# Patient Record
Sex: Female | Born: 1950 | Race: Black or African American | Hispanic: No | Smoking: Former smoker
Health system: Southern US, Community
[De-identification: ages and names within clinical notes are randomized; demographics above are authoritative.]

## PROBLEM LIST (undated history)

## (undated) ENCOUNTER — Emergency Department (HOSPITAL_COMMUNITY): Discharge status: Absent without leave | Payer: Medicare Other

## (undated) DIAGNOSIS — M542 Cervicalgia: Secondary | ICD-10-CM

## (undated) DIAGNOSIS — J449 Chronic obstructive pulmonary disease, unspecified: Secondary | ICD-10-CM

## (undated) DIAGNOSIS — I5033 Acute on chronic diastolic (congestive) heart failure: Secondary | ICD-10-CM

## (undated) DIAGNOSIS — E119 Type 2 diabetes mellitus without complications: Secondary | ICD-10-CM

## (undated) DIAGNOSIS — D649 Anemia, unspecified: Secondary | ICD-10-CM

## (undated) DIAGNOSIS — I313 Pericardial effusion (noninflammatory): Secondary | ICD-10-CM

## (undated) DIAGNOSIS — IMO0001 Reserved for inherently not codable concepts without codable children: Secondary | ICD-10-CM

## (undated) DIAGNOSIS — N189 Chronic kidney disease, unspecified: Secondary | ICD-10-CM

## (undated) DIAGNOSIS — I2510 Atherosclerotic heart disease of native coronary artery without angina pectoris: Secondary | ICD-10-CM

## (undated) DIAGNOSIS — E785 Hyperlipidemia, unspecified: Secondary | ICD-10-CM

## (undated) DIAGNOSIS — G8929 Other chronic pain: Secondary | ICD-10-CM

## (undated) DIAGNOSIS — I739 Peripheral vascular disease, unspecified: Secondary | ICD-10-CM

## (undated) DIAGNOSIS — E559 Vitamin D deficiency, unspecified: Secondary | ICD-10-CM

## (undated) DIAGNOSIS — K5790 Diverticulosis of intestine, part unspecified, without perforation or abscess without bleeding: Secondary | ICD-10-CM

## (undated) DIAGNOSIS — I639 Cerebral infarction, unspecified: Secondary | ICD-10-CM

## (undated) DIAGNOSIS — M1990 Unspecified osteoarthritis, unspecified site: Secondary | ICD-10-CM

## (undated) DIAGNOSIS — E1140 Type 2 diabetes mellitus with diabetic neuropathy, unspecified: Secondary | ICD-10-CM

## (undated) DIAGNOSIS — K222 Esophageal obstruction: Secondary | ICD-10-CM

## (undated) DIAGNOSIS — F419 Anxiety disorder, unspecified: Secondary | ICD-10-CM

## (undated) DIAGNOSIS — I10 Essential (primary) hypertension: Secondary | ICD-10-CM

## (undated) DIAGNOSIS — K219 Gastro-esophageal reflux disease without esophagitis: Secondary | ICD-10-CM

## (undated) DIAGNOSIS — Z794 Long term (current) use of insulin: Secondary | ICD-10-CM

## (undated) HISTORY — DX: Hyperlipidemia, unspecified: E78.5

## (undated) HISTORY — PX: OTHER SURGICAL HISTORY: SHX169

## (undated) HISTORY — PX: BACK SURGERY: SHX140

## (undated) HISTORY — PX: CARDIAC CATHETERIZATION: SHX172

## (undated) HISTORY — DX: Atherosclerotic heart disease of native coronary artery without angina pectoris: I25.10

## (undated) HISTORY — PX: PARTIAL HYSTERECTOMY: SHX80

## (undated) HISTORY — DX: Esophageal obstruction: K22.2

## (undated) HISTORY — PX: SHOULDER SURGERY: SHX246

## (undated) HISTORY — DX: Chronic obstructive pulmonary disease, unspecified: J44.9

## (undated) HISTORY — PX: ABDOMINAL HYSTERECTOMY: SHX81

## (undated) HISTORY — DX: Peripheral vascular disease, unspecified: I73.9

## (undated) HISTORY — PX: BLADDER SURGERY: SHX569

## (undated) HISTORY — DX: Essential (primary) hypertension: I10

---

## 1998-07-15 ENCOUNTER — Inpatient Hospital Stay (HOSPITAL_COMMUNITY): Admission: AD | Admit: 1998-07-15 | Discharge: 1998-07-17 | Payer: Self-pay | Admitting: Gastroenterology

## 1998-09-03 ENCOUNTER — Ambulatory Visit (HOSPITAL_COMMUNITY): Admission: RE | Admit: 1998-09-03 | Discharge: 1998-09-03 | Payer: Self-pay | Admitting: *Deleted

## 2000-09-11 ENCOUNTER — Encounter: Admission: RE | Admit: 2000-09-11 | Discharge: 2000-09-11 | Payer: Self-pay | Admitting: *Deleted

## 2000-09-11 ENCOUNTER — Encounter: Payer: Self-pay | Admitting: *Deleted

## 2000-09-27 ENCOUNTER — Encounter: Payer: Self-pay | Admitting: *Deleted

## 2000-09-27 ENCOUNTER — Encounter: Admission: RE | Admit: 2000-09-27 | Discharge: 2000-09-27 | Payer: Self-pay | Admitting: *Deleted

## 2001-12-12 ENCOUNTER — Other Ambulatory Visit: Admission: RE | Admit: 2001-12-12 | Discharge: 2001-12-12 | Payer: Self-pay | Admitting: *Deleted

## 2002-03-06 ENCOUNTER — Inpatient Hospital Stay (HOSPITAL_COMMUNITY): Admission: EM | Admit: 2002-03-06 | Discharge: 2002-03-11 | Payer: Self-pay | Admitting: Emergency Medicine

## 2002-03-06 ENCOUNTER — Encounter: Payer: Self-pay | Admitting: Cardiology

## 2002-03-08 ENCOUNTER — Encounter: Payer: Self-pay | Admitting: Cardiology

## 2002-03-10 ENCOUNTER — Encounter: Payer: Self-pay | Admitting: Cardiology

## 2002-04-22 ENCOUNTER — Ambulatory Visit (HOSPITAL_COMMUNITY): Admission: RE | Admit: 2002-04-22 | Discharge: 2002-04-23 | Payer: Self-pay | Admitting: *Deleted

## 2003-04-07 ENCOUNTER — Other Ambulatory Visit: Admission: RE | Admit: 2003-04-07 | Discharge: 2003-04-07 | Payer: Self-pay | Admitting: Family Medicine

## 2003-04-20 ENCOUNTER — Encounter: Payer: Self-pay | Admitting: Family Medicine

## 2003-04-20 ENCOUNTER — Encounter: Admission: RE | Admit: 2003-04-20 | Discharge: 2003-04-20 | Payer: Self-pay | Admitting: Family Medicine

## 2003-06-02 ENCOUNTER — Ambulatory Visit (HOSPITAL_COMMUNITY): Admission: RE | Admit: 2003-06-02 | Discharge: 2003-06-02 | Payer: Self-pay | Admitting: Gastroenterology

## 2004-10-19 ENCOUNTER — Ambulatory Visit: Payer: Self-pay | Admitting: Cardiology

## 2004-11-07 ENCOUNTER — Ambulatory Visit: Payer: Self-pay

## 2004-11-16 ENCOUNTER — Ambulatory Visit: Payer: Self-pay | Admitting: Cardiology

## 2004-11-29 ENCOUNTER — Ambulatory Visit: Payer: Self-pay | Admitting: Internal Medicine

## 2004-11-29 ENCOUNTER — Inpatient Hospital Stay (HOSPITAL_BASED_OUTPATIENT_CLINIC_OR_DEPARTMENT_OTHER): Admission: RE | Admit: 2004-11-29 | Discharge: 2004-11-29 | Payer: Self-pay | Admitting: Cardiology

## 2004-12-12 ENCOUNTER — Ambulatory Visit: Payer: Self-pay | Admitting: Cardiology

## 2005-02-10 ENCOUNTER — Ambulatory Visit: Payer: Self-pay | Admitting: Cardiology

## 2005-09-21 ENCOUNTER — Ambulatory Visit: Payer: Self-pay | Admitting: Cardiology

## 2006-07-04 ENCOUNTER — Ambulatory Visit: Payer: Self-pay | Admitting: Cardiology

## 2006-07-11 ENCOUNTER — Ambulatory Visit: Payer: Self-pay

## 2006-08-03 ENCOUNTER — Inpatient Hospital Stay (HOSPITAL_COMMUNITY): Admission: RE | Admit: 2006-08-03 | Discharge: 2006-08-04 | Payer: Self-pay | Admitting: Specialist

## 2006-09-10 ENCOUNTER — Encounter: Admission: RE | Admit: 2006-09-10 | Discharge: 2006-12-09 | Payer: Self-pay | Admitting: Specialist

## 2008-06-16 ENCOUNTER — Ambulatory Visit: Payer: Self-pay | Admitting: Cardiology

## 2008-06-16 ENCOUNTER — Inpatient Hospital Stay (HOSPITAL_COMMUNITY): Admission: EM | Admit: 2008-06-16 | Discharge: 2008-06-19 | Payer: Self-pay | Admitting: Emergency Medicine

## 2008-06-18 ENCOUNTER — Encounter: Payer: Self-pay | Admitting: Cardiology

## 2008-07-01 ENCOUNTER — Ambulatory Visit: Payer: Self-pay | Admitting: Cardiology

## 2009-02-04 ENCOUNTER — Encounter: Admission: RE | Admit: 2009-02-04 | Discharge: 2009-02-04 | Payer: Self-pay | Admitting: Family Medicine

## 2009-04-19 DIAGNOSIS — I25118 Atherosclerotic heart disease of native coronary artery with other forms of angina pectoris: Secondary | ICD-10-CM | POA: Insufficient documentation

## 2009-04-19 DIAGNOSIS — E1169 Type 2 diabetes mellitus with other specified complication: Secondary | ICD-10-CM | POA: Insufficient documentation

## 2009-04-19 DIAGNOSIS — E1121 Type 2 diabetes mellitus with diabetic nephropathy: Secondary | ICD-10-CM | POA: Insufficient documentation

## 2009-04-19 DIAGNOSIS — E785 Hyperlipidemia, unspecified: Secondary | ICD-10-CM

## 2009-04-19 DIAGNOSIS — E1159 Type 2 diabetes mellitus with other circulatory complications: Secondary | ICD-10-CM | POA: Insufficient documentation

## 2009-04-19 DIAGNOSIS — I10 Essential (primary) hypertension: Secondary | ICD-10-CM

## 2009-04-19 DIAGNOSIS — I2510 Atherosclerotic heart disease of native coronary artery without angina pectoris: Secondary | ICD-10-CM | POA: Insufficient documentation

## 2009-04-21 ENCOUNTER — Ambulatory Visit: Payer: Self-pay | Admitting: Cardiology

## 2009-08-11 ENCOUNTER — Encounter (INDEPENDENT_AMBULATORY_CARE_PROVIDER_SITE_OTHER): Payer: Self-pay | Admitting: *Deleted

## 2010-06-18 ENCOUNTER — Encounter
Admission: RE | Admit: 2010-06-18 | Discharge: 2010-06-18 | Payer: Self-pay | Admitting: Physical Medicine and Rehabilitation

## 2010-12-04 HISTORY — PX: CERVICAL SPINE SURGERY: SHX589

## 2010-12-21 ENCOUNTER — Encounter: Payer: Self-pay | Admitting: Cardiology

## 2010-12-21 ENCOUNTER — Ambulatory Visit
Admission: RE | Admit: 2010-12-21 | Discharge: 2010-12-21 | Payer: Self-pay | Source: Home / Self Care | Attending: Cardiology | Admitting: Cardiology

## 2010-12-26 ENCOUNTER — Encounter: Payer: Self-pay | Admitting: Cardiology

## 2010-12-26 ENCOUNTER — Ambulatory Visit: Admit: 2010-12-26 | Payer: Self-pay | Admitting: Vascular Surgery

## 2010-12-29 ENCOUNTER — Telehealth (INDEPENDENT_AMBULATORY_CARE_PROVIDER_SITE_OTHER): Payer: Self-pay | Admitting: *Deleted

## 2011-01-02 ENCOUNTER — Encounter: Payer: Self-pay | Admitting: Cardiology

## 2011-01-02 ENCOUNTER — Encounter: Payer: Self-pay | Admitting: *Deleted

## 2011-01-02 ENCOUNTER — Ambulatory Visit: Admission: RE | Admit: 2011-01-02 | Discharge: 2011-01-02 | Payer: Self-pay | Source: Home / Self Care

## 2011-01-02 ENCOUNTER — Encounter (HOSPITAL_COMMUNITY)
Admission: RE | Admit: 2011-01-02 | Discharge: 2011-01-03 | Payer: Self-pay | Source: Home / Self Care | Attending: Cardiology | Admitting: Cardiology

## 2011-01-04 ENCOUNTER — Encounter (INDEPENDENT_AMBULATORY_CARE_PROVIDER_SITE_OTHER): Payer: BC Managed Care – PPO

## 2011-01-04 DIAGNOSIS — M79609 Pain in unspecified limb: Secondary | ICD-10-CM

## 2011-01-05 NOTE — Assessment & Plan Note (Signed)
Summary: [facility]   Visit Type:  Follow-up Primary Provider:  Haynes Hoehn, NP  CC:  CAD.  History of Present Illness: The patient presents for follow up after having been gone for a couple of years.  In that time she has had no new cardiovascular findings though she has had difficult to control risk factors such as diabetes and dyslipidemia. I reviewed her most recent labs and her primary physicians are aggressively trying to treat these issues. I also note that her last creatinine was up to 1.65. She is now considering back surgery due to repetitive injuries. She still works. With repetitive motions she does notice some chest discomfort. However, she's not having this at rest. She's not having any PND or orthopnea though she does have increasing dyspnea with exertion. She denied having palpitations, presyncope or syncope.  Current Medications (verified): 1)  Nitroglycerin 0.4 Mg Subl (Nitroglycerin) .... As Directed For Chest Pain 2)  Aspirin 81 Mg  Tabs (Aspirin) .[name].. 1 By Mouth Daily 3)  Neurontin 300 Mg Caps (Gabapentin) .[name].. 1 By Mouth Three Times A Day 4)  Vitamin D3 50000 Unit Caps (Cholecalciferol) .[name].. 1 By Mouth Weekly 5)  Diovan Hct 320-25 Mg Tabs (Valsartan-Hydrochlorothiazide) .[name].. 1 By Mouth Daily 6)  Crestor 20 Mg Tabs (Rosuvastatin Calcium) .[name].. 1 By Mouth Daily 7)  Nifedipine 60 Mg Xr24h-Tab (Nifedipine) .[name].. 1 By Mouth Daily 8)  Onglyza 5 Mg Tabs (Saxagliptin Hcl) .[name].. 1 By Mouth Daily 9)  Actos 30 Mg Tabs (Pioglitazone Hcl) .[name].. 1 By Mouth Daily 10)  Atenolol 50 Mg Tabs (Atenolol) .[name].. 1 By Mouth Daily 11)  Glimepiride 4 Mg Tabs (Glimepiride) .[name].. 1 By Mouth Daily  Allergies (verified): 1)  ! Penicillin  Past History:  Past Medical History:  1. Coronary artery disease  (Stenting in 2005.  2009 cardiac catheterization.  This demonstrated questionable mitral regurgitation. Left main was free of critical disease, the LAD had mild plaquing, circumflex had  some mild luminal irregularities, there is ramus intermediate with 40% stenosis prior to the stent.  The stent itself was widely patent, the right coronary artery had 40% proximal stenosis. )  2. Esophageal stricture.   3. Non-insulin-dependent diabetes mellitus.   4. Hyperlipidemia.   5. Hypertension.   6. Bronchitis.   Past Surgical History: 1. Cardiac catheterization. 2. Bladder tack. 3. Partial hysterectomy. 4. Right shoulder surgery  Review of Systems       As stated in the HPI and negative for all other systems.   Vital Signs:  Patient profile:   60 year old female Height:      64 inches Weight:      200 pounds BMI:     34.45 Pulse rate:   59 / minute Resp:     16 per minute BP sitting:   126 / 66  (right arm)  Vitals Entered By: Levora Angel, CNA (December 21, 2010 3:57 PM)  Physical Exam  General:  Well developed, well nourished, in no acute distress. Head:  normocephalic and atraumatic Eyes:  PERRLA/EOM intact; conjunctiva and lids normal. Mouth:  Teeth, gums and palate normal. Oral mucosa normal. Neck:  Neck supple, no JVD. No masses, thyromegaly or abnormal cervical nodes. Chest Wall:  no deformities or breast masses noted Lungs:  Clear bilaterally to auscultation and percussion. Abdomen:  Bowel sounds positive; abdomen soft and non-tender without masses, organomegaly, or hernias noted. No hepatosplenomegaly. Msk:  Back normal, normal gait. Muscle strength and tone normal. Extremities:  No clubbing  or cyanosis. Neurologic:  Alert and oriented x 3. Skin:  Intact without lesions or rashes. Cervical Nodes:  no significant adenopathy Axillary Nodes:  no significant adenopathy Inguinal Nodes:  no significant adenopathy Psych:  Normal affect.   Detailed Cardiovascular Exam  Neck    Carotids: Carotids full and equal bilaterally without bruits.      Neck Veins: Normal, no JVD.    Heart    Inspection: no deformities or lifts noted.      Palpation: normal  PMI with no thrills palpable.      Auscultation: regular rate and rhythm, S1, S2 without murmurs, rubs, gallops, or clicks.    Vascular    Abdominal Aorta: no palpable masses, pulsations, or audible bruits.      Femoral Pulses: normal femoral pulses bilaterally.      Pedal Pulses: normal pedal pulses bilaterally.      Radial Pulses: normal radial pulses bilaterally.      Peripheral Circulation: no clubbing, cyanosis, or edema noted with normal capillary refill.     EKG  Procedure date:  12/21/2010  Findings:      NSR, rate 59. axis WNL, no acute ST T wave changes.  Impression & Recommendations:  Problem # 1:  CAD (ICD-414.00) The patient has a low functional level and uncontrolled risk factors.  She will be having back surgery.  Given this she needs stress testing.  She would not be able to walk on a treadmill and so she will have pharmacologic testing. Orders: EKG w/ Interpretation (93000) Nuclear Stress Test (Nuc Stress   Problem # 2:  HYPERTENSION, BENIGN (ICD-401.1) Her blood pressure is controlled and she will continue the meds as listed.  Problem # 3:  HYPERLIPIDEMIA (ICD-272.4) I reviewed her lipid profile.  This is not yet at target but it is being treated aggressively by her primary MD.  Other Orders: Nuclear Stress Test (Nuc Stress Test)  Patient Instructions: 1)  Your physician recommends that you schedule a follow-up appointment in: 1 yr with Dr Percival Spanish 2)  Your physician recommends that you continue on your current medications as directed. Please refer to the Current Medication list given to you today. 3)  Your physician has requested that you have an adenosine myoview.  For further information please visit [email or web address].  Please follow instruction sheet, as given.

## 2011-01-05 NOTE — Progress Notes (Signed)
Summary: Nuclear PRE PROCEDURE  Phone Note Outgoing Call   Call placed by: Loma Newton Summary of Call: Reviewed information on Myoview Information Sheet (see scanned document for further details).  Spoke with patient.     Nuclear Med Background Indications for Stress Test: Evaluation for Ischemia, Surgical Clearance, Stent Patency  Indications Comments: Pending back surgery  History: COPD, Echo, Heart Catheterization, Myocardial Perfusion Study, Stents  History Comments: '03 Stents 08/07 MPS NL EF 62% '09 Heart Cath Patent stent, mod MR '09 ECHO EF 55-65%  Symptoms: Chest Pain with Exertion, DOE    Nuclear Pre-Procedure Cardiac Risk Factors: Family History - CAD, History of Smoking, Hypertension, Lipids, NIDDM, Obesity, TIA Height (in): 49  Nuclear Med Study Referring MD:  J.Hochrein

## 2011-01-11 NOTE — Assessment & Plan Note (Signed)
Summary: Cardiology Nuclear Testing  Nuclear Med Background Indications for Stress Test: Evaluation for Ischemia, Surgical Clearance, Stent Patency  Indications Comments: Pending back or neck surgery by Dr. Leeroy Cha (Fax# [phone])  History: COPD, Echo, Heart Catheterization, Myocardial Perfusion Study, Stents  History Comments: '03 Stent-RI, OM-3; '07 [id number], EF=62%; '09 Cath:Patent stent, moderate MR; '09 Echo:EF=55-65%  Symptoms: Chest Tightness, Diaphoresis, DOE, Fatigue, Palpitations  Symptoms Comments: Last episode of [id number] week.   Nuclear Pre-Procedure Cardiac Risk Factors: Family History - CAD, History of Smoking, Hypertension, IDDM Type 2, Lipids, Obesity, TIA Caffeine/Decaff Intake: None NPO After: 8:00 PM Lungs: Clear.  O2 Sat 98% on RA. IV 0.9% NS with Angio Cath: 20g     IV Site: R Antecubital IV Started by: Irven Baltimore, RN Chest Size (in) 42     Cup Size DD     Height (in): 64 Weight (lb): 194 BMI: 33.42 Tech Comments: Held atenolol this am.  Nuclear Med Study 1 or 2 day study:  1 day     Stress Test Type:  Carlton Adam Reading MD:  Darlin Coco, MD     Referring MD:  Minus Breeding, MD Resting Radionuclide:  Technetium 23m Tetrofosmin     Resting Radionuclide Dose:  11 mCi  Stress Radionuclide:  Technetium 31m Tetrofosmin     Stress Radionuclide Dose:  33 mCi   Stress Protocol   Lexiscan: 0.4 mg   Stress Test Technologist:  Valetta Fuller, CMA-N     Nuclear Technologist:  Charlton Amor, CNMT  Rest Procedure  Myocardial perfusion imaging was performed at rest 45 minutes following the intravenous administration of Technetium 69m Tetrofosmin.  Stress Procedure  The patient received IV Lexiscan 0.4 mg over 15-seconds.  Technetium 32m Tetrofosmin injected at 30-seconds.  There were no significant changes with infusion.  She did c/o chest tightness.  Quantitative spect images were obtained after a 45 minute delay.  QPS Raw Data Images:  Normal;  no motion artifact; normal heart/lung ratio. Stress Images:  Normal homogeneous uptake in all areas of the myocardium. Rest Images:  Normal homogeneous uptake in all areas of the myocardium. Subtraction (SDS):  No evidence of ischemia. Transient Ischemic Dilatation:  1.06  (Normal <1.22)  Lung/Heart Ratio:  .42  (Normal <0.45)  Quantitative Gated Spect Images QGS EDV:  96 ml QGS ESV:  32 ml QGS EF:  66 %  Findings Normal nuclear study Clinically Abnormal (chest pain, ST abnormality, hypotension)      Overall Impression  Exercise Capacity: Lexiscan with no exercise. BP Response: Normal blood pressure response. Clinical Symptoms: Mild chest pain/dyspnea. ECG Impression: No significant ST segment change suggestive of ischemia. Overall Impression: Normal stress nuclear study. Overall Impression Comments: No wall motion abnormalities.  Appended Document: Cardiology Nuclear   No evidence of ischemia or infarct.  The patient would be at acceptable risk for the planned surgery.  Appended Document: Cardiology Nuclear Testing pt aware of results.

## 2011-01-13 NOTE — Procedures (Unsigned)
DUPLEX DEEP VENOUS EXAM - LOWER EXTREMITY  INDICATION:  Leg pain.  HISTORY:  Edema:  No. Trauma/Surgery:  No. Pain:  Yes. PE:  No. Previous DVT:  No. Anticoagulants:  Aspirin 81 mg. Other:  DUPLEX EXAM:               CFV   SFV   PopV  PTV    GSV               R  L  R  L  R  L  R   L  R  L Thrombosis    o  o  o  o  o  o  o   o  o  o Spontaneous   +  +  +  +  +  +  +   +  +  + Phasic        +  +  +  +  +  +  +   +  +  + Augmentation  +  +  +  +  +  +  +   +  +  + Compressible  +  +  +  +  +  +  +   +  +  + Competent     +  +  +  +  +  +  +   +  +  +  Legend:  + - yes  o - no  p - partial  D - decreased  IMPRESSION:  No evidence of acute deep venous thrombosis or superficial thrombophlebitis within bilateral lower extremities.   _____________________________ Nelda Severe Kellie Simmering, M.D.  OD/MEDQ  D:  01/04/2011  T:  01/04/2011  Job:  [id number]

## 2011-01-18 ENCOUNTER — Encounter: Payer: Self-pay | Admitting: Cardiology

## 2011-01-23 ENCOUNTER — Other Ambulatory Visit (HOSPITAL_COMMUNITY): Payer: BC Managed Care – PPO

## 2011-01-23 ENCOUNTER — Encounter: Payer: Self-pay | Admitting: Cardiology

## 2011-01-24 ENCOUNTER — Inpatient Hospital Stay (HOSPITAL_COMMUNITY): Payer: BC Managed Care – PPO

## 2011-01-24 ENCOUNTER — Inpatient Hospital Stay (HOSPITAL_COMMUNITY)
Admission: RE | Admit: 2011-01-24 | Discharge: 2011-02-01 | DRG: 865 | Disposition: A | Discharge status: Home Health | Payer: BC Managed Care – PPO | Source: Ambulatory Visit | Attending: Neurosurgery | Admitting: Neurosurgery

## 2011-01-24 ENCOUNTER — Encounter (HOSPITAL_COMMUNITY): Payer: BC Managed Care – PPO

## 2011-01-24 DIAGNOSIS — K219 Gastro-esophageal reflux disease without esophagitis: Secondary | ICD-10-CM | POA: Diagnosis present

## 2011-01-24 DIAGNOSIS — M5030 Other cervical disc degeneration, unspecified cervical region: Principal | ICD-10-CM | POA: Diagnosis present

## 2011-01-24 DIAGNOSIS — Z8673 Personal history of transient ischemic attack (TIA), and cerebral infarction without residual deficits: Secondary | ICD-10-CM

## 2011-01-24 DIAGNOSIS — IMO0001 Reserved for inherently not codable concepts without codable children: Secondary | ICD-10-CM | POA: Diagnosis present

## 2011-01-24 DIAGNOSIS — R5381 Other malaise: Secondary | ICD-10-CM | POA: Diagnosis present

## 2011-01-24 DIAGNOSIS — E119 Type 2 diabetes mellitus without complications: Secondary | ICD-10-CM | POA: Diagnosis present

## 2011-01-24 DIAGNOSIS — M129 Arthropathy, unspecified: Secondary | ICD-10-CM | POA: Diagnosis present

## 2011-01-24 LAB — CBC
HCT: 35.8 % — ABNORMAL LOW (ref 36.0–46.0)
Hemoglobin: 11.9 g/dL — ABNORMAL LOW (ref 12.0–15.0)
MCH: 27.5 pg (ref 26.0–34.0)
MCHC: 33.2 g/dL (ref 30.0–36.0)
MCV: 82.7 fL (ref 78.0–100.0)
Platelets: 258 10*3/uL (ref 150–400)
RBC: 4.33 MIL/uL (ref 3.87–5.11)
RDW: 15.4 % (ref 11.5–15.5)
WBC: 8.8 10*3/uL (ref 4.0–10.5)

## 2011-01-24 LAB — BASIC METABOLIC PANEL
BUN: 18 mg/dL (ref 6–23)
CO2: 27 mEq/L (ref 19–32)
Calcium: 9.3 mg/dL (ref 8.4–10.5)
Chloride: 109 mEq/L (ref 96–112)
Creatinine, Ser: 1.17 mg/dL (ref 0.4–1.2)
GFR calc Af Amer: 57 mL/min — ABNORMAL LOW (ref >60)
GFR calc non Af Amer: 48 mL/min — ABNORMAL LOW (ref >60)
Glucose, Bld: 194 mg/dL — ABNORMAL HIGH (ref 70–99)
Potassium: 4.1 mEq/L (ref 3.5–5.1)
Sodium: 142 mEq/L (ref 135–145)

## 2011-01-24 LAB — GLUCOSE, CAPILLARY
Glucose-Capillary: 176 mg/dL — ABNORMAL HIGH (ref 70–99)
Glucose-Capillary: 180 mg/dL — ABNORMAL HIGH (ref 70–99)
Glucose-Capillary: 257 mg/dL — ABNORMAL HIGH (ref 70–99)

## 2011-01-24 LAB — SURGICAL PCR SCREEN
MRSA, PCR: NEGATIVE
Staphylococcus aureus: NEGATIVE

## 2011-01-25 LAB — GLUCOSE, CAPILLARY
Glucose-Capillary: 242 mg/dL — ABNORMAL HIGH (ref 70–99)
Glucose-Capillary: 247 mg/dL — ABNORMAL HIGH (ref 70–99)
Glucose-Capillary: 253 mg/dL — ABNORMAL HIGH (ref 70–99)
Glucose-Capillary: 265 mg/dL — ABNORMAL HIGH (ref 70–99)
Glucose-Capillary: 195 mg/dL — ABNORMAL HIGH (ref 70–99)

## 2011-01-25 NOTE — Letter (Signed)
Summary: [facility]  [facility]   Imported By: Marilynne Drivers 01/16/2011 16:29:13  _____________________________________________________________________  External Attachment:    Type:   Image     Comment:   External Document

## 2011-01-26 LAB — GLUCOSE, CAPILLARY
Glucose-Capillary: 160 mg/dL — ABNORMAL HIGH (ref 70–99)
Glucose-Capillary: 196 mg/dL — ABNORMAL HIGH (ref 70–99)
Glucose-Capillary: 324 mg/dL — ABNORMAL HIGH (ref 70–99)
Glucose-Capillary: 178 mg/dL — ABNORMAL HIGH (ref 70–99)

## 2011-01-27 DIAGNOSIS — M47812 Spondylosis without myelopathy or radiculopathy, cervical region: Secondary | ICD-10-CM

## 2011-01-27 DIAGNOSIS — M5412 Radiculopathy, cervical region: Secondary | ICD-10-CM

## 2011-01-27 DIAGNOSIS — M47817 Spondylosis without myelopathy or radiculopathy, lumbosacral region: Secondary | ICD-10-CM

## 2011-01-27 LAB — GLUCOSE, CAPILLARY
Glucose-Capillary: 189 mg/dL — ABNORMAL HIGH (ref 70–99)
Glucose-Capillary: 226 mg/dL — ABNORMAL HIGH (ref 70–99)
Glucose-Capillary: 201 mg/dL — ABNORMAL HIGH (ref 70–99)
Glucose-Capillary: 136 mg/dL — ABNORMAL HIGH (ref 70–99)

## 2011-01-28 LAB — GLUCOSE, CAPILLARY
Glucose-Capillary: 200 mg/dL — ABNORMAL HIGH (ref 70–99)
Glucose-Capillary: 223 mg/dL — ABNORMAL HIGH (ref 70–99)
Glucose-Capillary: 167 mg/dL — ABNORMAL HIGH (ref 70–99)
Glucose-Capillary: 167 mg/dL — ABNORMAL HIGH (ref 70–99)

## 2011-01-29 LAB — GLUCOSE, CAPILLARY
Glucose-Capillary: 184 mg/dL — ABNORMAL HIGH (ref 70–99)
Glucose-Capillary: 208 mg/dL — ABNORMAL HIGH (ref 70–99)
Glucose-Capillary: 245 mg/dL — ABNORMAL HIGH (ref 70–99)
Glucose-Capillary: 169 mg/dL — ABNORMAL HIGH (ref 70–99)

## 2011-01-30 LAB — GLUCOSE, CAPILLARY
Glucose-Capillary: 211 mg/dL — ABNORMAL HIGH (ref 70–99)
Glucose-Capillary: 244 mg/dL — ABNORMAL HIGH (ref 70–99)
Glucose-Capillary: 186 mg/dL — ABNORMAL HIGH (ref 70–99)
Glucose-Capillary: 170 mg/dL — ABNORMAL HIGH (ref 70–99)

## 2011-01-31 LAB — GLUCOSE, CAPILLARY
Glucose-Capillary: 193 mg/dL — ABNORMAL HIGH (ref 70–99)
Glucose-Capillary: 100 mg/dL — ABNORMAL HIGH (ref 70–99)
Glucose-Capillary: 190 mg/dL — ABNORMAL HIGH (ref 70–99)
Glucose-Capillary: 85 mg/dL (ref 70–99)

## 2011-02-01 LAB — GLUCOSE, CAPILLARY
Glucose-Capillary: 151 mg/dL — ABNORMAL HIGH (ref 70–99)
Glucose-Capillary: 227 mg/dL — ABNORMAL HIGH (ref 70–99)
Glucose-Capillary: 165 mg/dL — ABNORMAL HIGH (ref 70–99)

## 2011-02-09 NOTE — Letter (Signed)
Summary: [facility]  [facility]   Imported By: Marilynne Drivers 02/02/2011 16:19:04  _____________________________________________________________________  External Attachment:    Type:   Image     Comment:   External Document

## 2011-02-14 NOTE — Letter (Signed)
Summary: [facility]  [facility]   Imported By: Marilynne Drivers 02/09/2011 17:07:13  _____________________________________________________________________  External Attachment:    Type:   Image     Comment:   External Document

## 2011-02-14 NOTE — H&P (Signed)
  [name], STRODTMAN               ACCOUNT NO.:  [id number]  MEDICAL RECORD NO.:  [id number]           PATIENT TYPE:  I  LOCATION:  3022                         FACILITY:  [facility]  PHYSICIAN:  Leeroy Cha, M.D.   DATE OF BIRTH:  [birth year]  DATE OF ADMISSION:  01/24/2011 DATE OF DISCHARGE:                             HISTORY & PHYSICAL   Ms. [name] is a lady who had been complaining of back pain and neck pain.  This lady many years ago underwent L4-5 laminotomy by an orthopedic surgeon.  Since then, she had been complaining of pain going to both legs.  Lately, although the back pain has improved somewhat, nevertheless, she is complaining of pain in her neck with radiation to both upper extremities.  She has had conservative treatment.  This problem with the cervical spine had been going on for almost 3 years and she is not any better.  She has had 2 MRIs and because of the findings, she is being admitted for surgery.  PAST MEDICAL HISTORY:  Surgery of the left shoulder, stent in the heart, and L4-5 laminectomy lumbar.  ALLERGIES:  The patient is allergic to PENICILLIN.  MEDICATIONS:  She is taking Actos, atenolol, Lovaza, Crestor, and vitamins.  FAMILY HISTORY:  Unremarkable.  SOCIAL HISTORY:  The patient drinks socially.  Does not smoke.  She is 5 feet and 4 inches and 185 pounds.  REVIEW OF SYSTEMS:  Positive for neck pain, pain in the arm, back pain worsening to the legs, high blood pressure, high cholesterol.  PHYSICAL EXAMINATION:  HEAD, EYES, EARS, NOSE, AND THROAT:  Normal. NECK:  She has had multiple scars from previous infection.  She has a decreased flexibility secondary to pain. CARDIOVASCULAR:  Unremarkable. LUNGS:  There is mild rhonchi bilaterally. ABDOMEN:  Unremarkable. EXTREMITIES:  Normal pulse. NEURO:  Mental status unremarkable.  Cranial nerves unremarkable. Strength, she has weakness of both legs.  Positive dorsiflexion.  She has weakness of the  deltoid and biceps.  Reflexes 1+.  Sensation normal.  Cervical spine MRI shows spondylosis with bilateral foraminal narrow at the level of cervical 3-4, 4-5, and 5-6.  Lumbar spine, she has a history of degenerative disk disease with stenosis.  CLINICAL IMPRESSION:  Cervical spondylosis with chronic radiculopathy, chronic back pain.  RECOMMENDATIONS:  The patient is being admitted for surgery.  Procedure will be decompression of the level of C3-4, 4-5, 5-6 with fusion using a plate and graft.  The patient knows about the risks of infection, CSF leak, worsening pain, paralysis, need for further surgery.  She is also aware that the surgery does not guarantee the chronic lumbar condition.          ______________________________ Leeroy Cha, M.D.     EB/MEDQ  D:  01/24/2011  T:  01/24/2011  Job:  [id number]  Electronically Signed by Leeroy Cha M.D. on 02/14/2011 05:47:26 PM

## 2011-02-14 NOTE — Op Note (Signed)
[name], [name]               ACCOUNT NO.:  [id number]  MEDICAL RECORD NO.:  [id number]           PATIENT TYPE:  I  LOCATION:  3022                         FACILITY:  [facility]  PHYSICIAN:  Leeroy Cha, M.D.   DATE OF BIRTH:  21-Aug-1951  DATE OF PROCEDURE:  01/24/2011 DATE OF DISCHARGE:                              OPERATIVE REPORT   ADMISSION DIAGNOSES:  Cervical spondylosis C3-4, C4-5, and C5-6 with chronic radiculopathy, degenerative disk disease of lumbar spine.  POSTOPERATIVE DIAGNOSIS:  Cervical spondylosis C3-4, C4-5, and C5-6 with chronic radiculopathy, degenerative disk disease of lumbar spine.  PROCEDURE:  Anterior C3-4, C4-5, and C5-6 diskectomy, decompression of the spinal cord, bilateral foraminotomy, interbody fusion with allograft and autograft, plate, microscope.  SURGEON:  Leeroy Cha, MD  ASSISTANT:  Ashok Pall, MD  CLINICAL HISTORY:  [name] is a 60 year old female complaining of neck pain with radiation to both upper extremities for several years. Also, she had been complaining of back pain.  In the past, she had surgery by one of the orthopedic surgeons.  Lately, she came to my office telling me that the neck pain is killing her.  Clinically, she had weakness of the deltoid and biceps.  X-rays show severe foraminal stenosis at the level of C3-4, C4-5, and C5-6.  She also has degenerative disk disease with stenosis at the level of lumbar spine L4- 5.  Surgery was advised.  The patient knew all the risk including the fact that the neck surgery will not improve her lumbar spine.  PROCEDURE:  The patient was taken to the OR and after intubation, the left side of the neck was cleaned with DuraPrep.  A transverse incision was made through the skin, subcutaneous tissue, and platysma down to cervical spine.  X-rays showed that we were at the level C3-C4.  Then, we identified the C3-4, C4-5, and C5-6.  The patient had large osteophyte anteriorly  which were removed.  We started at the level of C3- 4 where total diskectomy was done.  The posterior ligament was opened. Decompression of the spinal cord as well foraminotomy was achieved bilaterally.  The left side was worse than right side.  At the level of C4-5 and C5-6, we found severe degenerative disk disease to the point there was almost no space.  We had to drill our way to the posterior ligament.  This was calcified, was opened in the midline, and decompression of the spinal cord as well as foraminotomy of C5 and C6 was done bilaterally.  At the end, we had plenty of space not only for the spinal cord but also for the C4, C5, and C6 nerve roots.  Then, an allograft of 10-mm lordotic at the level C3-4 and 7 mm at the level of C4-5 and C5-6 were introduced with autograft inside.  This was followed with a plate using 8 screws.  Lateral cervical spine showed that the upper part of the plate and rod were in good position.  Hemostasis was achieved but nevertheless, we left drain in the precervical area.  The wound was closed with Vicryl and Steri-Strips.  ______________________________ Leeroy Cha, M.D.     EB/MEDQ  D:  01/24/2011  T:  01/25/2011  Job:  [id number]  Electronically Signed by Leeroy Cha M.D. on 02/14/2011 05:47:28 PM

## 2011-02-23 NOTE — Discharge Summary (Signed)
  [name], [name]               ACCOUNT NO.:  [id number]  MEDICAL RECORD NO.:  [id number]           PATIENT TYPE:  I  LOCATION:  3022                         FACILITY:  [facility]  PHYSICIAN:  Ashok Pall, M.D.     DATE OF BIRTH:  August 28, 1951  DATE OF ADMISSION:  01/24/2011 DATE OF DISCHARGE:  02/01/2011                              DISCHARGE SUMMARY   ADMITTING DIAGNOSIS:  Cervical spondylosis with myelopathy.  PROCEDURES:  Anterior cervical decompression and arthrodesis at C3-4, C4- 5, and C5-6 with Globus instrumentation.  COMPLICATIONS:  None.  DISCHARGE STATUS:  Alive and well, weakness in upper extremities, which was preoperative finding.  Wound is clean, dry, and no signs of infection.  She is swallowing well at discharge, had had some problems during her inpatient stay.  Decadron was able to clear that up.  She is doing well, will have home health PT, OT.  She did receive both therapies while in the hospital.  No discharge medications.  She already has pain medication for home.  She will see Dr. Joya Salm in 3-4 weeks. She will be discharged to home.  There were no complications.          ______________________________ Ashok Pall, M.D.     KC/MEDQ  D:  02/01/2011  T:  02/02/2011  Job:  [id number]  Electronically Signed by Ashok Pall M.D. on 02/23/2011 02:32:36 PM

## 2011-03-29 ENCOUNTER — Encounter: Payer: Self-pay | Admitting: Nurse Practitioner

## 2011-03-29 DIAGNOSIS — K222 Esophageal obstruction: Secondary | ICD-10-CM | POA: Insufficient documentation

## 2011-03-29 DIAGNOSIS — E1140 Type 2 diabetes mellitus with diabetic neuropathy, unspecified: Secondary | ICD-10-CM

## 2011-03-29 DIAGNOSIS — M19012 Primary osteoarthritis, left shoulder: Secondary | ICD-10-CM | POA: Insufficient documentation

## 2011-03-29 DIAGNOSIS — K5790 Diverticulosis of intestine, part unspecified, without perforation or abscess without bleeding: Secondary | ICD-10-CM

## 2011-03-29 DIAGNOSIS — M542 Cervicalgia: Secondary | ICD-10-CM | POA: Insufficient documentation

## 2011-03-29 DIAGNOSIS — E559 Vitamin D deficiency, unspecified: Secondary | ICD-10-CM | POA: Insufficient documentation

## 2011-04-05 ENCOUNTER — Ambulatory Visit: Payer: BC Managed Care – PPO | Admitting: Physical Therapy

## 2011-04-18 NOTE — Assessment & Plan Note (Signed)
[place]                            CARDIOLOGY OFFICE NOTE   MAXIMA, SCHMELTZ                      MRN:          [id number]  DATE:04/21/2009                            DOB:          [birth year]    PRIMARY CARE Zachory Mangual:  Haynes Hoehn, NP   REASON FOR PRESENTATION:  Evaluate the patient with coronary artery  disease.   HISTORY OF PRESENT ILLNESS:  The patient is 60 years old.  She presents  for evaluation of her known coronary disease.  She is to undergo  injections for low back pain.  Dr. Joya Salm wanted to make sure that she  is to come off aspirin for 3 days before this.  She may need to have  back surgery but she is trying to put this off.   Since I last saw her, she continues to get episodes of shortness of  breath.  They seem to happen sporadically.  She is not particularly  active because of her back pain.  She does try to do her housework.  She  has to stop sometimes because she will get short of breath.  This passes  quickly when she stops what she is doing.  She will occasionally get  short of breath without activity, but she is not describing classic PND  or orthopnea.  She actually sleeps pretty well through the night.  The  episodes of shortness of breath that happened at rest seem to be short  lived, lasting for a minute or so.  She does not describe chest pressure  with these, or neck or arm discomfort.  She does not really notice any  palpitation, presyncope, or syncope.   PAST MEDICAL HISTORY:  Coronary artery disease (status post stenting in  2005.  The last catheterization in 2009 demonstrated LAD, mild plaque,  circumflex, mild luminal irregularities, ramus intermediate 40% stenosis  prior to the stent with a widely patent stent; the right coronary artery  had 40% stenosis.  Right heart catheterization demonstrated a wedge of  22 with a mean pulmonary artery pressure of 35), esophageal stricture,  non-insulin dependent  diabetes mellitus, hyperlipidemia, hypertension,  bronchitis, bladder tack, hysterectomy, right shoulder surgery.   ALLERGIES:  PENICILLIN, CODEINE, and PLAVIX.   MEDICATIONS:  1. Atenolol 50 mg b.i.d.  2. Aspirin 81 mg daily.  3. Glyburide/metformin 2.5/500 two pills b.i.d.  4. Vitamin D.  5. Actos 45 mg daily.  6. Crestor 20 mg daily.  7. Furosemide 20 mg daily.  8. Diovan HCT 320/25 daily.  9. Procardia 60 mg daily.  10.Amrix 15 mg at bedtime p.r.n.  11.Sareva 100 mg b.i.d.   REVIEW OF SYSTEMS:  As stated in the HPI, and otherwise negative for  other systems.   PHYSICAL EXAMINATION:  GENERAL:  The patient is in no distress.  VITAL SIGNS:  Blood pressure 132/72, heart rate 71 and regular, weight  186 pounds, body mass index 30.  HEENT:  Eyes unremarkable; pupils equal, round, and reactive to light;  fundi not visualized, oral mucosa unremarkable.  NECK:  No jugular venous distension at 45 degrees, carotid  upstroke  brisk and symmetrical.  No bruits.  No thyromegaly.  LYMPHATICS:  No cervical, axillary, inguinal adenopathy.  LUNGS:  Clear to auscultation bilaterally.  BACK:  No costovertebral angle tenderness.  CHEST:  Unremarkable.  HEART:  PMI not displaced or sustained.  S1 and S2 are within normal  limits.  No S3, no S4.  No clicks, no rubs, no murmurs.  ABDOMEN:  Obese, positive bowel sounds, normal in frequency and pitch.  No bruits, no rebound, no guarding.  No midline pulsatile mass.  No  hepatomegaly.  No splenomegaly.  SKIN:  No rashes, no nodules.  EXTREMITIES:  Pulses 2+ throughout, no cyanosis, no clubbing, no edema.  NEURO:  Oriented to person, place, and time.  Cranial nerves II through  XII grossly intact.  Motor grossly intact.   EKG, sinus rhythm, rate 71, right axis, QT prolonged, old anteroseptal  infarct, axis deviation and high lateral Q waves probably represent limb  lead reversal.   ASSESSMENT AND PLAN:  1. The patient is to have spinal  injections.  It is reasonable for her      to come off the aspirin, pending this per Dr. Joya Salm.  She should      restart it when he feels it is safe from a neurosurgical      standpoint.  If she has any open back procedures, I would request      consultation and most likely order stress perfusion imaging before      I would clear her from a cardiovascular standpoint.  She does have      some symptoms and multiple ongoing risk factors.  2. Dyspnea.  This seems to be atypical.  It happens sporadically and      goes away very quickly.  She does have elevated right-sided heart      pressures with an elevated wedge, seems to be euvolemic, and I      think the current dose of diuretic is the appropriate dose.      However, we might need the dose suggest this if she has symptoms      increasing in the future.  3. Hypertension.  Blood pressure is well controlled on a complicated      regimen, but she seems to be compliant with this.  4. Coronary artery disease as described above.  She needs continued      risk reduction.  5. Dyslipidemia.  Her last LDL was 196! with an HDL of 47.  She      understands that this is not at all well controlled.  She was      seeing the pharmacist at [facility], and they      are following this closely.  6. Followup.  I would like to see her back in about 6 months or sooner      if needed.     Minus Breeding, MD, [facility]  Electronically Signed    JH/MedQ  DD: 04/21/2009  DT: 04/22/2009  Job #: [id number]   cc:   Leeroy Cha, M.D.  Haynes Hoehn, NP

## 2011-04-18 NOTE — Discharge Summary (Signed)
[name]               ACCOUNT NO.:  [id number]   MEDICAL RECORD NO.:  [id number]          PATIENT TYPE:  INP   LOCATION:  [id number]                         FACILITY:  [facility]   PHYSICIAN:  Bruce R. Olevia Perches, MD, FACCDATE OF BIRTH:  [birth year]   DATE OF ADMISSION:  06/16/2008  DATE OF DISCHARGE:  06/19/2008                               DISCHARGE SUMMARY   PROCEDURES:  1. Right heart catheterization.  2. Left heart catheterization.  3. Coronary arteriogram.  4. Left ventriculogram.  5. Barium swallow.  6. Two-view chest x-ray.  7. A 2-D echocardiogram.   PRIMARY/FINAL DISCHARGE DIAGNOSIS:  Chest pain.   SECONDARY DIAGNOSES:  1. Status post percutaneous intervention in 2003 with percutaneous      transluminal coronary angioplasty to the ramus intermedius branch      and Cypher drug-eluting stent.  2. Nonobstructive coronary artery disease by cath at this admission      with 40% ramus intermedius prior to the previously placed stent,      which is patent and 40%-50% in the right coronary artery.  3. Preserved left ventricular ejection fraction with an ejection      fraction of 55%-65% by echocardiogram with high left ventricular      filling pressures and mild thickening of the mitral valve with      trivial mitral regurgitation as well as minimal pericardial      effusion.  PAS 18 at echo.  4. Diabetes.  5. Hypertension.  6. Hyperlipidemia.  7. Obesity.   TIME OF DISCHARGE:  42 minutes.   HOSPITAL COURSE:  [name] is a 60 year old female with known coronary  artery disease.  She had chest pain, which started the night before  admission.  It was prolonged and did not resolve.  She came to the  emergency room where she was admitted for further evaluation.   Her cardiac enzymes were negative for MI.  Because of the persistence of  her chest pain as well as shortness of breath, she had a right and left  heart cath.  This showed nonobstructive coronary artery disease,  but her  wedge was elevated at 22.  Pulmonary artery pressures were 49/22 with a  mean of 35 __________.  An echocardiogram was ordered because she  appeared to have a great deal of MR; however, on echocardiogram, her MR  was not clinically significant and her EF was normal.  She had elevated  left ventricular end-diastolic pressures, but pulmonary artery pressures  at echo were within normal limits.   There was a concern because of a history of esophageal tear that her  symptoms were GI in origin.  A barium swallow was within normal limits  and her symptoms resolved, so no further inpatient workup is indicated.  Dr. Olevia Perches reviewed the data and felt that she needed additional  diuretic therapy, so a low dose of Lasix was added to the Diovan HCTZ,  she had been taking prior to admission.  She is to get labs checked next  week with Dr. Elyse Hsu because of concern that she might need potassium  supplementation.  By June 19, 2008, [name]'s chest pain had resolved and her shortness  of breath was greatly improved.  Dr. Olevia Perches evaluated her and felt that  she was stable for discharge with close outpatient followup.   LABORATORY VALUES:  Hemoglobin A1c is 7.0, D-dimer less than 0.22, total  cholesterol 173, triglycerides 95, HDL 57, LDL 97, BUN 19, creatinine  1.12, glucose fasting 130, and potassium 3.3 (supplemented).   DISCHARGE INSTRUCTIONS:  Her activity level is to be increased  gradually.  She is to call our office for any problems with the cath  site.  She is to stick to a low-fat diabetic diet.  She is to keep her  appointment with Dr. Elyse Hsu next Thursday and followup with Dr.  Percival Spanish the week after that.  She is to follow up with [place] as needed.   DISCHARGE MEDICATIONS:  1. Atenolol 50 mg a day.  2. Aspirin 81 mg daily.  3. Glucovance 2.5/500 mg, 2 tablets b.i.d.  4. Crestor 20 mg daily.  5. Actos 45 mg a day.  6. Diovan HCTZ 320/25  mg daily.       Tamara Ferries, PA-C      Bruce R. Olevia Perches, MD, [facility]  Electronically Signed    RB/MEDQ  D:  06/19/2008  T:  06/20/2008  Job:  [id number]   cc:   Juanda Bond. [place], M.D.  Dr. Shanon Rosser

## 2011-04-18 NOTE — Cardiovascular Report (Signed)
[name], [name]               ACCOUNT NO.:  [id number]   MEDICAL RECORD NO.:  [id number]          PATIENT TYPE:  INP   LOCATION:  [id number]                         FACILITY:  [facility]   PHYSICIAN:  Minus Breeding, MD, FACCDATE OF BIRTH:  13-Sep-1951   DATE OF PROCEDURE:  DATE OF DISCHARGE:                            CARDIAC CATHETERIZATION   INDICATIONS:  [name] is a 60 year old who presents with recurrent  chest pain and shortness of breath.  She was seen by Dr. Olevia Perches and  referred to the cardiac catheterization for further evaluation.   PROCEDURE:  1. Right and left heart catheterization.  2. Selective coronary arteriography.  3. Selective left ventriculography.   DESCRIPTION OF PROCEDURE:  The patient was brought to the cath lab and  prepped and draped in the usual fashion.  Our plan was initially to do a  left heart catheterization.  After an anterior puncture, the femoral  artery on the left was entered.  We had difficulty gaining access on the  right.  We used a Smart needle.  Access was obtained and a 5-French  femoral sheath placed.  We then performed views of the left and right  coronary arteries.  Central aortic and left ventricular pressures were  measured.  Pigtail ventriculography was then performed in the RAO  projection.  Angiographically, there appeared to be more than  insignificant mitral regurgitation as a result, we elected at this point  to perform a right heart catheterization.  I discussed this with the  patient.  She was agreeable.  We then used a puncture needle to enter  the femoral vein after a localized Xylocaine.  Following this, superior  vena cava saturations were obtained and then serial pressures obtained  through the right heart.  PA saturation was obtained and aortic  saturation was obtained.  Thermodilution cardiac outputs were then  performed.  The right heart catheterization was subsequently completed.  All catheters were subsequently  removed.  The femoral sheaths were  secured with a bandage, and she was taken to the holding area for sheath  removal.  There were no major complications.   HEMODYNAMIC DATA:  1. Central aortic pressure 171/78, mean 113.  2. Left ventricular pressure 167/22.  3. No gradient pullback across aortic valve.  4. Pulmonary capillary wedge mean 22 V equals 30-35.  5. Pulmonary artery 49/22, mean 35.  6. RV 50/9.  7. RA 9.  8. Pulmonary artery saturation 65%.  9. Superior vena cava saturation 65%.  10.Aortic saturation 90%.  11.Thermodilution cardiac output 6.2 L per minute.  12.Thermodilution cardiac index 3.3 L per minute per meter squared.  13.Fick cardiac output 6.68 L per minute.  14.Fick cardiac index 3.46 L per minute per meter squared.   ANGIOGRAPHIC DATA:  1. Ventriculography was done in the RAO projection.  Overall, the      ventricular function appears to be vigorous, but does, however,      appear to be at least moderate mitral regurgitation and it could be      more severe.  It is difficult to quantitate, rough estimation would  be 2 to 3+.  2. The left main is free of critical disease.  3. The left anterior descending artery courses to the apex.  There is      a fair amount of mild luminal irregularity throughout the mid and      distal vessel.  It is calcified as well.  There is perhaps mild      plaquing also at the bifurcation, but no evidence of high-grade      stenosis.  4. The ramus intermedius vessel demonstrates patency with about 40%      narrowing prior to the stent.  The stent itself appears to be      patent.  5. The circumflex demonstrates a fair amount of luminal      irregularities, but no evidence of high-grade focal obstruction.  6. The right coronary artery demonstrates some hypodensity throughout      the proximal and mid vessel with an estimate of narrowing of about      40% to perhaps up to 50%.  Critical stenosis, however, does not      appear  to be present.   CONCLUSIONS:  1. Angiographic mitral regurgitation.  2. Hypertension.  3. Continued patency of the ramus intermedius stent.  4. Moderate disease of the mid proximal and mid right coronary.   DISPOSITION:  The patient may will need diuresis and/or transesophageal  echocardiogram.  To better assess, we will get a standard 2-D echo in  the morning and set her up for a TEE.      [name]. Tamara Foyer, MD, [name]   Electronically Signed     ______________________________  Minus Breeding, MD, [facility]    TDS/MEDQ  D:  06/17/2008  T:  06/18/2008  Job:  [id number]   cc:   [name]. Olevia Perches, MD, [facility]

## 2011-04-18 NOTE — Assessment & Plan Note (Signed)
[place] OFFICE NOTE   [name], [name]                      MRN:          [id number]  DATE:07/01/2008                            DOB:          05-21-1951    PRIMARY:  Haynes Hoehn, NP, [facility]   REASON FOR PRESENTATION:  Evaluate the patient with chest pain and  recent hospitalization.   HISTORY OF PRESENT ILLNESS:  The patient is 60 years old.  She was  hospitalized on June 16, 2008 with chest discomfort and shortness of  breath.  She did rule out for myocardial infarction.  She had a cardiac  catheterization.  This demonstrated questionable mitral regurgitation.  Left main was free of critical disease, the LAD had mild plaquing,  circumflex had some mild luminal irregularities, there is ramus  intermediate with 40% stenosis prior to the stent.  The stent itself was  widely patent, the right coronary artery had 40% proximal stenosis.  A  right heart catheterization demonstrated well-preserved ejection  fraction.  She did have a slightly elevated wedge pressure of 22.  Her  mean pulmonary artery pressure was actually 35.  The left ventricular  end-diastolic pressure was slightly elevated at 22.  The patient did  have Lasix added to her regimen and was discharged.  Since then, she has  had none of these acute episodes.  She has had no PND or orthopnea.  She  has had no chest discomfort.  She has had no palpitation, presyncope, or  syncope.   PAST MEDICAL HISTORY:  1. Coronary artery disease, status post stenting in 2005.  2. Esophageal stricture.  3. Non-insulin-dependent diabetes mellitus.  4. Hyperlipidemia.  5. Hypertension.  6. Bronchitis.  7. Bladder tack.  8. Hysterectomy.  9. Right shoulder surgery.   ALLERGIES AND INTOLERANCES:  PENICILLIN and CODEINE.  The patient also  had a rash and itching on PLAVIX.   MEDICATIONS:  1. Atenolol 50 mg b.i.d.  2. Aspirin.  3. Lovaza 2 b.i.d.  4. Byetta 10 mg daily.  5. Vitamin D.  6. Actos 45 mg daily.  7. Amlodipine 10 mg daily.  8. Crestor 20 mg daily.  9. Furosemide 20 mg daily.  10.Diovan HCT 320/25 daily.   REVIEW OF SYSTEMS:  As stated in the HPI and otherwise negative for  other systems.   PHYSICAL EXAMINATION:  GENERAL:  The patient is in no distress.  VITAL SIGNS:  Blood pressure 128/70, heart rate 62 and regular, and  weight 196 pounds.  NECK:  No jugular venous distention at 45 degrees.  Carotid upstroke  brisk and symmetrical.  No bruits, thyromegaly.  LYMPHATICS:  No adenopathy.  LUNGS:  Clear to auscultation bilaterally.  HEART:  PMI not displaced or sustained, S1 and S2 within normal limits.  No S3, no S4, no clicks, no rubs, no murmurs. ABDOMEN:  Obese, positive  bowel sounds, normal in frequency and pitch.  No bruits, no rebound, no  guarding, no midline pulsatile mass.  No hepatomegaly, no splenomegaly.  SKIN:  No rashes, no nodules.  EXTREMITIES:  Pulses  are 2+ throughout.  No edema, no cyanosis, no  clubbing.  NEURO:  Oriented to person, place, and time.  Cranial nerves II-XII  grossly intact.  Motor grossly intact.   EKG, sinus rhythm, rate 62, axis within normal limits, intervals within  normals, poor anterior R-wave progression, no acute ST-T wave changes.   ASSESSMENT AND PLAN:  1. Coronary artery disease.  The patient has nonobstructive residual      coronary artery disease as described.  No further cardiovascular      testing is suggested.  She will continue with the risk reduction.  2. Dyspnea.  She did have some mildly elevated pulmonary pressures      related predominately to her elevated end-diastolic pressure. An      echocardiogram did not suggest mitral regurgitation, which was      suggested by the cath.  However, she has responded to diuresis and      she should remain on the Lasix.  She needs to avoid salt and have      good blood pressure control.  3.  Hypertension.  Blood pressure is well controlled and she will      continue medications as listed.  4. Dyslipidemia.  The patient had reasonable lipid profile with an HDL      of 57, though her LDL was slightly elevated at 97.  She was      switched to Crestor 20 mg.  I think the goal should be an LDL less      than 70.  This can be checked again per Haynes Hoehn in about 4      weeks or so or at her next visit.  5. Muscle pains.  The patient has diffuse muscle pains and wonders      whether she might have      fibromyalgia.  I have asked her to discuss this with Haynes Hoehn.  6. Followup.  I will see the patient again in 6 months or sooner if      needed.     Minus Breeding, MD, [facility]  Electronically Signed    JH/MedQ  DD: 07/01/2008  DT: 07/02/2008  Job #: [id number]   cc:   Haynes Hoehn, NP

## 2011-04-18 NOTE — H&P (Signed)
[name], [name]               ACCOUNT NO.:  [id number]   MEDICAL RECORD NO.:  [id number]          PATIENT TYPE:  INP   LOCATION:  [id number]                         FACILITY:  [facility]   PHYSICIAN:  Karma Lew, MD          DATE OF BIRTH:  26-Nov-1951   DATE OF ADMISSION:  06/16/2008  DATE OF DISCHARGE:                              HISTORY & PHYSICAL   CHIEF COMPLAINT:  Chest pain x 18 hours.   HISTORY OF PRESENTING ILLNESS:  The patient is a 60 year old African  American female with a history of coronary artery disease status post  PTCA to a ramus intermedius branch, and a PCI with Cypher drug-eluting  stent in 2003 with complaints of chest discomfort this started last  night around 0300.  The patient reports that her chest discomfort  persisted until approximately 5 o'clock today.  She initially presented  to her primary care's office who then referred her to [facility]  Emergency Department.  Reports that the chest discomfort went away with  1 nitroglycerin here in the ED.  She reports that she has had a very  little chest discomfort since her last left heart catheterization in  2005 which revealed nonobstructive coronary artery disease.  She states  today that the pain was increased in intensity and the length of  symptoms.  She also additionally had some shortness of breath with the  episode.  No neck radiation and currently the patient is chest pain  free.   PAST MEDICAL HISTORY:  1. Coronary artery disease with most recently left heart cath in 2005      with nonobstructive coronary disease.  2. Diabetes.  3. Hypertension.  4. Hyperlipidemia.   SOCIAL HISTORY:  She lives in [place], [place] history of  smoking but quit in 2003.  No alcohol.  No drug use.   FAMILY HISTORY:  Reviewed and noncontributory to the patient's current  medical condition.   ALLERGIES:  No known drug allergies.   MEDICATIONS:  1. Atenolol 50 mg a day.  2. Aspirin 81 mg a day.  3. Glyburide  2.5 mg a day.  4. Crestor 20 mg nightly.  5. Actos 45 mg once a day.  6. Diovan/hydrochlorothiazide 320/25 mg p.o. daily.   REVIEW OF SYSTEMS:  Negative for review systems except for those  dictated in the above HPI.   PHYSICAL EXAMINATION:  VITAL SIGNS:  Blood pressure is 120/66, heart  rate is 67-70.  She is afebrile.  GENERAL:  Well-developed, well-nourished African American female in no  acute distress.  HEENT:  Moist mucous membranes.  No scleral icterus.  No conjunctival  pallor.  NECK:  Full range of motion.  No jugular venous distention.  CARDIOVASCULAR:  Regular rate and rhythm.  No rubs, murmurs, or gallops.  CHEST:  Clear to auscultation bilaterally.  No wheeze, rales, or  rhonchi.  ABDOMEN:  Soft, nontender, and nondistended.  Normoactive bowel sounds.  EXTREMITIES:  No peripheral edema.  Pulses are 2+ bilaterally.  NEURO:  Alert and oriented x 3.  Cranial nerves II through XII are  grossly intact.  No muscle strength deficits.   Chest x-ray no acute infiltrates.  EKG normal sinus rhythm with septal  Q's and no acute ST-T wave abnormalities and uncharged from prior ECG.  Laboratory data is significant for hemoglobin 11.7, BUN and creatinine  of 18 and 1.2, glucose of 174, first set of cardiac biomarkers within  normal limits.   IMPRESSION:  1. Probable acute coronary syndrome, unstable angina.  2. Diabetes.  3. Hypertension.  4. Hyperlipidemia.   PLAN:  Admit the patient to the telemetry monitoring with Dr. Percival Spanish.  Heparin drip, aspirin, beta-blocker, continue her statin.  At this time,  we will keep her n.p.o. for a likely noninvasive risk assessment in the  a.m. given her story with both typical and atypical features and  intermediate risk factor profile.      Karma Lew, MD  Electronically Signed     JT/MEDQ  D:  06/16/2008  T:  06/17/2008  Job:  [id number]

## 2011-04-21 NOTE — Op Note (Signed)
NAMEQUANNA, [name]               ACCOUNT NO.:  [id number]   MEDICAL RECORD NO.:  [id number]          PATIENT TYPE:  AMB   LOCATION:  DAY                          FACILITY:  [facility]   PHYSICIAN:  Susa Day, M.D.    DATE OF BIRTH:  September 23, 1951   DATE OF PROCEDURE:  08/02/2006  DATE OF DISCHARGE:                                 OPERATIVE REPORT   PREOPERATIVE DIAGNOSIS:  Spinal stenosis, lumbar 4, 5, bilaterally.   POSTOPERATIVE DIAGNOSIS:  Spinal stenosis, lumbar 4, 5, bilaterally.   OPERATION PERFORMED:  1. Bilateral hemilaminotomy; and,  2. Recess decompression foraminotomy of lumbar 5.   SURGEON:  Susa Day, M.D.   ASSISTANT:  Rometta Emery, P. A.   ANESTHESIA:  General.   BRIEF HISTORY AND INDICATIONS:  This is a 60 year old with bilateral lower  extremities L5 radiculopathy secondary to lateral recessed stenosis,  multifactorial at L4, 5.  The patient has positive __________  signs and EHL  weakness refractory to conservative treatment including rest, activity  modification and epidural steroid injections, etc.  Surgical intervention  was indicated for decompression of the 5 root bilaterally.   The risks and benefits were discussed  with the patient including bleeding,  infection, failure of surgery to alleviate the pain, CSF leak, fibrosis, and  need for further procedure in the future; and, complications, etc.   TECHNIQUE:  With the patient in the supine position and after the induction  of adequate general anesthesia and 1 gram of Ancef was given she was placed  prone on the [place] frame.  All bony prominences were well padded.  The  lumbar area was prepped and draped in the usual sterile fashion.  Two 18-  gauge spinal needles were utilized to localize the L4, 5 interspace, which  was confirmed on x-ray.   An incision was made from the spinous process of L4 to 5.  The subcutaneous  tissue was dissected, and electrocautery was utilized to achieve  hemostasis.  The dorsolumbar fascia was identified and divided in line with the skin  incision on either side of the interspinous ligament.  The paraspinous  muscle was elevated from of L4 and 5.  McCullough retractors were placed.   The operative microscope was draped and brought into the surgical field.  Attention was turned towards the right side, which was the most symptomatic  side.   A hemilaminotomy at the caudad edge of L4 was performed with the 2 and 3 mm  Kerrisons to detach the ligamentum flavum.  In a similar fashion it was  detached from the cephalad edge of L5.  Utilizing 2 and 3 mm Kerrisons we  performed an L5 foraminotomy.  We identified the 5 root and gently mobilized  it medially protecting with the D'Errico.  We complete a lateral recessed  decompression with a 2 mm Kerrison to the medial border of the pedicle.   Focal HNP was noted.  An annulotomy was therefore performed.  Copious  portion of disk material was  removed from the disk space.  All herniated  material was removed utilizing multiple passes with the  straight and  upbiting pituitary. No further disk herniation were noted.   __________  placed freely out of the foramen of 4 and 5.  We copiously  irrigated the disk space and hemilaminotomy with antibiotic irrigation.  Thrombin soaked Gelfoam was placed in the laminotomy defect.  Attention was  then turned towards the left.   In a similar fashion we decompressed the interspace at 4, 5.  On the left  there was no disk herniation here, however, so we did not perform a  diskectomy.  The 5 root was decompressed laterally as it on the right.  After foraminotomy was performed and the decompression the 5 root mobilized  easily with a centimeter medial to the pedicle as on the right.  __________  probe was placed in the foramen of 4 and 5, and found to be widely patent.  The wound was copiously irrigated with antibiotic irrigation.  Inspection  revealed no CSF  leak or active bleeding, or disk herniation on the left.  Thrombin soaked Gelfoam was placed the laminotomy defect.   The McCullough retractor was removed.  The paraspinous muscle was inspected  at completion.  The dorsolumbar fascia was reapproximated with #1 Vicryl  interrupted figure-of-eight sutures.  The subcutaneous tissues were  reapproximated with 2-0 Vicryl simple sutures.  The skin was reapproximated  with staples.  The wound  was dressed sterilely.   The patient was placed supine on her hospital bed, extubated without  difficulty and transported to the recovery room in satisfactory condition.   The patient will proceed with home medication.      Susa Day, M.D.  Electronically Signed     JB/MEDQ  D:  08/02/2006  T:  08/03/2006  Job:  [id number]

## 2011-04-21 NOTE — H&P (Signed)
[place]  Patient:    [name], [name] Visit Number: [id number] MRN: [id number]          Service Type: MED Location: [phone] Attending Physician:  Minus Breeding Dictated by:   Minus Breeding, M.D. [facility] Proc. Date: 03/06/02 Admit Date:  03/06/2002   CC:         Thomasene Mohair, P.A.-C.   History and Physical  REASON FOR PRESENTATION:  Chest pain.  HISTORY OF PRESENT ILLNESS:  The patient is a pleasant 60 year old African-American female, whose past cardiac history includes apparently a cardiac catheterization some years ago.  She reports it was done here, but we do not have records.  She reports that this was normal, although she was told she had "enlarged heart".  She had chest pain apparently last year, and was referred by Dr. Laurance Flatten to our office for stress perfusion study; which demonstrated an EF of 67% and no evidence of ischemia or infarction.  Over the past week she has had chest discomfort that has been different from previous pain.  She describes a substernal chest pressure that has been nonradiating.  She has had no neck or arm discomfort.  It waxes and wanes in intensity.  It occurs at rest and is not related to exertion, although she is very sedentary.  She has had some shortness of breath with this.  Last night she had discomfort, "which was the worst."  She was apparently by somebody at work, but did not want to go to the emergency room.  She presented to  Mr. Webster today and was referred here after nitroglycerin improved her symptoms.  She is currently having 4 out of 10 chest discomfort. She has had no associated nausea, vomiting or diaphoresis.  She has had no palpitations, presyncope or syncope.  She denies any shortness of breath, PND or orthopnea.  PAST MEDICAL HISTORY: 1. Diabetes mellitus. 2. Hypertension. 3. Hyperlipidemia. 4. Esophageal stricture. 5. Recent bronchitis. 6. Prolapsed bladder.  PAST  SURGICAL HISTORY:  Hysterectomy.  ALLERGIES:  PENICILLIN, CODEINE, GLUCOPHAGE.  MEDICATIONS: 1. Zestril 10 mg q.d. 2. Procardia 30 mg q.d. 3. Actos 30 mg q.d. 4. K-Dur 20 mEq b.i.d. 5. Glyburide 10 mg b.i.d. 6. Atenolol 50 mg q.d.  SOCIAL HISTORY:  The patient is single and widowed, lives in [place].  She works in [employer] in [employer].  She has two daughters.  She smoked one pack per day or less than this for about 30 years.  She drinks alcohol daily, or there about.  She will go some days without it.  It is always beer.  She may drink up to a 12-pack when she is off work or on the weekends.  FAMILY HISTORY:  Noncontributory for early coronary artery disease.  REVIEW OF SYSTEMS:  As stated in the HPI.  Positive for bilateral hand numbness, occasional blurred vision.  The patient is complaining of sore throat.  Otherwise negative for all other systems.  PHYSICAL EXAMINATION:  GENERAL:  The patient is in no distress.  VITAL SIGNS:  Blood pressure 144/88, heart rate 81 and regular, respirations 18, afebrile.  HEENT:  Eyes unremarkable.  Pupils equal, round and reactive to light.  Poor dentition.  Upper dentures.  Patchy white exudate on both tonsils.  NECK:  No jugular venous distention; wave form within normal limits.  Carotid upstroke brisk and symmetric, no bruits, thyromegaly.  LYMPHATICS:  No cervical, axillary or inguinal adenopathy.  LUNGS:  Clear to auscultation bilaterally.  BACK:  Left costovertebral angle tenderness.  CHEST:  Unremarkable.  HEART:  PMI not displaced; was sustained.  S1 and S2 within normal limits.  No S3.  No S4.  No murmurs.  ABDOMEN:  Flat, positive bowel sounds.  Normal frequency and pitch.  Mild tenderness to deep palpation in the mid epigastric region, no rebound.  SKIN:  No rash or nodules.  EXTREMITIES:  Two-plus pulses throughout.  No clubbing, cyanosis or edema.  NEUROLOGIC:  Oriented to person, place and time.   Cranial nerves II-XII grossly intact.  Motor grossly intact.  LABS:  Sodium 139, potassium 3.2, BUN 10, creatinine 0.8, glucose 135.  White blood cells 18.7, hemoglobin 14.6, platelets 259. CK 96, MB 1.8, Troponin 0.02.  CHEST X-RAY:  No acute disease.  EKG:  Sinus rhythm.  Axis within normal limits.  Short P-R interval.  QTC prolongation.  Poor anterior R-wave progression, suggestive of an old anteroseptal myocardial infarction.  No acute ST-T wave changes.  No change from previous EKGs.  ASSESSMENT AND PLAN: 1. CHEST DISCOMFORT.  The patients chest discomfort has some typical features for possible angina.  There were also some atypical features in that it has been constant and without any rise in her CK or Troponins.  It is different than previous pain, which has been evaluated with catheterizations and effusion studies.  She has significant cardiovascular risk factors.  Given this, I would proceed with cardiac catheterization to rule out unstable angina.  She will be managed with heparin, beta blockers and nitrates.  I will also check a lipase and amylase (although I doubt that this is the etiology of her discomfort).  Further evaluation was based on her laboratories and catheterization results.  2. PHARYNGITIS.  The patient did have patches of white exudate on her tonsils, and has been complaining of some sore throat.  She will be treated with erythromycin.  3. TOBACCO ABUSE.  The patient will be counseled to stop smoking, and will have a consult for this.  4. HYPERTENSION.  She will continue her outpatient medications, although I might try to pare this down to a couple of medications.  Probably would discontinue Procardia.  5. OBESITY.  We can discuss this in the long term.  6. RISK REDUCTION:  Will check a lipid profile and manage this as needed.  7. DIABETES MELLITUS.  The patient will continue with her oral medications and  will have sliding-scale insulin as needed  here. Dictated by:   Minus Breeding, M.D. [place] Attending Physician:  Minus Breeding DD:  03/06/02 TD:  03/08/02 Job: [id number]

## 2011-04-21 NOTE — Cardiovascular Report (Signed)
[name], [name]               ACCOUNT NO.:  [id number]   MEDICAL RECORD NO.:  [id number]          PATIENT TYPE:  OIB   LOCATION:  6501                         FACILITY:  [facility]   PHYSICIAN:  Glori Bickers, M.D. LHCDATE OF BIRTH:  1951/06/09   DATE OF PROCEDURE:  11/29/2004  DATE OF DISCHARGE:                              CARDIAC CATHETERIZATION   PRIMARY CARE PHYSICIAN:  Addison Naegeli, M.D.   CARDIOLOGIST:  Minus Breeding, M.D.   PATIENT IDENTIFICATION/INDICATION:  Ms. Feeley is a very pleasant 60-year-  old woman, with a history of CAD status post PTA and stenting of her  proximal ramus and a balloon angioplasty of an OM3 in 2003 by Dr. Christy Sartorius, who is referred for outpatient catheterization due to recurrent  stable angina.   PROCEDURES PERFORMED:  1.  Left heart catheterization.  2.  Left ventriculogram.  3.  Selective coronary angiography.   DESCRIPTION OF THE PROCEDURE:  Risks and benefits of the procedure were  explained to Ms. Cisse.  Consent was signed and placed on the chart.  The  right groin was prepped and draped in routine sterile fashion.  Subsequently, the area was then anesthetized with 1% local lidocaine.  A 4  French arterial sheath was placed in the right femoral artery using a  modified Seldinger technique.  Standard catheters including JR4, JL4 and a  bend pigtail were used for the catheterization.  All catheter exchanges were  made over a wire.  There are no apparent complications.   FINDINGS:  Aortic pressure is 130/69 with a mean of 95, LV was 137/17.  There was apparently a 10 mmHg gradient across the aortic valve on pullback;  however, there was no difficulty crossing the aortic valve.   CORONARY ANATOMY:  1.  Left main was normal.  2.  LAD:  Was a large vessel that wraps the apex, gives off a small D-1 and      a large D-2.  There is a 25% tubular lesion in the midsection.   Left circumflex is a large vessel that gives off a small  OM-1 and an OM-2  and a large branching OM-3, as well as a large ramus intermediate.  In the  circ itself there is a 30-40% tubular lesion at the prior PTCA site in the  OM-3.  Distal to that there is a total occlusion of a very tiny branch which  fills late from the septum.  This is less than a 1 mm branch.  In the ramus  intermedius the proximal stent has a 25% lesion, otherwise is widely patent.   The RCA is a dominant with a small PDA and a medium PL branch.  There is a  60% tubular lesion in the proximal section which is worse when compared to  her previous catheterization in 2003, however, does not appear to be flow  limiting.  There also is a very large acute marginal branch, with some minor  irregs, which also seems to give off a small PDA branch.   Left ventriculogram done in the RAO approach shows a visual estimate EF  of  greater than 60%.  There is no significant mitral regurgitation.   ASSESSMENT AND PLAN:  Both her ramus stent and her previous angioplasty site  and her OM-3 are patent with only some minor nonobstructive disease.  She  does, however, have total occlusion of a very small distal branch of her OM-  3 which will need to be treated medically.  Catheterization also shows  progression of her proximal RCA disease but is not flow limiting.  I have  reviewed the films with her cardiologist, Dr. Percival Spanish.  We will continue  with aggressive medical management including increasing her Imdur to 60 mg  daily.  He will see her back in followup.      Darden Dates   DB/MEDQ  D:  11/29/2004  T:  11/29/2004  Job:  [id number]   cc:   Wannetta Sender. Benjamine Mola, M.D.  [place]  [place] [id number]  Fax: [phone]   Minus Breeding, M.D.

## 2011-04-21 NOTE — Discharge Summary (Signed)
. [facility]  Patient:    [name], [name] Visit Number: [id number] MRN: [id number]          Service Type: CAT Location: [id number] Attending Physician:  Christy Sartorius Dictated by:   Mannie Stabile, P.A. Admit Date:  04/22/2002 Discharge Date: 04/23/2002   CC:         Thomasene Mohair, M.D., [place], [place]   Referring Physician Discharge [place]  PROCEDURES:  Coronary angiography/stent of ramus intermedius and percutaneous transluminal coronary angioplasty of third obtuse marginal on Apr 22, 2002.  HISTORY OF PRESENT ILLNESS:  [name] is a 60 year old female with recent coronary angiography revealing moderate coronary artery disease and preserved LV function who had subsequent outpatient Cardiolite demonstrating anterolateral ischemia.  She was thus referred for elective percutaneous intervention.  Please refer to the dictated admission note for full details.  LABORATORY DATA:  WBC 9.3, HGB 12.1, HCT 35.4, and platelets 243 at discharge. Sodium 138, potassium 3.3, glucose 213, BUN 9, and creatinine 0.9 at discharge (potassium 3.6 on admission).  HOSPITAL COURSE:  The patient was admitted for elective coronary angiography performed by Christy Sartorius, M.D. (see catheterization report for full details), revealing 70% ramus intermedius and 90% OM3.  She underwent subsequent successful stenting of a 70% RI lesion to 0% residual stenosis and reduction of the 90% OM3 lesion to 30% residual stenosis.  There were no noted complications.  Christy Sartorius, M.D., recommended continuation of Plavix x 6 months.  Postoperative course notable for absence of chest pain and stable right groin. However, the patient complained of some generalized malaise and dysuria.  She was afebrile and a urine specimen was referred for analysis.  The recommendation was to discharge home on prophylactic oral antibiotics for a possible urinary tract infection.  The patient also had mild  hypokalemia (3.3) and was repleted prior to discharge.  Of note, she is on maintenance supplemental potassium in the absence of any diuretic.  The recommendation is to have a follow-up metabolic profile in one week.  DISCHARGE DIAGNOSES: 1. Coronary artery disease.    a. Status post elective stent of 70% ramus intermedius/percutaneous       transluminal coronary angioplasty of 90% third obtuse marginal on       Apr 23, 2002.    b. Status post cardiac catheterization on March 10, 2002.  Medical therapy       recommended.  Abnormal adenosine Cardiolite on March 31, 2002, with       anterolateral ischemia.    c. Preserved left ventricular function. 2. Hypokalemia. 3. Dysuria. 4. Hypertension. 5. Dyslipidemia. 6. Type 2 diabetes mellitus. 7. Obesity. 8. Esophageal stricture.    a. Status post dilatation. Dictated by:   Mannie Stabile, P.A. Attending Physician:  Christy Sartorius DD:  04/23/02 TD:  04/23/02 Job: [id number]

## 2011-04-21 NOTE — Assessment & Plan Note (Signed)
[place] OFFICE NOTE   [name], [name]                        MRN:          [id number]  DATE:07/04/2006                            DOB:          1951-07-16   REFERRING PHYSICIAN:  Susa Day, MD   REASON FOR REFERRAL:  Preoperative evaluation in a patient with known  coronary disease.   HISTORY OF PRESENT ILLNESS:  The patient is a pleasant 60 year old African-  American female with coronary disease, as described below.  She is being  considered for back surgery.  She has been somewhat limited by left leg  pain, but she still does her activities of daily living and works.  With  this, she denies any chest discomfort, neck discomfort arm discomfort,  activity-induced nausea, vomiting, or excessive diaphoresis.  She has no  palpitations, presyncope, or syncope.  She said on the 19th of this month,  she awoke with acute shortness of breath and swelling.  Apparently, these  symptoms passed spontaneously after several minutes to hours.  She did not  seek medical attention at that time and has had no recurrence of this.  She  has otherwise not been describing PND or orthopnea.   PAST MEDICAL HISTORY:  Coronary artery disease (catheterization November 29, 2004, LAD large vessel wrapping the apex, small diagonal, first and second,  25% tubular mid stenosis.  Left circumflex artery gave off a small first and  second obtuse marginal and a large branching third obtuse marginal.  The  circumflex at 30-40% stenosis.  There was a previous angioplasty.  Third  obtuse marginal, distal vessel had total occlusion.  The ramus intermedius  had 25% stenosis.  The right coronary artery is dominant with 60% stenosis.  The EF was 60%).  Esophageal stricture, status post dilatation.  Non-insulin-  dependent diabetes mellitus.  Hyperlipidemia.  Hypertension.  Bronchitis,  bladder tack, hysterectomy, right shoulder  surgery.   ALLERGIES:  PENICILLIN, CODEINE.  Patient also has rash and itching on  PLAVIX.   MEDICATIONS:  1.  Atenolol 50 mg b.i.d.  2.  Hydrochlorothiazide 25 mg daily.  3.  Aspirin 81 mg daily.  4.  Glyburide/Metformin 2.5/500 2 b.i.d.  5.  Benicar 40 mg a day.  6.  Lipitor 40 mg daily.   SOCIAL HISTORY:  Patient lives in [place].  She works at [employer].  She is a Engineer, manufacturing systems.  She has two daughters.  She smoked for 30 years  but quit in 2003.  She drinks beer occasionally.   FAMILY HISTORY:  Noncontributory for early coronary artery disease in first  degree relatives.   REVIEW OF SYSTEMS:  As stated in the HPI and otherwise negative for other  systems.   PHYSICAL EXAMINATION:  VITAL SIGNS:  Blood pressure 168/92, heart rate 63  and regular.  Weight 187 pounds.  Body Mass Index 30.  GENERAL:  Patient is in no distress.  HEENT:  Eyes unremarkable.  Pupils are equal, round and reactive to light  and accommodation.  Fundi not visualized.  Oral mucosa unremarkable.  NECK:  No jugular venous distention.  Waveform within normal limits.  Carotid upstrokes brisk and symmetric.  No bruits.  No thyromegaly.  LYMPHATICS:  No cervical, axillary, inguinal adenopathy.  LUNGS:  Clear to auscultation bilaterally.  BACK:  No costovertebral angle tenderness.  CHEST:  Unremarkable.  HEART:  PMI not displaced or sustained.  S1 and S2 within normal limits.  No  S3, S4, or murmurs.  ABDOMEN:  Obese.  Positive bowel sounds.  Normal in frequency, pitch,  bruits.  No guarding, rebound, or midline pulsatile masses.  No  organomegaly.  SKIN:  No rashes.  Positive keloids.  Black, misshaped mole on the medial  aspect of the left foot.  EXTREMITIES:  Pulses 2+ throughout.  No clubbing, cyanosis or edema.  NEUROLOGIC:  Oriented to person, place, and time.  Cranial nerves II-XII are  grossly intact.  Motor grossly intact.   EKG:  Sinus rhythm.  Rate 64.  Axis within normal limits.  Short  P-R  interval.  Poor anterior R wave progression, suggestive of an old  anteroseptal myocardial infarction.  No acute ST-T wave changes.  No change  from previous EKGs.   ASSESSMENT/PLAN:  1.  Preoperative evaluation:  The patient has coronary disease, as      described.  She had a slightly abnormal Cardiolite in 2005.  She has had      a low to moderate functional level for her age.  She is going for a      higher risk surgery from a cardiovascular standpoint.  She did have some      symptoms of acute dyspnea several days ago.  Given all of this and      according to ACC/HA guidelines, the patient needs screening with a      stress perfusion study.  High risk studies would necessitate further      evaluation.  Otherwise, she will continue medical management, to include      her beta blocker, keeping her heart rate in the 60s.  2.  Hypertension:  Blood pressure is not well controlled.  She will start      back on Procardia XL 60 mg daily in addition to her medicines, as above.      She will have this checked when she is in the hospital and also followed      by Dr. Benjamine Mola and myself.  3.  Obesity:  She understands the need to lose weight with diet and exercise      when she recovers from this surgery.  4.  Dyslipidemia:  Per Dr. Benjamine Mola.  5.  Followup:  I will see her back based on the results of the above study.      She is scheduled      for followup in October.  I would be happy to follow her in the      hospital.  I would suggest that in the hospital she will be monitored on      telemetry postoperatively.                               Minus Breeding, MD, [facility]  JH/MedQ  DD:  07/04/2006  DT:  07/04/2006  Job #:  [id number]   cc:   Susa Day, MD  Wannetta Sender. [place], MD

## 2011-04-21 NOTE — Consult Note (Signed)
[place]. [facility]  Patient:    [name], [name] Visit Number: [id number] MRN: [id number]          Service Type: MED Location: [phone] Attending Physician:  Minus Breeding Dictated by:   Knox Saliva, M.D. Proc. Date: 03/10/02 Admit Date:  03/06/2002   CC:         Jeneen Rinks L. Oletta Lamas, M.D.  Minus Breeding, M.D. [facility]  Dr. Lyndel Safe   Consultation Report  HISTORY OF PRESENT ILLNESS:  Ms. Delaroca is a 60 year old female whom I am asked to see by Dr. Lyndel Safe for symptoms of dysphagia and chest pain.  She was admitted to the hospital on April 3 with substernal chest pressure and underwent cardiac evaluation.  On catheterization she had medically manageable coronary disease with a 30% mid LAD lesion, an 80% distal circumflex lesion, and a 50% right coronary lesion.  Her ejection fraction was over 55%.  It is apparently uncertain whether her chest discomfort was indeed due to angina. She also was complaining of a sore throat, difficulty swallowing, hoarseness, and occasional choking episodes.  She has a history of a mid esophageal stricture seen on barium swallow in 1999.  Upper endoscopy was attempted. There was a suggestion of candidiasis.  The patient apparently had severe wretching at the time of upper endoscopy and spontaneously suffered an upper esophageal mucosal tear.  This was superficial and Gastrografin swallow did not demonstrate any extravasation.  Nevertheless, she was admitted to the hospital for observation and discharged without any dilation being done.  She tells me that her reflux symptoms seem less than they were in 1999 but her difficulty swallowing has gotten worse.  However, this is somewhat inconstant. Solid foods and pills sometimes stick but she also has difficulty swallowing water at times.  She feels like the problem is high in the esophagus.  In hospital she has been fecal occult blood negative despite being on heparin. She  denies weight loss, hematochezia, or melena.  She does not have abdominal pain.  It is noted that her laboratory tests demonstrated borderline elevated transaminases on one occasion with a borderline amylase as well.  Lipase was normal.  PAST MEDICAL HISTORY:  Pertinent for diabetes mellitus type 2, obesity, hypertension, and hyperlipidemia.  PAST SURGICAL HISTORY:  Hysterectomy and bladder resuspension.  CURRENT MEDICATIONS:  1. Ecotrin.  2. Lisinopril 5 mg q.d.  3. Procardia XL 30 mg q.d.  4. Actos 30 mg q.d.  5. KCL 40 mEq q.d.  6. Atenolol 50 mg b.i.d.  7. Glyburide 10 mg b.i.d.  8. Zocor 20 mg q.d.  9. Zithromax 250 mg q.d. for pharyngitis. 10. Protonix 40 mg q.d. 11. Heparin has been discontinued.  ALLERGIES:  PENICILLIN, CODEINE, GLUCOPHAGE.  FAMILY HISTORY:  Pertinent for colonic polyps in her mother.  SOCIAL HISTORY:  She is widowed.  Works at [employer].  Has two daughters. Smokes up to a pack of cigarettes per day and daily drinks beer up to a six-pack a day when she is not working.  REVIEW OF SYSTEMS:  GENERAL:  No weight loss or night sweats.  ENDOCRINE: Positive for diabetes type 2.  SKIN:  No rash or pruritus.  She does form keloids.  HEENT:  Eyes:  No icterus or change in vision.  ENT:  No aphthous ulcers.  She does have a chronic sore throat and hoarseness.  RESPIRATORY:  No shortness of breath, cough, or wheezing.  CARDIAC:  As above. GASTROINTESTINAL:  As above.  GENITOURINARY:  No dysuria or hematuria. Remainder of review of systems is negative.  PHYSICAL EXAMINATION  GENERAL:  She is a moderately overweight adult female in no acute distress.  VITAL SIGNS:  Afebrile.  Blood pressure 160/90, pulse 76 and regular.  SKIN:  Keloids at an umbilical laparoscopy site for tubal ligation.  HEENT:  Eyes are anicteric.  Conjunctivae are pink.  Oropharynx reveals a few white spots on her tonsils.  CHEST:  Clear.  HEART:  Regular rate and  rhythm.  ABDOMEN:  Soft and mildly obese without mass, tenderness, or organomegaly. There is no rebound or hernia.  Bowel sounds are normal.  RECTAL:  Not performed.  EXTREMITIES:  Without clubbing, cyanosis, edema, or rash.  LABORATORIES:  Hemoglobin A1C 9.1, hemoglobin 13.3, white blood count 13.4, platelet count normal.  Blood sugar 165, creatinine 0.8.  A CT scan done for evaluation of pulmonary embolism was negative.  IMPRESSION:  A 60 year old diabetic, hypertensive female who smokes and drinks alcohol to excess who has symptoms suggestive of ongoing reflux with possible stricture formation and reflux laryngitis.  She never had completion of endoscopy and potential dilation in 1999 and her throat definitely needs to be reevaluated for this reason.  In addition, with the white spots on her tonsils and her history of diabetes, we need to rule out candidiasis.  Her chest discomfort with borderline liver and pancreatic enzymes could indicate passage of a gallstone.  Her family history of polyps means that she is a candidate as an outpatient for colonoscopy.  PLAN:  Upper endoscopy is suggested to the patient and reviewed in terms of technique, preparation, and risks of complications including bleeding and perforation.  Despite her prior experience she is willing to proceed.  It will be scheduled in the morning.  Abdominal ultrasound is ordered to rule out gallstones. Dictated by:   Knox Saliva, M.D. Attending Physician:  Minus Breeding DD:  03/10/02 TD:  03/10/02 Job: [id number]

## 2011-04-21 NOTE — Cardiovascular Report (Signed)
[place]. [facility]  Patient:    ZYMIA, YACKO Visit Number: [id number] MRN: [id number]          Service Type: CAT Location: [place] 01 Attending Physician:  Christy Sartorius Dictated by:   Christy Sartorius, M.D. Proc. Date: 04/22/02 Admit Date:  04/22/2002 Discharge Date: 04/23/2002   CC:         Clois Dupes, M.D.  Minus Breeding, M.D. [facility]   Cardiac Catheterization  PROCEDURE: 1. Selective angiography. 2. Percutaneous transluminal coronary angioplasty and stent placement    to ramus intermedius branch. 3. Percutaneous transluminal coronary angioplasty of the third marginal    branch left circumflex artery.  DIAGNOSES: 1. Coronary artery disease. 2. Unstable angina.  CARDIOLOGIST:  Christy Sartorius, M.D.  HISTORY:  Ms. Calliham is a 60 year old black female with a known history of coronary artery disease who had previously undergone coronary angiogram on March 10, 2002.  She had substernal chest discomfort and at that time was found to have severe narrowing of 70% in a small ramus intermedius branch as well as 90% in the distal segment of a small third marginal branch.  Medical therapy was recommended; however, the patient has continued to have discomfort. Stress imaging studies showed ischemia in the anterolateral wall.  She presents now for percutaneous intervention to the intermediate branch and possibly the marginal of circumflex.  TECHNIQUE:  After informed consent was obtained, the patient was brought to the catheterization lab.  A 6-French sheath was placed in the left femoral artery.  A 6-French 3.5 guide catheter was used to engage the left coronary artery and selective guide shots obtained which confirmed the presence of a high-grade narrowing of 70% in the proximal segment of the ramus intermedius branch extending up to the ostium.  The small third marginal branch also had a 95% narrowing in its mid section.  The patient was given  heparin and Integrilin on a weight-adjusted basis and 300 mg Plavix orally.  A 0.014 inch Forte wire was advanced into the distal segment of the intermediate branch, and a 2.5 x 10 mm cutting balloon was introduced.  A total of four inflations were performed at up to 8 atmospheres for 40 seconds in the proximal segment.  Repeat angiography showed significant improvement in vessel lumen.  There was mild haziness in the proximal segment of the vessel.  It was unclear whether this was streaming around the wire or vessel disruption.  A 2.5 x 18 mm Cypher stent was then introduced and carefully position in the proximal segment of the branch extending up to the ostium and deployed at 11 atmospheres for 60 seconds.  A 2.5 x 15 mm Quantum Maverick balloon was introduced and two inflations performed within the distal and proximal segments of the stent at 14 atmospheres for 30 seconds.  Repeat angiography after administration of intracoronary nitroglycerin showed an excellent result with no residual stenosis, full coverage of the lesion, and TIMI-3 flow through the intermediate branch.  The Forte wire was then advanced into the distal section of the third marginal branch and the 2.5 x 15 mm Quantum Maverick balloon introduced.  Due to excessive tortuosity of the vessel, it was not felt that the cutting balloon could be negotiated into this segment of the vessel.  The Quantum Maverick balloon was then inflated twice at 12 atmospheres for 40 seconds and at 18 atmospheres for 50 seconds when the first inflation showed residual waste in the mid section of the balloon.  Repeat angiography then showed significant improvement in vessel lumen.  There was mild haziness in the mid section of the stenosis; however, there was TIMI-3 flow distally, and it did not appear to be a dissected vessel.  The lesion was extremely tight, fibrotic, and difficult to open.  As we achieved a nice result, we felt best to  leave this alone.  Final angiography was performed in various projections showing no distal vessel damage and less than 30% residual narrowing.  The guide catheter was then removed and the sheath secured in position.  The patient tolerated the procedure well and was transferred to the floor in stable condition.  FINAL RESULT: 1. Successful PTCA and stent placement in the proximal segment of the    ramus intermedius branch with reduction in 70% narrowing to 0% with    placement of 2.5 x 18 mm Cypher stent. 2. Successful PTCA of the mid section of the third marginal branch with    reduction of 90% narrowing to less than 30% using a 2.5 mm Quantum    Maverick balloon. Dictated by:   Christy Sartorius, M.D. Attending Physician:  Christy Sartorius DD:  04/22/02 TD:  04/23/02 Job: [id number]

## 2011-04-21 NOTE — H&P (Signed)
. [facility]  Patient:    [name], [name] Visit Number: [id number] MRN: [id number]          Service Type: CAT Location: [place] 01 Attending Physician:  Christy Sartorius Dictated by:   Davis Gourd, P.A. Admit Date:  04/22/2002                           History and Physical  DATE OF BIRTH:  21-Feb-1951  CHIEF COMPLAINT:  Chest pain with an abnormal Cardiolite.  HISTORY OF PRESENT ILLNESS:  [name] is a 60 year old female with a history of coronary artery disease who had a cardiac catheterization on March 10, 2002 and it showed a 30% LAD, 80% lesion in the obtuse marginal with a 60% ramus; she also had a 50% mid-RCA stenosis and her EF was greater than 55%.  Dr. Christy Sartorius catheterized the patient at that time and recommended medical management.  She also had some dysphagia and throat pain, so a GI consult was recommended as well.  Since that time, she has had episodes of chest pressure that are atypical but when she was seen in the office for this, a Cardiolite was ordered.  She had an adenosine stress test on March 31, 2002 that showed ischemia in the anterolateral walls.  There was possible coexistent soft tissue attenuation or shifting.  Her EF at that time was 71% with normal wall motion; because of this, the patient returns today for catheterization.  PAST MEDICAL HISTORY: 1. Cardiac catheterization, March 10, 2002, showing normal left main, LAD 30%,    ramus intermediate 60%, OM-2 80% and RCA 50%. 2. Preserved left ventricular function with an EF of greater than 55% by    cath and 71% by Cardiolite in 2003. 3. Status post chest CT to rule out PE, which was negative. 4. Esophageal strictures, status post dilatation. 5. Non-insulin-dependent diabetes mellitus. 6. Hypertension. 7. Hyperlipidemia. 8. Recent history of bronchitis. 9. History of prolapsed bladder.  SURGICAL HISTORY: 1. Cardiac catheterization. 2. Bladder  tack. 3. Partial hysterectomy. 4. Right shoulder surgery.  FAMILY HISTORY:  She has one uncle that died of heart disease before the age of 43 but both parents are still living and there is no heart disease in any of her siblings.  SOCIAL HISTORY:  She is widowed and lives in [place].  She works at [employer] as a Engineer, manufacturing systems.  She has two daughters.  She has not smoked since April of 2003 after her last cath; she probably has about a 25-pack-year history.  She is averaging six beers a week right now.  ALLERGIES:  PENICILLIN and CODEINE.  MEDICATIONS:  1. Zestril 5 mg p.o. q.d.  2. K-Dur 20 mg p.o. b.i.d.  3. Atenolol 50 mg b.i.d.  4. Procardia 30 mg q.d.  5. Zocor 20 mg p.o. q.d.  6. Protonix 40 mg p.o. q.d.  7. Imdur 30 mg p.o. q.d.  8. Glyburide 20 mg b.i.d.  9. Actos 30 mg p.o. q.d. 10. Nitroglycerin sublingual p.r.n.  REVIEW OF SYSTEMS:  Positive for pain in the left rear part of her neck that is possibly musculoskeletal for which she has not been evaluated.  She has chronic constipation.  She has problems with heartburn or indigestion frequently after meals, even on Protonix.  She has chronic dyspnea on exertion.  Review of systems is otherwise negative.  PHYSICAL EXAMINATION:  VITAL SIGNS:  Her temperature is 97  with a blood pressure of 130/75, pulse 68 and respirations 16.  GENERAL:  She is a well-developed, slightly obese African American female in no acute distress.  HEENT:  Head is normocephalic and atraumatic.  Pupils are equal, round and reactive to light and accommodation.  Extraocular movements are intact.  NECK:  There is no JVD.  There are no carotid bruits appreciated.  She has no palpable masses or deformities on the back of her neck.  CHEST:  Slightly decreased breath sounds in the bases with no crackles.  CV:  Heart is regular in rate and rhythm with no significant murmur, rub or gallop appreciated.  ABDOMEN:  Soft and nontender  without bruit noted and with no hepatosplenomegaly noted.  EXTREMITIES:  Previous cath site is well-healed.  There is no lower extremity edema appreciated.  Distal pulses are intact in all four extremities.  NEUROLOGIC:  Alert and oriented without focal deficits noted and cranial nerves II-XII are grossly intact.  SKIN:  No significant lesions noted.  LABORATORY AND ACCESSORY DATA:  ECG:  Sinus rhythm with an old septal infarct and no ischemic changes.  Laboratory values are pending at the time of dictation.  ASSESSMENT AND PLAN:  1. Chest pain, with an abnormal Cardiolite:  The patient is to be     catheterized today and consideration given to percutaneous intervention to     the second obtuse marginal.  2. Diabetes mellitus:  The patients Glyburide will be held today, although     it is acceptable for her to take her Actos, and we will check capillary     blood glucoses before every meal and at bedtime.  3. Gastroesophageal reflux disease symptoms:  The patient is on Protonix     every day but her symptoms are poorly controlled.  Medical doctor is to     advise about considering Reglan before every meal and at bedtime and     Protonix will be increased to twice a day.  4. The patient is otherwise stable and will be continued on her other home     medications. Dictated by:   Davis Gourd, P.A. Attending Physician:  Christy Sartorius DD:  04/22/02 TD:  04/22/02 Job: [id number]

## 2011-04-21 NOTE — Letter (Signed)
July 12, 2006     Susa Day, MD  [place], [place] [address]   RE:  JERMECIA, DONATHAN  MRN:  [id number]  /  DOB:  1951-01-13   Dear Dr. Tonita Cong:   This letter is in regards to [facility] whom I have followed with her  history of coronary artery disease.  She is to undergo back surgery.  She  had some recent acute shortness of breath, and she has a relatively limited  functional level because of her orthopedic problems.  Because of this I sent  her for a stress perfusion study.  This demonstrated no evidence of ischemia  or infarct, and she had a normal ejection fraction.  With this result, she  is now at an acceptable risk for her planned procedure.  Of note, she was  somewhat hypertensive during the last visit, and I restarted Procardia which  she had been on in the past.  She should remain on her beta blocker as well  for the cardiovascular benefits with reduced incidents of myocardial  ischemia or infarct during a noncardiac surgery.  No further cardiovascular  testing is suggested.  If you would like me to see her at any point during  her hospitalization, please do not hesitate to give me a call at my beeper  [phone] or my cell phone, [phone].  Please note our referring physician  line that should be entered promptly if ever you need it is [phone].    Sincerely,    Minus Breeding, MD, [facility]   JH/MedQ  DD:  07/12/2006  DT:  07/12/2006  Job #:  [id number]

## 2011-04-21 NOTE — Cardiovascular Report (Signed)
[place]. [facility]  Patient:    [name], [name] Visit Number: [id number] MRN: [id number]          Service Type: MED Location: [phone] Attending Physician:  Minus Breeding Dictated by:   Christy Sartorius, M.D. Proc. Date: 03/10/02 Admit Date:  03/06/2002   CC:         Minus Breeding, M.D. [facility]  Clois Dupes, M.D.   Cardiac Catheterization  PROCEDURES PERFORMED: 1. Left heart catheterization. 2. Left ventriculogram. 3. Selective coronary angiography.  DIAGNOSES: 1. Moderate coronary artery disease. 2. Normal left ventricular systolic function.  HISTORY: The patient is a 60 year old black female, who presents with substernal chest pressure. The patient was admitted to the hospital and ruled out for acute myocardial infarction, and she presents for further assessment.  TECHNIQUE: After informed consent was obtained, the patient was brought to the cardiac catheterization lab where 6 French sheath was placed in the right femoral artery. Left heart catheterization, left ventriculogram and selective coronary angiography were then performed using preformed 6 French Judkins catheters. At the termination of the case, the catheters and sheath were removed and manual pressure applied until adequate hemostasis was achieved. The patient tolerated the procedure well and was transferred to the floor in stable condition.  FINDINGS: Findings are as follows: 1. Left main trunk: Mild irregularities. 2. LAD: This is a large caliber vessel that provides a diagonal branch in    the mid section. The LAD has mild diffuse disease in the mid section. 3. Left circumflex artery: This is a large caliber vessel that provides    several small marginal branches and a large marginal branch in the distal    segment. The AV circumflex has mild irregularities of 20%. The large    distal marginal branch has a distal narrowing of 80% in an area of    moderate tortuosity.  The vessel appears to be less than 2.5 mm at this    segment. 4. Ramus intermedius: This is a small caliber vessel with a tubular narrowing    of 60% in the proximal segment. 5. Right coronary artery: The right coronary artery is dominant. This is a    large caliber vessel that provides the posterior descending artery and    posterior ventricular branch in the terminal segment. There is diffuse    disease in the mid section with focal narrowing of 40-50% near the takeoff    of the RV marginal branch.  LEFT VENTRICULOGRAM: Normal end-systolic and end-diastolic dimensions. Overall left ventricular function is well preserved, ejection fraction of greater than 55%, no mitral regurgitation. LV pressure is 150/5, aortic is 150/80. LVEDP equals 15.  ASSESSMENT AND PLAN: The patient is a 60 year old female with moderate coronary artery disease. She has significant narrowing in the distal segment of a marginal branch. However, the vessel is small at this location and has moderate tortuosity. Continued medical therapy will be recommended at this point. Dictated by:   Christy Sartorius, M.D. Attending Physician:  Minus Breeding DD:  03/10/02 TD:  03/10/02 Job: [id number]

## 2011-04-21 NOTE — Discharge Summary (Signed)
[place]. [facility]  Patient:    [name], [name] Visit Number: [id number] MRN: [id number]          Service Type: MED Location: [phone] Attending Physician:  Minus Breeding Dictated by:   Arn Medal, P.A. Admit Date:  03/06/2002 Disc. Date: 03/11/02   CC:         Clois Dupes, M.D.  Juanda Bond. Harle Battiest, M.D.   Discharge Summary  DISCHARGE DIAGNOSES: 1. Chest pain, status post cardiac catheterization. 2. Pharyngitis. 3. Hypertension. 4. Non-insulin-dependent diabetes mellitus. 5. Hyperlipidemia. 6. Tobacco use.  HOSPITAL COURSE:  [name] is a 60 year old African American female who presented to the emergency room with a one-week history of central substernal chest pain and pressure.  She was seen and admitted by Dr. Minus Breeding. Dr. Percival Spanish felt the patients chest pain had some typical features; however, in light of her significant risk factors, he planned to admit her to the hospital to rule out myocardial infarction and for cardiac catheterization. He felt she also had pharyngitis and was started on a run of Zithromax.  Over the next couple of days, the patient did well and had some intermittent chest soreness; however, overall this lessened.  On April 5, she underwent a spiral CT of the chest which showed no signs of pulmonary embolus.  On Monday, April 7, the patient was taken to the catheterization lab by Dr. Christy Sartorius.  Catheterization results:  1. Left main coronary artery:  Mild disease. 2. Left anterior descending:  A 30% lesion in the mid vessel. 3. Left circumflex:  A large obtuse marginal with distal 80% lesion. 4. Ramus intermedius:  Proximal 60% lesion. 5. Right coronary artery:  ______ mid vessel lesion. 6. Left ventricle:  Ejection fraction greater than 55% with no mitral    regurgitation.  Dr. Lyndel Safe felt the patient had moderate coronary artery disease and recommended medical management.  He also  planned to request a GI consult to evaluate her dysphagia, as well as throat pain.  Later that day, the patient was seen by Dr. Knox Saliva.  He planned to have the patient undergo endoscopy with possible dilatation the following day.  The next day, the patient was taken to endoscopy with Dr. Vladimir Faster.  EGD revealed no significant esophagitis or stricture.  There was also a questionable nodule below the right vocal cord.  He recommended an ENT consult and continue Protonix treatment.  The patient was then seen by Dr. Percival Spanish.  He felt she was stable for discharge.  DISCHARGE MEDICATIONS: 1. Zestril 5 mg (one-half 10-mg tablet) q.d. 2. Procardia XL 30 mg q.d. 3. Actos 30 mg q.d. 4. K-Dur 20 mEq as previously taken. 5. Glyburide 10 mg (two 5-mg tablets) b.i.d. 6. Atenolol 50 mg b.i.d. 7. Protonix 40 mg q.d. 8. Zocor 20 mg q.h.s.  LABORATORY VALUES:  White count 13.4, hemoglobin 13.3, hematocrit 39.3, platelets 236.  Sodium 140, potassium 4.0, chloride 101, CO2 31, BUN 10, creatinine 0.8, glucose 165, alkaline phosphatase 85, SGOT 40, SGPT 42, total bilirubin 0.7, direct bilirubin 0.3, indirect bilirubin 0.4.  Hemoglobin A1c of 9.1.  Cholesterol 168, triglycerides 247, HDL 48, LDL 71, total cholesterol to HDL ratio 3.5.  Electrocardiogram shows sinus rhythm at 78.  PR interval 136, QRS 84, QTC 47, ______ -20.  DISCHARGE INSTRUCTIONS: 1. The patient is to avoid driving, heavy lifting, or tub baths for two days. 2. She is to follow a low-fat and low-salt diet. 3. She is to  watch the catheterization site for any pain, bleeding, or    swelling and to call the [facility] with any of these problems. 4. She is to continue to efforts to stop smoking. 5. She is to follow up with a P.A. visit and a groin check on April 21 at    noon. 6. She is to follow up with Dr. Harle Battiest April 21 at 4 p.m. 7. She is to follow up with Dr. Laurance Flatten as needed or scheduled. Dictated by:   Arn Medal, P.A. Attending Physician:  Minus Breeding DD:  03/11/02 TD:  03/11/02 Job: [id number]

## 2011-04-21 NOTE — Op Note (Signed)
   [name], [name]                         ACCOUNT NO.:  [id number]   MEDICAL RECORD NO.:  [id number]                   PATIENT TYPE:  AMB   LOCATION:  ENDO                                 FACILITY:  [facility]   PHYSICIAN:  John C. Amedeo Plenty, M.D.                 DATE OF BIRTH:  02/03/1951   DATE OF PROCEDURE:  06/02/2003  DATE OF DISCHARGE:                                 OPERATIVE REPORT   PROCEDURE PERFORMED:  Colonoscopy with biopsy.   ENDOSCOPIST:  Missy Sabins, M.D.   INDICATIONS FOR PROCEDURE:  Family history of colon polyps in a first degree  relative.   DESCRIPTION OF PROCEDURE:  The patient was placed in the left lateral  decubitus position and placed on the pulse monitor with continuous low-flow  oxygen delivered by nasal cannula.  The patient was sedated with 100 mcg of  IV fentanyl and 8 mg of IV Versed.  The Olympus video colonoscope was  inserted into the rectum and advanced to the vicinity of the cecum,  confirmed by transillumination of McBurney's point and visualization of the  ileocecal valve.  Despite multiple torquing maneuvers, abdominal pressure  and position changes, however, I could not advance the scope beyond the  ileocecal valve.  I was able to get a clear view of about two thirds of the  cecum but could not see directly behind the ileocecal valve and could not  rule out lesions in this area.  Otherwise, the cecum, ascending and  transverse colon all appeared normal with no masses, polyps, diverticula or  other mucosal abnormalities.  Within the descending and sigmoid colon there  were seen a few scattered diverticula and no other abnormalities.  The  rectum appeared normal on the retroflex view.  The anus revealed no obvious  internal hemorrhoids.  The scope was then withdrawn and the patient returned  to the recovery room in stable condition.  She tolerated the procedure well.  There were no immediate complications.   IMPRESSION:  Diverticulosis.   PLAN:  Repeat study in five years based on her family history.                                               John C. Amedeo Plenty, M.D.    [facility]/MEDQ  D:  06/02/2003  T:  06/02/2003  Job:  [id number]   cc:   Chipper Herb, M.D.  [address] [place]  [place] [address]  Fax: [phone]

## 2011-04-25 ENCOUNTER — Ambulatory Visit: Payer: BC Managed Care – PPO | Attending: Neurosurgery | Admitting: Physical Therapy

## 2011-04-25 DIAGNOSIS — M542 Cervicalgia: Secondary | ICD-10-CM | POA: Insufficient documentation

## 2011-04-25 DIAGNOSIS — R293 Abnormal posture: Secondary | ICD-10-CM | POA: Insufficient documentation

## 2011-04-25 DIAGNOSIS — IMO0001 Reserved for inherently not codable concepts without codable children: Secondary | ICD-10-CM | POA: Insufficient documentation

## 2011-04-28 ENCOUNTER — Ambulatory Visit: Payer: BC Managed Care – PPO | Admitting: Physical Therapy

## 2011-05-02 ENCOUNTER — Ambulatory Visit: Payer: BC Managed Care – PPO | Admitting: Physical Therapy

## 2011-05-04 ENCOUNTER — Ambulatory Visit: Payer: BC Managed Care – PPO | Admitting: Physical Therapy

## 2011-05-09 ENCOUNTER — Ambulatory Visit: Payer: BC Managed Care – PPO | Attending: Neurosurgery | Admitting: Physical Therapy

## 2011-05-09 DIAGNOSIS — M542 Cervicalgia: Secondary | ICD-10-CM | POA: Insufficient documentation

## 2011-05-09 DIAGNOSIS — IMO0001 Reserved for inherently not codable concepts without codable children: Secondary | ICD-10-CM | POA: Insufficient documentation

## 2011-05-09 DIAGNOSIS — R293 Abnormal posture: Secondary | ICD-10-CM | POA: Insufficient documentation

## 2011-05-11 ENCOUNTER — Ambulatory Visit: Payer: BC Managed Care – PPO | Admitting: Physical Therapy

## 2011-05-15 ENCOUNTER — Ambulatory Visit: Payer: BC Managed Care – PPO

## 2011-05-17 ENCOUNTER — Ambulatory Visit: Payer: BC Managed Care – PPO | Admitting: Physical Therapy

## 2011-05-23 ENCOUNTER — Ambulatory Visit: Payer: BC Managed Care – PPO | Admitting: *Deleted

## 2011-05-25 ENCOUNTER — Ambulatory Visit: Payer: BC Managed Care – PPO | Admitting: *Deleted

## 2011-05-25 ENCOUNTER — Other Ambulatory Visit (HOSPITAL_COMMUNITY): Payer: Self-pay | Admitting: Neurosurgery

## 2011-05-25 DIAGNOSIS — M5410 Radiculopathy, site unspecified: Secondary | ICD-10-CM

## 2011-05-25 DIAGNOSIS — M549 Dorsalgia, unspecified: Secondary | ICD-10-CM

## 2011-05-29 ENCOUNTER — Ambulatory Visit (HOSPITAL_COMMUNITY)
Admission: RE | Admit: 2011-05-29 | Discharge: 2011-05-29 | Disposition: A | Discharge status: Home / Self Care | Payer: BC Managed Care – PPO | Source: Ambulatory Visit | Attending: Neurosurgery | Admitting: Neurosurgery

## 2011-05-29 DIAGNOSIS — M545 Low back pain, unspecified: Secondary | ICD-10-CM | POA: Insufficient documentation

## 2011-05-29 DIAGNOSIS — M5410 Radiculopathy, site unspecified: Secondary | ICD-10-CM

## 2011-05-29 DIAGNOSIS — M51379 Other intervertebral disc degeneration, lumbosacral region without mention of lumbar back pain or lower extremity pain: Secondary | ICD-10-CM | POA: Insufficient documentation

## 2011-05-29 DIAGNOSIS — M5126 Other intervertebral disc displacement, lumbar region: Secondary | ICD-10-CM | POA: Insufficient documentation

## 2011-05-29 DIAGNOSIS — M5137 Other intervertebral disc degeneration, lumbosacral region: Secondary | ICD-10-CM | POA: Insufficient documentation

## 2011-05-29 DIAGNOSIS — M549 Dorsalgia, unspecified: Secondary | ICD-10-CM

## 2011-05-30 ENCOUNTER — Ambulatory Visit: Payer: BC Managed Care – PPO | Admitting: *Deleted

## 2011-06-01 ENCOUNTER — Ambulatory Visit: Payer: BC Managed Care – PPO | Admitting: *Deleted

## 2011-06-06 ENCOUNTER — Ambulatory Visit: Payer: BC Managed Care – PPO | Attending: Neurosurgery | Admitting: Physical Therapy

## 2011-06-06 DIAGNOSIS — R293 Abnormal posture: Secondary | ICD-10-CM | POA: Insufficient documentation

## 2011-06-06 DIAGNOSIS — M542 Cervicalgia: Secondary | ICD-10-CM | POA: Insufficient documentation

## 2011-06-06 DIAGNOSIS — IMO0001 Reserved for inherently not codable concepts without codable children: Secondary | ICD-10-CM | POA: Insufficient documentation

## 2011-06-08 ENCOUNTER — Ambulatory Visit: Payer: BC Managed Care – PPO | Admitting: Physical Therapy

## 2011-06-13 ENCOUNTER — Ambulatory Visit: Payer: BC Managed Care – PPO | Admitting: *Deleted

## 2011-06-15 ENCOUNTER — Ambulatory Visit: Payer: BC Managed Care – PPO | Admitting: Physical Therapy

## 2011-07-11 ENCOUNTER — Encounter: Payer: Self-pay | Admitting: Cardiology

## 2011-08-31 LAB — BASIC METABOLIC PANEL
BUN: 18
CO2: 28
Calcium: 8.9
Chloride: 108
Creatinine, Ser: 1.19
GFR calc Af Amer: 57 — ABNORMAL LOW
GFR calc non Af Amer: 47 — ABNORMAL LOW
Glucose, Bld: 174 — ABNORMAL HIGH
Potassium: 3.4 — ABNORMAL LOW
Sodium: 140

## 2011-08-31 LAB — CBC
HCT: 34.1 — ABNORMAL LOW
Hemoglobin: 11.7 — ABNORMAL LOW
MCHC: 34.2
MCV: 85
Platelets: 229
RBC: 4.02
RDW: 15.5
WBC: 9.6

## 2011-08-31 LAB — DIFFERENTIAL
Basophils Absolute: 0.1
Basophils Relative: 1
Eosinophils Absolute: 0.3
Eosinophils Relative: 3
Lymphocytes Relative: 27
Lymphs Abs: 2.6
Monocytes Absolute: 0.8
Monocytes Relative: 8
Neutro Abs: 5.9
Neutrophils Relative %: 61

## 2011-08-31 LAB — CARDIAC PANEL(CRET KIN+CKTOT+MB+TROPI)
CK, MB: 1.9
Relative Index: 1.7
Total CK: 114
Troponin I: <0.01

## 2011-08-31 LAB — POCT CARDIAC MARKERS
CKMB, poc: 1.1
Myoglobin, poc: 99.1
Operator id: 146091
Troponin i, poc: <0.05

## 2011-09-01 LAB — BASIC METABOLIC PANEL
BUN: 10
BUN: 11
BUN: 13
BUN: 17
BUN: 17
BUN: 19
BUN: 19
BUN: 22
CO2: 27
CO2: 27
CO2: 28
CO2: 28
CO2: 28
CO2: 28
CO2: 29
CO2: 29
Calcium: 8.9
Calcium: 9
Calcium: 9
Calcium: 9
Calcium: 9.1
Calcium: 9.2
Calcium: 9.3
Calcium: 9.4
Chloride: 103
Chloride: 103
Chloride: 103
Chloride: 103
Chloride: 104
Chloride: 105
Chloride: 106
Chloride: 106
Creatinine, Ser: 0.97
Creatinine, Ser: 1.02
Creatinine, Ser: 1.04
Creatinine, Ser: 1.12
Creatinine, Ser: 1.17
Creatinine, Ser: 1.19
Creatinine, Ser: 1.35 — ABNORMAL HIGH
Creatinine, Ser: 1.37 — ABNORMAL HIGH
GFR calc Af Amer: 48 — ABNORMAL LOW
GFR calc Af Amer: 49 — ABNORMAL LOW
GFR calc Af Amer: 57 — ABNORMAL LOW
GFR calc Af Amer: 58 — ABNORMAL LOW
GFR calc Af Amer: >60
GFR calc Af Amer: >60
GFR calc Af Amer: >60
GFR calc Af Amer: >60
GFR calc non Af Amer: 40 — ABNORMAL LOW
GFR calc non Af Amer: 41 — ABNORMAL LOW
GFR calc non Af Amer: 47 — ABNORMAL LOW
GFR calc non Af Amer: 48 — ABNORMAL LOW
GFR calc non Af Amer: 51 — ABNORMAL LOW
GFR calc non Af Amer: 55 — ABNORMAL LOW
GFR calc non Af Amer: 56 — ABNORMAL LOW
GFR calc non Af Amer: 60 — ABNORMAL LOW
Glucose, Bld: 110 — ABNORMAL HIGH
Glucose, Bld: 130 — ABNORMAL HIGH
Glucose, Bld: 137 — ABNORMAL HIGH
Glucose, Bld: 147 — ABNORMAL HIGH
Glucose, Bld: 168 — ABNORMAL HIGH
Glucose, Bld: 172 — ABNORMAL HIGH
Glucose, Bld: 188 — ABNORMAL HIGH
Glucose, Bld: 198 — ABNORMAL HIGH
Potassium: 3.1 — ABNORMAL LOW
Potassium: 3.3 — ABNORMAL LOW
Potassium: 3.3 — ABNORMAL LOW
Potassium: 3.4 — ABNORMAL LOW
Potassium: 3.4 — ABNORMAL LOW
Potassium: 3.6
Potassium: 3.6
Potassium: 3.7
Sodium: 138
Sodium: 138
Sodium: 138
Sodium: 140
Sodium: 140
Sodium: 140
Sodium: 141
Sodium: 142

## 2011-09-01 LAB — POCT I-STAT 3, ART BLOOD GAS (G3+)
Acid-Base Excess: 4 — ABNORMAL HIGH
Acid-Base Excess: 4 — ABNORMAL HIGH
Bicarbonate: 27.8 — ABNORMAL HIGH
Bicarbonate: 28.5 — ABNORMAL HIGH
O2 Saturation: 57
O2 Saturation: 90
Operator id: 176311
Operator id: 176311
TCO2: 29
TCO2: 30
pCO2 arterial: 39.8
pCO2 arterial: 43.3
pH, Arterial: 7.426 — ABNORMAL HIGH
pH, Arterial: 7.452 — ABNORMAL HIGH
pO2, Arterial: 29 — CL
pO2, Arterial: 57 — ABNORMAL LOW

## 2011-09-01 LAB — DIFFERENTIAL
Basophils Absolute: 0
Basophils Relative: 0
Eosinophils Absolute: 0.3
Eosinophils Relative: 4
Lymphocytes Relative: 36
Lymphs Abs: 3.6
Monocytes Absolute: 0.9
Monocytes Relative: 9
Neutro Abs: 5
Neutrophils Relative %: 51

## 2011-09-01 LAB — HEPATIC FUNCTION PANEL
ALT: 19
AST: 17
Albumin: 3.2 — ABNORMAL LOW
Alkaline Phosphatase: 60
Bilirubin, Direct: <0.1
Total Bilirubin: 0.8
Total Protein: 5.8 — ABNORMAL LOW

## 2011-09-01 LAB — CBC
HCT: 33.1 — ABNORMAL LOW
HCT: 34.4 — ABNORMAL LOW
Hemoglobin: 11.3 — ABNORMAL LOW
Hemoglobin: 12
MCHC: 34.3
MCHC: 34.8
MCV: 85.1
MCV: 85.7
Platelets: 225
Platelets: 229
RBC: 3.89
RBC: 4.02
RDW: 15.4
RDW: 15.5
WBC: 8.7
WBC: 9.8

## 2011-09-01 LAB — LIPID PANEL
Cholesterol: 173
HDL: 57
LDL Cholesterol: 97
Total CHOL/HDL Ratio: 3
Triglycerides: 95
VLDL: 19

## 2011-09-01 LAB — POCT I-STAT 3, VENOUS BLOOD GAS (G3P V)
Acid-Base Excess: 4 — ABNORMAL HIGH
Acid-Base Excess: 4 — ABNORMAL HIGH
Acid-base deficit: 1
Bicarbonate: 24.1 — ABNORMAL HIGH
Bicarbonate: 28.8 — ABNORMAL HIGH
Bicarbonate: 29.2 — ABNORMAL HIGH
O2 Saturation: 65
O2 Saturation: 65
O2 Saturation: 70
Operator id: 176311
Operator id: 176311
Operator id: 176311
TCO2: 25
TCO2: 30
TCO2: 30
pCO2, Ven: 39.9 — ABNORMAL LOW
pCO2, Ven: 42.2 — ABNORMAL LOW
pCO2, Ven: 43.4 — ABNORMAL LOW
pH, Ven: 7.388 — ABNORMAL HIGH
pH, Ven: 7.436 — ABNORMAL HIGH
pH, Ven: 7.443 — ABNORMAL HIGH
pO2, Ven: 33
pO2, Ven: 33
pO2, Ven: 37

## 2011-09-01 LAB — PROTIME-INR
INR: 1
Prothrombin Time: 13

## 2011-09-01 LAB — HEMOGLOBIN A1C
Hgb A1c MFr Bld: 7 — ABNORMAL HIGH
Mean Plasma Glucose: 172

## 2011-09-01 LAB — HEPARIN LEVEL (UNFRACTIONATED)
Heparin Unfractionated: 0.55
Heparin Unfractionated: <0.1 — ABNORMAL LOW
Heparin Unfractionated: <0.1 — ABNORMAL LOW

## 2011-09-01 LAB — APTT: aPTT: 89 — ABNORMAL HIGH

## 2011-09-01 LAB — D-DIMER, QUANTITATIVE: D-Dimer, Quant: <0.22

## 2011-09-01 LAB — MAGNESIUM: Magnesium: 2.1

## 2011-09-01 LAB — CARDIAC PANEL(CRET KIN+CKTOT+MB+TROPI)
CK, MB: 1.6
Relative Index: INVALID
Total CK: 93
Troponin I: <0.01

## 2011-10-17 ENCOUNTER — Telehealth: Payer: Self-pay | Admitting: Cardiology

## 2011-10-17 NOTE — Telephone Encounter (Signed)
Left message for pt to call back.  Dr Percival Spanish not scheduled to be in [place] again until 11/28.  pts symptoms need to be addressed prior to then.

## 2011-10-17 NOTE — Telephone Encounter (Signed)
Pt called and said she has been having gas pains under her breast bone on the left side. She said it has been going on for a couple of days please call She wants to see Dr Percival Spanish in [place]

## 2011-10-19 NOTE — Telephone Encounter (Signed)
Pt called back and scheduled an appointment for 12/18 in the [place] office

## 2011-11-21 ENCOUNTER — Ambulatory Visit (INDEPENDENT_AMBULATORY_CARE_PROVIDER_SITE_OTHER): Payer: BC Managed Care – PPO | Admitting: Cardiology

## 2011-11-21 ENCOUNTER — Encounter: Payer: Self-pay | Admitting: Cardiology

## 2011-11-21 VITALS — BP 150/70 | HR 61 | Ht 64.0 in | Wt 214.0 lb

## 2011-11-21 DIAGNOSIS — I10 Essential (primary) hypertension: Secondary | ICD-10-CM

## 2011-11-21 DIAGNOSIS — I2510 Atherosclerotic heart disease of native coronary artery without angina pectoris: Secondary | ICD-10-CM

## 2011-11-21 NOTE — Progress Notes (Signed)
HPI The patient presents for follow up of CAD.  She is going to have surgery on her back.  Last saw her early this year prior to cervical neck surgery. She had a stress perfusion study which demonstrated no evidence of ischemia or infarct. Since that time she has had no change in her cardiovascular symptoms. She is limited by back pain but she can still walk at x25 yards without pain. With this level of activity she denies any chest pressure, neck or arm discomfort. She has no palpitations, presyncope or syncope. She will have occasional shortness of breath and trouble swallowing which she relates to her neck surgery.  Allergies  Allergen Reactions  . Ace Inhibitors   . Clopidogrel Bisulfate     REACTION: Sick, Headache, "Felt terrible"  . Codeine     REACTION: hallucinations  . Penicillins     Current Outpatient Prescriptions  Medication Sig Dispense Refill  . aspirin 81 MG EC tablet Take 81 mg by mouth daily.        [name] atenolol (TENORMIN) 50 MG tablet Take 50 mg by mouth 2 (two) times daily.        . Cholecalciferol (VITAMIN D) 2000 UNITS CAPS Take by mouth.        . gabapentin (NEURONTIN) 300 MG capsule as needed.      [name] glimepiride (AMARYL) 4 MG tablet Take 4 mg by mouth daily before breakfast.        . HYDROcodone-acetaminophen (VICODIN) 5-500 MG per tablet as needed.      . insulin detemir (LEVEMIR) 100 UNIT/ML injection Inject 16 Units into the skin at bedtime.        . insulin lispro (HUMALOG) 100 UNIT/ML injection Inject 6 Units into the skin. 6 units for blood sugar readings greater than 300.       [name] NIFEdipine (PROCARDIA XL/ADALAT-CC) 30 MG 24 hr tablet Take 30 mg by mouth daily.        [name] NIFEdipine (PROCARDIA XL/ADALAT-CC) 60 MG 24 hr tablet Take 1 tablet by mouth Daily.      . pioglitazone (ACTOS) 30 MG tablet Take 30 mg by mouth daily.        . rosuvastatin (CRESTOR) 20 MG tablet Take 20 mg by mouth daily.        . valsartan-hydrochlorothiazide (DIOVAN-HCT) 320-25 MG per  tablet Take 1 tablet by mouth daily.          Past Medical History  Diagnosis Date  . CAD (coronary artery disease)     Stenting in 2005. 2009 cardiac cath. Demonstrated questionable mitral regurgitation. Left main was free of critical disease, the LAD had mild plaquing, circumflex had some mild luminal irregularities, there is ramus intermediate with 40% stenosis prior to stent. The stent itself was widely patent, the RCA had 40% proximal stenosis.  . Esophageal stricture   . Non-insulin dependent diabetes mellitus   . Hyperlipidemia   . Hypertension   . Bronchitis     Past Surgical History  Procedure Date  . Cardiac catheterization 2009  . Bladder surgery     Tack  . Partial hysterectomy   . Shoulder surgery     Right  . Cervical spine surgery     ROS:  As stated in the HPI and negative for all other systems.  PHYSICAL EXAM BP 150/70  Pulse 61  Ht 5\' 4"  (1.626 m)  Wt 214 lb (97.07 kg)  BMI 36.73 kg/m2 GENERAL:  Well appearing HEENT:  Pupils equal  round and reactive, fundi not visualized, oral mucosa unremarkable NECK:  No jugular venous distention, waveform within normal limits, carotid upstroke brisk and symmetric, no bruits, no thyromegaly LYMPHATICS:  No cervical, inguinal adenopathy LUNGS:  Clear to auscultation bilaterally BACK:  No CVA tenderness CHEST:  Unremarkable HEART:  PMI not displaced or sustained,S1 and S2 within normal limits, no S3, no S4, no clicks, no rubs, no murmurs ABD:  Flat, positive bowel sounds normal in frequency in pitch, no bruits, no rebound, no guarding, no midline pulsatile mass, no hepatomegaly, no splenomegaly EXT:  2 plus pulses throughout, no edema, no cyanosis no clubbing SKIN:  No rashes no nodules NEURO:  Cranial nerves II through XII grossly intact, motor grossly intact throughout PSYCH:  Cognitively intact, oriented to person place and time  EKG:  Sinus rhythm, rate 61, leftward axis, poor anterior R wave progression, no acute  ST-T wave changes.  ASSESSMENT AND PLAN

## 2011-11-21 NOTE — Patient Instructions (Signed)
The current medical regimen is effective;  continue present plan and medications.  Follow up in 1 year with Dr. Hochrein in [place].  You will receive a letter in the mail 2 months before you are due.  Please call us when you receive this letter to schedule your follow up appointment.  

## 2011-11-21 NOTE — Assessment & Plan Note (Signed)
Her blood pressure is elevated. I would like to see her blood pressure diary. She needs weight loss and other therapeutic lifestyle changes. If her blood pressure diary reflects continued blood pressure elevation she'll need to have mid titration.

## 2011-11-21 NOTE — Assessment & Plan Note (Signed)
She's having no new symptoms since her stress perfusion study. No further cardiovascular testing would be suggested. She should continue with risk reduction. Her new primary provider is apparently getting very on top of the diabetes and dyslipidemia. I think this is excellent. According to ACC/AHA guidelines the patient would be at acceptable risk for back surgery without any cardiovascular testing further.

## 2011-12-05 DIAGNOSIS — E669 Obesity, unspecified: Secondary | ICD-10-CM | POA: Insufficient documentation

## 2011-12-15 ENCOUNTER — Other Ambulatory Visit: Payer: Self-pay | Admitting: Neurosurgery

## 2011-12-15 ENCOUNTER — Encounter (HOSPITAL_COMMUNITY): Payer: Self-pay | Admitting: Pharmacy Technician

## 2011-12-25 ENCOUNTER — Encounter (HOSPITAL_COMMUNITY): Payer: Self-pay

## 2011-12-25 ENCOUNTER — Encounter (HOSPITAL_COMMUNITY)
Admission: RE | Admit: 2011-12-25 | Discharge: 2011-12-25 | Disposition: A | Discharge status: Home / Self Care | Payer: BC Managed Care – PPO | Source: Ambulatory Visit | Attending: Neurosurgery | Admitting: Neurosurgery

## 2011-12-25 HISTORY — DX: Chronic kidney disease, unspecified: N18.9

## 2011-12-25 HISTORY — DX: Unspecified osteoarthritis, unspecified site: M19.90

## 2011-12-25 LAB — CBC
HCT: 35.5 % — ABNORMAL LOW (ref 36.0–46.0)
Hemoglobin: 11.6 g/dL — ABNORMAL LOW (ref 12.0–15.0)
MCH: 26.9 pg (ref 26.0–34.0)
MCHC: 32.7 g/dL (ref 30.0–36.0)
MCV: 82.4 fL (ref 78.0–100.0)
Platelets: 293 10*3/uL (ref 150–400)
RBC: 4.31 MIL/uL (ref 3.87–5.11)
RDW: 16.5 % — ABNORMAL HIGH (ref 11.5–15.5)
WBC: 8.9 10*3/uL (ref 4.0–10.5)

## 2011-12-25 LAB — COMPREHENSIVE METABOLIC PANEL
ALT: 17 U/L (ref 0–35)
AST: 13 U/L (ref 0–37)
Albumin: 3.2 g/dL — ABNORMAL LOW (ref 3.5–5.2)
Alkaline Phosphatase: 78 U/L (ref 39–117)
BUN: 43 mg/dL — ABNORMAL HIGH (ref 6–23)
CO2: 27 mEq/L (ref 19–32)
Calcium: 8.8 mg/dL (ref 8.4–10.5)
Chloride: 104 mEq/L (ref 96–112)
Creatinine, Ser: 1.75 mg/dL — ABNORMAL HIGH (ref 0.50–1.10)
GFR calc Af Amer: 36 mL/min — ABNORMAL LOW (ref >90)
GFR calc non Af Amer: 31 mL/min — ABNORMAL LOW (ref >90)
Glucose, Bld: 307 mg/dL — ABNORMAL HIGH (ref 70–99)
Potassium: 3.6 mEq/L (ref 3.5–5.1)
Sodium: 140 mEq/L (ref 135–145)
Total Bilirubin: 0.2 mg/dL — ABNORMAL LOW (ref 0.3–1.2)
Total Protein: 6.7 g/dL (ref 6.0–8.3)

## 2011-12-25 LAB — SURGICAL PCR SCREEN
MRSA, PCR: NEGATIVE
Staphylococcus aureus: NEGATIVE

## 2011-12-25 NOTE — Pre-Procedure Instructions (Signed)
[place]  12/25/2011   Your procedure is scheduled on: Friday, January 25TH**  Report to [facility] [place] at *5:30 AM.  Call this number if you have problems the morning of surgery: [phone]   Remember:   Do not eat food:After Midnight.THURSDAY  May have clear liquids: up to 4 Hours before arrival UP UNTIL 1:30 AM.  Clear liquids include soda, tea, black coffee, apple or grape juice, broth.   Take these medicines the morning of surgery with A SIP OF WATER: NEURONTIN,              ATENOLOL.   Do not wear jewelry, make-up or nail polish.  Do not wear lotions, powders, or perfumes. You may wear deodorant.   Do not shave 48 hours prior to surgery.   Do not bring valuables to the hospital.  Contacts, dentures or bridgework may not be worn into surgery.  Leave suitcase in the car. After surgery it may be brought to your room.  For patients admitted to the hospital, checkout time is 11:00 AM the day of discharge.   Patients discharged the day of surgery will not be allowed to drive home.  Name and phone number of your driver: [name] -- DAUGHTER**  Special Instructions: CHG Shower Use Special Wash: 1/2 bottle night before surgery and 1/2 bottle morning of surgery.   Please read over the following fact sheets that you were given: Pain Booklet, MRSA Information and Surgical Site Infection Prevention

## 2011-12-28 MED ORDER — VANCOMYCIN HCL IN DEXTROSE 1-5 GM/200ML-% IV SOLN
1000.0000 mg | INTRAVENOUS | Status: AC
  Administered 2011-12-29: 1000 mg via INTRAVENOUS
  Filled 2011-12-28: qty 200

## 2011-12-29 ENCOUNTER — Ambulatory Visit (HOSPITAL_COMMUNITY): Payer: BC Managed Care – PPO | Admitting: Certified Registered Nurse Anesthetist

## 2011-12-29 ENCOUNTER — Inpatient Hospital Stay (HOSPITAL_COMMUNITY)
Admission: RE | Admit: 2011-12-29 | Discharge: 2012-01-05 | DRG: 757 | Disposition: A | Discharge status: Home Health | Payer: BC Managed Care – PPO | Source: Ambulatory Visit | Attending: Neurosurgery | Admitting: Neurosurgery

## 2011-12-29 ENCOUNTER — Encounter (HOSPITAL_COMMUNITY): Payer: Self-pay | Admitting: Certified Registered Nurse Anesthetist

## 2011-12-29 ENCOUNTER — Encounter (HOSPITAL_COMMUNITY): Payer: Self-pay | Admitting: *Deleted

## 2011-12-29 ENCOUNTER — Ambulatory Visit (HOSPITAL_COMMUNITY): Payer: BC Managed Care – PPO

## 2011-12-29 ENCOUNTER — Encounter (HOSPITAL_COMMUNITY)
Admission: RE | Disposition: A | Discharge status: Home Health | Payer: Self-pay | Source: Ambulatory Visit | Attending: Neurosurgery

## 2011-12-29 DIAGNOSIS — Z888 Allergy status to other drugs, medicaments and biological substances status: Secondary | ICD-10-CM

## 2011-12-29 DIAGNOSIS — E11649 Type 2 diabetes mellitus with hypoglycemia without coma: Secondary | ICD-10-CM | POA: Diagnosis not present

## 2011-12-29 DIAGNOSIS — Z794 Long term (current) use of insulin: Secondary | ICD-10-CM

## 2011-12-29 DIAGNOSIS — M25519 Pain in unspecified shoulder: Secondary | ICD-10-CM | POA: Diagnosis present

## 2011-12-29 DIAGNOSIS — E785 Hyperlipidemia, unspecified: Secondary | ICD-10-CM | POA: Diagnosis present

## 2011-12-29 DIAGNOSIS — I129 Hypertensive chronic kidney disease with stage 1 through stage 4 chronic kidney disease, or unspecified chronic kidney disease: Secondary | ICD-10-CM | POA: Diagnosis present

## 2011-12-29 DIAGNOSIS — E1159 Type 2 diabetes mellitus with other circulatory complications: Secondary | ICD-10-CM | POA: Diagnosis present

## 2011-12-29 DIAGNOSIS — E1169 Type 2 diabetes mellitus with other specified complication: Secondary | ICD-10-CM | POA: Diagnosis present

## 2011-12-29 DIAGNOSIS — E1121 Type 2 diabetes mellitus with diabetic nephropathy: Secondary | ICD-10-CM | POA: Diagnosis present

## 2011-12-29 DIAGNOSIS — Z9861 Coronary angioplasty status: Secondary | ICD-10-CM

## 2011-12-29 DIAGNOSIS — I2510 Atherosclerotic heart disease of native coronary artery without angina pectoris: Secondary | ICD-10-CM | POA: Diagnosis present

## 2011-12-29 DIAGNOSIS — K5900 Constipation, unspecified: Secondary | ICD-10-CM | POA: Diagnosis not present

## 2011-12-29 DIAGNOSIS — N189 Chronic kidney disease, unspecified: Secondary | ICD-10-CM | POA: Diagnosis present

## 2011-12-29 DIAGNOSIS — Z7982 Long term (current) use of aspirin: Secondary | ICD-10-CM

## 2011-12-29 DIAGNOSIS — Z90712 Acquired absence of cervix with remaining uterus: Secondary | ICD-10-CM

## 2011-12-29 DIAGNOSIS — M1710 Unilateral primary osteoarthritis, unspecified knee: Secondary | ICD-10-CM | POA: Diagnosis present

## 2011-12-29 DIAGNOSIS — Z87891 Personal history of nicotine dependence: Secondary | ICD-10-CM

## 2011-12-29 DIAGNOSIS — N183 Chronic kidney disease, stage 3 unspecified: Secondary | ICD-10-CM | POA: Diagnosis present

## 2011-12-29 DIAGNOSIS — Z880 Allergy status to penicillin: Secondary | ICD-10-CM

## 2011-12-29 DIAGNOSIS — M48062 Spinal stenosis, lumbar region with neurogenic claudication: Principal | ICD-10-CM | POA: Diagnosis present

## 2011-12-29 DIAGNOSIS — I10 Essential (primary) hypertension: Secondary | ICD-10-CM

## 2011-12-29 DIAGNOSIS — Z01812 Encounter for preprocedural laboratory examination: Secondary | ICD-10-CM

## 2011-12-29 DIAGNOSIS — E1140 Type 2 diabetes mellitus with diabetic neuropathy, unspecified: Secondary | ICD-10-CM

## 2011-12-29 DIAGNOSIS — Z8673 Personal history of transient ischemic attack (TIA), and cerebral infarction without residual deficits: Secondary | ICD-10-CM

## 2011-12-29 DIAGNOSIS — Z886 Allergy status to analgesic agent status: Secondary | ICD-10-CM

## 2011-12-29 HISTORY — PX: LUMBAR LAMINECTOMY/DECOMPRESSION MICRODISCECTOMY: SHX5026

## 2011-12-29 HISTORY — PX: LAMINECTOMY: SHX219

## 2011-12-29 LAB — GLUCOSE, CAPILLARY
Glucose-Capillary: 107 mg/dL — ABNORMAL HIGH (ref 70–99)
Glucose-Capillary: 143 mg/dL — ABNORMAL HIGH (ref 70–99)
Glucose-Capillary: 241 mg/dL — ABNORMAL HIGH (ref 70–99)
Glucose-Capillary: 120 mg/dL — ABNORMAL HIGH (ref 70–99)

## 2011-12-29 SURGERY — LUMBAR LAMINECTOMY/DECOMPRESSION MICRODISCECTOMY
Anesthesia: General | Wound class: Clean

## 2011-12-29 MED ORDER — MIDAZOLAM HCL 5 MG/5ML IJ SOLN
INTRAMUSCULAR | Status: DC | PRN
  Administered 2011-12-29: 2 mg via INTRAVENOUS

## 2011-12-29 MED ORDER — CEFAZOLIN SODIUM 1-5 GM-% IV SOLN: 1.0000 g | Freq: Three times a day (TID) | INTRAVENOUS | Status: DC

## 2011-12-29 MED ORDER — BUPIVACAINE LIPOSOME 1.3 % IJ SUSP
INTRAMUSCULAR | Status: DC | PRN
  Administered 2011-12-29: 20 mL

## 2011-12-29 MED ORDER — THROMBIN 5000 UNITS EX SOLR
CUTANEOUS | Status: DC | PRN
  Administered 2011-12-29: 5000 [IU] via TOPICAL

## 2011-12-29 MED ORDER — GABAPENTIN 300 MG PO CAPS
300.0000 mg | ORAL_CAPSULE | Freq: Three times a day (TID) | ORAL | Status: DC
  Administered 2011-12-29 – 2012-01-05 (×21): 300 mg via ORAL
  Filled 2011-12-29 (×23): qty 1

## 2011-12-29 MED ORDER — VANCOMYCIN HCL IN DEXTROSE 1-5 GM/200ML-% IV SOLN
1000.0000 mg | Freq: Once | INTRAVENOUS | Status: AC
  Administered 2011-12-29: 1000 mg via INTRAVENOUS
  Filled 2011-12-29: qty 200

## 2011-12-29 MED ORDER — CYCLOBENZAPRINE HCL 10 MG PO TABS
10.0000 mg | ORAL_TABLET | Freq: Three times a day (TID) | ORAL | Status: DC | PRN
  Administered 2011-12-29 – 2012-01-05 (×8): 10 mg via ORAL
  Filled 2011-12-29 (×9): qty 1

## 2011-12-29 MED ORDER — NIFEDIPINE ER OSMOTIC RELEASE 90 MG PO TB24
90.0000 mg | ORAL_TABLET | Freq: Every day | ORAL | Status: DC
Start: 2011-12-29 — End: 2012-01-05
  Administered 2011-12-29 – 2012-01-05 (×8): 90 mg via ORAL
  Filled 2011-12-29 (×8): qty 1

## 2011-12-29 MED ORDER — HYDROMORPHONE HCL PF 1 MG/ML IJ SOLN
0.2500 mg | INTRAMUSCULAR | Status: DC | PRN
  Administered 2011-12-29 (×4): 0.5 mg via INTRAVENOUS

## 2011-12-29 MED ORDER — HEMOSTATIC AGENTS (NO CHARGE) OPTIME
TOPICAL | Status: DC | PRN
  Administered 2011-12-29: 1 via TOPICAL

## 2011-12-29 MED ORDER — SODIUM CHLORIDE 0.9 % IV SOLN
INTRAVENOUS | Status: DC
  Administered 2011-12-29: 12:00:00 via INTRAVENOUS

## 2011-12-29 MED ORDER — PROPOFOL 10 MG/ML IV EMUL
INTRAVENOUS | Status: DC | PRN
  Administered 2011-12-29: 150 mg via INTRAVENOUS

## 2011-12-29 MED ORDER — BUPIVACAINE LIPOSOME 1.3 % IJ SUSP
20.0000 mL | INTRAMUSCULAR | Status: AC
  Filled 2011-12-29: qty 20

## 2011-12-29 MED ORDER — ONDANSETRON HCL 4 MG/2ML IJ SOLN
INTRAMUSCULAR | Status: DC | PRN
  Administered 2011-12-29: 4 mg via INTRAVENOUS

## 2011-12-29 MED ORDER — MENTHOL 3 MG MT LOZG: 1.0000 | LOZENGE | OROMUCOSAL | Status: DC | PRN

## 2011-12-29 MED ORDER — GLIMEPIRIDE 4 MG PO TABS
4.0000 mg | ORAL_TABLET | Freq: Two times a day (BID) | ORAL | Status: DC
  Administered 2011-12-29 – 2012-01-01 (×6): 4 mg via ORAL
  Filled 2011-12-29 (×10): qty 1

## 2011-12-29 MED ORDER — ZOLPIDEM TARTRATE 5 MG PO TABS
5.0000 mg | ORAL_TABLET | Freq: Every evening | ORAL | Status: DC | PRN
  Administered 2011-12-29: 5 mg via ORAL
  Filled 2011-12-29: qty 1

## 2011-12-29 MED ORDER — HYDROMORPHONE HCL PF 1 MG/ML IJ SOLN
INTRAMUSCULAR | Status: AC
  Filled 2011-12-29: qty 1

## 2011-12-29 MED ORDER — MEPERIDINE HCL 25 MG/ML IJ SOLN: 6.2500 mg | INTRAMUSCULAR | Status: DC | PRN

## 2011-12-29 MED ORDER — KETOROLAC TROMETHAMINE 30 MG/ML IJ SOLN: 15.0000 mg | Freq: Once | INTRAMUSCULAR | Status: DC | PRN

## 2011-12-29 MED ORDER — LACTATED RINGERS IV SOLN
INTRAVENOUS | Status: DC | PRN
  Administered 2011-12-29: 08:00:00 via INTRAVENOUS

## 2011-12-29 MED ORDER — PIOGLITAZONE HCL 30 MG PO TABS
30.0000 mg | ORAL_TABLET | Freq: Every day | ORAL | Status: DC
  Administered 2011-12-29 – 2012-01-05 (×8): 30 mg via ORAL
  Filled 2011-12-29 (×8): qty 1

## 2011-12-29 MED ORDER — OXYCODONE-ACETAMINOPHEN 5-325 MG PO TABS
1.0000 | ORAL_TABLET | ORAL | Status: DC | PRN
  Administered 2011-12-29 – 2012-01-04 (×22): 2 via ORAL
  Administered 2012-01-05: 1 via ORAL
  Administered 2012-01-05: 2 via ORAL
  Filled 2011-12-29: qty 1
  Filled 2011-12-29 (×17): qty 2
  Filled 2011-12-29: qty 1
  Filled 2011-12-29 (×6): qty 2

## 2011-12-29 MED ORDER — ONDANSETRON HCL 4 MG/2ML IJ SOLN: 4.0000 mg | INTRAMUSCULAR | Status: DC | PRN

## 2011-12-29 MED ORDER — INSULIN DETEMIR 100 UNIT/ML ~~LOC~~ SOLN
27.0000 [IU] | Freq: Every day | SUBCUTANEOUS | Status: DC
  Administered 2011-12-29 – 2011-12-31 (×3): 27 [IU] via SUBCUTANEOUS
  Filled 2011-12-29: qty 3

## 2011-12-29 MED ORDER — FENTANYL CITRATE 0.05 MG/ML IJ SOLN
INTRAMUSCULAR | Status: DC | PRN
  Administered 2011-12-29: 100 ug via INTRAVENOUS
  Administered 2011-12-29: 50 ug via INTRAVENOUS
  Administered 2011-12-29: 100 ug via INTRAVENOUS

## 2011-12-29 MED ORDER — SODIUM CHLORIDE 0.9 % IV SOLN
INTRAVENOUS | Status: DC | PRN
  Administered 2011-12-29: 10:00:00 via INTRAVENOUS

## 2011-12-29 MED ORDER — MORPHINE SULFATE 4 MG/ML IJ SOLN
4.0000 mg | INTRAMUSCULAR | Status: DC | PRN
  Administered 2011-12-29 – 2012-01-04 (×7): 4 mg via INTRAVENOUS
  Filled 2011-12-29 (×7): qty 1

## 2011-12-29 MED ORDER — ROSUVASTATIN CALCIUM 20 MG PO TABS
20.0000 mg | ORAL_TABLET | Freq: Every day | ORAL | Status: DC
Start: 2011-12-29 — End: 2012-01-05
  Administered 2011-12-29 – 2012-01-04 (×7): 20 mg via ORAL
  Filled 2011-12-29 (×8): qty 1

## 2011-12-29 MED ORDER — GLYCOPYRROLATE 0.2 MG/ML IJ SOLN
INTRAMUSCULAR | Status: DC | PRN
  Administered 2011-12-29: .8 mg via INTRAVENOUS

## 2011-12-29 MED ORDER — PHENOL 1.4 % MT LIQD: 1.0000 | OROMUCOSAL | Status: DC | PRN

## 2011-12-29 MED ORDER — PROMETHAZINE HCL 25 MG/ML IJ SOLN: 6.2500 mg | INTRAMUSCULAR | Status: DC | PRN

## 2011-12-29 MED ORDER — SODIUM CHLORIDE 0.9 % IJ SOLN: 3.0000 mL | INTRAMUSCULAR | Status: DC | PRN

## 2011-12-29 MED ORDER — ATENOLOL 50 MG PO TABS
50.0000 mg | ORAL_TABLET | Freq: Two times a day (BID) | ORAL | Status: DC
  Administered 2011-12-29 – 2012-01-05 (×14): 50 mg via ORAL
  Filled 2011-12-29 (×16): qty 1

## 2011-12-29 MED ORDER — ACETAMINOPHEN 650 MG RE SUPP: 650.0000 mg | RECTAL | Status: DC | PRN

## 2011-12-29 MED ORDER — NEOSTIGMINE METHYLSULFATE 1 MG/ML IJ SOLN
INTRAMUSCULAR | Status: DC | PRN
  Administered 2011-12-29: 5 mg via INTRAVENOUS

## 2011-12-29 MED ORDER — 0.9 % SODIUM CHLORIDE (POUR BTL) OPTIME
TOPICAL | Status: DC | PRN
  Administered 2011-12-29: 1000 mL

## 2011-12-29 MED ORDER — ROCURONIUM BROMIDE 100 MG/10ML IV SOLN
INTRAVENOUS | Status: DC | PRN
  Administered 2011-12-29: 50 mg via INTRAVENOUS

## 2011-12-29 MED ORDER — SODIUM CHLORIDE 0.9 % IV SOLN: 250.0000 mL | INTRAVENOUS | Status: DC

## 2011-12-29 MED ORDER — DOCUSATE SODIUM 100 MG PO CAPS
100.0000 mg | ORAL_CAPSULE | Freq: Two times a day (BID) | ORAL | Status: DC
  Administered 2011-12-29 – 2012-01-05 (×15): 100 mg via ORAL
  Filled 2011-12-29 (×10): qty 1

## 2011-12-29 MED ORDER — SODIUM CHLORIDE 0.9 % IJ SOLN
3.0000 mL | Freq: Two times a day (BID) | INTRAMUSCULAR | Status: DC
  Administered 2011-12-29 – 2012-01-05 (×10): 3 mL via INTRAVENOUS

## 2011-12-29 MED ORDER — INSULIN DETEMIR 100 UNIT/ML ~~LOC~~ SOLN
27.0000 [IU] | Freq: Every day | SUBCUTANEOUS | Status: DC
  Filled 2011-12-29: qty 3

## 2011-12-29 MED ORDER — SORBITOL 70 % SOLN
30.0000 mL | Freq: Every day | Status: DC | PRN
  Administered 2012-01-01: 30 mL via ORAL
  Filled 2011-12-29 (×2): qty 30

## 2011-12-29 MED ORDER — ACETAMINOPHEN 325 MG PO TABS
650.0000 mg | ORAL_TABLET | ORAL | Status: DC | PRN
  Administered 2011-12-31: 650 mg via ORAL
  Filled 2011-12-29: qty 2

## 2011-12-29 SURGICAL SUPPLY — 57 items
APL SKNCLS STERI-STRIP NONHPOA (GAUZE/BANDAGES/DRESSINGS) ×1
BENZOIN TINCTURE PRP APPL 2/3 (GAUZE/BANDAGES/DRESSINGS) ×2 IMPLANT
BLADE SURG ROTATE 9660 (MISCELLANEOUS) IMPLANT
BUR ACORN 6.0 (BURR) IMPLANT
BUR MATCHSTICK NEURO 3.0 LAGG (BURR) ×2 IMPLANT
CANISTER SUCTION 2500CC (MISCELLANEOUS) ×2 IMPLANT
CLOTH BEACON ORANGE TIMEOUT ST (SAFETY) ×2 IMPLANT
CONT SPEC 4OZ CLIKSEAL STRL BL (MISCELLANEOUS) ×2 IMPLANT
DRAPE LAPAROTOMY 100X72X124 (DRAPES) ×2 IMPLANT
DRAPE MICROSCOPE LEICA (MISCELLANEOUS) ×2 IMPLANT
DRAPE POUCH INSTRU U-SHP 10X18 (DRAPES) ×2 IMPLANT
DRSG PAD ABDOMINAL 8X10 ST (GAUZE/BANDAGES/DRESSINGS) ×1 IMPLANT
DURAPREP 26ML APPLICATOR (WOUND CARE) ×2 IMPLANT
ELECT BLADE 4.0 EZ CLEAN MEGAD (MISCELLANEOUS) ×2
ELECT REM PT RETURN 9FT ADLT (ELECTROSURGICAL) ×2
ELECTRODE BLDE 4.0 EZ CLN MEGD (MISCELLANEOUS) IMPLANT
ELECTRODE REM PT RTRN 9FT ADLT (ELECTROSURGICAL) ×1 IMPLANT
GAUZE SPONGE 4X4 12PLY STRL LF (GAUZE/BANDAGES/DRESSINGS) ×1 IMPLANT
GAUZE SPONGE 4X4 16PLY XRAY LF (GAUZE/BANDAGES/DRESSINGS) ×1 IMPLANT
GLOVE BIOGEL M 8.0 STRL (GLOVE) ×2 IMPLANT
GLOVE EXAM NITRILE LRG STRL (GLOVE) IMPLANT
GLOVE EXAM NITRILE MD LF STRL (GLOVE) IMPLANT
GLOVE EXAM NITRILE XL STR (GLOVE) IMPLANT
GLOVE EXAM NITRILE XS STR PU (GLOVE) IMPLANT
GLOVE SURG SS PI 6.5 STRL IVOR (GLOVE) ×1 IMPLANT
GOWN BRE IMP SLV AUR LG STRL (GOWN DISPOSABLE) ×2 IMPLANT
GOWN BRE IMP SLV AUR XL STRL (GOWN DISPOSABLE) IMPLANT
GOWN STRL REIN 2XL LVL4 (GOWN DISPOSABLE) IMPLANT
KIT BASIN OR (CUSTOM PROCEDURE TRAY) ×2 IMPLANT
KIT ROOM TURNOVER OR (KITS) ×2 IMPLANT
NDL HYPO 18GX1.5 BLUNT FILL (NEEDLE) IMPLANT
NDL HYPO 21X1.5 SAFETY (NEEDLE) IMPLANT
NDL HYPO 25X1 1.5 SAFETY (NEEDLE) IMPLANT
NDL SPNL 20GX3.5 QUINCKE YW (NEEDLE) IMPLANT
NEEDLE HYPO 18GX1.5 BLUNT FILL (NEEDLE) IMPLANT
NEEDLE HYPO 21X1.5 SAFETY (NEEDLE) IMPLANT
NEEDLE HYPO 25X1 1.5 SAFETY (NEEDLE) ×2 IMPLANT
NEEDLE SPNL 20GX3.5 QUINCKE YW (NEEDLE) ×2 IMPLANT
NS IRRIG 1000ML POUR BTL (IV SOLUTION) ×2 IMPLANT
PACK LAMINECTOMY NEURO (CUSTOM PROCEDURE TRAY) ×2 IMPLANT
PAD ARMBOARD 7.5X6 YLW CONV (MISCELLANEOUS) ×6 IMPLANT
PATTIES SURGICAL .5 X1 (DISPOSABLE) ×2 IMPLANT
RUBBERBAND STERILE (MISCELLANEOUS) ×4 IMPLANT
SPONGE GAUZE 4X4 12PLY (GAUZE/BANDAGES/DRESSINGS) ×2 IMPLANT
SPONGE LAP 4X18 X RAY DECT (DISPOSABLE) IMPLANT
SPONGE SURGIFOAM ABS GEL SZ50 (HEMOSTASIS) ×2 IMPLANT
STRIP CLOSURE SKIN 1/2X4 (GAUZE/BANDAGES/DRESSINGS) ×3 IMPLANT
SUT VIC AB 0 CT1 18XCR BRD8 (SUTURE) ×1 IMPLANT
SUT VIC AB 0 CT1 8-18 (SUTURE) ×4
SUT VIC AB 2-0 CP2 18 (SUTURE) ×3 IMPLANT
SUT VIC AB 3-0 SH 8-18 (SUTURE) ×3 IMPLANT
SYR 20CC LL (SYRINGE) ×1 IMPLANT
SYR 20ML ECCENTRIC (SYRINGE) ×2 IMPLANT
SYR 5ML LL (SYRINGE) IMPLANT
TOWEL OR 17X24 6PK STRL BLUE (TOWEL DISPOSABLE) ×2 IMPLANT
TOWEL OR 17X26 10 PK STRL BLUE (TOWEL DISPOSABLE) ×2 IMPLANT
WATER STERILE IRR 1000ML POUR (IV SOLUTION) ×2 IMPLANT

## 2011-12-29 NOTE — Transfer of Care (Cosign Needed)
Immediate Anesthesia Transfer of Care Note  Patient: [name]  Procedure(s) Performed:  LUMBAR LAMINECTOMY/DECOMPRESSION MICRODISCECTOMY - Lumbar Two through Lumbar Five Laminectomies/Cellsaver  Patient Location: PACU  Anesthesia Type: General  Level of Consciousness: awake, alert  and oriented  Airway & Oxygen Therapy: Patient Spontanous Breathing and Patient connected to nasal cannula oxygen  Post-op Assessment: Report given to PACU RN and Post -op Vital signs reviewed and stable  Post vital signs: Reviewed and stable  Complications: No apparent anesthesia complications

## 2011-12-29 NOTE — Op Note (Signed)
[name], [name] NO.:  [id number]  MEDICAL RECORD NO.:  [id number]  LOCATION:  [id number]                         FACILITY:  [facility]  PHYSICIAN:  Leeroy Cha, M.D.   DATE OF BIRTH:  28-Dec-1950  DATE OF PROCEDURE:  12/29/2011 DATE OF DISCHARGE:                              OPERATIVE REPORT   PREOPERATIVE DIAGNOSES:  Lumbar stenosis with neurogenic claudication. Chronic radiculopathy.  POSTOPERATIVE DIAGNOSES:  Lumbar stenosis with neurogenic claudication. Chronic radiculopathy.  PROCEDURE:  Bilateral L2, 3, 4, 5 laminectomy and foraminotomy, decompression of the thecal sac.  Microscope.  SURGEON:  Leeroy Cha, M.D.  CLINICAL HISTORY:  [name] is a lady who had been followed for many years.  She already have decompression of cervical spine.  Lately, she had gained so much pain in the both legs to the point that around the house she has to sit because of pain in both legs which gets better with inactivity.  X-rays show severe stenosis from L2-L5.  Surgery was advised.  The risks were fully explained to her and her daughter.  PROCEDURE:  The patient was taken to the OR, and after intubation, she was positioned in a prone manner.  The back was cleaned with DuraPrep and drapes were applied.  Midline incision was made through a thick adipose tissue.  We took an x-ray and showed the tip of the clip was at the level L2 and the other was at level L3.  From then on, we carried our incision laterally into the fascia and the muscle and retraction was made bilaterally.  Then with the Leksell, we will remove the spinous process of 2,3, 4, 5.  Then, we brought the drill into the area.  We drilled a thick lamina from L2-L5.  The patient had quite a bit of thickening of the ligament, worse at the level of 3-4 and 4-5.  The patient has a scar in the right side from previous surgery.  Then using the 1, 2, and 3, and 4 mm Kerrison punch, we start to decompress  the thecal sac medially and then laterally.  The decompression was done with plenty of space for the thecal sac and preserving the facet.  Then, using the 2 and 3 mm Kerrison punch, we did foraminotomy to decompress the L2-3, 4, 5, and S1 nerve root bilaterally.  At the end, we had plenty of space.  Valsalva maneuver was negative.  During the surgery, we used the Cell Saver machine.  From then on, the area was irrigated. Hemovac was left in the epidural space, and the wound was closed with a single stitch of 0 Vicryl Steri-Strips.          ______________________________ Leeroy Cha, M.D.     EB/MEDQ  D:  12/29/2011  T:  12/29/2011  Job:  [id number]

## 2011-12-29 NOTE — Progress Notes (Signed)
Patient ID: [name], female   DOB: 01/20/1951, 61 y.o.   MRN: [id number] [name] had a decompresive laminectomies l2 to l3 osteoporosis note is [phone]. i will get the hospitalist to help with her diabetes.

## 2011-12-29 NOTE — Anesthesia Preprocedure Evaluation (Addendum)
Anesthesia Evaluation  Patient identified by MRN, date of birth, ID band Patient awake    Reviewed: Allergy & Precautions, H&P , NPO status , Patient's Chart, lab work & pertinent test results, reviewed documented beta blocker date and time   Airway Mallampati: I  Neck ROM: Full    Dental  (+) Edentulous Upper and Poor Dentition   Pulmonary former smoker clear to auscultation        Cardiovascular hypertension, Pt. on medications and Pt. 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 Pt states that she is supposed to start seeing a kidney doctor.      Musculoskeletal  (+) Arthritis -, Osteoarthritis,    Abdominal (+) obese,   Peds  Hematology negative hematology ROS (+)   Anesthesia Other Findings   Reproductive/Obstetrics negative OB ROS                        Anesthesia Physical Anesthesia Plan  ASA: III  Anesthesia Plan: General   Post-op Pain Management:    Induction: Intravenous  Airway Management Planned: Oral ETT  Additional Equipment:   Intra-op Plan:   Post-operative Plan: Extubation in OR  Informed Consent: I have reviewed the patients History and Physical, chart, labs and discussed the procedure including the risks, benefits and alternatives for the proposed anesthesia with the patient or authorized representative who has indicated his/her understanding and acceptance.     Plan Discussed with: CRNA and Surgeon  Anesthesia Plan Comments:         Anesthesia Quick Evaluation

## 2011-12-29 NOTE — Anesthesia Postprocedure Evaluation (Signed)
  Anesthesia Post-op Note  Patient: [name]  Procedure(s) Performed:  LUMBAR LAMINECTOMY/DECOMPRESSION MICRODISCECTOMY - Lumbar Two through Lumbar Five Laminectomies/Cellsaver  Patient Location: PACU  Anesthesia Type: General  Level of Consciousness: awake  Airway and Oxygen Therapy: Patient Spontanous Breathing  Post-op Pain: mild  Post-op Assessment: Post-op Vital signs reviewed  Post-op Vital Signs: stable  Complications: No apparent anesthesia complications

## 2011-12-29 NOTE — Progress Notes (Signed)
Inpatient Diabetes Program Recommendations  AACE/ADA: New Consensus Statement on Inpatient Glycemic Control (2009)  Target Ranges:  Prepandial:   less than 140 mg/dL      Peak postprandial:   less than 180 mg/dL (1-2 hours)      Critically ill patients:  140 - 180 mg/dL   Reason for Visit: Consult:  Patient will need basal insulin.  Patient's fasting glucose good this am since took Levemir yesterday at home, but not ordered in hospital.  Inpatient Diabetes Program Recommendations Insulin - Basal: Add home Levemir dose:  27 units daily Correction (SSI): Check CBGs TID and HS while on oral anti-diabetic agents.   Insulin - Meal Coverage: Good noon CBG of 143 mg/dL; however if prandial glucose becomes elevated add Novolg 6 units TID with meals per home med regimen HgbA1C: Check HgbA1C to assess glycemic control.  Note:  May consider adding Novolog Moderate Correction as needed.

## 2011-12-29 NOTE — Anesthesia Procedure Notes (Signed)
Procedure Name: Intubation Date/Time: 12/29/2011 8:06 AM Performed by: Angelique Holm Pre-anesthesia Checklist: Patient identified, Timeout performed, Emergency Drugs available, Suction available and Patient being monitored Patient Re-evaluated:Patient Re-evaluated prior to inductionOxygen Delivery Method: Circle System Utilized Preoxygenation: Pre-oxygenation with 100% oxygen Intubation Type: IV induction Ventilation: Mask ventilation without difficulty and Oral airway inserted - appropriate to patient size Laryngoscope Size: Mac and 3 Grade View: Grade I Tube type: Oral Tube size: 7.0 mm Number of attempts: 1 Airway Equipment and Method: stylet and LTA Placement Confirmation: ETT inserted through vocal cords under direct vision,  positive ETCO2 and breath sounds checked- equal and bilateral Secured at: 22 cm Tube secured with: Tape Dental Injury: Teeth and Oropharynx as per pre-operative assessment

## 2011-12-29 NOTE — H&P (Signed)
[name] is an 61 y.o. female.   Chief Complaint: lbp   HPI:lbp with radiation to both lower extremities which is getting worse up to the point than walking one block she has to sit to get rid of the pain. Had anterio cervical surgery and at that time we foun stenosis frm l2 to l5   Past Medical History  Diagnosis Date  . Esophageal stricture   . Non-insulin dependent diabetes mellitus   . Hyperlipidemia   . Hypertension   . Bronchitis   . CAD (coronary artery disease)     Stenting in 2005. 2009 cardiac cath. Demonstrated questionable mitral regurgitation. Left main was free of critical disease, the LAD had mild plaquing, circumflex had some mild luminal irregularities, there is ramus intermediate with 40% stenosis prior to stent. The stent itself was widely patent, the RCA had 40% proximal stenosis.  . Arthritis   . Chronic kidney disease     CREATININE IS UP--GOING TO KIDNEY MD     Past Surgical History  Procedure Date  . Cardiac catheterization 2009  . Bladder surgery     Tack  . Partial hysterectomy   . Shoulder surgery     Right  . Cervical spine surgery   . Back surgery     2007 & 2012  . Abdominal hysterectomy     Family History  Problem Relation Age of Onset  . Heart disease Other    Social History:  reports that she quit smoking about 10 years ago. She does not have any smokeless tobacco history on file. She reports that she does not drink alcohol or use illicit drugs.  Allergies:  Allergies  Allergen Reactions  . Ace Inhibitors   . Clopidogrel Bisulfate     REACTION: Sick, Headache, "Felt terrible"  . Codeine     REACTION: hallucinations  . Penicillins     Medications Prior to Admission  Medication Dose Route Frequency Provider Last Rate Last Dose  . vancomycin (VANCOCIN) IVPB 1000 mg/200 mL premix  1,000 mg Intravenous 120 min pre-op Tamara Stakes, MD       Medications Prior to Admission  Medication Sig Dispense Refill  . aspirin 81 MG EC  tablet Take 81 mg by mouth daily.        [name] atenolol (TENORMIN) 50 MG tablet Take 50 mg by mouth 2 (two) times daily.        [name] gabapentin (NEURONTIN) 300 MG capsule Take 300 mg by mouth 3 (three) times daily.       [name] glimepiride (AMARYL) 4 MG tablet Take 4 mg by mouth 2 (two) times daily.       . insulin detemir (LEVEMIR) 100 UNIT/ML injection Inject 27 Units into the skin daily.       . insulin lispro (HUMALOG) 100 UNIT/ML injection Inject 6 Units into the skin. 6 units for blood sugar readings greater than 300.       . pioglitazone (ACTOS) 30 MG tablet Take 30 mg by mouth daily.        . rosuvastatin (CRESTOR) 20 MG tablet Take 20 mg by mouth daily.        . valsartan-hydrochlorothiazide (DIOVAN-HCT) 320-25 MG per tablet Take 1 tablet by mouth daily.          Results for orders placed during the hospital encounter of 12/29/11 (from the past 48 hour(s))  GLUCOSE, CAPILLARY     Status: Abnormal   Collection Time   12/29/11  7:16 AM  Component Value Range Comment   Glucose-Capillary 107 (*) 70 - 99 (mg/dL)    No results found.  Review of Systems  Constitutional: Negative.   HENT:       Acd surgery  Eyes: Negative.   Respiratory: Negative.   Cardiovascular:       Hbp  Gastrointestinal:       Dm  Genitourinary: Negative.   Musculoskeletal: Positive for back pain.  Skin: Negative.   Neurological: Negative.   Endo/Heme/Allergies: Negative.   Psychiatric/Behavioral: Negative.     There were no vitals taken for this visit. Physical Exam neck,ant scar.lungs mild wet sounds. Cv,nl. Abdomen,nl.extremities edema grade 1.NEURO WEAHNESS OF DF BOTH FEET. SLR POSITIVE AT 30 DEGREES. FEMORAL STRECTH MANUEVER POSITIVE BILATERALLY.[name]  Assessment/Plan DECOMPRESSIVE LAMINECTOMIES LE TO L5  [name] M 12/29/2011, 7:51 AM

## 2011-12-29 NOTE — Preoperative (Signed)
Beta Blockers   Reason not to administer Beta Blockers:Not Applicable, took atenolol this am

## 2011-12-30 LAB — GLUCOSE, CAPILLARY
Glucose-Capillary: 100 mg/dL — ABNORMAL HIGH (ref 70–99)
Glucose-Capillary: 82 mg/dL (ref 70–99)
Glucose-Capillary: 116 mg/dL — ABNORMAL HIGH (ref 70–99)
Glucose-Capillary: 124 mg/dL — ABNORMAL HIGH (ref 70–99)

## 2011-12-30 NOTE — Evaluation (Signed)
Physical Therapy Evaluation Patient Details Name: [name] MRN: [id number] DOB: 10/31/1951 Today's Date: 12/30/2011  Problem List:  Patient Active Problem List  Diagnoses  . DM  . HYPERLIPIDEMIA  . HYPERTENSION, BENIGN  . CAD  . BRONCHITIS  . Diverticulosis  . Diabetic neuropathy  . Esophageal stricture  . Vitamin D deficiency  . Degenerative joint disease of left shoulder  . Neck pain    Past Medical History:  Past Medical History  Diagnosis Date  . Esophageal stricture   . Hyperlipidemia   . Hypertension   . Bronchitis   . CAD (coronary artery disease)     Stenting in 2005. 2009 cardiac cath. Demonstrated questionable mitral regurgitation. Left main was free of critical disease, the LAD had mild plaquing, circumflex had some mild luminal irregularities, there is ramus intermediate with 40% stenosis prior to stent. The stent itself was widely patent, the RCA had 40% proximal stenosis.  . Arthritis   . Chronic kidney disease     CREATININE IS UP--GOING TO KIDNEY MD   . Non-insulin dependent diabetes mellitus    Past Surgical History:  Past Surgical History  Procedure Date  . Cardiac catheterization 2009  . Bladder surgery     Tack  . Partial hysterectomy   . Shoulder surgery     Right  . Cervical spine surgery   . Back surgery     2007 & 2012  . Abdominal hysterectomy   . Laminectomy 12/29/2011  . Cardiac stents     PT Assessment/Plan/Recommendation PT Assessment Clinical Impression Statement: Pt is s/p lumbar laminectomy. Pt tolerated treatment well today. She was able to transfer and participate with bed mobility with min assistance. She did mention that her doctor wanted her to have a back brace but none have been ordered, PT called doctor to follow up and MD ordered Lumbar aspen corset. Spoke with nurse Anderson Malta who will take care of getting brace. Pt would benefit from PT services for strength and endurance training as well as ambulation and transfers.  Pt has mother and two children that can stay home with her all the time so at this time PT is recommending HHPT for follow up therapy services.  PT Recommendation/Assessment: Patient will need skilled PT in the acute care venue PT Problem List: Decreased strength;Decreased range of motion;Decreased activity tolerance;Decreased balance;Decreased mobility;Decreased coordination;Decreased safety awareness;Decreased knowledge of precautions;Decreased knowledge of use of DME;Pain;Obesity PT Therapy Diagnosis : Difficulty walking;Abnormality of gait;Generalized weakness;Acute pain PT Plan PT Frequency: Min 5X/week PT Treatment/Interventions: DME instruction;Gait training;Stair training;Functional mobility training;Therapeutic activities;Therapeutic exercise;Balance training;Patient/family education;Neuromuscular re-education PT Recommendation Follow Up Recommendations: Home health PT Equipment Recommended: None recommended by PT (pt has RW and bedside commode at home) PT Goals  Acute Rehab PT Goals PT Goal Formulation: With patient Time For Goal Achievement: 2 weeks Pt will go Supine/Side to Sit: Independently;with HOB 0 degrees PT Goal: Supine/Side to Sit - Progress: Goal set today Pt will go Sit to Supine/Side: Independently;with HOB 0 degrees PT Goal: Sit to Supine/Side - Progress: Goal set today Pt will go Sit to Stand: with supervision;with upper extremity assist PT Goal: Sit to Stand - Progress: Goal set today Pt will go Stand to Sit: with supervision;with upper extremity assist PT Goal: Stand to Sit - Progress: Goal set today Pt will Transfer Bed to Chair/Chair to Bed: with supervision PT Transfer Goal: Bed to Chair/Chair to Bed - Progress: Goal set today Pt will Ambulate: 16 - 50 feet;with min assist;with least restrictive assistive  device PT Goal: Ambulate - Progress: Goal set today Pt will Perform Home Exercise Program: Independently PT Goal: Perform Home Exercise Program - Progress:  Goal set today  PT Evaluation Precautions/Restrictions  Precautions Precautions: Back;Fall Precaution Booklet Issued: No Precaution Comments: noted no brace ordered but pt stated MD mentioned possible brace, MD ordered lumbar aspen corset and PT will utilize it next visit  Required Braces or Orthoses: No Prior Functioning  Home Living Lives With: Alone Receives Help From:  (mother and 2 children) Type of Home: Mobile home Home Layout: One level Home Access: Stairs to enter Entrance Stairs-Rails: Can reach both Entrance Stairs-Number of Steps: 2 Bathroom Shower/Tub: Product/process development scientist: Standard Bathroom Accessibility: Yes How Accessible: Accessible via walker Home Adaptive Equipment: Walker - rolling;Bedside commode/3-in-1 (has access to a wheelchair) Prior Function Level of Independence: Needs assistance with homemaking;Independent with basic ADLs;Independent with gait;Independent with transfers Meal Prep: Supervision/set-up Light Housekeeping: Total Driving: Yes Vocation: On disability Cognition Cognition Arousal/Alertness: Awake/alert Overall Cognitive Status: Appears within functional limits for tasks assessed Orientation Level: Oriented X4 Sensation/Coordination Sensation Light Touch: Appears Intact Stereognosis: Not tested Hot/Cold: Not tested Proprioception: Not tested Coordination Gross Motor Movements are Fluid and Coordinated: Yes Fine Motor Movements are Fluid and Coordinated: Yes Extremity Assessment RUE Assessment RUE Assessment: Within Functional Limits LUE Assessment LUE Assessment: Within Functional Limits LUE Tone LUE Tone Comments: pt mentioned that has a tremor in L hand worse then R and has had the tremor since last cervical back surgery  RLE Assessment RLE Assessment: Within Functional Limits LLE Assessment LLE Assessment: Within Functional Limits Mobility (including Balance) Bed Mobility Bed Mobility: Yes Rolling Left:  4: Min assist Rolling Left Details (indicate cue type and reason): needing cueing on progression, educated on log rolling to not twist body  Left Sidelying to Sit: 4: Min assist;HOB elevated (comment degrees) Left Sidelying to Sit Details (indicate cue type and reason): needing cueing on progression, educating on sitting up and not bending; HOB = 25 degrees  Sitting - Scoot to Edge of Bed: 5: Supervision Transfers Transfers: Yes Sit to Stand: 4: Min assist;From bed;From elevated surface Sit to Stand Details (indicate cue type and reason): pt needing cueing on hand placement and progression of exercise Stand to Sit: 5: Supervision;To chair/3-in-1 Stand to Sit Details: pt needing cueing on hand placement  Stand Pivot Transfers: 4: Min assist Stand Pivot Transfer Details (indicate cue type and reason): needing cueing on progression of exercise  Ambulation/Gait Ambulation/Gait: No Stairs: No Wheelchair Mobility Wheelchair Mobility: No  Posture/Postural Control Posture/Postural Control: No significant limitations Balance Balance Assessed: Yes Static Sitting Balance Static Sitting - Balance Support: Bilateral upper extremity supported;Feet unsupported Static Sitting - Level of Assistance: 5: Stand by assistance Static Standing Balance Static Standing - Balance Support: Bilateral upper extremity supported Static Standing - Level of Assistance: 5: Stand by assistance End of Session PT - End of Session Equipment Utilized During Treatment: Gait belt Activity Tolerance: Patient tolerated treatment well Patient left: in chair;with call bell in reach Nurse Communication: Mobility status for transfers General Behavior During Session: [facility] for tasks performed Cognition: [facility] for tasks performed  Dulce Sellar 12/30/2011, 2:42 PM

## 2011-12-30 NOTE — Progress Notes (Signed)
Patient w/ c/o tremors in bilateral hands this morning; per off going RN started @ 0630 with tongue numbness. Tongue numbness now resolved. Pt. Generally weak with shaking in hands when moving them around. Dr. Arnoldo Morale on unit and made aware; will see patient. CBG: 100

## 2011-12-30 NOTE — Progress Notes (Signed)
OT Cancellation Note  Treatment cancelled today due to patient's refusal to participate due to just returning to bed. Will re-attempt as time allows.  Quaron Delacruz, OTR/L Pager [phone] 12/30/2011, 4:10 PM

## 2011-12-30 NOTE — Evaluation (Signed)
[facility] [phone] (pager)

## 2011-12-30 NOTE — Progress Notes (Signed)
Patient still has not voided. Very drowsy; with no narcotics since 0630 am. Patient c/o a lot of pain when aroused but falling asleep during conversation w/ this RN. VSS. Foley placed per MD order secondary to urine retention. Pt. fell asleep during catheterization and was re-aroused by this RN, per pt. "I didn't even feel you put it in." + sensation in BLE when pt. is awake. Foley draining clear, yellow urine. Explained to pt. that since she is having so much difficulty staying awake will hold off on any narcotic meds. Will also hold noon dose of Amaryl due to poor po intake secondary to drowsiness. CBG currently 82. Will continue to monitor.

## 2011-12-30 NOTE — Progress Notes (Signed)
Patient ID: [name], female   DOB: 01-17-1951, 61 y.o.   MRN: [id number] Subjective:  The patient is alert and pleasant. She has been having urinary retention. She has prior history of bladder problems. Her back is sore.  Objective: Vital signs in last 24 hours: Temp:  [97.6 F (36.4 C)-98.7 F (37.1 C)] 98.7 F (37.1 C) (01/26 0604) Pulse Rate:  [57-80] 76  (01/26 0604) Resp:  [14-20] 18  (01/26 0604) BP: (109-170)/(67-95) 109/68 mmHg (01/26 0604) SpO2:  [90 %-99 %] 95 % (01/26 0604) Weight:  [98.884 kg (218 lb)] 98.884 kg (218 lb) (01/25 1305)  Intake/Output from previous day: 01/25 0701 - 01/26 0700 In: 1740 [P.O.:240; I.V.:1500] Out: 1530 [Urine:850; Drains:430; Blood:250] Intake/Output this shift:    Physical exam the patient is alert and oriented. She is moving all 4 extremities well. Her dressing is clean and dry.  Lab Results: No results found for this basename: WBC:2,HGB:2,HCT:2,PLT:2 in the last 72 hours BMET No results found for this basename: NA:2,K:2,CL:2,CO2:2,GLUCOSE:2,BUN:2,CREATININE:2,CALCIUM:2 in the last 72 hours  Studies/Results: Dg Lumbar Spine 2-3 Views  12/29/2011  *RADIOLOGY REPORT*  Clinical Data: Laminectomy L2-L5.  LUMBAR SPINE - 2-3 VIEW  Comparison: MRI 05/29/2011  Findings: First lateral intraoperative image demonstrates posterior surgical instruments along the spinous process of L2 and L4.  Second lateral intraoperative image demonstrates posterior needles directed at the L2 vertebral body and L5-S1 disc space.  IMPRESSION: Intraoperative localization as above.  Original Report Authenticated By: Raelyn Number, M.D.    Assessment/Plan: Postop day 1: We will mobilize the patient with PT and OT. We may need to insert a Foley.  LOS: 1 day     [name] D 12/30/2011, 8:45 AM

## 2011-12-31 LAB — GLUCOSE, CAPILLARY
Glucose-Capillary: 114 mg/dL — ABNORMAL HIGH (ref 70–99)
Glucose-Capillary: 144 mg/dL — ABNORMAL HIGH (ref 70–99)
Glucose-Capillary: 151 mg/dL — ABNORMAL HIGH (ref 70–99)
Glucose-Capillary: 158 mg/dL — ABNORMAL HIGH (ref 70–99)

## 2011-12-31 NOTE — Evaluation (Signed)
Occupational Therapy Evaluation Patient Details Name: ROCHELL EWBANK MRN: [id number] DOB: [birth year] Today's Date: 12/31/2011 Time in: 13:47  Time out: 14:13 Eval II  Problem List:  Patient Active Problem List  Diagnoses  . DM  . HYPERLIPIDEMIA  . HYPERTENSION, BENIGN  . CAD  . BRONCHITIS  . Diverticulosis  . Diabetic neuropathy  . Esophageal stricture  . Vitamin D deficiency  . Degenerative joint disease of left shoulder  . Neck pain    Past Medical History:  Past Medical History  Diagnosis Date  . Esophageal stricture   . Hyperlipidemia   . Hypertension   . Bronchitis   . CAD (coronary artery disease)     Stenting in 2005. 2009 cardiac cath. Demonstrated questionable mitral regurgitation. Left main was free of critical disease, the LAD had mild plaquing, circumflex had some mild luminal irregularities, there is ramus intermediate with 40% stenosis prior to stent. The stent itself was widely patent, the RCA had 40% proximal stenosis.  . Arthritis   . Chronic kidney disease     CREATININE IS UP--GOING TO KIDNEY MD   . Non-insulin dependent diabetes mellitus    Past Surgical History:  Past Surgical History  Procedure Date  . Cardiac catheterization 2009  . Bladder surgery     Tack  . Partial hysterectomy   . Shoulder surgery     Right  . Cervical spine surgery   . Back surgery     2007 & 2012  . Abdominal hysterectomy   . Laminectomy 12/29/2011  . Cardiac stents     OT Assessment/Plan/Recommendation OT Assessment Clinical Impression Statement: Pt will benefit from skilled OT services to increase her independence with ADL for discharge home.  OT Recommendation/Assessment: Patient will need skilled OT in the acute care venue OT Problem List: Decreased strength;Decreased knowledge of use of DME or AE;Pain;Decreased knowledge of precautions;Decreased activity tolerance OT Therapy Diagnosis : Generalized weakness;Acute pain OT Plan OT Frequency: Min 2X/week OT  Treatment/Interventions: Self-care/ADL training;Therapeutic activities;DME and/or AE instruction;Patient/family education OT Recommendation Follow Up Recommendations: Home health OT;Other (comment) (pt concerned about Occupational psychologist, please discuss cost of [employer] with patient further. ) Equipment Recommended: Tub/shower bench;Other (comment) (will further assess for tub DME. Pt concerned about cost. Case manager please discuss cost of tub bench if needed for discharge.) Individuals Consulted Consulted and Agree with Results and Recommendations: Patient OT Goals Acute Rehab OT Goals OT Goal Formulation: With patient Time For Goal Achievement: 2 weeks ADL Goals Pt Will Perform Grooming: with supervision;Standing at sink ADL Goal: Grooming - Progress: Goal set today Pt Will Perform Upper Body Bathing: with set-up;Sitting, chair;Sitting, edge of bed ADL Goal: Upper Body Bathing - Progress: Goal set today Pt Will Perform Lower Body Bathing: with supervision;Sit to stand from chair;Sit to stand from bed;with adaptive equipment ADL Goal: Lower Body Bathing - Progress: Goal set today Pt Will Perform Upper Body Dressing: with supervision;Sitting, bed;Sitting, chair;Other (comment) (including donning brace EOB/EOC) ADL Goal: Upper Body Dressing - Progress: Goal set today Pt Will Perform Lower Body Dressing: Sit to stand from bed;Sit to stand from chair;with adaptive equipment;with supervision ADL Goal: Lower Body Dressing - Progress: Goal set today Pt Will Transfer to Toilet: with supervision;Ambulation;with DME;3-in-1 ADL Goal: Toilet Transfer - Progress: Goal set today Pt Will Perform Toileting - Clothing Manipulation: with supervision;Standing ADL Goal: Toileting - Clothing Manipulation - Progress: Goal set today Pt Will Perform Toileting - Hygiene: with supervision;with adaptive equipment;Sit to stand from 3-in-1/toilet ADL Goal: Toileting - Hygiene -  Progress: Goal set today Pt Will Perform  Tub/Shower Transfer: Tub transfer;with min assist;with DME;Transfer tub bench ADL Goal: Tub/Shower Transfer - Progress: Goal set today  OT Evaluation Precautions/Restrictions  Precautions Precautions: Back Precaution Booklet Issued: No Precaution Comments: Pt unable to verbalize any of the precautions.  Re-educated all 3 back precautions before & after session.   Back brace in room.  Donned in sitting.   Required Braces or Orthoses: Yes Spinal Brace: Lumbar corset;Applied in sitting position Prior [place] With: Alone Receives Help From:  (mother and 2 children) Type of Home: Mobile home Home Layout: One level Home Access: Stairs to enter Entrance Stairs-Rails: Can reach both Entrance Stairs-Number of Steps: 2 Bathroom Shower/Tub: Product/process development scientist: Standard Bathroom Accessibility: Yes How Accessible: Accessible via walker [place]: Walker - rolling;Bedside commode/3-in-1 (has access to a wheelchair) Prior Function Level of Independence: Needs assistance with homemaking;Independent with basic ADLs;Independent with gait;Independent with transfers Meal Prep: Supervision/set-up Light Housekeeping: Total Driving: Yes Vocation: On disability ADL ADL Eating/Feeding: Simulated;Independent Where Assessed - Eating/Feeding: Chair Grooming: Simulated;Set up Where Assessed - Grooming: Sitting, chair Upper Body Bathing: Minimal assistance;Simulated;Chest;Right arm;Left arm;Abdomen Upper Body Bathing Details (indicate cue type and reason): pt states left UE is very sore/painful at shoulder and difficult to raise arm. Also note UE is shaky when she tries to lift it. Where Assessed - Upper Body Bathing: Sitting, chair Lower Body Bathing: Simulated;Maximal assistance Lower Body Bathing Details (indicate cue type and reason): without adaptive equipment Where Assessed - Lower Body Bathing: Sit to stand from chair Upper Body Dressing:  Simulated;+1 Total assistance Upper Body Dressing Details (indicate cue type and reason): with brace. pain level 8-10 during eval. Where Assessed - Upper Body Dressing: Sitting, chair Lower Body Dressing: Simulated;+1 Total assistance Lower Body Dressing Details (indicate cue type and reason): note LEs are shaky in standing and with transfers. Pt states legs feel very weak.  Where Assessed - Lower Body Dressing: Sit to stand from chair Toilet Transfer: Performed;Minimal assistance Toilet Transfer Details (indicate cue type and reason): min verbal cues for precautions. Toilet Transfer Method: Counselling psychologist: Raised toilet seat with arms (or 3-in-1 over toilet) Toileting - Clothing Manipulation: Simulated;Moderate assistance Where Assessed - Toileting Clothing Manipulation: Standing Toileting - Hygiene: Performed;+1 Total assistance Toileting - Hygiene Details (indicate cue type and reason): unable to perform front periarea hygeine without bending forward. Where Assessed - Toileting Hygiene: Sit on 3-in-1 or toilet Tub/Shower Transfer: Not assessed Tub/Shower Transfer Method: Not assessed Equipment Used: Rolling walker ADL Comments: Began discussion of AE options and patient states she would like to be as independent as possible. Also demonstarted toilet aid as patient will likely need this as she cant reach periareas without breaking precautions currently. Pt with alot of pain this visit. Pt able to state 2/3 back precautions. Reviewed all with patient Vision/Perception  Vision - History Baseline Vision: No visual deficits Cognition Cognition Arousal/Alertness: Awake/alert Overall Cognitive Status: Appears within functional limits for tasks assessed Orientation Level: Oriented X4 Sensation/Coordination Sensation Light Touch: Appears Intact Extremity Assessment RUE Assessment RUE Assessment: Within Functional Limits LUE Assessment LUE Assessment: Exceptions to  WFL LUE Tone LUE Tone Comments: pt reports a tremor in left hand and has increased pain with shoulder flexion. Note tremor when patient tries to raise arm to chest level. Pt states nsg aware. Mobility  Bed Mobility Rolling Left: With rail;4: Min assist Rolling Left Details (indicate cue type and reason): (A) to initiate rolling.  Cues for sequencing, technique, reinforcement of back precautions.   Left Sidelying to Sit: 4: Min assist;With rails Left Sidelying to Sit Details (indicate cue type and reason): (A) to advance LE's off EOB, to lift shoulders/trunk to sitting position.  Cues for technique, sequencing, use of UE's, reinforcement of back precautions.   Sitting - Scoot to Edge of Bed: 5: Supervision Sitting - Scoot to [employer] of Bed Details (indicate cue type and reason): Cues for no bending when scooting hips closer to EOB>   Transfers Sit to Stand: 4: Min assist;With upper extremity assist;From chair/3-in-1 Sit to Stand Details (indicate cue type and reason): min verbal cues Stand to Sit: 4: Min assist;With upper extremity assist;To chair/3-in-1 Stand to Sit Details: min verbal cues Exercises   End of Session OT - End of Session Equipment Utilized During Treatment: Other (comment) (RW and 3in1) Activity Tolerance: Patient limited by fatigue;Patient limited by pain Patient left: in chair;with call bell in reach General Behavior During Session: [facility] for tasks performed Cognition: [facility] for tasks performed   Jules Schick [id number] 12/31/2011, 3:10 PM

## 2011-12-31 NOTE — Progress Notes (Signed)
Patient ID: [name], female   DOB: [birth year], 61 y.o.   MRN: [id number] Subjective: Patient reports Left arm feels weak, and lots of pain!  Objective: Vital signs in last 24 hours: Temp:  [97.9 F (36.6 C)-101.1 F (38.4 C)] 98.5 F (36.9 C) (01/27 0600) Pulse Rate:  [73-82] 75  (01/27 0600) Resp:  [18] 18  (01/27 0600) BP: (97-115)/(56-71) 97/60 mmHg (01/27 0600) SpO2:  [92 %-99 %] 92 % (01/27 0600)  Intake/Output from previous day: 01/26 0701 - 01/27 0700 In: 683 [P.O.:600; I.V.:3] Out: 1425 [Urine:1350; Drains:75] Intake/Output this shift:    Neurologic: Grossly normal, except has some weakness of intrinsic muscles on Left hand. Otherwise upper extremity strength OK.  Lab Results: No results found for this basename: WBC:2,HGB:2,HCT:2,PLT:2 in the last 72 hours BMET No results found for this basename: NA:2,K:2,CL:2,CO2:2,GLUCOSE:2,BUN:2,CREATININE:2,CALCIUM:2 in the last 72 hours  Studies/Results: No results found.  Assessment/Plan: Stable. Will defer further work up to Dr Joya Salm.  LOS: 2 days  As above   Faythe Ghee, MD 12/31/2011, 10:31 AM

## 2011-12-31 NOTE — Progress Notes (Signed)
Physical Therapy Treatment Patient Details Name: [name] MRN: [id number] DOB: 03/14/1951 Today's Date: 12/31/2011  PT Assessment/Plan  PT - Assessment/Plan Comments on Treatment Session: Pt progressing with PT goals.  Corsett brace in room.  Applied in sitting position.  Pt requires increased time with all mobility due to pain.   PT Frequency: Min 5X/week Follow Up Recommendations: Home health PT Equipment Recommended: None recommended by PT PT Goals  Acute Rehab PT Goals PT Goal: Supine/Side to Sit - Progress: Progressing toward goal PT Goal: Sit to Stand - Progress: Progressing toward goal PT Goal: Stand to Sit - Progress: Progressing toward goal PT Goal: Ambulate - Progress: Progressing toward goal  PT Treatment Precautions/Restrictions  Precautions Precautions: Back Precaution Booklet Issued: No Precaution Comments: Pt unable to verbalize any of the precautions.  Re-educated all 3 back precautions before & after session.   Back brace in room.  Donned in sitting.   Required Braces or Orthoses: Yes Spinal Brace: Lumbar corset;Applied in sitting position Mobility (including Balance) Bed Mobility Rolling Left: With rail;4: Min assist Rolling Left Details (indicate cue type and reason): (A) to initiate rolling.  Cues for sequencing, technique, reinforcement of back precautions.   Left Sidelying to Sit: 4: Min assist;With rails Left Sidelying to Sit Details (indicate cue type and reason): (A) to advance LE's off EOB, to lift shoulders/trunk to sitting position.  Cues for technique, sequencing, use of UE's, reinforcement of back precautions.   Sitting - Scoot to Edge of Bed: 5: Supervision Sitting - Scoot to [employer] of Bed Details (indicate cue type and reason): Cues for no bending when scooting hips closer to EOB>   Transfers Sit to Stand: 4: Min assist;From bed;With upper extremity assist Sit to Stand Details (indicate cue type and reason): Cues for hand placement & no bending.   (A) to achieve standing, balance, & safety.   Stand to Sit: 5: Supervision;To chair/3-in-1;With armrests;With upper extremity assist Stand to Sit Details: Cues for hand placement.   Ambulation/Gait Ambulation/Gait: Yes Ambulation/Gait Assistance: Other (comment) (Min Guard (A)) Ambulation/Gait Assistance Details (indicate cue type and reason): Cues to stay inside RW, upright posture, relax shoulders.   Ambulation Distance (Feet): 40 Feet Assistive device: Rolling walker Gait Pattern: Decreased step length - right;Decreased step length - left;Decreased stride length;Shuffle Stairs: No Wheelchair Mobility Wheelchair Mobility: No    Exercise    End of Session PT - End of Session Equipment Utilized During Treatment: Gait belt;Back brace Activity Tolerance: Patient tolerated treatment well Patient left: in chair;with call bell in reach General Behavior During Session: [facility] for tasks performed Cognition: [facility] for tasks performed  Sena Hitch 12/31/2011, 1:03 PM [phone]

## 2012-01-01 ENCOUNTER — Inpatient Hospital Stay (HOSPITAL_COMMUNITY): Payer: BC Managed Care – PPO

## 2012-01-01 DIAGNOSIS — M47817 Spondylosis without myelopathy or radiculopathy, lumbosacral region: Secondary | ICD-10-CM

## 2012-01-01 DIAGNOSIS — IMO0002 Reserved for concepts with insufficient information to code with codable children: Secondary | ICD-10-CM

## 2012-01-01 LAB — GLUCOSE, CAPILLARY
Glucose-Capillary: 56 mg/dL — ABNORMAL LOW (ref 70–99)
Glucose-Capillary: 59 mg/dL — ABNORMAL LOW (ref 70–99)
Glucose-Capillary: 143 mg/dL — ABNORMAL HIGH (ref 70–99)
Glucose-Capillary: 105 mg/dL — ABNORMAL HIGH (ref 70–99)
Glucose-Capillary: 127 mg/dL — ABNORMAL HIGH (ref 70–99)

## 2012-01-01 NOTE — Progress Notes (Addendum)
Progression of mobility limited by pain. Feel patient can go home with home health once pain decreased. I will follow up tomorrow on her progress. Please call me with questions. Pager [phone] Patient says that insurance did not pay for home health last time. She wants this verified before any ordered. She says her Mom and kids can assist her after d/c

## 2012-01-01 NOTE — Progress Notes (Signed)
Occupational Therapy Treatment Patient Details Name: [name] MRN: [id number] DOB: 02-28-1951 Today's Date: 01/01/2012  OT Assessment/Plan OT Assessment/Plan Comments on Treatment Session: Pt continues to c/o of L shoulder pain and incoordination.  X-ray pending. OT Plan: Discharge plan remains appropriate OT Frequency: Min 2X/week Equipment Recommended: Other (comment) (pt declining tub bench, to purchase on her own) OT Goals Acute Rehab OT Goals OT Goal Formulation: With patient Time For Goal Achievement: 2 weeks ADL Goals Pt Will Perform Upper Body Dressing: with supervision;Sitting, bed;Sitting, chair;Other (comment) ADL Goal: Upper Body Dressing - Progress: Partly met Pt Will Transfer to Toilet: with supervision;Ambulation;with DME;3-in-1 ADL Goal: Toilet Transfer - Progress: Progressing toward goals  OT Treatment Precautions/Restrictions  Precautions Precautions: Back Precaution Comments: Pt verbalized 3/3 back precautions. Required Braces or Orthoses: Yes Spinal Brace: Lumbar corset   ADL ADL Upper Body Dressing: Performed Upper Body Dressing Details (indicate cue type and reason): doffed front opening gown, back brace Where Assessed - Upper Body Dressing: Sitting, bed Toilet Transfer: Simulated;Minimal assistance Toilet Transfer Details (indicate cue type and reason): chair to bed Toilet Transfer Method: Ambulating Equipment Used: Rolling walker;Long-handled shoe horn;Long-handled sponge;Reacher;Sock aid ADL Comments: Pt has reacher, sock aide, long shoe horn and long bath sponge from back sx in 2007. Could benefit from practicing use. Discussed with pt the cost of tub transfer bench as insurance does not cover.  Pt will purchase on her own.  Pt shown toilet tongs, advised pt in alternatives to the expense of tongs specific for toileting. Mobility  Bed Mobility Bed Mobility: Yes (supervision sit to supine) Transfers Transfers: Yes Sit to Stand: 4: Min  assist;From chair/3-in-1;With armrests Sit to Stand Details (indicate cue type and reason): Cues for hand placement & decrease trunk flexion Stand to Sit: 5: Supervision;To bed;With upper extremity assist   End of Session OT - End of Session Equipment Utilized During Treatment: Back brace Activity Tolerance: Patient limited by fatigue;Patient limited by pain Patient left: in bed;with call bell in reach General Behavior During Session: [facility] for tasks performed Cognition: [facility] for tasks performed  Malka So  01/01/2012, 3:02 PM [phone]

## 2012-01-01 NOTE — Progress Notes (Signed)
CARE MANAGEMENT NOTE 01/01/2012  Case Manager spoke with patient. She states she doesnt want Home Health therapy as it isnt completely covered by [employer]. States she had therapy with previous surgery and has an Mudlogger, doesnt want to create another. States her family will help her as needed, she will borrow a shower seat. Informed charge nurse of this conversation.

## 2012-01-01 NOTE — Progress Notes (Signed)
Patient ID: [name], female   DOB: 01-12-1951, 61 y.o.   MRN: [id number] C/o incisional pain and new onset of left shoulder pain. Tenderness at the acromioclavicular area . No weakness.seen by rehabilitation.

## 2012-01-01 NOTE — Progress Notes (Signed)
OT Note:  Pt in 9/10 pain.  RN made aware and meds given.  Pt politely requesting OT defer until pain in managed. 01/01/2012 Nestor Lewandowsky, OTR/L Pager: [phone]

## 2012-01-01 NOTE — Progress Notes (Signed)
Glycemic Control Recommendations     Hypoglycemic event:  Consider discontinuing oral agents while in hospital or reduce dose.

## 2012-01-01 NOTE — Consult Note (Signed)
Physical Medicine and Rehabilitation Consult Reason for Consult: Lumbar radiculopathy Referring Phsyician: Dr. Carolan Shiver is an 61 y.o. female.   HPI: 61 year old right-handed black female with multiple back surgeries in the past. Admitted January 25 with low back pain radiating to bilateral lower extremities. X-rays and imaging showed lumbar stenosis with radiculopathy lumbar 2-3-4-5. Underwent bilateral lumbar 2, 3, 4, 5 laminectomy foraminotomies decompression on January 25 per Dr. Joya Salm. She is fitted with a back corset. Pain management with Percocet as well as Flexeril. Latest physical therapy notes ambulating 40 feet rolling walker and minimal assistance. She required minimal assist for sit to stand. Occupational therapy notes minimal assistance for upper body bathing and max assist for lower body bathing and +1 total assist upper body dressing. Physical medicine and rehabilitation was consulted for consideration of inpatient rehabilitation services versus home health Review of Systems  Constitutional: Negative for chills.  HENT: Negative for hearing loss.   Eyes: Negative for double vision.  Respiratory: Negative for cough and shortness of breath.   Cardiovascular: Negative for chest pain.  Gastrointestinal: Positive for constipation. Negative for nausea.  Genitourinary: Negative for dysuria.  Musculoskeletal: Positive for myalgias and back pain.  Skin: Negative.   Neurological: Negative for dizziness.  Psychiatric/Behavioral: Negative.    Past Medical History  Diagnosis Date  . Esophageal stricture   . Hyperlipidemia   . Hypertension   . Bronchitis   . CAD (coronary artery disease)     Stenting in 2005. 2009 cardiac cath. Demonstrated questionable mitral regurgitation. Left main was free of critical disease, the LAD had mild plaquing, circumflex had some mild luminal irregularities, there is ramus intermediate with 40% stenosis prior to stent. The stent itself was  widely patent, the RCA had 40% proximal stenosis.  . Arthritis   . Chronic kidney disease     CREATININE IS UP--GOING TO KIDNEY MD   . Non-insulin dependent diabetes mellitus    Past Surgical History  Procedure Date  . Cardiac catheterization 2009  . Bladder surgery     Tack  . Partial hysterectomy   . Shoulder surgery     Right  . Cervical spine surgery   . Back surgery     2007 & 2012  . Abdominal hysterectomy   . Laminectomy 12/29/2011  . Cardiac stents    Family History  Problem Relation Age of Onset  . Heart disease Other    Social History:  reports that she quit smoking about 10 years ago. She has never used smokeless tobacco. She reports that she does not drink alcohol or use illicit drugs. Allergies:  Allergies  Allergen Reactions  . Ace Inhibitors   . Clopidogrel Bisulfate     REACTION: Sick, Headache, "Felt terrible"  . Codeine     REACTION: hallucinations  . Penicillins    Medications Prior to Admission  Medication Dose Route Frequency Provider Last Rate Last Dose  . 0.9 %  sodium chloride infusion  250 mL Intravenous Continuous Floyce Stakes, MD      . 0.9 %  sodium chloride infusion   Intravenous Continuous Floyce Stakes, MD 75 mL/hr at 12/29/11 1141    . acetaminophen (TYLENOL) tablet 650 mg  650 mg Oral Q4H PRN Floyce Stakes, MD   650 mg at 12/31/11 [id number]   Or  . acetaminophen (TYLENOL) suppository 650 mg  650 mg Rectal Q4H PRN Floyce Stakes, MD      . atenolol (TENORMIN) tablet 50 mg  50  mg Oral BID Floyce Stakes, MD   50 mg at 12/31/11 2128  . bupivacaine liposome (EXPAREL) 1.3 % injection 266 mg  20 mL Infiltration To OR Floyce Stakes, MD      . cyclobenzaprine (FLEXERIL) tablet 10 mg  10 mg Oral TID PRN Floyce Stakes, MD   10 mg at 12/31/11 0923  . docusate sodium (COLACE) capsule 100 mg  100 mg Oral BID Floyce Stakes, MD   100 mg at 12/31/11 2128  . gabapentin (NEURONTIN) capsule 300 mg  300 mg Oral TID Floyce Stakes, MD    300 mg at 12/31/11 2128  . glimepiride (AMARYL) tablet 4 mg  4 mg Oral BID AC Floyce Stakes, MD   4 mg at 12/31/11 1726  . HYDROmorphone (DILAUDID) 1 MG/ML injection           . HYDROmorphone (DILAUDID) 1 MG/ML injection           . insulin detemir (LEVEMIR) injection 27 Units  27 Units Subcutaneous QHS Floyce Stakes, MD   27 Units at 12/31/11 2129  . menthol-cetylpyridinium (CEPACOL) lozenge 3 mg  1 lozenge Oral PRN Floyce Stakes, MD       Or  . phenol (CHLORASEPTIC) mouth spray 1 spray  1 spray Mouth/Throat PRN Floyce Stakes, MD      . morphine 4 MG/ML injection 4 mg  4 mg Intravenous Q4H PRN Floyce Stakes, MD   4 mg at 12/31/11 [id number]  . NIFEdipine (PROCARDIA XL/ADALAT-CC) 24 hr tablet 90 mg  90 mg Oral Daily Floyce Stakes, MD   90 mg at 12/31/11 1041  . ondansetron (ZOFRAN) injection 4 mg  4 mg Intravenous Q4H PRN Floyce Stakes, MD      . oxyCODONE-acetaminophen [facility]) 5-325 MG per tablet 1-2 tablet  1-2 tablet Oral Q4H PRN Floyce Stakes, MD   2 tablet at 12/31/11 2007  . pioglitazone (ACTOS) tablet 30 mg  30 mg Oral Daily Floyce Stakes, MD   30 mg at 12/31/11 1041  . rosuvastatin (CRESTOR) tablet 20 mg  20 mg Oral q1800 Floyce Stakes, MD   20 mg at 12/31/11 1726  . sodium chloride 0.9 % injection 3 mL  3 mL Intravenous Q12H Floyce Stakes, MD   3 mL at 12/31/11 2129  . sodium chloride 0.9 % injection 3 mL  3 mL Intravenous PRN Floyce Stakes, MD      . sorbitol 70 % solution 30 mL  30 mL Oral Daily PRN Floyce Stakes, MD      . vancomycin (VANCOCIN) IVPB 1000 mg/200 mL premix  1,000 mg Intravenous 120 min pre-op Floyce Stakes, MD   1,000 mg at 12/29/11 0809  . vancomycin (VANCOCIN) IVPB 1000 mg/200 mL premix  1,000 mg Intravenous Once Floyce Stakes, MD   1,000 mg at 12/29/11 1704  . zolpidem (AMBIEN) tablet 5 mg  5 mg Oral QHS PRN Floyce Stakes, MD   5 mg at 12/29/11 2301  . DISCONTD: 0.9 % irrigation (POUR BTL)    PRN Floyce Stakes, MD    1,000 mL at 12/29/11 0852  . DISCONTD: bupivacaine liposome (EXPAREL) 1.3 % injection    PRN Floyce Stakes, MD   20 mL at 12/29/11 1009  . DISCONTD: ceFAZolin (ANCEF) IVPB 1 g/50 mL premix  1 g Intravenous Q8H Floyce Stakes, MD      . DISCONTD: hemostatic agents  PRN Floyce Stakes, MD   1 application at [id number] [phone]  . DISCONTD: HYDROmorphone (DILAUDID) injection 0.25-0.5 mg  0.25-0.5 mg Intravenous Q5 min PRN Pearletha Furl, MD   0.5 mg at 12/29/11 1115  . DISCONTD: insulin detemir (LEVEMIR) injection 27 Units  27 Units Subcutaneous Daily Faythe Ghee, MD      . DISCONTD: ketorolac (TORADOL) 30 MG/ML injection 15-30 mg  15-30 mg Intravenous Once PRN Pearletha Furl, MD      . DISCONTD: meperidine (DEMEROL) injection 6.25-12.5 mg  6.25-12.5 mg Intravenous Q5 min PRN Pearletha Furl, MD      . DISCONTD: promethazine (PHENERGAN) injection 6.25-12.5 mg  6.25-12.5 mg Intravenous Q15 min PRN Pearletha Furl, MD      . DISCONTD: thrombin spray    PRN Floyce Stakes, MD   5,000 Units at 12/29/11 [phone]  . DISCONTD: thrombin spray    PRN Floyce Stakes, MD   5,000 Units at 12/29/11 [id number]   Medications Prior to Admission  Medication Sig Dispense Refill  . aspirin 81 MG EC tablet Take 81 mg by mouth daily.        [name] atenolol (TENORMIN) 50 MG tablet Take 50 mg by mouth 2 (two) times daily.        [name] gabapentin (NEURONTIN) 300 MG capsule Take 300 mg by mouth 3 (three) times daily.       [name] glimepiride (AMARYL) 4 MG tablet Take 4 mg by mouth 2 (two) times daily.       . insulin detemir (LEVEMIR) 100 UNIT/ML injection Inject 27 Units into the skin daily.       . insulin lispro (HUMALOG) 100 UNIT/ML injection Inject 6 Units into the skin. 6 units for blood sugar readings greater than 300.       . pioglitazone (ACTOS) 30 MG tablet Take 30 mg by mouth daily.        . rosuvastatin (CRESTOR) 20 MG tablet Take 20 mg by mouth daily.        . valsartan-hydrochlorothiazide  (DIOVAN-HCT) 320-25 MG per tablet Take 1 tablet by mouth daily.          Home: Home Living Lives With: Alone Receives Help From:  (mother and 2 children) Type of Home: Mobile home Home Layout: One level Home Access: Stairs to enter Entrance Stairs-Rails: Can reach both Entrance Stairs-Number of Steps: 2 Bathroom Shower/Tub: Product/process development scientist: Standard Bathroom Accessibility: Yes How Accessible: Accessible via walker Home Adaptive Equipment: [place];Bedside commode/3-in-1 (has access to a wheelchair)  Functional History: Prior Function Level of Independence: Needs assistance with homemaking;Independent with basic ADLs;Independent with gait;Independent with transfers Meal Prep: Supervision/set-up Light Housekeeping: Total Driving: Yes Vocation: On disability Functional Status:  Mobility: Bed Mobility Bed Mobility: Yes Rolling Left: With rail;4: Min assist Rolling Left Details (indicate cue type and reason): (A) to initiate rolling.  Cues for sequencing, technique, reinforcement of back precautions.   Left Sidelying to Sit: 4: Min assist;With rails Left Sidelying to Sit Details (indicate cue type and reason): (A) to advance LE's off EOB, to lift shoulders/trunk to sitting position.  Cues for technique, sequencing, use of UE's, reinforcement of back precautions.   Sitting - Scoot to Edge of Bed: 5: Supervision Sitting - Scoot to [employer] of Bed Details (indicate cue type and reason): Cues for no bending when scooting hips closer to EOB>   Transfers Transfers: Yes Sit to Stand: 4: Min assist;With upper extremity assist;From chair/3-in-1 Sit to  Stand Details (indicate cue type and reason): min verbal cues Stand to Sit: 4: Min assist;With upper extremity assist;To chair/3-in-1 Stand to Sit Details: min verbal cues Stand Pivot Transfers: 4: Min assist Stand Pivot Transfer Details (indicate cue type and reason): needing cueing on progression of exercise    Ambulation/Gait Ambulation/Gait: Yes Ambulation/Gait Assistance: Other (comment) (Min Guard (A)) Ambulation/Gait Assistance Details (indicate cue type and reason): Cues to stay inside RW, upright posture, relax shoulders.   Ambulation Distance (Feet): 40 Feet Assistive device: Rolling walker Gait Pattern: Decreased step length - right;Decreased step length - left;Decreased stride length;Shuffle Stairs: No Wheelchair Mobility Wheelchair Mobility: No  ADL: ADL Eating/Feeding: Simulated;Independent Where Assessed - Eating/Feeding: Chair Grooming: Simulated;Set up Where Assessed - Grooming: Sitting, chair Upper Body Bathing: Minimal assistance;Simulated;Chest;Right arm;Left arm;Abdomen Upper Body Bathing Details (indicate cue type and reason): pt states left UE is very sore/painful at shoulder and difficult to raise arm. Also note UE is shaky when she tries to lift it. Where Assessed - Upper Body Bathing: Sitting, chair Lower Body Bathing: Simulated;Maximal assistance Lower Body Bathing Details (indicate cue type and reason): without adaptive equipment Where Assessed - Lower Body Bathing: Sit to stand from chair Upper Body Dressing: Simulated;+1 Total assistance Upper Body Dressing Details (indicate cue type and reason): with brace. pain level 8-10 during eval. Where Assessed - Upper Body Dressing: Sitting, chair Lower Body Dressing: Simulated;+1 Total assistance Lower Body Dressing Details (indicate cue type and reason): note LEs are shaky in standing and with transfers. Pt states legs feel very weak.  Where Assessed - Lower Body Dressing: Sit to stand from chair Toilet Transfer: Performed;Minimal assistance Toilet Transfer Details (indicate cue type and reason): min verbal cues for precautions. Toilet Transfer Method: Counselling psychologist: Raised toilet seat with arms (or 3-in-1 over toilet) Toileting - Clothing Manipulation: Simulated;Moderate assistance Where  Assessed - Toileting Clothing Manipulation: Standing Toileting - Hygiene: Performed;+1 Total assistance Toileting - Hygiene Details (indicate cue type and reason): unable to perform front periarea hygeine without bending forward. Where Assessed - Toileting Hygiene: Sit on 3-in-1 or toilet Tub/Shower Transfer: Not assessed Tub/Shower Transfer Method: Not assessed Equipment Used: Rolling walker ADL Comments: Began discussion of AE options and patient states she would like to be as independent as possible. Also demonstarted toilet aid as patient will likely need this as she cant reach periareas without breaking precautions currently. Pt with alot of pain this visit. Pt able to state 2/3 back precautions. Reviewed all with patient  Cognition: Cognition Arousal/Alertness: Awake/alert Orientation Level: Oriented X4 Cognition Arousal/Alertness: Awake/alert Overall Cognitive Status: Appears within functional limits for tasks assessed Orientation Level: Oriented X4  Blood pressure 116/62, pulse 75, temperature 98.8 F (37.1 C), temperature source Oral, resp. rate 20, height 5\' 2"  (1.575 m), weight 98.884 kg (218 lb), SpO2 92.00%. Physical Exam  Constitutional: She is oriented to person, place, and time. She appears well-developed.       obese  HENT:  Head: Normocephalic.  Neck: Neck supple. No thyromegaly present.  Cardiovascular: Normal rate and regular rhythm.   Pulmonary/Chest: Breath sounds normal. She has no wheezes.  Abdominal: She exhibits no distension. There is no tenderness.  Musculoskeletal:       Left RTC signs but with full AROM of shoulder.  Neurological: She is alert and oriented to person, place, and time.       Moves all 4's.  LE grossly 3+ to 4+ with pain inhibition proximally. No sensory findings. DTR's 1+.   Skin:  Back incision is clean and dry  Psychiatric: She has a normal mood and affect.    Results for orders placed during the hospital encounter of 12/29/11  (from the past 24 hour(s))  GLUCOSE, CAPILLARY     Status: Abnormal   Collection Time   12/31/11  6:38 AM      Component Value Range   Glucose-Capillary 114 (*) 70 - 99 (mg/dL)  GLUCOSE, CAPILLARY     Status: Abnormal   Collection Time   12/31/11 11:29 AM      Component Value Range   Glucose-Capillary 144 (*) 70 - 99 (mg/dL)  GLUCOSE, CAPILLARY     Status: Abnormal   Collection Time   12/31/11  4:52 PM      Component Value Range   Glucose-Capillary 151 (*) 70 - 99 (mg/dL)  GLUCOSE, CAPILLARY     Status: Abnormal   Collection Time   12/31/11 10:13 PM      Component Value Range   Glucose-Capillary 158 (*) 70 - 99 (mg/dL)   No results found.  Assessment/Plan: Diagnosis: lumbar spondylosis with radiculopathy 1. Does the need for close, 24 hr/day medical supervision in concert with the patient's rehab needs make it unreasonable for this patient to be served in a less intensive setting? No 2. Co-Morbidities requiring supervision/potential complications: CAD,DM 3. Due to bladder management, bowel management, safety and pain management, does the patient require 24 hr/day rehab nursing? No 4. Does the patient require coordinated care of a physician, rehab nurse, PT, OT to address physical and functional deficits in the context of the above medical diagnosis(es)? No Addressing deficits in the following areas: balance, endurance, locomotion, strength, transferring, bowel/bladder control, bathing, dressing, feeding, grooming and toileting 5. Can the patient actively participate in an intensive therapy program of at least 3 hrs of therapy per day at least 5 days per week? Potentially 6. The potential for patient to make measurable gains while on inpatient rehab is fair 7. Anticipated functional outcomes upon discharge from inpatients are  : not app 8. Estimated rehab length of stay to reach the above functional goals is: not app 9. Does the patient have adequate social supports to accommodate these  discharge functional goals? Yes 10. Anticipated D/C setting: Home 11. Anticipated post D/C treatments: [place] therapy 12. Overall Rehab/Functional Prognosis: excellent  RECOMMENDATIONS: This patient's condition is appropriate for continued rehabilitative care in the following setting: [facility] Patient has agreed to participate in recommended program. Potentially Note that insurance prior authorization may be required for reimbursement for recommended care.  Comment: Pt is already at a minimal assist level. I think with pain mgt and a few more days here on acute, she should be able to discharge home with family.   Oval Linsey, MD 01/01/2012

## 2012-01-01 NOTE — Progress Notes (Signed)
Physical Therapy Treatment Patient Details Name: [name] MRN: [id number] DOB: 1951-03-07 Today's Date: 01/01/2012  PT Assessment/Plan  PT - Assessment/Plan Comments on Treatment Session: Pt continues to have significant back & Lt shoulder pain.  She states pain is worse than before surgery.  Pt going for xray of Lt shoulder at end of session.   PT Plan: Discharge plan remains appropriate PT Frequency: Min 5X/week Follow Up Recommendations: Home health PT Equipment Recommended: None recommended by PT PT Goals  Acute Rehab PT Goals PT Goal: Supine/Side to Sit - Progress: Progressing toward goal PT Goal: Sit to Stand - Progress: Progressing toward goal PT Goal: Stand to Sit - Progress: Progressing toward goal PT Goal: Ambulate - Progress: Met  PT Treatment Precautions/Restrictions  Precautions Precautions: Back Precaution Booklet Issued: No Precaution Comments: Pt verbalized 2/3 back precautions.  No arching & No bending.  Reviewed all 3 precautions.   Required Braces or Orthoses: Yes Spinal Brace: Lumbar corset Mobility (including Balance) Bed Mobility Rolling Left: With rail;5: Supervision Rolling Left Details (indicate cue type and reason): Cues for technique & to reinforce back precautions Left Sidelying to Sit: 4: Min assist;With rails Left Sidelying to Sit Details (indicate cue type and reason): (A) to lift shoulders/trunk to upright. Pt with difficulty pushing through Lt UE due to shoulder pain.   Transfers Sit to Stand: Other (comment);From bed;With upper extremity assist;From chair/3-in-1;With armrests (Min Guard (A)) Sit to Stand Details (indicate cue type and reason): Cues for hand placement & decrease trunk flexion Stand to Sit: Other (comment);To chair/3-in-1;With upper extremity assist;With armrests (Min Guard (A)) Stand to Sit Details: Cues for body positioning before sitting, no twisting when locating armrests of chair, use of UE's to control descent.     Ambulation/Gait Ambulation/Gait Assistance Details (indicate cue type and reason): Guarding for safety due to pain.  Cues for safe technique to side-step between narrow spaces, to stay inside RW, increase/maintain upright posture.  Pt states pain is "excruciating".   Ambulation Distance (Feet): 50 Feet Assistive device: Rolling walker Gait Pattern: Step-through pattern;Decreased step length - right;Decreased step length - left;Decreased stride length;Trunk flexed Stairs: No Wheelchair Mobility Wheelchair Mobility: No    Exercise    End of Session PT - End of Session Equipment Utilized During Treatment: Gait belt;Back brace Activity Tolerance: Patient limited by pain Patient left: Other (comment) (going for x-ray of Lt shoulder. ) General Behavior During Session: [facility] for tasks performed Cognition: [facility] for tasks performed  Sena Hitch 01/01/2012, 11:22 AM [phone]

## 2012-01-02 ENCOUNTER — Encounter (HOSPITAL_COMMUNITY): Payer: Self-pay | Admitting: Neurosurgery

## 2012-01-02 DIAGNOSIS — N189 Chronic kidney disease, unspecified: Secondary | ICD-10-CM | POA: Diagnosis present

## 2012-01-02 DIAGNOSIS — E11649 Type 2 diabetes mellitus with hypoglycemia without coma: Secondary | ICD-10-CM | POA: Diagnosis not present

## 2012-01-02 LAB — BASIC METABOLIC PANEL
BUN: 39 mg/dL — ABNORMAL HIGH (ref 6–23)
CO2: 28 mEq/L (ref 19–32)
Calcium: 9 mg/dL (ref 8.4–10.5)
Chloride: 98 mEq/L (ref 96–112)
Creatinine, Ser: 1.68 mg/dL — ABNORMAL HIGH (ref 0.50–1.10)
GFR calc Af Amer: 37 mL/min — ABNORMAL LOW (ref >90)
GFR calc non Af Amer: 32 mL/min — ABNORMAL LOW (ref >90)
Glucose, Bld: 124 mg/dL — ABNORMAL HIGH (ref 70–99)
Potassium: 4.2 mEq/L (ref 3.5–5.1)
Sodium: 135 mEq/L (ref 135–145)

## 2012-01-02 LAB — GLUCOSE, CAPILLARY
Glucose-Capillary: 168 mg/dL — ABNORMAL HIGH (ref 70–99)
Glucose-Capillary: 172 mg/dL — ABNORMAL HIGH (ref 70–99)
Glucose-Capillary: 133 mg/dL — ABNORMAL HIGH (ref 70–99)

## 2012-01-02 MED ORDER — GLIMEPIRIDE 2 MG PO TABS
2.0000 mg | ORAL_TABLET | Freq: Two times a day (BID) | ORAL | Status: DC
  Administered 2012-01-02 (×2): 2 mg via ORAL
  Filled 2012-01-02 (×2): qty 1

## 2012-01-02 MED ORDER — INSULIN DETEMIR 100 UNIT/ML ~~LOC~~ SOLN
10.0000 [IU] | Freq: Every day | SUBCUTANEOUS | Status: DC
  Administered 2012-01-03 – 2012-01-04 (×2): 10 [IU] via SUBCUTANEOUS

## 2012-01-02 MED ORDER — INSULIN ASPART 100 UNIT/ML ~~LOC~~ SOLN
0.0000 [IU] | Freq: Three times a day (TID) | SUBCUTANEOUS | Status: DC
  Administered 2012-01-02 – 2012-01-03 (×3): 1 [IU] via SUBCUTANEOUS
  Administered 2012-01-03: 2 [IU] via SUBCUTANEOUS
  Administered 2012-01-04: 1 [IU] via SUBCUTANEOUS
  Administered 2012-01-04: 2 [IU] via SUBCUTANEOUS
  Filled 2012-01-02: qty 3

## 2012-01-02 MED ORDER — DOCUSATE SODIUM 283 MG RE ENEM
1.0000 | ENEMA | Freq: Every day | RECTAL | Status: DC
  Administered 2012-01-03 – 2012-01-04 (×2): 283 mg via RECTAL
  Administered 2012-01-05: 56.6 mg via RECTAL
  Filled 2012-01-02 (×4): qty 1

## 2012-01-02 MED ORDER — INSULIN DETEMIR 100 UNIT/ML ~~LOC~~ SOLN: 15.0000 [IU] | Freq: Every day | SUBCUTANEOUS | Status: DC

## 2012-01-02 NOTE — Progress Notes (Signed)
Physical Therapy Treatment Patient Details Name: [name] MRN: [id number] DOB: 1951-03-16 Today's Date: 01/02/2012  PT Assessment/Plan  PT - Assessment/Plan Comments on Treatment Session: Mobility limited by pain.  New c/o pain in Rt. hip today along with back & Lt shoulder.  PT Plan: Discharge plan remains appropriate PT Frequency: Min 5X/week Follow Up Recommendations: Home health PT PT Goals  Acute Rehab PT Goals Pt will go Sit to Stand: with modified independence PT Goal: Sit to Stand - Progress: Updated due to goal met Pt will go Stand to Sit: with modified independence PT Goal: Stand to Sit - Progress: Updated due to goals met Pt will Ambulate: >150 feet;with modified independence;with least restrictive assistive device PT Goal: Ambulate - Progress: Updated due to goal met  PT Treatment Precautions/Restrictions  Precautions Precautions: Back Precaution Booklet Issued: No Precaution Comments: Pt verbalized 3/3 back precautions. Required Braces or Orthoses: Yes Spinal Brace: Lumbar corset Mobility (including Balance) Bed Mobility Bed Mobility: No (Pt deferred bed mobility.  "that's too much today") Transfers Sit to Stand: From chair/3-in-1;With upper extremity assist;5: Supervision;With armrests Sit to Stand Details (indicate cue type and reason): Cues to decrease trunk flexion Stand to Sit: 5: Supervision;To chair/3-in-1;With upper extremity assist;With armrests Stand to Sit Details: Cues for back precautions & use of UE's to control descent.  Ambulation/Gait Ambulation/Gait Assistance: 5: Supervision Ambulation/Gait Assistance Details (indicate cue type and reason): Cues to decrease reliance of UE's on RW & maintain upright posture.   Ambulation Distance (Feet): 120 Feet Assistive device: Rolling walker Gait Pattern: Step-through pattern;Decreased step length - right;Decreased step length - left;Decreased stride length Stairs: No Wheelchair Mobility Wheelchair  Mobility: No    Exercise    End of Session PT - End of Session Equipment Utilized During Treatment: Gait belt;Back brace Activity Tolerance: Patient limited by pain Patient left: in chair;with call bell in reach;Other (comment) (NT present to assist with bath.  ) General Behavior During Session: [facility] for tasks performed Cognition: [facility] for tasks performed  Sena Hitch 01/02/2012, 11:12 AM  [phone]

## 2012-01-02 NOTE — Progress Notes (Signed)
Patient ID: [name], female   DOB: 1951-02-15, 61 y.o.   MRN: [id number] In two ocasions my office called for a hospitalist consult to get help taken care of [name] dm . i will try to call again today. Co of pain in left shoulder. Ortho to see her. Also incisional pain. [employer] approved her surgery under the condition that she will get home hralth care. Patient is afraid of it because after cervical fusion according to her [employer] did not [pay and still she is paying that bill. will get social worker involved to help.

## 2012-01-02 NOTE — Progress Notes (Signed)
[facility] [phone] (pager)

## 2012-01-02 NOTE — Progress Notes (Signed)
Occupational Therapy Treatment Patient Details Name: [name] MRN: [id number] DOB: Aug 04, 1951 Today's Date: 01/02/2012 14:55-15:37  3sc OT Assessment/Plan OT Assessment/Plan Comments on Treatment Session: Pt making steady progress with OT to a current overall supervision level with simulated selfcare tasks.  Still needs min assist for lifting LEs over edge of the tub.  Still with complaints of shoulder pain but not limiting ADL function in this setting. OT Plan: Discharge plan remains appropriate Follow Up Recommendations: Home health OT OT Goals ADL Goals ADL Goal: Grooming - Progress: Met ADL Goal: Upper Body Dressing - Progress: Met ADL Goal: Lower Body Dressing - Progress: Met ADL Goal: Toilet Transfer - Progress: Met ADL Goal: Toileting - Clothing Manipulation - Progress: Met ADL Goal: Toileting - Hygiene - Progress: Progressing toward goals ADL Goal: Tub/Shower Transfer - Progress: Met  OT Treatment Precautions/Restrictions  Precautions Precautions: Back Required Braces or Orthoses: Yes Spinal Brace: Lumbar corset Restrictions Weight Bearing Restrictions: No   ADL ADL Grooming: Performed;Wash/dry hands;Supervision/safety Where Assessed - Grooming: Standing at sink Upper Body Dressing: Supervision/safety Upper Body Dressing Details (indicate cue type and reason): Pt able to donn Lumbar corsett sitting EOB. Where Assessed - Upper Body Dressing: Sitting, bed;Unsupported Lower Body Dressing: Performed;Supervision/safety Lower Body Dressing Details (indicate cue type and reason): Using AE for donning gripper socks with min instructional cues. Where Assessed - Lower Body Dressing: Sit to stand from bed Toilet Transfer: Performed;Supervision/safety Toilet Transfer Details (indicate cue type and reason): Pt utilized RW for transfer to bathroom. Toilet Transfer Method: Counselling psychologist: Raised toilet seat with arms (or 3-in-1 over toilet) Toileting -  Clothing Manipulation: Supervision/safety Where Assessed - Toileting Clothing Manipulation: Sit to stand from 3-in-1 or toilet Toileting - Hygiene: Performed;Set up [place] Details (indicate cue type and reason): Just for front peri care only. Where Assessed - Toileting Hygiene: Sit on 3-in-1 or toilet Tub/Shower Transfer: Minimal assistance Tub/Shower Transfer Details (indicate cue type and reason): Pt requires assist for lifting LEs over edge of the tub. Tub/Shower Transfer Method: Therapist, art: IT consultant Used: Rolling walker Ambulation Related to ADLs: Pt overall supervision for ambulation to and from the tub and to the bathroom using a RW and lumbar corsett. ADL Comments: Pt able to utilize AE for LB selfcare.  Practiced tub transfers using tub bench.  Pt thinks that she already has one she can use from a friend. Mobility  Bed Mobility Bed Mobility: Yes Rolling Left: With rail;5: Supervision Left Sidelying to Sit: 4: Min assist;With rails;HOB flat Sitting - Scoot to Edge of Bed: 5: Supervision Transfers Transfers: Yes Sit to Stand: 5: Supervision;From bed;From chair/3-in-1;With upper extremity assist   End of Session OT - End of Session Equipment Utilized During Treatment: Gait belt;Other (comment) (Rolling walker) Activity Tolerance: Patient tolerated treatment well Patient left: in bed;with call bell in reach General Behavior During Session: [facility] for tasks performed Cognition: [facility] for tasks performed  Azarah Dacy OTR/L 01/02/2012, 4:41 PM Pager number [phone]

## 2012-01-02 NOTE — Progress Notes (Signed)
Patient with small bm; per pt. Passing lots of gas. Offered docusate sodium enema; at this time pt. Refuses but will let this RN know if she wants it.

## 2012-01-02 NOTE — Progress Notes (Signed)
Home Health is recommended. Not CIR at this time. Pager [phone]

## 2012-01-02 NOTE — Consults (Signed)
PCP: At [facility]. Primary cardiologist: Dr. Minus Breeding Primary nephrologist: Dr. Cristela Felt  Referring MD: Dr. Leeroy Cha  Reason for consultation: Evaluation and management of hypoglycemia in diabetic patient.   Chief Complaint:  Left shoulder pain and weakness.  HPI: 61 year old African American female patient with history of long-standing type 2 diabetes mellitus/insulin-dependent, hypertension, hyperlipidemia, coronary artery disease status post PCI, TIA, chronic kidney disease with baseline creatinine possibly of 1.4 who had decompressive laminectomies L2-3. Patient experienced hypoglycemic episodes yesterday and the hospitalist service has been requested to evaluate and assist in management. Patient indicates that she has been started on Levemir earlier this year, by her primary care physician. Initially she had a few hypoglycemic episodes usually during the afternoons but have since resolved. She was taken off metformin secondary to worsening renal functions. Her Actos dose has been adjusted multiple times. She denies any recent episodes of hypoglycemia at home. Last hemoglobin A1c in December 2012 according to her was 7.2. She checks her fasting blood sugar once in the morning which has ranged from 162-113 mg/dL. Apart from left shoulder pain and some left upper extremity weakness patient denies any complaints. She declined last nights does of Levemir because she was worried that her blood sugars would drop. Patient volunteers to decreased by mouth intake in the hospital secondary to not liking the food.  Past Medical History: Past Medical History  Diagnosis Date  . Esophageal stricture   . Hyperlipidemia   . Hypertension   . Bronchitis   . CAD (coronary artery disease)     Stenting in 2005. 2009 cardiac cath. Demonstrated questionable mitral regurgitation. Left main was free of critical disease, the LAD had mild plaquing, circumflex had some mild  luminal irregularities, there is ramus intermediate with 40% stenosis prior to stent. The stent itself was widely patent, the RCA had 40% proximal stenosis.  . Arthritis   . Chronic kidney disease     CREATININE IS UP--GOING TO KIDNEY MD   . Non-insulin dependent diabetes mellitus     Past Surgical History: Past Surgical History  Procedure Date  . Cardiac catheterization 2009  . Bladder surgery     Tack  . Partial hysterectomy   . Shoulder surgery     Right  . Cervical spine surgery   . Back surgery     2007 & 2012  . Abdominal hysterectomy   . Laminectomy 12/29/2011  . Cardiac stents   . Lumbar laminectomy/decompression microdiscectomy 12/29/2011    Procedure: LUMBAR LAMINECTOMY/DECOMPRESSION MICRODISCECTOMY;  Surgeon: Floyce Stakes, MD;  Location: [facility];  Service: Neurosurgery;  Laterality: N/A;  Lumbar Two through Lumbar Five Laminectomies/Cellsaver    Allergies:   Allergies  Allergen Reactions  . Ace Inhibitors   . Clopidogrel Bisulfate     REACTION: Sick, Headache, "Felt terrible"  . Codeine     REACTION: hallucinations  . Penicillins     Medications: Prior to Admission medications   Medication Sig Start Date End Date Taking? Authorizing Provider  aspirin 81 MG EC tablet Take 81 mg by mouth daily.     Yes Historical Provider, MD  atenolol (TENORMIN) 50 MG tablet Take 50 mg by mouth 2 (two) times daily.     Yes Historical Provider, MD  gabapentin (NEURONTIN) 300 MG capsule Take 300 mg by mouth 3 (three) times daily.  09/19/11  Yes Historical Provider, MD  glimepiride (AMARYL) 4 MG tablet Take 4 mg by mouth 2 (two) times daily.    Yes  Historical Provider, MD  insulin detemir (LEVEMIR) 100 UNIT/ML injection Inject 27 Units into the skin daily.    Yes Historical Provider, MD  insulin lispro (HUMALOG) 100 UNIT/ML injection Inject 6 Units into the skin. 6 units for blood sugar readings greater than 300.    Yes Historical Provider, MD  NIFEdipine (PROCARDIA  XL/ADALAT-CC) 90 MG 24 hr tablet Take 90 mg by mouth daily.   Yes Historical Provider, MD  pioglitazone (ACTOS) 30 MG tablet Take 30 mg by mouth daily.     Yes Historical Provider, MD  rosuvastatin (CRESTOR) 20 MG tablet Take 20 mg by mouth daily.     Yes Historical Provider, MD  valsartan-hydrochlorothiazide (DIOVAN-HCT) 320-25 MG per tablet Take 1 tablet by mouth daily.     Yes Historical Provider, MD    Family History: Family History  Problem Relation Age of Onset  . Heart disease Other     Social History:  reports that she quit smoking about 10 years ago. She has never used smokeless tobacco. She reports that she does not drink alcohol or use illicit drugs.  Review of Systems:  All systems reviewed and apart from history of presenting illness are negative. Patient denies urinary frequency or dysuria. She complains of left shoulder pain and some left upper extremity weakness which is being evaluated by the primary service and orthopedic consultation has been requested.  Physical Exam: Filed Vitals:   01/01/12 1833 01/01/12 2200 01/02/12 0200 01/02/12 0600  BP: 131/68 125/74 126/73 97/58  Pulse: 74 79 78 73  Temp: 98.2 F (36.8 C) 99.3 F (37.4 C) 99 F (37.2 C) 98.8 F (37.1 C)  TempSrc: Oral Oral Oral Oral  Resp: 18 18 19 19   Height:      Weight:      SpO2: 96% 95% 96% 94%   General exam: Moderately built and overweight female patient who is lying comfortably supine in bed and is in no obvious distress. Head, eyes and ENT: Nontraumatic and normocephalic. Pupils equally reacting to light and accommodation. Oral mucosa moist and no acute findings. Patient has facial hair/mustache. Lymphatics: No lymphadenopathy. Neck: Supple. No JVD or carotid bruit. Hospital system: Clear. No increased work of breathing. Cardiovascular system: First and second heart sounds heard, regular. No JVD or carotid bruit. No pedal edema. Estrin to sternal system: Abdomen is obese, soft, nontender  and normal bowel sounds heard. No organomegaly or masses appreciated. Central nervous system: Alert and oriented. No cranial nerve deficits. Extremities: Slightly painful range of movement of the left shoulder and question reduced/4+ by 5 grip in the left hand. No other acute findings of the left shoulder. Other limbs are 5 x 5 power. And Musculoskeletal system: Steri-Strips over the surgical scar in the lower mid lumbar spine. The site looks clean dry and intact. Skin: Without any rashes.    Lab data:  1. CBC today has ranged from 105-1 72 mg/dL. 2. BMP on 12/25/2011: Significant for BUN 43 and creatinine 1.75 and albumin of 3.2 3. CBC on 21st of January 2013: Hemoglobin 11.6   Radiological Exams on Admission: Dg Lumbar Spine 2-3 Views  12/29/2011  *RADIOLOGY REPORT*  Clinical Data: Laminectomy L2-L5.  LUMBAR SPINE - 2-3 VIEW  Comparison: MRI 05/29/2011  Findings: First lateral intraoperative image demonstrates posterior surgical instruments along the spinous process of L2 and L4.  Second lateral intraoperative image demonstrates posterior needles directed at the L2 vertebral body and L5-S1 disc space.  IMPRESSION: Intraoperative localization as above.  Original Report  Authenticated By: Raelyn Number, M.D.   Dg Shoulder Left  01/01/2012  *RADIOLOGY REPORT*  Clinical Data: Left shoulder pain.  No known injuries.  Recent lumbar spine surgery.  LEFT SHOULDER - 2+ VIEW 01/01/2012:  Comparison: None.  Findings: No evidence of acute or subacute fracture or dislocation. Narrowed subacromial space.  Hypertrophic spurring involving the humeral head and the glenoid.  Degenerative changes involving the acromioclavicular joint.  IMPRESSION: No acute or subacute osseous abnormality.  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  Degenerative changes involving the [facility] joint.  Original Report Authenticated By: Deniece Portela, M.D.       Assessment/Plan 1. Hypoglycemia in type 2 diabetes mellitus: Possibly secondary to poor oral intake in the context of medications including Levemir, Amaryl and Actos. Rule out worsening renal insufficiency as a cause. Reduced both Levemir and Amaryl to about half the home dose. Monitor closely. If patient has further episodes of hypoglycemia then discontinue Amaryl and Actos completely and reduce or discontinue Levemir. Place on sensitive sliding scale insulin. Check hemoglobin A1c. Encouraged patient to eat adequately. 2. Chronic kidney disease, stage III: According to patient her creatinine prior to hospitalization was 1.4. She has an appointment to see her nephrologist in February. We'll repeat BMP stat. 3. Hypertension: Controlled. Continue atenolol. 4. Anemia: Stable   Will continue to follow patient.  Liat Mayol 01/02/2012, 4:15 PM

## 2012-01-03 LAB — URINALYSIS, ROUTINE W REFLEX MICROSCOPIC
Bilirubin Urine: NEGATIVE
Glucose, UA: NEGATIVE mg/dL
Hgb urine dipstick: NEGATIVE
Ketones, ur: NEGATIVE mg/dL
Leukocytes, UA: NEGATIVE
Nitrite: NEGATIVE
Protein, ur: NEGATIVE mg/dL
Specific Gravity, Urine: 1.007 (ref 1.005–1.030)
Urobilinogen, UA: 0.2 mg/dL (ref 0.0–1.0)
pH: 6 (ref 5.0–8.0)

## 2012-01-03 LAB — GLUCOSE, CAPILLARY
Glucose-Capillary: 185 mg/dL — ABNORMAL HIGH (ref 70–99)
Glucose-Capillary: 133 mg/dL — ABNORMAL HIGH (ref 70–99)
Glucose-Capillary: 164 mg/dL — ABNORMAL HIGH (ref 70–99)
Glucose-Capillary: 130 mg/dL — ABNORMAL HIGH (ref 70–99)

## 2012-01-03 LAB — HEMOGLOBIN A1C
Hgb A1c MFr Bld: 6.8 % — ABNORMAL HIGH (ref <5.7)
Mean Plasma Glucose: 148 mg/dL — ABNORMAL HIGH (ref <117)

## 2012-01-03 NOTE — Progress Notes (Signed)
Subjective: Patient seen and examined this am. No further hypoglycemic episodes. informs her po intake has been poor since admission.  Objective:  Vital signs in last 24 hours:  Filed Vitals:   01/02/12 2159 01/03/12 0157 01/03/12 0650 01/03/12 1030  BP: 138/79 124/71 135/75 127/75  Pulse: 82 74 76 68  Temp: 98.8 F (37.1 C) 98 F (36.7 C) 98.4 F (36.9 C) 97.3 F (36.3 C)  TempSrc: Oral Oral Oral Oral  Resp: 20 20 20 18   Height:      Weight:      SpO2: 94% 98% 99% 96%    Intake/Output from previous day:   Intake/Output Summary (Last 24 hours) at 01/03/12 1215 Last data filed at 01/03/12 0815  Gross per 24 hour  Intake    360 ml  Output      0 ml  Net    360 ml    Physical Exam:  General: elderly female in no acute distress. HEENT: no pallor, no icterus, moist oral mucosa, no JVD, no lymphadenopathy Heart: Normal  s1 &s2  Regular rate and rhythm, without murmurs, rubs, gallops. Lungs: Clear to auscultation bilaterally. Abdomen: Soft, nontender, nondistended, positive bowel sounds. Extremities: No clubbing cyanosis or edema with positive pedal pulses. restricted ROM over left shoulder Neuro: Alert, awake, oriented x3, nonfocal.   Lab Results:  Basic Metabolic Panel:    Component Value Date/Time   NA 135 01/02/2012 1624   K 4.2 01/02/2012 1624   CL 98 01/02/2012 1624   CO2 28 01/02/2012 1624   BUN 39* 01/02/2012 1624   CREATININE 1.68* 01/02/2012 1624   GLUCOSE 124* 01/02/2012 1624   CALCIUM 9.0 01/02/2012 1624   CBC:    Component Value Date/Time   WBC 8.9 12/25/2011 1312   HGB 11.6* 12/25/2011 1312   HCT 35.5* 12/25/2011 1312   PLT 293 12/25/2011 1312   MCV 82.4 12/25/2011 1312   NEUTROABS 5.0 06/17/2008 0625   LYMPHSABS 3.6 06/17/2008 0625   MONOABS 0.9 06/17/2008 0625   EOSABS 0.3 06/17/2008 0625   BASOSABS 0.0 06/17/2008 0625    Recent Results (from the past 240 hour(s))  SURGICAL PCR SCREEN     Status: Normal   Collection Time   12/25/11  1:12 PM   Component Value Range Status Comment   MRSA, PCR NEGATIVE  NEGATIVE  Final    Staphylococcus aureus NEGATIVE  NEGATIVE  Final     Studies/Results: No results found.  Medications: Scheduled Meds:   . atenolol  50 mg Oral BID  . docusate sodium  100 mg Oral BID  . docusate sodium  1 enema Rectal Daily  . gabapentin  300 mg Oral TID  . insulin aspart  0-9 Units Subcutaneous TID WC  . insulin detemir  10 Units Subcutaneous QHS  . NIFEdipine  90 mg Oral Daily  . pioglitazone  30 mg Oral Daily  . rosuvastatin  20 mg Oral q1800  . sodium chloride  3 mL Intravenous Q12H  . DISCONTD: glimepiride  2 mg Oral BID AC  . DISCONTD: insulin detemir  15 Units Subcutaneous QHS  . DISCONTD: insulin detemir  27 Units Subcutaneous QHS   Continuous Infusions:   . sodium chloride    . DISCONTD: sodium chloride 75 mL/hr at 12/29/11 1141   PRN Meds:.acetaminophen, acetaminophen, cyclobenzaprine, menthol-cetylpyridinium, morphine, ondansetron (ZOFRAN) IV, oxyCODONE-acetaminophen, phenol, sodium chloride, sorbitol, zolpidem  Assessment 61 year old [place] American female patient with history of long-standing type 2 diabetes mellitus/insulin-dependent, hypertension, hyperlipidemia, coronary artery disease status  post PCI, TIA, chronic kidney disease with baseline creatinine of around 1.4 who had decompressive laminectomies L2-3 this admission. Patient experienced hypoglycemic episodes on 1/28 and the hospitalist service consulted for assisting on management of her diabetes and hypoglycemia.  Recommendations: Insulin dependent diabetes mellitus with hypoglycemic episodes  patient on levemir 27 units at home, along with po actos and amaryl amaryl and levemir held yesterday. Her fsg has been running in 130s. HbA1C this admission of 6.8. i would recommended discontinuing amaryl. Will continue actos and resume levemir at 10 units at bedtime. Given her renal function, metformin will not be a suitable  alternative. She wants to be off insulin . Given hypoglycemic episodes, other oral hypoglycemics are also nor recommended for now. Her A1C has been fairly stable over past 3 months and should be stable on lower dose of long acting insulin. Cont neurontin Will check UA for proteinuria  Chronic kidney disease, stage III:  As per patient her creatinine prior to hospitalization was 1.4. She informs having appointment to see her nephrologist in February. Creatinine of 1.68 this admission.  Hypertension Stable  cont home meds  Constipation  on bowel regime and prn enemas  Rest of the treatment plan as per primary team   LOS: 5 days   [name] 01/03/2012, 12:15 PM

## 2012-01-03 NOTE — Progress Notes (Signed)
Patient ID: [name], female   DOB: September 01, 1951, 61 y.o.   MRN: [id number] Decrease pain left shoulder but still lumbar  Incisional lhospitalist helping wiwaiting to resolve help at home before dischatge.th diabetes control.  pain with some dysesthesias.

## 2012-01-03 NOTE — Progress Notes (Signed)
Utilization review completed. Rozanna Boer, RN, BSN. 01/03/12

## 2012-01-03 NOTE — Progress Notes (Signed)
Physical Therapy Treatment Patient Details Name: [name] MRN: [id number] DOB: [birth year] Today's Date: 01/03/2012  PT Assessment/Plan  PT - Assessment/Plan Comments on Treatment Session: Pt still with pain in right hip, but tolerated increased mobility todday including stairs.  Continue to rec HHPT, pt agreeable PT Plan: Discharge plan remains appropriate;Frequency remains appropriate PT Frequency: Min 5X/week Follow Up Recommendations: Home health PT Equipment Recommended: None recommended by PT PT Goals  Acute Rehab PT Goals PT Goal Formulation: With patient Time For Goal Achievement: 2 weeks Pt will go Supine/Side to Sit: Independently;with HOB 0 degrees PT Goal: Supine/Side to Sit - Progress: Progressing toward goal Pt will go Sit to Supine/Side: Independently;with HOB 0 degrees PT Goal: Sit to Supine/Side - Progress: Progressing toward goal Pt will go Sit to Stand: with modified independence PT Goal: Sit to Stand - Progress: Progressing toward goal Pt will go Stand to Sit: with modified independence PT Goal: Stand to Sit - Progress: Progressing toward goal Pt will Transfer Bed to Chair/Chair to Bed: with supervision PT Transfer Goal: Bed to Chair/Chair to Bed - Progress: Progressing toward goal Pt will Ambulate: >150 feet;with modified independence;with least restrictive assistive device PT Goal: Ambulate - Progress: Progressing toward goal Pt will Perform Home Exercise Program: Independently PT Goal: Perform Home Exercise Program - Progress: Other (comment) (NT)  PT Treatment Precautions/Restrictions  Precautions Precautions: Back Precaution Booklet Issued: No Precaution Comments: Pt verbalized 3/3 back precautions. Required Braces or Orthoses: Yes Spinal Brace: Lumbar corset Restrictions Weight Bearing Restrictions: No Mobility (including Balance) Bed Mobility Bed Mobility: Yes Rolling Left: 6: Modified independent (Device/Increase time);With rail Left  Sidelying to Sit: 6: Modified independent (Device/Increase time);HOB flat Sitting - Scoot to Edge of Bed: 6: Modified independent (Device/Increase time) Transfers Transfers: Yes Sit to Stand: 5: Supervision;From bed;With upper extremity assist;From chair/3-in-1 Sit to Stand Details (indicate cue type and reason): vc's for precautions with toileting, pt twisted slightly getting up and experienced increased back pain Stand to Sit: 5: Supervision;To chair/3-in-1;With upper extremity assist;With armrests Ambulation/Gait Ambulation/Gait: Yes Ambulation/Gait Assistance: 5: Supervision Ambulation/Gait Assistance Details (indicate cue type and reason): Pt ambulates with significant wt through RW and c/o continued numbess in buttocks and thighs Ambulation Distance (Feet): 200 Feet Assistive device: Rolling walker Gait Pattern: Step-through pattern;Decreased stride length Gait velocity: decreased Stairs: Yes Stairs Assistance: 4: Min assist (min-guard) Stairs Assistance Details (indicate cue type and reason): pt leads with right leg as it is the stronger leg, painful on steps but sufficient strength to get in/out of home Stair Management Technique: Two rails;Step to pattern;Forwards Number of Stairs: 5  Wheelchair Mobility Wheelchair Mobility: No  Posture/Postural Control Posture/Postural Control: Postural limitations Postural Limitations: fwd flexed posture until cued Balance Balance Assessed: No End of Session PT - End of Session Equipment Utilized During Treatment: Gait belt;Back brace Activity Tolerance: Patient limited by pain Patient left: in chair;with call bell in reach Nurse Communication: Mobility status for ambulation General Behavior During Session: [facility] for tasks performed Cognition: [facility] for tasks performed  [place], [place] 01/03/2012, 2:02 PM

## 2012-01-04 ENCOUNTER — Inpatient Hospital Stay (HOSPITAL_COMMUNITY): Payer: BC Managed Care – PPO

## 2012-01-04 LAB — GLUCOSE, CAPILLARY
Glucose-Capillary: 148 mg/dL — ABNORMAL HIGH (ref 70–99)
Glucose-Capillary: 123 mg/dL — ABNORMAL HIGH (ref 70–99)
Glucose-Capillary: 156 mg/dL — ABNORMAL HIGH (ref 70–99)
Glucose-Capillary: 113 mg/dL — ABNORMAL HIGH (ref 70–99)
Glucose-Capillary: 175 mg/dL — ABNORMAL HIGH (ref 70–99)

## 2012-01-04 NOTE — Progress Notes (Signed)
Physical Therapy Treatment Patient Details Name: JANNETH OVERSTREET MRN: [id number] DOB: 1951-07-31 Today's Date: 01/04/2012  PT Assessment/Plan  PT - Assessment/Plan Comments on Treatment Session: Pt progressing well, with little complaints of bain in buttock or shoulder. Pt is at a safe functional level for d/c home PT Plan: Discharge plan remains appropriate;Frequency remains appropriate PT Frequency: Min 5X/week Follow Up Recommendations: Home health PT Equipment Recommended: None recommended by PT PT Goals  Acute Rehab PT Goals PT Goal Formulation: With patient PT Goal: Sit to Stand - Progress: Progressing toward goal PT Goal: Stand to Sit - Progress: Progressing toward goal PT Transfer Goal: Bed to Chair/Chair to Bed - Progress: Progressing toward goal PT Goal: Ambulate - Progress: Progressing toward goal PT Goal: Perform Home Exercise Program - Progress: Progressing toward goal  PT Treatment Precautions/Restrictions  Precautions Precautions: Back Precaution Booklet Issued: No Precaution Comments: Pt verbalized 3/3 back precautions. Required Braces or Orthoses: Yes Spinal Brace: Lumbar corset Restrictions Weight Bearing Restrictions: No Mobility (including Balance) Bed Mobility Bed Mobility: No Transfers Transfers: Yes Sit to Stand: 5: Supervision;From bed;With upper extremity assist;From chair/3-in-1 Sit to Stand Details (indicate cue type and reason): VC for hand placement for safety from RW. Reminders for back precautions Stand to Sit: 5: Supervision;To chair/3-in-1;With upper extremity assist;With armrests Stand to Sit Details: VC for hand placement for safety from RW Ambulation/Gait Ambulation/Gait: Yes Ambulation/Gait Assistance: 5: Supervision Ambulation/Gait Assistance Details (indicate cue type and reason): Pt improved ambulation this session, still required cueing for safe distance to RW. Ambulation Distance (Feet): 200 Feet Assistive device: Rolling  walker Gait Pattern: Step-through pattern;Decreased stride length Gait velocity: decreased Stairs: Yes Stairs Assistance: 5: Supervision Stairs Assistance Details (indicate cue type and reason): VC for proper gait sequencing on steps. Pt encouraged to complete step to gait pattern for ease Stair Management Technique: Two rails;Step to pattern;Forwards (completed 3 times) Number of Stairs: 4     Exercise    End of Session PT - End of Session Equipment Utilized During Treatment: Gait belt;Back brace Activity Tolerance: Patient tolerated treatment well Patient left: in chair;with call bell in reach;with family/visitor present Nurse Communication: Mobility status for ambulation General Behavior During Session: [facility] for tasks performed Cognition: [facility] for tasks performed  Ambrose Finland 01/04/2012, 4:22 PM  01/04/2012 Ambrose Finland DPT PAGER: [phone] OFFICE: [phone]

## 2012-01-04 NOTE — Progress Notes (Signed)
Subjective: Patient seen and examined this am. Still c/o left shoulder pain but better than yesterday. fsg remains stable between 120-160.  Objective:  Vital signs in last 24 hours:  Filed Vitals:   01/03/12 2214 01/04/12 0548 01/04/12 1033 01/04/12 1045  BP: 131/74 116/71 140/61 132/78  Pulse: 79 75 79 79  Temp: 98.4 F (36.9 C) 98.7 F (37.1 C) 98.4 F (36.9 C) 97.9 F (36.6 C)  TempSrc: Oral Oral Oral Oral  Resp: 20 18 18 18   Height:      Weight:      SpO2: 92% 96% 99% 96%    Intake/Output from previous day:  No intake or output data in the 24 hours ending 01/04/12 1352  Physical Exam:  General: elderly female in no acute distress.  HEENT: no pallor, no icterus, moist oral mucosa, no JVD, no lymphadenopathy  Heart: Normal s1 &s2 Regular rate and rhythm, without murmurs, rubs, gallops.  Lungs: Clear to auscultation bilaterally.  Abdomen: Soft, nontender, nondistended, positive bowel sounds.  Extremities: No clubbing cyanosis or edema with positive pedal pulses. restricted ROM over left shoulder  Neuro: Alert, awake, oriented x3, nonfocal.   Lab Results:  Basic Metabolic Panel:    Component Value Date/Time   NA 135 01/02/2012 1624   K 4.2 01/02/2012 1624   CL 98 01/02/2012 1624   CO2 28 01/02/2012 1624   BUN 39* 01/02/2012 1624   CREATININE 1.68* 01/02/2012 1624   GLUCOSE 124* 01/02/2012 1624   CALCIUM 9.0 01/02/2012 1624   CBC:    Component Value Date/Time   WBC 8.9 12/25/2011 1312   HGB 11.6* 12/25/2011 1312   HCT 35.5* 12/25/2011 1312   PLT 293 12/25/2011 1312   MCV 82.4 12/25/2011 1312   NEUTROABS 5.0 06/17/2008 0625   LYMPHSABS 3.6 06/17/2008 0625   MONOABS 0.9 06/17/2008 0625   EOSABS 0.3 06/17/2008 0625   BASOSABS 0.0 06/17/2008 0625    No results found for this or any previous visit (from the past 240 hour(s)).  Studies/Results: No results found.  Medications: Scheduled Meds:   . atenolol  50 mg Oral BID  . docusate sodium  100 mg Oral BID  .  docusate sodium  1 enema Rectal Daily  . gabapentin  300 mg Oral TID  . insulin aspart  0-9 Units Subcutaneous TID WC  . insulin detemir  10 Units Subcutaneous QHS  . NIFEdipine  90 mg Oral Daily  . pioglitazone  30 mg Oral Daily  . rosuvastatin  20 mg Oral q1800  . sodium chloride  3 mL Intravenous Q12H   Continuous Infusions:   . sodium chloride     PRN Meds:.acetaminophen, acetaminophen, cyclobenzaprine, menthol-cetylpyridinium, morphine, ondansetron (ZOFRAN) IV, oxyCODONE-acetaminophen, phenol, sodium chloride, sorbitol, zolpidem  Assessment  61 year old [place] American female patient with history of long-standing type 2 diabetes mellitus/insulin-dependent, hypertension, hyperlipidemia, coronary artery disease status post PCI, TIA, chronic kidney disease with baseline creatinine of around 1.4 who had decompressive laminectomies L2-3 this admission. Patient experienced hypoglycemic episodes on 1/28 and the hospitalist service consulted for assisting on management of her diabetes and hypoglycemia.   Recommendations:  Insulin dependent diabetes mellitus with hypoglycemic episodes  patient on levemir 27 units at home, along with po actos and amaryl  amaryl and levemir held on 1/29. Her fsg Was been running in 130s yesterday. For past 24 hrs it has been 120-160s HbA1C this admission of 6.8.  - recommended discontinuing amaryl. continue actos and resumed levemir at 10 units at bedtime. Given  her renal function, metformin will not be a suitable alternative. She wants to be off insulin . Given hypoglycemic episodes, other oral hypoglycemics are also not recommended for now.  Her A1C has been fairly stable over past 3 months and should be stable on lower dose of long acting insulin.  Cont neurontin  UA negative proteinuria  Given her fsg to be fairly stable on this regimen she can be discharged home on 10 units levemir at bedtime with po actos at current ( home) dose and d/c amaryl She should  follow up with her PCP closely as outpatient  Chronic kidney disease, stage III:  As per patient her creatinine prior to hospitalization was 1.4. She informs having appointment to see her nephrologist in February. Creatinine of 1.68 this admission.  Should follow up as outpatient.  Hypertension  Stable  cont home meds   Constipation  on bowel regime and prn enemas  Rest of the treatment plan as per primary team  Medical team will sign off for now. Please call us for any concerns.   Thank you for allowing [place] to participate in patient's care    LOS: 6 days   Ellyn Rubiano 01/04/2012, 1:52 PM

## 2012-01-04 NOTE — Progress Notes (Signed)
Patient ID: [name], female   DOB: [birth year], 61 y.o.   MRN: [id number] C/o pain left shoulder. To be seen by dr French Ana as outpatient.will get a mri before dc.

## 2012-01-05 LAB — GLUCOSE, CAPILLARY: Glucose-Capillary: 120 mg/dL — ABNORMAL HIGH (ref 70–99)

## 2012-01-05 NOTE — Discharge Summary (Signed)
Pt given d/c instructions along with f/u apt to be made and prescriptions.  Pt d/c'd home via w/c accompanied by medical staff and family donning brace.

## 2012-01-05 NOTE — Discharge Summary (Signed)
Physician Discharge Summary  Patient ID: [name] MRN: [id number] DOB/AGE: [birth year] 61 y.o.  Admit date: 12/29/2011 Discharge date: 01/05/2012  Admission Diagnoses:lbp  Discharge Diagnoses: lbp Active Problems:  DM  HYPERTENSION, BENIGN  Type 2 diabetes mellitus with hypoglycemia  CKD (chronic kidney disease)   Discharged Condition: fair  Hospital Course:lumbar fusion done with hardware  Consults: hospitalist Significant Diagnostic Studies: mri lumbar and shoulder  Treatments: lumbar fudion Discharge Exam: Blood pressure 150/76, pulse 78, temperature 98.7 F (37.1 C), temperature source Oral, resp. rate 20, height 5\' 2"  (1.575 m), weight 98.884 kg (218 lb), SpO2 97.00%. Pain left shoulder,? Tear.  Incisional pain Disposition: Home-Health Care Svc  Discharge Orders    Future Orders Please Complete By Expires   Ambulatory referral to [facility]      Comments:   Please evaluate SHAHD VENZOR for admission to [facility].  Disciplines requested: Physical Therapy and Occupational Therapy  Services to provide:   Physician to follow patient's care (the person listed here will be responsible for signing ongoing orders): Referring Provider Evaluate Requested Start of Care Date: Within 2-3 days  Special Instructions:  Lumbar fusion      Medication List  As of 01/05/2012 10:12 AM   STOP taking these medications         atenolol 50 MG tablet         TAKE these medications         aspirin 81 MG EC tablet   Take 81 mg by mouth daily.      gabapentin 300 MG capsule   Commonly known as: NEURONTIN   Take 300 mg by mouth 3 (three) times daily.      glimepiride 4 MG tablet   Commonly known as: AMARYL   Take 4 mg by mouth 2 (two) times daily.      insulin detemir 100 UNIT/ML injection   Commonly known as: LEVEMIR   Inject 27 Units into the skin daily.      NIFEdipine 90 MG 24 hr tablet   Commonly known as: PROCARDIA XL/ADALAT-CC   Take 90 mg by mouth  daily.      pioglitazone 30 MG tablet   Commonly known as: ACTOS   Take 30 mg by mouth daily.      rosuvastatin 20 MG tablet   Commonly known as: CRESTOR   Take 20 mg by mouth daily.      valsartan-hydrochlorothiazide 320-25 MG per tablet   Commonly known as: DIOVAN-HCT   Take 1 tablet by mouth daily.         ASK your doctor about these medications         insulin lispro 100 UNIT/ML injection   Commonly known as: HUMALOG   Inject 6 Units into the skin. 6 units for blood sugar readings greater than 300.           patient to be seen by ortho next week and me in 4 weeks.  Advised to see md for f/u of diabetes  Signed: Rudine Rieger M 01/05/2012, 10:12 AM

## 2012-01-05 NOTE — Progress Notes (Signed)
   CARE MANAGEMENT NOTE 01/05/2012  Patient:  [name], [name]   Account Number:  [id number]  Date Initiated:  01/01/2012  Documentation initiated by:  Ricki Miller  Subjective/Objective Assessment:   61 yr old female s/p decompression Laminectomy     Action/Plan:   Discharge planning.  Met with pt who has RW and 3:1 pt declined HHPT.   Anticipated DC Date:  01/05/2012   Anticipated DC Plan:  [place]  CM consult      Choice offered to / List presented to:          [facility] arranged  [place] - 11 Patient Refused      Status of service:  Completed, signed off Medicare Important Message given?   (If response is "NO", the following Medicare IM given date fields will be blank) Date Medicare IM given:   Date Additional Medicare IM given:    Discharge Disposition:  HOME/SELF CARE  Per UR Regulation:    Comments:  01/05/2012 Met with pt who continues to decline HHPT and has DME at home, no further needs. CRoyal RN MPH [id number]  01/01/12 [place], RN BSN Case Manager Case Manager spoke with patient. She states she doesnt want Home Health therapy as it isnt completely covered by BC/BS. States she had therapy with previous surgery and has an Mudlogger, doesnt want to create another. States her family will help her as needed, she will borrow a shower seat. Informed charge nurse of this conversation.

## 2012-01-05 NOTE — Progress Notes (Signed)
Met with pt who states that she has rolling walker and 3:1 commode, pt declines HHPT as she is ambulatory and has had HHPT previous and does not feel that she would benefit from this at this time. Additionally her insurance did not cover the HHPT after her last surgery.  Jasmine Pang RN MPH Case Manager [phone]

## 2012-01-09 MED FILL — Heparin Sodium (Porcine) Inj 1000 Unit/ML: INTRAMUSCULAR | Qty: 30 | Status: AC

## 2012-01-09 MED FILL — Sodium Chloride IV Soln 0.9%: INTRAVENOUS | Qty: 1000 | Status: AC

## 2012-01-09 MED FILL — Sodium Chloride Irrigation Soln 0.9%: Qty: 3000 | Status: AC

## 2012-01-22 ENCOUNTER — Other Ambulatory Visit (HOSPITAL_COMMUNITY): Payer: Self-pay | Admitting: Nephrology

## 2012-01-22 DIAGNOSIS — N289 Disorder of kidney and ureter, unspecified: Secondary | ICD-10-CM

## 2012-01-25 ENCOUNTER — Ambulatory Visit (HOSPITAL_COMMUNITY)
Admission: RE | Admit: 2012-01-25 | Discharge: 2012-01-25 | Disposition: A | Discharge status: Home / Self Care | Payer: BC Managed Care – PPO | Source: Ambulatory Visit | Attending: Nephrology | Admitting: Nephrology

## 2012-01-25 DIAGNOSIS — N189 Chronic kidney disease, unspecified: Secondary | ICD-10-CM | POA: Insufficient documentation

## 2012-01-25 DIAGNOSIS — N289 Disorder of kidney and ureter, unspecified: Secondary | ICD-10-CM

## 2012-02-01 ENCOUNTER — Other Ambulatory Visit: Payer: Self-pay | Admitting: Nephrology

## 2012-02-01 DIAGNOSIS — N281 Cyst of kidney, acquired: Secondary | ICD-10-CM

## 2012-02-02 ENCOUNTER — Ambulatory Visit (INDEPENDENT_AMBULATORY_CARE_PROVIDER_SITE_OTHER): Payer: BC Managed Care – PPO | Admitting: Vascular Surgery

## 2012-02-02 DIAGNOSIS — I701 Atherosclerosis of renal artery: Secondary | ICD-10-CM

## 2012-02-06 ENCOUNTER — Ambulatory Visit
Admission: RE | Admit: 2012-02-06 | Discharge: 2012-02-06 | Disposition: A | Discharge status: Home / Self Care | Payer: BC Managed Care – PPO | Source: Ambulatory Visit | Attending: Nephrology | Admitting: Nephrology

## 2012-02-06 DIAGNOSIS — N281 Cyst of kidney, acquired: Secondary | ICD-10-CM

## 2012-02-15 ENCOUNTER — Telehealth (HOSPITAL_COMMUNITY): Payer: Self-pay | Admitting: Dietician

## 2012-02-15 NOTE — Telephone Encounter (Signed)
Appointment scheduled for 02/19/12 at 11:00 AM.

## 2012-02-15 NOTE — Telephone Encounter (Signed)
Received referral from Dr. Jacelyn Grip ([place]) for dx: diabetes on 02/15/2012.

## 2012-02-15 NOTE — Procedures (Unsigned)
RENAL ARTERY DUPLEX EVALUATION  INDICATION:  Renal artery stenosis.  HISTORY: Diabetes:  Yes. Cardiac:  No. Hypertension:  Yes. Smoking:  Previous.  RENAL ARTERY DUPLEX FINDINGS: Aorta-Proximal:  99 cm/s Aorta-Mid:  92 cm/s Aorta-Distal:  N/V cm/s Celiac Artery Origin:  138 cm/s SMA Origin:  181 cm/s                                   RIGHT               LEFT Renal Artery Origin:             99 cm/s             90 cm/s Renal Artery Proximal:           91 cm/s             93 cm/s Renal Artery Mid:                143 cm/s            175cm/s Renal Artery Distal:             85 cm/s             162 cm/s Hilar Acceleration Time (AT): Renal-Aortic Ratio (RAR):        1.44                1.77 Kidney Size:                     10.9 cm             99991111 cm End Diastolic Ratio (EDR): Resistive Index (RI):            0.80 UP/0.81 LP     0.89 UP/0.67 LP  IMPRESSION: 1. Patent bilateral renal arteries with renal aorta ratio of less than     3.5, suggesting stenosis less than 60%. 2. No significant plaque visualized; however, due to size of vessel     this is technically difficult. 3. Resistive indices are abnormally elevated and greater than 0.70.  ___________________________________________ Rosetta Posner, M.D.  SH/MEDQ  D:  02/02/2012  T:  02/02/2012  Job:  [id number]

## 2012-02-19 ENCOUNTER — Encounter (HOSPITAL_COMMUNITY): Payer: Self-pay | Admitting: Dietician

## 2012-02-19 NOTE — Progress Notes (Signed)
Outpatient Initial Nutrition Assessment  Date:02/19/2012   Time: 11:00 AM  Referring Physician: Dr. Jacelyn Grip ([place]) Reason for Visit: diabetes  Nutrition Assessment:  Ht: Height: 5\' 5"  (165.1 cm)   Wt:Weight: 216 lb (97.977 kg)   IBW: 125# %IBW: 173% UBW: 180 %UBW: 120% BMI: Body mass index is 35.94 kg/(m^2).  Goal Weight: 160# (per pt) Weight hx: Pt reports she was able to maintain a weight of 180# for several years, up until February 2011, when she had back surgery and neck surgery. Her lowest weight was 44 years ago, 120-125# at age 33.   Estimated nutritional needs: 1785-1947 kcals daily, 79-98 grams protein daily, 1.8-1.9 L fluid daily  PMH:  Past Medical History  Diagnosis Date  . Esophageal stricture   . Hyperlipidemia   . Hypertension   . Bronchitis   . CAD (coronary artery disease)     Stenting in 2005. 2009 cardiac cath. Demonstrated questionable mitral regurgitation. Left main was free of critical disease, the LAD had mild plaquing, circumflex had some mild luminal irregularities, there is ramus intermediate with 40% stenosis prior to stent. The stent itself was widely patent, the RCA had 40% proximal stenosis.  . Arthritis   . Chronic kidney disease     CREATININE IS UP--GOING TO KIDNEY MD   . Non-insulin dependent diabetes mellitus     Medications:  Current Outpatient Rx  Name Route Sig Dispense Refill  . ASPIRIN 81 MG PO TBEC Oral Take 81 mg by mouth daily.      [name] GABAPENTIN 300 MG PO CAPS Oral Take 300 mg by mouth 3 (three) times daily.     [name] GLIMEPIRIDE 4 MG PO TABS Oral Take 4 mg by mouth 2 (two) times daily.     . INSULIN DETEMIR 100 UNIT/ML [place] SOLN Subcutaneous Inject 27 Units into the skin daily.     [name] NIFEDIPINE ER OSMOTIC 90 MG PO TB24 Oral Take 90 mg by mouth daily.    [name] PIOGLITAZONE HCL 30 MG PO TABS Oral Take 30 mg by mouth daily.      [name] ROSUVASTATIN CALCIUM 20 MG PO TABS Oral Take 20 mg by mouth daily.      [name]  VALSARTAN-HYDROCHLOROTHIAZIDE 320-25 MG PO TABS Oral Take 1 tablet by mouth daily.        Labs: CMP     Component Value Date/Time   NA 135 01/02/2012 1624   K 4.2 01/02/2012 1624   CL 98 01/02/2012 1624   CO2 28 01/02/2012 1624   GLUCOSE 124* 01/02/2012 1624   BUN 39* 01/02/2012 1624   CREATININE 1.68* 01/02/2012 1624   CALCIUM 9.0 01/02/2012 1624   PROT 6.7 12/25/2011 1312   ALBUMIN 3.2* 12/25/2011 1312   AST 13 12/25/2011 1312   ALT 17 12/25/2011 1312   ALKPHOS 78 12/25/2011 1312   BILITOT 0.2* 12/25/2011 1312   GFRNONAA 32* 01/02/2012 1624   GFRAA 37* 01/02/2012 1624    Lipid Panel     Component Value Date/Time   CHOL  Value: 173        ATP III CLASSIFICATION:  <200     mg/dL   Desirable  200-239  mg/dL   Borderline High  >=240    mg/dL   High 06/17/2008 0625   TRIG 95 06/17/2008 0625   HDL 57 06/17/2008 0625   CHOLHDL 3.0 06/17/2008 0625   VLDL 19 06/17/2008 0625   LDLCALC  Value: 97  Total Cholesterol/HDL:CHD Risk Coronary Heart Disease Risk Table                     Men   Women  1/2 Average Risk   3.4   3.3 06/17/2008 [id number]     Lab Results  Component Value Date   HGBA1C 6.8* 01/02/2012   HGBA1C  Value: 7.0 (NOTE)   The ADA recommends the following therapeutic goal for glycemic   control related to Hgb A1C measurement:   Goal of Therapy:   < 7.0% Hgb A1C   Reference: American Diabetes Association: Clinical Practice   Recommendations 2008, Diabetes Care,  2008, 31:(Suppl 1).* 06/17/2008   Lab Results  Component Value Date   LDLCALC  Value: 97        Total Cholesterol/HDL:CHD Risk Coronary Heart Disease Risk Table                     Men   Women  1/2 Average Risk   3.4   3.3 06/17/2008   CREATININE 1.68* 01/02/2012     Lifestyle/ social habits: Ms. Malloch lives alone in [place], [place]. She has two adult daughters and 2 grandsons, who live in [place]. Her parents live next door. She is on disability due to neck and back surgeries; she worked in [employer] for 36 years. She reports  her stress level at 2-3, citing her health issues as her main source of stress.   Nutrition hx/habits: Ms. Audley reports that she has no eating schedule. She eats out 1-2 times daily. She disclosed that she ends out throwing a lot of groceries away because the food passes its expiration date before she uses it. She eats in front of the television and reports snacking because she is "bored". She reports that her CBGs were running in the 190-200's prior to her taking her Insulin at night; now that she takes her Insulin at night she reports her glucose levels are between 97-103 (AM fasting).  She does not participate in any physical activity. She reports that she has cut back on bread and is drinking more water. She has motivation to "eat better". She reports she was able to lower her Hgb A1c to 6.9 from 7.4 in the past 3 months, but desires to reduce her medications.   Diet recall:  Breakfast: 2 eggs, 2 sausages, 1 slice of bread; Lunch: chicken, McDonald[employer] Burger, fries, Dinner: salad and spaghetti. Pt reports snacking throughout the day on apples, oranges, and grapes.   Nutrition Diagnosis:  Inconsistent carbohydrate intake r/t disordered eating patterns AEB excessive snacking, diet high in refined carbohydrates.   Nutrition Intervention: Nutrition rx: 1500 kcal NAS, diabetic diet; 3 meals per day (4-5 hours apart); limit 1 starch per meal; low calorie beverages only; physical activity as tolerated  Education/Counseling Provided: Educated pt on diabetic diet principles. Emphasized plate method, sources of carbohydrate, and portion sizes. Discussed importance of regular physical activity to assist with optimal glycemic control and weight management. Spent most of the visit counseling on healthier food choices and meal planning. Discussed importance of self-monitoring and keeping a food diary. Showed pt functionality of https://www.matthews.info/. Provided plate method, managing your diabetes, carb counting and meal  planning, and diabetes and you handouts. Also provided handouts from the Academy of Nutrition and Dietetics re: diabetes. Also provided blood sugar diary.  Understanding, Motivation, Ability to Follow Recommendations: Expect fair to good compliance.  Monitoring and Evaluation: Goals: 1) 1-2# weight loss per week; 2)  Physical activity as tolerated; 3) 3 meals per day; 4) Hgb A1c < 7.0  Recommendations: 1) For weight loss: 1285-1447 kcals daily; 2) Plan weekly meals and use to prepare grocery list for the week;; 3) Do not eat in front of television or computer; 4) Keep food diary (ex. MyFitnessPal); 5) Break up exercise into smaller, more frequent sessions; 6) Consider gym membership for water exercises  F/U: 6 weeks  Alene Mires, RD  02/19/2012  Time: 11:00 AM

## 2012-02-22 ENCOUNTER — Ambulatory Visit: Payer: BC Managed Care – PPO | Attending: Neurosurgery | Admitting: Physical Therapy

## 2012-02-22 DIAGNOSIS — M545 Low back pain, unspecified: Secondary | ICD-10-CM | POA: Insufficient documentation

## 2012-02-22 DIAGNOSIS — R5381 Other malaise: Secondary | ICD-10-CM | POA: Insufficient documentation

## 2012-02-22 DIAGNOSIS — IMO0001 Reserved for inherently not codable concepts without codable children: Secondary | ICD-10-CM | POA: Insufficient documentation

## 2012-02-23 ENCOUNTER — Ambulatory Visit: Payer: BC Managed Care – PPO | Admitting: *Deleted

## 2012-02-26 ENCOUNTER — Ambulatory Visit: Payer: BC Managed Care – PPO | Admitting: Physical Therapy

## 2012-02-28 ENCOUNTER — Encounter: Payer: BC Managed Care – PPO | Admitting: Physical Therapy

## 2012-02-28 ENCOUNTER — Ambulatory Visit: Payer: BC Managed Care – PPO | Admitting: Physical Therapy

## 2012-02-29 ENCOUNTER — Ambulatory Visit: Payer: BC Managed Care – PPO | Admitting: *Deleted

## 2012-03-04 ENCOUNTER — Ambulatory Visit: Payer: BC Managed Care – PPO | Attending: Neurosurgery | Admitting: Physical Therapy

## 2012-03-04 DIAGNOSIS — IMO0001 Reserved for inherently not codable concepts without codable children: Secondary | ICD-10-CM | POA: Insufficient documentation

## 2012-03-04 DIAGNOSIS — M545 Low back pain, unspecified: Secondary | ICD-10-CM | POA: Insufficient documentation

## 2012-03-04 DIAGNOSIS — R5381 Other malaise: Secondary | ICD-10-CM | POA: Insufficient documentation

## 2012-03-06 ENCOUNTER — Ambulatory Visit: Payer: BC Managed Care – PPO | Admitting: Physical Therapy

## 2012-03-08 ENCOUNTER — Ambulatory Visit: Payer: BC Managed Care – PPO | Admitting: *Deleted

## 2012-03-11 ENCOUNTER — Ambulatory Visit: Payer: BC Managed Care – PPO | Admitting: Physical Therapy

## 2012-03-13 ENCOUNTER — Ambulatory Visit: Payer: BC Managed Care – PPO | Admitting: Physical Therapy

## 2012-03-15 ENCOUNTER — Encounter: Payer: BC Managed Care – PPO | Admitting: Physical Therapy

## 2012-03-19 ENCOUNTER — Ambulatory Visit: Payer: BC Managed Care – PPO | Admitting: *Deleted

## 2012-03-21 ENCOUNTER — Ambulatory Visit: Payer: BC Managed Care – PPO | Admitting: *Deleted

## 2012-03-28 ENCOUNTER — Telehealth (HOSPITAL_COMMUNITY): Payer: Self-pay | Admitting: Dietician

## 2012-03-28 NOTE — Telephone Encounter (Signed)
Mailed appointment letter to pt home via [place] Mail for Monday, April 29th at 11:00 AM.

## 2012-04-01 ENCOUNTER — Encounter (HOSPITAL_COMMUNITY): Payer: Self-pay | Admitting: Dietician

## 2012-04-01 NOTE — Progress Notes (Signed)
Follow-Up Outpatient Nutrition Note Date: 04/01/2012  Time: 11:30 AM  Nutrition Assessment:  Current weight: Weight: 214 lb (97.07 kg)  BMI: Body mass index is 35.61 kg/(m^2).  Weight changes: -2# (1%) x 1 month  Pt arrived 30 minutes late to her appointment. She reports she saw Dr. Jacelyn Grip last week and he was pleased with her progress. She reports Dr. Jacelyn Grip told her that she no longer has to go to the endocrinologist. She is concerned about her weight fluctuations, reporting she was 211# last week. She blames her medications for her weight gain. She denies changes to medication regimen since last visit, but reports noncompliance with medications upon further questioning. She reports blood pressures are "OK", reporting systolic in the [id number] on most days. She reports noncompliance with CBGs. She reports that she always checks fasting (runs around 90-100's per pt report), but runs anywhere from 150-300's postprandial. She reports she does not check postprandial glucose levels often; she reports is it painful for her to check her blood sugars several times a day. She admits to noncompliance with diet, but is "trying to get on a schedule". She admits to sitting and eating because she is bored. She does not like keeping a food diary.  She also reports that she wants to continue to try to improve her lifestyle even though it is difficult for her to implement changes because she doesn't want further assistance with her diabetes and is hopeful that she can reduce her medication dosages with lifestyle changes. She is requesting educational materials on diabetes medicines.   She recently completed physical therapy.   Labs: No new labs. Pt reports Hgb A1c to be checked in June at her next appointment at Dr. Jodi Mourning office.   Diet recall: Breakfast: egg OR cereal OR oatmeal, water or diet soda; Lunch: fast food sandwich or salad; Dinner: spaghetti and salad. She drinks mostly diet tea, diet soda, or water. She admits to  struggling with a consistent meal schedule.   Nutrition Diagnosis: Inconsistent carbohydrate intake continues.   Nutrition Intervention: Education/ counseling provided: Discussed importance of compliance with all treatments for diabetes to optimize glycemic control. Reviewed diabetic diet principles with pt. Discussed nutrition content of commonly eaten foods at fast food restaurants. Encouraged pt to order small or kid sized items from menus. Discussed ordering a salad instead of fries. Praised pt on beverage choices. Provided Nutrition in the [employer]. Also provided diabetes medicines handout, per pt request. Urged pt to discuss concerns with her medications with pharmacist and Dr. Jacelyn Grip.   Understanding/Motivation/ Ability to follow recommendations: Expect fair compliance.   Monitoring and Evaluation: Previous goals: 1) 1-2# weight loss per week- goal not met; 2) Physical activity as tolerated- goal not met; 3) 3 meals per day- goal met; 4) Hgb A1c < 7.0- labs pending  Goals for next visit: 1) 1-2# weight loss per week; 2) Physical activity as tolerated  Recommendations: 1) Plan weekly meals and use to prepare grocery list for the week; 2) Do not eat in front of television or computer; 3) Keep food diary; 4) Break exercise up into smaller, more frequent sessions; 6) Consider gym membership for water exercises; 7) Follow-up with Dr. Jacelyn Grip and pharmacist to discuss concerns with medication regimen  F/U: 1 month  [name], RD, LDN Date:04/01/2012 Time: 11:30 AM

## 2012-04-18 ENCOUNTER — Telehealth (HOSPITAL_COMMUNITY): Payer: Self-pay | Admitting: Dietician

## 2012-04-18 NOTE — Telephone Encounter (Signed)
Mailed appointment confirmation letter for 04/22/12 appointment at 11:00 AM to pt home via [place] Mail.

## 2012-04-22 ENCOUNTER — Encounter (HOSPITAL_COMMUNITY): Payer: Self-pay | Admitting: Dietician

## 2012-04-22 NOTE — Progress Notes (Signed)
Follow-Up Outpatient Nutrition Note Date: 04/22/2012  Time: 11:00 AM  Nutrition Assessment:  Current weight: Weight: 215 lb (97.523 kg)  BMI: Body mass index is 35.78 kg/(m^2).  Weight changes: +1# (0.5%) x 1 month  [name] has multiple complaints today, including fluid retention, swelling, and dizziness. She is "disgusted" with her weight. SHe reports no changes in the way her clothing fits; she would like to be able to wear her "old clothes". She reports that her weight fluctuates, mainly due to fluid retention. She reports that when she does weight twice a day, she can see as large of a difference of 5# from the AM to PM. She also has multiple complaints regarding her diabetes medications; she desires her Insulin to be discontinued, because she believes that most of her medications are the cause of her weight gain. CBGs, per pt report, are between 70-80's fasting and 170-180's postprandial. She reports her fasting glucose was 54 this AM. Pt checked her glucose during visit; reading was 170. Pt reported she ate about 1 hour ago.  She reports she went to her nephrologist this month and "everything is ok". She reports that she has been trying to "cut back on bread, potatoes, and eating out". She expresses frustration with preparing meals for only one person. She admits to eat large portions while eating in front of her TV. She started going to the gym; she went 2 times a week last week. She desires to work up to 3 times a week to "ger my money's worth". She uses the NuStep, treadmill, and eliptical for about 30 minutes.   Labs: No new labs. Pt reports Hgb A1c to be checked in June at her next appointment at Dr. Jodi Mourning office.   Diet recall: Breakfast: piece of bread, sausage, orange juice, egg; Lunch: fast food sandwich and salad; Dinner: prepared salad from [place] with [place], ham, cheese, lettuce, tomato, and ranch dressing. She drinks mostly diet tea, diet soda, or water. She admits to struggling  with a consistent meal schedule.   Nutrition Diagnosis: Inconsistent carbohydrate intake continues.   Nutrition Intervention: Education/ counseling provided: Praised pt for progress made. Discussed importance of regular physical activity with good diet to assist with weight loss. Discussed importance of portion control, especially with fruits. Discussed importance of not eating in front of the television. Discussed breaking exercise up into smaller, more frequent sessions. Urged pt again to discuss concerns with her medications with pharmacist and Dr. Jacelyn Grip at upcoming appointment.   Understanding/Motivation/ Ability to follow recommendations: Expect fair compliance.   Monitoring and Evaluation: Previous goals: 1) 1-2# weight loss per week- goal not met; 2) Physical activity as tolerated- goal met  Goals for next visit: 1) 1-2# weight loss per week; 2) 30 minutes physical activity 3 times per week  Recommendations: 1) Plan weekly meals and use to prepare grocery list for the week; 2) Do not eat in front of television or computer; 3) Keep food diary; 4) Break exercise up into smaller, more frequent sessions; 6) Consider gym membership for water exercises; 7) Follow-up with Dr. Jacelyn Grip and pharmacist to discuss concerns with medication regimen  F/U: 1 month  Shaliyah Taite A. Kayan, RD, LDN Date:04/22/2012 Time: 11:00 AM

## 2012-05-14 ENCOUNTER — Telehealth (HOSPITAL_COMMUNITY): Payer: Self-pay | Admitting: Dietician

## 2012-05-14 NOTE — Telephone Encounter (Signed)
Sent appointment verification letter and instructions via [place] Mail for appointment scheduled for 05/20/12 at 11:00 AM.

## 2012-05-20 ENCOUNTER — Encounter (HOSPITAL_COMMUNITY): Payer: Self-pay | Admitting: Dietician

## 2012-05-20 NOTE — Progress Notes (Signed)
Follow-Up Outpatient Nutrition Note Date: 05/20/2012  Time: 11:00 AM  Nutrition Assessment:  Current weight: Weight: 210 lb (95.255 kg)  BMI: Body mass index is 34.95 kg/(m^2).  Weight changes: -5# (2.3%) x 1 month  Ms. Pedigo is pleased that she lost weight, but expressed frustration that weight is "coming off too slow" and she is not seeing the results she wants. She reports that she still has her membership to use the exercise machines at the fitness center, but does not know if her membership can be extended past this month. She is considering joining the [facility] in [place] if she is unable to continue her membership at the rehab center, but is afraid to "pay money if I'm not sure I'll go". She reports that she has been tapering her exercise off lately, due to leg pain. Prior to that, she was going 3 days a week, using the NuStep for 30 minutes straight and then using the treadmill and elliptical for 5-10 minutes apiece. She reports that long, continuous exercise makes her tired and causes her legs to "give out".  She reports better control with her blood sugars. She reports fasting levels in the 70-100's. She reports postprandial readings in the 160's, although admits to not checking postprandials daily ("I feel like a pin cushion"). She reports Dr. Jacelyn Grip would like to see her A1c at 6.4. She desires to reduce the doses of her medication and discontinue her Insulin. She is concerned about the side effects of Actos. She also complains that her Insulin is causing her to gain weight and she has bruising on her stomach from injections.   Labs: Pt reports Hgb A1c: 6.8 on 05/14/12 (last visit with Dr. Jacelyn Grip, PCP).   Diet recall: Breakfast: piece of bread, sausage, orange juice, egg; Lunch: fast food sandwich and salad; Dinner: prepared salad from [place] with [place], ham, cheese, lettuce, tomato, and ranch dressing. She drinks mostly diet tea, diet soda, or water. She admits to struggling with a consistent meal  schedule.   Nutrition Diagnosis: Inconsistent carbohydrate intake continues.   Nutrition Intervention: Education/ counseling provided: Counseled pt on slow, moderate weight loss of 1-2# per week; discussed that pt is on target with goal weight loss with weight loss this month. Provided pt with encouragement to reach weight loss goals. Also counseled on regular physical activity and more frequent blood glucose monitoring (checking postprandial readings) will assist in optimal glycemic control. Discussed exercise ideas; encouraged pt not to overexert herself and to break up exercise into smaller, more frequent sessions to help with stamina.   Understanding/Motivation/ Ability to follow recommendations: Expect fair compliance.   Monitoring and Evaluation: Previous goals:  1) 1-2# weight loss per week- goal met; 2) 30 minutes physical activity 3 times per week- progressing  Goals for next visit:  1) 1-2# weight loss per week; 2) 30 minutes physical activity 3 times per week  Recommendations: 1) Break exercise up into smaller, more frequent sessions (ex. 15 minutes twice a day)  F/U: 6 weeks. Follow-up appointment scheduled for 07/01/12 at 11:00 AM.  Joaquim Lai, RD, LDN Date:05/20/2012 Time: 11:00 AM

## 2012-06-20 ENCOUNTER — Telehealth (HOSPITAL_COMMUNITY): Payer: Self-pay | Admitting: Dietician

## 2012-06-20 NOTE — Telephone Encounter (Signed)
Mailed appointment confirmation letter and instructions for appointment scheduled 07/01/12 at 11:00 AM via [place] Mail.

## 2012-07-01 ENCOUNTER — Telehealth (HOSPITAL_COMMUNITY): Payer: Self-pay | Admitting: Dietician

## 2012-07-01 NOTE — Telephone Encounter (Signed)
Pt unable to keep appointment due to back trouble. Appointment rescheduled for 07/10/12 at 11:00 AM.

## 2012-07-01 NOTE — Telephone Encounter (Signed)
Mailed appointment confirmation letter and instructions for appointment scheduled 07/10/12 at 11:00 AM via [place] Mail.

## 2012-07-10 ENCOUNTER — Encounter (HOSPITAL_COMMUNITY): Payer: Self-pay | Admitting: Dietician

## 2012-07-10 NOTE — Progress Notes (Signed)
Follow-Up Outpatient Nutrition Note Date: 07/10/2012  Appt EndTime: 11:00 AM  Nutrition Assessment:  Current weight: Weight: 215 lb (97.523 kg)  BMI: Body mass index is 35.78 kg/(m^2).  Weight changes: +5# (2.3%) x 1 month  Ms. Hector reports frustration with her diet and weight gain. She reports that her weight has gone "up and down" this past month, which she attributes to fluid retention. She reports that she has gone to the gym twice a week, down from her usual 3 times per week. She complains of arm and shoulder pain, for which she reports the will be going to PT for evaluation and treatment. She is determined to lose weight to help with control of her chronic conditions and would like to reduce the dosages of her medications. She is very frustrated with her lack of progress. She reports confusion about her diet, stating that monitoring fat, cholesterol, carbohydrate, and sodium content in foods is "too much" for her. She reports that Dr. Jacelyn Grip suggested she read "The Engine 2 Diet" by Christa See. She also read "Cure Diabetes In 30 Days". She reports dissatisfaction on the plans outlined in these books, as she does not have a desire to become vegetarian and due to limited availability of specialty food products that are suggested in these books. She reports that she is determined to eat more vegetables.  She has not used MyFitnessPal yet, but expresses interest in trying it.   Labs: Pt reports Hgb A1c: 6.8 on 06/03/12 (last visit with Dr. Jacelyn Grip, PCP).   Diet recall: Breakfast: scrambled eggs; Lunch: fast food sandwich and salad; Dinner: prepared salad from [employer] with [place], ham, cheese, lettuce, tomato, and ranch dressing. She drinks mostly diet tea, diet soda, or water. She admits to struggling with a consistent meal schedule.   Nutrition Diagnosis: Inconsistent carbohydrate intake continues.   Nutrition Intervention: Education/ counseling provided: The majority of the visit was spent on  counseling and education. Reviewed the plate method with patient. Educated pt on healthy food preparation methods and ways to incorporate fruits, vegetables, whole grains, lean proteins, and low fat dairy into diet. Discussed portion control. Educated pt on carbohydrate counting- recommended 45-60 grams carbohydrate per meal. Encouraged pt to create structured meal schedule and exercise plan. Provided plate method handout and provided 2 diabetic cookbooks (from [employer]).   Understanding/Motivation/ Ability to follow recommendations: Expect fair compliance.   Monitoring and Evaluation: Previous goals:  1) 1-2# weight loss per week- goal not met; 2) 30 minutes physical activity 3 times per week- goal not met  Goals for next visit:  1) 1-2# weight loss per week; 2) 30 minutes physical activity 3 times per week  Recommendations: 1) Limit 1 starch per meal; 2) Cut up and use fresh produce within a few days of purchase; 3) Make large salad to use throughout the week; 4) Remove skin and visible fat from meats and choose lean cuts of meat; 5) Use crockpot to prepare meals  F/U: 6-8 weeks. Follow-up appointment scheduled for 09/01/12 at 11:00 AM.  Joaquim Lai, RD, LDN Date:07/10/2012 Appt End Time: 12:00 PM

## 2012-07-11 ENCOUNTER — Ambulatory Visit: Payer: BC Managed Care – PPO | Attending: Orthopedic Surgery | Admitting: Physical Therapy

## 2012-07-11 DIAGNOSIS — M25519 Pain in unspecified shoulder: Secondary | ICD-10-CM | POA: Insufficient documentation

## 2012-07-11 DIAGNOSIS — R5381 Other malaise: Secondary | ICD-10-CM | POA: Insufficient documentation

## 2012-07-11 DIAGNOSIS — M6281 Muscle weakness (generalized): Secondary | ICD-10-CM | POA: Insufficient documentation

## 2012-07-11 DIAGNOSIS — M25619 Stiffness of unspecified shoulder, not elsewhere classified: Secondary | ICD-10-CM | POA: Insufficient documentation

## 2012-07-11 DIAGNOSIS — IMO0001 Reserved for inherently not codable concepts without codable children: Secondary | ICD-10-CM | POA: Insufficient documentation

## 2012-07-16 ENCOUNTER — Ambulatory Visit: Payer: BC Managed Care – PPO | Admitting: Physical Therapy

## 2012-07-18 ENCOUNTER — Ambulatory Visit: Payer: BC Managed Care – PPO | Admitting: *Deleted

## 2012-07-23 ENCOUNTER — Ambulatory Visit: Payer: BC Managed Care – PPO | Admitting: *Deleted

## 2012-07-25 ENCOUNTER — Ambulatory Visit: Payer: BC Managed Care – PPO | Admitting: *Deleted

## 2012-07-30 ENCOUNTER — Ambulatory Visit: Payer: BC Managed Care – PPO | Admitting: Physical Therapy

## 2012-08-01 ENCOUNTER — Ambulatory Visit: Payer: BC Managed Care – PPO | Admitting: Physical Therapy

## 2012-08-06 ENCOUNTER — Ambulatory Visit: Payer: BC Managed Care – PPO | Attending: Orthopedic Surgery | Admitting: *Deleted

## 2012-08-06 DIAGNOSIS — IMO0001 Reserved for inherently not codable concepts without codable children: Secondary | ICD-10-CM | POA: Insufficient documentation

## 2012-08-06 DIAGNOSIS — R5381 Other malaise: Secondary | ICD-10-CM | POA: Insufficient documentation

## 2012-08-06 DIAGNOSIS — M25619 Stiffness of unspecified shoulder, not elsewhere classified: Secondary | ICD-10-CM | POA: Insufficient documentation

## 2012-08-06 DIAGNOSIS — M25519 Pain in unspecified shoulder: Secondary | ICD-10-CM | POA: Insufficient documentation

## 2012-08-06 DIAGNOSIS — M6281 Muscle weakness (generalized): Secondary | ICD-10-CM | POA: Insufficient documentation

## 2012-08-08 ENCOUNTER — Ambulatory Visit: Payer: BC Managed Care – PPO | Admitting: *Deleted

## 2012-08-13 ENCOUNTER — Ambulatory Visit: Payer: BC Managed Care – PPO | Admitting: Physical Therapy

## 2012-08-15 ENCOUNTER — Ambulatory Visit: Payer: BC Managed Care – PPO | Admitting: Physical Therapy

## 2012-08-22 ENCOUNTER — Ambulatory Visit: Payer: BC Managed Care – PPO | Admitting: Physical Therapy

## 2012-08-27 ENCOUNTER — Ambulatory Visit: Payer: BC Managed Care – PPO | Admitting: Physical Therapy

## 2012-08-29 ENCOUNTER — Ambulatory Visit: Payer: BC Managed Care – PPO | Admitting: Physical Therapy

## 2012-09-02 ENCOUNTER — Telehealth (HOSPITAL_COMMUNITY): Payer: Self-pay | Admitting: Dietician

## 2012-09-02 NOTE — Telephone Encounter (Signed)
Returned pt's call to reschedule. Pt reported that she "didn't feel like going all the way out there today". Offered rescheduling appointment, but declined. She reports she had her appointment with Dr. Jacelyn Grip (PCP) earlier this month and her Hgb A1c has improved from 6.8 to 6.4. She is proud of her improvements and has no further concerns at this time.

## 2012-09-03 ENCOUNTER — Ambulatory Visit: Payer: BC Managed Care – PPO | Attending: Orthopedic Surgery | Admitting: Physical Therapy

## 2012-09-03 DIAGNOSIS — IMO0001 Reserved for inherently not codable concepts without codable children: Secondary | ICD-10-CM | POA: Insufficient documentation

## 2012-09-03 DIAGNOSIS — M25519 Pain in unspecified shoulder: Secondary | ICD-10-CM | POA: Insufficient documentation

## 2012-09-03 DIAGNOSIS — R5381 Other malaise: Secondary | ICD-10-CM | POA: Insufficient documentation

## 2012-09-03 DIAGNOSIS — M6281 Muscle weakness (generalized): Secondary | ICD-10-CM | POA: Insufficient documentation

## 2012-09-03 DIAGNOSIS — M25619 Stiffness of unspecified shoulder, not elsewhere classified: Secondary | ICD-10-CM | POA: Insufficient documentation

## 2012-09-05 ENCOUNTER — Ambulatory Visit: Payer: BC Managed Care – PPO | Admitting: Physical Therapy

## 2012-09-11 ENCOUNTER — Ambulatory Visit: Payer: BC Managed Care – PPO | Admitting: Physical Therapy

## 2012-09-23 ENCOUNTER — Other Ambulatory Visit: Payer: Self-pay | Admitting: Orthopedic Surgery

## 2012-09-23 DIAGNOSIS — M25512 Pain in left shoulder: Secondary | ICD-10-CM

## 2012-09-27 ENCOUNTER — Ambulatory Visit
Admission: RE | Admit: 2012-09-27 | Discharge: 2012-09-27 | Disposition: A | Discharge status: Home / Self Care | Payer: BC Managed Care – PPO | Source: Ambulatory Visit | Attending: Orthopedic Surgery | Admitting: Orthopedic Surgery

## 2012-09-27 DIAGNOSIS — M25512 Pain in left shoulder: Secondary | ICD-10-CM

## 2012-11-06 ENCOUNTER — Encounter: Payer: Self-pay | Admitting: Cardiology

## 2012-11-06 ENCOUNTER — Ambulatory Visit (INDEPENDENT_AMBULATORY_CARE_PROVIDER_SITE_OTHER): Payer: BC Managed Care – PPO | Admitting: Cardiology

## 2012-11-06 VITALS — BP 123/71 | HR 51 | Ht 64.0 in | Wt 226.0 lb

## 2012-11-06 DIAGNOSIS — E785 Hyperlipidemia, unspecified: Secondary | ICD-10-CM

## 2012-11-06 DIAGNOSIS — I2510 Atherosclerotic heart disease of native coronary artery without angina pectoris: Secondary | ICD-10-CM

## 2012-11-06 DIAGNOSIS — I10 Essential (primary) hypertension: Secondary | ICD-10-CM

## 2012-11-06 NOTE — Patient Instructions (Addendum)
The current medical regimen is effective;  continue present plan and medications.  Your physician has requested that you have a lexiscan myoview. For further information please visit [email or web address]. Please follow instruction sheet, as given.  Follow up in 1 year with Dr Percival Spanish.  You will receive a letter in the mail 2 months before you are due.  Please call us when you receive this letter to schedule your follow up appointment.

## 2012-11-06 NOTE — Progress Notes (Signed)
HPI The patient presents for follow up of CAD.  Since I last saw her she had back surgery. She is now being considered for rotator cuff surgery on the left. Unfortunately, she has gained about 10 pounds recently. She thinks this is related to being on insulin. He still somewhat limited by her back and leg pain. However, with her activity she is having more shortness of breath. She gets short of breath climbing up a slight incline to her mother's house. She's not describing PND or orthopnea. She's not been having any chest pressure, neck or arm discomfort. She has no palpitations, presyncope or syncope.  Allergies  Allergen Reactions  . Ace Inhibitors   . Clopidogrel Bisulfate     REACTION: Sick, Headache, "Felt terrible"  . Codeine     REACTION: hallucinations  . Penicillins     Current Outpatient Prescriptions  Medication Sig Dispense Refill  . aspirin 81 MG EC tablet Take 81 mg by mouth daily.        [name] gabapentin (NEURONTIN) 300 MG capsule Take 300 mg by mouth 3 (three) times daily.       [name] glimepiride (AMARYL) 4 MG tablet Take 4 mg by mouth 2 (two) times daily.       . insulin detemir (LEVEMIR) 100 UNIT/ML injection Inject 30 Units into the skin daily.       [name] NIFEdipine (PROCARDIA XL/ADALAT-CC) 90 MG 24 hr tablet Take 90 mg by mouth daily.      . pioglitazone (ACTOS) 30 MG tablet Take 30 mg by mouth daily.        . rosuvastatin (CRESTOR) 20 MG tablet Take 20 mg by mouth daily.        . valsartan-hydrochlorothiazide (DIOVAN-HCT) 320-25 MG per tablet Take 1 tablet by mouth daily.          Past Medical History  Diagnosis Date  . Esophageal stricture   . Hyperlipidemia   . Hypertension   . Bronchitis   . CAD (coronary artery disease)     Stenting in 2005. 2009 cardiac cath. Demonstrated questionable mitral regurgitation. Left main was free of critical disease, the LAD had mild plaquing, circumflex had some mild luminal irregularities, there is ramus intermediate with 40%  stenosis prior to stent. The stent itself was widely patent, the RCA had 40% proximal stenosis.  . Arthritis   . Chronic kidney disease     CREATININE IS UP--GOING TO KIDNEY MD   . Non-insulin dependent diabetes mellitus     Past Surgical History  Procedure Date  . Cardiac catheterization 2009  . Bladder surgery     Tack  . Partial hysterectomy   . Shoulder surgery     Right  . Cervical spine surgery   . Back surgery     2007 & 2012  . Abdominal hysterectomy   . Laminectomy 12/29/2011  . Cardiac stents   . Lumbar laminectomy/decompression microdiscectomy 12/29/2011    Procedure: LUMBAR LAMINECTOMY/DECOMPRESSION MICRODISCECTOMY;  Surgeon: Floyce Stakes, MD;  Location: [facility];  Service: Neurosurgery;  Laterality: N/A;  Lumbar Two through Lumbar Five Laminectomies/Cellsaver    ROS:  As stated in the HPI and negative for all other systems.  PHYSICAL EXAM BP 123/71  Pulse 51  Ht 5\' 4"  (1.626 m)  Wt 236 lb (107.049 kg)  BMI 40.51 kg/m2 GENERAL:  Well appearing HEENT:  Pupils equal round and reactive, fundi not visualized, oral mucosa unremarkable NECK:  No jugular venous distention, waveform within normal  limits, carotid upstroke brisk and symmetric, no bruits, no thyromegaly LYMPHATICS:  No cervical, inguinal adenopathy LUNGS:  Clear to auscultation bilaterally BACK:  No CVA tenderness CHEST:  Unremarkable HEART:  PMI not displaced or sustained,S1 and S2 within normal limits, no S3, no S4, no clicks, no rubs, no murmurs ABD:  Flat, positive bowel sounds normal in frequency in pitch, no bruits, no rebound, no guarding, no midline pulsatile mass, no hepatomegaly, no splenomegaly EXT:  2 plus pulses throughout, mild bilateral lower extremity edema, no cyanosis no clubbing SKIN:  No rashes no nodules, keloids NEURO:  Cranial nerves II through XII grossly intact, motor grossly intact throughout PSYCH:  Cognitively intact, oriented to person place and time  EKG:  Sinus  rhythm, rate 60, leftward axis, poor anterior R wave progression, no acute ST-T wave changes. No change from previous. 11/06/2012  ASSESSMENT AND PLAN  PREOP -  WEIGHT GAIN -  PREOP - Given the patient's increasing dyspnea with her history of coronary disease stress testing would be indicated prior to her upcoming elective surgery. However, she would not be a walk on a treadmill so she will have a [employer].  Further evaluation and recommendations for preop clearance will be based on this result and will be forwarded to Dr. Noemi Chapel.  Of note the request has been to hold her aspirin. This would be reasonable if necessary from a surgical standpoint that I would limit this to as few days as possible deferring to her orthopedist.  CAD -  This will be evaluated as above.   HYPERTENSION, BENIGN -  The blood pressure is at target. No change in medications is indicated. We will continue with therapeutic lifestyle changes (TLC).  DYSLIPIDEMIA - I did review recent labs and she still not at target. However, I will defer management to Dr. Jacelyn Grip who has been actively managing this.

## 2012-11-12 ENCOUNTER — Encounter (HOSPITAL_COMMUNITY): Payer: BC Managed Care – PPO

## 2012-11-14 ENCOUNTER — Encounter: Payer: Self-pay | Admitting: Cardiology

## 2012-11-18 ENCOUNTER — Ambulatory Visit (HOSPITAL_COMMUNITY): Payer: BC Managed Care – PPO | Attending: Cardiology | Admitting: Radiology

## 2012-11-18 VITALS — BP 140/68 | Ht 64.0 in | Wt 223.0 lb

## 2012-11-18 DIAGNOSIS — N189 Chronic kidney disease, unspecified: Secondary | ICD-10-CM | POA: Insufficient documentation

## 2012-11-18 DIAGNOSIS — I129 Hypertensive chronic kidney disease with stage 1 through stage 4 chronic kidney disease, or unspecified chronic kidney disease: Secondary | ICD-10-CM | POA: Insufficient documentation

## 2012-11-18 DIAGNOSIS — I999 Unspecified disorder of circulatory system: Secondary | ICD-10-CM | POA: Insufficient documentation

## 2012-11-18 DIAGNOSIS — E119 Type 2 diabetes mellitus without complications: Secondary | ICD-10-CM | POA: Insufficient documentation

## 2012-11-18 DIAGNOSIS — R0989 Other specified symptoms and signs involving the circulatory and respiratory systems: Secondary | ICD-10-CM | POA: Insufficient documentation

## 2012-11-18 DIAGNOSIS — I2510 Atherosclerotic heart disease of native coronary artery without angina pectoris: Secondary | ICD-10-CM | POA: Insufficient documentation

## 2012-11-18 DIAGNOSIS — R0602 Shortness of breath: Secondary | ICD-10-CM

## 2012-11-18 DIAGNOSIS — R0789 Other chest pain: Secondary | ICD-10-CM | POA: Insufficient documentation

## 2012-11-18 DIAGNOSIS — R0609 Other forms of dyspnea: Secondary | ICD-10-CM | POA: Insufficient documentation

## 2012-11-18 MED ORDER — TECHNETIUM TC 99M SESTAMIBI GENERIC - CARDIOLITE
33.0000 | Freq: Once | INTRAVENOUS | Status: AC | PRN
  Administered 2012-11-18: 33 via INTRAVENOUS

## 2012-11-18 MED ORDER — REGADENOSON 0.4 MG/5ML IV SOLN
0.4000 mg | Freq: Once | INTRAVENOUS | Status: AC
  Administered 2012-11-18: 0.4 mg via INTRAVENOUS

## 2012-11-18 MED ORDER — TECHNETIUM TC 99M SESTAMIBI GENERIC - CARDIOLITE
11.0000 | Freq: Once | INTRAVENOUS | Status: AC | PRN
  Administered 2012-11-18: 11 via INTRAVENOUS

## 2012-11-18 NOTE — Progress Notes (Signed)
[facility] [address] [place],  [address] [phone]    Cardiology Nuclear Med Study  [name] is a 61 y.o. female     MRN : [id number]     DOB: [birth year]  Procedure Date: 11/18/2012  Nuclear Med Background Indication for Stress Test:  Evaluation for Ischemia, Surgical Clearance and Stent Patency, Pre-OP rotator cuff History: '05 Stents: Ramus '09 Heart Cath: N/O CAD, patent stent, '10 ECHO: EF: 55-65%, '12 MPS: EF: 66% (-) ischemia, esophogeal stricture, CKD, Cardiac Risk Factors: History of Smoking, Hypertension, IDDM Type 2 and Lipids  Symptoms:  DOE;Fatigue;Chest tightness (last episode 11/13')   Nuclear Pre-Procedure Caffeine/Decaff Intake:  None NPO After: 10:00pm   Lungs:  clear O2 Sat: 97% on room air. IV 0.9% NS with Angio Cath:  22g  IV Site: L Antecubital  IV Started by:  Perrin Maltese, EMT-P  Chest Size (in):  44 Cup Size: D  Height: 5\' 4"  (1.626 m)  Weight:  223 lb (101.152 kg)  BMI:  Body mass index is 38.28 kg/(m^2). Tech Comments:  CBG 165 mg/dl    Nuclear Med Study 1 or 2 day study: 1 day  Stress Test Type:  Carlton Adam  Reading MD: Kirk Ruths, MD  Order Authorizing Provider:  J.Hochrein MD  Resting Radionuclide: Technetium 28m Sestamibi  Resting Radionuclide Dose: 11.0 mCi   Stress Radionuclide:  Technetium 27m Sestamibi  Stress Radionuclide Dose: 33.0 mCi           Stress Protocol Rest HR: 61 Stress HR: 79  Rest BP: 140/68 Stress BP: 149/53  Exercise Time (min): n/a METS: n/a   Predicted Max HR: 160 bpm % Max HR: 49.38 bpm Rate Pressure Product: 11771    Dose of Adenosine (mg):  n/a Dose of Lexiscan: 0.4 mg  Dose of Atropine (mg): n/a Dose of Dobutamine: n/a mcg/kg/min (at max HR)  Stress Test Technologist: Ileene Hutchinson, EMT-P  Nuclear Technologist:  Charlton Amor, CNMT     Rest Procedure:  Myocardial perfusion imaging was performed at rest 45 minutes following the intravenous  administration of Technetium 36m Sestamibi. Rest ECG: NSR, prior anterior infarct, nonspecific ST changes.  Stress Procedure:  The patient received IV Lexiscan 0.4 mg over 15-seconds.  Technetium 60m Sestamibi injected at 30-seconds.  Quantitative spect images were obtained after a 45 minute delay. Stress ECG: No significant ST segment change suggestive of ischemia.  QPS Raw Data Images:  Acquisition technically good; normal left ventricular size. Stress Images:  There is decreased uptake in the anterior wall. Rest Images:  Normal homogeneous uptake in all areas of the myocardium. Subtraction (SDS):  These findings are consistent with ischemia. Transient Ischemic Dilatation (Normal <1.22):  1.02 Lung/Heart Ratio (Normal <0.45):  0.40  Quantitative Gated Spect Images QGS EDV:  90 ml QGS ESV:  31 ml  Impression Exercise Capacity:  Lexiscan with no exercise. BP Response:  Normal blood pressure response. Clinical Symptoms:  There is dyspnea. ECG Impression:  No significant ST segment change suggestive of ischemia. Comparison with Prior Nuclear Study: No images to compare  Overall Impression:  Intermediate risk stress nuclear study with a medium size, medium intensity, reversible anterior defect consistent with mild to moderate anterior ischemia.  LV Ejection Fraction: 66%.  LV Wall Motion:  NL LV Function; NL Wall Motion  Kirk Ruths

## 2012-12-17 ENCOUNTER — Other Ambulatory Visit: Payer: Self-pay | Admitting: Urology

## 2012-12-17 DIAGNOSIS — N281 Cyst of kidney, acquired: Secondary | ICD-10-CM

## 2012-12-19 ENCOUNTER — Telehealth: Payer: Self-pay | Admitting: Cardiology

## 2012-12-19 NOTE — Telephone Encounter (Signed)
Pt aware this information has been sent to Dr Archie Endo office but will be sent again.

## 2012-12-19 NOTE — Telephone Encounter (Signed)
New Problem:    Patient called in needing her surgical clearance faxed to Dr. Alethia Berthold office.  Fax:[phone] Attn: Claiborne Billings or Sherri.

## 2012-12-25 ENCOUNTER — Telehealth: Payer: Self-pay | Admitting: Cardiology

## 2012-12-25 NOTE — Telephone Encounter (Signed)
This information has been sent at least twice already - will forward to MR to follow up as to why the results are not getting to [facility] at Dr Archie Endo office.

## 2012-12-25 NOTE — Telephone Encounter (Signed)
Sherry with dr Noemi Chapel office calling re status of surgical clearance from 11-06-12 appt, also had stress test 12-16, pls call asap

## 2012-12-26 ENCOUNTER — Telehealth: Payer: Self-pay | Admitting: *Deleted

## 2012-12-26 ENCOUNTER — Telehealth: Payer: Self-pay | Admitting: Cardiology

## 2012-12-26 NOTE — Telephone Encounter (Signed)
LOV, Stress faxed to [facility] @ [phone]

## 2012-12-26 NOTE — Telephone Encounter (Signed)
Called Spoke W/ Elmarie Shiley Let Her Know LOV,Stress faxed to [phone]  She Ok'd, 12/26/12/KM

## 2012-12-26 NOTE — Telephone Encounter (Signed)
Spoke with kristin she is the pa at dr [facility] office, they got the results of the myoview but the study is not normal and they want to make sure with dr hochrein that the pt is clear for surgery. Aware dr Percival Spanish is not here and will be able to call her back tomorrow. She agreed with this plan.

## 2012-12-27 NOTE — Telephone Encounter (Signed)
Per Elnita Maxwell - pt has not been scheduled for surgery as of yet. Awaiting clearance from MD.  States shoulder surgery should take 1 to 1 and 1/2 hours.  They need to know if pt should be 23 hr obs or if she can be an outpt.  [phone]

## 2012-12-27 NOTE — Telephone Encounter (Signed)
Will forward to MD to call Cyril Mourning to discuss

## 2013-01-07 NOTE — Telephone Encounter (Signed)
I have reviewed the patients stress test from Dec.  We compared this with the 2012.  There is hypoperfusion of the anterior wall.  However, this was felt to be breast attenuation in 2012 and it looks unchanged on the current scan.  Therefore, based on ACC/AHA guidelines, the patient would be at acceptable risk for the planned procedure without further cardiovascular testing.  This surgery could be done per any usual protocol.

## 2013-01-07 NOTE — Telephone Encounter (Signed)
Clearance given  Please see next phone note

## 2013-01-07 NOTE — Telephone Encounter (Signed)
Clearance faxed to [facility] at [phone]

## 2013-02-24 ENCOUNTER — Ambulatory Visit (HOSPITAL_COMMUNITY)
Admission: RE | Admit: 2013-02-24 | Discharge: 2013-02-24 | Disposition: A | Discharge status: Home / Self Care | Payer: BC Managed Care – PPO | Source: Ambulatory Visit | Attending: Urology | Admitting: Urology

## 2013-02-24 DIAGNOSIS — Q619 Cystic kidney disease, unspecified: Secondary | ICD-10-CM | POA: Insufficient documentation

## 2013-02-24 DIAGNOSIS — N281 Cyst of kidney, acquired: Secondary | ICD-10-CM

## 2013-03-07 ENCOUNTER — Ambulatory Visit (INDEPENDENT_AMBULATORY_CARE_PROVIDER_SITE_OTHER): Payer: BC Managed Care – PPO | Admitting: Urology

## 2013-03-07 DIAGNOSIS — N281 Cyst of kidney, acquired: Secondary | ICD-10-CM

## 2013-03-07 DIAGNOSIS — N393 Stress incontinence (female) (male): Secondary | ICD-10-CM

## 2013-03-07 DIAGNOSIS — R3911 Hesitancy of micturition: Secondary | ICD-10-CM

## 2013-03-12 ENCOUNTER — Encounter: Payer: Self-pay | Admitting: Family Medicine

## 2013-03-18 ENCOUNTER — Encounter: Payer: Self-pay | Admitting: Family Medicine

## 2013-03-25 ENCOUNTER — Other Ambulatory Visit (INDEPENDENT_AMBULATORY_CARE_PROVIDER_SITE_OTHER): Payer: BC Managed Care – PPO

## 2013-03-25 ENCOUNTER — Other Ambulatory Visit: Payer: Self-pay | Admitting: Family Medicine

## 2013-03-25 DIAGNOSIS — R5383 Other fatigue: Secondary | ICD-10-CM

## 2013-03-25 DIAGNOSIS — E785 Hyperlipidemia, unspecified: Secondary | ICD-10-CM

## 2013-03-25 DIAGNOSIS — R5381 Other malaise: Secondary | ICD-10-CM

## 2013-03-25 DIAGNOSIS — E559 Vitamin D deficiency, unspecified: Secondary | ICD-10-CM

## 2013-03-25 DIAGNOSIS — E1059 Type 1 diabetes mellitus with other circulatory complications: Secondary | ICD-10-CM

## 2013-03-25 DIAGNOSIS — I10 Essential (primary) hypertension: Secondary | ICD-10-CM

## 2013-03-25 LAB — POCT CBC
Granulocyte percent: 60.3 %G (ref 37–80)
HCT, POC: 40.8 % (ref 37.7–47.9)
Hemoglobin: 13.3 g/dL (ref 12.2–16.2)
Lymph, poc: 3 (ref 0.6–3.4)
MCH, POC: 27.4 pg (ref 27–31.2)
MCHC: 32.5 g/dL (ref 31.8–35.4)
MCV: 84.1 fL (ref 80–97)
MPV: 9.4 fL (ref 0–99.8)
POC Granulocyte: 5.1 (ref 2–6.9)
POC LYMPH PERCENT: 35.4 %L (ref 10–50)
Platelet Count, POC: 262 10*3/uL (ref 142–424)
RBC: 4.9 M/uL (ref 4.04–5.48)
RDW, POC: 15.8 %
WBC: 8.5 10*3/uL (ref 4.6–10.2)

## 2013-03-25 LAB — COMPREHENSIVE METABOLIC PANEL
ALT: 26 U/L (ref 0–35)
AST: 20 U/L (ref 0–37)
Albumin: 4 g/dL (ref 3.5–5.2)
Alkaline Phosphatase: 69 U/L (ref 39–117)
BUN: 32 mg/dL — ABNORMAL HIGH (ref 6–23)
CO2: 29 mEq/L (ref 19–32)
Calcium: 9.7 mg/dL (ref 8.4–10.5)
Chloride: 103 mEq/L (ref 96–112)
Creat: 1.66 mg/dL — ABNORMAL HIGH (ref 0.50–1.10)
Glucose, Bld: 159 mg/dL — ABNORMAL HIGH (ref 70–99)
Potassium: 3.7 mEq/L (ref 3.5–5.3)
Sodium: 144 mEq/L (ref 135–145)
Total Bilirubin: 0.3 mg/dL (ref 0.3–1.2)
Total Protein: 6.6 g/dL (ref 6.0–8.3)

## 2013-03-25 LAB — POCT GLYCOSYLATED HEMOGLOBIN (HGB A1C): Hemoglobin A1C: 7.9

## 2013-03-25 NOTE — Telephone Encounter (Signed)
No samples available-pt aware rx called to [facility].

## 2013-03-26 LAB — NMR LIPOPROFILE WITH LIPIDS
Cholesterol, Total: 241 mg/dL — ABNORMAL HIGH (ref <200)
HDL Particle Number: 34.9 umol/L (ref >=30.5)
HDL Size: 8.8 nm — ABNORMAL LOW (ref >=9.2)
HDL-C: 51 mg/dL (ref >=40)
LDL (calc): 148 mg/dL — ABNORMAL HIGH (ref <100)
LDL Particle Number: 1917 nmol/L — ABNORMAL HIGH (ref <1000)
LDL Size: 20 nm — ABNORMAL LOW (ref >20.5)
LP-IR Score: 64 — ABNORMAL HIGH (ref <=45)
Large HDL-P: 2.9 umol/L — ABNORMAL LOW (ref >=4.8)
Large VLDL-P: 4.9 nmol/L — ABNORMAL HIGH (ref <=2.7)
Small LDL Particle Number: 1124 nmol/L — ABNORMAL HIGH (ref <=527)
Triglycerides: 209 mg/dL — ABNORMAL HIGH (ref <150)
VLDL Size: 45.8 nm (ref <=46.6)

## 2013-03-26 LAB — VITAMIN D 25 HYDROXY (VIT D DEFICIENCY, FRACTURES): Vit D, 25-Hydroxy: 36 ng/mL (ref 30–89)

## 2013-03-28 MED ORDER — ROSUVASTATIN CALCIUM 20 MG PO TABS: 20.0000 mg | ORAL_TABLET | Freq: Every day | ORAL | Status: DC

## 2013-03-31 ENCOUNTER — Ambulatory Visit (INDEPENDENT_AMBULATORY_CARE_PROVIDER_SITE_OTHER): Payer: BC Managed Care – PPO | Admitting: Family Medicine

## 2013-03-31 ENCOUNTER — Encounter: Payer: Self-pay | Admitting: Family Medicine

## 2013-03-31 VITALS — BP 132/82 | HR 62 | Temp 97.4°F | Wt 219.4 lb

## 2013-03-31 DIAGNOSIS — I10 Essential (primary) hypertension: Secondary | ICD-10-CM

## 2013-03-31 DIAGNOSIS — M19012 Primary osteoarthritis, left shoulder: Secondary | ICD-10-CM

## 2013-03-31 DIAGNOSIS — E119 Type 2 diabetes mellitus without complications: Secondary | ICD-10-CM

## 2013-03-31 DIAGNOSIS — E1149 Type 2 diabetes mellitus with other diabetic neurological complication: Secondary | ICD-10-CM

## 2013-03-31 DIAGNOSIS — I2510 Atherosclerotic heart disease of native coronary artery without angina pectoris: Secondary | ICD-10-CM

## 2013-03-31 DIAGNOSIS — E1140 Type 2 diabetes mellitus with diabetic neuropathy, unspecified: Secondary | ICD-10-CM

## 2013-03-31 DIAGNOSIS — E785 Hyperlipidemia, unspecified: Secondary | ICD-10-CM

## 2013-03-31 DIAGNOSIS — E1142 Type 2 diabetes mellitus with diabetic polyneuropathy: Secondary | ICD-10-CM

## 2013-03-31 DIAGNOSIS — E559 Vitamin D deficiency, unspecified: Secondary | ICD-10-CM

## 2013-03-31 DIAGNOSIS — N189 Chronic kidney disease, unspecified: Secondary | ICD-10-CM

## 2013-03-31 DIAGNOSIS — M19019 Primary osteoarthritis, unspecified shoulder: Secondary | ICD-10-CM

## 2013-03-31 NOTE — Progress Notes (Signed)
Patient ID: [name], female   DOB: 02-Nov-1951, 62 y.o.   MRN: [id number] SUBJECTIVE: HPI: Patient is here for follow up of hyperlipidemia/ CAD/HTN/DM: : denies Headache;denies Chest Pain;denies weakness;denies Shortness of Breath and orthopnea;denies Visual changes;denies palpitations;denies cough;denies pedal edema;denies symptoms of TIA or stroke;deniesClaudication symptoms. admits to Compliance with medications; denies Problems with medications.  Stable. No new complaints.   PMH/PSH: reviewed/updated in Epic  SH/FH: reviewed/updated in Epic  Allergies: reviewed/updated in Epic  Medications: reviewed/updated in Epic  Immunizations: reviewed/updated in Epic  ROS: As above in the HPI. All other systems are stable or negative.  OBJECTIVE: APPEARANCE:  Patient in no acute distress.The patient appeared well nourished and normally developed. Acyanotic. Waist: VITAL SIGNS:  SKIN: warm and  Dry without overt rashes, tattoos and scars  HEAD and Neck: without JVD, Head and scalp: normal Eyes:No scleral icterus. Fundi normal, eye movements normal. Ears: Auricle normal, canal normal, Tympanic membranes normal, insufflation normal. Nose: normal Throat: normal Neck & thyroid: normal  CHEST & LUNGS: Chest wall: normal Lungs: Clear  CVS: Reveals the PMI to be normally located. Regular rhythm, First and Second Heart sounds are normal,  absence of murmurs, rubs or gallops. Peripheral vasculature: Radial pulses: normal Dorsal pedis pulses: normal Posterior pulses: normal  ABDOMEN:  Appearance: normal Benign,, no organomegaly, no masses, no Abdominal Aortic enlargement. No Guarding , no rebound. No Bruits. Bowel sounds: normal  RECTAL: N/A GU: N/A  EXTREMETIES: nonedematous. Both Femoral and Pedal pulses are normal.  MUSCULOSKELETAL:  Spine: normal Joints: intact  NEUROLOGIC: oriented to time,place and person; nonfocal. Strength is normal Sensory is  normal Reflexes are normal Cranial Nerves are normal.  ASSESSMENT: CKD (chronic kidney disease)  CAD  Diabetic neuropathy  Degenerative joint disease of left shoulder  DM  HYPERLIPIDEMIA  HYPERTENSION, BENIGN  Vitamin D deficiency    PLAN:      Dr Paula Libra Recommendations  Diet and Exercise discussed with patient.  For nutrition information, I recommend books:  1).Eat to Live by Dr Excell Seltzer. 2).Prevent and Reverse Heart Disease by Dr Karl Luke.  Exercise recommendations are:  If unable to walk, then the patient can exercise in a chair 3 times a day. By flapping arms like a bird gently and raising legs outwards to the front.  If ambulatory, the patient can go for walks for 30 minutes 3 times a week. Then increase the intensity and duration as tolerated.  Goal is to try to attain exercise frequency to 5 times a week.  If applicable: Best to perform resistance exercises (machines or weights) 2 days a week and cardio type exercises 3 days per week.  No orders of the defined types were placed in this encounter.   No results found for this or any previous visit (from the past 24 hour(s)). No orders of the defined types were placed in this encounter.    RTc in 3 months.  dietary changes and recommendations, discussed.  Persis Graffius P. Jacelyn Grip, M.D.

## 2013-03-31 NOTE — Patient Instructions (Signed)
      Dr Tarin Navarez's Recommendations  Diet and Exercise discussed with patient.  For nutrition information, I recommend books:  1).Eat to Live by Dr Joel Fuhrman. 2).Prevent and Reverse Heart Disease by Dr Caldwell Esselstyn.  Exercise recommendations are:  If unable to walk, then the patient can exercise in a chair 3 times a day. By flapping arms like a bird gently and raising legs outwards to the front.  If ambulatory, the patient can go for walks for 30 minutes 3 times a week. Then increase the intensity and duration as tolerated.  Goal is to try to attain exercise frequency to 5 times a week.  If applicable: Best to perform resistance exercises (machines or weights) 2 days a week and cardio type exercises 3 days per week.  

## 2013-04-04 ENCOUNTER — Other Ambulatory Visit: Payer: Self-pay | Admitting: *Deleted

## 2013-04-04 MED ORDER — ATORVASTATIN CALCIUM 80 MG PO TABS: 80.0000 mg | ORAL_TABLET | Freq: Every day | ORAL | Status: DC

## 2013-04-04 NOTE — Telephone Encounter (Signed)
Coverage for Crestor 20mg  needs prior authorization.  Pharmacy wants to know if you would consider prescribing Lipitor 80mg  instead?  If not I will proceed with prior authorization.

## 2013-04-04 NOTE — Telephone Encounter (Signed)
Let patient know med was changed from crestor to lipitor

## 2013-04-07 NOTE — Telephone Encounter (Signed)
Patient aware.

## 2013-04-11 ENCOUNTER — Ambulatory Visit (INDEPENDENT_AMBULATORY_CARE_PROVIDER_SITE_OTHER): Payer: BC Managed Care – PPO | Admitting: Urology

## 2013-04-11 DIAGNOSIS — N393 Stress incontinence (female) (male): Secondary | ICD-10-CM

## 2013-04-11 DIAGNOSIS — N3642 Intrinsic sphincter deficiency (ISD): Secondary | ICD-10-CM

## 2013-04-11 DIAGNOSIS — R3915 Urgency of urination: Secondary | ICD-10-CM

## 2013-04-22 ENCOUNTER — Ambulatory Visit: Payer: BC Managed Care – PPO

## 2013-04-29 ENCOUNTER — Ambulatory Visit (INDEPENDENT_AMBULATORY_CARE_PROVIDER_SITE_OTHER): Payer: BC Managed Care – PPO | Admitting: Pharmacist Clinician (PhC)/ Clinical Pharmacy Specialist

## 2013-04-29 DIAGNOSIS — E119 Type 2 diabetes mellitus without complications: Secondary | ICD-10-CM

## 2013-04-29 MED ORDER — INSULIN DETEMIR 100 UNIT/ML ~~LOC~~ SOLN: 35.0000 [IU] | Freq: Every day | SUBCUTANEOUS | Status: DC

## 2013-04-29 NOTE — Progress Notes (Signed)
  Subjective:    Patient ID: [name], female    DOB: 07/24/1951, 62 y.o.   MRN: [id number]  HPI:  Follow up Type 2 diabetes and lipids and questions concerning medication concerns.    Review of Systems  Constitutional: Negative.   Musculoskeletal: Positive for arthralgias.  Psychiatric/Behavioral: Negative.        Objective:   Physical Exam  Constitutional: She is oriented to person, place, and time. She appears well-developed and well-nourished.  Neurological: She is alert and oriented to person, place, and time. She has normal reflexes.  Skin: Skin is warm and dry.  Psychiatric: She has a normal mood and affect. Her behavior is normal. Judgment and thought content normal.          Assessment & Plan:   Diabetes Follow-Up Visit Chief Complaint:  No chief complaint on file.    Exam Regularity:  RRR Edema:  neg  Polyuria:  neg  Polydipsia:  neg Polyphagia:  neg  BMI:  There is no weight on file to calculate BMI.   Weight changes:  Gradual weight gain with insulin General Appearance:  alert, oriented, no acute distress Mood/Affect:  normal  HPI:  Long standing type 2 diabetes and dyslipidemia  Low fat/carbohydrate diet?  No Nicotine Abuse?  No Medication Compliance?  Yes Exercise?  No Alcohol Abuse?  No  Home BG Monitoring:  Checking 2 times a day. Average:  N/A  High: N/A  Low:  N/A  Did not bring in home list of BG   Lab Results  Component Value Date   HGBA1C 7.9 03/25/2013    No results found for this basename: MICROALBUR, [id number]    Lab Results  Component Value Date   CHOL  Value: 173        ATP III CLASSIFICATION:  <200     mg/dL   Desirable  200-239  mg/dL   Borderline High  >=240    mg/dL   High 06/17/2008   HDL 57 06/17/2008   LDLCALC 148* 03/25/2013   TRIG 209* 03/25/2013   CHOLHDL 3.0 06/17/2008      Assessment: 1.  Diabetes.  Above goal of A1C  <,7% 2.  Blood Pressure.  Needs tighter control with CKD 3.  Lipids.  On crestor 20mg  with  pending labs scheduled 4.  Foot Care.  Daily foot care reviewed 5.  Dental Care.  annual 6.  Eye Care/Exam.  annual  Recommendations: 1.  Patient is counseled on appropriate foot care. 2.  BP goal < 130/80. 3.  LDL goal of < 100, HDL > 40 and TG < 150. 4.  Eye Exam yearly and Dental Exam every 6 months. 5.  Dietary recommendations:  1800 calorie diet ADA  6.  Physical Activity recommendations:  30 minutes daily walking 7.  Medication recommendations at this time are as follows:  Stop actos and increase levemir to 35 units a day.  Go back on crestor 20mg  gave samples to last till August when it will be covered under her new insurance plan 8.  Return to clinic in 4-6 wks   Time spent counseling patient:  45 min  Physician time spent with patient:  0 Referring provider:  wong   PharmD:  Memory Argue, [facility]

## 2013-05-05 ENCOUNTER — Telehealth: Payer: Self-pay | Admitting: Family Medicine

## 2013-05-05 DIAGNOSIS — E119 Type 2 diabetes mellitus without complications: Secondary | ICD-10-CM

## 2013-05-05 MED ORDER — INSULIN DETEMIR 100 UNIT/ML ~~LOC~~ SOLN: 35.0000 [IU] | Freq: Every day | SUBCUTANEOUS | Status: DC

## 2013-05-05 NOTE — Telephone Encounter (Signed)
Patient called needs Levemir pens not vials. Called [facility] and medication changed

## 2013-05-09 ENCOUNTER — Ambulatory Visit (INDEPENDENT_AMBULATORY_CARE_PROVIDER_SITE_OTHER): Payer: BC Managed Care – PPO | Admitting: Physician Assistant

## 2013-05-09 ENCOUNTER — Encounter: Payer: Self-pay | Admitting: Physician Assistant

## 2013-05-09 VITALS — BP 124/62 | HR 61 | Ht 64.0 in | Wt 219.8 lb

## 2013-05-09 DIAGNOSIS — N183 Chronic kidney disease, stage 3 unspecified: Secondary | ICD-10-CM

## 2013-05-09 DIAGNOSIS — R0602 Shortness of breath: Secondary | ICD-10-CM

## 2013-05-09 DIAGNOSIS — E785 Hyperlipidemia, unspecified: Secondary | ICD-10-CM

## 2013-05-09 DIAGNOSIS — I2510 Atherosclerotic heart disease of native coronary artery without angina pectoris: Secondary | ICD-10-CM

## 2013-05-09 DIAGNOSIS — I10 Essential (primary) hypertension: Secondary | ICD-10-CM

## 2013-05-09 DIAGNOSIS — Z01810 Encounter for preprocedural cardiovascular examination: Secondary | ICD-10-CM

## 2013-05-09 DIAGNOSIS — R079 Chest pain, unspecified: Secondary | ICD-10-CM

## 2013-05-09 LAB — BASIC METABOLIC PANEL
BUN: 31 mg/dL — ABNORMAL HIGH (ref 6–23)
CO2: 26 mEq/L (ref 19–32)
Calcium: 8.7 mg/dL (ref 8.4–10.5)
Chloride: 107 mEq/L (ref 96–112)
Creatinine, Ser: 2.4 mg/dL — ABNORMAL HIGH (ref 0.4–1.2)
GFR: 26.54 mL/min — ABNORMAL LOW (ref >60.00)
Glucose, Bld: 255 mg/dL — ABNORMAL HIGH (ref 70–99)
Potassium: 3.5 mEq/L (ref 3.5–5.1)
Sodium: 142 mEq/L (ref 135–145)

## 2013-05-09 LAB — BRAIN NATRIURETIC PEPTIDE: Pro B Natriuretic peptide (BNP): 69 pg/mL (ref 0.0–100.0)

## 2013-05-09 NOTE — Patient Instructions (Addendum)
PLEASE SCHEDULE ECHO  LABS TODAY; BMET, BNP  PLEASE FOLLOW UP WITH DR. HOCHREIN IN 6 MONTHS  NO MEDICATION CHANGES WERE MADE TODAY

## 2013-05-09 NOTE — Progress Notes (Addendum)
[place], [address], [place]  [address] Phone: [phone] Fax:  [phone]  Date:  05/09/2013   ID:  [name], DOB Jul 03, 1951, MRN [id number]  PCP:  Tamara Harada, MD  Cardiologist:  Dr. Minus Baker     History of Present Illness: [name] is a 62 y.o. female who returns for surgical clearance.  She needs a urologic procedure with Dr. Jeffie Baker.  She has a hx of CAD, status post PCI in 2005, DM2, HTN, HL, esophageal stricture, CKD.  [facility] 7/09: Vigorous LV function, 2-3+ MR, RI 40%, RI stent patent, proximal-mid RCA 40-50%. Echo 7/09:  EF 55-65%, trivial MR. Renal artery dopplers 3/13: patent bilat, < 60% stenosis.  Last seen by Dr. Percival Baker 11/2012 for preoperative evaluation prior to shoulder surgery. Lexiscan Myoview 11/18/12: EF 66%, normal LV wall motion, mild to moderate anterior ischemia. Study was compared with study done in 2012. Anterior defect was felt to be breast attenuation at that time. Her current study appeared to be unchanged.  Study was felt to be low risk by Dr. Percival Baker she was cleared for surgery.  She never had her shoulder surgery.  She now needs macroplastique injections for bladder incontinence.  She apparently needs gen anesthesia.  She tells me that she has noted increased dyspnea, orthopnea, PND, edema.  She also notes chest heaviness at times that may be worse with exertion.  However, her PCP stopped her neurontin.  Her symptoms have significantly improved since then.  She denies any further CP or significant dyspnea since.  She denies syncope.    Labs (4/14):  K 3.7, Cr 1.66, ALT 26, LDL 148, Hgb 13.3   Wt Readings from Last 3 Encounters:  05/09/13 219 lb 12.8 oz (99.701 kg)  03/31/13 219 lb 6.4 oz (99.519 kg)  11/18/12 223 lb (101.152 kg)     Past Medical History  Diagnosis Date  . Esophageal stricture   . Hyperlipidemia   . Hypertension   . Bronchitis   . CAD (coronary artery disease)     s/p Stenting in 2005;   [facility] 7/09: Vigorous LV function, 2-3+ MR, RI 40%, RI stent patent, proximal-mid RCA 40-50%;  Echo 7/09:  EF 55-65%, trivial MR;  Myoview 11/18/12: EF 66%, normal LV wall motion, mild to moderate anterior ischemia - reviewed by [facility] and felt to rep breast atten (low risk)  . Arthritis   . Chronic kidney disease     CREATININE IS UP--GOING TO KIDNEY MD   . Non-insulin dependent diabetes mellitus     Current Outpatient Prescriptions  Medication Sig Dispense Refill  . aspirin 81 MG EC tablet Take 81 mg by mouth daily.        [name] atenolol (TENORMIN) 50 MG tablet Take 50 mg by mouth 2 (two) times daily.      [name] glimepiride (AMARYL) 4 MG tablet Take 4 mg by mouth 2 (two) times daily.       . insulin detemir (LEVEMIR) 100 UNIT/ML injection Inject 0.35 mLs (35 Units total) into the skin daily.  15 mL  4  . NIFEdipine (PROCARDIA XL/ADALAT-CC) 90 MG 24 hr tablet Take 90 mg by mouth daily.      . rosuvastatin (CRESTOR) 20 MG tablet Take 1 tablet (20 mg total) by mouth daily.  30 tablet  3  . valsartan-hydrochlorothiazide (DIOVAN-HCT) 320-25 MG per tablet Take 1 tablet by mouth daily.         No current facility-administered medications for this  visit.    Allergies:    Allergies  Allergen Reactions  . Ace Inhibitors   . Clopidogrel Bisulfate     REACTION: Sick, Headache, "Felt terrible"  . Codeine     REACTION: hallucinations  . Penicillins     Social History:  The patient  reports that she quit smoking about 11 years ago. She has never used smokeless tobacco. She reports that she does not drink alcohol or use illicit drugs.   ROS:  Please see the history of present illness.      All other systems reviewed and negative.   PHYSICAL EXAM: VS:  BP 124/62  Pulse 61  Ht 5\' 4"  (1.626 m)  Wt 219 lb 12.8 oz (99.701 kg)  BMI 37.71 kg/m2 Well nourished, well developed, in no acute distress HEENT: normal Neck: no JVD Cardiac:  normal S1, S2; RRR; no murmur Lungs:  clear to auscultation bilaterally, no  wheezing, rhonchi or rales Abd: soft, nontender, no hepatomegaly Ext: no edema Skin: warm and dry Neuro:  CNs 2-12 intact, no focal abnormalities noted  EKG:  NSR, HR 61, lat TWI     ASSESSMENT AND PLAN:  1. Dyspnea:  Suspect side effects to neurontin.  It sounds like she had some fluid retention related to this and this may explain her constellation of symptoms.  Will obtain an echocardiogram, bmet, bnp.  If BNP high, change HCTZ to Lasix. 2. Chest Pain:  Atypical.  She notes resolution with d/c of neurontin.  She had a recent low risk myoview.  Check Echo, BMET, BNP as noted.   3. Surgical Clearance:  She does not appear to have any unstable cardiac conditions.  However, will await results of her Echo before making further recommendations regarding her upcoming surgery. 4. CAD:  Continue ASA, statin.  Reviewed with Dr. Minus Baker.  Last myoview was reviewed by him and felt to represent breast attenuation.  No further ischemic evaluation necessary.   5. Hypertension:  Controlled.  Continue current therapy.  6. Hyperlipidemia:  Continue statin. 7. CKD:  Check BMET today. 8. Disposition:  F/u Dr. Minus Baker in 6 mos.  Signed, Tamara Dopp, PA-C  05/09/2013 11:21 AM    Addendum 05/18/2013 4:34 PM: Echo 6/14: mild LVH, EF 55-65%, Gr 2 DD, PASP 44, trivial eff Echocardiogram findings stable.  No further cardiac workup needed. She may proceed with her non-cardiac procedure at acceptable risk. Tamara Dopp, PA-C   05/18/2013 4:35 PM

## 2013-05-12 ENCOUNTER — Telehealth: Payer: Self-pay | Admitting: *Deleted

## 2013-05-12 NOTE — Telephone Encounter (Signed)
Message copied by Michae Kava on Mon May 12, 2013  1:19 PM ------      Message from: [place], [place] T      Created: Sat May 10, 2013  4:07 PM       K+ ok      BNP normal      Creatinine increasing - does she see a nephrologist?        If so, fax to nephrology and f/u with them.        If not, stop HCTZ - continue Valsartan 320.  Repeat BMET in 1 week.      Richardson Dopp, PA-C        05/10/2013 4:07 PM ------

## 2013-05-12 NOTE — Telephone Encounter (Signed)
pt notified about lab results with verbal understanding today. advised to f/u w/Nephrologist Dr. Fran Lowes about creatinine increasing. pt states she has appt on 6/17, advised to keep appt. pt said ok and thank you. I will fax lab report to Nephrologist today

## 2013-05-16 ENCOUNTER — Ambulatory Visit (HOSPITAL_COMMUNITY): Payer: BC Managed Care – PPO | Attending: Family Medicine

## 2013-05-16 DIAGNOSIS — R0609 Other forms of dyspnea: Secondary | ICD-10-CM

## 2013-05-16 DIAGNOSIS — R072 Precordial pain: Secondary | ICD-10-CM

## 2013-05-16 DIAGNOSIS — R0989 Other specified symptoms and signs involving the circulatory and respiratory systems: Secondary | ICD-10-CM

## 2013-05-16 DIAGNOSIS — I2510 Atherosclerotic heart disease of native coronary artery without angina pectoris: Secondary | ICD-10-CM | POA: Insufficient documentation

## 2013-05-16 DIAGNOSIS — Z01810 Encounter for preprocedural cardiovascular examination: Secondary | ICD-10-CM | POA: Insufficient documentation

## 2013-05-16 DIAGNOSIS — R0602 Shortness of breath: Secondary | ICD-10-CM | POA: Insufficient documentation

## 2013-05-16 DIAGNOSIS — R079 Chest pain, unspecified: Secondary | ICD-10-CM | POA: Insufficient documentation

## 2013-05-16 NOTE — Progress Notes (Signed)
Echocardiogram performed.  

## 2013-05-18 ENCOUNTER — Encounter: Payer: Self-pay | Admitting: Physician Assistant

## 2013-05-19 ENCOUNTER — Telehealth: Payer: Self-pay | Admitting: *Deleted

## 2013-05-19 NOTE — Telephone Encounter (Signed)
Message copied by Michae Kava on Mon May 19, 2013 11:32 AM ------      Message from: [place], [place] T      Created: Sun May 18, 2013  4:36 PM       EF normal      Findings on echo stable when compared to last study.      She may proceed with surgery at acceptable risk      No further cardiac workup.      Please notify patient and fax to surgeon.      Please fax my recent OV note to surgeon as well.      Richardson Dopp, PA-C        05/18/2013 4:32 PM ------

## 2013-05-19 NOTE — Telephone Encounter (Signed)
pt notified about echo results and is cleared for her surgery; echo results faxed to Dr. Jeffie Pollock surgeon doing the surgery.

## 2013-05-20 ENCOUNTER — Other Ambulatory Visit: Payer: Self-pay | Admitting: Urology

## 2013-05-23 ENCOUNTER — Encounter (HOSPITAL_COMMUNITY): Payer: Self-pay | Admitting: Pharmacy Technician

## 2013-05-27 ENCOUNTER — Ambulatory Visit (INDEPENDENT_AMBULATORY_CARE_PROVIDER_SITE_OTHER): Payer: BC Managed Care – PPO | Admitting: Pharmacist Clinician (PhC)/ Clinical Pharmacy Specialist

## 2013-05-27 ENCOUNTER — Ambulatory Visit: Payer: Self-pay

## 2013-05-27 DIAGNOSIS — E1110 Type 2 diabetes mellitus with ketoacidosis without coma: Secondary | ICD-10-CM

## 2013-05-27 DIAGNOSIS — E1310 Other specified diabetes mellitus with ketoacidosis without coma: Secondary | ICD-10-CM

## 2013-05-27 MED ORDER — INSULIN LISPRO 100 UNIT/ML ~~LOC~~ SOLN: 5.0000 [IU] | Freq: Two times a day (BID) | SUBCUTANEOUS | Status: DC

## 2013-05-27 NOTE — Patient Instructions (Addendum)
[place]  05/27/2013   Your procedure is scheduled on: 06/03/13  Report to [facility] at 5:15 AM.  Call this number if you have problems the morning of surgery 336-: [phone]   Remember:   Do not eat food or drink liquids After Midnight.     Take these medicines the morning of surgery with A SIP OF WATER: atenolol, gabapentin   Do not wear jewelry, make-up or nail polish.  Do not wear lotions, powders, or perfumes. You may wear deodorant.  Do not shave 48 hours prior to surgery. Men may shave face and neck.  Do not bring valuables to the hospital.  Contacts, dentures or bridgework may not be worn into surgery.   Patients discharged the day of surgery will not be allowed to drive home.  Name and phone number of your driver: [name] (daughter)   Please read over the following fact sheets that you were given: MRSA Information.  Paulette Blanch, RN  pre op nurse call if needed [phone]    FAILURE TO FOLLOW THESE INSTRUCTIONS MAY RESULT IN CANCELLATION OF YOUR SURGERY   Patient Signature: ___________________________________________

## 2013-05-27 NOTE — Progress Notes (Signed)
  Subjective:    Patient ID: [name], female    DOB: Mar 20, 1951, 62 y.o.   MRN: [id number]  HPI:  Patient has longstanding type 2 DM with some renal impairment that prohibits the use of many DM medications such as metformin.  She is taking levemir 35 units a day and amaryl 4mg  bid.  Home blood glucose readings are running in the mid 200's since stopping Actos.  She wants to see Dr. Dorris Fetch and endocrinologist but their office is not open until July 1st.    Review of Systems  Constitutional: Negative.   HENT: Negative.   Eyes: Negative.   Respiratory: Negative.   Cardiovascular: Negative.   Gastrointestinal: Negative.   Endocrine: Negative.   Genitourinary: Negative.   Allergic/Immunologic: Negative.   Neurological: Negative.   Hematological: Negative.   Psychiatric/Behavioral: Negative.        Objective:   Physical Exam  Constitutional: She appears well-developed and well-nourished.  Cardiovascular: Normal rate and regular rhythm.   Psychiatric: She has a normal mood and affect. Her behavior is normal. Judgment and thought content normal.          Assessment & Plan:   Diabetes Follow-Up Visit Chief Complaint:  No chief complaint on file.    Exam Regularity:  RRR Edema:  neg  Polyuria:  neg  Polydipsia:  neg Polyphagia:  neg  BMI:  There is no weight on file to calculate BMI.   Weight changes:  none General Appearance:  alert, oriented, no acute distress Mood/Affect:  normal  HPI:  Long standing type 2 DM  Low fat/carbohydrate diet?  No Nicotine Abuse?  No Medication Compliance?  Yes Exercise?  No Alcohol Abuse?  No  Home BG Monitoring:  Checking 2 times a day. Average:  200  High: 278  Low:  156    Lab Results  Component Value Date   HGBA1C 7.9 03/25/2013    No results found for this basename: MICROALBUR, [id number]    Lab Results  Component Value Date   CHOL  Value: 173        ATP III CLASSIFICATION:  <200     mg/dL   Desirable  200-239  mg/dL    Borderline High  >=240    mg/dL   High 06/17/2008   HDL 57 06/17/2008   LDLCALC 148* 03/25/2013   TRIG 209* 03/25/2013   CHOLHDL 3.0 06/17/2008      Assessment: 1.  Diabetes.  Worsening since stopping actos. 2.  Blood Pressure.  Good control 3.  Lipids.  Need to re-check next visit 4.  Foot Care.  daily 5.  Dental Care.  Twice a year 6.  Eye Care/Exam.  annaul  Recommendations: 1.  Patient is counseled on appropriate foot care. 2.  BP goal < 130/80. 3.  LDL goal of < 100, HDL > 40 and TG < 150. 4.  Eye Exam yearly and Dental Exam every 6 months. 5.  Dietary recommendations:  1800 cal diet 6.  Physical Activity recommendations:  30 minutes a day 7.  Medication recommendations at this time are as follows:  Gave 8 units of humalog in office because BG was 308mg /dL.  Called in humalog pen to take 5 units before supper. 8.  Return to clinic in 4-6 wks   Time spent counseling patient:  45 min  Physician time spent with patient:  0 Referring provider:  moore   PharmD:  [facility] Pharmacist

## 2013-05-27 NOTE — Progress Notes (Addendum)
Note 05/12/13 Jorene Minors on chart, EKG 05/09/13 on EPIC, Renal Panel 05/14/13 on chart, Micronalbumin 05/14/13 on chart, office note 10/08/12 Dr. Lowanda Foster on chart

## 2013-05-28 ENCOUNTER — Telehealth: Payer: Self-pay | Admitting: Pharmacist

## 2013-05-28 ENCOUNTER — Other Ambulatory Visit: Payer: Self-pay | Admitting: Nurse Practitioner

## 2013-05-28 ENCOUNTER — Encounter (HOSPITAL_COMMUNITY)
Admission: RE | Admit: 2013-05-28 | Discharge: 2013-05-28 | Disposition: A | Discharge status: Home / Self Care | Payer: BC Managed Care – PPO | Source: Ambulatory Visit | Attending: Urology | Admitting: Urology

## 2013-05-28 ENCOUNTER — Encounter (HOSPITAL_COMMUNITY): Payer: Self-pay

## 2013-05-28 ENCOUNTER — Ambulatory Visit (HOSPITAL_COMMUNITY)
Admission: RE | Admit: 2013-05-28 | Discharge: 2013-05-28 | Disposition: A | Discharge status: Home / Self Care | Payer: BC Managed Care – PPO | Source: Ambulatory Visit | Attending: Urology | Admitting: Urology

## 2013-05-28 DIAGNOSIS — R0683 Snoring: Secondary | ICD-10-CM

## 2013-05-28 DIAGNOSIS — Z01818 Encounter for other preprocedural examination: Secondary | ICD-10-CM | POA: Insufficient documentation

## 2013-05-28 HISTORY — DX: Diverticulosis of intestine, part unspecified, without perforation or abscess without bleeding: K57.90

## 2013-05-28 HISTORY — DX: Cerebral infarction, unspecified: I63.9

## 2013-05-28 HISTORY — DX: Anemia, unspecified: D64.9

## 2013-05-28 HISTORY — DX: Type 2 diabetes mellitus with diabetic neuropathy, unspecified: E11.40

## 2013-05-28 HISTORY — DX: Cervicalgia: M54.2

## 2013-05-28 HISTORY — DX: Unspecified osteoarthritis, unspecified site: M19.90

## 2013-05-28 HISTORY — DX: Other chronic pain: G89.29

## 2013-05-28 HISTORY — DX: Vitamin D deficiency, unspecified: E55.9

## 2013-05-28 LAB — SURGICAL PCR SCREEN
MRSA, PCR: NEGATIVE
Staphylococcus aureus: NEGATIVE

## 2013-05-28 LAB — BASIC METABOLIC PANEL
BUN: 32 mg/dL — ABNORMAL HIGH (ref 6–23)
CO2: 26 mEq/L (ref 19–32)
Calcium: 9.2 mg/dL (ref 8.4–10.5)
Chloride: 100 mEq/L (ref 96–112)
Creatinine, Ser: 2.07 mg/dL — ABNORMAL HIGH (ref 0.50–1.10)
GFR calc Af Amer: 29 mL/min — ABNORMAL LOW (ref >90)
GFR calc non Af Amer: 25 mL/min — ABNORMAL LOW (ref >90)
Glucose, Bld: 369 mg/dL — ABNORMAL HIGH (ref 70–99)
Potassium: 3.5 mEq/L (ref 3.5–5.1)
Sodium: 138 mEq/L (ref 135–145)

## 2013-05-28 LAB — CBC
HCT: 40.4 % (ref 36.0–46.0)
Hemoglobin: 13.4 g/dL (ref 12.0–15.0)
MCH: 27.2 pg (ref 26.0–34.0)
MCHC: 33.2 g/dL (ref 30.0–36.0)
MCV: 81.9 fL (ref 78.0–100.0)
Platelets: 259 10*3/uL (ref 150–400)
RBC: 4.93 MIL/uL (ref 3.87–5.11)
RDW: 14.5 % (ref 11.5–15.5)
WBC: 10.2 10*3/uL (ref 4.0–10.5)

## 2013-05-28 NOTE — Telephone Encounter (Signed)
Patient was seen yesterday and changes made to insulin regimen.   She states that BG is still in 200's today. Currently insulin regimen is Levemir 40 units daily  And Humalog 5 units prior to supper and in the mornings if BG over 200.  BG was 308 in office yesterday, 282 prior to supper, 219 prior to bedtime, this am 237 gave 5 units of insulin and ate at [place].  BG at 10am was 346 and at noon today was 273.  Recommended increase Levemir to 45 units daily and Humalog to 8 units prior to each meal.

## 2013-05-28 NOTE — Progress Notes (Signed)
Pt aware ref to be set up for a sleep study

## 2013-05-28 NOTE — Progress Notes (Signed)
05/28/13 [id number]  OBSTRUCTIVE SLEEP APNEA  Have you ever been diagnosed with sleep apnea through a sleep study? No  Do you snore loudly (loud enough to be heard through closed doors)?  0  Do you often feel tired, fatigued, or sleepy during the daytime? 1  Has anyone observed you stop breathing during your sleep? 0  Do you have, or are you being treated for high blood pressure? 1  BMI more than 35 kg/m2? 1  Age over 61 years old? 1  Neck circumference greater than 40 cm/18 inches? 0  Gender: 0  Obstructive Sleep Apnea Score 4  Score 4 or greater  Results sent to PCP

## 2013-05-29 ENCOUNTER — Ambulatory Visit: Payer: BC Managed Care – PPO | Attending: Family Medicine | Admitting: Sleep Medicine

## 2013-05-29 ENCOUNTER — Other Ambulatory Visit: Payer: Self-pay

## 2013-05-29 VITALS — Ht 62.0 in | Wt 219.0 lb

## 2013-05-29 DIAGNOSIS — G4733 Obstructive sleep apnea (adult) (pediatric): Secondary | ICD-10-CM | POA: Insufficient documentation

## 2013-05-29 DIAGNOSIS — G4761 Periodic limb movement disorder: Secondary | ICD-10-CM | POA: Insufficient documentation

## 2013-05-29 DIAGNOSIS — G4730 Sleep apnea, unspecified: Secondary | ICD-10-CM

## 2013-05-29 DIAGNOSIS — R0609 Other forms of dyspnea: Secondary | ICD-10-CM

## 2013-05-30 NOTE — Procedures (Signed)
Tamara A. Tamara Laughter, MD     [email or web address]        NAMESKYLIER, [name]               ACCOUNT NO.:  [id number]  MEDICAL RECORD NO.:  [id number]          PATIENT TYPE:  OUT  LOCATION:  SLEEP LAB                     FACILITY:  [facility]  PHYSICIAN:  Zabrina Brotherton A. [name], M.D. DATE OF BIRTH:  1951/05/31  DATE OF STUDY:  05/29/2013                           NOCTURNAL POLYSOMNOGRAM  REFERRING PHYSICIAN:  MARY-MARGARET MARTIN  REFERRING PHYSICIAN:  Francis P. Jacelyn Grip, M.D.  INDICATIONS:  A 62 year old, who presents with frequent arousal fatigue, restless sleep, and snoring.  MEDICATIONS:  Atenolol, Neurontin, nifedipine, Crestor, potassium, hydrochlorothiazide, Diovan, insulin, glimepiride.  EPWORTH SLEEPINESS SCORE:  2.  BMI:  40.  ARCHITECTURAL SUMMARY:  The total recording time is 360 minutes, sleep efficiency is 54%, sleep latency 30 minutes, REM latency 195 minutes, stage N1 14%, N2 68%, N3 3%, and REM sleep 14%.  RESPIRATORY SUMMARY:  Baseline oxygen saturation is 97, lowest saturation 85 during REM sleep.  DIAGNOSTIC:  AHI is 21, with the events occurring almost exclusively during REM sleep.  The REM index/AHI index is 273.  The portion of the frank apneic events were actually central.  There were 10 central events 3 obstructive and 58 hypopneas.  LIMB MOVEMENT SUMMARY:  PLM index is 5.  ELECTROCARDIOGRAM SUMMARY:  Average heart rate is 75 with no significant dysrhythmias observed.  IMPRESSION: 1. 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  The events were worse during     REM sleep. 2. Mild periodic limb movement disorder sleep. 3. Abnormal sleep architecture with reduced sleep efficiency.  RECOMMENDATION:  Formal CPAP titration recording given the significant central component, the patient may need to be titrated on Servo- ventilation.  Thanks for this referral.    [name] A. [name],  M.D.    KAD/MEDQ  D:  05/30/2013 08:44:06  T:  05/30/2013 09:16:39  Job:  [id number]

## 2013-06-02 ENCOUNTER — Telehealth: Payer: Self-pay | Admitting: *Deleted

## 2013-06-02 NOTE — H&P (Signed)
ive Problems 1. Feelings Of Urinary Urgency 788.63 2. 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 Renal Cyst 593.2 4. Stress Incontinence 788.39  History of Present Illness  Mr. [name] returns today in f/u from her UDS study.   The urodynamics demonstrated a stable hypersensitive small capacity bladder with SUI and a VLPP of 54cm which is consistent with ISD.   She has some increased urge and pressure today but her UA is clear.   Past Medical History 1. History of  Diabetes Mellitus 250.00 2. History of  Heart Disease 429.9 3. History of  Hypercholesterolemia 272.0 4. History of  Hypertension 401.9  Surgical History 1. History of  Cath Placement Of Stent 1 2. History of  Cervical Vertebral Fusion 3. History of  Lower Back Surgery 4. History of  Lower Back Surgery 5. History of  Suprapubic Sling Operation  Current Meds 1. Atenolol 50 MG Oral Tablet; Therapy: (Recorded:14Mar2013) to 2. Bayer Aspirin EC Low Dose 81 MG Oral Tablet Delayed Release; Therapy:  (Recorded:14Mar2013) to 3. Diazepam 5 MG Oral Tablet; Therapy: (Recorded:14Mar2013) to 4. Gabapentin 300 MG Oral Capsule; Therapy: (Recorded:14Mar2013) to 5. Glimepiride 4 MG Oral Tablet; Therapy: (Recorded:14Mar2013) to 6. HumaLOG 100 UNIT/ML Subcutaneous Solution; Therapy: (Recorded:14Mar2013) to 7. Lipitor 80 MG Oral Tablet; Therapy: (Recorded:09May2014) to 8. Nifediac CC 90 MG Oral Tablet Extended Release 24 Hour; Therapy: (Recorded:14Mar2013) to 9. Nitroglycerin 0.4 MG SUBL; Therapy: (Recorded:14Mar2013) to 10. Pioglitazone HCl 30 MG Oral Tablet; Therapy: (Recorded:14Mar2013) to 11. Stool Softener CAPS; Therapy: (Recorded:14Mar2013) to 12. Valsartan-Hydrochlorothiazide 320-25 MG Oral Tablet; Therapy: (Recorded:14Mar2013) to  Allergies 1. Codeine Derivatives 2. Lisinopril TABS 3. Plavix TABS 4. Penicillins  Family History 1. Family history of  Family Health Status Number Of Children 2. Paternal history of  Hypertension  V17.49 3. Maternal history of  Nephrolithiasis  Social History 1. Alcohol Use 2 per week sometimes 2. Caffeine Use 3-4 per day 3. Currently On Disability 4. Marital History - Widowed 5. Tobacco Use 305.1 Smoked about 1 ppd for about 20 and quit 2003  Review of Systems  Genitourinary: urinary urgency.  Cardiovascular: no chest pain.  Respiratory: no shortness of breath.  Musculoskeletal: (myalgias after switching to lipitor. ).    Vitals Vital Signs [Data Includes: Last 1 Day]  [id number] 09:01AM  Blood Pressure: 136 / 82 Temperature: 98.2 F Heart Rate: 66  Physical Exam Constitutional: Well nourished and well developed . No acute distress.  Pulmonary: No respiratory distress and normal respiratory rhythm and effort.  Cardiovascular: Heart rate and rhythm are normal . No peripheral edema.  Abdomen: The abdomen is obese. The abdomen is soft and nontender.  Genitourinary:   Examination of the external genitalia shows normal female external genitalia. The urethra is normal in appearance. Urethral hypermobility is not present. Vaginal exam demonstrates atrophy and the vaginal epithelium to be poorly estrogenized. No cystocele is identified. No rectocele is identified. The anus is normal on inspection. The perineum is normal on inspection.    Results/Data Urine [Data Includes: Last 1 Day]   [id number]  COLOR YELLOW   APPEARANCE CLEAR   SPECIFIC GRAVITY 1.015   pH 6.0   GLUCOSE NEG mg/dL  BILIRUBIN NEG   KETONE NEG mg/dL  BLOOD NEG   PROTEIN > 300 mg/dL  UROBILINOGEN 0.2 mg/dL  NITRITE NEG   LEUKOCYTE ESTERASE NEG   SQUAMOUS EPITHELIAL/HPF MANY   WBC 0-2 WBC/hpf  RBC NONE SEEN RBC/hpf  BACTERIA MODERATE   CRYSTALS NONE SEEN   CASTS NONE SEEN    The  following images/tracing/specimen were independently visualized:  I have reviewed her urodynamic tracing, report and flouro imaging with results noted above.    Procedure  Procedure: Cystoscopy   Indication: Lower  Urinary Tract Symptoms.  Informed Consent: Risks, benefits, and potential adverse events were discussed and informed consent was obtained from the patient.  Prep: The patient was prepped with betadine.  Procedure Note:  Urethral meatus:. No abnormalities.  Anterior urethra: No abnormalities . About 2cm.  Bladder: Visulization was clear. The ureteral orifices were in the normal anatomic position bilaterally and had clear efflux of urine. A systematic survey of the bladder demonstrated no bladder tumors or stones. The mucosa was smooth without abnormalities. Examination of the bladder demonstrated mild trabeculation. The patient tolerated the procedure well.  Complications: None.    Assessment 1. Stress Incontinence 788.39 2. 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 Feelings Of Urinary Urgency 788.63   She has SUI with ISD and some sensory urgency.    Plan Stress Incontinence (788.39)  1. UA With REFLEX  Done: [id number] 09:11AM   I discussed the treatment options including a repeat sling or macroplastique. After a review of the risks she is interested in macroplastique.   I discussed the risks of bleeding, infection, erosion, retention, persistant incontinence, need for second injections, thrombotic events and anesthetic complications. We will schedule this at her convenience and get cardiac clearance from Dr. Percival Spanish.   Discussion/Summary   CC: Dr. Lowanda Foster and Dr. Yaakov Guthrie.

## 2013-06-02 NOTE — Telephone Encounter (Signed)
LM, sleep study was normal.

## 2013-06-03 ENCOUNTER — Encounter (HOSPITAL_COMMUNITY): Payer: Self-pay | Admitting: Certified Registered Nurse Anesthetist

## 2013-06-03 ENCOUNTER — Encounter (HOSPITAL_COMMUNITY): Payer: Self-pay | Admitting: *Deleted

## 2013-06-03 ENCOUNTER — Ambulatory Visit (HOSPITAL_COMMUNITY): Payer: BC Managed Care – PPO | Admitting: Certified Registered Nurse Anesthetist

## 2013-06-03 ENCOUNTER — Encounter (HOSPITAL_COMMUNITY)
Admission: RE | Disposition: A | Discharge status: Home / Self Care | Payer: Self-pay | Source: Ambulatory Visit | Attending: Urology

## 2013-06-03 ENCOUNTER — Ambulatory Visit (HOSPITAL_COMMUNITY)
Admission: RE | Admit: 2013-06-03 | Discharge: 2013-06-03 | Disposition: A | Discharge status: Home / Self Care | Payer: BC Managed Care – PPO | Source: Ambulatory Visit | Attending: Urology | Admitting: Urology

## 2013-06-03 ENCOUNTER — Telehealth: Payer: Self-pay | Admitting: Pharmacist Clinician (PhC)/ Clinical Pharmacy Specialist

## 2013-06-03 DIAGNOSIS — N393 Stress incontinence (female) (male): Secondary | ICD-10-CM | POA: Insufficient documentation

## 2013-06-03 DIAGNOSIS — I519 Heart disease, unspecified: Secondary | ICD-10-CM | POA: Insufficient documentation

## 2013-06-03 DIAGNOSIS — Z880 Allergy status to penicillin: Secondary | ICD-10-CM | POA: Insufficient documentation

## 2013-06-03 DIAGNOSIS — Z885 Allergy status to narcotic agent status: Secondary | ICD-10-CM | POA: Insufficient documentation

## 2013-06-03 DIAGNOSIS — Z79899 Other long term (current) drug therapy: Secondary | ICD-10-CM | POA: Insufficient documentation

## 2013-06-03 DIAGNOSIS — E119 Type 2 diabetes mellitus without complications: Secondary | ICD-10-CM | POA: Insufficient documentation

## 2013-06-03 DIAGNOSIS — Z794 Long term (current) use of insulin: Secondary | ICD-10-CM | POA: Insufficient documentation

## 2013-06-03 DIAGNOSIS — N3642 Intrinsic sphincter deficiency (ISD): Secondary | ICD-10-CM | POA: Insufficient documentation

## 2013-06-03 DIAGNOSIS — N281 Cyst of kidney, acquired: Secondary | ICD-10-CM | POA: Insufficient documentation

## 2013-06-03 DIAGNOSIS — R3915 Urgency of urination: Secondary | ICD-10-CM | POA: Insufficient documentation

## 2013-06-03 DIAGNOSIS — Z7982 Long term (current) use of aspirin: Secondary | ICD-10-CM | POA: Insufficient documentation

## 2013-06-03 DIAGNOSIS — E7800 Pure hypercholesterolemia, unspecified: Secondary | ICD-10-CM | POA: Insufficient documentation

## 2013-06-03 DIAGNOSIS — I10 Essential (primary) hypertension: Secondary | ICD-10-CM | POA: Insufficient documentation

## 2013-06-03 DIAGNOSIS — Z888 Allergy status to other drugs, medicaments and biological substances status: Secondary | ICD-10-CM | POA: Insufficient documentation

## 2013-06-03 HISTORY — PX: CYSTOSCOPY WITH INJECTION: SHX1424

## 2013-06-03 LAB — GLUCOSE, CAPILLARY
Glucose-Capillary: 187 mg/dL — ABNORMAL HIGH (ref 70–99)
Glucose-Capillary: 170 mg/dL — ABNORMAL HIGH (ref 70–99)
Glucose-Capillary: 193 mg/dL — ABNORMAL HIGH (ref 70–99)

## 2013-06-03 SURGERY — CYSTOSCOPY, WITH INJECTION OF BLADDER NECK OR BLADDER WALL
Anesthesia: Monitor Anesthesia Care | Wound class: Clean Contaminated

## 2013-06-03 MED ORDER — PROPOFOL 10 MG/ML IV BOLUS
INTRAVENOUS | Status: DC | PRN
  Administered 2013-06-03: 15 mg via INTRAVENOUS
  Administered 2013-06-03 (×2): 30 mg via INTRAVENOUS
  Administered 2013-06-03: 60 mg via INTRAVENOUS
  Administered 2013-06-03 (×3): 15 mg via INTRAVENOUS

## 2013-06-03 MED ORDER — LACTATED RINGERS IV SOLN
INTRAVENOUS | Status: DC | PRN
  Administered 2013-06-03: 07:00:00 via INTRAVENOUS

## 2013-06-03 MED ORDER — SODIUM CHLORIDE 0.9 % IR SOLN
Status: DC | PRN
  Administered 2013-06-03: 3000 mL via INTRAVESICAL

## 2013-06-03 MED ORDER — TRAMADOL HCL 50 MG PO TABS: 50.0000 mg | ORAL_TABLET | Freq: Four times a day (QID) | ORAL | Status: DC | PRN

## 2013-06-03 MED ORDER — FENTANYL CITRATE 0.05 MG/ML IJ SOLN
INTRAMUSCULAR | Status: AC
  Filled 2013-06-03: qty 2

## 2013-06-03 MED ORDER — ACETAMINOPHEN 325 MG PO TABS: 650.0000 mg | ORAL_TABLET | ORAL | Status: DC | PRN

## 2013-06-03 MED ORDER — FENTANYL CITRATE 0.05 MG/ML IJ SOLN
25.0000 ug | INTRAMUSCULAR | Status: DC | PRN
  Administered 2013-06-03: 50 ug via INTRAVENOUS

## 2013-06-03 MED ORDER — SODIUM CHLORIDE 0.9 % IJ SOLN: 3.0000 mL | INTRAMUSCULAR | Status: DC | PRN

## 2013-06-03 MED ORDER — CIPROFLOXACIN IN D5W 400 MG/200ML IV SOLN
400.0000 mg | INTRAVENOUS | Status: AC
  Administered 2013-06-03: 400 mg via INTRAVENOUS

## 2013-06-03 MED ORDER — CIPROFLOXACIN IN D5W 400 MG/200ML IV SOLN
INTRAVENOUS | Status: AC
  Filled 2013-06-03: qty 200

## 2013-06-03 MED ORDER — CIPROFLOXACIN HCL 500 MG PO TABS: 500.0000 mg | ORAL_TABLET | Freq: Two times a day (BID) | ORAL | Status: DC

## 2013-06-03 MED ORDER — LACTATED RINGERS IV SOLN
INTRAVENOUS | Status: DC
  Administered 2013-06-03: 1000 via INTRAVENOUS

## 2013-06-03 MED ORDER — MIDAZOLAM HCL 5 MG/5ML IJ SOLN
INTRAMUSCULAR | Status: DC | PRN
  Administered 2013-06-03: 2 mg via INTRAVENOUS

## 2013-06-03 MED ORDER — FENTANYL CITRATE 0.05 MG/ML IJ SOLN
INTRAMUSCULAR | Status: DC | PRN
  Administered 2013-06-03: 50 ug via INTRAVENOUS

## 2013-06-03 MED ORDER — SODIUM CHLORIDE 0.9 % IV SOLN: 250.0000 mL | INTRAVENOUS | Status: DC | PRN

## 2013-06-03 MED ORDER — OXYCODONE HCL 5 MG PO TABS: 5.0000 mg | ORAL_TABLET | ORAL | Status: DC | PRN

## 2013-06-03 MED ORDER — ONDANSETRON HCL 4 MG/2ML IJ SOLN: 4.0000 mg | Freq: Four times a day (QID) | INTRAMUSCULAR | Status: DC | PRN

## 2013-06-03 MED ORDER — FENTANYL CITRATE 0.05 MG/ML IJ SOLN
25.0000 ug | INTRAMUSCULAR | Status: DC | PRN
Start: 2013-06-03 — End: 2013-06-03

## 2013-06-03 MED ORDER — LIDOCAINE HCL 2 % EX GEL
CUTANEOUS | Status: AC
  Filled 2013-06-03: qty 10

## 2013-06-03 MED ORDER — SODIUM CHLORIDE 0.9 % IJ SOLN: 3.0000 mL | Freq: Two times a day (BID) | INTRAMUSCULAR | Status: DC

## 2013-06-03 MED ORDER — PROMETHAZINE HCL 25 MG/ML IJ SOLN: 6.2500 mg | INTRAMUSCULAR | Status: DC | PRN

## 2013-06-03 MED ORDER — ACETAMINOPHEN 650 MG RE SUPP
650.0000 mg | RECTAL | Status: DC | PRN
  Filled 2013-06-03: qty 1

## 2013-06-03 SURGICAL SUPPLY — 19 items
BAG URO CATCHER STRL LF (DRAPE) ×2 IMPLANT
CLOTH BEACON ORANGE TIMEOUT ST (SAFETY) ×2 IMPLANT
DEFLUX NEEDLE (NEEDLE) IMPLANT
DEFLUX SYRINGE (SYRINGE) IMPLANT
DRAPE CAMERA CLOSED 9X96 (DRAPES) ×2 IMPLANT
GLOVE SURG SS PI 8.0 STRL IVOR (GLOVE) ×2 IMPLANT
GOWN PREVENTION PLUS XLARGE (GOWN DISPOSABLE) ×2 IMPLANT
GOWN STRL REIN XL XLG (GOWN DISPOSABLE) ×2 IMPLANT
INJECTION MACROPLASIQ 2.5ML UN (Female Continence) IMPLANT
MACROPLASTIQUE 2.5ML UNIT (Female Continence) ×4 IMPLANT
MANIFOLD NEPTUNE II (INSTRUMENTS) ×2 IMPLANT
NDL RIGID UROPLASTY (NEEDLE) IMPLANT
NDL SAFETY ECLIPSE 18X1.5 (NEEDLE) IMPLANT
NEEDLE HYPO 18GX1.5 SHARP (NEEDLE)
NEEDLE RIGID UROPLASTY (NEEDLE) ×2 IMPLANT
PACK CYSTO (CUSTOM PROCEDURE TRAY) ×2 IMPLANT
SYR CONTROL 10ML LL (SYRINGE) IMPLANT
TUBING CONNECTING 10 (TUBING) IMPLANT
WATER STERILE IRR 3000ML UROMA (IV SOLUTION) ×2 IMPLANT

## 2013-06-03 NOTE — Anesthesia Postprocedure Evaluation (Signed)
  Anesthesia Post-op Note  Patient: [name]  Procedure(s) Performed: Procedure(s) (LRB): MACROPLASTIQUE  INJECTION  (N/A)  Patient Location: PACU  Anesthesia Type: MAC  Level of Consciousness: awake and alert   Airway and Oxygen Therapy: Patient Spontanous Breathing  Post-op Pain: mild  Post-op Assessment: Post-op Vital signs reviewed, Patient's Cardiovascular Status Stable, Respiratory Function Stable, Patent Airway and No signs of Nausea or vomiting  Last Vitals:  Filed Vitals:   06/03/13 0830  BP:   Pulse:   Temp: 36.5 C  Resp:     Post-op Vital Signs: stable   Complications: No apparent anesthesia complications

## 2013-06-03 NOTE — Brief Op Note (Signed)
06/03/2013  8:06 AM  PATIENT:  [name]  62 y.o. female  PRE-OPERATIVE DIAGNOSIS:  STRESS URINARY INCONTINENCE INTRINSIC SPHINTER DEFIENENCY   POST-OPERATIVE DIAGNOSIS:  STRESS URINARY INCONTINENCE INTRINSIC SPHINTER DEFIENENCY   PROCEDURE:  Procedure(s): MACROPLASTIQUE  INJECTION  (N/A)  SURGEON:  Surgeon(s) and Role:    * Malka So, MD - Primary  PHYSICIAN ASSISTANT:   ASSISTANTS: none   ANESTHESIA:   MAC  EBL:     BLOOD ADMINISTERED:none  DRAINS: none   LOCAL MEDICATIONS USED:  NONE  SPECIMEN:  No Specimen  DISPOSITION OF SPECIMEN:  N/A  COUNTS:  YES  TOURNIQUET:  * No tourniquets in log *  DICTATION: .Other Dictation: Dictation Number [id number]  PLAN OF CARE: Discharge to home after PACU  PATIENT DISPOSITION:  PACU - hemodynamically stable.   Delay start of Pharmacological VTE agent (>24hrs) due to surgical blood loss or risk of bleeding: not applicable

## 2013-06-03 NOTE — Transfer of Care (Signed)
Immediate Anesthesia Transfer of Care Note  Patient: [name]  Procedure(s) Performed: Procedure(s): MACROPLASTIQUE  INJECTION  (N/A)  Patient Location: PACU  Anesthesia Type:MAC  Level of Consciousness: awake, sedated and patient cooperative  Airway & Oxygen Therapy: Patient Spontanous Breathing and Patient connected to face mask oxygen  Post-op Assessment: Report given to PACU RN and Post -op Vital signs reviewed and stable  Post vital signs: Reviewed and stable  Complications: No apparent anesthesia complications

## 2013-06-03 NOTE — Anesthesia Preprocedure Evaluation (Signed)
Anesthesia Evaluation  Patient identified by MRN, date of birth, ID band Patient awake    Reviewed: Allergy & Precautions, H&P , NPO status , Patient's Chart, lab work & pertinent test results  Airway Mallampati: II TM Distance: <3 FB Neck ROM: Full    Dental no notable dental hx.    Pulmonary neg pulmonary ROS,  breath sounds clear to auscultation  Pulmonary exam normal       Cardiovascular hypertension, negative cardio ROS  Rhythm:Regular Rate:Normal     Neuro/Psych negative neurological ROS  negative psych ROS   GI/Hepatic negative GI ROS, Neg liver ROS,   Endo/Other  diabetes, Insulin DependentMorbid obesity  Renal/GU negative Renal ROS  negative genitourinary   Musculoskeletal negative musculoskeletal ROS (+)   Abdominal   Peds negative pediatric ROS (+)  Hematology negative hematology ROS (+)   Anesthesia Other Findings   Reproductive/Obstetrics negative OB ROS                           Anesthesia Physical Anesthesia Plan  ASA: III  Anesthesia Plan: MAC   Post-op Pain Management:    Induction: Intravenous  Airway Management Planned: Nasal Cannula  Additional Equipment:   Intra-op Plan:   Post-operative Plan:   Informed Consent: I have reviewed the patients History and Physical, chart, labs and discussed the procedure including the risks, benefits and alternatives for the proposed anesthesia with the patient or authorized representative who has indicated his/her understanding and acceptance.     Plan Discussed with: CRNA and Surgeon  Anesthesia Plan Comments:         Anesthesia Quick Evaluation

## 2013-06-03 NOTE — Interval H&P Note (Signed)
History and Physical Interval Note:  06/03/2013 7:29 AM  [name]  has presented today for surgery, with the diagnosis of STRESS URINARY INCONTINENCE INTRINSIC SPHINTER DEFIENENCY   The various methods of treatment have been discussed with the patient and family. After consideration of risks, benefits and other options for treatment, the patient has consented to  Procedure(s): MACROPLASTIQUE  INJECTION  (N/A) as a surgical intervention .  [name] history has been reviewed, patient examined, no change in status, stable for surgery.  I have reviewed the patient's chart and labs.  Questions were answered to the patient's satisfaction.     Salayah Meares J

## 2013-06-03 NOTE — Telephone Encounter (Signed)
Called patient about increasing humlog prior to meals depending on BG readings and that I will call on 7/14 to get her in with Dr. Dorris Fetch

## 2013-06-04 NOTE — Op Note (Signed)
NAMERIVERLY, HOGSED NO.:  [id number]  MEDICAL RECORD NO.:  [id number]  LOCATION:  [facility]                         FACILITY:  [facility]  PHYSICIAN:  [place] Cork. Jeffie Pollock, M.D.    DATE OF BIRTH:  04-Mar-1951  DATE OF PROCEDURE: DATE OF DISCHARGE:  06/03/2013                              OPERATIVE REPORT   PROCEDURE:  Macroplastique implantation.  PREOPERATIVE DIAGNOSIS:  Stress urinary incontinence with intrinsic sphincter deficiency.  POSTOPERATIVE DIAGNOSIS:  Stress urinary incontinence with intrinsic sphincter deficiency.  SURGEON:  [place] Cork. Jeffie Pollock, M.D.  ANESTHESIA:  MAC.  DRAINS:  None.  COMPLICATIONS:  None.  INDICATION:  [name] is a 62 year old African American female with urinary incontinence, who was found to have a small capacity, stable hypersensitive bladder with a Valsalva leak point pressure of only 54 cm of water consistent ISD.  After reviewing her options, we have elected to proceed with a Macroplastique injection since she has had a prior sling.  FINDINGS AND PROCEDURE:  Her preoperative urine culture was negative. She was given Cipro.  She was taken to the operating room where she was placed in lithotomy position and sedation was given as needed.  Her perineum and genitalia were prepped with Betadine solution.  She was draped in usual sterile fashion.  The injection scope was then prepared and inserted.  Inspection revealed a short but patulous urethra.  The bladder wall was smooth and pale without tumor, stones, or inflammation. Ureteral orifices were unremarkable.  At this point, the Macroplastique injection system was prepared.  The initial 2.5 mL syringe was placed in the injection handle and secured to the needle, which was then primed.  The needle was passed through the scope and the initial injection was made at approximately 3 o'clock in the midurethral level.  The tip was advanced at 45 degrees and then turned parallel.  Due to short  urethra, I was not able to advance it to the 2nd mark.  During the injection process, she twitched and the needle moved, so I had to replace the needle and reinject.  Approximately half the syringe was injected into the urethral submucosa on the left.  This was followed by a similar injection on the right.  There was some difficulty once again with the shortness of the urethra and some movement during the placement, but eventually was able to get the mucosa to bulge towards the midline.  Once the initial syringe was placed, a second syringe was inserted and additional injections were performed with both 6 and 12 o'clock with approximately 50% of the material in each area.  Once again with bulging of the mucosa to the midline, after each injection the needle was held in place for 30 seconds before removal.  There was some loss of material, but final inspection at the end revealed coapted mucosa at the bladder neck.  At this point, the bladder was drained.  The patient was taken down from lithotomy position and moved to recovery room in stable condition.     [place] Cork. Jeffie Pollock, M.D.     JJW/MEDQ  D:  06/03/2013  T:  06/03/2013  Job:  [id number]

## 2013-06-17 ENCOUNTER — Telehealth: Payer: Self-pay | Admitting: Pharmacist Clinician (PhC)/ Clinical Pharmacy Specialist

## 2013-06-17 NOTE — Telephone Encounter (Signed)
Called Dr. Liliane Channel office at 8:30am and got an automated voicemail.  I left a detailed message with Francis Burright's information (DOB, phone number, etc) and let them know she is on EMR.  I also left my information and phone number.  I requested an appointment be set up with Dr. Dorris Fetch for helping Mrs. Koelzer better control her diabetes.

## 2013-06-19 ENCOUNTER — Telehealth: Payer: Self-pay | Admitting: Pharmacist

## 2013-06-19 NOTE — Telephone Encounter (Signed)
Patient called to follow up appt with Dr nida.  Let her know that Sharyn Lull has left a message with his office 2 days ago.  Patient still c/o BG in the 200's since stopping actos despite increasing insulin doses.  She would like to restart actos at least until she can get into see Dr. Dorris Fetch.  She understands the incidence of bladder cancer with Actos.  Will restart Actos at lowest dose - 30mg  1/2 tablet daily

## 2013-07-09 ENCOUNTER — Other Ambulatory Visit: Payer: BC Managed Care – PPO

## 2013-07-14 ENCOUNTER — Encounter: Payer: Self-pay | Admitting: *Deleted

## 2013-07-15 ENCOUNTER — Ambulatory Visit: Payer: BC Managed Care – PPO | Admitting: Family Medicine

## 2013-07-30 ENCOUNTER — Encounter: Payer: Self-pay | Admitting: Family Medicine

## 2013-07-30 ENCOUNTER — Other Ambulatory Visit: Payer: Self-pay | Admitting: Family Medicine

## 2013-07-30 ENCOUNTER — Ambulatory Visit (INDEPENDENT_AMBULATORY_CARE_PROVIDER_SITE_OTHER): Payer: BC Managed Care – PPO | Admitting: Family Medicine

## 2013-07-30 VITALS — BP 161/80 | HR 79 | Temp 97.2°F | Wt 218.8 lb

## 2013-07-30 DIAGNOSIS — I10 Essential (primary) hypertension: Secondary | ICD-10-CM

## 2013-07-30 DIAGNOSIS — E785 Hyperlipidemia, unspecified: Secondary | ICD-10-CM

## 2013-07-30 DIAGNOSIS — E1149 Type 2 diabetes mellitus with other diabetic neurological complication: Secondary | ICD-10-CM

## 2013-07-30 DIAGNOSIS — E1142 Type 2 diabetes mellitus with diabetic polyneuropathy: Secondary | ICD-10-CM

## 2013-07-30 DIAGNOSIS — E1140 Type 2 diabetes mellitus with diabetic neuropathy, unspecified: Secondary | ICD-10-CM

## 2013-07-30 DIAGNOSIS — I2510 Atherosclerotic heart disease of native coronary artery without angina pectoris: Secondary | ICD-10-CM

## 2013-07-30 DIAGNOSIS — N189 Chronic kidney disease, unspecified: Secondary | ICD-10-CM

## 2013-07-30 DIAGNOSIS — E559 Vitamin D deficiency, unspecified: Secondary | ICD-10-CM

## 2013-07-30 DIAGNOSIS — E119 Type 2 diabetes mellitus without complications: Secondary | ICD-10-CM

## 2013-07-30 MED ORDER — ROSUVASTATIN CALCIUM 20 MG PO TABS: 20.0000 mg | ORAL_TABLET | Freq: Every evening | ORAL | Status: DC

## 2013-07-30 MED ORDER — VALSARTAN-HYDROCHLOROTHIAZIDE 320-25 MG PO TABS: 1.0000 | ORAL_TABLET | Freq: Every morning | ORAL | Status: DC

## 2013-07-30 NOTE — Patient Instructions (Addendum)
Dr Paula Libra Recommendations  For nutrition information, I recommend books:  1).Eat to Live by Dr Excell Seltzer. 2).Prevent and Reverse Heart Disease by Dr Karl Luke. 3) Dr Janene Harvey Book:  Program to Reverse Diabetes  Exercise recommendations are:  If unable to walk, then the patient can exercise in a chair 3 times a day. By flapping arms like a bird gently and raising legs outwards to the front.  If ambulatory, the patient can go for walks for 30 minutes 3 times a week. Then increase the intensity and duration as tolerated.  Goal is to try to attain exercise frequency to 5 times a week.  If applicable: Best to perform resistance exercises (machines or weights) 2 days a week and cardio type exercises 3 days per week.   DASH Diet DASH Diet The DASH diet stands for "Dietary Approaches to Stop Hypertension." It is a healthy eating plan that has been shown to reduce high blood pressure (hypertension) in as little as 14 days, while also possibly providing other significant health benefits. These other health benefits include reducing the risk of breast cancer after menopause and reducing the risk of type 2 diabetes, heart disease, colon cancer, and stroke. Health benefits also include weight loss and slowing kidney failure in patients with chronic kidney disease.  DIET GUIDELINES  Limit salt (sodium). Your diet should contain less than 1500 mg of sodium daily.  Limit refined or processed carbohydrates. Your diet should include mostly whole grains. Desserts and added sugars should be used sparingly.  Include small amounts of heart-healthy fats. These types of fats include nuts, oils, and tub margarine. Limit saturated and trans fats. These fats have been shown to be harmful in the body. CHOOSING FOODS  The following food groups are based on a 2000 calorie diet. See your Registered Dietitian for individual calorie needs. Grains and Grain Products (6 to 8 servings  daily)  Eat More Often: Whole-wheat bread, brown rice, whole-grain or wheat pasta, quinoa, popcorn without added fat or salt (air popped).  Eat Less Often: White bread, white pasta, white rice, cornbread. Vegetables (4 to 5 servings daily)  Eat More Often: Fresh, frozen, and canned vegetables. Vegetables may be raw, steamed, roasted, or grilled with a minimal amount of fat.  Eat Less Often/Avoid: Creamed or fried vegetables. Vegetables in a cheese sauce. Fruit (4 to 5 servings daily)  Eat More Often: All fresh, canned (in natural juice), or frozen fruits. Dried fruits without added sugar. One hundred percent fruit juice ( cup [237 mL] daily).  Eat Less Often: Dried fruits with added sugar. Canned fruit in light or heavy syrup. [employer], Fish, and Poultry (2 servings or less daily. One serving is 3 to 4 oz [85-114 g]).  Eat More Often: Ninety percent or leaner ground beef, tenderloin, sirloin. Round cuts of beef, chicken breast, [place] breast. All fish. Grill, bake, or broil your meat. Nothing should be fried.  Eat Less Often/Avoid: Fatty cuts of meat, [place], or chicken leg, thigh, or wing. Fried cuts of meat or fish. Dairy (2 to 3 servings)  Eat More Often: Low-fat or fat-free milk, low-fat plain or light yogurt, reduced-fat or part-skim cheese.  Eat Less Often/Avoid: Milk (whole, 2%).Whole milk yogurt. Full-fat cheeses. Nuts, Seeds, and Legumes (4 to 5 servings per week)  Eat More Often: All without added salt.  Eat Less Often/Avoid: Salted nuts and seeds, canned beans with added salt. Fats and Sweets (limited)  Eat More Often: Vegetable  oils, tub margarines without trans fats, sugar-free gelatin. Mayonnaise and salad dressings.  Eat Less Often/Avoid: Coconut oils, palm oils, butter, stick margarine, cream, half and half, cookies, candy, pie. FOR MORE INFORMATION The Dash Diet Eating Plan: www.dashdiet.org Document Released: 11/09/2011 Document Revised: 02/12/2012 Document  Reviewed: 11/09/2011 [facility] Patient Information 2014 [place], [place].   Diabetes and Foot Care Diabetes may cause you to have a poor blood supply (circulation) to your legs and feet. Because of this, the skin may be thinner, break easier, and heal more slowly. You also may have nerve damage in your legs and feet causing decreased feeling. You may not notice minor injuries to your feet that could lead to serious problems or infections. Taking care of your feet is one of the most important things you can do for yourself.  HOME CARE INSTRUCTIONS  Do not go barefoot. Bare feet are easily injured.  Check your feet daily for blisters, cuts, and redness.  Wash your feet with warm water (not hot) and mild soap. Pat your feet and between your toes until completely dry.  Apply a moisturizing lotion that does not contain alcohol or petroleum jelly to the dry skin on your feet and to dry brittle toenails. Do not put it between your toes.  Trim your toenails straight across. Do not dig under them or around the cuticle.  Do not cut corns or calluses, or try to remove them with medicine.  Wear clean cotton socks or stockings every day. Make sure they are not too tight. Do not wear knee high stockings since they may decrease blood flow to your legs.  Wear leather shoes that fit properly and have enough cushioning. To break in new shoes, wear them just a few hours a day to avoid injuring your feet.  Wear shoes at all times, even in the house.  Do not cross your legs. This may decrease the blood flow to your feet.  If you find a minor scrape, cut, or break in the skin on your feet, keep it and the skin around it clean and dry. These areas may be cleansed with mild soap and water. Do not use peroxide, alcohol, iodine or Merthiolate.  When you remove an adhesive bandage, be sure not to harm the skin around it.  If you have a wound, look at it several times a day to make sure it is healing.  Do not  use heating pads or hot water bottles. Burns can occur. If you have lost feeling in your feet or legs, you may not know it is happening until it is too late.  Report any cuts, sores or bruises to your caregiver. Do not wait! SEEK MEDICAL CARE IF:   You have an injury that is not healing or you notice redness, numbness, burning, or tingling.  Your feet always feel cold.  You have pain or cramps in your legs and feet. SEEK IMMEDIATE MEDICAL CARE IF:   There is increasing redness, swelling, or increasing pain in the wound.  There is a red line that goes up your leg.  Pus is coming from a wound.  You develop an unexplained oral temperature above 102 F (38.9 C), or as your caregiver suggests.  You notice a bad smell coming from an ulcer or wound. MAKE SURE YOU:   Understand these instructions.  Will watch your condition.  Will get help right away if you are not doing well or get worse. Document Released: 11/17/2000 Document Revised: 02/12/2012 Document Reviewed: 05/26/2009  ExitCare Patient Information 2014 [place].

## 2013-07-30 NOTE — Progress Notes (Signed)
Patient ID: [name], female   DOB: [birth year], 62 y.o.   MRN: [id number] SUBJECTIVE: CC: Chief Complaint  Patient presents with  . Follow-up    diabetes .saw dr nida for the diabetes   . Medication Refill    crestor and diovan    HPI: Patient is here for follow up of Diabetes Mellitus/HTN/HLD/vit D Deficiency Symptoms evaluated: Denies Nocturia ,Denies Urinary Frequency , denies Blurred vision ,deniesDizziness,denies.Dysuria,denies paresthesias, denies extremity pain or ulcers.Marland Kitchendenies chest pain. has had an annual eye exam. do check the feet. Does check CBGs. Average CBG:130s Denies episodes of hypoglycemia. Does have an emergency hypoglycemic plan. admits toCompliance with medications. Denies Problems with medications.  Had surgery with macroplastique for urinary incontinence. Doing good.  Had eye exam Past Medical History  Diagnosis Date  . Esophageal stricture   . Hyperlipidemia   . Hypertension   . Bronchitis   . CAD (coronary artery disease)     s/p Stenting in 2005;  [place] 7/09: Vigorous LV function, 2-3+ MR, RI 40%, RI stent patent, proximal-mid RCA 40-50%;  [place] 7/09:  EF 55-65%, trivial MR;  Myoview 11/18/12: EF 66%, normal LV wall motion, mild to moderate anterior ischemia - reviewed by [facility] and felt to rep breast atten (low risk);  Echo 6/14: mild LVH, EF 55-65%, Gr 2 DD, PASP 44, trivial eff   . Arthritis   . Chronic kidney disease     CREATININE IS UP--GOING TO KIDNEY MD   . Non-insulin dependent diabetes mellitus   . Diverticulosis   . Diabetic neuropathy   . Vitamin D deficiency   . Degenerative joint disease     left shoulder  . Chronic neck pain   . Shortness of breath     at night  . Headache(784.0)   . Anemia     hx of  . Stroke     "mini- years ago"   Past Surgical History  Procedure Laterality Date  . Cardiac catheterization  2003, 2005, 2009  . Bladder surgery      Tack  . Partial hysterectomy    . Shoulder surgery      Right  .  Cervical spine surgery  2012    8 screws  . Abdominal hysterectomy    . Laminectomy  12/29/2011  . Cardiac stents    . Lumbar laminectomy/decompression microdiscectomy  12/29/2011    Procedure: LUMBAR LAMINECTOMY/DECOMPRESSION MICRODISCECTOMY;  Surgeon: Floyce Stakes, MD;  Location: [place] [facility];  Service: Neurosurgery;  Laterality: N/A;  Lumbar Two through Lumbar Five Laminectomies/Cellsaver  . Back surgery      2007 & 2013  . Cystoscopy with injection N/A 06/03/2013    Procedure: MACROPLASTIQUE  INJECTION ;  Surgeon: Malka So, MD;  Location: [facility];  Service: Urology;  Laterality: N/A;   History   Social History  . Marital Status: Widowed    Spouse Name: N/A    Number of Children: N/A  . Years of Education: N/A   Occupational History  . Not on file.   Social History Main Topics  . Smoking status: Former Smoker -- 1.00 packs/day for 30 years    Quit date: 12/04/2001  . Smokeless tobacco: Never Used  . Alcohol Use: Yes     Comment: occasional  . Drug Use: No  . Sexual Activity: No   Other Topics Concern  . Not on file   Social History Narrative   Lives in [place]   Family History  Problem Relation Age of Onset  .  Heart disease Other    Current Outpatient Prescriptions on File Prior to Visit  Medication Sig Dispense Refill  . aspirin 81 MG EC tablet Take 81 mg by mouth daily.        [name] atenolol (TENORMIN) 50 MG tablet Take 50 mg by mouth 2 (two) times daily.      [name] gabapentin (NEURONTIN) 300 MG capsule Take 300 mg by mouth 3 (three) times daily.      . insulin detemir (LEVEMIR) 100 UNIT/ML injection Inject 45 Units into the skin daily.       . insulin lispro (HUMALOG) 100 UNIT/ML injection Inject 5 Units into the skin 2 (two) times daily before a meal.  10 mL  12  . NIFEdipine (PROCARDIA XL/ADALAT-CC) 90 MG 24 hr tablet Take 90 mg by mouth every evening.       . potassium chloride (K-DUR,KLOR-CON) 10 MEQ tablet Take 20 mEq by mouth daily.      . rosuvastatin  (CRESTOR) 20 MG tablet Take 20 mg by mouth every evening.      . valsartan-hydrochlorothiazide (DIOVAN-HCT) 320-25 MG per tablet Take 1 tablet by mouth every morning.        No current facility-administered medications on file prior to visit.   Allergies  Allergen Reactions  . Ace Inhibitors     unknown  . Clopidogrel Bisulfate     REACTION: Sick, Headache, "Felt terrible"  . Codeine     REACTION: hallucinations  . Penicillins     unknown   Immunization History  Administered Date(s) Administered  . DTaP 07/19/2010  . Influenza Whole 09/03/2006   Prior to Admission medications   Medication Sig Start Date End Date Taking? Authorizing Provider  linagliptin (TRADJENTA) 5 MG TABS tablet Take 5 mg by mouth daily.   Yes Historical Provider, MD  aspirin 81 MG EC tablet Take 81 mg by mouth daily.      Historical Provider, MD  atenolol (TENORMIN) 50 MG tablet Take 50 mg by mouth 2 (two) times daily.    Historical Provider, MD  gabapentin (NEURONTIN) 300 MG capsule Take 300 mg by mouth 3 (three) times daily.    Historical Provider, MD  insulin detemir (LEVEMIR) 100 UNIT/ML injection Inject 45 Units into the skin daily.     Historical Provider, MD  insulin lispro (HUMALOG) 100 UNIT/ML injection Inject 5 Units into the skin 2 (two) times daily before a meal. 05/27/13   Memory Argue, RPH  NIFEdipine (PROCARDIA XL/ADALAT-CC) 90 MG 24 hr tablet Take 90 mg by mouth every evening.     Historical Provider, MD  potassium chloride (K-DUR,KLOR-CON) 10 MEQ tablet Take 20 mEq by mouth daily.    Historical Provider, MD  rosuvastatin (CRESTOR) 20 MG tablet Take 20 mg by mouth every evening.    Historical Provider, MD  valsartan-hydrochlorothiazide (DIOVAN-HCT) 320-25 MG per tablet Take 1 tablet by mouth every morning.     Historical Provider, MD     ROS: As above in the HPI. All other systems are stable or negative.  OBJECTIVE: APPEARANCE:  Patient in no acute distress.The patient appeared well  nourished and normally developed. Acyanotic. Waist: VITAL SIGNS:BP 161/80  Pulse 79  Temp(Src) 97.2 F (36.2 C) (Oral)  Wt 218 lb 12.8 oz (99.247 kg)  BMI 40.01 kg/m2 bp 160/80 patient ran out of Diovan. AAF Obese  SKIN: warm and  Dry without overt rashes, tattoos and scars  HEAD and Neck: without JVD, Head and scalp: normal Eyes:No scleral icterus. Fundi normal,  eye movements normal. Ears: Auricle normal, canal normal, Tympanic membranes normal, insufflation normal. Nose: normal Throat: normal Neck & thyroid: normal  CHEST & LUNGS: Chest wall: normal Lungs: Clear  CVS: Reveals the PMI to be normally located. Regular rhythm, First and Second Heart sounds are normal,  absence of murmurs, rubs or gallops. Peripheral vasculature: Radial pulses: normal Dorsal pedis pulses: normal Posterior pulses: normal  ABDOMEN:  Appearance: obese Benign, no organomegaly, no masses, no Abdominal Aortic enlargement. No Guarding , no rebound. No Bruits. Bowel sounds: normal  RECTAL: N/A GU: N/A  EXTREMETIES: nonedematous.  MUSCULOSKELETAL:  Spine: normal Joints: intact  NEUROLOGIC: oriented to time,place and person; nonfocal. Strength is normal Sensory is normal Reflexes are normal Cranial Nerves are normal.  ASSESSMENT: CAD  CKD (chronic kidney disease)  Diabetic neuropathy  DM  HYPERLIPIDEMIA - Plan: rosuvastatin (CRESTOR) 20 MG tablet  HYPERTENSION, BENIGN - Plan: valsartan-hydrochlorothiazide (DIOVAN-HCT) 320-25 MG per tablet  Vitamin D deficiency    PLAN:      Dr Paula Libra Recommendations  For nutrition information, I recommend books:  1).Eat to Live by Dr Excell Seltzer. 2).Prevent and Reverse Heart Disease by Dr Karl Luke. 3) Dr Janene Harvey Book:  Program to Reverse Diabetes  Exercise recommendations are:  If unable to walk, then the patient can exercise in a chair 3 times a day. By flapping arms like a bird gently and raising legs  outwards to the front.  If ambulatory, the patient can go for walks for 30 minutes 3 times a week. Then increase the intensity and duration as tolerated.  Goal is to try to attain exercise frequency to 5 times a week.  If applicable: Best to perform resistance exercises (machines or weights) 2 days a week and cardio type exercises 3 days per week.   RTC in 2 weeks with her BP machine to check. Check BP daily.  Meds ordered this encounter  Medications  . linagliptin (TRADJENTA) 5 MG TABS tablet    Sig: Take 5 mg by mouth daily.  . rosuvastatin (CRESTOR) 20 MG tablet    Sig: Take 1 tablet (20 mg total) by mouth every evening.    Dispense:  30 tablet    Refill:  11  . valsartan-hydrochlorothiazide (DIOVAN-HCT) 320-25 MG per tablet    Sig: Take 1 tablet by mouth every morning.    Dispense:  30 tablet    Refill:  11    DASH DIET. DM foot check.  Reviewed labs from Dr Dorris Fetch.  Return in about 3 months (around 10/30/2013) for Recheck medical problems.  Tyiana Hill P. Jacelyn Grip, M.D.

## 2013-08-01 ENCOUNTER — Ambulatory Visit: Payer: Self-pay | Admitting: Family Medicine

## 2013-08-13 ENCOUNTER — Ambulatory Visit: Payer: BC Managed Care – PPO | Admitting: *Deleted

## 2013-08-13 ENCOUNTER — Telehealth: Payer: Self-pay | Admitting: *Deleted

## 2013-08-13 VITALS — BP 137/76 | HR 61

## 2013-08-13 DIAGNOSIS — Z0000 Encounter for general adult medical examination without abnormal findings: Secondary | ICD-10-CM

## 2013-08-13 NOTE — Progress Notes (Signed)
Patient ID: [name], female   DOB: [birth year], 62 y.o.   MRN: [id number] PT CAME IN FOR BP CHECK PER DR. Jacelyn Grip. BP RESULTS SENT TO DR. Jacelyn Grip

## 2013-08-13 NOTE — Telephone Encounter (Signed)
Message copied by Marin Olp on Wed Aug 13, 2013  2:09 PM ------      Message from: Vernie Shanks      Created: Wed Aug 13, 2013 11:25 AM                   ----- Message -----         From: Priscille Heidelberg, RN         Sent: 08/13/2013  10:41 AM           To: Vernie Shanks, MD             ------

## 2013-08-13 NOTE — Progress Notes (Signed)
BP better. Dash/low salt diet, exercise. Keep follow up with Dr Dorris Fetch the Endocrinologist and [facility] follow up as planned. Rashel Okeefe P. Jacelyn Grip, M.D.

## 2013-09-11 ENCOUNTER — Ambulatory Visit (INDEPENDENT_AMBULATORY_CARE_PROVIDER_SITE_OTHER): Payer: Medicare Other

## 2013-09-11 DIAGNOSIS — Z23 Encounter for immunization: Secondary | ICD-10-CM

## 2013-09-20 ENCOUNTER — Telehealth: Payer: Self-pay | Admitting: Cardiology

## 2013-09-21 NOTE — Telephone Encounter (Signed)
Error

## 2013-09-29 ENCOUNTER — Ambulatory Visit (INDEPENDENT_AMBULATORY_CARE_PROVIDER_SITE_OTHER): Payer: Medicare Other | Admitting: Family Medicine

## 2013-09-29 ENCOUNTER — Ambulatory Visit (INDEPENDENT_AMBULATORY_CARE_PROVIDER_SITE_OTHER): Payer: Medicare Other

## 2013-09-29 ENCOUNTER — Encounter (HOSPITAL_COMMUNITY): Payer: Self-pay | Admitting: Emergency Medicine

## 2013-09-29 ENCOUNTER — Inpatient Hospital Stay (HOSPITAL_COMMUNITY)
Admission: EM | Admit: 2013-09-29 | Discharge: 2013-10-02 | DRG: 291 | Disposition: A | Discharge status: Home / Self Care | Payer: Medicare Other | Attending: Internal Medicine | Admitting: Internal Medicine

## 2013-09-29 VITALS — BP 147/83 | HR 83 | Temp 98.5°F | Ht 62.0 in | Wt 219.0 lb

## 2013-09-29 DIAGNOSIS — N189 Chronic kidney disease, unspecified: Secondary | ICD-10-CM | POA: Diagnosis present

## 2013-09-29 DIAGNOSIS — I5031 Acute diastolic (congestive) heart failure: Secondary | ICD-10-CM | POA: Diagnosis present

## 2013-09-29 DIAGNOSIS — E1121 Type 2 diabetes mellitus with diabetic nephropathy: Secondary | ICD-10-CM | POA: Diagnosis present

## 2013-09-29 DIAGNOSIS — M542 Cervicalgia: Secondary | ICD-10-CM

## 2013-09-29 DIAGNOSIS — E1142 Type 2 diabetes mellitus with diabetic polyneuropathy: Secondary | ICD-10-CM

## 2013-09-29 DIAGNOSIS — R9389 Abnormal findings on diagnostic imaging of other specified body structures: Secondary | ICD-10-CM

## 2013-09-29 DIAGNOSIS — Z8673 Personal history of transient ischemic attack (TIA), and cerebral infarction without residual deficits: Secondary | ICD-10-CM

## 2013-09-29 DIAGNOSIS — Z794 Long term (current) use of insulin: Secondary | ICD-10-CM

## 2013-09-29 DIAGNOSIS — M19019 Primary osteoarthritis, unspecified shoulder: Secondary | ICD-10-CM | POA: Diagnosis present

## 2013-09-29 DIAGNOSIS — I2510 Atherosclerotic heart disease of native coronary artery without angina pectoris: Secondary | ICD-10-CM

## 2013-09-29 DIAGNOSIS — I10 Essential (primary) hypertension: Secondary | ICD-10-CM

## 2013-09-29 DIAGNOSIS — E1159 Type 2 diabetes mellitus with other circulatory complications: Secondary | ICD-10-CM | POA: Diagnosis present

## 2013-09-29 DIAGNOSIS — E119 Type 2 diabetes mellitus without complications: Secondary | ICD-10-CM

## 2013-09-29 DIAGNOSIS — E1149 Type 2 diabetes mellitus with other diabetic neurological complication: Secondary | ICD-10-CM

## 2013-09-29 DIAGNOSIS — R0609 Other forms of dyspnea: Secondary | ICD-10-CM

## 2013-09-29 DIAGNOSIS — Z9861 Coronary angioplasty status: Secondary | ICD-10-CM

## 2013-09-29 DIAGNOSIS — E1140 Type 2 diabetes mellitus with diabetic neuropathy, unspecified: Secondary | ICD-10-CM

## 2013-09-29 DIAGNOSIS — E559 Vitamin D deficiency, unspecified: Secondary | ICD-10-CM

## 2013-09-29 DIAGNOSIS — E785 Hyperlipidemia, unspecified: Secondary | ICD-10-CM

## 2013-09-29 DIAGNOSIS — E1169 Type 2 diabetes mellitus with other specified complication: Secondary | ICD-10-CM | POA: Diagnosis present

## 2013-09-29 DIAGNOSIS — R0600 Dyspnea, unspecified: Secondary | ICD-10-CM | POA: Diagnosis present

## 2013-09-29 DIAGNOSIS — I509 Heart failure, unspecified: Secondary | ICD-10-CM

## 2013-09-29 DIAGNOSIS — Z87891 Personal history of nicotine dependence: Secondary | ICD-10-CM

## 2013-09-29 DIAGNOSIS — T502X5A Adverse effect of carbonic-anhydrase inhibitors, benzothiadiazides and other diuretics, initial encounter: Secondary | ICD-10-CM | POA: Diagnosis present

## 2013-09-29 DIAGNOSIS — Z8249 Family history of ischemic heart disease and other diseases of the circulatory system: Secondary | ICD-10-CM

## 2013-09-29 DIAGNOSIS — I5033 Acute on chronic diastolic (congestive) heart failure: Principal | ICD-10-CM | POA: Diagnosis present

## 2013-09-29 DIAGNOSIS — E876 Hypokalemia: Secondary | ICD-10-CM

## 2013-09-29 DIAGNOSIS — J189 Pneumonia, unspecified organism: Secondary | ICD-10-CM | POA: Diagnosis present

## 2013-09-29 DIAGNOSIS — J40 Bronchitis, not specified as acute or chronic: Secondary | ICD-10-CM

## 2013-09-29 DIAGNOSIS — R918 Other nonspecific abnormal finding of lung field: Secondary | ICD-10-CM

## 2013-09-29 DIAGNOSIS — I25118 Atherosclerotic heart disease of native coronary artery with other forms of angina pectoris: Secondary | ICD-10-CM | POA: Diagnosis present

## 2013-09-29 DIAGNOSIS — K5900 Constipation, unspecified: Secondary | ICD-10-CM

## 2013-09-29 DIAGNOSIS — I129 Hypertensive chronic kidney disease with stage 1 through stage 4 chronic kidney disease, or unspecified chronic kidney disease: Secondary | ICD-10-CM | POA: Diagnosis present

## 2013-09-29 LAB — BASIC METABOLIC PANEL
BUN: 26 mg/dL — ABNORMAL HIGH (ref 6–23)
CO2: 31 mEq/L (ref 19–32)
Calcium: 10 mg/dL (ref 8.4–10.5)
Chloride: 97 mEq/L (ref 96–112)
Creatinine, Ser: 1.97 mg/dL — ABNORMAL HIGH (ref 0.50–1.10)
GFR calc Af Amer: 30 mL/min — ABNORMAL LOW (ref >90)
GFR calc non Af Amer: 26 mL/min — ABNORMAL LOW (ref >90)
Glucose, Bld: 198 mg/dL — ABNORMAL HIGH (ref 70–99)
Potassium: 3.3 mEq/L — ABNORMAL LOW (ref 3.5–5.1)
Sodium: 139 mEq/L (ref 135–145)

## 2013-09-29 LAB — CBC WITH DIFFERENTIAL/PLATELET
Basophils Absolute: 0 10*3/uL (ref 0.0–0.1)
Basophils Relative: 0 % (ref 0–1)
Eosinophils Absolute: 0.5 10*3/uL (ref 0.0–0.7)
Eosinophils Relative: 3 % (ref 0–5)
HCT: 36.7 % (ref 36.0–46.0)
Hemoglobin: 12.2 g/dL (ref 12.0–15.0)
Lymphocytes Relative: 18 % (ref 12–46)
Lymphs Abs: 3.1 10*3/uL (ref 0.7–4.0)
MCH: 27.6 pg (ref 26.0–34.0)
MCHC: 33.2 g/dL (ref 30.0–36.0)
MCV: 83 fL (ref 78.0–100.0)
Monocytes Absolute: 0.9 10*3/uL (ref 0.1–1.0)
Monocytes Relative: 5 % (ref 3–12)
Neutro Abs: 12.7 10*3/uL — ABNORMAL HIGH (ref 1.7–7.7)
Neutrophils Relative %: 74 % (ref 43–77)
Platelets: 320 10*3/uL (ref 150–400)
RBC: 4.42 MIL/uL (ref 3.87–5.11)
RDW: 15.5 % (ref 11.5–15.5)
WBC: 17.3 10*3/uL — ABNORMAL HIGH (ref 4.0–10.5)

## 2013-09-29 LAB — TROPONIN I
Troponin I: <0.3 ng/mL (ref <0.30)
Troponin I: <0.3 ng/mL (ref <0.30)

## 2013-09-29 LAB — D-DIMER, QUANTITATIVE (NOT AT ARMC): D-Dimer, Quant: 1.16 ug/mL-FEU — ABNORMAL HIGH (ref 0.00–0.48)

## 2013-09-29 LAB — GLUCOSE, CAPILLARY: Glucose-Capillary: 174 mg/dL — ABNORMAL HIGH (ref 70–99)

## 2013-09-29 LAB — PRO B NATRIURETIC PEPTIDE: Pro B Natriuretic peptide (BNP): 1856 pg/mL — ABNORMAL HIGH (ref 0–125)

## 2013-09-29 MED ORDER — LEVOFLOXACIN IN D5W 750 MG/150ML IV SOLN: 750.0000 mg | INTRAVENOUS | Status: DC

## 2013-09-29 MED ORDER — ALBUTEROL SULFATE (5 MG/ML) 0.5% IN NEBU
5.0000 mg | INHALATION_SOLUTION | Freq: Once | RESPIRATORY_TRACT | Status: AC
  Administered 2013-09-29: 5 mg via RESPIRATORY_TRACT
  Filled 2013-09-29: qty 1

## 2013-09-29 MED ORDER — LEVOFLOXACIN IN D5W 750 MG/150ML IV SOLN
750.0000 mg | Freq: Once | INTRAVENOUS | Status: AC
  Administered 2013-09-29: 750 mg via INTRAVENOUS
  Filled 2013-09-29: qty 150

## 2013-09-29 MED ORDER — ENOXAPARIN SODIUM 40 MG/0.4ML ~~LOC~~ SOLN
40.0000 mg | SUBCUTANEOUS | Status: DC
  Administered 2013-09-29 – 2013-10-01 (×3): 40 mg via SUBCUTANEOUS
  Filled 2013-09-29 (×3): qty 0.4

## 2013-09-29 MED ORDER — SODIUM CHLORIDE 0.9 % IV SOLN: 250.0000 mL | INTRAVENOUS | Status: DC | PRN

## 2013-09-29 MED ORDER — INSULIN DETEMIR 100 UNIT/ML ~~LOC~~ SOLN
60.0000 [IU] | Freq: Every day | SUBCUTANEOUS | Status: DC
  Administered 2013-09-29 – 2013-09-30 (×2): 60 [IU] via SUBCUTANEOUS
  Filled 2013-09-29 (×3): qty 0.6

## 2013-09-29 MED ORDER — SODIUM CHLORIDE 0.9 % IJ SOLN
3.0000 mL | Freq: Two times a day (BID) | INTRAMUSCULAR | Status: DC
  Administered 2013-09-29 – 2013-10-02 (×6): 3 mL via INTRAVENOUS

## 2013-09-29 MED ORDER — ACETAMINOPHEN 650 MG RE SUPP: 650.0000 mg | Freq: Four times a day (QID) | RECTAL | Status: DC | PRN

## 2013-09-29 MED ORDER — ATORVASTATIN CALCIUM 40 MG PO TABS
40.0000 mg | ORAL_TABLET | Freq: Every day | ORAL | Status: DC
  Administered 2013-09-29 – 2013-10-01 (×3): 40 mg via ORAL
  Filled 2013-09-29 (×3): qty 1

## 2013-09-29 MED ORDER — NIFEDIPINE ER OSMOTIC RELEASE 30 MG PO TB24
90.0000 mg | ORAL_TABLET | Freq: Every evening | ORAL | Status: DC
  Administered 2013-09-29 – 2013-10-01 (×3): 90 mg via ORAL
  Filled 2013-09-29 (×3): qty 3

## 2013-09-29 MED ORDER — GABAPENTIN 300 MG PO CAPS
300.0000 mg | ORAL_CAPSULE | Freq: Three times a day (TID) | ORAL | Status: DC
  Administered 2013-09-29 – 2013-10-02 (×8): 300 mg via ORAL
  Filled 2013-09-29 (×8): qty 1

## 2013-09-29 MED ORDER — INSULIN ASPART 100 UNIT/ML ~~LOC~~ SOLN
0.0000 [IU] | Freq: Three times a day (TID) | SUBCUTANEOUS | Status: DC
  Administered 2013-09-30 (×2): 5 [IU] via SUBCUTANEOUS
  Administered 2013-10-01: 8 [IU] via SUBCUTANEOUS

## 2013-09-29 MED ORDER — ATENOLOL 25 MG PO TABS
50.0000 mg | ORAL_TABLET | Freq: Two times a day (BID) | ORAL | Status: DC
  Administered 2013-09-29 – 2013-10-02 (×6): 50 mg via ORAL
  Filled 2013-09-29 (×6): qty 2

## 2013-09-29 MED ORDER — ACETAMINOPHEN 325 MG PO TABS
650.0000 mg | ORAL_TABLET | Freq: Four times a day (QID) | ORAL | Status: DC | PRN
  Administered 2013-10-01: 650 mg via ORAL
  Filled 2013-09-29: qty 2

## 2013-09-29 MED ORDER — SODIUM CHLORIDE 0.9 % IJ SOLN: 3.0000 mL | INTRAMUSCULAR | Status: DC | PRN

## 2013-09-29 MED ORDER — FUROSEMIDE 10 MG/ML IJ SOLN
20.0000 mg | Freq: Two times a day (BID) | INTRAMUSCULAR | Status: DC
  Filled 2013-09-29: qty 2

## 2013-09-29 MED ORDER — ASPIRIN EC 81 MG PO TBEC
81.0000 mg | DELAYED_RELEASE_TABLET | Freq: Every day | ORAL | Status: DC
  Administered 2013-09-30 – 2013-10-02 (×3): 81 mg via ORAL
  Filled 2013-09-29 (×3): qty 1

## 2013-09-29 MED ORDER — IPRATROPIUM BROMIDE 0.02 % IN SOLN
0.5000 mg | Freq: Once | RESPIRATORY_TRACT | Status: AC
  Administered 2013-09-29: 0.5 mg via RESPIRATORY_TRACT
  Filled 2013-09-29: qty 2.5

## 2013-09-29 MED ORDER — POTASSIUM CHLORIDE CRYS ER 10 MEQ PO TBCR
20.0000 meq | EXTENDED_RELEASE_TABLET | Freq: Every day | ORAL | Status: DC
  Administered 2013-09-30 – 2013-10-02 (×3): 20 meq via ORAL
  Filled 2013-09-29 (×5): qty 2

## 2013-09-29 NOTE — H&P (Signed)
PCP:   Anthoney Harada, MD   Chief Complaint:  Shortness of breath  HPI: 62 year old female with a history of CAD status post ending in 2005, diabetes mellitus, hypertension, hyperlipidemia who came to the ED with worsening shortness of breath which has been ongoing for the past 4 days. Patient took trip to [place], and started experiencing shortness of breath last Thursday. Patient says that the shortness of breath was worse on lying down and on exertion, she denies cough with phlegm, she did have a low-grade temperature last night. Denies any chills, no nausea vomiting no diarrhea. No dysuria urgency or frequency of urination. Patient had a 2-D echocardiogram done in June 2014 which showed grade 2 diastolic dysfunction with EF 55-65%. Today her BNP is elevated to 1856, her last BNP in June 2014 was 69.0. Chest x-ray shows possible new infiltrate and she has orally received one dose of Levaquin for the community acquired pneumonia.  Allergies:   Allergies  Allergen Reactions  . Ace Inhibitors     unknown  . Clopidogrel Bisulfate     REACTION: Sick, Headache, "Felt terrible"  . Codeine Other (See Comments)    REACTION: hallucinations  . Penicillins     unknown      Past Medical History  Diagnosis Date  . Esophageal stricture   . Hyperlipidemia   . Hypertension   . Bronchitis   . CAD (coronary artery disease)     s/p Stenting in 2005;  [place] 7/09: Vigorous LV function, 2-3+ MR, RI 40%, RI stent patent, proximal-mid RCA 40-50%;  E[place] 7/09:  EF 55-65%, trivial MR;  Myoview 11/18/12: EF 66%, normal LV wall motion, mild to moderate anterior ischemia - reviewed by [facility] and felt to rep breast atten (low risk);  Echo 6/14: mild LVH, EF 55-65%, Gr 2 DD, PASP 44, trivial eff   . Arthritis   . Non-insulin dependent diabetes mellitus   . Diverticulosis   . Diabetic neuropathy   . Vitamin D deficiency   . Degenerative joint disease     left shoulder  . Chronic neck pain   . Shortness of  breath     at night  . Headache(784.0)   . Anemia     hx of  . Stroke     "mini- years ago"  . Chronic kidney disease     CREATININE IS UP--GOING TO KIDNEY MD     Past Surgical History  Procedure Laterality Date  . Bladder surgery      Tack  . Partial hysterectomy    . Shoulder surgery      Right  . Cervical spine surgery  2012    8 screws  . Abdominal hysterectomy    . Laminectomy  12/29/2011  . Cardiac stents    . Lumbar laminectomy/decompression microdiscectomy  12/29/2011    Procedure: LUMBAR LAMINECTOMY/DECOMPRESSION MICRODISCECTOMY;  Surgeon: Floyce Stakes, MD;  Location: [place] [facility];  Service: Neurosurgery;  Laterality: N/A;  Lumbar Two through Lumbar Five Laminectomies/Cellsaver  . Back surgery      2007 & 2013  . Cystoscopy with injection N/A 06/03/2013    Procedure: MACROPLASTIQUE  INJECTION ;  Surgeon: Malka So, MD;  Location: [facility];  Service: Urology;  Laterality: N/A;  . Cardiac catheterization  2003, 2005, 2009    Prior to Admission medications   Medication Sig Start Date End Date Taking? Authorizing Provider  albuterol (PROVENTIL HFA;VENTOLIN HFA) 108 (90 BASE) MCG/ACT inhaler Inhale 2 puffs into the lungs every 6 (  six) hours as needed for wheezing.   Yes Historical Provider, MD  aspirin 81 MG EC tablet Take 81 mg by mouth daily.     Yes Historical Provider, MD  atenolol (TENORMIN) 50 MG tablet Take 50 mg by mouth 2 (two) times daily.   Yes Historical Provider, MD  cholecalciferol (VITAMIN D) 1000 UNITS tablet Take 1,000 Units by mouth every morning.   Yes Historical Provider, MD  gabapentin (NEURONTIN) 300 MG capsule Take 300 mg by mouth 3 (three) times daily.   Yes Historical Provider, MD  insulin detemir (LEVEMIR) 100 UNIT/ML injection Inject 60 Units into the skin at bedtime.    Yes Historical Provider, MD  insulin lispro (HUMALOG) 100 UNIT/ML injection Inject into the skin See admin instructions. To be administered as directed per sliding  scale: 90-150= 10  Units 151-200=11 units 201-250=12 units 251-300= 13 units  To call MD if levels are between 251-300 for three consecutive days   Yes Historical Provider, MD  linagliptin (TRADJENTA) 5 MG TABS tablet Take 5 mg by mouth daily.   Yes Historical Provider, MD  NIFEdipine (PROCARDIA XL/ADALAT-CC) 90 MG 24 hr tablet Take 90 mg by mouth every evening.    Yes Historical Provider, MD  potassium chloride (K-DUR) 10 MEQ tablet Take 20 mEq by mouth daily.   Yes Historical Provider, MD  rosuvastatin (CRESTOR) 20 MG tablet Take 1 tablet (20 mg total) by mouth every evening. 07/30/13  Yes Vernie Shanks, MD  valsartan-hydrochlorothiazide (DIOVAN-HCT) 320-25 MG per tablet Take 1 tablet by mouth every morning. 07/30/13  Yes Vernie Shanks, MD    Social History:  reports that she quit smoking about 11 years ago. She has never used smokeless tobacco. She reports that she drinks alcohol. She reports that she does not use illicit drugs.  Family History  Problem Relation Age of Onset  . Heart disease Other      All the positives are listed in BOLD  Review of Systems:  HEENT: Headache, blurred vision, runny nose, sore throat Neck: Hypothyroidism, hyperthyroidism,,lymphadenopathy Chest : Shortness of breath, history of COPD, Asthma Heart : Chest pain, history of coronary arterey disease GI:  Nausea, vomiting, diarrhea, constipation, GERD GU: Dysuria, urgency, frequency of urination, hematuria Neuro: Stroke, seizures, syncope Psych: Depression, anxiety, hallucinations   Physical Exam: Blood pressure 159/65, pulse 93, temperature 98.4 F (36.9 C), temperature source Oral, resp. rate 22, height 5\' 2"  (1.575 m), weight 97.977 kg (216 lb), SpO2 95.00%. Constitutional:   Patient is a well-developed and well-nourished female in no acute distress and cooperative with exam. Head: Normocephalic and atraumatic Mouth: Mucus membranes moist Eyes: PERRL, EOMI, conjunctivae normal Neck: Supple, No  Thyromegaly Cardiovascular: RRR, S1 normal, S2 normal Pulmonary/Chest: Bilateral crackles auscultated Abdominal: Soft. Non-tender, non-distended, bowel sounds are normal, no masses, organomegaly, or guarding present.  Neurological: A&O x3, Strenght is normal and symmetric bilaterally, cranial nerve II-XII are grossly intact, no focal motor deficit, sensory intact to light touch bilaterally.  Extremities : No Cyanosis, Clubbing or Edema   Labs on Admission:  Results for orders placed during the hospital encounter of 09/29/13 (from the past 48 hour(s))  TROPONIN I     Status: None   Collection Time    09/29/13  6:00 PM      Result Value Range   Troponin I <0.30  <0.30 ng/mL   Comment:            Due to the release kinetics of cTnI,  a negative result within the first hours     of the onset of symptoms does not rule out     myocardial infarction with certainty.     If myocardial infarction is still suspected,     repeat the test at appropriate intervals.  CBC WITH DIFFERENTIAL     Status: Abnormal   Collection Time    09/29/13  6:00 PM      Result Value Range   WBC 17.3 (*) 4.0 - 10.5 K/uL   RBC 4.42  3.87 - 5.11 MIL/uL   Hemoglobin 12.2  12.0 - 15.0 g/dL   HCT 36.7  36.0 - 46.0 %   MCV 83.0  78.0 - 100.0 fL   MCH 27.6  26.0 - 34.0 pg   MCHC 33.2  30.0 - 36.0 g/dL   RDW 15.5  11.5 - 15.5 %   Platelets 320  150 - 400 K/uL   Neutrophils Relative % 74  43 - 77 %   Neutro Abs 12.7 (*) 1.7 - 7.7 K/uL   Lymphocytes Relative 18  12 - 46 %   Lymphs Abs 3.1  0.7 - 4.0 K/uL   Monocytes Relative 5  3 - 12 %   Monocytes Absolute 0.9  0.1 - 1.0 K/uL   Eosinophils Relative 3  0 - 5 %   Eosinophils Absolute 0.5  0.0 - 0.7 K/uL   Basophils Relative 0  0 - 1 %   Basophils Absolute 0.0  0.0 - 0.1 K/uL  BASIC METABOLIC PANEL     Status: Abnormal   Collection Time    09/29/13  6:00 PM      Result Value Range   Sodium 139  135 - 145 mEq/L   Potassium 3.3 (*) 3.5 - 5.1 mEq/L   Chloride  97  96 - 112 mEq/L   CO2 31  19 - 32 mEq/L   Glucose, Bld 198 (*) 70 - 99 mg/dL   BUN 26 (*) 6 - 23 mg/dL   Creatinine, Ser 1.97 (*) 0.50 - 1.10 mg/dL   Calcium 10.0  8.4 - 10.5 mg/dL   GFR calc non Af Amer 26 (*) >90 mL/min   GFR calc Af Amer 30 (*) >90 mL/min   Comment: (NOTE)     The eGFR has been calculated using the CKD EPI equation.     This calculation has not been validated in all clinical situations.     eGFR's persistently <90 mL/min signify possible Chronic Kidney     Disease.  PRO B NATRIURETIC PEPTIDE     Status: Abnormal   Collection Time    09/29/13  6:00 PM      Result Value Range   Pro B Natriuretic peptide (BNP) 1856.0 (*) 0 - 125 pg/mL    Radiological Exams on Admission: Dg Chest 2 View  09/29/2013   CLINICAL DATA:  Shortness of breath  EXAM: CHEST  2 VIEW  COMPARISON:  05/28/2013  FINDINGS: Cardiac shadow is stable. Postsurgical changes are again seen. Increasing density is noted in the right lower and middle lobes consistent with acute pneumonic infiltrate. The left lung is clear.  IMPRESSION: New right mid and lower lung infiltrate.   Electronically Signed   By: Inez Catalina M.D.   On: 09/29/2013 15:35    Assessment/Plan Active Problems:   DM   CAD   CKD (chronic kidney disease)   Acute diastolic CHF (congestive heart failure)   Community acquired pneumonia  62 year old female with history  of CAD and a grade 2 diastolic dysfunction, now being admitted with acute diastolic heart failure and pneumonia. We'll start the patient on Lasix 20 mg IV every 12 hours, and also continue on Levaquin for the pneumonia. Will monitor the intake and output and daily weights, will obtain the blood cultures. Patient does have C. KD stage III, and her creatinine is at baseline. We'll continue sliding scale insulin and levemir for diabetes mellitus. Since patient went on long trip, pulmonary embolism is always a differential diagnosis, though patient is coming with classical signs  and symptoms of CHF exacerbation. Will obtain d-dimer, if positive will obtain a ventilation perfusion scan as CTAcannot be done due to C KD.  Code status: Patient is full code  Family discussion: Discussed with daughter at bedside   Time Spent on Admission: 75 min  [facility] Pager: [phone] 09/29/2013, 9:57 PM  If 7PM-7AM, please contact night-coverage  [email or web address]  Password [email or web address]

## 2013-09-29 NOTE — ED Provider Notes (Signed)
CSN: [id number]     Arrival date & time 09/29/13  1654 History   First MD Initiated Contact with Patient 09/29/13 1757    Scribed for Mariea Clonts, MD, the patient was seen in room APA05/APA05. This chart was scribed by Denice Bors, ED scribe. Patient's care was started at 6:25 PM  Chief Complaint  Patient presents with  . Shortness of Breath   (Consider location/radiation/quality/duration/timing/severity/associated sxs/prior Treatment) The history is provided by the patient. No language interpreter was used.   HPI Comments: [name] is a 62 y.o. female who presents to the Emergency Department with PMHx of CAD, HTN, orthopnea, and HLD complaining of constant moderate shortness of breath onset today. Reports associated orthopnea, and edema in lower extremities. Reports weight gain about 20 pounds in the last several months. Denies any aggravating or alleviating factors. Denies associated cough, chest pain, emesis, nausea, weakness, and fever. Denies PMHx of COPD. Reports PMHx of chronic neck pain. Denies recent long travel or extended immobility, prior history of DVT or PE, unilateral leg swelling or pain, smoking cigarettes, hemoptysis, active cancer, or estrogen (birth control) use.   Reports recent negative stress test. Dr. Percival Spanish is cardiologist     Past Medical History  Diagnosis Date  . Esophageal stricture   . Hyperlipidemia   . Hypertension   . Bronchitis   . CAD (coronary artery disease)     s/p Stenting in 2005;  [place] 7/09: Vigorous LV function, 2-3+ MR, RI 40%, RI stent patent, proximal-mid RCA 40-50%;  [place] 7/09:  EF 55-65%, trivial MR;  Myoview 11/18/12: EF 66%, normal LV wall motion, mild to moderate anterior ischemia - reviewed by [facility] and felt to rep breast atten (low risk);  Echo 6/14: mild LVH, EF 55-65%, Gr 2 DD, PASP 44, trivial eff   . Arthritis   . Non-insulin dependent diabetes mellitus   . Diverticulosis   . Diabetic neuropathy   . Vitamin D  deficiency   . Degenerative joint disease     left shoulder  . Chronic neck pain   . Shortness of breath     at night  . Headache(784.0)   . Anemia     hx of  . Stroke     "mini- years ago"  . Chronic kidney disease     CREATININE IS UP--GOING TO KIDNEY MD    Past Surgical History  Procedure Laterality Date  . Bladder surgery      Tack  . Partial hysterectomy    . Shoulder surgery      Right  . Cervical spine surgery  2012    8 screws  . Abdominal hysterectomy    . Laminectomy  12/29/2011  . Cardiac stents    . Lumbar laminectomy/decompression microdiscectomy  12/29/2011    Procedure: LUMBAR LAMINECTOMY/DECOMPRESSION MICRODISCECTOMY;  Surgeon: Floyce Stakes, MD;  Location: [place] [facility];  Service: Neurosurgery;  Laterality: N/A;  Lumbar Two through Lumbar Five Laminectomies/Cellsaver  . Back surgery      2007 & 2013  . Cystoscopy with injection N/A 06/03/2013    Procedure: MACROPLASTIQUE  INJECTION ;  Surgeon: Malka So, MD;  Location: [facility];  Service: Urology;  Laterality: N/A;  . Cardiac catheterization  2003, 2005, 2009   Family History  Problem Relation Age of Onset  . Heart disease Other    History  Substance Use Topics  . Smoking status: Former Smoker -- 1.00 packs/day for 30 years    Quit date: 12/04/2001  .  Smokeless tobacco: Never Used  . Alcohol Use: Yes     Comment: occasional   OB History   Grav Para Term Preterm Abortions TAB SAB Ect Mult Living                 Review of Systems  Constitutional: Negative for fever and chills.  Eyes: Negative for visual disturbance.  Respiratory: Positive for shortness of breath.   Cardiovascular: Negative for chest pain.  Gastrointestinal: Negative for vomiting and abdominal pain.  Genitourinary: Negative for dysuria and flank pain.  Musculoskeletal: Negative for back pain, neck pain and neck stiffness.  Skin: Negative for rash.  Neurological: Negative for light-headedness and headaches.  All other systems  reviewed and are negative.  A complete 10 system review of systems was obtained and all systems are negative except as noted in the HPI and PMHx.     Allergies  Ace inhibitors; Clopidogrel bisulfate; Codeine; and Penicillins  Home Medications   Current Outpatient Rx  Name  Route  Sig  Dispense  Refill  . aspirin 81 MG EC tablet   Oral   Take 81 mg by mouth daily.           [name] atenolol (TENORMIN) 50 MG tablet   Oral   Take 50 mg by mouth 2 (two) times daily.         [name] gabapentin (NEURONTIN) 300 MG capsule   Oral   Take 300 mg by mouth 3 (three) times daily.         . insulin detemir (LEVEMIR) 100 UNIT/ML injection   Subcutaneous   Inject 45 Units into the skin daily.          . insulin lispro (HUMALOG) 100 UNIT/ML injection   Subcutaneous   Inject 5 Units into the skin 2 (two) times daily before a meal.   10 mL   12   . linagliptin (TRADJENTA) 5 MG TABS tablet   Oral   Take 5 mg by mouth daily.         [name] NIFEdipine (PROCARDIA XL/ADALAT-CC) 90 MG 24 hr tablet   Oral   Take 90 mg by mouth every evening.          . potassium chloride (K-DUR,KLOR-CON) 10 MEQ tablet   Oral   Take 20 mEq by mouth daily.         . rosuvastatin (CRESTOR) 20 MG tablet   Oral   Take 1 tablet (20 mg total) by mouth every evening.   30 tablet   11   . valsartan-hydrochlorothiazide (DIOVAN-HCT) 320-25 MG per tablet   Oral   Take 1 tablet by mouth every morning.   30 tablet   11    BP 162/91  Pulse 93  Temp(Src) 98.4 F (36.9 C) (Oral)  Resp 24  Ht 5\' 2"  (1.575 m)  Wt 216 lb (97.977 kg)  BMI 39.5 kg/m2  SpO2 92% Physical Exam  Nursing note and vitals reviewed. Constitutional: She is oriented to person, place, and time. She appears well-developed and well-nourished.  HENT:  Head: Normocephalic and atraumatic.  Eyes: Conjunctivae and EOM are normal. Pupils are equal, round, and reactive to light. Right eye exhibits no discharge. Left eye exhibits no discharge.   Neck: Normal range of motion. Neck supple. No tracheal deviation present.  Cardiovascular: Normal rate and regular rhythm.   No murmur heard. Pulmonary/Chest: Effort normal. She has wheezes. She has rales.  Mild upper expiratory wheezes and mild crackles at the bases  Abdominal: Soft. She exhibits no distension. There is no tenderness. There is no rebound and no guarding.  Musculoskeletal: She exhibits edema.  Mild lower extremities edema  Neurological: She is alert and oriented to person, place, and time.  Skin: Skin is warm. No rash noted.  Psychiatric: She has a normal mood and affect.    ED Course  Procedures (including critical care time) COORDINATION OF CARE:  Nursing notes reviewed. Vital signs reviewed. Initial pt interview and examination performed.   6:31 PM-Discussed work up plan with pt at bedside, which includes EKG, BNP, Troponin, CBC with diff panel, and BMP. Pt agrees with plan.  9:00 PM Consulted with Dr. Darrick Meigs for admission  Treatment plan initiated: Medications  levofloxacin (LEVAQUIN) IVPB 750 mg (750 mg Intravenous New Bag/Given 09/29/13 2058)  albuterol (PROVENTIL) (5 MG/ML) 0.5% nebulizer solution 5 mg (5 mg Nebulization Given 09/29/13 1800)  ipratropium (ATROVENT) nebulizer solution 0.5 mg (0.5 mg Nebulization Given 09/29/13 1800)    9:20 PM Nursing Notes Reviewed/ Care Coordinated Applicable Imaging Reviewed  Interpretation of Laboratory Data incorporated into ED treatment Discussed results and treatment plan with pt. Pt demonstrates understanding and agrees with plan for admission.  Initial diagnostic testing ordered.    Labs Review Labs Reviewed  CBC WITH DIFFERENTIAL - Abnormal; Notable for the following:    WBC 17.3 (*)    Neutro Abs 12.7 (*)    All other components within normal limits  BASIC METABOLIC PANEL - Abnormal; Notable for the following:    Potassium 3.3 (*)    Glucose, Bld 198 (*)    BUN 26 (*)    Creatinine, Ser 1.97 (*)    GFR  calc non Af Amer 26 (*)    GFR calc Af Amer 30 (*)    All other components within normal limits  PRO B NATRIURETIC PEPTIDE - Abnormal; Notable for the following:    Pro B Natriuretic peptide (BNP) 1856.0 (*)    All other components within normal limits  TROPONIN I   Imaging Review Dg Chest 2 View  09/29/2013   CLINICAL DATA:  Shortness of breath  EXAM: CHEST  2 VIEW  COMPARISON:  05/28/2013  FINDINGS: Cardiac shadow is stable. Postsurgical changes are again seen. Increasing density is noted in the right lower and middle lobes consistent with acute pneumonic infiltrate. The left lung is clear.  IMPRESSION: New right mid and lower lung infiltrate.   Electronically Signed   By: Inez Catalina M.D.   On: 09/29/2013 15:35    EKG Interpretation   None       MDM  No diagnosis found. I personally performed the services described in this documentation, which was scribed in my presence. The recorded information has been reviewed and is accurate.  Clinically CHF vs pneumonia. Evidence for both on eval in ED. Pt not requiring O2 however significant Effort to breath when lying flat. Abx in ED.   The patients results and plan were reviewed and discussed.   Any x-rays performed were personally reviewed by myself.   Differential diagnosis were considered with the presenting HPI.  Diagnosis: CAP, CHF new onset  EKG: no acute findings, similar previous  Admission/ observation were discussed with the admitting physician [facility], patient and/or family and they are comfortable with the plan.    Mariea Clonts, MD 09/30/13 [phone]

## 2013-09-29 NOTE — ED Notes (Signed)
Sob, sent from MD office, concerned about PE.

## 2013-09-29 NOTE — Progress Notes (Signed)
ANTIBIOTIC CONSULT NOTE -Initial  Pharmacy Consult for Levaquin Indication: Lung infection  Allergies  Allergen Reactions  . Ace Inhibitors     unknown  . Clopidogrel Bisulfate     REACTION: Sick, Headache, "Felt terrible"  . Codeine Other (See Comments)    REACTION: hallucinations  . Penicillins     unknown    Patient Measurements: Height: 5\' 2"  (157.5 cm) Weight: 216 lb (97.977 kg) IBW/kg (Calculated) : 50.1 Adjusted Body Weight:   Vital Signs: Temp: 98.4 F (36.9 C) (10/27 1730) Temp src: Oral (10/27 1730) BP: 159/65 mmHg (10/27 2058) Pulse Rate: 93 (10/27 2058) Intake/Output from previous day:   Intake/Output from this shift:    Labs:  Recent Labs  09/29/13 1800  WBC 17.3*  HGB 12.2  PLT 320  CREATININE 1.97*   Estimated Creatinine Clearance: 32.8 ml/min (by C-G formula based on Cr of 1.97). No results found for this basename: VANCOTROUGH, VANCOPEAK, VANCORANDOM, GENTTROUGH, GENTPEAK, GENTRANDOM, TOBRATROUGH, TOBRAPEAK, TOBRARND, AMIKACINPEAK, AMIKACINTROU, AMIKACIN,  in the last 72 hours   Microbiology: No results found for this or any previous visit (from the past 720 hour(s)).  Anti-infectives   Start     Dose/Rate Route Frequency Ordered Stop   10/01/13 2100  levofloxacin (LEVAQUIN) IVPB 750 mg     750 mg 100 mL/hr over 90 Minutes Intravenous Every 48 hours 09/29/13 2205     09/29/13 2100  levofloxacin (LEVAQUIN) IVPB 750 mg     750 mg 100 mL/hr over 90 Minutes Intravenous  Once 09/29/13 2048        Assessment: Levaquin 750 mg IV given in ED tonight at 9 PM CrCl 32.8 ml/min  Goal of Therapy:  Eradicate infection  Plan:  Levaquin 750 mg IV every 48 hours, next dose 10/01/13 at 9 PM Monitor renal function Labs per protocol  Abner Greenspan, Hridaan Bouse Bennett 09/29/2013,10:06 PM

## 2013-09-29 NOTE — Progress Notes (Signed)
Patient ID: [name], female   DOB: 1950-12-18, 62 y.o.   MRN: [id number] SUBJECTIVE: CC: Chief Complaint  Patient presents with  . Chest Pain    Symptoms x 4 days. Difficulty breathing when lying flat and ambulating and BLE edema. Denies cough.    HPI: Went to [facility] to [employer] 1 week ago for 1 week. Legs  Swell and then 4 days ago couldn't get air. No coughing. Just short winded. Especially when she lies down. Has not had a feevr. Her girlfriend drove. She was the passenger. No chest pain. Past Medical History  Diagnosis Date  . Esophageal stricture   . Hyperlipidemia   . Hypertension   . Bronchitis   . CAD (coronary artery disease)     s/p Stenting in 2005;  [place] 7/09: Vigorous LV function, 2-3+ MR, RI 40%, RI stent patent, proximal-mid RCA 40-50%;  [place] 7/09:  EF 55-65%, trivial MR;  Myoview 11/18/12: EF 66%, normal LV wall motion, mild to moderate anterior ischemia - reviewed by [facility] and felt to rep breast atten (low risk);  Echo 6/14: mild LVH, EF 55-65%, Gr 2 DD, PASP 44, trivial eff   . Arthritis   . Chronic kidney disease     CREATININE IS UP--GOING TO KIDNEY MD   . Non-insulin dependent diabetes mellitus   . Diverticulosis   . Diabetic neuropathy   . Vitamin D deficiency   . Degenerative joint disease     left shoulder  . Chronic neck pain   . Shortness of breath     at night  . Headache(784.0)   . Anemia     hx of  . Stroke     "mini- years ago"   Past Surgical History  Procedure Laterality Date  . Cardiac catheterization  2003, 2005, 2009  . Bladder surgery      Tack  . Partial hysterectomy    . Shoulder surgery      Right  . Cervical spine surgery  2012    8 screws  . Abdominal hysterectomy    . Laminectomy  12/29/2011  . Cardiac stents    . Lumbar laminectomy/decompression microdiscectomy  12/29/2011    Procedure: LUMBAR LAMINECTOMY/DECOMPRESSION MICRODISCECTOMY;  Surgeon: Floyce Stakes, MD;  Location: [place] [facility];  Service: Neurosurgery;   Laterality: N/A;  Lumbar Two through Lumbar Five Laminectomies/Cellsaver  . Back surgery      2007 & 2013  . Cystoscopy with injection N/A 06/03/2013    Procedure: MACROPLASTIQUE  INJECTION ;  Surgeon: Malka So, MD;  Location: [facility];  Service: Urology;  Laterality: N/A;   History   Social History  . Marital Status: Widowed    Spouse Name: N/A    Number of Children: N/A  . Years of Education: N/A   Occupational History  . Not on file.   Social History Main Topics  . Smoking status: Former Smoker -- 1.00 packs/day for 30 years    Quit date: 12/04/2001  . Smokeless tobacco: Never Used  . Alcohol Use: Yes     Comment: occasional  . Drug Use: No  . Sexual Activity: No   Other Topics Concern  . Not on file   Social History Narrative   Lives in [address]   Family History  Problem Relation Age of Onset  . Heart disease Other    Current Outpatient Prescriptions on File Prior to Visit  Medication Sig Dispense Refill  . aspirin 81 MG EC tablet Take 81 mg  by mouth daily.        [name] atenolol (TENORMIN) 50 MG tablet Take 50 mg by mouth 2 (two) times daily.      [name] gabapentin (NEURONTIN) 300 MG capsule Take 300 mg by mouth 3 (three) times daily.      . insulin detemir (LEVEMIR) 100 UNIT/ML injection Inject 45 Units into the skin daily.       . insulin lispro (HUMALOG) 100 UNIT/ML injection Inject 5 Units into the skin 2 (two) times daily before a meal.  10 mL  12  . linagliptin (TRADJENTA) 5 MG TABS tablet Take 5 mg by mouth daily.      [name] NIFEdipine (PROCARDIA XL/ADALAT-CC) 90 MG 24 hr tablet Take 90 mg by mouth every evening.       . potassium chloride (K-DUR,KLOR-CON) 10 MEQ tablet Take 20 mEq by mouth daily.      . rosuvastatin (CRESTOR) 20 MG tablet Take 1 tablet (20 mg total) by mouth every evening.  30 tablet  11  . valsartan-hydrochlorothiazide (DIOVAN-HCT) 320-25 MG per tablet Take 1 tablet by mouth every morning.  30 tablet  11   No current facility-administered  medications on file prior to visit.   Allergies  Allergen Reactions  . Ace Inhibitors     unknown  . Clopidogrel Bisulfate     REACTION: Sick, Headache, "Felt terrible"  . Codeine     REACTION: hallucinations  . Penicillins     unknown   Immunization History  Administered Date(s) Administered  . DTaP 07/19/2010  . Influenza Whole 09/03/2006  . Influenza,inj,Quad PF,36+ Mos 09/11/2013   Prior to Admission medications   Medication Sig Start Date End Date Taking? Authorizing Provider  aspirin 81 MG EC tablet Take 81 mg by mouth daily.     Yes Historical Provider, MD  atenolol (TENORMIN) 50 MG tablet Take 50 mg by mouth 2 (two) times daily.   Yes Historical Provider, MD  gabapentin (NEURONTIN) 300 MG capsule Take 300 mg by mouth 3 (three) times daily.   Yes Historical Provider, MD  insulin detemir (LEVEMIR) 100 UNIT/ML injection Inject 45 Units into the skin daily.    Yes Historical Provider, MD  insulin lispro (HUMALOG) 100 UNIT/ML injection Inject 5 Units into the skin 2 (two) times daily before a meal. 05/27/13  Yes Memory Argue, RPH  linagliptin (TRADJENTA) 5 MG TABS tablet Take 5 mg by mouth daily.   Yes Historical Provider, MD  NIFEdipine (PROCARDIA XL/ADALAT-CC) 90 MG 24 hr tablet Take 90 mg by mouth every evening.    Yes Historical Provider, MD  potassium chloride (K-DUR,KLOR-CON) 10 MEQ tablet Take 20 mEq by mouth daily.   Yes Historical Provider, MD  rosuvastatin (CRESTOR) 20 MG tablet Take 1 tablet (20 mg total) by mouth every evening. 07/30/13  Yes Vernie Shanks, MD  valsartan-hydrochlorothiazide (DIOVAN-HCT) 320-25 MG per tablet Take 1 tablet by mouth every morning. 07/30/13  Yes Vernie Shanks, MD     ROS: As above in the HPI. All other systems are stable or negative.  OBJECTIVE: APPEARANCE:  Patient in no acute distress.The patient appeared well nourished and normally developed. Acyanotic. Waist: VITAL SIGNS:BP 147/83  Pulse 83  Temp(Src) 98.5 F (36.9 C)  (Oral)  Ht 5\' 2"  (1.575 m)  Wt 219 lb (99.338 kg)  BMI 40.05 kg/m2  SpO2 94%  AAF SKIN: warm and  Dry without overt rashes, tattoos and scars  HEAD and Neck: without JVD, Head and scalp: normal Eyes:No scleral icterus.  Fundi normal, eye movements normal. Ears: Auricle normal, canal normal, Tympanic membranes normal, insufflation normal. Nose: normal Throat: normal Neck & thyroid: normal  CHEST & LUNGS: Chest wall: normal Lungs: Coarse breath sounds. No rales. scatterred bilateral wheezes.  CVS: Reveals the PMI to be normally located. Regular rhythm, First and Second Heart sounds are normal,  absence of murmurs, rubs or gallops. Peripheral vasculature: Radial pulses: normal Dorsal pedis pulses: normal Posterior pulses: normal  ABDOMEN:  Appearance: obese Benign, no organomegaly, no masses, no Abdominal Aortic enlargement. No Guarding , no rebound. No Bruits. Bowel sounds: normal  RECTAL: N/A GU: N/A  EXTREMETIES: trace edematous.homan's negative   NEUROLOGIC: oriented to time,place and person; nonfocal. Strength is normal Sensory is normal Reflexes are normal Cranial Nerves are normal.  ASSESSMENT: Dyspnea - Plan: EKG 12-Lead, DG Chest 2 View  CAD  CKD (chronic kidney disease)  Diabetic neuropathy  DM  HYPERLIPIDEMIA  HYPERTENSION, BENIGN  Vitamin D deficiency  Abnormal chest x-ray  Due to the possibility that this could be a Pulmonary embolism vs. An atypical pneumonia vs heart failure will refer to the ED for further evaluation due to the limited ability to evaluate in the outpatient setting quickly this afternoon.  PLAN:  Orders Placed This Encounter  Procedures  . DG Chest 2 View    Standing Status: Future     Number of Occurrences: 1     Standing Expiration Date: 11/29/2014    Order Specific Question:  Reason for Exam (SYMPTOM  OR DIAGNOSIS REQUIRED)    Answer:  dyspnea    Order Specific Question:  Preferred imaging location?     Answer:  Internal  . EKG 12-Lead   [facility] reading (PRIMARY) by  Dr. Jacelyn Grip: multiple lesions Right lung. Cardiomegaly.                                EKG: no change from previous.   Charge nurse informed at [facility] ED.  No orders of the defined types were placed in this encounter.   There are no discontinued medications. No Follow-up on file.  Shell Blanchette P. Jacelyn Grip, M.D.

## 2013-09-30 ENCOUNTER — Inpatient Hospital Stay (HOSPITAL_COMMUNITY): Payer: Medicare Other

## 2013-09-30 ENCOUNTER — Encounter (HOSPITAL_COMMUNITY): Payer: Self-pay

## 2013-09-30 DIAGNOSIS — E876 Hypokalemia: Secondary | ICD-10-CM

## 2013-09-30 DIAGNOSIS — I10 Essential (primary) hypertension: Secondary | ICD-10-CM

## 2013-09-30 LAB — COMPREHENSIVE METABOLIC PANEL
ALT: 27 U/L (ref 0–35)
AST: 18 U/L (ref 0–37)
Albumin: 2.7 g/dL — ABNORMAL LOW (ref 3.5–5.2)
Alkaline Phosphatase: 85 U/L (ref 39–117)
BUN: 22 mg/dL (ref 6–23)
CO2: 31 mEq/L (ref 19–32)
Calcium: 9.1 mg/dL (ref 8.4–10.5)
Chloride: 100 mEq/L (ref 96–112)
Creatinine, Ser: 1.93 mg/dL — ABNORMAL HIGH (ref 0.50–1.10)
GFR calc Af Amer: 31 mL/min — ABNORMAL LOW (ref >90)
GFR calc non Af Amer: 27 mL/min — ABNORMAL LOW (ref >90)
Glucose, Bld: 153 mg/dL — ABNORMAL HIGH (ref 70–99)
Potassium: 2.8 mEq/L — ABNORMAL LOW (ref 3.5–5.1)
Sodium: 142 mEq/L (ref 135–145)
Total Bilirubin: 0.3 mg/dL (ref 0.3–1.2)
Total Protein: 6.5 g/dL (ref 6.0–8.3)

## 2013-09-30 LAB — TROPONIN I
Troponin I: <0.3 ng/mL (ref <0.30)
Troponin I: <0.3 ng/mL (ref <0.30)

## 2013-09-30 LAB — GLUCOSE, CAPILLARY
Glucose-Capillary: 100 mg/dL — ABNORMAL HIGH (ref 70–99)
Glucose-Capillary: 225 mg/dL — ABNORMAL HIGH (ref 70–99)
Glucose-Capillary: 208 mg/dL — ABNORMAL HIGH (ref 70–99)
Glucose-Capillary: 191 mg/dL — ABNORMAL HIGH (ref 70–99)

## 2013-09-30 LAB — CBC
HCT: 33.2 % — ABNORMAL LOW (ref 36.0–46.0)
Hemoglobin: 10.7 g/dL — ABNORMAL LOW (ref 12.0–15.0)
MCH: 26.8 pg (ref 26.0–34.0)
MCHC: 32.2 g/dL (ref 30.0–36.0)
MCV: 83.2 fL (ref 78.0–100.0)
Platelets: 302 10*3/uL (ref 150–400)
RBC: 3.99 MIL/uL (ref 3.87–5.11)
RDW: 15.4 % (ref 11.5–15.5)
WBC: 13.1 10*3/uL — ABNORMAL HIGH (ref 4.0–10.5)

## 2013-09-30 LAB — MAGNESIUM: Magnesium: 2.2 mg/dL (ref 1.5–2.5)

## 2013-09-30 MED ORDER — POLYETHYLENE GLYCOL 3350 17 G PO PACK
17.0000 g | PACK | Freq: Two times a day (BID) | ORAL | Status: DC
  Administered 2013-09-30 – 2013-10-02 (×4): 17 g via ORAL
  Filled 2013-09-30 (×5): qty 1

## 2013-09-30 MED ORDER — TECHNETIUM TC 99M DIETHYLENETRIAME-PENTAACETIC ACID
40.0000 | Freq: Once | INTRAVENOUS | Status: AC | PRN
  Administered 2013-09-30: 40 via RESPIRATORY_TRACT

## 2013-09-30 MED ORDER — TECHNETIUM TO 99M ALBUMIN AGGREGATED
6.0000 | Freq: Once | INTRAVENOUS | Status: AC | PRN
  Administered 2013-09-30: 6 via INTRAVENOUS

## 2013-09-30 MED ORDER — ZOLPIDEM TARTRATE 5 MG PO TABS
5.0000 mg | ORAL_TABLET | Freq: Every evening | ORAL | Status: DC | PRN
  Administered 2013-09-30 – 2013-10-01 (×3): 5 mg via ORAL
  Filled 2013-09-30 (×3): qty 1

## 2013-09-30 MED ORDER — POTASSIUM CHLORIDE CRYS ER 20 MEQ PO TBCR
40.0000 meq | EXTENDED_RELEASE_TABLET | ORAL | Status: AC
  Administered 2013-09-30 (×2): 40 meq via ORAL
  Filled 2013-09-30 (×2): qty 2

## 2013-09-30 MED ORDER — ALBUTEROL SULFATE (5 MG/ML) 0.5% IN NEBU
2.5000 mg | INHALATION_SOLUTION | RESPIRATORY_TRACT | Status: DC | PRN
  Administered 2013-09-30: 2.5 mg via RESPIRATORY_TRACT
  Filled 2013-09-30: qty 0.5

## 2013-09-30 MED ORDER — IPRATROPIUM BROMIDE 0.02 % IN SOLN
0.5000 mg | RESPIRATORY_TRACT | Status: DC | PRN
  Administered 2013-09-30: 0.5 mg via RESPIRATORY_TRACT
  Filled 2013-09-30: qty 2.5

## 2013-09-30 MED ORDER — FUROSEMIDE 10 MG/ML IJ SOLN
20.0000 mg | Freq: Two times a day (BID) | INTRAMUSCULAR | Status: DC
  Administered 2013-09-30 – 2013-10-02 (×6): 20 mg via INTRAVENOUS
  Filled 2013-09-30 (×6): qty 2

## 2013-09-30 NOTE — Progress Notes (Signed)
[facility] PROGRESS NOTE  [name] [id number] DOB: 1951-04-23 DOA: 09/29/2013 PCP: Anthoney Harada, MD  Assessment/Plan: #1 dyspnea Likely multifactorial secondary to community-acquired pneumonia and probable acute on chronic diastolic CHF exacerbation. Clinical improvement. Cardiac enzymes negative x3. Leukocytosis trending down. Continue IV Lasix, IV Levaquin, oxygen. D-dimer was elevated and a such will check a VQ scan to rule out PE.  #2 community-acquired pneumonia Afebrile. Leukocytosis trending down. Clinical improvement. Continue IV Levaquin.  #3 acute on chronic diastolic CHF exacerbation Questionable etiology. Patient stated on her trip may have missed one or 2 doses of her Lasix at night. Cardiac enzymes negative x3. Clinical improvement. I/O. equal -1.6 L. Continue strict is and os. Daily weights. Continue IV Lasix, aspirin, atenolol, Lipitor.  #4 diabetes mellitus Hemoglobin A1c was 7.9 in April of 2014. Check another hemoglobin A1c. CBGs have ranged from 100-174. Continue Levaquin and sliding scale insulin.  #5 hypokalemia Secondary to diuresis. Check a magnesium level. Replete.  #6 hyperlipidemia Check a fasting lipid panel. Continue Lipitor.  #7 coronary artery disease Stable. Continue aspirin, atenolol, Lipitor, Lasix.  #8 chronic kidney disease Stable.  #9 hypertension Continue atenolol and nifedipine.  #10 prophylaxis Lovenox for DVT prophylaxis.   Code Status: Full Family Communication: Updated patient no family at bedside. Disposition Plan: Home when medically stable.   Consultants:  None  Procedures:  Chest x-ray 09/29/2013  VQ scan pending   Antibiotics:  IV Levaquin 09/29/2013  HPI/Subjective: Patient states shortness of breath is improving since admission. Patient sitting up at bedside eating breakfast.  Objective: Filed Vitals:   09/30/13 0628  BP: 123/76  Pulse: 77  Temp: 98.5 F (36.9 C)  Resp:      Intake/Output Summary (Last 24 hours) at 09/30/13 0851 Last data filed at 09/30/13 [id number]  Gross per 24 hour  Intake      0 ml  Output   1600 ml  Net  -1600 ml   Filed Weights   09/29/13 1730 09/29/13 2229 09/30/13 0628  Weight: 97.977 kg (216 lb) 102.2 kg (225 lb 5 oz) 97.5 kg (214 lb 15.2 oz)    Exam:   General:  NAD  Cardiovascular: RRR  Respiratory: Bibasilar crackles  Abdomen: Soft, nontender, nondistended, positive bowel sounds.  Musculoskeletal: No clubbing or cyanosis. Trace bilateral lower extremity edema.  Data Reviewed: Basic Metabolic Panel:  Recent Labs Lab 09/29/13 1800 09/30/13 0358  NA 139 142  K 3.3* 2.8*  CL 97 100  CO2 31 31  GLUCOSE 198* 153*  BUN 26* 22  CREATININE 1.97* 1.93*  CALCIUM 10.0 9.1  MG  --  2.2   Liver Function Tests:  Recent Labs Lab 09/30/13 0358  AST 18  ALT 27  ALKPHOS 85  BILITOT 0.3  PROT 6.5  ALBUMIN 2.7*   No results found for this basename: LIPASE, AMYLASE,  in the last 168 hours No results found for this basename: AMMONIA,  in the last 168 hours CBC:  Recent Labs Lab 09/29/13 1800 09/30/13 0358  WBC 17.3* 13.1*  NEUTROABS 12.7*  --   HGB 12.2 10.7*  HCT 36.7 33.2*  MCV 83.0 83.2  PLT 320 302   Cardiac Enzymes:  Recent Labs Lab 09/29/13 1800 09/29/13 2249 09/30/13 0358  TROPONINI <0.30 <0.30 <0.30   BNP (last 3 results)  Recent Labs  05/09/13 1148 09/29/13 1800  PROBNP 69.0 1856.0*   CBG:  Recent Labs Lab 09/29/13 2235 09/30/13 0806  GLUCAP 174* 100*    No results found  for this or any previous visit (from the past 240 hour(s)).   Studies: Dg Chest 2 View  09/29/2013   CLINICAL DATA:  Shortness of breath  EXAM: CHEST  2 VIEW  COMPARISON:  05/28/2013  FINDINGS: Cardiac shadow is stable. Postsurgical changes are again seen. Increasing density is noted in the right lower and middle lobes consistent with acute pneumonic infiltrate. The left lung is clear.  IMPRESSION: New  right mid and lower lung infiltrate.   Electronically Signed   By: Inez Catalina M.D.   On: 09/29/2013 15:35    Scheduled Meds: . aspirin EC  81 mg Oral Daily  . atenolol  50 mg Oral BID  . atorvastatin  40 mg Oral q1800  . enoxaparin (LOVENOX) injection  40 mg Subcutaneous Q24H  . furosemide  20 mg Intravenous BID  . gabapentin  300 mg Oral TID  . insulin aspart  0-15 Units Subcutaneous TID WC  . insulin detemir  60 Units Subcutaneous QHS  . [START ON 10/01/2013] levofloxacin (LEVAQUIN) IV  750 mg Intravenous Q48H  . NIFEdipine  90 mg Oral QPM  . potassium chloride  20 mEq Oral Daily  . potassium chloride  40 mEq Oral Q4H  . sodium chloride  3 mL Intravenous Q12H   Continuous Infusions:   Principal Problem:   Dyspnea Active Problems:   Acute diastolic CHF (congestive heart failure)   Community acquired pneumonia   DM   HYPERLIPIDEMIA   HYPERTENSION, BENIGN   CAD   CKD (chronic kidney disease)   Hypokalemia    Time spent: > 30 mins    [facility] Pager [phone]. If 7PM-7AM, please contact night-coverage at [email or web address], password [facility] 09/30/2013, 8:51 AM  LOS: 1 day

## 2013-09-30 NOTE — Progress Notes (Signed)
Utilization Review Complete  

## 2013-10-01 DIAGNOSIS — M542 Cervicalgia: Secondary | ICD-10-CM

## 2013-10-01 DIAGNOSIS — J40 Bronchitis, not specified as acute or chronic: Secondary | ICD-10-CM

## 2013-10-01 DIAGNOSIS — K5900 Constipation, unspecified: Secondary | ICD-10-CM

## 2013-10-01 DIAGNOSIS — I2510 Atherosclerotic heart disease of native coronary artery without angina pectoris: Secondary | ICD-10-CM

## 2013-10-01 LAB — GLUCOSE, CAPILLARY
Glucose-Capillary: 118 mg/dL — ABNORMAL HIGH (ref 70–99)
Glucose-Capillary: 276 mg/dL — ABNORMAL HIGH (ref 70–99)
Glucose-Capillary: 345 mg/dL — ABNORMAL HIGH (ref 70–99)
Glucose-Capillary: 383 mg/dL — ABNORMAL HIGH (ref 70–99)

## 2013-10-01 LAB — CBC WITH DIFFERENTIAL/PLATELET
Basophils Absolute: 0 10*3/uL (ref 0.0–0.1)
Basophils Relative: 0 % (ref 0–1)
Eosinophils Absolute: 0.4 10*3/uL (ref 0.0–0.7)
Eosinophils Relative: 4 % (ref 0–5)
HCT: 34 % — ABNORMAL LOW (ref 36.0–46.0)
Hemoglobin: 11 g/dL — ABNORMAL LOW (ref 12.0–15.0)
Lymphocytes Relative: 20 % (ref 12–46)
Lymphs Abs: 2.1 10*3/uL (ref 0.7–4.0)
MCH: 27 pg (ref 26.0–34.0)
MCHC: 32.4 g/dL (ref 30.0–36.0)
MCV: 83.5 fL (ref 78.0–100.0)
Monocytes Absolute: 1 10*3/uL (ref 0.1–1.0)
Monocytes Relative: 9 % (ref 3–12)
Neutro Abs: 7.2 10*3/uL (ref 1.7–7.7)
Neutrophils Relative %: 67 % (ref 43–77)
Platelets: 287 10*3/uL (ref 150–400)
RBC: 4.07 MIL/uL (ref 3.87–5.11)
RDW: 15.2 % (ref 11.5–15.5)
WBC: 10.6 10*3/uL — ABNORMAL HIGH (ref 4.0–10.5)

## 2013-10-01 MED ORDER — INSULIN DETEMIR 100 UNIT/ML ~~LOC~~ SOLN
65.0000 [IU] | Freq: Every day | SUBCUTANEOUS | Status: DC
  Administered 2013-10-01: 65 [IU] via SUBCUTANEOUS
  Filled 2013-10-01 (×2): qty 0.65

## 2013-10-01 MED ORDER — PREDNISONE 20 MG PO TABS
60.0000 mg | ORAL_TABLET | Freq: Every day | ORAL | Status: DC
  Administered 2013-10-01 – 2013-10-02 (×2): 60 mg via ORAL
  Filled 2013-10-01 (×2): qty 3

## 2013-10-01 MED ORDER — INSULIN ASPART 100 UNIT/ML ~~LOC~~ SOLN
0.0000 [IU] | Freq: Three times a day (TID) | SUBCUTANEOUS | Status: DC
  Administered 2013-10-01: 15 [IU] via SUBCUTANEOUS
  Administered 2013-10-02: 11 [IU] via SUBCUTANEOUS
  Administered 2013-10-02: 4 [IU] via SUBCUTANEOUS

## 2013-10-01 MED ORDER — INSULIN ASPART 100 UNIT/ML ~~LOC~~ SOLN
0.0000 [IU] | Freq: Every day | SUBCUTANEOUS | Status: DC
  Administered 2013-10-01: 5 [IU] via SUBCUTANEOUS

## 2013-10-01 MED ORDER — SORBITOL 70 % SOLN: 30.0000 mL | Freq: Every day | Status: DC | PRN

## 2013-10-01 MED ORDER — LEVOFLOXACIN 750 MG PO TABS: 750.0000 mg | ORAL_TABLET | Freq: Every day | ORAL | Status: DC

## 2013-10-01 MED ORDER — LEVOFLOXACIN 750 MG PO TABS
750.0000 mg | ORAL_TABLET | ORAL | Status: DC
  Administered 2013-10-01: 750 mg via ORAL
  Filled 2013-10-01: qty 1

## 2013-10-01 NOTE — Progress Notes (Signed)
[facility] PROGRESS NOTE  [name] [id number] DOB: 1951-05-11 DOA: 09/29/2013 PCP: Anthoney Harada, MD  Assessment/Plan: #1 dyspnea  - Likely multifactorial secondary to community - acquired pneumonia and probable acute on chronic diastolic CHF exacerbation. - Cardiac enzymes negative x3.  - Leukocytosis trending down.  - Thus far, continued on IV Lasix, IV Levaquin, oxygen - May consider transitioning to PO abx soon - D-dimer was elevated with follow up neg VQ #2 community-acquired pneumonia  - Afebrile.  - Leukocytosis trending down.  - Clinical improvement.  - Continue IV Levaquin.  #3 acute on chronic diastolic CHF exacerbation  - Cardiac enzymes negative x3.  - Clinical improvement.  - Continue IV Lasix, aspirin, atenolol, Lipitor.  #4 diabetes mellitus  - Hemoglobin A1c was 7.9 in April of 2014.  - Check another hemoglobin A1c. CBGs have ranged from 100-174.  - Continue Levaquin and sliding scale insulin.  #5 hypokalemia  - Secondary to diuresis - Replaced - Will follow lytes and replace as needed  #6 hyperlipidemia  - Continue Lipitor.  #7 coronary artery disease  - Stable. Continue aspirin, atenolol, Lipitor, Lasix.  #8 chronic kidney disease  - Stable.  #9 hypertension  - Continue atenolol and nifedipine.  #10 prophylaxis  - Lovenox for DVT prophylaxis.  #11 constipation - No BM in over 4 days - No relief w/ miralax and prune juice - Will give sorbitol PRN   Code Status: Full Family Communication: Pt in room (indicate person spoken with, relationship, and if by phone, the number) Disposition Plan: Pending  Procedures: VQ -09/30/13  Antibiotics: IV Levaquin - 09/29/2013>>>  HPI/Subjective: Reports feeling constipated. Cont w/ mild SOB and complains of wheezing, previously improved w/ breathing tx.  Objective: Filed Vitals:   09/30/13 1902 09/30/13 2155 10/01/13 0500 10/01/13 0549  BP:  152/72  120/59  Pulse:  86  77  Temp:   99 F (37.2 C)  98.6 F (37 C)  TempSrc:      Resp:  20  20  Height:      Weight:   101.8 kg (224 lb 6.9 oz)   SpO2: 97% 97%  100%    Intake/Output Summary (Last 24 hours) at 10/01/13 0842 Last data filed at 09/30/13 2223  Gross per 24 hour  Intake    680 ml  Output    700 ml  Net    -20 ml   Filed Weights   09/29/13 2229 09/30/13 0628 10/01/13 0500  Weight: 102.2 kg (225 lb 5 oz) 97.5 kg (214 lb 15.2 oz) 101.8 kg (224 lb 6.9 oz)    Exam:   General:  Awake, in nad  Cardiovascular: regular, s1, s2  Respiratory: normal resp effort, no wheezing  Abdomen: soft, obese, nontender  Musculoskeletal: perfused, no clubbing or cyanosis   Data Reviewed: Basic Metabolic Panel:  Recent Labs Lab 09/29/13 1800 09/30/13 0358  NA 139 142  K 3.3* 2.8*  CL 97 100  CO2 31 31  GLUCOSE 198* 153*  BUN 26* 22  CREATININE 1.97* 1.93*  CALCIUM 10.0 9.1  MG  --  2.2   Liver Function Tests:  Recent Labs Lab 09/30/13 0358  AST 18  ALT 27  ALKPHOS 85  BILITOT 0.3  PROT 6.5  ALBUMIN 2.7*   No results found for this basename: LIPASE, AMYLASE,  in the last 168 hours No results found for this basename: AMMONIA,  in the last 168 hours CBC:  Recent Labs Lab 09/29/13 1800 09/30/13 0358  WBC 17.3* 13.1*  NEUTROABS 12.7*  --   HGB 12.2 10.7*  HCT 36.7 33.2*  MCV 83.0 83.2  PLT 320 302   Cardiac Enzymes:  Recent Labs Lab 09/29/13 1800 09/29/13 2249 09/30/13 0358 09/30/13 1014  TROPONINI <0.30 <0.30 <0.30 <0.30   BNP (last 3 results)  Recent Labs  05/09/13 1148 09/29/13 1800  PROBNP 69.0 1856.0*   CBG:  Recent Labs Lab 09/30/13 0806 09/30/13 1125 09/30/13 1650 09/30/13 2053 10/01/13 0715  GLUCAP 100* 225* 208* 191* 118*    No results found for this or any previous visit (from the past 240 hour(s)).   Studies: Dg Chest 2 View  09/29/2013   CLINICAL DATA:  Shortness of breath  EXAM: CHEST  2 VIEW  COMPARISON:  05/28/2013  FINDINGS: Cardiac shadow  is stable. Postsurgical changes are again seen. Increasing density is noted in the right lower and middle lobes consistent with acute pneumonic infiltrate. The left lung is clear.  IMPRESSION: New right mid and lower lung infiltrate.   Electronically Signed   By: Inez Catalina M.D.   On: 09/29/2013 15:35   Nm Pulmonary Perf And Vent  09/30/2013   CLINICAL DATA:  Short of breath, concern for pulmonary embolism  EXAM: NUCLEAR MEDICINE VENTILATION - PERFUSION LUNG SCAN  TECHNIQUE: Ventilation images were obtained in multiple projections using inhaled aerosol technetium 99 M DTPA. Perfusion images were obtained in multiple projections after intravenous injection of Tc-22m MAA.  COMPARISON:  Chest radiograph 09/29/2013  RADIOPHARMACEUTICALS:  40.0 mCi Tc-93m DTPA aerosol and 6.0 mCi Tc-41m MAA  FINDINGS: Ventilation: No focal ventilation defect.  Perfusion: No wedge shaped peripheral perfusion defects to suggest acute pulmonary embolism.  IMPRESSION: No evidence of acute pulmonary embolism. Normal perfusion scan.   Electronically Signed   By: Suzy Bouchard M.D.   On: 09/30/2013 11:24    Scheduled Meds: . aspirin EC  81 mg Oral Daily  . atenolol  50 mg Oral BID  . atorvastatin  40 mg Oral q1800  . enoxaparin (LOVENOX) injection  40 mg Subcutaneous Q24H  . furosemide  20 mg Intravenous BID  . gabapentin  300 mg Oral TID  . insulin aspart  0-15 Units Subcutaneous TID WC  . insulin detemir  60 Units Subcutaneous QHS  . levofloxacin (LEVAQUIN) IV  750 mg Intravenous Q48H  . NIFEdipine  90 mg Oral QPM  . polyethylene glycol  17 g Oral BID  . potassium chloride  20 mEq Oral Daily  . sodium chloride  3 mL Intravenous Q12H   Continuous Infusions:   Principal Problem:   Dyspnea Active Problems:   DM   HYPERLIPIDEMIA   HYPERTENSION, BENIGN   CAD   CKD (chronic kidney disease)   Acute diastolic CHF (congestive heart failure)   Community acquired pneumonia   Hypokalemia  Time spent:  77min  Eva Vallee, [facility] Pager [phone]. If 7PM-7AM, please contact night-coverage at [email or web address], password [facility] 10/01/2013, 8:42 AM  LOS: 2 days

## 2013-10-01 NOTE — Progress Notes (Signed)
Inpatient Diabetes Program Recommendations  AACE/ADA: New Consensus Statement on Inpatient Glycemic Control (2013)  Target Ranges:  Prepandial:   less than 140 mg/dL      Peak postprandial:   less than 180 mg/dL (1-2 hours)      Critically ill patients:  140 - 180 mg/dL   Results for [name], [name] (MRN [id number]) as of 10/01/2013 12:26  Ref. Range 09/30/2013 08:06 09/30/2013 11:25 09/30/2013 16:50 09/30/2013 20:53 10/01/2013 07:15 10/01/2013 11:57  Glucose-Capillary Latest Range: 70-99 mg/dL 100 (H) 225 (H) 208 (H) 191 (H) 118 (H) 276 (H)   Inpatient Diabetes Program Recommendations Correction (SSI): Please consider increasing Novolog correction to resistant scale and adding Novolog bedtime correction. Insulin - Meal Coverage: Please consider ordering Novolog 5 units TID with meals for meal coverage.  Note: Patient has a history of diabetes and takes Levemir 60 units QHS, Humalog 10-13 units TID with meals, and Tradjenta 5 mg daily as an outpatient for diabetes management.  Currently, patient is ordered to receive Levemir 60 units QHS, Novolog 0-15 units AC for inpatient glycemic control.  Noted in the chart that Dr. Jacelyn Grip ([place]; patient's PCP) noted on 10/27 that patient was prescribed Levemir 45 units daily, Humalog 5 units BID with meals, and Trandjenta 5 mg daily.  Called patient to discuss discrepancies in home medications.  Patient reports that Dr. Jacelyn Grip referred her to Dr. Dorris Fetch and she started working with Dr. Dorris Fetch in August and he has been adjusting her insulin to improve glycemic control.  Patient verified that she last saw Dr. Dorris Fetch about 2 weeks ago and that she is taking Levemir 60 units QHS, Humalog 10-13 units TID with meals, and Tradjenta 5 mg daily.   Fasting blood glucose over the past 2 mornings has been less than 120 mg/dl.  However, post prandial glucose consistently elevated.  Noted patient was started on steroids today which is likely to further  increase blood glucose.  Please consider increasing Novolog correction to resistant scale (while on steroids), adding Novolog bedtime correction scale, and ordering Novolog 5 units TID with meals for meal coverage.  Will continue to follow.  Thanks, Barnie Alderman, RN, MSN, CCRN Diabetes Coordinator Inpatient Diabetes Program [phone] (Team Pager) [phone] ([place] office) [phone] [facility] office)

## 2013-10-01 NOTE — Care Management Note (Signed)
    Page 1 of 1   10/01/2013     3:35:31 PM   CARE MANAGEMENT NOTE 10/01/2013  Patient:  [name], [name]   Account Number:  [id number]  Date Initiated:  10/01/2013  Documentation initiated by:  Claretha Cooper  Subjective/Objective Assessment:   Pt admitted from home where she lives alone. Her mom lives nearby. No anticipated HH/DME needs     Action/Plan:   Anticipated DC Date:  10/01/2013   Anticipated DC Plan:  [place]  CM consult      Choice offered to / List presented to:             Status of service:  Completed, signed off Medicare Important Message given?   (If response is "NO", the following Medicare IM given date fields will be blank) Date Medicare IM given:   Date Additional Medicare IM given:    Discharge Disposition:    Per UR Regulation:    If discussed at Long Length of Stay Meetings, dates discussed:    Comments:  10/01/13 Claretha Cooper RN BSN CM

## 2013-10-02 ENCOUNTER — Inpatient Hospital Stay (HOSPITAL_COMMUNITY): Payer: Medicare Other

## 2013-10-02 DIAGNOSIS — R918 Other nonspecific abnormal finding of lung field: Secondary | ICD-10-CM

## 2013-10-02 LAB — COMPREHENSIVE METABOLIC PANEL
ALT: 27 U/L (ref 0–35)
AST: 21 U/L (ref 0–37)
Albumin: 2.7 g/dL — ABNORMAL LOW (ref 3.5–5.2)
Alkaline Phosphatase: 85 U/L (ref 39–117)
BUN: 29 mg/dL — ABNORMAL HIGH (ref 6–23)
CO2: 28 mEq/L (ref 19–32)
Calcium: 8.9 mg/dL (ref 8.4–10.5)
Chloride: 102 mEq/L (ref 96–112)
Creatinine, Ser: 1.79 mg/dL — ABNORMAL HIGH (ref 0.50–1.10)
GFR calc Af Amer: 34 mL/min — ABNORMAL LOW (ref >90)
GFR calc non Af Amer: 29 mL/min — ABNORMAL LOW (ref >90)
Glucose, Bld: 238 mg/dL — ABNORMAL HIGH (ref 70–99)
Potassium: 3.9 mEq/L (ref 3.5–5.1)
Sodium: 139 mEq/L (ref 135–145)
Total Bilirubin: 0.2 mg/dL — ABNORMAL LOW (ref 0.3–1.2)
Total Protein: 6.5 g/dL (ref 6.0–8.3)

## 2013-10-02 LAB — GLUCOSE, CAPILLARY
Glucose-Capillary: 197 mg/dL — ABNORMAL HIGH (ref 70–99)
Glucose-Capillary: 264 mg/dL — ABNORMAL HIGH (ref 70–99)

## 2013-10-02 MED ORDER — LEVOFLOXACIN 750 MG PO TABS: 750.0000 mg | ORAL_TABLET | ORAL | Status: DC

## 2013-10-02 MED ORDER — SORBITOL 70 % SOLN: 30.0000 mL | Freq: Every day | Status: DC | PRN

## 2013-10-02 MED ORDER — PREDNISONE 20 MG PO TABS: 60.0000 mg | ORAL_TABLET | Freq: Every day | ORAL | Status: DC

## 2013-10-02 MED ORDER — FUROSEMIDE 40 MG PO TABS: 40.0000 mg | ORAL_TABLET | Freq: Every day | ORAL | Status: DC

## 2013-10-02 NOTE — Progress Notes (Signed)
Inpatient Diabetes Program Recommendations  AACE/ADA: New Consensus Statement on Inpatient Glycemic Control (2013)  Target Ranges:  Prepandial:   less than 140 mg/dL      Peak postprandial:   less than 180 mg/dL (1-2 hours)      Critically ill patients:  140 - 180 mg/dL   Results for BUFFI, WITKIN (MRN [id number]) as of 10/02/2013 09:20  Ref. Range 10/01/2013 07:15 10/01/2013 11:57 10/01/2013 17:08 10/01/2013 21:38 10/02/2013 08:09  Glucose-Capillary Latest Range: 70-99 mg/dL 118 (H) 276 (H) 345 (H) 383 (H) 197 (H)   Inpatient Diabetes Program Recommendations Insulin - Meal Coverage: Please consider ordering Novolog 5 units TID with meals for meal coverage.  Note: Noted Levemir was increased to 65 units QHS and patient received Lantus 65 units last night at bedtime.  Fasting blood glucose this morning was 197 mg/dl.  Noted post-prandial glucose is consistently elevated.  Please consider ordering Novolog 5 units TID with meals for meal coverage.  Will continue to follow.  Thanks, Barnie Alderman, RN, MSN, CCRN Diabetes Coordinator Inpatient Diabetes Program [phone] (Team Pager) [phone] ([place] office) [phone] [facility] office)

## 2013-10-02 NOTE — Progress Notes (Signed)
AVS reviewed with patient.  Verbalized understanding of discharge instructions, medications and physician follow-up.  Patient educated on Heart Failure booklet.  Patient verbalized understanding of booklet education and daily weights and recording the daily weights and contacting physician for weight gain, etc. Per booklet instructions.  Pt's IV removed.  Site WNL.  Pt transported by NT via w/c to main entrance for discharge.  Pt stable at time of discharge.

## 2013-10-02 NOTE — Progress Notes (Signed)
ANTIBIOTIC CONSULT NOTE   Pharmacy Consult for Levaquin Indication: Lung infection  Allergies  Allergen Reactions  . Ace Inhibitors     unknown  . Clopidogrel Bisulfate     REACTION: Sick, Headache, "Felt terrible"  . Codeine Other (See Comments)    REACTION: hallucinations  . Penicillins     unknown   Patient Measurements: Height: 5\' 2"  (157.5 cm) Weight: 213 lb 13.5 oz (97 kg) (No pillows, only bed pad, top sheet and blanket) IBW/kg (Calculated) : 50.1  Vital Signs: Temp: 97.8 F (36.6 C) (10/30 0459) Temp src: Oral (10/30 0459) BP: 141/63 mmHg (10/30 0459) Pulse Rate: 80 (10/30 0459) Intake/Output from previous day: 10/29 0701 - 10/30 0700 In: 243 [P.O.:240; I.V.:3] Out: 1250 [Urine:1250] Intake/Output from this shift:    Labs:  Recent Labs  09/29/13 1800 09/30/13 0358 10/01/13 1022 10/02/13 0537  WBC 17.3* 13.1* 10.6*  --   HGB 12.2 10.7* 11.0*  --   PLT 320 302 287  --   CREATININE 1.97* 1.93*  --  1.79*   Estimated Creatinine Clearance: 35.9 ml/min (by C-G formula based on Cr of 1.79). No results found for this basename: VANCOTROUGH, VANCOPEAK, VANCORANDOM, GENTTROUGH, GENTPEAK, GENTRANDOM, TOBRATROUGH, TOBRAPEAK, TOBRARND, AMIKACINPEAK, AMIKACINTROU, AMIKACIN,  in the last 72 hours   Microbiology: No results found for this or any previous visit (from the past 720 hour(s)).  Anti-infectives   Start     Dose/Rate Route Frequency Ordered Stop   10/01/13 2100  levofloxacin (LEVAQUIN) IVPB 750 mg  Status:  Discontinued     750 mg 100 mL/hr over 90 Minutes Intravenous Every 48 hours 09/29/13 2205 10/01/13 0945   10/01/13 1000  levofloxacin (LEVAQUIN) tablet 750 mg  Status:  Discontinued     750 mg Oral Daily 10/01/13 0945 10/01/13 0950   10/01/13 1000  levofloxacin (LEVAQUIN) tablet 750 mg     750 mg Oral Every 48 hours 10/01/13 0950     09/29/13 2100  levofloxacin (LEVAQUIN) IVPB 750 mg     750 mg 100 mL/hr over 90 Minutes Intravenous  Once 09/29/13  2048 09/29/13 2228     Assessment: 62yo female on Levaquin for suspected pna.  SCr has improved slightly but ClCr < 86ml/min.  Levaquin has been switched to PO.  Estimated Creatinine Clearance: 35.9 ml/min (by C-G formula based on Cr of 1.79).  Goal of Therapy:  Eradicate infection  Plan:  Levaquin 750 mg PO every 48 hours Pharmacy will sign off  [place] Crane, Lakrisha Iseman A 10/02/2013,7:40 AM

## 2013-10-02 NOTE — Progress Notes (Signed)
Follow-up appointment made for Thursday, October 09, 2012 at 2 p.m.

## 2013-10-02 NOTE — Discharge Summary (Addendum)
Physician Discharge Summary  [name] [id number] DOB: 1951-07-20 DOA: 09/29/2013  PCP: Anthoney Harada, MD  Admit date: 09/29/2013 Discharge date: 10/02/2013  Time spent: 35 minutes  Recommendations for Outpatient Follow-up:  1. Follow up with PCP in 1-2 weeks 2. Would follow up renal panel in 1-2 weeks to follow kidney function as patient is continued on diuretics  Discharge Diagnoses:  Principal Problem:   Dyspnea Active Problems:   DM   HYPERLIPIDEMIA   HYPERTENSION, BENIGN   CAD   CKD (chronic kidney disease)   Acute diastolic CHF (congestive heart failure)   Community acquired pneumonia   Hypokalemia   Discharge Condition: Improved  Diet recommendation: Diabetic  Filed Weights   10/01/13 0500 10/02/13 0459 10/02/13 0545  Weight: 101.8 kg (224 lb 6.9 oz) 100.1 kg (220 lb 10.9 oz) 97 kg (213 lb 13.5 oz)    History of present illness:  62 year old female with a history of CAD status post ending in 2005, diabetes mellitus, hypertension, hyperlipidemia who came to the ED with worsening shortness of breath which has been ongoing for the past 4 days. Patient took trip to [place], and started experiencing shortness of breath last Thursday. Patient says that the shortness of breath was worse on lying down and on exertion, she denies cough with phlegm, she did have a low-grade temperature last night. Denies any chills, no nausea vomiting no diarrhea. No dysuria urgency or frequency of urination.  Patient had a 2-D echocardiogram done in June 2014 which showed grade 2 diastolic dysfunction with EF 55-65%. Today her BNP is elevated to 1856, her last BNP in June 2014 was 69.0. Chest x-ray shows possible new infiltrate and she has orally received one dose of Levaquin for the community acquired pneumonia.  Hospital Course:  #1 dyspnea  - Likely multifactorial secondary to community  - acquired pneumonia and probable acute on chronic diastolic CHF exacerbation.  -  Cardiac enzymes negative x3.  - Leukocytosis trending down.  - Thus far, continued on IV Lasix, IV Levaquin, oxygen  - May consider transitioning to PO abx soon  - D-dimer was elevated with follow up neg VQ  #2 community-acquired pneumonia  - Afebrile.  - Leukocytosis trending down.  - Clinical improvement with improvement noted on repeat cxr on 10/30 - Continued on Levaquin.  #3 acute on chronic diastolic CHF exacerbation  - Recent echo documented on 6/14 with EF of 55-65% with grade 2 diastolic dysfunction - Cardiac enzymes negative x3.  - Clinical improvement.  - Continue Lasix, aspirin, atenolol, Lipitor.  #4 diabetes mellitus  - Hemoglobin A1c was 7.9 in April of 2014.  - Check another hemoglobin A1c. CBGs have ranged from 100-174.  - Continued long acting insulin and sliding scale insulin.  #5 hypokalemia  - Secondary to diuresis  - Replaced  #6 hyperlipidemia  - Continue Lipitor.  #7 coronary artery disease  - Stable. Continue aspirin, atenolol, Lipitor, Lasix.  #8 chronic kidney disease  - Stable.  #9 hypertension  - Continue atenolol and nifedipine.  #10 prophylaxis  - Lovenox for DVT prophylaxis.  #11 constipation  - No BM in over 4 days  - No relief w/ miralax and prune juice  - Given sorbitol PRN  Procedures:  VQ 09/30/13 - neg for PE  Discharge Exam: Filed Vitals:   10/01/13 2139 10/02/13 0459 10/02/13 0545 10/02/13 0927  BP: 161/66 141/63  138/70  Pulse: 94 80  82  Temp: 98.6 F (37 C) 97.8 F (36.6 C)  TempSrc: Oral Oral    Resp: 20 20    Height:      Weight:  100.1 kg (220 lb 10.9 oz) 97 kg (213 lb 13.5 oz)   SpO2: 100% 100%  96%    General: Awake, in nad Cardiovascular: regular, s1, s2 Respiratory: normal resp effort, no wheezing  Discharge Instructions       Future Appointments Provider Department Dept Phone   10/31/2013 9:00 AM Vernie Shanks, MD [place] [phone]       Medication List          albuterol 108 (90 BASE) MCG/ACT inhaler  Commonly known as:  PROVENTIL HFA;VENTOLIN HFA  Inhale 2 puffs into the lungs every 6 (six) hours as needed for wheezing.     aspirin 81 MG EC tablet  Take 81 mg by mouth daily.     atenolol 50 MG tablet  Commonly known as:  TENORMIN  Take 50 mg by mouth 2 (two) times daily.     cholecalciferol 1000 UNITS tablet  Commonly known as:  VITAMIN D  Take 1,000 Units by mouth every morning.     gabapentin 300 MG capsule  Commonly known as:  NEURONTIN  Take 300 mg by mouth 3 (three) times daily.     insulin detemir 100 UNIT/ML injection  Commonly known as:  LEVEMIR  Inject 60 Units into the skin at bedtime.     insulin lispro 100 UNIT/ML injection  Commonly known as:  HUMALOG  - Inject into the skin See admin instructions. To be administered as directed per sliding scale:  - 90-150= 10  Units  - 151-200=11 units  - 201-250=12 units  - 251-300= 13 units   - To call MD if levels are between 251-300 for three consecutive days     levofloxacin 750 MG tablet  Commonly known as:  LEVAQUIN  Take 1 tablet (750 mg total) by mouth every other day.     NIFEdipine 90 MG 24 hr tablet  Commonly known as:  PROCARDIA XL/ADALAT-CC  Take 90 mg by mouth every evening.     potassium chloride 10 MEQ tablet  Commonly known as:  K-DUR  Take 20 mEq by mouth daily.     predniSONE 20 MG tablet  Commonly known as:  DELTASONE  Take 3 tablets (60 mg total) by mouth daily with breakfast.     rosuvastatin 20 MG tablet  Commonly known as:  CRESTOR  Take 1 tablet (20 mg total) by mouth every evening.     sorbitol 70 % Soln  Take 30 mLs by mouth daily as needed.     TRADJENTA 5 MG Tabs tablet  Generic drug:  linagliptin  Take 5 mg by mouth daily.     valsartan-hydrochlorothiazide 320-25 MG per tablet  Commonly known as:  DIOVAN-HCT  Take 1 tablet by mouth every morning.       Allergies  Allergen Reactions  . Ace Inhibitors     unknown  .  Clopidogrel Bisulfate     REACTION: Sick, Headache, "Felt terrible"  . Codeine Other (See Comments)    REACTION: hallucinations  . Penicillins     unknown   Follow-up Information   Follow up with Anthoney Harada, MD. Schedule an appointment as soon as possible for a visit in 1 week.   Specialty:  Family Medicine   Contact information:   [place] [address] [phone]        The results of  significant diagnostics from this hospitalization (including imaging, microbiology, ancillary and laboratory) are listed below for reference.    Significant Diagnostic Studies: Dg Chest 2 View  09/29/2013   CLINICAL DATA:  Shortness of breath  EXAM: CHEST  2 VIEW  COMPARISON:  05/28/2013  FINDINGS: Cardiac shadow is stable. Postsurgical changes are again seen. Increasing density is noted in the right lower and middle lobes consistent with acute pneumonic infiltrate. The left lung is clear.  IMPRESSION: New right mid and lower lung infiltrate.   Electronically Signed   By: Inez Catalina M.D.   On: 09/29/2013 15:35   Nm Pulmonary Perf And Vent  09/30/2013   CLINICAL DATA:  Short of breath, concern for pulmonary embolism  EXAM: NUCLEAR MEDICINE VENTILATION - PERFUSION LUNG SCAN  TECHNIQUE: Ventilation images were obtained in multiple projections using inhaled aerosol technetium 99 M DTPA. Perfusion images were obtained in multiple projections after intravenous injection of Tc-15m MAA.  COMPARISON:  Chest radiograph 09/29/2013  RADIOPHARMACEUTICALS:  40.0 mCi Tc-32m DTPA aerosol and 6.0 mCi Tc-51m MAA  FINDINGS: Ventilation: No focal ventilation defect.  Perfusion: No wedge shaped peripheral perfusion defects to suggest acute pulmonary embolism.  IMPRESSION: No evidence of acute pulmonary embolism. Normal perfusion scan.   Electronically Signed   By: Suzy Bouchard M.D.   On: 09/30/2013 11:24   Dg Chest Port 1 View  10/02/2013   CLINICAL DATA:  Followup pneumonia. Shortness  of breath. Congestive heart failure.  EXAM: PORTABLE CHEST - 1 VIEW  COMPARISON:  09/29/2013  FINDINGS: Right lower lung airspace disease shows interval improvement since previous study. Left lung remains clear. No evidence of pleural effusion. Heart size is stable.  IMPRESSION: Improving right lower lobe airspace disease.   Electronically Signed   By: Earle Gell M.D.   On: 10/02/2013 08:05    Microbiology: No results found for this or any previous visit (from the past 240 hour(s)).   Labs: Basic Metabolic Panel:  Recent Labs Lab 09/29/13 1800 09/30/13 0358 10/02/13 0537  NA 139 142 139  K 3.3* 2.8* 3.9  CL 97 100 102  CO2 31 31 28   GLUCOSE 198* 153* 238*  BUN 26* 22 29*  CREATININE 1.97* 1.93* 1.79*  CALCIUM 10.0 9.1 8.9  MG  --  2.2  --    Liver Function Tests:  Recent Labs Lab 09/30/13 0358 10/02/13 0537  AST 18 21  ALT 27 27  ALKPHOS 85 85  BILITOT 0.3 0.2*  PROT 6.5 6.5  ALBUMIN 2.7* 2.7*   No results found for this basename: LIPASE, AMYLASE,  in the last 168 hours No results found for this basename: AMMONIA,  in the last 168 hours CBC:  Recent Labs Lab 09/29/13 1800 09/30/13 0358 10/01/13 1022  WBC 17.3* 13.1* 10.6*  NEUTROABS 12.7*  --  7.2  HGB 12.2 10.7* 11.0*  HCT 36.7 33.2* 34.0*  MCV 83.0 83.2 83.5  PLT 320 302 287   Cardiac Enzymes:  Recent Labs Lab 09/29/13 1800 09/29/13 2249 09/30/13 0358 09/30/13 1014  TROPONINI <0.30 <0.30 <0.30 <0.30   BNP: BNP (last 3 results)  Recent Labs  05/09/13 1148 09/29/13 1800  PROBNP 69.0 1856.0*   CBG:  Recent Labs Lab 10/01/13 0715 10/01/13 1157 10/01/13 1708 10/01/13 2138 10/02/13 0809  GLUCAP 118* 276* 345* 383* 197*       Signed:  CHIU, STEPHEN K  [facility] 10/02/2013, 10:49 AM

## 2013-10-02 NOTE — Progress Notes (Signed)
Patient's AVS listed sliding scale insulin.  RN asked Dr. Wyline Copas about the patient's sliding scale Humalog insulin order.  Dr. Wyline Copas stated that patient should restart her home dose of Humalog sliding scale like she was on at home.

## 2013-10-06 ENCOUNTER — Encounter: Payer: Self-pay | Admitting: *Deleted

## 2013-10-09 ENCOUNTER — Encounter: Payer: Self-pay | Admitting: Family Medicine

## 2013-10-09 ENCOUNTER — Ambulatory Visit (INDEPENDENT_AMBULATORY_CARE_PROVIDER_SITE_OTHER): Payer: Medicare Other | Admitting: Family Medicine

## 2013-10-09 ENCOUNTER — Ambulatory Visit (INDEPENDENT_AMBULATORY_CARE_PROVIDER_SITE_OTHER): Payer: Medicare Other

## 2013-10-09 ENCOUNTER — Other Ambulatory Visit: Payer: Self-pay

## 2013-10-09 VITALS — BP 121/64 | HR 73 | Temp 98.4°F | Ht 64.0 in | Wt 215.4 lb

## 2013-10-09 DIAGNOSIS — E876 Hypokalemia: Secondary | ICD-10-CM

## 2013-10-09 DIAGNOSIS — M19012 Primary osteoarthritis, left shoulder: Secondary | ICD-10-CM

## 2013-10-09 DIAGNOSIS — E1140 Type 2 diabetes mellitus with diabetic neuropathy, unspecified: Secondary | ICD-10-CM

## 2013-10-09 DIAGNOSIS — E1142 Type 2 diabetes mellitus with diabetic polyneuropathy: Secondary | ICD-10-CM

## 2013-10-09 DIAGNOSIS — E1149 Type 2 diabetes mellitus with other diabetic neurological complication: Secondary | ICD-10-CM

## 2013-10-09 DIAGNOSIS — I2510 Atherosclerotic heart disease of native coronary artery without angina pectoris: Secondary | ICD-10-CM

## 2013-10-09 DIAGNOSIS — N189 Chronic kidney disease, unspecified: Secondary | ICD-10-CM

## 2013-10-09 DIAGNOSIS — I5031 Acute diastolic (congestive) heart failure: Secondary | ICD-10-CM

## 2013-10-09 DIAGNOSIS — I509 Heart failure, unspecified: Secondary | ICD-10-CM

## 2013-10-09 DIAGNOSIS — E559 Vitamin D deficiency, unspecified: Secondary | ICD-10-CM

## 2013-10-09 DIAGNOSIS — J189 Pneumonia, unspecified organism: Secondary | ICD-10-CM

## 2013-10-09 DIAGNOSIS — E119 Type 2 diabetes mellitus without complications: Secondary | ICD-10-CM

## 2013-10-09 DIAGNOSIS — M19019 Primary osteoarthritis, unspecified shoulder: Secondary | ICD-10-CM

## 2013-10-09 DIAGNOSIS — I10 Essential (primary) hypertension: Secondary | ICD-10-CM

## 2013-10-09 LAB — POCT GLYCOSYLATED HEMOGLOBIN (HGB A1C): Hemoglobin A1C: 8.2

## 2013-10-09 MED ORDER — FUROSEMIDE 40 MG PO TABS: 40.0000 mg | ORAL_TABLET | Freq: Every day | ORAL | Status: DC

## 2013-10-09 NOTE — Progress Notes (Signed)
Patient ID: [name], female   DOB: September 06, 1951, 62 y.o.   MRN: [id number] SUBJECTIVE: CC: Chief Complaint  Patient presents with  . Hospitalization Follow-up    pneumonia    HPI: Here for follow up of pneumonia , heart failure and hypokalemia. She is feeling better and breathing without difficulty.  Past Medical History  Diagnosis Date  . Esophageal stricture   . Hyperlipidemia   . Hypertension   . Bronchitis   . CAD (coronary artery disease)     s/p Stenting in 2005;  [place] 7/09: Vigorous LV function, 2-3+ MR, RI 40%, RI stent patent, proximal-mid RCA 40-50%;  Echo 7/09:  EF 55-65%, trivial MR;  Myoview 11/18/12: EF 66%, normal LV wall motion, mild to moderate anterior ischemia - reviewed by [facility] and felt to rep breast atten (low risk);  Echo 6/14: mild LVH, EF 55-65%, Gr 2 DD, PASP 44, trivial eff   . Arthritis   . Non-insulin dependent diabetes mellitus   . Diverticulosis   . Diabetic neuropathy   . Vitamin D deficiency   . Degenerative joint disease     left shoulder  . Chronic neck pain   . Shortness of breath     at night  . Headache(784.0)   . Anemia     hx of  . Stroke     "mini- years ago"  . Chronic kidney disease     CREATININE IS UP--GOING TO KIDNEY MD    Past Surgical History  Procedure Laterality Date  . Bladder surgery      Tack  . Partial hysterectomy    . Shoulder surgery      Right  . Cervical spine surgery  2012    8 screws  . Abdominal hysterectomy    . Laminectomy  12/29/2011  . Cardiac stents    . Lumbar laminectomy/decompression microdiscectomy  12/29/2011    Procedure: LUMBAR LAMINECTOMY/DECOMPRESSION MICRODISCECTOMY;  Surgeon: Floyce Stakes, MD;  Location: [facility]URO ORS;  Service: Neurosurgery;  Laterality: N/A;  Lumbar Two through Lumbar Five Laminectomies/Cellsaver  . Back surgery      2007 & 2013  . Cystoscopy with injection N/A 06/03/2013    Procedure: MACROPLASTIQUE  INJECTION ;  Surgeon: Malka So, MD;  Location: [facility] ORS;   Service: Urology;  Laterality: N/A;  . Cardiac catheterization  2003, 2005, 2009   History   Social History  . Marital Status: Widowed    Spouse Name: N/A    Number of Children: N/A  . Years of Education: N/A   Occupational History  . Not on file.   Social History Main Topics  . Smoking status: Former Smoker -- 1.00 packs/day for 30 years    Quit date: 12/04/2001  . Smokeless tobacco: Never Used  . Alcohol Use: Yes     Comment: occasional  . Drug Use: No  . Sexual Activity: No   Other Topics Concern  . Not on file   Social History Narrative   Lives in [place]   Family History  Problem Relation Age of Onset  . Heart disease Other    Current Outpatient Prescriptions on File Prior to Visit  Medication Sig Dispense Refill  . albuterol (PROVENTIL HFA;VENTOLIN HFA) 108 (90 BASE) MCG/ACT inhaler Inhale 2 puffs into the lungs every 6 (six) hours as needed for wheezing.      [name] aspirin 81 MG EC tablet Take 81 mg by mouth daily.        [name] atenolol (TENORMIN) 50  MG tablet Take 50 mg by mouth 2 (two) times daily.      . cholecalciferol (VITAMIN D) 1000 UNITS tablet Take 1,000 Units by mouth every morning.      . gabapentin (NEURONTIN) 300 MG capsule Take 300 mg by mouth 3 (three) times daily.      . insulin detemir (LEVEMIR) 100 UNIT/ML injection Inject 60 Units into the skin at bedtime.       . insulin lispro (HUMALOG) 100 UNIT/ML injection Inject into the skin See admin instructions. To be administered as directed per sliding scale: 90-150= 10  Units 151-200=11 units 201-250=12 units 251-300= 13 units  To call MD if levels are between 251-300 for three consecutive days      . levofloxacin (LEVAQUIN) 750 MG tablet Take 1 tablet (750 mg total) by mouth every other day.  7 tablet  0  . linagliptin (TRADJENTA) 5 MG TABS tablet Take 5 mg by mouth daily.      [name] NIFEdipine (PROCARDIA XL/ADALAT-CC) 90 MG 24 hr tablet Take 90 mg by mouth every evening.       . potassium chloride  (K-DUR) 10 MEQ tablet Take 20 mEq by mouth daily.      . predniSONE (DELTASONE) 20 MG tablet Take 3 tablets (60 mg total) by mouth daily with breakfast.      . rosuvastatin (CRESTOR) 20 MG tablet Take 1 tablet (20 mg total) by mouth every evening.  30 tablet  11  . sorbitol 70 % SOLN Take 30 mLs by mouth daily as needed.  1 Bottle  0  . valsartan-hydrochlorothiazide (DIOVAN-HCT) 320-25 MG per tablet Take 1 tablet by mouth every morning.  30 tablet  11   No current facility-administered medications on file prior to visit.   Allergies  Allergen Reactions  . Ace Inhibitors     unknown  . Clopidogrel Bisulfate     REACTION: Sick, Headache, "Felt terrible"  . Codeine Other (See Comments)    REACTION: hallucinations  . Penicillins     unknown   Immunization History  Administered Date(s) Administered  . DTaP 07/19/2010  . Influenza Whole 09/03/2006  . Influenza,inj,Quad PF,36+ Mos 09/11/2013   Prior to Admission medications   Medication Sig Start Date End Date Taking? Authorizing Provider  albuterol (PROVENTIL HFA;VENTOLIN HFA) 108 (90 BASE) MCG/ACT inhaler Inhale 2 puffs into the lungs every 6 (six) hours as needed for wheezing.    Historical Provider, MD  aspirin 81 MG EC tablet Take 81 mg by mouth daily.      Historical Provider, MD  atenolol (TENORMIN) 50 MG tablet Take 50 mg by mouth 2 (two) times daily.    Historical Provider, MD  cholecalciferol (VITAMIN D) 1000 UNITS tablet Take 1,000 Units by mouth every morning.    Historical Provider, MD  furosemide (LASIX) 40 MG tablet Take 1 tablet (40 mg total) by mouth daily. 10/02/13   Donne Hazel, MD  gabapentin (NEURONTIN) 300 MG capsule Take 300 mg by mouth 3 (three) times daily.    Historical Provider, MD  insulin detemir (LEVEMIR) 100 UNIT/ML injection Inject 60 Units into the skin at bedtime.     Historical Provider, MD  insulin lispro (HUMALOG) 100 UNIT/ML injection Inject into the skin See admin instructions. To be administered  as directed per sliding scale: 90-150= 10  Units 151-200=11 units 201-250=12 units 251-300= 13 units  To call MD if levels are between 251-300 for three consecutive days    Historical Provider, MD  levofloxacin (  LEVAQUIN) 750 MG tablet Take 1 tablet (750 mg total) by mouth every other day. 10/02/13   Donne Hazel, MD  linagliptin (TRADJENTA) 5 MG TABS tablet Take 5 mg by mouth daily.    Historical Provider, MD  NIFEdipine (PROCARDIA XL/ADALAT-CC) 90 MG 24 hr tablet Take 90 mg by mouth every evening.     Historical Provider, MD  potassium chloride (K-DUR) 10 MEQ tablet Take 20 mEq by mouth daily.    Historical Provider, MD  predniSONE (DELTASONE) 20 MG tablet Take 3 tablets (60 mg total) by mouth daily with breakfast. 10/02/13   Donne Hazel, MD  rosuvastatin (CRESTOR) 20 MG tablet Take 1 tablet (20 mg total) by mouth every evening. 07/30/13   Vernie Shanks, MD  sorbitol 70 % SOLN Take 30 mLs by mouth daily as needed. 10/02/13   Donne Hazel, MD  valsartan-hydrochlorothiazide (DIOVAN-HCT) 320-25 MG per tablet Take 1 tablet by mouth every morning. 07/30/13   Vernie Shanks, MD     ROS: As above in the HPI. All other systems are stable or negative.  OBJECTIVE: APPEARANCE:  Patient in no acute distress.The patient appeared well nourished and normally developed. Acyanotic. Waist: VITAL SIGNS:BP 121/64  Pulse 73  Temp(Src) 98.4 F (36.9 C) (Oral)  Ht 5\' 4"  (1.626 m)  Wt 215 lb 6.4 oz (97.705 kg)  BMI 36.96 kg/m2  AAF obese SKIN: warm and  Dry without overt rashes, tattoos and scars  HEAD and Neck: without JVD, Head and scalp: normal Eyes:No scleral icterus. Fundi normal, eye movements normal. Ears: Auricle normal, canal normal, Tympanic membranes normal, insufflation normal. Nose: normal Throat: normal Neck & thyroid: normal  CHEST & LUNGS: Chest wall: normal Lungs: Clear  CVS: Reveals the PMI to be normally located. Regular rhythm, First and Second Heart sounds are  normal,  absence of murmurs, rubs or gallops. Peripheral vasculature: Radial pulses: normal Dorsal pedis pulses: normal Posterior pulses: normal  ABDOMEN:  Appearance: Obese Benign, no organomegaly, no masses, no Abdominal Aortic enlargement. No Guarding , no rebound. No Bruits. Bowel sounds: normal  RECTAL: N/A GU: N/A  EXTREMETIES: nonedematous.   NEUROLOGIC: oriented to time,place and person; nonfocal.   ASSESSMENT:  Acute diastolic CHF (congestive heart failure) - Plan: furosemide (LASIX) 40 MG tablet, TSH  CAD - Plan: furosemide (LASIX) 40 MG tablet, NMR, lipoprofile  Community acquired pneumonia - Plan: DG Chest 2 View  CKD (chronic kidney disease)  Diabetic neuropathy  Degenerative joint disease of left shoulder  DM - Plan: POCT glycosylated hemoglobin (Hb A1C), CMP14+EGFR  HYPERTENSION, BENIGN - Plan: CMP14+EGFR  Hypokalemia  Vitamin D deficiency - Plan: Vit D  25 hydroxy (rtn osteoporosis monitoring)  PLAN:   [facility] reading (PRIMARY) by  Dr. Jacelyn Grip: cardiomegaly, clearing of the infiltrate.                                 Orders Placed This Encounter  Procedures  . DG Chest 2 View    Standing Status: Future     Number of Occurrences: 1     Standing Expiration Date: 12/09/2014    Order Specific Question:  Reason for Exam (SYMPTOM  OR DIAGNOSIS REQUIRED)    Answer:  follow up pneumonia    Order Specific Question:  Preferred imaging location?    Answer:  Internal  . CMP14+EGFR  . NMR, lipoprofile  . TSH  . Vit D  25 hydroxy (rtn  osteoporosis monitoring)  . POCT glycosylated hemoglobin (Hb A1C)   Meds ordered this encounter  Medications  . furosemide (LASIX) 40 MG tablet    Sig: Take 1 tablet (40 mg total) by mouth daily. Take an extra pill if weight is above 3 Lbs.    Dispense:  30 tablet    Refill:  5   Medications Discontinued During This Encounter  Medication Reason  . furosemide (LASIX) 40 MG tablet Reorder  refilled and instructed on  the use of her diuretic especially with weight gain.       Dr Paula Libra Recommendations  For nutrition information, I recommend books:  1).Eat to Live by Dr Excell Seltzer. 2).Prevent and Reverse Heart Disease by Dr Karl Luke. 3) Dr Janene Harvey Book:  Program to Reverse Diabetes  Exercise recommendations are:  If unable to walk, then the patient can exercise in a chair 3 times a day. By flapping arms like a bird gently and raising legs outwards to the front.  If ambulatory, the patient can go for walks for 30 minutes 3 times a week. Then increase the intensity and duration as tolerated.  Goal is to try to attain exercise frequency to 5 times a week.  If applicable: Best to perform resistance exercises (machines or weights) 2 days a week and cardio type exercises 3 days per week.  Handouts on Heart failure and DM footcare Return in about 6 weeks (around 11/20/2013) for Recheck medical problems.  Lavonta Tillis P. Jacelyn Grip, M.D.

## 2013-10-09 NOTE — Patient Instructions (Signed)
Heart Failure Heart failure is a condition in which the heart has trouble pumping blood. This means your heart does not pump blood efficiently for your body to work well. In some cases of heart failure, fluid may back up into your lungs or you may have swelling (edema) in your lower legs. Heart failure is usually a long-term (chronic) condition. It is important for you to take good care of yourself and follow your caregiver's treatment plan. CAUSES  Some health conditions can cause heart failure. Those health conditions include:  High blood pressure (hypertension) causes the heart muscle to work harder than normal. When pressure in the blood vessels is high, the heart needs to pump (contract) with more force in order to circulate blood throughout the body. High blood pressure eventually causes the heart to become stiff and weak.  Coronary artery disease (CAD) is the buildup of cholesterol and fat (plaque) in the arteries of the heart. The blockage in the arteries deprives the heart muscle of oxygen and blood. This can cause chest pain and may lead to a heart attack. High blood pressure can also contribute to CAD.  Heart attack (myocardial infarction) occurs when 1 or more arteries in the heart become blocked. The loss of oxygen damages the muscle tissue of the heart. When this happens, part of the heart muscle dies. The injured tissue does not contract as well and weakens the heart's ability to pump blood.  Abnormal heart valves can cause heart failure when the heart valves do not open and close properly. This makes the heart muscle pump harder to keep the blood flowing.  Heart muscle disease (cardiomyopathy or myocarditis) is damage to the heart muscle from a variety of causes. These can include drug or alcohol abuse, infections, or unknown reasons. These can increase the risk of heart failure.  Lung disease makes the heart work harder because the lungs do not work properly. This can cause a strain  on the heart, leading it to fail.  Diabetes increases the risk of heart failure. High blood sugar contributes to high fat (lipid) levels in the blood. Diabetes can also cause slow damage to tiny blood vessels that carry important nutrients to the heart muscle. When the heart does not get enough oxygen and food, it can cause the heart to become weak and stiff. This leads to a heart that does not contract efficiently.  Other conditions can contribute to heart failure. These include abnormal heart rhythms, thyroid problems, and low blood counts (anemia). Certain unhealthy behaviors can increase the risk of heart failure. Those unhealthy behaviors include:  Being overweight.  Smoking or chewing tobacco.  Eating foods high in fat and cholesterol.  Abusing illicit drugs or alcohol.  Lacking physical activity. SYMPTOMS  Heart failure symptoms may vary and can be hard to detect. Symptoms may include:  Shortness of breath with activity, such as climbing stairs.  Persistent cough.  Swelling of the feet, ankles, legs, or abdomen.  Unexplained weight gain.  Difficulty breathing when lying flat (orthopnea).  Waking from sleep because of the need to sit up and get more air.  Rapid heartbeat.  Fatigue and loss of energy.  Feeling lightheaded, dizzy, or close to fainting.  Loss of appetite.  Nausea.  Increased urination during the night (nocturia). DIAGNOSIS  A diagnosis of heart failure is based on your history, symptoms, physical examination, and diagnostic tests. Diagnostic tests for heart failure may include:  Echocardiography.  Electrocardiography.  Chest X-ray.  Blood tests.  Exercise   stress test.  Cardiac angiography.  Radionuclide scans. TREATMENT  Treatment is aimed at managing the symptoms of heart failure. Medicines, behavioral changes, or surgical intervention may be necessary to treat heart failure.  Medicines to help treat heart failure may  include:  Angiotensin-converting enzyme (ACE) inhibitors. This type of medicine blocks the effects of a blood protein called angiotensin-converting enzyme. ACE inhibitors relax (dilate) the blood vessels and help lower blood pressure.  Angiotensin receptor blockers. This type of medicine blocks the actions of a blood protein called angiotensin. Angiotensin receptor blockers dilate the blood vessels and help lower blood pressure.  Water pills (diuretics). Diuretics cause the kidneys to remove salt and water from the blood. The extra fluid is removed through urination. This loss of extra fluid lowers the volume of blood the heart pumps.  Beta blockers. These prevent the heart from beating too fast and improve heart muscle strength.  Digitalis. This increases the force of the heartbeat.  Healthy behavior changes include:  Obtaining and maintaining a healthy weight.  Stopping smoking or chewing tobacco.  Eating heart healthy foods.  Limiting or avoiding alcohol.  Stopping illicit drug use.  Physical activity as directed by your caregiver.  Surgical treatment for heart failure may include:  A procedure to open blocked arteries, repair damaged heart valves, or remove damaged heart muscle tissue.  A pacemaker to improve heart muscle function and control certain abnormal heart rhythms.  An internal cardioverter defibrillator to treat certain serious abnormal heart rhythms.  A left ventricular assist device to assist the pumping ability of the heart. HOME CARE INSTRUCTIONS   Take your medicine as directed by your caregiver. Medicines are important in reducing the workload of your heart, slowing the progression of heart failure, and improving your symptoms.  Do not stop taking your medicine unless directed by your caregiver.  Do not skip any dose of medicine.  Refill your prescriptions before you run out of medicine. Your medicines are needed every day.  Take over-the-counter  medicine only as directed by your caregiver or pharmacist.  Engage in moderate physical activity if directed by your caregiver. Moderate physical activity can benefit some people. The elderly and people with severe heart failure should consult with a caregiver for physical activity recommendations.  Eat heart healthy foods. Food choices should be free of trans fat and low in saturated fat, cholesterol, and salt (sodium). Healthy choices include fresh or frozen fruits and vegetables, fish, lean meats, legumes, fat-free or low-fat dairy products, and whole grain or high fiber foods. Talk to a dietitian to learn more about heart healthy foods.  Limit sodium if directed by your caregiver. Sodium restriction may reduce symptoms of heart failure in some people. Talk to a dietitian to learn more about heart healthy seasonings.  Use healthy cooking methods. Healthy cooking methods include roasting, grilling, broiling, baking, poaching, steaming, or stir-frying. Talk to a dietitian to learn more about healthy cooking methods.  Limit fluids if directed by your caregiver. Fluid restriction may reduce symptoms of heart failure in some people.  Weigh yourself every day. Daily weights are important in the early recognition of excess fluid. You should weigh yourself every morning after you urinate and before you eat breakfast. Wear the same amount of clothing each time you weigh yourself. Record your daily weight. Provide your caregiver with your weight record.  Monitor and record your blood pressure if directed by your caregiver.  Check your pulse if directed by your caregiver.  Lose weight if directed  by your caregiver. Weight loss may reduce symptoms of heart failure in some people.  Stop smoking or chewing tobacco. Nicotine makes your heart work harder by causing your blood vessels to constrict. Do not use nicotine gum or patches before talking to your caregiver.  Schedule and attend follow-up visits as  directed by your caregiver. It is important to keep all your appointments.  Limit alcohol intake to no more than 1 drink per day for nonpregnant women and 2 drinks per day for men. Drinking more than that is harmful to your heart. Tell your caregiver if you drink alcohol several times a week. Talk with your caregiver about whether alcohol is safe for you. If your heart has already been damaged by alcohol or you have severe heart failure, drinking alcohol should be stopped completely.  Stop illicit drug use.  Stay up-to-date with immunizations. It is especially important to prevent respiratory infections through current pneumococcal and influenza immunizations.  Manage other health conditions such as hypertension, diabetes, thyroid disease, or abnormal heart rhythms as directed by your caregiver.  Learn to manage stress.  Plan rest periods when fatigued.  Learn strategies to manage high temperatures. If the weather is extremely hot:  Avoid vigorous physical activity.  Use air conditioning or fans or seek a cooler location.  Avoid caffeine and alcohol.  Wear loose-fitting, lightweight, and light-colored clothing.  Learn strategies to manage cold temperatures. If the weather is extremely cold:  Avoid vigorous physical activity.  Layer clothes.  Wear mittens or gloves, a hat, and a scarf when going outside.  Avoid alcohol.  Obtain ongoing education and support as needed.  Participate or seek rehabilitation as needed to maintain or improve independence and quality of life. SEEK MEDICAL CARE IF:   Your weight increases by 03 lb/1.4 kg in 1 day or 05 lb/2.3 kg in a week.  You have increasing shortness of breath that is unusual for you.  You are unable to participate in your usual physical activities.  You tire easily.  You cough more than normal, especially with physical activity.  You have any or more swelling in areas such as your hands, feet, ankles, or abdomen.  You  are unable to sleep because it is hard to breathe.  You feel like your heart is beating fast (palpitations).  You become dizzy or lightheaded upon standing up. SEEK IMMEDIATE MEDICAL CARE IF:   You have difficulty breathing.  There is a change in mental status such as decreased alertness or difficulty with concentration.  You have a pain or discomfort in your chest.  You have an episode of fainting (syncope). MAKE SURE YOU:   Understand these instructions.  Will watch your condition.  Will get help right away if you are not doing well or get worse. Document Released: 11/20/2005 Document Revised: 03/17/2013 Document Reviewed: 12/12/2012 [facility] Patient Information 2014 [place], [place].         Dr Paula Libra Recommendations  For nutrition information, I recommend books:  1).Eat to Live by Dr Excell Seltzer. 2).Prevent and Reverse Heart Disease by Dr Karl Luke. 3) Dr Janene Harvey Book:  Program to Reverse Diabetes  Exercise recommendations are:  If unable to walk, then the patient can exercise in a chair 3 times a day. By flapping arms like a bird gently and raising legs outwards to the front.  If ambulatory, the patient can go for walks for 30 minutes 3 times a week. Then increase the intensity and duration as tolerated.  Goal  is to try to attain exercise frequency to 5 times a week.  If applicable: Best to perform resistance exercises (machines or weights) 2 days a week and cardio type exercises 3 days per week.   Diabetes and Foot Care Diabetes may cause you to have problems because of poor blood supply (circulation) to your feet and legs. This may cause the skin on your feet to become thinner, break easier, and heal more slowly. Your skin may become dry, and the skin may peel and crack. You may also have nerve damage in your legs and feet causing decreased feeling in them. You may not notice minor injuries to your feet that could lead to infections or more  serious problems. Taking care of your feet is one of the most important things you can do for yourself.  HOME CARE INSTRUCTIONS  Wear shoes at all times, even in the house. Do not go barefoot. Bare feet are easily injured.  Check your feet daily for blisters, cuts, and redness. If you cannot see the bottom of your feet, use a mirror or ask someone for help.  Wash your feet with warm water (do not use hot water) and mild soap. Then pat your feet and the areas between your toes until they are completely dry. Do not soak your feet as this can dry your skin.  Apply a moisturizing lotion or petroleum jelly (that does not contain alcohol and is unscented) to the skin on your feet and to dry, brittle toenails. Do not apply lotion between your toes.  Trim your toenails straight across. Do not dig under them or around the cuticle. File the edges of your nails with an emery board or nail file.  Do not cut corns or calluses or try to remove them with medicine.  Wear clean socks or stockings every day. Make sure they are not too tight. Do not wear knee-high stockings since they may decrease blood flow to your legs.  Wear shoes that fit properly and have enough cushioning. To break in new shoes, wear them for just a few hours a day. This prevents you from injuring your feet. Always look in your shoes before you put them on to be sure there are no objects inside.  Do not cross your legs. This may decrease the blood flow to your feet.  If you find a minor scrape, cut, or break in the skin on your feet, keep it and the skin around it clean and dry. These areas may be cleansed with mild soap and water. Do not cleanse the area with peroxide, alcohol, or iodine.  When you remove an adhesive bandage, be sure not to damage the skin around it.  If you have a wound, look at it several times a day to make sure it is healing.  Do not use heating pads or hot water bottles. They may burn your skin. If you have lost  feeling in your feet or legs, you may not know it is happening until it is too late.  Make sure your health care provider performs a complete foot exam at least annually or more often if you have foot problems. Report any cuts, sores, or bruises to your health care provider immediately. SEEK MEDICAL CARE IF:   You have an injury that is not healing.  You have cuts or breaks in the skin.  You have an ingrown nail.  You notice redness on your legs or feet.  You feel burning or tingling in your  legs or feet.  You have pain or cramps in your legs and feet.  Your legs or feet are numb.  Your feet always feel cold. SEEK IMMEDIATE MEDICAL CARE IF:   There is increasing redness, swelling, or pain in or around a wound.  There is a red line that goes up your leg.  Pus is coming from a wound.  You develop a fever or as directed by your health care provider.  You notice a bad smell coming from an ulcer or wound. Document Released: 11/17/2000 Document Revised: 07/23/2013 Document Reviewed: 04/29/2013 [facility] Patient Information 2014 [place].

## 2013-10-11 LAB — CMP14+EGFR
ALT: 34 IU/L — ABNORMAL HIGH (ref 0–32)
AST: 18 IU/L (ref 0–40)
Albumin/Globulin Ratio: 1.6 (ref 1.1–2.5)
Albumin: 3.7 g/dL (ref 3.6–4.8)
Alkaline Phosphatase: 88 IU/L (ref 39–117)
BUN/Creatinine Ratio: 20 (ref 11–26)
BUN: 46 mg/dL — ABNORMAL HIGH (ref 8–27)
CO2: 27 mmol/L (ref 18–29)
Calcium: 10.1 mg/dL (ref 8.6–10.2)
Chloride: 99 mmol/L (ref 97–108)
Creatinine, Ser: 2.28 mg/dL — ABNORMAL HIGH (ref 0.57–1.00)
GFR calc Af Amer: 26 mL/min/{1.73_m2} — ABNORMAL LOW (ref >59)
GFR calc non Af Amer: 23 mL/min/{1.73_m2} — ABNORMAL LOW (ref >59)
Globulin, Total: 2.3 g/dL (ref 1.5–4.5)
Glucose: 209 mg/dL — ABNORMAL HIGH (ref 65–99)
Potassium: 4.2 mmol/L (ref 3.5–5.2)
Sodium: 141 mmol/L (ref 134–144)
Total Bilirubin: <0.2 mg/dL (ref 0.0–1.2)
Total Protein: 6 g/dL (ref 6.0–8.5)

## 2013-10-11 LAB — NMR, LIPOPROFILE
Cholesterol: 219 mg/dL — ABNORMAL HIGH (ref <200)
HDL Cholesterol by NMR: 65 mg/dL (ref >=40)
HDL Particle Number: 47.6 umol/L (ref >=30.5)
LDL Particle Number: 1955 nmol/L — ABNORMAL HIGH (ref <1000)
LDL Size: 21.1 nm (ref >20.5)
LDLC SERPL CALC-MCNC: 125 mg/dL — ABNORMAL HIGH (ref <100)
LP-IR Score: 44 (ref <=45)
Small LDL Particle Number: 578 nmol/L — ABNORMAL HIGH (ref <=527)
Triglycerides by NMR: 147 mg/dL (ref <150)

## 2013-10-11 LAB — VITAMIN D 25 HYDROXY (VIT D DEFICIENCY, FRACTURES): Vit D, 25-Hydroxy: 26.1 ng/mL — ABNORMAL LOW (ref 30.0–100.0)

## 2013-10-11 LAB — TSH: TSH: 0.904 u[IU]/mL (ref 0.450–4.500)

## 2013-10-14 ENCOUNTER — Ambulatory Visit (INDEPENDENT_AMBULATORY_CARE_PROVIDER_SITE_OTHER): Payer: Medicare Other | Admitting: Nurse Practitioner

## 2013-10-14 ENCOUNTER — Encounter: Payer: Self-pay | Admitting: Nurse Practitioner

## 2013-10-14 VITALS — BP 146/70 | HR 72 | Ht 62.0 in | Wt 215.8 lb

## 2013-10-14 DIAGNOSIS — I5032 Chronic diastolic (congestive) heart failure: Secondary | ICD-10-CM

## 2013-10-14 NOTE — Patient Instructions (Addendum)
Stay on your current medicines but I want you to get some over the counter Prilosec, Nexium or Prevacid and take for 2 weeks  Try Miralax instead for your constipation  Keep cutting back on the salt and weighing daily  See Dr. Percival Spanish back as planned in December for your regular office visit.   Call the [facility] office at [phone] if you have any questions, problems or concerns.

## 2013-10-14 NOTE — Progress Notes (Signed)
[name] Date of Birth: 04-12-1951 Medical Record [id number]  History of Present Illness: [name] is seen back today for a post hospital visit. Seen for Dr. Percival Spanish. She has a hx of CAD, status post PCI in 2005, DM2, HTN, HL, esophageal stricture, CKD. LHC 7/09: Vigorous LV function, 2-3+ MR, RI 40%, RI stent patent, proximal-mid RCA 40-50%. Echo 05/2013 with EF [id number], grade 2 diastolic dysfunction. Renal artery dopplers 3/13: patent bilat, < 60% stenosis. Last seen by Dr. Percival Spanish 11/2012 for preoperative evaluation prior to shoulder surgery. Lexiscan Myoview 11/18/12: EF 66%, normal LV wall motion, mild to moderate anterior ischemia. Study was compared with study done in 2012. Anterior defect was felt to be breast attenuation at that time. Her current study appeared to be unchanged. Study was felt to be low risk by Dr. Percival Spanish she was cleared for surgery. She never had her shoulder surgery.   Seen back in June 2014 by Richardson Dopp, PA for surgical clearance for macroplastique injections for bladder incontinence with general anesthesia. She was having ncreased dyspnea and chest heaviness at times that may be worse with exertion but her PCP stopped her neurontin and her symptoms significantly improved. Was to see Dr. Percival Spanish back in December.   Was hospitalized back at the end of October with worsening SOB. [place] the beach. BNP was 1856. She was felt to have CAP with acute on chronic diastolic HF. She was treated with antibiotics, Lasix and oxygen. D dimer was elevated and she had a negative VQ scan. Discharged 10/02/13.  Comes back today. Here alone. Doing ok. She says her breathing has gotten better. No cough - but never had. Has a deep discomfort in her chest - constant - not exertional - has been on high dose steroid and Levaquin. Nothing like her prior chest pain syndrome. Finished with antibiotic as of today. Tapering her prednisone. Notes that the steroids has made  all of her joint pain go away. No swelling. Weight fairly stable. Has already had repeat labs per PCP. Left on her Lasix. Sees him back next month. Due for her follow up with Dr. Percival Spanish next month as well. Overall she feels like she is doing well. Still having issues with constipation - sorbitol not working - says Miralax helped her more.   Current Outpatient Prescriptions  Medication Sig Dispense Refill  . albuterol (PROVENTIL HFA;VENTOLIN HFA) 108 (90 BASE) MCG/ACT inhaler Inhale 2 puffs into the lungs every 6 (six) hours as needed for wheezing.      [name] aspirin 81 MG EC tablet Take 81 mg by mouth daily.        [name] atenolol (TENORMIN) 50 MG tablet Take 50 mg by mouth 2 (two) times daily.      . cholecalciferol (VITAMIN D) 1000 UNITS tablet Take 1,000 Units by mouth every morning.      . furosemide (LASIX) 40 MG tablet Take 1 tablet (40 mg total) by mouth daily. Take an extra pill if weight is above 3 Lbs.  30 tablet  5  . gabapentin (NEURONTIN) 300 MG capsule Take 300 mg by mouth 3 (three) times daily.      . insulin detemir (LEVEMIR) 100 UNIT/ML injection Inject 60 Units into the skin at bedtime.       . insulin lispro (HUMALOG) 100 UNIT/ML injection Inject into the skin See admin instructions. To be administered as directed per sliding scale: 90-150= 10  Units 151-200=11 units 201-250=12 units 251-300= 13 units  To call MD if levels are between 251-300 for three consecutive days      . levofloxacin (LEVAQUIN) 750 MG tablet Take 1 tablet (750 mg total) by mouth every other day.  7 tablet  0  . linagliptin (TRADJENTA) 5 MG TABS tablet Take 5 mg by mouth daily.      [name] NIFEdipine (PROCARDIA XL/ADALAT-CC) 90 MG 24 hr tablet Take 90 mg by mouth every evening.       . potassium chloride (K-DUR) 10 MEQ tablet Take 20 mEq by mouth daily.      . predniSONE (DELTASONE) 20 MG tablet Take 3 tablets (60 mg total) by mouth daily with breakfast.      . rosuvastatin (CRESTOR) 20 MG tablet Take 1 tablet (20 mg  total) by mouth every evening.  30 tablet  11  . sorbitol 70 % SOLN Take 30 mLs by mouth daily as needed.  1 Bottle  0  . valsartan-hydrochlorothiazide (DIOVAN-HCT) 320-25 MG per tablet Take 1 tablet by mouth every morning.  30 tablet  11   No current facility-administered medications for this visit.    Allergies  Allergen Reactions  . Ace Inhibitors     unknown  . Clopidogrel Bisulfate     REACTION: Sick, Headache, "Felt terrible"  . Codeine Other (See Comments)    REACTION: hallucinations  . Penicillins     unknown    Past Medical History  Diagnosis Date  . Esophageal stricture   . Hyperlipidemia   . Hypertension   . Bronchitis   . CAD (coronary artery disease)     s/p Stenting in 2005;  [place] 7/09: Vigorous LV function, 2-3+ MR, RI 40%, RI stent patent, proximal-mid RCA 40-50%;  Echo 7/09:  EF 55-65%, trivial MR;  Myoview 11/18/12: EF 66%, normal LV wall motion, mild to moderate anterior ischemia - reviewed by [employer] and felt to rep breast atten (low risk);  Echo 6/14: mild LVH, EF 55-65%, Gr 2 DD, PASP 44, trivial eff   . Arthritis   . Non-insulin dependent diabetes mellitus   . Diverticulosis   . Diabetic neuropathy   . Vitamin D deficiency   . Degenerative joint disease     left shoulder  . Chronic neck pain   . Shortness of breath     at night  . Headache(784.0)   . Anemia     hx of  . Stroke     "mini- years ago"  . Chronic kidney disease     CREATININE IS UP--GOING TO KIDNEY MD     Past Surgical History  Procedure Laterality Date  . Bladder surgery      Tack  . Partial hysterectomy    . Shoulder surgery      Right  . Cervical spine surgery  2012    8 screws  . Abdominal hysterectomy    . Laminectomy  12/29/2011  . Cardiac stents    . Lumbar laminectomy/decompression microdiscectomy  12/29/2011    Procedure: LUMBAR LAMINECTOMY/DECOMPRESSION MICRODISCECTOMY;  Surgeon: Floyce Stakes, MD;  Location: [facility];  Service: Neurosurgery;  Laterality: N/A;   Lumbar Two through Lumbar Five Laminectomies/Cellsaver  . Back surgery      2007 & 2013  . Cystoscopy with injection N/A 06/03/2013    Procedure: MACROPLASTIQUE  INJECTION ;  Surgeon: Malka So, MD;  Location: [facility];  Service: Urology;  Laterality: N/A;  . Cardiac catheterization  2003, 2005, 2009    History  Smoking status  .  Former Smoker -- 1.00 packs/day for 30 years  . Quit date: 12/04/2001  Smokeless tobacco  . Never Used    History  Alcohol Use  . Yes    Comment: occasional    Family History  Problem Relation Age of Onset  . Heart disease Other     Review of Systems: The review of systems is per the HPI.  All other systems were reviewed and are negative.  Physical Exam: BP 146/70  Pulse 72  Ht 5\' 2"  (1.575 m)  Wt 215 lb 12.8 oz (97.886 kg)  BMI 39.46 kg/m2 Patient is very pleasant and in no acute distress. She is obese. Skin is warm and dry. Color is normal.  HEENT is unremarkable. Normocephalic/atraumatic. PERRL. Sclera are nonicteric. Neck is supple. No masses. No JVD. Lungs are clear. Cardiac exam shows a regular rate and rhythm. Abdomen is obese but soft. Extremities are without edema. Gait and ROM are intact. No gross neurologic deficits noted.  Wt Readings from Last 3 Encounters:  10/14/13 215 lb 12.8 oz (97.886 kg)  10/09/13 215 lb 6.4 oz (97.705 kg)  10/02/13 213 lb 13.5 oz (97 kg)     LABORATORY DATA: Lab Results  Component Value Date   WBC 10.6* 10/01/2013   HGB 11.0* 10/01/2013   HCT 34.0* 10/01/2013   PLT 287 10/01/2013   GLUCOSE 209* 10/09/2013   CHOL 219* 10/09/2013   TRIG 209* 03/25/2013   HDL 57 06/17/2008   LDLCALC 148* 03/25/2013   ALT 34* 10/09/2013   AST 18 10/09/2013   NA 141 10/09/2013   K 4.2 10/09/2013   CL 99 10/09/2013   CREATININE 2.28* 10/09/2013   BUN 46* 10/09/2013   CO2 27 10/09/2013   TSH 0.904 10/09/2013   INR 1.0 06/17/2008   HGBA1C 8.2% 10/09/2013   Dg Chest 2 View  10/10/2013   CLINICAL DATA:  Community acquired  pneumonia  EXAM: CHEST  2 VIEW  COMPARISON:  10/02/2013, 09/29/2013  FINDINGS: Enlargement of cardiac silhouette.  Mediastinal contours and pulmonary vascularity normal.  Resolution of previously identified infiltrates.  No acute infiltrate, pleural effusion or pneumothorax.  Prior cervical spine fusion.  Scattered endplate spur formation thoracic spine.  IMPRESSION: Enlargement of cardiac silhouette.  No acute abnormalities.   Electronically Signed   By: Lavonia Dana M.D.   On: 10/10/2013 12:56   Nm Pulmonary Perf And Vent  09/30/2013   CLINICAL DATA:  Short of breath, concern for pulmonary embolism  EXAM: NUCLEAR MEDICINE VENTILATION - PERFUSION LUNG SCAN  TECHNIQUE: Ventilation images were obtained in multiple projections using inhaled aerosol technetium 99 M DTPA. Perfusion images were obtained in multiple projections after intravenous injection of Tc-34m MAA.  COMPARISON:  Chest radiograph 09/29/2013  RADIOPHARMACEUTICALS:  40.0 mCi Tc-23m DTPA aerosol and 6.0 mCi Tc-71m MAA  FINDINGS: Ventilation: No focal ventilation defect.  Perfusion: No wedge shaped peripheral perfusion defects to suggest acute pulmonary embolism.  IMPRESSION: No evidence of acute pulmonary embolism. Normal perfusion scan.   Electronically Signed   By: Suzy Bouchard M.D.   On: 09/30/2013 11:24   Echo Study Conclusions from June 2014  - Left ventricle: The cavity size was normal. Wall thickness was increased in a pattern of mild LVH. Systolic function was normal. The estimated ejection fraction was in the range of 55% to 65%. Wall motion was normal; there were no regional wall motion abnormalities. Features are consistent with a pseudonormal left ventricular filling pattern, with concomitant abnormal relaxation and increased filling pressure (  grade 2 diastolic dysfunction). - Atrial septum: No defect or patent foramen ovale was identified. - Pulmonary arteries: PA peak pressure: 52mm Hg (S). - Pericardium,  extracardiac: A trivial pericardial effusion was identified.   Assessment / Plan: 1. Dyspnea - with recent admission - due to CAP and acute on chronic diastolic HF - improved clinically. To continue with current regimen. Finishing her antibiotics and tapering her steroids. Restrict salt and continue with daily weights.   2. CAD - with atypical chest pain - some pain on palpation of her chest wall - I suspect this is more related to the antibiotics and steroids - she in on no PPI therapy - will try some OTC PPI for 2 weeks.   3. CKD - sees renal.   4. HTN   Get her back to see Dr. Percival Spanish next month.   Patient is agreeable to this plan and will call if any problems develop in the interim.   Burtis Junes, RN, [place] [address] [place], [place]  [address]

## 2013-10-21 ENCOUNTER — Telehealth: Payer: Self-pay | Admitting: Cardiology

## 2013-10-22 NOTE — Telephone Encounter (Signed)
Error

## 2013-10-29 ENCOUNTER — Ambulatory Visit: Payer: Medicare Other | Admitting: Family Medicine

## 2013-10-31 ENCOUNTER — Ambulatory Visit: Payer: BC Managed Care – PPO | Admitting: Family Medicine

## 2013-11-19 ENCOUNTER — Ambulatory Visit (INDEPENDENT_AMBULATORY_CARE_PROVIDER_SITE_OTHER): Payer: Medicare Other | Admitting: Cardiology

## 2013-11-19 ENCOUNTER — Encounter: Payer: Self-pay | Admitting: Cardiology

## 2013-11-19 VITALS — BP 128/71 | HR 71 | Ht 62.0 in | Wt 221.0 lb

## 2013-11-19 DIAGNOSIS — I2510 Atherosclerotic heart disease of native coronary artery without angina pectoris: Secondary | ICD-10-CM

## 2013-11-19 NOTE — Progress Notes (Signed)
HPI The patient presents for follow up of CAD and diastolic heart failure.  I last saw her for preoperative clearance. However, she ultimately did not have shoulder surgery and her shoulder is feeling better.  She is here today to discuss followup after she was hospitalized with dyspnea. I have reviewed all these records. He was hospitalized in late October with shortness of breath and was thought maybe to have a community-acquired pneumonia. There was some suggestion of heart failure was slightly elevated BNP. D-dimer was elevated with a VQ scan was low yield. She ruled out for myocardial infarction. Echo demonstrated preserved ejection fraction.  Since that time the patient has not had acute dyspnea but she does feel that sometimes she still has some fullness or maybe difficulty breathing. She's not doing much walking. Unfortunately her insurance coverage will not include the [employer] going forward. She is watching her salt and weighing herself daily. She's not describing any new chest pressure, neck or arm discomfort. She's not describing any new palpitations, presyncope or syncope. She has no PND or orthopnea. She is not having cough fevers or chills.  Allergies  Allergen Reactions  . Ace Inhibitors     unknown  . Clopidogrel Bisulfate     REACTION: Sick, Headache, "Felt terrible"  . Codeine Other (See Comments)    REACTION: hallucinations  . Penicillins     unknown    Current Outpatient Prescriptions  Medication Sig Dispense Refill  . albuterol (PROVENTIL HFA;VENTOLIN HFA) 108 (90 BASE) MCG/ACT inhaler Inhale 2 puffs into the lungs every 6 (six) hours as needed for wheezing.      [name] aspirin 81 MG EC tablet Take 81 mg by mouth daily.        [name] atenolol (TENORMIN) 50 MG tablet Take 50 mg by mouth 2 (two) times daily.      . cholecalciferol (VITAMIN D) 1000 UNITS tablet Take 1,000 Units by mouth every morning.      . furosemide (LASIX) 40 MG tablet Take 1 tablet (40 mg total) by mouth daily.  Take an extra pill if weight is above 3 Lbs.  30 tablet  5  . gabapentin (NEURONTIN) 300 MG capsule Take 300 mg by mouth 3 (three) times daily.      . insulin detemir (LEVEMIR) 100 UNIT/ML injection Inject 60 Units into the skin at bedtime.       . insulin lispro (HUMALOG) 100 UNIT/ML injection Inject into the skin See admin instructions. To be administered as directed per sliding scale: 90-150= 10  Units 151-200=11 units 201-250=12 units 251-300= 13 units  To call MD if levels are between 251-300 for three consecutive days      . levofloxacin (LEVAQUIN) 750 MG tablet Take 1 tablet (750 mg total) by mouth every other day.  7 tablet  0  . linagliptin (TRADJENTA) 5 MG TABS tablet Take 5 mg by mouth daily.      [name] NIFEdipine (PROCARDIA XL/ADALAT-CC) 90 MG 24 hr tablet Take 90 mg by mouth every evening.       . potassium chloride (K-DUR) 10 MEQ tablet Take 20 mEq by mouth daily.      . predniSONE (DELTASONE) 20 MG tablet Take 3 tablets (60 mg total) by mouth daily with breakfast.      . rosuvastatin (CRESTOR) 20 MG tablet Take 1 tablet (20 mg total) by mouth every evening.  30 tablet  11  . sorbitol 70 % SOLN Take 30 mLs by mouth daily as needed.  1 Bottle  0  . valsartan-hydrochlorothiazide (DIOVAN-HCT) 320-25 MG per tablet Take 1 tablet by mouth every morning.  30 tablet  11   No current facility-administered medications for this visit.    Past Medical History  Diagnosis Date  . Esophageal stricture   . Hyperlipidemia   . Hypertension   . Bronchitis   . CAD (coronary artery disease)     s/p Stenting in 2005;  [place] 7/09: Vigorous LV function, 2-3+ MR, RI 40%, RI stent patent, proximal-mid RCA 40-50%;  Echo 7/09:  EF 55-65%, trivial MR;  Myoview 11/18/12: EF 66%, normal LV wall motion, mild to moderate anterior ischemia - reviewed by [facility] and felt to rep breast atten (low risk);  Echo 6/14: mild LVH, EF 55-65%, Gr 2 DD, PASP 44, trivial eff   . Arthritis   . Non-insulin dependent diabetes  mellitus   . Diverticulosis   . Diabetic neuropathy   . Vitamin D deficiency   . Degenerative joint disease     left shoulder  . Chronic neck pain   . Shortness of breath     at night  . Headache(784.0)   . Anemia     hx of  . Stroke     "mini- years ago"  . Chronic kidney disease     CREATININE IS UP--GOING TO KIDNEY MD     Past Surgical History  Procedure Laterality Date  . Bladder surgery      Tack  . Partial hysterectomy    . Shoulder surgery      Right  . Cervical spine surgery  2012    8 screws  . Abdominal hysterectomy    . Laminectomy  12/29/2011  . Cardiac stents    . Lumbar laminectomy/decompression microdiscectomy  12/29/2011    Procedure: LUMBAR LAMINECTOMY/DECOMPRESSION MICRODISCECTOMY;  Surgeon: Floyce Stakes, MD;  Location: [facility];  Service: Neurosurgery;  Laterality: N/A;  Lumbar Two through Lumbar Five Laminectomies/Cellsaver  . Back surgery      2007 & 2013  . Cystoscopy with injection N/A 06/03/2013    Procedure: MACROPLASTIQUE  INJECTION ;  Surgeon: Malka So, MD;  Location: [facility];  Service: Urology;  Laterality: N/A;  . Cardiac catheterization  2003, 2005, 2009    ROS:  As stated in the HPI and negative for all other systems.  PHYSICAL EXAM BP 128/71  Pulse 71  Ht 5\' 2"  (1.575 m)  Wt 221 lb (100.245 kg)  BMI 40.41 kg/m2 GENERAL:  Well appearing HEENT:  Pupils equal round and reactive, fundi not visualized, oral mucosa unremarkable NECK:  No jugular venous distention, waveform within normal limits, carotid upstroke brisk and symmetric, no bruits, no thyromegaly LYMPHATICS:  No cervical, inguinal adenopathy LUNGS:  Clear to auscultation bilaterally BACK:  No CVA tenderness CHEST:  Unremarkable HEART:  PMI not displaced or sustained,S1 and S2 within normal limits, no S3, no S4, no clicks, no rubs, no murmurs ABD:  Flat, positive bowel sounds normal in frequency in pitch, no bruits, no rebound, no guarding, no midline pulsatile mass, no  hepatomegaly, no splenomegaly EXT:  2 plus pulses throughout, mild bilateral lower extremity edema, no cyanosis no clubbing SKIN:  No rashes no nodules, keloids   ASSESSMENT AND PLAN   CAD -  The patient has no new sypmtoms.  No further cardiovascular testing is indicated.  We will continue with aggressive risk reduction and meds as listed.  HYPERTENSION, BENIGN -  The blood pressure is at target. No change  in medications is indicated. We will continue with therapeutic lifestyle changes (TLC).  DYSLIPIDEMIA - I reviewed her most recent NMR.  Her LDL calculated not at target. I would suggest going up on Crestor. She was referred to the lipid clinic at [facility] but would prefer to have this followed by her endocrinologist.  CHRONIC DIASTOLIC - I reviewed her weight diary. She sometimes fluctuates by 3 or 4 pounds in a day but is down the following day. We discussed the rationale when necessary dosing of her diuretics. For today I think she seems to be euvolemic.

## 2013-11-19 NOTE — Patient Instructions (Signed)
The current medical regimen is effective;  continue present plan and medications.  Follow up in 6 months with Dr Hochrein.  You will receive a letter in the mail 2 months before you are due.  Please call us when you receive this letter to schedule your follow up appointment.  

## 2013-11-24 ENCOUNTER — Ambulatory Visit (INDEPENDENT_AMBULATORY_CARE_PROVIDER_SITE_OTHER): Payer: Medicare Other | Admitting: Family Medicine

## 2013-11-24 ENCOUNTER — Encounter: Payer: Self-pay | Admitting: Family Medicine

## 2013-11-24 VITALS — BP 157/79 | HR 66 | Temp 98.0°F | Ht 62.0 in | Wt 211.0 lb

## 2013-11-24 DIAGNOSIS — M19012 Primary osteoarthritis, left shoulder: Secondary | ICD-10-CM

## 2013-11-24 DIAGNOSIS — E785 Hyperlipidemia, unspecified: Secondary | ICD-10-CM

## 2013-11-24 DIAGNOSIS — R0600 Dyspnea, unspecified: Secondary | ICD-10-CM

## 2013-11-24 DIAGNOSIS — N189 Chronic kidney disease, unspecified: Secondary | ICD-10-CM

## 2013-11-24 DIAGNOSIS — E559 Vitamin D deficiency, unspecified: Secondary | ICD-10-CM

## 2013-11-24 DIAGNOSIS — I10 Essential (primary) hypertension: Secondary | ICD-10-CM

## 2013-11-24 DIAGNOSIS — E119 Type 2 diabetes mellitus without complications: Secondary | ICD-10-CM

## 2013-11-24 DIAGNOSIS — I2510 Atherosclerotic heart disease of native coronary artery without angina pectoris: Secondary | ICD-10-CM

## 2013-11-24 DIAGNOSIS — R0609 Other forms of dyspnea: Secondary | ICD-10-CM

## 2013-11-24 DIAGNOSIS — I509 Heart failure, unspecified: Secondary | ICD-10-CM

## 2013-11-24 DIAGNOSIS — M19019 Primary osteoarthritis, unspecified shoulder: Secondary | ICD-10-CM

## 2013-11-24 DIAGNOSIS — I5031 Acute diastolic (congestive) heart failure: Secondary | ICD-10-CM

## 2013-11-24 MED ORDER — ALBUTEROL SULFATE HFA 108 (90 BASE) MCG/ACT IN AERS: 2.0000 | INHALATION_SPRAY | Freq: Four times a day (QID) | RESPIRATORY_TRACT | Status: DC | PRN

## 2013-11-24 NOTE — Patient Instructions (Signed)
Diabetes and Foot Care Diabetes may cause you to have problems because of poor blood supply (circulation) to your feet and legs. This may cause the skin on your feet to become thinner, break easier, and heal more slowly. Your skin may become dry, and the skin may peel and crack. You may also have nerve damage in your legs and feet causing decreased feeling in them. You may not notice minor injuries to your feet that could lead to infections or more serious problems. Taking care of your feet is one of the most important things you can do for yourself.  HOME CARE INSTRUCTIONS  Wear shoes at all times, even in the house. Do not go barefoot. Bare feet are easily injured.  Check your feet daily for blisters, cuts, and redness. If you cannot see the bottom of your feet, use a mirror or ask someone for help.  Wash your feet with warm water (do not use hot water) and mild soap. Then pat your feet and the areas between your toes until they are completely dry. Do not soak your feet as this can dry your skin.  Apply a moisturizing lotion or petroleum jelly (that does not contain alcohol and is unscented) to the skin on your feet and to dry, brittle toenails. Do not apply lotion between your toes.  Trim your toenails straight across. Do not dig under them or around the cuticle. File the edges of your nails with an emery board or nail file.  Do not cut corns or calluses or try to remove them with medicine.  Wear clean socks or stockings every day. Make sure they are not too tight. Do not wear knee-high stockings since they may decrease blood flow to your legs.  Wear shoes that fit properly and have enough cushioning. To break in new shoes, wear them for just a few hours a day. This prevents you from injuring your feet. Always look in your shoes before you put them on to be sure there are no objects inside.  Do not cross your legs. This may decrease the blood flow to your feet.  If you find a minor scrape,  cut, or break in the skin on your feet, keep it and the skin around it clean and dry. These areas may be cleansed with mild soap and water. Do not cleanse the area with peroxide, alcohol, or iodine.  When you remove an adhesive bandage, be sure not to damage the skin around it.  If you have a wound, look at it several times a day to make sure it is healing.  Do not use heating pads or hot water bottles. They may burn your skin. If you have lost feeling in your feet or legs, you may not know it is happening until it is too late.  Make sure your health care provider performs a complete foot exam at least annually or more often if you have foot problems. Report any cuts, sores, or bruises to your health care provider immediately. SEEK MEDICAL CARE IF:   You have an injury that is not healing.  You have cuts or breaks in the skin.  You have an ingrown nail.  You notice redness on your legs or feet.  You feel burning or tingling in your legs or feet.  You have pain or cramps in your legs and feet.  Your legs or feet are numb.  Your feet always feel cold. SEEK IMMEDIATE MEDICAL CARE IF:   There is increasing redness,   swelling, or pain in or around a wound.  There is a red line that goes up your leg.  Pus is coming from a wound.  You develop a fever or as directed by your health care provider.  You notice a bad smell coming from an ulcer or wound. Document Released: 11/17/2000 Document Revised: 07/23/2013 Document Reviewed: 04/29/2013 [facility] Patient Information 2014 [place].        Dr Paula Libra Recommendations  For nutrition information, I recommend books:  1).Eat to Live by Dr Excell Seltzer. 2).Prevent and Reverse Heart Disease by Dr Karl Luke. 3) Dr Janene Harvey Book:  Program to Reverse Diabetes  Exercise recommendations are:  If unable to walk, then the patient can exercise in a chair 3 times a day. By flapping arms like a bird gently and  raising legs outwards to the front.  If ambulatory, the patient can go for walks for 30 minutes 3 times a week. Then increase the intensity and duration as tolerated.  Goal is to try to attain exercise frequency to 5 times a week.  If applicable: Best to perform resistance exercises (machines or weights) 2 days a week and cardio type exercises 3 days per week.   Heart Failure Heart failure is a condition in which the heart has trouble pumping blood. This means your heart does not pump blood efficiently for your body to work well. In some cases of heart failure, fluid may back up into your lungs or you may have swelling (edema) in your lower legs. Heart failure is usually a long-term (chronic) condition. It is important for you to take good care of yourself and follow your caregiver's treatment plan. CAUSES  Some health conditions can cause heart failure. Those health conditions include:  High blood pressure (hypertension) causes the heart muscle to work harder than normal. When pressure in the blood vessels is high, the heart needs to pump (contract) with more force in order to circulate blood throughout the body. High blood pressure eventually causes the heart to become stiff and weak.  Coronary artery disease (CAD) is the buildup of cholesterol and fat (plaque) in the arteries of the heart. The blockage in the arteries deprives the heart muscle of oxygen and blood. This can cause chest pain and may lead to a heart attack. High blood pressure can also contribute to CAD.  Heart attack (myocardial infarction) occurs when 1 or more arteries in the heart become blocked. The loss of oxygen damages the muscle tissue of the heart. When this happens, part of the heart muscle dies. The injured tissue does not contract as well and weakens the heart's ability to pump blood.  Abnormal heart valves can cause heart failure when the heart valves do not open and close properly. This makes the heart muscle pump  harder to keep the blood flowing.  Heart muscle disease (cardiomyopathy or myocarditis) is damage to the heart muscle from a variety of causes. These can include drug or alcohol abuse, infections, or unknown reasons. These can increase the risk of heart failure.  Lung disease makes the heart work harder because the lungs do not work properly. This can cause a strain on the heart, leading it to fail.  Diabetes increases the risk of heart failure. High blood sugar contributes to high fat (lipid) levels in the blood. Diabetes can also cause slow damage to tiny blood vessels that carry important nutrients to the heart muscle. When the heart does not get enough oxygen and food, it  can cause the heart to become weak and stiff. This leads to a heart that does not contract efficiently.  Other conditions can contribute to heart failure. These include abnormal heart rhythms, thyroid problems, and low blood counts (anemia). Certain unhealthy behaviors can increase the risk of heart failure. Those unhealthy behaviors include:  Being overweight.  Smoking or chewing tobacco.  Eating foods high in fat and cholesterol.  Abusing illicit drugs or alcohol.  Lacking physical activity. SYMPTOMS  Heart failure symptoms may vary and can be hard to detect. Symptoms may include:  Shortness of breath with activity, such as climbing stairs.  Persistent cough.  Swelling of the feet, ankles, legs, or abdomen.  Unexplained weight gain.  Difficulty breathing when lying flat (orthopnea).  Waking from sleep because of the need to sit up and get more air.  Rapid heartbeat.  Fatigue and loss of energy.  Feeling lightheaded, dizzy, or close to fainting.  Loss of appetite.  Nausea.  Increased urination during the night (nocturia). DIAGNOSIS  A diagnosis of heart failure is based on your history, symptoms, physical examination, and diagnostic tests. Diagnostic tests for heart failure may  include:  Echocardiography.  Electrocardiography.  Chest X-ray.  Blood tests.  Exercise stress test.  Cardiac angiography.  Radionuclide scans. TREATMENT  Treatment is aimed at managing the symptoms of heart failure. Medicines, behavioral changes, or surgical intervention may be necessary to treat heart failure.  Medicines to help treat heart failure may include:  Angiotensin-converting enzyme (ACE) inhibitors. This type of medicine blocks the effects of a blood protein called angiotensin-converting enzyme. ACE inhibitors relax (dilate) the blood vessels and help lower blood pressure.  Angiotensin receptor blockers. This type of medicine blocks the actions of a blood protein called angiotensin. Angiotensin receptor blockers dilate the blood vessels and help lower blood pressure.  Water pills (diuretics). Diuretics cause the kidneys to remove salt and water from the blood. The extra fluid is removed through urination. This loss of extra fluid lowers the volume of blood the heart pumps.  Beta blockers. These prevent the heart from beating too fast and improve heart muscle strength.  Digitalis. This increases the force of the heartbeat.  Healthy behavior changes include:  Obtaining and maintaining a healthy weight.  Stopping smoking or chewing tobacco.  Eating heart healthy foods.  Limiting or avoiding alcohol.  Stopping illicit drug use.  Physical activity as directed by your caregiver.  Surgical treatment for heart failure may include:  A procedure to open blocked arteries, repair damaged heart valves, or remove damaged heart muscle tissue.  A pacemaker to improve heart muscle function and control certain abnormal heart rhythms.  An internal cardioverter defibrillator to treat certain serious abnormal heart rhythms.  A left ventricular assist device to assist the pumping ability of the heart. HOME CARE INSTRUCTIONS   Take your medicine as directed by your  caregiver. Medicines are important in reducing the workload of your heart, slowing the progression of heart failure, and improving your symptoms.  Do not stop taking your medicine unless directed by your caregiver.  Do not skip any dose of medicine.  Refill your prescriptions before you run out of medicine. Your medicines are needed every day.  Take over-the-counter medicine only as directed by your caregiver or pharmacist.  Engage in moderate physical activity if directed by your caregiver. Moderate physical activity can benefit some people. The elderly and people with severe heart failure should consult with a caregiver for physical activity recommendations.  Eat heart  healthy foods. Food choices should be free of trans fat and low in saturated fat, cholesterol, and salt (sodium). Healthy choices include fresh or frozen fruits and vegetables, fish, lean meats, legumes, fat-free or low-fat dairy products, and whole grain or high fiber foods. Talk to a dietitian to learn more about heart healthy foods.  Limit sodium if directed by your caregiver. Sodium restriction may reduce symptoms of heart failure in some people. Talk to a dietitian to learn more about heart healthy seasonings.  Use healthy cooking methods. Healthy cooking methods include roasting, grilling, broiling, baking, poaching, steaming, or stir-frying. Talk to a dietitian to learn more about healthy cooking methods.  Limit fluids if directed by your caregiver. Fluid restriction may reduce symptoms of heart failure in some people.  Weigh yourself every day. Daily weights are important in the early recognition of excess fluid. You should weigh yourself every morning after you urinate and before you eat breakfast. Wear the same amount of clothing each time you weigh yourself. Record your daily weight. Provide your caregiver with your weight record.  Monitor and record your blood pressure if directed by your caregiver.  Check your  pulse if directed by your caregiver.  Lose weight if directed by your caregiver. Weight loss may reduce symptoms of heart failure in some people.  Stop smoking or chewing tobacco. Nicotine makes your heart work harder by causing your blood vessels to constrict. Do not use nicotine gum or patches before talking to your caregiver.  Schedule and attend follow-up visits as directed by your caregiver. It is important to keep all your appointments.  Limit alcohol intake to no more than 1 drink per day for nonpregnant women and 2 drinks per day for men. Drinking more than that is harmful to your heart. Tell your caregiver if you drink alcohol several times a week. Talk with your caregiver about whether alcohol is safe for you. If your heart has already been damaged by alcohol or you have severe heart failure, drinking alcohol should be stopped completely.  Stop illicit drug use.  Stay up-to-date with immunizations. It is especially important to prevent respiratory infections through current pneumococcal and influenza immunizations.  Manage other health conditions such as hypertension, diabetes, thyroid disease, or abnormal heart rhythms as directed by your caregiver.  Learn to manage stress.  Plan rest periods when fatigued.  Learn strategies to manage high temperatures. If the weather is extremely hot:  Avoid vigorous physical activity.  Use air conditioning or fans or seek a cooler location.  Avoid caffeine and alcohol.  Wear loose-fitting, lightweight, and light-colored clothing.  Learn strategies to manage cold temperatures. If the weather is extremely cold:  Avoid vigorous physical activity.  Layer clothes.  Wear mittens or gloves, a hat, and a scarf when going outside.  Avoid alcohol.  Obtain ongoing education and support as needed.  Participate or seek rehabilitation as needed to maintain or improve independence and quality of life. SEEK MEDICAL CARE IF:   Your weight  increases by 03 lb/1.4 kg in 1 day or 05 lb/2.3 kg in a week.  You have increasing shortness of breath that is unusual for you.  You are unable to participate in your usual physical activities.  You tire easily.  You cough more than normal, especially with physical activity.  You have any or more swelling in areas such as your hands, feet, ankles, or abdomen.  You are unable to sleep because it is hard to breathe.  You feel like  your heart is beating fast (palpitations).  You become dizzy or lightheaded upon standing up. SEEK IMMEDIATE MEDICAL CARE IF:   You have difficulty breathing.  There is a change in mental status such as decreased alertness or difficulty with concentration.  You have a pain or discomfort in your chest.  You have an episode of fainting (syncope). MAKE SURE YOU:   Understand these instructions.  Will watch your condition.  Will get help right away if you are not doing well or get worse. Document Released: 11/20/2005 Document Revised: 03/17/2013 Document Reviewed: 12/12/2012 [facility] Patient Information 2014 [place], [place].   Diabetic Nephropathy Diabetic nephropathy is a complication of diabetes that leads to damaged kidneys. It develops slowly. The function of healthy kidneys is to filter and clean blood. Kidneys also get rid of body waste products and extra fluid. When the kidney filters are damaged, there is protein loss in the urine, a decline in kidney function, a buildup of kidney waste products and fluid, and high blood pressure. The damage progresses until the kidneys fail.  RISK FACTORS  High blood pressure (hypertension).  High blood sugar (hyperglycemia).  Family history.  Aging.  Obstruction problems affecting the kidneys, the tubes that drain the kidneys (ureters), or the bladder.  Taking certain drugs or medicines. SYMPTOMS  Symptoms may not be seen or felt for many years. You may not notice any signs of kidney failure until  your kidneys have lost much of their ability to function. An early sign of damage is when small amounts of protein (albumin) leak into the urine. However, this can only be found through a urine test. Without physical symptoms, a urine test is often not performed. When the kidneys fail, you may feel one or more of the following:  Swelling of the hands and feet from the extra fluid in your body.  Constant upset stomach.  Constant fatigue. DIAGNOSIS When someone has diabetes, screening tests are done to look for any early signs of problems before symptoms develop and before damage has already been done. These tests may include:   Annual urine tests to screen for trace amounts of protein in the urine (microalbuminuria).  Urine collectionover 24 hours to measure kidney function.  Blood tests that measure kidney function. Your caregiver is aware that problems other than diabetes can damage kidneys. If screening tests show early kidney damage, but it is thought that a different problem is causing the damage, other tests may be performed. Examples of these tests include:  An ultrasound of your kidney.  Taking a tissue sample (biopsy) from the kidney. TREATMENT The goal of treatment is to prevent or slow down damage to your kidneys. Controlling hypertension and hyperglycemia is critical. Your goal is to maintain a blood pressure below 120/80. If you have certain other medical problems, this goal may be different. Talk to your caregiver to make sure that your blood pressure goal is right for your needs. Regular testing of your blood glucose at home is important. Your goal is to have a normal blood glucose (110 or less when fasting) as often as possible.  In addition, maintaining your hemoglobin A1c level at less than 7% reduces your risk for complications, including kidney damage. Common treatments include:  Dieting by controlling what you eat as well as the portion sizes.  Exercising to control  blood pressure and blood glucose.  Taking medicines.  Giving yourself insulin injections if your caregiver feels that it is necessary.  Getting early treatment for urinary tract  infections.  Regularly following up with your caregiver. If your disease progresses to end-stage kidney failure, you will need dialysis or a transplant. Dialysis can be done in 1 of 2 ways:  Hemodialysis. Your blood flows from a tube in your arm through a machine. The machine filters waste and extra fluid. The clean blood flows back into your arm.  Peritoneal dialysis. Your abdomen is filled with a special fluid. The fluid collects waste products and extra fluid from your blood. The fluid is then drained from your abdomen and discarded. SEEK MEDICAL CARE IF:   You are having problems keeping your blood glucose in the goal range.  You have swelling of the hands or feet.  You have weakness.  You have muscles spasms.  You have a constant upset stomach.  You feel tired all the time and this is not normal for you. SEEK IMMEDIATE MEDICAL CARE IF:  You have unusual dizziness or weakness.  You have excessive sleepiness.  You have a seizure or convulsion.  You have severe, painful muscle spasms.  You have shortness of breath or trouble breathing.  You pass out or have a fainting episode.  You have chest pains. MAKE SURE YOU:  Understand these instructions.  Will watch your condition.  Will get help right away if you are not doing well or get worse. Document Released: 12/10/2007 Document Revised: 07/23/2013 Document Reviewed: 07/12/2011 [facility] Patient Information 2014 [place], [place].

## 2013-11-24 NOTE — Progress Notes (Signed)
Patient ID: [name], female   DOB: Sep 07, 1951, 62 y.o.   MRN: [id number] SUBJECTIVE: CC: Chief Complaint  Patient presents with  . Follow-up    6 week f/u on CHF    HPI:  Patient is here for follow up of Diabetes Mellitus/heart failure/Vit D/CKD/HLD/HTN Symptoms evaluated: Denies Nocturia ,Denies Urinary Frequency , denies Blurred vision ,deniesDizziness,denies.Dysuria,denies paresthesias, denies extremity pain or ulcers.Marland Kitchendenies chest pain. has had an annual eye exam. do check the feet. Does check CBGs. Average CBG:200s Denies episodes of hypoglycemia. Does have an emergency hypoglycemic plan. admits toCompliance with medications. Denies Problems with medications.  Has appointment with nephrology, cardiology.  Past Medical History  Diagnosis Date  . Esophageal stricture   . Hyperlipidemia   . Hypertension   . Bronchitis   . CAD (coronary artery disease)     s/p Stenting in 2005;  [place] 7/09: Vigorous LV function, 2-3+ MR, RI 40%, RI stent patent, proximal-mid RCA 40-50%;  [place] 7/09:  EF 55-65%, trivial MR;  Myoview 11/18/12: EF 66%, normal LV wall motion, mild to moderate anterior ischemia - reviewed by [facility] and felt to rep breast atten (low risk);  Echo 6/14: mild LVH, EF 55-65%, Gr 2 DD, PASP 44, trivial eff   . Arthritis   . Non-insulin dependent diabetes mellitus   . Diverticulosis   . Diabetic neuropathy   . Vitamin D deficiency   . Degenerative joint disease     left shoulder  . Chronic neck pain   . Shortness of breath     at night  . Headache(784.0)   . Anemia     hx of  . Stroke     "mini- years ago"  . Chronic kidney disease     CREATININE IS UP--GOING TO KIDNEY MD    Past Surgical History  Procedure Laterality Date  . Bladder surgery      Tack  . Partial hysterectomy    . Shoulder surgery      Right  . Cervical spine surgery  2012    8 screws  . Abdominal hysterectomy    . Laminectomy  12/29/2011  . Cardiac stents    . Lumbar  laminectomy/decompression microdiscectomy  12/29/2011    Procedure: LUMBAR LAMINECTOMY/DECOMPRESSION MICRODISCECTOMY;  Surgeon: Floyce Stakes, MD;  Location: [place] [facility];  Service: Neurosurgery;  Laterality: N/A;  Lumbar Two through Lumbar Five Laminectomies/Cellsaver  . Back surgery      2007 & 2013  . Cystoscopy with injection N/A 06/03/2013    Procedure: MACROPLASTIQUE  INJECTION ;  Surgeon: Malka So, MD;  Location: [facility];  Service: Urology;  Laterality: N/A;  . Cardiac catheterization  2003, 2005, 2009   History   Social History  . Marital Status: Widowed    Spouse Name: N/A    Number of Children: N/A  . Years of Education: N/A   Occupational History  . Not on file.   Social History Main Topics  . Smoking status: Former Smoker -- 1.00 packs/day for 30 years    Quit date: 12/04/2001  . Smokeless tobacco: Never Used  . Alcohol Use: Yes     Comment: occasional  . Drug Use: No  . Sexual Activity: No   Other Topics Concern  . Not on file   Social History Narrative   Lives in [place]   Family History  Problem Relation Age of Onset  . Heart disease Other    Current Outpatient Prescriptions on File Prior to Visit  Medication Sig  Dispense Refill  . aspirin 81 MG EC tablet Take 81 mg by mouth daily.        [name] atenolol (TENORMIN) 50 MG tablet Take 50 mg by mouth 2 (two) times daily.      . cholecalciferol (VITAMIN D) 1000 UNITS tablet Take 1,000 Units by mouth every morning.      . furosemide (LASIX) 40 MG tablet Take 1 tablet (40 mg total) by mouth daily. Take an extra pill if weight is above 3 Lbs.  30 tablet  5  . gabapentin (NEURONTIN) 300 MG capsule Take 300 mg by mouth 3 (three) times daily.      . insulin detemir (LEVEMIR) 100 UNIT/ML injection Inject 60 Units into the skin at bedtime.       . insulin lispro (HUMALOG) 100 UNIT/ML injection Inject into the skin See admin instructions. To be administered as directed per sliding scale: 90-150= 10  Units 151-200=11  units 201-250=12 units 251-300= 13 units  To call MD if levels are between 251-300 for three consecutive days      . linagliptin (TRADJENTA) 5 MG TABS tablet Take 5 mg by mouth daily.      [name] NIFEdipine (PROCARDIA XL/ADALAT-CC) 90 MG 24 hr tablet Take 90 mg by mouth every evening.       . rosuvastatin (CRESTOR) 20 MG tablet Take 1 tablet (20 mg total) by mouth every evening.  30 tablet  11  . valsartan-hydrochlorothiazide (DIOVAN-HCT) 320-25 MG per tablet Take 1 tablet by mouth every morning.  30 tablet  11   No current facility-administered medications on file prior to visit.   Allergies  Allergen Reactions  . Ace Inhibitors     unknown  . Clopidogrel Bisulfate     REACTION: Sick, Headache, "Felt terrible"  . Codeine Other (See Comments)    REACTION: hallucinations  . Penicillins     unknown   Immunization History  Administered Date(s) Administered  . DTaP 07/19/2010  . Influenza Whole 09/03/2006  . Influenza,inj,Quad PF,36+ Mos 09/11/2013   Prior to Admission medications   Medication Sig Start Date End Date Taking? Authorizing Provider  albuterol (PROVENTIL HFA;VENTOLIN HFA) 108 (90 BASE) MCG/ACT inhaler Inhale 2 puffs into the lungs every 6 (six) hours as needed for wheezing.    Historical Provider, MD  aspirin 81 MG EC tablet Take 81 mg by mouth daily.      Historical Provider, MD  atenolol (TENORMIN) 50 MG tablet Take 50 mg by mouth 2 (two) times daily.    Historical Provider, MD  cholecalciferol (VITAMIN D) 1000 UNITS tablet Take 1,000 Units by mouth every morning.    Historical Provider, MD  furosemide (LASIX) 40 MG tablet Take 1 tablet (40 mg total) by mouth daily. Take an extra pill if weight is above 3 Lbs. 10/09/13   Vernie Shanks, MD  gabapentin (NEURONTIN) 300 MG capsule Take 300 mg by mouth 3 (three) times daily.    Historical Provider, MD  insulin detemir (LEVEMIR) 100 UNIT/ML injection Inject 60 Units into the skin at bedtime.     Historical Provider, MD  insulin  lispro (HUMALOG) 100 UNIT/ML injection Inject into the skin See admin instructions. To be administered as directed per sliding scale: 90-150= 10  Units 151-200=11 units 201-250=12 units 251-300= 13 units  To call MD if levels are between 251-300 for three consecutive days    Historical Provider, MD  levofloxacin (LEVAQUIN) 750 MG tablet Take 1 tablet (750 mg total) by mouth every other day.  10/02/13   Donne Hazel, MD  linagliptin (TRADJENTA) 5 MG TABS tablet Take 5 mg by mouth daily.    Historical Provider, MD  NIFEdipine (PROCARDIA XL/ADALAT-CC) 90 MG 24 hr tablet Take 90 mg by mouth every evening.     Historical Provider, MD  potassium chloride (K-DUR) 10 MEQ tablet Take 20 mEq by mouth daily.    Historical Provider, MD  predniSONE (DELTASONE) 20 MG tablet Take 3 tablets (60 mg total) by mouth daily with breakfast. 10/02/13   Donne Hazel, MD  rosuvastatin (CRESTOR) 20 MG tablet Take 1 tablet (20 mg total) by mouth every evening. 07/30/13   Vernie Shanks, MD  sorbitol 70 % SOLN Take 30 mLs by mouth daily as needed. 10/02/13   Donne Hazel, MD  valsartan-hydrochlorothiazide (DIOVAN-HCT) 320-25 MG per tablet Take 1 tablet by mouth every morning. 07/30/13   Vernie Shanks, MD     ROS: As above in the HPI. All other systems are stable or negative.  OBJECTIVE: APPEARANCE:  Patient in no acute distress.The patient appeared well nourished and normally developed. Acyanotic. Waist: VITAL SIGNS:BP 157/79  Pulse 66  Temp(Src) 98 F (36.7 C) (Oral)  Ht 5\' 2"  (1.575 m)  Wt 211 lb (95.709 kg)  BMI 38.58 kg/m2  Obese AAF  SKIN: warm and  Dry without overt rashes, tattoos. Keloid scars unchanged.  HEAD and Neck: without JVD, Head and scalp: normal Eyes:No scleral icterus. Fundi normal, eye movements normal. Ears: Auricle normal, canal normal, Tympanic membranes normal, insufflation normal. Nose: normal Throat: normal Neck & thyroid: normal  CHEST & LUNGS: Chest wall:  normal Lungs: Clear  CVS: Reveals the PMI to be normally located. Regular rhythm, First and Second Heart sounds are normal,  absence of murmurs, rubs or gallops. Peripheral vasculature: Radial pulses: normal Dorsal pedis pulses: normal Posterior pulses: normal  ABDOMEN:  Appearance: normal Benign, no organomegaly, no masses, no Abdominal Aortic enlargement. No Guarding , no rebound. No Bruits. Bowel sounds: normal  RECTAL: N/A GU: N/A  EXTREMETIES: trace edematous. Left shoulder no changes  NEUROLOGIC: oriented to time,place and person; nonfocal.  Results for orders placed in visit on 10/09/13  CMP14+EGFR      Result Value Range   Glucose 209 (*) 65 - 99 mg/dL   BUN 46 (*) 8 - 27 mg/dL   Creatinine, Ser 2.28 (*) 0.57 - 1.00 mg/dL   GFR calc non Af Amer 23 (*) >59 mL/min/1.73   GFR calc Af Amer 26 (*) >59 mL/min/1.73   BUN/Creatinine Ratio 20  11 - 26   Sodium 141  134 - 144 mmol/L   Potassium 4.2  3.5 - 5.2 mmol/L   Chloride 99  97 - 108 mmol/L   CO2 27  18 - 29 mmol/L   Calcium 10.1  8.6 - 10.2 mg/dL   Total Protein 6.0  6.0 - 8.5 g/dL   Albumin 3.7  3.6 - 4.8 g/dL   Globulin, Total 2.3  1.5 - 4.5 g/dL   Albumin/Globulin Ratio 1.6  1.1 - 2.5   Total Bilirubin <0.2  0.0 - 1.2 mg/dL   Alkaline Phosphatase 88  39 - 117 IU/L   AST 18  0 - 40 IU/L   ALT 34 (*) 0 - 32 IU/L  NMR, LIPOPROFILE      Result Value Range   LDL Particle Number 1955 (*) <1000 nmol/L   LDLC SERPL CALC-MCNC 125 (*) <100 mg/dL   HDL Cholesterol by NMR 65  >=40  mg/dL   Triglycerides by NMR 147  <150 mg/dL   Cholesterol 219 (*) <200 mg/dL   HDL Particle Number 47.6  >=30.5 umol/L   Small LDL Particle Number 578 (*) <=527 nmol/L   LDL Size 21.1  >20.5 nm   LP-IR Score 44  <=45  TSH      Result Value Range   TSH 0.904  0.450 - 4.500 uIU/mL  VITAMIN D 25 HYDROXY      Result Value Range   Vit D, 25-Hydroxy 26.1 (*) 30.0 - 100.0 ng/mL  POCT GLYCOSYLATED HEMOGLOBIN (HGB A1C)      Result Value  Range   Hemoglobin A1C 8.2%      ASSESSMENT:  Vitamin D deficiency  Degenerative joint disease of left shoulder  HYPERTENSION, BENIGN  HYPERLIPIDEMIA  DM  CKD (chronic kidney disease) - Plan: BMP8+EGFR  CAD  Acute diastolic CHF (congestive heart failure)  Dyspnea - Plan: albuterol (PROVENTIL HFA;VENTOLIN HFA) 108 (90 BASE) MCG/ACT inhaler  PLAN: Reviewed the records in EPIC. Patient to keep follow up with nephrology and endocrinology- DR Dorris Fetch.   Orders Placed This Encounter  Procedures  . BMP8+EGFR   Meds ordered this encounter  Medications  . albuterol (PROVENTIL HFA;VENTOLIN HFA) 108 (90 BASE) MCG/ACT inhaler    Sig: Inhale 2 puffs into the lungs every 6 (six) hours as needed for wheezing.    Dispense:  18 g    Refill:  2   Medications Discontinued During This Encounter  Medication Reason  . levofloxacin (LEVAQUIN) 750 MG tablet Completed Course  . potassium chloride (K-DUR) 10 MEQ tablet Patient has not taken in last 30 days  . predniSONE (DELTASONE) 20 MG tablet Completed Course  . sorbitol 70 % SOLN Patient Preference  . albuterol (PROVENTIL HFA;VENTOLIN HFA) 108 (90 BASE) MCG/ACT inhaler Reorder   Return in about 2 months (around 01/25/2014) for Recheck medical problems.  Josefine Fuhr P. Jacelyn Grip, M.D.

## 2013-11-25 LAB — BMP8+EGFR
BUN/Creatinine Ratio: 13 (ref 11–26)
BUN: 27 mg/dL (ref 8–27)
CO2: 28 mmol/L (ref 18–29)
Calcium: 9.6 mg/dL (ref 8.6–10.2)
Chloride: 98 mmol/L (ref 97–108)
Creatinine, Ser: 2.06 mg/dL — ABNORMAL HIGH (ref 0.57–1.00)
GFR calc Af Amer: 29 mL/min/{1.73_m2} — ABNORMAL LOW (ref >59)
GFR calc non Af Amer: 25 mL/min/{1.73_m2} — ABNORMAL LOW (ref >59)
Glucose: 122 mg/dL — ABNORMAL HIGH (ref 65–99)
Potassium: 3.1 mmol/L — ABNORMAL LOW (ref 3.5–5.2)
Sodium: 143 mmol/L (ref 134–144)

## 2013-12-01 ENCOUNTER — Other Ambulatory Visit: Payer: Self-pay | Admitting: Family Medicine

## 2013-12-01 DIAGNOSIS — N184 Chronic kidney disease, stage 4 (severe): Secondary | ICD-10-CM

## 2013-12-01 DIAGNOSIS — E876 Hypokalemia: Secondary | ICD-10-CM

## 2013-12-01 MED ORDER — POTASSIUM CHLORIDE CRYS ER 10 MEQ PO TBCR: 10.0000 meq | EXTENDED_RELEASE_TABLET | Freq: Once | ORAL | Status: DC

## 2013-12-01 NOTE — Progress Notes (Signed)
Quick Note:  Call Patient Labs that are abnormal: Potassium low Chronic kidney disease/failure stable.   The rest are at goal Is she seeing a kidney specialist ??not Dr Jeffie Pollock who is urologist.  Recommendations: Will need to see Nephrology for further evaluation. Will need to take Kdur 10 meq daily for 2 days to raise the potassium and recheck the BMP next week. Ordered in EPic.    ______

## 2014-01-16 ENCOUNTER — Encounter: Payer: Self-pay | Admitting: *Deleted

## 2014-01-21 ENCOUNTER — Emergency Department (HOSPITAL_COMMUNITY): Payer: Medicare Other

## 2014-01-21 ENCOUNTER — Emergency Department (HOSPITAL_COMMUNITY)
Admission: EM | Admit: 2014-01-21 | Discharge: 2014-01-21 | Disposition: A | Discharge status: Home / Self Care | Payer: Medicare Other | Attending: Emergency Medicine | Admitting: Emergency Medicine

## 2014-01-21 ENCOUNTER — Ambulatory Visit (INDEPENDENT_AMBULATORY_CARE_PROVIDER_SITE_OTHER): Payer: Medicare Other | Admitting: Family Medicine

## 2014-01-21 ENCOUNTER — Encounter (HOSPITAL_COMMUNITY): Payer: Self-pay | Admitting: Emergency Medicine

## 2014-01-21 VITALS — BP 135/75 | HR 77 | Temp 97.2°F

## 2014-01-21 DIAGNOSIS — I509 Heart failure, unspecified: Secondary | ICD-10-CM | POA: Insufficient documentation

## 2014-01-21 DIAGNOSIS — Z8673 Personal history of transient ischemic attack (TIA), and cerebral infarction without residual deficits: Secondary | ICD-10-CM | POA: Insufficient documentation

## 2014-01-21 DIAGNOSIS — Z794 Long term (current) use of insulin: Secondary | ICD-10-CM | POA: Insufficient documentation

## 2014-01-21 DIAGNOSIS — R0603 Acute respiratory distress: Secondary | ICD-10-CM

## 2014-01-21 DIAGNOSIS — I129 Hypertensive chronic kidney disease with stage 1 through stage 4 chronic kidney disease, or unspecified chronic kidney disease: Secondary | ICD-10-CM | POA: Insufficient documentation

## 2014-01-21 DIAGNOSIS — E785 Hyperlipidemia, unspecified: Secondary | ICD-10-CM | POA: Insufficient documentation

## 2014-01-21 DIAGNOSIS — R0600 Dyspnea, unspecified: Secondary | ICD-10-CM

## 2014-01-21 DIAGNOSIS — M129 Arthropathy, unspecified: Secondary | ICD-10-CM | POA: Insufficient documentation

## 2014-01-21 DIAGNOSIS — J9801 Acute bronchospasm: Secondary | ICD-10-CM

## 2014-01-21 DIAGNOSIS — I2510 Atherosclerotic heart disease of native coronary artery without angina pectoris: Secondary | ICD-10-CM | POA: Insufficient documentation

## 2014-01-21 DIAGNOSIS — E1142 Type 2 diabetes mellitus with diabetic polyneuropathy: Secondary | ICD-10-CM | POA: Insufficient documentation

## 2014-01-21 DIAGNOSIS — Z8719 Personal history of other diseases of the digestive system: Secondary | ICD-10-CM | POA: Insufficient documentation

## 2014-01-21 DIAGNOSIS — Z79899 Other long term (current) drug therapy: Secondary | ICD-10-CM | POA: Insufficient documentation

## 2014-01-21 DIAGNOSIS — R0609 Other forms of dyspnea: Secondary | ICD-10-CM

## 2014-01-21 DIAGNOSIS — E559 Vitamin D deficiency, unspecified: Secondary | ICD-10-CM | POA: Insufficient documentation

## 2014-01-21 DIAGNOSIS — Z8739 Personal history of other diseases of the musculoskeletal system and connective tissue: Secondary | ICD-10-CM | POA: Insufficient documentation

## 2014-01-21 DIAGNOSIS — Z9889 Other specified postprocedural states: Secondary | ICD-10-CM | POA: Insufficient documentation

## 2014-01-21 DIAGNOSIS — N189 Chronic kidney disease, unspecified: Secondary | ICD-10-CM | POA: Insufficient documentation

## 2014-01-21 DIAGNOSIS — R0989 Other specified symptoms and signs involving the circulatory and respiratory systems: Secondary | ICD-10-CM

## 2014-01-21 DIAGNOSIS — J40 Bronchitis, not specified as acute or chronic: Secondary | ICD-10-CM

## 2014-01-21 DIAGNOSIS — Z87891 Personal history of nicotine dependence: Secondary | ICD-10-CM | POA: Insufficient documentation

## 2014-01-21 DIAGNOSIS — Z7982 Long term (current) use of aspirin: Secondary | ICD-10-CM | POA: Insufficient documentation

## 2014-01-21 DIAGNOSIS — J209 Acute bronchitis, unspecified: Secondary | ICD-10-CM | POA: Insufficient documentation

## 2014-01-21 DIAGNOSIS — Z880 Allergy status to penicillin: Secondary | ICD-10-CM | POA: Insufficient documentation

## 2014-01-21 DIAGNOSIS — E1149 Type 2 diabetes mellitus with other diabetic neurological complication: Secondary | ICD-10-CM | POA: Insufficient documentation

## 2014-01-21 DIAGNOSIS — G8929 Other chronic pain: Secondary | ICD-10-CM | POA: Insufficient documentation

## 2014-01-21 LAB — BASIC METABOLIC PANEL
BUN: 28 mg/dL — ABNORMAL HIGH (ref 6–23)
CO2: 30 mEq/L (ref 19–32)
Calcium: 8.8 mg/dL (ref 8.4–10.5)
Chloride: 94 mEq/L — ABNORMAL LOW (ref 96–112)
Creatinine, Ser: 2.44 mg/dL — ABNORMAL HIGH (ref 0.50–1.10)
GFR calc Af Amer: 23 mL/min — ABNORMAL LOW (ref >90)
GFR calc non Af Amer: 20 mL/min — ABNORMAL LOW (ref >90)
Glucose, Bld: 171 mg/dL — ABNORMAL HIGH (ref 70–99)
Potassium: 2.9 mEq/L — CL (ref 3.7–5.3)
Sodium: 139 mEq/L (ref 137–147)

## 2014-01-21 LAB — CBC
HCT: 38.3 % (ref 36.0–46.0)
Hemoglobin: 12.7 g/dL (ref 12.0–15.0)
MCH: 26.6 pg (ref 26.0–34.0)
MCHC: 33.2 g/dL (ref 30.0–36.0)
MCV: 80.1 fL (ref 78.0–100.0)
Platelets: 291 10*3/uL (ref 150–400)
RBC: 4.78 MIL/uL (ref 3.87–5.11)
RDW: 16.4 % — ABNORMAL HIGH (ref 11.5–15.5)
WBC: 14 10*3/uL — ABNORMAL HIGH (ref 4.0–10.5)

## 2014-01-21 LAB — TROPONIN I: Troponin I: <0.3 ng/mL (ref <0.30)

## 2014-01-21 LAB — PRO B NATRIURETIC PEPTIDE: Pro B Natriuretic peptide (BNP): 570.1 pg/mL — ABNORMAL HIGH (ref 0–125)

## 2014-01-21 MED ORDER — IPRATROPIUM-ALBUTEROL 0.5-2.5 (3) MG/3ML IN SOLN
3.0000 mL | RESPIRATORY_TRACT | Status: DC
  Administered 2014-01-21: 3 mL via RESPIRATORY_TRACT
  Filled 2014-01-21: qty 3

## 2014-01-21 MED ORDER — IPRATROPIUM-ALBUTEROL 0.5-2.5 (3) MG/3ML IN SOLN
3.0000 mL | Freq: Once | RESPIRATORY_TRACT | Status: AC
  Administered 2014-01-21: 3 mL via RESPIRATORY_TRACT
  Filled 2014-01-21: qty 3

## 2014-01-21 MED ORDER — ALBUTEROL SULFATE (2.5 MG/3ML) 0.083% IN NEBU
2.5000 mg | INHALATION_SOLUTION | Freq: Once | RESPIRATORY_TRACT | Status: AC
  Administered 2014-01-21: 2.5 mg via RESPIRATORY_TRACT
  Filled 2014-01-21: qty 3

## 2014-01-21 MED ORDER — LEVOFLOXACIN 500 MG PO TABS
500.0000 mg | ORAL_TABLET | Freq: Every day | ORAL | Status: DC
Start: 2014-01-21 — End: 2014-01-28

## 2014-01-21 MED ORDER — ALBUTEROL SULFATE (2.5 MG/3ML) 0.083% IN NEBU: 5.0000 mg | INHALATION_SOLUTION | Freq: Once | RESPIRATORY_TRACT | Status: DC

## 2014-01-21 MED ORDER — IPRATROPIUM-ALBUTEROL 0.5-2.5 (3) MG/3ML IN SOLN: 3.0000 mL | Freq: Once | RESPIRATORY_TRACT | Status: DC

## 2014-01-21 MED ORDER — POTASSIUM CHLORIDE CRYS ER 20 MEQ PO TBCR
40.0000 meq | EXTENDED_RELEASE_TABLET | Freq: Once | ORAL | Status: AC
  Administered 2014-01-21: 40 meq via ORAL
  Filled 2014-01-21: qty 2

## 2014-01-21 MED ORDER — FUROSEMIDE 10 MG/ML IJ SOLN
40.0000 mg | Freq: Once | INTRAMUSCULAR | Status: AC
  Administered 2014-01-21: 40 mg via INTRAVENOUS
  Filled 2014-01-21: qty 4

## 2014-01-21 MED ORDER — LEVOFLOXACIN 500 MG PO TABS
500.0000 mg | ORAL_TABLET | Freq: Once | ORAL | Status: AC
  Administered 2014-01-21: 500 mg via ORAL
  Filled 2014-01-21: qty 1

## 2014-01-21 MED ORDER — PREDNISONE 10 MG PO TABS: 20.0000 mg | ORAL_TABLET | Freq: Every day | ORAL | Status: DC

## 2014-01-21 MED ORDER — METHYLPREDNISOLONE SODIUM SUCC 125 MG IJ SOLR: 125.0000 mg | Freq: Once | INTRAMUSCULAR | Status: DC

## 2014-01-21 MED ORDER — IPRATROPIUM BROMIDE 0.02 % IN SOLN: 0.5000 mg | Freq: Once | RESPIRATORY_TRACT | Status: DC

## 2014-01-21 NOTE — Progress Notes (Signed)
Subjective:    Patient ID: [name], female    DOB: 10-19-1951, 63 y.o.   MRN: [id number]  HPI Patient presents today with chief complaint of respiratory distress x2-3 days. Has had subjective fevers and chills at home. No muscle aches. positive flu shot this year. Does have a baseline history of CHF. Has been eating salty foods and has noticed worsening lower extremity edema. Baseline orthopnea and PND. Has had progressively worsening SOB as well as cough. This has seemed to worsen. Also reports a remote history of pneumonia. Satting < 91% on RA in clinic.   Patient Active Problem List   Diagnosis Date Noted  . Hypokalemia 09/30/2013  . Dyspnea 09/29/2013  . Abnormal chest x-ray 09/29/2013  . Acute diastolic CHF (congestive heart failure) 09/29/2013  . Community acquired pneumonia 09/29/2013  . Type 2 diabetes mellitus with hypoglycemia 01/02/2012  . CKD (chronic kidney disease) 01/02/2012  . Diverticulosis 03/29/2011  . Diabetic neuropathy 03/29/2011  . Esophageal stricture 03/29/2011  . Vitamin D deficiency 03/29/2011  . Degenerative joint disease of left shoulder 03/29/2011  . Neck pain 03/29/2011  . DM 04/19/2009  . HYPERLIPIDEMIA 04/19/2009  . HYPERTENSION, BENIGN 04/19/2009  . CAD 04/19/2009  . BRONCHITIS 04/19/2009   Current Outpatient Prescriptions on File Prior to Visit  Medication Sig Dispense Refill  . albuterol (PROVENTIL HFA;VENTOLIN HFA) 108 (90 BASE) MCG/ACT inhaler Inhale 2 puffs into the lungs every 6 (six) hours as needed for wheezing.  18 g  2  . aspirin 81 MG EC tablet Take 81 mg by mouth daily.        [name] atenolol (TENORMIN) 50 MG tablet Take 50 mg by mouth 2 (two) times daily.      . cholecalciferol (VITAMIN D) 1000 UNITS tablet Take 1,000 Units by mouth every morning.      . furosemide (LASIX) 40 MG tablet Take 1 tablet (40 mg total) by mouth daily. Take an extra pill if weight is above 3 Lbs.  30 tablet  5  . gabapentin (NEURONTIN) 300 MG capsule  Take 300 mg by mouth 3 (three) times daily.      . insulin detemir (LEVEMIR) 100 UNIT/ML injection Inject 60 Units into the skin at bedtime.       . insulin lispro (HUMALOG) 100 UNIT/ML injection Inject into the skin See admin instructions. To be administered as directed per sliding scale: 90-150= 10  Units 151-200=11 units 201-250=12 units 251-300= 13 units  To call MD if levels are between 251-300 for three consecutive days      . linagliptin (TRADJENTA) 5 MG TABS tablet Take 5 mg by mouth daily.      [name] NIFEdipine (PROCARDIA XL/ADALAT-CC) 90 MG 24 hr tablet Take 90 mg by mouth every evening.       . potassium chloride (K-DUR,KLOR-CON) 10 MEQ tablet Take 1 tablet (10 mEq total) by mouth once.  2 tablet  0  . rosuvastatin (CRESTOR) 20 MG tablet Take 1 tablet (20 mg total) by mouth every evening.  30 tablet  11  . valsartan-hydrochlorothiazide (DIOVAN-HCT) 320-25 MG per tablet Take 1 tablet by mouth every morning.  30 tablet  11   No current facility-administered medications on file prior to visit.     Review of Systems  All other systems reviewed and are negative.       Objective:   Physical Exam  Constitutional:  Morbidly obese, increased work of breathing  HENT:  Head: Normocephalic and atraumatic.  Eyes: Conjunctivae  are normal. Pupils are equal, round, and reactive to light.  Neck: Normal range of motion.  Cardiovascular: Normal rate and regular rhythm.   Pulmonary/Chest: She is in respiratory distress. She has wheezes. She has rales.  Abdominal: Soft.  Musculoskeletal: She exhibits edema.  Neurological: She is alert.    Filed Vitals:   01/21/14 1036  BP: 135/75  Pulse: 77  Temp: 97.2 F (36.2 C)         Assessment & Plan:  Dyspnea  Respiratory distress   Supplemental oxygen placed at bedside. With O2 sats maintained between 93-95% with 2 L. Differential diagnosis includes flu versus pneumonia versus CHF exacerbation. Positive wheezing on exam. EMS  emergently called.plan for transfer to the ER for further evaluation.

## 2014-01-21 NOTE — ED Provider Notes (Signed)
CSN: [id number]     Arrival date & time 01/21/14  1140 History  This chart was scribed for Maudry Diego, MD by Ludger Nutting, ED Scribe. This patient was seen in room APA01/APA01 and the patient's care was started 12:06 PM.    Chief Complaint  Patient presents with  . Shortness of Breath      Patient is a 63 y.o. female presenting with shortness of breath. The history is provided by the patient. No language interpreter was used.  Shortness of Breath Severity:  Moderate Onset quality:  Gradual Duration:  3 days Timing:  Constant Relieved by:  Nothing Worsened by:  Deep breathing Associated symptoms: cough and fever     HPI Comments: [name] is a 63 y.o. female with past medical history of CHF, CAD, HTN, HLD who presents to the Emergency Department complaining of 3 days of gradual onset, gradually worsening, constant SOB with associated fever and chills. She reports going to [place] today who called EMS and had her transported here. Pt reports a history of pneumonia.    Past Medical History  Diagnosis Date  . Esophageal stricture   . Hyperlipidemia   . Hypertension   . Bronchitis   . CAD (coronary artery disease)     s/p Stenting in 2005;  [place] 7/09: Vigorous LV function, 2-3+ MR, RI 40%, RI stent patent, proximal-mid RCA 40-50%;  [place] 7/09:  EF 55-65%, trivial MR;  [place] 11/18/12: EF 66%, normal LV wall motion, mild to moderate anterior ischemia - reviewed by [facility] and felt to rep breast atten (low risk);  [place] 6/14: mild LVH, EF 55-65%, Gr 2 DD, PASP 44, trivial eff   . Arthritis   . Non-insulin dependent diabetes mellitus   . Diverticulosis   . Diabetic neuropathy   . Vitamin D deficiency   . Degenerative joint disease     left shoulder  . Chronic neck pain   . Shortness of breath     at night  . Headache(784.0)   . Anemia     hx of  . Stroke     "mini- years ago"  . Chronic kidney disease     CREATININE IS UP--GOING TO KIDNEY MD   . CHF  (congestive heart failure)    Past Surgical History  Procedure Laterality Date  . Bladder surgery      Tack  . Partial hysterectomy    . Shoulder surgery      Right  . Cervical spine surgery  2012    8 screws  . Abdominal hysterectomy    . Laminectomy  12/29/2011  . Cardiac stents    . Lumbar laminectomy/decompression microdiscectomy  12/29/2011    Procedure: LUMBAR LAMINECTOMY/DECOMPRESSION MICRODISCECTOMY;  Surgeon: Floyce Stakes, MD;  Location: [place] [facility];  Service: Neurosurgery;  Laterality: N/A;  Lumbar Two through Lumbar Five Laminectomies/Cellsaver  . Back surgery      2007 & 2013  . Cystoscopy with injection N/A 06/03/2013    Procedure: MACROPLASTIQUE  INJECTION ;  Surgeon: Malka So, MD;  Location: [facility];  Service: Urology;  Laterality: N/A;  . Cardiac catheterization  2003, 2005, 2009   Family History  Problem Relation Age of Onset  . Heart disease Other    History  Substance Use Topics  . Smoking status: Former Smoker -- 1.00 packs/day for 30 years    Quit date: 12/04/2001  . Smokeless tobacco: Never Used  . Alcohol Use: Yes  Comment: occasional   OB History   Grav Para Term Preterm Abortions TAB SAB Ect Mult Living                 Review of Systems  Constitutional: Positive for fever and chills.  Respiratory: Positive for cough and shortness of breath.       Allergies  Ace inhibitors; Clopidogrel bisulfate; Codeine; and Penicillins  Home Medications   Current Outpatient Rx  Name  Route  Sig  Dispense  Refill  . albuterol (PROVENTIL HFA;VENTOLIN HFA) 108 (90 BASE) MCG/ACT inhaler   Inhalation   Inhale 2 puffs into the lungs every 6 (six) hours as needed for wheezing.   18 g   2   . aspirin 81 MG EC tablet   Oral   Take 81 mg by mouth daily.           [name] atenolol (TENORMIN) 50 MG tablet   Oral   Take 50 mg by mouth 2 (two) times daily.         . cholecalciferol (VITAMIN D) 1000 UNITS tablet   Oral   Take 1,000 Units by mouth  every morning.         . furosemide (LASIX) 40 MG tablet   Oral   Take 1 tablet (40 mg total) by mouth daily. Take an extra pill if weight is above 3 Lbs.   30 tablet   5   . gabapentin (NEURONTIN) 300 MG capsule   Oral   Take 300 mg by mouth 3 (three) times daily.         . insulin detemir (LEVEMIR) 100 UNIT/ML injection   Subcutaneous   Inject 60 Units into the skin at bedtime.          . insulin lispro (HUMALOG) 100 UNIT/ML injection   Subcutaneous   Inject into the skin See admin instructions. To be administered as directed per sliding scale: 90-150= 10  Units 151-200=11 units 201-250=12 units 251-300= 13 units  To call MD if levels are between 251-300 for three consecutive days         . linagliptin (TRADJENTA) 5 MG TABS tablet   Oral   Take 5 mg by mouth daily.         [name] NIFEdipine (PROCARDIA XL/ADALAT-CC) 90 MG 24 hr tablet   Oral   Take 90 mg by mouth every evening.          . potassium chloride (K-DUR,KLOR-CON) 10 MEQ tablet   Oral   Take 1 tablet (10 mEq total) by mouth once.   2 tablet   0   . rosuvastatin (CRESTOR) 20 MG tablet   Oral   Take 1 tablet (20 mg total) by mouth every evening.   30 tablet   11   . valsartan-hydrochlorothiazide (DIOVAN-HCT) 320-25 MG per tablet   Oral   Take 1 tablet by mouth every morning.   30 tablet   11    BP 150/74  Pulse 92  Temp(Src) 98 F (36.7 C) (Oral)  Resp 18  Ht 5\' 2"  (1.575 m)  Wt 210 lb (95.255 kg)  BMI 38.40 kg/m2  SpO2 92% Physical Exam  Nursing note and vitals reviewed. Constitutional: She is oriented to person, place, and time. She appears well-developed.  HENT:  Head: Normocephalic.  Eyes: Conjunctivae and EOM are normal. No scleral icterus.  Neck: Neck supple. No thyromegaly present.  Cardiovascular: Normal rate and regular rhythm.  Exam reveals no gallop and no friction rub.  No murmur heard. Pulmonary/Chest: No stridor. She has wheezes (moderate wheezing bilaterally). She  has no rales. She exhibits no tenderness.  Abdominal: She exhibits no distension. There is no tenderness. There is no rebound.  Musculoskeletal: Normal range of motion. She exhibits no edema.  Lymphadenopathy:    She has no cervical adenopathy.  Neurological: She is oriented to person, place, and time. She exhibits normal muscle tone. Coordination normal.  Skin: No rash noted. No erythema.  Psychiatric: She has a normal mood and affect. Her behavior is normal.    ED Course  Procedures (including critical care time)  DIAGNOSTIC STUDIES: Oxygen Saturation is 92% on RA, low by my interpretation.    COORDINATION OF CARE: 12:07 PM Discussed treatment plan with pt at bedside and pt agreed to plan.   Labs Review Labs Reviewed  CBC - Abnormal; Notable for the following:    WBC 14.0 (*)    RDW 16.4 (*)    All other components within normal limits  BASIC METABOLIC PANEL - Abnormal; Notable for the following:    Potassium 2.9 (*)    Chloride 94 (*)    Glucose, Bld 171 (*)    BUN 28 (*)    Creatinine, Ser 2.44 (*)    GFR calc non Af Amer 20 (*)    GFR calc Af Amer 23 (*)    All other components within normal limits  PRO B NATRIURETIC PEPTIDE - Abnormal; Notable for the following:    Pro B Natriuretic peptide (BNP) 570.1 (*)    All other components within normal limits  TROPONIN I   Imaging Review Dg Chest Port 1 View  01/21/2014   CLINICAL DATA:  Shortness of breath.  EXAM: PORTABLE CHEST - 1 VIEW  COMPARISON:  10/09/2013  FINDINGS: The cardiac silhouette remains enlarged. The patient has taken a shallower inspiration than on the prior study. There is mild pulmonary vascular congestion with slightly increased prominence of interstitial markings in the lower lungs. No definite pleural effusion or pneumothorax is identified. No acute osseous abnormality is seen. Prior cervical fusion is noted.  IMPRESSION: Shallow inspiration with mild pulmonary vascular congestion. Early interstitial  edema not excluded.   Electronically Signed   By: Logan Bores   On: 01/21/2014 12:11    EKG Interpretation    Date/Time:  Wednesday January 21 2014 11:49:08 EST Ventricular Rate:  81 PR Interval:  136 QRS Duration: 88 QT Interval:  438 QTC Calculation: 508 R Axis:   -22 Text Interpretation:  Normal sinus rhythm Anteroseptal infarct (cited on or before 06-Mar-2002) Abnormal ECG When compared with ECG of 29-Sep-2013 18:01, Premature supraventricular complexes are no longer Present Confirmed by Senai Kingsley  MD, Jasminne Mealy ([id number]) on 01/21/2014 3:59:34 PM            MDM   Final diagnoses:  None    Bronchitis and bronchospasm. Pt improved with tx will send her home with prednisone,  levaquin and close follow up with pcp  Maudry Diego, MD 01/21/14 1600

## 2014-01-21 NOTE — ED Notes (Signed)
To ED from [place] via [facility] for SOB, sts fever X3d, no V/D, 91% RA pta, wheezing per EMS, 3 nebs, 125 mg solo-medrol given by EMS, VSS, 20g LAC

## 2014-01-21 NOTE — Discharge Instructions (Signed)
Follow up Monday with your md as planned.  Return sooner if problems

## 2014-01-21 NOTE — ED Notes (Signed)
Patient does not need anything at this time. 

## 2014-01-26 ENCOUNTER — Ambulatory Visit (INDEPENDENT_AMBULATORY_CARE_PROVIDER_SITE_OTHER): Payer: Medicare Other | Admitting: Family Medicine

## 2014-01-26 ENCOUNTER — Encounter: Payer: Self-pay | Admitting: Family Medicine

## 2014-01-26 VITALS — BP 143/76 | HR 65 | Temp 97.5°F | Ht 62.0 in | Wt 209.2 lb

## 2014-01-26 DIAGNOSIS — N189 Chronic kidney disease, unspecified: Secondary | ICD-10-CM

## 2014-01-26 DIAGNOSIS — I509 Heart failure, unspecified: Secondary | ICD-10-CM

## 2014-01-26 DIAGNOSIS — R0600 Dyspnea, unspecified: Secondary | ICD-10-CM

## 2014-01-26 DIAGNOSIS — R0609 Other forms of dyspnea: Secondary | ICD-10-CM

## 2014-01-26 DIAGNOSIS — E559 Vitamin D deficiency, unspecified: Secondary | ICD-10-CM

## 2014-01-26 DIAGNOSIS — E875 Hyperkalemia: Secondary | ICD-10-CM

## 2014-01-26 DIAGNOSIS — R0989 Other specified symptoms and signs involving the circulatory and respiratory systems: Secondary | ICD-10-CM

## 2014-01-26 DIAGNOSIS — R9389 Abnormal findings on diagnostic imaging of other specified body structures: Secondary | ICD-10-CM

## 2014-01-26 DIAGNOSIS — R918 Other nonspecific abnormal finding of lung field: Secondary | ICD-10-CM

## 2014-01-26 DIAGNOSIS — D72829 Elevated white blood cell count, unspecified: Secondary | ICD-10-CM

## 2014-01-26 DIAGNOSIS — E785 Hyperlipidemia, unspecified: Secondary | ICD-10-CM

## 2014-01-26 DIAGNOSIS — M19012 Primary osteoarthritis, left shoulder: Secondary | ICD-10-CM

## 2014-01-26 DIAGNOSIS — I2510 Atherosclerotic heart disease of native coronary artery without angina pectoris: Secondary | ICD-10-CM

## 2014-01-26 DIAGNOSIS — M19019 Primary osteoarthritis, unspecified shoulder: Secondary | ICD-10-CM

## 2014-01-26 DIAGNOSIS — E119 Type 2 diabetes mellitus without complications: Secondary | ICD-10-CM

## 2014-01-26 DIAGNOSIS — I10 Essential (primary) hypertension: Secondary | ICD-10-CM

## 2014-01-26 DIAGNOSIS — J40 Bronchitis, not specified as acute or chronic: Secondary | ICD-10-CM

## 2014-01-26 DIAGNOSIS — I5031 Acute diastolic (congestive) heart failure: Secondary | ICD-10-CM

## 2014-01-26 LAB — POCT CBC
Granulocyte percent: 67.9 %G (ref 37–80)
HCT, POC: 43.1 % (ref 37.7–47.9)
Hemoglobin: 13.5 g/dL (ref 12.2–16.2)
Lymph, poc: 4.6 — AB (ref 0.6–3.4)
MCH, POC: 25.1 pg — AB (ref 27–31.2)
MCHC: 31.3 g/dL — AB (ref 31.8–35.4)
MCV: 80.1 fL (ref 80–97)
MPV: 8.5 fL (ref 0–99.8)
POC Granulocyte: 11.3 — AB (ref 2–6.9)
POC LYMPH PERCENT: 27.9 %L (ref 10–50)
Platelet Count, POC: 390 10*3/uL (ref 142–424)
RBC: 5.4 M/uL (ref 4.04–5.48)
RDW, POC: 16.4 %
WBC: 16.6 10*3/uL — AB (ref 4.6–10.2)

## 2014-01-26 MED ORDER — BUDESONIDE-FORMOTEROL FUMARATE 160-4.5 MCG/ACT IN AERO: 2.0000 | INHALATION_SPRAY | Freq: Two times a day (BID) | RESPIRATORY_TRACT | Status: DC

## 2014-01-26 NOTE — Progress Notes (Signed)
Patient ID: [name], female   DOB: [birth year], 63 y.o.   MRN: [id number] SUBJECTIVE: CC: Chief Complaint  Patient presents with  . Hospitalization Follow-up    was started on prednisone and antibiotic  states still dont breathe right and see spots on eyes states saw eye dr in dec.     HPI:   Breakfast: bacon & eggs , sometimes cereal Snack: none usually Lunch: salad sometimes; sometimes a  Sandwich; sometimes a meat and 2 veggies Snack: none Supper:  Sometimes leftovers; sometimes a meat and 2 veggies. At night is the problem snacks on ice cream and popcorn.   Here for follow up on her bronchitis and bronchospasm and also: Patient is here for follow up of Diabetes Mellitus: Symptoms evaluated: Denies Nocturia ,Denies Urinary Frequency , denies Blurred vision ,deniesDizziness,denies.Dysuria,denies paresthesias, denies extremity pain or ulcers.Marland Kitchendenies chest pain. has had an annual eye exam.told she has floaters in her eyes and nothing to worry about. do check the feet. Does check CBGs. Average IHW:[id number] 93 this am, highest 230 in the evening. Denies episodes of hypoglycemia. Does have an emergency hypoglycemic plan. admits toCompliance with medications. Denies Problems with medications.  Past Medical History  Diagnosis Date  . Esophageal stricture   . Hyperlipidemia   . Hypertension   . Bronchitis   . CAD (coronary artery disease)     s/p Stenting in 2005;  [facility] 7/09: Vigorous LV function, 2-3+ MR, RI 40%, RI stent patent, proximal-mid RCA 40-50%;  [facility] 7/09:  EF 55-65%, trivial MR;  Myoview 11/18/12: EF 66%, normal LV wall motion, mild to moderate anterior ischemia - reviewed by [facility] and felt to rep breast atten (low risk);  Echo 6/14: mild LVH, EF 55-65%, Gr 2 DD, PASP 44, trivial eff   . Arthritis   . Non-insulin dependent diabetes mellitus   . Diverticulosis   . Diabetic neuropathy   . Vitamin D deficiency   . Degenerative joint disease     left shoulder  .  Chronic neck pain   . Shortness of breath     at night  . Headache(784.0)   . Anemia     hx of  . Stroke     "mini- years ago"  . Chronic kidney disease     CREATININE IS UP--GOING TO KIDNEY MD   . CHF (congestive heart failure)    Past Surgical History  Procedure Laterality Date  . Bladder surgery      Tack  . Partial hysterectomy    . Shoulder surgery      Right  . Cervical spine surgery  2012    8 screws  . Abdominal hysterectomy    . Laminectomy  12/29/2011  . Cardiac stents    . Lumbar laminectomy/decompression microdiscectomy  12/29/2011    Procedure: LUMBAR LAMINECTOMY/DECOMPRESSION MICRODISCECTOMY;  Surgeon: Floyce Stakes, MD;  Location: [place] [facility];  Service: Neurosurgery;  Laterality: N/A;  Lumbar Two through Lumbar Five Laminectomies/Cellsaver  . Back surgery      2007 & 2013  . Cystoscopy with injection N/A 06/03/2013    Procedure: MACROPLASTIQUE  INJECTION ;  Surgeon: Malka So, MD;  Location: [facility];  Service: Urology;  Laterality: N/A;  . Cardiac catheterization  2003, 2005, 2009   History   Social History  . Marital Status: Widowed    Spouse Name: N/A    Number of Children: N/A  . Years of Education: N/A   Occupational History  . Not on file.  Social History Main Topics  . Smoking status: Former Smoker -- 1.00 packs/day for 30 years    Quit date: 12/04/2001  . Smokeless tobacco: Never Used  . Alcohol Use: Yes     Comment: occasional  . Drug Use: No  . Sexual Activity: No   Other Topics Concern  . Not on file   Social History Narrative   Lives in [place]   Family History  Problem Relation Age of Onset  . Heart disease Other    Current Outpatient Prescriptions on File Prior to Visit  Medication Sig Dispense Refill  . albuterol (PROVENTIL HFA;VENTOLIN HFA) 108 (90 BASE) MCG/ACT inhaler Inhale 2 puffs into the lungs every 6 (six) hours as needed for wheezing.  18 g  2  . atenolol (TENORMIN) 50 MG tablet Take 50 mg by mouth 2 (two)  times daily.      . furosemide (LASIX) 40 MG tablet Take 1 tablet (40 mg total) by mouth daily. Take an extra pill if weight is above 3 Lbs.  30 tablet  5  . gabapentin (NEURONTIN) 300 MG capsule Take 300 mg by mouth 3 (three) times daily.      . insulin detemir (LEVEMIR) 100 UNIT/ML injection Inject 60 Units into the skin at bedtime.       . insulin lispro (HUMALOG) 100 UNIT/ML injection Inject into the skin See admin instructions. To be administered as directed per sliding scale: 90-150= 10  Units 151-200=11 units 201-250=12 units 251-300= 13 units  To call MD if levels are between 251-300 for three consecutive days      . levofloxacin (LEVAQUIN) 500 MG tablet Take 1 tablet (500 mg total) by mouth daily.  7 tablet  0  . linagliptin (TRADJENTA) 5 MG TABS tablet Take 5 mg by mouth daily.      [name] NIFEdipine (PROCARDIA XL/ADALAT-CC) 90 MG 24 hr tablet Take 90 mg by mouth every evening.       . predniSONE (DELTASONE) 10 MG tablet Take 2 tablets (20 mg total) by mouth daily.  14 tablet  0  . rosuvastatin (CRESTOR) 20 MG tablet Take 1 tablet (20 mg total) by mouth every evening.  30 tablet  11  . valsartan-hydrochlorothiazide (DIOVAN-HCT) 320-25 MG per tablet Take 1 tablet by mouth every morning.  30 tablet  11  . aspirin 81 MG EC tablet Take 81 mg by mouth daily.        . cholecalciferol (VITAMIN D) 1000 UNITS tablet Take 1,000 Units by mouth every morning.      . potassium chloride (K-DUR,KLOR-CON) 10 MEQ tablet Take 1 tablet (10 mEq total) by mouth once.  2 tablet  0   No current facility-administered medications on file prior to visit.   Allergies  Allergen Reactions  . Ace Inhibitors     unknown  . Clopidogrel Bisulfate     REACTION: Sick, Headache, "Felt terrible"  . Codeine Other (See Comments)    REACTION: hallucinations  . Penicillins     unknown   Immunization History  Administered Date(s) Administered  . DTaP 07/19/2010  . Influenza Whole 09/03/2006  . Influenza,inj,Quad  PF,36+ Mos 09/11/2013   Prior to Admission medications   Medication Sig Start Date End Date Taking? Authorizing Provider  albuterol (PROVENTIL HFA;VENTOLIN HFA) 108 (90 BASE) MCG/ACT inhaler Inhale 2 puffs into the lungs every 6 (six) hours as needed for wheezing. 11/24/13  Yes Vernie Shanks, MD  atenolol (TENORMIN) 50 MG tablet Take 50 mg by mouth 2 (  two) times daily.   Yes Historical Provider, MD  furosemide (LASIX) 40 MG tablet Take 1 tablet (40 mg total) by mouth daily. Take an extra pill if weight is above 3 Lbs. 10/09/13  Yes Vernie Shanks, MD  gabapentin (NEURONTIN) 300 MG capsule Take 300 mg by mouth 3 (three) times daily.   Yes Historical Provider, MD  insulin detemir (LEVEMIR) 100 UNIT/ML injection Inject 60 Units into the skin at bedtime.    Yes Historical Provider, MD  insulin lispro (HUMALOG) 100 UNIT/ML injection Inject into the skin See admin instructions. To be administered as directed per sliding scale: 90-150= 10  Units 151-200=11 units 201-250=12 units 251-300= 13 units  To call MD if levels are between 251-300 for three consecutive days   Yes Historical Provider, MD  levofloxacin (LEVAQUIN) 500 MG tablet Take 1 tablet (500 mg total) by mouth daily. 01/21/14  Yes Maudry Diego, MD  linagliptin (TRADJENTA) 5 MG TABS tablet Take 5 mg by mouth daily.   Yes Historical Provider, MD  NIFEdipine (PROCARDIA XL/ADALAT-CC) 90 MG 24 hr tablet Take 90 mg by mouth every evening.    Yes Historical Provider, MD  potassium chloride (K-DUR) 10 MEQ tablet  12/10/13  Yes Historical Provider, MD  predniSONE (DELTASONE) 10 MG tablet Take 2 tablets (20 mg total) by mouth daily. 01/21/14  Yes Maudry Diego, MD  rosuvastatin (CRESTOR) 20 MG tablet Take 1 tablet (20 mg total) by mouth every evening. 07/30/13  Yes Vernie Shanks, MD  valsartan-hydrochlorothiazide (DIOVAN-HCT) 320-25 MG per tablet Take 1 tablet by mouth every morning. 07/30/13  Yes Vernie Shanks, MD  aspirin 81 MG EC tablet Take 81 mg  by mouth daily.      Historical Provider, MD  cholecalciferol (VITAMIN D) 1000 UNITS tablet Take 1,000 Units by mouth every morning.    Historical Provider, MD  potassium chloride (K-DUR,KLOR-CON) 10 MEQ tablet Take 1 tablet (10 mEq total) by mouth once. 12/01/13   Vernie Shanks, MD     ROS: As above in the HPI. All other systems are stable or negative.  OBJECTIVE: APPEARANCE:  Patient in no acute distress.The patient appeared well nourished and normally developed. Acyanotic. Waist: VITAL SIGNS:BP 143/76  Pulse 65  Temp(Src) 97.5 F (36.4 C) (Oral)  Ht _0  (1.575 m)  Wt 209 lb 3.2 oz (94.892 kg)  BMI 38.25 kg/m2  SpO2 98%  AAF  SKIN: warm and  Dry without overt rashes, tattoos and scars  HEAD and Neck: without JVD, Head and scalp: normal Eyes:No scleral icterus. Fundi normal, eye movements normal. Ears: Auricle normal, canal normal, Tympanic membranes normal, insufflation normal. Nose: normal Throat: normal Neck & thyroid: normal  CHEST & LUNGS: Chest wall: normal Lungs: Coarse bs. No Rales , scattered  Wheezes, and rhonchi.  CVS: Reveals the PMI to be normally located. Regular rhythm, First and Second Heart sounds are normal,  absence of murmurs, rubs or gallops. Peripheral vasculature: Radial pulses: normal Dorsal pedis pulses: normal Posterior pulses: normal  ABDOMEN:  Appearance: normal Benign, no organomegaly, no masses, no Abdominal Aortic enlargement. No Guarding , no rebound. No Bruits. Bowel sounds: normal  RECTAL: N/A GU: N/A  EXTREMETIES: nonedematous.  NEUROLOGIC: oriented to time,place and person; nonfocal. Strength is normal Sensory is normal Reflexes are normal Cranial Nerves are normal.  ASSESSMENT:  HYPERTENSION, BENIGN  HYPERLIPIDEMIA - Plan: BMP8+EGFR, Lipid panel  Dyspnea  DM  CKD (chronic kidney disease) - Plan: WHQ7+RFFM  Acute diastolic CHF (congestive  heart failure) - Plan: Brain natriuretic peptide  Vitamin D  deficiency  CAD  Degenerative joint disease of left shoulder  Abnormal chest x-ray  BRONCHITIS - Plan: POCT CBC, budesonide-formoterol (SYMBICORT) 160-4.5 MCG/ACT inhaler  Hyperkalemia - Plan: BMP8+EGFR  Leukocytosis, unspecified - Plan: POCT CBC  PLAN:        Dr Paula Libra Recommendations  For nutrition information, I recommend books:  1).Eat to Live by Dr Excell Seltzer. 2).Prevent and Reverse Heart Disease by Dr Karl Luke. 3) Dr Janene Harvey Book:  Program to Reverse Diabetes  Exercise recommendations are:  If unable to walk, then the patient can exercise in a chair 3 times a day. By flapping arms like a bird gently and raising legs outwards to the front.  If ambulatory, the patient can go for walks for 30 minutes 3 times a week. Then increase the intensity and duration as tolerated.  Goal is to try to attain exercise frequency to 5 times a week.  If applicable: Best to perform resistance exercises (machines or weights) 2 days a week and cardio type exercises 3 days per week.   demonstrated use of the symbicort.  Use her proair as a rescue drug.  Discussed with patient her multiple medical problems and the effect on prognosis and the need to change her diet and activities to improve her health.   Orders Placed This Encounter  Procedures  . Brain natriuretic peptide  . BMP8+EGFR  . Lipid panel  . POCT CBC   Meds ordered this encounter  Medications  . potassium chloride (K-DUR) 10 MEQ tablet    Sig:   . budesonide-formoterol (SYMBICORT) 160-4.5 MCG/ACT inhaler    Sig: Inhale 2 puffs into the lungs 2 (two) times daily.    Dispense:  2 Inhaler    Refill:  0   There are no discontinued medications. Return in about 1 week (around 02/02/2014) for Recheck medical problems.  Katrenia Alkins P. Jacelyn Grip, M.D.

## 2014-01-26 NOTE — Patient Instructions (Signed)
      Dr Shiza Thelen's Recommendations  For nutrition information, I recommend books:  1).Eat to Live by Dr Joel Fuhrman. 2).Prevent and Reverse Heart Disease by Dr Caldwell Esselstyn. 3) Dr Neal Barnard's Book:  Program to Reverse Diabetes  Exercise recommendations are:  If unable to walk, then the patient can exercise in a chair 3 times a day. By flapping arms like a bird gently and raising legs outwards to the front.  If ambulatory, the patient can go for walks for 30 minutes 3 times a week. Then increase the intensity and duration as tolerated.  Goal is to try to attain exercise frequency to 5 times a week.  If applicable: Best to perform resistance exercises (machines or weights) 2 days a week and cardio type exercises 3 days per week.  

## 2014-01-27 ENCOUNTER — Ambulatory Visit: Payer: Medicare Other | Admitting: Family Medicine

## 2014-01-27 LAB — BMP8+EGFR
BUN/Creatinine Ratio: 18 (ref 11–26)
BUN: 42 mg/dL — ABNORMAL HIGH (ref 8–27)
CO2: 27 mmol/L (ref 18–29)
Calcium: 10 mg/dL (ref 8.7–10.3)
Chloride: 97 mmol/L (ref 97–108)
Creatinine, Ser: 2.36 mg/dL — ABNORMAL HIGH (ref 0.57–1.00)
GFR calc Af Amer: 25 mL/min/{1.73_m2} — ABNORMAL LOW (ref >59)
GFR calc non Af Amer: 22 mL/min/{1.73_m2} — ABNORMAL LOW (ref >59)
Glucose: 126 mg/dL — ABNORMAL HIGH (ref 65–99)
Potassium: 3.7 mmol/L (ref 3.5–5.2)
Sodium: 143 mmol/L (ref 134–144)

## 2014-01-27 LAB — LIPID PANEL
Chol/HDL Ratio: 4 ratio units (ref 0.0–4.4)
Cholesterol, Total: 202 mg/dL — ABNORMAL HIGH (ref 100–199)
HDL: 51 mg/dL (ref >39)
LDL Calculated: 127 mg/dL — ABNORMAL HIGH (ref 0–99)
Triglycerides: 118 mg/dL (ref 0–149)
VLDL Cholesterol Cal: 24 mg/dL (ref 5–40)

## 2014-01-27 LAB — BRAIN NATRIURETIC PEPTIDE: BNP: 51.9 pg/mL (ref 0.0–100.0)

## 2014-01-28 ENCOUNTER — Other Ambulatory Visit: Payer: Self-pay | Admitting: Family Medicine

## 2014-01-28 MED ORDER — LEVOFLOXACIN 750 MG PO TABS: 375.0000 mg | ORAL_TABLET | Freq: Every day | ORAL | Status: DC

## 2014-01-28 NOTE — Progress Notes (Signed)
Quick Note:  Call Patient Labs that are abnormal: WBC is still high. This may be from the prednisone or from infection or both. She should finish the antibiotic today. Will Rx for 5 days more until she sees me next week. The lipids are not at goal. Will review with her next week  The rest are stable especially the kidney failure.  Recommendations: 6 days more of levaquin. The dose is adjusted for her kidney disease. Ordered in EPIC.1/2 of the 750 mg daily for 6 days.   ______

## 2014-02-02 ENCOUNTER — Ambulatory Visit (INDEPENDENT_AMBULATORY_CARE_PROVIDER_SITE_OTHER): Payer: Medicare Other | Admitting: Family Medicine

## 2014-02-02 ENCOUNTER — Encounter: Payer: Self-pay | Admitting: Family Medicine

## 2014-02-02 VITALS — BP 134/67 | HR 75 | Temp 98.8°F | Ht 62.0 in | Wt 220.2 lb

## 2014-02-02 DIAGNOSIS — I10 Essential (primary) hypertension: Secondary | ICD-10-CM

## 2014-02-02 DIAGNOSIS — E119 Type 2 diabetes mellitus without complications: Secondary | ICD-10-CM

## 2014-02-02 DIAGNOSIS — I2510 Atherosclerotic heart disease of native coronary artery without angina pectoris: Secondary | ICD-10-CM

## 2014-02-02 DIAGNOSIS — D72829 Elevated white blood cell count, unspecified: Secondary | ICD-10-CM

## 2014-02-02 DIAGNOSIS — J40 Bronchitis, not specified as acute or chronic: Secondary | ICD-10-CM

## 2014-02-02 DIAGNOSIS — E785 Hyperlipidemia, unspecified: Secondary | ICD-10-CM

## 2014-02-02 DIAGNOSIS — E559 Vitamin D deficiency, unspecified: Secondary | ICD-10-CM

## 2014-02-02 DIAGNOSIS — I509 Heart failure, unspecified: Secondary | ICD-10-CM

## 2014-02-02 DIAGNOSIS — I5031 Acute diastolic (congestive) heart failure: Secondary | ICD-10-CM

## 2014-02-02 DIAGNOSIS — N189 Chronic kidney disease, unspecified: Secondary | ICD-10-CM

## 2014-02-02 MED ORDER — LEVOFLOXACIN 750 MG PO TABS: 375.0000 mg | ORAL_TABLET | Freq: Every day | ORAL | Status: DC

## 2014-02-02 NOTE — Progress Notes (Signed)
Patient ID: [name], female   DOB: 1951/02/18, 63 y.o.   MRN: [id number] SUBJECTIVE: CC: Chief Complaint  Patient presents with  . Follow-up    breathing problem, discuss lab results    HPI: Here for follow up. Breathing better. Changed her diet to more vegetables and her sugars have been lower. In the mornings, sugars has been in the  70s.some days. Patient was not aware of the Rx for the levaquin. Past Medical History  Diagnosis Date  . Esophageal stricture   . Hyperlipidemia   . Hypertension   . Bronchitis   . CAD (coronary artery disease)     s/p Stenting in 2005;  [facility] 7/09: Vigorous LV function, 2-3+ MR, RI 40%, RI stent patent, proximal-mid RCA 40-50%;  [facility] 7/09:  EF 55-65%, trivial MR;  [facility] 11/18/12: EF 66%, normal LV wall motion, mild to moderate anterior ischemia - reviewed by [facility] and felt to rep breast atten (low risk);  [facility] 6/14: mild LVH, EF 55-65%, Gr 2 DD, PASP 44, trivial eff   . Arthritis   . Non-insulin dependent diabetes mellitus   . Diverticulosis   . Diabetic neuropathy   . Vitamin D deficiency   . Degenerative joint disease     left shoulder  . Chronic neck pain   . Shortness of breath     at night  . Headache(784.0)   . Anemia     hx of  . Stroke     "mini- years ago"  . Chronic kidney disease     CREATININE IS UP--GOING TO KIDNEY MD   . CHF (congestive heart failure)    Past Surgical History  Procedure Laterality Date  . Bladder surgery      Tack  . Partial hysterectomy    . Shoulder surgery      Right  . Cervical spine surgery  2012    8 screws  . Abdominal hysterectomy    . Laminectomy  12/29/2011  . Cardiac stents    . Lumbar laminectomy/decompression microdiscectomy  12/29/2011    Procedure: LUMBAR LAMINECTOMY/DECOMPRESSION MICRODISCECTOMY;  Surgeon: Floyce Stakes, MD;  Location: [place] [facility] OR[facility];  Service: Neurosurgery;  Laterality: N/A;  Lumbar Two through Lumbar Five Laminectomies/Cellsaver  . Back surgery      2007 & 2013   . Cystoscopy with injection N/A 06/03/2013    Procedure: MACROPLASTIQUE  INJECTION ;  Surgeon: Malka So, MD;  Location: [facility];  Service: Urology;  Laterality: N/A;  . Cardiac catheterization  2003, 2005, 2009   History   Social History  . Marital Status: Widowed    Spouse Name: N/A    Number of Children: N/A  . Years of Education: N/A   Occupational History  . Not on file.   Social History Main Topics  . Smoking status: Former Smoker -- 1.00 packs/day for 30 years    Quit date: 12/04/2001  . Smokeless tobacco: Never Used  . Alcohol Use: Yes     Comment: occasional  . Drug Use: No  . Sexual Activity: No   Other Topics Concern  . Not on file   Social History Narrative   Lives in [place]   Family History  Problem Relation Age of Onset  . Heart disease Other    Current Outpatient Prescriptions on File Prior to Visit  Medication Sig Dispense Refill  . albuterol (PROVENTIL HFA;VENTOLIN HFA) 108 (90 BASE) MCG/ACT inhaler Inhale 2 puffs into the lungs every 6 (six) hours as needed for  wheezing.  18 g  2  . aspirin 81 MG EC tablet Take 81 mg by mouth daily.        [name] atenolol (TENORMIN) 50 MG tablet Take 50 mg by mouth 2 (two) times daily.      . budesonide-formoterol (SYMBICORT) 160-4.5 MCG/ACT inhaler Inhale 2 puffs into the lungs 2 (two) times daily.  2 Inhaler  0  . cholecalciferol (VITAMIN D) 1000 UNITS tablet Take 1,000 Units by mouth every morning.      . furosemide (LASIX) 40 MG tablet Take 1 tablet (40 mg total) by mouth daily. Take an extra pill if weight is above 3 Lbs.  30 tablet  5  . gabapentin (NEURONTIN) 300 MG capsule Take 300 mg by mouth 3 (three) times daily.      . insulin detemir (LEVEMIR) 100 UNIT/ML injection Inject 60 Units into the skin at bedtime.       . insulin lispro (HUMALOG) 100 UNIT/ML injection Inject into the skin See admin instructions. To be administered as directed per sliding scale: 90-150= 10  Units 151-200=11 units 201-250=12  units 251-300= 13 units  To call MD if levels are between 251-300 for three consecutive days      . linagliptin (TRADJENTA) 5 MG TABS tablet Take 5 mg by mouth daily.      [name] NIFEdipine (PROCARDIA XL/ADALAT-CC) 90 MG 24 hr tablet Take 90 mg by mouth every evening.       . potassium chloride (K-DUR) 10 MEQ tablet       . potassium chloride (K-DUR,KLOR-CON) 10 MEQ tablet Take 1 tablet (10 mEq total) by mouth once.  2 tablet  0  . rosuvastatin (CRESTOR) 20 MG tablet Take 1 tablet (20 mg total) by mouth every evening.  30 tablet  11  . valsartan-hydrochlorothiazide (DIOVAN-HCT) 320-25 MG per tablet Take 1 tablet by mouth every morning.  30 tablet  11  . predniSONE (DELTASONE) 10 MG tablet Take 2 tablets (20 mg total) by mouth daily.  14 tablet  0   No current facility-administered medications on file prior to visit.   Allergies  Allergen Reactions  . Ace Inhibitors     unknown  . Clopidogrel Bisulfate     REACTION: Sick, Headache, "Felt terrible"  . Codeine Other (See Comments)    REACTION: hallucinations  . Penicillins     unknown   Immunization History  Administered Date(s) Administered  . DTaP 07/19/2010  . Influenza Whole 09/03/2006  . Influenza,inj,Quad PF,36+ Mos 09/11/2013   Prior to Admission medications   Medication Sig Start Date End Date Taking? Authorizing Provider  albuterol (PROVENTIL HFA;VENTOLIN HFA) 108 (90 BASE) MCG/ACT inhaler Inhale 2 puffs into the lungs every 6 (six) hours as needed for wheezing. 11/24/13  Yes Vernie Shanks, MD  aspirin 81 MG EC tablet Take 81 mg by mouth daily.     Yes Historical Provider, MD  atenolol (TENORMIN) 50 MG tablet Take 50 mg by mouth 2 (two) times daily.   Yes Historical Provider, MD  budesonide-formoterol (SYMBICORT) 160-4.5 MCG/ACT inhaler Inhale 2 puffs into the lungs 2 (two) times daily. 01/26/14  Yes Vernie Shanks, MD  cholecalciferol (VITAMIN D) 1000 UNITS tablet Take 1,000 Units by mouth every morning.   Yes Historical  Provider, MD  furosemide (LASIX) 40 MG tablet Take 1 tablet (40 mg total) by mouth daily. Take an extra pill if weight is above 3 Lbs. 10/09/13  Yes Vernie Shanks, MD  gabapentin (NEURONTIN) 300 MG capsule  Take 300 mg by mouth 3 (three) times daily.   Yes Historical Provider, MD  insulin detemir (LEVEMIR) 100 UNIT/ML injection Inject 60 Units into the skin at bedtime.    Yes Historical Provider, MD  insulin lispro (HUMALOG) 100 UNIT/ML injection Inject into the skin See admin instructions. To be administered as directed per sliding scale: 90-150= 10  Units 151-200=11 units 201-250=12 units 251-300= 13 units  To call MD if levels are between 251-300 for three consecutive days   Yes Historical Provider, MD  linagliptin (TRADJENTA) 5 MG TABS tablet Take 5 mg by mouth daily.   Yes Historical Provider, MD  NIFEdipine (PROCARDIA XL/ADALAT-CC) 90 MG 24 hr tablet Take 90 mg by mouth every evening.    Yes Historical Provider, MD  potassium chloride (K-DUR) 10 MEQ tablet  12/10/13  Yes Historical Provider, MD  potassium chloride (K-DUR,KLOR-CON) 10 MEQ tablet Take 1 tablet (10 mEq total) by mouth once. 12/01/13  Yes Vernie Shanks, MD  rosuvastatin (CRESTOR) 20 MG tablet Take 1 tablet (20 mg total) by mouth every evening. 07/30/13  Yes Vernie Shanks, MD  valsartan-hydrochlorothiazide (DIOVAN-HCT) 320-25 MG per tablet Take 1 tablet by mouth every morning. 07/30/13  Yes Vernie Shanks, MD  levofloxacin (LEVAQUIN) 750 MG tablet Take 0.5 tablets (375 mg total) by mouth daily. 01/28/14   Vernie Shanks, MD  predniSONE (DELTASONE) 10 MG tablet Take 2 tablets (20 mg total) by mouth daily. 01/21/14   Maudry Diego, MD     ROS: As above in the HPI. All other systems are stable or negative.  OBJECTIVE: APPEARANCE:  Patient in no acute distress.The patient appeared well nourished and normally developed. Acyanotic. Waist: VITAL SIGNS:BP 134/67  Pulse 75  Temp(Src) 98.8 F (37.1 C) (Oral)  Ht '5\' 2"'  (1.575 m)   Wt 220 lb 3.2 oz (99.882 kg)  BMI 40.26 kg/m2  SpO2 96%  AAF SKIN: warm and  Dry without overt rashes, tattoos and scars  HEAD and Neck: without JVD, Head and scalp: normal Eyes:No scleral icterus. Fundi normal, eye movements normal. Ears: Auricle normal, canal normal, Tympanic membranes normal, insufflation normal. Nose: normal Throat: normal Neck & thyroid: normal  CHEST & LUNGS: Chest wall: normal Lungs: coarse breath sounds.no wheezes, no rales.  CVS: Reveals the PMI to be normally located. Regular rhythm, First and Second Heart sounds are normal,  absence of murmurs, rubs or gallops. Peripheral vasculature: Radial pulses: normal  ABDOMEN:  Appearance:obese Benign, no organomegaly, no masses, no Abdominal Aortic enlargement. No Guarding , no rebound. No Bruits. Bowel sounds: normal  RECTAL: N/A GU: N/A  EXTREMETIES: nonedematous.  MUSCULOSKELETAL:  Spine: normal Joints: intact  NEUROLOGIC: oriented to time,place and person; nonfocal. Results for orders placed in visit on 01/26/14  BRAIN NATRIURETIC PEPTIDE      Result Value Ref Range   BNP 51.9  0.0 - 100.0 pg/mL  BMP8+EGFR      Result Value Ref Range   Glucose 126 (*) 65 - 99 mg/dL   BUN 42 (*) 8 - 27 mg/dL   Creatinine, Ser 2.36 (*) 0.57 - 1.00 mg/dL   GFR calc non Af Amer 22 (*) >59 mL/min/1.73   GFR calc Af Amer 25 (*) >59 mL/min/1.73   BUN/Creatinine Ratio 18  11 - 26   Sodium 143  134 - 144 mmol/L   Potassium 3.7  3.5 - 5.2 mmol/L   Chloride 97  97 - 108 mmol/L   CO2 27  18 - 29 mmol/L  Calcium 10.0  8.7 - 10.3 mg/dL  LIPID PANEL      Result Value Ref Range   Cholesterol, Total 202 (*) 100 - 199 mg/dL   Triglycerides 118  0 - 149 mg/dL   HDL 51  >39 mg/dL   VLDL Cholesterol Cal 24  5 - 40 mg/dL   LDL Calculated 127 (*) 0 - 99 mg/dL   Chol/HDL Ratio 4.0  0.0 - 4.4 ratio units  POCT CBC      Result Value Ref Range   WBC 16.6 (*) 4.6 - 10.2 K/uL   Lymph, poc 4.6 (*) 0.6 - 3.4   POC  LYMPH PERCENT 27.9  10 - 50 %L   POC Granulocyte 11.3 (*) 2 - 6.9   Granulocyte percent 67.9  37 - 80 %G   RBC 5.4  4.04 - 5.48 M/uL   Hemoglobin 13.5  12.2 - 16.2 g/dL   HCT, POC 43.1  37.7 - 47.9 %   MCV 80.1  80 - 97 fL   MCH, POC 25.1 (*) 27 - 31.2 pg   MCHC 31.3 (*) 31.8 - 35.4 g/dL   RDW, POC 16.4     Platelet Count, POC 390.0  142 - 424 K/uL   MPV 8.5  0 - 99.8 fL    ASSESSMENT: BRONCHITIS  CAD  CKD (chronic kidney disease)  DM  HYPERLIPIDEMIA  HYPERTENSION, BENIGN  Acute diastolic CHF (congestive heart failure)  Vitamin D deficiency  Leukocytosis, unspecified  PLAN: Reviewed labs with patient. Will reorder the antibiotics.  No orders of the defined types were placed in this encounter.   Meds ordered this encounter  Medications  . levofloxacin (LEVAQUIN) 750 MG tablet    Sig: Take 0.5 tablets (375 mg total) by mouth daily.    Dispense:  3 tablet    Refill:  0   Medications Discontinued During This Encounter  Medication Reason  . levofloxacin (LEVAQUIN) 750 MG tablet Reorder   Return in about 2 weeks (around 02/16/2014).recheck CBC Also advised that she inform DR Dorris Fetch that she is eating better and her CBGs are lower. In the meantime she needs to reduce her Long acting insulin by 10 Units and reduce the dose of the short acting 2 to 5 units per dose. As she loses weight and eats more veggies her CBGs will be lower. Risks for hypoglycemia discussed.  Quinlynn Cuthbert P. Jacelyn Grip, M.D.

## 2014-02-03 ENCOUNTER — Other Ambulatory Visit: Payer: Self-pay | Admitting: Family Medicine

## 2014-02-19 ENCOUNTER — Ambulatory Visit: Payer: Medicare Other | Admitting: Family Medicine

## 2014-03-04 ENCOUNTER — Other Ambulatory Visit: Payer: Self-pay | Admitting: Family Medicine

## 2014-03-12 ENCOUNTER — Other Ambulatory Visit: Payer: Self-pay

## 2014-03-18 ENCOUNTER — Ambulatory Visit (INDEPENDENT_AMBULATORY_CARE_PROVIDER_SITE_OTHER): Payer: Medicare Other | Admitting: Cardiology

## 2014-03-18 ENCOUNTER — Encounter: Payer: Self-pay | Admitting: Cardiology

## 2014-03-18 VITALS — BP 176/91 | HR 81 | Ht 60.0 in | Wt 222.0 lb

## 2014-03-18 DIAGNOSIS — I509 Heart failure, unspecified: Secondary | ICD-10-CM

## 2014-03-18 DIAGNOSIS — M79606 Pain in leg, unspecified: Secondary | ICD-10-CM

## 2014-03-18 DIAGNOSIS — I5031 Acute diastolic (congestive) heart failure: Secondary | ICD-10-CM

## 2014-03-18 DIAGNOSIS — M79609 Pain in unspecified limb: Secondary | ICD-10-CM

## 2014-03-18 DIAGNOSIS — I2510 Atherosclerotic heart disease of native coronary artery without angina pectoris: Secondary | ICD-10-CM

## 2014-03-18 MED ORDER — VALSARTAN 320 MG PO TABS: 320.0000 mg | ORAL_TABLET | Freq: Every day | ORAL | Status: DC

## 2014-03-18 NOTE — Patient Instructions (Signed)
The current medical regimen is effective;  continue present plan and medications.  Your physician has requested that you have a lower extremity arterial exercise duplex. During this test, exercise and ultrasound are used to evaluate arterial blood flow in the legs. Allow one hour for this exam. There are no restrictions or special instructions.  Follow up in 6 months with Dr Percival Spanish.  You will receive a letter in the mail 2 months before you are due.  Please call us when you receive this letter to schedule your follow up appointment.

## 2014-03-18 NOTE — Progress Notes (Signed)
HPI The patient presents for follow up of CAD and diastolic heart failure.  Today she has a number of complaints. She's had some soreness in her chest but this seems to be related to movements and she has been doing some exercises with her arms.  She complains of bilateral pain in her thighs. She also has pain in her feet which she ascribes to neuropathy. She is having "floaters".  She was recently taken off of HCTZ and she says that she has swelling all over. Her weight is up a couple of pounds. She thinks she has more dyspnea than she had previously. She's not describing new PND or orthopnea. She's not having new palpitations, presyncope or syncope. Of note she did have labs demonstrating a creatinine most recently of 1.93 which is down from previous.this  Allergies  Allergen Reactions  . Ace Inhibitors     unknown  . Clopidogrel Bisulfate     REACTION: Sick, Headache, "Felt terrible"  . Codeine Other (See Comments)    REACTION: hallucinations  . Penicillins     unknown    Current Outpatient Prescriptions  Medication Sig Dispense Refill  . albuterol (PROVENTIL HFA;VENTOLIN HFA) 108 (90 BASE) MCG/ACT inhaler Inhale 2 puffs into the lungs every 6 (six) hours as needed for wheezing.  18 g  2  . aspirin 81 MG EC tablet Take 81 mg by mouth daily.        [name] atenolol (TENORMIN) 50 MG tablet TAKE (1) TABLET TWICE DAILY.  60 tablet  5  . budesonide-formoterol (SYMBICORT) 160-4.5 MCG/ACT inhaler Inhale 2 puffs into the lungs 2 (two) times daily.  2 Inhaler  0  . cholecalciferol (VITAMIN D) 1000 UNITS tablet Take 1,000 Units by mouth every morning.      . furosemide (LASIX) 40 MG tablet Take 1 tablet (40 mg total) by mouth daily. Take an extra pill if weight is above 3 Lbs.  30 tablet  5  . gabapentin (NEURONTIN) 300 MG capsule TAKE 1 CAPSULE 3 TIMES DAILY.  90 capsule  0  . insulin detemir (LEVEMIR) 100 UNIT/ML injection Inject 60 Units into the skin at bedtime.       . insulin lispro  (HUMALOG) 100 UNIT/ML injection Inject into the skin See admin instructions. To be administered as directed per sliding scale: 90-150= 10  Units 151-200=11 units 201-250=12 units 251-300= 13 units  To call MD if levels are between 251-300 for three consecutive days      . linagliptin (TRADJENTA) 5 MG TABS tablet Take 5 mg by mouth daily.      [name] NIFEdipine (PROCARDIA XL/ADALAT-CC) 90 MG 24 hr tablet Take 90 mg by mouth every evening.       . potassium chloride (K-DUR,KLOR-CON) 10 MEQ tablet Take 1 tablet (10 mEq total) by mouth once.  2 tablet  0  . rosuvastatin (CRESTOR) 20 MG tablet Take 1 tablet (20 mg total) by mouth every evening.  30 tablet  11  . valsartan-hydrochlorothiazide (DIOVAN-HCT) 320-25 MG per tablet Take 1 tablet by mouth every morning.  30 tablet  11   No current facility-administered medications for this visit.    Past Medical History  Diagnosis Date  . Esophageal stricture   . Hyperlipidemia   . Hypertension   . Bronchitis   . CAD (coronary artery disease)     s/p Stenting in 2005;  [place] 7/09: Vigorous LV function, 2-3+ MR, RI 40%, RI stent patent, proximal-mid RCA 40-50%;  [place] 7/09:  EF 55-65%, trivial MR;  Myoview 11/18/12: EF 66%, normal LV wall motion, mild to moderate anterior ischemia - reviewed by [facility] and felt to rep breast atten (low risk);  Echo 6/14: mild LVH, EF 55-65%, Gr 2 DD, PASP 44, trivial eff   . Arthritis   . Non-insulin dependent diabetes mellitus   . Diverticulosis   . Diabetic neuropathy   . Vitamin D deficiency   . Degenerative joint disease     left shoulder  . Chronic neck pain   . Shortness of breath     at night  . Headache(784.0)   . Anemia     hx of  . Stroke     "mini- years ago"  . Chronic kidney disease     CREATININE IS UP--GOING TO KIDNEY MD   . CHF (congestive heart failure)     Past Surgical History  Procedure Laterality Date  . Bladder surgery      Tack  . Partial hysterectomy    . Shoulder surgery      Right  .  Cervical spine surgery  2012    8 screws  . Abdominal hysterectomy    . Laminectomy  12/29/2011  . Cardiac stents    . Lumbar laminectomy/decompression microdiscectomy  12/29/2011    Procedure: LUMBAR LAMINECTOMY/DECOMPRESSION MICRODISCECTOMY;  Surgeon: Floyce Stakes, MD;  Location: [facility];  Service: Neurosurgery;  Laterality: N/A;  Lumbar Two through Lumbar Five Laminectomies/Cellsaver  . Back surgery      2007 & 2013  . Cystoscopy with injection N/A 06/03/2013    Procedure: MACROPLASTIQUE  INJECTION ;  Surgeon: Malka So, MD;  Location: [facility];  Service: Urology;  Laterality: N/A;  . Cardiac catheterization  2003, 2005, 2009    ROS:  As stated in the HPI and negative for all other systems.  PHYSICAL EXAM BP 176/91  Pulse 81  Ht 5' (1.524 m)  Wt 222 lb (100.699 kg)  BMI 43.36 kg/m2 GENERAL:  Well appearing HEENT:  Pupils equal round and reactive, fundi not visualized, oral mucosa unremarkable NECK:  No jugular venous distention, waveform within normal limits, carotid upstroke brisk and symmetric, no bruits, no thyromegaly LYMPHATICS:  No cervical, inguinal adenopathy LUNGS:  Clear to auscultation bilaterally BACK:  No CVA tenderness CHEST:  Unremarkable HEART:  PMI not displaced or sustained,S1 and S2 within normal limits, no S3, no S4, no clicks, no rubs, no murmurs ABD:  Flat, positive bowel sounds normal in frequency in pitch, no bruits, no rebound, no guarding, no midline pulsatile mass, no hepatomegaly, no splenomegaly EXT:  2 plus pulses upper, decreased dorsalis pedis and posterior tibialis bilaterally, mild bilateral lower extremity edema, no cyanosis no clubbing SKIN:  No rashes no nodules, keloids   ASSESSMENT AND PLAN   CAD -  The patient has no new sypmtoms.  No further cardiovascular testing is indicated.  We will continue with aggressive risk reduction and meds as listed.  HYPERTENSION, BENIGN -  The blood pressure is elevated. However, this is unusual  and I will not make a change her medicines at this point.  DYSLIPIDEMIA - Per Anthoney Harada, MD  CHRONIC DIASTOLIC - I do think that she has some increased volume and she will take an extra 40 mg of Lasix for 2 days only. If she continues to have a sensation of swelling she will talk to her nephrologist who took her off the HCTZ to see if she can restart this. She says she felt better  on this but she understands there is an issue of renal insufficiency.  LEG PAIN - I suspect she is describing some sciatic pain. However, her pulses are reduced in her feet so I will check ABIs.

## 2014-03-19 ENCOUNTER — Encounter: Payer: Medicare Other | Admitting: Cardiology

## 2014-03-19 ENCOUNTER — Ambulatory Visit (INDEPENDENT_AMBULATORY_CARE_PROVIDER_SITE_OTHER): Payer: Medicare Other | Admitting: Cardiology

## 2014-03-19 DIAGNOSIS — I739 Peripheral vascular disease, unspecified: Secondary | ICD-10-CM

## 2014-03-19 DIAGNOSIS — R0989 Other specified symptoms and signs involving the circulatory and respiratory systems: Secondary | ICD-10-CM

## 2014-03-19 DIAGNOSIS — M79609 Pain in unspecified limb: Secondary | ICD-10-CM

## 2014-03-23 ENCOUNTER — Telehealth: Payer: Self-pay | Admitting: Cardiology

## 2014-03-23 NOTE — Telephone Encounter (Signed)
Pt aware of results of recent LEAs with ABI's.  Aware Dr Percival Spanish has not reviewed but once he does I will call her with orders.

## 2014-03-23 NOTE — Telephone Encounter (Signed)
Following up    Pt called for 4/16 test results. Pt's leg is very num and tingling.  Please give her a call.

## 2014-03-28 ENCOUNTER — Other Ambulatory Visit: Payer: Self-pay | Admitting: Family Medicine

## 2014-03-31 ENCOUNTER — Telehealth: Payer: Self-pay | Admitting: Cardiology

## 2014-03-31 NOTE — Telephone Encounter (Signed)
New message  ° ° °Patient calling for test results.   °

## 2014-04-02 NOTE — Telephone Encounter (Signed)
Pt was called with results.  Dr Hochrein's verbal order she does need a PV consult.  I responded to her MyChart questionaire and notified her that she will be called to schedule this appt. To treat her leg pain.

## 2014-04-10 NOTE — Progress Notes (Signed)
This encounter was created in error - please disregard.

## 2014-04-20 ENCOUNTER — Encounter (HOSPITAL_COMMUNITY): Payer: Self-pay | Admitting: Pharmacy Technician

## 2014-04-21 ENCOUNTER — Other Ambulatory Visit: Payer: Self-pay | Admitting: Urology

## 2014-04-21 ENCOUNTER — Ambulatory Visit (INDEPENDENT_AMBULATORY_CARE_PROVIDER_SITE_OTHER): Payer: Medicare Other | Admitting: Cardiovascular Disease

## 2014-04-21 ENCOUNTER — Encounter: Payer: Self-pay | Admitting: Cardiovascular Disease

## 2014-04-21 VITALS — BP 146/72 | HR 62 | Ht 60.0 in | Wt 214.8 lb

## 2014-04-21 DIAGNOSIS — N39498 Other specified urinary incontinence: Secondary | ICD-10-CM

## 2014-04-21 DIAGNOSIS — I739 Peripheral vascular disease, unspecified: Secondary | ICD-10-CM | POA: Diagnosis not present

## 2014-04-21 NOTE — Progress Notes (Signed)
PCP: Dr. Jacelyn Grip Primary cardiologist: Dr. Percival Spanish  HPI This is a 63 y/o female who was referred by Dr. Percival Spanish for evaluation of PAD.  She has known history of CAD and diastolic heart failure. She complains of bilateral pain in her thighs and calves both with walking and standing. She also has pain in her feet which she ascribes to neuropathy with burning and numbness.  She has prolonged history of diabetes with CKD. Most recent creatinine was 2.35. She is not a smoker. She had multiple back and neck surgeries.  Noninvasive evaluation revealed an ABI on 0.71 on right and 0.67 on left. There was significant proximal R SFA stenosis with short occlusion distally and significant L SFA disease.    Allergies  Allergen Reactions  . Ace Inhibitors     unknown  . Clopidogrel Bisulfate     REACTION: Sick, Headache, "Felt terrible"  . Codeine Other (See Comments)    REACTION: hallucinations  . Lisinopril Other (See Comments)    cough  . Penicillins     unknown    Current Outpatient Prescriptions  Medication Sig Dispense Refill  . albuterol (PROVENTIL HFA;VENTOLIN HFA) 108 (90 BASE) MCG/ACT inhaler Inhale 2 puffs into the lungs every 6 (six) hours as needed for wheezing.  18 g  2  . aspirin 81 MG EC tablet Take 81 mg by mouth daily.        [name] atenolol (TENORMIN) 50 MG tablet TAKE (1) TABLET TWICE DAILY.  60 tablet  5  . budesonide-formoterol (SYMBICORT) 160-4.5 MCG/ACT inhaler Inhale 2 puffs into the lungs 2 (two) times daily.  2 Inhaler  0  . cholecalciferol (VITAMIN D) 1000 UNITS tablet Take 1,000 Units by mouth every morning.      . furosemide (LASIX) 40 MG tablet TAKE 1 TABLET ONCE DAILY. TAKE AN EXTRA PILL IF WEIGHT IS ABOVE 3 LBS.  30 tablet  4  . gabapentin (NEURONTIN) 300 MG capsule TAKE 1 CAPSULE 3 TIMES DAILY.  90 capsule  0  . insulin detemir (LEVEMIR) 100 UNIT/ML injection Inject 60 Units into the skin at bedtime.       . insulin lispro (HUMALOG) 100 UNIT/ML injection Inject into  the skin See admin instructions. To be administered as directed per sliding scale: 90-150= 10  Units 151-200=11 units 201-250=12 units 251-300= 13 units  To call MD if levels are between 251-300 for three consecutive days      . linagliptin (TRADJENTA) 5 MG TABS tablet Take 5 mg by mouth daily.      [name] NIFEdipine (PROCARDIA XL/ADALAT-CC) 90 MG 24 hr tablet Take 90 mg by mouth every evening.       . potassium chloride (K-DUR,KLOR-CON) 10 MEQ tablet Take 1 tablet (10 mEq total) by mouth once.  2 tablet  0  . rosuvastatin (CRESTOR) 20 MG tablet Take 1 tablet (20 mg total) by mouth every evening.  30 tablet  11  . valsartan-hydrochlorothiazide (DIOVAN-HCT) 320-25 MG per tablet Take 1 tablet by mouth daily.       No current facility-administered medications for this visit.    Past Medical History  Diagnosis Date  . Esophageal stricture   . Hyperlipidemia   . Hypertension   . Bronchitis   . CAD (coronary artery disease)     s/p Stenting in 2005;  [place] 7/09: Vigorous LV function, 2-3+ MR, RI 40%, RI stent patent, proximal-mid RCA 40-50%;  [place] 7/09:  EF 55-65%, trivial MR;  Myoview 11/18/12: EF 66%, normal LV  wall motion, mild to moderate anterior ischemia - reviewed by [facility] and felt to rep breast atten (low risk);  Echo 6/14: mild LVH, EF 55-65%, Gr 2 DD, PASP 44, trivial eff   . Arthritis   . Non-insulin dependent diabetes mellitus   . Diverticulosis   . Diabetic neuropathy   . Vitamin D deficiency   . Degenerative joint disease     left shoulder  . Chronic neck pain   . Shortness of breath     at night  . Headache(784.0)   . Anemia     hx of  . Stroke     "mini- years ago"  . Chronic kidney disease     CREATININE IS UP--GOING TO KIDNEY MD   . CHF (congestive heart failure)     Past Surgical History  Procedure Laterality Date  . Bladder surgery      Tack  . Partial hysterectomy    . Shoulder surgery      Right  . Cervical spine surgery  2012    8 screws  . Abdominal  hysterectomy    . Laminectomy  12/29/2011  . Cardiac stents    . Lumbar laminectomy/decompression microdiscectomy  12/29/2011    Procedure: LUMBAR LAMINECTOMY/DECOMPRESSION MICRODISCECTOMY;  Surgeon: Floyce Stakes, MD;  Location: [facility];  Service: Neurosurgery;  Laterality: N/A;  Lumbar Two through Lumbar Five Laminectomies/Cellsaver  . Back surgery      2007 & 2013  . Cystoscopy with injection N/A 06/03/2013    Procedure: MACROPLASTIQUE  INJECTION ;  Surgeon: Malka So, MD;  Location: [facility];  Service: Urology;  Laterality: N/A;  . Cardiac catheterization  2003, 2005, 2009    ROS:  As stated in the HPI and negative for all other systems.  PHYSICAL EXAM There were no vitals taken for this visit. GENERAL:  Well appearing HEENT:  Pupils equal round and reactive, fundi not visualized, oral mucosa unremarkable NECK:  No jugular venous distention, waveform within normal limits, carotid upstroke brisk and symmetric, no bruits, no thyromegaly LYMPHATICS:  No cervical, inguinal adenopathy LUNGS:  Clear to auscultation bilaterally BACK:  No CVA tenderness CHEST:  Unremarkable HEART:  PMI not displaced or sustained,S1 and S2 within normal limits, no S3, no S4, no clicks, no rubs, no murmurs ABD:  Flat, positive bowel sounds normal in frequency in pitch, no bruits, no rebound, no guarding, no midline pulsatile mass, no hepatomegaly, no splenomegaly SKIN:  No rashes no nodules, keloids Vascular: radial pulse: +2, no carotid bruit, Femoral: +1, distal pulses not palpable.    ASSESSMENT AND PLAN

## 2014-04-21 NOTE — Patient Instructions (Addendum)
Your physician wants you to follow-up in: 1 YEAR with Dr Fletcher Anon.  You will receive a reminder letter in the mail two months in advance. If you don't receive a letter, please call our office to schedule the follow-up appointment.  Your physician recommends that you continue on your current medications as directed. Please refer to the Current Medication list given to you today.

## 2014-04-21 NOTE — Assessment & Plan Note (Signed)
The patient has evidence of PAD with moderately reduced ABI bilaterally with evidence of significant SFA disease bilaterally. This is likely responsible for bilateral calf pain with walking. However, this does not explain her thigh pain and symptoms while standing. She also seems to be have neuropathy.  Her symptoms are multifactorial. Due to that and given advanced CKD, I don't recommend invasive evaluation/revascularization.  Pletal is not recommended due to CHF. I adviced her to start an exercise program probably in the form of water aerobics.  Continue treatment of risk factors. Reserve angiography for worsening leg ischemia.

## 2014-04-23 ENCOUNTER — Ambulatory Visit (HOSPITAL_COMMUNITY): Payer: Medicare Other

## 2014-04-24 NOTE — Patient Instructions (Signed)
Your procedure is scheduled on:05/04/2014    Report to [facility] at   6:15   AM.  Call this number if you have problems the morning of surgery: [phone]   Remember:   Do not eat or drink :After Midnight.    Take these medicines the morning of surgery with A SIP OF WATER: Atenolol, Diovan and use Symbicort                  inhaler   Do not wear jewelry, make-up or nail polish.  Do not wear lotions, powders, or perfumes. You may wear deodorant.  Do not shave 48 hours prior to surgery.  Do not bring valuables to the hospital.  Contacts, dentures or bridgework may not be worn into surgery.  Patients discharged the day of surgery will not be allowed to drive home.  Name and phone number of your driver:    Please read over the following fact sheets that you were given: Pain Booklet, Surgical Site Infection Prevention, Anesthesia Post-op Instructions and Care and Recovery After Surgery  Cataract Surgery  A cataract is a clouding of the lens of the eye. When a lens becomes cloudy, vision is reduced based on the degree and nature of the clouding. Surgery may be needed to improve vision. Surgery removes the cloudy lens and usually replaces it with a substitute lens (intraocular lens, IOL). LET YOUR EYE DOCTOR KNOW ABOUT:  Allergies to food or medicine.   Medicines taken including herbs, eyedrops, over-the-counter medicines, and creams.   Use of steroids (by mouth or creams).   Previous problems with anesthetics or numbing medicine.   History of bleeding problems or blood clots.   Previous surgery.   Other health problems, including diabetes and kidney problems.   Possibility of pregnancy, if this applies.  RISKS AND COMPLICATIONS  Infection.   Inflammation of the eyeball (endophthalmitis) that can spread to both eyes (sympathetic ophthalmia).   Poor wound healing.   If an IOL is inserted, it can later fall out of proper position. This is very uncommon.   Clouding of the part  of your eye that holds an IOL in place. This is called an "after-cataract." These are uncommon, but easily treated.  BEFORE THE PROCEDURE  Do not eat or drink anything except small amounts of water for 8 to 12 before your surgery, or as directed by your caregiver.   Unless you are told otherwise, continue any eyedrops you have been prescribed.   Talk to your primary caregiver about all other medicines that you take (both prescription and non-prescription). In some cases, you may need to stop or change medicines near the time of your surgery. This is most important if you are taking blood-thinning medicine.Do not stop medicines unless you are told to do so.   Arrange for someone to drive you to and from the procedure.   Do not put contact lenses in either eye on the day of your surgery.  PROCEDURE There is more than one method for safely removing a cataract. Your doctor can explain the differences and help determine which is best for you. Phacoemulsification surgery is the most common form of cataract surgery.  An injection is given behind the eye or eyedrops are given to make this a painless procedure.   A small cut (incision) is made on the edge of the clear, dome-shaped surface that covers the front of the eye (cornea).   A tiny probe is painlessly inserted into the eye.  This device gives off ultrasound waves that soften and break up the cloudy center of the lens. This makes it easier for the cloudy lens to be removed by suction.   An IOL may be implanted.   The normal lens of the eye is covered by a clear capsule. Part of that capsule is intentionally left in the eye to support the IOL.   Your surgeon may or may not use stitches to close the incision.  There are other forms of cataract surgery that require a larger incision and stiches to close the eye. This approach is taken in cases where the doctor feels that the cataract cannot be easily removed using phacoemulsification. AFTER  THE PROCEDURE  When an IOL is implanted, it does not need care. It becomes a permanent part of your eye and cannot be seen or felt.   Your doctor will schedule follow-up exams to check on your progress.   Review your other medicines with your doctor to see which can be resumed after surgery.   Use eyedrops or take medicine as prescribed by your doctor.  Document Released: 11/09/2011 Document Reviewed: 11/06/2011 [facility] Patient Information 2012 [place].  .Cataract Surgery Care After Refer to this sheet in the next few weeks. These instructions provide you with information on caring for yourself after your procedure. Your caregiver may also give you more specific instructions. Your treatment has been planned according to current medical practices, but problems sometimes occur. Call your caregiver if you have any problems or questions after your procedure.  HOME CARE INSTRUCTIONS   Avoid strenuous activities as directed by your caregiver.   Ask your caregiver when you can resume driving.   Use eyedrops or other medicines to help healing and control pressure inside your eye as directed by your caregiver.   Only take over-the-counter or prescription medicines for pain, discomfort, or fever as directed by your caregiver.   Do not to touch or rub your eyes.   You may be instructed to use a protective shield during the first few days and nights after surgery. If not, wear sunglasses to protect your eyes. This is to protect the eye from pressure or from being accidentally bumped.   Keep the area around your eye clean and dry. Avoid swimming or allowing water to hit you directly in the face while showering. Keep soap and shampoo out of your eyes.   Do not bend or lift heavy objects. Bending increases pressure in the eye. You can walk, climb stairs, and do light household chores.   Do not put a contact lens into the eye that had surgery until your caregiver says it is okay to do so.     Ask your doctor when you can return to work. This will depend on the kind of work that you do. If you work in a dusty environment, you may be advised to wear protective eyewear for a period of time.   Ask your caregiver when it will be safe to engage in sexual activity.   Continue with your regular eye exams as directed by your caregiver.  What to expect:  It is normal to feel itching and mild discomfort for a few days after cataract surgery. Some fluid discharge is also common, and your eye may be sensitive to light and touch.   After 1 to 2 days, even moderate discomfort should disappear. In most cases, healing will take about 6 weeks.   If you received an intraocular lens (IOL), you  may notice that colors are very bright or have a blue tinge. Also, if you have been in bright sunlight, everything may appear reddish for a few hours. If you see these color tinges, it is because your lens is clear and no longer cloudy. Within a few months after receiving an IOL, these extra colors should go away. When you have healed, you will probably need new glasses.  SEEK MEDICAL CARE IF:   You have increased bruising around your eye.   You have discomfort not helped by medicine.  SEEK IMMEDIATE MEDICAL CARE IF:   You have a fever.   You have a worsening or sudden vision loss.   You have redness, swelling, or increasing pain in the eye.   You have a thick discharge from the eye that had surgery.  MAKE SURE YOU:  Understand these instructions.   Will watch your condition.   Will get help right away if you are not doing well or get worse.  Document Released: 06/09/2005 Document Revised: 11/09/2011 Document Reviewed: 07/14/2011 [facility] Patient Information 2012 [place].    Monitored Anesthesia Care  Monitored anesthesia care is an anesthesia service for a medical procedure. Anesthesia is the loss of the ability to feel pain. It is produced by medications called anesthetics. It may  affect a small area of your body (local anesthesia), a large area of your body (regional anesthesia), or your entire body (general anesthesia). The need for monitored anesthesia care depends your procedure, your condition, and the potential need for regional or general anesthesia. It is often provided during procedures where:   General anesthesia may be needed if there are complications. This is because you need special care when you are under general anesthesia.   You will be under local or regional anesthesia. This is so that you are able to have higher levels of anesthesia if needed.   You will receive calming medications (sedatives). This is especially the case if sedatives are given to put you in a semi-conscious state of relaxation (deep sedation). This is because the amount of sedative needed to produce this state can be hard to predict. Too much of a sedative can produce general anesthesia. Monitored anesthesia care is performed by one or more caregivers who have special training in all types of anesthesia. You will need to meet with these caregivers before your procedure. During this meeting, they will ask you about your medical history. They will also give you instructions to follow. (For example, you will need to stop eating and drinking before your procedure. You may also need to stop or change medications you are taking.) During your procedure, your caregivers will stay with you. They will:   Watch your condition. This includes watching you blood pressure, breathing, and level of pain.   Diagnose and treat problems that occur.   Give medications if they are needed. These may include calming medications (sedatives) and anesthetics.   Make sure you are comfortable.  Having monitored anesthesia care does not necessarily mean that you will be under anesthesia. It does mean that your caregivers will be able to manage anesthesia if you need it or if it occurs. It also means that you will  be able to have a different type of anesthesia than you are having if you need it. When your procedure is complete, your caregivers will continue to watch your condition. They will make sure any medications wear off before you are allowed to go home.  Document Released: 08/16/2005 Document  Revised: 03/17/2013 Document Reviewed: 01/01/2013 [facility] Patient Information 2014 [place], [place].

## 2014-04-28 ENCOUNTER — Encounter (HOSPITAL_COMMUNITY): Payer: Self-pay

## 2014-04-28 ENCOUNTER — Encounter (HOSPITAL_COMMUNITY)
Admission: RE | Admit: 2014-04-28 | Discharge: 2014-04-28 | Disposition: A | Discharge status: Home / Self Care | Payer: Medicare Other | Source: Ambulatory Visit | Attending: Ophthalmology | Admitting: Ophthalmology

## 2014-04-28 DIAGNOSIS — Z01812 Encounter for preprocedural laboratory examination: Secondary | ICD-10-CM | POA: Insufficient documentation

## 2014-04-28 LAB — BASIC METABOLIC PANEL
BUN: 36 mg/dL — ABNORMAL HIGH (ref 6–23)
CO2: 30 mEq/L (ref 19–32)
Calcium: 9.2 mg/dL (ref 8.4–10.5)
Chloride: 103 mEq/L (ref 96–112)
Creatinine, Ser: 2.66 mg/dL — ABNORMAL HIGH (ref 0.50–1.10)
GFR calc Af Amer: 21 mL/min — ABNORMAL LOW (ref >90)
GFR calc non Af Amer: 18 mL/min — ABNORMAL LOW (ref >90)
Glucose, Bld: 81 mg/dL (ref 70–99)
Potassium: 3.6 mEq/L — ABNORMAL LOW (ref 3.7–5.3)
Sodium: 146 mEq/L (ref 137–147)

## 2014-04-28 LAB — HEMOGLOBIN AND HEMATOCRIT, BLOOD
HCT: 37.6 % (ref 36.0–46.0)
Hemoglobin: 12.4 g/dL (ref 12.0–15.0)

## 2014-05-01 ENCOUNTER — Encounter: Payer: Self-pay | Admitting: Family

## 2014-05-01 ENCOUNTER — Ambulatory Visit (INDEPENDENT_AMBULATORY_CARE_PROVIDER_SITE_OTHER): Payer: Medicare Other | Admitting: Family

## 2014-05-01 VITALS — BP 150/77 | HR 71 | Temp 98.8°F | Ht 60.0 in | Wt 215.0 lb

## 2014-05-01 DIAGNOSIS — S161XXA Strain of muscle, fascia and tendon at neck level, initial encounter: Secondary | ICD-10-CM

## 2014-05-01 DIAGNOSIS — S139XXA Sprain of joints and ligaments of unspecified parts of neck, initial encounter: Secondary | ICD-10-CM

## 2014-05-01 MED ORDER — NAPROXEN 500 MG PO TABS: 500.0000 mg | ORAL_TABLET | Freq: Two times a day (BID) | ORAL | Status: DC

## 2014-05-01 MED ORDER — CYCLOBENZAPRINE HCL 5 MG PO TABS: 5.0000 mg | ORAL_TABLET | Freq: Three times a day (TID) | ORAL | Status: DC | PRN

## 2014-05-01 NOTE — Progress Notes (Signed)
   Subjective:    Patient ID: [name], female    DOB: Mar 04, 1951, 63 y.o.   MRN: [id number]  Neck Pain  This is a new problem. The current episode started 1 to 4 weeks ago. The problem occurs intermittently. The problem has been gradually worsening. The pain is associated with a sleep position. The pain is present in the left side. The quality of the pain is described as aching. The pain is at a severity of 4/10. The pain is mild. The symptoms are aggravated by position. The pain is worse during the night. Pertinent negatives include no fever, headaches, trouble swallowing or visual change. She has tried acetaminophen for the symptoms. The treatment provided mild relief.      Review of Systems  Constitutional: Negative for fever.  HENT: Negative for trouble swallowing.   Musculoskeletal: Positive for joint swelling and neck pain.  Neurological: Negative for headaches.  All other systems reviewed and are negative.      Objective:   Physical Exam  Vitals reviewed. Constitutional: She is oriented to person, place, and time. She appears well-developed and well-nourished. No distress.  HENT:  Head: Normocephalic and atraumatic.  Right Ear: External ear normal.  Mouth/Throat: Oropharynx is clear and moist.  Eyes: Pupils are equal, round, and reactive to light.  Neck: Normal range of motion. Neck supple. No thyromegaly present.  Slight decrease of ROM with twisting neck due to pain  Cardiovascular: Normal rate, regular rhythm, normal heart sounds and intact distal pulses.   No murmur heard. Pulmonary/Chest: Effort normal and breath sounds normal. No respiratory distress. She has no wheezes.  Abdominal: Soft. Bowel sounds are normal. She exhibits no distension. There is no tenderness.  Musculoskeletal: Normal range of motion. She exhibits tenderness (Mild tenderness on left neck/shoulder with palpation). She exhibits no edema.  Neurological: She is alert and oriented to person,  place, and time.  Skin: Skin is warm and dry.  Psychiatric: She has a normal mood and affect. Her behavior is normal. Judgment and thought content normal.    BP 150/77  Pulse 71  Temp(Src) 98.8 F (37.1 C) (Oral)  Ht 5' (1.524 m)  Wt 215 lb (97.523 kg)  BMI 41.99 kg/m2       Assessment & Plan:  1. Neck strain -Ice -Rest -Told to only take tylenol prn for pain bc of kidney function -Medication may cause drowsiness only taking at night - cyclobenzaprine (FLEXERIL) 5 MG tablet; Take 1 tablet (5 mg total) by mouth 3 (three) times daily as needed for muscle spasms.  Dispense: 30 tablet; Refill: 0  Evelina Dun, FNP

## 2014-05-01 NOTE — Patient Instructions (Signed)
Cervical Sprain A cervical sprain is an injury in the neck in which the strong, fibrous tissues (ligaments) that connect your neck bones stretch or tear. Cervical sprains can range from mild to severe. Severe cervical sprains can cause the neck vertebrae to be unstable. This can lead to damage of the spinal cord and can result in serious nervous system problems. The amount of time it takes for a cervical sprain to get better depends on the cause and extent of the injury. Most cervical sprains heal in 1 to 3 weeks. CAUSES  Severe cervical sprains may be caused by:   Contact sport injuries (such as from football, rugby, wrestling, hockey, auto racing, gymnastics, diving, martial arts, or boxing).   Motor vehicle collisions.   Whiplash injuries. This is an injury from a sudden forward-and backward whipping movement of the head and neck.  Falls.  Mild cervical sprains may be caused by:   Being in an awkward position, such as while cradling a telephone between your ear and shoulder.   Sitting in a chair that does not offer proper support.   Working at a poorly designed computer station.   Looking up or down for long periods of time.  SYMPTOMS   Pain, soreness, stiffness, or a burning sensation in the front, back, or sides of the neck. This discomfort may develop immediately after the injury or slowly, 24 hours or more after the injury.   Pain or tenderness directly in the middle of the back of the neck.   Shoulder or upper back pain.   Limited ability to move the neck.   Headache.   Dizziness.   Weakness, numbness, or tingling in the hands or arms.   Muscle spasms.   Difficulty swallowing or chewing.   Tenderness and swelling of the neck.  DIAGNOSIS  Most of the time your health care provider can diagnose a cervical sprain by taking your history and doing a physical exam. Your health care provider will ask about previous neck injuries and any known neck  problems, such as arthritis in the neck. X-rays may be taken to find out if there are any other problems, such as with the bones of the neck. Other tests, such as a CT scan or MRI, may also be needed.  TREATMENT  Treatment depends on the severity of the cervical sprain. Mild sprains can be treated with rest, keeping the neck in place (immobilization), and pain medicines. Severe cervical sprains are immediately immobilized. Further treatment is done to help with pain, muscle spasms, and other symptoms and may include:  Medicines, such as pain relievers, numbing medicines, or muscle relaxants.   Physical therapy. This may involve stretching exercises, strengthening exercises, and posture training. Exercises and improved posture can help stabilize the neck, strengthen muscles, and help stop symptoms from returning.  HOME CARE INSTRUCTIONS   Put ice on the injured area.   Put ice in a plastic bag.   Place a towel between your skin and the bag.   Leave the ice on for 15 20 minutes, 3 4 times a day.   If your injury was severe, you may have been given a cervical collar to wear. A cervical collar is a two-piece collar designed to keep your neck from moving while it heals.  Do not remove the collar unless instructed by your health care provider.  If you have long hair, keep it outside of the collar.  Ask your health care provider before making any adjustments to your collar.   Minor adjustments may be required over time to improve comfort and reduce pressure on your chin or on the back of your head.  Ifyou are allowed to remove the collar for cleaning or bathing, follow your health care provider's instructions on how to do so safely.  Keep your collar clean by wiping it with mild soap and water and drying it completely. If the collar you have been given includes removable pads, remove them every 1 2 days and hand wash them with soap and water. Allow them to air dry. They should be completely  dry before you wear them in the collar.  If you are allowed to remove the collar for cleaning and bathing, wash and dry the skin of your neck. Check your skin for irritation or sores. If you see any, tell your health care provider.  Do not drive while wearing the collar.   Only take over-the-counter or prescription medicines for pain, discomfort, or fever as directed by your health care provider.   Keep all follow-up appointments as directed by your health care provider.   Keep all physical therapy appointments as directed by your health care provider.   Make any needed adjustments to your workstation to promote good posture.   Avoid positions and activities that make your symptoms worse.   Warm up and stretch before being active to help prevent problems.  SEEK MEDICAL CARE IF:   Your pain is not controlled with medicine.   You are unable to decrease your pain medicine over time as planned.   Your activity level is not improving as expected.  SEEK IMMEDIATE MEDICAL CARE IF:   You develop any bleeding.  You develop stomach upset.  You have signs of an allergic reaction to your medicine.   Your symptoms get worse.   You develop new, unexplained symptoms.   You have numbness, tingling, weakness, or paralysis in any part of your body.  MAKE SURE YOU:   Understand these instructions.  Will watch your condition.  Will get help right away if you are not doing well or get worse. Document Released: 09/17/2007 Document Revised: 09/10/2013 Document Reviewed: 05/28/2013 [employer] Patient Information 2014 [employer].  

## 2014-05-04 ENCOUNTER — Ambulatory Visit (HOSPITAL_COMMUNITY): Payer: Medicare Other | Admitting: Anesthesiology

## 2014-05-04 ENCOUNTER — Encounter (HOSPITAL_COMMUNITY)
Admission: RE | Disposition: A | Discharge status: Home / Self Care | Payer: Self-pay | Source: Ambulatory Visit | Attending: Ophthalmology

## 2014-05-04 ENCOUNTER — Encounter (HOSPITAL_COMMUNITY): Payer: Self-pay | Admitting: Anesthesiology

## 2014-05-04 ENCOUNTER — Ambulatory Visit (HOSPITAL_COMMUNITY)
Admission: RE | Admit: 2014-05-04 | Discharge: 2014-05-04 | Disposition: A | Discharge status: Home / Self Care | Payer: Medicare Other | Source: Ambulatory Visit | Attending: Ophthalmology | Admitting: Ophthalmology

## 2014-05-04 ENCOUNTER — Encounter (HOSPITAL_COMMUNITY): Payer: Medicare Other | Admitting: Anesthesiology

## 2014-05-04 DIAGNOSIS — E1149 Type 2 diabetes mellitus with other diabetic neurological complication: Secondary | ICD-10-CM | POA: Insufficient documentation

## 2014-05-04 DIAGNOSIS — I10 Essential (primary) hypertension: Secondary | ICD-10-CM | POA: Insufficient documentation

## 2014-05-04 DIAGNOSIS — I739 Peripheral vascular disease, unspecified: Secondary | ICD-10-CM | POA: Insufficient documentation

## 2014-05-04 DIAGNOSIS — Z6841 Body Mass Index (BMI) 40.0 and over, adult: Secondary | ICD-10-CM | POA: Insufficient documentation

## 2014-05-04 DIAGNOSIS — I509 Heart failure, unspecified: Secondary | ICD-10-CM | POA: Insufficient documentation

## 2014-05-04 DIAGNOSIS — M1990 Unspecified osteoarthritis, unspecified site: Secondary | ICD-10-CM | POA: Insufficient documentation

## 2014-05-04 DIAGNOSIS — I2510 Atherosclerotic heart disease of native coronary artery without angina pectoris: Secondary | ICD-10-CM | POA: Insufficient documentation

## 2014-05-04 DIAGNOSIS — Z794 Long term (current) use of insulin: Secondary | ICD-10-CM | POA: Insufficient documentation

## 2014-05-04 DIAGNOSIS — Z9861 Coronary angioplasty status: Secondary | ICD-10-CM | POA: Insufficient documentation

## 2014-05-04 DIAGNOSIS — D649 Anemia, unspecified: Secondary | ICD-10-CM | POA: Insufficient documentation

## 2014-05-04 DIAGNOSIS — H2510 Age-related nuclear cataract, unspecified eye: Secondary | ICD-10-CM | POA: Insufficient documentation

## 2014-05-04 DIAGNOSIS — Z87891 Personal history of nicotine dependence: Secondary | ICD-10-CM | POA: Insufficient documentation

## 2014-05-04 DIAGNOSIS — N289 Disorder of kidney and ureter, unspecified: Secondary | ICD-10-CM | POA: Insufficient documentation

## 2014-05-04 DIAGNOSIS — E1142 Type 2 diabetes mellitus with diabetic polyneuropathy: Secondary | ICD-10-CM | POA: Insufficient documentation

## 2014-05-04 DIAGNOSIS — Z7982 Long term (current) use of aspirin: Secondary | ICD-10-CM | POA: Insufficient documentation

## 2014-05-04 DIAGNOSIS — Z79899 Other long term (current) drug therapy: Secondary | ICD-10-CM | POA: Insufficient documentation

## 2014-05-04 HISTORY — PX: CATARACT EXTRACTION W/PHACO: SHX586

## 2014-05-04 LAB — GLUCOSE, CAPILLARY: Glucose-Capillary: 154 mg/dL — ABNORMAL HIGH (ref 70–99)

## 2014-05-04 SURGERY — PHACOEMULSIFICATION, CATARACT, WITH IOL INSERTION
Anesthesia: Monitor Anesthesia Care | Site: Eye | Laterality: Right

## 2014-05-04 MED ORDER — FENTANYL CITRATE 0.05 MG/ML IJ SOLN
25.0000 ug | INTRAMUSCULAR | Status: AC
  Administered 2014-05-04 (×2): 25 ug via INTRAVENOUS

## 2014-05-04 MED ORDER — LIDOCAINE HCL 3.5 % OP GEL
OPHTHALMIC | Status: AC
  Filled 2014-05-04: qty 1

## 2014-05-04 MED ORDER — LIDOCAINE HCL 3.5 % OP GEL
OPHTHALMIC | Status: DC | PRN
  Administered 2014-05-04: 1 via OPHTHALMIC

## 2014-05-04 MED ORDER — POVIDONE-IODINE 5 % OP SOLN
OPHTHALMIC | Status: DC | PRN
Start: 2014-05-04 — End: 2014-05-04
  Administered 2014-05-04: 1 via OPHTHALMIC

## 2014-05-04 MED ORDER — CYCLOPENTOLATE-PHENYLEPHRINE 0.2-1 % OP SOLN
1.0000 [drp] | OPHTHALMIC | Status: AC | PRN
  Administered 2014-05-04 (×3): 1 [drp] via OPHTHALMIC

## 2014-05-04 MED ORDER — MIDAZOLAM HCL 2 MG/2ML IJ SOLN
INTRAMUSCULAR | Status: AC
  Filled 2014-05-04: qty 2

## 2014-05-04 MED ORDER — TETRACAINE HCL 0.5 % OP SOLN
1.0000 [drp] | OPHTHALMIC | Status: AC | PRN
  Administered 2014-05-04 (×3): 1 [drp] via OPHTHALMIC

## 2014-05-04 MED ORDER — FENTANYL CITRATE 0.05 MG/ML IJ SOLN
INTRAMUSCULAR | Status: DC | PRN
  Administered 2014-05-04 (×2): 50 ug via INTRAVENOUS

## 2014-05-04 MED ORDER — LACTATED RINGERS IV SOLN
INTRAVENOUS | Status: DC
  Administered 2014-05-04: 07:00:00 via INTRAVENOUS

## 2014-05-04 MED ORDER — NA HYALUR & NA CHOND-NA HYALUR 0.55-0.5 ML IO KIT
PACK | INTRAOCULAR | Status: DC | PRN
  Administered 2014-05-04: 1 via OPHTHALMIC

## 2014-05-04 MED ORDER — ONDANSETRON HCL 4 MG/2ML IJ SOLN: 4.0000 mg | Freq: Once | INTRAMUSCULAR | Status: DC | PRN

## 2014-05-04 MED ORDER — EPINEPHRINE HCL 1 MG/ML IJ SOLN
INTRAMUSCULAR | Status: AC
  Filled 2014-05-04: qty 1

## 2014-05-04 MED ORDER — FENTANYL CITRATE 0.05 MG/ML IJ SOLN: 25.0000 ug | INTRAMUSCULAR | Status: DC | PRN

## 2014-05-04 MED ORDER — CYCLOPENTOLATE-PHENYLEPHRINE OP SOLN OPTIME - NO CHARGE
OPHTHALMIC | Status: AC
  Filled 2014-05-04: qty 2

## 2014-05-04 MED ORDER — FENTANYL CITRATE 0.05 MG/ML IJ SOLN
INTRAMUSCULAR | Status: AC
  Filled 2014-05-04: qty 2

## 2014-05-04 MED ORDER — BSS IO SOLN
INTRAOCULAR | Status: DC | PRN
  Administered 2014-05-04: 15 mL via INTRAOCULAR

## 2014-05-04 MED ORDER — TETRACAINE 0.5 % OP SOLN OPTIME - NO CHARGE
OPHTHALMIC | Status: DC | PRN
  Administered 2014-05-04: 1 [drp] via OPHTHALMIC

## 2014-05-04 MED ORDER — MIDAZOLAM HCL 2 MG/2ML IJ SOLN
1.0000 mg | INTRAMUSCULAR | Status: DC | PRN
  Administered 2014-05-04 (×2): 1 mg via INTRAVENOUS

## 2014-05-04 MED ORDER — EPINEPHRINE HCL 1 MG/ML IJ SOLN
INTRAOCULAR | Status: DC | PRN
  Administered 2014-05-04: 08:00:00

## 2014-05-04 MED ORDER — TETRACAINE HCL 0.5 % OP SOLN
OPHTHALMIC | Status: AC
  Filled 2014-05-04: qty 2

## 2014-05-04 MED ORDER — LACTATED RINGERS IV SOLN
INTRAVENOUS | Status: DC | PRN
  Administered 2014-05-04: 07:00:00 via INTRAVENOUS

## 2014-05-04 SURGICAL SUPPLY — 27 items
CAPSULAR TENSION RING-AMO (OPHTHALMIC RELATED) IMPLANT
CLOTH BEACON ORANGE TIMEOUT ST (SAFETY) ×1 IMPLANT
GLOVE BIO SURGEON STRL SZ7.5 (GLOVE) IMPLANT
GLOVE BIOGEL M 6.5 STRL (GLOVE) IMPLANT
GLOVE BIOGEL PI IND STRL 6.5 (GLOVE) IMPLANT
GLOVE BIOGEL PI IND STRL 7.0 (GLOVE) IMPLANT
GLOVE BIOGEL PI INDICATOR 6.5 (GLOVE)
GLOVE BIOGEL PI INDICATOR 7.0 (GLOVE) ×2
GLOVE ECLIPSE 6.5 STRL STRAW (GLOVE) IMPLANT
GLOVE ECLIPSE 7.5 STRL STRAW (GLOVE) IMPLANT
GLOVE EXAM NITRILE LRG STRL (GLOVE) IMPLANT
GLOVE EXAM NITRILE MD LF STRL (GLOVE) IMPLANT
GLOVE SKINSENSE NS SZ6.5 (GLOVE)
GLOVE SKINSENSE NS SZ7.0 (GLOVE)
GLOVE SKINSENSE STRL SZ6.5 (GLOVE) IMPLANT
GLOVE SKINSENSE STRL SZ7.0 (GLOVE) IMPLANT
INST SET CATARACT ~~LOC~~ (KITS) ×2 IMPLANT
KIT VITRECTOMY (OPHTHALMIC RELATED) IMPLANT
PAD ARMBOARD 7.5X6 YLW CONV (MISCELLANEOUS) ×1 IMPLANT
PROC W NO LENS (INTRAOCULAR LENS)
PROC W SPEC LENS (INTRAOCULAR LENS)
PROCESS W NO LENS (INTRAOCULAR LENS) IMPLANT
PROCESS W SPEC LENS (INTRAOCULAR LENS) IMPLANT
RING MALYGIN (MISCELLANEOUS) IMPLANT
SIGHTPATH CAT PROC W REG LENS (Ophthalmic Related) ×2 IMPLANT
VISCOELASTIC ADDITIONAL (OPHTHALMIC RELATED) IMPLANT
WATER STERILE IRR 250ML POUR (IV SOLUTION) ×1 IMPLANT

## 2014-05-04 NOTE — Anesthesia Procedure Notes (Signed)
Procedure Name: MAC Date/Time: 05/04/2014 7:29 AM Performed by: Andree Elk, AMY A Pre-anesthesia Checklist: Patient identified, Timeout performed, Emergency Drugs available, Suction available and Patient being monitored Oxygen Delivery Method: Nasal cannula

## 2014-05-04 NOTE — Anesthesia Preprocedure Evaluation (Signed)
Anesthesia Evaluation  Patient identified by MRN, date of birth, ID band Patient awake    Reviewed: Allergy & Precautions, H&P , NPO status , Patient's Chart, lab work & pertinent test results, reviewed documented beta blocker date and time   Airway Mallampati: I TM Distance: >3 FB Neck ROM: Full    Dental  (+) Edentulous Upper, Poor Dentition   Pulmonary shortness of breath, former smoker,  breath sounds clear to auscultation        Cardiovascular hypertension, Pt. on medications and Pt. 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 Pt states that she is supposed to start seeing a kidney doctor.      Musculoskeletal  (+) Arthritis -, Osteoarthritis,    Abdominal (+) + obese,   Peds  Hematology negative hematology ROS (+) anemia ,   Anesthesia Other Findings   Reproductive/Obstetrics negative OB ROS                           Anesthesia Physical Anesthesia Plan  ASA: III  Anesthesia Plan: MAC   Post-op Pain Management:    Induction: Intravenous  Airway Management Planned: Nasal Cannula  Additional Equipment:   Intra-op Plan:   Post-operative Plan:   Informed Consent: I have reviewed the patients History and Physical, chart, labs and discussed the procedure including the risks, benefits and alternatives for the proposed anesthesia with the patient or authorized representative who has indicated his/her understanding and acceptance.     Plan Discussed with:   Anesthesia Plan Comments:          Anesthesia Quick Evaluation

## 2014-05-04 NOTE — Transfer of Care (Signed)
Immediate Anesthesia Transfer of Care Note  Patient: [name]  Procedure(s) Performed: Procedure(s) with comments: CATARACT EXTRACTION PHACO AND INTRAOCULAR LENS PLACEMENT (IOC) (Right) - CDE 1.47  Patient Location: Short Stay  Anesthesia Type:MAC  Level of Consciousness: awake, alert , oriented and patient cooperative  Airway & Oxygen Therapy: Patient Spontanous Breathing  Post-op Assessment: Report given to PACU RN and Post -op Vital signs reviewed and stable  Post vital signs: Reviewed and stable  Complications: No apparent anesthesia complications

## 2014-05-04 NOTE — H&P (Signed)
I have reviewed the pre printed H&P, the patient was re-examined, and I have identified no significant interval changes in the patient's medical condition.  There is no change in the plan of care since the history and physical of record. 

## 2014-05-04 NOTE — Anesthesia Postprocedure Evaluation (Signed)
  Anesthesia Post-op Note  Patient: [name]  Procedure(s) Performed: Procedure(s) with comments: CATARACT EXTRACTION PHACO AND INTRAOCULAR LENS PLACEMENT (IOC) (Right) - CDE 1.47  Patient Location: Short Stay  Anesthesia Type:MAC  Level of Consciousness: awake, alert , oriented and patient cooperative  Airway and Oxygen Therapy: Patient Spontanous Breathing  Post-op Pain: mild  Post-op Assessment: Post-op Vital signs reviewed, Patient's Cardiovascular Status Stable, Respiratory Function Stable, Patent Airway, No signs of Nausea or vomiting and Pain level controlled  Post-op Vital Signs: Reviewed and stable  Last Vitals:  Filed Vitals:   05/04/14 0730  BP: 135/70  Pulse:   Temp:   Resp: 20    Complications: No apparent anesthesia complications

## 2014-05-04 NOTE — Discharge Instructions (Signed)
NOEL BROOKING 05/04/2014 Dr. Iona Hansen Post operative Instructions for Cataract Patients  These instructions are for [name] and pertain to the operative eye.  1.  Resume your normal diet and previous oral medicines.  2. Your Follow-up appointment is at Dr. Iona Hansen' office in [place] on 05/05/2014 at 1:45 pm.  3. You may leave the hospital when your driver is present and your nurse releases you.  4. Begin Pred Forte (prednisolone acetate 1%), Acular LS (ketorolac tromethamine .4%) and Gatifloxacin 0.5% eye drops; 1 drop each 4 times daily to operative eye. Begin 3 hours after discharge from Short Stay Unit.  Moxifloxacin 0.5% may be substituted for Gatifloxacin using the same instructions.  62. Page Dr. Iona Hansen via beeper [phone] for significant pain in or around operative eye that is not relieved by Tylenol.  6. If you took Plavix before surgery, restart it at the usual dose on the evening of surgery.  7. Wear dark glasses as necessary for excessive light sensitivity.  8. Do no forcefully rub you your operative eye.  9. Keep your operative eye dry for 1 week. You may gently clean your eyelids with a damp washcloth.  10. You may resume normal occupational activities in one week and resume driving as tolerated after the first post operative visit.  11. It is normal to have blurred vision and a scratchy sensation following surgery.  Dr. Iona Hansen: [phone]

## 2014-05-04 NOTE — Op Note (Signed)
See scanned op note 

## 2014-05-04 NOTE — Brief Op Note (Signed)
05/04/2014  8:10 AM  PATIENT:  [name]  63 y.o. female  PRE-OPERATIVE DIAGNOSIS:  nuclear cataract right eye  POST-OPERATIVE DIAGNOSIS:  nuclear cataract right eye  PROCEDURE:  Procedure(s): CATARACT EXTRACTION PHACO AND INTRAOCULAR LENS PLACEMENT (IOC)  SURGEON:  Surgeon(s): Williams Che, MD  ASSISTANTS: Cleda Clarks, CST   ANESTHESIA STAFF: Anesthesiologist: Lerry Liner, MD CRNA: Mickel Baas, CRNA  ANESTHESIA:   topical and MAC  REQUESTED LENS POWER: 18.0  LENS IMPLANT INFORMATION: Alcon [id number]  S/n [id number]  Exp 10/2016  CUMULATIVE DISSIPATED ENERGY:1.47  INDICATIONS:  See office H&P  OP FINDINGS:moderately dense NS and cortical cat  COMPLICATIONS:  None  DICTATION #: none  PLAN OF CARE: as above  PATIENT DISPOSITION:  Short Stay

## 2014-05-05 ENCOUNTER — Encounter (HOSPITAL_COMMUNITY): Payer: Self-pay | Admitting: Ophthalmology

## 2014-05-05 ENCOUNTER — Other Ambulatory Visit (HOSPITAL_COMMUNITY): Payer: Medicare Other

## 2014-05-12 ENCOUNTER — Ambulatory Visit (HOSPITAL_COMMUNITY)
Admission: RE | Admit: 2014-05-12 | Discharge: 2014-05-12 | Disposition: A | Discharge status: Home / Self Care | Payer: Medicare Other | Source: Ambulatory Visit | Attending: Urology | Admitting: Urology

## 2014-05-12 DIAGNOSIS — Q619 Cystic kidney disease, unspecified: Secondary | ICD-10-CM | POA: Insufficient documentation

## 2014-05-12 DIAGNOSIS — N39498 Other specified urinary incontinence: Secondary | ICD-10-CM

## 2014-06-04 ENCOUNTER — Telehealth: Payer: Self-pay | Admitting: *Deleted

## 2014-06-04 NOTE — Telephone Encounter (Signed)
Pt sees Dr Dorris Fetch for diabetes

## 2014-06-24 ENCOUNTER — Other Ambulatory Visit: Payer: Self-pay

## 2014-06-24 DIAGNOSIS — E785 Hyperlipidemia, unspecified: Secondary | ICD-10-CM

## 2014-06-24 MED ORDER — GABAPENTIN 300 MG PO CAPS: ORAL_CAPSULE | ORAL | Status: DC

## 2014-06-24 NOTE — Telephone Encounter (Signed)
Last lipid 01/26/14  Christy

## 2014-06-29 MED ORDER — ROSUVASTATIN CALCIUM 20 MG PO TABS: 20.0000 mg | ORAL_TABLET | Freq: Every evening | ORAL | Status: DC

## 2014-07-10 ENCOUNTER — Encounter (HOSPITAL_COMMUNITY): Payer: Self-pay

## 2014-07-14 ENCOUNTER — Encounter (HOSPITAL_COMMUNITY): Payer: Self-pay

## 2014-07-14 ENCOUNTER — Encounter (HOSPITAL_COMMUNITY)
Admission: RE | Admit: 2014-07-14 | Discharge: 2014-07-14 | Disposition: A | Discharge status: Home / Self Care | Payer: Medicare Other | Source: Ambulatory Visit | Attending: Ophthalmology | Admitting: Ophthalmology

## 2014-07-14 DIAGNOSIS — H269 Unspecified cataract: Secondary | ICD-10-CM | POA: Insufficient documentation

## 2014-07-14 DIAGNOSIS — Z01818 Encounter for other preprocedural examination: Secondary | ICD-10-CM | POA: Diagnosis present

## 2014-07-14 NOTE — Patient Instructions (Signed)
Your procedure is scheduled on: 07/20/2014  Report to [facility] at  800 AM.  Call this number if you have problems the morning of surgery: [phone]   Do not eat food or drink liquids :After Midnight.      Take these medicines the morning of surgery with A SIP OF WATER: procardia, atenolol, flexaril, neurontin, diovan   Do not wear jewelry, make-up or nail polish.  Do not wear lotions, powders, or perfumes.   Do not shave 48 hours prior to surgery.  Do not bring valuables to the hospital.  Contacts, dentures or bridgework may not be worn into surgery.  Leave suitcase in the car. After surgery it may be brought to your room.  For patients admitted to the hospital, checkout time is 11:00 AM the day of discharge.   Patients discharged the day of surgery will not be allowed to drive home.  :     Please read over the following fact sheets that you were given: Coughing and Deep Breathing, Surgical Site Infection Prevention, Anesthesia Post-op Instructions and Care and Recovery After Surgery    Cataract A cataract is a clouding of the lens of the eye. When a lens becomes cloudy, vision is reduced based on the degree and nature of the clouding. Many cataracts reduce vision to some degree. Some cataracts make people more near-sighted as they develop. Other cataracts increase glare. Cataracts that are ignored and become worse can sometimes look white. The white color can be seen through the pupil. CAUSES   Aging. However, cataracts may occur at any age, even in newborns.   Certain drugs.   Trauma to the eye.   Certain diseases such as diabetes.   Specific eye diseases such as chronic inflammation inside the eye or a sudden attack of a rare form of glaucoma.   Inherited or acquired medical problems.  SYMPTOMS   Gradual, progressive drop in vision in the affected eye.   Severe, rapid visual loss. This most often happens when trauma is the cause.  DIAGNOSIS  To detect a cataract, an  eye doctor examines the lens. Cataracts are best diagnosed with an exam of the eyes with the pupils enlarged (dilated) by drops.  TREATMENT  For an early cataract, vision may improve by using different eyeglasses or stronger lighting. If that does not help your vision, surgery is the only effective treatment. A cataract needs to be surgically removed when vision loss interferes with your everyday activities, such as driving, reading, or watching TV. A cataract may also have to be removed if it prevents examination or treatment of another eye problem. Surgery removes the cloudy lens and usually replaces it with a substitute lens (intraocular lens, IOL).  At a time when both you and your doctor agree, the cataract will be surgically removed. If you have cataracts in both eyes, only one is usually removed at a time. This allows the operated eye to heal and be out of danger from any possible problems after surgery (such as infection or poor wound healing). In rare cases, a cataract may be doing damage to your eye. In these cases, your caregiver may advise surgical removal right away. The vast majority of people who have cataract surgery have better vision afterward. HOME CARE INSTRUCTIONS  If you are not planning surgery, you may be asked to do the following:  Use different eyeglasses.   Use stronger or brighter lighting.   Ask your eye doctor about reducing your medicine dose or  changing medicines if it is thought that a medicine caused your cataract. Changing medicines does not make the cataract go away on its own.   Become familiar with your surroundings. Poor vision can lead to injury. Avoid bumping into things on the affected side. You are at a higher risk for tripping or falling.   Exercise extreme care when driving or operating machinery.   Wear sunglasses if you are sensitive to bright light or experiencing problems with glare.  SEEK IMMEDIATE MEDICAL CARE IF:   You have a worsening or sudden  vision loss.   You notice redness, swelling, or increasing pain in the eye.   You have a fever.  Document Released: 11/20/2005 Document Revised: 11/09/2011 Document Reviewed: 07/14/2011 [employer] Patient Information 2012 [place].PATIENT INSTRUCTIONS POST-ANESTHESIA  IMMEDIATELY FOLLOWING SURGERY:  Do not drive or operate machinery for the first twenty four hours after surgery.  Do not make any important decisions for twenty four hours after surgery or while taking narcotic pain medications or sedatives.  If you develop intractable nausea and vomiting or a severe headache please notify your doctor immediately.  FOLLOW-UP:  Please make an appointment with your surgeon as instructed. You do not need to follow up with anesthesia unless specifically instructed to do so.  WOUND CARE INSTRUCTIONS (if applicable):  Keep a dry clean dressing on the anesthesia/puncture wound site if there is drainage.  Once the wound has quit draining you may leave it open to air.  Generally you should leave the bandage intact for twenty four hours unless there is drainage.  If the epidural site drains for more than 36-48 hours please call the anesthesia department.  QUESTIONS?:  Please feel free to call your physician or the hospital operator if you have any questions, and they will be happy to assist you.

## 2014-07-20 ENCOUNTER — Encounter (HOSPITAL_COMMUNITY): Payer: Self-pay | Admitting: Anesthesiology

## 2014-07-20 ENCOUNTER — Encounter (HOSPITAL_COMMUNITY): Payer: Medicare Other | Admitting: Anesthesiology

## 2014-07-20 ENCOUNTER — Encounter (HOSPITAL_COMMUNITY)
Admission: RE | Disposition: A | Discharge status: Home / Self Care | Payer: Self-pay | Source: Ambulatory Visit | Attending: Ophthalmology

## 2014-07-20 ENCOUNTER — Ambulatory Visit (HOSPITAL_COMMUNITY): Payer: Medicare Other | Admitting: Anesthesiology

## 2014-07-20 ENCOUNTER — Ambulatory Visit (HOSPITAL_COMMUNITY)
Admission: RE | Admit: 2014-07-20 | Discharge: 2014-07-20 | Disposition: A | Discharge status: Home / Self Care | Payer: Medicare Other | Source: Ambulatory Visit | Attending: Ophthalmology | Admitting: Ophthalmology

## 2014-07-20 DIAGNOSIS — I10 Essential (primary) hypertension: Secondary | ICD-10-CM | POA: Insufficient documentation

## 2014-07-20 DIAGNOSIS — Z7982 Long term (current) use of aspirin: Secondary | ICD-10-CM | POA: Diagnosis not present

## 2014-07-20 DIAGNOSIS — H269 Unspecified cataract: Secondary | ICD-10-CM | POA: Diagnosis present

## 2014-07-20 DIAGNOSIS — E1142 Type 2 diabetes mellitus with diabetic polyneuropathy: Secondary | ICD-10-CM | POA: Insufficient documentation

## 2014-07-20 DIAGNOSIS — E1149 Type 2 diabetes mellitus with other diabetic neurological complication: Secondary | ICD-10-CM | POA: Diagnosis not present

## 2014-07-20 DIAGNOSIS — Z87891 Personal history of nicotine dependence: Secondary | ICD-10-CM | POA: Diagnosis not present

## 2014-07-20 DIAGNOSIS — Z794 Long term (current) use of insulin: Secondary | ICD-10-CM | POA: Diagnosis not present

## 2014-07-20 DIAGNOSIS — N289 Disorder of kidney and ureter, unspecified: Secondary | ICD-10-CM | POA: Diagnosis not present

## 2014-07-20 HISTORY — PX: CATARACT EXTRACTION W/PHACO: SHX586

## 2014-07-20 LAB — GLUCOSE, CAPILLARY: Glucose-Capillary: 171 mg/dL — ABNORMAL HIGH (ref 70–99)

## 2014-07-20 SURGERY — PHACOEMULSIFICATION, CATARACT, WITH IOL INSERTION
Anesthesia: Monitor Anesthesia Care | Laterality: Left

## 2014-07-20 MED ORDER — LIDOCAINE HCL 3.5 % OP GEL
OPHTHALMIC | Status: DC | PRN
  Administered 2014-07-20: 1 via OPHTHALMIC

## 2014-07-20 MED ORDER — MIDAZOLAM HCL 5 MG/5ML IJ SOLN
INTRAMUSCULAR | Status: DC | PRN
  Administered 2014-07-20: 2 mg via INTRAVENOUS

## 2014-07-20 MED ORDER — CYCLOPENTOLATE-PHENYLEPHRINE OP SOLN OPTIME - NO CHARGE
OPHTHALMIC | Status: AC
  Filled 2014-07-20: qty 2

## 2014-07-20 MED ORDER — MIDAZOLAM HCL 2 MG/2ML IJ SOLN
INTRAMUSCULAR | Status: AC
  Filled 2014-07-20: qty 2

## 2014-07-20 MED ORDER — LACTATED RINGERS IV SOLN
INTRAVENOUS | Status: DC
  Administered 2014-07-20: 1000 mL via INTRAVENOUS

## 2014-07-20 MED ORDER — TETRACAINE 0.5 % OP SOLN OPTIME - NO CHARGE
OPHTHALMIC | Status: DC | PRN
  Administered 2014-07-20: 1 [drp] via OPHTHALMIC

## 2014-07-20 MED ORDER — FENTANYL CITRATE 0.05 MG/ML IJ SOLN
INTRAMUSCULAR | Status: AC
  Filled 2014-07-20: qty 2

## 2014-07-20 MED ORDER — FENTANYL CITRATE 0.05 MG/ML IJ SOLN
25.0000 ug | INTRAMUSCULAR | Status: AC
  Administered 2014-07-20 (×2): 25 ug via INTRAVENOUS

## 2014-07-20 MED ORDER — POVIDONE-IODINE 5 % OP SOLN
OPHTHALMIC | Status: DC | PRN
  Administered 2014-07-20: 1 via OPHTHALMIC

## 2014-07-20 MED ORDER — EPINEPHRINE HCL 1 MG/ML IJ SOLN
INTRAOCULAR | Status: DC | PRN
  Administered 2014-07-20: 09:00:00

## 2014-07-20 MED ORDER — MIDAZOLAM HCL 2 MG/2ML IJ SOLN
1.0000 mg | INTRAMUSCULAR | Status: DC | PRN
  Administered 2014-07-20: 2 mg via INTRAVENOUS

## 2014-07-20 MED ORDER — LIDOCAINE HCL 3.5 % OP GEL
1.0000 "application " | Freq: Once | OPHTHALMIC | Status: AC
  Administered 2014-07-20: 1 via OPHTHALMIC

## 2014-07-20 MED ORDER — EPINEPHRINE HCL 1 MG/ML IJ SOLN
INTRAMUSCULAR | Status: AC
  Filled 2014-07-20: qty 1

## 2014-07-20 MED ORDER — CYCLOPENTOLATE-PHENYLEPHRINE 0.2-1 % OP SOLN
1.0000 [drp] | OPHTHALMIC | Status: AC
  Administered 2014-07-20 (×3): 1 [drp] via OPHTHALMIC

## 2014-07-20 MED ORDER — TETRACAINE HCL 0.5 % OP SOLN
1.0000 [drp] | OPHTHALMIC | Status: AC
  Administered 2014-07-20 (×3): 1 [drp] via OPHTHALMIC
  Filled 2014-07-20: qty 2

## 2014-07-20 MED ORDER — TETRACAINE HCL 0.5 % OP SOLN
OPHTHALMIC | Status: AC
  Filled 2014-07-20: qty 2

## 2014-07-20 MED ORDER — BSS IO SOLN
INTRAOCULAR | Status: DC | PRN
  Administered 2014-07-20: 15 mL

## 2014-07-20 MED ORDER — NA HYALUR & NA CHOND-NA HYALUR 0.55-0.5 ML IO KIT
PACK | INTRAOCULAR | Status: DC | PRN
  Administered 2014-07-20: 1 via OPHTHALMIC

## 2014-07-20 MED ORDER — LIDOCAINE HCL 3.5 % OP GEL
OPHTHALMIC | Status: AC
Start: 2014-07-20 — End: 2014-07-20
  Filled 2014-07-20: qty 1

## 2014-07-20 SURGICAL SUPPLY — 10 items
CLOTH BEACON ORANGE TIMEOUT ST (SAFETY) ×1 IMPLANT
GLOVE BIOGEL PI IND STRL 6.5 (GLOVE) IMPLANT
GLOVE BIOGEL PI IND STRL 7.0 (GLOVE) IMPLANT
GLOVE BIOGEL PI INDICATOR 6.5 (GLOVE) ×1
GLOVE BIOGEL PI INDICATOR 7.0 (GLOVE) ×1
INST SET CATARACT ~~LOC~~ (KITS) ×2 IMPLANT
PAD ARMBOARD 7.5X6 YLW CONV (MISCELLANEOUS) ×1 IMPLANT
RETRACTOR IRIS SIGHTPATH (OPHTHALMIC RELATED) ×2 IMPLANT
SIGHTPATH CAT PROC W REG LENS (Ophthalmic Related) ×2 IMPLANT
WATER STERILE IRR 250ML POUR (IV SOLUTION) ×1 IMPLANT

## 2014-07-20 NOTE — Op Note (Signed)
See scanned op note 

## 2014-07-20 NOTE — H&P (Signed)
I have reviewed the pre printed H&P, the patient was re-examined, and I have identified no significant interval changes in the patient's medical condition.  There is no change in the plan of care since the history and physical of record. 

## 2014-07-20 NOTE — Progress Notes (Signed)
Pt states she has no left arm restrictions.

## 2014-07-20 NOTE — Transfer of Care (Signed)
Immediate Anesthesia Transfer of Care Note  Patient: [name]  Procedure(s) Performed: Procedure(s): CATARACT EXTRACTION WITH PHACO AND INTRAOCULAR LENS PLACEMENT LEFT EYE CDE=1.79 (Left)  Patient Location: Short Stay  Anesthesia Type:MAC  Level of Consciousness: awake  Airway & Oxygen Therapy: Patient Spontanous Breathing  Post-op Assessment: Report given to PACU RN  Post vital signs: Reviewed  Complications: No apparent anesthesia complications

## 2014-07-20 NOTE — Anesthesia Preprocedure Evaluation (Signed)
Anesthesia Evaluation  Patient identified by MRN, date of birth, ID band Patient awake    Reviewed: Allergy & Precautions, H&P , NPO status , Patient's Chart, lab work & pertinent test results, reviewed documented beta blocker date and time   Airway Mallampati: I TM Distance: >3 FB Neck ROM: Full    Dental  (+) Edentulous Upper, Poor Dentition   Pulmonary shortness of breath, former smoker,  breath sounds clear to auscultation        Cardiovascular hypertension, Pt. on medications and Pt. 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 Pt states that she is supposed to start seeing a kidney doctor.      Musculoskeletal  (+) Arthritis -, Osteoarthritis,    Abdominal (+) + obese,   Peds  Hematology negative hematology ROS (+) anemia ,   Anesthesia Other Findings   Reproductive/Obstetrics negative OB ROS                           Anesthesia Physical Anesthesia Plan  ASA: III  Anesthesia Plan: MAC   Post-op Pain Management:    Induction: Intravenous  Airway Management Planned: Nasal Cannula  Additional Equipment:   Intra-op Plan:   Post-operative Plan:   Informed Consent: I have reviewed the patients History and Physical, chart, labs and discussed the procedure including the risks, benefits and alternatives for the proposed anesthesia with the patient or authorized representative who has indicated his/her understanding and acceptance.     Plan Discussed with:   Anesthesia Plan Comments:          Anesthesia Quick Evaluation

## 2014-07-20 NOTE — Brief Op Note (Signed)
07/20/2014  11:23 AM  PATIENT:  [name]  63 y.o. female  PRE-OPERATIVE DIAGNOSIS:  Cataract left eye; difficulty performing daily activities  POST-OPERATIVE DIAGNOSIS:  Cataract Left Eye; difficulty performing daily activities  PROCEDURE:  Procedure(s): CATARACT EXTRACTION WITH PHACO AND INTRAOCULAR LENS PLACEMENT LEFT EYE CDE=1.79  SURGEON:  Surgeon(s): Williams Che, MD  ASSISTANTS: Zoila Shutter, CST   ANESTHESIA STAFF: Anesthesiologist: Lerry Liner, MD CRNA: Ollen Bowl, CRNA  ANESTHESIA:   topical and MAC  REQUESTED LENS POWER: 19.5  LENS IMPLANT INFORMATION:   Alcon [id number]  S/n [id number]   Exp 10/2016  CUMULATIVE DISSIPATED ENERGY:1.79  INDICATIONS:see preop office H&P  OP FINDINGS:mod dense NS  COMPLICATIONS:None  DICTATION #: see scanned op note  PLAN OF CARE: as above  PATIENT DISPOSITION:  Short Stay

## 2014-07-20 NOTE — Anesthesia Postprocedure Evaluation (Signed)
  Anesthesia Post-op Note  Patient: [name]  Procedure(s) Performed: Procedure(s): CATARACT EXTRACTION WITH PHACO AND INTRAOCULAR LENS PLACEMENT LEFT EYE CDE=1.79 (Left)  Patient Location: Short Stay  Anesthesia Type:MAC  Level of Consciousness: awake, alert  and oriented  Airway and Oxygen Therapy: Patient Spontanous Breathing  Post-op Pain: mild  Post-op Assessment: Post-op Vital signs reviewed, Patient's Cardiovascular Status Stable, Respiratory Function Stable, Patent Airway and No signs of Nausea or vomiting  Post-op Vital Signs: Reviewed and stable  Last Vitals:  Filed Vitals:   07/20/14 0855  BP: 118/68  Temp:   Resp: 10    Complications: No apparent anesthesia complications

## 2014-07-20 NOTE — Discharge Instructions (Signed)
[name] 07/20/2014 Dr. Iona Hansen Post operative Instructions for Cataract Patients  These instructions are for [name] and pertain to the operative eye.  1.  Resume your normal diet and previous oral medicines.  2. Your Follow-up appointment is at Dr. Iona Hansen' office in [place] on 07/21/14 at 3:10pm  3. You may leave the hospital when your driver is present and your nurse releases you.  4. Begin Pred Forte (prednisolone acetate 1%), Acular LS (ketorolac tromethamine .4%) and Gatifloxacin 0.5% eye drops; 1 drop each 4 times daily to operative eye. Begin 3 hours after discharge from Short Stay Unit.  Moxifloxacin 0.5% may be substituted for Gatifloxacin using the same instructions.  55. Page Dr. Iona Hansen via beeper [phone] for significant pain in or around operative eye that is not relieved by Tylenol.  6. If you took Plavix before surgery, restart it at the usual dose on the evening of surgery.  7. Wear dark glasses as necessary for excessive light sensitivity.  8. Do no forcefully rub you your operative eye.  9. Keep your operative eye dry for 1 week. You may gently clean your eyelids with a damp washcloth.  10. You may resume normal occupational activities in one week and resume driving as tolerated after the first post operative visit.  11. It is normal to have blurred vision and a scratchy sensation following surgery.  Dr. Iona Hansen: [phone]

## 2014-07-22 ENCOUNTER — Encounter (HOSPITAL_COMMUNITY): Payer: Self-pay | Admitting: Ophthalmology

## 2014-07-28 ENCOUNTER — Ambulatory Visit: Payer: Medicare Other | Admitting: Family Medicine

## 2014-07-29 ENCOUNTER — Ambulatory Visit (INDEPENDENT_AMBULATORY_CARE_PROVIDER_SITE_OTHER): Payer: Medicare Other | Admitting: Family Medicine

## 2014-07-29 ENCOUNTER — Encounter: Payer: Self-pay | Admitting: Family Medicine

## 2014-07-29 VITALS — BP 134/76 | HR 70 | Temp 97.6°F | Ht 64.0 in | Wt 220.0 lb

## 2014-07-29 DIAGNOSIS — E119 Type 2 diabetes mellitus without complications: Secondary | ICD-10-CM

## 2014-07-29 DIAGNOSIS — I10 Essential (primary) hypertension: Secondary | ICD-10-CM

## 2014-07-29 DIAGNOSIS — E785 Hyperlipidemia, unspecified: Secondary | ICD-10-CM

## 2014-07-29 LAB — POCT UA - MICROALBUMIN: Microalbumin Ur, POC: 100 mg/L

## 2014-07-29 LAB — POCT GLYCOSYLATED HEMOGLOBIN (HGB A1C): Hemoglobin A1C: 7.3

## 2014-07-29 NOTE — Progress Notes (Signed)
   Subjective:    Patient ID: [name], female    DOB: [birth year], 63 y.o.   MRN: [id number]  HPI  63 year old African American female with multiple problems including a metabolic syndrome of diabetes hypertension and hyperlipidemia. She also is showing signs of complications particularly of diabetes that being heart disease with heart failure and chronic kidney disease.. She is followed by a nephrologist. She complains today about having to see so many doctors and wonders if we could take care of most of her problems. She is on both long-acting and short-acting insulins namely Levemir and lispro. Her hemoglobin A1c today is 7.3 which I think is very good for her. She has been managed by a diabetologist in [place] and would like to come here for her care here. Regarding her heart failure she has no dependent edema or shortness of breath except at night when she sleeps with a pillow. This seems to Hyperflex her neck and affect her breathing and I suggested she may lay the pillow slide and sleep with a towel rolled up under her neck    Review of Systems  Constitutional: Negative.   HENT: Negative.   Eyes: Negative.   Respiratory: Negative.   Cardiovascular: Negative.   Gastrointestinal: Negative.   Endocrine: Negative.   Genitourinary: Negative.   Musculoskeletal: Positive for myalgias.       Bilateral thumb pain  Skin: Rash: not true rash but healing intertrigo.  Hematological: Negative.   Psychiatric/Behavioral: Negative.        Objective:   Physical Exam  Constitutional: She is oriented to person, place, and time. She appears well-developed and well-nourished.  Eyes: Conjunctivae and EOM are normal.  Neck: Normal range of motion. Neck supple.  Cardiovascular: Normal rate, regular rhythm and normal heart sounds.   Pulmonary/Chest: Effort normal and breath sounds normal.  Abdominal: Soft. Bowel sounds are normal.  Musculoskeletal: Normal range of motion.  Neurological: She  is alert and oriented to person, place, and time. She has normal reflexes.  Skin: Skin is warm and dry.  Psychiatric: She has a normal mood and affect. Her behavior is normal. Thought content normal.    BP 134/76  Pulse 70  Temp(Src) 97.6 F (36.4 C) (Oral)  Ht 5\' 4"  (1.626 m)  Wt 220 lb (99.791 kg)  BMI 37.74 kg/m2      Assessment & Plan:  1. DM Continue with same regimen but may try to reduce fingersticks to BID - POCT glycosylated hemoglobin (Hb A1C) - POCT UA - Microalbumin - Microalbumin, urine  2. HYPERLIPIDEMIA Cont Crestor - Microalbumin, urine  3. HYPERTENSION, BENIGN  Microalbumin, urine  Wardell Honour MD

## 2014-07-29 NOTE — Patient Instructions (Signed)

## 2014-07-30 LAB — MICROALBUMIN, URINE: Microalbumin, Urine: 2659.1 ug/mL — ABNORMAL HIGH (ref 0.0–17.0)

## 2014-08-28 ENCOUNTER — Other Ambulatory Visit: Payer: Self-pay | Admitting: Family

## 2014-08-28 ENCOUNTER — Other Ambulatory Visit: Payer: Self-pay | Admitting: Family Medicine

## 2014-09-01 ENCOUNTER — Ambulatory Visit: Payer: Medicare Other | Admitting: Family Medicine

## 2014-09-09 ENCOUNTER — Ambulatory Visit (INDEPENDENT_AMBULATORY_CARE_PROVIDER_SITE_OTHER): Payer: Medicare Other | Admitting: Family Medicine

## 2014-09-09 ENCOUNTER — Encounter: Payer: Self-pay | Admitting: Family Medicine

## 2014-09-09 DIAGNOSIS — E11649 Type 2 diabetes mellitus with hypoglycemia without coma: Secondary | ICD-10-CM

## 2014-09-09 DIAGNOSIS — I10 Essential (primary) hypertension: Secondary | ICD-10-CM

## 2014-09-09 DIAGNOSIS — Z139 Encounter for screening, unspecified: Secondary | ICD-10-CM

## 2014-09-09 DIAGNOSIS — Z23 Encounter for immunization: Secondary | ICD-10-CM

## 2014-09-09 NOTE — Addendum Note (Signed)
Addended by: Marin Olp on: 09/09/2014 02:48 PM   Modules accepted: Orders

## 2014-09-09 NOTE — Progress Notes (Signed)
   Subjective:    Patient ID: [name], female    DOB: 12-02-1951, 63 y.o.   MRN: [id number]  HPI  63 year old female here to followup her diabetes and hypertension. She currently takes Levemir, Humalog, and Trajenta. She is on sliding scale and Humalog varies from 10-15 units. She would like to stop traject Trajenta do to expense. Also reviewed her renal function today creatinine clearance is estimated at 18.9. She has seen a nephrologist before and is supposed to be followed but has not had the money    Review of Systems  Constitutional: Negative.   HENT: Negative.   Eyes: Negative.   Respiratory: Negative.   Cardiovascular: Negative.   Gastrointestinal: Negative.   Endocrine: Negative.   Genitourinary: Negative.   Neurological: Negative.        Objective:   Physical Exam  Constitutional: She appears well-developed.  HENT:  Head: Normocephalic.  Neck: Normal range of motion.  Cardiovascular: Normal rate.   Pulmonary/Chest: She has wheezes.  Abdominal: Soft.  Neurological: She is alert.   There were no vitals taken for this visit.       Assessment & Plan:  1. HYPERTENSION, BENIGN   2. Type 2 diabetes mellitus with hypoglycemia without coma Will D/C trajenta when she runs out and use more insulin, again due to expense  3. Encounter for immunization  Wardell Honour MD

## 2014-09-30 ENCOUNTER — Other Ambulatory Visit: Payer: Self-pay | Admitting: Family Medicine

## 2014-10-14 ENCOUNTER — Ambulatory Visit: Payer: Medicare Other | Admitting: Family Medicine

## 2014-10-20 ENCOUNTER — Other Ambulatory Visit: Payer: Self-pay | Admitting: Family

## 2014-10-20 ENCOUNTER — Other Ambulatory Visit: Payer: Self-pay | Admitting: Family Medicine

## 2014-10-20 NOTE — Telephone Encounter (Signed)
Last seen 09/09/14 Dr Sabra Heck  Last lipids 01/26/14

## 2014-10-21 NOTE — Telephone Encounter (Signed)
Okay to refill prescription for Crestor same dose and direction

## 2014-11-02 ENCOUNTER — Other Ambulatory Visit: Payer: Self-pay | Admitting: Family Medicine

## 2014-12-02 ENCOUNTER — Ambulatory Visit (INDEPENDENT_AMBULATORY_CARE_PROVIDER_SITE_OTHER): Payer: Medicare Other | Admitting: Family Medicine

## 2014-12-02 ENCOUNTER — Encounter: Payer: Self-pay | Admitting: Family Medicine

## 2014-12-02 VITALS — BP 153/78 | HR 76 | Temp 97.2°F | Ht 64.0 in | Wt 220.0 lb

## 2014-12-02 DIAGNOSIS — N184 Chronic kidney disease, stage 4 (severe): Secondary | ICD-10-CM

## 2014-12-02 DIAGNOSIS — E11649 Type 2 diabetes mellitus with hypoglycemia without coma: Secondary | ICD-10-CM

## 2014-12-02 DIAGNOSIS — I10 Essential (primary) hypertension: Secondary | ICD-10-CM

## 2014-12-02 MED ORDER — LINAGLIPTIN 5 MG PO TABS: 5.0000 mg | ORAL_TABLET | Freq: Every day | ORAL | Status: DC

## 2014-12-02 MED ORDER — INSULIN DETEMIR 100 UNIT/ML ~~LOC~~ SOLN: 60.0000 [IU] | Freq: Every day | SUBCUTANEOUS | Status: DC

## 2014-12-02 MED ORDER — INSULIN LISPRO 100 UNIT/ML ~~LOC~~ SOLN: 10.0000 [IU] | Freq: Three times a day (TID) | SUBCUTANEOUS | Status: DC

## 2014-12-02 NOTE — Patient Instructions (Signed)
Check fasting bs every am for 7 days- then average bs and if more than 150 increase levemir by 4units the following week. Repeat this until fasting bs average is less than 150.

## 2014-12-02 NOTE — Progress Notes (Signed)
   Subjective:    Patient ID: [name], female    DOB: [birth year], 63 y.o.   MRN: [id number]  HPI 63 year old African-American female here to follow-up diabetes hyperlipidemia and hypertension. Since her last visit she has not taken Trajenta because of affordability issues. Her sugars at home have been elevated and she asked me not to check A1c today because of that. She has not seen the nephrologist in some time because she says that he only says lose weight and manage her diabetes but I note that her creatinine clearance is an 1819% range so she is probably looking at dialysis. She has no specific complaints today.  We spent some time discussing her diabetes meds. Will try to get her under control using insulin which she seems to be able to afford. But with long and short acting insulin hopefully we can avoid some of the expense of a new her oral medicines.    Review of Systems  HENT: Positive for sinus pressure.   Eyes: Positive for visual disturbance.       Objective:   Physical Exam  Constitutional: She is oriented to person, place, and time. She appears well-developed and well-nourished.  HENT:  Head: Normocephalic and atraumatic.  Mouth/Throat: Oropharynx is clear and moist.  Eyes: Pupils are equal, round, and reactive to light.  Funduscopic exam shows diabetic retinopathy  Neck: Normal range of motion.  Cardiovascular: Normal rate and regular rhythm.   Neurological: She is alert and oriented to person, place, and time. She has normal reflexes.          Assessment & Plan:  1. HYPERTENSION, BENIGN   2. Type 2 diabetes mellitus with hypoglycemia without coma Try to manage with insulins begin by titrating long-acting based on average fasting blood sugar  3. CKD (chronic kidney disease), stage 4 (severe) Encouraged follow-up with nephrologist but patient remains resistant  Wardell Honour MD

## 2014-12-03 ENCOUNTER — Other Ambulatory Visit: Payer: Self-pay | Admitting: *Deleted

## 2014-12-03 DIAGNOSIS — E119 Type 2 diabetes mellitus without complications: Secondary | ICD-10-CM

## 2014-12-03 MED ORDER — INSULIN LISPRO 100 UNIT/ML ~~LOC~~ SOLN: 10.0000 [IU] | Freq: Three times a day (TID) | SUBCUTANEOUS | Status: DC

## 2014-12-17 ENCOUNTER — Encounter: Payer: Self-pay | Admitting: *Deleted

## 2015-01-12 ENCOUNTER — Ambulatory Visit (INDEPENDENT_AMBULATORY_CARE_PROVIDER_SITE_OTHER): Payer: Medicare Other | Admitting: Family Medicine

## 2015-01-12 ENCOUNTER — Encounter: Payer: Self-pay | Admitting: Family Medicine

## 2015-01-12 VITALS — BP 133/75 | HR 59 | Temp 97.2°F | Ht 64.0 in | Wt 213.0 lb

## 2015-01-12 DIAGNOSIS — I10 Essential (primary) hypertension: Secondary | ICD-10-CM

## 2015-01-12 DIAGNOSIS — E785 Hyperlipidemia, unspecified: Secondary | ICD-10-CM

## 2015-01-12 DIAGNOSIS — E11649 Type 2 diabetes mellitus with hypoglycemia without coma: Secondary | ICD-10-CM

## 2015-01-12 LAB — POCT GLYCOSYLATED HEMOGLOBIN (HGB A1C): Hemoglobin A1C: 8.1

## 2015-01-12 MED ORDER — VALSARTAN 320 MG PO TABS: 320.0000 mg | ORAL_TABLET | Freq: Every day | ORAL | Status: DC

## 2015-01-12 MED ORDER — INSULIN GLARGINE 100 UNIT/ML SOLOSTAR PEN: 72.0000 [IU] | PEN_INJECTOR | Freq: Every day | SUBCUTANEOUS | Status: DC

## 2015-01-12 NOTE — Progress Notes (Signed)
Subjective:    Patient ID: [name], female    DOB: Dec 22, 1950, 64 y.o.   MRN: [id number]  HPI  64 year old female with diabetes and chronic kidney disease and hypertension. We've had some issues with medications and affordability and she is artery on short and long-acting insulin and I believe we can manage her diabetes with insulin and not worrisome so much about oral medicines. When the process of first titrating up her long-acting insulin but will have to change it from Levemir to Lantus because of insurance coverage. After that we will start titrating her short acting insulin. She still declines to see the nephrologist although her creatinine clearance is low. She does see cardiologist in vascular surgeon. Apparently she has some arterial blockage in her legs. She has claudication. Since she cannot have died in her legs they have declined to do any bypass surgeries at this point.  Patient Active Problem List   Diagnosis Date Noted  . PAD (peripheral artery disease) 04/21/2014  . Leukocytosis, unspecified 02/02/2014  . Hypokalemia 09/30/2013  . Dyspnea 09/29/2013  . Abnormal chest x-ray 09/29/2013  . Acute diastolic CHF (congestive heart failure) 09/29/2013  . Community acquired pneumonia 09/29/2013  . Type 2 diabetes mellitus with hypoglycemia 01/02/2012  . CKD (chronic kidney disease) 01/02/2012  . Diverticulosis 03/29/2011  . Diabetic neuropathy 03/29/2011  . Esophageal stricture 03/29/2011  . Vitamin D deficiency 03/29/2011  . Degenerative joint disease of left shoulder 03/29/2011  . Neck pain 03/29/2011  . Diabetes mellitus without complication 43/32/9518  . Hyperlipidemia 04/19/2009  . HYPERTENSION, BENIGN 04/19/2009  . CAD 04/19/2009  . BRONCHITIS 04/19/2009   Outpatient Encounter Prescriptions as of 01/12/2015  Medication Sig  . aspirin 81 MG EC tablet Take 81 mg by mouth daily.    [name] atenolol (TENORMIN) 50 MG tablet TAKE (1) TABLET TWICE DAILY.  .  budesonide-formoterol (SYMBICORT) 160-4.5 MCG/ACT inhaler Inhale 2 puffs into the lungs 2 (two) times daily as needed (shortness of breath).  . cholecalciferol (VITAMIN D) 1000 UNITS tablet Take 1,000 Units by mouth every morning.  [name] CRESTOR 20 MG tablet TAKE 1 TABLET IN THE EVENING.  . furosemide (LASIX) 40 MG tablet TAKE 1 TABLET ONCE DAILY. TAKE AN EXTRA TABLET IF WEIGHT IS ABOVE 3LBS.  [name] gabapentin (NEURONTIN) 300 MG capsule TAKE 1 CAPSULE 3 TIMES DAILY.  [name] insulin detemir (LEVEMIR) 100 UNIT/ML injection Inject 0.6 mLs (60 Units total) into the skin at bedtime.  . insulin lispro (HUMALOG) 100 UNIT/ML injection Inject 0.1-0.13 mLs (10-13 Units total) into the skin 3 (three) times daily with meals. To be administered as directed per sliding scale: 90-150= 10  Units 151-200=11 units 201-250=12 units 251-300= 13 units  To call MD if levels are between 251-300 for three consecutive days  . linagliptin (TRADJENTA) 5 MG TABS tablet Take 1 tablet (5 mg total) by mouth daily.  [name] NIFEdipine (PROCARDIA XL/ADALAT-CC) 90 MG 24 hr tablet Take 90 mg by mouth every evening.   . potassium chloride (K-DUR) 10 MEQ tablet Take 10 mEq by mouth daily.   . valsartan-hydrochlorothiazide (DIOVAN-HCT) 320-25 MG per tablet Take 1 tablet by mouth daily.     Review of Systems  Constitutional: Positive for fatigue.  HENT: Negative.   Respiratory: Negative.   Cardiovascular: Negative.   Gastrointestinal: Negative.   Genitourinary: Negative.   Neurological: Negative.   Psychiatric/Behavioral: Negative.        Objective:   Physical Exam  Constitutional: She is oriented to person, place, and  time. She appears well-developed.  HENT:  Head: Normocephalic and atraumatic.  Eyes: Pupils are equal, round, and reactive to light.  Cardiovascular: Normal rate and regular rhythm.   Distal pulses are not palpable bilaterally  Pulmonary/Chest: Effort normal and breath sounds normal.  Musculoskeletal: Normal range of  motion.  Neurological: She is alert and oriented to person, place, and time. She has normal reflexes.  Skin: Skin is warm.  Psychiatric: She has a normal mood and affect.    BP 133/75 mmHg  Pulse 59  Temp(Src) 97.2 F (36.2 C) (Oral)  Ht '5\' 4"'  (1.626 m)  Wt 213 lb (96.616 kg)  BMI 36.54 kg/m2       Assessment & Plan:  1. Type 2 diabetes mellitus with hypoglycemia without coma A1c today is 8.2. We'll continue our titration process with Lantus and continue Humalog. - POCT glycosylated hemoglobin (Hb A1C)  2. HYPERTENSION, BENIGN Due to insurance coverage will continue on valsartan but leave off hydrochlorothiazide. In view of her renal function I doubt that hydrochlorothiazide is making a big difference if any  3. Hyperlipidemia  - Lipid panel - CMP14+EGFR  Wardell Honour MD

## 2015-01-13 LAB — CMP14+EGFR
ALT: 17 IU/L (ref 0–32)
AST: 17 IU/L (ref 0–40)
Albumin/Globulin Ratio: 1.6 (ref 1.1–2.5)
Albumin: 4 g/dL (ref 3.6–4.8)
Alkaline Phosphatase: 60 IU/L (ref 39–117)
BUN/Creatinine Ratio: 12 (ref 11–26)
BUN: 36 mg/dL — ABNORMAL HIGH (ref 8–27)
Bilirubin Total: 0.3 mg/dL (ref 0.0–1.2)
CO2: 28 mmol/L (ref 18–29)
Calcium: 9.4 mg/dL (ref 8.7–10.3)
Chloride: 99 mmol/L (ref 97–108)
Creatinine, Ser: 2.98 mg/dL — ABNORMAL HIGH (ref 0.57–1.00)
GFR calc Af Amer: 19 mL/min/{1.73_m2} — ABNORMAL LOW (ref >59)
GFR calc non Af Amer: 16 mL/min/{1.73_m2} — ABNORMAL LOW (ref >59)
Globulin, Total: 2.5 g/dL (ref 1.5–4.5)
Glucose: 194 mg/dL — ABNORMAL HIGH (ref 65–99)
Potassium: 3.6 mmol/L (ref 3.5–5.2)
Sodium: 144 mmol/L (ref 134–144)
Total Protein: 6.5 g/dL (ref 6.0–8.5)

## 2015-01-13 LAB — LIPID PANEL
Chol/HDL Ratio: 5.2 ratio units — ABNORMAL HIGH (ref 0.0–4.4)
Cholesterol, Total: 235 mg/dL — ABNORMAL HIGH (ref 100–199)
HDL: 45 mg/dL (ref >39)
LDL Calculated: 137 mg/dL — ABNORMAL HIGH (ref 0–99)
Triglycerides: 265 mg/dL — ABNORMAL HIGH (ref 0–149)
VLDL Cholesterol Cal: 53 mg/dL — ABNORMAL HIGH (ref 5–40)

## 2015-01-14 ENCOUNTER — Ambulatory Visit: Payer: Medicare Other | Admitting: Family Medicine

## 2015-02-08 ENCOUNTER — Other Ambulatory Visit: Payer: Self-pay | Admitting: Family Medicine

## 2015-03-05 ENCOUNTER — Emergency Department (HOSPITAL_COMMUNITY): Payer: Medicare Other

## 2015-03-05 ENCOUNTER — Inpatient Hospital Stay (HOSPITAL_COMMUNITY)
Admission: EM | Admit: 2015-03-05 | Discharge: 2015-03-09 | DRG: 291 | Disposition: A | Discharge status: Home / Self Care | Payer: Medicare Other | Attending: Internal Medicine | Admitting: Internal Medicine

## 2015-03-05 ENCOUNTER — Encounter (HOSPITAL_COMMUNITY): Payer: Self-pay

## 2015-03-05 DIAGNOSIS — J440 Chronic obstructive pulmonary disease with acute lower respiratory infection: Secondary | ICD-10-CM | POA: Diagnosis not present

## 2015-03-05 DIAGNOSIS — I319 Disease of pericardium, unspecified: Secondary | ICD-10-CM | POA: Diagnosis not present

## 2015-03-05 DIAGNOSIS — E559 Vitamin D deficiency, unspecified: Secondary | ICD-10-CM

## 2015-03-05 DIAGNOSIS — I509 Heart failure, unspecified: Secondary | ICD-10-CM | POA: Diagnosis not present

## 2015-03-05 DIAGNOSIS — J189 Pneumonia, unspecified organism: Secondary | ICD-10-CM | POA: Diagnosis present

## 2015-03-05 DIAGNOSIS — I313 Pericardial effusion (noninflammatory): Secondary | ICD-10-CM

## 2015-03-05 DIAGNOSIS — Z794 Long term (current) use of insulin: Secondary | ICD-10-CM

## 2015-03-05 DIAGNOSIS — E785 Hyperlipidemia, unspecified: Secondary | ICD-10-CM | POA: Diagnosis present

## 2015-03-05 DIAGNOSIS — I152 Hypertension secondary to endocrine disorders: Secondary | ICD-10-CM | POA: Diagnosis present

## 2015-03-05 DIAGNOSIS — D72829 Elevated white blood cell count, unspecified: Secondary | ICD-10-CM | POA: Diagnosis not present

## 2015-03-05 DIAGNOSIS — Z87891 Personal history of nicotine dependence: Secondary | ICD-10-CM

## 2015-03-05 DIAGNOSIS — R0789 Other chest pain: Secondary | ICD-10-CM

## 2015-03-05 DIAGNOSIS — E1165 Type 2 diabetes mellitus with hyperglycemia: Secondary | ICD-10-CM | POA: Diagnosis present

## 2015-03-05 DIAGNOSIS — E1140 Type 2 diabetes mellitus with diabetic neuropathy, unspecified: Secondary | ICD-10-CM | POA: Diagnosis present

## 2015-03-05 DIAGNOSIS — I3139 Other pericardial effusion (noninflammatory): Secondary | ICD-10-CM

## 2015-03-05 DIAGNOSIS — I10 Essential (primary) hypertension: Secondary | ICD-10-CM | POA: Diagnosis not present

## 2015-03-05 DIAGNOSIS — E1121 Type 2 diabetes mellitus with diabetic nephropathy: Secondary | ICD-10-CM | POA: Diagnosis present

## 2015-03-05 DIAGNOSIS — E1159 Type 2 diabetes mellitus with other circulatory complications: Secondary | ICD-10-CM | POA: Diagnosis present

## 2015-03-05 DIAGNOSIS — Z6839 Body mass index (BMI) 39.0-39.9, adult: Secondary | ICD-10-CM | POA: Diagnosis not present

## 2015-03-05 DIAGNOSIS — D631 Anemia in chronic kidney disease: Secondary | ICD-10-CM | POA: Diagnosis present

## 2015-03-05 DIAGNOSIS — E119 Type 2 diabetes mellitus without complications: Secondary | ICD-10-CM | POA: Diagnosis not present

## 2015-03-05 DIAGNOSIS — I5032 Chronic diastolic (congestive) heart failure: Secondary | ICD-10-CM | POA: Diagnosis present

## 2015-03-05 DIAGNOSIS — E1169 Type 2 diabetes mellitus with other specified complication: Secondary | ICD-10-CM | POA: Diagnosis present

## 2015-03-05 DIAGNOSIS — I129 Hypertensive chronic kidney disease with stage 1 through stage 4 chronic kidney disease, or unspecified chronic kidney disease: Secondary | ICD-10-CM | POA: Diagnosis present

## 2015-03-05 DIAGNOSIS — I25118 Atherosclerotic heart disease of native coronary artery with other forms of angina pectoris: Secondary | ICD-10-CM | POA: Diagnosis present

## 2015-03-05 DIAGNOSIS — N184 Chronic kidney disease, stage 4 (severe): Secondary | ICD-10-CM | POA: Diagnosis present

## 2015-03-05 DIAGNOSIS — I5031 Acute diastolic (congestive) heart failure: Secondary | ICD-10-CM

## 2015-03-05 DIAGNOSIS — R222 Localized swelling, mass and lump, trunk: Secondary | ICD-10-CM

## 2015-03-05 DIAGNOSIS — R0600 Dyspnea, unspecified: Secondary | ICD-10-CM | POA: Diagnosis not present

## 2015-03-05 DIAGNOSIS — I5033 Acute on chronic diastolic (congestive) heart failure: Principal | ICD-10-CM

## 2015-03-05 DIAGNOSIS — I2510 Atherosclerotic heart disease of native coronary artery without angina pectoris: Secondary | ICD-10-CM | POA: Diagnosis present

## 2015-03-05 DIAGNOSIS — Z7982 Long term (current) use of aspirin: Secondary | ICD-10-CM

## 2015-03-05 DIAGNOSIS — Z6830 Body mass index (BMI) 30.0-30.9, adult: Secondary | ICD-10-CM | POA: Diagnosis present

## 2015-03-05 DIAGNOSIS — I739 Peripheral vascular disease, unspecified: Secondary | ICD-10-CM

## 2015-03-05 DIAGNOSIS — Z7951 Long term (current) use of inhaled steroids: Secondary | ICD-10-CM | POA: Diagnosis not present

## 2015-03-05 DIAGNOSIS — K222 Esophageal obstruction: Secondary | ICD-10-CM

## 2015-03-05 DIAGNOSIS — J441 Chronic obstructive pulmonary disease with (acute) exacerbation: Secondary | ICD-10-CM | POA: Diagnosis present

## 2015-03-05 DIAGNOSIS — Z955 Presence of coronary angioplasty implant and graft: Secondary | ICD-10-CM | POA: Diagnosis not present

## 2015-03-05 DIAGNOSIS — T501X5A Adverse effect of loop [high-ceiling] diuretics, initial encounter: Secondary | ICD-10-CM | POA: Diagnosis present

## 2015-03-05 DIAGNOSIS — T380X5A Adverse effect of glucocorticoids and synthetic analogues, initial encounter: Secondary | ICD-10-CM | POA: Diagnosis present

## 2015-03-05 DIAGNOSIS — R0902 Hypoxemia: Secondary | ICD-10-CM | POA: Diagnosis present

## 2015-03-05 DIAGNOSIS — Z951 Presence of aortocoronary bypass graft: Secondary | ICD-10-CM | POA: Diagnosis not present

## 2015-03-05 DIAGNOSIS — E876 Hypokalemia: Secondary | ICD-10-CM | POA: Diagnosis present

## 2015-03-05 DIAGNOSIS — M1990 Unspecified osteoarthritis, unspecified site: Secondary | ICD-10-CM | POA: Diagnosis present

## 2015-03-05 DIAGNOSIS — R0602 Shortness of breath: Secondary | ICD-10-CM | POA: Diagnosis present

## 2015-03-05 DIAGNOSIS — R221 Localized swelling, mass and lump, neck: Secondary | ICD-10-CM

## 2015-03-05 DIAGNOSIS — J209 Acute bronchitis, unspecified: Secondary | ICD-10-CM | POA: Diagnosis present

## 2015-03-05 DIAGNOSIS — N289 Disorder of kidney and ureter, unspecified: Secondary | ICD-10-CM

## 2015-03-05 DIAGNOSIS — J449 Chronic obstructive pulmonary disease, unspecified: Secondary | ICD-10-CM | POA: Diagnosis present

## 2015-03-05 DIAGNOSIS — R9389 Abnormal findings on diagnostic imaging of other specified body structures: Secondary | ICD-10-CM

## 2015-03-05 DIAGNOSIS — M542 Cervicalgia: Secondary | ICD-10-CM

## 2015-03-05 HISTORY — DX: Type 2 diabetes mellitus without complications: E11.9

## 2015-03-05 HISTORY — DX: Long term (current) use of insulin: Z79.4

## 2015-03-05 HISTORY — DX: Acute on chronic diastolic (congestive) heart failure: I50.33

## 2015-03-05 HISTORY — DX: Pericardial effusion (noninflammatory): I31.3

## 2015-03-05 HISTORY — DX: Reserved for inherently not codable concepts without codable children: IMO0001

## 2015-03-05 LAB — CBC WITH DIFFERENTIAL/PLATELET
Basophils Absolute: 0 10*3/uL (ref 0.0–0.1)
Basophils Relative: 0 % (ref 0–1)
Eosinophils Absolute: 0.5 10*3/uL (ref 0.0–0.7)
Eosinophils Relative: 4 % (ref 0–5)
HCT: 36.2 % (ref 36.0–46.0)
Hemoglobin: 12 g/dL (ref 12.0–15.0)
Lymphocytes Relative: 22 % (ref 12–46)
Lymphs Abs: 3 10*3/uL (ref 0.7–4.0)
MCH: 26.9 pg (ref 26.0–34.0)
MCHC: 33.1 g/dL (ref 30.0–36.0)
MCV: 81.2 fL (ref 78.0–100.0)
Monocytes Absolute: 0.7 10*3/uL (ref 0.1–1.0)
Monocytes Relative: 5 % (ref 3–12)
Neutro Abs: 9.5 10*3/uL — ABNORMAL HIGH (ref 1.7–7.7)
Neutrophils Relative %: 69 % (ref 43–77)
Platelets: 295 10*3/uL (ref 150–400)
RBC: 4.46 MIL/uL (ref 3.87–5.11)
RDW: 15.7 % — ABNORMAL HIGH (ref 11.5–15.5)
WBC: 13.7 10*3/uL — ABNORMAL HIGH (ref 4.0–10.5)

## 2015-03-05 LAB — COMPREHENSIVE METABOLIC PANEL
ALT: 34 U/L (ref 0–35)
AST: 38 U/L — ABNORMAL HIGH (ref 0–37)
Albumin: 3.3 g/dL — ABNORMAL LOW (ref 3.5–5.2)
Alkaline Phosphatase: 62 U/L (ref 39–117)
Anion gap: 9 (ref 5–15)
BUN: 41 mg/dL — ABNORMAL HIGH (ref 6–23)
CO2: 27 mmol/L (ref 19–32)
Calcium: 8.9 mg/dL (ref 8.4–10.5)
Chloride: 105 mmol/L (ref 96–112)
Creatinine, Ser: 3.11 mg/dL — ABNORMAL HIGH (ref 0.50–1.10)
GFR calc Af Amer: 17 mL/min — ABNORMAL LOW (ref >90)
GFR calc non Af Amer: 15 mL/min — ABNORMAL LOW (ref >90)
Glucose, Bld: 162 mg/dL — ABNORMAL HIGH (ref 70–99)
Potassium: 3.1 mmol/L — ABNORMAL LOW (ref 3.5–5.1)
Sodium: 141 mmol/L (ref 135–145)
Total Bilirubin: 0.5 mg/dL (ref 0.3–1.2)
Total Protein: 6.6 g/dL (ref 6.0–8.3)

## 2015-03-05 LAB — GLUCOSE, RANDOM: Glucose, Bld: 380 mg/dL — ABNORMAL HIGH (ref 70–99)

## 2015-03-05 LAB — BRAIN NATRIURETIC PEPTIDE: B Natriuretic Peptide: 292 pg/mL — ABNORMAL HIGH (ref 0.0–100.0)

## 2015-03-05 LAB — GLUCOSE, CAPILLARY
Glucose-Capillary: 460 mg/dL — ABNORMAL HIGH (ref 70–99)
Glucose-Capillary: 408 mg/dL — ABNORMAL HIGH (ref 70–99)

## 2015-03-05 LAB — CBC
HCT: 32.2 % — ABNORMAL LOW (ref 36.0–46.0)
Hemoglobin: 10.8 g/dL — ABNORMAL LOW (ref 12.0–15.0)
MCH: 27.2 pg (ref 26.0–34.0)
MCHC: 33.5 g/dL (ref 30.0–36.0)
MCV: 81.1 fL (ref 78.0–100.0)
Platelets: 281 10*3/uL (ref 150–400)
RBC: 3.97 MIL/uL (ref 3.87–5.11)
RDW: 15.7 % — ABNORMAL HIGH (ref 11.5–15.5)
WBC: 12.7 10*3/uL — ABNORMAL HIGH (ref 4.0–10.5)

## 2015-03-05 LAB — CREATININE, SERUM
Creatinine, Ser: 3.26 mg/dL — ABNORMAL HIGH (ref 0.50–1.10)
GFR calc Af Amer: 16 mL/min — ABNORMAL LOW (ref >90)
GFR calc non Af Amer: 14 mL/min — ABNORMAL LOW (ref >90)

## 2015-03-05 LAB — TROPONIN I
Troponin I: <0.03 ng/mL (ref <0.031)
Troponin I: <0.03 ng/mL (ref <0.031)
Troponin I: <0.03 ng/mL (ref <0.031)
Troponin I: <0.03 ng/mL (ref <0.031)

## 2015-03-05 LAB — TSH: TSH: 2.261 u[IU]/mL (ref 0.350–4.500)

## 2015-03-05 MED ORDER — ROSUVASTATIN CALCIUM 20 MG PO TABS
20.0000 mg | ORAL_TABLET | Freq: Every day | ORAL | Status: DC
  Administered 2015-03-05 – 2015-03-09 (×5): 20 mg via ORAL
  Filled 2015-03-05 (×5): qty 1

## 2015-03-05 MED ORDER — ASPIRIN EC 81 MG PO TBEC
81.0000 mg | DELAYED_RELEASE_TABLET | Freq: Every day | ORAL | Status: DC
  Administered 2015-03-05 – 2015-03-09 (×5): 81 mg via ORAL
  Filled 2015-03-05 (×5): qty 1

## 2015-03-05 MED ORDER — FUROSEMIDE 10 MG/ML IJ SOLN
40.0000 mg | Freq: Two times a day (BID) | INTRAMUSCULAR | Status: DC
  Administered 2015-03-05: 40 mg via INTRAVENOUS
  Filled 2015-03-05: qty 4

## 2015-03-05 MED ORDER — NIFEDIPINE ER OSMOTIC RELEASE 30 MG PO TB24
90.0000 mg | ORAL_TABLET | Freq: Every evening | ORAL | Status: DC
  Administered 2015-03-05 – 2015-03-08 (×4): 90 mg via ORAL
  Filled 2015-03-05 (×4): qty 3

## 2015-03-05 MED ORDER — FUROSEMIDE 10 MG/ML IJ SOLN
20.0000 mg | Freq: Two times a day (BID) | INTRAMUSCULAR | Status: DC
  Administered 2015-03-05 – 2015-03-07 (×4): 20 mg via INTRAVENOUS
  Filled 2015-03-05 (×4): qty 2

## 2015-03-05 MED ORDER — HEPARIN SODIUM (PORCINE) 5000 UNIT/ML IJ SOLN
5000.0000 [IU] | Freq: Three times a day (TID) | INTRAMUSCULAR | Status: DC
  Administered 2015-03-05 – 2015-03-09 (×14): 5000 [IU] via SUBCUTANEOUS
  Filled 2015-03-05 (×14): qty 1

## 2015-03-05 MED ORDER — INSULIN GLARGINE 100 UNIT/ML ~~LOC~~ SOLN
35.0000 [IU] | Freq: Every day | SUBCUTANEOUS | Status: DC
  Filled 2015-03-05 (×3): qty 0.35

## 2015-03-05 MED ORDER — ALBUTEROL SULFATE (2.5 MG/3ML) 0.083% IN NEBU
2.5000 mg | INHALATION_SOLUTION | Freq: Four times a day (QID) | RESPIRATORY_TRACT | Status: DC
  Administered 2015-03-05: 2.5 mg via RESPIRATORY_TRACT
  Filled 2015-03-05: qty 3

## 2015-03-05 MED ORDER — GABAPENTIN 300 MG PO CAPS
300.0000 mg | ORAL_CAPSULE | Freq: Three times a day (TID) | ORAL | Status: DC
  Administered 2015-03-05 – 2015-03-06 (×4): 300 mg via ORAL
  Filled 2015-03-05 (×4): qty 1

## 2015-03-05 MED ORDER — VITAMIN D 1000 UNITS PO TABS
1000.0000 [IU] | ORAL_TABLET | Freq: Every morning | ORAL | Status: DC
  Administered 2015-03-05 – 2015-03-09 (×5): 1000 [IU] via ORAL
  Filled 2015-03-05 (×5): qty 1

## 2015-03-05 MED ORDER — ASPIRIN 81 MG PO TBEC: 81.0000 mg | DELAYED_RELEASE_TABLET | Freq: Every day | ORAL | Status: DC

## 2015-03-05 MED ORDER — INSULIN ASPART 100 UNIT/ML ~~LOC~~ SOLN: 0.0000 [IU] | Freq: Three times a day (TID) | SUBCUTANEOUS | Status: DC

## 2015-03-05 MED ORDER — LEVOFLOXACIN IN D5W 500 MG/100ML IV SOLN: 500.0000 mg | INTRAVENOUS | Status: DC

## 2015-03-05 MED ORDER — INSULIN GLARGINE 100 UNIT/ML ~~LOC~~ SOLN
60.0000 [IU] | Freq: Every day | SUBCUTANEOUS | Status: DC
  Administered 2015-03-05 – 2015-03-08 (×4): 60 [IU] via SUBCUTANEOUS
  Filled 2015-03-05 (×5): qty 0.6

## 2015-03-05 MED ORDER — INSULIN ASPART 100 UNIT/ML ~~LOC~~ SOLN: 0.0000 [IU] | Freq: Every day | SUBCUTANEOUS | Status: DC

## 2015-03-05 MED ORDER — ALBUTEROL SULFATE (2.5 MG/3ML) 0.083% IN NEBU
2.5000 mg | INHALATION_SOLUTION | Freq: Once | RESPIRATORY_TRACT | Status: AC
  Administered 2015-03-05: 2.5 mg via RESPIRATORY_TRACT
  Filled 2015-03-05: qty 3

## 2015-03-05 MED ORDER — IPRATROPIUM BROMIDE 0.02 % IN SOLN: 0.5000 mg | Freq: Once | RESPIRATORY_TRACT | Status: DC

## 2015-03-05 MED ORDER — POTASSIUM CHLORIDE IN NACL 20-0.45 MEQ/L-% IV SOLN
INTRAVENOUS | Status: DC
  Administered 2015-03-05 – 2015-03-06 (×2): via INTRAVENOUS
  Filled 2015-03-05 (×5): qty 1000

## 2015-03-05 MED ORDER — INSULIN ASPART 100 UNIT/ML ~~LOC~~ SOLN
0.0000 [IU] | SUBCUTANEOUS | Status: DC
  Administered 2015-03-05: 20 [IU] via SUBCUTANEOUS
  Administered 2015-03-05 – 2015-03-06 (×2): 4 [IU] via SUBCUTANEOUS

## 2015-03-05 MED ORDER — NITROGLYCERIN 2 % TD OINT
1.0000 [in_us] | TOPICAL_OINTMENT | Freq: Once | TRANSDERMAL | Status: AC
  Administered 2015-03-05: 1 [in_us] via TOPICAL
  Filled 2015-03-05: qty 1

## 2015-03-05 MED ORDER — ALBUTEROL (5 MG/ML) CONTINUOUS INHALATION SOLN
10.0000 mg/h | INHALATION_SOLUTION | Freq: Once | RESPIRATORY_TRACT | Status: AC
  Administered 2015-03-05: 10 mg/h via RESPIRATORY_TRACT
  Filled 2015-03-05: qty 20

## 2015-03-05 MED ORDER — IPRATROPIUM BROMIDE 0.02 % IN SOLN
0.5000 mg | Freq: Once | RESPIRATORY_TRACT | Status: AC
  Administered 2015-03-05: 0.5 mg via RESPIRATORY_TRACT
  Filled 2015-03-05: qty 2.5

## 2015-03-05 MED ORDER — LEVOFLOXACIN IN D5W 250 MG/50ML IV SOLN
250.0000 mg | INTRAVENOUS | Status: DC
  Administered 2015-03-06 – 2015-03-07 (×2): 250 mg via INTRAVENOUS
  Filled 2015-03-05 (×3): qty 50

## 2015-03-05 MED ORDER — FUROSEMIDE 10 MG/ML IJ SOLN
80.0000 mg | Freq: Once | INTRAMUSCULAR | Status: AC
  Administered 2015-03-05: 80 mg via INTRAVENOUS
  Filled 2015-03-05: qty 8

## 2015-03-05 MED ORDER — INSULIN GLARGINE 100 UNIT/ML ~~LOC~~ SOLN
72.0000 [IU] | Freq: Every day | SUBCUTANEOUS | Status: DC
  Filled 2015-03-05 (×3): qty 0.72

## 2015-03-05 MED ORDER — ATENOLOL 25 MG PO TABS
50.0000 mg | ORAL_TABLET | Freq: Every day | ORAL | Status: DC
Start: 2015-03-05 — End: 2015-03-09
  Administered 2015-03-05 – 2015-03-09 (×5): 50 mg via ORAL
  Filled 2015-03-05 (×5): qty 2

## 2015-03-05 MED ORDER — INSULIN NPH (HUMAN) (ISOPHANE) 100 UNIT/ML ~~LOC~~ SUSP
5.0000 [IU] | Freq: Once | SUBCUTANEOUS | Status: AC
  Administered 2015-03-05: 5 [IU] via SUBCUTANEOUS
  Filled 2015-03-05: qty 10

## 2015-03-05 MED ORDER — IPRATROPIUM-ALBUTEROL 0.5-2.5 (3) MG/3ML IN SOLN
3.0000 mL | Freq: Once | RESPIRATORY_TRACT | Status: AC
  Administered 2015-03-05: 3 mL via RESPIRATORY_TRACT
  Filled 2015-03-05: qty 3

## 2015-03-05 MED ORDER — LEVOFLOXACIN IN D5W 500 MG/100ML IV SOLN
500.0000 mg | Freq: Once | INTRAVENOUS | Status: AC
  Administered 2015-03-05: 500 mg via INTRAVENOUS
  Filled 2015-03-05: qty 100

## 2015-03-05 MED ORDER — CETYLPYRIDINIUM CHLORIDE 0.05 % MT LIQD
7.0000 mL | Freq: Two times a day (BID) | OROMUCOSAL | Status: DC
  Administered 2015-03-05 – 2015-03-09 (×9): 7 mL via OROMUCOSAL

## 2015-03-05 MED ORDER — ALBUTEROL SULFATE (2.5 MG/3ML) 0.083% IN NEBU: 5.0000 mg | INHALATION_SOLUTION | Freq: Once | RESPIRATORY_TRACT | Status: DC

## 2015-03-05 MED ORDER — ALBUTEROL SULFATE (2.5 MG/3ML) 0.083% IN NEBU
2.5000 mg | INHALATION_SOLUTION | Freq: Three times a day (TID) | RESPIRATORY_TRACT | Status: DC
  Administered 2015-03-05 – 2015-03-06 (×4): 2.5 mg via RESPIRATORY_TRACT
  Filled 2015-03-05 (×4): qty 3

## 2015-03-05 MED ORDER — BUDESONIDE-FORMOTEROL FUMARATE 160-4.5 MCG/ACT IN AERO
2.0000 | INHALATION_SPRAY | Freq: Two times a day (BID) | RESPIRATORY_TRACT | Status: DC | PRN
  Administered 2015-03-06 – 2015-03-07 (×4): 2 via RESPIRATORY_TRACT
  Filled 2015-03-05: qty 6

## 2015-03-05 MED ORDER — IRBESARTAN 150 MG PO TABS
150.0000 mg | ORAL_TABLET | Freq: Every day | ORAL | Status: DC
  Administered 2015-03-05: 150 mg via ORAL
  Filled 2015-03-05: qty 1

## 2015-03-05 NOTE — Progress Notes (Signed)
Notified MD that patients blood sugar was 408. MD ordered for patient to receive 20 units of novolog at this time. Patient had no complaints. STAT glucose was ordered per protocol. Will continue to monitor patient at this time.

## 2015-03-05 NOTE — Progress Notes (Signed)
ANTIBIOTIC CONSULT NOTE  Pharmacy Consult for Levaquin Indication: COPD exacerbation  Allergies  Allergen Reactions  . Ace Inhibitors Other (See Comments)    cough  . Clopidogrel Bisulfate     REACTION: Sick, Headache, "Felt terrible"  . Codeine Other (See Comments)    REACTION: hallucinations  . Lisinopril Other (See Comments)    cough  . Penicillins     unknown    Patient Measurements: Height: 5\' 2"  (157.5 cm) Weight: 220 lb 12.8 oz (100.154 kg) IBW/kg (Calculated) : 50.1  Vital Signs: Temp: 98.3 F (36.8 C) (04/01 0650) Temp Source: Oral (04/01 0650) BP: 141/61 mmHg (04/01 0650) Pulse Rate: 88 (04/01 1155) Intake/Output from previous day:   Intake/Output from this shift: Total I/O In: 120 [P.O.:120] Out: -   Labs:  Recent Labs  03/05/15 0221 03/05/15 0848  WBC 13.7* 12.7*  HGB 12.0 10.8*  PLT 295 281  CREATININE 3.11* 3.26*   Estimated Creatinine Clearance: 19.8 mL/min (by C-G formula based on Cr of 3.26). No results for input(s): VANCOTROUGH, VANCOPEAK, VANCORANDOM, GENTTROUGH, GENTPEAK, GENTRANDOM, TOBRATROUGH, TOBRAPEAK, TOBRARND, AMIKACINPEAK, AMIKACINTROU, AMIKACIN in the last 72 hours.   Microbiology: No results found for this or any previous visit (from the past 720 hour(s)).  Anti-infectives    Start     Dose/Rate Route Frequency Ordered Stop   03/06/15 0600  levofloxacin (LEVAQUIN) IVPB 500 mg     500 mg 100 mL/hr over 60 Minutes Intravenous Every 24 hours 03/05/15 0704 04/05/15 0559   03/05/15 0530  levofloxacin (LEVAQUIN) IVPB 500 mg     500 mg 100 mL/hr over 60 Minutes Intravenous  Once 03/05/15 0519 03/05/15 0631      Assessment: 64 yo F admitted with COPD exacerbation.  CXR + possible PNA.  She is afebrile on admission with elevated WBC.   Acute renal injury noted.  NCrCl ~ 76ml/min (borderline for dosage adjustment cut-off) First dose of Levaquin initiated in ED for possible PNA.   Levaquin 4/1>>  Goal of Therapy:  Eradicate  infection.  Plan:  Levaquin 250mg  IV q24h - next dose due 4/2 @ 0600 Change to PO once clinically appropriate Monitor renal function and cx data   Biagio Borg 03/05/2015,1:58 PM

## 2015-03-05 NOTE — Progress Notes (Signed)
  Echocardiogram 2D Echocardiogram has been performed.  Samuel Germany 03/05/2015, 11:55 AM

## 2015-03-05 NOTE — Progress Notes (Signed)
Notified Dr. Caryn Section of the patients CBG 460.  New orders placed. I will pass on to the oncoming nurse that there is new orders since the NPH has to brought to the unit by the pharmacy.

## 2015-03-05 NOTE — ED Provider Notes (Signed)
CSN: [id number]     Arrival date & time 03/05/15  0149 History   First MD Initiated Contact with Patient 03/05/15 0153     Chief Complaint  Patient presents with  . Shortness of Breath     (Consider location/radiation/quality/duration/timing/severity/associated sxs/prior Treatment) HPI  Patient reports she started feeling bad 2 nights ago. She denies cough or fever but has been having wheezing, shortness of breath and chest tightness. She denies sore throat or rhinorrhea. She states she does have ankle swelling at night. She reports she only has a Symbicort inhaler to use for her breathing. She states she was taken off all of her other inhalers by her primary care doctor. She actually saw her nephrologist yesterday who told her she needed to see her cardiologist. He did change her medicine and did increase her fluid pills by 50%. She has an appointment to see her cardiologist in 3 days. EMS gave the patient a albuterol/Atrovent nebulizer, Solu-Medrol and aspirin. She reports her breathing is much improved.   PCP Josie Saunders FP in [facility] Cardiology Dr Stanford Breed Nephrology Dr Lowanda Foster   Past Medical History  Diagnosis Date  . Esophageal stricture   . Hyperlipidemia   . Hypertension   . Bronchitis   . CAD (coronary artery disease)     s/p Stenting in 2005;  [place] 7/09: Vigorous LV function, 2-3+ MR, RI 40%, RI stent patent, proximal-mid RCA 40-50%;  [place] 7/09:  EF 55-65%, trivial MR;  [place] 11/18/12: EF 66%, normal LV wall motion, mild to moderate anterior ischemia - reviewed by [facility] and felt to rep breast atten (low risk);  [place] 6/14: mild LVH, EF 55-65%, Gr 2 DD, PASP 44, trivial eff   . Arthritis   . Non-insulin dependent diabetes mellitus   . Diverticulosis   . Diabetic neuropathy   . Vitamin D deficiency   . Degenerative joint disease     left shoulder  . Chronic neck pain   . Shortness of breath     at night  . Headache(784.0)   . Anemia     hx of  . Stroke     "mini-  years ago"  . Chronic kidney disease     CREATININE IS UP--GOING TO KIDNEY MD   . CHF (congestive heart failure)    Past Surgical History  Procedure Laterality Date  . Bladder surgery      Tack  . Partial hysterectomy    . Shoulder surgery Right     Rotator cuff  . Cervical spine surgery  2012    8 screws  . Abdominal hysterectomy    . Laminectomy  12/29/2011  . Cardiac stents    . Lumbar laminectomy/decompression microdiscectomy  12/29/2011    Procedure: LUMBAR LAMINECTOMY/DECOMPRESSION MICRODISCECTOMY;  Surgeon: Floyce Stakes, MD;  Location: [place] NEURO ORS;  Service: Neurosurgery;  Laterality: N/A;  Lumbar Two through Lumbar Five Laminectomies/Cellsaver  . Back surgery      2007 & 2013  . Cystoscopy with injection N/A 06/03/2013    Procedure: MACROPLASTIQUE  INJECTION ;  Surgeon: Malka So, MD;  Location: [facility] ORS;  Service: Urology;  Laterality: N/A;  . Cardiac catheterization  2003, 2005, 2009    1 stent  . Cataract extraction w/phaco Right 05/04/2014    Procedure: CATARACT EXTRACTION PHACO AND INTRAOCULAR LENS PLACEMENT (IOC);  Surgeon: Williams Che, MD;  Location: [facility] ORS;  Service: Ophthalmology;  Laterality: Right;  CDE 1.47  . Cataract extraction w/phaco Left 07/20/2014  Procedure: CATARACT EXTRACTION WITH PHACO AND INTRAOCULAR LENS PLACEMENT LEFT EYE CDE=1.79;  Surgeon: Williams Che, MD;  Location: [facility];  Service: Ophthalmology;  Laterality: Left;   Family History  Problem Relation Age of Onset  . Heart disease Other    History  Substance Use Topics  . Smoking status: Former Smoker -- 1.00 packs/day for 30 years    Quit date: 12/04/2001  . Smokeless tobacco: Never Used  . Alcohol Use: Yes     Comment: occasional   Lives alone No oxygen at home  OB History    No data available     Review of Systems  All other systems reviewed and are negative.     Allergies  Ace inhibitors; Clopidogrel bisulfate; Codeine; Lisinopril; and Penicillins  Home  Medications   Prior to Admission medications   Medication Sig Start Date End Date Taking? Authorizing Provider  aspirin 81 MG EC tablet Take 81 mg by mouth daily.      Historical Provider, MD  atenolol (TENORMIN) 50 MG tablet TAKE (1) TABLET TWICE DAILY. 10/01/14   Wardell Honour, MD  budesonide-formoterol [facility]) 160-4.5 MCG/ACT inhaler Inhale 2 puffs into the lungs 2 (two) times daily as needed (shortness of breath).    Historical Provider, MD  cholecalciferol (VITAMIN D) 1000 UNITS tablet Take 1,000 Units by mouth every morning.    Historical Provider, MD  CRESTOR 20 MG tablet TAKE 1 TABLET IN THE EVENING. 02/09/15   Wardell Honour, MD  furosemide (LASIX) 40 MG tablet TAKE 1 TABLET ONCE DAILY. TAKE AN EXTRA TABLET IF WEIGHT IS ABOVE 3LBS. 10/01/14   Wardell Honour, MD  gabapentin (NEURONTIN) 300 MG capsule TAKE 1 CAPSULE 3 TIMES DAILY. 02/09/15   Wardell Honour, MD  insulin detemir (LEVEMIR) 100 UNIT/ML injection Inject 0.6 mLs (60 Units total) into the skin at bedtime. 12/02/14   Wardell Honour, MD  Insulin Glargine (LANTUS) 100 UNIT/ML Solostar Pen Inject 72 Units into the skin daily at 10 pm. 01/12/15   Wardell Honour, MD  insulin lispro (HUMALOG) 100 UNIT/ML injection Inject 0.1-0.13 mLs (10-13 Units total) into the skin 3 (three) times daily with meals. To be administered as directed per sliding scale: 90-150= 10  Units 151-200=11 units 201-250=12 units 251-300= 13 units  To call MD if levels are between 251-300 for three consecutive days 12/03/14   Wardell Honour, MD  NIFEdipine (PROCARDIA XL/ADALAT-CC) 90 MG 24 hr tablet Take 90 mg by mouth every evening.     Historical Provider, MD  potassium chloride (K-DUR) 10 MEQ tablet Take 10 mEq by mouth daily.  04/17/14   Historical Provider, MD  valsartan (DIOVAN) 320 MG tablet Take 1 tablet (320 mg total) by mouth daily. 01/12/15   Wardell Honour, MD   BP 136/65 mmHg  Pulse 84  Temp(Src) 98 F (36.7 C) (Oral)  Resp 22  Ht 5'  2" (1.575 m)  Wt 220 lb (99.791 kg)  BMI 40.23 kg/m2  SpO2 96%  Vital signs normal   Physical Exam  Constitutional: She is oriented to person, place, and time. She appears well-developed and well-nourished.  Non-toxic appearance. She does not appear ill. No distress.  HENT:  Head: Normocephalic and atraumatic.  Right Ear: External ear normal.  Left Ear: External ear normal.  Nose: Nose normal. No mucosal edema or rhinorrhea.  Mouth/Throat: Oropharynx is clear and moist and mucous membranes are normal. No dental abscesses or uvula swelling.  Eyes: Conjunctivae and EOM  are normal. Pupils are equal, round, and reactive to light.  Neck: Normal range of motion and full passive range of motion without pain. Neck supple.  Cardiovascular: Normal rate, regular rhythm and normal heart sounds.  Exam reveals no gallop and no friction rub.   No murmur heard. Pulmonary/Chest: Effort normal. Tachypnea noted. No respiratory distress. She has decreased breath sounds. She has wheezes. She has no rhonchi. She has no rales. She exhibits no tenderness and no crepitus.  Abdominal: Soft. Normal appearance and bowel sounds are normal. She exhibits no distension. There is no tenderness. There is no rebound and no guarding.  Musculoskeletal: Normal range of motion. She exhibits no edema or tenderness.  Moves all extremities well.   Neurological: She is alert and oriented to person, place, and time. She has normal strength. No cranial nerve deficit.  Skin: Skin is warm, dry and intact. No rash noted. No erythema. No pallor.  Psychiatric: She has a normal mood and affect. Her speech is normal and behavior is normal. Her mood appears not anxious.  Nursing note and vitals reviewed.   ED Course  Procedures (including critical care time)  Medications  levofloxacin (LEVAQUIN) IVPB 500 mg (not administered)  albuterol (PROVENTIL,VENTOLIN) solution continuous neb (10 mg/hr Nebulization Given 03/05/15 0215)   ipratropium (ATROVENT) nebulizer solution 0.5 mg (0.5 mg Nebulization Given 03/05/15 0215)  nitroGLYCERIN (NITROGLYN) 2 % ointment 1 inch (1 inch Topical Given 03/05/15 0302)  furosemide (LASIX) injection 80 mg (80 mg Intravenous Given 03/05/15 0302)  ipratropium-albuterol (DUONEB) 0.5-2.5 (3) MG/3ML nebulizer solution 3 mL (3 mLs Nebulization Given 03/05/15 0503)  albuterol (PROVENTIL) (2.5 MG/3ML) 0.083% nebulizer solution 2.5 mg (2.5 mg Nebulization Given 03/05/15 0504)   03:00 patient still getting her continuous nebulizer. She indicates she is not feeling any better.  04:20 patient has finished her continuous nebulizer. She states she feels minimally improved. Her pulse ox is 96-97% on 3 L nasal cannula. We discussed ambulation with a pulse ox monitoring to see if she becomes hypoxic with exertion.  Nursing staff ambulate with patient around the nursing station. They report her O2 sat dropped to 87%.   5 AM patient given a albuterol/Atrovent nebulizer (third treatment in the ED). I'm going to talk to hospice about admission.  05:18 Dr Marin Comment will see patient in the ED.   Labs Review Results for orders placed or performed during the hospital encounter of 03/05/15  Comprehensive metabolic panel  Result Value Ref Range   Sodium 141 135 - 145 mmol/L   Potassium 3.1 (L) 3.5 - 5.1 mmol/L   Chloride 105 96 - 112 mmol/L   CO2 27 19 - 32 mmol/L   Glucose, Bld 162 (H) 70 - 99 mg/dL   BUN 41 (H) 6 - 23 mg/dL   Creatinine, Ser 3.11 (H) 0.50 - 1.10 mg/dL   Calcium 8.9 8.4 - 10.5 mg/dL   Total Protein 6.6 6.0 - 8.3 g/dL   Albumin 3.3 (L) 3.5 - 5.2 g/dL   AST 38 (H) 0 - 37 U/L   ALT 34 0 - 35 U/L   Alkaline Phosphatase 62 39 - 117 U/L   Total Bilirubin 0.5 0.3 - 1.2 mg/dL   GFR calc non Af Amer 15 (L) >90 mL/min   GFR calc Af Amer 17 (L) >90 mL/min   Anion gap 9 5 - 15  Brain natriuretic peptide  Result Value Ref Range   B Natriuretic Peptide 292.0 (H) 0.0 - 100.0 pg/mL  Troponin I  Result  Value  Ref Range   Troponin I <0.03 <0.031 ng/mL  CBC with Differential  Result Value Ref Range   WBC 13.7 (H) 4.0 - 10.5 K/uL   RBC 4.46 3.87 - 5.11 MIL/uL   Hemoglobin 12.0 12.0 - 15.0 g/dL   HCT 36.2 36.0 - 46.0 %   MCV 81.2 78.0 - 100.0 fL   MCH 26.9 26.0 - 34.0 pg   MCHC 33.1 30.0 - 36.0 g/dL   RDW 15.7 (H) 11.5 - 15.5 %   Platelets 295 150 - 400 K/uL   Neutrophils Relative % 69 43 - 77 %   Neutro Abs 9.5 (H) 1.7 - 7.7 K/uL   Lymphocytes Relative 22 12 - 46 %   Lymphs Abs 3.0 0.7 - 4.0 K/uL   Monocytes Relative 5 3 - 12 %   Monocytes Absolute 0.7 0.1 - 1.0 K/uL   Eosinophils Relative 4 0 - 5 %   Eosinophils Absolute 0.5 0.0 - 0.7 K/uL   Basophils Relative 0 0 - 1 %   Basophils Absolute 0.0 0.0 - 0.1 K/uL    Laboratory interpretation all normal except hypokalemia, mild worsening of her chronic renal insufficiency, insignificantly elevated BNP, leukocytosis   Imaging Review Dg Chest Port 1 View  03/05/2015   CLINICAL DATA:  Worsening shortness of breath for 2 days. Severely shortness of breath tonight.  EXAM: PORTABLE CHEST - 1 VIEW  COMPARISON:  01/21/2014  FINDINGS: Lung volumes are low. Bibasilar evaluation is limited by technique and soft tissue attenuation from body habitus. The heart is enlarged. There is vascular congestion and probable pulmonary edema. Question bibasilar opacities. There is no pneumothorax. Limited assessment for pleural effusion.  IMPRESSION: Low lung volumes with vascular congestion and probable pulmonary edema. Bibasilar assessment is limited by technique and soft tissue attenuation, question ill-defined bibasilar opacities. This may be related to atelectasis, pneumonia, versus pleural effusions, however frontal and lateral views are recommended for further evaluation when patient is able.   Electronically Signed   By: Jeb Levering M.D.   On: 03/05/2015 02:32     EKG Interpretation   Date/Time:  Friday March 05 2015 01:59:16 EDT Ventricular Rate:   77 PR Interval:  136 QRS Duration: 92 QT Interval:  431 QTC Calculation: 488 R Axis:   45 Text Interpretation:  Sinus rhythm Anterolateral infarct, age  indeterminate No significant change since last tracing 21 Jan 2014  Confirmed by [facility]  MD-I, Maryann Mccall ([id number]) on 03/05/2015 2:27:24 AM      MDM   Final diagnoses:  COPD exacerbation  Renal insufficiency  Hypokalemia  Hypoxia     Plan admission  Rolland Porter, MD, FACEP    CRITICAL CARE Performed by: Rolland Porter L Total critical care time: 35 min Critical care time was exclusive of separately billable procedures and treating other patients. Critical care was necessary to treat or prevent imminent or life-threatening deterioration. Critical care was time spent personally by me on the following activities: development of treatment plan with patient and/or surrogate as well as nursing, discussions with consultants, evaluation of patient's response to treatment, examination of patient, obtaining history from patient or surrogate, ordering and performing treatments and interventions, ordering and review of laboratory studies, ordering and review of radiographic studies, pulse oximetry and re-evaluation of patient's condition.    Rolland Porter, MD 03/05/15 [phone]

## 2015-03-05 NOTE — ED Notes (Signed)
Sob since Tuesday, ems gave duoneb and 125 mg solumedrol, 324 aspirin en route.  Pt also c/o chest pressure through the week.

## 2015-03-05 NOTE — Care Management Note (Addendum)
    Page 1 of 1   03/09/2015     2:17:12 PM CARE MANAGEMENT NOTE 03/09/2015  Patient:  ALISSAH, DUNCANSON   Account Number:  [id number]  Date Initiated:  03/05/2015  Documentation initiated by:  Theophilus Kinds  Subjective/Objective Assessment:   Pt admitted from home with CHF. Pt lives alone and will return home at discharge. Pt is independent with ADL's.  Pt stated that she has scales and weighs herself daily.     Action/Plan:   Nutritional consult placed for diet education. Will need to assess need for home O2 prior to discharge. If qualifies can arrange with any DME company.   Anticipated DC Date:  03/08/2015   Anticipated DC Plan:  [place]  CM consult      Choice offered to / List presented to:             Status of service:  Completed, signed off Medicare Important Message given?  YES (If response is "NO", the following Medicare IM given date fields will be blank) Date Medicare IM given:  03/09/2015 Medicare IM given by:  Theophilus Kinds Date Additional Medicare IM given:   Additional Medicare IM given by:    Discharge Disposition:  HOME/SELF CARE  Per UR Regulation:    If discussed at Long Length of Stay Meetings, dates discussed:    Comments:  03/09/15 [place], RN BSN CM Pt discharged home today. Pt does not qualify for home O2. No Cm needs noted.  03/05/15 [place], RN BSN CM

## 2015-03-05 NOTE — Progress Notes (Addendum)
Addendum: Patient's CABG just found to be 460. Given this finding, will start gentle IV fluids. We'll increase sliding scale NovoLog to resistance scale-every 4 hours rather than every before meals and daily at bedtime. Will give 5 units of NPH now. Will change Lantus to 60 units daily at bedtime.      The patient is a 64 year old woman with a history of CAD-status post stenting, hypertension, diabetes mellitus, and COPD. She was admitted this morning by Dr. Marin Comment for shortness of breath. The patient was seen and examined. Her chart, vital signs, and laboratory studies were reviewed. Agree with current management with exception/additions below.  -The patient was taken off of Avapro by her nephrologist, Dr. Theador Hawthorne 2 days prior to admission because her creatinine had increased. -She also reported he increased her Lasix from 40 to 60 mg daily because of her worsening renal function. -We'll start sliding scale NovoLog and decrease Lantus to half of her usual dose in the setting of worsening renal function. Of note, she was given Solu-Medrol in the ED which would likely lead to increased CBGs. Will order CBGs every before meals and daily at bedtime. -Would favor restarting antibiotics in light of the chest x-ray findings. We'll order a 2 view chest x-ray tomorrow morning. -2-D echocardiogram pending.

## 2015-03-05 NOTE — ED Notes (Signed)
Patient assisted to bedside commode, tolerated well. SOB getting back upon bed. O2 at 87 88. After resting a few minutes O2 is at 93%.

## 2015-03-05 NOTE — H&P (Signed)
[facility] History and Physical  [name] [id number] DOB: August 16, 1951    PCP:   Wardell Honour, MD   Chief Complaint: SOB for the past 4-5 days.   HPI: [name] is an 64 y.o. female with hx of known CAD, s/p one stent placement many years ago, hx of HTN, DM2, HLD, prior tobacco abuse , COPD, hx of diastolic CHF diagnosied in 2014, CKD, presents to the ER with 4-5 days of SOB.  She had no fever, chills, coughs, or any chest pain.  She denied orthopnea, or PND, but admitted to having increase bilateral pedal edema.  EMS thought that she had COPD exacerbation, and reportedly, she was given IV Solumedrol.  She was given IV Lasix, along with nebs, and she felt better.  Antibioitic was given in the ER as well.  Her evaluation included a CXR which showed CHF, a BNP of only 292, and her Cr was 3.11, with baseline about 2.5, her K was 3.1.  Her EKG showed no ischemic changes.  Hopsitalist was asked to admit her for SOB, possible COPD exacerbation. She recently saw her nephrologist who recommended that she follow up with her cardiologist. Her initial troponin was negative.  She had an ECHO done, but it was 2 years ago, and at that time, her EF was 55-65 percent, with diastolic dysfx.     Rewiew of Systems:  Constitutional: Negative for malaise, fever and chills. No significant weight loss or weight gain Eyes: Negative for eye pain, redness and discharge, diplopia, visual changes, or flashes of light. ENMT: Negative for ear pain, hoarseness, nasal congestion, sinus pressure and sore throat. No headaches; tinnitus, drooling, or problem swallowing. Cardiovascular: Negative for chest pain, palpitations, diaphoresis,  and peripheral edema.  Respiratory: Negative for cough, hemoptysis, wheezing and stridor. No pleuritic chestpain. Gastrointestinal: Negative for nausea, vomiting, diarrhea, constipation, abdominal pain, melena, blood in stool, hematemesis, jaundice and rectal bleeding.     Genitourinary: Negative for frequency, dysuria, incontinence,flank pain and hematuria; Musculoskeletal: Negative for back pain and neck pain. Negative for swelling and trauma.;  Skin: . Negative for pruritus, rash, abrasions, bruising and skin lesion.; ulcerations Neuro: Negative for headache, lightheadedness and neck stiffness. Negative for weakness, altered level of consciousness , altered mental status, extremity weakness, burning feet, involuntary movement, seizure and syncope.  Psych: negative for anxiety, depression, insomnia, tearfulness, panic attacks, hallucinations, paranoia, suicidal or homicidal ideation    Past Medical History  Diagnosis Date  . Esophageal stricture   . Hyperlipidemia   . Hypertension   . Bronchitis   . CAD (coronary artery disease)     s/p Stenting in 2005;  [place] 7/09: Vigorous LV function, 2-3+ MR, RI 40%, RI stent patent, proximal-mid RCA 40-50%;  Echo 7/09:  EF 55-65%, trivial MR;  [facility] 11/18/12: EF 66%, normal LV wall motion, mild to moderate anterior ischemia - reviewed by [facility] and felt to rep breast atten (low risk);  Echo 6/14: mild LVH, EF 55-65%, Gr 2 DD, PASP 44, trivial eff   . Arthritis   . Non-insulin dependent diabetes mellitus   . Diverticulosis   . Diabetic neuropathy   . Vitamin D deficiency   . Degenerative joint disease     left shoulder  . Chronic neck pain   . Shortness of breath     at night  . Headache(784.0)   . Anemia     hx of  . Stroke     "mini- years ago"  . Chronic kidney  disease     CREATININE IS UP--GOING TO KIDNEY MD   . CHF (congestive heart failure)     Past Surgical History  Procedure Laterality Date  . Bladder surgery      Tack  . Partial hysterectomy    . Shoulder surgery Right     Rotator cuff  . Cervical spine surgery  2012    8 screws  . Abdominal hysterectomy    . Laminectomy  12/29/2011  . Cardiac stents    . Lumbar laminectomy/decompression microdiscectomy  12/29/2011    Procedure: LUMBAR  LAMINECTOMY/DECOMPRESSION MICRODISCECTOMY;  Surgeon: Floyce Stakes, MD;  Location: [place] [facility];  Service: Neurosurgery;  Laterality: N/A;  Lumbar Two through Lumbar Five Laminectomies/Cellsaver  . Back surgery      2007 & 2013  . Cystoscopy with injection N/A 06/03/2013    Procedure: MACROPLASTIQUE  INJECTION ;  Surgeon: Malka So, MD;  Location: [facility];  Service: Urology;  Laterality: N/A;  . Cardiac catheterization  2003, 2005, 2009    1 stent  . Cataract extraction w/phaco Right 05/04/2014    Procedure: CATARACT EXTRACTION PHACO AND INTRAOCULAR LENS PLACEMENT (IOC);  Surgeon: Williams Che, MD;  Location: [facility];  Service: Ophthalmology;  Laterality: Right;  CDE 1.47  . Cataract extraction w/phaco Left 07/20/2014    Procedure: CATARACT EXTRACTION WITH PHACO AND INTRAOCULAR LENS PLACEMENT LEFT EYE CDE=1.79;  Surgeon: Williams Che, MD;  Location: [facility];  Service: Ophthalmology;  Laterality: Left;    Medications:  HOME MEDS: Prior to Admission medications   Medication Sig Start Date End Date Taking? Authorizing Provider  aspirin 81 MG EC tablet Take 81 mg by mouth daily.      Historical Provider, MD  atenolol (TENORMIN) 50 MG tablet TAKE (1) TABLET TWICE DAILY. 10/01/14   Wardell Honour, MD  budesonide-formoterol [facility]) 160-4.5 MCG/ACT inhaler Inhale 2 puffs into the lungs 2 (two) times daily as needed (shortness of breath).    Historical Provider, MD  cholecalciferol (VITAMIN D) 1000 UNITS tablet Take 1,000 Units by mouth every morning.    Historical Provider, MD  CRESTOR 20 MG tablet TAKE 1 TABLET IN THE EVENING. 02/09/15   Wardell Honour, MD  furosemide (LASIX) 40 MG tablet TAKE 1 TABLET ONCE DAILY. TAKE AN EXTRA TABLET IF WEIGHT IS ABOVE 3LBS. 10/01/14   Wardell Honour, MD  gabapentin (NEURONTIN) 300 MG capsule TAKE 1 CAPSULE 3 TIMES DAILY. 02/09/15   Wardell Honour, MD  insulin detemir (LEVEMIR) 100 UNIT/ML injection Inject 0.6 mLs (60 Units total) into the skin at  bedtime. 12/02/14   Wardell Honour, MD  Insulin Glargine (LANTUS) 100 UNIT/ML Solostar Pen Inject 72 Units into the skin daily at 10 pm. 01/12/15   Wardell Honour, MD  insulin lispro (HUMALOG) 100 UNIT/ML injection Inject 0.1-0.13 mLs (10-13 Units total) into the skin 3 (three) times daily with meals. To be administered as directed per sliding scale: 90-150= 10  Units 151-200=11 units 201-250=12 units 251-300= 13 units  To call MD if levels are between 251-300 for three consecutive days 12/03/14   Wardell Honour, MD  NIFEdipine (PROCARDIA XL/ADALAT-CC) 90 MG 24 hr tablet Take 90 mg by mouth every evening.     Historical Provider, MD  potassium chloride (K-DUR) 10 MEQ tablet Take 10 mEq by mouth daily.  04/17/14   Historical Provider, MD  valsartan (DIOVAN) 320 MG tablet Take 1 tablet (320 mg total) by mouth daily. 01/12/15  Wardell Honour, MD     Allergies:  Allergies  Allergen Reactions  . Ace Inhibitors Other (See Comments)    cough  . Clopidogrel Bisulfate     REACTION: Sick, Headache, "Felt terrible"  . Codeine Other (See Comments)    REACTION: hallucinations  . Lisinopril Other (See Comments)    cough  . Penicillins     unknown    Social History:   reports that she quit smoking about 13 years ago. She has never used smokeless tobacco. She reports that she drinks alcohol. She reports that she does not use illicit drugs.  Family History: Family History  Problem Relation Age of Onset  . Heart disease Other      Physical Exam: Filed Vitals:   03/05/15 0430 03/05/15 0500 03/05/15 0504 03/05/15 0509  BP: 146/64 136/65  136/65  Pulse: 85 82  84  Temp:      TempSrc:      Resp:    22  Height:      Weight:      SpO2: 92% 96% 97% 96%   Blood pressure 136/65, pulse 84, temperature 98 F (36.7 C), temperature source Oral, resp. rate 22, height 5\' 2"  (1.575 m), weight 99.791 kg (220 lb), SpO2 96 %.  GEN:  Pleasant  patient lying in the stretcher in no acute distress;  cooperative with exam. PSYCH:  alert and oriented x4; does not appear anxious or depressed; affect is appropriate. HEENT: Mucous membranes pink and anicteric; PERRLA; EOM intact; no cervical lymphadenopathy nor thyromegaly or carotid bruit; no JVD; There were no stridor. Neck is very supple. Breasts:: Not examined CHEST WALL: No tenderness CHEST: minimally wheezing, with bilateral rales, right greater than left.  HEART: Regular rate and rhythm.  There are no murmur, rub, or gallops.   BACK: No kyphosis or scoliosis; no CVA tenderness ABDOMEN: soft and non-tender; no masses, no organomegaly, normal abdominal bowel sounds; no pannus; no intertriginous candida. There is no rebound and no distention. Rectal Exam: Not done EXTREMITIES: No bone or joint deformity; age-appropriate arthropathy of the hands and knees; 1-2+ edema, with  no ulcerations.  There is no calf tenderness. Genitalia: not examined PULSES: 2+ and symmetric SKIN: Normal hydration no rash or ulceration CNS: Cranial nerves 2-12 grossly intact no focal lateralizing neurologic deficit.  Speech is fluent; uvula elevated with phonation, facial symmetry and tongue midline. DTR are normal bilaterally, cerebella exam is intact, barbinski is negative and strengths are equaled bilaterally.  No sensory loss.   Labs on Admission:  Basic Metabolic Panel:  Recent Labs Lab 03/05/15 0221  NA 141  K 3.1*  CL 105  CO2 27  GLUCOSE 162*  BUN 41*  CREATININE 3.11*  CALCIUM 8.9   Liver Function Tests:  Recent Labs Lab 03/05/15 0221  AST 38*  ALT 34  ALKPHOS 62  BILITOT 0.5  PROT 6.6  ALBUMIN 3.3*    Recent Labs Lab 03/05/15 0221  WBC 13.7*  NEUTROABS 9.5*  HGB 12.0  HCT 36.2  MCV 81.2  PLT 295   Cardiac Enzymes:  Recent Labs Lab 03/05/15 0221  TROPONINI <0.03    CBG: No results for input(s): GLUCAP in the last 168 hours.   Radiological Exams on Admission: Dg Chest Port 1 View  03/05/2015   CLINICAL DATA:   Worsening shortness of breath for 2 days. Severely shortness of breath tonight.  EXAM: PORTABLE CHEST - 1 VIEW  COMPARISON:  01/21/2014  FINDINGS: Lung volumes are low. Bibasilar  evaluation is limited by technique and soft tissue attenuation from body habitus. The heart is enlarged. There is vascular congestion and probable pulmonary edema. Question bibasilar opacities. There is no pneumothorax. Limited assessment for pleural effusion.  IMPRESSION: Low lung volumes with vascular congestion and probable pulmonary edema. Bibasilar assessment is limited by technique and soft tissue attenuation, question ill-defined bibasilar opacities. This may be related to atelectasis, pneumonia, versus pleural effusions, however frontal and lateral views are recommended for further evaluation when patient is able.   Electronically Signed   By: Jeb Levering M.D.   On: 03/05/2015 02:32    EKG: Independently reviewed. NSR with no acute ST T changes.    Assessment/Plan Present on Admission:  . Acute on chronic diastolic CHF (congestive heart failure) . Coronary atherosclerosis . Dyspnea . HYPERTENSION, BENIGN . Hyperlipidemia . Acute on chronic diastolic congestive heart failure  PLAN:  I think her SOB is mostly acute on chronic diastolic CHF.   I think her COPD exacerbation played a much smaller role.  WIll continue with IV Lasix, and continue with her neb tx and antibiotics, but will d/c her steroids.  She does have significant dyslipidemia, and will continue with her crestor.  She has acute on chronic kidney disease, and therefore, I will decrease her Valsartan dose, avoid nephrotoxic drugs, and follow her Cr carefully.  For her diastolic heart failure, will continue with betablocker and Nifedipines, along with IV Lasix.  Will obtain an ECHO.  She is stable, full code, and will be admitted to [facility] service.  Thank you for allowing me to participate in her care.   Other plans as per orders.  Code Status: FULL  Haskel Khan, MD. [facility] Pager [phone] 7pm to 7am.  03/05/2015, 6:02 AM

## 2015-03-05 NOTE — Plan of Care (Signed)
Problem: Food- and Nutrition-Related Knowledge Deficit (NB-1.1) Goal: Nutrition education Formal process to instruct or train a patient/client in a skill or to impart knowledge to help patients/clients voluntarily manage or modify food choices and eating behavior to maintain or improve health. Outcome: Completed/Met Date Met:  03/05/15 Nutrition Education Note  RD consulted for Renal/CHF/ Diabetic Diet Education. Provided "Plate Method" and "CKD stage 5 nutrition therapy" from the nutrition care manualy. Briefly talked about CHF diet. She currently is not on fluid restriction. Let her knew that if she follows the renal diet she should be following the CHF diet. Reminded her to weigh herself daily and watch for weight changes of 3+/- pounds and adjust fluid intake accordingly.Stick to fresh/frozen and avoid canned (unless washed), lunch meats, soups, certain dressings, fried foods etc  For Renal, We discussed why diet restrictions are needed and provided lists of foods to limit/avoid that are high potassium, and phosphorus. Because patient is also diabetic we talked about following the "plate method", but without the foods that are high in sodium, potassium, and phosphorus. We highlighted specific foods in her diet that would be problematic ie. ice cream, pinto beans, tomatoes, spinach, etc. We talked about how to leach potatoes. Provided specific recommendations on safer alternatives of these foods. Strongly encouraged compliance of this diet. Educated patient that she should never eat carbohydrates by themselves unless hypoglycemic and to maintain a STEADY intake of carbs alongside proteins/fats throughout day. Patient stated that her nephrologist had told her that if she reduces her protein she can prolong the need for dialysis. Told her to follow what her doctor says and if that is the case, substitute some of her protein servings for Renal friendly vegetable servings  Encouraged pt to discuss specific  DM diet questions/concerns with RD at DM outpatient facility. Gave her my number if she had questions regarding the renal diet.  Expect good compliance.-Patient seems motivated and has family support  Body mass index is 40.37 kg/(m^2). Pt meets criteria for morbidly obese based on current BMI.  Current diet order is Carb control/Heart Healthy, patient is consuming approximately 75% of meals at this time. Labs and medications reviewed. No further nutrition interventions warranted at this time. RD contact information provided. If additional nutrition issues arise, please re-consult RD.  Burtis Junes RD, LDN Nutrition Pager: [phone] 03/05/2015 4:35 PM

## 2015-03-05 NOTE — Plan of Care (Signed)
Problem: Phase I Progression Outcomes Goal: EF % per last Echo/documented,Core Reminder form on chart Outcome: Progressing Order has been placed

## 2015-03-05 NOTE — Progress Notes (Signed)
UR chart review completed.  

## 2015-03-05 NOTE — ED Notes (Signed)
Ambulated patient around nursing station with pulse ox. O2 sat ranged from 87 to 91 percent room air. Returned patient to room respirations 26 heart rate of 101, O2 sat was 88 percent.

## 2015-03-06 ENCOUNTER — Inpatient Hospital Stay (HOSPITAL_COMMUNITY): Payer: Medicare Other

## 2015-03-06 ENCOUNTER — Encounter (HOSPITAL_COMMUNITY): Payer: Self-pay | Admitting: Internal Medicine

## 2015-03-06 DIAGNOSIS — E876 Hypokalemia: Secondary | ICD-10-CM

## 2015-03-06 DIAGNOSIS — Z6830 Body mass index (BMI) 30.0-30.9, adult: Secondary | ICD-10-CM | POA: Diagnosis present

## 2015-03-06 DIAGNOSIS — I313 Pericardial effusion (noninflammatory): Secondary | ICD-10-CM

## 2015-03-06 DIAGNOSIS — N184 Chronic kidney disease, stage 4 (severe): Secondary | ICD-10-CM

## 2015-03-06 DIAGNOSIS — I3139 Other pericardial effusion (noninflammatory): Secondary | ICD-10-CM

## 2015-03-06 DIAGNOSIS — J440 Chronic obstructive pulmonary disease with acute lower respiratory infection: Secondary | ICD-10-CM

## 2015-03-06 DIAGNOSIS — J441 Chronic obstructive pulmonary disease with (acute) exacerbation: Secondary | ICD-10-CM | POA: Diagnosis present

## 2015-03-06 DIAGNOSIS — J449 Chronic obstructive pulmonary disease, unspecified: Secondary | ICD-10-CM | POA: Diagnosis present

## 2015-03-06 HISTORY — DX: Other pericardial effusion (noninflammatory): I31.39

## 2015-03-06 HISTORY — DX: Pericardial effusion (noninflammatory): I31.3

## 2015-03-06 LAB — CBC
HCT: 31.3 % — ABNORMAL LOW (ref 36.0–46.0)
Hemoglobin: 10.2 g/dL — ABNORMAL LOW (ref 12.0–15.0)
MCH: 26.7 pg (ref 26.0–34.0)
MCHC: 32.6 g/dL (ref 30.0–36.0)
MCV: 81.9 fL (ref 78.0–100.0)
Platelets: 291 10*3/uL (ref 150–400)
RBC: 3.82 MIL/uL — ABNORMAL LOW (ref 3.87–5.11)
RDW: 16.1 % — ABNORMAL HIGH (ref 11.5–15.5)
WBC: 17.9 10*3/uL — ABNORMAL HIGH (ref 4.0–10.5)

## 2015-03-06 LAB — RENAL FUNCTION PANEL
Albumin: 2.9 g/dL — ABNORMAL LOW (ref 3.5–5.2)
Anion gap: 10 (ref 5–15)
BUN: 45 mg/dL — ABNORMAL HIGH (ref 6–23)
CO2: 26 mmol/L (ref 19–32)
Calcium: 8.5 mg/dL (ref 8.4–10.5)
Chloride: 105 mmol/L (ref 96–112)
Creatinine, Ser: 3.37 mg/dL — ABNORMAL HIGH (ref 0.50–1.10)
GFR calc Af Amer: 16 mL/min — ABNORMAL LOW (ref >90)
GFR calc non Af Amer: 14 mL/min — ABNORMAL LOW (ref >90)
Glucose, Bld: 136 mg/dL — ABNORMAL HIGH (ref 70–99)
Phosphorus: 3.7 mg/dL (ref 2.3–4.6)
Potassium: 2.9 mmol/L — ABNORMAL LOW (ref 3.5–5.1)
Sodium: 141 mmol/L (ref 135–145)

## 2015-03-06 LAB — GLUCOSE, CAPILLARY
Glucose-Capillary: 156 mg/dL — ABNORMAL HIGH (ref 70–99)
Glucose-Capillary: 89 mg/dL (ref 70–99)
Glucose-Capillary: 59 mg/dL — ABNORMAL LOW (ref 70–99)
Glucose-Capillary: 139 mg/dL — ABNORMAL HIGH (ref 70–99)
Glucose-Capillary: 76 mg/dL (ref 70–99)
Glucose-Capillary: 185 mg/dL — ABNORMAL HIGH (ref 70–99)
Glucose-Capillary: 156 mg/dL — ABNORMAL HIGH (ref 70–99)
Glucose-Capillary: 245 mg/dL — ABNORMAL HIGH (ref 70–99)
Glucose-Capillary: 268 mg/dL — ABNORMAL HIGH (ref 70–99)

## 2015-03-06 MED ORDER — SENNA 8.6 MG PO TABS
1.0000 | ORAL_TABLET | Freq: Every day | ORAL | Status: DC
Start: 2015-03-06 — End: 2015-03-09
  Administered 2015-03-06 – 2015-03-08 (×3): 8.6 mg via ORAL
  Filled 2015-03-06 (×3): qty 1

## 2015-03-06 MED ORDER — INSULIN ASPART 100 UNIT/ML ~~LOC~~ SOLN
0.0000 [IU] | SUBCUTANEOUS | Status: DC
  Administered 2015-03-06: 3 [IU] via SUBCUTANEOUS
  Administered 2015-03-06: 5 [IU] via SUBCUTANEOUS
  Administered 2015-03-07: 3 [IU] via SUBCUTANEOUS
  Administered 2015-03-07: 2 [IU] via SUBCUTANEOUS
  Administered 2015-03-07: 11 [IU] via SUBCUTANEOUS
  Administered 2015-03-07: 5 [IU] via SUBCUTANEOUS
  Administered 2015-03-07: 8 [IU] via SUBCUTANEOUS
  Administered 2015-03-07: 5 [IU] via SUBCUTANEOUS
  Administered 2015-03-08: 11 [IU] via SUBCUTANEOUS
  Administered 2015-03-08: 5 [IU] via SUBCUTANEOUS
  Administered 2015-03-08: 3 [IU] via SUBCUTANEOUS
  Administered 2015-03-08 (×2): 5 [IU] via SUBCUTANEOUS
  Administered 2015-03-09: 8 [IU] via SUBCUTANEOUS
  Administered 2015-03-09: 2 [IU] via SUBCUTANEOUS

## 2015-03-06 MED ORDER — POTASSIUM CHLORIDE CRYS ER 20 MEQ PO TBCR
30.0000 meq | EXTENDED_RELEASE_TABLET | Freq: Two times a day (BID) | ORAL | Status: AC
  Administered 2015-03-06 (×2): 30 meq via ORAL
  Filled 2015-03-06 (×4): qty 1

## 2015-03-06 MED ORDER — ALBUTEROL SULFATE (2.5 MG/3ML) 0.083% IN NEBU
2.5000 mg | INHALATION_SOLUTION | Freq: Four times a day (QID) | RESPIRATORY_TRACT | Status: DC
  Administered 2015-03-06 – 2015-03-08 (×7): 2.5 mg via RESPIRATORY_TRACT
  Filled 2015-03-06 (×7): qty 3

## 2015-03-06 MED ORDER — TIOTROPIUM BROMIDE MONOHYDRATE 18 MCG IN CAPS
18.0000 ug | ORAL_CAPSULE | Freq: Every day | RESPIRATORY_TRACT | Status: DC
  Administered 2015-03-07 – 2015-03-09 (×3): 18 ug via RESPIRATORY_TRACT
  Filled 2015-03-06 (×2): qty 5

## 2015-03-06 MED ORDER — GABAPENTIN 100 MG PO CAPS
100.0000 mg | ORAL_CAPSULE | Freq: Three times a day (TID) | ORAL | Status: DC
  Administered 2015-03-06 – 2015-03-09 (×9): 100 mg via ORAL
  Filled 2015-03-06 (×9): qty 1

## 2015-03-06 MED ORDER — BISACODYL 10 MG RE SUPP: 10.0000 mg | Freq: Every day | RECTAL | Status: DC | PRN

## 2015-03-06 MED ORDER — PREDNISONE 20 MG PO TABS
40.0000 mg | ORAL_TABLET | Freq: Two times a day (BID) | ORAL | Status: DC
  Administered 2015-03-06 – 2015-03-07 (×3): 40 mg via ORAL
  Filled 2015-03-06 (×4): qty 2

## 2015-03-06 MED ORDER — POLYETHYLENE GLYCOL 3350 17 G PO PACK
17.0000 g | PACK | Freq: Every day | ORAL | Status: DC
  Administered 2015-03-06 – 2015-03-09 (×4): 17 g via ORAL
  Filled 2015-03-06 (×4): qty 1

## 2015-03-06 NOTE — Progress Notes (Addendum)
[facility] PROGRESS NOTE  [name] [id number] DOB: December 03, 1951 DOA: 03/05/2015 PCP: Wardell Honour, MD Nephrologist: Dr. Theador Hawthorne    Code Status: Full code Family Communication: No family available Disposition Plan: Discharge to home when clinically improved.   Consultants:  Nephrology pending  Procedures:  2-D echocardiogram 03/05/15: Study Conclusions - Left ventricle: The cavity size was normal. There was mild concentric hypertrophy. Systolic function was normal. The estimated ejection fraction was in the range of 60% to 65%. Wall motion was normal; there were no regional wall motion abnormalities. Features are consistent with a pseudonormal left ventricular filling pattern, with concomitant abnormal relaxation and increased filling pressure (grade 2 diastolic dysfunction). Doppler parameters are consistent with high ventricular filling pressure. - Mitral valve: Mildly thickened leaflets . There was mild regurgitation. - Left atrium: The atrium was moderately dilated. Volume/bsa, ES, (1-plane Simpson&'s, A2C): 37.8 ml/m^2. - Right atrium: The atrium was mildly dilated. - Pericardium, extracardiac: A very small pericardial effusion was seen. No evidence of tamponade physiology.  Antibiotics:  Levaquin 4/1>>  HPI/Subjective: The patient says that she is not feeling much better. She has some chest tightness that comes and goes and is and is not associated with a cough. She feels as if she is less swollen.  Objective: Filed Vitals:   03/06/15 0634  BP: 132/68  Pulse: 72  Temp: 97.8 F (36.6 C)  Resp: 20   oxygen saturation 95% on supplemental oxygen.  Intake/Output Summary (Last 24 hours) at 03/06/15 1509 Last data filed at 03/06/15 1301  Gross per 24 hour  Intake 2106.84 ml  Output   3400 ml  Net -1293.16 ml   Filed Weights   03/05/15 0152 03/05/15 0650 03/06/15 0634  Weight: 99.791 kg (220 lb) 100.154 kg (220 lb 12.8  oz) 98.93 kg (218 lb 1.6 oz)    Exam:   General:  Obese 64 year old African-American woman, in no acute distress.  Cardiovascular: S1, S2, with a soft systolic murmur.  Respiratory: Rare fine wheezes in the mid lobes; breathing nonlabored at rest.  Abdomen: Obese, positive bowel sounds, soft, nontender, nondistended.  Musculoskeletal/extremities: Trace of pedal edema bilaterally. No acute hot red joints.   Neurologic: She is alert and oriented 3. Cranial nerves are intact.  Data Reviewed: Basic Metabolic Panel:  Recent Labs Lab 03/05/15 0221 03/05/15 0848 03/05/15 1735 03/06/15 0623  NA 141  --   --  141  K 3.1*  --   --  2.9*  CL 105  --   --  105  CO2 27  --   --  26  GLUCOSE 162*  --  380* 136*  BUN 41*  --   --  45*  CREATININE 3.11* 3.26*  --  3.37*  CALCIUM 8.9  --   --  8.5  PHOS  --   --   --  3.7   Liver Function Tests:  Recent Labs Lab 03/05/15 0221 03/06/15 0623  AST 38*  --   ALT 34  --   ALKPHOS 62  --   BILITOT 0.5  --   PROT 6.6  --   ALBUMIN 3.3* 2.9*   No results for input(s): LIPASE, AMYLASE in the last 168 hours. No results for input(s): AMMONIA in the last 168 hours. CBC:  Recent Labs Lab 03/05/15 0221 03/05/15 0848 03/06/15 0623  WBC 13.7* 12.7* 17.9*  NEUTROABS 9.5*  --   --   HGB 12.0 10.8* 10.2*  HCT 36.2 32.2* 31.3*  MCV 81.2  81.1 81.9  PLT 295 281 291   Cardiac Enzymes:  Recent Labs Lab 03/05/15 0221 03/05/15 0848 03/05/15 1314 03/05/15 1905  TROPONINI <0.03 <0.03 <0.03 <0.03   BNP (last 3 results)  Recent Labs  03/05/15 0214  BNP 292.0*    ProBNP (last 3 results) No results for input(s): PROBNP in the last 8760 hours.  CBG:  Recent Labs Lab 03/06/15 0121 03/06/15 0509 03/06/15 0616 03/06/15 0741 03/06/15 1137  GLUCAP 89 59* 139* 76 185*    No results found for this or any previous visit (from the past 240 hour(s)).   Studies: Dg Chest 2 View  03/06/2015   CLINICAL DATA:  Followup CHF.   EXAM: CHEST  2 VIEW  COMPARISON:  03/05/2015  FINDINGS: Since prior exam, lung aeration has improved. Minimal interstitial thickening persists mostly in the lower lungs. There is no lung consolidation. There are minimal posterior lung base pleural effusions.  Cardiac silhouette is mildly enlarged. No mediastinal or hilar masses or evidence of adenopathy.  IMPRESSION: Significantly improved congestive heart failure. No new abnormalities. No evidence of pneumonia.   Electronically Signed   By: Lajean Manes M.D.   On: 03/06/2015 09:57   Dg Chest Port 1 View  03/05/2015   CLINICAL DATA:  Worsening shortness of breath for 2 days. Severely shortness of breath tonight.  EXAM: PORTABLE CHEST - 1 VIEW  COMPARISON:  01/21/2014  FINDINGS: Lung volumes are low. Bibasilar evaluation is limited by technique and soft tissue attenuation from body habitus. The heart is enlarged. There is vascular congestion and probable pulmonary edema. Question bibasilar opacities. There is no pneumothorax. Limited assessment for pleural effusion.  IMPRESSION: Low lung volumes with vascular congestion and probable pulmonary edema. Bibasilar assessment is limited by technique and soft tissue attenuation, question ill-defined bibasilar opacities. This may be related to atelectasis, pneumonia, versus pleural effusions, however frontal and lateral views are recommended for further evaluation when patient is able.   Electronically Signed   By: Jeb Levering M.D.   On: 03/05/2015 02:32    Scheduled Meds: . albuterol  2.5 mg Nebulization TID  . antiseptic oral rinse  7 mL Mouth Rinse BID  . aspirin EC  81 mg Oral Daily  . atenolol  50 mg Oral Daily  . cholecalciferol  1,000 Units Oral q morning - 10a  . furosemide  20 mg Intravenous Q12H  . gabapentin  300 mg Oral TID  . heparin  5,000 Units Subcutaneous 3 times per day  . insulin aspart  0-20 Units Subcutaneous 6 times per day  . insulin glargine  60 Units Subcutaneous Q2200  .  levofloxacin (LEVAQUIN) IV  250 mg Intravenous Q24H  . NIFEdipine  90 mg Oral QPM  . polyethylene glycol  17 g Oral Daily  . potassium chloride  30 mEq Oral BID  . rosuvastatin  20 mg Oral Daily  . senna  1 tablet Oral QHS   Continuous Infusions:   Assessment and plan:  Principal Problem:   Acute on chronic diastolic CHF (congestive heart failure) Active Problems:   Bronchitis, chronic obstructive w acute bronchitis   Type II diabetes mellitus with nephropathy   Hyperlipidemia   HYPERTENSION, BENIGN   Coronary atherosclerosis   Hypokalemia   Leukocytosis   Chronic kidney disease, stage IV (severe)   Morbid obesity    1. Acute on chronic diastolic congestive heart failure. The patient was started on IV Lasix every 12 hours. The dose was decreased due to the patient's  hyperglycemia and a concern for dehydration. She was maintained on her beta blocker and calcium channel blocker. Her in's/outs are -900 cc. Her weight is down approximately 1 pound. Of note, she was given gentle IV fluids when her CBG increased to greater than 450. Her 2-D echo revealed an EF of 60-65% and grade 2 diastolic dysfunction. Her TSH was within normal limits. The patient appears to be less edematous and her follow-up chest x-ray on 03/06/15 reveal less pulmonary edema. We'll continue Lasix IV at 20 mg every 12 hours with caution given her rising creatinine.  Possible concomitant chronic obstructive bronchitis; acute on chronic. The patient was given IV Solu-Medrol in the ED, but it was discontinued as it was thought that her dyspnea was more from CHF than COPD. Her chest x-ray reveals some peripheral interstitial thickening which may reflect bronchitis. IV Levaquin was started. She was started on albuterol nebulizer 3 times daily and Symbicort. Will increase the frequency of the nebulizer to every 6 hours. We'll Spiriva. Will restart steroid therapy with prednisone and try to taper quickly. We'll continue oxygen  therapy as needed.  Hypertension. She was restarted on Procardia and atenolol. Her blood pressures are reasonable.  Chest tightness. This could be secondary to CHF and acute bronchitis. The echocardiogram did note a very small pericardial effusion which could be possibly playing a role. Left ventricular wall motion was normal. Her troponin I was negative 3. Will treat her pain with analgesics as ordered. NSAID was considered, but is being held in light of her compromised renal function.  Stage III to stage IV chronic kidney disease. The patient is followed by Dr. Theador Hawthorne. Several days ago, he stopped her Avapro and increased her Lasix as her creatinine had apparently increased in the outpatient setting. She was told that she has stage III or stage IV chronic kidney disease. Her creatinine on admission was 3.11. It has increased to 3.37. Her urine output appears to be nonoliguric. We'll continue IV Lasix 20 mg every 12 hours. Will ask nephrology to consult and make recommendations regarding management.  Hypokalemia. This is likely secondary to Lasix. Will supplement orally and monitor. Will check a magnesium level tomorrow morning.  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 The patient is treated with Lantus and Humalog chronically. Her blood glucose reached to greater than 450 following IV site and Medrol given in the ED. She was treated accordingly with titrating the insulin-now given every 4 hours rather than every before meals and daily at bedtime. She was also briefly given gentle IV fluids. Her CBGs are better. Hemoglobin A1c is pending. -We'll decrease gabapentin to 100 mg 3 times a day per recommendation by pharmacy.  Leukocytosis: This is likely from a combination of acute bronchitis and steroid therapy. She is afebrile. We'll continue to monitor.   Time spent: 40 minutes    [facility] Pager [phone]. If 7PM-7AM,  please contact night-coverage at [email or web address], password [facility] 03/06/2015, 3:09 PM  LOS: 1 day

## 2015-03-07 ENCOUNTER — Inpatient Hospital Stay (HOSPITAL_COMMUNITY): Payer: Medicare Other

## 2015-03-07 DIAGNOSIS — E1121 Type 2 diabetes mellitus with diabetic nephropathy: Secondary | ICD-10-CM

## 2015-03-07 DIAGNOSIS — I319 Disease of pericardium, unspecified: Secondary | ICD-10-CM

## 2015-03-07 LAB — GLUCOSE, CAPILLARY
Glucose-Capillary: 130 mg/dL — ABNORMAL HIGH (ref 70–99)
Glucose-Capillary: 188 mg/dL — ABNORMAL HIGH (ref 70–99)
Glucose-Capillary: 220 mg/dL — ABNORMAL HIGH (ref 70–99)
Glucose-Capillary: 226 mg/dL — ABNORMAL HIGH (ref 70–99)
Glucose-Capillary: 256 mg/dL — ABNORMAL HIGH (ref 70–99)
Glucose-Capillary: 307 mg/dL — ABNORMAL HIGH (ref 70–99)
Glucose-Capillary: 342 mg/dL — ABNORMAL HIGH (ref 70–99)

## 2015-03-07 LAB — RENAL FUNCTION PANEL
Albumin: 3 g/dL — ABNORMAL LOW (ref 3.5–5.2)
Anion gap: 8 (ref 5–15)
BUN: 51 mg/dL — ABNORMAL HIGH (ref 6–23)
CO2: 25 mmol/L (ref 19–32)
Calcium: 8.6 mg/dL (ref 8.4–10.5)
Chloride: 107 mmol/L (ref 96–112)
Creatinine, Ser: 3.56 mg/dL — ABNORMAL HIGH (ref 0.50–1.10)
GFR calc Af Amer: 15 mL/min — ABNORMAL LOW (ref >90)
GFR calc non Af Amer: 13 mL/min — ABNORMAL LOW (ref >90)
Glucose, Bld: 184 mg/dL — ABNORMAL HIGH (ref 70–99)
Phosphorus: 3.5 mg/dL (ref 2.3–4.6)
Potassium: 4.1 mmol/L (ref 3.5–5.1)
Sodium: 140 mmol/L (ref 135–145)

## 2015-03-07 LAB — CBC
HCT: 33 % — ABNORMAL LOW (ref 36.0–46.0)
Hemoglobin: 10.8 g/dL — ABNORMAL LOW (ref 12.0–15.0)
MCH: 26.9 pg (ref 26.0–34.0)
MCHC: 32.7 g/dL (ref 30.0–36.0)
MCV: 82.1 fL (ref 78.0–100.0)
Platelets: 311 10*3/uL (ref 150–400)
RBC: 4.02 MIL/uL (ref 3.87–5.11)
RDW: 16 % — ABNORMAL HIGH (ref 11.5–15.5)
WBC: 16.1 10*3/uL — ABNORMAL HIGH (ref 4.0–10.5)

## 2015-03-07 LAB — IRON AND TIBC
Iron: 64 ug/dL (ref 42–145)
Saturation Ratios: 24 % (ref 20–55)
TIBC: 265 ug/dL (ref 250–470)
UIBC: 201 ug/dL (ref 125–400)

## 2015-03-07 LAB — VITAMIN B12: Vitamin B-12: 394 pg/mL (ref 211–911)

## 2015-03-07 LAB — FERRITIN: Ferritin: 155 ng/mL (ref 10–291)

## 2015-03-07 MED ORDER — SODIUM CHLORIDE 0.9 % IJ SOLN
3.0000 mL | Freq: Two times a day (BID) | INTRAMUSCULAR | Status: DC
  Administered 2015-03-07 – 2015-03-09 (×5): 3 mL via INTRAVENOUS

## 2015-03-07 MED ORDER — ALUM & MAG HYDROXIDE-SIMETH 200-200-20 MG/5ML PO SUSP
15.0000 mL | Freq: Once | ORAL | Status: AC
  Administered 2015-03-08: 15 mL via ORAL
  Filled 2015-03-07: qty 30

## 2015-03-07 MED ORDER — LEVOFLOXACIN 500 MG PO TABS
500.0000 mg | ORAL_TABLET | ORAL | Status: DC
  Administered 2015-03-08 – 2015-03-09 (×2): 500 mg via ORAL
  Filled 2015-03-07 (×2): qty 1

## 2015-03-07 MED ORDER — HYDRALAZINE HCL 20 MG/ML IJ SOLN
10.0000 mg | Freq: Once | INTRAMUSCULAR | Status: AC
  Administered 2015-03-07: 10 mg via INTRAVENOUS
  Filled 2015-03-07: qty 1

## 2015-03-07 MED ORDER — SODIUM CHLORIDE 0.9 % IV SOLN: 250.0000 mL | INTRAVENOUS | Status: DC | PRN

## 2015-03-07 MED ORDER — TORSEMIDE 20 MG PO TABS
40.0000 mg | ORAL_TABLET | Freq: Every day | ORAL | Status: DC
  Administered 2015-03-07 – 2015-03-09 (×3): 40 mg via ORAL
  Filled 2015-03-07 (×3): qty 2

## 2015-03-07 MED ORDER — SODIUM CHLORIDE 0.9 % IJ SOLN
3.0000 mL | INTRAMUSCULAR | Status: DC | PRN
  Administered 2015-03-07: 3 mL via INTRAVENOUS
  Filled 2015-03-07: qty 3

## 2015-03-07 NOTE — Consult Note (Signed)
Reason for Consult: Renal failure Referring Physician: Dr. [name] is an 64 y.o. female.  HPI: She is a patient was history of hypertension, coronary artery disease status post stent placement, history of diabetes, chronic renal failure stage IV presently came with complaints of difficulty breathing, some exertional dyspnea, and increasing edema. Presently patient states that she is feeling somewhat better but she has some difficulty breathing, chest tightness and wheezing this morning. She denies any nausea or vomiting. Her edema has improved.  Past Medical History  Diagnosis Date  . Esophageal stricture   . Hyperlipidemia   . Hypertension   . Bronchitis   . CAD (coronary artery disease)     s/p Stenting in 2005;  [place] 7/09: Vigorous LV function, 2-3+ MR, RI 40%, RI stent patent, proximal-mid RCA 40-50%;  Echo 7/09:  EF 55-65%, trivial MR;  [place] 11/18/12: EF 66%, normal LV wall motion, mild to moderate anterior ischemia - reviewed by [facility] and felt to rep breast atten (low risk);  Echo 6/14: mild LVH, EF 55-65%, Gr 2 DD, PASP 44, trivial eff   . Arthritis   . IDDM (insulin dependent diabetes mellitus)   . Diverticulosis   . Diabetic neuropathy   . Vitamin D deficiency   . Degenerative joint disease     left shoulder  . Chronic neck pain   . Shortness of breath     at night  . Headache(784.0)   . Anemia     hx of  . Stroke     "mini- years ago"  . Chronic kidney disease     CREATININE IS UP--GOING TO KIDNEY MD   . CHF (congestive heart failure)   . Pericardial effusion 03/06/2015    Very small per echo.  . Acute on chronic diastolic CHF (congestive heart failure) 03/05/2015    Past Surgical History  Procedure Laterality Date  . Bladder surgery      Tack  . Partial hysterectomy    . Shoulder surgery Right     Rotator cuff  . Cervical spine surgery  2012    8 screws  . Abdominal hysterectomy    . Laminectomy  12/29/2011  . Cardiac stents    . Lumbar  laminectomy/decompression microdiscectomy  12/29/2011    Procedure: LUMBAR LAMINECTOMY/DECOMPRESSION MICRODISCECTOMY;  Surgeon: Floyce Stakes, MD;  Location: [place] [facility];  Service: Neurosurgery;  Laterality: N/A;  Lumbar Two through Lumbar Five Laminectomies/Cellsaver  . Back surgery      2007 & 2013  . Cystoscopy with injection N/A 06/03/2013    Procedure: MACROPLASTIQUE  INJECTION ;  Surgeon: Malka So, MD;  Location: [facility] ORS;  Service: Urology;  Laterality: N/A;  . Cardiac catheterization  2003, 2005, 2009    1 stent  . Cataract extraction w/phaco Right 05/04/2014    Procedure: CATARACT EXTRACTION PHACO AND INTRAOCULAR LENS PLACEMENT (IOC);  Surgeon: Williams Che, MD;  Location: [facility]S;  Service: Ophthalmology;  Laterality: Right;  CDE 1.47  . Cataract extraction w/phaco Left 07/20/2014    Procedure: CATARACT EXTRACTION WITH PHACO AND INTRAOCULAR LENS PLACEMENT LEFT EYE CDE=1.79;  Surgeon: Williams Che, MD;  Location: [facility]S;  Service: Ophthalmology;  Laterality: Left;    Family History  Problem Relation Age of Onset  . Heart disease Other     Social History:  reports that she quit smoking about 13 years ago. She has never used smokeless tobacco. She reports that she drinks alcohol. She reports that she does  not use illicit drugs.  Allergies:  Allergies  Allergen Reactions  . Ace Inhibitors Other (See Comments)    cough  . Clopidogrel Bisulfate     REACTION: Sick, Headache, "Felt terrible"  . Codeine Other (See Comments)    REACTION: hallucinations  . Lisinopril Other (See Comments)    cough  . Penicillins     unknown    Medications: I have reviewed the patient's current medications.  Results for orders placed or performed during the hospital encounter of 03/05/15 (from the past 48 hour(s))  Troponin I     Status: None   Collection Time: 03/05/15  1:14 PM  Result Value Ref Range   Troponin I <0.03 <0.031 ng/mL    Comment:        NO INDICATION OF MYOCARDIAL  INJURY.   Glucose, capillary     Status: Abnormal   Collection Time: 03/05/15  2:19 PM  Result Value Ref Range   Glucose-Capillary 460 (H) 70 - 99 mg/dL  Glucose, capillary     Status: Abnormal   Collection Time: 03/05/15  4:33 PM  Result Value Ref Range   Glucose-Capillary 408 (H) 70 - 99 mg/dL  Glucose, random     Status: Abnormal   Collection Time: 03/05/15  5:35 PM  Result Value Ref Range   Glucose, Bld 380 (H) 70 - 99 mg/dL  Troponin I     Status: None   Collection Time: 03/05/15  7:05 PM  Result Value Ref Range   Troponin I <0.03 <0.031 ng/mL    Comment:        NO INDICATION OF MYOCARDIAL INJURY.   Glucose, capillary     Status: Abnormal   Collection Time: 03/05/15  9:36 PM  Result Value Ref Range   Glucose-Capillary 156 (H) 70 - 99 mg/dL  Glucose, capillary     Status: None   Collection Time: 03/06/15  1:21 AM  Result Value Ref Range   Glucose-Capillary 89 70 - 99 mg/dL   Comment 1 Notify RN    Comment 2 Document in Chart   Glucose, capillary     Status: Abnormal   Collection Time: 03/06/15  5:09 AM  Result Value Ref Range   Glucose-Capillary 59 (L) 70 - 99 mg/dL  Glucose, capillary     Status: Abnormal   Collection Time: 03/06/15  6:16 AM  Result Value Ref Range   Glucose-Capillary 139 (H) 70 - 99 mg/dL   Comment 1 Notify RN    Comment 2 Document in Chart   Renal function panel     Status: Abnormal   Collection Time: 03/06/15  6:23 AM  Result Value Ref Range   Sodium 141 135 - 145 mmol/L   Potassium 2.9 (L) 3.5 - 5.1 mmol/L   Chloride 105 96 - 112 mmol/L   CO2 26 19 - 32 mmol/L   Glucose, Bld 136 (H) 70 - 99 mg/dL   BUN 45 (H) 6 - 23 mg/dL   Creatinine, Ser 3.37 (H) 0.50 - 1.10 mg/dL   Calcium 8.5 8.4 - 10.5 mg/dL   Phosphorus 3.7 2.3 - 4.6 mg/dL   Albumin 2.9 (L) 3.5 - 5.2 g/dL   GFR calc non Af Amer 14 (L) >90 mL/min   GFR calc Af Amer 16 (L) >90 mL/min    Comment: (NOTE) The eGFR has been calculated using the CKD EPI equation. This calculation has  not been validated in all clinical situations. eGFR's persistently <90 mL/min signify possible Chronic Kidney Disease.  Anion gap 10 5 - 15  CBC     Status: Abnormal   Collection Time: 03/06/15  6:23 AM  Result Value Ref Range   WBC 17.9 (H) 4.0 - 10.5 K/uL   RBC 3.82 (L) 3.87 - 5.11 MIL/uL   Hemoglobin 10.2 (L) 12.0 - 15.0 g/dL   HCT 31.3 (L) 36.0 - 46.0 %   MCV 81.9 78.0 - 100.0 fL   MCH 26.7 26.0 - 34.0 pg   MCHC 32.6 30.0 - 36.0 g/dL   RDW 16.1 (H) 11.5 - 15.5 %   Platelets 291 150 - 400 K/uL  Glucose, capillary     Status: None   Collection Time: 03/06/15  7:41 AM  Result Value Ref Range   Glucose-Capillary 76 70 - 99 mg/dL   Comment 1 Notify RN    Comment 2 Document in Chart   Glucose, capillary     Status: Abnormal   Collection Time: 03/06/15 11:37 AM  Result Value Ref Range   Glucose-Capillary 185 (H) 70 - 99 mg/dL   Comment 1 Notify RN    Comment 2 Document in Chart   Glucose, capillary     Status: Abnormal   Collection Time: 03/06/15  4:12 PM  Result Value Ref Range   Glucose-Capillary 156 (H) 70 - 99 mg/dL  Glucose, capillary     Status: Abnormal   Collection Time: 03/06/15  7:59 PM  Result Value Ref Range   Glucose-Capillary 245 (H) 70 - 99 mg/dL  Glucose, capillary     Status: Abnormal   Collection Time: 03/06/15  9:24 PM  Result Value Ref Range   Glucose-Capillary 268 (H) 70 - 99 mg/dL  Glucose, capillary     Status: Abnormal   Collection Time: 03/07/15 12:46 AM  Result Value Ref Range   Glucose-Capillary 220 (H) 70 - 99 mg/dL   Comment 1 Notify RN    Comment 2 Document in Chart   Glucose, capillary     Status: Abnormal   Collection Time: 03/07/15  4:58 AM  Result Value Ref Range   Glucose-Capillary 188 (H) 70 - 99 mg/dL   Comment 1 Notify RN    Comment 2 Document in Chart   Renal function panel     Status: Abnormal   Collection Time: 03/07/15  6:21 AM  Result Value Ref Range   Sodium 140 135 - 145 mmol/L   Potassium 4.1 3.5 - 5.1 mmol/L     Comment: DELTA CHECK NOTED   Chloride 107 96 - 112 mmol/L   CO2 25 19 - 32 mmol/L   Glucose, Bld 184 (H) 70 - 99 mg/dL   BUN 51 (H) 6 - 23 mg/dL   Creatinine, Ser 3.56 (H) 0.50 - 1.10 mg/dL   Calcium 8.6 8.4 - 10.5 mg/dL   Phosphorus 3.5 2.3 - 4.6 mg/dL   Albumin 3.0 (L) 3.5 - 5.2 g/dL   GFR calc non Af Amer 13 (L) >90 mL/min   GFR calc Af Amer 15 (L) >90 mL/min    Comment: (NOTE) The eGFR has been calculated using the CKD EPI equation. This calculation has not been validated in all clinical situations. eGFR's persistently <90 mL/min signify possible Chronic Kidney Disease.    Anion gap 8 5 - 15  CBC     Status: Abnormal   Collection Time: 03/07/15  6:22 AM  Result Value Ref Range   WBC 16.1 (H) 4.0 - 10.5 K/uL   RBC 4.02 3.87 - 5.11 MIL/uL   Hemoglobin  10.8 (L) 12.0 - 15.0 g/dL   HCT 33.0 (L) 36.0 - 46.0 %   MCV 82.1 78.0 - 100.0 fL   MCH 26.9 26.0 - 34.0 pg   MCHC 32.7 30.0 - 36.0 g/dL   RDW 16.0 (H) 11.5 - 15.5 %   Platelets 311 150 - 400 K/uL  Glucose, capillary     Status: Abnormal   Collection Time: 03/07/15  8:09 AM  Result Value Ref Range   Glucose-Capillary 130 (H) 70 - 99 mg/dL    Dg Chest 2 View  03/06/2015   CLINICAL DATA:  Followup CHF.  EXAM: CHEST  2 VIEW  COMPARISON:  03/05/2015  FINDINGS: Since prior exam, lung aeration has improved. Minimal interstitial thickening persists mostly in the lower lungs. There is no lung consolidation. There are minimal posterior lung base pleural effusions.  Cardiac silhouette is mildly enlarged. No mediastinal or hilar masses or evidence of adenopathy.  IMPRESSION: Significantly improved congestive heart failure. No new abnormalities. No evidence of pneumonia.   Electronically Signed   By: Lajean Manes M.D.   On: 03/06/2015 09:57    Review of Systems  Constitutional: Negative for fever and chills.  Respiratory: Positive for shortness of breath and wheezing.   Cardiovascular: Positive for orthopnea.  Gastrointestinal:  Negative for nausea and vomiting.   Blood pressure 148/68, pulse 77, temperature 98.2 F (36.8 C), temperature source Oral, resp. rate 16, height '5\' 2"'  (1.575 m), weight 99.338 kg (219 lb), SpO2 99 %. Physical Exam  Constitutional: She is oriented to person, place, and time. No distress.  HENT:  Mouth/Throat: No oropharyngeal exudate.  Eyes: No scleral icterus.  Neck: No JVD present.  Cardiovascular: Normal rate and regular rhythm.   No murmur heard. Respiratory: No respiratory distress. She has wheezes. She has no rales.  GI: She exhibits no distension. There is no tenderness.  Genitourinary:  Trace edema  Musculoskeletal: She exhibits edema.  Neurological: She is alert and oriented to person, place, and time.    Assessment/Plan: Problem #1 renal failure: Chronic and stage IV. Presently patient doesn't have any uremic signs and symptoms. Her BUN and creatinine seems to be increasing possibly from fluid removal. Problem #2 difficulty breathing: Possibly a combination of fluid overload/exacerbation of COPD. Patient also with history of neck surgery when she was young not sure how much that contributes to her program. Patient had 3400 mL of urine output with some improvement. Problem #3 hypertension: Her blood pressure is reasonably controlled Problem #4 diabetes Problem #5 anemia: Her hemoglobin and hematocrit is within our target goal. Problem #6 hypokalemia: Her potassium is corrected Problem #7 history of coronary artery disease: She is status post stent placement. Plan: We'll DC Lasix We'll start her on Demadex 40 mg by mouth once a day I have discussed with her that we need to put an access for future dialysis. Presently patient doesn't require dialysis. We'll check her basic metabolic panel in the morning.  [name] S 03/07/2015, 9:42 AM

## 2015-03-07 NOTE — Progress Notes (Signed)
Patient was in the middle of her evening treatment when she began to gag and became nauesa. She removed her treatment and was trying to cough up a lump in her her throat. She said her throat was hurting and she had the same problem to occur when she was at home. She also complained of getting hot and short of breath ( same thing happened at home that lead to her visit). She has a history of throat issues that have not been addressed but was mentioned to the physicians about her pain and problems with swallowing. Patient refused her MDI due the pain her throat. Patient was given  a cup of coffee to soothe her throat;saturations remained 100% on room air. I will continue to monitor her status.

## 2015-03-07 NOTE — Progress Notes (Signed)
ANTIBIOTIC CONSULT NOTE  Pharmacy Consult for Levaquin Indication: COPD exacerbation  Allergies  Allergen Reactions  . Ace Inhibitors Other (See Comments)    cough  . Clopidogrel Bisulfate     REACTION: Sick, Headache, "Felt terrible"  . Codeine Other (See Comments)    REACTION: hallucinations  . Lisinopril Other (See Comments)    cough  . Penicillins     unknown    Patient Measurements: Height: 5\' 2"  (157.5 cm) Weight: 219 lb (99.338 kg) IBW/kg (Calculated) : 50.1  Vital Signs: Temp: 98.2 F (36.8 C) (04/03 0503) Temp Source: Oral (04/03 0503) BP: 148/68 mmHg (04/03 0503) Pulse Rate: 77 (04/03 0503) Intake/Output from previous day: 04/02 0701 - 04/03 0700 In: 1246.7 [P.O.:800; I.V.:396.7; IV Piggyback:50] Out: 3400 [Urine:3400] Intake/Output from this shift: Total I/O In: 3 [I.V.:3] Out: -   Labs:  Recent Labs  03/05/15 0848 03/06/15 0623 03/07/15 0621 03/07/15 0622  WBC 12.7* 17.9*  --  16.1*  HGB 10.8* 10.2*  --  10.8*  PLT 281 291  --  311  CREATININE 3.26* 3.37* 3.56*  --    Estimated Creatinine Clearance: 18.1 mL/min (by C-G formula based on Cr of 3.56). No results for input(s): VANCOTROUGH, VANCOPEAK, VANCORANDOM, GENTTROUGH, GENTPEAK, GENTRANDOM, TOBRATROUGH, TOBRAPEAK, TOBRARND, AMIKACINPEAK, AMIKACINTROU, AMIKACIN in the last 72 hours.   Microbiology: No results found for this or any previous visit (from the past 720 hour(s)).  Anti-infectives    Start     Dose/Rate Route Frequency Ordered Stop   03/06/15 0600  levofloxacin (LEVAQUIN) IVPB 500 mg  Status:  Discontinued     500 mg 100 mL/hr over 60 Minutes Intravenous Every 24 hours 03/05/15 0704 03/05/15 1410   03/06/15 0600  Levofloxacin (LEVAQUIN) IVPB 250 mg     250 mg 50 mL/hr over 60 Minutes Intravenous Every 24 hours 03/05/15 1410 04/05/15 0559   03/05/15 0530  levofloxacin (LEVAQUIN) IVPB 500 mg     500 mg 100 mL/hr over 60 Minutes Intravenous  Once 03/05/15 0519 03/05/15 0631       Assessment: 64 yo F admitted with COPD exacerbation.  CXR + possible PNA.  She is afebrile.  WBC trending down (still elevated, but pt also on steroids).   Acute renal injury noted.  Scr increasing.  NCrCl ~ 15-81ml/min.  Levaquin 4/1>>  Goal of Therapy:  Eradicate infection.  Plan:  Change Levaquin 500mg  PO q48h Monitor renal function and patient progress Duration of therapy per MD  Biagio Borg 03/07/2015,9:55 AM

## 2015-03-07 NOTE — Progress Notes (Signed)
[facility] PROGRESS NOTE  [name] [id number] DOB: [birth year] DOA: 03/05/2015 PCP: Wardell Honour, MD Nephrologist: Dr. Theador Hawthorne    Code Status: Full code Family Communication: No family available Disposition Plan: Discharge to home when clinically improved.   Consultants:  Nephrology   Procedures:  2-D echocardiogram 03/05/15: Study Conclusions - Left ventricle: The cavity size was normal. There was mild concentric hypertrophy. Systolic function was normal. The estimated ejection fraction was in the range of 60% to 65%. Wall motion was normal; there were no regional wall motion abnormalities. Features are consistent with a pseudonormal left ventricular filling pattern, with concomitant abnormal relaxation and increased filling pressure (grade 2 diastolic dysfunction). Doppler parameters are consistent with high ventricular filling pressure. - Mitral valve: Mildly thickened leaflets . There was mild regurgitation. - Left atrium: The atrium was moderately dilated. Volume/bsa, ES, (1-plane Simpson&'s, A2C): 37.8 ml/m^2. - Right atrium: The atrium was mildly dilated. - Pericardium, extracardiac: A very small pericardial effusion was seen. No evidence of tamponade physiology.  Antibiotics:  Levaquin 4/1>>  HPI/Subjective: Patient family says that she is breathing better. Her legs are less swollen. She has less chest congestion. She still has this nondescript upper chest tightness which she admits that she has had for several years and may involve a remote issue with her esophagus. She denies pain with swallowing. She denies difficulty swallowing.  Objective: Filed Vitals:   03/07/15 0503  BP: 148/68  Pulse: 77  Temp: 98.2 F (36.8 C)  Resp: 16   oxygen saturation 99% on supplemental oxygen.  Intake/Output Summary (Last 24 hours) at 03/07/15 1359 Last data filed at 03/07/15 0913  Gross per 24 hour  Intake    373 ml  Output    2350 ml  Net  -1977 ml   Filed Weights   03/05/15 0650 03/06/15 0634 03/07/15 0500  Weight: 100.154 kg (220 lb 12.8 oz) 98.93 kg (218 lb 1.6 oz) 99.338 kg (219 lb)    Exam:   General:  Obese 64 year old African-American woman, in no acute distress.  Cardiovascular: S1, S2, with a soft systolic murmur.  Respiratory: Mostly clear with decreased breath sounds in the bases; breathing nonlabored at rest.  Abdomen: Obese, positive bowel sounds, soft, nontender, nondistended.  Musculoskeletal/extremities: Trace of pedal edema bilaterally. No acute hot red joints.   Neurologic: She is alert and oriented 3. Cranial nerves are intact.  Data Reviewed: Basic Metabolic Panel:  Recent Labs Lab 03/05/15 0221 03/05/15 0848 03/05/15 1735 03/06/15 0623 03/07/15 0621  NA 141  --   --  141 140  K 3.1*  --   --  2.9* 4.1  CL 105  --   --  105 107  CO2 27  --   --  26 25  GLUCOSE 162*  --  380* 136* 184*  BUN 41*  --   --  45* 51*  CREATININE 3.11* 3.26*  --  3.37* 3.56*  CALCIUM 8.9  --   --  8.5 8.6  PHOS  --   --   --  3.7 3.5   Liver Function Tests:  Recent Labs Lab 03/05/15 0221 03/06/15 0623 03/07/15 0621  AST 38*  --   --   ALT 34  --   --   ALKPHOS 62  --   --   BILITOT 0.5  --   --   PROT 6.6  --   --   ALBUMIN 3.3* 2.9* 3.0*   No results for input(s): LIPASE, AMYLASE  in the last 168 hours. No results for input(s): AMMONIA in the last 168 hours. CBC:  Recent Labs Lab 03/05/15 0221 03/05/15 0848 03/06/15 0623 03/07/15 0622  WBC 13.7* 12.7* 17.9* 16.1*  NEUTROABS 9.5*  --   --   --   HGB 12.0 10.8* 10.2* 10.8*  HCT 36.2 32.2* 31.3* 33.0*  MCV 81.2 81.1 81.9 82.1  PLT 295 281 291 311   Cardiac Enzymes:  Recent Labs Lab 03/05/15 0221 03/05/15 0848 03/05/15 1314 03/05/15 1905  TROPONINI <0.03 <0.03 <0.03 <0.03   BNP (last 3 results)  Recent Labs  03/05/15 0214  BNP 292.0*    ProBNP (last 3 results) No results for input(s): PROBNP in the last  8760 hours.  CBG:  Recent Labs Lab 03/06/15 2124 03/07/15 0046 03/07/15 0458 03/07/15 0809 03/07/15 1144  GLUCAP 268* 220* 188* 130* 226*    No results found for this or any previous visit (from the past 240 hour(s)).   Studies: Dg Chest 2 View  03/06/2015   CLINICAL DATA:  Followup CHF.  EXAM: CHEST  2 VIEW  COMPARISON:  03/05/2015  FINDINGS: Since prior exam, lung aeration has improved. Minimal interstitial thickening persists mostly in the lower lungs. There is no lung consolidation. There are minimal posterior lung base pleural effusions.  Cardiac silhouette is mildly enlarged. No mediastinal or hilar masses or evidence of adenopathy.  IMPRESSION: Significantly improved congestive heart failure. No new abnormalities. No evidence of pneumonia.   Electronically Signed   By: Lajean Manes M.D.   On: 03/06/2015 09:57    Scheduled Meds: . albuterol  2.5 mg Nebulization Q6H  . antiseptic oral rinse  7 mL Mouth Rinse BID  . aspirin EC  81 mg Oral Daily  . atenolol  50 mg Oral Daily  . cholecalciferol  1,000 Units Oral q morning - 10a  . gabapentin  100 mg Oral TID  . heparin  5,000 Units Subcutaneous 3 times per day  . insulin aspart  0-15 Units Subcutaneous 6 times per day  . insulin glargine  60 Units Subcutaneous Q2200  . [START ON 03/08/2015] levofloxacin  500 mg Oral Q48H  . NIFEdipine  90 mg Oral QPM  . polyethylene glycol  17 g Oral Daily  . predniSONE  40 mg Oral BID WC  . rosuvastatin  20 mg Oral Daily  . senna  1 tablet Oral QHS  . sodium chloride  3 mL Intravenous Q12H  . tiotropium  18 mcg Inhalation Daily  . torsemide  40 mg Oral Daily   Continuous Infusions:   Assessment and plan:  Principal Problem:   Acute on chronic diastolic CHF (congestive heart failure) Active Problems:   Bronchitis, chronic obstructive w acute bronchitis   Pericardial effusion   Type II diabetes mellitus with nephropathy   Hyperlipidemia   HYPERTENSION, BENIGN   Coronary  atherosclerosis   Hypokalemia   Leukocytosis   Chronic kidney disease, stage IV (severe)   Morbid obesity    1. Acute on chronic diastolic congestive heart failure. [name] chest x-ray on admission revealed vascular congestion and probable pulmonary edema. Her proBNP was 292, but this is still elevated in the setting of obesity. She was started on IV Lasix every 12 hours. The dose was decreased due to the patient's hyperglycemia and a concern for dehydration. She was maintained on her beta blocker and calcium channel blocker. Her in's/outs are -900 cc. Her weight is down approximately 1 pound. Of note, she was given gentle  IV fluids when her CBG increased to greater than 450. Her 2-D echo revealed an EF of 60-65% and grade 2 diastolic dysfunction. Her TSH was within normal limits. The patient has less peripheral edema and her follow-up chest x-ray on 03/06/15 reveal less pulmonary edema. Her ins and outs are -2.9 L. The accuracy of her weights is questionable. Lasix was decreased from 40 mg IV every 12 hours to 20 mg IV every 12 hours in light of her worsening renal function. Nephrology consulted and has stopped the IV Lasix and started her on Demadex.  Possible concomitant chronic obstructive bronchitis; acute on chronic. The patient was given IV Solu-Medrol in the ED, but it was discontinued as it was thought that her dyspnea was more from CHF than COPD. Her follow-up chest x-ray revealed some peripheral interstitial thickening which may reflect bronchitis. IV Levaquin was started. She was started on albuterol nebulizer 3 times daily and Symbicort. Will increase the frequency of the nebulizer to every 6 hours. We'll Spiriva. Steroid therapy restarted with prednisone and will try to taper quickly. We'll continue oxygen therapy as needed. We'll try to refer her to Dr. Luan Pulling for outpatient evaluation. She would benefit from PFTs in the nonacute setting.  Hypertension. She was restarted on  Procardia and atenolol. Her blood pressures are reasonable.  Chest tightness. Per my conversation with the patient and nephrology who has known the patient for years, this chest tightness is not new. Apparently, the patient had C-spine surgery and her esophagus had to be manipulated. Also, she may have had some type of surgery on her "neck" as a child secondary to a "abnormal bone". She has of esophageal stricture, but per her account, her gastroenterologist Dr. Amedeo Plenty attempted to dilate it, but was met with resistance so aborted the procedure. The patient denies odontophagia or dysphagia and has no difficulty eating. This could be secondary to CHF and acute bronchitis. The echocardiogram did note a very small pericardial effusion which could be possibly playing a role. Left ventricular wall motion was normal. Her troponin I was negative 3. We'll continue to treat her supportively with analgesics as ordered. NSAID was considered, but is being held in light of her compromised renal function. We'll order a noncontrasted CT for further evaluation.  Stage III to stage IV chronic kidney disease. The patient is followed by Dr. Theador Hawthorne. Several days ago, he stopped her Avapro and increased her Lasix as her creatinine had apparently increased in the outpatient setting. She was told that she has stage III or stage IV chronic kidney disease. Her creatinine on admission was 3.11. It has increased to 3.56.[name] Her urine output appears to be nonoliguric. Nephrology was consulted and reported that hemodialysis will likely be needed in this patient with progressive chronic kidney disease. Apparently this was discussed with the patient in the outpatient setting. Dr. Lowanda Foster has discontinued IV Lasix and started her on Demadex.  Hypokalemia. [name] potassium was 3.1 on admission likely secondary to increased outpatient Lasix dosing. It fell to 2.9. She was started on potassium chloride supplementation. Her serum  potassium has improved.  Will check a magnesium level tomorrow morning.  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 The patient is treated with Lantus and Humalog chronically. Her blood glucose reached to greater than 450 following IV SoluMedrol given in the ED. She was treated accordingly with titrating the insulin-now given every 4 hours rather than every before meals and daily at bedtime. She was also briefly given gentle IV  fluids. Her CBGs are better. Hemoglobin A1c is pending. -We'll decrease gabapentin to 100 mg 3 times a day per recommendation by pharmacy.  Normocytic anemia. Her anemia is likely secondary to chronic disease/CKD. Ferritin, total iron, and vitamin B12 levels were ordered and are pending.  Leukocytosis: This is likely from a combination of acute bronchitis and steroid therapy. She is afebrile. We'll continue to monitor.   Time spent: 35 minutes    [facility] Pager [phone]. If 7PM-7AM, please contact night-coverage at [email or web address], password [facility] 03/07/2015, 1:59 PM  LOS: 2 days

## 2015-03-08 ENCOUNTER — Ambulatory Visit: Payer: Self-pay | Admitting: Cardiology

## 2015-03-08 ENCOUNTER — Encounter (HOSPITAL_COMMUNITY): Payer: Self-pay | Admitting: Internal Medicine

## 2015-03-08 ENCOUNTER — Inpatient Hospital Stay (HOSPITAL_COMMUNITY): Payer: Medicare Other

## 2015-03-08 DIAGNOSIS — J189 Pneumonia, unspecified organism: Secondary | ICD-10-CM | POA: Diagnosis present

## 2015-03-08 DIAGNOSIS — D72829 Elevated white blood cell count, unspecified: Secondary | ICD-10-CM

## 2015-03-08 DIAGNOSIS — R222 Localized swelling, mass and lump, trunk: Secondary | ICD-10-CM | POA: Diagnosis present

## 2015-03-08 LAB — RENAL FUNCTION PANEL
Albumin: 3 g/dL — ABNORMAL LOW (ref 3.5–5.2)
Anion gap: 8 (ref 5–15)
BUN: 55 mg/dL — ABNORMAL HIGH (ref 6–23)
CO2: 26 mmol/L (ref 19–32)
Calcium: 8.7 mg/dL (ref 8.4–10.5)
Chloride: 106 mmol/L (ref 96–112)
Creatinine, Ser: 3.31 mg/dL — ABNORMAL HIGH (ref 0.50–1.10)
GFR calc Af Amer: 16 mL/min — ABNORMAL LOW
GFR calc non Af Amer: 14 mL/min — ABNORMAL LOW
Glucose, Bld: 142 mg/dL — ABNORMAL HIGH (ref 70–99)
Phosphorus: 4.3 mg/dL (ref 2.3–4.6)
Potassium: 3.6 mmol/L (ref 3.5–5.1)
Sodium: 140 mmol/L (ref 135–145)

## 2015-03-08 LAB — HEMOGLOBIN A1C
Hgb A1c MFr Bld: 7.6 % — ABNORMAL HIGH (ref 4.8–5.6)
Mean Plasma Glucose: 171 mg/dL

## 2015-03-08 LAB — CBC
HCT: 33.1 % — ABNORMAL LOW (ref 36.0–46.0)
Hemoglobin: 11 g/dL — ABNORMAL LOW (ref 12.0–15.0)
MCH: 27.1 pg (ref 26.0–34.0)
MCHC: 33.2 g/dL (ref 30.0–36.0)
MCV: 81.5 fL (ref 78.0–100.0)
Platelets: 326 K/uL (ref 150–400)
RBC: 4.06 MIL/uL (ref 3.87–5.11)
RDW: 16 % — ABNORMAL HIGH (ref 11.5–15.5)
WBC: 23.4 K/uL — ABNORMAL HIGH (ref 4.0–10.5)

## 2015-03-08 LAB — GLUCOSE, CAPILLARY
Glucose-Capillary: 119 mg/dL — ABNORMAL HIGH (ref 70–99)
Glucose-Capillary: 169 mg/dL — ABNORMAL HIGH (ref 70–99)
Glucose-Capillary: 219 mg/dL — ABNORMAL HIGH (ref 70–99)
Glucose-Capillary: 234 mg/dL — ABNORMAL HIGH (ref 70–99)
Glucose-Capillary: 241 mg/dL — ABNORMAL HIGH (ref 70–99)
Glucose-Capillary: 329 mg/dL — ABNORMAL HIGH (ref 70–99)

## 2015-03-08 LAB — MAGNESIUM: Magnesium: 2.3 mg/dL (ref 1.5–2.5)

## 2015-03-08 MED ORDER — GI COCKTAIL ~~LOC~~
30.0000 mL | Freq: Once | ORAL | Status: DC
  Filled 2015-03-08: qty 30

## 2015-03-08 MED ORDER — PREDNISONE 20 MG PO TABS
20.0000 mg | ORAL_TABLET | Freq: Two times a day (BID) | ORAL | Status: DC
  Administered 2015-03-08 – 2015-03-09 (×2): 20 mg via ORAL
  Filled 2015-03-08 (×2): qty 1

## 2015-03-08 MED ORDER — HYDRALAZINE HCL 25 MG PO TABS
50.0000 mg | ORAL_TABLET | Freq: Two times a day (BID) | ORAL | Status: DC
  Administered 2015-03-08 – 2015-03-09 (×3): 50 mg via ORAL
  Filled 2015-03-08 (×3): qty 2

## 2015-03-08 MED ORDER — PREDNISONE 10 MG PO TABS
30.0000 mg | ORAL_TABLET | Freq: Two times a day (BID) | ORAL | Status: DC
  Administered 2015-03-08: 30 mg via ORAL

## 2015-03-08 MED ORDER — ALBUTEROL SULFATE (2.5 MG/3ML) 0.083% IN NEBU: 2.5000 mg | INHALATION_SOLUTION | Freq: Four times a day (QID) | RESPIRATORY_TRACT | Status: DC | PRN

## 2015-03-08 MED ORDER — ALUM & MAG HYDROXIDE-SIMETH 200-200-20 MG/5ML PO SUSP
30.0000 mL | Freq: Four times a day (QID) | ORAL | Status: DC | PRN
  Administered 2015-03-08 – 2015-03-09 (×2): 30 mL via ORAL
  Filled 2015-03-08: qty 30

## 2015-03-08 NOTE — Progress Notes (Signed)
Inpatient Diabetes Program Recommendations  AACE/ADA: New Consensus Statement on Inpatient Glycemic Control (2013)  Target Ranges:  Prepandial:   less than 140 mg/dL      Peak postprandial:   less than 180 mg/dL (1-2 hours)      Critically ill patients:  140 - 180 mg/dL   Results for JAIE, ANDA (MRN [id number]) as of 03/08/2015 08:27  Ref. Range 03/07/2015 08:09 03/07/2015 11:44 03/07/2015 17:15 03/07/2015 19:42 03/07/2015 21:10 03/07/2015 23:58 03/08/2015 04:05 03/08/2015 07:53  Glucose-Capillary Latest Range: 70-99 mg/dL 130 (H) 226 (H) 256 (H) 342 (H) 307 (H) 219 (H) 169 (H) 119 (H)   Diabetes history: DM2 Outpatient Diabetes medications: Lantus 68 units QHS, Humalog 10-13 units TID with meals Current orders for Inpatient glycemic control: Lantus 60 units QHS, Novolog 0-15 units Q4H  Inpatient Diabetes Program Recommendations Correction (SSI): According to the chart, patient is eating 75-100% of meals. May want to consider changing frequency of CBGs and Novolog correction to ACHS. Insulin - Meal Coverage: Note steroids are being tapered; currently ordered Prednisone 30 mg BID. While inpatient and ordered steroids, please consider ordering Novolog 5 units TID with meals for meal coverage.  Thanks, Barnie Alderman, RN, MSN, CCRN, CDE Diabetes Coordinator Inpatient Diabetes Program [phone] (Team Pager from [place] to [place]) [phone] ([facility] office) [phone] [facility] office)

## 2015-03-08 NOTE — Progress Notes (Signed)
Subjective: Interval History: has complaints of choking and pain over her throat last night. She still has some pain in her throat area. Her breathing is much better. She doesn't have any nausea or vomiting..  Objective: Vital signs in last 24 hours: Temp:  [98.1 F (36.7 C)-98.9 F (37.2 C)] 98.3 F (36.8 C) (04/04 0600) Pulse Rate:  [76-82] 79 (04/04 0600) Resp:  [18-20] 18 (04/04 0600) BP: (156-186)/(66-87) 170/87 mmHg (04/04 0600) SpO2:  [97 %-99 %] 98 % (04/04 0800) Weight:  [98.8 kg (217 lb 13 oz)] 98.8 kg (217 lb 13 oz) (04/04 0600) Weight change: -0.538 kg (-1 lb 3 oz)  Intake/Output from previous day: 04/03 0701 - 04/04 0700 In: 683 [P.O.:680; I.V.:3] Out: 4200 [Urine:4200] Intake/Output this shift: Total I/O In: 240 [P.O.:240] Out: 950 [Urine:950]  General appearance: alert, cooperative and no distress Resp: diminished breath sounds posterior - bilateral and dullness to percussion posterior - bilateral Cardio: regular rate and rhythm, S1, S2 normal, no murmur, click, rub or gallop GI: soft, non-tender; bowel sounds normal; no masses,  no organomegaly Extremities: edema Trace edema  Lab Results:  Recent Labs  03/07/15 0622 03/08/15 0643  WBC 16.1* 23.4*  HGB 10.8* 11.0*  HCT 33.0* 33.1*  PLT 311 326   BMET:  Recent Labs  03/07/15 0621 03/08/15 0643  NA 140 140  K 4.1 3.6  CL 107 106  CO2 25 26  GLUCOSE 184* 142*  BUN 51* 55*  CREATININE 3.56* 3.31*  CALCIUM 8.6 8.7   No results for input(s): PTH in the last 72 hours. Iron Studies:  Recent Labs  03/07/15 0622  IRON 64  TIBC 265  FERRITIN 155    Studies/Results: Ct Chest Wo Contrast  03/07/2015   CLINICAL DATA:  Worsening of chronic chest tightness. History of CHF, COPD and esophageal stricture. Initial encounter.  EXAM: CT CHEST WITHOUT CONTRAST  TECHNIQUE: Multidetector CT imaging of the chest was performed following the standard protocol without IV contrast.  COMPARISON:  Radiographs  03/05/2015 and 03/06/2015. No prior chest CT available. Abdominal MRI 02/06/2012 and renal ultrasound 02/24/2013 correlated.  FINDINGS: Mediastinum/Nodes: There are no enlarged mediastinal, hilar or axillary lymph nodes. Small mediastinal lymph nodes are noted. Hilar assessment is limited without contrast. The thyroid gland, trachea and esophagus demonstrate no significant findings. The heart size is normal. There is trace pericardial fluid.There is atherosclerosis of the aorta, great vessels and coronary arteries. Left circumflex stent noted.  Lungs/Pleura: There is no significant pleural effusion. Patchy ground-glass opacities are noted within the right lung, most notable within the right upper lobe on image 24 and in the right lower lobe on images 20 for and 31. These are likely inflammatory. There are scattered tiny subpleural nodules bilaterally.  Upper abdomen: There is a tiny nonobstructing calculus in the upper pole of the left kidney. Ill-defined low-density in the upper pole of the left kidney measures near water density and is probably a cyst.  Musculoskeletal/Chest wall: There is no chest wall mass or suspicious osseous finding.  IMPRESSION: 1. Patchy ground-glass opacities in the right lung, likely inflammatory. Atypical neoplasm considered much less likely. Consider follow-up by chest CT without contrast in 3 months to assess for resolution versus persistence. This recommendation follows the consensus statement: Recommendations for the Management of Subsolid Pulmonary Nodules Detected at CT: A Statement from the [place] as published in Radiology 2013; 266:304-317. 2. Scattered tiny subpleural nodules bilaterally, likely benign. These can be addressed on follow-up. 3. Atherosclerosis as described  with coronary artery stent.   Electronically Signed   By: Richardean Sale M.D.   On: 03/07/2015 16:19    I have reviewed the patient's current medications.  Assessment/Plan: Problem #1  difficulty breathing: Seems to be improving. Presently she is nonoliguric. And patient seems to have reasonable urine output. Problem #2 history of choking last night. Patient states that she wake up from her sleep and she is some pain in her throat but not in her chest. She states that she need to sleep in certain way without bending her head. Problem #3 hypertension: Her blood pressure is high. Problem #4 Type 2 diabetes: Her blood sugar is better this morning. Problem #5 anemia: Her hemoglobin is stable and in range. Problem #6 hypokalemia: Patient is on potassium supplement and her potassium is normal. Problem #7 metabolic bone disease: Her calcium is in range. Problem #8 leukocytosis:. Chest x-ray with patchy groundglass opacities within the right lung, no sure whether this is infectious. The worsening of her white blood cell count could be related to her steroid but infection cannot be ruled out. Plan: We'll start patient on hydralazine 50 mg by mouth twice a day We'll continue his other medications as before.     LOS: 3 days   Greenly Rarick S 03/08/2015,10:39 AM

## 2015-03-08 NOTE — Progress Notes (Signed)
[facility] PROGRESS NOTE  [name] ZOX:[id number] DOB: 1951-01-05 DOA: 03/05/2015 PCP: Wardell Honour, MD Nephrologist: Dr. Theador Hawthorne    Code Status: Full code Family Communication: No family available Disposition Plan: Discharge to home when clinically improved.   Consultants:  Nephrology   Procedures:  2-D echocardiogram 03/05/15: Study Conclusions - Left ventricle: The cavity size was normal. There was mild concentric hypertrophy. Systolic function was normal. The estimated ejection fraction was in the range of 60% to 65%. Wall motion was normal; there were no regional wall motion abnormalities. Features are consistent with a pseudonormal left ventricular filling pattern, with concomitant abnormal relaxation and increased filling pressure (grade 2 diastolic dysfunction). Doppler parameters are consistent with high ventricular filling pressure. - Mitral valve: Mildly thickened leaflets . There was mild regurgitation. - Left atrium: The atrium was moderately dilated. Volume/bsa, ES, (1-plane Simpson&'s, A2C): 37.8 ml/m^2. - Right atrium: The atrium was mildly dilated. - Pericardium, extracardiac: A very small pericardial effusion was seen. No evidence of tamponade physiology.  Antibiotics:  Levaquin 4/1>>  HPI/Subjective: Overnight, the patient complained of neck fullness, became nauseated, and gag. This is during her nebulizer treatment. Since that time, she has no complaints of pain with  Swallowing or difficulty swallowing her breakfast or lunch.  Objective: Filed Vitals:   03/08/15 0600  BP: 170/87  Pulse: 79  Temp: 98.3 F (36.8 C)  Resp: 18   oxygen saturation 98% on supplemental oxygen.  Intake/Output Summary (Last 24 hours) at 03/08/15 1243 Last data filed at 03/08/15 1037  Gross per 24 hour  Intake    800 ml  Output   4250 ml  Net  -3450 ml   Filed Weights   03/06/15 0634 03/07/15 0500 03/08/15 0600  Weight:  98.93 kg (218 lb 1.6 oz) 99.338 kg (219 lb) 98.8 kg (217 lb 13 oz)    Exam:   General:  Obese 64 year old African-American woman, in no acute distress.   Oropharynx: no posterior exudates or erythema ; no signs of thrush.  Cardiovascular: S1, S2, with a soft systolic murmur.  Respiratory: Mostly clear with decreased breath sounds in the bases; breathing nonlabored at rest.  Abdomen: Obese, positive bowel sounds, soft, nontender, nondistended.  Musculoskeletal/extremities:  Resolution of edema bilaterally. No acute hot red joints.   Neurologic: She is alert and oriented 3. Cranial nerves are intact.  Data Reviewed: Basic Metabolic Panel:  Recent Labs Lab 03/05/15 0221 03/05/15 0848 03/05/15 1735 03/06/15 8119 03/07/15 0621 03/08/15 0643  NA 141  --   --  141 140 140  K 3.1*  --   --  2.9* 4.1 3.6  CL 105  --   --  105 107 106  CO2 27  --   --  '26 25 26  ' GLUCOSE 162*  --  380* 136* 184* 142*  BUN 41*  --   --  45* 51* 55*  CREATININE 3.11* 3.26*  --  3.37* 3.56* 3.31*  CALCIUM 8.9  --   --  8.5 8.6 8.7  MG  --   --   --   --   --  2.3  PHOS  --   --   --  3.7 3.5 4.3   Liver Function Tests:  Recent Labs Lab 03/05/15 0221 03/06/15 0623 03/07/15 0621 03/08/15 0643  AST 38*  --   --   --   ALT 34  --   --   --   ALKPHOS 62  --   --   --  BILITOT 0.5  --   --   --   PROT 6.6  --   --   --   ALBUMIN 3.3* 2.9* 3.0* 3.0*   No results for input(s): LIPASE, AMYLASE in the last 168 hours. No results for input(s): AMMONIA in the last 168 hours. CBC:  Recent Labs Lab 03/05/15 0221 03/05/15 0848 03/06/15 0623 03/07/15 0622 03/08/15 0643  WBC 13.7* 12.7* 17.9* 16.1* 23.4*  NEUTROABS 9.5*  --   --   --   --   HGB 12.0 10.8* 10.2* 10.8* 11.0*  HCT 36.2 32.2* 31.3* 33.0* 33.1*  MCV 81.2 81.1 81.9 82.1 81.5  PLT 295 281 291 311 326   Cardiac Enzymes:  Recent Labs Lab 03/05/15 0221 03/05/15 0848 03/05/15 1314 03/05/15 1905  TROPONINI <0.03 <0.03 <0.03  <0.03   BNP (last 3 results)  Recent Labs  03/05/15 0214  BNP 292.0*    ProBNP (last 3 results) No results for input(s): PROBNP in the last 8760 hours.  CBG:  Recent Labs Lab 03/07/15 2110 03/07/15 2358 03/08/15 0405 03/08/15 0753 03/08/15 1215  GLUCAP 307* 219* 169* 119* 241*    No results found for this or any previous visit (from the past 240 hour(s)).   Studies: Ct Chest Wo Contrast  03/07/2015   CLINICAL DATA:  Worsening of chronic chest tightness. History of CHF, COPD and esophageal stricture. Initial encounter.  EXAM: CT CHEST WITHOUT CONTRAST  TECHNIQUE: Multidetector CT imaging of the chest was performed following the standard protocol without IV contrast.  COMPARISON:  Radiographs 03/05/2015 and 03/06/2015. No prior chest CT available. Abdominal MRI 02/06/2012 and renal ultrasound 02/24/2013 correlated.  FINDINGS: Mediastinum/Nodes: There are no enlarged mediastinal, hilar or axillary lymph nodes. Small mediastinal lymph nodes are noted. Hilar assessment is limited without contrast. The thyroid gland, trachea and esophagus demonstrate no significant findings. The heart size is normal. There is trace pericardial fluid.There is atherosclerosis of the aorta, great vessels and coronary arteries. Left circumflex stent noted.  Lungs/Pleura: There is no significant pleural effusion. Patchy ground-glass opacities are noted within the right lung, most notable within the right upper lobe on image 24 and in the right lower lobe on images 20 for and 31. These are likely inflammatory. There are scattered tiny subpleural nodules bilaterally.  Upper abdomen: There is a tiny nonobstructing calculus in the upper pole of the left kidney. Ill-defined low-density in the upper pole of the left kidney measures near water density and is probably a cyst.  Musculoskeletal/Chest wall: There is no chest wall mass or suspicious osseous finding.  IMPRESSION: 1. Patchy ground-glass opacities in the right  lung, likely inflammatory. Atypical neoplasm considered much less likely. Consider follow-up by chest CT without contrast in 3 months to assess for resolution versus persistence. This recommendation follows the consensus statement: Recommendations for the Management of Subsolid Pulmonary Nodules Detected at CT: A Statement from the [place] as published in Radiology 2013; 266:304-317. 2. Scattered tiny subpleural nodules bilaterally, likely benign. These can be addressed on follow-up. 3. Atherosclerosis as described with coronary artery stent.   Electronically Signed   By: Richardean Sale M.D.   On: 03/07/2015 16:19    Scheduled Meds: . albuterol  2.5 mg Nebulization Q6H  . antiseptic oral rinse  7 mL Mouth Rinse BID  . aspirin EC  81 mg Oral Daily  . atenolol  50 mg Oral Daily  . cholecalciferol  1,000 Units Oral q morning - 10a  . gabapentin  100 mg  Oral TID  . heparin  5,000 Units Subcutaneous 3 times per day  . hydrALAZINE  50 mg Oral BID  . insulin aspart  0-15 Units Subcutaneous 6 times per day  . insulin glargine  60 Units Subcutaneous Q2200  . levofloxacin  500 mg Oral Q48H  . NIFEdipine  90 mg Oral QPM  . polyethylene glycol  17 g Oral Daily  . predniSONE  20 mg Oral BID WC  . rosuvastatin  20 mg Oral Daily  . senna  1 tablet Oral QHS  . sodium chloride  3 mL Intravenous Q12H  . tiotropium  18 mcg Inhalation Daily  . torsemide  40 mg Oral Daily   Continuous Infusions:   Assessment and plan:  Principal Problem:   Acute on chronic diastolic CHF (congestive heart failure) Active Problems:   Bronchitis, chronic obstructive w acute bronchitis   Pericardial effusion   Type II diabetes mellitus with nephropathy   Hyperlipidemia   HYPERTENSION, BENIGN   Coronary atherosclerosis   Hypokalemia   Leukocytosis   Chronic kidney disease, stage IV (severe)   Morbid obesity    1. Acute on chronic diastolic congestive heart failure. [name] chest x-ray on  admission revealed vascular congestion and probable pulmonary edema. Her proBNP was 292,  elevated in the setting of obesity. She was started on IV Lasix every 12 hours. The dose was decreased due to the patient's hyperglycemia and a concern for dehydration. She was maintained on her beta blocker and calcium channel blocker.  Her 2-D echo revealed an EF of 60-65% and grade 2 diastolic dysfunction. Her TSH was within normal limits. [name] peripheral edema has resolved and her follow-up chest x-ray on 03/06/15 reveal less pulmonary edema. Her ins and outs are -7.1 L. The accuracy of her weights is questionable.  Lasix was decreased from 40 mg IV every 12 hours to 20 mg IV every 12 hours in light of her worsening renal function. Nephrology was consulted and stopped the IV Lasix and started her on Demadex. Her ins and outs are -7.1 L. The accuracy of her weights is questionable.   Possible CAP and chronic obstructive bronchitis with mildacute on chronic exacerbation. The patient was given IV Solu-Medrol in the ED, but it was discontinued as it was thought that her dyspnea was more from CHF than COPD. Her follow-up chest x-ray revealed some peripheral interstitial thickening which may have reflected  bronchitis. IV Levaquin was started. She was started on albuterol nebulizer 3 times daily and Symbicort.  Spiriva was added empirically. Steroid therapy was restarted with prednisone. Noncontrasted chest x-ray was noted for further evaluation. It revealed patchy ground glass opacities in the right lung likely inflammatory and scattered tiny subpleural nodules bilaterally -per radiology likely benign. Would recommend follow-up CT of the chest in 3 months to assess for resolution versus persistence.  Will consider the right lung inflammation as CAP and continue Levaquin.  Her white blood cell count has increased significantly , likely secondary to steroid therapy. She appears to be very sensitive to steroids  and therefore, prednisone will be quickly tapered. We'll try to refer her to Dr. Luan Pulling for outpatient evaluation. She would benefit from PFTs in the nonacute setting.  Chest tightness and subjective gagging /fullness in neck.. Per my conversation with the patient and nephrology who has known the patient for years, this chest tightness and neck/throat complaints not new. Apparently, the patient had remote C-spine surgery and her esophagus had to be manipulated. Also, she  may have had some type of surgery on her "neck" as a child secondary to an "abnormal bone". She has of esophageal stricture, but per her account, her gastroenterologist Dr. Amedeo Plenty attempted to dilate it, but was met with resistance so he aborted the procedure and did not reattempt dilatation. The patient denies odontophagia or dysphagia and has no difficulty eating. The noncontrasted CT of the chest revealed inflammatory changes in the right lung and probable benign subpleural nodules ; not sure if these findings are playing a role. The echocardiogram did note a very small pericardial effusion , but I doubt this finding is playing a part. Left ventricular wall motion was normal. Her troponin I was negative 3. For additional evaluation, will order a noncontrasted CT of her neck. We'll consider obtaining an ENT referral. I recommended that the patient go back to Dr. Amedeo Plenty for further evaluation, but she stated that he told her that he would not attempt an EGD again.  Stage III to stage IV chronic kidney disease. The patient is followed by Dr. Theador Hawthorne. Several days prior to the hospitalization, he stopped her Avapro and increased her Lasix as her creatinine had apparently increased in the outpatient setting. She was told that she has stage III or stage IV chronic kidney disease. Her creatinine on admission was 3.11. It has increased to 3.56.[name] Her urine output appears to be nonoliguric. Nephrology was consulted and reported that  hemodialysis will likely be needed in this patient with progressive chronic kidney disease. Apparently this was discussed with the patient in the outpatient setting. Dr. Lowanda Foster  discontinued IV Lasix and started her on Demadex.  Hypertension. She was restarted on Procardia and atenolol. Avapro was discontinued in the outpatient setting by nephrology. Her blood pressures  Have trended up. Nephrology started twice a day dosing of hydralazine.  Hypokalemia. [name] potassium was 3.1 on admission likely secondary to increased outpatient Lasix dosing. It fell to 2.9. She was started on potassium chloride supplementation. Her serum potassium has improved.  Will check a magnesium level tomorrow morning.  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 The patient is treated with Lantus and Humalog chronically. Her blood glucose reached to greater than 450 following IV SoluMedrol given in the ED. She was treated accordingly with titrating the insulin-now given every 4 hours rather than every before meals and daily at bedtime. She was also briefly given gentle IV fluids. Her CBGs are better , but she appears to be very sensitive to steroids. Hemoglobin A1c is pending. -We'll decrease gabapentin to 100 mg 3 times a day per recommendation by pharmacy.  Normocytic anemia. Her anemia is likely secondary to chronic disease/CKD. Her anemia panel was unremarkable with a ferritin was 155, total iron and TIBC were within normal limits, and vitamin B12 was within normal limits at 394.  Leukocytosis: This is likely from a combination of acute bronchitis and steroid therapy. She is afebrile. We'll continue to monitor.   Time spent: 30 minutes    [facility] Pager [phone]. If 7PM-7AM, please contact night-coverage at [email or web address], password [facility] 03/08/2015, 12:43 PM  LOS: 3 days

## 2015-03-09 ENCOUNTER — Encounter (HOSPITAL_COMMUNITY): Payer: Self-pay | Admitting: Internal Medicine

## 2015-03-09 DIAGNOSIS — J189 Pneumonia, unspecified organism: Secondary | ICD-10-CM

## 2015-03-09 LAB — CBC WITH DIFFERENTIAL/PLATELET
Basophils Absolute: 0 10*3/uL (ref 0.0–0.1)
Basophils Relative: 0 % (ref 0–1)
Eosinophils Absolute: 0 10*3/uL (ref 0.0–0.7)
Eosinophils Relative: 0 % (ref 0–5)
HCT: 36.6 % (ref 36.0–46.0)
Hemoglobin: 11.9 g/dL — ABNORMAL LOW (ref 12.0–15.0)
Lymphocytes Relative: 12 % (ref 12–46)
Lymphs Abs: 2.6 10*3/uL (ref 0.7–4.0)
MCH: 26.6 pg (ref 26.0–34.0)
MCHC: 32.5 g/dL (ref 30.0–36.0)
MCV: 81.9 fL (ref 78.0–100.0)
Monocytes Absolute: 0.9 10*3/uL (ref 0.1–1.0)
Monocytes Relative: 4 % (ref 3–12)
Neutro Abs: 19 10*3/uL — ABNORMAL HIGH (ref 1.7–7.7)
Neutrophils Relative %: 84 % — ABNORMAL HIGH (ref 43–77)
Platelets: 376 10*3/uL (ref 150–400)
RBC: 4.47 MIL/uL (ref 3.87–5.11)
RDW: 15.9 % — ABNORMAL HIGH (ref 11.5–15.5)
WBC: 22.5 10*3/uL — ABNORMAL HIGH (ref 4.0–10.5)

## 2015-03-09 LAB — HEMOGLOBIN A1C
Hgb A1c MFr Bld: 7.8 % — ABNORMAL HIGH (ref 4.8–5.6)
Mean Plasma Glucose: 177 mg/dL

## 2015-03-09 LAB — BASIC METABOLIC PANEL
Anion gap: 9 (ref 5–15)
BUN: 60 mg/dL — ABNORMAL HIGH (ref 6–23)
CO2: 28 mmol/L (ref 19–32)
Calcium: 8.8 mg/dL (ref 8.4–10.5)
Chloride: 104 mmol/L (ref 96–112)
Creatinine, Ser: 3.36 mg/dL — ABNORMAL HIGH (ref 0.50–1.10)
GFR calc Af Amer: 16 mL/min — ABNORMAL LOW (ref >90)
GFR calc non Af Amer: 14 mL/min — ABNORMAL LOW (ref >90)
Glucose, Bld: 162 mg/dL — ABNORMAL HIGH (ref 70–99)
Potassium: 4 mmol/L (ref 3.5–5.1)
Sodium: 141 mmol/L (ref 135–145)

## 2015-03-09 LAB — GLUCOSE, CAPILLARY
Glucose-Capillary: 134 mg/dL — ABNORMAL HIGH (ref 70–99)
Glucose-Capillary: 269 mg/dL — ABNORMAL HIGH (ref 70–99)

## 2015-03-09 MED ORDER — BUDESONIDE-FORMOTEROL FUMARATE 160-4.5 MCG/ACT IN AERO: 2.0000 | INHALATION_SPRAY | Freq: Two times a day (BID) | RESPIRATORY_TRACT | Status: DC | PRN

## 2015-03-09 MED ORDER — PANTOPRAZOLE SODIUM 40 MG PO TBEC: 40.0000 mg | DELAYED_RELEASE_TABLET | Freq: Every day | ORAL | Status: DC

## 2015-03-09 MED ORDER — HYDRALAZINE HCL 50 MG PO TABS: 50.0000 mg | ORAL_TABLET | Freq: Two times a day (BID) | ORAL | Status: DC

## 2015-03-09 MED ORDER — ALBUTEROL SULFATE HFA 108 (90 BASE) MCG/ACT IN AERS: 2.0000 | INHALATION_SPRAY | Freq: Four times a day (QID) | RESPIRATORY_TRACT | Status: DC | PRN

## 2015-03-09 MED ORDER — GABAPENTIN 300 MG PO CAPS: 300.0000 mg | ORAL_CAPSULE | Freq: Every day | ORAL | Status: DC

## 2015-03-09 MED ORDER — PANTOPRAZOLE SODIUM 40 MG PO TBEC
40.0000 mg | DELAYED_RELEASE_TABLET | Freq: Every day | ORAL | Status: DC
  Administered 2015-03-09: 40 mg via ORAL
  Filled 2015-03-09: qty 1

## 2015-03-09 MED ORDER — LEVOFLOXACIN 500 MG PO TABS: 500.0000 mg | ORAL_TABLET | ORAL | Status: DC

## 2015-03-09 MED ORDER — TORSEMIDE 20 MG PO TABS: 40.0000 mg | ORAL_TABLET | Freq: Every day | ORAL | Status: DC

## 2015-03-09 NOTE — Progress Notes (Signed)
Inpatient Diabetes Program Recommendations  AACE/ADA: New Consensus Statement on Inpatient Glycemic Control (2013)  Target Ranges:  Prepandial:   less than 140 mg/dL      Peak postprandial:   less than 180 mg/dL (1-2 hours)      Critically ill patients:  140 - 180 mg/dL  Results for [name], [name] (MRN [id number]) as of 03/09/2015 07:41  Ref. Range 03/08/2015 07:53 03/08/2015 12:15 03/08/2015 16:31 03/08/2015 20:27  Glucose-Capillary Latest Range: 70-99 mg/dL 119 (H) 241 (H) 234 (H) 329 (H)   Results for [name], [name] (MRN [id number]) as of 03/09/2015 07:41  Ref. Range 03/06/2015 06:23 03/09/2015 06:13  Hemoglobin A1C Latest Range: 4.8-5.6 % 7.6 (H)   Glucose Latest Range: 70-99 mg/dL  162 (H)   Diabetes history: DM2 Outpatient Diabetes medications: Lantus 68 units QHS, Humalog 10-13 units TID with meals Current orders for Inpatient glycemic control: Lantus 60 units QHS, Novolog 0-15 units Q4H  Inpatient Diabetes Program Recommendations Insulin - Meal Coverage: Note steroids are being tapered; currently ordered Prednisone 20 mg BID. Patient received a total of Novolog 24 units on 03/08/15 for correction and post prandial glucose consistently elevated. While inpatient and ordered steroids, please consider ordering Novolog 6 units TID with meals for meal coverage.  Thanks, Barnie Alderman, RN, MSN, CCRN, CDE Diabetes Coordinator Inpatient Diabetes Program [phone] (Team Pager from [place] to [place]) [phone] ([place] office) [phone] [facility] office)

## 2015-03-09 NOTE — Progress Notes (Signed)
Subjective: Interval History: Patient feels much better today. She doesn't have any difficulty breathing. Denies any chest pain no nausea or vomiting.  Objective: Vital signs in last 24 hours: Temp:  [97.8 F (36.6 C)-98.5 F (36.9 C)] 98.5 F (36.9 C) (04/05 0607) Pulse Rate:  [65-72] 65 (04/05 0607) Resp:  [16-18] 17 (04/05 0607) BP: (142-158)/(64-69) 153/64 mmHg (04/05 0607) SpO2:  [95 %-100 %] 98 % (04/05 0747) Weight:  [96.979 kg (213 lb 12.8 oz)] 96.979 kg (213 lb 12.8 oz) (04/05 0607) Weight change: -1.821 kg (-4 lb 0.2 oz)  Intake/Output from previous day: 04/04 0701 - 04/05 0700 In: 1200 [P.O.:1200] Out: 3575 [Urine:3575] Intake/Output this shift:    General appearance: alert, cooperative and no distress Resp: diminished breath sounds posterior - bilateral and dullness to percussion posterior - bilateral Cardio: regular rate and rhythm, S1, S2 normal, no murmur, click, rub or gallop GI: soft, non-tender; bowel sounds normal; no masses,  no organomegaly Extremities: edema Trace edema  Lab Results:  Recent Labs  03/08/15 0643 03/09/15 0613  WBC 23.4* 22.5*  HGB 11.0* 11.9*  HCT 33.1* 36.6  PLT 326 376   BMET:   Recent Labs  03/08/15 0643 03/09/15 0613  NA 140 141  K 3.6 4.0  CL 106 104  CO2 26 28  GLUCOSE 142* 162*  BUN 55* 60*  CREATININE 3.31* 3.36*  CALCIUM 8.7 8.8   No results for input(s): PTH in the last 72 hours. Iron Studies:   Recent Labs  03/07/15 0622  IRON 64  TIBC 265  FERRITIN 155    Studies/Results: Ct Soft Tissue Neck Wo Contrast  03/08/2015   CLINICAL DATA:  Sensation of fullness in the neck. Shortness of breath. History of esophageal stricture in chronic neck pain.  EXAM: CT NECK WITHOUT CONTRAST  TECHNIQUE: Multidetector CT imaging of the neck was performed following the standard protocol without intravenous contrast.  COMPARISON:  Multiple exams, including 01/19/2011  FINDINGS: Minimal prominence of the tonsillar pillars.  No glottic or supraglottic mass identified. No pathologic adenopathy in the neck or upper mediastinum. No significant narrowing of the nasopharyngeal airway.  Thyroid gland normal.  Coronary artery atherosclerosis.  Chronic right sphenoid sinusitis.  Cervical fusion with anterior plate and screw fixation at C3-C4-C5-C6. No swelling of the prevertebral/retropharyngeal soft tissues.  IMPRESSION: 1. A cause for the patient's globus sensation is not identified. 2. Mild tonsillar pillar prominence. 3. Coronary artery atherosclerosis. 4. Chronic right sphenoid sinusitis.   Electronically Signed   By: Van Clines M.D.   On: 03/08/2015 14:48   Ct Chest Wo Contrast  03/07/2015   CLINICAL DATA:  Worsening of chronic chest tightness. History of CHF, COPD and esophageal stricture. Initial encounter.  EXAM: CT CHEST WITHOUT CONTRAST  TECHNIQUE: Multidetector CT imaging of the chest was performed following the standard protocol without IV contrast.  COMPARISON:  Radiographs 03/05/2015 and 03/06/2015. No prior chest CT available. Abdominal MRI 02/06/2012 and renal ultrasound 02/24/2013 correlated.  FINDINGS: Mediastinum/Nodes: There are no enlarged mediastinal, hilar or axillary lymph nodes. Small mediastinal lymph nodes are noted. Hilar assessment is limited without contrast. The thyroid gland, trachea and esophagus demonstrate no significant findings. The heart size is normal. There is trace pericardial fluid.There is atherosclerosis of the aorta, great vessels and coronary arteries. Left circumflex stent noted.  Lungs/Pleura: There is no significant pleural effusion. Patchy ground-glass opacities are noted within the right lung, most notable within the right upper lobe on image 24 and in the right lower  lobe on images 20 for and 31. These are likely inflammatory. There are scattered tiny subpleural nodules bilaterally.  Upper abdomen: There is a tiny nonobstructing calculus in the upper pole of the left kidney.  Ill-defined low-density in the upper pole of the left kidney measures near water density and is probably a cyst.  Musculoskeletal/Chest wall: There is no chest wall mass or suspicious osseous finding.  IMPRESSION: 1. Patchy ground-glass opacities in the right lung, likely inflammatory. Atypical neoplasm considered much less likely. Consider follow-up by chest CT without contrast in 3 months to assess for resolution versus persistence. This recommendation follows the consensus statement: Recommendations for the Management of Subsolid Pulmonary Nodules Detected at CT: A Statement from the [place] as published in Radiology 2013; 266:304-317. 2. Scattered tiny subpleural nodules bilaterally, likely benign. These can be addressed on follow-up. 3. Atherosclerosis as described with coronary artery stent.   Electronically Signed   By: Richardean Sale M.D.   On: 03/07/2015 16:19    I have reviewed the patient's current medications.  Assessment/Plan: Problem #1 difficulty breathing: Seems to be improving. Patient presently nonoliguric. And she is feeling much better. Patient is on Demadex. Problem #2 hypertension: Her blood pressure seems to be better controlled. Problem #3 obesity Problem #4 Type 2 diabetes: Her blood sugar is better this morning. Problem #5 anemia: Her hemoglobin is stable and in range. Problem #6 hypokalemia: Patient is on potassium is in normal range. Problem #7 metabolic bone disease: Her calcium and her phosphorus is in range. Problem #8 leukocytosis:. Chest x-ray with patchy groundglass opacities within the right lung, no sure whether this is infectious. Patient remains afebrile and her white blood cell count is still high but shows some sign of improvement. Plan: We'll continue to hold Cozaar. We'll check her basic metabolic panel in the morning.     LOS: 4 days   Decklan Mau S 03/09/2015,8:26 AM

## 2015-03-09 NOTE — Discharge Summary (Signed)
Physician Discharge Summary  [name] KGM:[id number] DOB: 1950-12-25 DOA: 03/05/2015  PCP: Wardell Honour, MD  Admit date: 03/05/2015 Discharge date: 03/09/2015  Time spent: Greater than 30 minutes  Recommendations for Outpatient Follow-up:  1. Recommend outpatient PFTs. Patient was referred to pulmonologist Dr. Luan Pulling for outpatient evaluation. 2. Patient was referred to ENT, Dr. Benjamine Mola for evaluation of throat fullness. 3. Recommend follow-up of the patient's renal function: Deferred to nephrology. 4. Recommend follow-up noncontrasted CT scan of the chest in 3-6 months to assess right lung inflammatory changes.  Discharge Diagnoses:  1. Acute on chronic diastolic congestive heart failure. --- Patient's ejection fraction was 60-65% with grade 2 diastolic dysfunction per echo. --- She diuresed 9.2 L. --- Weight was 220 pounds on admission and 214 pounds at the time of discharge. 2. 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 3. Subjective chest tightness, throat fullness, and gagging. 4. Stage 4 chronic kidney disease. Hemodialysis could be started in the near future per nephrology. 5. Hypertension. 6. 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 7. Coronary artery disease. 8. Pleural nodules and small pericardial effusion seen on CT of the chest. --- Recommend follow-up CT of the chest in 3- 6 months. 9. Hypokalemia. 10. Normocytic anemia. 11. Leukocytosis, thought to be secondary to IV and oral steroid. 12. Morbid obesity.  Discharge Condition: Improved.  Diet recommendation: Heart healthy/carbohydrate modified.  Filed Weights   03/07/15 0500 03/08/15 0600 03/09/15 0607  Weight: 99.338 kg (219 lb) 98.8 kg (217 lb 13 oz) 96.979 kg (213 lb 12.8 oz)    History of present illness:  The patient is a 64 year old woman with a history of CAD, hypertension, diabetes mellitus, chronic bronchitis, and  diastolic heart failure, who presented to the emergency department on 03/05/2015 with a chief complaint of shortness of breath. In the ED, she was afebrile and hemodynamically stable. She was oxygenating 94% on supplemental oxygen. Her lab data were significant for a normal troponin I, BNP of 292, white blood cell count of 13.7, and potassium of 3.1. Her chest x-ray revealed low lung volumes with vascular congestion and probable pulmonary edema. She was admitted for further evaluation and management.     Hospital Course:   1. Acute on chronic diastolic congestive heart failure. [name] chest x-ray on admission revealed vascular congestion and probable pulmonary edema. Her BNP was 292, elevated in the setting of obesity. She was started on IV Lasix every 12 hours. The dose was decreased due to the patient's hyperglycemia and a concern for dehydration. She was maintained on her beta blocker and calcium channel blocker.  Her 2-D echo revealed an EF of 60-65% and grade 2 diastolic dysfunction. Her TSH was within normal limits. [name] peripheral edema resolved and her follow-up chest x-ray on 03/06/15 reveal less pulmonary edema.  Lasix was decreased from 40 mg IV every 12 hours to 20 mg IV every 12 hours in light of her worsening renal function. Nephrology was consulted and stopped the IV Lasix and started her on Demadex. Her ins and outs negative a total of 9.2 L. She was discharged on Demadex daily. She was instructed to follow-up with her primary cardiologist, Dr. Percival Spanish in 3-4 weeks.   Possible CAP and chronic obstructive bronchitis with mildacute on chronic exacerbation. The patient was given IV Solu-Medrol in the ED, but it was discontinued as it was thought that her dyspnea was more from CHF than COPD. Her follow-up chest x-ray revealed some peripheral interstitial  thickening which may have reflected bronchitis. IV Levaquin was started. She was started on albuterol nebulizer 3  times daily and Symbicort. Spiriva was added empirically. Steroid therapy was restarted with prednisone. Noncontrasted chest x-ray was noted for further evaluation. It revealed patchy ground glass opacities in the right lung likely inflammatory and scattered tiny subpleural nodules bilaterally -per radiology likely benign. Radiology recommend follow-up CT of the chest in a few months to assess for resolution versus persistence. Her white blood cell count had increased significantly , likely secondary to steroid therapy. She appears to be very sensitive to steroids and therefore, prednisone was quickly tapered. An appointment was made for her to follow-up with Dr. Luan Pulling as a new patient. The patient would benefit from outpatient PFTs.  Chest tightness and subjective gagging /fullness in throat.. Per my conversation with the patient and nephrology who has known the patient for years, this chest tightness and neck/throat complaints were not new. Apparently, the patient had remote C-spine surgery and her esophagus had to be manipulated. Also, she may have had some type of surgery on her "neck" as a child secondary to an "abnormal bone". She has of esophageal stricture, but per her account, her gastroenterologist Dr. Amedeo Plenty attempted to dilate it, but was met with resistance so he aborted the procedure and did not reattempt dilatation. The patient denied odontophagia or dysphagia and has no difficulty eating. The noncontrasted CT of the chest revealed inflammatory changes in the right lung and probable benign subpleural nodules ; not sure if these findings were playing a role. The echocardiogram did note a very small pericardial effusion , but I doubt this finding is playing a part. Left ventricular wall motion was normal. Her troponin I was negative 3. A noncontrasted CT of her neck was ordered for evaluation and it revealed mild tonsillar prominence, but no significant abnormalities. The patient was  started on Protonix. An appointment was made for her to see ENT, Dr. Benjamine Mola in the outpatient setting. She was started on a PPI empirically.   Stage III to stage IV chronic kidney disease. The patient is followed by Dr. Theador Hawthorne. Several days prior to the hospitalization, he stopped her Avapro and increased her Lasix as her creatinine had apparently increased in the outpatient setting. She was told that she had stage IV chronic kidney disease. Her creatinine on admission was 3.11. It has increased to 3.56.[name] Her urine output was nonoliguric. Nephrology was consulted and reported that hemodialysis will likely be needed in the future in this patient with progressive chronic kidney disease. Apparently this was discussed with the patient in the outpatient setting. Dr. Lowanda Foster discontinued IV Lasix and started her on Demadex.  Hypertension. She was restarted on Procardia and atenolol. Avapro was discontinued in the outpatient setting by nephrology. Her blood pressurestrended up and Nephrology started twice a day dosing of hydralazine.  Hypokalemia. [name] potassium was 3.1 on admission likely secondary to increased outpatient Lasix dosing. It fell to 2.9. She was started on potassium chloride supplementation. Her serum potassium improved.   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 The patient is treated with Lantus and Humalog chronically. Her blood glucose reached to greater than 450 following IV SoluMedrol given in the ED. She was treated accordingly with titrating the insulin-now given every 4 hours rather than every before meals and daily at bedtime. She was also briefly given gentle IV fluids. Her CBGs improved, but she appears to be very sensitive to steroids. Hemoglobin A1c was 7.3. She  was discharged on her usual dose of insulin as prednisone was tapered off at the time of discharge. Per recommendation by pharmacy, gabapentin was decreased to 300 mg a  day because of her compromised renal function.  Normocytic anemia. Her anemia is likely secondary to chronic disease/CKD. Her anemia panel was unremarkable with a ferritin was 155, total iron and TIBC were within normal limits, and vitamin B12 was within normal limits at 394.  Leukocytosis: This is likely from a combination of acute bronchitis and steroid therapy. She remained afebrile.       Procedures:  2-D echocardiogram 03/05/15: Study Conclusions - Left ventricle: The cavity size was normal. There was mild concentric hypertrophy. Systolic function was normal. The estimated ejection fraction was in the range of 60% to 65%. Wall motion was normal; there were no regional wall motion abnormalities. Features are consistent with a pseudonormal left ventricular filling pattern, with concomitant abnormal relaxation and increased filling pressure (grade 2 diastolic dysfunction). Doppler parameters are consistent with high ventricular filling pressure. - Mitral valve: Mildly thickened leaflets . There was mild regurgitation. - Left atrium: The atrium was moderately dilated. Volume/bsa, ES, (1-plane Simpson&'s, A2C): 37.8 ml/m^2. - Right atrium: The atrium was mildly dilated. - Pericardium, extracardiac: A very small pericardial effusion was seen. No evidence of tamponade physiology.  Consultations:  Nephrology  Discharge Exam: Filed Vitals:   03/09/15 0607  BP: 153/64  Pulse: 65  Temp: 98.5 F (36.9 C)  Resp: 17   oxygen saturation 98% on room air.   General: Obese 64 year old African-American woman, in no acute distress.  Oropharynx: no posterior exudates or erythema ; no signs of thrush.  Cardiovascular: S1, S2, with a soft systolic murmur.  Respiratory: Mostly clear with decreased breath sounds in the bases; breathing nonlabored at rest.  Abdomen: Obese, positive bowel sounds, soft, nontender, nondistended.  Musculoskeletal/extremities:  Resolution of lower extremity edema bilaterally. No acute hot red joints.   Neurologic: She is alert and oriented 3. Cranial nerves are intact.  Discharge Instructions   Discharge Instructions    Diet - low sodium heart healthy    Complete by:  As directed      Diet Carb Modified    Complete by:  As directed      Discharge instructions    Complete by:  As directed   Take medications as prescribed. Follow-up with all of your physicians.     Increase activity slowly    Complete by:  As directed           Current Discharge Medication List    START taking these medications   Details  albuterol (PROVENTIL HFA;VENTOLIN HFA) 108 (90 BASE) MCG/ACT inhaler Inhale 2 puffs into the lungs every 6 (six) hours as needed for wheezing or shortness of breath. Qty: 1 Inhaler, Refills: 2    hydrALAZINE (APRESOLINE) 50 MG tablet Take 1 tablet (50 mg total) by mouth 2 (two) times daily. New medicine for high blood pressure Qty: 60 tablet, Refills: 3    levofloxacin (LEVAQUIN) 500 MG tablet Take 1 tablet (500 mg total) by mouth every other day. Antibiotic; take as directed. Qty: 3 tablet, Refills: 0    pantoprazole (PROTONIX) 40 MG tablet Take 1 tablet (40 mg total) by mouth daily. For gastric acid reflux. Qty: 30 tablet, Refills: 3    torsemide (DEMADEX) 20 MG tablet Take 2 tablets (40 mg total) by mouth daily. New medicine to reduce fluid build up. Qty: 60 tablet, Refills: 3  CONTINUE these medications which have CHANGED   Details  budesonide-formoterol (SYMBICORT) 160-4.5 MCG/ACT inhaler Inhale 2 puffs into the lungs 2 (two) times daily as needed (shortness of breath). Qty: 1 Inhaler, Refills: 12    gabapentin (NEURONTIN) 300 MG capsule Take 1 capsule (300 mg total) by mouth at bedtime. DOSE DECREASED TO ONCE DAILY-AT BEDTIME BECAUSE OF YOUR KIDNEY FUNCTION      CONTINUE these medications which have NOT CHANGED   Details  aspirin 81 MG EC tablet Take 81 mg by mouth daily.       atenolol (TENORMIN) 50 MG tablet TAKE (1) TABLET TWICE DAILY. Qty: 60 tablet, Refills: 5    cholecalciferol (VITAMIN D) 1000 UNITS tablet Take 1,000 Units by mouth every morning.    CRESTOR 20 MG tablet TAKE 1 TABLET IN THE EVENING. Qty: 30 tablet, Refills: 2    Insulin Glargine (LANTUS) 100 UNIT/ML Solostar Pen Inject 72 Units into the skin daily at 10 pm. Qty: 30 mL, Refills: 5    insulin lispro (HUMALOG) 100 UNIT/ML injection Inject 0.1-0.13 mLs (10-13 Units total) into the skin 3 (three) times daily with meals. To be administered as directed per sliding scale: 90-150= 10  Units 151-200=11 units 201-250=12 units 251-300= 13 units  To call MD if levels are between 251-300 for three consecutive days Qty: 10 mL, Refills: 1    NIFEdipine (PROCARDIA XL/ADALAT-CC) 90 MG 24 hr tablet Take 90 mg by mouth every evening.     potassium chloride (K-DUR) 10 MEQ tablet Take 10 mEq by mouth daily.       STOP taking these medications     furosemide (LASIX) 40 MG tablet      valsartan (DIOVAN) 320 MG tablet        Allergies  Allergen Reactions  . Ace Inhibitors Other (See Comments)    cough  . Clopidogrel Bisulfate     REACTION: Sick, Headache, "Felt terrible"  . Codeine Other (See Comments)    REACTION: hallucinations  . Lisinopril Other (See Comments)    cough  . Penicillins     unknown   Follow-up Information    Follow up with Alonza Bogus, MD On 03/16/2015.   Specialty:  Pulmonary Disease   Why:  at 11:30 am   Contact information:   [place] [address] [phone]       Follow up with Ascencion Dike, MD On 04/01/2015.   Specialty:  Otolaryngology   Why:  at 2:20 pm   Contact information:   [address] [place] [address] [phone]       Follow up with Wardell Honour, MD.   Specialty:  Family Medicine   Why:  As needed in 1-2 weeks   Contact information:   [place] [address] [phone]        Follow up with Liana Gerold, MD.   Specialty:  Nephrology   Why:  Follow up as scheduled   Contact information:   [address] [address] [phone]       Follow up with Minus Breeding, MD. Schedule an appointment as soon as possible for a visit in 3 weeks.   Specialty:  Cardiology   Contact information:   [address] [place] [address] [phone]        The results of significant diagnostics from this hospitalization (including imaging, microbiology, ancillary and laboratory) are listed below for reference.  Significant Diagnostic Studies: Dg Chest 2 View  03/06/2015   CLINICAL DATA:  Followup CHF.  EXAM: CHEST  2 VIEW  COMPARISON:  03/05/2015  FINDINGS: Since prior exam, lung aeration has improved. Minimal interstitial thickening persists mostly in the lower lungs. There is no lung consolidation. There are minimal posterior lung base pleural effusions.  Cardiac silhouette is mildly enlarged. No mediastinal or hilar masses or evidence of adenopathy.  IMPRESSION: Significantly improved congestive heart failure. No new abnormalities. No evidence of pneumonia.   Electronically Signed   By: Lajean Manes M.D.   On: 03/06/2015 09:57   Ct Soft Tissue Neck Wo Contrast  03/08/2015   CLINICAL DATA:  Sensation of fullness in the neck. Shortness of breath. History of esophageal stricture in chronic neck pain.  EXAM: CT NECK WITHOUT CONTRAST  TECHNIQUE: Multidetector CT imaging of the neck was performed following the standard protocol without intravenous contrast.  COMPARISON:  Multiple exams, including 01/19/2011  FINDINGS: Minimal prominence of the tonsillar pillars. No glottic or supraglottic mass identified. No pathologic adenopathy in the neck or upper mediastinum. No significant narrowing of the nasopharyngeal airway.  Thyroid gland normal.  Coronary artery atherosclerosis.  Chronic right sphenoid sinusitis.  Cervical fusion with anterior  plate and screw fixation at C3-C4-C5-C6. No swelling of the prevertebral/retropharyngeal soft tissues.  IMPRESSION: 1. A cause for the patient's globus sensation is not identified. 2. Mild tonsillar pillar prominence. 3. Coronary artery atherosclerosis. 4. Chronic right sphenoid sinusitis.   Electronically Signed   By: Van Clines M.D.   On: 03/08/2015 14:48   Ct Chest Wo Contrast  03/07/2015   CLINICAL DATA:  Worsening of chronic chest tightness. History of CHF, COPD and esophageal stricture. Initial encounter.  EXAM: CT CHEST WITHOUT CONTRAST  TECHNIQUE: Multidetector CT imaging of the chest was performed following the standard protocol without IV contrast.  COMPARISON:  Radiographs 03/05/2015 and 03/06/2015. No prior chest CT available. Abdominal MRI 02/06/2012 and renal ultrasound 02/24/2013 correlated.  FINDINGS: Mediastinum/Nodes: There are no enlarged mediastinal, hilar or axillary lymph nodes. Small mediastinal lymph nodes are noted. Hilar assessment is limited without contrast. The thyroid gland, trachea and esophagus demonstrate no significant findings. The heart size is normal. There is trace pericardial fluid.There is atherosclerosis of the aorta, great vessels and coronary arteries. Left circumflex stent noted.  Lungs/Pleura: There is no significant pleural effusion. Patchy ground-glass opacities are noted within the right lung, most notable within the right upper lobe on image 24 and in the right lower lobe on images 20 for and 31. These are likely inflammatory. There are scattered tiny subpleural nodules bilaterally.  Upper abdomen: There is a tiny nonobstructing calculus in the upper pole of the left kidney. Ill-defined low-density in the upper pole of the left kidney measures near water density and is probably a cyst.  Musculoskeletal/Chest wall: There is no chest wall mass or suspicious osseous finding.  IMPRESSION: 1. Patchy ground-glass opacities in the right lung, likely inflammatory.  Atypical neoplasm considered much less likely. Consider follow-up by chest CT without contrast in 3 months to assess for resolution versus persistence. This recommendation follows the consensus statement: Recommendations for the Management of Subsolid Pulmonary Nodules Detected at CT: A Statement from the [place] as published in Radiology 2013; 266:304-317. 2. Scattered tiny subpleural nodules bilaterally, likely benign. These can be addressed on follow-up. 3. Atherosclerosis as described with coronary artery stent.   Electronically Signed   By: Richardean Sale M.D.   On: 03/07/2015 16:19  Dg Chest Port 1 View  03/05/2015   CLINICAL DATA:  Worsening shortness of breath for 2 days. Severely shortness of breath tonight.  EXAM: PORTABLE CHEST - 1 VIEW  COMPARISON:  01/21/2014  FINDINGS: Lung volumes are low. Bibasilar evaluation is limited by technique and soft tissue attenuation from body habitus. The heart is enlarged. There is vascular congestion and probable pulmonary edema. Question bibasilar opacities. There is no pneumothorax. Limited assessment for pleural effusion.  IMPRESSION: Low lung volumes with vascular congestion and probable pulmonary edema. Bibasilar assessment is limited by technique and soft tissue attenuation, question ill-defined bibasilar opacities. This may be related to atelectasis, pneumonia, versus pleural effusions, however frontal and lateral views are recommended for further evaluation when patient is able.   Electronically Signed   By: Jeb Levering M.D.   On: 03/05/2015 02:32    Microbiology: No results found for this or any previous visit (from the past 240 hour(s)).   Labs: Basic Metabolic Panel:  Recent Labs Lab 03/05/15 0221 03/05/15 0848 03/05/15 1735 03/06/15 2202 03/07/15 5427 03/08/15 0643 03/09/15 0613  NA 141  --   --  141 140 140 141  K 3.1*  --   --  2.9* 4.1 3.6 4.0  CL 105  --   --  105 107 106 104  CO2 27  --   --  '26 25 26 28  ' GLUCOSE  162*  --  380* 136* 184* 142* 162*  BUN 41*  --   --  45* 51* 55* 60*  CREATININE 3.11* 3.26*  --  3.37* 3.56* 3.31* 3.36*  CALCIUM 8.9  --   --  8.5 8.6 8.7 8.8  MG  --   --   --   --   --  2.3  --   PHOS  --   --   --  3.7 3.5 4.3  --    Liver Function Tests:  Recent Labs Lab 03/05/15 0221 03/06/15 0623 03/07/15 0621 03/08/15 0643  AST 38*  --   --   --   ALT 34  --   --   --   ALKPHOS 62  --   --   --   BILITOT 0.5  --   --   --   PROT 6.6  --   --   --   ALBUMIN 3.3* 2.9* 3.0* 3.0*   No results for input(s): LIPASE, AMYLASE in the last 168 hours. No results for input(s): AMMONIA in the last 168 hours. CBC:  Recent Labs Lab 03/05/15 0221 03/05/15 0848 03/06/15 0623 03/07/15 0622 03/08/15 0643 03/09/15 0613  WBC 13.7* 12.7* 17.9* 16.1* 23.4* 22.5*  NEUTROABS 9.5*  --   --   --   --  19.0*  HGB 12.0 10.8* 10.2* 10.8* 11.0* 11.9*  HCT 36.2 32.2* 31.3* 33.0* 33.1* 36.6  MCV 81.2 81.1 81.9 82.1 81.5 81.9  PLT 295 281 291 311 326 376   Cardiac Enzymes:  Recent Labs Lab 03/05/15 0221 03/05/15 0848 03/05/15 1314 03/05/15 1905  TROPONINI <0.03 <0.03 <0.03 <0.03   BNP: BNP (last 3 results)  Recent Labs  03/05/15 0214  BNP 292.0*    ProBNP (last 3 results) No results for input(s): PROBNP in the last 8760 hours.  CBG:  Recent Labs Lab 03/08/15 1215 03/08/15 1631 03/08/15 2027 03/09/15 0741 03/09/15 1157  GLUCAP 241* 234* 329* 134* 269*       Signed:  Lynell Kussman  [facility] 03/09/2015, 12:49 PM

## 2015-03-09 NOTE — Progress Notes (Signed)
Patient ambulated in the hallway without oxygen and maintained and 02 saturation of 98%. Discharge instructions given with understanding stated and prescriptions given.

## 2015-03-10 NOTE — Care Management Utilization Note (Signed)
UR completed 

## 2015-03-17 ENCOUNTER — Other Ambulatory Visit (HOSPITAL_COMMUNITY): Payer: Self-pay | Admitting: Radiology

## 2015-03-17 DIAGNOSIS — J449 Chronic obstructive pulmonary disease, unspecified: Secondary | ICD-10-CM

## 2015-03-19 ENCOUNTER — Ambulatory Visit (HOSPITAL_COMMUNITY)
Admission: RE | Admit: 2015-03-19 | Discharge: 2015-03-19 | Disposition: A | Discharge status: Home / Self Care | Payer: Medicare Other | Source: Ambulatory Visit | Attending: Pulmonary Disease | Admitting: Pulmonary Disease

## 2015-03-19 DIAGNOSIS — J449 Chronic obstructive pulmonary disease, unspecified: Secondary | ICD-10-CM | POA: Diagnosis not present

## 2015-03-19 MED ORDER — ALBUTEROL SULFATE (2.5 MG/3ML) 0.083% IN NEBU
2.5000 mg | INHALATION_SOLUTION | Freq: Once | RESPIRATORY_TRACT | Status: AC
  Administered 2015-03-19: 2.5 mg via RESPIRATORY_TRACT

## 2015-03-20 ENCOUNTER — Other Ambulatory Visit: Payer: Self-pay | Admitting: Family Medicine

## 2015-03-22 LAB — PULMONARY FUNCTION TEST
DL/VA % pred: 94 %
DL/VA: 4.27 ml/min/mmHg/L
DLCO unc % pred: 63 %
DLCO unc: 13.62 ml/min/mmHg
FEF 25-75 Post: 2.11 L/sec
FEF 25-75 Pre: 1.91 L/sec
FEF2575-%Change-Post: 10 %
FEF2575-%Pred-Post: 115 %
FEF2575-%Pred-Pre: 104 %
FEV1-%Change-Post: 1 %
FEV1-%Pred-Post: 93 %
FEV1-%Pred-Pre: 92 %
FEV1-Post: 1.74 L
FEV1-Pre: 1.7 L
FEV1FVC-%Change-Post: 1 %
FEV1FVC-%Pred-Pre: 106 %
FEV6-%Change-Post: 0 %
FEV6-%Pred-Post: 89 %
FEV6-%Pred-Pre: 88 %
FEV6-Post: 2.03 L
FEV6-Pre: 2.02 L
FEV6FVC-%Pred-Post: 104 %
FEV6FVC-%Pred-Pre: 104 %
FVC-%Change-Post: 0 %
FVC-%Pred-Post: 85 %
FVC-%Pred-Pre: 85 %
FVC-Post: 2.03 L
FVC-Pre: 2.02 L
Post FEV1/FVC ratio: 85 %
Post FEV6/FVC ratio: 100 %
Pre FEV1/FVC ratio: 84 %
Pre FEV6/FVC Ratio: 100 %

## 2015-03-24 ENCOUNTER — Encounter (HOSPITAL_COMMUNITY): Payer: Medicare Other

## 2015-04-01 ENCOUNTER — Ambulatory Visit (INDEPENDENT_AMBULATORY_CARE_PROVIDER_SITE_OTHER): Payer: Medicare Other | Admitting: Otolaryngology

## 2015-04-01 DIAGNOSIS — J343 Hypertrophy of nasal turbinates: Secondary | ICD-10-CM

## 2015-04-01 DIAGNOSIS — R1312 Dysphagia, oropharyngeal phase: Secondary | ICD-10-CM | POA: Diagnosis not present

## 2015-04-01 DIAGNOSIS — K219 Gastro-esophageal reflux disease without esophagitis: Secondary | ICD-10-CM

## 2015-04-22 ENCOUNTER — Ambulatory Visit (INDEPENDENT_AMBULATORY_CARE_PROVIDER_SITE_OTHER): Payer: Medicare Other | Admitting: Family Medicine

## 2015-04-22 ENCOUNTER — Encounter: Payer: Self-pay | Admitting: Family Medicine

## 2015-04-22 VITALS — BP 151/81 | HR 80 | Temp 97.4°F | Ht 62.0 in | Wt 209.0 lb

## 2015-04-22 DIAGNOSIS — E785 Hyperlipidemia, unspecified: Secondary | ICD-10-CM

## 2015-04-22 DIAGNOSIS — E1121 Type 2 diabetes mellitus with diabetic nephropathy: Secondary | ICD-10-CM

## 2015-04-22 DIAGNOSIS — I10 Essential (primary) hypertension: Secondary | ICD-10-CM | POA: Diagnosis not present

## 2015-04-22 NOTE — Progress Notes (Signed)
Subjective:    Patient ID: [name], female    DOB: Mar 27, 1951, 64 y.o.   MRN: [id number]  HPI 64 year old female who is here follow-up diabetes and COPD and congestive heart failure. She was recently hospitalized for 4 days for exacerbation of COPD and heart failure and was apparently vigorously diuresed as she has lost 12 pounds since her last visit 1 month ago.  She is checking her sugars at home. She is currently taking Humalog and Lantus. Her average fasting sugar is between 101 140 mg percent. Her A1c in the hospital last month was 7.8.  Since discharge from the hospital she has seen a nephrologist and they are considering placement of a shunt for dialysis. She has also seen pulmonologist.  Patient Active Problem List   Diagnosis Date Noted  . CAP (community acquired pneumonia) 03/08/2015  . Pleural nodules 03/08/2015  . Bronchitis, chronic obstructive w acute bronchitis 03/06/2015  . Chronic kidney disease, stage IV (severe) 03/06/2015  . Morbid obesity 03/06/2015  . Pericardial effusion 03/06/2015  . Acute on chronic diastolic CHF (congestive heart failure) 03/05/2015  . PAD (peripheral artery disease) 04/21/2014  . Leukocytosis 02/02/2014  . Hypokalemia 09/30/2013  . Dyspnea 09/29/2013  . Abnormal chest x-ray 09/29/2013  . Acute diastolic CHF (congestive heart failure) 09/29/2013  . Community acquired pneumonia 09/29/2013  . Type 2 diabetes mellitus with hypoglycemia 01/02/2012  . CKD (chronic kidney disease) 01/02/2012  . Diverticulosis 03/29/2011  . Diabetic neuropathy 03/29/2011  . Esophageal stricture 03/29/2011  . Vitamin D deficiency 03/29/2011  . Degenerative joint disease of left shoulder 03/29/2011  . Neck pain 03/29/2011  . Type II diabetes mellitus with nephropathy 04/19/2009  . Hyperlipidemia 04/19/2009  . HYPERTENSION, BENIGN 04/19/2009  . Coronary atherosclerosis 04/19/2009  . BRONCHITIS 04/19/2009   Outpatient Encounter Prescriptions as of  04/22/2015  Medication Sig  . albuterol (PROVENTIL HFA;VENTOLIN HFA) 108 (90 BASE) MCG/ACT inhaler Inhale 2 puffs into the lungs every 6 (six) hours as needed for wheezing or shortness of breath.  [name] aspirin 81 MG EC tablet Take 81 mg by mouth daily.    [name] atenolol (TENORMIN) 50 MG tablet TAKE (1) TABLET TWICE DAILY.  . budesonide-formoterol (SYMBICORT) 160-4.5 MCG/ACT inhaler Inhale 2 puffs into the lungs 2 (two) times daily as needed (shortness of breath).  . cholecalciferol (VITAMIN D) 1000 UNITS tablet Take 1,000 Units by mouth every morning.  [name] CRESTOR 20 MG tablet TAKE 1 TABLET IN THE EVENING.  [name] gabapentin (NEURONTIN) 300 MG capsule Take 1 capsule (300 mg total) by mouth at bedtime. DOSE DECREASED TO ONCE DAILY-AT BEDTIME BECAUSE OF YOUR KIDNEY FUNCTION  . hydrALAZINE (APRESOLINE) 50 MG tablet Take 1 tablet (50 mg total) by mouth 2 (two) times daily. New medicine for high blood pressure  . Insulin Glargine (LANTUS) 100 UNIT/ML Solostar Pen Inject 72 Units into the skin daily at 10 pm. (Patient taking differently: Inject 68 Units into the skin daily at 10 pm. )  . insulin lispro (HUMALOG) 100 UNIT/ML injection Inject 0.1-0.13 mLs (10-13 Units total) into the skin 3 (three) times daily with meals. To be administered as directed per sliding scale: 90-150= 10  Units 151-200=11 units 201-250=12 units 251-300= 13 units  To call MD if levels are between 251-300 for three consecutive days  . NIFEdipine (PROCARDIA XL/ADALAT-CC) 90 MG 24 hr tablet Take 90 mg by mouth every evening.   . pantoprazole (PROTONIX) 40 MG tablet Take 1 tablet (40 mg total) by mouth daily. For  gastric acid reflux.  . potassium chloride (K-DUR) 10 MEQ tablet Take 10 mEq by mouth daily.   [name] torsemide (DEMADEX) 20 MG tablet Take 2 tablets (40 mg total) by mouth daily. New medicine to reduce fluid build up.  . [DISCONTINUED] atenolol (TENORMIN) 50 MG tablet TAKE (1) TABLET TWICE DAILY.  . [DISCONTINUED] levofloxacin (LEVAQUIN)  500 MG tablet Take 1 tablet (500 mg total) by mouth every other day. Antibiotic; take as directed.   No facility-administered encounter medications on file as of 04/22/2015.       Review of Systems  Constitutional: Positive for unexpected weight change.  HENT: Negative.   Respiratory: Positive for shortness of breath.   Cardiovascular: Positive for leg swelling.  Gastrointestinal: Negative.   Neurological: Negative.   Psychiatric/Behavioral: Negative.        Objective:   Physical Exam  Constitutional: She is oriented to person, place, and time. She appears well-developed and well-nourished.  Cardiovascular: Normal rate, regular rhythm and intact distal pulses.   Pulmonary/Chest: Effort normal and breath sounds normal.  Musculoskeletal: Normal range of motion.  Neurological: She is alert and oriented to person, place, and time.  Psychiatric: She has a normal mood and affect.     BP 151/81 mmHg  Pulse 80  Temp(Src) 97.4 F (36.3 C) (Oral)  Ht 5\' 2"  (1.575 m)  Wt 209 lb (94.802 kg)  BMI 38.22 kg/m2        Assessment & Plan:  1. Type II diabetes mellitus with nephropathy Diabetes has improved based on A1c but her kidney function has continued to decline and dialysis is now being considered  2. HYPERTENSION, BENIGN Blood pressure is controlled with beta blocker and calcium channel blocker hydralazine and loop diuretic  3. Hyperlipidemia Last LDL was not quite at goal but she is on significant dose of Crestor 20 mg.

## 2015-04-26 ENCOUNTER — Telehealth: Payer: Self-pay | Admitting: Cardiology

## 2015-04-26 ENCOUNTER — Other Ambulatory Visit: Payer: Self-pay

## 2015-04-26 ENCOUNTER — Other Ambulatory Visit: Payer: Self-pay | Admitting: Family Medicine

## 2015-04-26 MED ORDER — GLUCOSE BLOOD VI STRP
ORAL_STRIP | Status: DC
Start: 2015-04-26 — End: 2015-04-28

## 2015-04-26 NOTE — Telephone Encounter (Signed)
New message     What dental office are you calling from? Dr  Wynetta Emery 1. What is your office phone and fax number? Fax [phone]  What type of procedure is the patient having performed? Routine cleaning 2. What date is procedure scheduled? Pt is in chair now 3. What is your question (ex. Antibiotics prior to procedure, holding medication-we need to know how long dentist wants pt to hold med)? Pt had stent in 2003---will she need to be premedicated

## 2015-04-26 NOTE — Telephone Encounter (Signed)
Dental office calling w/ patient in chair. Call resolved. Last stent placed in 2005 - no valve replacement surgery hx - prophylaxis not advised. Caller voiced understanding.

## 2015-04-27 NOTE — Telephone Encounter (Signed)
LMTCB which [employer]?

## 2015-04-28 ENCOUNTER — Other Ambulatory Visit: Payer: Self-pay

## 2015-04-28 MED ORDER — GLUCOSE BLOOD VI STRP: ORAL_STRIP | Status: DC

## 2015-04-29 ENCOUNTER — Ambulatory Visit (INDEPENDENT_AMBULATORY_CARE_PROVIDER_SITE_OTHER): Payer: Medicare Other | Admitting: Otolaryngology

## 2015-05-13 LAB — HM DIABETES EYE EXAM

## 2015-05-14 ENCOUNTER — Ambulatory Visit (INDEPENDENT_AMBULATORY_CARE_PROVIDER_SITE_OTHER): Payer: Medicare Other | Admitting: Cardiology

## 2015-05-14 ENCOUNTER — Encounter: Payer: Self-pay | Admitting: Cardiology

## 2015-05-14 VITALS — BP 148/72 | HR 62 | Ht 62.0 in | Wt 209.0 lb

## 2015-05-14 DIAGNOSIS — R911 Solitary pulmonary nodule: Secondary | ICD-10-CM

## 2015-05-14 NOTE — Patient Instructions (Addendum)
Your physician wants you to follow-up in: 6 Months You will receive a reminder letter in the mail two months in advance. If you don't receive a letter, please call our office to schedule the follow-up appointment.  You have been referred to [facility]  Non-Cardiac CT scanning, (CAT scanning), is a noninvasive, special x-ray that produces cross-sectional images of the body using x-rays and a computer. CT scans help physicians diagnose and treat medical conditions. For some CT exams, a contrast material is used to enhance visibility in the area of the body being studied. CT scans provide greater clarity and reveal more details than regular x-ray exams.

## 2015-05-14 NOTE — Progress Notes (Signed)
HPI The patient presents for follow up of CAD and diastolic heart failure.  Since I last saw her she was hospitalized with diastolic dysfunction. I reviewed these records. She was diuresed about 6 pounds. Unfortunately she has stage IV kidney disease with her creatinine increasing. She is apparently going to have a dialysis fistula placed. Since she went home her weights to been staying down. She does get dyspneic with any activities. She went to her pulmonologist and he thought this was cardiac but did treat her with a Z-Pak and some steroid-induced and she said the throat tightness and discomfort that she had got better. She's not having PND or orthopnea symptoms. She's had no new swelling. She does occasionally get chest discomfort but thinks this is unchanged from previous. She is limited by her breathing. She continues to have leg discomfort but she did not see the vascular surgeons because she could not get any kind of dye studies. She's trying to watch her diet very confused about what she should be eating with her renal insufficiency, diabetes coronary disease as well as diastolic dysfunction.  Of note when I reviewed her recent hospitalization records I saw her EF was 65% with a very small pericardial effusion.    Allergies  Allergen Reactions  . Ace Inhibitors Other (See Comments)    cough  . Clopidogrel Bisulfate     REACTION: Sick, Headache, "Felt terrible"  . Codeine Other (See Comments)    REACTION: hallucinations  . Lisinopril Other (See Comments)    cough  . Penicillins     unknown    Current Outpatient Prescriptions  Medication Sig Dispense Refill  . albuterol (PROVENTIL HFA;VENTOLIN HFA) 108 (90 BASE) MCG/ACT inhaler Inhale 2 puffs into the lungs every 6 (six) hours as needed for wheezing or shortness of breath. 1 Inhaler 2  . aspirin 81 MG EC tablet Take 81 mg by mouth daily.      [name] atenolol (TENORMIN) 50 MG tablet TAKE (1) TABLET TWICE DAILY. 60 tablet 5  .  budesonide-formoterol (SYMBICORT) 160-4.5 MCG/ACT inhaler Inhale 2 puffs into the lungs 2 (two) times daily as needed (shortness of breath). 1 Inhaler 12  . cholecalciferol (VITAMIN D) 1000 UNITS tablet Take 1,000 Units by mouth every morning.    [name] CRESTOR 20 MG tablet TAKE 1 TABLET IN THE EVENING. 30 tablet 2  . gabapentin (NEURONTIN) 300 MG capsule Take 1 capsule (300 mg total) by mouth at bedtime. DOSE DECREASED TO ONCE DAILY-AT BEDTIME BECAUSE OF YOUR KIDNEY FUNCTION    . glucose blood test strip Test 4 times daily 100 each 2  . hydrALAZINE (APRESOLINE) 50 MG tablet Take 1 tablet (50 mg total) by mouth 2 (two) times daily. New medicine for high blood pressure 60 tablet 3  . Insulin Glargine (LANTUS) 100 UNIT/ML Solostar Pen Inject 72 Units into the skin daily at 10 pm. (Patient taking differently: Inject 68 Units into the skin daily at 10 pm. ) 30 mL 5  . insulin lispro (HUMALOG) 100 UNIT/ML injection Inject 0.1-0.13 mLs (10-13 Units total) into the skin 3 (three) times daily with meals. To be administered as directed per sliding scale: 90-150= 10  Units 151-200=11 units 201-250=12 units 251-300= 13 units  To call MD if levels are between 251-300 for three consecutive days 10 mL 1  . NIFEdipine (PROCARDIA XL/ADALAT-CC) 90 MG 24 hr tablet Take 90 mg by mouth every evening.     . pantoprazole (PROTONIX) 40 MG tablet Take 1  tablet (40 mg total) by mouth daily. For gastric acid reflux. 30 tablet 3  . potassium chloride (K-DUR) 10 MEQ tablet Take 10 mEq by mouth daily.     [name] torsemide (DEMADEX) 20 MG tablet Take 2 tablets (40 mg total) by mouth daily. New medicine to reduce fluid build up. 60 tablet 3   No current facility-administered medications for this visit.    Past Medical History  Diagnosis Date  . Esophageal stricture   . Hyperlipidemia   . Hypertension   . Bronchitis   . CAD (coronary artery disease)     s/p Stenting in 2005;  [facility] 7/09: Vigorous LV function, 2-3+ MR, RI 40%, RI  stent patent, proximal-mid RCA 40-50%;  Echo 7/09:  EF 55-65%, trivial MR;  Myoview 11/18/12: EF 66%, normal LV wall motion, mild to moderate anterior ischemia - reviewed by [facility] and felt to rep breast atten (low risk);  Echo 6/14: mild LVH, EF 55-65%, Gr 2 DD, PASP 44, trivial eff   . Arthritis   . IDDM (insulin dependent diabetes mellitus)   . Diverticulosis   . Diabetic neuropathy   . Vitamin D deficiency   . Degenerative joint disease     left shoulder  . Chronic neck pain   . Shortness of breath     at night  . Headache(784.0)   . Anemia     hx of  . Stroke     "mini- years ago"  . Chronic kidney disease     CREATININE IS UP--GOING TO KIDNEY MD   . CHF (congestive heart failure)   . Pericardial effusion 03/06/2015    Very small per echo ; pleural nodules seen on CT chest.  . Acute on chronic diastolic CHF (congestive heart failure) 03/05/2015    Grade 2. EF 60-65%    Past Surgical History  Procedure Laterality Date  . Bladder surgery      Tack  . Partial hysterectomy    . Shoulder surgery Right     Rotator cuff  . Cervical spine surgery  2012    8 screws  . Abdominal hysterectomy    . Laminectomy  12/29/2011  . Cardiac stents    . Lumbar laminectomy/decompression microdiscectomy  12/29/2011    Procedure: LUMBAR LAMINECTOMY/DECOMPRESSION MICRODISCECTOMY;  Surgeon: Floyce Stakes, MD;  Location: [place] [facility];  Service: Neurosurgery;  Laterality: N/A;  Lumbar Two through Lumbar Five Laminectomies/Cellsaver  . Back surgery      2007 & 2013  . Cystoscopy with injection N/A 06/03/2013    Procedure: MACROPLASTIQUE  INJECTION ;  Surgeon: Malka So, MD;  Location: [facility];  Service: Urology;  Laterality: N/A;  . Cardiac catheterization  2003, 2005, 2009    1 stent  . Cataract extraction w/phaco Right 05/04/2014    Procedure: CATARACT EXTRACTION PHACO AND INTRAOCULAR LENS PLACEMENT (IOC);  Surgeon: Williams Che, MD;  Location: [facility];  Service: Ophthalmology;  Laterality: Right;   CDE 1.47  . Cataract extraction w/phaco Left 07/20/2014    Procedure: CATARACT EXTRACTION WITH PHACO AND INTRAOCULAR LENS PLACEMENT LEFT EYE CDE=1.79;  Surgeon: Williams Che, MD;  Location: [facility];  Service: Ophthalmology;  Laterality: Left;    ROS:  Positive for insomnia. Otherwise as stated in the HPI and negative for all other systems.  PHYSICAL EXAM BP 148/72 mmHg  Pulse 62  Ht 5\' 2"  (1.575 m)  Wt 209 lb (94.802 kg)  BMI 38.22 kg/m2 GENERAL:  Well appearing HEENT:  Pupils equal  round and reactive, fundi not visualized, oral mucosa unremarkable NECK:  No jugular venous distention, waveform within normal limits, carotid upstroke brisk and symmetric, no bruits, no thyromegaly LYMPHATICS:  No cervical, inguinal adenopathy LUNGS:  Clear to auscultation bilaterally BACK:  No CVA tenderness CHEST:  Unremarkable HEART:  PMI not displaced or sustained,S1 and S2 within normal limits, no S3, no S4, no clicks, no rubs, no murmurs ABD:  Flat, positive bowel sounds normal in frequency in pitch, no bruits, no rebound, no guarding, no midline pulsatile mass, no hepatomegaly, no splenomegaly EXT:  2 plus pulses upper, decreased dorsalis pedis and posterior tibialis bilaterally, mild bilateral lower extremity edema, no cyanosis no clubbing SKIN:  No rashes no nodules, keloids   ASSESSMENT AND PLAN   CAD -  The patient has no new sypmtoms since stress testing 2013.  No further cardiovascular testing is indicated.  We will continue with aggressive risk reduction and meds as listed.  HYPERTENSION, BENIGN -  The blood pressure is elevated. However, this is unusual and ended it usually well controlled at other medical appointments and at home. I will make no change her medicines..  DYSLIPIDEMIA - Per Wardell Honour, MD.  Her LDL is not at target. She's not taking her Crestor routinely and I told her she needs to do this. She can then have her lipids repeated and she might need 40 mg.  CHRONIC  DIASTOLIC - She is now weighing herself routinely with a home health machine that transmits her weights. She's been told how to take extra diabetic as needed and she is doing this rarely. Work on salt and fluid restriction as well as blood pressure control.  LEG PAIN - She does have some vascular disease but could not get contrast. We will treat this conservatively. She's not having resting pain.  ABNORMAL CT - The patient was found to have some small pleural nodes and was to have 3-6 month follow-up. We will put her in for a 6 month noncontrast CT of her chest.

## 2015-05-24 ENCOUNTER — Telehealth: Payer: Self-pay | Admitting: Cardiology

## 2015-05-24 ENCOUNTER — Other Ambulatory Visit: Payer: Self-pay

## 2015-05-24 NOTE — Telephone Encounter (Signed)
I do not see this on her med list.

## 2015-05-24 NOTE — Telephone Encounter (Signed)
°  1. Which medications need to be refilled? Nitroglycerin-tightness in chest off and on-not at present  2. Which pharmacy is medication to be sent to?[facility]-[phone]  3. Do they need a 30 day or 90 day supply? 4 bottles  4. Would they like a call back once the medication has been sent to the pharmacy? yes

## 2015-05-25 ENCOUNTER — Other Ambulatory Visit: Payer: Self-pay

## 2015-05-25 MED ORDER — NITROGLYCERIN 0.4 MG SL SUBL: 0.4000 mg | SUBLINGUAL_TABLET | SUBLINGUAL | Status: DC | PRN

## 2015-05-25 NOTE — Telephone Encounter (Signed)
Medication ordered. Patient informed.

## 2015-05-25 NOTE — Telephone Encounter (Signed)
OK to fill NTG SL.

## 2015-05-31 ENCOUNTER — Encounter: Payer: Self-pay | Admitting: Family Medicine

## 2015-05-31 ENCOUNTER — Other Ambulatory Visit: Payer: Self-pay

## 2015-05-31 ENCOUNTER — Ambulatory Visit (INDEPENDENT_AMBULATORY_CARE_PROVIDER_SITE_OTHER): Payer: Medicare Other | Admitting: Family Medicine

## 2015-05-31 ENCOUNTER — Ambulatory Visit (INDEPENDENT_AMBULATORY_CARE_PROVIDER_SITE_OTHER): Payer: Medicare Other

## 2015-05-31 VITALS — BP 137/70 | HR 71 | Temp 97.7°F | Ht 62.0 in | Wt 211.0 lb

## 2015-05-31 DIAGNOSIS — R0602 Shortness of breath: Secondary | ICD-10-CM

## 2015-05-31 DIAGNOSIS — R05 Cough: Secondary | ICD-10-CM

## 2015-05-31 DIAGNOSIS — R059 Cough, unspecified: Secondary | ICD-10-CM

## 2015-05-31 DIAGNOSIS — R509 Fever, unspecified: Secondary | ICD-10-CM | POA: Diagnosis not present

## 2015-05-31 DIAGNOSIS — J181 Lobar pneumonia, unspecified organism: Principal | ICD-10-CM

## 2015-05-31 DIAGNOSIS — J189 Pneumonia, unspecified organism: Secondary | ICD-10-CM | POA: Diagnosis not present

## 2015-05-31 LAB — POCT CBC
Granulocyte percent: 71.5 %G (ref 37–80)
HCT, POC: 40.8 % (ref 37.7–47.9)
Hemoglobin: 12.7 g/dL (ref 12.2–16.2)
Lymph, poc: 3.7 — AB (ref 0.6–3.4)
MCH, POC: 25.5 pg — AB (ref 27–31.2)
MCHC: 31.2 g/dL — AB (ref 31.8–35.4)
MCV: 81.7 fL (ref 80–97)
MPV: 8.3 fL (ref 0–99.8)
POC Granulocyte: 10.2 — AB (ref 2–6.9)
POC LYMPH PERCENT: 25.8 %L (ref 10–50)
Platelet Count, POC: 318 10*3/uL (ref 142–424)
RBC: 4.99 M/uL (ref 4.04–5.48)
RDW, POC: 16 %
WBC: 14.3 10*3/uL — AB (ref 4.6–10.2)

## 2015-05-31 MED ORDER — LEVALBUTEROL HCL 1.25 MG/3ML IN NEBU
1.2500 mg | INHALATION_SOLUTION | Freq: Once | RESPIRATORY_TRACT | Status: AC
  Administered 2015-05-31: 1.25 mg via RESPIRATORY_TRACT

## 2015-05-31 MED ORDER — MOXIFLOXACIN HCL 400 MG PO TABS: 400.0000 mg | ORAL_TABLET | Freq: Every day | ORAL | Status: DC

## 2015-05-31 MED ORDER — LEVALBUTEROL HCL 1.25 MG/0.5ML IN NEBU: 1.2500 mg | INHALATION_SOLUTION | Freq: Once | RESPIRATORY_TRACT | Status: DC

## 2015-05-31 MED ORDER — BETAMETHASONE SOD PHOS & ACET 6 (3-3) MG/ML IJ SUSP
6.0000 mg | Freq: Once | INTRAMUSCULAR | Status: AC
  Administered 2015-05-31: 6 mg via INTRAMUSCULAR

## 2015-05-31 NOTE — Patient Instructions (Addendum)
Be sure you use the symbicort twice every day.  Use the albuterol up to two puffs every 4 hours for wheezing.

## 2015-05-31 NOTE — Progress Notes (Signed)
Subjective:  Patient ID: [name], female    DOB: 1951/01/29  Age: 64 y.o. MRN: [id number]  CC: URI   HP [name] presents for fever, cough dyspnea X 3 days. At onset she just had some congestion and sore throat. Cough started a day later. Starting this morning she has been wheezing and short of breath as well. Patient has had frequent pneumonias in the past. She has multiple diagnoses and seems to get everything. Of note is that she also has problems with congestive heart failure and has occasional dyspnea with that too. For today she denies edema of the legs. Anterior chest is tight.  History [name] has a past medical history of Esophageal stricture; Hyperlipidemia; Hypertension; Bronchitis; CAD (coronary artery disease); Arthritis; IDDM (insulin dependent diabetes mellitus); Diverticulosis; Diabetic neuropathy; Vitamin D deficiency; Degenerative joint disease; Chronic neck pain; Headache(784.0); Anemia; Stroke; Chronic kidney disease; CHF (congestive heart failure); Pericardial effusion (03/06/2015); and Acute on chronic diastolic CHF (congestive heart failure) (03/05/2015).   She has past surgical history that includes Bladder surgery; Partial hysterectomy; Shoulder surgery (Right); Cervical spine surgery (2012); Abdominal hysterectomy; Laminectomy (12/29/2011); cardiac stents; Lumbar laminectomy/decompression microdiscectomy (12/29/2011); Back surgery; Cystoscopy with injection (N/A, 06/03/2013); Cardiac catheterization (2003, 2005, 2009); Cataract extraction w/PHACO (Right, 05/04/2014); and Cataract extraction w/PHACO (Left, 07/20/2014).   Her family history includes Heart disease in her other; Hypertension in her father.She reports that she quit smoking about 13 years ago. She has never used smokeless tobacco. She reports that she drinks alcohol. She reports that she does not use illicit drugs.  Outpatient Prescriptions Prior to Visit  Medication Sig Dispense Refill  . albuterol  (PROVENTIL HFA;VENTOLIN HFA) 108 (90 BASE) MCG/ACT inhaler Inhale 2 puffs into the lungs every 6 (six) hours as needed for wheezing or shortness of breath. 1 Inhaler 2  . aspirin 81 MG EC tablet Take 81 mg by mouth daily.      [name] atenolol (TENORMIN) 50 MG tablet TAKE (1) TABLET TWICE DAILY. 60 tablet 5  . budesonide-formoterol (SYMBICORT) 160-4.5 MCG/ACT inhaler Inhale 2 puffs into the lungs 2 (two) times daily as needed (shortness of breath). 1 Inhaler 12  . cholecalciferol (VITAMIN D) 1000 UNITS tablet Take 1,000 Units by mouth every morning.    [name] CRESTOR 20 MG tablet TAKE 1 TABLET IN THE EVENING. 30 tablet 2  . gabapentin (NEURONTIN) 300 MG capsule Take 1 capsule (300 mg total) by mouth at bedtime. DOSE DECREASED TO ONCE DAILY-AT BEDTIME BECAUSE OF YOUR KIDNEY FUNCTION    . glucose blood test strip Test 4 times daily 100 each 2  . hydrALAZINE (APRESOLINE) 50 MG tablet Take 1 tablet (50 mg total) by mouth 2 (two) times daily. New medicine for high blood pressure 60 tablet 3  . Insulin Glargine (LANTUS) 100 UNIT/ML Solostar Pen Inject 72 Units into the skin daily at 10 pm. (Patient taking differently: Inject 68 Units into the skin daily at 10 pm. ) 30 mL 5  . insulin lispro (HUMALOG) 100 UNIT/ML injection Inject 0.1-0.13 mLs (10-13 Units total) into the skin 3 (three) times daily with meals. To be administered as directed per sliding scale: 90-150= 10  Units 151-200=11 units 201-250=12 units 251-300= 13 units  To call MD if levels are between 251-300 for three consecutive days 10 mL 1  . NIFEdipine (PROCARDIA XL/ADALAT-CC) 90 MG 24 hr tablet Take 90 mg by mouth every evening.     . nitroGLYCERIN (NITROSTAT) 0.4 MG SL tablet Place 1 tablet (0.4  mg total) under the tongue every 5 (five) minutes as needed for chest pain. 25 tablet 5  . pantoprazole (PROTONIX) 40 MG tablet Take 1 tablet (40 mg total) by mouth daily. For gastric acid reflux. 30 tablet 3  . potassium chloride (K-DUR) 10 MEQ tablet Take  10 mEq by mouth daily.     [name] torsemide (DEMADEX) 20 MG tablet Take 2 tablets (40 mg total) by mouth daily. New medicine to reduce fluid build up. 60 tablet 3   No facility-administered medications prior to visit.    ROS Review of Systems  Constitutional: Positive for fever, chills, appetite change and fatigue. Negative for diaphoresis and unexpected weight change.  HENT: Positive for sore throat. Negative for congestion, ear pain, hearing loss, postnasal drip, rhinorrhea, sneezing and trouble swallowing.   Eyes: Negative for pain.  Respiratory: Positive for wheezing. Negative for cough, chest tightness and shortness of breath.   Cardiovascular: Negative for chest pain and palpitations.  Gastrointestinal: Negative for nausea, vomiting, abdominal pain, diarrhea and constipation.  Genitourinary: Negative for dysuria, frequency and menstrual problem.  Musculoskeletal: Negative for joint swelling and arthralgias.  Skin: Negative for rash.  Neurological: Negative for dizziness, weakness, numbness and headaches.  Psychiatric/Behavioral: Negative for dysphoric mood and agitation.    Objective:  BP 137/70 mmHg  Pulse 71  Temp(Src) 97.7 F (36.5 C) (Oral)  Ht 5\' 2"  (1.575 m)  Wt 211 lb (95.709 kg)  BMI 38.58 kg/m2  SpO2 93%  BP Readings from Last 3 Encounters:  05/31/15 137/70  05/14/15 148/72  04/22/15 151/81    Wt Readings from Last 3 Encounters:  05/31/15 211 lb (95.709 kg)  05/14/15 209 lb (94.802 kg)  04/22/15 209 lb (94.802 kg)     Physical Exam  Constitutional: She is oriented to person, place, and time. She appears well-developed and well-nourished. She appears distressed (mild audible wheezing from across the exam room noted).  HENT:  Head: Normocephalic and atraumatic.  Right Ear: External ear normal.  Left Ear: External ear normal.  Nose: Nose normal.  Mouth/Throat: Oropharynx is clear and moist.  Eyes: Conjunctivae and EOM are normal. Pupils are equal, round, and  reactive to light.  Neck: Normal range of motion. Neck supple. No thyromegaly present.  Cardiovascular: Normal rate, regular rhythm and normal heart sounds.   No murmur heard. Pulmonary/Chest: She is in respiratory distress. She has wheezes. She has rales. She exhibits no tenderness.  Abdominal: Soft. Bowel sounds are normal. She exhibits no distension. There is no tenderness.  Lymphadenopathy:    She has no cervical adenopathy.  Neurological: She is alert and oriented to person, place, and time. She has normal reflexes.  Skin: Skin is warm and dry.  Psychiatric: She has a normal mood and affect. Her behavior is normal. Judgment and thought content normal.    Lab Results  Component Value Date   HGBA1C 7.8* 03/08/2015   HGBA1C 7.6* 03/06/2015   HGBA1C 8.1 01/12/2015    Lab Results  Component Value Date   WBC 14.3* 05/31/2015   HGB 12.7 05/31/2015   HCT 40.8 05/31/2015   PLT 376 03/09/2015   GLUCOSE 162* 03/09/2015   CHOL 235* 01/12/2015   TRIG 265* 01/12/2015   HDL 45 01/12/2015   LDLCALC 137* 01/12/2015   ALT 34 03/05/2015   AST 38* 03/05/2015   NA 141 03/09/2015   K 4.0 03/09/2015   CL 104 03/09/2015   CREATININE 3.36* 03/09/2015   BUN 60* 03/09/2015   CO2 28 03/09/2015  TSH 2.261 03/05/2015   INR 1.0 06/17/2008   HGBA1C 7.8* 03/08/2015    No results found.  Assessment & Plan:   Kanesha was seen today for uri.  Diagnoses and all orders for this visit:  RLL pneumonia Orders: -     moxifloxacin (AVELOX) 400 MG tablet; Take 1 tablet (400 mg total) by mouth daily.  SOB (shortness of breath) Orders: -     POCT CBC -     DG Chest 2 View -     moxifloxacin (AVELOX) 400 MG tablet; Take 1 tablet (400 mg total) by mouth daily.  Other specified fever Orders: -     POCT CBC -     DG Chest 2 View  Cough Orders: -     POCT CBC -     DG Chest 2 View -     moxifloxacin (AVELOX) 400 MG tablet; Take 1 tablet (400 mg total) by mouth daily.   I am having Ms.  Baker start on moxifloxacin. I am also having her maintain her aspirin, NIFEdipine, cholecalciferol, potassium chloride, atenolol, insulin lispro, Insulin Glargine, CRESTOR, gabapentin, hydrALAZINE, pantoprazole, torsemide, albuterol, budesonide-formoterol, glucose blood, and nitroGLYCERIN.  Meds ordered this encounter  Medications  . moxifloxacin (AVELOX) 400 MG tablet    Sig: Take 1 tablet (400 mg total) by mouth daily.    Dispense:  14 tablet    Refill:  0   CXR - Prelim - RLL infiltrate  Be sure you use the symbicort twice every day.  Use the albuterol up to two puffs every 4 hours for wheezing.  Follow-up: Return in about 3 days (around 06/03/2015).  [name], M.D.

## 2015-05-31 NOTE — Addendum Note (Signed)
Addended by: Marin Olp on: 05/31/2015 12:14 PM   Modules accepted: Orders, SmartSet

## 2015-06-03 ENCOUNTER — Encounter: Payer: Self-pay | Admitting: Family Medicine

## 2015-06-03 ENCOUNTER — Ambulatory Visit (INDEPENDENT_AMBULATORY_CARE_PROVIDER_SITE_OTHER): Payer: Medicare Other | Admitting: Family Medicine

## 2015-06-03 VITALS — BP 141/72 | HR 69 | Temp 97.0°F | Ht 62.0 in | Wt 209.6 lb

## 2015-06-03 DIAGNOSIS — E1121 Type 2 diabetes mellitus with diabetic nephropathy: Secondary | ICD-10-CM

## 2015-06-03 DIAGNOSIS — N184 Chronic kidney disease, stage 4 (severe): Secondary | ICD-10-CM

## 2015-06-03 DIAGNOSIS — J189 Pneumonia, unspecified organism: Secondary | ICD-10-CM | POA: Diagnosis not present

## 2015-06-03 DIAGNOSIS — J181 Lobar pneumonia, unspecified organism: Principal | ICD-10-CM

## 2015-06-03 MED ORDER — HYDROCODONE-HOMATROPINE 5-1.5 MG/5ML PO SYRP: 5.0000 mL | ORAL_SOLUTION | Freq: Three times a day (TID) | ORAL | Status: DC | PRN

## 2015-06-03 NOTE — Progress Notes (Signed)
Subjective:  Patient ID: [name], female    DOB: August 18, 1951  Age: 64 y.o. MRN: [id number]  CC: Pneumonia   HPI [name] presents for left short of breath. Coughing less. More comfortable. She's been taking robotics without side effects. Denies any tendon or joint pain. She has not had fever. The cough has been nonproductive but there feels like there is still something in her chest. Minimal chest discomfort only.  History [name] has a past medical history of Esophageal stricture; Hyperlipidemia; Hypertension; Bronchitis; CAD (coronary artery disease); Arthritis; IDDM (insulin dependent diabetes mellitus); Diverticulosis; Diabetic neuropathy; Vitamin D deficiency; Degenerative joint disease; Chronic neck pain; Headache(784.0); Anemia; Stroke; Chronic kidney disease; CHF (congestive heart failure); Pericardial effusion (03/06/2015); and Acute on chronic diastolic CHF (congestive heart failure) (03/05/2015).   She has past surgical history that includes Bladder surgery; Partial hysterectomy; Shoulder surgery (Right); Cervical spine surgery (2012); Abdominal hysterectomy; Laminectomy (12/29/2011); cardiac stents; Lumbar laminectomy/decompression microdiscectomy (12/29/2011); Back surgery; Cystoscopy with injection (N/A, 06/03/2013); Cardiac catheterization (2003, 2005, 2009); Cataract extraction w/PHACO (Right, 05/04/2014); and Cataract extraction w/PHACO (Left, 07/20/2014).   Her family history includes Heart disease in her other; Hypertension in her father.She reports that she quit smoking about 13 years ago. She has never used smokeless tobacco. She reports that she drinks alcohol. She reports that she does not use illicit drugs.  Outpatient Prescriptions Prior to Visit  Medication Sig Dispense Refill  . albuterol (PROVENTIL HFA;VENTOLIN HFA) 108 (90 BASE) MCG/ACT inhaler Inhale 2 puffs into the lungs every 6 (six) hours as needed for wheezing or shortness of breath. 1 Inhaler 2  . aspirin 81  MG EC tablet Take 81 mg by mouth daily.      [name] atenolol (TENORMIN) 50 MG tablet TAKE (1) TABLET TWICE DAILY. 60 tablet 5  . budesonide-formoterol (SYMBICORT) 160-4.5 MCG/ACT inhaler Inhale 2 puffs into the lungs 2 (two) times daily as needed (shortness of breath). 1 Inhaler 12  . cholecalciferol (VITAMIN D) 1000 UNITS tablet Take 1,000 Units by mouth every morning.    [name] CRESTOR 20 MG tablet TAKE 1 TABLET IN THE EVENING. 30 tablet 2  . gabapentin (NEURONTIN) 300 MG capsule Take 1 capsule (300 mg total) by mouth at bedtime. DOSE DECREASED TO ONCE DAILY-AT BEDTIME BECAUSE OF YOUR KIDNEY FUNCTION    . glucose blood test strip Test 4 times daily 100 each 2  . hydrALAZINE (APRESOLINE) 50 MG tablet Take 1 tablet (50 mg total) by mouth 2 (two) times daily. New medicine for high blood pressure 60 tablet 3  . Insulin Glargine (LANTUS) 100 UNIT/ML Solostar Pen Inject 72 Units into the skin daily at 10 pm. (Patient taking differently: Inject 68 Units into the skin daily at 10 pm. ) 30 mL 5  . insulin lispro (HUMALOG) 100 UNIT/ML injection Inject 0.1-0.13 mLs (10-13 Units total) into the skin 3 (three) times daily with meals. To be administered as directed per sliding scale: 90-150= 10  Units 151-200=11 units 201-250=12 units 251-300= 13 units  To call MD if levels are between 251-300 for three consecutive days 10 mL 1  . NIFEdipine (PROCARDIA XL/ADALAT-CC) 90 MG 24 hr tablet Take 90 mg by mouth every evening.     . nitroGLYCERIN (NITROSTAT) 0.4 MG SL tablet Place 1 tablet (0.4 mg total) under the tongue every 5 (five) minutes as needed for chest pain. 25 tablet 5  . pantoprazole (PROTONIX) 40 MG tablet Take 1 tablet (40 mg total) by mouth daily. For gastric acid  reflux. 30 tablet 3  . potassium chloride (K-DUR) 10 MEQ tablet Take 10 mEq by mouth daily.     [name] torsemide (DEMADEX) 20 MG tablet Take 2 tablets (40 mg total) by mouth daily. New medicine to reduce fluid build up. 60 tablet 3  . moxifloxacin  (AVELOX) 400 MG tablet Take 1 tablet (400 mg total) by mouth daily. (Patient not taking: Reported on 06/03/2015) 14 tablet 0   No facility-administered medications prior to visit.    ROS Review of Systems  Objective:  BP 141/72 mmHg  Pulse 69  Temp(Src) 97 F (36.1 C) (Oral)  Ht '5\' 2"'  (1.575 m)  Wt 209 lb 9.6 oz (95.074 kg)  BMI 38.33 kg/m2  BP Readings from Last 3 Encounters:  06/03/15 141/72  05/31/15 137/70  05/14/15 148/72    Wt Readings from Last 3 Encounters:  06/03/15 209 lb 9.6 oz (95.074 kg)  05/31/15 211 lb (95.709 kg)  05/14/15 209 lb (94.802 kg)     Physical Exam  Lab Results  Component Value Date   HGBA1C 7.8* 03/08/2015   HGBA1C 7.6* 03/06/2015   HGBA1C 8.1 01/12/2015    Lab Results  Component Value Date   WBC 14.3* 05/31/2015   HGB 12.7 05/31/2015   HCT 40.8 05/31/2015   PLT 376 03/09/2015   GLUCOSE 162* 03/09/2015   CHOL 235* 01/12/2015   TRIG 265* 01/12/2015   HDL 45 01/12/2015   LDLCALC 137* 01/12/2015   ALT 34 03/05/2015   AST 38* 03/05/2015   NA 141 03/09/2015   K 4.0 03/09/2015   CL 104 03/09/2015   CREATININE 3.36* 03/09/2015   BUN 60* 03/09/2015   CO2 28 03/09/2015   TSH 2.261 03/05/2015   INR 1.0 06/17/2008   HGBA1C 7.8* 03/08/2015    No results found.  Assessment & Plan:   [name] was seen today for pneumonia.  Diagnoses and all orders for this visit:  RLL pneumonia Orders: -     BMP8+EGFR  CKD (chronic kidney disease), stage 4 (severe) Orders: -     BMP8+EGFR  Type II diabetes mellitus with nephropathy Orders: -     BMP8+EGFR  Other orders -     HYDROcodone-homatropine (HYCODAN) 5-1.5 MG/5ML syrup; Take 5 mLs by mouth every 8 (eight) hours as needed for cough.   I have discontinued Ms. Morreale's moxifloxacin. I am also having her start on HYDROcodone-homatropine. Additionally, I am having her maintain her aspirin, NIFEdipine, cholecalciferol, potassium chloride, atenolol, insulin lispro, Insulin Glargine,  CRESTOR, gabapentin, hydrALAZINE, pantoprazole, torsemide, albuterol, budesonide-formoterol, glucose blood, nitroGLYCERIN, and levofloxacin.  Meds ordered this encounter  Medications  . levofloxacin (LEVAQUIN) 500 MG tablet    Sig: Take 500 mg by mouth daily.  [name] HYDROcodone-homatropine (HYCODAN) 5-1.5 MG/5ML syrup    Sig: Take 5 mLs by mouth every 8 (eight) hours as needed for cough.    Dispense:  120 mL    Refill:  0   Pneumonia With significant improvement. Encouraged patient to continue her antibiotic.  Follow-up: Return in about 2 weeks (around 06/17/2015) for Needs CXR, F/U pneumonia with Dr. Sabra Heck.  [name], M.D.

## 2015-06-04 LAB — BMP8+EGFR
BUN/Creatinine Ratio: 10 — ABNORMAL LOW (ref 11–26)
BUN: 36 mg/dL — ABNORMAL HIGH (ref 8–27)
CO2: 23 mmol/L (ref 18–29)
Calcium: 8.6 mg/dL — ABNORMAL LOW (ref 8.7–10.3)
Chloride: 101 mmol/L (ref 97–108)
Creatinine, Ser: 3.45 mg/dL (ref 0.57–1.00)
GFR calc Af Amer: 16 mL/min/{1.73_m2} — ABNORMAL LOW (ref >59)
GFR calc non Af Amer: 14 mL/min/{1.73_m2} — ABNORMAL LOW (ref >59)
Glucose: 120 mg/dL — ABNORMAL HIGH (ref 65–99)
Potassium: 3.6 mmol/L (ref 3.5–5.2)
Sodium: 144 mmol/L (ref 134–144)

## 2015-06-11 ENCOUNTER — Other Ambulatory Visit: Payer: Self-pay

## 2015-06-11 DIAGNOSIS — Z01810 Encounter for preprocedural cardiovascular examination: Secondary | ICD-10-CM

## 2015-06-11 DIAGNOSIS — N184 Chronic kidney disease, stage 4 (severe): Secondary | ICD-10-CM

## 2015-06-21 ENCOUNTER — Encounter: Payer: Self-pay | Admitting: Family Medicine

## 2015-06-22 ENCOUNTER — Encounter: Payer: Self-pay | Admitting: Family Medicine

## 2015-06-22 ENCOUNTER — Ambulatory Visit (INDEPENDENT_AMBULATORY_CARE_PROVIDER_SITE_OTHER): Payer: Medicare Other | Admitting: Family Medicine

## 2015-06-22 ENCOUNTER — Ambulatory Visit (INDEPENDENT_AMBULATORY_CARE_PROVIDER_SITE_OTHER): Payer: Medicare Other

## 2015-06-22 VITALS — BP 162/73 | HR 63 | Temp 97.1°F | Ht 62.0 in | Wt 205.0 lb

## 2015-06-22 DIAGNOSIS — E1121 Type 2 diabetes mellitus with diabetic nephropathy: Secondary | ICD-10-CM | POA: Diagnosis not present

## 2015-06-22 DIAGNOSIS — R05 Cough: Secondary | ICD-10-CM | POA: Diagnosis not present

## 2015-06-22 DIAGNOSIS — J181 Lobar pneumonia, unspecified organism: Secondary | ICD-10-CM

## 2015-06-22 DIAGNOSIS — J189 Pneumonia, unspecified organism: Secondary | ICD-10-CM | POA: Diagnosis not present

## 2015-06-22 DIAGNOSIS — R059 Cough, unspecified: Secondary | ICD-10-CM

## 2015-06-22 LAB — POCT GLYCOSYLATED HEMOGLOBIN (HGB A1C): Hemoglobin A1C: 6.7

## 2015-06-22 NOTE — Patient Instructions (Signed)
Continue current medications. Continue good therapeutic lifestyle changes which include good diet and exercise. Fall precautions discussed with patient. If an FOBT was given today- please return it to our front desk. If you are over 63 years old - you may need Prevnar 13 or the adult Pneumonia vaccine.  Flu Shots are still available at our office. If you still haven't had one please call to set up a nurse visit to get one.   After your visit with us today you will receive a survey in the mail or online from [employer] regarding your care with us. Please take a moment to fill this out. Your feedback is very important to us as you can help us better understand your patient needs as well as improve your experience and satisfaction. WE CARE ABOUT YOU!!!   

## 2015-06-22 NOTE — Progress Notes (Signed)
Subjective:    Patient ID: [name], female    DOB: 12-Dec-1950, 64 y.o.   MRN: [id number]  HPI Pt here for follow up and management of chronic medical problems which includes diabetes. Today we will also follow up on her recent RLL pneumonia.  She has now finished her medication and feels well. Plan was to repeat chest x-ray for clearing.  We spent some time talking about her blood sugars she was having some low sugars on fasting check and has herself reduced the dose of Lantus sugars for the last week are generally between 101 120 fasting.      Patient Active Problem List   Diagnosis Date Noted  . CAP (community acquired pneumonia) 03/08/2015  . Pleural nodules 03/08/2015  . Bronchitis, chronic obstructive w acute bronchitis 03/06/2015  . Chronic kidney disease, stage IV (severe) 03/06/2015  . Morbid obesity 03/06/2015  . Pericardial effusion 03/06/2015  . Acute on chronic diastolic CHF (congestive heart failure) 03/05/2015  . PAD (peripheral artery disease) 04/21/2014  . Leukocytosis 02/02/2014  . Hypokalemia 09/30/2013  . Dyspnea 09/29/2013  . Abnormal chest x-ray 09/29/2013  . Acute diastolic CHF (congestive heart failure) 09/29/2013  . Community acquired pneumonia 09/29/2013  . Type 2 diabetes mellitus with hypoglycemia 01/02/2012  . CKD (chronic kidney disease) 01/02/2012  . Diverticulosis 03/29/2011  . Diabetic neuropathy 03/29/2011  . Esophageal stricture 03/29/2011  . Vitamin D deficiency 03/29/2011  . Degenerative joint disease of left shoulder 03/29/2011  . Neck pain 03/29/2011  . Type II diabetes mellitus with nephropathy 04/19/2009  . Hyperlipidemia 04/19/2009  . HYPERTENSION, BENIGN 04/19/2009  . Coronary atherosclerosis 04/19/2009  . BRONCHITIS 04/19/2009   Outpatient Encounter Prescriptions as of 06/22/2015  Medication Sig  . albuterol (PROVENTIL HFA;VENTOLIN HFA) 108 (90 BASE) MCG/ACT inhaler Inhale 2 puffs into the lungs every 6 (six) hours as  needed for wheezing or shortness of breath.  [name] aspirin 81 MG EC tablet Take 81 mg by mouth daily.    [name] atenolol (TENORMIN) 50 MG tablet TAKE (1) TABLET TWICE DAILY.  . budesonide-formoterol (SYMBICORT) 160-4.5 MCG/ACT inhaler Inhale 2 puffs into the lungs 2 (two) times daily as needed (shortness of breath).  . cholecalciferol (VITAMIN D) 1000 UNITS tablet Take 1,000 Units by mouth every morning.  [name] CRESTOR 20 MG tablet TAKE 1 TABLET IN THE EVENING.  [name] gabapentin (NEURONTIN) 300 MG capsule Take 1 capsule (300 mg total) by mouth at bedtime. DOSE DECREASED TO ONCE DAILY-AT BEDTIME BECAUSE OF YOUR KIDNEY FUNCTION  . glucose blood test strip Test 4 times daily  . hydrALAZINE (APRESOLINE) 50 MG tablet Take 1 tablet (50 mg total) by mouth 2 (two) times daily. New medicine for high blood pressure  . Insulin Glargine (LANTUS) 100 UNIT/ML Solostar Pen Inject 72 Units into the skin daily at 10 pm. (Patient taking differently: Inject 68 Units into the skin daily at 10 pm. )  . insulin lispro (HUMALOG) 100 UNIT/ML injection Inject 0.1-0.13 mLs (10-13 Units total) into the skin 3 (three) times daily with meals. To be administered as directed per sliding scale: 90-150= 10  Units 151-200=11 units 201-250=12 units 251-300= 13 units  To call MD if levels are between 251-300 for three consecutive days  . NIFEdipine (PROCARDIA XL/ADALAT-CC) 90 MG 24 hr tablet Take 90 mg by mouth every evening.   . nitroGLYCERIN (NITROSTAT) 0.4 MG SL tablet Place 1 tablet (0.4 mg total) under the tongue every 5 (five) minutes as needed for chest pain.  [name]  pantoprazole (PROTONIX) 40 MG tablet Take 1 tablet (40 mg total) by mouth daily. For gastric acid reflux.  . potassium chloride (K-DUR) 10 MEQ tablet Take 10 mEq by mouth daily.   [name] torsemide (DEMADEX) 20 MG tablet Take 2 tablets (40 mg total) by mouth daily. New medicine to reduce fluid build up.  . [DISCONTINUED] HYDROcodone-homatropine (HYCODAN) 5-1.5 MG/5ML syrup Take 5 mLs by  mouth every 8 (eight) hours as needed for cough.  . [DISCONTINUED] levofloxacin (LEVAQUIN) 500 MG tablet Take 500 mg by mouth daily.   No facility-administered encounter medications on file as of 06/22/2015.     Review of Systems  Constitutional: Negative.   HENT: Negative.   Eyes: Negative.   Respiratory: Negative.   Cardiovascular: Negative.   Gastrointestinal: Negative.   Endocrine: Negative.   Genitourinary: Negative.   Musculoskeletal: Negative.   Skin: Negative.   Allergic/Immunologic: Negative.   Neurological: Negative.   Hematological: Negative.   Psychiatric/Behavioral: Negative.        Objective:   Physical Exam  Constitutional: She is oriented to person, place, and time. She appears well-developed.  Cardiovascular: Normal rate and regular rhythm.   Pulmonary/Chest: Effort normal and breath sounds normal.  Neurological: She is alert and oriented to person, place, and time.  Psychiatric: She has a normal mood and affect.    BP 162/73 mmHg  Pulse 63  Temp(Src) 97.1 F (36.2 C) (Oral)  Ht 5\' 2"  (1.575 m)  Wt 205 lb (92.987 kg)  BMI 37.49 kg/m2       Assessment & Plan:  1. Cough As resolved as pneumonia is treated - DG Chest 2 View; Future  2. RLL pneumonia Chest x-ray shows pneumonia has resolved  3. Type II diabetes mellitus with nephropathy A1c is down from 7.8 to 6.7 and she was praised for this and told to keep up the good work - POCT glycosylated hemoglobin (Hb A1C)  Wardell Honour MD

## 2015-07-13 ENCOUNTER — Encounter: Payer: Self-pay | Admitting: Vascular Surgery

## 2015-07-14 ENCOUNTER — Other Ambulatory Visit: Payer: Self-pay

## 2015-07-14 ENCOUNTER — Ambulatory Visit (HOSPITAL_COMMUNITY)
Admission: RE | Admit: 2015-07-14 | Discharge: 2015-07-14 | Disposition: A | Discharge status: Home / Self Care | Payer: Medicare Other | Source: Ambulatory Visit | Attending: Vascular Surgery | Admitting: Vascular Surgery

## 2015-07-14 ENCOUNTER — Ambulatory Visit (INDEPENDENT_AMBULATORY_CARE_PROVIDER_SITE_OTHER): Payer: Medicare Other | Admitting: Vascular Surgery

## 2015-07-14 ENCOUNTER — Ambulatory Visit (INDEPENDENT_AMBULATORY_CARE_PROVIDER_SITE_OTHER)
Admission: RE | Admit: 2015-07-14 | Discharge: 2015-07-14 | Disposition: A | Discharge status: Home / Self Care | Payer: Medicare Other | Source: Ambulatory Visit | Attending: Vascular Surgery | Admitting: Vascular Surgery

## 2015-07-14 ENCOUNTER — Encounter: Payer: Self-pay | Admitting: Vascular Surgery

## 2015-07-14 VITALS — BP 160/62 | HR 66 | Ht 62.0 in | Wt 207.0 lb

## 2015-07-14 DIAGNOSIS — N186 End stage renal disease: Secondary | ICD-10-CM | POA: Diagnosis not present

## 2015-07-14 DIAGNOSIS — N184 Chronic kidney disease, stage 4 (severe): Secondary | ICD-10-CM

## 2015-07-14 DIAGNOSIS — Z01810 Encounter for preprocedural cardiovascular examination: Secondary | ICD-10-CM | POA: Diagnosis not present

## 2015-07-14 NOTE — Progress Notes (Signed)
[facility] [place]  Patient name: [name] MRN: [id number] DOB: Oct 30, 1951 Sex: female  REASON FOR CONSULT: evaluate for new hemodialysis access. Referred by Dr. Lowanda Foster   HPI: [name] is a 64 y.o. female who is not yet on dialysis. She is right-handed. She has end-stage renal disease secondary to diabetes and hypertension. He denies any recent uremic symptoms. Specifically she denies nausea, vomiting, fatigue, anorexia, or palpitations.  Of note, she is right handed, however, she needs to have surgery on her left shoulder which is bothering her significantly.   Past Medical History  Diagnosis Date  . Esophageal stricture   . Hyperlipidemia   . Hypertension   . Bronchitis   . CAD (coronary artery disease)     s/p Stenting in 2005;  [place] 7/09: Vigorous LV function, 2-3+ MR, RI 40%, RI stent patent, proximal-mid RCA 40-50%;  Echo 7/09:  EF 55-65%, trivial MR;  Myoview 11/18/12: EF 66%, normal LV wall motion, mild to moderate anterior ischemia - reviewed by [facility] and felt to rep breast atten (low risk);  Echo 6/14: mild LVH, EF 55-65%, Gr 2 DD, PASP 44, trivial eff   . Arthritis   . IDDM (insulin dependent diabetes mellitus)   . Diverticulosis   . Diabetic neuropathy   . Vitamin D deficiency   . Degenerative joint disease     left shoulder  . Chronic neck pain   . Headache(784.0)   . Anemia     hx of  . Stroke     "mini- years ago"  . Chronic kidney disease     CREATININE IS UP--GOING TO KIDNEY MD   . CHF (congestive heart failure)   . Pericardial effusion 03/06/2015    Very small per echo ; pleural nodules seen on CT chest.  . Acute on chronic diastolic CHF (congestive heart failure) 03/05/2015    Grade 2. EF 60-65%  . COPD (chronic obstructive pulmonary disease)   . Peripheral vascular disease    Family History  Problem Relation Age of Onset  . Heart disease Other   . Hypertension Father   . Hypertension Daughter    SOCIAL  HISTORY: Social History  Substance Use Topics  . Smoking status: Former Smoker -- 1.00 packs/day for 30 years    Quit date: 12/04/2001  . Smokeless tobacco: Never Used  . Alcohol Use: 3.6 oz/week    6 Cans of beer per week     Comment: occasional   Allergies  Allergen Reactions  . Ace Inhibitors Other (See Comments)    cough  . Clopidogrel Bisulfate     REACTION: Sick, Headache, "Felt terrible"  . Codeine Other (See Comments)    REACTION: hallucinations  . Lisinopril Other (See Comments)    cough  . Penicillins     unknown   Current Outpatient Prescriptions  Medication Sig Dispense Refill  . albuterol (PROVENTIL HFA;VENTOLIN HFA) 108 (90 BASE) MCG/ACT inhaler Inhale 2 puffs into the lungs every 6 (six) hours as needed for wheezing or shortness of breath. 1 Inhaler 2  . aspirin 81 MG EC tablet Take 81 mg by mouth daily.      [name] atenolol (TENORMIN) 50 MG tablet TAKE (1) TABLET TWICE DAILY. 60 tablet 5  . budesonide-formoterol (SYMBICORT) 160-4.5 MCG/ACT inhaler Inhale 2 puffs into the lungs 2 (two) times daily as needed (shortness of breath). 1 Inhaler 12  . cholecalciferol (VITAMIN D) 1000 UNITS tablet Take 1,000 Units by mouth every morning.    [name]  CRESTOR 20 MG tablet TAKE 1 TABLET IN THE EVENING. 30 tablet 2  . gabapentin (NEURONTIN) 300 MG capsule Take 1 capsule (300 mg total) by mouth at bedtime. DOSE DECREASED TO ONCE DAILY-AT BEDTIME BECAUSE OF YOUR KIDNEY FUNCTION    . hydrALAZINE (APRESOLINE) 50 MG tablet Take 1 tablet (50 mg total) by mouth 2 (two) times daily. New medicine for high blood pressure 60 tablet 3  . Insulin Glargine (LANTUS) 100 UNIT/ML Solostar Pen Inject 72 Units into the skin daily at 10 pm. (Patient taking differently: Inject 68 Units into the skin daily at 10 pm. ) 30 mL 5  . insulin lispro (HUMALOG) 100 UNIT/ML injection Inject 0.1-0.13 mLs (10-13 Units total) into the skin 3 (three) times daily with meals. To be administered as directed per sliding  scale: 90-150= 10  Units 151-200=11 units 201-250=12 units 251-300= 13 units  To call MD if levels are between 251-300 for three consecutive days 10 mL 1  . NIFEdipine (PROCARDIA XL/ADALAT-CC) 90 MG 24 hr tablet Take 90 mg by mouth every evening.     . nitroGLYCERIN (NITROSTAT) 0.4 MG SL tablet Place 1 tablet (0.4 mg total) under the tongue every 5 (five) minutes as needed for chest pain. 25 tablet 5  . pantoprazole (PROTONIX) 40 MG tablet Take 1 tablet (40 mg total) by mouth daily. For gastric acid reflux. 30 tablet 3  . potassium chloride (K-DUR) 10 MEQ tablet Take 10 mEq by mouth every other day.     . torsemide (DEMADEX) 20 MG tablet Take 2 tablets (40 mg total) by mouth daily. New medicine to reduce fluid build up. 60 tablet 3  . glucose blood test strip Test 4 times daily (Patient not taking: Reported on 07/14/2015) 100 each 2   No current facility-administered medications for this visit.   REVIEW OF SYSTEMS: Valu.Nieves ] denotes positive finding; [  ] denotes negative finding  CARDIOVASCULAR:  [ ]  chest pain   Valu.Nieves ] chest pressure   [ ]  palpitations   Valu.Nieves ] orthopnea   Valu.Nieves ] dyspnea on exertion   [ ]  claudication   [ ]  rest pain   [ ]  DVT   [ ]  phlebitis PULMONARY:   [ ]  productive cough   [ ]  asthma   Valu.Nieves ] wheezing NEUROLOGIC:   Valu.Nieves ] weakness  [ ]  paresthesias  [ ]  aphasia  [ ]  amaurosis  [ ]  dizziness HEMATOLOGIC:   [ ]  bleeding problems   [ ]  clotting disorders MUSCULOSKELETAL:  [ ]  joint pain   [ ]  joint swelling [ ]  leg swelling GASTROINTESTINAL: [ ]   blood in stool  [ ]   hematemesis GENITOURINARY:  [ ]   dysuria  [ ]   hematuria PSYCHIATRIC:  [ ]  history of major depression INTEGUMENTARY:  [ ]  rashes  [ ]  ulcers CONSTITUTIONAL:  [ ]  fever   [ ]  chills  PHYSICAL EXAM: Filed Vitals:   07/14/15 1315 07/14/15 1319  BP: 166/87 160/62  Pulse: 66   Height: 5\' 2"  (1.575 m)   Weight: 207 lb (93.895 kg)   SpO2: 97%    GENERAL: The patient is a well-nourished female, in no acute distress.  The vital signs are documented above. CARDIAC: There is a regular rate and rhythm.  VASCULAR: I do not do Tech carotid bruits. She has palpable brachial pulses bilaterally. PULMONARY: There is good air exchange bilaterally without wheezing or rales. ABDOMEN: Soft and non-tender with normal pitched bowel sounds.  MUSCULOSKELETAL: There are  no major deformities or cyanosis. NEUROLOGIC: No focal weakness or paresthesias are detected. SKIN: There are no ulcers or rashes noted. PSYCHIATRIC: The patient has a normal affect.  DATA:  I have independently interpreted her upper extremity vein mapping. On the right side, the forearm cephalic vein and upper arm cephalic vein look reasonable in size. Likewise the basilic vein looks reasonable in size although it is fairly short. On the left side the forearm and upper arm cephalic vein look reasonable in size. The basilic vein appears reasonable in size although it is fairly short.  I have independently interpreted her upper extremity arterial Doppler study which shows triphasic Doppler signals in the radial and ulnar positions bilaterally.  MEDICAL ISSUES:  STAGE IV CHRONIC KIDNEY DISEASE: Based on her vein map, she appears to be a reasonable candidate for a fistula. Given that she will need surgery on her left shoulder I am hesitant to place access in her left arm as this could result in significant dilation of her upper extremity veins which could potentially complicate to any surgery on her left shoulder. She is agreeable to place access in her right arm. I have explained the indications for placement of an AV fistula or AV graft. I've explained that if at all possible we will place an AV fistula.  I have reviewed the risks of placement of an AV fistula including but not limited to: failure of the fistula to mature, need for subsequent interventions, and thrombosis. In addition I have reviewed the potential complications of placement of an AV graft. These  risks include, but are not limited to, graft thrombosis, graft infection, wound healing problems, bleeding, arm swelling, and steal syndrome. All the patient's questions were answered and they are agreeable to proceed with surgery. Her surgery is scheduled for 07/20/2015.   Deitra Mayo [facility] [place]: [phone]

## 2015-07-19 ENCOUNTER — Encounter (HOSPITAL_COMMUNITY): Payer: Self-pay | Admitting: *Deleted

## 2015-07-19 MED ORDER — VANCOMYCIN HCL IN DEXTROSE 1-5 GM/200ML-% IV SOLN
1000.0000 mg | INTRAVENOUS | Status: AC
  Administered 2015-07-20: 1000 mg via INTRAVENOUS
  Filled 2015-07-19: qty 200

## 2015-07-19 MED ORDER — SODIUM CHLORIDE 0.9 % IV SOLN
INTRAVENOUS | Status: DC
  Administered 2015-07-20: 07:00:00 via INTRAVENOUS

## 2015-07-19 NOTE — Progress Notes (Signed)
Ebony Hail, [place], anesthesia asked to review pt chart due to significant cardiac history.

## 2015-07-19 NOTE — Progress Notes (Signed)
Anesthesia Chart Review: SAME DAY WORK-UP.  Patient is a 64 year old female scheduled for right AVF creation on 07/20/15 by Dr. Scot Dock.  History includes stage IV CKD not yet on hemodialysis, CAD s/p ramus stent and PTCA OM3 '03 stent '05, CHF, chronic diastolic CHF, PVD, COPD, former smoker, HTN, IDDM with neuropathy, obesity, HLD, anemia, esophageal stricture, hysterectomy, c-spine surgery, bilateral cataract extraction '15, lumbar laminectomy '13. She was last hospitalized on 03/09/15 for acute on chronic diastolic CHF in the setting of worsening CKD. She was diuresed 9.2 L. Troponins were negative. She was treated for RLL PNA 05/31/15 by her PCP with [facility]. Nephrologist is Dr. Lowanda Foster. Pulmonologist is Dr. Luan Pulling.   Cardiologist is Dr. Percival Spanish, last visit 05/14/15. He reivewed records from her CHF admission. His note indicates that he was aware that she was going to have a fistula placed for dialysis access. His note also indicates that she occasionally gets chest discomfort, but unchanged from previous. He states, "The patient has no new sypmtoms since stress testing 2013. No further cardiovascular testing is indicated. We will continue with aggressive risk reduction and meds as listed." He is planning for her to have a 6 month follow-up chest CT to evaluate for pleural nodules and right lung opacity seen on 03/07/15. Our PAT RN called and spoke with patient today. Patient denied recent Nitro use. Reportedly she said her last SOB/CP episode was during hospitalization for acute diastolic CHF in [id number].   Meds include albuterol, ASA 81 mg, atenolol, Symbicort, clacitriol, Crestor, Neurontin, hydralazine, Lantus, Humalog, Nitro, nifedipine, Protonix, KCl, torsemide.  03/05/15 EKG: SR, anterolateral infarct (age undetermined), non-specific lateral T wave abnormality.  She has had findings of anterolateral infarct on prior EKGs.   03/05/15 Echo: Study Conclusions - Left ventricle: The  cavity size was normal. There was mild concentric hypertrophy. Systolic function was normal. The estimated ejection fraction was in the range of 60% to 65%. Wall motion was normal; there were no regional wall motion abnormalities. Features are consistent with a pseudonormal left ventricular filling pattern, with concomitant abnormal relaxation and increased filling pressure (grade 2 diastolic dysfunction). Doppler parameters are consistent with high ventricular filling pressure. - Mitral valve: Mildly thickened leaflets . There was mild regurgitation. - Left atrium: The atrium was moderately dilated. Volume/bsa, ES, (1-plane Simpson&'s, A2C): 37.8 ml/m^2. - Right atrium: The atrium was mildly dilated. - Pericardium, extracardiac: A very small pericardial effusion was seen. No evidence of tamponade physiology.  11/18/12 Nuclear stress test: Overall Impression: Intermediate risk stress nuclear study with a medium size, medium intensity, reversible anterior defect consistent with mild to moderate anterior ischemia. LV Ejection Fraction: 66%. LV Wall Motion: NL LV Function; NL Wall Motion. Results were reviewed by Dr. Percival Spanish and according to 01/07/13 notation, "I have reviewed the patients stress test from Dec. We compared this with the 2012. There is hypoperfusion of the anterior wall. However, this was felt to be breast attenuation in 2012 and it looks unchanged on the current scan. Therefore, based on ACC/AHA guidelines, the patient would be at acceptable risk for the planned procedure without further cardiovascular testing.I have reviewed the patients stress test from Dec. We compared this with the 2012. There is hypoperfusion of the anterior wall. However, this was felt to be breast attenuation in 2012 and it looks unchanged on the current scan. Therefore, based on ACC/AHA guidelines, the patient would be at acceptable risk for the planned procedure without further cardiovascular testing." This  stress test was  done as part of a clearance for left rotator cuff surgery.  06/17/08 LHC/RHC (done for recurrent chest pain): Vigorous LV function, 2-3+ MR, RI 40%, RI stent patent, proximal-mid RCA 40-50%, luminal irregularities LAD and CX (Echo 7/09: EF 55-65%, trivial MR).   03/17/15 PFTs: FVC 2.02 (85%), FEV1 1.70 (92%), DLCOunc 13.62 (63%). No significant change with BD. Findings felt consistent with COPD.  06/22/15 CXR: IMPRESSION: Cardiac enlargement. Interval clearing of right lower lobe pneumonia.  She is for labs on arrival.   Patient with known CAD with recent CHF admission 03/2015 in the setting of worsening CKD. She has since seen Dr. Percival Spanish. He felt she had no new symptoms. She notified him of surgery plans. No new cardiac testing ordered at that appointment. Patient denied any new symptoms during her phone RN interview today. Discussed with anesthesiologist Dr. Orene Desanctis. Patient will be further evaluated on the day of surgery. If labs are acceptable and no new CV symptoms then she can likely proceed as planned.  George Hugh [facility] Short Stay Center/Anesthesiology Phone [phone] 07/19/2015 3:48 PM

## 2015-07-19 NOTE — Progress Notes (Signed)
Pt denies SOB and chest pain but is under the care of Dr. Percival Spanish, cardiology. Pt stated that she was instructed by Dr. Nicole Cella office to take half of her night dose of insulin with a high protein snack. Pt made aware to stop taking otc vitamins, herbal medications and NSAIDs. Pt verbalized understanding of all pre-op instructions.

## 2015-07-20 ENCOUNTER — Encounter: Payer: Self-pay | Admitting: Nephrology

## 2015-07-20 ENCOUNTER — Ambulatory Visit (HOSPITAL_COMMUNITY): Payer: Medicare Other | Admitting: Vascular Surgery

## 2015-07-20 ENCOUNTER — Encounter (HOSPITAL_COMMUNITY): Payer: Self-pay

## 2015-07-20 ENCOUNTER — Other Ambulatory Visit: Payer: Self-pay | Admitting: *Deleted

## 2015-07-20 ENCOUNTER — Ambulatory Visit (HOSPITAL_COMMUNITY)
Admission: RE | Admit: 2015-07-20 | Discharge: 2015-07-20 | Disposition: A | Discharge status: Home / Self Care | Payer: Medicare Other | Source: Ambulatory Visit | Attending: Vascular Surgery | Admitting: Vascular Surgery

## 2015-07-20 ENCOUNTER — Encounter (HOSPITAL_COMMUNITY)
Admission: RE | Disposition: A | Discharge status: Home / Self Care | Payer: Self-pay | Source: Ambulatory Visit | Attending: Vascular Surgery

## 2015-07-20 DIAGNOSIS — Z6836 Body mass index (BMI) 36.0-36.9, adult: Secondary | ICD-10-CM | POA: Insufficient documentation

## 2015-07-20 DIAGNOSIS — Z87891 Personal history of nicotine dependence: Secondary | ICD-10-CM | POA: Diagnosis not present

## 2015-07-20 DIAGNOSIS — E1122 Type 2 diabetes mellitus with diabetic chronic kidney disease: Secondary | ICD-10-CM | POA: Diagnosis not present

## 2015-07-20 DIAGNOSIS — Z4931 Encounter for adequacy testing for hemodialysis: Secondary | ICD-10-CM

## 2015-07-20 DIAGNOSIS — J449 Chronic obstructive pulmonary disease, unspecified: Secondary | ICD-10-CM | POA: Insufficient documentation

## 2015-07-20 DIAGNOSIS — Z7982 Long term (current) use of aspirin: Secondary | ICD-10-CM | POA: Diagnosis not present

## 2015-07-20 DIAGNOSIS — N184 Chronic kidney disease, stage 4 (severe): Secondary | ICD-10-CM | POA: Insufficient documentation

## 2015-07-20 DIAGNOSIS — E559 Vitamin D deficiency, unspecified: Secondary | ICD-10-CM | POA: Diagnosis not present

## 2015-07-20 DIAGNOSIS — Z8673 Personal history of transient ischemic attack (TIA), and cerebral infarction without residual deficits: Secondary | ICD-10-CM | POA: Insufficient documentation

## 2015-07-20 DIAGNOSIS — M19012 Primary osteoarthritis, left shoulder: Secondary | ICD-10-CM | POA: Insufficient documentation

## 2015-07-20 DIAGNOSIS — I2510 Atherosclerotic heart disease of native coronary artery without angina pectoris: Secondary | ICD-10-CM | POA: Diagnosis not present

## 2015-07-20 DIAGNOSIS — Z7951 Long term (current) use of inhaled steroids: Secondary | ICD-10-CM | POA: Diagnosis not present

## 2015-07-20 DIAGNOSIS — E785 Hyperlipidemia, unspecified: Secondary | ICD-10-CM | POA: Diagnosis not present

## 2015-07-20 DIAGNOSIS — E1140 Type 2 diabetes mellitus with diabetic neuropathy, unspecified: Secondary | ICD-10-CM | POA: Insufficient documentation

## 2015-07-20 DIAGNOSIS — Z79899 Other long term (current) drug therapy: Secondary | ICD-10-CM | POA: Diagnosis not present

## 2015-07-20 DIAGNOSIS — I5032 Chronic diastolic (congestive) heart failure: Secondary | ICD-10-CM | POA: Insufficient documentation

## 2015-07-20 DIAGNOSIS — E1151 Type 2 diabetes mellitus with diabetic peripheral angiopathy without gangrene: Secondary | ICD-10-CM | POA: Insufficient documentation

## 2015-07-20 DIAGNOSIS — I129 Hypertensive chronic kidney disease with stage 1 through stage 4 chronic kidney disease, or unspecified chronic kidney disease: Secondary | ICD-10-CM | POA: Insufficient documentation

## 2015-07-20 DIAGNOSIS — Z794 Long term (current) use of insulin: Secondary | ICD-10-CM | POA: Diagnosis not present

## 2015-07-20 DIAGNOSIS — N186 End stage renal disease: Secondary | ICD-10-CM

## 2015-07-20 DIAGNOSIS — D649 Anemia, unspecified: Secondary | ICD-10-CM | POA: Insufficient documentation

## 2015-07-20 HISTORY — PX: AV FISTULA PLACEMENT: SHX1204

## 2015-07-20 LAB — POCT I-STAT 4, (NA,K, GLUC, HGB,HCT)
Glucose, Bld: 114 mg/dL — ABNORMAL HIGH (ref 65–99)
HCT: 35 % — ABNORMAL LOW (ref 36.0–46.0)
Hemoglobin: 11.9 g/dL — ABNORMAL LOW (ref 12.0–15.0)
Potassium: 3.3 mmol/L — ABNORMAL LOW (ref 3.5–5.1)
Sodium: 142 mmol/L (ref 135–145)

## 2015-07-20 LAB — GLUCOSE, CAPILLARY: Glucose-Capillary: 129 mg/dL — ABNORMAL HIGH (ref 65–99)

## 2015-07-20 SURGERY — ARTERIOVENOUS (AV) FISTULA CREATION
Anesthesia: Monitor Anesthesia Care | Site: Arm Lower | Laterality: Right

## 2015-07-20 MED ORDER — LIDOCAINE HCL (PF) 1 % IJ SOLN
INTRAMUSCULAR | Status: AC
  Filled 2015-07-20: qty 30

## 2015-07-20 MED ORDER — 0.9 % SODIUM CHLORIDE (POUR BTL) OPTIME
TOPICAL | Status: DC | PRN
  Administered 2015-07-20: 1000 mL

## 2015-07-20 MED ORDER — ONDANSETRON HCL 4 MG/2ML IJ SOLN
INTRAMUSCULAR | Status: AC
  Filled 2015-07-20: qty 2

## 2015-07-20 MED ORDER — PROPOFOL INFUSION 10 MG/ML OPTIME
INTRAVENOUS | Status: DC | PRN
Start: 2015-07-20 — End: 2015-07-20
  Administered 2015-07-20: 25 ug/kg/min via INTRAVENOUS

## 2015-07-20 MED ORDER — PROTAMINE SULFATE 10 MG/ML IV SOLN
INTRAVENOUS | Status: AC
  Filled 2015-07-20: qty 5

## 2015-07-20 MED ORDER — SODIUM CHLORIDE 0.9 % IR SOLN
Status: DC | PRN
  Administered 2015-07-20: 10:00:00

## 2015-07-20 MED ORDER — CHLORHEXIDINE GLUCONATE CLOTH 2 % EX PADS: 6.0000 | MEDICATED_PAD | Freq: Once | CUTANEOUS | Status: DC

## 2015-07-20 MED ORDER — DEXTROSE 5 % IV SOLN
INTRAVENOUS | Status: DC | PRN
  Administered 2015-07-20: 09:00:00 via INTRAVENOUS

## 2015-07-20 MED ORDER — PROTAMINE SULFATE 10 MG/ML IV SOLN
INTRAVENOUS | Status: DC | PRN
  Administered 2015-07-20 (×3): 10 mg via INTRAVENOUS

## 2015-07-20 MED ORDER — FENTANYL CITRATE (PF) 100 MCG/2ML IJ SOLN
25.0000 ug | INTRAMUSCULAR | Status: DC | PRN
  Administered 2015-07-20: 50 ug via INTRAVENOUS

## 2015-07-20 MED ORDER — HEPARIN SODIUM (PORCINE) 1000 UNIT/ML IJ SOLN
INTRAMUSCULAR | Status: AC
  Filled 2015-07-20: qty 1

## 2015-07-20 MED ORDER — THROMBIN 20000 UNITS EX SOLR
CUTANEOUS | Status: AC
  Filled 2015-07-20: qty 20000

## 2015-07-20 MED ORDER — LIDOCAINE-EPINEPHRINE (PF) 1 %-1:200000 IJ SOLN
INTRAMUSCULAR | Status: AC
  Filled 2015-07-20: qty 30

## 2015-07-20 MED ORDER — MIDAZOLAM HCL 5 MG/5ML IJ SOLN
INTRAMUSCULAR | Status: DC | PRN
  Administered 2015-07-20: 1 mg via INTRAVENOUS
  Administered 2015-07-20 (×2): 0.5 mg via INTRAVENOUS

## 2015-07-20 MED ORDER — HEPARIN SODIUM (PORCINE) 1000 UNIT/ML IJ SOLN
INTRAMUSCULAR | Status: DC | PRN
  Administered 2015-07-20: 7000 [IU] via INTRAVENOUS

## 2015-07-20 MED ORDER — OXYCODONE-ACETAMINOPHEN 5-325 MG PO TABS: 1.0000 | ORAL_TABLET | Freq: Four times a day (QID) | ORAL | Status: DC | PRN

## 2015-07-20 MED ORDER — PROPOFOL 10 MG/ML IV BOLUS
INTRAVENOUS | Status: AC
  Filled 2015-07-20: qty 20

## 2015-07-20 MED ORDER — FENTANYL CITRATE (PF) 100 MCG/2ML IJ SOLN
INTRAMUSCULAR | Status: AC
  Filled 2015-07-20: qty 2

## 2015-07-20 MED ORDER — LIDOCAINE HCL (PF) 1 % IJ SOLN
INTRAMUSCULAR | Status: DC | PRN
  Administered 2015-07-20: 12 mL

## 2015-07-20 MED ORDER — MIDAZOLAM HCL 2 MG/2ML IJ SOLN
INTRAMUSCULAR | Status: AC
  Filled 2015-07-20: qty 4

## 2015-07-20 MED ORDER — FENTANYL CITRATE (PF) 250 MCG/5ML IJ SOLN
INTRAMUSCULAR | Status: AC
  Filled 2015-07-20: qty 5

## 2015-07-20 MED ORDER — ONDANSETRON HCL 4 MG/2ML IJ SOLN
INTRAMUSCULAR | Status: DC | PRN
  Administered 2015-07-20: 4 mg via INTRAVENOUS

## 2015-07-20 MED ORDER — FENTANYL CITRATE (PF) 100 MCG/2ML IJ SOLN
INTRAMUSCULAR | Status: DC | PRN
  Administered 2015-07-20 (×4): 25 ug via INTRAVENOUS

## 2015-07-20 SURGICAL SUPPLY — 35 items
ARMBAND PINK RESTRICT EXTREMIT (MISCELLANEOUS) ×2 IMPLANT
CANISTER SUCTION 2500CC (MISCELLANEOUS) ×2 IMPLANT
CANNULA VESSEL 3MM 2 BLNT TIP (CANNULA) ×2 IMPLANT
CLIP TI MEDIUM 6 (CLIP) ×2 IMPLANT
CLIP TI WIDE RED SMALL 6 (CLIP) ×4 IMPLANT
COVER PROBE W GEL 5X96 (DRAPES) ×1 IMPLANT
DECANTER SPIKE VIAL GLASS SM (MISCELLANEOUS) ×1 IMPLANT
ELECT REM PT RETURN 9FT ADLT (ELECTROSURGICAL) ×2
ELECTRODE REM PT RTRN 9FT ADLT (ELECTROSURGICAL) ×1 IMPLANT
GLOVE BIO SURGEON STRL SZ 6.5 (GLOVE) ×1 IMPLANT
GLOVE BIO SURGEON STRL SZ7.5 (GLOVE) ×2 IMPLANT
GLOVE BIOGEL PI IND STRL 6.5 (GLOVE) IMPLANT
GLOVE BIOGEL PI IND STRL 7.5 (GLOVE) IMPLANT
GLOVE BIOGEL PI IND STRL 8 (GLOVE) ×1 IMPLANT
GLOVE BIOGEL PI INDICATOR 6.5 (GLOVE) ×5
GLOVE BIOGEL PI INDICATOR 7.5 (GLOVE) ×1
GLOVE BIOGEL PI INDICATOR 8 (GLOVE) ×1
GLOVE ECLIPSE 6.5 STRL STRAW (GLOVE) ×1 IMPLANT
GLOVE SS BIOGEL STRL SZ 7 (GLOVE) IMPLANT
GLOVE SUPERSENSE BIOGEL SZ 7 (GLOVE) ×1
GOWN STRL REUS W/ TWL LRG LVL3 (GOWN DISPOSABLE) ×3 IMPLANT
GOWN STRL REUS W/TWL LRG LVL3 (GOWN DISPOSABLE) ×10
KIT BASIN OR (CUSTOM PROCEDURE TRAY) ×2 IMPLANT
KIT ROOM TURNOVER OR (KITS) ×2 IMPLANT
LIQUID BAND (GAUZE/BANDAGES/DRESSINGS) ×3 IMPLANT
NS IRRIG 1000ML POUR BTL (IV SOLUTION) ×2 IMPLANT
PACK CV ACCESS (CUSTOM PROCEDURE TRAY) ×2 IMPLANT
PAD ARMBOARD 7.5X6 YLW CONV (MISCELLANEOUS) ×4 IMPLANT
SPONGE SURGIFOAM ABS GEL 100 (HEMOSTASIS) IMPLANT
SUT PROLENE 6 0 BV (SUTURE) ×2 IMPLANT
SUT VIC AB 3-0 SH 27 (SUTURE) ×4
SUT VIC AB 3-0 SH 27X BRD (SUTURE) ×1 IMPLANT
SUT VICRYL 4-0 PS2 18IN ABS (SUTURE) ×3 IMPLANT
UNDERPAD 30X30 INCONTINENT (UNDERPADS AND DIAPERS) ×2 IMPLANT
WATER STERILE IRR 1000ML POUR (IV SOLUTION) ×1 IMPLANT

## 2015-07-20 NOTE — Anesthesia Postprocedure Evaluation (Signed)
  Anesthesia Post-op Note  Patient: [name]  Procedure(s) Performed: Procedure(s): RADIAL CEPHALIC ARTERIOVENOUS FISTULA CREATION RIGHT ARM (Right)  Patient Location: PACU  Anesthesia Type:MAC  Level of Consciousness: awake and alert   Airway and Oxygen Therapy: Patient Spontanous Breathing  Post-op Pain: mild  Post-op Assessment: Post-op Vital signs reviewed              Post-op Vital Signs: stable  Last Vitals:  Filed Vitals:   07/20/15 1115  BP: 157/72  Pulse: 59  Temp:   Resp: 18    Complications: No apparent anesthesia complications

## 2015-07-20 NOTE — H&P (View-Only) (Signed)
[facility]ist of [place]  Patient name: [name] MRN: [id number] DOB: 1951-10-03 Sex: female  REASON FOR CONSULT: evaluate for new hemodialysis access. Referred by Dr. Lowanda Foster   HPI: [name] is a 64 y.o. female who is not yet on dialysis. She is right-handed. She has end-stage renal disease secondary to diabetes and hypertension. He denies any recent uremic symptoms. Specifically she denies nausea, vomiting, fatigue, anorexia, or palpitations.  Of note, she is right handed, however, she needs to have surgery on her left shoulder which is bothering her significantly.   Past Medical History  Diagnosis Date  . Esophageal stricture   . Hyperlipidemia   . Hypertension   . Bronchitis   . CAD (coronary artery disease)     s/p Stenting in 2005;  [place] 7/09: Vigorous LV function, 2-3+ MR, RI 40%, RI stent patent, proximal-mid RCA 40-50%;  Echo 7/09:  EF 55-65%, trivial MR;  Myoview 11/18/12: EF 66%, normal LV wall motion, mild to moderate anterior ischemia - reviewed by [facility] and felt to rep breast atten (low risk);  Echo 6/14: mild LVH, EF 55-65%, Gr 2 DD, PASP 44, trivial eff   . Arthritis   . IDDM (insulin dependent diabetes mellitus)   . Diverticulosis   . Diabetic neuropathy   . Vitamin D deficiency   . Degenerative joint disease     left shoulder  . Chronic neck pain   . Headache(784.0)   . Anemia     hx of  . Stroke     "mini- years ago"  . Chronic kidney disease     CREATININE IS UP--GOING TO KIDNEY MD   . CHF (congestive heart failure)   . Pericardial effusion 03/06/2015    Very small per echo ; pleural nodules seen on CT chest.  . Acute on chronic diastolic CHF (congestive heart failure) 03/05/2015    Grade 2. EF 60-65%  . COPD (chronic obstructive pulmonary disease)   . Peripheral vascular disease    Family History  Problem Relation Age of Onset  . Heart disease Other   . Hypertension Father   . Hypertension Daughter    SOCIAL  HISTORY: Social History  Substance Use Topics  . Smoking status: Former Smoker -- 1.00 packs/day for 30 years    Quit date: 12/04/2001  . Smokeless tobacco: Never Used  . Alcohol Use: 3.6 oz/week    6 Cans of beer per week     Comment: occasional   Allergies  Allergen Reactions  . Ace Inhibitors Other (See Comments)    cough  . Clopidogrel Bisulfate     REACTION: Sick, Headache, "Felt terrible"  . Codeine Other (See Comments)    REACTION: hallucinations  . Lisinopril Other (See Comments)    cough  . Penicillins     unknown   Current Outpatient Prescriptions  Medication Sig Dispense Refill  . albuterol (PROVENTIL HFA;VENTOLIN HFA) 108 (90 BASE) MCG/ACT inhaler Inhale 2 puffs into the lungs every 6 (six) hours as needed for wheezing or shortness of breath. 1 Inhaler 2  . aspirin 81 MG EC tablet Take 81 mg by mouth daily.      [name] atenolol (TENORMIN) 50 MG tablet TAKE (1) TABLET TWICE DAILY. 60 tablet 5  . budesonide-formoterol (SYMBICORT) 160-4.5 MCG/ACT inhaler Inhale 2 puffs into the lungs 2 (two) times daily as needed (shortness of breath). 1 Inhaler 12  . cholecalciferol (VITAMIN D) 1000 UNITS tablet Take 1,000 Units by mouth every morning.    [name]  CRESTOR 20 MG tablet TAKE 1 TABLET IN THE EVENING. 30 tablet 2  . gabapentin (NEURONTIN) 300 MG capsule Take 1 capsule (300 mg total) by mouth at bedtime. DOSE DECREASED TO ONCE DAILY-AT BEDTIME BECAUSE OF YOUR KIDNEY FUNCTION    . hydrALAZINE (APRESOLINE) 50 MG tablet Take 1 tablet (50 mg total) by mouth 2 (two) times daily. New medicine for high blood pressure 60 tablet 3  . Insulin Glargine (LANTUS) 100 UNIT/ML Solostar Pen Inject 72 Units into the skin daily at 10 pm. (Patient taking differently: Inject 68 Units into the skin daily at 10 pm. ) 30 mL 5  . insulin lispro (HUMALOG) 100 UNIT/ML injection Inject 0.1-0.13 mLs (10-13 Units total) into the skin 3 (three) times daily with meals. To be administered as directed per sliding  scale: 90-150= 10  Units 151-200=11 units 201-250=12 units 251-300= 13 units  To call MD if levels are between 251-300 for three consecutive days 10 mL 1  . NIFEdipine (PROCARDIA XL/ADALAT-CC) 90 MG 24 hr tablet Take 90 mg by mouth every evening.     . nitroGLYCERIN (NITROSTAT) 0.4 MG SL tablet Place 1 tablet (0.4 mg total) under the tongue every 5 (five) minutes as needed for chest pain. 25 tablet 5  . pantoprazole (PROTONIX) 40 MG tablet Take 1 tablet (40 mg total) by mouth daily. For gastric acid reflux. 30 tablet 3  . potassium chloride (K-DUR) 10 MEQ tablet Take 10 mEq by mouth every other day.     . torsemide (DEMADEX) 20 MG tablet Take 2 tablets (40 mg total) by mouth daily. New medicine to reduce fluid build up. 60 tablet 3  . glucose blood test strip Test 4 times daily (Patient not taking: Reported on 07/14/2015) 100 each 2   No current facility-administered medications for this visit.   REVIEW OF SYSTEMS: Valu.Nieves ] denotes positive finding; [  ] denotes negative finding  CARDIOVASCULAR:  [ ]  chest pain   Valu.Nieves ] chest pressure   [ ]  palpitations   Valu.Nieves ] orthopnea   Valu.Nieves ] dyspnea on exertion   [ ]  claudication   [ ]  rest pain   [ ]  DVT   [ ]  phlebitis PULMONARY:   [ ]  productive cough   [ ]  asthma   Valu.Nieves ] wheezing NEUROLOGIC:   Valu.Nieves ] weakness  [ ]  paresthesias  [ ]  aphasia  [ ]  amaurosis  [ ]  dizziness HEMATOLOGIC:   [ ]  bleeding problems   [ ]  clotting disorders MUSCULOSKELETAL:  [ ]  joint pain   [ ]  joint swelling [ ]  leg swelling GASTROINTESTINAL: [ ]   blood in stool  [ ]   hematemesis GENITOURINARY:  [ ]   dysuria  [ ]   hematuria PSYCHIATRIC:  [ ]  history of major depression INTEGUMENTARY:  [ ]  rashes  [ ]  ulcers CONSTITUTIONAL:  [ ]  fever   [ ]  chills  PHYSICAL EXAM: Filed Vitals:   07/14/15 1315 07/14/15 1319  BP: 166/87 160/62  Pulse: 66   Height: 5\' 2"  (1.575 m)   Weight: 207 lb (93.895 kg)   SpO2: 97%    GENERAL: The patient is a well-nourished female, in no acute distress.  The vital signs are documented above. CARDIAC: There is a regular rate and rhythm.  VASCULAR: I do not do Tech carotid bruits. She has palpable brachial pulses bilaterally. PULMONARY: There is good air exchange bilaterally without wheezing or rales. ABDOMEN: Soft and non-tender with normal pitched bowel sounds.  MUSCULOSKELETAL: There are  no major deformities or cyanosis. NEUROLOGIC: No focal weakness or paresthesias are detected. SKIN: There are no ulcers or rashes noted. PSYCHIATRIC: The patient has a normal affect.  DATA:  I have independently interpreted her upper extremity vein mapping. On the right side, the forearm cephalic vein and upper arm cephalic vein look reasonable in size. Likewise the basilic vein looks reasonable in size although it is fairly short. On the left side the forearm and upper arm cephalic vein look reasonable in size. The basilic vein appears reasonable in size although it is fairly short.  I have independently interpreted her upper extremity arterial Doppler study which shows triphasic Doppler signals in the radial and ulnar positions bilaterally.  MEDICAL ISSUES:  STAGE IV CHRONIC KIDNEY DISEASE: Based on her vein map, she appears to be a reasonable candidate for a fistula. Given that she will need surgery on her left shoulder I am hesitant to place access in her left arm as this could result in significant dilation of her upper extremity veins which could potentially complicate to any surgery on her left shoulder. She is agreeable to place access in her right arm. I have explained the indications for placement of an AV fistula or AV graft. I've explained that if at all possible we will place an AV fistula.  I have reviewed the risks of placement of an AV fistula including but not limited to: failure of the fistula to mature, need for subsequent interventions, and thrombosis. In addition I have reviewed the potential complications of placement of an AV graft. These  risks include, but are not limited to, graft thrombosis, graft infection, wound healing problems, bleeding, arm swelling, and steal syndrome. All the patient's questions were answered and they are agreeable to proceed with surgery. Her surgery is scheduled for 07/20/2015.   Deitra Mayo [facility] [place]: [phone]

## 2015-07-20 NOTE — Interval H&P Note (Signed)
History and Physical Interval Note:  07/20/2015 8:38 AM  [name]  has presented today for surgery, with the diagnosis of Stage IV Chronic Kidney Disease N18.4  The various methods of treatment have been discussed with the patient and family. After consideration of risks, benefits and other options for treatment, the patient has consented to  Procedure(s): ARTERIOVENOUS (AV) FISTULA CREATION (Right) as a surgical intervention .  [name] history has been reviewed, patient examined, no change in status, stable for surgery.  I have reviewed the patient's chart and labs.  Questions were answered to the patient's satisfaction.     Deitra Mayo

## 2015-07-20 NOTE — Op Note (Signed)
    NAME: [name]   MRN: [id number] DOB: 14-Jun-1951    DATE OF OPERATION: 07/20/2015  PREOP DIAGNOSIS: stage IV chronic kidney disease  POSTOP DIAGNOSIS: same  PROCEDURE: right radial cephalic AV fistula  SURGEON: Judeth Cornfield. Scot Dock, MD, FACS  ASSIST: Gerri Lins PA  ANESTHESIA: local with sedation   EBL: minimal  INDICATIONS: [name] is a 64 y.o. female who is not yet on dialysis. She presents for new access.  FINDINGS: 3 mm cephalic vein  TECHNIQUE: The patient was taken to the operating room and sedated by anesthesia. The right upper extremity was prepped and draped in the usual sterile fashion. By ultrasound, the forearm cephalic vein looked reasonable. However it was fairly far lateral on the forearm. I therefore elected to make 2 separate incisions over the cephalic vein and the radial artery. After the skin was anesthetized with 1% lidocaine, a longitudinal incision was made over the cephalic vein. This was dissected free and branches divided between clips and 3-0 silk ties. The vein was ligated distally and irrigated up nicely with upper nicely healing. It was an approximately 3 mm vein. A separate longitudinal incision was made over the radial artery after the skin was anesthetized. The radial artery was dissected free. A tunnel was created between the 2 incisions and the vein was tunneled between the 2 incisions. The patient was heparinized. The radial artery was clamped proximally and distally and a longitudinal arteriotomy was made. The vein was sewn end to side to the artery using continuous 60 proline suture. At the completion and was a good thrill in the fistula. There was a palpable radial pulse. The heparin was partially reversed with protamine. Each of the wounds was closed with interrupted 3-0 Vicryl. The skin was closed with 4-0 Vicryl. Dermabond was applied. The patient tolerated the procedure well and was transferred to the recovery room in stable  condition. All needle and sponge counts were correct.  Deitra Mayo, MD, FACS [facility]  DATE OF DICTATION:   07/20/2015

## 2015-07-20 NOTE — Transfer of Care (Signed)
Immediate Anesthesia Transfer of Care Note  Patient: [name]  Procedure(s) Performed: Procedure(s): RADIAL CEPHALIC ARTERIOVENOUS FISTULA CREATION RIGHT ARM (Right)  Patient Location: PACU  Anesthesia Type:MAC  Level of Consciousness: awake, alert , oriented and patient cooperative  Airway & Oxygen Therapy: Patient Spontanous Breathing and Patient connected to nasal cannula oxygen  Post-op Assessment: Report given to RN, Post -op Vital signs reviewed and stable and Patient moving all extremities  Post vital signs: Reviewed and stable  Last Vitals:  Filed Vitals:   07/20/15 0656  BP:   Pulse: 65  Temp: 36.6 C  Resp: 18    Complications: No apparent anesthesia complications

## 2015-07-20 NOTE — Anesthesia Preprocedure Evaluation (Addendum)
Anesthesia Evaluation  Patient identified by MRN, date of birth, ID band Patient awake    Reviewed: Allergy & Precautions, NPO status   Airway Mallampati: II  TM Distance: >3 FB     Dental  (+) Edentulous Upper   Pulmonary COPDformer smoker,  breath sounds clear to auscultation        Cardiovascular hypertension, + CAD, + Peripheral Vascular Disease and +CHF + Valvular Problems/Murmurs MR Rhythm:Regular Rate:Normal     Neuro/Psych    GI/Hepatic negative GI ROS,   Endo/Other  diabetesMorbid obesity  Renal/GU Renal InsufficiencyRenal disease     Musculoskeletal   Abdominal (+) + obese,   Peds  Hematology  (+) anemia ,   Anesthesia Other Findings   Reproductive/Obstetrics                            Anesthesia Physical Anesthesia Plan  ASA: III  Anesthesia Plan: MAC   Post-op Pain Management:    Induction: Intravenous  Airway Management Planned: Natural Airway  Additional Equipment:   Intra-op Plan:   Post-operative Plan: Extubation in OR  Informed Consent: I have reviewed the patients History and Physical, chart, labs and discussed the procedure including the risks, benefits and alternatives for the proposed anesthesia with the patient or authorized representative who has indicated his/her understanding and acceptance.   Dental advisory given  Plan Discussed with: CRNA and Surgeon  Anesthesia Plan Comments:         Anesthesia Quick Evaluation

## 2015-07-20 NOTE — Progress Notes (Signed)
Report given to robin roberts rn as caregiver 

## 2015-07-21 ENCOUNTER — Encounter (HOSPITAL_COMMUNITY): Payer: Self-pay | Admitting: Vascular Surgery

## 2015-07-22 ENCOUNTER — Telehealth: Payer: Self-pay | Admitting: Vascular Surgery

## 2015-07-22 NOTE — Telephone Encounter (Signed)
Spoke with pt, dpm °

## 2015-07-22 NOTE — Telephone Encounter (Signed)
-----   Message from Mena Goes, RN sent at 07/20/2015  2:33 PM EDT ----- Regarding: Schedule   ----- Message -----    From: Angelia Mould, MD    Sent: 07/20/2015  10:43 AM      To: [facility] Charge Pool Subject: charge                                         PROCEDURE: right radial cephalic AV fistula  SURGEON: Judeth Cornfield. Scot Dock, MD, FACS  ASSIST: Gerri Lins PA  She will need a follow up visit in 6 weeks to check on the maturation of her fistula. She will need a duplex scan at that time. Thank you. CD

## 2015-07-28 ENCOUNTER — Other Ambulatory Visit: Payer: Self-pay | Admitting: Family Medicine

## 2015-08-11 ENCOUNTER — Telehealth: Payer: Self-pay | Admitting: Family Medicine

## 2015-08-11 ENCOUNTER — Encounter: Payer: Self-pay | Admitting: Family Medicine

## 2015-08-11 ENCOUNTER — Ambulatory Visit (INDEPENDENT_AMBULATORY_CARE_PROVIDER_SITE_OTHER): Payer: Medicare Other | Admitting: Family Medicine

## 2015-08-11 ENCOUNTER — Ambulatory Visit (INDEPENDENT_AMBULATORY_CARE_PROVIDER_SITE_OTHER): Payer: Medicare Other

## 2015-08-11 VITALS — BP 178/74 | HR 67 | Temp 98.4°F | Ht 62.0 in | Wt 208.2 lb

## 2015-08-11 DIAGNOSIS — R0602 Shortness of breath: Secondary | ICD-10-CM

## 2015-08-11 DIAGNOSIS — J441 Chronic obstructive pulmonary disease with (acute) exacerbation: Secondary | ICD-10-CM | POA: Diagnosis not present

## 2015-08-11 MED ORDER — PREDNISONE 20 MG PO TABS: ORAL_TABLET | ORAL | Status: DC

## 2015-08-11 MED ORDER — AZITHROMYCIN 250 MG PO TABS: ORAL_TABLET | ORAL | Status: DC

## 2015-08-11 MED ORDER — PANTOPRAZOLE SODIUM 40 MG PO TBEC: 40.0000 mg | DELAYED_RELEASE_TABLET | Freq: Every day | ORAL | Status: DC

## 2015-08-11 NOTE — Patient Instructions (Signed)

## 2015-08-11 NOTE — Progress Notes (Signed)
BP 178/74 mmHg  Pulse 67  Temp(Src) 98.4 F (36.9 C) (Oral)  Ht 5\' 2"  (1.575 m)  Wt 208 lb 3.2 oz (94.439 kg)  BMI 38.07 kg/m2  SpO2 100%   Subjective:    Patient ID: [name], female    DOB: 02-Mar-1951, 64 y.o.   MRN: [id number]  HPI: [name] is a 64 y.o. female presenting on 08/11/2015 for Cough; Shortness of Breath; and Medication Refill   HPI Shortness of breath Patient has worsening shortness of breath over the past 2-3 days. She describes difficulty laying flat and feeling like she is suffocating when she lays flat. She also has increased swelling in her legs. She denies cough, runny nose, sore throat, fevers or chills. She has both COPD and CHF in her diagnoses and she has been using her albuterol regularly with minimal help. She says she has had similar episodes to this and has been treated with Zithromax and steroids which is greatly improved her breathing. She plans to go see her pulmonologist on Monday.  Relevant past medical, surgical, family and social history reviewed and updated as indicated. Interim medical history since our last visit reviewed. Allergies and medications reviewed and updated.  Review of Systems  Constitutional: Negative for fever and chills.  HENT: Negative for congestion, ear discharge, ear pain, postnasal drip, rhinorrhea, sinus pressure and sore throat.   Eyes: Negative for pain, discharge, redness and visual disturbance.  Respiratory: Positive for chest tightness, shortness of breath and wheezing. Negative for cough.   Cardiovascular: Negative for chest pain, palpitations and leg swelling.  Genitourinary: Negative for dysuria and difficulty urinating.  Musculoskeletal: Negative for back pain and gait problem.  Skin: Negative for rash.  Neurological: Negative for dizziness, light-headedness and headaches.  Psychiatric/Behavioral: Negative for behavioral problems and agitation.  All other systems reviewed and are negative.   Per  HPI unless specifically indicated above     Medication List       This list is accurate as of: 08/11/15  5:40 PM.  Always use your most recent med list.               albuterol 108 (90 BASE) MCG/ACT inhaler  Commonly known as:  PROVENTIL HFA;VENTOLIN HFA  Inhale 2 puffs into the lungs every 6 (six) hours as needed for wheezing or shortness of breath.     aspirin 81 MG EC tablet  Take 81 mg by mouth daily.     atenolol 50 MG tablet  Commonly known as:  TENORMIN  TAKE (1) TABLET TWICE DAILY.     azithromycin 250 MG tablet  Commonly known as:  ZITHROMAX  Take 2 the first day and then one each day after.     budesonide-formoterol 160-4.5 MCG/ACT inhaler  Commonly known as:  SYMBICORT  Inhale 2 puffs into the lungs 2 (two) times daily as needed (shortness of breath).     calcitRIOL 0.25 MCG capsule  Commonly known as:  ROCALTROL  Take 0.25 mcg by mouth daily.     cholecalciferol 1000 UNITS tablet  Commonly known as:  VITAMIN D  Take 1,000 Units by mouth every morning.     CRESTOR 20 MG tablet  Generic drug:  rosuvastatin  TAKE 1 TABLET IN THE EVENING.     gabapentin 300 MG capsule  Commonly known as:  NEURONTIN  Take 1 capsule (300 mg total) by mouth at bedtime. DOSE DECREASED TO ONCE DAILY-AT BEDTIME BECAUSE OF YOUR KIDNEY FUNCTION  glucose blood test strip  Test 4 times daily     hydrALAZINE 50 MG tablet  Commonly known as:  APRESOLINE  Take 1 tablet (50 mg total) by mouth 2 (two) times daily. New medicine for high blood pressure     Insulin Glargine 100 UNIT/ML Solostar Pen  Commonly known as:  LANTUS  Inject 72 Units into the skin daily at 10 pm.     insulin lispro 100 UNIT/ML injection  Commonly known as:  HUMALOG  Inject 0.1-0.13 mLs (10-13 Units total) into the skin 3 (three) times daily with meals. To be administered as directed per sliding scale: 90-150= 10  Units 151-200=11 units 201-250=12 units 251-300= 13 units  To call MD if levels are between  251-300 for three consecutive days     NIFEdipine 90 MG 24 hr tablet  Commonly known as:  PROCARDIA XL/ADALAT-CC  Take 90 mg by mouth every evening.     nitroGLYCERIN 0.4 MG SL tablet  Commonly known as:  NITROSTAT  Place 1 tablet (0.4 mg total) under the tongue every 5 (five) minutes as needed for chest pain.     oxyCODONE-acetaminophen 5-325 MG per tablet  Commonly known as:  PERCOCET/ROXICET  Take 1 tablet by mouth every 6 (six) hours as needed.     pantoprazole 40 MG tablet  Commonly known as:  PROTONIX  Take 1 tablet (40 mg total) by mouth daily. For gastric acid reflux.     potassium chloride 10 MEQ tablet  Commonly known as:  K-DUR  Take 10 mEq by mouth every other day.     predniSONE 20 MG tablet  Commonly known as:  DELTASONE  2 po at sametime daily for 5 days- start tomorrow     THERATEARS OP  Place 2 drops into both eyes as needed (for dry eyes).     torsemide 20 MG tablet  Commonly known as:  DEMADEX  Take 2 tablets (40 mg total) by mouth daily. New medicine to reduce fluid build up.           Objective:    BP 178/74 mmHg  Pulse 67  Temp(Src) 98.4 F (36.9 C) (Oral)  Ht 5\' 2"  (1.575 m)  Wt 208 lb 3.2 oz (94.439 kg)  BMI 38.07 kg/m2  SpO2 100%  Wt Readings from Last 3 Encounters:  08/11/15 208 lb 3.2 oz (94.439 kg)  07/20/15 199 lb (90.266 kg)  07/14/15 207 lb (93.895 kg)    Physical Exam  Constitutional: She is oriented to person, place, and time. She appears well-developed and well-nourished. No distress.  Eyes: Conjunctivae and EOM are normal. Pupils are equal, round, and reactive to light.  Cardiovascular: Normal rate, regular rhythm and normal heart sounds.   No murmur heard. Pulmonary/Chest: Effort normal. No respiratory distress. She has no wheezes. She has rales (bibasilar).  Musculoskeletal: Normal range of motion. She exhibits edema (1+). She exhibits no tenderness.  Neurological: She is alert and oriented to person, place, and time.  Coordination normal.  Skin: Skin is warm and dry. No rash noted. She is not diaphoretic.  Psychiatric: She has a normal mood and affect. Her behavior is normal.  Vitals reviewed.   Results for orders placed or performed during the hospital encounter of 07/20/15  Glucose, capillary  Result Value Ref Range   Glucose-Capillary 129 (H) 65 - 99 mg/dL   Comment 1 Notify RN   I-STAT 4, (NA,K, GLUC, HGB,HCT)  Result Value Ref Range   Sodium 142 135 -  145 mmol/L   Potassium 3.3 (L) 3.5 - 5.1 mmol/L   Glucose, Bld 114 (H) 65 - 99 mg/dL   HCT 35.0 (L) 36.0 - 46.0 %   Hemoglobin 11.9 (L) 12.0 - 15.0 g/dL      Assessment & Plan:   Problem List Items Addressed This Visit    None    Visit Diagnoses    Shortness of breath    -  Primary    Shortness of breath sounds and looks most likely like CHF but patient insists that it's her COPD acting up we will treat for both. take an extra torsemide once     Relevant Medications    azithromycin (ZITHROMAX) 250 MG tablet    predniSONE (DELTASONE) 20 MG tablet    Other Relevant Orders    DG Chest 2 View    COPD exacerbation        Give steroids and Zithromax and follow up with pulmonologist on Monday.    Relevant Medications    azithromycin (ZITHROMAX) 250 MG tablet    predniSONE (DELTASONE) 20 MG tablet        Follow up plan: Return if symptoms worsen or fail to improve.  Caryl Pina, MD [facility] 08/11/2015, 5:40 PM

## 2015-08-16 ENCOUNTER — Ambulatory Visit (INDEPENDENT_AMBULATORY_CARE_PROVIDER_SITE_OTHER): Payer: Medicare Other | Admitting: Family Medicine

## 2015-08-16 VITALS — BP 141/67 | HR 66 | Temp 97.2°F | Ht 62.0 in | Wt 212.8 lb

## 2015-08-16 DIAGNOSIS — E11649 Type 2 diabetes mellitus with hypoglycemia without coma: Secondary | ICD-10-CM | POA: Diagnosis not present

## 2015-08-16 DIAGNOSIS — R0601 Orthopnea: Secondary | ICD-10-CM | POA: Diagnosis not present

## 2015-08-16 DIAGNOSIS — R531 Weakness: Secondary | ICD-10-CM | POA: Diagnosis not present

## 2015-08-16 LAB — POCT HEMOGLOBIN: Hemoglobin: 10.2 g/dL — AB (ref 12.2–16.2)

## 2015-08-16 LAB — GLUCOSE, POCT (MANUAL RESULT ENTRY): POC Glucose: 156 mg/dl — AB (ref 70–99)

## 2015-08-16 NOTE — Progress Notes (Signed)
BP 141/67 mmHg  Pulse 66  Temp(Src) 97.2 F (36.2 C) (Oral)  Ht 5\' 2"  (1.575 m)  Wt 212 lb 12.8 oz (96.525 kg)  BMI 38.91 kg/m2  SpO2 100%   Subjective:    Patient ID: [name], female    DOB: 1951-08-06, 64 y.o.   MRN: [id number]  HPI: [name] is a 64 y.o. female presenting on 08/16/2015 for Hypoglycemia and Fatigue   HPI Weakness Patient has been having recurrent hypoglycemic episodes going down into the 50s over the past week or 2. She was up to 72 units of Lantus at bedtime but is now down to 64 and then lowered slowly because of the hypoglycemic episodes but they are still occurring after she is done 64 about a week ago. She still uses short acting insulin as well to rule out as a sliding scale only though nothing scheduled. A lot of these episodes have been happening at night, or early morning.  Orthopnea Patient has been having more episodes of shortness of breath especially while laying flat. She has been having to sleep more propped up and she had been previously. She denies any cough or shortness of breath while sitting up. She denies any fevers or chills, she denies any chest pain.  Relevant past medical, surgical, family and social history reviewed and updated as indicated. Interim medical history since our last visit reviewed. Allergies and medications reviewed and updated.  Review of Systems  Constitutional: Negative for fever and chills.  HENT: Negative for congestion, ear discharge and ear pain.   Eyes: Negative for redness and visual disturbance.  Respiratory: Positive for shortness of breath (while laying down). Negative for chest tightness.   Cardiovascular: Positive for leg swelling (Slight). Negative for chest pain and palpitations.  Genitourinary: Negative for dysuria and difficulty urinating.  Musculoskeletal: Negative for back pain and gait problem.  Skin: Negative for rash.  Neurological: Positive for dizziness, tremors, weakness and  light-headedness. Negative for syncope, speech difficulty, numbness and headaches.  Psychiatric/Behavioral: Negative for behavioral problems and agitation.  All other systems reviewed and are negative.   Per HPI unless specifically indicated above     Medication List       This list is accurate as of: 08/16/15 12:10 PM.  Always use your most recent med list.               albuterol 108 (90 BASE) MCG/ACT inhaler  Commonly known as:  PROVENTIL HFA;VENTOLIN HFA  Inhale 2 puffs into the lungs every 6 (six) hours as needed for wheezing or shortness of breath.     aspirin 81 MG EC tablet  Take 81 mg by mouth daily.     atenolol 50 MG tablet  Commonly known as:  TENORMIN  TAKE (1) TABLET TWICE DAILY.     azithromycin 250 MG tablet  Commonly known as:  ZITHROMAX  Take 2 the first day and then one each day after.     budesonide-formoterol 160-4.5 MCG/ACT inhaler  Commonly known as:  SYMBICORT  Inhale 2 puffs into the lungs 2 (two) times daily as needed (shortness of breath).     calcitRIOL 0.25 MCG capsule  Commonly known as:  ROCALTROL  Take 0.25 mcg by mouth daily.     cholecalciferol 1000 UNITS tablet  Commonly known as:  VITAMIN D  Take 1,000 Units by mouth every morning.     CRESTOR 20 MG tablet  Generic drug:  rosuvastatin  TAKE 1 TABLET IN  THE EVENING.     gabapentin 300 MG capsule  Commonly known as:  NEURONTIN  Take 1 capsule (300 mg total) by mouth at bedtime. DOSE DECREASED TO ONCE DAILY-AT BEDTIME BECAUSE OF YOUR KIDNEY FUNCTION     glucose blood test strip  Test 4 times daily     hydrALAZINE 50 MG tablet  Commonly known as:  APRESOLINE  Take 1 tablet (50 mg total) by mouth 2 (two) times daily. New medicine for high blood pressure     Insulin Glargine 100 UNIT/ML Solostar Pen  Commonly known as:  LANTUS  Inject 72 Units into the skin daily at 10 pm.     insulin lispro 100 UNIT/ML injection  Commonly known as:  HUMALOG  Inject 0.1-0.13 mLs (10-13  Units total) into the skin 3 (three) times daily with meals. To be administered as directed per sliding scale: 90-150= 10  Units 151-200=11 units 201-250=12 units 251-300= 13 units  To call MD if levels are between 251-300 for three consecutive days     NIFEdipine 90 MG 24 hr tablet  Commonly known as:  PROCARDIA XL/ADALAT-CC  Take 90 mg by mouth every evening.     nitroGLYCERIN 0.4 MG SL tablet  Commonly known as:  NITROSTAT  Place 1 tablet (0.4 mg total) under the tongue every 5 (five) minutes as needed for chest pain.     oxyCODONE-acetaminophen 5-325 MG per tablet  Commonly known as:  PERCOCET/ROXICET  Take 1 tablet by mouth every 6 (six) hours as needed.     pantoprazole 40 MG tablet  Commonly known as:  PROTONIX  Take 1 tablet (40 mg total) by mouth daily. For gastric acid reflux.     potassium chloride 10 MEQ tablet  Commonly known as:  K-DUR  Take 10 mEq by mouth every other day.     predniSONE 20 MG tablet  Commonly known as:  DELTASONE  2 po at sametime daily for 5 days- start tomorrow     THERATEARS OP  Place 2 drops into both eyes as needed (for dry eyes).     torsemide 20 MG tablet  Commonly known as:  DEMADEX  Take 2 tablets (40 mg total) by mouth daily. New medicine to reduce fluid build up.           Objective:    BP 141/67 mmHg  Pulse 66  Temp(Src) 97.2 F (36.2 C) (Oral)  Ht 5\' 2"  (1.575 m)  Wt 212 lb 12.8 oz (96.525 kg)  BMI 38.91 kg/m2  SpO2 100%  Wt Readings from Last 3 Encounters:  08/16/15 212 lb 12.8 oz (96.525 kg)  08/11/15 208 lb 3.2 oz (94.439 kg)  07/20/15 199 lb (90.266 kg)    Physical Exam  Constitutional: She is oriented to person, place, and time. She appears well-developed and well-nourished. No distress.  HENT:  Right Ear: External ear normal.  Left Ear: External ear normal.  Nose: Nose normal.  Mouth/Throat: Oropharynx is clear and moist. No oropharyngeal exudate.  Eyes: Conjunctivae and EOM are normal. Pupils are equal,  round, and reactive to light.  Neck: Neck supple. No thyromegaly present.  Cardiovascular: Normal rate and regular rhythm.   No murmur heard. Pulmonary/Chest: Effort normal and breath sounds normal. No respiratory distress. She has no wheezes. She has no rales.  Musculoskeletal: Normal range of motion. She exhibits edema (1+ bilaterally). She exhibits no tenderness.  Lymphadenopathy:    She has no cervical adenopathy.  Neurological: She is alert and oriented to  person, place, and time. No cranial nerve deficit. Coordination normal.  Skin: Skin is warm and dry. No rash noted. She is not diaphoretic.  Psychiatric: She has a normal mood and affect. Her behavior is normal.  Vitals reviewed.   Results for orders placed or performed in visit on 08/16/15  POCT glucose (manual entry)  Result Value Ref Range   POC Glucose 156 (A) 70 - 99 mg/dl  POCT hemoglobin  Result Value Ref Range   Hemoglobin 10.2 (A) 12.2 - 16.2 g/dL      Assessment & Plan:   Problem List Items Addressed This Visit    None    Visit Diagnoses    Weakness    -  Primary    Patient had weakness likely because of hypoglycemic episodes that are recurrent. We'll lower her Lantus to 55 and then reevaluate.    Relevant Orders    POCT glucose (manual entry) (Completed)    POCT hemoglobin (Completed)    Hypoglycemia associated with diabetes        back off to 55 lantus for now and re-evaluate.    Orthopnea        Patient has increasing shortness of breath while laying flat. Could be because of renal function but we'll check an echocardiogram.    Relevant Orders    Echocardiogram        Follow up plan: Return in about 2 weeks (around 08/30/2015), or if symptoms worsen or fail to improve.  Caryl Pina, MD [facility] 08/16/2015, 12:10 PM

## 2015-08-16 NOTE — Telephone Encounter (Signed)
Okay to call in pantoprazole 40 mg/day for 30 days

## 2015-08-18 ENCOUNTER — Encounter: Payer: Self-pay | Admitting: Family Medicine

## 2015-08-18 MED ORDER — PANTOPRAZOLE SODIUM 40 MG PO TBEC: 40.0000 mg | DELAYED_RELEASE_TABLET | Freq: Every day | ORAL | Status: DC

## 2015-08-19 ENCOUNTER — Ambulatory Visit (HOSPITAL_COMMUNITY)
Admission: RE | Admit: 2015-08-19 | Discharge: 2015-08-19 | Disposition: A | Discharge status: Home / Self Care | Payer: Medicare Other | Source: Ambulatory Visit | Attending: Family Medicine | Admitting: Family Medicine

## 2015-08-19 DIAGNOSIS — Z87891 Personal history of nicotine dependence: Secondary | ICD-10-CM | POA: Diagnosis not present

## 2015-08-19 DIAGNOSIS — R0601 Orthopnea: Secondary | ICD-10-CM | POA: Diagnosis not present

## 2015-08-19 DIAGNOSIS — E119 Type 2 diabetes mellitus without complications: Secondary | ICD-10-CM | POA: Diagnosis not present

## 2015-08-19 DIAGNOSIS — Z794 Long term (current) use of insulin: Secondary | ICD-10-CM | POA: Diagnosis not present

## 2015-08-19 DIAGNOSIS — R0609 Other forms of dyspnea: Secondary | ICD-10-CM | POA: Insufficient documentation

## 2015-08-19 DIAGNOSIS — I10 Essential (primary) hypertension: Secondary | ICD-10-CM | POA: Diagnosis not present

## 2015-08-30 ENCOUNTER — Encounter: Payer: Self-pay | Admitting: Vascular Surgery

## 2015-09-01 ENCOUNTER — Ambulatory Visit (INDEPENDENT_AMBULATORY_CARE_PROVIDER_SITE_OTHER): Payer: Medicare Other | Admitting: Vascular Surgery

## 2015-09-01 ENCOUNTER — Ambulatory Visit (HOSPITAL_COMMUNITY)
Admission: RE | Admit: 2015-09-01 | Discharge: 2015-09-01 | Disposition: A | Discharge status: Home / Self Care | Payer: Medicare Other | Source: Ambulatory Visit | Attending: Vascular Surgery | Admitting: Vascular Surgery

## 2015-09-01 ENCOUNTER — Encounter: Payer: Self-pay | Admitting: Vascular Surgery

## 2015-09-01 VITALS — BP 188/59 | HR 64 | Ht 62.0 in | Wt 204.5 lb

## 2015-09-01 DIAGNOSIS — N186 End stage renal disease: Secondary | ICD-10-CM | POA: Insufficient documentation

## 2015-09-01 DIAGNOSIS — E119 Type 2 diabetes mellitus without complications: Secondary | ICD-10-CM | POA: Insufficient documentation

## 2015-09-01 DIAGNOSIS — I120 Hypertensive chronic kidney disease with stage 5 chronic kidney disease or end stage renal disease: Secondary | ICD-10-CM | POA: Diagnosis not present

## 2015-09-01 DIAGNOSIS — E785 Hyperlipidemia, unspecified: Secondary | ICD-10-CM | POA: Diagnosis not present

## 2015-09-01 DIAGNOSIS — Z4931 Encounter for adequacy testing for hemodialysis: Secondary | ICD-10-CM | POA: Insufficient documentation

## 2015-09-01 DIAGNOSIS — N184 Chronic kidney disease, stage 4 (severe): Secondary | ICD-10-CM

## 2015-09-01 NOTE — Progress Notes (Signed)
Patient name: [name] MRN: [id number] DOB: 02/05/1951 Sex: female  REASON FOR VISIT: follow up after right radiocephalic AV fistula  HPI: [name] is a 64 y.o. female who is not yet on dialysis. She underwent a right radiocephalic AV fistula on [id number]. She comes in for a first outpatient visit. He has no specific complaints. She denies pain or paresthesias in her right upper extremity.  Current Outpatient Prescriptions  Medication Sig Dispense Refill  . albuterol (PROVENTIL HFA;VENTOLIN HFA) 108 (90 BASE) MCG/ACT inhaler Inhale 2 puffs into the lungs every 6 (six) hours as needed for wheezing or shortness of breath. 1 Inhaler 2  . aspirin 81 MG EC tablet Take 81 mg by mouth daily.      [name] atenolol (TENORMIN) 50 MG tablet TAKE (1) TABLET TWICE DAILY. (Patient taking differently: Take 50 mg by mouth twice daily) 60 tablet 5  . azithromycin (ZITHROMAX) 250 MG tablet Take 2 the first day and then one each day after. 6 tablet 0  . budesonide-formoterol (SYMBICORT) 160-4.5 MCG/ACT inhaler Inhale 2 puffs into the lungs 2 (two) times daily as needed (shortness of breath). 1 Inhaler 12  . calcitRIOL (ROCALTROL) 0.25 MCG capsule Take 0.25 mcg by mouth daily.    . Carboxymethylcellulose Sodium (THERATEARS OP) Place 2 drops into both eyes as needed (for dry eyes).    . cholecalciferol (VITAMIN D) 1000 UNITS tablet Take 1,000 Units by mouth every morning.    [name] CRESTOR 20 MG tablet TAKE 1 TABLET IN THE EVENING. 30 tablet 0  . gabapentin (NEURONTIN) 300 MG capsule Take 1 capsule (300 mg total) by mouth at bedtime. DOSE DECREASED TO ONCE DAILY-AT BEDTIME BECAUSE OF YOUR KIDNEY FUNCTION    . glucose blood test strip Test 4 times daily (Patient taking differently: 1 each by Other route See admin instructions. Check blood sugar 3-4 times daily) 100 each 2  . hydrALAZINE (APRESOLINE) 50 MG tablet Take 1 tablet (50 mg total) by mouth 2 (two) times daily. New medicine for high blood pressure 60  tablet 3  . Insulin Glargine (LANTUS) 100 UNIT/ML Solostar Pen Inject 72 Units into the skin daily at 10 pm. (Patient taking differently: Inject 64 Units into the skin daily. ) 30 mL 5  . insulin lispro (HUMALOG) 100 UNIT/ML injection Inject 0.1-0.13 mLs (10-13 Units total) into the skin 3 (three) times daily with meals. To be administered as directed per sliding scale: 90-150= 10  Units 151-200=11 units 201-250=12 units 251-300= 13 units  To call MD if levels are between 251-300 for three consecutive days 10 mL 1  . NIFEdipine (PROCARDIA XL/ADALAT-CC) 90 MG 24 hr tablet Take 90 mg by mouth every evening.     . nitroGLYCERIN (NITROSTAT) 0.4 MG SL tablet Place 1 tablet (0.4 mg total) under the tongue every 5 (five) minutes as needed for chest pain. 25 tablet 5  . oxyCODONE-acetaminophen (PERCOCET/ROXICET) 5-325 MG per tablet Take 1 tablet by mouth every 6 (six) hours as needed. 30 tablet 0  . pantoprazole (PROTONIX) 40 MG tablet Take 1 tablet (40 mg total) by mouth daily. For gastric acid reflux. 30 tablet 3  . potassium chloride (K-DUR) 10 MEQ tablet Take 10 mEq by mouth every other day.     . predniSONE (DELTASONE) 20 MG tablet 2 po at sametime daily for 5 days- start tomorrow 10 tablet 0  . torsemide (DEMADEX) 20 MG tablet Take 2 tablets (40 mg total) by mouth daily. New medicine to reduce fluid  build up. (Patient taking differently: Take 40 mg by mouth daily. Take 20 mg extra tablet if weight is over 3-4 pounds.) 60 tablet 3   No current facility-administered medications for this visit.   REVIEW OF SYSTEMS: Valu.Nieves ] denotes positive finding; [  ] denotes negative finding  CARDIOVASCULAR:  [ ]  chest pain   [ ]  dyspnea on exertion    CONSTITUTIONAL:  [ ]  fever   [ ]  chills  PHYSICAL EXAM: Filed Vitals:   09/01/15 1425 09/01/15 1429  BP: 185/66 188/59  Pulse: 64   Height: 5\' 2"  (1.575 m)   Weight: 204 lb 8 oz (92.761 kg)   SpO2: 100%    GENERAL: The patient is a well-nourished female, in  no acute distress. The vital signs are documented above. CARDIOVASCULAR: There is a regular rate and rhythm. PULMONARY: There is good air exchange bilaterally without wheezing or rales. Her fistula has an excellent bruit and thrill. She has a palpable right radial pulse.  Duplex scan shows that the diameters of her fistula range from 0.54-0.57 cm.  MEDICAL ISSUES:  STAGE IV CHRONIC KIDNEY DISEASE: The fistula does have an excellent thrill although currently the size is not adequate. I have encouraged her to continue to exercise her hand. I have ordered a follow up duplex scan in 2 months and we'll see her back at that time. Hopefully by then the fistula will be adequate size for dialysis if it is needed. If not we could consider a fistulogram.  Deitra Mayo [facility]: [phone]

## 2015-09-02 NOTE — Addendum Note (Signed)
Addended by: Dorthula Rue L on: 09/02/2015 09:31 AM   Modules accepted: Orders

## 2015-09-07 ENCOUNTER — Other Ambulatory Visit: Payer: Self-pay | Admitting: Family Medicine

## 2015-09-23 ENCOUNTER — Encounter: Payer: Self-pay | Admitting: Pediatrics

## 2015-09-23 ENCOUNTER — Ambulatory Visit: Payer: Medicare Other | Admitting: Family Medicine

## 2015-09-23 ENCOUNTER — Ambulatory Visit (INDEPENDENT_AMBULATORY_CARE_PROVIDER_SITE_OTHER): Payer: Medicare Other | Admitting: Pediatrics

## 2015-09-23 VITALS — BP 126/69 | HR 56 | Temp 95.5°F | Ht 62.0 in | Wt 209.6 lb

## 2015-09-23 DIAGNOSIS — N184 Chronic kidney disease, stage 4 (severe): Secondary | ICD-10-CM | POA: Diagnosis not present

## 2015-09-23 DIAGNOSIS — E1121 Type 2 diabetes mellitus with diabetic nephropathy: Secondary | ICD-10-CM

## 2015-09-23 DIAGNOSIS — I5031 Acute diastolic (congestive) heart failure: Secondary | ICD-10-CM

## 2015-09-23 DIAGNOSIS — I10 Essential (primary) hypertension: Secondary | ICD-10-CM | POA: Diagnosis not present

## 2015-09-23 NOTE — Progress Notes (Signed)
Subjective:    Patient ID: [name], female    DOB: 03-22-1951, 64 y.o.   MRN: [id number]  CC: hospital f/u  HPI: [name] is a 64 y.o. female presenting on 09/23/2015 for Med check  Recently discharged from the hospital. Had episode of hypoglycemia this morning, BGL in 40s. Mental status was ok, but pt was shaking. EMS was called, helped get BGL improve. She took 64u of lantus last night Admitted to hospital for CKD, CHF exacerbation and volume overload.  She has a fistula that is maturing in R forearm. Has been otherwise feeling well. No fevers, weakness.  Relevant past medical, surgical, family and social history reviewed and updated as indicated. Interim medical history since our last visit reviewed. Allergies and medications reviewed and updated.   ROS: Per HPI unless specifically indicated above  Past Medical History Patient Active Problem List   Diagnosis Date Noted  . CAP (community acquired pneumonia) 03/08/2015  . Pleural nodules 03/08/2015  . Bronchitis, chronic obstructive w acute bronchitis ([place]) 03/06/2015  . Chronic kidney disease, stage IV (severe) ([place]) 03/06/2015  . Morbid obesity ([place]) 03/06/2015  . Pericardial effusion 03/06/2015  . Acute on chronic diastolic CHF (congestive heart failure) ([place]) 03/05/2015  . PAD (peripheral artery disease) ([place]) 04/21/2014  . Leukocytosis 02/02/2014  . Hypokalemia 09/30/2013  . Dyspnea 09/29/2013  . Abnormal chest x-ray 09/29/2013  . Acute diastolic CHF (congestive heart failure) ([place]) 09/29/2013  . Community acquired pneumonia 09/29/2013  . Type 2 diabetes mellitus with hypoglycemia ([place]) 01/02/2012  . CKD (chronic kidney disease) 01/02/2012  . Diverticulosis 03/29/2011  . Diabetic neuropathy ([place]) 03/29/2011  . Esophageal stricture 03/29/2011  . Vitamin D deficiency 03/29/2011  . Degenerative joint disease of left shoulder 03/29/2011  . Neck pain 03/29/2011  . Type II diabetes mellitus with  nephropathy ([place]) 04/19/2009  . Hyperlipidemia 04/19/2009  . HYPERTENSION, BENIGN 04/19/2009  . Coronary atherosclerosis 04/19/2009  . BRONCHITIS 04/19/2009    Current Outpatient Prescriptions  Medication Sig Dispense Refill  . cloNIDine (CATAPRES) 0.1 MG tablet Take 0.1 mg by mouth 2 (two) times daily.    [name] docusate sodium (COLACE) 100 MG capsule Take 100 mg by mouth 2 (two) times daily.    . polyethylene glycol (MIRALAX / GLYCOLAX) packet Take 17 g by mouth daily.    [name] albuterol (PROVENTIL HFA;VENTOLIN HFA) 108 (90 BASE) MCG/ACT inhaler Inhale 2 puffs into the lungs every 6 (six) hours as needed for wheezing or shortness of breath. 1 Inhaler 2  . aspirin 81 MG EC tablet Take 81 mg by mouth daily.      [name] atenolol (TENORMIN) 50 MG tablet Take 1 tablet (50 mg total) by mouth daily. 60 tablet 5  . budesonide-formoterol (SYMBICORT) 160-4.5 MCG/ACT inhaler Inhale 2 puffs into the lungs 2 (two) times daily. 1 Inhaler 12  . calcitRIOL (ROCALTROL) 0.25 MCG capsule Take 0.25 mcg by mouth daily.    . Carboxymethylcellulose Sodium (THERATEARS OP) Place 2 drops into both eyes as needed (for dry eyes).    . cholecalciferol (VITAMIN D) 1000 UNITS tablet Take 1,000 Units by mouth every morning.    [name] CRESTOR 20 MG tablet TAKE 1 TABLET IN THE EVENING. 30 tablet 0  . gabapentin (NEURONTIN) 300 MG capsule Take 1 capsule (300 mg total) by mouth at bedtime. DOSE DECREASED TO ONCE DAILY-AT BEDTIME BECAUSE OF YOUR KIDNEY FUNCTION    . glucose blood test strip Test 4 times daily (Patient taking differently: 1 each  by Other route See admin instructions. Check blood sugar 3-4 times daily) 100 each 2  . hydrALAZINE (APRESOLINE) 50 MG tablet Take 2 tablets (100 mg total) by mouth 2 (two) times daily. New medicine for high blood pressure 60 tablet 3  . insulin lispro (HUMALOG) 100 UNIT/ML injection Inject 0.1-0.13 mLs (10-13 Units total) into the skin 3 (three) times daily with meals. To be administered as directed per  sliding scale: 90-150= 10  Units 151-200=11 units 201-250=12 units 251-300= 13 units  To call MD if levels are between 251-300 for three consecutive days 10 mL 1  . LANTUS SOLOSTAR 100 UNIT/ML Solostar Pen INJECT 72 UNITS SUBCUTANEOUSLY AT 10PM. 15 mL 3  . NIFEdipine (PROCARDIA XL/ADALAT-CC) 90 MG 24 hr tablet Take 90 mg by mouth every evening.     . nitroGLYCERIN (NITROSTAT) 0.4 MG SL tablet Place 1 tablet (0.4 mg total) under the tongue every 5 (five) minutes as needed for chest pain. 25 tablet 5  . pantoprazole (PROTONIX) 40 MG tablet Take 1 tablet (40 mg total) by mouth daily. For gastric acid reflux. 30 tablet 3  . potassium chloride (K-DUR) 10 MEQ tablet Take 10 mEq by mouth every other day.     . predniSONE (DELTASONE) 20 MG tablet 2 po at sametime daily for 5 days- start tomorrow 10 tablet 0  . torsemide (DEMADEX) 20 MG tablet Take 2 tablets (40 mg total) by mouth daily. New medicine to reduce fluid build up. (Patient taking differently: Take 40 mg by mouth daily. Take 20 mg extra tablet if weight is over 3-4 pounds.) 60 tablet 3   No current facility-administered medications for this visit.       Objective:    BP 126/69 mmHg  Pulse 56  Temp(Src) 95.5 F (35.3 C) (Oral)  Ht _0  (1.575 m)  Wt 209 lb 9.6 oz (95.074 kg)  BMI 38.33 kg/m2  Wt Readings from Last 3 Encounters:  09/23/15 209 lb 9.6 oz (95.074 kg)  09/01/15 204 lb 8 oz (92.761 kg)  08/16/15 212 lb 12.8 oz (96.525 kg)    Gen: NAD, alert, cooperative with exam, NCAT EYES: EOMI, no scleral injection or icterus ENT: MMM CV: NRRR, normal S1/S2, no murmur, distal pulses 2+ b/l Resp: CTABL, no wheezes, normal WOB, no rales Abd: +BS, soft, NTND. no guarding or organomegaly Ext: 1+ pitting edema at ankles b/l, warm Neuro: Alert and oriented, strength equal b/l UE and LE, coordination grossly normal MSK: normal muscle bulk     Assessment & Plan:   Xenia was seen today for med check for hospital f/u.  Diagnoses  and all orders for this visit:  HYPERTENSION, BENIGN Continue medicines as prescribed. Recheck BMP today, last was from a week ago at discharge. -     BMP8+EGFR  Type II diabetes mellitus with nephropathy (HCC) Lantus 50 u at night. Pt to check wakening AM GLs and let me know what they are. WIll likely need to continue to adjust insulin but given renal insufficiency will need to be careful  CKD (chronic kidney disease), stage 4 (severe) (HCC) Euvolemic today. -     TMA2+QJFH  Chronic diastolic CHF s/p recent exacerbation. Euvolemic. No crackles. Continue BP control. Will check weights daily. Let me know what the trend is. Take one extra dose of torsemide today with LE swelling which is new. See pt instructions.   Follow up plan: 2 weeks  [name], MD [facility] 09/23/2015, 1:18 PM

## 2015-09-23 NOTE — Patient Instructions (Addendum)
Low salt diet less than 2000 mg   Take 3 torsemide pills today  Tomorrow go back to taking 2 torsemide pills every morning  Take 50 units of lantus every night  Weigh yourself everyday first thing  Then check your blood sugar level  Send me your weights and sugar levels in 5 days  If your weight goes up more than 3 lbs, call us.

## 2015-09-24 LAB — BMP8+EGFR
BUN/Creatinine Ratio: 17 (ref 11–26)
BUN: 85 mg/dL (ref 8–27)
CO2: 23 mmol/L (ref 18–29)
Calcium: 9 mg/dL (ref 8.7–10.3)
Chloride: 103 mmol/L (ref 97–106)
Creatinine, Ser: 4.92 mg/dL (ref 0.57–1.00)
GFR calc Af Amer: 10 mL/min/{1.73_m2} — ABNORMAL LOW (ref >59)
GFR calc non Af Amer: 9 mL/min/{1.73_m2} — ABNORMAL LOW (ref >59)
Glucose: 102 mg/dL — ABNORMAL HIGH (ref 65–99)
Potassium: 3.7 mmol/L (ref 3.5–5.2)
Sodium: 145 mmol/L — ABNORMAL HIGH (ref 136–144)

## 2015-09-26 MED ORDER — BUDESONIDE-FORMOTEROL FUMARATE 160-4.5 MCG/ACT IN AERO: 2.0000 | INHALATION_SPRAY | Freq: Two times a day (BID) | RESPIRATORY_TRACT | Status: DC

## 2015-09-26 MED ORDER — ATENOLOL 50 MG PO TABS: 50.0000 mg | ORAL_TABLET | Freq: Every day | ORAL | Status: DC

## 2015-09-26 MED ORDER — HYDRALAZINE HCL 50 MG PO TABS: 100.0000 mg | ORAL_TABLET | Freq: Two times a day (BID) | ORAL | Status: DC

## 2015-09-27 NOTE — Telephone Encounter (Signed)
Called patient with Lab results.  Patient stated she has been checking her Blood Sugars and is worried about taking the Medication.  Reading: Friday- Breakfast- 206 took 10 units  Lunch- 64  Dinner-169 took 11 units  Bedtime-79  Saturday-Breakfast- 109      Lunch- 155      Dinner- 126      Bedtime- 292 took 25 units  Sunday- Breakfast- 105     Lunch-  156     Dinner- 152     Bedtime- 235 took 25 units  Monday- Breakfast- 173

## 2015-09-27 NOTE — Telephone Encounter (Signed)
-----   Message from Eustaquio Maize, MD sent at 09/24/2015  2:12 PM EDT ----- Hi [name]-- Your labs from yesterday look similar to what they were when you were in the hospital. Your kidney filtering is still not good. Keep taking the postassium every day. No other changes to your medicines for now. We should recheck your labs in 2 weeks. I will be looking for her weights and sugar levels through MyChart. [name]

## 2015-09-28 ENCOUNTER — Other Ambulatory Visit: Payer: Self-pay | Admitting: Family Medicine

## 2015-09-28 NOTE — Telephone Encounter (Signed)
Last seen 09/23/15  Dr Evette Doffing  Last lipid 01/12/15

## 2015-10-08 ENCOUNTER — Ambulatory Visit (INDEPENDENT_AMBULATORY_CARE_PROVIDER_SITE_OTHER): Payer: Medicare Other | Admitting: Pediatrics

## 2015-10-08 ENCOUNTER — Encounter: Payer: Self-pay | Admitting: Pediatrics

## 2015-10-08 VITALS — BP 137/66 | HR 60 | Temp 96.3°F | Ht 62.0 in | Wt 211.6 lb

## 2015-10-08 DIAGNOSIS — E1121 Type 2 diabetes mellitus with diabetic nephropathy: Secondary | ICD-10-CM

## 2015-10-08 DIAGNOSIS — J440 Chronic obstructive pulmonary disease with acute lower respiratory infection: Secondary | ICD-10-CM | POA: Diagnosis not present

## 2015-10-08 DIAGNOSIS — I5033 Acute on chronic diastolic (congestive) heart failure: Secondary | ICD-10-CM | POA: Diagnosis not present

## 2015-10-08 DIAGNOSIS — I10 Essential (primary) hypertension: Secondary | ICD-10-CM

## 2015-10-08 MED ORDER — GLUCOSE BLOOD VI STRP: ORAL_STRIP | Status: DC

## 2015-10-08 MED ORDER — ALBUTEROL SULFATE (2.5 MG/3ML) 0.083% IN NEBU: 2.5000 mg | INHALATION_SOLUTION | Freq: Four times a day (QID) | RESPIRATORY_TRACT | Status: DC | PRN

## 2015-10-08 NOTE — Patient Instructions (Addendum)
Take 30 units of lantus at night.  If your morning glucose is less than 90 let me know.  Today take 2 torsemide (40mg ) when you get home.  Tomorrow (Saturday) take 2 torsemide in the morning and 2 at 3pm.  Sunday take 2 in the morning.

## 2015-10-08 NOTE — Progress Notes (Signed)
Subjective:    Patient ID: [name], female    DOB: 08/06/1951, 64 y.o.   MRN: [id number]  CC: f/u DM2, CHF  HPI: [name] is a 64 y.o. female presenting on 10/08/2015 for Follow-up  Sleeps on two pillows at night. Noticed more swelling in her LE since splitting her torsemide and taking 20mg  BID instead of 40mg  daily. Brought morning BGLs list in, low of 63, high in mid 100s. Varies the lantus she takes, 0 units to 50 units depending on night time levels which she does nto have written down Takes 0 to 10+ units of humalog TID with meals. Weight up 4 lbs over last week. WAs told by cardiologist goal weight of less than 209 lbs, was 208 at home this morning. Feels her breathing is at baseline No pain with fistula Has follow up with nephrology upcoming.   Relevant past medical, surgical, family and social history reviewed and updated as indicated. Interim medical history since our last visit reviewed. Allergies and medications reviewed and updated.   ROS: Per HPI unless specifically indicated above  Past Medical History Patient Active Problem List   Diagnosis Date Noted  . Pleural nodules 03/08/2015  . Bronchitis, chronic obstructive w acute bronchitis ([place]) 03/06/2015  . Chronic kidney disease, stage IV (severe) ([place]) 03/06/2015  . Morbid obesity ([place]) 03/06/2015  . Pericardial effusion 03/06/2015  . Acute on chronic diastolic CHF (congestive heart failure) ([place]) 03/05/2015  . PAD (peripheral artery disease) ([place]) 04/21/2014  . Leukocytosis 02/02/2014  . Hypokalemia 09/30/2013  . Dyspnea 09/29/2013  . Abnormal chest x-ray 09/29/2013  . Acute diastolic CHF (congestive heart failure) ([place]) 09/29/2013  . Type 2 diabetes mellitus with hypoglycemia ([place]) 01/02/2012  . CKD (chronic kidney disease) 01/02/2012  . Diverticulosis 03/29/2011  . Diabetic neuropathy ([place]) 03/29/2011  . Esophageal stricture 03/29/2011  . Vitamin D deficiency 03/29/2011  . Degenerative  joint disease of left shoulder 03/29/2011  . Neck pain 03/29/2011  . Type II diabetes mellitus with nephropathy ([place]) 04/19/2009  . Hyperlipidemia 04/19/2009  . HYPERTENSION, BENIGN 04/19/2009  . Coronary atherosclerosis 04/19/2009    Current Outpatient Prescriptions  Medication Sig Dispense Refill  . albuterol (PROVENTIL HFA;VENTOLIN HFA) 108 (90 BASE) MCG/ACT inhaler Inhale 2 puffs into the lungs every 6 (six) hours as needed for wheezing or shortness of breath. 1 Inhaler 2  . aspirin 81 MG EC tablet Take 81 mg by mouth daily.      [name] atenolol (TENORMIN) 50 MG tablet Take 1 tablet (50 mg total) by mouth daily. 60 tablet 5  . budesonide-formoterol (SYMBICORT) 160-4.5 MCG/ACT inhaler Inhale 2 puffs into the lungs 2 (two) times daily. 1 Inhaler 12  . calcitRIOL (ROCALTROL) 0.25 MCG capsule Take 0.25 mcg by mouth daily.    . Carboxymethylcellulose Sodium (THERATEARS OP) Place 2 drops into both eyes as needed (for dry eyes).    . cholecalciferol (VITAMIN D) 1000 UNITS tablet Take 1,000 Units by mouth every morning.    . cloNIDine (CATAPRES) 0.1 MG tablet Take 0.1 mg by mouth 2 (two) times daily.    [name] docusate sodium (COLACE) 100 MG capsule Take 100 mg by mouth 2 (two) times daily.    [name] gabapentin (NEURONTIN) 300 MG capsule Take 1 capsule (300 mg total) by mouth at bedtime. DOSE DECREASED TO ONCE DAILY-AT BEDTIME BECAUSE OF YOUR KIDNEY FUNCTION    . glucose blood test strip Test 4 times daily 120 each 5  . hydrALAZINE (APRESOLINE) 50 MG  tablet Take 2 tablets (100 mg total) by mouth 2 (two) times daily. New medicine for high blood pressure 60 tablet 3  . insulin lispro (HUMALOG) 100 UNIT/ML injection Inject 0.1-0.13 mLs (10-13 Units total) into the skin 3 (three) times daily with meals. To be administered as directed per sliding scale: 90-150= 10  Units 151-200=11 units 201-250=12 units 251-300= 13 units  To call MD if levels are between 251-300 for three consecutive days 10 mL 1  . LANTUS  SOLOSTAR 100 UNIT/ML Solostar Pen INJECT 72 UNITS SUBCUTANEOUSLY AT 10PM. 15 mL 3  . NIFEdipine (PROCARDIA XL/ADALAT-CC) 90 MG 24 hr tablet Take 90 mg by mouth every evening.     . nitroGLYCERIN (NITROSTAT) 0.4 MG SL tablet Place 1 tablet (0.4 mg total) under the tongue every 5 (five) minutes as needed for chest pain. 25 tablet 5  . pantoprazole (PROTONIX) 40 MG tablet Take 1 tablet (40 mg total) by mouth daily. For gastric acid reflux. 30 tablet 3  . polyethylene glycol (MIRALAX / GLYCOLAX) packet Take 17 g by mouth daily.    . potassium chloride (K-DUR) 10 MEQ tablet Take 10 mEq by mouth every other day.     . predniSONE (DELTASONE) 20 MG tablet 2 po at sametime daily for 5 days- start tomorrow 10 tablet 0  . rosuvastatin (CRESTOR) 20 MG tablet TAKE 1 TABLET IN THE EVENING. 30 tablet 5  . torsemide (DEMADEX) 20 MG tablet Take 2 tablets (40 mg total) by mouth daily. New medicine to reduce fluid build up. (Patient taking differently: Take 40 mg by mouth daily. Take 20 mg extra tablet if weight is over 3-4 pounds.) 60 tablet 3  . albuterol (PROVENTIL) (2.5 MG/3ML) 0.083% nebulizer solution Take 3 mLs (2.5 mg total) by nebulization every 6 (six) hours as needed for wheezing or shortness of breath. 75 mL 12   No current facility-administered medications for this visit.       Objective:    BP 137/66 mmHg  Pulse 60  Temp(Src) 96.3 F (35.7 C) (Oral)  Ht 5\' 2"  (1.575 m)  Wt 211 lb 9.6 oz (95.981 kg)  BMI 38.69 kg/m2  Wt Readings from Last 3 Encounters:  10/08/15 211 lb 9.6 oz (95.981 kg)  09/23/15 209 lb 9.6 oz (95.074 kg)  09/01/15 204 lb 8 oz (92.761 kg)     Gen: NAD, alert, cooperative with exam, NCAT EYES: EOMI, no scleral injection or icterus ENT:   OP without erythema LYMPH: no cervical LAD CV: NRRR, WWP Resp: CTABL, no wheezes, normal WOB Ext: 1+ pitting edema LE b/l Neuro: Alert and oriented, strength equal b/l UE and LE, coordination grossly normal MSK: normal muscle bulk      Assessment & Plan:   [name] was seen today for follow-up mulitple med problems.  Diagnoses and all orders for this visit:  HYPERTENSION, BENIGN Well controlled today. Continue current meds.  Acute on chronic diastolic CHF (congestive heart failure) (HCC) Sleeps on 2 pillows at baseline. Extra swelling LE. Take torsemide 40mg  daily Today and tomorrow take extra 40mg  in afternoon Weigh daily  Type II diabetes mellitus with nephropathy (HCC) HgA1c 5.8 most recently, has had some low blood sugars. Take 30units of lantus at night. Let me know if any morning lows. Has SSI that she takes with meals. -     glucose blood test strip; Test 4 times daily  Bronchitis, chronic obstructive w acute bronchitis (HCC) Breathing at baseline today. Needs refill.  -  albuterol (PROVENTIL) (2.5 MG/3ML) 0.083% nebulizer solution; Take 3 mLs (2.5 mg total) by nebulization every 6 (six) hours as needed for wheezing or shortness of breath.  Follow up plan: 4 weeks  [name], MD [facility] 10/08/2015, 11:35 AM

## 2015-10-12 ENCOUNTER — Other Ambulatory Visit: Payer: Self-pay | Admitting: Family Medicine

## 2015-10-22 ENCOUNTER — Telehealth: Payer: Self-pay | Admitting: Pediatrics

## 2015-10-22 ENCOUNTER — Other Ambulatory Visit: Payer: Self-pay | Admitting: *Deleted

## 2015-10-22 DIAGNOSIS — J440 Chronic obstructive pulmonary disease with acute lower respiratory infection: Secondary | ICD-10-CM

## 2015-10-22 MED ORDER — ALBUTEROL SULFATE (2.5 MG/3ML) 0.083% IN NEBU: 2.5000 mg | INHALATION_SOLUTION | Freq: Four times a day (QID) | RESPIRATORY_TRACT | Status: DC | PRN

## 2015-10-22 NOTE — Telephone Encounter (Signed)
Question answered. 

## 2015-10-25 ENCOUNTER — Ambulatory Visit (INDEPENDENT_AMBULATORY_CARE_PROVIDER_SITE_OTHER): Payer: Medicare Other | Admitting: Family Medicine

## 2015-10-25 VITALS — BP 134/57 | HR 65 | Temp 97.7°F | Ht 62.0 in | Wt 206.4 lb

## 2015-10-25 DIAGNOSIS — E1121 Type 2 diabetes mellitus with diabetic nephropathy: Secondary | ICD-10-CM | POA: Diagnosis not present

## 2015-10-25 DIAGNOSIS — R0600 Dyspnea, unspecified: Secondary | ICD-10-CM | POA: Diagnosis not present

## 2015-10-25 DIAGNOSIS — K5901 Slow transit constipation: Secondary | ICD-10-CM

## 2015-10-25 DIAGNOSIS — K5900 Constipation, unspecified: Secondary | ICD-10-CM | POA: Insufficient documentation

## 2015-10-25 DIAGNOSIS — K222 Esophageal obstruction: Secondary | ICD-10-CM | POA: Diagnosis not present

## 2015-10-25 MED ORDER — PANTOPRAZOLE SODIUM 40 MG PO TBEC
40.0000 mg | DELAYED_RELEASE_TABLET | Freq: Two times a day (BID) | ORAL | Status: DC
Start: 2015-10-25 — End: 2016-02-12

## 2015-10-25 MED ORDER — LINACLOTIDE 290 MCG PO CAPS: 290.0000 ug | ORAL_CAPSULE | Freq: Every day | ORAL | Status: DC

## 2015-10-25 NOTE — Progress Notes (Signed)
Subjective:  Patient ID: [name], female    DOB: 1951/02/20  Age: 63 y.o. MRN: [id number]  CC: Shortness of Breath; feels like something is stuck in her throat; and Joint Swelling   HPI [name] presents for something in her throat for months. Was dyspneic before hospitalization. Swollen as well. Now denies both. Was at [facility] from 11/13 to 11/17. Sometimes food comes back up. It seems to get stuck and then she'll have to regurgitate to bring it back up. Sometimes it goes down without difficulty. She has had a dilation of esophageal stricture in the past. This is not dissimilar to that.  Also has a recently placed dialysis shunt access  In right forearm. Hemodialysis planned for the near future. This is due to her diabetic nephropathy.  Feels constipated, bloated in the abdomen intermittently for several weeks. No melena or hematochezia noted. She does have bowel movements but they tend to be irregular. She is not able to tell just how often she does have a bowel movement. But they seemed normal when she does have one.  History [name] has a past medical history of Esophageal stricture; Hyperlipidemia; Hypertension; Bronchitis; CAD (coronary artery disease); Arthritis; IDDM (insulin dependent diabetes mellitus) ([place]); Diverticulosis; Diabetic neuropathy ([place]); Vitamin D deficiency; Degenerative joint disease; Chronic neck pain; Headache(784.0); Anemia; Stroke ([place]); Chronic kidney disease; CHF (congestive heart failure) ([place]); Pericardial effusion (03/06/2015); Acute on chronic diastolic CHF (congestive heart failure) ([place]) (03/05/2015); COPD (chronic obstructive pulmonary disease) ([place]); and Peripheral vascular disease ([employer] [facility]).   She has past surgical history that includes Bladder surgery; Partial hysterectomy; Shoulder surgery (Right); Cervical spine surgery (2012); Abdominal hysterectomy; Laminectomy (12/29/2011); cardiac stents; Lumbar laminectomy/decompression microdiscectomy  (12/29/2011); Back surgery; Cystoscopy with injection (N/A, 06/03/2013); Cardiac catheterization (2003, 2005, 2009); Cataract extraction w/PHACO (Right, 05/04/2014); Cataract extraction w/PHACO (Left, 07/20/2014); and AV fistula placement (Right, 07/20/2015).   Her family history includes Cancer in her father; Heart disease in her other; Hypertension in her daughter and father.She reports that she quit smoking about 13 years ago. She has never used smokeless tobacco. She reports that she drinks about 3.6 oz of alcohol per week. She reports that she does not use illicit drugs.  Outpatient Prescriptions Prior to Visit  Medication Sig Dispense Refill  . albuterol (PROVENTIL HFA;VENTOLIN HFA) 108 (90 BASE) MCG/ACT inhaler Inhale 2 puffs into the lungs every 6 (six) hours as needed for wheezing or shortness of breath. 1 Inhaler 2  . albuterol (PROVENTIL) (2.5 MG/3ML) 0.083% nebulizer solution Take 3 mLs (2.5 mg total) by nebulization every 6 (six) hours as needed for wheezing or shortness of breath. 75 mL 12  . aspirin 81 MG EC tablet Take 81 mg by mouth daily.      [name] atenolol (TENORMIN) 50 MG tablet Take 1 tablet (50 mg total) by mouth daily. 60 tablet 5  . budesonide-formoterol (SYMBICORT) 160-4.5 MCG/ACT inhaler Inhale 2 puffs into the lungs 2 (two) times daily. 1 Inhaler 12  . calcitRIOL (ROCALTROL) 0.25 MCG capsule Take 0.25 mcg by mouth daily.    . Carboxymethylcellulose Sodium (THERATEARS OP) Place 2 drops into both eyes as needed (for dry eyes).    . cholecalciferol (VITAMIN D) 1000 UNITS tablet Take 1,000 Units by mouth every morning.    . cloNIDine (CATAPRES) 0.1 MG tablet Take 0.1 mg by mouth 2 (two) times daily.    [name] docusate sodium (COLACE) 100 MG capsule Take 100 mg by mouth 2 (two) times daily.    [name] gabapentin (  NEURONTIN) 300 MG capsule Take 1 capsule (300 mg total) by mouth at bedtime. DOSE DECREASED TO ONCE DAILY-AT BEDTIME BECAUSE OF YOUR KIDNEY FUNCTION    . glucose blood test strip Test 4  times daily 120 each 5  . hydrALAZINE (APRESOLINE) 50 MG tablet Take 2 tablets (100 mg total) by mouth 2 (two) times daily. New medicine for high blood pressure 60 tablet 3  . insulin lispro (HUMALOG) 100 UNIT/ML injection Inject 0.1-0.13 mLs (10-13 Units total) into the skin 3 (three) times daily with meals. To be administered as directed per sliding scale: 90-150= 10  Units 151-200=11 units 201-250=12 units 251-300= 13 units  To call MD if levels are between 251-300 for three consecutive days 10 mL 1  . LANTUS SOLOSTAR 100 UNIT/ML Solostar Pen INJECT 72 UNITS SUBCUTANEOUSLY AT 10PM. (Patient taking differently: 30 units at bedtime) 15 mL 3  . NIFEdipine (PROCARDIA XL/ADALAT-CC) 90 MG 24 hr tablet Take 90 mg by mouth every evening.     . nitroGLYCERIN (NITROSTAT) 0.4 MG SL tablet Place 1 tablet (0.4 mg total) under the tongue every 5 (five) minutes as needed for chest pain. 25 tablet 5  . polyethylene glycol (MIRALAX / GLYCOLAX) packet Take 17 g by mouth daily.    . potassium chloride (K-DUR) 10 MEQ tablet Take 20 mEq by mouth every other day.     . rosuvastatin (CRESTOR) 20 MG tablet TAKE 1 TABLET IN THE EVENING. 30 tablet 5  . torsemide (DEMADEX) 20 MG tablet Take 2 tablets (40 mg total) by mouth daily. New medicine to reduce fluid build up. (Patient taking differently: Take 40 mg by mouth daily. Take 20 mg extra tablet if weight is over 3-4 pounds.) 60 tablet 3  . pantoprazole (PROTONIX) 40 MG tablet Take 1 tablet (40 mg total) by mouth daily. For gastric acid reflux. 30 tablet 3  . atenolol (TENORMIN) 50 MG tablet TAKE (1) TABLET TWICE DAILY. 60 tablet 5  . predniSONE (DELTASONE) 20 MG tablet 2 po at sametime daily for 5 days- start tomorrow (Patient not taking: Reported on 10/25/2015) 10 tablet 0   No facility-administered medications prior to visit.    ROS Review of Systems  Constitutional: Negative for fever, activity change and appetite change.  HENT: Positive for trouble  swallowing. Negative for congestion, rhinorrhea and sore throat.   Eyes: Negative for visual disturbance.  Respiratory: Negative for cough and shortness of breath.   Cardiovascular: Negative for chest pain and palpitations.  Gastrointestinal: Positive for abdominal pain and abdominal distention. Negative for nausea and diarrhea.  Genitourinary: Negative for dysuria.  Musculoskeletal: Negative for myalgias and arthralgias.    Objective:  BP 134/57 mmHg  Pulse 65  Temp(Src) 97.7 F (36.5 C) (Oral)  Ht 5\' 2"  (1.575 m)  Wt 206 lb 6.4 oz (93.622 kg)  BMI 37.74 kg/m2  BP Readings from Last 3 Encounters:  10/25/15 134/57  10/08/15 137/66  09/23/15 126/69    Wt Readings from Last 3 Encounters:  10/25/15 206 lb 6.4 oz (93.622 kg)  10/08/15 211 lb 9.6 oz (95.981 kg)  09/23/15 209 lb 9.6 oz (95.074 kg)     Physical Exam  Constitutional: She is oriented to person, place, and time. She appears well-developed and well-nourished. No distress.  HENT:  Head: Normocephalic and atraumatic.  Right Ear: External ear normal.  Left Ear: External ear normal.  Nose: Nose normal.  Mouth/Throat: Oropharynx is clear and moist.  Eyes: Conjunctivae and EOM are normal. Pupils are equal, round,  and reactive to light.  Neck: Normal range of motion. Neck supple. No thyromegaly present.  Cardiovascular: Normal rate, regular rhythm and normal heart sounds.   No murmur heard. Pulmonary/Chest: Effort normal and breath sounds normal. No respiratory distress. She has no wheezes. She has no rales.  Abdominal: Soft. Bowel sounds are normal. She exhibits no distension. There is no tenderness.  Lymphadenopathy:    She has no cervical adenopathy.  Neurological: She is alert and oriented to person, place, and time. She has normal reflexes.  Skin: Skin is warm and dry.  Psychiatric: She has a normal mood and affect. Her behavior is normal. Judgment and thought content normal.    Lab Results  Component Value  Date   HGBA1C 6.7 06/22/2015   HGBA1C 7.8* 03/08/2015   HGBA1C 7.6* 03/06/2015    Lab Results  Component Value Date   WBC 14.3* 05/31/2015   HGB 10.2* 08/16/2015   HCT 35.0* 07/20/2015   PLT 376 03/09/2015   GLUCOSE 102* 09/23/2015   CHOL 235* 01/12/2015   TRIG 265* 01/12/2015   HDL 45 01/12/2015   LDLCALC 137* 01/12/2015   ALT 34 03/05/2015   AST 38* 03/05/2015   NA 145* 09/23/2015   K 3.7 09/23/2015   CL 103 09/23/2015   CREATININE 4.92* 09/23/2015   BUN 85* 09/23/2015   CO2 23 09/23/2015   TSH 2.261 03/05/2015   INR 1.0 06/17/2008   HGBA1C 6.7 06/22/2015    No results found.  Assessment & Plan:   Ferrel was seen today for shortness of breath, feels like something is stuck in her throat and joint swelling.  Diagnoses and all orders for this visit:  Type II diabetes mellitus with nephropathy ([place])  Esophageal stricture -     Ambulatory referral to Gastroenterology  Slow transit constipation  Dyspnea  Other orders -     Linaclotide (LINZESS) 290 MCG CAPS capsule; Take 1 capsule (290 mcg total) by mouth daily. -     pantoprazole (PROTONIX) 40 MG tablet; Take 1 tablet (40 mg total) by mouth 2 (two) times daily. For gastric acid reflux.   I have discontinued Ms. Storlie's predniSONE. I have also changed her pantoprazole. Additionally, I am having her start on Linaclotide. Lastly, I am having her maintain her aspirin, NIFEdipine, cholecalciferol, potassium chloride, insulin lispro, gabapentin, torsemide, albuterol, nitroGLYCERIN, calcitRIOL, Carboxymethylcellulose Sodium (THERATEARS OP), LANTUS SOLOSTAR, atenolol, budesonide-formoterol, hydrALAZINE, cloNIDine, docusate sodium, polyethylene glycol, rosuvastatin, glucose blood, and albuterol.  Meds ordered this encounter  Medications  . Linaclotide (LINZESS) 290 MCG CAPS capsule    Sig: Take 1 capsule (290 mcg total) by mouth daily.    Dispense:  30 capsule    Refill:  2  . pantoprazole (PROTONIX) 40 MG tablet     Sig: Take 1 tablet (40 mg total) by mouth 2 (two) times daily. For gastric acid reflux.    Dispense:  60 tablet    Refill:  3     Follow-up: No Follow-up on file.  [name], M.D.

## 2015-10-26 ENCOUNTER — Encounter: Payer: Self-pay | Admitting: Gastroenterology

## 2015-10-26 ENCOUNTER — Telehealth: Payer: Self-pay | Admitting: Family Medicine

## 2015-10-27 ENCOUNTER — Ambulatory Visit: Payer: Medicare Other | Admitting: Family Medicine

## 2015-10-27 ENCOUNTER — Telehealth: Payer: Self-pay

## 2015-10-27 NOTE — Telephone Encounter (Signed)
Insurance denied prior authorization due to not meeting FDA criteria  [place]

## 2015-11-01 ENCOUNTER — Encounter: Payer: Self-pay | Admitting: Vascular Surgery

## 2015-11-03 ENCOUNTER — Ambulatory Visit (HOSPITAL_COMMUNITY)
Admission: RE | Admit: 2015-11-03 | Discharge: 2015-11-03 | Disposition: A | Discharge status: Home / Self Care | Payer: Medicare Other | Source: Ambulatory Visit | Attending: Family | Admitting: Family

## 2015-11-03 ENCOUNTER — Encounter: Payer: Self-pay | Admitting: Vascular Surgery

## 2015-11-03 ENCOUNTER — Ambulatory Visit (INDEPENDENT_AMBULATORY_CARE_PROVIDER_SITE_OTHER): Payer: Medicare Other | Admitting: Vascular Surgery

## 2015-11-03 ENCOUNTER — Ambulatory Visit (INDEPENDENT_AMBULATORY_CARE_PROVIDER_SITE_OTHER): Payer: Medicare Other | Admitting: Family Medicine

## 2015-11-03 ENCOUNTER — Other Ambulatory Visit: Payer: Self-pay

## 2015-11-03 VITALS — BP 152/62 | HR 56 | Temp 97.9°F | Resp 18 | Ht 62.0 in | Wt 200.0 lb

## 2015-11-03 DIAGNOSIS — N184 Chronic kidney disease, stage 4 (severe): Secondary | ICD-10-CM

## 2015-11-03 DIAGNOSIS — E1122 Type 2 diabetes mellitus with diabetic chronic kidney disease: Secondary | ICD-10-CM | POA: Diagnosis not present

## 2015-11-03 DIAGNOSIS — I509 Heart failure, unspecified: Secondary | ICD-10-CM | POA: Diagnosis not present

## 2015-11-03 DIAGNOSIS — I1310 Hypertensive heart and chronic kidney disease without heart failure, with stage 1 through stage 4 chronic kidney disease, or unspecified chronic kidney disease: Secondary | ICD-10-CM | POA: Diagnosis not present

## 2015-11-03 NOTE — Progress Notes (Signed)
[facility] [place]  Patient name: [name] MRN: [id number] DOB: May 11, 1951 Sex: female  REASON FOR VISIT: to evaluate poorly maturing right radiocephalic AV fistula. Referred by Dr. Lowanda Foster.  HPI: [name] is a 64 y.o. female who is not yet on dialysis but is nearing the need for dialysis. She had a right radiocephalic AV fistula placed on 07/20/2015. The cephalic vein was 3 mm. I saw the patient in follow up on 09/01/2015. At that time, the diameters of fistula ranged from 0.54-0.57 cm. The fistula had an excellent thrill and I thought it would likely be adequate for access. However, she returns now and the fistula has not continued to mature. She has been admitted with some shortness of breath and may need dialysis sooner than later and we were asked to reevaluate her fistula. She denies any shortness of breath today or nausea or vomiting.  Past Medical History  Diagnosis Date  . Esophageal stricture   . Hyperlipidemia   . Hypertension   . Bronchitis   . CAD (coronary artery disease)     s/p Stenting in 2005;  [place] 7/09: Vigorous LV function, 2-3+ MR, RI 40%, RI stent patent, proximal-mid RCA 40-50%;  [place] 7/09:  EF 55-65%, trivial MR;  [place] 11/18/12: EF 66%, normal LV wall motion, mild to moderate anterior ischemia - reviewed by [facility] and felt to rep breast atten (low risk);  Echo 6/14: mild LVH, EF 55-65%, Gr 2 DD, PASP 44, trivial eff   . Arthritis   . IDDM (insulin dependent diabetes mellitus) ([place])   . Diverticulosis   . Diabetic neuropathy ([place])   . Vitamin D deficiency   . Degenerative joint disease     left shoulder  . Chronic neck pain   . Headache(784.0)   . Anemia     hx of  . Stroke [facility])     "mini- years ago"  . Chronic kidney disease     CREATININE IS UP--GOING TO KIDNEY MD   . CHF (congestive heart failure) ([place])   . Pericardial effusion 03/06/2015    Very small per echo ; pleural nodules seen on CT chest.  . Acute on chronic  diastolic CHF (congestive heart failure) ([place]) 03/05/2015    Grade 2. EF 60-65%  . COPD (chronic obstructive pulmonary disease) ([place])   . Peripheral vascular disease ([place])     Family History  Problem Relation Age of Onset  . Heart disease Other   . Hypertension Father   . Cancer Father   . Hypertension Daughter     SOCIAL HISTORY: Social History  Substance Use Topics  . Smoking status: Former Smoker -- 1.00 packs/day for 30 years    Quit date: 12/04/2001  . Smokeless tobacco: Never Used  . Alcohol Use: 3.6 oz/week    6 Cans of beer per week     Comment: occasional    Allergies  Allergen Reactions  . Clopidogrel Bisulfate Other (See Comments)    REACTION: Sick, Headache, "Felt terrible"  . Codeine Other (See Comments)    REACTION: hallucinations  . Ace Inhibitors Other (See Comments)    cough  . Lisinopril Other (See Comments)    cough  . Penicillins Other (See Comments)    unknown    Current Outpatient Prescriptions  Medication Sig Dispense Refill  . albuterol (PROVENTIL HFA;VENTOLIN HFA) 108 (90 BASE) MCG/ACT inhaler Inhale 2 puffs into the lungs every 6 (six) hours as needed for wheezing or shortness of  breath. 1 Inhaler 2  . albuterol (PROVENTIL) (2.5 MG/3ML) 0.083% nebulizer solution Take 3 mLs (2.5 mg total) by nebulization every 6 (six) hours as needed for wheezing or shortness of breath. 75 mL 12  . aspirin 81 MG EC tablet Take 81 mg by mouth daily.      [name] atenolol (TENORMIN) 50 MG tablet Take 1 tablet (50 mg total) by mouth daily. 60 tablet 5  . budesonide-formoterol (SYMBICORT) 160-4.5 MCG/ACT inhaler Inhale 2 puffs into the lungs 2 (two) times daily. 1 Inhaler 12  . calcitRIOL (ROCALTROL) 0.25 MCG capsule Take 0.25 mcg by mouth daily.    . Carboxymethylcellulose Sodium (THERATEARS OP) Place 2 drops into both eyes as needed (for dry eyes).    . cholecalciferol (VITAMIN D) 1000 UNITS tablet Take 1,000 Units by mouth every morning.    . cloNIDine (CATAPRES) 0.1  MG tablet Take 0.1 mg by mouth 2 (two) times daily.    [name] docusate sodium (COLACE) 100 MG capsule Take 100 mg by mouth 2 (two) times daily.    [name] gabapentin (NEURONTIN) 300 MG capsule Take 1 capsule (300 mg total) by mouth at bedtime. DOSE DECREASED TO ONCE DAILY-AT BEDTIME BECAUSE OF YOUR KIDNEY FUNCTION    . glucose blood test strip Test 4 times daily 120 each 5  . hydrALAZINE (APRESOLINE) 50 MG tablet Take 2 tablets (100 mg total) by mouth 2 (two) times daily. New medicine for high blood pressure 60 tablet 3  . insulin lispro (HUMALOG) 100 UNIT/ML injection Inject 0.1-0.13 mLs (10-13 Units total) into the skin 3 (three) times daily with meals. To be administered as directed per sliding scale: 90-150= 10  Units 151-200=11 units 201-250=12 units 251-300= 13 units  To call MD if levels are between 251-300 for three consecutive days 10 mL 1  . LANTUS SOLOSTAR 100 UNIT/ML Solostar Pen INJECT 72 UNITS SUBCUTANEOUSLY AT 10PM. (Patient taking differently: 30 units at bedtime) 15 mL 3  . NIFEdipine (PROCARDIA XL/ADALAT-CC) 90 MG 24 hr tablet Take 90 mg by mouth every evening.     . nitroGLYCERIN (NITROSTAT) 0.4 MG SL tablet Place 1 tablet (0.4 mg total) under the tongue every 5 (five) minutes as needed for chest pain. 25 tablet 5  . pantoprazole (PROTONIX) 40 MG tablet Take 1 tablet (40 mg total) by mouth 2 (two) times daily. For gastric acid reflux. 60 tablet 3  . polyethylene glycol (MIRALAX / GLYCOLAX) packet Take 17 g by mouth daily.    . potassium chloride (K-DUR) 10 MEQ tablet Take 20 mEq by mouth every other day.     . rosuvastatin (CRESTOR) 20 MG tablet TAKE 1 TABLET IN THE EVENING. 30 tablet 5  . torsemide (DEMADEX) 20 MG tablet Take 2 tablets (40 mg total) by mouth daily. New medicine to reduce fluid build up. (Patient taking differently: Take 40 mg by mouth daily. Take 20 mg extra tablet if weight is over 3-4 pounds.) 60 tablet 3  . Linaclotide (LINZESS) 290 MCG CAPS capsule Take 1 capsule (290  mcg total) by mouth daily. (Patient not taking: Reported on 11/03/2015) 30 capsule 2   No current facility-administered medications for this visit.    REVIEW OF SYSTEMS:  [X]  denotes positive finding, [ ]  denotes negative finding Cardiac  Comments:  Chest pain or chest pressure: X   Shortness of breath upon exertion: X   Short of breath when lying flat: X   Irregular heart rhythm:        Vascular  Pain in calf, thigh, or hip brought on by ambulation:    Pain in feet at night that wakes you up from your sleep:     Blood clot in your veins:    Leg swelling:         Pulmonary    Oxygen at home:    Productive cough:     Wheezing:  X       Neurologic    Sudden weakness in arms or legs:     Sudden numbness in arms or legs:     Sudden onset of difficulty speaking or slurred speech:    Temporary loss of vision in one eye:     Problems with dizziness:         Gastrointestinal    Blood in stool:     Vomited blood:         Genitourinary    Burning when urinating:     Blood in urine:        Psychiatric    Major depression:         Hematologic    Bleeding problems:    Problems with blood clotting too easily:        Skin    Rashes or ulcers:        Constitutional    Fever or chills:      PHYSICAL EXAM: Filed Vitals:   11/03/15 1244 11/03/15 1246  BP: 150/66 152/62  Pulse: 56 56  Temp: 97.9 F (36.6 C)   TempSrc: Oral   Resp: 18   Height: 5\' 2"  (1.575 m)   Weight: 200 lb (90.719 kg)   SpO2: 98%     GENERAL: The patient is a well-nourished female, in no acute distress. The vital signs are documented above. CARDIAC: There is a regular rate and rhythm.  VASCULAR: her right radiocephalic fistula has a good thrill although the vein is somewhat small. She has a palpable right radial pulse. PULMONARY: There is good air exchange bilaterally without wheezing or rales. NEUROLOGIC: No focal weakness or paresthesias are detected. SKIN: There are no ulcers or rashes  noted. PSYCHIATRIC: The patient has a normal affect.  DATA:  I have independently interpreted her right upper extremity dialysis duplex. The radiocephalic fistula has multiple areas of narrowing within the fistula and is not adequate in size in the forearm. The upper arm cephalic vein looks reasonable in size.  MEDICAL ISSUES:  STAGE IV CHRONIC KIDNEY DISEASE: I reviewed her duplex today which shows the diameters of the fistula are not adequate in the forearm. Diameters range from 0.26-0.43 cm. There are also multiple areas of stenosis within the fistula. I have discussed our options with her. I have offered her the option of proceeding with a fistulogram with limited contrast to see if there is anything that can be done to improve the maturation of her right radiocephalic fistula. This would include possible angioplasty of a vein graft stenosis. If this were not possible this would help [place] determine if there were any surgical options for revision. I've explained that I think the chances are unlikely that we will be able to salvage the fistula as there appeared to be multiple areas of narrowing in the vein is simply not maturing adequately. I have also offered her the option of proceeding with placement of a new right brachiocephalic fistula as this vein looks to be larger in size. It too was small in some areas however. If she needs dialysis in the near future we  could certainly place a catheter at the same time. After much discussion she would like to proceed with a fistulogram and this has been scheduled for 11/22/2015.   Deitra Mayo [facility] [place]: [phone]

## 2015-11-05 ENCOUNTER — Encounter: Payer: Self-pay | Admitting: Cardiology

## 2015-11-05 ENCOUNTER — Ambulatory Visit (INDEPENDENT_AMBULATORY_CARE_PROVIDER_SITE_OTHER): Payer: Medicare Other | Admitting: Cardiology

## 2015-11-05 VITALS — BP 152/66 | HR 68 | Ht 63.0 in | Wt 200.4 lb

## 2015-11-05 DIAGNOSIS — I5031 Acute diastolic (congestive) heart failure: Secondary | ICD-10-CM | POA: Diagnosis not present

## 2015-11-05 MED ORDER — LORAZEPAM 0.5 MG PO TABS: 0.5000 mg | ORAL_TABLET | Freq: Three times a day (TID) | ORAL | Status: DC

## 2015-11-05 MED ORDER — ISOSORBIDE MONONITRATE ER 60 MG PO TB24: 60.0000 mg | ORAL_TABLET | Freq: Every day | ORAL | Status: DC

## 2015-11-05 NOTE — Patient Instructions (Signed)
Your physician recommends that you schedule a follow-up appointment in: 1 Month in [place] has recommended you make the following change in your medication: START Isosorbide 60 mg daily and Ativan 0.5 mg, If you gain 2 lbs in a day or increase swelling take an extra dose of furosemide(Lasix)

## 2015-11-05 NOTE — Progress Notes (Addendum)
HPI The patient presents for follow up of CAD and diastolic heart failure.  Since I last saw her she was hospitalized twice ate [place] with diastolic dysfunction. I reviewed some of these records.  She says that in October the symptoms came on somewhat slowly but more rapidly in November. She does have end-stage renal disease and is being considered electively for dialysis. She had a fistula placed in her right arm is not quite working.  It does sound like she is paying much more attention to weights and volume management and salt restriction since her second hospitalization. She's not had any of the acute shortness of breath. She does get anxious because this was such as scary event. She is not having any PND or orthopnea. She's not having any palpitations, presyncope or syncope. She's not having any chest pressure, neck or arm discomfort. She doesn't have any edema.  Allergies  Allergen Reactions  . Clopidogrel Bisulfate Other (See Comments)    REACTION: Sick, Headache, "Felt terrible"  . Codeine Other (See Comments)    REACTION: hallucinations  . Ace Inhibitors Other (See Comments)    cough  . Lisinopril Other (See Comments)    cough  . Penicillins Other (See Comments)    unknown    Current Outpatient Prescriptions  Medication Sig Dispense Refill  . albuterol (PROVENTIL HFA;VENTOLIN HFA) 108 (90 BASE) MCG/ACT inhaler Inhale 2 puffs into the lungs every 6 (six) hours as needed for wheezing or shortness of breath. 1 Inhaler 2  . albuterol (PROVENTIL) (2.5 MG/3ML) 0.083% nebulizer solution Take 3 mLs (2.5 mg total) by nebulization every 6 (six) hours as needed for wheezing or shortness of breath. 75 mL 12  . aspirin 81 MG EC tablet Take 81 mg by mouth daily.      [name] atenolol (TENORMIN) 50 MG tablet Take 1 tablet (50 mg total) by mouth daily. 60 tablet 5  . budesonide-formoterol (SYMBICORT) 160-4.5 MCG/ACT inhaler Inhale 2 puffs into the lungs 2 (two) times daily. 1 Inhaler 12  .  calcitRIOL (ROCALTROL) 0.25 MCG capsule Take 0.25 mcg by mouth daily.    . Carboxymethylcellulose Sodium (THERATEARS OP) Place 2 drops into both eyes as needed (for dry eyes).    . cholecalciferol (VITAMIN D) 1000 UNITS tablet Take 1,000 Units by mouth every morning.    . cloNIDine (CATAPRES) 0.1 MG tablet Take 0.1 mg by mouth 2 (two) times daily.    [name] docusate sodium (COLACE) 100 MG capsule Take 100 mg by mouth 2 (two) times daily.    [name] gabapentin (NEURONTIN) 300 MG capsule Take 1 capsule (300 mg total) by mouth at bedtime. DOSE DECREASED TO ONCE DAILY-AT BEDTIME BECAUSE OF YOUR KIDNEY FUNCTION    . hydrALAZINE (APRESOLINE) 50 MG tablet Take 2 tablets (100 mg total) by mouth 2 (two) times daily. New medicine for high blood pressure 60 tablet 3  . insulin lispro (HUMALOG) 100 UNIT/ML injection Inject 0.1-0.13 mLs (10-13 Units total) into the skin 3 (three) times daily with meals. To be administered as directed per sliding scale: 90-150= 10  Units 151-200=11 units 201-250=12 units 251-300= 13 units  To call MD if levels are between 251-300 for three consecutive days 10 mL 1  . LANTUS SOLOSTAR 100 UNIT/ML Solostar Pen INJECT 72 UNITS SUBCUTANEOUSLY AT 10PM. (Patient taking differently: 30 units at bedtime) 15 mL 3  . Linaclotide (LINZESS) 290 MCG CAPS capsule Take 1 capsule (290 mcg total) by mouth daily. 30 capsule 2  . NIFEdipine (  PROCARDIA XL/ADALAT-CC) 90 MG 24 hr tablet Take 90 mg by mouth every evening.     . nitroGLYCERIN (NITROSTAT) 0.4 MG SL tablet Place 1 tablet (0.4 mg total) under the tongue every 5 (five) minutes as needed for chest pain. 25 tablet 5  . pantoprazole (PROTONIX) 40 MG tablet Take 1 tablet (40 mg total) by mouth 2 (two) times daily. For gastric acid reflux. 60 tablet 3  . polyethylene glycol (MIRALAX / GLYCOLAX) packet Take 17 g by mouth daily.    . potassium chloride (K-DUR) 10 MEQ tablet Take 20 mEq by mouth every other day.     . rosuvastatin (CRESTOR) 20 MG tablet  TAKE 1 TABLET IN THE EVENING. 30 tablet 5  . torsemide (DEMADEX) 20 MG tablet Take 2 tablets (40 mg total) by mouth daily. New medicine to reduce fluid build up. (Patient taking differently: Take 40 mg by mouth daily. Take 20 mg extra tablet if weight is over 3-4 pounds.) 60 tablet 3  . glucose blood test strip Test 4 times daily 120 each 5   No current facility-administered medications for this visit.    Past Medical History  Diagnosis Date  . Esophageal stricture   . Hyperlipidemia   . Hypertension   . Bronchitis   . CAD (coronary artery disease)     s/p Stenting in 2005;  [place] 7/09: Vigorous LV function, 2-3+ MR, RI 40%, RI stent patent, proximal-mid RCA 40-50%;  [place] 7/09:  EF 55-65%, trivial MR;  [place] 11/18/12: EF 66%, normal LV wall motion, mild to moderate anterior ischemia - reviewed by [facility] and felt to rep breast atten (low risk);  E[place] 6/14: mild LVH, EF 55-65%, Gr 2 DD, PASP 44, trivial eff   . Arthritis   . IDDM (insulin dependent diabetes mellitus) ([place])   . Diverticulosis   . Diabetic neuropathy ([place])   . Vitamin D deficiency   . Degenerative joint disease     left shoulder  . Chronic neck pain   . Headache(784.0)   . Anemia     hx of  . Stroke [facility])     "mini- years ago"  . Chronic kidney disease     CREATININE IS UP--GOING TO KIDNEY MD   . CHF (congestive heart failure) ([place])   . Pericardial effusion 03/06/2015    Very small per echo ; pleural nodules seen on CT chest.  . Acute on chronic diastolic CHF (congestive heart failure) ([place]) 03/05/2015    Grade 2. EF 60-65%  . COPD (chronic obstructive pulmonary disease) ([place])   . Peripheral vascular disease [facility])     Past Surgical History  Procedure Laterality Date  . Bladder surgery      Tack  . Partial hysterectomy    . Shoulder surgery Right     Rotator cuff  . Cervical spine surgery  2012    8 screws  . Abdominal hysterectomy    . Laminectomy  12/29/2011  . Cardiac stents    . Lumbar  laminectomy/decompression microdiscectomy  12/29/2011    Procedure: LUMBAR LAMINECTOMY/DECOMPRESSION MICRODISCECTOMY;  Surgeon: Floyce Stakes, MD;  Location: [place]UR[facility] ORS;  Service: Neurosurgery;  Laterality: N/A;  Lumbar Two through Lumbar Five Laminectomies/Cellsaver  . Back surgery      2007 & 2013  . Cystoscopy with injection N/A 06/03/2013    Procedure: MACROPLASTIQUE  INJECTION ;  Surgeon: Malka So, MD;  Location: [facility]S;  Service: Urology;  Laterality: N/A;  . Cardiac catheterization  2003, 2005,  2009    1 stent  . Cataract extraction w/phaco Right 05/04/2014    Procedure: CATARACT EXTRACTION PHACO AND INTRAOCULAR LENS PLACEMENT (IOC);  Surgeon: Williams Che, MD;  Location: [facility];  Service: Ophthalmology;  Laterality: Right;  CDE 1.47  . Cataract extraction w/phaco Left 07/20/2014    Procedure: CATARACT EXTRACTION WITH PHACO AND INTRAOCULAR LENS PLACEMENT LEFT EYE CDE=1.79;  Surgeon: Williams Che, MD;  Location: [facility];  Service: Ophthalmology;  Laterality: Left;  . Av fistula placement Right 07/20/2015    Procedure: RADIAL CEPHALIC ARTERIOVENOUS FISTULA CREATION RIGHT ARM;  Surgeon: Angelia Mould, MD;  Location: [facility];  Service: Vascular;  Laterality: Right;    ROS:  Positive for insomnia. Otherwise as stated in the HPI and negative for all other systems.  PHYSICAL EXAM BP 152/66 mmHg  Pulse 68  Ht 5\' 3"  (1.6 m)  Wt 200 lb 6 oz (90.89 kg)  BMI 35.50 kg/m2 GENERAL:  Well appearing HEENT:  Pupils equal round and reactive, fundi not visualized, oral mucosa unremarkable NECK:  No jugular venous distention, waveform within normal limits, carotid upstroke brisk and symmetric, no bruits, no thyromegaly LYMPHATICS:  No cervical, inguinal adenopathy LUNGS:  Clear to auscultation bilaterally BACK:  No CVA tenderness CHEST:  Unremarkable HEART:  PMI not displaced or sustained,S1 and S2 within normal limits, no S3, no S4, no clicks, no rubs, no murmurs ABD:  Flat,  positive bowel sounds normal in frequency in pitch, no bruits, no rebound, no guarding, no midline pulsatile mass, no hepatomegaly, no splenomegaly EXT:  2 plus pulses upper, decreased dorsalis pedis and posterior tibialis bilaterally, mild bilateral lower extremity edema, no cyanosis no clubbing, right wirst thrill and bruit.  SKIN:  No rashes no nodules, keloids   ASSESSMENT AND PLAN   CAD -  I'm going to review the [facility] records.  If she had any evidence of ischemia she will at least need a [employer]..  However, I don't suspect ischemia as the etiology to her acute problems.  HYPERTENSION, BENIGN -  I will add Imdur as below  DYSLIPIDEMIA -   I will consider repeating a lipid profile at the next visit.  She was not at a target LDL in Feb  CHRONIC DIASTOLIC - We had a long discussion about this. We talked about when necessary dosing of her diuretic. I'm going to add Imdur 60 mg as I think worsening hypertension exacerbates her symptoms. We talked about taking nature glycerin should she start to have some shortness of breath. We again reviewed salt and fluid restriction. I think she may continue to have problems with volume until she is on dialysis.  ABNORMAL CT - The patient was found to have some small pleural nodes and was to have 3-6 month follow-up. I will look at the [facility] records to see if she had a repeat CT.   CKD - As above  [place] records reviewed.   No evidence of ischemia during her recent hospitalization.

## 2015-11-08 ENCOUNTER — Other Ambulatory Visit: Payer: Self-pay | Admitting: Family Medicine

## 2015-11-08 ENCOUNTER — Telehealth: Payer: Self-pay | Admitting: Cardiology

## 2015-11-08 ENCOUNTER — Encounter: Payer: Self-pay | Admitting: Pediatrics

## 2015-11-08 ENCOUNTER — Ambulatory Visit (INDEPENDENT_AMBULATORY_CARE_PROVIDER_SITE_OTHER): Payer: Medicare Other | Admitting: Pediatrics

## 2015-11-08 VITALS — BP 129/65 | HR 54 | Temp 97.0°F | Ht 63.0 in | Wt 201.8 lb

## 2015-11-08 DIAGNOSIS — J440 Chronic obstructive pulmonary disease with acute lower respiratory infection: Secondary | ICD-10-CM | POA: Diagnosis not present

## 2015-11-08 DIAGNOSIS — K5901 Slow transit constipation: Secondary | ICD-10-CM | POA: Diagnosis not present

## 2015-11-08 DIAGNOSIS — N184 Chronic kidney disease, stage 4 (severe): Secondary | ICD-10-CM | POA: Diagnosis not present

## 2015-11-08 DIAGNOSIS — E1121 Type 2 diabetes mellitus with diabetic nephropathy: Secondary | ICD-10-CM | POA: Diagnosis not present

## 2015-11-08 DIAGNOSIS — I5033 Acute on chronic diastolic (congestive) heart failure: Secondary | ICD-10-CM | POA: Diagnosis not present

## 2015-11-08 DIAGNOSIS — I10 Essential (primary) hypertension: Secondary | ICD-10-CM

## 2015-11-08 MED ORDER — LACTULOSE 10 GM/15ML PO SOLN
10.0000 g | Freq: Two times a day (BID) | ORAL | Status: DC | PRN
Start: 2015-11-08 — End: 2015-12-09

## 2015-11-08 MED ORDER — DOCUSATE SODIUM 100 MG PO CAPS: 100.0000 mg | ORAL_CAPSULE | Freq: Two times a day (BID) | ORAL | Status: DC

## 2015-11-08 MED ORDER — ALBUTEROL SULFATE HFA 108 (90 BASE) MCG/ACT IN AERS: 2.0000 | INHALATION_SPRAY | Freq: Four times a day (QID) | RESPIRATORY_TRACT | Status: DC | PRN

## 2015-11-08 MED ORDER — LINACLOTIDE 145 MCG PO CAPS: 145.0000 ug | ORAL_CAPSULE | Freq: Every day | ORAL | Status: DC

## 2015-11-08 NOTE — Telephone Encounter (Signed)
Received records from [facility] for appointment on 12/29/15 with Dr Percival Spanish.  Records given to [facility] (medical records) for Dr Hochrein's schedule on 12/29/15. lp

## 2015-11-08 NOTE — Progress Notes (Signed)
Subjective:    Patient ID: [name], female    DOB: [birth year], 64 y.o.   MRN: [id number]  CC: Follow-up multiple med problems and recent hospitalization  HPI: [name] is a 64 y.o. female presenting for Follow-up  Recent hospitalization for hypoxic resp failure and volume overload. Cr 4, also with CHF. Discharged 10/21/2015.  Constipation: taking docusate twice a day, hasnt been taking miralax every day. Two days ago last BM, was hard.  DM2: AM blood sugars 100s-150s on 30 u of lantus qhs. Two lows, 57 and 65, highs up to 180s. Says she knows when it will be high, can control with what she eats at 9 pm. Tends to have snack of fruit or ice cream then. Takes 10-11u lantus with meals, regularly with breakfast, not regularly throughouy the day due to BGLs in the 100s.  Weight is up two pounds today from yesterday. Planning to take extra torsemide dose this PM. Some swelling present in LE.  Breathing in general has been good since leaving hospital.  No chest pain.   Depression screen [facility] 2/9 11/08/2015 10/08/2015 09/23/2015 08/16/2015 06/22/2015  Decreased Interest 0 0 0 0 0  Down, Depressed, Hopeless 0 0 0 0 0  PHQ - 2 Score 0 0 0 0 0     Relevant past medical, surgical, family and social history reviewed and updated as indicated. Interim medical history since our last visit reviewed. Allergies and medications reviewed and updated.    ROS: Per HPI unless specifically indicated above  History  Smoking status  . Former Smoker -- 1.00 packs/day for 30 years  . Quit date: 12/04/2001  Smokeless tobacco  . Never Used    Past Medical History Patient Active Problem List   Diagnosis Date Noted  . Constipation 10/25/2015  . Pleural nodules 03/08/2015  . Bronchitis, chronic obstructive w acute bronchitis ([place]) 03/06/2015  . Chronic kidney disease, stage IV (severe) ([place]) 03/06/2015  . Morbid obesity ([place]) 03/06/2015  . Pericardial effusion 03/06/2015  . Acute on  chronic diastolic CHF (congestive heart failure) ([employer]) 03/05/2015  . PAD (peripheral artery disease) ([place]) 04/21/2014  . Leukocytosis 02/02/2014  . Hypokalemia 09/30/2013  . Dyspnea 09/29/2013  . Abnormal chest x-ray 09/29/2013  . Acute diastolic CHF (congestive heart failure) ([place]) 09/29/2013  . Type 2 diabetes mellitus with hypoglycemia ([place]) 01/02/2012  . CKD (chronic kidney disease) 01/02/2012  . Diverticulosis 03/29/2011  . Diabetic neuropathy ([place]) 03/29/2011  . Esophageal stricture 03/29/2011  . Vitamin D deficiency 03/29/2011  . Degenerative joint disease of left shoulder 03/29/2011  . Neck pain 03/29/2011  . Type II diabetes mellitus with nephropathy ([place]) 04/19/2009  . Hyperlipidemia 04/19/2009  . HYPERTENSION, BENIGN 04/19/2009  . Coronary atherosclerosis 04/19/2009    Current Outpatient Prescriptions  Medication Sig Dispense Refill  . albuterol (PROVENTIL HFA;VENTOLIN HFA) 108 (90 BASE) MCG/ACT inhaler Inhale 2 puffs into the lungs every 6 (six) hours as needed for wheezing or shortness of breath. 1 Inhaler 2  . albuterol (PROVENTIL) (2.5 MG/3ML) 0.083% nebulizer solution Take 3 mLs (2.5 mg total) by nebulization every 6 (six) hours as needed for wheezing or shortness of breath. 75 mL 12  . aspirin 81 MG EC tablet Take 81 mg by mouth daily.      [name] atenolol (TENORMIN) 50 MG tablet Take 1 tablet (50 mg total) by mouth daily. 60 tablet 5  . budesonide-formoterol (SYMBICORT) 160-4.5 MCG/ACT inhaler Inhale 2 puffs into the lungs 2 (two) times daily. 1  Inhaler 12  . calcitRIOL (ROCALTROL) 0.25 MCG capsule Take 0.25 mcg by mouth daily.    . Carboxymethylcellulose Sodium (THERATEARS OP) Place 2 drops into both eyes as needed (for dry eyes).    . cholecalciferol (VITAMIN D) 1000 UNITS tablet Take 1,000 Units by mouth every morning.    . cloNIDine (CATAPRES) 0.1 MG tablet Take 0.1 mg by mouth 2 (two) times daily.    [name] docusate sodium (COLACE) 100 MG capsule Take 1 capsule (100 mg  total) by mouth 2 (two) times daily. 60 capsule 4  . gabapentin (NEURONTIN) 300 MG capsule Take 1 capsule (300 mg total) by mouth at bedtime. DOSE DECREASED TO ONCE DAILY-AT BEDTIME BECAUSE OF YOUR KIDNEY FUNCTION    . glucose blood test strip Test 4 times daily 120 each 5  . hydrALAZINE (APRESOLINE) 50 MG tablet Take 2 tablets (100 mg total) by mouth 2 (two) times daily. New medicine for high blood pressure 60 tablet 3  . insulin lispro (HUMALOG) 100 UNIT/ML injection Inject 0.1-0.13 mLs (10-13 Units total) into the skin 3 (three) times daily with meals. To be administered as directed per sliding scale: 90-150= 10  Units 151-200=11 units 201-250=12 units 251-300= 13 units  To call MD if levels are between 251-300 for three consecutive days 10 mL 1  . isosorbide mononitrate (IMDUR) 60 MG 24 hr tablet Take 1 tablet (60 mg total) by mouth daily. 30 tablet 9  . LANTUS SOLOSTAR 100 UNIT/ML Solostar Pen INJECT 72 UNITS SUBCUTANEOUSLY AT 10PM. (Patient taking differently: 30 units at bedtime) 15 mL 3  . Linaclotide (LINZESS) 145 MCG CAPS capsule Take 1 capsule (145 mcg total) by mouth daily. 30 capsule 2  . LORazepam (ATIVAN) 0.5 MG tablet Take 1 tablet (0.5 mg total) by mouth every 8 (eight) hours. 30 tablet 2  . NIFEdipine (PROCARDIA XL/ADALAT-CC) 90 MG 24 hr tablet Take 90 mg by mouth every evening.     . nitroGLYCERIN (NITROSTAT) 0.4 MG SL tablet Place 1 tablet (0.4 mg total) under the tongue every 5 (five) minutes as needed for chest pain. 25 tablet 5  . pantoprazole (PROTONIX) 40 MG tablet Take 1 tablet (40 mg total) by mouth 2 (two) times daily. For gastric acid reflux. 60 tablet 3  . polyethylene glycol (MIRALAX / GLYCOLAX) packet Take 17 g by mouth daily.    . potassium chloride (K-DUR) 10 MEQ tablet Take 20 mEq by mouth every other day.     . rosuvastatin (CRESTOR) 20 MG tablet TAKE 1 TABLET IN THE EVENING. 30 tablet 5  . torsemide (DEMADEX) 20 MG tablet Take 2 tablets (40 mg total) by mouth  daily. New medicine to reduce fluid build up. (Patient taking differently: Take 40 mg by mouth daily. Take 20 mg extra tablet if weight is over 3-4 pounds.) 60 tablet 3  . lactulose (CHRONULAC) 10 GM/15ML solution Take 15 mLs (10 g total) by mouth 2 (two) times daily as needed for mild constipation. 240 mL 0   No current facility-administered medications for this visit.       Objective:    BP 129/65 mmHg  Pulse 54  Temp(Src) 97 F (36.1 C) (Oral)  Ht 5\' 3"  (1.6 m)  Wt 201 lb 12.8 oz (91.536 kg)  BMI 35.76 kg/m2  Wt Readings from Last 3 Encounters:  11/08/15 201 lb 12.8 oz (91.536 kg)  11/05/15 200 lb 6 oz (90.89 kg)  11/03/15 200 lb (90.719 kg)    Gen: NAD, alert, cooperative with exam,  NCAT EYES: EOMI, no scleral injection or icterus ENT:   OP without erythema LYMPH: no cervical LAD CV: NRRR, normal S1/S2,  distal pulses 2+ b/l Resp: CTABL, no wheezes, normal WOB Abd: +BS, soft, NTND. no guarding or organomegaly Ext: 1+ pitting edema at ankles b/l. Warm. Palpable thrill to RUE forearm fistula. Neuro: Alert and oriented, strength equal b/l UE and LE, coordination grossly normal MSK: normal muscle bulk     Assessment & Plan:    Kenisha was seen today for follow-up recent hospitalization and multipl emed problems.  Diagnoses and all orders for this visit:  HYPERTENSION, BENIGN Well controlled today. Continue current medicines.  Slow transit constipation Start linzess as below. Has failed multiple OTC medicines including docusate, miralax, OTC stool softeners. See pt instructions. Not able to take miralax anymore due to daily fluid intake restrictions. -     docusate sodium (COLACE) 100 MG capsule; Take 1 capsule (100 mg total) by mouth 2 (two) times daily. -     lactulose (CHRONULAC) 10 GM/15ML solution; Take 15 mLs (10 g total) by mouth 2 (two) times daily as needed for mild constipation. -     Linaclotide (LINZESS) 145 MCG CAPS capsule; Take 1 capsule (145 mcg total) by  mouth daily.  Bronchitis, chronic obstructive w acute bronchitis (HCC) -     Discontinue: albuterol (PROVENTIL HFA;VENTOLIN HFA) 108 (90 BASE) MCG/ACT inhaler; Inhale 2 puffs into the lungs every 6 (six) hours as needed for wheezing or shortness of breath. -     albuterol (PROVENTIL HFA;VENTOLIN HFA) 108 (90 BASE) MCG/ACT inhaler; Inhale 2 puffs into the lungs every 6 (six) hours as needed for wheezing or shortness of breath.  Acute on chronic diastolic CHF (congestive heart failure) (HCC) Weighing self daily, is up two pounds this morning from yesterday, slight amount of swelling at ankles, normal lung exam. Plannign to take extra torsemide this evening. Doesn't want labs drawn today as they will be drawn this afternoon for nephrology. She will ask them to send results to [place].  Type II diabetes mellitus with nephropathy (HCC) Decrease meal time humalin insulin to 5u. Continue 30u of lantus with goal AM BGLs 100-150, if having more lows will decrease to 25u qhs.  Chronic kidney disease, stage IV (severe) (HCC) Has appt with nephrology in two days.     Follow up plan: Return in about 1 month (around 12/09/2015).  [name], MD [facility] 11/08/2015, 8:34 AM

## 2015-11-08 NOTE — Patient Instructions (Addendum)
Lantus 30 u at night  Humalin 5 u before meals   Try linzess tabs for constipation. If still constipated can take Lactulose for constipation. We can also increase your dose of linzess if insurance will pay for it.

## 2015-11-08 NOTE — Telephone Encounter (Signed)
Received Release signed by patient to obtain records from [facility] per request of Dr Percival Spanish.  Faxed release to [facility] on 11/08/15.

## 2015-11-12 LAB — HM MAMMOGRAPHY: HM Mammogram: NEGATIVE

## 2015-11-13 ENCOUNTER — Emergency Department (HOSPITAL_COMMUNITY): Payer: Medicare Other

## 2015-11-13 ENCOUNTER — Emergency Department (HOSPITAL_COMMUNITY)
Admission: EM | Admit: 2015-11-13 | Discharge: 2015-11-13 | Disposition: A | Discharge status: Home / Self Care | Payer: Medicare Other | Attending: Emergency Medicine | Admitting: Emergency Medicine

## 2015-11-13 ENCOUNTER — Encounter (HOSPITAL_COMMUNITY): Payer: Self-pay | Admitting: Emergency Medicine

## 2015-11-13 DIAGNOSIS — Z7982 Long term (current) use of aspirin: Secondary | ICD-10-CM | POA: Insufficient documentation

## 2015-11-13 DIAGNOSIS — I5033 Acute on chronic diastolic (congestive) heart failure: Secondary | ICD-10-CM | POA: Diagnosis not present

## 2015-11-13 DIAGNOSIS — Z79899 Other long term (current) drug therapy: Secondary | ICD-10-CM | POA: Insufficient documentation

## 2015-11-13 DIAGNOSIS — Z794 Long term (current) use of insulin: Secondary | ICD-10-CM | POA: Insufficient documentation

## 2015-11-13 DIAGNOSIS — I2510 Atherosclerotic heart disease of native coronary artery without angina pectoris: Secondary | ICD-10-CM | POA: Diagnosis not present

## 2015-11-13 DIAGNOSIS — Z7951 Long term (current) use of inhaled steroids: Secondary | ICD-10-CM | POA: Diagnosis not present

## 2015-11-13 DIAGNOSIS — R05 Cough: Secondary | ICD-10-CM | POA: Diagnosis not present

## 2015-11-13 DIAGNOSIS — R0600 Dyspnea, unspecified: Secondary | ICD-10-CM | POA: Insufficient documentation

## 2015-11-13 DIAGNOSIS — N189 Chronic kidney disease, unspecified: Secondary | ICD-10-CM | POA: Diagnosis not present

## 2015-11-13 DIAGNOSIS — E785 Hyperlipidemia, unspecified: Secondary | ICD-10-CM | POA: Insufficient documentation

## 2015-11-13 DIAGNOSIS — Z87891 Personal history of nicotine dependence: Secondary | ICD-10-CM | POA: Insufficient documentation

## 2015-11-13 DIAGNOSIS — E119 Type 2 diabetes mellitus without complications: Secondary | ICD-10-CM | POA: Insufficient documentation

## 2015-11-13 DIAGNOSIS — R0602 Shortness of breath: Secondary | ICD-10-CM | POA: Diagnosis present

## 2015-11-13 DIAGNOSIS — J449 Chronic obstructive pulmonary disease, unspecified: Secondary | ICD-10-CM | POA: Insufficient documentation

## 2015-11-13 DIAGNOSIS — M1990 Unspecified osteoarthritis, unspecified site: Secondary | ICD-10-CM | POA: Insufficient documentation

## 2015-11-13 DIAGNOSIS — Z880 Allergy status to penicillin: Secondary | ICD-10-CM | POA: Diagnosis not present

## 2015-11-13 DIAGNOSIS — Z8673 Personal history of transient ischemic attack (TIA), and cerebral infarction without residual deficits: Secondary | ICD-10-CM | POA: Insufficient documentation

## 2015-11-13 DIAGNOSIS — J440 Chronic obstructive pulmonary disease with acute lower respiratory infection: Secondary | ICD-10-CM

## 2015-11-13 DIAGNOSIS — G8929 Other chronic pain: Secondary | ICD-10-CM | POA: Diagnosis not present

## 2015-11-13 DIAGNOSIS — I129 Hypertensive chronic kidney disease with stage 1 through stage 4 chronic kidney disease, or unspecified chronic kidney disease: Secondary | ICD-10-CM | POA: Insufficient documentation

## 2015-11-13 LAB — COMPREHENSIVE METABOLIC PANEL
ALT: 14 U/L (ref 14–54)
AST: 15 U/L (ref 15–41)
Albumin: 3.6 g/dL (ref 3.5–5.0)
Alkaline Phosphatase: 79 U/L (ref 38–126)
Anion gap: 12 (ref 5–15)
BUN: 70 mg/dL — ABNORMAL HIGH (ref 6–20)
CO2: 24 mmol/L (ref 22–32)
Calcium: 9 mg/dL (ref 8.9–10.3)
Chloride: 104 mmol/L (ref 101–111)
Creatinine, Ser: 5.84 mg/dL — ABNORMAL HIGH (ref 0.44–1.00)
GFR calc Af Amer: 8 mL/min — ABNORMAL LOW (ref >60)
GFR calc non Af Amer: 7 mL/min — ABNORMAL LOW (ref >60)
Glucose, Bld: 124 mg/dL — ABNORMAL HIGH (ref 65–99)
Potassium: 4.1 mmol/L (ref 3.5–5.1)
Sodium: 140 mmol/L (ref 135–145)
Total Bilirubin: 0.6 mg/dL (ref 0.3–1.2)
Total Protein: 6.3 g/dL — ABNORMAL LOW (ref 6.5–8.1)

## 2015-11-13 LAB — CBC WITH DIFFERENTIAL/PLATELET
Basophils Absolute: 0 10*3/uL (ref 0.0–0.1)
Basophils Relative: 0 %
Eosinophils Absolute: 0.6 10*3/uL (ref 0.0–0.7)
Eosinophils Relative: 6 %
HCT: 31.2 % — ABNORMAL LOW (ref 36.0–46.0)
Hemoglobin: 10.2 g/dL — ABNORMAL LOW (ref 12.0–15.0)
Lymphocytes Relative: 33 %
Lymphs Abs: 3.1 10*3/uL (ref 0.7–4.0)
MCH: 26.6 pg (ref 26.0–34.0)
MCHC: 32.7 g/dL (ref 30.0–36.0)
MCV: 81.3 fL (ref 78.0–100.0)
Monocytes Absolute: 0.5 10*3/uL (ref 0.1–1.0)
Monocytes Relative: 5 %
Neutro Abs: 5.1 10*3/uL (ref 1.7–7.7)
Neutrophils Relative %: 56 %
Platelets: 245 10*3/uL (ref 150–400)
RBC: 3.84 MIL/uL — ABNORMAL LOW (ref 3.87–5.11)
RDW: 16.1 % — ABNORMAL HIGH (ref 11.5–15.5)
WBC: 9.2 10*3/uL (ref 4.0–10.5)

## 2015-11-13 LAB — BRAIN NATRIURETIC PEPTIDE: B Natriuretic Peptide: 396 pg/mL — ABNORMAL HIGH (ref 0.0–100.0)

## 2015-11-13 MED ORDER — PREDNISONE 20 MG PO TABS
40.0000 mg | ORAL_TABLET | Freq: Every day | ORAL | Status: DC
Start: 2015-11-14 — End: 2015-11-13

## 2015-11-13 MED ORDER — PREDNISONE 20 MG PO TABS: 40.0000 mg | ORAL_TABLET | Freq: Every day | ORAL | Status: DC

## 2015-11-13 MED ORDER — ALBUTEROL SULFATE (2.5 MG/3ML) 0.083% IN NEBU: 2.5000 mg | INHALATION_SOLUTION | Freq: Four times a day (QID) | RESPIRATORY_TRACT | Status: DC | PRN

## 2015-11-13 MED ORDER — PREDNISONE 50 MG PO TABS
60.0000 mg | ORAL_TABLET | Freq: Once | ORAL | Status: AC
  Administered 2015-11-13: 60 mg via ORAL
  Filled 2015-11-13: qty 1

## 2015-11-13 MED ORDER — ALBUTEROL SULFATE HFA 108 (90 BASE) MCG/ACT IN AERS
2.0000 | INHALATION_SPRAY | Freq: Once | RESPIRATORY_TRACT | Status: AC
  Administered 2015-11-13: 2 via RESPIRATORY_TRACT
  Filled 2015-11-13: qty 6.7

## 2015-11-13 MED ORDER — ALBUTEROL SULFATE HFA 108 (90 BASE) MCG/ACT IN AERS: 2.0000 | INHALATION_SPRAY | Freq: Four times a day (QID) | RESPIRATORY_TRACT | Status: DC | PRN

## 2015-11-13 NOTE — ED Notes (Signed)
PT c/o SOB on exertion worsening today and states hx of CHF. PT denies any other complaints.

## 2015-11-13 NOTE — ED Notes (Signed)
MD at bedside. 

## 2015-11-13 NOTE — ED Provider Notes (Signed)
CSN: [id number]     Arrival date & time 11/13/15  1423 History   First MD Initiated Contact with Patient 11/13/15 1635     Chief Complaint  Patient presents with  . Shortness of Breath     (Consider location/radiation/quality/duration/timing/severity/associated sxs/prior Treatment) Patient is a 64 y.o. female presenting with shortness of breath.  Shortness of Breath Severity:  Mild Onset quality:  Gradual Duration:  2 days Timing:  Constant Progression:  Worsening Chronicity:  Recurrent Relieved by:  None tried Worsened by:  Nothing tried Ineffective treatments:  None tried Associated symptoms: cough   Associated symptoms: no abdominal pain, no chest pain, no fever, no headaches and no hemoptysis     Past Medical History  Diagnosis Date  . Esophageal stricture   . Hyperlipidemia   . Hypertension   . Bronchitis   . CAD (coronary artery disease)     s/p Stenting in 2005;  [place] 7/09: Vigorous LV function, 2-3+ MR, RI 40%, RI stent patent, proximal-mid RCA 40-50%;  [place] 7/09:  EF 55-65%, trivial MR;  [place] 11/18/12: EF 66%, normal LV wall motion, mild to moderate anterior ischemia - reviewed by [facility] and felt to rep breast atten (low risk);  Echo 6/14: mild LVH, EF 55-65%, Gr 2 DD, PASP 44, trivial eff   . Arthritis   . IDDM (insulin dependent diabetes mellitus) ([place])   . Diverticulosis   . Diabetic neuropathy ([place])   . Vitamin D deficiency   . Degenerative joint disease     left shoulder  . Chronic neck pain   . Headache(784.0)   . Anemia     hx of  . Stroke [facility])     "mini- years ago"  . Chronic kidney disease     CREATININE IS UP--GOING TO KIDNEY MD   . CHF (congestive heart failure) ([place])   . Pericardial effusion 03/06/2015    Very small per echo ; pleural nodules seen on CT chest.  . Acute on chronic diastolic CHF (congestive heart failure) ([place]) 03/05/2015    Grade 2. EF 60-65%  . COPD (chronic obstructive pulmonary disease) ([place])   . Peripheral vascular disease  [facility])    Past Surgical History  Procedure Laterality Date  . Bladder surgery      Tack  . Partial hysterectomy    . Shoulder surgery Right     Rotator cuff  . Cervical spine surgery  2012    8 screws  . Abdominal hysterectomy    . Laminectomy  12/29/2011  . Cardiac stents    . Lumbar laminectomy/decompression microdiscectomy  12/29/2011    Procedure: LUMBAR LAMINECTOMY/DECOMPRESSION MICRODISCECTOMY;  Surgeon: Floyce Stakes, MD;  Location: [place] NEURO ORS;  Service: Neurosurgery;  Laterality: N/A;  Lumbar Two through Lumbar Five Laminectomies/Cellsaver  . Back surgery      2007 & 2013  . Cystoscopy with injection N/A 06/03/2013    Procedure: MACROPLASTIQUE  INJECTION ;  Surgeon: Malka So, MD;  Location: [facility] ORS;  Service: Urology;  Laterality: N/A;  . Cardiac catheterization  2003, 2005, 2009    1 stent  . Cataract extraction w/phaco Right 05/04/2014    Procedure: CATARACT EXTRACTION PHACO AND INTRAOCULAR LENS PLACEMENT (IOC);  Surgeon: Williams Che, MD;  Location: [facility] ORS;  Service: Ophthalmology;  Laterality: Right;  CDE 1.47  . Cataract extraction w/phaco Left 07/20/2014    Procedure: CATARACT EXTRACTION WITH PHACO AND INTRAOCULAR LENS PLACEMENT LEFT EYE CDE=1.79;  Surgeon: Williams Che, MD;  Location: [facility]  ORS;  Service: Ophthalmology;  Laterality: Left;  . Av fistula placement Right 07/20/2015    Procedure: RADIAL CEPHALIC ARTERIOVENOUS FISTULA CREATION RIGHT ARM;  Surgeon: Angelia Mould, MD;  Location: [facility];  Service: Vascular;  Laterality: Right;   Family History  Problem Relation Age of Onset  . Heart disease Other   . Hypertension Father   . Cancer Father   . Hypertension Daughter    Social History  Substance Use Topics  . Smoking status: Former Smoker -- 1.00 packs/day for 30 years    Quit date: 12/04/2001  . Smokeless tobacco: Never Used  . Alcohol Use: 3.6 oz/week    6 Cans of beer per week     Comment: occasional   OB History    No data available      Review of Systems  Constitutional: Negative for fever, chills and fatigue.  Eyes: Negative for pain.  Respiratory: Positive for cough and shortness of breath. Negative for hemoptysis.   Cardiovascular: Negative for chest pain.  Gastrointestinal: Negative for abdominal pain.  Endocrine: Negative for polyuria.  Neurological: Negative for headaches.  All other systems reviewed and are negative.     Allergies  Clopidogrel bisulfate; Codeine; Ace inhibitors; Lisinopril; and Penicillins  Home Medications   Prior to Admission medications   Medication Sig Start Date End Date Taking? Authorizing Provider  aspirin 81 MG EC tablet Take 81 mg by mouth daily.     Yes Historical Provider, MD  atenolol (TENORMIN) 50 MG tablet Take 1 tablet (50 mg total) by mouth daily. 09/26/15  Yes Eustaquio Maize, MD  budesonide-formoterol [facility]) 160-4.5 MCG/ACT inhaler Inhale 2 puffs into the lungs 2 (two) times daily. 09/26/15  Yes Eustaquio Maize, MD  calcitRIOL (ROCALTROL) 0.25 MCG capsule Take 0.25 mcg by mouth daily.   Yes Historical Provider, MD  Carboxymethylcellulose Sodium (THERATEARS OP) Place 2 drops into both eyes as needed (for dry eyes).   Yes Historical Provider, MD  cloNIDine (CATAPRES) 0.1 MG tablet Take 0.1 mg by mouth 2 (two) times daily.   Yes Historical Provider, MD  docusate sodium (COLACE) 100 MG capsule Take 1 capsule (100 mg total) by mouth 2 (two) times daily. 11/08/15  Yes Eustaquio Maize, MD  fluticasone (FLONASE) 50 MCG/ACT nasal spray Place 2 sprays into both nostrils daily. 11/08/15  Yes Historical Provider, MD  gabapentin (NEURONTIN) 300 MG capsule Take 1 capsule (300 mg total) by mouth at bedtime. DOSE DECREASED TO ONCE DAILY-AT BEDTIME BECAUSE OF YOUR KIDNEY FUNCTION Patient taking differently: Take 300 mg by mouth at bedtime.  03/09/15  Yes Rexene Alberts, MD  hydrALAZINE (APRESOLINE) 50 MG tablet Take 2 tablets (100 mg total) by mouth 2 (two) times daily. New medicine for  high blood pressure Patient taking differently: Take 50 mg by mouth 2 (two) times daily.  09/26/15  Yes Eustaquio Maize, MD  insulin lispro (HUMALOG) 100 UNIT/ML injection Inject 0.1-0.13 mLs (10-13 Units total) into the skin 3 (three) times daily with meals. To be administered as directed per sliding scale: 90-150= 10  Units 151-200=11 units 201-250=12 units 251-300= 13 units  To call MD if levels are between 251-300 for three consecutive days Patient taking differently: Inject 5 Units into the skin 3 (three) times daily with meals. To be administered as directed plus sliding scale: 90-150= 10  Units 151-200=11 units 201-250=12 units 251-300= 13 units  To call MD if levels are between 251-300 for three consecutive days 12/03/14  Yes Lillette Boxer  Sabra Heck, MD  isosorbide mononitrate (IMDUR) 60 MG 24 hr tablet Take 1 tablet (60 mg total) by mouth daily. 11/05/15  Yes Minus Breeding, MD  lactulose (CHRONULAC) 10 GM/15ML solution Take 15 mLs (10 g total) by mouth 2 (two) times daily as needed for mild constipation. 11/08/15  Yes Eustaquio Maize, MD  LANTUS SOLOSTAR 100 UNIT/ML Solostar Pen INJECT 72 UNITS SUBCUTANEOUSLY AT 10PM. Patient taking differently: 30 units at bedtime 09/07/15  Yes Wardell Honour, MD  Linaclotide [facility]) 145 MCG CAPS capsule Take 1 capsule (145 mcg total) by mouth daily. 11/08/15  Yes Eustaquio Maize, MD  LORazepam (ATIVAN) 0.5 MG tablet Take 1 tablet (0.5 mg total) by mouth every 8 (eight) hours. 11/05/15  Yes Minus Breeding, MD  NIFEdipine (PROCARDIA XL/ADALAT-CC) 90 MG 24 hr tablet Take 90 mg by mouth every evening.    Yes Historical Provider, MD  nitroGLYCERIN (NITROSTAT) 0.4 MG SL tablet Place 1 tablet (0.4 mg total) under the tongue every 5 (five) minutes as needed for chest pain. 05/25/15  Yes Minus Breeding, MD  pantoprazole (PROTONIX) 40 MG tablet Take 1 tablet (40 mg total) by mouth 2 (two) times daily. For gastric acid reflux. 10/25/15  Yes [name], MD   potassium chloride (K-DUR) 10 MEQ tablet Take 10 mEq by mouth daily.  04/17/14  Yes Historical Provider, MD  rosuvastatin (CRESTOR) 20 MG tablet TAKE 1 TABLET IN THE EVENING. 09/28/15  Yes Eustaquio Maize, MD  torsemide (DEMADEX) 20 MG tablet Take 2 tablets (40 mg total) by mouth daily. New medicine to reduce fluid build up. Patient taking differently: Take 40-60 mg by mouth daily. **Take 20 mg extra tablet (60mg  total) if weight is over 3-4 pounds. 03/09/15  Yes Rexene Alberts, MD  albuterol (PROVENTIL HFA;VENTOLIN HFA) 108 (90 BASE) MCG/ACT inhaler Inhale 2 puffs into the lungs every 6 (six) hours as needed for wheezing or shortness of breath. 11/13/15   Merrily Pew, MD  albuterol (PROVENTIL) (2.5 MG/3ML) 0.083% nebulizer solution Take 3 mLs (2.5 mg total) by nebulization every 6 (six) hours as needed for wheezing or shortness of breath. 11/13/15   Merrily Pew, MD  glucose blood test strip Test 4 times daily 10/08/15   Eustaquio Maize, MD  polyethylene glycol [facility] Flor[facility] Raveling) packet Take 17 g by mouth daily as needed for mild constipation or moderate constipation.     Historical Provider, MD  predniSONE (DELTASONE) 20 MG tablet Take 2 tablets (40 mg total) by mouth daily with breakfast. 11/14/15   Merrily Pew, MD   BP 172/80 mmHg  Pulse 66  Temp(Src) 98 F (36.7 C) (Oral)  Resp 18  Ht 5\' 3"  (1.6 m)  Wt 196 lb (88.905 kg)  BMI 34.73 kg/m2  SpO2 99% Physical Exam  Constitutional: She is oriented to person, place, and time. She appears well-developed and well-nourished.  HENT:  Head: Normocephalic and atraumatic.  Neck: Normal range of motion.  Cardiovascular: Normal rate and regular rhythm.   Pulmonary/Chest: Effort normal and breath sounds normal. No stridor. No respiratory distress. She has no wheezes. She has no rales.  Abdominal: Soft. She exhibits no distension. There is no tenderness.  Neurological: She is alert and oriented to person, place, and time.  Skin: Skin is warm and  dry.  Nursing note and vitals reviewed.   ED Course  Procedures (including critical care time) Labs Review Labs Reviewed  BRAIN NATRIURETIC PEPTIDE - Abnormal; Notable for the following:    B Natriuretic Peptide  396.0 (*)    All other components within normal limits  CBC WITH DIFFERENTIAL/PLATELET - Abnormal; Notable for the following:    RBC 3.84 (*)    Hemoglobin 10.2 (*)    HCT 31.2 (*)    RDW 16.1 (*)    All other components within normal limits  COMPREHENSIVE METABOLIC PANEL - Abnormal; Notable for the following:    Glucose, Bld 124 (*)    BUN 70 (*)    Creatinine, Ser 5.84 (*)    Total Protein 6.3 (*)    GFR calc non Af Amer 7 (*)    GFR calc Af Amer 8 (*)    All other components within normal limits    Imaging Review Dg Chest 2 View  11/13/2015  CLINICAL DATA:  Shortness of breath. EXAM: CHEST  2 VIEW COMPARISON:  October 17, 2015. FINDINGS: Stable cardiomegaly. No pneumothorax or pleural effusion is noted. No acute pulmonary disease is noted. Bony thorax intact. IMPRESSION: No active cardiopulmonary disease. Electronically Signed   By: Marijo Conception, M.D.   On: 11/13/2015 15:24   I have personally reviewed and evaluated these images and lab results as part of my medical decision-making.   EKG Interpretation None      MDM   Final diagnoses:  Dyspnea   Dyspnea, no distress or objective findings of failure. Improved with duo neb and steroids. bnp not much above normal. No other e/o fluid overload. Elevated creatinine, still making urine, d/w medicien and without fluid overload, hyperkalemia, uremic symptoms she did not need to be admitted for emergent dialysis. Patient will follow up with nephrologist on Monday, decrease lasix in mean time, return here for new/worsening symptoms.      Merrily Pew, MD 11/14/15 [phone]

## 2015-11-16 ENCOUNTER — Ambulatory Visit: Payer: Medicare Other | Admitting: Gastroenterology

## 2015-11-22 ENCOUNTER — Encounter (HOSPITAL_COMMUNITY): Payer: Self-pay | Admitting: Vascular Surgery

## 2015-11-22 ENCOUNTER — Encounter (HOSPITAL_COMMUNITY)
Admission: RE | Disposition: A | Discharge status: Home / Self Care | Payer: Self-pay | Source: Ambulatory Visit | Attending: Vascular Surgery

## 2015-11-22 ENCOUNTER — Ambulatory Visit (HOSPITAL_COMMUNITY)
Admission: RE | Admit: 2015-11-22 | Discharge: 2015-11-22 | Disposition: A | Discharge status: Home / Self Care | Payer: Medicare Other | Source: Ambulatory Visit | Attending: Vascular Surgery | Admitting: Vascular Surgery

## 2015-11-22 DIAGNOSIS — Z87891 Personal history of nicotine dependence: Secondary | ICD-10-CM | POA: Insufficient documentation

## 2015-11-22 DIAGNOSIS — Z880 Allergy status to penicillin: Secondary | ICD-10-CM | POA: Diagnosis not present

## 2015-11-22 DIAGNOSIS — E1122 Type 2 diabetes mellitus with diabetic chronic kidney disease: Secondary | ICD-10-CM | POA: Diagnosis not present

## 2015-11-22 DIAGNOSIS — E559 Vitamin D deficiency, unspecified: Secondary | ICD-10-CM | POA: Diagnosis not present

## 2015-11-22 DIAGNOSIS — Z4901 Encounter for fitting and adjustment of extracorporeal dialysis catheter: Secondary | ICD-10-CM | POA: Diagnosis not present

## 2015-11-22 DIAGNOSIS — Z992 Dependence on renal dialysis: Secondary | ICD-10-CM | POA: Insufficient documentation

## 2015-11-22 DIAGNOSIS — Z955 Presence of coronary angioplasty implant and graft: Secondary | ICD-10-CM | POA: Diagnosis not present

## 2015-11-22 DIAGNOSIS — I2510 Atherosclerotic heart disease of native coronary artery without angina pectoris: Secondary | ICD-10-CM | POA: Insufficient documentation

## 2015-11-22 DIAGNOSIS — M1990 Unspecified osteoarthritis, unspecified site: Secondary | ICD-10-CM | POA: Diagnosis not present

## 2015-11-22 DIAGNOSIS — I132 Hypertensive heart and chronic kidney disease with heart failure and with stage 5 chronic kidney disease, or end stage renal disease: Secondary | ICD-10-CM | POA: Insufficient documentation

## 2015-11-22 DIAGNOSIS — E1140 Type 2 diabetes mellitus with diabetic neuropathy, unspecified: Secondary | ICD-10-CM | POA: Diagnosis not present

## 2015-11-22 DIAGNOSIS — N186 End stage renal disease: Secondary | ICD-10-CM | POA: Insufficient documentation

## 2015-11-22 DIAGNOSIS — E1151 Type 2 diabetes mellitus with diabetic peripheral angiopathy without gangrene: Secondary | ICD-10-CM | POA: Diagnosis not present

## 2015-11-22 DIAGNOSIS — J449 Chronic obstructive pulmonary disease, unspecified: Secondary | ICD-10-CM | POA: Diagnosis not present

## 2015-11-22 DIAGNOSIS — Z8673 Personal history of transient ischemic attack (TIA), and cerebral infarction without residual deficits: Secondary | ICD-10-CM | POA: Diagnosis not present

## 2015-11-22 DIAGNOSIS — T82898A Other specified complication of vascular prosthetic devices, implants and grafts, initial encounter: Secondary | ICD-10-CM | POA: Diagnosis not present

## 2015-11-22 DIAGNOSIS — Z7982 Long term (current) use of aspirin: Secondary | ICD-10-CM | POA: Insufficient documentation

## 2015-11-22 DIAGNOSIS — E785 Hyperlipidemia, unspecified: Secondary | ICD-10-CM | POA: Diagnosis not present

## 2015-11-22 DIAGNOSIS — I5032 Chronic diastolic (congestive) heart failure: Secondary | ICD-10-CM | POA: Diagnosis not present

## 2015-11-22 DIAGNOSIS — Z794 Long term (current) use of insulin: Secondary | ICD-10-CM | POA: Diagnosis not present

## 2015-11-22 HISTORY — PX: PERIPHERAL VASCULAR CATHETERIZATION: SHX172C

## 2015-11-22 LAB — POCT I-STAT, CHEM 8
BUN: 115 mg/dL — ABNORMAL HIGH (ref 6–20)
Calcium, Ion: 1.1 mmol/L — ABNORMAL LOW (ref 1.13–1.30)
Chloride: 106 mmol/L (ref 101–111)
Creatinine, Ser: 7.1 mg/dL — ABNORMAL HIGH (ref 0.44–1.00)
Glucose, Bld: 149 mg/dL — ABNORMAL HIGH (ref 65–99)
HCT: 34 % — ABNORMAL LOW (ref 36.0–46.0)
Hemoglobin: 11.6 g/dL — ABNORMAL LOW (ref 12.0–15.0)
Potassium: 4 mmol/L (ref 3.5–5.1)
Sodium: 142 mmol/L (ref 135–145)
TCO2: 23 mmol/L (ref 0–100)

## 2015-11-22 SURGERY — A/V SHUNTOGRAM/FISTULAGRAM
Anesthesia: LOCAL

## 2015-11-22 MED ORDER — LIDOCAINE HCL (PF) 1 % IJ SOLN
INTRAMUSCULAR | Status: AC
  Filled 2015-11-22: qty 30

## 2015-11-22 MED ORDER — SODIUM CHLORIDE 0.9 % IJ SOLN: 3.0000 mL | INTRAMUSCULAR | Status: DC | PRN

## 2015-11-22 MED ORDER — LIDOCAINE HCL (PF) 1 % IJ SOLN
INTRAMUSCULAR | Status: DC | PRN
  Administered 2015-11-22: 08:00:00

## 2015-11-22 MED ORDER — IODIXANOL 320 MG/ML IV SOLN
INTRAVENOUS | Status: DC | PRN
  Administered 2015-11-22: 15 mL via INTRAVENOUS

## 2015-11-22 SURGICAL SUPPLY — 12 items
BAG SNAP BAND KOVER 36X36 (MISCELLANEOUS) ×2 IMPLANT
COVER DOME SNAP 22 D (MISCELLANEOUS) ×2 IMPLANT
COVER PRB 48X5XTLSCP FOLD TPE (BAG) ×1 IMPLANT
COVER PROBE 5X48 (BAG) ×2
KIT MICROINTRODUCER STIFF 5F (SHEATH) ×2 IMPLANT
PROTECTION STATION PRESSURIZED (MISCELLANEOUS) ×2
STATION PROTECTION PRESSURIZED (MISCELLANEOUS) ×1 IMPLANT
STOPCOCK MORSE 400PSI 3WAY (MISCELLANEOUS) ×2 IMPLANT
TRAY PV CATH (CUSTOM PROCEDURE TRAY) ×2 IMPLANT
TUBING CIL FLEX 10 FLL-RA (TUBING) ×2 IMPLANT
WIRE BENTSON .035X145CM (WIRE) ×2 IMPLANT
WIRE TORQFLEX AUST .018X40CM (WIRE) ×2 IMPLANT

## 2015-11-22 NOTE — Op Note (Signed)
   PATIENT: [name]   MRN: [id number] DOB: [birth year]    DATE OF PROCEDURE: 11/22/2015  INDICATIONS: [name] is a 64 y.o. female who is not yet on dialysis. She has a right radiocephalic fistula which has not matured adequately. She presents for a fistulogram.  PROCEDURE:  1. Ultrasound-guided access to right radiocephalic AV fistula 2. Fistulogram right radiocephalic AV fistula  SURGEON: Judeth Cornfield. Scot Dock, MD, FACS  ANESTHESIA: local   EBL: minimal  TECHNIQUE: The patient was taken to the peripheral vascular lab. The right arm was prepped and draped in the usual sterile fashion. After the skin was anesthetized with lidocaine and under ultrasound guidance I attempted to cannulate the fistula twice but the wire would not pass. Pressure was held for hemostasis. I cannulated up higher after the skin was anesthetized under ultrasound guidance and was able to cannulate the vein which was small. The micropuncture sheath was introduced over the wire. The fistula gram wasn't obtained evaluating the fistula from the site of cannulation centrally. Blood pressure cuff was inflated to cease supra systolic pressures and a retrograde shot was obtained to evaluate the arterial anastomosis.  FINDINGS:  1. The radial artery is small. 2. There is a long segment stenosis in the proximal fistula over a fairly long length. 3. The fistula itself was otherwise patent but has a connection to the deep system just below the antecubital level. 4. Upper arm cephalic vein is patent.  CLINICAL NOTE: This patient will need to be scheduled for new access in the right arm potentially a right brachiocephalic AV fistula. She will also likely need a catheter as she is to begin dialysis according to the patient in early January. She would like to schedule this after Christmas.  Deitra Mayo, MD, FACS [facility]  DATE OF DICTATION:   11/22/2015

## 2015-11-22 NOTE — Discharge Instructions (Signed)
Fistulogram, Care After °Refer to this sheet in the next few weeks. These instructions provide you with information on caring for yourself after your procedure. Your health care provider may also give you more specific instructions. Your treatment has been planned according to current medical practices, but problems sometimes occur. Call your health care provider if you have any problems or questions after your procedure. °WHAT TO EXPECT AFTER THE PROCEDURE °After your procedure, it is typical to have the following: °· A small amount of discomfort in the area where the catheters were placed. °· A small amount of bruising around the fistula. °· Sleepiness and fatigue. °HOME CARE INSTRUCTIONS °· Rest at home for the day following your procedure. °· Do not drive or operate heavy machinery while taking pain medicine. °· Take medicines only as directed by your health care provider. °· Do not take baths, swim, or use a hot tub until your health care provider approves. You may shower 24 hours after the procedure or as directed by your health care provider. °· There are many different ways to close and cover an incision, including stitches, skin glue, and adhesive strips. Follow your health care provider's instructions on: °¨ Incision care. °¨ Bandage (dressing) changes and removal. °¨ Incision closure removal. °· Monitor your dialysis fistula carefully. °SEEK MEDICAL CARE IF: °· You have drainage, redness, swelling, or pain at your catheter site. °· You have a fever. °· You have chills. °SEEK IMMEDIATE MEDICAL CARE IF: °· You feel weak. °· You have trouble balancing. °· You have trouble moving your arms or legs. °· You have problems with your speech or vision. °· You can no longer feel a vibration or buzz when you put your fingers over your dialysis fistula. °· The limb that was used for the procedure: °¨ Swells. °¨ Is painful. °¨ Is cold. °¨ Is discolored, such as blue or pale white. °  °This information is not intended  to replace advice given to you by your health care provider. Make sure you discuss any questions you have with your health care provider. °  °Document Released: 04/06/2014 Document Reviewed: 04/06/2014 °Elsevier Interactive Patient Education ©2016 [employer] ° °

## 2015-11-22 NOTE — Interval H&P Note (Signed)
History and Physical Interval Note:  11/22/2015 7:31 AM  [name]  has presented today for surgery, with the diagnosis of end stage renal  The various methods of treatment have been discussed with the patient and family. After consideration of risks, benefits and other options for treatment, the patient has consented to  Procedure(s): Fistulagram (N/A) as a surgical intervention .  [name] history has been reviewed, patient examined, no change in status, stable for surgery.  I have reviewed the patient's chart and labs.  Questions were answered to the patient's satisfaction.     Deitra Mayo

## 2015-11-22 NOTE — H&P (View-Only) (Signed)
[facility] of [place]  Patient name: [name] MRN: [id number] DOB: [birth year] Sex: female  REASON FOR VISIT: to evaluate poorly maturing right radiocephalic AV fistula. Referred by Dr. Lowanda Foster.  HPI: [name] is a 64 y.o. female who is not yet on dialysis but is nearing the need for dialysis. She had a right radiocephalic AV fistula placed on 07/20/2015. The cephalic vein was 3 mm. I saw the patient in follow up on 09/01/2015. At that time, the diameters of fistula ranged from 0.54-0.57 cm. The fistula had an excellent thrill and I thought it would likely be adequate for access. However, she returns now and the fistula has not continued to mature. She has been admitted with some shortness of breath and may need dialysis sooner than later and we were asked to reevaluate her fistula. She denies any shortness of breath today or nausea or vomiting.  Past Medical History  Diagnosis Date  . Esophageal stricture   . Hyperlipidemia   . Hypertension   . Bronchitis   . CAD (coronary artery disease)     s/p Stenting in 2005;  [place] 7/09: Vigorous LV function, 2-3+ MR, RI 40%, RI stent patent, proximal-mid RCA 40-50%;  Echo 7/09:  EF 55-65%, trivial MR;  [place] 11/18/12: EF 66%, normal LV wall motion, mild to moderate anterior ischemia - reviewed by [facility] and felt to rep breast atten (low risk);  Echo 6/14: mild LVH, EF 55-65%, Gr 2 DD, PASP 44, trivial eff   . Arthritis   . IDDM (insulin dependent diabetes mellitus) ([place])   . Diverticulosis   . Diabetic neuropathy ([place])   . Vitamin D deficiency   . Degenerative joint disease     left shoulder  . Chronic neck pain   . Headache(784.0)   . Anemia     hx of  . Stroke [facility])     "mini- years ago"  . Chronic kidney disease     CREATININE IS UP--GOING TO KIDNEY MD   . CHF (congestive heart failure) ([place])   . Pericardial effusion 03/06/2015    Very small per echo ; pleural nodules seen on CT chest.  . Acute on chronic  diastolic CHF (congestive heart failure) ([place]) 03/05/2015    Grade 2. EF 60-65%  . COPD (chronic obstructive pulmonary disease) ([place])   . Peripheral vascular disease ([place])     Family History  Problem Relation Age of Onset  . Heart disease Other   . Hypertension Father   . Cancer Father   . Hypertension Daughter     SOCIAL HISTORY: Social History  Substance Use Topics  . Smoking status: Former Smoker -- 1.00 packs/day for 30 years    Quit date: 12/04/2001  . Smokeless tobacco: Never Used  . Alcohol Use: 3.6 oz/week    6 Cans of beer per week     Comment: occasional    Allergies  Allergen Reactions  . Clopidogrel Bisulfate Other (See Comments)    REACTION: Sick, Headache, "Felt terrible"  . Codeine Other (See Comments)    REACTION: hallucinations  . Ace Inhibitors Other (See Comments)    cough  . Lisinopril Other (See Comments)    cough  . Penicillins Other (See Comments)    unknown    Current Outpatient Prescriptions  Medication Sig Dispense Refill  . albuterol (PROVENTIL HFA;VENTOLIN HFA) 108 (90 BASE) MCG/ACT inhaler Inhale 2 puffs into the lungs every 6 (six) hours as needed for wheezing or shortness of  breath. 1 Inhaler 2  . albuterol (PROVENTIL) (2.5 MG/3ML) 0.083% nebulizer solution Take 3 mLs (2.5 mg total) by nebulization every 6 (six) hours as needed for wheezing or shortness of breath. 75 mL 12  . aspirin 81 MG EC tablet Take 81 mg by mouth daily.      [name] atenolol (TENORMIN) 50 MG tablet Take 1 tablet (50 mg total) by mouth daily. 60 tablet 5  . budesonide-formoterol (SYMBICORT) 160-4.5 MCG/ACT inhaler Inhale 2 puffs into the lungs 2 (two) times daily. 1 Inhaler 12  . calcitRIOL (ROCALTROL) 0.25 MCG capsule Take 0.25 mcg by mouth daily.    . Carboxymethylcellulose Sodium (THERATEARS OP) Place 2 drops into both eyes as needed (for dry eyes).    . cholecalciferol (VITAMIN D) 1000 UNITS tablet Take 1,000 Units by mouth every morning.    . cloNIDine (CATAPRES) 0.1  MG tablet Take 0.1 mg by mouth 2 (two) times daily.    [name] docusate sodium (COLACE) 100 MG capsule Take 100 mg by mouth 2 (two) times daily.    [name] gabapentin (NEURONTIN) 300 MG capsule Take 1 capsule (300 mg total) by mouth at bedtime. DOSE DECREASED TO ONCE DAILY-AT BEDTIME BECAUSE OF YOUR KIDNEY FUNCTION    . glucose blood test strip Test 4 times daily 120 each 5  . hydrALAZINE (APRESOLINE) 50 MG tablet Take 2 tablets (100 mg total) by mouth 2 (two) times daily. New medicine for high blood pressure 60 tablet 3  . insulin lispro (HUMALOG) 100 UNIT/ML injection Inject 0.1-0.13 mLs (10-13 Units total) into the skin 3 (three) times daily with meals. To be administered as directed per sliding scale: 90-150= 10  Units 151-200=11 units 201-250=12 units 251-300= 13 units  To call MD if levels are between 251-300 for three consecutive days 10 mL 1  . LANTUS SOLOSTAR 100 UNIT/ML Solostar Pen INJECT 72 UNITS SUBCUTANEOUSLY AT 10PM. (Patient taking differently: 30 units at bedtime) 15 mL 3  . NIFEdipine (PROCARDIA XL/ADALAT-CC) 90 MG 24 hr tablet Take 90 mg by mouth every evening.     . nitroGLYCERIN (NITROSTAT) 0.4 MG SL tablet Place 1 tablet (0.4 mg total) under the tongue every 5 (five) minutes as needed for chest pain. 25 tablet 5  . pantoprazole (PROTONIX) 40 MG tablet Take 1 tablet (40 mg total) by mouth 2 (two) times daily. For gastric acid reflux. 60 tablet 3  . polyethylene glycol (MIRALAX / GLYCOLAX) packet Take 17 g by mouth daily.    . potassium chloride (K-DUR) 10 MEQ tablet Take 20 mEq by mouth every other day.     . rosuvastatin (CRESTOR) 20 MG tablet TAKE 1 TABLET IN THE EVENING. 30 tablet 5  . torsemide (DEMADEX) 20 MG tablet Take 2 tablets (40 mg total) by mouth daily. New medicine to reduce fluid build up. (Patient taking differently: Take 40 mg by mouth daily. Take 20 mg extra tablet if weight is over 3-4 pounds.) 60 tablet 3  . Linaclotide (LINZESS) 290 MCG CAPS capsule Take 1 capsule (290  mcg total) by mouth daily. (Patient not taking: Reported on 11/03/2015) 30 capsule 2   No current facility-administered medications for this visit.    REVIEW OF SYSTEMS:  [X]  denotes positive finding, [ ]  denotes negative finding Cardiac  Comments:  Chest pain or chest pressure: X   Shortness of breath upon exertion: X   Short of breath when lying flat: X   Irregular heart rhythm:        Vascular  Pain in calf, thigh, or hip brought on by ambulation:    Pain in feet at night that wakes you up from your sleep:     Blood clot in your veins:    Leg swelling:         Pulmonary    Oxygen at home:    Productive cough:     Wheezing:  X       Neurologic    Sudden weakness in arms or legs:     Sudden numbness in arms or legs:     Sudden onset of difficulty speaking or slurred speech:    Temporary loss of vision in one eye:     Problems with dizziness:         Gastrointestinal    Blood in stool:     Vomited blood:         Genitourinary    Burning when urinating:     Blood in urine:        Psychiatric    Major depression:         Hematologic    Bleeding problems:    Problems with blood clotting too easily:        Skin    Rashes or ulcers:        Constitutional    Fever or chills:      PHYSICAL EXAM: Filed Vitals:   11/03/15 1244 11/03/15 1246  BP: 150/66 152/62  Pulse: 56 56  Temp: 97.9 F (36.6 C)   TempSrc: Oral   Resp: 18   Height: 5\' 2"  (1.575 m)   Weight: 200 lb (90.719 kg)   SpO2: 98%     GENERAL: The patient is a well-nourished female, in no acute distress. The vital signs are documented above. CARDIAC: There is a regular rate and rhythm.  VASCULAR: her right radiocephalic fistula has a good thrill although the vein is somewhat small. She has a palpable right radial pulse. PULMONARY: There is good air exchange bilaterally without wheezing or rales. NEUROLOGIC: No focal weakness or paresthesias are detected. SKIN: There are no ulcers or rashes  noted. PSYCHIATRIC: The patient has a normal affect.  DATA:  I have independently interpreted her right upper extremity dialysis duplex. The radiocephalic fistula has multiple areas of narrowing within the fistula and is not adequate in size in the forearm. The upper arm cephalic vein looks reasonable in size.  MEDICAL ISSUES:  STAGE IV CHRONIC KIDNEY DISEASE: I reviewed her duplex today which shows the diameters of the fistula are not adequate in the forearm. Diameters range from 0.26-0.43 cm. There are also multiple areas of stenosis within the fistula. I have discussed our options with her. I have offered her the option of proceeding with a fistulogram with limited contrast to see if there is anything that can be done to improve the maturation of her right radiocephalic fistula. This would include possible angioplasty of a vein graft stenosis. If this were not possible this would help [place] determine if there were any surgical options for revision. I've explained that I think the chances are unlikely that we will be able to salvage the fistula as there appeared to be multiple areas of narrowing in the vein is simply not maturing adequately. I have also offered her the option of proceeding with placement of a new right brachiocephalic fistula as this vein looks to be larger in size. It too was small in some areas however. If she needs dialysis in the near future we  could certainly place a catheter at the same time. After much discussion she would like to proceed with a fistulogram and this has been scheduled for 11/22/2015.   Deitra Mayo [facility] [place]: [phone]

## 2015-11-23 ENCOUNTER — Other Ambulatory Visit (INDEPENDENT_AMBULATORY_CARE_PROVIDER_SITE_OTHER): Payer: Medicare Other

## 2015-11-23 ENCOUNTER — Other Ambulatory Visit: Payer: Self-pay

## 2015-11-23 DIAGNOSIS — N186 End stage renal disease: Secondary | ICD-10-CM | POA: Diagnosis not present

## 2015-11-23 DIAGNOSIS — Z111 Encounter for screening for respiratory tuberculosis: Secondary | ICD-10-CM

## 2015-11-23 NOTE — Progress Notes (Signed)
Lab only 

## 2015-11-23 NOTE — Addendum Note (Signed)
Addended by: Thana Ates on: 11/23/2015 10:24 AM   Modules accepted: Orders

## 2015-11-24 LAB — RENAL FUNCTION PANEL
Albumin: 4 g/dL (ref 3.6–4.8)
BUN/Creatinine Ratio: 15 (ref 11–26)
BUN: 101 mg/dL (ref 8–27)
CO2: 21 mmol/L (ref 18–29)
Calcium: 9 mg/dL (ref 8.7–10.3)
Chloride: 102 mmol/L (ref 96–106)
Creatinine, Ser: 6.59 mg/dL (ref 0.57–1.00)
GFR calc Af Amer: 7 mL/min/{1.73_m2} — ABNORMAL LOW (ref >59)
GFR calc non Af Amer: 6 mL/min/{1.73_m2} — ABNORMAL LOW (ref >59)
Glucose: 201 mg/dL — ABNORMAL HIGH (ref 65–99)
Phosphorus: 5.7 mg/dL — ABNORMAL HIGH (ref 2.5–4.5)
Potassium: 4.3 mmol/L (ref 3.5–5.2)
Sodium: 144 mmol/L (ref 134–144)

## 2015-11-24 LAB — HEPATITIS C ANTIBODY: Hep C Virus Ab: <0.1 s/co ratio (ref 0.0–0.9)

## 2015-11-24 LAB — HEPATITIS B SURFACE ANTIBODY,QUALITATIVE: Hep B Surface Ab, Qual: NONREACTIVE

## 2015-11-25 ENCOUNTER — Encounter (HOSPITAL_COMMUNITY): Payer: Self-pay | Admitting: *Deleted

## 2015-11-25 ENCOUNTER — Telehealth: Payer: Self-pay | Admitting: Pediatrics

## 2015-11-25 LAB — TB SKIN TEST
Induration: 0 mm
TB Skin Test: NEGATIVE

## 2015-11-25 MED ORDER — VANCOMYCIN HCL IN DEXTROSE 1-5 GM/200ML-% IV SOLN
1000.0000 mg | INTRAVENOUS | Status: AC
  Administered 2015-11-26: 1000 mg via INTRAVENOUS
  Filled 2015-11-25: qty 200

## 2015-11-25 MED ORDER — SODIUM CHLORIDE 0.9 % IV SOLN: INTRAVENOUS | Status: DC

## 2015-11-25 MED ORDER — CHLORHEXIDINE GLUCONATE CLOTH 2 % EX PADS: 6.0000 | MEDICATED_PAD | Freq: Once | CUTANEOUS | Status: DC

## 2015-11-25 NOTE — Progress Notes (Signed)
Pt has history of CAD, followed by Dr. Percival Spanish. She denies any recent chest pain or sob.   Pt is diabetic, states her fasting blood sugars have been running high recently. States yesterday it was 196. Her last A1C in EPIC was from 03/08/15 and was 7.8.  Pt instructed to take 80% of her Lantus dose tonight - 24 units (80% of 30 units) and if blood sugar is greater than 220 in the AM, instructed her to take 8 units of Humalog insulin. Also instructed her if blood sugar is less than 70 in the AM, to treat with a 1/2 cup of clear juice (cranberry or apple) and recheck her blood sugar 15 minutes later. She voiced understanding.

## 2015-11-25 NOTE — Progress Notes (Signed)
Anesthesia Chart Review: SAME DAY WORK-UP.  Patient is a 64 year old female scheduled for insertion of a dialysis catheter, creation of a right brachiocephalic AVF versus basilic vein transposition on 11/26/15 by Dr. Scot Dock.  History includes stage IV-V CKD (anticipated to start dialysis ~ 12/2015) s/p right radiocephalic AVF 99991111 (did not mature), CAD s/p ramus stent and PTCA OM3 '03 stent '05, chronic diastolic CHF, PVD, COPD, former smoker, HTN, IDDM with neuropathy, obesity, HLD, anemia, esophageal stricture, hysterectomy, c-spine surgery, bilateral cataract extraction '15, lumbar laminectomy '13. She has had several admissions for acute on chronic diastolic CHF in the setting of worsening CKD, last on 10/17/15 [facility]). Discharge summary is scanned under Media tab. She presented with respiratory failure which was thought to be a combination of CHF and COPD. The mainstay of treatment was diuresis. Nephrology was consulted.   PCP is with [facility]. Nephrologist is Dr. Lowanda Foster. Pulmonologist is Dr. Luan Pulling.   Cardiologist is Dr. Percival Spanish, last visit 11/05/15. He felt that her recent admission was not likely related to ischemia, but was awaiting records. If these showed any concern for ischemia then he would consider stress test. (Discharge summary did not indicate any concern for ischemia. Troponin T was 0.01 -> 0.24 -> 0.27. EKG appeared stable.) He felt she would continue to have problems with volume/CHF until she is on dialysis. He was also looking in to whether or not she had a recent chest CT at [facility], because she is due for a chest CT to evaluate pleural nodules and right lung opacity seen on 03/07/15. Today, she told our PAT phone RN that she was feeling much better since her hospitalization and ED visit. She denied CP and SOB. She has been compliant with her medications.   Meds include albuterol, ASA 81 mg, atenolol, Symbicort, clacitriol, clonidine, Flonase,  Neurontin, hydralazine, Lantus, Humalog, Imdur, Linzess, Ativan, Nitro, nifedipine, Protonix, KCl, torsemide, Crestor. Was on 4 day course of prednisone 11/14/15 for SOB but was not felt to need emergent dialysis at that time.  10/17/15 EKG [facility]): SR, anterolateral infarct (old), prolonged QT interval. Per Dr. Almira Coaster, Compared to 09/13/15 ECG T wave abnormality no longer present. on-specific lateral T wave abnormality. When compared to last tracing (03/05/15) in [facility] EPIC, EKG appears similar other than new Q wave in V6. She has had findings of anterolateral infarct on prior EKGs.   08/19/15 Echo: Impressions: Mild LVH with LVEF 60-65%. Restrictive diastolic filling pattern noted with increased filling pressures. Severe left atrial enlargement. Mitral regurgitation. Trivial tricuspidregurgitation, unable to assess PASP. Small, circumferentialpericardial effusion.  11/18/12 Nuclear stress test: Overall Impression: Intermediate risk stress nuclear study with a medium size, medium intensity, reversible anterior defect consistent with mild to moderate anterior ischemia. LV Ejection Fraction: 66%. LV Wall Motion: NL LV Function; NL Wall Motion. Results were reviewed by Dr. Percival Spanish and according to 01/07/13 notation, "I have reviewed the patients stress test from Dec. We compared this with the 2012. There is hypoperfusion of the anterior wall. However, this was felt to be breast attenuation in 2012 and it looks unchanged on the current scan. Therefore, based on ACC/AHA guidelines, the patient would be at acceptable risk for the planned procedure without further cardiovascular testing.I have reviewed the patients stress test from Dec. We compared this with the 2012. There is hypoperfusion of the anterior wall. However, this was felt to be breast attenuation in 2012 and it looks unchanged on the current scan. Therefore, based on ACC/AHA guidelines,  the patient would be at acceptable risk for  the planned procedure without further cardiovascular testing." This stress test was done as part of a clearance for left rotator cuff surgery.  06/17/08 LHC/RHC (done for recurrent chest pain): Vigorous LV function, 2-3+ MR, RI 40%, RI stent patent, proximal-mid RCA 40-50%, luminal irregularities LAD and CX (Echo 7/09: EF 55-65%, trivial MR).  03/17/15 PFTs: FVC 2.02 (85%), FEV1 1.70 (92%), DLCOunc 13.62 (63%). No significant change with BD. Findings felt consistent with COPD.  11/13/15 CXR: IMPRESSION: No active cardiopulmonary disease.  She is for labs on arrival.   Reviewed above with anesthesiologist Dr. Marcie Bal. Patient with worsening CKD and recurrent admission for diastolic CHF. She is scheduled to start hemodialysis next month and needs HD access. Further evaluation by her anesthesiologist on the day of surgery to ensure no labs are acceptable and no acute CV/CHF symptoms prior to proceeding.  Myra Gianotti, PA-C [facility] Short Stay Center/Anesthesiology Phone [phone] 11/25/2015 1:20 PM

## 2015-11-26 ENCOUNTER — Ambulatory Visit (HOSPITAL_COMMUNITY): Payer: Medicare Other | Admitting: Vascular Surgery

## 2015-11-26 ENCOUNTER — Encounter (HOSPITAL_COMMUNITY): Payer: Self-pay | Admitting: *Deleted

## 2015-11-26 ENCOUNTER — Ambulatory Visit (HOSPITAL_COMMUNITY)
Admission: RE | Admit: 2015-11-26 | Discharge: 2015-11-26 | Disposition: A | Discharge status: Home / Self Care | Payer: Medicare Other | Source: Ambulatory Visit | Attending: Vascular Surgery | Admitting: Vascular Surgery

## 2015-11-26 ENCOUNTER — Encounter (HOSPITAL_COMMUNITY)
Admission: RE | Disposition: A | Discharge status: Home / Self Care | Payer: Self-pay | Source: Ambulatory Visit | Attending: Vascular Surgery

## 2015-11-26 ENCOUNTER — Telehealth: Payer: Self-pay | Admitting: Vascular Surgery

## 2015-11-26 ENCOUNTER — Ambulatory Visit (HOSPITAL_COMMUNITY): Payer: Medicare Other

## 2015-11-26 ENCOUNTER — Other Ambulatory Visit: Payer: Medicare Other | Admitting: *Deleted

## 2015-11-26 DIAGNOSIS — I2510 Atherosclerotic heart disease of native coronary artery without angina pectoris: Secondary | ICD-10-CM | POA: Diagnosis not present

## 2015-11-26 DIAGNOSIS — E1140 Type 2 diabetes mellitus with diabetic neuropathy, unspecified: Secondary | ICD-10-CM | POA: Insufficient documentation

## 2015-11-26 DIAGNOSIS — N185 Chronic kidney disease, stage 5: Secondary | ICD-10-CM | POA: Insufficient documentation

## 2015-11-26 DIAGNOSIS — Z79899 Other long term (current) drug therapy: Secondary | ICD-10-CM | POA: Insufficient documentation

## 2015-11-26 DIAGNOSIS — E1122 Type 2 diabetes mellitus with diabetic chronic kidney disease: Secondary | ICD-10-CM | POA: Diagnosis not present

## 2015-11-26 DIAGNOSIS — M1990 Unspecified osteoarthritis, unspecified site: Secondary | ICD-10-CM | POA: Diagnosis not present

## 2015-11-26 DIAGNOSIS — N186 End stage renal disease: Secondary | ICD-10-CM

## 2015-11-26 DIAGNOSIS — Z8673 Personal history of transient ischemic attack (TIA), and cerebral infarction without residual deficits: Secondary | ICD-10-CM | POA: Diagnosis not present

## 2015-11-26 DIAGNOSIS — E1151 Type 2 diabetes mellitus with diabetic peripheral angiopathy without gangrene: Secondary | ICD-10-CM | POA: Diagnosis not present

## 2015-11-26 DIAGNOSIS — E559 Vitamin D deficiency, unspecified: Secondary | ICD-10-CM | POA: Insufficient documentation

## 2015-11-26 DIAGNOSIS — I5032 Chronic diastolic (congestive) heart failure: Secondary | ICD-10-CM | POA: Diagnosis not present

## 2015-11-26 DIAGNOSIS — Z7982 Long term (current) use of aspirin: Secondary | ICD-10-CM | POA: Diagnosis not present

## 2015-11-26 DIAGNOSIS — Y832 Surgical operation with anastomosis, bypass or graft as the cause of abnormal reaction of the patient, or of later complication, without mention of misadventure at the time of the procedure: Secondary | ICD-10-CM | POA: Diagnosis not present

## 2015-11-26 DIAGNOSIS — E785 Hyperlipidemia, unspecified: Secondary | ICD-10-CM | POA: Insufficient documentation

## 2015-11-26 DIAGNOSIS — T82590A Other mechanical complication of surgically created arteriovenous fistula, initial encounter: Secondary | ICD-10-CM | POA: Diagnosis not present

## 2015-11-26 DIAGNOSIS — Z4931 Encounter for adequacy testing for hemodialysis: Secondary | ICD-10-CM

## 2015-11-26 DIAGNOSIS — Z7951 Long term (current) use of inhaled steroids: Secondary | ICD-10-CM | POA: Insufficient documentation

## 2015-11-26 DIAGNOSIS — N184 Chronic kidney disease, stage 4 (severe): Secondary | ICD-10-CM | POA: Diagnosis not present

## 2015-11-26 DIAGNOSIS — Z955 Presence of coronary angioplasty implant and graft: Secondary | ICD-10-CM | POA: Insufficient documentation

## 2015-11-26 DIAGNOSIS — J449 Chronic obstructive pulmonary disease, unspecified: Secondary | ICD-10-CM | POA: Insufficient documentation

## 2015-11-26 DIAGNOSIS — I132 Hypertensive heart and chronic kidney disease with heart failure and with stage 5 chronic kidney disease, or end stage renal disease: Secondary | ICD-10-CM | POA: Diagnosis not present

## 2015-11-26 DIAGNOSIS — Z87891 Personal history of nicotine dependence: Secondary | ICD-10-CM | POA: Insufficient documentation

## 2015-11-26 DIAGNOSIS — T82828A Fibrosis of vascular prosthetic devices, implants and grafts, initial encounter: Secondary | ICD-10-CM | POA: Diagnosis not present

## 2015-11-26 DIAGNOSIS — Z6834 Body mass index (BMI) 34.0-34.9, adult: Secondary | ICD-10-CM | POA: Insufficient documentation

## 2015-11-26 DIAGNOSIS — Z794 Long term (current) use of insulin: Secondary | ICD-10-CM | POA: Insufficient documentation

## 2015-11-26 DIAGNOSIS — Z95828 Presence of other vascular implants and grafts: Secondary | ICD-10-CM

## 2015-11-26 DIAGNOSIS — Z419 Encounter for procedure for purposes other than remedying health state, unspecified: Secondary | ICD-10-CM

## 2015-11-26 DIAGNOSIS — D649 Anemia, unspecified: Secondary | ICD-10-CM | POA: Diagnosis not present

## 2015-11-26 DIAGNOSIS — Z992 Dependence on renal dialysis: Secondary | ICD-10-CM

## 2015-11-26 HISTORY — PX: INSERTION OF DIALYSIS CATHETER: SHX1324

## 2015-11-26 HISTORY — DX: Anxiety disorder, unspecified: F41.9

## 2015-11-26 HISTORY — DX: Gastro-esophageal reflux disease without esophagitis: K21.9

## 2015-11-26 HISTORY — PX: AV FISTULA PLACEMENT: SHX1204

## 2015-11-26 LAB — GLUCOSE, CAPILLARY
Glucose-Capillary: 136 mg/dL — ABNORMAL HIGH (ref 65–99)
Glucose-Capillary: 170 mg/dL — ABNORMAL HIGH (ref 65–99)

## 2015-11-26 LAB — POCT I-STAT 4, (NA,K, GLUC, HGB,HCT)
Glucose, Bld: 146 mg/dL — ABNORMAL HIGH (ref 65–99)
HCT: 29 % — ABNORMAL LOW (ref 36.0–46.0)
Hemoglobin: 9.9 g/dL — ABNORMAL LOW (ref 12.0–15.0)
Potassium: 4.1 mmol/L (ref 3.5–5.1)
Sodium: 142 mmol/L (ref 135–145)

## 2015-11-26 SURGERY — INSERTION OF DIALYSIS CATHETER
Anesthesia: Monitor Anesthesia Care | Site: Neck | Laterality: Right

## 2015-11-26 MED ORDER — LIDOCAINE HCL (PF) 1 % IJ SOLN
INTRAMUSCULAR | Status: AC
Start: 2015-11-26 — End: 2015-11-26
  Filled 2015-11-26: qty 30

## 2015-11-26 MED ORDER — ONDANSETRON HCL 4 MG/2ML IJ SOLN
INTRAMUSCULAR | Status: DC | PRN
  Administered 2015-11-26: 4 mg via INTRAVENOUS

## 2015-11-26 MED ORDER — PROTAMINE SULFATE 10 MG/ML IV SOLN
INTRAVENOUS | Status: DC | PRN
  Administered 2015-11-26: 40 mg via INTRAVENOUS

## 2015-11-26 MED ORDER — HEPARIN SODIUM (PORCINE) 1000 UNIT/ML IJ SOLN
INTRAMUSCULAR | Status: DC | PRN
  Administered 2015-11-26: 7000 [IU] via INTRAVENOUS

## 2015-11-26 MED ORDER — PROPOFOL 10 MG/ML IV BOLUS
INTRAVENOUS | Status: AC
Start: 2015-11-26 — End: 2015-11-26
  Filled 2015-11-26: qty 20

## 2015-11-26 MED ORDER — 0.9 % SODIUM CHLORIDE (POUR BTL) OPTIME
TOPICAL | Status: DC | PRN
Start: 2015-11-26 — End: 2015-11-26
  Administered 2015-11-26: 1000 mL

## 2015-11-26 MED ORDER — MIDAZOLAM HCL 2 MG/2ML IJ SOLN
INTRAMUSCULAR | Status: AC
  Filled 2015-11-26: qty 2

## 2015-11-26 MED ORDER — PROPOFOL 10 MG/ML IV BOLUS
INTRAVENOUS | Status: DC | PRN
  Administered 2015-11-26: 20 mg via INTRAVENOUS

## 2015-11-26 MED ORDER — PROPOFOL 500 MG/50ML IV EMUL
INTRAVENOUS | Status: DC | PRN
  Administered 2015-11-26: 25 ug/kg/min via INTRAVENOUS

## 2015-11-26 MED ORDER — LIDOCAINE-EPINEPHRINE (PF) 1 %-1:200000 IJ SOLN
INTRAMUSCULAR | Status: AC
  Filled 2015-11-26: qty 30

## 2015-11-26 MED ORDER — LIDOCAINE-EPINEPHRINE (PF) 1 %-1:200000 IJ SOLN
INTRAMUSCULAR | Status: DC | PRN
Start: 2015-11-26 — End: 2015-11-26
  Administered 2015-11-26: 30 mL

## 2015-11-26 MED ORDER — MIDAZOLAM HCL 5 MG/5ML IJ SOLN
INTRAMUSCULAR | Status: DC | PRN
  Administered 2015-11-26: 1 mg via INTRAVENOUS
  Administered 2015-11-26: .5 mg via INTRAVENOUS

## 2015-11-26 MED ORDER — HYDROMORPHONE HCL 1 MG/ML IJ SOLN
0.2500 mg | INTRAMUSCULAR | Status: DC | PRN
  Administered 2015-11-26: 0.25 mg via INTRAVENOUS

## 2015-11-26 MED ORDER — LIDOCAINE HCL (PF) 1 % IJ SOLN
INTRAMUSCULAR | Status: DC | PRN
Start: 2015-11-26 — End: 2015-11-26
  Administered 2015-11-26: 30 mL

## 2015-11-26 MED ORDER — OXYCODONE-ACETAMINOPHEN 5-325 MG PO TABS: 1.0000 | ORAL_TABLET | Freq: Four times a day (QID) | ORAL | Status: DC | PRN

## 2015-11-26 MED ORDER — SODIUM CHLORIDE 0.9 % IV SOLN
INTRAVENOUS | Status: DC | PRN
  Administered 2015-11-26: 500 mL

## 2015-11-26 MED ORDER — HEPARIN SODIUM (PORCINE) 1000 UNIT/ML IJ SOLN
INTRAMUSCULAR | Status: AC
  Filled 2015-11-26: qty 1

## 2015-11-26 MED ORDER — HYDROMORPHONE HCL 1 MG/ML IJ SOLN
INTRAMUSCULAR | Status: AC
  Filled 2015-11-26: qty 1

## 2015-11-26 MED ORDER — SODIUM CHLORIDE 0.9 % IV SOLN
INTRAVENOUS | Status: DC | PRN
  Administered 2015-11-26: 07:00:00 via INTRAVENOUS

## 2015-11-26 MED ORDER — FENTANYL CITRATE (PF) 250 MCG/5ML IJ SOLN
INTRAMUSCULAR | Status: AC
  Filled 2015-11-26: qty 5

## 2015-11-26 MED ORDER — HEPARIN SODIUM (PORCINE) 1000 UNIT/ML IJ SOLN
INTRAMUSCULAR | Status: DC | PRN
Start: 2015-11-26 — End: 2015-11-26
  Administered 2015-11-26: 1000 [IU]

## 2015-11-26 SURGICAL SUPPLY — 57 items
ARMBAND PINK RESTRICT EXTREMIT (MISCELLANEOUS) ×3 IMPLANT
BAG DECANTER FOR FLEXI CONT (MISCELLANEOUS) ×3 IMPLANT
BIOPATCH RED 1 DISK 7.0 (GAUZE/BANDAGES/DRESSINGS) ×3 IMPLANT
CANISTER SUCTION 2500CC (MISCELLANEOUS) ×3 IMPLANT
CANNULA VESSEL 3MM 2 BLNT TIP (CANNULA) ×3 IMPLANT
CATH CANNON HEMO 15F 50CM (CATHETERS) IMPLANT
CATH CANNON HEMO 15FR 19 (HEMODIALYSIS SUPPLIES) IMPLANT
CATH CANNON HEMO 15FR 23CM (HEMODIALYSIS SUPPLIES) IMPLANT
CATH CANNON HEMO 15FR 31CM (HEMODIALYSIS SUPPLIES) IMPLANT
CATH CANNON HEMO 15FR 32 (HEMODIALYSIS SUPPLIES) IMPLANT
CATH CANNON HEMO 15FR 32CM (HEMODIALYSIS SUPPLIES) IMPLANT
CATH PALINDROME REV 23CM (CATHETERS) ×1 IMPLANT
CHLORAPREP W/TINT 26ML (MISCELLANEOUS) ×3 IMPLANT
CLIP TI MEDIUM 6 (CLIP) ×3 IMPLANT
CLIP TI WIDE RED SMALL 6 (CLIP) ×3 IMPLANT
COVER PROBE W GEL 5X96 (DRAPES) ×1 IMPLANT
DECANTER SPIKE VIAL GLASS SM (MISCELLANEOUS) ×3 IMPLANT
DRAPE C-ARM 42X72 X-RAY (DRAPES) ×3 IMPLANT
DRAPE CHEST BREAST 15X10 FENES (DRAPES) ×3 IMPLANT
DRSG TEGADERM 4X4.75 (GAUZE/BANDAGES/DRESSINGS) ×1 IMPLANT
ELECT REM PT RETURN 9FT ADLT (ELECTROSURGICAL) ×3
ELECTRODE REM PT RTRN 9FT ADLT (ELECTROSURGICAL) ×2 IMPLANT
GAUZE SPONGE 2X2 8PLY STRL LF (GAUZE/BANDAGES/DRESSINGS) ×2 IMPLANT
GAUZE SPONGE 4X4 16PLY XRAY LF (GAUZE/BANDAGES/DRESSINGS) ×3 IMPLANT
GLOVE BIO SURGEON STRL SZ 6.5 (GLOVE) ×4 IMPLANT
GLOVE BIO SURGEON STRL SZ7.5 (GLOVE) ×3 IMPLANT
GLOVE BIOGEL PI IND STRL 6.5 (GLOVE) IMPLANT
GLOVE BIOGEL PI IND STRL 8 (GLOVE) ×2 IMPLANT
GLOVE BIOGEL PI INDICATOR 6.5 (GLOVE) ×3
GLOVE BIOGEL PI INDICATOR 8 (GLOVE) ×2
GOWN STRL REUS W/ TWL LRG LVL3 (GOWN DISPOSABLE) ×6 IMPLANT
GOWN STRL REUS W/TWL LRG LVL3 (GOWN DISPOSABLE) ×9
KIT BASIN OR (CUSTOM PROCEDURE TRAY) ×3 IMPLANT
KIT ROOM TURNOVER OR (KITS) ×3 IMPLANT
LIQUID BAND (GAUZE/BANDAGES/DRESSINGS) ×4 IMPLANT
NDL 18GX1X1/2 (RX/OR ONLY) (NEEDLE) ×2 IMPLANT
NDL HYPO 25GX1X1/2 BEV (NEEDLE) ×2 IMPLANT
NEEDLE 18GX1X1/2 (RX/OR ONLY) (NEEDLE) ×3 IMPLANT
NEEDLE 22X1 1/2 (OR ONLY) (NEEDLE) ×3 IMPLANT
NEEDLE HYPO 25GX1X1/2 BEV (NEEDLE) ×3 IMPLANT
NS IRRIG 1000ML POUR BTL (IV SOLUTION) ×3 IMPLANT
PACK CV ACCESS (CUSTOM PROCEDURE TRAY) ×3 IMPLANT
PACK SURGICAL SETUP 50X90 (CUSTOM PROCEDURE TRAY) ×3 IMPLANT
PAD ARMBOARD 7.5X6 YLW CONV (MISCELLANEOUS) ×6 IMPLANT
SPONGE GAUZE 2X2 STER 10/PKG (GAUZE/BANDAGES/DRESSINGS) ×1
SPONGE SURGIFOAM ABS GEL 100 (HEMOSTASIS) IMPLANT
SUT ETHILON 3 0 PS 1 (SUTURE) ×3 IMPLANT
SUT PROLENE 6 0 BV (SUTURE) ×3 IMPLANT
SUT VIC AB 3-0 SH 27 (SUTURE) ×9
SUT VIC AB 3-0 SH 27X BRD (SUTURE) ×2 IMPLANT
SUT VICRYL 4-0 PS2 18IN ABS (SUTURE) ×4 IMPLANT
SYR 20CC LL (SYRINGE) ×6 IMPLANT
SYR 5ML LL (SYRINGE) ×6 IMPLANT
SYR CONTROL 10ML LL (SYRINGE) ×3 IMPLANT
SYRINGE 10CC LL (SYRINGE) ×3 IMPLANT
UNDERPAD 30X30 INCONTINENT (UNDERPADS AND DIAPERS) ×3 IMPLANT
WATER STERILE IRR 1000ML POUR (IV SOLUTION) ×3 IMPLANT

## 2015-11-26 NOTE — Discharge Instructions (Signed)
° ° °  11/26/2015 ZARIAHA GOENNER [id number] 10-12-1951  Surgeon(s): Angelia Mould, MD  Procedure(s): INSERTION OF DIALYSIS CATHETER  Creation of RIGHT BRACHIO-CEPHALIC arteriovenous fistula  x Do not stick graft for 12 weeks

## 2015-11-26 NOTE — Anesthesia Postprocedure Evaluation (Signed)
Anesthesia Post Note  Patient: [name]  Procedure(s) Performed: Procedure(s) (LRB): INSERTION OF DIALYSIS CATHETER (N/A)  Creation of RIGHT BRACHIO-CEPHALIC arteriovenous fistula (Right)  Patient location during evaluation: PACU Anesthesia Type: MAC Level of consciousness: awake and alert Pain management: pain level controlled Vital Signs Assessment: post-procedure vital signs reviewed and stable Respiratory status: spontaneous breathing, nonlabored ventilation and respiratory function stable Cardiovascular status: stable and blood pressure returned to baseline Anesthetic complications: no    Last Vitals:  Filed Vitals:   11/26/15 0945 11/26/15 1000  BP: 130/57 132/55  Pulse: 56 58  Temp:    Resp: 10 22    Last Pain:  Filed Vitals:   11/26/15 1007  PainSc: 0-No pain                 Trezure Cronk,W. EDMOND

## 2015-11-26 NOTE — H&P (View-Only) (Signed)
[facility] [place]  Patient name: [name] MRN: [id number] DOB: 22-Sep-1951 Sex: female  REASON FOR VISIT: to evaluate poorly maturing right radiocephalic AV fistula. Referred by Dr. Lowanda Foster.  HPI: [name] is a 64 y.o. female who is not yet on dialysis but is nearing the need for dialysis. She had a right radiocephalic AV fistula placed on 07/20/2015. The cephalic vein was 3 mm. I saw the patient in follow up on 09/01/2015. At that time, the diameters of fistula ranged from 0.54-0.57 cm. The fistula had an excellent thrill and I thought it would likely be adequate for access. However, she returns now and the fistula has not continued to mature. She has been admitted with some shortness of breath and may need dialysis sooner than later and we were asked to reevaluate her fistula. She denies any shortness of breath today or nausea or vomiting.  Past Medical History  Diagnosis Date  . Esophageal stricture   . Hyperlipidemia   . Hypertension   . Bronchitis   . CAD (coronary artery disease)     s/p Stenting in 2005;  [place] 7/09: Vigorous LV function, 2-3+ MR, RI 40%, RI stent patent, proximal-mid RCA 40-50%;  Echo 7/09:  EF 55-65%, trivial MR;  [place] 11/18/12: EF 66%, normal LV wall motion, mild to moderate anterior ischemia - reviewed by [facility] and felt to rep breast atten (low risk);  Echo 6/14: mild LVH, EF 55-65%, Gr 2 DD, PASP 44, trivial eff   . Arthritis   . IDDM (insulin dependent diabetes mellitus) ([place])   . Diverticulosis   . Diabetic neuropathy ([place])   . Vitamin D deficiency   . Degenerative joint disease     left shoulder  . Chronic neck pain   . Headache(784.0)   . Anemia     hx of  . Stroke [facility])     "mini- years ago"  . Chronic kidney disease     CREATININE IS UP--GOING TO KIDNEY MD   . CHF (congestive heart failure) ([place])   . Pericardial effusion 03/06/2015    Very small per echo ; pleural nodules seen on CT chest.  . Acute on chronic  diastolic CHF (congestive heart failure) ([place]) 03/05/2015    Grade 2. EF 60-65%  . COPD (chronic obstructive pulmonary disease) ([place])   . Peripheral vascular disease ([place])     Family History  Problem Relation Age of Onset  . Heart disease Other   . Hypertension Father   . Cancer Father   . Hypertension Daughter     SOCIAL HISTORY: Social History  Substance Use Topics  . Smoking status: Former Smoker -- 1.00 packs/day for 30 years    Quit date: 12/04/2001  . Smokeless tobacco: Never Used  . Alcohol Use: 3.6 oz/week    6 Cans of beer per week     Comment: occasional    Allergies  Allergen Reactions  . Clopidogrel Bisulfate Other (See Comments)    REACTION: Sick, Headache, "Felt terrible"  . Codeine Other (See Comments)    REACTION: hallucinations  . Ace Inhibitors Other (See Comments)    cough  . Lisinopril Other (See Comments)    cough  . Penicillins Other (See Comments)    unknown    Current Outpatient Prescriptions  Medication Sig Dispense Refill  . albuterol (PROVENTIL HFA;VENTOLIN HFA) 108 (90 BASE) MCG/ACT inhaler Inhale 2 puffs into the lungs every 6 (six) hours as needed for wheezing or shortness of  breath. 1 Inhaler 2  . albuterol (PROVENTIL) (2.5 MG/3ML) 0.083% nebulizer solution Take 3 mLs (2.5 mg total) by nebulization every 6 (six) hours as needed for wheezing or shortness of breath. 75 mL 12  . aspirin 81 MG EC tablet Take 81 mg by mouth daily.      [name] atenolol (TENORMIN) 50 MG tablet Take 1 tablet (50 mg total) by mouth daily. 60 tablet 5  . budesonide-formoterol (SYMBICORT) 160-4.5 MCG/ACT inhaler Inhale 2 puffs into the lungs 2 (two) times daily. 1 Inhaler 12  . calcitRIOL (ROCALTROL) 0.25 MCG capsule Take 0.25 mcg by mouth daily.    . Carboxymethylcellulose Sodium (THERATEARS OP) Place 2 drops into both eyes as needed (for dry eyes).    . cholecalciferol (VITAMIN D) 1000 UNITS tablet Take 1,000 Units by mouth every morning.    . cloNIDine (CATAPRES) 0.1  MG tablet Take 0.1 mg by mouth 2 (two) times daily.    [name] docusate sodium (COLACE) 100 MG capsule Take 100 mg by mouth 2 (two) times daily.    [name] gabapentin (NEURONTIN) 300 MG capsule Take 1 capsule (300 mg total) by mouth at bedtime. DOSE DECREASED TO ONCE DAILY-AT BEDTIME BECAUSE OF YOUR KIDNEY FUNCTION    . glucose blood test strip Test 4 times daily 120 each 5  . hydrALAZINE (APRESOLINE) 50 MG tablet Take 2 tablets (100 mg total) by mouth 2 (two) times daily. New medicine for high blood pressure 60 tablet 3  . insulin lispro (HUMALOG) 100 UNIT/ML injection Inject 0.1-0.13 mLs (10-13 Units total) into the skin 3 (three) times daily with meals. To be administered as directed per sliding scale: 90-150= 10  Units 151-200=11 units 201-250=12 units 251-300= 13 units  To call MD if levels are between 251-300 for three consecutive days 10 mL 1  . LANTUS SOLOSTAR 100 UNIT/ML Solostar Pen INJECT 72 UNITS SUBCUTANEOUSLY AT 10PM. (Patient taking differently: 30 units at bedtime) 15 mL 3  . NIFEdipine (PROCARDIA XL/ADALAT-CC) 90 MG 24 hr tablet Take 90 mg by mouth every evening.     . nitroGLYCERIN (NITROSTAT) 0.4 MG SL tablet Place 1 tablet (0.4 mg total) under the tongue every 5 (five) minutes as needed for chest pain. 25 tablet 5  . pantoprazole (PROTONIX) 40 MG tablet Take 1 tablet (40 mg total) by mouth 2 (two) times daily. For gastric acid reflux. 60 tablet 3  . polyethylene glycol (MIRALAX / GLYCOLAX) packet Take 17 g by mouth daily.    . potassium chloride (K-DUR) 10 MEQ tablet Take 20 mEq by mouth every other day.     . rosuvastatin (CRESTOR) 20 MG tablet TAKE 1 TABLET IN THE EVENING. 30 tablet 5  . torsemide (DEMADEX) 20 MG tablet Take 2 tablets (40 mg total) by mouth daily. New medicine to reduce fluid build up. (Patient taking differently: Take 40 mg by mouth daily. Take 20 mg extra tablet if weight is over 3-4 pounds.) 60 tablet 3  . Linaclotide (LINZESS) 290 MCG CAPS capsule Take 1 capsule (290  mcg total) by mouth daily. (Patient not taking: Reported on 11/03/2015) 30 capsule 2   No current facility-administered medications for this visit.    REVIEW OF SYSTEMS:  [X]  denotes positive finding, [ ]  denotes negative finding Cardiac  Comments:  Chest pain or chest pressure: X   Shortness of breath upon exertion: X   Short of breath when lying flat: X   Irregular heart rhythm:        Vascular  Pain in calf, thigh, or hip brought on by ambulation:    Pain in feet at night that wakes you up from your sleep:     Blood clot in your veins:    Leg swelling:         Pulmonary    Oxygen at home:    Productive cough:     Wheezing:  X       Neurologic    Sudden weakness in arms or legs:     Sudden numbness in arms or legs:     Sudden onset of difficulty speaking or slurred speech:    Temporary loss of vision in one eye:     Problems with dizziness:         Gastrointestinal    Blood in stool:     Vomited blood:         Genitourinary    Burning when urinating:     Blood in urine:        Psychiatric    Major depression:         Hematologic    Bleeding problems:    Problems with blood clotting too easily:        Skin    Rashes or ulcers:        Constitutional    Fever or chills:      PHYSICAL EXAM: Filed Vitals:   11/03/15 1244 11/03/15 1246  BP: 150/66 152/62  Pulse: 56 56  Temp: 97.9 F (36.6 C)   TempSrc: Oral   Resp: 18   Height: 5\' 2"  (1.575 m)   Weight: 200 lb (90.719 kg)   SpO2: 98%     GENERAL: The patient is a well-nourished female, in no acute distress. The vital signs are documented above. CARDIAC: There is a regular rate and rhythm.  VASCULAR: her right radiocephalic fistula has a good thrill although the vein is somewhat small. She has a palpable right radial pulse. PULMONARY: There is good air exchange bilaterally without wheezing or rales. NEUROLOGIC: No focal weakness or paresthesias are detected. SKIN: There are no ulcers or rashes  noted. PSYCHIATRIC: The patient has a normal affect.  DATA:  I have independently interpreted her right upper extremity dialysis duplex. The radiocephalic fistula has multiple areas of narrowing within the fistula and is not adequate in size in the forearm. The upper arm cephalic vein looks reasonable in size.  MEDICAL ISSUES:  STAGE IV CHRONIC KIDNEY DISEASE: I reviewed her duplex today which shows the diameters of the fistula are not adequate in the forearm. Diameters range from 0.26-0.43 cm. There are also multiple areas of stenosis within the fistula. I have discussed our options with her. I have offered her the option of proceeding with a fistulogram with limited contrast to see if there is anything that can be done to improve the maturation of her right radiocephalic fistula. This would include possible angioplasty of a vein graft stenosis. If this were not possible this would help [place] determine if there were any surgical options for revision. I've explained that I think the chances are unlikely that we will be able to salvage the fistula as there appeared to be multiple areas of narrowing in the vein is simply not maturing adequately. I have also offered her the option of proceeding with placement of a new right brachiocephalic fistula as this vein looks to be larger in size. It too was small in some areas however. If she needs dialysis in the near future we  could certainly place a catheter at the same time. After much discussion she would like to proceed with a fistulogram and this has been scheduled for 11/22/2015.   Deitra Mayo [facility] [place]: [phone]

## 2015-11-26 NOTE — Anesthesia Procedure Notes (Signed)
Procedure Name: MAC Date/Time: 11/26/2015 7:35 AM Performed by: Tressia Miners LEFFEW Pre-anesthesia Checklist: Patient identified, Emergency Drugs available, Suction available, Timeout performed and Patient being monitored Patient Re-evaluated:Patient Re-evaluated prior to inductionOxygen Delivery Method: Nasal cannula Placement Confirmation: positive ETCO2

## 2015-11-26 NOTE — Telephone Encounter (Signed)
-----   Message from Mena Goes, RN sent at 11/26/2015 10:17 AM EST ----- Regarding: schedule   ----- Message -----    From: Alvia Grove, PA-C    Sent: 11/26/2015   9:14 AM      To: Vvs Charge Pool  S/p right brachial-cephalic AVF [id number]  F/u in 6 weeks with CSD and duplex  Thanks Maudie Mercury

## 2015-11-26 NOTE — Op Note (Signed)
    NAME: [name]   MRN: [id number] DOB: 06-19-1951    DATE OF OPERATION: 11/26/2015  PREOP DIAGNOSIS: Stage IV chronic kidney disease  POSTOP DIAGNOSIS: Same  PROCEDURE:  1. Ultrasound-guided placement of right IJ tunneled dialysis catheter 2. Right brachiocephalic AV fistula 3. Ligation of right radial cephalic AV fistula  SURGEON: Judeth Cornfield. Scot Dock, MD, FACS  ASSIST: Silva Bandy, [facility]  ANESTHESIA: local with sedation   EBL: minimal  INDICATIONS: [name] is a 64 y.o. female who is to begin dialysis in. I was asked to place a catheter. Her forearm fistula was not maturing adequately and she had a long segment stenosis in the proximal fistula. I felt that a new upper arm fistula was indicated.  FINDINGS: 4.5 mm upper arm cephalic vein.  TECHNIQUE:  The patient was taken to the operative room and sedated by anesthesia. The neck and upper chest were prepped and draped in the usual sterile fashion. Under ultrasound guidance, after the skin was anesthetized, the right IJ was cannulated and a guidewire placed into the right atrium under fluoroscopic control. The tract over the wire was dilated and then a dilator and peel-away sheath were advanced over the wire and the wire and dilator removed. The catheter was passed through the peel-away sheath and positioned in the right atrium. The exit site for the catheter was selected and the skin anesthetized between the 2 areas. He was brought to the tunnel cut to the appropriate length and the distal ports were attached. Both ports withdrew easily with and flushed with heparin saline instilled concentrated heparin. The catheter was secured at its exit site with a 3-0 nylon suture. The IJ cannulation site was closed with a 4-0 subcuticular stitch. Sterile dressing was applied.  Attention was then turned to the right arm. The right upper extremity was prepped and draped in usual sterile fashion. After the skin was anesthetized with 1%  lidocaine, a transverse incision was made just above the antecubital level. Here the cephalic vein was dissected free and ligated distally. The brachial artery was dissected free beneath the fascia. The patient was heparinized. The brachial artery was clamped proximally and distally and a longitudinal arteriotomy was made. The vein was sewn end to side to the artery using continuous 6-0 Prolene suture. At the completion was an excellent thrill in the fistula.  After the skin was anesthetized with 1% lidocaine, the incision at the wrist was opened. Here the radiocephalic fistula was then a 5 and ligated with a 2-0 silk tie. This incision was closed with a deep layer 3-0 Vicryl and the skin closed with 4-0 Vicryl. Hemostasis was obtained in the antecubital incision and this was closed with a deeper 3-0 Vicryl and the skin closed with 4-0 Vicryl. The band was applied. The patient tolerated the procedure well and was transferred to the recovery room in stable condition. All needle and sponge counts were correct.  Deitra Mayo, MD, FACS [facility]  DATE OF DICTATION:   11/26/2015

## 2015-11-26 NOTE — Interval H&P Note (Signed)
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 After consideration of risks, benefits and other options for treatment, the patient has consented to  Procedure(s): INSERTION OF DIALYSIS CATHETER (N/A) ARTERIOVENOUS (AV) FISTULA CREATION- RIGHT BRACHIO-CEPHALIC VERSES RIGHT BASILIC VEIN TRANSPOSITION VERSES AV GRAFT (Right) as a surgical intervention .  [name] history has been reviewed, patient examined, no change in status, stable for surgery.  I have reviewed the patient's chart and labs.  Questions were answered to the patient's satisfaction.     Deitra Mayo

## 2015-11-26 NOTE — Anesthesia Preprocedure Evaluation (Addendum)
Anesthesia Evaluation  Patient identified by MRN, date of birth, ID band Patient awake    Reviewed: Allergy & Precautions, H&P , NPO status , Patient's Chart, lab work & pertinent test results, reviewed documented beta blocker date and time   Airway Mallampati: II  TM Distance: >3 FB Neck ROM: Full    Dental no notable dental hx. (+) Upper Dentures, Dental Advisory Given   Pulmonary COPD,  COPD inhaler, former smoker,    Pulmonary exam normal breath sounds clear to auscultation       Cardiovascular hypertension, Pt. on medications and Pt. on home beta blockers + CAD, + Cardiac Stents, + Peripheral Vascular Disease and +CHF   Rhythm:Regular Rate:Normal     Neuro/Psych Anxiety CVA negative neurological ROS  negative psych ROS   GI/Hepatic Neg liver ROS, GERD  Medicated,  Endo/Other  diabetes, Insulin DependentMorbid obesity  Renal/GU ESRF and DialysisRenal disease  negative genitourinary   Musculoskeletal  (+) Arthritis , Osteoarthritis,    Abdominal   Peds  Hematology negative hematology ROS (+)   Anesthesia Other Findings   Reproductive/Obstetrics negative OB ROS                            Anesthesia Physical Anesthesia Plan  ASA: III  Anesthesia Plan: MAC   Post-op Pain Management:    Induction: Intravenous  Airway Management Planned: Simple Face Mask  Additional Equipment:   Intra-op Plan:   Post-operative Plan:   Informed Consent: I have reviewed the patients History and Physical, chart, labs and discussed the procedure including the risks, benefits and alternatives for the proposed anesthesia with the patient or authorized representative who has indicated his/her understanding and acceptance.   Dental advisory given  Plan Discussed with: CRNA  Anesthesia Plan Comments:         Anesthesia Quick Evaluation

## 2015-11-26 NOTE — Telephone Encounter (Signed)
Spoke with pt, dpm °

## 2015-11-26 NOTE — Transfer of Care (Signed)
Immediate Anesthesia Transfer of Care Note  Patient: [name]  Procedure(s) Performed: Procedure(s): INSERTION OF DIALYSIS CATHETER (N/A)  Creation of RIGHT BRACHIO-CEPHALIC arteriovenous fistula (Right)  Patient Location: PACU  Anesthesia Type:MAC  Level of Consciousness: awake, alert , patient cooperative and responds to stimulation  Airway & Oxygen Therapy: Patient Spontanous Breathing and Patient connected to face mask oxygen  Post-op Assessment: Report given to RN, Post -op Vital signs reviewed and stable and Patient moving all extremities X 4  Post vital signs: Reviewed and stable  Last Vitals:  Filed Vitals:   11/26/15 0604 11/26/15 0916  BP: 137/53   Pulse: 56   Temp: 36.5 C 36.5 C  Resp: 18     Complications: No apparent anesthesia complications

## 2015-11-30 ENCOUNTER — Encounter (HOSPITAL_COMMUNITY): Payer: Self-pay | Admitting: Vascular Surgery

## 2015-12-02 ENCOUNTER — Encounter: Payer: Self-pay | Admitting: *Deleted

## 2015-12-03 NOTE — Telephone Encounter (Signed)
Printed shots off ncir and faxed to [employer]

## 2015-12-09 ENCOUNTER — Encounter: Payer: Self-pay | Admitting: Pediatrics

## 2015-12-09 ENCOUNTER — Ambulatory Visit (INDEPENDENT_AMBULATORY_CARE_PROVIDER_SITE_OTHER): Payer: Medicare Other | Admitting: Pediatrics

## 2015-12-09 VITALS — BP 132/65 | Temp 97.2°F | Ht 63.0 in | Wt 196.8 lb

## 2015-12-09 DIAGNOSIS — K5901 Slow transit constipation: Secondary | ICD-10-CM

## 2015-12-09 DIAGNOSIS — E1121 Type 2 diabetes mellitus with diabetic nephropathy: Secondary | ICD-10-CM | POA: Diagnosis not present

## 2015-12-09 DIAGNOSIS — I10 Essential (primary) hypertension: Secondary | ICD-10-CM | POA: Diagnosis not present

## 2015-12-09 MED ORDER — INSULIN LISPRO 100 UNIT/ML ~~LOC~~ SOLN: 10.0000 [IU] | Freq: Three times a day (TID) | SUBCUTANEOUS | Status: DC

## 2015-12-09 MED ORDER — HYDRALAZINE HCL 50 MG PO TABS: 50.0000 mg | ORAL_TABLET | Freq: Two times a day (BID) | ORAL | Status: DC

## 2015-12-09 MED ORDER — LACTULOSE 10 GM/15ML PO SOLN: 10.0000 g | Freq: Two times a day (BID) | ORAL | Status: DC | PRN

## 2015-12-09 NOTE — Patient Instructions (Addendum)
Lantus to 20units  5 units of humalog with meals  Lactulose if no stool in 24 hours

## 2015-12-09 NOTE — Progress Notes (Signed)
Subjective:    Patient ID: [name], female    DOB: [birth year], 65 y.o.   MRN: [id number]  CC: Follow-up multiple med problems  HPI: [name] is a 65 y.o. female presenting for Follow-up  Feels like SOB is much better since starting dialysis If she exerts herself too much she does get SOB and tired and needs to rest Still having some low BGLs Was put on prednisone apprx 3 weeks ago for COPD exacerbation BGLs went high but now are back to below 100 in the morning for the last few days Started dialysis 2 weeks ago through R upper chest cath. Has new fistula placed R upper arm that is maturing. Says dialysis is going well, goes T,R,Sat in [place] Swelling much better LE.    Depression screen [facility] 2/9 12/09/2015 11/08/2015 10/08/2015 09/23/2015 08/16/2015  Decreased Interest 0 0 0 0 0  Down, Depressed, Hopeless 0 0 0 0 0  PHQ - 2 Score 0 0 0 0 0     Relevant past medical, surgical, family and social history reviewed and updated as indicated. Interim medical history since our last visit reviewed. Allergies and medications reviewed and updated.    ROS: Per HPI unless specifically indicated above  History  Smoking status  . Former Smoker -- 1.00 packs/day for 30 years  . Quit date: 12/04/2001  Smokeless tobacco  . Never Used    Past Medical History Patient Active Problem List   Diagnosis Date Noted  . Chronic kidney disease (CKD), stage IV (severe) ([place]) 11/22/2015  . Constipation 10/25/2015  . Pleural nodules 03/08/2015  . Bronchitis, chronic obstructive w acute bronchitis ([place]) 03/06/2015  . Chronic kidney disease, stage IV (severe) ([place]) 03/06/2015  . Morbid obesity ([place]) 03/06/2015  . Pericardial effusion 03/06/2015  . Acute on chronic diastolic CHF (congestive heart failure) ([place]) 03/05/2015  . PAD (peripheral artery disease) ([place]) 04/21/2014  . Leukocytosis 02/02/2014  . Hypokalemia 09/30/2013  . Dyspnea 09/29/2013  . Abnormal chest x-ray 09/29/2013    . Acute diastolic CHF (congestive heart failure) ([place]) 09/29/2013  . Type 2 diabetes mellitus with hypoglycemia ([place]) 01/02/2012  . CKD (chronic kidney disease) 01/02/2012  . Diverticulosis 03/29/2011  . Diabetic neuropathy ([place]) 03/29/2011  . Esophageal stricture 03/29/2011  . Vitamin D deficiency 03/29/2011  . Degenerative joint disease of left shoulder 03/29/2011  . Neck pain 03/29/2011  . Type II diabetes mellitus with nephropathy ([place]) 04/19/2009  . Hyperlipidemia 04/19/2009  . HYPERTENSION, BENIGN 04/19/2009  . Coronary atherosclerosis 04/19/2009       Objective:    BP 132/65 mmHg  Temp(Src) 97.2 F (36.2 C) (Oral)  Ht 5\' 3"  (1.6 m)  Wt 196 lb 12.8 oz (89.268 kg)  BMI 34.87 kg/m2  Wt Readings from Last 3 Encounters:  12/09/15 196 lb 12.8 oz (89.268 kg)  11/26/15 197 lb (89.359 kg)  11/22/15 197 lb (89.359 kg)     Gen: NAD, alert, cooperative with exam, NCAT EYES: no scleral injection or icterus ENT:   OP without erythema LYMPH: no cervical LAD CV: NRRR, normal S1/S2,  distal pulses 2+ b/l Resp: CTABL, no wheezes, normal WOB Abd: +BS, soft, NTND. Ext: No edema, warm Neuro: Alert and oriented     Assessment & Plan:    [name] was seen today for follow-up multiple medical problems.   Diagnoses and all orders for this visit:  HYPERTENSION, BENIGN Well controlled today. Continue current meds. On HD T/R/Sat  Type II diabetes mellitus with nephropathy (  [place]) Continues to have low am BGLs, decrease lantus to 20 units at night. Take 5 u humalog with meals Cont to check AM values  Slow transit constipation Symptoms improved with below, continue -     lactulose (CHRONULAC) 10 GM/15ML solution; Take 15 mLs (10 g total) by mouth 2 (two) times daily as needed for mild constipation.  Follow up plan: Return in about 4 weeks (around 01/06/2016).  [name], MD [facility] 12/09/2015, 8:21 PM

## 2015-12-10 DIAGNOSIS — Z992 Dependence on renal dialysis: Secondary | ICD-10-CM | POA: Insufficient documentation

## 2015-12-10 DIAGNOSIS — M9080 Osteopathy in diseases classified elsewhere, unspecified site: Secondary | ICD-10-CM | POA: Insufficient documentation

## 2015-12-10 DIAGNOSIS — D638 Anemia in other chronic diseases classified elsewhere: Secondary | ICD-10-CM | POA: Insufficient documentation

## 2015-12-10 DIAGNOSIS — N189 Chronic kidney disease, unspecified: Secondary | ICD-10-CM

## 2015-12-10 DIAGNOSIS — N186 End stage renal disease: Secondary | ICD-10-CM | POA: Insufficient documentation

## 2015-12-10 DIAGNOSIS — D631 Anemia in chronic kidney disease: Secondary | ICD-10-CM | POA: Insufficient documentation

## 2015-12-14 ENCOUNTER — Inpatient Hospital Stay: Admission: RE | Admit: 2015-12-14 | Payer: Medicare Other | Source: Ambulatory Visit

## 2015-12-15 ENCOUNTER — Ambulatory Visit (INDEPENDENT_AMBULATORY_CARE_PROVIDER_SITE_OTHER)
Admission: RE | Admit: 2015-12-15 | Discharge: 2015-12-15 | Disposition: A | Discharge status: Home / Self Care | Payer: Medicare Other | Source: Ambulatory Visit | Attending: Cardiology | Admitting: Cardiology

## 2015-12-15 DIAGNOSIS — R911 Solitary pulmonary nodule: Secondary | ICD-10-CM

## 2015-12-29 ENCOUNTER — Ambulatory Visit (INDEPENDENT_AMBULATORY_CARE_PROVIDER_SITE_OTHER): Payer: Medicare Other | Admitting: Cardiology

## 2015-12-29 ENCOUNTER — Encounter: Payer: Self-pay | Admitting: Cardiology

## 2015-12-29 VITALS — BP 134/75 | HR 65 | Ht 63.0 in | Wt 198.0 lb

## 2015-12-29 DIAGNOSIS — R0600 Dyspnea, unspecified: Secondary | ICD-10-CM | POA: Diagnosis not present

## 2015-12-29 NOTE — Progress Notes (Signed)
HPI The patient presents for follow up of CAD and diastolic heart failure.  Prior to the last visit she had been at [facility] with diastolic dysfunction. I was able to review these records. In particular looking for any evidence that she might of had a suggestion of ischemia but she did not during that hospital visit.   Of note she had a follow-up CT in January and previous nodules were resolved. Since I last saw her she has started dialysis. She says that she's been getting some discomfort with dialysis it lasts for a little while after she gets home. She feels a heaviness. It goes away and she doesn't have until the next dialysis session. She still doing her activities of daily living she's not getting any chest pressure, neck or arm discomfort.  She's not having any palpitations, presyncope or syncope.  She does have mild leg edema.  Allergies  Allergen Reactions  . Ace Inhibitors Cough  . Clopidogrel Bisulfate Other (See Comments)    REACTION: Sick, Headache, "Felt terrible"  . Codeine Other (See Comments)    REACTION: hallucinations  . Lisinopril Other (See Comments)    cough  . Penicillins Other (See Comments)    Has patient had a PCN reaction causing immediate rash, facial/tongue/throat swelling, SOB or lightheadedness with hypotension: unknown Has patient had a PCN reaction causing severe rash involving mucus membranes or skin necrosis: unknown Has patient had a PCN reaction that required hospitalization unknown Has patient had a PCN reaction occurring within the last 10 years:unknown If all of the above answers are "NO", then may proceed with Cephalosporin use.     Current Outpatient Prescriptions  Medication Sig Dispense Refill  . albuterol (PROVENTIL HFA;VENTOLIN HFA) 108 (90 BASE) MCG/ACT inhaler Inhale 2 puffs into the lungs every 6 (six) hours as needed for wheezing or shortness of breath. 1 Inhaler 2  . albuterol (PROVENTIL) (2.5 MG/3ML) 0.083% nebulizer  solution Take 3 mLs (2.5 mg total) by nebulization every 6 (six) hours as needed for wheezing or shortness of breath. 75 mL 12  . aspirin 81 MG EC tablet Take 81 mg by mouth daily.      [name] atenolol (TENORMIN) 50 MG tablet Take 1 tablet (50 mg total) by mouth daily. 60 tablet 5  . budesonide-formoterol (SYMBICORT) 160-4.5 MCG/ACT inhaler Inhale 2 puffs into the lungs 2 (two) times daily. 1 Inhaler 12  . Carboxymethylcellulose Sodium (THERATEARS OP) Place 2 drops into both eyes as needed (for dry eyes). Reported on 12/09/2015    . cloNIDine (CATAPRES) 0.1 MG tablet Take 0.1 mg by mouth 2 (two) times daily.    [name] docusate sodium (COLACE) 100 MG capsule Take 1 capsule (100 mg total) by mouth 2 (two) times daily. 60 capsule 4  . fluticasone (FLONASE) 50 MCG/ACT nasal spray Place 2 sprays into both nostrils daily.  98  . gabapentin (NEURONTIN) 300 MG capsule Take 1 capsule (300 mg total) by mouth at bedtime. DOSE DECREASED TO ONCE DAILY-AT BEDTIME BECAUSE OF YOUR KIDNEY FUNCTION (Patient taking differently: Take 300 mg by mouth at bedtime. )    . hydrALAZINE (APRESOLINE) 50 MG tablet Take 1 tablet (50 mg total) by mouth 2 (two) times daily. 1 tablet 0  . insulin glargine (LANTUS) 100 UNIT/ML injection Inject 20 Units into the skin at bedtime.    . insulin lispro (HUMALOG) 100 UNIT/ML injection Inject 0.1-0.13 mLs (10-13 Units total) into the skin 3 (three) times daily with meals. To be administered  as directed per sliding scale: 151-200=2 units 201-250=4 units 251-300= 6 units  To call MD if levels are between 251-300 for three consecutive days 10 mL 0  . isosorbide mononitrate (IMDUR) 60 MG 24 hr tablet Take 1 tablet (60 mg total) by mouth daily. 30 tablet 9  . lactulose (CHRONULAC) 10 GM/15ML solution Take 15 mLs (10 g total) by mouth 2 (two) times daily as needed for mild constipation. 240 mL 4  . LORazepam (ATIVAN) 0.5 MG tablet Take 1 tablet (0.5 mg total) by mouth every 8 (eight) hours. 30 tablet 2  .  NIFEdipine (PROCARDIA XL/ADALAT-CC) 90 MG 24 hr tablet Take 90 mg by mouth every evening.     . nitroGLYCERIN (NITROSTAT) 0.4 MG SL tablet Place 1 tablet (0.4 mg total) under the tongue every 5 (five) minutes as needed for chest pain. 25 tablet 5  . oxyCODONE-acetaminophen (ROXICET) 5-325 MG tablet Take 1-2 tablets by mouth every 6 (six) hours as needed. 20 tablet 0  . pantoprazole (PROTONIX) 40 MG tablet Take 1 tablet (40 mg total) by mouth 2 (two) times daily. For gastric acid reflux. 60 tablet 3  . polyethylene glycol (MIRALAX / GLYCOLAX) packet Take 17 g by mouth daily as needed for mild constipation or moderate constipation.     . rosuvastatin (CRESTOR) 20 MG tablet TAKE 1 TABLET IN THE EVENING. 30 tablet 5  . torsemide (DEMADEX) 20 MG tablet Take 2 tablets (40 mg total) by mouth daily. New medicine to reduce fluid build up. (Patient taking differently: Take 40-60 mg by mouth daily. **Take 20 mg extra tablet (60mg  total) if weight is over 3-4 pounds.) 60 tablet 3  . glucose blood test strip Test 4 times daily 120 each 5   No current facility-administered medications for this visit.    Past Medical History  Diagnosis Date  . Esophageal stricture   . Hyperlipidemia   . Hypertension   . Bronchitis   . CAD (coronary artery disease)     s/p Stenting in 2005;  [place] 7/09: Vigorous LV function, 2-3+ MR, RI 40%, RI stent patent, proximal-mid RCA 40-50%;  [place] 7/09:  EF 55-65%, trivial MR;  [place] 11/18/12: EF 66%, normal LV wall motion, mild to moderate anterior ischemia - reviewed by [facility] and felt to rep breast atten (low risk);  Echo 6/14: mild LVH, EF 55-65%, Gr 2 DD, PASP 44, trivial eff   . Arthritis   . IDDM (insulin dependent diabetes mellitus) ([place])   . Diverticulosis   . Diabetic neuropathy ([place])   . Vitamin D deficiency   . Degenerative joint disease     left shoulder  . Chronic neck pain   . Anemia     hx of  . Stroke [facility])     "mini- years ago"  . Chronic kidney disease      CREATININE IS UP--GOING TO KIDNEY MD   . CHF (congestive heart failure) ([place])   . Pericardial effusion 03/06/2015    Very small per echo ; pleural nodules seen on CT chest.  . Acute on chronic diastolic CHF (congestive heart failure) ([place]) 03/05/2015    Grade 2. EF 60-65%  . COPD (chronic obstructive pulmonary disease) ([place])   . Peripheral vascular disease ([place])   . Pneumonia   . Anxiety   . GERD (gastroesophageal reflux disease)   . Constipation     Past Surgical History  Procedure Laterality Date  . Bladder surgery      Tack  . Partial hysterectomy    .  Shoulder surgery Right     Rotator cuff  . Cervical spine surgery  2012    8 screws  . Abdominal hysterectomy    . Laminectomy  12/29/2011  . Cardiac stents    . Lumbar laminectomy/decompression microdiscectomy  12/29/2011    Procedure: LUMBAR LAMINECTOMY/DECOMPRESSION MICRODISCECTOMY;  Surgeon: Floyce Stakes, MD;  Location: [facility];  Service: Neurosurgery;  Laterality: N/A;  Lumbar Two through Lumbar Five Laminectomies/Cellsaver  . Back surgery      2007 & 2013  . Cystoscopy with injection N/A 06/03/2013    Procedure: MACROPLASTIQUE  INJECTION ;  Surgeon: Malka So, MD;  Location: [facility];  Service: Urology;  Laterality: N/A;  . Cardiac catheterization  2003, 2005, 2009    1 stent  . Cataract extraction w/phaco Right 05/04/2014    Procedure: CATARACT EXTRACTION PHACO AND INTRAOCULAR LENS PLACEMENT (IOC);  Surgeon: Williams Che, MD;  Location: [facility];  Service: Ophthalmology;  Laterality: Right;  CDE 1.47  . Cataract extraction w/phaco Left 07/20/2014    Procedure: CATARACT EXTRACTION WITH PHACO AND INTRAOCULAR LENS PLACEMENT LEFT EYE CDE=1.79;  Surgeon: Williams Che, MD;  Location: [facility];  Service: Ophthalmology;  Laterality: Left;  . Av fistula placement Right 07/20/2015    Procedure: RADIAL CEPHALIC ARTERIOVENOUS FISTULA CREATION RIGHT ARM;  Surgeon: Angelia Mould, MD;  Location: [place];  Service: Vascular;   Laterality: Right;  . Peripheral vascular catheterization N/A 11/22/2015    Procedure: Fistulagram;  Surgeon: Angelia Mould, MD;  Location: [facility];  Service: Cardiovascular;  Laterality: N/A;  . Insertion of dialysis catheter N/A 11/26/2015    Procedure: INSERTION OF DIALYSIS CATHETER;  Surgeon: Angelia Mould, MD;  Location: [facility];  Service: Vascular;  Laterality: N/A;  . Av fistula placement Right 11/26/2015    Procedure:  Creation of RIGHT BRACHIO-CEPHALIC arteriovenous fistula;  Surgeon: Angelia Mould, MD;  Location: [facility] OR;  Service: Vascular;  Laterality: Right;    ROS:  Positive for insomnia. Otherwise as stated in the HPI and negative for all other systems.  PHYSICAL EXAM BP 134/75 mmHg  Pulse 65  Ht 5\' 3"  (1.6 m)  Wt 198 lb (89.812 kg)  BMI 35.08 kg/m2 GENERAL:  Well appearing HEENT:  Pupils equal round and reactive, fundi not visualized, oral mucosa unremarkable NECK:  No jugular venous distention, waveform within normal limits, carotid upstroke brisk and symmetric, no bruits, no thyromegaly LYMPHATICS:  No cervical, inguinal adenopathy LUNGS:  Clear to auscultation bilaterally BACK:  No CVA tenderness CHEST:  Unremarkable HEART:  PMI not displaced or sustained,S1 and S2 within normal limits, no S3, no S4, no clicks, no rubs, no murmurs ABD:  Flat, positive bowel sounds normal in frequency in pitch, no bruits, no rebound, no guarding, no midline pulsatile mass, no hepatomegaly, no splenomegaly EXT:  2 plus pulses upper, decreased dorsalis pedis and posterior tibialis bilaterally, mild bilateral lower extremity edema, no cyanosis no clubbing, right upper arm thrill and bruit SKIN:  No rashes no nodules, keloids  EKG :  Normal sinus rhythm, rate 65, axis within normal limits, intervals within normal limits, poor anterior R wave progression, premature atrial contractions, no acute ST-T wave changes.  12/29/2015  ASSESSMENT AND PLAN   CAD -    She does have some chest discomfort. I'm going to screen her with a dobutamine echocardiogram. Further evaluation will be based on this.  HYPERTENSION, BENIGN -  I started Imdur at the last visit for  blood pressure but will keep this going now he'll exclude ischemia.  DYSLIPIDEMIA -   I will follow-up in the future with a lipid profile.  CHRONIC DIASTOLIC - Her volume is now being managed with dialysis.  ABNORMAL CT - This was followed up in January as above.  CKD - As above

## 2015-12-29 NOTE — Patient Instructions (Signed)
Medication Instructions:  The current medical regimen is effective;  continue present plan and medications.  Testing/Procedures: Your physician has requested that you have a stress echocardiogram. For further information please visit [email or web address]. Please follow instruction sheet as given.  Follow-Up: Follow up in 2 months with Dr Percival Spanish in [place].  If you need a refill on your cardiac medications before your next appointment, please call your pharmacy.  Thank you for choosing [facility]!!

## 2015-12-31 ENCOUNTER — Other Ambulatory Visit: Payer: Self-pay | Admitting: *Deleted

## 2015-12-31 MED ORDER — LORAZEPAM 0.5 MG PO TABS: 0.5000 mg | ORAL_TABLET | Freq: Three times a day (TID) | ORAL | Status: DC

## 2016-01-07 ENCOUNTER — Encounter: Payer: Self-pay | Admitting: Pediatrics

## 2016-01-07 ENCOUNTER — Encounter: Payer: Self-pay | Admitting: Vascular Surgery

## 2016-01-07 ENCOUNTER — Ambulatory Visit (INDEPENDENT_AMBULATORY_CARE_PROVIDER_SITE_OTHER): Payer: Medicare Other | Admitting: Pediatrics

## 2016-01-07 VITALS — BP 148/59 | HR 61 | Temp 97.1°F | Ht 63.0 in | Wt 197.0 lb

## 2016-01-07 DIAGNOSIS — R0789 Other chest pain: Secondary | ICD-10-CM | POA: Diagnosis not present

## 2016-01-07 DIAGNOSIS — E785 Hyperlipidemia, unspecified: Secondary | ICD-10-CM

## 2016-01-07 DIAGNOSIS — M79606 Pain in leg, unspecified: Secondary | ICD-10-CM | POA: Diagnosis not present

## 2016-01-07 DIAGNOSIS — E1121 Type 2 diabetes mellitus with diabetic nephropathy: Secondary | ICD-10-CM | POA: Diagnosis not present

## 2016-01-07 DIAGNOSIS — I10 Essential (primary) hypertension: Secondary | ICD-10-CM

## 2016-01-07 MED ORDER — INSULIN GLARGINE 100 UNIT/ML SOLOSTAR PEN: 25.0000 [IU] | PEN_INJECTOR | Freq: Every day | SUBCUTANEOUS | Status: DC

## 2016-01-07 MED ORDER — ROSUVASTATIN CALCIUM 10 MG PO TABS: 10.0000 mg | ORAL_TABLET | Freq: Every day | ORAL | Status: DC

## 2016-01-07 MED ORDER — INSULIN LISPRO 100 UNIT/ML ~~LOC~~ SOLN: 10.0000 [IU] | Freq: Three times a day (TID) | SUBCUTANEOUS | Status: DC

## 2016-01-07 NOTE — Progress Notes (Signed)
Subjective:    Patient ID: [name], female    DOB: 1951/05/25, 65 y.o.   MRN: [id number]  CC: Follow-up multiple med problems  HPI: [name] is a 65 y.o. female presenting for Follow-up  DM2: has had BGLs in the morning around 130-150 while taking lantus 25u at night. Lowest of 85  Having some SOB during dialysis; Has stress test scheduled for later this month  ESRD: going to dialysis regularly  HTN: high when she goes to dialysis bc she is supposed to skip her BP meds. Goes up to 190s-200s by the end of dialysis and has SOB and chest pressure  Depression screen [facility] 2/9 01/07/2016 12/09/2015 11/08/2015 10/08/2015 09/23/2015  Decreased Interest 0 0 0 0 0  Down, Depressed, Hopeless 0 0 0 0 0  PHQ - 2 Score 0 0 0 0 0     Relevant past medical, surgical, family and social history reviewed and updated as indicated. Interim medical history since our last visit reviewed. Allergies and medications reviewed and updated.    ROS: Per HPI unless specifically indicated above  History  Smoking status  . Former Smoker -- 1.00 packs/day for 30 years  . Quit date: 12/04/2001  Smokeless tobacco  . Never Used    Past Medical History Patient Active Problem List   Diagnosis Date Noted  . Leg pain 01/07/2016  . Chest pressure 01/07/2016  . Chronic kidney disease (CKD), stage IV (severe) ([place]) 11/22/2015  . Constipation 10/25/2015  . Pleural nodules 03/08/2015  . Bronchitis, chronic obstructive w acute bronchitis ([place]) 03/06/2015  . Chronic kidney disease, stage IV (severe) ([place]) 03/06/2015  . Morbid obesity ([place]) 03/06/2015  . Pericardial effusion 03/06/2015  . Acute on chronic diastolic CHF (congestive heart failure) ([place]) 03/05/2015  . PAD (peripheral artery disease) ([place]) 04/21/2014  . Leukocytosis 02/02/2014  . Hypokalemia 09/30/2013  . Dyspnea 09/29/2013  . Abnormal chest x-ray 09/29/2013  . Acute diastolic CHF (congestive heart failure) ([place]) 09/29/2013  . Type  2 diabetes mellitus with hypoglycemia ([place]) 01/02/2012  . CKD (chronic kidney disease) 01/02/2012  . Diverticulosis 03/29/2011  . Diabetic neuropathy ([place]) 03/29/2011  . Esophageal stricture 03/29/2011  . Vitamin D deficiency 03/29/2011  . Degenerative joint disease of left shoulder 03/29/2011  . Neck pain 03/29/2011  . Type II diabetes mellitus with nephropathy ([place]) 04/19/2009  . Hyperlipidemia 04/19/2009  . HYPERTENSION, BENIGN 04/19/2009  . Coronary atherosclerosis 04/19/2009        Objective:    BP 148/59 mmHg  Pulse 61  Temp(Src) 97.1 F (36.2 C) (Oral)  Ht 5\' 3"  (1.6 m)  Wt 197 lb (89.359 kg)  BMI 34.91 kg/m2  Wt Readings from Last 3 Encounters:  01/07/16 197 lb (89.359 kg)  12/29/15 198 lb (89.812 kg)  12/09/15 196 lb 12.8 oz (89.268 kg)     Gen: NAD, alert, cooperative with exam, NCAT EYES: EOMI, no scleral injection or icterus ENT:   OP without erythema LYMPH: no cervical LAD CV: NRRR, normal S1/S2, distal pulses 2+ b/l Resp: CTABL, no wheezes, normal WOB Abd: +BS, soft, NTND. Ext: No edema, warm Neuro: Alert and oriented, strength equal b/l UE and LE, coordination grossly normal MSK: normal muscle bulk     Assessment & Plan:    Saranya was seen today for follow-up multiple med problems.  Diagnoses and all orders for this visit:  HYPERTENSION, BENIGN Adequate control today, continue current medicines. Elevated with chest pressure on dialysis days when she doesn't take  her meds in the morning, with Bps up to 190s-200s per pt in dialysis. She is going to bring meds to dialysis, likely needs to take at least one of them prior to dialysis. Weight has been stable so minimal fluid getting pulled off now per pt. Has a stress test scheduled for later this month.  Pain of lower extremity, unspecified laterality Worse at night, she attributes to rosuvastatin. No pain n days she doesn't take it. Will decrease dose. If still with leg cramps consider switch to  pravastatin.  Hyperlipidemia -     rosuvastatin (CRESTOR) 10 MG tablet; Take 1 tablet (10 mg total) by mouth at bedtime.  Chest pressure See above.  Type II diabetes mellitus with nephropathy (HCC) Well controlled, continue current meds. Refills sent in. -     insulin lispro (HUMALOG) 100 UNIT/ML injection; Inject 0.1-0.13 mLs (10-13 Units total) into the skin 3 (three) times daily with meals. Take 5 units prior to meals if BGL >150 -     Insulin Glargine (LANTUS SOLOSTAR) 100 UNIT/ML Solostar Pen; Inject 25 Units into the skin daily at 10 pm.    Follow up plan: Return in about 8 weeks (around 03/03/2016).  [name], MD [facility] 01/07/2016, 10:27 AM

## 2016-01-12 ENCOUNTER — Ambulatory Visit (HOSPITAL_COMMUNITY)
Admission: RE | Admit: 2016-01-12 | Discharge: 2016-01-12 | Disposition: A | Discharge status: Home / Self Care | Payer: Medicare Other | Source: Ambulatory Visit | Attending: Vascular Surgery | Admitting: Vascular Surgery

## 2016-01-12 ENCOUNTER — Ambulatory Visit (INDEPENDENT_AMBULATORY_CARE_PROVIDER_SITE_OTHER): Payer: Medicare Other | Admitting: Vascular Surgery

## 2016-01-12 ENCOUNTER — Encounter: Payer: Self-pay | Admitting: Vascular Surgery

## 2016-01-12 VITALS — BP 151/70 | HR 62 | Temp 97.6°F | Ht 63.0 in | Wt 196.6 lb

## 2016-01-12 DIAGNOSIS — E1122 Type 2 diabetes mellitus with diabetic chronic kidney disease: Secondary | ICD-10-CM | POA: Diagnosis not present

## 2016-01-12 DIAGNOSIS — E785 Hyperlipidemia, unspecified: Secondary | ICD-10-CM | POA: Insufficient documentation

## 2016-01-12 DIAGNOSIS — Z4931 Encounter for adequacy testing for hemodialysis: Secondary | ICD-10-CM | POA: Insufficient documentation

## 2016-01-12 DIAGNOSIS — E1140 Type 2 diabetes mellitus with diabetic neuropathy, unspecified: Secondary | ICD-10-CM | POA: Insufficient documentation

## 2016-01-12 DIAGNOSIS — N186 End stage renal disease: Secondary | ICD-10-CM | POA: Insufficient documentation

## 2016-01-12 DIAGNOSIS — I70213 Atherosclerosis of native arteries of extremities with intermittent claudication, bilateral legs: Secondary | ICD-10-CM

## 2016-01-12 DIAGNOSIS — I120 Hypertensive chronic kidney disease with stage 5 chronic kidney disease or end stage renal disease: Secondary | ICD-10-CM | POA: Insufficient documentation

## 2016-01-12 DIAGNOSIS — N185 Chronic kidney disease, stage 5: Secondary | ICD-10-CM

## 2016-01-12 NOTE — Progress Notes (Signed)
Patient name: [name] MRN: [id number] DOB: 1951-02-21 Sex: female  REASON FOR VISIT: follow up after right brachiocephalic AV fistula.  HPI: [name] is a 65 y.o. female who had placement of a tunneled dialysis catheter, ligation of a right radial cephalic fistula, and placement of a right brachiocephalic fistula on [id number]. She comes in for a 6 week follow up visit. She denies any pain or paresthesias in her right arm. She dialyzes via her right IJ tunneled dialysis catheter.  Today she discussed problems with pain in both legs. She has pain in the lower part of her legs that occurs with ambulation but also occurs at rest. She also describes pain in her feet at night which sounds like pain from neuropathy. It does not sound like classic rest pain. She denies any history of nonhealing wounds. He had been told in the past that because of her renal failure she should not have a dye study as this would worsen her renal function. Now that she is on dialysis, she is wondering if she should pursue further workup including arteriography.  Current Outpatient Prescriptions  Medication Sig Dispense Refill  . albuterol (PROVENTIL HFA;VENTOLIN HFA) 108 (90 BASE) MCG/ACT inhaler Inhale 2 puffs into the lungs every 6 (six) hours as needed for wheezing or shortness of breath. 1 Inhaler 2  . albuterol (PROVENTIL) (2.5 MG/3ML) 0.083% nebulizer solution Take 3 mLs (2.5 mg total) by nebulization every 6 (six) hours as needed for wheezing or shortness of breath. 75 mL 12  . aspirin 81 MG EC tablet Take 81 mg by mouth daily.      [name] atenolol (TENORMIN) 50 MG tablet Take 1 tablet (50 mg total) by mouth daily. 60 tablet 5  . budesonide-formoterol (SYMBICORT) 160-4.5 MCG/ACT inhaler Inhale 2 puffs into the lungs 2 (two) times daily. 1 Inhaler 12  . Carboxymethylcellulose Sodium (THERATEARS OP) Place 2 drops into both eyes as needed (for dry eyes). Reported on 12/09/2015    . cloNIDine (CATAPRES) 0.1 MG  tablet Take 0.1 mg by mouth 2 (two) times daily.    [name] docusate sodium (COLACE) 100 MG capsule Take 1 capsule (100 mg total) by mouth 2 (two) times daily. 60 capsule 4  . fluticasone (FLONASE) 50 MCG/ACT nasal spray Place 2 sprays into both nostrils daily.  98  . gabapentin (NEURONTIN) 300 MG capsule Take 1 capsule (300 mg total) by mouth at bedtime. DOSE DECREASED TO ONCE DAILY-AT BEDTIME BECAUSE OF YOUR KIDNEY FUNCTION (Patient taking differently: Take 300 mg by mouth at bedtime. )    . glucose blood test strip Test 4 times daily 120 each 5  . hydrALAZINE (APRESOLINE) 50 MG tablet Take 1 tablet (50 mg total) by mouth 2 (two) times daily. 1 tablet 0  . Insulin Glargine (LANTUS SOLOSTAR) 100 UNIT/ML Solostar Pen Inject 25 Units into the skin daily at 10 pm. 15 mL 11  . insulin lispro (HUMALOG) 100 UNIT/ML injection Inject 0.1-0.13 mLs (10-13 Units total) into the skin 3 (three) times daily with meals. Take 5 units prior to meals if BGL >150 10 mL 5  . isosorbide mononitrate (IMDUR) 60 MG 24 hr tablet Take 1 tablet (60 mg total) by mouth daily. 30 tablet 9  . lactulose (CHRONULAC) 10 GM/15ML solution Take 15 mLs (10 g total) by mouth 2 (two) times daily as needed for mild constipation. 240 mL 4  . LORazepam (ATIVAN) 0.5 MG tablet Take 1 tablet (0.5 mg total) by mouth every 8 (eight)  hours. 30 tablet 4  . NIFEdipine (PROCARDIA XL/ADALAT-CC) 90 MG 24 hr tablet Take 90 mg by mouth every evening.     . nitroGLYCERIN (NITROSTAT) 0.4 MG SL tablet Place 1 tablet (0.4 mg total) under the tongue every 5 (five) minutes as needed for chest pain. 25 tablet 5  . pantoprazole (PROTONIX) 40 MG tablet Take 1 tablet (40 mg total) by mouth 2 (two) times daily. For gastric acid reflux. 60 tablet 3  . polyethylene glycol (MIRALAX / GLYCOLAX) packet Take 17 g by mouth daily as needed for mild constipation or moderate constipation.     . rosuvastatin (CRESTOR) 10 MG tablet Take 1 tablet (10 mg total) by mouth at bedtime. 30  tablet 5  . torsemide (DEMADEX) 20 MG tablet Take 2 tablets (40 mg total) by mouth daily. New medicine to reduce fluid build up. (Patient taking differently: Take 40-60 mg by mouth daily. **Take 20 mg extra tablet (60mg  total) if weight is over 3-4 pounds.) 60 tablet 3   No current facility-administered medications for this visit.    REVIEW OF SYSTEMS:  [X]  denotes positive finding, [ ]  denotes negative finding Cardiac  Comments:  Chest pain or chest pressure:    Shortness of breath upon exertion:    Short of breath when lying flat:    Irregular heart rhythm:    Constitutional    Fever or chills:      PHYSICAL EXAM: Filed Vitals:   01/12/16 1341  BP: 151/70  Pulse: 62  Temp: 97.6 F (36.4 C)  TempSrc: Oral  Height: 5\' 3"  (1.6 m)  Weight: 196 lb 9.6 oz (89.177 kg)  SpO2: 96%    GENERAL: The patient is a well-nourished female, in no acute distress. The vital signs are documented above. CARDIOVASCULAR: There is a regular rate and rhythm. PULMONARY: There is good air exchange bilaterally without wheezing or rales. Her right upper arm fistula has an excellent bruit and thrill. She has a palpable right radial pulse. She has diminished femoral pulses. She has a fairly brisk posterior tibial signal bilaterally with monophasic dorsalis pedis and peroneal signals bilaterally.  DIALYSIS FISTULA DUPLEX: I have independently interpreted the duplex of the dialysis fistula which shows that the diameters of the fistula range from 0.63-0.91 cm. That appear reasonable. There are no areas of significantly increased velocity except in the proximal fistula where the vein is somewhat tortuous. There is also 1 competing branches identified by duplex.  MEDICAL ISSUES:  STAGE V CHRONIC KIDNEY DISEASE: Her fistula appears to be maturing adequately. I think it should be ready for use in late March. I have encouraged her to continue to exercise her arm.  PERIPHERAL VASCULAR DISEASE: She does have  evidence of multilevel arterial occlusive disease bilaterally. However she does not have rest pain or any nonhealing ulcers and therefore I would not recommend an aggressive workup at this time. However, she is very concerned about her circulation and for this reason we will continue to follow this closely. I ordered follow up ABIs in 6 months and I'll see her back at that time. She knows to call sooner if she has problems. Fortunately, she is not a smoker. I have encouraged her to stay as active as possible.  HYPERTENSION: [name] initial blood pressure today was elevated. We repeated this and this was still elevated. We have encouraged the patient to follow up with their primary care physician for management of their blood pressure.   Deitra [facility] [employer]: [phone]

## 2016-01-12 NOTE — Addendum Note (Signed)
Addended by: Dorthula Rue L on: 01/12/2016 05:31 PM   Modules accepted: Orders

## 2016-01-14 ENCOUNTER — Other Ambulatory Visit: Payer: Self-pay | Admitting: Pediatrics

## 2016-01-14 MED ORDER — INSULIN LISPRO 100 UNIT/ML (KWIKPEN)
PEN_INJECTOR | SUBCUTANEOUS | Status: DC
Start: 2016-01-14 — End: 2016-09-13

## 2016-01-20 ENCOUNTER — Other Ambulatory Visit: Payer: Self-pay | Admitting: Family Medicine

## 2016-01-26 ENCOUNTER — Ambulatory Visit (HOSPITAL_COMMUNITY): Payer: Medicare Other | Attending: Cardiology

## 2016-01-26 ENCOUNTER — Ambulatory Visit (HOSPITAL_BASED_OUTPATIENT_CLINIC_OR_DEPARTMENT_OTHER): Payer: Medicare Other

## 2016-01-26 DIAGNOSIS — E785 Hyperlipidemia, unspecified: Secondary | ICD-10-CM | POA: Insufficient documentation

## 2016-01-26 DIAGNOSIS — N189 Chronic kidney disease, unspecified: Secondary | ICD-10-CM | POA: Insufficient documentation

## 2016-01-26 DIAGNOSIS — I129 Hypertensive chronic kidney disease with stage 1 through stage 4 chronic kidney disease, or unspecified chronic kidney disease: Secondary | ICD-10-CM | POA: Insufficient documentation

## 2016-01-26 DIAGNOSIS — R0989 Other specified symptoms and signs involving the circulatory and respiratory systems: Secondary | ICD-10-CM

## 2016-01-26 DIAGNOSIS — I739 Peripheral vascular disease, unspecified: Secondary | ICD-10-CM | POA: Diagnosis not present

## 2016-01-26 DIAGNOSIS — Z992 Dependence on renal dialysis: Secondary | ICD-10-CM | POA: Diagnosis not present

## 2016-01-26 DIAGNOSIS — R0600 Dyspnea, unspecified: Secondary | ICD-10-CM | POA: Diagnosis not present

## 2016-01-27 MED ORDER — DOBUTAMINE INFUSION FOR EP/ECHO/NUC (1000 MCG/ML)
40.0000 ug/kg/min | Freq: Once | INTRAVENOUS | Status: AC
  Administered 2016-01-26: 40 ug/kg/min via INTRAVENOUS

## 2016-02-01 ENCOUNTER — Ambulatory Visit (INDEPENDENT_AMBULATORY_CARE_PROVIDER_SITE_OTHER): Payer: Medicare Other | Admitting: Family

## 2016-02-01 ENCOUNTER — Encounter: Payer: Self-pay | Admitting: Family

## 2016-02-01 VITALS — BP 153/68 | HR 62 | Temp 97.6°F | Ht 63.0 in | Wt 195.4 lb

## 2016-02-01 DIAGNOSIS — R0682 Tachypnea, not elsewhere classified: Secondary | ICD-10-CM

## 2016-02-01 DIAGNOSIS — R0602 Shortness of breath: Secondary | ICD-10-CM

## 2016-02-01 DIAGNOSIS — R0600 Dyspnea, unspecified: Secondary | ICD-10-CM

## 2016-02-01 DIAGNOSIS — F411 Generalized anxiety disorder: Secondary | ICD-10-CM

## 2016-02-01 MED ORDER — ESCITALOPRAM OXALATE 10 MG PO TABS: 10.0000 mg | ORAL_TABLET | Freq: Every day | ORAL | Status: DC

## 2016-02-01 NOTE — Progress Notes (Signed)
   Subjective:    Patient ID: [name], female    DOB: Apr 06, 1951, 65 y.o.   MRN: [id number]  Pt is currently on dialysis for chronic kidney failure. PT states she has become SOB every time she gets dialysis and while she is getting dialysis her "chest becomes tight". Pt states she feels SOB sitting  Here. Pt's 02 is 100% today. Pt states she thought it was her "heart, but the heart doctor told me my heart was good".  Pt states she has seen a "lung doctor, but he only gives me prednisone".  Pt states when she takes her ativan helps with the SOB. PT states the albuterol does not "do much" to help with the SOB. Shortness of Breath This is a new problem. The current episode started more than 1 month ago (Since December). The problem has been waxing and waning. Pertinent negatives include no headaches.      Review of Systems  Constitutional: Negative.   HENT: Negative.   Eyes: Negative.   Respiratory: Positive for shortness of breath.   Cardiovascular: Negative.  Negative for palpitations.  Gastrointestinal: Negative.   Endocrine: Negative.   Genitourinary: Negative.   Musculoskeletal: Negative.   Neurological: Negative.  Negative for headaches.  Hematological: Negative.   Psychiatric/Behavioral: Negative.   All other systems reviewed and are negative.      Objective:   Physical Exam  Constitutional: She is oriented to person, place, and time. She appears well-developed and well-nourished. No distress.  Neck: Normal range of motion. Neck supple. No thyromegaly present.  Cardiovascular: Normal rate, regular rhythm, normal heart sounds and intact distal pulses.   No murmur heard. Pulmonary/Chest: Effort normal. No respiratory distress. She has no wheezes.  Tachypnea    Abdominal: Soft. Bowel sounds are normal. She exhibits no distension. There is no tenderness.  Musculoskeletal: Normal range of motion. She exhibits no edema or tenderness.  Neurological: She is alert and oriented  to person, place, and time.  Skin: Skin is warm and dry.  Psychiatric: She has a normal mood and affect. Her behavior is normal. Judgment and thought content normal.  Vitals reviewed.     BP 153/68 mmHg  Pulse 62  Temp(Src) 97.6 F (36.4 C) (Oral)  Ht 5\' 3"  (1.6 m)  Wt 195 lb 6.4 oz (88.633 kg)  BMI 34.62 kg/m2  SpO2 100%     Assessment & Plan:  1. Dyspnea -Pt to continue with Symbicort and albuterol as needed Pt to keep appt with Pulmonologist, and Cardiologist !!!! Keep all appts with dialysis   2. SOB (shortness of breath)  3. Tachypnea - escitalopram (LEXAPRO) 10 MG tablet; Take 1 tablet (10 mg total) by mouth daily.  Dispense: 90 tablet; Refill: 3  4. GAD (generalized anxiety disorder) -PT started on lexapro 10 mg today RTO in 4 weeks to recheck GAD - escitalopram (LEXAPRO) 10 MG tablet; Take 1 tablet (10 mg total) by mouth daily.  Dispense: 90 tablet; Refill: [place], FNP

## 2016-02-01 NOTE — Patient Instructions (Addendum)
Shortness of Breath Shortness of breath means you have trouble breathing. It could also mean that you have a medical problem. You should get immediate medical care for shortness of breath. CAUSES   Not enough oxygen in the air such as with high altitudes or a smoke-filled room.  Certain lung diseases, infections, or problems.  Heart disease or conditions, such as angina or heart failure.  Low red blood cells (anemia).  Poor physical fitness, which can cause shortness of breath when you exercise.  Chest or back injuries or stiffness.  Being overweight.  Smoking.  Anxiety, which can make you feel like you are not getting enough air. DIAGNOSIS  Serious medical problems can often be found during your physical exam. Tests may also be done to determine why you are having shortness of breath. Tests may include:  Chest X-rays.  Lung function tests.  Blood tests.  An electrocardiogram (ECG).  An ambulatory electrocardiogram. An ambulatory ECG records your heartbeat patterns over a 24-hour period.  Exercise testing.  A transthoracic echocardiogram (TTE). During echocardiography, sound waves are used to evaluate how blood flows through your heart.  A transesophageal echocardiogram (TEE).  Imaging scans. Your health care provider may not be able to find a cause for your shortness of breath after your exam. In this case, it is important to have a follow-up exam with your health care provider as directed.  TREATMENT  Treatment for shortness of breath depends on the cause of your symptoms and can vary greatly. HOME CARE INSTRUCTIONS   Do not smoke. Smoking is a common cause of shortness of breath. If you smoke, ask for help to quit.  Avoid being around chemicals or things that may bother your breathing, such as paint fumes and dust.  Rest as needed. Slowly resume your usual activities.  If medicines were prescribed, take them as directed for the full length of time directed. This  includes oxygen and any inhaled medicines.  Keep all follow-up appointments as directed by your health care provider. SEEK MEDICAL CARE IF:   Your condition does not improve in the time expected.  You have a hard time doing your normal activities even with rest.  You have any new symptoms. SEEK IMMEDIATE MEDICAL CARE IF:   Your shortness of breath gets worse.  You feel light-headed, faint, or develop a cough not controlled with medicines.  You start coughing up blood.  You have pain with breathing.  You have chest pain or pain in your arms, shoulders, or abdomen.  You have a fever.  You are unable to walk up stairs or exercise the way you normally do. MAKE SURE YOU:  Understand these instructions.  Will watch your condition.  Will get help right away if you are not doing well or get worse.   This information is not intended to replace advice given to you by your health care provider. Make sure you discuss any questions you have with your health care provider.   Document Released: 08/15/2001 Document Revised: 11/25/2013 Document Reviewed: 02/05/2012 Elsevier Interactive Patient Education 2016 Elsevier Inc. Generalized Anxiety Disorder Generalized anxiety disorder (GAD) is a mental disorder. It interferes with life functions, including relationships, work, and school. GAD is different from normal anxiety, which everyone experiences at some point in their lives in response to specific life events and activities. Normal anxiety actually helps [place] prepare for and get through these life events and activities. Normal anxiety goes away after the event or activity is over.  GAD causes  anxiety that is not necessarily related to specific events or activities. It also causes excess anxiety in proportion to specific events or activities. The anxiety associated with GAD is also difficult to control. GAD can vary from mild to severe. People with severe GAD can have intense waves of anxiety  with physical symptoms (panic attacks).  SYMPTOMS The anxiety and worry associated with GAD are difficult to control. This anxiety and worry are related to many life events and activities and also occur more days than not for 6 months or longer. People with GAD also have three or more of the following symptoms (one or more in children):  Restlessness.   Fatigue.  Difficulty concentrating.   Irritability.  Muscle tension.  Difficulty sleeping or unsatisfying sleep. DIAGNOSIS GAD is diagnosed through an assessment by your health care provider. Your health care provider will ask you questions aboutyour mood,physical symptoms, and events in your life. Your health care provider may ask you about your medical history and use of alcohol or drugs, including prescription medicines. Your health care provider may also do a physical exam and blood tests. Certain medical conditions and the use of certain substances can cause symptoms similar to those associated with GAD. Your health care provider may refer you to a mental health specialist for further evaluation. TREATMENT The following therapies are usually used to treat GAD:   Medication. Antidepressant medication usually is prescribed for long-term daily control. Antianxiety medicines may be added in severe cases, especially when panic attacks occur.   Talk therapy (psychotherapy). Certain types of talk therapy can be helpful in treating GAD by providing support, education, and guidance. A form of talk therapy called cognitive behavioral therapy can teach you healthy ways to think about and react to daily life events and activities.  Stress managementtechniques. These include yoga, meditation, and exercise and can be very helpful when they are practiced regularly. A mental health specialist can help determine which treatment is best for you. Some people see improvement with one therapy. However, other people require a combination of therapies.    This information is not intended to replace advice given to you by your health care provider. Make sure you discuss any questions you have with your health care provider.   Document Released: 03/17/2013 Document Revised: 12/11/2014 Document Reviewed: 03/17/2013 Elsevier Interactive Patient Education [employer].

## 2016-02-12 ENCOUNTER — Other Ambulatory Visit: Payer: Self-pay | Admitting: Family Medicine

## 2016-02-23 ENCOUNTER — Ambulatory Visit (INDEPENDENT_AMBULATORY_CARE_PROVIDER_SITE_OTHER): Payer: Medicare Other | Admitting: Cardiology

## 2016-02-23 ENCOUNTER — Encounter: Payer: Self-pay | Admitting: Cardiology

## 2016-02-23 VITALS — BP 120/58 | HR 52 | Ht 63.0 in | Wt 192.0 lb

## 2016-02-23 DIAGNOSIS — R5383 Other fatigue: Secondary | ICD-10-CM

## 2016-02-23 NOTE — Progress Notes (Signed)
HPI The patient presents for follow up of CAD and diastolic heart failure with diastolic dysfunction.   Since I last saw her she has had no new cardiovascular complaints. She had a dobutamine echo which was negative for ischemia although her heart rate did not elevate as much as I would like and so was a submaximal test. She continues to have some atypical chest pain that happens with [place] occasionally only. On those days she'll get some muscle aches and minus S soreness. She actually uses oxygen with dialysis though her saturations don't drop area she's not taking nitroglycerin. However, she says that she does have palpitations or episodes of shortness of breath or other symptoms her symptoms go away with anxiolytics. She can do her daily chores and she gets very fatigued but she's not bringing on chest discomfort with this kind of physical activity. She has some continued mild lower externally swelling but she's not describing new shortness of breath chronically, PND or orthopnea. Her weight is down from her peak.  Allergies  Allergen Reactions  . Ace Inhibitors Cough  . Clopidogrel Bisulfate Other (See Comments)    REACTION: Sick, Headache, "Felt terrible"  . Codeine Other (See Comments)    REACTION: hallucinations  . Lisinopril Other (See Comments)    cough  . Penicillins Other (See Comments)    Has patient had a PCN reaction causing immediate rash, facial/tongue/throat swelling, SOB or lightheadedness with hypotension: unknown Has patient had a PCN reaction causing severe rash involving mucus membranes or skin necrosis: unknown Has patient had a PCN reaction that required hospitalization unknown Has patient had a PCN reaction occurring within the last 10 years:unknown If all of the above answers are "NO", then may proceed with Cephalosporin use.     Current Outpatient Prescriptions  Medication Sig Dispense Refill  . albuterol (PROVENTIL HFA;VENTOLIN HFA) 108 (90 BASE) MCG/ACT  inhaler Inhale 2 puffs into the lungs every 6 (six) hours as needed for wheezing or shortness of breath. 1 Inhaler 2  . aspirin 81 MG EC tablet Take 81 mg by mouth daily.      [name] atenolol (TENORMIN) 50 MG tablet Take 50 mg by mouth 2 (two) times daily.   2  . budesonide-formoterol (SYMBICORT) 160-4.5 MCG/ACT inhaler Inhale 2 puffs into the lungs 2 (two) times daily. 1 Inhaler 12  . Carboxymethylcellulose Sodium (THERATEARS OP) Place 2 drops into both eyes as needed (for dry eyes). Reported on 12/09/2015    . cloNIDine (CATAPRES) 0.1 MG tablet Take 0.1 mg by mouth 2 (two) times daily.    [name] docusate sodium (COLACE) 100 MG capsule Take 1 capsule (100 mg total) by mouth 2 (two) times daily. 60 capsule 4  . escitalopram (LEXAPRO) 10 MG tablet Take 1 tablet (10 mg total) by mouth daily. 90 tablet 3  . fluticasone (FLONASE) 50 MCG/ACT nasal spray Place 2 sprays into both nostrils daily.  98  . gabapentin (NEURONTIN) 300 MG capsule Take 1 capsule (300 mg total) by mouth at bedtime. DOSE DECREASED TO ONCE DAILY-AT BEDTIME BECAUSE OF YOUR KIDNEY FUNCTION (Patient taking differently: Take 300 mg by mouth at bedtime. )    . hydrALAZINE (APRESOLINE) 50 MG tablet Take 1 tablet (50 mg total) by mouth 2 (two) times daily. 1 tablet 0  . Insulin Glargine (LANTUS SOLOSTAR) 100 UNIT/ML Solostar Pen Inject 25 Units into the skin daily at 10 pm. 15 mL 11  . insulin lispro (HUMALOG KWIKPEN) 100 UNIT/ML KiwkPen Take 5u before  meals for BGL >150 15 mL 11  . isosorbide mononitrate (IMDUR) 60 MG 24 hr tablet Take 1 tablet (60 mg total) by mouth daily. 30 tablet 9  . lactulose (CHRONULAC) 10 GM/15ML solution Take 15 mLs (10 g total) by mouth 2 (two) times daily as needed for mild constipation. 240 mL 4  . LORazepam (ATIVAN) 0.5 MG tablet Take 1 tablet (0.5 mg total) by mouth every 8 (eight) hours. 30 tablet 4  . NIFEdipine (PROCARDIA XL/ADALAT-CC) 90 MG 24 hr tablet Take 90 mg by mouth every evening.     . nitroGLYCERIN  (NITROSTAT) 0.4 MG SL tablet Place 1 tablet (0.4 mg total) under the tongue every 5 (five) minutes as needed for chest pain. 25 tablet 5  . pantoprazole (PROTONIX) 40 MG tablet TAKE 1 TABLET TWICE DAILY FOR GASTRIC ACID REFLUX. 60 tablet 4  . polyethylene glycol (MIRALAX / GLYCOLAX) packet Take 17 g by mouth daily as needed for mild constipation or moderate constipation.     . rosuvastatin (CRESTOR) 10 MG tablet Take 1 tablet (10 mg total) by mouth at bedtime. 30 tablet 5  . torsemide (DEMADEX) 20 MG tablet Take 2 tablets (40 mg total) by mouth daily. New medicine to reduce fluid build up. (Patient taking differently: Take 40-60 mg by mouth daily. **Take 20 mg extra tablet (60mg  total) if weight is over 3-4 pounds.) 60 tablet 3  . albuterol (PROVENTIL) (2.5 MG/3ML) 0.083% nebulizer solution Take 3 mLs (2.5 mg total) by nebulization every 6 (six) hours as needed for wheezing or shortness of breath. 75 mL 12  . glucose blood test strip Test 4 times daily 120 each 5   No current facility-administered medications for this visit.    Past Medical History  Diagnosis Date  . Esophageal stricture   . Hyperlipidemia   . Hypertension   . Bronchitis   . CAD (coronary artery disease)     s/p Stenting in 2005;  [place] 7/09: Vigorous LV function, 2-3+ MR, RI 40%, RI stent patent, proximal-mid RCA 40-50%;  Echo 7/09:  EF 55-65%, trivial MR;  [facility] 11/18/12: EF 66%, normal LV wall motion, mild to moderate anterior ischemia - reviewed by [facility] and felt to rep breast atten (low risk);  Echo 6/14: mild LVH, EF 55-65%, Gr 2 DD, PASP 44, trivial eff   . Arthritis   . IDDM (insulin dependent diabetes mellitus) ([place])   . Diverticulosis   . Diabetic neuropathy ([place])   . Vitamin D deficiency   . Degenerative joint disease     left shoulder  . Chronic neck pain   . Anemia     hx of  . Stroke [facility])     "mini- years ago"  . Chronic kidney disease     CREATININE IS UP--GOING TO KIDNEY MD   . CHF (congestive heart  failure) ([place])   . Pericardial effusion 03/06/2015    Very small per echo ; pleural nodules seen on CT chest.  . Acute on chronic diastolic CHF (congestive heart failure) ([place]) 03/05/2015    Grade 2. EF 60-65%  . COPD (chronic obstructive pulmonary disease) ([place])   . Peripheral vascular disease ([place])   . Pneumonia   . Anxiety   . GERD (gastroesophageal reflux disease)   . Constipation     Past Surgical History  Procedure Laterality Date  . Bladder surgery      Tack  . Partial hysterectomy    . Shoulder surgery Right     Rotator cuff  .  Cervical spine surgery  2012    8 screws  . Abdominal hysterectomy    . Laminectomy  12/29/2011  . Cardiac stents    . Lumbar laminectomy/decompression microdiscectomy  12/29/2011    Procedure: LUMBAR LAMINECTOMY/DECOMPRESSION MICRODISCECTOMY;  Surgeon: Floyce Stakes, MD;  Location: [place];  Service: Neurosurgery;  Laterality: N/A;  Lumbar Two through Lumbar Five Laminectomies/Cellsaver  . Back surgery      2007 & 2013  . Cystoscopy with injection N/A 06/03/2013    Procedure: MACROPLASTIQUE  INJECTION ;  Surgeon: Malka So, MD;  Location: [facility];  Service: Urology;  Laterality: N/A;  . Cardiac catheterization  2003, 2005, 2009    1 stent  . Cataract extraction w/phaco Right 05/04/2014    Procedure: CATARACT EXTRACTION PHACO AND INTRAOCULAR LENS PLACEMENT (IOC);  Surgeon: Williams Che, MD;  Location: [facility];  Service: Ophthalmology;  Laterality: Right;  CDE 1.47  . Cataract extraction w/phaco Left 07/20/2014    Procedure: CATARACT EXTRACTION WITH PHACO AND INTRAOCULAR LENS PLACEMENT LEFT EYE CDE=1.79;  Surgeon: Williams Che, MD;  Location: [facility];  Service: Ophthalmology;  Laterality: Left;  . Av fistula placement Right 07/20/2015    Procedure: RADIAL CEPHALIC ARTERIOVENOUS FISTULA CREATION RIGHT ARM;  Surgeon: Angelia Mould, MD;  Location: [facility];  Service: Vascular;  Laterality: Right;  . Peripheral vascular catheterization N/A  11/22/2015    Procedure: Fistulagram;  Surgeon: Angelia Mould, MD;  Location: [facility] CV LAB;  Service: Cardiovascular;  Laterality: N/A;  . Insertion of dialysis catheter N/A 11/26/2015    Procedure: INSERTION OF DIALYSIS CATHETER;  Surgeon: Angelia Mould, MD;  Location: [facility];  Service: Vascular;  Laterality: N/A;  . Av fistula placement Right 11/26/2015    Procedure:  Creation of RIGHT BRACHIO-CEPHALIC arteriovenous fistula;  Surgeon: Angelia Mould, MD;  Location: [facility];  Service: Vascular;  Laterality: Right;    ROS:  Positive for insomnia. Otherwise as stated in the HPI and negative for all other systems.  PHYSICAL EXAM BP 120/58 mmHg  Pulse 52  Ht 5\' 3"  (1.6 m)  Wt 192 lb (87.091 kg)  BMI 34.02 kg/m2 GENERAL:  Well appearing HEENT:  Pupils equal round and reactive, fundi not visualized, oral mucosa unremarkable NECK:  No jugular venous distention, waveform within normal limits, carotid upstroke brisk and symmetric, no bruits, no thyromegaly LYMPHATICS:  No cervical, inguinal adenopathy LUNGS:  Clear to auscultation bilaterally BACK:  No CVA tenderness CHEST:  Unremarkable HEART:  PMI not displaced or sustained,S1 and S2 within normal limits, no S3, no S4, no clicks, no rubs, no murmurs ABD:  Flat, positive bowel sounds normal in frequency in pitch, no bruits, no rebound, no guarding, no midline pulsatile mass, no hepatomegaly, no splenomegaly EXT:  2 plus pulses upper, decreased dorsalis pedis and posterior tibialis bilaterally, mild bilateral lower extremity edema, no cyanosis no clubbing, right upper arm thrill and bruit SKIN:  No rashes no nodules, keloids   ASSESSMENT AND PLAN   CAD -  She had a negative submaximal stress test. I don't strongly suspect obstructive coronary disease following this test. She does have some atypical symptoms sometimes on dialysis. July note this worsens. I will continue her Imdur.  HYPERTENSION, BENIGN -  Her blood  pressure is now lower and consistently so is seen. Very fatigued. I will stop the clonidine and she will keep a blood pressure diary.  DYSLIPIDEMIA -   I will follow-up in the future with  a lipid profile.  CHRONIC DIASTOLIC - Her volume is now being managed with dialysis.  ABNORMAL CT - [place] opacities recently identified resolved on the January CT. There was coronary calcium.  CKD - As above

## 2016-02-23 NOTE — Patient Instructions (Signed)
Medication Instructions:  Please stop your Clonidine. Continue all other medications as listed.  Follow-Up: Follow up in 6 months with Dr. Percival Spanish.  You will receive a letter in the mail 2 months before you are due.  Please call us when you receive this letter to schedule your follow up appointment.  If you need a refill on your cardiac medications before your next appointment, please call your pharmacy.  Thank you for choosing [facility]!!

## 2016-03-03 ENCOUNTER — Ambulatory Visit (INDEPENDENT_AMBULATORY_CARE_PROVIDER_SITE_OTHER): Payer: Medicare Other | Admitting: Pediatrics

## 2016-03-03 ENCOUNTER — Encounter: Payer: Self-pay | Admitting: Pediatrics

## 2016-03-03 VITALS — BP 139/56 | HR 60 | Temp 97.4°F | Ht 63.0 in | Wt 191.2 lb

## 2016-03-03 DIAGNOSIS — E1121 Type 2 diabetes mellitus with diabetic nephropathy: Secondary | ICD-10-CM

## 2016-03-03 DIAGNOSIS — I25119 Atherosclerotic heart disease of native coronary artery with unspecified angina pectoris: Secondary | ICD-10-CM

## 2016-03-03 DIAGNOSIS — I10 Essential (primary) hypertension: Secondary | ICD-10-CM | POA: Diagnosis not present

## 2016-03-03 DIAGNOSIS — K5901 Slow transit constipation: Secondary | ICD-10-CM

## 2016-03-03 DIAGNOSIS — I739 Peripheral vascular disease, unspecified: Secondary | ICD-10-CM

## 2016-03-03 DIAGNOSIS — J449 Chronic obstructive pulmonary disease, unspecified: Secondary | ICD-10-CM

## 2016-03-03 DIAGNOSIS — F411 Generalized anxiety disorder: Secondary | ICD-10-CM | POA: Diagnosis not present

## 2016-03-03 DIAGNOSIS — I5032 Chronic diastolic (congestive) heart failure: Secondary | ICD-10-CM

## 2016-03-03 DIAGNOSIS — E1142 Type 2 diabetes mellitus with diabetic polyneuropathy: Secondary | ICD-10-CM | POA: Diagnosis not present

## 2016-03-03 NOTE — Progress Notes (Signed)
    Subjective:    Patient ID: [name], female    DOB: 07/09/1951, 65 y.o.   MRN: [id number]  CC: Follow-up multiple med problems  HPI: [name] is a 65 y.o. female presenting for Follow-up  Uses oxygen at dialysis Has never qualified for O2 for home  COPD: Breathing has been much improved since starting dialysis Occasionally has to use breathing   DM2: lantus 25u at night Rarely needing humalog Last week fasting BGLs 80s-100s HgA1c was 6.6 on 02/29/2016 at dialysis  ESRD: on dialysis Still making urine Still on torsemide Bruising around fistula site  HLD: recently decreased crestor to 10mg , legs not hurting as badly  PAD: walking well  Recent labs from 3/28 dialysis: Alb: 3.9 Hg 10.9 Ca 8.9 Phos 4.6 K 4.1 HgA1c 6.6   Depression screen [facility] 2/9 03/03/2016 01/07/2016 12/09/2015 11/08/2015 10/08/2015  Decreased Interest 0 0 0 0 0  Down, Depressed, Hopeless 0 0 0 0 0  PHQ - 2 Score 0 0 0 0 0     Relevant past medical, surgical, family and social history reviewed and updated as indicated. Interim medical history since our last visit reviewed. Allergies and medications reviewed and updated.    ROS: Per HPI unless specifically indicated above  History  Smoking status  . Former Smoker -- 1.00 packs/day for 30 years  . Quit date: 12/04/2001  Smokeless tobacco  . Never Used        Objective:    BP 139/56 mmHg  Pulse 60  Temp(Src) 97.4 F (36.3 C) (Oral)  Ht 5\' 3"  (1.6 m)  Wt 191 lb 3.2 oz (86.728 kg)  BMI 33.88 kg/m2  Wt Readings from Last 3 Encounters:  03/03/16 191 lb 3.2 oz (86.728 kg)  02/23/16 192 lb (87.091 kg)  02/01/16 195 lb 6.4 oz (88.633 kg)    Gen: NAD, alert, cooperative with exam, NCAT EYES: EOMI, no scleral injection or icterus ENT:  TMs pearly gray b/l, OP without erythema LYMPH: no cervical LAD CV: NRRR, normal S1/S2, no murmur, distal pulses 2+ b/l Resp: CTABL, no wheezes, normal WOB Abd: +BS, soft, NTND. Ext: No  edema, warm Neuro: Alert and oriented, strength equal b/l UE and LE, coordination grossly normal MSK: normal muscle bulk Skin: purple bruising over fistula RUE     Assessment & Plan:    [name] was seen today for follow-up multiple med problems.  Diagnoses and all orders for this visit:  HYPERTENSION, BENIGN Well controlled, cont current meds  PAD (peripheral artery disease) ([place]) stable  Atherosclerosis of native coronary artery of native heart with angina pectoris (HCC) Stable, no symptoms  GAD (generalized anxiety disorder) Well controlled, much improved with start of lexapro. Trying to minimize ativan use.  Type II diabetes mellitus with nephropathy (HCC) Well controlled. May continue to need less insulin now with start of dialysis. Continue fasting BGLs  Diabetic polyneuropathy associated with type 2 diabetes mellitus ([place]) Stop gabapentin with worsening renal function  Slow transit constipation Improved.   Chronic obstructive pulmonary disease, unspecified COPD type ([place]) Stable. Normal exam. Using albuterol occasionally. Breathing better since starting dialysis.  Chronic diastolic CHF Stable, euvolemic on exam, continue meds  Follow up plan: Return in about 3 months (around 06/09/2016).  [name], MD [facility] 03/03/2016, 10:47 AM

## 2016-03-04 DIAGNOSIS — F411 Generalized anxiety disorder: Secondary | ICD-10-CM | POA: Insufficient documentation

## 2016-03-16 ENCOUNTER — Other Ambulatory Visit: Payer: Self-pay

## 2016-03-21 ENCOUNTER — Other Ambulatory Visit (HOSPITAL_COMMUNITY): Payer: Self-pay | Admitting: *Deleted

## 2016-03-22 ENCOUNTER — Ambulatory Visit (HOSPITAL_COMMUNITY)
Admission: RE | Admit: 2016-03-22 | Discharge: 2016-03-22 | Disposition: A | Discharge status: Home / Self Care | Payer: Medicare Other | Source: Ambulatory Visit | Attending: Vascular Surgery | Admitting: Vascular Surgery

## 2016-03-22 DIAGNOSIS — J449 Chronic obstructive pulmonary disease, unspecified: Secondary | ICD-10-CM | POA: Insufficient documentation

## 2016-03-22 DIAGNOSIS — N186 End stage renal disease: Secondary | ICD-10-CM | POA: Diagnosis not present

## 2016-03-22 DIAGNOSIS — Z87891 Personal history of nicotine dependence: Secondary | ICD-10-CM | POA: Diagnosis not present

## 2016-03-22 DIAGNOSIS — E559 Vitamin D deficiency, unspecified: Secondary | ICD-10-CM | POA: Insufficient documentation

## 2016-03-22 DIAGNOSIS — E1140 Type 2 diabetes mellitus with diabetic neuropathy, unspecified: Secondary | ICD-10-CM | POA: Diagnosis not present

## 2016-03-22 DIAGNOSIS — E1122 Type 2 diabetes mellitus with diabetic chronic kidney disease: Secondary | ICD-10-CM | POA: Diagnosis not present

## 2016-03-22 DIAGNOSIS — Z885 Allergy status to narcotic agent status: Secondary | ICD-10-CM | POA: Insufficient documentation

## 2016-03-22 DIAGNOSIS — Z79899 Other long term (current) drug therapy: Secondary | ICD-10-CM | POA: Diagnosis not present

## 2016-03-22 DIAGNOSIS — Z4901 Encounter for fitting and adjustment of extracorporeal dialysis catheter: Secondary | ICD-10-CM | POA: Diagnosis not present

## 2016-03-22 DIAGNOSIS — Z880 Allergy status to penicillin: Secondary | ICD-10-CM | POA: Diagnosis not present

## 2016-03-22 DIAGNOSIS — I739 Peripheral vascular disease, unspecified: Secondary | ICD-10-CM | POA: Insufficient documentation

## 2016-03-22 DIAGNOSIS — Z7982 Long term (current) use of aspirin: Secondary | ICD-10-CM | POA: Insufficient documentation

## 2016-03-22 DIAGNOSIS — Z8673 Personal history of transient ischemic attack (TIA), and cerebral infarction without residual deficits: Secondary | ICD-10-CM | POA: Diagnosis not present

## 2016-03-22 DIAGNOSIS — I2510 Atherosclerotic heart disease of native coronary artery without angina pectoris: Secondary | ICD-10-CM | POA: Diagnosis not present

## 2016-03-22 DIAGNOSIS — K219 Gastro-esophageal reflux disease without esophagitis: Secondary | ICD-10-CM | POA: Insufficient documentation

## 2016-03-22 DIAGNOSIS — I120 Hypertensive chronic kidney disease with stage 5 chronic kidney disease or end stage renal disease: Secondary | ICD-10-CM | POA: Insufficient documentation

## 2016-03-22 DIAGNOSIS — Z7984 Long term (current) use of oral hypoglycemic drugs: Secondary | ICD-10-CM | POA: Insufficient documentation

## 2016-03-22 DIAGNOSIS — E785 Hyperlipidemia, unspecified: Secondary | ICD-10-CM | POA: Insufficient documentation

## 2016-03-22 DIAGNOSIS — Z888 Allergy status to other drugs, medicaments and biological substances status: Secondary | ICD-10-CM | POA: Insufficient documentation

## 2016-03-22 MED ORDER — LIDOCAINE HCL (PF) 2 % IJ SOLN
INTRAMUSCULAR | Status: AC
  Filled 2016-03-22: qty 10

## 2016-03-22 NOTE — Progress Notes (Signed)
[facility] SHORT STAY H&P  CC:  Catheter removal   HPI:  This is a 65 y.o. female here for right IJ catheter removal.  She has a functioning right brachial cephalic fistula.    Past Medical History  Diagnosis Date  . Esophageal stricture   . Hyperlipidemia   . Hypertension   . Bronchitis   . CAD (coronary artery disease)     s/p Stenting in 2005;  [place] 7/09: Vigorous LV function, 2-3+ MR, RI 40%, RI stent patent, proximal-mid RCA 40-50%;  [place] 7/09:  EF 55-65%, trivial MR;  [place] 11/18/12: EF 66%, normal LV wall motion, mild to moderate anterior ischemia - reviewed by [facility] and felt to rep breast atten (low risk);  Echo 6/14: mild LVH, EF 55-65%, Gr 2 DD, PASP 44, trivial eff   . Arthritis   . IDDM (insulin dependent diabetes mellitus) ([place])   . Diverticulosis   . Diabetic neuropathy ([place])   . Vitamin D deficiency   . Degenerative joint disease     left shoulder  . Chronic neck pain   . Anemia     hx of  . Stroke [facility])     "mini- years ago"  . Chronic kidney disease     CREATININE IS UP--GOING TO KIDNEY MD   . CHF (congestive heart failure) ([place])   . Pericardial effusion 03/06/2015    Very small per echo ; pleural nodules seen on CT chest.  . Acute on chronic diastolic CHF (congestive heart failure) ([place]) 03/05/2015    Grade 2. EF 60-65%  . COPD (chronic obstructive pulmonary disease) ([place])   . Peripheral vascular disease ([place])   . Pneumonia   . Anxiety   . GERD (gastroesophageal reflux disease)   . Constipation     FH:  Non-Contributory  Social History   Social History  . Marital Status: Widowed    Spouse Name: N/A  . Number of Children: N/A  . Years of Education: N/A   Occupational History  . Not on file.   Social History Main Topics  . Smoking status: Former Smoker -- 1.00 packs/day for 30 years    Quit date: 12/04/2001  . Smokeless tobacco: Never Used  . Alcohol Use: 3.6 oz/week    6 Cans of beer per week     Comment: occasional  .  Drug Use: No  . Sexual Activity: No   Other Topics Concern  . Not on file   Social History Narrative   Lives in [place] Reactions  . Ace Inhibitors Cough  . Clopidogrel Bisulfate Other (See Comments)    REACTION: Sick, Headache, "Felt terrible"  . Codeine Other (See Comments)    REACTION: hallucinations  . Lisinopril Other (See Comments)    cough  . Penicillins Other (See Comments)    Has patient had a PCN reaction causing immediate rash, facial/tongue/throat swelling, SOB or lightheadedness with hypotension: unknown Has patient had a PCN reaction causing severe rash involving mucus membranes or skin necrosis: unknown Has patient had a PCN reaction that required hospitalization unknown Has patient had a PCN reaction occurring within the last 10 years:unknown If all of the above answers are "NO", then may proceed with Cephalosporin use.     Current Outpatient Prescriptions  Medication Sig Dispense Refill  . albuterol (PROVENTIL HFA;VENTOLIN HFA) 108 (90 BASE) MCG/ACT inhaler Inhale 2 puffs into the lungs every 6 (six) hours as needed for wheezing or shortness of breath. 1  Inhaler 2  . albuterol (PROVENTIL) (2.5 MG/3ML) 0.083% nebulizer solution Take 3 mLs (2.5 mg total) by nebulization every 6 (six) hours as needed for wheezing or shortness of breath. 75 mL 12  . aspirin 81 MG EC tablet Take 81 mg by mouth daily.      [name] atenolol (TENORMIN) 50 MG tablet Take 50 mg by mouth 2 (two) times daily.   2  . budesonide-formoterol (SYMBICORT) 160-4.5 MCG/ACT inhaler Inhale 2 puffs into the lungs 2 (two) times daily. 1 Inhaler 12  . Carboxymethylcellulose Sodium (THERATEARS OP) Place 2 drops into both eyes as needed (for dry eyes). Reported on 12/09/2015    . escitalopram (LEXAPRO) 10 MG tablet Take 1 tablet (10 mg total) by mouth daily. 90 tablet 3  . fluticasone (FLONASE) 50 MCG/ACT nasal spray Place 2 sprays into both nostrils daily.  98  . glucose blood test  strip Test 4 times daily 120 each 5  . hydrALAZINE (APRESOLINE) 50 MG tablet Take 1 tablet (50 mg total) by mouth 2 (two) times daily. 1 tablet 0  . Insulin Glargine (LANTUS SOLOSTAR) 100 UNIT/ML Solostar Pen Inject 25 Units into the skin daily at 10 pm. 15 mL 11  . insulin lispro (HUMALOG KWIKPEN) 100 UNIT/ML KiwkPen Take 5u before meals for BGL >150 15 mL 11  . isosorbide mononitrate (IMDUR) 60 MG 24 hr tablet Take 1 tablet (60 mg total) by mouth daily. 30 tablet 9  . LORazepam (ATIVAN) 0.5 MG tablet Take 1 tablet (0.5 mg total) by mouth every 8 (eight) hours. 30 tablet 4  . NIFEdipine (PROCARDIA XL/ADALAT-CC) 90 MG 24 hr tablet Take 90 mg by mouth every evening.     . nitroGLYCERIN (NITROSTAT) 0.4 MG SL tablet Place 1 tablet (0.4 mg total) under the tongue every 5 (five) minutes as needed for chest pain. (Patient not taking: Reported on 03/03/2016) 25 tablet 5  . pantoprazole (PROTONIX) 40 MG tablet TAKE 1 TABLET TWICE DAILY FOR GASTRIC ACID REFLUX. 60 tablet 4  . rosuvastatin (CRESTOR) 10 MG tablet Take 1 tablet (10 mg total) by mouth at bedtime. 30 tablet 5  . torsemide (DEMADEX) 20 MG tablet Take 2 tablets (40 mg total) by mouth daily. New medicine to reduce fluid build up. (Patient taking differently: Take 40-60 mg by mouth daily. **Take 20 mg extra tablet (60mg  total) if weight is over 3-4 pounds.) 60 tablet 3   Current Facility-Administered Medications  Medication Dose Route Frequency Provider Last Rate Last Dose  . lidocaine (XYLOCAINE) 2 % injection             ROS:  See HPI  PHYSICAL EXAM  Filed Vitals:   03/22/16 1256  BP: 148/45  Pulse: 61  Temp: 97.5 F (36.4 C)  Resp: 20    Gen:  Well developed well nourished HEENT:  normocephalic Neck:  Right IJ tunneled dialysis catheter  Lungs:  Non-labored Extremities:  Palpable thrill right brachial cephalic fistula. Some ecchymosis of upper arm.  Skin:  No obvious rashes Neuro:  No focal deficits  Impression: This is a 65  y.o. female here for right IJ tunneled dialysis catheter removal  Plan:  Removal of right tunneled dialysis catheter removal  Virgina Jock, PA-C [facility] [phone] 03/22/2016 2:27 PM

## 2016-03-22 NOTE — Progress Notes (Signed)
  [facility] Catheter Removal Procedure Note  Diagnosis: ESRD  Plan:  Remove right tunneled dialysis catheter  Consent signed:  Yes.   Time out completed:  Yes.   Coumadin:  No. PT/INR (if applicable):   Other labs:  Procedure: 1.  Sterile prepping and draping over catheter area 2. 5 ml 2% lidocaine plain instilled at removal site. 3.  right catheter removed in its entirety with cuff in tact. 4.  Complications:  none 5. Tip of catheter sent for culture:  No.   Patient tolerated procedure well:  Yes.   Pressure held, no bleeding noted, dressing applied Instructions given to the pt regarding wound care and bleeding.  Other:  Virgina Jock, PA-C 03/22/2016 2:30 PM

## 2016-06-08 LAB — HEMOGLOBIN A1C: Hemoglobin A1C: 6.2

## 2016-06-09 ENCOUNTER — Encounter: Payer: Self-pay | Admitting: Pediatrics

## 2016-06-09 ENCOUNTER — Ambulatory Visit (INDEPENDENT_AMBULATORY_CARE_PROVIDER_SITE_OTHER): Payer: Medicare Other | Admitting: Pediatrics

## 2016-06-09 VITALS — BP 124/61 | HR 60 | Temp 97.5°F | Ht 63.0 in | Wt 185.0 lb

## 2016-06-09 DIAGNOSIS — I10 Essential (primary) hypertension: Secondary | ICD-10-CM | POA: Diagnosis not present

## 2016-06-09 DIAGNOSIS — E1121 Type 2 diabetes mellitus with diabetic nephropathy: Secondary | ICD-10-CM

## 2016-06-09 DIAGNOSIS — J449 Chronic obstructive pulmonary disease, unspecified: Secondary | ICD-10-CM

## 2016-06-09 DIAGNOSIS — E785 Hyperlipidemia, unspecified: Secondary | ICD-10-CM | POA: Diagnosis not present

## 2016-06-09 DIAGNOSIS — N184 Chronic kidney disease, stage 4 (severe): Secondary | ICD-10-CM | POA: Diagnosis not present

## 2016-06-09 DIAGNOSIS — I25119 Atherosclerotic heart disease of native coronary artery with unspecified angina pectoris: Secondary | ICD-10-CM | POA: Diagnosis not present

## 2016-06-09 DIAGNOSIS — K5901 Slow transit constipation: Secondary | ICD-10-CM | POA: Diagnosis not present

## 2016-06-09 MED ORDER — PRAVASTATIN SODIUM 40 MG PO TABS: 40.0000 mg | ORAL_TABLET | Freq: Every day | ORAL | Status: DC

## 2016-06-09 MED ORDER — INSULIN GLARGINE 100 UNIT/ML SOLOSTAR PEN: 20.0000 [IU] | PEN_INJECTOR | Freq: Every day | SUBCUTANEOUS | Status: DC

## 2016-06-09 NOTE — Progress Notes (Addendum)
Subjective:    Patient ID: [name], female    DOB: 1951-10-08, 65 y.o.   MRN: [id number]  CC: Follow-up and Leg Pain   HPI: [name] is a 65 y.o. female presenting for Follow-up and Leg Pain  DM2: HgA1c 6.2 yesterday at diaylsis Morning BGLs: around 100, 120 Not checking throughout the day  PAD: has appointment in Aug with vascular  Weakness in legs: feels fine going into diaylsis, about half way through starts having leg cramps and then often has to end dialysis early due to discomfort in her legs. Doesn't have this pain any other time.  CKD: making urine, taking her diuretics, on dialysis  No chest pain, headaches   Depression screen [facility] 2/9 06/09/2016 03/03/2016 01/07/2016 12/09/2015 11/08/2015  Decreased Interest 0 0 0 0 0  Down, Depressed, Hopeless 0 0 0 0 0  PHQ - 2 Score 0 0 0 0 0     Relevant past medical, surgical, family and social history reviewed and updated as indicated.  Interim medical history since our last visit reviewed. Allergies and medications reviewed and updated.  ROS: Per HPI unless specifically indicated above  History  Smoking status  . Former Smoker -- 1.00 packs/day for 30 years  . Quit date: 12/04/2001  Smokeless tobacco  . Never Used       Objective:    BP 124/61 mmHg  Pulse 60  Temp(Src) 97.5 F (36.4 C) (Oral)  Ht 5\' 3"  (1.6 m)  Wt 185 lb (83.915 kg)  BMI 32.78 kg/m2  Wt Readings from Last 3 Encounters:  06/09/16 185 lb (83.915 kg)  03/22/16 185 lb (83.915 kg)  03/03/16 191 lb 3.2 oz (86.728 kg)     Gen: NAD, alert, cooperative with exam, NCAT EYES: EOMI, no scleral injection or icterus ENT:  TMs pearly gray b/l, OP without erythema LYMPH: no cervical LAD CV: NRRR, normal S1/S2, no murmur Resp: CTABL, no wheezes, normal WOB Abd: +BS, soft, NTND. no guarding or organomegaly Ext: No edema, warm, <3sec cap refill, not able to palpate right DP pulse, L very faintly Neuro: Alert and oriented, strength equal b/l  UE and LE, coordination grossly normal MSK: normal muscle bulk     Assessment & Plan:    [name] was seen today for follow-up and leg pain.  Diagnoses and all orders for this visit:  PAD Leg pain starting during dialysis may be claudication Has f/u with vascular surg coming up  HYPERTENSION, BENIGN Well controlled today, cont meds  Atherosclerosis of native coronary artery of native heart with angina pectoris (HCC) Asymptomatic, continue ASA, BB  Chronic obstructive pulmonary disease, unspecified COPD type ([place]) Well controlled currently  Chronic kidney disease, stage IV (severe) ([place]) On dialysis in [place]  Type II diabetes mellitus with nephropathy ([place]) Decrease to 20 u at night. Will elt me know if regularly having elevated AM BGLs -     Insulin Glargine (LANTUS SOLOSTAR) 100 UNIT/ML Solostar Pen; Inject 20 Units into the skin daily at 10 pm.  Slow transit constipation  Hyperlipidemia Was on crestor, not taking it because of muscle aches.  Will switch to pravastatin, start at 40mg , will increase if able next visit.  Lipid panel today. -     pravastatin (PRAVACHOL) 40 MG tablet; Take 1 tablet (40 mg total) by mouth daily. -     Lipid panel     Follow up plan: Return in about 3 months (around 09/09/2016).  Tamara Found, MD [name] [place] [facility] 06/09/2016, 10:17 AM

## 2016-06-10 LAB — LIPID PANEL
Chol/HDL Ratio: 3.3 ratio units (ref 0.0–4.4)
Cholesterol, Total: 138 mg/dL (ref 100–199)
HDL: 42 mg/dL (ref >39)
LDL Calculated: 77 mg/dL (ref 0–99)
Triglycerides: 95 mg/dL (ref 0–149)
VLDL Cholesterol Cal: 19 mg/dL (ref 5–40)

## 2016-06-12 ENCOUNTER — Encounter: Payer: Self-pay | Admitting: *Deleted

## 2016-07-06 ENCOUNTER — Encounter: Payer: Self-pay | Admitting: Vascular Surgery

## 2016-07-12 ENCOUNTER — Ambulatory Visit (INDEPENDENT_AMBULATORY_CARE_PROVIDER_SITE_OTHER): Payer: Medicare Other | Admitting: Vascular Surgery

## 2016-07-12 ENCOUNTER — Encounter: Payer: Self-pay | Admitting: Vascular Surgery

## 2016-07-12 ENCOUNTER — Ambulatory Visit (HOSPITAL_COMMUNITY)
Admission: RE | Admit: 2016-07-12 | Discharge: 2016-07-12 | Disposition: A | Discharge status: Home / Self Care | Payer: Medicare Other | Source: Ambulatory Visit | Attending: Vascular Surgery | Admitting: Vascular Surgery

## 2016-07-12 VITALS — BP 140/75 | HR 62 | Temp 98.0°F | Resp 20 | Ht 63.0 in | Wt 181.0 lb

## 2016-07-12 DIAGNOSIS — E1122 Type 2 diabetes mellitus with diabetic chronic kidney disease: Secondary | ICD-10-CM | POA: Insufficient documentation

## 2016-07-12 DIAGNOSIS — I132 Hypertensive heart and chronic kidney disease with heart failure and with stage 5 chronic kidney disease, or end stage renal disease: Secondary | ICD-10-CM | POA: Diagnosis not present

## 2016-07-12 DIAGNOSIS — J449 Chronic obstructive pulmonary disease, unspecified: Secondary | ICD-10-CM | POA: Insufficient documentation

## 2016-07-12 DIAGNOSIS — N185 Chronic kidney disease, stage 5: Secondary | ICD-10-CM | POA: Insufficient documentation

## 2016-07-12 DIAGNOSIS — E1140 Type 2 diabetes mellitus with diabetic neuropathy, unspecified: Secondary | ICD-10-CM | POA: Insufficient documentation

## 2016-07-12 DIAGNOSIS — R938 Abnormal findings on diagnostic imaging of other specified body structures: Secondary | ICD-10-CM | POA: Insufficient documentation

## 2016-07-12 DIAGNOSIS — F419 Anxiety disorder, unspecified: Secondary | ICD-10-CM | POA: Insufficient documentation

## 2016-07-12 DIAGNOSIS — K219 Gastro-esophageal reflux disease without esophagitis: Secondary | ICD-10-CM | POA: Diagnosis not present

## 2016-07-12 DIAGNOSIS — I2510 Atherosclerotic heart disease of native coronary artery without angina pectoris: Secondary | ICD-10-CM | POA: Diagnosis not present

## 2016-07-12 DIAGNOSIS — I5033 Acute on chronic diastolic (congestive) heart failure: Secondary | ICD-10-CM | POA: Insufficient documentation

## 2016-07-12 DIAGNOSIS — I739 Peripheral vascular disease, unspecified: Secondary | ICD-10-CM | POA: Diagnosis not present

## 2016-07-12 DIAGNOSIS — E785 Hyperlipidemia, unspecified: Secondary | ICD-10-CM | POA: Diagnosis not present

## 2016-07-12 DIAGNOSIS — E1151 Type 2 diabetes mellitus with diabetic peripheral angiopathy without gangrene: Secondary | ICD-10-CM | POA: Insufficient documentation

## 2016-07-12 DIAGNOSIS — I70213 Atherosclerosis of native arteries of extremities with intermittent claudication, bilateral legs: Secondary | ICD-10-CM | POA: Diagnosis not present

## 2016-07-12 DIAGNOSIS — R0989 Other specified symptoms and signs involving the circulatory and respiratory systems: Secondary | ICD-10-CM | POA: Diagnosis present

## 2016-07-12 NOTE — Progress Notes (Signed)
Patient ID: [name], female   DOB: [birth year], 65 y.o.   MRN: [id number] Per verbal order, I called [name] [phone], and asked [name], RN to put a note on this patient's chart. They will start alternating areas to cannulize this AVF. They are currently only access 2 small areas but Dr. Scot Dock showed the patient alternate sites both distally and proximately to use. [name] will place instruction in the patient's chart for all techs to see.

## 2016-07-12 NOTE — Patient Instructions (Signed)
Vascular Access for Hemodialysis A vascular access is a connection between two blood vessels that allows blood to be easily removed from the body and returned to the body during hemodialysis. Hemodialysis is a procedure in which a machine outside of the body filters the blood. There are three types of vascular accesses:   Arteriovenous fistula. This is a connection between an artery and a vein (usually in the arm) that is made by sewing them together. Blood in the artery flows directly into the vein, causing it to get larger over time. This makes it easier for the vein to be used for hemodialysis. An arteriovenous fistula takes 1-6 months to develop after surgery.   Arteriovenous graft. This is a connection between an artery and a vein in the arm that is made with a tube. An arteriovenous graft can be used within 2-3 weeks of surgery.   Venous catheter. This is a thin, flexible tube that is placed in a large vein (usually in the neck, chest, or groin). A venous catheter for hemodialysis contains two tubes that come out of the skin. A venous catheter can be used right away. It is usually used as a temporary access if you need hemodialysis before a fistula or graft has developed. It may also be used as a permanent access if a fistula or graft cannot be created. WHICH TYPE OF ACCESS IS BEST FOR ME? The type of access that is best for you depends on the size and strength of your veins.  A fistula is usually the preferred type of access. It can last several years and is less likely than the other types of accesses to become infected or to cause blood clots within a blood vessel (thrombosis). However, a fistula is not an option for everyone. If your veins are not the right size, a graft may be used instead. Grafts require you to have strong veins. If your veins are not strong enough for a graft, a catheter may be used. Catheters are more likely than fistulas and grafts to become infected or to have thrombosis.   Sometimes, only one type of access is an option. Your health care provider will help you determine which type of access is best for you.  HOW IS A VASCULAR ACCESS USED? The way the access is used depends on the type of access:   If the access is a fistula or graft, two needles are inserted through the skin into the access before each hemodialysis session. Blood leaves the body through one of the needles and travels through a tube to the hemodialysis machine (dialyzer). It then flows through another tube and returns to the body through the second needle.   If the access is a catheter, one tube is connected directly to the tube that leads to the dialyzer and the other is connected to a tube that leads away from the dialyzer. Blood leaves the body through one tube and returns to the body through the other.  WHAT KIND OF PROBLEMS CAN OCCUR WITH VASCULAR ACCESSES?  Blood clots within a blood vessel (thrombosis). Thrombosis can lead to a narrowing of a blood vessel or tube (stenosis). If thrombosis occurs frequently, another access site may be created as a backup.   Infection.  These problems are most likely to occur with a venous catheter and least likely to occur with an arteriovenous fistula.  HOW DO I CARE FOR MY VASCULAR ACCESS? Wear a medical alert bracelet. This tells health care providers that you are   a dialysis patient in the case of an emergency and allows them to care for your veins appropriately. If you have a graft or fistula:   A "bruit" is a noise that is heard with a stethoscope and a "thrill" is a vibration felt over the graft or fistula. The presence of the bruit and thrill indicates that the access is working. You will be taught to feel for the thrill each day. If this is not felt, the access may be clotted. Call your health care provider.   You may use the arm where your vascular access is located freely after the site heals. Keep the following in mind:   Avoid pressure on  the arm.   Avoid lifting heavy objects with the arm.   Avoid sleeping on the arm.   Avoid wearing tight-sleeved shirts or jewelry around the graft or fistula.   Do not allow blood pressure monitoring or needle punctures on the side where the graft or fistula is located.   With permission from your health care provider, you may do exercises to help with blood flow through a fistula. These exercises involve squeezing a rubber ball or other soft objects as instructed. SEEK MEDICAL CARE IF:   Chills develop.   You have an oral temperature above 102 F (38.9 C).  Swelling around the graft or fistula gets worse.   New pain develops.   Pus or other fluid (drainage) is seen at the vascular access site.   Skin redness or red streaking is seen on the skin around, above, or below the vascular access. SEEK IMMEDIATE MEDICAL CARE IF:   Pain, numbness, or an unusual pale skin color develops in the hand on the side of your fistula.   Dizziness or weakness develops that you have not had before.   The vascular access has bleeding that cannot be easily controlled.   This information is not intended to replace advice given to you by your health care provider. Make sure you discuss any questions you have with your health care provider.   Document Released: 02/10/2003 Document Revised: 12/11/2014 Document Reviewed: 04/08/2013 Elsevier Interactive Patient Education 2016 [employer]  

## 2016-07-12 NOTE — Progress Notes (Signed)
HISTORY AND PHYSICAL     CC:  F/u for leg pain Referring Provider:  Eustaquio Maize, MD  HPI: This is a 65 y.o. female who has been followed by Dr. Scot Dock in the past for HD access.  She states that she is doing well on HD and her fistula is working well.  She does have complaints of them sticking the same place every single time instead of rotating the needle site.  She states that while she is on HD, her legs feel heavy and numb and she wiggles her toes around while she is on the machine.  She is dialyzing without difficulty.  She is here today for follow up for her leg pain.  She states that when she stands up, her legs feel heavy and weak when she walks.  When asked specifically about cramping in her legs, she does not have any of this.  She states that she does have some swelling at the end of the day, but this does get better.  She does not have any non healing wounds on her feet.  She later describes that when she walks, if she stops, the heaviness gets better and then she continues walking.  She specifically denies pain waking her up at night.   She is on insulin for diabetes and her last hgbA1c was 6.2.  She is on a statin for cholesterol management.  She is on a beta blocker and CCB for blood pressure control.  She is ESRD and on HD.  She does not smoke--only remote hx as she quit in 2003.  She states she has had some shortness of breath lately and feels they are not getting enough fluid off at HD.    Past Medical History:  Diagnosis Date  . Acute on chronic diastolic CHF (congestive heart failure) ([place]) 03/05/2015   Grade 2. EF 60-65%  . Anemia    hx of  . Anxiety   . Arthritis   . Bronchitis   . CAD (coronary artery disease)    s/p Stenting in 2005;  [place] 7/09: Vigorous LV function, 2-3+ MR, RI 40%, RI stent patent, proximal-mid RCA 40-50%;  [place] 7/09:  EF 55-65%, trivial MR;  [place] 11/18/12: EF 66%, normal LV wall motion, mild to moderate anterior ischemia - reviewed by [facility]  and felt to rep breast atten (low risk);  Echo 6/14: mild LVH, EF 55-65%, Gr 2 DD, PASP 44, trivial eff   . CHF (congestive heart failure) ([place])   . Chronic kidney disease    CREATININE IS UP--GOING TO KIDNEY MD   . Chronic neck pain   . Constipation   . COPD (chronic obstructive pulmonary disease) ([place])   . Degenerative joint disease    left shoulder  . Diabetic neuropathy ([place])   . Diverticulosis   . Esophageal stricture   . GERD (gastroesophageal reflux disease)   . Hyperlipidemia   . Hypertension   . IDDM (insulin dependent diabetes mellitus) ([place])   . Pericardial effusion 03/06/2015   Very small per echo ; pleural nodules seen on CT chest.  . Peripheral vascular disease ([place])   . Pneumonia   . Stroke [facility])    "mini- years ago"  . Vitamin D deficiency     Past Surgical History:  Procedure Laterality Date  . ABDOMINAL HYSTERECTOMY    . AV FISTULA PLACEMENT Right 07/20/2015   Procedure: RADIAL CEPHALIC ARTERIOVENOUS FISTULA CREATION RIGHT ARM;  Surgeon: Angelia Mould, MD;  Location: [place];  Service: Vascular;  Laterality: Right;  . AV FISTULA PLACEMENT Right 11/26/2015   Procedure:  Creation of RIGHT BRACHIO-CEPHALIC arteriovenous fistula;  Surgeon: Angelia Mould, MD;  Location: [place];  Service: Vascular;  Laterality: Right;  . BACK SURGERY     2007 & 2013  . BLADDER SURGERY     Tack  . CARDIAC CATHETERIZATION  2003, 2005, 2009   1 stent  . cardiac stents    . CATARACT EXTRACTION W/PHACO Right 05/04/2014   Procedure: CATARACT EXTRACTION PHACO AND INTRAOCULAR LENS PLACEMENT (IOC);  Surgeon: Williams Che, MD;  Location: [facility];  Service: Ophthalmology;  Laterality: Right;  CDE 1.47  . CATARACT EXTRACTION W/PHACO Left 07/20/2014   Procedure: CATARACT EXTRACTION WITH PHACO AND INTRAOCULAR LENS PLACEMENT LEFT EYE CDE=1.79;  Surgeon: Williams Che, MD;  Location: [facility];  Service: Ophthalmology;  Laterality: Left;  . CERVICAL SPINE SURGERY  2012   8 screws  .  CYSTOSCOPY WITH INJECTION N/A 06/03/2013   Procedure: MACROPLASTIQUE  INJECTION ;  Surgeon: Malka So, MD;  Location: [facility];  Service: Urology;  Laterality: N/A;  . INSERTION OF DIALYSIS CATHETER N/A 11/26/2015   Procedure: INSERTION OF DIALYSIS CATHETER;  Surgeon: Angelia Mould, MD;  Location: [place];  Service: Vascular;  Laterality: N/A;  . LAMINECTOMY  12/29/2011  . LUMBAR LAMINECTOMY/DECOMPRESSION MICRODISCECTOMY  12/29/2011   Procedure: LUMBAR LAMINECTOMY/DECOMPRESSION MICRODISCECTOMY;  Surgeon: Floyce Stakes, MD;  Location: [place] [facility];  Service: Neurosurgery;  Laterality: N/A;  Lumbar Two through Lumbar Five Laminectomies/Cellsaver  . PARTIAL HYSTERECTOMY    . PERIPHERAL VASCULAR CATHETERIZATION N/A 11/22/2015   Procedure: Fistulagram;  Surgeon: Angelia Mould, MD;  Location: [place] [facility];  Service: Cardiovascular;  Laterality: N/A;  . SHOULDER SURGERY Right    Rotator cuff    Allergies  Allergen Reactions  . Ace Inhibitors Cough  . Clopidogrel Bisulfate Other (See Comments)    REACTION: Sick, Headache, "Felt terrible"  . Codeine Other (See Comments)    REACTION: hallucinations  . Lisinopril Other (See Comments)    cough  . Penicillins Other (See Comments)    Has patient had a PCN reaction causing immediate rash, facial/tongue/throat swelling, SOB or lightheadedness with hypotension: unknown Has patient had a PCN reaction causing severe rash involving mucus membranes or skin necrosis: unknown Has patient had a PCN reaction that required hospitalization unknown Has patient had a PCN reaction occurring within the last 10 years:unknown If all of the above answers are "NO", then may proceed with Cephalosporin use.     Current Outpatient Prescriptions  Medication Sig Dispense Refill  . albuterol (PROVENTIL HFA;VENTOLIN HFA) 108 (90 BASE) MCG/ACT inhaler Inhale 2 puffs into the lungs every 6 (six) hours as needed for wheezing or shortness of breath. 1  Inhaler 2  . albuterol (PROVENTIL) (2.5 MG/3ML) 0.083% nebulizer solution Take 3 mLs (2.5 mg total) by nebulization every 6 (six) hours as needed for wheezing or shortness of breath. 75 mL 12  . aspirin 81 MG EC tablet Take 81 mg by mouth daily.      [name] atenolol (TENORMIN) 50 MG tablet Take 50 mg by mouth 2 (two) times daily.   2  . budesonide-formoterol (SYMBICORT) 160-4.5 MCG/ACT inhaler Inhale 2 puffs into the lungs 2 (two) times daily. 1 Inhaler 12  . escitalopram (LEXAPRO) 10 MG tablet Take 1 tablet (10 mg total) by mouth daily. 90 tablet 3  . fluticasone (FLONASE) 50 MCG/ACT nasal spray Place 2 sprays into  both nostrils daily.  98  . glucose blood test strip Test 4 times daily 120 each 5  . hydrALAZINE (APRESOLINE) 50 MG tablet Take 1 tablet (50 mg total) by mouth 2 (two) times daily. 1 tablet 0  . Insulin Glargine (LANTUS SOLOSTAR) 100 UNIT/ML Solostar Pen Inject 20 Units into the skin daily at 10 pm. 15 mL 11  . isosorbide mononitrate (IMDUR) 60 MG 24 hr tablet Take 1 tablet (60 mg total) by mouth daily. 30 tablet 9  . LORazepam (ATIVAN) 0.5 MG tablet Take 1 tablet (0.5 mg total) by mouth every 8 (eight) hours. 30 tablet 4  . NIFEdipine (PROCARDIA XL/ADALAT-CC) 90 MG 24 hr tablet Take 90 mg by mouth every evening.     . nitroGLYCERIN (NITROSTAT) 0.4 MG SL tablet Place 1 tablet (0.4 mg total) under the tongue every 5 (five) minutes as needed for chest pain. 25 tablet 5  . pravastatin (PRAVACHOL) 40 MG tablet Take 1 tablet (40 mg total) by mouth daily. 90 tablet 3  . torsemide (DEMADEX) 20 MG tablet Take 2 tablets (40 mg total) by mouth daily. New medicine to reduce fluid build up. (Patient taking differently: Take 40-60 mg by mouth daily. **Take 20 mg extra tablet (60mg  total) if weight is over 3-4 pounds.) 60 tablet 3  . Carboxymethylcellulose Sodium (THERATEARS OP) Place 2 drops into both eyes as needed (for dry eyes). Reported on 12/09/2015    . insulin lispro (HUMALOG KWIKPEN) 100 UNIT/ML  KiwkPen Take 5u before meals for BGL >150 (Patient not taking: Reported on 07/12/2016) 15 mL 11  . pantoprazole (PROTONIX) 40 MG tablet TAKE 1 TABLET TWICE DAILY FOR GASTRIC ACID REFLUX. (Patient not taking: Reported on 07/12/2016) 60 tablet 4   No current facility-administered medications for this visit.     Family History  Problem Relation Age of Onset  . Heart disease Other   . Hypertension Father   . Cancer Father   . Hypertension Daughter     Social History   Social History  . Marital status: Widowed    Spouse name: N/A  . Number of children: N/A  . Years of education: N/A   Occupational History  . Not on file.   Social History Main Topics  . Smoking status: Former Smoker    Packs/day: 1.00    Years: 30.00    Quit date: 12/04/2001  . Smokeless tobacco: Never Used  . Alcohol use 3.6 oz/week    6 Cans of beer per week     Comment: occasional  . Drug use: No  . Sexual activity: No   Other Topics Concern  . Not on file   Social History Narrative   Lives in [place]:   [X]  denotes positive finding, [ ]  denotes negative finding Cardiac  Comments:  Chest pain or chest pressure:    Shortness of breath upon exertion: x   Short of breath when lying flat: x   Irregular heart rhythm:        Vascular    Pain in calf, thigh, or hip brought on by ambulation: x   Pain in feet at night that wakes you up from your sleep:     Blood clot in your veins:    Leg swelling:  x Around the ankles      Pulmonary    Oxygen at home:    Productive cough:     Wheezing:         Neurologic  Sudden weakness in arms or legs:     Sudden numbness in arms or legs:     Sudden onset of difficulty speaking or slurred speech:    Temporary loss of vision in one eye:     Problems with dizziness:         Gastrointestinal    Blood in stool:     Vomited blood:         Genitourinary    Burning when urinating:     Blood in urine:        Psychiatric    Major  depression:         Hematologic    Bleeding problems:    Problems with blood clotting too easily:        Skin    Rashes or ulcers:        Constitutional    Fever or chills:      PHYSICAL EXAMINATION:  Vitals:   07/12/16 1359 07/12/16 1402  BP: (!) 143/73 140/75  Pulse: 62   Resp: 20   Temp: 98 F (36.7 C)    Body mass index is 32.06 kg/m.  General:  WDWN in NAD; vital signs documented above Gait: Not observed HENT: WNL, normocephalic Pulmonary: normal non-labored breathing , without Rales, rhonchi,  wheezing Cardiac: regular HR, without  Murmurs, rubs or gallops; without carotid bruits Abdomen: soft, NT, no masses Skin: 2 scars on her left neck that are healed (one was a cyst and she cannot remember what the other is from) Vascular Exam/Pulses:  Right Left  Radial absent 2+ (normal)  Ulnar absent 2+ (normal)  Femoral 2+ (normal) 2+ (normal)  Popliteal Unable to palpate  Unable to palpate   DP Unable to palpate  Unable to palpate   PT Unable to palpate  Unable to palpate    Extremities: without ischemic changes, without Gangrene , without cellulitis; without open wounds; RUA AVF has a good thrill/bruit within the fistula.   Musculoskeletal: no muscle wasting or atrophy  Neurologic: A&O X 3;  No focal weakness or paresthesias are detected Psychiatric:  The pt has Normal affect.   Non-Invasive Vascular Imaging:   ABI's 07/12/16: Right:  0.75 Left:  0.85  TBI's 07/12/16: Right:  0.64 Left:  0.69  Pt meds includes: Statin:  Yes.   Beta Blocker:  Yes.   Aspirin:  Yes.   ACEI:  No. ARB:  No. Other Antiplatelet/Anticoagulant:  No.    ASSESSMENT/PLAN:: 65 y.o. female with ESRD who presents for evaluation of leg pain/fatigue.    -pt's ABI's are moderately reduced on the right and mildly reduced on the left.  She does not have any wounds or areas of concern for non healing. -she describes her pain as fatigue and heaviness.  She states that this does  improve when she stops to rest.   -Dr. Scot Dock feels that her symptoms that she describes are claudicationand  are from her PAD.  He has encouraged her continue walking and walk for at least 30 minutes/day to develop collaterals in hope that this will improve her sx.  -given that her ABI's are mildly reduced and she does not smoke, we will see her back prn or if she has a non healing wound. -he has also encouraged her to have the dialysis tech to rotate the site where she is stuck for dialysis    Leontine Locket, PA-C [facility] [phone]  Clinic MD:  Pt seen and examined in conjunction with Dr. Scot Dock

## 2016-08-08 ENCOUNTER — Ambulatory Visit (INDEPENDENT_AMBULATORY_CARE_PROVIDER_SITE_OTHER): Payer: Medicare Other | Admitting: Nurse Practitioner

## 2016-08-08 ENCOUNTER — Ambulatory Visit: Payer: Medicare Other | Admitting: Physician Assistant

## 2016-08-08 VITALS — BP 146/80 | HR 63 | Temp 98.4°F | Ht 63.0 in | Wt 177.0 lb

## 2016-08-08 DIAGNOSIS — R0602 Shortness of breath: Secondary | ICD-10-CM | POA: Diagnosis not present

## 2016-08-08 DIAGNOSIS — J209 Acute bronchitis, unspecified: Secondary | ICD-10-CM | POA: Diagnosis not present

## 2016-08-08 DIAGNOSIS — J42 Unspecified chronic bronchitis: Secondary | ICD-10-CM | POA: Diagnosis not present

## 2016-08-08 MED ORDER — METHYLPREDNISOLONE ACETATE 80 MG/ML IJ SUSP
80.0000 mg | Freq: Once | INTRAMUSCULAR | Status: AC
  Administered 2016-08-08: 80 mg via INTRAMUSCULAR

## 2016-08-08 MED ORDER — PREDNISONE 20 MG PO TABS: ORAL_TABLET | ORAL | 0 refills | Status: DC

## 2016-08-08 MED ORDER — LEVALBUTEROL HCL 1.25 MG/3ML IN NEBU
1.2500 mg | INHALATION_SOLUTION | RESPIRATORY_TRACT | Status: AC
  Administered 2016-08-08: 1.25 mg via RESPIRATORY_TRACT

## 2016-08-08 NOTE — Progress Notes (Signed)
   Subjective:    Patient ID: [name], female    DOB: 10/21/1951, 65 y.o.   MRN: [id number]  HPI Patient called earlier to day with SOB but did not want to come in earlier today.HAs been sick since last Tuesday morning with coughing and congestion. Dialysis people put her on a zpak and that has not  Helped. She denies fever.    Review of Systems  Constitutional: Negative for chills and fever.  HENT: Positive for congestion. Negative for ear pain, sinus pressure, trouble swallowing and voice change.   Respiratory: Positive for cough.   Cardiovascular: Negative.   Gastrointestinal: Negative.   Genitourinary: Negative.   Musculoskeletal: Negative.   Neurological: Negative.   Psychiatric/Behavioral: Negative.   All other systems reviewed and are negative.      Objective:   Physical Exam  Constitutional: She is oriented to person, place, and time. She appears well-developed and well-nourished. No distress.  Cardiovascular: Normal rate, regular rhythm and normal heart sounds.   Pulmonary/Chest: Effort normal. She has wheezes (inspiratory and expiratory wheezes throughout).  Neurological: She is alert and oriented to person, place, and time.  Skin: Skin is warm.  Psychiatric: She has a normal mood and affect. Her behavior is normal. Judgment and thought content normal.    BP (!) 146/80 (BP Location: Left Arm, Cuff Size: Normal)   Pulse 63   Temp 98.4 F (36.9 C) (Oral)   Ht 5\' 3"  (1.6 m)   Wt 177 lb (80.3 kg)   SpO2 96%   BMI 31.35 kg/m   S/P nebulizer treatment- still has insp and exp wheezes throughout but are looser sounding- pulse ox 100% roomair.      Assessment & Plan:   1. SOB (shortness of breath)   2. Acute exacerbation of chronic bronchitis ([place])    Continue neb treatments at home Finish zithromax as rx mucoinex OTC  start prednisone tomorrow- getting shot today Follow up Thursday  Mary-Margaret Hassell Done, FNP

## 2016-08-10 ENCOUNTER — Ambulatory Visit (INDEPENDENT_AMBULATORY_CARE_PROVIDER_SITE_OTHER): Payer: Medicare Other

## 2016-08-10 ENCOUNTER — Ambulatory Visit (INDEPENDENT_AMBULATORY_CARE_PROVIDER_SITE_OTHER): Payer: Medicare Other | Admitting: Nurse Practitioner

## 2016-08-10 ENCOUNTER — Encounter: Payer: Self-pay | Admitting: Nurse Practitioner

## 2016-08-10 VITALS — BP 155/70 | HR 72 | Temp 97.2°F | Ht 63.0 in | Wt 176.0 lb

## 2016-08-10 DIAGNOSIS — J42 Unspecified chronic bronchitis: Secondary | ICD-10-CM

## 2016-08-10 DIAGNOSIS — J209 Acute bronchitis, unspecified: Secondary | ICD-10-CM | POA: Diagnosis not present

## 2016-08-10 NOTE — Progress Notes (Signed)
   Subjective:    Patient ID: [name], female    DOB: Jul 23, 1951, 65 y.o.   MRN: [id number]  HPI Patient comes in today for follow up - she was seen on 9/5/17with COPD exacerbation- she was already on z pak when seen so she was given xopenex breathing treatment and steroid shot- was given steroid rx to start on yesterday. Shew says that she feels much better- still coughing some but no SOB.    Review of Systems  Constitutional: Negative.   HENT: Negative.   Respiratory: Positive for cough. Negative for shortness of breath.   Cardiovascular: Negative.   Gastrointestinal: Negative.   Genitourinary: Negative.   Neurological: Negative.   Psychiatric/Behavioral: Negative.   All other systems reviewed and are negative.      Objective:   Physical Exam  Constitutional: She is oriented to person, place, and time. She appears well-developed and well-nourished. No distress.  Cardiovascular: Normal rate and normal heart sounds.   Pulmonary/Chest: Effort normal. She has wheezes (tight expiratory wheezes throughout- worseon eft then right.).  Neurological: She is alert and oriented to person, place, and time.  Skin: Skin is warm.  Psychiatric: She has a normal mood and affect. Her behavior is normal. Judgment and thought content normal.    BP (!) 155/70 (BP Location: Left Arm, Cuff Size: Normal)   Pulse 72   Temp 97.2 F (36.2 C) (Oral)   Ht 5\' 3"  (1.6 m)   Wt 176 lb (79.8 kg)   SpO2 97%   BMI 31.18 kg/m   Chest x ray-cardiomegaly with chronic bronchitic changes-Preliminary reading by Ronnald Collum, FNP  [facility]           Assessment & Plan:   1. Acute exacerbation of chronic bronchitis ([place])    Continue al meds Take blood presure med as soon as get home Follow up in 1 week if no better  Mary-Margaret Hassell Done, FNP

## 2016-08-12 ENCOUNTER — Other Ambulatory Visit: Payer: Self-pay | Admitting: Cardiology

## 2016-08-14 NOTE — Telephone Encounter (Signed)
Rx request sent to pharmacy.  

## 2016-08-22 NOTE — Progress Notes (Signed)
HPI The patient presents for follow up of CAD and diastolic heart failure with diastolic dysfunction.   Since I last saw her she has done very well.  The patient denies any new symptoms such as chest discomfort, neck or arm discomfort. There has been no new shortness of breath, PND or orthopnea. There have been no reported palpitations, presyncope or syncope.  She says that she does occasionally have some low heart rates at dialysis. She has a little bit of leg pain and is followed by Dr. Scot Dock for this.  She has yet to start a walking regimen which has been suggested.  Allergies  Allergen Reactions  . Ace Inhibitors Cough  . Clopidogrel Bisulfate Other (See Comments)    REACTION: Sick, Headache, "Felt terrible"  . Codeine Other (See Comments)    REACTION: hallucinations  . Lisinopril Other (See Comments)    cough  . Penicillins Other (See Comments)    Has patient had a PCN reaction causing immediate rash, facial/tongue/throat swelling, SOB or lightheadedness with hypotension: unknown Has patient had a PCN reaction causing severe rash involving mucus membranes or skin necrosis: unknown Has patient had a PCN reaction that required hospitalization unknown Has patient had a PCN reaction occurring within the last 10 years:unknown If all of the above answers are "NO", then may proceed with Cephalosporin use.     Current Outpatient Prescriptions  Medication Sig Dispense Refill  . albuterol (PROVENTIL HFA;VENTOLIN HFA) 108 (90 BASE) MCG/ACT inhaler Inhale 2 puffs into the lungs every 6 (six) hours as needed for wheezing or shortness of breath. 1 Inhaler 2  . albuterol (PROVENTIL) (2.5 MG/3ML) 0.083% nebulizer solution Take 3 mLs (2.5 mg total) by nebulization every 6 (six) hours as needed for wheezing or shortness of breath. 75 mL 12  . aspirin 81 MG EC tablet Take 81 mg by mouth daily.      [name] atenolol (TENORMIN) 50 MG tablet Take 50 mg by mouth 2 (two) times daily.   2  .  budesonide-formoterol (SYMBICORT) 160-4.5 MCG/ACT inhaler Inhale 2 puffs into the lungs 2 (two) times daily. 1 Inhaler 12  . Carboxymethylcellulose Sodium (THERATEARS OP) Place 2 drops into both eyes as needed (for dry eyes). Reported on 12/09/2015    . escitalopram (LEXAPRO) 10 MG tablet Take 1 tablet (10 mg total) by mouth daily. 90 tablet 3  . fluticasone (FLONASE) 50 MCG/ACT nasal spray Place 2 sprays into both nostrils daily.  98  . hydrALAZINE (APRESOLINE) 50 MG tablet Take 1 tablet (50 mg total) by mouth 2 (two) times daily. 1 tablet 0  . Insulin Glargine (LANTUS SOLOSTAR) 100 UNIT/ML Solostar Pen Inject 20 Units into the skin daily at 10 pm. 15 mL 11  . insulin lispro (HUMALOG KWIKPEN) 100 UNIT/ML KiwkPen Take 5u before meals for BGL >150 15 mL 11  . LORazepam (ATIVAN) 0.5 MG tablet Take 1 tablet (0.5 mg total) by mouth every 8 (eight) hours. (Patient taking differently: Take 0.5 mg by mouth every 8 (eight) hours as needed for anxiety. ) 30 tablet 4  . NIFEdipine (PROCARDIA XL/ADALAT-CC) 90 MG 24 hr tablet Take 90 mg by mouth every evening.     . nitroGLYCERIN (NITROSTAT) 0.4 MG SL tablet Place 1 tablet (0.4 mg total) under the tongue every 5 (five) minutes as needed for chest pain. 25 tablet 5  . pantoprazole (PROTONIX) 40 MG tablet Take 40 mg by mouth 2 (two) times daily as needed.     . pravastatin (  PRAVACHOL) 40 MG tablet Take 1 tablet (40 mg total) by mouth daily. 90 tablet 3  . torsemide (DEMADEX) 20 MG tablet Take 2 tablets (40 mg total) by mouth daily. New medicine to reduce fluid build up. (Patient taking differently: Take 40-60 mg by mouth daily. **Take 20 mg extra tablet (60mg  total) if weight is over 3-4 pounds.) 60 tablet 3  . glucose blood test strip Test 4 times daily 120 each 5   No current facility-administered medications for this visit.     Past Medical History:  Diagnosis Date  . Acute on chronic diastolic CHF (congestive heart failure) ([place]) 03/05/2015   Grade 2. EF  60-65%  . Anemia    hx of  . Anxiety   . Arthritis   . CAD (coronary artery disease)    s/p Stenting in 2005;  [place] 7/09: Vigorous LV function, 2-3+ MR, RI 40%, RI stent patent, proximal-mid RCA 40-50%;  [place] 7/09:  EF 55-65%, trivial MR;  [place] 11/18/12: EF 66%, normal LV wall motion, mild to moderate anterior ischemia - reviewed by [facility] and felt to rep breast atten (low risk);  [place] 6/14: mild LVH, EF 55-65%, Gr 2 DD, PASP 44, trivial eff   . Chronic kidney disease    CREATININE IS UP--GOING TO KIDNEY MD   . Chronic neck pain   . COPD (chronic obstructive pulmonary disease) ([place])   . Degenerative joint disease    left shoulder  . Diabetic neuropathy ([place])   . Diverticulosis   . Esophageal stricture   . GERD (gastroesophageal reflux disease)   . Hyperlipidemia   . Hypertension   . IDDM (insulin dependent diabetes mellitus) ([place])   . Pericardial effusion 03/06/2015   Very small per echo ; pleural nodules seen on CT chest.  . Peripheral vascular disease ([place])   . Stroke [facility])    "mini- years ago"  . Vitamin D deficiency     Past Surgical History:  Procedure Laterality Date  . ABDOMINAL HYSTERECTOMY    . AV FISTULA PLACEMENT Right 07/20/2015   Procedure: RADIAL CEPHALIC ARTERIOVENOUS FISTULA CREATION RIGHT ARM;  Surgeon: Angelia Mould, MD;  Location: [facility];  Service: Vascular;  Laterality: Right;  . AV FISTULA PLACEMENT Right 11/26/2015   Procedure:  Creation of RIGHT BRACHIO-CEPHALIC arteriovenous fistula;  Surgeon: Angelia Mould, MD;  Location: [facility];  Service: Vascular;  Laterality: Right;  . BACK SURGERY     2007 & 2013  . BLADDER SURGERY     Tack  . CARDIAC CATHETERIZATION  2003, 2005, 2009   1 stent  . cardiac stents    . CATARACT EXTRACTION W/PHACO Right 05/04/2014   Procedure: CATARACT EXTRACTION PHACO AND INTRAOCULAR LENS PLACEMENT (IOC);  Surgeon: Williams Che, MD;  Location: [facility];  Service: Ophthalmology;  Laterality: Right;  CDE 1.47  . CATARACT  EXTRACTION W/PHACO Left 07/20/2014   Procedure: CATARACT EXTRACTION WITH PHACO AND INTRAOCULAR LENS PLACEMENT LEFT EYE CDE=1.79;  Surgeon: Williams Che, MD;  Location: [facility];  Service: Ophthalmology;  Laterality: Left;  . CERVICAL SPINE SURGERY  2012   8 screws  . CYSTOSCOPY WITH INJECTION N/A 06/03/2013   Procedure: MACROPLASTIQUE  INJECTION ;  Surgeon: Malka So, MD;  Location: [facility];  Service: Urology;  Laterality: N/A;  . INSERTION OF DIALYSIS CATHETER N/A 11/26/2015   Procedure: INSERTION OF DIALYSIS CATHETER;  Surgeon: Angelia Mould, MD;  Location: [facility];  Service: Vascular;  Laterality: N/A;  . LAMINECTOMY  12/29/2011  . LUMBAR LAMINECTOMY/DECOMPRESSION MICRODISCECTOMY  12/29/2011   Procedure: LUMBAR LAMINECTOMY/DECOMPRESSION MICRODISCECTOMY;  Surgeon: Floyce Stakes, MD;  Location: [facility];  Service: Neurosurgery;  Laterality: N/A;  Lumbar Two through Lumbar Five Laminectomies/Cellsaver  . PARTIAL HYSTERECTOMY    . PERIPHERAL VASCULAR CATHETERIZATION N/A 11/22/2015   Procedure: Fistulagram;  Surgeon: Angelia Mould, MD;  Location: [facility];  Service: Cardiovascular;  Laterality: N/A;  . SHOULDER SURGERY Right    Rotator cuff    ROS:  Positive for mild insomnia . Otherwise as stated in the HPI and negative for all other systems.  PHYSICAL EXAM BP 138/69   Pulse (!) 52   Ht 5\' 3"  (1.6 m)   Wt 176 lb (79.8 kg)   BMI 31.18 kg/m  GENERAL:  Well appearing NECK:  No jugular venous distention, waveform within normal limits, carotid upstroke brisk and symmetric, no bruits, no thyromegaly LUNGS:  Clear to auscultation bilaterally CHEST:  Unremarkable HEART:  PMI not displaced or sustained,S1 and S2 within normal limits, no S3, no S4, no clicks, no rubs, no murmurs ABD:  Flat, positive bowel sounds normal in frequency in pitch, no bruits, no rebound, no guarding, no midline pulsatile mass, no hepatomegaly, no splenomegaly EXT:  2 plus pulses upper,  decreased dorsalis pedis and posterior tibialis bilaterally, mild bilateral lower extremity edema, no cyanosis no clubbing, right upper arm thrill and bruit SKIN:  No rashes no nodules, keloids  EKG:  Sinus rhythm, rate 63, preexcitation, premature ventricular contraction, right axis deviation, poor anterior R wave progression.  ASSESSMENT AND PLAN   CAD -  She had a negative submaximal stress test in Feb. I don't strongly suspect obstructive coronary disease following this test. She does have some atypical symptoms.  No further imaging is planned. She wants to stop the Imdur and I think this is reasonable. She'll let me know if she has any pain area  HYPERTENSION, BENIGN -  The blood pressure is at target. No change in medications is indicated. We will continue with therapeutic lifestyle changes (TLC).  DYSLIPIDEMIA -   I reviewed some recent labs in her LDL was 70 with an HDL of 40 or. She'll continue the meds as listed.  CHRONIC DIASTOLIC - Her volume is now being managed with dialysis.  ABNORMAL CT - Groundglass opacities recently identified resolved on the January CT. There was coronary calcium.  CKD - As above

## 2016-08-23 ENCOUNTER — Encounter: Payer: Self-pay | Admitting: Cardiology

## 2016-08-23 ENCOUNTER — Ambulatory Visit (INDEPENDENT_AMBULATORY_CARE_PROVIDER_SITE_OTHER): Payer: Medicare Other | Admitting: Cardiology

## 2016-08-23 VITALS — BP 138/69 | HR 52 | Ht 63.0 in | Wt 176.0 lb

## 2016-08-23 DIAGNOSIS — I10 Essential (primary) hypertension: Secondary | ICD-10-CM

## 2016-08-23 DIAGNOSIS — I70213 Atherosclerosis of native arteries of extremities with intermittent claudication, bilateral legs: Secondary | ICD-10-CM | POA: Diagnosis not present

## 2016-08-23 NOTE — Patient Instructions (Signed)
Medication Instructions:  Please stop your Imdur. Continue all other medications as listed.  Follow-Up: Follow up in 1 year with Dr. Percival Spanish.  You will receive a letter in the mail 2 months before you are due.  Please call us when you receive this letter to schedule your follow up appointment.  If you need a refill on your cardiac medications before your next appointment, please call your pharmacy.  Thank you for choosing [facility]!!

## 2016-09-08 ENCOUNTER — Other Ambulatory Visit: Payer: Self-pay | Admitting: Pediatrics

## 2016-09-13 ENCOUNTER — Ambulatory Visit (INDEPENDENT_AMBULATORY_CARE_PROVIDER_SITE_OTHER): Payer: Medicare Other | Admitting: Pediatrics

## 2016-09-13 ENCOUNTER — Encounter: Payer: Self-pay | Admitting: Pediatrics

## 2016-09-13 VITALS — BP 139/62 | HR 52 | Temp 97.4°F | Ht 63.0 in | Wt 173.0 lb

## 2016-09-13 DIAGNOSIS — J449 Chronic obstructive pulmonary disease, unspecified: Secondary | ICD-10-CM

## 2016-09-13 DIAGNOSIS — E1121 Type 2 diabetes mellitus with diabetic nephropathy: Secondary | ICD-10-CM | POA: Diagnosis not present

## 2016-09-13 DIAGNOSIS — I739 Peripheral vascular disease, unspecified: Secondary | ICD-10-CM

## 2016-09-13 DIAGNOSIS — Z992 Dependence on renal dialysis: Secondary | ICD-10-CM | POA: Diagnosis not present

## 2016-09-13 DIAGNOSIS — I10 Essential (primary) hypertension: Secondary | ICD-10-CM | POA: Diagnosis not present

## 2016-09-13 DIAGNOSIS — F411 Generalized anxiety disorder: Secondary | ICD-10-CM | POA: Diagnosis not present

## 2016-09-13 DIAGNOSIS — I5032 Chronic diastolic (congestive) heart failure: Secondary | ICD-10-CM

## 2016-09-13 DIAGNOSIS — K219 Gastro-esophageal reflux disease without esophagitis: Secondary | ICD-10-CM

## 2016-09-13 DIAGNOSIS — N186 End stage renal disease: Secondary | ICD-10-CM

## 2016-09-13 DIAGNOSIS — E785 Hyperlipidemia, unspecified: Secondary | ICD-10-CM | POA: Diagnosis not present

## 2016-09-13 MED ORDER — PRAVASTATIN SODIUM 20 MG PO TABS: 20.0000 mg | ORAL_TABLET | Freq: Every day | ORAL | 1 refills | Status: DC

## 2016-09-13 MED ORDER — PANTOPRAZOLE SODIUM 40 MG PO TBEC: 40.0000 mg | DELAYED_RELEASE_TABLET | Freq: Every day | ORAL | 0 refills | Status: DC | PRN

## 2016-09-13 MED ORDER — INSULIN GLARGINE 100 UNIT/ML SOLOSTAR PEN: 10.0000 [IU] | PEN_INJECTOR | Freq: Every day | SUBCUTANEOUS | 11 refills | Status: DC

## 2016-09-13 NOTE — Patient Instructions (Signed)
Decrease to 10 units of lantus at night

## 2016-09-13 NOTE — Progress Notes (Signed)
  Subjective:   Patient ID: [name], female    DOB: 09-13-1951, 65 y.o.   MRN: [id number] CC: Follow-up multiple med problems  HPI: [name] is a 65 y.o. female presenting for Follow-up  HLD: Taking pravastatin most days Some days having cramping  DM2: not taking fast acting insulin at all BGLs in AM 80s-100 No recent lows  ESRD: still urinating On dialysis  HTN:  Takes meds regularly though skips morning of diaylsis  PAD Leg cramps improved with not taking BP meds before dialysis  GAD: Takes ativan when she has a very bad day with anxiety, apprx once a week Takes lexapro every day  Relevant past medical, surgical, family and social history reviewed. Allergies and medications reviewed and updated. History  Smoking Status  . Former Smoker  . Packs/day: 1.00  . Years: 30.00  . Quit date: 12/04/2001  Smokeless Tobacco  . Never Used   ROS: Per HPI   Objective:    BP 139/62   Pulse (!) 52   Temp 97.4 F (36.3 C) (Oral)   Ht 5\' 3"  (1.6 m)   Wt 173 lb (78.5 kg)   BMI 30.65 kg/m   Wt Readings from Last 3 Encounters:  09/13/16 173 lb (78.5 kg)  08/23/16 176 lb (79.8 kg)  08/10/16 176 lb (79.8 kg)    Gen: NAD, alert, cooperative with exam, NCAT EYES: EOMI, no conjunctival injection, or no icterus CV: NRRR, normal S1/S2, no murmur, distal pulses diminished b/l Resp: CTABL, no wheezes, normal WOB Abd: +BS, soft, NTND.  Ext: No edema, warm Neuro: Alert and oriented, strength equal b/l UE and LE, coordination grossly normal MSK: normal muscle bulk  Assessment & Plan:  [name] was seen today for follow-up multiple med problems  Diagnoses and all orders for this visit:  Chronic obstructive pulmonary disease, unspecified COPD type ([place]) Well controlled symptoms, cont meds  Type II diabetes mellitus with nephropathy ([place]) Alc this month at dialysis was 6.2 Foot exam normal today Needs eye exam, pt to call -     Insulin Glargine (LANTUS SOLOSTAR)  100 UNIT/ML Solostar Pen; Inject 10 Units into the skin daily at 10 pm.  Hyperlipidemia, unspecified hyperlipidemia type Decrease to 20mg , has leg cramps with 40mg  -     pravastatin (PRAVACHOL) 20 MG tablet; Take 1 tablet (20 mg total) by mouth daily.  Chronic kidney disease, on dialysis (severe) ([place]) Follows with nephrology  GAD (generalized anxiety disorder) On lexapro, rarely, apprx 1x a month needs ativan  HYPERTENSION, BENIGN Well controlled, cont meds  Chronic diastolic CHF (congestive heart failure) ([facility]) euvolemic  PAD (peripheral artery disease) ([place]) Start walking more regularly Seen by vascular, told 75% one leg, 85% other leg On ASA 81mg  Leg cramps improving during dialysis  Gastroesophageal reflux disease, esophagitis presence not specified -     pantoprazole (PROTONIX) 40 MG tablet; Take 1 tablet (40 mg total) by mouth daily as needed.  Received flu shot at dialysis Follow up plan: Return in about 3 months (around 12/14/2016) for chronic f/u. [name], MD [place]

## 2016-09-19 ENCOUNTER — Other Ambulatory Visit (HOSPITAL_COMMUNITY): Payer: Self-pay | Admitting: Pulmonary Disease

## 2016-09-19 DIAGNOSIS — R1310 Dysphagia, unspecified: Secondary | ICD-10-CM

## 2016-09-27 ENCOUNTER — Telehealth (HOSPITAL_COMMUNITY): Payer: Self-pay | Admitting: Speech Pathology

## 2016-09-27 NOTE — Telephone Encounter (Signed)
Pt cx'd first apptment and rescheduled for 10-16-16. Called C-Scheduling([facility] [place]) and rescheduled as patient requested. NF 09/27/16 [employer] MCR HMO 12-05-15 - current No deduct Copay 40.00per visit OOP [id number] Pre-Cert is required after Eval -paperwork attached to patient packet. Fax to: [employer] [phone] Spoke to [facility] VTW#[id number] Spoke to Ref# [id number] NF 09/19/16

## 2016-09-28 ENCOUNTER — Ambulatory Visit (HOSPITAL_COMMUNITY): Payer: Medicare Other

## 2016-09-28 ENCOUNTER — Ambulatory Visit (HOSPITAL_COMMUNITY): Payer: Medicare Other | Admitting: Speech Pathology

## 2016-10-06 ENCOUNTER — Ambulatory Visit (INDEPENDENT_AMBULATORY_CARE_PROVIDER_SITE_OTHER): Payer: Medicare Other | Admitting: *Deleted

## 2016-10-06 VITALS — BP 162/73 | HR 59 | Ht 63.75 in | Wt 171.0 lb

## 2016-10-06 DIAGNOSIS — Z0000 Encounter for general adult medical examination without abnormal findings: Secondary | ICD-10-CM | POA: Diagnosis not present

## 2016-10-06 DIAGNOSIS — Z1231 Encounter for screening mammogram for malignant neoplasm of breast: Secondary | ICD-10-CM

## 2016-10-06 NOTE — Patient Instructions (Addendum)
  [name] ,  Thank you for taking time to come for your Medicare Wellness Visit. I appreciate your ongoing commitment to your health goals. Please review the following plan we discussed and let me know if I can assist you in the future.   These are the goals we discussed: Goals    . Exercise 3x per week (30 min per time)          Walk for 30 minutes 3 times per week       This is a list of the screening recommended for you and due dates:  Health Maintenance  Topic Date Due  . Eye exam for diabetics  05/12/2016  . Mammogram  11/11/2016  . Hemoglobin A1C  12/09/2016  . Complete foot exam   09/13/2017  . Tetanus Vaccine  08/04/2021  . Colon Cancer Screening  11/03/2024  . Flu Shot  Completed  . Shingles Vaccine  Addressed  .  Hepatitis C: One time screening is recommended by Center for Disease Control  (CDC) for  adults born from [birth year] through 1964.   Completed  . HIV Screening  Completed   Mammogram ordered. You will hear from our referral department about this appointment.   I will call Dr Payton [place] office to schedule your eye exam.

## 2016-10-09 NOTE — Progress Notes (Signed)
Subjective:   [name] is a 65 y.o. female who presents for an Initial Medicare Annual Wellness Visit. [name] lives alone but close to her mother. She has 2 daughters. She enjoys computer games but doesn't have a lot of physical activities. She has chronic kidney disease and undergoes dialysis 3 days per week. She has a very positive attitude about this.   Review of Systems    Patient states that her health is about the same as last year.   Cardiac Risk Factors include: advanced age (>23men, >6 women);diabetes mellitus;dyslipidemia;microalbuminuria;sedentary lifestyle   Other systems negative.     Objective:    BP (!) 162/73 (BP Location: Left Arm, Patient Position: Sitting, Cuff Size: Normal)   Pulse (!) 59   Ht 5' 3.75" (1.619 m)   Wt 171 lb (77.6 kg)   BMI 29.58 kg/m     Current Medications (verified) Outpatient Encounter Prescriptions as of 10/06/2016  Medication Sig  . albuterol (PROVENTIL HFA;VENTOLIN HFA) 108 (90 BASE) MCG/ACT inhaler Inhale 2 puffs into the lungs every 6 (six) hours as needed for wheezing or shortness of breath.  [name] albuterol (PROVENTIL) (2.5 MG/3ML) 0.083% nebulizer solution Take 3 mLs (2.5 mg total) by nebulization every 6 (six) hours as needed for wheezing or shortness of breath.  [name] aspirin 81 MG EC tablet Take 81 mg by mouth daily.    [name] atenolol (TENORMIN) 50 MG tablet TAKE (1) TABLET TWICE DAILY.  . budesonide-formoterol (SYMBICORT) 160-4.5 MCG/ACT inhaler Inhale 2 puffs into the lungs 2 (two) times daily.  . Carboxymethylcellulose Sodium (THERATEARS OP) Place 2 drops into both eyes as needed (for dry eyes). Reported on 12/09/2015  . escitalopram (LEXAPRO) 10 MG tablet Take 1 tablet (10 mg total) by mouth daily.  . fluticasone (FLONASE) 50 MCG/ACT nasal spray Place 2 sprays into both nostrils daily.  [name] glucose blood test strip Test 4 times daily  . hydrALAZINE (APRESOLINE) 50 MG tablet Take 1 tablet (50 mg total) by mouth 2 (two) times daily.    . Insulin Glargine (LANTUS SOLOSTAR) 100 UNIT/ML Solostar Pen Inject 10 Units into the skin daily at 10 pm.  . LORazepam (ATIVAN) 0.5 MG tablet Take 1 tablet (0.5 mg total) by mouth every 8 (eight) hours. (Patient taking differently: Take 0.5 mg by mouth every 8 (eight) hours as needed for anxiety. )  . NIFEdipine (PROCARDIA XL/ADALAT-CC) 90 MG 24 hr tablet Take 90 mg by mouth every evening.   . nitroGLYCERIN (NITROSTAT) 0.4 MG SL tablet Place 1 tablet (0.4 mg total) under the tongue every 5 (five) minutes as needed for chest pain.  . pantoprazole (PROTONIX) 40 MG tablet Take 1 tablet (40 mg total) by mouth daily as needed.  . pravastatin (PRAVACHOL) 20 MG tablet Take 1 tablet (20 mg total) by mouth daily.  [name] torsemide (DEMADEX) 20 MG tablet Take 2 tablets (40 mg total) by mouth daily. New medicine to reduce fluid build up. (Patient taking differently: Take 40-60 mg by mouth daily. **Take 20 mg extra tablet (60mg  total) if weight is over 3-4 pounds.)   No facility-administered encounter medications on file as of 10/06/2016.     Allergies (verified) Ace inhibitors; Clopidogrel bisulfate; Codeine; Lisinopril; and Penicillins   History: Past Medical History:  Diagnosis Date  . Acute on chronic diastolic CHF (congestive heart failure) ([place]) 03/05/2015   Grade 2. EF 60-65%  . Anemia    hx of  . Anxiety   . Arthritis   . CAD (coronary  artery disease)    s/p Stenting in 2005;  [place] 7/09: Vigorous LV function, 2-3+ MR, RI 40%, RI stent patent, proximal-mid RCA 40-50%;  Echo 7/09:  EF 55-65%, trivial MR;  Myoview 11/18/12: EF 66%, normal LV wall motion, mild to moderate anterior ischemia - reviewed by [facility] and felt to rep breast atten (low risk);  Echo 6/14: mild LVH, EF 55-65%, Gr 2 DD, PASP 44, trivial eff   . Chronic kidney disease    CREATININE IS UP--GOING TO KIDNEY MD   . Chronic neck pain   . COPD (chronic obstructive pulmonary disease) ([place])   . Degenerative joint disease    left shoulder   . Diabetic neuropathy ([place])   . Diverticulosis   . Esophageal stricture   . GERD (gastroesophageal reflux disease)   . Hyperlipidemia   . Hypertension   . IDDM (insulin dependent diabetes mellitus) ([place])   . Pericardial effusion 03/06/2015   Very small per echo ; pleural nodules seen on CT chest.  . Peripheral vascular disease ([place])   . Stroke [facility])    "mini- years ago"  . Vitamin D deficiency    Past Surgical History:  Procedure Laterality Date  . ABDOMINAL HYSTERECTOMY    . AV FISTULA PLACEMENT Right 07/20/2015   Procedure: RADIAL CEPHALIC ARTERIOVENOUS FISTULA CREATION RIGHT ARM;  Surgeon: Angelia Mould, MD;  Location: [place];  Service: Vascular;  Laterality: Right;  . AV FISTULA PLACEMENT Right 11/26/2015   Procedure:  Creation of RIGHT BRACHIO-CEPHALIC arteriovenous fistula;  Surgeon: Angelia Mould, MD;  Location: [place];  Service: Vascular;  Laterality: Right;  . BACK SURGERY     2007 & 2013  . BLADDER SURGERY     Tack  . CARDIAC CATHETERIZATION  2003, 2005, 2009   1 stent  . cardiac stents    . CATARACT EXTRACTION W/PHACO Right 05/04/2014   Procedure: CATARACT EXTRACTION PHACO AND INTRAOCULAR LENS PLACEMENT (IOC);  Surgeon: Williams Che, MD;  [facility];  Service: Ophthalmology;  Laterality: Right;  CDE 1.47  . CATARACT EXTRACTION W/PHACO Left 07/20/2014   Procedure: CATARACT EXTRACTION WITH PHACO AND INTRAOCULAR LENS PLACEMENT LEFT EYE CDE=1.79;  Surgeon: Williams Che, MD;  [facility];  Service: Ophthalmology;  Laterality: Left;  . CERVICAL SPINE SURGERY  2012   8 screws  . CYSTOSCOPY WITH INJECTION N/A 06/03/2013   Procedure: MACROPLASTIQUE  INJECTION ;  Surgeon: Malka So, MD;  [facility];  Service: Urology;  Laterality: N/A;  . INSERTION OF DIALYSIS CATHETER N/A 11/26/2015   Procedure: INSERTION OF DIALYSIS CATHETER;  Surgeon: Angelia Mould, MD;  Location: [place];  Service: Vascular;  Laterality: N/A;  . LAMINECTOMY   12/29/2011  . LUMBAR LAMINECTOMY/DECOMPRESSION MICRODISCECTOMY  12/29/2011   Procedure: LUMBAR LAMINECTOMY/DECOMPRESSION MICRODISCECTOMY;  Surgeon: Floyce Stakes, MD;  Location: [place] [facility];  Service: Neurosurgery;  Laterality: N/A;  Lumbar Two through Lumbar Five Laminectomies/Cellsaver  . PARTIAL HYSTERECTOMY    . PERIPHERAL VASCULAR CATHETERIZATION N/A 11/22/2015   Procedure: Fistulagram;  Surgeon: Angelia Mould, MD;  Location: [place] [facility];  Service: Cardiovascular;  Laterality: N/A;  . SHOULDER SURGERY Right    Rotator cuff   Family History  Problem Relation Age of Onset  . Heart disease Other   . Hypertension Father   . Cancer Father   . Hypertension Daughter   . Pancreatitis Brother   . Healthy Daughter    Social History   Occupational History  . Not  on file.   Social History Main Topics  . Smoking status: Former Smoker    Packs/day: 1.00    Years: 30.00    Quit date: 12/04/2001  . Smokeless tobacco: Never Used  . Alcohol use No  . Drug use: No  . Sexual activity: No    Tobacco Counseling No tobacco use  Activities of Daily Living In your present state of health, do you have any difficulty performing the following activities: 10/06/2016 11/22/2015  Hearing? N N  Vision? N N  Difficulty concentrating or making decisions? N N  Walking or climbing stairs? N Y  Dressing or bathing? N N  Doing errands, shopping? N -  Preparing Food and eating ? N -  Using the Toilet? N -  In the past six months, have you accidently leaked urine? Y -  Do you have problems with loss of bowel control? N -  Managing your Medications? N -  Managing your Finances? N -  Housekeeping or managing your Housekeeping? N -  Some recent data might be hidden    Immunizations and Health Maintenance Immunization History  Administered Date(s) Administered  . DTaP 07/19/2010  . Influenza Whole 09/03/2006  . Influenza,inj,Quad PF,36+ Mos 09/11/2013, 09/09/2014  .  Influenza-Unspecified 09/14/2015  . PPD Test 11/23/2015  . Pneumococcal Polysaccharide-23 08/26/2012   Health Maintenance Due  Topic Date Due  . OPHTHALMOLOGY EXAM  05/12/2016    Patient Care Team: Eustaquio Maize, MD as PCP - General (Pediatrics) Fran Lowes, MD as Consulting Physician (Nephrology) [name], MD as Referring Physician (Family Medicine) Minus Breeding, MD as Consulting Physician (Cardiology) [facility]   Assessment:   This is a routine wellness examination for [name].   Hearing/Vision screen No hearing or vision deficits noted  Dietary issues and exercise activities discussed: Current Exercise Habits: The patient does not participate in regular exercise at present, Exercise limited by: None identified  Goals    . Exercise 3x per week (30 min per time)          Walk for 30 minutes 3 times per week      Patient reports eating 2 meals a day with some snacks. She has to limited her fluid intake and has a limited diet due to dialysis. She feels that she is doing well with this.   Depression Screen PHQ 2/9 Scores 10/06/2016 09/13/2016 08/10/2016 08/08/2016 06/09/2016 03/03/2016 01/07/2016  PHQ - 2 Score 0 0 0 0 0 0 0    Fall Risk Fall Risk  10/06/2016 09/13/2016 08/10/2016 08/08/2016 06/09/2016  Falls in the past year? No No No No No    Cognitive Function:    MMSE - Mini Mental State Exam 10/06/2016  Orientation to time 5  Orientation to Place 5  Registration 3  Attention/ Calculation 5  Recall 3  Language- name 2 objects 2  Language- repeat 1  Language- follow 3 step command 3  Language- read & follow direction 1  Write a sentence 1  Copy design 1  Total score 30         Screening Tests Health Maintenance  Topic Date Due  . OPHTHALMOLOGY EXAM  05/12/2016  . MAMMOGRAM  11/11/2016  . HEMOGLOBIN A1C  12/09/2016  . FOOT EXAM  09/13/2017  . TETANUS/TDAP  08/04/2021  . COLONOSCOPY  11/03/2024  . INFLUENZA VACCINE   Completed  . ZOSTAVAX  Addressed  . Hepatitis C Screening  Completed  . HIV Screening  Completed  Plan:  Keep[ follow up appointment with Dr Evette Doffing on 12/18/16  During the course of the visit, [name] was educated and counseled about the following appropriate screening and preventive services:    Vaccines to include Pneumoccal-up to date, Influenza-08/25/16, Tdap-postponed, Zostavax-postponed  Electrocardiogram- done 08/23/16  Cardiovascular disease screening-with routine labs  Colorectal cancer screening-Done 11/03/14 requested from [facility]  Glaucoma screening-Contacted [facility] for appt and they will reach out to patient  Mammography-due after 11/11/16. Referral placed.  Nutrition counseling   Patient Instructions (the written plan) were given to the patient.    Chong Sicilian, RN   10/09/2016    I have reviewed and agree with the above AWV documentation.   [name], MD [facility] 10/09/2016, 2:00 PM

## 2016-10-16 ENCOUNTER — Encounter (HOSPITAL_COMMUNITY): Payer: Self-pay | Admitting: Speech Pathology

## 2016-10-16 ENCOUNTER — Other Ambulatory Visit (HOSPITAL_COMMUNITY): Payer: Self-pay | Admitting: Pulmonary Disease

## 2016-10-16 ENCOUNTER — Ambulatory Visit (HOSPITAL_COMMUNITY)
Admission: RE | Admit: 2016-10-16 | Discharge: 2016-10-16 | Disposition: A | Discharge status: Home / Self Care | Payer: Medicare Other | Source: Ambulatory Visit | Attending: Pulmonary Disease | Admitting: Pulmonary Disease

## 2016-10-16 ENCOUNTER — Ambulatory Visit (HOSPITAL_COMMUNITY): Payer: Medicare Other | Attending: Pulmonary Disease | Admitting: Speech Pathology

## 2016-10-16 DIAGNOSIS — R1310 Dysphagia, unspecified: Secondary | ICD-10-CM

## 2016-10-16 DIAGNOSIS — R1312 Dysphagia, oropharyngeal phase: Secondary | ICD-10-CM

## 2016-10-16 NOTE — Therapy (Signed)
[place], [place], [address] Phone: [phone]   Fax:  [phone]  Modified Barium Swallow  Patient Details  Name: [name] MRN: [id number] Date of Birth: [birth year] No Data Recorded  Encounter Date: 10/16/2016    Past Medical History:  Diagnosis Date  . Acute on chronic diastolic CHF (congestive heart failure) ([place]) 03/05/2015   Grade 2. EF 60-65%  . Anemia    hx of  . Anxiety   . Arthritis   . CAD (coronary artery disease)    s/p Stenting in 2005;  [place] 7/09: Vigorous LV function, 2-3+ MR, RI 40%, RI stent patent, proximal-mid RCA 40-50%;  E[place] 7/09:  EF 55-65%, trivial MR;  Myoview 11/18/12: EF 66%, normal LV wall motion, mild to moderate anterior ischemia - reviewed by [facility] and felt to rep breast atten (low risk);  Echo 6/14: mild LVH, EF 55-65%, Gr 2 DD, PASP 44, trivial eff   . Chronic kidney disease    CREATININE IS UP--GOING TO KIDNEY MD   . Chronic neck pain   . COPD (chronic obstructive pulmonary disease) ([place])   . Degenerative joint disease    left shoulder  . Diabetic neuropathy ([place])   . Diverticulosis   . Esophageal stricture   . GERD (gastroesophageal reflux disease)   . Hyperlipidemia   . Hypertension   . IDDM (insulin dependent diabetes mellitus) ([place])   . Pericardial effusion 03/06/2015   Very small per echo ; pleural nodules seen on CT chest.  . Peripheral vascular disease ([place])   . Stroke [facility])    "mini- years ago"  . Vitamin D deficiency     Past Surgical History:  Procedure Laterality Date  . ABDOMINAL HYSTERECTOMY    . AV FISTULA PLACEMENT Right 07/20/2015   Procedure: RADIAL CEPHALIC ARTERIOVENOUS FISTULA CREATION RIGHT ARM;  Surgeon: Angelia Mould, MD;  Location: [place];  Service: Vascular;  Laterality: Right;  . AV FISTULA PLACEMENT Right 11/26/2015   Procedure:  Creation of RIGHT BRACHIO-CEPHALIC arteriovenous fistula;  Surgeon: Angelia Mould, MD;   Location: [place];  Service: Vascular;  Laterality: Right;  . BACK SURGERY     2007 & 2013  . BLADDER SURGERY     Tack  . CARDIAC CATHETERIZATION  2003, 2005, 2009   1 stent  . cardiac stents    . CATARACT EXTRACTION W/PHACO Right 05/04/2014   Procedure: CATARACT EXTRACTION PHACO AND INTRAOCULAR LENS PLACEMENT (IOC);  Surgeon: Williams Che, MD;  [facility];  Service: Ophthalmology;  Laterality: Right;  CDE 1.47  . CATARACT EXTRACTION W/PHACO Left 07/20/2014   Procedure: CATARACT EXTRACTION WITH PHACO AND INTRAOCULAR LENS PLACEMENT LEFT EYE CDE=1.79;  Surgeon: Williams Che, MD;  [facility];  Service: Ophthalmology;  Laterality: Left;  . CERVICAL SPINE SURGERY  2012   8 screws  . CYSTOSCOPY WITH INJECTION N/A 06/03/2013   Procedure: MACROPLASTIQUE  INJECTION ;  Surgeon: Malka So, MD;  [facility];  Service: Urology;  Laterality: N/A;  . INSERTION OF DIALYSIS CATHETER N/A 11/26/2015   Procedure: INSERTION OF DIALYSIS CATHETER;  Surgeon: Angelia Mould, MD;  Location: [place];  Service: Vascular;  Laterality: N/A;  . LAMINECTOMY  12/29/2011  . LUMBAR LAMINECTOMY/DECOMPRESSION MICRODISCECTOMY  12/29/2011   Procedure: LUMBAR LAMINECTOMY/DECOMPRESSION MICRODISCECTOMY;  Surgeon: Floyce Stakes, MD;  Location: [place] [facility];  Service: Neurosurgery;  Laterality: N/A;  Lumbar Two through Lumbar Five Laminectomies/Cellsaver  . PARTIAL HYSTERECTOMY    .  PERIPHERAL VASCULAR CATHETERIZATION N/A 11/22/2015   Procedure: Fistulagram;  Surgeon: Angelia Mould, MD;  Location: [place] [facility];  Service: Cardiovascular;  Laterality: N/A;  . SHOULDER SURGERY Right    Rotator cuff    There were no vitals filed for this visit.      Subjective Assessment - 10/16/16 1515    Subjective "Sometimes I just can't get it to go down." (food)   Special Tests MBSS   Currently in Pain? No/denies             General - 10/16/16 1516      General Information   Date of Onset  10/04/16   HPI Ms. [name] is a 65 yo woman who was referred by Dr. Luan Pulling for MBSS due to pt reports of difficulty initiating a swallow at times and coughing on her saliva. She has a past medical history significant for CAD, COPD, asthma, esophageal stricture, GERD, HTN, stroke, cervical spine surgery, and dialysis. Pt states that she has been seen by Dr. Benjamine Mola (about a year ago) and is followed by Dr. Luan Pulling for pulmonary needs. She tells me that she has a "hard time getting food to go down" at times.   Type of Study MBS-Modified Barium Swallow Study   Previous Swallow Assessment None on record   Diet Prior to this Study Regular;Thin liquids   Temperature Spikes Noted No   Respiratory Status Room air   History of Recent Intubation No   Behavior/Cognition Alert;Cooperative;Pleasant mood   Oral Cavity Assessment Within Functional Limits   Oral Care Completed by SLP No   Oral Cavity - Dentition Dentures, top;Adequate natural dentition   Vision Functional for self feeding   Self-Feeding Abilities Able to feed self   Patient Positioning Upright in chair   Baseline Vocal Quality Normal   Volitional Cough Strong   Volitional Swallow Able to elicit   Anatomy Within functional limits   Pharyngeal Secretions Not observed secondary MBS          Adult Oral Care Protocol - 10/16/16 1517      Oral Assessment (Complete on admission/transfer/change in patient condition)   Does patient have any of the following "high risk" factors? None of the above   Does patient have any of the following "at risk" factors? None of the above          Oral Preparation/Oral Phase - 10/16/16 1518      Oral Preparation/Oral Phase   Oral Phase Impaired     Oral - Thin   Oral - Thin Cup Within functional limits   Oral - Thin Straw Within functional limits     Oral - Solids   Oral - Puree Delayed A-P transit   Oral - Regular Delayed A-P transit   Oral - Pill Delayed A-P transit     Electrical  stimulation - Oral Phase   Was Electrical Stimulation Used No          Pharyngeal Phase - 10/16/16 1519      Pharyngeal Phase   Pharyngeal Phase Within functional limits     Electrical Stimulation - Pharyngeal Phase   Was Electrical Stimulation Used No          Cricopharyngeal Phase - 10/16/16 1519      Cervical Esophageal Phase   Cervical Esophageal Phase Within functional limits           Plan - 10/16/16 1520    Clinical Impression Statement Pt presents with mild oral phase  dysphagia and WNL pharyngeal phase characterized by viariably prolonged oral prep in absence of lingual weakness. Pt describes swallows as "effortful" to get started and she has "to concentrate". The delay appeared only mild, but subjectively bothered the patient. Oral motor examination was unremarkable except for a few white spots on tip of uvulae and enlarged tonsils. Pt states t hat she was seen by Dr. Benjamine Mola about a year ago, however I do not have access to those records. She reports that  he had a difficullt time passing the endoscope through nose due to significant phlegm. Pt also reports that she has noticed that he uvulae has been swollen in the past, which caused discomfort. It does not appear to be swollen today. The MBSS was reviewed with pt and she was able to watch the recording of the study. Pt does not exhibit any weakness and as such swallowing exercises are not indicated. SLP inquired as to whether pt notes increased delays as meals progress and she did not. Pt uses inhaler as needed. Pt may benefit from oral rinse following its use; unsure if small white specs along uvulae are related. She may benefit from follow up with ENT to assess for any changes (tonsils?).   Consulted and Agree with Plan of Care Patient      Patient will benefit from skilled therapeutic intervention in order to improve the following deficits and impairments:   Dysphagia, oropharyngeal phase      G-Codes - 11/07/2016  1533    Functional Assessment Tool Used MBSS; clinical judgment   Functional Limitations Swallowing   Swallow Current Status (V3710) At least 1 percent but less than 20 percent impaired, limited or restricted   Swallow Goal Status (G2694) At least 1 percent but less than 20 percent impaired, limited or restricted   Swallow Discharge Status [phone]) At least 1 percent but less than 20 percent impaired, limited or restricted          Recommendations/Treatment - November 07, 2016 1519      Swallow Evaluation Recommendations   Recommended Consults Consider ENT evaluation   SLP Diet Recommendations Age appropriate regular;Thin   Liquid Administration via Straw;Cup   Medication Administration Whole meds with liquid   Supervision Patient able to self feed   Postural Changes Seated upright at 90 degrees          Prognosis - November 07, 2016 1520      Prognosis   Prognosis for Safe Diet Advancement Good     Individuals Consulted   Consulted and Agree with Results and Recommendations Patient   Report Sent to  Referring physician      Problem List Patient Active Problem List   Diagnosis Date Noted  . GAD (generalized anxiety disorder) 03/04/2016  . Leg pain 01/07/2016  . Chest pressure 01/07/2016  . Constipation 10/25/2015  . Pleural nodules 03/08/2015  . COPD (chronic obstructive pulmonary disease) ([place]) 03/06/2015  . Chronic kidney disease, stage IV (severe) ([place]) 03/06/2015  . Morbid obesity ([place]) 03/06/2015  . Pericardial effusion 03/06/2015  . Chronic diastolic CHF (congestive heart failure) ([place]) 03/05/2015  . PAD (peripheral artery disease) ([place]) 04/21/2014  . Leukocytosis 02/02/2014  . Dyspnea 09/29/2013  . Abnormal chest x-ray 09/29/2013  . Diverticulosis 03/29/2011  . Diabetic neuropathy ([place]) 03/29/2011  . Esophageal stricture 03/29/2011  . Vitamin D deficiency 03/29/2011  . Degenerative joint disease of left shoulder 03/29/2011  . Neck pain 03/29/2011  . Type II diabetes  mellitus with nephropathy ([place]) 04/19/2009  . Hyperlipidemia 04/19/2009  .  HYPERTENSION, BENIGN 04/19/2009  . Coronary atherosclerosis 04/19/2009    [name] 10/16/2016, 3:34 PM  [place], [place], [address] Phone: [phone]   Fax:  [phone]  Name: [name] MRN: [id number] Date of Birth: 1951/09/28

## 2016-10-18 NOTE — H&P (Addendum)
SURGICAL HISTORY & PHYSICAL  HISTORY OF PRESENT ILLNESS (HPI):  65 y.o. Right-handed female on Tu/Th/Sa hemodialysis x ~1 year for ESRD presented with prolonged bleeding from her Right brachial-cephalic AV fistula following access for hemodialysis recently. Patient denies any difficulty with HD RN's accessing her fistula and denies pain, fever/chills, CP, or SOB.   PAST MEDICAL HISTORY (PMH):  Past Medical History:  Diagnosis Date  . Acute on chronic diastolic CHF (congestive heart failure) ([place]) 03/05/2015   Grade 2. EF 60-65%  . Anemia    hx of  . Anxiety   . Arthritis   . CAD (coronary artery disease)    s/p Stenting in 2005;  [place] 7/09: Vigorous LV function, 2-3+ MR, RI 40%, RI stent patent, proximal-mid RCA 40-50%;  Echo 7/09:  EF 55-65%, trivial MR;  Myoview 11/18/12: EF 66%, normal LV wall motion, mild to moderate anterior ischemia - reviewed by [facility] and felt to rep breast atten (low risk);  Echo 6/14: mild LVH, EF 55-65%, Gr 2 DD, PASP 44, trivial eff   . Chronic kidney disease    CREATININE IS UP--GOING TO KIDNEY MD   . Chronic neck pain   . COPD (chronic obstructive pulmonary disease) ([place])   . Degenerative joint disease    left shoulder  . Diabetic neuropathy ([place])   . Diverticulosis   . Esophageal stricture   . GERD (gastroesophageal reflux disease)   . Hyperlipidemia   . Hypertension   . IDDM (insulin dependent diabetes mellitus) ([place])   . Pericardial effusion 03/06/2015   Very small per echo ; pleural nodules seen on CT chest.  . Peripheral vascular disease ([place])   . Stroke [facility])    "mini- years ago"  . Vitamin D deficiency     Reviewed. Otherwise negative.   PAST SURGICAL HISTORY ([place]):  Past Surgical History:  Procedure Laterality Date  . ABDOMINAL HYSTERECTOMY    . AV FISTULA PLACEMENT Right 07/20/2015   Procedure: RADIAL CEPHALIC ARTERIOVENOUS FISTULA CREATION RIGHT ARM;  Surgeon: Angelia Mould, MD;  Location: [place];  Service: Vascular;  Laterality:  Right;  . AV FISTULA PLACEMENT Right 11/26/2015   Procedure:  Creation of RIGHT BRACHIO-CEPHALIC arteriovenous fistula;  Surgeon: Angelia Mould, MD;  Location: [place];  Service: Vascular;  Laterality: Right;  . BACK SURGERY     2007 & 2013  . BLADDER SURGERY     Tack  . CARDIAC CATHETERIZATION  2003, 2005, 2009   1 stent  . cardiac stents    . CATARACT EXTRACTION W/PHACO Right 05/04/2014   Procedure: CATARACT EXTRACTION PHACO AND INTRAOCULAR LENS PLACEMENT (IOC);  Surgeon: Williams Che, MD;  Location: [facility];  Service: Ophthalmology;  Laterality: Right;  CDE 1.47  . CATARACT EXTRACTION W/PHACO Left 07/20/2014   Procedure: CATARACT EXTRACTION WITH PHACO AND INTRAOCULAR LENS PLACEMENT LEFT EYE CDE=1.79;  Surgeon: Williams Che, MD;  Location: [facility];  Service: Ophthalmology;  Laterality: Left;  . CERVICAL SPINE SURGERY  2012   8 screws  . CYSTOSCOPY WITH INJECTION N/A 06/03/2013   Procedure: MACROPLASTIQUE  INJECTION ;  Surgeon: Malka So, MD;  Location: [facility];  Service: Urology;  Laterality: N/A;  . INSERTION OF DIALYSIS CATHETER N/A 11/26/2015   Procedure: INSERTION OF DIALYSIS CATHETER;  Surgeon: Angelia Mould, MD;  Location: [place];  Service: Vascular;  Laterality: N/A;  . LAMINECTOMY  12/29/2011  . LUMBAR LAMINECTOMY/DECOMPRESSION MICRODISCECTOMY  12/29/2011   Procedure: LUMBAR LAMINECTOMY/DECOMPRESSION MICRODISCECTOMY;  Surgeon: Floyce Stakes,  MD;  Location: [place] [facility];  Service: Neurosurgery;  Laterality: N/A;  Lumbar Two through Lumbar Five Laminectomies/Cellsaver  . PARTIAL HYSTERECTOMY    . PERIPHERAL VASCULAR CATHETERIZATION N/A 11/22/2015   Procedure: Fistulagram;  Surgeon: Angelia Mould, MD;  Location: [place] CV [facility];  Service: Cardiovascular;  Laterality: N/A;  . SHOULDER SURGERY Right    Rotator cuff    Reviewed. Otherwise negative.   MEDICATIONS:  Prior to Admission medications   Medication Sig Start Date End Date Taking? Authorizing  Provider  albuterol (PROVENTIL HFA;VENTOLIN HFA) 108 (90 BASE) MCG/ACT inhaler Inhale 2 puffs into the lungs every 6 (six) hours as needed for wheezing or shortness of breath. 11/13/15  Yes Merrily Pew, MD  albuterol (PROVENTIL) (2.5 MG/3ML) 0.083% nebulizer solution Take 3 mLs (2.5 mg total) by nebulization every 6 (six) hours as needed for wheezing or shortness of breath. 11/13/15  Yes Merrily Pew, MD  aspirin 81 MG EC tablet Take 81 mg by mouth daily.     Yes Historical Provider, MD  atenolol (TENORMIN) 50 MG tablet TAKE (1) TABLET TWICE DAILY. 09/08/16  Yes Eustaquio Maize, MD  budesonide-formoterol [facility]) 160-4.5 MCG/ACT inhaler Inhale 2 puffs into the lungs 2 (two) times daily. Patient taking differently: Inhale 2 puffs into the lungs 2 (two) times daily as needed.  09/26/15  Yes Eustaquio Maize, MD  Carboxymethylcellulose Sodium (THERATEARS OP) Place 2 drops into both eyes as needed (for dry eyes). Reported on 12/09/2015   Yes Historical Provider, MD  escitalopram (LEXAPRO) 10 MG tablet Take 1 tablet (10 mg total) by mouth daily. 02/01/16  Yes Sharion Balloon, FNP  fluticasone (FLONASE) 50 MCG/ACT nasal spray Place 2 sprays into both nostrils daily. 11/08/15  Yes Historical Provider, MD  glucose blood test strip Test 4 times daily 10/08/15  Yes Eustaquio Maize, MD  hydrALAZINE (APRESOLINE) 50 MG tablet Take 1 tablet (50 mg total) by mouth 2 (two) times daily. 12/09/15  Yes Eustaquio Maize, MD  Insulin Glargine (LANTUS SOLOSTAR) 100 UNIT/ML Solostar Pen Inject 10 Units into the skin daily at 10 pm. 09/13/16  Yes Eustaquio Maize, MD  LORazepam (ATIVAN) 0.5 MG tablet Take 1 tablet (0.5 mg total) by mouth every 8 (eight) hours. Patient taking differently: Take 0.5 mg by mouth every 8 (eight) hours as needed for anxiety.  12/31/15  Yes Minus Breeding, MD  NIFEdipine (PROCARDIA XL/ADALAT-CC) 90 MG 24 hr tablet Take 90 mg by mouth every evening.    Yes Historical Provider, MD  pantoprazole (PROTONIX)  40 MG tablet Take 1 tablet (40 mg total) by mouth daily as needed. 09/13/16  Yes Eustaquio Maize, MD  pravastatin (PRAVACHOL) 20 MG tablet Take 1 tablet (20 mg total) by mouth daily. 09/13/16  Yes Eustaquio Maize, MD  torsemide (DEMADEX) 20 MG tablet Take 2 tablets (40 mg total) by mouth daily. New medicine to reduce fluid build up. Patient taking differently: Take 40-60 mg by mouth daily. **Take 20 mg extra tablet (60mg  total) if weight is over 3-4 pounds. 03/09/15  Yes Rexene Alberts, MD  nitroGLYCERIN (NITROSTAT) 0.4 MG SL tablet Place 1 tablet (0.4 mg total) under the tongue every 5 (five) minutes as needed for chest pain. 05/25/15   Minus Breeding, MD     ALLERGIES:  Allergies  Allergen Reactions  . Ace Inhibitors Cough  . Clopidogrel Bisulfate Other (See Comments)    REACTION: Sick, Headache, "Felt terrible"  . Codeine Other (See Comments)  REACTION: hallucinations  . Lisinopril Other (See Comments)    cough  . Penicillins Other (See Comments)    Has patient had a PCN reaction causing immediate rash, facial/tongue/throat swelling, SOB or lightheadedness with hypotension: unknown Has patient had a PCN reaction causing severe rash involving mucus membranes or skin necrosis: unknown Has patient had a PCN reaction that required hospitalization unknown Has patient had a PCN reaction occurring within the last 10 years:unknown If all of the above answers are "NO", then may proceed with Cephalosporin use.      SOCIAL HISTORY:  Social History   Social History  . Marital status: Widowed    Spouse name: N/A  . Number of children: N/A  . Years of education: N/A   Occupational History  . Not on file.   Social History Main Topics  . Smoking status: Former Smoker    Packs/day: 1.00    Years: 30.00    Quit date: 12/04/2001  . Smokeless tobacco: Never Used  . Alcohol use No  . Drug use: No  . Sexual activity: No   Other Topics Concern  . Not on file   Social History Narrative    Lives in [place]    The patient currently resides (home / rehab facility / nursing home): Home  The patient normally is (ambulatory / bedbound) : Ambulatory   FAMILY HISTORY:  Family History  Problem Relation Age of Onset  . Heart disease Other   . Hypertension Father   . Cancer Father   . Hypertension Daughter   . Pancreatitis Brother   . Healthy Daughter      Otherwise negative.   REVIEW OF SYSTEMS:  Constitutional: denies any other weight loss, fever, chills, or sweats  Eyes: denies any other vision changes, history of eye injury  ENT: denies sore throat, hearing problems  Respiratory: denies shortness of breath, wheezing  Cardiovascular: denies chest pain, palpitations  Gastrointestinal: denies N/V, diarrhea  Genitourinary: denies burning with urination or urinary frequency Musculoskeletal: denies any other joint pains or cramps  Skin: Denies any other rashes or skin discolorations  Neurological: denies any other headache, dizziness, weakness  Psychiatric: denies any other depression, anxiety   All other review of systems were otherwise negative.  VITAL SIGNS:  Temp:  [98 F (36.7 C)] 98 F (36.7 C) (11/17 0648) Pulse Rate:  [54] 54 (11/17 0648) Resp:  [20] 20 (11/17 0648) BP: (144)/(62) 144/62 (11/17 0648) SpO2:  [98 %] 98 % (11/17 0648) Weight:  [77.6 kg (171 lb)] 77.6 kg (171 lb) (11/16 0856)             INTAKE/OUTPUT:  This shift: No intake/output data recorded.  Last 2 shifts: @IOLAST2SHIFTS @  PHYSICAL EXAM:  Constitutional:  -- Normal body habitus  -- Awake, alert, and oriented x3  Eyes:  -- Pupils equally round and reactive to light  -- No scleral icterus  Ear, nose, throat:  -- No jugular venous distension  Pulmonary:  -- No crackles  -- Equal breath sounds bilaterally -- Breathing non-labored at rest Cardiovascular:  -- S1, S2 present  -- No pericardial rubs  Gastrointestinal:  -- Abdomen soft, nontender, nondistended, no  guarding/rebound  -- No abdominal masses appreciated, pulsatile or otherwise  Musculoskeletal and Integumentary:  -- Wounds or skin discoloration: Well-healed RUE brachial-cephalic AV fistula, well-healed and thrombosed Right radial-cephalic AV fistula -- Extremities: B/L UE and LE FROM, hands and feet warm, no edema  Neurologic:  -- Motor function: Intact and symmetric -- Sensation:  Intact and symmetric  Pulse/Doppler Exam: (p=palpable; d=doppler signals; 0=none)     Right   Left   AVF easily palpable mildly pulsatile thrill, focally pulsatile in mid-arm   Brach   p   p   Rad   p   p   Labs:  CBC:  Lab Results  Component Value Date   WBC 9.2 11/13/2015   RBC 3.84 (L) 11/13/2015   BMP:  Lab Results  Component Value Date   GLUCOSE 146 (H) 11/26/2015   CO2 21 11/23/2015   BUN 101 (HH) 11/23/2015   CREATININE 6.59 (>) 11/23/2015   CREATININE 1.66 (H) 03/25/2013   CALCIUM 9.0 11/23/2015     Imaging studies: None   Assessment/Plan: (ICD-10's: T82.858D, N18.6) 65 y.o. female with pulsatility of and recently prolonged bleeding from Right brachial-cephalic AV fistula following access for hemodialysis, complicated by pertinent comorbidities including ESRD, DM, HTN, HLD, CAD, PAD, cerebrovascular disease (history of TIA), COPD, and GERD with esophogeal stricture.     - all risks, benefits, and alternatives to RUE fistulogram with possible intervention were discussed with the patient, who elect(s) to proceed, all of her questions were answered to her expressed satisfaction, and informed consent was obtained.   - plan for RUE fistulogram with possible intervention 11/17   - surgical follow-up 2 weeks following planned procedure   All of the above findings and recommendations were discussed with the patient and her family, and all of her and family's questions were answered to their expressed satisfaction.  -- Marilynne Drivers Rosana Hoes, MD, [place]: [facility] Office: [phone]

## 2016-10-19 ENCOUNTER — Encounter (HOSPITAL_COMMUNITY): Payer: Self-pay

## 2016-10-19 ENCOUNTER — Encounter (HOSPITAL_COMMUNITY)
Admission: RE | Admit: 2016-10-19 | Discharge: 2016-10-19 | Disposition: A | Discharge status: Home / Self Care | Payer: Medicare Other | Source: Ambulatory Visit | Attending: Surgery | Admitting: Surgery

## 2016-10-20 ENCOUNTER — Ambulatory Visit (HOSPITAL_COMMUNITY)
Admission: RE | Admit: 2016-10-20 | Discharge: 2016-10-20 | Disposition: A | Discharge status: Home / Self Care | Payer: Medicare Other | Source: Ambulatory Visit | Attending: Surgery | Admitting: Surgery

## 2016-10-20 ENCOUNTER — Encounter (HOSPITAL_COMMUNITY)
Admission: RE | Disposition: A | Discharge status: Home / Self Care | Payer: Self-pay | Source: Ambulatory Visit | Attending: Surgery

## 2016-10-20 ENCOUNTER — Ambulatory Visit (HOSPITAL_COMMUNITY): Payer: Medicare Other

## 2016-10-20 DIAGNOSIS — E1151 Type 2 diabetes mellitus with diabetic peripheral angiopathy without gangrene: Secondary | ICD-10-CM | POA: Insufficient documentation

## 2016-10-20 DIAGNOSIS — Z880 Allergy status to penicillin: Secondary | ICD-10-CM | POA: Insufficient documentation

## 2016-10-20 DIAGNOSIS — Z885 Allergy status to narcotic agent status: Secondary | ICD-10-CM | POA: Diagnosis not present

## 2016-10-20 DIAGNOSIS — T82838D Hemorrhage of vascular prosthetic devices, implants and grafts, subsequent encounter: Secondary | ICD-10-CM | POA: Insufficient documentation

## 2016-10-20 DIAGNOSIS — I5033 Acute on chronic diastolic (congestive) heart failure: Secondary | ICD-10-CM | POA: Insufficient documentation

## 2016-10-20 DIAGNOSIS — F419 Anxiety disorder, unspecified: Secondary | ICD-10-CM | POA: Insufficient documentation

## 2016-10-20 DIAGNOSIS — Z87891 Personal history of nicotine dependence: Secondary | ICD-10-CM | POA: Diagnosis not present

## 2016-10-20 DIAGNOSIS — K219 Gastro-esophageal reflux disease without esophagitis: Secondary | ICD-10-CM | POA: Insufficient documentation

## 2016-10-20 DIAGNOSIS — T82858D Stenosis of vascular prosthetic devices, implants and grafts, subsequent encounter: Secondary | ICD-10-CM | POA: Insufficient documentation

## 2016-10-20 DIAGNOSIS — Z7902 Long term (current) use of antithrombotics/antiplatelets: Secondary | ICD-10-CM | POA: Diagnosis not present

## 2016-10-20 DIAGNOSIS — I132 Hypertensive heart and chronic kidney disease with heart failure and with stage 5 chronic kidney disease, or end stage renal disease: Secondary | ICD-10-CM | POA: Insufficient documentation

## 2016-10-20 DIAGNOSIS — Z79899 Other long term (current) drug therapy: Secondary | ICD-10-CM | POA: Diagnosis not present

## 2016-10-20 DIAGNOSIS — I2510 Atherosclerotic heart disease of native coronary artery without angina pectoris: Secondary | ICD-10-CM | POA: Diagnosis not present

## 2016-10-20 DIAGNOSIS — Y832 Surgical operation with anastomosis, bypass or graft as the cause of abnormal reaction of the patient, or of later complication, without mention of misadventure at the time of the procedure: Secondary | ICD-10-CM | POA: Insufficient documentation

## 2016-10-20 DIAGNOSIS — Z992 Dependence on renal dialysis: Secondary | ICD-10-CM | POA: Insufficient documentation

## 2016-10-20 DIAGNOSIS — J449 Chronic obstructive pulmonary disease, unspecified: Secondary | ICD-10-CM | POA: Diagnosis not present

## 2016-10-20 DIAGNOSIS — E559 Vitamin D deficiency, unspecified: Secondary | ICD-10-CM | POA: Diagnosis not present

## 2016-10-20 DIAGNOSIS — E785 Hyperlipidemia, unspecified: Secondary | ICD-10-CM | POA: Diagnosis not present

## 2016-10-20 DIAGNOSIS — T829XXA Unspecified complication of cardiac and vascular prosthetic device, implant and graft, initial encounter: Secondary | ICD-10-CM

## 2016-10-20 DIAGNOSIS — Z7982 Long term (current) use of aspirin: Secondary | ICD-10-CM | POA: Insufficient documentation

## 2016-10-20 DIAGNOSIS — Z794 Long term (current) use of insulin: Secondary | ICD-10-CM | POA: Insufficient documentation

## 2016-10-20 DIAGNOSIS — N186 End stage renal disease: Secondary | ICD-10-CM | POA: Diagnosis not present

## 2016-10-20 DIAGNOSIS — K222 Esophageal obstruction: Secondary | ICD-10-CM | POA: Diagnosis not present

## 2016-10-20 DIAGNOSIS — Z888 Allergy status to other drugs, medicaments and biological substances status: Secondary | ICD-10-CM | POA: Diagnosis not present

## 2016-10-20 DIAGNOSIS — Z9861 Coronary angioplasty status: Secondary | ICD-10-CM | POA: Insufficient documentation

## 2016-10-20 DIAGNOSIS — E1140 Type 2 diabetes mellitus with diabetic neuropathy, unspecified: Secondary | ICD-10-CM | POA: Insufficient documentation

## 2016-10-20 DIAGNOSIS — E1122 Type 2 diabetes mellitus with diabetic chronic kidney disease: Secondary | ICD-10-CM | POA: Diagnosis not present

## 2016-10-20 HISTORY — PX: FISTULOGRAM: SHX5832

## 2016-10-20 LAB — POCT I-STAT, CHEM 8
BUN: 29 mg/dL — ABNORMAL HIGH (ref 6–20)
Calcium, Ion: 1.09 mmol/L — ABNORMAL LOW (ref 1.15–1.40)
Chloride: 101 mmol/L (ref 101–111)
Creatinine, Ser: 3.7 mg/dL — ABNORMAL HIGH (ref 0.44–1.00)
Glucose, Bld: 97 mg/dL (ref 65–99)
HCT: 36 % (ref 36.0–46.0)
Hemoglobin: 12.2 g/dL (ref 12.0–15.0)
Potassium: 3.3 mmol/L — ABNORMAL LOW (ref 3.5–5.1)
Sodium: 143 mmol/L (ref 135–145)
TCO2: 28 mmol/L (ref 0–100)

## 2016-10-20 LAB — GLUCOSE, CAPILLARY: Glucose-Capillary: 98 mg/dL (ref 65–99)

## 2016-10-20 SURGERY — FISTULOGRAM
Anesthesia: LOCAL | Site: Arm Upper | Laterality: Right

## 2016-10-20 MED ORDER — IOPAMIDOL (ISOVUE-300) INJECTION 61%
INTRAVENOUS | Status: AC
  Filled 2016-10-20: qty 50

## 2016-10-20 MED ORDER — SODIUM CHLORIDE 0.9 % IJ SOLN
INTRAMUSCULAR | Status: AC
  Filled 2016-10-20: qty 20

## 2016-10-20 MED ORDER — BUPIVACAINE HCL (PF) 0.5 % IJ SOLN
INTRAMUSCULAR | Status: AC
  Filled 2016-10-20: qty 30

## 2016-10-20 MED ORDER — CHLORHEXIDINE GLUCONATE CLOTH 2 % EX PADS: 6.0000 | MEDICATED_PAD | Freq: Once | CUTANEOUS | Status: DC

## 2016-10-20 MED ORDER — IOPAMIDOL (ISOVUE-300) INJECTION 61%
INTRAVENOUS | Status: DC | PRN
  Administered 2016-10-20: 26 mL via INTRAVENOUS

## 2016-10-20 MED ORDER — LIDOCAINE HCL (PF) 1 % IJ SOLN
INTRAMUSCULAR | Status: AC
  Filled 2016-10-20: qty 30

## 2016-10-20 MED ORDER — LIDOCAINE HCL 1 % IJ SOLN
INTRAMUSCULAR | Status: DC | PRN
  Administered 2016-10-20: 1 mL

## 2016-10-20 MED ORDER — HEPARIN SODIUM (PORCINE) 1000 UNIT/ML IJ SOLN
INTRAMUSCULAR | Status: AC
  Filled 2016-10-20: qty 3

## 2016-10-20 MED ORDER — HEPARIN SODIUM (PORCINE) 5000 UNIT/ML IJ SOLN
INTRAMUSCULAR | Status: DC | PRN
  Administered 2016-10-20: 300 mL

## 2016-10-20 MED ORDER — SODIUM CHLORIDE 0.9 % IV SOLN
Freq: Once | INTRAVENOUS | Status: DC
  Filled 2016-10-20: qty 1.2

## 2016-10-20 MED ORDER — HEPARIN SODIUM (PORCINE) 1000 UNIT/ML IJ SOLN
INTRAMUSCULAR | Status: DC | PRN
  Administered 2016-10-20: 3000 [IU] via INTRAVENOUS

## 2016-10-20 SURGICAL SUPPLY — 53 items
BAG DECANTER FOR FLEXI CONT (MISCELLANEOUS) ×4 IMPLANT
BALLN LUTONIX 6X150X130 (BALLOONS)
BALLN MUSTANG 5.0X40 135 (BALLOONS)
BALLN MUSTANG 6.0X40 75 (BALLOONS) ×2
BALLN MUSTANG 7.0X40 75 (BALLOONS)
BALLN MUSTANG 8.0X40 75 (BALLOONS) ×2
BALLN MUSTANG 9X40X75 (BALLOONS)
BALLOON LUTONIX 6X150X130 (BALLOONS) IMPLANT
BALLOON MUSTANG 5.0X40 135 (BALLOONS) IMPLANT
BALLOON MUSTANG 6.0X40 75 (BALLOONS) IMPLANT
BALLOON MUSTANG 7.0X40 75 (BALLOONS) IMPLANT
BALLOON MUSTANG 8.0X40 75 (BALLOONS) IMPLANT
BALLOON MUSTANG 9X40X75 (BALLOONS) IMPLANT
CHLORAPREP W/TINT 26ML (MISCELLANEOUS) ×4 IMPLANT
COVER LIGHT HANDLE STERIS (MISCELLANEOUS) ×4 IMPLANT
DECANTER SPIKE VIAL GLASS SM (MISCELLANEOUS) ×6 IMPLANT
DEVICE INFLATION ENCORE 26 (MISCELLANEOUS) ×2 IMPLANT
DRAPE BRACHIAL (DRAPES) ×2 IMPLANT
DRAPE C-ARM FOLDED MOBILE STRL (DRAPES) ×2 IMPLANT
DRAPE PROXIMA HALF (DRAPES) ×1 IMPLANT
DRSG TEGADERM 2-3/8X2-3/4 SM (GAUZE/BANDAGES/DRESSINGS) ×2 IMPLANT
ELECT REM PT RETURN 9FT ADLT (ELECTROSURGICAL) ×2
ELECTRODE REM PT RTRN 9FT ADLT (ELECTROSURGICAL) ×1 IMPLANT
GLOVE BIOGEL PI IND STRL 7.0 (GLOVE) ×1 IMPLANT
GLOVE BIOGEL PI IND STRL 7.5 (GLOVE) ×1 IMPLANT
GLOVE BIOGEL PI INDICATOR 7.0 (GLOVE) ×1
GLOVE BIOGEL PI INDICATOR 7.5 (GLOVE) ×1
GLOVE ECLIPSE 7.0 STRL STRAW (GLOVE) ×2 IMPLANT
GOWN STRL REUS W/ TWL LRG LVL3 (GOWN DISPOSABLE) ×2 IMPLANT
GOWN STRL REUS W/TWL LRG LVL3 (GOWN DISPOSABLE) ×4
GUIDEWIRE ANGLED .035 180CM (WIRE) ×1 IMPLANT
IV NS 500ML (IV SOLUTION) ×2
IV NS 500ML BAXH (IV SOLUTION) ×1 IMPLANT
KIT BLADEGUARD II DBL (SET/KITS/TRAYS/PACK) ×2 IMPLANT
KIT ROOM TURNOVER APOR (KITS) ×2 IMPLANT
MICROPUNCTURE 4FR NT-U-SST (SHEATH) ×2
NDL HYPO 18GX1.5 BLUNT FILL (NEEDLE) ×1 IMPLANT
NDL HYPO 25X1 1.5 SAFETY (NEEDLE) ×1 IMPLANT
NEEDLE HYPO 18GX1.5 BLUNT FILL (NEEDLE) ×2 IMPLANT
NEEDLE HYPO 25X1 1.5 SAFETY (NEEDLE) ×2 IMPLANT
PACK BASIC III (CUSTOM PROCEDURE TRAY) ×2
PACK SRG BSC III STRL LF ECLPS (CUSTOM PROCEDURE TRAY) ×1 IMPLANT
PAD ARMBOARD 7.5X6 YLW CONV (MISCELLANEOUS) ×4 IMPLANT
SCALPEL PROTECTED #11 DISP (BLADE) ×1 IMPLANT
SET BASIN LINEN APH (SET/KITS/TRAYS/PACK) ×2 IMPLANT
SET MICROPUNCTURE 4FR NT-U-SST (SHEATH) ×1 IMPLANT
SHEATH PINNACLE R/O II 6F 4CM (SHEATH) ×3 IMPLANT
SPONGE GAUZE 2X2 8PLY STRL LF (GAUZE/BANDAGES/DRESSINGS) ×2 IMPLANT
SUT MNCRL AB 4-0 PS2 18 (SUTURE) ×2 IMPLANT
SYR 20CC LL (SYRINGE) ×2 IMPLANT
SYR 5ML LL (SYRINGE) ×2 IMPLANT
SYR CONTROL 10ML LL (SYRINGE) ×2 IMPLANT
SYRINGE 10CC LL (SYRINGE) ×4 IMPLANT

## 2016-10-20 NOTE — Interval H&P Note (Signed)
History and Physical Interval Note:  10/20/2016 7:31 AM  [name]  has presented today for surgery, with the diagnosis of fistula disfunction right arm  The various methods of treatment have been discussed with the patient and family. After consideration of risks, benefits and other options for treatment, the patient has consented to  Procedure(s): FISTULOGRAM WITH POSSIBLE INTERVENTION (N/A) as a surgical intervention .  [name] history has been reviewed, patient examined, no change in status, stable for surgery.  I have reviewed the patient's chart and labs.  Questions were answered to the patient's satisfaction.     [name]

## 2016-10-20 NOTE — OR Nursing (Signed)
10/20/16 all counts correct -verified by k.quinn,rn, b.roberts,cst - 2x2 dressing with small opsite applied to wound post op, (clean,dry, intact)

## 2016-10-20 NOTE — Op Note (Addendum)
SURGICAL ANGIOGRAPY/OPERATIVE REPORT   DATE OF PROCEDURE: 10/20/2016   ATTENDING SURGEON: Corene Cornea E. Rosana Hoes, MD  ANESTHESIA: Local  PRE-OPERATIVE DIAGNOSIS: Right upper extremity brachial-cephalic AV fistula dysfunction with prolonged bleeding and pulsatility (icd-10's: T82.858D)  POST-OPERATIVE DIAGNOSIS: Right upper extremity brachial-cephalic AV fistula dysfunction with prolonged bleeding and pulsatility (icd-10's: T82.858D)  PROCEDURE(S): (cpt: 34742) 1.) Percutaneous antegrade access of RUE brachial-cephalic AV fistula  2.) AV fistulogram with and without compression  3.) Central venogram  4.) Angioplasty of proximal-/mid- arm cephalic vein with 6 mm x 40 mm Mustang balloon  5.) Angioplasty of proximal-/mid- arm cephalic vein with 8 mm x 40 mm Mustang balloon  6.) Limited RUE arteriogram and AV fistula anastamotic angiogram   INTRAOPERATIVE FINDINGS: patent and well-matured RUE brachial-cephalic AV fistula with 59% proximal-/mid- arm cephalic vein stenosis at site of cephalic collateral venous branch, <40% residual relative stenosis at completion of procedure  INTRAOPERATIVE FLUIDS: 0 mL crystalloid and 26 mL contrast used   FLUOROSCOPY: 2.4 minutes  ESTIMATED BLOOD LOSS: Minimal (<20 mL)   SPECIMENS: None   IMPLANTS: None   DRAINS: None   COMPLICATIONS: None apparent   CONDITION AT COMPLETION: Hemodynamically stable, awake   PULSE/DOPPLER EXAM AT END OF PROCEDURE:   (p=palpable; d=doppler signals; 0=none)       Right   AVF  easily palpable non-pulsatile thrill   Brach    p   Rad    p   DISPOSITION: Recovery   INDICATION(S) FOR PROCEDURE:  Patient is a right-handed 65 y.o. female with ESRD on Tuesday/Thursday/Saturday hemodialysis via RUE brachial-cephalic AV fistula created ~1 year ago. Due to concern regarding prolonged bleeding following access for hemodialysis and pulsatility suggestive of fistula outflow venous stenosis, patient presents for angiographic  evaluation of AV fistula with possible intervention. All risks, benefits, and alternatives to above elective procedures were discussed with the patient, who elected to proceed, and informed consent was accordingly obtained at that time.   DETAILS OF PROCEDURE:  Patient was brought to the operative suite and appropriately identified. In supine position, access sites were prepped and draped in the usual sterile fashion, and following a brief timeout, percutaneous RUE AV fistula was accessed in antegrade fashion using Seldinger technique, by which local anesthetic was injected over the fistula venous access within 1 cm of the anastomosis, and micropuncture access needle was inserted into the fistula, through which micropuncture guidewire was advanced, over which the access needle was withdrawn, and micropuncture sheath was advanced and flushed with heparinized saline. Through the micropuncture sheath, RUE fistulogram, was performed with visualization of patent and well-matured RUE brachial-cephalic AV fistula with 56% proximal-/mid- arm cephalic vein stenosis at site of cephalic vein collateral venous branch and no central venous outflow stenosis. Fistula was then compressed, and angiogram revealed no stenosis at the anastomosis.   A glidewire was advanced through the AV fistula, into its venous outflow and into the Right subclavian vein, over which the micropuncture sheath was removed and exchanged for a 52F x 4 cm sheath, which was flushed with heparinized saline, and 3000 Units of heparin were infused into the sheath, through which a 6 mm x 40 mm Mustang angioplasty balloon was advanced over the guidewire across the venous outflow stenosis and inflated with 50% residual stenosis on completion angiogram. A second 8 mm x 40 mm Mustang angioplasty balloon was likewise advanced over the guidewire and insufflated with <40% residual stenosis on completion angiography. No further interventions were indicated at this  time.   If patient  continues to have any difficulty with prolonged bleeding or pulsatility of the AV fistula, will consider cephalic vein branch ligation +/- drug-coated balloon angioplasty or cutting balloon angioplasty.  A single 4-0 Monocryl U-stitch was placed in skin surrounding the sheath, which was withdrawn, and brief direct pressure was applied for hemostasis.  I was present for all aspects of the procedures, and there were no intraprocedural complications apparent.

## 2016-10-20 NOTE — Discharge Instructions (Signed)
Fistulogram, Care After Introduction Refer to this sheet in the next few weeks. These instructions provide you with information on caring for yourself after your procedure. Your health care provider may also give you more specific instructions. Your treatment has been planned according to current medical practices, but problems sometimes occur. Call your health care provider if you have any problems or questions after your procedure. What can I expect after the procedure? After your procedure, it is typical to have the following:  A small amount of discomfort in the area where the catheters were placed.  A small amount of bruising around the fistula.  Sleepiness and fatigue. Follow these instructions at home:  Rest at home for the day following your procedure.  Do not drive or operate heavy machinery while taking pain medicine.  Take medicines only as directed by your health care provider.  Do not take baths, swim, or use a hot tub until your health care provider approves. You may shower 24 hours after the procedure or as directed by your health care provider.  There are many different ways to close and cover an incision, including stitches, skin glue, and adhesive strips. Follow your health care provider's instructions on:  Incision care.  Bandage (dressing) changes and removal.  Incision closure removal.  Monitor your dialysis fistula carefully. Contact a health care provider if:  You have drainage, redness, swelling, or pain at your catheter site.  You have a fever.  You have chills. Get help right away if:  You feel weak.  You have trouble balancing.  You have trouble moving your arms or legs.  You have problems with your speech or vision.  You can no longer feel a vibration or buzz when you put your fingers over your dialysis fistula.  The limb that was used for the procedure:  Swells.  Is painful.  Is cold.  Is discolored, such as blue or pale  white. This information is not intended to replace advice given to you by your health care provider. Make sure you discuss any questions you have with your health care provider. Document Released: 04/06/2014 Document Revised: 04/27/2016 Document Reviewed: 01/09/2014  2017 Elsevier    FOLLOW UP APPOINTMENT November 29TH  @ 1:30 pm with DR. DAVIS.

## 2016-10-23 ENCOUNTER — Encounter (HOSPITAL_COMMUNITY): Payer: Self-pay | Admitting: Surgery

## 2016-11-06 LAB — HM DIABETES EYE EXAM

## 2016-12-13 ENCOUNTER — Ambulatory Visit: Payer: Medicare Other | Admitting: Pediatrics

## 2016-12-13 ENCOUNTER — Ambulatory Visit (INDEPENDENT_AMBULATORY_CARE_PROVIDER_SITE_OTHER): Payer: Medicare Other | Admitting: Pediatrics

## 2016-12-13 ENCOUNTER — Encounter: Payer: Self-pay | Admitting: Pediatrics

## 2016-12-13 VITALS — BP 139/61 | HR 57 | Temp 97.8°F | Resp 18 | Ht 63.75 in | Wt 169.4 lb

## 2016-12-13 DIAGNOSIS — F419 Anxiety disorder, unspecified: Secondary | ICD-10-CM | POA: Diagnosis not present

## 2016-12-13 DIAGNOSIS — I25119 Atherosclerotic heart disease of native coronary artery with unspecified angina pectoris: Secondary | ICD-10-CM

## 2016-12-13 DIAGNOSIS — I10 Essential (primary) hypertension: Secondary | ICD-10-CM

## 2016-12-13 DIAGNOSIS — J449 Chronic obstructive pulmonary disease, unspecified: Secondary | ICD-10-CM

## 2016-12-13 DIAGNOSIS — I5032 Chronic diastolic (congestive) heart failure: Secondary | ICD-10-CM | POA: Diagnosis not present

## 2016-12-13 MED ORDER — LORAZEPAM 0.5 MG PO TABS: 0.5000 mg | ORAL_TABLET | Freq: Three times a day (TID) | ORAL | 2 refills | Status: DC | PRN

## 2016-12-13 NOTE — Progress Notes (Signed)
  Subjective:   Patient ID: [name], female    DOB: [birth year], 66 y.o.   MRN: [id number] CC: Hospitalization Follow-up (Pneumonia)  HPI: [name] is a 66 y.o. female presenting for Hospitalization Follow-up (Pneumonia)  DM2: A1c end of Dec at dialysis 5.8 Some high BGLs in the hospital  Was recently admitted with pneumonia Done with antibiotics Breathing is back to normal Feeling back to normal Back at regular dialysis No SOB, no CP, feeling well now  Using ativan rarely Does help with symptoms  Relevant past medical, surgical, family and social history reviewed. Allergies and medications reviewed and updated. History  Smoking Status  . Former Smoker  . Packs/day: 1.00  . Years: 30.00  . Quit date: 12/04/2001  Smokeless Tobacco  . Never Used   ROS: Per HPI   Objective:    BP 139/61   Pulse (!) 57   Temp 97.8 F (36.6 C) (Oral)   Resp 18   Ht 5' 3.75" (1.619 m)   Wt 169 lb 6.4 oz (76.8 kg)   SpO2 96%   BMI 29.31 kg/m   Wt Readings from Last 3 Encounters:  12/13/16 169 lb 6.4 oz (76.8 kg)  10/19/16 171 lb (77.6 kg)  10/09/16 171 lb (77.6 kg)    Gen: NAD, alert, cooperative with exam, NCAT EYES: EOMI, no conjunctival injection, or no icterus CV: NRRR, normal S1/S2, no murmur, distal pulses 2+ b/l, AV fistula with palpable thrill R upper arm Resp: CTABL, no wheezes, normal WOB Abd: +BS, soft, NTND. no guarding or organomegaly Ext: No edema, warm Neuro: Alert and oriented MSK: normal muscle bulk  Assessment & Plan:  Dallie was seen today for multiple med prob follow-up.  Diagnoses and all orders for this visit:  HYPERTENSION, BENIGN Adequate control, cont current meds  Anxiety Well controlled, take ativan as needed If needing more often, needs to be seen -     LORazepam (ATIVAN) 0.5 MG tablet; Take 1 tablet (0.5 mg total) by mouth every 8 (eight) hours as needed for anxiety.  Atherosclerosis of native coronary artery of native heart with  angina pectoris (HCC) No chest pain, symptoms conrolled  Chronic diastolic CHF (congestive heart failure) (HCC) stable  Chronic obstructive pulmonary disease, unspecified COPD type ([place]) Stable, lung exam normal, breathing back to normal after recent pneumonia  Follow up plan: Return in about 3 months (around 03/13/2017). [name], MD [place]

## 2016-12-14 ENCOUNTER — Other Ambulatory Visit: Payer: Self-pay | Admitting: Pediatrics

## 2016-12-14 DIAGNOSIS — E785 Hyperlipidemia, unspecified: Secondary | ICD-10-CM

## 2016-12-18 ENCOUNTER — Ambulatory Visit: Payer: Medicare Other | Admitting: Pediatrics

## 2016-12-31 IMAGING — CR DG CHEST 1V PORT
1 series · 1 of 1 positions shown · non-contrast
Comparison: 11/13/2015

CLINICAL DATA: Status post dialysis catheter placement

EXAM:
PORTABLE CHEST - 1 VIEW

[AP]
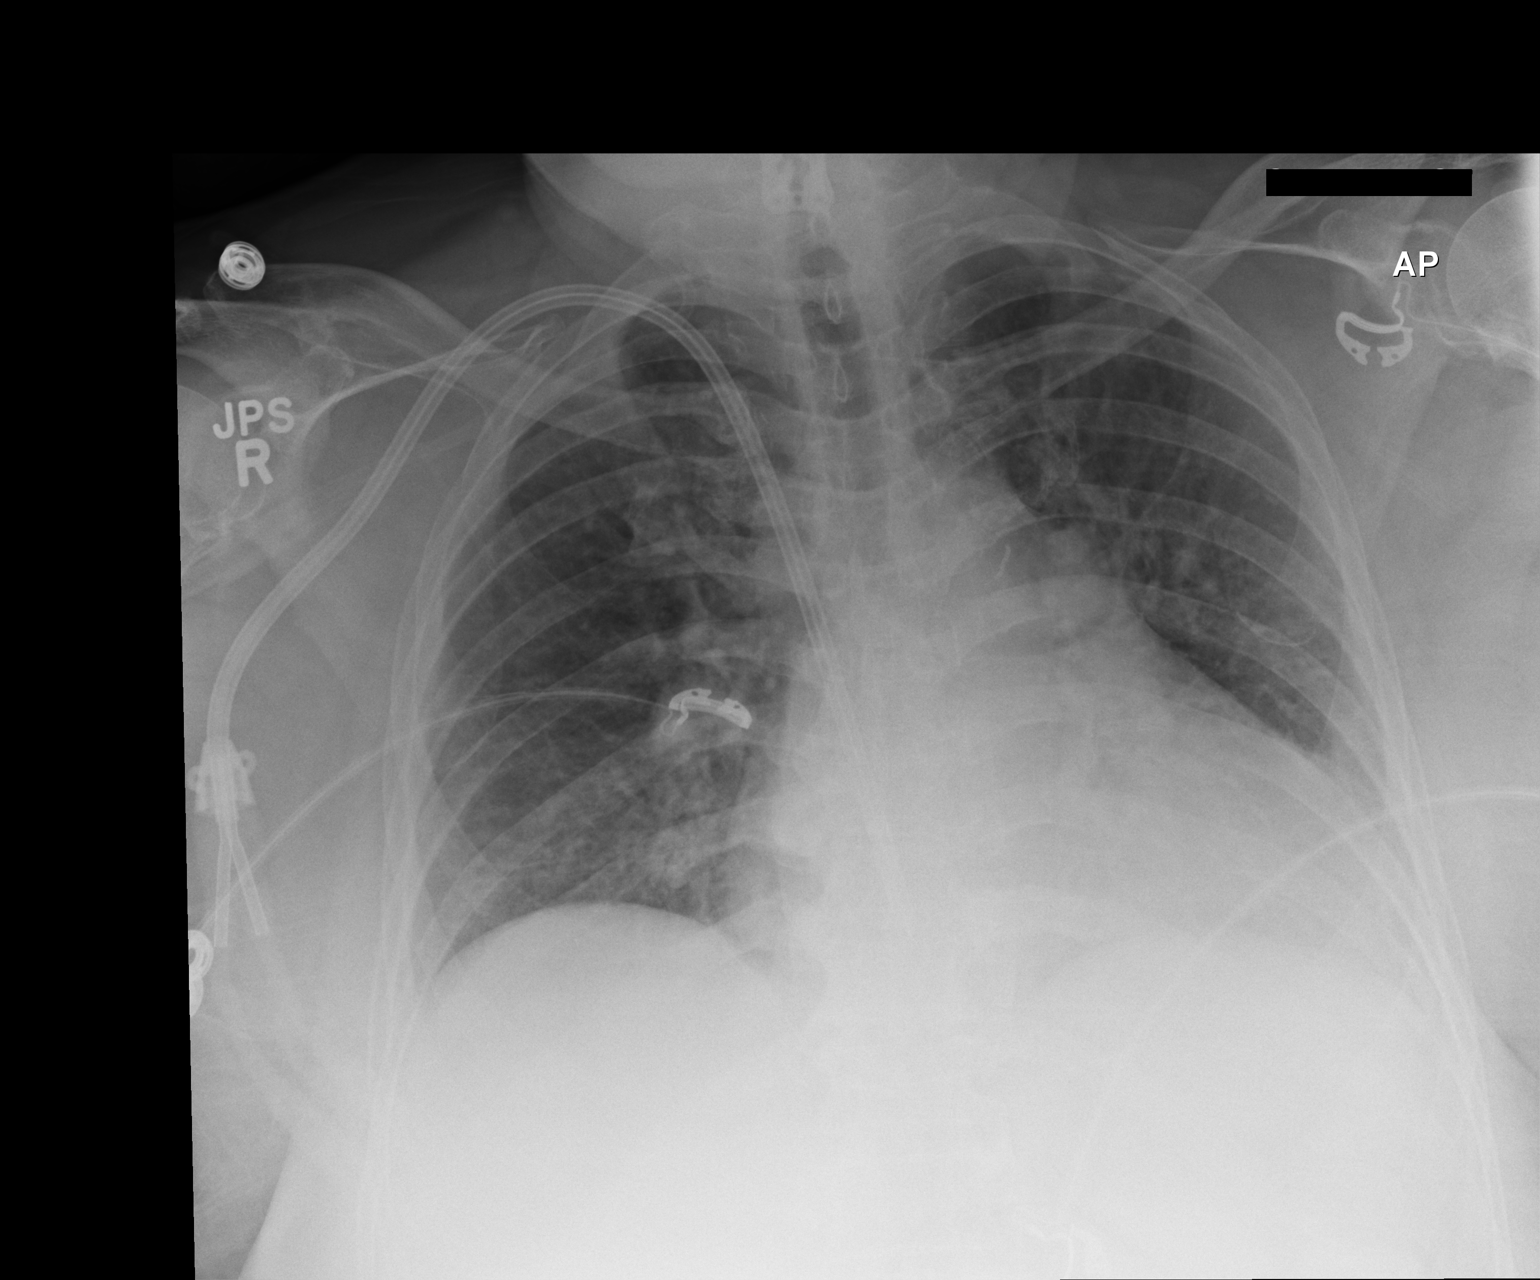

[1 of 1 positions shown; findings below may reference images not displayed]

FINDINGS: Cardiac shadow is mildly enlarged but stable. A new dialysis
catheter is seen without evidence of pneumothorax. The overall
inspiratory effort is poor with crowding of the vascular markings.
Postsurgical changes in the cervical spine are again seen.
IMPRESSION: Status post dialysis catheter placement without complicating
factors.

## 2017-01-03 ENCOUNTER — Ambulatory Visit (INDEPENDENT_AMBULATORY_CARE_PROVIDER_SITE_OTHER): Payer: Medicare Other | Admitting: Pediatrics

## 2017-01-03 DIAGNOSIS — I132 Hypertensive heart and chronic kidney disease with heart failure and with stage 5 chronic kidney disease, or end stage renal disease: Secondary | ICD-10-CM

## 2017-01-03 DIAGNOSIS — J440 Chronic obstructive pulmonary disease with acute lower respiratory infection: Secondary | ICD-10-CM

## 2017-01-03 DIAGNOSIS — J189 Pneumonia, unspecified organism: Secondary | ICD-10-CM

## 2017-01-03 DIAGNOSIS — E1122 Type 2 diabetes mellitus with diabetic chronic kidney disease: Secondary | ICD-10-CM

## 2017-01-03 DIAGNOSIS — I509 Heart failure, unspecified: Secondary | ICD-10-CM | POA: Diagnosis not present

## 2017-01-03 DIAGNOSIS — I2510 Atherosclerotic heart disease of native coronary artery without angina pectoris: Secondary | ICD-10-CM | POA: Diagnosis not present

## 2017-01-03 DIAGNOSIS — D631 Anemia in chronic kidney disease: Secondary | ICD-10-CM

## 2017-01-03 DIAGNOSIS — E1140 Type 2 diabetes mellitus with diabetic neuropathy, unspecified: Secondary | ICD-10-CM

## 2017-01-03 DIAGNOSIS — N186 End stage renal disease: Secondary | ICD-10-CM

## 2017-03-16 ENCOUNTER — Ambulatory Visit (INDEPENDENT_AMBULATORY_CARE_PROVIDER_SITE_OTHER): Payer: Medicare Other | Admitting: Pediatrics

## 2017-03-16 ENCOUNTER — Encounter: Payer: Self-pay | Admitting: Pediatrics

## 2017-03-16 VITALS — BP 141/54 | HR 53 | Temp 97.8°F | Ht 63.75 in | Wt 166.0 lb

## 2017-03-16 DIAGNOSIS — E1121 Type 2 diabetes mellitus with diabetic nephropathy: Secondary | ICD-10-CM

## 2017-03-16 DIAGNOSIS — F419 Anxiety disorder, unspecified: Secondary | ICD-10-CM

## 2017-03-16 DIAGNOSIS — I10 Essential (primary) hypertension: Secondary | ICD-10-CM

## 2017-03-16 DIAGNOSIS — J302 Other seasonal allergic rhinitis: Secondary | ICD-10-CM | POA: Diagnosis not present

## 2017-03-16 DIAGNOSIS — E785 Hyperlipidemia, unspecified: Secondary | ICD-10-CM

## 2017-03-16 MED ORDER — ESCITALOPRAM OXALATE 10 MG PO TABS: 10.0000 mg | ORAL_TABLET | Freq: Every day | ORAL | 3 refills | Status: DC

## 2017-03-16 MED ORDER — LORAZEPAM 0.5 MG PO TABS: 0.5000 mg | ORAL_TABLET | Freq: Three times a day (TID) | ORAL | 1 refills | Status: DC | PRN

## 2017-03-16 MED ORDER — CETIRIZINE HCL 5 MG PO TABS: 5.0000 mg | ORAL_TABLET | ORAL | 2 refills | Status: DC

## 2017-03-16 NOTE — Progress Notes (Signed)
  Subjective:   Patient ID: [name], female    DOB: 03-23-1951, 66 y.o.   MRN: [id number] CC: Follow-up (3 month) multiple med problems HPI: [name] is a 66 y.o. female presenting for Follow-up (3 month)  DM2: last A1c 6.0 on 02/27/2017 Taking 15u of lantus at night AM BGLs around 100  CHF: no swelling, no SOB  ESRD: dialysis T/R/Sat, Dr. Malka So Has been rec to have phos binder, pt declined, rechecking in a couple   Depression: since she has run out of the lexapro a month ago she has taken ativan more often, not daily, last about a week ago  Two nights ago had episode of nausea, vomited once, no blood Went through dialysis fine yesterday This was the second time, rarely happens Feels fine now  COPD: on symbicort, albuterol Sometimes has phlegm, starts coughing more Not all the time  Anxiety: rarely needs xanax, takes less than once a week Taking lexapro daily, thinks it has been helping  Relevant past medical, surgical, family and social history reviewed. Allergies and medications reviewed and updated. History  Smoking Status  . Former Smoker  . Packs/day: 1.00  . Years: 30.00  . Quit date: 12/04/2001  Smokeless Tobacco  . Never Used   ROS: Per HPI   Objective:    BP (!) 141/54   Pulse (!) 53   Temp 97.8 F (36.6 C) (Oral)   Ht 5' 3.75" (1.619 m)   Wt 166 lb (75.3 kg)   BMI 28.72 kg/m   Wt Readings from Last 3 Encounters:  03/16/17 166 lb (75.3 kg)  12/13/16 169 lb 6.4 oz (76.8 kg)  10/19/16 171 lb (77.6 kg)    Gen: NAD, alert, cooperative with exam, NCAT EYES: EOMI, no conjunctival injection, or no icterus ENT:  TMs pearly gray b/l, OP without erythema LYMPH: no cervical LAD CV: NRRR, normal S1/S2, AV fistula R upper arm with thrill, no tenderness, redness Resp: CTABL, no wheezes, normal WOB Abd: +BS, soft, NTND.  Ext: No edema, warm Neuro: Alert and oriented  Assessment & Plan:  [name] was seen today for follow-up.  Diagnoses and  all orders for this visit:  Type II diabetes mellitus with nephropathy ([place]) On dialysis T/R/Sat A1c most recently 6.0 Taking insulin nightly, no lows  Hyperlipidemia, unspecified hyperlipidemia type Stable, on pravastatin, cont  HYPERTENSION, BENIGN stable SBP slightly elevated today, DBP low No swelling Cont current meds  Anxiety Stable, cont below medications If needing ativan more oftne let me know -     LORazepam (ATIVAN) 0.5 MG tablet; Take 1 tablet (0.5 mg total) by mouth every 8 (eight) hours as needed for anxiety. -     escitalopram (LEXAPRO) 10 MG tablet; Take 1 tablet (10 mg total) by mouth daily.  Seasonal allergic rhinitis, unspecified chronicity, unspecified trigger Ongoing symptoms Start below -     cetirizine (ZYRTEC) 5 MG tablet; Take 1 tablet (5 mg total) by mouth 3 (three) times a week.   Follow up plan: Return in about 3 months (around 06/15/2017). [name], MD [place]

## 2017-04-04 ENCOUNTER — Ambulatory Visit: Payer: Medicare Other | Admitting: Family Medicine

## 2017-04-17 ENCOUNTER — Other Ambulatory Visit: Payer: Self-pay | Admitting: Pediatrics

## 2017-05-21 ENCOUNTER — Telehealth: Payer: Self-pay | Admitting: *Deleted

## 2017-05-21 NOTE — Telephone Encounter (Signed)
What was the medicine?

## 2017-05-22 NOTE — Telephone Encounter (Signed)
Lorazepam ° °

## 2017-05-23 NOTE — Telephone Encounter (Signed)
TC to [facility] pt did refill med on 6/15. No refills authorized at this time

## 2017-05-23 NOTE — Telephone Encounter (Signed)
She just filled #20 tabs 6/15. She should have enough until her next appt with me 7/13.

## 2017-06-15 ENCOUNTER — Encounter: Payer: Self-pay | Admitting: Pediatrics

## 2017-06-15 ENCOUNTER — Ambulatory Visit (INDEPENDENT_AMBULATORY_CARE_PROVIDER_SITE_OTHER): Payer: Medicare Other | Admitting: Pediatrics

## 2017-06-15 VITALS — BP 139/59 | HR 52 | Temp 98.1°F | Ht 63.75 in | Wt 167.8 lb

## 2017-06-15 DIAGNOSIS — Z794 Long term (current) use of insulin: Secondary | ICD-10-CM

## 2017-06-15 DIAGNOSIS — E1122 Type 2 diabetes mellitus with diabetic chronic kidney disease: Secondary | ICD-10-CM

## 2017-06-15 DIAGNOSIS — L299 Pruritus, unspecified: Secondary | ICD-10-CM | POA: Diagnosis not present

## 2017-06-15 DIAGNOSIS — I10 Essential (primary) hypertension: Secondary | ICD-10-CM

## 2017-06-15 DIAGNOSIS — R0789 Other chest pain: Secondary | ICD-10-CM | POA: Diagnosis not present

## 2017-06-15 DIAGNOSIS — N184 Chronic kidney disease, stage 4 (severe): Secondary | ICD-10-CM | POA: Diagnosis not present

## 2017-06-15 LAB — BAYER DCA HB A1C WAIVED: HB A1C (BAYER DCA - WAIVED): 6.6 % (ref <7.0)

## 2017-06-15 MED ORDER — ATENOLOL 25 MG PO TABS: 25.0000 mg | ORAL_TABLET | Freq: Every day | ORAL | 1 refills | Status: DC

## 2017-06-15 NOTE — Progress Notes (Signed)
  Subjective:   Patient ID: SHANEIL YAZDI, female    DOB: [birth year], 66 y.o.   MRN: [id number] CC: Follow-up  HPI: KAROLYNE TIMMONS is a 66 y.o. female presenting for Follow-up  DM2: taking 15u lantus at night  Itching a lot all the time Appetite has been good  Having some LUQ pain intermittently Has normal   Anxiety: well controlled with current meds  Has had a pain with deep breaths that comes and goes L side for past few months No fevers No coughing Not there all the time Having regular stools  HTN: no CP, no SOB No lightheadedness  Relevant past medical, surgical, family and social history reviewed. Allergies and medications reviewed and updated. History  Smoking Status  . Former Smoker  . Packs/day: 1.00  . Years: 30.00  . Quit date: 12/04/2001  Smokeless Tobacco  . Never Used   ROS: Per HPI   Objective:    BP (!) 139/59   Pulse (!) 52   Temp 98.1 F (36.7 C) (Oral)   Ht 5' 3.75" (1.619 m)   Wt 167 lb 12.8 oz (76.1 kg)   BMI 29.03 kg/m   Wt Readings from Last 3 Encounters:  06/15/17 167 lb 12.8 oz (76.1 kg)  03/16/17 166 lb (75.3 kg)  12/13/16 169 lb 6.4 oz (76.8 kg)    Gen: NAD, alert, cooperative with exam, NCAT EYES: EOMI, no conjunctival injection, or no icterus ENT:   OP without erythema LYMPH: no cervical LAD CV: NRRR, normal S1/S2, no murmur, distal pulses 2+ b/l Resp: CTABL, no wheezes, normal WOB Abd: +BS, soft, NTND. no guarding or organomegaly Ext: No edema, warm Neuro: Alert and oriented, strength equal b/l UE and LE, coordination grossly normal MSK: no ttp L side of chest wall along ribs  Assessment & Plan:  Maite was seen today for follow-up multiple med problems.  Diagnoses and all orders for this visit:  Pruritus Likely due to ESRD Will check labs -     CMP14+EGFR -     CBC with Differential/Platelet  Type 2 diabetes mellitus with stage 4 chronic kidney disease, with long-term current use of insulin (HCC) BGLs low 100s  in the morning No lows Taking 15u of insulin at night Decrease to 10unites Repeat A1c -     Bayer DCA Hb A1c Waived  Essential hypertension HR in low 50s, will decrease to 63m atenolol Cont to check BPs at home Let me know if stays elevated -     atenolol (TENORMIN) 25 MG tablet; Take 1 tablet (25 mg total) by mouth daily.  Left-sided chest wall pain Offered CXR today, pt wants to wait until medicare starts in 1 mo  Follow up plan: Return in about 4 weeks (around 07/13/2017) for med follow up. CAssunta Found MD [place]

## 2017-06-16 ENCOUNTER — Telehealth: Payer: Self-pay | Admitting: Family Medicine

## 2017-06-16 LAB — CMP14+EGFR
ALT: 27 IU/L (ref 0–32)
AST: 22 IU/L (ref 0–40)
Albumin/Globulin Ratio: 2 (ref 1.2–2.2)
Albumin: 4.4 g/dL (ref 3.6–4.8)
Alkaline Phosphatase: 112 IU/L (ref 39–117)
BUN/Creatinine Ratio: 7 — ABNORMAL LOW (ref 12–28)
BUN: 42 mg/dL — ABNORMAL HIGH (ref 8–27)
Bilirubin Total: 0.3 mg/dL (ref 0.0–1.2)
CO2: 29 mmol/L (ref 20–29)
Calcium: 9.6 mg/dL (ref 8.7–10.3)
Chloride: 96 mmol/L (ref 96–106)
Creatinine, Ser: 6.17 mg/dL (ref 0.57–1.00)
GFR calc Af Amer: 8 mL/min/{1.73_m2} — ABNORMAL LOW (ref >59)
GFR calc non Af Amer: 7 mL/min/{1.73_m2} — ABNORMAL LOW (ref >59)
Globulin, Total: 2.2 g/dL (ref 1.5–4.5)
Glucose: 101 mg/dL — ABNORMAL HIGH (ref 65–99)
Potassium: 4.3 mmol/L (ref 3.5–5.2)
Sodium: 142 mmol/L (ref 134–144)
Total Protein: 6.6 g/dL (ref 6.0–8.5)

## 2017-06-16 LAB — CBC WITH DIFFERENTIAL/PLATELET
Basophils Absolute: 0 10*3/uL (ref 0.0–0.2)
Basos: 0 %
EOS (ABSOLUTE): 0.5 10*3/uL — ABNORMAL HIGH (ref 0.0–0.4)
Eos: 5 %
Hematocrit: 38 % (ref 34.0–46.6)
Hemoglobin: 12.6 g/dL (ref 11.1–15.9)
Immature Grans (Abs): 0 10*3/uL (ref 0.0–0.1)
Immature Granulocytes: 0 %
Lymphocytes Absolute: 3.2 10*3/uL — ABNORMAL HIGH (ref 0.7–3.1)
Lymphs: 33 %
MCH: 28.1 pg (ref 26.6–33.0)
MCHC: 33.2 g/dL (ref 31.5–35.7)
MCV: 85 fL (ref 79–97)
Monocytes Absolute: 0.7 10*3/uL (ref 0.1–0.9)
Monocytes: 7 %
Neutrophils Absolute: 5.3 10*3/uL (ref 1.4–7.0)
Neutrophils: 55 %
Platelets: 248 10*3/uL (ref 150–379)
RBC: 4.49 x10E6/uL (ref 3.77–5.28)
RDW: 15.6 % — ABNORMAL HIGH (ref 12.3–15.4)
WBC: 9.6 10*3/uL (ref 3.4–10.8)

## 2017-06-16 NOTE — Telephone Encounter (Signed)
Documentation for critical lab, creatinine.  Patient is on the transplant list, on Tuesday/Thursday/Saturday hemodialysis for one year.  No acidosis, potassium normal. BUN and is elevated, likely causing pruritus  No need for call.   Laroy Apple, MD [facility] 06/16/2017, 9:03 AM

## 2017-06-21 ENCOUNTER — Telehealth: Payer: Self-pay | Admitting: *Deleted

## 2017-06-21 NOTE — Telephone Encounter (Signed)
Per pt, since decreasing Atenolol to 25mg  qd, when on dialysis pt states she is having headache and chest discomfort Pt wants to resume previous dose Please advise

## 2017-06-25 NOTE — Telephone Encounter (Signed)
That's fine, go back to 50mg  daily

## 2017-06-25 NOTE — Telephone Encounter (Signed)
Pt aware.

## 2017-07-05 DIAGNOSIS — Z23 Encounter for immunization: Secondary | ICD-10-CM | POA: Diagnosis not present

## 2017-07-05 DIAGNOSIS — N2581 Secondary hyperparathyroidism of renal origin: Secondary | ICD-10-CM | POA: Diagnosis not present

## 2017-07-05 DIAGNOSIS — D509 Iron deficiency anemia, unspecified: Secondary | ICD-10-CM | POA: Diagnosis not present

## 2017-07-05 DIAGNOSIS — N186 End stage renal disease: Secondary | ICD-10-CM | POA: Diagnosis not present

## 2017-07-05 DIAGNOSIS — Z992 Dependence on renal dialysis: Secondary | ICD-10-CM | POA: Diagnosis not present

## 2017-07-07 DIAGNOSIS — N186 End stage renal disease: Secondary | ICD-10-CM | POA: Diagnosis not present

## 2017-07-07 DIAGNOSIS — Z992 Dependence on renal dialysis: Secondary | ICD-10-CM | POA: Diagnosis not present

## 2017-07-07 DIAGNOSIS — N2581 Secondary hyperparathyroidism of renal origin: Secondary | ICD-10-CM | POA: Diagnosis not present

## 2017-07-07 DIAGNOSIS — D509 Iron deficiency anemia, unspecified: Secondary | ICD-10-CM | POA: Diagnosis not present

## 2017-07-07 DIAGNOSIS — Z23 Encounter for immunization: Secondary | ICD-10-CM | POA: Diagnosis not present

## 2017-07-10 DIAGNOSIS — Z992 Dependence on renal dialysis: Secondary | ICD-10-CM | POA: Diagnosis not present

## 2017-07-10 DIAGNOSIS — Z23 Encounter for immunization: Secondary | ICD-10-CM | POA: Diagnosis not present

## 2017-07-10 DIAGNOSIS — N186 End stage renal disease: Secondary | ICD-10-CM | POA: Diagnosis not present

## 2017-07-10 DIAGNOSIS — N2581 Secondary hyperparathyroidism of renal origin: Secondary | ICD-10-CM | POA: Diagnosis not present

## 2017-07-10 DIAGNOSIS — D509 Iron deficiency anemia, unspecified: Secondary | ICD-10-CM | POA: Diagnosis not present

## 2017-07-11 ENCOUNTER — Ambulatory Visit (INDEPENDENT_AMBULATORY_CARE_PROVIDER_SITE_OTHER): Payer: Medicare Other | Admitting: Pediatrics

## 2017-07-11 ENCOUNTER — Encounter: Payer: Self-pay | Admitting: Pediatrics

## 2017-07-11 ENCOUNTER — Ambulatory Visit (INDEPENDENT_AMBULATORY_CARE_PROVIDER_SITE_OTHER): Payer: Medicare Other

## 2017-07-11 VITALS — BP 129/65 | HR 61 | Temp 98.0°F | Ht 63.75 in | Wt 168.0 lb

## 2017-07-11 DIAGNOSIS — R109 Unspecified abdominal pain: Secondary | ICD-10-CM | POA: Diagnosis not present

## 2017-07-11 DIAGNOSIS — I10 Essential (primary) hypertension: Secondary | ICD-10-CM | POA: Diagnosis not present

## 2017-07-11 DIAGNOSIS — L299 Pruritus, unspecified: Secondary | ICD-10-CM

## 2017-07-11 DIAGNOSIS — Z794 Long term (current) use of insulin: Secondary | ICD-10-CM | POA: Diagnosis not present

## 2017-07-11 DIAGNOSIS — E1122 Type 2 diabetes mellitus with diabetic chronic kidney disease: Secondary | ICD-10-CM | POA: Diagnosis not present

## 2017-07-11 DIAGNOSIS — N281 Cyst of kidney, acquired: Secondary | ICD-10-CM

## 2017-07-11 DIAGNOSIS — I25119 Atherosclerotic heart disease of native coronary artery with unspecified angina pectoris: Secondary | ICD-10-CM

## 2017-07-11 DIAGNOSIS — N184 Chronic kidney disease, stage 4 (severe): Secondary | ICD-10-CM | POA: Diagnosis not present

## 2017-07-11 NOTE — Progress Notes (Signed)
  Subjective:   Patient ID: [name], female    DOB: 1951-06-10, 66 y.o.   MRN: [id number] CC: Follow-up (4 week)  HPI: [name] is a 66 y.o. female presenting for Follow-up (4 week)  Still with L sided pain at times, lower L side Taking 15u of insulin at night AM BGLs 100-113-124 last few days Tried decreasing to 10u, BGLs went up to mid 100s in am  HTN: BP went up with 25mg  of atenolol BP has been better since increasing back to 50mg   Continues to have L sided pain There all the time Sometimes worse, such as when rolling over Has some constipation, tries to take something for it Appetite has been fine No fevers H/o complex renal cyst, was getting regular u/s for it to follow, none for past 2 years   Relevant past medical, surgical, family and social history reviewed. Allergies and medications reviewed and updated. History  Smoking Status  . Former Smoker  . Packs/day: 1.00  . Years: 30.00  . Quit date: 12/04/2001  Smokeless Tobacco  . Never Used   ROS: Per HPI   Objective:    BP 129/65   Pulse 61   Temp 98 F (36.7 C) (Oral)   Ht 5' 3.75" (1.619 m)   Wt 168 lb (76.2 kg)   BMI 29.06 kg/m   Wt Readings from Last 3 Encounters:  07/11/17 168 lb (76.2 kg)  06/15/17 167 lb 12.8 oz (76.1 kg)  03/16/17 166 lb (75.3 kg)    Gen: NAD, alert, cooperative with exam, NCAT EYES: EOMI, no conjunctival injection, or no icterus ENT:  OP without erythema CV: NRRR, normal S1/S2, no murmur, distal pulses 2+ b/l Resp: CTABL, no wheezes, normal WOB Abd: +BS, soft, mildly tender with deep palpation LUQ, no masses palpated, no CVA tenderness, no guarding or organomegaly Ext: No edema, warm Neuro: Alert and oriented MSK: normal muscle bulk  Assessment & Plan:  Elysa was seen today for follow-up multiple med problems  Diagnoses and all orders for this visit:  Left sided abdominal pain Will get CXR, if xray and u/s unrevealing may need ct scan Ongoing over 4  weeks, daily pain, sometimes worse than others -     DG Chest 2 View; Future  Renal cyst Follow up with u/s -     US Renal; Future  Type 2 diabetes mellitus with stage 4 chronic kidney disease, with long-term current use of insulin (HCC) Cont insulin Any BGLs less than 100 let me know  Pruritus Improved  Essential hypertension Adequate control, cont atenolol 50mg  daily  Follow up plan: Return in about 3 months (around 10/11/2017). [name], MD [place]

## 2017-07-12 ENCOUNTER — Other Ambulatory Visit: Payer: Self-pay | Admitting: Pediatrics

## 2017-07-12 DIAGNOSIS — N2581 Secondary hyperparathyroidism of renal origin: Secondary | ICD-10-CM | POA: Diagnosis not present

## 2017-07-12 DIAGNOSIS — N186 End stage renal disease: Secondary | ICD-10-CM | POA: Diagnosis not present

## 2017-07-12 DIAGNOSIS — R109 Unspecified abdominal pain: Secondary | ICD-10-CM

## 2017-07-12 DIAGNOSIS — Z23 Encounter for immunization: Secondary | ICD-10-CM | POA: Diagnosis not present

## 2017-07-12 DIAGNOSIS — D509 Iron deficiency anemia, unspecified: Secondary | ICD-10-CM | POA: Diagnosis not present

## 2017-07-12 DIAGNOSIS — Z992 Dependence on renal dialysis: Secondary | ICD-10-CM | POA: Diagnosis not present

## 2017-07-13 ENCOUNTER — Ambulatory Visit: Payer: Medicare Other | Admitting: Pediatrics

## 2017-07-14 DIAGNOSIS — N186 End stage renal disease: Secondary | ICD-10-CM | POA: Diagnosis not present

## 2017-07-14 DIAGNOSIS — Z23 Encounter for immunization: Secondary | ICD-10-CM | POA: Diagnosis not present

## 2017-07-14 DIAGNOSIS — D509 Iron deficiency anemia, unspecified: Secondary | ICD-10-CM | POA: Diagnosis not present

## 2017-07-14 DIAGNOSIS — N2581 Secondary hyperparathyroidism of renal origin: Secondary | ICD-10-CM | POA: Diagnosis not present

## 2017-07-14 DIAGNOSIS — Z992 Dependence on renal dialysis: Secondary | ICD-10-CM | POA: Diagnosis not present

## 2017-07-17 DIAGNOSIS — N186 End stage renal disease: Secondary | ICD-10-CM | POA: Diagnosis not present

## 2017-07-17 DIAGNOSIS — N2581 Secondary hyperparathyroidism of renal origin: Secondary | ICD-10-CM | POA: Diagnosis not present

## 2017-07-17 DIAGNOSIS — D509 Iron deficiency anemia, unspecified: Secondary | ICD-10-CM | POA: Diagnosis not present

## 2017-07-17 DIAGNOSIS — Z992 Dependence on renal dialysis: Secondary | ICD-10-CM | POA: Diagnosis not present

## 2017-07-17 DIAGNOSIS — Z23 Encounter for immunization: Secondary | ICD-10-CM | POA: Diagnosis not present

## 2017-07-18 ENCOUNTER — Ambulatory Visit (HOSPITAL_COMMUNITY)
Admission: RE | Admit: 2017-07-18 | Discharge: 2017-07-18 | Disposition: A | Discharge status: Home / Self Care | Payer: Medicare Other | Source: Ambulatory Visit | Attending: Pediatrics | Admitting: Pediatrics

## 2017-07-18 DIAGNOSIS — N186 End stage renal disease: Secondary | ICD-10-CM | POA: Diagnosis not present

## 2017-07-18 DIAGNOSIS — N281 Cyst of kidney, acquired: Secondary | ICD-10-CM | POA: Insufficient documentation

## 2017-07-18 DIAGNOSIS — N261 Atrophy of kidney (terminal): Secondary | ICD-10-CM | POA: Insufficient documentation

## 2017-07-19 ENCOUNTER — Ambulatory Visit (HOSPITAL_COMMUNITY): Payer: Medicare Other

## 2017-07-19 DIAGNOSIS — D509 Iron deficiency anemia, unspecified: Secondary | ICD-10-CM | POA: Diagnosis not present

## 2017-07-19 DIAGNOSIS — N186 End stage renal disease: Secondary | ICD-10-CM | POA: Diagnosis not present

## 2017-07-19 DIAGNOSIS — Z992 Dependence on renal dialysis: Secondary | ICD-10-CM | POA: Diagnosis not present

## 2017-07-19 DIAGNOSIS — Z23 Encounter for immunization: Secondary | ICD-10-CM | POA: Diagnosis not present

## 2017-07-19 DIAGNOSIS — N2581 Secondary hyperparathyroidism of renal origin: Secondary | ICD-10-CM | POA: Diagnosis not present

## 2017-07-21 DIAGNOSIS — N2581 Secondary hyperparathyroidism of renal origin: Secondary | ICD-10-CM | POA: Diagnosis not present

## 2017-07-21 DIAGNOSIS — D509 Iron deficiency anemia, unspecified: Secondary | ICD-10-CM | POA: Diagnosis not present

## 2017-07-21 DIAGNOSIS — N186 End stage renal disease: Secondary | ICD-10-CM | POA: Diagnosis not present

## 2017-07-21 DIAGNOSIS — Z992 Dependence on renal dialysis: Secondary | ICD-10-CM | POA: Diagnosis not present

## 2017-07-21 DIAGNOSIS — Z23 Encounter for immunization: Secondary | ICD-10-CM | POA: Diagnosis not present

## 2017-07-23 ENCOUNTER — Encounter: Payer: Self-pay | Admitting: Pediatrics

## 2017-07-23 ENCOUNTER — Ambulatory Visit (INDEPENDENT_AMBULATORY_CARE_PROVIDER_SITE_OTHER): Payer: Medicare Other | Admitting: Pediatrics

## 2017-07-23 ENCOUNTER — Ambulatory Visit (INDEPENDENT_AMBULATORY_CARE_PROVIDER_SITE_OTHER): Payer: Medicare Other

## 2017-07-23 VITALS — BP 133/61 | HR 53 | Temp 97.9°F | Ht 63.75 in | Wt 167.6 lb

## 2017-07-23 DIAGNOSIS — Z780 Asymptomatic menopausal state: Secondary | ICD-10-CM

## 2017-07-23 DIAGNOSIS — I25119 Atherosclerotic heart disease of native coronary artery with unspecified angina pectoris: Secondary | ICD-10-CM | POA: Diagnosis not present

## 2017-07-23 DIAGNOSIS — K5900 Constipation, unspecified: Secondary | ICD-10-CM | POA: Diagnosis not present

## 2017-07-23 DIAGNOSIS — N750 Cyst of Bartholin's gland: Secondary | ICD-10-CM

## 2017-07-23 MED ORDER — DOCUSATE SODIUM 100 MG PO CAPS: 100.0000 mg | ORAL_CAPSULE | Freq: Two times a day (BID) | ORAL | 2 refills | Status: DC

## 2017-07-23 NOTE — Patient Instructions (Signed)
Let me know if knot returns, I will get you to see a gynecologist

## 2017-07-23 NOTE — Progress Notes (Signed)
  Subjective:   Patient ID: [name], female    DOB: Oct 15, 1951, 66 y.o.   MRN: [id number] CC: Cyst (Vaginal area)  HPI: [name] is a 66 y.o. female presenting for Cyst (Vaginal area)  Has a cyst in vaginal area Says has been there for months hasnt paid attention to it bc not bothering her Now is bigger, notices it more When tried to find it today says it has gotten smaller again No tenderness No itching No vaginal discharge  Due for mammogram in Dec   Relevant past medical, surgical, family and social history reviewed. Allergies and medications reviewed and updated. History  Smoking Status  . Former Smoker  . Packs/day: 1.00  . Years: 30.00  . Quit date: 12/04/2001  Smokeless Tobacco  . Never Used   ROS: Per HPI   Objective:    BP 133/61   Pulse (!) 53   Temp 97.9 F (36.6 C) (Oral)   Ht 5' 3.75" (1.619 m)   Wt 167 lb 9.6 oz (76 kg)   BMI 28.99 kg/m   Wt Readings from Last 3 Encounters:  07/23/17 167 lb 9.6 oz (76 kg)  07/11/17 168 lb (76.2 kg)  06/15/17 167 lb 12.8 oz (76.1 kg)    Gen: NAD, alert, cooperative with exam, NCAT EYES: EOMI, no conjunctival injection, or no icterus CV: NRRR, normal S1/S2, no murmur, distal pulses 2+ b/l Resp: CTABL, no wheezes, normal WOB Abd: +BS, soft, NTND. no guarding or organomegaly Ext: No edema, warm Neuro: Alert and oriented, strength equal b/l UE and LE, coordination grossly normal MSK: normal muscle bulk GU: normal external exam, with palpation, slight fullness base of L labia compared with R. No skin lesions.  Assessment & Plan:  [name] was seen today for cyst.  Diagnoses and all orders for this visit:  Constipation, unspecified constipation type Ongoing symptoms, improved with below, start taking daily -     docusate sodium (COLACE) 100 MG capsule; Take 1 capsule (100 mg total) by mouth 2 (two) times daily.  Bartholin cyst If not improving or for any worsening symptoms let me know No signs of  infection Not tender today No lesions on skin Refer to gyn if symptoms return  Follow up plan: Return in about 3 months (around 10/23/2017). [name], MD [place]

## 2017-07-24 DIAGNOSIS — D509 Iron deficiency anemia, unspecified: Secondary | ICD-10-CM | POA: Diagnosis not present

## 2017-07-24 DIAGNOSIS — Z23 Encounter for immunization: Secondary | ICD-10-CM | POA: Diagnosis not present

## 2017-07-24 DIAGNOSIS — N2581 Secondary hyperparathyroidism of renal origin: Secondary | ICD-10-CM | POA: Diagnosis not present

## 2017-07-24 DIAGNOSIS — N186 End stage renal disease: Secondary | ICD-10-CM | POA: Diagnosis not present

## 2017-07-24 DIAGNOSIS — Z992 Dependence on renal dialysis: Secondary | ICD-10-CM | POA: Diagnosis not present

## 2017-07-26 DIAGNOSIS — D509 Iron deficiency anemia, unspecified: Secondary | ICD-10-CM | POA: Diagnosis not present

## 2017-07-26 DIAGNOSIS — N186 End stage renal disease: Secondary | ICD-10-CM | POA: Diagnosis not present

## 2017-07-26 DIAGNOSIS — Z992 Dependence on renal dialysis: Secondary | ICD-10-CM | POA: Diagnosis not present

## 2017-07-26 DIAGNOSIS — N2581 Secondary hyperparathyroidism of renal origin: Secondary | ICD-10-CM | POA: Diagnosis not present

## 2017-07-26 DIAGNOSIS — Z23 Encounter for immunization: Secondary | ICD-10-CM | POA: Diagnosis not present

## 2017-07-28 DIAGNOSIS — Z992 Dependence on renal dialysis: Secondary | ICD-10-CM | POA: Diagnosis not present

## 2017-07-28 DIAGNOSIS — D509 Iron deficiency anemia, unspecified: Secondary | ICD-10-CM | POA: Diagnosis not present

## 2017-07-28 DIAGNOSIS — N186 End stage renal disease: Secondary | ICD-10-CM | POA: Diagnosis not present

## 2017-07-28 DIAGNOSIS — Z23 Encounter for immunization: Secondary | ICD-10-CM | POA: Diagnosis not present

## 2017-07-28 DIAGNOSIS — N2581 Secondary hyperparathyroidism of renal origin: Secondary | ICD-10-CM | POA: Diagnosis not present

## 2017-07-31 DIAGNOSIS — D509 Iron deficiency anemia, unspecified: Secondary | ICD-10-CM | POA: Diagnosis not present

## 2017-07-31 DIAGNOSIS — N2581 Secondary hyperparathyroidism of renal origin: Secondary | ICD-10-CM | POA: Diagnosis not present

## 2017-07-31 DIAGNOSIS — Z23 Encounter for immunization: Secondary | ICD-10-CM | POA: Diagnosis not present

## 2017-07-31 DIAGNOSIS — Z992 Dependence on renal dialysis: Secondary | ICD-10-CM | POA: Diagnosis not present

## 2017-07-31 DIAGNOSIS — N186 End stage renal disease: Secondary | ICD-10-CM | POA: Diagnosis not present

## 2017-08-02 ENCOUNTER — Other Ambulatory Visit: Payer: Self-pay

## 2017-08-02 DIAGNOSIS — D509 Iron deficiency anemia, unspecified: Secondary | ICD-10-CM | POA: Diagnosis not present

## 2017-08-02 DIAGNOSIS — Z992 Dependence on renal dialysis: Secondary | ICD-10-CM | POA: Diagnosis not present

## 2017-08-02 DIAGNOSIS — N186 End stage renal disease: Secondary | ICD-10-CM | POA: Diagnosis not present

## 2017-08-02 DIAGNOSIS — Z23 Encounter for immunization: Secondary | ICD-10-CM | POA: Diagnosis not present

## 2017-08-02 DIAGNOSIS — N2581 Secondary hyperparathyroidism of renal origin: Secondary | ICD-10-CM | POA: Diagnosis not present

## 2017-08-04 DIAGNOSIS — D509 Iron deficiency anemia, unspecified: Secondary | ICD-10-CM | POA: Diagnosis not present

## 2017-08-04 DIAGNOSIS — N186 End stage renal disease: Secondary | ICD-10-CM | POA: Diagnosis not present

## 2017-08-04 DIAGNOSIS — Z992 Dependence on renal dialysis: Secondary | ICD-10-CM | POA: Diagnosis not present

## 2017-08-04 DIAGNOSIS — D631 Anemia in chronic kidney disease: Secondary | ICD-10-CM | POA: Diagnosis not present

## 2017-08-04 DIAGNOSIS — N2581 Secondary hyperparathyroidism of renal origin: Secondary | ICD-10-CM | POA: Diagnosis not present

## 2017-08-07 DIAGNOSIS — D631 Anemia in chronic kidney disease: Secondary | ICD-10-CM | POA: Diagnosis not present

## 2017-08-07 DIAGNOSIS — D509 Iron deficiency anemia, unspecified: Secondary | ICD-10-CM | POA: Diagnosis not present

## 2017-08-07 DIAGNOSIS — Z992 Dependence on renal dialysis: Secondary | ICD-10-CM | POA: Diagnosis not present

## 2017-08-07 DIAGNOSIS — N186 End stage renal disease: Secondary | ICD-10-CM | POA: Diagnosis not present

## 2017-08-07 DIAGNOSIS — N2581 Secondary hyperparathyroidism of renal origin: Secondary | ICD-10-CM | POA: Diagnosis not present

## 2017-08-09 DIAGNOSIS — D509 Iron deficiency anemia, unspecified: Secondary | ICD-10-CM | POA: Diagnosis not present

## 2017-08-09 DIAGNOSIS — N186 End stage renal disease: Secondary | ICD-10-CM | POA: Diagnosis not present

## 2017-08-09 DIAGNOSIS — Z992 Dependence on renal dialysis: Secondary | ICD-10-CM | POA: Diagnosis not present

## 2017-08-09 DIAGNOSIS — D631 Anemia in chronic kidney disease: Secondary | ICD-10-CM | POA: Diagnosis not present

## 2017-08-09 DIAGNOSIS — N2581 Secondary hyperparathyroidism of renal origin: Secondary | ICD-10-CM | POA: Diagnosis not present

## 2017-08-10 ENCOUNTER — Encounter (HOSPITAL_COMMUNITY): Payer: Self-pay | Admitting: Vascular Surgery

## 2017-08-10 ENCOUNTER — Ambulatory Visit (HOSPITAL_COMMUNITY)
Admission: RE | Admit: 2017-08-10 | Discharge: 2017-08-10 | Disposition: A | Discharge status: Home / Self Care | Payer: Medicare Other | Source: Ambulatory Visit | Attending: Vascular Surgery | Admitting: Vascular Surgery

## 2017-08-10 ENCOUNTER — Encounter (HOSPITAL_COMMUNITY)
Admission: RE | Disposition: A | Discharge status: Home / Self Care | Payer: Self-pay | Source: Ambulatory Visit | Attending: Vascular Surgery

## 2017-08-10 DIAGNOSIS — I2510 Atherosclerotic heart disease of native coronary artery without angina pectoris: Secondary | ICD-10-CM | POA: Insufficient documentation

## 2017-08-10 DIAGNOSIS — I5032 Chronic diastolic (congestive) heart failure: Secondary | ICD-10-CM | POA: Diagnosis not present

## 2017-08-10 DIAGNOSIS — Z880 Allergy status to penicillin: Secondary | ICD-10-CM | POA: Diagnosis not present

## 2017-08-10 DIAGNOSIS — E1122 Type 2 diabetes mellitus with diabetic chronic kidney disease: Secondary | ICD-10-CM | POA: Insufficient documentation

## 2017-08-10 DIAGNOSIS — Y832 Surgical operation with anastomosis, bypass or graft as the cause of abnormal reaction of the patient, or of later complication, without mention of misadventure at the time of the procedure: Secondary | ICD-10-CM | POA: Insufficient documentation

## 2017-08-10 DIAGNOSIS — N189 Chronic kidney disease, unspecified: Secondary | ICD-10-CM | POA: Insufficient documentation

## 2017-08-10 DIAGNOSIS — I130 Hypertensive heart and chronic kidney disease with heart failure and stage 1 through stage 4 chronic kidney disease, or unspecified chronic kidney disease: Secondary | ICD-10-CM | POA: Diagnosis not present

## 2017-08-10 DIAGNOSIS — Z8673 Personal history of transient ischemic attack (TIA), and cerebral infarction without residual deficits: Secondary | ICD-10-CM | POA: Insufficient documentation

## 2017-08-10 DIAGNOSIS — E785 Hyperlipidemia, unspecified: Secondary | ICD-10-CM | POA: Insufficient documentation

## 2017-08-10 DIAGNOSIS — N184 Chronic kidney disease, stage 4 (severe): Secondary | ICD-10-CM | POA: Diagnosis not present

## 2017-08-10 DIAGNOSIS — E1140 Type 2 diabetes mellitus with diabetic neuropathy, unspecified: Secondary | ICD-10-CM | POA: Diagnosis not present

## 2017-08-10 DIAGNOSIS — Z87891 Personal history of nicotine dependence: Secondary | ICD-10-CM | POA: Insufficient documentation

## 2017-08-10 DIAGNOSIS — Z794 Long term (current) use of insulin: Secondary | ICD-10-CM | POA: Insufficient documentation

## 2017-08-10 DIAGNOSIS — J449 Chronic obstructive pulmonary disease, unspecified: Secondary | ICD-10-CM | POA: Insufficient documentation

## 2017-08-10 DIAGNOSIS — T82838A Hemorrhage of vascular prosthetic devices, implants and grafts, initial encounter: Secondary | ICD-10-CM | POA: Diagnosis not present

## 2017-08-10 DIAGNOSIS — E1151 Type 2 diabetes mellitus with diabetic peripheral angiopathy without gangrene: Secondary | ICD-10-CM | POA: Diagnosis not present

## 2017-08-10 DIAGNOSIS — T82898A Other specified complication of vascular prosthetic devices, implants and grafts, initial encounter: Secondary | ICD-10-CM | POA: Diagnosis not present

## 2017-08-10 HISTORY — PX: A/V FISTULAGRAM: CATH118298

## 2017-08-10 HISTORY — PX: PERIPHERAL VASCULAR INTERVENTION: CATH118257

## 2017-08-10 LAB — POCT I-STAT, CHEM 8
BUN: 46 mg/dL — ABNORMAL HIGH (ref 6–20)
Calcium, Ion: 1.12 mmol/L — ABNORMAL LOW (ref 1.15–1.40)
Chloride: 97 mmol/L — ABNORMAL LOW (ref 101–111)
Creatinine, Ser: 7.5 mg/dL — ABNORMAL HIGH (ref 0.44–1.00)
Glucose, Bld: 142 mg/dL — ABNORMAL HIGH (ref 65–99)
HCT: 36 % (ref 36.0–46.0)
Hemoglobin: 12.2 g/dL (ref 12.0–15.0)
Potassium: 4 mmol/L (ref 3.5–5.1)
Sodium: 139 mmol/L (ref 135–145)
TCO2: 32 mmol/L (ref 22–32)

## 2017-08-10 LAB — GLUCOSE, CAPILLARY: Glucose-Capillary: 137 mg/dL — ABNORMAL HIGH (ref 65–99)

## 2017-08-10 SURGERY — A/V FISTULAGRAM
Anesthesia: LOCAL

## 2017-08-10 MED ORDER — SODIUM CHLORIDE 0.9% FLUSH: 3.0000 mL | Freq: Two times a day (BID) | INTRAVENOUS | Status: DC

## 2017-08-10 MED ORDER — HEPARIN (PORCINE) IN NACL 2-0.9 UNIT/ML-% IJ SOLN
INTRAMUSCULAR | Status: AC
  Filled 2017-08-10: qty 500

## 2017-08-10 MED ORDER — LIDOCAINE HCL (PF) 1 % IJ SOLN
INTRAMUSCULAR | Status: AC
  Filled 2017-08-10: qty 30

## 2017-08-10 MED ORDER — OXYCODONE HCL 5 MG PO TABS: 5.0000 mg | ORAL_TABLET | ORAL | Status: DC | PRN

## 2017-08-10 MED ORDER — LIDOCAINE HCL (PF) 1 % IJ SOLN
INTRAMUSCULAR | Status: DC | PRN
  Administered 2017-08-10: 1 mL

## 2017-08-10 MED ORDER — HEPARIN SODIUM (PORCINE) 1000 UNIT/ML IJ SOLN
INTRAMUSCULAR | Status: AC
  Filled 2017-08-10: qty 1

## 2017-08-10 MED ORDER — HEPARIN SODIUM (PORCINE) 1000 UNIT/ML IJ SOLN
INTRAMUSCULAR | Status: DC | PRN
  Administered 2017-08-10: 3000 [IU] via INTRAVENOUS

## 2017-08-10 MED ORDER — LABETALOL HCL 5 MG/ML IV SOLN: 10.0000 mg | INTRAVENOUS | Status: DC | PRN

## 2017-08-10 MED ORDER — SODIUM CHLORIDE 0.9% FLUSH: 3.0000 mL | INTRAVENOUS | Status: DC | PRN

## 2017-08-10 MED ORDER — IODIXANOL 320 MG/ML IV SOLN
INTRAVENOUS | Status: DC | PRN
  Administered 2017-08-10: 100 mL

## 2017-08-10 MED ORDER — HYDRALAZINE HCL 20 MG/ML IJ SOLN: 5.0000 mg | INTRAMUSCULAR | Status: DC | PRN

## 2017-08-10 MED ORDER — HEPARIN (PORCINE) IN NACL 2-0.9 UNIT/ML-% IJ SOLN
INTRAMUSCULAR | Status: AC | PRN
  Administered 2017-08-10: 500 mL

## 2017-08-10 SURGICAL SUPPLY — 18 items
BALLN MUSTANG 10X20X75 (BALLOONS) ×3
BALLN MUSTANG 4X60X75 (BALLOONS) ×3
BALLN MUSTANG 6.0X40 75 (BALLOONS) ×3
BALLN MUSTANG 8.0X40 75 (BALLOONS) ×3
BALLOON MUSTANG 10X20X75 (BALLOONS) IMPLANT
BALLOON MUSTANG 4X60X75 (BALLOONS) IMPLANT
BALLOON MUSTANG 6.0X40 75 (BALLOONS) IMPLANT
BALLOON MUSTANG 8.0X40 75 (BALLOONS) IMPLANT
COVER PRB 48X5XTLSCP FOLD TPE (BAG) ×2 IMPLANT
COVER PROBE 5X48 (BAG) ×3
KIT ENCORE 26 ADVANTAGE (KITS) ×1 IMPLANT
KIT MICROINTRODUCER STIFF 5F (SHEATH) ×1 IMPLANT
KIT PV (KITS) ×1 IMPLANT
SHEATH PINNACLE R/O II 7F 4CM (SHEATH) ×1 IMPLANT
STOPCOCK MORSE 400PSI 3WAY (MISCELLANEOUS) ×3 IMPLANT
TRAY PV CATH (CUSTOM PROCEDURE TRAY) ×3 IMPLANT
TUBING CIL FLEX 10 FLL-RA (TUBING) ×3 IMPLANT
WIRE ROSEN-J .035X180CM (WIRE) ×1 IMPLANT

## 2017-08-10 NOTE — H&P (Signed)
[facility] SHORT STAY H&P  Referring [place]    HPI: fistula with prolonged bleeding.  Prior distal narrowing  Past Medical History:  Diagnosis Date  . Acute on chronic diastolic CHF (congestive heart failure) ([place]) 03/05/2015   Grade 2. EF 60-65%  . Anemia    hx of  . Anxiety   . Arthritis   . CAD (coronary artery disease)    s/p Stenting in 2005;  Axis 7/09: Vigorous LV function, 2-3+ MR, RI 40%, RI stent patent, proximal-mid RCA 40-50%;  Echo 7/09:  EF 55-65%, trivial MR;  Myoview 11/18/12: EF 66%, normal LV wall motion, mild to moderate anterior ischemia - reviewed by [facility] and felt to rep breast atten (low risk);  Echo 6/14: mild LVH, EF 55-65%, Gr 2 DD, PASP 44, trivial eff   . Chronic kidney disease    CREATININE IS UP--GOING TO KIDNEY MD   . Chronic neck pain   . COPD (chronic obstructive pulmonary disease) ([place])   . Degenerative joint disease    left shoulder  . Diabetic neuropathy ([place])   . Diverticulosis   . Esophageal stricture   . GERD (gastroesophageal reflux disease)   . Hyperlipidemia   . Hypertension   . IDDM (insulin dependent diabetes mellitus) ([place])   . Pericardial effusion 03/06/2015   Very small per echo ; pleural nodules seen on CT chest.  . Peripheral vascular disease ([place])   . Stroke [facility])    "mini- years ago"  . Vitamin D deficiency     FH:  Non-Contributory  Social HX Social History  Substance Use Topics  . Smoking status: Former Smoker    Packs/day: 1.00    Years: 30.00    Quit date: 12/04/2001  . Smokeless tobacco: Never Used  . Alcohol use No    Allergies Allergies  Allergen Reactions  . Ace Inhibitors Cough  . Clopidogrel Bisulfate Other (See Comments)    REACTION: Sick, Headache, "Felt terrible"  . Codeine Other (See Comments)    REACTION: hallucinations  . Lisinopril Other (See Comments)    cough  . Penicillins Other (See Comments)    Has patient had a PCN reaction causing immediate rash, facial/tongue/throat  swelling, SOB or lightheadedness with hypotension: unknown Has patient had a PCN reaction causing severe rash involving mucus membranes or skin necrosis: unknown Has patient had a PCN reaction that required hospitalization unknown Has patient had a PCN reaction occurring within the last 10 years:unknown If all of the above answers are "NO", then may proceed with Cephalosporin use.     Medications Current Facility-Administered Medications  Medication Dose Route Frequency Provider Last Rate Last Dose  . sodium chloride flush (NS) 0.9 % injection 3 mL  3 mL Intravenous PRN Elam Dutch, MD         PHYSICAL EXAM  Vitals:   08/10/17 0839  BP: (!) 152/57  Pulse: 64  Temp: 97.9 F (36.6 C)  SpO2: 98%    General:  WDWN in NAD HENT: WNL Eyes: Pupils equal Pulmonary: normal non-labored breathing , without Rales, rhonchi,  wheezing Cardiac: RRR, Vascular Exam/Pulses:  + thrill right arm AV fistula, narrow on palpation mid upper arm  Extremities without ischemic changes, no Gangrene , no cellulitis; no open wounds;  Neuro A&O x 3; good sensation; motion in all extremities  Impression: narrowed venous outflow right arm AV fistula  Plan: fistulogram intervention today. Risks benefits procedure discussed  Ruta Hinds @TODAY @ 10:53 AM

## 2017-08-10 NOTE — Op Note (Signed)
Procedure: Right arm fistulogram with angioplasty of mid and distal right brachiocephalic AV fistula  Preoperative diagnosis: Poorly functioning right arm AV fistula  Postoperative diagnosis: Same  Anesthesia: Local  Operative findings: #1 mid right brachiocephalic narrowing 20% angioplasty with residual 60% recoiled stenosis (ballooned to 10 mm) #2 distal right brachiocephalic fistula narrowing 70% 2 tandem lesions near the cephalic subclavian junction angioplasty to less than 15% residual stenosis (ballooned to 8 mm)  Operative details: After obtaining informed consent, patient taken to the [facility] lab. The patient was placed in supine position on the Angio table. Patient's right upper extremity was prepped and draped in usual sterile fashion. Local anesthesia was treated over the proximal aspect of the fistula near the antecubital crease. Micropuncture needle was used to access the fistula and a micropuncture needle wire and advanced into the fistula. Microchip sheath was then placed over this. An 035 Rosen wire was advanced through the micropuncture sheath all the way into the central venous circulation. There were 2 areas of apparent narrowing and I was able to advance a Rosen wire through these. The micropuncture sheath was then upsized to a 7 [place] short sheath. Contrast angiogram was then performed with images of the distal outflow as well as refluxing contrast across the inflow. The inflow was widely patent. There is a mid fistula narrowing of about 70%. There is some tortuosity also in this area. There are also 2 tandem lesions at the cephalic subclavian junction each about 70%. The patient was given 3000 units of intravenous heparin. A 6 x 40 angioplasty balloon was brought up on the field and advanced over the Rosen wire to the cephalic subclavian junction and inflated to nominal pressure for 1 minute. There was still some residual narrowing. Therefore an 8 x 40 Angio plasty balloon was then  advanced and centered over the same lesion. This again was inflated to nominal pressure for 1 minute. Completion angiogram showed less than 15% residual stenosis of the tandem lesions. Attention was then turned to the mid fistula lesion. Sequential inflations to nominal pressure of the 6 followed by the 8 followed by a 10 mm balloon were performed. There were still some recoil of the lesion. We may be improve this by about 15%. There is still about a 50-60% narrowing in this area. I did not feel ballooning this higher was going to improve results. If the patient has continued problems with the fistula with flow week in operative revision. At this point the guidewire was removed and the sheath removed and the hole closed with a single Monocryl stitch. Hemostasis was obtained with direct pressure. The patient tolerated the procedure well and there were no complications. The patient was taken to the holding area in stable condition.  Operative management: Successful angioplasty of cephalic subclavian junction would be amenable to additional angioplasty. If the patient has recurrent narrowing of the midportion of the fistula suggest surgical revision with patch angioplasty or excision and re-anastomosis.  Ruta Hinds, MD [facility] [place] Office: [phone] Pager: [phone]

## 2017-08-10 NOTE — Discharge Instructions (Signed)

## 2017-08-11 DIAGNOSIS — Z992 Dependence on renal dialysis: Secondary | ICD-10-CM | POA: Diagnosis not present

## 2017-08-11 DIAGNOSIS — D509 Iron deficiency anemia, unspecified: Secondary | ICD-10-CM | POA: Diagnosis not present

## 2017-08-11 DIAGNOSIS — N186 End stage renal disease: Secondary | ICD-10-CM | POA: Diagnosis not present

## 2017-08-11 DIAGNOSIS — D631 Anemia in chronic kidney disease: Secondary | ICD-10-CM | POA: Diagnosis not present

## 2017-08-11 DIAGNOSIS — N2581 Secondary hyperparathyroidism of renal origin: Secondary | ICD-10-CM | POA: Diagnosis not present

## 2017-08-14 DIAGNOSIS — D631 Anemia in chronic kidney disease: Secondary | ICD-10-CM | POA: Diagnosis not present

## 2017-08-14 DIAGNOSIS — Z992 Dependence on renal dialysis: Secondary | ICD-10-CM | POA: Diagnosis not present

## 2017-08-14 DIAGNOSIS — N2581 Secondary hyperparathyroidism of renal origin: Secondary | ICD-10-CM | POA: Diagnosis not present

## 2017-08-14 DIAGNOSIS — N186 End stage renal disease: Secondary | ICD-10-CM | POA: Diagnosis not present

## 2017-08-14 DIAGNOSIS — D509 Iron deficiency anemia, unspecified: Secondary | ICD-10-CM | POA: Diagnosis not present

## 2017-08-16 DIAGNOSIS — N2581 Secondary hyperparathyroidism of renal origin: Secondary | ICD-10-CM | POA: Diagnosis not present

## 2017-08-16 DIAGNOSIS — Z992 Dependence on renal dialysis: Secondary | ICD-10-CM | POA: Diagnosis not present

## 2017-08-16 DIAGNOSIS — N186 End stage renal disease: Secondary | ICD-10-CM | POA: Diagnosis not present

## 2017-08-16 DIAGNOSIS — D631 Anemia in chronic kidney disease: Secondary | ICD-10-CM | POA: Diagnosis not present

## 2017-08-16 DIAGNOSIS — D509 Iron deficiency anemia, unspecified: Secondary | ICD-10-CM | POA: Diagnosis not present

## 2017-08-18 DIAGNOSIS — N186 End stage renal disease: Secondary | ICD-10-CM | POA: Diagnosis not present

## 2017-08-18 DIAGNOSIS — Z992 Dependence on renal dialysis: Secondary | ICD-10-CM | POA: Diagnosis not present

## 2017-08-18 DIAGNOSIS — N2581 Secondary hyperparathyroidism of renal origin: Secondary | ICD-10-CM | POA: Diagnosis not present

## 2017-08-18 DIAGNOSIS — D631 Anemia in chronic kidney disease: Secondary | ICD-10-CM | POA: Diagnosis not present

## 2017-08-18 DIAGNOSIS — D509 Iron deficiency anemia, unspecified: Secondary | ICD-10-CM | POA: Diagnosis not present

## 2017-08-21 DIAGNOSIS — Z992 Dependence on renal dialysis: Secondary | ICD-10-CM | POA: Diagnosis not present

## 2017-08-21 DIAGNOSIS — N186 End stage renal disease: Secondary | ICD-10-CM | POA: Diagnosis not present

## 2017-08-21 DIAGNOSIS — N2581 Secondary hyperparathyroidism of renal origin: Secondary | ICD-10-CM | POA: Diagnosis not present

## 2017-08-21 DIAGNOSIS — D631 Anemia in chronic kidney disease: Secondary | ICD-10-CM | POA: Diagnosis not present

## 2017-08-21 DIAGNOSIS — D509 Iron deficiency anemia, unspecified: Secondary | ICD-10-CM | POA: Diagnosis not present

## 2017-08-23 DIAGNOSIS — N186 End stage renal disease: Secondary | ICD-10-CM | POA: Diagnosis not present

## 2017-08-23 DIAGNOSIS — D509 Iron deficiency anemia, unspecified: Secondary | ICD-10-CM | POA: Diagnosis not present

## 2017-08-23 DIAGNOSIS — N2581 Secondary hyperparathyroidism of renal origin: Secondary | ICD-10-CM | POA: Diagnosis not present

## 2017-08-23 DIAGNOSIS — D631 Anemia in chronic kidney disease: Secondary | ICD-10-CM | POA: Diagnosis not present

## 2017-08-23 DIAGNOSIS — Z992 Dependence on renal dialysis: Secondary | ICD-10-CM | POA: Diagnosis not present

## 2017-08-25 DIAGNOSIS — N2581 Secondary hyperparathyroidism of renal origin: Secondary | ICD-10-CM | POA: Diagnosis not present

## 2017-08-25 DIAGNOSIS — D509 Iron deficiency anemia, unspecified: Secondary | ICD-10-CM | POA: Diagnosis not present

## 2017-08-25 DIAGNOSIS — N186 End stage renal disease: Secondary | ICD-10-CM | POA: Diagnosis not present

## 2017-08-25 DIAGNOSIS — D631 Anemia in chronic kidney disease: Secondary | ICD-10-CM | POA: Diagnosis not present

## 2017-08-25 DIAGNOSIS — Z992 Dependence on renal dialysis: Secondary | ICD-10-CM | POA: Diagnosis not present

## 2017-08-28 DIAGNOSIS — E119 Type 2 diabetes mellitus without complications: Secondary | ICD-10-CM | POA: Diagnosis not present

## 2017-08-28 DIAGNOSIS — D509 Iron deficiency anemia, unspecified: Secondary | ICD-10-CM | POA: Diagnosis not present

## 2017-08-28 DIAGNOSIS — N186 End stage renal disease: Secondary | ICD-10-CM | POA: Diagnosis not present

## 2017-08-28 DIAGNOSIS — D631 Anemia in chronic kidney disease: Secondary | ICD-10-CM | POA: Diagnosis not present

## 2017-08-28 DIAGNOSIS — N2581 Secondary hyperparathyroidism of renal origin: Secondary | ICD-10-CM | POA: Diagnosis not present

## 2017-08-28 DIAGNOSIS — Z794 Long term (current) use of insulin: Secondary | ICD-10-CM | POA: Diagnosis not present

## 2017-08-28 DIAGNOSIS — I259 Chronic ischemic heart disease, unspecified: Secondary | ICD-10-CM | POA: Diagnosis not present

## 2017-08-28 DIAGNOSIS — Z992 Dependence on renal dialysis: Secondary | ICD-10-CM | POA: Diagnosis not present

## 2017-08-30 DIAGNOSIS — N2581 Secondary hyperparathyroidism of renal origin: Secondary | ICD-10-CM | POA: Diagnosis not present

## 2017-08-30 DIAGNOSIS — N186 End stage renal disease: Secondary | ICD-10-CM | POA: Diagnosis not present

## 2017-08-30 DIAGNOSIS — Z992 Dependence on renal dialysis: Secondary | ICD-10-CM | POA: Diagnosis not present

## 2017-08-30 DIAGNOSIS — D631 Anemia in chronic kidney disease: Secondary | ICD-10-CM | POA: Diagnosis not present

## 2017-08-30 DIAGNOSIS — D509 Iron deficiency anemia, unspecified: Secondary | ICD-10-CM | POA: Diagnosis not present

## 2017-09-01 DIAGNOSIS — N186 End stage renal disease: Secondary | ICD-10-CM | POA: Diagnosis not present

## 2017-09-01 DIAGNOSIS — D631 Anemia in chronic kidney disease: Secondary | ICD-10-CM | POA: Diagnosis not present

## 2017-09-01 DIAGNOSIS — Z992 Dependence on renal dialysis: Secondary | ICD-10-CM | POA: Diagnosis not present

## 2017-09-01 DIAGNOSIS — D509 Iron deficiency anemia, unspecified: Secondary | ICD-10-CM | POA: Diagnosis not present

## 2017-09-01 DIAGNOSIS — N2581 Secondary hyperparathyroidism of renal origin: Secondary | ICD-10-CM | POA: Diagnosis not present

## 2017-09-02 DIAGNOSIS — N186 End stage renal disease: Secondary | ICD-10-CM | POA: Diagnosis not present

## 2017-09-02 DIAGNOSIS — Z992 Dependence on renal dialysis: Secondary | ICD-10-CM | POA: Diagnosis not present

## 2017-09-04 DIAGNOSIS — N186 End stage renal disease: Secondary | ICD-10-CM | POA: Diagnosis not present

## 2017-09-04 DIAGNOSIS — Z992 Dependence on renal dialysis: Secondary | ICD-10-CM | POA: Diagnosis not present

## 2017-09-04 DIAGNOSIS — N2581 Secondary hyperparathyroidism of renal origin: Secondary | ICD-10-CM | POA: Diagnosis not present

## 2017-09-04 DIAGNOSIS — D631 Anemia in chronic kidney disease: Secondary | ICD-10-CM | POA: Diagnosis not present

## 2017-09-04 DIAGNOSIS — D509 Iron deficiency anemia, unspecified: Secondary | ICD-10-CM | POA: Diagnosis not present

## 2017-09-04 NOTE — Progress Notes (Signed)
HPI The patient presents for follow up of CAD and diastolic heart failure with diastolic dysfunction.   Since I last saw her has had no acute cardiac problems. She did have a stress test last year which was submaximal but negative .  This was a stress echo.  She's not doing much physical activity. She goes to dialysis 3 times a week. She said she's had cramping toward the end of dialysis frequently. This happens in her chest in her limbs. She thinks it's related to the dry weight around much fluid they pulled. She's asked them to adjust this to see if this helps. She never gets chest discomfort otherwise if she's not on dialysis. She doesn't describe associated nausea vomiting or diaphoresis. She has no shortness of breath, PND or orthopnea. She's had a little bit of weight gain or edema.   Allergies  Allergen Reactions  . Ace Inhibitors Cough  . Clopidogrel Bisulfate Other (See Comments)    REACTION: Sick, Headache, "Felt terrible"  . Codeine Other (See Comments)    REACTION: hallucinations  . Lisinopril Other (See Comments)    cough  . Penicillins Other (See Comments)    Has patient had a PCN reaction causing immediate rash, facial/tongue/throat swelling, SOB or lightheadedness with hypotension: unknown Has patient had a PCN reaction causing severe rash involving mucus membranes or skin necrosis: unknown Has patient had a PCN reaction that required hospitalization unknown Has patient had a PCN reaction occurring within the last 10 years:unknown If all of the above answers are "NO", then may proceed with Cephalosporin use.     Current Outpatient Prescriptions  Medication Sig Dispense Refill  . albuterol (PROVENTIL HFA;VENTOLIN HFA) 108 (90 BASE) MCG/ACT inhaler Inhale 2 puffs into the lungs every 6 (six) hours as needed for wheezing or shortness of breath. 1 Inhaler 2  . albuterol (PROVENTIL) (2.5 MG/3ML) 0.083% nebulizer solution Take 3 mLs (2.5 mg total) by nebulization every 6  (six) hours as needed for wheezing or shortness of breath. 75 mL 12  . aspirin 81 MG EC tablet Take 81 mg by mouth daily.      [name] atenolol (TENORMIN) 50 MG tablet Take 50 mg by mouth 2 (two) times daily.     . budesonide-formoterol (SYMBICORT) 160-4.5 MCG/ACT inhaler Inhale 2 puffs into the lungs 2 (two) times daily as needed.    . calcium acetate (PHOSLO) 667 MG capsule Take 667-1,334 mg by mouth See admin instructions. Take 2 capsules with meals 3 times daily and 1 capsule with snacks    . Carboxymethylcellulose Sodium (THERATEARS OP) Place 2 drops into both eyes as needed (for dry eyes). Reported on 12/09/2015    . docusate sodium (COLACE) 100 MG capsule Take 100 mg by mouth 2 (two) times daily as needed for mild constipation.    [name] escitalopram (LEXAPRO) 10 MG tablet Take 1 tablet (10 mg total) by mouth daily. 90 tablet 3  . hydrALAZINE (APRESOLINE) 50 MG tablet Take 1 tablet (50 mg total) by mouth 2 (two) times daily. 1 tablet 0  . Insulin Glargine (LANTUS SOLOSTAR) 100 UNIT/ML Solostar Pen Inject 10 Units into the skin daily at 10 pm. (Patient taking differently: Inject 15 Units into the skin daily at 10 pm. ) 15 mL 11  . LORazepam (ATIVAN) 0.5 MG tablet Take 1 tablet (0.5 mg total) by mouth every 8 (eight) hours as needed for anxiety. 20 tablet 1  . NIFEdipine (PROCARDIA XL/ADALAT-CC) 90 MG 24 hr tablet Take 90  mg by mouth every evening.     . nitroGLYCERIN (NITROSTAT) 0.4 MG SL tablet Place 1 tablet (0.4 mg total) under the tongue every 5 (five) minutes as needed for chest pain. 25 tablet 5  . pravastatin (PRAVACHOL) 20 MG tablet Take 20 mg by mouth daily.    [name] torsemide (DEMADEX) 20 MG tablet Take 2 tablets (40 mg total) by mouth daily. New medicine to reduce fluid build up. (Patient taking differently: Take 40-60 mg by mouth See admin instructions. Take 40 mg by mouth daily, **Take 20 mg extra tablet (60mg  total) if weight is over 3-4 pounds.) 60 tablet 3  . glucose blood test strip Test 4 times  daily (Patient not taking: Reported on 08/10/2017) 120 each 5   No current facility-administered medications for this visit.     Past Medical History:  Diagnosis Date  . Acute on chronic diastolic CHF (congestive heart failure) ([place]) 03/05/2015   Grade 2. EF 60-65%  . Anemia    hx of  . Anxiety   . Arthritis   . CAD (coronary artery disease)    s/p Stenting in 2005;  [facility] 7/09: Vigorous LV function, 2-3+ MR, RI 40%, RI stent patent, proximal-mid RCA 40-50%;  E[place] 7/09:  EF 55-65%, trivial MR;  [facility] 11/18/12: EF 66%, normal LV wall motion, mild to moderate anterior ischemia - reviewed by [facility] and felt to rep breast atten (low risk);  Echo 6/14: mild LVH, EF 55-65%, Gr 2 DD, PASP 44, trivial eff   . Chronic kidney disease    CREATININE IS UP--GOING TO KIDNEY MD   . Chronic neck pain   . COPD (chronic obstructive pulmonary disease) ([place])   . Degenerative joint disease    left shoulder  . Diabetic neuropathy ([place])   . Diverticulosis   . Esophageal stricture   . GERD (gastroesophageal reflux disease)   . Hyperlipidemia   . Hypertension   . IDDM (insulin dependent diabetes mellitus) ([place])   . Pericardial effusion 03/06/2015   Very small per echo ; pleural nodules seen on CT chest.  . Peripheral vascular disease ([place])   . Stroke [facility])    "mini- years ago"  . Vitamin D deficiency     Past Surgical History:  Procedure Laterality Date  . A/V FISTULAGRAM N/A 08/10/2017   Procedure: A/V Fistulagram - Right Arm;  Surgeon: Elam Dutch, MD;  Location: [place] [facility];  Service: Cardiovascular;  Laterality: N/A;  . ABDOMINAL HYSTERECTOMY    . AV FISTULA PLACEMENT Right 07/20/2015   Procedure: RADIAL CEPHALIC ARTERIOVENOUS FISTULA CREATION RIGHT ARM;  Surgeon: Angelia Mould, MD;  Location: [place];  Service: Vascular;  Laterality: Right;  . AV FISTULA PLACEMENT Right 11/26/2015   Procedure:  Creation of RIGHT BRACHIO-CEPHALIC arteriovenous fistula;  Surgeon: Angelia Mould,  MD;  Location: [place];  Service: Vascular;  Laterality: Right;  . BACK SURGERY     2007 & 2013  . BLADDER SURGERY     Tack  . CARDIAC CATHETERIZATION  2003, 2005, 2009   1 stent  . cardiac stents    . CATARACT EXTRACTION W/PHACO Right 05/04/2014   Procedure: CATARACT EXTRACTION PHACO AND INTRAOCULAR LENS PLACEMENT (IOC);  Surgeon: Williams Che, MD;  Location: [facility];  Service: Ophthalmology;  Laterality: Right;  CDE 1.47  . CATARACT EXTRACTION W/PHACO Left 07/20/2014   Procedure: CATARACT EXTRACTION WITH PHACO AND INTRAOCULAR LENS PLACEMENT LEFT EYE CDE=1.79;  Surgeon: Williams Che, MD;  Location: [facility];  Service: Ophthalmology;  Laterality: Left;  . CERVICAL SPINE SURGERY  2012   8 screws  . CYSTOSCOPY WITH INJECTION N/A 06/03/2013   Procedure: MACROPLASTIQUE  INJECTION ;  Surgeon: Malka So, MD;  Location: [facility];  Service: Urology;  Laterality: N/A;  . FISTULOGRAM Right 10/20/2016   Procedure: FISTULOGRAM WITH POSSIBLE INTERVENTION;  Surgeon: [name], MD;  Location: [facility];  Service: Vascular;  Laterality: Right;  . INSERTION OF DIALYSIS CATHETER N/A 11/26/2015   Procedure: INSERTION OF DIALYSIS CATHETER;  Surgeon: Angelia Mould, MD;  Location: [place];  Service: Vascular;  Laterality: N/A;  . LAMINECTOMY  12/29/2011  . LUMBAR LAMINECTOMY/DECOMPRESSION MICRODISCECTOMY  12/29/2011   Procedure: LUMBAR LAMINECTOMY/DECOMPRESSION MICRODISCECTOMY;  Surgeon: Floyce Stakes, MD;  Location: [place] [facility];  Service: Neurosurgery;  Laterality: N/A;  Lumbar Two through Lumbar Five Laminectomies/Cellsaver  . PARTIAL HYSTERECTOMY    . PERIPHERAL VASCULAR CATHETERIZATION N/A 11/22/2015   Procedure: Fistulagram;  Surgeon: Angelia Mould, MD;  Location: [place] [facility];  Service: Cardiovascular;  Laterality: N/A;  . PERIPHERAL VASCULAR INTERVENTION  08/10/2017   Procedure: PERIPHERAL VASCULAR INTERVENTION;  Surgeon: Elam Dutch, MD;  Location: [place] [facility];   Service: Cardiovascular;;  . SHOULDER SURGERY Right    Rotator cuff    ROS:  Review of Systems  All other systems reviewed and are negative.   PHYSICAL EXAM BP 138/60   Pulse (!) 58   Ht 5' 3.75" (1.619 m)   Wt 170 lb (77.1 kg)   BMI 29.41 kg/m   GENERAL:  Well appearing NECK:  No jugular venous distention, waveform within normal limits, carotid upstroke brisk and symmetric, no bruits, no thyromegaly LUNGS:  Clear to auscultation bilaterally CHEST:  Unremarkable HEART:  PMI not displaced or sustained,S1 and S2 within normal limits, no S3, no S4, no clicks, no rubs, no murmurs ABD:  Flat, positive bowel sounds normal in frequency in pitch, no bruits, no rebound, no guarding, no midline pulsatile mass, no hepatomegaly, no splenomegaly EXT:  2 plus pulses throughout, no edema, no cyanosis no clubbing, right upper arm fistula with thrill and bruit.    EKG:  Sinus rhythm, rate 58 , preexcitation, premature ventricular contraction, right axis deviation, poor anterior R wave progression.  Prolonged QT.    ASSESSMENT AND PLAN   CAD -  She had a negative submaximal stress test in Feb of last year.  I do not suspect that her current complaints are related to a cardiac issue as they only happen late in dialysis. However, if she has her sessions changed with different dry weight or less fluid: She still getting this complaint I would want to see her back to further discuss this.  PROLONGED QT - The patient does have a prolonged QT other shorter than previous. She should avoid any further QT prolonging drugs. She's on a beta blocker. She's had no symptoms related to this. No further testing is indicated.  HYPERTENSION, BENIGN -  The blood pressure is at target. No change in medications is indicated. We will continue with therapeutic lifestyle changes (TLC).  DYSLIPIDEMIA -   The last lipid profile that I see was last year.  She should have this repeated.   CHRONIC DIASTOLIC - I will  defer volume management to her dialysis.

## 2017-09-05 ENCOUNTER — Ambulatory Visit (INDEPENDENT_AMBULATORY_CARE_PROVIDER_SITE_OTHER): Payer: Medicare Other | Admitting: Cardiology

## 2017-09-05 ENCOUNTER — Encounter: Payer: Self-pay | Admitting: Cardiology

## 2017-09-05 VITALS — BP 138/60 | HR 58 | Ht 63.75 in | Wt 170.0 lb

## 2017-09-05 DIAGNOSIS — I5032 Chronic diastolic (congestive) heart failure: Secondary | ICD-10-CM | POA: Diagnosis not present

## 2017-09-05 DIAGNOSIS — E785 Hyperlipidemia, unspecified: Secondary | ICD-10-CM

## 2017-09-05 DIAGNOSIS — I2510 Atherosclerotic heart disease of native coronary artery without angina pectoris: Secondary | ICD-10-CM

## 2017-09-05 DIAGNOSIS — R9431 Abnormal electrocardiogram [ECG] [EKG]: Secondary | ICD-10-CM | POA: Diagnosis not present

## 2017-09-05 DIAGNOSIS — I25119 Atherosclerotic heart disease of native coronary artery with unspecified angina pectoris: Secondary | ICD-10-CM

## 2017-09-05 DIAGNOSIS — I10 Essential (primary) hypertension: Secondary | ICD-10-CM | POA: Diagnosis not present

## 2017-09-05 NOTE — Patient Instructions (Signed)
Medication Instructions:  The current medical regimen is effective;  continue present plan and medications.  Follow-Up: Follow up in 6 months with Dr. Hochrein.  You will receive a letter in the mail 2 months before you are due.  Please call us when you receive this letter to schedule your follow up appointment.  If you need a refill on your cardiac medications before your next appointment, please call your pharmacy.  Thank you for choosing [facility]!!       

## 2017-09-06 ENCOUNTER — Encounter: Payer: Self-pay | Admitting: Cardiology

## 2017-09-06 DIAGNOSIS — I2510 Atherosclerotic heart disease of native coronary artery without angina pectoris: Secondary | ICD-10-CM | POA: Insufficient documentation

## 2017-09-06 DIAGNOSIS — R9431 Abnormal electrocardiogram [ECG] [EKG]: Secondary | ICD-10-CM | POA: Insufficient documentation

## 2017-09-06 DIAGNOSIS — D631 Anemia in chronic kidney disease: Secondary | ICD-10-CM | POA: Diagnosis not present

## 2017-09-06 DIAGNOSIS — N2581 Secondary hyperparathyroidism of renal origin: Secondary | ICD-10-CM | POA: Diagnosis not present

## 2017-09-06 DIAGNOSIS — Z992 Dependence on renal dialysis: Secondary | ICD-10-CM | POA: Diagnosis not present

## 2017-09-06 DIAGNOSIS — I200 Unstable angina: Secondary | ICD-10-CM | POA: Insufficient documentation

## 2017-09-06 DIAGNOSIS — N186 End stage renal disease: Secondary | ICD-10-CM | POA: Diagnosis not present

## 2017-09-06 DIAGNOSIS — D509 Iron deficiency anemia, unspecified: Secondary | ICD-10-CM | POA: Diagnosis not present

## 2017-09-08 DIAGNOSIS — Z992 Dependence on renal dialysis: Secondary | ICD-10-CM | POA: Diagnosis not present

## 2017-09-08 DIAGNOSIS — N186 End stage renal disease: Secondary | ICD-10-CM | POA: Diagnosis not present

## 2017-09-08 DIAGNOSIS — N2581 Secondary hyperparathyroidism of renal origin: Secondary | ICD-10-CM | POA: Diagnosis not present

## 2017-09-08 DIAGNOSIS — D509 Iron deficiency anemia, unspecified: Secondary | ICD-10-CM | POA: Diagnosis not present

## 2017-09-08 DIAGNOSIS — D631 Anemia in chronic kidney disease: Secondary | ICD-10-CM | POA: Diagnosis not present

## 2017-09-10 DIAGNOSIS — H04123 Dry eye syndrome of bilateral lacrimal glands: Secondary | ICD-10-CM | POA: Diagnosis not present

## 2017-09-10 DIAGNOSIS — H01009 Unspecified blepharitis unspecified eye, unspecified eyelid: Secondary | ICD-10-CM | POA: Diagnosis not present

## 2017-09-10 DIAGNOSIS — H40013 Open angle with borderline findings, low risk, bilateral: Secondary | ICD-10-CM | POA: Diagnosis not present

## 2017-09-11 DIAGNOSIS — N186 End stage renal disease: Secondary | ICD-10-CM | POA: Diagnosis not present

## 2017-09-11 DIAGNOSIS — D509 Iron deficiency anemia, unspecified: Secondary | ICD-10-CM | POA: Diagnosis not present

## 2017-09-11 DIAGNOSIS — N2581 Secondary hyperparathyroidism of renal origin: Secondary | ICD-10-CM | POA: Diagnosis not present

## 2017-09-11 DIAGNOSIS — Z992 Dependence on renal dialysis: Secondary | ICD-10-CM | POA: Diagnosis not present

## 2017-09-11 DIAGNOSIS — D631 Anemia in chronic kidney disease: Secondary | ICD-10-CM | POA: Diagnosis not present

## 2017-09-13 DIAGNOSIS — D631 Anemia in chronic kidney disease: Secondary | ICD-10-CM | POA: Diagnosis not present

## 2017-09-13 DIAGNOSIS — Z992 Dependence on renal dialysis: Secondary | ICD-10-CM | POA: Diagnosis not present

## 2017-09-13 DIAGNOSIS — D509 Iron deficiency anemia, unspecified: Secondary | ICD-10-CM | POA: Diagnosis not present

## 2017-09-13 DIAGNOSIS — N2581 Secondary hyperparathyroidism of renal origin: Secondary | ICD-10-CM | POA: Diagnosis not present

## 2017-09-13 DIAGNOSIS — N186 End stage renal disease: Secondary | ICD-10-CM | POA: Diagnosis not present

## 2017-09-15 DIAGNOSIS — D509 Iron deficiency anemia, unspecified: Secondary | ICD-10-CM | POA: Diagnosis not present

## 2017-09-15 DIAGNOSIS — N186 End stage renal disease: Secondary | ICD-10-CM | POA: Diagnosis not present

## 2017-09-15 DIAGNOSIS — D631 Anemia in chronic kidney disease: Secondary | ICD-10-CM | POA: Diagnosis not present

## 2017-09-15 DIAGNOSIS — N2581 Secondary hyperparathyroidism of renal origin: Secondary | ICD-10-CM | POA: Diagnosis not present

## 2017-09-15 DIAGNOSIS — Z992 Dependence on renal dialysis: Secondary | ICD-10-CM | POA: Diagnosis not present

## 2017-09-17 ENCOUNTER — Ambulatory Visit (INDEPENDENT_AMBULATORY_CARE_PROVIDER_SITE_OTHER): Payer: Medicare Other

## 2017-09-17 ENCOUNTER — Encounter: Payer: Self-pay | Admitting: Pediatrics

## 2017-09-17 ENCOUNTER — Ambulatory Visit (INDEPENDENT_AMBULATORY_CARE_PROVIDER_SITE_OTHER): Payer: Medicare Other | Admitting: Pediatrics

## 2017-09-17 VITALS — BP 134/67 | HR 55 | Temp 98.3°F | Ht 63.75 in | Wt 175.0 lb

## 2017-09-17 DIAGNOSIS — M25512 Pain in left shoulder: Secondary | ICD-10-CM

## 2017-09-17 DIAGNOSIS — M546 Pain in thoracic spine: Secondary | ICD-10-CM

## 2017-09-17 DIAGNOSIS — G8929 Other chronic pain: Secondary | ICD-10-CM

## 2017-09-17 DIAGNOSIS — I25119 Atherosclerotic heart disease of native coronary artery with unspecified angina pectoris: Secondary | ICD-10-CM

## 2017-09-17 DIAGNOSIS — E1121 Type 2 diabetes mellitus with diabetic nephropathy: Secondary | ICD-10-CM | POA: Diagnosis not present

## 2017-09-17 DIAGNOSIS — E785 Hyperlipidemia, unspecified: Secondary | ICD-10-CM

## 2017-09-17 MED ORDER — INSULIN GLARGINE 100 UNIT/ML SOLOSTAR PEN: 15.0000 [IU] | PEN_INJECTOR | Freq: Every day | SUBCUTANEOUS | 3 refills | Status: DC

## 2017-09-17 MED ORDER — CYCLOBENZAPRINE HCL 5 MG PO TABS: 2.5000 mg | ORAL_TABLET | Freq: Every day | ORAL | 0 refills | Status: DC | PRN

## 2017-09-17 NOTE — Progress Notes (Signed)
  Subjective:   Patient ID: [name], female    DOB: [birth year], 66 y.o.   MRN: [id number] CC: Abdominal Pain (Left side)  HPI: [name] is a 66 y.o. female presenting for Abdominal Pain (Left side)  DM2: takes 10u and has BGLs in AM 140s-150s When she takes 15u has BGLs in the 110s-120s No lows A1c at dialysis most recently 6.9, up from 6.2  HLD: total cholesterol up to 199 hasnt been taking statin regularly, restarted taking it after elevation seen  L side continues to bother her off and on Wants to move when it feels achey Certain movements seem to make it worse  Relevant past medical, surgical, family and social history reviewed. Allergies and medications reviewed and updated. History  Smoking Status  . Former Smoker  . Packs/day: 1.00  . Years: 30.00  . Quit date: 12/04/2001  Smokeless Tobacco  . Never Used   ROS: Per HPI   Objective:    BP 134/67   Pulse (!) 55   Temp 98.3 F (36.8 C) (Oral)   Ht 5' 3.74" (1.619 m)   Wt 175 lb (79.4 kg)   BMI 30.28 kg/m   Wt Readings from Last 3 Encounters:  09/17/17 175 lb (79.4 kg)  09/05/17 170 lb (77.1 kg)  08/10/17 165 lb (74.8 kg)    Gen: NAD, alert, cooperative with exam, NCAT EYES: EOMI, no conjunctival injection, or no icterus ENT:  OP without erythema LYMPH: no cervical LAD CV: NRRR, normal S1/S2, no murmur, distal pulses 2+ b/l Resp: CTABL, no wheezes, normal WOB Abd: +BS, soft, NTND. no guarding or organomegaly Ext: No edema, warm Neuro: Alert and oriented, strength equal b/l UE and LE MSK: no tenderness over L lower ribs  Assessment & Plan:  [name] was seen today for L sided pain  Diagnoses and all orders for this visit:  Chronic left shoulder pain H/o rotator cuff problems per pt -     DG Shoulder Left; Future  Type II diabetes mellitus with nephropathy (HCC) Cont 15u at night -     Insulin Glargine (LANTUS SOLOSTAR) 100 UNIT/ML Solostar Pen; Inject 15 Units into the skin daily at 10  pm.  Chronic left-sided thoracic back pain No ttp over rib No ttp LUQ abd Movement can make pain worse Trial of below -     cyclobenzaprine (FLEXERIL) 5 MG tablet; Take 0.5 tablets (2.5 mg total) by mouth daily as needed for muscle spasms.  Hyperlipidemia, unspecified hyperlipidemia type Restart statin  Follow up plan: Return in about 3 months (around 12/18/2017). [name], MD [place]

## 2017-09-18 DIAGNOSIS — D631 Anemia in chronic kidney disease: Secondary | ICD-10-CM | POA: Diagnosis not present

## 2017-09-18 DIAGNOSIS — I2510 Atherosclerotic heart disease of native coronary artery without angina pectoris: Secondary | ICD-10-CM | POA: Diagnosis not present

## 2017-09-18 DIAGNOSIS — Z79899 Other long term (current) drug therapy: Secondary | ICD-10-CM | POA: Diagnosis not present

## 2017-09-18 DIAGNOSIS — D509 Iron deficiency anemia, unspecified: Secondary | ICD-10-CM | POA: Diagnosis not present

## 2017-09-18 DIAGNOSIS — E1122 Type 2 diabetes mellitus with diabetic chronic kidney disease: Secondary | ICD-10-CM | POA: Diagnosis not present

## 2017-09-18 DIAGNOSIS — I2584 Coronary atherosclerosis due to calcified coronary lesion: Secondary | ICD-10-CM | POA: Diagnosis not present

## 2017-09-18 DIAGNOSIS — I509 Heart failure, unspecified: Secondary | ICD-10-CM | POA: Diagnosis not present

## 2017-09-18 DIAGNOSIS — N186 End stage renal disease: Secondary | ICD-10-CM | POA: Diagnosis not present

## 2017-09-18 DIAGNOSIS — M47816 Spondylosis without myelopathy or radiculopathy, lumbar region: Secondary | ICD-10-CM | POA: Diagnosis not present

## 2017-09-18 DIAGNOSIS — Z7951 Long term (current) use of inhaled steroids: Secondary | ICD-10-CM | POA: Diagnosis not present

## 2017-09-18 DIAGNOSIS — R911 Solitary pulmonary nodule: Secondary | ICD-10-CM | POA: Diagnosis not present

## 2017-09-18 DIAGNOSIS — Z87891 Personal history of nicotine dependence: Secondary | ICD-10-CM | POA: Diagnosis not present

## 2017-09-18 DIAGNOSIS — I132 Hypertensive heart and chronic kidney disease with heart failure and with stage 5 chronic kidney disease, or end stage renal disease: Secondary | ICD-10-CM | POA: Diagnosis not present

## 2017-09-18 DIAGNOSIS — Z7982 Long term (current) use of aspirin: Secondary | ICD-10-CM | POA: Diagnosis not present

## 2017-09-18 DIAGNOSIS — Z992 Dependence on renal dialysis: Secondary | ICD-10-CM | POA: Diagnosis not present

## 2017-09-18 DIAGNOSIS — I700 Atherosclerosis of aorta: Secondary | ICD-10-CM | POA: Diagnosis not present

## 2017-09-18 DIAGNOSIS — R109 Unspecified abdominal pain: Secondary | ICD-10-CM | POA: Diagnosis not present

## 2017-09-18 DIAGNOSIS — E876 Hypokalemia: Secondary | ICD-10-CM | POA: Diagnosis not present

## 2017-09-18 DIAGNOSIS — Z794 Long term (current) use of insulin: Secondary | ICD-10-CM | POA: Diagnosis not present

## 2017-09-18 DIAGNOSIS — N3001 Acute cystitis with hematuria: Secondary | ICD-10-CM | POA: Diagnosis not present

## 2017-09-18 DIAGNOSIS — N2581 Secondary hyperparathyroidism of renal origin: Secondary | ICD-10-CM | POA: Diagnosis not present

## 2017-09-18 DIAGNOSIS — R0789 Other chest pain: Secondary | ICD-10-CM | POA: Diagnosis not present

## 2017-09-18 DIAGNOSIS — R918 Other nonspecific abnormal finding of lung field: Secondary | ICD-10-CM | POA: Diagnosis not present

## 2017-09-18 DIAGNOSIS — J449 Chronic obstructive pulmonary disease, unspecified: Secondary | ICD-10-CM | POA: Diagnosis not present

## 2017-09-20 ENCOUNTER — Telehealth: Payer: Self-pay | Admitting: Pediatrics

## 2017-09-20 DIAGNOSIS — D631 Anemia in chronic kidney disease: Secondary | ICD-10-CM | POA: Diagnosis not present

## 2017-09-20 DIAGNOSIS — Z992 Dependence on renal dialysis: Secondary | ICD-10-CM | POA: Diagnosis not present

## 2017-09-20 DIAGNOSIS — N2581 Secondary hyperparathyroidism of renal origin: Secondary | ICD-10-CM | POA: Diagnosis not present

## 2017-09-20 DIAGNOSIS — D509 Iron deficiency anemia, unspecified: Secondary | ICD-10-CM | POA: Diagnosis not present

## 2017-09-20 DIAGNOSIS — N186 End stage renal disease: Secondary | ICD-10-CM | POA: Diagnosis not present

## 2017-09-21 NOTE — Telephone Encounter (Signed)
Hospital follow up scheduled Pt notified

## 2017-09-22 DIAGNOSIS — D631 Anemia in chronic kidney disease: Secondary | ICD-10-CM | POA: Diagnosis not present

## 2017-09-22 DIAGNOSIS — Z992 Dependence on renal dialysis: Secondary | ICD-10-CM | POA: Diagnosis not present

## 2017-09-22 DIAGNOSIS — N186 End stage renal disease: Secondary | ICD-10-CM | POA: Diagnosis not present

## 2017-09-22 DIAGNOSIS — N2581 Secondary hyperparathyroidism of renal origin: Secondary | ICD-10-CM | POA: Diagnosis not present

## 2017-09-22 DIAGNOSIS — D509 Iron deficiency anemia, unspecified: Secondary | ICD-10-CM | POA: Diagnosis not present

## 2017-09-25 DIAGNOSIS — Z992 Dependence on renal dialysis: Secondary | ICD-10-CM | POA: Diagnosis not present

## 2017-09-25 DIAGNOSIS — D509 Iron deficiency anemia, unspecified: Secondary | ICD-10-CM | POA: Diagnosis not present

## 2017-09-25 DIAGNOSIS — N2581 Secondary hyperparathyroidism of renal origin: Secondary | ICD-10-CM | POA: Diagnosis not present

## 2017-09-25 DIAGNOSIS — D631 Anemia in chronic kidney disease: Secondary | ICD-10-CM | POA: Diagnosis not present

## 2017-09-25 DIAGNOSIS — N186 End stage renal disease: Secondary | ICD-10-CM | POA: Diagnosis not present

## 2017-09-26 ENCOUNTER — Ambulatory Visit (INDEPENDENT_AMBULATORY_CARE_PROVIDER_SITE_OTHER): Payer: Medicare Other | Admitting: Pediatrics

## 2017-09-26 ENCOUNTER — Encounter: Payer: Self-pay | Admitting: Pediatrics

## 2017-09-26 VITALS — BP 155/63 | HR 71 | Temp 97.5°F | Ht 63.74 in | Wt 171.4 lb

## 2017-09-26 DIAGNOSIS — R399 Unspecified symptoms and signs involving the genitourinary system: Secondary | ICD-10-CM | POA: Diagnosis not present

## 2017-09-26 DIAGNOSIS — N281 Cyst of kidney, acquired: Secondary | ICD-10-CM | POA: Diagnosis not present

## 2017-09-26 DIAGNOSIS — R109 Unspecified abdominal pain: Secondary | ICD-10-CM

## 2017-09-26 DIAGNOSIS — N3090 Cystitis, unspecified without hematuria: Secondary | ICD-10-CM

## 2017-09-26 DIAGNOSIS — K5900 Constipation, unspecified: Secondary | ICD-10-CM | POA: Diagnosis not present

## 2017-09-26 DIAGNOSIS — I25119 Atherosclerotic heart disease of native coronary artery with unspecified angina pectoris: Secondary | ICD-10-CM

## 2017-09-26 LAB — URINALYSIS, COMPLETE
Bilirubin, UA: NEGATIVE
Glucose, UA: NEGATIVE
Ketones, UA: NEGATIVE
Nitrite, UA: NEGATIVE
RBC, UA: NEGATIVE
Specific Gravity, UA: 1.025 (ref 1.005–1.030)
Urobilinogen, Ur: 0.2 mg/dL (ref 0.2–1.0)
pH, UA: 5.5 (ref 5.0–7.5)

## 2017-09-26 LAB — MICROSCOPIC EXAMINATION: Renal Epithel, UA: NONE SEEN /hpf

## 2017-09-26 MED ORDER — CIPROFLOXACIN HCL 250 MG PO TABS: ORAL_TABLET | ORAL | 0 refills | Status: DC

## 2017-09-26 NOTE — Progress Notes (Signed)
  Subjective:   Patient ID: [name], female    DOB: Mar 04, 1951, 67 y.o.   MRN: [id number] CC: Abdominal Pain (left lower) and low potassium  HPI: [name] is a 66 y.o. female presenting for Abdominal Pain (left lower) and low potassium  Continues to have nagging L sided upper abd pain Had to call EMS from dialysis last week because pain was so intense There all the time Sometimes worse, when huritng a lot if she holds perfectly still it eases pain some Tramadol from ED helped slightly Muscle relaxer has helped slightly Not having daily stools CT scan from [facility] with renal cyst, normal spleen Needs repeat CT in 6-12 mo for f/u lung nodule R side Moderate stool in rectum No bowel thickening She was diagnosed with UTI in ED Culture read as multiple specimen Was started on keflex Has not helped with pain  Appetite has been fine No vomiting No fevers  Relevant past medical, surgical, family and social history reviewed. Allergies and medications reviewed and updated. History  Smoking Status  . Former Smoker  . Packs/day: 1.00  . Years: 30.00  . Quit date: 12/04/2001  Smokeless Tobacco  . Never Used   ROS: Per HPI   Objective:    BP (!) 155/63   Pulse 71   Temp (!) 97.5 F (36.4 C) (Oral)   Ht 5' 3.74" (1.619 m)   Wt 171 lb 6.4 oz (77.7 kg)   BMI 29.66 kg/m   Wt Readings from Last 3 Encounters:  09/26/17 171 lb 6.4 oz (77.7 kg)  09/17/17 175 lb (79.4 kg)  09/05/17 170 lb (77.1 kg)    Gen: NAD, alert, cooperative with exam, NCAT EYES: EOMI, no conjunctival injection, or no icterus ENT:  TMs pearly gray b/l, OP without erythema LYMPH: no cervical LAD CV: NRRR, normal S1/S2, no murmur, distal pulses 2+ b/l Resp: CTABL, no wheezes, normal WOB Abd: +BS, soft, NTND. no guarding or organomegaly Ext: No edema, warm Neuro: Alert and oriented, strength equal b/l UE and LE, coordination grossly normal MSK: normal muscle bulk  Assessment & Plan:    [name] was seen today for abdominal pain and low potassium.  Diagnoses and all orders for this visit:  Left sided abdominal pain Continues to have nagging LUQ pain Baker to have UTI in ED, started on keflex, no improvement in symptoms U culture returned with multiple organisms, needed repeat collection Still with +UA here Repeat urine culture today Start cipro, renally dosed Cont to work on constipation, goal daily stools  Renal cyst -     Ambulatory referral to Urology  UTI symptoms -     Urinalysis, Complete -     Urine Culture  Constipation, unspecified constipation type  Cystitis -     ciprofloxacin (CIPRO) 250 MG tablet; Take once a day, after dialysis on dialysis days  Other orders -     Microscopic Examination   Follow up plan: As scheduled Tamara Found, MD [place]

## 2017-09-27 DIAGNOSIS — N186 End stage renal disease: Secondary | ICD-10-CM | POA: Diagnosis not present

## 2017-09-27 DIAGNOSIS — D631 Anemia in chronic kidney disease: Secondary | ICD-10-CM | POA: Diagnosis not present

## 2017-09-27 DIAGNOSIS — N2581 Secondary hyperparathyroidism of renal origin: Secondary | ICD-10-CM | POA: Diagnosis not present

## 2017-09-27 DIAGNOSIS — D509 Iron deficiency anemia, unspecified: Secondary | ICD-10-CM | POA: Diagnosis not present

## 2017-09-27 DIAGNOSIS — Z992 Dependence on renal dialysis: Secondary | ICD-10-CM | POA: Diagnosis not present

## 2017-09-28 LAB — URINE CULTURE

## 2017-09-29 DIAGNOSIS — D509 Iron deficiency anemia, unspecified: Secondary | ICD-10-CM | POA: Diagnosis not present

## 2017-09-29 DIAGNOSIS — N186 End stage renal disease: Secondary | ICD-10-CM | POA: Diagnosis not present

## 2017-09-29 DIAGNOSIS — N2581 Secondary hyperparathyroidism of renal origin: Secondary | ICD-10-CM | POA: Diagnosis not present

## 2017-09-29 DIAGNOSIS — D631 Anemia in chronic kidney disease: Secondary | ICD-10-CM | POA: Diagnosis not present

## 2017-09-29 DIAGNOSIS — Z992 Dependence on renal dialysis: Secondary | ICD-10-CM | POA: Diagnosis not present

## 2017-10-02 DIAGNOSIS — Z992 Dependence on renal dialysis: Secondary | ICD-10-CM | POA: Diagnosis not present

## 2017-10-02 DIAGNOSIS — N186 End stage renal disease: Secondary | ICD-10-CM | POA: Diagnosis not present

## 2017-10-02 DIAGNOSIS — D631 Anemia in chronic kidney disease: Secondary | ICD-10-CM | POA: Diagnosis not present

## 2017-10-02 DIAGNOSIS — N2581 Secondary hyperparathyroidism of renal origin: Secondary | ICD-10-CM | POA: Diagnosis not present

## 2017-10-02 DIAGNOSIS — D509 Iron deficiency anemia, unspecified: Secondary | ICD-10-CM | POA: Diagnosis not present

## 2017-10-04 DIAGNOSIS — Z992 Dependence on renal dialysis: Secondary | ICD-10-CM | POA: Diagnosis not present

## 2017-10-04 DIAGNOSIS — N2581 Secondary hyperparathyroidism of renal origin: Secondary | ICD-10-CM | POA: Diagnosis not present

## 2017-10-04 DIAGNOSIS — D631 Anemia in chronic kidney disease: Secondary | ICD-10-CM | POA: Diagnosis not present

## 2017-10-04 DIAGNOSIS — D509 Iron deficiency anemia, unspecified: Secondary | ICD-10-CM | POA: Diagnosis not present

## 2017-10-04 DIAGNOSIS — N186 End stage renal disease: Secondary | ICD-10-CM | POA: Diagnosis not present

## 2017-10-06 DIAGNOSIS — N2581 Secondary hyperparathyroidism of renal origin: Secondary | ICD-10-CM | POA: Diagnosis not present

## 2017-10-06 DIAGNOSIS — D631 Anemia in chronic kidney disease: Secondary | ICD-10-CM | POA: Diagnosis not present

## 2017-10-06 DIAGNOSIS — N186 End stage renal disease: Secondary | ICD-10-CM | POA: Diagnosis not present

## 2017-10-06 DIAGNOSIS — D509 Iron deficiency anemia, unspecified: Secondary | ICD-10-CM | POA: Diagnosis not present

## 2017-10-06 DIAGNOSIS — Z992 Dependence on renal dialysis: Secondary | ICD-10-CM | POA: Diagnosis not present

## 2017-10-09 DIAGNOSIS — N186 End stage renal disease: Secondary | ICD-10-CM | POA: Diagnosis not present

## 2017-10-09 DIAGNOSIS — D509 Iron deficiency anemia, unspecified: Secondary | ICD-10-CM | POA: Diagnosis not present

## 2017-10-09 DIAGNOSIS — D631 Anemia in chronic kidney disease: Secondary | ICD-10-CM | POA: Diagnosis not present

## 2017-10-09 DIAGNOSIS — N2581 Secondary hyperparathyroidism of renal origin: Secondary | ICD-10-CM | POA: Diagnosis not present

## 2017-10-09 DIAGNOSIS — Z992 Dependence on renal dialysis: Secondary | ICD-10-CM | POA: Diagnosis not present

## 2017-10-11 DIAGNOSIS — D631 Anemia in chronic kidney disease: Secondary | ICD-10-CM | POA: Diagnosis not present

## 2017-10-11 DIAGNOSIS — D509 Iron deficiency anemia, unspecified: Secondary | ICD-10-CM | POA: Diagnosis not present

## 2017-10-11 DIAGNOSIS — N2581 Secondary hyperparathyroidism of renal origin: Secondary | ICD-10-CM | POA: Diagnosis not present

## 2017-10-11 DIAGNOSIS — N186 End stage renal disease: Secondary | ICD-10-CM | POA: Diagnosis not present

## 2017-10-11 DIAGNOSIS — Z992 Dependence on renal dialysis: Secondary | ICD-10-CM | POA: Diagnosis not present

## 2017-10-13 DIAGNOSIS — D631 Anemia in chronic kidney disease: Secondary | ICD-10-CM | POA: Diagnosis not present

## 2017-10-13 DIAGNOSIS — N2581 Secondary hyperparathyroidism of renal origin: Secondary | ICD-10-CM | POA: Diagnosis not present

## 2017-10-13 DIAGNOSIS — N186 End stage renal disease: Secondary | ICD-10-CM | POA: Diagnosis not present

## 2017-10-13 DIAGNOSIS — D509 Iron deficiency anemia, unspecified: Secondary | ICD-10-CM | POA: Diagnosis not present

## 2017-10-13 DIAGNOSIS — Z992 Dependence on renal dialysis: Secondary | ICD-10-CM | POA: Diagnosis not present

## 2017-10-16 DIAGNOSIS — D509 Iron deficiency anemia, unspecified: Secondary | ICD-10-CM | POA: Diagnosis not present

## 2017-10-16 DIAGNOSIS — D631 Anemia in chronic kidney disease: Secondary | ICD-10-CM | POA: Diagnosis not present

## 2017-10-16 DIAGNOSIS — N186 End stage renal disease: Secondary | ICD-10-CM | POA: Diagnosis not present

## 2017-10-16 DIAGNOSIS — N2581 Secondary hyperparathyroidism of renal origin: Secondary | ICD-10-CM | POA: Diagnosis not present

## 2017-10-16 DIAGNOSIS — Z992 Dependence on renal dialysis: Secondary | ICD-10-CM | POA: Diagnosis not present

## 2017-10-18 DIAGNOSIS — Z992 Dependence on renal dialysis: Secondary | ICD-10-CM | POA: Diagnosis not present

## 2017-10-18 DIAGNOSIS — D509 Iron deficiency anemia, unspecified: Secondary | ICD-10-CM | POA: Diagnosis not present

## 2017-10-18 DIAGNOSIS — D631 Anemia in chronic kidney disease: Secondary | ICD-10-CM | POA: Diagnosis not present

## 2017-10-18 DIAGNOSIS — N186 End stage renal disease: Secondary | ICD-10-CM | POA: Diagnosis not present

## 2017-10-18 DIAGNOSIS — N2581 Secondary hyperparathyroidism of renal origin: Secondary | ICD-10-CM | POA: Diagnosis not present

## 2017-10-20 DIAGNOSIS — D509 Iron deficiency anemia, unspecified: Secondary | ICD-10-CM | POA: Diagnosis not present

## 2017-10-20 DIAGNOSIS — N186 End stage renal disease: Secondary | ICD-10-CM | POA: Diagnosis not present

## 2017-10-20 DIAGNOSIS — Z992 Dependence on renal dialysis: Secondary | ICD-10-CM | POA: Diagnosis not present

## 2017-10-20 DIAGNOSIS — D631 Anemia in chronic kidney disease: Secondary | ICD-10-CM | POA: Diagnosis not present

## 2017-10-20 DIAGNOSIS — N2581 Secondary hyperparathyroidism of renal origin: Secondary | ICD-10-CM | POA: Diagnosis not present

## 2017-10-23 DIAGNOSIS — D509 Iron deficiency anemia, unspecified: Secondary | ICD-10-CM | POA: Diagnosis not present

## 2017-10-23 DIAGNOSIS — D631 Anemia in chronic kidney disease: Secondary | ICD-10-CM | POA: Diagnosis not present

## 2017-10-23 DIAGNOSIS — N186 End stage renal disease: Secondary | ICD-10-CM | POA: Diagnosis not present

## 2017-10-23 DIAGNOSIS — Z992 Dependence on renal dialysis: Secondary | ICD-10-CM | POA: Diagnosis not present

## 2017-10-23 DIAGNOSIS — N2581 Secondary hyperparathyroidism of renal origin: Secondary | ICD-10-CM | POA: Diagnosis not present

## 2017-10-24 ENCOUNTER — Encounter: Payer: Self-pay | Admitting: Pediatrics

## 2017-10-24 ENCOUNTER — Ambulatory Visit (INDEPENDENT_AMBULATORY_CARE_PROVIDER_SITE_OTHER): Payer: Medicare Other | Admitting: Pediatrics

## 2017-10-24 VITALS — BP 122/66 | HR 55 | Temp 97.2°F | Ht 63.74 in | Wt 173.2 lb

## 2017-10-24 DIAGNOSIS — I5032 Chronic diastolic (congestive) heart failure: Secondary | ICD-10-CM | POA: Diagnosis not present

## 2017-10-24 DIAGNOSIS — E1121 Type 2 diabetes mellitus with diabetic nephropathy: Secondary | ICD-10-CM

## 2017-10-24 DIAGNOSIS — J302 Other seasonal allergic rhinitis: Secondary | ICD-10-CM

## 2017-10-24 DIAGNOSIS — I25119 Atherosclerotic heart disease of native coronary artery with unspecified angina pectoris: Secondary | ICD-10-CM

## 2017-10-24 DIAGNOSIS — F419 Anxiety disorder, unspecified: Secondary | ICD-10-CM | POA: Diagnosis not present

## 2017-10-24 LAB — BAYER DCA HB A1C WAIVED: HB A1C (BAYER DCA - WAIVED): 6 % (ref <7.0)

## 2017-10-24 MED ORDER — ESCITALOPRAM OXALATE 10 MG PO TABS: 10.0000 mg | ORAL_TABLET | Freq: Every day | ORAL | 3 refills | Status: DC

## 2017-10-24 MED ORDER — INSULIN GLARGINE 100 UNIT/ML SOLOSTAR PEN: 15.0000 [IU] | PEN_INJECTOR | Freq: Every day | SUBCUTANEOUS | 3 refills | Status: DC

## 2017-10-24 MED ORDER — LORAZEPAM 0.5 MG PO TABS: 0.5000 mg | ORAL_TABLET | Freq: Three times a day (TID) | ORAL | 1 refills | Status: DC | PRN

## 2017-10-24 NOTE — Progress Notes (Signed)
  Subjective:   Patient ID: [name], female    DOB: 1951-01-01, 66 y.o.   MRN: [id number] CC: Follow-up (3 month) multiple med problems  HPI: [name] is a 66 y.o. female presenting for Follow-up (3 month)  ESRD: continues dialysis 3 days a week  DM2: low 100s AM BGLs Taking insulin regularly  HTN: taking meds regulalry Has had both low and high BPs recently in dialysis sessions  Has had HA most days on the  top of her head, improves with tylenol Doesn't keep her from doing the things that she wants to do  CHF: no recent swelling in LE, no SOB  Still bothered off and on by pain in L side Has had CT scan last month with no cause for pain at [facility] stooling most days, taking bowel regimen  Has lung nodule needs repeat CT scan Spring 2019  Anxiety: taking ativan less than 3 times a week, sometimes doesn't take it at all in a week Taking lexapro regularly, thinks is helping  Relevant past medical, surgical, family and social history reviewed. Allergies and medications reviewed and updated. Social History   Tobacco Use  Smoking Status Former Smoker  . Packs/day: 1.00  . Years: 30.00  . Pack years: 30.00  . Last attempt to quit: 12/04/2001  . Years since quitting: 15.9  Smokeless Tobacco Never Used   ROS: Per HPI   Objective:    BP 122/66   Pulse (!) 55   Temp (!) 97.2 F (36.2 C) (Oral)   Ht 5' 3.74" (1.619 m)   Wt 173 lb 3.2 oz (78.6 kg)   BMI 29.97 kg/m   Wt Readings from Last 3 Encounters:  10/24/17 173 lb 3.2 oz (78.6 kg)  09/26/17 171 lb 6.4 oz (77.7 kg)  09/17/17 175 lb (79.4 kg)    Gen: NAD, alert, cooperative with exam, NCAT EYES: EOMI, no conjunctival injection, or no icterus ENT:   OP without erythema LYMPH: no cervical LAD CV: NRRR, normal S1/S2, no murmur, distal pulses 2+ b/l Resp: CTABL, no wheezes, normal WOB Abd: +BS, soft, mildly tender with deep palpation LUQ, ND. no guarding or organomegaly Ext: No edema, warm Neuro:  Alert and oriented, strength equal b/l UE and LE, coordination grossly normal MSK: normal muscle bulk  Assessment & Plan:  [name] was seen today for follow-up med problems  Diagnoses and all orders for this visit:  Chronic diastolic CHF (congestive heart failure) (HCC) Stable, BP sometimes elevated dialysis days, has to skip BP meds that day bc sometimes BP too low  Type II diabetes mellitus with nephropathy (HCC) A1c 6 today Cont insulin 15 at night Any lows let me know -     Bayer DCA Hb A1c Waived -     Insulin Glargine (LANTUS SOLOSTAR) 100 UNIT/ML Solostar Pen; Inject 15-25 Units into the skin daily at 10 pm.  Anxiety Stable, cont below, ativan prn, if more than 2-3 times a week let me know -     escitalopram (LEXAPRO) 10 MG tablet; Take 1 tablet (10 mg total) by mouth daily. -     LORazepam (ATIVAN) 0.5 MG tablet; Take 1 tablet (0.5 mg total) by mouth every 8 (eight) hours as needed for anxiety.  Seasonal allergic rhinitis Cont flonase  Follow up plan: Return in about 3 months (around 01/24/2018). [name], MD [place]

## 2017-10-25 DIAGNOSIS — D509 Iron deficiency anemia, unspecified: Secondary | ICD-10-CM | POA: Diagnosis not present

## 2017-10-25 DIAGNOSIS — N2581 Secondary hyperparathyroidism of renal origin: Secondary | ICD-10-CM | POA: Diagnosis not present

## 2017-10-25 DIAGNOSIS — Z992 Dependence on renal dialysis: Secondary | ICD-10-CM | POA: Diagnosis not present

## 2017-10-25 DIAGNOSIS — N186 End stage renal disease: Secondary | ICD-10-CM | POA: Diagnosis not present

## 2017-10-25 DIAGNOSIS — D631 Anemia in chronic kidney disease: Secondary | ICD-10-CM | POA: Diagnosis not present

## 2017-10-26 ENCOUNTER — Encounter: Payer: Self-pay | Admitting: Pediatrics

## 2017-10-27 DIAGNOSIS — Z992 Dependence on renal dialysis: Secondary | ICD-10-CM | POA: Diagnosis not present

## 2017-10-27 DIAGNOSIS — D509 Iron deficiency anemia, unspecified: Secondary | ICD-10-CM | POA: Diagnosis not present

## 2017-10-27 DIAGNOSIS — D631 Anemia in chronic kidney disease: Secondary | ICD-10-CM | POA: Diagnosis not present

## 2017-10-27 DIAGNOSIS — N2581 Secondary hyperparathyroidism of renal origin: Secondary | ICD-10-CM | POA: Diagnosis not present

## 2017-10-27 DIAGNOSIS — N186 End stage renal disease: Secondary | ICD-10-CM | POA: Diagnosis not present

## 2017-10-30 DIAGNOSIS — D509 Iron deficiency anemia, unspecified: Secondary | ICD-10-CM | POA: Diagnosis not present

## 2017-10-30 DIAGNOSIS — Z992 Dependence on renal dialysis: Secondary | ICD-10-CM | POA: Diagnosis not present

## 2017-10-30 DIAGNOSIS — N2581 Secondary hyperparathyroidism of renal origin: Secondary | ICD-10-CM | POA: Diagnosis not present

## 2017-10-30 DIAGNOSIS — N186 End stage renal disease: Secondary | ICD-10-CM | POA: Diagnosis not present

## 2017-10-30 DIAGNOSIS — D631 Anemia in chronic kidney disease: Secondary | ICD-10-CM | POA: Diagnosis not present

## 2017-11-01 DIAGNOSIS — Z992 Dependence on renal dialysis: Secondary | ICD-10-CM | POA: Diagnosis not present

## 2017-11-01 DIAGNOSIS — N186 End stage renal disease: Secondary | ICD-10-CM | POA: Diagnosis not present

## 2017-11-01 DIAGNOSIS — D509 Iron deficiency anemia, unspecified: Secondary | ICD-10-CM | POA: Diagnosis not present

## 2017-11-01 DIAGNOSIS — D631 Anemia in chronic kidney disease: Secondary | ICD-10-CM | POA: Diagnosis not present

## 2017-11-01 DIAGNOSIS — N2581 Secondary hyperparathyroidism of renal origin: Secondary | ICD-10-CM | POA: Diagnosis not present

## 2017-11-02 DIAGNOSIS — N186 End stage renal disease: Secondary | ICD-10-CM | POA: Diagnosis not present

## 2017-11-02 DIAGNOSIS — Z992 Dependence on renal dialysis: Secondary | ICD-10-CM | POA: Diagnosis not present

## 2017-11-03 DIAGNOSIS — N2581 Secondary hyperparathyroidism of renal origin: Secondary | ICD-10-CM | POA: Diagnosis not present

## 2017-11-03 DIAGNOSIS — Z992 Dependence on renal dialysis: Secondary | ICD-10-CM | POA: Diagnosis not present

## 2017-11-03 DIAGNOSIS — D509 Iron deficiency anemia, unspecified: Secondary | ICD-10-CM | POA: Diagnosis not present

## 2017-11-03 DIAGNOSIS — D631 Anemia in chronic kidney disease: Secondary | ICD-10-CM | POA: Diagnosis not present

## 2017-11-03 DIAGNOSIS — N186 End stage renal disease: Secondary | ICD-10-CM | POA: Diagnosis not present

## 2017-11-06 DIAGNOSIS — N186 End stage renal disease: Secondary | ICD-10-CM | POA: Diagnosis not present

## 2017-11-06 DIAGNOSIS — Z992 Dependence on renal dialysis: Secondary | ICD-10-CM | POA: Diagnosis not present

## 2017-11-06 DIAGNOSIS — N2581 Secondary hyperparathyroidism of renal origin: Secondary | ICD-10-CM | POA: Diagnosis not present

## 2017-11-06 DIAGNOSIS — D509 Iron deficiency anemia, unspecified: Secondary | ICD-10-CM | POA: Diagnosis not present

## 2017-11-06 DIAGNOSIS — D631 Anemia in chronic kidney disease: Secondary | ICD-10-CM | POA: Diagnosis not present

## 2017-11-08 DIAGNOSIS — D509 Iron deficiency anemia, unspecified: Secondary | ICD-10-CM | POA: Diagnosis not present

## 2017-11-08 DIAGNOSIS — Z992 Dependence on renal dialysis: Secondary | ICD-10-CM | POA: Diagnosis not present

## 2017-11-08 DIAGNOSIS — N2581 Secondary hyperparathyroidism of renal origin: Secondary | ICD-10-CM | POA: Diagnosis not present

## 2017-11-08 DIAGNOSIS — D631 Anemia in chronic kidney disease: Secondary | ICD-10-CM | POA: Diagnosis not present

## 2017-11-08 DIAGNOSIS — N186 End stage renal disease: Secondary | ICD-10-CM | POA: Diagnosis not present

## 2017-11-09 ENCOUNTER — Encounter: Payer: Self-pay | Admitting: *Deleted

## 2017-11-10 DIAGNOSIS — D509 Iron deficiency anemia, unspecified: Secondary | ICD-10-CM | POA: Diagnosis not present

## 2017-11-10 DIAGNOSIS — D631 Anemia in chronic kidney disease: Secondary | ICD-10-CM | POA: Diagnosis not present

## 2017-11-10 DIAGNOSIS — N2581 Secondary hyperparathyroidism of renal origin: Secondary | ICD-10-CM | POA: Diagnosis not present

## 2017-11-10 DIAGNOSIS — N186 End stage renal disease: Secondary | ICD-10-CM | POA: Diagnosis not present

## 2017-11-10 DIAGNOSIS — Z992 Dependence on renal dialysis: Secondary | ICD-10-CM | POA: Diagnosis not present

## 2017-11-13 DIAGNOSIS — Z992 Dependence on renal dialysis: Secondary | ICD-10-CM | POA: Diagnosis not present

## 2017-11-13 DIAGNOSIS — D509 Iron deficiency anemia, unspecified: Secondary | ICD-10-CM | POA: Diagnosis not present

## 2017-11-13 DIAGNOSIS — N186 End stage renal disease: Secondary | ICD-10-CM | POA: Diagnosis not present

## 2017-11-13 DIAGNOSIS — N2581 Secondary hyperparathyroidism of renal origin: Secondary | ICD-10-CM | POA: Diagnosis not present

## 2017-11-13 DIAGNOSIS — D631 Anemia in chronic kidney disease: Secondary | ICD-10-CM | POA: Diagnosis not present

## 2017-11-15 ENCOUNTER — Other Ambulatory Visit: Payer: Self-pay | Admitting: Pediatrics

## 2017-11-15 DIAGNOSIS — D509 Iron deficiency anemia, unspecified: Secondary | ICD-10-CM | POA: Diagnosis not present

## 2017-11-15 DIAGNOSIS — Z992 Dependence on renal dialysis: Secondary | ICD-10-CM | POA: Diagnosis not present

## 2017-11-15 DIAGNOSIS — D631 Anemia in chronic kidney disease: Secondary | ICD-10-CM | POA: Diagnosis not present

## 2017-11-15 DIAGNOSIS — N186 End stage renal disease: Secondary | ICD-10-CM | POA: Diagnosis not present

## 2017-11-15 DIAGNOSIS — N2581 Secondary hyperparathyroidism of renal origin: Secondary | ICD-10-CM | POA: Diagnosis not present

## 2017-11-16 DIAGNOSIS — Z1231 Encounter for screening mammogram for malignant neoplasm of breast: Secondary | ICD-10-CM | POA: Diagnosis not present

## 2017-11-17 DIAGNOSIS — N2581 Secondary hyperparathyroidism of renal origin: Secondary | ICD-10-CM | POA: Diagnosis not present

## 2017-11-17 DIAGNOSIS — D509 Iron deficiency anemia, unspecified: Secondary | ICD-10-CM | POA: Diagnosis not present

## 2017-11-17 DIAGNOSIS — D631 Anemia in chronic kidney disease: Secondary | ICD-10-CM | POA: Diagnosis not present

## 2017-11-17 DIAGNOSIS — Z992 Dependence on renal dialysis: Secondary | ICD-10-CM | POA: Diagnosis not present

## 2017-11-17 DIAGNOSIS — N186 End stage renal disease: Secondary | ICD-10-CM | POA: Diagnosis not present

## 2017-11-20 DIAGNOSIS — D631 Anemia in chronic kidney disease: Secondary | ICD-10-CM | POA: Diagnosis not present

## 2017-11-20 DIAGNOSIS — D509 Iron deficiency anemia, unspecified: Secondary | ICD-10-CM | POA: Diagnosis not present

## 2017-11-20 DIAGNOSIS — E119 Type 2 diabetes mellitus without complications: Secondary | ICD-10-CM | POA: Diagnosis not present

## 2017-11-20 DIAGNOSIS — Z992 Dependence on renal dialysis: Secondary | ICD-10-CM | POA: Diagnosis not present

## 2017-11-20 DIAGNOSIS — Z794 Long term (current) use of insulin: Secondary | ICD-10-CM | POA: Diagnosis not present

## 2017-11-20 DIAGNOSIS — N2581 Secondary hyperparathyroidism of renal origin: Secondary | ICD-10-CM | POA: Diagnosis not present

## 2017-11-20 DIAGNOSIS — N186 End stage renal disease: Secondary | ICD-10-CM | POA: Diagnosis not present

## 2017-11-22 DIAGNOSIS — D509 Iron deficiency anemia, unspecified: Secondary | ICD-10-CM | POA: Diagnosis not present

## 2017-11-22 DIAGNOSIS — N186 End stage renal disease: Secondary | ICD-10-CM | POA: Diagnosis not present

## 2017-11-22 DIAGNOSIS — Z992 Dependence on renal dialysis: Secondary | ICD-10-CM | POA: Diagnosis not present

## 2017-11-22 DIAGNOSIS — D631 Anemia in chronic kidney disease: Secondary | ICD-10-CM | POA: Diagnosis not present

## 2017-11-22 DIAGNOSIS — N2581 Secondary hyperparathyroidism of renal origin: Secondary | ICD-10-CM | POA: Diagnosis not present

## 2017-11-24 DIAGNOSIS — D509 Iron deficiency anemia, unspecified: Secondary | ICD-10-CM | POA: Diagnosis not present

## 2017-11-24 DIAGNOSIS — Z992 Dependence on renal dialysis: Secondary | ICD-10-CM | POA: Diagnosis not present

## 2017-11-24 DIAGNOSIS — N186 End stage renal disease: Secondary | ICD-10-CM | POA: Diagnosis not present

## 2017-11-24 DIAGNOSIS — D631 Anemia in chronic kidney disease: Secondary | ICD-10-CM | POA: Diagnosis not present

## 2017-11-24 DIAGNOSIS — N2581 Secondary hyperparathyroidism of renal origin: Secondary | ICD-10-CM | POA: Diagnosis not present

## 2017-11-26 DIAGNOSIS — Z992 Dependence on renal dialysis: Secondary | ICD-10-CM | POA: Diagnosis not present

## 2017-11-26 DIAGNOSIS — D509 Iron deficiency anemia, unspecified: Secondary | ICD-10-CM | POA: Diagnosis not present

## 2017-11-26 DIAGNOSIS — D631 Anemia in chronic kidney disease: Secondary | ICD-10-CM | POA: Diagnosis not present

## 2017-11-26 DIAGNOSIS — N186 End stage renal disease: Secondary | ICD-10-CM | POA: Diagnosis not present

## 2017-11-26 DIAGNOSIS — N2581 Secondary hyperparathyroidism of renal origin: Secondary | ICD-10-CM | POA: Diagnosis not present

## 2017-11-29 DIAGNOSIS — N186 End stage renal disease: Secondary | ICD-10-CM | POA: Diagnosis not present

## 2017-11-29 DIAGNOSIS — D509 Iron deficiency anemia, unspecified: Secondary | ICD-10-CM | POA: Diagnosis not present

## 2017-11-29 DIAGNOSIS — N2581 Secondary hyperparathyroidism of renal origin: Secondary | ICD-10-CM | POA: Diagnosis not present

## 2017-11-29 DIAGNOSIS — Z992 Dependence on renal dialysis: Secondary | ICD-10-CM | POA: Diagnosis not present

## 2017-11-29 DIAGNOSIS — D631 Anemia in chronic kidney disease: Secondary | ICD-10-CM | POA: Diagnosis not present

## 2017-12-01 DIAGNOSIS — N186 End stage renal disease: Secondary | ICD-10-CM | POA: Diagnosis not present

## 2017-12-01 DIAGNOSIS — Z992 Dependence on renal dialysis: Secondary | ICD-10-CM | POA: Diagnosis not present

## 2017-12-01 DIAGNOSIS — N2581 Secondary hyperparathyroidism of renal origin: Secondary | ICD-10-CM | POA: Diagnosis not present

## 2017-12-01 DIAGNOSIS — D509 Iron deficiency anemia, unspecified: Secondary | ICD-10-CM | POA: Diagnosis not present

## 2017-12-01 DIAGNOSIS — D631 Anemia in chronic kidney disease: Secondary | ICD-10-CM | POA: Diagnosis not present

## 2017-12-03 DIAGNOSIS — D631 Anemia in chronic kidney disease: Secondary | ICD-10-CM | POA: Diagnosis not present

## 2017-12-03 DIAGNOSIS — N186 End stage renal disease: Secondary | ICD-10-CM | POA: Diagnosis not present

## 2017-12-03 DIAGNOSIS — N2581 Secondary hyperparathyroidism of renal origin: Secondary | ICD-10-CM | POA: Diagnosis not present

## 2017-12-03 DIAGNOSIS — D509 Iron deficiency anemia, unspecified: Secondary | ICD-10-CM | POA: Diagnosis not present

## 2017-12-03 DIAGNOSIS — Z992 Dependence on renal dialysis: Secondary | ICD-10-CM | POA: Diagnosis not present

## 2017-12-06 DIAGNOSIS — Z992 Dependence on renal dialysis: Secondary | ICD-10-CM | POA: Diagnosis not present

## 2017-12-06 DIAGNOSIS — N2581 Secondary hyperparathyroidism of renal origin: Secondary | ICD-10-CM | POA: Diagnosis not present

## 2017-12-06 DIAGNOSIS — D631 Anemia in chronic kidney disease: Secondary | ICD-10-CM | POA: Diagnosis not present

## 2017-12-06 DIAGNOSIS — N186 End stage renal disease: Secondary | ICD-10-CM | POA: Diagnosis not present

## 2017-12-06 DIAGNOSIS — D509 Iron deficiency anemia, unspecified: Secondary | ICD-10-CM | POA: Diagnosis not present

## 2017-12-08 DIAGNOSIS — Z992 Dependence on renal dialysis: Secondary | ICD-10-CM | POA: Diagnosis not present

## 2017-12-08 DIAGNOSIS — D631 Anemia in chronic kidney disease: Secondary | ICD-10-CM | POA: Diagnosis not present

## 2017-12-08 DIAGNOSIS — N2581 Secondary hyperparathyroidism of renal origin: Secondary | ICD-10-CM | POA: Diagnosis not present

## 2017-12-08 DIAGNOSIS — D509 Iron deficiency anemia, unspecified: Secondary | ICD-10-CM | POA: Diagnosis not present

## 2017-12-08 DIAGNOSIS — N186 End stage renal disease: Secondary | ICD-10-CM | POA: Diagnosis not present

## 2017-12-11 DIAGNOSIS — N186 End stage renal disease: Secondary | ICD-10-CM | POA: Diagnosis not present

## 2017-12-11 DIAGNOSIS — D631 Anemia in chronic kidney disease: Secondary | ICD-10-CM | POA: Diagnosis not present

## 2017-12-11 DIAGNOSIS — Z992 Dependence on renal dialysis: Secondary | ICD-10-CM | POA: Diagnosis not present

## 2017-12-11 DIAGNOSIS — D509 Iron deficiency anemia, unspecified: Secondary | ICD-10-CM | POA: Diagnosis not present

## 2017-12-11 DIAGNOSIS — N2581 Secondary hyperparathyroidism of renal origin: Secondary | ICD-10-CM | POA: Diagnosis not present

## 2017-12-13 DIAGNOSIS — D631 Anemia in chronic kidney disease: Secondary | ICD-10-CM | POA: Diagnosis not present

## 2017-12-13 DIAGNOSIS — N186 End stage renal disease: Secondary | ICD-10-CM | POA: Diagnosis not present

## 2017-12-13 DIAGNOSIS — D509 Iron deficiency anemia, unspecified: Secondary | ICD-10-CM | POA: Diagnosis not present

## 2017-12-13 DIAGNOSIS — N2581 Secondary hyperparathyroidism of renal origin: Secondary | ICD-10-CM | POA: Diagnosis not present

## 2017-12-13 DIAGNOSIS — Z992 Dependence on renal dialysis: Secondary | ICD-10-CM | POA: Diagnosis not present

## 2017-12-14 ENCOUNTER — Ambulatory Visit (INDEPENDENT_AMBULATORY_CARE_PROVIDER_SITE_OTHER): Payer: Medicare Other | Admitting: Urology

## 2017-12-14 ENCOUNTER — Other Ambulatory Visit: Payer: Self-pay | Admitting: Pediatrics

## 2017-12-14 DIAGNOSIS — M5135 Other intervertebral disc degeneration, thoracolumbar region: Secondary | ICD-10-CM | POA: Diagnosis not present

## 2017-12-14 DIAGNOSIS — R1084 Generalized abdominal pain: Secondary | ICD-10-CM | POA: Diagnosis not present

## 2017-12-14 DIAGNOSIS — R3915 Urgency of urination: Secondary | ICD-10-CM | POA: Diagnosis not present

## 2017-12-15 DIAGNOSIS — D631 Anemia in chronic kidney disease: Secondary | ICD-10-CM | POA: Diagnosis not present

## 2017-12-15 DIAGNOSIS — N186 End stage renal disease: Secondary | ICD-10-CM | POA: Diagnosis not present

## 2017-12-15 DIAGNOSIS — N2581 Secondary hyperparathyroidism of renal origin: Secondary | ICD-10-CM | POA: Diagnosis not present

## 2017-12-15 DIAGNOSIS — Z992 Dependence on renal dialysis: Secondary | ICD-10-CM | POA: Diagnosis not present

## 2017-12-15 DIAGNOSIS — D509 Iron deficiency anemia, unspecified: Secondary | ICD-10-CM | POA: Diagnosis not present

## 2017-12-18 DIAGNOSIS — D631 Anemia in chronic kidney disease: Secondary | ICD-10-CM | POA: Diagnosis not present

## 2017-12-18 DIAGNOSIS — N2581 Secondary hyperparathyroidism of renal origin: Secondary | ICD-10-CM | POA: Diagnosis not present

## 2017-12-18 DIAGNOSIS — Z992 Dependence on renal dialysis: Secondary | ICD-10-CM | POA: Diagnosis not present

## 2017-12-18 DIAGNOSIS — D509 Iron deficiency anemia, unspecified: Secondary | ICD-10-CM | POA: Diagnosis not present

## 2017-12-18 DIAGNOSIS — N186 End stage renal disease: Secondary | ICD-10-CM | POA: Diagnosis not present

## 2017-12-19 ENCOUNTER — Ambulatory Visit: Payer: Medicare Other | Admitting: Pediatrics

## 2017-12-20 DIAGNOSIS — N2581 Secondary hyperparathyroidism of renal origin: Secondary | ICD-10-CM | POA: Diagnosis not present

## 2017-12-20 DIAGNOSIS — D509 Iron deficiency anemia, unspecified: Secondary | ICD-10-CM | POA: Diagnosis not present

## 2017-12-20 DIAGNOSIS — N186 End stage renal disease: Secondary | ICD-10-CM | POA: Diagnosis not present

## 2017-12-20 DIAGNOSIS — D631 Anemia in chronic kidney disease: Secondary | ICD-10-CM | POA: Diagnosis not present

## 2017-12-20 DIAGNOSIS — Z992 Dependence on renal dialysis: Secondary | ICD-10-CM | POA: Diagnosis not present

## 2017-12-22 DIAGNOSIS — N186 End stage renal disease: Secondary | ICD-10-CM | POA: Diagnosis not present

## 2017-12-22 DIAGNOSIS — D631 Anemia in chronic kidney disease: Secondary | ICD-10-CM | POA: Diagnosis not present

## 2017-12-22 DIAGNOSIS — Z992 Dependence on renal dialysis: Secondary | ICD-10-CM | POA: Diagnosis not present

## 2017-12-22 DIAGNOSIS — N2581 Secondary hyperparathyroidism of renal origin: Secondary | ICD-10-CM | POA: Diagnosis not present

## 2017-12-22 DIAGNOSIS — D509 Iron deficiency anemia, unspecified: Secondary | ICD-10-CM | POA: Diagnosis not present

## 2017-12-25 DIAGNOSIS — N186 End stage renal disease: Secondary | ICD-10-CM | POA: Diagnosis not present

## 2017-12-25 DIAGNOSIS — D631 Anemia in chronic kidney disease: Secondary | ICD-10-CM | POA: Diagnosis not present

## 2017-12-25 DIAGNOSIS — D509 Iron deficiency anemia, unspecified: Secondary | ICD-10-CM | POA: Diagnosis not present

## 2017-12-25 DIAGNOSIS — N2581 Secondary hyperparathyroidism of renal origin: Secondary | ICD-10-CM | POA: Diagnosis not present

## 2017-12-25 DIAGNOSIS — Z992 Dependence on renal dialysis: Secondary | ICD-10-CM | POA: Diagnosis not present

## 2017-12-27 DIAGNOSIS — Z992 Dependence on renal dialysis: Secondary | ICD-10-CM | POA: Diagnosis not present

## 2017-12-27 DIAGNOSIS — N2581 Secondary hyperparathyroidism of renal origin: Secondary | ICD-10-CM | POA: Diagnosis not present

## 2017-12-27 DIAGNOSIS — N186 End stage renal disease: Secondary | ICD-10-CM | POA: Diagnosis not present

## 2017-12-27 DIAGNOSIS — D631 Anemia in chronic kidney disease: Secondary | ICD-10-CM | POA: Diagnosis not present

## 2017-12-27 DIAGNOSIS — D509 Iron deficiency anemia, unspecified: Secondary | ICD-10-CM | POA: Diagnosis not present

## 2017-12-29 DIAGNOSIS — N186 End stage renal disease: Secondary | ICD-10-CM | POA: Diagnosis not present

## 2017-12-29 DIAGNOSIS — N2581 Secondary hyperparathyroidism of renal origin: Secondary | ICD-10-CM | POA: Diagnosis not present

## 2017-12-29 DIAGNOSIS — Z992 Dependence on renal dialysis: Secondary | ICD-10-CM | POA: Diagnosis not present

## 2017-12-29 DIAGNOSIS — D509 Iron deficiency anemia, unspecified: Secondary | ICD-10-CM | POA: Diagnosis not present

## 2017-12-29 DIAGNOSIS — D631 Anemia in chronic kidney disease: Secondary | ICD-10-CM | POA: Diagnosis not present

## 2018-01-01 DIAGNOSIS — D509 Iron deficiency anemia, unspecified: Secondary | ICD-10-CM | POA: Diagnosis not present

## 2018-01-01 DIAGNOSIS — N186 End stage renal disease: Secondary | ICD-10-CM | POA: Diagnosis not present

## 2018-01-01 DIAGNOSIS — N2581 Secondary hyperparathyroidism of renal origin: Secondary | ICD-10-CM | POA: Diagnosis not present

## 2018-01-01 DIAGNOSIS — Z992 Dependence on renal dialysis: Secondary | ICD-10-CM | POA: Diagnosis not present

## 2018-01-01 DIAGNOSIS — D631 Anemia in chronic kidney disease: Secondary | ICD-10-CM | POA: Diagnosis not present

## 2018-01-03 DIAGNOSIS — N2581 Secondary hyperparathyroidism of renal origin: Secondary | ICD-10-CM | POA: Diagnosis not present

## 2018-01-03 DIAGNOSIS — Z992 Dependence on renal dialysis: Secondary | ICD-10-CM | POA: Diagnosis not present

## 2018-01-03 DIAGNOSIS — D631 Anemia in chronic kidney disease: Secondary | ICD-10-CM | POA: Diagnosis not present

## 2018-01-03 DIAGNOSIS — D509 Iron deficiency anemia, unspecified: Secondary | ICD-10-CM | POA: Diagnosis not present

## 2018-01-03 DIAGNOSIS — N186 End stage renal disease: Secondary | ICD-10-CM | POA: Diagnosis not present

## 2018-01-05 DIAGNOSIS — N2581 Secondary hyperparathyroidism of renal origin: Secondary | ICD-10-CM | POA: Diagnosis not present

## 2018-01-05 DIAGNOSIS — N186 End stage renal disease: Secondary | ICD-10-CM | POA: Diagnosis not present

## 2018-01-05 DIAGNOSIS — D631 Anemia in chronic kidney disease: Secondary | ICD-10-CM | POA: Diagnosis not present

## 2018-01-05 DIAGNOSIS — Z992 Dependence on renal dialysis: Secondary | ICD-10-CM | POA: Diagnosis not present

## 2018-01-05 DIAGNOSIS — D509 Iron deficiency anemia, unspecified: Secondary | ICD-10-CM | POA: Diagnosis not present

## 2018-01-08 DIAGNOSIS — D631 Anemia in chronic kidney disease: Secondary | ICD-10-CM | POA: Diagnosis not present

## 2018-01-08 DIAGNOSIS — Z992 Dependence on renal dialysis: Secondary | ICD-10-CM | POA: Diagnosis not present

## 2018-01-08 DIAGNOSIS — D509 Iron deficiency anemia, unspecified: Secondary | ICD-10-CM | POA: Diagnosis not present

## 2018-01-08 DIAGNOSIS — N186 End stage renal disease: Secondary | ICD-10-CM | POA: Diagnosis not present

## 2018-01-08 DIAGNOSIS — N2581 Secondary hyperparathyroidism of renal origin: Secondary | ICD-10-CM | POA: Diagnosis not present

## 2018-01-09 ENCOUNTER — Ambulatory Visit (INDEPENDENT_AMBULATORY_CARE_PROVIDER_SITE_OTHER): Payer: Medicare Other

## 2018-01-09 ENCOUNTER — Encounter: Payer: Self-pay | Admitting: Pediatrics

## 2018-01-09 ENCOUNTER — Ambulatory Visit (INDEPENDENT_AMBULATORY_CARE_PROVIDER_SITE_OTHER): Payer: Medicare Other | Admitting: Pediatrics

## 2018-01-09 VITALS — BP 137/72 | HR 56 | Temp 97.8°F | Ht 63.74 in | Wt 179.4 lb

## 2018-01-09 DIAGNOSIS — M546 Pain in thoracic spine: Secondary | ICD-10-CM

## 2018-01-09 DIAGNOSIS — G8929 Other chronic pain: Secondary | ICD-10-CM | POA: Diagnosis not present

## 2018-01-09 DIAGNOSIS — M542 Cervicalgia: Secondary | ICD-10-CM | POA: Diagnosis not present

## 2018-01-09 MED ORDER — CYCLOBENZAPRINE HCL 5 MG PO TABS: 5.0000 mg | ORAL_TABLET | Freq: Three times a day (TID) | ORAL | 0 refills | Status: DC | PRN

## 2018-01-09 MED ORDER — PREDNISONE 10 MG (21) PO TBPK: ORAL_TABLET | Freq: Every day | ORAL | 0 refills | Status: DC

## 2018-01-09 NOTE — Progress Notes (Signed)
  Subjective:   Patient ID: [name], female    DOB: [birth year], 67 y.o.   MRN: [id number] CC: Neck Pain and hand tingling  HPI: [name] is a 67 y.o. female presenting for Neck Pain   Woke up two days ago with neck pain, not able to turn head either R or L without pain. Also having intermittent hand tingling. All of hands tingling, not some fingers more than others. Arms feel tired, doesn't have the strength as usual, give out easily such as when brushing hair. Not dropping anything form her hands, no weakness.  No fevers. Has had surgery in her neck in the past. No injury that she knows of.   Relevant past medical, surgical, family and social history reviewed. Allergies and medications reviewed and updated. Social History   Tobacco Use  Smoking Status Former Smoker  . Packs/day: 1.00  . Years: 30.00  . Pack years: 30.00  . Last attempt to quit: 12/04/2001  . Years since quitting: 16.1  Smokeless Tobacco Never Used   ROS: Per HPI   Objective:    BP 137/72   Pulse (!) 56   Temp 97.8 F (36.6 C) (Oral)   Ht 5' 3.74" (1.619 m)   Wt 179 lb 6.4 oz (81.4 kg)   BMI 31.05 kg/m   Wt Readings from Last 3 Encounters:  01/09/18 179 lb 6.4 oz (81.4 kg)  10/24/17 173 lb 3.2 oz (78.6 kg)  09/26/17 171 lb 6.4 oz (77.7 kg)    Gen: NAD, alert, cooperative with exam, NCAT EYES: EOMI, no conjunctival injection, or no icterus ENT:  TMs pearly gray b/l, OP without erythema LYMPH: no cervical LAD CV: NRRR, normal S1/S2 Resp: CTABL, no wheezes, normal WOB Ext: No edema, warm Neuro: Alert and oriented, nl sensation b/l UE. 5/5 hand grip b/l, shoulder abduction and adduction, elbow flex/ext MSK: decreased ROM, not able to turn head to L, about 10 degrees to the R. Limited tilting head L and R. Pain L side of neck in muscles with tucking chin. ttp along posterior neck muscles and soft tissue medial to scapula. Normal ROM shoulders b/l.   Assessment & Plan:  [name] was seen today  for neck pain.   Diagnoses and all orders for this visit:  Cervicalgia Prednisone, flexeril, gentle neck ROM. Will raise BGLs. Have been well controlled. Pt to follow BGLs, let me know if >250. -     predniSONE (STERAPRED UNI-PAK 21 TAB) 10 MG (21) TBPK tablet; Take by mouth daily. As directed x 6 days  Chronic left-sided thoracic back pain Trial of below, discussed will cause sleepiness do not take and drive. -     cyclobenzaprine (FLEXERIL) 5 MG tablet; Take 1 tablet (5 mg total) by mouth 3 (three) times daily as needed for muscle spasms.  Neck pain -     DG Cervical Spine Complete; Future  Follow up plan: Return in about 2 weeks (around 01/23/2018). [name], MD [place]

## 2018-01-10 DIAGNOSIS — N186 End stage renal disease: Secondary | ICD-10-CM | POA: Diagnosis not present

## 2018-01-10 DIAGNOSIS — N2581 Secondary hyperparathyroidism of renal origin: Secondary | ICD-10-CM | POA: Diagnosis not present

## 2018-01-10 DIAGNOSIS — D509 Iron deficiency anemia, unspecified: Secondary | ICD-10-CM | POA: Diagnosis not present

## 2018-01-10 DIAGNOSIS — Z992 Dependence on renal dialysis: Secondary | ICD-10-CM | POA: Diagnosis not present

## 2018-01-10 DIAGNOSIS — D631 Anemia in chronic kidney disease: Secondary | ICD-10-CM | POA: Diagnosis not present

## 2018-01-12 DIAGNOSIS — N186 End stage renal disease: Secondary | ICD-10-CM | POA: Diagnosis not present

## 2018-01-12 DIAGNOSIS — N2581 Secondary hyperparathyroidism of renal origin: Secondary | ICD-10-CM | POA: Diagnosis not present

## 2018-01-12 DIAGNOSIS — D509 Iron deficiency anemia, unspecified: Secondary | ICD-10-CM | POA: Diagnosis not present

## 2018-01-12 DIAGNOSIS — D631 Anemia in chronic kidney disease: Secondary | ICD-10-CM | POA: Diagnosis not present

## 2018-01-12 DIAGNOSIS — Z992 Dependence on renal dialysis: Secondary | ICD-10-CM | POA: Diagnosis not present

## 2018-01-15 DIAGNOSIS — D631 Anemia in chronic kidney disease: Secondary | ICD-10-CM | POA: Diagnosis not present

## 2018-01-15 DIAGNOSIS — D509 Iron deficiency anemia, unspecified: Secondary | ICD-10-CM | POA: Diagnosis not present

## 2018-01-15 DIAGNOSIS — N186 End stage renal disease: Secondary | ICD-10-CM | POA: Diagnosis not present

## 2018-01-15 DIAGNOSIS — Z992 Dependence on renal dialysis: Secondary | ICD-10-CM | POA: Diagnosis not present

## 2018-01-15 DIAGNOSIS — N2581 Secondary hyperparathyroidism of renal origin: Secondary | ICD-10-CM | POA: Diagnosis not present

## 2018-01-17 DIAGNOSIS — N2581 Secondary hyperparathyroidism of renal origin: Secondary | ICD-10-CM | POA: Diagnosis not present

## 2018-01-17 DIAGNOSIS — D631 Anemia in chronic kidney disease: Secondary | ICD-10-CM | POA: Diagnosis not present

## 2018-01-17 DIAGNOSIS — N186 End stage renal disease: Secondary | ICD-10-CM | POA: Diagnosis not present

## 2018-01-17 DIAGNOSIS — D509 Iron deficiency anemia, unspecified: Secondary | ICD-10-CM | POA: Diagnosis not present

## 2018-01-17 DIAGNOSIS — Z992 Dependence on renal dialysis: Secondary | ICD-10-CM | POA: Diagnosis not present

## 2018-01-19 DIAGNOSIS — D509 Iron deficiency anemia, unspecified: Secondary | ICD-10-CM | POA: Diagnosis not present

## 2018-01-19 DIAGNOSIS — N2581 Secondary hyperparathyroidism of renal origin: Secondary | ICD-10-CM | POA: Diagnosis not present

## 2018-01-19 DIAGNOSIS — Z992 Dependence on renal dialysis: Secondary | ICD-10-CM | POA: Diagnosis not present

## 2018-01-19 DIAGNOSIS — D631 Anemia in chronic kidney disease: Secondary | ICD-10-CM | POA: Diagnosis not present

## 2018-01-19 DIAGNOSIS — N186 End stage renal disease: Secondary | ICD-10-CM | POA: Diagnosis not present

## 2018-01-22 DIAGNOSIS — Z992 Dependence on renal dialysis: Secondary | ICD-10-CM | POA: Diagnosis not present

## 2018-01-22 DIAGNOSIS — D509 Iron deficiency anemia, unspecified: Secondary | ICD-10-CM | POA: Diagnosis not present

## 2018-01-22 DIAGNOSIS — N2581 Secondary hyperparathyroidism of renal origin: Secondary | ICD-10-CM | POA: Diagnosis not present

## 2018-01-22 DIAGNOSIS — D631 Anemia in chronic kidney disease: Secondary | ICD-10-CM | POA: Diagnosis not present

## 2018-01-22 DIAGNOSIS — N186 End stage renal disease: Secondary | ICD-10-CM | POA: Diagnosis not present

## 2018-01-24 ENCOUNTER — Ambulatory Visit: Payer: Medicare Other | Admitting: Pediatrics

## 2018-01-24 DIAGNOSIS — D631 Anemia in chronic kidney disease: Secondary | ICD-10-CM | POA: Diagnosis not present

## 2018-01-24 DIAGNOSIS — N186 End stage renal disease: Secondary | ICD-10-CM | POA: Diagnosis not present

## 2018-01-24 DIAGNOSIS — N2581 Secondary hyperparathyroidism of renal origin: Secondary | ICD-10-CM | POA: Diagnosis not present

## 2018-01-24 DIAGNOSIS — D509 Iron deficiency anemia, unspecified: Secondary | ICD-10-CM | POA: Diagnosis not present

## 2018-01-24 DIAGNOSIS — Z992 Dependence on renal dialysis: Secondary | ICD-10-CM | POA: Diagnosis not present

## 2018-01-25 ENCOUNTER — Ambulatory Visit: Payer: Medicare Other | Admitting: Pediatrics

## 2018-01-26 DIAGNOSIS — Z992 Dependence on renal dialysis: Secondary | ICD-10-CM | POA: Diagnosis not present

## 2018-01-26 DIAGNOSIS — N2581 Secondary hyperparathyroidism of renal origin: Secondary | ICD-10-CM | POA: Diagnosis not present

## 2018-01-26 DIAGNOSIS — D631 Anemia in chronic kidney disease: Secondary | ICD-10-CM | POA: Diagnosis not present

## 2018-01-26 DIAGNOSIS — N186 End stage renal disease: Secondary | ICD-10-CM | POA: Diagnosis not present

## 2018-01-26 DIAGNOSIS — D509 Iron deficiency anemia, unspecified: Secondary | ICD-10-CM | POA: Diagnosis not present

## 2018-01-28 ENCOUNTER — Ambulatory Visit (INDEPENDENT_AMBULATORY_CARE_PROVIDER_SITE_OTHER): Payer: Medicare Other | Admitting: Pediatrics

## 2018-01-28 ENCOUNTER — Encounter: Payer: Self-pay | Admitting: Pediatrics

## 2018-01-28 VITALS — BP 136/71 | HR 60 | Temp 97.3°F | Resp 18 | Ht 63.74 in | Wt 182.6 lb

## 2018-01-28 DIAGNOSIS — M546 Pain in thoracic spine: Secondary | ICD-10-CM | POA: Diagnosis not present

## 2018-01-28 DIAGNOSIS — G8929 Other chronic pain: Secondary | ICD-10-CM

## 2018-01-28 DIAGNOSIS — E1121 Type 2 diabetes mellitus with diabetic nephropathy: Secondary | ICD-10-CM | POA: Diagnosis not present

## 2018-01-28 DIAGNOSIS — J440 Chronic obstructive pulmonary disease with acute lower respiratory infection: Secondary | ICD-10-CM | POA: Diagnosis not present

## 2018-01-28 DIAGNOSIS — I10 Essential (primary) hypertension: Secondary | ICD-10-CM | POA: Diagnosis not present

## 2018-01-28 DIAGNOSIS — F419 Anxiety disorder, unspecified: Secondary | ICD-10-CM | POA: Diagnosis not present

## 2018-01-28 LAB — BAYER DCA HB A1C WAIVED: HB A1C (BAYER DCA - WAIVED): 7 % — ABNORMAL HIGH (ref <7.0)

## 2018-01-28 MED ORDER — CYCLOBENZAPRINE HCL 5 MG PO TABS: 5.0000 mg | ORAL_TABLET | Freq: Three times a day (TID) | ORAL | 0 refills | Status: DC | PRN

## 2018-01-28 MED ORDER — ATENOLOL 50 MG PO TABS: ORAL_TABLET | ORAL | 3 refills | Status: DC

## 2018-01-28 MED ORDER — LORAZEPAM 0.5 MG PO TABS: 0.5000 mg | ORAL_TABLET | Freq: Three times a day (TID) | ORAL | 1 refills | Status: DC | PRN

## 2018-01-28 MED ORDER — ALBUTEROL SULFATE HFA 108 (90 BASE) MCG/ACT IN AERS: 2.0000 | INHALATION_SPRAY | Freq: Four times a day (QID) | RESPIRATORY_TRACT | 2 refills | Status: DC | PRN

## 2018-01-28 NOTE — Progress Notes (Signed)
Subjective:   Patient ID: [name], female    DOB: 26-Mar-1951, 67 y.o.   MRN: [id number] CC: Follow-up (3 month) mulitple med problems HPI: [name] is a 67 y.o. female presenting for Follow-up (3 month)  Cough for the past week. Sometimes coughs up yellow mucus. No fevers. Follows with Dr Luan Pulling for COPD in [place]. Tried cough syrup. Has had some coughing fits during dialysis. Normal appetite. No fevers. No SOB or wheezing. Has symbicort at home, not using regularly. Has used albuterol a few times with some improvement, needs refills.  Neck felt better when on prednisone, still tight now, can't turn around to look over L shoulder as well as before but does think is better.   Anxiety: doing ok. Taking lorazepam as needed.   DM2: BGLs up to 300s-400s with prednisone for a few days. Past week AM BGLs 110-143  Relevant past medical, surgical, family and social history reviewed. Allergies and medications reviewed and updated. Social History   Tobacco Use  Smoking Status Former Smoker  . Packs/day: 1.00  . Years: 30.00  . Pack years: 30.00  . Last attempt to quit: 12/04/2001  . Years since quitting: 16.1  Smokeless Tobacco Never Used   ROS: Per HPI   Objective:    BP 136/71   Pulse 60   Temp (!) 97.3 F (36.3 C) (Oral)   Ht 5' 3.74" (1.619 m)   Wt 182 lb 9.6 oz (82.8 kg)   BMI 31.60 kg/m   O2 sat 99% Wt Readings from Last 3 Encounters:  01/28/18 182 lb 9.6 oz (82.8 kg)  01/09/18 179 lb 6.4 oz (81.4 kg)  10/24/17 173 lb 3.2 oz (78.6 kg)    Gen: NAD, alert, cooperative with exam, NCAT EYES: EOMI, no conjunctival injection, or no icterus ENT:  TMs dull gray b/l, OP without erythema LYMPH: no cervical LAD CV: NRRR, normal S1/S2, no murmur, distal pulses 2+ b/l Resp: CTABL, no wheezes or crackles, normal WOB Ext: No edema, warm Neuro: Alert and oriented, strength equal b/l UE and LE, coordination grossly normal MSK: improved ROM neck  Assessment & Plan:    [name] was seen today for follow-up.  Diagnoses and all orders for this visit:  Type II diabetes mellitus with nephropathy (HCC) A1c 7, AM fasting BGl improved. Cont to check in AM and before dinner some nights. Let me know if >250 or < 100. SSI given for humalog. Cont glargine -     Bayer DCA Hb A1c Waived  Chronic left-sided thoracic back pain Stable, cont below -     cyclobenzaprine (FLEXERIL) 5 MG tablet; Take 1 tablet (5 mg total) by mouth 3 (three) times daily as needed for muscle spasms.  Anxiety Not taking every day. Discussed minimizing use. No more than 2-3 times in a week. Also on lexapro. -     LORazepam (ATIVAN) 0.5 MG tablet; Take 1 tablet (0.5 mg total) by mouth every 8 (eight) hours as needed for anxiety.  Bronchitis, chronic obstructive w acute bronchitis (HCC) Restart symbicort. Use albuteorl q6h next few days. Return precautions discussed. -     albuterol (PROVENTIL HFA;VENTOLIN HFA) 108 (90 Base) MCG/ACT inhaler; Inhale 2 puffs into the lungs every 6 (six) hours as needed for wheezing or shortness of breath.  Essential hypertension Stable, cont current meds -     atenolol (TENORMIN) 50 MG tablet; TAKE (1) TABLET TWICE DAILY.   Follow up plan: Return in about 3 months (around 04/27/2018). [name],  MD [place]

## 2018-01-28 NOTE — Patient Instructions (Signed)
Inject 0-15 Units into the skin 3 (three) times daily with meals. Sliding scaleCBG  <150: 0 units; CBG 151 - 200: 3 units; CBG 201 - 250: 5 units; CBG 251 - 300: 7 units; CBG 301 - 350: 9 units; CBG 351 - 400: 11 units; CBG > 400 : 13 units and notify your doctor

## 2018-01-29 DIAGNOSIS — N2581 Secondary hyperparathyroidism of renal origin: Secondary | ICD-10-CM | POA: Diagnosis not present

## 2018-01-29 DIAGNOSIS — D509 Iron deficiency anemia, unspecified: Secondary | ICD-10-CM | POA: Diagnosis not present

## 2018-01-29 DIAGNOSIS — D631 Anemia in chronic kidney disease: Secondary | ICD-10-CM | POA: Diagnosis not present

## 2018-01-29 DIAGNOSIS — N186 End stage renal disease: Secondary | ICD-10-CM | POA: Diagnosis not present

## 2018-01-29 DIAGNOSIS — Z992 Dependence on renal dialysis: Secondary | ICD-10-CM | POA: Diagnosis not present

## 2018-01-31 DIAGNOSIS — N2581 Secondary hyperparathyroidism of renal origin: Secondary | ICD-10-CM | POA: Diagnosis not present

## 2018-01-31 DIAGNOSIS — D509 Iron deficiency anemia, unspecified: Secondary | ICD-10-CM | POA: Diagnosis not present

## 2018-01-31 DIAGNOSIS — D631 Anemia in chronic kidney disease: Secondary | ICD-10-CM | POA: Diagnosis not present

## 2018-01-31 DIAGNOSIS — N186 End stage renal disease: Secondary | ICD-10-CM | POA: Diagnosis not present

## 2018-01-31 DIAGNOSIS — Z992 Dependence on renal dialysis: Secondary | ICD-10-CM | POA: Diagnosis not present

## 2018-02-02 DIAGNOSIS — D509 Iron deficiency anemia, unspecified: Secondary | ICD-10-CM | POA: Diagnosis not present

## 2018-02-02 DIAGNOSIS — N186 End stage renal disease: Secondary | ICD-10-CM | POA: Diagnosis not present

## 2018-02-02 DIAGNOSIS — N2581 Secondary hyperparathyroidism of renal origin: Secondary | ICD-10-CM | POA: Diagnosis not present

## 2018-02-02 DIAGNOSIS — Z992 Dependence on renal dialysis: Secondary | ICD-10-CM | POA: Diagnosis not present

## 2018-02-02 DIAGNOSIS — D631 Anemia in chronic kidney disease: Secondary | ICD-10-CM | POA: Diagnosis not present

## 2018-02-05 DIAGNOSIS — N186 End stage renal disease: Secondary | ICD-10-CM | POA: Diagnosis not present

## 2018-02-05 DIAGNOSIS — D509 Iron deficiency anemia, unspecified: Secondary | ICD-10-CM | POA: Diagnosis not present

## 2018-02-05 DIAGNOSIS — Z992 Dependence on renal dialysis: Secondary | ICD-10-CM | POA: Diagnosis not present

## 2018-02-05 DIAGNOSIS — N2581 Secondary hyperparathyroidism of renal origin: Secondary | ICD-10-CM | POA: Diagnosis not present

## 2018-02-05 DIAGNOSIS — D631 Anemia in chronic kidney disease: Secondary | ICD-10-CM | POA: Diagnosis not present

## 2018-02-07 DIAGNOSIS — N2581 Secondary hyperparathyroidism of renal origin: Secondary | ICD-10-CM | POA: Diagnosis not present

## 2018-02-07 DIAGNOSIS — D631 Anemia in chronic kidney disease: Secondary | ICD-10-CM | POA: Diagnosis not present

## 2018-02-07 DIAGNOSIS — Z992 Dependence on renal dialysis: Secondary | ICD-10-CM | POA: Diagnosis not present

## 2018-02-07 DIAGNOSIS — N186 End stage renal disease: Secondary | ICD-10-CM | POA: Diagnosis not present

## 2018-02-07 DIAGNOSIS — D509 Iron deficiency anemia, unspecified: Secondary | ICD-10-CM | POA: Diagnosis not present

## 2018-02-09 DIAGNOSIS — Z992 Dependence on renal dialysis: Secondary | ICD-10-CM | POA: Diagnosis not present

## 2018-02-09 DIAGNOSIS — D631 Anemia in chronic kidney disease: Secondary | ICD-10-CM | POA: Diagnosis not present

## 2018-02-09 DIAGNOSIS — N186 End stage renal disease: Secondary | ICD-10-CM | POA: Diagnosis not present

## 2018-02-09 DIAGNOSIS — D509 Iron deficiency anemia, unspecified: Secondary | ICD-10-CM | POA: Diagnosis not present

## 2018-02-09 DIAGNOSIS — N2581 Secondary hyperparathyroidism of renal origin: Secondary | ICD-10-CM | POA: Diagnosis not present

## 2018-02-12 DIAGNOSIS — N186 End stage renal disease: Secondary | ICD-10-CM | POA: Diagnosis not present

## 2018-02-12 DIAGNOSIS — N2581 Secondary hyperparathyroidism of renal origin: Secondary | ICD-10-CM | POA: Diagnosis not present

## 2018-02-12 DIAGNOSIS — D631 Anemia in chronic kidney disease: Secondary | ICD-10-CM | POA: Diagnosis not present

## 2018-02-12 DIAGNOSIS — D509 Iron deficiency anemia, unspecified: Secondary | ICD-10-CM | POA: Diagnosis not present

## 2018-02-12 DIAGNOSIS — Z992 Dependence on renal dialysis: Secondary | ICD-10-CM | POA: Diagnosis not present

## 2018-02-14 ENCOUNTER — Telehealth: Payer: Self-pay | Admitting: Pediatrics

## 2018-02-14 DIAGNOSIS — D631 Anemia in chronic kidney disease: Secondary | ICD-10-CM | POA: Diagnosis not present

## 2018-02-14 DIAGNOSIS — Z992 Dependence on renal dialysis: Secondary | ICD-10-CM | POA: Diagnosis not present

## 2018-02-14 DIAGNOSIS — N2581 Secondary hyperparathyroidism of renal origin: Secondary | ICD-10-CM | POA: Diagnosis not present

## 2018-02-14 DIAGNOSIS — N186 End stage renal disease: Secondary | ICD-10-CM | POA: Diagnosis not present

## 2018-02-14 DIAGNOSIS — D509 Iron deficiency anemia, unspecified: Secondary | ICD-10-CM | POA: Diagnosis not present

## 2018-02-16 DIAGNOSIS — Z992 Dependence on renal dialysis: Secondary | ICD-10-CM | POA: Diagnosis not present

## 2018-02-16 DIAGNOSIS — D509 Iron deficiency anemia, unspecified: Secondary | ICD-10-CM | POA: Diagnosis not present

## 2018-02-16 DIAGNOSIS — D631 Anemia in chronic kidney disease: Secondary | ICD-10-CM | POA: Diagnosis not present

## 2018-02-16 DIAGNOSIS — N2581 Secondary hyperparathyroidism of renal origin: Secondary | ICD-10-CM | POA: Diagnosis not present

## 2018-02-16 DIAGNOSIS — N186 End stage renal disease: Secondary | ICD-10-CM | POA: Diagnosis not present

## 2018-02-19 DIAGNOSIS — D631 Anemia in chronic kidney disease: Secondary | ICD-10-CM | POA: Diagnosis not present

## 2018-02-19 DIAGNOSIS — N186 End stage renal disease: Secondary | ICD-10-CM | POA: Diagnosis not present

## 2018-02-19 DIAGNOSIS — D509 Iron deficiency anemia, unspecified: Secondary | ICD-10-CM | POA: Diagnosis not present

## 2018-02-19 DIAGNOSIS — N2581 Secondary hyperparathyroidism of renal origin: Secondary | ICD-10-CM | POA: Diagnosis not present

## 2018-02-19 DIAGNOSIS — Z992 Dependence on renal dialysis: Secondary | ICD-10-CM | POA: Diagnosis not present

## 2018-02-21 DIAGNOSIS — D509 Iron deficiency anemia, unspecified: Secondary | ICD-10-CM | POA: Diagnosis not present

## 2018-02-21 DIAGNOSIS — N186 End stage renal disease: Secondary | ICD-10-CM | POA: Diagnosis not present

## 2018-02-21 DIAGNOSIS — N2581 Secondary hyperparathyroidism of renal origin: Secondary | ICD-10-CM | POA: Diagnosis not present

## 2018-02-21 DIAGNOSIS — Z992 Dependence on renal dialysis: Secondary | ICD-10-CM | POA: Diagnosis not present

## 2018-02-21 DIAGNOSIS — D631 Anemia in chronic kidney disease: Secondary | ICD-10-CM | POA: Diagnosis not present

## 2018-02-23 DIAGNOSIS — N186 End stage renal disease: Secondary | ICD-10-CM | POA: Diagnosis not present

## 2018-02-23 DIAGNOSIS — Z992 Dependence on renal dialysis: Secondary | ICD-10-CM | POA: Diagnosis not present

## 2018-02-23 DIAGNOSIS — D509 Iron deficiency anemia, unspecified: Secondary | ICD-10-CM | POA: Diagnosis not present

## 2018-02-23 DIAGNOSIS — N2581 Secondary hyperparathyroidism of renal origin: Secondary | ICD-10-CM | POA: Diagnosis not present

## 2018-02-23 DIAGNOSIS — D631 Anemia in chronic kidney disease: Secondary | ICD-10-CM | POA: Diagnosis not present

## 2018-02-26 DIAGNOSIS — D509 Iron deficiency anemia, unspecified: Secondary | ICD-10-CM | POA: Diagnosis not present

## 2018-02-26 DIAGNOSIS — N2581 Secondary hyperparathyroidism of renal origin: Secondary | ICD-10-CM | POA: Diagnosis not present

## 2018-02-26 DIAGNOSIS — D631 Anemia in chronic kidney disease: Secondary | ICD-10-CM | POA: Diagnosis not present

## 2018-02-26 DIAGNOSIS — E119 Type 2 diabetes mellitus without complications: Secondary | ICD-10-CM | POA: Diagnosis not present

## 2018-02-26 DIAGNOSIS — Z992 Dependence on renal dialysis: Secondary | ICD-10-CM | POA: Diagnosis not present

## 2018-02-26 DIAGNOSIS — N186 End stage renal disease: Secondary | ICD-10-CM | POA: Diagnosis not present

## 2018-02-26 DIAGNOSIS — Z794 Long term (current) use of insulin: Secondary | ICD-10-CM | POA: Diagnosis not present

## 2018-02-28 DIAGNOSIS — N186 End stage renal disease: Secondary | ICD-10-CM | POA: Diagnosis not present

## 2018-02-28 DIAGNOSIS — D631 Anemia in chronic kidney disease: Secondary | ICD-10-CM | POA: Diagnosis not present

## 2018-02-28 DIAGNOSIS — N2581 Secondary hyperparathyroidism of renal origin: Secondary | ICD-10-CM | POA: Diagnosis not present

## 2018-02-28 DIAGNOSIS — D509 Iron deficiency anemia, unspecified: Secondary | ICD-10-CM | POA: Diagnosis not present

## 2018-02-28 DIAGNOSIS — Z992 Dependence on renal dialysis: Secondary | ICD-10-CM | POA: Diagnosis not present

## 2018-03-02 DIAGNOSIS — D631 Anemia in chronic kidney disease: Secondary | ICD-10-CM | POA: Diagnosis not present

## 2018-03-02 DIAGNOSIS — N2581 Secondary hyperparathyroidism of renal origin: Secondary | ICD-10-CM | POA: Diagnosis not present

## 2018-03-02 DIAGNOSIS — N186 End stage renal disease: Secondary | ICD-10-CM | POA: Diagnosis not present

## 2018-03-02 DIAGNOSIS — Z992 Dependence on renal dialysis: Secondary | ICD-10-CM | POA: Diagnosis not present

## 2018-03-02 DIAGNOSIS — D509 Iron deficiency anemia, unspecified: Secondary | ICD-10-CM | POA: Diagnosis not present

## 2018-03-03 DIAGNOSIS — N186 End stage renal disease: Secondary | ICD-10-CM | POA: Diagnosis not present

## 2018-03-03 DIAGNOSIS — Z992 Dependence on renal dialysis: Secondary | ICD-10-CM | POA: Diagnosis not present

## 2018-03-05 DIAGNOSIS — Z992 Dependence on renal dialysis: Secondary | ICD-10-CM | POA: Diagnosis not present

## 2018-03-05 DIAGNOSIS — N186 End stage renal disease: Secondary | ICD-10-CM | POA: Diagnosis not present

## 2018-03-05 DIAGNOSIS — D509 Iron deficiency anemia, unspecified: Secondary | ICD-10-CM | POA: Diagnosis not present

## 2018-03-05 DIAGNOSIS — D631 Anemia in chronic kidney disease: Secondary | ICD-10-CM | POA: Diagnosis not present

## 2018-03-05 DIAGNOSIS — N2581 Secondary hyperparathyroidism of renal origin: Secondary | ICD-10-CM | POA: Diagnosis not present

## 2018-03-07 ENCOUNTER — Other Ambulatory Visit: Payer: Self-pay | Admitting: Pediatrics

## 2018-03-07 DIAGNOSIS — N186 End stage renal disease: Secondary | ICD-10-CM | POA: Diagnosis not present

## 2018-03-07 DIAGNOSIS — N2581 Secondary hyperparathyroidism of renal origin: Secondary | ICD-10-CM | POA: Diagnosis not present

## 2018-03-07 DIAGNOSIS — D631 Anemia in chronic kidney disease: Secondary | ICD-10-CM | POA: Diagnosis not present

## 2018-03-07 DIAGNOSIS — Z992 Dependence on renal dialysis: Secondary | ICD-10-CM | POA: Diagnosis not present

## 2018-03-07 DIAGNOSIS — D509 Iron deficiency anemia, unspecified: Secondary | ICD-10-CM | POA: Diagnosis not present

## 2018-03-09 DIAGNOSIS — D509 Iron deficiency anemia, unspecified: Secondary | ICD-10-CM | POA: Diagnosis not present

## 2018-03-09 DIAGNOSIS — N186 End stage renal disease: Secondary | ICD-10-CM | POA: Diagnosis not present

## 2018-03-09 DIAGNOSIS — N2581 Secondary hyperparathyroidism of renal origin: Secondary | ICD-10-CM | POA: Diagnosis not present

## 2018-03-09 DIAGNOSIS — Z992 Dependence on renal dialysis: Secondary | ICD-10-CM | POA: Diagnosis not present

## 2018-03-09 DIAGNOSIS — D631 Anemia in chronic kidney disease: Secondary | ICD-10-CM | POA: Diagnosis not present

## 2018-03-11 ENCOUNTER — Other Ambulatory Visit: Payer: Self-pay | Admitting: Pediatrics

## 2018-03-11 DIAGNOSIS — J302 Other seasonal allergic rhinitis: Secondary | ICD-10-CM

## 2018-03-11 DIAGNOSIS — F419 Anxiety disorder, unspecified: Secondary | ICD-10-CM

## 2018-03-12 DIAGNOSIS — D509 Iron deficiency anemia, unspecified: Secondary | ICD-10-CM | POA: Diagnosis not present

## 2018-03-12 DIAGNOSIS — N186 End stage renal disease: Secondary | ICD-10-CM | POA: Diagnosis not present

## 2018-03-12 DIAGNOSIS — D631 Anemia in chronic kidney disease: Secondary | ICD-10-CM | POA: Diagnosis not present

## 2018-03-12 DIAGNOSIS — Z992 Dependence on renal dialysis: Secondary | ICD-10-CM | POA: Diagnosis not present

## 2018-03-12 DIAGNOSIS — N2581 Secondary hyperparathyroidism of renal origin: Secondary | ICD-10-CM | POA: Diagnosis not present

## 2018-03-14 DIAGNOSIS — D631 Anemia in chronic kidney disease: Secondary | ICD-10-CM | POA: Diagnosis not present

## 2018-03-14 DIAGNOSIS — D509 Iron deficiency anemia, unspecified: Secondary | ICD-10-CM | POA: Diagnosis not present

## 2018-03-14 DIAGNOSIS — N186 End stage renal disease: Secondary | ICD-10-CM | POA: Diagnosis not present

## 2018-03-14 DIAGNOSIS — N2581 Secondary hyperparathyroidism of renal origin: Secondary | ICD-10-CM | POA: Diagnosis not present

## 2018-03-14 DIAGNOSIS — Z992 Dependence on renal dialysis: Secondary | ICD-10-CM | POA: Diagnosis not present

## 2018-03-16 DIAGNOSIS — D509 Iron deficiency anemia, unspecified: Secondary | ICD-10-CM | POA: Diagnosis not present

## 2018-03-16 DIAGNOSIS — Z992 Dependence on renal dialysis: Secondary | ICD-10-CM | POA: Diagnosis not present

## 2018-03-16 DIAGNOSIS — N186 End stage renal disease: Secondary | ICD-10-CM | POA: Diagnosis not present

## 2018-03-16 DIAGNOSIS — N2581 Secondary hyperparathyroidism of renal origin: Secondary | ICD-10-CM | POA: Diagnosis not present

## 2018-03-16 DIAGNOSIS — D631 Anemia in chronic kidney disease: Secondary | ICD-10-CM | POA: Diagnosis not present

## 2018-03-19 DIAGNOSIS — Z992 Dependence on renal dialysis: Secondary | ICD-10-CM | POA: Diagnosis not present

## 2018-03-19 DIAGNOSIS — N186 End stage renal disease: Secondary | ICD-10-CM | POA: Diagnosis not present

## 2018-03-19 DIAGNOSIS — N2581 Secondary hyperparathyroidism of renal origin: Secondary | ICD-10-CM | POA: Diagnosis not present

## 2018-03-19 DIAGNOSIS — D509 Iron deficiency anemia, unspecified: Secondary | ICD-10-CM | POA: Diagnosis not present

## 2018-03-19 DIAGNOSIS — D631 Anemia in chronic kidney disease: Secondary | ICD-10-CM | POA: Diagnosis not present

## 2018-03-21 DIAGNOSIS — N186 End stage renal disease: Secondary | ICD-10-CM | POA: Diagnosis not present

## 2018-03-21 DIAGNOSIS — D509 Iron deficiency anemia, unspecified: Secondary | ICD-10-CM | POA: Diagnosis not present

## 2018-03-21 DIAGNOSIS — N2581 Secondary hyperparathyroidism of renal origin: Secondary | ICD-10-CM | POA: Diagnosis not present

## 2018-03-21 DIAGNOSIS — D631 Anemia in chronic kidney disease: Secondary | ICD-10-CM | POA: Diagnosis not present

## 2018-03-21 DIAGNOSIS — Z992 Dependence on renal dialysis: Secondary | ICD-10-CM | POA: Diagnosis not present

## 2018-03-23 DIAGNOSIS — N2581 Secondary hyperparathyroidism of renal origin: Secondary | ICD-10-CM | POA: Diagnosis not present

## 2018-03-23 DIAGNOSIS — N186 End stage renal disease: Secondary | ICD-10-CM | POA: Diagnosis not present

## 2018-03-23 DIAGNOSIS — Z992 Dependence on renal dialysis: Secondary | ICD-10-CM | POA: Diagnosis not present

## 2018-03-23 DIAGNOSIS — D509 Iron deficiency anemia, unspecified: Secondary | ICD-10-CM | POA: Diagnosis not present

## 2018-03-23 DIAGNOSIS — D631 Anemia in chronic kidney disease: Secondary | ICD-10-CM | POA: Diagnosis not present

## 2018-03-25 DIAGNOSIS — E119 Type 2 diabetes mellitus without complications: Secondary | ICD-10-CM | POA: Diagnosis not present

## 2018-03-25 DIAGNOSIS — H40013 Open angle with borderline findings, low risk, bilateral: Secondary | ICD-10-CM | POA: Diagnosis not present

## 2018-03-25 DIAGNOSIS — H26492 Other secondary cataract, left eye: Secondary | ICD-10-CM | POA: Diagnosis not present

## 2018-03-25 DIAGNOSIS — Z961 Presence of intraocular lens: Secondary | ICD-10-CM | POA: Diagnosis not present

## 2018-03-25 LAB — HM DIABETES EYE EXAM

## 2018-03-26 DIAGNOSIS — N186 End stage renal disease: Secondary | ICD-10-CM | POA: Diagnosis not present

## 2018-03-26 DIAGNOSIS — Z992 Dependence on renal dialysis: Secondary | ICD-10-CM | POA: Diagnosis not present

## 2018-03-26 DIAGNOSIS — D509 Iron deficiency anemia, unspecified: Secondary | ICD-10-CM | POA: Diagnosis not present

## 2018-03-26 DIAGNOSIS — D631 Anemia in chronic kidney disease: Secondary | ICD-10-CM | POA: Diagnosis not present

## 2018-03-26 DIAGNOSIS — N2581 Secondary hyperparathyroidism of renal origin: Secondary | ICD-10-CM | POA: Diagnosis not present

## 2018-03-28 DIAGNOSIS — D509 Iron deficiency anemia, unspecified: Secondary | ICD-10-CM | POA: Diagnosis not present

## 2018-03-28 DIAGNOSIS — Z992 Dependence on renal dialysis: Secondary | ICD-10-CM | POA: Diagnosis not present

## 2018-03-28 DIAGNOSIS — N186 End stage renal disease: Secondary | ICD-10-CM | POA: Diagnosis not present

## 2018-03-28 DIAGNOSIS — N2581 Secondary hyperparathyroidism of renal origin: Secondary | ICD-10-CM | POA: Diagnosis not present

## 2018-03-28 DIAGNOSIS — D631 Anemia in chronic kidney disease: Secondary | ICD-10-CM | POA: Diagnosis not present

## 2018-03-30 DIAGNOSIS — N186 End stage renal disease: Secondary | ICD-10-CM | POA: Diagnosis not present

## 2018-03-30 DIAGNOSIS — N2581 Secondary hyperparathyroidism of renal origin: Secondary | ICD-10-CM | POA: Diagnosis not present

## 2018-03-30 DIAGNOSIS — Z992 Dependence on renal dialysis: Secondary | ICD-10-CM | POA: Diagnosis not present

## 2018-03-30 DIAGNOSIS — D631 Anemia in chronic kidney disease: Secondary | ICD-10-CM | POA: Diagnosis not present

## 2018-03-30 DIAGNOSIS — D509 Iron deficiency anemia, unspecified: Secondary | ICD-10-CM | POA: Diagnosis not present

## 2018-04-02 DIAGNOSIS — D509 Iron deficiency anemia, unspecified: Secondary | ICD-10-CM | POA: Diagnosis not present

## 2018-04-02 DIAGNOSIS — Z992 Dependence on renal dialysis: Secondary | ICD-10-CM | POA: Diagnosis not present

## 2018-04-02 DIAGNOSIS — N186 End stage renal disease: Secondary | ICD-10-CM | POA: Diagnosis not present

## 2018-04-02 DIAGNOSIS — D631 Anemia in chronic kidney disease: Secondary | ICD-10-CM | POA: Diagnosis not present

## 2018-04-02 DIAGNOSIS — N2581 Secondary hyperparathyroidism of renal origin: Secondary | ICD-10-CM | POA: Diagnosis not present

## 2018-04-04 DIAGNOSIS — Z992 Dependence on renal dialysis: Secondary | ICD-10-CM | POA: Diagnosis not present

## 2018-04-04 DIAGNOSIS — D509 Iron deficiency anemia, unspecified: Secondary | ICD-10-CM | POA: Diagnosis not present

## 2018-04-04 DIAGNOSIS — D631 Anemia in chronic kidney disease: Secondary | ICD-10-CM | POA: Diagnosis not present

## 2018-04-04 DIAGNOSIS — N2581 Secondary hyperparathyroidism of renal origin: Secondary | ICD-10-CM | POA: Diagnosis not present

## 2018-04-04 DIAGNOSIS — N186 End stage renal disease: Secondary | ICD-10-CM | POA: Diagnosis not present

## 2018-04-06 DIAGNOSIS — D509 Iron deficiency anemia, unspecified: Secondary | ICD-10-CM | POA: Diagnosis not present

## 2018-04-06 DIAGNOSIS — N186 End stage renal disease: Secondary | ICD-10-CM | POA: Diagnosis not present

## 2018-04-06 DIAGNOSIS — Z992 Dependence on renal dialysis: Secondary | ICD-10-CM | POA: Diagnosis not present

## 2018-04-06 DIAGNOSIS — D631 Anemia in chronic kidney disease: Secondary | ICD-10-CM | POA: Diagnosis not present

## 2018-04-06 DIAGNOSIS — N2581 Secondary hyperparathyroidism of renal origin: Secondary | ICD-10-CM | POA: Diagnosis not present

## 2018-04-09 DIAGNOSIS — D509 Iron deficiency anemia, unspecified: Secondary | ICD-10-CM | POA: Diagnosis not present

## 2018-04-09 DIAGNOSIS — N2581 Secondary hyperparathyroidism of renal origin: Secondary | ICD-10-CM | POA: Diagnosis not present

## 2018-04-09 DIAGNOSIS — N186 End stage renal disease: Secondary | ICD-10-CM | POA: Diagnosis not present

## 2018-04-09 DIAGNOSIS — Z992 Dependence on renal dialysis: Secondary | ICD-10-CM | POA: Diagnosis not present

## 2018-04-09 DIAGNOSIS — D631 Anemia in chronic kidney disease: Secondary | ICD-10-CM | POA: Diagnosis not present

## 2018-04-11 ENCOUNTER — Encounter: Payer: Self-pay | Admitting: Cardiology

## 2018-04-11 DIAGNOSIS — D509 Iron deficiency anemia, unspecified: Secondary | ICD-10-CM | POA: Diagnosis not present

## 2018-04-11 DIAGNOSIS — N186 End stage renal disease: Secondary | ICD-10-CM | POA: Diagnosis not present

## 2018-04-11 DIAGNOSIS — Z992 Dependence on renal dialysis: Secondary | ICD-10-CM | POA: Diagnosis not present

## 2018-04-11 DIAGNOSIS — D631 Anemia in chronic kidney disease: Secondary | ICD-10-CM | POA: Diagnosis not present

## 2018-04-11 DIAGNOSIS — N2581 Secondary hyperparathyroidism of renal origin: Secondary | ICD-10-CM | POA: Diagnosis not present

## 2018-04-13 DIAGNOSIS — Z992 Dependence on renal dialysis: Secondary | ICD-10-CM | POA: Diagnosis not present

## 2018-04-13 DIAGNOSIS — N2581 Secondary hyperparathyroidism of renal origin: Secondary | ICD-10-CM | POA: Diagnosis not present

## 2018-04-13 DIAGNOSIS — D631 Anemia in chronic kidney disease: Secondary | ICD-10-CM | POA: Diagnosis not present

## 2018-04-13 DIAGNOSIS — N186 End stage renal disease: Secondary | ICD-10-CM | POA: Diagnosis not present

## 2018-04-13 DIAGNOSIS — D509 Iron deficiency anemia, unspecified: Secondary | ICD-10-CM | POA: Diagnosis not present

## 2018-04-15 ENCOUNTER — Other Ambulatory Visit: Payer: Self-pay | Admitting: Pediatrics

## 2018-04-16 DIAGNOSIS — N186 End stage renal disease: Secondary | ICD-10-CM | POA: Diagnosis not present

## 2018-04-16 DIAGNOSIS — D631 Anemia in chronic kidney disease: Secondary | ICD-10-CM | POA: Diagnosis not present

## 2018-04-16 DIAGNOSIS — N2581 Secondary hyperparathyroidism of renal origin: Secondary | ICD-10-CM | POA: Diagnosis not present

## 2018-04-16 DIAGNOSIS — D509 Iron deficiency anemia, unspecified: Secondary | ICD-10-CM | POA: Diagnosis not present

## 2018-04-16 DIAGNOSIS — Z992 Dependence on renal dialysis: Secondary | ICD-10-CM | POA: Diagnosis not present

## 2018-04-18 DIAGNOSIS — Z992 Dependence on renal dialysis: Secondary | ICD-10-CM | POA: Diagnosis not present

## 2018-04-18 DIAGNOSIS — N186 End stage renal disease: Secondary | ICD-10-CM | POA: Diagnosis not present

## 2018-04-18 DIAGNOSIS — D509 Iron deficiency anemia, unspecified: Secondary | ICD-10-CM | POA: Diagnosis not present

## 2018-04-18 DIAGNOSIS — D631 Anemia in chronic kidney disease: Secondary | ICD-10-CM | POA: Diagnosis not present

## 2018-04-18 DIAGNOSIS — N2581 Secondary hyperparathyroidism of renal origin: Secondary | ICD-10-CM | POA: Diagnosis not present

## 2018-04-20 DIAGNOSIS — D509 Iron deficiency anemia, unspecified: Secondary | ICD-10-CM | POA: Diagnosis not present

## 2018-04-20 DIAGNOSIS — N2581 Secondary hyperparathyroidism of renal origin: Secondary | ICD-10-CM | POA: Diagnosis not present

## 2018-04-20 DIAGNOSIS — N186 End stage renal disease: Secondary | ICD-10-CM | POA: Diagnosis not present

## 2018-04-20 DIAGNOSIS — D631 Anemia in chronic kidney disease: Secondary | ICD-10-CM | POA: Diagnosis not present

## 2018-04-20 DIAGNOSIS — Z992 Dependence on renal dialysis: Secondary | ICD-10-CM | POA: Diagnosis not present

## 2018-04-22 DIAGNOSIS — H26492 Other secondary cataract, left eye: Secondary | ICD-10-CM | POA: Diagnosis not present

## 2018-04-23 DIAGNOSIS — Z992 Dependence on renal dialysis: Secondary | ICD-10-CM | POA: Diagnosis not present

## 2018-04-23 DIAGNOSIS — D631 Anemia in chronic kidney disease: Secondary | ICD-10-CM | POA: Diagnosis not present

## 2018-04-23 DIAGNOSIS — N186 End stage renal disease: Secondary | ICD-10-CM | POA: Diagnosis not present

## 2018-04-23 DIAGNOSIS — N2581 Secondary hyperparathyroidism of renal origin: Secondary | ICD-10-CM | POA: Diagnosis not present

## 2018-04-23 DIAGNOSIS — D509 Iron deficiency anemia, unspecified: Secondary | ICD-10-CM | POA: Diagnosis not present

## 2018-04-25 DIAGNOSIS — D509 Iron deficiency anemia, unspecified: Secondary | ICD-10-CM | POA: Diagnosis not present

## 2018-04-25 DIAGNOSIS — N2581 Secondary hyperparathyroidism of renal origin: Secondary | ICD-10-CM | POA: Diagnosis not present

## 2018-04-25 DIAGNOSIS — Z992 Dependence on renal dialysis: Secondary | ICD-10-CM | POA: Diagnosis not present

## 2018-04-25 DIAGNOSIS — D631 Anemia in chronic kidney disease: Secondary | ICD-10-CM | POA: Diagnosis not present

## 2018-04-25 DIAGNOSIS — N186 End stage renal disease: Secondary | ICD-10-CM | POA: Diagnosis not present

## 2018-04-27 DIAGNOSIS — Z992 Dependence on renal dialysis: Secondary | ICD-10-CM | POA: Diagnosis not present

## 2018-04-27 DIAGNOSIS — D509 Iron deficiency anemia, unspecified: Secondary | ICD-10-CM | POA: Diagnosis not present

## 2018-04-27 DIAGNOSIS — D631 Anemia in chronic kidney disease: Secondary | ICD-10-CM | POA: Diagnosis not present

## 2018-04-27 DIAGNOSIS — N2581 Secondary hyperparathyroidism of renal origin: Secondary | ICD-10-CM | POA: Diagnosis not present

## 2018-04-27 DIAGNOSIS — N186 End stage renal disease: Secondary | ICD-10-CM | POA: Diagnosis not present

## 2018-04-30 DIAGNOSIS — N2581 Secondary hyperparathyroidism of renal origin: Secondary | ICD-10-CM | POA: Diagnosis not present

## 2018-04-30 DIAGNOSIS — N186 End stage renal disease: Secondary | ICD-10-CM | POA: Diagnosis not present

## 2018-04-30 DIAGNOSIS — D509 Iron deficiency anemia, unspecified: Secondary | ICD-10-CM | POA: Diagnosis not present

## 2018-04-30 DIAGNOSIS — Z992 Dependence on renal dialysis: Secondary | ICD-10-CM | POA: Diagnosis not present

## 2018-04-30 DIAGNOSIS — D631 Anemia in chronic kidney disease: Secondary | ICD-10-CM | POA: Diagnosis not present

## 2018-04-30 NOTE — Progress Notes (Signed)
HPI The patient presents for follow up of CAD and diastolic heart failure with diastolic dysfunction.   Since I last saw her she is been doing okay.  The only time he would get chest discomfort is here today removed too much fluid with dialysis.  She is able to do activities around her house such as shopping and going to church and keeping things picked up without bringing on any symptoms.  He does dialysis 3 times per week.  He denies any new shortness of breath, PND or orthopnea.  He has had no new palpitations, presyncope or syncope.  Is had no weight gain or edema.   Allergies  Allergen Reactions  . Azithromycin     Prolonged QT  . Ace Inhibitors Cough  . Clopidogrel Bisulfate Other (See Comments)    REACTION: Sick, Headache, "Felt terrible"  . Codeine Other (See Comments)    REACTION: hallucinations  . Lisinopril Other (See Comments)    cough  . Penicillins Other (See Comments)    Has patient had a PCN reaction causing immediate rash, facial/tongue/throat swelling, SOB or lightheadedness with hypotension: unknown Has patient had a PCN reaction causing severe rash involving mucus membranes or skin necrosis: unknown Has patient had a PCN reaction that required hospitalization unknown Has patient had a PCN reaction occurring within the last 10 years:unknown If all of the above answers are "NO", then may proceed with Cephalosporin use.     Current Outpatient Medications  Medication Sig Dispense Refill  . albuterol (PROVENTIL HFA;VENTOLIN HFA) 108 (90 Base) MCG/ACT inhaler Inhale 2 puffs into the lungs every 6 (six) hours as needed for wheezing or shortness of breath. 1 Inhaler 2  . albuterol (PROVENTIL) (2.5 MG/3ML) 0.083% nebulizer solution Take 3 mLs (2.5 mg total) by nebulization every 6 (six) hours as needed for wheezing or shortness of breath. 75 mL 12  . aspirin 81 MG EC tablet Take 81 mg by mouth daily.      [name] atenolol (TENORMIN) 50 MG tablet TAKE (1) TABLET TWICE DAILY.  60 tablet 3  . budesonide-formoterol (SYMBICORT) 160-4.5 MCG/ACT inhaler Inhale 2 puffs into the lungs 2 (two) times daily as needed.    . calcium acetate (PHOSLO) 667 MG capsule Take 667-1,334 mg by mouth See admin instructions. Take 2 capsules with meals 3 times daily and 1 capsule with snacks    . Carboxymethylcellulose Sodium (THERATEARS OP) Place 2 drops into both eyes as needed (for dry eyes). Reported on 12/09/2015    . cyclobenzaprine (FLEXERIL) 5 MG tablet Take 1 tablet (5 mg total) by mouth 3 (three) times daily as needed for muscle spasms. 30 tablet 0  . docusate sodium (COLACE) 100 MG capsule Take 100 mg by mouth 2 (two) times daily as needed for mild constipation.    [name] escitalopram (LEXAPRO) 10 MG tablet Take 1 tablet (10 mg total) by mouth daily. 90 tablet 3  . hydrALAZINE (APRESOLINE) 50 MG tablet Take 1 tablet (50 mg total) by mouth 2 (two) times daily. 1 tablet 0  . Insulin Glargine (LANTUS SOLOSTAR) 100 UNIT/ML Solostar Pen Inject 15-25 Units into the skin daily at 10 pm. 15 mL 3  . LORazepam (ATIVAN) 0.5 MG tablet Take 1 tablet (0.5 mg total) by mouth every 8 (eight) hours as needed for anxiety. 20 tablet 1  . NIFEdipine (PROCARDIA XL/ADALAT-CC) 90 MG 24 hr tablet Take 90 mg by mouth every evening.     . nitroGLYCERIN (NITROSTAT) 0.4 MG SL tablet Place  1 tablet (0.4 mg total) under the tongue every 5 (five) minutes as needed for chest pain. 25 tablet 5  . pravastatin (PRAVACHOL) 20 MG tablet TAKE (1) TABLET BY MOUTH ONCE DAILY. 30 tablet 0  . torsemide (DEMADEX) 20 MG tablet 20 mg. Take two tablets by mouth once daily    . glucose blood test strip Test 4 times daily 120 each 5   No current facility-administered medications for this visit.     Past Medical History:  Diagnosis Date  . Acute on chronic diastolic CHF (congestive heart failure) ([place]) 03/05/2015   Grade 2. EF 60-65%  . Anemia    hx of  . Anxiety   . Arthritis   . CAD (coronary artery disease)    s/p Stenting in  2005;  [facility] 7/09: Vigorous LV function, 2-3+ MR, RI 40%, RI stent patent, proximal-mid RCA 40-50%;  [facility] 7/09:  EF 55-65%, trivial MR;  [facility] 11/18/12: EF 66%, normal LV wall motion, mild to moderate anterior ischemia - reviewed by [facility] and felt to rep breast atten (low risk);  [facility] 6/14: mild LVH, EF 55-65%, Gr 2 DD, PASP 44, trivial eff   . Chronic kidney disease    CREATININE IS UP--GOING TO KIDNEY MD   . Chronic neck pain   . COPD (chronic obstructive pulmonary disease) ([place])   . Degenerative joint disease    left shoulder  . Diabetic neuropathy ([place])   . Diverticulosis   . Esophageal stricture   . GERD (gastroesophageal reflux disease)   . Hyperlipidemia   . Hypertension   . IDDM (insulin dependent diabetes mellitus) ([place])   . Pericardial effusion 03/06/2015   Very small per echo ; pleural nodules seen on CT chest.  . Peripheral vascular disease ([place])   . Stroke [facility])    "mini- years ago"  . Vitamin D deficiency     Past Surgical History:  Procedure Laterality Date  . A/V FISTULAGRAM N/A 08/10/2017   Procedure: A/V Fistulagram - Right Arm;  Surgeon: Elam Dutch, MD;  Location: [facility];  Service: Cardiovascular;  Laterality: N/A;  . ABDOMINAL HYSTERECTOMY    . AV FISTULA PLACEMENT Right 07/20/2015   Procedure: RADIAL CEPHALIC ARTERIOVENOUS FISTULA CREATION RIGHT ARM;  Surgeon: Angelia Mould, MD;  Location: [facility];  Service: Vascular;  Laterality: Right;  . AV FISTULA PLACEMENT Right 11/26/2015   Procedure:  Creation of RIGHT BRACHIO-CEPHALIC arteriovenous fistula;  Surgeon: Angelia Mould, MD;  Location: [facility];  Service: Vascular;  Laterality: Right;  . BACK SURGERY     2007 & 2013  . BLADDER SURGERY     Tack  . CARDIAC CATHETERIZATION  2003, 2005, 2009   1 stent  . cardiac stents    . CATARACT EXTRACTION W/PHACO Right 05/04/2014   Procedure: CATARACT EXTRACTION PHACO AND INTRAOCULAR LENS PLACEMENT (IOC);  Surgeon: Williams Che, MD;  Location: [facility];  Service: Ophthalmology;  Laterality: Right;  CDE 1.47  . CATARACT EXTRACTION W/PHACO Left 07/20/2014   Procedure: CATARACT EXTRACTION WITH PHACO AND INTRAOCULAR LENS PLACEMENT LEFT EYE CDE=1.79;  Surgeon: Williams Che, MD;  Location: [facility];  Service: Ophthalmology;  Laterality: Left;  . CERVICAL SPINE SURGERY  2012   8 screws  . CYSTOSCOPY WITH INJECTION N/A 06/03/2013   Procedure: MACROPLASTIQUE  INJECTION ;  Surgeon: Malka So, MD;  Location: [facility];  Service: Urology;  Laterality: N/A;  . FISTULOGRAM Right 10/20/2016   Procedure: FISTULOGRAM WITH POSSIBLE INTERVENTION;  Surgeon: [name], MD;  Location: [facility];  Service: Vascular;  Laterality: Right;  . INSERTION OF DIALYSIS CATHETER N/A 11/26/2015   Procedure: INSERTION OF DIALYSIS CATHETER;  Surgeon: Angelia Mould, MD;  Location: [facility];  Service: Vascular;  Laterality: N/A;  . LAMINECTOMY  12/29/2011  . LUMBAR LAMINECTOMY/DECOMPRESSION MICRODISCECTOMY  12/29/2011   Procedure: LUMBAR LAMINECTOMY/DECOMPRESSION MICRODISCECTOMY;  Surgeon: Floyce Stakes, MD;  Location: [facility];  Service: Neurosurgery;  Laterality: N/A;  Lumbar Two through Lumbar Five Laminectomies/Cellsaver  . PARTIAL HYSTERECTOMY    . PERIPHERAL VASCULAR CATHETERIZATION N/A 11/22/2015   Procedure: Fistulagram;  Surgeon: Angelia Mould, MD;  Location: [facility];  Service: Cardiovascular;  Laterality: N/A;  . PERIPHERAL VASCULAR INTERVENTION  08/10/2017   Procedure: PERIPHERAL VASCULAR INTERVENTION;  Surgeon: Elam Dutch, MD;  Location: [facility];  Service: Cardiovascular;;  . SHOULDER SURGERY Right    Rotator cuff    ROS:  Review of Systems  All other systems reviewed and are negative.   PHYSICAL EXAM BP (!) 167/67   Pulse (!) 56   Ht 5\' 3"  (1.6 m)   Wt 178 lb (80.7 kg)   BMI 31.53 kg/m   GENERAL:  Well appearing NECK:  No jugular venous distention, waveform within normal limits, carotid upstroke brisk  and symmetric, positive bruits, no thyromegaly LUNGS:  Clear to auscultation bilaterally CHEST:  Unremarkable HEART:  PMI not displaced or sustained,S1 and S2 within normal limits, no S3, no S4, no clicks, no rubs, no murmurs ABD:  Flat, positive bowel sounds normal in frequency in pitch, no bruits, no rebound, no guarding, no midline pulsatile mass, no hepatomegaly, no splenomegaly EXT:  2 plus pulses upper and decreased dorsalis pedis and posterior tibialis bilateral, no edema, no cyanosis no clubbing right arm fistula ,a with thrill and bruit.    EKG:  Sinus rhythm, rate 58 , preexcitation, premature ventricular contraction, right axis deviation, poor anterior R wave progression.  Prolonged QT.   09/05/17  Lab Results  Component Value Date   CHOL 138 06/09/2016   TRIG 95 06/09/2016   HDL 42 06/09/2016   LDLCALC 77 06/09/2016    ASSESSMENT AND PLAN   CAD -  She had a negative submaximal stress test in Feb of 2017.  The patient has no new sypmtoms.  No further cardiovascular testing is indicated.  We will continue with aggressive risk reduction and meds as listed.  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  She is on a beta-blocker.  No change in therapy is indicated.  She should avoid QT prolonging drugs.   HYPERTENSION, BENIGN -  The blood pressure is at target.  No change in therapy.  DYSLIPIDEMIA -   I do not see a recent lipid profile.    However, total cholesterol was 182.  CHRONIC DIASTOLIC - She has her volume manage with dialysis.  No change in therapy.  BRUIT -  I will order Dopplers of  DM - A1C was 7.08.  This is followed by Eustaquio Maize, MD .  No change in therapy.

## 2018-05-01 ENCOUNTER — Encounter

## 2018-05-01 ENCOUNTER — Ambulatory Visit (INDEPENDENT_AMBULATORY_CARE_PROVIDER_SITE_OTHER): Payer: Medicare Other | Admitting: Cardiology

## 2018-05-01 ENCOUNTER — Encounter: Payer: Self-pay | Admitting: Pediatrics

## 2018-05-01 ENCOUNTER — Encounter: Payer: Self-pay | Admitting: Cardiology

## 2018-05-01 ENCOUNTER — Ambulatory Visit (INDEPENDENT_AMBULATORY_CARE_PROVIDER_SITE_OTHER): Payer: Medicare Other | Admitting: Pediatrics

## 2018-05-01 VITALS — BP 129/58 | HR 53 | Temp 97.4°F | Ht 63.74 in | Wt 177.6 lb

## 2018-05-01 VITALS — BP 167/67 | HR 56 | Ht 63.0 in | Wt 178.0 lb

## 2018-05-01 DIAGNOSIS — F411 Generalized anxiety disorder: Secondary | ICD-10-CM | POA: Diagnosis not present

## 2018-05-01 DIAGNOSIS — I2510 Atherosclerotic heart disease of native coronary artery without angina pectoris: Secondary | ICD-10-CM

## 2018-05-01 DIAGNOSIS — Z794 Long term (current) use of insulin: Secondary | ICD-10-CM

## 2018-05-01 DIAGNOSIS — R918 Other nonspecific abnormal finding of lung field: Secondary | ICD-10-CM

## 2018-05-01 DIAGNOSIS — R0989 Other specified symptoms and signs involving the circulatory and respiratory systems: Secondary | ICD-10-CM

## 2018-05-01 DIAGNOSIS — E118 Type 2 diabetes mellitus with unspecified complications: Secondary | ICD-10-CM

## 2018-05-01 DIAGNOSIS — E1121 Type 2 diabetes mellitus with diabetic nephropathy: Secondary | ICD-10-CM | POA: Diagnosis not present

## 2018-05-01 DIAGNOSIS — I10 Essential (primary) hypertension: Secondary | ICD-10-CM | POA: Diagnosis not present

## 2018-05-01 DIAGNOSIS — R911 Solitary pulmonary nodule: Secondary | ICD-10-CM

## 2018-05-01 DIAGNOSIS — I739 Peripheral vascular disease, unspecified: Secondary | ICD-10-CM

## 2018-05-01 DIAGNOSIS — E785 Hyperlipidemia, unspecified: Secondary | ICD-10-CM | POA: Diagnosis not present

## 2018-05-01 DIAGNOSIS — N186 End stage renal disease: Secondary | ICD-10-CM

## 2018-05-01 NOTE — Patient Instructions (Addendum)
Medication Instructions:  The current medical regimen is effective;  continue present plan and medications.  Testing/Procedures: Your physician has requested that you have a carotid duplex. This test is an ultrasound of the carotid arteries in your neck. It looks at blood flow through these arteries that supply the brain with blood. Allow one hour for this exam. There are no restrictions or special instructions.  Follow-Up: Follow up in 6 months with Dr. Hochrein.  You will receive a letter in the mail 2 months before you are due.  Please call us when you receive this letter to schedule your follow up appointment.  If you need a refill on your cardiac medications before your next appointment, please call your pharmacy.  Thank you for choosing [facility]!!     

## 2018-05-01 NOTE — Progress Notes (Signed)
  Subjective:   Patient ID: [name], female    DOB: 31-Aug-1951, 67 y.o.   MRN: [id number] CC: Hypertension (3 month follow up) and Diabetes  HPI: [name] is a 67 y.o. female  Gets labs through dialysis, next due in one month.  Diabetes: AM BGLs in 100s.  Taking medicine regularly.  Trying to avoid sugary foods.  Abd pain resolved.  No fevers.  Overall is been feeling well.  PAD: seen by vascular surgery in the past.  At that time was not yet on dialysis, they wanted to avoid any procedures using dye given chronic kidney disease.  She does have claudication symptoms with walking distances, having to stop frequently.  Is hoping to start walking more regularly to help with symptoms.  Lung nodule: Due for repeat imaging.  Hyperlipidemia: Taking medicine fairly regularly.  She does think she has some cramping in her legs after taking it.  Anxiety: Taking Lexapro regularly.  Anxiety controlled recently.  Relevant past medical, surgical, family and social history reviewed. Allergies and medications reviewed and updated. Social History   Tobacco Use  Smoking Status Former Smoker  . Packs/day: 1.00  . Years: 30.00  . Pack years: 30.00  . Last attempt to quit: 12/04/2001  . Years since quitting: 16.4  Smokeless Tobacco Never Used   ROS: Per HPI   Objective:    BP (!) 129/58   Pulse (!) 53   Temp (!) 97.4 F (36.3 C) (Oral)   Ht 5' 3.74" (1.619 m)   Wt 177 lb 9.6 oz (80.6 kg)   BMI 30.73 kg/m   Wt Readings from Last 3 Encounters:  05/01/18 178 lb (80.7 kg)  05/01/18 177 lb 9.6 oz (80.6 kg)  01/28/18 182 lb 9.6 oz (82.8 kg)    Gen: NAD, alert, cooperative with exam, NCAT EYES: EOMI, no conjunctival injection, or no icterus ENT:  TMs pearly gray b/l, OP without erythema LYMPH: no cervical LAD CV: NRRR, normal S1/S2, diminished DP pulses bilaterally Resp: CTABL, no wheezes, normal WOB Abd: +BS, soft, NTND. no guarding or organomegaly Ext: No edema in LE,  warm Neuro: Alert and oriented  Assessment & Plan:  [name] was seen today for hypertension and diabetes.  Diagnoses and all orders for this visit:  Hyperlipidemia, unspecified hyperlipidemia type Stable, continue current med  Essential (primary) hypertension Stable, continue current meds  PAD (peripheral artery disease) ([place]) Encouraged follow-up with vascular surgery.  Patient declines.  Will revisit next visit.  Type II diabetes mellitus with nephropathy (HCC) Stable, continue current medicines.  End stage renal disease ([place]) Regularly going to dialysis.  GAD (generalized anxiety disorder) Stable, continue Lexapro  Abnormal findings on diagnostic imaging of lung due for follow-up CT scan for lung nodule.  -     CT Chest Wo Contrast; Future  Lung nodule   Follow up plan: Return in about 4 months (around 09/04/2018). [name], MD [place]

## 2018-05-02 DIAGNOSIS — N2581 Secondary hyperparathyroidism of renal origin: Secondary | ICD-10-CM | POA: Diagnosis not present

## 2018-05-02 DIAGNOSIS — N186 End stage renal disease: Secondary | ICD-10-CM | POA: Diagnosis not present

## 2018-05-02 DIAGNOSIS — D631 Anemia in chronic kidney disease: Secondary | ICD-10-CM | POA: Diagnosis not present

## 2018-05-02 DIAGNOSIS — D509 Iron deficiency anemia, unspecified: Secondary | ICD-10-CM | POA: Diagnosis not present

## 2018-05-02 DIAGNOSIS — Z992 Dependence on renal dialysis: Secondary | ICD-10-CM | POA: Diagnosis not present

## 2018-05-03 DIAGNOSIS — N186 End stage renal disease: Secondary | ICD-10-CM | POA: Diagnosis not present

## 2018-05-03 DIAGNOSIS — Z992 Dependence on renal dialysis: Secondary | ICD-10-CM | POA: Diagnosis not present

## 2018-05-04 DIAGNOSIS — Z992 Dependence on renal dialysis: Secondary | ICD-10-CM | POA: Diagnosis not present

## 2018-05-04 DIAGNOSIS — N186 End stage renal disease: Secondary | ICD-10-CM | POA: Diagnosis not present

## 2018-05-04 DIAGNOSIS — N2581 Secondary hyperparathyroidism of renal origin: Secondary | ICD-10-CM | POA: Diagnosis not present

## 2018-05-04 DIAGNOSIS — D509 Iron deficiency anemia, unspecified: Secondary | ICD-10-CM | POA: Diagnosis not present

## 2018-05-04 DIAGNOSIS — D631 Anemia in chronic kidney disease: Secondary | ICD-10-CM | POA: Diagnosis not present

## 2018-05-07 DIAGNOSIS — N2581 Secondary hyperparathyroidism of renal origin: Secondary | ICD-10-CM | POA: Diagnosis not present

## 2018-05-07 DIAGNOSIS — D509 Iron deficiency anemia, unspecified: Secondary | ICD-10-CM | POA: Diagnosis not present

## 2018-05-07 DIAGNOSIS — N186 End stage renal disease: Secondary | ICD-10-CM | POA: Diagnosis not present

## 2018-05-07 DIAGNOSIS — Z992 Dependence on renal dialysis: Secondary | ICD-10-CM | POA: Diagnosis not present

## 2018-05-07 DIAGNOSIS — D631 Anemia in chronic kidney disease: Secondary | ICD-10-CM | POA: Diagnosis not present

## 2018-05-09 DIAGNOSIS — D509 Iron deficiency anemia, unspecified: Secondary | ICD-10-CM | POA: Diagnosis not present

## 2018-05-09 DIAGNOSIS — Z992 Dependence on renal dialysis: Secondary | ICD-10-CM | POA: Diagnosis not present

## 2018-05-09 DIAGNOSIS — N2581 Secondary hyperparathyroidism of renal origin: Secondary | ICD-10-CM | POA: Diagnosis not present

## 2018-05-09 DIAGNOSIS — D631 Anemia in chronic kidney disease: Secondary | ICD-10-CM | POA: Diagnosis not present

## 2018-05-09 DIAGNOSIS — N186 End stage renal disease: Secondary | ICD-10-CM | POA: Diagnosis not present

## 2018-05-11 DIAGNOSIS — D631 Anemia in chronic kidney disease: Secondary | ICD-10-CM | POA: Diagnosis not present

## 2018-05-11 DIAGNOSIS — Z992 Dependence on renal dialysis: Secondary | ICD-10-CM | POA: Diagnosis not present

## 2018-05-11 DIAGNOSIS — N186 End stage renal disease: Secondary | ICD-10-CM | POA: Diagnosis not present

## 2018-05-11 DIAGNOSIS — D509 Iron deficiency anemia, unspecified: Secondary | ICD-10-CM | POA: Diagnosis not present

## 2018-05-11 DIAGNOSIS — N2581 Secondary hyperparathyroidism of renal origin: Secondary | ICD-10-CM | POA: Diagnosis not present

## 2018-05-13 ENCOUNTER — Other Ambulatory Visit: Payer: Self-pay | Admitting: Cardiology

## 2018-05-13 DIAGNOSIS — I6523 Occlusion and stenosis of bilateral carotid arteries: Secondary | ICD-10-CM

## 2018-05-13 DIAGNOSIS — R0989 Other specified symptoms and signs involving the circulatory and respiratory systems: Secondary | ICD-10-CM

## 2018-05-14 DIAGNOSIS — D509 Iron deficiency anemia, unspecified: Secondary | ICD-10-CM | POA: Diagnosis not present

## 2018-05-14 DIAGNOSIS — D631 Anemia in chronic kidney disease: Secondary | ICD-10-CM | POA: Diagnosis not present

## 2018-05-14 DIAGNOSIS — N2581 Secondary hyperparathyroidism of renal origin: Secondary | ICD-10-CM | POA: Diagnosis not present

## 2018-05-14 DIAGNOSIS — Z992 Dependence on renal dialysis: Secondary | ICD-10-CM | POA: Diagnosis not present

## 2018-05-14 DIAGNOSIS — N186 End stage renal disease: Secondary | ICD-10-CM | POA: Diagnosis not present

## 2018-05-15 ENCOUNTER — Ambulatory Visit (HOSPITAL_COMMUNITY)
Admission: RE | Admit: 2018-05-15 | Discharge: 2018-05-15 | Disposition: A | Discharge status: Home / Self Care | Payer: Medicare Other | Source: Ambulatory Visit | Attending: Cardiology | Admitting: Cardiology

## 2018-05-15 DIAGNOSIS — E785 Hyperlipidemia, unspecified: Secondary | ICD-10-CM | POA: Insufficient documentation

## 2018-05-15 DIAGNOSIS — E119 Type 2 diabetes mellitus without complications: Secondary | ICD-10-CM | POA: Insufficient documentation

## 2018-05-15 DIAGNOSIS — I6523 Occlusion and stenosis of bilateral carotid arteries: Secondary | ICD-10-CM | POA: Diagnosis not present

## 2018-05-15 DIAGNOSIS — R51 Headache: Secondary | ICD-10-CM | POA: Insufficient documentation

## 2018-05-15 DIAGNOSIS — I10 Essential (primary) hypertension: Secondary | ICD-10-CM | POA: Diagnosis not present

## 2018-05-15 DIAGNOSIS — I2510 Atherosclerotic heart disease of native coronary artery without angina pectoris: Secondary | ICD-10-CM | POA: Diagnosis not present

## 2018-05-15 DIAGNOSIS — Z87891 Personal history of nicotine dependence: Secondary | ICD-10-CM | POA: Diagnosis not present

## 2018-05-15 DIAGNOSIS — R0989 Other specified symptoms and signs involving the circulatory and respiratory systems: Secondary | ICD-10-CM | POA: Diagnosis not present

## 2018-05-16 DIAGNOSIS — N186 End stage renal disease: Secondary | ICD-10-CM | POA: Diagnosis not present

## 2018-05-16 DIAGNOSIS — D631 Anemia in chronic kidney disease: Secondary | ICD-10-CM | POA: Diagnosis not present

## 2018-05-16 DIAGNOSIS — D509 Iron deficiency anemia, unspecified: Secondary | ICD-10-CM | POA: Diagnosis not present

## 2018-05-16 DIAGNOSIS — Z992 Dependence on renal dialysis: Secondary | ICD-10-CM | POA: Diagnosis not present

## 2018-05-16 DIAGNOSIS — N2581 Secondary hyperparathyroidism of renal origin: Secondary | ICD-10-CM | POA: Diagnosis not present

## 2018-05-18 DIAGNOSIS — D631 Anemia in chronic kidney disease: Secondary | ICD-10-CM | POA: Diagnosis not present

## 2018-05-18 DIAGNOSIS — N2581 Secondary hyperparathyroidism of renal origin: Secondary | ICD-10-CM | POA: Diagnosis not present

## 2018-05-18 DIAGNOSIS — Z992 Dependence on renal dialysis: Secondary | ICD-10-CM | POA: Diagnosis not present

## 2018-05-18 DIAGNOSIS — D509 Iron deficiency anemia, unspecified: Secondary | ICD-10-CM | POA: Diagnosis not present

## 2018-05-18 DIAGNOSIS — N186 End stage renal disease: Secondary | ICD-10-CM | POA: Diagnosis not present

## 2018-05-20 ENCOUNTER — Ambulatory Visit (HOSPITAL_COMMUNITY)
Admission: RE | Admit: 2018-05-20 | Discharge: 2018-05-20 | Disposition: A | Discharge status: Home / Self Care | Payer: Medicare Other | Source: Ambulatory Visit | Attending: Pediatrics | Admitting: Pediatrics

## 2018-05-20 DIAGNOSIS — R918 Other nonspecific abnormal finding of lung field: Secondary | ICD-10-CM | POA: Diagnosis not present

## 2018-05-20 DIAGNOSIS — I700 Atherosclerosis of aorta: Secondary | ICD-10-CM | POA: Diagnosis not present

## 2018-05-21 DIAGNOSIS — D509 Iron deficiency anemia, unspecified: Secondary | ICD-10-CM | POA: Diagnosis not present

## 2018-05-21 DIAGNOSIS — D631 Anemia in chronic kidney disease: Secondary | ICD-10-CM | POA: Diagnosis not present

## 2018-05-21 DIAGNOSIS — N186 End stage renal disease: Secondary | ICD-10-CM | POA: Diagnosis not present

## 2018-05-21 DIAGNOSIS — N2581 Secondary hyperparathyroidism of renal origin: Secondary | ICD-10-CM | POA: Diagnosis not present

## 2018-05-21 DIAGNOSIS — Z992 Dependence on renal dialysis: Secondary | ICD-10-CM | POA: Diagnosis not present

## 2018-05-23 DIAGNOSIS — N2581 Secondary hyperparathyroidism of renal origin: Secondary | ICD-10-CM | POA: Diagnosis not present

## 2018-05-23 DIAGNOSIS — D509 Iron deficiency anemia, unspecified: Secondary | ICD-10-CM | POA: Diagnosis not present

## 2018-05-23 DIAGNOSIS — N186 End stage renal disease: Secondary | ICD-10-CM | POA: Diagnosis not present

## 2018-05-23 DIAGNOSIS — D631 Anemia in chronic kidney disease: Secondary | ICD-10-CM | POA: Diagnosis not present

## 2018-05-23 DIAGNOSIS — Z992 Dependence on renal dialysis: Secondary | ICD-10-CM | POA: Diagnosis not present

## 2018-05-24 ENCOUNTER — Telehealth: Payer: Self-pay | Admitting: Cardiology

## 2018-05-24 NOTE — Telephone Encounter (Signed)
Notes recorded by Minus Breeding, MD on 05/17/2018 at 12:15 PM EDT Mild plaque. I will follow up with a repeat study in a couple of years. Call Ms. [name] with the results and send results to Tamara Maize, MD  Pt aware of results and was pleased.

## 2018-05-24 NOTE — Telephone Encounter (Signed)
New Message:       Pt is returning a call for test results

## 2018-05-25 DIAGNOSIS — D631 Anemia in chronic kidney disease: Secondary | ICD-10-CM | POA: Diagnosis not present

## 2018-05-25 DIAGNOSIS — N186 End stage renal disease: Secondary | ICD-10-CM | POA: Diagnosis not present

## 2018-05-25 DIAGNOSIS — Z992 Dependence on renal dialysis: Secondary | ICD-10-CM | POA: Diagnosis not present

## 2018-05-25 DIAGNOSIS — N2581 Secondary hyperparathyroidism of renal origin: Secondary | ICD-10-CM | POA: Diagnosis not present

## 2018-05-25 DIAGNOSIS — D509 Iron deficiency anemia, unspecified: Secondary | ICD-10-CM | POA: Diagnosis not present

## 2018-05-28 DIAGNOSIS — Z794 Long term (current) use of insulin: Secondary | ICD-10-CM | POA: Diagnosis not present

## 2018-05-28 DIAGNOSIS — D509 Iron deficiency anemia, unspecified: Secondary | ICD-10-CM | POA: Diagnosis not present

## 2018-05-28 DIAGNOSIS — Z992 Dependence on renal dialysis: Secondary | ICD-10-CM | POA: Diagnosis not present

## 2018-05-28 DIAGNOSIS — E119 Type 2 diabetes mellitus without complications: Secondary | ICD-10-CM | POA: Diagnosis not present

## 2018-05-28 DIAGNOSIS — N186 End stage renal disease: Secondary | ICD-10-CM | POA: Diagnosis not present

## 2018-05-28 DIAGNOSIS — N2581 Secondary hyperparathyroidism of renal origin: Secondary | ICD-10-CM | POA: Diagnosis not present

## 2018-05-28 DIAGNOSIS — D631 Anemia in chronic kidney disease: Secondary | ICD-10-CM | POA: Diagnosis not present

## 2018-05-30 DIAGNOSIS — N186 End stage renal disease: Secondary | ICD-10-CM | POA: Diagnosis not present

## 2018-05-30 DIAGNOSIS — D509 Iron deficiency anemia, unspecified: Secondary | ICD-10-CM | POA: Diagnosis not present

## 2018-05-30 DIAGNOSIS — Z992 Dependence on renal dialysis: Secondary | ICD-10-CM | POA: Diagnosis not present

## 2018-05-30 DIAGNOSIS — D631 Anemia in chronic kidney disease: Secondary | ICD-10-CM | POA: Diagnosis not present

## 2018-05-30 DIAGNOSIS — N2581 Secondary hyperparathyroidism of renal origin: Secondary | ICD-10-CM | POA: Diagnosis not present

## 2018-06-01 DIAGNOSIS — Z992 Dependence on renal dialysis: Secondary | ICD-10-CM | POA: Diagnosis not present

## 2018-06-01 DIAGNOSIS — N186 End stage renal disease: Secondary | ICD-10-CM | POA: Diagnosis not present

## 2018-06-01 DIAGNOSIS — D631 Anemia in chronic kidney disease: Secondary | ICD-10-CM | POA: Diagnosis not present

## 2018-06-01 DIAGNOSIS — D509 Iron deficiency anemia, unspecified: Secondary | ICD-10-CM | POA: Diagnosis not present

## 2018-06-01 DIAGNOSIS — N2581 Secondary hyperparathyroidism of renal origin: Secondary | ICD-10-CM | POA: Diagnosis not present

## 2018-06-02 DIAGNOSIS — Z992 Dependence on renal dialysis: Secondary | ICD-10-CM | POA: Diagnosis not present

## 2018-06-02 DIAGNOSIS — N186 End stage renal disease: Secondary | ICD-10-CM | POA: Diagnosis not present

## 2018-06-04 DIAGNOSIS — D509 Iron deficiency anemia, unspecified: Secondary | ICD-10-CM | POA: Diagnosis not present

## 2018-06-04 DIAGNOSIS — N2581 Secondary hyperparathyroidism of renal origin: Secondary | ICD-10-CM | POA: Diagnosis not present

## 2018-06-04 DIAGNOSIS — N186 End stage renal disease: Secondary | ICD-10-CM | POA: Diagnosis not present

## 2018-06-04 DIAGNOSIS — Z992 Dependence on renal dialysis: Secondary | ICD-10-CM | POA: Diagnosis not present

## 2018-06-04 DIAGNOSIS — D631 Anemia in chronic kidney disease: Secondary | ICD-10-CM | POA: Diagnosis not present

## 2018-06-06 DIAGNOSIS — N2581 Secondary hyperparathyroidism of renal origin: Secondary | ICD-10-CM | POA: Diagnosis not present

## 2018-06-06 DIAGNOSIS — D509 Iron deficiency anemia, unspecified: Secondary | ICD-10-CM | POA: Diagnosis not present

## 2018-06-06 DIAGNOSIS — Z992 Dependence on renal dialysis: Secondary | ICD-10-CM | POA: Diagnosis not present

## 2018-06-06 DIAGNOSIS — D631 Anemia in chronic kidney disease: Secondary | ICD-10-CM | POA: Diagnosis not present

## 2018-06-06 DIAGNOSIS — N186 End stage renal disease: Secondary | ICD-10-CM | POA: Diagnosis not present

## 2018-06-08 DIAGNOSIS — D631 Anemia in chronic kidney disease: Secondary | ICD-10-CM | POA: Diagnosis not present

## 2018-06-08 DIAGNOSIS — Z992 Dependence on renal dialysis: Secondary | ICD-10-CM | POA: Diagnosis not present

## 2018-06-08 DIAGNOSIS — D509 Iron deficiency anemia, unspecified: Secondary | ICD-10-CM | POA: Diagnosis not present

## 2018-06-08 DIAGNOSIS — N186 End stage renal disease: Secondary | ICD-10-CM | POA: Diagnosis not present

## 2018-06-08 DIAGNOSIS — N2581 Secondary hyperparathyroidism of renal origin: Secondary | ICD-10-CM | POA: Diagnosis not present

## 2018-06-11 DIAGNOSIS — N186 End stage renal disease: Secondary | ICD-10-CM | POA: Diagnosis not present

## 2018-06-11 DIAGNOSIS — D631 Anemia in chronic kidney disease: Secondary | ICD-10-CM | POA: Diagnosis not present

## 2018-06-11 DIAGNOSIS — N2581 Secondary hyperparathyroidism of renal origin: Secondary | ICD-10-CM | POA: Diagnosis not present

## 2018-06-11 DIAGNOSIS — Z992 Dependence on renal dialysis: Secondary | ICD-10-CM | POA: Diagnosis not present

## 2018-06-11 DIAGNOSIS — D509 Iron deficiency anemia, unspecified: Secondary | ICD-10-CM | POA: Diagnosis not present

## 2018-06-13 DIAGNOSIS — D631 Anemia in chronic kidney disease: Secondary | ICD-10-CM | POA: Diagnosis not present

## 2018-06-13 DIAGNOSIS — Z992 Dependence on renal dialysis: Secondary | ICD-10-CM | POA: Diagnosis not present

## 2018-06-13 DIAGNOSIS — N2581 Secondary hyperparathyroidism of renal origin: Secondary | ICD-10-CM | POA: Diagnosis not present

## 2018-06-13 DIAGNOSIS — D509 Iron deficiency anemia, unspecified: Secondary | ICD-10-CM | POA: Diagnosis not present

## 2018-06-13 DIAGNOSIS — N186 End stage renal disease: Secondary | ICD-10-CM | POA: Diagnosis not present

## 2018-06-15 ENCOUNTER — Other Ambulatory Visit: Payer: Self-pay | Admitting: Pediatrics

## 2018-06-15 DIAGNOSIS — E1121 Type 2 diabetes mellitus with diabetic nephropathy: Secondary | ICD-10-CM

## 2018-06-15 DIAGNOSIS — N186 End stage renal disease: Secondary | ICD-10-CM | POA: Diagnosis not present

## 2018-06-15 DIAGNOSIS — N2581 Secondary hyperparathyroidism of renal origin: Secondary | ICD-10-CM | POA: Diagnosis not present

## 2018-06-15 DIAGNOSIS — I10 Essential (primary) hypertension: Secondary | ICD-10-CM

## 2018-06-15 DIAGNOSIS — D631 Anemia in chronic kidney disease: Secondary | ICD-10-CM | POA: Diagnosis not present

## 2018-06-15 DIAGNOSIS — D509 Iron deficiency anemia, unspecified: Secondary | ICD-10-CM | POA: Diagnosis not present

## 2018-06-15 DIAGNOSIS — Z992 Dependence on renal dialysis: Secondary | ICD-10-CM | POA: Diagnosis not present

## 2018-06-17 ENCOUNTER — Ambulatory Visit (INDEPENDENT_AMBULATORY_CARE_PROVIDER_SITE_OTHER): Payer: Medicare Other | Admitting: *Deleted

## 2018-06-17 ENCOUNTER — Encounter: Payer: Self-pay | Admitting: *Deleted

## 2018-06-17 VITALS — BP 128/60 | HR 56 | Ht 63.0 in | Wt 181.0 lb

## 2018-06-17 DIAGNOSIS — Z0000 Encounter for general adult medical examination without abnormal findings: Secondary | ICD-10-CM | POA: Diagnosis not present

## 2018-06-17 DIAGNOSIS — F419 Anxiety disorder, unspecified: Secondary | ICD-10-CM

## 2018-06-17 MED ORDER — ESCITALOPRAM OXALATE 10 MG PO TABS: 10.0000 mg | ORAL_TABLET | Freq: Every day | ORAL | 1 refills | Status: DC

## 2018-06-17 NOTE — Progress Notes (Signed)
Subjective:   [name] is a 67 y.o. female who presents for a Medicare Annual Wellness Visit. [name] lives at home alone but is within walking distance to her mother. She has two adult daughters who live locally. Her husband passed away 40 years ago in a drowning accident. She never remarried. She has been going to [facility] for 3 years for CKD and is currently going 3 days a week. She is very compliant with their instructions and has a very positive attitude about her treatment. She does do very much physical activity but does enjoy playing computer games.   Review of Systems    Patient reports that her overall health is unchanged compared to last year.  Cardiac Risk Factors include: advanced age (>51men, >44 women);dyslipidemia;microalbuminuria;sedentary lifestyle;hypertension;diabetes mellitus   All other systems negative       Current Medications (verified) Outpatient Encounter Medications as of 06/17/2018  Medication Sig  . albuterol (PROVENTIL HFA;VENTOLIN HFA) 108 (90 Base) MCG/ACT inhaler Inhale 2 puffs into the lungs every 6 (six) hours as needed for wheezing or shortness of breath.  [name] albuterol (PROVENTIL) (2.5 MG/3ML) 0.083% nebulizer solution Take 3 mLs (2.5 mg total) by nebulization every 6 (six) hours as needed for wheezing or shortness of breath.  [name] aspirin 81 MG EC tablet Take 81 mg by mouth daily.    [name] atenolol (TENORMIN) 50 MG tablet TAKE (1) TABLET TWICE DAILY.  . budesonide-formoterol (SYMBICORT) 160-4.5 MCG/ACT inhaler Inhale 2 puffs into the lungs 2 (two) times daily as needed.  . calcium acetate (PHOSLO) 667 MG capsule Take 667-1,334 mg by mouth See admin instructions. Take 2 capsules with meals 3 times daily and 1 capsule with snacks  . Carboxymethylcellulose Sodium (THERATEARS OP) Place 2 drops into both eyes as needed (for dry eyes). Reported on 12/09/2015  . cyclobenzaprine (FLEXERIL) 5 MG tablet Take 1 tablet (5 mg total) by mouth 3 (three) times  daily as needed for muscle spasms.  [name] docusate sodium (COLACE) 100 MG capsule Take 100 mg by mouth 2 (two) times daily as needed for mild constipation.  [name] escitalopram (LEXAPRO) 10 MG tablet Take 1 tablet (10 mg total) by mouth daily.  [name] glucose blood test strip Test 4 times daily  . hydrALAZINE (APRESOLINE) 50 MG tablet Take 1 tablet (50 mg total) by mouth 2 (two) times daily.  . Insulin Glargine (LANTUS SOLOSTAR) 100 UNIT/ML Solostar Pen Inject 15-25 Units into the skin daily at 10 pm.  . LORazepam (ATIVAN) 0.5 MG tablet Take 1 tablet (0.5 mg total) by mouth every 8 (eight) hours as needed for anxiety.  [name] NIFEdipine (PROCARDIA XL/ADALAT-CC) 90 MG 24 hr tablet Take 90 mg by mouth every evening.   . nitroGLYCERIN (NITROSTAT) 0.4 MG SL tablet Place 1 tablet (0.4 mg total) under the tongue every 5 (five) minutes as needed for chest pain.  . pravastatin (PRAVACHOL) 20 MG tablet TAKE (1) TABLET BY MOUTH ONCE DAILY.  [name] torsemide (DEMADEX) 20 MG tablet 20 mg. Take two tablets by mouth once daily  . [DISCONTINUED] escitalopram (LEXAPRO) 10 MG tablet Take 1 tablet (10 mg total) by mouth daily.   No facility-administered encounter medications on file as of 06/17/2018.     Allergies (verified) Azithromycin; Ace inhibitors; Clopidogrel bisulfate; Codeine; Lisinopril; and Penicillins   History: Past Medical History:  Diagnosis Date  . Acute on chronic diastolic CHF (congestive heart failure) ([place]) 03/05/2015   Grade 2. EF 60-65%  . Anemia    hx  of  . Anxiety   . Arthritis   . CAD (coronary artery disease)    s/p Stenting in 2005;  [place] 7/09: Vigorous LV function, 2-3+ MR, RI 40%, RI stent patent, proximal-mid RCA 40-50%;  [place] 7/09:  EF 55-65%, trivial MR;  [place] 11/18/12: EF 66%, normal LV wall motion, mild to moderate anterior ischemia - reviewed by [facility] and felt to rep breast atten (low risk);  Echo 6/14: mild LVH, EF 55-65%, Gr 2 DD, PASP 44, trivial eff   . Chronic kidney disease    CREATININE IS  UP--GOING TO KIDNEY MD   . Chronic neck pain   . COPD (chronic obstructive pulmonary disease) ([place])   . Degenerative joint disease    left shoulder  . Diabetic neuropathy ([place])   . Diverticulosis   . Esophageal stricture   . GERD (gastroesophageal reflux disease)   . Hyperlipidemia   . Hypertension   . IDDM (insulin dependent diabetes mellitus) ([place])   . Pericardial effusion 03/06/2015   Very small per echo ; pleural nodules seen on CT chest.  . Peripheral vascular disease ([place])   . Stroke [facility])    "mini- years ago"  . Vitamin D deficiency    Past Surgical History:  Procedure Laterality Date  . A/V FISTULAGRAM N/A 08/10/2017   Procedure: A/V Fistulagram - Right Arm;  Surgeon: Elam Dutch, MD;  Location: [place] [facility];  Service: Cardiovascular;  Laterality: N/A;  . ABDOMINAL HYSTERECTOMY    . AV FISTULA PLACEMENT Right 07/20/2015   Procedure: RADIAL CEPHALIC ARTERIOVENOUS FISTULA CREATION RIGHT ARM;  Surgeon: Angelia Mould, MD;  Location: [place];  Service: Vascular;  Laterality: Right;  . AV FISTULA PLACEMENT Right 11/26/2015   Procedure:  Creation of RIGHT BRACHIO-CEPHALIC arteriovenous fistula;  Surgeon: Angelia Mould, MD;  Location: [place];  Service: Vascular;  Laterality: Right;  . BACK SURGERY     2007 & 2013  . BLADDER SURGERY     Tack  . CARDIAC CATHETERIZATION  2003, 2005, 2009   1 stent  . cardiac stents    . CATARACT EXTRACTION W/PHACO Right 05/04/2014   Procedure: CATARACT EXTRACTION PHACO AND INTRAOCULAR LENS PLACEMENT (IOC);  Surgeon: Williams Che, MD;  Location: [facility];  Service: Ophthalmology;  Laterality: Right;  CDE 1.47  . CATARACT EXTRACTION W/PHACO Left 07/20/2014   Procedure: CATARACT EXTRACTION WITH PHACO AND INTRAOCULAR LENS PLACEMENT LEFT EYE CDE=1.79;  Surgeon: Williams Che, MD;  Location: [facility];  Service: Ophthalmology;  Laterality: Left;  . CERVICAL SPINE SURGERY  2012   8 screws  . CYSTOSCOPY WITH INJECTION N/A 06/03/2013    Procedure: MACROPLASTIQUE  INJECTION ;  Surgeon: Malka So, MD;  Location: [facility];  Service: Urology;  Laterality: N/A;  . FISTULOGRAM Right 10/20/2016   Procedure: FISTULOGRAM WITH POSSIBLE INTERVENTION;  Surgeon: [name], MD;  Location: [facility];  Service: Vascular;  Laterality: Right;  . INSERTION OF DIALYSIS CATHETER N/A 11/26/2015   Procedure: INSERTION OF DIALYSIS CATHETER;  Surgeon: Angelia Mould, MD;  Location: [place];  Service: Vascular;  Laterality: N/A;  . LAMINECTOMY  12/29/2011  . LUMBAR LAMINECTOMY/DECOMPRESSION MICRODISCECTOMY  12/29/2011   Procedure: LUMBAR LAMINECTOMY/DECOMPRESSION MICRODISCECTOMY;  Surgeon: Floyce Stakes, MD;  Location: [place] [facility];  Service: Neurosurgery;  Laterality: N/A;  Lumbar Two through Lumbar Five Laminectomies/Cellsaver  . PARTIAL HYSTERECTOMY    . PERIPHERAL VASCULAR CATHETERIZATION N/A 11/22/2015   Procedure: Fistulagram;  Surgeon: Angelia Mould, MD;  Location: [place] [facility];  Service: Cardiovascular;  Laterality: N/A;  . PERIPHERAL VASCULAR INTERVENTION  08/10/2017   Procedure: PERIPHERAL VASCULAR INTERVENTION;  Surgeon: Elam Dutch, MD;  Location: [place] [facility];  Service: Cardiovascular;;  . SHOULDER SURGERY Right    Rotator cuff   Family History  Problem Relation Age of Onset  . Heart disease Other   . Hypertension Father   . Cancer Father   . Hypertension Daughter   . Pancreatitis Brother   . Healthy Daughter    Social History   Socioeconomic History  . Marital status: Widowed    Spouse name: Not on file  . Number of children: 2  . Years of education: GED  . Highest education level: GED or equivalent  Occupational History  . Occupation: Disabled  Social Needs  . Financial resource strain: Not very hard  . Food insecurity:    Worry: Never true    Inability: Never true  . Transportation needs:    Medical: No    Non-medical: No  Tobacco Use  . Smoking status: Former Smoker     Packs/day: 1.00    Years: 30.00    Pack years: 30.00    Last attempt to quit: 12/04/2001    Years since quitting: 16.5  . Smokeless tobacco: Never Used  Substance and Sexual Activity  . Alcohol use: No    Alcohol/week: 3.6 oz    Types: 6 Cans of beer per week  . Drug use: No  . Sexual activity: Not Currently    Birth control/protection: Surgical  Lifestyle  . Physical activity:    Days per week: 0 days    Minutes per session: 0 min  . Stress: Only a little  Relationships  . Social connections:    Talks on phone: More than three times a week    Gets together: More than three times a week    Attends religious service: More than 4 times per year    Active member of club or organization: Yes    Attends meetings of clubs or organizations: More than 4 times per year    Relationship status: Widowed  Other Topics Concern  . Not on file  Social History Narrative   Lives in [place]    Tobacco Use No.  Clinical Intake:     Pain : No/denies pain     Nutritional Status: BMI > 30  Obese Diabetes: Yes CBG done?: No Did pt. bring in CBG monitor from home?: No  How often do you need to have someone help you when you read instructions, pamphlets, or other written materials from your doctor or pharmacy?: 1 - Never What is the last grade level you completed in school?: GED     Information entered by :: Chong Sicilian, RN   Activities of Daily Living In your present state of health, do you have any difficulty performing the following activities: 06/17/2018 08/10/2017  Hearing? N N  Vision? N N  Difficulty concentrating or making decisions? N N  Walking or climbing stairs? N N  Dressing or bathing? N N  Doing errands, shopping? N -  Preparing Food and eating ? N -  Using the Toilet? N -  In the past six months, have you accidently leaked urine? N -  Do you have problems with loss of bowel control? N -  Managing your Medications? N -  Managing your Finances? N -    Housekeeping or managing your Housekeeping? N -  Some  recent data might be hidden     Diet 3 meals a day. Adheres to diet recommended by nutritionist at dialysis center  Exercise Current Exercise Habits: The patient does not participate in regular exercise at present, Exercise limited by: Other - see comments;respiratory conditions(s)(CKD)   Depression Screen PHQ 2/9 Scores 06/17/2018 05/01/2018 01/28/2018 01/09/2018 10/24/2017 09/26/2017 09/17/2017  PHQ - 2 Score 0 0 0 0 0 0 0     Fall Risk Fall Risk  06/17/2018 05/01/2018 01/28/2018 01/09/2018 10/24/2017  Falls in the past year? No No No No No    Safety Is the patient's home free of loose throw rugs in walkways, pet beds, electrical cords, etc?   yes      Grab bars in the bathroom? no      Walkin shower? no      Shower Seat? no      Handrails on the stairs?   yes      Adequate lighting?   yes  Patient Care Team: Eustaquio Maize, MD as PCP - General (Pediatrics) Fran Lowes, MD as Consulting Physician (Nephrology) [name], MD as Referring Physician (Family Medicine) Minus Breeding, MD as Consulting Physician (Cardiology) [place], [place]   No hospitalizations, ER visits, or surgeries this past year.  Objective:    Today's Vitals   06/17/18 1514  BP: 128/60  Pulse: (!) 56  Weight: 181 lb (82.1 kg)  Height: 5\' 3"  (1.6 m)   Body mass index is 32.06 kg/m.  Advanced Directives 06/17/2018 08/10/2017 10/19/2016 10/16/2016 10/06/2016 07/12/2016 11/22/2015  Does Patient Have a Medical Advance Directive? No No No No No No -  Would patient like information on creating a medical advance directive? No - Patient declined Yes (MAU/Ambulatory/Procedural Areas - Information given) No - patient declined information No - patient declined information No - patient declined information No - patient declined information No - patient declined information  Pre-existing out of facility DNR order (yellow form  or pink MOST form) - - - - - - -    Hearing/Vision  normal or No deficits noted during visit.  Cognitive Function: MMSE - Mini Mental State Exam 06/17/2018 10/06/2016  Orientation to time 5 5  Orientation to Place 5 5  Registration 3 3  Attention/ Calculation 5 5  Recall 1 3  Language- name 2 objects 2 2  Language- repeat 1 1  Language- follow 3 step command 3 3  Language- read & follow direction 1 1  Write a sentence 1 1  Copy design 1 1  Total score 28 30       Normal Cognitive Function Screening: Yes    Immunizations and Health Maintenance Immunization History  Administered Date(s) Administered  . DTaP 07/19/2010  . Influenza Split 10/04/2013  . Influenza Whole 09/03/2006  . Influenza,inj,Quad PF,6+ Mos 09/11/2013, 09/09/2014  . Influenza-Unspecified 09/14/2015, 08/25/2016  . PPD Test 11/23/2015  . Pneumococcal Conjugate-13 07/07/2017  . Pneumococcal Polysaccharide-23 08/26/2012  . Tdap 08/24/2011   There are no preventive care reminders to display for this patient. Health Maintenance  Topic Date Due  . FOOT EXAM  09/17/2018 (Originally 09/13/2017)  . INFLUENZA VACCINE  07/04/2018  . PNA vac Low Risk Adult (2 of 2 - PPSV23) 07/07/2018  . HEMOGLOBIN A1C  07/28/2018  . MAMMOGRAM  11/16/2018  . OPHTHALMOLOGY EXAM  03/26/2019  . TETANUS/TDAP  08/23/2021  . COLONOSCOPY  11/03/2024  . DEXA SCAN  Completed  . Hepatitis C Screening  Completed  .  HIV Screening  Completed        Assessment:   This is a routine wellness examination for Alyha.    Plan:    Goals    . Exercise 3x per week (30 min per time)     Walk for 30 minutes 3 times per week        Health Maintenance Recommendations: No recommendations at this time   Additional Screening Recommendations: Lung: Low Dose CT Chest recommended if Age 27-80 years, 30 pack-year currently smoking OR have quit w/in 15years. Patient does not qualify. Hepatitis C Screening recommended: no  Today's  Orders No orders of the defined types were placed in this encounter.   Keep f/u with Eustaquio Maize, MD and any other specialty appointments you may have Continue current medications Move carefully to avoid falls Increase activity level slowly. Avoid the heat of the day when exercising. Ultimate goal of at least 150 minutes a week Read or work on puzzles daily Stay connected with friends and family Will request colonoscopy report from Dr Amedeo Plenty at [facility]  I have personally reviewed and noted the following in the patient's chart:   . Medical and social history . Use of alcohol, tobacco or illicit drugs  . Current medications and supplements . Functional ability and status . Nutritional status . Physical activity . Advanced directives . List of other physicians . Hospitalizations, surgeries, and ER visits in previous 12 months . Vitals . Screenings to include cognitive, depression, and falls . Referrals and appointments  In addition, I have reviewed and discussed with patient certain preventive protocols, quality metrics, and best practice recommendations. A written personalized care plan for preventive services as well as general preventive health recommendations were provided to patient.     Chong Sicilian, RN   06/17/2018

## 2018-06-17 NOTE — Patient Instructions (Signed)
  [name] , Thank you for taking time to come for your Medicare Wellness Visit. I appreciate your ongoing commitment to your health goals. Please review the following plan we discussed and let me know if I can assist you in the future.   These are the goals we discussed: Goals    . Exercise 3x per week (30 min per time)     Walk for 30 minutes 3 times per week       This is a list of the screening recommended for you and due dates:  Health Maintenance  Topic Date Due  . Complete foot exam   09/17/2018*  . Flu Shot  07/04/2018  . Pneumonia vaccines (2 of 2 - PPSV23) 07/07/2018  . Hemoglobin A1C  07/28/2018  . Mammogram  11/16/2018  . Eye exam for diabetics  03/26/2019  . Tetanus Vaccine  08/23/2021  . Colon Cancer Screening  11/03/2024  . DEXA scan (bone density measurement)  Completed  .  Hepatitis C: One time screening is recommended by Center for Disease Control  (CDC) for  adults born from [birth year] through 1964.   Completed  . HIV Screening  Completed  *Topic was postponed. The date shown is not the original due date.

## 2018-06-18 DIAGNOSIS — Z992 Dependence on renal dialysis: Secondary | ICD-10-CM | POA: Diagnosis not present

## 2018-06-18 DIAGNOSIS — N186 End stage renal disease: Secondary | ICD-10-CM | POA: Diagnosis not present

## 2018-06-18 DIAGNOSIS — N2581 Secondary hyperparathyroidism of renal origin: Secondary | ICD-10-CM | POA: Diagnosis not present

## 2018-06-18 DIAGNOSIS — D509 Iron deficiency anemia, unspecified: Secondary | ICD-10-CM | POA: Diagnosis not present

## 2018-06-18 DIAGNOSIS — D631 Anemia in chronic kidney disease: Secondary | ICD-10-CM | POA: Diagnosis not present

## 2018-06-20 DIAGNOSIS — D631 Anemia in chronic kidney disease: Secondary | ICD-10-CM | POA: Diagnosis not present

## 2018-06-20 DIAGNOSIS — N186 End stage renal disease: Secondary | ICD-10-CM | POA: Diagnosis not present

## 2018-06-20 DIAGNOSIS — Z992 Dependence on renal dialysis: Secondary | ICD-10-CM | POA: Diagnosis not present

## 2018-06-20 DIAGNOSIS — N2581 Secondary hyperparathyroidism of renal origin: Secondary | ICD-10-CM | POA: Diagnosis not present

## 2018-06-20 DIAGNOSIS — D509 Iron deficiency anemia, unspecified: Secondary | ICD-10-CM | POA: Diagnosis not present

## 2018-06-22 DIAGNOSIS — Z992 Dependence on renal dialysis: Secondary | ICD-10-CM | POA: Diagnosis not present

## 2018-06-22 DIAGNOSIS — N186 End stage renal disease: Secondary | ICD-10-CM | POA: Diagnosis not present

## 2018-06-22 DIAGNOSIS — N2581 Secondary hyperparathyroidism of renal origin: Secondary | ICD-10-CM | POA: Diagnosis not present

## 2018-06-22 DIAGNOSIS — D509 Iron deficiency anemia, unspecified: Secondary | ICD-10-CM | POA: Diagnosis not present

## 2018-06-22 DIAGNOSIS — D631 Anemia in chronic kidney disease: Secondary | ICD-10-CM | POA: Diagnosis not present

## 2018-06-25 DIAGNOSIS — D509 Iron deficiency anemia, unspecified: Secondary | ICD-10-CM | POA: Diagnosis not present

## 2018-06-25 DIAGNOSIS — Z992 Dependence on renal dialysis: Secondary | ICD-10-CM | POA: Diagnosis not present

## 2018-06-25 DIAGNOSIS — N186 End stage renal disease: Secondary | ICD-10-CM | POA: Diagnosis not present

## 2018-06-25 DIAGNOSIS — D631 Anemia in chronic kidney disease: Secondary | ICD-10-CM | POA: Diagnosis not present

## 2018-06-25 DIAGNOSIS — N2581 Secondary hyperparathyroidism of renal origin: Secondary | ICD-10-CM | POA: Diagnosis not present

## 2018-06-27 DIAGNOSIS — N186 End stage renal disease: Secondary | ICD-10-CM | POA: Diagnosis not present

## 2018-06-27 DIAGNOSIS — D509 Iron deficiency anemia, unspecified: Secondary | ICD-10-CM | POA: Diagnosis not present

## 2018-06-27 DIAGNOSIS — Z992 Dependence on renal dialysis: Secondary | ICD-10-CM | POA: Diagnosis not present

## 2018-06-27 DIAGNOSIS — D631 Anemia in chronic kidney disease: Secondary | ICD-10-CM | POA: Diagnosis not present

## 2018-06-27 DIAGNOSIS — N2581 Secondary hyperparathyroidism of renal origin: Secondary | ICD-10-CM | POA: Diagnosis not present

## 2018-06-29 DIAGNOSIS — N186 End stage renal disease: Secondary | ICD-10-CM | POA: Diagnosis not present

## 2018-06-29 DIAGNOSIS — Z992 Dependence on renal dialysis: Secondary | ICD-10-CM | POA: Diagnosis not present

## 2018-06-29 DIAGNOSIS — D631 Anemia in chronic kidney disease: Secondary | ICD-10-CM | POA: Diagnosis not present

## 2018-06-29 DIAGNOSIS — N2581 Secondary hyperparathyroidism of renal origin: Secondary | ICD-10-CM | POA: Diagnosis not present

## 2018-06-29 DIAGNOSIS — D509 Iron deficiency anemia, unspecified: Secondary | ICD-10-CM | POA: Diagnosis not present

## 2018-07-02 DIAGNOSIS — D631 Anemia in chronic kidney disease: Secondary | ICD-10-CM | POA: Diagnosis not present

## 2018-07-02 DIAGNOSIS — Z992 Dependence on renal dialysis: Secondary | ICD-10-CM | POA: Diagnosis not present

## 2018-07-02 DIAGNOSIS — N186 End stage renal disease: Secondary | ICD-10-CM | POA: Diagnosis not present

## 2018-07-02 DIAGNOSIS — D509 Iron deficiency anemia, unspecified: Secondary | ICD-10-CM | POA: Diagnosis not present

## 2018-07-02 DIAGNOSIS — N2581 Secondary hyperparathyroidism of renal origin: Secondary | ICD-10-CM | POA: Diagnosis not present

## 2018-07-03 DIAGNOSIS — Z992 Dependence on renal dialysis: Secondary | ICD-10-CM | POA: Diagnosis not present

## 2018-07-03 DIAGNOSIS — N186 End stage renal disease: Secondary | ICD-10-CM | POA: Diagnosis not present

## 2018-07-04 DIAGNOSIS — D631 Anemia in chronic kidney disease: Secondary | ICD-10-CM | POA: Diagnosis not present

## 2018-07-04 DIAGNOSIS — N2581 Secondary hyperparathyroidism of renal origin: Secondary | ICD-10-CM | POA: Diagnosis not present

## 2018-07-04 DIAGNOSIS — Z992 Dependence on renal dialysis: Secondary | ICD-10-CM | POA: Diagnosis not present

## 2018-07-04 DIAGNOSIS — D509 Iron deficiency anemia, unspecified: Secondary | ICD-10-CM | POA: Diagnosis not present

## 2018-07-04 DIAGNOSIS — N186 End stage renal disease: Secondary | ICD-10-CM | POA: Diagnosis not present

## 2018-07-06 DIAGNOSIS — N186 End stage renal disease: Secondary | ICD-10-CM | POA: Diagnosis not present

## 2018-07-06 DIAGNOSIS — N2581 Secondary hyperparathyroidism of renal origin: Secondary | ICD-10-CM | POA: Diagnosis not present

## 2018-07-06 DIAGNOSIS — D631 Anemia in chronic kidney disease: Secondary | ICD-10-CM | POA: Diagnosis not present

## 2018-07-06 DIAGNOSIS — D509 Iron deficiency anemia, unspecified: Secondary | ICD-10-CM | POA: Diagnosis not present

## 2018-07-06 DIAGNOSIS — Z992 Dependence on renal dialysis: Secondary | ICD-10-CM | POA: Diagnosis not present

## 2018-07-09 DIAGNOSIS — N2581 Secondary hyperparathyroidism of renal origin: Secondary | ICD-10-CM | POA: Diagnosis not present

## 2018-07-09 DIAGNOSIS — N186 End stage renal disease: Secondary | ICD-10-CM | POA: Diagnosis not present

## 2018-07-09 DIAGNOSIS — D509 Iron deficiency anemia, unspecified: Secondary | ICD-10-CM | POA: Diagnosis not present

## 2018-07-09 DIAGNOSIS — Z992 Dependence on renal dialysis: Secondary | ICD-10-CM | POA: Diagnosis not present

## 2018-07-09 DIAGNOSIS — D631 Anemia in chronic kidney disease: Secondary | ICD-10-CM | POA: Diagnosis not present

## 2018-07-11 DIAGNOSIS — D509 Iron deficiency anemia, unspecified: Secondary | ICD-10-CM | POA: Diagnosis not present

## 2018-07-11 DIAGNOSIS — N186 End stage renal disease: Secondary | ICD-10-CM | POA: Diagnosis not present

## 2018-07-11 DIAGNOSIS — N2581 Secondary hyperparathyroidism of renal origin: Secondary | ICD-10-CM | POA: Diagnosis not present

## 2018-07-11 DIAGNOSIS — Z992 Dependence on renal dialysis: Secondary | ICD-10-CM | POA: Diagnosis not present

## 2018-07-11 DIAGNOSIS — D631 Anemia in chronic kidney disease: Secondary | ICD-10-CM | POA: Diagnosis not present

## 2018-07-13 DIAGNOSIS — N186 End stage renal disease: Secondary | ICD-10-CM | POA: Diagnosis not present

## 2018-07-13 DIAGNOSIS — D631 Anemia in chronic kidney disease: Secondary | ICD-10-CM | POA: Diagnosis not present

## 2018-07-13 DIAGNOSIS — Z992 Dependence on renal dialysis: Secondary | ICD-10-CM | POA: Diagnosis not present

## 2018-07-13 DIAGNOSIS — N2581 Secondary hyperparathyroidism of renal origin: Secondary | ICD-10-CM | POA: Diagnosis not present

## 2018-07-13 DIAGNOSIS — D509 Iron deficiency anemia, unspecified: Secondary | ICD-10-CM | POA: Diagnosis not present

## 2018-07-16 DIAGNOSIS — N186 End stage renal disease: Secondary | ICD-10-CM | POA: Diagnosis not present

## 2018-07-16 DIAGNOSIS — N2581 Secondary hyperparathyroidism of renal origin: Secondary | ICD-10-CM | POA: Diagnosis not present

## 2018-07-16 DIAGNOSIS — D631 Anemia in chronic kidney disease: Secondary | ICD-10-CM | POA: Diagnosis not present

## 2018-07-16 DIAGNOSIS — Z992 Dependence on renal dialysis: Secondary | ICD-10-CM | POA: Diagnosis not present

## 2018-07-16 DIAGNOSIS — D509 Iron deficiency anemia, unspecified: Secondary | ICD-10-CM | POA: Diagnosis not present

## 2018-07-18 DIAGNOSIS — Z992 Dependence on renal dialysis: Secondary | ICD-10-CM | POA: Diagnosis not present

## 2018-07-18 DIAGNOSIS — D631 Anemia in chronic kidney disease: Secondary | ICD-10-CM | POA: Diagnosis not present

## 2018-07-18 DIAGNOSIS — D509 Iron deficiency anemia, unspecified: Secondary | ICD-10-CM | POA: Diagnosis not present

## 2018-07-18 DIAGNOSIS — N2581 Secondary hyperparathyroidism of renal origin: Secondary | ICD-10-CM | POA: Diagnosis not present

## 2018-07-18 DIAGNOSIS — N186 End stage renal disease: Secondary | ICD-10-CM | POA: Diagnosis not present

## 2018-07-20 DIAGNOSIS — N186 End stage renal disease: Secondary | ICD-10-CM | POA: Diagnosis not present

## 2018-07-20 DIAGNOSIS — N2581 Secondary hyperparathyroidism of renal origin: Secondary | ICD-10-CM | POA: Diagnosis not present

## 2018-07-20 DIAGNOSIS — D631 Anemia in chronic kidney disease: Secondary | ICD-10-CM | POA: Diagnosis not present

## 2018-07-20 DIAGNOSIS — Z992 Dependence on renal dialysis: Secondary | ICD-10-CM | POA: Diagnosis not present

## 2018-07-20 DIAGNOSIS — D509 Iron deficiency anemia, unspecified: Secondary | ICD-10-CM | POA: Diagnosis not present

## 2018-07-21 ENCOUNTER — Other Ambulatory Visit: Payer: Self-pay | Admitting: Pediatrics

## 2018-07-21 DIAGNOSIS — I10 Essential (primary) hypertension: Secondary | ICD-10-CM

## 2018-07-23 DIAGNOSIS — N2581 Secondary hyperparathyroidism of renal origin: Secondary | ICD-10-CM | POA: Diagnosis not present

## 2018-07-23 DIAGNOSIS — D631 Anemia in chronic kidney disease: Secondary | ICD-10-CM | POA: Diagnosis not present

## 2018-07-23 DIAGNOSIS — D509 Iron deficiency anemia, unspecified: Secondary | ICD-10-CM | POA: Diagnosis not present

## 2018-07-23 DIAGNOSIS — N186 End stage renal disease: Secondary | ICD-10-CM | POA: Diagnosis not present

## 2018-07-23 DIAGNOSIS — Z992 Dependence on renal dialysis: Secondary | ICD-10-CM | POA: Diagnosis not present

## 2018-07-25 DIAGNOSIS — Z992 Dependence on renal dialysis: Secondary | ICD-10-CM | POA: Diagnosis not present

## 2018-07-25 DIAGNOSIS — N186 End stage renal disease: Secondary | ICD-10-CM | POA: Diagnosis not present

## 2018-07-25 DIAGNOSIS — D509 Iron deficiency anemia, unspecified: Secondary | ICD-10-CM | POA: Diagnosis not present

## 2018-07-25 DIAGNOSIS — N2581 Secondary hyperparathyroidism of renal origin: Secondary | ICD-10-CM | POA: Diagnosis not present

## 2018-07-25 DIAGNOSIS — D631 Anemia in chronic kidney disease: Secondary | ICD-10-CM | POA: Diagnosis not present

## 2018-07-27 DIAGNOSIS — D631 Anemia in chronic kidney disease: Secondary | ICD-10-CM | POA: Diagnosis not present

## 2018-07-27 DIAGNOSIS — D509 Iron deficiency anemia, unspecified: Secondary | ICD-10-CM | POA: Diagnosis not present

## 2018-07-27 DIAGNOSIS — N186 End stage renal disease: Secondary | ICD-10-CM | POA: Diagnosis not present

## 2018-07-27 DIAGNOSIS — N2581 Secondary hyperparathyroidism of renal origin: Secondary | ICD-10-CM | POA: Diagnosis not present

## 2018-07-27 DIAGNOSIS — Z992 Dependence on renal dialysis: Secondary | ICD-10-CM | POA: Diagnosis not present

## 2018-07-30 DIAGNOSIS — N186 End stage renal disease: Secondary | ICD-10-CM | POA: Diagnosis not present

## 2018-07-30 DIAGNOSIS — Z992 Dependence on renal dialysis: Secondary | ICD-10-CM | POA: Diagnosis not present

## 2018-07-30 DIAGNOSIS — D631 Anemia in chronic kidney disease: Secondary | ICD-10-CM | POA: Diagnosis not present

## 2018-07-30 DIAGNOSIS — D509 Iron deficiency anemia, unspecified: Secondary | ICD-10-CM | POA: Diagnosis not present

## 2018-07-30 DIAGNOSIS — N2581 Secondary hyperparathyroidism of renal origin: Secondary | ICD-10-CM | POA: Diagnosis not present

## 2018-08-01 DIAGNOSIS — N186 End stage renal disease: Secondary | ICD-10-CM | POA: Diagnosis not present

## 2018-08-01 DIAGNOSIS — D631 Anemia in chronic kidney disease: Secondary | ICD-10-CM | POA: Diagnosis not present

## 2018-08-01 DIAGNOSIS — Z992 Dependence on renal dialysis: Secondary | ICD-10-CM | POA: Diagnosis not present

## 2018-08-01 DIAGNOSIS — D509 Iron deficiency anemia, unspecified: Secondary | ICD-10-CM | POA: Diagnosis not present

## 2018-08-01 DIAGNOSIS — N2581 Secondary hyperparathyroidism of renal origin: Secondary | ICD-10-CM | POA: Diagnosis not present

## 2018-08-03 DIAGNOSIS — N186 End stage renal disease: Secondary | ICD-10-CM | POA: Diagnosis not present

## 2018-08-03 DIAGNOSIS — D509 Iron deficiency anemia, unspecified: Secondary | ICD-10-CM | POA: Diagnosis not present

## 2018-08-03 DIAGNOSIS — D631 Anemia in chronic kidney disease: Secondary | ICD-10-CM | POA: Diagnosis not present

## 2018-08-03 DIAGNOSIS — N2581 Secondary hyperparathyroidism of renal origin: Secondary | ICD-10-CM | POA: Diagnosis not present

## 2018-08-03 DIAGNOSIS — Z992 Dependence on renal dialysis: Secondary | ICD-10-CM | POA: Diagnosis not present

## 2018-08-06 DIAGNOSIS — N186 End stage renal disease: Secondary | ICD-10-CM | POA: Diagnosis not present

## 2018-08-06 DIAGNOSIS — Z992 Dependence on renal dialysis: Secondary | ICD-10-CM | POA: Diagnosis not present

## 2018-08-06 DIAGNOSIS — N2581 Secondary hyperparathyroidism of renal origin: Secondary | ICD-10-CM | POA: Diagnosis not present

## 2018-08-06 DIAGNOSIS — Z23 Encounter for immunization: Secondary | ICD-10-CM | POA: Diagnosis not present

## 2018-08-06 DIAGNOSIS — D509 Iron deficiency anemia, unspecified: Secondary | ICD-10-CM | POA: Diagnosis not present

## 2018-08-06 DIAGNOSIS — D631 Anemia in chronic kidney disease: Secondary | ICD-10-CM | POA: Diagnosis not present

## 2018-08-08 DIAGNOSIS — D509 Iron deficiency anemia, unspecified: Secondary | ICD-10-CM | POA: Diagnosis not present

## 2018-08-08 DIAGNOSIS — N186 End stage renal disease: Secondary | ICD-10-CM | POA: Diagnosis not present

## 2018-08-08 DIAGNOSIS — N2581 Secondary hyperparathyroidism of renal origin: Secondary | ICD-10-CM | POA: Diagnosis not present

## 2018-08-08 DIAGNOSIS — Z992 Dependence on renal dialysis: Secondary | ICD-10-CM | POA: Diagnosis not present

## 2018-08-08 DIAGNOSIS — Z23 Encounter for immunization: Secondary | ICD-10-CM | POA: Diagnosis not present

## 2018-08-08 DIAGNOSIS — D631 Anemia in chronic kidney disease: Secondary | ICD-10-CM | POA: Diagnosis not present

## 2018-08-10 DIAGNOSIS — Z992 Dependence on renal dialysis: Secondary | ICD-10-CM | POA: Diagnosis not present

## 2018-08-10 DIAGNOSIS — N2581 Secondary hyperparathyroidism of renal origin: Secondary | ICD-10-CM | POA: Diagnosis not present

## 2018-08-10 DIAGNOSIS — Z23 Encounter for immunization: Secondary | ICD-10-CM | POA: Diagnosis not present

## 2018-08-10 DIAGNOSIS — D631 Anemia in chronic kidney disease: Secondary | ICD-10-CM | POA: Diagnosis not present

## 2018-08-10 DIAGNOSIS — N186 End stage renal disease: Secondary | ICD-10-CM | POA: Diagnosis not present

## 2018-08-10 DIAGNOSIS — D509 Iron deficiency anemia, unspecified: Secondary | ICD-10-CM | POA: Diagnosis not present

## 2018-08-13 DIAGNOSIS — Z992 Dependence on renal dialysis: Secondary | ICD-10-CM | POA: Diagnosis not present

## 2018-08-13 DIAGNOSIS — N2581 Secondary hyperparathyroidism of renal origin: Secondary | ICD-10-CM | POA: Diagnosis not present

## 2018-08-13 DIAGNOSIS — D509 Iron deficiency anemia, unspecified: Secondary | ICD-10-CM | POA: Diagnosis not present

## 2018-08-13 DIAGNOSIS — N186 End stage renal disease: Secondary | ICD-10-CM | POA: Diagnosis not present

## 2018-08-13 DIAGNOSIS — Z23 Encounter for immunization: Secondary | ICD-10-CM | POA: Diagnosis not present

## 2018-08-13 DIAGNOSIS — D631 Anemia in chronic kidney disease: Secondary | ICD-10-CM | POA: Diagnosis not present

## 2018-08-15 DIAGNOSIS — D631 Anemia in chronic kidney disease: Secondary | ICD-10-CM | POA: Diagnosis not present

## 2018-08-15 DIAGNOSIS — D509 Iron deficiency anemia, unspecified: Secondary | ICD-10-CM | POA: Diagnosis not present

## 2018-08-15 DIAGNOSIS — Z992 Dependence on renal dialysis: Secondary | ICD-10-CM | POA: Diagnosis not present

## 2018-08-15 DIAGNOSIS — N2581 Secondary hyperparathyroidism of renal origin: Secondary | ICD-10-CM | POA: Diagnosis not present

## 2018-08-15 DIAGNOSIS — N186 End stage renal disease: Secondary | ICD-10-CM | POA: Diagnosis not present

## 2018-08-15 DIAGNOSIS — Z23 Encounter for immunization: Secondary | ICD-10-CM | POA: Diagnosis not present

## 2018-08-17 DIAGNOSIS — Z992 Dependence on renal dialysis: Secondary | ICD-10-CM | POA: Diagnosis not present

## 2018-08-17 DIAGNOSIS — N186 End stage renal disease: Secondary | ICD-10-CM | POA: Diagnosis not present

## 2018-08-17 DIAGNOSIS — D631 Anemia in chronic kidney disease: Secondary | ICD-10-CM | POA: Diagnosis not present

## 2018-08-17 DIAGNOSIS — D509 Iron deficiency anemia, unspecified: Secondary | ICD-10-CM | POA: Diagnosis not present

## 2018-08-17 DIAGNOSIS — N2581 Secondary hyperparathyroidism of renal origin: Secondary | ICD-10-CM | POA: Diagnosis not present

## 2018-08-17 DIAGNOSIS — Z23 Encounter for immunization: Secondary | ICD-10-CM | POA: Diagnosis not present

## 2018-08-20 ENCOUNTER — Encounter: Payer: Self-pay | Admitting: *Deleted

## 2018-08-20 DIAGNOSIS — D631 Anemia in chronic kidney disease: Secondary | ICD-10-CM | POA: Diagnosis not present

## 2018-08-20 DIAGNOSIS — Z23 Encounter for immunization: Secondary | ICD-10-CM | POA: Diagnosis not present

## 2018-08-20 DIAGNOSIS — Z992 Dependence on renal dialysis: Secondary | ICD-10-CM | POA: Diagnosis not present

## 2018-08-20 DIAGNOSIS — N186 End stage renal disease: Secondary | ICD-10-CM | POA: Diagnosis not present

## 2018-08-20 DIAGNOSIS — D509 Iron deficiency anemia, unspecified: Secondary | ICD-10-CM | POA: Diagnosis not present

## 2018-08-20 DIAGNOSIS — N2581 Secondary hyperparathyroidism of renal origin: Secondary | ICD-10-CM | POA: Diagnosis not present

## 2018-08-22 DIAGNOSIS — Z992 Dependence on renal dialysis: Secondary | ICD-10-CM | POA: Diagnosis not present

## 2018-08-22 DIAGNOSIS — N2581 Secondary hyperparathyroidism of renal origin: Secondary | ICD-10-CM | POA: Diagnosis not present

## 2018-08-22 DIAGNOSIS — D631 Anemia in chronic kidney disease: Secondary | ICD-10-CM | POA: Diagnosis not present

## 2018-08-22 DIAGNOSIS — N186 End stage renal disease: Secondary | ICD-10-CM | POA: Diagnosis not present

## 2018-08-22 DIAGNOSIS — Z23 Encounter for immunization: Secondary | ICD-10-CM | POA: Diagnosis not present

## 2018-08-22 DIAGNOSIS — D509 Iron deficiency anemia, unspecified: Secondary | ICD-10-CM | POA: Diagnosis not present

## 2018-08-24 DIAGNOSIS — N186 End stage renal disease: Secondary | ICD-10-CM | POA: Diagnosis not present

## 2018-08-24 DIAGNOSIS — N2581 Secondary hyperparathyroidism of renal origin: Secondary | ICD-10-CM | POA: Diagnosis not present

## 2018-08-24 DIAGNOSIS — Z23 Encounter for immunization: Secondary | ICD-10-CM | POA: Diagnosis not present

## 2018-08-24 DIAGNOSIS — Z992 Dependence on renal dialysis: Secondary | ICD-10-CM | POA: Diagnosis not present

## 2018-08-24 DIAGNOSIS — D631 Anemia in chronic kidney disease: Secondary | ICD-10-CM | POA: Diagnosis not present

## 2018-08-24 DIAGNOSIS — D509 Iron deficiency anemia, unspecified: Secondary | ICD-10-CM | POA: Diagnosis not present

## 2018-08-27 DIAGNOSIS — Z992 Dependence on renal dialysis: Secondary | ICD-10-CM | POA: Diagnosis not present

## 2018-08-27 DIAGNOSIS — D509 Iron deficiency anemia, unspecified: Secondary | ICD-10-CM | POA: Diagnosis not present

## 2018-08-27 DIAGNOSIS — N2581 Secondary hyperparathyroidism of renal origin: Secondary | ICD-10-CM | POA: Diagnosis not present

## 2018-08-27 DIAGNOSIS — N186 End stage renal disease: Secondary | ICD-10-CM | POA: Diagnosis not present

## 2018-08-27 DIAGNOSIS — D631 Anemia in chronic kidney disease: Secondary | ICD-10-CM | POA: Diagnosis not present

## 2018-08-27 DIAGNOSIS — Z23 Encounter for immunization: Secondary | ICD-10-CM | POA: Diagnosis not present

## 2018-08-27 DIAGNOSIS — I259 Chronic ischemic heart disease, unspecified: Secondary | ICD-10-CM | POA: Diagnosis not present

## 2018-08-27 DIAGNOSIS — E119 Type 2 diabetes mellitus without complications: Secondary | ICD-10-CM | POA: Diagnosis not present

## 2018-08-27 DIAGNOSIS — Z794 Long term (current) use of insulin: Secondary | ICD-10-CM | POA: Diagnosis not present

## 2018-08-29 DIAGNOSIS — Z23 Encounter for immunization: Secondary | ICD-10-CM | POA: Diagnosis not present

## 2018-08-29 DIAGNOSIS — N2581 Secondary hyperparathyroidism of renal origin: Secondary | ICD-10-CM | POA: Diagnosis not present

## 2018-08-29 DIAGNOSIS — D509 Iron deficiency anemia, unspecified: Secondary | ICD-10-CM | POA: Diagnosis not present

## 2018-08-29 DIAGNOSIS — N186 End stage renal disease: Secondary | ICD-10-CM | POA: Diagnosis not present

## 2018-08-29 DIAGNOSIS — Z992 Dependence on renal dialysis: Secondary | ICD-10-CM | POA: Diagnosis not present

## 2018-08-29 DIAGNOSIS — D631 Anemia in chronic kidney disease: Secondary | ICD-10-CM | POA: Diagnosis not present

## 2018-08-31 DIAGNOSIS — D509 Iron deficiency anemia, unspecified: Secondary | ICD-10-CM | POA: Diagnosis not present

## 2018-08-31 DIAGNOSIS — N186 End stage renal disease: Secondary | ICD-10-CM | POA: Diagnosis not present

## 2018-08-31 DIAGNOSIS — Z992 Dependence on renal dialysis: Secondary | ICD-10-CM | POA: Diagnosis not present

## 2018-08-31 DIAGNOSIS — D631 Anemia in chronic kidney disease: Secondary | ICD-10-CM | POA: Diagnosis not present

## 2018-08-31 DIAGNOSIS — Z23 Encounter for immunization: Secondary | ICD-10-CM | POA: Diagnosis not present

## 2018-08-31 DIAGNOSIS — N2581 Secondary hyperparathyroidism of renal origin: Secondary | ICD-10-CM | POA: Diagnosis not present

## 2018-09-02 DIAGNOSIS — Z992 Dependence on renal dialysis: Secondary | ICD-10-CM | POA: Diagnosis not present

## 2018-09-02 DIAGNOSIS — N186 End stage renal disease: Secondary | ICD-10-CM | POA: Diagnosis not present

## 2018-09-03 DIAGNOSIS — D509 Iron deficiency anemia, unspecified: Secondary | ICD-10-CM | POA: Diagnosis not present

## 2018-09-03 DIAGNOSIS — D631 Anemia in chronic kidney disease: Secondary | ICD-10-CM | POA: Diagnosis not present

## 2018-09-03 DIAGNOSIS — Z992 Dependence on renal dialysis: Secondary | ICD-10-CM | POA: Diagnosis not present

## 2018-09-03 DIAGNOSIS — N2581 Secondary hyperparathyroidism of renal origin: Secondary | ICD-10-CM | POA: Diagnosis not present

## 2018-09-03 DIAGNOSIS — N186 End stage renal disease: Secondary | ICD-10-CM | POA: Diagnosis not present

## 2018-09-05 DIAGNOSIS — Z992 Dependence on renal dialysis: Secondary | ICD-10-CM | POA: Diagnosis not present

## 2018-09-05 DIAGNOSIS — N186 End stage renal disease: Secondary | ICD-10-CM | POA: Diagnosis not present

## 2018-09-05 DIAGNOSIS — D631 Anemia in chronic kidney disease: Secondary | ICD-10-CM | POA: Diagnosis not present

## 2018-09-05 DIAGNOSIS — N2581 Secondary hyperparathyroidism of renal origin: Secondary | ICD-10-CM | POA: Diagnosis not present

## 2018-09-05 DIAGNOSIS — D509 Iron deficiency anemia, unspecified: Secondary | ICD-10-CM | POA: Diagnosis not present

## 2018-09-07 DIAGNOSIS — D509 Iron deficiency anemia, unspecified: Secondary | ICD-10-CM | POA: Diagnosis not present

## 2018-09-07 DIAGNOSIS — N2581 Secondary hyperparathyroidism of renal origin: Secondary | ICD-10-CM | POA: Diagnosis not present

## 2018-09-07 DIAGNOSIS — Z992 Dependence on renal dialysis: Secondary | ICD-10-CM | POA: Diagnosis not present

## 2018-09-07 DIAGNOSIS — N186 End stage renal disease: Secondary | ICD-10-CM | POA: Diagnosis not present

## 2018-09-07 DIAGNOSIS — D631 Anemia in chronic kidney disease: Secondary | ICD-10-CM | POA: Diagnosis not present

## 2018-09-09 ENCOUNTER — Ambulatory Visit: Payer: Medicare Other | Admitting: Pediatrics

## 2018-09-10 DIAGNOSIS — D509 Iron deficiency anemia, unspecified: Secondary | ICD-10-CM | POA: Diagnosis not present

## 2018-09-10 DIAGNOSIS — D631 Anemia in chronic kidney disease: Secondary | ICD-10-CM | POA: Diagnosis not present

## 2018-09-10 DIAGNOSIS — Z992 Dependence on renal dialysis: Secondary | ICD-10-CM | POA: Diagnosis not present

## 2018-09-10 DIAGNOSIS — N2581 Secondary hyperparathyroidism of renal origin: Secondary | ICD-10-CM | POA: Diagnosis not present

## 2018-09-10 DIAGNOSIS — N186 End stage renal disease: Secondary | ICD-10-CM | POA: Diagnosis not present

## 2018-09-11 ENCOUNTER — Encounter: Payer: Self-pay | Admitting: Pediatrics

## 2018-09-11 ENCOUNTER — Ambulatory Visit (INDEPENDENT_AMBULATORY_CARE_PROVIDER_SITE_OTHER): Payer: Medicare Other | Admitting: Pediatrics

## 2018-09-11 VITALS — BP 129/66 | HR 66 | Temp 97.1°F | Ht 63.0 in | Wt 174.8 lb

## 2018-09-11 DIAGNOSIS — I10 Essential (primary) hypertension: Secondary | ICD-10-CM | POA: Diagnosis not present

## 2018-09-11 DIAGNOSIS — E785 Hyperlipidemia, unspecified: Secondary | ICD-10-CM

## 2018-09-11 DIAGNOSIS — E1121 Type 2 diabetes mellitus with diabetic nephropathy: Secondary | ICD-10-CM | POA: Diagnosis not present

## 2018-09-11 DIAGNOSIS — I6523 Occlusion and stenosis of bilateral carotid arteries: Secondary | ICD-10-CM

## 2018-09-11 DIAGNOSIS — F419 Anxiety disorder, unspecified: Secondary | ICD-10-CM

## 2018-09-11 MED ORDER — INSULIN GLARGINE 100 UNIT/ML SOLOSTAR PEN: PEN_INJECTOR | SUBCUTANEOUS | 2 refills | Status: DC

## 2018-09-11 MED ORDER — LORAZEPAM 0.5 MG PO TABS: 0.5000 mg | ORAL_TABLET | Freq: Three times a day (TID) | ORAL | 1 refills | Status: DC | PRN

## 2018-09-11 NOTE — Progress Notes (Signed)
  Subjective:   Patient ID: [name], female    DOB: 07-30-1951, 67 y.o.   MRN: [id number] CC: Medical Management of Chronic Issues  HPI: [name] is a 67 y.o. female   ESRD: still sometimes with cramping at end of dialysis. First 3 hours of dialysis she does fine, last hour sometimes take ativan because of cramps. Still making urine.   HLD: Tolerating statin, taking regularly.  DM2: not checking BGL regularly.  Taking 15 units insulin long-acting at night.  She has not had any lows or highs recently that she knows of.  Anxiety: Taking Lexapro regularly.  She does think it helps with her mood.  Abdominal pain has resolved.  Relevant past medical, surgical, family and social history reviewed. Allergies and medications reviewed and updated. Social History   Tobacco Use  Smoking Status Former Smoker  . Packs/day: 1.00  . Years: 30.00  . Pack years: 30.00  . Last attempt to quit: 12/04/2001  . Years since quitting: 16.7  Smokeless Tobacco Never Used   ROS: Per HPI   Objective:    BP 129/66   Pulse 66   Temp (!) 97.1 F (36.2 C) (Oral)   Ht 5\' 3"  (1.6 m)   Wt 174 lb 12.8 oz (79.3 kg)   BMI 30.96 kg/m   Wt Readings from Last 3 Encounters:  09/11/18 174 lb 12.8 oz (79.3 kg)  06/17/18 181 lb (82.1 kg)  05/01/18 178 lb (80.7 kg)    Gen: NAD, alert, cooperative with exam, NCAT EYES: EOMI, no conjunctival injection, or no icterus ENT: OP without erythema LYMPH: no cervical LAD CV: NRRR, normal S1/S2, no murmur, distal pulses 2+ b/l Resp: CTABL, no wheezes, normal WOB Abd: +BS, soft, NTND.  Ext: No edema, warm Neuro: Alert and oriented, strength equal b/l UE and LE, coordination grossly normal MSK: normal muscle bulk  Assessment & Plan:  [name] was seen today for medical management of chronic issues.  Diagnoses and all orders for this visit:  Hyperlipidemia, unspecified hyperlipidemia type Stable, continue current medicines.  Brought lab work with her,  will scan in.  LDL at goal.  Essential (primary) hypertension Adequate control.  Continue current medicines.  Type II diabetes mellitus with nephropathy (HCC) Stable, continue Lantus.  Check blood sugars in the morning. -     Insulin Glargine (LANTUS SOLOSTAR) 100 UNIT/ML Solostar Pen; INJECT 15 TO 25 UNITS INTO THE SKIN DAILY AT 10 PM.  Anxiety Continue Lexapro.  Rarely needing Ativan, not taking every day.  Continue as needed.  Eating more regularly than usual let me know. -     LORazepam (ATIVAN) 0.5 MG tablet; Take 1 tablet (0.5 mg total) by mouth every 8 (eight) hours as needed for anxiety.  Follow up plan: Return in about 3 months (around 12/12/2018). [name], MD [place]

## 2018-09-12 DIAGNOSIS — N2581 Secondary hyperparathyroidism of renal origin: Secondary | ICD-10-CM | POA: Diagnosis not present

## 2018-09-12 DIAGNOSIS — D509 Iron deficiency anemia, unspecified: Secondary | ICD-10-CM | POA: Diagnosis not present

## 2018-09-12 DIAGNOSIS — Z992 Dependence on renal dialysis: Secondary | ICD-10-CM | POA: Diagnosis not present

## 2018-09-12 DIAGNOSIS — N186 End stage renal disease: Secondary | ICD-10-CM | POA: Diagnosis not present

## 2018-09-12 DIAGNOSIS — D631 Anemia in chronic kidney disease: Secondary | ICD-10-CM | POA: Diagnosis not present

## 2018-09-14 DIAGNOSIS — N186 End stage renal disease: Secondary | ICD-10-CM | POA: Diagnosis not present

## 2018-09-14 DIAGNOSIS — N2581 Secondary hyperparathyroidism of renal origin: Secondary | ICD-10-CM | POA: Diagnosis not present

## 2018-09-14 DIAGNOSIS — D509 Iron deficiency anemia, unspecified: Secondary | ICD-10-CM | POA: Diagnosis not present

## 2018-09-14 DIAGNOSIS — Z992 Dependence on renal dialysis: Secondary | ICD-10-CM | POA: Diagnosis not present

## 2018-09-14 DIAGNOSIS — D631 Anemia in chronic kidney disease: Secondary | ICD-10-CM | POA: Diagnosis not present

## 2018-09-17 DIAGNOSIS — Z992 Dependence on renal dialysis: Secondary | ICD-10-CM | POA: Diagnosis not present

## 2018-09-17 DIAGNOSIS — N186 End stage renal disease: Secondary | ICD-10-CM | POA: Diagnosis not present

## 2018-09-17 DIAGNOSIS — D631 Anemia in chronic kidney disease: Secondary | ICD-10-CM | POA: Diagnosis not present

## 2018-09-17 DIAGNOSIS — N2581 Secondary hyperparathyroidism of renal origin: Secondary | ICD-10-CM | POA: Diagnosis not present

## 2018-09-17 DIAGNOSIS — D509 Iron deficiency anemia, unspecified: Secondary | ICD-10-CM | POA: Diagnosis not present

## 2018-09-19 DIAGNOSIS — D509 Iron deficiency anemia, unspecified: Secondary | ICD-10-CM | POA: Diagnosis not present

## 2018-09-19 DIAGNOSIS — N186 End stage renal disease: Secondary | ICD-10-CM | POA: Diagnosis not present

## 2018-09-19 DIAGNOSIS — Z992 Dependence on renal dialysis: Secondary | ICD-10-CM | POA: Diagnosis not present

## 2018-09-19 DIAGNOSIS — D631 Anemia in chronic kidney disease: Secondary | ICD-10-CM | POA: Diagnosis not present

## 2018-09-19 DIAGNOSIS — N2581 Secondary hyperparathyroidism of renal origin: Secondary | ICD-10-CM | POA: Diagnosis not present

## 2018-09-21 DIAGNOSIS — D509 Iron deficiency anemia, unspecified: Secondary | ICD-10-CM | POA: Diagnosis not present

## 2018-09-21 DIAGNOSIS — N186 End stage renal disease: Secondary | ICD-10-CM | POA: Diagnosis not present

## 2018-09-21 DIAGNOSIS — N2581 Secondary hyperparathyroidism of renal origin: Secondary | ICD-10-CM | POA: Diagnosis not present

## 2018-09-21 DIAGNOSIS — Z992 Dependence on renal dialysis: Secondary | ICD-10-CM | POA: Diagnosis not present

## 2018-09-21 DIAGNOSIS — D631 Anemia in chronic kidney disease: Secondary | ICD-10-CM | POA: Diagnosis not present

## 2018-09-24 DIAGNOSIS — Z992 Dependence on renal dialysis: Secondary | ICD-10-CM | POA: Diagnosis not present

## 2018-09-24 DIAGNOSIS — N2581 Secondary hyperparathyroidism of renal origin: Secondary | ICD-10-CM | POA: Diagnosis not present

## 2018-09-24 DIAGNOSIS — N186 End stage renal disease: Secondary | ICD-10-CM | POA: Diagnosis not present

## 2018-09-24 DIAGNOSIS — D509 Iron deficiency anemia, unspecified: Secondary | ICD-10-CM | POA: Diagnosis not present

## 2018-09-24 DIAGNOSIS — D631 Anemia in chronic kidney disease: Secondary | ICD-10-CM | POA: Diagnosis not present

## 2018-09-26 DIAGNOSIS — N2581 Secondary hyperparathyroidism of renal origin: Secondary | ICD-10-CM | POA: Diagnosis not present

## 2018-09-26 DIAGNOSIS — Z992 Dependence on renal dialysis: Secondary | ICD-10-CM | POA: Diagnosis not present

## 2018-09-26 DIAGNOSIS — N186 End stage renal disease: Secondary | ICD-10-CM | POA: Diagnosis not present

## 2018-09-26 DIAGNOSIS — D509 Iron deficiency anemia, unspecified: Secondary | ICD-10-CM | POA: Diagnosis not present

## 2018-09-26 DIAGNOSIS — D631 Anemia in chronic kidney disease: Secondary | ICD-10-CM | POA: Diagnosis not present

## 2018-09-27 DIAGNOSIS — H40013 Open angle with borderline findings, low risk, bilateral: Secondary | ICD-10-CM | POA: Diagnosis not present

## 2018-09-28 DIAGNOSIS — N2581 Secondary hyperparathyroidism of renal origin: Secondary | ICD-10-CM | POA: Diagnosis not present

## 2018-09-28 DIAGNOSIS — D509 Iron deficiency anemia, unspecified: Secondary | ICD-10-CM | POA: Diagnosis not present

## 2018-09-28 DIAGNOSIS — N186 End stage renal disease: Secondary | ICD-10-CM | POA: Diagnosis not present

## 2018-09-28 DIAGNOSIS — Z992 Dependence on renal dialysis: Secondary | ICD-10-CM | POA: Diagnosis not present

## 2018-09-28 DIAGNOSIS — D631 Anemia in chronic kidney disease: Secondary | ICD-10-CM | POA: Diagnosis not present

## 2018-10-01 DIAGNOSIS — D631 Anemia in chronic kidney disease: Secondary | ICD-10-CM | POA: Diagnosis not present

## 2018-10-01 DIAGNOSIS — Z992 Dependence on renal dialysis: Secondary | ICD-10-CM | POA: Diagnosis not present

## 2018-10-01 DIAGNOSIS — D509 Iron deficiency anemia, unspecified: Secondary | ICD-10-CM | POA: Diagnosis not present

## 2018-10-01 DIAGNOSIS — N186 End stage renal disease: Secondary | ICD-10-CM | POA: Diagnosis not present

## 2018-10-01 DIAGNOSIS — N2581 Secondary hyperparathyroidism of renal origin: Secondary | ICD-10-CM | POA: Diagnosis not present

## 2018-10-03 DIAGNOSIS — D631 Anemia in chronic kidney disease: Secondary | ICD-10-CM | POA: Diagnosis not present

## 2018-10-03 DIAGNOSIS — N186 End stage renal disease: Secondary | ICD-10-CM | POA: Diagnosis not present

## 2018-10-03 DIAGNOSIS — D509 Iron deficiency anemia, unspecified: Secondary | ICD-10-CM | POA: Diagnosis not present

## 2018-10-03 DIAGNOSIS — Z992 Dependence on renal dialysis: Secondary | ICD-10-CM | POA: Diagnosis not present

## 2018-10-03 DIAGNOSIS — N2581 Secondary hyperparathyroidism of renal origin: Secondary | ICD-10-CM | POA: Diagnosis not present

## 2018-10-05 DIAGNOSIS — D509 Iron deficiency anemia, unspecified: Secondary | ICD-10-CM | POA: Diagnosis not present

## 2018-10-05 DIAGNOSIS — N186 End stage renal disease: Secondary | ICD-10-CM | POA: Diagnosis not present

## 2018-10-05 DIAGNOSIS — D631 Anemia in chronic kidney disease: Secondary | ICD-10-CM | POA: Diagnosis not present

## 2018-10-05 DIAGNOSIS — N2581 Secondary hyperparathyroidism of renal origin: Secondary | ICD-10-CM | POA: Diagnosis not present

## 2018-10-05 DIAGNOSIS — Z992 Dependence on renal dialysis: Secondary | ICD-10-CM | POA: Diagnosis not present

## 2018-10-08 DIAGNOSIS — N2581 Secondary hyperparathyroidism of renal origin: Secondary | ICD-10-CM | POA: Diagnosis not present

## 2018-10-08 DIAGNOSIS — N186 End stage renal disease: Secondary | ICD-10-CM | POA: Diagnosis not present

## 2018-10-08 DIAGNOSIS — D631 Anemia in chronic kidney disease: Secondary | ICD-10-CM | POA: Diagnosis not present

## 2018-10-08 DIAGNOSIS — Z992 Dependence on renal dialysis: Secondary | ICD-10-CM | POA: Diagnosis not present

## 2018-10-08 DIAGNOSIS — D509 Iron deficiency anemia, unspecified: Secondary | ICD-10-CM | POA: Diagnosis not present

## 2018-10-10 DIAGNOSIS — D509 Iron deficiency anemia, unspecified: Secondary | ICD-10-CM | POA: Diagnosis not present

## 2018-10-10 DIAGNOSIS — N2581 Secondary hyperparathyroidism of renal origin: Secondary | ICD-10-CM | POA: Diagnosis not present

## 2018-10-10 DIAGNOSIS — D631 Anemia in chronic kidney disease: Secondary | ICD-10-CM | POA: Diagnosis not present

## 2018-10-10 DIAGNOSIS — N186 End stage renal disease: Secondary | ICD-10-CM | POA: Diagnosis not present

## 2018-10-10 DIAGNOSIS — Z992 Dependence on renal dialysis: Secondary | ICD-10-CM | POA: Diagnosis not present

## 2018-10-12 DIAGNOSIS — D631 Anemia in chronic kidney disease: Secondary | ICD-10-CM | POA: Diagnosis not present

## 2018-10-12 DIAGNOSIS — Z992 Dependence on renal dialysis: Secondary | ICD-10-CM | POA: Diagnosis not present

## 2018-10-12 DIAGNOSIS — N2581 Secondary hyperparathyroidism of renal origin: Secondary | ICD-10-CM | POA: Diagnosis not present

## 2018-10-12 DIAGNOSIS — D509 Iron deficiency anemia, unspecified: Secondary | ICD-10-CM | POA: Diagnosis not present

## 2018-10-12 DIAGNOSIS — N186 End stage renal disease: Secondary | ICD-10-CM | POA: Diagnosis not present

## 2018-10-15 DIAGNOSIS — D631 Anemia in chronic kidney disease: Secondary | ICD-10-CM | POA: Diagnosis not present

## 2018-10-15 DIAGNOSIS — N2581 Secondary hyperparathyroidism of renal origin: Secondary | ICD-10-CM | POA: Diagnosis not present

## 2018-10-15 DIAGNOSIS — D509 Iron deficiency anemia, unspecified: Secondary | ICD-10-CM | POA: Diagnosis not present

## 2018-10-15 DIAGNOSIS — N186 End stage renal disease: Secondary | ICD-10-CM | POA: Diagnosis not present

## 2018-10-15 DIAGNOSIS — Z992 Dependence on renal dialysis: Secondary | ICD-10-CM | POA: Diagnosis not present

## 2018-10-17 DIAGNOSIS — D509 Iron deficiency anemia, unspecified: Secondary | ICD-10-CM | POA: Diagnosis not present

## 2018-10-17 DIAGNOSIS — N186 End stage renal disease: Secondary | ICD-10-CM | POA: Diagnosis not present

## 2018-10-17 DIAGNOSIS — D631 Anemia in chronic kidney disease: Secondary | ICD-10-CM | POA: Diagnosis not present

## 2018-10-17 DIAGNOSIS — N2581 Secondary hyperparathyroidism of renal origin: Secondary | ICD-10-CM | POA: Diagnosis not present

## 2018-10-17 DIAGNOSIS — Z992 Dependence on renal dialysis: Secondary | ICD-10-CM | POA: Diagnosis not present

## 2018-10-19 DIAGNOSIS — D509 Iron deficiency anemia, unspecified: Secondary | ICD-10-CM | POA: Diagnosis not present

## 2018-10-19 DIAGNOSIS — N2581 Secondary hyperparathyroidism of renal origin: Secondary | ICD-10-CM | POA: Diagnosis not present

## 2018-10-19 DIAGNOSIS — N186 End stage renal disease: Secondary | ICD-10-CM | POA: Diagnosis not present

## 2018-10-19 DIAGNOSIS — D631 Anemia in chronic kidney disease: Secondary | ICD-10-CM | POA: Diagnosis not present

## 2018-10-19 DIAGNOSIS — Z992 Dependence on renal dialysis: Secondary | ICD-10-CM | POA: Diagnosis not present

## 2018-10-22 ENCOUNTER — Encounter: Payer: Self-pay | Admitting: Pediatrics

## 2018-10-22 ENCOUNTER — Ambulatory Visit (INDEPENDENT_AMBULATORY_CARE_PROVIDER_SITE_OTHER): Payer: Medicare Other | Admitting: Pediatrics

## 2018-10-22 VITALS — BP 165/71 | HR 72 | Temp 97.4°F | Wt 176.8 lb

## 2018-10-22 DIAGNOSIS — I6523 Occlusion and stenosis of bilateral carotid arteries: Secondary | ICD-10-CM

## 2018-10-22 DIAGNOSIS — N186 End stage renal disease: Secondary | ICD-10-CM | POA: Diagnosis not present

## 2018-10-22 DIAGNOSIS — N2581 Secondary hyperparathyroidism of renal origin: Secondary | ICD-10-CM | POA: Diagnosis not present

## 2018-10-22 DIAGNOSIS — J0100 Acute maxillary sinusitis, unspecified: Secondary | ICD-10-CM | POA: Diagnosis not present

## 2018-10-22 DIAGNOSIS — I10 Essential (primary) hypertension: Secondary | ICD-10-CM

## 2018-10-22 DIAGNOSIS — R030 Elevated blood-pressure reading, without diagnosis of hypertension: Secondary | ICD-10-CM

## 2018-10-22 DIAGNOSIS — D509 Iron deficiency anemia, unspecified: Secondary | ICD-10-CM | POA: Diagnosis not present

## 2018-10-22 DIAGNOSIS — D631 Anemia in chronic kidney disease: Secondary | ICD-10-CM | POA: Diagnosis not present

## 2018-10-22 DIAGNOSIS — Z992 Dependence on renal dialysis: Secondary | ICD-10-CM | POA: Diagnosis not present

## 2018-10-22 MED ORDER — DOXYCYCLINE HYCLATE 100 MG PO TABS: 100.0000 mg | ORAL_TABLET | Freq: Two times a day (BID) | ORAL | 0 refills | Status: DC

## 2018-10-22 NOTE — Progress Notes (Signed)
  Subjective:   Patient ID: [name], female    DOB: Aug 11, 1951, 67 y.o.   MRN: [id number] CC: URI (head congestion, slight drainage, ear pain, cough, denies fever stared > 1 1/2 wks, thick dark brown mucus)  HPI: [name] is a 67 y.o. female   Right maxillary sinus/face feels most congested, both of her ears been bothering her.  Appetite slightly down.  Has been feeling worse over the last couple days.  Coughing some, dry and nonproductive.  Does not tolerate Nettie pot.  Has been using Flonase some.  Taking Tylenol.  End-stage renal disease and hypertension: Had dialysis this morning.  Usually takes her morning blood pressure medicines after dialysis.  Forgot today.  No headaches, lightheadedness or chest pain.  Relevant past medical, surgical, family and social history reviewed. Allergies and medications reviewed and updated. Social History   Tobacco Use  Smoking Status Former Smoker  . Packs/day: 1.00  . Years: 30.00  . Pack years: 30.00  . Last attempt to quit: 12/04/2001  . Years since quitting: 16.8  Smokeless Tobacco Never Used   ROS: Per HPI   Objective:    BP (!) 165/71   Pulse 72   Temp (!) 97.4 F (36.3 C) (Oral)   Wt 176 lb 12.8 oz (80.2 kg)   BMI 31.32 kg/m   Wt Readings from Last 3 Encounters:  10/22/18 176 lb 12.8 oz (80.2 kg)  09/11/18 174 lb 12.8 oz (79.3 kg)  06/17/18 181 lb (82.1 kg)    Gen: NAD, alert, cooperative with exam, NCAT EYES: EOMI, no conjunctival injection, or no icterus ENT:  TMs pearly pink bilaterally, layering yellow-white fluid left TM, OP without erythema LYMPH: no cervical LAD CV: NRRR, normal S1/S2, no murmur, distal pulses 2+ b/l Resp: CTABL, no wheezes, normal WOB Ext: No edema, warm Neuro: Alert and oriented, strength equal b/l UE and LE, coordination grossly normal MSK: normal muscle bulk  Assessment & Plan:  Raffaella was seen today for uri.  Diagnoses and all orders for this visit:  Acute non-recurrent  maxillary sinusitis Continue Flonase, try saltwater sprays as well.  Start below.  Take after dialysis. -     doxycycline (VIBRA-TABS) 100 MG tablet; Take 1 tablet (100 mg total) by mouth 2 (two) times daily.  Elevated blood pressure reading Essential hypertension Elevated, continue current medicines. Forgot blood pressure medicines today after dialysis.  Will take them tonight when she gets home.  Let me know if persistently elevated.    Follow up plan: Return if symptoms worsen or fail to improve. [name], MD [place]

## 2018-10-24 DIAGNOSIS — D631 Anemia in chronic kidney disease: Secondary | ICD-10-CM | POA: Diagnosis not present

## 2018-10-24 DIAGNOSIS — N2581 Secondary hyperparathyroidism of renal origin: Secondary | ICD-10-CM | POA: Diagnosis not present

## 2018-10-24 DIAGNOSIS — N186 End stage renal disease: Secondary | ICD-10-CM | POA: Diagnosis not present

## 2018-10-24 DIAGNOSIS — D509 Iron deficiency anemia, unspecified: Secondary | ICD-10-CM | POA: Diagnosis not present

## 2018-10-24 DIAGNOSIS — Z992 Dependence on renal dialysis: Secondary | ICD-10-CM | POA: Diagnosis not present

## 2018-10-26 DIAGNOSIS — N2581 Secondary hyperparathyroidism of renal origin: Secondary | ICD-10-CM | POA: Diagnosis not present

## 2018-10-26 DIAGNOSIS — D631 Anemia in chronic kidney disease: Secondary | ICD-10-CM | POA: Diagnosis not present

## 2018-10-26 DIAGNOSIS — N186 End stage renal disease: Secondary | ICD-10-CM | POA: Diagnosis not present

## 2018-10-26 DIAGNOSIS — Z992 Dependence on renal dialysis: Secondary | ICD-10-CM | POA: Diagnosis not present

## 2018-10-26 DIAGNOSIS — D509 Iron deficiency anemia, unspecified: Secondary | ICD-10-CM | POA: Diagnosis not present

## 2018-10-29 DIAGNOSIS — N2581 Secondary hyperparathyroidism of renal origin: Secondary | ICD-10-CM | POA: Diagnosis not present

## 2018-10-29 DIAGNOSIS — Z992 Dependence on renal dialysis: Secondary | ICD-10-CM | POA: Diagnosis not present

## 2018-10-29 DIAGNOSIS — D631 Anemia in chronic kidney disease: Secondary | ICD-10-CM | POA: Diagnosis not present

## 2018-10-29 DIAGNOSIS — D509 Iron deficiency anemia, unspecified: Secondary | ICD-10-CM | POA: Diagnosis not present

## 2018-10-29 DIAGNOSIS — N186 End stage renal disease: Secondary | ICD-10-CM | POA: Diagnosis not present

## 2018-10-31 DIAGNOSIS — Z992 Dependence on renal dialysis: Secondary | ICD-10-CM | POA: Diagnosis not present

## 2018-10-31 DIAGNOSIS — N186 End stage renal disease: Secondary | ICD-10-CM | POA: Diagnosis not present

## 2018-10-31 DIAGNOSIS — D509 Iron deficiency anemia, unspecified: Secondary | ICD-10-CM | POA: Diagnosis not present

## 2018-10-31 DIAGNOSIS — D631 Anemia in chronic kidney disease: Secondary | ICD-10-CM | POA: Diagnosis not present

## 2018-10-31 DIAGNOSIS — N2581 Secondary hyperparathyroidism of renal origin: Secondary | ICD-10-CM | POA: Diagnosis not present

## 2018-11-02 DIAGNOSIS — N186 End stage renal disease: Secondary | ICD-10-CM | POA: Diagnosis not present

## 2018-11-02 DIAGNOSIS — N2581 Secondary hyperparathyroidism of renal origin: Secondary | ICD-10-CM | POA: Diagnosis not present

## 2018-11-02 DIAGNOSIS — D631 Anemia in chronic kidney disease: Secondary | ICD-10-CM | POA: Diagnosis not present

## 2018-11-02 DIAGNOSIS — D509 Iron deficiency anemia, unspecified: Secondary | ICD-10-CM | POA: Diagnosis not present

## 2018-11-02 DIAGNOSIS — Z992 Dependence on renal dialysis: Secondary | ICD-10-CM | POA: Diagnosis not present

## 2018-11-05 DIAGNOSIS — N2581 Secondary hyperparathyroidism of renal origin: Secondary | ICD-10-CM | POA: Diagnosis not present

## 2018-11-05 DIAGNOSIS — Z992 Dependence on renal dialysis: Secondary | ICD-10-CM | POA: Diagnosis not present

## 2018-11-05 DIAGNOSIS — D631 Anemia in chronic kidney disease: Secondary | ICD-10-CM | POA: Diagnosis not present

## 2018-11-05 DIAGNOSIS — D509 Iron deficiency anemia, unspecified: Secondary | ICD-10-CM | POA: Diagnosis not present

## 2018-11-05 DIAGNOSIS — N186 End stage renal disease: Secondary | ICD-10-CM | POA: Diagnosis not present

## 2018-11-07 DIAGNOSIS — N186 End stage renal disease: Secondary | ICD-10-CM | POA: Diagnosis not present

## 2018-11-07 DIAGNOSIS — Z992 Dependence on renal dialysis: Secondary | ICD-10-CM | POA: Diagnosis not present

## 2018-11-07 DIAGNOSIS — D631 Anemia in chronic kidney disease: Secondary | ICD-10-CM | POA: Diagnosis not present

## 2018-11-07 DIAGNOSIS — N2581 Secondary hyperparathyroidism of renal origin: Secondary | ICD-10-CM | POA: Diagnosis not present

## 2018-11-07 DIAGNOSIS — D509 Iron deficiency anemia, unspecified: Secondary | ICD-10-CM | POA: Diagnosis not present

## 2018-11-09 DIAGNOSIS — Z992 Dependence on renal dialysis: Secondary | ICD-10-CM | POA: Diagnosis not present

## 2018-11-09 DIAGNOSIS — D631 Anemia in chronic kidney disease: Secondary | ICD-10-CM | POA: Diagnosis not present

## 2018-11-09 DIAGNOSIS — N186 End stage renal disease: Secondary | ICD-10-CM | POA: Diagnosis not present

## 2018-11-09 DIAGNOSIS — N2581 Secondary hyperparathyroidism of renal origin: Secondary | ICD-10-CM | POA: Diagnosis not present

## 2018-11-09 DIAGNOSIS — D509 Iron deficiency anemia, unspecified: Secondary | ICD-10-CM | POA: Diagnosis not present

## 2018-11-12 DIAGNOSIS — Z992 Dependence on renal dialysis: Secondary | ICD-10-CM | POA: Diagnosis not present

## 2018-11-12 DIAGNOSIS — D509 Iron deficiency anemia, unspecified: Secondary | ICD-10-CM | POA: Diagnosis not present

## 2018-11-12 DIAGNOSIS — N2581 Secondary hyperparathyroidism of renal origin: Secondary | ICD-10-CM | POA: Diagnosis not present

## 2018-11-12 DIAGNOSIS — D631 Anemia in chronic kidney disease: Secondary | ICD-10-CM | POA: Diagnosis not present

## 2018-11-12 DIAGNOSIS — N186 End stage renal disease: Secondary | ICD-10-CM | POA: Diagnosis not present

## 2018-11-14 DIAGNOSIS — Z992 Dependence on renal dialysis: Secondary | ICD-10-CM | POA: Diagnosis not present

## 2018-11-14 DIAGNOSIS — D509 Iron deficiency anemia, unspecified: Secondary | ICD-10-CM | POA: Diagnosis not present

## 2018-11-14 DIAGNOSIS — D631 Anemia in chronic kidney disease: Secondary | ICD-10-CM | POA: Diagnosis not present

## 2018-11-14 DIAGNOSIS — N186 End stage renal disease: Secondary | ICD-10-CM | POA: Diagnosis not present

## 2018-11-14 DIAGNOSIS — N2581 Secondary hyperparathyroidism of renal origin: Secondary | ICD-10-CM | POA: Diagnosis not present

## 2018-11-16 DIAGNOSIS — N186 End stage renal disease: Secondary | ICD-10-CM | POA: Diagnosis not present

## 2018-11-16 DIAGNOSIS — N2581 Secondary hyperparathyroidism of renal origin: Secondary | ICD-10-CM | POA: Diagnosis not present

## 2018-11-16 DIAGNOSIS — D509 Iron deficiency anemia, unspecified: Secondary | ICD-10-CM | POA: Diagnosis not present

## 2018-11-16 DIAGNOSIS — D631 Anemia in chronic kidney disease: Secondary | ICD-10-CM | POA: Diagnosis not present

## 2018-11-16 DIAGNOSIS — Z992 Dependence on renal dialysis: Secondary | ICD-10-CM | POA: Diagnosis not present

## 2018-11-19 DIAGNOSIS — Z992 Dependence on renal dialysis: Secondary | ICD-10-CM | POA: Diagnosis not present

## 2018-11-19 DIAGNOSIS — D509 Iron deficiency anemia, unspecified: Secondary | ICD-10-CM | POA: Diagnosis not present

## 2018-11-19 DIAGNOSIS — N2581 Secondary hyperparathyroidism of renal origin: Secondary | ICD-10-CM | POA: Diagnosis not present

## 2018-11-19 DIAGNOSIS — N186 End stage renal disease: Secondary | ICD-10-CM | POA: Diagnosis not present

## 2018-11-19 DIAGNOSIS — D631 Anemia in chronic kidney disease: Secondary | ICD-10-CM | POA: Diagnosis not present

## 2018-11-21 DIAGNOSIS — D631 Anemia in chronic kidney disease: Secondary | ICD-10-CM | POA: Diagnosis not present

## 2018-11-21 DIAGNOSIS — N186 End stage renal disease: Secondary | ICD-10-CM | POA: Diagnosis not present

## 2018-11-21 DIAGNOSIS — Z992 Dependence on renal dialysis: Secondary | ICD-10-CM | POA: Diagnosis not present

## 2018-11-21 DIAGNOSIS — D509 Iron deficiency anemia, unspecified: Secondary | ICD-10-CM | POA: Diagnosis not present

## 2018-11-21 DIAGNOSIS — N2581 Secondary hyperparathyroidism of renal origin: Secondary | ICD-10-CM | POA: Diagnosis not present

## 2018-11-23 DIAGNOSIS — D509 Iron deficiency anemia, unspecified: Secondary | ICD-10-CM | POA: Diagnosis not present

## 2018-11-23 DIAGNOSIS — D631 Anemia in chronic kidney disease: Secondary | ICD-10-CM | POA: Diagnosis not present

## 2018-11-23 DIAGNOSIS — N2581 Secondary hyperparathyroidism of renal origin: Secondary | ICD-10-CM | POA: Diagnosis not present

## 2018-11-23 DIAGNOSIS — N186 End stage renal disease: Secondary | ICD-10-CM | POA: Diagnosis not present

## 2018-11-23 DIAGNOSIS — Z992 Dependence on renal dialysis: Secondary | ICD-10-CM | POA: Diagnosis not present

## 2018-11-25 DIAGNOSIS — Z992 Dependence on renal dialysis: Secondary | ICD-10-CM | POA: Diagnosis not present

## 2018-11-25 DIAGNOSIS — D631 Anemia in chronic kidney disease: Secondary | ICD-10-CM | POA: Diagnosis not present

## 2018-11-25 DIAGNOSIS — N2581 Secondary hyperparathyroidism of renal origin: Secondary | ICD-10-CM | POA: Diagnosis not present

## 2018-11-25 DIAGNOSIS — D509 Iron deficiency anemia, unspecified: Secondary | ICD-10-CM | POA: Diagnosis not present

## 2018-11-25 DIAGNOSIS — N186 End stage renal disease: Secondary | ICD-10-CM | POA: Diagnosis not present

## 2018-11-28 DIAGNOSIS — N2581 Secondary hyperparathyroidism of renal origin: Secondary | ICD-10-CM | POA: Diagnosis not present

## 2018-11-28 DIAGNOSIS — Z794 Long term (current) use of insulin: Secondary | ICD-10-CM | POA: Diagnosis not present

## 2018-11-28 DIAGNOSIS — N186 End stage renal disease: Secondary | ICD-10-CM | POA: Diagnosis not present

## 2018-11-28 DIAGNOSIS — E119 Type 2 diabetes mellitus without complications: Secondary | ICD-10-CM | POA: Diagnosis not present

## 2018-11-28 DIAGNOSIS — D509 Iron deficiency anemia, unspecified: Secondary | ICD-10-CM | POA: Diagnosis not present

## 2018-11-28 DIAGNOSIS — D631 Anemia in chronic kidney disease: Secondary | ICD-10-CM | POA: Diagnosis not present

## 2018-11-28 DIAGNOSIS — Z992 Dependence on renal dialysis: Secondary | ICD-10-CM | POA: Diagnosis not present

## 2018-11-30 DIAGNOSIS — N2581 Secondary hyperparathyroidism of renal origin: Secondary | ICD-10-CM | POA: Diagnosis not present

## 2018-11-30 DIAGNOSIS — D509 Iron deficiency anemia, unspecified: Secondary | ICD-10-CM | POA: Diagnosis not present

## 2018-11-30 DIAGNOSIS — D631 Anemia in chronic kidney disease: Secondary | ICD-10-CM | POA: Diagnosis not present

## 2018-11-30 DIAGNOSIS — Z992 Dependence on renal dialysis: Secondary | ICD-10-CM | POA: Diagnosis not present

## 2018-11-30 DIAGNOSIS — N186 End stage renal disease: Secondary | ICD-10-CM | POA: Diagnosis not present

## 2018-12-03 DIAGNOSIS — Z992 Dependence on renal dialysis: Secondary | ICD-10-CM | POA: Diagnosis not present

## 2018-12-03 DIAGNOSIS — D509 Iron deficiency anemia, unspecified: Secondary | ICD-10-CM | POA: Diagnosis not present

## 2018-12-03 DIAGNOSIS — N2581 Secondary hyperparathyroidism of renal origin: Secondary | ICD-10-CM | POA: Diagnosis not present

## 2018-12-03 DIAGNOSIS — D631 Anemia in chronic kidney disease: Secondary | ICD-10-CM | POA: Diagnosis not present

## 2018-12-03 DIAGNOSIS — N186 End stage renal disease: Secondary | ICD-10-CM | POA: Diagnosis not present

## 2018-12-05 DIAGNOSIS — Z992 Dependence on renal dialysis: Secondary | ICD-10-CM | POA: Diagnosis not present

## 2018-12-05 DIAGNOSIS — N186 End stage renal disease: Secondary | ICD-10-CM | POA: Diagnosis not present

## 2018-12-05 DIAGNOSIS — D631 Anemia in chronic kidney disease: Secondary | ICD-10-CM | POA: Diagnosis not present

## 2018-12-05 DIAGNOSIS — D509 Iron deficiency anemia, unspecified: Secondary | ICD-10-CM | POA: Diagnosis not present

## 2018-12-05 DIAGNOSIS — N2581 Secondary hyperparathyroidism of renal origin: Secondary | ICD-10-CM | POA: Diagnosis not present

## 2018-12-07 DIAGNOSIS — D631 Anemia in chronic kidney disease: Secondary | ICD-10-CM | POA: Diagnosis not present

## 2018-12-07 DIAGNOSIS — N2581 Secondary hyperparathyroidism of renal origin: Secondary | ICD-10-CM | POA: Diagnosis not present

## 2018-12-07 DIAGNOSIS — D509 Iron deficiency anemia, unspecified: Secondary | ICD-10-CM | POA: Diagnosis not present

## 2018-12-07 DIAGNOSIS — N186 End stage renal disease: Secondary | ICD-10-CM | POA: Diagnosis not present

## 2018-12-07 DIAGNOSIS — Z992 Dependence on renal dialysis: Secondary | ICD-10-CM | POA: Diagnosis not present

## 2018-12-09 ENCOUNTER — Other Ambulatory Visit: Payer: Self-pay | Admitting: Pediatrics

## 2018-12-10 DIAGNOSIS — N2581 Secondary hyperparathyroidism of renal origin: Secondary | ICD-10-CM | POA: Diagnosis not present

## 2018-12-10 DIAGNOSIS — N186 End stage renal disease: Secondary | ICD-10-CM | POA: Diagnosis not present

## 2018-12-10 DIAGNOSIS — D631 Anemia in chronic kidney disease: Secondary | ICD-10-CM | POA: Diagnosis not present

## 2018-12-10 DIAGNOSIS — Z992 Dependence on renal dialysis: Secondary | ICD-10-CM | POA: Diagnosis not present

## 2018-12-10 DIAGNOSIS — D509 Iron deficiency anemia, unspecified: Secondary | ICD-10-CM | POA: Diagnosis not present

## 2018-12-12 DIAGNOSIS — D509 Iron deficiency anemia, unspecified: Secondary | ICD-10-CM | POA: Diagnosis not present

## 2018-12-12 DIAGNOSIS — D631 Anemia in chronic kidney disease: Secondary | ICD-10-CM | POA: Diagnosis not present

## 2018-12-12 DIAGNOSIS — Z992 Dependence on renal dialysis: Secondary | ICD-10-CM | POA: Diagnosis not present

## 2018-12-12 DIAGNOSIS — N2581 Secondary hyperparathyroidism of renal origin: Secondary | ICD-10-CM | POA: Diagnosis not present

## 2018-12-12 DIAGNOSIS — N186 End stage renal disease: Secondary | ICD-10-CM | POA: Diagnosis not present

## 2018-12-13 ENCOUNTER — Encounter: Payer: Self-pay | Admitting: Pediatrics

## 2018-12-13 ENCOUNTER — Ambulatory Visit (INDEPENDENT_AMBULATORY_CARE_PROVIDER_SITE_OTHER): Payer: Medicare Other | Admitting: Pediatrics

## 2018-12-13 VITALS — BP 131/66 | HR 63 | Temp 97.8°F | Ht 63.0 in | Wt 176.6 lb

## 2018-12-13 DIAGNOSIS — E1121 Type 2 diabetes mellitus with diabetic nephropathy: Secondary | ICD-10-CM

## 2018-12-13 DIAGNOSIS — F419 Anxiety disorder, unspecified: Secondary | ICD-10-CM

## 2018-12-13 DIAGNOSIS — Z23 Encounter for immunization: Secondary | ICD-10-CM

## 2018-12-13 DIAGNOSIS — I10 Essential (primary) hypertension: Secondary | ICD-10-CM

## 2018-12-13 DIAGNOSIS — E785 Hyperlipidemia, unspecified: Secondary | ICD-10-CM

## 2018-12-13 MED ORDER — INSULIN GLARGINE 100 UNIT/ML SOLOSTAR PEN: PEN_INJECTOR | SUBCUTANEOUS | 2 refills | Status: DC

## 2018-12-13 MED ORDER — PRAVASTATIN SODIUM 20 MG PO TABS: 20.0000 mg | ORAL_TABLET | Freq: Every day | ORAL | 3 refills | Status: DC

## 2018-12-13 MED ORDER — ATENOLOL 50 MG PO TABS: 50.0000 mg | ORAL_TABLET | Freq: Two times a day (BID) | ORAL | 4 refills | Status: DC

## 2018-12-13 MED ORDER — LORAZEPAM 0.5 MG PO TABS: 0.5000 mg | ORAL_TABLET | Freq: Three times a day (TID) | ORAL | 1 refills | Status: DC | PRN

## 2018-12-13 MED ORDER — ESCITALOPRAM OXALATE 10 MG PO TABS: 10.0000 mg | ORAL_TABLET | Freq: Every day | ORAL | 1 refills | Status: DC

## 2018-12-13 NOTE — Progress Notes (Signed)
  Subjective:   Patient ID: [name], female    DOB: [birth year], 68 y.o.   MRN: [id number] CC: Medical Management of Chronic Issues  HPI: [name] is a 68 y.o. female   Diabetes: Taking 15 units of insulin glargine at night.  Blood sugars in the morning usually low 100s.  Sometimes slightly higher if she has something sweet tea before bed.  Most recent A1c through dialysis labs was 6.8 in 11/29/2018.  Anxiety: Takes Lexapro daily.  She does think it helps her symptoms.  Takes Ativan only when she really needs it.  Not every day.  Hypertension: Taking medicines regularly.  No lightheadedness, dizziness.  Hyperlipidemia: Tolerating statin, no side effects  Some nasal congestion off and on, sometimes coughing, using Flonase regularly.  No fevers, no facial pain.  Appetite has been fine.  Relevant past medical, surgical, family and social history reviewed. Allergies and medications reviewed and updated. Social History   Tobacco Use  Smoking Status Former Smoker  . Packs/day: 1.00  . Years: 30.00  . Pack years: 30.00  . Last attempt to quit: 12/04/2001  . Years since quitting: 17.0  Smokeless Tobacco Never Used   ROS: Per HPI   Objective:    BP 131/66   Pulse 63   Temp 97.8 F (36.6 C) (Oral)   Ht 5\' 3"  (1.6 m)   Wt 176 lb 9.6 oz (80.1 kg)   BMI 31.28 kg/m   Wt Readings from Last 3 Encounters:  12/13/18 176 lb 9.6 oz (80.1 kg)  10/22/18 176 lb 12.8 oz (80.2 kg)  09/11/18 174 lb 12.8 oz (79.3 kg)    Gen: NAD, alert, cooperative with exam, NCAT EYES: EOMI, no conjunctival injection, or no icterus ENT:  TMs pearly gray b/l, OP without erythema LYMPH: no cervical LAD CV: NRRR, normal S1/S2, no murmur, distal pulses 2+ b/l Resp: CTABL, no wheezes, normal WOB Abd: +BS, soft, NTND. no guarding or organomegaly Ext: No edema, warm Neuro: Alert and oriented MSK: normal muscle bulk  Assessment & Plan:  [name] was seen today for medical management of chronic  issues.  Diagnoses and all orders for this visit:  Type II diabetes mellitus with nephropathy ([place]) A1c 6.8 via documentation from dialysis.  Continue current medicines.  Let me know if any lows less than 90 -     Bayer DCA Hb A1c Waived -     Insulin Glargine (LANTUS SOLOSTAR) 100 UNIT/ML Solostar Pen; INJECT 15 TO 25 UNITS INTO THE SKIN DAILY AT 10 PM.  Anxiety Stable, continue below -     escitalopram (LEXAPRO) 10 MG tablet; Take 1 tablet (10 mg total) by mouth daily. -     LORazepam (ATIVAN) 0.5 MG tablet; Take 1 tablet (0.5 mg total) by mouth every 8 (eight) hours as needed for anxiety.  Essential hypertension Stable, continue below -     atenolol (TENORMIN) 50 MG tablet; Take 1 tablet (50 mg total) by mouth 2 (two) times daily.  Hyperlipidemia, unspecified hyperlipidemia type Stable, continue below -     pravastatin (PRAVACHOL) 20 MG tablet; Take 1 tablet (20 mg total) by mouth daily.  Other orders -     Pneumococcal polysaccharide vaccine 23-valent greater than or equal to 2yo subcutaneous/IM   Follow up plan: Return in about 3 months (around 03/14/2019). [name], MD [place]

## 2018-12-14 DIAGNOSIS — N2581 Secondary hyperparathyroidism of renal origin: Secondary | ICD-10-CM | POA: Diagnosis not present

## 2018-12-14 DIAGNOSIS — D631 Anemia in chronic kidney disease: Secondary | ICD-10-CM | POA: Diagnosis not present

## 2018-12-14 DIAGNOSIS — N186 End stage renal disease: Secondary | ICD-10-CM | POA: Diagnosis not present

## 2018-12-14 DIAGNOSIS — D509 Iron deficiency anemia, unspecified: Secondary | ICD-10-CM | POA: Diagnosis not present

## 2018-12-14 DIAGNOSIS — Z992 Dependence on renal dialysis: Secondary | ICD-10-CM | POA: Diagnosis not present

## 2018-12-17 DIAGNOSIS — D509 Iron deficiency anemia, unspecified: Secondary | ICD-10-CM | POA: Diagnosis not present

## 2018-12-17 DIAGNOSIS — Z992 Dependence on renal dialysis: Secondary | ICD-10-CM | POA: Diagnosis not present

## 2018-12-17 DIAGNOSIS — N2581 Secondary hyperparathyroidism of renal origin: Secondary | ICD-10-CM | POA: Diagnosis not present

## 2018-12-17 DIAGNOSIS — D631 Anemia in chronic kidney disease: Secondary | ICD-10-CM | POA: Diagnosis not present

## 2018-12-17 DIAGNOSIS — N186 End stage renal disease: Secondary | ICD-10-CM | POA: Diagnosis not present

## 2018-12-19 DIAGNOSIS — N186 End stage renal disease: Secondary | ICD-10-CM | POA: Diagnosis not present

## 2018-12-19 DIAGNOSIS — D631 Anemia in chronic kidney disease: Secondary | ICD-10-CM | POA: Diagnosis not present

## 2018-12-19 DIAGNOSIS — D509 Iron deficiency anemia, unspecified: Secondary | ICD-10-CM | POA: Diagnosis not present

## 2018-12-19 DIAGNOSIS — Z992 Dependence on renal dialysis: Secondary | ICD-10-CM | POA: Diagnosis not present

## 2018-12-19 DIAGNOSIS — N2581 Secondary hyperparathyroidism of renal origin: Secondary | ICD-10-CM | POA: Diagnosis not present

## 2018-12-21 DIAGNOSIS — Z992 Dependence on renal dialysis: Secondary | ICD-10-CM | POA: Diagnosis not present

## 2018-12-21 DIAGNOSIS — D509 Iron deficiency anemia, unspecified: Secondary | ICD-10-CM | POA: Diagnosis not present

## 2018-12-21 DIAGNOSIS — D631 Anemia in chronic kidney disease: Secondary | ICD-10-CM | POA: Diagnosis not present

## 2018-12-21 DIAGNOSIS — N186 End stage renal disease: Secondary | ICD-10-CM | POA: Diagnosis not present

## 2018-12-21 DIAGNOSIS — N2581 Secondary hyperparathyroidism of renal origin: Secondary | ICD-10-CM | POA: Diagnosis not present

## 2018-12-23 DIAGNOSIS — Z1231 Encounter for screening mammogram for malignant neoplasm of breast: Secondary | ICD-10-CM | POA: Diagnosis not present

## 2018-12-23 LAB — HM MAMMOGRAPHY

## 2018-12-24 DIAGNOSIS — D631 Anemia in chronic kidney disease: Secondary | ICD-10-CM | POA: Diagnosis not present

## 2018-12-24 DIAGNOSIS — D509 Iron deficiency anemia, unspecified: Secondary | ICD-10-CM | POA: Diagnosis not present

## 2018-12-24 DIAGNOSIS — N186 End stage renal disease: Secondary | ICD-10-CM | POA: Diagnosis not present

## 2018-12-24 DIAGNOSIS — Z992 Dependence on renal dialysis: Secondary | ICD-10-CM | POA: Diagnosis not present

## 2018-12-24 DIAGNOSIS — N2581 Secondary hyperparathyroidism of renal origin: Secondary | ICD-10-CM | POA: Diagnosis not present

## 2018-12-26 DIAGNOSIS — D631 Anemia in chronic kidney disease: Secondary | ICD-10-CM | POA: Diagnosis not present

## 2018-12-26 DIAGNOSIS — Z992 Dependence on renal dialysis: Secondary | ICD-10-CM | POA: Diagnosis not present

## 2018-12-26 DIAGNOSIS — N186 End stage renal disease: Secondary | ICD-10-CM | POA: Diagnosis not present

## 2018-12-26 DIAGNOSIS — D509 Iron deficiency anemia, unspecified: Secondary | ICD-10-CM | POA: Diagnosis not present

## 2018-12-26 DIAGNOSIS — N2581 Secondary hyperparathyroidism of renal origin: Secondary | ICD-10-CM | POA: Diagnosis not present

## 2018-12-28 DIAGNOSIS — D509 Iron deficiency anemia, unspecified: Secondary | ICD-10-CM | POA: Diagnosis not present

## 2018-12-28 DIAGNOSIS — N2581 Secondary hyperparathyroidism of renal origin: Secondary | ICD-10-CM | POA: Diagnosis not present

## 2018-12-28 DIAGNOSIS — N186 End stage renal disease: Secondary | ICD-10-CM | POA: Diagnosis not present

## 2018-12-28 DIAGNOSIS — Z992 Dependence on renal dialysis: Secondary | ICD-10-CM | POA: Diagnosis not present

## 2018-12-28 DIAGNOSIS — D631 Anemia in chronic kidney disease: Secondary | ICD-10-CM | POA: Diagnosis not present

## 2018-12-30 ENCOUNTER — Encounter: Payer: Self-pay | Admitting: Family Medicine

## 2018-12-30 ENCOUNTER — Ambulatory Visit (INDEPENDENT_AMBULATORY_CARE_PROVIDER_SITE_OTHER): Payer: Medicare Other | Admitting: Family Medicine

## 2018-12-30 VITALS — BP 138/84 | HR 90 | Temp 96.9°F | Ht 63.0 in | Wt 179.0 lb

## 2018-12-30 DIAGNOSIS — J441 Chronic obstructive pulmonary disease with (acute) exacerbation: Secondary | ICD-10-CM | POA: Diagnosis not present

## 2018-12-30 DIAGNOSIS — R062 Wheezing: Secondary | ICD-10-CM | POA: Diagnosis not present

## 2018-12-30 MED ORDER — BENZONATATE 200 MG PO CAPS: 200.0000 mg | ORAL_CAPSULE | Freq: Two times a day (BID) | ORAL | 0 refills | Status: DC | PRN

## 2018-12-30 MED ORDER — METHYLPREDNISOLONE ACETATE 80 MG/ML IJ SUSP
80.0000 mg | Freq: Once | INTRAMUSCULAR | Status: AC
  Administered 2018-12-30: 80 mg via INTRAMUSCULAR

## 2018-12-30 MED ORDER — DOXYCYCLINE HYCLATE 100 MG PO TABS: 100.0000 mg | ORAL_TABLET | Freq: Two times a day (BID) | ORAL | 0 refills | Status: DC

## 2018-12-30 MED ORDER — PREDNISONE 20 MG PO TABS: ORAL_TABLET | ORAL | 0 refills | Status: DC

## 2018-12-30 MED ORDER — BUDESONIDE-FORMOTEROL FUMARATE 160-4.5 MCG/ACT IN AERO: 2.0000 | INHALATION_SPRAY | Freq: Two times a day (BID) | RESPIRATORY_TRACT | 3 refills | Status: DC | PRN

## 2018-12-30 NOTE — Progress Notes (Signed)
Subjective:    Patient ID: [name], female    DOB: 1951-03-04, 68 y.o.   MRN: [id number]  Chief Complaint:  Bronchitis (chest congestion, cough; taken several otc cough/cold)   HPI: [name] is a 68 y.o. female presenting on 12/30/2018 for Bronchitis (chest congestion, cough; taken several otc cough/cold)  Pt presents today with complaints of cough, congestion, fatigue, increased sputum production, and shortness of breath with exertion. Pt states this stared in October. She states she improved for a few weeks and then it returned this month. States she did have a fever yesterday. States she is concerned it will progress to pneumonia. States she has been using her Symbicort with some relief.   Relevant past medical, surgical, family, and social history reviewed and updated as indicated.  Allergies and medications reviewed and updated.   Past Medical History:  Diagnosis Date  . Acute on chronic diastolic CHF (congestive heart failure) ([place]) 03/05/2015   Grade 2. EF 60-65%  . Anemia    hx of  . Anxiety   . Arthritis   . CAD (coronary artery disease)    s/p Stenting in 2005;  [place] 7/09: Vigorous LV function, 2-3+ MR, RI 40%, RI stent patent, proximal-mid RCA 40-50%;  Echo 7/09:  EF 55-65%, trivial MR;  [place] 11/18/12: EF 66%, normal LV wall motion, mild to moderate anterior ischemia - reviewed by [facility] and felt to rep breast atten (low risk);  Echo 6/14: mild LVH, EF 55-65%, Gr 2 DD, PASP 44, trivial eff   . Chronic kidney disease    CREATININE IS UP--GOING TO KIDNEY MD   . Chronic neck pain   . COPD (chronic obstructive pulmonary disease) ([place])   . Degenerative joint disease    left shoulder  . Diabetic neuropathy ([place])   . Diverticulosis   . Esophageal stricture   . GERD (gastroesophageal reflux disease)   . Hyperlipidemia   . Hypertension   . IDDM (insulin dependent diabetes mellitus) ([place])   . Pericardial effusion 03/06/2015   Very small per echo ; pleural nodules  seen on CT chest.  . Peripheral vascular disease ([place])   . Stroke [facility])    "mini- years ago"  . Vitamin D deficiency     Past Surgical History:  Procedure Laterality Date  . A/V FISTULAGRAM N/A 08/10/2017   Procedure: A/V Fistulagram - Right Arm;  Surgeon: Elam Dutch, MD;  Location: [place] [facility];  Service: Cardiovascular;  Laterality: N/A;  . ABDOMINAL HYSTERECTOMY    . AV FISTULA PLACEMENT Right 07/20/2015   Procedure: RADIAL CEPHALIC ARTERIOVENOUS FISTULA CREATION RIGHT ARM;  Surgeon: Angelia Mould, MD;  Location: [place];  Service: Vascular;  Laterality: Right;  . AV FISTULA PLACEMENT Right 11/26/2015   Procedure:  Creation of RIGHT BRACHIO-CEPHALIC arteriovenous fistula;  Surgeon: Angelia Mould, MD;  Location: [place];  Service: Vascular;  Laterality: Right;  . BACK SURGERY     2007 & 2013  . BLADDER SURGERY     Tack  . CARDIAC CATHETERIZATION  2003, 2005, 2009   1 stent  . cardiac stents    . CATARACT EXTRACTION W/PHACO Right 05/04/2014   Procedure: CATARACT EXTRACTION PHACO AND INTRAOCULAR LENS PLACEMENT (IOC);  Surgeon: Williams Che, MD;  Location: [facility];  Service: Ophthalmology;  Laterality: Right;  CDE 1.47  . CATARACT EXTRACTION W/PHACO Left 07/20/2014   Procedure: CATARACT EXTRACTION WITH PHACO AND INTRAOCULAR LENS PLACEMENT LEFT EYE CDE=1.79;  Surgeon: Kayleen Memos  Karel Jarvis, MD;  Location: [facility];  Service: Ophthalmology;  Laterality: Left;  . CERVICAL SPINE SURGERY  2012   8 screws  . CYSTOSCOPY WITH INJECTION N/A 06/03/2013   Procedure: MACROPLASTIQUE  INJECTION ;  Surgeon: Malka So, MD;  Location: [facility];  Service: Urology;  Laterality: N/A;  . FISTULOGRAM Right 10/20/2016   Procedure: FISTULOGRAM WITH POSSIBLE INTERVENTION;  Surgeon: [name], MD;  Location: [facility];  Service: Vascular;  Laterality: Right;  . INSERTION OF DIALYSIS CATHETER N/A 11/26/2015   Procedure: INSERTION OF DIALYSIS CATHETER;  Surgeon: Angelia Mould, MD;   Location: [place];  Service: Vascular;  Laterality: N/A;  . LAMINECTOMY  12/29/2011  . LUMBAR LAMINECTOMY/DECOMPRESSION MICRODISCECTOMY  12/29/2011   Procedure: LUMBAR LAMINECTOMY/DECOMPRESSION MICRODISCECTOMY;  Surgeon: Floyce Stakes, MD;  Location: [place] [facility];  Service: Neurosurgery;  Laterality: N/A;  Lumbar Two through Lumbar Five Laminectomies/Cellsaver  . PARTIAL HYSTERECTOMY    . PERIPHERAL VASCULAR CATHETERIZATION N/A 11/22/2015   Procedure: Fistulagram;  Surgeon: Angelia Mould, MD;  Location: [place] [facility];  Service: Cardiovascular;  Laterality: N/A;  . PERIPHERAL VASCULAR INTERVENTION  08/10/2017   Procedure: PERIPHERAL VASCULAR INTERVENTION;  Surgeon: Elam Dutch, MD;  Location: [place] [facility];  Service: Cardiovascular;;  . SHOULDER SURGERY Right    Rotator cuff    Social History   Socioeconomic History  . Marital status: Widowed    Spouse name: Not on file  . Number of children: 2  . Years of education: GED  . Highest education level: GED or equivalent  Occupational History  . Occupation: Disabled  Social Needs  . Financial resource strain: Not very hard  . Food insecurity:    Worry: Never true    Inability: Never true  . Transportation needs:    Medical: No    Non-medical: No  Tobacco Use  . Smoking status: Former Smoker    Packs/day: 1.00    Years: 30.00    Pack years: 30.00    Last attempt to quit: 12/04/2001    Years since quitting: 17.0  . Smokeless tobacco: Never Used  Substance and Sexual Activity  . Alcohol use: No    Alcohol/week: 6.0 standard drinks    Types: 6 Cans of beer per week  . Drug use: No  . Sexual activity: Not Currently    Birth control/protection: Surgical  Lifestyle  . Physical activity:    Days per week: 0 days    Minutes per session: 0 min  . Stress: Only a little  Relationships  . Social connections:    Talks on phone: More than three times a week    Gets together: More than three times a week    Attends  religious service: More than 4 times per year    Active member of club or organization: Yes    Attends meetings of clubs or organizations: More than 4 times per year    Relationship status: Widowed  . Intimate partner violence:    Fear of current or ex partner: No    Emotionally abused: No    Physically abused: No    Forced sexual activity: No  Other Topics Concern  . Not on file  Social History Narrative   Lives in [place]    Outpatient Encounter Medications as of 12/30/2018  Medication Sig  . albuterol (PROVENTIL HFA;VENTOLIN HFA) 108 (90 Base) MCG/ACT inhaler Inhale 2 puffs into the lungs every 6 (six) hours as needed for wheezing or  shortness of breath.  [name] albuterol (PROVENTIL) (2.5 MG/3ML) 0.083% nebulizer solution Take 3 mLs (2.5 mg total) by nebulization every 6 (six) hours as needed for wheezing or shortness of breath.  [name] aspirin 81 MG EC tablet Take 81 mg by mouth daily.    [name] atenolol (TENORMIN) 50 MG tablet Take 1 tablet (50 mg total) by mouth 2 (two) times daily.  . budesonide-formoterol (SYMBICORT) 160-4.5 MCG/ACT inhaler Inhale 2 puffs into the lungs 2 (two) times daily as needed for up to 30 days.  . calcium acetate (PHOSLO) 667 MG capsule Take 667-1,334 mg by mouth See admin instructions. Take 2 capsules with meals 3 times daily and 1 capsule with snacks  . Carboxymethylcellulose Sodium (THERATEARS OP) Place 2 drops into both eyes as needed (for dry eyes). Reported on 12/09/2015  . docusate sodium (COLACE) 100 MG capsule Take 100 mg by mouth 2 (two) times daily as needed for mild constipation.  [name] escitalopram (LEXAPRO) 10 MG tablet Take 1 tablet (10 mg total) by mouth daily.  [name] glucose blood test strip Test 4 times daily  . hydrALAZINE (APRESOLINE) 50 MG tablet Take 1 tablet (50 mg total) by mouth 2 (two) times daily.  . Insulin Glargine (LANTUS SOLOSTAR) 100 UNIT/ML Solostar Pen INJECT 15 TO 25 UNITS INTO THE SKIN DAILY AT 10 PM.  . LORazepam (ATIVAN) 0.5 MG tablet Take 1  tablet (0.5 mg total) by mouth every 8 (eight) hours as needed for anxiety.  [name] NIFEdipine (PROCARDIA XL/ADALAT-CC) 90 MG 24 hr tablet Take 90 mg by mouth every evening.   . nitroGLYCERIN (NITROSTAT) 0.4 MG SL tablet Place 1 tablet (0.4 mg total) under the tongue every 5 (five) minutes as needed for chest pain.  . pravastatin (PRAVACHOL) 20 MG tablet Take 1 tablet (20 mg total) by mouth daily.  [name] torsemide (DEMADEX) 20 MG tablet 20 mg. Take two tablets by mouth once daily  . [DISCONTINUED] budesonide-formoterol (SYMBICORT) 160-4.5 MCG/ACT inhaler Inhale 2 puffs into the lungs 2 (two) times daily as needed.  . benzonatate (TESSALON) 200 MG capsule Take 1 capsule (200 mg total) by mouth 2 (two) times daily as needed for cough.  . doxycycline (VIBRA-TABS) 100 MG tablet Take 1 tablet (100 mg total) by mouth 2 (two) times daily.  . predniSONE (DELTASONE) 20 MG tablet 2 po at sametime daily for 5 days- start tomorrow  . [EXPIRED] methylPREDNISolone acetate (DEPO-MEDROL) injection 80 mg    No facility-administered encounter medications on file as of 12/30/2018.     Allergies  Allergen Reactions  . Azithromycin     Prolonged QT  . Ace Inhibitors Cough  . Clopidogrel Bisulfate Other (See Comments)    REACTION: Sick, Headache, "Felt terrible"  . Codeine Other (See Comments)    REACTION: hallucinations  . Lisinopril Other (See Comments)    cough  . Penicillins Other (See Comments)    Has patient had a PCN reaction causing immediate rash, facial/tongue/throat swelling, SOB or lightheadedness with hypotension: unknown Has patient had a PCN reaction causing severe rash involving mucus membranes or skin necrosis: unknown Has patient had a PCN reaction that required hospitalization unknown Has patient had a PCN reaction occurring within the last 10 years:unknown If all of the above answers are "NO", then may proceed with Cephalosporin use.     Review of Systems  Constitutional: Positive for activity  change, chills, fatigue and fever.  HENT: Positive for congestion and rhinorrhea. Negative for sore throat.   Respiratory: Positive for cough (increased  sputum production), shortness of breath (with exertion) and wheezing. Negative for apnea, choking, chest tightness and stridor.   Cardiovascular: Negative for chest pain, palpitations and leg swelling.  Neurological: Negative for dizziness, tremors, weakness, light-headedness, numbness and headaches.  Psychiatric/Behavioral: Negative for confusion.        Objective:    BP 138/84   Pulse 90   Temp (!) 96.9 F (36.1 C) (Oral)   Ht 5\' 3"  (1.6 m)   Wt 179 lb (81.2 kg)   SpO2 96%   BMI 31.71 kg/m    Wt Readings from Last 3 Encounters:  12/30/18 179 lb (81.2 kg)  12/13/18 176 lb 9.6 oz (80.1 kg)  10/22/18 176 lb 12.8 oz (80.2 kg)    Physical Exam Vitals signs and nursing note reviewed.  Constitutional:      General: She is in acute distress (mild).     Appearance: She is well-developed and well-groomed. She is not ill-appearing or toxic-appearing.  HENT:     Head: Normocephalic and atraumatic.     Right Ear: Tympanic membrane, ear canal and external ear normal.     Left Ear: Tympanic membrane, ear canal and external ear normal.     Nose: Nose normal.     Mouth/Throat:     Mouth: Mucous membranes are moist.     Pharynx: Oropharynx is clear.  Eyes:     Conjunctiva/sclera: Conjunctivae normal.     Pupils: Pupils are equal, round, and reactive to light.  Neck:     Musculoskeletal: Full passive range of motion without pain and neck supple.     Trachea: Trachea and phonation normal.  Cardiovascular:     Rate and Rhythm: Normal rate and regular rhythm.     Heart sounds: Normal heart sounds. No murmur. No friction rub. No gallop.   Pulmonary:     Effort: Pulmonary effort is normal. Prolonged expiration present.     Breath sounds: Examination of the right-upper field reveals wheezing. Examination of the left-upper field reveals  wheezing. Examination of the right-lower field reveals wheezing. Examination of the left-lower field reveals wheezing. Wheezing present. No rhonchi or rales.  Lymphadenopathy:     Cervical: No cervical adenopathy.  Skin:    General: Skin is warm and dry.     Capillary Refill: Capillary refill takes less than 2 seconds.     Coloration: Skin is not cyanotic.  Neurological:     General: No focal deficit present.     Mental Status: She is alert and oriented to person, place, and time.  Psychiatric:        Mood and Affect: Mood normal.        Behavior: Behavior normal. Behavior is cooperative.        Thought Content: Thought content normal.        Judgment: Judgment normal.     Results for orders placed or performed in visit on 04/08/18  HM DIABETES EYE EXAM  Result Value Ref Range   HM Diabetic Eye Exam No Retinopathy No Retinopathy       Pertinent labs & imaging results that were available during my care of the patient were reviewed by me and considered in my medical decision making.  Assessment & Plan:  Louvenia was seen today for bronchitis.  Diagnoses and all orders for this visit:  COPD with acute exacerbation ([place]) Adequate hydration. Report any new or worsening symptoms. Medications as prescribed. Dosing around dialysis discussed.  -     doxycycline (VIBRA-TABS) 100 MG  tablet; Take 1 tablet (100 mg total) by mouth 2 (two) times daily. -     benzonatate (TESSALON) 200 MG capsule; Take 1 capsule (200 mg total) by mouth 2 (two) times daily as needed for cough. -     methylPREDNISolone acetate (DEPO-MEDROL) injection 80 mg -     predniSONE (DELTASONE) 20 MG tablet; 2 po at sametime daily for 5 days- start tomorrow  Wheezing Medications as prescribed. Report any new or worsening symptoms.  -     methylPREDNISolone acetate (DEPO-MEDROL) injection 80 mg -     predniSONE (DELTASONE) 20 MG tablet; 2 po at sametime daily for 5 days- start tomorrow     Continue all other  maintenance medications.  Follow up plan: Return in about 4 weeks (around 01/27/2019), or if symptoms worsen or fail to improve.  Educational handout given for COPD exacerbation  The above assessment and management plan was discussed with the patient. The patient verbalized understanding of and has agreed to the management plan. Patient is aware to call the clinic if symptoms persist or worsen. Patient is aware when to return to the clinic for a follow-up visit. Patient educated on when it is appropriate to go to the emergency department.   Monia Pouch, FNP-C [facility] [phone]

## 2018-12-30 NOTE — Patient Instructions (Signed)

## 2018-12-31 DIAGNOSIS — N186 End stage renal disease: Secondary | ICD-10-CM | POA: Diagnosis not present

## 2018-12-31 DIAGNOSIS — N2581 Secondary hyperparathyroidism of renal origin: Secondary | ICD-10-CM | POA: Diagnosis not present

## 2018-12-31 DIAGNOSIS — D509 Iron deficiency anemia, unspecified: Secondary | ICD-10-CM | POA: Diagnosis not present

## 2018-12-31 DIAGNOSIS — Z992 Dependence on renal dialysis: Secondary | ICD-10-CM | POA: Diagnosis not present

## 2018-12-31 DIAGNOSIS — D631 Anemia in chronic kidney disease: Secondary | ICD-10-CM | POA: Diagnosis not present

## 2019-01-02 DIAGNOSIS — D509 Iron deficiency anemia, unspecified: Secondary | ICD-10-CM | POA: Diagnosis not present

## 2019-01-02 DIAGNOSIS — N2581 Secondary hyperparathyroidism of renal origin: Secondary | ICD-10-CM | POA: Diagnosis not present

## 2019-01-02 DIAGNOSIS — Z992 Dependence on renal dialysis: Secondary | ICD-10-CM | POA: Diagnosis not present

## 2019-01-02 DIAGNOSIS — D631 Anemia in chronic kidney disease: Secondary | ICD-10-CM | POA: Diagnosis not present

## 2019-01-02 DIAGNOSIS — N186 End stage renal disease: Secondary | ICD-10-CM | POA: Diagnosis not present

## 2019-01-03 DIAGNOSIS — N186 End stage renal disease: Secondary | ICD-10-CM | POA: Diagnosis not present

## 2019-01-03 DIAGNOSIS — Z992 Dependence on renal dialysis: Secondary | ICD-10-CM | POA: Diagnosis not present

## 2019-01-04 DIAGNOSIS — N2581 Secondary hyperparathyroidism of renal origin: Secondary | ICD-10-CM | POA: Diagnosis not present

## 2019-01-04 DIAGNOSIS — N186 End stage renal disease: Secondary | ICD-10-CM | POA: Diagnosis not present

## 2019-01-04 DIAGNOSIS — D509 Iron deficiency anemia, unspecified: Secondary | ICD-10-CM | POA: Diagnosis not present

## 2019-01-04 DIAGNOSIS — D631 Anemia in chronic kidney disease: Secondary | ICD-10-CM | POA: Diagnosis not present

## 2019-01-04 DIAGNOSIS — Z992 Dependence on renal dialysis: Secondary | ICD-10-CM | POA: Diagnosis not present

## 2019-01-07 DIAGNOSIS — D509 Iron deficiency anemia, unspecified: Secondary | ICD-10-CM | POA: Diagnosis not present

## 2019-01-07 DIAGNOSIS — D631 Anemia in chronic kidney disease: Secondary | ICD-10-CM | POA: Diagnosis not present

## 2019-01-07 DIAGNOSIS — Z992 Dependence on renal dialysis: Secondary | ICD-10-CM | POA: Diagnosis not present

## 2019-01-07 DIAGNOSIS — N186 End stage renal disease: Secondary | ICD-10-CM | POA: Diagnosis not present

## 2019-01-07 DIAGNOSIS — N2581 Secondary hyperparathyroidism of renal origin: Secondary | ICD-10-CM | POA: Diagnosis not present

## 2019-01-09 DIAGNOSIS — N186 End stage renal disease: Secondary | ICD-10-CM | POA: Diagnosis not present

## 2019-01-09 DIAGNOSIS — D509 Iron deficiency anemia, unspecified: Secondary | ICD-10-CM | POA: Diagnosis not present

## 2019-01-09 DIAGNOSIS — D631 Anemia in chronic kidney disease: Secondary | ICD-10-CM | POA: Diagnosis not present

## 2019-01-09 DIAGNOSIS — Z992 Dependence on renal dialysis: Secondary | ICD-10-CM | POA: Diagnosis not present

## 2019-01-09 DIAGNOSIS — N2581 Secondary hyperparathyroidism of renal origin: Secondary | ICD-10-CM | POA: Diagnosis not present

## 2019-01-11 DIAGNOSIS — N186 End stage renal disease: Secondary | ICD-10-CM | POA: Diagnosis not present

## 2019-01-11 DIAGNOSIS — N2581 Secondary hyperparathyroidism of renal origin: Secondary | ICD-10-CM | POA: Diagnosis not present

## 2019-01-11 DIAGNOSIS — D509 Iron deficiency anemia, unspecified: Secondary | ICD-10-CM | POA: Diagnosis not present

## 2019-01-11 DIAGNOSIS — D631 Anemia in chronic kidney disease: Secondary | ICD-10-CM | POA: Diagnosis not present

## 2019-01-11 DIAGNOSIS — Z992 Dependence on renal dialysis: Secondary | ICD-10-CM | POA: Diagnosis not present

## 2019-01-13 ENCOUNTER — Other Ambulatory Visit: Payer: Self-pay | Admitting: Pediatrics

## 2019-01-13 DIAGNOSIS — E785 Hyperlipidemia, unspecified: Secondary | ICD-10-CM

## 2019-01-14 DIAGNOSIS — D509 Iron deficiency anemia, unspecified: Secondary | ICD-10-CM | POA: Diagnosis not present

## 2019-01-14 DIAGNOSIS — N2581 Secondary hyperparathyroidism of renal origin: Secondary | ICD-10-CM | POA: Diagnosis not present

## 2019-01-14 DIAGNOSIS — Z992 Dependence on renal dialysis: Secondary | ICD-10-CM | POA: Diagnosis not present

## 2019-01-14 DIAGNOSIS — D631 Anemia in chronic kidney disease: Secondary | ICD-10-CM | POA: Diagnosis not present

## 2019-01-14 DIAGNOSIS — N186 End stage renal disease: Secondary | ICD-10-CM | POA: Diagnosis not present

## 2019-01-16 DIAGNOSIS — N186 End stage renal disease: Secondary | ICD-10-CM | POA: Diagnosis not present

## 2019-01-16 DIAGNOSIS — Z992 Dependence on renal dialysis: Secondary | ICD-10-CM | POA: Diagnosis not present

## 2019-01-16 DIAGNOSIS — N2581 Secondary hyperparathyroidism of renal origin: Secondary | ICD-10-CM | POA: Diagnosis not present

## 2019-01-16 DIAGNOSIS — D631 Anemia in chronic kidney disease: Secondary | ICD-10-CM | POA: Diagnosis not present

## 2019-01-16 DIAGNOSIS — D509 Iron deficiency anemia, unspecified: Secondary | ICD-10-CM | POA: Diagnosis not present

## 2019-01-18 DIAGNOSIS — Z992 Dependence on renal dialysis: Secondary | ICD-10-CM | POA: Diagnosis not present

## 2019-01-18 DIAGNOSIS — D509 Iron deficiency anemia, unspecified: Secondary | ICD-10-CM | POA: Diagnosis not present

## 2019-01-18 DIAGNOSIS — D631 Anemia in chronic kidney disease: Secondary | ICD-10-CM | POA: Diagnosis not present

## 2019-01-18 DIAGNOSIS — N186 End stage renal disease: Secondary | ICD-10-CM | POA: Diagnosis not present

## 2019-01-18 DIAGNOSIS — N2581 Secondary hyperparathyroidism of renal origin: Secondary | ICD-10-CM | POA: Diagnosis not present

## 2019-01-21 DIAGNOSIS — N186 End stage renal disease: Secondary | ICD-10-CM | POA: Diagnosis not present

## 2019-01-21 DIAGNOSIS — D631 Anemia in chronic kidney disease: Secondary | ICD-10-CM | POA: Diagnosis not present

## 2019-01-21 DIAGNOSIS — Z992 Dependence on renal dialysis: Secondary | ICD-10-CM | POA: Diagnosis not present

## 2019-01-21 DIAGNOSIS — N2581 Secondary hyperparathyroidism of renal origin: Secondary | ICD-10-CM | POA: Diagnosis not present

## 2019-01-21 DIAGNOSIS — D509 Iron deficiency anemia, unspecified: Secondary | ICD-10-CM | POA: Diagnosis not present

## 2019-01-23 DIAGNOSIS — D631 Anemia in chronic kidney disease: Secondary | ICD-10-CM | POA: Diagnosis not present

## 2019-01-23 DIAGNOSIS — D509 Iron deficiency anemia, unspecified: Secondary | ICD-10-CM | POA: Diagnosis not present

## 2019-01-23 DIAGNOSIS — Z992 Dependence on renal dialysis: Secondary | ICD-10-CM | POA: Diagnosis not present

## 2019-01-23 DIAGNOSIS — N2581 Secondary hyperparathyroidism of renal origin: Secondary | ICD-10-CM | POA: Diagnosis not present

## 2019-01-23 DIAGNOSIS — N186 End stage renal disease: Secondary | ICD-10-CM | POA: Diagnosis not present

## 2019-01-24 ENCOUNTER — Encounter: Payer: Self-pay | Admitting: *Deleted

## 2019-01-25 DIAGNOSIS — Z992 Dependence on renal dialysis: Secondary | ICD-10-CM | POA: Diagnosis not present

## 2019-01-25 DIAGNOSIS — N186 End stage renal disease: Secondary | ICD-10-CM | POA: Diagnosis not present

## 2019-01-25 DIAGNOSIS — D509 Iron deficiency anemia, unspecified: Secondary | ICD-10-CM | POA: Diagnosis not present

## 2019-01-25 DIAGNOSIS — N2581 Secondary hyperparathyroidism of renal origin: Secondary | ICD-10-CM | POA: Diagnosis not present

## 2019-01-25 DIAGNOSIS — D631 Anemia in chronic kidney disease: Secondary | ICD-10-CM | POA: Diagnosis not present

## 2019-01-28 DIAGNOSIS — N2581 Secondary hyperparathyroidism of renal origin: Secondary | ICD-10-CM | POA: Diagnosis not present

## 2019-01-28 DIAGNOSIS — Z992 Dependence on renal dialysis: Secondary | ICD-10-CM | POA: Diagnosis not present

## 2019-01-28 DIAGNOSIS — D631 Anemia in chronic kidney disease: Secondary | ICD-10-CM | POA: Diagnosis not present

## 2019-01-28 DIAGNOSIS — D509 Iron deficiency anemia, unspecified: Secondary | ICD-10-CM | POA: Diagnosis not present

## 2019-01-28 DIAGNOSIS — N186 End stage renal disease: Secondary | ICD-10-CM | POA: Diagnosis not present

## 2019-01-30 DIAGNOSIS — Z992 Dependence on renal dialysis: Secondary | ICD-10-CM | POA: Diagnosis not present

## 2019-01-30 DIAGNOSIS — N186 End stage renal disease: Secondary | ICD-10-CM | POA: Diagnosis not present

## 2019-01-30 DIAGNOSIS — N2581 Secondary hyperparathyroidism of renal origin: Secondary | ICD-10-CM | POA: Diagnosis not present

## 2019-01-30 DIAGNOSIS — D631 Anemia in chronic kidney disease: Secondary | ICD-10-CM | POA: Diagnosis not present

## 2019-01-30 DIAGNOSIS — D509 Iron deficiency anemia, unspecified: Secondary | ICD-10-CM | POA: Diagnosis not present

## 2019-02-01 DIAGNOSIS — D631 Anemia in chronic kidney disease: Secondary | ICD-10-CM | POA: Diagnosis not present

## 2019-02-01 DIAGNOSIS — N186 End stage renal disease: Secondary | ICD-10-CM | POA: Diagnosis not present

## 2019-02-01 DIAGNOSIS — N2581 Secondary hyperparathyroidism of renal origin: Secondary | ICD-10-CM | POA: Diagnosis not present

## 2019-02-01 DIAGNOSIS — Z992 Dependence on renal dialysis: Secondary | ICD-10-CM | POA: Diagnosis not present

## 2019-02-01 DIAGNOSIS — D509 Iron deficiency anemia, unspecified: Secondary | ICD-10-CM | POA: Diagnosis not present

## 2019-02-04 DIAGNOSIS — Z992 Dependence on renal dialysis: Secondary | ICD-10-CM | POA: Diagnosis not present

## 2019-02-04 DIAGNOSIS — D509 Iron deficiency anemia, unspecified: Secondary | ICD-10-CM | POA: Diagnosis not present

## 2019-02-04 DIAGNOSIS — D631 Anemia in chronic kidney disease: Secondary | ICD-10-CM | POA: Diagnosis not present

## 2019-02-04 DIAGNOSIS — N186 End stage renal disease: Secondary | ICD-10-CM | POA: Diagnosis not present

## 2019-02-04 DIAGNOSIS — N2581 Secondary hyperparathyroidism of renal origin: Secondary | ICD-10-CM | POA: Diagnosis not present

## 2019-02-06 DIAGNOSIS — D631 Anemia in chronic kidney disease: Secondary | ICD-10-CM | POA: Diagnosis not present

## 2019-02-06 DIAGNOSIS — Z992 Dependence on renal dialysis: Secondary | ICD-10-CM | POA: Diagnosis not present

## 2019-02-06 DIAGNOSIS — N2581 Secondary hyperparathyroidism of renal origin: Secondary | ICD-10-CM | POA: Diagnosis not present

## 2019-02-06 DIAGNOSIS — N186 End stage renal disease: Secondary | ICD-10-CM | POA: Diagnosis not present

## 2019-02-06 DIAGNOSIS — D509 Iron deficiency anemia, unspecified: Secondary | ICD-10-CM | POA: Diagnosis not present

## 2019-02-08 DIAGNOSIS — N186 End stage renal disease: Secondary | ICD-10-CM | POA: Diagnosis not present

## 2019-02-08 DIAGNOSIS — N2581 Secondary hyperparathyroidism of renal origin: Secondary | ICD-10-CM | POA: Diagnosis not present

## 2019-02-08 DIAGNOSIS — D631 Anemia in chronic kidney disease: Secondary | ICD-10-CM | POA: Diagnosis not present

## 2019-02-08 DIAGNOSIS — Z992 Dependence on renal dialysis: Secondary | ICD-10-CM | POA: Diagnosis not present

## 2019-02-08 DIAGNOSIS — D509 Iron deficiency anemia, unspecified: Secondary | ICD-10-CM | POA: Diagnosis not present

## 2019-02-11 DIAGNOSIS — D631 Anemia in chronic kidney disease: Secondary | ICD-10-CM | POA: Diagnosis not present

## 2019-02-11 DIAGNOSIS — D509 Iron deficiency anemia, unspecified: Secondary | ICD-10-CM | POA: Diagnosis not present

## 2019-02-11 DIAGNOSIS — N186 End stage renal disease: Secondary | ICD-10-CM | POA: Diagnosis not present

## 2019-02-11 DIAGNOSIS — Z992 Dependence on renal dialysis: Secondary | ICD-10-CM | POA: Diagnosis not present

## 2019-02-11 DIAGNOSIS — N2581 Secondary hyperparathyroidism of renal origin: Secondary | ICD-10-CM | POA: Diagnosis not present

## 2019-02-13 DIAGNOSIS — Z992 Dependence on renal dialysis: Secondary | ICD-10-CM | POA: Diagnosis not present

## 2019-02-13 DIAGNOSIS — N186 End stage renal disease: Secondary | ICD-10-CM | POA: Diagnosis not present

## 2019-02-13 DIAGNOSIS — N2581 Secondary hyperparathyroidism of renal origin: Secondary | ICD-10-CM | POA: Diagnosis not present

## 2019-02-13 DIAGNOSIS — D509 Iron deficiency anemia, unspecified: Secondary | ICD-10-CM | POA: Diagnosis not present

## 2019-02-13 DIAGNOSIS — D631 Anemia in chronic kidney disease: Secondary | ICD-10-CM | POA: Diagnosis not present

## 2019-02-15 DIAGNOSIS — N2581 Secondary hyperparathyroidism of renal origin: Secondary | ICD-10-CM | POA: Diagnosis not present

## 2019-02-15 DIAGNOSIS — D509 Iron deficiency anemia, unspecified: Secondary | ICD-10-CM | POA: Diagnosis not present

## 2019-02-15 DIAGNOSIS — Z992 Dependence on renal dialysis: Secondary | ICD-10-CM | POA: Diagnosis not present

## 2019-02-15 DIAGNOSIS — D631 Anemia in chronic kidney disease: Secondary | ICD-10-CM | POA: Diagnosis not present

## 2019-02-15 DIAGNOSIS — N186 End stage renal disease: Secondary | ICD-10-CM | POA: Diagnosis not present

## 2019-02-18 DIAGNOSIS — N2581 Secondary hyperparathyroidism of renal origin: Secondary | ICD-10-CM | POA: Diagnosis not present

## 2019-02-18 DIAGNOSIS — D631 Anemia in chronic kidney disease: Secondary | ICD-10-CM | POA: Diagnosis not present

## 2019-02-18 DIAGNOSIS — N186 End stage renal disease: Secondary | ICD-10-CM | POA: Diagnosis not present

## 2019-02-18 DIAGNOSIS — D509 Iron deficiency anemia, unspecified: Secondary | ICD-10-CM | POA: Diagnosis not present

## 2019-02-18 DIAGNOSIS — Z992 Dependence on renal dialysis: Secondary | ICD-10-CM | POA: Diagnosis not present

## 2019-02-20 DIAGNOSIS — Z992 Dependence on renal dialysis: Secondary | ICD-10-CM | POA: Diagnosis not present

## 2019-02-20 DIAGNOSIS — N2581 Secondary hyperparathyroidism of renal origin: Secondary | ICD-10-CM | POA: Diagnosis not present

## 2019-02-20 DIAGNOSIS — D509 Iron deficiency anemia, unspecified: Secondary | ICD-10-CM | POA: Diagnosis not present

## 2019-02-20 DIAGNOSIS — D631 Anemia in chronic kidney disease: Secondary | ICD-10-CM | POA: Diagnosis not present

## 2019-02-20 DIAGNOSIS — N186 End stage renal disease: Secondary | ICD-10-CM | POA: Diagnosis not present

## 2019-02-22 DIAGNOSIS — Z992 Dependence on renal dialysis: Secondary | ICD-10-CM | POA: Diagnosis not present

## 2019-02-22 DIAGNOSIS — D509 Iron deficiency anemia, unspecified: Secondary | ICD-10-CM | POA: Diagnosis not present

## 2019-02-22 DIAGNOSIS — D631 Anemia in chronic kidney disease: Secondary | ICD-10-CM | POA: Diagnosis not present

## 2019-02-22 DIAGNOSIS — N2581 Secondary hyperparathyroidism of renal origin: Secondary | ICD-10-CM | POA: Diagnosis not present

## 2019-02-22 DIAGNOSIS — N186 End stage renal disease: Secondary | ICD-10-CM | POA: Diagnosis not present

## 2019-02-25 DIAGNOSIS — N186 End stage renal disease: Secondary | ICD-10-CM | POA: Diagnosis not present

## 2019-02-25 DIAGNOSIS — D509 Iron deficiency anemia, unspecified: Secondary | ICD-10-CM | POA: Diagnosis not present

## 2019-02-25 DIAGNOSIS — D631 Anemia in chronic kidney disease: Secondary | ICD-10-CM | POA: Diagnosis not present

## 2019-02-25 DIAGNOSIS — Z992 Dependence on renal dialysis: Secondary | ICD-10-CM | POA: Diagnosis not present

## 2019-02-25 DIAGNOSIS — E119 Type 2 diabetes mellitus without complications: Secondary | ICD-10-CM | POA: Diagnosis not present

## 2019-02-25 DIAGNOSIS — Z794 Long term (current) use of insulin: Secondary | ICD-10-CM | POA: Diagnosis not present

## 2019-02-25 DIAGNOSIS — N2581 Secondary hyperparathyroidism of renal origin: Secondary | ICD-10-CM | POA: Diagnosis not present

## 2019-02-27 DIAGNOSIS — D509 Iron deficiency anemia, unspecified: Secondary | ICD-10-CM | POA: Diagnosis not present

## 2019-02-27 DIAGNOSIS — Z992 Dependence on renal dialysis: Secondary | ICD-10-CM | POA: Diagnosis not present

## 2019-02-27 DIAGNOSIS — N186 End stage renal disease: Secondary | ICD-10-CM | POA: Diagnosis not present

## 2019-02-27 DIAGNOSIS — N2581 Secondary hyperparathyroidism of renal origin: Secondary | ICD-10-CM | POA: Diagnosis not present

## 2019-02-27 DIAGNOSIS — D631 Anemia in chronic kidney disease: Secondary | ICD-10-CM | POA: Diagnosis not present

## 2019-03-01 DIAGNOSIS — D509 Iron deficiency anemia, unspecified: Secondary | ICD-10-CM | POA: Diagnosis not present

## 2019-03-01 DIAGNOSIS — D631 Anemia in chronic kidney disease: Secondary | ICD-10-CM | POA: Diagnosis not present

## 2019-03-01 DIAGNOSIS — N2581 Secondary hyperparathyroidism of renal origin: Secondary | ICD-10-CM | POA: Diagnosis not present

## 2019-03-01 DIAGNOSIS — N186 End stage renal disease: Secondary | ICD-10-CM | POA: Diagnosis not present

## 2019-03-01 DIAGNOSIS — Z992 Dependence on renal dialysis: Secondary | ICD-10-CM | POA: Diagnosis not present

## 2019-03-04 DIAGNOSIS — D509 Iron deficiency anemia, unspecified: Secondary | ICD-10-CM | POA: Diagnosis not present

## 2019-03-04 DIAGNOSIS — Z992 Dependence on renal dialysis: Secondary | ICD-10-CM | POA: Diagnosis not present

## 2019-03-04 DIAGNOSIS — D631 Anemia in chronic kidney disease: Secondary | ICD-10-CM | POA: Diagnosis not present

## 2019-03-04 DIAGNOSIS — N2581 Secondary hyperparathyroidism of renal origin: Secondary | ICD-10-CM | POA: Diagnosis not present

## 2019-03-04 DIAGNOSIS — N186 End stage renal disease: Secondary | ICD-10-CM | POA: Diagnosis not present

## 2019-03-06 DIAGNOSIS — N186 End stage renal disease: Secondary | ICD-10-CM | POA: Diagnosis not present

## 2019-03-06 DIAGNOSIS — D509 Iron deficiency anemia, unspecified: Secondary | ICD-10-CM | POA: Diagnosis not present

## 2019-03-06 DIAGNOSIS — N2581 Secondary hyperparathyroidism of renal origin: Secondary | ICD-10-CM | POA: Diagnosis not present

## 2019-03-06 DIAGNOSIS — Z992 Dependence on renal dialysis: Secondary | ICD-10-CM | POA: Diagnosis not present

## 2019-03-06 DIAGNOSIS — D631 Anemia in chronic kidney disease: Secondary | ICD-10-CM | POA: Diagnosis not present

## 2019-03-08 DIAGNOSIS — Z992 Dependence on renal dialysis: Secondary | ICD-10-CM | POA: Diagnosis not present

## 2019-03-08 DIAGNOSIS — N186 End stage renal disease: Secondary | ICD-10-CM | POA: Diagnosis not present

## 2019-03-08 DIAGNOSIS — D509 Iron deficiency anemia, unspecified: Secondary | ICD-10-CM | POA: Diagnosis not present

## 2019-03-08 DIAGNOSIS — N2581 Secondary hyperparathyroidism of renal origin: Secondary | ICD-10-CM | POA: Diagnosis not present

## 2019-03-08 DIAGNOSIS — D631 Anemia in chronic kidney disease: Secondary | ICD-10-CM | POA: Diagnosis not present

## 2019-03-11 DIAGNOSIS — D631 Anemia in chronic kidney disease: Secondary | ICD-10-CM | POA: Diagnosis not present

## 2019-03-11 DIAGNOSIS — N186 End stage renal disease: Secondary | ICD-10-CM | POA: Diagnosis not present

## 2019-03-11 DIAGNOSIS — D509 Iron deficiency anemia, unspecified: Secondary | ICD-10-CM | POA: Diagnosis not present

## 2019-03-11 DIAGNOSIS — Z992 Dependence on renal dialysis: Secondary | ICD-10-CM | POA: Diagnosis not present

## 2019-03-11 DIAGNOSIS — N2581 Secondary hyperparathyroidism of renal origin: Secondary | ICD-10-CM | POA: Diagnosis not present

## 2019-03-13 DIAGNOSIS — D509 Iron deficiency anemia, unspecified: Secondary | ICD-10-CM | POA: Diagnosis not present

## 2019-03-13 DIAGNOSIS — Z992 Dependence on renal dialysis: Secondary | ICD-10-CM | POA: Diagnosis not present

## 2019-03-13 DIAGNOSIS — N186 End stage renal disease: Secondary | ICD-10-CM | POA: Diagnosis not present

## 2019-03-13 DIAGNOSIS — D631 Anemia in chronic kidney disease: Secondary | ICD-10-CM | POA: Diagnosis not present

## 2019-03-13 DIAGNOSIS — N2581 Secondary hyperparathyroidism of renal origin: Secondary | ICD-10-CM | POA: Diagnosis not present

## 2019-03-15 DIAGNOSIS — Z992 Dependence on renal dialysis: Secondary | ICD-10-CM | POA: Diagnosis not present

## 2019-03-15 DIAGNOSIS — N2581 Secondary hyperparathyroidism of renal origin: Secondary | ICD-10-CM | POA: Diagnosis not present

## 2019-03-15 DIAGNOSIS — D509 Iron deficiency anemia, unspecified: Secondary | ICD-10-CM | POA: Diagnosis not present

## 2019-03-15 DIAGNOSIS — D631 Anemia in chronic kidney disease: Secondary | ICD-10-CM | POA: Diagnosis not present

## 2019-03-15 DIAGNOSIS — N186 End stage renal disease: Secondary | ICD-10-CM | POA: Diagnosis not present

## 2019-03-17 ENCOUNTER — Ambulatory Visit: Payer: Medicare Other | Admitting: Family Medicine

## 2019-03-18 DIAGNOSIS — Z992 Dependence on renal dialysis: Secondary | ICD-10-CM | POA: Diagnosis not present

## 2019-03-18 DIAGNOSIS — N186 End stage renal disease: Secondary | ICD-10-CM | POA: Diagnosis not present

## 2019-03-18 DIAGNOSIS — D631 Anemia in chronic kidney disease: Secondary | ICD-10-CM | POA: Diagnosis not present

## 2019-03-18 DIAGNOSIS — D509 Iron deficiency anemia, unspecified: Secondary | ICD-10-CM | POA: Diagnosis not present

## 2019-03-18 DIAGNOSIS — N2581 Secondary hyperparathyroidism of renal origin: Secondary | ICD-10-CM | POA: Diagnosis not present

## 2019-03-19 ENCOUNTER — Ambulatory Visit (INDEPENDENT_AMBULATORY_CARE_PROVIDER_SITE_OTHER): Payer: Medicare Other | Admitting: Family Medicine

## 2019-03-19 ENCOUNTER — Encounter: Payer: Self-pay | Admitting: Family Medicine

## 2019-03-19 ENCOUNTER — Other Ambulatory Visit: Payer: Self-pay

## 2019-03-19 DIAGNOSIS — E1159 Type 2 diabetes mellitus with other circulatory complications: Secondary | ICD-10-CM | POA: Diagnosis not present

## 2019-03-19 DIAGNOSIS — I10 Essential (primary) hypertension: Secondary | ICD-10-CM | POA: Diagnosis not present

## 2019-03-19 DIAGNOSIS — N186 End stage renal disease: Secondary | ICD-10-CM | POA: Diagnosis not present

## 2019-03-19 DIAGNOSIS — J441 Chronic obstructive pulmonary disease with (acute) exacerbation: Secondary | ICD-10-CM

## 2019-03-19 DIAGNOSIS — E1121 Type 2 diabetes mellitus with diabetic nephropathy: Secondary | ICD-10-CM | POA: Diagnosis not present

## 2019-03-19 DIAGNOSIS — F419 Anxiety disorder, unspecified: Secondary | ICD-10-CM | POA: Diagnosis not present

## 2019-03-19 DIAGNOSIS — I152 Hypertension secondary to endocrine disorders: Secondary | ICD-10-CM

## 2019-03-19 MED ORDER — HYDROXYZINE HCL 10 MG PO TABS: 10.0000 mg | ORAL_TABLET | Freq: Three times a day (TID) | ORAL | 3 refills | Status: AC | PRN

## 2019-03-19 MED ORDER — ESCITALOPRAM OXALATE 20 MG PO TABS: 20.0000 mg | ORAL_TABLET | Freq: Every day | ORAL | 6 refills | Status: DC

## 2019-03-19 MED ORDER — DOXYCYCLINE HYCLATE 100 MG PO TABS: 100.0000 mg | ORAL_TABLET | Freq: Two times a day (BID) | ORAL | 0 refills | Status: AC

## 2019-03-19 MED ORDER — PREDNISONE 20 MG PO TABS: ORAL_TABLET | ORAL | 0 refills | Status: DC

## 2019-03-19 NOTE — Progress Notes (Signed)
Virtual Visit via telephone Note Due to COVID-19, visit is conducted virtually and was requested by patient.  I connected with [name] on 03/19/19 at 0835 by telephone and verified that I am speaking with the correct person using two identifiers. [name] is currently located at home and family is currently with them during visit. The provider, Monia Pouch, FNP is located in their office at time of visit.  I discussed the limitations, risks, security and privacy concerns of performing an evaluation and management service by telephone and the availability of in person appointments. I also discussed with the patient that there may be a patient responsible charge related to this service. The patient expressed understanding and agreed to proceed.  Subjective:  Patient ID: [name], female    DOB: [birth year], 68 y.o.   MRN: [id number]  Chief Complaint:  No chief complaint on file.   HPI: [name] is a 68 y.o. female presenting on 03/19/2019 for No chief complaint on file.   1. End stage renal disease [facility]) Dialysis on Tue, Thur, Sat. Doing well with dialysis. States she had labs completed on 02/25/2019 during dialysis, will have these faxed to the office.   2. Hypertension associated with diabetes ([place]) Complaint with meds - Yes Checking BP at home ranging 135/80 Exercising Regularly - No Watching Salt intake - Yes Pertinent ROS:  Headache - No Chest pain - No Dyspnea - Yes Palpitations - No LE edema - Yes They report good compliance with medications and can restate their regimen by memory. No medication side effects.  BP Readings from Last 3 Encounters:  12/30/18 138/84  12/13/18 131/66  10/22/18 (!) 165/71     3. Type II diabetes mellitus with nephropathy (HCC) Diabetes mellitus 2 Compliant with meds - Yes Checking CBGs? No Exercising regularly? - No Watching carbohydrate intake? - Yes Neuropathy ? - No Hypoglycemic events - No Pertinent  ROS:  Polyuria - No Polydipsia - No Vision problems - No  A1C 6.3 on 02/25/2019 at dialysis. Will get copy of lab work.  4. Anxiety States she has noticed an increase in anxiety during her dialysis sessions. States she has needed to take her Ativan during her treatments due to increased anxiety. She states she is taking her Lexapro as prescribed but is still having anxiety. States this has increased over the last few weeks due to COVID-19. No SI or HI.  GAD 7 : Generalized Anxiety Score 03/19/2019  Nervous, Anxious, on Edge 2  Control/stop worrying 1  Worry too much - different things 1  Trouble relaxing 0  Restless 0  Easily annoyed or irritable 0  Afraid - awful might happen 1  Total GAD 7 Score 5     5. COPD with acute exacerbation ([place]) Pt states she has had increased SHOB with exertion, increased cough and congestion, and increased sputum production with increased sputum viscosity. Pt states this started on Monday and is getting worse. She denies fever or chills. No weakness or confusion. States she has had to use her SABA more frequently over the last 2 days.     Relevant past medical, surgical, family, and social history reviewed and updated as indicated.  Allergies and medications reviewed and updated.   Past Medical History:  Diagnosis Date  . Acute on chronic diastolic CHF (congestive heart failure) ([place]) 03/05/2015   Grade 2. EF 60-65%  . Anemia    hx of  . Anxiety   . Arthritis   .  CAD (coronary artery disease)    s/p Stenting in 2005;  [place] 7/09: Vigorous LV function, 2-3+ MR, RI 40%, RI stent patent, proximal-mid RCA 40-50%;  Echo 7/09:  EF 55-65%, trivial MR;  Myoview 11/18/12: EF 66%, normal LV wall motion, mild to moderate anterior ischemia - reviewed by [facility] and felt to rep breast atten (low risk);  Echo 6/14: mild LVH, EF 55-65%, Gr 2 DD, PASP 44, trivial eff   . Chronic kidney disease    CREATININE IS UP--GOING TO KIDNEY MD   . Chronic neck pain   . COPD  (chronic obstructive pulmonary disease) ([place])   . Degenerative joint disease    left shoulder  . Diabetic neuropathy ([place])   . Diverticulosis   . Esophageal stricture   . GERD (gastroesophageal reflux disease)   . Hyperlipidemia   . Hypertension   . IDDM (insulin dependent diabetes mellitus) ([place])   . Pericardial effusion 03/06/2015   Very small per echo ; pleural nodules seen on CT chest.  . Peripheral vascular disease ([place])   . Stroke [facility])    "mini- years ago"  . Vitamin D deficiency     Past Surgical History:  Procedure Laterality Date  . A/V FISTULAGRAM N/A 08/10/2017   Procedure: A/V Fistulagram - Right Arm;  Surgeon: Elam Dutch, MD;  Location: [place] CV [facility];  Service: Cardiovascular;  Laterality: N/A;  . ABDOMINAL HYSTERECTOMY    . AV FISTULA PLACEMENT Right 07/20/2015   Procedure: RADIAL CEPHALIC ARTERIOVENOUS FISTULA CREATION RIGHT ARM;  Surgeon: Angelia Mould, MD;  Location: [place];  Service: Vascular;  Laterality: Right;  . AV FISTULA PLACEMENT Right 11/26/2015   Procedure:  Creation of RIGHT BRACHIO-CEPHALIC arteriovenous fistula;  Surgeon: Angelia Mould, MD;  Location: [place];  Service: Vascular;  Laterality: Right;  . BACK SURGERY     2007 & 2013  . BLADDER SURGERY     Tack  . CARDIAC CATHETERIZATION  2003, 2005, 2009   1 stent  . cardiac stents    . CATARACT EXTRACTION W/PHACO Right 05/04/2014   Procedure: CATARACT EXTRACTION PHACO AND INTRAOCULAR LENS PLACEMENT (I[facility]);  Surgeon: Williams Che, MD;  Location: [facility];  Service: Ophthalmology;  Laterality: Right;  CDE 1.47  . CATARACT EXTRACTION W/PHACO Left 07/20/2014   Procedure: CATARACT EXTRACTION WITH PHACO AND INTRAOCULAR LENS PLACEMENT LEFT EYE CDE=1.79;  Surgeon: Williams Che, MD;  Location: [facility];  Service: Ophthalmology;  Laterality: Left;  . CERVICAL SPINE SURGERY  2012   8 screws  . CYSTOSCOPY WITH INJECTION N/A 06/03/2013   Procedure: MACROPLASTIQUE  INJECTION ;  Surgeon: Malka So, MD;  Location: [facility];  Service: Urology;  Laterality: N/A;  . FISTULOGRAM Right 10/20/2016   Procedure: FISTULOGRAM WITH POSSIBLE INTERVENTION;  Surgeon: [name], MD;  Location: [facility];  Service: Vascular;  Laterality: Right;  . INSERTION OF DIALYSIS CATHETER N/A 11/26/2015   Procedure: INSERTION OF DIALYSIS CATHETER;  Surgeon: Angelia Mould, MD;  Location: [place];  Service: Vascular;  Laterality: N/A;  . LAMINECTOMY  12/29/2011  . LUMBAR LAMINECTOMY/DECOMPRESSION MICRODISCECTOMY  12/29/2011   Procedure: LUMBAR LAMINECTOMY/DECOMPRESSION MICRODISCECTOMY;  Surgeon: Floyce Stakes, MD;  Location: [place] [facility];  Service: Neurosurgery;  Laterality: N/A;  Lumbar Two through Lumbar Five Laminectomies/Cellsaver  . PARTIAL HYSTERECTOMY    . PERIPHERAL VASCULAR CATHETERIZATION N/A 11/22/2015   Procedure: Fistulagram;  Surgeon: Angelia Mould, MD;  Location: [place] CV [facility];  Service: Cardiovascular;  Laterality:  N/A;  . PERIPHERAL VASCULAR INTERVENTION  08/10/2017   Procedure: PERIPHERAL VASCULAR INTERVENTION;  Surgeon: Elam Dutch, MD;  Location: [place] [facility];  Service: Cardiovascular;;  . SHOULDER SURGERY Right    Rotator cuff    Social History   Socioeconomic History  . Marital status: Widowed    Spouse name: Not on file  . Number of children: 2  . Years of education: GED  . Highest education level: GED or equivalent  Occupational History  . Occupation: Disabled  Social Needs  . Financial resource strain: Not very hard  . Food insecurity:    Worry: Never true    Inability: Never true  . Transportation needs:    Medical: No    Non-medical: No  Tobacco Use  . Smoking status: Former Smoker    Packs/day: 1.00    Years: 30.00    Pack years: 30.00    Last attempt to quit: 12/04/2001    Years since quitting: 17.2  . Smokeless tobacco: Never Used  Substance and Sexual Activity  . Alcohol use: No    Alcohol/week: 6.0 standard drinks    Types:  6 Cans of beer per week  . Drug use: No  . Sexual activity: Not Currently    Birth control/protection: Surgical  Lifestyle  . Physical activity:    Days per week: 0 days    Minutes per session: 0 min  . Stress: Only a little  Relationships  . Social connections:    Talks on phone: More than three times a week    Gets together: More than three times a week    Attends religious service: More than 4 times per year    Active member of club or organization: Yes    Attends meetings of clubs or organizations: More than 4 times per year    Relationship status: Widowed  . Intimate partner violence:    Fear of current or ex partner: No    Emotionally abused: No    Physically abused: No    Forced sexual activity: No  Other Topics Concern  . Not on file  Social History Narrative   Lives in [place]    Outpatient Encounter Medications as of 03/19/2019  Medication Sig  . albuterol (PROVENTIL HFA;VENTOLIN HFA) 108 (90 Base) MCG/ACT inhaler Inhale 2 puffs into the lungs every 6 (six) hours as needed for wheezing or shortness of breath.  [name] albuterol (PROVENTIL) (2.5 MG/3ML) 0.083% nebulizer solution Take 3 mLs (2.5 mg total) by nebulization every 6 (six) hours as needed for wheezing or shortness of breath.  [name] aspirin 81 MG EC tablet Take 81 mg by mouth daily.    [name] atenolol (TENORMIN) 50 MG tablet Take 1 tablet (50 mg total) by mouth 2 (two) times daily.  . benzonatate (TESSALON) 200 MG capsule Take 1 capsule (200 mg total) by mouth 2 (two) times daily as needed for cough.  . budesonide-formoterol (SYMBICORT) 160-4.5 MCG/ACT inhaler Inhale 2 puffs into the lungs 2 (two) times daily as needed for up to 30 days.  . calcium acetate (PHOSLO) 667 MG capsule Take 667-1,334 mg by mouth See admin instructions. Take 2 capsules with meals 3 times daily and 1 capsule with snacks  . Carboxymethylcellulose Sodium (THERATEARS OP) Place 2 drops into both eyes as needed (for dry eyes). Reported on 12/09/2015  .  docusate sodium (COLACE) 100 MG capsule Take 100 mg by mouth 2 (two) times daily as needed for mild constipation.  [name] doxycycline (VIBRA-TABS) 100  MG tablet Take 1 tablet (100 mg total) by mouth 2 (two) times daily for 7 days. 1 po bid  . escitalopram (LEXAPRO) 20 MG tablet Take 1 tablet (20 mg total) by mouth daily for 30 days.  [name] glucose blood test strip Test 4 times daily  . hydrALAZINE (APRESOLINE) 50 MG tablet Take 1 tablet (50 mg total) by mouth 2 (two) times daily.  . hydrOXYzine (ATARAX/VISTARIL) 10 MG tablet Take 1 tablet (10 mg total) by mouth 3 (three) times daily as needed for up to 30 days.  . Insulin Glargine (LANTUS SOLOSTAR) 100 UNIT/ML Solostar Pen INJECT 15 TO 25 UNITS INTO THE SKIN DAILY AT 10 PM.  . NIFEdipine (PROCARDIA XL/ADALAT-CC) 90 MG 24 hr tablet Take 90 mg by mouth every evening.   . nitroGLYCERIN (NITROSTAT) 0.4 MG SL tablet Place 1 tablet (0.4 mg total) under the tongue every 5 (five) minutes as needed for chest pain.  . pravastatin (PRAVACHOL) 20 MG tablet TAKE 1 TABLET BY MOUTH ONCE DAILY.  [name] predniSONE (DELTASONE) 20 MG tablet 2 po at sametime daily for 5 days  . torsemide (DEMADEX) 20 MG tablet 20 mg. Take two tablets by mouth once daily  . [DISCONTINUED] doxycycline (VIBRA-TABS) 100 MG tablet Take 1 tablet (100 mg total) by mouth 2 (two) times daily.  . [DISCONTINUED] escitalopram (LEXAPRO) 10 MG tablet Take 1 tablet (10 mg total) by mouth daily.  . [DISCONTINUED] LORazepam (ATIVAN) 0.5 MG tablet Take 1 tablet (0.5 mg total) by mouth every 8 (eight) hours as needed for anxiety.  . [DISCONTINUED] predniSONE (DELTASONE) 20 MG tablet 2 po at sametime daily for 5 days- start tomorrow   No facility-administered encounter medications on file as of 03/19/2019.     Allergies  Allergen Reactions  . Azithromycin     Prolonged QT  . Ace Inhibitors Cough  . Clopidogrel Bisulfate Other (See Comments)    REACTION: Sick, Headache, "Felt terrible"  . Codeine Other (See  Comments)    REACTION: hallucinations  . Lisinopril Other (See Comments)    cough  . Penicillins Other (See Comments)    Has patient had a PCN reaction causing immediate rash, facial/tongue/throat swelling, SOB or lightheadedness with hypotension: unknown Has patient had a PCN reaction causing severe rash involving mucus membranes or skin necrosis: unknown Has patient had a PCN reaction that required hospitalization unknown Has patient had a PCN reaction occurring within the last 10 years:unknown If all of the above answers are "NO", then may proceed with Cephalosporin use.     Review of Systems  Constitutional: Negative for activity change, chills, fatigue, fever and unexpected weight change.  HENT: Positive for congestion. Negative for trouble swallowing and voice change.   Eyes: Negative for photophobia and visual disturbance.  Respiratory: Positive for cough, shortness of breath and wheezing. Negative for chest tightness.   Cardiovascular: Positive for leg swelling. Negative for chest pain and palpitations.  Gastrointestinal: Negative for abdominal distention, abdominal pain, anal bleeding, blood in stool, constipation, diarrhea, nausea and vomiting.  Endocrine: Negative for polydipsia, polyphagia and polyuria.  Musculoskeletal: Negative for arthralgias and myalgias.  Skin: Negative for color change and pallor.  Neurological: Negative for dizziness, tremors, seizures, syncope, facial asymmetry, speech difficulty, weakness, light-headedness, numbness and headaches.  Psychiatric/Behavioral: Negative for confusion. The patient is nervous/anxious.   All other systems reviewed and are negative.        Observations/Objective: No vital signs or physical exam, this was a telephone or virtual health encounter.  Pt  alert and oriented, answers all questions appropriately, and able to speak in full sentences.    Assessment and Plan: Diagnoses and all orders for this visit:  End stage  renal disease [facility]) Dialysis on Tue, Thur, and Sat  Hypertension associated with diabetes ([place]) Doing well. No need for refills today. Continue medications as prescribed.   Type II diabetes mellitus with nephropathy (HCC) A1C 6.3 on 02/25/2019 at dialysis. No change in medications needed.   Anxiety Will taper off of Ativan and start atarax as needed for anxiety. Pt ok with this plan. Will increase Lexapro to 20 mg daily due to increasing anxiety. Will recheck in 1 month.  -     escitalopram (LEXAPRO) 20 MG tablet; Take 1 tablet (20 mg total) by mouth daily for 30 days. -     hydrOXYzine (ATARAX/VISTARIL) 10 MG tablet; Take 1 tablet (10 mg total) by mouth 3 (three) times daily as needed for up to 30 days.  COPD with acute exacerbation ([place]) Due to increasing SHOB, cough, sputum production, and sputum viscosity will treat for acute COPD exacerbation. Report any new or worsening symptoms. Medications as prescribed.  -     doxycycline (VIBRA-TABS) 100 MG tablet; Take 1 tablet (100 mg total) by mouth 2 (two) times daily for 7 days. 1 po bid -     predniSONE (DELTASONE) 20 MG tablet; 2 po at sametime daily for 5 days     Follow Up Instructions: Return in about 1 month (around 04/18/2019), or if symptoms worsen or fail to improve, for GAD, HTN, DM.    I discussed the assessment and treatment plan with the patient. The patient was provided an opportunity to ask questions and all were answered. The patient agreed with the plan and demonstrated an understanding of the instructions.   The patient was advised to call back or seek an in-person evaluation if the symptoms worsen or if the condition fails to improve as anticipated.  The above assessment and management plan was discussed with the patient. The patient verbalized understanding of and has agreed to the management plan. Patient is aware to call the clinic if symptoms persist or worsen. Patient is aware when to return to the clinic for a  follow-up visit. Patient educated on when it is appropriate to go to the emergency department.    I provided 25 minutes of non-face-to-face time during this encounter. The call started at 0835. The call ended at 0900.   Monia Pouch, FNP-C [facility] [address] [place], [place] [address] [phone]

## 2019-03-20 DIAGNOSIS — D631 Anemia in chronic kidney disease: Secondary | ICD-10-CM | POA: Diagnosis not present

## 2019-03-20 DIAGNOSIS — D509 Iron deficiency anemia, unspecified: Secondary | ICD-10-CM | POA: Diagnosis not present

## 2019-03-20 DIAGNOSIS — Z992 Dependence on renal dialysis: Secondary | ICD-10-CM | POA: Diagnosis not present

## 2019-03-20 DIAGNOSIS — N2581 Secondary hyperparathyroidism of renal origin: Secondary | ICD-10-CM | POA: Diagnosis not present

## 2019-03-20 DIAGNOSIS — N186 End stage renal disease: Secondary | ICD-10-CM | POA: Diagnosis not present

## 2019-03-22 DIAGNOSIS — N2581 Secondary hyperparathyroidism of renal origin: Secondary | ICD-10-CM | POA: Diagnosis not present

## 2019-03-22 DIAGNOSIS — Z992 Dependence on renal dialysis: Secondary | ICD-10-CM | POA: Diagnosis not present

## 2019-03-22 DIAGNOSIS — N186 End stage renal disease: Secondary | ICD-10-CM | POA: Diagnosis not present

## 2019-03-22 DIAGNOSIS — D631 Anemia in chronic kidney disease: Secondary | ICD-10-CM | POA: Diagnosis not present

## 2019-03-22 DIAGNOSIS — D509 Iron deficiency anemia, unspecified: Secondary | ICD-10-CM | POA: Diagnosis not present

## 2019-03-25 DIAGNOSIS — Z992 Dependence on renal dialysis: Secondary | ICD-10-CM | POA: Diagnosis not present

## 2019-03-25 DIAGNOSIS — D509 Iron deficiency anemia, unspecified: Secondary | ICD-10-CM | POA: Diagnosis not present

## 2019-03-25 DIAGNOSIS — N186 End stage renal disease: Secondary | ICD-10-CM | POA: Diagnosis not present

## 2019-03-25 DIAGNOSIS — N2581 Secondary hyperparathyroidism of renal origin: Secondary | ICD-10-CM | POA: Diagnosis not present

## 2019-03-25 DIAGNOSIS — D631 Anemia in chronic kidney disease: Secondary | ICD-10-CM | POA: Diagnosis not present

## 2019-03-27 DIAGNOSIS — D631 Anemia in chronic kidney disease: Secondary | ICD-10-CM | POA: Diagnosis not present

## 2019-03-27 DIAGNOSIS — D509 Iron deficiency anemia, unspecified: Secondary | ICD-10-CM | POA: Diagnosis not present

## 2019-03-27 DIAGNOSIS — N186 End stage renal disease: Secondary | ICD-10-CM | POA: Diagnosis not present

## 2019-03-27 DIAGNOSIS — Z992 Dependence on renal dialysis: Secondary | ICD-10-CM | POA: Diagnosis not present

## 2019-03-27 DIAGNOSIS — N2581 Secondary hyperparathyroidism of renal origin: Secondary | ICD-10-CM | POA: Diagnosis not present

## 2019-03-29 DIAGNOSIS — N2581 Secondary hyperparathyroidism of renal origin: Secondary | ICD-10-CM | POA: Diagnosis not present

## 2019-03-29 DIAGNOSIS — D631 Anemia in chronic kidney disease: Secondary | ICD-10-CM | POA: Diagnosis not present

## 2019-03-29 DIAGNOSIS — Z992 Dependence on renal dialysis: Secondary | ICD-10-CM | POA: Diagnosis not present

## 2019-03-29 DIAGNOSIS — N186 End stage renal disease: Secondary | ICD-10-CM | POA: Diagnosis not present

## 2019-03-29 DIAGNOSIS — D509 Iron deficiency anemia, unspecified: Secondary | ICD-10-CM | POA: Diagnosis not present

## 2019-04-01 DIAGNOSIS — N186 End stage renal disease: Secondary | ICD-10-CM | POA: Diagnosis not present

## 2019-04-01 DIAGNOSIS — D509 Iron deficiency anemia, unspecified: Secondary | ICD-10-CM | POA: Diagnosis not present

## 2019-04-01 DIAGNOSIS — N2581 Secondary hyperparathyroidism of renal origin: Secondary | ICD-10-CM | POA: Diagnosis not present

## 2019-04-01 DIAGNOSIS — Z992 Dependence on renal dialysis: Secondary | ICD-10-CM | POA: Diagnosis not present

## 2019-04-01 DIAGNOSIS — D631 Anemia in chronic kidney disease: Secondary | ICD-10-CM | POA: Diagnosis not present

## 2019-04-03 DIAGNOSIS — D631 Anemia in chronic kidney disease: Secondary | ICD-10-CM | POA: Diagnosis not present

## 2019-04-03 DIAGNOSIS — N2581 Secondary hyperparathyroidism of renal origin: Secondary | ICD-10-CM | POA: Diagnosis not present

## 2019-04-03 DIAGNOSIS — D509 Iron deficiency anemia, unspecified: Secondary | ICD-10-CM | POA: Diagnosis not present

## 2019-04-03 DIAGNOSIS — N186 End stage renal disease: Secondary | ICD-10-CM | POA: Diagnosis not present

## 2019-04-03 DIAGNOSIS — Z992 Dependence on renal dialysis: Secondary | ICD-10-CM | POA: Diagnosis not present

## 2019-04-05 ENCOUNTER — Encounter (HOSPITAL_COMMUNITY): Payer: Self-pay

## 2019-04-05 ENCOUNTER — Emergency Department (HOSPITAL_COMMUNITY): Payer: Medicare Other

## 2019-04-05 ENCOUNTER — Emergency Department (HOSPITAL_COMMUNITY)
Admission: EM | Admit: 2019-04-05 | Discharge: 2019-04-05 | Disposition: A | Discharge status: Home / Self Care | Payer: Medicare Other | Attending: Emergency Medicine | Admitting: Emergency Medicine

## 2019-04-05 ENCOUNTER — Other Ambulatory Visit: Payer: Self-pay

## 2019-04-05 DIAGNOSIS — S82112A Displaced fracture of left tibial spine, initial encounter for closed fracture: Secondary | ICD-10-CM | POA: Diagnosis not present

## 2019-04-05 DIAGNOSIS — S80912A Unspecified superficial injury of left knee, initial encounter: Secondary | ICD-10-CM | POA: Diagnosis not present

## 2019-04-05 DIAGNOSIS — Z79899 Other long term (current) drug therapy: Secondary | ICD-10-CM | POA: Insufficient documentation

## 2019-04-05 DIAGNOSIS — Y9389 Activity, other specified: Secondary | ICD-10-CM | POA: Diagnosis not present

## 2019-04-05 DIAGNOSIS — S79912A Unspecified injury of left hip, initial encounter: Secondary | ICD-10-CM | POA: Diagnosis not present

## 2019-04-05 DIAGNOSIS — W19XXXA Unspecified fall, initial encounter: Secondary | ICD-10-CM | POA: Diagnosis not present

## 2019-04-05 DIAGNOSIS — I5032 Chronic diastolic (congestive) heart failure: Secondary | ICD-10-CM | POA: Insufficient documentation

## 2019-04-05 DIAGNOSIS — Z794 Long term (current) use of insulin: Secondary | ICD-10-CM | POA: Diagnosis not present

## 2019-04-05 DIAGNOSIS — S82115A Nondisplaced fracture of left tibial spine, initial encounter for closed fracture: Secondary | ICD-10-CM

## 2019-04-05 DIAGNOSIS — S82192A Other fracture of upper end of left tibia, initial encounter for closed fracture: Secondary | ICD-10-CM | POA: Insufficient documentation

## 2019-04-05 DIAGNOSIS — N186 End stage renal disease: Secondary | ICD-10-CM | POA: Diagnosis not present

## 2019-04-05 DIAGNOSIS — Z23 Encounter for immunization: Secondary | ICD-10-CM | POA: Diagnosis not present

## 2019-04-05 DIAGNOSIS — Y999 Unspecified external cause status: Secondary | ICD-10-CM | POA: Diagnosis not present

## 2019-04-05 DIAGNOSIS — M25562 Pain in left knee: Secondary | ICD-10-CM | POA: Diagnosis not present

## 2019-04-05 DIAGNOSIS — W1789XA Other fall from one level to another, initial encounter: Secondary | ICD-10-CM | POA: Insufficient documentation

## 2019-04-05 DIAGNOSIS — I132 Hypertensive heart and chronic kidney disease with heart failure and with stage 5 chronic kidney disease, or end stage renal disease: Secondary | ICD-10-CM | POA: Insufficient documentation

## 2019-04-05 DIAGNOSIS — Y92003 Bedroom of unspecified non-institutional (private) residence as the place of occurrence of the external cause: Secondary | ICD-10-CM | POA: Insufficient documentation

## 2019-04-05 DIAGNOSIS — D509 Iron deficiency anemia, unspecified: Secondary | ICD-10-CM | POA: Diagnosis not present

## 2019-04-05 DIAGNOSIS — N2581 Secondary hyperparathyroidism of renal origin: Secondary | ICD-10-CM | POA: Diagnosis not present

## 2019-04-05 DIAGNOSIS — E1140 Type 2 diabetes mellitus with diabetic neuropathy, unspecified: Secondary | ICD-10-CM | POA: Diagnosis not present

## 2019-04-05 DIAGNOSIS — M25462 Effusion, left knee: Secondary | ICD-10-CM | POA: Diagnosis not present

## 2019-04-05 DIAGNOSIS — E1122 Type 2 diabetes mellitus with diabetic chronic kidney disease: Secondary | ICD-10-CM | POA: Insufficient documentation

## 2019-04-05 DIAGNOSIS — S8992XA Unspecified injury of left lower leg, initial encounter: Secondary | ICD-10-CM | POA: Diagnosis present

## 2019-04-05 DIAGNOSIS — D631 Anemia in chronic kidney disease: Secondary | ICD-10-CM | POA: Diagnosis not present

## 2019-04-05 DIAGNOSIS — Z992 Dependence on renal dialysis: Secondary | ICD-10-CM | POA: Diagnosis not present

## 2019-04-05 DIAGNOSIS — Z7982 Long term (current) use of aspirin: Secondary | ICD-10-CM | POA: Insufficient documentation

## 2019-04-05 DIAGNOSIS — R52 Pain, unspecified: Secondary | ICD-10-CM | POA: Diagnosis not present

## 2019-04-05 DIAGNOSIS — R609 Edema, unspecified: Secondary | ICD-10-CM | POA: Diagnosis not present

## 2019-04-05 MED ORDER — HYDROCODONE-ACETAMINOPHEN 5-325 MG PO TABS
1.0000 | ORAL_TABLET | Freq: Once | ORAL | Status: AC
  Administered 2019-04-05: 1 via ORAL
  Filled 2019-04-05: qty 1

## 2019-04-05 MED ORDER — HYDROCODONE-ACETAMINOPHEN 5-325 MG PO TABS: 1.0000 | ORAL_TABLET | Freq: Four times a day (QID) | ORAL | 0 refills | Status: DC | PRN

## 2019-04-05 NOTE — ED Provider Notes (Signed)
[facility] EMERGENCY DEPARTMENT Provider Note   CSN: [id number] Arrival date & time: 04/05/19  4401    History   Chief Complaint Chief Complaint  Patient presents with  . Fall    knee injury    HPI [name] is a 68 y.o. female.     Patient presents to the emergency department for evaluation of left knee injury.  Patient reports that she was standing on her bed last night around 10 PM trying to reach something on the ceiling when she lost her balance.  She fell injuring her left knee.  She has been having difficulty putting any weight on the knee since this occurred.  She reports that it swelled up immediately and is still swollen and painful.  She did not hit her head or lose consciousness.  She denies neck and back pain.  No upper extremity injury.     Past Medical History:  Diagnosis Date  . Acute on chronic diastolic CHF (congestive heart failure) ([place]) 03/05/2015   Grade 2. EF 60-65%  . Anemia    hx of  . Anxiety   . Arthritis   . CAD (coronary artery disease)    s/p Stenting in 2005;  [place] 7/09: Vigorous LV function, 2-3+ MR, RI 40%, RI stent patent, proximal-mid RCA 40-50%;  [place] 7/09:  EF 55-65%, trivial MR;  [place] 11/18/12: EF 66%, normal LV wall motion, mild to moderate anterior ischemia - reviewed by [facility] and felt to rep breast atten (low risk);  Echo 6/14: mild LVH, EF 55-65%, Gr 2 DD, PASP 44, trivial eff   . Chronic kidney disease    CREATININE IS UP--GOING TO KIDNEY MD   . Chronic neck pain   . COPD (chronic obstructive pulmonary disease) ([place])   . Degenerative joint disease    left shoulder  . Diabetic neuropathy ([place])   . Diverticulosis   . Esophageal stricture   . GERD (gastroesophageal reflux disease)   . Hyperlipidemia   . Hypertension   . IDDM (insulin dependent diabetes mellitus) ([place])   . Pericardial effusion 03/06/2015   Very small per echo ; pleural nodules seen on CT chest.  . Peripheral vascular disease ([place])   . Stroke [facility])    "mini-  years ago"  . Vitamin D deficiency     Patient Active Problem List   Diagnosis Date Noted  . Lung nodule 05/01/2018  . Prolonged Q-T interval on ECG 09/06/2017  . Coronary artery disease involving native coronary artery of native heart without angina pectoris 09/06/2017  . GAD (generalized anxiety disorder) 03/04/2016  . Leg pain 01/07/2016  . Anemia in chronic kidney disease 12/10/2015  . Dependence on renal dialysis ([place]) 12/10/2015  . End stage renal disease ([place]) 12/10/2015  . Osteopathy in diseases classified elsewhere, unspecified site 12/10/2015  . Constipation 10/25/2015  . Chronic obstructive pulmonary disease ([place]) 03/06/2015  . BMI 30.0-30.9,adult 03/06/2015  . Pericardial effusion 03/06/2015  . Chronic diastolic CHF (congestive heart failure) ([place]) 03/05/2015  . PAD (peripheral artery disease) ([place]) 04/21/2014  . Dyspnea 09/29/2013  . Obesity 12/05/2011  . Diverticulosis 03/29/2011  . Diabetic neuropathy ([place]) 03/29/2011  . Esophageal stricture 03/29/2011  . Vitamin D deficiency 03/29/2011  . Degenerative joint disease of left shoulder 03/29/2011  . Neck pain 03/29/2011  . Type II diabetes mellitus with nephropathy ([place]) 04/19/2009  . Hyperlipidemia associated with type 2 diabetes mellitus ([place]) 04/19/2009  . Hypertension associated with diabetes ([place]) 04/19/2009  . Coronary atherosclerosis 04/19/2009  Past Surgical History:  Procedure Laterality Date  . A/V FISTULAGRAM N/A 08/10/2017   Procedure: A/V Fistulagram - Right Arm;  Surgeon: Elam Dutch, MD;  Location: [place] CV LAB;  Service: Cardiovascular;  Laterality: N/A;  . ABDOMINAL HYSTERECTOMY    . AV FISTULA PLACEMENT Right 07/20/2015   Procedure: RADIAL CEPHALIC ARTERIOVENOUS FISTULA CREATION RIGHT ARM;  Surgeon: Angelia Mould, MD;  Location: [place];  Service: Vascular;  Laterality: Right;  . AV FISTULA PLACEMENT Right 11/26/2015   Procedure:  Creation of RIGHT BRACHIO-CEPHALIC  arteriovenous fistula;  Surgeon: Angelia Mould, MD;  Location: [place];  Service: Vascular;  Laterality: Right;  . BACK SURGERY     2007 & 2013  . BLADDER SURGERY     Tack  . CARDIAC CATHETERIZATION  2003, 2005, 2009   1 stent  . cardiac stents    . CATARACT EXTRACTION W/PHACO Right 05/04/2014   Procedure: CATARACT EXTRACTION PHACO AND INTRAOCULAR LENS PLACEMENT (IOC);  Surgeon: Williams Che, MD;  Location: [facility];  Service: Ophthalmology;  Laterality: Right;  CDE 1.47  . CATARACT EXTRACTION W/PHACO Left 07/20/2014   Procedure: CATARACT EXTRACTION WITH PHACO AND INTRAOCULAR LENS PLACEMENT LEFT EYE CDE=1.79;  Surgeon: Williams Che, MD;  Location: [facility];  Service: Ophthalmology;  Laterality: Left;  . CERVICAL SPINE SURGERY  2012   8 screws  . CYSTOSCOPY WITH INJECTION N/A 06/03/2013   Procedure: MACROPLASTIQUE  INJECTION ;  Surgeon: Malka So, MD;  Location: [facility];  Service: Urology;  Laterality: N/A;  . FISTULOGRAM Right 10/20/2016   Procedure: FISTULOGRAM WITH POSSIBLE INTERVENTION;  Surgeon: [name], MD;  Location: [facility];  Service: Vascular;  Laterality: Right;  . INSERTION OF DIALYSIS CATHETER N/A 11/26/2015   Procedure: INSERTION OF DIALYSIS CATHETER;  Surgeon: Angelia Mould, MD;  Location: [place];  Service: Vascular;  Laterality: N/A;  . LAMINECTOMY  12/29/2011  . LUMBAR LAMINECTOMY/DECOMPRESSION MICRODISCECTOMY  12/29/2011   Procedure: LUMBAR LAMINECTOMY/DECOMPRESSION MICRODISCECTOMY;  Surgeon: Floyce Stakes, MD;  Location: [place] NEURO ORS;  Service: Neurosurgery;  Laterality: N/A;  Lumbar Two through Lumbar Five Laminectomies/Cellsaver  . PARTIAL HYSTERECTOMY    . PERIPHERAL VASCULAR CATHETERIZATION N/A 11/22/2015   Procedure: Fistulagram;  Surgeon: Angelia Mould, MD;  Location: [place] CV LAB;  Service: Cardiovascular;  Laterality: N/A;  . PERIPHERAL VASCULAR INTERVENTION  08/10/2017   Procedure: PERIPHERAL VASCULAR INTERVENTION;  Surgeon:  Elam Dutch, MD;  Location: [place] CV LAB;  Service: Cardiovascular;;  . SHOULDER SURGERY Right    Rotator cuff     OB History   No obstetric history on file.      Home Medications    Prior to Admission medications   Medication Sig Start Date End Date Taking? Authorizing Provider  albuterol (PROVENTIL HFA;VENTOLIN HFA) 108 (90 Base) MCG/ACT inhaler Inhale 2 puffs into the lungs every 6 (six) hours as needed for wheezing or shortness of breath. 01/28/18   Eustaquio Maize, MD  albuterol (PROVENTIL) (2.5 MG/3ML) 0.083% nebulizer solution Take 3 mLs (2.5 mg total) by nebulization every 6 (six) hours as needed for wheezing or shortness of breath. 11/13/15   Mesner, Corene Cornea, MD  aspirin 81 MG EC tablet Take 81 mg by mouth daily.      [provider]  atenolol (TENORMIN) 50 MG tablet Take 1 tablet (50 mg total) by mouth 2 (two) times daily. 12/13/18   Eustaquio Maize, MD  benzonatate (TESSALON) 200 MG capsule Take 1  capsule (200 mg total) by mouth 2 (two) times daily as needed for cough. 12/30/18   Baruch Gouty, FNP  budesonide-formoterol (SYMBICORT) 160-4.5 MCG/ACT inhaler Inhale 2 puffs into the lungs 2 (two) times daily as needed for up to 30 days. 12/30/18 01/29/19  Baruch Gouty, FNP  calcium acetate (PHOSLO) 667 MG capsule Take [phone] mg by mouth See admin instructions. Take 2 capsules with meals 3 times daily and 1 capsule with snacks    [provider]  Carboxymethylcellulose Sodium (THERATEARS OP) Place 2 drops into both eyes as needed (for dry eyes). Reported on 12/09/2015    [provider]  docusate sodium (COLACE) 100 MG capsule Take 100 mg by mouth 2 (two) times daily as needed for mild constipation.    [provider]  escitalopram (LEXAPRO) 20 MG tablet Take 1 tablet (20 mg total) by mouth daily for 30 days. 03/19/19 04/18/19  Baruch Gouty, FNP  glucose blood test strip Test 4 times daily 10/08/15   Eustaquio Maize, MD  hydrALAZINE  (APRESOLINE) 50 MG tablet Take 1 tablet (50 mg total) by mouth 2 (two) times daily. 12/09/15   Eustaquio Maize, MD  hydrOXYzine (ATARAX/VISTARIL) 10 MG tablet Take 1 tablet (10 mg total) by mouth 3 (three) times daily as needed for up to 30 days. 03/19/19 04/18/19  Baruch Gouty, FNP  Insulin Glargine (LANTUS SOLOSTAR) 100 UNIT/ML Solostar Pen INJECT 15 TO 25 UNITS INTO THE SKIN DAILY AT 10 PM. 12/13/18   Eustaquio Maize, MD  NIFEdipine (PROCARDIA XL/ADALAT-CC) 90 MG 24 hr tablet Take 90 mg by mouth every evening.     [provider]  nitroGLYCERIN (NITROSTAT) 0.4 MG SL tablet Place 1 tablet (0.4 mg total) under the tongue every 5 (five) minutes as needed for chest pain. 05/25/15   Minus Breeding, MD  pravastatin (PRAVACHOL) 20 MG tablet TAKE 1 TABLET BY MOUTH ONCE DAILY. 01/14/19   Baruch Gouty, FNP  predniSONE (DELTASONE) 20 MG tablet 2 po at sametime daily for 5 days 03/19/19   Baruch Gouty, FNP  torsemide (DEMADEX) 20 MG tablet 20 mg. Take two tablets by mouth once daily    [provider]    Family History Family History  Problem Relation Age of Onset  . Heart disease Other   . Hypertension Father   . Cancer Father   . Hypertension Daughter   . Pancreatitis Brother   . Healthy Daughter     Social History Social History   Tobacco Use  . Smoking status: Former Smoker    Packs/day: 1.00    Years: 30.00    Pack years: 30.00    Last attempt to quit: 12/04/2001    Years since quitting: 17.3  . Smokeless tobacco: Never Used  Substance Use Topics  . Alcohol use: No    Alcohol/week: 6.0 standard drinks    Types: 6 Cans of beer per week  . Drug use: No     Allergies   Azithromycin; Ace inhibitors; Clopidogrel bisulfate; Codeine; Lisinopril; and Penicillins   Review of Systems Review of Systems  Musculoskeletal: Positive for arthralgias.  All other systems reviewed and are negative.    Physical Exam Updated Vital Signs There were no vitals taken for this  visit.  Physical Exam Vitals signs and nursing note reviewed.  Constitutional:      General: She is not in acute distress.    Appearance: Normal appearance. She is well-developed.  HENT:  Head: Normocephalic and atraumatic.     Right Ear: Hearing normal.     Left Ear: Hearing normal.     Nose: Nose normal.  Eyes:     Conjunctiva/sclera: Conjunctivae normal.     Pupils: Pupils are equal, round, and reactive to light.  Neck:     Musculoskeletal: Normal range of motion and neck supple.  Cardiovascular:     Rate and Rhythm: Regular rhythm.     Heart sounds: S1 normal and S2 normal. No murmur. No friction rub. No gallop.   Pulmonary:     Effort: Pulmonary effort is normal. No respiratory distress.     Breath sounds: Normal breath sounds.  Chest:     Chest wall: No tenderness.  Abdominal:     General: Bowel sounds are normal.     Palpations: Abdomen is soft.     Tenderness: There is no abdominal tenderness. There is no guarding or rebound. Negative signs include Murphy's sign and McBurney's sign.     Hernia: No hernia is present.  Musculoskeletal:     Left hip: She exhibits normal range of motion and no deformity.     Left knee: She exhibits decreased range of motion and swelling. She exhibits no deformity and no erythema. Tenderness found.  Skin:    General: Skin is warm and dry.     Findings: Abrasion present. No rash.       Neurological:     Mental Status: She is alert and oriented to person, place, and time.     GCS: GCS eye subscore is 4. GCS verbal subscore is 5. GCS motor subscore is 6.     Cranial Nerves: No cranial nerve deficit.     Sensory: No sensory deficit.     Coordination: Coordination normal.  Psychiatric:        Speech: Speech normal.        Behavior: Behavior normal.        Thought Content: Thought content normal.      ED Treatments / Results  Labs (all labs ordered are listed, but only abnormal results are displayed) Labs Reviewed - No data to  display  EKG None  Radiology No results found.  Procedures Procedures (including critical care time)  Medications Ordered in ED Medications - No data to display   Initial Impression / Assessment and Plan / ED Course  I have reviewed the triage vital signs and the nursing notes.  Pertinent labs & imaging results that were available during my care of the patient were reviewed by me and considered in my medical decision making (see chart for details).        Patient presents to the ER for evaluation after a fall.  Patient complaining of left knee pain.  She does have some swelling, tenderness and decreased range of motion of the left knee after the injury.  X-ray is indeterminate.  Will order CT scan.  Remainder of examination unremarkable.  No concern for injury elsewhere.  Will sign out to oncoming ER physician to follow-up on CT scan.  Final Clinical Impressions(s) / ED Diagnoses   Final diagnoses:  None    ED Discharge Orders    None       Pollina, Gwenyth Allegra, MD 04/05/19 [phone]

## 2019-04-05 NOTE — ED Triage Notes (Addendum)
Pt was standing on her bed trying to get something off the ceiling when she fell.  Pt c/o pain and swelling to left knee with small abrasion present.  Pt also c/o left hip pain

## 2019-04-05 NOTE — ED Provider Notes (Signed)
  Physical Exam  BP (!) 113/97 (BP Location: Left Arm)   Pulse 86   Temp 98.7 F (37.1 C) (Oral)   Resp 19   Ht 5\' 3"  (1.6 m)   Wt 79.4 kg   SpO2 98%   BMI 31.00 kg/m   Physical Exam  ED Course/Procedures     Procedures  MDM  Received patient in signout.  CT scan done and shows tibial spine fracture.  Immobilized with Ortho follow-up.  Discharge home.  Hopefully patient will be able to make her dialysis at 10:00 today.       Davonna Belling, MD 04/05/19 [phone]

## 2019-04-08 DIAGNOSIS — D631 Anemia in chronic kidney disease: Secondary | ICD-10-CM | POA: Diagnosis not present

## 2019-04-08 DIAGNOSIS — Z992 Dependence on renal dialysis: Secondary | ICD-10-CM | POA: Diagnosis not present

## 2019-04-08 DIAGNOSIS — N186 End stage renal disease: Secondary | ICD-10-CM | POA: Diagnosis not present

## 2019-04-08 DIAGNOSIS — Z23 Encounter for immunization: Secondary | ICD-10-CM | POA: Diagnosis not present

## 2019-04-08 DIAGNOSIS — N2581 Secondary hyperparathyroidism of renal origin: Secondary | ICD-10-CM | POA: Diagnosis not present

## 2019-04-08 DIAGNOSIS — D509 Iron deficiency anemia, unspecified: Secondary | ICD-10-CM | POA: Diagnosis not present

## 2019-04-09 DIAGNOSIS — S82142A Displaced bicondylar fracture of left tibia, initial encounter for closed fracture: Secondary | ICD-10-CM | POA: Diagnosis not present

## 2019-04-10 DIAGNOSIS — D631 Anemia in chronic kidney disease: Secondary | ICD-10-CM | POA: Diagnosis not present

## 2019-04-10 DIAGNOSIS — D509 Iron deficiency anemia, unspecified: Secondary | ICD-10-CM | POA: Diagnosis not present

## 2019-04-10 DIAGNOSIS — Z992 Dependence on renal dialysis: Secondary | ICD-10-CM | POA: Diagnosis not present

## 2019-04-10 DIAGNOSIS — Z23 Encounter for immunization: Secondary | ICD-10-CM | POA: Diagnosis not present

## 2019-04-10 DIAGNOSIS — N186 End stage renal disease: Secondary | ICD-10-CM | POA: Diagnosis not present

## 2019-04-10 DIAGNOSIS — N2581 Secondary hyperparathyroidism of renal origin: Secondary | ICD-10-CM | POA: Diagnosis not present

## 2019-04-12 DIAGNOSIS — N186 End stage renal disease: Secondary | ICD-10-CM | POA: Diagnosis not present

## 2019-04-12 DIAGNOSIS — D509 Iron deficiency anemia, unspecified: Secondary | ICD-10-CM | POA: Diagnosis not present

## 2019-04-12 DIAGNOSIS — Z992 Dependence on renal dialysis: Secondary | ICD-10-CM | POA: Diagnosis not present

## 2019-04-12 DIAGNOSIS — Z23 Encounter for immunization: Secondary | ICD-10-CM | POA: Diagnosis not present

## 2019-04-12 DIAGNOSIS — N2581 Secondary hyperparathyroidism of renal origin: Secondary | ICD-10-CM | POA: Diagnosis not present

## 2019-04-12 DIAGNOSIS — D631 Anemia in chronic kidney disease: Secondary | ICD-10-CM | POA: Diagnosis not present

## 2019-04-15 DIAGNOSIS — Z23 Encounter for immunization: Secondary | ICD-10-CM | POA: Diagnosis not present

## 2019-04-15 DIAGNOSIS — D631 Anemia in chronic kidney disease: Secondary | ICD-10-CM | POA: Diagnosis not present

## 2019-04-15 DIAGNOSIS — D509 Iron deficiency anemia, unspecified: Secondary | ICD-10-CM | POA: Diagnosis not present

## 2019-04-15 DIAGNOSIS — Z992 Dependence on renal dialysis: Secondary | ICD-10-CM | POA: Diagnosis not present

## 2019-04-15 DIAGNOSIS — N186 End stage renal disease: Secondary | ICD-10-CM | POA: Diagnosis not present

## 2019-04-15 DIAGNOSIS — N2581 Secondary hyperparathyroidism of renal origin: Secondary | ICD-10-CM | POA: Diagnosis not present

## 2019-04-17 DIAGNOSIS — Z23 Encounter for immunization: Secondary | ICD-10-CM | POA: Diagnosis not present

## 2019-04-17 DIAGNOSIS — D509 Iron deficiency anemia, unspecified: Secondary | ICD-10-CM | POA: Diagnosis not present

## 2019-04-17 DIAGNOSIS — D631 Anemia in chronic kidney disease: Secondary | ICD-10-CM | POA: Diagnosis not present

## 2019-04-17 DIAGNOSIS — N2581 Secondary hyperparathyroidism of renal origin: Secondary | ICD-10-CM | POA: Diagnosis not present

## 2019-04-17 DIAGNOSIS — Z992 Dependence on renal dialysis: Secondary | ICD-10-CM | POA: Diagnosis not present

## 2019-04-17 DIAGNOSIS — N186 End stage renal disease: Secondary | ICD-10-CM | POA: Diagnosis not present

## 2019-04-19 DIAGNOSIS — Z992 Dependence on renal dialysis: Secondary | ICD-10-CM | POA: Diagnosis not present

## 2019-04-19 DIAGNOSIS — Z23 Encounter for immunization: Secondary | ICD-10-CM | POA: Diagnosis not present

## 2019-04-19 DIAGNOSIS — D509 Iron deficiency anemia, unspecified: Secondary | ICD-10-CM | POA: Diagnosis not present

## 2019-04-19 DIAGNOSIS — D631 Anemia in chronic kidney disease: Secondary | ICD-10-CM | POA: Diagnosis not present

## 2019-04-19 DIAGNOSIS — N2581 Secondary hyperparathyroidism of renal origin: Secondary | ICD-10-CM | POA: Diagnosis not present

## 2019-04-19 DIAGNOSIS — N186 End stage renal disease: Secondary | ICD-10-CM | POA: Diagnosis not present

## 2019-04-21 ENCOUNTER — Ambulatory Visit (INDEPENDENT_AMBULATORY_CARE_PROVIDER_SITE_OTHER): Payer: Medicare Other | Admitting: Family Medicine

## 2019-04-21 ENCOUNTER — Encounter: Payer: Self-pay | Admitting: Family Medicine

## 2019-04-21 ENCOUNTER — Other Ambulatory Visit: Payer: Self-pay

## 2019-04-21 DIAGNOSIS — I5032 Chronic diastolic (congestive) heart failure: Secondary | ICD-10-CM | POA: Diagnosis not present

## 2019-04-21 DIAGNOSIS — N186 End stage renal disease: Secondary | ICD-10-CM

## 2019-04-21 DIAGNOSIS — E785 Hyperlipidemia, unspecified: Secondary | ICD-10-CM | POA: Diagnosis not present

## 2019-04-21 DIAGNOSIS — I152 Hypertension secondary to endocrine disorders: Secondary | ICD-10-CM

## 2019-04-21 DIAGNOSIS — J449 Chronic obstructive pulmonary disease, unspecified: Secondary | ICD-10-CM

## 2019-04-21 DIAGNOSIS — E1121 Type 2 diabetes mellitus with diabetic nephropathy: Secondary | ICD-10-CM

## 2019-04-21 DIAGNOSIS — E1159 Type 2 diabetes mellitus with other circulatory complications: Secondary | ICD-10-CM

## 2019-04-21 DIAGNOSIS — E1169 Type 2 diabetes mellitus with other specified complication: Secondary | ICD-10-CM

## 2019-04-21 DIAGNOSIS — I10 Essential (primary) hypertension: Secondary | ICD-10-CM | POA: Diagnosis not present

## 2019-04-21 MED ORDER — GUAIFENESIN ER 1200 MG PO TB12: 1.0000 | ORAL_TABLET | Freq: Two times a day (BID) | ORAL | 6 refills | Status: AC

## 2019-04-21 MED ORDER — ALBUTEROL SULFATE (2.5 MG/3ML) 0.083% IN NEBU: 2.5000 mg | INHALATION_SOLUTION | Freq: Four times a day (QID) | RESPIRATORY_TRACT | 12 refills | Status: DC | PRN

## 2019-04-21 MED ORDER — HYDRALAZINE HCL 50 MG PO TABS: 50.0000 mg | ORAL_TABLET | Freq: Two times a day (BID) | ORAL | 0 refills | Status: DC

## 2019-04-21 MED ORDER — ALBUTEROL SULFATE HFA 108 (90 BASE) MCG/ACT IN AERS: 2.0000 | INHALATION_SPRAY | Freq: Four times a day (QID) | RESPIRATORY_TRACT | 2 refills | Status: DC | PRN

## 2019-04-21 MED ORDER — ATENOLOL 50 MG PO TABS: 50.0000 mg | ORAL_TABLET | Freq: Two times a day (BID) | ORAL | 4 refills | Status: DC

## 2019-04-21 MED ORDER — PRAVASTATIN SODIUM 20 MG PO TABS: 20.0000 mg | ORAL_TABLET | Freq: Every day | ORAL | 4 refills | Status: DC

## 2019-04-21 MED ORDER — BUDESONIDE-FORMOTEROL FUMARATE 160-4.5 MCG/ACT IN AERO: 2.0000 | INHALATION_SPRAY | Freq: Two times a day (BID) | RESPIRATORY_TRACT | 3 refills | Status: DC | PRN

## 2019-04-21 MED ORDER — INSULIN GLARGINE 100 UNIT/ML SOLOSTAR PEN: PEN_INJECTOR | SUBCUTANEOUS | 2 refills | Status: DC

## 2019-04-21 NOTE — Progress Notes (Signed)
Virtual Visit via telephone Note Due to COVID-19, visit is conducted virtually and was requested by patient. This visit type was conducted due to national recommendations for restrictions regarding the COVID-19 Pandemic (e.g. social distancing) in an effort to limit this patient's exposure and mitigate transmission in our community. All issues noted in this document were discussed and addressed.  A physical exam was not performed with this format.   I connected with [name] on 04/21/19 at 0930 by telephone and verified that I am speaking with the correct person using two identifiers. [name] is currently located at home and no one is currently with them during visit. The provider, Monia Pouch, FNP is located in their office at time of visit.  I discussed the limitations, risks, security and privacy concerns of performing an evaluation and management service by telephone and the availability of in person appointments. I also discussed with the patient that there may be a patient responsible charge related to this service. The patient expressed understanding and agreed to proceed.  Subjective:  Patient ID: [name], female    DOB: Mar 05, 1951, 68 y.o.   MRN: [id number]  Chief Complaint:  Medical Management of Chronic Issues; Diabetes; Hypertension; Hyperlipidemia; COPD; Chronic Kidney Disease; and Congestive Heart Failure   HPI: [name] is a 68 y.o. female presenting on 04/21/2019 for Medical Management of Chronic Issues; Diabetes; Hypertension; Hyperlipidemia; COPD; Chronic Kidney Disease; and Congestive Heart Failure   Pt reports she is doing fairly well overall. States she did have a fall and fractured her left leg on 04/05/2019. States she is being followed by orhto and has an appointment on Wednesday to determine if she needs surgery. States she is in a brace and is able to bear minimal weight. States her family has been very supportive in helping her get to and  from dialysis.  1. Hypertension associated with diabetes ([place]) Complaint with meds - Yes Checking BP at home ranging 130/80 Exercising Regularly - No Watching Salt intake - Yes Pertinent ROS:  Headache - No Chest pain - No Dyspnea - No Palpitations - No LE edema - Yes, minimal They report good compliance with medications and can restate their regimen by memory. No medication side effects.  BP Readings from Last 3 Encounters: 04/05/19 : (!) 144/68 12/30/18 : 138/84 12/13/18 : 131/66  2. Chronic diastolic CHF (congestive heart failure) (H[facility]) No increased fatigue, lower extremity swelling, shortness of breath, orthopnea, PND, or weight gain. Compliant with medications. No chest pain.   3. Chronic obstructive pulmonary disease, unspecified COPD type ([place]) States she is doing well since her last exacerbation. States she still has a lot of congestion and mucus in the mornings. She does not take an expectorant on a daily basis. No chills, fever, fatigue, or increased dyspnea on exertion.   4. Type II diabetes mellitus with nephropathy ([place]) Diabetes mellitus 2 Compliant with meds - Yes Checking CBGs? No Exercising regularly? - No Watching carbohydrate intake? - Yes Neuropathy ? - Yes Hypoglycemic events - No Pertinent ROS:  Polyuria - No Polydipsia - No Vision problems - No  5. Hyperlipidemia associated with type 2 diabetes mellitus ([place]) Compliant with medications without associated side effects. Does try to watch diet. Does not exercise on a regular basis.   6. End stage renal disease [facility]) Dialysis Tuesday, Thursday, and Saturday. Does not miss appointments. Will send copy of labs to PCP office.       Relevant past medical, surgical,  family, and social history reviewed and updated as indicated.  Allergies and medications reviewed and updated.   Past Medical History:  Diagnosis Date  . Acute on chronic diastolic CHF (congestive heart failure) ([place]) 03/05/2015   Grade  2. EF 60-65%  . Anemia    hx of  . Anxiety   . Arthritis   . CAD (coronary artery disease)    s/p Stenting in 2005;  [place] 7/09: Vigorous LV function, 2-3+ MR, RI 40%, RI stent patent, proximal-mid RCA 40-50%;  [place] 7/09:  EF 55-65%, trivial MR;  [place] 11/18/12: EF 66%, normal LV wall motion, mild to moderate anterior ischemia - reviewed by [facility] and felt to rep breast atten (low risk);  Echo 6/14: mild LVH, EF 55-65%, Gr 2 DD, PASP 44, trivial eff   . Chronic kidney disease    CREATININE IS UP--GOING TO KIDNEY MD   . Chronic neck pain   . COPD (chronic obstructive pulmonary disease) ([place])   . Degenerative joint disease    left shoulder  . Diabetic neuropathy ([place])   . Diverticulosis   . Esophageal stricture   . GERD (gastroesophageal reflux disease)   . Hyperlipidemia   . Hypertension   . IDDM (insulin dependent diabetes mellitus) ([place])   . Pericardial effusion 03/06/2015   Very small per echo ; pleural nodules seen on CT chest.  . Peripheral vascular disease ([place])   . Stroke [facility])    "mini- years ago"  . Vitamin D deficiency     Past Surgical History:  Procedure Laterality Date  . A/V FISTULAGRAM N/A 08/10/2017   Procedure: A/V Fistulagram - Right Arm;  Surgeon: Elam Dutch, MD;  Location: [place] [facility];  Service: Cardiovascular;  Laterality: N/A;  . ABDOMINAL HYSTERECTOMY    . AV FISTULA PLACEMENT Right 07/20/2015   Procedure: RADIAL CEPHALIC ARTERIOVENOUS FISTULA CREATION RIGHT ARM;  Surgeon: Angelia Mould, MD;  Location: [place];  Service: Vascular;  Laterality: Right;  . AV FISTULA PLACEMENT Right 11/26/2015   Procedure:  Creation of RIGHT BRACHIO-CEPHALIC arteriovenous fistula;  Surgeon: Angelia Mould, MD;  Location: [place];  Service: Vascular;  Laterality: Right;  . BACK SURGERY     2007 & 2013  . BLADDER SURGERY     Tack  . CARDIAC CATHETERIZATION  2003, 2005, 2009   1 stent  . cardiac stents    . CATARACT EXTRACTION W/PHACO Right 05/04/2014    Procedure: CATARACT EXTRACTION PHACO AND INTRAOCULAR LENS PLACEMENT (IOC);  Surgeon: Williams Che, MD;  Location: [facility];  Service: Ophthalmology;  Laterality: Right;  CDE 1.47  . CATARACT EXTRACTION W/PHACO Left 07/20/2014   Procedure: CATARACT EXTRACTION WITH PHACO AND INTRAOCULAR LENS PLACEMENT LEFT EYE CDE=1.79;  Surgeon: Williams Che, MD;  Location: [facility];  Service: Ophthalmology;  Laterality: Left;  . CERVICAL SPINE SURGERY  2012   8 screws  . CYSTOSCOPY WITH INJECTION N/A 06/03/2013   Procedure: MACROPLASTIQUE  INJECTION ;  Surgeon: Malka So, MD;  Location: [facility];  Service: Urology;  Laterality: N/A;  . FISTULOGRAM Right 10/20/2016   Procedure: FISTULOGRAM WITH POSSIBLE INTERVENTION;  Surgeon: [name], MD;  Location: [facility];  Service: Vascular;  Laterality: Right;  . INSERTION OF DIALYSIS CATHETER N/A 11/26/2015   Procedure: INSERTION OF DIALYSIS CATHETER;  Surgeon: Angelia Mould, MD;  Location: [place];  Service: Vascular;  Laterality: N/A;  . LAMINECTOMY  12/29/2011  . LUMBAR LAMINECTOMY/DECOMPRESSION MICRODISCECTOMY  12/29/2011   Procedure: LUMBAR  LAMINECTOMY/DECOMPRESSION MICRODISCECTOMY;  Surgeon: Floyce Stakes, MD;  Location: [place] [facility];  Service: Neurosurgery;  Laterality: N/A;  Lumbar Two through Lumbar Five Laminectomies/Cellsaver  . PARTIAL HYSTERECTOMY    . PERIPHERAL VASCULAR CATHETERIZATION N/A 11/22/2015   Procedure: Fistulagram;  Surgeon: Angelia Mould, MD;  Location: [place] [facility];  Service: Cardiovascular;  Laterality: N/A;  . PERIPHERAL VASCULAR INTERVENTION  08/10/2017   Procedure: PERIPHERAL VASCULAR INTERVENTION;  Surgeon: Elam Dutch, MD;  Location: [place] [facility];  Service: Cardiovascular;;  . SHOULDER SURGERY Right    Rotator cuff    Social History   Socioeconomic History  . Marital status: Widowed    Spouse name: Not on file  . Number of children: 2  . Years of education: GED  . Highest education level:  GED or equivalent  Occupational History  . Occupation: Disabled  Social Needs  . Financial resource strain: Not very hard  . Food insecurity:    Worry: Never true    Inability: Never true  . Transportation needs:    Medical: No    Non-medical: No  Tobacco Use  . Smoking status: Former Smoker    Packs/day: 1.00    Years: 30.00    Pack years: 30.00    Last attempt to quit: 12/04/2001    Years since quitting: 17.3  . Smokeless tobacco: Never Used  Substance and Sexual Activity  . Alcohol use: No    Alcohol/week: 6.0 standard drinks    Types: 6 Cans of beer per week  . Drug use: No  . Sexual activity: Not Currently    Birth control/protection: Surgical  Lifestyle  . Physical activity:    Days per week: 0 days    Minutes per session: 0 min  . Stress: Only a little  Relationships  . Social connections:    Talks on phone: More than three times a week    Gets together: More than three times a week    Attends religious service: More than 4 times per year    Active member of club or organization: Yes    Attends meetings of clubs or organizations: More than 4 times per year    Relationship status: Widowed  . Intimate partner violence:    Fear of current or ex partner: No    Emotionally abused: No    Physically abused: No    Forced sexual activity: No  Other Topics Concern  . Not on file  Social History Narrative   Lives in [place]    Outpatient Encounter Medications as of 04/21/2019  Medication Sig  . albuterol (PROVENTIL) (2.5 MG/3ML) 0.083% nebulizer solution Take 3 mLs (2.5 mg total) by nebulization every 6 (six) hours as needed for wheezing or shortness of breath.  [name] albuterol (VENTOLIN HFA) 108 (90 Base) MCG/ACT inhaler Inhale 2 puffs into the lungs every 6 (six) hours as needed for wheezing or shortness of breath.  [name] aspirin 81 MG EC tablet Take 81 mg by mouth daily.    [name] atenolol (TENORMIN) 50 MG tablet Take 1 tablet (50 mg total) by mouth 2 (two) times daily.  .  budesonide-formoterol (SYMBICORT) 160-4.5 MCG/ACT inhaler Inhale 2 puffs into the lungs 2 (two) times daily as needed for up to 30 days.  . calcium acetate (PHOSLO) 667 MG capsule Take 667-1,334 mg by mouth See admin instructions. Take 2 capsules with meals 3 times daily and 1 capsule with snacks  . Carboxymethylcellulose Sodium (THERATEARS OP) Place 2 drops into both  eyes as needed (for dry eyes). Reported on 12/09/2015  . escitalopram (LEXAPRO) 20 MG tablet Take 1 tablet (20 mg total) by mouth daily for 30 days.  . Guaifenesin 1200 MG TB12 Take 1 tablet (1,200 mg total) by mouth 2 (two) times a day for 30 days.  . hydrALAZINE (APRESOLINE) 50 MG tablet Take 1 tablet (50 mg total) by mouth 2 (two) times daily.  [name] HYDROcodone-acetaminophen (NORCO/VICODIN) 5-325 MG tablet Take 1-2 tablets by mouth every 6 (six) hours as needed.  . Insulin Glargine (LANTUS SOLOSTAR) 100 UNIT/ML Solostar Pen INJECT 15 TO 25 UNITS INTO THE SKIN DAILY AT 10 PM.  . NIFEdipine (PROCARDIA XL/ADALAT-CC) 90 MG 24 hr tablet Take 90 mg by mouth every evening.   . nitroGLYCERIN (NITROSTAT) 0.4 MG SL tablet Place 1 tablet (0.4 mg total) under the tongue every 5 (five) minutes as needed for chest pain.  . pravastatin (PRAVACHOL) 20 MG tablet Take 1 tablet (20 mg total) by mouth daily for 30 days.  [name] torsemide (DEMADEX) 20 MG tablet 20 mg. Take two tablets by mouth once daily  . [DISCONTINUED] albuterol (PROVENTIL HFA;VENTOLIN HFA) 108 (90 Base) MCG/ACT inhaler Inhale 2 puffs into the lungs every 6 (six) hours as needed for wheezing or shortness of breath.  . [DISCONTINUED] albuterol (PROVENTIL) (2.5 MG/3ML) 0.083% nebulizer solution Take 3 mLs (2.5 mg total) by nebulization every 6 (six) hours as needed for wheezing or shortness of breath.  . [DISCONTINUED] atenolol (TENORMIN) 50 MG tablet Take 1 tablet (50 mg total) by mouth 2 (two) times daily.  . [DISCONTINUED] benzonatate (TESSALON) 200 MG capsule Take 1 capsule (200 mg total) by  mouth 2 (two) times daily as needed for cough. (Patient not taking: Reported on 04/05/2019)  . [DISCONTINUED] budesonide-formoterol (SYMBICORT) 160-4.5 MCG/ACT inhaler Inhale 2 puffs into the lungs 2 (two) times daily as needed for up to 30 days.  . [DISCONTINUED] hydrALAZINE (APRESOLINE) 50 MG tablet Take 1 tablet (50 mg total) by mouth 2 (two) times daily.  . [DISCONTINUED] Insulin Glargine (LANTUS SOLOSTAR) 100 UNIT/ML Solostar Pen INJECT 15 TO 25 UNITS INTO THE SKIN DAILY AT 10 PM.  . [DISCONTINUED] pravastatin (PRAVACHOL) 20 MG tablet TAKE 1 TABLET BY MOUTH ONCE DAILY.  . [DISCONTINUED] predniSONE (DELTASONE) 20 MG tablet 2 po at sametime daily for 5 days (Patient not taking: Reported on 04/05/2019)   No facility-administered encounter medications on file as of 04/21/2019.     Allergies  Allergen Reactions  . Azithromycin     Prolonged QT  . Ace Inhibitors Cough  . Clopidogrel Bisulfate Other (See Comments)    REACTION: Sick, Headache, "Felt terrible"  . Codeine Other (See Comments)    REACTION: hallucinations  . Lisinopril Other (See Comments)    cough  . Penicillins Other (See Comments)    Has patient had a PCN reaction causing immediate rash, facial/tongue/throat swelling, SOB or lightheadedness with hypotension: unknown Has patient had a PCN reaction causing severe rash involving mucus membranes or skin necrosis: unknown Has patient had a PCN reaction that required hospitalization unknown Has patient had a PCN reaction occurring within the last 10 years:unknown If all of the above answers are "NO", then may proceed with Cephalosporin use.     Review of Systems  Constitutional: Positive for activity change. Negative for appetite change, chills, diaphoresis, fatigue, fever and unexpected weight change.  HENT: Positive for congestion.   Eyes: Negative for photophobia and visual disturbance.  Respiratory: Positive for cough, shortness of breath (exertional) and  wheezing. Negative  for apnea, choking, chest tightness and stridor.   Cardiovascular: Positive for leg swelling. Negative for chest pain and palpitations.  Gastrointestinal: Negative for abdominal distention, abdominal pain, anal bleeding, blood in stool, constipation, diarrhea, nausea, rectal pain and vomiting.  Endocrine: Negative for polydipsia, polyphagia and polyuria.  Musculoskeletal: Positive for myalgias. Negative for arthralgias.  Skin: Negative for color change, pallor, rash and wound.  Neurological: Negative for dizziness, tremors, seizures, syncope, facial asymmetry, speech difficulty, weakness, light-headedness, numbness and headaches.  Hematological: Negative for adenopathy. Does not bruise/bleed easily.  Psychiatric/Behavioral: Negative for confusion and decreased concentration. The patient is not nervous/anxious.   All other systems reviewed and are negative.        Observations/Objective: No vital signs or physical exam, this was a telephone or virtual health encounter.  Pt alert and oriented, answers all questions appropriately, and able to speak in full sentences.    Assessment and Plan: Trulee was seen today for medical management of chronic issues, diabetes, hypertension, hyperlipidemia, copd, chronic kidney disease and congestive heart failure.  Diagnoses and all orders for this visit:  Hypertension associated with diabetes ([place]) Well controlled. Continue current medications. Report any persistent highs or lows.  -     atenolol (TENORMIN) 50 MG tablet; Take 1 tablet (50 mg total) by mouth 2 (two) times daily. -     hydrALAZINE (APRESOLINE) 50 MG tablet; Take 1 tablet (50 mg total) by mouth 2 (two) times daily.  Chronic diastolic CHF (congestive heart failure) ([place]) Followed by cardiology. Report any 3lb weight gain in one day or 5lb weight gain in one week.   Chronic obstructive pulmonary disease, unspecified COPD type (H[facility]) Increasing congestion and mucus. Will add daily  expectorant. Report any new or worsening symptoms. Medications as prescribed.  -     Guaifenesin 1200 MG TB12; Take 1 tablet (1,200 mg total) by mouth 2 (two) times a day for 30 days. -     albuterol (VENTOLIN HFA) 108 (90 Base) MCG/ACT inhaler; Inhale 2 puffs into the lungs every 6 (six) hours as needed for wheezing or shortness of breath. -     albuterol (PROVENTIL) (2.5 MG/3ML) 0.083% nebulizer solution; Take 3 mLs (2.5 mg total) by nebulization every 6 (six) hours as needed for wheezing or shortness of breath. -     budesonide-formoterol (SYMBICORT) 160-4.5 MCG/ACT inhaler; Inhale 2 puffs into the lungs 2 (two) times daily as needed for up to 30 days.  Type II diabetes mellitus with nephropathy (HCC) Last A1C 6.3. continue current medications. Labs at next appointment. Will get labs from dialysis center.  -     Insulin Glargine (LANTUS SOLOSTAR) 100 UNIT/ML Solostar Pen; INJECT 15 TO 25 UNITS INTO THE SKIN DAILY AT 10 PM.  Hyperlipidemia associated with type 2 diabetes mellitus ([place]) Continue current medications. Will check labs at next appointment. Diet and exercise encouraged.  -     pravastatin (PRAVACHOL) 20 MG tablet; Take 1 tablet (20 mg total) by mouth daily for 30 days.  End stage renal disease ([place]) Followed by nephrology. Dialysis Tuesday, Thursday, and Saturday.     Follow Up Instructions: Return in about 3 months (around 07/22/2019), or if symptoms worsen or fail to improve, for DM, HTN, CKD, COPD.    I discussed the assessment and treatment plan with the patient. The patient was provided an opportunity to ask questions and all were answered. The patient agreed with the plan and demonstrated an understanding of the instructions.   The  patient was advised to call back or seek an in-person evaluation if the symptoms worsen or if the condition fails to improve as anticipated.  The above assessment and management plan was discussed with the patient. The patient verbalized  understanding of and has agreed to the management plan. Patient is aware to call the clinic if symptoms persist or worsen. Patient is aware when to return to the clinic for a follow-up visit. Patient educated on when it is appropriate to go to the emergency department.    I provided 25 minutes of non-face-to-face time during this encounter. The call started at 0930. The call ended at 0955. The other time was used for coordination of care.    Monia Pouch, FNP-C [facility] [address] [place], [place] [address] [phone]

## 2019-04-22 DIAGNOSIS — N186 End stage renal disease: Secondary | ICD-10-CM | POA: Diagnosis not present

## 2019-04-22 DIAGNOSIS — Z992 Dependence on renal dialysis: Secondary | ICD-10-CM | POA: Diagnosis not present

## 2019-04-22 DIAGNOSIS — Z23 Encounter for immunization: Secondary | ICD-10-CM | POA: Diagnosis not present

## 2019-04-22 DIAGNOSIS — D509 Iron deficiency anemia, unspecified: Secondary | ICD-10-CM | POA: Diagnosis not present

## 2019-04-22 DIAGNOSIS — N2581 Secondary hyperparathyroidism of renal origin: Secondary | ICD-10-CM | POA: Diagnosis not present

## 2019-04-22 DIAGNOSIS — D631 Anemia in chronic kidney disease: Secondary | ICD-10-CM | POA: Diagnosis not present

## 2019-04-23 DIAGNOSIS — S82142D Displaced bicondylar fracture of left tibia, subsequent encounter for closed fracture with routine healing: Secondary | ICD-10-CM | POA: Diagnosis not present

## 2019-04-24 DIAGNOSIS — D631 Anemia in chronic kidney disease: Secondary | ICD-10-CM | POA: Diagnosis not present

## 2019-04-24 DIAGNOSIS — N2581 Secondary hyperparathyroidism of renal origin: Secondary | ICD-10-CM | POA: Diagnosis not present

## 2019-04-24 DIAGNOSIS — Z992 Dependence on renal dialysis: Secondary | ICD-10-CM | POA: Diagnosis not present

## 2019-04-24 DIAGNOSIS — D509 Iron deficiency anemia, unspecified: Secondary | ICD-10-CM | POA: Diagnosis not present

## 2019-04-24 DIAGNOSIS — N186 End stage renal disease: Secondary | ICD-10-CM | POA: Diagnosis not present

## 2019-04-24 DIAGNOSIS — Z23 Encounter for immunization: Secondary | ICD-10-CM | POA: Diagnosis not present

## 2019-04-26 DIAGNOSIS — D509 Iron deficiency anemia, unspecified: Secondary | ICD-10-CM | POA: Diagnosis not present

## 2019-04-26 DIAGNOSIS — D631 Anemia in chronic kidney disease: Secondary | ICD-10-CM | POA: Diagnosis not present

## 2019-04-26 DIAGNOSIS — N2581 Secondary hyperparathyroidism of renal origin: Secondary | ICD-10-CM | POA: Diagnosis not present

## 2019-04-26 DIAGNOSIS — Z23 Encounter for immunization: Secondary | ICD-10-CM | POA: Diagnosis not present

## 2019-04-26 DIAGNOSIS — Z992 Dependence on renal dialysis: Secondary | ICD-10-CM | POA: Diagnosis not present

## 2019-04-26 DIAGNOSIS — N186 End stage renal disease: Secondary | ICD-10-CM | POA: Diagnosis not present

## 2019-04-29 DIAGNOSIS — Z992 Dependence on renal dialysis: Secondary | ICD-10-CM | POA: Diagnosis not present

## 2019-04-29 DIAGNOSIS — N186 End stage renal disease: Secondary | ICD-10-CM | POA: Diagnosis not present

## 2019-04-29 DIAGNOSIS — N2581 Secondary hyperparathyroidism of renal origin: Secondary | ICD-10-CM | POA: Diagnosis not present

## 2019-04-29 DIAGNOSIS — Z23 Encounter for immunization: Secondary | ICD-10-CM | POA: Diagnosis not present

## 2019-04-29 DIAGNOSIS — D631 Anemia in chronic kidney disease: Secondary | ICD-10-CM | POA: Diagnosis not present

## 2019-04-29 DIAGNOSIS — D509 Iron deficiency anemia, unspecified: Secondary | ICD-10-CM | POA: Diagnosis not present

## 2019-05-01 DIAGNOSIS — D631 Anemia in chronic kidney disease: Secondary | ICD-10-CM | POA: Diagnosis not present

## 2019-05-01 DIAGNOSIS — Z23 Encounter for immunization: Secondary | ICD-10-CM | POA: Diagnosis not present

## 2019-05-01 DIAGNOSIS — N186 End stage renal disease: Secondary | ICD-10-CM | POA: Diagnosis not present

## 2019-05-01 DIAGNOSIS — D509 Iron deficiency anemia, unspecified: Secondary | ICD-10-CM | POA: Diagnosis not present

## 2019-05-01 DIAGNOSIS — N2581 Secondary hyperparathyroidism of renal origin: Secondary | ICD-10-CM | POA: Diagnosis not present

## 2019-05-01 DIAGNOSIS — Z992 Dependence on renal dialysis: Secondary | ICD-10-CM | POA: Diagnosis not present

## 2019-05-03 DIAGNOSIS — D509 Iron deficiency anemia, unspecified: Secondary | ICD-10-CM | POA: Diagnosis not present

## 2019-05-03 DIAGNOSIS — N2581 Secondary hyperparathyroidism of renal origin: Secondary | ICD-10-CM | POA: Diagnosis not present

## 2019-05-03 DIAGNOSIS — Z23 Encounter for immunization: Secondary | ICD-10-CM | POA: Diagnosis not present

## 2019-05-03 DIAGNOSIS — N186 End stage renal disease: Secondary | ICD-10-CM | POA: Diagnosis not present

## 2019-05-03 DIAGNOSIS — D631 Anemia in chronic kidney disease: Secondary | ICD-10-CM | POA: Diagnosis not present

## 2019-05-03 DIAGNOSIS — Z992 Dependence on renal dialysis: Secondary | ICD-10-CM | POA: Diagnosis not present

## 2019-05-04 DIAGNOSIS — N186 End stage renal disease: Secondary | ICD-10-CM | POA: Diagnosis not present

## 2019-05-04 DIAGNOSIS — Z992 Dependence on renal dialysis: Secondary | ICD-10-CM | POA: Diagnosis not present

## 2019-05-06 DIAGNOSIS — Z992 Dependence on renal dialysis: Secondary | ICD-10-CM | POA: Diagnosis not present

## 2019-05-06 DIAGNOSIS — D631 Anemia in chronic kidney disease: Secondary | ICD-10-CM | POA: Diagnosis not present

## 2019-05-06 DIAGNOSIS — N2581 Secondary hyperparathyroidism of renal origin: Secondary | ICD-10-CM | POA: Diagnosis not present

## 2019-05-06 DIAGNOSIS — N186 End stage renal disease: Secondary | ICD-10-CM | POA: Diagnosis not present

## 2019-05-06 DIAGNOSIS — D509 Iron deficiency anemia, unspecified: Secondary | ICD-10-CM | POA: Diagnosis not present

## 2019-05-07 DIAGNOSIS — S82142D Displaced bicondylar fracture of left tibia, subsequent encounter for closed fracture with routine healing: Secondary | ICD-10-CM | POA: Diagnosis not present

## 2019-05-08 DIAGNOSIS — Z992 Dependence on renal dialysis: Secondary | ICD-10-CM | POA: Diagnosis not present

## 2019-05-08 DIAGNOSIS — N2581 Secondary hyperparathyroidism of renal origin: Secondary | ICD-10-CM | POA: Diagnosis not present

## 2019-05-08 DIAGNOSIS — D509 Iron deficiency anemia, unspecified: Secondary | ICD-10-CM | POA: Diagnosis not present

## 2019-05-08 DIAGNOSIS — D631 Anemia in chronic kidney disease: Secondary | ICD-10-CM | POA: Diagnosis not present

## 2019-05-08 DIAGNOSIS — N186 End stage renal disease: Secondary | ICD-10-CM | POA: Diagnosis not present

## 2019-05-10 DIAGNOSIS — D509 Iron deficiency anemia, unspecified: Secondary | ICD-10-CM | POA: Diagnosis not present

## 2019-05-10 DIAGNOSIS — N186 End stage renal disease: Secondary | ICD-10-CM | POA: Diagnosis not present

## 2019-05-10 DIAGNOSIS — Z992 Dependence on renal dialysis: Secondary | ICD-10-CM | POA: Diagnosis not present

## 2019-05-10 DIAGNOSIS — D631 Anemia in chronic kidney disease: Secondary | ICD-10-CM | POA: Diagnosis not present

## 2019-05-10 DIAGNOSIS — N2581 Secondary hyperparathyroidism of renal origin: Secondary | ICD-10-CM | POA: Diagnosis not present

## 2019-05-13 DIAGNOSIS — Z992 Dependence on renal dialysis: Secondary | ICD-10-CM | POA: Diagnosis not present

## 2019-05-13 DIAGNOSIS — N186 End stage renal disease: Secondary | ICD-10-CM | POA: Diagnosis not present

## 2019-05-13 DIAGNOSIS — D631 Anemia in chronic kidney disease: Secondary | ICD-10-CM | POA: Diagnosis not present

## 2019-05-13 DIAGNOSIS — N2581 Secondary hyperparathyroidism of renal origin: Secondary | ICD-10-CM | POA: Diagnosis not present

## 2019-05-13 DIAGNOSIS — D509 Iron deficiency anemia, unspecified: Secondary | ICD-10-CM | POA: Diagnosis not present

## 2019-05-15 DIAGNOSIS — D631 Anemia in chronic kidney disease: Secondary | ICD-10-CM | POA: Diagnosis not present

## 2019-05-15 DIAGNOSIS — D509 Iron deficiency anemia, unspecified: Secondary | ICD-10-CM | POA: Diagnosis not present

## 2019-05-15 DIAGNOSIS — Z992 Dependence on renal dialysis: Secondary | ICD-10-CM | POA: Diagnosis not present

## 2019-05-15 DIAGNOSIS — N186 End stage renal disease: Secondary | ICD-10-CM | POA: Diagnosis not present

## 2019-05-15 DIAGNOSIS — N2581 Secondary hyperparathyroidism of renal origin: Secondary | ICD-10-CM | POA: Diagnosis not present

## 2019-05-17 DIAGNOSIS — D509 Iron deficiency anemia, unspecified: Secondary | ICD-10-CM | POA: Diagnosis not present

## 2019-05-17 DIAGNOSIS — N2581 Secondary hyperparathyroidism of renal origin: Secondary | ICD-10-CM | POA: Diagnosis not present

## 2019-05-17 DIAGNOSIS — Z992 Dependence on renal dialysis: Secondary | ICD-10-CM | POA: Diagnosis not present

## 2019-05-17 DIAGNOSIS — N186 End stage renal disease: Secondary | ICD-10-CM | POA: Diagnosis not present

## 2019-05-17 DIAGNOSIS — D631 Anemia in chronic kidney disease: Secondary | ICD-10-CM | POA: Diagnosis not present

## 2019-05-20 DIAGNOSIS — N2581 Secondary hyperparathyroidism of renal origin: Secondary | ICD-10-CM | POA: Diagnosis not present

## 2019-05-20 DIAGNOSIS — D631 Anemia in chronic kidney disease: Secondary | ICD-10-CM | POA: Diagnosis not present

## 2019-05-20 DIAGNOSIS — Z992 Dependence on renal dialysis: Secondary | ICD-10-CM | POA: Diagnosis not present

## 2019-05-20 DIAGNOSIS — D509 Iron deficiency anemia, unspecified: Secondary | ICD-10-CM | POA: Diagnosis not present

## 2019-05-20 DIAGNOSIS — N186 End stage renal disease: Secondary | ICD-10-CM | POA: Diagnosis not present

## 2019-05-22 DIAGNOSIS — N186 End stage renal disease: Secondary | ICD-10-CM | POA: Diagnosis not present

## 2019-05-22 DIAGNOSIS — D509 Iron deficiency anemia, unspecified: Secondary | ICD-10-CM | POA: Diagnosis not present

## 2019-05-22 DIAGNOSIS — D631 Anemia in chronic kidney disease: Secondary | ICD-10-CM | POA: Diagnosis not present

## 2019-05-22 DIAGNOSIS — Z992 Dependence on renal dialysis: Secondary | ICD-10-CM | POA: Diagnosis not present

## 2019-05-22 DIAGNOSIS — N2581 Secondary hyperparathyroidism of renal origin: Secondary | ICD-10-CM | POA: Diagnosis not present

## 2019-05-24 DIAGNOSIS — D631 Anemia in chronic kidney disease: Secondary | ICD-10-CM | POA: Diagnosis not present

## 2019-05-24 DIAGNOSIS — N2581 Secondary hyperparathyroidism of renal origin: Secondary | ICD-10-CM | POA: Diagnosis not present

## 2019-05-24 DIAGNOSIS — D509 Iron deficiency anemia, unspecified: Secondary | ICD-10-CM | POA: Diagnosis not present

## 2019-05-24 DIAGNOSIS — N186 End stage renal disease: Secondary | ICD-10-CM | POA: Diagnosis not present

## 2019-05-24 DIAGNOSIS — Z992 Dependence on renal dialysis: Secondary | ICD-10-CM | POA: Diagnosis not present

## 2019-05-27 DIAGNOSIS — N2581 Secondary hyperparathyroidism of renal origin: Secondary | ICD-10-CM | POA: Diagnosis not present

## 2019-05-27 DIAGNOSIS — D631 Anemia in chronic kidney disease: Secondary | ICD-10-CM | POA: Diagnosis not present

## 2019-05-27 DIAGNOSIS — E119 Type 2 diabetes mellitus without complications: Secondary | ICD-10-CM | POA: Diagnosis not present

## 2019-05-27 DIAGNOSIS — Z794 Long term (current) use of insulin: Secondary | ICD-10-CM | POA: Diagnosis not present

## 2019-05-27 DIAGNOSIS — Z992 Dependence on renal dialysis: Secondary | ICD-10-CM | POA: Diagnosis not present

## 2019-05-27 DIAGNOSIS — D509 Iron deficiency anemia, unspecified: Secondary | ICD-10-CM | POA: Diagnosis not present

## 2019-05-27 DIAGNOSIS — N186 End stage renal disease: Secondary | ICD-10-CM | POA: Diagnosis not present

## 2019-05-29 DIAGNOSIS — D631 Anemia in chronic kidney disease: Secondary | ICD-10-CM | POA: Diagnosis not present

## 2019-05-29 DIAGNOSIS — N2581 Secondary hyperparathyroidism of renal origin: Secondary | ICD-10-CM | POA: Diagnosis not present

## 2019-05-29 DIAGNOSIS — N186 End stage renal disease: Secondary | ICD-10-CM | POA: Diagnosis not present

## 2019-05-29 DIAGNOSIS — Z992 Dependence on renal dialysis: Secondary | ICD-10-CM | POA: Diagnosis not present

## 2019-05-29 DIAGNOSIS — D509 Iron deficiency anemia, unspecified: Secondary | ICD-10-CM | POA: Diagnosis not present

## 2019-05-31 DIAGNOSIS — Z992 Dependence on renal dialysis: Secondary | ICD-10-CM | POA: Diagnosis not present

## 2019-05-31 DIAGNOSIS — D631 Anemia in chronic kidney disease: Secondary | ICD-10-CM | POA: Diagnosis not present

## 2019-05-31 DIAGNOSIS — N186 End stage renal disease: Secondary | ICD-10-CM | POA: Diagnosis not present

## 2019-05-31 DIAGNOSIS — N2581 Secondary hyperparathyroidism of renal origin: Secondary | ICD-10-CM | POA: Diagnosis not present

## 2019-05-31 DIAGNOSIS — D509 Iron deficiency anemia, unspecified: Secondary | ICD-10-CM | POA: Diagnosis not present

## 2019-06-03 DIAGNOSIS — Z992 Dependence on renal dialysis: Secondary | ICD-10-CM | POA: Diagnosis not present

## 2019-06-03 DIAGNOSIS — D509 Iron deficiency anemia, unspecified: Secondary | ICD-10-CM | POA: Diagnosis not present

## 2019-06-03 DIAGNOSIS — N186 End stage renal disease: Secondary | ICD-10-CM | POA: Diagnosis not present

## 2019-06-03 DIAGNOSIS — D631 Anemia in chronic kidney disease: Secondary | ICD-10-CM | POA: Diagnosis not present

## 2019-06-03 DIAGNOSIS — N2581 Secondary hyperparathyroidism of renal origin: Secondary | ICD-10-CM | POA: Diagnosis not present

## 2019-06-04 DIAGNOSIS — S82142D Displaced bicondylar fracture of left tibia, subsequent encounter for closed fracture with routine healing: Secondary | ICD-10-CM | POA: Diagnosis not present

## 2019-06-05 DIAGNOSIS — D631 Anemia in chronic kidney disease: Secondary | ICD-10-CM | POA: Diagnosis not present

## 2019-06-05 DIAGNOSIS — D509 Iron deficiency anemia, unspecified: Secondary | ICD-10-CM | POA: Diagnosis not present

## 2019-06-05 DIAGNOSIS — N2581 Secondary hyperparathyroidism of renal origin: Secondary | ICD-10-CM | POA: Diagnosis not present

## 2019-06-05 DIAGNOSIS — Z992 Dependence on renal dialysis: Secondary | ICD-10-CM | POA: Diagnosis not present

## 2019-06-05 DIAGNOSIS — N186 End stage renal disease: Secondary | ICD-10-CM | POA: Diagnosis not present

## 2019-06-07 DIAGNOSIS — N186 End stage renal disease: Secondary | ICD-10-CM | POA: Diagnosis not present

## 2019-06-07 DIAGNOSIS — Z992 Dependence on renal dialysis: Secondary | ICD-10-CM | POA: Diagnosis not present

## 2019-06-07 DIAGNOSIS — D631 Anemia in chronic kidney disease: Secondary | ICD-10-CM | POA: Diagnosis not present

## 2019-06-07 DIAGNOSIS — N2581 Secondary hyperparathyroidism of renal origin: Secondary | ICD-10-CM | POA: Diagnosis not present

## 2019-06-07 DIAGNOSIS — D509 Iron deficiency anemia, unspecified: Secondary | ICD-10-CM | POA: Diagnosis not present

## 2019-06-10 DIAGNOSIS — D509 Iron deficiency anemia, unspecified: Secondary | ICD-10-CM | POA: Diagnosis not present

## 2019-06-10 DIAGNOSIS — N2581 Secondary hyperparathyroidism of renal origin: Secondary | ICD-10-CM | POA: Diagnosis not present

## 2019-06-10 DIAGNOSIS — D631 Anemia in chronic kidney disease: Secondary | ICD-10-CM | POA: Diagnosis not present

## 2019-06-10 DIAGNOSIS — Z992 Dependence on renal dialysis: Secondary | ICD-10-CM | POA: Diagnosis not present

## 2019-06-10 DIAGNOSIS — N186 End stage renal disease: Secondary | ICD-10-CM | POA: Diagnosis not present

## 2019-06-12 DIAGNOSIS — N186 End stage renal disease: Secondary | ICD-10-CM | POA: Diagnosis not present

## 2019-06-12 DIAGNOSIS — D509 Iron deficiency anemia, unspecified: Secondary | ICD-10-CM | POA: Diagnosis not present

## 2019-06-12 DIAGNOSIS — D631 Anemia in chronic kidney disease: Secondary | ICD-10-CM | POA: Diagnosis not present

## 2019-06-12 DIAGNOSIS — Z992 Dependence on renal dialysis: Secondary | ICD-10-CM | POA: Diagnosis not present

## 2019-06-12 DIAGNOSIS — N2581 Secondary hyperparathyroidism of renal origin: Secondary | ICD-10-CM | POA: Diagnosis not present

## 2019-06-14 DIAGNOSIS — D631 Anemia in chronic kidney disease: Secondary | ICD-10-CM | POA: Diagnosis not present

## 2019-06-14 DIAGNOSIS — Z992 Dependence on renal dialysis: Secondary | ICD-10-CM | POA: Diagnosis not present

## 2019-06-14 DIAGNOSIS — N186 End stage renal disease: Secondary | ICD-10-CM | POA: Diagnosis not present

## 2019-06-14 DIAGNOSIS — D509 Iron deficiency anemia, unspecified: Secondary | ICD-10-CM | POA: Diagnosis not present

## 2019-06-14 DIAGNOSIS — N2581 Secondary hyperparathyroidism of renal origin: Secondary | ICD-10-CM | POA: Diagnosis not present

## 2019-06-17 DIAGNOSIS — D509 Iron deficiency anemia, unspecified: Secondary | ICD-10-CM | POA: Diagnosis not present

## 2019-06-17 DIAGNOSIS — D631 Anemia in chronic kidney disease: Secondary | ICD-10-CM | POA: Diagnosis not present

## 2019-06-17 DIAGNOSIS — Z992 Dependence on renal dialysis: Secondary | ICD-10-CM | POA: Diagnosis not present

## 2019-06-17 DIAGNOSIS — N186 End stage renal disease: Secondary | ICD-10-CM | POA: Diagnosis not present

## 2019-06-17 DIAGNOSIS — N2581 Secondary hyperparathyroidism of renal origin: Secondary | ICD-10-CM | POA: Diagnosis not present

## 2019-06-19 DIAGNOSIS — Z992 Dependence on renal dialysis: Secondary | ICD-10-CM | POA: Diagnosis not present

## 2019-06-19 DIAGNOSIS — N2581 Secondary hyperparathyroidism of renal origin: Secondary | ICD-10-CM | POA: Diagnosis not present

## 2019-06-19 DIAGNOSIS — N186 End stage renal disease: Secondary | ICD-10-CM | POA: Diagnosis not present

## 2019-06-19 DIAGNOSIS — D631 Anemia in chronic kidney disease: Secondary | ICD-10-CM | POA: Diagnosis not present

## 2019-06-19 DIAGNOSIS — D509 Iron deficiency anemia, unspecified: Secondary | ICD-10-CM | POA: Diagnosis not present

## 2019-06-21 DIAGNOSIS — N186 End stage renal disease: Secondary | ICD-10-CM | POA: Diagnosis not present

## 2019-06-21 DIAGNOSIS — D509 Iron deficiency anemia, unspecified: Secondary | ICD-10-CM | POA: Diagnosis not present

## 2019-06-21 DIAGNOSIS — N2581 Secondary hyperparathyroidism of renal origin: Secondary | ICD-10-CM | POA: Diagnosis not present

## 2019-06-21 DIAGNOSIS — Z992 Dependence on renal dialysis: Secondary | ICD-10-CM | POA: Diagnosis not present

## 2019-06-21 DIAGNOSIS — D631 Anemia in chronic kidney disease: Secondary | ICD-10-CM | POA: Diagnosis not present

## 2019-06-24 DIAGNOSIS — D631 Anemia in chronic kidney disease: Secondary | ICD-10-CM | POA: Diagnosis not present

## 2019-06-24 DIAGNOSIS — D509 Iron deficiency anemia, unspecified: Secondary | ICD-10-CM | POA: Diagnosis not present

## 2019-06-24 DIAGNOSIS — N2581 Secondary hyperparathyroidism of renal origin: Secondary | ICD-10-CM | POA: Diagnosis not present

## 2019-06-24 DIAGNOSIS — N186 End stage renal disease: Secondary | ICD-10-CM | POA: Diagnosis not present

## 2019-06-24 DIAGNOSIS — Z992 Dependence on renal dialysis: Secondary | ICD-10-CM | POA: Diagnosis not present

## 2019-06-26 DIAGNOSIS — Z992 Dependence on renal dialysis: Secondary | ICD-10-CM | POA: Diagnosis not present

## 2019-06-26 DIAGNOSIS — D509 Iron deficiency anemia, unspecified: Secondary | ICD-10-CM | POA: Diagnosis not present

## 2019-06-26 DIAGNOSIS — N186 End stage renal disease: Secondary | ICD-10-CM | POA: Diagnosis not present

## 2019-06-26 DIAGNOSIS — D631 Anemia in chronic kidney disease: Secondary | ICD-10-CM | POA: Diagnosis not present

## 2019-06-26 DIAGNOSIS — N2581 Secondary hyperparathyroidism of renal origin: Secondary | ICD-10-CM | POA: Diagnosis not present

## 2019-06-27 ENCOUNTER — Other Ambulatory Visit: Payer: Self-pay

## 2019-06-27 ENCOUNTER — Encounter: Payer: Self-pay | Admitting: Family

## 2019-06-27 ENCOUNTER — Ambulatory Visit (INDEPENDENT_AMBULATORY_CARE_PROVIDER_SITE_OTHER): Payer: Medicare Other | Admitting: Family

## 2019-06-27 DIAGNOSIS — L239 Allergic contact dermatitis, unspecified cause: Secondary | ICD-10-CM

## 2019-06-27 MED ORDER — TRIAMCINOLONE ACETONIDE 0.5 % EX OINT: 1.0000 "application " | TOPICAL_OINTMENT | Freq: Two times a day (BID) | CUTANEOUS | 0 refills | Status: DC

## 2019-06-27 MED ORDER — HYDROXYZINE PAMOATE 25 MG PO CAPS: 25.0000 mg | ORAL_CAPSULE | Freq: Three times a day (TID) | ORAL | 0 refills | Status: DC | PRN

## 2019-06-27 NOTE — Progress Notes (Signed)
   Virtual Visit via telephone Note  I connected with [name] on 06/27/19 at 3:57 pm by telephone and verified that I am speaking with the correct person using two identifiers. [name] is currently located at home and no one  is currently with her during visit. The provider, Evelina Dun, FNP is located in their office at time of visit.  I discussed the limitations, risks, security and privacy concerns of performing an evaluation and management service by telephone and the availability of in person appointments. I also discussed with the patient that there may be a patient responsible charge related to this service. The patient expressed understanding and agreed to proceed.   History and Present Illness:  Rash This is a new problem. The current episode started 1 to 4 weeks ago. The problem has been gradually worsening since onset. The affected locations include the right lower leg and left lower leg. The rash is characterized by itchiness and redness. Associated with: has worse bilateral braces on legs. Pertinent negatives include no congestion, cough, diarrhea, fatigue, fever, joint pain, rhinorrhea, shortness of breath or sore throat. Past treatments include anti-itch cream. The treatment provided mild relief.      Review of Systems  Constitutional: Negative for fatigue and fever.  HENT: Negative for congestion, rhinorrhea and sore throat.   Respiratory: Negative for cough and shortness of breath.   Gastrointestinal: Negative for diarrhea.  Musculoskeletal: Negative for joint pain.  Skin: Positive for rash.  All other systems reviewed and are negative.    Observations/Objective: No SOB or distress noted   Assessment and Plan: 1. Allergic contact dermatitis, unspecified trigger Do not scratch  Keep clean and dry States this was from her wearing the braces-This is done now.  RTO if symptoms worsen or do not improve  - triamcinolone ointment (KENALOG) 0.5 %; Apply  1 application topically 2 (two) times daily.  Dispense: 60 g; Refill: 0 - hydrOXYzine (VISTARIL) 25 MG capsule; Take 1 capsule (25 mg total) by mouth 3 (three) times daily as needed.  Dispense: 60 capsule; Refill: 0     I discussed the assessment and treatment plan with the patient. The patient was provided an opportunity to ask questions and all were answered. The patient agreed with the plan and demonstrated an understanding of the instructions.   The patient was advised to call back or seek an in-person evaluation if the symptoms worsen or if the condition fails to improve as anticipated.  The above assessment and management plan was discussed with the patient. The patient verbalized understanding of and has agreed to the management plan. Patient is aware to call the clinic if symptoms persist or worsen. Patient is aware when to return to the clinic for a follow-up visit. Patient educated on when it is appropriate to go to the emergency department.   Time call ended: 4:06 pm   I provided 9 minutes of non-face-to-face time during this encounter.    Evelina Dun, FNP

## 2019-06-28 DIAGNOSIS — Z992 Dependence on renal dialysis: Secondary | ICD-10-CM | POA: Diagnosis not present

## 2019-06-28 DIAGNOSIS — D509 Iron deficiency anemia, unspecified: Secondary | ICD-10-CM | POA: Diagnosis not present

## 2019-06-28 DIAGNOSIS — D631 Anemia in chronic kidney disease: Secondary | ICD-10-CM | POA: Diagnosis not present

## 2019-06-28 DIAGNOSIS — N186 End stage renal disease: Secondary | ICD-10-CM | POA: Diagnosis not present

## 2019-06-28 DIAGNOSIS — N2581 Secondary hyperparathyroidism of renal origin: Secondary | ICD-10-CM | POA: Diagnosis not present

## 2019-07-01 DIAGNOSIS — N2581 Secondary hyperparathyroidism of renal origin: Secondary | ICD-10-CM | POA: Diagnosis not present

## 2019-07-01 DIAGNOSIS — N186 End stage renal disease: Secondary | ICD-10-CM | POA: Diagnosis not present

## 2019-07-01 DIAGNOSIS — Z992 Dependence on renal dialysis: Secondary | ICD-10-CM | POA: Diagnosis not present

## 2019-07-01 DIAGNOSIS — D631 Anemia in chronic kidney disease: Secondary | ICD-10-CM | POA: Diagnosis not present

## 2019-07-01 DIAGNOSIS — D509 Iron deficiency anemia, unspecified: Secondary | ICD-10-CM | POA: Diagnosis not present

## 2019-07-02 DIAGNOSIS — S82142D Displaced bicondylar fracture of left tibia, subsequent encounter for closed fracture with routine healing: Secondary | ICD-10-CM | POA: Diagnosis not present

## 2019-07-03 DIAGNOSIS — D509 Iron deficiency anemia, unspecified: Secondary | ICD-10-CM | POA: Diagnosis not present

## 2019-07-03 DIAGNOSIS — N186 End stage renal disease: Secondary | ICD-10-CM | POA: Diagnosis not present

## 2019-07-03 DIAGNOSIS — N2581 Secondary hyperparathyroidism of renal origin: Secondary | ICD-10-CM | POA: Diagnosis not present

## 2019-07-03 DIAGNOSIS — Z992 Dependence on renal dialysis: Secondary | ICD-10-CM | POA: Diagnosis not present

## 2019-07-03 DIAGNOSIS — D631 Anemia in chronic kidney disease: Secondary | ICD-10-CM | POA: Diagnosis not present

## 2019-07-05 DIAGNOSIS — D509 Iron deficiency anemia, unspecified: Secondary | ICD-10-CM | POA: Diagnosis not present

## 2019-07-05 DIAGNOSIS — Z992 Dependence on renal dialysis: Secondary | ICD-10-CM | POA: Diagnosis not present

## 2019-07-05 DIAGNOSIS — N186 End stage renal disease: Secondary | ICD-10-CM | POA: Diagnosis not present

## 2019-07-05 DIAGNOSIS — N2581 Secondary hyperparathyroidism of renal origin: Secondary | ICD-10-CM | POA: Diagnosis not present

## 2019-07-05 DIAGNOSIS — D631 Anemia in chronic kidney disease: Secondary | ICD-10-CM | POA: Diagnosis not present

## 2019-07-08 DIAGNOSIS — N186 End stage renal disease: Secondary | ICD-10-CM | POA: Diagnosis not present

## 2019-07-08 DIAGNOSIS — N2581 Secondary hyperparathyroidism of renal origin: Secondary | ICD-10-CM | POA: Diagnosis not present

## 2019-07-08 DIAGNOSIS — D509 Iron deficiency anemia, unspecified: Secondary | ICD-10-CM | POA: Diagnosis not present

## 2019-07-08 DIAGNOSIS — Z992 Dependence on renal dialysis: Secondary | ICD-10-CM | POA: Diagnosis not present

## 2019-07-08 DIAGNOSIS — D631 Anemia in chronic kidney disease: Secondary | ICD-10-CM | POA: Diagnosis not present

## 2019-07-09 ENCOUNTER — Telehealth: Payer: Self-pay | Admitting: Family Medicine

## 2019-07-09 NOTE — Telephone Encounter (Signed)
Pt called and aware

## 2019-07-09 NOTE — Telephone Encounter (Signed)
If symptoms persist or worsen, she can schedule an in office visit. She can try 50 mg of the vistaril. Sedation precautions with medications.

## 2019-07-09 NOTE — Telephone Encounter (Signed)
LMTCB

## 2019-07-10 DIAGNOSIS — Z992 Dependence on renal dialysis: Secondary | ICD-10-CM | POA: Diagnosis not present

## 2019-07-10 DIAGNOSIS — N2581 Secondary hyperparathyroidism of renal origin: Secondary | ICD-10-CM | POA: Diagnosis not present

## 2019-07-10 DIAGNOSIS — N186 End stage renal disease: Secondary | ICD-10-CM | POA: Diagnosis not present

## 2019-07-10 DIAGNOSIS — D631 Anemia in chronic kidney disease: Secondary | ICD-10-CM | POA: Diagnosis not present

## 2019-07-10 DIAGNOSIS — D509 Iron deficiency anemia, unspecified: Secondary | ICD-10-CM | POA: Diagnosis not present

## 2019-07-11 ENCOUNTER — Other Ambulatory Visit: Payer: Self-pay

## 2019-07-12 DIAGNOSIS — Z992 Dependence on renal dialysis: Secondary | ICD-10-CM | POA: Diagnosis not present

## 2019-07-12 DIAGNOSIS — D631 Anemia in chronic kidney disease: Secondary | ICD-10-CM | POA: Diagnosis not present

## 2019-07-12 DIAGNOSIS — N186 End stage renal disease: Secondary | ICD-10-CM | POA: Diagnosis not present

## 2019-07-12 DIAGNOSIS — N2581 Secondary hyperparathyroidism of renal origin: Secondary | ICD-10-CM | POA: Diagnosis not present

## 2019-07-12 DIAGNOSIS — D509 Iron deficiency anemia, unspecified: Secondary | ICD-10-CM | POA: Diagnosis not present

## 2019-07-14 ENCOUNTER — Other Ambulatory Visit: Payer: Self-pay

## 2019-07-14 ENCOUNTER — Encounter: Payer: Self-pay | Admitting: Family Medicine

## 2019-07-14 ENCOUNTER — Ambulatory Visit (INDEPENDENT_AMBULATORY_CARE_PROVIDER_SITE_OTHER): Payer: Medicare Other | Admitting: Family Medicine

## 2019-07-14 VITALS — BP 147/47 | HR 55 | Temp 98.2°F | Ht 63.0 in | Wt 181.0 lb

## 2019-07-14 DIAGNOSIS — L309 Dermatitis, unspecified: Secondary | ICD-10-CM | POA: Diagnosis not present

## 2019-07-14 MED ORDER — HYDROXYZINE PAMOATE 25 MG PO CAPS: 25.0000 mg | ORAL_CAPSULE | Freq: Three times a day (TID) | ORAL | 1 refills | Status: DC | PRN

## 2019-07-14 MED ORDER — METHYLPREDNISOLONE ACETATE 80 MG/ML IJ SUSP
40.0000 mg | Freq: Once | INTRAMUSCULAR | Status: AC
  Administered 2019-07-14: 08:00:00 40 mg via INTRAMUSCULAR

## 2019-07-14 MED ORDER — BETAMETHASONE VALERATE 0.1 % EX CREA: TOPICAL_CREAM | Freq: Two times a day (BID) | CUTANEOUS | 0 refills | Status: DC

## 2019-07-14 MED ORDER — METHYLPREDNISOLONE ACETATE 40 MG/ML IJ SUSP: 40.0000 mg | Freq: Once | INTRAMUSCULAR | Status: DC

## 2019-07-14 MED ORDER — PREDNISONE 20 MG PO TABS: 60.0000 mg | ORAL_TABLET | Freq: Every day | ORAL | 0 refills | Status: AC

## 2019-07-14 NOTE — Patient Instructions (Addendum)
It was a pleasure seeing you today, [name].  Information regarding what we discussed is included in this packet.  Please make an appointment to see me as needed.   In a few days you may receive a survey in the mail or online from [employer] regarding your visit with [place] today. Please take a moment to fill this out. Your feedback is very important to our office. It can help [place] better understand your needs as well as improve your experience and satisfaction. Thank you for taking your time to complete it. We care about you.  Because of recent events of COVID-19 ("Coronavirus"), please follow CDC recommendations:   1. Wash your hand frequently 2. Avoid touching your face 3. Stay away from people who are sick 4. If you have symptoms such as fever, cough, shortness of breath then call your healthcare provider for further guidance 5. If you are sick, STAY AT HOME, unless otherwise directed by your healthcare provider. 6. Follow directions from state and national officials regarding staying safe    Please feel free to call our office if any questions or concerns arise.  Warm Regards, Monia Pouch, FNP-C [facility] [address] [place], [place] [address] [phone]

## 2019-07-14 NOTE — Progress Notes (Signed)
    Subjective:     [name] is a 68 y.o. female who presents for evaluation of a rash involving the chest, lower extremity, trunk and upper body. Rash started several weeks ago. Lesions are pink and slightly erythematous, and raised in texture. Rash has waxed and waned changed over time. Rash is pruritic. Associated symptoms: none. Patient denies: abdominal pain, arthralgia, congestion, cough, crankiness, decrease in appetite, decrease in energy level, fever, headache, irritability, myalgia, nausea, sore throat and vomiting. Patient has not had contacts with similar rash. Patient has not had new exposures (soaps, lotions, laundry detergents, foods, medications, plants, insects or animals).  The following portions of the patient's history were reviewed and updated as appropriate: allergies, current medications, past family history, past medical history, past social history, past surgical history and problem list.  Review of Systems Pertinent items are noted in HPI.    Objective:    BP (!) 147/47   Pulse (!) 55   Temp 98.2 F (36.8 C) (Oral)   Ht 5\' 3"  (1.6 m)   Wt 181 lb (82.1 kg)   BMI 32.06 kg/m  General:  alert, cooperative, appears stated age and no distress  Skin:  erythema noted on extremities and trunk and rash noted on extremities and trunk    Physical Exam  Constitutional: She is oriented to person, place, and time and well-developed, well-nourished, and in no distress. No distress.  HENT:  Head: Normocephalic and atraumatic.  Eyes: Pupils are equal, round, and reactive to light. Conjunctivae are normal.  Neck: Normal range of motion. Neck supple.  Cardiovascular: Normal rate and regular rhythm. Exam reveals no gallop and no friction rub.  No murmur heard. Pulmonary/Chest: Effort normal and breath sounds normal. No stridor. No respiratory distress.  Neurological: She is alert and oriented to person, place, and time.  Skin: Skin is warm and dry. Rash noted. Rash is  pustular and urticarial. There is erythema.  Psychiatric: Mood, memory, affect and judgment normal.    Assessment:   Elisah was seen today for rash.  Diagnoses and all orders for this visit:  Dermatitis Ongoing dermatitis to trunk and extremities. Will change topical steroid and give IM and PO steroids today. Continue vistaril as needed. No concerns for anaphylaxis. Pt to restart daily antihistamine. Report any new, worsening, or ongoing symptoms. May need referral to allergist if persistent.  -     betamethasone valerate (VALISONE) 0.1 % cream; Apply topically 2 (two) times daily. -     predniSONE (DELTASONE) 20 MG tablet; Take 3 tablets (60 mg total) by mouth daily for 5 days. -     methylPREDNISolone acetate (DEPO-MEDROL) injection 40 mg -     hydrOXYzine (VISTARIL) 25 MG capsule; Take 1 capsule (25 mg total) by mouth 3 (three) times daily as needed.      Plan:    Medications: steroids: prednisone oral, depomedrol IM and topical steroid: valisone. Written patient instruction given. Follow up in as needed or if not improving.    The above assessment and management plan was discussed with the patient. The patient verbalized understanding of and has agreed to the management plan. Patient is aware to call the clinic if symptoms fail to improve or worsen. Patient is aware when to return to the clinic for a follow-up visit. Patient educated on when it is appropriate to go to the emergency department.   Monia Pouch, FNP-C [facility] [address] [place], [place] [address] [phone]

## 2019-07-15 DIAGNOSIS — D631 Anemia in chronic kidney disease: Secondary | ICD-10-CM | POA: Diagnosis not present

## 2019-07-15 DIAGNOSIS — N186 End stage renal disease: Secondary | ICD-10-CM | POA: Diagnosis not present

## 2019-07-15 DIAGNOSIS — Z992 Dependence on renal dialysis: Secondary | ICD-10-CM | POA: Diagnosis not present

## 2019-07-15 DIAGNOSIS — D509 Iron deficiency anemia, unspecified: Secondary | ICD-10-CM | POA: Diagnosis not present

## 2019-07-15 DIAGNOSIS — N2581 Secondary hyperparathyroidism of renal origin: Secondary | ICD-10-CM | POA: Diagnosis not present

## 2019-07-17 DIAGNOSIS — Z992 Dependence on renal dialysis: Secondary | ICD-10-CM | POA: Diagnosis not present

## 2019-07-17 DIAGNOSIS — D509 Iron deficiency anemia, unspecified: Secondary | ICD-10-CM | POA: Diagnosis not present

## 2019-07-17 DIAGNOSIS — N186 End stage renal disease: Secondary | ICD-10-CM | POA: Diagnosis not present

## 2019-07-17 DIAGNOSIS — D631 Anemia in chronic kidney disease: Secondary | ICD-10-CM | POA: Diagnosis not present

## 2019-07-17 DIAGNOSIS — N2581 Secondary hyperparathyroidism of renal origin: Secondary | ICD-10-CM | POA: Diagnosis not present

## 2019-07-19 DIAGNOSIS — D509 Iron deficiency anemia, unspecified: Secondary | ICD-10-CM | POA: Diagnosis not present

## 2019-07-19 DIAGNOSIS — D631 Anemia in chronic kidney disease: Secondary | ICD-10-CM | POA: Diagnosis not present

## 2019-07-19 DIAGNOSIS — N186 End stage renal disease: Secondary | ICD-10-CM | POA: Diagnosis not present

## 2019-07-19 DIAGNOSIS — Z992 Dependence on renal dialysis: Secondary | ICD-10-CM | POA: Diagnosis not present

## 2019-07-19 DIAGNOSIS — N2581 Secondary hyperparathyroidism of renal origin: Secondary | ICD-10-CM | POA: Diagnosis not present

## 2019-07-21 ENCOUNTER — Ambulatory Visit (INDEPENDENT_AMBULATORY_CARE_PROVIDER_SITE_OTHER): Payer: Medicare Other | Admitting: Family Medicine

## 2019-07-21 ENCOUNTER — Encounter: Payer: Self-pay | Admitting: Family Medicine

## 2019-07-21 DIAGNOSIS — E1121 Type 2 diabetes mellitus with diabetic nephropathy: Secondary | ICD-10-CM | POA: Diagnosis not present

## 2019-07-21 DIAGNOSIS — E785 Hyperlipidemia, unspecified: Secondary | ICD-10-CM | POA: Diagnosis not present

## 2019-07-21 DIAGNOSIS — E1159 Type 2 diabetes mellitus with other circulatory complications: Secondary | ICD-10-CM | POA: Diagnosis not present

## 2019-07-21 DIAGNOSIS — J418 Mixed simple and mucopurulent chronic bronchitis: Secondary | ICD-10-CM

## 2019-07-21 DIAGNOSIS — I10 Essential (primary) hypertension: Secondary | ICD-10-CM

## 2019-07-21 DIAGNOSIS — I152 Hypertension secondary to endocrine disorders: Secondary | ICD-10-CM

## 2019-07-21 DIAGNOSIS — E1169 Type 2 diabetes mellitus with other specified complication: Secondary | ICD-10-CM | POA: Diagnosis not present

## 2019-07-21 DIAGNOSIS — N186 End stage renal disease: Secondary | ICD-10-CM | POA: Diagnosis not present

## 2019-07-21 DIAGNOSIS — E1122 Type 2 diabetes mellitus with diabetic chronic kidney disease: Secondary | ICD-10-CM

## 2019-07-21 DIAGNOSIS — Z992 Dependence on renal dialysis: Secondary | ICD-10-CM | POA: Diagnosis not present

## 2019-07-21 MED ORDER — LANTUS SOLOSTAR 100 UNIT/ML ~~LOC~~ SOPN: PEN_INJECTOR | SUBCUTANEOUS | 4 refills | Status: DC

## 2019-07-21 MED ORDER — HYDRALAZINE HCL 50 MG PO TABS: 50.0000 mg | ORAL_TABLET | Freq: Two times a day (BID) | ORAL | 3 refills | Status: DC

## 2019-07-21 MED ORDER — BUDESONIDE-FORMOTEROL FUMARATE 160-4.5 MCG/ACT IN AERO: 2.0000 | INHALATION_SPRAY | Freq: Two times a day (BID) | RESPIRATORY_TRACT | 3 refills | Status: DC | PRN

## 2019-07-21 MED ORDER — PRAVASTATIN SODIUM 20 MG PO TABS: 20.0000 mg | ORAL_TABLET | Freq: Every day | ORAL | 4 refills | Status: DC

## 2019-07-21 MED ORDER — ATENOLOL 50 MG PO TABS: 50.0000 mg | ORAL_TABLET | Freq: Two times a day (BID) | ORAL | 4 refills | Status: DC

## 2019-07-21 NOTE — Progress Notes (Signed)
Virtual Visit via telephone Note Due to COVID-19 pandemic this visit was conducted virtually. This visit type was conducted due to national recommendations for restrictions regarding the COVID-19 Pandemic (e.g. social distancing, sheltering in place) in an effort to limit this patient's exposure and mitigate transmission in our community. All issues noted in this document were discussed and addressed.  A physical exam was not performed with this format.   I connected with [name] on 07/21/19 at 0930 by telephone and verified that I am speaking with the correct person using two identifiers. [name] is currently located at home and family is currently with them during visit. The provider, Monia Pouch, FNP is located in their office at time of visit.  I discussed the limitations, risks, security and privacy concerns of performing an evaluation and management service by telephone and the availability of in person appointments. I also discussed with the patient that there may be a patient responsible charge related to this service. The patient expressed understanding and agreed to proceed.  Subjective:  Patient ID: [name], female    DOB: 01/18/1951, 68 y.o.   MRN: [id number]  Chief Complaint:  Medical Management of Chronic Issues   HPI: [name] is a 68 y.o. female presenting on 07/21/2019 for Medical Management of Chronic Issues  1. Type II diabetes mellitus with nephropathy (HCC)  [name] is a 68 y.o. female who presents for an follow up evaluation of Type 2 diabetes mellitus.  Current symptoms include none. Patient denies foot ulcerations, hyperglycemia, hypoglycemia , increased appetite, nausea, paresthesia of the feet, polydipsia, polyuria, visual disturbances and weight loss.  Current diabetic medications include Lantus.  Compliant with meds - Yes  Current monitoring regimen: home blood tests - several times weekly Home blood sugar records: trend:  stable , blood sugar 90 this morning Any episodes of hypoglycemia? no  Known diabetic complications: nephropathy, peripheral neuropathy, cardiovascular disease and peripheral vascular disease Cardiovascular risk factors: advanced age (older than 52 for men, 41 for women), diabetes mellitus, dyslipidemia, hypertension, microalbuminuria, obesity (BMI >= 30 kg/m2) and sedentary lifestyle Eye exam current (within one year): no, has upcoming appointment with Dr. Kathlen Mody Podiatry yearly?  No Weight trend: stable Prior visit with CDE: no Current diet: well balanced Current exercise: none  PNA Vaccine UTD?  Yes Hep B Vaccine?  Yes Tdap Vaccine UTD?  Yes  Is She on ACE inhibitor or angiotensin II receptor blocker?  Not Indicated   Is She on statin? Yes pravastatin Is She on ASA 81 mg daily?  Yes  Lab Review Labs from [facility]: 05/27/2019: A1C 6.5, Cholesterol 155 07/01/2019: Glucose 97, Albumin 4.2 07/15/2019: Hgb 10.2, Corrected Ca 9.6, Phosphorus 5.3, PTH intact 281, K 5.2  2. Hyperlipidemia associated with type 2 diabetes mellitus ([place]) Compliant with medications - Yes Current medications - pravastatin Side effects from medications - No Diet - generally healthy Exercise - none  Lab Results  Component Value Date   CHOL 138 06/09/2016   HDL 42 06/09/2016   LDLCALC 77 06/09/2016   TRIG 95 06/09/2016   CHOLHDL 3.3 06/09/2016     Family and personal medical history reviewed. Smoking and ETOH history reviewed.    3. Hypertension associated with diabetes ([place]) Complaint with meds - Yes Current Medications - hydralazine, nifedipine, torsemide, atenolol  Checking BP at home ranging 125/75 Exercising Regularly - No Watching Salt intake - Yes Pertinent ROS:  Headache - No Fatigue - No Visual Disturbances -  No Chest pain - No Dyspnea - No Palpitations - No LE edema - Yes They report good compliance with medications and can restate their regimen by memory. No medication side  effects.  Family, social, and smoking history reviewed.   BP Readings from Last 3 Encounters:  07/14/19 (!) 147/47  04/05/19 (!) 144/68  12/30/18 138/84   CMP Latest Ref Rng & Units 08/10/2017 06/15/2017 10/20/2016  Glucose 65 - 99 mg/dL 142(H) 101(H) 97  BUN 6 - 20 mg/dL 46(H) 42(H) 29(H)  Creatinine 0.44 - 1.00 mg/dL 7.50(H) 6.17(&gt;) 3.70(H)  Sodium 135 - 145 mmol/L 139 142 143  Potassium 3.5 - 5.1 mmol/L 4.0 4.3 3.3(L)  Chloride 101 - 111 mmol/L 97(L) 96 101  CO2 20 - 29 mmol/L - 29 -  Calcium 8.7 - 10.3 mg/dL - 9.6 -  Total Protein 6.0 - 8.5 g/dL - 6.6 -  Total Bilirubin 0.0 - 1.2 mg/dL - 0.3 -  Alkaline Phos 39 - 117 IU/L - 112 -  AST 0 - 40 IU/L - 22 -  ALT 0 - 32 IU/L - 27 -      4. End stage renal disease on dialysis due to type 2 diabetes mellitus [facility]) Currently attends dialysis Tuesday, Thursday, and Saturday. Does not miss appointments. Regular lab work during dialysis visits as noted above.   5. Mixed simple and mucopurulent chronic bronchitis ([place]) Doing well on current medications. No increased cough, shortness of breath, or sputum production.    Relevant past medical, surgical, family, and social history reviewed and updated as indicated.  Allergies and medications reviewed and updated.   Past Medical History:  Diagnosis Date  . Acute on chronic diastolic CHF (congestive heart failure) ([place]) 03/05/2015   Grade 2. EF 60-65%  . Anemia    hx of  . Anxiety   . Arthritis   . CAD (coronary artery disease)    s/p Stenting in 2005;  [place] 7/09: Vigorous LV function, 2-3+ MR, RI 40%, RI stent patent, proximal-mid RCA 40-50%;  [place] 7/09:  EF 55-65%, trivial MR;  [place] 11/18/12: EF 66%, normal LV wall motion, mild to moderate anterior ischemia - reviewed by [facility] and felt to rep breast atten (low risk);  E[place] 6/14: mild LVH, EF 55-65%, Gr 2 DD, PASP 44, trivial eff   . Chronic kidney disease    CREATININE IS UP--GOING TO KIDNEY MD   . Chronic neck pain   . COPD  (chronic obstructive pulmonary disease) ([place])   . Degenerative joint disease    left shoulder  . Diabetic neuropathy ([place])   . Diverticulosis   . Esophageal stricture   . GERD (gastroesophageal reflux disease)   . Hyperlipidemia   . Hypertension   . IDDM (insulin dependent diabetes mellitus) ([place])   . Pericardial effusion 03/06/2015   Very small per echo ; pleural nodules seen on CT chest.  . Peripheral vascular disease ([place])   . Stroke [facility])    "mini- years ago"  . Vitamin D deficiency     Past Surgical History:  Procedure Laterality Date  . A/V FISTULAGRAM N/A 08/10/2017   Procedure: A/V Fistulagram - Right Arm;  Surgeon: Elam Dutch, MD;  Location: [place] [facility];  Service: Cardiovascular;  Laterality: N/A;  . ABDOMINAL HYSTERECTOMY    . AV FISTULA PLACEMENT Right 07/20/2015   Procedure: RADIAL CEPHALIC ARTERIOVENOUS FISTULA CREATION RIGHT ARM;  Surgeon: Angelia Mould, MD;  Location: [place];  Service: Vascular;  Laterality: Right;  .  AV FISTULA PLACEMENT Right 11/26/2015   Procedure:  Creation of RIGHT BRACHIO-CEPHALIC arteriovenous fistula;  Surgeon: Angelia Mould, MD;  Location: [place];  Service: Vascular;  Laterality: Right;  . BACK SURGERY     2007 & 2013  . BLADDER SURGERY     Tack  . CARDIAC CATHETERIZATION  2003, 2005, 2009   1 stent  . cardiac stents    . CATARACT EXTRACTION W/PHACO Right 05/04/2014   Procedure: CATARACT EXTRACTION PHACO AND INTRAOCULAR LENS PLACEMENT (IOC);  Surgeon: Williams Che, MD;  Location: [facility];  Service: Ophthalmology;  Laterality: Right;  CDE 1.47  . CATARACT EXTRACTION W/PHACO Left 07/20/2014   Procedure: CATARACT EXTRACTION WITH PHACO AND INTRAOCULAR LENS PLACEMENT LEFT EYE CDE=1.79;  Surgeon: Williams Che, MD;  Location: [facility];  Service: Ophthalmology;  Laterality: Left;  . CERVICAL SPINE SURGERY  2012   8 screws  . CYSTOSCOPY WITH INJECTION N/A 06/03/2013   Procedure: MACROPLASTIQUE  INJECTION ;  Surgeon: Malka So, MD;  Location: [facility];  Service: Urology;  Laterality: N/A;  . FISTULOGRAM Right 10/20/2016   Procedure: FISTULOGRAM WITH POSSIBLE INTERVENTION;  Surgeon: [name], MD;  Location: [facility];  Service: Vascular;  Laterality: Right;  . INSERTION OF DIALYSIS CATHETER N/A 11/26/2015   Procedure: INSERTION OF DIALYSIS CATHETER;  Surgeon: Angelia Mould, MD;  Location: [place];  Service: Vascular;  Laterality: N/A;  . LAMINECTOMY  12/29/2011  . LUMBAR LAMINECTOMY/DECOMPRESSION MICRODISCECTOMY  12/29/2011   Procedure: LUMBAR LAMINECTOMY/DECOMPRESSION MICRODISCECTOMY;  Surgeon: Floyce Stakes, MD;  Location: [place] [facility];  Service: Neurosurgery;  Laterality: N/A;  Lumbar Two through Lumbar Five Laminectomies/Cellsaver  . PARTIAL HYSTERECTOMY    . PERIPHERAL VASCULAR CATHETERIZATION N/A 11/22/2015   Procedure: Fistulagram;  Surgeon: Angelia Mould, MD;  Location: [place] [facility];  Service: Cardiovascular;  Laterality: N/A;  . PERIPHERAL VASCULAR INTERVENTION  08/10/2017   Procedure: PERIPHERAL VASCULAR INTERVENTION;  Surgeon: Elam Dutch, MD;  Location: [place] [facility];  Service: Cardiovascular;;  . SHOULDER SURGERY Right    Rotator cuff    Social History   Socioeconomic History  . Marital status: Widowed    Spouse name: Not on file  . Number of children: 2  . Years of education: GED  . Highest education level: GED or equivalent  Occupational History  . Occupation: Disabled  Social Needs  . Financial resource strain: Not very hard  . Food insecurity    Worry: Never true    Inability: Never true  . Transportation needs    Medical: No    Non-medical: No  Tobacco Use  . Smoking status: Former Smoker    Packs/day: 1.00    Years: 30.00    Pack years: 30.00    Quit date: 12/04/2001    Years since quitting: 17.6  . Smokeless tobacco: Never Used  Substance and Sexual Activity  . Alcohol use: No    Alcohol/week: 6.0 standard drinks    Types: 6 Cans of  beer per week  . Drug use: No  . Sexual activity: Not Currently    Birth control/protection: Surgical  Lifestyle  . Physical activity    Days per week: 0 days    Minutes per session: 0 min  . Stress: Only a little  Relationships  . Social connections    Talks on phone: More than three times a week    Gets together: More than three times a week    Attends religious  service: More than 4 times per year    Active member of club or organization: Yes    Attends meetings of clubs or organizations: More than 4 times per year    Relationship status: Widowed  . Intimate partner violence    Fear of current or ex partner: No    Emotionally abused: No    Physically abused: No    Forced sexual activity: No  Other Topics Concern  . Not on file  Social History Narrative   Lives in [place]    Outpatient Encounter Medications as of 07/21/2019  Medication Sig  . albuterol (PROVENTIL) (2.5 MG/3ML) 0.083% nebulizer solution Take 3 mLs (2.5 mg total) by nebulization every 6 (six) hours as needed for wheezing or shortness of breath.  [name] albuterol (VENTOLIN HFA) 108 (90 Base) MCG/ACT inhaler Inhale 2 puffs into the lungs every 6 (six) hours as needed for wheezing or shortness of breath.  [name] aspirin 81 MG EC tablet Take 81 mg by mouth daily.    [name] atenolol (TENORMIN) 50 MG tablet Take 1 tablet (50 mg total) by mouth 2 (two) times daily.  . betamethasone valerate (VALISONE) 0.1 % cream Apply topically 2 (two) times daily.  . budesonide-formoterol (SYMBICORT) 160-4.5 MCG/ACT inhaler Inhale 2 puffs into the lungs 2 (two) times daily as needed for up to 30 days.  . calcium acetate (PHOSLO) 667 MG capsule Take 667-1,334 mg by mouth See admin instructions. Take 2 capsules with meals 3 times daily and 1 capsule with snacks  . Carboxymethylcellulose Sodium (THERATEARS OP) Place 2 drops into both eyes as needed (for dry eyes). Reported on 12/09/2015  . hydrALAZINE (APRESOLINE) 50 MG tablet Take 1 tablet (50 mg  total) by mouth 2 (two) times daily.  . hydrOXYzine (VISTARIL) 25 MG capsule Take 1 capsule (25 mg total) by mouth 3 (three) times daily as needed.  . Insulin Glargine (LANTUS SOLOSTAR) 100 UNIT/ML Solostar Pen INJECT 15 TO 25 UNITS INTO THE SKIN DAILY AT 10 PM.  . NIFEdipine (PROCARDIA XL/ADALAT-CC) 90 MG 24 hr tablet Take 90 mg by mouth every evening.   . nitroGLYCERIN (NITROSTAT) 0.4 MG SL tablet Place 1 tablet (0.4 mg total) under the tongue every 5 (five) minutes as needed for chest pain. (Patient not taking: Reported on 07/14/2019)  . pravastatin (PRAVACHOL) 20 MG tablet Take 1 tablet (20 mg total) by mouth daily for 30 days.  [name] torsemide (DEMADEX) 20 MG tablet 20 mg. Take two tablets by mouth once daily  . triamcinolone ointment (KENALOG) 0.5 % Apply 1 application topically 2 (two) times daily.   No facility-administered encounter medications on file as of 07/21/2019.     Allergies  Allergen Reactions  . Azithromycin     Prolonged QT  . Ace Inhibitors Cough  . Clopidogrel Bisulfate Other (See Comments)    REACTION: Sick, Headache, "Felt terrible"  . Codeine Other (See Comments)    REACTION: hallucinations  . Lisinopril Other (See Comments)    cough  . Penicillins Other (See Comments)    Has patient had a PCN reaction causing immediate rash, facial/tongue/throat swelling, SOB or lightheadedness with hypotension: unknown Has patient had a PCN reaction causing severe rash involving mucus membranes or skin necrosis: unknown Has patient had a PCN reaction that required hospitalization unknown Has patient had a PCN reaction occurring within the last 10 years:unknown If all of the above answers are "NO", then may proceed with Cephalosporin use.     Review of Systems  Constitutional: Negative  for activity change, appetite change, chills, diaphoresis, fatigue, fever and unexpected weight change.  Eyes: Negative for photophobia and visual disturbance.  Respiratory: Negative for cough,  chest tightness, shortness of breath and wheezing.   Cardiovascular: Positive for leg swelling. Negative for chest pain and palpitations.  Gastrointestinal: Negative for abdominal pain, anal bleeding, blood in stool, constipation, diarrhea, nausea and vomiting.  Endocrine: Negative for cold intolerance, heat intolerance, polydipsia, polyphagia and polyuria.  Musculoskeletal: Negative for arthralgias and myalgias.  Skin: Negative for color change and rash.  Neurological: Negative for dizziness, tremors, seizures, syncope, facial asymmetry, speech difficulty, weakness, light-headedness, numbness and headaches.  Psychiatric/Behavioral: Negative for confusion.  All other systems reviewed and are negative.        Observations/Objective: No vital signs or physical exam, this was a telephone or virtual health encounter.  Pt alert and oriented, answers all questions appropriately, and able to speak in full sentences.    Assessment and Plan: Lajasmine was seen today for medical management of chronic issues.  Diagnoses and all orders for this visit:  Type II diabetes mellitus with nephropathy ([place]) Pt doing very well with current regimen. Last A1C 6.5. Due for eye exam with Dr. Kathlen Mody and has this scheduled. Denies need for refills today.  Diet and exercise encouraged. Report any persistent high or low readings.  Follow up in office in 1 month.   Hyperlipidemia associated with type 2 diabetes mellitus (HCC) Last total cholesterol 155. Doing well on current regimen. Diet and exercise encouraged. Follow up in office in 1 month.   Hypertension associated with diabetes ([place]) Well controlled with current medications. Report any persistent high or low readings. Follow up in office in 1 month.   End stage renal disease on dialysis due to type 2 diabetes mellitus ([place]) Very compliant with dialysis. Does not miss appointments. Labs from dialysis are forwarded to [facility].   Mixed simple and mucopurulent  chronic bronchitis (HCC) Well controlled, continue current regimen.   Pt denies need for refills today. States she will have pharmacy request if needed. Medications reviewed and expiring medications refilled today, see below.   Meds ordered this encounter  Medications  . budesonide-formoterol (SYMBICORT) 160-4.5 MCG/ACT inhaler    Sig: Inhale 2 puffs into the lungs 2 (two) times daily as needed.    Dispense:  1 Inhaler    Refill:  3    Order Specific Question:   Supervising Provider    Answer:   [name] [phone]  . atenolol (TENORMIN) 50 MG tablet    Sig: Take 1 tablet (50 mg total) by mouth 2 (two) times daily.    Dispense:  60 tablet    Refill:  4    Order Specific Question:   Supervising Provider    Answer:   [name] [phone]  . hydrALAZINE (APRESOLINE) 50 MG tablet    Sig: Take 1 tablet (50 mg total) by mouth 2 (two) times daily.    Dispense:  60 tablet    Refill:  3    Order Specific Question:   Supervising Provider    Answer:   [name] [phone]  . Insulin Glargine (LANTUS SOLOSTAR) 100 UNIT/ML Solostar Pen    Sig: INJECT 15 TO 25 UNITS INTO THE SKIN DAILY AT 10 PM.    Dispense:  15 mL    Refill:  4    Order Specific Question:   Supervising Provider    Answer:   Jeneen Rinks  . pravastatin (PRAVACHOL) 20 MG tablet  Sig: Take 1 tablet (20 mg total) by mouth daily.    Dispense:  30 tablet    Refill:  4    Order Specific Question:   Supervising Provider    Answer:   [name] [phone]       Follow Up Instructions: Return in about 1 month (around 08/21/2019), or if symptoms worsen or fail to improve, for DM, HTN, CKD.    I discussed the assessment and treatment plan with the patient. The patient was provided an opportunity to ask questions and all were answered. The patient agreed with the plan and demonstrated an understanding of the instructions.   The patient was advised to call back or seek an in-person evaluation if the  symptoms worsen or if the condition fails to improve as anticipated.  The above assessment and management plan was discussed with the patient. The patient verbalized understanding of and has agreed to the management plan. Patient is aware to call the clinic if symptoms persist or worsen. Patient is aware when to return to the clinic for a follow-up visit. Patient educated on when it is appropriate to go to the emergency department.    I provided 25 minutes of non-face-to-face time during this encounter. The call started at 0930. The call ended at 0955. The other time was used for coordination of care.    Monia Pouch, FNP-C [facility] [address] [place],  [address] [phone] 07/21/19

## 2019-07-22 DIAGNOSIS — Z992 Dependence on renal dialysis: Secondary | ICD-10-CM | POA: Diagnosis not present

## 2019-07-22 DIAGNOSIS — N2581 Secondary hyperparathyroidism of renal origin: Secondary | ICD-10-CM | POA: Diagnosis not present

## 2019-07-22 DIAGNOSIS — D509 Iron deficiency anemia, unspecified: Secondary | ICD-10-CM | POA: Diagnosis not present

## 2019-07-22 DIAGNOSIS — N186 End stage renal disease: Secondary | ICD-10-CM | POA: Diagnosis not present

## 2019-07-22 DIAGNOSIS — D631 Anemia in chronic kidney disease: Secondary | ICD-10-CM | POA: Diagnosis not present

## 2019-07-24 DIAGNOSIS — Z992 Dependence on renal dialysis: Secondary | ICD-10-CM | POA: Diagnosis not present

## 2019-07-24 DIAGNOSIS — N186 End stage renal disease: Secondary | ICD-10-CM | POA: Diagnosis not present

## 2019-07-24 DIAGNOSIS — N2581 Secondary hyperparathyroidism of renal origin: Secondary | ICD-10-CM | POA: Diagnosis not present

## 2019-07-24 DIAGNOSIS — D631 Anemia in chronic kidney disease: Secondary | ICD-10-CM | POA: Diagnosis not present

## 2019-07-24 DIAGNOSIS — D509 Iron deficiency anemia, unspecified: Secondary | ICD-10-CM | POA: Diagnosis not present

## 2019-07-26 DIAGNOSIS — D631 Anemia in chronic kidney disease: Secondary | ICD-10-CM | POA: Diagnosis not present

## 2019-07-26 DIAGNOSIS — Z992 Dependence on renal dialysis: Secondary | ICD-10-CM | POA: Diagnosis not present

## 2019-07-26 DIAGNOSIS — N186 End stage renal disease: Secondary | ICD-10-CM | POA: Diagnosis not present

## 2019-07-26 DIAGNOSIS — N2581 Secondary hyperparathyroidism of renal origin: Secondary | ICD-10-CM | POA: Diagnosis not present

## 2019-07-26 DIAGNOSIS — D509 Iron deficiency anemia, unspecified: Secondary | ICD-10-CM | POA: Diagnosis not present

## 2019-07-29 DIAGNOSIS — Z992 Dependence on renal dialysis: Secondary | ICD-10-CM | POA: Diagnosis not present

## 2019-07-29 DIAGNOSIS — D509 Iron deficiency anemia, unspecified: Secondary | ICD-10-CM | POA: Diagnosis not present

## 2019-07-29 DIAGNOSIS — D631 Anemia in chronic kidney disease: Secondary | ICD-10-CM | POA: Diagnosis not present

## 2019-07-29 DIAGNOSIS — N186 End stage renal disease: Secondary | ICD-10-CM | POA: Diagnosis not present

## 2019-07-29 DIAGNOSIS — N2581 Secondary hyperparathyroidism of renal origin: Secondary | ICD-10-CM | POA: Diagnosis not present

## 2019-07-31 DIAGNOSIS — N2581 Secondary hyperparathyroidism of renal origin: Secondary | ICD-10-CM | POA: Diagnosis not present

## 2019-07-31 DIAGNOSIS — D631 Anemia in chronic kidney disease: Secondary | ICD-10-CM | POA: Diagnosis not present

## 2019-07-31 DIAGNOSIS — D509 Iron deficiency anemia, unspecified: Secondary | ICD-10-CM | POA: Diagnosis not present

## 2019-07-31 DIAGNOSIS — Z992 Dependence on renal dialysis: Secondary | ICD-10-CM | POA: Diagnosis not present

## 2019-07-31 DIAGNOSIS — N186 End stage renal disease: Secondary | ICD-10-CM | POA: Diagnosis not present

## 2019-08-02 DIAGNOSIS — N186 End stage renal disease: Secondary | ICD-10-CM | POA: Diagnosis not present

## 2019-08-02 DIAGNOSIS — D631 Anemia in chronic kidney disease: Secondary | ICD-10-CM | POA: Diagnosis not present

## 2019-08-02 DIAGNOSIS — N2581 Secondary hyperparathyroidism of renal origin: Secondary | ICD-10-CM | POA: Diagnosis not present

## 2019-08-02 DIAGNOSIS — Z992 Dependence on renal dialysis: Secondary | ICD-10-CM | POA: Diagnosis not present

## 2019-08-02 DIAGNOSIS — D509 Iron deficiency anemia, unspecified: Secondary | ICD-10-CM | POA: Diagnosis not present

## 2019-08-04 DIAGNOSIS — N186 End stage renal disease: Secondary | ICD-10-CM | POA: Diagnosis not present

## 2019-08-04 DIAGNOSIS — Z992 Dependence on renal dialysis: Secondary | ICD-10-CM | POA: Diagnosis not present

## 2019-08-05 DIAGNOSIS — D631 Anemia in chronic kidney disease: Secondary | ICD-10-CM | POA: Diagnosis not present

## 2019-08-05 DIAGNOSIS — N2581 Secondary hyperparathyroidism of renal origin: Secondary | ICD-10-CM | POA: Diagnosis not present

## 2019-08-05 DIAGNOSIS — N186 End stage renal disease: Secondary | ICD-10-CM | POA: Diagnosis not present

## 2019-08-05 DIAGNOSIS — Z992 Dependence on renal dialysis: Secondary | ICD-10-CM | POA: Diagnosis not present

## 2019-08-05 DIAGNOSIS — D509 Iron deficiency anemia, unspecified: Secondary | ICD-10-CM | POA: Diagnosis not present

## 2019-08-05 DIAGNOSIS — Z23 Encounter for immunization: Secondary | ICD-10-CM | POA: Diagnosis not present

## 2019-08-06 DIAGNOSIS — Z961 Presence of intraocular lens: Secondary | ICD-10-CM | POA: Diagnosis not present

## 2019-08-06 DIAGNOSIS — E119 Type 2 diabetes mellitus without complications: Secondary | ICD-10-CM | POA: Diagnosis not present

## 2019-08-06 DIAGNOSIS — H04123 Dry eye syndrome of bilateral lacrimal glands: Secondary | ICD-10-CM | POA: Diagnosis not present

## 2019-08-06 DIAGNOSIS — H40013 Open angle with borderline findings, low risk, bilateral: Secondary | ICD-10-CM | POA: Diagnosis not present

## 2019-08-06 LAB — HM DIABETES EYE EXAM

## 2019-08-07 DIAGNOSIS — D631 Anemia in chronic kidney disease: Secondary | ICD-10-CM | POA: Diagnosis not present

## 2019-08-07 DIAGNOSIS — Z992 Dependence on renal dialysis: Secondary | ICD-10-CM | POA: Diagnosis not present

## 2019-08-07 DIAGNOSIS — N186 End stage renal disease: Secondary | ICD-10-CM | POA: Diagnosis not present

## 2019-08-07 DIAGNOSIS — D509 Iron deficiency anemia, unspecified: Secondary | ICD-10-CM | POA: Diagnosis not present

## 2019-08-07 DIAGNOSIS — Z23 Encounter for immunization: Secondary | ICD-10-CM | POA: Diagnosis not present

## 2019-08-07 DIAGNOSIS — N2581 Secondary hyperparathyroidism of renal origin: Secondary | ICD-10-CM | POA: Diagnosis not present

## 2019-08-09 DIAGNOSIS — D509 Iron deficiency anemia, unspecified: Secondary | ICD-10-CM | POA: Diagnosis not present

## 2019-08-09 DIAGNOSIS — N186 End stage renal disease: Secondary | ICD-10-CM | POA: Diagnosis not present

## 2019-08-09 DIAGNOSIS — Z23 Encounter for immunization: Secondary | ICD-10-CM | POA: Diagnosis not present

## 2019-08-09 DIAGNOSIS — D631 Anemia in chronic kidney disease: Secondary | ICD-10-CM | POA: Diagnosis not present

## 2019-08-09 DIAGNOSIS — N2581 Secondary hyperparathyroidism of renal origin: Secondary | ICD-10-CM | POA: Diagnosis not present

## 2019-08-09 DIAGNOSIS — Z992 Dependence on renal dialysis: Secondary | ICD-10-CM | POA: Diagnosis not present

## 2019-08-12 DIAGNOSIS — D509 Iron deficiency anemia, unspecified: Secondary | ICD-10-CM | POA: Diagnosis not present

## 2019-08-12 DIAGNOSIS — D631 Anemia in chronic kidney disease: Secondary | ICD-10-CM | POA: Diagnosis not present

## 2019-08-12 DIAGNOSIS — Z992 Dependence on renal dialysis: Secondary | ICD-10-CM | POA: Diagnosis not present

## 2019-08-12 DIAGNOSIS — N186 End stage renal disease: Secondary | ICD-10-CM | POA: Diagnosis not present

## 2019-08-12 DIAGNOSIS — Z23 Encounter for immunization: Secondary | ICD-10-CM | POA: Diagnosis not present

## 2019-08-12 DIAGNOSIS — N2581 Secondary hyperparathyroidism of renal origin: Secondary | ICD-10-CM | POA: Diagnosis not present

## 2019-08-14 DIAGNOSIS — N2581 Secondary hyperparathyroidism of renal origin: Secondary | ICD-10-CM | POA: Diagnosis not present

## 2019-08-14 DIAGNOSIS — Z23 Encounter for immunization: Secondary | ICD-10-CM | POA: Diagnosis not present

## 2019-08-14 DIAGNOSIS — D509 Iron deficiency anemia, unspecified: Secondary | ICD-10-CM | POA: Diagnosis not present

## 2019-08-14 DIAGNOSIS — Z992 Dependence on renal dialysis: Secondary | ICD-10-CM | POA: Diagnosis not present

## 2019-08-14 DIAGNOSIS — D631 Anemia in chronic kidney disease: Secondary | ICD-10-CM | POA: Diagnosis not present

## 2019-08-14 DIAGNOSIS — N186 End stage renal disease: Secondary | ICD-10-CM | POA: Diagnosis not present

## 2019-08-16 DIAGNOSIS — Z23 Encounter for immunization: Secondary | ICD-10-CM | POA: Diagnosis not present

## 2019-08-16 DIAGNOSIS — N2581 Secondary hyperparathyroidism of renal origin: Secondary | ICD-10-CM | POA: Diagnosis not present

## 2019-08-16 DIAGNOSIS — Z992 Dependence on renal dialysis: Secondary | ICD-10-CM | POA: Diagnosis not present

## 2019-08-16 DIAGNOSIS — D509 Iron deficiency anemia, unspecified: Secondary | ICD-10-CM | POA: Diagnosis not present

## 2019-08-16 DIAGNOSIS — N186 End stage renal disease: Secondary | ICD-10-CM | POA: Diagnosis not present

## 2019-08-16 DIAGNOSIS — D631 Anemia in chronic kidney disease: Secondary | ICD-10-CM | POA: Diagnosis not present

## 2019-08-19 DIAGNOSIS — N186 End stage renal disease: Secondary | ICD-10-CM | POA: Diagnosis not present

## 2019-08-19 DIAGNOSIS — Z23 Encounter for immunization: Secondary | ICD-10-CM | POA: Diagnosis not present

## 2019-08-19 DIAGNOSIS — D631 Anemia in chronic kidney disease: Secondary | ICD-10-CM | POA: Diagnosis not present

## 2019-08-19 DIAGNOSIS — Z992 Dependence on renal dialysis: Secondary | ICD-10-CM | POA: Diagnosis not present

## 2019-08-19 DIAGNOSIS — D509 Iron deficiency anemia, unspecified: Secondary | ICD-10-CM | POA: Diagnosis not present

## 2019-08-19 DIAGNOSIS — N2581 Secondary hyperparathyroidism of renal origin: Secondary | ICD-10-CM | POA: Diagnosis not present

## 2019-08-21 DIAGNOSIS — D509 Iron deficiency anemia, unspecified: Secondary | ICD-10-CM | POA: Diagnosis not present

## 2019-08-21 DIAGNOSIS — D631 Anemia in chronic kidney disease: Secondary | ICD-10-CM | POA: Diagnosis not present

## 2019-08-21 DIAGNOSIS — Z992 Dependence on renal dialysis: Secondary | ICD-10-CM | POA: Diagnosis not present

## 2019-08-21 DIAGNOSIS — N186 End stage renal disease: Secondary | ICD-10-CM | POA: Diagnosis not present

## 2019-08-21 DIAGNOSIS — N2581 Secondary hyperparathyroidism of renal origin: Secondary | ICD-10-CM | POA: Diagnosis not present

## 2019-08-21 DIAGNOSIS — Z23 Encounter for immunization: Secondary | ICD-10-CM | POA: Diagnosis not present

## 2019-08-23 DIAGNOSIS — Z992 Dependence on renal dialysis: Secondary | ICD-10-CM | POA: Diagnosis not present

## 2019-08-23 DIAGNOSIS — N186 End stage renal disease: Secondary | ICD-10-CM | POA: Diagnosis not present

## 2019-08-23 DIAGNOSIS — D631 Anemia in chronic kidney disease: Secondary | ICD-10-CM | POA: Diagnosis not present

## 2019-08-23 DIAGNOSIS — D509 Iron deficiency anemia, unspecified: Secondary | ICD-10-CM | POA: Diagnosis not present

## 2019-08-23 DIAGNOSIS — Z23 Encounter for immunization: Secondary | ICD-10-CM | POA: Diagnosis not present

## 2019-08-23 DIAGNOSIS — N2581 Secondary hyperparathyroidism of renal origin: Secondary | ICD-10-CM | POA: Diagnosis not present

## 2019-08-26 DIAGNOSIS — N2581 Secondary hyperparathyroidism of renal origin: Secondary | ICD-10-CM | POA: Diagnosis not present

## 2019-08-26 DIAGNOSIS — Z992 Dependence on renal dialysis: Secondary | ICD-10-CM | POA: Diagnosis not present

## 2019-08-26 DIAGNOSIS — N186 End stage renal disease: Secondary | ICD-10-CM | POA: Diagnosis not present

## 2019-08-26 DIAGNOSIS — Z23 Encounter for immunization: Secondary | ICD-10-CM | POA: Diagnosis not present

## 2019-08-26 DIAGNOSIS — D509 Iron deficiency anemia, unspecified: Secondary | ICD-10-CM | POA: Diagnosis not present

## 2019-08-26 DIAGNOSIS — D631 Anemia in chronic kidney disease: Secondary | ICD-10-CM | POA: Diagnosis not present

## 2019-08-28 DIAGNOSIS — Z23 Encounter for immunization: Secondary | ICD-10-CM | POA: Diagnosis not present

## 2019-08-28 DIAGNOSIS — N186 End stage renal disease: Secondary | ICD-10-CM | POA: Diagnosis not present

## 2019-08-28 DIAGNOSIS — E119 Type 2 diabetes mellitus without complications: Secondary | ICD-10-CM | POA: Diagnosis not present

## 2019-08-28 DIAGNOSIS — I259 Chronic ischemic heart disease, unspecified: Secondary | ICD-10-CM | POA: Diagnosis not present

## 2019-08-28 DIAGNOSIS — N2581 Secondary hyperparathyroidism of renal origin: Secondary | ICD-10-CM | POA: Diagnosis not present

## 2019-08-28 DIAGNOSIS — Z794 Long term (current) use of insulin: Secondary | ICD-10-CM | POA: Diagnosis not present

## 2019-08-28 DIAGNOSIS — Z992 Dependence on renal dialysis: Secondary | ICD-10-CM | POA: Diagnosis not present

## 2019-08-28 DIAGNOSIS — D509 Iron deficiency anemia, unspecified: Secondary | ICD-10-CM | POA: Diagnosis not present

## 2019-08-28 DIAGNOSIS — D631 Anemia in chronic kidney disease: Secondary | ICD-10-CM | POA: Diagnosis not present

## 2019-08-30 DIAGNOSIS — Z23 Encounter for immunization: Secondary | ICD-10-CM | POA: Diagnosis not present

## 2019-08-30 DIAGNOSIS — N2581 Secondary hyperparathyroidism of renal origin: Secondary | ICD-10-CM | POA: Diagnosis not present

## 2019-08-30 DIAGNOSIS — N186 End stage renal disease: Secondary | ICD-10-CM | POA: Diagnosis not present

## 2019-08-30 DIAGNOSIS — Z992 Dependence on renal dialysis: Secondary | ICD-10-CM | POA: Diagnosis not present

## 2019-08-30 DIAGNOSIS — D509 Iron deficiency anemia, unspecified: Secondary | ICD-10-CM | POA: Diagnosis not present

## 2019-08-30 DIAGNOSIS — D631 Anemia in chronic kidney disease: Secondary | ICD-10-CM | POA: Diagnosis not present

## 2019-09-02 DIAGNOSIS — D631 Anemia in chronic kidney disease: Secondary | ICD-10-CM | POA: Diagnosis not present

## 2019-09-02 DIAGNOSIS — N186 End stage renal disease: Secondary | ICD-10-CM | POA: Diagnosis not present

## 2019-09-02 DIAGNOSIS — Z23 Encounter for immunization: Secondary | ICD-10-CM | POA: Diagnosis not present

## 2019-09-02 DIAGNOSIS — N2581 Secondary hyperparathyroidism of renal origin: Secondary | ICD-10-CM | POA: Diagnosis not present

## 2019-09-02 DIAGNOSIS — D509 Iron deficiency anemia, unspecified: Secondary | ICD-10-CM | POA: Diagnosis not present

## 2019-09-02 DIAGNOSIS — Z992 Dependence on renal dialysis: Secondary | ICD-10-CM | POA: Diagnosis not present

## 2019-09-03 DIAGNOSIS — N186 End stage renal disease: Secondary | ICD-10-CM | POA: Diagnosis not present

## 2019-09-03 DIAGNOSIS — Z992 Dependence on renal dialysis: Secondary | ICD-10-CM | POA: Diagnosis not present

## 2019-09-04 DIAGNOSIS — Z992 Dependence on renal dialysis: Secondary | ICD-10-CM | POA: Diagnosis not present

## 2019-09-04 DIAGNOSIS — N2581 Secondary hyperparathyroidism of renal origin: Secondary | ICD-10-CM | POA: Diagnosis not present

## 2019-09-04 DIAGNOSIS — N186 End stage renal disease: Secondary | ICD-10-CM | POA: Diagnosis not present

## 2019-09-04 DIAGNOSIS — D631 Anemia in chronic kidney disease: Secondary | ICD-10-CM | POA: Diagnosis not present

## 2019-09-04 DIAGNOSIS — D509 Iron deficiency anemia, unspecified: Secondary | ICD-10-CM | POA: Diagnosis not present

## 2019-09-06 DIAGNOSIS — D509 Iron deficiency anemia, unspecified: Secondary | ICD-10-CM | POA: Diagnosis not present

## 2019-09-06 DIAGNOSIS — N186 End stage renal disease: Secondary | ICD-10-CM | POA: Diagnosis not present

## 2019-09-06 DIAGNOSIS — D631 Anemia in chronic kidney disease: Secondary | ICD-10-CM | POA: Diagnosis not present

## 2019-09-06 DIAGNOSIS — Z992 Dependence on renal dialysis: Secondary | ICD-10-CM | POA: Diagnosis not present

## 2019-09-06 DIAGNOSIS — N2581 Secondary hyperparathyroidism of renal origin: Secondary | ICD-10-CM | POA: Diagnosis not present

## 2019-09-09 DIAGNOSIS — Z992 Dependence on renal dialysis: Secondary | ICD-10-CM | POA: Diagnosis not present

## 2019-09-09 DIAGNOSIS — D631 Anemia in chronic kidney disease: Secondary | ICD-10-CM | POA: Diagnosis not present

## 2019-09-09 DIAGNOSIS — D509 Iron deficiency anemia, unspecified: Secondary | ICD-10-CM | POA: Diagnosis not present

## 2019-09-09 DIAGNOSIS — N2581 Secondary hyperparathyroidism of renal origin: Secondary | ICD-10-CM | POA: Diagnosis not present

## 2019-09-09 DIAGNOSIS — N186 End stage renal disease: Secondary | ICD-10-CM | POA: Diagnosis not present

## 2019-09-11 DIAGNOSIS — D509 Iron deficiency anemia, unspecified: Secondary | ICD-10-CM | POA: Diagnosis not present

## 2019-09-11 DIAGNOSIS — N186 End stage renal disease: Secondary | ICD-10-CM | POA: Diagnosis not present

## 2019-09-11 DIAGNOSIS — N2581 Secondary hyperparathyroidism of renal origin: Secondary | ICD-10-CM | POA: Diagnosis not present

## 2019-09-11 DIAGNOSIS — Z992 Dependence on renal dialysis: Secondary | ICD-10-CM | POA: Diagnosis not present

## 2019-09-11 DIAGNOSIS — D631 Anemia in chronic kidney disease: Secondary | ICD-10-CM | POA: Diagnosis not present

## 2019-09-13 DIAGNOSIS — D509 Iron deficiency anemia, unspecified: Secondary | ICD-10-CM | POA: Diagnosis not present

## 2019-09-13 DIAGNOSIS — N186 End stage renal disease: Secondary | ICD-10-CM | POA: Diagnosis not present

## 2019-09-13 DIAGNOSIS — N2581 Secondary hyperparathyroidism of renal origin: Secondary | ICD-10-CM | POA: Diagnosis not present

## 2019-09-13 DIAGNOSIS — Z992 Dependence on renal dialysis: Secondary | ICD-10-CM | POA: Diagnosis not present

## 2019-09-13 DIAGNOSIS — D631 Anemia in chronic kidney disease: Secondary | ICD-10-CM | POA: Diagnosis not present

## 2019-09-16 DIAGNOSIS — N186 End stage renal disease: Secondary | ICD-10-CM | POA: Diagnosis not present

## 2019-09-16 DIAGNOSIS — D509 Iron deficiency anemia, unspecified: Secondary | ICD-10-CM | POA: Diagnosis not present

## 2019-09-16 DIAGNOSIS — D631 Anemia in chronic kidney disease: Secondary | ICD-10-CM | POA: Diagnosis not present

## 2019-09-16 DIAGNOSIS — N2581 Secondary hyperparathyroidism of renal origin: Secondary | ICD-10-CM | POA: Diagnosis not present

## 2019-09-16 DIAGNOSIS — Z992 Dependence on renal dialysis: Secondary | ICD-10-CM | POA: Diagnosis not present

## 2019-09-18 DIAGNOSIS — N2581 Secondary hyperparathyroidism of renal origin: Secondary | ICD-10-CM | POA: Diagnosis not present

## 2019-09-18 DIAGNOSIS — D509 Iron deficiency anemia, unspecified: Secondary | ICD-10-CM | POA: Diagnosis not present

## 2019-09-18 DIAGNOSIS — D631 Anemia in chronic kidney disease: Secondary | ICD-10-CM | POA: Diagnosis not present

## 2019-09-18 DIAGNOSIS — Z992 Dependence on renal dialysis: Secondary | ICD-10-CM | POA: Diagnosis not present

## 2019-09-18 DIAGNOSIS — N186 End stage renal disease: Secondary | ICD-10-CM | POA: Diagnosis not present

## 2019-09-20 DIAGNOSIS — Z992 Dependence on renal dialysis: Secondary | ICD-10-CM | POA: Diagnosis not present

## 2019-09-20 DIAGNOSIS — D631 Anemia in chronic kidney disease: Secondary | ICD-10-CM | POA: Diagnosis not present

## 2019-09-20 DIAGNOSIS — N186 End stage renal disease: Secondary | ICD-10-CM | POA: Diagnosis not present

## 2019-09-20 DIAGNOSIS — D509 Iron deficiency anemia, unspecified: Secondary | ICD-10-CM | POA: Diagnosis not present

## 2019-09-20 DIAGNOSIS — N2581 Secondary hyperparathyroidism of renal origin: Secondary | ICD-10-CM | POA: Diagnosis not present

## 2019-09-23 ENCOUNTER — Telehealth: Payer: Self-pay | Admitting: Family Medicine

## 2019-09-23 DIAGNOSIS — D509 Iron deficiency anemia, unspecified: Secondary | ICD-10-CM | POA: Diagnosis not present

## 2019-09-23 DIAGNOSIS — N186 End stage renal disease: Secondary | ICD-10-CM | POA: Diagnosis not present

## 2019-09-23 DIAGNOSIS — D631 Anemia in chronic kidney disease: Secondary | ICD-10-CM | POA: Diagnosis not present

## 2019-09-23 DIAGNOSIS — Z992 Dependence on renal dialysis: Secondary | ICD-10-CM | POA: Diagnosis not present

## 2019-09-23 DIAGNOSIS — N2581 Secondary hyperparathyroidism of renal origin: Secondary | ICD-10-CM | POA: Diagnosis not present

## 2019-09-24 NOTE — Telephone Encounter (Signed)
Faxed per request.

## 2019-09-25 DIAGNOSIS — N2581 Secondary hyperparathyroidism of renal origin: Secondary | ICD-10-CM | POA: Diagnosis not present

## 2019-09-25 DIAGNOSIS — N186 End stage renal disease: Secondary | ICD-10-CM | POA: Diagnosis not present

## 2019-09-25 DIAGNOSIS — D631 Anemia in chronic kidney disease: Secondary | ICD-10-CM | POA: Diagnosis not present

## 2019-09-25 DIAGNOSIS — D509 Iron deficiency anemia, unspecified: Secondary | ICD-10-CM | POA: Diagnosis not present

## 2019-09-25 DIAGNOSIS — Z992 Dependence on renal dialysis: Secondary | ICD-10-CM | POA: Diagnosis not present

## 2019-09-27 DIAGNOSIS — N186 End stage renal disease: Secondary | ICD-10-CM | POA: Diagnosis not present

## 2019-09-27 DIAGNOSIS — D509 Iron deficiency anemia, unspecified: Secondary | ICD-10-CM | POA: Diagnosis not present

## 2019-09-27 DIAGNOSIS — D631 Anemia in chronic kidney disease: Secondary | ICD-10-CM | POA: Diagnosis not present

## 2019-09-27 DIAGNOSIS — Z992 Dependence on renal dialysis: Secondary | ICD-10-CM | POA: Diagnosis not present

## 2019-09-27 DIAGNOSIS — N2581 Secondary hyperparathyroidism of renal origin: Secondary | ICD-10-CM | POA: Diagnosis not present

## 2019-09-30 DIAGNOSIS — D631 Anemia in chronic kidney disease: Secondary | ICD-10-CM | POA: Diagnosis not present

## 2019-09-30 DIAGNOSIS — D509 Iron deficiency anemia, unspecified: Secondary | ICD-10-CM | POA: Diagnosis not present

## 2019-09-30 DIAGNOSIS — N2581 Secondary hyperparathyroidism of renal origin: Secondary | ICD-10-CM | POA: Diagnosis not present

## 2019-09-30 DIAGNOSIS — N186 End stage renal disease: Secondary | ICD-10-CM | POA: Diagnosis not present

## 2019-09-30 DIAGNOSIS — Z992 Dependence on renal dialysis: Secondary | ICD-10-CM | POA: Diagnosis not present

## 2019-10-02 DIAGNOSIS — N2581 Secondary hyperparathyroidism of renal origin: Secondary | ICD-10-CM | POA: Diagnosis not present

## 2019-10-02 DIAGNOSIS — D631 Anemia in chronic kidney disease: Secondary | ICD-10-CM | POA: Diagnosis not present

## 2019-10-02 DIAGNOSIS — Z992 Dependence on renal dialysis: Secondary | ICD-10-CM | POA: Diagnosis not present

## 2019-10-02 DIAGNOSIS — D509 Iron deficiency anemia, unspecified: Secondary | ICD-10-CM | POA: Diagnosis not present

## 2019-10-02 DIAGNOSIS — N186 End stage renal disease: Secondary | ICD-10-CM | POA: Diagnosis not present

## 2019-10-04 DIAGNOSIS — N2581 Secondary hyperparathyroidism of renal origin: Secondary | ICD-10-CM | POA: Diagnosis not present

## 2019-10-04 DIAGNOSIS — D631 Anemia in chronic kidney disease: Secondary | ICD-10-CM | POA: Diagnosis not present

## 2019-10-04 DIAGNOSIS — D509 Iron deficiency anemia, unspecified: Secondary | ICD-10-CM | POA: Diagnosis not present

## 2019-10-04 DIAGNOSIS — Z992 Dependence on renal dialysis: Secondary | ICD-10-CM | POA: Diagnosis not present

## 2019-10-04 DIAGNOSIS — N186 End stage renal disease: Secondary | ICD-10-CM | POA: Diagnosis not present

## 2019-10-07 DIAGNOSIS — N2581 Secondary hyperparathyroidism of renal origin: Secondary | ICD-10-CM | POA: Diagnosis not present

## 2019-10-07 DIAGNOSIS — Z992 Dependence on renal dialysis: Secondary | ICD-10-CM | POA: Diagnosis not present

## 2019-10-07 DIAGNOSIS — N186 End stage renal disease: Secondary | ICD-10-CM | POA: Diagnosis not present

## 2019-10-07 DIAGNOSIS — D631 Anemia in chronic kidney disease: Secondary | ICD-10-CM | POA: Diagnosis not present

## 2019-10-07 DIAGNOSIS — D509 Iron deficiency anemia, unspecified: Secondary | ICD-10-CM | POA: Diagnosis not present

## 2019-10-09 DIAGNOSIS — N186 End stage renal disease: Secondary | ICD-10-CM | POA: Diagnosis not present

## 2019-10-09 DIAGNOSIS — Z992 Dependence on renal dialysis: Secondary | ICD-10-CM | POA: Diagnosis not present

## 2019-10-09 DIAGNOSIS — D631 Anemia in chronic kidney disease: Secondary | ICD-10-CM | POA: Diagnosis not present

## 2019-10-09 DIAGNOSIS — D509 Iron deficiency anemia, unspecified: Secondary | ICD-10-CM | POA: Diagnosis not present

## 2019-10-09 DIAGNOSIS — N2581 Secondary hyperparathyroidism of renal origin: Secondary | ICD-10-CM | POA: Diagnosis not present

## 2019-10-11 DIAGNOSIS — N186 End stage renal disease: Secondary | ICD-10-CM | POA: Diagnosis not present

## 2019-10-11 DIAGNOSIS — D509 Iron deficiency anemia, unspecified: Secondary | ICD-10-CM | POA: Diagnosis not present

## 2019-10-11 DIAGNOSIS — N2581 Secondary hyperparathyroidism of renal origin: Secondary | ICD-10-CM | POA: Diagnosis not present

## 2019-10-11 DIAGNOSIS — D631 Anemia in chronic kidney disease: Secondary | ICD-10-CM | POA: Diagnosis not present

## 2019-10-11 DIAGNOSIS — Z992 Dependence on renal dialysis: Secondary | ICD-10-CM | POA: Diagnosis not present

## 2019-10-14 ENCOUNTER — Other Ambulatory Visit: Payer: Self-pay

## 2019-10-14 DIAGNOSIS — D509 Iron deficiency anemia, unspecified: Secondary | ICD-10-CM | POA: Diagnosis not present

## 2019-10-14 DIAGNOSIS — D631 Anemia in chronic kidney disease: Secondary | ICD-10-CM | POA: Diagnosis not present

## 2019-10-14 DIAGNOSIS — N2581 Secondary hyperparathyroidism of renal origin: Secondary | ICD-10-CM | POA: Diagnosis not present

## 2019-10-14 DIAGNOSIS — N186 End stage renal disease: Secondary | ICD-10-CM | POA: Diagnosis not present

## 2019-10-14 DIAGNOSIS — Z992 Dependence on renal dialysis: Secondary | ICD-10-CM | POA: Diagnosis not present

## 2019-10-15 ENCOUNTER — Encounter: Payer: Self-pay | Admitting: Family Medicine

## 2019-10-15 ENCOUNTER — Ambulatory Visit (INDEPENDENT_AMBULATORY_CARE_PROVIDER_SITE_OTHER): Payer: Medicare Other | Admitting: Family Medicine

## 2019-10-15 VITALS — BP 118/59 | HR 68 | Temp 98.0°F | Ht 63.0 in | Wt 179.0 lb

## 2019-10-15 DIAGNOSIS — E1121 Type 2 diabetes mellitus with diabetic nephropathy: Secondary | ICD-10-CM

## 2019-10-15 DIAGNOSIS — Z992 Dependence on renal dialysis: Secondary | ICD-10-CM

## 2019-10-15 DIAGNOSIS — I5032 Chronic diastolic (congestive) heart failure: Secondary | ICD-10-CM | POA: Diagnosis not present

## 2019-10-15 DIAGNOSIS — N186 End stage renal disease: Secondary | ICD-10-CM | POA: Diagnosis not present

## 2019-10-15 DIAGNOSIS — I10 Essential (primary) hypertension: Secondary | ICD-10-CM

## 2019-10-15 DIAGNOSIS — E1159 Type 2 diabetes mellitus with other circulatory complications: Secondary | ICD-10-CM | POA: Diagnosis not present

## 2019-10-15 DIAGNOSIS — J441 Chronic obstructive pulmonary disease with (acute) exacerbation: Secondary | ICD-10-CM

## 2019-10-15 DIAGNOSIS — E1169 Type 2 diabetes mellitus with other specified complication: Secondary | ICD-10-CM

## 2019-10-15 DIAGNOSIS — E1122 Type 2 diabetes mellitus with diabetic chronic kidney disease: Secondary | ICD-10-CM | POA: Diagnosis not present

## 2019-10-15 DIAGNOSIS — E785 Hyperlipidemia, unspecified: Secondary | ICD-10-CM

## 2019-10-15 LAB — BAYER DCA HB A1C WAIVED: HB A1C (BAYER DCA - WAIVED): 7 % — ABNORMAL HIGH (ref <7.0)

## 2019-10-15 MED ORDER — PREDNISONE 10 MG (21) PO TBPK: ORAL_TABLET | ORAL | 0 refills | Status: DC

## 2019-10-15 MED ORDER — METHYLPREDNISOLONE ACETATE 40 MG/ML IJ SUSP: 40.0000 mg | Freq: Once | INTRAMUSCULAR | Status: DC

## 2019-10-15 MED ORDER — METHYLPREDNISOLONE ACETATE 80 MG/ML IJ SUSP
40.0000 mg | Freq: Once | INTRAMUSCULAR | Status: AC
  Administered 2019-10-15: 40 mg via INTRAMUSCULAR

## 2019-10-15 NOTE — Patient Instructions (Signed)

## 2019-10-15 NOTE — Progress Notes (Signed)
Subjective:  Patient ID: [name], female    DOB: 21-Oct-1951, 68 y.o.   MRN: [id number]  Patient Care Team: Baruch Gouty, FNP as PCP - General (Family Medicine) Fran Lowes, MD as Consulting Physician (Nephrology) [name], MD as Referring Physician (Family Medicine) Minus Breeding, MD as Consulting Physician (Cardiology) [facility]   Chief Complaint:  Medical Management of Chronic Issues and Diabetes   HPI: [name] is a 68 y.o. female presenting on 10/15/2019 for Medical Management of Chronic Issues and Diabetes  1. Type II diabetes mellitus with nephropathy ([place]) Doing well on current regimen. States last A1C at dialysis was 6.9. she denies polydipsia or polyphagia. No hypo- or hyperglycemic symptoms. No associated side effects from medications. Does try to watch diet and is active.   2. Hyperlipidemia associated with type 2 diabetes mellitus ([place]) On pravastatin and tolerating well. Does try to watch diet and stay active. No myalgias.   3. Hypertension associated with diabetes ([place]) Well controlled with current regimen. Does watch salt intake. Compliant with medications and dialysis. No associated side effects. No weight changes, leg swelling, or chest pain. No confusion, headaches, or weakness.   4. End stage renal disease on dialysis due to type 2 diabetes mellitus [facility]) Dialysis on Tuesday, Thursday, and Saturday. Tolerating well. Is receiving epogen 600 units and hectorol 4.5 mcg at every treatment.   5. COPD ([place]) Pt reports worsening exertional shortness of breath, cough, and wheezing. No increase in sputum production or viscosity. No fatigue. No fever, chills, weakness, or malaise. No chest pain or syncope. Symptoms improve with rest.   6. Chronic diastolic CHF (congestive heart failure) ([place]) No significant weight changes. No orthopnea or PND. No lower extremity swelling or lower extremity edema.       Relevant past medical, surgical, family, and social history reviewed and updated as indicated.  Allergies and medications reviewed and updated. Date reviewed: Chart in Epic.   Past Medical History:  Diagnosis Date  . Acute on chronic diastolic CHF (congestive heart failure) ([place]) 03/05/2015   Grade 2. EF 60-65%  . Anemia    hx of  . Anxiety   . Arthritis   . CAD (coronary artery disease)    s/p Stenting in 2005;  [place] 7/09: Vigorous LV function, 2-3+ MR, RI 40%, RI stent patent, proximal-mid RCA 40-50%;  [place] 7/09:  EF 55-65%, trivial MR;  [place] 11/18/12: EF 66%, normal LV wall motion, mild to moderate anterior ischemia - reviewed by [facility] and felt to rep breast atten (low risk);  Echo 6/14: mild LVH, EF 55-65%, Gr 2 DD, PASP 44, trivial eff   . Chronic kidney disease    CREATININE IS UP--GOING TO KIDNEY MD   . Chronic neck pain   . COPD (chronic obstructive pulmonary disease) ([place])   . Degenerative joint disease    left shoulder  . Diabetic neuropathy ([place])   . Diverticulosis   . Esophageal stricture   . GERD (gastroesophageal reflux disease)   . Hyperlipidemia   . Hypertension   . IDDM (insulin dependent diabetes mellitus)   . Pericardial effusion 03/06/2015   Very small per echo ; pleural nodules seen on CT chest.  . Peripheral vascular disease ([place])   . Stroke [facility])    "mini- years ago"  . Vitamin D deficiency     Past Surgical History:  Procedure Laterality Date  . A/V FISTULAGRAM N/A 08/10/2017   Procedure: A/V Fistulagram -  Right Arm;  Surgeon: Elam Dutch, MD;  Location: [facility];  Service: Cardiovascular;  Laterality: N/A;  . ABDOMINAL HYSTERECTOMY    . AV FISTULA PLACEMENT Right 07/20/2015   Procedure: RADIAL CEPHALIC ARTERIOVENOUS FISTULA CREATION RIGHT ARM;  Surgeon: Angelia Mould, MD;  Location: [facility];  Service: Vascular;  Laterality: Right;  . AV FISTULA PLACEMENT Right 11/26/2015   Procedure:  Creation of RIGHT BRACHIO-CEPHALIC arteriovenous  fistula;  Surgeon: Angelia Mould, MD;  Location: [facility];  Service: Vascular;  Laterality: Right;  . BACK SURGERY     2007 & 2013  . BLADDER SURGERY     Tack  . CARDIAC CATHETERIZATION  2003, 2005, 2009   1 stent  . cardiac stents    . CATARACT EXTRACTION W/PHACO Right 05/04/2014   Procedure: CATARACT EXTRACTION PHACO AND INTRAOCULAR LENS PLACEMENT (IOC);  Surgeon: Williams Che, MD;  Location: [facility];  Service: Ophthalmology;  Laterality: Right;  CDE 1.47  . CATARACT EXTRACTION W/PHACO Left 07/20/2014   Procedure: CATARACT EXTRACTION WITH PHACO AND INTRAOCULAR LENS PLACEMENT LEFT EYE CDE=1.79;  Surgeon: Williams Che, MD;  Location: [facility];  Service: Ophthalmology;  Laterality: Left;  . CERVICAL SPINE SURGERY  2012   8 screws  . CYSTOSCOPY WITH INJECTION N/A 06/03/2013   Procedure: MACROPLASTIQUE  INJECTION ;  Surgeon: Malka So, MD;  Location: [facility];  Service: Urology;  Laterality: N/A;  . FISTULOGRAM Right 10/20/2016   Procedure: FISTULOGRAM WITH POSSIBLE INTERVENTION;  Surgeon: [name], MD;  Location: [facility];  Service: Vascular;  Laterality: Right;  . INSERTION OF DIALYSIS CATHETER N/A 11/26/2015   Procedure: INSERTION OF DIALYSIS CATHETER;  Surgeon: Angelia Mould, MD;  Location: [facility];  Service: Vascular;  Laterality: N/A;  . LAMINECTOMY  12/29/2011  . LUMBAR LAMINECTOMY/DECOMPRESSION MICRODISCECTOMY  12/29/2011   Procedure: LUMBAR LAMINECTOMY/DECOMPRESSION MICRODISCECTOMY;  Surgeon: Floyce Stakes, MD;  Location: [facility];  Service: Neurosurgery;  Laterality: N/A;  Lumbar Two through Lumbar Five Laminectomies/Cellsaver  . PARTIAL HYSTERECTOMY    . PERIPHERAL VASCULAR CATHETERIZATION N/A 11/22/2015   Procedure: Fistulagram;  Surgeon: Angelia Mould, MD;  Location: [facility];  Service: Cardiovascular;  Laterality: N/A;  . PERIPHERAL VASCULAR INTERVENTION  08/10/2017   Procedure: PERIPHERAL VASCULAR INTERVENTION;  Surgeon: Elam Dutch,  MD;  Location: [facility];  Service: Cardiovascular;;  . SHOULDER SURGERY Right    Rotator cuff    Social History   Socioeconomic History  . Marital status: Widowed    Spouse name: Not on file  . Number of children: 2  . Years of education: GED  . Highest education level: GED or equivalent  Occupational History  . Occupation: Disabled  Social Needs  . Financial resource strain: Not very hard  . Food insecurity    Worry: Never true    Inability: Never true  . Transportation needs    Medical: No    Non-medical: No  Tobacco Use  . Smoking status: Former Smoker    Packs/day: 1.00    Years: 30.00    Pack years: 30.00    Quit date: 12/04/2001    Years since quitting: 17.8  . Smokeless tobacco: Never Used  Substance and Sexual Activity  . Alcohol use: No    Alcohol/week: 6.0 standard drinks    Types: 6 Cans of beer per week  . Drug use: No  . Sexual activity: Not Currently    Birth control/protection: Surgical  Lifestyle  . Physical activity    Days per week: 0 days    Minutes per session: 0 min  . Stress: Only a little  Relationships  . Social connections    Talks on phone: More than three times a week    Gets together: More than three times a week    Attends religious service: More than 4 times per year    Active member of club or organization: Yes    Attends meetings of clubs or organizations: More than 4 times per year    Relationship status: Widowed  . Intimate partner violence    Fear of current or ex partner: No    Emotionally abused: No    Physically abused: No    Forced sexual activity: No  Other Topics Concern  . Not on file  Social History Narrative   Lives in [place]    Outpatient Encounter Medications as of 10/15/2019  Medication Sig  . albuterol (PROVENTIL) (2.5 MG/3ML) 0.083% nebulizer solution Take 3 mLs (2.5 mg total) by nebulization every 6 (six) hours as needed for wheezing or shortness of breath.  [name] albuterol (VENTOLIN HFA) 108  (90 Base) MCG/ACT inhaler Inhale 2 puffs into the lungs every 6 (six) hours as needed for wheezing or shortness of breath.  [name] aspirin 81 MG EC tablet Take 81 mg by mouth daily.    [name] atenolol (TENORMIN) 50 MG tablet Take 1 tablet (50 mg total) by mouth 2 (two) times daily.  . betamethasone valerate (VALISONE) 0.1 % cream Apply topically 2 (two) times daily.  . budesonide-formoterol (SYMBICORT) 160-4.5 MCG/ACT inhaler Inhale 2 puffs into the lungs 2 (two) times daily as needed.  . calcium acetate (PHOSLO) 667 MG capsule Take 667-1,334 mg by mouth See admin instructions. Take 2 capsules with meals 3 times daily and 1 capsule with snacks  . Carboxymethylcellulose Sodium (THERATEARS OP) Place 2 drops into both eyes as needed (for dry eyes). Reported on 12/09/2015  . escitalopram (LEXAPRO) 20 MG tablet   . hydrALAZINE (APRESOLINE) 50 MG tablet Take 1 tablet (50 mg total) by mouth 2 (two) times daily.  . hydrOXYzine (VISTARIL) 25 MG capsule Take 1 capsule (25 mg total) by mouth 3 (three) times daily as needed.  . Insulin Glargine (LANTUS SOLOSTAR) 100 UNIT/ML Solostar Pen INJECT 15 TO 25 UNITS INTO THE SKIN DAILY AT 10 PM.  . NIFEdipine (ADALAT CC) 90 MG 24 hr tablet   . NIFEdipine (PROCARDIA XL/ADALAT-CC) 90 MG 24 hr tablet Take 90 mg by mouth every evening.   . nitroGLYCERIN (NITROSTAT) 0.4 MG SL tablet Place 1 tablet (0.4 mg total) under the tongue every 5 (five) minutes as needed for chest pain. (Patient not taking: Reported on 07/14/2019)  . pravastatin (PRAVACHOL) 20 MG tablet Take 1 tablet (20 mg total) by mouth daily.  . predniSONE (STERAPRED UNI-PAK 21 TAB) 10 MG (21) TBPK tablet As directed x 6 days  . torsemide (DEMADEX) 20 MG tablet 20 mg. Take two tablets by mouth once daily  . triamcinolone ointment (KENALOG) 0.5 % Apply 1 application topically 2 (two) times daily.  . [EXPIRED] methylPREDNISolone acetate (DEPO-MEDROL) injection 40 mg   . [DISCONTINUED] methylPREDNISolone acetate (DEPO-MEDROL)  injection 40 mg    No facility-administered encounter medications on file as of 10/15/2019.     Allergies  Allergen Reactions  . Azithromycin     Prolonged QT  . Ace Inhibitors Cough  . Clopidogrel Bisulfate Other (See Comments)    REACTION: Sick, Headache, "Felt  terrible"  . Codeine Other (See Comments)    REACTION: hallucinations  . Lisinopril Other (See Comments)    cough  . Penicillins Other (See Comments)    Has patient had a PCN reaction causing immediate rash, facial/tongue/throat swelling, SOB or lightheadedness with hypotension: unknown Has patient had a PCN reaction causing severe rash involving mucus membranes or skin necrosis: unknown Has patient had a PCN reaction that required hospitalization unknown Has patient had a PCN reaction occurring within the last 10 years:unknown If all of the above answers are "NO", then may proceed with Cephalosporin use.     Review of Systems  Constitutional: Negative for activity change, appetite change, chills, diaphoresis, fatigue, fever and unexpected weight change.  HENT: Negative.   Eyes: Negative.  Negative for photophobia and visual disturbance.  Respiratory: Positive for cough, shortness of breath and wheezing. Negative for chest tightness.   Cardiovascular: Negative for chest pain, palpitations and leg swelling.  Gastrointestinal: Negative for abdominal distention, abdominal pain, anal bleeding, blood in stool, constipation, diarrhea, nausea, rectal pain and vomiting.  Endocrine: Negative.  Negative for cold intolerance, heat intolerance, polydipsia and polyphagia.  Genitourinary: Negative for dysuria, frequency and urgency.  Musculoskeletal: Negative for arthralgias and myalgias.  Skin: Negative.  Negative for color change and pallor.  Allergic/Immunologic: Negative.   Neurological: Negative for dizziness, tremors, seizures, syncope, facial asymmetry, speech difficulty, weakness, light-headedness, numbness and headaches.   Hematological: Negative.  Does not bruise/bleed easily.  Psychiatric/Behavioral: Negative for confusion, hallucinations, sleep disturbance and suicidal ideas.  All other systems reviewed and are negative.       Objective:  BP (!) 118/59   Pulse 68   Temp 98 F (36.7 C) (Temporal)   Ht '5\' 3"'  (1.6 m)   Wt 179 lb (81.2 kg)   BMI 31.71 kg/m    Wt Readings from Last 3 Encounters:  10/15/19 179 lb (81.2 kg)  07/14/19 181 lb (82.1 kg)  04/05/19 175 lb (79.4 kg)    Physical Exam Vitals signs and nursing note reviewed.  Constitutional:      General: She is not in acute distress.    Appearance: Normal appearance. She is well-developed and well-groomed. She is not ill-appearing, toxic-appearing or diaphoretic.  HENT:     Head: Normocephalic and atraumatic.     Jaw: There is normal jaw occlusion.     Right Ear: Hearing normal.     Left Ear: Hearing normal.     Nose: Nose normal.     Mouth/Throat:     Lips: Pink.     Mouth: Mucous membranes are moist.     Pharynx: Oropharynx is clear. Uvula midline.  Eyes:     General: Lids are normal.     Extraocular Movements: Extraocular movements intact.     Conjunctiva/sclera: Conjunctivae normal.     Pupils: Pupils are equal, round, and reactive to light.  Neck:     Musculoskeletal: Normal range of motion and neck supple.     Thyroid: No thyroid mass, thyromegaly or thyroid tenderness.     Vascular: Carotid bruit (bilateral ) present. No JVD.     Trachea: Trachea and phonation normal.  Cardiovascular:     Rate and Rhythm: Normal rate and regular rhythm.     Chest Wall: PMI is not displaced.     Pulses: Normal pulses.     Heart sounds: Normal heart sounds. No murmur. No friction rub. No gallop.      Arteriovenous access: right arteriovenous access is present. Pulmonary:  Effort: Pulmonary effort is normal. No respiratory distress.     Breath sounds: Decreased air movement present. Wheezing (scattered. mild) present.  Abdominal:      General: Bowel sounds are normal. There is no distension or abdominal bruit.     Palpations: Abdomen is soft. There is no hepatomegaly or splenomegaly.     Tenderness: There is no abdominal tenderness. There is no right CVA tenderness or left CVA tenderness.     Hernia: No hernia is present.  Musculoskeletal: Normal range of motion.     Right lower leg: 1+ Edema present.     Left lower leg: 1+ Edema present.  Lymphadenopathy:     Cervical: No cervical adenopathy.  Skin:    General: Skin is warm and dry.     Capillary Refill: Capillary refill takes less than 2 seconds.     Coloration: Skin is not cyanotic, jaundiced or pale.     Findings: No rash.  Neurological:     General: No focal deficit present.     Mental Status: She is alert and oriented to person, place, and time.     Cranial Nerves: Cranial nerves are intact.     Sensory: Sensation is intact.     Motor: Motor function is intact.     Coordination: Coordination is intact.     Gait: Gait is intact.     Deep Tendon Reflexes: Reflexes are normal and symmetric.  Psychiatric:        Attention and Perception: Attention and perception normal.        Mood and Affect: Mood and affect normal.        Speech: Speech normal.        Behavior: Behavior normal. Behavior is cooperative.        Thought Content: Thought content normal.        Cognition and Memory: Cognition and memory normal.        Judgment: Judgment normal.     Results for orders placed or performed in visit on 10/15/19  CBC with Differential/Platelet  Result Value Ref Range   WBC 8.8 3.4 - 10.8 x10E3/uL   RBC 3.29 (L) 3.77 - 5.28 x10E6/uL   Hemoglobin 9.4 (L) 11.1 - 15.9 g/dL   Hematocrit 28.3 (L) 34.0 - 46.6 %   MCV 86 79 - 97 fL   MCH 28.6 26.6 - 33.0 pg   MCHC 33.2 31.5 - 35.7 g/dL   RDW 13.6 11.7 - 15.4 %   Platelets 194 150 - 450 x10E3/uL   Neutrophils 74 Not Estab. %   Lymphs 16 Not Estab. %   Monocytes 7 Not Estab. %   Eos 2 Not Estab. %   Basos 0 Not  Estab. %   Neutrophils Absolute 6.5 1.4 - 7.0 x10E3/uL   Lymphocytes Absolute 1.4 0.7 - 3.1 x10E3/uL   Monocytes Absolute 0.6 0.1 - 0.9 x10E3/uL   EOS (ABSOLUTE) 0.2 0.0 - 0.4 x10E3/uL   Basophils Absolute 0.0 0.0 - 0.2 x10E3/uL   Immature Granulocytes 1 Not Estab. %   Immature Grans (Abs) 0.0 0.0 - 0.1 x10E3/uL  CMP14+EGFR  Result Value Ref Range   Glucose 163 (H) 65 - 99 mg/dL   BUN 24 8 - 27 mg/dL   Creatinine, Ser 7.17 (HH) 0.57 - 1.00 mg/dL   GFR calc non Af Amer 5 (L) >59 mL/min/1.73   GFR calc Af Amer 6 (L) >59 mL/min/1.73   BUN/Creatinine Ratio 3 (L) 12 - 28   Sodium 138 134 -  144 mmol/L   Potassium 3.2 (L) 3.5 - 5.2 mmol/L   Chloride 94 (L) 96 - 106 mmol/L   CO2 26 20 - 29 mmol/L   Calcium 8.7 8.7 - 10.3 mg/dL   Total Protein 5.7 (L) 6.0 - 8.5 g/dL   Albumin 3.8 3.8 - 4.8 g/dL   Globulin, Total 1.9 1.5 - 4.5 g/dL   Albumin/Globulin Ratio 2.0 1.2 - 2.2   Bilirubin Total 0.3 0.0 - 1.2 mg/dL   Alkaline Phosphatase 64 39 - 117 IU/L   AST 28 0 - 40 IU/L   ALT 17 0 - 32 IU/L  Lipid panel  Result Value Ref Range   Cholesterol, Total 121 100 - 199 mg/dL   Triglycerides 164 (H) 0 - 149 mg/dL   HDL 24 (L) >39 mg/dL   VLDL Cholesterol Cal 28 5 - 40 mg/dL   LDL Chol Calc (NIH) 69 0 - 99 mg/dL   Chol/HDL Ratio 5.0 (H) 0.0 - 4.4 ratio  Bayer DCA Hb A1c Waived  Result Value Ref Range   HB A1C (BAYER DCA - WAIVED) 7.0 (H) <7.0 %       Pertinent labs & imaging results that were available during my care of the patient were reviewed by me and considered in my medical decision making.  Assessment & Plan:  Jezelle was seen today for medical management of chronic issues and diabetes.  Diagnoses and all orders for this visit:  Type II diabetes mellitus with nephropathy ([place]) A1C 7.0 today. Will not make adjustments in therapy. Pt aware to follow her diabetic diet guidelines and take medications as prescribed. Will recheck in 3 months and change therapy if warranted.  -     CBC  with Differential/Platelet -     Bayer DCA Hb A1c Waived  Hyperlipidemia associated with type 2 diabetes mellitus ([place]) Diet encouraged - increase intake of fresh fruits and vegetables, increase intake of lean proteins. Bake, broil, or grill foods. Avoid fried, greasy, and fatty foods. Avoid fast foods. Increase intake of fiber-rich whole grains. Exercise encouraged - at least 150 minutes per week and advance as tolerated.  Goal BMI < 25. Continue medications as prescribed. Follow up in 3-6 months as discussed.  -     CBC with Differential/Platelet -     Lipid panel  Hypertension associated with diabetes ([place]) Well controlled. DASH diet and exercise encouraged. -     CBC with Differential/Platelet -     CMP14+EGFR  End stage renal disease on dialysis due to type 2 diabetes mellitus ([place]) Dialysis three times per week and compliant with treatments.  -     CBC with Differential/Platelet -     CMP14+EGFR  COPD with acute exacerbation ([facility]) Ongoing cough and wheezing with exertional shortness of breath. No increased sputum production or viscosity. Antibiotics not indicated at this time. Pt aware to rpeort any new or worsening symptoms.  -     predniSONE (STERAPRED UNI-PAK 21 TAB) 10 MG (21) TBPK tablet; As directed x 6 days -     methylPREDNISolone acetate (DEPO-MEDROL) injection 40 mg  Chronic diastolic CHF (congestive heart failure) ([place]) Doing well with medications and 3 times weekly dialysis.     Continue all other maintenance medications.  Follow up plan: Return in about 3 months (around 01/15/2020) for DM.  Continue healthy lifestyle choices, including diet (rich in fruits, vegetables, and lean proteins, and low in salt and simple carbohydrates) and exercise (at least 30 minutes of moderate physical activity  daily).  Educational handout given for DM  The above assessment and management plan was discussed with the patient. The patient verbalized understanding of and has agreed to  the management plan. Patient is aware to call the clinic if they develop any new symptoms or if symptoms persist or worsen. Patient is aware when to return to the clinic for a follow-up visit. Patient educated on when it is appropriate to go to the emergency department.   Monia Pouch, FNP-C [facility] [phone]

## 2019-10-16 DIAGNOSIS — N186 End stage renal disease: Secondary | ICD-10-CM | POA: Diagnosis not present

## 2019-10-16 DIAGNOSIS — N2581 Secondary hyperparathyroidism of renal origin: Secondary | ICD-10-CM | POA: Diagnosis not present

## 2019-10-16 DIAGNOSIS — Z992 Dependence on renal dialysis: Secondary | ICD-10-CM | POA: Diagnosis not present

## 2019-10-16 DIAGNOSIS — D631 Anemia in chronic kidney disease: Secondary | ICD-10-CM | POA: Diagnosis not present

## 2019-10-16 DIAGNOSIS — D509 Iron deficiency anemia, unspecified: Secondary | ICD-10-CM | POA: Diagnosis not present

## 2019-10-16 LAB — LIPID PANEL
Chol/HDL Ratio: 5 ratio — ABNORMAL HIGH (ref 0.0–4.4)
Cholesterol, Total: 121 mg/dL (ref 100–199)
HDL: 24 mg/dL — ABNORMAL LOW (ref >39)
LDL Chol Calc (NIH): 69 mg/dL (ref 0–99)
Triglycerides: 164 mg/dL — ABNORMAL HIGH (ref 0–149)
VLDL Cholesterol Cal: 28 mg/dL (ref 5–40)

## 2019-10-16 LAB — CMP14+EGFR
ALT: 17 IU/L (ref 0–32)
AST: 28 IU/L (ref 0–40)
Albumin/Globulin Ratio: 2 (ref 1.2–2.2)
Albumin: 3.8 g/dL (ref 3.8–4.8)
Alkaline Phosphatase: 64 IU/L (ref 39–117)
BUN/Creatinine Ratio: 3 — ABNORMAL LOW (ref 12–28)
BUN: 24 mg/dL (ref 8–27)
Bilirubin Total: 0.3 mg/dL (ref 0.0–1.2)
CO2: 26 mmol/L (ref 20–29)
Calcium: 8.7 mg/dL (ref 8.7–10.3)
Chloride: 94 mmol/L — ABNORMAL LOW (ref 96–106)
Creatinine, Ser: 7.17 mg/dL (ref 0.57–1.00)
GFR calc Af Amer: 6 mL/min/{1.73_m2} — ABNORMAL LOW (ref >59)
GFR calc non Af Amer: 5 mL/min/{1.73_m2} — ABNORMAL LOW (ref >59)
Globulin, Total: 1.9 g/dL (ref 1.5–4.5)
Glucose: 163 mg/dL — ABNORMAL HIGH (ref 65–99)
Potassium: 3.2 mmol/L — ABNORMAL LOW (ref 3.5–5.2)
Sodium: 138 mmol/L (ref 134–144)
Total Protein: 5.7 g/dL — ABNORMAL LOW (ref 6.0–8.5)

## 2019-10-16 LAB — CBC WITH DIFFERENTIAL/PLATELET
Basophils Absolute: 0 10*3/uL (ref 0.0–0.2)
Basos: 0 %
EOS (ABSOLUTE): 0.2 10*3/uL (ref 0.0–0.4)
Eos: 2 %
Hematocrit: 28.3 % — ABNORMAL LOW (ref 34.0–46.6)
Hemoglobin: 9.4 g/dL — ABNORMAL LOW (ref 11.1–15.9)
Immature Grans (Abs): 0 10*3/uL (ref 0.0–0.1)
Immature Granulocytes: 1 %
Lymphocytes Absolute: 1.4 10*3/uL (ref 0.7–3.1)
Lymphs: 16 %
MCH: 28.6 pg (ref 26.6–33.0)
MCHC: 33.2 g/dL (ref 31.5–35.7)
MCV: 86 fL (ref 79–97)
Monocytes Absolute: 0.6 10*3/uL (ref 0.1–0.9)
Monocytes: 7 %
Neutrophils Absolute: 6.5 10*3/uL (ref 1.4–7.0)
Neutrophils: 74 %
Platelets: 194 10*3/uL (ref 150–450)
RBC: 3.29 x10E6/uL — ABNORMAL LOW (ref 3.77–5.28)
RDW: 13.6 % (ref 11.7–15.4)
WBC: 8.8 10*3/uL (ref 3.4–10.8)

## 2019-10-17 ENCOUNTER — Telehealth: Payer: Self-pay | Admitting: Family Medicine

## 2019-10-17 NOTE — Telephone Encounter (Signed)
This could be from poor circulation. She needs to have an ABI at next visit. Compression hose can be beneficial. Prop legs when sitting. Report any new or worsening symptoms.

## 2019-10-17 NOTE — Telephone Encounter (Signed)
Pt aware.

## 2019-10-18 DIAGNOSIS — Z992 Dependence on renal dialysis: Secondary | ICD-10-CM | POA: Diagnosis not present

## 2019-10-18 DIAGNOSIS — D509 Iron deficiency anemia, unspecified: Secondary | ICD-10-CM | POA: Diagnosis not present

## 2019-10-18 DIAGNOSIS — N2581 Secondary hyperparathyroidism of renal origin: Secondary | ICD-10-CM | POA: Diagnosis not present

## 2019-10-18 DIAGNOSIS — D631 Anemia in chronic kidney disease: Secondary | ICD-10-CM | POA: Diagnosis not present

## 2019-10-18 DIAGNOSIS — N186 End stage renal disease: Secondary | ICD-10-CM | POA: Diagnosis not present

## 2019-10-21 DIAGNOSIS — N2581 Secondary hyperparathyroidism of renal origin: Secondary | ICD-10-CM | POA: Diagnosis not present

## 2019-10-21 DIAGNOSIS — Z992 Dependence on renal dialysis: Secondary | ICD-10-CM | POA: Diagnosis not present

## 2019-10-21 DIAGNOSIS — N186 End stage renal disease: Secondary | ICD-10-CM | POA: Diagnosis not present

## 2019-10-21 DIAGNOSIS — D509 Iron deficiency anemia, unspecified: Secondary | ICD-10-CM | POA: Diagnosis not present

## 2019-10-21 DIAGNOSIS — D631 Anemia in chronic kidney disease: Secondary | ICD-10-CM | POA: Diagnosis not present

## 2019-10-23 ENCOUNTER — Other Ambulatory Visit: Payer: Self-pay

## 2019-10-23 DIAGNOSIS — N186 End stage renal disease: Secondary | ICD-10-CM | POA: Diagnosis not present

## 2019-10-23 DIAGNOSIS — D631 Anemia in chronic kidney disease: Secondary | ICD-10-CM | POA: Diagnosis not present

## 2019-10-23 DIAGNOSIS — Z992 Dependence on renal dialysis: Secondary | ICD-10-CM | POA: Diagnosis not present

## 2019-10-23 DIAGNOSIS — I739 Peripheral vascular disease, unspecified: Secondary | ICD-10-CM

## 2019-10-23 DIAGNOSIS — D509 Iron deficiency anemia, unspecified: Secondary | ICD-10-CM | POA: Diagnosis not present

## 2019-10-23 DIAGNOSIS — N2581 Secondary hyperparathyroidism of renal origin: Secondary | ICD-10-CM | POA: Diagnosis not present

## 2019-10-25 DIAGNOSIS — N186 End stage renal disease: Secondary | ICD-10-CM | POA: Diagnosis not present

## 2019-10-25 DIAGNOSIS — D509 Iron deficiency anemia, unspecified: Secondary | ICD-10-CM | POA: Diagnosis not present

## 2019-10-25 DIAGNOSIS — D631 Anemia in chronic kidney disease: Secondary | ICD-10-CM | POA: Diagnosis not present

## 2019-10-25 DIAGNOSIS — N2581 Secondary hyperparathyroidism of renal origin: Secondary | ICD-10-CM | POA: Diagnosis not present

## 2019-10-25 DIAGNOSIS — Z992 Dependence on renal dialysis: Secondary | ICD-10-CM | POA: Diagnosis not present

## 2019-10-28 ENCOUNTER — Other Ambulatory Visit: Payer: Self-pay

## 2019-10-28 DIAGNOSIS — Z20828 Contact with and (suspected) exposure to other viral communicable diseases: Secondary | ICD-10-CM | POA: Diagnosis not present

## 2019-10-28 DIAGNOSIS — Z992 Dependence on renal dialysis: Secondary | ICD-10-CM | POA: Diagnosis not present

## 2019-10-28 DIAGNOSIS — N2581 Secondary hyperparathyroidism of renal origin: Secondary | ICD-10-CM | POA: Diagnosis not present

## 2019-10-28 DIAGNOSIS — D631 Anemia in chronic kidney disease: Secondary | ICD-10-CM | POA: Diagnosis not present

## 2019-10-28 DIAGNOSIS — D509 Iron deficiency anemia, unspecified: Secondary | ICD-10-CM | POA: Diagnosis not present

## 2019-10-28 DIAGNOSIS — N186 End stage renal disease: Secondary | ICD-10-CM | POA: Diagnosis not present

## 2019-10-28 DIAGNOSIS — Z20822 Contact with and (suspected) exposure to covid-19: Secondary | ICD-10-CM

## 2019-10-29 ENCOUNTER — Encounter: Payer: Medicare Other | Admitting: Vascular Surgery

## 2019-10-29 ENCOUNTER — Ambulatory Visit (HOSPITAL_COMMUNITY): Payer: Medicare Other

## 2019-10-29 ENCOUNTER — Other Ambulatory Visit: Payer: Self-pay

## 2019-10-29 LAB — NOVEL CORONAVIRUS, NAA: SARS-CoV-2, NAA: DETECTED — AB

## 2019-11-01 DIAGNOSIS — D631 Anemia in chronic kidney disease: Secondary | ICD-10-CM | POA: Diagnosis not present

## 2019-11-01 DIAGNOSIS — N186 End stage renal disease: Secondary | ICD-10-CM | POA: Diagnosis not present

## 2019-11-01 DIAGNOSIS — N2581 Secondary hyperparathyroidism of renal origin: Secondary | ICD-10-CM | POA: Diagnosis not present

## 2019-11-01 DIAGNOSIS — Z992 Dependence on renal dialysis: Secondary | ICD-10-CM | POA: Diagnosis not present

## 2019-11-01 DIAGNOSIS — D509 Iron deficiency anemia, unspecified: Secondary | ICD-10-CM | POA: Diagnosis not present

## 2019-11-03 DIAGNOSIS — Z992 Dependence on renal dialysis: Secondary | ICD-10-CM | POA: Diagnosis not present

## 2019-11-03 DIAGNOSIS — N186 End stage renal disease: Secondary | ICD-10-CM | POA: Diagnosis not present

## 2019-11-04 DIAGNOSIS — N2581 Secondary hyperparathyroidism of renal origin: Secondary | ICD-10-CM | POA: Diagnosis not present

## 2019-11-04 DIAGNOSIS — D631 Anemia in chronic kidney disease: Secondary | ICD-10-CM | POA: Diagnosis not present

## 2019-11-04 DIAGNOSIS — D509 Iron deficiency anemia, unspecified: Secondary | ICD-10-CM | POA: Diagnosis not present

## 2019-11-04 DIAGNOSIS — Z992 Dependence on renal dialysis: Secondary | ICD-10-CM | POA: Diagnosis not present

## 2019-11-04 DIAGNOSIS — N186 End stage renal disease: Secondary | ICD-10-CM | POA: Diagnosis not present

## 2019-11-25 ENCOUNTER — Other Ambulatory Visit: Payer: Self-pay | Admitting: Family Medicine

## 2019-12-02 DIAGNOSIS — E119 Type 2 diabetes mellitus without complications: Secondary | ICD-10-CM | POA: Diagnosis not present

## 2019-12-02 DIAGNOSIS — Z992 Dependence on renal dialysis: Secondary | ICD-10-CM | POA: Diagnosis not present

## 2019-12-02 DIAGNOSIS — Z794 Long term (current) use of insulin: Secondary | ICD-10-CM | POA: Diagnosis not present

## 2019-12-04 DIAGNOSIS — Z992 Dependence on renal dialysis: Secondary | ICD-10-CM | POA: Diagnosis not present

## 2019-12-04 DIAGNOSIS — N186 End stage renal disease: Secondary | ICD-10-CM | POA: Diagnosis not present

## 2019-12-06 DIAGNOSIS — D509 Iron deficiency anemia, unspecified: Secondary | ICD-10-CM | POA: Diagnosis not present

## 2019-12-06 DIAGNOSIS — Z992 Dependence on renal dialysis: Secondary | ICD-10-CM | POA: Diagnosis not present

## 2019-12-06 DIAGNOSIS — D631 Anemia in chronic kidney disease: Secondary | ICD-10-CM | POA: Diagnosis not present

## 2019-12-06 DIAGNOSIS — N2581 Secondary hyperparathyroidism of renal origin: Secondary | ICD-10-CM | POA: Diagnosis not present

## 2019-12-06 DIAGNOSIS — N186 End stage renal disease: Secondary | ICD-10-CM | POA: Diagnosis not present

## 2019-12-29 ENCOUNTER — Other Ambulatory Visit: Payer: Self-pay | Admitting: Family Medicine

## 2019-12-29 DIAGNOSIS — E1169 Type 2 diabetes mellitus with other specified complication: Secondary | ICD-10-CM

## 2019-12-30 DIAGNOSIS — Z992 Dependence on renal dialysis: Secondary | ICD-10-CM | POA: Diagnosis not present

## 2019-12-31 ENCOUNTER — Ambulatory Visit (INDEPENDENT_AMBULATORY_CARE_PROVIDER_SITE_OTHER): Payer: Medicare Other | Admitting: Vascular Surgery

## 2019-12-31 ENCOUNTER — Encounter: Payer: Self-pay | Admitting: Vascular Surgery

## 2019-12-31 ENCOUNTER — Other Ambulatory Visit: Payer: Self-pay

## 2019-12-31 ENCOUNTER — Ambulatory Visit (HOSPITAL_COMMUNITY)
Admission: RE | Admit: 2019-12-31 | Discharge: 2019-12-31 | Disposition: A | Discharge status: Home / Self Care | Payer: Medicare Other | Source: Ambulatory Visit | Attending: Vascular Surgery | Admitting: Vascular Surgery

## 2019-12-31 VITALS — BP 140/85 | HR 56 | Temp 97.1°F | Resp 14 | Ht 63.0 in | Wt 173.0 lb

## 2019-12-31 DIAGNOSIS — I739 Peripheral vascular disease, unspecified: Secondary | ICD-10-CM

## 2019-12-31 NOTE — Progress Notes (Signed)
REASON FOR CONSULT:    Peripheral vascular disease.  The consult is requested by Dr. Holley Raring.  ASSESSMENT & PLAN:   PERIPHERAL VASCULAR DISEASE: This patient does have evidence of multilevel arterial occlusive disease.  She likely has inflow disease on the right and infrainguinal arterial occlusive disease bilaterally.  Her ABIs are reasonable although these may be falsely elevated because of calcific disease.  However her toe pressures are decent and I do not think she has critical limb ischemia.  I explained that we would normally only consider arteriography and intervention if she developed rest pain or nonhealing ulcer.  Certainly if her symptoms become disabling we could consider arteriography.  However given her multiple medical comorbidities I would favor a conservative approach initially.  She does develop some pain in her legs on dialysis but I am not totally convinced this is related to her peripheral vascular disease.  If the symptoms progress then certainly we could reconsider arteriography.  Fortunately she quit smoking in 2003.  We have discussed the importance of walking daily and how this helps develop collaterals and may improve her symptoms.  In addition we discussed the importance of nutrition.  I have ordered follow-up ABIs in 6 months and I will see her back at that time.  She knows to call sooner if her symptoms progress.   Deitra Mayo, MD Office: [phone]   HPI:   [name] is a pleasant 69 y.o. female, who presents for evaluation of claudication.  She states that she has had claudication in both calves for many years.  Her symptoms are equal on both sides.  She gets pain in the calves which is brought on by ambulation and relieved with rest.  She denies any history of rest pain or nonhealing ulcers.  She does admit to developing pain in her legs when she is on dialysis.  She states that she normally dialyzes with sitting in a chair.  This usually occurs late  during her dialysis session.  Risk factors for peripheral vascular disease include diabetes, hypertension, and tobacco use.  She quit tobacco in 2003.  She denies any family history of premature cardiovascular disease or hypercholesterolemia.  She does have multiple medical comorbidities including COPD, chronic diastolic congestive heart failure, and coronary artery disease.  She is undergone previous PTCA.  She is on dialysis Tuesdays Thursdays and Saturdays.  She has a functioning right upper arm fistula.  Past Medical History:  Diagnosis Date  . Acute on chronic diastolic CHF (congestive heart failure) ([place]) 03/05/2015   Grade 2. EF 60-65%  . Anemia    hx of  . Anxiety   . Arthritis   . CAD (coronary artery disease)    s/p Stenting in 2005;  [place] 7/09: Vigorous LV function, 2-3+ MR, RI 40%, RI stent patent, proximal-mid RCA 40-50%;  Echo 7/09:  EF 55-65%, trivial MR;  Myoview 11/18/12: EF 66%, normal LV wall motion, mild to moderate anterior ischemia - reviewed by [facility] and felt to rep breast atten (low risk);  Echo 6/14: mild LVH, EF 55-65%, Gr 2 DD, PASP 44, trivial eff   . Chronic kidney disease    CREATININE IS UP--GOING TO KIDNEY MD   . Chronic neck pain   . COPD (chronic obstructive pulmonary disease) ([place])   . Degenerative joint disease    left shoulder  . Diabetic neuropathy ([place])   . Diverticulosis   . Esophageal stricture   . GERD (gastroesophageal reflux disease)   . Hyperlipidemia   .  Hypertension   . IDDM (insulin dependent diabetes mellitus)   . Pericardial effusion 03/06/2015   Very small per echo ; pleural nodules seen on CT chest.  . Peripheral vascular disease ([place])   . Stroke [facility])    "mini- years ago"  . Vitamin D deficiency     Family History  Problem Relation Age of Onset  . Heart disease Other   . Hypertension Father   . Cancer Father   . Hypertension Daughter   . Pancreatitis Brother   . Healthy Daughter     SOCIAL HISTORY: Social History    Socioeconomic History  . Marital status: Widowed    Spouse name: Not on file  . Number of children: 2  . Years of education: GED  . Highest education level: GED or equivalent  Occupational History  . Occupation: Disabled  Tobacco Use  . Smoking status: Former Smoker    Packs/day: 1.00    Years: 30.00    Pack years: 30.00    Quit date: 12/04/2001    Years since quitting: 18.0  . Smokeless tobacco: Never Used  Substance and Sexual Activity  . Alcohol use: No    Alcohol/week: 6.0 standard drinks    Types: 6 Cans of beer per week  . Drug use: No  . Sexual activity: Not Currently    Birth control/protection: Surgical  Other Topics Concern  . Not on file  Social History Narrative   Lives in [place] Determinants of Health   Financial Resource Strain:   . Difficulty of Paying Living Expenses: Not on file  Food Insecurity:   . Worried About Charity fundraiser in the Last Year: Not on file  . Ran Out of Food in the Last Year: Not on file  Transportation Needs:   . Lack of Transportation (Medical): Not on file  . Lack of Transportation (Non-Medical): Not on file  Physical Activity:   . Days of Exercise per Week: Not on file  . Minutes of Exercise per Session: Not on file  Stress:   . Feeling of Stress : Not on file  Social Connections:   . Frequency of Communication with Friends and Family: Not on file  . Frequency of Social Gatherings with Friends and Family: Not on file  . Attends Religious Services: Not on file  . Active Member of Clubs or Organizations: Not on file  . Attends Archivist Meetings: Not on file  . Marital Status: Not on file  Intimate Partner Violence:   . Fear of Current or Ex-Partner: Not on file  . Emotionally Abused: Not on file  . Physically Abused: Not on file  . Sexually Abused: Not on file    Allergies  Allergen Reactions  . Azithromycin     Prolonged QT  . Ace Inhibitors Cough  . Clopidogrel Bisulfate Other (See  Comments)    REACTION: Sick, Headache, "Felt terrible"  . Codeine Other (See Comments)    REACTION: hallucinations  . Lisinopril Other (See Comments)    cough  . Penicillins Other (See Comments)    Has patient had a PCN reaction causing immediate rash, facial/tongue/throat swelling, SOB or lightheadedness with hypotension: unknown Has patient had a PCN reaction causing severe rash involving mucus membranes or skin necrosis: unknown Has patient had a PCN reaction that required hospitalization unknown Has patient had a PCN reaction occurring within the last 10 years:unknown If all of the above answers are "NO", then may proceed with Cephalosporin use.  Current Outpatient Medications  Medication Sig Dispense Refill  . albuterol (PROVENTIL) (2.5 MG/3ML) 0.083% nebulizer solution Take 3 mLs (2.5 mg total) by nebulization every 6 (six) hours as needed for wheezing or shortness of breath. 75 mL 12  . albuterol (VENTOLIN HFA) 108 (90 Base) MCG/ACT inhaler Inhale 2 puffs into the lungs every 6 (six) hours as needed for wheezing or shortness of breath. 1 Inhaler 2  . aspirin 81 MG EC tablet Take 81 mg by mouth daily.      [name] atenolol (TENORMIN) 50 MG tablet Take 1 tablet (50 mg total) by mouth 2 (two) times daily. 60 tablet 4  . betamethasone valerate (VALISONE) 0.1 % cream Apply topically 2 (two) times daily. 30 g 0  . calcium acetate (PHOSLO) 667 MG capsule Take 667-1,334 mg by mouth See admin instructions. Take 2 capsules with meals 3 times daily and 1 capsule with snacks    . Carboxymethylcellulose Sodium (THERATEARS OP) Place 2 drops into both eyes as needed (for dry eyes). Reported on 12/09/2015    . escitalopram (LEXAPRO) 20 MG tablet TAKE 1 TABLET ONCE DAILY. 30 tablet 1  . hydrOXYzine (VISTARIL) 25 MG capsule Take 1 capsule (25 mg total) by mouth 3 (three) times daily as needed. 60 capsule 1  . Insulin Glargine (LANTUS SOLOSTAR) 100 UNIT/ML Solostar Pen INJECT 15 TO 25 UNITS INTO THE SKIN  DAILY AT 10 PM. 15 mL 4  . NIFEdipine (ADALAT CC) 90 MG 24 hr tablet     . NIFEdipine (PROCARDIA XL/ADALAT-CC) 90 MG 24 hr tablet Take 90 mg by mouth every evening.     . nitroGLYCERIN (NITROSTAT) 0.4 MG SL tablet Place 1 tablet (0.4 mg total) under the tongue every 5 (five) minutes as needed for chest pain. 25 tablet 5  . pravastatin (PRAVACHOL) 20 MG tablet Take 1 tablet by mouth once daily 90 tablet 0  . predniSONE (STERAPRED UNI-PAK 21 TAB) 10 MG (21) TBPK tablet As directed x 6 days 21 tablet 0  . torsemide (DEMADEX) 20 MG tablet 20 mg. Take two tablets by mouth once daily    . triamcinolone ointment (KENALOG) 0.5 % Apply 1 application topically 2 (two) times daily. 60 g 0  . budesonide-formoterol (SYMBICORT) 160-4.5 MCG/ACT inhaler Inhale 2 puffs into the lungs 2 (two) times daily as needed. 1 Inhaler 3  . hydrALAZINE (APRESOLINE) 50 MG tablet Take 1 tablet (50 mg total) by mouth 2 (two) times daily. 60 tablet 3   No current facility-administered medications for this visit.    REVIEW OF SYSTEMS:  [X]  denotes positive finding, [ ]  denotes negative finding Cardiac  Comments:  Chest pain or chest pressure:    Shortness of breath upon exertion: x   Short of breath when lying flat:    Irregular heart rhythm:        Vascular    Pain in calf, thigh, or hip brought on by ambulation: x   Pain in feet at night that wakes you up from your sleep:     Blood clot in your veins:    Leg swelling:         Pulmonary    Oxygen at home:    Productive cough:     Wheezing:  x       Neurologic    Sudden weakness in arms or legs:     Sudden numbness in arms or legs:     Sudden onset of difficulty speaking or slurred speech:    Temporary  loss of vision in one eye:     Problems with dizziness:         Gastrointestinal    Blood in stool:     Vomited blood:         Genitourinary    Burning when urinating:     Blood in urine:        Psychiatric    Major depression:         Hematologic      Bleeding problems:    Problems with blood clotting too easily:        Skin    Rashes or ulcers:        Constitutional    Fever or chills:     PHYSICAL EXAM:   Vitals:   12/31/19 0943  BP: 140/85  Pulse: (!) 56  Resp: 14  Temp: (!) 97.1 F (36.2 C)  TempSrc: Temporal  SpO2: 94%  Weight: 173 lb (78.5 kg)  Height: 5\' 3"  (1.6 m)    GENERAL: The patient is a well-nourished female, in no acute distress. The vital signs are documented above. CARDIAC: There is a regular rate and rhythm.  VASCULAR: I do not detect carotid bruits. On the right side I cannot palpate a femoral, popliteal, or pedal pulses. On the left side she has a palpable femoral pulse.  I cannot palpate popliteal or pedal pulses. She has no lower extremity swelling. PULMONARY: There is good air exchange bilaterally without wheezing or rales. ABDOMEN: Soft and non-tender with normal pitched bowel sounds.  I do not palpate an abdominal aortic aneurysm. MUSCULOSKELETAL: There are no major deformities or cyanosis. NEUROLOGIC: No focal weakness or paresthesias are detected. SKIN: There are no ulcers or rashes noted. PSYCHIATRIC: The patient has a normal affect.  DATA:    ARTERIAL DOPPLER STUDY: I have independently interpreted her arterial Doppler study today.  On the right side there is a monophasic dorsalis pedis and posterior tibial signal.  ABI is 84%.  Toe pressure is 75 mmHg.  On the left side there is a monophasic dorsalis pedis and posterior tibial signal.  ABI is 73%.  Toe pressure is 97 mmHg.

## 2020-01-01 ENCOUNTER — Other Ambulatory Visit: Payer: Self-pay | Admitting: *Deleted

## 2020-01-01 DIAGNOSIS — I739 Peripheral vascular disease, unspecified: Secondary | ICD-10-CM

## 2020-01-04 DIAGNOSIS — Z992 Dependence on renal dialysis: Secondary | ICD-10-CM | POA: Diagnosis not present

## 2020-01-04 DIAGNOSIS — N186 End stage renal disease: Secondary | ICD-10-CM | POA: Diagnosis not present

## 2020-01-05 DIAGNOSIS — Z7189 Other specified counseling: Secondary | ICD-10-CM | POA: Insufficient documentation

## 2020-01-05 DIAGNOSIS — R0989 Other specified symptoms and signs involving the circulatory and respiratory systems: Secondary | ICD-10-CM | POA: Insufficient documentation

## 2020-01-05 NOTE — Progress Notes (Signed)
Cardiology Office Note   Date:  01/07/2020   ID:  ETHNA KONZ, DOB 05-30-1951, MRN [id number]  PCP:  Baruch Gouty, FNP  Cardiologist:   No primary care provider on file.   Chief Complaint  Patient presents with  . Chest Pain      History of Present Illness: [name] is a 69 y.o. female who presents for follow up of CAD and diastolic heart failure with diastolic dysfunction.   Since I last saw her she has done relatively well.  Her biggest complaint is when she cramps when we have to pull too much fluid on dialysis.  She otherwise does okay.  She does not do a lot of physical activity although she will do stationary go to the grocery store.  She has had some leg pain and actually saw Dr. Scot Dock recently.  She does have peripheral vascular disease I reviewed his notes with this plan with her reduced ABIs he is to manage her conservatively with increased walking.  The only time she gets chest discomfort is if she is on dialysis and they are pulling a lot of fluid.  She otherwise with her usual activities does not get chest pressure, neck or arm discomfort.  She does not have any new shortness of breath, PND or orthopnea.  She is not having any new palpitations, presyncope or syncope.  She has had no new weight gain or edema.  She is anxious and hoping to get treatment for this.  Past Medical History:  Diagnosis Date  . Acute on chronic diastolic CHF (congestive heart failure) ([place]) 03/05/2015   Grade 2. EF 60-65%  . Anemia    hx of  . Anxiety   . Arthritis   . CAD (coronary artery disease)    s/p Stenting in 2005;  [place] 7/09: Vigorous LV function, 2-3+ MR, RI 40%, RI stent patent, proximal-mid RCA 40-50%;  [place] 7/09:  EF 55-65%, trivial MR;  Myoview 11/18/12: EF 66%, normal LV wall motion, mild to moderate anterior ischemia - reviewed by [facility] and felt to rep breast atten (low risk);  Echo 6/14: mild LVH, EF 55-65%, Gr 2 DD, PASP 44, trivial eff   . Chronic kidney disease    CREATININE IS UP--GOING TO KIDNEY MD   . Chronic neck pain   . COPD (chronic obstructive pulmonary disease) ([place])   . Degenerative joint disease    left shoulder  . Diabetic neuropathy ([place])   . Diverticulosis   . Esophageal stricture   . GERD (gastroesophageal reflux disease)   . Hyperlipidemia   . Hypertension   . IDDM (insulin dependent diabetes mellitus)   . Pericardial effusion 03/06/2015   Very small per echo ; pleural nodules seen on CT chest.  . Peripheral vascular disease ([place])   . Stroke [facility])    "mini- years ago"  . Vitamin D deficiency     Past Surgical History:  Procedure Laterality Date  . A/V FISTULAGRAM N/A 08/10/2017   Procedure: A/V Fistulagram - Right Arm;  Surgeon: Elam Dutch, MD;  Location: [place] CV [facility];  Service: Cardiovascular;  Laterality: N/A;  . ABDOMINAL HYSTERECTOMY    . AV FISTULA PLACEMENT Right 07/20/2015   Procedure: RADIAL CEPHALIC ARTERIOVENOUS FISTULA CREATION RIGHT ARM;  Surgeon: Angelia Mould, MD;  Location: [place];  Service: Vascular;  Laterality: Right;  . AV FISTULA PLACEMENT Right 11/26/2015   Procedure:  Creation of RIGHT BRACHIO-CEPHALIC arteriovenous fistula;  Surgeon: Angelia Mould,  MD;  Location: [place];  Service: Vascular;  Laterality: Right;  . BACK SURGERY     2007 & 2013  . BLADDER SURGERY     Tack  . CARDIAC CATHETERIZATION  2003, 2005, 2009   1 stent  . cardiac stents    . CATARACT EXTRACTION W/PHACO Right 05/04/2014   Procedure: CATARACT EXTRACTION PHACO AND INTRAOCULAR LENS PLACEMENT (IOC);  Surgeon: Williams Che, MD;  Location: [facility];  Service: Ophthalmology;  Laterality: Right;  CDE 1.47  . CATARACT EXTRACTION W/PHACO Left 07/20/2014   Procedure: CATARACT EXTRACTION WITH PHACO AND INTRAOCULAR LENS PLACEMENT LEFT EYE CDE=1.79;  Surgeon: Williams Che, MD;  Location: [facility];  Service: Ophthalmology;  Laterality: Left;  . CERVICAL SPINE SURGERY  2012   8 screws  . CYSTOSCOPY WITH INJECTION N/A  06/03/2013   Procedure: MACROPLASTIQUE  INJECTION ;  Surgeon: Malka So, MD;  Location: [facility];  Service: Urology;  Laterality: N/A;  . FISTULOGRAM Right 10/20/2016   Procedure: FISTULOGRAM WITH POSSIBLE INTERVENTION;  Surgeon: [name], MD;  Location: [facility];  Service: Vascular;  Laterality: Right;  . INSERTION OF DIALYSIS CATHETER N/A 11/26/2015   Procedure: INSERTION OF DIALYSIS CATHETER;  Surgeon: Angelia Mould, MD;  Location: [place];  Service: Vascular;  Laterality: N/A;  . LAMINECTOMY  12/29/2011  . LUMBAR LAMINECTOMY/DECOMPRESSION MICRODISCECTOMY  12/29/2011   Procedure: LUMBAR LAMINECTOMY/DECOMPRESSION MICRODISCECTOMY;  Surgeon: Floyce Stakes, MD;  Location: [place] [facility];  Service: Neurosurgery;  Laterality: N/A;  Lumbar Two through Lumbar Five Laminectomies/Cellsaver  . PARTIAL HYSTERECTOMY    . PERIPHERAL VASCULAR CATHETERIZATION N/A 11/22/2015   Procedure: Fistulagram;  Surgeon: Angelia Mould, MD;  Location: [place] [facility];  Service: Cardiovascular;  Laterality: N/A;  . PERIPHERAL VASCULAR INTERVENTION  08/10/2017   Procedure: PERIPHERAL VASCULAR INTERVENTION;  Surgeon: Elam Dutch, MD;  Location: [place] [facility];  Service: Cardiovascular;;  . SHOULDER SURGERY Right    Rotator cuff     Current Outpatient Medications  Medication Sig Dispense Refill  . albuterol (PROVENTIL) (2.5 MG/3ML) 0.083% nebulizer solution Take 3 mLs (2.5 mg total) by nebulization every 6 (six) hours as needed for wheezing or shortness of breath. 75 mL 12  . albuterol (VENTOLIN HFA) 108 (90 Base) MCG/ACT inhaler Inhale 2 puffs into the lungs every 6 (six) hours as needed for wheezing or shortness of breath. 1 Inhaler 2  . aspirin 81 MG EC tablet Take 81 mg by mouth daily.      [name] atenolol (TENORMIN) 50 MG tablet Take 1 tablet (50 mg total) by mouth 2 (two) times daily. 60 tablet 4  . budesonide-formoterol (SYMBICORT) 160-4.5 MCG/ACT inhaler Inhale 2 puffs into the lungs 2  (two) times daily as needed. 1 Inhaler 3  . calcium acetate (PHOSLO) 667 MG capsule Take 667-1,334 mg by mouth See admin instructions. Take 2 capsules with meals 3 times daily and 1 capsule with snacks    . Insulin Glargine (LANTUS SOLOSTAR) 100 UNIT/ML Solostar Pen INJECT 15 TO 25 UNITS INTO THE SKIN DAILY AT 10 PM. 15 mL 4  . NIFEdipine (ADALAT CC) 90 MG 24 hr tablet Take 90 mg by mouth daily.     . pravastatin (PRAVACHOL) 20 MG tablet Take 1 tablet by mouth once daily 90 tablet 0  . torsemide (DEMADEX) 20 MG tablet 20 mg. Take two tablets by mouth once daily    . hydrALAZINE (APRESOLINE) 50 MG tablet Take 1 tablet (50 mg total) by  mouth 2 (two) times daily. 60 tablet 3  . nitroGLYCERIN (NITROSTAT) 0.4 MG SL tablet Place 1 tablet (0.4 mg total) under the tongue every 5 (five) minutes as needed for chest pain. 25 tablet 11   No current facility-administered medications for this visit.    Allergies:   Azithromycin, Ace inhibitors, Clopidogrel bisulfate, Codeine, Lisinopril, and Penicillins   ROS:  Please see the history of present illness.   Otherwise, review of systems are positive for none.   All other systems are reviewed and negative.    PHYSICAL EXAM: VS:  BP 120/67   Pulse 69   Ht 5\' 3"  (1.6 m)   Wt 174 lb (78.9 kg)   BMI 30.82 kg/m  , BMI Body mass index is 30.82 kg/m. GENERAL:  Well appearing NECK:  No jugular venous distention, waveform within normal limits, carotid upstroke brisk and symmetric, no bruits, no thyromegaly LUNGS:  Clear to auscultation bilaterally CHEST:  Unremarkable HEART:  PMI not displaced or sustained,S1 and S2 within normal limits, no S3, no S4, no clicks, no rubs, pansystolic murmur in the right upper sternal border consistent with her dialysis fistulaABD:  Flat, positive bowel sounds normal in frequency in pitch, no bruits, no rebound, no guarding, no midline pulsatile mass, no hepatomegaly, no splenomegaly EXT:  2 plus pulses upper and decreased dorsalis  pedis and posterior tibialis bilaterally, no edema, no cyanosis no clubbing, right arm dialysis graft with thrill and bruit  EKG:  EKG is ordered today. The ekg ordered today demonstrates sinus rhythm, rate 69, axis within normal limits, intervals within normal limits, premature atrial contractions, QT prolonged.   Recent Labs: 10/15/2019: ALT 17; BUN 24; Creatinine, Ser 7.17; Hemoglobin 9.4; Platelets 194; Potassium 3.2; Sodium 138    Lipid Panel    Component Value Date/Time   CHOL 121 10/15/2019 1551   CHOL 241 (H) 03/25/2013 1026   TRIG 164 (H) 10/15/2019 1551   TRIG 147 10/09/2013 1527   TRIG 209 (H) 03/25/2013 1026   HDL 24 (L) 10/15/2019 1551   HDL 65 10/09/2013 1527   HDL 51 03/25/2013 1026   CHOLHDL 5.0 (H) 10/15/2019 1551   CHOLHDL 3.0 06/17/2008 0625   VLDL 19 06/17/2008 0625   LDLCALC 69 10/15/2019 1551   LDLCALC 125 (H) 10/09/2013 1527   LDLCALC 148 (H) 03/25/2013 1026      Wt Readings from Last 3 Encounters:  01/07/20 174 lb (78.9 kg)  12/31/19 173 lb (78.5 kg)  10/15/19 179 lb (81.2 kg)      Other studies Reviewed: Additional studies/ records that were reviewed today include: Labs. Review of the above records demonstrates:  Please see elsewhere in the note.     ASSESSMENT AND PLAN:  CAD -  She had a negative submaximal stress test in Feb of 2017.   No further testing is suggested.  She should continue aggressive risk reduction.  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  However, it somewhat difficult to assess with her ectopy and I do not think it is as long as is recorded at 520.  However, it has been prolonged before.  She has had no symptoms.  We talked about it and she knows to avoid QT prolonging drugs.  B  HYPERTENSION, BENIGN -  The blood pressure is  at target.  No change in therapy.   DYSLIPIDEMIA -   LDL was 69.  She will continue the meds as listed.   CHRONIC DIASTOLIC - Volume is managed for  dialysis.  She still makes some urine and  takes torsemide.  No change in therapy.  BRUIT -  She had mild plaque in 2017.   No further imaging.  Of note she does have reduced ABIs and is followed by [facility].  DM - A1C was 6.3 most recently.  This is the best she has had.  No change in therapy.   COVID EDUCATION - She has her name is a may be on the dialysis list and I think I talked her into a yes.   Current medicines are reviewed at length with the patient today.  The patient does not have concerns regarding medicines.  The following changes have been made:  no change  Labs/ tests ordered today include: None  Orders Placed This Encounter  Procedures  . EKG 12-Lead     Disposition:   FU with me in one year.     Signed, Minus Breeding, MD  01/07/2020 12:12 PM    [place]

## 2020-01-06 DIAGNOSIS — D631 Anemia in chronic kidney disease: Secondary | ICD-10-CM | POA: Diagnosis not present

## 2020-01-06 DIAGNOSIS — Z992 Dependence on renal dialysis: Secondary | ICD-10-CM | POA: Diagnosis not present

## 2020-01-06 DIAGNOSIS — D509 Iron deficiency anemia, unspecified: Secondary | ICD-10-CM | POA: Diagnosis not present

## 2020-01-06 DIAGNOSIS — N186 End stage renal disease: Secondary | ICD-10-CM | POA: Diagnosis not present

## 2020-01-06 DIAGNOSIS — N2581 Secondary hyperparathyroidism of renal origin: Secondary | ICD-10-CM | POA: Diagnosis not present

## 2020-01-07 ENCOUNTER — Ambulatory Visit (INDEPENDENT_AMBULATORY_CARE_PROVIDER_SITE_OTHER): Payer: Medicare Other | Admitting: Cardiology

## 2020-01-07 ENCOUNTER — Encounter: Payer: Self-pay | Admitting: Cardiology

## 2020-01-07 ENCOUNTER — Other Ambulatory Visit: Payer: Self-pay

## 2020-01-07 VITALS — BP 120/67 | HR 69 | Ht 63.0 in | Wt 174.0 lb

## 2020-01-07 DIAGNOSIS — Z7189 Other specified counseling: Secondary | ICD-10-CM

## 2020-01-07 DIAGNOSIS — R0989 Other specified symptoms and signs involving the circulatory and respiratory systems: Secondary | ICD-10-CM

## 2020-01-07 DIAGNOSIS — I5032 Chronic diastolic (congestive) heart failure: Secondary | ICD-10-CM | POA: Diagnosis not present

## 2020-01-07 DIAGNOSIS — E785 Hyperlipidemia, unspecified: Secondary | ICD-10-CM | POA: Diagnosis not present

## 2020-01-07 DIAGNOSIS — I10 Essential (primary) hypertension: Secondary | ICD-10-CM | POA: Diagnosis not present

## 2020-01-07 DIAGNOSIS — I2510 Atherosclerotic heart disease of native coronary artery without angina pectoris: Secondary | ICD-10-CM | POA: Diagnosis not present

## 2020-01-07 DIAGNOSIS — E118 Type 2 diabetes mellitus with unspecified complications: Secondary | ICD-10-CM

## 2020-01-07 DIAGNOSIS — Z794 Long term (current) use of insulin: Secondary | ICD-10-CM

## 2020-01-07 MED ORDER — NITROGLYCERIN 0.4 MG SL SUBL: 0.4000 mg | SUBLINGUAL_TABLET | SUBLINGUAL | 11 refills | Status: DC | PRN

## 2020-01-07 NOTE — Patient Instructions (Addendum)
Medication Instructions:  The current medical regimen is effective;  continue present plan and medications.  *If you need a refill on your cardiac medications before your next appointment, please call your pharmacy*  Follow-Up: At [facility], you and your health needs are our priority.  As part of our continuing mission to provide you with exceptional heart care, we have created designated Provider Care Teams.  These Care Teams include your primary Cardiologist (physician) and Advanced Practice Providers (APPs -  Physician Assistants and Nurse Practitioners) who all work together to provide you with the care you need, when you need it.  Your next appointment:   12 month(s)  The format for your next appointment:   In Person  Provider:   Minus Breeding, MD  Thank you for choosing [facility]!!    Prolonged Q-T

## 2020-01-15 ENCOUNTER — Telehealth: Payer: Self-pay | Admitting: Family Medicine

## 2020-01-15 NOTE — Chronic Care Management (AMB) (Signed)
  Chronic Care Management   Outreach Note  01/15/2020 Name: [name] MRN: [id number] DOB: 08-07-1951  [name] is a 69 y.o. year old female who is a primary care patient of Rakes, Connye Burkitt, FNP. I reached out to [name] by phone today in response to a referral sent by Ms. Cooper Render Pizzo's health plan.     An unsuccessful telephone outreach was attempted today. The patient was referred to the case management team for assistance with care management and care coordination.   Follow Up Plan: The care management team will reach out to the patient again over the next 7 days. If patient returns call to provider office, please advise to call [facility]  at [place], [address], [place], [place] [address] Direct Dial: [phone] [email or web address]: [email or web address]

## 2020-01-16 ENCOUNTER — Encounter: Payer: Self-pay | Admitting: Family Medicine

## 2020-01-19 NOTE — Chronic Care Management (AMB) (Signed)
Chronic Care Management   Note  01/19/2020 Name: [name] MRN: [id number] DOB: 05-10-1951  [name] is a 69 y.o. year old female who is a primary care patient of Rakes, Connye Burkitt, FNP. I reached out to [name] by phone today in response to a referral sent by Ms. Cooper Render Mifsud's health plan.     Ms. Krizek was given information about Chronic Care Management services today including:  1. CCM service includes personalized support from designated clinical staff supervised by her physician, including individualized plan of care and coordination with other care providers 2. 24/7 contact phone numbers for assistance for urgent and routine care needs. 3. Service will only be billed when office clinical staff spend 20 minutes or more in a month to coordinate care. 4. Only one practitioner may furnish and bill the service in a calendar month. 5. The patient may stop CCM services at any time (effective at the end of the month) by phone call to the office staff. 6. The patient will be responsible for cost sharing (co-pay) of up to 20% of the service fee (after annual deductible is met).  Patient agreed to services and verbal consent obtained.   Follow up plan: Telephone appointment with care management team member scheduled for:02/10/2020  Noreene Larsson, [place] [address], [place], [place] [address] Direct Dial: [phone] [email or web address] Website: [email or web address]

## 2020-01-20 ENCOUNTER — Encounter: Payer: Self-pay | Admitting: Family Medicine

## 2020-01-20 ENCOUNTER — Ambulatory Visit (INDEPENDENT_AMBULATORY_CARE_PROVIDER_SITE_OTHER): Payer: Medicare Other | Admitting: Family Medicine

## 2020-01-20 ENCOUNTER — Other Ambulatory Visit: Payer: Self-pay

## 2020-01-20 VITALS — BP 120/55 | HR 60 | Temp 98.9°F | Resp 20 | Ht 63.0 in | Wt 172.0 lb

## 2020-01-20 DIAGNOSIS — I10 Essential (primary) hypertension: Secondary | ICD-10-CM | POA: Diagnosis not present

## 2020-01-20 DIAGNOSIS — E1169 Type 2 diabetes mellitus with other specified complication: Secondary | ICD-10-CM | POA: Diagnosis not present

## 2020-01-20 DIAGNOSIS — E1159 Type 2 diabetes mellitus with other circulatory complications: Secondary | ICD-10-CM

## 2020-01-20 DIAGNOSIS — E1121 Type 2 diabetes mellitus with diabetic nephropathy: Secondary | ICD-10-CM | POA: Diagnosis not present

## 2020-01-20 DIAGNOSIS — E785 Hyperlipidemia, unspecified: Secondary | ICD-10-CM

## 2020-01-20 DIAGNOSIS — F411 Generalized anxiety disorder: Secondary | ICD-10-CM | POA: Diagnosis not present

## 2020-01-20 LAB — BAYER DCA HB A1C WAIVED: HB A1C (BAYER DCA - WAIVED): 7.1 % — ABNORMAL HIGH (ref <7.0)

## 2020-01-20 MED ORDER — BUSPIRONE HCL 7.5 MG PO TABS: 7.5000 mg | ORAL_TABLET | Freq: Three times a day (TID) | ORAL | 1 refills | Status: DC | PRN

## 2020-01-20 NOTE — Patient Instructions (Signed)

## 2020-01-20 NOTE — Progress Notes (Signed)
Subjective:  Patient ID: [name], female    DOB: [birth year], 69 y.o.   MRN: [id number]  Patient Care Team: Baruch Gouty, FNP as PCP - General (Family Medicine) Fran Lowes, MD (Inactive) as Consulting Physician (Nephrology) [name], MD as Referring Physician (Family Medicine) Minus Breeding, MD as Consulting Physician (Cardiology) [place], [facility], Delice Bison, RN as Registered Nurse   Chief Complaint:  Medical Management of Chronic Issues (3 mo ), Diabetes, Hyperlipidemia, and Hypertension   HPI: [name] is a 69 y.o. female presenting on 01/20/2020 for Medical Management of Chronic Issues (3 mo ), Diabetes, Hyperlipidemia, and Hypertension   1. Type II diabetes mellitus with nephropathy ([place]) Dialysis T TH S, is complaint with therapy. Does not check blood sugar at home. Denies experiencing any symptoms of highs or lows.  2. Hyperlipidemia associated with type 2 diabetes mellitus ([place]) Tolerating statin well. Eats diet high in fruits and vegetables and healthy proteins, moderate intake of fried/fatty foods. Does not exercise on a regular basis currently r/t chronic leg pain.  3. Hypertension associated with diabetes ([place]) Checks BP at home occasionally. 120/55 in office today. Denies dizziness or palpitations.    4. Nasal congestion:  Ongoing for the past few months, off and on, taking mucinex with some relief. Not taking allergy medication on a regular basis.   5. Anxiety:  Reports occasional anxiety without panic attacks. Has more episodes of anxiousness during her dialysis sessions. Not currently on medications but would like to start back on something PRN like she had previously.   Relevant past medical, surgical, family, and social history reviewed and updated as indicated.  Allergies and medications reviewed and updated. Date reviewed: Chart in Epic.   Past Medical History:  Diagnosis Date  . Acute on  chronic diastolic CHF (congestive heart failure) ([place]) 03/05/2015   Grade 2. EF 60-65%  . Anemia    hx of  . Anxiety   . Arthritis   . CAD (coronary artery disease)    s/p Stenting in 2005;  [place] 7/09: Vigorous LV function, 2-3+ MR, RI 40%, RI stent patent, proximal-mid RCA 40-50%;  [place] 7/09:  EF 55-65%, trivial MR;  [place] 11/18/12: EF 66%, normal LV wall motion, mild to moderate anterior ischemia - reviewed by [facility] and felt to rep breast atten (low risk);  E[place] 6/14: mild LVH, EF 55-65%, Gr 2 DD, PASP 44, trivial eff   . Chronic kidney disease    CREATININE IS UP--GOING TO KIDNEY MD   . Chronic neck pain   . COPD (chronic obstructive pulmonary disease) ([place])   . Degenerative joint disease    left shoulder  . Diabetic neuropathy ([place])   . Diverticulosis   . Esophageal stricture   . GERD (gastroesophageal reflux disease)   . Hyperlipidemia   . Hypertension   . IDDM (insulin dependent diabetes mellitus)   . Pericardial effusion 03/06/2015   Very small per echo ; pleural nodules seen on CT chest.  . Peripheral vascular disease ([place])   . Stroke [facility])    "mini- years ago"  . Vitamin D deficiency     Past Surgical History:  Procedure Laterality Date  . A/V FISTULAGRAM N/A 08/10/2017   Procedure: A/V Fistulagram - Right Arm;  Surgeon: Elam Dutch, MD;  Location: [place] [facility];  Service: Cardiovascular;  Laterality: N/A;  . ABDOMINAL HYSTERECTOMY    . AV FISTULA PLACEMENT Right 07/20/2015   Procedure:  RADIAL CEPHALIC ARTERIOVENOUS FISTULA CREATION RIGHT ARM;  Surgeon: Angelia Mould, MD;  Location: [place];  Service: Vascular;  Laterality: Right;  . AV FISTULA PLACEMENT Right 11/26/2015   Procedure:  Creation of RIGHT BRACHIO-CEPHALIC arteriovenous fistula;  Surgeon: Angelia Mould, MD;  Location: [place];  Service: Vascular;  Laterality: Right;  . BACK SURGERY     2007 & 2013  . BLADDER SURGERY     Tack  . CARDIAC CATHETERIZATION  2003, 2005, 2009   1 stent  .  cardiac stents    . CATARACT EXTRACTION W/PHACO Right 05/04/2014   Procedure: CATARACT EXTRACTION PHACO AND INTRAOCULAR LENS PLACEMENT (IOC);  Surgeon: Williams Che, MD;  Location: [facility];  Service: Ophthalmology;  Laterality: Right;  CDE 1.47  . CATARACT EXTRACTION W/PHACO Left 07/20/2014   Procedure: CATARACT EXTRACTION WITH PHACO AND INTRAOCULAR LENS PLACEMENT LEFT EYE CDE=1.79;  Surgeon: Williams Che, MD;  Location: [facility];  Service: Ophthalmology;  Laterality: Left;  . CERVICAL SPINE SURGERY  2012   8 screws  . CYSTOSCOPY WITH INJECTION N/A 06/03/2013   Procedure: MACROPLASTIQUE  INJECTION ;  Surgeon: Malka So, MD;  Location: [facility];  Service: Urology;  Laterality: N/A;  . FISTULOGRAM Right 10/20/2016   Procedure: FISTULOGRAM WITH POSSIBLE INTERVENTION;  Surgeon: [name], MD;  Location: [facility];  Service: Vascular;  Laterality: Right;  . INSERTION OF DIALYSIS CATHETER N/A 11/26/2015   Procedure: INSERTION OF DIALYSIS CATHETER;  Surgeon: Angelia Mould, MD;  Location: [place] [facility];  Service: Vascular;  Laterality: N/A;  . LAMINECTOMY  12/29/2011  . LUMBAR LAMINECTOMY/DECOMPRESSION MICRODISCECTOMY  12/29/2011   Procedure: LUMBAR LAMINECTOMY/DECOMPRESSION MICRODISCECTOMY;  Surgeon: Floyce Stakes, MD;  Location: [facility];  Service: Neurosurgery;  Laterality: N/A;  Lumbar Two through Lumbar Five Laminectomies/Cellsaver  . PARTIAL HYSTERECTOMY    . PERIPHERAL VASCULAR CATHETERIZATION N/A 11/22/2015   Procedure: Fistulagram;  Surgeon: Angelia Mould, MD;  Location: [facility];  Service: Cardiovascular;  Laterality: N/A;  . PERIPHERAL VASCULAR INTERVENTION  08/10/2017   Procedure: PERIPHERAL VASCULAR INTERVENTION;  Surgeon: Elam Dutch, MD;  Location: [facility];  Service: Cardiovascular;;  . SHOULDER SURGERY Right    Rotator cuff    Social History   Socioeconomic History  . Marital status: Widowed    Spouse name: Not on file  . Number of  children: 2  . Years of education: GED  . Highest education level: GED or equivalent  Occupational History  . Occupation: Disabled  Tobacco Use  . Smoking status: Former Smoker    Packs/day: 1.00    Years: 30.00    Pack years: 30.00    Quit date: 12/04/2001    Years since quitting: 18.1  . Smokeless tobacco: Never Used  Substance and Sexual Activity  . Alcohol use: No    Alcohol/week: 6.0 standard drinks    Types: 6 Cans of beer per week  . Drug use: No  . Sexual activity: Not Currently    Birth control/protection: Surgical  Other Topics Concern  . Not on file  Social History Narrative   Lives in [place] Determinants of Health   Financial Resource Strain:   . Difficulty of Paying Living Expenses: Not on file  Food Insecurity:   . Worried About Charity fundraiser in the Last Year: Not on file  . Ran Out of Food in the Last Year: Not on file  Transportation Needs:   .  Lack of Transportation (Medical): Not on file  . Lack of Transportation (Non-Medical): Not on file  Physical Activity:   . Days of Exercise per Week: Not on file  . Minutes of Exercise per Session: Not on file  Stress:   . Feeling of Stress : Not on file  Social Connections:   . Frequency of Communication with Friends and Family: Not on file  . Frequency of Social Gatherings with Friends and Family: Not on file  . Attends Religious Services: Not on file  . Active Member of Clubs or Organizations: Not on file  . Attends Archivist Meetings: Not on file  . Marital Status: Not on file  Intimate Partner Violence:   . Fear of Current or Ex-Partner: Not on file  . Emotionally Abused: Not on file  . Physically Abused: Not on file  . Sexually Abused: Not on file    Outpatient Encounter Medications as of 01/20/2020  Medication Sig  . albuterol (PROVENTIL) (2.5 MG/3ML) 0.083% nebulizer solution Take 3 mLs (2.5 mg total) by nebulization every 6 (six) hours as needed for wheezing or  shortness of breath.  [name] albuterol (VENTOLIN HFA) 108 (90 Base) MCG/ACT inhaler Inhale 2 puffs into the lungs every 6 (six) hours as needed for wheezing or shortness of breath.  [name] aspirin 81 MG EC tablet Take 81 mg by mouth daily.    [name] atenolol (TENORMIN) 50 MG tablet Take 1 tablet (50 mg total) by mouth 2 (two) times daily.  . calcium acetate (PHOSLO) 667 MG capsule Take 667-1,334 mg by mouth See admin instructions. Take 2 capsules with meals 3 times daily and 1 capsule with snacks  . hydrALAZINE (APRESOLINE) 50 MG tablet Take 1 tablet (50 mg total) by mouth 2 (two) times daily.  . Insulin Glargine (LANTUS SOLOSTAR) 100 UNIT/ML Solostar Pen INJECT 15 TO 25 UNITS INTO THE SKIN DAILY AT 10 PM.  . NIFEdipine (ADALAT CC) 90 MG 24 hr tablet Take 90 mg by mouth daily.   . pravastatin (PRAVACHOL) 20 MG tablet Take 1 tablet by mouth once daily  . torsemide (DEMADEX) 20 MG tablet 20 mg. Take two tablets by mouth once daily  . budesonide-formoterol (SYMBICORT) 160-4.5 MCG/ACT inhaler Inhale 2 puffs into the lungs 2 (two) times daily as needed. (Patient not taking: Reported on 01/20/2020)  . busPIRone (BUSPAR) 7.5 MG tablet Take 1 tablet (7.5 mg total) by mouth 3 (three) times daily as needed.  . nitroGLYCERIN (NITROSTAT) 0.4 MG SL tablet Place 1 tablet (0.4 mg total) under the tongue every 5 (five) minutes as needed for chest pain. (Patient not taking: Reported on 01/20/2020)   No facility-administered encounter medications on file as of 01/20/2020.    Allergies  Allergen Reactions  . Azithromycin     Prolonged QT  . Ace Inhibitors Cough  . Clopidogrel Bisulfate Other (See Comments)    REACTION: Sick, Headache, "Felt terrible"  . Codeine Other (See Comments)    REACTION: hallucinations  . Lisinopril Other (See Comments)    cough  . Penicillins Other (See Comments)    Has patient had a PCN reaction causing immediate rash, facial/tongue/throat swelling, SOB or lightheadedness with hypotension:  unknown Has patient had a PCN reaction causing severe rash involving mucus membranes or skin necrosis: unknown Has patient had a PCN reaction that required hospitalization unknown Has patient had a PCN reaction occurring within the last 10 years:unknown If all of the above answers are "NO", then may proceed with Cephalosporin use.  Review of Systems  Constitutional: Negative for activity change, appetite change, chills, fatigue and fever.  HENT: Negative.   Eyes: Negative.   Respiratory: Negative for cough, chest tightness and shortness of breath.   Cardiovascular: Negative for chest pain, palpitations and leg swelling.  Gastrointestinal: Negative for blood in stool, constipation, diarrhea, nausea and vomiting.  Endocrine: Negative.   Genitourinary: Negative for dysuria, frequency and urgency.  Musculoskeletal: Negative for arthralgias and myalgias.  Skin: Negative.   Allergic/Immunologic: Negative.   Neurological: Negative for dizziness and headaches.  Hematological: Negative.   Psychiatric/Behavioral: Negative for confusion, hallucinations, sleep disturbance and suicidal ideas.  All other systems reviewed and are negative.       Objective:  BP (!) 120/55   Pulse 60   Temp 98.9 F (37.2 C)   Resp 20   Ht _0  (1.6 m)   Wt 172 lb (78 kg)   SpO2 97%   BMI 30.47 kg/m    Wt Readings from Last 3 Encounters:  01/20/20 172 lb (78 kg)  01/07/20 174 lb (78.9 kg)  12/31/19 173 lb (78.5 kg)    Physical Exam Vitals and nursing note reviewed.  Constitutional:      General: She is not in acute distress.    Appearance: Normal appearance. She is well-developed and well-groomed. She is not ill-appearing, toxic-appearing or diaphoretic.  HENT:     Head: Normocephalic and atraumatic.     Jaw: There is normal jaw occlusion.     Right Ear: Hearing normal.     Left Ear: Hearing normal.     Nose: Congestion present.     Mouth/Throat:     Lips: Pink.     Mouth: Mucous  membranes are moist.     Pharynx: Oropharynx is clear. Uvula midline.  Eyes:     General: Lids are normal.     Extraocular Movements: Extraocular movements intact.     Conjunctiva/sclera: Conjunctivae normal.     Pupils: Pupils are equal, round, and reactive to light.  Neck:     Thyroid: No thyroid mass, thyromegaly or thyroid tenderness.     Vascular: No carotid bruit or JVD.     Trachea: Trachea and phonation normal.  Cardiovascular:     Rate and Rhythm: Normal rate and regular rhythm.     Chest Wall: PMI is not displaced.     Pulses: Normal pulses.     Heart sounds: Murmur present. No friction rub. No gallop.   Pulmonary:     Effort: Pulmonary effort is normal. No respiratory distress.     Breath sounds: Normal breath sounds. No wheezing.  Abdominal:     General: Bowel sounds are normal. There is no distension or abdominal bruit.     Palpations: Abdomen is soft. There is no hepatomegaly or splenomegaly.     Tenderness: There is no abdominal tenderness. There is no right CVA tenderness or left CVA tenderness.     Hernia: No hernia is present.  Musculoskeletal:        General: Normal range of motion.     Cervical back: Normal range of motion and neck supple.     Right lower leg: No edema.     Left lower leg: No edema.  Lymphadenopathy:     Cervical: No cervical adenopathy.  Skin:    General: Skin is warm and dry.     Capillary Refill: Capillary refill takes less than 2 seconds.     Coloration: Skin is not cyanotic, jaundiced or pale.  Findings: No rash.  Neurological:     General: No focal deficit present.     Mental Status: She is alert and oriented to person, place, and time.     Cranial Nerves: Cranial nerves are intact.     Sensory: Sensation is intact.     Motor: Motor function is intact.     Coordination: Coordination is intact.     Gait: Gait is intact.     Deep Tendon Reflexes: Reflexes are normal and symmetric.  Psychiatric:        Attention and Perception:  Attention and perception normal.        Mood and Affect: Mood and affect normal.        Speech: Speech normal.        Behavior: Behavior normal. Behavior is cooperative.        Thought Content: Thought content normal.        Cognition and Memory: Cognition and memory normal.        Judgment: Judgment normal.     Results for orders placed or performed in visit on 10/28/19  Novel Coronavirus, NAA ([facility])   Specimen: Nasopharyngeal(NP) swabs in vial transport medium   NASOPHARYNGE  TESTING  Result Value Ref Range   SARS-CoV-2, NAA Detected (A) Not Detected       Pertinent labs & imaging results that were available during my care of the patient were reviewed by me and considered in my medical decision making.  Assessment & Plan:  Jerah was seen today for medical management of chronic issues, diabetes, hyperlipidemia and hypertension.  Diagnoses and all orders for this visit:  Type II diabetes mellitus with nephropathy ([place]) -     CBC with Differential/Platelet A1c done at dialysis 12/29 was 6.3, will continue with current medication regimen and recheck a1c at next visit.   Hyperlipidemia associated with type 2 diabetes mellitus ([place]) -     CBC with Differential/Platelet -     Lipid panel Continue current medication regimen if labs WNL.  Hypertension associated with diabetes ([place]) -     CBC with Differential/Platelet -     CMP14+EGFR BP well controlled, continue current medication regimen.     Continue all other maintenance medications.  Follow up plan: Return in about 3 months (around 04/18/2020), or if symptoms worsen or fail to improve, for DM.  Continue healthy lifestyle choices, including diet (rich in fruits, vegetables, and lean proteins, and low in salt and simple carbohydrates) and exercise (at least 30 minutes of moderate physical activity daily).  Educational handout given for diabetes.   The above assessment and management plan was discussed with the  patient. The patient verbalized understanding of and has agreed to the management plan. Patient is aware to call the clinic if they develop any new symptoms or if symptoms persist or worsen. Patient is aware when to return to the clinic for a follow-up visit. Patient educated on when it is appropriate to go to the emergency department.   Monia Pouch, FNP-C [facility] [phone]

## 2020-01-21 LAB — CMP14+EGFR
ALT: 16 IU/L (ref 0–32)
AST: 14 IU/L (ref 0–40)
Albumin/Globulin Ratio: 2.1 (ref 1.2–2.2)
Albumin: 4.4 g/dL (ref 3.8–4.8)
Alkaline Phosphatase: 114 IU/L (ref 39–117)
BUN/Creatinine Ratio: 3 — ABNORMAL LOW (ref 12–28)
BUN: 12 mg/dL (ref 8–27)
Bilirubin Total: <0.2 mg/dL (ref 0.0–1.2)
CO2: 29 mmol/L (ref 20–29)
Calcium: 8.9 mg/dL (ref 8.7–10.3)
Chloride: 98 mmol/L (ref 96–106)
Creatinine, Ser: 4.1 mg/dL (ref 0.57–1.00)
GFR calc Af Amer: 12 mL/min/{1.73_m2} — ABNORMAL LOW (ref >59)
GFR calc non Af Amer: 11 mL/min/{1.73_m2} — ABNORMAL LOW (ref >59)
Globulin, Total: 2.1 g/dL (ref 1.5–4.5)
Glucose: 212 mg/dL — ABNORMAL HIGH (ref 65–99)
Potassium: 3.7 mmol/L (ref 3.5–5.2)
Sodium: 143 mmol/L (ref 134–144)
Total Protein: 6.5 g/dL (ref 6.0–8.5)

## 2020-01-21 LAB — CBC WITH DIFFERENTIAL/PLATELET
Basophils Absolute: 0.1 10*3/uL (ref 0.0–0.2)
Basos: 1 %
EOS (ABSOLUTE): 1.1 10*3/uL — ABNORMAL HIGH (ref 0.0–0.4)
Eos: 10 %
Hematocrit: 35.2 % (ref 34.0–46.6)
Hemoglobin: 11.8 g/dL (ref 11.1–15.9)
Immature Grans (Abs): 0 10*3/uL (ref 0.0–0.1)
Immature Granulocytes: 0 %
Lymphocytes Absolute: 2.1 10*3/uL (ref 0.7–3.1)
Lymphs: 19 %
MCH: 29.3 pg (ref 26.6–33.0)
MCHC: 33.5 g/dL (ref 31.5–35.7)
MCV: 87 fL (ref 79–97)
Monocytes Absolute: 1.1 10*3/uL — ABNORMAL HIGH (ref 0.1–0.9)
Monocytes: 10 %
Neutrophils Absolute: 6.4 10*3/uL (ref 1.4–7.0)
Neutrophils: 60 %
Platelets: 251 10*3/uL (ref 150–450)
RBC: 4.03 x10E6/uL (ref 3.77–5.28)
RDW: 13.2 % (ref 11.7–15.4)
WBC: 10.8 10*3/uL (ref 3.4–10.8)

## 2020-01-21 LAB — LIPID PANEL
Chol/HDL Ratio: 4.6 ratio — ABNORMAL HIGH (ref 0.0–4.4)
Cholesterol, Total: 173 mg/dL (ref 100–199)
HDL: 38 mg/dL — ABNORMAL LOW (ref >39)
LDL Chol Calc (NIH): 97 mg/dL (ref 0–99)
Triglycerides: 224 mg/dL — ABNORMAL HIGH (ref 0–149)
VLDL Cholesterol Cal: 38 mg/dL (ref 5–40)

## 2020-01-23 ENCOUNTER — Encounter: Payer: Self-pay | Admitting: *Deleted

## 2020-01-26 ENCOUNTER — Other Ambulatory Visit: Payer: Self-pay | Admitting: Family Medicine

## 2020-01-26 DIAGNOSIS — E1159 Type 2 diabetes mellitus with other circulatory complications: Secondary | ICD-10-CM

## 2020-01-26 DIAGNOSIS — I152 Hypertension secondary to endocrine disorders: Secondary | ICD-10-CM

## 2020-01-27 DIAGNOSIS — Z992 Dependence on renal dialysis: Secondary | ICD-10-CM | POA: Diagnosis not present

## 2020-02-01 DIAGNOSIS — Z992 Dependence on renal dialysis: Secondary | ICD-10-CM | POA: Diagnosis not present

## 2020-02-01 DIAGNOSIS — N186 End stage renal disease: Secondary | ICD-10-CM | POA: Diagnosis not present

## 2020-02-02 DIAGNOSIS — H524 Presbyopia: Secondary | ICD-10-CM | POA: Diagnosis not present

## 2020-02-02 DIAGNOSIS — H40013 Open angle with borderline findings, low risk, bilateral: Secondary | ICD-10-CM | POA: Diagnosis not present

## 2020-02-03 DIAGNOSIS — N2581 Secondary hyperparathyroidism of renal origin: Secondary | ICD-10-CM | POA: Diagnosis not present

## 2020-02-03 DIAGNOSIS — D631 Anemia in chronic kidney disease: Secondary | ICD-10-CM | POA: Diagnosis not present

## 2020-02-03 DIAGNOSIS — D509 Iron deficiency anemia, unspecified: Secondary | ICD-10-CM | POA: Diagnosis not present

## 2020-02-03 DIAGNOSIS — N186 End stage renal disease: Secondary | ICD-10-CM | POA: Diagnosis not present

## 2020-02-03 DIAGNOSIS — Z992 Dependence on renal dialysis: Secondary | ICD-10-CM | POA: Diagnosis not present

## 2020-02-09 DIAGNOSIS — Z23 Encounter for immunization: Secondary | ICD-10-CM | POA: Diagnosis not present

## 2020-02-10 ENCOUNTER — Ambulatory Visit (INDEPENDENT_AMBULATORY_CARE_PROVIDER_SITE_OTHER): Payer: Medicare Other | Admitting: *Deleted

## 2020-02-10 DIAGNOSIS — E1159 Type 2 diabetes mellitus with other circulatory complications: Secondary | ICD-10-CM | POA: Diagnosis not present

## 2020-02-10 DIAGNOSIS — E1121 Type 2 diabetes mellitus with diabetic nephropathy: Secondary | ICD-10-CM

## 2020-02-10 DIAGNOSIS — I739 Peripheral vascular disease, unspecified: Secondary | ICD-10-CM

## 2020-02-10 DIAGNOSIS — I152 Hypertension secondary to endocrine disorders: Secondary | ICD-10-CM

## 2020-02-10 DIAGNOSIS — I10 Essential (primary) hypertension: Secondary | ICD-10-CM

## 2020-02-10 NOTE — Chronic Care Management (AMB) (Signed)
Chronic Care Management   Initial Visit Note  02/10/2020 Name: [name] MRN: [id number] DOB: [birth year]  Referred by: Baruch Gouty, FNP Reason for referral : Chronic Care Management (RN Initial Visit)   [name] is a 69 y.o. year old female who is a primary care patient of Baruch Gouty, FNP. The CCM team was consulted for assistance with chronic disease management and care coordination needs related to Diabetes, hyperlipidemia, hypertension, CHF, COPD, and ESRD.  Review of patient status, including review of consultants reports, relevant laboratory and other test results, and collaboration with appropriate care team members and the patient's provider was performed as part of comprehensive patient evaluation and provision of chronic care management services.    Subjective: I spoke with [name] by telephone today. She is interested in working with the CCM team to improve self management of her chronic medical conditions. Her primary concern is needing to increase her physical activity level but she is limited in what she can do because of chronic leg pain. She is on dialysis several times a week. She does not have any problems with access to food, transportation, or medication. She moved in with her mother recently. Since they were maintaining two households, they thought it would make sense to combine them.   SDOH (Social Determinants of Health) assessments performed: Yes See Care Plan activities for detailed interventions related to SDOH)  SDOH Interventions     Most Recent Value  SDOH Interventions  SDOH Interventions for the Following Domains  Physical Activity  Physical Activity Interventions  Other (Comments) [verbal education and encouragement]       Objective:  Outpatient Encounter Medications as of 02/10/2020  Medication Sig  . albuterol (PROVENTIL) (2.5 MG/3ML) 0.083% nebulizer solution Take 3 mLs (2.5 mg total) by nebulization every 6 (six) hours as needed  for wheezing or shortness of breath.  [name] albuterol (VENTOLIN HFA) 108 (90 Base) MCG/ACT inhaler Inhale 2 puffs into the lungs every 6 (six) hours as needed for wheezing or shortness of breath.  [name] aspirin 81 MG EC tablet Take 81 mg by mouth daily.    [name] atenolol (TENORMIN) 50 MG tablet Take 1 tablet by mouth twice daily  . busPIRone (BUSPAR) 7.5 MG tablet Take 1 tablet (7.5 mg total) by mouth 3 (three) times daily as needed.  . calcium acetate (PHOSLO) 667 MG capsule Take 667-1,334 mg by mouth See admin instructions. Take 2 capsules with meals 3 times daily and 1 capsule with snacks  . Insulin Glargine (LANTUS SOLOSTAR) 100 UNIT/ML Solostar Pen INJECT 15 TO 25 UNITS INTO THE SKIN DAILY AT 10 PM.  . NIFEdipine (ADALAT CC) 90 MG 24 hr tablet Take 90 mg by mouth daily.   . nitroGLYCERIN (NITROSTAT) 0.4 MG SL tablet Place 1 tablet (0.4 mg total) under the tongue every 5 (five) minutes as needed for chest pain.  . pravastatin (PRAVACHOL) 20 MG tablet Take 1 tablet by mouth once daily  . torsemide (DEMADEX) 20 MG tablet 20 mg. Take two tablets by mouth once daily  . budesonide-formoterol (SYMBICORT) 160-4.5 MCG/ACT inhaler Inhale 2 puffs into the lungs 2 (two) times daily as needed. (Patient not taking: Reported on 01/20/2020)  . hydrALAZINE (APRESOLINE) 50 MG tablet Take 1 tablet (50 mg total) by mouth 2 (two) times daily.   No facility-administered encounter medications on file as of 02/10/2020.    Lab Results  Component Value Date   HGBA1C 7.1 (H) 01/20/2020   HGBA1C 7.0 (  H) 10/15/2019   HGBA1C 7.0 (H) 01/28/2018   Lab Results  Component Value Date   MICROALBUR 100 07/29/2014   LDLCALC 97 01/20/2020   CREATININE 4.10 ([place]) 01/20/2020   BMI Readings from Last 3 Encounters:  01/20/20 30.47 kg/m  01/07/20 30.82 kg/m  12/31/19 30.65 kg/m   BP Readings from Last 3 Encounters:  01/20/20 (!) 120/55  01/07/20 120/67  12/31/19 140/85    RN Assessment & Care Plan   . Chronic Disease Management  Needs       CARE PLAN ENTRY (see longtitudinal plan of care for additional care plan information)  Current Barriers:  . Chronic Disease Management support, education, and care coordination needs related to DM, hyperlipidemia, HTN, CHF, COPD, ESRD  Clinical Goal(s) related to DM, hyperlipidemia, HTN, CHF, COPD, ESRD:  Over the next 60 days, patient will:  . Work with the care management team to address educational, disease management, and care coordination needs  . Begin or continue self health monitoring activities as directed today Measure and record cbg (blood glucose) one times daily and Measure and record blood pressure 5 times per week . Call provider office for new or worsened signs and symptoms Blood glucose findings outside established parameters, Blood pressure findings outside established parameters, and New or worsened symptom related to chronic medical conditions. . Call care management team with questions or concerns . Verbalize basic understanding of patient centered plan of care established today  Interventions related to DM, hyperlipidemia, HTN, CHF, COPD, ESRD:  . Evaluation of current treatment plans and patient's adherence to plan as established by provider . Assessed patient understanding of disease states . Assessed patient's education and care coordination needs . Provided disease specific education to patient  . Collaborated with appropriate clinical care team members regarding patient needs  Patient Self Care Activities related to DM, hyperlipidemia, HTN, CHF, COPD, ESRD:  . Patient is unable to independently self-manage chronic health conditions  Initial goal documentation     . Increase Physical Activity       CARE PLAN ENTRY (see longtitudinal plan of care for additional care plan information)  Decreased physical activity in patient with DM, hyperlipidemia, HTN, CHF, COPD, ESRD  Current Barriers:  . Chronic leg pain  Nurse Case Manager Clinical Goal(s):   [name] Over the next 60 days, patient will work with Consulting civil engineer to address needs related to decreased physical activity level  Interventions:  . Chart reviewed, including recent office notes and lab results . Talked with patient by phone . Encouraged patient to slowly increase activity level. Walk for short distances and rest as needed.  . Discussed bilateral leg pain as a hindrance. Discussed prior testing and treatments . Encouraged patient to f/u with vascular surgeon as planned . Encouraged patient to reach out to PCP with any new or worsening symptoms . Provided with CCM contact information and encouraged to reach out as needed   Patient Self Care Activities:  . Performs ADL's independently . Performs IADL's independently  Initial goal documentation        Follow-up Plan:   The care management team will reach out to the patient again over the next 60 days.   Chong Sicilian, BSN, RN-BC Embedded Chronic Care Manager [facility] Direct Dial: [phone]

## 2020-02-10 NOTE — Patient Instructions (Signed)
Visit Information  Goals Addressed            This Visit's Progress   . Chronic Disease Management Needs       CARE PLAN ENTRY (see longtitudinal plan of care for additional care plan information)  Current Barriers:  . Chronic Disease Management support, education, and care coordination needs related to DM, hyperlipidemia, HTN, CHF, COPD, ESRD  Clinical Goal(s) related to DM, hyperlipidemia, HTN, CHF, COPD, ESRD:  Over the next 60 days, patient will:  . Work with the care management team to address educational, disease management, and care coordination needs  . Begin or continue self health monitoring activities as directed today Measure and record cbg (blood glucose) one times daily and Measure and record blood pressure 5 times per week . Call provider office for new or worsened signs and symptoms Blood glucose findings outside established parameters, Blood pressure findings outside established parameters, and New or worsened symptom related to chronic medical conditions. . Call care management team with questions or concerns . Verbalize basic understanding of patient centered plan of care established today  Interventions related to DM, hyperlipidemia, HTN, CHF, COPD, ESRD:  . Evaluation of current treatment plans and patient's adherence to plan as established by provider . Assessed patient understanding of disease states . Assessed patient's education and care coordination needs . Provided disease specific education to patient  . Collaborated with appropriate clinical care team members regarding patient needs  Patient Self Care Activities related to DM, hyperlipidemia, HTN, CHF, COPD, ESRD:  . Patient is unable to independently self-manage chronic health conditions  Initial goal documentation     . Increase Physical Activity       CARE PLAN ENTRY (see longtitudinal plan of care for additional care plan information)  Decreased physical activity in patient with DM,  hyperlipidemia, HTN, CHF, COPD, ESRD  Current Barriers:  . Chronic leg pain  Nurse Case Manager Clinical Goal(s):  [name] Over the next 60 days, patient will work with Consulting civil engineer to address needs related to decreased physical activity level  Interventions:  . Chart reviewed, including recent office notes and lab results . Talked with patient by phone . Encouraged patient to slowly increase activity level. Walk for short distances and rest as needed.  . Discussed bilateral leg pain as a hindrance. Discussed prior testing and treatments . Encouraged patient to f/u with vascular surgeon as planned . Encouraged patient to reach out to PCP with any new or worsening symptoms . Provided with CCM contact information and encouraged to reach out as needed   Patient Self Care Activities:  . Performs ADL's independently . Performs IADL's independently  Initial goal documentation        [name] was given information about Chronic Care Management services today including:  1. CCM service includes personalized support from designated clinical staff supervised by her physician, including individualized plan of care and coordination with other care providers 2. 24/7 contact phone numbers for assistance for urgent and routine care needs. 3. Service will only be billed when office clinical staff spend 20 minutes or more in a month to coordinate care. 4. Only one practitioner may furnish and bill the service in a calendar month. 5. The patient may stop CCM services at any time (effective at the end of the month) by phone call to the office staff. 6. The patient will be responsible for cost sharing (co-pay) of up to 20% of the service fee (after annual deductible is met).  Patient agreed to services and verbal consent obtained.   The patient verbalized understanding of instructions provided today and declined a print copy of patient instruction materials.   The care management team will reach  out to the patient again over the next 60 days.   [name], BSN, RN-BC Embedded Chronic Care Manager [facility] Direct Dial: [phone]

## 2020-02-19 ENCOUNTER — Telehealth: Payer: Self-pay

## 2020-02-19 NOTE — Telephone Encounter (Signed)
rec'vd referral from Dr. Holley Raring @ [facility] d/t prolonged bleeding post tx, to SP to schedule

## 2020-02-24 ENCOUNTER — Other Ambulatory Visit: Payer: Self-pay

## 2020-02-24 DIAGNOSIS — Z794 Long term (current) use of insulin: Secondary | ICD-10-CM | POA: Diagnosis not present

## 2020-02-24 DIAGNOSIS — Z992 Dependence on renal dialysis: Secondary | ICD-10-CM | POA: Diagnosis not present

## 2020-02-24 DIAGNOSIS — E119 Type 2 diabetes mellitus without complications: Secondary | ICD-10-CM | POA: Diagnosis not present

## 2020-03-01 ENCOUNTER — Other Ambulatory Visit (HOSPITAL_COMMUNITY)
Admission: RE | Admit: 2020-03-01 | Discharge: 2020-03-01 | Disposition: A | Discharge status: Home / Self Care | Payer: Medicare Other | Source: Ambulatory Visit | Attending: Vascular Surgery | Admitting: Vascular Surgery

## 2020-03-01 DIAGNOSIS — Z01812 Encounter for preprocedural laboratory examination: Secondary | ICD-10-CM | POA: Insufficient documentation

## 2020-03-01 DIAGNOSIS — Z20822 Contact with and (suspected) exposure to covid-19: Secondary | ICD-10-CM | POA: Diagnosis not present

## 2020-03-01 LAB — SARS CORONAVIRUS 2 (TAT 6-24 HRS): SARS Coronavirus 2: NEGATIVE

## 2020-03-03 ENCOUNTER — Encounter (HOSPITAL_COMMUNITY)
Admission: RE | Disposition: A | Discharge status: Home / Self Care | Payer: Self-pay | Source: Home / Self Care | Attending: Vascular Surgery

## 2020-03-03 ENCOUNTER — Other Ambulatory Visit: Payer: Self-pay

## 2020-03-03 ENCOUNTER — Ambulatory Visit (HOSPITAL_COMMUNITY)
Admission: RE | Admit: 2020-03-03 | Discharge: 2020-03-03 | Disposition: A | Discharge status: Home / Self Care | Payer: Medicare Other | Attending: Vascular Surgery | Admitting: Vascular Surgery

## 2020-03-03 DIAGNOSIS — Z7982 Long term (current) use of aspirin: Secondary | ICD-10-CM | POA: Diagnosis not present

## 2020-03-03 DIAGNOSIS — E1122 Type 2 diabetes mellitus with diabetic chronic kidney disease: Secondary | ICD-10-CM | POA: Insufficient documentation

## 2020-03-03 DIAGNOSIS — E785 Hyperlipidemia, unspecified: Secondary | ICD-10-CM | POA: Insufficient documentation

## 2020-03-03 DIAGNOSIS — Z880 Allergy status to penicillin: Secondary | ICD-10-CM | POA: Diagnosis not present

## 2020-03-03 DIAGNOSIS — Z8673 Personal history of transient ischemic attack (TIA), and cerebral infarction without residual deficits: Secondary | ICD-10-CM | POA: Diagnosis not present

## 2020-03-03 DIAGNOSIS — Z79899 Other long term (current) drug therapy: Secondary | ICD-10-CM | POA: Diagnosis not present

## 2020-03-03 DIAGNOSIS — M1990 Unspecified osteoarthritis, unspecified site: Secondary | ICD-10-CM | POA: Insufficient documentation

## 2020-03-03 DIAGNOSIS — K219 Gastro-esophageal reflux disease without esophagitis: Secondary | ICD-10-CM | POA: Diagnosis not present

## 2020-03-03 DIAGNOSIS — Z992 Dependence on renal dialysis: Secondary | ICD-10-CM | POA: Diagnosis not present

## 2020-03-03 DIAGNOSIS — Y832 Surgical operation with anastomosis, bypass or graft as the cause of abnormal reaction of the patient, or of later complication, without mention of misadventure at the time of the procedure: Secondary | ICD-10-CM | POA: Insufficient documentation

## 2020-03-03 DIAGNOSIS — Z95828 Presence of other vascular implants and grafts: Secondary | ICD-10-CM | POA: Insufficient documentation

## 2020-03-03 DIAGNOSIS — T82858A Stenosis of vascular prosthetic devices, implants and grafts, initial encounter: Secondary | ICD-10-CM | POA: Insufficient documentation

## 2020-03-03 DIAGNOSIS — Z955 Presence of coronary angioplasty implant and graft: Secondary | ICD-10-CM | POA: Diagnosis not present

## 2020-03-03 DIAGNOSIS — Z885 Allergy status to narcotic agent status: Secondary | ICD-10-CM | POA: Diagnosis not present

## 2020-03-03 DIAGNOSIS — I132 Hypertensive heart and chronic kidney disease with heart failure and with stage 5 chronic kidney disease, or end stage renal disease: Secondary | ICD-10-CM | POA: Insufficient documentation

## 2020-03-03 DIAGNOSIS — Z87891 Personal history of nicotine dependence: Secondary | ICD-10-CM | POA: Insufficient documentation

## 2020-03-03 DIAGNOSIS — E1151 Type 2 diabetes mellitus with diabetic peripheral angiopathy without gangrene: Secondary | ICD-10-CM | POA: Insufficient documentation

## 2020-03-03 DIAGNOSIS — N186 End stage renal disease: Secondary | ICD-10-CM | POA: Diagnosis not present

## 2020-03-03 DIAGNOSIS — E1140 Type 2 diabetes mellitus with diabetic neuropathy, unspecified: Secondary | ICD-10-CM | POA: Diagnosis not present

## 2020-03-03 DIAGNOSIS — I5032 Chronic diastolic (congestive) heart failure: Secondary | ICD-10-CM | POA: Diagnosis not present

## 2020-03-03 DIAGNOSIS — I2510 Atherosclerotic heart disease of native coronary artery without angina pectoris: Secondary | ICD-10-CM | POA: Insufficient documentation

## 2020-03-03 DIAGNOSIS — Z888 Allergy status to other drugs, medicaments and biological substances status: Secondary | ICD-10-CM | POA: Diagnosis not present

## 2020-03-03 DIAGNOSIS — Z8249 Family history of ischemic heart disease and other diseases of the circulatory system: Secondary | ICD-10-CM | POA: Insufficient documentation

## 2020-03-03 DIAGNOSIS — Z7951 Long term (current) use of inhaled steroids: Secondary | ICD-10-CM | POA: Diagnosis not present

## 2020-03-03 DIAGNOSIS — Z881 Allergy status to other antibiotic agents status: Secondary | ICD-10-CM | POA: Insufficient documentation

## 2020-03-03 DIAGNOSIS — Z794 Long term (current) use of insulin: Secondary | ICD-10-CM | POA: Insufficient documentation

## 2020-03-03 DIAGNOSIS — J449 Chronic obstructive pulmonary disease, unspecified: Secondary | ICD-10-CM | POA: Insufficient documentation

## 2020-03-03 HISTORY — PX: PERIPHERAL VASCULAR BALLOON ANGIOPLASTY: CATH118281

## 2020-03-03 HISTORY — PX: A/V FISTULAGRAM: CATH118298

## 2020-03-03 LAB — POCT I-STAT, CHEM 8
BUN: 38 mg/dL — ABNORMAL HIGH (ref 8–23)
Calcium, Ion: 1.1 mmol/L — ABNORMAL LOW (ref 1.15–1.40)
Chloride: 97 mmol/L — ABNORMAL LOW (ref 98–111)
Creatinine, Ser: 8.3 mg/dL — ABNORMAL HIGH (ref 0.44–1.00)
Glucose, Bld: 135 mg/dL — ABNORMAL HIGH (ref 70–99)
HCT: 34 % — ABNORMAL LOW (ref 36.0–46.0)
Hemoglobin: 11.6 g/dL — ABNORMAL LOW (ref 12.0–15.0)
Potassium: 3.8 mmol/L (ref 3.5–5.1)
Sodium: 136 mmol/L (ref 135–145)
TCO2: 32 mmol/L (ref 22–32)

## 2020-03-03 SURGERY — A/V FISTULAGRAM
Anesthesia: LOCAL

## 2020-03-03 MED ORDER — HEPARIN (PORCINE) IN NACL 1000-0.9 UT/500ML-% IV SOLN
INTRAVENOUS | Status: AC
  Filled 2020-03-03: qty 500

## 2020-03-03 MED ORDER — IODIXANOL 320 MG/ML IV SOLN
INTRAVENOUS | Status: DC | PRN
  Administered 2020-03-03: 55 mL

## 2020-03-03 MED ORDER — HEPARIN (PORCINE) IN NACL 1000-0.9 UT/500ML-% IV SOLN
INTRAVENOUS | Status: DC | PRN
  Administered 2020-03-03: 500 mL

## 2020-03-03 MED ORDER — SODIUM CHLORIDE 0.9 % IV SOLN: 250.0000 mL | INTRAVENOUS | Status: DC | PRN

## 2020-03-03 MED ORDER — LIDOCAINE HCL (PF) 1 % IJ SOLN
INTRAMUSCULAR | Status: DC | PRN
  Administered 2020-03-03: 3 mL

## 2020-03-03 MED ORDER — SODIUM CHLORIDE 0.9% FLUSH: 3.0000 mL | INTRAVENOUS | Status: DC | PRN

## 2020-03-03 MED ORDER — HEPARIN SODIUM (PORCINE) 1000 UNIT/ML IJ SOLN
INTRAMUSCULAR | Status: AC
  Filled 2020-03-03: qty 1

## 2020-03-03 MED ORDER — SODIUM CHLORIDE 0.9% FLUSH: 3.0000 mL | Freq: Two times a day (BID) | INTRAVENOUS | Status: DC

## 2020-03-03 MED ORDER — LIDOCAINE HCL (PF) 1 % IJ SOLN
INTRAMUSCULAR | Status: AC
  Filled 2020-03-03: qty 30

## 2020-03-03 MED ORDER — HEPARIN SODIUM (PORCINE) 1000 UNIT/ML IJ SOLN
INTRAMUSCULAR | Status: DC | PRN
  Administered 2020-03-03: 3000 [IU] via INTRAVENOUS

## 2020-03-03 SURGICAL SUPPLY — 16 items
BALLN ATHLETIS 8X40X75 (BALLOONS) ×3
BALLN ATHLETIS 9X80X75 (BALLOONS) ×3
BALLOON ATHLETIS 8X40X75 (BALLOONS) IMPLANT
BALLOON ATHLETIS 9X80X75 (BALLOONS) IMPLANT
COVER DOME SNAP 22 D (MISCELLANEOUS) ×3 IMPLANT
KIT ENCORE 26 ADVANTAGE (KITS) ×1 IMPLANT
KIT ENCORE 40 (KITS) ×1 IMPLANT
KIT MICROPUNCTURE NIT STIFF (SHEATH) ×1 IMPLANT
PROTECTION STATION PRESSURIZED (MISCELLANEOUS) ×3
SHEATH PINNACLE R/O II 7F 4CM (SHEATH) ×1 IMPLANT
SHEATH PROBE COVER 6X72 (BAG) ×1 IMPLANT
STATION PROTECTION PRESSURIZED (MISCELLANEOUS) ×2 IMPLANT
STOPCOCK MORSE 400PSI 3WAY (MISCELLANEOUS) ×3 IMPLANT
TRAY PV CATH (CUSTOM PROCEDURE TRAY) ×3 IMPLANT
TUBING CIL FLEX 10 FLL-RA (TUBING) ×3 IMPLANT
WIRE BENTSON .035X145CM (WIRE) ×1 IMPLANT

## 2020-03-03 NOTE — Op Note (Signed)
    OPERATIVE NOTE   PROCEDURE: 1. right brachiocephalic arteriovenous fistula cannulation under ultrasound guidance 2. right arm fistulogram including central venogram 3. right peripheral venoplasty at cephalic arch (8 mm x 40 mm and 9 mm x 80 mm Athletis)  PRE-OPERATIVE DIAGNOSIS: Malfunctioning right arteriovenous fistula with prolonged bleeding  POST-OPERATIVE DIAGNOSIS: same as above   SURGEON: Marty Heck, MD  ANESTHESIA: local  ESTIMATED BLOOD LOSS: 5 cc  FINDING(S): 1. 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  Ultimately there was a 70% stenosis of the cephalic vein at the arch near the subclavian vein junction.  Ultimately this was crossed and angioplastied with a 8 mm x 40 mm and then 9 mm x 80 mm Athletis with no significant residual stenosis.  Good thrill in the fistula.  SPECIMEN(S):  None  CONTRAST: 55 cc  INDICATIONS: [name] is a 69 y.o. female who  presents with malfunctioning right brachiocepahlic arteriovenous fistula.  The patient is scheduled for right arm fistulogram.  The patient is aware the risks include but are not limited to: bleeding, infection, thrombosis of the cannulated access, and possible anaphylactic reaction to the contrast.  The patient is aware of the risks of the procedure and elects to proceed forward.  DESCRIPTION: After full informed written consent was obtained, the patient was brought back to the angiography suite and placed supine upon the angiography table.  The patient was connected to monitoring equipment.  The right arm was prepped and draped in the standard fashion for a right arm fistulogram.  Under ultrasound guidance, the arteriovenous fistula was evaluated, it was patent, an image was saved.  It was cannulated with a micropuncture needle under ultrasound guidance.  The microwire was advanced into the fistula and the needle was exchanged for the a microsheath,  which was lodged 2 cm into the access.  The wire was removed and the sheath was connected to the IV extension tubing.  Hand injections were completed to image the access from the antecubitum up to the level of axilla.  The central venous structures were also imaged by hand injections.  Ultimately patient had a 70% stenosis of the cephalic vein just proximal to the cephalic arch and at the arch itself.  Used a Bentson wire and crossed the lesion and then upsized to a 7 French sheath in the fistula.  Patient was given 3000 units of IV heparin.  We then initially used a 8 mm x 40 mm Athletis to nominal pressure for 2 minutes and then upsized to a 9 mm x 80 mm Athletis balloon that was overinflated in order to get the balloon to fully expand given a waste in the balloon at the arch.  Once we were done we had no residual stenosis with good emptying of the fistula in the upper arm centrally.  That point time wires and catheters were removed.  I did not would be more aggressive than a overinflated 9 mm balloon.  That point in time a 4-0 Monocryl purse-string suture was sewn around the sheath.  The sheath was removed while tying down the suture.  A sterile bandage was applied to the puncture site.  COMPLICATIONS: None  CONDITION: Stable  Marty Heck, MD [facility] Office: [phone]  03/03/2020 11:31 AM

## 2020-03-03 NOTE — H&P (Signed)
H&P    History of Present Illness: This is a 69 y.o. female with multiple medical problems including end-stage renal disease that presents for planned right arm fistulogram.  Patient states she had the fistula for about 5 years and has normally worked well.  Over the last month she had prolonged bleeding whenever the needles were removed from the cannulation site.  Fistula placed in right arm, brachiocephalic in 2993 by Dr. Scot Dock.  Multiple previous interventions for cephalic stenosis.    Past Medical History:  Diagnosis Date  . Acute on chronic diastolic CHF (congestive heart failure) ([place]) 03/05/2015   Grade 2. EF 60-65%  . Anemia    hx of  . Anxiety   . Arthritis   . CAD (coronary artery disease)    s/p Stenting in 2005;  [place] 7/09: Vigorous LV function, 2-3+ MR, RI 40%, RI stent patent, proximal-mid RCA 40-50%;  Echo 7/09:  EF 55-65%, trivial MR;  [place] 11/18/12: EF 66%, normal LV wall motion, mild to moderate anterior ischemia - reviewed by [facility] and felt to rep breast atten (low risk);  Echo 6/14: mild LVH, EF 55-65%, Gr 2 DD, PASP 44, trivial eff   . Chronic kidney disease    CREATININE IS UP--GOING TO KIDNEY MD   . Chronic neck pain   . COPD (chronic obstructive pulmonary disease) ([place])   . Degenerative joint disease    left shoulder  . Diabetic neuropathy ([place])   . Diverticulosis   . Esophageal stricture   . GERD (gastroesophageal reflux disease)   . Hyperlipidemia   . Hypertension   . IDDM (insulin dependent diabetes mellitus)   . Pericardial effusion 03/06/2015   Very small per echo ; pleural nodules seen on CT chest.  . Peripheral vascular disease ([place])   . Stroke [facility])    "mini- years ago"  . Vitamin D deficiency     Past Surgical History:  Procedure Laterality Date  . A/V FISTULAGRAM N/A 08/10/2017   Procedure: A/V Fistulagram - Right Arm;  Surgeon: Elam Dutch, MD;  Location: [place] [facility];  Service: Cardiovascular;  Laterality: N/A;  . ABDOMINAL  HYSTERECTOMY    . AV FISTULA PLACEMENT Right 07/20/2015   Procedure: RADIAL CEPHALIC ARTERIOVENOUS FISTULA CREATION RIGHT ARM;  Surgeon: Angelia Mould, MD;  Location: [place];  Service: Vascular;  Laterality: Right;  . AV FISTULA PLACEMENT Right 11/26/2015   Procedure:  Creation of RIGHT BRACHIO-CEPHALIC arteriovenous fistula;  Surgeon: Angelia Mould, MD;  Location: [place];  Service: Vascular;  Laterality: Right;  . BACK SURGERY     2007 & 2013  . BLADDER SURGERY     Tack  . CARDIAC CATHETERIZATION  2003, 2005, 2009   1 stent  . cardiac stents    . CATARACT EXTRACTION W/PHACO Right 05/04/2014   Procedure: CATARACT EXTRACTION PHACO AND INTRAOCULAR LENS PLACEMENT (IOC);  Surgeon: Williams Che, MD;  Location: [facility];  Service: Ophthalmology;  Laterality: Right;  CDE 1.47  . CATARACT EXTRACTION W/PHACO Left 07/20/2014   Procedure: CATARACT EXTRACTION WITH PHACO AND INTRAOCULAR LENS PLACEMENT LEFT EYE CDE=1.79;  Surgeon: Williams Che, MD;  Location: [facility];  Service: Ophthalmology;  Laterality: Left;  . CERVICAL SPINE SURGERY  2012   8 screws  . CYSTOSCOPY WITH INJECTION N/A 06/03/2013   Procedure: MACROPLASTIQUE  INJECTION ;  Surgeon: Malka So, MD;  Location: [facility];  Service: Urology;  Laterality: N/A;  . FISTULOGRAM Right 10/20/2016   Procedure: FISTULOGRAM WITH  POSSIBLE INTERVENTION;  Surgeon: [name], MD;  Location: [facility];  Service: Vascular;  Laterality: Right;  . INSERTION OF DIALYSIS CATHETER N/A 11/26/2015   Procedure: INSERTION OF DIALYSIS CATHETER;  Surgeon: Angelia Mould, MD;  Location: [place];  Service: Vascular;  Laterality: N/A;  . LAMINECTOMY  12/29/2011  . LUMBAR LAMINECTOMY/DECOMPRESSION MICRODISCECTOMY  12/29/2011   Procedure: LUMBAR LAMINECTOMY/DECOMPRESSION MICRODISCECTOMY;  Surgeon: Floyce Stakes, MD;  Location: [place] [facility];  Service: Neurosurgery;  Laterality: N/A;  Lumbar Two through Lumbar Five Laminectomies/Cellsaver  . PARTIAL  HYSTERECTOMY    . PERIPHERAL VASCULAR CATHETERIZATION N/A 11/22/2015   Procedure: Fistulagram;  Surgeon: Angelia Mould, MD;  Location: [place] [facility];  Service: Cardiovascular;  Laterality: N/A;  . PERIPHERAL VASCULAR INTERVENTION  08/10/2017   Procedure: PERIPHERAL VASCULAR INTERVENTION;  Surgeon: Elam Dutch, MD;  Location: [place] [facility];  Service: Cardiovascular;;  . SHOULDER SURGERY Right    Rotator cuff    Allergies  Allergen Reactions  . Azithromycin     Prolonged QT  . Ace Inhibitors Cough  . Clopidogrel Bisulfate Other (See Comments)    Sick, Headache, "Felt terrible"  . Codeine Other (See Comments)    hallucinations  . Lisinopril Cough  . Penicillins Other (See Comments)    Did it involve swelling of the face/tongue/throat, SOB, or low BP? Unknown Did it involve sudden or severe rash/hives, skin peeling, or any reaction on the inside of your mouth or nose? Unknown Did you need to seek medical attention at a hospital or doctor's office? Unknown When did it last happen?childhood allergy If all above answers are "NO", may proceed with cephalosporin use.      Prior to Admission medications   Medication Sig Start Date End Date Taking? Authorizing Provider  acetaminophen (TYLENOL) 500 MG tablet Take 1,000 mg by mouth every 6 (six) hours as needed for moderate pain or headache.   Yes [provider]  albuterol (PROVENTIL) (2.5 MG/3ML) 0.083% nebulizer solution Take 3 mLs (2.5 mg total) by nebulization every 6 (six) hours as needed for wheezing or shortness of breath. 04/21/19  Yes Rakes, Connye Burkitt, FNP  albuterol (VENTOLIN HFA) 108 (90 Base) MCG/ACT inhaler Inhale 2 puffs into the lungs every 6 (six) hours as needed for wheezing or shortness of breath. 04/21/19  Yes Rakes, Connye Burkitt, FNP  aspirin 81 MG EC tablet Take 81 mg by mouth daily.     Yes [provider]  atenolol (TENORMIN) 50 MG tablet Take 1 tablet by mouth twice daily Patient  taking differently: Take 50 mg by mouth 2 (two) times daily.  01/27/20  Yes Rakes, Connye Burkitt, FNP  B Complex-C-Folic Acid (DIALYVITE 676) 0.8 MG TABS Take 1 tablet by mouth daily.   Yes [provider]  budesonide-formoterol (SYMBICORT) 160-4.5 MCG/ACT inhaler Inhale 2 puffs into the lungs 2 (two) times daily as needed. Patient taking differently: Inhale 2 puffs into the lungs 2 (two) times daily as needed (shortness of breath).  07/21/19 02/26/20 Yes Rakes, Connye Burkitt, FNP  busPIRone (BUSPAR) 7.5 MG tablet Take 1 tablet (7.5 mg total) by mouth 3 (three) times daily as needed. Patient taking differently: Take 7.5 mg by mouth 3 (three) times daily as needed (anxiety).  01/20/20  Yes Rakes, Connye Burkitt, FNP  calcium acetate (PHOSLO) 667 MG capsule Take [phone] mg by mouth See admin instructions. Take 1334 mg with meals 3 times daily and 667 mg with snacks   Yes [provider]  cetirizine (ZYRTEC) 10 MG tablet Take 10 mg by mouth daily as needed for allergies.   Yes [provider]  guaiFENesin (MUCINEX) 600 MG 12 hr tablet Take 600 mg by mouth 2 (two) times daily as needed (congestion).   Yes [provider]  hydrALAZINE (APRESOLINE) 50 MG tablet Take 1 tablet (50 mg total) by mouth 2 (two) times daily. 07/21/19 02/26/20 Yes Rakes, Connye Burkitt, FNP  Insulin Glargine (LANTUS SOLOSTAR) 100 UNIT/ML Solostar Pen INJECT 15 TO 25 UNITS INTO THE SKIN DAILY AT 10 PM. Patient taking differently: Inject 15-25 Units into the skin at bedtime.  07/21/19  Yes Rakes, Connye Burkitt, FNP  NIFEdipine (ADALAT CC) 90 MG 24 hr tablet Take 90 mg by mouth at bedtime.  08/26/19  Yes [provider]  nitroGLYCERIN (NITROSTAT) 0.4 MG SL tablet Place 1 tablet (0.4 mg total) under the tongue every 5 (five) minutes as needed for chest pain. 01/07/20  Yes Minus Breeding, MD  pravastatin (PRAVACHOL) 20 MG tablet Take 1 tablet by mouth once daily Patient taking differently: Take 20 mg by mouth at bedtime.  12/29/19   Yes Rakes, Connye Burkitt, FNP  torsemide (DEMADEX) 20 MG tablet Take 40 mg by mouth daily.    Yes [provider]    Social History   Socioeconomic History  . Marital status: Widowed    Spouse name: Not on file  . Number of children: 2  . Years of education: GED  . Highest education level: GED or equivalent  Occupational History  . Occupation: Disabled  Tobacco Use  . Smoking status: Former Smoker    Packs/day: 1.00    Years: 30.00    Pack years: 30.00    Quit date: 12/04/2001    Years since quitting: 18.2  . Smokeless tobacco: Never Used  Substance and Sexual Activity  . Alcohol use: No    Alcohol/week: 6.0 standard drinks    Types: 6 Cans of beer per week  . Drug use: No  . Sexual activity: Not Currently    Birth control/protection: Surgical  Other Topics Concern  . Not on file  Social History Narrative   Lives in [place]   Social Determinants of Health   Financial Resource Strain: Low Risk   . Difficulty of Paying Living Expenses: Not very hard  Food Insecurity: No Food Insecurity  . Worried About Charity fundraiser in the Last Year: Never true  . Ran Out of Food in the Last Year: Never true  Transportation Needs: No Transportation Needs  . Lack of Transportation (Medical): No  . Lack of Transportation (Non-Medical): No  Physical Activity: Inactive  . Days of Exercise per Week: 0 days  . Minutes of Exercise per Session: 0 min  Stress: No Stress Concern Present  . Feeling of Stress : Not at all  Social Connections: Slightly Isolated  . Frequency of Communication with Friends and Family: More than three times a week  . Frequency of Social Gatherings with Friends and Family: More than three times a week  . Attends Religious Services: More than 4 times per year  . Active Member of Clubs or Organizations: Yes  . Attends Archivist Meetings: More than 4 times per year  . Marital Status: Widowed  Intimate Partner Violence: Not At Risk  . Fear of  Current or Ex-Partner: No  . Emotionally Abused: No  . Physically Abused: No  . Sexually Abused: No     Family History  Problem Relation Age  of Onset  . Heart disease Other   . Hypertension Father   . Cancer Father   . Hypertension Daughter   . Pancreatitis Brother   . Healthy Daughter     ROS: [x]  Positive   [ ]  Negative   [ ]  All sytems reviewed and are negative  Cardiovascular: []  chest pain/pressure []  palpitations []  SOB lying flat []  DOE []  pain in legs while walking []  pain in legs at rest []  pain in legs at night []  non-healing ulcers []  hx of DVT []  swelling in legs  Pulmonary: []  productive cough []  asthma/wheezing []  home O2  Neurologic: []  weakness in []  arms []  legs []  numbness in []  arms []  legs []  hx of CVA []  mini stroke [] difficulty speaking or slurred speech []  temporary loss of vision in one eye []  dizziness  Hematologic: []  hx of cancer []  bleeding problems []  problems with blood clotting easily  Endocrine:   []  diabetes []  thyroid disease  GI []  vomiting blood []  blood in stool  GU: []  CKD/renal failure []  HD--[]  M/W/F or []  T/T/S []  burning with urination []  blood in urine  Psychiatric: []  anxiety []  depression  Musculoskeletal: []  arthritis []  joint pain  Integumentary: []  rashes []  ulcers  Constitutional: []  fever []  chills   Physical Examination  Vitals:   03/03/20 0856  BP: (!) 142/54  Pulse: (!) 41  Resp: 16  Temp: 97.7 F (36.5 C)  SpO2: 100%   Body mass index is 31 kg/m.  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 Speech is fluent/normal   CBC    Component Value Date/Time   WBC 10.8 01/20/2020 1537   WBC 9.2 11/13/2015 1455   RBC 4.03 01/20/2020 1537   RBC 3.84 (L) 11/13/2015 1455   HGB 11.6 (L) 03/03/2020 0912   HGB 11.8 01/20/2020 1537   HCT 34.0 (L) 03/03/2020 0912   HCT 35.2 01/20/2020 1537   PLT 251 01/20/2020 1537   MCV 87 01/20/2020 1537   MCH 29.3 01/20/2020 1537   MCH 26.6 11/13/2015 1455   MCHC 33.5 01/20/2020 1537   MCHC 32.7 11/13/2015 1455   RDW 13.2 01/20/2020 1537   LYMPHSABS 2.1 01/20/2020 1537   MONOABS 0.5 11/13/2015 1455   EOSABS 1.1 (H) 01/20/2020 1537   BASOSABS 0.1 01/20/2020 1537    BMET    Component Value Date/Time   NA 136 03/03/2020 0912   NA 143 01/20/2020 1537   K 3.8 03/03/2020 0912   CL 97 (L) 03/03/2020 0912   CO2 29 01/20/2020 1537   GLUCOSE 135 (H) 03/03/2020 0912   BUN 38 (H) 03/03/2020 0912   BUN 12 01/20/2020 1537   CREATININE 8.30 (H) 03/03/2020 0912   CREATININE 1.66 (H) 03/25/2013 1026   CALCIUM 8.9 01/20/2020 1537   GFRNONAA 11 (L) 01/20/2020 1537   GFRAA 12 (L) 01/20/2020 1537    COAGS: Lab Results  Component Value Date   INR 1.0 06/17/2008     Non-Invasive Vascular Imaging:    None   ASSESSMENT/PLAN: This is a 69 y.o. female with end-stage renal disease and prolonged bleeding from her right arm brachiocephalic  AV fistula.  Plan for right arm fistulogram today with possible intervention after risks and benefits are discussed.  Marty Heck, MD [facility] Office: [phone]

## 2020-03-03 NOTE — Discharge Instructions (Signed)

## 2020-03-04 DIAGNOSIS — N2581 Secondary hyperparathyroidism of renal origin: Secondary | ICD-10-CM | POA: Diagnosis not present

## 2020-03-04 DIAGNOSIS — Z992 Dependence on renal dialysis: Secondary | ICD-10-CM | POA: Diagnosis not present

## 2020-03-04 DIAGNOSIS — D509 Iron deficiency anemia, unspecified: Secondary | ICD-10-CM | POA: Diagnosis not present

## 2020-03-04 DIAGNOSIS — N186 End stage renal disease: Secondary | ICD-10-CM | POA: Diagnosis not present

## 2020-03-04 DIAGNOSIS — D631 Anemia in chronic kidney disease: Secondary | ICD-10-CM | POA: Diagnosis not present

## 2020-03-10 DIAGNOSIS — Z23 Encounter for immunization: Secondary | ICD-10-CM | POA: Diagnosis not present

## 2020-03-11 ENCOUNTER — Encounter: Payer: Self-pay | Admitting: *Deleted

## 2020-03-29 ENCOUNTER — Other Ambulatory Visit: Payer: Self-pay | Admitting: *Deleted

## 2020-03-29 DIAGNOSIS — E785 Hyperlipidemia, unspecified: Secondary | ICD-10-CM

## 2020-03-29 DIAGNOSIS — E1169 Type 2 diabetes mellitus with other specified complication: Secondary | ICD-10-CM

## 2020-03-29 MED ORDER — PRAVASTATIN SODIUM 20 MG PO TABS: 20.0000 mg | ORAL_TABLET | Freq: Every day | ORAL | 0 refills | Status: DC

## 2020-03-30 DIAGNOSIS — Z992 Dependence on renal dialysis: Secondary | ICD-10-CM | POA: Diagnosis not present

## 2020-04-02 DIAGNOSIS — Z992 Dependence on renal dialysis: Secondary | ICD-10-CM | POA: Diagnosis not present

## 2020-04-02 DIAGNOSIS — N186 End stage renal disease: Secondary | ICD-10-CM | POA: Diagnosis not present

## 2020-04-03 DIAGNOSIS — N186 End stage renal disease: Secondary | ICD-10-CM | POA: Diagnosis not present

## 2020-04-03 DIAGNOSIS — Z992 Dependence on renal dialysis: Secondary | ICD-10-CM | POA: Diagnosis not present

## 2020-04-03 DIAGNOSIS — N2581 Secondary hyperparathyroidism of renal origin: Secondary | ICD-10-CM | POA: Diagnosis not present

## 2020-04-03 DIAGNOSIS — D631 Anemia in chronic kidney disease: Secondary | ICD-10-CM | POA: Diagnosis not present

## 2020-04-03 DIAGNOSIS — D509 Iron deficiency anemia, unspecified: Secondary | ICD-10-CM | POA: Diagnosis not present

## 2020-04-13 ENCOUNTER — Ambulatory Visit (INDEPENDENT_AMBULATORY_CARE_PROVIDER_SITE_OTHER): Payer: Medicare Other | Admitting: *Deleted

## 2020-04-13 DIAGNOSIS — Z992 Dependence on renal dialysis: Secondary | ICD-10-CM | POA: Diagnosis not present

## 2020-04-13 DIAGNOSIS — M79605 Pain in left leg: Secondary | ICD-10-CM

## 2020-04-13 DIAGNOSIS — E1121 Type 2 diabetes mellitus with diabetic nephropathy: Secondary | ICD-10-CM | POA: Diagnosis not present

## 2020-04-13 DIAGNOSIS — M79604 Pain in right leg: Secondary | ICD-10-CM

## 2020-04-13 DIAGNOSIS — E1122 Type 2 diabetes mellitus with diabetic chronic kidney disease: Secondary | ICD-10-CM | POA: Diagnosis not present

## 2020-04-13 DIAGNOSIS — N186 End stage renal disease: Secondary | ICD-10-CM | POA: Diagnosis not present

## 2020-04-13 NOTE — Patient Instructions (Signed)
Visit Information  Goals Addressed            This Visit's Progress     Patient Stated   . "I want my legs to stop hurting" (pt-stated)       CARE PLAN ENTRY (see longitudinal plan of care for additional care plan information)  Current Barriers:  . Care Coordination needs related to bilateral leg pain in a patient with DM and ESRD (disease states)  Nurse Case Manager Clinical Goal(s):  [name] Over the next 7 days, patient will work with PCP to address needs related to bilateral leg pain . Over the next 30 days, patient will talk with RN Care Manager regarding bilateral leg pain  Interventions:  . Inter-disciplinary care team collaboration (see longitudinal plan of care) . Talked with patient by telephone . Discussed HPI o Constant bilateral leg pain that is worse after dialysis o Reports having evaluation with vascular surgeon and being told that the blockages are not severe enough to treat o Patient would like to try Neurontin to see if that will help o Discussed current management . Discussed potential causes of leg pain . Reviewed and discussed upcoming appointments: Dr Livia Snellen 04/19/20 . RN will collaborate with Dr Livia Snellen regarding patient's desire to try Neurontin for leg pain . Patient has been provided with CCM contact information and encouraged to reach out as needed . Encouraged patient to reach out to PCP with any new or worsening symptoms  Patient Self Care Activities:  . Performs ADL's independently . Performs IADL's independently  Initial goal documentation       Other   . "I'd like to be able to be more phsyically active"       CARE PLAN ENTRY (see longtitudinal plan of care for additional care plan information)  Decreased physical activity in patient with DM, hyperlipidemia, HTN, CHF, COPD, ESRD  Current Barriers:  . Chronic leg pain  Nurse Case Manager Clinical Goal(s):  [name] Over the next 60 days, patient will work with Consulting civil engineer to address needs  related to decreased physical activity level  Interventions:  . Chart reviewed, including recent office notes and lab results . Talked with patient by phone . Encouraged patient to slowly increase activity level. Walk for short distances and rest as needed.  . Discussed bilateral leg pain as a hindrance. Discussed prior testing and treatments . Encouraged patient to reach out to PCP with any new or worsening symptoms . Provided with CCM contact information and encouraged to reach out as needed  . Reviewed and discussed upcoming appointments: Dr Livia Snellen 04/19/20  Patient Self Care Activities:  . Performs ADL's independently . Performs IADL's independently  Please see past updates related to this goal by clicking on the "Past Updates" button in the selected goal         The patient verbalized understanding of instructions provided today and declined a print copy of patient instruction materials.   Follow-up Plan The care management team will reach out to the patient again over the next 30 days.   Chong Sicilian, BSN, RN-BC Embedded Chronic Care Manager [facility] [place] [facility] Direct Dial: [phone]

## 2020-04-13 NOTE — Chronic Care Management (AMB) (Signed)
Chronic Care Management   Follow Up Note   04/13/2020 Name: [name] MRN: [id number] DOB: 1951/08/06  Referred by: [name], MD Reason for referral : Chronic Care Management (RN Follow up)   [name] is a 69 y.o. year old female who is a primary care patient of [name], [name], MD. The CCM team was consulted for assistance with chronic disease management and care coordination needs.    Review of patient status, including review of consultants reports, relevant laboratory and other test results, and collaboration with appropriate care team members and the patient's provider was performed as part of comprehensive patient evaluation and provision of chronic care management services.    SDOH (Social Determinants of Health) assessments performed: No See Care Plan activities for detailed interventions related to [facility])     Outpatient Encounter Medications as of 04/13/2020  Medication Sig  . acetaminophen (TYLENOL) 500 MG tablet Take 1,000 mg by mouth every 6 (six) hours as needed for moderate pain or headache.  . albuterol (PROVENTIL) (2.5 MG/3ML) 0.083% nebulizer solution Take 3 mLs (2.5 mg total) by nebulization every 6 (six) hours as needed for wheezing or shortness of breath.  [name] albuterol (VENTOLIN HFA) 108 (90 Base) MCG/ACT inhaler Inhale 2 puffs into the lungs every 6 (six) hours as needed for wheezing or shortness of breath.  [name] aspirin 81 MG EC tablet Take 81 mg by mouth daily.    [name] atenolol (TENORMIN) 50 MG tablet Take 1 tablet by mouth twice daily (Patient taking differently: Take 50 mg by mouth 2 (two) times daily. )  . B Complex-C-Folic Acid (DIALYVITE 790) 0.8 MG TABS Take 1 tablet by mouth daily.  . budesonide-formoterol (SYMBICORT) 160-4.5 MCG/ACT inhaler Inhale 2 puffs into the lungs 2 (two) times daily as needed. (Patient taking differently: Inhale 2 puffs into the lungs 2 (two) times daily as needed (shortness of breath). )  . busPIRone (BUSPAR) 7.5 MG tablet  Take 1 tablet (7.5 mg total) by mouth 3 (three) times daily as needed. (Patient taking differently: Take 7.5 mg by mouth 3 (three) times daily as needed (anxiety). )  . calcium acetate (PHOSLO) 667 MG capsule Take 667-1,334 mg by mouth See admin instructions. Take 1334 mg with meals 3 times daily and 667 mg with snacks  . cetirizine (ZYRTEC) 10 MG tablet Take 10 mg by mouth daily as needed for allergies.  [name] guaiFENesin (MUCINEX) 600 MG 12 hr tablet Take 600 mg by mouth 2 (two) times daily as needed (congestion).  . hydrALAZINE (APRESOLINE) 50 MG tablet Take 1 tablet (50 mg total) by mouth 2 (two) times daily.  . Insulin Glargine (LANTUS SOLOSTAR) 100 UNIT/ML Solostar Pen INJECT 15 TO 25 UNITS INTO THE SKIN DAILY AT 10 PM. (Patient taking differently: Inject 15-25 Units into the skin at bedtime. )  . NIFEdipine (ADALAT CC) 90 MG 24 hr tablet Take 90 mg by mouth at bedtime.   . nitroGLYCERIN (NITROSTAT) 0.4 MG SL tablet Place 1 tablet (0.4 mg total) under the tongue every 5 (five) minutes as needed for chest pain.  . pravastatin (PRAVACHOL) 20 MG tablet Take 1 tablet (20 mg total) by mouth daily.  [name] torsemide (DEMADEX) 20 MG tablet Take 40 mg by mouth daily.    No facility-administered encounter medications on file as of 04/13/2020.     RN Care Plan   . "I want my legs to stop hurting" (pt-stated)       CARE PLAN ENTRY (see longitudinal plan of  care for additional care plan information)  Current Barriers:  . Care Coordination needs related to bilateral leg pain in a patient with DM and ESRD (disease states)  Nurse Case Manager Clinical Goal(s):  [name] Over the next 7 days, patient will work with PCP to address needs related to bilateral leg pain . Over the next 30 days, patient will talk with RN Care Manager regarding bilateral leg pain  Interventions:  . Inter-disciplinary care team collaboration (see longitudinal plan of care) . Talked with patient by telephone . Discussed HPI o Constant  bilateral leg pain that is worse after dialysis o Reports having evaluation with vascular surgeon and being told that the blockages are not severe enough to treat o Patient would like to try Neurontin to see if that will help o Discussed current management . Discussed potential causes of leg pain . Reviewed and discussed upcoming appointments: Dr Livia Snellen 04/19/20 . RN will collaborate with Dr Livia Snellen regarding patient's desire to try Neurontin for leg pain . Patient has been provided with CCM contact information and encouraged to reach out as needed . Encouraged patient to reach out to PCP with any new or worsening symptoms  Patient Self Care Activities:  . Performs ADL's independently . Performs IADL's independently  Initial goal documentation     . "I'd like to be able to be more phsyically active"       CARE PLAN ENTRY (see longtitudinal plan of care for additional care plan information)  Decreased physical activity in patient with DM, hyperlipidemia, HTN, CHF, COPD, ESRD  Current Barriers:  . Chronic leg pain  Nurse Case Manager Clinical Goal(s):  [name] Over the next 60 days, patient will work with Consulting civil engineer to address needs related to decreased physical activity level  Interventions:  . Chart reviewed, including recent office notes and lab results . Talked with patient by phone . Encouraged patient to slowly increase activity level. Walk for short distances and rest as needed.  . Discussed bilateral leg pain as a hindrance. Discussed prior testing and treatments . Encouraged patient to reach out to PCP with any new or worsening symptoms . Provided with CCM contact information and encouraged to reach out as needed  . Reviewed and discussed upcoming appointments: Dr Livia Snellen 04/19/20  Patient Self Care Activities:  . Performs ADL's independently . Performs IADL's independently  Please see past updates related to this goal by clicking on the "Past Updates" button in the  selected goal          Plan:   The care management team will reach out to the patient again over the next 30 days.    Chong Sicilian, BSN, RN-BC Embedded Chronic Care Manager [facility] [place] [facility] Direct Dial: [phone]

## 2020-04-19 ENCOUNTER — Other Ambulatory Visit: Payer: Self-pay

## 2020-04-19 ENCOUNTER — Encounter: Payer: Self-pay | Admitting: Family Medicine

## 2020-04-19 ENCOUNTER — Ambulatory Visit (INDEPENDENT_AMBULATORY_CARE_PROVIDER_SITE_OTHER): Payer: Medicare Other | Admitting: Family Medicine

## 2020-04-19 ENCOUNTER — Ambulatory Visit: Payer: Medicare Other | Admitting: Family Medicine

## 2020-04-19 VITALS — BP 133/67 | HR 78 | Temp 97.1°F | Ht 63.0 in | Wt 178.4 lb

## 2020-04-19 DIAGNOSIS — E1121 Type 2 diabetes mellitus with diabetic nephropathy: Secondary | ICD-10-CM

## 2020-04-19 DIAGNOSIS — E1159 Type 2 diabetes mellitus with other circulatory complications: Secondary | ICD-10-CM

## 2020-04-19 DIAGNOSIS — E1169 Type 2 diabetes mellitus with other specified complication: Secondary | ICD-10-CM

## 2020-04-19 DIAGNOSIS — E785 Hyperlipidemia, unspecified: Secondary | ICD-10-CM | POA: Diagnosis not present

## 2020-04-19 DIAGNOSIS — I2510 Atherosclerotic heart disease of native coronary artery without angina pectoris: Secondary | ICD-10-CM | POA: Diagnosis not present

## 2020-04-19 DIAGNOSIS — N185 Chronic kidney disease, stage 5: Secondary | ICD-10-CM

## 2020-04-19 DIAGNOSIS — I152 Hypertension secondary to endocrine disorders: Secondary | ICD-10-CM

## 2020-04-19 DIAGNOSIS — I10 Essential (primary) hypertension: Secondary | ICD-10-CM

## 2020-04-19 LAB — BAYER DCA HB A1C WAIVED: HB A1C (BAYER DCA - WAIVED): 8.1 % — ABNORMAL HIGH (ref <7.0)

## 2020-04-19 MED ORDER — GABAPENTIN 300 MG PO CAPS: ORAL_CAPSULE | ORAL | 1 refills | Status: DC

## 2020-04-19 MED ORDER — LANTUS SOLOSTAR 100 UNIT/ML ~~LOC~~ SOPN: PEN_INJECTOR | SUBCUTANEOUS | 4 refills | Status: DC

## 2020-04-19 NOTE — Progress Notes (Signed)
Subjective:  Patient ID: [name], female    DOB: [birth year]  Age: 69 y.o. MRN: [id number]  CC: Follow-up (3 month)   HPI [name] presents forFollow-up of diabetes. Patient does not check blood sugar at home.  Not following a regular diet.  She is consuming sodas and not counting carbs.  She is unable to exercise because of pain.  Her legs hurt and ache she rates them 8/10.  She requests a gabapentin prescription Patient denies symptoms such as polyuria, polydipsia, excessive hunger, nausea No significant hypoglycemic spells noted. Medications reviewed. Pt reports taking them regularly without complication/adverse reaction being reported today.   Patient has renal insufficiency end-stage and is taking hemodialysis 3 times weekly.  History [name] has a past medical history of Acute on chronic diastolic CHF (congestive heart failure) ([place]) (03/05/2015), Anemia, Anxiety, Arthritis, CAD (coronary artery disease), Chronic kidney disease, Chronic neck pain, COPD (chronic obstructive pulmonary disease) ([place]), Degenerative joint disease, Diabetic neuropathy ([place]), Diverticulosis, Esophageal stricture, GERD (gastroesophageal reflux disease), Hyperlipidemia, Hypertension, IDDM (insulin dependent diabetes mellitus), Pericardial effusion (03/06/2015), Peripheral vascular disease ([place]), Stroke ([place]), and Vitamin D deficiency.   She has a past surgical history that includes Bladder surgery; Partial hysterectomy; Shoulder surgery (Right); Cervical spine surgery (2012); Abdominal hysterectomy; Laminectomy (12/29/2011); cardiac stents; Lumbar laminectomy/decompression microdiscectomy (12/29/2011); Back surgery; Cystoscopy with injection (N/A, 06/03/2013); Cardiac catheterization (2003, 2005, 2009); Cataract extraction w/PHACO (Right, 05/04/2014); Cataract extraction w/PHACO (Left, 07/20/2014); AV fistula placement (Right, 07/20/2015); Cardiac catheterization (N/A, 11/22/2015); Insertion of dialysis catheter  (N/A, 11/26/2015); AV fistula placement (Right, 11/26/2015); Fistulogram (Right, 10/20/2016); A/V Fistulagram (N/A, 08/10/2017); PERIPHERAL VASCULAR INTERVENTION (08/10/2017); A/V Fistulagram (N/A, 03/03/2020); and PERIPHERAL VASCULAR BALLOON ANGIOPLASTY (03/03/2020).   Her family history includes Cancer in her father; Healthy in her daughter; Heart disease in an other family member; Hypertension in her daughter and father; Pancreatitis in her brother.She reports that she quit smoking about 18 years ago. She has a 30.00 pack-year smoking history. She has never used smokeless tobacco. She reports that she does not drink alcohol or use drugs.  Current Outpatient Medications on File Prior to Visit  Medication Sig Dispense Refill  . acetaminophen (TYLENOL) 500 MG tablet Take 1,000 mg by mouth every 6 (six) hours as needed for moderate pain or headache.    . albuterol (PROVENTIL) (2.5 MG/3ML) 0.083% nebulizer solution Take 3 mLs (2.5 mg total) by nebulization every 6 (six) hours as needed for wheezing or shortness of breath. 75 mL 12  . albuterol (VENTOLIN HFA) 108 (90 Base) MCG/ACT inhaler Inhale 2 puffs into the lungs every 6 (six) hours as needed for wheezing or shortness of breath. 1 Inhaler 2  . aspirin 81 MG EC tablet Take 81 mg by mouth daily.      [name] atenolol (TENORMIN) 50 MG tablet Take 1 tablet by mouth twice daily (Patient taking differently: Take 50 mg by mouth 2 (two) times daily. ) 180 tablet 1  . budesonide-formoterol (SYMBICORT) 160-4.5 MCG/ACT inhaler Inhale 2 puffs into the lungs 2 (two) times daily as needed. (Patient taking differently: Inhale 2 puffs into the lungs 2 (two) times daily as needed (shortness of breath). ) 1 Inhaler 3  . busPIRone (BUSPAR) 7.5 MG tablet Take 1 tablet (7.5 mg total) by mouth 3 (three) times daily as needed. (Patient taking differently: Take 7.5 mg by mouth 3 (three) times daily as needed (anxiety). ) 90 tablet 1  . calcium acetate (PHOSLO) 667 MG capsule Take  667-1,334 mg by mouth See  admin instructions. Take 1334 mg with meals 3 times daily and 667 mg with snacks    . cetirizine (ZYRTEC) 10 MG tablet Take 10 mg by mouth daily as needed for allergies.    [name] guaiFENesin (MUCINEX) 600 MG 12 hr tablet Take 600 mg by mouth 2 (two) times daily as needed (congestion).    . hydrALAZINE (APRESOLINE) 50 MG tablet Take 1 tablet (50 mg total) by mouth 2 (two) times daily. 60 tablet 3  . NIFEdipine (ADALAT CC) 90 MG 24 hr tablet Take 90 mg by mouth at bedtime.     . nitroGLYCERIN (NITROSTAT) 0.4 MG SL tablet Place 1 tablet (0.4 mg total) under the tongue every 5 (five) minutes as needed for chest pain. 25 tablet 11  . pravastatin (PRAVACHOL) 20 MG tablet Take 1 tablet (20 mg total) by mouth daily. 90 tablet 0  . torsemide (DEMADEX) 20 MG tablet Take 40 mg by mouth daily.     . B Complex-C-Folic Acid (DIALYVITE 545) 0.8 MG TABS Take 1 tablet by mouth daily.     No current facility-administered medications on file prior to visit.    ROS Review of Systems  Constitutional: Negative.   HENT: Negative for congestion.   Eyes: Negative for visual disturbance.  Respiratory: Negative for shortness of breath.   Cardiovascular: Negative for chest pain.  Gastrointestinal: Negative for abdominal pain, constipation, diarrhea, nausea and vomiting.  Genitourinary: Negative for difficulty urinating.  Musculoskeletal: Positive for arthralgias. Negative for myalgias.  Neurological: Negative for headaches.  Psychiatric/Behavioral: Negative for sleep disturbance.    Objective:  BP 133/67   Pulse 78   Temp (!) 97.1 F (36.2 C) (Temporal)   Ht '5\' 3"'  (1.6 m)   Wt 178 lb 6.4 oz (80.9 kg)   BMI 31.60 kg/m   BP Readings from Last 3 Encounters:  04/19/20 133/67  03/03/20 137/86  01/20/20 (!) 120/55    Wt Readings from Last 3 Encounters:  04/19/20 178 lb 6.4 oz (80.9 kg)  03/03/20 175 lb (79.4 kg)  01/20/20 172 lb (78 kg)     Physical Exam Constitutional:       General: She is not in acute distress.    Appearance: She is well-developed.  HENT:     Head: Normocephalic and atraumatic.     Right Ear: External ear normal.     Left Ear: External ear normal.     Nose: Nose normal.  Eyes:     Conjunctiva/sclera: Conjunctivae normal.     Pupils: Pupils are equal, round, and reactive to light.  Neck:     Thyroid: No thyromegaly.  Cardiovascular:     Rate and Rhythm: Normal rate and regular rhythm.     Heart sounds: Normal heart sounds. No murmur.  Pulmonary:     Effort: Pulmonary effort is normal. No respiratory distress.     Breath sounds: Normal breath sounds. No wheezing or rales.  Abdominal:     General: Bowel sounds are normal. There is no distension.     Palpations: Abdomen is soft.     Tenderness: There is no abdominal tenderness.  Musculoskeletal:        General: Tenderness (Anterior knees) present.     Cervical back: Normal range of motion and neck supple.  Lymphadenopathy:     Cervical: No cervical adenopathy.  Skin:    General: Skin is warm and dry.  Neurological:     Mental Status: She is alert and oriented to person, place, and time.  Deep Tendon Reflexes: Reflexes are normal and symmetric.  Psychiatric:        Behavior: Behavior normal.        Thought Content: Thought content normal.        Judgment: Judgment normal.       Assessment & Plan:   [name] was seen today for follow-up.  Diagnoses and all orders for this visit:  Type II diabetes mellitus with nephropathy ([place]) -     CBC with Differential/Platelet -     CMP14+EGFR -     Bayer DCA Hb A1c Waived -     insulin glargine (LANTUS SOLOSTAR) 100 UNIT/ML Solostar Pen; INJECT  25 UNITS INTO THE SKIN DAILY AT 10 PM.  Hypertension associated with diabetes ([place]) -     CBC with Differential/Platelet -     CMP14+EGFR  Hyperlipidemia associated with type 2 diabetes mellitus (HCC) -     CBC with Differential/Platelet -     CMP14+EGFR  Essential hypertension -      CBC with Differential/Platelet -     CMP14+EGFR  Chronic renal insufficiency, stage 5 (HCC)  Other orders -     gabapentin (NEURONTIN) 300 MG capsule; Take one after dialysis      I have changed [name]'s Lantus SoloStar. I am also having her start on gabapentin. Additionally, I am having her maintain her aspirin, calcium acetate, torsemide, albuterol, albuterol, budesonide-formoterol, hydrALAZINE, NIFEdipine, nitroGLYCERIN, busPIRone, atenolol, acetaminophen, guaiFENesin, Dialyvite 800, cetirizine, and pravastatin.  Meds ordered this encounter  Medications  . gabapentin (NEURONTIN) 300 MG capsule    Sig: Take one after dialysis    Dispense:  30 capsule    Refill:  1  . insulin glargine (LANTUS SOLOSTAR) 100 UNIT/ML Solostar Pen    Sig: INJECT  25 UNITS INTO THE SKIN DAILY AT 10 PM.    Dispense:  15 mL    Refill:  4     Follow-up: Return in about 1 month (around 05/20/2020).  [name], M.D.

## 2020-04-20 LAB — CBC WITH DIFFERENTIAL/PLATELET
Basophils Absolute: 0.1 10*3/uL (ref 0.0–0.2)
Basos: 1 %
EOS (ABSOLUTE): 0.9 10*3/uL — ABNORMAL HIGH (ref 0.0–0.4)
Eos: 7 %
Hematocrit: 33.6 % — ABNORMAL LOW (ref 34.0–46.6)
Hemoglobin: 10.9 g/dL — ABNORMAL LOW (ref 11.1–15.9)
Immature Grans (Abs): 0 10*3/uL (ref 0.0–0.1)
Immature Granulocytes: 0 %
Lymphocytes Absolute: 2.4 10*3/uL (ref 0.7–3.1)
Lymphs: 19 %
MCH: 28.5 pg (ref 26.6–33.0)
MCHC: 32.4 g/dL (ref 31.5–35.7)
MCV: 88 fL (ref 79–97)
Monocytes Absolute: 0.8 10*3/uL (ref 0.1–0.9)
Monocytes: 7 %
Neutrophils Absolute: 8.4 10*3/uL — ABNORMAL HIGH (ref 1.4–7.0)
Neutrophils: 66 %
Platelets: 352 10*3/uL (ref 150–450)
RBC: 3.83 x10E6/uL (ref 3.77–5.28)
RDW: 14.3 % (ref 11.7–15.4)
WBC: 12.6 10*3/uL — ABNORMAL HIGH (ref 3.4–10.8)

## 2020-04-20 LAB — CMP14+EGFR
ALT: 15 IU/L (ref 0–32)
AST: 12 IU/L (ref 0–40)
Albumin/Globulin Ratio: 1.8 (ref 1.2–2.2)
Albumin: 4.5 g/dL (ref 3.8–4.8)
Alkaline Phosphatase: 145 IU/L — ABNORMAL HIGH (ref 48–121)
BUN/Creatinine Ratio: 6 — ABNORMAL LOW (ref 12–28)
BUN: 57 mg/dL — ABNORMAL HIGH (ref 8–27)
Bilirubin Total: <0.2 mg/dL (ref 0.0–1.2)
CO2: 23 mmol/L (ref 20–29)
Calcium: 8.8 mg/dL (ref 8.7–10.3)
Chloride: 93 mmol/L — ABNORMAL LOW (ref 96–106)
Creatinine, Ser: 8.89 mg/dL (ref 0.57–1.00)
GFR calc Af Amer: 5 mL/min/{1.73_m2} — ABNORMAL LOW (ref >59)
GFR calc non Af Amer: 4 mL/min/{1.73_m2} — ABNORMAL LOW (ref >59)
Globulin, Total: 2.5 g/dL (ref 1.5–4.5)
Glucose: 265 mg/dL — ABNORMAL HIGH (ref 65–99)
Potassium: 4.8 mmol/L (ref 3.5–5.2)
Sodium: 138 mmol/L (ref 134–144)
Total Protein: 7 g/dL (ref 6.0–8.5)

## 2020-04-27 DIAGNOSIS — Z992 Dependence on renal dialysis: Secondary | ICD-10-CM | POA: Diagnosis not present

## 2020-05-03 DIAGNOSIS — Z992 Dependence on renal dialysis: Secondary | ICD-10-CM | POA: Diagnosis not present

## 2020-05-03 DIAGNOSIS — N186 End stage renal disease: Secondary | ICD-10-CM | POA: Diagnosis not present

## 2020-05-04 DIAGNOSIS — Z992 Dependence on renal dialysis: Secondary | ICD-10-CM | POA: Diagnosis not present

## 2020-05-04 DIAGNOSIS — N2581 Secondary hyperparathyroidism of renal origin: Secondary | ICD-10-CM | POA: Diagnosis not present

## 2020-05-04 DIAGNOSIS — N186 End stage renal disease: Secondary | ICD-10-CM | POA: Diagnosis not present

## 2020-05-04 DIAGNOSIS — D509 Iron deficiency anemia, unspecified: Secondary | ICD-10-CM | POA: Diagnosis not present

## 2020-05-04 DIAGNOSIS — D631 Anemia in chronic kidney disease: Secondary | ICD-10-CM | POA: Diagnosis not present

## 2020-05-06 DIAGNOSIS — N2581 Secondary hyperparathyroidism of renal origin: Secondary | ICD-10-CM | POA: Diagnosis not present

## 2020-05-06 DIAGNOSIS — N186 End stage renal disease: Secondary | ICD-10-CM | POA: Diagnosis not present

## 2020-05-06 DIAGNOSIS — Z992 Dependence on renal dialysis: Secondary | ICD-10-CM | POA: Diagnosis not present

## 2020-05-10 ENCOUNTER — Ambulatory Visit: Payer: Medicare Other | Admitting: Family Medicine

## 2020-05-12 DIAGNOSIS — N186 End stage renal disease: Secondary | ICD-10-CM | POA: Diagnosis not present

## 2020-05-12 DIAGNOSIS — E8779 Other fluid overload: Secondary | ICD-10-CM | POA: Diagnosis not present

## 2020-05-12 DIAGNOSIS — Z992 Dependence on renal dialysis: Secondary | ICD-10-CM | POA: Diagnosis not present

## 2020-05-12 DIAGNOSIS — E8770 Fluid overload, unspecified: Secondary | ICD-10-CM | POA: Diagnosis not present

## 2020-05-14 ENCOUNTER — Ambulatory Visit: Payer: Medicare Other | Admitting: *Deleted

## 2020-05-14 NOTE — Chronic Care Management (AMB) (Signed)
  Chronic Care Management   Follow-up Note  05/14/2020 Name: [name] MRN: [id number] DOB: [birth year]  Referred by: [name], MD Reason for referral : Chronic Care Management (RN follow up)   An unsuccessful telephone follow-up was attempted today. The patient was referred to the case management team for assistance with care management and care coordination.   Follow Up Plan: A HIPPA compliant phone message was left for the patient providing contact information and requesting a return call.  The care management team will reach out to the patient again over the next 30 days.   Chong Sicilian, BSN, RN-BC Embedded Chronic Care Manager [facility] [place] [facility] Direct Dial: [phone]

## 2020-05-20 ENCOUNTER — Encounter: Payer: Self-pay | Admitting: Family Medicine

## 2020-05-20 ENCOUNTER — Other Ambulatory Visit: Payer: Self-pay

## 2020-05-20 ENCOUNTER — Ambulatory Visit (INDEPENDENT_AMBULATORY_CARE_PROVIDER_SITE_OTHER): Payer: Medicare Other | Admitting: Family Medicine

## 2020-05-20 VITALS — BP 131/69 | HR 77 | Temp 97.4°F | Ht 63.0 in | Wt 179.2 lb

## 2020-05-20 DIAGNOSIS — I2510 Atherosclerotic heart disease of native coronary artery without angina pectoris: Secondary | ICD-10-CM

## 2020-05-20 DIAGNOSIS — E1142 Type 2 diabetes mellitus with diabetic polyneuropathy: Secondary | ICD-10-CM | POA: Diagnosis not present

## 2020-05-20 DIAGNOSIS — N186 End stage renal disease: Secondary | ICD-10-CM

## 2020-05-20 MED ORDER — GABAPENTIN 300 MG PO CAPS: ORAL_CAPSULE | ORAL | 2 refills | Status: DC

## 2020-05-20 NOTE — Progress Notes (Signed)
Subjective:  Patient ID: [name], female    DOB: November 01, 1951  Age: 69 y.o. MRN: [id number]  CC: Follow-up (1 month)   HPI [name] presents for significant improvement in the leg cramps and pain.  Still having some.  She is taking the gabapentin 1 tablet after each dialysis.  She is on chronic hemodialysis.  She continues to have enough cramps that she would like to have further relief.  She is also having continued pain from her neuropathy in the legs as well as some back pain.  Depression screen [facility] 2/9 05/24/2020 05/20/2020 04/19/2020  Decreased Interest 0 0 0  Down, Depressed, Hopeless 0 0 0  PHQ - 2 Score 0 0 0  Altered sleeping 3 - 3  Tired, decreased energy 2 - 1  Change in appetite 0 - 0  Feeling bad or failure about yourself  0 - 0  Trouble concentrating 0 - 0  Moving slowly or fidgety/restless 0 - 0  Suicidal thoughts 0 - 0  PHQ-9 Score 5 - 4  Some recent data might be hidden    History [name] has a past medical history of Acute on chronic diastolic CHF (congestive heart failure) ([place]) (03/05/2015), Anemia, Anxiety, Arthritis, CAD (coronary artery disease), Chronic kidney disease, Chronic neck pain, COPD (chronic obstructive pulmonary disease) (H[facility]), Degenerative joint disease, Diabetic neuropathy ([place]), Diverticulosis, Esophageal stricture, GERD (gastroesophageal reflux disease), Hyperlipidemia, Hypertension, IDDM (insulin dependent diabetes mellitus), Pericardial effusion (03/06/2015), Peripheral vascular disease ([place]), Stroke ([place]), and Vitamin D deficiency.   She has a past surgical history that includes Bladder surgery; Partial hysterectomy; Shoulder surgery (Right); Cervical spine surgery (2012); Abdominal hysterectomy; Laminectomy (12/29/2011); cardiac stents; Lumbar laminectomy/decompression microdiscectomy (12/29/2011); Back surgery; Cystoscopy with injection (N/A, 06/03/2013); Cardiac catheterization (2003, 2005, 2009); Cataract extraction w/PHACO (Right,  05/04/2014); Cataract extraction w/PHACO (Left, 07/20/2014); AV fistula placement (Right, 07/20/2015); Cardiac catheterization (N/A, 11/22/2015); Insertion of dialysis catheter (N/A, 11/26/2015); AV fistula placement (Right, 11/26/2015); Fistulogram (Right, 10/20/2016); A/V Fistulagram (N/A, 08/10/2017); PERIPHERAL VASCULAR INTERVENTION (08/10/2017); A/V Fistulagram (N/A, 03/03/2020); and PERIPHERAL VASCULAR BALLOON ANGIOPLASTY (03/03/2020).   Her family history includes Cancer in her father; Diabetes in her daughter; Heart disease in an other family member; Hypertension in her daughter, daughter, father, and mother; Pancreatitis in her brother.She reports that she quit smoking about 18 years ago. She has a 30.00 pack-year smoking history. She has never used smokeless tobacco. She reports that she does not drink alcohol and does not use drugs.    ROS Review of Systems  Constitutional: Negative.   HENT: Negative.   Respiratory: Negative for shortness of breath.   Cardiovascular: Negative for chest pain.  Gastrointestinal: Negative for abdominal pain.  Musculoskeletal: Positive for arthralgias and back pain.    Objective:  BP 131/69   Pulse 77   Temp (!) 97.4 F (36.3 C) (Temporal)   Ht 5\' 3"  (1.6 m)   Wt 179 lb 3.2 oz (81.3 kg)   BMI 31.74 kg/m   BP Readings from Last 3 Encounters:  05/20/20 131/69  04/19/20 133/67  03/03/20 137/86    Wt Readings from Last 3 Encounters:  05/20/20 179 lb 3.2 oz (81.3 kg)  04/19/20 178 lb 6.4 oz (80.9 kg)  03/03/20 175 lb (79.4 kg)     Physical Exam Constitutional:      General: She is not in acute distress.    Appearance: She is well-developed.  Cardiovascular:     Rate and Rhythm: Normal rate and regular rhythm.  Pulmonary:  Breath sounds: Normal breath sounds.  Skin:    General: Skin is warm and dry.  Neurological:     Mental Status: She is alert and oriented to person, place, and time.       Assessment & Plan:   [name] was seen  today for follow-up.  Diagnoses and all orders for this visit:  End stage renal disease ([place])  Diabetic polyneuropathy associated with type 2 diabetes mellitus ([place])  Other orders -     gabapentin (NEURONTIN) 300 MG capsule; Take two after each dialysis       I have changed [name]'s gabapentin. I am also having her maintain her aspirin, calcium acetate, torsemide, albuterol, albuterol, budesonide-formoterol, hydrALAZINE, nitroGLYCERIN, busPIRone, atenolol, acetaminophen, guaiFENesin, Dialyvite 800, cetirizine, pravastatin, Lantus SoloStar, and NIFEdipine.  Allergies as of 05/20/2020      Reactions   Azithromycin    Prolonged QT   Ace Inhibitors Cough   Clopidogrel Bisulfate Other (See Comments)   Sick, Headache, "Felt terrible"   Codeine Other (See Comments)   hallucinations   Lisinopril Cough   Penicillins Other (See Comments)   Did it involve swelling of the face/tongue/throat, SOB, or low BP? Unknown Did it involve sudden or severe rash/hives, skin peeling, or any reaction on the inside of your mouth or nose? Unknown Did you need to seek medical attention at a hospital or doctor's office? Unknown When did it last happen?childhood allergy If all above answers are "NO", may proceed with cephalosporin use.      Medication List       Accurate as of May 20, 2020 11:59 PM. If you have any questions, ask your nurse or doctor.        acetaminophen 500 MG tablet Commonly known as: TYLENOL Take 1,000 mg by mouth every 6 (six) hours as needed for moderate pain or headache.   albuterol 108 (90 Base) MCG/ACT inhaler Commonly known as: VENTOLIN HFA Inhale 2 puffs into the lungs every 6 (six) hours as needed for wheezing or shortness of breath.   albuterol (2.5 MG/3ML) 0.083% nebulizer solution Commonly known as: PROVENTIL Take 3 mLs (2.5 mg total) by nebulization every 6 (six) hours as needed for wheezing or shortness of breath.   aspirin 81 MG EC  tablet Take 81 mg by mouth daily.   atenolol 50 MG tablet Commonly known as: TENORMIN Take 1 tablet by mouth twice daily   budesonide-formoterol 160-4.5 MCG/ACT inhaler Commonly known as: SYMBICORT Inhale 2 puffs into the lungs 2 (two) times daily as needed. What changed: reasons to take this   busPIRone 7.5 MG tablet Commonly known as: BUSPAR Take 1 tablet (7.5 mg total) by mouth 3 (three) times daily as needed. What changed: reasons to take this   calcium acetate 667 MG capsule Commonly known as: PHOSLO Take 667-1,334 mg by mouth See admin instructions. Take 1334 mg with meals 3 times daily and 667 mg with snacks   cetirizine 10 MG tablet Commonly known as: ZYRTEC Take 10 mg by mouth daily as needed for allergies.   Dialyvite 800 0.8 MG Tabs Take 1 tablet by mouth daily.   gabapentin 300 MG capsule Commonly known as: NEURONTIN Take two after each dialysis What changed: additional instructions Changed by: [name], MD   guaiFENesin 600 MG 12 hr tablet Commonly known as: MUCINEX Take 600 mg by mouth 2 (two) times daily as needed (congestion).   hydrALAZINE 50 MG tablet Commonly known as: APRESOLINE Take 1 tablet (50 mg total)  by mouth 2 (two) times daily.   Lantus SoloStar 100 UNIT/ML Solostar Pen Generic drug: insulin glargine INJECT  25 UNITS INTO THE SKIN DAILY AT 10 PM.   NIFEdipine 90 MG 24 hr tablet Commonly known as: PROCARDIA XL/NIFEDICAL-XL   nitroGLYCERIN 0.4 MG SL tablet Commonly known as: NITROSTAT Place 1 tablet (0.4 mg total) under the tongue every 5 (five) minutes as needed for chest pain.   pravastatin 20 MG tablet Commonly known as: PRAVACHOL Take 1 tablet (20 mg total) by mouth daily.   torsemide 20 MG tablet Commonly known as: DEMADEX Take 40 mg by mouth daily.        Follow-up: No follow-ups on file.  [name], M.D.

## 2020-05-24 ENCOUNTER — Ambulatory Visit (INDEPENDENT_AMBULATORY_CARE_PROVIDER_SITE_OTHER): Payer: Medicare Other | Admitting: *Deleted

## 2020-05-24 ENCOUNTER — Encounter: Payer: Self-pay | Admitting: Family Medicine

## 2020-05-24 DIAGNOSIS — Z0000 Encounter for general adult medical examination without abnormal findings: Secondary | ICD-10-CM

## 2020-05-24 NOTE — Progress Notes (Signed)
MEDICARE ANNUAL WELLNESS VISIT  05/24/2020  Telephone Visit Disclaimer This Medicare AWV was conducted by telephone due to national recommendations for restrictions regarding the COVID-19 Pandemic (e.g. social distancing).  I verified, using two identifiers, that I am speaking with [name] or their authorized healthcare agent. I discussed the limitations, risks, security, and privacy concerns of performing an evaluation and management service by telephone and the potential availability of an in-person appointment in the future. The patient expressed understanding and agreed to proceed.   Subjective:  [name] is a 69 y.o. female patient of [name], Tamara Gash, MD who had a Medicare Annual Wellness Visit today via telephone. [name] is Disabled and lives alone. She has 2 adult children. She reports that she is socially active and does interact with friends/family regularly. She is not physically active and enjoys playing on her phone and computer. Patient is currently receiving dialysis three times a week.   Patient Care Team: [name], MD as PCP - General (Family Medicine) Fran Lowes, MD (Inactive) as Consulting Physician (Nephrology) [name], MD as Referring Physician (Family Medicine) Minus Breeding, MD as Consulting Physician (Cardiology) [place], [facility] [place], Delice Bison, [place] as Registered Nurse  Advanced Directives 05/24/2020 03/03/2020 06/17/2018 08/10/2017 10/19/2016 10/16/2016 10/06/2016  Does Patient Have a Medical Advance Directive? No No No No No No No  Would patient like information on creating a medical advance directive? No - Patient declined No - Patient declined No - Patient declined Yes (MAU/Ambulatory/Procedural Areas - Information given) No - patient declined information No - patient declined information No - patient declined information  Pre-existing out of facility DNR order (yellow form or pink MOST form) - - - - - - -     Hospital Utilization Over the Past 12 Months: # of hospitalizations or ER visits: 0 # of surgeries:1   Review of Systems    Patient reports that her overall health is unchanged compared to last year.  History obtained from chart review and the patient  Patient Reported Readings (BP, Pulse, CBG, Weight, etc) none  Pain Assessment Pain : No/denies pain     Current Medications & Allergies (verified) Allergies as of 05/24/2020      Reactions   Azithromycin    Prolonged QT   Ace Inhibitors Cough   Clopidogrel Bisulfate Other (See Comments)   Sick, Headache, "Felt terrible"   Codeine Other (See Comments)   hallucinations   Lisinopril Cough   Penicillins Other (See Comments)   Did it involve swelling of the face/tongue/throat, SOB, or low BP? Unknown Did it involve sudden or severe rash/hives, skin peeling, or any reaction on the inside of your mouth or nose? Unknown Did you need to seek medical attention at a hospital or doctor's office? Unknown When did it last happen?childhood allergy If all above answers are "NO", may proceed with cephalosporin use.      Medication List       Accurate as of May 24, 2020 10:44 AM. If you have any questions, ask your nurse or doctor.        acetaminophen 500 MG tablet Commonly known as: TYLENOL Take 1,000 mg by mouth every 6 (six) hours as needed for moderate pain or headache.   albuterol 108 (90 Base) MCG/ACT inhaler Commonly known as: VENTOLIN HFA Inhale 2 puffs into the lungs every 6 (six) hours as needed for wheezing or shortness of breath.   albuterol (2.5 MG/3ML) 0.083% nebulizer solution Commonly  known as: PROVENTIL Take 3 mLs (2.5 mg total) by nebulization every 6 (six) hours as needed for wheezing or shortness of breath.   aspirin 81 MG EC tablet Take 81 mg by mouth daily.   atenolol 50 MG tablet Commonly known as: TENORMIN Take 1 tablet by mouth twice daily   budesonide-formoterol 160-4.5 MCG/ACT  inhaler Commonly known as: SYMBICORT Inhale 2 puffs into the lungs 2 (two) times daily as needed. What changed: reasons to take this   busPIRone 7.5 MG tablet Commonly known as: BUSPAR Take 1 tablet (7.5 mg total) by mouth 3 (three) times daily as needed. What changed: reasons to take this   calcium acetate 667 MG capsule Commonly known as: PHOSLO Take 667-1,334 mg by mouth See admin instructions. Take 1334 mg with meals 3 times daily and 667 mg with snacks   cetirizine 10 MG tablet Commonly known as: ZYRTEC Take 10 mg by mouth daily as needed for allergies.   Dialyvite 800 0.8 MG Tabs Take 1 tablet by mouth daily.   gabapentin 300 MG capsule Commonly known as: NEURONTIN Take two after each dialysis   guaiFENesin 600 MG 12 hr tablet Commonly known as: MUCINEX Take 600 mg by mouth 2 (two) times daily as needed (congestion).   hydrALAZINE 50 MG tablet Commonly known as: APRESOLINE Take 1 tablet (50 mg total) by mouth 2 (two) times daily.   Lantus SoloStar 100 UNIT/ML Solostar Pen Generic drug: insulin glargine INJECT  25 UNITS INTO THE SKIN DAILY AT 10 PM.   NIFEdipine 90 MG 24 hr tablet Commonly known as: PROCARDIA XL/NIFEDICAL-XL   nitroGLYCERIN 0.4 MG SL tablet Commonly known as: NITROSTAT Place 1 tablet (0.4 mg total) under the tongue every 5 (five) minutes as needed for chest pain.   pravastatin 20 MG tablet Commonly known as: PRAVACHOL Take 1 tablet (20 mg total) by mouth daily.   torsemide 20 MG tablet Commonly known as: DEMADEX Take 40 mg by mouth daily.       History (reviewed): Past Medical History:  Diagnosis Date  . Acute on chronic diastolic CHF (congestive heart failure) ([place]) 03/05/2015   Grade 2. EF 60-65%  . Anemia    hx of  . Anxiety   . Arthritis   . CAD (coronary artery disease)    s/p Stenting in 2005;  [place] 7/09: Vigorous LV function, 2-3+ MR, RI 40%, RI stent patent, proximal-mid RCA 40-50%;  Echo 7/09:  EF 55-65%, trivial MR;   Myoview 11/18/12: EF 66%, normal LV wall motion, mild to moderate anterior ischemia - reviewed by [facility] and felt to rep breast atten (low risk);  Echo 6/14: mild LVH, EF 55-65%, Gr 2 DD, PASP 44, trivial eff   . Chronic kidney disease    CREATININE IS UP--GOING TO KIDNEY MD   . Chronic neck pain   . COPD (chronic obstructive pulmonary disease) ([place])   . Degenerative joint disease    left shoulder  . Diabetic neuropathy ([place])   . Diverticulosis   . Esophageal stricture   . GERD (gastroesophageal reflux disease)   . Hyperlipidemia   . Hypertension   . IDDM (insulin dependent diabetes mellitus)   . Pericardial effusion 03/06/2015   Very small per echo ; pleural nodules seen on CT chest.  . Peripheral vascular disease ([place])   . Stroke [facility])    "mini- years ago"  . Vitamin D deficiency    Past Surgical History:  Procedure Laterality Date  . A/V FISTULAGRAM N/A 08/10/2017  Procedure: A/V Fistulagram - Right Arm;  Surgeon: Elam Dutch, MD;  Location: [place] [facility];  Service: Cardiovascular;  Laterality: N/A;  . A/V FISTULAGRAM N/A 03/03/2020   Procedure: A/V FISTULAGRAM - Right Arm;  Surgeon: Marty Heck, MD;  Location: [place] [facility];  Service: Cardiovascular;  Laterality: N/A;  . ABDOMINAL HYSTERECTOMY    . AV FISTULA PLACEMENT Right 07/20/2015   Procedure: RADIAL CEPHALIC ARTERIOVENOUS FISTULA CREATION RIGHT ARM;  Surgeon: Angelia Mould, MD;  Location: [place];  Service: Vascular;  Laterality: Right;  . AV FISTULA PLACEMENT Right 11/26/2015   Procedure:  Creation of RIGHT BRACHIO-CEPHALIC arteriovenous fistula;  Surgeon: Angelia Mould, MD;  Location: [place];  Service: Vascular;  Laterality: Right;  . BACK SURGERY     2007 & 2013  . BLADDER SURGERY     Tack  . CARDIAC CATHETERIZATION  2003, 2005, 2009   1 stent  . cardiac stents    . CATARACT EXTRACTION W/PHACO Right 05/04/2014   Procedure: CATARACT EXTRACTION PHACO AND INTRAOCULAR LENS PLACEMENT (IOC);   Surgeon: Williams Che, MD;  Location: [facility];  Service: Ophthalmology;  Laterality: Right;  CDE 1.47  . CATARACT EXTRACTION W/PHACO Left 07/20/2014   Procedure: CATARACT EXTRACTION WITH PHACO AND INTRAOCULAR LENS PLACEMENT LEFT EYE CDE=1.79;  Surgeon: Williams Che, MD;  Location: [facility];  Service: Ophthalmology;  Laterality: Left;  . CERVICAL SPINE SURGERY  2012   8 screws  . CYSTOSCOPY WITH INJECTION N/A 06/03/2013   Procedure: MACROPLASTIQUE  INJECTION ;  Surgeon: Malka So, MD;  Location: [facility];  Service: Urology;  Laterality: N/A;  . FISTULOGRAM Right 10/20/2016   Procedure: FISTULOGRAM WITH POSSIBLE INTERVENTION;  Surgeon: [name], MD;  Location: [facility];  Service: Vascular;  Laterality: Right;  . INSERTION OF DIALYSIS CATHETER N/A 11/26/2015   Procedure: INSERTION OF DIALYSIS CATHETER;  Surgeon: Angelia Mould, MD;  Location: [place];  Service: Vascular;  Laterality: N/A;  . LAMINECTOMY  12/29/2011  . LUMBAR LAMINECTOMY/DECOMPRESSION MICRODISCECTOMY  12/29/2011   Procedure: LUMBAR LAMINECTOMY/DECOMPRESSION MICRODISCECTOMY;  Surgeon: Floyce Stakes, MD;  Location: [place] [facility];  Service: Neurosurgery;  Laterality: N/A;  Lumbar Two through Lumbar Five Laminectomies/Cellsaver  . PARTIAL HYSTERECTOMY    . PERIPHERAL VASCULAR BALLOON ANGIOPLASTY  03/03/2020   Procedure: PERIPHERAL VASCULAR BALLOON ANGIOPLASTY;  Surgeon: Marty Heck, MD;  Location: [place] [facility];  Service: Cardiovascular;;  Rt arm fistula  . PERIPHERAL VASCULAR CATHETERIZATION N/A 11/22/2015   Procedure: Fistulagram;  Surgeon: Angelia Mould, MD;  Location: [place] [facility];  Service: Cardiovascular;  Laterality: N/A;  . PERIPHERAL VASCULAR INTERVENTION  08/10/2017   Procedure: PERIPHERAL VASCULAR INTERVENTION;  Surgeon: Elam Dutch, MD;  Location: [place] [facility];  Service: Cardiovascular;;  . SHOULDER SURGERY Right    Rotator cuff   Family History  Problem Relation Age of  Onset  . Heart disease Other   . Hypertension Father   . Cancer Father   . Hypertension Mother   . Hypertension Daughter   . Pancreatitis Brother   . Hypertension Daughter   . Diabetes Daughter    Social History   Socioeconomic History  . Marital status: Widowed    Spouse name: Not on file  . Number of children: 2  . Years of education: GED  . Highest education level: GED or equivalent  Occupational History  . Occupation: Disabled  Tobacco Use  . Smoking status: Former Smoker  Packs/day: 1.00    Years: 30.00    Pack years: 30.00    Quit date: 12/04/2001    Years since quitting: 18.4  . Smokeless tobacco: Never Used  Vaping Use  . Vaping Use: Never used  Substance and Sexual Activity  . Alcohol use: No    Alcohol/week: 6.0 standard drinks    Types: 6 Cans of beer per week  . Drug use: No  . Sexual activity: Not Currently    Birth control/protection: Surgical  Other Topics Concern  . Not on file  Social History Narrative   Lives in [place]   Social Determinants of Health   Financial Resource Strain: Low Risk   . Difficulty of Paying Living Expenses: Not very hard  Food Insecurity: No Food Insecurity  . Worried About Charity fundraiser in the Last Year: Never true  . Ran Out of Food in the Last Year: Never true  Transportation Needs: No Transportation Needs  . Lack of Transportation (Medical): No  . Lack of Transportation (Non-Medical): No  Physical Activity: Inactive  . Days of Exercise per Week: 0 days  . Minutes of Exercise per Session: 0 min  Stress: No Stress Concern Present  . Feeling of Stress : Not at all  Social Connections: Moderately Integrated  . Frequency of Communication with Friends and Family: More than three times a week  . Frequency of Social Gatherings with Friends and Family: More than three times a week  . Attends Religious Services: More than 4 times per year  . Active Member of Clubs or Organizations: Yes  . Attends Theatre manager Meetings: More than 4 times per year  . Marital Status: Widowed    Activities of Daily Living In your present state of health, do you have any difficulty performing the following activities: 05/24/2020 02/10/2020  Hearing? N N  Vision? N Y  Comment wears glasses -  Difficulty concentrating or making decisions? N N  Walking or climbing stairs? Y Y  Comment at times climbing stairs due to legs lower leg pain  Dressing or bathing? N N  Doing errands, shopping? N N  Preparing Food and eating ? N N  Using the Toilet? N N  In the past six months, have you accidently leaked urine? Y N  Comment Patient states she has had problems for years -  Do you have problems with loss of bowel control? N N  Managing your Medications? N N  Managing your Finances? N N  Housekeeping or managing your Housekeeping? N N  Some recent data might be hidden    Patient Education/ Literacy How often do you need to have someone help you when you read instructions, pamphlets, or other written materials from your doctor or pharmacy?: 1 - Never What is the last grade level you completed in school?: GED  Exercise Current Exercise Habits: The patient does not participate in regular exercise at present  Diet Patient reports consuming 2 meals a day and 3 snack(s) a day Patient reports that her primary diet is: Regular Patient reports that she does have regular access to food.   Depression Screen PHQ 2/9 Scores 05/24/2020 05/20/2020 04/19/2020 01/20/2020 10/15/2019 07/14/2019 04/21/2019  PHQ - 2 Score 0 0 0 0 0 0 0  PHQ- 9 Score 5 - 4 - - - -     Fall Risk Fall Risk  05/24/2020 05/20/2020 04/19/2020 01/20/2020 10/15/2019  Falls in the past year? 0 0 0 0 0  Number falls  in past yr: - 0 0 - -  Injury with Fall? - 0 0 - -  Risk for fall due to : - No Fall Risks No Fall Risks - -  Follow up - Falls evaluation completed Falls evaluation completed - -     Objective:  FIZZA SCALES seemed alert and oriented  and she participated appropriately during our telephone visit.  Blood Pressure Weight BMI  BP Readings from Last 3 Encounters:  05/20/20 131/69  04/19/20 133/67  03/03/20 137/86   Wt Readings from Last 3 Encounters:  05/20/20 179 lb 3.2 oz (81.3 kg)  04/19/20 178 lb 6.4 oz (80.9 kg)  03/03/20 175 lb (79.4 kg)   BMI Readings from Last 1 Encounters:  05/20/20 31.74 kg/m    *Unable to obtain current vital signs, weight, and BMI due to telephone visit type  Hearing/Vision  . Erlinda did not seem to have difficulty with hearing/understanding during the telephone conversation . Reports that she has had a formal eye exam by an eye care professional within the past year . Reports that she has not had a formal hearing evaluation within the past year *Unable to fully assess hearing and vision during telephone visit type  Cognitive Function: 6CIT Screen 05/24/2020  What Year? 0 points  What month? 0 points  What time? 0 points  Count back from 20 0 points  Months in reverse 0 points  Repeat phrase 0 points  Total Score 0   (Normal:0-7, Significant for Dysfunction: >8)  Normal Cognitive Function Screening: Yes   Immunization & Health Maintenance Record Immunization History  Administered Date(s) Administered  . DTaP 07/19/2010  . Hepatitis B, adult 12/04/2015, 01/04/2016, 02/08/2016, 06/01/2016, 10/17/2016, 11/11/2016, 12/16/2016, 04/12/2017, 04/08/2019  . Influenza Split 10/04/2013  . Influenza Whole 09/03/2006  . Influenza,inj,Quad PF,6+ Mos 09/11/2013, 09/09/2014  . Influenza-Unspecified 09/14/2015, 08/25/2016, 08/21/2019  . Moderna SARS-COVID-2 Vaccination 02/09/2020, 03/11/2020  . PPD Test 11/23/2015, 12/26/2016, 01/03/2018, 01/09/2019, 04/20/2020  . Pneumococcal Conjugate-13 07/07/2017  . Pneumococcal Polysaccharide-23 08/26/2012, 07/07/2017, 12/13/2018  . Pneumococcal-Unspecified 08/26/2012, 07/07/2017  . Td 07/19/2010  . Tdap 08/24/2011  . Zoster Recombinat (Shingrix)  09/09/2019, 11/10/2019    Health Maintenance  Topic Date Due  . MAMMOGRAM  12/24/2019  . INFLUENZA VACCINE  07/04/2020  . OPHTHALMOLOGY EXAM  08/05/2020  . FOOT EXAM  10/14/2020  . HEMOGLOBIN A1C  10/20/2020  . TETANUS/TDAP  08/23/2021  . COLONOSCOPY  11/03/2024  . DEXA SCAN  Completed  . COVID-19 Vaccine  Completed  . Hepatitis C Screening  Completed  . PNA vac Low Risk Adult  Completed       Assessment  This is a routine wellness examination for [facility].  Health Maintenance: Due or Overdue Health Maintenance Due  Topic Date Due  . MAMMOGRAM  12/24/2019    [name] does not need a referral for Community Assistance: Patient is currently enrolled.  Care Management:   no Social Work:    no Prescription Assistance:  no Nutrition/Diabetes Education:  no   Plan:   Goals Addressed            This Visit's Progress   . AWV       05/24/2020 AWV Goal: Exercise for General Health   Patient will verbalize understanding of the benefits of increased physical activity:  Exercising regularly is important. It will improve your overall fitness, flexibility, and endurance.  Regular exercise also will improve your overall health. It can help you control your weight, reduce  stress, and improve your bone density.  Over the next year, patient will increase physical activity as tolerated with a goal of at least 150 minutes of moderate physical activity per week.   You can tell that you are exercising at a moderate intensity if your heart starts beating faster and you start breathing faster but can still hold a conversation.  Moderate-intensity exercise ideas include:  Walking 1 mile (1.6 km) in about 15 minutes  Biking  Hiking  Golfing  Dancing  Water aerobics  Patient will verbalize understanding of everyday activities that increase physical activity by providing examples like the following: ? Yard work, such as: ? Pushing a Conservation officer, nature ? Raking and  bagging leaves ? Washing your car ? Pushing a stroller ? Shoveling snow ? Gardening ? Washing windows or floors  Patient will be able to explain general safety guidelines for exercising:   Before you start a new exercise program, talk with your health care provider.  Do not exercise so much that you hurt yourself, feel dizzy, or get very short of breath.  Wear comfortable clothes and wear shoes with good support.  Drink plenty of water while you exercise to prevent dehydration or heat stroke.  Work out until your breathing and your heartbeat get faster.        Personalized Health Maintenance & Screening Recommendations  Screening mammography  Lung Cancer Screening Recommended: no (Low Dose CT Chest recommended if Age 17-80 years, 30 pack-year currently smoking OR have quit w/in past 15 years) Hepatitis C Screening recommended: no HIV Screening recommended: no  Advanced Directives: Written information was not prepared per patient's request.  Referrals & Orders No orders of the defined types were placed in this encounter.   Follow-up Plan . Follow-up with [name], MD as planned . Schedule yearly mammogram  . AVS printed and mailed to patient   I have personally reviewed and noted the following in the patient's chart:   . Medical and social history . Use of alcohol, tobacco or illicit drugs  . Current medications and supplements . Functional ability and status . Nutritional status . Physical activity . Advanced directives . List of other physicians . Hospitalizations, surgeries, and ER visits in previous 12 months . Vitals . Screenings to include cognitive, depression, and falls . Referrals and appointments  In addition, I have reviewed and discussed with [name] certain preventive protocols, quality metrics, and best practice recommendations. A written personalized care plan for preventive services as well as general preventive health  recommendations is available and can be mailed to the patient at her request.      Lynnea Ferrier, LPN  0/30/0923

## 2020-05-24 NOTE — Patient Instructions (Signed)
[facility] Maintenance Summary and Written Plan of Care  Ms. Uncapher ,  Thank you for allowing me to perform your Medicare Annual Wellness Visit and for your ongoing commitment to your health.   Health Maintenance & Immunization History Health Maintenance  Topic Date Due  . MAMMOGRAM  12/24/2019  . INFLUENZA VACCINE  07/04/2020  . OPHTHALMOLOGY EXAM  08/05/2020  . FOOT EXAM  10/14/2020  . HEMOGLOBIN A1C  10/20/2020  . TETANUS/TDAP  08/23/2021  . COLONOSCOPY  11/03/2024  . DEXA SCAN  Completed  . COVID-19 Vaccine  Completed  . Hepatitis C Screening  Completed  . PNA vac Low Risk Adult  Completed   Immunization History  Administered Date(s) Administered  . DTaP 07/19/2010  . Hepatitis B, adult 12/04/2015, 01/04/2016, 02/08/2016, 06/01/2016, 10/17/2016, 11/11/2016, 12/16/2016, 04/12/2017, 04/08/2019  . Influenza Split 10/04/2013  . Influenza Whole 09/03/2006  . Influenza,inj,Quad PF,6+ Mos 09/11/2013, 09/09/2014  . Influenza-Unspecified 09/14/2015, 08/25/2016, 08/21/2019  . Moderna SARS-COVID-2 Vaccination 02/09/2020, 03/11/2020  . PPD Test 11/23/2015, 12/26/2016, 01/03/2018, 01/09/2019, 04/20/2020  . Pneumococcal Conjugate-13 07/07/2017  . Pneumococcal Polysaccharide-23 08/26/2012, 07/07/2017, 12/13/2018  . Pneumococcal-Unspecified 08/26/2012, 07/07/2017  . Td 07/19/2010  . Tdap 08/24/2011  . Zoster Recombinat (Shingrix) 09/09/2019, 11/10/2019    These are the patient goals that we discussed: Goals Addressed            This Visit's Progress   . AWV       05/24/2020 AWV Goal: Exercise for General Health   Patient will verbalize understanding of the benefits of increased physical activity:  Exercising regularly is important. It will improve your overall fitness, flexibility, and endurance.  Regular exercise also will improve your overall health. It can help you control your weight, reduce stress, and improve your bone density.  Over the  next year, patient will increase physical activity as tolerated with a goal of at least 150 minutes of moderate physical activity per week.   You can tell that you are exercising at a moderate intensity if your heart starts beating faster and you start breathing faster but can still hold a conversation.  Moderate-intensity exercise ideas include:  Walking 1 mile (1.6 km) in about 15 minutes  Biking  Hiking  Golfing  Dancing  Water aerobics  Patient will verbalize understanding of everyday activities that increase physical activity by providing examples like the following: ? Yard work, such as: ? Pushing a Conservation officer, nature ? Raking and bagging leaves ? Washing your car ? Pushing a stroller ? Shoveling snow ? Gardening ? Washing windows or floors  Patient will be able to explain general safety guidelines for exercising:   Before you start a new exercise program, talk with your health care provider.  Do not exercise so much that you hurt yourself, feel dizzy, or get very short of breath.  Wear comfortable clothes and wear shoes with good support.  Drink plenty of water while you exercise to prevent dehydration or heat stroke.  Work out until your breathing and your heartbeat get faster.         This is a list of Health Maintenance Items that are overdue or due now: Health Maintenance Due  Topic Date Due  . MAMMOGRAM  12/24/2019     Orders/Referrals Placed Today: No orders of the defined types were placed in this encounter.  (Contact our referral department at [phone] if you have not spoken with someone about your referral appointment within the next 5 days)  Follow-up Plan  . Follow-up with [name], MD as planned . Schedule yearly mammogram  . AVS printed and mailed to patient

## 2020-06-01 DIAGNOSIS — Z992 Dependence on renal dialysis: Secondary | ICD-10-CM | POA: Diagnosis not present

## 2020-06-01 DIAGNOSIS — E119 Type 2 diabetes mellitus without complications: Secondary | ICD-10-CM | POA: Diagnosis not present

## 2020-06-01 DIAGNOSIS — Z794 Long term (current) use of insulin: Secondary | ICD-10-CM | POA: Diagnosis not present

## 2020-06-02 DIAGNOSIS — Z992 Dependence on renal dialysis: Secondary | ICD-10-CM | POA: Diagnosis not present

## 2020-06-02 DIAGNOSIS — N186 End stage renal disease: Secondary | ICD-10-CM | POA: Diagnosis not present

## 2020-06-03 DIAGNOSIS — D509 Iron deficiency anemia, unspecified: Secondary | ICD-10-CM | POA: Diagnosis not present

## 2020-06-03 DIAGNOSIS — N2581 Secondary hyperparathyroidism of renal origin: Secondary | ICD-10-CM | POA: Diagnosis not present

## 2020-06-03 DIAGNOSIS — D631 Anemia in chronic kidney disease: Secondary | ICD-10-CM | POA: Diagnosis not present

## 2020-06-03 DIAGNOSIS — N186 End stage renal disease: Secondary | ICD-10-CM | POA: Diagnosis not present

## 2020-06-03 DIAGNOSIS — Z992 Dependence on renal dialysis: Secondary | ICD-10-CM | POA: Diagnosis not present

## 2020-06-09 ENCOUNTER — Telehealth: Payer: Self-pay | Admitting: Family Medicine

## 2020-06-09 ENCOUNTER — Other Ambulatory Visit: Payer: Self-pay | Admitting: Family Medicine

## 2020-06-09 MED ORDER — TRIAMCINOLONE ACETONIDE 0.1 % EX CREA: 1.0000 "application " | TOPICAL_CREAM | Freq: Three times a day (TID) | CUTANEOUS | 0 refills | Status: DC

## 2020-06-09 MED ORDER — CETIRIZINE HCL 10 MG PO TABS: 10.0000 mg | ORAL_TABLET | Freq: Every day | ORAL | 1 refills | Status: DC | PRN

## 2020-06-09 NOTE — Telephone Encounter (Signed)
Patient aware, script is ready. 

## 2020-06-09 NOTE — Telephone Encounter (Signed)
Done. Thanks, WS 

## 2020-06-23 ENCOUNTER — Ambulatory Visit: Payer: Medicare Other | Admitting: *Deleted

## 2020-06-23 DIAGNOSIS — E1159 Type 2 diabetes mellitus with other circulatory complications: Secondary | ICD-10-CM

## 2020-06-23 DIAGNOSIS — E1121 Type 2 diabetes mellitus with diabetic nephropathy: Secondary | ICD-10-CM

## 2020-06-23 DIAGNOSIS — N186 End stage renal disease: Secondary | ICD-10-CM

## 2020-06-23 NOTE — Chronic Care Management (AMB) (Signed)
  Chronic Care Management   Follow-up Outreach  06/23/2020 Name: [name] MRN: [id number] DOB: [birth year]  Referred by: [name], MD Reason for referral : Chronic Care Management (RN follow up)   An unsuccessful telephone follow-up was attempted today. The patient was referred to the case management team for assistance with care management and care coordination.   Follow Up Plan: A HIPPA compliant phone message was left for the patient providing contact information and requesting a return call.  The care management team will reach out to the patient again over the next 60 days.   Chong Sicilian, BSN, RN-BC Embedded Chronic Care Manager [facility] Direct Dial: [phone]

## 2020-06-29 DIAGNOSIS — Z992 Dependence on renal dialysis: Secondary | ICD-10-CM | POA: Diagnosis not present

## 2020-07-03 ENCOUNTER — Other Ambulatory Visit: Payer: Self-pay | Admitting: Nurse Practitioner

## 2020-07-03 DIAGNOSIS — Z992 Dependence on renal dialysis: Secondary | ICD-10-CM | POA: Diagnosis not present

## 2020-07-03 DIAGNOSIS — E1169 Type 2 diabetes mellitus with other specified complication: Secondary | ICD-10-CM

## 2020-07-03 DIAGNOSIS — N186 End stage renal disease: Secondary | ICD-10-CM | POA: Diagnosis not present

## 2020-07-03 DIAGNOSIS — E785 Hyperlipidemia, unspecified: Secondary | ICD-10-CM

## 2020-07-06 DIAGNOSIS — Z992 Dependence on renal dialysis: Secondary | ICD-10-CM | POA: Diagnosis not present

## 2020-07-06 DIAGNOSIS — N186 End stage renal disease: Secondary | ICD-10-CM | POA: Diagnosis not present

## 2020-07-06 DIAGNOSIS — D509 Iron deficiency anemia, unspecified: Secondary | ICD-10-CM | POA: Diagnosis not present

## 2020-07-06 DIAGNOSIS — N2581 Secondary hyperparathyroidism of renal origin: Secondary | ICD-10-CM | POA: Diagnosis not present

## 2020-07-06 DIAGNOSIS — D631 Anemia in chronic kidney disease: Secondary | ICD-10-CM | POA: Diagnosis not present

## 2020-07-21 ENCOUNTER — Telehealth: Payer: Self-pay | Admitting: Family Medicine

## 2020-07-21 ENCOUNTER — Ambulatory Visit (INDEPENDENT_AMBULATORY_CARE_PROVIDER_SITE_OTHER): Payer: Medicare Other | Admitting: Family Medicine

## 2020-07-21 ENCOUNTER — Other Ambulatory Visit: Payer: Self-pay

## 2020-07-21 ENCOUNTER — Encounter: Payer: Self-pay | Admitting: Family Medicine

## 2020-07-21 ENCOUNTER — Telehealth: Payer: Self-pay | Admitting: Pharmacist

## 2020-07-21 VITALS — BP 132/62 | HR 68 | Temp 98.1°F | Resp 20 | Ht 63.0 in | Wt 188.2 lb

## 2020-07-21 DIAGNOSIS — I10 Essential (primary) hypertension: Secondary | ICD-10-CM | POA: Diagnosis not present

## 2020-07-21 DIAGNOSIS — E1169 Type 2 diabetes mellitus with other specified complication: Secondary | ICD-10-CM | POA: Diagnosis not present

## 2020-07-21 DIAGNOSIS — E1159 Type 2 diabetes mellitus with other circulatory complications: Secondary | ICD-10-CM | POA: Diagnosis not present

## 2020-07-21 DIAGNOSIS — E1121 Type 2 diabetes mellitus with diabetic nephropathy: Secondary | ICD-10-CM | POA: Diagnosis not present

## 2020-07-21 DIAGNOSIS — E785 Hyperlipidemia, unspecified: Secondary | ICD-10-CM

## 2020-07-21 DIAGNOSIS — I2510 Atherosclerotic heart disease of native coronary artery without angina pectoris: Secondary | ICD-10-CM | POA: Diagnosis not present

## 2020-07-21 LAB — BAYER DCA HB A1C WAIVED: HB A1C (BAYER DCA - WAIVED): 7.2 % — ABNORMAL HIGH (ref <7.0)

## 2020-07-21 MED ORDER — ONDANSETRON HCL 4 MG PO TABS: 4.0000 mg | ORAL_TABLET | Freq: Three times a day (TID) | ORAL | 0 refills | Status: DC | PRN

## 2020-07-21 MED ORDER — TRULICITY 0.75 MG/0.5ML ~~LOC~~ SOAJ: 0.7500 mg | SUBCUTANEOUS | 2 refills | Status: DC

## 2020-07-21 MED ORDER — LANTUS SOLOSTAR 100 UNIT/ML ~~LOC~~ SOPN: PEN_INJECTOR | SUBCUTANEOUS | 4 refills | Status: DC

## 2020-07-21 NOTE — Telephone Encounter (Signed)
New start trulicity Start at 0.75mg  sq weekly Demonstration provided; patient was able to use demonstration pen for correction injection technique Will have zofran to use for potential nausea Denies h/o thyroid cancer

## 2020-07-21 NOTE — Progress Notes (Signed)
Subjective:  Patient ID: [name], female    DOB: 1950/12/11  Age: 69 y.o. MRN: [id number]  CC: Medical Management of Chronic Issues   HPI [name] presents for recheck of diabetes. Pt states that readings after eating are running rather high. Okay fasting. Using 25 units if Lantus each day. Denies hypoglycemia. She gets hemodialysis every TThSa.  Depression screen [facility] 2/9 07/21/2020 05/24/2020 05/20/2020  Decreased Interest 0 0 0  Down, Depressed, Hopeless 0 0 0  PHQ - 2 Score 0 0 0  Altered sleeping - 3 -  Tired, decreased energy - 2 -  Change in appetite - 0 -  Feeling bad or failure about yourself  - 0 -  Trouble concentrating - 0 -  Moving slowly or fidgety/restless - 0 -  Suicidal thoughts - 0 -  PHQ-9 Score - 5 -  Some recent data might be hidden    History [name] has a past medical history of Acute on chronic diastolic CHF (congestive heart failure) ([place]) (03/05/2015), Anemia, Anxiety, Arthritis, CAD (coronary artery disease), Chronic kidney disease, Chronic neck pain, COPD (chronic obstructive pulmonary disease) (HCC), Degenerative joint disease, Diabetic neuropathy ([place]), Diverticulosis, Esophageal stricture, GERD (gastroesophageal reflux disease), Hyperlipidemia, Hypertension, IDDM (insulin dependent diabetes mellitus), Pericardial effusion (03/06/2015), Peripheral vascular disease ([place]), Stroke ([place]), and Vitamin D deficiency.   She has a past surgical history that includes Bladder surgery; Partial hysterectomy; Shoulder surgery (Right); Cervical spine surgery (2012); Abdominal hysterectomy; Laminectomy (12/29/2011); cardiac stents; Lumbar laminectomy/decompression microdiscectomy (12/29/2011); Back surgery; Cystoscopy with injection (N/A, 06/03/2013); Cardiac catheterization (2003, 2005, 2009); Cataract extraction w/PHACO (Right, 05/04/2014); Cataract extraction w/PHACO (Left, 07/20/2014); AV fistula placement (Right, 07/20/2015); Cardiac catheterization (N/A, 11/22/2015);  Insertion of dialysis catheter (N/A, 11/26/2015); AV fistula placement (Right, 11/26/2015); Fistulogram (Right, 10/20/2016); A/V Fistulagram (N/A, 08/10/2017); PERIPHERAL VASCULAR INTERVENTION (08/10/2017); A/V Fistulagram (N/A, 03/03/2020); and PERIPHERAL VASCULAR BALLOON ANGIOPLASTY (03/03/2020).   Her family history includes Cancer in her father; Diabetes in her daughter; Heart disease in an other family member; Hypertension in her daughter, daughter, father, and mother; Pancreatitis in her brother.She reports that she quit smoking about 18 years ago. She has a 30.00 pack-year smoking history. She has never used smokeless tobacco. She reports that she does not drink alcohol and does not use drugs.    ROS Review of Systems  Constitutional: Negative.   HENT: Negative for congestion.   Eyes: Negative for visual disturbance.  Respiratory: Negative for shortness of breath.   Cardiovascular: Negative for chest pain.  Gastrointestinal: Negative for abdominal pain, constipation, diarrhea, nausea and vomiting.  Genitourinary: Negative for difficulty urinating.  Musculoskeletal: Negative for arthralgias and myalgias.  Neurological: Negative for headaches.  Psychiatric/Behavioral: Negative for sleep disturbance.    Objective:  BP 132/62   Pulse 68   Temp 98.1 F (36.7 C) (Temporal)   Resp 20   Ht _0  (1.6 m)   Wt 188 lb 4 oz (85.4 kg)   SpO2 99%   BMI 33.35 kg/m   BP Readings from Last 3 Encounters:  07/21/20 132/62  05/20/20 131/69  04/19/20 133/67    Wt Readings from Last 3 Encounters:  07/21/20 188 lb 4 oz (85.4 kg)  05/20/20 179 lb 3.2 oz (81.3 kg)  04/19/20 178 lb 6.4 oz (80.9 kg)     Physical Exam Constitutional:      General: She is not in acute distress.    Appearance: She is well-developed.  HENT:     Head: Normocephalic and atraumatic.  Eyes:     Conjunctiva/sclera: Conjunctivae normal.     Pupils: Pupils are equal, round, and reactive to light.  Neck:      Thyroid: No thyromegaly.  Cardiovascular:     Rate and Rhythm: Normal rate and regular rhythm.     Heart sounds: Normal heart sounds. No murmur heard.   Pulmonary:     Effort: Pulmonary effort is normal. No respiratory distress.     Breath sounds: Normal breath sounds. No wheezing or rales.  Abdominal:     General: Bowel sounds are normal. There is no distension.     Palpations: Abdomen is soft.     Tenderness: There is no abdominal tenderness.  Musculoskeletal:        General: Normal range of motion.     Cervical back: Normal range of motion and neck supple.  Lymphadenopathy:     Cervical: No cervical adenopathy.  Skin:    General: Skin is warm and dry.  Neurological:     Mental Status: She is alert and oriented to person, place, and time.  Psychiatric:        Behavior: Behavior normal.        Thought Content: Thought content normal.        Judgment: Judgment normal.       Assessment & Plan:   [name] was seen today for medical management of chronic issues.  Diagnoses and all orders for this visit:  Hypertension associated with diabetes ([place]) -     CBC with Differential/Platelet -     CMP14+EGFR -     Lipid panel  Type II diabetes mellitus with nephropathy (HCC) -     Bayer DCA Hb A1c Waived -     CBC with Differential/Platelet -     CMP14+EGFR -     Lipid panel -     insulin glargine (LANTUS SOLOSTAR) 100 UNIT/ML Solostar Pen; INJECT  20 UNITS INTO THE SKIN DAILY AT 10 PM.  Hyperlipidemia associated with type 2 diabetes mellitus (HCC) -     CBC with Differential/Platelet -     CMP14+EGFR -     Lipid panel  Other orders -     Dulaglutide (TRULICITY) 6.75 QG/9.2EF SOPN; Inject 0.5 mLs (0.75 mg total) into the skin once a week.       I have changed [name]. [name] Lantus SoloStar. I am also having her start on Trulicity. Additionally, I am having her maintain her aspirin, calcium acetate, torsemide, albuterol, albuterol, budesonide-formoterol,  hydrALAZINE, nitroGLYCERIN, busPIRone, atenolol, acetaminophen, guaiFENesin, Dialyvite 800, NIFEdipine, gabapentin, triamcinolone cream, cetirizine, and pravastatin.  Allergies as of 07/21/2020      Reactions   Azithromycin    Prolonged QT   Ace Inhibitors Cough   Clopidogrel Bisulfate Other (See Comments)   Sick, Headache, "Felt terrible"   Codeine Other (See Comments)   hallucinations   Lisinopril Cough   Penicillins Other (See Comments)   Did it involve swelling of the face/tongue/throat, SOB, or low BP? Unknown Did it involve sudden or severe rash/hives, skin peeling, or any reaction on the inside of your mouth or nose? Unknown Did you need to seek medical attention at a hospital or doctor's office? Unknown When did it last happen?childhood allergy If all above answers are "NO", may proceed with cephalosporin use.      Medication List       Accurate as of July 21, 2020  9:28 PM. If you have any questions, ask your nurse or doctor.  acetaminophen 500 MG tablet Commonly known as: TYLENOL Take 1,000 mg by mouth every 6 (six) hours as needed for moderate pain or headache.   albuterol 108 (90 Base) MCG/ACT inhaler Commonly known as: VENTOLIN HFA Inhale 2 puffs into the lungs every 6 (six) hours as needed for wheezing or shortness of breath.   albuterol (2.5 MG/3ML) 0.083% nebulizer solution Commonly known as: PROVENTIL Take 3 mLs (2.5 mg total) by nebulization every 6 (six) hours as needed for wheezing or shortness of breath.   aspirin 81 MG EC tablet Take 81 mg by mouth daily.   atenolol 50 MG tablet Commonly known as: TENORMIN Take 1 tablet by mouth twice daily   budesonide-formoterol 160-4.5 MCG/ACT inhaler Commonly known as: SYMBICORT Inhale 2 puffs into the lungs 2 (two) times daily as needed. What changed: reasons to take this   busPIRone 7.5 MG tablet Commonly known as: BUSPAR Take 1 tablet (7.5 mg total) by mouth 3 (three) times daily as  needed. What changed: reasons to take this   calcium acetate 667 MG capsule Commonly known as: PHOSLO Take 667-1,334 mg by mouth See admin instructions. Take 1334 mg with meals 3 times daily and 667 mg with snacks   cetirizine 10 MG tablet Commonly known as: ZYRTEC Take 1 tablet (10 mg total) by mouth daily as needed (itch).   Dialyvite 800 0.8 MG Tabs Take 1 tablet by mouth daily.   gabapentin 300 MG capsule Commonly known as: NEURONTIN Take two after each dialysis   guaiFENesin 600 MG 12 hr tablet Commonly known as: MUCINEX Take 600 mg by mouth 2 (two) times daily as needed (congestion).   hydrALAZINE 50 MG tablet Commonly known as: APRESOLINE Take 1 tablet (50 mg total) by mouth 2 (two) times daily.   Lantus SoloStar 100 UNIT/ML Solostar Pen Generic drug: insulin glargine INJECT  20 UNITS INTO THE SKIN DAILY AT 10 PM. What changed: additional instructions Changed by: [name], MD   NIFEdipine 90 MG 24 hr tablet Commonly known as: PROCARDIA XL/NIFEDICAL-XL   nitroGLYCERIN 0.4 MG SL tablet Commonly known as: NITROSTAT Place 1 tablet (0.4 mg total) under the tongue every 5 (five) minutes as needed for chest pain.   ondansetron 4 MG tablet Commonly known as: Zofran Take 1 tablet (4 mg total) by mouth every 8 (eight) hours as needed for nausea or vomiting. Started by: Lavera Guise, RPH   pravastatin 20 MG tablet Commonly known as: PRAVACHOL Take 1 tablet by mouth once daily   torsemide 20 MG tablet Commonly known as: DEMADEX Take 40 mg by mouth daily.   triamcinolone cream 0.1 % Commonly known as: KENALOG Apply 1 application topically 3 (three) times daily. Avoid face and genitalia   Trulicity 0.37 CW/8.8QB Sopn Generic drug: Dulaglutide Inject 0.5 mLs (0.75 mg total) into the skin once a week. Started by: [name], MD        Follow-up: No follow-ups on file.  [name], M.D.

## 2020-07-21 NOTE — Telephone Encounter (Signed)
[name] said we just called in Trulicity Rx for her to the pharmacy and says it is way too much money and she cant afford it. Michela Pitcher it is $133 for a month supply. Wants to know what her other options are. Requested to speak with Almyra Free about it.

## 2020-07-22 LAB — CMP14+EGFR
ALT: 16 IU/L (ref 0–32)
AST: 14 IU/L (ref 0–40)
Albumin/Globulin Ratio: 2.1 (ref 1.2–2.2)
Albumin: 4.2 g/dL (ref 3.8–4.8)
Alkaline Phosphatase: 135 IU/L — ABNORMAL HIGH (ref 48–121)
BUN/Creatinine Ratio: 6 — ABNORMAL LOW (ref 12–28)
BUN: 47 mg/dL — ABNORMAL HIGH (ref 8–27)
Bilirubin Total: 0.3 mg/dL (ref 0.0–1.2)
CO2: 26 mmol/L (ref 20–29)
Calcium: 8.7 mg/dL (ref 8.7–10.3)
Chloride: 100 mmol/L (ref 96–106)
Creatinine, Ser: 7.87 mg/dL (ref 0.57–1.00)
GFR calc Af Amer: 6 mL/min/{1.73_m2} — ABNORMAL LOW (ref >59)
GFR calc non Af Amer: 5 mL/min/{1.73_m2} — ABNORMAL LOW (ref >59)
Globulin, Total: 2 g/dL (ref 1.5–4.5)
Glucose: 153 mg/dL — ABNORMAL HIGH (ref 65–99)
Potassium: 4.6 mmol/L (ref 3.5–5.2)
Sodium: 141 mmol/L (ref 134–144)
Total Protein: 6.2 g/dL (ref 6.0–8.5)

## 2020-07-22 LAB — CBC WITH DIFFERENTIAL/PLATELET
Basophils Absolute: 0.1 10*3/uL (ref 0.0–0.2)
Basos: 1 %
EOS (ABSOLUTE): 0.7 10*3/uL — ABNORMAL HIGH (ref 0.0–0.4)
Eos: 6 %
Hematocrit: 38.4 % (ref 34.0–46.6)
Hemoglobin: 12.7 g/dL (ref 11.1–15.9)
Immature Grans (Abs): 0 10*3/uL (ref 0.0–0.1)
Immature Granulocytes: 0 %
Lymphocytes Absolute: 2.5 10*3/uL (ref 0.7–3.1)
Lymphs: 24 %
MCH: 29.1 pg (ref 26.6–33.0)
MCHC: 33.1 g/dL (ref 31.5–35.7)
MCV: 88 fL (ref 79–97)
Monocytes Absolute: 0.8 10*3/uL (ref 0.1–0.9)
Monocytes: 7 %
Neutrophils Absolute: 6.3 10*3/uL (ref 1.4–7.0)
Neutrophils: 62 %
Platelets: 256 10*3/uL (ref 150–450)
RBC: 4.37 x10E6/uL (ref 3.77–5.28)
RDW: 14.1 % (ref 11.7–15.4)
WBC: 10.4 10*3/uL (ref 3.4–10.8)

## 2020-07-22 LAB — LIPID PANEL
Chol/HDL Ratio: 3.8 ratio (ref 0.0–4.4)
Cholesterol, Total: 161 mg/dL (ref 100–199)
HDL: 42 mg/dL (ref >39)
LDL Chol Calc (NIH): 101 mg/dL — ABNORMAL HIGH (ref 0–99)
Triglycerides: 100 mg/dL (ref 0–149)
VLDL Cholesterol Cal: 18 mg/dL (ref 5–40)

## 2020-07-22 NOTE — Telephone Encounter (Signed)
Call returned to patient  Voucher given for 109-month supply of trulicity Will have to completed patient assistance if patient tolerates medication

## 2020-07-23 ENCOUNTER — Other Ambulatory Visit: Payer: Self-pay | Admitting: Family

## 2020-07-26 ENCOUNTER — Other Ambulatory Visit: Payer: Self-pay

## 2020-07-26 DIAGNOSIS — E1159 Type 2 diabetes mellitus with other circulatory complications: Secondary | ICD-10-CM

## 2020-07-26 MED ORDER — ATENOLOL 50 MG PO TABS: 50.0000 mg | ORAL_TABLET | Freq: Two times a day (BID) | ORAL | 1 refills | Status: DC

## 2020-07-27 DIAGNOSIS — Z992 Dependence on renal dialysis: Secondary | ICD-10-CM | POA: Diagnosis not present

## 2020-08-03 DIAGNOSIS — Z992 Dependence on renal dialysis: Secondary | ICD-10-CM | POA: Diagnosis not present

## 2020-08-03 DIAGNOSIS — N186 End stage renal disease: Secondary | ICD-10-CM | POA: Diagnosis not present

## 2020-08-04 DIAGNOSIS — Z961 Presence of intraocular lens: Secondary | ICD-10-CM | POA: Diagnosis not present

## 2020-08-04 DIAGNOSIS — H04123 Dry eye syndrome of bilateral lacrimal glands: Secondary | ICD-10-CM | POA: Diagnosis not present

## 2020-08-04 DIAGNOSIS — H40013 Open angle with borderline findings, low risk, bilateral: Secondary | ICD-10-CM | POA: Diagnosis not present

## 2020-08-04 DIAGNOSIS — E119 Type 2 diabetes mellitus without complications: Secondary | ICD-10-CM | POA: Diagnosis not present

## 2020-08-04 LAB — HM DIABETES EYE EXAM

## 2020-08-05 DIAGNOSIS — Z992 Dependence on renal dialysis: Secondary | ICD-10-CM | POA: Diagnosis not present

## 2020-08-05 DIAGNOSIS — N186 End stage renal disease: Secondary | ICD-10-CM | POA: Diagnosis not present

## 2020-08-05 DIAGNOSIS — D631 Anemia in chronic kidney disease: Secondary | ICD-10-CM | POA: Diagnosis not present

## 2020-08-05 DIAGNOSIS — D509 Iron deficiency anemia, unspecified: Secondary | ICD-10-CM | POA: Diagnosis not present

## 2020-08-05 DIAGNOSIS — N2581 Secondary hyperparathyroidism of renal origin: Secondary | ICD-10-CM | POA: Diagnosis not present

## 2020-08-06 DIAGNOSIS — Z1231 Encounter for screening mammogram for malignant neoplasm of breast: Secondary | ICD-10-CM | POA: Diagnosis not present

## 2020-08-11 ENCOUNTER — Ambulatory Visit (INDEPENDENT_AMBULATORY_CARE_PROVIDER_SITE_OTHER): Payer: Medicare Other | Admitting: Pharmacist

## 2020-08-11 ENCOUNTER — Other Ambulatory Visit: Payer: Self-pay

## 2020-08-11 ENCOUNTER — Encounter: Payer: Self-pay | Admitting: Pharmacist

## 2020-08-11 DIAGNOSIS — I2510 Atherosclerotic heart disease of native coronary artery without angina pectoris: Secondary | ICD-10-CM

## 2020-08-11 DIAGNOSIS — E119 Type 2 diabetes mellitus without complications: Secondary | ICD-10-CM | POA: Diagnosis not present

## 2020-08-11 MED ORDER — ONDANSETRON HCL 4 MG PO TABS: 4.0000 mg | ORAL_TABLET | Freq: Three times a day (TID) | ORAL | 3 refills | Status: DC | PRN

## 2020-08-11 MED ORDER — TRULICITY 1.5 MG/0.5ML ~~LOC~~ SOAJ: 1.5000 mg | SUBCUTANEOUS | 1 refills | Status: DC

## 2020-08-11 NOTE — Progress Notes (Signed)
    08/11/2020 Name: [name] MRN: [id number] DOB: Apr 26, 1951   S:  69 yoF presents for diabetes evaluation, education, and management Patient was referred and last seen by Primary Care Provider on 07/21/20.  Patient has been on insulin monotherapy for diabetes.  She has been on dialysis for 5 years and appears stable  Insurance coverage/medication affordability: medicare (reports insulin is expensive)  Patient reports adherence with medications. . Current diabetes medications include: lantus . Current hypertension medications include: atenolol, hydralazine, nifedipine Goal 130/80 . Current hyperlipidemia medications include: pravastatin   Patient denies hypoglycemic events.   Patient reported dietary habits: Eats 2-3 meals/day Discussed meal planning options and Plate method for healthy eating . Avoid sugary drinks and desserts . Incorporate balanced protein, non starchy veggies, 1 serving of carbohydrate with each meal . Increase water intake (balance with HD, fluid) . Increase physical activity as able  Patient encouraged to follow her low phosphorous diet per nephro and continue taking phos binders.  Patient-reported exercise habits: n/a, HD TTS  O:  Lab Results  Component Value Date   HGBA1C 7.2 (H) 07/21/2020     Lipid Panel     Component Value Date/Time   CHOL 161 07/21/2020 1039   CHOL 241 (H) 03/25/2013 1026   TRIG 100 07/21/2020 1039   TRIG 147 10/09/2013 1527   TRIG 209 (H) 03/25/2013 1026   HDL 42 07/21/2020 1039   HDL 65 10/09/2013 1527   HDL 51 03/25/2013 1026   CHOLHDL 3.8 07/21/2020 1039   CHOLHDL 3.0 06/17/2008 0625   VLDL 19 06/17/2008 0625   LDLCALC 101 (H) 07/21/2020 1039   LDLCALC 125 (H) 10/09/2013 1527   LDLCALC 148 (H) 03/25/2013 1026    Home fasting blood sugars: 140-170s  2 hour post-meal/random blood sugars: ranges 140-200      A/P:  Diabetes T2DM, A1c currently 7.2%. Patient is able to verbalize appropriate hypoglycemia  management plan. Patient is adherent with medication.  Our goal is to decrease the amount of insulin the patient is on and optimize GLP1.  -DECREASE Lantus 20 units nightly   Application submitted for [facility] to obtain trulicity and basaglar  -Increased dose of GLP-1 TRULICITY (generic name DULAGLUTIDE) to 1.5MG  SQ WEEKLY.  PATIENT DENIES HISTORY OF [facility] CANCER  SAMPLE GIVEN--> QJF#[id number] K, EXP1/18/23  ZOFRAN CALLED IN FOR PATIENT'S NAUSEA  -Extensively discussed pathophysiology of diabetes, recommended lifestyle interventions, dietary effects on blood sugar control  -Counseled on s/sx of and management of hypoglycemia  -Next A1C anticipated 3-6 months  Written patient instructions provided.  Total time in face to face counseling 30 minutes.   Follow up PCP Clinic Visit ON 09/01/20.   Regina Eck, PharmD, BCPS Clinical Pharmacist, [place]  [facility] Phone [phone]

## 2020-08-25 ENCOUNTER — Other Ambulatory Visit: Payer: Self-pay | Admitting: *Deleted

## 2020-08-25 ENCOUNTER — Ambulatory Visit (HOSPITAL_COMMUNITY)
Admission: RE | Admit: 2020-08-25 | Discharge: 2020-08-25 | Disposition: A | Discharge status: Home / Self Care | Payer: Medicare Other | Source: Ambulatory Visit | Attending: Vascular Surgery | Admitting: Vascular Surgery

## 2020-08-25 ENCOUNTER — Ambulatory Visit (INDEPENDENT_AMBULATORY_CARE_PROVIDER_SITE_OTHER): Payer: Medicare Other | Admitting: Vascular Surgery

## 2020-08-25 ENCOUNTER — Encounter: Payer: Self-pay | Admitting: *Deleted

## 2020-08-25 ENCOUNTER — Other Ambulatory Visit: Payer: Self-pay

## 2020-08-25 ENCOUNTER — Encounter: Payer: Self-pay | Admitting: Vascular Surgery

## 2020-08-25 VITALS — BP 130/65 | HR 78 | Temp 98.1°F | Resp 20 | Ht 63.0 in | Wt 189.0 lb

## 2020-08-25 DIAGNOSIS — I2510 Atherosclerotic heart disease of native coronary artery without angina pectoris: Secondary | ICD-10-CM

## 2020-08-25 DIAGNOSIS — I739 Peripheral vascular disease, unspecified: Secondary | ICD-10-CM | POA: Diagnosis not present

## 2020-08-25 DIAGNOSIS — N186 End stage renal disease: Secondary | ICD-10-CM | POA: Diagnosis not present

## 2020-08-25 DIAGNOSIS — Z992 Dependence on renal dialysis: Secondary | ICD-10-CM | POA: Diagnosis not present

## 2020-08-25 NOTE — Progress Notes (Signed)
REASON FOR VISIT:   Follow-up of peripheral vascular disease  MEDICAL ISSUES:   PERIPHERAL VASCULAR DISEASE: This patient has worsening claudication symptoms of both lower extremities with evidence of multilevel arterial occlusive disease.  I cannot palpate femoral pulses and I suspect she has significant aortoiliac occlusive disease.  She feels like her symptoms are significantly disabling.  For this reason, I have recommended we proceed with CT angiogram to further evaluate her disease.  I will make further recommendations pending these results.  END-STAGE RENAL DISEASE: The patient has had increasing problems with bleeding from her right upper arm fistula.  The fistula is pulsatile suggesting an outflow obstruction. I have recommended we proceed with a fistulogram and possible angioplasty given the problems she is having with bleeding.  We will schedule this on 09/03/2020 which is a nondialysis day.  HPI:   [name] is a pleasant 69 y.o. female who I last saw on 12/31/2019.  I been following her with peripheral vascular disease.  She has evidence of multilevel arterial occlusive disease.  I felt that she likely had inflow disease on the right and infrainguinal arterial occlusive disease bilaterally.  Her symptoms were stable and we discussed the importance of walking and nutrition.  She comes in for 68-month follow-up visit.  Of note she quit smoking in 2003.  Since I saw her last, she states that her symptoms in both legs have worsened.  She experiences claudication in her hips thighs and calves bilaterally which is brought on by ambulation at a very short distance and relieved with rest.  Her symptoms are equal on both sides.  She denies any history of rest pain or nonhealing ulcers.    Risk factors for peripheral vascular disease include diabetes, hypertension, and hypercholesterolemia.  She denies any family history of premature cardiovascular disease.  She has a remote history of  tobacco use.  Of note, she has end-stage renal disease and dialyzes on Tuesdays Thursdays and Saturdays.  She tells me that they are having increasing problems with bleeding from her fistula after dialysis.  She did undergo a fistulogram in March of this year by Dr. Carlis Abbott.  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  There was a 70% stenosis of the cephalic vein at the arch near the subclavian vein junction.  This was ultimately crossed and angioplasty was performed with an 8 mm x 48 mm balloon and then a 9 mm x 80 mm balloon with no residual stenosis.  Past Medical History:  Diagnosis Date  . Acute on chronic diastolic CHF (congestive heart failure) ([place]) 03/05/2015   Grade 2. EF 60-65%  . Anemia    hx of  . Anxiety   . Arthritis   . CAD (coronary artery disease)    s/p Stenting in 2005;  [place] 7/09: Vigorous LV function, 2-3+ MR, RI 40%, RI stent patent, proximal-mid RCA 40-50%;  Echo 7/09:  EF 55-65%, trivial MR;  Myov[place] 11/18/12: EF 66%, normal LV wall motion, mild to moderate anterior ischemia - reviewed by [facility] and felt to rep breast atten (low risk);  Echo 6/14: mild LVH, EF 55-65%, Gr 2 DD, PASP 44, trivial eff   . Chronic kidney disease    CREATININE IS UP--GOING TO KIDNEY MD   . Chronic neck pain   . COPD (chronic obstructive pulmonary disease) ([place])   . Degenerative joint disease    left shoulder  . Diabetic  neuropathy ([place])   . Diverticulosis   . Esophageal stricture   . GERD (gastroesophageal reflux disease)   . Hyperlipidemia   . Hypertension   . IDDM (insulin dependent diabetes mellitus)   . Pericardial effusion 03/06/2015   Very small per echo ; pleural nodules seen on CT chest.  . Peripheral vascular disease ([place])   . Stroke [facility])    "mini- years ago"  . Vitamin D deficiency     Family History  Problem Relation Age of Onset  . Heart disease Other   . Hypertension Father   . Cancer Father   . Hypertension Mother    . Hypertension Daughter   . Pancreatitis Brother   . Hypertension Daughter   . Diabetes Daughter     SOCIAL HISTORY: Social History   Tobacco Use  . Smoking status: Former Smoker    Packs/day: 1.00    Years: 30.00    Pack years: 30.00    Quit date: 12/04/2001    Years since quitting: 18.7  . Smokeless tobacco: Never Used  Substance Use Topics  . Alcohol use: No    Alcohol/week: 6.0 standard drinks    Types: 6 Cans of beer per week    Allergies  Allergen Reactions  . Azithromycin     Prolonged QT  . Ace Inhibitors Cough  . Clopidogrel Bisulfate Other (See Comments)    Sick, Headache, "Felt terrible"  . Codeine Other (See Comments)    hallucinations  . Lisinopril Cough  . Penicillins Other (See Comments)    Did it involve swelling of the face/tongue/throat, SOB, or low BP? Unknown Did it involve sudden or severe rash/hives, skin peeling, or any reaction on the inside of your mouth or nose? Unknown Did you need to seek medical attention at a hospital or doctor's office? Unknown When did it last happen?childhood allergy If all above answers are "NO", may proceed with cephalosporin use.      Current Outpatient Medications  Medication Sig Dispense Refill  . acetaminophen (TYLENOL) 500 MG tablet Take 1,000 mg by mouth every 6 (six) hours as needed for moderate pain or headache.    . albuterol (PROVENTIL) (2.5 MG/3ML) 0.083% nebulizer solution Take 3 mLs (2.5 mg total) by nebulization every 6 (six) hours as needed for wheezing or shortness of breath. 75 mL 12  . albuterol (VENTOLIN HFA) 108 (90 Base) MCG/ACT inhaler Inhale 2 puffs into the lungs every 6 (six) hours as needed for wheezing or shortness of breath. 1 Inhaler 2  . aspirin 81 MG EC tablet Take 81 mg by mouth daily.      [name] atenolol (TENORMIN) 50 MG tablet Take 1 tablet (50 mg total) by mouth 2 (two) times daily. 180 tablet 1  . B Complex-C-Folic Acid (DIALYVITE 676) 0.8 MG TABS Take 1 tablet by mouth daily.     . busPIRone (BUSPAR) 7.5 MG tablet Take 1 tablet (7.5 mg total) by mouth 3 (three) times daily as needed. (Patient taking differently: Take 7.5 mg by mouth 3 (three) times daily as needed (anxiety). ) 90 tablet 1  . calcium acetate (PHOSLO) 667 MG capsule Take 667-1,334 mg by mouth See admin instructions. Take 1334 mg with meals 3 times daily and 667 mg with snacks    . cetirizine (ZYRTEC) 10 MG tablet Take 1 tablet (10 mg total) by mouth daily as needed (itch). 90 tablet 1  . Dulaglutide (TRULICITY) 1.5 HM/0.9OB SOPN Inject 0.5 mLs (1.5 mg total) into the skin once a week.  2 mL 1  . gabapentin (NEURONTIN) 300 MG capsule Take two after each dialysis 60 capsule 2  . insulin glargine (LANTUS SOLOSTAR) 100 UNIT/ML Solostar Pen INJECT  20 UNITS INTO THE SKIN DAILY AT 10 PM. 15 mL 4  . NIFEdipine (PROCARDIA XL/NIFEDICAL-XL) 90 MG 24 hr tablet     . nitroGLYCERIN (NITROSTAT) 0.4 MG SL tablet Place 1 tablet (0.4 mg total) under the tongue every 5 (five) minutes as needed for chest pain. 25 tablet 11  . ondansetron (ZOFRAN) 4 MG tablet Take 1 tablet (4 mg total) by mouth every 8 (eight) hours as needed for nausea or vomiting. 20 tablet 3  . pravastatin (PRAVACHOL) 20 MG tablet Take 1 tablet by mouth once daily 90 tablet 0  . torsemide (DEMADEX) 20 MG tablet Take 40 mg by mouth daily.     [name] triamcinolone cream (KENALOG) 0.1 % Apply 1 application topically 3 (three) times daily. Avoid face and genitalia 45 g 0  . budesonide-formoterol (SYMBICORT) 160-4.5 MCG/ACT inhaler Inhale 2 puffs into the lungs 2 (two) times daily as needed. (Patient taking differently: Inhale 2 puffs into the lungs 2 (two) times daily as needed (shortness of breath). ) 1 Inhaler 3  . guaiFENesin (MUCINEX) 600 MG 12 hr tablet Take 600 mg by mouth 2 (two) times daily as needed (congestion). (Patient not taking: Reported on 07/21/2020)    . hydrALAZINE (APRESOLINE) 50 MG tablet Take 1 tablet (50 mg total) by mouth 2 (two) times daily. 60  tablet 3   No current facility-administered medications for this visit.    REVIEW OF SYSTEMS:  [X]  denotes positive finding, [ ]  denotes negative finding Cardiac  Comments:  Chest pain or chest pressure:    Shortness of breath upon exertion:    Short of breath when lying flat:    Irregular heart rhythm:        Vascular    Pain in calf, thigh, or hip brought on by ambulation: x   Pain in feet at night that wakes you up from your sleep:     Blood clot in your veins:    Leg swelling:         Pulmonary    Oxygen at home:    Productive cough:     Wheezing:         Neurologic    Sudden weakness in arms or legs:     Sudden numbness in arms or legs:     Sudden onset of difficulty speaking or slurred speech:    Temporary loss of vision in one eye:     Problems with dizziness:         Gastrointestinal    Blood in stool:     Vomited blood:         Genitourinary    Burning when urinating:     Blood in urine:        Psychiatric    Major depression:         Hematologic    Bleeding problems:    Problems with blood clotting too easily:        Skin    Rashes or ulcers:        Constitutional    Fever or chills:     PHYSICAL EXAM:   Vitals:   08/25/20 1322  BP: 130/65  Pulse: 78  Resp: 20  Temp: 98.1 F (36.7 C)  SpO2: 97%  Weight: 189 lb (85.7 kg)  Height: 5\' 3"  (1.6 m)  GENERAL: The patient is a well-nourished female, in no acute distress. The vital signs are documented above. CARDIAC: There is a regular rate and rhythm.  VASCULAR: I do not detect carotid bruits. I cannot palpate femoral pulses. I cannot palpate pedal pulses. Her right upper arm fistula is pulsatile. PULMONARY: There is good air exchange bilaterally without wheezing or rales. ABDOMEN: Soft and non-tender with normal pitched bowel sounds.  MUSCULOSKELETAL: There are no major deformities or cyanosis. NEUROLOGIC: No focal weakness or paresthesias are detected. SKIN: There are no ulcers or  rashes noted. PSYCHIATRIC: The patient has a normal affect.  DATA:    ARTERIAL DOPPLER STUDY: I have independently interpreted her arterial Doppler study today.  On the right side there is a monophasic dorsalis pedis and posterior tibial signal.  ABI is 58%.  Toe pressure 66 mmHg.  On the left side, there is a monophasic dorsalis pedis and posterior tibial signal.  ABIs 57%.  Toe pressure is 70 mmHg.  Deitra Mayo [facility] [phone]

## 2020-08-30 ENCOUNTER — Encounter: Payer: Self-pay | Admitting: Nurse Practitioner

## 2020-08-30 ENCOUNTER — Telehealth: Payer: Self-pay | Admitting: Pharmacist

## 2020-08-30 ENCOUNTER — Other Ambulatory Visit: Payer: Self-pay

## 2020-08-30 ENCOUNTER — Ambulatory Visit (INDEPENDENT_AMBULATORY_CARE_PROVIDER_SITE_OTHER): Payer: Medicare Other | Admitting: Nurse Practitioner

## 2020-08-30 ENCOUNTER — Ambulatory Visit: Payer: Medicare Other | Admitting: *Deleted

## 2020-08-30 VITALS — BP 145/81 | HR 95 | Temp 98.2°F | Resp 20 | Ht 63.0 in | Wt 188.0 lb

## 2020-08-30 DIAGNOSIS — I509 Heart failure, unspecified: Secondary | ICD-10-CM | POA: Diagnosis not present

## 2020-08-30 DIAGNOSIS — I132 Hypertensive heart and chronic kidney disease with heart failure and with stage 5 chronic kidney disease, or end stage renal disease: Secondary | ICD-10-CM | POA: Diagnosis not present

## 2020-08-30 DIAGNOSIS — I5032 Chronic diastolic (congestive) heart failure: Secondary | ICD-10-CM

## 2020-08-30 DIAGNOSIS — E161 Other hypoglycemia: Secondary | ICD-10-CM | POA: Diagnosis not present

## 2020-08-30 DIAGNOSIS — R0602 Shortness of breath: Secondary | ICD-10-CM | POA: Diagnosis not present

## 2020-08-30 DIAGNOSIS — J441 Chronic obstructive pulmonary disease with (acute) exacerbation: Secondary | ICD-10-CM

## 2020-08-30 DIAGNOSIS — I700 Atherosclerosis of aorta: Secondary | ICD-10-CM | POA: Diagnosis not present

## 2020-08-30 DIAGNOSIS — Z992 Dependence on renal dialysis: Secondary | ICD-10-CM | POA: Diagnosis not present

## 2020-08-30 DIAGNOSIS — J449 Chronic obstructive pulmonary disease, unspecified: Secondary | ICD-10-CM

## 2020-08-30 DIAGNOSIS — I2510 Atherosclerotic heart disease of native coronary artery without angina pectoris: Secondary | ICD-10-CM | POA: Diagnosis not present

## 2020-08-30 DIAGNOSIS — R0689 Other abnormalities of breathing: Secondary | ICD-10-CM | POA: Diagnosis not present

## 2020-08-30 DIAGNOSIS — R069 Unspecified abnormalities of breathing: Secondary | ICD-10-CM | POA: Diagnosis not present

## 2020-08-30 DIAGNOSIS — I491 Atrial premature depolarization: Secondary | ICD-10-CM | POA: Diagnosis not present

## 2020-08-30 DIAGNOSIS — N186 End stage renal disease: Secondary | ICD-10-CM | POA: Diagnosis not present

## 2020-08-30 DIAGNOSIS — R0902 Hypoxemia: Secondary | ICD-10-CM | POA: Diagnosis not present

## 2020-08-30 DIAGNOSIS — Z20822 Contact with and (suspected) exposure to covid-19: Secondary | ICD-10-CM | POA: Diagnosis not present

## 2020-08-30 DIAGNOSIS — E162 Hypoglycemia, unspecified: Secondary | ICD-10-CM | POA: Diagnosis not present

## 2020-08-30 DIAGNOSIS — R062 Wheezing: Secondary | ICD-10-CM | POA: Diagnosis not present

## 2020-08-30 MED ORDER — ALBUTEROL SULFATE HFA 108 (90 BASE) MCG/ACT IN AERS: 2.0000 | INHALATION_SPRAY | Freq: Four times a day (QID) | RESPIRATORY_TRACT | 2 refills | Status: DC | PRN

## 2020-08-30 MED ORDER — ALBUTEROL SULFATE (2.5 MG/3ML) 0.083% IN NEBU: 2.5000 mg | INHALATION_SOLUTION | Freq: Four times a day (QID) | RESPIRATORY_TRACT | 12 refills | Status: DC | PRN

## 2020-08-30 MED ORDER — METHYLPREDNISOLONE ACETATE 80 MG/ML IJ SUSP
80.0000 mg | Freq: Once | INTRAMUSCULAR | Status: AC
  Administered 2020-08-30: 80 mg via INTRAMUSCULAR

## 2020-08-30 MED ORDER — PREDNISONE 20 MG PO TABS: ORAL_TABLET | ORAL | 0 refills | Status: DC

## 2020-08-30 NOTE — Telephone Encounter (Signed)
Trulicity sample given to patient RPZ#[id number] C, EXP 09/22/21 PHONE NUMBER GIVEN TO CALL TO SET UP SHIPMENT

## 2020-08-30 NOTE — Progress Notes (Signed)
   Subjective:    Patient ID: [name], female    DOB: 1951-02-16, 69 y.o.   MRN: [id number]   Chief Complaint: COPD flare up?   HPI Patient come sin today C/O Sob from COPD. She was having trouble breathing last night and called EMS. They gave her a breathing treatment and told her to follow up with her PCP today. She is slightly better from yesterday. Lots of wheezing. She is on symbicort and albuterol.   Review of Systems  Constitutional: Negative for diaphoresis.  Eyes: Negative for pain.  Respiratory: Positive for shortness of breath and wheezing. Negative for cough and choking.   Cardiovascular: Negative for chest pain, palpitations and leg swelling.  Gastrointestinal: Negative for abdominal pain.  Endocrine: Negative for polydipsia.  Skin: Negative for rash.  Neurological: Negative for dizziness, weakness and headaches.  Hematological: Does not bruise/bleed easily.  All other systems reviewed and are negative.      Objective:   Physical Exam Nursing note reviewed.  Constitutional:      Appearance: She is obese.  Cardiovascular:     Rate and Rhythm: Normal rate and regular rhythm.     Heart sounds: Normal heart sounds.  Pulmonary:     Effort: Pulmonary effort is normal. No respiratory distress.     Breath sounds: No stridor. Wheezing (exp throughout) present. No rhonchi.  Neurological:     Mental Status: She is alert.    BP (!) 145/81   Pulse 95   Temp 98.2 F (36.8 C) (Temporal)   Resp 20   Ht 5\' 3"  (1.6 m)   Wt 188 lb (85.3 kg)   SpO2 92%   BMI 33.30 kg/m        Assessment & Plan:  [name] in today with chief complaint of COPD flare up?   1. COPD exacerbation ([place]) Start oral steroids tomorrow Nebs as needed Watch blood sugars because they will go up with steroids - methylPREDNISolone acetate (DEPO-MEDROL) injection 80 mg - predniSONE (DELTASONE) 20 MG tablet; 2 po at sametime daily for 5 days- start tomorrow  Dispense: 10 tablet;  Refill: 0 - albuterol (VENTOLIN HFA) 108 (90 Base) MCG/ACT inhaler; Inhale 2 puffs into the lungs every 6 (six) hours as needed for wheezing or shortness of breath.  Dispense: 1 each; Refill: 2 - albuterol (PROVENTIL) (2.5 MG/3ML) 0.083% nebulizer solution; Take 3 mLs (2.5 mg total) by nebulization every 6 (six) hours as needed for wheezing or shortness of breath.  Dispense: 75 mL; Refill: 12     The above assessment and management plan was discussed with the patient. The patient verbalized understanding of and has agreed to the management plan. Patient is aware to call the clinic if symptoms persist or worsen. Patient is aware when to return to the clinic for a follow-up visit. Patient educated on when it is appropriate to go to the emergency department.   Mary-Margaret Hassell Done, FNP

## 2020-08-30 NOTE — Patient Instructions (Signed)
Visit Information  Goals Addressed              This Visit's Progress     Patient Stated   .  "I want my breathing to get better" (pt-stated)        CARE PLAN ENTRY (see longitudinal plan of care for additional care plan information)  Current Barriers:  . Chronic Disease Management support and education needs related to COPD . ESRD, HTN, CHF  Nurse Case Manager Clinical Goal(s):  [name] Over the next 7 days, patient will demonstrate a decrease in COPD exacerbations as evidenced by medication compliance and improved work of breathing.  . Over the next 10 days, patient will talk with RN Care Manager regarding COPD management  Interventions:  . Inter-disciplinary care team collaboration (see longitudinal plan of care) . Chart reviewed including recent office notes and lab results o Appt at PCP office today for COPD exacerbation o Called EMS for evaluation yesterday . Reviewed mediations and current treatments . Collaborated with care team members as needed . Encouraged to reach out to PCP office with any new or worsening symptoms or seek emergency medical care if needed . RN Care Manager will follow-up patient over the next 10 days regarding COPD exacerbation  Patient Self Care Activities:  . Performs ADL's independently . Performs IADL's independently  Initial goal documentation        Patient verbalizes understanding of instructions provided today.   Follow-up Plan The care management team will reach out to the patient again over the next 10 days.   Chong Sicilian, BSN, RN-BC Embedded Chronic Care Manager [facility] / [place] [facility] Direct Dial: [phone]

## 2020-08-30 NOTE — Patient Instructions (Signed)
Chronic Obstructive Pulmonary Disease Chronic obstructive pulmonary disease (COPD) is a long-term (chronic) lung problem. When you have COPD, it is hard for air to get in and out of your lungs. Usually the condition gets worse over time, and your lungs will never return to normal. There are things you can do to keep yourself as healthy as possible.  Your doctor may treat your condition with: ? Medicines. ? Oxygen. ? Lung surgery.  Your doctor may also recommend: ? Rehabilitation. This includes steps to make your body work better. It may involve a team of specialists. ? Quitting smoking, if you smoke. ? Exercise and changes to your diet. ? Comfort measures (palliative care). Follow these instructions at home: Medicines  Take over-the-counter and prescription medicines only as told by your doctor.  Talk to your doctor before taking any cough or allergy medicines. You may need to avoid medicines that cause your lungs to be dry. Lifestyle  If you smoke, stop. Smoking makes the problem worse. If you need help quitting, ask your doctor.  Avoid being around things that make your breathing worse. This may include smoke, chemicals, and fumes.  Stay active, but remember to rest as well.  Learn and use tips on how to relax.  Make sure you get enough sleep. Most adults need at least 7 hours of sleep every night.  Eat healthy foods. Eat smaller meals more often. Rest before meals. Controlled breathing Learn and use tips on how to control your breathing as told by your doctor. Try:  Breathing in (inhaling) through your nose for 1 second. Then, pucker your lips and breath out (exhale) through your lips for 2 seconds.  Putting one hand on your belly (abdomen). Breathe in slowly through your nose for 1 second. Your hand on your belly should move out. Pucker your lips and breathe out slowly through your lips. Your hand on your belly should move in as you breathe out.  Controlled coughing Learn  and use controlled coughing to clear mucus from your lungs. Follow these steps: 1. Lean your head a little forward. 2. Breathe in deeply. 3. Try to hold your breath for 3 seconds. 4. Keep your mouth slightly open while coughing 2 times. 5. Spit any mucus out into a tissue. 6. Rest and do the steps again 1 or 2 times as needed. General instructions  Make sure you get all the shots (vaccines) that your doctor recommends. Ask your doctor about a flu shot and a pneumonia shot.  Use oxygen therapy and pulmonary rehabilitation if told by your doctor. If you need home oxygen therapy, ask your doctor if you should buy a tool to measure your oxygen level (oximeter).  Make a COPD action plan with your doctor. This helps you to know what to do if you feel worse than usual.  Manage any other conditions you have as told by your doctor.  Avoid going outside when it is very hot, cold, or humid.  Avoid people who have a sickness you can catch (contagious).  Keep all follow-up visits as told by your doctor. This is important. Contact a doctor if:  You cough up more mucus than usual.  There is a change in the color or thickness of the mucus.  It is harder to breathe than usual.  Your breathing is faster than usual.  You have trouble sleeping.  You need to use your medicines more often than usual.  You have trouble doing your normal activities such as getting dressed   or walking around the house. Get help right away if:  You have shortness of breath while resting.  You have shortness of breath that stops you from: ? Being able to talk. ? Doing normal activities.  Your chest hurts for longer than 5 minutes.  Your skin color is more blue than usual.  Your pulse oximeter shows that you have low oxygen for longer than 5 minutes.  You have a fever.  You feel too tired to breathe normally. Summary  Chronic obstructive pulmonary disease (COPD) is a long-term lung problem.  The way your  lungs work will never return to normal. Usually the condition gets worse over time. There are things you can do to keep yourself as healthy as possible.  Take over-the-counter and prescription medicines only as told by your doctor.  If you smoke, stop. Smoking makes the problem worse. This information is not intended to replace advice given to you by your health care provider. Make sure you discuss any questions you have with your health care provider. Document Revised: 11/02/2017 Document Reviewed: 12/25/2016 Elsevier Patient Education  2020 Elsevier Inc.  

## 2020-08-30 NOTE — Chronic Care Management (AMB) (Signed)
Chronic Care Management   Follow Up Note   08/30/2020 Name: [name] MRN: [id number] DOB: 24-Aug-1951  Referred by: Tamara Fraise, MD Reason for referral : Chronic Care Management (Care Coordination)   [name] is a 69 y.o. year old female who is a primary care patient of [name], [name], MD. The CCM team was consulted for assistance with chronic disease management and care coordination needs.    Review of patient status, including review of consultants reports, relevant laboratory and other test results, and collaboration with appropriate care team members and the patient's provider was performed as part of comprehensive patient evaluation and provision of chronic care management services.    SDOH (Social Determinants of Health) assessments performed: No See Care Plan activities for detailed interventions related to [facility])     Outpatient Encounter Medications as of 08/30/2020  Medication Sig  . acetaminophen (TYLENOL) 500 MG tablet Take 1,000 mg by mouth every 6 (six) hours as needed for moderate pain or headache.  . albuterol (PROVENTIL) (2.5 MG/3ML) 0.083% nebulizer solution Take 3 mLs (2.5 mg total) by nebulization every 6 (six) hours as needed for wheezing or shortness of breath.  [name] albuterol (VENTOLIN HFA) 108 (90 Base) MCG/ACT inhaler Inhale 2 puffs into the lungs every 6 (six) hours as needed for wheezing or shortness of breath.  [name] aspirin 81 MG EC tablet Take 81 mg by mouth daily.    [name] atenolol (TENORMIN) 50 MG tablet Take 1 tablet (50 mg total) by mouth 2 (two) times daily.  . B Complex-C-Folic Acid (DIALYVITE 408) 0.8 MG TABS Take 1 tablet by mouth daily.  . budesonide-formoterol (SYMBICORT) 160-4.5 MCG/ACT inhaler Inhale 2 puffs into the lungs 2 (two) times daily as needed. (Patient taking differently: Inhale 2 puffs into the lungs 2 (two) times daily as needed (shortness of breath). )  . busPIRone (BUSPAR) 7.5 MG tablet Take 1 tablet (7.5 mg total) by mouth 3  (three) times daily as needed. (Patient taking differently: Take 7.5 mg by mouth 3 (three) times daily as needed (anxiety). )  . calcium acetate (PHOSLO) 667 MG capsule Take 667-1,334 mg by mouth See admin instructions. Take 1334 mg with meals 3 times daily and 667 mg with snacks  . Carboxymethylcellul-Glycerin (LUBRICATING EYE DROPS OP) Place 1 drop into both eyes daily as needed (dry eyes).  . cetirizine (ZYRTEC) 10 MG tablet Take 1 tablet (10 mg total) by mouth daily as needed (itch). (Patient taking differently: Take 10 mg by mouth daily as needed for allergies. )  . Dulaglutide (TRULICITY) 1.5 XK/4.8JE SOPN Inject 0.5 mLs (1.5 mg total) into the skin once a week. (Patient taking differently: Inject 1.5 mg into the skin every Friday. )  . gabapentin (NEURONTIN) 300 MG capsule Take two after each dialysis (Patient taking differently: Take 600 mg by mouth See admin instructions. Take 600 mg after dialysis on Tues, Thurs, and Sat)  . hydrALAZINE (APRESOLINE) 50 MG tablet Take 1 tablet (50 mg total) by mouth 2 (two) times daily.  . insulin glargine (LANTUS SOLOSTAR) 100 UNIT/ML Solostar Pen INJECT  20 UNITS INTO THE SKIN DAILY AT 10 PM. (Patient taking differently: Inject 20 Units into the skin at bedtime. )  . NIFEdipine (PROCARDIA XL/NIFEDICAL-XL) 90 MG 24 hr tablet Take 90 mg by mouth daily.   . nitroGLYCERIN (NITROSTAT) 0.4 MG SL tablet Place 1 tablet (0.4 mg total) under the tongue every 5 (five) minutes as needed for chest pain.  [name] ondansetron (ZOFRAN) 4 MG  tablet Take 1 tablet (4 mg total) by mouth every 8 (eight) hours as needed for nausea or vomiting.  . pravastatin (PRAVACHOL) 20 MG tablet Take 1 tablet by mouth once daily (Patient taking differently: Take 20 mg by mouth daily. )  . predniSONE (DELTASONE) 20 MG tablet 2 po at sametime daily for 5 days- start tomorrow  . torsemide (DEMADEX) 20 MG tablet Take 40 mg by mouth daily.   [name] triamcinolone cream (KENALOG) 0.1 % Apply 1 application  topically 3 (three) times daily. Avoid face and genitalia (Patient taking differently: Apply 1 application topically 3 (three) times daily as needed (rash). Avoid face and genitalia)  . [EXPIRED] methylPREDNISolone acetate (DEPO-MEDROL) injection 80 mg    No facility-administered encounter medications on file as of 08/30/2020.      RN Care Plan: Goals Addressed              This Visit's Progress     Patient Stated   .  "I want my breathing to get better" (pt-stated)        CARE PLAN ENTRY (see longitudinal plan of care for additional care plan information)  Current Barriers:   Chronic Disease Management support and education needs related to COPD  ESRD, HTN, CHF  Nurse Case Manager Clinical Goal(s):  [name] Over the next 7 days, patient will demonstrate a decrease in COPD exacerbations as evidenced by medication compliance and improved work of breathing.  . Over the next 10 days, patient will talk with RN Care Manager regarding COPD management  Interventions:  . Inter-disciplinary care team collaboration (see longitudinal plan of care) . Chart reviewed including recent office notes and lab results o Appt at PCP office today for COPD exacerbation o Called EMS for evaluation yesterday . Reviewed mediations and current treatments . Collaborated with care team members as needed . Encouraged to reach out to PCP office with any new or worsening symptoms or seek emergency medical care if needed . RN Care Manager will follow-up patient over the next 10 days regarding COPD exacerbation  Patient Self Care Activities:  . Performs ADL's independently . Performs IADL's independently  Initial goal documentation         Plan:   The care management team will reach out to the patient again over the next 10 days.    [name], BSN, RN-BC Embedded Chronic Care Manager [facility] / [place] [facility] Direct Dial: [phone]

## 2020-08-31 DIAGNOSIS — N2581 Secondary hyperparathyroidism of renal origin: Secondary | ICD-10-CM | POA: Diagnosis not present

## 2020-08-31 DIAGNOSIS — I2510 Atherosclerotic heart disease of native coronary artery without angina pectoris: Secondary | ICD-10-CM | POA: Diagnosis not present

## 2020-08-31 DIAGNOSIS — Z880 Allergy status to penicillin: Secondary | ICD-10-CM | POA: Diagnosis not present

## 2020-08-31 DIAGNOSIS — E1122 Type 2 diabetes mellitus with diabetic chronic kidney disease: Secondary | ICD-10-CM | POA: Diagnosis not present

## 2020-08-31 DIAGNOSIS — E8770 Fluid overload, unspecified: Secondary | ICD-10-CM | POA: Diagnosis not present

## 2020-08-31 DIAGNOSIS — Z6832 Body mass index (BMI) 32.0-32.9, adult: Secondary | ICD-10-CM | POA: Diagnosis not present

## 2020-08-31 DIAGNOSIS — J206 Acute bronchitis due to rhinovirus: Secondary | ICD-10-CM | POA: Diagnosis not present

## 2020-08-31 DIAGNOSIS — T82858A Stenosis of vascular prosthetic devices, implants and grafts, initial encounter: Secondary | ICD-10-CM | POA: Diagnosis not present

## 2020-08-31 DIAGNOSIS — Z87891 Personal history of nicotine dependence: Secondary | ICD-10-CM | POA: Diagnosis not present

## 2020-08-31 DIAGNOSIS — B9789 Other viral agents as the cause of diseases classified elsewhere: Secondary | ICD-10-CM | POA: Diagnosis not present

## 2020-08-31 DIAGNOSIS — D631 Anemia in chronic kidney disease: Secondary | ICD-10-CM | POA: Diagnosis not present

## 2020-08-31 DIAGNOSIS — N186 End stage renal disease: Secondary | ICD-10-CM | POA: Diagnosis not present

## 2020-08-31 DIAGNOSIS — Z20822 Contact with and (suspected) exposure to covid-19: Secondary | ICD-10-CM | POA: Diagnosis not present

## 2020-08-31 DIAGNOSIS — R918 Other nonspecific abnormal finding of lung field: Secondary | ICD-10-CM | POA: Diagnosis not present

## 2020-08-31 DIAGNOSIS — T8249XA Other complication of vascular dialysis catheter, initial encounter: Secondary | ICD-10-CM | POA: Diagnosis not present

## 2020-08-31 DIAGNOSIS — R0602 Shortness of breath: Secondary | ICD-10-CM | POA: Diagnosis not present

## 2020-08-31 DIAGNOSIS — J441 Chronic obstructive pulmonary disease with (acute) exacerbation: Secondary | ICD-10-CM | POA: Diagnosis not present

## 2020-08-31 DIAGNOSIS — T82898A Other specified complication of vascular prosthetic devices, implants and grafts, initial encounter: Secondary | ICD-10-CM | POA: Diagnosis not present

## 2020-08-31 DIAGNOSIS — Z794 Long term (current) use of insulin: Secondary | ICD-10-CM | POA: Diagnosis not present

## 2020-08-31 DIAGNOSIS — I132 Hypertensive heart and chronic kidney disease with heart failure and with stage 5 chronic kidney disease, or end stage renal disease: Secondary | ICD-10-CM | POA: Diagnosis not present

## 2020-08-31 DIAGNOSIS — I120 Hypertensive chronic kidney disease with stage 5 chronic kidney disease or end stage renal disease: Secondary | ICD-10-CM | POA: Diagnosis not present

## 2020-08-31 DIAGNOSIS — J810 Acute pulmonary edema: Secondary | ICD-10-CM | POA: Diagnosis not present

## 2020-08-31 DIAGNOSIS — Z992 Dependence on renal dialysis: Secondary | ICD-10-CM | POA: Diagnosis not present

## 2020-08-31 DIAGNOSIS — I509 Heart failure, unspecified: Secondary | ICD-10-CM | POA: Diagnosis not present

## 2020-08-31 DIAGNOSIS — B348 Other viral infections of unspecified site: Secondary | ICD-10-CM | POA: Diagnosis not present

## 2020-08-31 DIAGNOSIS — J811 Chronic pulmonary edema: Secondary | ICD-10-CM | POA: Diagnosis not present

## 2020-09-01 ENCOUNTER — Ambulatory Visit: Payer: Medicare Other | Admitting: Family Medicine

## 2020-09-02 ENCOUNTER — Inpatient Hospital Stay (HOSPITAL_COMMUNITY): Admission: RE | Admit: 2020-09-02 | Payer: Medicare Other | Source: Ambulatory Visit

## 2020-09-02 DIAGNOSIS — N186 End stage renal disease: Secondary | ICD-10-CM | POA: Diagnosis not present

## 2020-09-02 DIAGNOSIS — Z992 Dependence on renal dialysis: Secondary | ICD-10-CM | POA: Diagnosis not present

## 2020-09-03 ENCOUNTER — Encounter (HOSPITAL_COMMUNITY): Admission: RE | Payer: Self-pay | Source: Home / Self Care

## 2020-09-03 ENCOUNTER — Ambulatory Visit (HOSPITAL_COMMUNITY): Admission: RE | Admit: 2020-09-03 | Payer: Medicare Other | Source: Home / Self Care | Admitting: Vascular Surgery

## 2020-09-03 SURGERY — A/V FISTULAGRAM
Anesthesia: LOCAL

## 2020-09-04 DIAGNOSIS — Z23 Encounter for immunization: Secondary | ICD-10-CM | POA: Diagnosis not present

## 2020-09-04 DIAGNOSIS — N2581 Secondary hyperparathyroidism of renal origin: Secondary | ICD-10-CM | POA: Diagnosis not present

## 2020-09-04 DIAGNOSIS — D631 Anemia in chronic kidney disease: Secondary | ICD-10-CM | POA: Diagnosis not present

## 2020-09-04 DIAGNOSIS — D509 Iron deficiency anemia, unspecified: Secondary | ICD-10-CM | POA: Diagnosis not present

## 2020-09-04 DIAGNOSIS — Z992 Dependence on renal dialysis: Secondary | ICD-10-CM | POA: Diagnosis not present

## 2020-09-04 DIAGNOSIS — N186 End stage renal disease: Secondary | ICD-10-CM | POA: Diagnosis not present

## 2020-09-08 ENCOUNTER — Ambulatory Visit (INDEPENDENT_AMBULATORY_CARE_PROVIDER_SITE_OTHER): Payer: Medicare Other | Admitting: *Deleted

## 2020-09-08 ENCOUNTER — Other Ambulatory Visit: Payer: Self-pay | Admitting: Family Medicine

## 2020-09-08 ENCOUNTER — Telehealth: Payer: Self-pay | Admitting: *Deleted

## 2020-09-08 DIAGNOSIS — N186 End stage renal disease: Secondary | ICD-10-CM

## 2020-09-08 DIAGNOSIS — J439 Emphysema, unspecified: Secondary | ICD-10-CM

## 2020-09-08 DIAGNOSIS — J441 Chronic obstructive pulmonary disease with (acute) exacerbation: Secondary | ICD-10-CM

## 2020-09-08 DIAGNOSIS — I5032 Chronic diastolic (congestive) heart failure: Secondary | ICD-10-CM

## 2020-09-08 NOTE — Telephone Encounter (Addendum)
09/08/2020  I reached out to [name] today for a CCM follow-up. She was admitted to [facility] on 08/30/20 and discharged on 09/03/20. She has a hospital f/u with Dr Livia Snellen scheduled for 09/15/20. She is a dialysis patient with COPD. Per [facility], diagnosis of COPD exacerbation, CHF, pulmonary edema, and fluid volume overload.   She does still complain of chest congestion that she's having difficulty coughing up. Negative Covid test while inpatient. She purchased Mucinex DM but that isn't helping.   I recommended plain mucinex without the DM so as to thin the mucous but not suppress the cough. She is on fluid restrictions due to dialysis but I encouraged to drink as much water as is allowed to help thin the mucous and to also avoid dairy.   She will need a pulmonary referral per discharge instructions. I thought it would be best to go ahead and get started on that now instead of waiting for hospital visit with Dr Livia Snellen. She would also like to be seen by Dr Livia Snellen sooner than the 13th if possible because of her ongoing SOB and chest congestion.   Reason for Referral: COPD Referral discussed with patient: yes Best contact number of patient for referral team: [phone]   Has patient been seen by a specialist for this issue before: no Patient provider preference for referral: [facility] Patient location preference for referral: [place] to [facility] Clinical staff for review and action and to PCP as FYI. Patient is available this afternoon and tomorrow afternoon for a telephone call.   Chong Sicilian, BSN, RN-BC Embedded Chronic Care Manager [facility] / [place] [facility] Direct Dial: [phone]

## 2020-09-08 NOTE — Patient Instructions (Signed)
Visit Information  Goals Addressed              This Visit's Progress     Patient Stated   .  "I want my breathing to get better" (pt-stated)   Not on track     [name] (see longitudinal plan of care for additional care plan information)  Current Barriers:  . Chronic Disease Management support and education needs related to COPD . ESRD, HTN, CHF  Nurse Case Manager Clinical Goal(s):  [name] Over the next 7 days, patient will have hospital follow-up visit with PCP . Over the next 15 days, patient will contact PCP or emergency services with any new or worsening symptoms . Over the next 15 days, patient will talk with RN Care Manager regarding COPD management and current exacerbation  Interventions:  . Inter-disciplinary care team collaboration (see longitudinal plan of care) . Chart reviewed including office notes, hospital notes, discharge notes, and lab results . Discussed HPI o Hospitalized on 08/30/20 at [facility] with COPD exacerbation, fluid volume overload, and CHF o Discharged on 09/03/20 o Continues to have some SOB along chest congestion and difficulty coughing anything up . Reviewed and discussed medications o Taking mucinex DM . Recommended plain mucinex without cough suppressant to encourage thinning of mucous and aid in expectorating it . Discussed fluid restrictions due to dialysis but encouraged to drink as much water as allowed in order to help thin mucous. Also encouraged to avoid dairy.  . Reviewed upcoming appt with Dr Livia Snellen on 09/15/20 . Discussed that pulmonary referral was recommended at discharge o Patient requesting [facility] in [place] . Sent message to Dr Livia Snellen and clinical staff requesting sooner appt if possible due to patient's continued symptoms of SOB and chest congestion that she has been unable to expectorate . Also requested in message that referral to [facility] be placed so that they can start working on scheduling  her  . Encouraged patient to reach out to PCP or emergency medical personnel with any new or worsening symptoms . Reach out to RN care manager as needed  Patient Self Care Activities:  . Performs ADL's independently . Performs IADL's independently  Please see past updates related to this goal by clicking on the "Past Updates" button in the selected goal         Patient verbalizes understanding of instructions provided today.   Follow-up Plan The care management team will reach out to the patient again over the next 10 days.  Next PCP appointment scheduled for: 09/15/20 with Dr Livia Snellen Sent message to [facility] clinical staff to see if appt can be moved up   Chong Sicilian, BSN, RN-BC [place] [facility] Direct Dial: [phone]

## 2020-09-08 NOTE — Chronic Care Management (AMB) (Signed)
Chronic Care Management   Follow Up Note   09/08/2020 Name: [name] MRN: [id number] DOB: [birth year]  Referred by: [name], MD Reason for referral : Chronic Care Management (RN follow-up)   [name] is a 69 y.o. year old female who is a primary care patient of [name], [name], MD. The CCM team was consulted for assistance with chronic disease management and care coordination needs.    Review of patient status, including review of consultants reports, relevant laboratory and other test results, and collaboration with appropriate care team members and the patient's provider was performed as part of comprehensive patient evaluation and provision of chronic care management services.    SDOH (Social Determinants of Health) assessments performed: No See Care Plan activities for detailed interventions related to [facility])     Outpatient Encounter Medications as of 09/08/2020  Medication Sig  . acetaminophen (TYLENOL) 500 MG tablet Take 1,000 mg by mouth every 6 (six) hours as needed for moderate pain or headache.  . albuterol (PROVENTIL) (2.5 MG/3ML) 0.083% nebulizer solution Take 3 mLs (2.5 mg total) by nebulization every 6 (six) hours as needed for wheezing or shortness of breath.  [name] albuterol (VENTOLIN HFA) 108 (90 Base) MCG/ACT inhaler Inhale 2 puffs into the lungs every 6 (six) hours as needed for wheezing or shortness of breath.  [name] aspirin 81 MG EC tablet Take 81 mg by mouth daily.    [name] atenolol (TENORMIN) 50 MG tablet Take 1 tablet (50 mg total) by mouth 2 (two) times daily.  . B Complex-C-Folic Acid (DIALYVITE 614) 0.8 MG TABS Take 1 tablet by mouth daily.  . budesonide-formoterol (SYMBICORT) 160-4.5 MCG/ACT inhaler Inhale 2 puffs into the lungs 2 (two) times daily as needed. (Patient taking differently: Inhale 2 puffs into the lungs 2 (two) times daily as needed (shortness of breath). )  . busPIRone (BUSPAR) 7.5 MG tablet Take 1 tablet (7.5 mg total) by mouth 3 (three)  times daily as needed. (Patient taking differently: Take 7.5 mg by mouth 3 (three) times daily as needed (anxiety). )  . calcium acetate (PHOSLO) 667 MG capsule Take 667-1,334 mg by mouth See admin instructions. Take 1334 mg with meals 3 times daily and 667 mg with snacks  . Carboxymethylcellul-Glycerin (LUBRICATING EYE DROPS OP) Place 1 drop into both eyes daily as needed (dry eyes).  . cetirizine (ZYRTEC) 10 MG tablet Take 1 tablet (10 mg total) by mouth daily as needed (itch). (Patient taking differently: Take 10 mg by mouth daily as needed for allergies. )  . Dulaglutide (TRULICITY) 1.5 ER/1.5QM SOPN Inject 0.5 mLs (1.5 mg total) into the skin once a week. (Patient taking differently: Inject 1.5 mg into the skin every Friday. )  . gabapentin (NEURONTIN) 300 MG capsule Take two after each dialysis (Patient taking differently: Take 600 mg by mouth See admin instructions. Take 600 mg after dialysis on Tues, Thurs, and Sat)  . hydrALAZINE (APRESOLINE) 50 MG tablet Take 1 tablet (50 mg total) by mouth 2 (two) times daily.  . insulin glargine (LANTUS SOLOSTAR) 100 UNIT/ML Solostar Pen INJECT  20 UNITS INTO THE SKIN DAILY AT 10 PM. (Patient taking differently: Inject 20 Units into the skin at bedtime. )  . NIFEdipine (PROCARDIA XL/NIFEDICAL-XL) 90 MG 24 hr tablet Take 90 mg by mouth daily.   . nitroGLYCERIN (NITROSTAT) 0.4 MG SL tablet Place 1 tablet (0.4 mg total) under the tongue every 5 (five) minutes as needed for chest pain.  [name] ondansetron (ZOFRAN) 4 MG  tablet Take 1 tablet (4 mg total) by mouth every 8 (eight) hours as needed for nausea or vomiting.  . pravastatin (PRAVACHOL) 20 MG tablet Take 1 tablet by mouth once daily (Patient taking differently: Take 20 mg by mouth daily. )  . predniSONE (DELTASONE) 20 MG tablet 2 po at sametime daily for 5 days- start tomorrow  . torsemide (DEMADEX) 20 MG tablet Take 40 mg by mouth daily.   [name] triamcinolone cream (KENALOG) 0.1 % Apply 1 application topically 3  (three) times daily. Avoid face and genitalia (Patient taking differently: Apply 1 application topically 3 (three) times daily as needed (rash). Avoid face and genitalia)   No facility-administered encounter medications on file as of 09/08/2020.    Goals Addressed              This Visit's Progress     Patient Stated   .  "I want my breathing to get better" (pt-stated)   Not on track     [place] (see longitudinal plan of care for additional care plan information)  Current Barriers:  . Chronic Disease Management support and education needs related to COPD . ESRD, HTN, CHF  Nurse Case Manager Clinical Goal(s):  [name] Over the next 7 days, patient will have hospital follow-up visit with PCP . Over the next 15 days, patient will contact PCP or emergency services with any new or worsening symptoms . Over the next 15 days, patient will talk with RN Care Manager regarding COPD management and current exacerbation  Interventions:  . Inter-disciplinary care team collaboration (see longitudinal plan of care) . Chart reviewed including office notes, hospital notes, discharge notes, and lab results . Discussed HPI o Hospitalized on 08/30/20 at [facility] with COPD exacerbation, fluid volume overload, and CHF o Discharged on 09/03/20 o Continues to have some SOB along chest congestion and difficulty coughing anything up . Reviewed and discussed medications o Taking mucinex DM . Recommended plain mucinex without cough suppressant to encourage thinning of mucous and aid in expectorating it . Discussed fluid restrictions due to dialysis but encouraged to drink as much water as allowed in order to help thin mucous. Also encouraged to avoid dairy.  . Reviewed upcoming appt with Dr Livia Snellen on 09/15/20 . Discussed that pulmonary referral was recommended at discharge o Patient requesting [facility] in [place] . Sent message to Dr Livia Snellen and clinical staff requesting sooner appt if  possible due to patient's continued symptoms of SOB and chest congestion that she has been unable to expectorate . Also requested in message that referral to [facility] be placed so that they can start working on scheduling her  . Encouraged patient to reach out to PCP or emergency medical personnel with any new or worsening symptoms . Reach out to RN care manager as needed  Patient Self Care Activities:  . Performs ADL's independently . Performs IADL's independently  Please see past updates related to this goal by clicking on the "Past Updates" button in the selected goal            Plan:   The care management team will reach out to the patient again over the next 10 days.  Next PCP appointment scheduled for: 09/15/20 with Dr Livia Snellen Sent message to [facility] clinical staff to see if appt can be moved up   Chong Sicilian, BSN, RN-BC [place] / [place] Management Direct Dial: [phone]

## 2020-09-10 ENCOUNTER — Telehealth: Payer: Self-pay | Admitting: *Deleted

## 2020-09-10 ENCOUNTER — Telehealth: Payer: Medicare Other | Admitting: *Deleted

## 2020-09-10 NOTE — Telephone Encounter (Signed)
  Chronic Care Management   Follow-up Outreach 09/10/2020 Name: [name] MRN: [id number] DOB: October 08, 1951  Referred by: [name], MD Reason for referral : Chronic Care Management (RN Follow up)   An unsuccessful telephone follow-up was attempted today. The patient was referred to the case management team for assistance with care management and care coordination. Chart reviewed and referral to [facility] has been ordered. Appt has not been scheduled. Patient does have a follow-up appt scheduled with Dr Livia Snellen on 09/15/20.   Follow Up Plan: A HIPAA compliant phone message was left for the patient providing contact information and requesting a return call.  The care management team will reach out to the patient again over the next 7 days.   Chong Sicilian, BSN, RN-BC Embedded Chronic Care Manager [facility] / [place] Management Direct Dial: [phone]

## 2020-09-13 ENCOUNTER — Telehealth: Payer: Self-pay

## 2020-09-13 ENCOUNTER — Ambulatory Visit: Payer: Medicare Other | Admitting: *Deleted

## 2020-09-13 DIAGNOSIS — J441 Chronic obstructive pulmonary disease with (acute) exacerbation: Secondary | ICD-10-CM

## 2020-09-13 MED ORDER — ONETOUCH ULTRA VI STRP: ORAL_STRIP | 12 refills | Status: AC

## 2020-09-13 NOTE — Chronic Care Management (AMB) (Signed)
  Chronic Care Management   Care Coordination Note   09/13/2020 Name: [name] MRN: [id number] DOB: 1951/06/27  Referred by: [name], MD Reason for referral : Chronic Care Management (Care coordination)   [name] is a 69 y.o. year old female who is a primary care patient of [name], [name], MD. The CCM team was consulted for assistance with chronic disease management and care coordination needs.    Review of patient status, including review of consultants reports, relevant laboratory and other test results, and collaboration with appropriate care team members and the patient's provider was performed as part of comprehensive patient evaluation and provision of chronic care management services.    Goals Addressed              This Visit's Progress     Patient Stated   .  "I want my breathing to get better" (pt-stated)        CARE PLAN ENTRY (see longitudinal plan of care for additional care plan information)  Current Barriers:  . Chronic Disease Management support and education needs related to COPD . ESRD, HTN, CHF  Nurse Case Manager Clinical Goal(s):  [name] Over the next 7 days, patient will have hospital follow-up visit with PCP . Over the next 15 days, patient will contact PCP or emergency services with any new or worsening symptoms . Over the next 15 days, patient will talk with RN Care Manager regarding COPD management and current exacerbation  Interventions:  . Inter-disciplinary care team collaboration (see longitudinal plan of care) . Chart reviewed including office notes, hospital notes, discharge notes, and lab results . Noted upcoming appt with Dr Livia Snellen and Lottie Dawson, PharmD on 09/15/20 . Reviewed referral to [facility] o No updates o Consulted with [facility] referral coordinator regarding appt scheduling . Collaborated with Lottie Dawson, PharmD . Will follow-up with patient over the next 7 days  Patient Self Care Activities:   . Performs ADL's independently . Performs IADL's independently  Please see past updates related to this goal by clicking on the "Past Updates" button in the selected goal          Plan:   The care management team will reach out to the patient again over the next 7 days.    Chong Sicilian, BSN, RN-BC Embedded Chronic Care Manager [facility] / [place] Management Direct Dial: [phone]

## 2020-09-13 NOTE — Telephone Encounter (Signed)
Patient to see PharmD on 09/15/20 to discuss nausea for GLP1

## 2020-09-13 NOTE — Patient Instructions (Signed)
Visit Information  Goals Addressed              This Visit's Progress     Patient Stated   .  "I want my breathing to get better" (pt-stated)        CARE PLAN ENTRY (see longitudinal plan of care for additional care plan information)  Current Barriers:  . Chronic Disease Management support and education needs related to COPD . ESRD, HTN, CHF  Nurse Case Manager Clinical Goal(s):  [name] Over the next 7 days, patient will have hospital follow-up visit with PCP . Over the next 15 days, patient will contact PCP or emergency services with any new or worsening symptoms . Over the next 15 days, patient will talk with RN Care Manager regarding COPD management and current exacerbation  Interventions:  . Inter-disciplinary care team collaboration (see longitudinal plan of care) . Chart reviewed including office notes, hospital notes, discharge notes, and lab results . Noted upcoming appt with Dr Livia Snellen and Lottie Dawson, PharmD on 09/15/20 . Reviewed referral to [facility] o No updates o Consulted with [facility] referral coordinator regarding appt scheduling . Collaborated with Lottie Dawson, PharmD . Will follow-up with patient over the next 7 days  Patient Self Care Activities:  . Performs ADL's independently . Performs IADL's independently  Please see past updates related to this goal by clicking on the "Past Updates" button in the selected goal         Follow-up Plan The care management team will reach out to the patient again over the next 7 days.   Chong Sicilian, BSN, RN-BC Embedded Chronic Care Manager [facility] / [place] Management Direct Dial: [phone]

## 2020-09-15 ENCOUNTER — Telehealth: Payer: Self-pay | Admitting: Pharmacist

## 2020-09-15 ENCOUNTER — Other Ambulatory Visit: Payer: Self-pay

## 2020-09-15 ENCOUNTER — Encounter: Payer: Self-pay | Admitting: Family Medicine

## 2020-09-15 ENCOUNTER — Ambulatory Visit (INDEPENDENT_AMBULATORY_CARE_PROVIDER_SITE_OTHER): Payer: Medicare Other

## 2020-09-15 ENCOUNTER — Ambulatory Visit: Payer: Self-pay | Admitting: Pharmacist

## 2020-09-15 ENCOUNTER — Ambulatory Visit (INDEPENDENT_AMBULATORY_CARE_PROVIDER_SITE_OTHER): Payer: Medicare Other | Admitting: Family Medicine

## 2020-09-15 VITALS — BP 138/65 | HR 82 | Temp 98.2°F | Resp 18 | Ht 63.0 in | Wt 183.8 lb

## 2020-09-15 DIAGNOSIS — J441 Chronic obstructive pulmonary disease with (acute) exacerbation: Secondary | ICD-10-CM | POA: Diagnosis not present

## 2020-09-15 DIAGNOSIS — J432 Centrilobular emphysema: Secondary | ICD-10-CM | POA: Diagnosis not present

## 2020-09-15 DIAGNOSIS — I5042 Chronic combined systolic (congestive) and diastolic (congestive) heart failure: Secondary | ICD-10-CM | POA: Diagnosis not present

## 2020-09-15 DIAGNOSIS — I739 Peripheral vascular disease, unspecified: Secondary | ICD-10-CM

## 2020-09-15 DIAGNOSIS — H6503 Acute serous otitis media, bilateral: Secondary | ICD-10-CM

## 2020-09-15 DIAGNOSIS — I2510 Atherosclerotic heart disease of native coronary artery without angina pectoris: Secondary | ICD-10-CM

## 2020-09-15 DIAGNOSIS — R0602 Shortness of breath: Secondary | ICD-10-CM

## 2020-09-15 DIAGNOSIS — R059 Cough, unspecified: Secondary | ICD-10-CM | POA: Diagnosis not present

## 2020-09-15 DIAGNOSIS — I517 Cardiomegaly: Secondary | ICD-10-CM | POA: Diagnosis not present

## 2020-09-15 MED ORDER — BREO ELLIPTA 100-25 MCG/INH IN AEPB: 1.0000 | INHALATION_SPRAY | Freq: Every day | RESPIRATORY_TRACT | 11 refills | Status: DC

## 2020-09-15 MED ORDER — LEVOFLOXACIN 500 MG PO TABS: ORAL_TABLET | ORAL | 0 refills | Status: DC

## 2020-09-15 NOTE — Progress Notes (Signed)
Subjective:  Patient ID: [name], female    DOB: [birth year]  Age: 69 y.o. MRN: [id number]  CC: Hospitalization Follow-up   HPI [name] presents for [facility] for [facility]. Admitted there on 9/27 with dyspnea. Found to have CHF and was diuresed aggressively via dialysis. She had to have a procedure on her shunt to stop some bleeding.  She is also treated for COPD with respiratory failure during her stay.  BP dropped at one point. DCedon 10/1 to home. Now feeling dyspneic, congested. Right ear pain and productive cough. Denies fever. No edema since DC.  Currently she is having cough and congestionAnd right ear pain with a sensation of fluid behind the eardrum.   Depression screen [facility] 2/9 09/15/2020 08/30/2020 07/21/2020  Decreased Interest 0 0 0  Down, Depressed, Hopeless 0 0 0  PHQ - 2 Score 0 0 0  Altered sleeping - - -  Tired, decreased energy - - -  Change in appetite - - -  Feeling bad or failure about yourself  - - -  Trouble concentrating - - -  Moving slowly or fidgety/restless - - -  Suicidal thoughts - - -  PHQ-9 Score - - -  Some recent data might be hidden    History [name] has a past medical history of Acute on chronic diastolic CHF (congestive heart failure) ([place]) (03/05/2015), Anemia, Anxiety, Arthritis, CAD (coronary artery disease), Chronic kidney disease, Chronic neck pain, COPD (chronic obstructive pulmonary disease) ([place]), Degenerative joint disease, Diabetic neuropathy ([place]), Diverticulosis, Esophageal stricture, GERD (gastroesophageal reflux disease), Hyperlipidemia, Hypertension, IDDM (insulin dependent diabetes mellitus), Pericardial effusion (03/06/2015), Peripheral vascular disease ([place]), Stroke ([employer]), and Vitamin D deficiency.   She has a past surgical history that includes Bladder surgery; Partial hysterectomy; Shoulder surgery (Right); Cervical spine surgery (2012); Abdominal hysterectomy; Laminectomy (12/29/2011); cardiac stents; Lumbar  laminectomy/decompression microdiscectomy (12/29/2011); Back surgery; Cystoscopy with injection (N/A, 06/03/2013); Cardiac catheterization (2003, 2005, 2009); Cataract extraction w/PHACO (Right, 05/04/2014); Cataract extraction w/PHACO (Left, 07/20/2014); AV fistula placement (Right, 07/20/2015); Cardiac catheterization (N/A, 11/22/2015); Insertion of dialysis catheter (N/A, 11/26/2015); AV fistula placement (Right, 11/26/2015); Fistulogram (Right, 10/20/2016); A/V Fistulagram (N/A, 08/10/2017); PERIPHERAL VASCULAR INTERVENTION (08/10/2017); A/V Fistulagram (N/A, 03/03/2020); and PERIPHERAL VASCULAR BALLOON ANGIOPLASTY (03/03/2020).   Her family history includes Cancer in her father; Diabetes in her daughter; Heart disease in an other family member; Hypertension in her daughter, daughter, father, and mother; Pancreatitis in her brother.She reports that she quit smoking about 18 years ago. She has a 30.00 pack-year smoking history. She has never used smokeless tobacco. She reports that she does not drink alcohol and does not use drugs.    ROS Review of Systems  Constitutional: Positive for activity change and fatigue. Negative for appetite change.  HENT: Negative.   Eyes: Negative for visual disturbance.  Respiratory: Positive for cough and shortness of breath.   Cardiovascular: Negative for chest pain.  Gastrointestinal: Negative for abdominal pain.  Musculoskeletal: Negative for arthralgias.    Objective:  BP 138/65   Pulse 82   Temp 98.2 F (36.8 C) (Temporal)   Resp 18   Ht 5' 3" (1.6 m)   Wt 183 lb 12.8 oz (83.4 kg)   SpO2 99%   BMI 32.56 kg/m   BP Readings from Last 3 Encounters:  09/15/20 138/65  08/30/20 (!) 145/81  08/25/20 130/65    Wt Readings from Last 3 Encounters:  09/15/20 183 lb 12.8 oz (83.4 kg)  08/30/20 188 lb (85.3 kg)  08/25/20 189 lb (85.7 kg)     Physical Exam Constitutional:      General: She is not in acute distress.    Appearance: She is well-developed.   HENT:     Head: Normocephalic and atraumatic.     Comments: Ears have fluid posteriorly behind the eardrums    Left Ear: Tympanic membrane normal.  Neck:     Thyroid: No thyromegaly.  Cardiovascular:     Rate and Rhythm: Normal rate and regular rhythm.     Heart sounds: Normal heart sounds. No murmur heard.   Pulmonary:     Effort: Pulmonary effort is normal. No respiratory distress.     Breath sounds: Normal breath sounds. No wheezing or rales.  Abdominal:     General: There is no distension.     Palpations: Abdomen is soft.     Tenderness: There is no abdominal tenderness.  Musculoskeletal:        General: Normal range of motion.     Cervical back: Normal range of motion and neck supple.  Lymphadenopathy:     Cervical: No cervical adenopathy.  Skin:    General: Skin is warm and dry.  Neurological:     Mental Status: She is alert and oriented to person, place, and time.  Psychiatric:        Behavior: Behavior normal.      CXR: NAD, no cadiomegaly Assessment & Plan:   [name] was seen today for hospitalization follow-up.  Diagnoses and all orders for this visit:  Shortness of breath -     DG Chest 2 View; Future -     CMP14+EGFR -     Pro b natriuretic peptide  Centrilobular emphysema (HCC)  Bilateral acute serous otitis media, recurrence not specified  Chronic combined systolic and diastolic congestive heart failure (HCC)  Acute exacerbation of chronic obstructive pulmonary disease (COPD) ([place])  Other orders -     levofloxacin (LEVAQUIN) 500 MG tablet; Take on right away then every other day, if on dialysis day take  after dialysis -     fluticasone furoate-vilanterol (BREO ELLIPTA) 100-25 MCG/INH AEPB; Inhale 1 puff into the lungs daily.   [name] is a dialysis patient who was diuresed through the dialysis to treat her congestive heart failure as result of the shortness of breath that led to her admission.  She is also under treatment for COPD.  And  appears to be having a bit of an exacerbation today of that along with some ear pain.    I have discontinued Jodel Mayhall. Brammer's budesonide-formoterol and predniSONE. I am also having her start on levofloxacin and Breo Ellipta. Additionally, I am having her maintain her aspirin, calcium acetate, torsemide, hydrALAZINE, nitroGLYCERIN, busPIRone, acetaminophen, Dialyvite 800, NIFEdipine, gabapentin, triamcinolone cream, cetirizine, pravastatin, Lantus SoloStar, atenolol, ondansetron, Trulicity, Carboxymethylcellul-Glycerin (LUBRICATING EYE DROPS OP), albuterol, albuterol, and OneTouch Ultra.  Allergies as of 09/15/2020      Reactions   Azithromycin    Prolonged QT   Ace Inhibitors Cough   Clopidogrel Bisulfate Other (See Comments)   Sick, Headache, "Felt terrible"   Codeine Other (See Comments)   hallucinations   Lisinopril Cough   Penicillins Other (See Comments)   Unknown reaction Did it involve swelling of the face/tongue/throat, SOB, or low BP? Unknown Did it involve sudden or severe rash/hives, skin peeling, or any reaction on the inside of your mouth or nose? Unknown Did you need to seek medical attention at a hospital or doctor's office? Unknown When  did it last happen?childhood allergy If all above answers are "NO", may proceed with cephalosporin use.      Medication List       Accurate as of September 15, 2020 11:59 PM. If you have any questions, ask your nurse or doctor.        STOP taking these medications   budesonide-formoterol 160-4.5 MCG/ACT inhaler Commonly known as: SYMBICORT Stopped by: [name], MD   predniSONE 20 MG tablet Commonly known as: DELTASONE Stopped by: [name], MD     TAKE these medications   acetaminophen 500 MG tablet Commonly known as: TYLENOL Take 1,000 mg by mouth every 6 (six) hours as needed for moderate pain or headache.   albuterol 108 (90 Base) MCG/ACT inhaler Commonly known as: VENTOLIN HFA Inhale 2 puffs into the  lungs every 6 (six) hours as needed for wheezing or shortness of breath.   albuterol (2.5 MG/3ML) 0.083% nebulizer solution Commonly known as: PROVENTIL Take 3 mLs (2.5 mg total) by nebulization every 6 (six) hours as needed for wheezing or shortness of breath.   aspirin 81 MG EC tablet Take 81 mg by mouth daily.   atenolol 50 MG tablet Commonly known as: TENORMIN Take 1 tablet (50 mg total) by mouth 2 (two) times daily.   Breo Ellipta 100-25 MCG/INH Aepb Generic drug: fluticasone furoate-vilanterol Inhale 1 puff into the lungs daily. Started by: [name], MD   busPIRone 7.5 MG tablet Commonly known as: BUSPAR Take 1 tablet (7.5 mg total) by mouth 3 (three) times daily as needed. What changed: reasons to take this   calcium acetate 667 MG capsule Commonly known as: PHOSLO Take 667-1,334 mg by mouth See admin instructions. Take 1334 mg with meals 3 times daily and 667 mg with snacks   cetirizine 10 MG tablet Commonly known as: ZYRTEC Take 1 tablet (10 mg total) by mouth daily as needed (itch). What changed: reasons to take this   Dialyvite 800 0.8 MG Tabs Take 1 tablet by mouth daily.   gabapentin 300 MG capsule Commonly known as: NEURONTIN Take two after each dialysis What changed:   how much to take  how to take this  when to take this  additional instructions   hydrALAZINE 50 MG tablet Commonly known as: APRESOLINE Take 1 tablet (50 mg total) by mouth 2 (two) times daily.   Lantus SoloStar 100 UNIT/ML Solostar Pen Generic drug: insulin glargine INJECT  20 UNITS INTO THE SKIN DAILY AT 10 PM. What changed:   how much to take  how to take this  when to take this  additional instructions   levofloxacin 500 MG tablet Commonly known as: LEVAQUIN Take on right away then every other day, if on dialysis day take  after dialysis Started by: [name], MD   LUBRICATING EYE DROPS OP Place 1 drop into both eyes daily as needed (dry eyes).    NIFEdipine 90 MG 24 hr tablet Commonly known as: PROCARDIA XL/NIFEDICAL-XL Take 90 mg by mouth daily.   nitroGLYCERIN 0.4 MG SL tablet Commonly known as: NITROSTAT Place 1 tablet (0.4 mg total) under the tongue every 5 (five) minutes as needed for chest pain.   ondansetron 4 MG tablet Commonly known as: Zofran Take 1 tablet (4 mg total) by mouth every 8 (eight) hours as needed for nausea or vomiting.   OneTouch Ultra test strip Generic drug: glucose blood Use to test blood sugar 3 times daily as directed. DX: E11.9   pravastatin 20 MG  tablet Commonly known as: PRAVACHOL Take 1 tablet by mouth once daily   torsemide 20 MG tablet Commonly known as: DEMADEX Take 40 mg by mouth daily.   triamcinolone cream 0.1 % Commonly known as: KENALOG Apply 1 application topically 3 (three) times daily. Avoid face and genitalia What changed:   when to take this  reasons to take this   Trulicity 1.5 IO/2.7OJ Sopn Generic drug: Dulaglutide Inject 0.5 mLs (1.5 mg total) into the skin once a week. What changed: when to take this        Follow-up: Return in about 3 months (around 12/16/2020).  [name], M.D.

## 2020-09-15 NOTE — Telephone Encounter (Signed)
APPLICATION FILLED OUT AND PREPARED FOR SYMBICORT VIA [facility]  THIS WILL REPLACE BREO ELLIPTA

## 2020-09-16 LAB — CMP14+EGFR
ALT: 16 IU/L (ref 0–32)
AST: 13 IU/L (ref 0–40)
Albumin/Globulin Ratio: 1.8 (ref 1.2–2.2)
Albumin: 4.1 g/dL (ref 3.8–4.8)
Alkaline Phosphatase: 109 IU/L (ref 44–121)
BUN/Creatinine Ratio: 5 — ABNORMAL LOW (ref 12–28)
BUN: 37 mg/dL — ABNORMAL HIGH (ref 8–27)
Bilirubin Total: 0.3 mg/dL (ref 0.0–1.2)
CO2: 28 mmol/L (ref 20–29)
Calcium: 8.2 mg/dL — ABNORMAL LOW (ref 8.7–10.3)
Chloride: 94 mmol/L — ABNORMAL LOW (ref 96–106)
Creatinine, Ser: 8.2 mg/dL (ref 0.57–1.00)
GFR calc Af Amer: 5 mL/min/{1.73_m2} — ABNORMAL LOW (ref >59)
GFR calc non Af Amer: 5 mL/min/{1.73_m2} — ABNORMAL LOW (ref >59)
Globulin, Total: 2.3 g/dL (ref 1.5–4.5)
Glucose: 116 mg/dL — ABNORMAL HIGH (ref 65–99)
Potassium: 4.2 mmol/L (ref 3.5–5.2)
Sodium: 140 mmol/L (ref 134–144)
Total Protein: 6.4 g/dL (ref 6.0–8.5)

## 2020-09-16 LAB — PRO B NATRIURETIC PEPTIDE: NT-Pro BNP: 16086 pg/mL — ABNORMAL HIGH (ref 0–301)

## 2020-09-19 ENCOUNTER — Encounter: Payer: Self-pay | Admitting: Family Medicine

## 2020-09-20 DIAGNOSIS — Z6832 Body mass index (BMI) 32.0-32.9, adult: Secondary | ICD-10-CM | POA: Diagnosis not present

## 2020-09-20 DIAGNOSIS — R059 Cough, unspecified: Secondary | ICD-10-CM | POA: Diagnosis not present

## 2020-09-20 DIAGNOSIS — J069 Acute upper respiratory infection, unspecified: Secondary | ICD-10-CM | POA: Diagnosis not present

## 2020-09-20 DIAGNOSIS — R109 Unspecified abdominal pain: Secondary | ICD-10-CM | POA: Diagnosis not present

## 2020-09-20 DIAGNOSIS — R0981 Nasal congestion: Secondary | ICD-10-CM | POA: Diagnosis not present

## 2020-09-21 ENCOUNTER — Telehealth: Payer: Self-pay | Admitting: Pharmacist

## 2020-09-21 ENCOUNTER — Ambulatory Visit: Payer: Medicare Other | Admitting: *Deleted

## 2020-09-21 DIAGNOSIS — N186 End stage renal disease: Secondary | ICD-10-CM | POA: Diagnosis not present

## 2020-09-21 DIAGNOSIS — J441 Chronic obstructive pulmonary disease with (acute) exacerbation: Secondary | ICD-10-CM | POA: Diagnosis not present

## 2020-09-21 DIAGNOSIS — J449 Chronic obstructive pulmonary disease, unspecified: Secondary | ICD-10-CM

## 2020-09-21 DIAGNOSIS — I5032 Chronic diastolic (congestive) heart failure: Secondary | ICD-10-CM

## 2020-09-21 NOTE — Telephone Encounter (Signed)
Patient's shipment of basaglar is here for pickup (in fridge) via [facility] patient assistance Still awaiting trulicity and symbicort shipments Patient aware

## 2020-09-21 NOTE — Patient Instructions (Signed)
Visit Information  Goals Addressed              This Visit's Progress     Patient Stated   .  "I want my breathing to get better" (pt-stated)        CARE PLAN ENTRY (see longitudinal plan of care for additional care plan information)  Current Barriers:  . Chronic Disease Management support and education needs related to COPD . ESRD, HTN, CHF  Nurse Case Manager Clinical Goal(s):  [name] Over the next 30 days, patient will have visit with pulmonologist . Over the next 15 days, patient will contact PCP or emergency services with any new or worsening symptoms . Over the next 30 days, patient will talk with RN Care Manager re: COPD Management  Interventions:  . Inter-disciplinary care team collaboration (see longitudinal plan of care) . Chart reviewd including office notes, hospital notes, discharge notes, and lab results . Discussed last OV with Dr Livia Snellen for COPD exacerbation and CHF f/u and acute OM and visit to Urgent Care yesterday for abdominal pain o Discussed need to take Levaquin with food b/c that can cause abd pain and nausea o Pain is better today since taking Levaquin with food . Discussed HPI o overall patient is feeling better o Recommended Flonase for nasal/sinus congestion and fluid in middle ear o Recommended to use saline nasal spray before flonase . Reviewed and discussed appt with [facility] for 10/11/2020 . Previously provided with RN Care Manager contact number and encouraged to reach out as needed Patient Self Care Activities:  . Performs ADL's independently . Performs IADL's independently  Please see past updates related to this goal by clicking on the "Past Updates" button in the selected goal         Patient verbalizes understanding of instructions provided today.   Follow-up Plan  Telephone follow up appointment with care management team member scheduled for: 10/18/20 Next PCP appointment scheduled for: 11/01/20 [facility] appt  10/11/2020   Chong Sicilian, BSN, RN-BC [place] [facility] [place] [facility] Direct Dial: [phone]

## 2020-09-21 NOTE — Chronic Care Management (AMB) (Signed)
Chronic Care Management   Follow Up Note   09/21/2020 Name: [name] MRN: [id number] DOB: 1951-11-10  Referred by: [name], MD Reason for referral : Chronic Care Management (RN Follow up)   [name] is a 69 y.o. year old female who is a primary care patient of [name], [name], MD. The CCM team was consulted for assistance with chronic disease management and care coordination needs.    Review of patient status, including review of consultants reports, relevant laboratory and other test results, and collaboration with appropriate care team members and the patient's provider was performed as part of comprehensive patient evaluation and provision of chronic care management services.    SDOH (Social Determinants of Health) assessments performed: No See Care Plan activities for detailed interventions related to [facility])     Outpatient Encounter Medications as of 09/21/2020  Medication Sig  . acetaminophen (TYLENOL) 500 MG tablet Take 1,000 mg by mouth every 6 (six) hours as needed for moderate pain or headache.  . albuterol (PROVENTIL) (2.5 MG/3ML) 0.083% nebulizer solution Take 3 mLs (2.5 mg total) by nebulization every 6 (six) hours as needed for wheezing or shortness of breath.  [name] albuterol (VENTOLIN HFA) 108 (90 Base) MCG/ACT inhaler Inhale 2 puffs into the lungs every 6 (six) hours as needed for wheezing or shortness of breath.  [name] aspirin 81 MG EC tablet Take 81 mg by mouth daily.    [name] atenolol (TENORMIN) 50 MG tablet Take 1 tablet (50 mg total) by mouth 2 (two) times daily.  . B Complex-C-Folic Acid (DIALYVITE 403) 0.8 MG TABS Take 1 tablet by mouth daily.  . busPIRone (BUSPAR) 7.5 MG tablet Take 1 tablet (7.5 mg total) by mouth 3 (three) times daily as needed. (Patient taking differently: Take 7.5 mg by mouth 3 (three) times daily as needed (anxiety). )  . calcium acetate (PHOSLO) 667 MG capsule Take 667-1,334 mg by mouth See admin instructions. Take 1334 mg with meals  3 times daily and 667 mg with snacks  . Carboxymethylcellul-Glycerin (LUBRICATING EYE DROPS OP) Place 1 drop into both eyes daily as needed (dry eyes).  . cetirizine (ZYRTEC) 10 MG tablet Take 1 tablet (10 mg total) by mouth daily as needed (itch). (Patient taking differently: Take 10 mg by mouth daily as needed for allergies. )  . Dulaglutide (TRULICITY) 1.5 KV/4.2VZ SOPN Inject 0.5 mLs (1.5 mg total) into the skin once a week. (Patient taking differently: Inject 1.5 mg into the skin every Friday. )  . fluticasone furoate-vilanterol (BREO ELLIPTA) 100-25 MCG/INH AEPB Inhale 1 puff into the lungs daily.  [name] gabapentin (NEURONTIN) 300 MG capsule Take two after each dialysis (Patient taking differently: Take 600 mg by mouth See admin instructions. Take 600 mg after dialysis on Tues, Thurs, and Sat)  . glucose blood (ONETOUCH ULTRA) test strip Use to test blood sugar 3 times daily as directed. DX: E11.9  . hydrALAZINE (APRESOLINE) 50 MG tablet Take 1 tablet (50 mg total) by mouth 2 (two) times daily.  . insulin glargine (LANTUS SOLOSTAR) 100 UNIT/ML Solostar Pen INJECT  20 UNITS INTO THE SKIN DAILY AT 10 PM. (Patient taking differently: Inject 20 Units into the skin at bedtime. )  . levofloxacin (LEVAQUIN) 500 MG tablet Take on right away then every other day, if on dialysis day take  after dialysis  . NIFEdipine (PROCARDIA XL/NIFEDICAL-XL) 90 MG 24 hr tablet Take 90 mg by mouth daily.   . nitroGLYCERIN (NITROSTAT) 0.4 MG SL tablet Place 1  tablet (0.4 mg total) under the tongue every 5 (five) minutes as needed for chest pain.  [name] ondansetron (ZOFRAN) 4 MG tablet Take 1 tablet (4 mg total) by mouth every 8 (eight) hours as needed for nausea or vomiting.  . pravastatin (PRAVACHOL) 20 MG tablet Take 1 tablet by mouth once daily (Patient taking differently: Take 20 mg by mouth daily. )  . torsemide (DEMADEX) 20 MG tablet Take 40 mg by mouth daily.   [name] triamcinolone cream (KENALOG) 0.1 % Apply 1 application  topically 3 (three) times daily. Avoid face and genitalia (Patient taking differently: Apply 1 application topically 3 (three) times daily as needed (rash). Avoid face and genitalia)   No facility-administered encounter medications on file as of 09/21/2020.      Goals Addressed              This Visit's Progress     Patient Stated   .  "I want my breathing to get better" (pt-stated)        CARE PLAN ENTRY (see longitudinal plan of care for additional care plan information)  Current Barriers:  . Chronic Disease Management support and education needs related to COPD . ESRD, HTN, CHF  Nurse Case Manager Clinical Goal(s):  [name] Over the next 30 days, patient will have visit with pulmonologist . Over the next 15 days, patient will contact PCP or emergency services with any new or worsening symptoms . Over the next 30 days, patient will talk with RN Care Manager re: COPD Management  Interventions:  . Inter-disciplinary care team collaboration (see longitudinal plan of care) . Chart reviewd including office notes, hospital notes, discharge notes, and lab results . Discussed last OV with Dr Livia Snellen for COPD exacerbation and CHF f/u and acute OM and visit to Urgent Care yesterday for abdominal pain o Discussed need to take Levaquin with food b/c that can cause abd pain and nausea o Pain is better today since taking Levaquin with food . Discussed HPI o overall patient is feeling better o Recommended Flonase for nasal/sinus congestion and fluid in middle ear o Recommended to use saline nasal spray before flonase . Reviewed and discussed appt with [facility] for 10/11/2020 . Previously provided with RN Care Manager contact number and encouraged to reach out as needed Patient Self Care Activities:  . Performs ADL's independently . Performs IADL's independently  Please see past updates related to this goal by clicking on the "Past Updates" button in the selected goal           Plan:   Telephone follow up appointment with care management team member scheduled for: 10/18/20 Next PCP appointment scheduled for: 11/01/20 [facility] appt 10/11/2020   Chong Sicilian, BSN, RN-BC [place] [facility] [place] Management Direct Dial: [phone]

## 2020-09-24 ENCOUNTER — Other Ambulatory Visit: Payer: Self-pay | Admitting: *Deleted

## 2020-09-24 ENCOUNTER — Other Ambulatory Visit: Payer: Self-pay | Admitting: Family Medicine

## 2020-09-24 DIAGNOSIS — I739 Peripheral vascular disease, unspecified: Secondary | ICD-10-CM

## 2020-09-24 DIAGNOSIS — E1169 Type 2 diabetes mellitus with other specified complication: Secondary | ICD-10-CM

## 2020-09-28 ENCOUNTER — Other Ambulatory Visit: Payer: Self-pay | Admitting: *Deleted

## 2020-09-28 DIAGNOSIS — Z992 Dependence on renal dialysis: Secondary | ICD-10-CM | POA: Diagnosis not present

## 2020-09-28 DIAGNOSIS — F411 Generalized anxiety disorder: Secondary | ICD-10-CM

## 2020-09-28 MED ORDER — BUSPIRONE HCL 7.5 MG PO TABS: 7.5000 mg | ORAL_TABLET | Freq: Three times a day (TID) | ORAL | 0 refills | Status: DC | PRN

## 2020-10-01 ENCOUNTER — Other Ambulatory Visit: Payer: Self-pay

## 2020-10-01 ENCOUNTER — Ambulatory Visit
Admission: RE | Admit: 2020-10-01 | Discharge: 2020-10-01 | Disposition: A | Discharge status: Home / Self Care | Payer: Medicare Other | Source: Ambulatory Visit | Attending: Vascular Surgery | Admitting: Vascular Surgery

## 2020-10-01 DIAGNOSIS — I70203 Unspecified atherosclerosis of native arteries of extremities, bilateral legs: Secondary | ICD-10-CM | POA: Diagnosis not present

## 2020-10-01 DIAGNOSIS — I739 Peripheral vascular disease, unspecified: Secondary | ICD-10-CM

## 2020-10-01 DIAGNOSIS — N281 Cyst of kidney, acquired: Secondary | ICD-10-CM | POA: Diagnosis not present

## 2020-10-01 DIAGNOSIS — I701 Atherosclerosis of renal artery: Secondary | ICD-10-CM | POA: Diagnosis not present

## 2020-10-01 DIAGNOSIS — I708 Atherosclerosis of other arteries: Secondary | ICD-10-CM | POA: Diagnosis not present

## 2020-10-01 MED ORDER — IOPAMIDOL (ISOVUE-370) INJECTION 76%
100.0000 mL | Freq: Once | INTRAVENOUS | Status: AC | PRN
  Administered 2020-10-01: 100 mL via INTRAVENOUS

## 2020-10-03 DIAGNOSIS — Z992 Dependence on renal dialysis: Secondary | ICD-10-CM | POA: Diagnosis not present

## 2020-10-03 DIAGNOSIS — N186 End stage renal disease: Secondary | ICD-10-CM | POA: Diagnosis not present

## 2020-10-05 DIAGNOSIS — N2581 Secondary hyperparathyroidism of renal origin: Secondary | ICD-10-CM | POA: Diagnosis not present

## 2020-10-05 DIAGNOSIS — D509 Iron deficiency anemia, unspecified: Secondary | ICD-10-CM | POA: Diagnosis not present

## 2020-10-05 DIAGNOSIS — Z992 Dependence on renal dialysis: Secondary | ICD-10-CM | POA: Diagnosis not present

## 2020-10-05 DIAGNOSIS — D631 Anemia in chronic kidney disease: Secondary | ICD-10-CM | POA: Diagnosis not present

## 2020-10-05 DIAGNOSIS — N186 End stage renal disease: Secondary | ICD-10-CM | POA: Diagnosis not present

## 2020-10-06 ENCOUNTER — Ambulatory Visit (INDEPENDENT_AMBULATORY_CARE_PROVIDER_SITE_OTHER): Payer: Medicare Other | Admitting: Vascular Surgery

## 2020-10-06 ENCOUNTER — Other Ambulatory Visit: Payer: Self-pay

## 2020-10-06 ENCOUNTER — Encounter: Payer: Self-pay | Admitting: Vascular Surgery

## 2020-10-06 VITALS — BP 146/81 | HR 76 | Temp 98.0°F | Resp 20 | Ht 63.0 in | Wt 183.0 lb

## 2020-10-06 DIAGNOSIS — I2510 Atherosclerotic heart disease of native coronary artery without angina pectoris: Secondary | ICD-10-CM | POA: Diagnosis not present

## 2020-10-06 DIAGNOSIS — I739 Peripheral vascular disease, unspecified: Secondary | ICD-10-CM

## 2020-10-06 NOTE — Progress Notes (Signed)
Patient name: [name] MRN: [id number] DOB: [birth year] Sex: female  REASON FOR VISIT:   To discuss the results of CT angiogram  HPI:   [name] is a pleasant 69 y.o. female who I saw on 08/25/2020 with peripheral vascular disease and claudication.  I cannot palpate femoral pulses and set her up for a CT angiogram.  She comes in to discuss those results.  In addition, she was having significant problems with bleeding from her right upper arm fistula.  The fistula was pulsatile and I set her up for a fistulogram which was canceled because she was seen with shortness of breath and ultimately ended up in [facility] where they fixed her fistula.  Fistula is now working better.  With respect to her claudication she continues to have claudication in both lower extremities which is more significant on the right side.  Her symptoms are brought on by ambulation and relieved with rest.  Symptoms are mostly in her calf.  She denies any rest pain or nonhealing ulcers.  She is not a smoker.  Current Outpatient Medications  Medication Sig Dispense Refill  . acetaminophen (TYLENOL) 500 MG tablet Take 1,000 mg by mouth every 6 (six) hours as needed for moderate pain or headache.    . albuterol (PROVENTIL) (2.5 MG/3ML) 0.083% nebulizer solution Take 3 mLs (2.5 mg total) by nebulization every 6 (six) hours as needed for wheezing or shortness of breath. 75 mL 12  . albuterol (VENTOLIN HFA) 108 (90 Base) MCG/ACT inhaler Inhale 2 puffs into the lungs every 6 (six) hours as needed for wheezing or shortness of breath. 1 each 2  . aspirin 81 MG EC tablet Take 81 mg by mouth daily.      [name] atenolol (TENORMIN) 50 MG tablet Take 1 tablet (50 mg total) by mouth 2 (two) times daily. 180 tablet 1  . B Complex-C-Folic Acid (DIALYVITE 741) 0.8 MG TABS Take 1 tablet by mouth daily.    . busPIRone (BUSPAR) 7.5 MG tablet Take 1 tablet (7.5 mg total) by mouth 3 (three) times daily as needed. (Needs to be seen  before next refill) 90 tablet 0  . calcium acetate (PHOSLO) 667 MG capsule Take 667-1,334 mg by mouth See admin instructions. Take 1334 mg with meals 3 times daily and 667 mg with snacks    . Carboxymethylcellul-Glycerin (LUBRICATING EYE DROPS OP) Place 1 drop into both eyes daily as needed (dry eyes).    . cetirizine (ZYRTEC) 10 MG tablet Take 1 tablet (10 mg total) by mouth daily as needed (itch). (Patient taking differently: Take 10 mg by mouth daily as needed for allergies. ) 90 tablet 1  . Dulaglutide (TRULICITY) 1.5 OI/7.8MV SOPN Inject 0.5 mLs (1.5 mg total) into the skin once a week. (Patient taking differently: Inject 1.5 mg into the skin every Friday. ) 2 mL 1  . fluticasone furoate-vilanterol (BREO ELLIPTA) 100-25 MCG/INH AEPB Inhale 1 puff into the lungs daily. 1 each 11  . gabapentin (NEURONTIN) 300 MG capsule Take two after each dialysis (Patient taking differently: Take 600 mg by mouth See admin instructions. Take 600 mg after dialysis on Tues, Thurs, and Sat) 60 capsule 2  . glucose blood (ONETOUCH ULTRA) test strip Use to test blood sugar 3 times daily as directed. DX: E11.9 300 each 12  . insulin glargine (LANTUS SOLOSTAR) 100 UNIT/ML Solostar Pen INJECT  20 UNITS INTO THE SKIN DAILY AT 10 PM. (Patient taking differently: Inject 20 Units into the  skin at bedtime. ) 15 mL 4  . levofloxacin (LEVAQUIN) 500 MG tablet Take on right away then every other day, if on dialysis day take  after dialysis 7 tablet 0  . NIFEdipine (PROCARDIA XL/NIFEDICAL-XL) 90 MG 24 hr tablet Take 90 mg by mouth daily.     . nitroGLYCERIN (NITROSTAT) 0.4 MG SL tablet Place 1 tablet (0.4 mg total) under the tongue every 5 (five) minutes as needed for chest pain. 25 tablet 11  . ondansetron (ZOFRAN) 4 MG tablet Take 1 tablet (4 mg total) by mouth every 8 (eight) hours as needed for nausea or vomiting. 20 tablet 3  . pravastatin (PRAVACHOL) 20 MG tablet Take 1 tablet by mouth once daily 90 tablet 0  . torsemide  (DEMADEX) 20 MG tablet Take 40 mg by mouth daily.     [name] triamcinolone cream (KENALOG) 0.1 % Apply 1 application topically 3 (three) times daily. Avoid face and genitalia (Patient taking differently: Apply 1 application topically 3 (three) times daily as needed (rash). Avoid face and genitalia) 45 g 0  . hydrALAZINE (APRESOLINE) 50 MG tablet Take 1 tablet (50 mg total) by mouth 2 (two) times daily. 60 tablet 3   No current facility-administered medications for this visit.    REVIEW OF SYSTEMS:  [X]  denotes positive finding, [ ]  denotes negative finding Vascular    Leg swelling    Cardiac    Chest pain or chest pressure:    Shortness of breath upon exertion:    Short of breath when lying flat:    Irregular heart rhythm:    Constitutional    Fever or chills:     PHYSICAL EXAM:   Vitals:   10/06/20 1035  BP: (!) 146/81  Pulse: 76  Resp: 20  Temp: 98 F (36.7 C)  SpO2: 98%  Weight: 183 lb (83 kg)  Height: 5\' 3"  (1.6 m)    GENERAL: The patient is a well-nourished female, in no acute distress. The vital signs are documented above. CARDIOVASCULAR: There is a regular rate and rhythm. PULMONARY: There is good air exchange bilaterally without wheezing or rales. VASCULAR: I cannot palpate femoral pulses again.  I cannot palpate pedal pulses.  Both feet appear adequately perfused.  DATA:   CT ANGIOGRAM: I reviewed the images of her CT angiogram.  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  There are no focal critical stenoses.  The aortoiliac vessels are noted to be patent although diminutive in caliber with extensive circumferential calcium.   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  She is not a good candidate for either an endovascular procedure or open surgery given her diffuse calcific disease with small arteries.  Fortunately her symptoms are  tolerable.  Her previous noninvasive studies showed an ABI 58% on the right with a toe pressure of 66 mmHg.  On the left side ABI was 57% with a toe pressure of 70 mmHg.  This suggests adequate circulation.  We have again discussed the importance of trying to stay as active as possible.  We also discussed the importance of nutrition.  Fortunately she is not a smoker.  I have ordered follow-up ABIs in 6 months and I will see her back at that time.  She knows to call sooner if she has problems.  Deitra Mayo [facility] [phone]

## 2020-10-07 NOTE — Progress Notes (Signed)
Cardiology Office Note   Date:  10/08/2020   ID:  [name], DOB 07/10/1951, MRN [id number]  PCP:  [name], MD  Cardiologist:   No primary care provider on file.   Chief Complaint  Patient presents with  . Shortness of Breath  . Chest Pain      History of Present Illness: [name] is a 69 y.o. female who presents for follow up of CAD and diastolic heart failure with diastolic dysfunction.   Since I last saw her she was in the hospital in September at [facility].  I reviewed these records in detail.  She was there for respiratory failure and was said to have a COPD exacerbation prompted by a non-Covid viral illness.  However, there was also suggestion of acute on chronic diastolic heart failure.  Her volume is typically managed by dialysis and she did have to have angioplasty of her fistula to proceed with dialysis.  She said her initial weight was 84 kg and her dry weight now is 81 kg.  I did review the records and found that she had an echocardiogram which suggested that her ejection fraction was slightly reduced from previous at 45%.  Since going home they have tried to maintain her volume but she starts to get chest discomfort when they pull too much fluid.  She gets an aching discomfort.  She is not describing this necessarily at rest or on nondialysis days.  She says it discomfort this across her chest.  She is not describing up to her jaw or to her arms.  There is no associated nausea vomiting or diaphoresis.  She does have some low blood pressures that also inhibit aggressive diuresis with dialysis.  She has on her own held her Procardia the night before dialysis at times and her blood pressure is improved.  She denies any PND or orthopnea.  She is not having any palpitations, presyncope or syncope.  She has no edema.  Past Medical History:  Diagnosis Date  . Acute on chronic diastolic CHF (congestive heart failure) ([place]) 03/05/2015   Grade 2. EF 60-65%  . Anemia    hx  of  . Anxiety   . Arthritis   . CAD (coronary artery disease)    s/p Stenting in 2005;  [place] 7/09: Vigorous LV function, 2-3+ MR, RI 40%, RI stent patent, proximal-mid RCA 40-50%;  Echo 7/09:  EF 55-65%, trivial MR;  Myoview 11/18/12: EF 66%, normal LV wall motion, mild to moderate anterior ischemia - reviewed by [facility] and felt to rep breast atten (low risk);  Echo 6/14: mild LVH, EF 55-65%, Gr 2 DD, PASP 44, trivial eff   . Chronic kidney disease    CREATININE IS UP--GOING TO KIDNEY MD   . Chronic neck pain   . COPD (chronic obstructive pulmonary disease) ([place])   . Degenerative joint disease    left shoulder  . Diabetic neuropathy ([place])   . Diverticulosis   . Esophageal stricture   . GERD (gastroesophageal reflux disease)   . Hyperlipidemia   . Hypertension   . IDDM (insulin dependent diabetes mellitus)   . Pericardial effusion 03/06/2015   Very small per echo ; pleural nodules seen on CT chest.  . Peripheral vascular disease ([place])   . Stroke [facility])    "mini- years ago"  . Vitamin D deficiency     Past Surgical History:  Procedure Laterality Date  . A/V FISTULAGRAM N/A 08/10/2017   Procedure: A/V Fistulagram -  Right Arm;  Surgeon: Elam Dutch, MD;  Location: [place] CV LAB;  Service: Cardiovascular;  Laterality: N/A;  . A/V FISTULAGRAM N/A 03/03/2020   Procedure: A/V FISTULAGRAM - Right Arm;  Surgeon: Marty Heck, MD;  Location: [place] CV [facility];  Service: Cardiovascular;  Laterality: N/A;  . ABDOMINAL HYSTERECTOMY    . AV FISTULA PLACEMENT Right 07/20/2015   Procedure: RADIAL CEPHALIC ARTERIOVENOUS FISTULA CREATION RIGHT ARM;  Surgeon: Angelia Mould, MD;  Location: [place];  Service: Vascular;  Laterality: Right;  . AV FISTULA PLACEMENT Right 11/26/2015   Procedure:  Creation of RIGHT BRACHIO-CEPHALIC arteriovenous fistula;  Surgeon: Angelia Mould, MD;  Location: [place];  Service: Vascular;  Laterality: Right;  . BACK SURGERY     2007 & 2013  . BLADDER  SURGERY     Tack  . CARDIAC CATHETERIZATION  2003, 2005, 2009   1 stent  . cardiac stents    . CATARACT EXTRACTION W/PHACO Right 05/04/2014   Procedure: CATARACT EXTRACTION PHACO AND INTRAOCULAR LENS PLACEMENT (IOC);  Surgeon: Williams Che, MD;  Location: [facility];  Service: Ophthalmology;  Laterality: Right;  CDE 1.47  . CATARACT EXTRACTION W/PHACO Left 07/20/2014   Procedure: CATARACT EXTRACTION WITH PHACO AND INTRAOCULAR LENS PLACEMENT LEFT EYE CDE=1.79;  Surgeon: Williams Che, MD;  Location: [facility];  Service: Ophthalmology;  Laterality: Left;  . CERVICAL SPINE SURGERY  2012   8 screws  . CYSTOSCOPY WITH INJECTION N/A 06/03/2013   Procedure: MACROPLASTIQUE  INJECTION ;  Surgeon: Malka So, MD;  Location: [facility];  Service: Urology;  Laterality: N/A;  . FISTULOGRAM Right 10/20/2016   Procedure: FISTULOGRAM WITH POSSIBLE INTERVENTION;  Surgeon: [name], MD;  Location: [facility];  Service: Vascular;  Laterality: Right;  . INSERTION OF DIALYSIS CATHETER N/A 11/26/2015   Procedure: INSERTION OF DIALYSIS CATHETER;  Surgeon: Angelia Mould, MD;  Location: [place];  Service: Vascular;  Laterality: N/A;  . LAMINECTOMY  12/29/2011  . LUMBAR LAMINECTOMY/DECOMPRESSION MICRODISCECTOMY  12/29/2011   Procedure: LUMBAR LAMINECTOMY/DECOMPRESSION MICRODISCECTOMY;  Surgeon: Floyce Stakes, MD;  Location: [place] [facility];  Service: Neurosurgery;  Laterality: N/A;  Lumbar Two through Lumbar Five Laminectomies/Cellsaver  . PARTIAL HYSTERECTOMY    . PERIPHERAL VASCULAR BALLOON ANGIOPLASTY  03/03/2020   Procedure: PERIPHERAL VASCULAR BALLOON ANGIOPLASTY;  Surgeon: Marty Heck, MD;  Location: [place] CV [facility];  Service: Cardiovascular;;  Rt arm fistula  . PERIPHERAL VASCULAR CATHETERIZATION N/A 11/22/2015   Procedure: Fistulagram;  Surgeon: Angelia Mould, MD;  Location: [place] [facility];  Service: Cardiovascular;  Laterality: N/A;  . PERIPHERAL VASCULAR INTERVENTION  08/10/2017    Procedure: PERIPHERAL VASCULAR INTERVENTION;  Surgeon: Elam Dutch, MD;  Location: [place] [facility] LAB;  Service: Cardiovascular;;  . SHOULDER SURGERY Right    Rotator cuff     Current Outpatient Medications  Medication Sig Dispense Refill  . acetaminophen (TYLENOL) 500 MG tablet Take 1,000 mg by mouth every 6 (six) hours as needed for moderate pain or headache.    . albuterol (PROVENTIL) (2.5 MG/3ML) 0.083% nebulizer solution Take 3 mLs (2.5 mg total) by nebulization every 6 (six) hours as needed for wheezing or shortness of breath. 75 mL 12  . albuterol (VENTOLIN HFA) 108 (90 Base) MCG/ACT inhaler Inhale 2 puffs into the lungs every 6 (six) hours as needed for wheezing or shortness of breath. 1 each 2  . aspirin 81 MG EC tablet Take 81 mg by mouth daily.      [name]  atenolol (TENORMIN) 50 MG tablet Take 1 tablet (50 mg total) by mouth 2 (two) times daily. 180 tablet 1  . B Complex-C-Folic Acid (DIALYVITE 297) 0.8 MG TABS Take 1 tablet by mouth daily.    . busPIRone (BUSPAR) 7.5 MG tablet Take 1 tablet (7.5 mg total) by mouth 3 (three) times daily as needed. (Needs to be seen before next refill) 90 tablet 0  . calcium acetate (PHOSLO) 667 MG capsule Take 667-1,334 mg by mouth See admin instructions. Take 1334 mg with meals 3 times daily and 667 mg with snacks    . Carboxymethylcellul-Glycerin (LUBRICATING EYE DROPS OP) Place 1 drop into both eyes daily as needed (dry eyes).    . cetirizine (ZYRTEC) 10 MG tablet Take 1 tablet (10 mg total) by mouth daily as needed (itch). (Patient taking differently: Take 10 mg by mouth daily as needed for allergies. ) 90 tablet 1  . Dulaglutide (TRULICITY) 1.5 LG/9.2JJ SOPN Inject 0.5 mLs (1.5 mg total) into the skin once a week. (Patient taking differently: Inject 1.5 mg into the skin every Friday. ) 2 mL 1  . gabapentin (NEURONTIN) 300 MG capsule Take two after each dialysis (Patient taking differently: Take 600 mg by mouth See admin instructions. Take 600 mg  after dialysis on Tues, Thurs, and Sat) 60 capsule 2  . glucose blood (ONETOUCH ULTRA) test strip Use to test blood sugar 3 times daily as directed. DX: E11.9 300 each 12  . hydrALAZINE (APRESOLINE) 50 MG tablet Take 1 tablet (50 mg total) by mouth 2 (two) times daily. 60 tablet 3  . insulin glargine (LANTUS SOLOSTAR) 100 UNIT/ML Solostar Pen INJECT  20 UNITS INTO THE SKIN DAILY AT 10 PM. (Patient taking differently: Inject 20 Units into the skin at bedtime. ) 15 mL 4  . NIFEdipine (PROCARDIA XL/NIFEDICAL-XL) 90 MG 24 hr tablet Take 90 mg by mouth daily.     . nitroGLYCERIN (NITROSTAT) 0.4 MG SL tablet Place 1 tablet (0.4 mg total) under the tongue every 5 (five) minutes as needed for chest pain. 25 tablet 11  . ondansetron (ZOFRAN) 4 MG tablet Take 1 tablet (4 mg total) by mouth every 8 (eight) hours as needed for nausea or vomiting. 20 tablet 3  . pravastatin (PRAVACHOL) 20 MG tablet Take 1 tablet by mouth once daily 90 tablet 0  . torsemide (DEMADEX) 20 MG tablet Take 40 mg by mouth daily.     [name] triamcinolone cream (KENALOG) 0.1 % Apply 1 application topically 3 (three) times daily. Avoid face and genitalia (Patient taking differently: Apply 1 application topically 3 (three) times daily as needed (rash). Avoid face and genitalia) 45 g 0   No current facility-administered medications for this visit.    Allergies:   Azithromycin, Ace inhibitors, Clopidogrel bisulfate, Codeine, Lisinopril, and Penicillins   ROS:  Please see the history of present illness.   Otherwise, review of systems are positive for none.   All other systems are reviewed and negative.    PHYSICAL EXAM: VS:  BP (!) 124/52   Pulse 78   Ht 5\' 3"  (1.6 m)   Wt 182 lb (82.6 kg)   SpO2 100%   BMI 32.24 kg/m  , BMI Body mass index is 32.24 kg/m. GENERAL:  Well appearing NECK:  No jugular venous distention, waveform within normal limits, carotid upstroke brisk and symmetric, no bruits, no thyromegaly LUNGS:  Clear to  auscultation bilaterally CHEST:  Unremarkable HEART:  PMI not displaced or sustained,S1 and S2 within  normal limits, no S3, no S4, no clicks, no rubs, pansystolic murmur in the right upper sternal border, no diastolic murmurs ABD:  Flat, positive bowel sounds normal in frequency in pitch, no bruits, no rebound, no guarding, no midline pulsatile mass, no hepatomegaly, no splenomegaly EXT:  2 plus pulses 2+ upper pulses, dorsalis pedis and posterior tibialis decreased bilaterally, no edema, no cyanosis no clubbing, right upper arm dialysis graft and fistula with thrill and bruit   EKG:  EKG is  ordered today. The ekg ordered today demonstrates sinus rhythm, rate 78, axis within normal limits, intervals within normal limits, premature atrial contractions, QT prolonged.   Recent Labs: 07/21/2020: Hemoglobin 12.7; Platelets 256 09/15/2020: ALT 16; BUN 37; Creatinine, Ser 8.20; NT-Pro BNP 16,086; Potassium 4.2; Sodium 140    Lipid Panel    Component Value Date/Time   CHOL 161 07/21/2020 1039   CHOL 241 (H) 03/25/2013 1026   TRIG 100 07/21/2020 1039   TRIG 147 10/09/2013 1527   TRIG 209 (H) 03/25/2013 1026   HDL 42 07/21/2020 1039   HDL 65 10/09/2013 1527   HDL 51 03/25/2013 1026   CHOLHDL 3.8 07/21/2020 1039   CHOLHDL 3.0 06/17/2008 0625   VLDL 19 06/17/2008 0625   LDLCALC 101 (H) 07/21/2020 1039   LDLCALC 125 (H) 10/09/2013 1527   LDLCALC 148 (H) 03/25/2013 1026      Wt Readings from Last 3 Encounters:  10/08/20 182 lb (82.6 kg)  10/06/20 183 lb (83 kg)  09/15/20 183 lb 12.8 oz (83.4 kg)      Other studies Reviewed: Additional studies/ records that were reviewed today include: Extensive review of [facility] records.   (Greater than 40 minutes reviewing all data with greater than 50% face to face with the patient). Review of the above records demonstrates:  Please see elsewhere in the note.     ASSESSMENT AND PLAN:  CAD -  She is having chest discomfort with dialysis which she  was not having before.  I need to exclude the an ischemic etiology and will perform a Lexiscan Myoview.  PROLONGED QT - She has a near normal QT.  She should still avoid QT prolonging drugs.  HYPERTENSION, BENIGN -  The blood pressure is actually running low on dialysis days so she is going to hold her Procardia the night before dialysis.  DYSLIPIDEMIA -   LDL was 101 with an HDL of 42.  I will follow this in the future and likely increase her statin.   ACUTE ON CHRONIC DIASTOLIC - Volume is managed by dialysis.  I think the one thing she could contribute to this is much better salt restriction as she likes her salt.   Of note her ejection fraction is now 45% which is lower than previous.  This will be evaluated with the perfusion study as above.  DM - A1C was 7.2.  I will defer to [name], MD     Current medicines are reviewed at length with the patient today.  The patient does not have concerns regarding medicines.  The following changes have been made: As above  Labs/ tests ordered today include:   Orders Placed This Encounter  Procedures  . MYOCARDIAL PERFUSION IMAGING  . EKG 12-Lead     Disposition:   FU with me after the above testing  Signed, Minus Breeding, MD  10/08/2020 12:58 PM    [facility]

## 2020-10-08 ENCOUNTER — Other Ambulatory Visit: Payer: Self-pay

## 2020-10-08 ENCOUNTER — Encounter (HOSPITAL_COMMUNITY): Payer: Self-pay

## 2020-10-08 ENCOUNTER — Encounter: Payer: Self-pay | Admitting: Cardiology

## 2020-10-08 ENCOUNTER — Telehealth: Payer: Self-pay | Admitting: Licensed Clinical Social Worker

## 2020-10-08 ENCOUNTER — Ambulatory Visit (INDEPENDENT_AMBULATORY_CARE_PROVIDER_SITE_OTHER): Payer: Medicare Other | Admitting: Cardiology

## 2020-10-08 VITALS — BP 124/52 | HR 78 | Ht 63.0 in | Wt 182.0 lb

## 2020-10-08 DIAGNOSIS — I2510 Atherosclerotic heart disease of native coronary artery without angina pectoris: Secondary | ICD-10-CM

## 2020-10-08 DIAGNOSIS — I10 Essential (primary) hypertension: Secondary | ICD-10-CM | POA: Diagnosis not present

## 2020-10-08 DIAGNOSIS — E785 Hyperlipidemia, unspecified: Secondary | ICD-10-CM | POA: Diagnosis not present

## 2020-10-08 DIAGNOSIS — I5032 Chronic diastolic (congestive) heart failure: Secondary | ICD-10-CM | POA: Diagnosis not present

## 2020-10-08 DIAGNOSIS — R9431 Abnormal electrocardiogram [ECG] [EKG]: Secondary | ICD-10-CM

## 2020-10-08 NOTE — Telephone Encounter (Signed)
CSW called and left HIPAA compliant voicemail for pt to discuss concerns discussed at clinic today related to screening for kidney transplant. CSW has reached out to [facility] renal navigator and will discuss with CSW team.   Message left at [phone].   CSW continuing to follow for support. Will reattempt pt later today.   Westley Hummer, MSW, [place] [facility]

## 2020-10-08 NOTE — Patient Instructions (Signed)
Medication Instructions:  No changes *If you need a refill on your cardiac medications before your next appointment, please call your pharmacy*   Lab Work: None ordered If you have labs (blood work) drawn today and your tests are completely normal, you will receive your results only by: [name] MyChart Message (if you have MyChart) OR . A paper copy in the mail If you have any lab test that is abnormal or we need to change your treatment, we will call you to review the results.   Testing/Procedures: Your physician has requested that you have a lexiscan myoview. A cardiac stress test is a cardiological test that measures the heart's ability to respond to stress in a controlled clinical environment. The stress response is induced by intravenous pharmacological stimulation. Please follow the instruction sheet as given.  For further information, please visit [email or web address].     Follow-Up: At [facility], you and your health needs are our priority.  As part of our continuing mission to provide you with exceptional heart care, we have created designated Provider Care Teams.  These Care Teams include your primary Cardiologist (physician) and Advanced Practice Providers (APPs -  Physician Assistants and Nurse Practitioners) who all work together to provide you with the care you need, when you need it.  We recommend signing up for the patient portal called "MyChart".  Sign up information is provided on this After Visit Summary.  MyChart is used to connect with patients for Virtual Visits (Telemedicine).  Patients are able to view lab/test results, encounter notes, upcoming appointments, etc.  Non-urgent messages can be sent to your provider as well.   To learn more about what you can do with MyChart, go to [email or web address].    Your next appointment:   1 month(s) in the [facility]  The format for your next appointment:   In Person  Provider:   Dr. Minus Breeding, MD   Other  Instructions None

## 2020-10-11 ENCOUNTER — Other Ambulatory Visit: Payer: Self-pay

## 2020-10-11 ENCOUNTER — Telehealth: Payer: Self-pay | Admitting: Licensed Clinical Social Worker

## 2020-10-11 ENCOUNTER — Encounter: Payer: Self-pay | Admitting: Pulmonary Disease

## 2020-10-11 ENCOUNTER — Ambulatory Visit (INDEPENDENT_AMBULATORY_CARE_PROVIDER_SITE_OTHER): Payer: Medicare Other | Admitting: Pulmonary Disease

## 2020-10-11 VITALS — BP 134/64 | HR 77 | Temp 97.7°F | Ht 63.0 in | Wt 185.8 lb

## 2020-10-11 DIAGNOSIS — G4730 Sleep apnea, unspecified: Secondary | ICD-10-CM

## 2020-10-11 DIAGNOSIS — R0600 Dyspnea, unspecified: Secondary | ICD-10-CM

## 2020-10-11 MED ORDER — SPIRIVA RESPIMAT 1.25 MCG/ACT IN AERS: 2.0000 | INHALATION_SPRAY | Freq: Every day | RESPIRATORY_TRACT | 0 refills | Status: DC

## 2020-10-11 NOTE — Progress Notes (Signed)
Heart and Vascular Care Navigation  10/11/2020  [name] March 29, 1951 [id number]  Reason for Referral: CMA referred for questions regarding cost of transplant, general check in                                                                                                    Assessment: CSW spoke with pt via telephone [phone]. Introduced self, role, reason for call. Pt confirmed home address, phone number, emergency contacts. Pt lives in mobile home alone; she often stays with her mother who is 7 years old just to help out. Pt ambulatory with no DME, she drives herself to all appointments (including dialysis) and denies having any transportation issues. She is able to afford her medications and her bills. She is able to get food, drives herself to store and denies financial challenges at this time.   Pt had been to referred to [facility] in 2018 to learn more about kidney transplant. She states she had taken a friend and felt like the process was more than she was able to do at the time. She states the biggest turn off was the discussion of the funds she needed to raise to have in her accounts. CSW provided support that the process is overwhelming for most. I let pt know that I had spoken with our renal team at [facility] and the cardiology CSW team lead and that the information about the process/funds needed is typical for getting enrolled into the transplant list at an institution. Pt explained that she feels okay with where she is at now- she has a good routine and is not really interested at this time in going through that process. I encouraged her if she was still interested in the future to pursue a referral that she should speak with her nephrologists and the CSW at her dialysis center as they would be able to provide the most up to date information to pt.                                    HRT/VAS Care Coordination    Patients Home Cardiology Office [place]  Team Social Worker   Social Worker Name: Westley Hummer, LCSW, [facility]   Living arrangements for the past 2 months Mobile Home   Lives with: Self   Patient Current Insurance Coverage Traditional Medicare   Patient Has Concern With Paying Medical Bills No   Does Patient Have Prescription Coverage? Yes   Patient Prescription Assistance Programs Other   Other Assistance Programs Medications "Kidney Fund"   Home Assistive Devices/Equipment None      Social History:  SDOH Screenings   Alcohol Screen: Low Risk   . Last Alcohol Screening Score (AUDIT): 0  Depression (PHQ2-9): Low Risk   . PHQ-2 Score: 0  Financial Resource Strain: Low Risk   . Difficulty of Paying Living Expenses: Not hard at all  Food Insecurity: No Food Insecurity  . Worried About Charity fundraiser in the Last Year: Never true  . Ran Out of Food in the Last Year: Never true  Housing: Low Risk   . Last Housing Risk Score: 0  Physical Activity: Inactive  . Days of Exercise per Week: 0 days  . Minutes of Exercise per Session: 0 min  Social Connections: Moderately Integrated  . Frequency of Communication with Friends and Family: More than three times a week  . Frequency of Social Gatherings with Friends and Family: More than three times a week  . Attends Religious Services: More than 4 times per year  . Active Member of Clubs or Organizations: Yes  . Attends Archivist Meetings: More than 4 times per year  . Marital Status: Widowed  Stress: No Stress Concern Present  . Feeling of Stress : Not at all  Tobacco Use: Medium Risk  . Smoking Tobacco Use: Former Smoker  . Smokeless Tobacco Use: Never Used  Transportation Needs: No Transportation Needs  . Lack of Transportation (Medical): No  . Lack of Transportation (Non-Medical): No    SDOH Interventions: Financial Resources:  Financial Strain Interventions: Intervention  Not Indicated  Food Insecurity:   Pt denies having any issues affording food at this time.  Housing Insecurity:  Housing Interventions: Intervention Not Indicated  Transportation:   Transportation Interventions: Intervention Not Indicated    Other Care Navigation Interventions:     Provided Pharmacy assistance resources Other- Pt connected with kidney fund that assists with her medications  Patient expressed Mental Health concerns No, in relatively good spirits, has good social support from mother, daughters and friends   Follow-up plan:  Pt overall in good spirits, she understands if she should have any additional questions/challenges with affording care, or if she is unable to drive herself to an appointment to let us know at the [place] office so we can assist.

## 2020-10-11 NOTE — Patient Instructions (Addendum)
Start Spiriva 2 puffs daily - call us in 2-4 weeks and let us know if it is helping or not Continue symbicort 2 puffs twice daily (rinse mouth out afterward) Use albuterol inhaler 1-2 puffs as needed every 4-6 hours  Follow up in 2 months with pulmonary function tests

## 2020-10-11 NOTE — Progress Notes (Signed)
Synopsis: Referred by Tamara Fraise, MD for Empysema  Subjective:   PATIENT ID: [name] GENDER: female DOB: 16-Dec-1950, MRN: [id number]   HPI  Chief Complaint  Patient presents with  . Consult    Breathing well. Denies any problems. Needs to established with a lung dr.   [name] is a 69 year old woman, former smoker with history of coronary artery disease, heart failure preserved EF, ESRD on HD, GERD, hypertension and diabetes mellitus type II who is referred to pulmonary clinic for management of COPD.  She reports she has been more short of breath over recent months as she feels she is taking short shallow breaths and unable to take deep breaths. She was admitted 08/31/20 - 09/03/20 at [facility] for COPD exacerbation secondary to rhinovirus along with volume overload. She has not had an exacerbation in her breathing in 3 years. Since discharge she has remained somewhat short of breath with intermittent cough and rare wheezing. She is currently using symbicort 2 puffs twice daily and using albuterol 2-3 times per day.   She does report snoring at night and does not wake up feeling rested.   Past Medical History:  Diagnosis Date  . Acute on chronic diastolic CHF (congestive heart failure) ([place]) 03/05/2015   Grade 2. EF 60-65%  . Anemia    hx of  . Anxiety   . Arthritis   . CAD (coronary artery disease)    s/p Stenting in 2005;  [place] 7/09: Vigorous LV function, 2-3+ MR, RI 40%, RI stent patent, proximal-mid RCA 40-50%;  [place] 7/09:  EF 55-65%, trivial MR;  [place] 11/18/12: EF 66%, normal LV wall motion, mild to moderate anterior ischemia - reviewed by [facility] and felt to rep breast atten (low risk);  Echo 6/14: mild LVH, EF 55-65%, Gr 2 DD, PASP 44, trivial eff   . Chronic kidney disease    CREATININE IS UP--GOING TO KIDNEY MD   . Chronic neck pain   . COPD (chronic obstructive pulmonary disease) ([place])   . Degenerative joint disease    left shoulder  . Diabetic neuropathy  ([place])   . Diverticulosis   . Esophageal stricture   . GERD (gastroesophageal reflux disease)   . Hyperlipidemia   . Hypertension   . IDDM (insulin dependent diabetes mellitus)   . Pericardial effusion 03/06/2015   Very small per echo ; pleural nodules seen on CT chest.  . Peripheral vascular disease ([place])   . Stroke [facility])    "mini- years ago"  . Vitamin D deficiency      Family History  Problem Relation Age of Onset  . Heart disease Other   . Hypertension Father   . Cancer Father   . Hypertension Mother   . Hypertension Daughter   . Pancreatitis Brother   . Hypertension Daughter   . Diabetes Daughter      Social History   Socioeconomic History  . Marital status: Widowed    Spouse name: Not on file  . Number of children: 2  . Years of education: GED  . Highest education level: GED or equivalent  Occupational History  . Occupation: Disabled  Tobacco Use  . Smoking status: Former Smoker    Packs/day: 1.00    Years: 30.00    Pack years: 30.00    Quit date: 12/04/2001    Years since quitting: 18.8  . Smokeless tobacco: Never Used  Vaping Use  . Vaping Use: Never used  Substance and Sexual Activity  .  Alcohol use: No    Alcohol/week: 6.0 standard drinks    Types: 6 Cans of beer per week  . Drug use: No  . Sexual activity: Not Currently    Birth control/protection: Surgical  Other Topics Concern  . Not on file  Social History Narrative   Lives in [place]   Social Determinants of Health   Financial Resource Strain: Low Risk   . Difficulty of Paying Living Expenses: Not hard at all  Food Insecurity: No Food Insecurity  . Worried About Charity fundraiser in the Last Year: Never true  . Ran Out of Food in the Last Year: Never true  Transportation Needs: No Transportation Needs  . Lack of Transportation (Medical): No  . Lack of Transportation (Non-Medical): No  Physical Activity: Inactive  . Days of Exercise per Week: 0 days  . Minutes of Exercise per  Session: 0 min  Stress: No Stress Concern Present  . Feeling of Stress : Not at all  Social Connections: Moderately Integrated  . Frequency of Communication with Friends and Family: More than three times a week  . Frequency of Social Gatherings with Friends and Family: More than three times a week  . Attends Religious Services: More than 4 times per year  . Active Member of Clubs or Organizations: Yes  . Attends Archivist Meetings: More than 4 times per year  . Marital Status: Widowed  Intimate Partner Violence: Not At Risk  . Fear of Current or Ex-Partner: No  . Emotionally Abused: No  . Physically Abused: No  . Sexually Abused: No     Allergies  Allergen Reactions  . Azithromycin     Prolonged QT  . Ace Inhibitors Cough  . Clopidogrel Bisulfate Other (See Comments)    Sick, Headache, "Felt terrible"  . Codeine Other (See Comments)    hallucinations  . Lisinopril Cough  . Penicillins Other (See Comments)    Unknown reaction Did it involve swelling of the face/tongue/throat, SOB, or low BP? Unknown Did it involve sudden or severe rash/hives, skin peeling, or any reaction on the inside of your mouth or nose? Unknown Did you need to seek medical attention at a hospital or doctor's office? Unknown When did it last happen?childhood allergy If all above answers are "NO", may proceed with cephalosporin use.       Outpatient Medications Prior to Visit  Medication Sig Dispense Refill  . acetaminophen (TYLENOL) 500 MG tablet Take 1,000 mg by mouth every 6 (six) hours as needed for moderate pain or headache.    . albuterol (PROVENTIL) (2.5 MG/3ML) 0.083% nebulizer solution Take 3 mLs (2.5 mg total) by nebulization every 6 (six) hours as needed for wheezing or shortness of breath. 75 mL 12  . albuterol (VENTOLIN HFA) 108 (90 Base) MCG/ACT inhaler Inhale 2 puffs into the lungs every 6 (six) hours as needed for wheezing or shortness of breath. 1 each 2  . aspirin 81 MG  EC tablet Take 81 mg by mouth daily.      [name] atenolol (TENORMIN) 50 MG tablet Take 1 tablet (50 mg total) by mouth 2 (two) times daily. 180 tablet 1  . B Complex-C-Folic Acid (DIALYVITE 268) 0.8 MG TABS Take 1 tablet by mouth daily.    . budesonide-formoterol (SYMBICORT) 160-4.5 MCG/ACT inhaler Inhale 2 puffs into the lungs 2 (two) times daily.    . busPIRone (BUSPAR) 7.5 MG tablet Take 1 tablet (7.5 mg total) by mouth 3 (three) times daily as  needed. (Needs to be seen before next refill) 90 tablet 0  . calcium acetate (PHOSLO) 667 MG capsule Take 667-1,334 mg by mouth See admin instructions. Take 1334 mg with meals 3 times daily and 667 mg with snacks    . Carboxymethylcellul-Glycerin (LUBRICATING EYE DROPS OP) Place 1 drop into both eyes daily as needed (dry eyes).    . cetirizine (ZYRTEC) 10 MG tablet Take 1 tablet (10 mg total) by mouth daily as needed (itch). (Patient taking differently: Take 10 mg by mouth daily as needed for allergies. ) 90 tablet 1  . Dulaglutide (TRULICITY) 1.5 OJ/5.0KX SOPN Inject 0.5 mLs (1.5 mg total) into the skin once a week. (Patient taking differently: Inject 1.5 mg into the skin every Friday. ) 2 mL 1  . gabapentin (NEURONTIN) 300 MG capsule Take two after each dialysis (Patient taking differently: Take 600 mg by mouth See admin instructions. Take 600 mg after dialysis on Tues, Thurs, and Sat) 60 capsule 2  . glucose blood (ONETOUCH ULTRA) test strip Use to test blood sugar 3 times daily as directed. DX: E11.9 300 each 12  . insulin glargine (LANTUS SOLOSTAR) 100 UNIT/ML Solostar Pen INJECT  20 UNITS INTO THE SKIN DAILY AT 10 PM. (Patient taking differently: Inject 20 Units into the skin at bedtime. ) 15 mL 4  . NIFEdipine (PROCARDIA XL/NIFEDICAL-XL) 90 MG 24 hr tablet Take 90 mg by mouth daily.     . nitroGLYCERIN (NITROSTAT) 0.4 MG SL tablet Place 1 tablet (0.4 mg total) under the tongue every 5 (five) minutes as needed for chest pain. 25 tablet 11  . ondansetron  (ZOFRAN) 4 MG tablet Take 1 tablet (4 mg total) by mouth every 8 (eight) hours as needed for nausea or vomiting. 20 tablet 3  . pravastatin (PRAVACHOL) 20 MG tablet Take 1 tablet by mouth once daily 90 tablet 0  . torsemide (DEMADEX) 20 MG tablet Take 40 mg by mouth daily.     [name] triamcinolone cream (KENALOG) 0.1 % Apply 1 application topically 3 (three) times daily. Avoid face and genitalia (Patient taking differently: Apply 1 application topically 3 (three) times daily as needed (rash). Avoid face and genitalia) 45 g 0  . hydrALAZINE (APRESOLINE) 50 MG tablet Take 1 tablet (50 mg total) by mouth 2 (two) times daily. 60 tablet 3   No facility-administered medications prior to visit.    Review of Systems  Constitutional: Negative for chills, diaphoresis, fever, malaise/fatigue and weight loss.  HENT: Negative for congestion, sinus pain and sore throat.   Eyes: Negative.   Respiratory: Positive for shortness of breath and wheezing.   Cardiovascular: Negative for chest pain, orthopnea, leg swelling and PND.  Gastrointestinal: Negative for abdominal pain, heartburn, nausea and vomiting.  Musculoskeletal: Negative for joint pain and myalgias.  Skin: Negative for itching and rash.  Neurological: Negative.   Endo/Heme/Allergies: Negative.   Psychiatric/Behavioral: Negative.    Objective:   Vitals:   10/11/20 1608  BP: 134/64  Pulse: 77  Temp: 97.7 F (36.5 C)  TempSrc: Oral  SpO2: 97%  Weight: 185 lb 12.8 oz (84.3 kg)  Height: 5\' 3"  (1.6 m)     Physical Exam Constitutional:      General: She is not in acute distress.    Appearance: Normal appearance. She is obese. She is not ill-appearing.  HENT:     Head: Normocephalic and atraumatic.     Nose: Nose normal.     Mouth/Throat:     Mouth: Mucous membranes  are moist.     Pharynx: Oropharynx is clear.  Eyes:     General: No scleral icterus.    Conjunctiva/sclera: Conjunctivae normal.     Pupils: Pupils are equal, round, and  reactive to light.  Cardiovascular:     Rate and Rhythm: Normal rate and regular rhythm.     Pulses: Normal pulses.     Heart sounds: Normal heart sounds. No murmur heard.   Pulmonary:     Effort: Pulmonary effort is normal.     Breath sounds: Normal breath sounds. No wheezing, rhonchi or rales.  Abdominal:     General: Bowel sounds are normal.     Palpations: Abdomen is soft.  Musculoskeletal:     Cervical back: Neck supple.     Right lower leg: No edema.     Left lower leg: No edema.  Lymphadenopathy:     Cervical: No cervical adenopathy.  Skin:    Capillary Refill: Capillary refill takes less than 2 seconds.     Findings: No lesion or rash.  Neurological:     General: No focal deficit present.     Mental Status: She is alert and oriented to person, place, and time. Mental status is at baseline.  Psychiatric:        Mood and Affect: Mood normal.        Behavior: Behavior normal.        Thought Content: Thought content normal.        Judgment: Judgment normal.     CBC    Component Value Date/Time   WBC 10.4 07/21/2020 1039   WBC 9.2 11/13/2015 1455   RBC 4.37 07/21/2020 1039   RBC 3.84 (L) 11/13/2015 1455   HGB 12.7 07/21/2020 1039   HCT 38.4 07/21/2020 1039   PLT 256 07/21/2020 1039   MCV 88 07/21/2020 1039   MCH 29.1 07/21/2020 1039   MCH 26.6 11/13/2015 1455   MCHC 33.1 07/21/2020 1039   MCHC 32.7 11/13/2015 1455   RDW 14.1 07/21/2020 1039   LYMPHSABS 2.5 07/21/2020 1039   MONOABS 0.5 11/13/2015 1455   EOSABS 0.7 (H) 07/21/2020 1039   BASOSABS 0.1 07/21/2020 1039   BMP Latest Ref Rng & Units 09/15/2020 07/21/2020 04/19/2020  Glucose 65 - 99 mg/dL 116(H) 153(H) 265(H)  BUN 8 - 27 mg/dL 37(H) 47(H) 57(H)  Creatinine 0.57 - 1.00 mg/dL 8.20(HH) 7.87(HH) 8.89(HH)  BUN/Creat Ratio 12 - 28 5(L) 6(L) 6(L)  Sodium 134 - 144 mmol/L 140 141 138  Potassium 3.5 - 5.2 mmol/L 4.2 4.6 4.8  Chloride 96 - 106 mmol/L 94(L) 100 93(L)  CO2 20 - 29 mmol/L 28 26 23   Calcium  8.7 - 10.3 mg/dL 8.2(L) 8.7 8.8   Chest imaging: CXR 09/15/20 Partially visualized surgical hardware from ACDF. Stable cardiomediastinal silhouette with mild cardiomegaly. No pneumothorax. No pleural effusion. Lungs appear clear, with no acute consolidative airspace disease and no pulmonary edema.  CT Chest WO Contrast 05/20/2018 Cardiovascular: Atherosclerotic calcifications are noted within the thoracic aorta and its branches. Coronary calcifications are noted. No aneurysmal dilatation is seen. Mild cardiac enlargement is noted. No pericardial fluid is seen.  Mediastinum/Nodes: Thoracic inlet is within normal limits. Scattered small mediastinal nodes are seen stable in appearance from the prior exam. No sizable lymphadenopathy is identified. No hilar adenopathy is noted. The esophagus as visualized is within normal limits.  Lungs/Pleura: Lungs are well aerated without focal infiltrate, effusion or pneumothorax. Scattered small pulmonary nodules are noted throughout both lungs and are  stable from the prior CT of the chest abdomen and pelvis from 2018. Review of a previous CT of the chest from 12/15/2015 also shows these nodules to be stable and consistent with a benign etiology. No further follow-up is Necessary.  PFT: PFT Results Latest Ref Rng & Units 03/17/2015  FVC-Pre L 2.02  FVC-Predicted Pre % 85  FVC-Post L 2.03  FVC-Predicted Post % 85  Pre FEV1/FVC % % 84  Post FEV1/FCV % % 85  FEV1-Pre L 1.70  FEV1-Predicted Pre % 92  FEV1-Post L 1.74  DLCO uncorrected ml/min/mmHg 13.62  DLCO [facility]% % 63  DLVA Predicted % 94    Echo: 2016 - Left ventricle: The cavity size was normal. Wall thickness was  increased in a pattern of mild LVH. Systolic function was normal.  The estimated ejection fraction was in the range of 60% to 65%.  Wall motion was normal; there were no regional wall motion  abnormalities. Doppler parameters are consistent with restrictive   physiology, indicative of decreased left ventricular diastolic  compliance and/or increased left atrial pressure.  - Mitral valve: There was mild regurgitation.  - Left atrium: The atrium was severely dilated.  - Right atrium: Central venous pressure (est): 3 mm Hg.  - Tricuspid valve: There was trivial regurgitation.  - Pulmonary arteries: Systolic pressure could not be accurately  estimated.  - Pericardium, extracardiac: A small pericardial effusion was  identified circumferential to the heart.  Heart Catheterization:   Assessment & Plan:   Dyspnea, unspecified type - Plan: Pulmonary Function Test  Sleep apnea, unspecified type - Plan: Home sleep test  Discussion: [name] is a 69 year old woman, former smoker with history of coronary artery disease, heart failure preserved EF, ESRD on HD, GERD, hypertension and diabetes mellitus type II who is referred to pulmonary clinic for management of COPD.  She continues to experience shortness of breath and cough with frequent use of her albuterol rescue inhaler despite being on symbicort 160-4.33mcg 2 puffs twice daily. We will add spiriva 1.68mcg 2 puffs once daily and monitor for symptom improvement. Should she notice benefit from the addition of LAMA therapy then we will continue her on this.  Given her history of snoring, obesity and fatigue we will schedule her for a home sleep study.   She is to follow up in 2 months with pulmonary function testing.  Freda Jackson, MD [facility] Office: [phone]    Current Outpatient Medications:  .  acetaminophen (TYLENOL) 500 MG tablet, Take 1,000 mg by mouth every 6 (six) hours as needed for moderate pain or headache., Disp: , Rfl:  .  albuterol (PROVENTIL) (2.5 MG/3ML) 0.083% nebulizer solution, Take 3 mLs (2.5 mg total) by nebulization every 6 (six) hours as needed for wheezing or shortness of breath., Disp: 75 mL, Rfl: 12 .  albuterol (VENTOLIN  HFA) 108 (90 Base) MCG/ACT inhaler, Inhale 2 puffs into the lungs every 6 (six) hours as needed for wheezing or shortness of breath., Disp: 1 each, Rfl: 2 .  aspirin 81 MG EC tablet, Take 81 mg by mouth daily.  , Disp: , Rfl:  .  atenolol (TENORMIN) 50 MG tablet, Take 1 tablet (50 mg total) by mouth 2 (two) times daily., Disp: 180 tablet, Rfl: 1 .  B Complex-C-Folic Acid (DIALYVITE 756) 0.8 MG TABS, Take 1 tablet by mouth daily., Disp: , Rfl:  .  budesonide-formoterol (SYMBICORT) 160-4.5 MCG/ACT inhaler, Inhale 2 puffs into the lungs 2 (two) times  daily., Disp: , Rfl:  .  busPIRone (BUSPAR) 7.5 MG tablet, Take 1 tablet (7.5 mg total) by mouth 3 (three) times daily as needed. (Needs to be seen before next refill), Disp: 90 tablet, Rfl: 0 .  calcium acetate (PHOSLO) 667 MG capsule, Take 667-1,334 mg by mouth See admin instructions. Take 1334 mg with meals 3 times daily and 667 mg with snacks, Disp: , Rfl:  .  Carboxymethylcellul-Glycerin (LUBRICATING EYE DROPS OP), Place 1 drop into both eyes daily as needed (dry eyes)., Disp: , Rfl:  .  cetirizine (ZYRTEC) 10 MG tablet, Take 1 tablet (10 mg total) by mouth daily as needed (itch). (Patient taking differently: Take 10 mg by mouth daily as needed for allergies. ), Disp: 90 tablet, Rfl: 1 .  Dulaglutide (TRULICITY) 1.5 OV/5.6EP SOPN, Inject 0.5 mLs (1.5 mg total) into the skin once a week. (Patient taking differently: Inject 1.5 mg into the skin every Friday. ), Disp: 2 mL, Rfl: 1 .  gabapentin (NEURONTIN) 300 MG capsule, Take two after each dialysis (Patient taking differently: Take 600 mg by mouth See admin instructions. Take 600 mg after dialysis on Tues, Thurs, and Sat), Disp: 60 capsule, Rfl: 2 .  glucose blood (ONETOUCH ULTRA) test strip, Use to test blood sugar 3 times daily as directed. DX: E11.9, Disp: 300 each, Rfl: 12 .  insulin glargine (LANTUS SOLOSTAR) 100 UNIT/ML Solostar Pen, INJECT  20 UNITS INTO THE SKIN DAILY AT 10 PM. (Patient taking  differently: Inject 20 Units into the skin at bedtime. ), Disp: 15 mL, Rfl: 4 .  NIFEdipine (PROCARDIA XL/NIFEDICAL-XL) 90 MG 24 hr tablet, Take 90 mg by mouth daily. , Disp: , Rfl:  .  nitroGLYCERIN (NITROSTAT) 0.4 MG SL tablet, Place 1 tablet (0.4 mg total) under the tongue every 5 (five) minutes as needed for chest pain., Disp: 25 tablet, Rfl: 11 .  ondansetron (ZOFRAN) 4 MG tablet, Take 1 tablet (4 mg total) by mouth every 8 (eight) hours as needed for nausea or vomiting., Disp: 20 tablet, Rfl: 3 .  pravastatin (PRAVACHOL) 20 MG tablet, Take 1 tablet by mouth once daily, Disp: 90 tablet, Rfl: 0 .  torsemide (DEMADEX) 20 MG tablet, Take 40 mg by mouth daily. , Disp: , Rfl:  .  triamcinolone cream (KENALOG) 0.1 %, Apply 1 application topically 3 (three) times daily. Avoid face and genitalia (Patient taking differently: Apply 1 application topically 3 (three) times daily as needed (rash). Avoid face and genitalia), Disp: 45 g, Rfl: 0 .  hydrALAZINE (APRESOLINE) 50 MG tablet, Take 1 tablet (50 mg total) by mouth 2 (two) times daily., Disp: 60 tablet, Rfl: 3 .  Tiotropium Bromide Monohydrate (SPIRIVA RESPIMAT) 1.25 MCG/ACT AERS, Inhale 2 puffs into the lungs daily., Disp: 4 g, Rfl: 0

## 2020-10-13 ENCOUNTER — Ambulatory Visit: Payer: Medicare Other | Admitting: Vascular Surgery

## 2020-10-13 ENCOUNTER — Encounter (HOSPITAL_COMMUNITY): Payer: Medicare Other

## 2020-10-15 ENCOUNTER — Telehealth (HOSPITAL_COMMUNITY): Payer: Self-pay | Admitting: *Deleted

## 2020-10-15 NOTE — Telephone Encounter (Signed)
Close encounter 

## 2020-10-18 ENCOUNTER — Telehealth: Payer: Medicare Other | Admitting: *Deleted

## 2020-10-20 ENCOUNTER — Encounter: Payer: Medicare Other | Admitting: Vascular Surgery

## 2020-10-20 ENCOUNTER — Ambulatory Visit: Payer: Medicare Other | Admitting: Vascular Surgery

## 2020-10-20 ENCOUNTER — Encounter (HOSPITAL_COMMUNITY): Payer: Medicare Other

## 2020-10-20 ENCOUNTER — Other Ambulatory Visit: Payer: Self-pay

## 2020-10-20 ENCOUNTER — Ambulatory Visit (HOSPITAL_COMMUNITY)
Admission: RE | Admit: 2020-10-20 | Discharge: 2020-10-20 | Disposition: A | Discharge status: Home / Self Care | Payer: Medicare Other | Source: Ambulatory Visit | Attending: Cardiology | Admitting: Cardiology

## 2020-10-20 DIAGNOSIS — I5032 Chronic diastolic (congestive) heart failure: Secondary | ICD-10-CM | POA: Diagnosis not present

## 2020-10-20 DIAGNOSIS — I2510 Atherosclerotic heart disease of native coronary artery without angina pectoris: Secondary | ICD-10-CM | POA: Diagnosis not present

## 2020-10-20 LAB — MYOCARDIAL PERFUSION IMAGING
LV dias vol: 111 mL (ref 46–106)
LV sys vol: 50 mL
Peak HR: 93 {beats}/min
Rest HR: 74 {beats}/min
SDS: 0
SRS: 0
SSS: 0
TID: 0.98

## 2020-10-20 MED ORDER — AMINOPHYLLINE 25 MG/ML IV SOLN
75.0000 mg | Freq: Once | INTRAVENOUS | Status: AC
  Administered 2020-10-20: 75 mg via INTRAVENOUS

## 2020-10-20 MED ORDER — TECHNETIUM TC 99M TETROFOSMIN IV KIT
30.9000 | PACK | Freq: Once | INTRAVENOUS | Status: AC | PRN
  Administered 2020-10-20: 30.9 via INTRAVENOUS
  Filled 2020-10-20: qty 31

## 2020-10-20 MED ORDER — REGADENOSON 0.4 MG/5ML IV SOLN
0.4000 mg | Freq: Once | INTRAVENOUS | Status: AC
  Administered 2020-10-20: 0.4 mg via INTRAVENOUS

## 2020-10-20 MED ORDER — TECHNETIUM TC 99M TETROFOSMIN IV KIT
10.5000 | PACK | Freq: Once | INTRAVENOUS | Status: AC | PRN
  Administered 2020-10-20: 10.5 via INTRAVENOUS
  Filled 2020-10-20: qty 11

## 2020-10-25 ENCOUNTER — Telehealth: Payer: Self-pay

## 2020-10-25 MED ORDER — BUDESONIDE-FORMOTEROL FUMARATE 160-4.5 MCG/ACT IN AERO: 2.0000 | INHALATION_SPRAY | Freq: Two times a day (BID) | RESPIRATORY_TRACT | 11 refills | Status: DC

## 2020-10-26 DIAGNOSIS — Z992 Dependence on renal dialysis: Secondary | ICD-10-CM | POA: Diagnosis not present

## 2020-10-27 NOTE — Telephone Encounter (Signed)
Call placed to [facility]&me symbicort will be delivered to PCP office in 7-10 business days  Patient verbalizes understanding

## 2020-11-01 ENCOUNTER — Telehealth: Payer: Self-pay

## 2020-11-01 ENCOUNTER — Ambulatory Visit (INDEPENDENT_AMBULATORY_CARE_PROVIDER_SITE_OTHER): Payer: Medicare Other | Admitting: Family Medicine

## 2020-11-01 ENCOUNTER — Encounter: Payer: Self-pay | Admitting: Family Medicine

## 2020-11-01 ENCOUNTER — Other Ambulatory Visit: Payer: Self-pay

## 2020-11-01 VITALS — BP 126/71 | HR 96 | Temp 97.3°F | Ht 63.0 in | Wt 182.6 lb

## 2020-11-01 DIAGNOSIS — E785 Hyperlipidemia, unspecified: Secondary | ICD-10-CM

## 2020-11-01 DIAGNOSIS — E119 Type 2 diabetes mellitus without complications: Secondary | ICD-10-CM

## 2020-11-01 DIAGNOSIS — I2510 Atherosclerotic heart disease of native coronary artery without angina pectoris: Secondary | ICD-10-CM

## 2020-11-01 DIAGNOSIS — F411 Generalized anxiety disorder: Secondary | ICD-10-CM | POA: Diagnosis not present

## 2020-11-01 DIAGNOSIS — E1169 Type 2 diabetes mellitus with other specified complication: Secondary | ICD-10-CM

## 2020-11-01 DIAGNOSIS — E1159 Type 2 diabetes mellitus with other circulatory complications: Secondary | ICD-10-CM | POA: Diagnosis not present

## 2020-11-01 DIAGNOSIS — I152 Hypertension secondary to endocrine disorders: Secondary | ICD-10-CM | POA: Diagnosis not present

## 2020-11-01 DIAGNOSIS — K5903 Drug induced constipation: Secondary | ICD-10-CM

## 2020-11-01 LAB — BAYER DCA HB A1C WAIVED: HB A1C (BAYER DCA - WAIVED): 6.2 % (ref <7.0)

## 2020-11-01 MED ORDER — LINACLOTIDE 72 MCG PO CAPS: ORAL_CAPSULE | ORAL | 2 refills | Status: DC

## 2020-11-01 MED ORDER — ATENOLOL 50 MG PO TABS: 50.0000 mg | ORAL_TABLET | Freq: Two times a day (BID) | ORAL | 1 refills | Status: DC

## 2020-11-01 MED ORDER — QUETIAPINE FUMARATE 25 MG PO TABS: 25.0000 mg | ORAL_TABLET | Freq: Every day | ORAL | 1 refills | Status: DC

## 2020-11-01 MED ORDER — TRULICITY 1.5 MG/0.5ML ~~LOC~~ SOAJ: 1.5000 mg | SUBCUTANEOUS | 1 refills | Status: DC

## 2020-11-01 MED ORDER — PRAVASTATIN SODIUM 20 MG PO TABS: 20.0000 mg | ORAL_TABLET | Freq: Every day | ORAL | 0 refills | Status: DC

## 2020-11-01 MED ORDER — GABAPENTIN 300 MG PO CAPS
ORAL_CAPSULE | ORAL | 2 refills | Status: DC
Start: 2020-11-01 — End: 2020-12-27

## 2020-11-01 MED ORDER — CETIRIZINE HCL 10 MG PO TABS
10.0000 mg | ORAL_TABLET | Freq: Every day | ORAL | 3 refills | Status: DC | PRN
Start: 2020-11-01 — End: 2021-09-14

## 2020-11-01 NOTE — Telephone Encounter (Signed)
Pt called to let Almyra Free know that her Linzess Rx was $75 for a 30 day supply with her insurance. Says she went ahead and purchases the Rx this time but wont be able to continue to do that because she cant afford that every month. Wants to know what her options are?

## 2020-11-01 NOTE — Telephone Encounter (Signed)
Called returned to patient symbicort shipment has arrived There is no assistance for linzess Patient verbalizes understanding

## 2020-11-02 DIAGNOSIS — Z992 Dependence on renal dialysis: Secondary | ICD-10-CM | POA: Diagnosis not present

## 2020-11-02 DIAGNOSIS — N186 End stage renal disease: Secondary | ICD-10-CM | POA: Diagnosis not present

## 2020-11-02 LAB — CMP14+EGFR
ALT: 13 IU/L (ref 0–32)
AST: 10 IU/L (ref 0–40)
Albumin/Globulin Ratio: 2 (ref 1.2–2.2)
Albumin: 4.3 g/dL (ref 3.8–4.8)
Alkaline Phosphatase: 123 IU/L — ABNORMAL HIGH (ref 44–121)
BUN/Creatinine Ratio: 4 — ABNORMAL LOW (ref 12–28)
BUN: 46 mg/dL — ABNORMAL HIGH (ref 8–27)
Bilirubin Total: 0.2 mg/dL (ref 0.0–1.2)
CO2: 24 mmol/L (ref 20–29)
Calcium: 9.6 mg/dL (ref 8.7–10.3)
Chloride: 94 mmol/L — ABNORMAL LOW (ref 96–106)
Creatinine, Ser: 10.47 mg/dL (ref 0.57–1.00)
GFR calc Af Amer: 4 mL/min/{1.73_m2} — ABNORMAL LOW (ref >59)
GFR calc non Af Amer: 3 mL/min/{1.73_m2} — ABNORMAL LOW (ref >59)
Globulin, Total: 2.2 g/dL (ref 1.5–4.5)
Glucose: 121 mg/dL — ABNORMAL HIGH (ref 65–99)
Potassium: 4.5 mmol/L (ref 3.5–5.2)
Sodium: 140 mmol/L (ref 134–144)
Total Protein: 6.5 g/dL (ref 6.0–8.5)

## 2020-11-02 LAB — CBC WITH DIFFERENTIAL/PLATELET
Basophils Absolute: 0.1 10*3/uL (ref 0.0–0.2)
Basos: 1 %
EOS (ABSOLUTE): 0.7 10*3/uL — ABNORMAL HIGH (ref 0.0–0.4)
Eos: 5 %
Hematocrit: 36.7 % (ref 34.0–46.6)
Hemoglobin: 11.8 g/dL (ref 11.1–15.9)
Immature Grans (Abs): 0 10*3/uL (ref 0.0–0.1)
Immature Granulocytes: 0 %
Lymphocytes Absolute: 2.9 10*3/uL (ref 0.7–3.1)
Lymphs: 23 %
MCH: 28.9 pg (ref 26.6–33.0)
MCHC: 32.2 g/dL (ref 31.5–35.7)
MCV: 90 fL (ref 79–97)
Monocytes Absolute: 0.8 10*3/uL (ref 0.1–0.9)
Monocytes: 6 %
Neutrophils Absolute: 8.4 10*3/uL — ABNORMAL HIGH (ref 1.4–7.0)
Neutrophils: 65 %
Platelets: 271 10*3/uL (ref 150–450)
RBC: 4.08 x10E6/uL (ref 3.77–5.28)
RDW: 13.9 % (ref 11.7–15.4)
WBC: 12.9 10*3/uL — ABNORMAL HIGH (ref 3.4–10.8)

## 2020-11-02 LAB — LIPID PANEL
Chol/HDL Ratio: 4.7 ratio — ABNORMAL HIGH (ref 0.0–4.4)
Cholesterol, Total: 175 mg/dL (ref 100–199)
HDL: 37 mg/dL — ABNORMAL LOW (ref >39)
LDL Chol Calc (NIH): 108 mg/dL — ABNORMAL HIGH (ref 0–99)
Triglycerides: 169 mg/dL — ABNORMAL HIGH (ref 0–149)
VLDL Cholesterol Cal: 30 mg/dL (ref 5–40)

## 2020-11-03 ENCOUNTER — Other Ambulatory Visit: Payer: Self-pay

## 2020-11-03 ENCOUNTER — Ambulatory Visit (INDEPENDENT_AMBULATORY_CARE_PROVIDER_SITE_OTHER): Payer: Medicare Other

## 2020-11-03 ENCOUNTER — Telehealth: Payer: Self-pay | Admitting: Pulmonary Disease

## 2020-11-03 DIAGNOSIS — Z23 Encounter for immunization: Secondary | ICD-10-CM

## 2020-11-03 NOTE — Progress Notes (Signed)
   Covid-19 Vaccination Clinic  Name:  ELESHIA WOOLEY    MRN: [id number] DOB: 01/29/1951  11/03/2020  Ms. Copeman was observed post Covid-19 immunization for 15 minutes without incident. She was provided with Vaccine Information Sheet and instruction to access the V-Safe system.   Ms. Frasier was instructed to call 911 with any severe reactions post vaccine: [name] Difficulty breathing  . Swelling of face and throat  . A fast heartbeat  . A bad rash all over body  . Dizziness and weakness   Immunizations Administered    No immunizations on file.

## 2020-11-04 ENCOUNTER — Encounter: Payer: Self-pay | Admitting: Family Medicine

## 2020-11-04 DIAGNOSIS — D631 Anemia in chronic kidney disease: Secondary | ICD-10-CM | POA: Diagnosis not present

## 2020-11-04 DIAGNOSIS — N2581 Secondary hyperparathyroidism of renal origin: Secondary | ICD-10-CM | POA: Diagnosis not present

## 2020-11-04 DIAGNOSIS — Z992 Dependence on renal dialysis: Secondary | ICD-10-CM | POA: Diagnosis not present

## 2020-11-04 DIAGNOSIS — N186 End stage renal disease: Secondary | ICD-10-CM | POA: Diagnosis not present

## 2020-11-04 DIAGNOSIS — D509 Iron deficiency anemia, unspecified: Secondary | ICD-10-CM | POA: Diagnosis not present

## 2020-11-04 MED ORDER — SPIRIVA RESPIMAT 1.25 MCG/ACT IN AERS: 2.0000 | INHALATION_SPRAY | Freq: Every day | RESPIRATORY_TRACT | 6 refills | Status: DC

## 2020-11-04 NOTE — Telephone Encounter (Signed)
Per Dr. Erin Fulling-  yes, ok to send in script for spiriva  Called and spoke with patient to let her know that we were sending RX into pharmacy. She verified pharmacy. RX sent. Nothing further needed at this time.

## 2020-11-04 NOTE — Telephone Encounter (Signed)
Pt returning missed call. [phone]

## 2020-11-04 NOTE — Telephone Encounter (Signed)
ATC patient unable to reach left message on machine.   Dr. Erin Fulling are you okay with sending in RX for Spiriva 1.25 when we get ahold of patient as she previously states she liked the medication when using the sample.

## 2020-11-04 NOTE — Progress Notes (Signed)
Subjective:  Patient ID: [name], female    DOB: Oct 07, 1951  Age: 69 y.o. MRN: [id number]  CC: Follow-up (3 Month)   HPI [name] presents forFollow-up of diabetes. Patient checks blood sugar at home.  Patient denies symptoms such as polyuria, polydipsia, excessive hunger, nausea No significant hypoglycemic spells noted. Medications reviewed. Pt reports taking them regularly without complication/adverse reaction being reported today.   Pt. Had myocardial perfusion test recently. Report reviewed. LVEF noted in normal range.    History Rickesha has a past medical history of Acute on chronic diastolic CHF (congestive heart failure) ([place]) (03/05/2015), Anemia, Anxiety, Arthritis, CAD (coronary artery disease), Chronic kidney disease, Chronic neck pain, COPD (chronic obstructive pulmonary disease) ([place]), Degenerative joint disease, Diabetic neuropathy ([place]), Diverticulosis, Esophageal stricture, GERD (gastroesophageal reflux disease), Hyperlipidemia, Hypertension, IDDM (insulin dependent diabetes mellitus), Pericardial effusion (03/06/2015), Peripheral vascular disease ([place]), Stroke ([place]), and Vitamin D deficiency.   She has a past surgical history that includes Bladder surgery; Partial hysterectomy; Shoulder surgery (Right); Cervical spine surgery (2012); Abdominal hysterectomy; Laminectomy (12/29/2011); cardiac stents; Lumbar laminectomy/decompression microdiscectomy (12/29/2011); Back surgery; Cystoscopy with injection (N/A, 06/03/2013); Cardiac catheterization (2003, 2005, 2009); Cataract extraction w/PHACO (Right, 05/04/2014); Cataract extraction w/PHACO (Left, 07/20/2014); AV fistula placement (Right, 07/20/2015); Cardiac catheterization (N/A, 11/22/2015); Insertion of dialysis catheter (N/A, 11/26/2015); AV fistula placement (Right, 11/26/2015); Fistulogram (Right, 10/20/2016); A/V Fistulagram (N/A, 08/10/2017); PERIPHERAL VASCULAR INTERVENTION (08/10/2017); A/V Fistulagram (N/A, 03/03/2020); and  PERIPHERAL VASCULAR BALLOON ANGIOPLASTY (03/03/2020).   Her family history includes Cancer in her father; Diabetes in her daughter; Heart disease in an other family member; Hypertension in her daughter, daughter, father, and mother; Pancreatitis in her brother.She reports that she quit smoking about 18 years ago. She has a 30.00 pack-year smoking history. She has never used smokeless tobacco. She reports that she does not drink alcohol and does not use drugs.  Current Outpatient Medications on File Prior to Visit  Medication Sig Dispense Refill  . acetaminophen (TYLENOL) 500 MG tablet Take 1,000 mg by mouth every 6 (six) hours as needed for moderate pain or headache.    . albuterol (PROVENTIL) (2.5 MG/3ML) 0.083% nebulizer solution Take 3 mLs (2.5 mg total) by nebulization every 6 (six) hours as needed for wheezing or shortness of breath. 75 mL 12  . albuterol (VENTOLIN HFA) 108 (90 Base) MCG/ACT inhaler Inhale 2 puffs into the lungs every 6 (six) hours as needed for wheezing or shortness of breath. 1 each 2  . aspirin 81 MG EC tablet Take 81 mg by mouth daily.      . B Complex-C-Folic Acid (DIALYVITE 025) 0.8 MG TABS Take 1 tablet by mouth daily.    . budesonide-formoterol (SYMBICORT) 160-4.5 MCG/ACT inhaler Inhale 2 puffs into the lungs 2 (two) times daily. 1 each 11  . busPIRone (BUSPAR) 7.5 MG tablet Take 1 tablet (7.5 mg total) by mouth 3 (three) times daily as needed. (Needs to be seen before next refill) 90 tablet 0  . calcium acetate (PHOSLO) 667 MG capsule Take 667-1,334 mg by mouth See admin instructions. Take 1334 mg with meals 3 times daily and 667 mg with snacks    . Carboxymethylcellul-Glycerin (LUBRICATING EYE DROPS OP) Place 1 drop into both eyes daily as needed (dry eyes).    [name] glucose blood (ONETOUCH ULTRA) test strip Use to test blood sugar 3 times daily as directed. DX: E11.9 300 each 12  . Insulin Glargine (BASAGLAR KWIKPEN) 100 UNIT/ML Inject 20 Units into the skin at bedtime.      [name]  NIFEdipine (PROCARDIA XL/NIFEDICAL-XL) 90 MG 24 hr tablet Take 90 mg by mouth daily.     . nitroGLYCERIN (NITROSTAT) 0.4 MG SL tablet Place 1 tablet (0.4 mg total) under the tongue every 5 (five) minutes as needed for chest pain. 25 tablet 11  . ondansetron (ZOFRAN) 4 MG tablet Take 1 tablet (4 mg total) by mouth every 8 (eight) hours as needed for nausea or vomiting. 20 tablet 3  . torsemide (DEMADEX) 20 MG tablet Take 40 mg by mouth daily.     [name] triamcinolone cream (KENALOG) 0.1 % Apply 1 application topically 3 (three) times daily. Avoid face and genitalia (Patient taking differently: Apply 1 application topically 3 (three) times daily as needed (rash). Avoid face and genitalia) 45 g 0  . hydrALAZINE (APRESOLINE) 50 MG tablet Take 1 tablet (50 mg total) by mouth 2 (two) times daily. 60 tablet 3   No current facility-administered medications on file prior to visit.    ROS Review of Systems  Constitutional: Negative.   HENT: Negative.   Eyes: Negative for visual disturbance.  Respiratory: Negative for shortness of breath.   Cardiovascular: Negative for chest pain.  Gastrointestinal: Negative for abdominal pain.  Musculoskeletal: Negative for arthralgias.    Objective:  BP 126/71   Pulse 96   Temp (!) 97.3 F (36.3 C) (Temporal)   Ht _0  (1.6 m)   Wt 182 lb 9.6 oz (82.8 kg)   BMI 32.35 kg/m   BP Readings from Last 3 Encounters:  11/01/20 126/71  10/11/20 134/64  10/08/20 (!) 124/52    Wt Readings from Last 3 Encounters:  11/01/20 182 lb 9.6 oz (82.8 kg)  10/20/20 185 lb (83.9 kg)  10/11/20 185 lb 12.8 oz (84.3 kg)     Physical Exam Constitutional:      General: She is not in acute distress.    Appearance: She is well-developed.  HENT:     Head: Normocephalic and atraumatic.  Eyes:     Conjunctiva/sclera: Conjunctivae normal.     Pupils: Pupils are equal, round, and reactive to light.  Neck:     Thyroid: No thyromegaly.  Cardiovascular:     Rate and  Rhythm: Normal rate and regular rhythm.     Heart sounds: Normal heart sounds. No murmur heard.   Pulmonary:     Effort: Pulmonary effort is normal. No respiratory distress.     Breath sounds: Normal breath sounds. No wheezing or rales.  Abdominal:     General: Bowel sounds are normal. There is no distension.     Palpations: Abdomen is soft.     Tenderness: There is no abdominal tenderness.  Musculoskeletal:        General: Normal range of motion.     Cervical back: Normal range of motion and neck supple.  Lymphadenopathy:     Cervical: No cervical adenopathy.  Skin:    General: Skin is warm and dry.  Neurological:     Mental Status: She is alert and oriented to person, place, and time.  Psychiatric:        Behavior: Behavior normal.        Thought Content: Thought content normal.        Judgment: Judgment normal.       Assessment & Plan:   Marquasia was seen today for follow-up.  Diagnoses and all orders for this visit:  Type 2 diabetes mellitus without complication, without long-term current use of insulin ([place]) -     [facility]  Hb A1c Waived -     CBC with Differential/Platelet -     CMP14+EGFR -     Lipid panel -     Dulaglutide (TRULICITY) 1.5 EB/3.4DH SOPN; Inject 1.5 mg into the skin once a week.  Hyperlipidemia associated with type 2 diabetes mellitus (HCC) -     [facility] DCA Hb A1c Waived -     CBC with Differential/Platelet -     CMP14+EGFR -     Lipid panel -     pravastatin (PRAVACHOL) 20 MG tablet; Take 1 tablet (20 mg total) by mouth daily.  GAD (generalized anxiety disorder) -     [facility] DCA Hb A1c Waived -     CBC with Differential/Platelet -     CMP14+EGFR -     Lipid panel -     QUEtiapine (SEROQUEL) 25 MG tablet; Take 1 tablet (25 mg total) by mouth at bedtime. For sleep and for anxiety  Hypertension associated with diabetes ([place]) -     [facility] DCA Hb A1c Waived -     CBC with Differential/Platelet -     CMP14+EGFR -     Lipid panel -     atenolol  (TENORMIN) 50 MG tablet; Take 1 tablet (50 mg total) by mouth 2 (two) times daily.  Drug-induced constipation -     linaclotide (LINZESS) 72 MCG capsule; One daily on days you do NOT have dialysis  Other orders -     gabapentin (NEURONTIN) 300 MG capsule; Take two after each dialysis -     cetirizine (ZYRTEC) 10 MG tablet; Take 1 tablet (10 mg total) by mouth daily as needed for allergies.      I have changed Cooper Render. Barco's pravastatin, Trulicity, and cetirizine. I am also having her start on QUEtiapine and linaclotide. Additionally, I am having her maintain her aspirin, calcium acetate, torsemide, hydrALAZINE, nitroGLYCERIN, acetaminophen, Dialyvite 800, NIFEdipine, triamcinolone, ondansetron, Carboxymethylcellul-Glycerin (LUBRICATING EYE DROPS OP), albuterol, albuterol, OneTouch Ultra, busPIRone, Basaglar KwikPen, budesonide-formoterol, gabapentin, and atenolol.  Meds ordered this encounter  Medications  . pravastatin (PRAVACHOL) 20 MG tablet    Sig: Take 1 tablet (20 mg total) by mouth daily.    Dispense:  90 tablet    Refill:  0  . gabapentin (NEURONTIN) 300 MG capsule    Sig: Take two after each dialysis    Dispense:  60 capsule    Refill:  2    Replaces previous sccrip for one after dialysis  . Dulaglutide (TRULICITY) 1.5 WY/6.1UO SOPN    Sig: Inject 1.5 mg into the skin once a week.    Dispense:  2 mL    Refill:  1  . cetirizine (ZYRTEC) 10 MG tablet    Sig: Take 1 tablet (10 mg total) by mouth daily as needed for allergies.    Dispense:  90 tablet    Refill:  3  . atenolol (TENORMIN) 50 MG tablet    Sig: Take 1 tablet (50 mg total) by mouth 2 (two) times daily.    Dispense:  180 tablet    Refill:  1  . QUEtiapine (SEROQUEL) 25 MG tablet    Sig: Take 1 tablet (25 mg total) by mouth at bedtime. For sleep and for anxiety    Dispense:  90 tablet    Refill:  1  . linaclotide (LINZESS) 72 MCG capsule    Sig: One daily on days you do NOT have dialysis    Dispense:  30  capsule  Refill:  2     Follow-up: No follow-ups on file.  [name], M.D.

## 2020-11-05 ENCOUNTER — Telehealth: Payer: Self-pay | Admitting: Family Medicine

## 2020-11-05 NOTE — Telephone Encounter (Signed)
Pt needs to talk about the cost of a prescription

## 2020-11-10 ENCOUNTER — Telehealth: Payer: Self-pay | Admitting: Pulmonary Disease

## 2020-11-12 NOTE — Telephone Encounter (Signed)
I left pt a VM on 12/3 to call be back to schedule an appt w/ me to pick up the HST machine.    I called patient back & patient requested an appt in January so I scheduled her for 12/10/20 @ 2:30.  Pt stated nothing further needed at this time.

## 2020-11-12 NOTE — Telephone Encounter (Signed)
Spoke to pt she has pfts 12/13/20 and OV 12/15/20 wants to do the HST couple of weeks before that so she will have results when Dr Erin Fulling see's her back Earnest Bailey has this HST order she will call pt and set this up Joellen Jersey

## 2020-11-16 NOTE — Progress Notes (Signed)
Cardiology Office Note   Date:  11/17/2020   ID:  GIANNIE SOLIDAY, DOB 1951/01/17, MRN [id number]  PCP:  [name], MD  Cardiologist:   No primary care provider on file.   No chief complaint on file.     History of Present Illness: [name] is a 69 y.o. female who presents for follow up of CAD and diastolic heart failure with diastolic dysfunction.   She was in the hospital in September at [facility].  I reviewed these records in detail.  She was there for respiratory failure and was said to have a COPD exacerbation prompted by a non-Covid viral illness.  However, there was also suggestion of acute on chronic diastolic heart failure.  Her volume is typically managed by dialysis and she did have to have angioplasty of her fistula to proceed with dialysis.  She said her initial weight was 84 kg and her dry weight now is 81 kg.  I did review the records and found that she had an echocardiogram which suggested that her ejection fraction was slightly reduced from previous at 45%.  At the last visit she was reporting chest pain with dialysis.  However, there was no ischemia when I sent her for a perfusion study.     She went and saw the pulmonologist and had PFTs and sleep study ordered.  These are pending.  She was started on Spiriva and she thinks this improved things.  She is not getting the discomfort like she was having with dialysis.  She was getting a lot of cramping when they pull too much fluid.  She is not having any new shortness of breath, PND or orthopnea.  She said no chest pressure, neck or arm discomfort.  Past Medical History:  Diagnosis Date  . Acute on chronic diastolic CHF (congestive heart failure) ([place]) 03/05/2015   Grade 2. EF 60-65%  . Anemia    hx of  . Anxiety   . Arthritis   . CAD (coronary artery disease)    s/p Stenting in 2005;  [place] 7/09: Vigorous LV function, 2-3+ MR, RI 40%, RI stent patent, proximal-mid RCA 40-50%;  Echo 7/09:  EF 55-65%, trivial MR;   Myov[place] 11/18/12: EF 66%, normal LV wall motion, mild to moderate anterior ischemia - reviewed by [facility] and felt to rep breast atten (low risk);  Echo 6/14: mild LVH, EF 55-65%, Gr 2 DD, PASP 44, trivial eff   . Chronic kidney disease    CREATININE IS UP--GOING TO KIDNEY MD   . Chronic neck pain   . COPD (chronic obstructive pulmonary disease) ([place])   . Degenerative joint disease    left shoulder  . Diabetic neuropathy ([place])   . Diverticulosis   . Esophageal stricture   . GERD (gastroesophageal reflux disease)   . Hyperlipidemia   . Hypertension   . IDDM (insulin dependent diabetes mellitus)   . Pericardial effusion 03/06/2015   Very small per echo ; pleural nodules seen on CT chest.  . Peripheral vascular disease ([place])   . Stroke [facility])    "mini- years ago"  . Vitamin D deficiency     Past Surgical History:  Procedure Laterality Date  . A/V FISTULAGRAM N/A 08/10/2017   Procedure: A/V Fistulagram - Right Arm;  Surgeon: Elam Dutch, MD;  Location: [place] CV LAB;  Service: Cardiovascular;  Laterality: N/A;  . A/V FISTULAGRAM N/A 03/03/2020   Procedure: A/V FISTULAGRAM - Right Arm;  Surgeon: Marty Heck,  MD;  Location: [place] CV LAB;  Service: Cardiovascular;  Laterality: N/A;  . ABDOMINAL HYSTERECTOMY    . AV FISTULA PLACEMENT Right 07/20/2015   Procedure: RADIAL CEPHALIC ARTERIOVENOUS FISTULA CREATION RIGHT ARM;  Surgeon: Angelia Mould, MD;  Location: [place];  Service: Vascular;  Laterality: Right;  . AV FISTULA PLACEMENT Right 11/26/2015   Procedure:  Creation of RIGHT BRACHIO-CEPHALIC arteriovenous fistula;  Surgeon: Angelia Mould, MD;  Location: [place];  Service: Vascular;  Laterality: Right;  . BACK SURGERY     2007 & 2013  . BLADDER SURGERY     Tack  . CARDIAC CATHETERIZATION  2003, 2005, 2009   1 stent  . cardiac stents    . CATARACT EXTRACTION W/PHACO Right 05/04/2014   Procedure: CATARACT EXTRACTION PHACO AND INTRAOCULAR LENS PLACEMENT (IOC);   Surgeon: Williams Che, MD;  Location: [facility];  Service: Ophthalmology;  Laterality: Right;  CDE 1.47  . CATARACT EXTRACTION W/PHACO Left 07/20/2014   Procedure: CATARACT EXTRACTION WITH PHACO AND INTRAOCULAR LENS PLACEMENT LEFT EYE CDE=1.79;  Surgeon: Williams Che, MD;  Location: [facility];  Service: Ophthalmology;  Laterality: Left;  . CERVICAL SPINE SURGERY  2012   8 screws  . CYSTOSCOPY WITH INJECTION N/A 06/03/2013   Procedure: MACROPLASTIQUE  INJECTION ;  Surgeon: Malka So, MD;  Location: [facility];  Service: Urology;  Laterality: N/A;  . FISTULOGRAM Right 10/20/2016   Procedure: FISTULOGRAM WITH POSSIBLE INTERVENTION;  Surgeon: [name], MD;  Location: [facility];  Service: Vascular;  Laterality: Right;  . INSERTION OF DIALYSIS CATHETER N/A 11/26/2015   Procedure: INSERTION OF DIALYSIS CATHETER;  Surgeon: Angelia Mould, MD;  Location: [place];  Service: Vascular;  Laterality: N/A;  . LAMINECTOMY  12/29/2011  . LUMBAR LAMINECTOMY/DECOMPRESSION MICRODISCECTOMY  12/29/2011   Procedure: LUMBAR LAMINECTOMY/DECOMPRESSION MICRODISCECTOMY;  Surgeon: Floyce Stakes, MD;  Location: [place] [facility] OR[facility];  Service: Neurosurgery;  Laterality: N/A;  Lumbar Two through Lumbar Five Laminectomies/Cellsaver  . PARTIAL HYSTERECTOMY    . PERIPHERAL VASCULAR BALLOON ANGIOPLASTY  03/03/2020   Procedure: PERIPHERAL VASCULAR BALLOON ANGIOPLASTY;  Surgeon: Marty Heck, MD;  Location: [place] CV [facility];  Service: Cardiovascular;;  Rt arm fistula  . PERIPHERAL VASCULAR CATHETERIZATION N/A 11/22/2015   Procedure: Fistulagram;  Surgeon: Angelia Mould, MD;  Location: [place] CV LAB;  Service: Cardiovascular;  Laterality: N/A;  . PERIPHERAL VASCULAR INTERVENTION  08/10/2017   Procedure: PERIPHERAL VASCULAR INTERVENTION;  Surgeon: Elam Dutch, MD;  Location: [place] CV [facility];  Service: Cardiovascular;;  . SHOULDER SURGERY Right    Rotator cuff     Current Outpatient Medications   Medication Sig Dispense Refill  . acetaminophen (TYLENOL) 500 MG tablet Take 1,000 mg by mouth every 6 (six) hours as needed for moderate pain or headache.    . albuterol (PROVENTIL) (2.5 MG/3ML) 0.083% nebulizer solution Take 3 mLs (2.5 mg total) by nebulization every 6 (six) hours as needed for wheezing or shortness of breath. 75 mL 12  . albuterol (VENTOLIN HFA) 108 (90 Base) MCG/ACT inhaler Inhale 2 puffs into the lungs every 6 (six) hours as needed for wheezing or shortness of breath. 1 each 2  . aspirin 81 MG EC tablet Take 81 mg by mouth daily.    [name] atenolol (TENORMIN) 50 MG tablet Take 1 tablet (50 mg total) by mouth 2 (two) times daily. 180 tablet 1  . B Complex-C-Folic Acid (DIALYVITE 601) 0.8 MG TABS Take 1 tablet by mouth  daily.    . budesonide-formoterol (SYMBICORT) 160-4.5 MCG/ACT inhaler Inhale 2 puffs into the lungs 2 (two) times daily. 1 each 11  . busPIRone (BUSPAR) 7.5 MG tablet Take 1 tablet (7.5 mg total) by mouth 3 (three) times daily as needed. (Needs to be seen before next refill) 90 tablet 0  . calcium acetate (PHOSLO) 667 MG capsule Take 667-1,334 mg by mouth See admin instructions. Take 1334 mg with meals 3 times daily and 667 mg with snacks    . Carboxymethylcellul-Glycerin (LUBRICATING EYE DROPS OP) Place 1 drop into both eyes daily as needed (dry eyes).    . cetirizine (ZYRTEC) 10 MG tablet Take 1 tablet (10 mg total) by mouth daily as needed for allergies. 90 tablet 3  . Dulaglutide (TRULICITY) 1.5 XL/2.4MW SOPN Inject 1.5 mg into the skin once a week. 2 mL 1  . gabapentin (NEURONTIN) 300 MG capsule Take two after each dialysis 60 capsule 2  . glucose blood (ONETOUCH ULTRA) test strip Use to test blood sugar 3 times daily as directed. DX: E11.9 300 each 12  . Insulin Glargine (BASAGLAR KWIKPEN) 100 UNIT/ML Inject 20 Units into the skin at bedtime.    [name] linaclotide (LINZESS) 72 MCG capsule One daily on days you do NOT have dialysis 30 capsule 2  . NIFEdipine  (PROCARDIA XL/NIFEDICAL-XL) 90 MG 24 hr tablet Take 90 mg by mouth daily.     . nitroGLYCERIN (NITROSTAT) 0.4 MG SL tablet Place 1 tablet (0.4 mg total) under the tongue every 5 (five) minutes as needed for chest pain. 25 tablet 11  . ondansetron (ZOFRAN) 4 MG tablet Take 1 tablet (4 mg total) by mouth every 8 (eight) hours as needed for nausea or vomiting. 20 tablet 3  . QUEtiapine (SEROQUEL) 25 MG tablet Take 1 tablet (25 mg total) by mouth at bedtime. For sleep and for anxiety 90 tablet 1  . Tiotropium Bromide Monohydrate (SPIRIVA RESPIMAT) 1.25 MCG/ACT AERS Inhale 2 puffs into the lungs daily. 4 g 6  . torsemide (DEMADEX) 20 MG tablet Take 40 mg by mouth daily.     [name] triamcinolone cream (KENALOG) 0.1 % Apply 1 application topically 3 (three) times daily. Avoid face and genitalia (Patient taking differently: Apply 1 application topically 3 (three) times daily as needed (rash). Avoid face and genitalia) 45 g 0  . hydrALAZINE (APRESOLINE) 50 MG tablet Take 1 tablet (50 mg total) by mouth 2 (two) times daily. 60 tablet 3  . pravastatin (PRAVACHOL) 40 MG tablet Take 1 tablet (40 mg total) by mouth daily. 90 tablet 3   No current facility-administered medications for this visit.    Allergies:   Azithromycin, Ace inhibitors, Clopidogrel bisulfate, Codeine, Lisinopril, and Penicillins   ROS:  Please see the history of present illness.   Otherwise, review of systems are positive for leg pain in the right hip and down to the bone.   All other systems are reviewed and negative.    PHYSICAL EXAM: VS:  BP 115/63   Pulse 81   Temp (!) 95.7 F (35.4 C)   Ht 5\' 3"  (1.6 m)   Wt 183 lb 9.6 oz (83.3 kg)   SpO2 92%   BMI 32.52 kg/m  , BMI Body mass index is 32.52 kg/m. GENERAL:  Well appearing NECK:  No jugular venous distention, waveform within normal limits, carotid upstroke brisk and symmetric, no bruits, no thyromegaly LUNGS:  Clear to auscultation bilaterally CHEST:  Unremarkable HEART:  PMI  not displaced or sustained,S1  and S2 within normal limits, no S3, no S4, no clicks, no rubs, no murmurs ABD:  Flat, positive bowel sounds normal in frequency in pitch, no bruits, no rebound, no guarding, no midline pulsatile mass, no hepatomegaly, no splenomegaly EXT:  2 plus pulses throughout, no edema, no cyanosis no clubbing, right arm fistula with thrill and bruit   EKG:  EKG is not ordered today.   Recent Labs: 09/15/2020: NT-Pro BNP 16,086 11/01/2020: ALT 13; BUN 46; Creatinine, Ser 10.47; Hemoglobin 11.8; Platelets 271; Potassium 4.5; Sodium 140    Lipid Panel    Component Value Date/Time   CHOL 175 11/01/2020 1022   CHOL 241 (H) 03/25/2013 1026   TRIG 169 (H) 11/01/2020 1022   TRIG 147 10/09/2013 1527   TRIG 209 (H) 03/25/2013 1026   HDL 37 (L) 11/01/2020 1022   HDL 65 10/09/2013 1527   HDL 51 03/25/2013 1026   CHOLHDL 4.7 (H) 11/01/2020 1022   CHOLHDL 3.0 06/17/2008 0625   VLDL 19 06/17/2008 0625   LDLCALC 108 (H) 11/01/2020 1022   LDLCALC 125 (H) 10/09/2013 1527   LDLCALC 148 (H) 03/25/2013 1026      Wt Readings from Last 3 Encounters:  11/17/20 183 lb 9.6 oz (83.3 kg)  11/01/20 182 lb 9.6 oz (82.8 kg)  10/20/20 185 lb (83.9 kg)      Other studies Reviewed: Additional studies/ records that were reviewed today include: Pulmonary note Review of the above records demonstrates:  Please see elsewhere in the note.     ASSESSMENT AND PLAN:  CAD -  The patient has no new sypmtoms.  No further cardiovascular testing is indicated.  We will continue with aggressive risk reduction and meds as listed.  She had a negative perfusion study.  No further testing is indicated.  PROLONGED QT - She had a near normal QT on her EKG last time.  She needs to avoid QT prolonging drugs.   HYPERTENSION, BENIGN -  Her blood pressure is controlled.  No change in therapy.  DYSLIPIDEMIA -   LDL was 108 she agrees to take pravastatin 40.   ACUTE ON CHRONIC DIASTOLIC - Volume is  managed through dialysis.  No change in therapy.   DM - A1C was 6.2 which is down from 7.2.  I will defer to [name], MD     Current medicines are reviewed at length with the patient today.  The patient does not have concerns regarding medicines.  The following changes have been made: As above  Labs/ tests ordered today include: None  No orders of the defined types were placed in this encounter.    Disposition:   FU with me in six months in [place], Minus Breeding, MD  11/17/2020 11:58 AM    [place]

## 2020-11-17 ENCOUNTER — Other Ambulatory Visit: Payer: Self-pay

## 2020-11-17 ENCOUNTER — Ambulatory Visit (INDEPENDENT_AMBULATORY_CARE_PROVIDER_SITE_OTHER): Payer: Medicare Other | Admitting: Cardiology

## 2020-11-17 ENCOUNTER — Encounter: Payer: Self-pay | Admitting: Cardiology

## 2020-11-17 VITALS — BP 115/63 | HR 81 | Temp 95.7°F | Ht 63.0 in | Wt 183.6 lb

## 2020-11-17 DIAGNOSIS — I2510 Atherosclerotic heart disease of native coronary artery without angina pectoris: Secondary | ICD-10-CM | POA: Diagnosis not present

## 2020-11-17 DIAGNOSIS — E1169 Type 2 diabetes mellitus with other specified complication: Secondary | ICD-10-CM

## 2020-11-17 DIAGNOSIS — E785 Hyperlipidemia, unspecified: Secondary | ICD-10-CM | POA: Diagnosis not present

## 2020-11-17 DIAGNOSIS — I5033 Acute on chronic diastolic (congestive) heart failure: Secondary | ICD-10-CM

## 2020-11-17 DIAGNOSIS — I10 Essential (primary) hypertension: Secondary | ICD-10-CM

## 2020-11-17 MED ORDER — PRAVASTATIN SODIUM 40 MG PO TABS: 40.0000 mg | ORAL_TABLET | Freq: Every day | ORAL | 3 refills | Status: DC

## 2020-11-17 NOTE — Patient Instructions (Signed)
Medication Instructions:  Increase pravastatin to 40mg  daily *If you need a refill on your cardiac medications before your next appointment, please call your pharmacy*  Lab Work: None ordered at this visit  Testing/Procedures: None ordered at this visit  Follow-Up: At [facility], you and your health needs are our priority.  As part of our continuing mission to provide you with exceptional heart care, we have created designated Provider Care Teams.  These Care Teams include your primary Cardiologist (physician) and Advanced Practice Providers (APPs -  Physician Assistants and Nurse Practitioners) who all work together to provide you with the care you need, when you need it.  Your next appointment:   6 month(s) in [facility] will receive a reminder letter in the mail two months in advance. If you don't receive a letter, please call our office to schedule the follow-up appointment.  The format for your next appointment:   In Person  Provider:   Minus Breeding, MD

## 2020-11-30 DIAGNOSIS — I259 Chronic ischemic heart disease, unspecified: Secondary | ICD-10-CM | POA: Diagnosis not present

## 2020-11-30 DIAGNOSIS — E119 Type 2 diabetes mellitus without complications: Secondary | ICD-10-CM | POA: Diagnosis not present

## 2020-11-30 DIAGNOSIS — Z794 Long term (current) use of insulin: Secondary | ICD-10-CM | POA: Diagnosis not present

## 2020-11-30 DIAGNOSIS — Z992 Dependence on renal dialysis: Secondary | ICD-10-CM | POA: Diagnosis not present

## 2020-12-03 DIAGNOSIS — Z992 Dependence on renal dialysis: Secondary | ICD-10-CM | POA: Diagnosis not present

## 2020-12-03 DIAGNOSIS — N186 End stage renal disease: Secondary | ICD-10-CM | POA: Diagnosis not present

## 2020-12-04 DIAGNOSIS — Z992 Dependence on renal dialysis: Secondary | ICD-10-CM | POA: Diagnosis not present

## 2020-12-04 DIAGNOSIS — N186 End stage renal disease: Secondary | ICD-10-CM | POA: Diagnosis not present

## 2020-12-04 DIAGNOSIS — E559 Vitamin D deficiency, unspecified: Secondary | ICD-10-CM | POA: Diagnosis not present

## 2020-12-04 DIAGNOSIS — D631 Anemia in chronic kidney disease: Secondary | ICD-10-CM | POA: Diagnosis not present

## 2020-12-04 DIAGNOSIS — N2581 Secondary hyperparathyroidism of renal origin: Secondary | ICD-10-CM | POA: Diagnosis not present

## 2020-12-04 DIAGNOSIS — D509 Iron deficiency anemia, unspecified: Secondary | ICD-10-CM | POA: Diagnosis not present

## 2020-12-10 ENCOUNTER — Ambulatory Visit: Payer: Medicare Other

## 2020-12-10 ENCOUNTER — Other Ambulatory Visit: Payer: Self-pay

## 2020-12-10 DIAGNOSIS — G4730 Sleep apnea, unspecified: Secondary | ICD-10-CM

## 2020-12-13 ENCOUNTER — Other Ambulatory Visit: Payer: Self-pay

## 2020-12-13 ENCOUNTER — Ambulatory Visit (INDEPENDENT_AMBULATORY_CARE_PROVIDER_SITE_OTHER): Payer: Medicare Other | Admitting: Pharmacist

## 2020-12-13 ENCOUNTER — Ambulatory Visit (INDEPENDENT_AMBULATORY_CARE_PROVIDER_SITE_OTHER): Payer: Medicare Other | Admitting: Pulmonary Disease

## 2020-12-13 DIAGNOSIS — R0600 Dyspnea, unspecified: Secondary | ICD-10-CM | POA: Diagnosis not present

## 2020-12-13 DIAGNOSIS — E119 Type 2 diabetes mellitus without complications: Secondary | ICD-10-CM | POA: Diagnosis not present

## 2020-12-13 LAB — PULMONARY FUNCTION TEST
DL/VA % pred: 75 %
DL/VA: 3.17 ml/min/mmHg/L
DLCO cor % pred: 64 %
DLCO cor: 11.72 ml/min/mmHg
DLCO unc % pred: 64 %
DLCO unc: 11.72 ml/min/mmHg
FEF 25-75 Post: 1.8 L/sec
FEF 25-75 Pre: 1.67 L/sec
FEF2575-%Change-Post: 7 %
FEF2575-%Pred-Post: 110 %
FEF2575-%Pred-Pre: 102 %
FEV1-%Change-Post: 1 %
FEV1-%Pred-Post: 96 %
FEV1-%Pred-Pre: 95 %
FEV1-Post: 1.65 L
FEV1-Pre: 1.63 L
FEV1FVC-%Change-Post: 2 %
FEV1FVC-%Pred-Pre: 104 %
FEV6-%Change-Post: 0 %
FEV6-%Pred-Post: 94 %
FEV6-%Pred-Pre: 94 %
FEV6-Post: 2 L
FEV6-Pre: 2 L
FEV6FVC-%Pred-Post: 104 %
FEV6FVC-%Pred-Pre: 104 %
FVC-%Change-Post: 0 %
FVC-%Pred-Post: 90 %
FVC-%Pred-Pre: 90 %
FVC-Post: 2 L
FVC-Pre: 2.01 L
Post FEV1/FVC ratio: 83 %
Post FEV6/FVC ratio: 100 %
Pre FEV1/FVC ratio: 81 %
Pre FEV6/FVC Ratio: 100 %
RV % pred: 88 %
RV: 1.82 L
TLC % pred: 81 %
TLC: 3.88 L

## 2020-12-13 MED ORDER — BREZTRI AEROSPHERE 160-9-4.8 MCG/ACT IN AERO: 2.0000 | INHALATION_SPRAY | Freq: Two times a day (BID) | RESPIRATORY_TRACT | 11 refills | Status: DC

## 2020-12-13 NOTE — Progress Notes (Signed)
Full PFT performed today. °

## 2020-12-13 NOTE — Progress Notes (Signed)
    12/13/2020 Name: [name] MRN: [id number] DOB: 07-06-1951   S:  9 yoF Presents for diabetes evaluation, education, and management Patient was referred and last seen by Primary Care Provider on 11/01/20.  Patient has T2DM and also is on HD T,T,S.  Insurance coverage/medication affordability: medicare  Patient reports adherence with medications. . Current diabetes medications include: basaglar 20 units, trulicity (will d/c today due to GI adverse events) . Current hypertension medications include: hydralazine, atenolol, nifedipine Goal 130/80 . Current hyperlipidemia medications include: pravastatin   Patient denies hypoglycemic events.   O:  Lab Results  Component Value Date   HGBA1C 6.2 11/01/2020    Lipid Panel     Component Value Date/Time   CHOL 175 11/01/2020 1022   CHOL 241 (H) 03/25/2013 1026   TRIG 169 (H) 11/01/2020 1022   TRIG 147 10/09/2013 1527   TRIG 209 (H) 03/25/2013 1026   HDL 37 (L) 11/01/2020 1022   HDL 65 10/09/2013 1527   HDL 51 03/25/2013 1026   CHOLHDL 4.7 (H) 11/01/2020 1022   CHOLHDL 3.0 06/17/2008 0625   VLDL 19 06/17/2008 0625   LDLCALC 108 (H) 11/01/2020 1022   LDLCALC 125 (H) 10/09/2013 1527   LDLCALC 148 (H) 03/25/2013 1026    Home fasting blood sugars: <130   2 hour post-meal/random blood sugars: n/a.    Clinical Atherosclerotic Cardiovascular Disease (ASCVD): No   The 10-year ASCVD risk score Mikey Bussing DC Jr., et al., 2013) is: 16.5%   Values used to calculate the score:     Age: 70 years     Sex: Female     Is Non-Hispanic African American: Yes     Diabetic: Yes     Tobacco smoker: No     Systolic Blood Pressure: 160 mmHg     Is BP treated: Yes     HDL Cholesterol: 37 mg/dL     Total Cholesterol: 162 mg/dL    A/P:  Diabetes T2DM currently controlled (last A1c 6.2%). Patient is able to verbalize appropriate hypoglycemia management plan. Patient is adherent with medication  -Patient cannot tolerate Trulicity,  despite lowering dose (GI adverse events, complicated by dialysis T,T,S).  Will discontinue trulicity and increase basal insulin to --> Basaglar 25 units daily  -re-enroll patient in [place] patient assistance program for 2022, medication to ship to patient's home; patient to call [phone] to set up shipment every 4 months  -Based on cost and convenience, will transition patient to [facility] inhaler (was on symbicort and spiriva--cannot afford)  Patient re-enrolled in [place] patient assistance program for Breztri for 2025  Application filled out online  Medication will ship to patient's home  RX escribed to [facility] with 3 refills  -Medication list updated to reflect changes  -Extensively discussed pathophysiology of diabetes, recommended lifestyle interventions, dietary effects on blood sugar control  -Counseled on s/sx of and management of hypoglycemia  -Next A1C anticipated 01/31/21.  Written patient instructions provided.  Total time in  counseling 20 minutes.   Follow up PCP Clinic Visit ON 01/31/21.    Regina Eck, PharmD, BCPS Clinical Pharmacist, [place]  [facility] Phone [phone]

## 2020-12-15 ENCOUNTER — Ambulatory Visit (INDEPENDENT_AMBULATORY_CARE_PROVIDER_SITE_OTHER): Payer: Medicare Other | Admitting: Pulmonary Disease

## 2020-12-15 ENCOUNTER — Other Ambulatory Visit: Payer: Self-pay

## 2020-12-15 ENCOUNTER — Encounter: Payer: Self-pay | Admitting: Pulmonary Disease

## 2020-12-15 VITALS — BP 118/76 | HR 87 | Temp 98.0°F | Ht 63.0 in | Wt 183.8 lb

## 2020-12-15 DIAGNOSIS — R942 Abnormal results of pulmonary function studies: Secondary | ICD-10-CM | POA: Diagnosis not present

## 2020-12-15 DIAGNOSIS — R0602 Shortness of breath: Secondary | ICD-10-CM | POA: Diagnosis not present

## 2020-12-15 MED ORDER — SPIRIVA RESPIMAT 1.25 MCG/ACT IN AERS: 2.0000 | INHALATION_SPRAY | Freq: Every day | RESPIRATORY_TRACT | 0 refills | Status: DC

## 2020-12-15 NOTE — Patient Instructions (Addendum)
We will provide you with another month of spiriva until your Breztri inhaler kicks in.   We will schedule you for CT Chest scan at your convenience.

## 2020-12-15 NOTE — Progress Notes (Signed)
Synopsis: Referred in 10/2020 by [name], MD for Empysema  Subjective:   PATIENT ID: [name] GENDER: female DOB: Dec 17, 1950, MRN: [id number]   HPI  Chief Complaint  Patient presents with  . Follow-up    F/U PFT and sleep study. States she is still having problems with DOE.    [name] is a 70 year old woman, former smoker with history of coronary artery disease, heart failure preserved EF, ESRD on HD, GERD, hypertension and diabetes mellitus type II who returns to pulmonary clinic for evaluation of shortness of breath.   She had pulmonary function testing done which shows mild diffusion defect. Her spirometry and lung capacity are normal.   She reports improvement in her shortness of breath with the addition of spiriva to her symbicort. A prescription for spiriva was sent in but the medication was too expensive so she is working with a medication specialist to get [place] approved.   She reports her breathing is not quite as good today given the cold weather changes. Hot weather is even harder on her breathing.   She had a home sleep study recently and returned the device on 1/10. No report is available at this time.  OV 10/11/20 She reports she has been more short of breath over recent months as she feels she is taking short shallow breaths and unable to take deep breaths. She was admitted 08/31/20 - 09/03/20 at [facility] for COPD exacerbation secondary to rhinovirus along with volume overload. She has not had an exacerbation in her breathing in 3 years. Since discharge she has remained somewhat short of breath with intermittent cough and rare wheezing. She is currently using symbicort 2 puffs twice daily and using albuterol 2-3 times per day.   She does report snoring at night and does not wake up feeling rested.   Past Medical History:  Diagnosis Date  . Acute on chronic diastolic CHF (congestive heart failure) ([place]) 03/05/2015   Grade 2. EF 60-65%  . Anemia     hx of  . Anxiety   . Arthritis   . CAD (coronary artery disease)    s/p Stenting in 2005;  [place] 7/09: Vigorous LV function, 2-3+ MR, RI 40%, RI stent patent, proximal-mid RCA 40-50%;  Echo 7/09:  EF 55-65%, trivial MR;  [place] 11/18/12: EF 66%, normal LV wall motion, mild to moderate anterior ischemia - reviewed by [facility] and felt to rep breast atten (low risk);  Echo 6/14: mild LVH, EF 55-65%, Gr 2 DD, PASP 44, trivial eff   . Chronic kidney disease    CREATININE IS UP--GOING TO KIDNEY MD   . Chronic neck pain   . COPD (chronic obstructive pulmonary disease) ([place])   . Degenerative joint disease    left shoulder  . Diabetic neuropathy ([place])   . Diverticulosis   . Esophageal stricture   . GERD (gastroesophageal reflux disease)   . Hyperlipidemia   . Hypertension   . IDDM (insulin dependent diabetes mellitus)   . Pericardial effusion 03/06/2015   Very small per echo ; pleural nodules seen on CT chest.  . Peripheral vascular disease ([place])   . Stroke [facility])    "mini- years ago"  . Vitamin D deficiency      Family History  Problem Relation Age of Onset  . Heart disease Other   . Hypertension Father   . Cancer Father   . Hypertension Mother   . Hypertension Daughter   . Pancreatitis Brother   .  Hypertension Daughter   . Diabetes Daughter      Social History   Socioeconomic History  . Marital status: Widowed    Spouse name: Not on file  . Number of children: 2  . Years of education: GED  . Highest education level: GED or equivalent  Occupational History  . Occupation: Disabled  Tobacco Use  . Smoking status: Former Smoker    Packs/day: 1.00    Years: 30.00    Pack years: 30.00    Quit date: 12/04/2001    Years since quitting: 19.0  . Smokeless tobacco: Never Used  Vaping Use  . Vaping Use: Never used  Substance and Sexual Activity  . Alcohol use: No    Alcohol/week: 6.0 standard drinks    Types: 6 Cans of beer per week  . Drug use: No  . Sexual activity: Not  Currently    Birth control/protection: Surgical  Other Topics Concern  . Not on file  Social History Narrative   Lives in [place]   Social Determinants of Health   Financial Resource Strain: Low Risk   . Difficulty of Paying Living Expenses: Not hard at all  Food Insecurity: No Food Insecurity  . Worried About Charity fundraiser in the Last Year: Never true  . Ran Out of Food in the Last Year: Never true  Transportation Needs: No Transportation Needs  . Lack of Transportation (Medical): No  . Lack of Transportation (Non-Medical): No  Physical Activity: Inactive  . Days of Exercise per Week: 0 days  . Minutes of Exercise per Session: 0 min  Stress: No Stress Concern Present  . Feeling of Stress : Not at all  Social Connections: Moderately Integrated  . Frequency of Communication with Friends and Family: More than three times a week  . Frequency of Social Gatherings with Friends and Family: More than three times a week  . Attends Religious Services: More than 4 times per year  . Active Member of Clubs or Organizations: Yes  . Attends Archivist Meetings: More than 4 times per year  . Marital Status: Widowed  Intimate Partner Violence: Not At Risk  . Fear of Current or Ex-Partner: No  . Emotionally Abused: No  . Physically Abused: No  . Sexually Abused: No     Allergies  Allergen Reactions  . Azithromycin     Prolonged QT  . Ace Inhibitors Cough  . Clopidogrel Bisulfate Other (See Comments)    Sick, Headache, "Felt terrible"  . Codeine Other (See Comments)    hallucinations  . Lisinopril Cough  . Penicillins Other (See Comments)    Unknown reaction Did it involve swelling of the face/tongue/throat, SOB, or low BP? Unknown Did it involve sudden or severe rash/hives, skin peeling, or any reaction on the inside of your mouth or nose? Unknown Did you need to seek medical attention at a hospital or doctor's office? Unknown When did it last  happen?childhood allergy If all above answers are "NO", may proceed with cephalosporin use.       Outpatient Medications Prior to Visit  Medication Sig Dispense Refill  . acetaminophen (TYLENOL) 500 MG tablet Take 1,000 mg by mouth every 6 (six) hours as needed for moderate pain or headache.    . albuterol (PROVENTIL) (2.5 MG/3ML) 0.083% nebulizer solution Take 3 mLs (2.5 mg total) by nebulization every 6 (six) hours as needed for wheezing or shortness of breath. 75 mL 12  . albuterol (VENTOLIN HFA) 108 (90 Base) MCG/ACT  inhaler Inhale 2 puffs into the lungs every 6 (six) hours as needed for wheezing or shortness of breath. 1 each 2  . aspirin 81 MG EC tablet Take 81 mg by mouth daily.    [name] atenolol (TENORMIN) 50 MG tablet Take 1 tablet (50 mg total) by mouth 2 (two) times daily. 180 tablet 1  . B Complex-C-Folic Acid (DIALYVITE 740) 0.8 MG TABS Take 1 tablet by mouth daily.    . Budeson-Glycopyrrol-Formoterol (BREZTRI AEROSPHERE) 160-9-4.8 MCG/ACT AERO Inhale 2 puffs into the lungs in the morning and at bedtime. 32.1 g 11  . busPIRone (BUSPAR) 7.5 MG tablet Take 1 tablet (7.5 mg total) by mouth 3 (three) times daily as needed. (Needs to be seen before next refill) 90 tablet 0  . calcium acetate (PHOSLO) 667 MG capsule Take 667-1,334 mg by mouth See admin instructions. Take 1334 mg with meals 3 times daily and 667 mg with snacks    . Carboxymethylcellul-Glycerin (LUBRICATING EYE DROPS OP) Place 1 drop into both eyes daily as needed (dry eyes).    . cetirizine (ZYRTEC) 10 MG tablet Take 1 tablet (10 mg total) by mouth daily as needed for allergies. 90 tablet 3  . gabapentin (NEURONTIN) 300 MG capsule Take two after each dialysis 60 capsule 2  . glucose blood (ONETOUCH ULTRA) test strip Use to test blood sugar 3 times daily as directed. DX: E11.9 300 each 12  . Insulin Glargine (BASAGLAR KWIKPEN) 100 UNIT/ML Inject 20 Units into the skin at bedtime.    [name] linaclotide (LINZESS) 72 MCG capsule  One daily on days you do NOT have dialysis 30 capsule 2  . NIFEdipine (PROCARDIA XL/NIFEDICAL-XL) 90 MG 24 hr tablet Take 90 mg by mouth daily.     . nitroGLYCERIN (NITROSTAT) 0.4 MG SL tablet Place 1 tablet (0.4 mg total) under the tongue every 5 (five) minutes as needed for chest pain. 25 tablet 11  . ondansetron (ZOFRAN) 4 MG tablet Take 1 tablet (4 mg total) by mouth every 8 (eight) hours as needed for nausea or vomiting. 20 tablet 3  . pravastatin (PRAVACHOL) 40 MG tablet Take 1 tablet (40 mg total) by mouth daily. 90 tablet 3  . QUEtiapine (SEROQUEL) 25 MG tablet Take 1 tablet (25 mg total) by mouth at bedtime. For sleep and for anxiety 90 tablet 1  . torsemide (DEMADEX) 20 MG tablet Take 40 mg by mouth daily.     [name] triamcinolone cream (KENALOG) 0.1 % Apply 1 application topically 3 (three) times daily. Avoid face and genitalia (Patient taking differently: Apply 1 application topically 3 (three) times daily as needed (rash). Avoid face and genitalia) 45 g 0  . hydrALAZINE (APRESOLINE) 50 MG tablet Take 1 tablet (50 mg total) by mouth 2 (two) times daily. 60 tablet 3  . budesonide-formoterol (SYMBICORT) 160-4.5 MCG/ACT inhaler Inhale 2 puffs into the lungs 2 (two) times daily. 1 each 11   No facility-administered medications prior to visit.    Review of Systems  Constitutional: Negative for chills, diaphoresis, fever, malaise/fatigue and weight loss.  HENT: Negative for congestion, sinus pain and sore throat.   Eyes: Negative.   Respiratory: Positive for shortness of breath. Negative for cough, hemoptysis, sputum production and wheezing.   Cardiovascular: Negative for chest pain, orthopnea, leg swelling and PND.  Gastrointestinal: Negative for abdominal pain, heartburn, nausea and vomiting.  Musculoskeletal: Negative for joint pain and myalgias.  Skin: Negative for itching and rash.  Neurological: Negative.   Endo/Heme/Allergies: Negative.  Psychiatric/Behavioral: Negative.     Objective:   Vitals:   12/15/20 0948  BP: 118/76  Pulse: 87  Temp: 98 F (36.7 C)  TempSrc: Temporal  SpO2: 98%  Weight: 183 lb 12.8 oz (83.4 kg)  Height: 5\' 3"  (1.6 m)     Physical Exam Constitutional:      General: She is not in acute distress.    Appearance: Normal appearance. She is obese. She is not ill-appearing.  HENT:     Head: Normocephalic and atraumatic.     Nose: Nose normal.     Mouth/Throat:     Mouth: Mucous membranes are moist.     Pharynx: Oropharynx is clear.  Eyes:     General: No scleral icterus.    Conjunctiva/sclera: Conjunctivae normal.     Pupils: Pupils are equal, round, and reactive to light.  Cardiovascular:     Rate and Rhythm: Normal rate and regular rhythm.     Pulses: Normal pulses.     Heart sounds: Normal heart sounds. No murmur heard.   Pulmonary:     Effort: Pulmonary effort is normal.     Breath sounds: Decreased air movement present. No wheezing, rhonchi or rales.  Abdominal:     General: Bowel sounds are normal.     Palpations: Abdomen is soft.  Musculoskeletal:     Cervical back: Neck supple.     Right lower leg: No edema.     Left lower leg: No edema.  Lymphadenopathy:     Cervical: No cervical adenopathy.  Skin:    Capillary Refill: Capillary refill takes less than 2 seconds.     Findings: No lesion or rash.  Neurological:     General: No focal deficit present.     Mental Status: She is alert and oriented to person, place, and time. Mental status is at baseline.  Psychiatric:        Mood and Affect: Mood normal.        Behavior: Behavior normal.        Thought Content: Thought content normal.        Judgment: Judgment normal.    CBC    Component Value Date/Time   WBC 12.9 (H) 11/01/2020 1022   WBC 9.2 11/13/2015 1455   RBC 4.08 11/01/2020 1022   RBC 3.84 (L) 11/13/2015 1455   HGB 11.8 11/01/2020 1022   HCT 36.7 11/01/2020 1022   PLT 271 11/01/2020 1022   MCV 90 11/01/2020 1022   MCH 28.9 11/01/2020  1022   MCH 26.6 11/13/2015 1455   MCHC 32.2 11/01/2020 1022   MCHC 32.7 11/13/2015 1455   RDW 13.9 11/01/2020 1022   LYMPHSABS 2.9 11/01/2020 1022   MONOABS 0.5 11/13/2015 1455   EOSABS 0.7 (H) 11/01/2020 1022   BASOSABS 0.1 11/01/2020 1022   BMP Latest Ref Rng & Units 11/01/2020 09/15/2020 07/21/2020  Glucose 65 - 99 mg/dL 121(H) 116(H) 153(H)  BUN 8 - 27 mg/dL 46(H) 37(H) 47(H)  Creatinine 0.57 - 1.00 mg/dL 10.47(HH) 8.20(HH) 7.87(HH)  BUN/Creat Ratio 12 - 28 4(L) 5(L) 6(L)  Sodium 134 - 144 mmol/L 140 140 141  Potassium 3.5 - 5.2 mmol/L 4.5 4.2 4.6  Chloride 96 - 106 mmol/L 94(L) 94(L) 100  CO2 20 - 29 mmol/L 24 28 26   Calcium 8.7 - 10.3 mg/dL 9.6 8.2(L) 8.7   Chest imaging: CXR 09/15/20 Partially visualized surgical hardware from ACDF. Stable cardiomediastinal silhouette with mild cardiomegaly. No pneumothorax. No pleural effusion. Lungs appear clear, with no  acute consolidative airspace disease and no pulmonary edema.  CT Chest WO Contrast 05/20/2018 Cardiovascular: Atherosclerotic calcifications are noted within the thoracic aorta and its branches. Coronary calcifications are noted. No aneurysmal dilatation is seen. Mild cardiac enlargement is noted. No pericardial fluid is seen.  Mediastinum/Nodes: Thoracic inlet is within normal limits. Scattered small mediastinal nodes are seen stable in appearance from the prior exam. No sizable lymphadenopathy is identified. No hilar adenopathy is noted. The esophagus as visualized is within normal limits.  Lungs/Pleura: Lungs are well aerated without focal infiltrate, effusion or pneumothorax. Scattered small pulmonary nodules are noted throughout both lungs and are stable from the prior CT of the chest abdomen and pelvis from 2018. Review of a previous CT of the chest from 12/15/2015 also shows these nodules to be stable and consistent with a benign etiology. No further follow-up is Necessary.  PFT: PFT Results Latest Ref  Rng & Units 12/13/2020 03/17/2015  FVC-Pre L 2.01 2.02  FVC-Predicted Pre % 90 85  FVC-Post L 2.00 2.03  FVC-Predicted Post % 90 85  Pre FEV1/FVC % % 81 84  Post FEV1/FCV % % 83 85  FEV1-Pre L 1.63 1.70  FEV1-Predicted Pre % 95 92  FEV1-Post L 1.65 1.74  DLCO uncorrected ml/min/mmHg 11.72 13.62  DLCO UNC% % 64 63  DLCO corrected ml/min/mmHg 11.72 -  DLCO COR %Predicted % 64 -  DLVA Predicted % 75 94  TLC L 3.88 -  TLC % Predicted % 81 -  RV % Predicted % 88 -   Echo: 2016 - Left ventricle: The cavity size was normal. Wall thickness was  increased in a pattern of mild LVH. Systolic function was normal.  The estimated ejection fraction was in the range of 60% to 65%.  Wall motion was normal; there were no regional wall motion  abnormalities. Doppler parameters are consistent with restrictive  physiology, indicative of decreased left ventricular diastolic  compliance and/or increased left atrial pressure.  - Mitral valve: There was mild regurgitation.  - Left atrium: The atrium was severely dilated.  - Right atrium: Central venous pressure (est): 3 mm Hg.  - Tricuspid valve: There was trivial regurgitation.  - Pulmonary arteries: Systolic pressure could not be accurately  estimated.  - Pericardium, extracardiac: A small pericardial effusion was  identified circumferential to the heart.   NM Stress Test 10/20/2020  The left ventricular ejection fraction is normal (55-65%).  Nuclear stress EF: 55%.  There was no ST segment deviation noted during stress.  No T wave inversion was noted during stress.  The study is normal.  This is a low risk study.  Assessment & Plan:   Decreased diffusion capacity of lung  Shortness of breath - Plan: CT Chest High Resolution  Discussion: [name] is a 70 year old woman, former smoker with history of coronary artery disease, heart failure preserved EF, ESRD on HD, GERD, hypertension and diabetes mellitus type II  who returns to pulmonary clinic for evaluation of shortness of breath.  She has a mild diffusion defect on recent pulmonary function testing which is possibly related to emphysema vs asthma given her smoking history and clinical history of dyspnea/wheezing. She has noted benefit from spiriva and symbicort combination inhaler therapy. She is currently getting approved for breztri which I agree with. We will provide her with a month of spiriva samples as she has another symbicort inhaler at home already that she has paid for.   The diffusion defect could also be related to  a degree of pulmonary hypertension as she has risk factors of sleep disordered breathing and obstructive lung disease.   We are awaiting her home sleep study results. She may benefit from a sleep titration study as she had a sleep study in 2014 that was concerning for mixed obstructive and central sleep apnea.    We will obtain a HRCT chest to further evaluate her lung parenchyma given the diffusion defect and continued dyspnea.  She is to follow up in 4 months.  Freda Jackson, MD [facility] Office: [phone]    Current Outpatient Medications:  .  acetaminophen (TYLENOL) 500 MG tablet, Take 1,000 mg by mouth every 6 (six) hours as needed for moderate pain or headache., Disp: , Rfl:  .  albuterol (PROVENTIL) (2.5 MG/3ML) 0.083% nebulizer solution, Take 3 mLs (2.5 mg total) by nebulization every 6 (six) hours as needed for wheezing or shortness of breath., Disp: 75 mL, Rfl: 12 .  albuterol (VENTOLIN HFA) 108 (90 Base) MCG/ACT inhaler, Inhale 2 puffs into the lungs every 6 (six) hours as needed for wheezing or shortness of breath., Disp: 1 each, Rfl: 2 .  aspirin 81 MG EC tablet, Take 81 mg by mouth daily., Disp: , Rfl:  .  atenolol (TENORMIN) 50 MG tablet, Take 1 tablet (50 mg total) by mouth 2 (two) times daily., Disp: 180 tablet, Rfl: 1 .  B Complex-C-Folic Acid (DIALYVITE 761) 0.8 MG TABS, Take 1  tablet by mouth daily., Disp: , Rfl:  .  Budeson-Glycopyrrol-Formoterol (BREZTRI AEROSPHERE) 160-9-4.8 MCG/ACT AERO, Inhale 2 puffs into the lungs in the morning and at bedtime., Disp: 32.1 g, Rfl: 11 .  busPIRone (BUSPAR) 7.5 MG tablet, Take 1 tablet (7.5 mg total) by mouth 3 (three) times daily as needed. (Needs to be seen before next refill), Disp: 90 tablet, Rfl: 0 .  calcium acetate (PHOSLO) 667 MG capsule, Take 667-1,334 mg by mouth See admin instructions. Take 1334 mg with meals 3 times daily and 667 mg with snacks, Disp: , Rfl:  .  Carboxymethylcellul-Glycerin (LUBRICATING EYE DROPS OP), Place 1 drop into both eyes daily as needed (dry eyes)., Disp: , Rfl:  .  cetirizine (ZYRTEC) 10 MG tablet, Take 1 tablet (10 mg total) by mouth daily as needed for allergies., Disp: 90 tablet, Rfl: 3 .  gabapentin (NEURONTIN) 300 MG capsule, Take two after each dialysis, Disp: 60 capsule, Rfl: 2 .  glucose blood (ONETOUCH ULTRA) test strip, Use to test blood sugar 3 times daily as directed. DX: E11.9, Disp: 300 each, Rfl: 12 .  Insulin Glargine (BASAGLAR KWIKPEN) 100 UNIT/ML, Inject 20 Units into the skin at bedtime., Disp: , Rfl:  .  linaclotide (LINZESS) 72 MCG capsule, One daily on days you do NOT have dialysis, Disp: 30 capsule, Rfl: 2 .  NIFEdipine (PROCARDIA XL/NIFEDICAL-XL) 90 MG 24 hr tablet, Take 90 mg by mouth daily. , Disp: , Rfl:  .  nitroGLYCERIN (NITROSTAT) 0.4 MG SL tablet, Place 1 tablet (0.4 mg total) under the tongue every 5 (five) minutes as needed for chest pain., Disp: 25 tablet, Rfl: 11 .  ondansetron (ZOFRAN) 4 MG tablet, Take 1 tablet (4 mg total) by mouth every 8 (eight) hours as needed for nausea or vomiting., Disp: 20 tablet, Rfl: 3 .  pravastatin (PRAVACHOL) 40 MG tablet, Take 1 tablet (40 mg total) by mouth daily., Disp: 90 tablet, Rfl: 3 .  QUEtiapine (SEROQUEL) 25 MG tablet, Take 1 tablet (25 mg total) by mouth at bedtime.  For sleep and for anxiety, Disp: 90 tablet, Rfl: 1 .   Tiotropium Bromide Monohydrate (SPIRIVA RESPIMAT) 1.25 MCG/ACT AERS, Inhale 2 puffs into the lungs daily., Disp: 8 g, Rfl: 0 .  torsemide (DEMADEX) 20 MG tablet, Take 40 mg by mouth daily. , Disp: , Rfl:  .  triamcinolone cream (KENALOG) 0.1 %, Apply 1 application topically 3 (three) times daily. Avoid face and genitalia (Patient taking differently: Apply 1 application topically 3 (three) times daily as needed (rash). Avoid face and genitalia), Disp: 45 g, Rfl: 0 .  hydrALAZINE (APRESOLINE) 50 MG tablet, Take 1 tablet (50 mg total) by mouth 2 (two) times daily., Disp: 60 tablet, Rfl: 3

## 2020-12-17 ENCOUNTER — Telehealth: Payer: Self-pay | Admitting: Pulmonary Disease

## 2020-12-17 DIAGNOSIS — G4730 Sleep apnea, unspecified: Secondary | ICD-10-CM

## 2020-12-17 NOTE — Telephone Encounter (Signed)
Spoke with patient that her home sleep study only records 30 minutes of sleep time and is not sufficient to interpret. Gave her the opportunity to re-do the home sleep study or do an in-lab sleep study and she preferred to do an in lab sleep study.   I have placed an order for a split night sleep study.

## 2020-12-22 ENCOUNTER — Telehealth: Payer: Self-pay | Admitting: Pulmonary Disease

## 2020-12-22 DIAGNOSIS — J441 Chronic obstructive pulmonary disease with (acute) exacerbation: Secondary | ICD-10-CM

## 2020-12-22 MED ORDER — ALBUTEROL SULFATE HFA 108 (90 BASE) MCG/ACT IN AERS: 2.0000 | INHALATION_SPRAY | Freq: Four times a day (QID) | RESPIRATORY_TRACT | 2 refills | Status: DC | PRN

## 2020-12-22 NOTE — Telephone Encounter (Signed)
Patient needs refill of albuterol .  Office note reviewed. Refill sent to pharmacy per request

## 2020-12-27 ENCOUNTER — Other Ambulatory Visit: Payer: Self-pay

## 2020-12-27 ENCOUNTER — Encounter: Payer: Self-pay | Admitting: Family Medicine

## 2020-12-27 ENCOUNTER — Ambulatory Visit (INDEPENDENT_AMBULATORY_CARE_PROVIDER_SITE_OTHER): Payer: Medicare Other | Admitting: Family Medicine

## 2020-12-27 VITALS — BP 129/71 | HR 68 | Temp 98.1°F | Ht 63.0 in | Wt 187.8 lb

## 2020-12-27 DIAGNOSIS — Z794 Long term (current) use of insulin: Secondary | ICD-10-CM

## 2020-12-27 DIAGNOSIS — E1140 Type 2 diabetes mellitus with diabetic neuropathy, unspecified: Secondary | ICD-10-CM

## 2020-12-27 MED ORDER — GABAPENTIN 400 MG PO CAPS
ORAL_CAPSULE | ORAL | 2 refills | Status: DC
Start: 2020-12-27 — End: 2021-02-21

## 2020-12-27 NOTE — Progress Notes (Signed)
Subjective:  Patient ID: [name], female    DOB: 06/04/1951  Age: 70 y.o. MRN: [id number]  CC: Leg Pain (Bilateral/)   HPI ATZHIRI RATHSACK presents for bilateral leg pain that gets worse with activity. Her gabapentin gives her no relief.She denies any injury. There is a burning sensation. Pain is moderately severe.   Depression screen [facility] 2/9 12/27/2020 11/01/2020 09/15/2020  Decreased Interest 0 0 0  Down, Depressed, Hopeless 0 0 0  PHQ - 2 Score 0 0 0  Altered sleeping - - -  Tired, decreased energy - - -  Change in appetite - - -  Feeling bad or failure about yourself  - - -  Trouble concentrating - - -  Moving slowly or fidgety/restless - - -  Suicidal thoughts - - -  PHQ-9 Score - - -  Some recent data might be hidden    History Daijia has a past medical history of Acute on chronic diastolic CHF (congestive heart failure) ([place]) (03/05/2015), Anemia, Anxiety, Arthritis, CAD (coronary artery disease), Chronic kidney disease, Chronic neck pain, COPD (chronic obstructive pulmonary disease) ([place]), Degenerative joint disease, Diabetic neuropathy ([place]), Diverticulosis, Esophageal stricture, GERD (gastroesophageal reflux disease), Hyperlipidemia, Hypertension, IDDM (insulin dependent diabetes mellitus), Pericardial effusion (03/06/2015), Peripheral vascular disease ([place]), Stroke ([place]), and Vitamin D deficiency.   She has a past surgical history that includes Bladder surgery; Partial hysterectomy; Shoulder surgery (Right); Cervical spine surgery (2012); Abdominal hysterectomy; Laminectomy (12/29/2011); cardiac stents; Lumbar laminectomy/decompression microdiscectomy (12/29/2011); Back surgery; Cystoscopy with injection (N/A, 06/03/2013); Cardiac catheterization (2003, 2005, 2009); Cataract extraction w/PHACO (Right, 05/04/2014); Cataract extraction w/PHACO (Left, 07/20/2014); AV fistula placement (Right, 07/20/2015); Cardiac catheterization (N/A, 11/22/2015); Insertion of dialysis catheter (N/A,  11/26/2015); AV fistula placement (Right, 11/26/2015); Fistulogram (Right, 10/20/2016); A/V Fistulagram (N/A, 08/10/2017); PERIPHERAL VASCULAR INTERVENTION (08/10/2017); A/V Fistulagram (N/A, 03/03/2020); and PERIPHERAL VASCULAR BALLOON ANGIOPLASTY (03/03/2020).   Her family history includes Cancer in her father; Diabetes in her daughter; Heart disease in an other family member; Hypertension in her daughter, daughter, father, and mother; Pancreatitis in her brother.She reports that she quit smoking about 19 years ago. She has a 30.00 pack-year smoking history. She has never used smokeless tobacco. She reports that she does not drink alcohol and does not use drugs.    ROS Review of Systems  Constitutional: Negative.   HENT: Negative.   Eyes: Negative for visual disturbance.  Respiratory: Negative for shortness of breath.   Cardiovascular: Negative for chest pain.  Gastrointestinal: Negative for abdominal pain.  Musculoskeletal: Negative for arthralgias.    Objective:  BP 129/71   Pulse 68   Temp 98.1 F (36.7 C) (Temporal)   Ht '5\' 3"'$  (1.6 m)   Wt 187 lb 12.8 oz (85.2 kg)   BMI 33.27 kg/m   BP Readings from Last 3 Encounters:  12/27/20 129/71  12/15/20 118/76  11/17/20 115/63    Wt Readings from Last 3 Encounters:  12/27/20 187 lb 12.8 oz (85.2 kg)  12/15/20 183 lb 12.8 oz (83.4 kg)  11/17/20 183 lb 9.6 oz (83.3 kg)     Physical Exam Constitutional:      General: She is not in acute distress.    Appearance: She is well-developed.  HENT:     Head: Normocephalic and atraumatic.  Eyes:     Conjunctiva/sclera: Conjunctivae normal.     Pupils: Pupils are equal, round, and reactive to light.  Neck:     Thyroid: No thyromegaly.  Cardiovascular:     Rate and  Rhythm: Normal rate and regular rhythm.     Heart sounds: Normal heart sounds. No murmur heard.   Pulmonary:     Effort: Pulmonary effort is normal. No respiratory distress.     Breath sounds: Normal breath sounds. No  wheezing or rales.  Abdominal:     General: Bowel sounds are normal. There is no distension.     Palpations: Abdomen is soft.     Tenderness: There is no abdominal tenderness.  Musculoskeletal:        General: Normal range of motion.     Cervical back: Normal range of motion and neck supple.  Lymphadenopathy:     Cervical: No cervical adenopathy.  Skin:    General: Skin is warm and dry.  Neurological:     Mental Status: She is alert and oriented to person, place, and time.  Psychiatric:        Behavior: Behavior normal.        Thought Content: Thought content normal.        Judgment: Judgment normal.       Assessment & Plan:   Zahara was seen today for leg pain.  Diagnoses and all orders for this visit:  Type 2 diabetes mellitus with diabetic neuropathy, with long-term current use of insulin ([place]) -     Ambulatory referral to Neurology  Other orders -     gabapentin (NEURONTIN) 400 MG capsule; Take two after each dialysis       I have changed Ivin Booty E. Everding's gabapentin. I am also having her maintain her aspirin, calcium acetate, torsemide, hydrALAZINE, nitroGLYCERIN, acetaminophen, Dialyvite 800, NIFEdipine, triamcinolone, ondansetron, Carboxymethylcellul-Glycerin (LUBRICATING EYE DROPS OP), albuterol, OneTouch Ultra, busPIRone, Basaglar KwikPen, cetirizine, atenolol, QUEtiapine, linaclotide, pravastatin, Breztri Aerosphere, Spiriva Respimat, and albuterol.  Allergies as of 12/27/2020      Reactions   Azithromycin    Prolonged QT   Ace Inhibitors Cough   Clopidogrel Bisulfate Other (See Comments)   Sick, Headache, "Felt terrible"   Codeine Other (See Comments)   hallucinations   Lisinopril Cough   Penicillins Other (See Comments)   Unknown reaction Did it involve swelling of the face/tongue/throat, SOB, or low BP? Unknown Did it involve sudden or severe rash/hives, skin peeling, or any reaction on the inside of your mouth or nose? Unknown Did you need to seek  medical attention at a hospital or doctor's office? Unknown When did it last happen?childhood allergy If all above answers are "NO", may proceed with cephalosporin use.      Medication List       Accurate as of December 27, 2020 11:59 PM. If you have any questions, ask your nurse or doctor.        acetaminophen 500 MG tablet Commonly known as: TYLENOL Take 1,000 mg by mouth every 6 (six) hours as needed for moderate pain or headache.   albuterol (2.5 MG/3ML) 0.083% nebulizer solution Commonly known as: PROVENTIL Take 3 mLs (2.5 mg total) by nebulization every 6 (six) hours as needed for wheezing or shortness of breath.   albuterol 108 (90 Base) MCG/ACT inhaler Commonly known as: VENTOLIN HFA Inhale 2 puffs into the lungs every 6 (six) hours as needed for wheezing or shortness of breath.   aspirin 81 MG EC tablet Take 81 mg by mouth daily.   atenolol 50 MG tablet Commonly known as: TENORMIN Take 1 tablet (50 mg total) by mouth 2 (two) times daily.   Basaglar KwikPen 100 UNIT/ML Inject 20 Units into the skin at  bedtime.   Breztri Aerosphere 160-9-4.8 MCG/ACT Aero Generic drug: Budeson-Glycopyrrol-Formoterol Inhale 2 puffs into the lungs in the morning and at bedtime.   busPIRone 7.5 MG tablet Commonly known as: BUSPAR Take 1 tablet (7.5 mg total) by mouth 3 (three) times daily as needed. (Needs to be seen before next refill)   calcium acetate 667 MG capsule Commonly known as: PHOSLO Take 667-1,334 mg by mouth See admin instructions. Take 1334 mg with meals 3 times daily and 667 mg with snacks   cetirizine 10 MG tablet Commonly known as: ZYRTEC Take 1 tablet (10 mg total) by mouth daily as needed for allergies.   Dialyvite 800 0.8 MG Tabs Take 1 tablet by mouth daily.   gabapentin 400 MG capsule Commonly known as: NEURONTIN Take two after each dialysis What changed: medication strength Changed by: [name], MD   hydrALAZINE 50 MG tablet Commonly  known as: APRESOLINE Take 1 tablet (50 mg total) by mouth 2 (two) times daily.   linaclotide 72 MCG capsule Commonly known as: Linzess One daily on days you do NOT have dialysis   LUBRICATING EYE DROPS OP Place 1 drop into both eyes daily as needed (dry eyes).   NIFEdipine 90 MG 24 hr tablet Commonly known as: PROCARDIA XL/NIFEDICAL-XL Take 90 mg by mouth daily.   nitroGLYCERIN 0.4 MG SL tablet Commonly known as: NITROSTAT Place 1 tablet (0.4 mg total) under the tongue every 5 (five) minutes as needed for chest pain.   ondansetron 4 MG tablet Commonly known as: Zofran Take 1 tablet (4 mg total) by mouth every 8 (eight) hours as needed for nausea or vomiting.   OneTouch Ultra test strip Generic drug: glucose blood Use to test blood sugar 3 times daily as directed. DX: E11.9   pravastatin 40 MG tablet Commonly known as: PRAVACHOL Take 1 tablet (40 mg total) by mouth daily.   QUEtiapine 25 MG tablet Commonly known as: SEROQUEL Take 1 tablet (25 mg total) by mouth at bedtime. For sleep and for anxiety   Spiriva Respimat 1.25 MCG/ACT Aers Generic drug: Tiotropium Bromide Monohydrate Inhale 2 puffs into the lungs daily.   torsemide 20 MG tablet Commonly known as: DEMADEX Take 40 mg by mouth daily.   triamcinolone 0.1 % Commonly known as: KENALOG Apply 1 application topically 3 (three) times daily. Avoid face and genitalia What changed:   when to take this  reasons to take this        Follow-up: No follow-ups on file.  [name], M.D.

## 2020-12-28 ENCOUNTER — Encounter: Payer: Self-pay | Admitting: Family Medicine

## 2020-12-28 DIAGNOSIS — Z992 Dependence on renal dialysis: Secondary | ICD-10-CM | POA: Diagnosis not present

## 2020-12-29 ENCOUNTER — Other Ambulatory Visit: Payer: Self-pay

## 2020-12-29 ENCOUNTER — Ambulatory Visit: Payer: Medicare Other | Attending: Pulmonary Disease | Admitting: Pulmonary Disease

## 2020-12-29 DIAGNOSIS — G4730 Sleep apnea, unspecified: Secondary | ICD-10-CM

## 2021-01-03 ENCOUNTER — Other Ambulatory Visit: Payer: Self-pay | Admitting: Family Medicine

## 2021-01-03 DIAGNOSIS — Z992 Dependence on renal dialysis: Secondary | ICD-10-CM | POA: Diagnosis not present

## 2021-01-03 DIAGNOSIS — N186 End stage renal disease: Secondary | ICD-10-CM | POA: Diagnosis not present

## 2021-01-04 DIAGNOSIS — Z992 Dependence on renal dialysis: Secondary | ICD-10-CM | POA: Diagnosis not present

## 2021-01-04 DIAGNOSIS — D631 Anemia in chronic kidney disease: Secondary | ICD-10-CM | POA: Diagnosis not present

## 2021-01-04 DIAGNOSIS — N2581 Secondary hyperparathyroidism of renal origin: Secondary | ICD-10-CM | POA: Diagnosis not present

## 2021-01-04 DIAGNOSIS — D509 Iron deficiency anemia, unspecified: Secondary | ICD-10-CM | POA: Diagnosis not present

## 2021-01-04 DIAGNOSIS — N186 End stage renal disease: Secondary | ICD-10-CM | POA: Diagnosis not present

## 2021-01-05 NOTE — Progress Notes (Deleted)
Patient Name: [name], Martins Date: 12/29/2020 Gender: Female D.O.B: 1950-12-12 Age (years): 69 Referring Provider: Freda Jackson Height (inches): 79 Interpreting Physician: Chesley Mires MD, ABSM Weight (lbs): 187 RPSGT: Peak, Robert BMI: 33 MRN: [id number] Neck Size: 14.50  CLINICAL INFORMATION Sleep Study Type: NPSG  Indication for sleep study: Assessment of sleep apnea.  Epworth Sleepiness Score: 2  SLEEP STUDY TECHNIQUE As per the AASM Manual for the Scoring of Sleep and Associated Events v2.3 (April 2016) with a hypopnea requiring 4% desaturations.  The channels recorded and monitored were frontal, central and occipital EEG, electrooculogram (EOG), submentalis EMG (chin), nasal and oral airflow, thoracic and abdominal wall motion, anterior tibialis EMG, snore microphone, electrocardiogram, and pulse oximetry.  MEDICATIONS Medications self-administered by patient taken the night of the study : N/A  SLEEP ARCHITECTURE The study was initiated at 9:41:36 PM and ended at 5:13:36 AM.  Sleep onset time was 38.3 minutes and the sleep efficiency was 84.2%. The total sleep time was 380.7 minutes.  Stage REM latency was 135.0 minutes.  The patient spent 8.01% of the night in stage N1 sleep, 76.88% in stage N2 sleep, 0.00% in stage N3 and 15.1% in REM.  Alpha intrusion was absent.  Supine sleep was 0.00%.  RESPIRATORY PARAMETERS The overall apnea/hypopnea index (AHI) was 1.1 per hour. There were 3 total apneas, including 0 obstructive, 3 central and 0 mixed apneas. There were 4 hypopneas and 7 RERAs.  The AHI during Stage REM sleep was 2.1 per hour.  AHI while supine was N/A per hour.  The mean oxygen saturation was 92.00%. The minimum SpO2 during sleep was 88.00%.  moderate snoring was noted during this study.  CARDIAC DATA The 2 lead EKG demonstrated sinus rhythm. The mean heart rate was 75.39 beats per minute. Other EKG findings include: None.  LEG MOVEMENT  DATA The total PLMS were 381 with a resulting PLMS index of 60.05. Associated arousal with leg movement index was 4.9 .  IMPRESSIONS - No significant obstructive sleep apnea occurred during this study (AHI = 1.1/h). - No significant central sleep apnea occurred during this study (CAI = 0.5/h). - The patient had minimal or no oxygen desaturation during the study (Min O2 = 88.00%) - The patient snored with moderate snoring volume. - No cardiac abnormalities were noted during this study. - Severe periodic limb movements of sleep occurred during the study. No significant associated arousals.  DIAGNOSIS - Periodic limb movements of sleep. - Snoring.  RECOMMENDATIONS - Avoid alcohol, sedatives and other CNS depressants that may worsen sleep apnea and disrupt normal sleep architecture. - Sleep hygiene should be reviewed to assess factors that may improve sleep quality. - Weight management and regular exercise should be initiated or continued if appropriate. - Assess for the presence of restless leg syndrome.  [Electronically signed] 01/05/2021 08:54 AM  Chesley Mires MD, ABSM Diplomate, American Board of Sleep Medicine   NPI: [id number]

## 2021-01-05 NOTE — Progress Notes (Deleted)
   Subjective:    Patient ID: [name], female    DOB: 09-08-1951, 70 y.o.   MRN: [id number]  HPI    Review of Systems     Objective:   Physical Exam        Assessment & Plan:

## 2021-01-06 NOTE — Procedures (Signed)
    Patient Name: [name], [name] Date: 12/29/2020 Gender: Female D.O.B: 06/02/1951 Age (years): 69 Referring Provider: Freda Jackson Height (inches): 74 Interpreting Physician: Chesley Mires MD, ABSM Weight (lbs): 187 RPSGT: Peak, Robert BMI: 33 MRN: [id number] Neck Size: 14.50  CLINICAL INFORMATION Sleep Study Type: NPSG  Indication for sleep study: Assessment of sleep apnea.  Epworth Sleepiness Score: 2  SLEEP STUDY TECHNIQUE As per the AASM Manual for the Scoring of Sleep and Associated Events v2.3 (April 2016) with a hypopnea requiring 4% desaturations.  The channels recorded and monitored were frontal, central and occipital EEG, electrooculogram (EOG), submentalis EMG (chin), nasal and oral airflow, thoracic and abdominal wall motion, anterior tibialis EMG, snore microphone, electrocardiogram, and pulse oximetry.  MEDICATIONS Medications self-administered by patient taken the night of the study : N/A  SLEEP ARCHITECTURE The study was initiated at 9:41:36 PM and ended at 5:13:36 AM.  Sleep onset time was 38.3 minutes and the sleep efficiency was 84.2%. The total sleep time was 380.7 minutes.  Stage REM latency was 135.0 minutes.  The patient spent 8.01% of the night in stage N1 sleep, 76.88% in stage N2 sleep, 0.00% in stage N3 and 15.1% in REM.  Alpha intrusion was absent.  Supine sleep was 0.00%.  RESPIRATORY PARAMETERS The overall apnea/hypopnea index (AHI) was 1.1 per hour. There were 3 total apneas, including 0 obstructive, 3 central and 0 mixed apneas. There were 4 hypopneas and 7 RERAs.  The AHI during Stage REM sleep was 2.1 per hour.  AHI while supine was N/A per hour.  The mean oxygen saturation was 92.00%. The minimum SpO2 during sleep was 88.00%.  moderate snoring was noted during this study.  CARDIAC DATA The 2 lead EKG demonstrated sinus rhythm. The mean heart rate was 75.39 beats per minute. Other EKG findings include: None.  LEG  MOVEMENT DATA The total PLMS were 381 with a resulting PLMS index of 60.05. Associated arousal with leg movement index was 4.9 .  IMPRESSIONS - No significant obstructive sleep apnea occurred during this study (AHI = 1.1/h). - No significant central sleep apnea occurred during this study (CAI = 0.5/h). - The patient had minimal or no oxygen desaturation during the study (Min O2 = 88.00%) - The patient snored with moderate snoring volume. - No cardiac abnormalities were noted during this study. - Severe periodic limb movements of sleep occurred during the study. No significant associated arousals.  DIAGNOSIS - Periodic limb movements of sleep. - Snoring.  RECOMMENDATIONS - Avoid alcohol, sedatives and other CNS depressants that may worsen sleep apnea and disrupt normal sleep architecture. - Sleep hygiene should be reviewed to assess factors that may improve sleep quality. - Weight management and regular exercise should be initiated or continued if appropriate. - Assess for the presence of restless leg syndrome.  [Electronically signed] 01/05/2021 08:54 AM  Chesley Mires MD, ABSM Diplomate, American Board of Sleep Medicine   NPI: [id number]

## 2021-01-10 ENCOUNTER — Ambulatory Visit (HOSPITAL_COMMUNITY): Payer: Medicare Other

## 2021-01-24 ENCOUNTER — Telehealth: Payer: Self-pay

## 2021-01-24 NOTE — Telephone Encounter (Signed)
-----   Message from Freddi Starr, MD sent at 01/20/2021  7:10 PM EST ----- Regarding: Sleep Study results Please let the patient know that she does not have sleep apnea based on her recent test.   Thanks, Jon  ----- Message ----- From: Chesley Mires, MD Sent: 01/06/2021   9:06 AM EST To: [name], MD, Freddi Starr, MD

## 2021-01-24 NOTE — Telephone Encounter (Signed)
Left message for patient to call back  

## 2021-01-24 NOTE — Telephone Encounter (Signed)
Spoke with patient. She is aware of results and verbalized understanding. Nothing further needed at time of call.

## 2021-01-31 ENCOUNTER — Ambulatory Visit (INDEPENDENT_AMBULATORY_CARE_PROVIDER_SITE_OTHER): Payer: Medicare Other | Admitting: Family Medicine

## 2021-01-31 ENCOUNTER — Encounter: Payer: Self-pay | Admitting: Family Medicine

## 2021-01-31 ENCOUNTER — Other Ambulatory Visit: Payer: Self-pay

## 2021-01-31 VITALS — BP 131/57 | HR 71 | Temp 96.5°F | Ht 63.0 in | Wt 192.8 lb

## 2021-01-31 DIAGNOSIS — E1140 Type 2 diabetes mellitus with diabetic neuropathy, unspecified: Secondary | ICD-10-CM | POA: Diagnosis not present

## 2021-01-31 DIAGNOSIS — I152 Hypertension secondary to endocrine disorders: Secondary | ICD-10-CM

## 2021-01-31 DIAGNOSIS — E785 Hyperlipidemia, unspecified: Secondary | ICD-10-CM | POA: Diagnosis not present

## 2021-01-31 DIAGNOSIS — Z794 Long term (current) use of insulin: Secondary | ICD-10-CM

## 2021-01-31 DIAGNOSIS — E1169 Type 2 diabetes mellitus with other specified complication: Secondary | ICD-10-CM

## 2021-01-31 DIAGNOSIS — E1159 Type 2 diabetes mellitus with other circulatory complications: Secondary | ICD-10-CM | POA: Diagnosis not present

## 2021-01-31 DIAGNOSIS — K5903 Drug induced constipation: Secondary | ICD-10-CM

## 2021-01-31 DIAGNOSIS — Z992 Dependence on renal dialysis: Secondary | ICD-10-CM | POA: Diagnosis not present

## 2021-01-31 DIAGNOSIS — F411 Generalized anxiety disorder: Secondary | ICD-10-CM

## 2021-01-31 DIAGNOSIS — N186 End stage renal disease: Secondary | ICD-10-CM | POA: Diagnosis not present

## 2021-01-31 LAB — BAYER DCA HB A1C WAIVED: HB A1C (BAYER DCA - WAIVED): 7.6 % — ABNORMAL HIGH (ref <7.0)

## 2021-01-31 MED ORDER — PRAVASTATIN SODIUM 40 MG PO TABS: 40.0000 mg | ORAL_TABLET | Freq: Every day | ORAL | 1 refills | Status: DC

## 2021-01-31 MED ORDER — BUSPIRONE HCL 7.5 MG PO TABS: 7.5000 mg | ORAL_TABLET | Freq: Three times a day (TID) | ORAL | 1 refills | Status: DC | PRN

## 2021-01-31 MED ORDER — BASAGLAR KWIKPEN 100 UNIT/ML ~~LOC~~ SOPN
20.0000 [IU] | PEN_INJECTOR | Freq: Every day | SUBCUTANEOUS | 1 refills | Status: DC
Start: 2021-01-31 — End: 2021-11-15

## 2021-01-31 MED ORDER — HYDRALAZINE HCL 50 MG PO TABS: 50.0000 mg | ORAL_TABLET | Freq: Two times a day (BID) | ORAL | 1 refills | Status: DC

## 2021-01-31 MED ORDER — PRAVASTATIN SODIUM 20 MG PO TABS: 20.0000 mg | ORAL_TABLET | Freq: Every day | ORAL | 1 refills | Status: DC

## 2021-01-31 MED ORDER — POLYETHYLENE GLYCOL 3350 17 GM/SCOOP PO POWD: 17.0000 g | Freq: Two times a day (BID) | ORAL | 5 refills | Status: DC | PRN

## 2021-01-31 MED ORDER — LINACLOTIDE 72 MCG PO CAPS: ORAL_CAPSULE | ORAL | 2 refills | Status: DC

## 2021-01-31 NOTE — Progress Notes (Signed)
Subjective:  Patient ID: [name],  female    DOB: [birth year]  Age: 70 y.o.    CC: Diabetes (3 month follow up)   HPI [name] presents for  follow-up of hypertension. Patient has no history of headache chest pain or shortness of breath or recent cough. Patient also denies symptoms of TIA such as numbness weakness lateralizing. Patient denies side effects from medication. States taking it regularly. Pt. Is in chronic dialysis. Weight has increased. Had tachycardia. They had to raise her dry weight as a result. She had been having hypotensive spells over the last six weeks.   Patient also  in for follow-up of elevated cholesterol. Doing well without complaints on current medication. Denies side effects  including myalgia and arthralgia and nausea. Also in today for liver function testing. Currently no chest pain, shortness of breath or other cardiovascular related symptoms noted.  Follow-up of diabetes. Patient does check blood sugar at home.  Patient denies symptoms such as excessive hunger or urinary frequency, excessive hunger, nausea No significant hypoglycemic spells noted. Medications reviewed. Pt reports taking them regularly. Pt. denies complication/adverse reaction today. Stopped trulicity due to nausea.  Linzess quit helping BMs after a month. Was too expensive anyway. Now using a stoolsoftener and laxative combination History Del has a past medical history of Acute on chronic diastolic CHF (congestive heart failure) ([place]) (03/05/2015), Anemia, Anxiety, Arthritis, CAD (coronary artery disease), Chronic kidney disease, Chronic neck pain, COPD (chronic obstructive pulmonary disease) ([place]), Degenerative joint disease, Diabetic neuropathy ([place]), Diverticulosis, Esophageal stricture, GERD (gastroesophageal reflux disease), Hyperlipidemia, Hypertension, IDDM (insulin dependent diabetes mellitus), Pericardial effusion (03/06/2015), Peripheral vascular disease ([place]), Stroke  ([place]), and Vitamin D deficiency.   She has a past surgical history that includes Bladder surgery; Partial hysterectomy; Shoulder surgery (Right); Cervical spine surgery (2012); Abdominal hysterectomy; Laminectomy (12/29/2011); cardiac stents; Lumbar laminectomy/decompression microdiscectomy (12/29/2011); Back surgery; Cystoscopy with injection (N/A, 06/03/2013); Cardiac catheterization (2003, 2005, 2009); Cataract extraction w/PHACO (Right, 05/04/2014); Cataract extraction w/PHACO (Left, 07/20/2014); AV fistula placement (Right, 07/20/2015); Cardiac catheterization (N/A, 11/22/2015); Insertion of dialysis catheter (N/A, 11/26/2015); AV fistula placement (Right, 11/26/2015); Fistulogram (Right, 10/20/2016); A/V Fistulagram (N/A, 08/10/2017); PERIPHERAL VASCULAR INTERVENTION (08/10/2017); A/V Fistulagram (N/A, 03/03/2020); and PERIPHERAL VASCULAR BALLOON ANGIOPLASTY (03/03/2020).   Her family history includes Cancer in her father; Diabetes in her daughter; Heart disease in an other family member; Hypertension in her daughter, daughter, father, and mother; Pancreatitis in her brother.She reports that she quit smoking about 19 years ago. She has a 30.00 pack-year smoking history. She has never used smokeless tobacco. She reports that she does not drink alcohol and does not use drugs.  Current Outpatient Medications on File Prior to Visit  Medication Sig Dispense Refill  . acetaminophen (TYLENOL) 500 MG tablet Take 1,000 mg by mouth every 6 (six) hours as needed for moderate pain or headache.    . albuterol (PROVENTIL) (2.5 MG/3ML) 0.083% nebulizer solution Take 3 mLs (2.5 mg total) by nebulization every 6 (six) hours as needed for wheezing or shortness of breath. 75 mL 12  . albuterol (VENTOLIN HFA) 108 (90 Base) MCG/ACT inhaler Inhale 2 puffs into the lungs every 6 (six) hours as needed for wheezing or shortness of breath. 1 each 2  . aspirin 81 MG EC tablet Take 81 mg by mouth daily.    [name] atenolol (TENORMIN) 50 MG  tablet Take 1 tablet (50 mg total) by mouth 2 (two) times daily. 180 tablet 1  . B Complex-C-Folic Acid (DIALYVITE  800) 0.8 MG TABS Take 1 tablet by mouth daily.    . Budeson-Glycopyrrol-Formoterol (BREZTRI AEROSPHERE) 160-9-4.8 MCG/ACT AERO Inhale 2 puffs into the lungs in the morning and at bedtime. 32.1 g 11  . calcium acetate (PHOSLO) 667 MG capsule Take 667-1,334 mg by mouth See admin instructions. Take 1334 mg with meals 3 times daily and 667 mg with snacks    . Carboxymethylcellul-Glycerin (LUBRICATING EYE DROPS OP) Place 1 drop into both eyes daily as needed (dry eyes).    . cetirizine (ZYRTEC) 10 MG tablet Take 1 tablet (10 mg total) by mouth daily as needed for allergies. 90 tablet 3  . gabapentin (NEURONTIN) 400 MG capsule Take two after each dialysis 45 capsule 2  . glucose blood (ONETOUCH ULTRA) test strip Use to test blood sugar 3 times daily as directed. DX: E11.9 300 each 12  . NIFEdipine (PROCARDIA XL/NIFEDICAL-XL) 90 MG 24 hr tablet Take 90 mg by mouth daily.     . nitroGLYCERIN (NITROSTAT) 0.4 MG SL tablet Place 1 tablet (0.4 mg total) under the tongue every 5 (five) minutes as needed for chest pain. 25 tablet 11  . ondansetron (ZOFRAN) 4 MG tablet TAKE 1 TABLET BY MOUTH EVERY 8 HOURS AS NEEDED FOR NAUSEA AND VOMITING 20 tablet 0  . QUEtiapine (SEROQUEL) 25 MG tablet Take 1 tablet (25 mg total) by mouth at bedtime. For sleep and for anxiety 90 tablet 1  . Tiotropium Bromide Monohydrate (SPIRIVA RESPIMAT) 1.25 MCG/ACT AERS Inhale 2 puffs into the lungs daily. 8 g 0  . torsemide (DEMADEX) 20 MG tablet Take 40 mg by mouth daily.     [name] triamcinolone cream (KENALOG) 0.1 % Apply 1 application topically 3 (three) times daily. Avoid face and genitalia (Patient taking differently: Apply 1 application topically 3 (three) times daily as needed (rash). Avoid face and genitalia) 45 g 0   No current facility-administered medications on file prior to visit.    ROS Review of Systems   Constitutional: Negative for fever.  HENT: Negative for congestion, rhinorrhea and sore throat.   Respiratory: Negative for cough and shortness of breath.   Cardiovascular: Negative for chest pain and palpitations.  Gastrointestinal: Negative for abdominal pain.  Musculoskeletal: Negative for arthralgias and myalgias.    Objective:  BP (!) 131/57   Pulse 71   Temp (!) 96.5 F (35.8 C) (Temporal)   Ht '5\' 3"'  (1.6 m)   Wt 192 lb 12.8 oz (87.5 kg)   SpO2 99%   BMI 34.15 kg/m   BP Readings from Last 3 Encounters:  01/31/21 (!) 131/57  12/27/20 129/71  12/15/20 118/76    Wt Readings from Last 3 Encounters:  01/31/21 192 lb 12.8 oz (87.5 kg)  12/27/20 187 lb 12.8 oz (85.2 kg)  12/15/20 183 lb 12.8 oz (83.4 kg)     Physical Exam Constitutional:      General: She is not in acute distress.    Appearance: She is well-developed.  HENT:     Head: Normocephalic and atraumatic.  Eyes:     Conjunctiva/sclera: Conjunctivae normal.     Pupils: Pupils are equal, round, and reactive to light.  Neck:     Thyroid: No thyromegaly.  Cardiovascular:     Rate and Rhythm: Normal rate and regular rhythm.     Heart sounds: Normal heart sounds. No murmur heard.   Pulmonary:     Effort: Pulmonary effort is normal. No respiratory distress.     Breath sounds: Normal breath sounds. No  wheezing or rales.  Abdominal:     General: Bowel sounds are normal. There is no distension.     Palpations: Abdomen is soft.     Tenderness: There is no abdominal tenderness.  Musculoskeletal:        General: Normal range of motion.     Cervical back: Normal range of motion and neck supple.  Lymphadenopathy:     Cervical: No cervical adenopathy.  Skin:    General: Skin is warm and dry.  Neurological:     Mental Status: She is alert and oriented to person, place, and time.  Psychiatric:        Behavior: Behavior normal.        Thought Content: Thought content normal.        Judgment: Judgment normal.      Diabetic Foot Exam - Simple   Simple Foot Form Diabetic Foot exam was performed with the following findings: Yes 01/31/2021 10:29 AM  Visual Inspection No deformities, no ulcerations, no other skin breakdown bilaterally: Yes Sensation Testing Intact to touch and monofilament testing bilaterally: Yes Pulse Check Posterior Tibialis and Dorsalis pulse intact bilaterally: Yes Comments       Assessment & Plan:   Adahlia was seen today for diabetes.  Diagnoses and all orders for this visit:  Type 2 diabetes mellitus with diabetic neuropathy, with long-term current use of insulin ([place]) -     Microalbumin / creatinine urine ratio -     Bayer DCA Hb A1c Waived -     CBC with Differential/Platelet -     CMP14+EGFR -     Lipid panel  Hyperlipidemia associated with type 2 diabetes mellitus (HCC) -     CBC with Differential/Platelet -     CMP14+EGFR -     Lipid panel -     Discontinue: pravastatin (PRAVACHOL) 40 MG tablet; Take 1 tablet (40 mg total) by mouth daily. -     pravastatin (PRAVACHOL) 20 MG tablet; Take 1 tablet (20 mg total) by mouth daily.  Hypertension associated with diabetes ([place]) -     CBC with Differential/Platelet -     CMP14+EGFR -     Lipid panel -     hydrALAZINE (APRESOLINE) 50 MG tablet; Take 1 tablet (50 mg total) by mouth 2 (two) times daily.  Drug-induced constipation -     linaclotide (LINZESS) 72 MCG capsule; One daily on days you do NOT have dialysis  GAD (generalized anxiety disorder) -     busPIRone (BUSPAR) 7.5 MG tablet; Take 1 tablet (7.5 mg total) by mouth 3 (three) times daily as needed.  Other orders -     polyethylene glycol powder (GLYCOLAX/MIRALAX) 17 GM/SCOOP powder; Take 17 g by mouth 2 (two) times daily as needed for moderate constipation. For constipation -     Insulin Glargine (BASAGLAR KWIKPEN) 100 UNIT/ML; Inject 20 Units into the skin at bedtime. Add five units each time the fasting is over 125 three days in a row.   I have  discontinued Esma Kilts. Peto's pravastatin. I have also changed her busPIRone, pravastatin, and Basaglar KwikPen. Additionally, I am having her start on polyethylene glycol powder. Lastly, I am having her maintain her aspirin, calcium acetate, torsemide, nitroGLYCERIN, acetaminophen, Dialyvite 800, NIFEdipine, triamcinolone, Carboxymethylcellul-Glycerin (LUBRICATING EYE DROPS OP), albuterol, OneTouch Ultra, cetirizine, atenolol, QUEtiapine, Breztri Aerosphere, Spiriva Respimat, albuterol, gabapentin, ondansetron, linaclotide, and hydrALAZINE.  Meds ordered this encounter  Medications  . linaclotide (LINZESS) 72 MCG capsule    Sig: One  daily on days you do NOT have dialysis    Dispense:  30 capsule    Refill:  2  . busPIRone (BUSPAR) 7.5 MG tablet    Sig: Take 1 tablet (7.5 mg total) by mouth 3 (three) times daily as needed.    Dispense:  90 tablet    Refill:  1  . DISCONTD: pravastatin (PRAVACHOL) 40 MG tablet    Sig: Take 1 tablet (40 mg total) by mouth daily.    Dispense:  90 tablet    Refill:  1  . hydrALAZINE (APRESOLINE) 50 MG tablet    Sig: Take 1 tablet (50 mg total) by mouth 2 (two) times daily.    Dispense:  180 tablet    Refill:  1  . polyethylene glycol powder (GLYCOLAX/MIRALAX) 17 GM/SCOOP powder    Sig: Take 17 g by mouth 2 (two) times daily as needed for moderate constipation. For constipation    Dispense:  3350 g    Refill:  5  . pravastatin (PRAVACHOL) 20 MG tablet    Sig: Take 1 tablet (20 mg total) by mouth daily.    Dispense:  90 tablet    Refill:  1  . Insulin Glargine (BASAGLAR KWIKPEN) 100 UNIT/ML    Sig: Inject 20 Units into the skin at bedtime. Add five units each time the fasting is over 125 three days in a row.    Dispense:  30 mL    Refill:  1     Follow-up: Return in about 1 month (around 02/28/2021).  [name], M.D.

## 2021-02-01 DIAGNOSIS — E119 Type 2 diabetes mellitus without complications: Secondary | ICD-10-CM | POA: Diagnosis not present

## 2021-02-01 DIAGNOSIS — N2581 Secondary hyperparathyroidism of renal origin: Secondary | ICD-10-CM | POA: Diagnosis not present

## 2021-02-01 DIAGNOSIS — D631 Anemia in chronic kidney disease: Secondary | ICD-10-CM | POA: Diagnosis not present

## 2021-02-01 DIAGNOSIS — Z992 Dependence on renal dialysis: Secondary | ICD-10-CM | POA: Diagnosis not present

## 2021-02-01 DIAGNOSIS — N186 End stage renal disease: Secondary | ICD-10-CM | POA: Diagnosis not present

## 2021-02-01 DIAGNOSIS — Z794 Long term (current) use of insulin: Secondary | ICD-10-CM | POA: Diagnosis not present

## 2021-02-01 DIAGNOSIS — D509 Iron deficiency anemia, unspecified: Secondary | ICD-10-CM | POA: Diagnosis not present

## 2021-02-01 LAB — CMP14+EGFR
ALT: 12 IU/L (ref 0–32)
AST: 13 IU/L (ref 0–40)
Albumin/Globulin Ratio: 1.6 (ref 1.2–2.2)
Albumin: 3.9 g/dL (ref 3.8–4.8)
Alkaline Phosphatase: 96 IU/L (ref 44–121)
BUN/Creatinine Ratio: 7 — ABNORMAL LOW (ref 12–28)
BUN: 69 mg/dL — ABNORMAL HIGH (ref 8–27)
Bilirubin Total: 0.2 mg/dL (ref 0.0–1.2)
CO2: 21 mmol/L (ref 20–29)
Calcium: 9.4 mg/dL (ref 8.7–10.3)
Chloride: 96 mmol/L (ref 96–106)
Creatinine, Ser: 10.35 mg/dL (ref 0.57–1.00)
Globulin, Total: 2.4 g/dL (ref 1.5–4.5)
Glucose: 292 mg/dL — ABNORMAL HIGH (ref 65–99)
Potassium: 4.7 mmol/L (ref 3.5–5.2)
Sodium: 138 mmol/L (ref 134–144)
Total Protein: 6.3 g/dL (ref 6.0–8.5)
eGFR: 4 mL/min/{1.73_m2} — ABNORMAL LOW (ref >59)

## 2021-02-01 LAB — CBC WITH DIFFERENTIAL/PLATELET
Basophils Absolute: 0.1 10*3/uL (ref 0.0–0.2)
Basos: 1 %
EOS (ABSOLUTE): 1.2 10*3/uL — ABNORMAL HIGH (ref 0.0–0.4)
Eos: 10 %
Hematocrit: 31.2 % — ABNORMAL LOW (ref 34.0–46.6)
Hemoglobin: 10.1 g/dL — ABNORMAL LOW (ref 11.1–15.9)
Immature Grans (Abs): 0 10*3/uL (ref 0.0–0.1)
Immature Granulocytes: 0 %
Lymphocytes Absolute: 2.8 10*3/uL (ref 0.7–3.1)
Lymphs: 23 %
MCH: 29.4 pg (ref 26.6–33.0)
MCHC: 32.4 g/dL (ref 31.5–35.7)
MCV: 91 fL (ref 79–97)
Monocytes Absolute: 0.9 10*3/uL (ref 0.1–0.9)
Monocytes: 8 %
Neutrophils Absolute: 7.2 10*3/uL — ABNORMAL HIGH (ref 1.4–7.0)
Neutrophils: 58 %
Platelets: 263 10*3/uL (ref 150–450)
RBC: 3.44 x10E6/uL — ABNORMAL LOW (ref 3.77–5.28)
RDW: 14.4 % (ref 11.7–15.4)
WBC: 12.2 10*3/uL — ABNORMAL HIGH (ref 3.4–10.8)

## 2021-02-01 LAB — LIPID PANEL
Chol/HDL Ratio: 5.6 ratio — ABNORMAL HIGH (ref 0.0–4.4)
Cholesterol, Total: 203 mg/dL — ABNORMAL HIGH (ref 100–199)
HDL: 36 mg/dL — ABNORMAL LOW (ref >39)
LDL Chol Calc (NIH): 124 mg/dL — ABNORMAL HIGH (ref 0–99)
Triglycerides: 240 mg/dL — ABNORMAL HIGH (ref 0–149)
VLDL Cholesterol Cal: 43 mg/dL — ABNORMAL HIGH (ref 5–40)

## 2021-02-01 LAB — MICROALBUMIN / CREATININE URINE RATIO
Creatinine, Urine: 103.3 mg/dL
Microalb/Creat Ratio: 159 mg/g creat — ABNORMAL HIGH (ref 0–29)
Microalbumin, Urine: 164.7 ug/mL

## 2021-02-02 ENCOUNTER — Other Ambulatory Visit: Payer: Self-pay

## 2021-02-02 ENCOUNTER — Ambulatory Visit (HOSPITAL_COMMUNITY)
Admission: RE | Admit: 2021-02-02 | Discharge: 2021-02-02 | Disposition: A | Discharge status: Home / Self Care | Payer: Medicare Other | Source: Ambulatory Visit | Attending: Pulmonary Disease | Admitting: Pulmonary Disease

## 2021-02-02 DIAGNOSIS — R0602 Shortness of breath: Secondary | ICD-10-CM | POA: Insufficient documentation

## 2021-02-04 ENCOUNTER — Other Ambulatory Visit: Payer: Self-pay | Admitting: Family Medicine

## 2021-02-04 MED ORDER — ROSUVASTATIN CALCIUM 5 MG PO TABS: 5.0000 mg | ORAL_TABLET | Freq: Every day | ORAL | 3 refills | Status: DC

## 2021-02-18 DIAGNOSIS — N186 End stage renal disease: Secondary | ICD-10-CM | POA: Diagnosis not present

## 2021-02-18 DIAGNOSIS — R42 Dizziness and giddiness: Secondary | ICD-10-CM | POA: Diagnosis not present

## 2021-02-18 DIAGNOSIS — R110 Nausea: Secondary | ICD-10-CM | POA: Diagnosis not present

## 2021-02-18 DIAGNOSIS — Z6831 Body mass index (BMI) 31.0-31.9, adult: Secondary | ICD-10-CM | POA: Diagnosis not present

## 2021-02-19 ENCOUNTER — Encounter (HOSPITAL_COMMUNITY): Payer: Self-pay | Admitting: Emergency Medicine

## 2021-02-19 ENCOUNTER — Emergency Department (HOSPITAL_COMMUNITY): Payer: Medicare Other

## 2021-02-19 ENCOUNTER — Other Ambulatory Visit: Payer: Self-pay

## 2021-02-19 ENCOUNTER — Observation Stay (HOSPITAL_COMMUNITY)
Admission: EM | Admit: 2021-02-19 | Discharge: 2021-02-21 | Disposition: A | Discharge status: Home / Self Care | Payer: Medicare Other | Attending: Internal Medicine | Admitting: Internal Medicine

## 2021-02-19 DIAGNOSIS — Z992 Dependence on renal dialysis: Secondary | ICD-10-CM | POA: Diagnosis not present

## 2021-02-19 DIAGNOSIS — J449 Chronic obstructive pulmonary disease, unspecified: Secondary | ICD-10-CM | POA: Diagnosis not present

## 2021-02-19 DIAGNOSIS — I132 Hypertensive heart and chronic kidney disease with heart failure and with stage 5 chronic kidney disease, or end stage renal disease: Secondary | ICD-10-CM | POA: Insufficient documentation

## 2021-02-19 DIAGNOSIS — I5032 Chronic diastolic (congestive) heart failure: Secondary | ICD-10-CM | POA: Diagnosis present

## 2021-02-19 DIAGNOSIS — E785 Hyperlipidemia, unspecified: Secondary | ICD-10-CM | POA: Diagnosis present

## 2021-02-19 DIAGNOSIS — N186 End stage renal disease: Secondary | ICD-10-CM | POA: Insufficient documentation

## 2021-02-19 DIAGNOSIS — R0789 Other chest pain: Secondary | ICD-10-CM | POA: Diagnosis not present

## 2021-02-19 DIAGNOSIS — I5033 Acute on chronic diastolic (congestive) heart failure: Secondary | ICD-10-CM | POA: Diagnosis present

## 2021-02-19 DIAGNOSIS — E1140 Type 2 diabetes mellitus with diabetic neuropathy, unspecified: Secondary | ICD-10-CM | POA: Diagnosis present

## 2021-02-19 DIAGNOSIS — R42 Dizziness and giddiness: Secondary | ICD-10-CM | POA: Diagnosis not present

## 2021-02-19 DIAGNOSIS — R079 Chest pain, unspecified: Secondary | ICD-10-CM | POA: Diagnosis not present

## 2021-02-19 DIAGNOSIS — Z7982 Long term (current) use of aspirin: Secondary | ICD-10-CM | POA: Insufficient documentation

## 2021-02-19 DIAGNOSIS — E1165 Type 2 diabetes mellitus with hyperglycemia: Secondary | ICD-10-CM | POA: Diagnosis present

## 2021-02-19 DIAGNOSIS — I200 Unstable angina: Secondary | ICD-10-CM | POA: Diagnosis not present

## 2021-02-19 DIAGNOSIS — D631 Anemia in chronic kidney disease: Secondary | ICD-10-CM | POA: Diagnosis present

## 2021-02-19 DIAGNOSIS — Z20822 Contact with and (suspected) exposure to covid-19: Secondary | ICD-10-CM | POA: Insufficient documentation

## 2021-02-19 DIAGNOSIS — R110 Nausea: Secondary | ICD-10-CM | POA: Diagnosis not present

## 2021-02-19 DIAGNOSIS — I2510 Atherosclerotic heart disease of native coronary artery without angina pectoris: Secondary | ICD-10-CM | POA: Insufficient documentation

## 2021-02-19 DIAGNOSIS — E8809 Other disorders of plasma-protein metabolism, not elsewhere classified: Secondary | ICD-10-CM

## 2021-02-19 DIAGNOSIS — I10 Essential (primary) hypertension: Secondary | ICD-10-CM | POA: Diagnosis present

## 2021-02-19 DIAGNOSIS — Z79899 Other long term (current) drug therapy: Secondary | ICD-10-CM | POA: Diagnosis not present

## 2021-02-19 DIAGNOSIS — Z87891 Personal history of nicotine dependence: Secondary | ICD-10-CM | POA: Insufficient documentation

## 2021-02-19 DIAGNOSIS — I25118 Atherosclerotic heart disease of native coronary artery with other forms of angina pectoris: Secondary | ICD-10-CM | POA: Diagnosis present

## 2021-02-19 DIAGNOSIS — D638 Anemia in other chronic diseases classified elsewhere: Secondary | ICD-10-CM | POA: Diagnosis present

## 2021-02-19 DIAGNOSIS — Z794 Long term (current) use of insulin: Secondary | ICD-10-CM | POA: Diagnosis not present

## 2021-02-19 DIAGNOSIS — E782 Mixed hyperlipidemia: Secondary | ICD-10-CM | POA: Diagnosis present

## 2021-02-19 DIAGNOSIS — E1122 Type 2 diabetes mellitus with diabetic chronic kidney disease: Secondary | ICD-10-CM

## 2021-02-19 DIAGNOSIS — I517 Cardiomegaly: Secondary | ICD-10-CM | POA: Diagnosis not present

## 2021-02-19 LAB — BASIC METABOLIC PANEL
Anion gap: 15 (ref 5–15)
BUN: 48 mg/dL — ABNORMAL HIGH (ref 8–23)
CO2: 27 mmol/L (ref 22–32)
Calcium: 9.3 mg/dL (ref 8.9–10.3)
Chloride: 93 mmol/L — ABNORMAL LOW (ref 98–111)
Creatinine, Ser: 9.62 mg/dL — ABNORMAL HIGH (ref 0.44–1.00)
GFR, Estimated: 4 mL/min — ABNORMAL LOW (ref >60)
Glucose, Bld: 269 mg/dL — ABNORMAL HIGH (ref 70–99)
Potassium: 4.5 mmol/L (ref 3.5–5.1)
Sodium: 135 mmol/L (ref 135–145)

## 2021-02-19 LAB — CBC
HCT: 30.7 % — ABNORMAL LOW (ref 36.0–46.0)
Hemoglobin: 9.9 g/dL — ABNORMAL LOW (ref 12.0–15.0)
MCH: 30.6 pg (ref 26.0–34.0)
MCHC: 32.2 g/dL (ref 30.0–36.0)
MCV: 94.8 fL (ref 80.0–100.0)
Platelets: 226 10*3/uL (ref 150–400)
RBC: 3.24 MIL/uL — ABNORMAL LOW (ref 3.87–5.11)
RDW: 14.6 % (ref 11.5–15.5)
WBC: 11.8 10*3/uL — ABNORMAL HIGH (ref 4.0–10.5)
nRBC: 0 % (ref 0.0–0.2)

## 2021-02-19 LAB — HEPATIC FUNCTION PANEL
ALT: 21 U/L (ref 0–44)
AST: 17 U/L (ref 15–41)
Albumin: 3.3 g/dL — ABNORMAL LOW (ref 3.5–5.0)
Alkaline Phosphatase: 87 U/L (ref 38–126)
Bilirubin, Direct: <0.1 mg/dL (ref 0.0–0.2)
Total Bilirubin: 0.2 mg/dL — ABNORMAL LOW (ref 0.3–1.2)
Total Protein: 6.8 g/dL (ref 6.5–8.1)

## 2021-02-19 LAB — TROPONIN I (HIGH SENSITIVITY)
Troponin I (High Sensitivity): 58 ng/L — ABNORMAL HIGH (ref <18)
Troponin I (High Sensitivity): 60 ng/L — ABNORMAL HIGH (ref <18)

## 2021-02-19 MED ORDER — ENOXAPARIN SODIUM 30 MG/0.3ML ~~LOC~~ SOLN
30.0000 mg | SUBCUTANEOUS | Status: DC
  Administered 2021-02-20 (×2): 30 mg via SUBCUTANEOUS
  Filled 2021-02-19 (×2): qty 0.3

## 2021-02-19 MED ORDER — ONDANSETRON HCL 4 MG/2ML IJ SOLN
4.0000 mg | Freq: Four times a day (QID) | INTRAMUSCULAR | Status: DC | PRN
  Administered 2021-02-20 – 2021-02-21 (×2): 4 mg via INTRAVENOUS
  Filled 2021-02-19 (×2): qty 2

## 2021-02-19 MED ORDER — ACETAMINOPHEN 325 MG PO TABS
650.0000 mg | ORAL_TABLET | ORAL | Status: DC | PRN
  Administered 2021-02-20 – 2021-02-21 (×3): 650 mg via ORAL
  Filled 2021-02-19 (×3): qty 2

## 2021-02-19 MED ORDER — ASPIRIN 81 MG PO CHEW
324.0000 mg | CHEWABLE_TABLET | Freq: Once | ORAL | Status: AC
  Administered 2021-02-19: 324 mg via ORAL
  Filled 2021-02-19: qty 4

## 2021-02-19 MED ORDER — QUETIAPINE FUMARATE 25 MG PO TABS
25.0000 mg | ORAL_TABLET | Freq: Every day | ORAL | Status: DC
  Administered 2021-02-20 (×2): 25 mg via ORAL
  Filled 2021-02-19 (×2): qty 1

## 2021-02-19 MED ORDER — ENOXAPARIN SODIUM 30 MG/0.3ML ~~LOC~~ SOLN: 30.0000 mg | SUBCUTANEOUS | Status: DC

## 2021-02-19 MED ORDER — NITROGLYCERIN 2 % TD OINT
1.0000 [in_us] | TOPICAL_OINTMENT | Freq: Four times a day (QID) | TRANSDERMAL | Status: AC
  Administered 2021-02-20 (×2): 1 [in_us] via TOPICAL
  Filled 2021-02-19 (×2): qty 1

## 2021-02-19 MED ORDER — NITROGLYCERIN 0.4 MG SL SUBL
0.4000 mg | SUBLINGUAL_TABLET | Freq: Once | SUBLINGUAL | Status: AC
  Administered 2021-02-19: 0.4 mg via SUBLINGUAL
  Filled 2021-02-19: qty 1

## 2021-02-19 NOTE — H&P (Signed)
History and Physical    [name] [id number] DOB: 1951-01-28 DOA: 02/19/2021  PCP: [name], MD  Patient coming from: Home.  I have personally briefly reviewed patient's old medical records in [place]  Chief Complaint: Chest pain.  HPI: [name] is a 70 y.o. 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  The patient stated that she had occasional angina in the past, which usually was not very symptomatic and resolved with rest.  However, since earlier this week she started having more pronounced pain that was lasting longer associated with dyspnea and palpitations.  She did not feel well during her hemodialysis session on Tuesday due to chest pain with hypotension.  She stopped taking all her blood pressure medications since Wednesday evening in anticipation of her HD session on Thursday, except for the torsemide.  However, despite holding the antihypertensives, she again had chest tightness with some radiation to her back associated with dyspnea, dizziness and nausea.  Then, on today's HD session, her chest tightness was a lot more pronounced with hypotension, tachycardia, diaphoresis, dyspnea, palpitations and nausea.  Her HD session was finished before she completed the third hour.  She denies PND, orthopnea or lower extremity edema.  She denies fever, chills, but feels fatigued.  No rhinorrhea, sore throat, wheezing or hemoptysis.  Denies abdominal pain, emesis, diarrhea, constipation, melena or hematochezia.  No dysuria, frequency or  hematuria.  No polyuria, polydipsia, polyphagia or blurred vision.  ED Course: Initial vital signs were temperature 98.4 F, pulse 83, respirations 20, BP 145/59 mmHg O2 sat 100% on room air.  The patient received 324 mg of chewable aspirin and sublingual nitroglycerin in the emergency department.  Labwork: CBC showed a white count of 11.8, hemoglobin 9.9 g/dL and platelets 226.  BMP with normal electrolytes, except for chloride 93 mmol/L.  Glucose 269, BUN [place] and creatinine 9.62 mg/dL.  Hepatic functions with an albumin level of 3.3 g/dL and total bilirubin of 0.2 mg/dL.  All other values were normal.  Troponin was 58 and then 60 ng/L.  Imaging: Her chest radiograph showed cardiomegaly, aortic arch calcification, but clear lungs without pneumothorax or large pleural effusion.  Please see images and full cardiology report for further detail.  Review of Systems: As per HPI otherwise all other systems reviewed and are negative.  Past Medical History:  Diagnosis Date  . Acute on chronic diastolic CHF (congestive heart failure) ([place]) 03/05/2015   Grade 2. EF 60-65%  . Anemia    hx of  . Anxiety   . Arthritis   . CAD (coronary artery disease)    s/p Stenting in 2005;  [place] 7/09: Vigorous LV function, 2-3+ MR, RI 40%, RI stent patent, proximal-mid RCA 40-50%;  Echo 7/09:  EF 55-65%, trivial MR;  [place] 11/18/12: EF 66%, normal LV wall motion, mild to moderate anterior ischemia - reviewed by [facility] and felt to rep breast atten (low risk);  Echo 6/14: mild LVH, EF 55-65%, Gr 2 DD, PASP 44, trivial eff   . Chronic kidney disease    CREATININE IS UP--GOING TO KIDNEY MD   . Chronic neck pain   . COPD (chronic obstructive  pulmonary disease) ([place])   . Degenerative joint disease    left shoulder  . Diabetic neuropathy ([place])   . Diverticulosis   . Esophageal stricture   . GERD (gastroesophageal reflux disease)   . Hyperlipidemia   . Hypertension   . IDDM (insulin dependent diabetes mellitus)   .  Pericardial effusion 03/06/2015   Very small per echo ; pleural nodules seen on CT chest.  . Peripheral vascular disease ([place])   . Stroke [facility])    "mini- years ago"  . Vitamin D deficiency    Past Surgical History:  Procedure Laterality Date  . A/V FISTULAGRAM N/A 08/10/2017   Procedure: A/V Fistulagram - Right Arm;  Surgeon: Elam Dutch, MD;  Location: [place] CV LAB;  Service: Cardiovascular;  Laterality: N/A;  . A/V FISTULAGRAM N/A 03/03/2020   Procedure: A/V FISTULAGRAM - Right Arm;  Surgeon: Marty Heck, MD;  Location: [place] CV LAB;  Service: Cardiovascular;  Laterality: N/A;  . ABDOMINAL HYSTERECTOMY    . AV FISTULA PLACEMENT Right 07/20/2015   Procedure: RADIAL CEPHALIC ARTERIOVENOUS FISTULA CREATION RIGHT ARM;  Surgeon: Angelia Mould, MD;  Location: [place];  Service: Vascular;  Laterality: Right;  . AV FISTULA PLACEMENT Right 11/26/2015   Procedure:  Creation of RIGHT BRACHIO-CEPHALIC arteriovenous fistula;  Surgeon: Angelia Mould, MD;  Location: [place];  Service: Vascular;  Laterality: Right;  . BACK SURGERY     2007 & 2013  . BLADDER SURGERY     Tack  . CARDIAC CATHETERIZATION  2003, 2005, 2009   1 stent  . cardiac stents    . CATARACT EXTRACTION W/PHACO Right 05/04/2014   Procedure: CATARACT EXTRACTION PHACO AND INTRAOCULAR LENS PLACEMENT (IOC);  Surgeon: Williams Che, MD;  [facility];  Service: Ophthalmology;  Laterality: Right;  CDE 1.47  . CATARACT EXTRACTION W/PHACO Left 07/20/2014   Procedure: CATARACT EXTRACTION WITH PHACO AND INTRAOCULAR LENS PLACEMENT LEFT EYE CDE=1.79;  Surgeon: Williams Che, MD;  Location: [facility];  Service: Ophthalmology;  Laterality: Left;  . CERVICAL SPINE SURGERY  2012   8 screws  . CYSTOSCOPY WITH INJECTION N/A 06/03/2013   Procedure: MACROPLASTIQUE  INJECTION ;  Surgeon: Malka So, MD;  Location: [facility];  Service: Urology;  Laterality: N/A;  . FISTULOGRAM Right 10/20/2016   Procedure: FISTULOGRAM  WITH POSSIBLE INTERVENTION;  Surgeon: [name], MD;  Location: [facility];  Service: Vascular;  Laterality: Right;  . INSERTION OF DIALYSIS CATHETER N/A 11/26/2015   Procedure: INSERTION OF DIALYSIS CATHETER;  Surgeon: Angelia Mould, MD;  Location: [place];  Service: Vascular;  Laterality: N/A;  . LAMINECTOMY  12/29/2011  . LUMBAR LAMINECTOMY/DECOMPRESSION MICRODISCECTOMY  12/29/2011   Procedure: LUMBAR LAMINECTOMY/DECOMPRESSION MICRODISCECTOMY;  Surgeon: Floyce Stakes, MD;  Location: [place] [facility];  Service: Neurosurgery;  Laterality: N/A;  Lumbar Two through Lumbar Five Laminectomies/Cellsaver  . PARTIAL HYSTERECTOMY    . PERIPHERAL VASCULAR BALLOON ANGIOPLASTY  03/03/2020   Procedure: PERIPHERAL VASCULAR BALLOON ANGIOPLASTY;  Surgeon: Marty Heck, MD;  Location: [place] CV LAB;  Service: Cardiovascular;;  Rt arm fistula  . PERIPHERAL VASCULAR CATHETERIZATION N/A 11/22/2015   Procedure: Fistulagram;  Surgeon: Angelia Mould, MD;  Location: [place] CV LAB;  Service: Cardiovascular;  Laterality: N/A;  . PERIPHERAL VASCULAR INTERVENTION  08/10/2017   Procedure: PERIPHERAL VASCULAR INTERVENTION;  Surgeon: Elam Dutch, MD;  Location: [place] CV LAB;  Service: Cardiovascular;;  . SHOULDER SURGERY Right    Rotator  cuff   Social History  reports that she quit smoking about 19 years ago. She has a 30.00 pack-year smoking history. She has never used smokeless tobacco. She reports that she does not drink alcohol and does not use drugs.  Allergies  Allergen Reactions  . Azithromycin     Prolonged QT  . Ace Inhibitors Cough  . Clopidogrel Bisulfate Other (See Comments)    Sick, Headache, "Felt terrible"  . Codeine Other (See Comments)    hallucinations  . Lisinopril Cough  . Penicillins Other (See Comments)    Unknown reaction Did it involve swelling of the face/tongue/throat, SOB, or low BP? Unknown Did it involve sudden or severe rash/hives, skin  peeling, or any reaction on the inside of your mouth or nose? Unknown Did you need to seek medical attention at a hospital or doctor's office? Unknown When did it last happen?childhood allergy If all above answers are "NO", may proceed with cephalosporin use.     Family History  Problem Relation Age of Onset  . Heart disease Other   . Hypertension Father   . Cancer Father   . Hypertension Mother   . Hypertension Daughter   . Pancreatitis Brother   . Hypertension Daughter   . Diabetes Daughter    Prior to Admission medications   Medication Sig Start Date End Date Taking? Authorizing Provider  acetaminophen (TYLENOL) 500 MG tablet Take 1,000 mg by mouth every 6 (six) hours as needed for moderate pain or headache.    [provider]  albuterol (PROVENTIL) (2.5 MG/3ML) 0.083% nebulizer solution Take 3 mLs (2.5 mg total) by nebulization every 6 (six) hours as needed for wheezing or shortness of breath. 08/30/20   Hassell Done Mary-Margaret, FNP  albuterol (VENTOLIN HFA) 108 (90 Base) MCG/ACT inhaler Inhale 2 puffs into the lungs every 6 (six) hours as needed for wheezing or shortness of breath. 12/22/20   Parrett, Fonnie Mu, NP  aspirin 81 MG EC tablet Take 81 mg by mouth daily.    [provider]  atenolol (TENORMIN) 50 MG tablet Take 1 tablet (50 mg total) by mouth 2 (two) times daily. 11/01/20   [name], MD  B Complex-C-Folic Acid (DIALYVITE Q000111Q) 0.8 MG TABS Take 1 tablet by mouth daily.    [provider]  Budeson-Glycopyrrol-Formoterol (BREZTRI AEROSPHERE) 160-9-4.8 MCG/ACT AERO Inhale 2 puffs into the lungs in the morning and at bedtime. 12/13/20   [name], MD  busPIRone (BUSPAR) 7.5 MG tablet Take 1 tablet (7.5 mg total) by mouth 3 (three) times daily as needed. 01/31/21   [name], MD  calcium acetate (PHOSLO) 667 MG capsule Take 667-1,334 mg by mouth See admin instructions. Take 1334 mg with meals 3 times daily and 667 mg with snacks     [provider]  Carboxymethylcellul-Glycerin (LUBRICATING EYE DROPS OP) Place 1 drop into both eyes daily as needed (dry eyes).    [provider]  cetirizine (ZYRTEC) 10 MG tablet Take 1 tablet (10 mg total) by mouth daily as needed for allergies. 11/01/20   [name], MD  gabapentin (NEURONTIN) 400 MG capsule Take two after each dialysis 12/27/20   [name], MD  glucose blood (ONETOUCH ULTRA) test strip Use to test blood sugar 3 times daily as directed. DX: E11.9 09/13/20   [name], MD  hydrALAZINE (APRESOLINE) 50 MG tablet Take 1 tablet (50 mg total) by mouth 2 (two) times daily. 01/31/21 03/02/21  [name], MD  Insulin Glargine [facility]) 100 UNIT/ML  Inject 20 Units into the skin at bedtime. Add five units each time the fasting is over 125 three days in a row. 01/31/21   [name], MD  linaclotide Rolan Lipa) 72 MCG capsule One daily on days you do NOT have dialysis 01/31/21   [name], MD  NIFEdipine (PROCARDIA XL/NIFEDICAL-XL) 90 MG 24 hr tablet Take 90 mg by mouth daily.  04/25/20   [provider]  nitroGLYCERIN (NITROSTAT) 0.4 MG SL tablet Place 1 tablet (0.4 mg total) under the tongue every 5 (five) minutes as needed for chest pain. 01/07/20   Minus Breeding, MD  ondansetron (ZOFRAN) 4 MG tablet TAKE 1 TABLET BY MOUTH EVERY 8 HOURS AS NEEDED FOR NAUSEA AND VOMITING 01/03/21   [name], MD  polyethylene glycol powder (GLYCOLAX/MIRALAX) 17 GM/SCOOP powder Take 17 g by mouth 2 (two) times daily as needed for moderate constipation. For constipation 01/31/21   [name], MD  QUEtiapine (SEROQUEL) 25 MG tablet Take 1 tablet (25 mg total) by mouth at bedtime. For sleep and for anxiety 11/01/20   [name], MD  rosuvastatin (CRESTOR) 5 MG tablet Take 1 tablet (5 mg total) by mouth daily. For cholesterol 02/04/21   [name], MD  Tiotropium Bromide Monohydrate (SPIRIVA RESPIMAT) 1.25 MCG/ACT AERS Inhale 2 puffs into the  lungs daily. 12/15/20   Freddi Starr, MD  torsemide (DEMADEX) 20 MG tablet Take 40 mg by mouth daily.     [provider]  triamcinolone cream (KENALOG) 0.1 % Apply 1 application topically 3 (three) times daily. Avoid face and genitalia Patient taking differently: Apply 1 application topically 3 (three) times daily as needed (rash). Avoid face and genitalia 06/09/20   [name], MD   Physical Exam: Vitals:   02/19/21 2200 02/19/21 2230 02/19/21 2300 02/19/21 2351  BP: (!) 154/65 (!) 124/53 (!) 171/66 (!) 156/58  Pulse: 76 90 74 100  Resp: '20 13 15 19  '$ Temp:    97.9 F (36.6 C)  TempSrc:      SpO2: 100% 96% 100% 100%  Weight:      Height:       Constitutional: Looks chronically ill.  NAD, calm, comfortable Eyes: PERRL, lids and conjunctivae normal.  Sclera mildly injected. ENMT: Mucous membranes are moist. Posterior pharynx clear of any exudate or lesions. Neck: normal, supple, no masses, no thyromegaly Respiratory: clear to auscultation bilaterally, no wheezing, no crackles. Normal respiratory effort. No accessory muscle use.  Cardiovascular: Regular rate and rhythm, occasional extrasystole, no murmurs / rubs / gallops. No extremity edema.  RUE AV fistula with good thrill.  2+ pedal pulses. No carotid bruits.  Abdomen: Obese, no distention.  Bowel sounds positive, soft, no tenderness, no masses palpated. No hepatosplenomegaly. Musculoskeletal: Mild generalized weakness.  No clubbing / cyanosis. Good ROM, no contractures. Normal muscle tone.  Skin: no acute rashes, lesions, ulcers on limited dermatological examination. Neurologic: CN 2-12 grossly intact. Sensation intact, DTR normal. Strength 5/5 in all 4.  Psychiatric: Normal judgment and insight. Alert and oriented x 3. Normal mood.   Labs on Admission: I have personally reviewed following labs and imaging studies  CBC: Recent Labs  Lab 02/19/21 1917  WBC 11.8*  HGB 9.9*  HCT 30.7*  MCV 94.8  PLT 226     Basic Metabolic Panel: Recent Labs  Lab 02/19/21 1917  NA 135  K 4.5  CL 93*  CO2 27  GLUCOSE 269*  BUN 48*  CREATININE 9.62*  CALCIUM 9.3    GFR:  Estimated Creatinine Clearance: 5.7 mL/min (A) (by C-G formula based on SCr of 9.62 mg/dL (H)).  Liver Function Tests: Recent Labs  Lab 02/19/21 1917  AST 17  ALT 21  ALKPHOS 87  BILITOT 0.2*  PROT 6.8  ALBUMIN 3.3*   Radiological Exams on Admission: DG Chest Port 1 View  Result Date: 02/19/2021 CLINICAL DATA:  Patient reports chest tightness, dizziness, nausea. EXAM: PORTABLE CHEST 1 VIEW COMPARISON:  Chest radiograph 09/15/2020 FINDINGS: Stable cardiomediastinal contours with enlarged heart size. Aortic arch calcification. The lungs are clear. No pneumothorax or large pleural effusion. No acute finding in the visualized skeleton. IMPRESSION: Cardiomegaly.  No acute finding. Electronically Signed   By: Audie Pinto M.D.   On: 02/19/2021 19:46   Study Highlights    The left ventricular ejection fraction is normal (55-65%).  Nuclear stress EF: 55%.  There was no ST segment deviation noted during stress.  No T wave inversion was noted during stress.  The study is normal.  This is a low risk study.   1. Normal study without ischemia or infarction.  2. Normal LVEF, 55%.  3. This is a low-risk study.   EKG: Independently reviewed.  Vent. rate 78 BPM PR interval * ms QRS duration 107 ms QT/QTc 416/474 ms P-R-T axes 36 82 14 Sinus rhythm Supraventricular bigeminy Lateral infarct, age indeterminate Anteroseptal infarct, old  Assessment/Plan Principal Problem:   Chest pain  Coronary atherosclerosis Telemetry//observation. Supplemental oxygen as needed. Nitroglycerin ointment to ACW x2 doses. Continue aspirin, atenolol and rosuvastatin. Obtain echocardiogram. Awaiting for cardiology to call us back.  Active Problems:   Diabetic neuropathy (HCC) On gabapentin.    Chronic diastolic CHF  (congestive heart failure) (HCC) No signs of decompensation at this time. Fluid and sodium restriction. Continue atenolol and torsemide.   Anemia in chronic kidney disease Monitor H&H. Erythropoietin per nephrology. Transfuse as needed.    End stage renal disease on dialysis due to type 2 diabetes mellitus ([place]) Continue with dialysis as schedule per nephrology.    Hypertension Hold nifedipine and hydralazine. Continue atenolol 50 mg p.o. twice daily.    Hyperlipidemia Continue rosuvastatin 5 mg p.o. daily.    Type 2 diabetes mellitus with hyperglycemia (HCC) Carbohydrate modified diet. Continue Lantus. She has been using 30 units of insulin glargine SQ at home. Will decrease while hospitalized to 20 units SQ daily. CBG monitoring with RI SS.    Hypoalbuminemia In the setting of ESRD and malnutrition. Consider nutritional services evaluation.   DVT prophylaxis: Lovenox SQ. Code Status:   Full code. Family Communication:   Disposition Plan:   Patient is from:  Home.  Anticipated DC to:  Home.  Anticipated DC date:  02/20/2021.  Anticipated DC barriers: Clinical status.  Consults called: Admission status:  Observation/telemetry.  Severity of Illness:  High severity due to recurrent angina on the patient with history of CAD and other CVD risk factors.  The patient will stay for monitoring and pain control.  Reubin Milan MD [facility]  How to contact the [facility] Attending or Consulting provider [place] or covering provider during after hours [place], for this patient?   1. Check the care team in [facility] and look for a) attending/consulting TR[facility] provider listed and b) the [facility] team listed 2. Log into [email or web address] and use [place] universal password to access. If you do not have the password, please contact the hospital operator. 3. Locate the [facility] provider you are looking for under [facility] and  page to a number that you can be directly reached. 4. If  you still have difficulty reaching the provider, please page the [facility] (Director on Call) for the Hospitalists listed on amion for assistance.  02/19/2021, 11:54 PM   This document was prepared using Dragon voice recognition software and may contain some unintended transcription errors.

## 2021-02-19 NOTE — ED Provider Notes (Signed)
[facility] EMERGENCY DEPARTMENT Provider Note   CSN: [id number] Arrival date & time: 02/19/21  [place]     History Chief Complaint  Patient presents with  . Chest Pain    [name] is a 70 y.o. female.  Patient states that she has been having chest pain and chest tightness off and on for the last couple weeks it seems to be happening more often and occurs when she is getting dialysis.  Patient has a history of coronary artery disease with 1 stent  The history is provided by the patient and medical records. No language interpreter was used.  Chest Pain Pain location:  Substernal area Pain quality: aching   Pain radiates to:  Does not radiate Pain severity:  Moderate Onset quality:  Sudden Timing:  Constant Progression:  Worsening Chronicity:  Recurrent Context: not breathing   Associated symptoms: no abdominal pain, no back pain, no cough, no fatigue and no headache        Past Medical History:  Diagnosis Date  . Acute on chronic diastolic CHF (congestive heart failure) ([place]) 03/05/2015   Grade 2. EF 60-65%  . Anemia    hx of  . Anxiety   . Arthritis   . CAD (coronary artery disease)    s/p Stenting in 2005;  [place] 7/09: Vigorous LV function, 2-3+ MR, RI 40%, RI stent patent, proximal-mid RCA 40-50%;  [place] 7/09:  EF 55-65%, trivial MR;  [place] 11/18/12: EF 66%, normal LV wall motion, mild to moderate anterior ischemia - reviewed by [facility] and felt to rep breast atten (low risk);  Echo 6/14: mild LVH, EF 55-65%, Gr 2 DD, PASP 44, trivial eff   . Chronic kidney disease    CREATININE IS UP--GOING TO KIDNEY MD   . Chronic neck pain   . COPD (chronic obstructive pulmonary disease) ([place])   . Degenerative joint disease    left shoulder  . Diabetic neuropathy ([place])   . Diverticulosis   . Esophageal stricture   . GERD (gastroesophageal reflux disease)   . Hyperlipidemia   . Hypertension   . IDDM (insulin dependent diabetes mellitus)   . Pericardial effusion 03/06/2015    Very small per echo ; pleural nodules seen on CT chest.  . Peripheral vascular disease ([place])   . Stroke [facility])    "mini- years ago"  . Vitamin D deficiency     Patient Active Problem List   Diagnosis Date Noted  . Bruit 01/05/2020  . Educated about COVID-19 virus infection 01/05/2020  . End stage renal disease on dialysis due to type 2 diabetes mellitus ([place]) 07/21/2019  . Lung nodule 05/01/2018  . Prolonged Q-T interval on ECG 09/06/2017  . Coronary artery disease involving native coronary artery of native heart without angina pectoris 09/06/2017  . GAD (generalized anxiety disorder) 03/04/2016  . Leg pain 01/07/2016  . Anemia in chronic kidney disease 12/10/2015  . Dependence on renal dialysis ([place]) 12/10/2015  . End stage renal disease ([place]) 12/10/2015  . Osteopathy in diseases classified elsewhere, unspecified site 12/10/2015  . Constipation 10/25/2015  . Chronic obstructive pulmonary disease ([place]) 03/06/2015  . BMI 30.0-30.9,adult 03/06/2015  . Pericardial effusion 03/06/2015  . Chronic diastolic CHF (congestive heart failure) ([place]) 03/05/2015  . PAD (peripheral artery disease) ([place]) 04/21/2014  . Dyspnea 09/29/2013  . Obesity 12/05/2011  . Diverticulosis 03/29/2011  . Diabetic neuropathy ([place]) 03/29/2011  . Esophageal stricture 03/29/2011  . Vitamin D deficiency 03/29/2011  . Degenerative joint disease of left shoulder  03/29/2011  . Neck pain 03/29/2011  . Type II diabetes mellitus with nephropathy ([place]) 04/19/2009  . Hyperlipidemia associated with type 2 diabetes mellitus ([place]) 04/19/2009  . Hypertension associated with diabetes ([place]) 04/19/2009  . Coronary atherosclerosis 04/19/2009    Past Surgical History:  Procedure Laterality Date  . A/V FISTULAGRAM N/A 08/10/2017   Procedure: A/V Fistulagram - Right Arm;  Surgeon: Elam Dutch, MD;  Location: [place] CV LAB;  Service: Cardiovascular;  Laterality: N/A;  . A/V FISTULAGRAM N/A 03/03/2020   Procedure: A/V  FISTULAGRAM - Right Arm;  Surgeon: Marty Heck, MD;  Location: [place] CV LAB;  Service: Cardiovascular;  Laterality: N/A;  . ABDOMINAL HYSTERECTOMY    . AV FISTULA PLACEMENT Right 07/20/2015   Procedure: RADIAL CEPHALIC ARTERIOVENOUS FISTULA CREATION RIGHT ARM;  Surgeon: Angelia Mould, MD;  Location: [place];  Service: Vascular;  Laterality: Right;  . AV FISTULA PLACEMENT Right 11/26/2015   Procedure:  Creation of RIGHT BRACHIO-CEPHALIC arteriovenous fistula;  Surgeon: Angelia Mould, MD;  Location: [place];  Service: Vascular;  Laterality: Right;  . BACK SURGERY     2007 & 2013  . BLADDER SURGERY     Tack  . CARDIAC CATHETERIZATION  2003, 2005, 2009   1 stent  . cardiac stents    . CATARACT EXTRACTION W/PHACO Right 05/04/2014   Procedure: CATARACT EXTRACTION PHACO AND INTRAOCULAR LENS PLACEMENT (IOC);  Surgeon: Williams Che, MD;  Location: [facility];  Service: Ophthalmology;  Laterality: Right;  CDE 1.47  . CATARACT EXTRACTION W/PHACO Left 07/20/2014   Procedure: CATARACT EXTRACTION WITH PHACO AND INTRAOCULAR LENS PLACEMENT LEFT EYE CDE=1.79;  Surgeon: Williams Che, MD;  Location: [facility];  Service: Ophthalmology;  Laterality: Left;  . CERVICAL SPINE SURGERY  2012   8 screws  . CYSTOSCOPY WITH INJECTION N/A 06/03/2013   Procedure: MACROPLASTIQUE  INJECTION ;  Surgeon: Malka So, MD;  Location: [facility];  Service: Urology;  Laterality: N/A;  . FISTULOGRAM Right 10/20/2016   Procedure: FISTULOGRAM WITH POSSIBLE INTERVENTION;  Surgeon: [name], MD;  Location: [facility];  Service: Vascular;  Laterality: Right;  . INSERTION OF DIALYSIS CATHETER N/A 11/26/2015   Procedure: INSERTION OF DIALYSIS CATHETER;  Surgeon: Angelia Mould, MD;  Location: [place];  Service: Vascular;  Laterality: N/A;  . LAMINECTOMY  12/29/2011  . LUMBAR LAMINECTOMY/DECOMPRESSION MICRODISCECTOMY  12/29/2011   Procedure: LUMBAR LAMINECTOMY/DECOMPRESSION MICRODISCECTOMY;  Surgeon: Floyce Stakes, MD;  Location: [place] [facility];  Service: Neurosurgery;  Laterality: N/A;  Lumbar Two through Lumbar Five Laminectomies/Cellsaver  . PARTIAL HYSTERECTOMY    . PERIPHERAL VASCULAR BALLOON ANGIOPLASTY  03/03/2020   Procedure: PERIPHERAL VASCULAR BALLOON ANGIOPLASTY;  Surgeon: Marty Heck, MD;  Location: [place] CV LAB;  Service: Cardiovascular;;  Rt arm fistula  . PERIPHERAL VASCULAR CATHETERIZATION N/A 11/22/2015   Procedure: Fistulagram;  Surgeon: Angelia Mould, MD;  Location: [place] CV LAB;  Service: Cardiovascular;  Laterality: N/A;  . PERIPHERAL VASCULAR INTERVENTION  08/10/2017   Procedure: PERIPHERAL VASCULAR INTERVENTION;  Surgeon: Elam Dutch, MD;  Location: [place] CV LAB;  Service: Cardiovascular;;  . SHOULDER SURGERY Right    Rotator cuff     OB History    Gravida  2   Para  2   Term  2   Preterm      AB      Living  2     SAB      IAB  Ectopic      Multiple      Live Births              Family History  Problem Relation Age of Onset  . Heart disease Other   . Hypertension Father   . Cancer Father   . Hypertension Mother   . Hypertension Daughter   . Pancreatitis Brother   . Hypertension Daughter   . Diabetes Daughter     Social History   Tobacco Use  . Smoking status: Former Smoker    Packs/day: 1.00    Years: 30.00    Pack years: 30.00    Quit date: 12/04/2001    Years since quitting: 19.2  . Smokeless tobacco: Never Used  Vaping Use  . Vaping Use: Never used  Substance Use Topics  . Alcohol use: No    Alcohol/week: 6.0 standard drinks    Types: 6 Cans of beer per week  . Drug use: No    Home Medications Prior to Admission medications   Medication Sig Start Date End Date Taking? Authorizing Provider  acetaminophen (TYLENOL) 500 MG tablet Take 1,000 mg by mouth every 6 (six) hours as needed for moderate pain or headache.    [provider]  albuterol (PROVENTIL) (2.5 MG/3ML) 0.083%  nebulizer solution Take 3 mLs (2.5 mg total) by nebulization every 6 (six) hours as needed for wheezing or shortness of breath. 08/30/20   Hassell Done Mary-Margaret, FNP  albuterol (VENTOLIN HFA) 108 (90 Base) MCG/ACT inhaler Inhale 2 puffs into the lungs every 6 (six) hours as needed for wheezing or shortness of breath. 12/22/20   Parrett, Fonnie Mu, NP  aspirin 81 MG EC tablet Take 81 mg by mouth daily.    [provider]  atenolol (TENORMIN) 50 MG tablet Take 1 tablet (50 mg total) by mouth 2 (two) times daily. 11/01/20   [name], MD  B Complex-C-Folic Acid (DIALYVITE Q000111Q) 0.8 MG TABS Take 1 tablet by mouth daily.    [provider]  Budeson-Glycopyrrol-Formoterol (BREZTRI AEROSPHERE) 160-9-4.8 MCG/ACT AERO Inhale 2 puffs into the lungs in the morning and at bedtime. 12/13/20   [name], MD  busPIRone (BUSPAR) 7.5 MG tablet Take 1 tablet (7.5 mg total) by mouth 3 (three) times daily as needed. 01/31/21   [name], MD  calcium acetate (PHOSLO) 667 MG capsule Take 667-1,334 mg by mouth See admin instructions. Take 1334 mg with meals 3 times daily and 667 mg with snacks    [provider]  Carboxymethylcellul-Glycerin (LUBRICATING EYE DROPS OP) Place 1 drop into both eyes daily as needed (dry eyes).    [provider]  cetirizine (ZYRTEC) 10 MG tablet Take 1 tablet (10 mg total) by mouth daily as needed for allergies. 11/01/20   [name], MD  gabapentin (NEURONTIN) 400 MG capsule Take two after each dialysis 12/27/20   [name], MD  glucose blood (ONETOUCH ULTRA) test strip Use to test blood sugar 3 times daily as directed. DX: E11.9 09/13/20   [name], MD  hydrALAZINE (APRESOLINE) 50 MG tablet Take 1 tablet (50 mg total) by mouth 2 (two) times daily. 01/31/21 03/02/21  [name], MD  Insulin Glargine [facility]) 100 UNIT/ML Inject 20 Units into the skin at bedtime. Add five units each time the fasting is over 125 three days in a  row. 01/31/21   [name], MD  linaclotide Rolan Lipa) 72 MCG capsule One daily on days you do NOT have dialysis 01/31/21  [name], MD  NIFEdipine (PROCARDIA XL/NIFEDICAL-XL) 90 MG 24 hr tablet Take 90 mg by mouth daily.  04/25/20   [provider]  nitroGLYCERIN (NITROSTAT) 0.4 MG SL tablet Place 1 tablet (0.4 mg total) under the tongue every 5 (five) minutes as needed for chest pain. 01/07/20   Minus Breeding, MD  ondansetron (ZOFRAN) 4 MG tablet TAKE 1 TABLET BY MOUTH EVERY 8 HOURS AS NEEDED FOR NAUSEA AND VOMITING 01/03/21   [name], MD  polyethylene glycol powder (GLYCOLAX/MIRALAX) 17 GM/SCOOP powder Take 17 g by mouth 2 (two) times daily as needed for moderate constipation. For constipation 01/31/21   [name], MD  QUEtiapine (SEROQUEL) 25 MG tablet Take 1 tablet (25 mg total) by mouth at bedtime. For sleep and for anxiety 11/01/20   [name], MD  rosuvastatin (CRESTOR) 5 MG tablet Take 1 tablet (5 mg total) by mouth daily. For cholesterol 02/04/21   [name], MD  Tiotropium Bromide Monohydrate (SPIRIVA RESPIMAT) 1.25 MCG/ACT AERS Inhale 2 puffs into the lungs daily. 12/15/20   Freddi Starr, MD  torsemide (DEMADEX) 20 MG tablet Take 40 mg by mouth daily.     [provider]  triamcinolone cream (KENALOG) 0.1 % Apply 1 application topically 3 (three) times daily. Avoid face and genitalia Patient taking differently: Apply 1 application topically 3 (three) times daily as needed (rash). Avoid face and genitalia 06/09/20   [name], MD    Allergies    Azithromycin, Ace inhibitors, Clopidogrel bisulfate, Codeine, Lisinopril, and Penicillins  Review of Systems   Review of Systems  Constitutional: Negative for appetite change and fatigue.  HENT: Negative for congestion, ear discharge and sinus pressure.   Eyes: Negative for discharge.  Respiratory: Negative for cough.   Cardiovascular: Positive for chest pain.  Gastrointestinal: Negative  for abdominal pain and diarrhea.  Genitourinary: Negative for frequency and hematuria.  Musculoskeletal: Negative for back pain.  Skin: Negative for rash.  Neurological: Negative for seizures and headaches.  Psychiatric/Behavioral: Negative for hallucinations.    Physical Exam Updated Vital Signs BP (!) 154/65   Pulse 76   Temp 98.4 F (36.9 C) (Oral)   Resp 20   Ht '5\' 3"'$  (1.6 m)   Wt 82.6 kg   SpO2 100%   BMI 32.24 kg/m   Physical Exam Vitals and nursing note reviewed.  Constitutional:      Appearance: She is well-developed.  HENT:     Head: Normocephalic.     Nose: Nose normal.  Eyes:     General: No scleral icterus.    Conjunctiva/sclera: Conjunctivae normal.  Neck:     Thyroid: No thyromegaly.  Cardiovascular:     Rate and Rhythm: Normal rate and regular rhythm.     Heart sounds: No murmur heard. No friction rub. No gallop.   Pulmonary:     Breath sounds: No stridor. No wheezing or rales.  Chest:     Chest wall: No tenderness.  Abdominal:     General: There is no distension.     Tenderness: There is no abdominal tenderness. There is no rebound.  Musculoskeletal:        General: Normal range of motion.     Cervical back: Neck supple.  Lymphadenopathy:     Cervical: No cervical adenopathy.  Skin:    Findings: No erythema or rash.  Neurological:     Mental Status: She is alert and oriented to person, place, and time.     Motor: No abnormal muscle  tone.     Coordination: Coordination normal.  Psychiatric:        Behavior: Behavior normal.     ED Results / Procedures / Treatments   Labs (all labs ordered are listed, but only abnormal results are displayed) Labs Reviewed  BASIC METABOLIC PANEL - Abnormal; Notable for the following components:      Result Value   Chloride 93 (*)    Glucose, Bld 269 (*)    BUN 48 (*)    Creatinine, Ser 9.62 (*)    GFR, Estimated 4 (*)    All other components within normal limits  CBC - Abnormal; Notable for the  following components:   WBC 11.8 (*)    RBC 3.24 (*)    Hemoglobin 9.9 (*)    HCT 30.7 (*)    All other components within normal limits  HEPATIC FUNCTION PANEL - Abnormal; Notable for the following components:   Albumin 3.3 (*)    Total Bilirubin 0.2 (*)    All other components within normal limits  TROPONIN I (HIGH SENSITIVITY) - Abnormal; Notable for the following components:   Troponin I (High Sensitivity) 58 (*)    All other components within normal limits  TROPONIN I (HIGH SENSITIVITY) - Abnormal; Notable for the following components:   Troponin I (High Sensitivity) 60 (*)    All other components within normal limits    EKG EKG Interpretation  Date/Time:  Saturday February 19 2021 18:47:12 EDT Ventricular Rate:  78 PR Interval:    QRS Duration: 107 QT Interval:  416 QTC Calculation: 474 R Axis:   82 Text Interpretation: Sinus rhythm Supraventricular bigeminy Lateral infarct, age indeterminate Anteroseptal infarct, old Confirmed by Milton Ferguson [id number]) on 02/19/2021 6:59:39 PM   Radiology DG Chest Port 1 View  Result Date: 02/19/2021 CLINICAL DATA:  Patient reports chest tightness, dizziness, nausea. EXAM: PORTABLE CHEST 1 VIEW COMPARISON:  Chest radiograph 09/15/2020 FINDINGS: Stable cardiomediastinal contours with enlarged heart size. Aortic arch calcification. The lungs are clear. No pneumothorax or large pleural effusion. No acute finding in the visualized skeleton. IMPRESSION: Cardiomegaly.  No acute finding. Electronically Signed   By: Audie Pinto M.D.   On: 02/19/2021 19:46    Procedures Procedures   Medications Ordered in ED Medications - No data to display  ED Course  I have reviewed the triage vital signs and the nursing notes.  Pertinent labs & imaging results that were available during my care of the patient were reviewed by me and considered in my medical decision making (see chart for details).    MDM Rules/Calculators/A&P                          Patient with chest discomfort possible coronary artery disease causing the pain.  She will be admitted to medicine Final Clinical Impression(s) / ED Diagnoses Final diagnoses:  Unstable angina pectoris [facility])    Rx / DC Orders ED Discharge Orders    None       Milton Ferguson, MD 02/21/21 1106

## 2021-02-19 NOTE — ED Triage Notes (Signed)
Patient c/o chest tightness. Per patient started experience the chest tightness on Thursday with dialysis. Patient states blood pressure dropped and heart rate was elevated. Per also had dizziness and nausea. Patient states felt the same the next morning and went to urgent care and was given antinausea medication and antivert. Patient started having the same chest tightness today during dialysis. Patient said blood pressure dropped again and heart rate was elevated. Per patient dizziness only with blood pressure drop but does have nausea and shortness of breath. Patient also states she was taken off dialysis an hour early because of her blood pressure. Patient reports hx of cardiac stent and CHF.  NO significant swelling to lower extremities noted.

## 2021-02-20 ENCOUNTER — Observation Stay (HOSPITAL_BASED_OUTPATIENT_CLINIC_OR_DEPARTMENT_OTHER): Payer: Medicare Other

## 2021-02-20 DIAGNOSIS — Z20822 Contact with and (suspected) exposure to covid-19: Secondary | ICD-10-CM | POA: Diagnosis not present

## 2021-02-20 DIAGNOSIS — I2510 Atherosclerotic heart disease of native coronary artery without angina pectoris: Secondary | ICD-10-CM | POA: Diagnosis not present

## 2021-02-20 DIAGNOSIS — Z992 Dependence on renal dialysis: Secondary | ICD-10-CM

## 2021-02-20 DIAGNOSIS — R079 Chest pain, unspecified: Secondary | ICD-10-CM | POA: Diagnosis not present

## 2021-02-20 DIAGNOSIS — Z87891 Personal history of nicotine dependence: Secondary | ICD-10-CM | POA: Diagnosis not present

## 2021-02-20 DIAGNOSIS — Z79899 Other long term (current) drug therapy: Secondary | ICD-10-CM | POA: Diagnosis not present

## 2021-02-20 DIAGNOSIS — I10 Essential (primary) hypertension: Secondary | ICD-10-CM | POA: Diagnosis not present

## 2021-02-20 DIAGNOSIS — R0789 Other chest pain: Secondary | ICD-10-CM | POA: Diagnosis not present

## 2021-02-20 DIAGNOSIS — Z794 Long term (current) use of insulin: Secondary | ICD-10-CM | POA: Diagnosis not present

## 2021-02-20 DIAGNOSIS — N186 End stage renal disease: Secondary | ICD-10-CM

## 2021-02-20 DIAGNOSIS — J449 Chronic obstructive pulmonary disease, unspecified: Secondary | ICD-10-CM | POA: Diagnosis not present

## 2021-02-20 DIAGNOSIS — E1122 Type 2 diabetes mellitus with diabetic chronic kidney disease: Secondary | ICD-10-CM

## 2021-02-20 DIAGNOSIS — E785 Hyperlipidemia, unspecified: Secondary | ICD-10-CM

## 2021-02-20 DIAGNOSIS — E1165 Type 2 diabetes mellitus with hyperglycemia: Secondary | ICD-10-CM

## 2021-02-20 DIAGNOSIS — E1142 Type 2 diabetes mellitus with diabetic polyneuropathy: Secondary | ICD-10-CM | POA: Diagnosis not present

## 2021-02-20 DIAGNOSIS — I5032 Chronic diastolic (congestive) heart failure: Secondary | ICD-10-CM | POA: Diagnosis not present

## 2021-02-20 DIAGNOSIS — D631 Anemia in chronic kidney disease: Secondary | ICD-10-CM

## 2021-02-20 DIAGNOSIS — I132 Hypertensive heart and chronic kidney disease with heart failure and with stage 5 chronic kidney disease, or end stage renal disease: Secondary | ICD-10-CM | POA: Diagnosis not present

## 2021-02-20 DIAGNOSIS — Z7982 Long term (current) use of aspirin: Secondary | ICD-10-CM | POA: Diagnosis not present

## 2021-02-20 LAB — GLUCOSE, CAPILLARY
Glucose-Capillary: 155 mg/dL — ABNORMAL HIGH (ref 70–99)
Glucose-Capillary: 203 mg/dL — ABNORMAL HIGH (ref 70–99)
Glucose-Capillary: 204 mg/dL — ABNORMAL HIGH (ref 70–99)

## 2021-02-20 LAB — CBC
HCT: 30 % — ABNORMAL LOW (ref 36.0–46.0)
Hemoglobin: 9.3 g/dL — ABNORMAL LOW (ref 12.0–15.0)
MCH: 29.4 pg (ref 26.0–34.0)
MCHC: 31 g/dL (ref 30.0–36.0)
MCV: 94.9 fL (ref 80.0–100.0)
Platelets: 205 10*3/uL (ref 150–400)
RBC: 3.16 MIL/uL — ABNORMAL LOW (ref 3.87–5.11)
RDW: 14.4 % (ref 11.5–15.5)
WBC: 11.3 10*3/uL — ABNORMAL HIGH (ref 4.0–10.5)
nRBC: 0 % (ref 0.0–0.2)

## 2021-02-20 LAB — ECHOCARDIOGRAM COMPLETE
Area-P 1/2: 3.33 cm2
Height: 63 in
S' Lateral: 2.6 cm
Weight: 2912 oz

## 2021-02-20 LAB — SARS CORONAVIRUS 2 (TAT 6-24 HRS): SARS Coronavirus 2: NEGATIVE

## 2021-02-20 LAB — HIV ANTIBODY (ROUTINE TESTING W REFLEX): HIV Screen 4th Generation wRfx: NONREACTIVE

## 2021-02-20 LAB — HEMOGLOBIN A1C
Hgb A1c MFr Bld: 7.9 % — ABNORMAL HIGH (ref 4.8–5.6)
Mean Plasma Glucose: 180.03 mg/dL

## 2021-02-20 MED ORDER — CALCIUM ACETATE (PHOS BINDER) 667 MG PO CAPS: 667.0000 mg | ORAL_CAPSULE | ORAL | Status: DC | PRN

## 2021-02-20 MED ORDER — ATENOLOL 25 MG PO TABS
50.0000 mg | ORAL_TABLET | Freq: Two times a day (BID) | ORAL | Status: DC
  Administered 2021-02-20 – 2021-02-21 (×3): 50 mg via ORAL
  Filled 2021-02-20 (×3): qty 2

## 2021-02-20 MED ORDER — DULOXETINE HCL 20 MG PO CPEP
20.0000 mg | ORAL_CAPSULE | Freq: Every day | ORAL | Status: DC
Start: 2021-02-20 — End: 2021-02-21
  Administered 2021-02-20 – 2021-02-21 (×2): 20 mg via ORAL
  Filled 2021-02-20 (×2): qty 1

## 2021-02-20 MED ORDER — LORATADINE 10 MG PO TABS
10.0000 mg | ORAL_TABLET | Freq: Every day | ORAL | Status: DC
  Administered 2021-02-20 – 2021-02-21 (×2): 10 mg via ORAL
  Filled 2021-02-20 (×2): qty 1

## 2021-02-20 MED ORDER — ALPRAZOLAM 0.5 MG PO TABS
0.5000 mg | ORAL_TABLET | Freq: Once | ORAL | Status: AC
  Administered 2021-02-20: 0.5 mg via ORAL
  Filled 2021-02-20: qty 1

## 2021-02-20 MED ORDER — TIOTROPIUM BROMIDE MONOHYDRATE 1.25 MCG/ACT IN AERS: 2.0000 | INHALATION_SPRAY | Freq: Every day | RESPIRATORY_TRACT | Status: DC

## 2021-02-20 MED ORDER — TORSEMIDE 20 MG PO TABS
40.0000 mg | ORAL_TABLET | Freq: Every day | ORAL | Status: DC
  Administered 2021-02-20: 40 mg via ORAL
  Filled 2021-02-20: qty 2

## 2021-02-20 MED ORDER — BUSPIRONE HCL 5 MG PO TABS: 7.5000 mg | ORAL_TABLET | Freq: Three times a day (TID) | ORAL | Status: DC | PRN

## 2021-02-20 MED ORDER — INSULIN GLARGINE 100 UNIT/ML ~~LOC~~ SOLN
20.0000 [IU] | Freq: Once | SUBCUTANEOUS | Status: AC
  Administered 2021-02-20: 20 [IU] via SUBCUTANEOUS
  Filled 2021-02-20: qty 0.2

## 2021-02-20 MED ORDER — ASPIRIN EC 81 MG PO TBEC
81.0000 mg | DELAYED_RELEASE_TABLET | Freq: Every day | ORAL | Status: DC
  Administered 2021-02-20 – 2021-02-21 (×2): 81 mg via ORAL
  Filled 2021-02-20 (×2): qty 1

## 2021-02-20 MED ORDER — ATENOLOL 25 MG PO TABS
25.0000 mg | ORAL_TABLET | Freq: Once | ORAL | Status: AC
  Administered 2021-02-20: 25 mg via ORAL
  Filled 2021-02-20: qty 1

## 2021-02-20 MED ORDER — CHLORHEXIDINE GLUCONATE CLOTH 2 % EX PADS
6.0000 | MEDICATED_PAD | Freq: Every day | CUTANEOUS | Status: DC
  Administered 2021-02-21: 6 via TOPICAL

## 2021-02-20 MED ORDER — POLYETHYLENE GLYCOL 3350 17 G PO PACK: 17.0000 g | PACK | Freq: Two times a day (BID) | ORAL | Status: DC | PRN

## 2021-02-20 MED ORDER — INSULIN GLARGINE 100 UNIT/ML ~~LOC~~ SOLN
20.0000 [IU] | Freq: Every day | SUBCUTANEOUS | Status: DC
  Administered 2021-02-20: 20 [IU] via SUBCUTANEOUS
  Filled 2021-02-20 (×2): qty 0.2

## 2021-02-20 MED ORDER — CALCIUM ACETATE (PHOS BINDER) 667 MG PO CAPS
1334.0000 mg | ORAL_CAPSULE | Freq: Three times a day (TID) | ORAL | Status: DC
  Administered 2021-02-20 – 2021-02-21 (×3): 1334 mg via ORAL
  Filled 2021-02-20 (×4): qty 2

## 2021-02-20 MED ORDER — FLUTICASONE FUROATE-VILANTEROL 200-25 MCG/INH IN AEPB
1.0000 | INHALATION_SPRAY | Freq: Every day | RESPIRATORY_TRACT | Status: DC
  Administered 2021-02-20 – 2021-02-21 (×2): 1 via RESPIRATORY_TRACT
  Filled 2021-02-20: qty 28

## 2021-02-20 MED ORDER — BUDESON-GLYCOPYRROL-FORMOTEROL 160-9-4.8 MCG/ACT IN AERO: 2.0000 | INHALATION_SPRAY | Freq: Two times a day (BID) | RESPIRATORY_TRACT | Status: DC

## 2021-02-20 MED ORDER — UMECLIDINIUM BROMIDE 62.5 MCG/INH IN AEPB
1.0000 | INHALATION_SPRAY | Freq: Every day | RESPIRATORY_TRACT | Status: DC
  Administered 2021-02-20 – 2021-02-21 (×2): 1 via RESPIRATORY_TRACT
  Filled 2021-02-20: qty 7

## 2021-02-20 MED ORDER — PANTOPRAZOLE SODIUM 40 MG PO TBEC
40.0000 mg | DELAYED_RELEASE_TABLET | Freq: Every day | ORAL | Status: DC
  Administered 2021-02-20 – 2021-02-21 (×2): 40 mg via ORAL
  Filled 2021-02-20 (×2): qty 1

## 2021-02-20 MED ORDER — INSULIN ASPART 100 UNIT/ML ~~LOC~~ SOLN
0.0000 [IU] | Freq: Three times a day (TID) | SUBCUTANEOUS | Status: DC
  Administered 2021-02-20: 2 [IU] via SUBCUTANEOUS
  Administered 2021-02-21 (×2): 1 [IU] via SUBCUTANEOUS

## 2021-02-20 MED ORDER — ROSUVASTATIN CALCIUM 10 MG PO TABS
5.0000 mg | ORAL_TABLET | Freq: Every day | ORAL | Status: DC
  Administered 2021-02-20 – 2021-02-21 (×2): 5 mg via ORAL
  Filled 2021-02-20 (×2): qty 1

## 2021-02-20 NOTE — Progress Notes (Signed)
PROGRESS NOTE    [name]  [id number] DOB: [birth year] DOA: 02/19/2021 PCP: [name], MD  Chief Complaint  Patient presents with  . Chest Pain    Brief admission narrative:  [name] is a 70 y.o. 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  The patient stated that she had occasional angina in the past, which usually was not very symptomatic and resolved with rest.  However, since earlier this week she started having more pronounced pain that was lasting longer associated with dyspnea and palpitations.  She did not feel well during her hemodialysis session on Tuesday due to chest pain with hypotension.  She stopped taking all her blood pressure medications since Wednesday evening in anticipation of her HD session on Thursday, except for the torsemide.  However, despite holding the antihypertensives, she again had chest tightness with some radiation to her back associated with dyspnea, dizziness and nausea.  Then, on today's HD session, her chest tightness was a lot more pronounced with hypotension, tachycardia, diaphoresis, dyspnea, palpitations and nausea.  Her HD session was finished before she completed the third hour.  She denies PND, orthopnea or lower extremity edema.  She denies fever, chills, but feels fatigued.  No rhinorrhea, sore throat, wheezing or hemoptysis.  Denies abdominal pain, emesis, diarrhea, constipation, melena or hematochezia.  No dysuria, frequency or hematuria.  No polyuria, polydipsia, polyphagia or blurred vision.  ED  Course: Initial vital signs were temperature 98.4 F, pulse 83, respirations 20, BP 145/59 mmHg O2 sat 100% on room air.  The patient received 324 mg of chewable aspirin and sublingual nitroglycerin in the emergency department.  Assessment & Plan: 1-Chest pain/chest tightness -Heart score of 5 -Elevation of troponin appreciated-2D echo pending -No acute ischemic changes on telemetry or EKG -Concern for demand ischemia in the setting of mild vascular congestion -Known history of coronary artery disease -Will follow recommendation plan cardiology service -off schedule dialysis on 02/21/2017 arranged by nephrology service. -Continue beta-blocker, aspirin and statins.  2-Diabetic neuropathy ([place]) -Chronically on Neurontin -Patient reports no relief in symptoms. -will add cymbalta  3-Chronic diastolic CHF (congestive heart failure) ([place]) -With concerns for mild vascular congestion after no completing full hemodialysis treatment on 02/19/2021 -Dialysis planned tomorrow 02/21/2021 -Continue to follow daily weights and low-sodium diet.  4-Anemia in chronic kidney disease -IV iron and Epogen therapy as per renal discretion.  5-End stage renal disease on dialysis due to type 2 diabetes mellitus ([place]) -Chronically on hemodialysis Tuesday, Thursday and Saturday -Follow nephrology service recommendations.  6-Hypertension -Fluctuating with significant hypotension preventing completion of dialysis treatment. -Patient expressed not longer having significant urine output; will discontinue torsemide. -continue atenolol  7-Hyperlipidemia -continue statins  8-Type 2 diabetes mellitus with hyperglycemia ([facility]) -Continue Lantus -Started on sliding scale insulin and advised to modify carbohydrate diet.  9-GERD -continue PPI  DVT prophylaxis: Lovenox Code Status: Full code Family Communication: No family at bedside. Disposition:   Status is: Observation  Dispo: The patient is from: Home               Anticipated d/c is to: Home  Patient currently is not medically ready for discharge at this point; continue to express dyspnea on exertion and chest tightness sensation.   Difficult to place patient no    Consultants:   Cardiology service  Nephrology service   Procedures:  See below for x-ray reports   Antimicrobials:  None   Subjective: No fever, no nausea, no vomiting.  Patient continue reporting chest tightness sensation and shortness of breath with exertion.  Objective: Vitals:   02/19/21 2300 02/19/21 2351 02/20/21 0515 02/20/21 0746  BP: (!) 171/66 (!) 156/58 (!) 149/66   Pulse: 74 100 77   Resp: '15 19 18   '$ Temp:  97.9 F (36.6 C) 97.9 F (36.6 C)   TempSrc:   Oral   SpO2: 100% 100% 100% 97%  Weight:      Height:       No intake or output data in the 24 hours ending 02/20/21 1548 Filed Weights   02/19/21 1856  Weight: 82.6 kg    Examination:  General exam: No fever, no nausea, no vomiting.  Patient is still reporting chest tightness sensation and feeling short of breath with activity. Respiratory system: No requiring oxygen supplementation; fine crackles at the bases appreciated.  No using accessory muscle. Cardiovascular system: S1 & S2 heard, no rubs, no gallops, no JVD. Gastrointestinal system: Abdomen is nondistended, soft and nontender. No organomegaly or masses felt. Normal bowel sounds heard. Central nervous system: Alert and oriented. No focal neurological deficits. Extremities: No cyanosis or clubbing. Skin: No petechiae. Psychiatry: Judgement and insight appear normal. Mood & affect appropriate.     Data Reviewed: I have personally reviewed following labs and imaging studies  CBC: Recent Labs  Lab 02/19/21 1917 02/20/21 0413  WBC 11.8* 11.3*  HGB 9.9* 9.3*  HCT 30.7* 30.0*  MCV 94.8 94.9  PLT 226 [id number]    Basic Metabolic Panel: Recent Labs  Lab 02/19/21 1917  NA 135  K 4.5  CL 93*  CO2 27  GLUCOSE 269*   BUN 48*  CREATININE 9.62*  CALCIUM 9.3    GFR: Estimated Creatinine Clearance: 5.7 mL/min (A) (by C-G formula based on SCr of 9.62 mg/dL (H)).  Liver Function Tests: Recent Labs  Lab 02/19/21 1917  AST 17  ALT 21  ALKPHOS 87  BILITOT 0.2*  PROT 6.8  ALBUMIN 3.3*    CBG: Recent Labs  Lab 02/20/21 0004  GLUCAP 43*    Radiology Studies: DG Chest Port 1 View  Result Date: 02/19/2021 CLINICAL DATA:  Patient reports chest tightness, dizziness, nausea. EXAM: PORTABLE CHEST 1 VIEW COMPARISON:  Chest radiograph 09/15/2020 FINDINGS: Stable cardiomediastinal contours with enlarged heart size. Aortic arch calcification. The lungs are clear. No pneumothorax or large pleural effusion. No acute finding in the visualized skeleton. IMPRESSION: Cardiomegaly.  No acute finding. Electronically Signed   By: Audie Pinto M.D.   On: 02/19/2021 19:46   ECHOCARDIOGRAM COMPLETE  Result Date: 02/20/2021    ECHOCARDIOGRAM REPORT   Patient Name:   RAIN FLIEGEL Date of Exam: 02/20/2021 Medical Rec #:  [id number]       Height:       63.0 in Accession #:    [id number]      Weight:       182.0 lb Date of Birth:  12-18-1950       BSA:          1.858 m Patient Age:    83 years        BP:  149/66 mmHg Patient Gender: F               HR:           77 bpm. Exam Location:  [facility] Procedure: 2D Echo, Cardiac Doppler and Color Doppler Indications:    Chest Pain R07.9  History:        Patient has prior history of Echocardiogram examinations, most                 recent 09/13/2015. CHF, CAD, Stroke and COPD; Risk                 Factors:Hypertension, Diabetes and Dyslipidemia. End stage renal                 disease on dialysis due to type 2 diabetes mellitus.  Sonographer:    Alvino Chapel RCS Referring Phys: [id number] [place]  1. Left ventricular ejection fraction, by estimation, is 60 to 65%. The left ventricle has normal function. The left ventricle has no regional wall motion  abnormalities. There is moderate concentric left ventricular hypertrophy. Left ventricular diastolic parameters are consistent with Grade II diastolic dysfunction (pseudonormalization).  2. Right ventricular systolic function is normal. The right ventricular size is normal.  3. Left atrial size was moderately dilated.  4. Right atrial size was mildly dilated.  5. Moderate pericardial effusion. The pericardial effusion is posterior and lateral to the left ventricle. There is no evidence of cardiac tamponade.  6. The mitral valve is normal in structure. Mild mitral valve regurgitation. No evidence of mitral stenosis.  7. The aortic valve is normal in structure. Aortic valve regurgitation is not visualized. Mild to moderate aortic valve sclerosis/calcification is present, without any evidence of aortic stenosis.  8. The inferior vena cava is normal in size with greater than 50% respiratory variability, suggesting right atrial pressure of 3 mmHg. FINDINGS  Left Ventricle: Left ventricular ejection fraction, by estimation, is 60 to 65%. The left ventricle has normal function. The left ventricle has no regional wall motion abnormalities. The left ventricular internal cavity size was normal in size. There is  moderate concentric left ventricular hypertrophy. Left ventricular diastolic parameters are consistent with Grade II diastolic dysfunction (pseudonormalization). Normal left ventricular filling pressure. Right Ventricle: The right ventricular size is normal. No increase in right ventricular wall thickness. Right ventricular systolic function is normal. Left Atrium: Left atrial size was moderately dilated. Right Atrium: Right atrial size was mildly dilated. Pericardium: A moderately sized pericardial effusion is present. The pericardial effusion is posterior and lateral to the left ventricle. There is no evidence of cardiac tamponade. Presence of pericardial fat pad. Mitral Valve: The mitral valve is normal in  structure. There is mild thickening of the mitral valve leaflet(s). There is mild calcification of the mitral valve leaflet(s). Mild mitral valve regurgitation. No evidence of mitral valve stenosis. Tricuspid Valve: The tricuspid valve is normal in structure. Tricuspid valve regurgitation is mild . No evidence of tricuspid stenosis. Aortic Valve: The aortic valve is normal in structure. Aortic valve regurgitation is not visualized. Mild to moderate aortic valve sclerosis/calcification is present, without any evidence of aortic stenosis. Pulmonic Valve: The pulmonic valve was normal in structure. Pulmonic valve regurgitation is not visualized. No evidence of pulmonic stenosis. Aorta: The aortic root is normal in size and structure. Venous: The inferior vena cava is normal in size with greater than 50% respiratory variability, suggesting right atrial pressure of 3 mmHg. IAS/Shunts: No atrial  level shunt detected by color flow Doppler.  LEFT VENTRICLE PLAX 2D LVIDd:         4.70 cm  Diastology LVIDs:         2.60 cm  LV e' medial:    3.81 cm/s LV PW:         1.20 cm  LV E/e' medial:  29.9 LV IVS:        0.90 cm  LV e' lateral:   6.09 cm/s LVOT diam:     1.90 cm  LV E/e' lateral: 18.7 LV SV:         62 LV SV Index:   33 LVOT Area:     2.84 cm  RIGHT VENTRICLE RV S prime:     10.20 cm/s TAPSE (M-mode): 1.8 cm LEFT ATRIUM             Index       RIGHT ATRIUM           Index LA diam:        4.80 cm 2.58 cm/m  RA Area:     19.30 cm LA Vol (A2C):   93.4 ml 50.28 ml/m RA Volume:   55.50 ml  29.88 ml/m LA Vol (A4C):   83.9 ml 45.16 ml/m LA Biplane Vol: 93.3 ml 50.22 ml/m  AORTIC VALVE LVOT Vmax:   92.40 cm/s LVOT Vmean:  65.900 cm/s LVOT VTI:    0.217 m  AORTA Ao Root diam: 3.00 cm MITRAL VALVE MV Area (PHT): 3.33 cm     SHUNTS MV Decel Time: 228 msec     Systemic VTI:  0.22 m MV E velocity: 114.00 cm/s  Systemic Diam: 1.90 cm MV A velocity: 83.40 cm/s MV E/A ratio:  1.37 Ena Dawley MD Electronically signed by  Ena Dawley MD Signature Date/Time: 02/20/2021/12:28:50 PM    Final     Scheduled Meds: . aspirin EC  81 mg Oral Daily  . atenolol  50 mg Oral BID  . calcium acetate  1,334 mg Oral TID WC  . [START ON 02/21/2021] Chlorhexidine Gluconate Cloth  6 each Topical Q0600  . enoxaparin (LOVENOX) injection  30 mg Subcutaneous Q24H  . fluticasone furoate-vilanterol  1 puff Inhalation Daily   And  . umeclidinium bromide  1 puff Inhalation Daily  . insulin aspart  0-9 Units Subcutaneous TID WC  . insulin glargine  20 Units Subcutaneous QHS  . loratadine  10 mg Oral Daily  . QUEtiapine  25 mg Oral QHS  . rosuvastatin  5 mg Oral Daily   Continuous Infusions:   LOS: 0 days    Time spent: 30 minutes.   Barton Dubois, MD [facility]   To contact the attending provider between 7A-7P or the covering provider during after hours 7P-7A, please log into the web site [email or web address] and access using universal  password for that web site. If you do not have the password, please call the hospital operator.  02/20/2021, 3:48 PM

## 2021-02-20 NOTE — Care Management Obs Status (Signed)
[place] NOTIFICATION   Patient Details  Name: [name] MRN: [id number] Date of Birth: Jul 01, 1951   Medicare Observation Status Notification Given:  Yes    Natasha Bence, LCSW 02/20/2021, 2:36 PM

## 2021-02-20 NOTE — Progress Notes (Signed)
*  PRELIMINARY RESULTS* Echocardiogram 2D Echocardiogram has been performed.  [name] 02/20/2021, 9:57 AM

## 2021-02-21 DIAGNOSIS — I5032 Chronic diastolic (congestive) heart failure: Secondary | ICD-10-CM | POA: Diagnosis not present

## 2021-02-21 DIAGNOSIS — E1142 Type 2 diabetes mellitus with diabetic polyneuropathy: Secondary | ICD-10-CM | POA: Diagnosis not present

## 2021-02-21 DIAGNOSIS — I313 Pericardial effusion (noninflammatory): Secondary | ICD-10-CM

## 2021-02-21 DIAGNOSIS — I10 Essential (primary) hypertension: Secondary | ICD-10-CM | POA: Diagnosis not present

## 2021-02-21 DIAGNOSIS — D631 Anemia in chronic kidney disease: Secondary | ICD-10-CM | POA: Diagnosis not present

## 2021-02-21 DIAGNOSIS — I2510 Atherosclerotic heart disease of native coronary artery without angina pectoris: Secondary | ICD-10-CM | POA: Diagnosis not present

## 2021-02-21 DIAGNOSIS — Z992 Dependence on renal dialysis: Secondary | ICD-10-CM | POA: Diagnosis not present

## 2021-02-21 DIAGNOSIS — E1122 Type 2 diabetes mellitus with diabetic chronic kidney disease: Secondary | ICD-10-CM | POA: Diagnosis not present

## 2021-02-21 DIAGNOSIS — R079 Chest pain, unspecified: Secondary | ICD-10-CM | POA: Diagnosis not present

## 2021-02-21 DIAGNOSIS — E1129 Type 2 diabetes mellitus with other diabetic kidney complication: Secondary | ICD-10-CM | POA: Diagnosis not present

## 2021-02-21 DIAGNOSIS — N186 End stage renal disease: Secondary | ICD-10-CM | POA: Diagnosis not present

## 2021-02-21 DIAGNOSIS — E785 Hyperlipidemia, unspecified: Secondary | ICD-10-CM | POA: Diagnosis not present

## 2021-02-21 DIAGNOSIS — I953 Hypotension of hemodialysis: Secondary | ICD-10-CM | POA: Diagnosis not present

## 2021-02-21 DIAGNOSIS — I959 Hypotension, unspecified: Secondary | ICD-10-CM | POA: Diagnosis not present

## 2021-02-21 DIAGNOSIS — N250 Renal osteodystrophy: Secondary | ICD-10-CM | POA: Diagnosis not present

## 2021-02-21 DIAGNOSIS — R0789 Other chest pain: Secondary | ICD-10-CM | POA: Diagnosis not present

## 2021-02-21 LAB — GLUCOSE, CAPILLARY
Glucose-Capillary: 141 mg/dL — ABNORMAL HIGH (ref 70–99)
Glucose-Capillary: 148 mg/dL — ABNORMAL HIGH (ref 70–99)

## 2021-02-21 MED ORDER — ISOSORBIDE MONONITRATE ER 30 MG PO TB24: 15.0000 mg | ORAL_TABLET | Freq: Every day | ORAL | 1 refills | Status: DC

## 2021-02-21 MED ORDER — ISOSORBIDE MONONITRATE ER 30 MG PO TB24
15.0000 mg | ORAL_TABLET | Freq: Every day | ORAL | Status: DC
  Administered 2021-02-21: 15 mg via ORAL
  Filled 2021-02-21: qty 1

## 2021-02-21 MED ORDER — DULOXETINE HCL 20 MG PO CPEP: 20.0000 mg | ORAL_CAPSULE | Freq: Every day | ORAL | 3 refills | Status: DC

## 2021-02-21 MED ORDER — PANTOPRAZOLE SODIUM 40 MG PO TBEC: 40.0000 mg | DELAYED_RELEASE_TABLET | Freq: Every day | ORAL | 1 refills | Status: DC

## 2021-02-21 MED ORDER — GABAPENTIN 400 MG PO CAPS: 400.0000 mg | ORAL_CAPSULE | ORAL | Status: DC

## 2021-02-21 MED ORDER — MELATONIN 3 MG PO TABS
3.0000 mg | ORAL_TABLET | Freq: Every evening | ORAL | Status: DC | PRN
  Administered 2021-02-21: 3 mg via ORAL
  Filled 2021-02-21: qty 1

## 2021-02-21 MED ORDER — GABAPENTIN 400 MG PO CAPS: 400.0000 mg | ORAL_CAPSULE | ORAL | 1 refills | Status: DC

## 2021-02-21 NOTE — Consult Note (Signed)
[name] Admit Date: 02/19/2021 02/21/2021 [name] Requesting Physician:  Dyann Kief MD  Reason for Consult:  ESRD Comanagement HPI:  [id number] ESRD THS [facility] admitted 3/19 after presenting with chest pain.  Past history includes:   Chronic diastolic heart failure  CAD  History of pericardial effusion  Hypertension  History of ischemic CVA  OA  DM2 with TPN  GERD  Hyperlipidemia  Patient states that she has on and off chest pain at baseline but over the past week has had accelerated chest pain during dialysis accompanied by low blood pressure.  She feels that her appetite has improved greatly and that she has been unable to achieve her EDW.  On Saturday, she had symptoms, and left 0.8 kg above her dry weight.  The symptoms are much less pronounced in the intradialytic timeframe.  Patient's troponins have been weakly positive.  TTE performed yesterday with normal LVEF, moderate LVH, grade 2 diastolic dysfunction present, normal RV function, moderate pericardial effusion without evidence of tamponade.  Currently she is without symptoms.  Having no dyspnea.  She has no edema.  Her labs are from 3/19 and consistent with dialysis dependence, without hyperkalemia or acidosis.  Most recent hemoglobin is 9.3.  Outpt HD Orders Unit: [facility] Days: THS Access: RUE AVF without cont bruit, a and v aneurysms, likely venous stenosis present Unable to obtain further information at the current time    ROS Balance of 12 systems is negative w/ exceptions as above  PMH  Past Medical History:  Diagnosis Date  . Acute on chronic diastolic CHF (congestive heart failure) ([place]) 03/05/2015   Grade 2. EF 60-65%  . Anemia    hx of  . Anxiety   . Arthritis   . CAD (coronary artery disease)    s/p Stenting in 2005;  [place] 7/09: Vigorous LV function, 2-3+ MR, RI 40%, RI stent patent, proximal-mid RCA 40-50%;  Echo 7/09:  EF 55-65%, trivial MR;  [place] 11/18/12: EF 66%, normal LV  wall motion, mild to moderate anterior ischemia - reviewed by [facility] and felt to rep breast atten (low risk);  Echo 6/14: mild LVH, EF 55-65%, Gr 2 DD, PASP 44, trivial eff   . Chronic kidney disease    CREATININE IS UP--GOING TO KIDNEY MD   . Chronic neck pain   . COPD (chronic obstructive pulmonary disease) ([place])   . Degenerative joint disease    left shoulder  . Diabetic neuropathy ([place])   . Diverticulosis   . Esophageal stricture   . GERD (gastroesophageal reflux disease)   . Hyperlipidemia   . Hypertension   . IDDM (insulin dependent diabetes mellitus)   . Pericardial effusion 03/06/2015   Very small per echo ; pleural nodules seen on CT chest.  . Peripheral vascular disease ([place])   . Stroke [facility])    "mini- years ago"  . Vitamin D deficiency    PSH  Past Surgical History:  Procedure Laterality Date  . A/V FISTULAGRAM N/A 08/10/2017   Procedure: A/V Fistulagram - Right Arm;  Surgeon: Elam Dutch, MD;  Location: [place] CV LAB;  Service: Cardiovascular;  Laterality: N/A;  . A/V FISTULAGRAM N/A 03/03/2020   Procedure: A/V FISTULAGRAM - Right Arm;  Surgeon: Marty Heck, MD;  Location: [place] CV LAB;  Service: Cardiovascular;  Laterality: N/A;  . ABDOMINAL HYSTERECTOMY    . AV FISTULA PLACEMENT Right 07/20/2015   Procedure: RADIAL CEPHALIC ARTERIOVENOUS FISTULA CREATION RIGHT ARM;  Surgeon: Judeth Cornfield  Scot Dock, MD;  Location: [facility] OR;  Service: Vascular;  Laterality: Right;  . AV FISTULA PLACEMENT Right 11/26/2015   Procedure:  Creation of RIGHT BRACHIO-CEPHALIC arteriovenous fistula;  Surgeon: Angelia Mould, MD;  Location: [place];  Service: Vascular;  Laterality: Right;  . BACK SURGERY     2007 & 2013  . BLADDER SURGERY     Tack  . CARDIAC CATHETERIZATION  2003, 2005, 2009   1 stent  . cardiac stents    . CATARACT EXTRACTION W/PHACO Right 05/04/2014   Procedure: CATARACT EXTRACTION PHACO AND INTRAOCULAR LENS PLACEMENT (IOC);  Surgeon: Williams Che, MD;   Location: [facility];  Service: Ophthalmology;  Laterality: Right;  CDE 1.47  . CATARACT EXTRACTION W/PHACO Left 07/20/2014   Procedure: CATARACT EXTRACTION WITH PHACO AND INTRAOCULAR LENS PLACEMENT LEFT EYE CDE=1.79;  Surgeon: Williams Che, MD;  Location: [facility] ORS;  Service: Ophthalmology;  Laterality: Left;  . CERVICAL SPINE SURGERY  2012   8 screws  . CYSTOSCOPY WITH INJECTION N/A 06/03/2013   Procedure: MACROPLASTIQUE  INJECTION ;  Surgeon: Malka So, MD;  Location: [facility] ORS;  Service: Urology;  Laterality: N/A;  . FISTULOGRAM Right 10/20/2016   Procedure: FISTULOGRAM WITH POSSIBLE INTERVENTION;  Surgeon: [name], MD;  Location: [facility];  Service: Vascular;  Laterality: Right;  . INSERTION OF DIALYSIS CATHETER N/A 11/26/2015   Procedure: INSERTION OF DIALYSIS CATHETER;  Surgeon: Angelia Mould, MD;  Location: [place];  Service: Vascular;  Laterality: N/A;  . LAMINECTOMY  12/29/2011  . LUMBAR LAMINECTOMY/DECOMPRESSION MICRODISCECTOMY  12/29/2011   Procedure: LUMBAR LAMINECTOMY/DECOMPRESSION MICRODISCECTOMY;  Surgeon: Floyce Stakes, MD;  Location: [place] NEURO ORS;  Service: Neurosurgery;  Laterality: N/A;  Lumbar Two through Lumbar Five Laminectomies/Cellsaver  . PARTIAL HYSTERECTOMY    . PERIPHERAL VASCULAR BALLOON ANGIOPLASTY  03/03/2020   Procedure: PERIPHERAL VASCULAR BALLOON ANGIOPLASTY;  Surgeon: Marty Heck, MD;  Location: [place] CV LAB;  Service: Cardiovascular;;  Rt arm fistula  . PERIPHERAL VASCULAR CATHETERIZATION N/A 11/22/2015   Procedure: Fistulagram;  Surgeon: Angelia Mould, MD;  Location: [place] CV LAB;  Service: Cardiovascular;  Laterality: N/A;  . PERIPHERAL VASCULAR INTERVENTION  08/10/2017   Procedure: PERIPHERAL VASCULAR INTERVENTION;  Surgeon: Elam Dutch, MD;  Location: [place] CV LAB;  Service: Cardiovascular;;  . SHOULDER SURGERY Right    Rotator cuff   FH  Family History  Problem Relation Age of Onset  . Heart disease  Other   . Hypertension Father   . Cancer Father   . Hypertension Mother   . Hypertension Daughter   . Pancreatitis Brother   . Hypertension Daughter   . Diabetes Daughter    SH  reports that she quit smoking about 19 years ago. She has a 30.00 pack-year smoking history. She has never used smokeless tobacco. She reports that she does not drink alcohol and does not use drugs. Allergies  Allergies  Allergen Reactions  . Azithromycin     Prolonged QT  . Ace Inhibitors Cough  . Clopidogrel Bisulfate Other (See Comments)    Sick, Headache, "Felt terrible"  . Codeine Other (See Comments)    hallucinations  . Lisinopril Cough  . Penicillins Other (See Comments)    Unknown reaction Did it involve swelling of the face/tongue/throat, SOB, or low BP? Unknown Did it involve sudden or severe rash/hives, skin peeling, or any reaction on the inside of your mouth or nose? Unknown Did you need to seek medical attention at  a hospital or doctor's office? Unknown When did it last happen?childhood allergy If all above answers are "NO", may proceed with cephalosporin use.     Home medications Prior to Admission medications   Medication Sig Start Date End Date Taking? Authorizing Provider  acetaminophen (TYLENOL) 500 MG tablet Take 1,000 mg by mouth every 6 (six) hours as needed for moderate pain or headache.   Yes [provider]  albuterol (PROVENTIL) (2.5 MG/3ML) 0.083% nebulizer solution Take 3 mLs (2.5 mg total) by nebulization every 6 (six) hours as needed for wheezing or shortness of breath. 08/30/20  Yes Hassell Done, Mary-Margaret, FNP  albuterol (VENTOLIN HFA) 108 (90 Base) MCG/ACT inhaler Inhale 2 puffs into the lungs every 6 (six) hours as needed for wheezing or shortness of breath. 12/22/20  Yes Parrett, Tammy S, NP  aspirin 81 MG EC tablet Take 81 mg by mouth daily.   Yes [provider]  atenolol (TENORMIN) 50 MG tablet Take 1 tablet (50 mg total) by mouth 2 (two) times  daily. 11/01/20  Yes [name], [name], MD  B Complex-C-Folic Acid (DIALYVITE 833) 0.8 MG TABS Take 1 tablet by mouth daily.   Yes [provider]  busPIRone (BUSPAR) 7.5 MG tablet Take 1 tablet (7.5 mg total) by mouth 3 (three) times daily as needed. 01/31/21  Yes [name], MD  calcium acetate (PHOSLO) 667 MG capsule Take 667-1,334 mg by mouth See admin instructions. Take 1334 mg with meals 3 times daily and 667 mg with snacks   Yes [provider]  Carboxymethylcellul-Glycerin (LUBRICATING EYE DROPS OP) Place 1 drop into both eyes daily as needed (dry eyes).   Yes [provider]  cetirizine (ZYRTEC) 10 MG tablet Take 1 tablet (10 mg total) by mouth daily as needed for allergies. 11/01/20  Yes [name], MD  gabapentin (NEURONTIN) 400 MG capsule Take two after each dialysis Patient taking differently: Take by mouth See admin instructions. Take two after each dialysis 12/27/20  Yes [name], MD  hydrALAZINE (APRESOLINE) 50 MG tablet Take 1 tablet (50 mg total) by mouth 2 (two) times daily. 01/31/21 03/02/21 Yes [name], [name], MD  Insulin Glargine [facility]) 100 UNIT/ML Inject 20 Units into the skin at bedtime. Add five units each time the fasting is over 125 three days in a row. Patient taking differently: Inject 30 Units into the skin at bedtime. Add five units each time the fasting is over 125 three days in a row. 01/31/21  Yes [name], [name], MD  meclizine (ANTIVERT) 25 MG tablet Take 25 mg by mouth 3 (three) times daily. 02/18/21  Yes [provider]  NIFEdipine (PROCARDIA XL/NIFEDICAL-XL) 90 MG 24 hr tablet Take 90 mg by mouth daily.  04/25/20  Yes [provider]  nitroGLYCERIN (NITROSTAT) 0.4 MG SL tablet Place 1 tablet (0.4 mg total) under the tongue every 5 (five) minutes as needed for chest pain. 01/07/20  Yes Hochrein, Jeneen Rinks, MD  ondansetron (ZOFRAN) 4 MG tablet TAKE 1 TABLET BY MOUTH EVERY 8 HOURS AS NEEDED FOR NAUSEA AND  VOMITING Patient taking differently: Take 4 mg by mouth every 8 (eight) hours as needed. 01/03/21  Yes [name], MD  QUEtiapine (SEROQUEL) 25 MG tablet Take 1 tablet (25 mg total) by mouth at bedtime. For sleep and for anxiety 11/01/20  Yes [name], [name], MD  rosuvastatin (CRESTOR) 5 MG tablet Take 1 tablet (5 mg total) by mouth daily. For cholesterol 02/04/21  Yes [name], MD  Tiotropium Bromide Monohydrate ([place])  1.25 MCG/ACT AERS Inhale 2 puffs into the lungs daily. 12/15/20  Yes Martina Sinner, MD  torsemide (DEMADEX) 20 MG tablet Take 40 mg by mouth daily.    Yes [provider]  Budeson-Glycopyrrol-Formoterol (BREZTRI AEROSPHERE) 160-9-4.8 MCG/ACT AERO Inhale 2 puffs into the lungs in the morning and at bedtime. Patient not taking: No sig reported 12/13/20   Mechele Claude, MD  glucose blood (ONETOUCH ULTRA) test strip Use to test blood sugar 3 times daily as directed. DX: E11.9 09/13/20   Mechele Claude, MD  linaclotide Karlene Einstein) 72 MCG capsule One daily on days you do NOT have dialysis Patient not taking: No sig reported 01/31/21   Mechele Claude, MD  polyethylene glycol powder (GLYCOLAX/MIRALAX) 17 GM/SCOOP powder Take 17 g by mouth 2 (two) times daily as needed for moderate constipation. For constipation Patient not taking: No sig reported 01/31/21   Mechele Claude, MD  triamcinolone cream (KENALOG) 0.1 % Apply 1 application topically 3 (three) times daily. Avoid face and genitalia Patient not taking: Reported on 02/20/2021 06/09/20   Mechele Claude, MD    Current Medications Scheduled Meds: . aspirin EC  81 mg Oral Daily  . atenolol  50 mg Oral BID  . calcium acetate  1,334 mg Oral TID WC  . Chlorhexidine Gluconate Cloth  6 each Topical Q0600  . DULoxetine  20 mg Oral Daily  . enoxaparin (LOVENOX) injection  30 mg Subcutaneous Q24H  . fluticasone furoate-vilanterol  1 puff Inhalation Daily   And  . umeclidinium bromide  1 puff Inhalation Daily  .  insulin aspart  0-9 Units Subcutaneous TID WC  . insulin glargine  20 Units Subcutaneous QHS  . loratadine  10 mg Oral Daily  . pantoprazole  40 mg Oral Daily  . QUEtiapine  25 mg Oral QHS  . rosuvastatin  5 mg Oral Daily   Continuous Infusions: PRN Meds:.acetaminophen, busPIRone, calcium acetate, melatonin, ondansetron (ZOFRAN) IV, polyethylene glycol  CBC Recent Labs  Lab 02/19/21 1917 02/20/21 0413  WBC 11.8* 11.3*  HGB 9.9* 9.3*  HCT 30.7* 30.0*  MCV 94.8 94.9  PLT 226 205   Basic Metabolic Panel Recent Labs  Lab 02/19/21 1917  NA 135  K 4.5  CL 93*  CO2 27  GLUCOSE 269*  BUN 48*  CREATININE 9.62*  CALCIUM 9.3    Physical Exam  Blood pressure (!) 163/70, pulse 83, temperature 98.2 F (36.8 C), resp. rate 19, height 5\' 3"  (1.6 m), weight 87.6 kg, SpO2 97 %. GEN: NAD, lying flat in bed, comfortable appearing ENT: NCAT EYES: EOMI CV: RRR PULM: CTAB ABD: no distension SKIN: no rashes/lesions EXT:No LEE b/l RUE with interupted bruit, weak thrill, aneurysmal changes  Assessment 21F ESRD THS [facility] with increasing chest pain and intradialytic hypontension.  I think that the immediate precipitant of her chest pain is likely intradialytic hypotension from an inaccurately low estimated dry weight.  No obvious hypervolemia on exam and her story is consistent and that her symptoms occur within the last hour of dialysis.  1. ESRD THS [facility] 2. Chest Pain, hx/o CAD, largely occurring during HD and accompanied by intra-dialytic hypotension 3. Diseased RUE AVF 4. Anemia, Hb 9s 5. HTN/Vol: as above, needs EDW up 6. Pericardial effusion, moderate, no tamponade, longstanding, per cardiology 7. DM2 8. CKD-BMD: PhosLo and C3  Plan 1. OK to keep HD on schedule for tomorrow, labs stable 2. I think EDW needs to be increased, as most events are accompanied by IDH,  no obvious hypervolemia on exam 3. Okay for discharge today if cardiology evaluation completed 4. Trend  Hb 5. Trend BMD#s 6. Will need outpt AVF eval 7. Will follow along   [name]   02/21/2021, 9:16 AM

## 2021-02-21 NOTE — Plan of Care (Signed)

## 2021-02-21 NOTE — Discharge Summary (Signed)
Physician Discharge Summary  [name] [id number] DOB: 1951-05-23 DOA: 02/19/2021  PCP: Tamara Fraise, MD  Admit date: 02/19/2021 Discharge date: 02/21/2021  Time spent: 35 minutes  Recommendations for Outpatient Follow-up:  Reassess blood pressure and adjust antihypertensive treatment as needed Follow-up response to the use of Cymbalta controlling patient neuropathy; continues dosage as needed for further management. Patient to follow-up with nephrology service for adjustment on her dry weight  Outpatient follow-up with cardiology service as instructed. Repeat CBC to follow hemoglobin trend.  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  Discharged home with instruction to follow-up with PCP in 10 days.  Patient will resume dialysis service on 02/23/2019 and follow-up with cardiology as an outpatient.  CODE STATUS: Full code.  Diet recommendation: modified carbohydrates and low sodium renal diet.  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 The patient statedthat she had occasional angina in the past, which usually was not very symptomatic and resolvedwith rest. However, since earlier this week she started having more pronounced pain that was lasting longer associated with dyspnea and palpitations. She did not feel well during her hemodialysis session on Tuesday due to chest pain with hypotension. She stopped taking all her blood pressure medicationssince Wednesday evening in anticipation of her HD session on Thursday, except for the torsemide. However, despite holding the antihypertensives, she again had chest tightness with some radiation to her back associated with dyspnea, dizziness and nausea. Then, on today'sHD session, her chest tightness was a lot more pronounced with hypotension, tachycardia, diaphoresis, dyspnea, palpitations and nausea. Her HD session was finished before she completed the third hour. She denies PND, orthopnea or lower extremity edema. She denies fever, chills, but feels fatigued. No rhinorrhea, sore throat, wheezing or hemoptysis. Denies abdominal pain, emesis, diarrhea, constipation, melena or hematochezia. No dysuria, frequency or hematuria. No polyuria, polydipsia, polyphagia or blurred vision.  ED Course:Initial vital signs were temperature98.4 F, pulse 83, respirations 20, BP 145/59 mmHg O2 sat 100% on room air.The patient received 324 mg of chewable aspirin and sublingual nitroglycerin in the emergency department.  Hospital Course:  1-Chest pain/chest tightness -Heart score of 5 -Elevation of troponin appreciated -2D echo without acute wall motion abnormalities and preserved EF.  Grade 2 diastolic dysfunction appreciated. -No acute ischemic changes on telemetry or EKG -Concern for demand ischemia in the setting of mild vascular congestion -Known history of coronary artery disease -Resume hemodialysis with adjusted dry weight on 02/22/21 -Continue beta-blocker,  aspirin and statins. -started on imdur per cardiology rec's.  2-Diabetic neuropathy (HCC) -Chronically on Neurontin -Patient reports no relief in symptoms. -Low-dose mouth has been added to her  regimen.  3-Chronic diastolic CHF (congestive heart failure) ([place]) -With concerns for mild vascular congestion after no completing full hemodialysis treatment on 02/19/2021 -Dialysis planned tomorrow 02/22/2021 -Continue to follow daily weights and low-sodium diet. -Grade 2 dysfunction appreciated as per echocardiogram evaluation.  4-Anemia in chronic kidney disease -IV iron and Epogen therapy as per renal discretion. -No transfusion required. -Stable hemoglobin level.  5-End stage renal disease on dialysis due to type 2 diabetes mellitus ([place]) -Chronically on hemodialysis Tuesday, Thursday and Saturday -Following nephrology service recommendations patient will call for dialysis on her usual schedule 02/22/21; planning to have adjustment to her dry weight to minimize hypotensive events.  6-Hypertension -Fluctuating with significant hypotension preventing completion of dialysis treatment. -Patient expressed not longer having significant urine output; will discontinue torsemide and Procardia. -continue atenolol and Imdur as recommended by cardiology service  7-Hyperlipidemia -continue statins  8-Type 2 diabetes mellitus with hyperglycemia (HCC) -Continue Lantus and home hypoglycemic regimen -Advised to follow modified diet.  9-GERD -continue PPI  Procedures:  See below for x-ray reports.  2D echo:  1. Left ventricular ejection fraction, by estimation, is 60 to 65%. The  left ventricle has normal function. The left ventricle has no regional  wall motion abnormalities. There is moderate concentric left ventricular  hypertrophy. Left ventricular  diastolic parameters are consistent with Grade II diastolic dysfunction  (pseudonormalization).  2. Right ventricular systolic  function is normal. The right ventricular  size is normal.  3. Left atrial size was moderately dilated.  4. Right atrial size was mildly dilated.  5. Moderate pericardial effusion. The pericardial effusion is posterior  and lateral to the left ventricle. There is no evidence of cardiac  tamponade.  6. The mitral valve is normal in structure. Mild mitral valve  regurgitation. No evidence of mitral stenosis.  7. The aortic valve is normal in structure. Aortic valve regurgitation is  not visualized. Mild to moderate aortic valve sclerosis/calcification is  present, without any evidence of aortic stenosis.  8. The inferior vena cava is normal in size with greater than 50%  respiratory variability, suggesting right atrial pressure of 3 mmHg.  Consultations:  Cardiology service  Nephrology  Discharge Exam: Vitals:   02/21/21 0330 02/21/21 0743  BP: (!) 163/70   Pulse: 83   Resp: 19   Temp: 98.2 F (36.8 C)   SpO2: 99% 97%    General exam: No fever, no nausea, no vomiting.  Patient denies chest pain or shortness of breath. Respiratory system: No requiring oxygen supplementation; fine crackles at the bases appreciated.  No using accessory muscle. Cardiovascular system: S1 & S2 heard, no rubs, no gallops, no JVD. Gastrointestinal system: Abdomen is nondistended, soft and nontender. No organomegaly or masses felt. Normal bowel sounds heard. Central nervous system: Alert and oriented. No focal neurological deficits. Extremities: No cyanosis or clubbing. Skin: No petechiae. Psychiatry: Judgement and insight appear normal. Mood & affect appropriate.      Discharge Instructions   Discharge Instructions    (HEART FAILURE PATIENTS) Call MD:  Anytime you have any of the following symptoms: 1) 3 pound weight gain in 24 hours or 5 pounds in 1 week 2) shortness of breath, with or without a dry hacking cough 3) swelling in the hands, feet or stomach 4) if you have to sleep on extra  pillows at night in order to breathe.   Complete by: As directed    Diet - low sodium heart healthy   Complete by: As directed  Discharge instructions   Complete by: As directed    Follow heart healthy and low sodium diet Take medications as prescribed Maintain adequate hydration  Follow-up with cardiology service as instructed Resume outpatient hemodialysis as scheduled     Allergies as of 02/21/2021      Reactions   Azithromycin    Prolonged QT   Ace Inhibitors Cough   Clopidogrel Bisulfate Other (See Comments)   Sick, Headache, "Felt terrible"   Codeine Other (See Comments)   hallucinations   Lisinopril Cough   Penicillins Other (See Comments)   Unknown reaction Did it involve swelling of the face/tongue/throat, SOB, or low BP? Unknown Did it involve sudden or severe rash/hives, skin peeling, or any reaction on the inside of your mouth or nose? Unknown Did you need to seek medical attention at a hospital or doctor's office? Unknown When did it last happen?childhood allergy If all above answers are "NO", may proceed with cephalosporin use.      Medication List    STOP taking these medications   hydrALAZINE 50 MG tablet Commonly known as: APRESOLINE   NIFEdipine 90 MG 24 hr tablet Commonly known as: PROCARDIA XL/NIFEDICAL-XL   torsemide 20 MG tablet Commonly known as: DEMADEX     TAKE these medications   acetaminophen 500 MG tablet Commonly known as: TYLENOL Take 1,000 mg by mouth every 6 (six) hours as needed for moderate pain or headache.   albuterol (2.5 MG/3ML) 0.083% nebulizer solution Commonly known as: PROVENTIL Take 3 mLs (2.5 mg total) by nebulization every 6 (six) hours as needed for wheezing or shortness of breath.   albuterol 108 (90 Base) MCG/ACT inhaler Commonly known as: VENTOLIN HFA Inhale 2 puffs into the lungs every 6 (six) hours as needed for wheezing or shortness of breath.   aspirin 81 MG EC tablet Take 81 mg by mouth daily.    atenolol 50 MG tablet Commonly known as: TENORMIN Take 1 tablet (50 mg total) by mouth 2 (two) times daily.   Basaglar KwikPen 100 UNIT/ML Inject 20 Units into the skin at bedtime. Add five units each time the fasting is over 125 three days in a row. What changed: how much to take   SunGard 160-9-4.8 MCG/ACT Aero Generic drug: Budeson-Glycopyrrol-Formoterol Inhale 2 puffs into the lungs in the morning and at bedtime.   busPIRone 7.5 MG tablet Commonly known as: BUSPAR Take 1 tablet (7.5 mg total) by mouth 3 (three) times daily as needed.   calcium acetate 667 MG capsule Commonly known as: PHOSLO Take 667-1,334 mg by mouth See admin instructions. Take 1334 mg with meals 3 times daily and 667 mg with snacks   cetirizine 10 MG tablet Commonly known as: ZYRTEC Take 1 tablet (10 mg total) by mouth daily as needed for allergies.   Dialyvite 800 0.8 MG Tabs Take 1 tablet by mouth daily.   DULoxetine 20 MG capsule Commonly known as: CYMBALTA Take 1 capsule (20 mg total) by mouth daily. Start taking on: February 22, 2021   gabapentin 400 MG capsule Commonly known as: NEURONTIN Take 1 capsule (400 mg total) by mouth See admin instructions. Take it after HD What changed:   how much to take  how to take this  when to take this  additional instructions   isosorbide mononitrate 30 MG 24 hr tablet Commonly known as: IMDUR Take 0.5 tablets (15 mg total) by mouth daily.   linaclotide 72 MCG capsule Commonly known as: Linzess One daily on days you  do NOT have dialysis   LUBRICATING EYE DROPS OP Place 1 drop into both eyes daily as needed (dry eyes).   meclizine 25 MG tablet Commonly known as: ANTIVERT Take 25 mg by mouth 3 (three) times daily.   nitroGLYCERIN 0.4 MG SL tablet Commonly known as: NITROSTAT Place 1 tablet (0.4 mg total) under the tongue every 5 (five) minutes as needed for chest pain.   ondansetron 4 MG tablet Commonly known as: ZOFRAN TAKE 1  TABLET BY MOUTH EVERY 8 HOURS AS NEEDED FOR NAUSEA AND VOMITING What changed: See the new instructions.   OneTouch Ultra test strip Generic drug: glucose blood Use to test blood sugar 3 times daily as directed. DX: E11.9   pantoprazole 40 MG tablet Commonly known as: PROTONIX Take 1 tablet (40 mg total) by mouth daily. Start taking on: February 22, 2021   polyethylene glycol powder 17 GM/SCOOP powder Commonly known as: GLYCOLAX/MIRALAX Take 17 g by mouth 2 (two) times daily as needed for moderate constipation. For constipation   QUEtiapine 25 MG tablet Commonly known as: SEROQUEL Take 1 tablet (25 mg total) by mouth at bedtime. For sleep and for anxiety   rosuvastatin 5 MG tablet Commonly known as: Crestor Take 1 tablet (5 mg total) by mouth daily. For cholesterol   Spiriva Respimat 1.25 MCG/ACT Aers Generic drug: Tiotropium Bromide Monohydrate Inhale 2 puffs into the lungs daily.   triamcinolone 0.1 % Commonly known as: KENALOG Apply 1 application topically 3 (three) times daily. Avoid face and genitalia      Allergies  Allergen Reactions  . Azithromycin     Prolonged QT  . Ace Inhibitors Cough  . Clopidogrel Bisulfate Other (See Comments)    Sick, Headache, "Felt terrible"  . Codeine Other (See Comments)    hallucinations  . Lisinopril Cough  . Penicillins Other (See Comments)    Unknown reaction Did it involve swelling of the face/tongue/throat, SOB, or low BP? Unknown Did it involve sudden or severe rash/hives, skin peeling, or any reaction on the inside of your mouth or nose? Unknown Did you need to seek medical attention at a hospital or doctor's office? Unknown When did it last happen?childhood allergy If all above answers are "NO", may proceed with cephalosporin use.      Follow-up Information    [name], [name], MD. Schedule an appointment as soon as possible for a visit in 10 day(s).   Specialty: Family Medicine Contact information: [place] [address] [phone]        Minus Breeding, MD .   Specialty: Cardiology Contact information: [place] [address] [phone]                The results of significant diagnostics from this hospitalization (including imaging, microbiology, ancillary and laboratory) are listed below for reference.    Significant Diagnostic Studies: CT Chest High Resolution  Result Date: 02/04/2021 CLINICAL DATA:  Chronic shortness of breath, worse with exertion. EXAM: CT CHEST WITHOUT CONTRAST TECHNIQUE: Multidetector CT imaging of the chest was performed following the standard protocol without intravenous contrast. High resolution imaging of the lungs, as well as inspiratory and expiratory imaging, was performed. COMPARISON:  05/20/2018. FINDINGS: Cardiovascular: Atherosclerotic calcification of the aorta, aortic valve and coronary arteries. Heart is enlarged. No pericardial effusion. Mediastinum/Nodes: Mediastinal lymph nodes appear similar to 05/20/2018. No pathologically enlarged mediastinal or axillary lymph nodes. Hilar regions are difficult to evaluate without IV contrast but appear grossly unremarkable. Esophagus is  grossly unremarkable. Lungs/Pleura: Numerous millimetric basilar predominant pulmonary nodules are unchanged. Negative for subpleural reticulation, traction bronchiectasis/bronchiolectasis, ground-glass, architectural distortion or honeycombing. No pleural fluid. Airway is unremarkable. There is air trapping. Upper Abdomen: Visualized portions of the liver, gallbladder and adrenal glands are unremarkable. Stones are seen in the kidneys. 1.3 cm low-attenuation lesion in the upper pole left kidney is too small to definitively characterize but statistically, a cyst is likely. Visualized portions of the spleen, pancreas, stomach and bowel are unremarkable. Musculoskeletal: Degenerative changes in the spine. No worrisome lytic or sclerotic lesions. IMPRESSION: 1.  No evidence of interstitial lung disease. 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 2. Bilateral renal stones. 3. Aortic atherosclerosis (ICD10-I70.0). Coronary artery calcification. Electronically Signed   By: Lorin Picket M.D.   On: 02/04/2021 09:42   DG Chest Port 1 View  Result Date: 02/19/2021 CLINICAL DATA:  Patient reports chest tightness, dizziness, nausea. EXAM: PORTABLE CHEST 1 VIEW COMPARISON:  Chest radiograph 09/15/2020 FINDINGS: Stable cardiomediastinal contours with enlarged heart size. Aortic arch calcification. The lungs are clear. No pneumothorax or large pleural effusion. No acute finding in the visualized skeleton. IMPRESSION: Cardiomegaly.  No acute finding. Electronically Signed   By: Audie Pinto M.D.   On: 02/19/2021 19:46   ECHOCARDIOGRAM COMPLETE  Result Date: 02/20/2021    ECHOCARDIOGRAM REPORT   Patient Name:   JHORDAN SARK Date of Exam: 02/20/2021 Medical Rec #:  [id number]       Height:       63.0 in Accession #:    [id number]      Weight:       182.0 lb Date of Birth:  March 21, 1951       BSA:          1.858 m Patient Age:    69 years        BP:           149/66 mmHg Patient Gender: F               HR:           77 bpm. Exam Location:  [facility] Procedure: 2D Echo, Cardiac Doppler and Color Doppler Indications:    Chest Pain R07.9  History:        Patient has prior history of Echocardiogram examinations, most                 recent 09/13/2015. CHF, CAD, Stroke and COPD; Risk                 Factors:Hypertension, Diabetes and Dyslipidemia. End stage renal                 disease on dialysis due to type 2 diabetes mellitus.  Sonographer:    Alvino Chapel RCS Referring Phys: [id number] [place]  1. Left ventricular ejection fraction, by estimation, is 60 to 65%. The left ventricle has normal function. The left ventricle has no regional wall motion abnormalities. There is moderate concentric left ventricular hypertrophy. Left ventricular  diastolic parameters are consistent with Grade II diastolic dysfunction (pseudonormalization).  2. Right ventricular systolic function is normal. The right ventricular size is normal.  3. Left atrial size was moderately dilated.  4. Right atrial size was mildly dilated.  5. Moderate pericardial effusion. The pericardial effusion is posterior and lateral to the left ventricle. There is no evidence of cardiac tamponade.  6. The mitral valve is normal in structure. Mild mitral valve regurgitation. No  evidence of mitral stenosis.  7. The aortic valve is normal in structure. Aortic valve regurgitation is not visualized. Mild to moderate aortic valve sclerosis/calcification is present, without any evidence of aortic stenosis.  8. The inferior vena cava is normal in size with greater than 50% respiratory variability, suggesting right atrial pressure of 3 mmHg. FINDINGS  Left Ventricle: Left ventricular ejection fraction, by estimation, is 60 to 65%. The left ventricle has normal function. The left ventricle has no regional wall motion abnormalities. The left ventricular internal cavity size was normal in size. There is  moderate concentric left ventricular hypertrophy. Left ventricular diastolic parameters are consistent with Grade II diastolic dysfunction (pseudonormalization). Normal left ventricular filling pressure. Right Ventricle: The right ventricular size is normal. No increase in right ventricular wall thickness. Right ventricular systolic function is normal. Left Atrium: Left atrial size was moderately dilated. Right Atrium: Right atrial size was mildly dilated. Pericardium: A moderately sized pericardial effusion is present. The pericardial effusion is posterior and lateral to the left ventricle. There is no evidence of cardiac tamponade. Presence of pericardial fat pad. Mitral Valve: The mitral valve is normal in structure. There is mild thickening of the mitral valve leaflet(s). There is mild calcification of  the mitral valve leaflet(s). Mild mitral valve regurgitation. No evidence of mitral valve stenosis. Tricuspid Valve: The tricuspid valve is normal in structure. Tricuspid valve regurgitation is mild . No evidence of tricuspid stenosis. Aortic Valve: The aortic valve is normal in structure. Aortic valve regurgitation is not visualized. Mild to moderate aortic valve sclerosis/calcification is present, without any evidence of aortic stenosis. Pulmonic Valve: The pulmonic valve was normal in structure. Pulmonic valve regurgitation is not visualized. No evidence of pulmonic stenosis. Aorta: The aortic root is normal in size and structure. Venous: The inferior vena cava is normal in size with greater than 50% respiratory variability, suggesting right atrial pressure of 3 mmHg. IAS/Shunts: No atrial level shunt detected by color flow Doppler.  LEFT VENTRICLE PLAX 2D LVIDd:         4.70 cm  Diastology LVIDs:         2.60 cm  LV e' medial:    3.81 cm/s LV PW:         1.20 cm  LV E/e' medial:  29.9 LV IVS:        0.90 cm  LV e' lateral:   6.09 cm/s LVOT diam:     1.90 cm  LV E/e' lateral: 18.7 LV SV:         62 LV SV Index:   33 LVOT Area:     2.84 cm  RIGHT VENTRICLE RV S prime:     10.20 cm/s TAPSE (M-mode): 1.8 cm LEFT ATRIUM             Index       RIGHT ATRIUM           Index LA diam:        4.80 cm 2.58 cm/m  RA Area:     19.30 cm LA Vol (A2C):   93.4 ml 50.28 ml/m RA Volume:   55.50 ml  29.88 ml/m LA Vol (A4C):   83.9 ml 45.16 ml/m LA Biplane Vol: 93.3 ml 50.22 ml/m  AORTIC VALVE LVOT Vmax:   92.40 cm/s LVOT Vmean:  65.900 cm/s LVOT VTI:    0.217 m  AORTA Ao Root diam: 3.00 cm MITRAL VALVE MV Area (PHT): 3.33 cm     SHUNTS MV Decel Time: 228  msec     Systemic VTI:  0.22 m MV E velocity: 114.00 cm/s  Systemic Diam: 1.90 cm MV A velocity: 83.40 cm/s MV E/A ratio:  1.37 Ena Dawley MD Electronically signed by Ena Dawley MD Signature Date/Time: 02/20/2021/12:28:50 PM    Final     Microbiology: Recent  Results (from the past 240 hour(s))  SARS CORONAVIRUS 2 (TAT 6-24 HRS) Nasopharyngeal Nasopharyngeal Swab     Status: None   Collection Time: 02/19/21 10:17 PM   Specimen: Nasopharyngeal Swab  Result Value Ref Range Status   SARS Coronavirus 2 NEGATIVE NEGATIVE Final    Comment: (NOTE) SARS-CoV-2 target nucleic acids are NOT DETECTED.  The SARS-CoV-2 RNA is generally detectable in upper and lower respiratory specimens during the acute phase of infection. Negative results do not preclude SARS-CoV-2 infection, do not rule out co-infections with other pathogens, and should not be used as the sole basis for treatment or other patient management decisions. Negative results must be combined with clinical observations, patient history, and epidemiological information. The expected result is Negative.  Fact Sheet for Patients: SugarRoll.be  Fact Sheet for Healthcare Providers: https://www.woods-mathews.com/  This test is not yet approved or cleared by the [place] FDA and  has been authorized for detection and/or diagnosis of SARS-CoV-2 by FDA under an Emergency Use Authorization (EUA). This EUA will remain  in effect (meaning this test can be used) for the duration of the COVID-19 declaration under Se ction 564(b)(1) of the Act, 21 U.S.C. section 360bbb-3(b)(1), unless the authorization is terminated or revoked sooner.  Performed at [facility], [place] [address] [place], [place] [address]      Labs: Basic Metabolic Panel: Recent Labs  Lab 02/19/21 1917  NA 135  K 4.5  CL 93*  CO2 27  GLUCOSE 269*  BUN 48*  CREATININE 9.62*  CALCIUM 9.3   Liver Function Tests: Recent Labs  Lab 02/19/21 1917  AST 17  ALT 21  ALKPHOS 87  BILITOT 0.2*  PROT 6.8  ALBUMIN 3.3*   No results for input(s): LIPASE, AMYLASE in the last 168 hours. No results for input(s): AMMONIA in the last 168 hours. CBC: Recent Labs  Lab  02/19/21 1917 02/20/21 0413  WBC 11.8* 11.3*  HGB 9.9* 9.3*  HCT 30.7* 30.0*  MCV 94.8 94.9  PLT 226 205   Cardiac Enzymes: No results for input(s): CKTOTAL, CKMB, CKMBINDEX, TROPONINI in the last 168 hours. BNP: BNP (last 3 results) No results for input(s): BNP in the last 8760 hours.  ProBNP (last 3 results) Recent Labs    09/15/20 1546  PROBNP 16,086*    CBG: Recent Labs  Lab 02/20/21 0004 02/20/21 1627 02/20/21 2040 02/21/21 0754  GLUCAP 203* 155* 204* 141*       Signed:  Barton Dubois MD.  [facility] 02/21/2021, 11:28 AM

## 2021-02-21 NOTE — Progress Notes (Signed)
Patient discharged with follow up appointments. IV removed from LFA

## 2021-02-21 NOTE — Consult Note (Addendum)
Cardiology Consultation:   Patient ID: [name] MRN: [id number]; DOB: 1951-09-08  Admit date: 02/19/2021 Date of Consult: 02/21/2021  PCP:  [name], MD   [place]  Cardiologist:  Minus Breeding, MD  Advanced Practice Provider:  No care team member to display Electrophysiologist:  None   6}    Patient Profile:   [name] is a 70 y.o. female with a hx of CAD, prolonged QT, HTN, chronic diastolic CHF who is being seen today for the evaluation of chest pain at the request of Dr. Dyann Kief.  History of Present Illness:   [name] is a 70 yo female with history of CAD S/P stenting RI in 2005, [place] 06/2008 vigorous LV 2-3+MR, RI stent patent, p-mRCA 40-50%, echo 06/2008 trivial MR, myoview 10/2020 EF 55% no ischemia, normal study Also history of chronic diastolic CHF volume managed by dialysis, DM2, HLD, COPD. Echo 08/2020 at [facility] when admitted for COPD exac LVEF 45%.  Patient comes in with progressive angina associated with dyspnea and palpitations. She stopped taking her heart meds the evening before dialysis anticipating BP drops. She was instructed only to hold the morning of dialysis. She says for over a week now she would get squeezing chest pain, dyspnea and palpitations with BP drop the last hour of dialysis.   troponins 58, 60. Not active because of chronic leg pain felt secondary to neuropathy but no relief from gabapentin. She says she's been urinating a lot since admission.  Echo 02/20/21 EF 60-65% mod LVH grade 2 DD, mod pericardial effusion, no tamponade, mild MR. (small pericardial effusion on echo 2016.)  Past Medical History:  Diagnosis Date  . Acute on chronic diastolic CHF (congestive heart failure) ([place]) 03/05/2015   Grade 2. EF 60-65%  . Anemia    hx of  . Anxiety   . Arthritis   . CAD (coronary artery disease)    s/p Stenting in 2005;  [place] 7/09: Vigorous LV function, 2-3+ MR, RI 40%, RI stent patent, proximal-mid RCA 40-50%;   Echo 7/09:  EF 55-65%, trivial MR;  Myoview 11/18/12: EF 66%, normal LV wall motion, mild to moderate anterior ischemia - reviewed by [facility] and felt to rep breast atten (low risk);  Echo 6/14: mild LVH, EF 55-65%, Gr 2 DD, PASP 44, trivial eff   . Chronic kidney disease    CREATININE IS UP--GOING TO KIDNEY MD   . Chronic neck pain   . COPD (chronic obstructive pulmonary disease) ([place])   . Degenerative joint disease    left shoulder  . Diabetic neuropathy ([place])   . Diverticulosis   . Esophageal stricture   . GERD (gastroesophageal reflux disease)   . Hyperlipidemia   . Hypertension   . IDDM (insulin dependent diabetes mellitus)   . Pericardial effusion 03/06/2015   Very small per echo ; pleural nodules seen on CT chest.  . Peripheral vascular disease ([place])   . Stroke [facility])    "mini- years ago"  . Vitamin D deficiency     Past Surgical History:  Procedure Laterality Date  . A/V FISTULAGRAM N/A 08/10/2017   Procedure: A/V Fistulagram - Right Arm;  Surgeon: Elam Dutch, MD;  Location: [place] CV [facility];  Service: Cardiovascular;  Laterality: N/A;  . A/V FISTULAGRAM N/A 03/03/2020   Procedure: A/V FISTULAGRAM - Right Arm;  Surgeon: Marty Heck, MD;  Location: [place] [facility];  Service: Cardiovascular;  Laterality: N/A;  . ABDOMINAL HYSTERECTOMY    .  AV FISTULA PLACEMENT Right 07/20/2015   Procedure: RADIAL CEPHALIC ARTERIOVENOUS FISTULA CREATION RIGHT ARM;  Surgeon: Angelia Mould, MD;  Location: [place];  Service: Vascular;  Laterality: Right;  . AV FISTULA PLACEMENT Right 11/26/2015   Procedure:  Creation of RIGHT BRACHIO-CEPHALIC arteriovenous fistula;  Surgeon: Angelia Mould, MD;  Location: [place];  Service: Vascular;  Laterality: Right;  . BACK SURGERY     2007 & 2013  . BLADDER SURGERY     Tack  . CARDIAC CATHETERIZATION  2003, 2005, 2009   1 stent  . cardiac stents    . CATARACT EXTRACTION W/PHACO Right 05/04/2014   Procedure: CATARACT EXTRACTION PHACO  AND INTRAOCULAR LENS PLACEMENT (IOC);  Surgeon: Williams Che, MD;  Location: [facility];  Service: Ophthalmology;  Laterality: Right;  CDE 1.47  . CATARACT EXTRACTION W/PHACO Left 07/20/2014   Procedure: CATARACT EXTRACTION WITH PHACO AND INTRAOCULAR LENS PLACEMENT LEFT EYE CDE=1.79;  Surgeon: Williams Che, MD;  Location: [facility];  Service: Ophthalmology;  Laterality: Left;  . CERVICAL SPINE SURGERY  2012   8 screws  . CYSTOSCOPY WITH INJECTION N/A 06/03/2013   Procedure: MACROPLASTIQUE  INJECTION ;  Surgeon: Malka So, MD;  Location: [facility];  Service: Urology;  Laterality: N/A;  . FISTULOGRAM Right 10/20/2016   Procedure: FISTULOGRAM WITH POSSIBLE INTERVENTION;  Surgeon: [name], MD;  Location: [facility];  Service: Vascular;  Laterality: Right;  . INSERTION OF DIALYSIS CATHETER N/A 11/26/2015   Procedure: INSERTION OF DIALYSIS CATHETER;  Surgeon: Angelia Mould, MD;  Location: [place];  Service: Vascular;  Laterality: N/A;  . LAMINECTOMY  12/29/2011  . LUMBAR LAMINECTOMY/DECOMPRESSION MICRODISCECTOMY  12/29/2011   Procedure: LUMBAR LAMINECTOMY/DECOMPRESSION MICRODISCECTOMY;  Surgeon: Floyce Stakes, MD;  Location: [place] [facility];  Service: Neurosurgery;  Laterality: N/A;  Lumbar Two through Lumbar Five Laminectomies/Cellsaver  . PARTIAL HYSTERECTOMY    . PERIPHERAL VASCULAR BALLOON ANGIOPLASTY  03/03/2020   Procedure: PERIPHERAL VASCULAR BALLOON ANGIOPLASTY;  Surgeon: Marty Heck, MD;  Location: [place] [facility];  Service: Cardiovascular;;  Rt arm fistula  . PERIPHERAL VASCULAR CATHETERIZATION N/A 11/22/2015   Procedure: Fistulagram;  Surgeon: Angelia Mould, MD;  Location: [place] [facility];  Service: Cardiovascular;  Laterality: N/A;  . PERIPHERAL VASCULAR INTERVENTION  08/10/2017   Procedure: PERIPHERAL VASCULAR INTERVENTION;  Surgeon: Elam Dutch, MD;  Location: [place] [facility];  Service: Cardiovascular;;  . SHOULDER SURGERY Right    Rotator cuff      Home Medications:  Prior to Admission medications   Medication Sig Start Date End Date Taking? Authorizing Provider  acetaminophen (TYLENOL) 500 MG tablet Take 1,000 mg by mouth every 6 (six) hours as needed for moderate pain or headache.   Yes [provider]  albuterol (PROVENTIL) (2.5 MG/3ML) 0.083% nebulizer solution Take 3 mLs (2.5 mg total) by nebulization every 6 (six) hours as needed for wheezing or shortness of breath. 08/30/20  Yes Hassell Done, Mary-Margaret, FNP  albuterol (VENTOLIN HFA) 108 (90 Base) MCG/ACT inhaler Inhale 2 puffs into the lungs every 6 (six) hours as needed for wheezing or shortness of breath. 12/22/20  Yes Parrett, Tammy S, NP  aspirin 81 MG EC tablet Take 81 mg by mouth daily.   Yes [provider]  atenolol (TENORMIN) 50 MG tablet Take 1 tablet (50 mg total) by mouth 2 (two) times daily. 11/01/20  Yes [name], [name], MD  B Complex-C-Folic Acid (DIALYVITE [id number]) 0.8 MG TABS Take 1 tablet by  mouth daily.   Yes [provider]  busPIRone (BUSPAR) 7.5 MG tablet Take 1 tablet (7.5 mg total) by mouth 3 (three) times daily as needed. 01/31/21  Yes [name], MD  calcium acetate (PHOSLO) 667 MG capsule Take 667-1,334 mg by mouth See admin instructions. Take 1334 mg with meals 3 times daily and 667 mg with snacks   Yes [provider]  Carboxymethylcellul-Glycerin (LUBRICATING EYE DROPS OP) Place 1 drop into both eyes daily as needed (dry eyes).   Yes [provider]  cetirizine (ZYRTEC) 10 MG tablet Take 1 tablet (10 mg total) by mouth daily as needed for allergies. 11/01/20  Yes [name], MD  gabapentin (NEURONTIN) 400 MG capsule Take two after each dialysis Patient taking differently: Take by mouth See admin instructions. Take two after each dialysis 12/27/20  Yes [name], MD  hydrALAZINE (APRESOLINE) 50 MG tablet Take 1 tablet (50 mg total) by mouth 2 (two) times daily. 01/31/21 03/02/21 Yes [name], [name], MD  Insulin  Glargine [facility]) 100 UNIT/ML Inject 20 Units into the skin at bedtime. Add five units each time the fasting is over 125 three days in a row. Patient taking differently: Inject 30 Units into the skin at bedtime. Add five units each time the fasting is over 125 three days in a row. 01/31/21  Yes [name], [name], MD  meclizine (ANTIVERT) 25 MG tablet Take 25 mg by mouth 3 (three) times daily. 02/18/21  Yes [provider]  NIFEdipine (PROCARDIA XL/NIFEDICAL-XL) 90 MG 24 hr tablet Take 90 mg by mouth daily.  04/25/20  Yes [provider]  nitroGLYCERIN (NITROSTAT) 0.4 MG SL tablet Place 1 tablet (0.4 mg total) under the tongue every 5 (five) minutes as needed for chest pain. 01/07/20  Yes Hochrein, Jeneen Rinks, MD  ondansetron (ZOFRAN) 4 MG tablet TAKE 1 TABLET BY MOUTH EVERY 8 HOURS AS NEEDED FOR NAUSEA AND VOMITING Patient taking differently: Take 4 mg by mouth every 8 (eight) hours as needed. 01/03/21  Yes [name], MD  QUEtiapine (SEROQUEL) 25 MG tablet Take 1 tablet (25 mg total) by mouth at bedtime. For sleep and for anxiety 11/01/20  Yes [name], [name], MD  rosuvastatin (CRESTOR) 5 MG tablet Take 1 tablet (5 mg total) by mouth daily. For cholesterol 02/04/21  Yes [name], [name], MD  Tiotropium Bromide Monohydrate (SPIRIVA RESPIMAT) 1.25 MCG/ACT AERS Inhale 2 puffs into the lungs daily. 12/15/20  Yes Freddi Starr, MD  torsemide (DEMADEX) 20 MG tablet Take 40 mg by mouth daily.    Yes [provider]  Budeson-Glycopyrrol-Formoterol (BREZTRI AEROSPHERE) 160-9-4.8 MCG/ACT AERO Inhale 2 puffs into the lungs in the morning and at bedtime. Patient not taking: No sig reported 12/13/20   [name], MD  glucose blood (ONETOUCH ULTRA) test strip Use to test blood sugar 3 times daily as directed. DX: E11.9 09/13/20   [name], MD  linaclotide Rolan Lipa) 72 MCG capsule One daily on days you do NOT have dialysis Patient not taking: No sig reported 01/31/21   [name], MD  polyethylene glycol powder (GLYCOLAX/MIRALAX) 17 GM/SCOOP powder Take 17 g by mouth 2 (two) times daily as needed for moderate constipation. For constipation Patient not taking: No sig reported 01/31/21   [name], MD  triamcinolone cream (KENALOG) 0.1 % Apply 1 application topically 3 (three) times daily. Avoid face and genitalia Patient not taking: Reported on 02/20/2021 06/09/20   [name], MD    Inpatient Medications: Scheduled Meds: . aspirin EC  81 mg Oral Daily  . atenolol  50 mg Oral BID  . calcium acetate  1,334 mg Oral TID WC  . Chlorhexidine Gluconate Cloth  6 each Topical Q0600  . DULoxetine  20 mg Oral Daily  . enoxaparin (LOVENOX) injection  30 mg Subcutaneous Q24H  . fluticasone furoate-vilanterol  1 puff Inhalation Daily   And  . umeclidinium bromide  1 puff Inhalation Daily  . insulin aspart  0-9 Units Subcutaneous TID WC  . insulin glargine  20 Units Subcutaneous QHS  . loratadine  10 mg Oral Daily  . pantoprazole  40 mg Oral Daily  . QUEtiapine  25 mg Oral QHS  . rosuvastatin  5 mg Oral Daily   Continuous Infusions:  PRN Meds: acetaminophen, busPIRone, calcium acetate, melatonin, ondansetron (ZOFRAN) IV, polyethylene glycol  Allergies:    Allergies  Allergen Reactions  . Azithromycin     Prolonged QT  . Ace Inhibitors Cough  . Clopidogrel Bisulfate Other (See Comments)    Sick, Headache, "Felt terrible"  . Codeine Other (See Comments)    hallucinations  . Lisinopril Cough  . Penicillins Other (See Comments)    Unknown reaction Did it involve swelling of the face/tongue/throat, SOB, or low BP? Unknown Did it involve sudden or severe rash/hives, skin peeling, or any reaction on the inside of your mouth or nose? Unknown Did you need to seek medical attention at a hospital or doctor's office? Unknown When did it last happen?childhood allergy If all above answers are "NO", may proceed with cephalosporin use.      Social  History:   Social History   Socioeconomic History  . Marital status: Widowed    Spouse name: Not on file  . Number of children: 2  . Years of education: GED  . Highest education level: GED or equivalent  Occupational History  . Occupation: Disabled  Tobacco Use  . Smoking status: Former Smoker    Packs/day: 1.00    Years: 30.00    Pack years: 30.00    Quit date: 12/04/2001    Years since quitting: 19.2  . Smokeless tobacco: Never Used  Vaping Use  . Vaping Use: Never used  Substance and Sexual Activity  . Alcohol use: No    Alcohol/week: 6.0 standard drinks    Types: 6 Cans of beer per week  . Drug use: No  . Sexual activity: Not Currently    Birth control/protection: Surgical  Other Topics Concern  . Not on file  Social History Narrative   Lives in [place]   Social Determinants of Health   Financial Resource Strain: Low Risk   . Difficulty of Paying Living Expenses: Not hard at all  Food Insecurity: Not on file  Transportation Needs: No Transportation Needs  . Lack of Transportation (Medical): No  . Lack of Transportation (Non-Medical): No  Physical Activity: Not on file  Stress: No Stress Concern Present  . Feeling of Stress : Not at all  Social Connections: Not on file  Intimate Partner Violence: Not on file    Family History:     Family History  Problem Relation Age of Onset  . Heart disease Other   . Hypertension Father   . Cancer Father   . Hypertension Mother   . Hypertension Daughter   . Pancreatitis Brother   . Hypertension Daughter   . Diabetes Daughter      ROS:  Please see the history of present illness.  Review of Systems  Constitutional: Positive for  malaise/fatigue.  HENT: Negative.   Eyes: Negative.   Cardiovascular: Positive for chest pain and dyspnea on exertion.  Respiratory: Negative.   Hematologic/Lymphatic: Negative.   Musculoskeletal: Positive for muscle weakness and myalgias. Negative for joint pain.  Gastrointestinal:  Positive for nausea.  Genitourinary: Negative.   Neurological: Positive for numbness and weakness.   All other ROS reviewed and negative.     Physical Exam/Data:   Vitals:   02/20/21 1921 02/20/21 2034 02/21/21 0330 02/21/21 0743  BP:  (!) 189/88 (!) 163/70   Pulse: 73 77 83   Resp: '16 18 19   '$ Temp:  98.2 F (36.8 C) 98.2 F (36.8 C)   TempSrc:      SpO2: 94% 100% 99% 97%  Weight:   87.6 kg   Height:       No intake or output data in the 24 hours ending 02/21/21 0952 Last 3 Weights 02/21/2021 02/19/2021 01/31/2021  Weight (lbs) 193 lb 2 oz 182 lb 192 lb 12.8 oz  Weight (kg) 87.6 kg 82.555 kg 87.454 kg     Body mass index is 34.21 kg/m.  General:  Obese, in no acute distress  HEENT: normal Lymph: no adenopathy Neck: no JVD Endocrine:  No thryomegaly Vascular: No carotid bruits; FA pulses 2+ bilaterally without bruits  Cardiac:  normal S1, S2; 0000000 systolic murmur LSB Lungs:  Few crackles throughout Abd: soft, nontender, no hepatomegaly  Ext: no edema Musculoskeletal:  No deformities, BUE and BLE strength normal and equal Skin: warm and dry  Neuro:  CNs 2-12 intact, no focal abnormalities noted Psych:  Normal affect   EKG:  The EKG was personally reviewed and demonstrates:  NSR with old septal infarct unchanged Telemetry:  Telemetry was personally reviewed and demonstrates:  NSR with PAC's  Relevant CV Studies: Echo 02/20/21 IMPRESSIONS     1. Left ventricular ejection fraction, by estimation, is 60 to 65%. The  left ventricle has normal function. The left ventricle has no regional  wall motion abnormalities. There is moderate concentric left ventricular  hypertrophy. Left ventricular  diastolic parameters are consistent with Grade II diastolic dysfunction  (pseudonormalization).   2. Right ventricular systolic function is normal. The right ventricular  size is normal.   3. Left atrial size was moderately dilated.   4. Right atrial size was mildly dilated.    5. Moderate pericardial effusion. The pericardial effusion is posterior  and lateral to the left ventricle. There is no evidence of cardiac  tamponade.   6. The mitral valve is normal in structure. Mild mitral valve  regurgitation. No evidence of mitral stenosis.   7. The aortic valve is normal in structure. Aortic valve regurgitation is  not visualized. Mild to moderate aortic valve sclerosis/calcification is  present, without any evidence of aortic stenosis.   8. The inferior vena cava is normal in size with greater than 50%  respiratory variability, suggesting right atrial pressure of 3 mmHg.   NST 10/20/20 Study Highlights     The left ventricular ejection fraction is normal (55-65%).  Nuclear stress EF: 55%.  There was no ST segment deviation noted during stress.  No T wave inversion was noted during stress.  The study is normal.  This is a low risk study.   1. Normal study without ischemia or infarction.  2. Normal LVEF, 55%.  3. This is a low-risk study.     Laboratory Data:  High Sensitivity Troponin:   Recent Labs  Lab 02/19/21 1917 02/19/21 2105  TROPONINIHS 58* 60*     Chemistry Recent Labs  Lab 02/19/21 1917  NA 135  K 4.5  CL 93*  CO2 27  GLUCOSE 269*  BUN 48*  CREATININE 9.62*  CALCIUM 9.3  GFRNONAA 4*  ANIONGAP 15    Recent Labs  Lab 02/19/21 1917  PROT 6.8  ALBUMIN 3.3*  AST 17  ALT 21  ALKPHOS 87  BILITOT 0.2*   Hematology Recent Labs  Lab 02/19/21 1917 02/20/21 0413  WBC 11.8* 11.3*  RBC 3.24* 3.16*  HGB 9.9* 9.3*  HCT 30.7* 30.0*  MCV 94.8 94.9  MCH 30.6 29.4  MCHC 32.2 31.0  RDW 14.6 14.4  PLT 226 205   BNPNo results for input(s): BNP, PROBNP in the last 168 hours.  DDimer No results for input(s): DDIMER in the last 168 hours.   Radiology/Studies:  DG Chest Port 1 View  Result Date: 02/19/2021 CLINICAL DATA:  Patient reports chest tightness, dizziness, nausea. EXAM: PORTABLE CHEST 1 VIEW COMPARISON:  Chest  radiograph 09/15/2020 FINDINGS: Stable cardiomediastinal contours with enlarged heart size. Aortic arch calcification. The lungs are clear. No pneumothorax or large pleural effusion. No acute finding in the visualized skeleton. IMPRESSION: Cardiomegaly.  No acute finding. Electronically Signed   By: Audie Pinto M.D.   On: 02/19/2021 19:46   ECHOCARDIOGRAM COMPLETE  Result Date: 02/20/2021    ECHOCARDIOGRAM REPORT   Patient Name:   JOSSELYN BURST Date of Exam: 02/20/2021 Medical Rec #:  [id number]       Height:       63.0 in Accession #:    [id number]      Weight:       182.0 lb Date of Birth:  1950-12-17       BSA:          1.858 m Patient Age:    52 years        BP:           149/66 mmHg Patient Gender: F               HR:           77 bpm. Exam Location:  [facility] Procedure: 2D Echo, Cardiac Doppler and Color Doppler Indications:    Chest Pain R07.9  History:        Patient has prior history of Echocardiogram examinations, most                 recent 09/13/2015. CHF, CAD, Stroke and COPD; Risk                 Factors:Hypertension, Diabetes and Dyslipidemia. End stage renal                 disease on dialysis due to type 2 diabetes mellitus.  Sonographer:    Alvino Chapel RCS Referring Phys: [id number] [place]  1. Left ventricular ejection fraction, by estimation, is 60 to 65%. The left ventricle has normal function. The left ventricle has no regional wall motion abnormalities. There is moderate concentric left ventricular hypertrophy. Left ventricular diastolic parameters are consistent with Grade II diastolic dysfunction (pseudonormalization).  2. Right ventricular systolic function is normal. The right ventricular size is normal.  3. Left atrial size was moderately dilated.  4. Right atrial size was mildly dilated.  5. Moderate pericardial effusion. The pericardial effusion is posterior and lateral to the left ventricle. There is no evidence of cardiac tamponade.  6. The mitral  valve is  normal in structure. Mild mitral valve regurgitation. No evidence of mitral stenosis.  7. The aortic valve is normal in structure. Aortic valve regurgitation is not visualized. Mild to moderate aortic valve sclerosis/calcification is present, without any evidence of aortic stenosis.  8. The inferior vena cava is normal in size with greater than 50% respiratory variability, suggesting right atrial pressure of 3 mmHg. FINDINGS  Left Ventricle: Left ventricular ejection fraction, by estimation, is 60 to 65%. The left ventricle has normal function. The left ventricle has no regional wall motion abnormalities. The left ventricular internal cavity size was normal in size. There is  moderate concentric left ventricular hypertrophy. Left ventricular diastolic parameters are consistent with Grade II diastolic dysfunction (pseudonormalization). Normal left ventricular filling pressure. Right Ventricle: The right ventricular size is normal. No increase in right ventricular wall thickness. Right ventricular systolic function is normal. Left Atrium: Left atrial size was moderately dilated. Right Atrium: Right atrial size was mildly dilated. Pericardium: A moderately sized pericardial effusion is present. The pericardial effusion is posterior and lateral to the left ventricle. There is no evidence of cardiac tamponade. Presence of pericardial fat pad. Mitral Valve: The mitral valve is normal in structure. There is mild thickening of the mitral valve leaflet(s). There is mild calcification of the mitral valve leaflet(s). Mild mitral valve regurgitation. No evidence of mitral valve stenosis. Tricuspid Valve: The tricuspid valve is normal in structure. Tricuspid valve regurgitation is mild . No evidence of tricuspid stenosis. Aortic Valve: The aortic valve is normal in structure. Aortic valve regurgitation is not visualized. Mild to moderate aortic valve sclerosis/calcification is present, without any evidence of aortic  stenosis. Pulmonic Valve: The pulmonic valve was normal in structure. Pulmonic valve regurgitation is not visualized. No evidence of pulmonic stenosis. Aorta: The aortic root is normal in size and structure. Venous: The inferior vena cava is normal in size with greater than 50% respiratory variability, suggesting right atrial pressure of 3 mmHg. IAS/Shunts: No atrial level shunt detected by color flow Doppler.  LEFT VENTRICLE PLAX 2D LVIDd:         4.70 cm  Diastology LVIDs:         2.60 cm  LV e' medial:    3.81 cm/s LV PW:         1.20 cm  LV E/e' medial:  29.9 LV IVS:        0.90 cm  LV e' lateral:   6.09 cm/s LVOT diam:     1.90 cm  LV E/e' lateral: 18.7 LV SV:         62 LV SV Index:   33 LVOT Area:     2.84 cm  RIGHT VENTRICLE RV S prime:     10.20 cm/s TAPSE (M-mode): 1.8 cm LEFT ATRIUM             Index       RIGHT ATRIUM           Index LA diam:        4.80 cm 2.58 cm/m  RA Area:     19.30 cm LA Vol (A2C):   93.4 ml 50.28 ml/m RA Volume:   55.50 ml  29.88 ml/m LA Vol (A4C):   83.9 ml 45.16 ml/m LA Biplane Vol: 93.3 ml 50.22 ml/m  AORTIC VALVE LVOT Vmax:   92.40 cm/s LVOT Vmean:  65.900 cm/s LVOT VTI:    0.217 m  AORTA Ao Root diam: 3.00 cm MITRAL VALVE MV Area (PHT): 3.33 cm  SHUNTS MV Decel Time: 228 msec     Systemic VTI:  0.22 m MV E velocity: 114.00 cm/s  Systemic Diam: 1.90 cm MV A velocity: 83.40 cm/s MV E/A ratio:  1.37 Ena Dawley MD Electronically signed by Ena Dawley MD Signature Date/Time: 02/20/2021/12:28:50 PM    Final      Assessment and Plan:   Chest pain aggravated on dialysis and has been holding meds night before and day of. flat troponins, EKG without acute change, echo normal LVEF and no WMA.  Normal NST 10/2020. Could certainly have progression of CAD but most symptoms are on dialysis.  May need to adjust dry weight per renal. Currently on atenolol, ASA, procardia XL 90, demadex 40, crestor, hydralazine. Consider changing procardia to norvasc and adding Imdur?  Will discuss further with Dr. Harl Bowie  CAD stent RI 2005, patent in 2009, NST normal 10/2020 now with worsening angina on dialysis.  Acute on chronic diastolic CHF BNP [id number] managed with dialysis. No significant CHF on exam and may need higher dry weight.  Moderate pericardial effusion on echo(small 2016)  Prolonged QT-avoid QT prolongation meds  HTN hypotension on dialysis  DM2 A1C 7.9  HLD on crestor  ESRD on HD-having trouble with fistula-renal to manage f/u   Risk Assessment/Risk Scores:     HEAR Score (for undifferentiated chest pain):  HEAR Score: 6  New York Heart Association (NYHA) Functional Class NYHA Class II        For questions or updates, please contact [facility] Please consult [email or web address] for contact info under    Signed, Ermalinda Barrios, PA-C  02/21/2021 9:52 AM  Attending note Patient seen and discussed with PA Bonnell Public, I agree with her documentation. 70 yo female history of chronic diastolic HF, CAD with prior stenting, ESRD, COPD, presented with chest pain occurring primarily last hour of dialysis associated with low bp's.    K 4.5 Cr 0.62 BUN 48 WBC 11.8 Hgb 9.9 Plt 226  Trop 58-->60--> COVID neg CXR no acute process EKG SR, PACs, lateral and anteroseptal Qwaves.  Echo LVE 60-65%, no WMAs, grade II dd, normal RV. Moderate pericardial effusion 10/2020 nuclear stress: no ischemia  Patient with history of CAD and prior stents presents with chest pain and hypotension related to HD sessions. From nephrology notes thought that low bp's are due to dry weight being too low. This could lead to her hypotension and global ischemia in setting of decreased perfusion pressures, perhaps insetting of chronic obstructive disease. At this time do not suspect new obstructive disease. Would follow symptoms with adjustment to dialysis regimen. Can add imdur '15mg'$  daily, in general avoid ranexa given she has had some long QT in the past according to chart. . No  further inpatient cardiac workup is planned.     Pericardial effusion moderate in size and no tamponade, would repeat at f/u. Did have small effusion in 2016  Carlyle Dolly MD

## 2021-02-27 NOTE — Progress Notes (Signed)
Cardiology Office Note  Date: 02/28/2021   ID: Tamara HENEGAR, DOB 1951/06/05, MRN [id number]  PCP:  [name], MD  Cardiologist:  Minus Breeding, MD Electrophysiologist:  None   Chief Complaint: Hospital follow up Chest pain  History of Present Illness: [name] is a 70 y.o. female with a history of chest pain, CAD, Chronic diastolic HF, anemia of chronic disease, ESRD on dialysis, DM2, HTN, HLD, CVA/TIA.   History of previous stenting to ramus intermedius 2005.  Left heart cath July 2009 vigorous LV 2-3+ MR, RI stent patent, P-M RCA 40 to 50%.  Echo 06/2008 trivial MR, Myoview November 2021 EF 55%.  No ischemia, normal study.  Echo at [facility] September 2021 LVEF of 45%.  Presented to [facility] 02/19/2021 for worsening angina beginning earlier in the week.  She stated she had been having chest pain and chest tightness off and on for the prior couple weeks.  She stated it seemed to be happening more often and occurring during dialysis.  Pain had become more pronounced and associated with dyspnea, and palpitations.  She was admitted.   Had troponins of 58-60 . Echo : no WMA's , preserved EF, G2DD. Continuing BB, ASA, Statin. Imdur was started at discharge.  At discharge her hydralazine, nifedipine, and torsemide were stopped.    She is here for hospital follow-up.  She currently denies any anginal or exertional symptoms.  She states her blood pressures been elevated since her antihypertensive medications were stopped.  She she states since her torsemide was stopped she seems to be urinating more.  She states she cannot understand why her antihypertensive medications were stopped.  She states she has been taking nifedipine for a very long time and it seems to work for her.  She believes all of her symptoms are related to her dialysis.  She states she has had dialysis since discharge and has had no problems.  Blood pressure is elevated today at 168/80.  She is convinced dialysis may be pulling  too much fluid and this is causing the symptoms she has been experiencing.    Past Medical History:  Diagnosis Date  . Acute on chronic diastolic CHF (congestive heart failure) ([place]) 03/05/2015   Grade 2. EF 60-65%  . Anemia    hx of  . Anxiety   . Arthritis   . CAD (coronary artery disease)    s/p Stenting in 2005;  [place] 7/09: Vigorous LV function, 2-3+ MR, RI 40%, RI stent patent, proximal-mid RCA 40-50%;  Echo 7/09:  EF 55-65%, trivial MR;  Myoview 11/18/12: EF 66%, normal LV wall motion, mild to moderate anterior ischemia - reviewed by [facility] and felt to rep breast atten (low risk);  Echo 6/14: mild LVH, EF 55-65%, Gr 2 DD, PASP 44, trivial eff   . Chronic kidney disease    CREATININE IS UP--GOING TO KIDNEY MD   . Chronic neck pain   . COPD (chronic obstructive pulmonary disease) ([place])   . Degenerative joint disease    left shoulder  . Diabetic neuropathy ([place])   . Diverticulosis   . Esophageal stricture   . GERD (gastroesophageal reflux disease)   . Hyperlipidemia   . Hypertension   . IDDM (insulin dependent diabetes mellitus)   . Pericardial effusion 03/06/2015   Very small per echo ; pleural nodules seen on CT chest.  . Peripheral vascular disease ([place])   . Stroke [facility])    "mini- years ago"  . Vitamin D deficiency  Past Surgical History:  Procedure Laterality Date  . A/V FISTULAGRAM N/A 08/10/2017   Procedure: A/V Fistulagram - Right Arm;  Surgeon: Elam Dutch, MD;  Location: [place] [facility];  Service: Cardiovascular;  Laterality: N/A;  . A/V FISTULAGRAM N/A 03/03/2020   Procedure: A/V FISTULAGRAM - Right Arm;  Surgeon: Marty Heck, MD;  Location: [place] [facility];  Service: Cardiovascular;  Laterality: N/A;  . ABDOMINAL HYSTERECTOMY    . AV FISTULA PLACEMENT Right 07/20/2015   Procedure: RADIAL CEPHALIC ARTERIOVENOUS FISTULA CREATION RIGHT ARM;  Surgeon: Angelia Mould, MD;  Location: [facility];  Service: Vascular;  Laterality: Right;  . AV FISTULA  PLACEMENT Right 11/26/2015   Procedure:  Creation of RIGHT BRACHIO-CEPHALIC arteriovenous fistula;  Surgeon: Angelia Mould, MD;  Location: [place];  Service: Vascular;  Laterality: Right;  . BACK SURGERY     2007 & 2013  . BLADDER SURGERY     Tack  . CARDIAC CATHETERIZATION  2003, 2005, 2009   1 stent  . cardiac stents    . CATARACT EXTRACTION W/PHACO Right 05/04/2014   Procedure: CATARACT EXTRACTION PHACO AND INTRAOCULAR LENS PLACEMENT (IOC);  Surgeon: Williams Che, MD;  Location: [facility];  Service: Ophthalmology;  Laterality: Right;  CDE 1.47  . CATARACT EXTRACTION W/PHACO Left 07/20/2014   Procedure: CATARACT EXTRACTION WITH PHACO AND INTRAOCULAR LENS PLACEMENT LEFT EYE CDE=1.79;  Surgeon: Williams Che, MD;  Location: [facility];  Service: Ophthalmology;  Laterality: Left;  . CERVICAL SPINE SURGERY  2012   8 screws  . CYSTOSCOPY WITH INJECTION N/A 06/03/2013   Procedure: MACROPLASTIQUE  INJECTION ;  Surgeon: Malka So, MD;  Location: [facility];  Service: Urology;  Laterality: N/A;  . FISTULOGRAM Right 10/20/2016   Procedure: FISTULOGRAM WITH POSSIBLE INTERVENTION;  Surgeon: [name], MD;  Location: [facility];  Service: Vascular;  Laterality: Right;  . INSERTION OF DIALYSIS CATHETER N/A 11/26/2015   Procedure: INSERTION OF DIALYSIS CATHETER;  Surgeon: Angelia Mould, MD;  Location: [place];  Service: Vascular;  Laterality: N/A;  . LAMINECTOMY  12/29/2011  . LUMBAR LAMINECTOMY/DECOMPRESSION MICRODISCECTOMY  12/29/2011   Procedure: LUMBAR LAMINECTOMY/DECOMPRESSION MICRODISCECTOMY;  Surgeon: Floyce Stakes, MD;  Location: [place] [facility];  Service: Neurosurgery;  Laterality: N/A;  Lumbar Two through Lumbar Five Laminectomies/Cellsaver  . PARTIAL HYSTERECTOMY    . PERIPHERAL VASCULAR BALLOON ANGIOPLASTY  03/03/2020   Procedure: PERIPHERAL VASCULAR BALLOON ANGIOPLASTY;  Surgeon: Marty Heck, MD;  Location: [place] [facility];  Service: Cardiovascular;;  Rt arm fistula  .  PERIPHERAL VASCULAR CATHETERIZATION N/A 11/22/2015   Procedure: Fistulagram;  Surgeon: Angelia Mould, MD;  Location: [place] [facility];  Service: Cardiovascular;  Laterality: N/A;  . PERIPHERAL VASCULAR INTERVENTION  08/10/2017   Procedure: PERIPHERAL VASCULAR INTERVENTION;  Surgeon: Elam Dutch, MD;  Location: [place] [facility];  Service: Cardiovascular;;  . SHOULDER SURGERY Right    Rotator cuff    Current Outpatient Medications  Medication Sig Dispense Refill  . acetaminophen (TYLENOL) 500 MG tablet Take 1,000 mg by mouth every 6 (six) hours as needed for moderate pain or headache.    . albuterol (PROVENTIL) (2.5 MG/3ML) 0.083% nebulizer solution Take 3 mLs (2.5 mg total) by nebulization every 6 (six) hours as needed for wheezing or shortness of breath. 75 mL 12  . albuterol (VENTOLIN HFA) 108 (90 Base) MCG/ACT inhaler Inhale 2 puffs into the lungs every 6 (six) hours as needed for wheezing or shortness  of breath. 1 each 2  . aspirin 81 MG EC tablet Take 81 mg by mouth daily.    [name] atenolol (TENORMIN) 50 MG tablet Take 1 tablet (50 mg total) by mouth 2 (two) times daily. 180 tablet 1  . B Complex-C-Folic Acid (DIALYVITE Q000111Q) 0.8 MG TABS Take 1 tablet by mouth daily.    . busPIRone (BUSPAR) 7.5 MG tablet Take 1 tablet (7.5 mg total) by mouth 3 (three) times daily as needed. 90 tablet 1  . calcium acetate (PHOSLO) 667 MG capsule Take 667-1,334 mg by mouth See admin instructions. Take 1334 mg with meals 3 times daily and 667 mg with snacks    . Carboxymethylcellul-Glycerin (LUBRICATING EYE DROPS OP) Place 1 drop into both eyes daily as needed (dry eyes).    . cetirizine (ZYRTEC) 10 MG tablet Take 1 tablet (10 mg total) by mouth daily as needed for allergies. 90 tablet 3  . DULoxetine (CYMBALTA) 20 MG capsule Take 1 capsule (20 mg total) by mouth daily. 30 capsule 3  . gabapentin (NEURONTIN) 400 MG capsule Take 1 capsule (400 mg total) by mouth See admin instructions. Take it after  HD 30 capsule 1  . glucose blood (ONETOUCH ULTRA) test strip Use to test blood sugar 3 times daily as directed. DX: E11.9 300 each 12  . Insulin Glargine (BASAGLAR KWIKPEN) 100 UNIT/ML Inject 20 Units into the skin at bedtime. Add five units each time the fasting is over 125 three days in a row. (Patient taking differently: Inject 30 Units into the skin at bedtime. Add five units each time the fasting is over 125 three days in a row.) 30 mL 1  . isosorbide mononitrate (IMDUR) 30 MG 24 hr tablet Take 0.5 tablets (15 mg total) by mouth daily. 30 tablet 1  . meclizine (ANTIVERT) 25 MG tablet Take 25 mg by mouth 3 (three) times daily.    . nitroGLYCERIN (NITROSTAT) 0.4 MG SL tablet Place 1 tablet (0.4 mg total) under the tongue every 5 (five) minutes as needed for chest pain. 25 tablet 11  . ondansetron (ZOFRAN) 4 MG tablet TAKE 1 TABLET BY MOUTH EVERY 8 HOURS AS NEEDED FOR NAUSEA AND VOMITING (Patient taking differently: Take 4 mg by mouth every 8 (eight) hours as needed.) 20 tablet 0  . pantoprazole (PROTONIX) 40 MG tablet Take 1 tablet (40 mg total) by mouth daily. 30 tablet 1  . polyethylene glycol powder (GLYCOLAX/MIRALAX) 17 GM/SCOOP powder Take 17 g by mouth 2 (two) times daily as needed for moderate constipation. For constipation 3350 g 5  . QUEtiapine (SEROQUEL) 25 MG tablet Take 1 tablet (25 mg total) by mouth at bedtime. For sleep and for anxiety 90 tablet 1  . rosuvastatin (CRESTOR) 5 MG tablet Take 1 tablet (5 mg total) by mouth daily. For cholesterol 90 tablet 3  . Tiotropium Bromide Monohydrate (SPIRIVA RESPIMAT) 1.25 MCG/ACT AERS Inhale 2 puffs into the lungs daily. 8 g 0  . Budeson-Glycopyrrol-Formoterol (BREZTRI AEROSPHERE) 160-9-4.8 MCG/ACT AERO Inhale 2 puffs into the lungs in the morning and at bedtime. (Patient not taking: No sig reported) 32.1 g 11   No current facility-administered medications for this visit.   Allergies:  Azithromycin, Ace inhibitors, Clopidogrel bisulfate,  Codeine, Lisinopril, and Penicillins   Social History: The patient  reports that she quit smoking about 19 years ago. She has a 30.00 pack-year smoking history. She has never used smokeless tobacco. She reports that she does not drink alcohol and does not use drugs.  Family History: [name] family history includes Cancer in her father; Diabetes in her daughter; Heart disease in an other family member; Hypertension in her daughter, daughter, father, and mother; Pancreatitis in her brother.   ROS:  Please see the history of present illness. Otherwise, complete review of systems is positive for none.  All other systems are reviewed and negative.   Physical Exam: VS:  BP (!) 168/80   Pulse 84   Ht '5\' 3"'$  (1.6 m)   Wt 195 lb 3.2 oz (88.5 kg)   SpO2 98%   BMI 34.58 kg/m , BMI Body mass index is 34.58 kg/m.  Wt Readings from Last 3 Encounters:  02/28/21 195 lb 3.2 oz (88.5 kg)  02/21/21 193 lb 2 oz (87.6 kg)  01/31/21 192 lb 12.8 oz (87.5 kg)    General: Patient appears comfortable at rest. Neck: Supple, no elevated JVP or carotid bruits, no thyromegaly. Lungs: Clear to auscultation, nonlabored breathing at rest. Cardiac: Regular rate and rhythm, no S3 or significant systolic murmur, no pericardial rub. Extremities: No pitting edema, distal pulses 2+. Skin: Warm and dry. Musculoskeletal: No kyphosis. Neuropsychiatric: Alert and oriented x3, affect grossly appropriate.  ECG:  EKG 02/19/2021 sinus rhythm with a rate of 78.  Supraventricular bigeminy.  Lateral infarct, age indeterminate, anteroseptal infarct, old.  Recent Labwork: 09/15/2020: NT-Pro BNP 16,086 02/19/2021: ALT 21; AST 17; BUN 48; Creatinine, Ser 9.62; Potassium 4.5; Sodium 135 02/20/2021: Hemoglobin 9.3; Platelets 205     Component Value Date/Time   CHOL 203 (H) 01/31/2021 1001   CHOL 241 (H) 03/25/2013 1026   TRIG 240 (H) 01/31/2021 1001   TRIG 147 10/09/2013 1527   TRIG 209 (H) 03/25/2013 1026   HDL 36 (L)  01/31/2021 1001   HDL 65 10/09/2013 1527   HDL 51 03/25/2013 1026   CHOLHDL 5.6 (H) 01/31/2021 1001   CHOLHDL 3.0 06/17/2008 0625   VLDL 19 06/17/2008 0625   LDLCALC 124 (H) 01/31/2021 1001   LDLCALC 125 (H) 10/09/2013 1527   LDLCALC 148 (H) 03/25/2013 1026    Other Studies Reviewed Today:  Echocardiogram 02/20/2021  1. Left ventricular ejection fraction, by estimation, is 60 to 65%. The left ventricle has normal function. The left ventricle has no regional wall motion abnormalities. There is moderate concentric left ventricular hypertrophy. Left ventricular diastolic parameters are consistent with Grade II diastolic dysfunction (pseudonormalization). 2. Right ventricular systolic function is normal. The right ventricular size is normal. 3. Left atrial size was moderately dilated. 4. Right atrial size was mildly dilated. 5. Moderate pericardial effusion. The pericardial effusion is posterior and lateral to the left ventricle. There is no evidence of cardiac tamponade. 6. The mitral valve is normal in structure. Mild mitral valve regurgitation. No evidence of mitral stenosis. 7. The aortic valve is normal in structure. Aortic valve regurgitation is not visualized. Mild to moderate aortic valve sclerosis/calcification is present, without any evidence of aortic stenosis. 8. The inferior vena cava is normal in size with greater than 50% respiratory variability, suggesting right atrial pressure of 3 mmHg.     Nuclear stress test 10/20/2020  The left ventricular ejection fraction is normal (55-65%).  Nuclear stress EF: 55%.  There was no ST segment deviation noted during stress.  No T wave inversion was noted during stress.  The study is normal.  This is a low risk study.   1. Normal study without ischemia or infarction.  2. Normal LVEF, 55%.  3. This is a low-risk study.  Assessment and Plan:  1. Chest pain, unspecified type   2. CAD in native artery   3. Chronic  diastolic heart failure ([place])   4. Essential hypertension   5. ESRD (end stage renal disease) on dialysis ([place])    1. Chest pain, unspecified type Recent chest pain during dialysis.  She is admitted for evaluation [facility] on 02/20/2020 high-sensitivity troponin initially 58, second troponin 60.  Her chest pain resolved after dialysis was stopped.  Recent Lexiscan stress October 20, 2020 showed no evidence of ischemia.  Continue aspirin 81 mg daily.  Continue Imdur 15 mg daily.  2. CAD in native artery Previous history of stenting.  History of ramus intermedius stent placed in 2005.  Continue Imdur 15 mg daily, aspirin 81 mg daily.  Continue sublingual nitroglycerin as needed.  Continue Crestor 5 mg daily.  3. Chronic diastolic heart failure ([facility]) Recent echocardiogram with EF of 60 to 65%.  No WMA's.  Moderate concentric LVH.  G2 DD, LA moderately dilated, RA mildly dilated.  Moderate pericardial effusion.  No evidence of tamponade.  Mild MR.  Torsemide was discontinued at recent hospital discharge.  Patient states since stopping the torsemide she seems to be urinating more.  4. Essential hypertension Blood pressure elevated today on arrival to 168/80.  Patient states during recent hospitalization her torsemide, hydralazine, and nifedipine were stopped.  Since then her blood pressure has been elevated.  She states the nifedipine has worked for her for a long time in the past.  Continue atenolol 50 mg p.o. twice daily.  We will restart nifedipine at 60 mg on nondialysis days only.  Advised patient to monitor her blood pressures and if sustained greater than 130/80 may need to titrate medications or may need to switch nifedipine to amlodipine.  5. ESRD (end stage renal disease) on dialysis [facility]) Patient was having some issues with chest pain during dialysis recently.  She believes they may have been pulling too much fluid from her.  She states since discharge from hospital she has had no  issues during dialysis sessions.  Medication Adjustments/Labs and Tests Ordered: Current medicines are reviewed at length with the patient today.  Concerns regarding medicines are outlined above.   Disposition: Follow-up with Dr. Percival Spanish in 2 months per patient's request.  Signed, Levell July, NP 02/28/2021 3:32 PM    [facility] [place], [place], [place] [address] Phone: [phone]; Fax: [phone]

## 2021-02-27 NOTE — Progress Notes (Incomplete)
Cardiology Office Note  Date: 02/27/2021   ID: HALEA DANIELSEN, DOB 1951/08/06, MRN [id number]  PCP:  [name], MD  Cardiologist:  Minus Breeding, MD Electrophysiologist:  None   Chief Complaint: Hospital follow up Chest pain  History of Present Illness: [name] is a 70 y.o. female with a history of chest pain, CAD, Chronic diastolic HF, anemia of chronic disease, ESRD on dialysis, DM2, HTN, HLD, CVA/TIA.   Present to [facility] for worsening angina beginning earlier in the week. Pain had become more pronounced and associated with dyspnea and palpitations. During dialysis she felt chest pains with hypotension, diaphoresis, tachycardia, dyspnea, palpitations, and nausea.Dialysis was stopped.  Had elevated troponin's. Echo : no WMA's , preserved EF, G2DD. Continuing BB, ASA, Statin. Imdur started.    Past Medical History:  Diagnosis Date  . Acute on chronic diastolic CHF (congestive heart failure) ([place]) 03/05/2015   Grade 2. EF 60-65%  . Anemia    hx of  . Anxiety   . Arthritis   . CAD (coronary artery disease)    s/p Stenting in 2005;  [place] 7/09: Vigorous LV function, 2-3+ MR, RI 40%, RI stent patent, proximal-mid RCA 40-50%;  Echo 7/09:  EF 55-65%, trivial MR;  [place] 11/18/12: EF 66%, normal LV wall motion, mild to moderate anterior ischemia - reviewed by [facility] and felt to rep breast atten (low risk);  Echo 6/14: mild LVH, EF 55-65%, Gr 2 DD, PASP 44, trivial eff   . Chronic kidney disease    CREATININE IS UP--GOING TO KIDNEY MD   . Chronic neck pain   . COPD (chronic obstructive pulmonary disease) ([place])   . Degenerative joint disease    left shoulder  . Diabetic neuropathy ([place])   . Diverticulosis   . Esophageal stricture   . GERD (gastroesophageal reflux disease)   . Hyperlipidemia   . Hypertension   . IDDM (insulin dependent diabetes mellitus)   . Pericardial effusion 03/06/2015   Very small per echo ; pleural nodules seen on CT chest.  . Peripheral vascular disease  ([place])   . Stroke [facility])    "mini- years ago"  . Vitamin D deficiency     Past Surgical History:  Procedure Laterality Date  . A/V FISTULAGRAM N/A 08/10/2017   Procedure: A/V Fistulagram - Right Arm;  Surgeon: Elam Dutch, MD;  Location: [place] [facility];  Service: Cardiovascular;  Laterality: N/A;  . A/V FISTULAGRAM N/A 03/03/2020   Procedure: A/V FISTULAGRAM - Right Arm;  Surgeon: Marty Heck, MD;  Location: [place] [facility];  Service: Cardiovascular;  Laterality: N/A;  . ABDOMINAL HYSTERECTOMY    . AV FISTULA PLACEMENT Right 07/20/2015   Procedure: RADIAL CEPHALIC ARTERIOVENOUS FISTULA CREATION RIGHT ARM;  Surgeon: Angelia Mould, MD;  Location: [place];  Service: Vascular;  Laterality: Right;  . AV FISTULA PLACEMENT Right 11/26/2015   Procedure:  Creation of RIGHT BRACHIO-CEPHALIC arteriovenous fistula;  Surgeon: Angelia Mould, MD;  Location: [place];  Service: Vascular;  Laterality: Right;  . BACK SURGERY     2007 & 2013  . BLADDER SURGERY     Tack  . CARDIAC CATHETERIZATION  2003, 2005, 2009   1 stent  . cardiac stents    . CATARACT EXTRACTION W/PHACO Right 05/04/2014   Procedure: CATARACT EXTRACTION PHACO AND INTRAOCULAR LENS PLACEMENT (IOC);  Surgeon: Williams Che, MD;  [facility];  Service: Ophthalmology;  Laterality: Right;  CDE 1.47  . CATARACT EXTRACTION  W/PHACO Left 07/20/2014   Procedure: CATARACT EXTRACTION WITH PHACO AND INTRAOCULAR LENS PLACEMENT LEFT EYE CDE=1.79;  Surgeon: Williams Che, MD;  [facility];  Service: Ophthalmology;  Laterality: Left;  . CERVICAL SPINE SURGERY  2012   8 screws  . CYSTOSCOPY WITH INJECTION N/A 06/03/2013   Procedure: MACROPLASTIQUE  INJECTION ;  Surgeon: Malka So, MD;  [facility]: [facility];  Service: Urology;  Laterality: N/A;  . FISTULOGRAM Right 10/20/2016   Procedure: FISTULOGRAM WITH POSSIBLE INTERVENTION;  Surgeon: [name], MD;  [facility];  Service: Vascular;  Laterality: Right;   . INSERTION OF DIALYSIS CATHETER N/A 11/26/2015   Procedure: INSERTION OF DIALYSIS CATHETER;  Surgeon: Angelia Mould, MD;  Location: [place];  Service: Vascular;  Laterality: N/A;  . LAMINECTOMY  12/29/2011  . LUMBAR LAMINECTOMY/DECOMPRESSION MICRODISCECTOMY  12/29/2011   Procedure: LUMBAR LAMINECTOMY/DECOMPRESSION MICRODISCECTOMY;  Surgeon: Floyce Stakes, MD;  Location: [place] [facility];  Service: Neurosurgery;  Laterality: N/A;  Lumbar Two through Lumbar Five Laminectomies/Cellsaver  . PARTIAL HYSTERECTOMY    . PERIPHERAL VASCULAR BALLOON ANGIOPLASTY  03/03/2020   Procedure: PERIPHERAL VASCULAR BALLOON ANGIOPLASTY;  Surgeon: Marty Heck, MD;  Location: [place] [facility];  Service: Cardiovascular;;  Rt arm fistula  . PERIPHERAL VASCULAR CATHETERIZATION N/A 11/22/2015   Procedure: Fistulagram;  Surgeon: Angelia Mould, MD;  Location: [place] [facility];  Service: Cardiovascular;  Laterality: N/A;  . PERIPHERAL VASCULAR INTERVENTION  08/10/2017   Procedure: PERIPHERAL VASCULAR INTERVENTION;  Surgeon: Elam Dutch, MD;  Location: [place] [facility];  Service: Cardiovascular;;  . SHOULDER SURGERY Right    Rotator cuff    Current Outpatient Medications  Medication Sig Dispense Refill  . acetaminophen (TYLENOL) 500 MG tablet Take 1,000 mg by mouth every 6 (six) hours as needed for moderate pain or headache.    . albuterol (PROVENTIL) (2.5 MG/3ML) 0.083% nebulizer solution Take 3 mLs (2.5 mg total) by nebulization every 6 (six) hours as needed for wheezing or shortness of breath. 75 mL 12  . albuterol (VENTOLIN HFA) 108 (90 Base) MCG/ACT inhaler Inhale 2 puffs into the lungs every 6 (six) hours as needed for wheezing or shortness of breath. 1 each 2  . aspirin 81 MG EC tablet Take 81 mg by mouth daily.    [name] atenolol (TENORMIN) 50 MG tablet Take 1 tablet (50 mg total) by mouth 2 (two) times daily. 180 tablet 1  . B Complex-C-Folic Acid (DIALYVITE Q000111Q) 0.8 MG TABS Take 1 tablet  by mouth daily.    . Budeson-Glycopyrrol-Formoterol (BREZTRI AEROSPHERE) 160-9-4.8 MCG/ACT AERO Inhale 2 puffs into the lungs in the morning and at bedtime. (Patient not taking: No sig reported) 32.1 g 11  . busPIRone (BUSPAR) 7.5 MG tablet Take 1 tablet (7.5 mg total) by mouth 3 (three) times daily as needed. 90 tablet 1  . calcium acetate (PHOSLO) 667 MG capsule Take 667-1,334 mg by mouth See admin instructions. Take 1334 mg with meals 3 times daily and 667 mg with snacks    . Carboxymethylcellul-Glycerin (LUBRICATING EYE DROPS OP) Place 1 drop into both eyes daily as needed (dry eyes).    . cetirizine (ZYRTEC) 10 MG tablet Take 1 tablet (10 mg total) by mouth daily as needed for allergies. 90 tablet 3  . DULoxetine (CYMBALTA) 20 MG capsule Take 1 capsule (20 mg total) by mouth daily. 30 capsule 3  . gabapentin (NEURONTIN) 400 MG capsule Take 1 capsule (400 mg total) by mouth See  admin instructions. Take it after HD 30 capsule 1  . glucose blood (ONETOUCH ULTRA) test strip Use to test blood sugar 3 times daily as directed. DX: E11.9 300 each 12  . Insulin Glargine (BASAGLAR KWIKPEN) 100 UNIT/ML Inject 20 Units into the skin at bedtime. Add five units each time the fasting is over 125 three days in a row. (Patient taking differently: Inject 30 Units into the skin at bedtime. Add five units each time the fasting is over 125 three days in a row.) 30 mL 1  . isosorbide mononitrate (IMDUR) 30 MG 24 hr tablet Take 0.5 tablets (15 mg total) by mouth daily. 30 tablet 1  . linaclotide (LINZESS) 72 MCG capsule One daily on days you do NOT have dialysis (Patient not taking: No sig reported) 30 capsule 2  . meclizine (ANTIVERT) 25 MG tablet Take 25 mg by mouth 3 (three) times daily.    . nitroGLYCERIN (NITROSTAT) 0.4 MG SL tablet Place 1 tablet (0.4 mg total) under the tongue every 5 (five) minutes as needed for chest pain. 25 tablet 11  . ondansetron (ZOFRAN) 4 MG tablet TAKE 1 TABLET BY MOUTH EVERY 8 HOURS AS  NEEDED FOR NAUSEA AND VOMITING (Patient taking differently: Take 4 mg by mouth every 8 (eight) hours as needed.) 20 tablet 0  . pantoprazole (PROTONIX) 40 MG tablet Take 1 tablet (40 mg total) by mouth daily. 30 tablet 1  . polyethylene glycol powder (GLYCOLAX/MIRALAX) 17 GM/SCOOP powder Take 17 g by mouth 2 (two) times daily as needed for moderate constipation. For constipation (Patient not taking: No sig reported) 3350 g 5  . QUEtiapine (SEROQUEL) 25 MG tablet Take 1 tablet (25 mg total) by mouth at bedtime. For sleep and for anxiety 90 tablet 1  . rosuvastatin (CRESTOR) 5 MG tablet Take 1 tablet (5 mg total) by mouth daily. For cholesterol 90 tablet 3  . Tiotropium Bromide Monohydrate (SPIRIVA RESPIMAT) 1.25 MCG/ACT AERS Inhale 2 puffs into the lungs daily. 8 g 0  . triamcinolone cream (KENALOG) 0.1 % Apply 1 application topically 3 (three) times daily. Avoid face and genitalia (Patient not taking: Reported on 02/20/2021) 45 g 0   No current facility-administered medications for this visit.   Allergies:  Azithromycin, Ace inhibitors, Clopidogrel bisulfate, Codeine, Lisinopril, and Penicillins   Social History: The patient  reports that she quit smoking about 19 years ago. She has a 30.00 pack-year smoking history. She has never used smokeless tobacco. She reports that she does not drink alcohol and does not use drugs.   Family History: [name] family history includes Cancer in her father; Diabetes in her daughter; Heart disease in an other family member; Hypertension in her daughter, daughter, father, and mother; Pancreatitis in her brother.   ROS:  Please see the history of present illness. Otherwise, complete review of systems is positive for {NONE DEFAULTED:18576::"none"}.  All other systems are reviewed and negative.   Physical Exam: VS:  There were no vitals taken for this visit., BMI There is no height or weight on file to calculate BMI.  Wt Readings from Last 3 Encounters:   02/21/21 193 lb 2 oz (87.6 kg)  01/31/21 192 lb 12.8 oz (87.5 kg)  12/27/20 187 lb 12.8 oz (85.2 kg)    General: Patient appears comfortable at rest. HEENT: Conjunctiva and lids normal, oropharynx clear with moist mucosa. Neck: Supple, no elevated JVP or carotid bruits, no thyromegaly. Lungs: Clear to auscultation, nonlabored breathing at rest. Cardiac: Regular rate and  rhythm, no S3 or significant systolic murmur, no pericardial rub. Abdomen: Soft, nontender, no hepatomegaly, bowel sounds present, no guarding or rebound. Extremities: No pitting edema, distal pulses 2+. Skin: Warm and dry. Musculoskeletal: No kyphosis. Neuropsychiatric: Alert and oriented x3, affect grossly appropriate.  ECG:  {EKG/Telemetry Strips Reviewed:[id number]}  Recent Labwork: 09/15/2020: NT-Pro BNP 16,086 02/19/2021: ALT 21; AST 17; BUN 48; Creatinine, Ser 9.62; Potassium 4.5; Sodium 135 02/20/2021: Hemoglobin 9.3; Platelets 205     Component Value Date/Time   CHOL 203 (H) 01/31/2021 1001   CHOL 241 (H) 03/25/2013 1026   TRIG 240 (H) 01/31/2021 1001   TRIG 147 10/09/2013 1527   TRIG 209 (H) 03/25/2013 1026   HDL 36 (L) 01/31/2021 1001   HDL 65 10/09/2013 1527   HDL 51 03/25/2013 1026   CHOLHDL 5.6 (H) 01/31/2021 1001   CHOLHDL 3.0 06/17/2008 0625   VLDL 19 06/17/2008 0625   LDLCALC 124 (H) 01/31/2021 1001   LDLCALC 125 (H) 10/09/2013 1527   LDLCALC 148 (H) 03/25/2013 1026    Other Studies Reviewed Today:  Echocardiogram 02/20/2021  1. Left ventricular ejection fraction, by estimation, is 60 to 65%. The left ventricle has normal function. The left ventricle has no regional wall motion abnormalities. There is moderate concentric left ventricular hypertrophy. Left ventricular diastolic parameters are consistent with Grade II diastolic dysfunction (pseudonormalization). 2. Right ventricular systolic function is normal. The right ventricular size is normal. 3. Left atrial size was moderately  dilated. 4. Right atrial size was mildly dilated. 5. Moderate pericardial effusion. The pericardial effusion is posterior and lateral to the left ventricle. There is no evidence of cardiac tamponade. 6. The mitral valve is normal in structure. Mild mitral valve regurgitation. No evidence of mitral stenosis. 7. The aortic valve is normal in structure. Aortic valve regurgitation is not visualized. Mild to moderate aortic valve sclerosis/calcification is present, without any evidence of aortic stenosis. 8. The inferior vena cava is normal in size with greater than 50% respiratory variability, suggesting right atrial pressure of 3 mmHg.  Assessment and Plan:  No diagnosis found.   Medication Adjustments/Labs and Tests Ordered: Current medicines are reviewed at length with the patient today.  Concerns regarding medicines are outlined above.   Disposition: Follow-up with ***  Signed, Levell July, NP 02/27/2021 11:37 PM    [facility] at [facility] [place], [place], [place] [address] Phone: [phone]; Fax: [phone]

## 2021-02-28 ENCOUNTER — Ambulatory Visit (INDEPENDENT_AMBULATORY_CARE_PROVIDER_SITE_OTHER): Payer: Medicare Other | Admitting: Family Medicine

## 2021-02-28 ENCOUNTER — Other Ambulatory Visit: Payer: Self-pay

## 2021-02-28 ENCOUNTER — Encounter: Payer: Self-pay | Admitting: Family Medicine

## 2021-02-28 VITALS — BP 168/80 | HR 84 | Ht 63.0 in | Wt 195.2 lb

## 2021-02-28 DIAGNOSIS — R079 Chest pain, unspecified: Secondary | ICD-10-CM | POA: Diagnosis not present

## 2021-02-28 DIAGNOSIS — I2510 Atherosclerotic heart disease of native coronary artery without angina pectoris: Secondary | ICD-10-CM

## 2021-02-28 DIAGNOSIS — N186 End stage renal disease: Secondary | ICD-10-CM

## 2021-02-28 DIAGNOSIS — I5032 Chronic diastolic (congestive) heart failure: Secondary | ICD-10-CM

## 2021-02-28 DIAGNOSIS — Z992 Dependence on renal dialysis: Secondary | ICD-10-CM | POA: Diagnosis not present

## 2021-02-28 DIAGNOSIS — I10 Essential (primary) hypertension: Secondary | ICD-10-CM

## 2021-02-28 MED ORDER — NIFEDIPINE ER OSMOTIC RELEASE 60 MG PO TB24: 60.0000 mg | ORAL_TABLET | Freq: Every day | ORAL | 6 refills | Status: DC

## 2021-02-28 NOTE — Patient Instructions (Signed)
Medication Instructions:   Restart the Procardia '60mg'$  on non dialysis days.  Continue all other medications.    Labwork: none  Testing/Procedures: none  Follow-Up: May with Dr. Percival Spanish   Any Other Special Instructions Will Be Listed Below (If Applicable).  If you need a refill on your cardiac medications before your next appointment, please call your pharmacy.

## 2021-03-01 DIAGNOSIS — Z992 Dependence on renal dialysis: Secondary | ICD-10-CM | POA: Diagnosis not present

## 2021-03-02 ENCOUNTER — Encounter: Payer: Self-pay | Admitting: Family Medicine

## 2021-03-02 ENCOUNTER — Other Ambulatory Visit: Payer: Self-pay

## 2021-03-02 ENCOUNTER — Ambulatory Visit (INDEPENDENT_AMBULATORY_CARE_PROVIDER_SITE_OTHER): Payer: Medicare Other | Admitting: Family Medicine

## 2021-03-02 VITALS — BP 110/55 | HR 66 | Temp 97.2°F | Ht 63.0 in | Wt 192.0 lb

## 2021-03-02 DIAGNOSIS — I200 Unstable angina: Secondary | ICD-10-CM

## 2021-03-02 DIAGNOSIS — N186 End stage renal disease: Secondary | ICD-10-CM | POA: Diagnosis not present

## 2021-03-02 DIAGNOSIS — E1142 Type 2 diabetes mellitus with diabetic polyneuropathy: Secondary | ICD-10-CM

## 2021-03-02 DIAGNOSIS — F411 Generalized anxiety disorder: Secondary | ICD-10-CM | POA: Diagnosis not present

## 2021-03-02 DIAGNOSIS — Z992 Dependence on renal dialysis: Secondary | ICD-10-CM

## 2021-03-02 DIAGNOSIS — Z794 Long term (current) use of insulin: Secondary | ICD-10-CM | POA: Diagnosis not present

## 2021-03-02 DIAGNOSIS — E1165 Type 2 diabetes mellitus with hyperglycemia: Secondary | ICD-10-CM

## 2021-03-02 DIAGNOSIS — E1122 Type 2 diabetes mellitus with diabetic chronic kidney disease: Secondary | ICD-10-CM | POA: Diagnosis not present

## 2021-03-02 DIAGNOSIS — I2510 Atherosclerotic heart disease of native coronary artery without angina pectoris: Secondary | ICD-10-CM | POA: Diagnosis not present

## 2021-03-02 MED ORDER — QUETIAPINE FUMARATE 50 MG PO TABS: 50.0000 mg | ORAL_TABLET | Freq: Every day | ORAL | 2 refills | Status: DC

## 2021-03-02 NOTE — Progress Notes (Signed)
Subjective:  Patient ID: [name], female    DOB: [birth year]  Age: 70 y.o. MRN: [id number]  CC: Follow-up (Medication change)   HPI [name] presents for recheck of her anxiety.  She was in the hospital due to his chest pain about 10 days ago.  Her medicines were changed.  She was cut back on the gabapentin to 400 mg after dialysis.  Her Procardia was stopped along with hydralazine.  She saw her cardiologist yesterday who put her back on the nifedipine.  However her blood pressure is excellent currently so that the hydralazine is not running.  Her dry weight was adjusted up a kilogram to 85.5 kg from 84.5.  At 84.5 toward the end she was having a lot of chest tightness and weakness.  Undigested to 85.5 she did not have the symptoms.  She is still having a great deal of anxiety.  She is not sure how she is actually taking the buspirone.  We reviewed the need to take it 3 times a day.  She feels that the Seroquel works well for her.  She has to have that.  It does make a big difference.  Her insulin dosage is unchanged.  Her leg still hurts discomfort in the NCV's of the legs next week.  I believe this will confirm her diabetic neuropathy.  However, the reduction of gabapentin to 400 mg has not increased her pain.  She says that she could not tolerate 600 mg in the past.  Depression screen [facility] 2/9 03/02/2021 12/27/2020 11/01/2020  Decreased Interest 0 0 0  Down, Depressed, Hopeless 0 0 0  PHQ - 2 Score 0 0 0  Altered sleeping - - -  Tired, decreased energy - - -  Change in appetite - - -  Feeling bad or failure about yourself  - - -  Trouble concentrating - - -  Moving slowly or fidgety/restless - - -  Suicidal thoughts - - -  PHQ-9 Score - - -  Some recent data might be hidden   GAD 7 : Generalized Anxiety Score 11/01/2020 04/19/2020 01/20/2020 03/19/2019  Nervous, Anxious, on Edge 2 0 0 2  Control/stop worrying 1 0 0 1  Worry too much - different things 1 0 0 1  Trouble relaxing  1 0 1 0  Restless 0 0 0 0  Easily annoyed or irritable 1 0 0 0  Afraid - awful might happen 0 0 0 1  Total GAD 7 Score 6 0 1 5  Anxiety Difficulty Somewhat difficult - Not difficult at all -     History [name] has a past medical history of Acute on chronic diastolic CHF (congestive heart failure) ([place]) (03/05/2015), Anemia, Anxiety, Arthritis, CAD (coronary artery disease), Chronic kidney disease, Chronic neck pain, COPD (chronic obstructive pulmonary disease) ([place]), Degenerative joint disease, Diabetic neuropathy ([place]), Diverticulosis, Esophageal stricture, GERD (gastroesophageal reflux disease), Hyperlipidemia, Hypertension, IDDM (insulin dependent diabetes mellitus), Pericardial effusion (03/06/2015), Peripheral vascular disease ([place]), Stroke ([place]), and Vitamin D deficiency.   She has a past surgical history that includes Bladder surgery; Partial hysterectomy; Shoulder surgery (Right); Cervical spine surgery (2012); Abdominal hysterectomy; Laminectomy (12/29/2011); cardiac stents; Lumbar laminectomy/decompression microdiscectomy (12/29/2011); Back surgery; Cystoscopy with injection (N/A, 06/03/2013); Cardiac catheterization (2003, 2005, 2009); Cataract extraction w/PHACO (Right, 05/04/2014); Cataract extraction w/PHACO (Left, 07/20/2014); AV fistula placement (Right, 07/20/2015); Cardiac catheterization (N/A, 11/22/2015); Insertion of dialysis catheter (N/A, 11/26/2015); AV fistula placement (Right, 11/26/2015); Fistulogram (Right, 10/20/2016); A/V Fistulagram (N/A, 08/10/2017); PERIPHERAL VASCULAR  INTERVENTION (08/10/2017); A/V Fistulagram (N/A, 03/03/2020); and PERIPHERAL VASCULAR BALLOON ANGIOPLASTY (03/03/2020).   Her family history includes Cancer in her father; Diabetes in her daughter; Heart disease in an other family member; Hypertension in her daughter, daughter, father, and mother; Pancreatitis in her brother.She reports that she quit smoking about 19 years ago. She has a 30.00 pack-year smoking history.  She has never used smokeless tobacco. She reports that she does not drink alcohol and does not use drugs.    ROS Review of Systems  Constitutional: Negative.   HENT: Negative.   Eyes: Negative for visual disturbance.  Respiratory: Negative for shortness of breath.   Cardiovascular: Negative for chest pain (chest tightness noted toward the end of dialysis when they are using a lower dry weight.).  Gastrointestinal: Negative for abdominal pain.  Musculoskeletal: Negative for arthralgias.    Objective:  BP (!) 110/55   Pulse 66   Temp (!) 97.2 F (36.2 C)   Ht '5\' 3"'$  (1.6 m)   Wt 192 lb (87.1 kg)   SpO2 97%   BMI 34.01 kg/m   BP Readings from Last 3 Encounters:  03/02/21 (!) 110/55  02/28/21 (!) 168/80  02/21/21 (!) 163/70    Wt Readings from Last 3 Encounters:  03/02/21 192 lb (87.1 kg)  02/28/21 195 lb 3.2 oz (88.5 kg)  02/21/21 193 lb 2 oz (87.6 kg)     Physical Exam Constitutional:      General: She is not in acute distress.    Appearance: She is well-developed.  Cardiovascular:     Rate and Rhythm: Normal rate and regular rhythm.  Pulmonary:     Breath sounds: Normal breath sounds.  Skin:    General: Skin is warm and dry.  Neurological:     Mental Status: She is alert and oriented to person, place, and time.       Assessment & Plan:   [name] was seen today for follow-up.  Diagnoses and all orders for this visit:  End stage renal disease on dialysis due to type 2 diabetes mellitus (HCC)  GAD (generalized anxiety disorder) -     QUEtiapine (SEROQUEL) 50 MG tablet; Take 1 tablet (50 mg total) by mouth at bedtime. For sleep and for anxiety  Type 2 diabetes mellitus with hyperglycemia, with long-term current use of insulin (HCC)  Coronary artery disease involving native coronary artery of native heart without angina pectoris  Diabetic polyneuropathy associated with type 2 diabetes mellitus ([place])       I have discontinued Ivin Booty E. Stong's  pantoprazole. I have also changed her QUEtiapine. Additionally, I am having her maintain her aspirin, calcium acetate, nitroGLYCERIN, acetaminophen, Dialyvite 800, Carboxymethylcellul-Glycerin (LUBRICATING EYE DROPS OP), albuterol, OneTouch Ultra, cetirizine, atenolol, Breztri Aerosphere, Spiriva Respimat, albuterol, ondansetron, busPIRone, polyethylene glycol powder, Basaglar KwikPen, rosuvastatin, meclizine, isosorbide mononitrate, DULoxetine, gabapentin, and NIFEdipine.  Allergies as of 03/02/2021      Reactions   Azithromycin    Prolonged QT   Ace Inhibitors Cough   Clopidogrel Bisulfate Other (See Comments)   Sick, Headache, "Felt terrible"   Codeine Other (See Comments)   hallucinations   Lisinopril Cough   Penicillins Other (See Comments)   Unknown reaction Did it involve swelling of the face/tongue/throat, SOB, or low BP? Unknown Did it involve sudden or severe rash/hives, skin peeling, or any reaction on the inside of your mouth or nose? Unknown Did you need to seek medical attention at a hospital or doctor's office? Unknown When did it last happen?childhood  allergy If all above answers are "NO", may proceed with cephalosporin use.      Medication List       Accurate as of March 02, 2021 11:11 AM. If you have any questions, ask your nurse or doctor.        STOP taking these medications   pantoprazole 40 MG tablet Commonly known as: PROTONIX Stopped by: [name], MD     TAKE these medications   acetaminophen 500 MG tablet Commonly known as: TYLENOL Take 1,000 mg by mouth every 6 (six) hours as needed for moderate pain or headache.   albuterol (2.5 MG/3ML) 0.083% nebulizer solution Commonly known as: PROVENTIL Take 3 mLs (2.5 mg total) by nebulization every 6 (six) hours as needed for wheezing or shortness of breath.   albuterol 108 (90 Base) MCG/ACT inhaler Commonly known as: VENTOLIN HFA Inhale 2 puffs into the lungs every 6 (six) hours as needed for  wheezing or shortness of breath.   aspirin 81 MG EC tablet Take 81 mg by mouth daily.   atenolol 50 MG tablet Commonly known as: TENORMIN Take 1 tablet (50 mg total) by mouth 2 (two) times daily.   Basaglar KwikPen 100 UNIT/ML Inject 20 Units into the skin at bedtime. Add five units each time the fasting is over 125 three days in a row. What changed: how much to take   SunGard 160-9-4.8 MCG/ACT Aero Generic drug: Budeson-Glycopyrrol-Formoterol Inhale 2 puffs into the lungs in the morning and at bedtime.   busPIRone 7.5 MG tablet Commonly known as: BUSPAR Take 1 tablet (7.5 mg total) by mouth 3 (three) times daily as needed.   calcium acetate 667 MG capsule Commonly known as: PHOSLO Take 667-1,334 mg by mouth See admin instructions. Take 1334 mg with meals 3 times daily and 667 mg with snacks   cetirizine 10 MG tablet Commonly known as: ZYRTEC Take 1 tablet (10 mg total) by mouth daily as needed for allergies.   Dialyvite 800 0.8 MG Tabs Take 1 tablet by mouth daily.   DULoxetine 20 MG capsule Commonly known as: CYMBALTA Take 1 capsule (20 mg total) by mouth daily.   gabapentin 400 MG capsule Commonly known as: NEURONTIN Take 1 capsule (400 mg total) by mouth See admin instructions. Take it after HD   isosorbide mononitrate 30 MG 24 hr tablet Commonly known as: IMDUR Take 0.5 tablets (15 mg total) by mouth daily.   LUBRICATING EYE DROPS OP Place 1 drop into both eyes daily as needed (dry eyes).   meclizine 25 MG tablet Commonly known as: ANTIVERT Take 25 mg by mouth 3 (three) times daily.   NIFEdipine 60 MG 24 hr tablet Commonly known as: PROCARDIA XL/NIFEDICAL XL Take 1 tablet (60 mg total) by mouth daily. (ON NON-DIALYSIS DAYS)   nitroGLYCERIN 0.4 MG SL tablet Commonly known as: NITROSTAT Place 1 tablet (0.4 mg total) under the tongue every 5 (five) minutes as needed for chest pain.   ondansetron 4 MG tablet Commonly known as: ZOFRAN TAKE 1  TABLET BY MOUTH EVERY 8 HOURS AS NEEDED FOR NAUSEA AND VOMITING What changed: See the new instructions.   OneTouch Ultra test strip Generic drug: glucose blood Use to test blood sugar 3 times daily as directed. DX: E11.9   polyethylene glycol powder 17 GM/SCOOP powder Commonly known as: GLYCOLAX/MIRALAX Take 17 g by mouth 2 (two) times daily as needed for moderate constipation. For constipation   QUEtiapine 50 MG tablet Commonly known as: SEROQUEL Take 1  tablet (50 mg total) by mouth at bedtime. For sleep and for anxiety What changed:   medication strength  how much to take Changed by: [name], MD   rosuvastatin 5 MG tablet Commonly known as: Crestor Take 1 tablet (5 mg total) by mouth daily. For cholesterol   Spiriva Respimat 1.25 MCG/ACT Aers Generic drug: Tiotropium Bromide Monohydrate Inhale 2 puffs into the lungs daily.        Follow-up: Return in about 6 weeks (around 04/13/2021).  [name], M.D.

## 2021-03-03 DIAGNOSIS — N186 End stage renal disease: Secondary | ICD-10-CM | POA: Diagnosis not present

## 2021-03-03 DIAGNOSIS — Z992 Dependence on renal dialysis: Secondary | ICD-10-CM | POA: Diagnosis not present

## 2021-03-05 DIAGNOSIS — Z992 Dependence on renal dialysis: Secondary | ICD-10-CM | POA: Diagnosis not present

## 2021-03-05 DIAGNOSIS — D509 Iron deficiency anemia, unspecified: Secondary | ICD-10-CM | POA: Diagnosis not present

## 2021-03-05 DIAGNOSIS — N2581 Secondary hyperparathyroidism of renal origin: Secondary | ICD-10-CM | POA: Diagnosis not present

## 2021-03-05 DIAGNOSIS — D631 Anemia in chronic kidney disease: Secondary | ICD-10-CM | POA: Diagnosis not present

## 2021-03-05 DIAGNOSIS — E559 Vitamin D deficiency, unspecified: Secondary | ICD-10-CM | POA: Diagnosis not present

## 2021-03-05 DIAGNOSIS — N250 Renal osteodystrophy: Secondary | ICD-10-CM | POA: Diagnosis not present

## 2021-03-05 DIAGNOSIS — N186 End stage renal disease: Secondary | ICD-10-CM | POA: Diagnosis not present

## 2021-03-07 ENCOUNTER — Encounter: Payer: Self-pay | Admitting: Diagnostic Neuroimaging

## 2021-03-07 ENCOUNTER — Ambulatory Visit (INDEPENDENT_AMBULATORY_CARE_PROVIDER_SITE_OTHER): Payer: Medicare Other | Admitting: Diagnostic Neuroimaging

## 2021-03-07 VITALS — BP 135/61 | HR 45 | Ht 63.0 in | Wt 192.0 lb

## 2021-03-07 DIAGNOSIS — Z79899 Other long term (current) drug therapy: Secondary | ICD-10-CM

## 2021-03-07 DIAGNOSIS — M5416 Radiculopathy, lumbar region: Secondary | ICD-10-CM | POA: Diagnosis not present

## 2021-03-07 DIAGNOSIS — R6889 Other general symptoms and signs: Secondary | ICD-10-CM

## 2021-03-07 DIAGNOSIS — I200 Unstable angina: Secondary | ICD-10-CM | POA: Diagnosis not present

## 2021-03-07 DIAGNOSIS — E1142 Type 2 diabetes mellitus with diabetic polyneuropathy: Secondary | ICD-10-CM | POA: Diagnosis not present

## 2021-03-07 NOTE — Progress Notes (Signed)
[facility]  PATIENT: [name] DOB: 1951/11/11  REFERRING CLINICIAN: [name], MD HISTORY FROM: patient  REASON FOR VISIT: new consult    HISTORICAL  CHIEF COMPLAINT:  Chief Complaint  Patient presents with  . Diabetic neuropathy    Rm 7 New Pt  "my legs hurt all the time; can't walk much; feels like its coming from my back down"    HISTORY OF PRESENT ILLNESS:   70 year old female here for evaluation of lower extremity pain.  Patient has history of diabetes, end-stage renal disease, heart disease, hypertension and anxiety.  Patient has had chronic low back pain rating to the lower extremities for the past 10+ years, status post 2 lumbar spine surgeries.  Symptoms progressively worsening over time.  She feels like her pain is coming from her back down to her legs.  No burning or tingling in the toes or feet.   REVIEW OF SYSTEMS: Full 14 system review of systems performed and negative with exception of: as per HPI.  ALLERGIES: Allergies  Allergen Reactions  . Azithromycin     Prolonged QT  . Ace Inhibitors Cough  . Clopidogrel Bisulfate Other (See Comments)    Sick, Headache, "Felt terrible"  . Codeine Other (See Comments)    hallucinations  . Lisinopril Cough  . Penicillins Other (See Comments)    Unknown reaction Did it involve swelling of the face/tongue/throat, SOB, or low BP? Unknown Did it involve sudden or severe rash/hives, skin peeling, or any reaction on the inside of your mouth or nose? Unknown Did you need to seek medical attention at a hospital or doctor's office? Unknown When did it last happen?childhood allergy If all above answers are "NO", may proceed with cephalosporin use.      HOME MEDICATIONS: Outpatient Medications Prior to Visit  Medication Sig Dispense Refill  . acetaminophen (TYLENOL) 500 MG tablet Take 1,000 mg by mouth every 6 (six) hours as needed for moderate pain or headache.    . albuterol  (PROVENTIL) (2.5 MG/3ML) 0.083% nebulizer solution Take 3 mLs (2.5 mg total) by nebulization every 6 (six) hours as needed for wheezing or shortness of breath. 75 mL 12  . albuterol (VENTOLIN HFA) 108 (90 Base) MCG/ACT inhaler Inhale 2 puffs into the lungs every 6 (six) hours as needed for wheezing or shortness of breath. 1 each 2  . aspirin 81 MG EC tablet Take 81 mg by mouth daily.    [name] atenolol (TENORMIN) 50 MG tablet Take 1 tablet (50 mg total) by mouth 2 (two) times daily. 180 tablet 1  . B Complex-C-Folic Acid (DIALYVITE Q000111Q) 0.8 MG TABS Take 1 tablet by mouth daily.    . busPIRone (BUSPAR) 7.5 MG tablet Take 1 tablet (7.5 mg total) by mouth 3 (three) times daily as needed. 90 tablet 1  . calcium acetate (PHOSLO) 667 MG capsule Take 667-1,334 mg by mouth See admin instructions. Take 1334 mg with meals 3 times daily and 667 mg with snacks    . Carboxymethylcellul-Glycerin (LUBRICATING EYE DROPS OP) Place 1 drop into both eyes daily as needed (dry eyes).    . cetirizine (ZYRTEC) 10 MG tablet Take 1 tablet (10 mg total) by mouth daily as needed for allergies. 90 tablet 3  . DULoxetine (CYMBALTA) 20 MG capsule Take 1 capsule (20 mg total) by mouth daily. 30 capsule 3  . gabapentin (NEURONTIN) 400 MG capsule Take 1 capsule (400 mg total) by mouth See admin instructions. Take it after HD 30  capsule 1  . glucose blood (ONETOUCH ULTRA) test strip Use to test blood sugar 3 times daily as directed. DX: E11.9 300 each 12  . Insulin Glargine (BASAGLAR KWIKPEN) 100 UNIT/ML Inject 20 Units into the skin at bedtime. Add five units each time the fasting is over 125 three days in a row. (Patient taking differently: Inject 30 Units into the skin at bedtime. Add five units each time the fasting is over 125 three days in a row.) 30 mL 1  . isosorbide mononitrate (IMDUR) 30 MG 24 hr tablet Take 0.5 tablets (15 mg total) by mouth daily. 30 tablet 1  . meclizine (ANTIVERT) 25 MG tablet Take 25 mg by mouth 3 (three)  times daily.    [name] NIFEdipine (PROCARDIA XL/NIFEDICAL XL) 60 MG 24 hr tablet Take 1 tablet (60 mg total) by mouth daily. (ON NON-DIALYSIS DAYS) 30 tablet 6  . nitroGLYCERIN (NITROSTAT) 0.4 MG SL tablet Place 1 tablet (0.4 mg total) under the tongue every 5 (five) minutes as needed for chest pain. 25 tablet 11  . ondansetron (ZOFRAN) 4 MG tablet TAKE 1 TABLET BY MOUTH EVERY 8 HOURS AS NEEDED FOR NAUSEA AND VOMITING (Patient taking differently: Take 4 mg by mouth every 8 (eight) hours as needed.) 20 tablet 0  . polyethylene glycol powder (GLYCOLAX/MIRALAX) 17 GM/SCOOP powder Take 17 g by mouth 2 (two) times daily as needed for moderate constipation. For constipation 3350 g 5  . QUEtiapine (SEROQUEL) 50 MG tablet Take 1 tablet (50 mg total) by mouth at bedtime. For sleep and for anxiety 30 tablet 2  . rosuvastatin (CRESTOR) 5 MG tablet Take 1 tablet (5 mg total) by mouth daily. For cholesterol 90 tablet 3  . Tiotropium Bromide Monohydrate (SPIRIVA RESPIMAT) 1.25 MCG/ACT AERS Inhale 2 puffs into the lungs daily. 8 g 0  . Budeson-Glycopyrrol-Formoterol (BREZTRI AEROSPHERE) 160-9-4.8 MCG/ACT AERO Inhale 2 puffs into the lungs in the morning and at bedtime. (Patient not taking: Reported on 03/07/2021) 32.1 g 11   No facility-administered medications prior to visit.    PAST MEDICAL HISTORY: Past Medical History:  Diagnosis Date  . Acute on chronic diastolic CHF (congestive heart failure) ([place]) 03/05/2015   Grade 2. EF 60-65%  . Anemia    hx of  . Anxiety   . Arthritis   . CAD (coronary artery disease)    s/p Stenting in 2005;  [place] 7/09: Vigorous LV function, 2-3+ MR, RI 40%, RI stent patent, proximal-mid RCA 40-50%;  [place] 7/09:  EF 55-65%, trivial MR;  [place] 11/18/12: EF 66%, normal LV wall motion, mild to moderate anterior ischemia - reviewed by [facility] and felt to rep breast atten (low risk);  Echo 6/14: mild LVH, EF 55-65%, Gr 2 DD, PASP 44, trivial eff   . Chronic kidney disease    CREATININE IS  UP--GOING TO KIDNEY MD   . Chronic neck pain   . COPD (chronic obstructive pulmonary disease) ([place])   . Degenerative joint disease    left shoulder  . Diabetic neuropathy ([place])   . Diverticulosis   . Esophageal stricture   . GERD (gastroesophageal reflux disease)   . Hyperlipidemia   . Hypertension   . IDDM (insulin dependent diabetes mellitus)   . Pericardial effusion 03/06/2015   Very small per echo ; pleural nodules seen on CT chest.  . Peripheral vascular disease ([place])   . Stroke [facility])    "mini- years ago"  . Vitamin D deficiency     PAST SURGICAL HISTORY:  Past Surgical History:  Procedure Laterality Date  . A/V FISTULAGRAM N/A 08/10/2017   Procedure: A/V Fistulagram - Right Arm;  Surgeon: Elam Dutch, MD;  Location: [place] [facility];  Service: Cardiovascular;  Laterality: N/A;  . A/V FISTULAGRAM N/A 03/03/2020   Procedure: A/V FISTULAGRAM - Right Arm;  Surgeon: Marty Heck, MD;  Location: [place] [facility];  Service: Cardiovascular;  Laterality: N/A;  . ABDOMINAL HYSTERECTOMY    . AV FISTULA PLACEMENT Right 07/20/2015   Procedure: RADIAL CEPHALIC ARTERIOVENOUS FISTULA CREATION RIGHT ARM;  Surgeon: Angelia Mould, MD;  Location: [place];  Service: Vascular;  Laterality: Right;  . AV FISTULA PLACEMENT Right 11/26/2015   Procedure:  Creation of RIGHT BRACHIO-CEPHALIC arteriovenous fistula;  Surgeon: Angelia Mould, MD;  Location: [place];  Service: Vascular;  Laterality: Right;  . BACK SURGERY     2007 & 2013  . BLADDER SURGERY     Tack  . CARDIAC CATHETERIZATION  2003, 2005, 2009   1 stent  . cardiac stents    . CATARACT EXTRACTION W/PHACO Right 05/04/2014   Procedure: CATARACT EXTRACTION PHACO AND INTRAOCULAR LENS PLACEMENT (IOC);  Surgeon: Williams Che, MD;  Location: [facility];  Service: Ophthalmology;  Laterality: Right;  CDE 1.47  . CATARACT EXTRACTION W/PHACO Left 07/20/2014   Procedure: CATARACT EXTRACTION WITH PHACO AND INTRAOCULAR LENS  PLACEMENT LEFT EYE CDE=1.79;  Surgeon: Williams Che, MD;  Location: [facility];  Service: Ophthalmology;  Laterality: Left;  . CERVICAL SPINE SURGERY  2012   8 screws  . CYSTOSCOPY WITH INJECTION N/A 06/03/2013   Procedure: MACROPLASTIQUE  INJECTION ;  Surgeon: Malka So, MD;  Location: [facility];  Service: Urology;  Laterality: N/A;  . FISTULOGRAM Right 10/20/2016   Procedure: FISTULOGRAM WITH POSSIBLE INTERVENTION;  Surgeon: [name], MD;  Location: [facility];  Service: Vascular;  Laterality: Right;  . INSERTION OF DIALYSIS CATHETER N/A 11/26/2015   Procedure: INSERTION OF DIALYSIS CATHETER;  Surgeon: Angelia Mould, MD;  Location: [place];  Service: Vascular;  Laterality: N/A;  . LAMINECTOMY  12/29/2011  . LUMBAR LAMINECTOMY/DECOMPRESSION MICRODISCECTOMY  12/29/2011   Procedure: LUMBAR LAMINECTOMY/DECOMPRESSION MICRODISCECTOMY;  Surgeon: Floyce Stakes, MD;  Location: [place] [facility];  Service: Neurosurgery;  Laterality: N/A;  Lumbar Two through Lumbar Five Laminectomies/Cellsaver  . PARTIAL HYSTERECTOMY    . PERIPHERAL VASCULAR BALLOON ANGIOPLASTY  03/03/2020   Procedure: PERIPHERAL VASCULAR BALLOON ANGIOPLASTY;  Surgeon: Marty Heck, MD;  Location: [place] [facility];  Service: Cardiovascular;;  Rt arm fistula  . PERIPHERAL VASCULAR CATHETERIZATION N/A 11/22/2015   Procedure: Fistulagram;  Surgeon: Angelia Mould, MD;  Location: [place] [facility];  Service: Cardiovascular;  Laterality: N/A;  . PERIPHERAL VASCULAR INTERVENTION  08/10/2017   Procedure: PERIPHERAL VASCULAR INTERVENTION;  Surgeon: Elam Dutch, MD;  Location: [place] [facility];  Service: Cardiovascular;;  . SHOULDER SURGERY Right    Rotator cuff    FAMILY HISTORY: Family History  Problem Relation Age of Onset  . Heart disease Other   . Hypertension Father   . Cancer Father   . Hypertension Mother   . Hypertension Daughter   . Pancreatitis Brother   . Hypertension Daughter   . Diabetes  Daughter     SOCIAL HISTORY: Social History   Socioeconomic History  . Marital status: Widowed    Spouse name: Not on file  . Number of children: 2  . Years of education: GED  . Highest education  level: GED or equivalent  Occupational History  . Occupation: Disabled  Tobacco Use  . Smoking status: Former Smoker    Packs/day: 1.00    Years: 30.00    Pack years: 30.00    Quit date: 12/04/2001    Years since quitting: 19.2  . Smokeless tobacco: Never Used  Vaping Use  . Vaping Use: Never used  Substance and Sexual Activity  . Alcohol use: No    Alcohol/week: 6.0 standard drinks    Types: 6 Cans of beer per week  . Drug use: No  . Sexual activity: Not Currently    Birth control/protection: Surgical  Other Topics Concern  . Not on file  Social History Narrative   Lives in [place] with mother   Social Determinants of Health   Financial Resource Strain: Low Risk   . Difficulty of Paying Living Expenses: Not hard at all  Food Insecurity: Not on file  Transportation Needs: No Transportation Needs  . Lack of Transportation (Medical): No  . Lack of Transportation (Non-Medical): No  Physical Activity: Not on file  Stress: No Stress Concern Present  . Feeling of Stress : Not at all  Social Connections: Not on file  Intimate Partner Violence: Not on file     PHYSICAL EXAM  GENERAL EXAM/CONSTITUTIONAL: Vitals:  Vitals:   03/07/21 1111  BP: 135/61  Pulse: (!) 45  Weight: 192 lb (87.1 kg)  Height: '5\' 3"'$  (1.6 m)   Body mass index is 34.01 kg/m. Wt Readings from Last 3 Encounters:  03/07/21 192 lb (87.1 kg)  03/02/21 192 lb (87.1 kg)  02/28/21 195 lb 3.2 oz (88.5 kg)    Patient is in no distress; well developed, nourished and groomed; neck is supple  CARDIOVASCULAR:  Examination of carotid arteries is normal; no carotid bruits  Regular rate and rhythm, no murmurs  Examination of peripheral vascular system by observation and palpation is  normal  EYES:  Ophthalmoscopic exam of optic discs and posterior segments is normal; no papilledema or hemorrhages No exam data present  MUSCULOSKELETAL:  Gait, strength, tone, movements noted in Neurologic exam below  NEUROLOGIC: MENTAL STATUS:  MMSE - Brush Exam 06/17/2018 10/06/2016  Orientation to time 5 5  Orientation to Place 5 5  Registration 3 3  Attention/ Calculation 5 5  Recall 1 3  Language- name 2 objects 2 2  Language- repeat 1 1  Language- follow 3 step command 3 3  Language- read & follow direction 1 1  Write a sentence 1 1  Copy design 1 1  Total score 28 30    awake, alert, oriented to person, place and time  recent and remote memory intact  normal attention and concentration  language fluent, comprehension intact, naming intact  fund of knowledge appropriate  CRANIAL NERVE:   2nd - no papilledema on fundoscopic exam  2nd, 3rd, 4th, 6th - pupils equal and reactive to light, visual fields full to confrontation, extraocular muscles intact, no nystagmus  5th - facial sensation symmetric  7th - facial strength symmetric  8th - hearing intact  9th - palate elevates symmetrically, uvula midline  11th - shoulder shrug symmetric  12th - tongue protrusion midline  MOTOR:   normal bulk and tone, full strength in the BUE, BLE  SENSORY:   normal and symmetric to light touch, temperature, vibration  COORDINATION:   finger-nose-finger, fine finger movements normal  REFLEXES:   deep tendon reflexes present and symmetric  GAIT/STATION:   narrow  based gait     DIAGNOSTIC DATA (LABS, IMAGING, TESTING) - I reviewed patient records, labs, notes, testing and imaging myself where available.  Lab Results  Component Value Date   WBC 11.3 (H) 02/20/2021   HGB 9.3 (L) 02/20/2021   HCT 30.0 (L) 02/20/2021   MCV 94.9 02/20/2021   PLT 205 02/20/2021      Component Value Date/Time   NA 135 02/19/2021 1917   NA 138 01/31/2021  1001   K 4.5 02/19/2021 1917   CL 93 (L) 02/19/2021 1917   CO2 27 02/19/2021 1917   GLUCOSE 269 (H) 02/19/2021 1917   BUN 48 (H) 02/19/2021 1917   BUN 69 (H) 01/31/2021 1001   CREATININE 9.62 (H) 02/19/2021 1917   CREATININE 1.66 (H) 03/25/2013 1026   CALCIUM 9.3 02/19/2021 1917   PROT 6.8 02/19/2021 1917   PROT 6.3 01/31/2021 1001   ALBUMIN 3.3 (L) 02/19/2021 1917   ALBUMIN 3.9 01/31/2021 1001   AST 17 02/19/2021 1917   ALT 21 02/19/2021 1917   ALKPHOS 87 02/19/2021 1917   BILITOT 0.2 (L) 02/19/2021 1917   BILITOT 0.2 01/31/2021 1001   GFRNONAA 4 (L) 02/19/2021 1917   GFRAA 4 (L) 11/01/2020 1022   Lab Results  Component Value Date   CHOL 203 (H) 01/31/2021   HDL 36 (L) 01/31/2021   LDLCALC 124 (H) 01/31/2021   TRIG 240 (H) 01/31/2021   CHOLHDL 5.6 (H) 01/31/2021   Lab Results  Component Value Date   HGBA1C 7.9 (H) 02/20/2021   Lab Results  Component Value Date   VITAMINB12 394 03/07/2015   Lab Results  Component Value Date   TSH 2.261 03/05/2015      ASSESSMENT AND PLAN  70 y.o. year old female here with:  Dx:  1. Chronic lumbar radiculopathy   2. Diabetic polyneuropathy associated with type 2 diabetes mellitus (HCC)   3. Long-term use of high-risk medication   4. Other general symptoms and signs      PLAN:  LOW BACK PAIN --> BLE (lumbar radiculopathies, s/p lumbar surgery x 2; also mild neuropathy due to ESRD and DM) - patient reluctant to pursue surgery; consider PT evaluation - continue pain mgmt (gabapentin, cymbalta); challenging medication options due to ESRD on HD; consider pain mgmt clinic eval - EMG/NCS considered, but not likely to change mgmt; lumbar radiculopathies and neuropathies already confirmed based on history and exam findings - check neuropathy labs  Orders Placed This Encounter  Procedures  . Vitamin B12  . TSH   Return for pending if symptoms worsen or fail to improve, return to PCP.    Penni Bombard, MD [id number],  [id number] PM Certified in Neurology, Neurophysiology and Neuroimaging  [facility] [address] [place], [place] [address] [phone]

## 2021-03-07 NOTE — Patient Instructions (Signed)
LOW BACK PAIN --> BLE (lumbar radiculopathies, s/p lumbar surgery x 2; also mild neuropathy due to ESRD and DM) - continue pain mgmt (gabapentin, cymbalta) - check labs

## 2021-03-08 ENCOUNTER — Encounter: Payer: Self-pay | Admitting: *Deleted

## 2021-03-08 LAB — TSH: TSH: 2.45 u[IU]/mL (ref 0.450–4.500)

## 2021-03-08 LAB — VITAMIN B12: Vitamin B-12: 686 pg/mL (ref 232–1245)

## 2021-03-09 ENCOUNTER — Encounter: Payer: Self-pay | Admitting: *Deleted

## 2021-03-29 DIAGNOSIS — Z992 Dependence on renal dialysis: Secondary | ICD-10-CM | POA: Diagnosis not present

## 2021-04-02 DIAGNOSIS — Z992 Dependence on renal dialysis: Secondary | ICD-10-CM | POA: Diagnosis not present

## 2021-04-02 DIAGNOSIS — N186 End stage renal disease: Secondary | ICD-10-CM | POA: Diagnosis not present

## 2021-04-04 ENCOUNTER — Other Ambulatory Visit: Payer: Self-pay

## 2021-04-04 DIAGNOSIS — I739 Peripheral vascular disease, unspecified: Secondary | ICD-10-CM

## 2021-04-05 DIAGNOSIS — Z992 Dependence on renal dialysis: Secondary | ICD-10-CM | POA: Diagnosis not present

## 2021-04-05 DIAGNOSIS — D509 Iron deficiency anemia, unspecified: Secondary | ICD-10-CM | POA: Diagnosis not present

## 2021-04-05 DIAGNOSIS — N186 End stage renal disease: Secondary | ICD-10-CM | POA: Diagnosis not present

## 2021-04-05 DIAGNOSIS — E559 Vitamin D deficiency, unspecified: Secondary | ICD-10-CM | POA: Diagnosis not present

## 2021-04-05 DIAGNOSIS — D631 Anemia in chronic kidney disease: Secondary | ICD-10-CM | POA: Diagnosis not present

## 2021-04-11 ENCOUNTER — Ambulatory Visit (INDEPENDENT_AMBULATORY_CARE_PROVIDER_SITE_OTHER): Payer: Medicare Other | Admitting: Pulmonary Disease

## 2021-04-11 ENCOUNTER — Other Ambulatory Visit: Payer: Self-pay

## 2021-04-11 ENCOUNTER — Encounter: Payer: Self-pay | Admitting: Pulmonary Disease

## 2021-04-11 VITALS — BP 126/68 | HR 74 | Temp 97.8°F | Ht 63.0 in | Wt 193.6 lb

## 2021-04-11 DIAGNOSIS — J4540 Moderate persistent asthma, uncomplicated: Secondary | ICD-10-CM | POA: Diagnosis not present

## 2021-04-11 DIAGNOSIS — R942 Abnormal results of pulmonary function studies: Secondary | ICD-10-CM

## 2021-04-11 LAB — CBC WITH DIFFERENTIAL/PLATELET
Basophils Absolute: 0.1 10*3/uL (ref 0.0–0.1)
Basophils Relative: 0.5 % (ref 0.0–3.0)
Eosinophils Absolute: 1.2 10*3/uL — ABNORMAL HIGH (ref 0.0–0.7)
Eosinophils Relative: 10.3 % — ABNORMAL HIGH (ref 0.0–5.0)
HCT: 28.9 % — ABNORMAL LOW (ref 36.0–46.0)
Hemoglobin: 9.5 g/dL — ABNORMAL LOW (ref 12.0–15.0)
Lymphocytes Relative: 20.7 % (ref 12.0–46.0)
Lymphs Abs: 2.4 10*3/uL (ref 0.7–4.0)
MCHC: 33 g/dL (ref 30.0–36.0)
MCV: 91.5 fl (ref 78.0–100.0)
Monocytes Absolute: 0.8 10*3/uL (ref 0.1–1.0)
Monocytes Relative: 7.3 % (ref 3.0–12.0)
Neutro Abs: 7.1 10*3/uL (ref 1.4–7.7)
Neutrophils Relative %: 61.2 % (ref 43.0–77.0)
Platelets: 258 10*3/uL (ref 150.0–400.0)
RBC: 3.16 Mil/uL — ABNORMAL LOW (ref 3.87–5.11)
RDW: 14.9 % (ref 11.5–15.5)
WBC: 11.5 10*3/uL — ABNORMAL HIGH (ref 4.0–10.5)

## 2021-04-11 MED ORDER — PREDNISONE 10 MG PO TABS: 10.0000 mg | ORAL_TABLET | Freq: Every day | ORAL | 0 refills | Status: DC

## 2021-04-11 MED ORDER — MONTELUKAST SODIUM 10 MG PO TABS: 10.0000 mg | ORAL_TABLET | Freq: Every day | ORAL | 11 refills | Status: DC

## 2021-04-11 MED ORDER — BREZTRI AEROSPHERE 160-9-4.8 MCG/ACT IN AERO: 2.0000 | INHALATION_SPRAY | Freq: Two times a day (BID) | RESPIRATORY_TRACT | 0 refills | Status: DC

## 2021-04-11 NOTE — Patient Instructions (Addendum)
Use breztri 2 puffs twice daily  If you are not using breztri then use symbicort 2 puffs twice daily and spiriva 2 puffs daily.   Continue to use albuterol as needed   Prednisone taper: '30mg'$  daily for 3 days '20mg'$  daily for 3 days '10mg'$  daily for 3 days  Start montelukast '10mg'$  daily for allergies and wheezing.  We will check your lab work today.

## 2021-04-11 NOTE — Progress Notes (Signed)
Synopsis: Referred in 10/2020 by [name], MD for [place]  Subjective:   PATIENT ID: [name] GENDER: female DOB: 1951/10/15, MRN: [id number]   HPI  Chief Complaint  Patient presents with  . Follow-up    Wheezing and SOB at times   [name] is a 70 year old woman, former smoker with history of coronary artery disease, heart failure preserved EF, ESRD on HD, GERD, hypertension and diabetes mellitus type II who returns to pulmonary clinic for evaluation of shortness of breath.   She had pulmonary function testing done on 12/13/20 which showed mild diffusion defect. Her spirometry and lung capacity are normal.   She reports improvement in her shortness of breath with the addition of spiriva to her symbicort. She has not been able to obtain breztri yet and has continued to take spiriva + symbicort.   She does report increased wheezing and shortness of breath recently with the spring pollen.   She had a home sleep study on 12/29/20 which did not indicate sleep apnea.     Past Medical History:  Diagnosis Date  . Acute on chronic diastolic CHF (congestive heart failure) ([place]) 03/05/2015   Grade 2. EF 60-65%  . Anemia    hx of  . Anxiety   . Arthritis   . CAD (coronary artery disease)    s/p Stenting in 2005;  [place] 7/09: Vigorous LV function, 2-3+ MR, RI 40%, RI stent patent, proximal-mid RCA 40-50%;  [place] 7/09:  EF 55-65%, trivial MR;  [place] 11/18/12: EF 66%, normal LV wall motion, mild to moderate anterior ischemia - reviewed by [facility] and felt to rep breast atten (low risk);  Echo 6/14: mild LVH, EF 55-65%, Gr 2 DD, PASP 44, trivial eff   . Chronic kidney disease    CREATININE IS UP--GOING TO KIDNEY MD   . Chronic neck pain   . COPD (chronic obstructive pulmonary disease) ([place])   . Degenerative joint disease    left shoulder  . Diabetic neuropathy ([place])   . Diverticulosis   . Esophageal stricture   . GERD (gastroesophageal reflux disease)   . Hyperlipidemia   .  Hypertension   . IDDM (insulin dependent diabetes mellitus)   . Pericardial effusion 03/06/2015   Very small per echo ; pleural nodules seen on CT chest.  . Peripheral vascular disease ([place])   . Stroke [facility])    "mini- years ago"  . Vitamin D deficiency      Family History  Problem Relation Age of Onset  . Heart disease Other   . Hypertension Father   . Cancer Father   . Hypertension Mother   . Hypertension Daughter   . Pancreatitis Brother   . Hypertension Daughter   . Diabetes Daughter      Social History   Socioeconomic History  . Marital status: Widowed    Spouse name: Not on file  . Number of children: 2  . Years of education: GED  . Highest education level: GED or equivalent  Occupational History  . Occupation: Disabled  Tobacco Use  . Smoking status: Former Smoker    Packs/day: 1.00    Years: 30.00    Pack years: 30.00    Quit date: 12/04/2001    Years since quitting: 19.3  . Smokeless tobacco: Never Used  Vaping Use  . Vaping Use: Never used  Substance and Sexual Activity  . Alcohol use: No    Alcohol/week: 6.0 standard drinks    Types: 6 Cans  of beer per week  . Drug use: No  . Sexual activity: Not Currently    Birth control/protection: Surgical  Other Topics Concern  . Not on file  Social History Narrative   Lives in [place] with mother   Social Determinants of Health   Financial Resource Strain: Low Risk   . Difficulty of Paying Living Expenses: Not hard at all  Food Insecurity: Not on file  Transportation Needs: No Transportation Needs  . Lack of Transportation (Medical): No  . Lack of Transportation (Non-Medical): No  Physical Activity: Not on file  Stress: No Stress Concern Present  . Feeling of Stress : Not at all  Social Connections: Not on file  Intimate Partner Violence: Not on file     Allergies  Allergen Reactions  . Azithromycin     Prolonged QT  . Ace Inhibitors Cough  . Clopidogrel Bisulfate Other (See Comments)    Sick,  Headache, "Felt terrible"  . Codeine Other (See Comments)    hallucinations  . Lisinopril Cough  . Penicillins Other (See Comments)    Unknown reaction Did it involve swelling of the face/tongue/throat, SOB, or low BP? Unknown Did it involve sudden or severe rash/hives, skin peeling, or any reaction on the inside of your mouth or nose? Unknown Did you need to seek medical attention at a hospital or doctor's office? Unknown When did it last happen?childhood allergy If all above answers are "NO", may proceed with cephalosporin use.       Outpatient Medications Prior to Visit  Medication Sig Dispense Refill  . acetaminophen (TYLENOL) 500 MG tablet Take 1,000 mg by mouth every 6 (six) hours as needed for moderate pain or headache.    . albuterol (PROVENTIL) (2.5 MG/3ML) 0.083% nebulizer solution Take 3 mLs (2.5 mg total) by nebulization every 6 (six) hours as needed for wheezing or shortness of breath. 75 mL 12  . albuterol (VENTOLIN HFA) 108 (90 Base) MCG/ACT inhaler Inhale 2 puffs into the lungs every 6 (six) hours as needed for wheezing or shortness of breath. 1 each 2  . aspirin 81 MG EC tablet Take 81 mg by mouth daily.    [name] atenolol (TENORMIN) 50 MG tablet Take 1 tablet (50 mg total) by mouth 2 (two) times daily. 180 tablet 1  . B Complex-C-Folic Acid (DIALYVITE Q000111Q) 0.8 MG TABS Take 1 tablet by mouth daily.    . Budeson-Glycopyrrol-Formoterol (BREZTRI AEROSPHERE) 160-9-4.8 MCG/ACT AERO Inhale 2 puffs into the lungs in the morning and at bedtime. 32.1 g 11  . busPIRone (BUSPAR) 7.5 MG tablet Take 1 tablet (7.5 mg total) by mouth 3 (three) times daily as needed. 90 tablet 1  . calcium acetate (PHOSLO) 667 MG capsule Take 667-1,334 mg by mouth See admin instructions. Take 1334 mg with meals 3 times daily and 667 mg with snacks    . Carboxymethylcellul-Glycerin (LUBRICATING EYE DROPS OP) Place 1 drop into both eyes daily as needed (dry eyes).    . cetirizine (ZYRTEC) 10 MG tablet Take  1 tablet (10 mg total) by mouth daily as needed for allergies. 90 tablet 3  . DULoxetine (CYMBALTA) 20 MG capsule Take 1 capsule (20 mg total) by mouth daily. 30 capsule 3  . gabapentin (NEURONTIN) 400 MG capsule Take 1 capsule (400 mg total) by mouth See admin instructions. Take it after HD 30 capsule 1  . glucose blood (ONETOUCH ULTRA) test strip Use to test blood sugar 3 times daily as directed. DX: E11.9 300 each 12  .  Insulin Glargine (BASAGLAR KWIKPEN) 100 UNIT/ML Inject 20 Units into the skin at bedtime. Add five units each time the fasting is over 125 three days in a row. (Patient taking differently: Inject 30 Units into the skin at bedtime. Add five units each time the fasting is over 125 three days in a row.) 30 mL 1  . isosorbide mononitrate (IMDUR) 30 MG 24 hr tablet Take 0.5 tablets (15 mg total) by mouth daily. 30 tablet 1  . meclizine (ANTIVERT) 25 MG tablet Take 25 mg by mouth 3 (three) times daily.    [name] NIFEdipine (PROCARDIA XL/NIFEDICAL XL) 60 MG 24 hr tablet Take 1 tablet (60 mg total) by mouth daily. (ON NON-DIALYSIS DAYS) 30 tablet 6  . nitroGLYCERIN (NITROSTAT) 0.4 MG SL tablet Place 1 tablet (0.4 mg total) under the tongue every 5 (five) minutes as needed for chest pain. 25 tablet 11  . ondansetron (ZOFRAN) 4 MG tablet TAKE 1 TABLET BY MOUTH EVERY 8 HOURS AS NEEDED FOR NAUSEA AND VOMITING (Patient taking differently: Take 4 mg by mouth every 8 (eight) hours as needed.) 20 tablet 0  . polyethylene glycol powder (GLYCOLAX/MIRALAX) 17 GM/SCOOP powder Take 17 g by mouth 2 (two) times daily as needed for moderate constipation. For constipation 3350 g 5  . QUEtiapine (SEROQUEL) 50 MG tablet Take 1 tablet (50 mg total) by mouth at bedtime. For sleep and for anxiety 30 tablet 2  . rosuvastatin (CRESTOR) 5 MG tablet Take 1 tablet (5 mg total) by mouth daily. For cholesterol 90 tablet 3  . Tiotropium Bromide Monohydrate (SPIRIVA RESPIMAT) 1.25 MCG/ACT AERS Inhale 2 puffs into the lungs  daily. 8 g 0   No facility-administered medications prior to visit.    Review of Systems  Constitutional: Negative for chills, diaphoresis, fever, malaise/fatigue and weight loss.  HENT: Negative for congestion, sinus pain and sore throat.   Eyes: Negative.   Respiratory: Positive for shortness of breath and wheezing. Negative for cough, hemoptysis and sputum production.   Cardiovascular: Negative for chest pain, orthopnea, leg swelling and PND.  Gastrointestinal: Negative for abdominal pain, heartburn, nausea and vomiting.  Musculoskeletal: Negative for joint pain and myalgias.  Skin: Negative for itching and rash.  Neurological: Negative.   Endo/Heme/Allergies: Negative.   Psychiatric/Behavioral: Negative.    Objective:   Vitals:   04/11/21 1042  BP: 126/68  Pulse: 74  Temp: 97.8 F (36.6 C)  TempSrc: Oral  SpO2: 96%  Weight: 193 lb 9.6 oz (87.8 kg)  Height: '5\' 3"'$  (1.6 m)     Physical Exam Constitutional:      General: She is not in acute distress.    Appearance: Normal appearance. She is obese. She is not ill-appearing.  HENT:     Head: Normocephalic and atraumatic.     Nose: Nose normal.     Mouth/Throat:     Mouth: Mucous membranes are moist.     Pharynx: Oropharynx is clear.  Eyes:     General: No scleral icterus.    Conjunctiva/sclera: Conjunctivae normal.     Pupils: Pupils are equal, round, and reactive to light.  Cardiovascular:     Rate and Rhythm: Normal rate and regular rhythm.     Pulses: Normal pulses.     Heart sounds: Normal heart sounds. No murmur heard.   Pulmonary:     Effort: Pulmonary effort is normal.     Breath sounds: Decreased air movement present. Rhonchi present. No wheezing or rales.  Abdominal:  General: Bowel sounds are normal.     Palpations: Abdomen is soft.  Musculoskeletal:     Cervical back: Neck supple.     Right lower leg: No edema.     Left lower leg: No edema.  Lymphadenopathy:     Cervical: No cervical  adenopathy.  Skin:    Capillary Refill: Capillary refill takes less than 2 seconds.     Findings: No lesion or rash.  Neurological:     General: No focal deficit present.     Mental Status: She is alert and oriented to person, place, and time. Mental status is at baseline.  Psychiatric:        Mood and Affect: Mood normal.        Behavior: Behavior normal.        Thought Content: Thought content normal.        Judgment: Judgment normal.    CBC    Component Value Date/Time   WBC 11.5 (H) 04/11/2021 1124   RBC 3.16 (L) 04/11/2021 1124   HGB 9.5 (L) 04/11/2021 1124   HGB 10.1 (L) 01/31/2021 1001   HCT 28.9 (L) 04/11/2021 1124   HCT 31.2 (L) 01/31/2021 1001   PLT 258.0 04/11/2021 1124   PLT 263 01/31/2021 1001   MCV 91.5 04/11/2021 1124   MCV 91 01/31/2021 1001   MCH 29.4 02/20/2021 0413   MCHC 33.0 04/11/2021 1124   RDW 14.9 04/11/2021 1124   RDW 14.4 01/31/2021 1001   LYMPHSABS 2.4 04/11/2021 1124   LYMPHSABS 2.8 01/31/2021 1001   MONOABS 0.8 04/11/2021 1124   EOSABS 1.2 (H) 04/11/2021 1124   EOSABS 1.2 (H) 01/31/2021 1001   BASOSABS 0.1 04/11/2021 1124   BASOSABS 0.1 01/31/2021 1001   BMP Latest Ref Rng & Units 02/19/2021 01/31/2021 11/01/2020  Glucose 70 - 99 mg/dL 269(H) 292(H) 121(H)  BUN 8 - 23 mg/dL 48(H) 69(H) 46(H)  Creatinine 0.44 - 1.00 mg/dL 9.62(H) 10.35(HH) 10.47(HH)  BUN/Creat Ratio 12 - 28 - 7(L) 4(L)  Sodium 135 - 145 mmol/L 135 138 140  Potassium 3.5 - 5.1 mmol/L 4.5 4.7 4.5  Chloride 98 - 111 mmol/L 93(L) 96 94(L)  CO2 22 - 32 mmol/L '27 21 24  '$ Calcium 8.9 - 10.3 mg/dL 9.3 9.4 9.6   Chest imaging: HRCT Chest 02/02/21 Mediastinum/Nodes: Mediastinal lymph nodes appear similar to 05/20/2018. No pathologically enlarged mediastinal or axillary lymph nodes. Hilar regions are difficult to evaluate without IV contrast but appear grossly unremarkable. Esophagus is grossly unremarkable.  Lungs/Pleura: Numerous millimetric basilar predominant  pulmonary nodules are unchanged. Negative for subpleural reticulation, traction bronchiectasis/bronchiolectasis, ground-glass, architectural distortion or honeycombing. No pleural fluid. Airway is unremarkable. There is air trapping.  CXR 09/15/20 Partially visualized surgical hardware from ACDF. Stable cardiomediastinal silhouette with mild cardiomegaly. No pneumothorax. No pleural effusion. Lungs appear clear, with no acute consolidative airspace disease and no pulmonary edema.  CT Chest WO Contrast 05/20/2018 Cardiovascular: Atherosclerotic calcifications are noted within the thoracic aorta and its branches. Coronary calcifications are noted. No aneurysmal dilatation is seen. Mild cardiac enlargement is noted. No pericardial fluid is seen.  Mediastinum/Nodes: Thoracic inlet is within normal limits. Scattered small mediastinal nodes are seen stable in appearance from the prior exam. No sizable lymphadenopathy is identified. No hilar adenopathy is noted. The esophagus as visualized is within normal limits.  Lungs/Pleura: Lungs are well aerated without focal infiltrate, effusion or pneumothorax. Scattered small pulmonary nodules are noted throughout both lungs and are stable from the prior CT of the  chest abdomen and pelvis from 2018. Review of a previous CT of the chest from 12/15/2015 also shows these nodules to be stable and consistent with a benign etiology. No further follow-up is Necessary.  PFT: PFT Results Latest Ref Rng & Units 12/13/2020 03/17/2015  FVC-Pre L 2.01 2.02  FVC-Predicted Pre % 90 85  FVC-Post L 2.00 2.03  FVC-Predicted Post % 90 85  Pre FEV1/FVC % % 81 84  Post FEV1/FCV % % 83 85  FEV1-Pre L 1.63 1.70  FEV1-Predicted Pre % 95 92  FEV1-Post L 1.65 1.74  DLCO uncorrected ml/min/mmHg 11.72 13.62  DLCO UNC% % 64 63  DLCO corrected ml/min/mmHg 11.72 -  DLCO COR %Predicted % 64 -  DLVA Predicted % 75 94  TLC L 3.88 -  TLC % Predicted % 81 -  RV %  Predicted % 88 -  PFT 2022: Mild diffusion defect  Echo: 2016 - Left ventricle: The cavity size was normal. Wall thickness was  increased in a pattern of mild LVH. Systolic function was normal.  The estimated ejection fraction was in the range of 60% to 65%.  Wall motion was normal; there were no regional wall motion  abnormalities. Doppler parameters are consistent with restrictive  physiology, indicative of decreased left ventricular diastolic  compliance and/or increased left atrial pressure.  - Mitral valve: There was mild regurgitation.  - Left atrium: The atrium was severely dilated.  - Right atrium: Central venous pressure (est): 3 mm Hg.  - Tricuspid valve: There was trivial regurgitation.  - Pulmonary arteries: Systolic pressure could not be accurately  estimated.  - Pericardium, extracardiac: A small pericardial effusion was  identified circumferential to the heart.   NM Stress Test 10/20/2020  The left ventricular ejection fraction is normal (55-65%).  Nuclear stress EF: 55%.  There was no ST segment deviation noted during stress.  No T wave inversion was noted during stress.  The study is normal.  This is a low risk study.  Sleep Study 12/2020 AHI 1.1/h. No significant central sleep apneas. Minimal nocturnal desaturations. Severe periodic limb movement of sleep occurred during the study. No significant associated arousals.   Assessment & Plan:   Moderate persistent asthma without complication - Plan: CBC with Differential, IgE, predniSONE (DELTASONE) 10 MG tablet, montelukast (SINGULAIR) 10 MG tablet, IgE, CBC with Differential  Decreased diffusion capacity of lung  Discussion: [name] is a 70 year old woman, former smoker with history of coronary artery disease, heart failure preserved EF, ESRD on HD, GERD, hypertension and diabetes mellitus type II who returns to pulmonary clinic for evaluation of shortness of breath.   She has a mild  diffusion defect on recent pulmonary function testing and signs of air trapping on HRCT chest concerning for asthma.   She has benefited from ICS/LABA/LAMA therapy via spiriva and symbicort. We will provide her with samples of breztri today. She is awaiting for prescription approval for breztri.   She is to start montelukast '10mg'$  daily for her asthma and allergies.   She appears to be having an exacerbation of her asthma at this time. We will start her on a steroid taper for 9 days starting at '30mg'$  of prednisone daily.   Her peripheral eosinophil count is 1,200 and her IgE level is 113kU/L. If she continues to have increased symptoms despite maximal inhaler therapy we will consider starting her on biologic therapy.   She is to follow up in 4 months.  Freda Jackson, MD [facility] Office: [phone]    Current Outpatient Medications:  .  acetaminophen (TYLENOL) 500 MG tablet, Take 1,000 mg by mouth every 6 (six) hours as needed for moderate pain or headache., Disp: , Rfl:  .  albuterol (PROVENTIL) (2.5 MG/3ML) 0.083% nebulizer solution, Take 3 mLs (2.5 mg total) by nebulization every 6 (six) hours as needed for wheezing or shortness of breath., Disp: 75 mL, Rfl: 12 .  albuterol (VENTOLIN HFA) 108 (90 Base) MCG/ACT inhaler, Inhale 2 puffs into the lungs every 6 (six) hours as needed for wheezing or shortness of breath., Disp: 1 each, Rfl: 2 .  aspirin 81 MG EC tablet, Take 81 mg by mouth daily., Disp: , Rfl:  .  atenolol (TENORMIN) 50 MG tablet, Take 1 tablet (50 mg total) by mouth 2 (two) times daily., Disp: 180 tablet, Rfl: 1 .  B Complex-C-Folic Acid (DIALYVITE Q000111Q) 0.8 MG TABS, Take 1 tablet by mouth daily., Disp: , Rfl:  .  Budeson-Glycopyrrol-Formoterol (BREZTRI AEROSPHERE) 160-9-4.8 MCG/ACT AERO, Inhale 2 puffs into the lungs in the morning and at bedtime., Disp: 32.1 g, Rfl: 11 .  Budeson-Glycopyrrol-Formoterol (BREZTRI AEROSPHERE) 160-9-4.8 MCG/ACT AERO,  Inhale 2 puffs into the lungs 2 (two) times daily., Disp: 5.9 g, Rfl: 0 .  busPIRone (BUSPAR) 7.5 MG tablet, Take 1 tablet (7.5 mg total) by mouth 3 (three) times daily as needed., Disp: 90 tablet, Rfl: 1 .  calcium acetate (PHOSLO) 667 MG capsule, Take 667-1,334 mg by mouth See admin instructions. Take 1334 mg with meals 3 times daily and 667 mg with snacks, Disp: , Rfl:  .  Carboxymethylcellul-Glycerin (LUBRICATING EYE DROPS OP), Place 1 drop into both eyes daily as needed (dry eyes)., Disp: , Rfl:  .  cetirizine (ZYRTEC) 10 MG tablet, Take 1 tablet (10 mg total) by mouth daily as needed for allergies., Disp: 90 tablet, Rfl: 3 .  DULoxetine (CYMBALTA) 20 MG capsule, Take 1 capsule (20 mg total) by mouth daily., Disp: 30 capsule, Rfl: 3 .  gabapentin (NEURONTIN) 400 MG capsule, Take 1 capsule (400 mg total) by mouth See admin instructions. Take it after HD, Disp: 30 capsule, Rfl: 1 .  glucose blood (ONETOUCH ULTRA) test strip, Use to test blood sugar 3 times daily as directed. DX: E11.9, Disp: 300 each, Rfl: 12 .  Insulin Glargine (BASAGLAR KWIKPEN) 100 UNIT/ML, Inject 20 Units into the skin at bedtime. Add five units each time the fasting is over 125 three days in a row. (Patient taking differently: Inject 30 Units into the skin at bedtime. Add five units each time the fasting is over 125 three days in a row.), Disp: 30 mL, Rfl: 1 .  isosorbide mononitrate (IMDUR) 30 MG 24 hr tablet, Take 0.5 tablets (15 mg total) by mouth daily., Disp: 30 tablet, Rfl: 1 .  meclizine (ANTIVERT) 25 MG tablet, Take 25 mg by mouth 3 (three) times daily., Disp: , Rfl:  .  montelukast (SINGULAIR) 10 MG tablet, Take 1 tablet (10 mg total) by mouth at bedtime., Disp: 30 tablet, Rfl: 11 .  NIFEdipine (PROCARDIA XL/NIFEDICAL XL) 60 MG 24 hr tablet, Take 1 tablet (60 mg total) by mouth daily. (ON NON-DIALYSIS DAYS), Disp: 30 tablet, Rfl: 6 .  nitroGLYCERIN (NITROSTAT) 0.4 MG SL tablet, Place 1 tablet (0.4 mg total) under the  tongue every 5 (five) minutes as needed for chest pain., Disp: 25 tablet, Rfl: 11 .  ondansetron (ZOFRAN) 4 MG tablet, TAKE 1 TABLET BY MOUTH EVERY 8 HOURS AS NEEDED FOR  NAUSEA AND VOMITING (Patient taking differently: Take 4 mg by mouth every 8 (eight) hours as needed.), Disp: 20 tablet, Rfl: 0 .  polyethylene glycol powder (GLYCOLAX/MIRALAX) 17 GM/SCOOP powder, Take 17 g by mouth 2 (two) times daily as needed for moderate constipation. For constipation, Disp: 3350 g, Rfl: 5 .  predniSONE (DELTASONE) 10 MG tablet, Take 1 tablet (10 mg total) by mouth daily with breakfast., Disp: 20 tablet, Rfl: 0 .  QUEtiapine (SEROQUEL) 50 MG tablet, Take 1 tablet (50 mg total) by mouth at bedtime. For sleep and for anxiety, Disp: 30 tablet, Rfl: 2 .  rosuvastatin (CRESTOR) 5 MG tablet, Take 1 tablet (5 mg total) by mouth daily. For cholesterol, Disp: 90 tablet, Rfl: 3 .  Tiotropium Bromide Monohydrate (SPIRIVA RESPIMAT) 1.25 MCG/ACT AERS, Inhale 2 puffs into the lungs daily., Disp: 8 g, Rfl: 0  Current Facility-Administered Medications:  .  0.9 %  sodium chloride infusion, 250 mL, Intravenous, PRN, Angelia Mould, MD .  sodium chloride flush (NS) 0.9 % injection 3 mL, 3 mL, Intravenous, Q12H, Angelia Mould, MD .  sodium chloride flush (NS) 0.9 % injection 3 mL, 3 mL, Intravenous, PRN, Angelia Mould, MD

## 2021-04-12 ENCOUNTER — Other Ambulatory Visit: Payer: Self-pay | Admitting: Family Medicine

## 2021-04-12 DIAGNOSIS — F411 Generalized anxiety disorder: Secondary | ICD-10-CM

## 2021-04-12 LAB — IGE: IgE (Immunoglobulin E), Serum: 113 kU/L (ref <=114)

## 2021-04-13 ENCOUNTER — Ambulatory Visit (HOSPITAL_COMMUNITY)
Admission: RE | Admit: 2021-04-13 | Discharge: 2021-04-13 | Disposition: A | Discharge status: Home / Self Care | Payer: Medicare Other | Source: Ambulatory Visit | Attending: Vascular Surgery | Admitting: Vascular Surgery

## 2021-04-13 ENCOUNTER — Other Ambulatory Visit: Payer: Self-pay

## 2021-04-13 ENCOUNTER — Ambulatory Visit (INDEPENDENT_AMBULATORY_CARE_PROVIDER_SITE_OTHER): Payer: Medicare Other | Admitting: Vascular Surgery

## 2021-04-13 ENCOUNTER — Telehealth: Payer: Self-pay | Admitting: *Deleted

## 2021-04-13 ENCOUNTER — Encounter: Payer: Self-pay | Admitting: Vascular Surgery

## 2021-04-13 ENCOUNTER — Encounter (HOSPITAL_COMMUNITY): Payer: Medicare Other

## 2021-04-13 VITALS — BP 149/75 | HR 86 | Temp 98.0°F | Resp 20 | Ht 63.0 in | Wt 193.0 lb

## 2021-04-13 DIAGNOSIS — I739 Peripheral vascular disease, unspecified: Secondary | ICD-10-CM

## 2021-04-13 DIAGNOSIS — I200 Unstable angina: Secondary | ICD-10-CM

## 2021-04-13 DIAGNOSIS — N186 End stage renal disease: Secondary | ICD-10-CM | POA: Diagnosis not present

## 2021-04-13 DIAGNOSIS — Z992 Dependence on renal dialysis: Secondary | ICD-10-CM

## 2021-04-13 MED ORDER — SODIUM CHLORIDE 0.9 % IV SOLN: 250.0000 mL | INTRAVENOUS | Status: DC | PRN

## 2021-04-13 MED ORDER — SODIUM CHLORIDE 0.9% FLUSH: 3.0000 mL | Freq: Two times a day (BID) | INTRAVENOUS | Status: DC

## 2021-04-13 MED ORDER — SODIUM CHLORIDE 0.9% FLUSH: 3.0000 mL | INTRAVENOUS | Status: DC | PRN

## 2021-04-13 NOTE — H&P (View-Only) (Signed)
REASON FOR VISIT:   Follow-up of peripheral vascular disease  MEDICAL ISSUES:   PERIPHERAL VASCULAR DISEASE: This patient has known multilevel arterial occlusive disease.  I think some of her symptoms in her legs could be attributed to her peripheral vascular disease however given that she has symptoms at rest I think her degenerative disc disease is also contributing significantly to her leg pain.  Based on her previous CT scan she really does not have any great options either from an endovascular standpoint or from an open standpoint given that she has small calcified arteries.  Fortunately her symptoms are stable.  She is not a smoker.  I encouraged her to stay as active as possible.  We will check follow-up ABIs in 1 year.  END-STAGE RENAL DISEASE: She has a pulsatile upper arm fistula which is having problems with bleeding.  For this reason I recommend that we proceed with a fistulogram.  She dialyzes on Tuesdays Thursdays and Saturdays and I have scheduled her fistulogram for Friday, 04/15/2021.  If we see something amenable to angioplasty this could be performed at the same time.  I will make further recommendations pending these results.   HPI:   [name] is a pleasant 70 y.o. female who I been following with peripheral vascular disease.  Of note she also has end-stage renal disease.  I last saw her on 10/06/2020.  She has diffuse calcific disease with multilevel disease.  She was not a good candidate for either endovascular procedure or open surgery given her diffuse calcific disease with small arteries.  Fortunately when I saw her last in November of last year her symptoms were tolerable.  I encouraged her to stay as active as possible.  I ordered follow-up ABIs in 6 months.  Fortunately she was not a smoker.  The patient continues to have pain in both legs which occurs both with standing and with ambulation.  She does have a history of degenerative disc disease of her back and  has had 3 previous operations on her back.  She also gets pain with ambulation.  I do not get any history of rest pain or nonhealing ulcers.  Her main complaint today is that she has been having increasing problems with bleeding from her right AV fistula which was done many years ago.  She has had multiple procedures on this.  She dialyzes on Tuesdays Thursdays and Saturdays.  Risk factors for peripheral vascular disease include diabetes, hypertension, hypercholesterolemia, and remote history of tobacco use.  She is on aspirin and is on a statin.  Past Medical History:  Diagnosis Date  . Acute on chronic diastolic CHF (congestive heart failure) ([place]) 03/05/2015   Grade 2. EF 60-65%  . Anemia    hx of  . Anxiety   . Arthritis   . CAD (coronary artery disease)    s/p Stenting in 2005;  [place] 7/09: Vigorous LV function, 2-3+ MR, RI 40%, RI stent patent, proximal-mid RCA 40-50%;  Echo 7/09:  EF 55-65%, trivial MR;  Myoview 11/18/12: EF 66%, normal LV wall motion, mild to moderate anterior ischemia - reviewed by [facility] and felt to rep breast atten (low risk);  Echo 6/14: mild LVH, EF 55-65%, Gr 2 DD, PASP 44, trivial eff   . Chronic kidney disease    CREATININE IS UP--GOING TO KIDNEY MD   . Chronic neck pain   . COPD (chronic obstructive pulmonary disease) ([place])   . Degenerative joint disease    left  shoulder  . Diabetic neuropathy ([place])   . Diverticulosis   . Esophageal stricture   . GERD (gastroesophageal reflux disease)   . Hyperlipidemia   . Hypertension   . IDDM (insulin dependent diabetes mellitus)   . Pericardial effusion 03/06/2015   Very small per echo ; pleural nodules seen on CT chest.  . Peripheral vascular disease ([place])   . Stroke [facility])    "mini- years ago"  . Vitamin D deficiency     Family History  Problem Relation Age of Onset  . Heart disease Other   . Hypertension Father   . Cancer Father   . Hypertension Mother   . Hypertension Daughter   . Pancreatitis Brother   .  Hypertension Daughter   . Diabetes Daughter     SOCIAL HISTORY: Social History   Tobacco Use  . Smoking status: Former Smoker    Packs/day: 1.00    Years: 30.00    Pack years: 30.00    Quit date: 12/04/2001    Years since quitting: 19.3  . Smokeless tobacco: Never Used  Substance Use Topics  . Alcohol use: No    Alcohol/week: 6.0 standard drinks    Types: 6 Cans of beer per week    Allergies  Allergen Reactions  . Azithromycin     Prolonged QT  . Ace Inhibitors Cough  . Clopidogrel Bisulfate Other (See Comments)    Sick, Headache, "Felt terrible"  . Codeine Other (See Comments)    hallucinations  . Lisinopril Cough  . Penicillins Other (See Comments)    Unknown reaction Did it involve swelling of the face/tongue/throat, SOB, or low BP? Unknown Did it involve sudden or severe rash/hives, skin peeling, or any reaction on the inside of your mouth or nose? Unknown Did you need to seek medical attention at a hospital or doctor's office? Unknown When did it last happen?childhood allergy If all above answers are "NO", may proceed with cephalosporin use.      Current Outpatient Medications  Medication Sig Dispense Refill  . acetaminophen (TYLENOL) 500 MG tablet Take 1,000 mg by mouth every 6 (six) hours as needed for moderate pain or headache.    . albuterol (PROVENTIL) (2.5 MG/3ML) 0.083% nebulizer solution Take 3 mLs (2.5 mg total) by nebulization every 6 (six) hours as needed for wheezing or shortness of breath. 75 mL 12  . albuterol (VENTOLIN HFA) 108 (90 Base) MCG/ACT inhaler Inhale 2 puffs into the lungs every 6 (six) hours as needed for wheezing or shortness of breath. 1 each 2  . aspirin 81 MG EC tablet Take 81 mg by mouth daily.    [name] atenolol (TENORMIN) 50 MG tablet Take 1 tablet (50 mg total) by mouth 2 (two) times daily. 180 tablet 1  . B Complex-C-Folic Acid (DIALYVITE Q000111Q) 0.8 MG TABS Take 1 tablet by mouth daily.    . Budeson-Glycopyrrol-Formoterol (BREZTRI  AEROSPHERE) 160-9-4.8 MCG/ACT AERO Inhale 2 puffs into the lungs in the morning and at bedtime. 32.1 g 11  . Budeson-Glycopyrrol-Formoterol (BREZTRI AEROSPHERE) 160-9-4.8 MCG/ACT AERO Inhale 2 puffs into the lungs 2 (two) times daily. 5.9 g 0  . busPIRone (BUSPAR) 7.5 MG tablet Take 1 tablet (7.5 mg total) by mouth 3 (three) times daily as needed. 90 tablet 1  . calcium acetate (PHOSLO) 667 MG capsule Take 667-1,334 mg by mouth See admin instructions. Take 1334 mg with meals 3 times daily and 667 mg with snacks    . Carboxymethylcellul-Glycerin (LUBRICATING EYE DROPS OP) Place 1  drop into both eyes daily as needed (dry eyes).    . cetirizine (ZYRTEC) 10 MG tablet Take 1 tablet (10 mg total) by mouth daily as needed for allergies. 90 tablet 3  . DULoxetine (CYMBALTA) 20 MG capsule Take 1 capsule (20 mg total) by mouth daily. 30 capsule 3  . gabapentin (NEURONTIN) 400 MG capsule Take 1 capsule (400 mg total) by mouth See admin instructions. Take it after HD 30 capsule 1  . glucose blood (ONETOUCH ULTRA) test strip Use to test blood sugar 3 times daily as directed. DX: E11.9 300 each 12  . Insulin Glargine (BASAGLAR KWIKPEN) 100 UNIT/ML Inject 20 Units into the skin at bedtime. Add five units each time the fasting is over 125 three days in a row. (Patient taking differently: Inject 30 Units into the skin at bedtime. Add five units each time the fasting is over 125 three days in a row.) 30 mL 1  . isosorbide mononitrate (IMDUR) 30 MG 24 hr tablet Take 0.5 tablets (15 mg total) by mouth daily. 30 tablet 1  . meclizine (ANTIVERT) 25 MG tablet Take 25 mg by mouth 3 (three) times daily.    . montelukast (SINGULAIR) 10 MG tablet Take 1 tablet (10 mg total) by mouth at bedtime. 30 tablet 11  . NIFEdipine (PROCARDIA XL/NIFEDICAL XL) 60 MG 24 hr tablet Take 1 tablet (60 mg total) by mouth daily. (ON NON-DIALYSIS DAYS) 30 tablet 6  . nitroGLYCERIN (NITROSTAT) 0.4 MG SL tablet Place 1 tablet (0.4 mg total) under the  tongue every 5 (five) minutes as needed for chest pain. 25 tablet 11  . ondansetron (ZOFRAN) 4 MG tablet TAKE 1 TABLET BY MOUTH EVERY 8 HOURS AS NEEDED FOR NAUSEA AND VOMITING (Patient taking differently: Take 4 mg by mouth every 8 (eight) hours as needed.) 20 tablet 0  . polyethylene glycol powder (GLYCOLAX/MIRALAX) 17 GM/SCOOP powder Take 17 g by mouth 2 (two) times daily as needed for moderate constipation. For constipation 3350 g 5  . predniSONE (DELTASONE) 10 MG tablet Take 1 tablet (10 mg total) by mouth daily with breakfast. 20 tablet 0  . QUEtiapine (SEROQUEL) 50 MG tablet Take 1 tablet (50 mg total) by mouth at bedtime. For sleep and for anxiety 30 tablet 2  . rosuvastatin (CRESTOR) 5 MG tablet Take 1 tablet (5 mg total) by mouth daily. For cholesterol 90 tablet 3  . Tiotropium Bromide Monohydrate (SPIRIVA RESPIMAT) 1.25 MCG/ACT AERS Inhale 2 puffs into the lungs daily. 8 g 0   No current facility-administered medications for this visit.    REVIEW OF SYSTEMS:  '[X]'$  denotes positive finding, '[ ]'$  denotes negative finding Cardiac  Comments:  Chest pain or chest pressure:    Shortness of breath upon exertion:    Short of breath when lying flat:    Irregular heart rhythm:        Vascular    Pain in calf, thigh, or hip brought on by ambulation:    Pain in feet at night that wakes you up from your sleep:     Blood clot in your veins:    Leg swelling:         Pulmonary    Oxygen at home:    Productive cough:     Wheezing:         Neurologic    Sudden weakness in arms or legs:     Sudden numbness in arms or legs:     Sudden onset of difficulty speaking or  slurred speech:    Temporary loss of vision in one eye:     Problems with dizziness:         Gastrointestinal    Blood in stool:     Vomited blood:         Genitourinary    Burning when urinating:     Blood in urine:        Psychiatric    Major depression:         Hematologic    Bleeding problems:    Problems with  blood clotting too easily:        Skin    Rashes or ulcers:        Constitutional    Fever or chills:     PHYSICAL EXAM:   Vitals:   04/13/21 1353  BP: (!) 149/75  Pulse: 86  Resp: 20  Temp: 98 F (36.7 C)  SpO2: 96%  Weight: 193 lb (87.5 kg)  Height: '5\' 3"'$  (1.6 m)    GENERAL: The patient is a well-nourished female, in no acute distress. The vital signs are documented above. CARDIAC: There is a regular rate and rhythm.  VASCULAR: I do not detect carotid bruits. She has diminished femoral pulses bilaterally.  I cannot palpate pedal pulses however both feet are warm and well-perfused. She has a fistula in her right upper arm which is pulsatile with an aneurysmal segment centrally. PULMONARY: There is good air exchange bilaterally without wheezing or rales. ABDOMEN: Soft and non-tender with normal pitched bowel sounds.  MUSCULOSKELETAL: There are no major deformities or cyanosis. NEUROLOGIC: No focal weakness or paresthesias are detected. SKIN: There are no ulcers or rashes noted. PSYCHIATRIC: The patient has a normal affect.  DATA:    ARTERIAL DOPPLER STUDY: I have independently interpreted her arterial Doppler study.  On the right side there is a monophasic dorsalis pedis signal with a biphasic posterior tibial signal.  ABIs 99%.  Toe pressure 76 mmHg.  On the left side there is a monophasic dorsalis pedis and posterior tibial signal.  ABIs 69%.  Toe pressures 45 mmHg.  Deitra Mayo [facility] [phone]

## 2021-04-13 NOTE — Progress Notes (Signed)
REASON FOR VISIT:   Follow-up of peripheral vascular disease  MEDICAL ISSUES:   PERIPHERAL VASCULAR DISEASE: This patient has known multilevel arterial occlusive disease.  I think some of her symptoms in her legs could be attributed to her peripheral vascular disease however given that she has symptoms at rest I think her degenerative disc disease is also contributing significantly to her leg pain.  Based on her previous CT scan she really does not have any great options either from an endovascular standpoint or from an open standpoint given that she has small calcified arteries.  Fortunately her symptoms are stable.  She is not a smoker.  I encouraged her to stay as active as possible.  We will check follow-up ABIs in 1 year.  END-STAGE RENAL DISEASE: She has a pulsatile upper arm fistula which is having problems with bleeding.  For this reason I recommend that we proceed with a fistulogram.  She dialyzes on Tuesdays Thursdays and Saturdays and I have scheduled her fistulogram for Friday, 04/15/2021.  If we see something amenable to angioplasty this could be performed at the same time.  I will make further recommendations pending these results.   HPI:   [name] is a pleasant 70 y.o. female who I been following with peripheral vascular disease.  Of note she also has end-stage renal disease.  I last saw her on 10/06/2020.  She has diffuse calcific disease with multilevel disease.  She was not a good candidate for either endovascular procedure or open surgery given her diffuse calcific disease with small arteries.  Fortunately when I saw her last in November of last year her symptoms were tolerable.  I encouraged her to stay as active as possible.  I ordered follow-up ABIs in 6 months.  Fortunately she was not a smoker.  The patient continues to have pain in both legs which occurs both with standing and with ambulation.  She does have a history of degenerative disc disease of her back and  has had 3 previous operations on her back.  She also gets pain with ambulation.  I do not get any history of rest pain or nonhealing ulcers.  Her main complaint today is that she has been having increasing problems with bleeding from her right AV fistula which was done many years ago.  She has had multiple procedures on this.  She dialyzes on Tuesdays Thursdays and Saturdays.  Risk factors for peripheral vascular disease include diabetes, hypertension, hypercholesterolemia, and remote history of tobacco use.  She is on aspirin and is on a statin.  Past Medical History:  Diagnosis Date  . Acute on chronic diastolic CHF (congestive heart failure) ([place]) 03/05/2015   Grade 2. EF 60-65%  . Anemia    hx of  . Anxiety   . Arthritis   . CAD (coronary artery disease)    s/p Stenting in 2005;  [place] 7/09: Vigorous LV function, 2-3+ MR, RI 40%, RI stent patent, proximal-mid RCA 40-50%;  E[place] 7/09:  EF 55-65%, trivial MR;  Myoview 11/18/12: EF 66%, normal LV wall motion, mild to moderate anterior ischemia - reviewed by [facility] and felt to rep breast atten (low risk);  Echo 6/14: mild LVH, EF 55-65%, Gr 2 DD, PASP 44, trivial eff   . Chronic kidney disease    CREATININE IS UP--GOING TO KIDNEY MD   . Chronic neck pain   . COPD (chronic obstructive pulmonary disease) ([place])   . Degenerative joint disease    left  shoulder  . Diabetic neuropathy ([place])   . Diverticulosis   . Esophageal stricture   . GERD (gastroesophageal reflux disease)   . Hyperlipidemia   . Hypertension   . IDDM (insulin dependent diabetes mellitus)   . Pericardial effusion 03/06/2015   Very small per echo ; pleural nodules seen on CT chest.  . Peripheral vascular disease ([place])   . Stroke [facility])    "mini- years ago"  . Vitamin D deficiency     Family History  Problem Relation Age of Onset  . Heart disease Other   . Hypertension Father   . Cancer Father   . Hypertension Mother   . Hypertension Daughter   . Pancreatitis Brother   .  Hypertension Daughter   . Diabetes Daughter     SOCIAL HISTORY: Social History   Tobacco Use  . Smoking status: Former Smoker    Packs/day: 1.00    Years: 30.00    Pack years: 30.00    Quit date: 12/04/2001    Years since quitting: 19.3  . Smokeless tobacco: Never Used  Substance Use Topics  . Alcohol use: No    Alcohol/week: 6.0 standard drinks    Types: 6 Cans of beer per week    Allergies  Allergen Reactions  . Azithromycin     Prolonged QT  . Ace Inhibitors Cough  . Clopidogrel Bisulfate Other (See Comments)    Sick, Headache, "Felt terrible"  . Codeine Other (See Comments)    hallucinations  . Lisinopril Cough  . Penicillins Other (See Comments)    Unknown reaction Did it involve swelling of the face/tongue/throat, SOB, or low BP? Unknown Did it involve sudden or severe rash/hives, skin peeling, or any reaction on the inside of your mouth or nose? Unknown Did you need to seek medical attention at a hospital or doctor's office? Unknown When did it last happen?childhood allergy If all above answers are "NO", may proceed with cephalosporin use.      Current Outpatient Medications  Medication Sig Dispense Refill  . acetaminophen (TYLENOL) 500 MG tablet Take 1,000 mg by mouth every 6 (six) hours as needed for moderate pain or headache.    . albuterol (PROVENTIL) (2.5 MG/3ML) 0.083% nebulizer solution Take 3 mLs (2.5 mg total) by nebulization every 6 (six) hours as needed for wheezing or shortness of breath. 75 mL 12  . albuterol (VENTOLIN HFA) 108 (90 Base) MCG/ACT inhaler Inhale 2 puffs into the lungs every 6 (six) hours as needed for wheezing or shortness of breath. 1 each 2  . aspirin 81 MG EC tablet Take 81 mg by mouth daily.    [name] atenolol (TENORMIN) 50 MG tablet Take 1 tablet (50 mg total) by mouth 2 (two) times daily. 180 tablet 1  . B Complex-C-Folic Acid (DIALYVITE Q000111Q) 0.8 MG TABS Take 1 tablet by mouth daily.    . Budeson-Glycopyrrol-Formoterol (BREZTRI  AEROSPHERE) 160-9-4.8 MCG/ACT AERO Inhale 2 puffs into the lungs in the morning and at bedtime. 32.1 g 11  . Budeson-Glycopyrrol-Formoterol (BREZTRI AEROSPHERE) 160-9-4.8 MCG/ACT AERO Inhale 2 puffs into the lungs 2 (two) times daily. 5.9 g 0  . busPIRone (BUSPAR) 7.5 MG tablet Take 1 tablet (7.5 mg total) by mouth 3 (three) times daily as needed. 90 tablet 1  . calcium acetate (PHOSLO) 667 MG capsule Take 667-1,334 mg by mouth See admin instructions. Take 1334 mg with meals 3 times daily and 667 mg with snacks    . Carboxymethylcellul-Glycerin (LUBRICATING EYE DROPS OP) Place 1  drop into both eyes daily as needed (dry eyes).    . cetirizine (ZYRTEC) 10 MG tablet Take 1 tablet (10 mg total) by mouth daily as needed for allergies. 90 tablet 3  . DULoxetine (CYMBALTA) 20 MG capsule Take 1 capsule (20 mg total) by mouth daily. 30 capsule 3  . gabapentin (NEURONTIN) 400 MG capsule Take 1 capsule (400 mg total) by mouth See admin instructions. Take it after HD 30 capsule 1  . glucose blood (ONETOUCH ULTRA) test strip Use to test blood sugar 3 times daily as directed. DX: E11.9 300 each 12  . Insulin Glargine (BASAGLAR KWIKPEN) 100 UNIT/ML Inject 20 Units into the skin at bedtime. Add five units each time the fasting is over 125 three days in a row. (Patient taking differently: Inject 30 Units into the skin at bedtime. Add five units each time the fasting is over 125 three days in a row.) 30 mL 1  . isosorbide mononitrate (IMDUR) 30 MG 24 hr tablet Take 0.5 tablets (15 mg total) by mouth daily. 30 tablet 1  . meclizine (ANTIVERT) 25 MG tablet Take 25 mg by mouth 3 (three) times daily.    . montelukast (SINGULAIR) 10 MG tablet Take 1 tablet (10 mg total) by mouth at bedtime. 30 tablet 11  . NIFEdipine (PROCARDIA XL/NIFEDICAL XL) 60 MG 24 hr tablet Take 1 tablet (60 mg total) by mouth daily. (ON NON-DIALYSIS DAYS) 30 tablet 6  . nitroGLYCERIN (NITROSTAT) 0.4 MG SL tablet Place 1 tablet (0.4 mg total) under the  tongue every 5 (five) minutes as needed for chest pain. 25 tablet 11  . ondansetron (ZOFRAN) 4 MG tablet TAKE 1 TABLET BY MOUTH EVERY 8 HOURS AS NEEDED FOR NAUSEA AND VOMITING (Patient taking differently: Take 4 mg by mouth every 8 (eight) hours as needed.) 20 tablet 0  . polyethylene glycol powder (GLYCOLAX/MIRALAX) 17 GM/SCOOP powder Take 17 g by mouth 2 (two) times daily as needed for moderate constipation. For constipation 3350 g 5  . predniSONE (DELTASONE) 10 MG tablet Take 1 tablet (10 mg total) by mouth daily with breakfast. 20 tablet 0  . QUEtiapine (SEROQUEL) 50 MG tablet Take 1 tablet (50 mg total) by mouth at bedtime. For sleep and for anxiety 30 tablet 2  . rosuvastatin (CRESTOR) 5 MG tablet Take 1 tablet (5 mg total) by mouth daily. For cholesterol 90 tablet 3  . Tiotropium Bromide Monohydrate (SPIRIVA RESPIMAT) 1.25 MCG/ACT AERS Inhale 2 puffs into the lungs daily. 8 g 0   No current facility-administered medications for this visit.    REVIEW OF SYSTEMS:  '[X]'$  denotes positive finding, '[ ]'$  denotes negative finding Cardiac  Comments:  Chest pain or chest pressure:    Shortness of breath upon exertion:    Short of breath when lying flat:    Irregular heart rhythm:        Vascular    Pain in calf, thigh, or hip brought on by ambulation:    Pain in feet at night that wakes you up from your sleep:     Blood clot in your veins:    Leg swelling:         Pulmonary    Oxygen at home:    Productive cough:     Wheezing:         Neurologic    Sudden weakness in arms or legs:     Sudden numbness in arms or legs:     Sudden onset of difficulty speaking or  slurred speech:    Temporary loss of vision in one eye:     Problems with dizziness:         Gastrointestinal    Blood in stool:     Vomited blood:         Genitourinary    Burning when urinating:     Blood in urine:        Psychiatric    Major depression:         Hematologic    Bleeding problems:    Problems with  blood clotting too easily:        Skin    Rashes or ulcers:        Constitutional    Fever or chills:     PHYSICAL EXAM:   Vitals:   04/13/21 1353  BP: (!) 149/75  Pulse: 86  Resp: 20  Temp: 98 F (36.7 C)  SpO2: 96%  Weight: 193 lb (87.5 kg)  Height: '5\' 3"'$  (1.6 m)    GENERAL: The patient is a well-nourished female, in no acute distress. The vital signs are documented above. CARDIAC: There is a regular rate and rhythm.  VASCULAR: I do not detect carotid bruits. She has diminished femoral pulses bilaterally.  I cannot palpate pedal pulses however both feet are warm and well-perfused. She has a fistula in her right upper arm which is pulsatile with an aneurysmal segment centrally. PULMONARY: There is good air exchange bilaterally without wheezing or rales. ABDOMEN: Soft and non-tender with normal pitched bowel sounds.  MUSCULOSKELETAL: There are no major deformities or cyanosis. NEUROLOGIC: No focal weakness or paresthesias are detected. SKIN: There are no ulcers or rashes noted. PSYCHIATRIC: The patient has a normal affect.  DATA:    ARTERIAL DOPPLER STUDY: I have independently interpreted her arterial Doppler study.  On the right side there is a monophasic dorsalis pedis signal with a biphasic posterior tibial signal.  ABIs 99%.  Toe pressure 76 mmHg.  On the left side there is a monophasic dorsalis pedis and posterior tibial signal.  ABIs 69%.  Toe pressures 45 mmHg.  Deitra Mayo [facility] [phone]

## 2021-04-13 NOTE — Telephone Encounter (Signed)
[facility]- pt assistance Basaglar #3  Pt aware - in fridge - can come pick up

## 2021-04-14 ENCOUNTER — Other Ambulatory Visit (HOSPITAL_COMMUNITY)
Admission: RE | Admit: 2021-04-14 | Discharge: 2021-04-14 | Disposition: A | Discharge status: Home / Self Care | Payer: Medicare Other | Source: Ambulatory Visit | Attending: Vascular Surgery | Admitting: Vascular Surgery

## 2021-04-14 DIAGNOSIS — Z01812 Encounter for preprocedural laboratory examination: Secondary | ICD-10-CM | POA: Insufficient documentation

## 2021-04-14 DIAGNOSIS — Z20822 Contact with and (suspected) exposure to covid-19: Secondary | ICD-10-CM | POA: Insufficient documentation

## 2021-04-15 ENCOUNTER — Encounter (HOSPITAL_COMMUNITY)
Admission: RE | Disposition: A | Discharge status: Home / Self Care | Payer: Self-pay | Source: Home / Self Care | Attending: Vascular Surgery

## 2021-04-15 ENCOUNTER — Encounter (HOSPITAL_COMMUNITY): Payer: Self-pay | Admitting: Vascular Surgery

## 2021-04-15 ENCOUNTER — Ambulatory Visit (HOSPITAL_COMMUNITY)
Admission: RE | Admit: 2021-04-15 | Discharge: 2021-04-15 | Disposition: A | Discharge status: Home / Self Care | Payer: Medicare Other | Attending: Vascular Surgery | Admitting: Vascular Surgery

## 2021-04-15 ENCOUNTER — Other Ambulatory Visit: Payer: Self-pay

## 2021-04-15 DIAGNOSIS — N186 End stage renal disease: Secondary | ICD-10-CM | POA: Insufficient documentation

## 2021-04-15 DIAGNOSIS — Z992 Dependence on renal dialysis: Secondary | ICD-10-CM

## 2021-04-15 DIAGNOSIS — E7800 Pure hypercholesterolemia, unspecified: Secondary | ICD-10-CM | POA: Diagnosis not present

## 2021-04-15 DIAGNOSIS — I120 Hypertensive chronic kidney disease with stage 5 chronic kidney disease or end stage renal disease: Secondary | ICD-10-CM | POA: Insufficient documentation

## 2021-04-15 DIAGNOSIS — E1151 Type 2 diabetes mellitus with diabetic peripheral angiopathy without gangrene: Secondary | ICD-10-CM | POA: Diagnosis not present

## 2021-04-15 DIAGNOSIS — Z885 Allergy status to narcotic agent status: Secondary | ICD-10-CM | POA: Insufficient documentation

## 2021-04-15 DIAGNOSIS — Z880 Allergy status to penicillin: Secondary | ICD-10-CM | POA: Diagnosis not present

## 2021-04-15 DIAGNOSIS — Z794 Long term (current) use of insulin: Secondary | ICD-10-CM | POA: Diagnosis not present

## 2021-04-15 DIAGNOSIS — Y841 Kidney dialysis as the cause of abnormal reaction of the patient, or of later complication, without mention of misadventure at the time of the procedure: Secondary | ICD-10-CM | POA: Insufficient documentation

## 2021-04-15 DIAGNOSIS — Z87891 Personal history of nicotine dependence: Secondary | ICD-10-CM | POA: Insufficient documentation

## 2021-04-15 DIAGNOSIS — Z7982 Long term (current) use of aspirin: Secondary | ICD-10-CM | POA: Insufficient documentation

## 2021-04-15 DIAGNOSIS — Z79899 Other long term (current) drug therapy: Secondary | ICD-10-CM | POA: Diagnosis not present

## 2021-04-15 DIAGNOSIS — Z888 Allergy status to other drugs, medicaments and biological substances status: Secondary | ICD-10-CM | POA: Diagnosis not present

## 2021-04-15 DIAGNOSIS — T82858A Stenosis of vascular prosthetic devices, implants and grafts, initial encounter: Secondary | ICD-10-CM | POA: Insufficient documentation

## 2021-04-15 DIAGNOSIS — E1122 Type 2 diabetes mellitus with diabetic chronic kidney disease: Secondary | ICD-10-CM | POA: Diagnosis not present

## 2021-04-15 DIAGNOSIS — T82898A Other specified complication of vascular prosthetic devices, implants and grafts, initial encounter: Secondary | ICD-10-CM

## 2021-04-15 HISTORY — PX: PERIPHERAL VASCULAR BALLOON ANGIOPLASTY: CATH118281

## 2021-04-15 LAB — POCT I-STAT, CHEM 8
BUN: 46 mg/dL — ABNORMAL HIGH (ref 8–23)
Calcium, Ion: 1.09 mmol/L — ABNORMAL LOW (ref 1.15–1.40)
Chloride: 97 mmol/L — ABNORMAL LOW (ref 98–111)
Creatinine, Ser: 8.6 mg/dL — ABNORMAL HIGH (ref 0.44–1.00)
Glucose, Bld: 291 mg/dL — ABNORMAL HIGH (ref 70–99)
HCT: 30 % — ABNORMAL LOW (ref 36.0–46.0)
Hemoglobin: 10.2 g/dL — ABNORMAL LOW (ref 12.0–15.0)
Potassium: 3.8 mmol/L (ref 3.5–5.1)
Sodium: 137 mmol/L (ref 135–145)
TCO2: 28 mmol/L (ref 22–32)

## 2021-04-15 LAB — SARS CORONAVIRUS 2 (TAT 6-24 HRS): SARS Coronavirus 2: NEGATIVE

## 2021-04-15 SURGERY — PERIPHERAL VASCULAR BALLOON ANGIOPLASTY
Anesthesia: LOCAL | Laterality: Right

## 2021-04-15 MED ORDER — LIDOCAINE HCL (PF) 1 % IJ SOLN
INTRAMUSCULAR | Status: AC
  Filled 2021-04-15: qty 30

## 2021-04-15 MED ORDER — HEPARIN SODIUM (PORCINE) 1000 UNIT/ML IJ SOLN
INTRAMUSCULAR | Status: DC | PRN
  Administered 2021-04-15: 2000 [IU] via INTRAVENOUS

## 2021-04-15 MED ORDER — HEPARIN (PORCINE) IN NACL 1000-0.9 UT/500ML-% IV SOLN
INTRAVENOUS | Status: DC | PRN
  Administered 2021-04-15: 500 mL

## 2021-04-15 MED ORDER — HEPARIN SODIUM (PORCINE) 1000 UNIT/ML IJ SOLN
INTRAMUSCULAR | Status: AC
  Filled 2021-04-15: qty 1

## 2021-04-15 MED ORDER — IODIXANOL 320 MG/ML IV SOLN
INTRAVENOUS | Status: DC | PRN
  Administered 2021-04-15: 40 mL via INTRAVENOUS

## 2021-04-15 MED ORDER — HEPARIN (PORCINE) IN NACL 1000-0.9 UT/500ML-% IV SOLN
INTRAVENOUS | Status: AC
  Filled 2021-04-15: qty 500

## 2021-04-15 MED ORDER — LIDOCAINE HCL (PF) 1 % IJ SOLN
INTRAMUSCULAR | Status: DC | PRN
  Administered 2021-04-15: 2 mL via INTRADERMAL

## 2021-04-15 SURGICAL SUPPLY — 17 items
BALLN LUTONIX AV 8X60X75 (BALLOONS) ×3
BALLN MUSTANG 6.0X40 75 (BALLOONS) ×3
BALLN MUSTANG 8.0X40 75 (BALLOONS) ×3
BALLOON LUTONIX AV 8X60X75 (BALLOONS) ×1 IMPLANT
BALLOON MUSTANG 6.0X40 75 (BALLOONS) ×1 IMPLANT
BALLOON MUSTANG 8.0X40 75 (BALLOONS) ×1 IMPLANT
COVER DOME SNAP 22 D (MISCELLANEOUS) ×5 IMPLANT
KIT ENCORE 26 ADVANTAGE (KITS) ×2 IMPLANT
KIT MICROPUNCTURE NIT STIFF (SHEATH) ×2 IMPLANT
PROTECTION STATION PRESSURIZED (MISCELLANEOUS) ×3
SHEATH PINNACLE R/O II 6F 4CM (SHEATH) ×2 IMPLANT
SHEATH PROBE COVER 6X72 (BAG) ×3 IMPLANT
STATION PROTECTION PRESSURIZED (MISCELLANEOUS) ×2 IMPLANT
STOPCOCK MORSE 400PSI 3WAY (MISCELLANEOUS) ×3 IMPLANT
TRAY PV CATH (CUSTOM PROCEDURE TRAY) ×3 IMPLANT
TUBING CIL FLEX 10 FLL-RA (TUBING) ×3 IMPLANT
WIRE STARTER BENTSON 035X150 (WIRE) ×2 IMPLANT

## 2021-04-15 NOTE — Op Note (Signed)
   PATIENT: [name]      MRN: [id number] DOB: 03/07/1951    DATE OF PROCEDURE: 04/15/2021  INDICATIONS:    NETHA DAFOE is a 70 y.o. female who is having bleeding after dialysis from her fistula.  On exam the fistula was pulsatile.  She presents for a fistulogram.  Of note she is had multiple previous interventions according to the patient.  PROCEDURE:    1.  Ultrasound-guided access to the right AV fistula 2.  Fistulogram right brachiocephalic fistula 3.  Drug-coated balloon angioplasty of cephalic arch stenosis   SURGEON: Judeth Cornfield. Scot Dock, MD, FACS  ANESTHESIA: Local  EBL: Minimal  TECHNIQUE: The patient was brought to the peripheral vascular lab.  The right arm was prepped and draped in the usual sterile fashion.  Under ultrasound guidance, after the skin was anesthetized, I cannulated the proximal fistula with a micropuncture needle and a micropuncture sheath was introduced over the wire.  There was no clot noted at this level.  A real-time image was sent to the server.  A fistulogram was then obtained to evaluate the fistula from the point of cannulation to include the central veins.  The only significant finding was a 2-1/2 cm long stenosis in the distal cephalic vein before entering in the subclavian vein.  This was a 95% stenosis.  I elected to address this with venoplasty.  The micropuncture sheath was exchanged for a 6 French sheath over a Bentson wire.  The patient received 2000 units of IV heparin.  I was able to get the wire through the graft into the central veins.  I initially ballooned the stenosis with a 6 mm x 4 cm Mustang balloon which was inflated to 16 atm for 1 minute.  Follow-up film some some residual stenosis.  I went back with a 8 mm x 4 cm balloon and this was inflated for 1 minute.  While the balloon was inflated I did a retrograde shot to evaluate the arterial anastomosis and there was no problems identified at the arterial end.  I then went back with an  8 mm x 6 cm drug-coated balloon (loop tonics) which was inflated to 6 atm for 3 minutes.  Follow-up film showed an excellent result with a less than 15% residual stenosis.  FINDINGS:   1.  95% stenosis in the distal cephalic vein before entering the subclavian vein which was successfully addressed with drug-coated balloon angioplasty as described above.  There is a less than 15% residual stenosis. 2.  No central venous stenosis 3.  No problem at the arterial anastomosis 4.  Otherwise widely patent fistula.    Deitra Mayo, MD, FACS [facility]  DATE OF DICTATION:   04/15/2021

## 2021-04-15 NOTE — Interval H&P Note (Signed)
History and Physical Interval Note:  04/15/2021 10:39 AM  [name]  has presented today for surgery, with the diagnosis of instage renal.  The various methods of treatment have been discussed with the patient and family. After consideration of risks, benefits and other options for treatment, the patient has consented to  Procedure(s): A/V FISTULAGRAM - Right Arm (N/A) as a surgical intervention.  [name] history has been reviewed, patient examined, no change in status, stable for surgery.  I have reviewed the patient's chart and labs.  Questions were answered to the patient's satisfaction.     Deitra Mayo

## 2021-04-15 NOTE — Discharge Instructions (Signed)
Dialysis Fistulogram, Care After The following information offers guidance on how to care for yourself after your procedure. Your health care provider may also give you more specific instructions. If you have problems or questions, contact your health care provider. What can I expect after the procedure? After the procedure, it is common to have:  A small amount of discomfort in the area where the small tube (catheter) was placed for the procedure.  A small amount of bruising around the fistula.  Sleepiness and tiredness (fatigue). Follow these instructions at home: Puncture site care  Follow instructions from your health care provider about how to take care of the site where catheters were inserted. Make sure you: ? Wash your hands with soap and water for at least 20 seconds before and after you change your bandage (dressing). If soap and water are not available, use hand sanitizer. ? Change your dressing as told by your health care provider. ? Leave stitches (sutures), skin glue, or adhesive strips in place. These skin closures may need to stay in place for 2 weeks or longer. If adhesive strip edges start to loosen and curl up, you may trim the loose edges. Do not remove adhesive strips completely unless your health care provider tells you to do that.  Check your puncture area every day for signs of infection. Check for: ? More redness, swelling, or pain. ? Fluid or blood. ? Warmth. ? Pus or a bad smell.   Activity  Rest as much as you can.  If you were given a sedative during the procedure, it can affect you for several hours. Do not drive or operate machinery until your health care provider says that it is safe.  Do not lift anything that is heavier than 5 lb (2.3 kg), or the limit that you are told, on the day of your procedure.  Do not do anything strenuous with your arm for the rest of the day. Avoid household activities, such as vacuuming.  Return to your normal activities as  told by your health care provider. Ask your health care provider what activities are safe for you. Safety To prevent damage to your graft or fistula:  Do not wear tight-fitting clothing or jewelry on the arm or leg that has your graft or fistula.  Tell all your health care providers that you have a dialysis fistula or graft.  Do not allow blood draws, IVs, or blood pressure readings to be done in the arm that has your fistula or graft.  Do not allow flu shots or vaccinations in the arm with your fistula or graft. General instructions  Take over-the-counter and prescription medicines only as told by your health care provider.  Do not take baths, swim, or use a hot tub until your health care provider approves. Ask your health care provider if you may take showers. You may only be allowed to take sponge baths.  Monitor your dialysis fistula closely. Check to make sure that you can feel a vibration or buzz (a thrill) when you put your fingers over the fistula.  Keep all follow-up visits. This is important. Contact a health care provider if:  You have more redness, swelling, or pain at the site where the catheter was put in.  You have fluid or blood coming from the catheter site.  You have pus or a bad smell coming from the catheter site.  Your catheter site feels warm.  You have a fever or chills. Get help right away if:    You have bleeding from the vascular access site that does not stop.  You feel weak.  You have trouble balancing.  You have trouble moving your arms or legs.  You have problems with your speech or vision.  You can no longer feel a vibration or buzz when you put your fingers over your fistula.  The limb that was used for the procedure swells or becomes painful, cold, blue, or pale white.  You have chest pain or shortness of breath. These symptoms may represent a serious problem that is an emergency. Do not wait to see if the symptoms will go away. Get  medical help right away. Call your local emergency services (911 in the [place] Do not drive yourself to the hospital. Summary  After a dialysis fistulogram, it is common to have a small amount of discomfort or bruising in the area where the small, thin tube (catheter) was placed.  Rest as much as you can after your procedure. Return to your normal activities as told by your health care provider.  Take over-the-counter and prescription medicines only as told by your health care provider.  Follow instructions from your health care provider about how to take care of the site where the catheter was inserted.  Keep all follow-up visits. This is important. This information is not intended to replace advice given to you by your health care provider. Make sure you discuss any questions you have with your health care provider. Document Revised: 06/30/2020 Document Reviewed: 06/30/2020 Elsevier Patient Education  2021 [employer] Inc.  

## 2021-04-18 ENCOUNTER — Telehealth: Payer: Self-pay

## 2021-04-20 NOTE — Telephone Encounter (Signed)
LEFT VM REQUESTING CALL BACK

## 2021-04-21 ENCOUNTER — Telehealth: Payer: Self-pay | Admitting: Family Medicine

## 2021-04-26 DIAGNOSIS — Z992 Dependence on renal dialysis: Secondary | ICD-10-CM | POA: Diagnosis not present

## 2021-04-26 NOTE — Telephone Encounter (Signed)
Left VM requesting call back No answer

## 2021-04-27 ENCOUNTER — Other Ambulatory Visit: Payer: Self-pay

## 2021-04-27 ENCOUNTER — Ambulatory Visit (INDEPENDENT_AMBULATORY_CARE_PROVIDER_SITE_OTHER): Payer: Medicare Other | Admitting: Family Medicine

## 2021-04-27 ENCOUNTER — Encounter: Payer: Self-pay | Admitting: Family Medicine

## 2021-04-27 ENCOUNTER — Telehealth: Payer: Self-pay | Admitting: Pharmacist

## 2021-04-27 VITALS — BP 109/43 | HR 70 | Temp 97.0°F | Ht 63.0 in | Wt 194.4 lb

## 2021-04-27 DIAGNOSIS — Z794 Long term (current) use of insulin: Secondary | ICD-10-CM | POA: Diagnosis not present

## 2021-04-27 DIAGNOSIS — E1165 Type 2 diabetes mellitus with hyperglycemia: Secondary | ICD-10-CM

## 2021-04-27 DIAGNOSIS — E1122 Type 2 diabetes mellitus with diabetic chronic kidney disease: Secondary | ICD-10-CM | POA: Diagnosis not present

## 2021-04-27 DIAGNOSIS — N186 End stage renal disease: Secondary | ICD-10-CM

## 2021-04-27 DIAGNOSIS — Z992 Dependence on renal dialysis: Secondary | ICD-10-CM

## 2021-04-27 DIAGNOSIS — M5416 Radiculopathy, lumbar region: Secondary | ICD-10-CM | POA: Diagnosis not present

## 2021-04-27 DIAGNOSIS — I200 Unstable angina: Secondary | ICD-10-CM | POA: Diagnosis not present

## 2021-04-27 MED ORDER — BREZTRI AEROSPHERE 160-9-4.8 MCG/ACT IN AERO: 2.0000 | INHALATION_SPRAY | Freq: Two times a day (BID) | RESPIRATORY_TRACT | 11 refills | Status: DC

## 2021-04-27 MED ORDER — ROPINIROLE HCL 1 MG PO TABS: 1.0000 mg | ORAL_TABLET | Freq: Every day | ORAL | 5 refills | Status: DC

## 2021-04-27 NOTE — Telephone Encounter (Signed)
APPLICATION SUBMITTED FOR BREZTRI Patient re-[place] for BREZTRI for [id number] Application filled out online Medication will ship to patient's home RX escribed to [facility] with 3 refills

## 2021-04-27 NOTE — Progress Notes (Signed)
Subjective:  Patient ID: [name], female    DOB: Apr 09, 1951  Age: 70 y.o. MRN: [id number]  CC: Medical Management of Chronic Issues   HPI PEGGE NIPPLE presents for recheck of back and leg pain. Unchanged from last visit. Neuro declined to do NCV because it was obviously coming from her back. Recommended follow up with neurosurgery. She is going to get on the kidney transplant list.  Depression screen [facility] 2/9 04/27/2021 04/27/2021 03/02/2021  Decreased Interest 0 0 0  Down, Depressed, Hopeless 0 0 0  PHQ - 2 Score 0 0 0  Altered sleeping 1 - 1  Tired, decreased energy 1 - 3  Change in appetite 0 - 1  Feeling bad or failure about yourself  0 - 0  Trouble concentrating 0 - 0  Moving slowly or fidgety/restless 0 - 0  Suicidal thoughts 0 - 0  PHQ-9 Score 2 - 5  Difficult doing work/chores Not difficult at all - Not difficult at all  Some recent data might be hidden    History Zaela has a past medical history of Acute on chronic diastolic CHF (congestive heart failure) ([place]) (03/05/2015), Anemia, Anxiety, Arthritis, CAD (coronary artery disease), Chronic kidney disease, Chronic neck pain, COPD (chronic obstructive pulmonary disease) (HCC), Degenerative joint disease, Diabetic neuropathy ([place]), Diverticulosis, Esophageal stricture, GERD (gastroesophageal reflux disease), Hyperlipidemia, Hypertension, IDDM (insulin dependent diabetes mellitus), Pericardial effusion (03/06/2015), Peripheral vascular disease ([place]), Stroke ([place]), and Vitamin D deficiency.   She has a past surgical history that includes Bladder surgery; Partial hysterectomy; Shoulder surgery (Right); Cervical spine surgery (2012); Abdominal hysterectomy; Laminectomy (12/29/2011); cardiac stents; Lumbar laminectomy/decompression microdiscectomy (12/29/2011); Back surgery; Cystoscopy with injection (N/A, 06/03/2013); Cardiac catheterization (2003, 2005, 2009); Cataract extraction w/PHACO (Right, 05/04/2014); Cataract extraction  w/PHACO (Left, 07/20/2014); AV fistula placement (Right, 07/20/2015); Cardiac catheterization (N/A, 11/22/2015); Insertion of dialysis catheter (N/A, 11/26/2015); AV fistula placement (Right, 11/26/2015); Fistulogram (Right, 10/20/2016); A/V Fistulagram (N/A, 08/10/2017); PERIPHERAL VASCULAR INTERVENTION (08/10/2017); A/V Fistulagram (N/A, 03/03/2020); PERIPHERAL VASCULAR BALLOON ANGIOPLASTY (03/03/2020); and PERIPHERAL VASCULAR BALLOON ANGIOPLASTY (Right, 04/15/2021).   Her family history includes Cancer in her father; Diabetes in her daughter; Heart disease in an other family member; Hypertension in her daughter, daughter, father, and mother; Pancreatitis in her brother.She reports that she quit smoking about 19 years ago. She has a 30.00 pack-year smoking history. She has never used smokeless tobacco. She reports that she does not drink alcohol and does not use drugs.    ROS Review of Systems  Constitutional: Negative.   HENT: Negative.   Eyes: Negative for visual disturbance.  Respiratory: Negative for shortness of breath.   Cardiovascular: Negative for chest pain.  Gastrointestinal: Negative for abdominal pain.  Musculoskeletal: Negative for arthralgias.    Objective:  BP (!) 109/43   Pulse 70   Temp (!) 97 F (36.1 C)   Ht '5\' 3"'$  (1.6 m)   Wt 194 lb 6.4 oz (88.2 kg)   SpO2 99%   BMI 34.44 kg/m   BP Readings from Last 3 Encounters:  04/27/21 (!) 109/43  04/15/21 (!) 148/69  04/13/21 (!) 149/75    Wt Readings from Last 3 Encounters:  04/27/21 194 lb 6.4 oz (88.2 kg)  04/15/21 189 lb (85.7 kg)  04/13/21 193 lb (87.5 kg)     Physical Exam Constitutional:      General: She is not in acute distress.    Appearance: She is well-developed.  Cardiovascular:     Rate and Rhythm: Normal rate and regular  rhythm.  Pulmonary:     Breath sounds: Normal breath sounds.  Skin:    General: Skin is warm and dry.  Neurological:     Mental Status: She is alert and oriented to person, place,  and time.       Assessment & Plan:   [place] was seen today for medical management of chronic issues.  Diagnoses and all orders for this visit:  Lumbar radiculopathy -     Ambulatory referral to Neurosurgery  Type 2 diabetes mellitus with hyperglycemia, with long-term current use of insulin ([place])  End stage renal disease on dialysis due to type 2 diabetes mellitus ([place])  Other orders -     rOPINIRole (REQUIP) 1 MG tablet; Take 1 tablet (1 mg total) by mouth at bedtime. For leg cramps       I am having Cooper Render. Millea start on rOPINIRole. I am also having her maintain her aspirin, calcium acetate, nitroGLYCERIN, acetaminophen, Carboxymethylcellul-Glycerin (LUBRICATING EYE DROPS OP), albuterol, OneTouch Ultra, cetirizine, atenolol, Spiriva Respimat, albuterol, ondansetron, busPIRone, polyethylene glycol powder, Basaglar KwikPen, rosuvastatin, meclizine, isosorbide mononitrate, DULoxetine, gabapentin, NIFEdipine, QUEtiapine, predniSONE, and montelukast. We will continue to administer sodium chloride flush, sodium chloride flush, and sodium chloride.  Allergies as of 04/27/2021      Reactions   Azithromycin    Prolonged QT   Ace Inhibitors Cough   Clopidogrel Bisulfate Other (See Comments)   Sick, Headache, "Felt terrible"   Codeine Other (See Comments)   hallucinations   Lisinopril Cough   Penicillins Other (See Comments)   Unknown reaction Did it involve swelling of the face/tongue/throat, SOB, or low BP? Unknown Did it involve sudden or severe rash/hives, skin peeling, or any reaction on the inside of your mouth or nose? Unknown Did you need to seek medical attention at a hospital or doctor's office? Unknown When did it last happen?childhood allergy If all above answers are "NO", may proceed with cephalosporin use.      Medication List       Accurate as of Apr 27, 2021 11:12 PM. If you have any questions, ask your nurse or doctor.        acetaminophen 500 MG  tablet Commonly known as: TYLENOL Take 1,000 mg by mouth every 6 (six) hours as needed for moderate pain or headache.   albuterol (2.5 MG/3ML) 0.083% nebulizer solution Commonly known as: PROVENTIL Take 3 mLs (2.5 mg total) by nebulization every 6 (six) hours as needed for wheezing or shortness of breath.   albuterol 108 (90 Base) MCG/ACT inhaler Commonly known as: VENTOLIN HFA Inhale 2 puffs into the lungs every 6 (six) hours as needed for wheezing or shortness of breath.   aspirin 81 MG EC tablet Take 81 mg by mouth daily.   atenolol 50 MG tablet Commonly known as: TENORMIN Take 1 tablet (50 mg total) by mouth 2 (two) times daily.   Basaglar KwikPen 100 UNIT/ML Inject 20 Units into the skin at bedtime. Add five units each time the fasting is over 125 three days in a row. What changed: how much to take   SunGard 160-9-4.8 MCG/ACT Aero Generic drug: Budeson-Glycopyrrol-Formoterol Inhale 2 puffs into the lungs in the morning and at bedtime. PLEASE FILL 3 MONTH SUPPLY WITH 4 REFILLS What changed: additional instructions Changed by: Lavera Guise, RPH   busPIRone 7.5 MG tablet Commonly known as: BUSPAR Take 1 tablet (7.5 mg total) by mouth 3 (three) times daily as needed. What changed: reasons to take this  calcium acetate 667 MG capsule Commonly known as: PHOSLO Take 667-1,334 mg by mouth See admin instructions. Take 1334 mg with meals 3 times daily and 667 mg with snacks   cetirizine 10 MG tablet Commonly known as: ZYRTEC Take 1 tablet (10 mg total) by mouth daily as needed for allergies.   DULoxetine 20 MG capsule Commonly known as: CYMBALTA Take 1 capsule (20 mg total) by mouth daily.   gabapentin 400 MG capsule Commonly known as: NEURONTIN Take 1 capsule (400 mg total) by mouth See admin instructions. Take it after HD   isosorbide mononitrate 30 MG 24 hr tablet Commonly known as: IMDUR Take 0.5 tablets (15 mg total) by mouth daily.   LUBRICATING  EYE DROPS OP Place 1 drop into both eyes daily as needed (dry eyes).   meclizine 25 MG tablet Commonly known as: ANTIVERT Take 25 mg by mouth 3 (three) times daily.   montelukast 10 MG tablet Commonly known as: SINGULAIR Take 1 tablet (10 mg total) by mouth at bedtime.   NIFEdipine 60 MG 24 hr tablet Commonly known as: PROCARDIA XL/NIFEDICAL XL Take 1 tablet (60 mg total) by mouth daily. (ON NON-DIALYSIS DAYS) What changed:   when to take this  additional instructions   nitroGLYCERIN 0.4 MG SL tablet Commonly known as: NITROSTAT Place 1 tablet (0.4 mg total) under the tongue every 5 (five) minutes as needed for chest pain.   ondansetron 4 MG tablet Commonly known as: ZOFRAN TAKE 1 TABLET BY MOUTH EVERY 8 HOURS AS NEEDED FOR NAUSEA AND VOMITING What changed: See the new instructions.   OneTouch Ultra test strip Generic drug: glucose blood Use to test blood sugar 3 times daily as directed. DX: E11.9   polyethylene glycol powder 17 GM/SCOOP powder Commonly known as: GLYCOLAX/MIRALAX Take 17 g by mouth 2 (two) times daily as needed for moderate constipation. For constipation   predniSONE 10 MG tablet Commonly known as: DELTASONE Take 1 tablet (10 mg total) by mouth daily with breakfast.   QUEtiapine 50 MG tablet Commonly known as: SEROQUEL Take 1 tablet (50 mg total) by mouth at bedtime. For sleep and for anxiety   rOPINIRole 1 MG tablet Commonly known as: REQUIP Take 1 tablet (1 mg total) by mouth at bedtime. For leg cramps Started by: [name], MD   rosuvastatin 5 MG tablet Commonly known as: Crestor Take 1 tablet (5 mg total) by mouth daily. For cholesterol What changed: when to take this   Spiriva Respimat 1.25 MCG/ACT Aers Generic drug: Tiotropium Bromide Monohydrate Inhale 2 puffs into the lungs daily.        Follow-up: Return in about 6 weeks (around 06/08/2021).  [name], M.D.

## 2021-05-03 DIAGNOSIS — N186 End stage renal disease: Secondary | ICD-10-CM | POA: Diagnosis not present

## 2021-05-03 DIAGNOSIS — Z992 Dependence on renal dialysis: Secondary | ICD-10-CM | POA: Diagnosis not present

## 2021-05-04 DIAGNOSIS — H40013 Open angle with borderline findings, low risk, bilateral: Secondary | ICD-10-CM | POA: Diagnosis not present

## 2021-05-04 DIAGNOSIS — H04123 Dry eye syndrome of bilateral lacrimal glands: Secondary | ICD-10-CM | POA: Diagnosis not present

## 2021-05-05 DIAGNOSIS — N186 End stage renal disease: Secondary | ICD-10-CM | POA: Diagnosis not present

## 2021-05-05 DIAGNOSIS — Z992 Dependence on renal dialysis: Secondary | ICD-10-CM | POA: Diagnosis not present

## 2021-05-05 DIAGNOSIS — M5416 Radiculopathy, lumbar region: Secondary | ICD-10-CM | POA: Insufficient documentation

## 2021-05-05 DIAGNOSIS — M545 Low back pain, unspecified: Secondary | ICD-10-CM | POA: Diagnosis not present

## 2021-05-05 DIAGNOSIS — D509 Iron deficiency anemia, unspecified: Secondary | ICD-10-CM | POA: Diagnosis not present

## 2021-05-05 DIAGNOSIS — E559 Vitamin D deficiency, unspecified: Secondary | ICD-10-CM | POA: Diagnosis not present

## 2021-05-05 DIAGNOSIS — D631 Anemia in chronic kidney disease: Secondary | ICD-10-CM | POA: Diagnosis not present

## 2021-05-10 ENCOUNTER — Other Ambulatory Visit: Payer: Self-pay | Admitting: Neurosurgery

## 2021-05-10 DIAGNOSIS — M545 Low back pain, unspecified: Secondary | ICD-10-CM

## 2021-05-15 ENCOUNTER — Other Ambulatory Visit: Payer: Self-pay | Admitting: Family Medicine

## 2021-05-15 DIAGNOSIS — I152 Hypertension secondary to endocrine disorders: Secondary | ICD-10-CM

## 2021-05-17 ENCOUNTER — Telehealth: Payer: Self-pay | Admitting: *Deleted

## 2021-05-17 NOTE — Telephone Encounter (Signed)
Pt called and aware by detailed VM that #2 Breztri samples are here for her to pick up -these are from Pt assistance

## 2021-05-17 NOTE — Progress Notes (Signed)
Cardiology Office Note   Date:  05/18/2021   ID:  [name], DOB 1951-06-25, MRN [id number]  PCP:  [name], MD  Cardiologist:   Minus Breeding, MD   Chief Complaint  Patient presents with   Leg Pain       History of Present Illness: [name] is a 70 y.o. female who presents for follow up of CAD and diastolic heart failure with diastolic dysfunction.   She was in the hospital in March with chest pain with non diagnostic troponin.  It was thought to be atypical and she was managed conservatively.     She reports that her meds were changed significantly when she was in the hospital.  Really looks like one of the biggest additions was isosorbide.  Her nifedipine was stopped.  Hydralazine was stopped.  Torsemide was stopped.  She since restarted her nifedipine because her blood pressure was elevated.  This was restarted and previous appointment but at a lower dose instead of 90 mg which she had been taking she has been on 57.  She stopped the isosorbide on her own since she was last seen because of dialysis her heart rate will be going fast and it seems to have taken care of the problem.  She has not had any new chest pressure, neck or arm discomfort.  She is not had any of the acute shortness of breath that has led to the last couple of hospitalizations.  She does report that there could be some salt intake occasionally and she does not know if this might precipitate her events.  Of note she is being worked up at [facility] for possible renal transplant.  She says she is tolerating dialysis now except that she has cramping.  She is not having a heart rates in the 130s that she was having.  She is not having any presyncope or syncope.  She is not having any new chest pressure, neck or arm discomfort.   Past Medical History:  Diagnosis Date   Acute on chronic diastolic CHF (congestive heart failure) ([place]) 03/05/2015   Grade 2. EF 60-65%   Anemia    hx of   Anxiety     Arthritis    CAD (coronary artery disease)    s/p Stenting in 2005;  [place] 7/09: Vigorous LV function, 2-3+ MR, RI 40%, RI stent patent, proximal-mid RCA 40-50%;  Echo 7/09:  EF 55-65%, trivial MR;  Myoview 11/18/12: EF 66%, normal LV wall motion, mild to moderate anterior ischemia - reviewed by [facility] and felt to rep breast atten (low risk);  Echo 6/14: mild LVH, EF 55-65%, Gr 2 DD, PASP 44, trivial eff    Chronic kidney disease    CREATININE IS UP--GOING TO KIDNEY MD    Chronic neck pain    COPD (chronic obstructive pulmonary disease) (HCC)    Degenerative joint disease    left shoulder   Diabetic neuropathy (HCC)    Diverticulosis    Esophageal stricture    GERD (gastroesophageal reflux disease)    Hyperlipidemia    Hypertension    IDDM (insulin dependent diabetes mellitus)    Pericardial effusion 03/06/2015   Very small per echo ; pleural nodules seen on CT chest.   Peripheral vascular disease ([place])    Stroke ([place])    "mini- years ago"   Vitamin D deficiency     Past Surgical History:  Procedure Laterality Date   A/V FISTULAGRAM N/A 08/10/2017   Procedure:  A/V Fistulagram - Right Arm;  Surgeon: Elam Dutch, MD;  Location: [place] [facility];  Service: Cardiovascular;  Laterality: N/A;   A/V FISTULAGRAM N/A 03/03/2020   Procedure: A/V FISTULAGRAM - Right Arm;  Surgeon: Marty Heck, MD;  Location: [place] [facility];  Service: Cardiovascular;  Laterality: N/A;   ABDOMINAL HYSTERECTOMY     AV FISTULA PLACEMENT Right 07/20/2015   Procedure: RADIAL CEPHALIC ARTERIOVENOUS FISTULA CREATION RIGHT ARM;  Surgeon: Angelia Mould, MD;  Location: [place];  Service: Vascular;  Laterality: Right;   AV FISTULA PLACEMENT Right 11/26/2015   Procedure:  Creation of RIGHT BRACHIO-CEPHALIC arteriovenous fistula;  Surgeon: Angelia Mould, MD;  Location: [facility];  Service: Vascular;  Laterality: Right;   BACK SURGERY     2007 & 2013   [place]   CARDIAC  CATHETERIZATION  2003, 2005, 2009   1 stent   cardiac stents     CATARACT EXTRACTION W/PHACO Right 05/04/2014   Procedure: CATARACT EXTRACTION PHACO AND INTRAOCULAR LENS PLACEMENT ([place]);  Surgeon: Williams Che, MD;  Location: [facility];  Service: Ophthalmology;  Laterality: Right;  CDE 1.47   CATARACT EXTRACTION W/PHACO Left 07/20/2014   Procedure: CATARACT EXTRACTION WITH PHACO AND INTRAOCULAR LENS PLACEMENT LEFT EYE CDE=1.79;  Surgeon: Williams Che, MD;  Location: [facility];  Service: Ophthalmology;  Laterality: Left;   CERVICAL SPINE SURGERY  2012   8 screws   CYSTOSCOPY WITH INJECTION N/A 06/03/2013   Procedure: MACROPLASTIQUE  INJECTION ;  Surgeon: Malka So, MD;  Location: [facility];  Service: Urology;  Laterality: N/A;   FISTULOGRAM Right 10/20/2016   Procedure: FISTULOGRAM WITH POSSIBLE INTERVENTION;  Surgeon: [name], MD;  Location: [facility];  Service: Vascular;  Laterality: Right;   INSERTION OF DIALYSIS CATHETER N/A 11/26/2015   Procedure: INSERTION OF DIALYSIS CATHETER;  Surgeon: Angelia Mould, MD;  Location: [place];  Service: Vascular;  Laterality: N/A;   LAMINECTOMY  12/29/2011   LUMBAR LAMINECTOMY/DECOMPRESSION MICRODISCECTOMY  12/29/2011   Procedure: LUMBAR LAMINECTOMY/DECOMPRESSION MICRODISCECTOMY;  Surgeon: Floyce Stakes, MD;  Location: [place] [facility];  Service: Neurosurgery;  Laterality: N/A;  Lumbar Two through Lumbar Five Laminectomies/Cellsaver   PARTIAL HYSTERECTOMY     PERIPHERAL VASCULAR BALLOON ANGIOPLASTY  03/03/2020   Procedure: PERIPHERAL VASCULAR BALLOON ANGIOPLASTY;  Surgeon: Marty Heck, MD;  Location: [place] [facility];  Service: Cardiovascular;;  Rt arm fistula   PERIPHERAL VASCULAR BALLOON ANGIOPLASTY Right 04/15/2021   Procedure: PERIPHERAL VASCULAR BALLOON ANGIOPLASTY;  Surgeon: Angelia Mould, MD;  Location: [place] [facility];  Service: Cardiovascular;  Laterality: Right;  arm fistula   PERIPHERAL VASCULAR CATHETERIZATION N/A  11/22/2015   Procedure: Fistulagram;  Surgeon: Angelia Mould, MD;  Location: [place] [facility];  Service: Cardiovascular;  Laterality: N/A;   PERIPHERAL VASCULAR INTERVENTION  08/10/2017   Procedure: PERIPHERAL VASCULAR INTERVENTION;  Surgeon: Elam Dutch, MD;  Location: [place] [facility];  Service: Cardiovascular;;   SHOULDER SURGERY Right    Rotator cuff     Current Outpatient Medications  Medication Sig Dispense Refill   acetaminophen (TYLENOL) 500 MG tablet Take 1,000 mg by mouth every 6 (six) hours as needed for moderate pain or headache.     albuterol (PROVENTIL) (2.5 MG/3ML) 0.083% nebulizer solution Take 3 mLs (2.5 mg total) by nebulization every 6 (six) hours as needed for wheezing or shortness of breath. 75 mL 12   albuterol (VENTOLIN HFA)  108 (90 Base) MCG/ACT inhaler Inhale 2 puffs into the lungs every 6 (six) hours as needed for wheezing or shortness of breath. 1 each 2   aspirin 81 MG EC tablet Take 81 mg by mouth daily.     atenolol (TENORMIN) 50 MG tablet Take 1 tablet by mouth twice daily 180 tablet 0   Budeson-Glycopyrrol-Formoterol (BREZTRI AEROSPHERE) 160-9-4.8 MCG/ACT AERO Inhale 2 puffs into the lungs in the morning and at bedtime. PLEASE FILL 3 MONTH SUPPLY WITH 4 REFILLS 5.9 g 11   busPIRone (BUSPAR) 7.5 MG tablet Take 1 tablet (7.5 mg total) by mouth 3 (three) times daily as needed. 90 tablet 1   calcium acetate (PHOSLO) 667 MG capsule Take 667-1,334 mg by mouth See admin instructions. Take 1334 mg with meals 3 times daily and 667 mg with snacks     Carboxymethylcellul-Glycerin (LUBRICATING EYE DROPS OP) Place 1 drop into both eyes daily as needed (dry eyes).     cetirizine (ZYRTEC) 10 MG tablet Take 1 tablet (10 mg total) by mouth daily as needed for allergies. 90 tablet 3   DULoxetine (CYMBALTA) 20 MG capsule Take 1 capsule (20 mg total) by mouth daily. 30 capsule 3   gabapentin (NEURONTIN) 400 MG capsule Take 1 capsule (400 mg total) by mouth See admin  instructions. Take it after HD 30 capsule 1   glucose blood (ONETOUCH ULTRA) test strip Use to test blood sugar 3 times daily as directed. DX: E11.9 300 each 12   Insulin Glargine (BASAGLAR KWIKPEN) 100 UNIT/ML Inject 20 Units into the skin at bedtime. Add five units each time the fasting is over 125 three days in a row. 30 mL 1   montelukast (SINGULAIR) 10 MG tablet Take 1 tablet (10 mg total) by mouth at bedtime. 30 tablet 11   NIFEdipine (PROCARDIA XL/NIFEDICAL XL) 60 MG 24 hr tablet Take 60 mg by mouth daily.     nitroGLYCERIN (NITROSTAT) 0.4 MG SL tablet Place 1 tablet (0.4 mg total) under the tongue every 5 (five) minutes as needed for chest pain. 25 tablet 11   ondansetron (ZOFRAN) 4 MG tablet TAKE 1 TABLET BY MOUTH EVERY 8 HOURS AS NEEDED FOR NAUSEA AND VOMITING 20 tablet 0   polyethylene glycol powder (GLYCOLAX/MIRALAX) 17 GM/SCOOP powder Take 17 g by mouth 2 (two) times daily as needed for moderate constipation. For constipation 3350 g 5   QUEtiapine (SEROQUEL) 50 MG tablet Take 1 tablet (50 mg total) by mouth at bedtime. For sleep and for anxiety 30 tablet 2   rOPINIRole (REQUIP) 1 MG tablet Take 1 tablet (1 mg total) by mouth at bedtime. For leg cramps 30 tablet 5   rosuvastatin (CRESTOR) 5 MG tablet Take 1 tablet (5 mg total) by mouth daily. For cholesterol 90 tablet 3   Current Facility-Administered Medications  Medication Dose Route Frequency Provider Last Rate Last Admin   0.9 %  sodium chloride infusion  250 mL Intravenous PRN Angelia Mould, MD       sodium chloride flush (NS) 0.9 % injection 3 mL  3 mL Intravenous Q12H Angelia Mould, MD       sodium chloride flush (NS) 0.9 % injection 3 mL  3 mL Intravenous PRN Angelia Mould, MD        Allergies:   Azithromycin, Ace inhibitors, Clopidogrel bisulfate, Codeine, Lisinopril, and Penicillins   ROS:  Please see the history of present illness.   Otherwise, review of systems are positive for chronic leg pain  and back problems.   All other systems are reviewed and negative.    PHYSICAL EXAM: VS:  BP (!) 145/73   Pulse 86   Ht '5\' 3"'$  (1.6 m)   Wt 196 lb 3.2 oz (89 kg)   SpO2 98%   BMI 34.76 kg/m  , BMI Body mass index is 34.76 kg/m. GENERAL:  Well appearing NECK:  No jugular venous distention, waveform within normal limits, carotid upstroke brisk and symmetric, no bruits, no thyromegaly LUNGS:  Clear to auscultation bilaterally CHEST:  Unremarkable HEART:  PMI not displaced or sustained,S1 and S2 within normal limits, no S3, no S4, no clicks, no rubs, no murmurs ABD:  Flat, positive bowel sounds normal in frequency in pitch, no bruits, no rebound, no guarding, no midline pulsatile mass, no hepatomegaly, no splenomegaly EXT:  2 plus pulses upper and decreased dorsalis pedis and posterior tibialis bilateral lower, no edema, no cyanosis no clubbing, right upper arm dialysis fistula with thrill and bruit   EKG:  EKG is not ordered today.   Recent Labs: 09/15/2020: NT-Pro BNP 16,086 02/19/2021: ALT 21 03/07/2021: TSH 2.450 04/11/2021: Platelets 258.0 04/15/2021: BUN 46; Creatinine, Ser 8.60; Hemoglobin 10.2; Potassium 3.8; Sodium 137    Lipid Panel    Component Value Date/Time   CHOL 203 (H) 01/31/2021 1001   CHOL 241 (H) 03/25/2013 1026   TRIG 240 (H) 01/31/2021 1001   TRIG 147 10/09/2013 1527   TRIG 209 (H) 03/25/2013 1026   HDL 36 (L) 01/31/2021 1001   HDL 65 10/09/2013 1527   HDL 51 03/25/2013 1026   CHOLHDL 5.6 (H) 01/31/2021 1001   CHOLHDL 3.0 06/17/2008 0625   VLDL 19 06/17/2008 0625   LDLCALC 124 (H) 01/31/2021 1001   LDLCALC 125 (H) 10/09/2013 1527   LDLCALC 148 (H) 03/25/2013 1026      Wt Readings from Last 3 Encounters:  05/18/21 196 lb 3.2 oz (89 kg)  04/27/21 194 lb 6.4 oz (88.2 kg)  04/15/21 189 lb (85.7 kg)      Other studies Reviewed: Additional studies/ records that were reviewed today include:   Hospital records Review of the above records demonstrates:   Please see elsewhere in the note.     ASSESSMENT AND PLAN:    Chest pain, unspecified type She is not having any chest pain.  She had a negative stress test in November 2021.  At this point no change in therapy.   CAD in native artery She had previous stenting but no ongoing chest pain and stress perfusion testing as above.  She did not tolerate Imdur.  She will continue the aspirin.   Chronic diastolic heart failure (HCC) Volume is controlled with dialysis.  Pericardial effusion I will repeat an echocardiogram in September.   Essential hypertension Her blood pressure is somewhat labile which is not unexpected with dialysis.  Is much better than it was when she was not taking nifedipine so she will continue this.  No change in therapy.   ESRD (end stage renal disease) on dialysis ([place]) Tolerates dialysis.  No change in therapy.    Current medicines are reviewed at length with the patient today.  The patient does not have concerns regarding medicines.  The following changes have been made: As above  Labs/ tests ordered today include: None  Orders Placed This Encounter  Procedures   ECHOCARDIOGRAM COMPLETE      Disposition:   FU with me in six months in [place], Minus Breeding, MD  05/18/2021  11:00 AM    [place]

## 2021-05-18 ENCOUNTER — Telehealth: Payer: Self-pay | Admitting: Family Medicine

## 2021-05-18 ENCOUNTER — Encounter: Payer: Self-pay | Admitting: Cardiology

## 2021-05-18 ENCOUNTER — Ambulatory Visit (INDEPENDENT_AMBULATORY_CARE_PROVIDER_SITE_OTHER): Payer: Medicare Other | Admitting: Cardiology

## 2021-05-18 VITALS — BP 145/73 | HR 86 | Ht 63.0 in | Wt 196.2 lb

## 2021-05-18 DIAGNOSIS — I5032 Chronic diastolic (congestive) heart failure: Secondary | ICD-10-CM | POA: Diagnosis not present

## 2021-05-18 DIAGNOSIS — I313 Pericardial effusion (noninflammatory): Secondary | ICD-10-CM

## 2021-05-18 DIAGNOSIS — I2510 Atherosclerotic heart disease of native coronary artery without angina pectoris: Secondary | ICD-10-CM

## 2021-05-18 DIAGNOSIS — R079 Chest pain, unspecified: Secondary | ICD-10-CM

## 2021-05-18 DIAGNOSIS — I200 Unstable angina: Secondary | ICD-10-CM

## 2021-05-18 DIAGNOSIS — I10 Essential (primary) hypertension: Secondary | ICD-10-CM

## 2021-05-18 DIAGNOSIS — I3139 Other pericardial effusion (noninflammatory): Secondary | ICD-10-CM

## 2021-05-18 NOTE — Telephone Encounter (Signed)
REFERRAL REQUEST Telephone Note  Have you been seen at our office for this problem? Colonoscopy before August 8th. Pt needs this to get on list for kidney transplant  (Advise that they may need an appointment with their PCP before a referral can be done)  Reason for Referral: Colonoscopy Referral discussed with patient:  Best contact number of patient for referral team:   [phone] Has patient been seen by a specialist for this issue before:  Patient provider preference for referral: NO Patient location preference for referral: NO   Patient notified that referrals can take up to a week or longer to process. If they haven't heard anything within a week they should call back and speak with the referral department.

## 2021-05-18 NOTE — Patient Instructions (Addendum)
Medication Instructions:  Your physician recommends that you continue on your current medications as directed. Please refer to the Current Medication list given to you today.  Labwork: none  Testing/Procedures: Your physician has requested that you have an echocardiogram in September 2022. Echocardiography is a painless test that uses sound waves to create images of your heart. It provides your doctor with information about the size and shape of your heart and how well your heart's chambers and valves are working. This procedure takes approximately one hour. There are no restrictions for this procedure.  Follow-Up: Your physician recommends that you schedule a follow-up appointment in: 6 months in [place]  Any Other Special Instructions Will Be Listed Below (If Applicable).  If you need a refill on your cardiac medications before your next appointment, please call your pharmacy.

## 2021-05-19 ENCOUNTER — Other Ambulatory Visit: Payer: Self-pay | Admitting: Family Medicine

## 2021-05-19 DIAGNOSIS — Z1211 Encounter for screening for malignant neoplasm of colon: Secondary | ICD-10-CM

## 2021-05-19 NOTE — Telephone Encounter (Signed)
Referral placed, as requested WS 

## 2021-05-23 ENCOUNTER — Other Ambulatory Visit: Payer: Self-pay

## 2021-05-23 ENCOUNTER — Ambulatory Visit
Admission: RE | Admit: 2021-05-23 | Discharge: 2021-05-23 | Disposition: A | Discharge status: Home / Self Care | Payer: Medicare Other | Source: Ambulatory Visit | Attending: Neurosurgery | Admitting: Neurosurgery

## 2021-05-23 DIAGNOSIS — M545 Low back pain, unspecified: Secondary | ICD-10-CM

## 2021-05-23 DIAGNOSIS — M48061 Spinal stenosis, lumbar region without neurogenic claudication: Secondary | ICD-10-CM | POA: Diagnosis not present

## 2021-05-31 DIAGNOSIS — Z992 Dependence on renal dialysis: Secondary | ICD-10-CM | POA: Diagnosis not present

## 2021-05-31 DIAGNOSIS — E119 Type 2 diabetes mellitus without complications: Secondary | ICD-10-CM | POA: Diagnosis not present

## 2021-05-31 DIAGNOSIS — Z794 Long term (current) use of insulin: Secondary | ICD-10-CM | POA: Diagnosis not present

## 2021-06-02 DIAGNOSIS — Z992 Dependence on renal dialysis: Secondary | ICD-10-CM | POA: Diagnosis not present

## 2021-06-02 DIAGNOSIS — N186 End stage renal disease: Secondary | ICD-10-CM | POA: Diagnosis not present

## 2021-06-03 DIAGNOSIS — M545 Low back pain, unspecified: Secondary | ICD-10-CM | POA: Diagnosis not present

## 2021-06-04 DIAGNOSIS — N250 Renal osteodystrophy: Secondary | ICD-10-CM | POA: Diagnosis not present

## 2021-06-04 DIAGNOSIS — E559 Vitamin D deficiency, unspecified: Secondary | ICD-10-CM | POA: Diagnosis not present

## 2021-06-04 DIAGNOSIS — D631 Anemia in chronic kidney disease: Secondary | ICD-10-CM | POA: Diagnosis not present

## 2021-06-04 DIAGNOSIS — N186 End stage renal disease: Secondary | ICD-10-CM | POA: Diagnosis not present

## 2021-06-04 DIAGNOSIS — Z992 Dependence on renal dialysis: Secondary | ICD-10-CM | POA: Diagnosis not present

## 2021-06-04 DIAGNOSIS — D509 Iron deficiency anemia, unspecified: Secondary | ICD-10-CM | POA: Diagnosis not present

## 2021-06-05 DIAGNOSIS — U071 COVID-19: Secondary | ICD-10-CM | POA: Diagnosis not present

## 2021-06-05 DIAGNOSIS — Z23 Encounter for immunization: Secondary | ICD-10-CM | POA: Diagnosis not present

## 2021-06-08 ENCOUNTER — Ambulatory Visit (INDEPENDENT_AMBULATORY_CARE_PROVIDER_SITE_OTHER): Payer: Medicare Other | Admitting: Family Medicine

## 2021-06-08 ENCOUNTER — Encounter: Payer: Self-pay | Admitting: Family Medicine

## 2021-06-08 ENCOUNTER — Other Ambulatory Visit: Payer: Self-pay

## 2021-06-08 VITALS — BP 137/65 | HR 83 | Temp 97.6°F | Ht 63.0 in | Wt 195.8 lb

## 2021-06-08 DIAGNOSIS — E1140 Type 2 diabetes mellitus with diabetic neuropathy, unspecified: Secondary | ICD-10-CM | POA: Diagnosis not present

## 2021-06-08 DIAGNOSIS — E1169 Type 2 diabetes mellitus with other specified complication: Secondary | ICD-10-CM

## 2021-06-08 DIAGNOSIS — I152 Hypertension secondary to endocrine disorders: Secondary | ICD-10-CM | POA: Diagnosis not present

## 2021-06-08 DIAGNOSIS — I200 Unstable angina: Secondary | ICD-10-CM | POA: Diagnosis not present

## 2021-06-08 DIAGNOSIS — Z794 Long term (current) use of insulin: Secondary | ICD-10-CM

## 2021-06-08 DIAGNOSIS — F411 Generalized anxiety disorder: Secondary | ICD-10-CM

## 2021-06-08 DIAGNOSIS — E1159 Type 2 diabetes mellitus with other circulatory complications: Secondary | ICD-10-CM | POA: Diagnosis not present

## 2021-06-08 DIAGNOSIS — E785 Hyperlipidemia, unspecified: Secondary | ICD-10-CM

## 2021-06-08 MED ORDER — ONDANSETRON HCL 4 MG PO TABS: ORAL_TABLET | ORAL | 0 refills | Status: DC

## 2021-06-08 MED ORDER — QUETIAPINE FUMARATE 50 MG PO TABS: 50.0000 mg | ORAL_TABLET | Freq: Every day | ORAL | 3 refills | Status: DC

## 2021-06-09 ENCOUNTER — Other Ambulatory Visit (HOSPITAL_COMMUNITY)
Admission: RE | Admit: 2021-06-09 | Discharge: 2021-06-09 | Disposition: A | Discharge status: Home / Self Care | Payer: Medicare Other | Source: Ambulatory Visit | Attending: Family | Admitting: Family

## 2021-06-09 ENCOUNTER — Encounter: Payer: Self-pay | Admitting: Family

## 2021-06-09 ENCOUNTER — Ambulatory Visit (INDEPENDENT_AMBULATORY_CARE_PROVIDER_SITE_OTHER): Payer: Medicare Other | Admitting: Family

## 2021-06-09 VITALS — BP 136/67 | HR 82 | Temp 97.2°F | Ht 63.0 in | Wt 192.0 lb

## 2021-06-09 DIAGNOSIS — Z6834 Body mass index (BMI) 34.0-34.9, adult: Secondary | ICD-10-CM | POA: Diagnosis not present

## 2021-06-09 DIAGNOSIS — Z01419 Encounter for gynecological examination (general) (routine) without abnormal findings: Secondary | ICD-10-CM

## 2021-06-09 DIAGNOSIS — I10 Essential (primary) hypertension: Secondary | ICD-10-CM | POA: Diagnosis not present

## 2021-06-09 DIAGNOSIS — E669 Obesity, unspecified: Secondary | ICD-10-CM

## 2021-06-09 DIAGNOSIS — Z992 Dependence on renal dialysis: Secondary | ICD-10-CM

## 2021-06-09 DIAGNOSIS — N186 End stage renal disease: Secondary | ICD-10-CM

## 2021-06-09 NOTE — Progress Notes (Signed)
Subjective:  Patient ID: [name], female    DOB: [birth year]  Age: 70 y.o. MRN: [id number]  CC: Follow-up (Lumbar radiculopathy)   HPI [name] presents for recheck of her neck pain.  She says she is doing well.  She is having some pain ongoing in both legs.  It feels like a bone scraping bone inside her hips she tells me.  She has recently been to her neurosurgeon.  They evaluated her with an MRI.  She is having injections into the spine scheduled for 26 July.  In his report this is described as a lumbar spine medial branch block L3-L4 by Dr. Davy Baker also L4-L5 bilaterally again with Dr. Davy Baker.  Follow-up of diabetes. Patient does not check blood sugar at home Patient denies symptoms such as polyuria, polydipsia, excessive hunger, nausea No significant hypoglycemic spells noted. Medications as noted below. Taking them regularly without complication/adverse reaction being reported today.  Depression screen [facility] 2/9 06/08/2021 06/08/2021 04/27/2021  Decreased Interest 0 0 0  Down, Depressed, Hopeless 0 0 0  PHQ - 2 Score 0 0 0  Altered sleeping 1 - 1  Tired, decreased energy 1 - 1  Change in appetite 1 - 0  Feeling bad or failure about yourself  0 - 0  Trouble concentrating 0 - 0  Moving slowly or fidgety/restless 0 - 0  Suicidal thoughts 0 - 0  PHQ-9 Score 3 - 2  Difficult doing work/chores Not difficult at all - Not difficult at all  Some recent data might be hidden    History [name] has a past medical history of Acute on chronic diastolic CHF (congestive heart failure) ([place]) (03/05/2015), Anemia, Anxiety, Arthritis, CAD (coronary artery disease), Chronic kidney disease, Chronic neck pain, COPD (chronic obstructive pulmonary disease) (HCC), Degenerative joint disease, Diabetic neuropathy ([place]), Diverticulosis, Esophageal stricture, GERD (gastroesophageal reflux disease), Hyperlipidemia, Hypertension, IDDM (insulin dependent diabetes mellitus), Pericardial effusion  (03/06/2015), Peripheral vascular disease ([place]), Stroke ([place]), and Vitamin D deficiency.   She has a past surgical history that includes Bladder surgery; Partial hysterectomy; Shoulder surgery (Right); Cervical spine surgery (2012); Abdominal hysterectomy; Laminectomy (12/29/2011); cardiac stents; Lumbar laminectomy/decompression microdiscectomy (12/29/2011); Back surgery; Cystoscopy with injection (N/A, 06/03/2013); Cardiac catheterization (2003, 2005, 2009); Cataract extraction w/PHACO (Right, 05/04/2014); Cataract extraction w/PHACO (Left, 07/20/2014); AV fistula placement (Right, 07/20/2015); Cardiac catheterization (N/A, 11/22/2015); Insertion of dialysis catheter (N/A, 11/26/2015); AV fistula placement (Right, 11/26/2015); Fistulogram (Right, 10/20/2016); A/V Fistulagram (N/A, 08/10/2017); PERIPHERAL VASCULAR INTERVENTION (08/10/2017); A/V Fistulagram (N/A, 03/03/2020); PERIPHERAL VASCULAR BALLOON ANGIOPLASTY (03/03/2020); and PERIPHERAL VASCULAR BALLOON ANGIOPLASTY (Right, 04/15/2021).   Her family history includes Cancer in her father; Diabetes in her daughter; Heart disease in an other family member; Hypertension in her daughter, daughter, father, and mother; Pancreatitis in her brother.She reports that she quit smoking about 19 years ago. Her smoking use included cigarettes. She has a 30.00 pack-year smoking history. She has never used smokeless tobacco. She reports that she does not drink alcohol and does not use drugs.    ROS Review of Systems  Constitutional: Negative.   HENT: Negative.    Eyes:  Negative for visual disturbance.  Respiratory:  Negative for shortness of breath.   Cardiovascular:  Negative for chest pain.  Gastrointestinal:  Negative for abdominal pain.  Musculoskeletal:  Negative for arthralgias.   Objective:  BP 137/65   Pulse 83   Temp 97.6 F (36.4 C)   Ht '5\' 3"'  (1.6 m)   Wt 195 lb 12.8 oz (88.8 kg)  SpO2 95%   BMI 34.68 kg/m   BP Readings from Last 3 Encounters:   06/08/21 137/65  05/18/21 (!) 145/73  04/27/21 (!) 109/43    Wt Readings from Last 3 Encounters:  06/08/21 195 lb 12.8 oz (88.8 kg)  05/18/21 196 lb 3.2 oz (89 kg)  04/27/21 194 lb 6.4 oz (88.2 kg)     Physical Exam Constitutional:      General: She is not in acute distress.    Appearance: She is well-developed.  Cardiovascular:     Rate and Rhythm: Normal rate and regular rhythm.  Pulmonary:     Breath sounds: Normal breath sounds.  Musculoskeletal:        General: Normal range of motion.  Skin:    General: Skin is warm and dry.  Neurological:     Mental Status: She is alert and oriented to person, place, and time.     Assessment & Plan:   [name] was seen today for follow-up.  Diagnoses and all orders for this visit:  Type 2 diabetes mellitus with hyperglycemia, with long-term current use of insulin ([place])  Type 2 diabetes mellitus with diabetic neuropathy, with long-term current use of insulin (HCC) -     Cancel: Bayer DCA Hb A1c Waived -     Cancel: CBC with Differential/Platelet  Hyperlipidemia associated with type 2 diabetes mellitus (HCC) -     Cancel: Lipid panel  Hypertension associated with diabetes ([place]) -     Cancel: CMP14+EGFR  GAD (generalized anxiety disorder) -     QUEtiapine (SEROQUEL) 50 MG tablet; Take 1 tablet (50 mg total) by mouth at bedtime. For sleep and for anxiety  Other orders -     ondansetron (ZOFRAN) 4 MG tablet; TAKE 1 TABLET BY MOUTH EVERY 8 HOURS AS NEEDED FOR NAUSEA AND VOMITING       I am having [name] maintain her aspirin, calcium acetate, nitroGLYCERIN, acetaminophen, Carboxymethylcellul-Glycerin (LUBRICATING EYE DROPS OP), albuterol, OneTouch Ultra, cetirizine, albuterol, busPIRone, polyethylene glycol powder, Basaglar KwikPen, rosuvastatin, DULoxetine, gabapentin, montelukast, Breztri Aerosphere, rOPINIRole, atenolol, NIFEdipine, ondansetron, and QUEtiapine. We will continue to administer sodium chloride flush,  sodium chloride flush, and sodium chloride.  Allergies as of 06/08/2021       Reactions   Azithromycin    Prolonged QT   Ace Inhibitors Cough   Clopidogrel Bisulfate Other (See Comments)   Sick, Headache, "Felt terrible"   Codeine Other (See Comments)   hallucinations   Lisinopril Cough   Penicillins Other (See Comments)   Unknown reaction Did it involve swelling of the face/tongue/throat, SOB, or low BP? Unknown Did it involve sudden or severe rash/hives, skin peeling, or any reaction on the inside of your mouth or nose? Unknown Did you need to seek medical attention at a hospital or doctor's office? Unknown When did it last happen?      childhood allergy If all above answers are "NO", may proceed with cephalosporin use.        Medication List        Accurate as of June 08, 2021 11:59 PM. If you have any questions, ask your nurse or doctor.          acetaminophen 500 MG tablet Commonly known as: TYLENOL Take 1,000 mg by mouth every 6 (six) hours as needed for moderate pain or headache.   albuterol (2.5 MG/3ML) 0.083% nebulizer solution Commonly known as: PROVENTIL Take 3 mLs (2.5 mg total) by nebulization every 6 (six) hours as needed for  wheezing or shortness of breath.   albuterol 108 (90 Base) MCG/ACT inhaler Commonly known as: VENTOLIN HFA Inhale 2 puffs into the lungs every 6 (six) hours as needed for wheezing or shortness of breath.   aspirin 81 MG EC tablet Take 81 mg by mouth daily.   atenolol 50 MG tablet Commonly known as: TENORMIN Take 1 tablet by mouth twice daily   Basaglar KwikPen 100 UNIT/ML Inject 20 Units into the skin at bedtime. Add five units each time the fasting is over 125 three days in a row.   Breztri Aerosphere 160-9-4.8 MCG/ACT Aero Generic drug: Budeson-Glycopyrrol-Formoterol Inhale 2 puffs into the lungs in the morning and at bedtime. PLEASE FILL 3 MONTH SUPPLY WITH 4 REFILLS   busPIRone 7.5 MG tablet Commonly known as:  BUSPAR Take 1 tablet (7.5 mg total) by mouth 3 (three) times daily as needed.   calcium acetate 667 MG capsule Commonly known as: PHOSLO Take 667-1,334 mg by mouth See admin instructions. Take 1334 mg with meals 3 times daily and 667 mg with snacks   cetirizine 10 MG tablet Commonly known as: ZYRTEC Take 1 tablet (10 mg total) by mouth daily as needed for allergies.   DULoxetine 20 MG capsule Commonly known as: CYMBALTA Take 1 capsule (20 mg total) by mouth daily.   gabapentin 400 MG capsule Commonly known as: NEURONTIN Take 1 capsule (400 mg total) by mouth See admin instructions. Take it after HD   LUBRICATING EYE DROPS OP Place 1 drop into both eyes daily as needed (dry eyes).   montelukast 10 MG tablet Commonly known as: SINGULAIR Take 1 tablet (10 mg total) by mouth at bedtime.   NIFEdipine 60 MG 24 hr tablet Commonly known as: PROCARDIA XL/NIFEDICAL XL Take 60 mg by mouth daily.   nitroGLYCERIN 0.4 MG SL tablet Commonly known as: NITROSTAT Place 1 tablet (0.4 mg total) under the tongue every 5 (five) minutes as needed for chest pain.   ondansetron 4 MG tablet Commonly known as: ZOFRAN TAKE 1 TABLET BY MOUTH EVERY 8 HOURS AS NEEDED FOR NAUSEA AND VOMITING   OneTouch Ultra test strip Generic drug: glucose blood Use to test blood sugar 3 times daily as directed. DX: E11.9   polyethylene glycol powder 17 GM/SCOOP powder Commonly known as: GLYCOLAX/MIRALAX Take 17 g by mouth 2 (two) times daily as needed for moderate constipation. For constipation   QUEtiapine 50 MG tablet Commonly known as: SEROQUEL Take 1 tablet (50 mg total) by mouth at bedtime. For sleep and for anxiety   rOPINIRole 1 MG tablet Commonly known as: REQUIP Take 1 tablet (1 mg total) by mouth at bedtime. For leg cramps   rosuvastatin 5 MG tablet Commonly known as: Crestor Take 1 tablet (5 mg total) by mouth daily. For cholesterol         Follow-up: Return in about 3 months (around  09/08/2021).  [name], M.D.

## 2021-06-09 NOTE — Patient Instructions (Signed)

## 2021-06-09 NOTE — Progress Notes (Signed)
Subjective:    Patient ID: [name], female    DOB: 10/07/1951, 71 y.o.   MRN: [id number]  Chief Complaint  Patient presents with   Gynecologic Exam   Pt presents to the office today for pap. She is followed by Dr. Livia Baker, but wants a female provider.   She reports she has end stage renal disease and is on dialysis on Tuesday, Thursday, and Saturday. She is in the process of getting on the transplant list, but needs a pap, colonoscopy to get on the list. She reports she had a partial hysterectomy about 40 years ago.  Gynecologic Exam [name] primary symptoms include genital itching. [name] pertinent negatives include no vaginal bleeding. This is a chronic problem.  Hypertension This is a chronic problem. The current episode started more than 1 year ago. The problem has been resolved since onset. The problem is controlled. Associated symptoms include malaise/fatigue. Pertinent negatives include no peripheral edema or shortness of breath. Risk factors for coronary artery disease include dyslipidemia and obesity. The current treatment provides moderate improvement.     Review of Systems  Constitutional:  Positive for malaise/fatigue.  Respiratory:  Negative for shortness of breath.    Family History  Problem Relation Age of Onset   Heart disease Other    Hypertension Father    Cancer Father    Hypertension Mother    Hypertension Daughter    Pancreatitis Brother    Hypertension Daughter    Diabetes Daughter    Social History   Socioeconomic History   Marital status: Widowed    Spouse name: Not on file   Number of children: 2   Years of education: GED   Highest education level: GED or equivalent  Occupational History   Occupation: Disabled  Tobacco Use   Smoking status: Former    Packs/day: 1.00    Years: 30.00    Pack years: 30.00    Types: Cigarettes    Quit date: 12/04/2001    Years since quitting: 19.5   Smokeless tobacco: Never  Vaping Use    Vaping Use: Never used  Substance and Sexual Activity   Alcohol use: No    Alcohol/week: 6.0 standard drinks    Types: 6 Cans of beer per week   Drug use: No   Sexual activity: Not Currently    Birth control/protection: Surgical  Other Topics Concern   Not on file  Social History Narrative   Lives in [place] with mother   Social Determinants of Health   Financial Resource Strain: Low Risk    Difficulty of Paying Living Expenses: Not hard at all  Food Insecurity: Not on file  Transportation Needs: No Transportation Needs   Lack of Transportation (Medical): No   Lack of Transportation (Non-Medical): No  Physical Activity: Not on file  Stress: No Stress Concern Present   Feeling of Stress : Not at all  Social Connections: Not on file       Objective:   Physical Exam Vitals reviewed.  Constitutional:      General: She is not in acute distress.    Appearance: She is well-developed. She is obese.  HENT:     Head: Normocephalic and atraumatic.     Right Ear: Tympanic membrane normal.     Left Ear: Tympanic membrane normal.  Eyes:     Pupils: Pupils are equal, round, and reactive to light.  Neck:     Thyroid: No thyromegaly.  Cardiovascular:  Rate and Rhythm: Normal rate and regular rhythm.     Heart sounds: Normal heart sounds. No murmur heard. Pulmonary:     Effort: Pulmonary effort is normal. No respiratory distress.     Breath sounds: Normal breath sounds. No wheezing.  Abdominal:     General: Bowel sounds are normal. There is no distension.     Palpations: Abdomen is soft.     Tenderness: There is no abdominal tenderness.  Genitourinary:    Comments: Bimanual exam- no adnexal masses or tenderness, ovaries nonpalpable   Cervix not present- No discharge  Musculoskeletal:        General: No tenderness. Normal range of motion.     Cervical back: Normal range of motion and neck supple.     Right lower leg: Edema (trace) present.  Skin:    General: Skin is  warm and dry.  Neurological:     Mental Status: She is alert and oriented to person, place, and time.     Cranial Nerves: No cranial nerve deficit.     Deep Tendon Reflexes: Reflexes are normal and symmetric.  Psychiatric:        Behavior: Behavior normal.        Thought Content: Thought content normal.        Judgment: Judgment normal.      BP 136/67   Pulse 82   Temp (!) 97.2 F (36.2 C) (Temporal)   Ht '5\' 3"'$  (1.6 m)   Wt 192 lb (87.1 kg)   BMI 34.01 kg/m      Assessment & Plan:  [name] comes in today with chief complaint of Gynecologic Exam   Diagnosis and orders addressed:  1. Gynecologic exam normal  2. End stage renal disease ([place])  3. Dependence on renal dialysis (HCC)  4. Class 1 obesity with serious comorbidity and body mass index (BMI) of 34.0 to 34.9 in adult, unspecified obesity type  5. Primary hypertension   Health Maintenance reviewed Diet and exercise encouraged  Follow up plan: Keep follow up with PCP   Evelina Dun, FNP

## 2021-06-14 LAB — CYTOLOGY - PAP: Diagnosis: NEGATIVE

## 2021-06-16 ENCOUNTER — Other Ambulatory Visit: Payer: Self-pay | Admitting: Family

## 2021-06-16 MED ORDER — FLUCONAZOLE 150 MG PO TABS: 150.0000 mg | ORAL_TABLET | ORAL | 0 refills | Status: DC | PRN

## 2021-06-26 DIAGNOSIS — Z23 Encounter for immunization: Secondary | ICD-10-CM | POA: Diagnosis not present

## 2021-06-27 DIAGNOSIS — M47816 Spondylosis without myelopathy or radiculopathy, lumbar region: Secondary | ICD-10-CM | POA: Diagnosis not present

## 2021-06-28 DIAGNOSIS — Z992 Dependence on renal dialysis: Secondary | ICD-10-CM | POA: Diagnosis not present

## 2021-06-29 DIAGNOSIS — R194 Change in bowel habit: Secondary | ICD-10-CM | POA: Diagnosis not present

## 2021-06-29 DIAGNOSIS — Z8371 Family history of colonic polyps: Secondary | ICD-10-CM | POA: Diagnosis not present

## 2021-07-03 DIAGNOSIS — N186 End stage renal disease: Secondary | ICD-10-CM | POA: Diagnosis not present

## 2021-07-03 DIAGNOSIS — Z992 Dependence on renal dialysis: Secondary | ICD-10-CM | POA: Diagnosis not present

## 2021-07-05 DIAGNOSIS — Z992 Dependence on renal dialysis: Secondary | ICD-10-CM | POA: Diagnosis not present

## 2021-07-05 DIAGNOSIS — N186 End stage renal disease: Secondary | ICD-10-CM | POA: Diagnosis not present

## 2021-07-05 DIAGNOSIS — D509 Iron deficiency anemia, unspecified: Secondary | ICD-10-CM | POA: Diagnosis not present

## 2021-07-05 DIAGNOSIS — N2581 Secondary hyperparathyroidism of renal origin: Secondary | ICD-10-CM | POA: Diagnosis not present

## 2021-07-05 DIAGNOSIS — D631 Anemia in chronic kidney disease: Secondary | ICD-10-CM | POA: Diagnosis not present

## 2021-07-20 ENCOUNTER — Ambulatory Visit: Payer: Medicare Other | Admitting: *Deleted

## 2021-07-21 DIAGNOSIS — M5440 Lumbago with sciatica, unspecified side: Secondary | ICD-10-CM | POA: Insufficient documentation

## 2021-07-26 DIAGNOSIS — Z992 Dependence on renal dialysis: Secondary | ICD-10-CM | POA: Diagnosis not present

## 2021-07-26 DIAGNOSIS — M5416 Radiculopathy, lumbar region: Secondary | ICD-10-CM | POA: Diagnosis not present

## 2021-07-29 DIAGNOSIS — N186 End stage renal disease: Secondary | ICD-10-CM | POA: Diagnosis not present

## 2021-07-29 DIAGNOSIS — Z01818 Encounter for other preprocedural examination: Secondary | ICD-10-CM | POA: Diagnosis not present

## 2021-07-29 DIAGNOSIS — R944 Abnormal results of kidney function studies: Secondary | ICD-10-CM | POA: Diagnosis not present

## 2021-07-29 DIAGNOSIS — N200 Calculus of kidney: Secondary | ICD-10-CM | POA: Diagnosis not present

## 2021-07-29 DIAGNOSIS — K5730 Diverticulosis of large intestine without perforation or abscess without bleeding: Secondary | ICD-10-CM | POA: Diagnosis not present

## 2021-08-03 DIAGNOSIS — N186 End stage renal disease: Secondary | ICD-10-CM | POA: Diagnosis not present

## 2021-08-03 DIAGNOSIS — Z992 Dependence on renal dialysis: Secondary | ICD-10-CM | POA: Diagnosis not present

## 2021-08-04 DIAGNOSIS — D631 Anemia in chronic kidney disease: Secondary | ICD-10-CM | POA: Diagnosis not present

## 2021-08-04 DIAGNOSIS — N186 End stage renal disease: Secondary | ICD-10-CM | POA: Diagnosis not present

## 2021-08-04 DIAGNOSIS — N2581 Secondary hyperparathyroidism of renal origin: Secondary | ICD-10-CM | POA: Diagnosis not present

## 2021-08-04 DIAGNOSIS — D509 Iron deficiency anemia, unspecified: Secondary | ICD-10-CM | POA: Diagnosis not present

## 2021-08-04 DIAGNOSIS — Z992 Dependence on renal dialysis: Secondary | ICD-10-CM | POA: Diagnosis not present

## 2021-08-24 ENCOUNTER — Other Ambulatory Visit: Payer: Medicare Other

## 2021-08-24 ENCOUNTER — Ambulatory Visit (HOSPITAL_COMMUNITY): Admission: RE | Admit: 2021-08-24 | Payer: Medicare Other | Source: Ambulatory Visit

## 2021-08-26 ENCOUNTER — Encounter: Payer: Self-pay | Admitting: Nurse Practitioner

## 2021-08-26 ENCOUNTER — Other Ambulatory Visit: Payer: Self-pay

## 2021-08-26 ENCOUNTER — Ambulatory Visit (INDEPENDENT_AMBULATORY_CARE_PROVIDER_SITE_OTHER): Payer: Medicare Other | Admitting: Nurse Practitioner

## 2021-08-26 VITALS — BP 127/72 | HR 80 | Temp 97.1°F | Ht 63.0 in | Wt 190.0 lb

## 2021-08-26 DIAGNOSIS — R110 Nausea: Secondary | ICD-10-CM

## 2021-08-26 MED ORDER — OMEPRAZOLE 20 MG PO CPDR: 20.0000 mg | DELAYED_RELEASE_CAPSULE | Freq: Every day | ORAL | 3 refills | Status: DC

## 2021-08-26 NOTE — Progress Notes (Signed)
Acute Office Visit  Subjective:    Patient ID: [name], female    DOB: 1951-03-26, 70 y.o.   MRN: [id number]  Chief Complaint  Patient presents with   Nausea    HPI Patient is in today for nausea symptoms in the past 2 weeks unresolved.  No fever, headache, vomiting, or abdominal pain associated with symptoms.  Past Medical History:  Diagnosis Date   Acute on chronic diastolic CHF (congestive heart failure) ([place]) 03/05/2015   Grade 2. EF 60-65%   Anemia    hx of   Anxiety    Arthritis    CAD (coronary artery disease)    s/p Stenting in 2005;  [place] 7/09: Vigorous LV function, 2-3+ MR, RI 40%, RI stent patent, proximal-mid RCA 40-50%;  Echo 7/09:  EF 55-65%, trivial MR;  My[facility] 11/18/12: EF 66%, normal LV wall motion, mild to moderate anterior ischemia - reviewed by [facility] and felt to rep breast atten (low risk);  Echo 6/14: mild LVH, EF 55-65%, Gr 2 DD, PASP 44, trivial eff    Chronic kidney disease    CREATININE IS UP--GOING TO KIDNEY MD    Chronic neck pain    COPD (chronic obstructive pulmonary disease) (HCC)    Degenerative joint disease    left shoulder   Diabetic neuropathy (HCC)    Diverticulosis    Esophageal stricture    GERD (gastroesophageal reflux disease)    Hyperlipidemia    Hypertension    IDDM (insulin dependent diabetes mellitus)    Pericardial effusion 03/06/2015   Very small per echo ; pleural nodules seen on CT chest.   Peripheral vascular disease ([place])    Stroke ([place])    "mini- years ago"   Vitamin D deficiency     Past Surgical History:  Procedure Laterality Date   A/V FISTULAGRAM N/A 08/10/2017   Procedure: A/V Fistulagram - Right Arm;  Surgeon: Elam Dutch, MD;  Location: [place] [facility];  Service: Cardiovascular;  Laterality: N/A;   A/V FISTULAGRAM N/A 03/03/2020   Procedure: A/V FISTULAGRAM - Right Arm;  Surgeon: Marty Heck, MD;  Location: [place] [facility];  Service: Cardiovascular;  Laterality: N/A;   ABDOMINAL  HYSTERECTOMY     AV FISTULA PLACEMENT Right 07/20/2015   Procedure: RADIAL CEPHALIC ARTERIOVENOUS FISTULA CREATION RIGHT ARM;  Surgeon: Angelia Mould, MD;  Location: [place];  Service: Vascular;  Laterality: Right;   AV FISTULA PLACEMENT Right 11/26/2015   Procedure:  Creation of RIGHT BRACHIO-CEPHALIC arteriovenous fistula;  Surgeon: Angelia Mould, MD;  Location: [facility];  Service: Vascular;  Laterality: Right;   BACK SURGERY     2007 & 2013   [place]   CARDIAC CATHETERIZATION  2003, 2005, 2009   1 stent   cardiac stents     CATARACT EXTRACTION W/PHACO Right 05/04/2014   Procedure: CATARACT EXTRACTION PHACO AND INTRAOCULAR LENS PLACEMENT ([place]);  Surgeon: Williams Che, MD;  Location: [facility];  Service: Ophthalmology;  Laterality: Right;  CDE 1.47   CATARACT EXTRACTION W/PHACO Left 07/20/2014   Procedure: CATARACT EXTRACTION WITH PHACO AND INTRAOCULAR LENS PLACEMENT LEFT EYE CDE=1.79;  Surgeon: Williams Che, MD;  Location: [facility];  Service: Ophthalmology;  Laterality: Left;   CERVICAL SPINE SURGERY  2012   8 screws   CYSTOSCOPY WITH INJECTION N/A 06/03/2013   Procedure: MACROPLASTIQUE  INJECTION ;  Surgeon: Malka So, MD;  Location: [facility];  Service: Urology;  Laterality: N/A;   FISTULOGRAM Right 10/20/2016   Procedure: FISTULOGRAM WITH POSSIBLE INTERVENTION;  Surgeon: [name], MD;  Location: [facility];  Service: Vascular;  Laterality: Right;   INSERTION OF DIALYSIS CATHETER N/A 11/26/2015   Procedure: INSERTION OF DIALYSIS CATHETER;  Surgeon: Angelia Mould, MD;  Location: [place];  Service: Vascular;  Laterality: N/A;   LAMINECTOMY  12/29/2011   LUMBAR LAMINECTOMY/DECOMPRESSION MICRODISCECTOMY  12/29/2011   Procedure: LUMBAR LAMINECTOMY/DECOMPRESSION MICRODISCECTOMY;  Surgeon: Floyce Stakes, MD;  Location: [place] [facility];  Service: Neurosurgery;  Laterality: N/A;  Lumbar Two through Lumbar Five Laminectomies/Cellsaver   PARTIAL HYSTERECTOMY      PERIPHERAL VASCULAR BALLOON ANGIOPLASTY  03/03/2020   Procedure: PERIPHERAL VASCULAR BALLOON ANGIOPLASTY;  Surgeon: Marty Heck, MD;  Location: [place] [facility];  Service: Cardiovascular;;  Rt arm fistula   PERIPHERAL VASCULAR BALLOON ANGIOPLASTY Right 04/15/2021   Procedure: PERIPHERAL VASCULAR BALLOON ANGIOPLASTY;  Surgeon: Angelia Mould, MD;  Location: [place] [facility];  Service: Cardiovascular;  Laterality: Right;  arm fistula   PERIPHERAL VASCULAR CATHETERIZATION N/A 11/22/2015   Procedure: Fistulagram;  Surgeon: Angelia Mould, MD;  Location: [place] [facility];  Service: Cardiovascular;  Laterality: N/A;   PERIPHERAL VASCULAR INTERVENTION  08/10/2017   Procedure: PERIPHERAL VASCULAR INTERVENTION;  Surgeon: Elam Dutch, MD;  Location: [place] [facility];  Service: Cardiovascular;;   SHOULDER SURGERY Right    Rotator cuff    Family History  Problem Relation Age of Onset   Heart disease Other    Hypertension Father    Cancer Father    Hypertension Mother    Hypertension Daughter    Pancreatitis Brother    Hypertension Daughter    Diabetes Daughter     Social History   Socioeconomic History   Marital status: Widowed    Spouse name: Not on file   Number of children: 2   Years of education: GED   Highest education level: GED or equivalent  Occupational History   Occupation: Disabled  Tobacco Use   Smoking status: Former    Packs/day: 1.00    Years: 30.00    Pack years: 30.00    Types: Cigarettes    Quit date: 12/04/2001    Years since quitting: 19.7   Smokeless tobacco: Never  Vaping Use   Vaping Use: Never used  Substance and Sexual Activity   Alcohol use: No    Alcohol/week: 6.0 standard drinks    Types: 6 Cans of beer per week   Drug use: No   Sexual activity: Not Currently    Birth control/protection: Surgical  Other Topics Concern   Not on file  Social History Narrative   Lives in [place] with mother   Social Determinants  of Health   Financial Resource Strain: Low Risk    Difficulty of Paying Living Expenses: Not hard at all  Food Insecurity: Not on file  Transportation Needs: No Transportation Needs   Lack of Transportation (Medical): No   Lack of Transportation (Non-Medical): No  Physical Activity: Not on file  Stress: No Stress Concern Present   Feeling of Stress : Not at all  Social Connections: Not on file  Intimate Partner Violence: Not on file    Outpatient Medications Prior to Visit  Medication Sig Dispense Refill   DULoxetine (CYMBALTA) 20 MG capsule Take by mouth.     montelukast (SINGULAIR) 10 MG tablet Take by mouth.     acetaminophen (TYLENOL) 500 MG tablet Take 1,000  mg by mouth every 6 (six) hours as needed for moderate pain or headache.     albuterol (PROVENTIL) (2.5 MG/3ML) 0.083% nebulizer solution Take 3 mLs (2.5 mg total) by nebulization every 6 (six) hours as needed for wheezing or shortness of breath. 75 mL 12   albuterol (VENTOLIN HFA) 108 (90 Base) MCG/ACT inhaler Inhale 2 puffs into the lungs every 6 (six) hours as needed for wheezing or shortness of breath. 1 each 2   aspirin 81 MG EC tablet Take 81 mg by mouth daily.     atenolol (TENORMIN) 50 MG tablet Take 1 tablet by mouth twice daily 180 tablet 0   Budeson-Glycopyrrol-Formoterol (BREZTRI AEROSPHERE) 160-9-4.8 MCG/ACT AERO Inhale 2 puffs into the lungs in the morning and at bedtime. PLEASE FILL 3 MONTH SUPPLY WITH 4 REFILLS 5.9 g 11   busPIRone (BUSPAR) 7.5 MG tablet Take 1 tablet (7.5 mg total) by mouth 3 (three) times daily as needed. 90 tablet 1   calcium acetate (PHOSLO) 667 MG capsule Take 667-1,334 mg by mouth See admin instructions. Take 1334 mg with meals 3 times daily and 667 mg with snacks     Carboxymethylcellul-Glycerin (LUBRICATING EYE DROPS OP) Place 1 drop into both eyes daily as needed (dry eyes).     cetirizine (ZYRTEC) 10 MG tablet Take 1 tablet (10 mg total) by mouth daily as needed for allergies. 90 tablet  3   fluconazole (DIFLUCAN) 150 MG tablet Take 1 tablet (150 mg total) by mouth every three (3) days as needed. 3 tablet 0   gabapentin (NEURONTIN) 400 MG capsule Take 1 capsule (400 mg total) by mouth See admin instructions. Take it after HD 30 capsule 1   glucose blood (ONETOUCH ULTRA) test strip Use to test blood sugar 3 times daily as directed. DX: E11.9 300 each 12   Insulin Glargine (BASAGLAR KWIKPEN) 100 UNIT/ML Inject 20 Units into the skin at bedtime. Add five units each time the fasting is over 125 three days in a row. 30 mL 1   NIFEdipine (PROCARDIA XL/NIFEDICAL XL) 60 MG 24 hr tablet Take 60 mg by mouth daily.     nitroGLYCERIN (NITROSTAT) 0.4 MG SL tablet Place 1 tablet (0.4 mg total) under the tongue every 5 (five) minutes as needed for chest pain. 25 tablet 11   ondansetron (ZOFRAN) 4 MG tablet TAKE 1 TABLET BY MOUTH EVERY 8 HOURS AS NEEDED FOR NAUSEA AND VOMITING 20 tablet 0   polyethylene glycol powder (GLYCOLAX/MIRALAX) 17 GM/SCOOP powder Take 17 g by mouth 2 (two) times daily as needed for moderate constipation. For constipation 3350 g 5   QUEtiapine (SEROQUEL) 50 MG tablet Take 1 tablet (50 mg total) by mouth at bedtime. For sleep and for anxiety 90 tablet 3   rOPINIRole (REQUIP) 1 MG tablet Take 1 tablet (1 mg total) by mouth at bedtime. For leg cramps 30 tablet 5   rosuvastatin (CRESTOR) 5 MG tablet Take 1 tablet (5 mg total) by mouth daily. For cholesterol 90 tablet 3   DULoxetine (CYMBALTA) 20 MG capsule Take 1 capsule (20 mg total) by mouth daily. 30 capsule 3   montelukast (SINGULAIR) 10 MG tablet Take 1 tablet (10 mg total) by mouth at bedtime. 30 tablet 11   Facility-Administered Medications Prior to Visit  Medication Dose Route Frequency Provider Last Rate Last Admin   0.9 %  sodium chloride infusion  250 mL Intravenous PRN Angelia Mould, MD       sodium chloride flush (NS) 0.9 %  injection 3 mL  3 mL Intravenous Q12H Angelia Mould, MD       sodium  chloride flush (NS) 0.9 % injection 3 mL  3 mL Intravenous PRN Angelia Mould, MD        Allergies  Allergen Reactions   Azithromycin     Prolonged QT   Ace Inhibitors Cough   Clopidogrel Bisulfate Other (See Comments)    Sick, Headache, "Felt terrible"   Codeine Other (See Comments)    hallucinations   Lisinopril Cough   Penicillins Other (See Comments)    Unknown reaction Did it involve swelling of the face/tongue/throat, SOB, or low BP? Unknown Did it involve sudden or severe rash/hives, skin peeling, or any reaction on the inside of your mouth or nose? Unknown Did you need to seek medical attention at a hospital or doctor's office? Unknown When did it last happen?      childhood allergy If all above answers are "NO", may proceed with cephalosporin use.      Review of Systems  Constitutional: Negative.  Negative for appetite change, chills and fever.  HENT: Negative.    Respiratory: Negative.    Gastrointestinal:  Positive for nausea. Negative for abdominal distention, abdominal pain and vomiting.  Genitourinary: Negative.   All other systems reviewed and are negative.     Objective:    Physical Exam Vitals and nursing note reviewed.  Constitutional:      Appearance: Normal appearance.  HENT:     Head: Normocephalic.     Nose: Nose normal.  Cardiovascular:     Rate and Rhythm: Normal rate and regular rhythm.  Pulmonary:     Effort: Pulmonary effort is normal.     Breath sounds: Normal breath sounds.  Abdominal:     General: Bowel sounds are normal.     Tenderness: There is no abdominal tenderness. There is no right CVA tenderness, left CVA tenderness or guarding.  Skin:    General: Skin is warm.     Findings: No rash.  Neurological:     Mental Status: She is alert and oriented to person, place, and time.  Psychiatric:        Behavior: Behavior normal.    BP 127/72   Pulse 80   Temp (!) 97.1 F (36.2 C) (Temporal)   Ht _0  (1.6 m)   Wt 190  lb (86.2 kg)   SpO2 94%   BMI 33.66 kg/m  Wt Readings from Last 3 Encounters:  08/26/21 190 lb (86.2 kg)  06/09/21 192 lb (87.1 kg)  06/08/21 195 lb 12.8 oz (88.8 kg)    Health Maintenance Due  Topic Date Due   COVID-19 Vaccine (4 - Booster for Moderna series) 01/26/2021   INFLUENZA VACCINE  07/04/2021   MAMMOGRAM  08/10/2021   TETANUS/TDAP  08/23/2021   OPHTHALMOLOGY EXAM  08/04/2021   HEMOGLOBIN A1C  08/23/2021    There are no preventive care reminders to display for this patient.   Lab Results  Component Value Date   TSH 2.450 03/07/2021   Lab Results  Component Value Date   WBC 11.5 (H) 04/11/2021   HGB 10.2 (L) 04/15/2021   HCT 30.0 (L) 04/15/2021   MCV 91.5 04/11/2021   PLT 258.0 04/11/2021   Lab Results  Component Value Date   NA 137 04/15/2021   K 3.8 04/15/2021   CO2 27 02/19/2021   GLUCOSE 291 (H) 04/15/2021   BUN 46 (H) 04/15/2021   CREATININE 8.60 (H) 04/15/2021  BILITOT 0.2 (L) 02/19/2021   ALKPHOS 87 02/19/2021   AST 17 02/19/2021   ALT 21 02/19/2021   PROT 6.8 02/19/2021   ALBUMIN 3.3 (L) 02/19/2021   CALCIUM 9.3 02/19/2021   ANIONGAP 15 02/19/2021   EGFR 4 (L) 01/31/2021   GFR 26.54 (L) 05/09/2013   Lab Results  Component Value Date   CHOL 203 (H) 01/31/2021   Lab Results  Component Value Date   HDL 36 (L) 01/31/2021   Lab Results  Component Value Date   LDLCALC 124 (H) 01/31/2021   Lab Results  Component Value Date   TRIG 240 (H) 01/31/2021   Lab Results  Component Value Date   CHOLHDL 5.6 (H) 01/31/2021   Lab Results  Component Value Date   HGBA1C 7.9 (H) 02/20/2021       Assessment & Plan:   Problem List Items Addressed This Visit       Other   Nausea - Primary    -Increase hydration -Take medication as prescribed -Avoid constipation -Avoid fried and fatty foods. -Brat diet recommended -Continue Zofran as needed -Omeprazole 20 mg tablet by mouth -Education provided to patient printed handouts  given Follow-up with worsening unresolved symptoms.      Relevant Medications   omeprazole (PRILOSEC) 20 MG capsule     Meds ordered this encounter  Medications   omeprazole (PRILOSEC) 20 MG capsule    Sig: Take 1 capsule (20 mg total) by mouth daily.    Dispense:  30 capsule    Refill:  3    Order Specific Question:   Supervising Provider    Answer:   Janora Norlander [[id number]]     Ivy Lynn, NP

## 2021-08-26 NOTE — Patient Instructions (Signed)
Nausea, Adult Nausea is the feeling that you have an upset stomach or that you are about to vomit. Nausea on its own is not usually a serious concern, but it may be an early sign of a more serious medical problem. As nausea gets worse, it can lead to vomiting. If vomiting develops, or if you are not able to drink enough fluids, you are at risk of becoming dehydrated. Dehydration can make you tired and thirsty, cause you to have a dry mouth, and decrease how often you urinate. Older adults and people with other diseases or a weak disease-fighting system (immune system) are at higher risk for dehydration. The main goals of treating your nausea are: To relieve your nausea. To limit repeated nausea episodes. To prevent vomiting and dehydration. Follow these instructions at home: Watch your symptoms for any changes. Tell your health care provider about them. Follow these instructions as told by your health care provider. Eating and drinking   Take an oral rehydration solution (ORS). This is a drink that is sold at pharmacies and retail stores. Drink clear fluids slowly and in small amounts as you are able. Clear fluids include water, ice chips, low-calorie sports drinks, and fruit juice that has water added (diluted fruit juice). Eat bland, easy-to-digest foods in small amounts as you are able. These foods include bananas, applesauce, rice, lean meats, toast, and crackers. Avoid drinking fluids that contain a lot of sugar or caffeine, such as energy drinks, sports drinks, and soda. Avoid alcohol. Avoid spicy or fatty foods. General instructions Take over-the-counter and prescription medicines only as told by your health care provider. Rest at home while you recover. Drink enough fluid to keep your urine pale yellow. Breathe slowly and deeply when you feel nauseous. Avoid smelling things that have strong odors. Wash your hands often using soap and water. If soap and water are not available, use hand  sanitizer. Make sure that all people in your household wash their hands well and often. Keep all follow-up visits as told by your health care provider. This is important. Contact a health care provider if: Your nausea gets worse. Your nausea does not go away after two days. You vomit. You cannot drink fluids without vomiting. You have any of the following: New symptoms. A fever. A headache. Muscle cramps. A rash. Pain while urinating. You feel light-headed or dizzy. Get help right away if: You have pain in your chest, neck, arm, or jaw. You feel extremely weak or you faint. You have vomit that is bright red or looks like coffee grounds. You have bloody or black stools or stools that look like tar. You have a severe headache, a stiff neck, or both. You have severe pain, cramping, or bloating in your abdomen. You have difficulty breathing or are breathing very quickly. Your heart is beating very quickly. Your skin feels cold and clammy. You feel confused. You have signs of dehydration, such as: Dark urine, very little urine, or no urine. Cracked lips. Dry mouth. Sunken eyes. Sleepiness. Weakness. These symptoms may represent a serious problem that is an emergency. Do not wait to see if the symptoms will go away. Get medical help right away. Call your local emergency services (911 in the [place] Do not drive yourself to the hospital. Summary Nausea is the feeling that you have an upset stomach or that you are about to vomit. Nausea on its own is not usually a serious concern, but it may be an early sign of a more  serious medical problem. If vomiting develops, or if you are not able to drink enough fluids, you are at risk of becoming dehydrated. Follow recommendations for eating and drinking and take over-the-counter and prescription medicines only as told by your health care provider. Contact a health care provider right away if your symptoms worsen or you have new symptoms. Keep  all follow-up visits as told by your health care provider. This is important. This information is not intended to replace advice given to you by your health care provider. Make sure you discuss any questions you have with your health care provider. Document Revised: 02/09/2021 Document Reviewed: 04/30/2018 [employer] Patient Education  [employer].

## 2021-08-26 NOTE — Assessment & Plan Note (Signed)
-  Increase hydration -Take medication as prescribed -Avoid constipation -Avoid fried and fatty foods. -Brat diet recommended -Continue Zofran as needed -Omeprazole 20 mg tablet by mouth -Education provided to patient printed handouts given Follow-up with worsening unresolved symptoms.

## 2021-09-02 DIAGNOSIS — N186 End stage renal disease: Secondary | ICD-10-CM | POA: Diagnosis not present

## 2021-09-02 DIAGNOSIS — Z992 Dependence on renal dialysis: Secondary | ICD-10-CM | POA: Diagnosis not present

## 2021-09-03 DIAGNOSIS — N2581 Secondary hyperparathyroidism of renal origin: Secondary | ICD-10-CM | POA: Diagnosis not present

## 2021-09-03 DIAGNOSIS — Z23 Encounter for immunization: Secondary | ICD-10-CM | POA: Diagnosis not present

## 2021-09-03 DIAGNOSIS — D509 Iron deficiency anemia, unspecified: Secondary | ICD-10-CM | POA: Diagnosis not present

## 2021-09-03 DIAGNOSIS — Z992 Dependence on renal dialysis: Secondary | ICD-10-CM | POA: Diagnosis not present

## 2021-09-03 DIAGNOSIS — N186 End stage renal disease: Secondary | ICD-10-CM | POA: Diagnosis not present

## 2021-09-03 DIAGNOSIS — D631 Anemia in chronic kidney disease: Secondary | ICD-10-CM | POA: Diagnosis not present

## 2021-09-06 DIAGNOSIS — N186 End stage renal disease: Secondary | ICD-10-CM | POA: Diagnosis not present

## 2021-09-06 DIAGNOSIS — Z992 Dependence on renal dialysis: Secondary | ICD-10-CM | POA: Diagnosis not present

## 2021-09-06 DIAGNOSIS — N2581 Secondary hyperparathyroidism of renal origin: Secondary | ICD-10-CM | POA: Diagnosis not present

## 2021-09-06 DIAGNOSIS — D631 Anemia in chronic kidney disease: Secondary | ICD-10-CM | POA: Diagnosis not present

## 2021-09-06 DIAGNOSIS — Z23 Encounter for immunization: Secondary | ICD-10-CM | POA: Diagnosis not present

## 2021-09-06 DIAGNOSIS — D509 Iron deficiency anemia, unspecified: Secondary | ICD-10-CM | POA: Diagnosis not present

## 2021-09-08 DIAGNOSIS — D509 Iron deficiency anemia, unspecified: Secondary | ICD-10-CM | POA: Diagnosis not present

## 2021-09-08 DIAGNOSIS — Z992 Dependence on renal dialysis: Secondary | ICD-10-CM | POA: Diagnosis not present

## 2021-09-08 DIAGNOSIS — Z794 Long term (current) use of insulin: Secondary | ICD-10-CM | POA: Diagnosis not present

## 2021-09-08 DIAGNOSIS — N186 End stage renal disease: Secondary | ICD-10-CM | POA: Diagnosis not present

## 2021-09-08 DIAGNOSIS — D631 Anemia in chronic kidney disease: Secondary | ICD-10-CM | POA: Diagnosis not present

## 2021-09-08 DIAGNOSIS — Z23 Encounter for immunization: Secondary | ICD-10-CM | POA: Diagnosis not present

## 2021-09-08 DIAGNOSIS — N2581 Secondary hyperparathyroidism of renal origin: Secondary | ICD-10-CM | POA: Diagnosis not present

## 2021-09-08 DIAGNOSIS — E119 Type 2 diabetes mellitus without complications: Secondary | ICD-10-CM | POA: Diagnosis not present

## 2021-09-09 ENCOUNTER — Other Ambulatory Visit: Payer: Self-pay | Admitting: Family Medicine

## 2021-09-10 DIAGNOSIS — D631 Anemia in chronic kidney disease: Secondary | ICD-10-CM | POA: Diagnosis not present

## 2021-09-10 DIAGNOSIS — Z23 Encounter for immunization: Secondary | ICD-10-CM | POA: Diagnosis not present

## 2021-09-10 DIAGNOSIS — D509 Iron deficiency anemia, unspecified: Secondary | ICD-10-CM | POA: Diagnosis not present

## 2021-09-10 DIAGNOSIS — N186 End stage renal disease: Secondary | ICD-10-CM | POA: Diagnosis not present

## 2021-09-10 DIAGNOSIS — Z992 Dependence on renal dialysis: Secondary | ICD-10-CM | POA: Diagnosis not present

## 2021-09-10 DIAGNOSIS — N2581 Secondary hyperparathyroidism of renal origin: Secondary | ICD-10-CM | POA: Diagnosis not present

## 2021-09-12 DIAGNOSIS — U071 COVID-19: Secondary | ICD-10-CM | POA: Diagnosis not present

## 2021-09-13 ENCOUNTER — Ambulatory Visit: Payer: Medicare Other | Admitting: Family Medicine

## 2021-09-13 DIAGNOSIS — D631 Anemia in chronic kidney disease: Secondary | ICD-10-CM | POA: Diagnosis not present

## 2021-09-13 DIAGNOSIS — Z23 Encounter for immunization: Secondary | ICD-10-CM | POA: Diagnosis not present

## 2021-09-13 DIAGNOSIS — N186 End stage renal disease: Secondary | ICD-10-CM | POA: Diagnosis not present

## 2021-09-13 DIAGNOSIS — D509 Iron deficiency anemia, unspecified: Secondary | ICD-10-CM | POA: Diagnosis not present

## 2021-09-13 DIAGNOSIS — Z992 Dependence on renal dialysis: Secondary | ICD-10-CM | POA: Diagnosis not present

## 2021-09-13 DIAGNOSIS — N2581 Secondary hyperparathyroidism of renal origin: Secondary | ICD-10-CM | POA: Diagnosis not present

## 2021-09-15 ENCOUNTER — Other Ambulatory Visit: Payer: Self-pay

## 2021-09-15 ENCOUNTER — Ambulatory Visit (INDEPENDENT_AMBULATORY_CARE_PROVIDER_SITE_OTHER): Payer: Medicare Other | Admitting: Family Medicine

## 2021-09-15 ENCOUNTER — Encounter: Payer: Self-pay | Admitting: Family Medicine

## 2021-09-15 VITALS — BP 152/65 | HR 84 | Temp 97.7°F | Ht 63.0 in | Wt 192.8 lb

## 2021-09-15 DIAGNOSIS — N2581 Secondary hyperparathyroidism of renal origin: Secondary | ICD-10-CM | POA: Diagnosis not present

## 2021-09-15 DIAGNOSIS — I200 Unstable angina: Secondary | ICD-10-CM | POA: Diagnosis not present

## 2021-09-15 DIAGNOSIS — F411 Generalized anxiety disorder: Secondary | ICD-10-CM

## 2021-09-15 DIAGNOSIS — D509 Iron deficiency anemia, unspecified: Secondary | ICD-10-CM | POA: Diagnosis not present

## 2021-09-15 DIAGNOSIS — E1159 Type 2 diabetes mellitus with other circulatory complications: Secondary | ICD-10-CM

## 2021-09-15 DIAGNOSIS — E1140 Type 2 diabetes mellitus with diabetic neuropathy, unspecified: Secondary | ICD-10-CM | POA: Diagnosis not present

## 2021-09-15 DIAGNOSIS — E1169 Type 2 diabetes mellitus with other specified complication: Secondary | ICD-10-CM | POA: Diagnosis not present

## 2021-09-15 DIAGNOSIS — Z794 Long term (current) use of insulin: Secondary | ICD-10-CM

## 2021-09-15 DIAGNOSIS — N186 End stage renal disease: Secondary | ICD-10-CM

## 2021-09-15 DIAGNOSIS — I10 Essential (primary) hypertension: Secondary | ICD-10-CM

## 2021-09-15 DIAGNOSIS — E785 Hyperlipidemia, unspecified: Secondary | ICD-10-CM

## 2021-09-15 DIAGNOSIS — I152 Hypertension secondary to endocrine disorders: Secondary | ICD-10-CM

## 2021-09-15 DIAGNOSIS — E1122 Type 2 diabetes mellitus with diabetic chronic kidney disease: Secondary | ICD-10-CM

## 2021-09-15 DIAGNOSIS — Z992 Dependence on renal dialysis: Secondary | ICD-10-CM

## 2021-09-15 DIAGNOSIS — D631 Anemia in chronic kidney disease: Secondary | ICD-10-CM | POA: Diagnosis not present

## 2021-09-15 DIAGNOSIS — Z23 Encounter for immunization: Secondary | ICD-10-CM | POA: Diagnosis not present

## 2021-09-15 MED ORDER — CETIRIZINE HCL 10 MG PO TABS: 10.0000 mg | ORAL_TABLET | Freq: Every day | ORAL | 3 refills | Status: DC | PRN

## 2021-09-15 MED ORDER — FREESTYLE LIBRE 2 SENSOR MISC: 12 refills | Status: AC

## 2021-09-15 MED ORDER — ROPINIROLE HCL 1 MG PO TABS: 1.0000 mg | ORAL_TABLET | Freq: Every day | ORAL | 3 refills | Status: DC

## 2021-09-15 MED ORDER — ATENOLOL 50 MG PO TABS: 50.0000 mg | ORAL_TABLET | Freq: Two times a day (BID) | ORAL | 3 refills | Status: DC

## 2021-09-15 MED ORDER — BUSPIRONE HCL 7.5 MG PO TABS: 7.5000 mg | ORAL_TABLET | Freq: Three times a day (TID) | ORAL | 1 refills | Status: DC | PRN

## 2021-09-15 NOTE — Progress Notes (Signed)
Subjective:  Patient ID: [name],  female    DOB: 1951/08/02  Age: 70 y.o.    CC: Medical Management of Chronic Issues   HPI [name] presents for  follow-up of hypertension. Patient has no history of headache chest pain or shortness of breath or recent cough. Patient also denies symptoms of TIA such as numbness weakness lateralizing. Patient denies side effects from medication. States taking it regularly. Had too much salt last few days. BP was high at dialysis today.   Patient also  in for follow-up of elevated cholesterol. Doing well without complaints on current medication. Denies side effects  including myalgia and arthralgia and nausea. Also in today for liver function testing. Currently no chest pain, shortness of breath or other cardiovascular related symptoms noted.  Follow-up of diabetes. Patient does check blood sugar at home. Readings run between 120 and 180s prandial, occasionally over 200 if she eats something she shouldn't.  Patient denies symptoms such as excessive hunger or urinary frequency, excessive hunger, nausea No significant hypoglycemic spells noted. Medications reviewed. Pt reports taking them regularly. Pt. denies complication/adverse reaction today. A1c done on 9/28 at dialysis was 7.4.    History [name] has a past medical history of Acute on chronic diastolic CHF (congestive heart failure) ([place]) (03/05/2015), Anemia, Anxiety, Arthritis, CAD (coronary artery disease), Chronic kidney disease, Chronic neck pain, COPD (chronic obstructive pulmonary disease) ([place]), Degenerative joint disease, Diabetic neuropathy ([place]), Diverticulosis, Esophageal stricture, GERD (gastroesophageal reflux disease), Hyperlipidemia, Hypertension, IDDM (insulin dependent diabetes mellitus), Pericardial effusion (03/06/2015), Peripheral vascular disease ([place]), Stroke ([place]), and Vitamin D deficiency.   She has a past surgical history that includes Bladder surgery; Partial  hysterectomy; Shoulder surgery (Right); Cervical spine surgery (2012); Abdominal hysterectomy; Laminectomy (12/29/2011); cardiac stents; Lumbar laminectomy/decompression microdiscectomy (12/29/2011); Back surgery; Cystoscopy with injection (N/A, 06/03/2013); Cardiac catheterization (2003, 2005, 2009); Cataract extraction w/PHACO (Right, 05/04/2014); Cataract extraction w/PHACO (Left, 07/20/2014); AV fistula placement (Right, 07/20/2015); Cardiac catheterization (N/A, 11/22/2015); Insertion of dialysis catheter (N/A, 11/26/2015); AV fistula placement (Right, 11/26/2015); Fistulogram (Right, 10/20/2016); A/V Fistulagram (N/A, 08/10/2017); PERIPHERAL VASCULAR INTERVENTION (08/10/2017); A/V Fistulagram (N/A, 03/03/2020); PERIPHERAL VASCULAR BALLOON ANGIOPLASTY (03/03/2020); and PERIPHERAL VASCULAR BALLOON ANGIOPLASTY (Right, 04/15/2021).   Her family history includes Cancer in her father; Diabetes in her daughter; Heart disease in an other family member; Hypertension in her daughter, daughter, father, and mother; Pancreatitis in her brother.She reports that she quit smoking about 19 years ago. Her smoking use included cigarettes. She has a 30.00 pack-year smoking history. She has never used smokeless tobacco. She reports that she does not drink alcohol and does not use drugs.  Current Outpatient Medications on File Prior to Visit  Medication Sig Dispense Refill   acetaminophen (TYLENOL) 500 MG tablet Take 1,000 mg by mouth every 6 (six) hours as needed for moderate pain or headache.     albuterol (PROVENTIL) (2.5 MG/3ML) 0.083% nebulizer solution Take 3 mLs (2.5 mg total) by nebulization every 6 (six) hours as needed for wheezing or shortness of breath. 75 mL 12   albuterol (VENTOLIN HFA) 108 (90 Base) MCG/ACT inhaler Inhale 2 puffs into the lungs every 6 (six) hours as needed for wheezing or shortness of breath. 1 each 2   aspirin 81 MG EC tablet Take 81 mg by mouth daily.     Budeson-Glycopyrrol-Formoterol (BREZTRI  AEROSPHERE) 160-9-4.8 MCG/ACT AERO Inhale 2 puffs into the lungs in the morning and at bedtime. PLEASE FILL 3 MONTH SUPPLY WITH 4 REFILLS 5.9 g 11  calcium acetate (PHOSLO) 667 MG capsule Take 667-1,334 mg by mouth See admin instructions. Take 1334 mg with meals 3 times daily and 667 mg with snacks     Carboxymethylcellul-Glycerin (LUBRICATING EYE DROPS OP) Place 1 drop into both eyes daily as needed (dry eyes).     DULoxetine (CYMBALTA) 20 MG capsule Take by mouth.     fluconazole (DIFLUCAN) 150 MG tablet Take 1 tablet (150 mg total) by mouth every three (3) days as needed. 3 tablet 0   gabapentin (NEURONTIN) 400 MG capsule Take 1 capsule (400 mg total) by mouth See admin instructions. Take it after HD 30 capsule 1   glucose blood (ONETOUCH ULTRA) test strip Use to test blood sugar 3 times daily as directed. DX: E11.9 300 each 12   Insulin Glargine (BASAGLAR KWIKPEN) 100 UNIT/ML Inject 20 Units into the skin at bedtime. Add five units each time the fasting is over 125 three days in a row. 30 mL 1   montelukast (SINGULAIR) 10 MG tablet Take by mouth.     NIFEdipine (PROCARDIA XL/NIFEDICAL XL) 60 MG 24 hr tablet TAKE 1 TABLET BY MOUTH ONCE DAILY (ON  NON-DIALYSIS  DAYS) 90 tablet 0   nitroGLYCERIN (NITROSTAT) 0.4 MG SL tablet Place 1 tablet (0.4 mg total) under the tongue every 5 (five) minutes as needed for chest pain. 25 tablet 11   omeprazole (PRILOSEC) 20 MG capsule Take 1 capsule (20 mg total) by mouth daily. 30 capsule 3   ondansetron (ZOFRAN) 4 MG tablet TAKE 1 TABLET BY MOUTH EVERY 8 HOURS AS NEEDED FOR NAUSEA AND VOMITING 20 tablet 0   polyethylene glycol powder (GLYCOLAX/MIRALAX) 17 GM/SCOOP powder Take 17 g by mouth 2 (two) times daily as needed for moderate constipation. For constipation 3350 g 5   QUEtiapine (SEROQUEL) 50 MG tablet Take 1 tablet (50 mg total) by mouth at bedtime. For sleep and for anxiety 90 tablet 3   rosuvastatin (CRESTOR) 5 MG tablet Take 1 tablet (5 mg total) by mouth  daily. For cholesterol 90 tablet 3   Current Facility-Administered Medications on File Prior to Visit  Medication Dose Route Frequency Provider Last Rate Last Admin   0.9 %  sodium chloride infusion  250 mL Intravenous PRN Angelia Mould, MD       sodium chloride flush (NS) 0.9 % injection 3 mL  3 mL Intravenous Q12H Angelia Mould, MD       sodium chloride flush (NS) 0.9 % injection 3 mL  3 mL Intravenous PRN Angelia Mould, MD        ROS Review of Systems  Constitutional: Negative.   HENT: Negative.    Eyes:  Negative for visual disturbance.  Respiratory:  Negative for shortness of breath.   Cardiovascular:  Negative for chest pain.  Gastrointestinal:  Negative for abdominal pain.  Musculoskeletal:  Negative for arthralgias.   Objective:  BP (!) 152/65   Pulse 84   Temp 97.7 F (36.5 C)   Ht _0  (1.6 m)   Wt 192 lb 12.8 oz (87.5 kg)   SpO2 98%   BMI 34.15 kg/m   BP Readings from Last 3 Encounters:  09/15/21 (!) 152/65  08/26/21 127/72  06/09/21 136/67    Wt Readings from Last 3 Encounters:  09/15/21 192 lb 12.8 oz (87.5 kg)  08/26/21 190 lb (86.2 kg)  06/09/21 192 lb (87.1 kg)     Physical Exam Constitutional:      General: She is not in  acute distress.    Appearance: She is well-developed.  Cardiovascular:     Rate and Rhythm: Normal rate and regular rhythm.  Pulmonary:     Breath sounds: Normal breath sounds.  Musculoskeletal:        General: Normal range of motion.  Skin:    General: Skin is warm and dry.  Neurological:     Mental Status: She is alert and oriented to person, place, and time.    Diabetic Foot Exam - Simple   No data filed       Assessment & Plan:   [name] was seen today for medical management of chronic issues.  Diagnoses and all orders for this visit:  Primary hypertension -     Cancel: CMP14+EGFR  Type 2 diabetes mellitus with diabetic neuropathy, with long-term current use of insulin (HCC) -      Cancel: Bayer DCA Hb A1c Waived -     Cancel: CBC with Differential/Platelet  Hyperlipidemia associated with type 2 diabetes mellitus (HCC) -     Cancel: Lipid panel  Hypertension associated with diabetes ([place]) -     atenolol (TENORMIN) 50 MG tablet; Take 1 tablet (50 mg total) by mouth 2 (two) times daily.  GAD (generalized anxiety disorder) -     busPIRone (BUSPAR) 7.5 MG tablet; Take 1 tablet (7.5 mg total) by mouth 3 (three) times daily as needed.  End stage renal disease on dialysis due to type 2 diabetes mellitus ([place])  Dependence on renal dialysis ([place])  Other orders -     cetirizine (ZYRTEC) 10 MG tablet; Take 1 tablet (10 mg total) by mouth daily as needed for allergies. -     rOPINIRole (REQUIP) 1 MG tablet; Take 1 tablet (1 mg total) by mouth at bedtime. For leg cramps -     Continuous Blood Gluc Sensor (FREESTYLE LIBRE 2 SENSOR) MISC; Use multiple times daily to track glucose to prevent highs and lows as a complication of dialysis Dx:Z99.2, E11.59  I have changed [name]'s atenolol. I am also having her start on FreeStyle Libre 2 Sensor. Additionally, I am having her maintain her aspirin, calcium acetate, nitroGLYCERIN, acetaminophen, Carboxymethylcellul-Glycerin (LUBRICATING EYE DROPS OP), albuterol, OneTouch Ultra, albuterol, polyethylene glycol powder, Basaglar KwikPen, rosuvastatin, gabapentin, Breztri Aerosphere, ondansetron, QUEtiapine, fluconazole, DULoxetine, montelukast, omeprazole, NIFEdipine, busPIRone, cetirizine, and rOPINIRole. We will continue to administer sodium chloride flush, sodium chloride flush, and sodium chloride.  Meds ordered this encounter  Medications   atenolol (TENORMIN) 50 MG tablet    Sig: Take 1 tablet (50 mg total) by mouth 2 (two) times daily.    Dispense:  180 tablet    Refill:  3   busPIRone (BUSPAR) 7.5 MG tablet    Sig: Take 1 tablet (7.5 mg total) by mouth 3 (three) times daily as needed.    Dispense:  90 tablet     Refill:  1   cetirizine (ZYRTEC) 10 MG tablet    Sig: Take 1 tablet (10 mg total) by mouth daily as needed for allergies.    Dispense:  90 tablet    Refill:  3   rOPINIRole (REQUIP) 1 MG tablet    Sig: Take 1 tablet (1 mg total) by mouth at bedtime. For leg cramps    Dispense:  90 tablet    Refill:  3   Continuous Blood Gluc Sensor (FREESTYLE LIBRE 2 SENSOR) MISC    Sig: Use multiple times daily to track glucose to prevent highs and lows as a  complication of dialysis Dx:Z99.2, E11.59    Dispense:  2 each    Refill:  12     Follow-up: Return in about 3 months (around 12/16/2021).  [name], M.D.

## 2021-09-16 ENCOUNTER — Ambulatory Visit (HOSPITAL_COMMUNITY)
Admission: RE | Admit: 2021-09-16 | Discharge: 2021-09-16 | Disposition: A | Discharge status: Home / Self Care | Payer: Medicare Other | Source: Ambulatory Visit | Attending: Cardiology | Admitting: Cardiology

## 2021-09-16 DIAGNOSIS — I3139 Other pericardial effusion (noninflammatory): Secondary | ICD-10-CM

## 2021-09-16 LAB — ECHOCARDIOGRAM COMPLETE
Area-P 1/2: 2.91 cm2
MV M vel: 5.08 m/s
MV Peak grad: 103.2 mmHg
S' Lateral: 2.1 cm

## 2021-09-16 NOTE — Progress Notes (Signed)
*  PRELIMINARY RESULTS* Echocardiogram 2D Echocardiogram has been performed.  [name] 09/16/2021, 10:13 AM

## 2021-09-17 DIAGNOSIS — Z23 Encounter for immunization: Secondary | ICD-10-CM | POA: Diagnosis not present

## 2021-09-17 DIAGNOSIS — N186 End stage renal disease: Secondary | ICD-10-CM | POA: Diagnosis not present

## 2021-09-17 DIAGNOSIS — N2581 Secondary hyperparathyroidism of renal origin: Secondary | ICD-10-CM | POA: Diagnosis not present

## 2021-09-17 DIAGNOSIS — D631 Anemia in chronic kidney disease: Secondary | ICD-10-CM | POA: Diagnosis not present

## 2021-09-17 DIAGNOSIS — Z992 Dependence on renal dialysis: Secondary | ICD-10-CM | POA: Diagnosis not present

## 2021-09-17 DIAGNOSIS — D509 Iron deficiency anemia, unspecified: Secondary | ICD-10-CM | POA: Diagnosis not present

## 2021-09-19 DIAGNOSIS — K648 Other hemorrhoids: Secondary | ICD-10-CM | POA: Diagnosis not present

## 2021-09-19 DIAGNOSIS — R194 Change in bowel habit: Secondary | ICD-10-CM | POA: Diagnosis not present

## 2021-09-19 DIAGNOSIS — K5730 Diverticulosis of large intestine without perforation or abscess without bleeding: Secondary | ICD-10-CM | POA: Diagnosis not present

## 2021-09-20 DIAGNOSIS — N186 End stage renal disease: Secondary | ICD-10-CM | POA: Diagnosis not present

## 2021-09-20 DIAGNOSIS — Z23 Encounter for immunization: Secondary | ICD-10-CM | POA: Diagnosis not present

## 2021-09-20 DIAGNOSIS — Z992 Dependence on renal dialysis: Secondary | ICD-10-CM | POA: Diagnosis not present

## 2021-09-20 DIAGNOSIS — D631 Anemia in chronic kidney disease: Secondary | ICD-10-CM | POA: Diagnosis not present

## 2021-09-20 DIAGNOSIS — D509 Iron deficiency anemia, unspecified: Secondary | ICD-10-CM | POA: Diagnosis not present

## 2021-09-20 DIAGNOSIS — N2581 Secondary hyperparathyroidism of renal origin: Secondary | ICD-10-CM | POA: Diagnosis not present

## 2021-09-22 DIAGNOSIS — Z992 Dependence on renal dialysis: Secondary | ICD-10-CM | POA: Diagnosis not present

## 2021-09-22 DIAGNOSIS — N2581 Secondary hyperparathyroidism of renal origin: Secondary | ICD-10-CM | POA: Diagnosis not present

## 2021-09-22 DIAGNOSIS — N186 End stage renal disease: Secondary | ICD-10-CM | POA: Diagnosis not present

## 2021-09-22 DIAGNOSIS — D631 Anemia in chronic kidney disease: Secondary | ICD-10-CM | POA: Diagnosis not present

## 2021-09-22 DIAGNOSIS — D509 Iron deficiency anemia, unspecified: Secondary | ICD-10-CM | POA: Diagnosis not present

## 2021-09-22 DIAGNOSIS — Z23 Encounter for immunization: Secondary | ICD-10-CM | POA: Diagnosis not present

## 2021-09-24 DIAGNOSIS — Z23 Encounter for immunization: Secondary | ICD-10-CM | POA: Diagnosis not present

## 2021-09-24 DIAGNOSIS — D631 Anemia in chronic kidney disease: Secondary | ICD-10-CM | POA: Diagnosis not present

## 2021-09-24 DIAGNOSIS — D509 Iron deficiency anemia, unspecified: Secondary | ICD-10-CM | POA: Diagnosis not present

## 2021-09-24 DIAGNOSIS — Z992 Dependence on renal dialysis: Secondary | ICD-10-CM | POA: Diagnosis not present

## 2021-09-24 DIAGNOSIS — N2581 Secondary hyperparathyroidism of renal origin: Secondary | ICD-10-CM | POA: Diagnosis not present

## 2021-09-24 DIAGNOSIS — N186 End stage renal disease: Secondary | ICD-10-CM | POA: Diagnosis not present

## 2021-09-27 DIAGNOSIS — N2581 Secondary hyperparathyroidism of renal origin: Secondary | ICD-10-CM | POA: Diagnosis not present

## 2021-09-27 DIAGNOSIS — D509 Iron deficiency anemia, unspecified: Secondary | ICD-10-CM | POA: Diagnosis not present

## 2021-09-27 DIAGNOSIS — Z23 Encounter for immunization: Secondary | ICD-10-CM | POA: Diagnosis not present

## 2021-09-27 DIAGNOSIS — D631 Anemia in chronic kidney disease: Secondary | ICD-10-CM | POA: Diagnosis not present

## 2021-09-27 DIAGNOSIS — N186 End stage renal disease: Secondary | ICD-10-CM | POA: Diagnosis not present

## 2021-09-27 DIAGNOSIS — Z992 Dependence on renal dialysis: Secondary | ICD-10-CM | POA: Diagnosis not present

## 2021-09-28 ENCOUNTER — Telehealth: Payer: Self-pay | Admitting: Family Medicine

## 2021-09-29 DIAGNOSIS — N186 End stage renal disease: Secondary | ICD-10-CM | POA: Diagnosis not present

## 2021-09-29 DIAGNOSIS — D509 Iron deficiency anemia, unspecified: Secondary | ICD-10-CM | POA: Diagnosis not present

## 2021-09-29 DIAGNOSIS — D631 Anemia in chronic kidney disease: Secondary | ICD-10-CM | POA: Diagnosis not present

## 2021-09-29 DIAGNOSIS — Z992 Dependence on renal dialysis: Secondary | ICD-10-CM | POA: Diagnosis not present

## 2021-09-29 DIAGNOSIS — Z23 Encounter for immunization: Secondary | ICD-10-CM | POA: Diagnosis not present

## 2021-09-29 DIAGNOSIS — N2581 Secondary hyperparathyroidism of renal origin: Secondary | ICD-10-CM | POA: Diagnosis not present

## 2021-09-29 NOTE — Telephone Encounter (Signed)
R/C on message she left for [facility] yesterday.

## 2021-09-30 ENCOUNTER — Other Ambulatory Visit: Payer: Self-pay

## 2021-09-30 ENCOUNTER — Ambulatory Visit (INDEPENDENT_AMBULATORY_CARE_PROVIDER_SITE_OTHER): Payer: Medicare Other | Admitting: Pharmacist

## 2021-09-30 DIAGNOSIS — E1169 Type 2 diabetes mellitus with other specified complication: Secondary | ICD-10-CM

## 2021-09-30 DIAGNOSIS — E1165 Type 2 diabetes mellitus with hyperglycemia: Secondary | ICD-10-CM

## 2021-09-30 DIAGNOSIS — E785 Hyperlipidemia, unspecified: Secondary | ICD-10-CM

## 2021-09-30 DIAGNOSIS — Z23 Encounter for immunization: Secondary | ICD-10-CM | POA: Diagnosis not present

## 2021-09-30 DIAGNOSIS — Z794 Long term (current) use of insulin: Secondary | ICD-10-CM

## 2021-09-30 NOTE — Telephone Encounter (Signed)
APPT SCHEDULED FOR 09/30/21

## 2021-09-30 NOTE — Progress Notes (Signed)
Chronic Care Management Pharmacy Note  09/30/2021 Name:  [name] MRN:  [id number] DOB:  [birth year]  Summary: T2DM, HLD  Recommendations/Changes made from today's visit:  Diabetes: New goal. Uncontrolled--A1C 7.9%; current treatment:BASAGLAR 20 UNITS;  INTOLERANCES:  DID NOT TOLERATE TRULICITY WELL DUE TO GI (EVEN AT LOWEST DOSE) LIMITED DUE TO HD TIW WILL TRIAL OZEMPIC LOW DOSE (50% OF THE 0.25MG BY DIALING HALFWAY TO 0.25MG WEEKLY TO START) Denies personal and family history of Medullary thyroid cancer (MTC) Sample given LIBRE NOT COVERED UNDER INSURANCE FOR $0 COPAY--TOO EXPENSIVE FOR PATIENT  Patient enrolled in [place] patient assistance program for free basgalar (med ships to PCP Office due to patient's HD schedule) Current glucose readings: fasting glucose: <155, post prandial glucose: n/a Reports hyperglycemic symptoms Must follow dialysis diet Current exercise: n/a; HD TIW Recommended new start low dose ozempic Assessed patient finances. Will enroll for Ozempic if tolerated  Hyperlipidemia:  New goal. Uncontrolled; current treatment:CRESTOR (ROSUVASTATIN 5MG DAILY);  Current dietary patterns: ON HD 3X WEEKLY; NOT HUGE APPETITE, HOWEVER DOES ENJOY SWEETS, OZEMPIC TO ASSIST Recommended HEART HEALTHY DIET, TAKE STATIN AS PRESCRIBED, NEW START OZEMPIC SHOULD LIKELY HELP WITH LIPID PANEL Lipid Panel     Component Value Date/Time   CHOL 203 (H) 01/31/2021 1001   CHOL 241 (H) 03/25/2013 1026   TRIG 240 (H) 01/31/2021 1001   TRIG 147 10/09/2013 1527   TRIG 209 (H) 03/25/2013 1026   HDL 36 (L) 01/31/2021 1001   HDL 65 10/09/2013 1527   HDL 51 03/25/2013 1026   CHOLHDL 5.6 (H) 01/31/2021 1001   CHOLHDL 3.0 06/17/2008 0625   VLDL 19 06/17/2008 0625   LDLCALC 124 (H) 01/31/2021 1001   LDLCALC 125 (H) 10/09/2013 1527   LDLCALC 148 (H) 03/25/2013 1026   LABVLDL 43 (H) 01/31/2021 1001   Patient Goals/Self-Care Activities patient will:  - take medications  as prescribed as evidenced by patient report and record review check glucose DAILY OR IF SYMPTOMATIC, document, and provide at future appointments engage in dietary modifications by HEART HEALTHY/dialysis DIET  Subjective: [name] is an 70 y.o. year old female who is a primary patient of [name].  The CCM team was consulted for assistance with disease management and care coordination needs.    Engaged with patient face to face for initial visit in response to provider referral for pharmacy case management and/or care coordination services.   Consent to Services:  The patient was given information about Chronic Care Management services, agreed to services, and gave verbal consent prior to initiation of services.  Please see initial visit note for detailed documentation.   Patient Care Team: [name], Baker as PCP - General (Family Medicine) Minus Breeding, Baker as PCP - Cardiology (Cardiology) Fran Lowes, Baker (Inactive) as Consulting Physician (Nephrology) [name], Baker as Referring Physician (Family Medicine) Minus Breeding, Baker as Consulting Physician (Cardiology) [place], [facility], Delice Bison, RN as Case Manager Audley Hinojos, Royce Macadamia, [facility] (Pharmacist) [place], [place] [facility]  Objective:  Lab Results  Component Value Date   CREATININE 8.60 (H) 04/15/2021   CREATININE 9.62 (H) 02/19/2021   CREATININE 10.35 ([place]) 01/31/2021    Lab Results  Component Value Date   HGBA1C 7.9 (H) 02/20/2021   Last diabetic Eye exam:  Lab Results  Component Value Date/Time   HMDIABEYEEXA No Retinopathy 08/04/2020 12:00 AM    Last diabetic Foot exam: No results found for: HMDIABFOOTEX  Component Value Date/Time   CHOL 203 (H) 01/31/2021 1001   CHOL 241 (H) 03/25/2013 1026   TRIG 240 (H) 01/31/2021 1001   TRIG 147 10/09/2013 1527   TRIG 209 (H) 03/25/2013 1026   HDL 36 (L) 01/31/2021 1001   HDL 65 10/09/2013 1527    HDL 51 03/25/2013 1026   CHOLHDL 5.6 (H) 01/31/2021 1001   CHOLHDL 3.0 06/17/2008 0625   VLDL 19 06/17/2008 0625   LDLCALC 124 (H) 01/31/2021 1001   LDLCALC 125 (H) 10/09/2013 1527   LDLCALC 148 (H) 03/25/2013 1026    Hepatic Function Latest Ref Rng & Units 02/19/2021 01/31/2021 11/01/2020  Total Protein 6.5 - 8.1 g/dL 6.8 6.3 6.5  Albumin 3.5 - 5.0 g/dL 3.3(L) 3.9 4.3  AST 15 - 41 U/L '17 13 10  ' ALT 0 - 44 U/L '21 12 13  ' Alk Phosphatase 38 - 126 U/L 87 96 123(H)  Total Bilirubin 0.3 - 1.2 mg/dL 0.2(L) 0.2 0.2  Bilirubin, Direct 0.0 - 0.2 mg/dL <0.1 - -    Lab Results  Component Value Date/Time   TSH 2.450 03/07/2021 12:32 PM   TSH 2.261 03/05/2015 08:48 AM    CBC Latest Ref Rng & Units 04/15/2021 04/11/2021 02/20/2021  WBC 4.0 - 10.5 K/uL - 11.5(H) 11.3(H)  Hemoglobin 12.0 - 15.0 g/dL 10.2(L) 9.5(L) 9.3(L)  Hematocrit 36.0 - 46.0 % 30.0(L) 28.9(L) 30.0(L)  Platelets 150.0 - 400.0 K/uL - 258.0 205    Lab Results  Component Value Date/Time   VD25OH 26.1 (L) 10/09/2013 03:27 PM   VD25OH 36 03/25/2013 10:26 AM    Clinical ASCVD: No  The ASCVD Risk score (Arnett DK, et al., 2019) failed to calculate for the following reasons:   The valid total cholesterol range is 130 to 320 mg/dL    Other: (CHADS2VASc if Afib, PHQ9 if depression, MMRC or CAT for COPD, ACT, DEXA)  Social History   Tobacco Use  Smoking Status Former   Packs/day: 1.00   Years: 30.00   Pack years: 30.00   Types: Cigarettes   Quit date: 12/04/2001   Years since quitting: 19.8  Smokeless Tobacco Never   BP Readings from Last 3 Encounters:  09/15/21 (!) 152/65  08/26/21 127/72  06/09/21 136/67   Pulse Readings from Last 3 Encounters:  09/15/21 84  08/26/21 80  06/09/21 82   Wt Readings from Last 3 Encounters:  09/15/21 192 lb 12.8 oz (87.5 kg)  08/26/21 190 lb (86.2 kg)  06/09/21 192 lb (87.1 kg)    Assessment: Review of patient past medical history, allergies, medications, health status,  including review of consultants reports, laboratory and other test data, was performed as part of comprehensive evaluation and provision of chronic care management services.   SDOH:  (Social Determinants of Health) assessments and interventions performed:    CCM Care Plan  Allergies  Allergen Reactions   Azithromycin     Prolonged QT   Ace Inhibitors Cough   Clopidogrel Bisulfate Other (See Comments)    Sick, Headache, "Felt terrible"   Codeine Other (See Comments)    hallucinations   Lisinopril Cough   Penicillins Other (See Comments)    Unknown reaction Did it involve swelling of the face/tongue/throat, SOB, or low BP? Unknown Did it involve sudden or severe rash/hives, skin peeling, or any reaction on the inside of your mouth or nose? Unknown Did you need to seek medical attention at a hospital or doctor's office? Unknown When did it last happen?  childhood allergy If all above answers are "NO", may proceed with cephalosporin use.      Medications Reviewed Today     Reviewed by Lavera Guise, [facility] (Pharmacist) on 09/30/21 at 1434  Med List Status: <None>   Medication Order Taking? Sig Documenting Provider Last Dose Status Informant  0.9 %  sodium chloride infusion [id number]   Angelia Mould, Baker  Active   acetaminophen (TYLENOL) 500 MG tablet [id number]  Take 1,000 mg by mouth every 6 (six) hours as needed for moderate pain or headache. Provider, Historical, Baker  Active Self  albuterol (PROVENTIL) (2.5 MG/3ML) 0.083% nebulizer solution [id number]  Take 3 mLs (2.5 mg total) by nebulization every 6 (six) hours as needed for wheezing or shortness of breath. Hassell Done, Mary-Margaret, FNP  Active Self  albuterol (VENTOLIN HFA) 108 (90 Base) MCG/ACT inhaler [id number]  Inhale 2 puffs into the lungs every 6 (six) hours as needed for wheezing or shortness of breath. Parrett, Fonnie Mu, NP  Active Self  aspirin 81 MG EC tablet [id number]  Take 81 mg by mouth daily. Provider,  Historical, Baker  Active Self  atenolol (TENORMIN) 50 MG tablet [id number]  Take 1 tablet (50 mg total) by mouth 2 (two) times daily. [name], Baker  Active   Budeson-Glycopyrrol-Formoterol (BREZTRI AEROSPHERE) 160-9-4.8 MCG/ACT Hollie Salk [id number]  Inhale 2 puffs into the lungs in the morning and at bedtime. PLEASE FILL 3 MONTH SUPPLY WITH 4 REFILLS [name]  Active   busPIRone (BUSPAR) 7.5 MG tablet [id number]  Take 1 tablet (7.5 mg total) by mouth 3 (three) times daily as needed. [name], Baker  Active   calcium acetate (PHOSLO) 667 MG capsule [id number]  Take [id number] mg by mouth See admin instructions. Take 1334 mg with meals 3 times daily and 667 mg with snacks Provider, Historical, Baker  Active Self  Carboxymethylcellul-Glycerin (LUBRICATING EYE DROPS OP) [id number]  Place 1 drop into both eyes daily as needed (dry eyes). Provider, Historical, Baker  Active Self  cetirizine (ZYRTEC) 10 MG tablet [id number]  Take 1 tablet (10 mg total) by mouth daily as needed for allergies. [name], Baker  Active   Continuous Blood Gluc Sensor (FREESTYLE LIBRE 2 SENSOR) [place] [id number]  Use multiple times daily to track glucose to prevent highs and lows as a complication of dialysis Dx:Z99.2, E11.59 [name], Baker  Active   DULoxetine (CYMBALTA) 20 MG capsule [id number]  Take by mouth. Provider, Historical, Baker  Active   fluconazole (DIFLUCAN) 150 MG tablet [id number]  Take 1 tablet (150 mg total) by mouth every three (3) days as needed. Evelina Dun A, FNP  Active   gabapentin (NEURONTIN) 400 MG capsule [id number]  Take 1 capsule (400 mg total) by mouth See admin instructions. Take it after HD Barton Dubois, Baker  Active Self  glucose blood (ONETOUCH ULTRA) test strip [id number]  Use to test blood sugar 3 times daily as directed. DX: E11.9 [name], Baker  Active Self  Insulin Glargine [facility] KWIKPEN) 100 UNIT/ML [id number]  Inject 20 Units into the skin at bedtime. Add five units each time the fasting  is over 125 three days in a row. [name], Baker  Active            Med Note Blanca Friend, Royce Macadamia   Fri Sep 30, 2021  2:34 PM) [facility]  montelukast (SINGULAIR) 10 MG tablet [id number]  Take by mouth. Provider, Historical, Baker  Active   NIFEdipine (PROCARDIA XL/NIFEDICAL XL) 60  MG 24 hr tablet [id number]  TAKE 1 TABLET BY MOUTH ONCE DAILY (ON  NON-DIALYSIS  DAYS) Verta Ellen., NP  Active   nitroGLYCERIN (NITROSTAT) 0.4 MG SL tablet [id number]  Place 1 tablet (0.4 mg total) under the tongue every 5 (five) minutes as needed for chest pain. Minus Breeding, Baker  Active Self  omeprazole (PRILOSEC) 20 MG capsule [id number]  Take 1 capsule (20 mg total) by mouth daily. Ivy Lynn, NP  Active   ondansetron (ZOFRAN) 4 MG tablet [id number]  TAKE 1 TABLET BY MOUTH EVERY 8 HOURS AS NEEDED FOR NAUSEA AND VOMITING [name], Baker  Active   polyethylene glycol powder (GLYCOLAX/MIRALAX) 17 GM/SCOOP powder [id number]  Take 17 g by mouth 2 (two) times daily as needed for moderate constipation. For constipation [name], Baker  Active   QUEtiapine (SEROQUEL) 50 MG tablet [id number]  Take 1 tablet (50 mg total) by mouth at bedtime. For sleep and for anxiety [name], Baker  Active   rOPINIRole (REQUIP) 1 MG tablet [id number]  Take 1 tablet (1 mg total) by mouth at bedtime. For leg cramps [name], Baker  Active   rosuvastatin (CRESTOR) 5 MG tablet [id number]  Take 1 tablet (5 mg total) by mouth daily. For cholesterol [name], Baker  Active            Med Note Gentry Roch   Thu Apr 14, 2021  3:20 PM)    sodium chloride flush (NS) 0.9 % injection 3 mL [id number]   Angelia Mould, Baker  Active   sodium chloride flush (NS) 0.9 % injection 3 mL [id number]   Angelia Mould, Baker  Active             Patient Active Problem List   Diagnosis Date Noted   Nausea 08/26/2021   Chest pain 02/19/2021   Hypertension    Hyperlipidemia    Type 2 diabetes mellitus  with hyperglycemia ([place])    Hypoalbuminemia    Bruit 01/05/2020   Educated about COVID-19 virus infection 01/05/2020   End stage renal disease on dialysis due to type 2 diabetes mellitus ([place]) 07/21/2019   Lung nodule 05/01/2018   Prolonged Q-T interval on ECG 09/06/2017   Coronary artery disease involving native coronary artery of native heart without angina pectoris 09/06/2017   GAD (generalized anxiety disorder) 03/04/2016   Leg pain 01/07/2016   Anemia in chronic kidney disease 12/10/2015   Dependence on renal dialysis ([place]) 12/10/2015   End stage renal disease ([place]) 12/10/2015   Osteopathy in diseases classified elsewhere, unspecified site 12/10/2015   Constipation 10/25/2015   Chronic obstructive pulmonary disease ([place]) 03/06/2015   Pericardial effusion 03/06/2015   Chronic diastolic CHF (congestive heart failure) ([place]) 03/05/2015   PAD (peripheral artery disease) ([place]) 04/21/2014   Dyspnea 09/29/2013   Obesity 12/05/2011   Diverticulosis 03/29/2011   Diabetic neuropathy ([place]) 03/29/2011   Esophageal stricture 03/29/2011   Vitamin D deficiency 03/29/2011   Degenerative joint disease of left shoulder 03/29/2011   Neck pain 03/29/2011   Type II diabetes mellitus with nephropathy ([place]) 04/19/2009   Hyperlipidemia associated with type 2 diabetes mellitus ([place]) 04/19/2009   Hypertension associated with diabetes ([place]) 04/19/2009   Coronary atherosclerosis 04/19/2009    Immunization History  Administered Date(s) Administered   DTaP 07/19/2010   Hepatitis B, adult 12/04/2015, 01/04/2016, 02/08/2016, 06/01/2016, 10/17/2016, 11/11/2016, 12/16/2016, 04/12/2017, 04/08/2019   Influenza Split 10/04/2013, 09/14/2015   Influenza Whole 09/03/2006   Influenza, Seasonal, Injecte, Preservative  Fre 08/24/2011, 09/25/2012   Influenza,inj,Quad PF,6+ Mos 09/11/2013, 09/09/2014   Influenza-Unspecified 09/14/2015, 08/25/2016, 08/21/2019, 09/09/2020   Moderna SARS-COV2 Booster Vaccination  11/03/2020   Moderna Sars-Covid-2 Vaccination 02/09/2020, 03/11/2020   PPD Test 11/23/2015, 12/26/2016, 01/03/2018, 01/09/2019, 04/20/2020, 03/31/2021   Pneumococcal Conjugate-13 07/07/2017   Pneumococcal Polysaccharide-23 08/26/2012, 07/07/2017, 12/13/2018   Pneumococcal-Unspecified 08/26/2012, 07/07/2017   Td 07/19/2010   Tdap 08/24/2011   Zoster Recombinat (Shingrix) 09/09/2019, 11/10/2019    Conditions to be addressed/monitored: HLD, DMII, and ESRD  Care Plan : PHARMD MEDICATION MANAGEMENT  Updates made by Lavera Guise, [place] since 10/19/2021 12:00 AM     Problem: DISEASE PROGRESSION PREVENTION      Long-Range Goal: T2DM, HLD   This Visit's Progress: Not on track  Priority: High  Note:   Current Barriers:  Unable to independently afford treatment regimen Unable to maintain control of T2DM, HLD Suboptimal therapeutic regimen for T2DM  Pharmacist Clinical Goal(s):  patient will verbalize ability to afford treatment regimen achieve control of T2DM as evidenced by GOAL A1C, IMPROVED BLOOD SUGARS WITH STEADY CONTROL  through collaboration with PharmD and provider.    Interventions: 1:1 collaboration with [name], Baker regarding development and update of comprehensive plan of care as evidenced by provider attestation and co-signature Inter-disciplinary care team collaboration (see longitudinal plan of care) Comprehensive medication review performed; medication list updated in electronic medical record  Diabetes: New goal. Uncontrolled--A1C 7.9%; current treatment:BASAGLAR 20 UNITS;  INTOLERANCES:  DID NOT TOLERATE TRULICITY WELL DUE TO GI (EVEN AT LOWEST DOSE) LIMITED DUE TO HD TIW WILL TRIAL OZEMPIC LOW DOSE (50% OF THE 0.25MG BY DIALING HALFWAY TO 0.25MG WEEKLY TO START) Denies personal and family history of Medullary thyroid cancer ([place]) Sample given LIBRE NOT COVERED UNDER INSURANCE FOR $0 COPAY--TOO EXPENSIVE FOR PATIENT  Patient enrolled in [place]  patient assistance program for free basgalar (med ships to PCP Office due to patient's HD schedule) Current glucose readings: fasting glucose: <155, post prandial glucose: n/a Reports hyperglycemic symptoms Must follow dialysis diet Current exercise: n/a; HD TIW Recommended new start low dose ozempic Assessed patient finances. Will enroll for Ozempic if tolerated  Hyperlipidemia:  New goal. Uncontrolled; current treatment:CRESTOR (ROSUVASTATIN 5MG DAILY);  Current dietary patterns: ON HD 3X WEEKLY; NOT HUGE APPETITE, HOWEVER DOES ENJOY SWEETS, OZEMPIC TO ASSIST Recommended HEART HEALTHY DIET, TAKE STATIN AS PRESCRIBED, NEW START OZEMPIC SHOULD LIKELY HELP WITH LIPID PANEL Lipid Panel     Component Value Date/Time   CHOL 203 (H) 01/31/2021 1001   CHOL 241 (H) 03/25/2013 1026   TRIG 240 (H) 01/31/2021 1001   TRIG 147 10/09/2013 1527   TRIG 209 (H) 03/25/2013 1026   HDL 36 (L) 01/31/2021 1001   HDL 65 10/09/2013 1527   HDL 51 03/25/2013 1026   CHOLHDL 5.6 (H) 01/31/2021 1001   CHOLHDL 3.0 06/17/2008 0625   VLDL 19 06/17/2008 0625   LDLCALC 124 (H) 01/31/2021 1001   LDLCALC 125 (H) 10/09/2013 1527   LDLCALC 148 (H) 03/25/2013 1026   LABVLDL 43 (H) 01/31/2021 1001  Patient Goals/Self-Care Activities patient will:  - take medications as prescribed as evidenced by patient report and record review check glucose DAILY OR IF SYMPTOMATIC, document, and provide at future appointments engage in dietary modifications by HEART HEALTHY/dialysis DIET      Medication Assistance:  BASAGLAR obtained through [place] medication assistance program.  Enrollment ends 12/03/21  Patient's preferred pharmacy is:  [facility] [address], [place] [address] [address]  [address] [place] [address] Phone: [phone] Fax: [phone]  [facility] [place], [place] [address] Phone: [phone] Fax: [phone]  Uses pill box?  Yes Pt endorses 100% compliance  Follow Up:  Patient agrees to Care Plan and Follow-up.  Plan: Telephone follow up appointment with care management team member scheduled for:  4 WEEKS  Regina Eck, PharmD, BCPS Clinical Pharmacist, [place]  II Phone [phone]

## 2021-10-01 DIAGNOSIS — D631 Anemia in chronic kidney disease: Secondary | ICD-10-CM | POA: Diagnosis not present

## 2021-10-01 DIAGNOSIS — D509 Iron deficiency anemia, unspecified: Secondary | ICD-10-CM | POA: Diagnosis not present

## 2021-10-01 DIAGNOSIS — Z23 Encounter for immunization: Secondary | ICD-10-CM | POA: Diagnosis not present

## 2021-10-01 DIAGNOSIS — N186 End stage renal disease: Secondary | ICD-10-CM | POA: Diagnosis not present

## 2021-10-01 DIAGNOSIS — N2581 Secondary hyperparathyroidism of renal origin: Secondary | ICD-10-CM | POA: Diagnosis not present

## 2021-10-01 DIAGNOSIS — Z992 Dependence on renal dialysis: Secondary | ICD-10-CM | POA: Diagnosis not present

## 2021-10-03 ENCOUNTER — Telehealth: Payer: Self-pay | Admitting: Family Medicine

## 2021-10-03 DIAGNOSIS — Z794 Long term (current) use of insulin: Secondary | ICD-10-CM

## 2021-10-03 DIAGNOSIS — E1169 Type 2 diabetes mellitus with other specified complication: Secondary | ICD-10-CM

## 2021-10-03 DIAGNOSIS — E1165 Type 2 diabetes mellitus with hyperglycemia: Secondary | ICD-10-CM

## 2021-10-03 DIAGNOSIS — E785 Hyperlipidemia, unspecified: Secondary | ICD-10-CM

## 2021-10-03 DIAGNOSIS — N186 End stage renal disease: Secondary | ICD-10-CM | POA: Diagnosis not present

## 2021-10-03 DIAGNOSIS — Z992 Dependence on renal dialysis: Secondary | ICD-10-CM | POA: Diagnosis not present

## 2021-10-03 NOTE — Telephone Encounter (Signed)
Pt called to let Almyra Free know that she spoke with someone from her insurance company who told her that they are unable to approve request for [place].  Says pt has to take insulin at least 3x/day in order to be approved for [place].

## 2021-10-04 DIAGNOSIS — N186 End stage renal disease: Secondary | ICD-10-CM | POA: Diagnosis not present

## 2021-10-04 DIAGNOSIS — D631 Anemia in chronic kidney disease: Secondary | ICD-10-CM | POA: Diagnosis not present

## 2021-10-04 DIAGNOSIS — D509 Iron deficiency anemia, unspecified: Secondary | ICD-10-CM | POA: Diagnosis not present

## 2021-10-04 DIAGNOSIS — Z992 Dependence on renal dialysis: Secondary | ICD-10-CM | POA: Diagnosis not present

## 2021-10-04 DIAGNOSIS — N2581 Secondary hyperparathyroidism of renal origin: Secondary | ICD-10-CM | POA: Diagnosis not present

## 2021-10-19 NOTE — Patient Instructions (Addendum)
Visit Information  Current Barriers:  Unable to independently afford treatment regimen Unable to maintain control of T2DM, HLD Suboptimal therapeutic regimen for T2DM  Pharmacist Clinical Goal(s):  patient will verbalize ability to afford treatment regimen achieve control of T2DM as evidenced by GOAL A1C, IMPROVED BLOOD SUGARS WITH STEADY CONTROL through collaboration with PharmD and provider.    Interventions: 1:1 collaboration with [name], MD regarding development and update of comprehensive plan of care as evidenced by provider attestation and co-signature Inter-disciplinary care team collaboration (see longitudinal plan of care) Comprehensive medication review performed; medication list updated in electronic medical record  Diabetes: New goal. Uncontrolled--A1C 7.9%; current treatment:BASAGLAR 20 UNITS;  INTOLERANCES:  DID NOT TOLERATE TRULICITY WELL DUE TO GI (EVEN AT LOWEST DOSE) LIMITED DUE TO HD TIW WILL TRIAL OZEMPIC LOW DOSE (50% OF THE 0.25MG  BY DIALING HALFWAY TO 0.25MG  WEEKLY TO START) Denies personal and family history of Medullary thyroid cancer ([place]) Sample given LIBRE NOT COVERED UNDER INSURANCE FOR $0 COPAY--TOO EXPENSIVE FOR PATIENT  Patient enrolled in [place] patient assistance program for free basgalar (med ships to PCP Office due to patient's HD schedule) Current glucose readings: fasting glucose: <155, post prandial glucose: n/a Reports hyperglycemic symptoms Must follow dialysis diet Current exercise: n/a; HD TIW Recommended new start low dose ozempic Assessed patient finances. Will enroll for Ozempic if tolerated  Hyperlipidemia:  New goal. Uncontrolled; current treatment:CRESTOR (ROSUVASTATIN 5MG  DAILY);  Current dietary patterns: ON HD 3X WEEKLY; NOT HUGE APPETITE, HOWEVER DOES ENJOY SWEETS, OZEMPIC TO ASSIST Recommended HEART HEALTHY DIET, TAKE STATIN AS PRESCRIBED, NEW START OZEMPIC SHOULD LIKELY HELP WITH LIPID PANEL Lipid Panel      Component Value Date/Time   CHOL 203 (H) 01/31/2021 1001   CHOL 241 (H) 03/25/2013 1026   TRIG 240 (H) 01/31/2021 1001   TRIG 147 10/09/2013 1527   TRIG 209 (H) 03/25/2013 1026   HDL 36 (L) 01/31/2021 1001   HDL 65 10/09/2013 1527   HDL 51 03/25/2013 1026   CHOLHDL 5.6 (H) 01/31/2021 1001   CHOLHDL 3.0 06/17/2008 0625   VLDL 19 06/17/2008 0625   LDLCALC 124 (H) 01/31/2021 1001   LDLCALC 125 (H) 10/09/2013 1527   LDLCALC 148 (H) 03/25/2013 1026   LABVLDL 43 (H) 01/31/2021 1001   Patient Goals/Self-Care Activities patient will:  - take medications as prescribed as evidenced by patient report and record review check glucose DAILY OR IF SYMPTOMATIC, document, and provide at future appointments engage in dietary modifications by HEART HEALTHY/dialysis DIET   The patient verbalized understanding of instructions, educational materials, and care plan provided today and declined offer to receive copy of patient instructions, educational materials, and care plan.   Telephone follow up appointment with care management team member scheduled for: 1 month  Signature Regina Eck, PharmD, BCPS Clinical Pharmacist, [place]  II Phone [phone]

## 2021-10-25 ENCOUNTER — Ambulatory Visit (INDEPENDENT_AMBULATORY_CARE_PROVIDER_SITE_OTHER): Payer: Medicare Other | Admitting: Pharmacist

## 2021-10-25 DIAGNOSIS — Z794 Long term (current) use of insulin: Secondary | ICD-10-CM

## 2021-10-25 DIAGNOSIS — E1169 Type 2 diabetes mellitus with other specified complication: Secondary | ICD-10-CM

## 2021-10-25 DIAGNOSIS — E1165 Type 2 diabetes mellitus with hyperglycemia: Secondary | ICD-10-CM

## 2021-10-25 NOTE — Progress Notes (Signed)
Chronic Care Management Pharmacy Note  10/25/2021 Name:  [name] MRN:  [id number] DOB:  09-Nov-1951  Summary: T2DM, HLD  Recommendations/Changes made from today's visit:  Diabetes: New goal. Uncontrolled--A1C 7.9%; current treatment:BASAGLAR 20 UNITS, newly started Ozempic 0.16m INTOLERANCES:  DID NOT TOLERATE TRULICITY WELL DUE TO GI (EVEN AT LOWEST DOSE) LIMITED DUE TO HD TIW CONTINUE TRIAL OZEMPIC LOW DOSE (50% OF THE 0.25MG BY DIALING HALFWAY TO 0.25MG WEEKLY TO START) Denies personal and family history of Medullary thyroid cancer (MTC) Sample given TOLERATING WELL LIBRE NOT COVERED UNDER INSURANCE FOR $0 COPAY--TOO EXPENSIVE FOR PATIENT  [place] (med ships to PCP Office due to patient's HD schedule) Current glucose readings: fasting glucose: <155, post prandial glucose: n/a Reports hyperglycemic symptoms Must follow dialysis diet Current exercise: n/a; HD TIW Recommended new start low dose ozempic Assessed patient finances. Will enroll for Ozempic if tolerated  Hyperlipidemia:  New goal. Uncontrolled; current treatment:CRESTOR (ROSUVASTATIN 5MG DAILY);  Current dietary patterns: ON HD 3X WEEKLY; NOT HUGE APPETITE, HOWEVER DOES ENJOY SWEETS, OZEMPIC TO ASSIST Recommended HEART HEALTHY DIET, TAKE STATIN AS PRESCRIBED, NEW START OZEMPIC SHOULD LIKELY HELP WITH LIPID PANEL Lipid Panel     Component Value Date/Time   CHOL 203 (H) 01/31/2021 1001   CHOL 241 (H) 03/25/2013 1026   TRIG 240 (H) 01/31/2021 1001   TRIG 147 10/09/2013 1527   TRIG 209 (H) 03/25/2013 1026   HDL 36 (L) 01/31/2021 1001   HDL 65 10/09/2013 1527   HDL 51 03/25/2013 1026   CHOLHDL 5.6 (H) 01/31/2021 1001   CHOLHDL 3.0 06/17/2008 0625   VLDL 19 06/17/2008 0625   LDLCALC 124 (H) 01/31/2021 1001   LDLCALC 125 (H) 10/09/2013 1527   LDLCALC 148 (H) 03/25/2013 1026   LABVLDL 43 (H) 01/31/2021 1001   Patient Goals/Self-Care  Activities patient will:  - take medications as prescribed as evidenced by patient report and record review check glucose DAILY OR IF SYMPTOMATIC, document, and provide at future appointments engage in dietary modifications by HEART HEALTHY/dialysis DIET  Subjective: Tamara NAFFis an 70y.o. year old female who is a primary patient of [name], WCletus Gash MD.  The CCM team was consulted for assistance with disease management and care coordination needs.    Engaged with patient by telephone for follow up visit in response to provider referral for pharmacy case management and/or care coordination services.   Consent to Services:  The patient was given information about Chronic Care Management services, agreed to services, and gave verbal consent prior to initiation of services.  Please see initial visit note for detailed documentation.   Patient Care Team: SClaretta Fraise MD as PCP - General (Family Medicine) HMinus Breeding MD as PCP - Cardiology (Cardiology) BFran Lowes MD (Inactive) as Consulting Physician (Nephrology) SClaretta Fraise MD as Referring Physician (Family Medicine) HMinus Breeding MD as Consulting Physician (Cardiology) CMoenkopi [facility]Franc[facility] KDelice Bison RN as Case Manager Endi Lagman, JRoyce Macadamia [facility](Pharmacist) CHallam [facility]ialysis Care Of [facility]  Objective:  Lab Results  Component Value Date   CREATININE 8.60 (H) 04/15/2021   CREATININE 9.62 (H) 02/19/2021   CREATININE 10.35 ([place] 01/31/2021    Lab Results  Component Value Date   HGBA1C 7.9 (H) 02/20/2021   Last diabetic Eye exam:  Lab Results  Component Value Date/Time   HMDIABEYEEXA No Retinopathy 08/04/2020 12:00 AM    Last diabetic Foot exam: No results found for:  HMDIABFOOTEX      Component Value Date/Time   CHOL 203 (H) 01/31/2021 1001   CHOL 241 (H) 03/25/2013 1026   TRIG 240 (H) 01/31/2021 1001   TRIG 147 10/09/2013 1527   TRIG 209 (H) 03/25/2013 1026   HDL  36 (L) 01/31/2021 1001   HDL 65 10/09/2013 1527   HDL 51 03/25/2013 1026   CHOLHDL 5.6 (H) 01/31/2021 1001   CHOLHDL 3.0 06/17/2008 0625   VLDL 19 06/17/2008 0625   LDLCALC 124 (H) 01/31/2021 1001   LDLCALC 125 (H) 10/09/2013 1527   LDLCALC 148 (H) 03/25/2013 1026    Hepatic Function Latest Ref Rng & Units 02/19/2021 01/31/2021 11/01/2020  Total Protein 6.5 - 8.1 g/dL 6.8 6.3 6.5  Albumin 3.5 - 5.0 g/dL 3.3(L) 3.9 4.3  AST 15 - 41 U/L _0 ALT 0 - 44 U/L _1 Alk Phosphatase 38 - 126 U/L 87 96 123(H)  Total Bilirubin 0.3 - 1.2 mg/dL 0.2(L) 0.2 0.2  Bilirubin, Direct 0.0 - 0.2 mg/dL <0.1 - -    Lab Results  Component Value Date/Time   TSH 2.450 03/07/2021 12:32 PM   TSH 2.261 03/05/2015 08:48 AM    CBC Latest Ref Rng & Units 04/15/2021 04/11/2021 02/20/2021  WBC 4.0 - 10.5 K/uL - 11.5(H) 11.3(H)  Hemoglobin 12.0 - 15.0 g/dL 10.2(L) 9.5(L) 9.3(L)  Hematocrit 36.0 - 46.0 % 30.0(L) 28.9(L) 30.0(L)  Platelets 150.0 - 400.0 K/uL - 258.0 205    Lab Results  Component Value Date/Time   VD25OH 26.1 (L) 10/09/2013 03:27 PM   VD25OH 36 03/25/2013 10:26 AM    Clinical ASCVD: No  The ASCVD Risk score (Arnett DK, et al., 2019) failed to calculate for the following reasons:   The valid total cholesterol range is 130 to 320 mg/dL    Other: (CHADS2VASc if Afib, PHQ9 if depression, MMRC or CAT for COPD, ACT, DEXA)  Social History   Tobacco Use  Smoking Status Former   Packs/day: 1.00   Years: 30.00   Pack years: 30.00   Types: Cigarettes   Quit date: 12/04/2001   Years since quitting: 19.9  Smokeless Tobacco Never   BP Readings from Last 3 Encounters:  09/15/21 (!) 152/65  08/26/21 127/72  06/09/21 136/67   Pulse Readings from Last 3 Encounters:  09/15/21 84  08/26/21 80  06/09/21 82   Wt Readings from Last 3 Encounters:  09/15/21 192 lb 12.8 oz (87.5 kg)  08/26/21 190 lb (86.2 kg)  06/09/21 192 lb (87.1 kg)    Assessment: Review of patient past medical  history, allergies, medications, health status, including review of consultants reports, laboratory and other test data, was performed as part of comprehensive evaluation and provision of chronic care management services.   SDOH:  (Social Determinants of Health) assessments and interventions performed:    CCM Care Plan  Allergies  Allergen Reactions   Azithromycin     Prolonged QT   Ace Inhibitors Cough   Clopidogrel Bisulfate Other (See Comments)    Sick, Headache, "Felt terrible"   Codeine Other (See Comments)    hallucinations   Lisinopril Cough   Penicillins Other (See Comments)    Unknown reaction Did it involve swelling of the face/tongue/throat, SOB, or low BP? Unknown Did it involve sudden or severe rash/hives, skin peeling, or any reaction on the inside of your mouth or nose? Unknown Did you need to seek medical attention at a hospital or doctor's office? Unknown When did  it last happen?      childhood allergy If all above answers are "NO", may proceed with cephalosporin use.      Medications Reviewed Today     Reviewed by Lavera Guise, [facility] (Pharmacist) on 09/30/21 at 1434  Med List Status: <None>   Medication Order Taking? Sig Documenting Provider Last Dose Status Informant  0.9 %  sodium chloride infusion [id number]   Angelia Mould, MD  Active   acetaminophen (TYLENOL) 500 MG tablet [id number]  Take 1,000 mg by mouth every 6 (six) hours as needed for moderate pain or headache. [provider]  Active Self  albuterol (PROVENTIL) (2.5 MG/3ML) 0.083% nebulizer solution [id number]  Take 3 mLs (2.5 mg total) by nebulization every 6 (six) hours as needed for wheezing or shortness of breath. Hassell Done, Mary-Margaret, FNP  Active Self  albuterol (VENTOLIN HFA) 108 (90 Base) MCG/ACT inhaler [id number]  Inhale 2 puffs into the lungs every 6 (six) hours as needed for wheezing or shortness of breath. Parrett, Fonnie Mu, NP  Active Self  aspirin 81 MG EC tablet [id number]   Take 81 mg by mouth daily. [provider]  Active Self  atenolol (TENORMIN) 50 MG tablet [id number]  Take 1 tablet (50 mg total) by mouth 2 (two) times daily. [name], MD  Active   Budeson-Glycopyrrol-Formoterol (BREZTRI AEROSPHERE) 160-9-4.8 MCG/ACT Hollie Salk [id number]  Inhale 2 puffs into the lungs in the morning and at bedtime. PLEASE FILL 3 MONTH SUPPLY WITH 4 REFILLS [name], [name], MD  Active   busPIRone (BUSPAR) 7.5 MG tablet [id number]  Take 1 tablet (7.5 mg total) by mouth 3 (three) times daily as needed. [name], MD  Active   calcium acetate (PHOSLO) 667 MG capsule [id number]  Take [phone] mg by mouth See admin instructions. Take 1334 mg with meals 3 times daily and 667 mg with snacks [provider]  Active Self  Carboxymethylcellul-Glycerin (LUBRICATING EYE DROPS OP) [id number]  Place 1 drop into both eyes daily as needed (dry eyes). [provider]  Active Self  cetirizine (ZYRTEC) 10 MG tablet [id number]  Take 1 tablet (10 mg total) by mouth daily as needed for allergies. [name], MD  Active   Continuous Blood Gluc Sensor (FREESTYLE LIBRE 2 SENSOR) [place] [id number]  Use multiple times daily to track glucose to prevent highs and lows as a complication of dialysis Dx:Z99.2, E11.59 [name], MD  Active   DULoxetine (CYMBALTA) 20 MG capsule [id number]  Take by mouth. [provider]  Active   fluconazole (DIFLUCAN) 150 MG tablet [id number]  Take 1 tablet (150 mg total) by mouth every three (3) days as needed. Evelina Dun A, FNP  Active   gabapentin (NEURONTIN) 400 MG capsule [id number]  Take 1 capsule (400 mg total) by mouth See admin instructions. Take it after HD Barton Dubois, MD  Active Self  glucose blood (ONETOUCH ULTRA) test strip [id number]  Use to test blood sugar 3 times daily as directed. DX: E11.9 [name], MD  Active Self  Insulin Glargine [facility] KWIKPEN) 100 UNIT/ML [id number]  Inject 20 Units into the skin at bedtime.  Add five units each time the fasting is over 125 three days in a row. [name], MD  Active            Med Note Blanca Friend, Royce Macadamia   Fri Sep 30, 2021  2:34 PM) [facility]  montelukast (SINGULAIR) 10 MG tablet [id number]  Take by mouth. [provider]  Active   NIFEdipine (PROCARDIA XL/NIFEDICAL XL) 60 MG 24 hr tablet [id number]  TAKE 1 TABLET BY MOUTH ONCE DAILY (ON  NON-DIALYSIS  DAYS) Verta Ellen., NP  Active   nitroGLYCERIN (NITROSTAT) 0.4 MG SL tablet [id number]  Place 1 tablet (0.4 mg total) under the tongue every 5 (five) minutes as needed for chest pain. Minus Breeding, MD  Active Self  omeprazole (PRILOSEC) 20 MG capsule [id number]  Take 1 capsule (20 mg total) by mouth daily. Ivy Lynn, NP  Active   ondansetron (ZOFRAN) 4 MG tablet [id number]  TAKE 1 TABLET BY MOUTH EVERY 8 HOURS AS NEEDED FOR NAUSEA AND VOMITING [name], MD  Active   polyethylene glycol powder (GLYCOLAX/MIRALAX) 17 GM/SCOOP powder [id number]  Take 17 g by mouth 2 (two) times daily as needed for moderate constipation. For constipation [name], MD  Active   QUEtiapine (SEROQUEL) 50 MG tablet [id number]  Take 1 tablet (50 mg total) by mouth at bedtime. For sleep and for anxiety [name], MD  Active   rOPINIRole (REQUIP) 1 MG tablet [id number]  Take 1 tablet (1 mg total) by mouth at bedtime. For leg cramps [name], MD  Active   rosuvastatin (CRESTOR) 5 MG tablet [id number]  Take 1 tablet (5 mg total) by mouth daily. For cholesterol [name], MD  Active            Med Note Gentry Roch   Thu Apr 14, 2021  3:20 PM)    sodium chloride flush (NS) 0.9 % injection 3 mL [id number]   Angelia Mould, MD  Active   sodium chloride flush (NS) 0.9 % injection 3 mL [id number]   Angelia Mould, MD  Active             Patient Active Problem List   Diagnosis Date Noted   Nausea 08/26/2021   Chest pain 02/19/2021   Hypertension     Hyperlipidemia    Type 2 diabetes mellitus with hyperglycemia ([place])    Hypoalbuminemia    Bruit 01/05/2020   Educated about COVID-19 virus infection 01/05/2020   End stage renal disease on dialysis due to type 2 diabetes mellitus ([place]) 07/21/2019   Lung nodule 05/01/2018   Prolonged Q-T interval on ECG 09/06/2017   Coronary artery disease involving native coronary artery of native heart without angina pectoris 09/06/2017   GAD (generalized anxiety disorder) 03/04/2016   Leg pain 01/07/2016   Anemia in chronic kidney disease 12/10/2015   Dependence on renal dialysis ([place]) 12/10/2015   End stage renal disease ([place]) 12/10/2015   Osteopathy in diseases classified elsewhere, unspecified site 12/10/2015   Constipation 10/25/2015   Chronic obstructive pulmonary disease ([place]) 03/06/2015   Pericardial effusion 03/06/2015   Chronic diastolic CHF (congestive heart failure) ([place]) 03/05/2015   PAD (peripheral artery disease) ([place]) 04/21/2014   Dyspnea 09/29/2013   Obesity 12/05/2011   Diverticulosis 03/29/2011   Diabetic neuropathy ([place]) 03/29/2011   Esophageal stricture 03/29/2011   Vitamin D deficiency 03/29/2011   Degenerative joint disease of left shoulder 03/29/2011   Neck pain 03/29/2011   Type II diabetes mellitus with nephropathy ([place]) 04/19/2009   Hyperlipidemia associated with type 2 diabetes mellitus ([place]) 04/19/2009   Hypertension associated with diabetes ([place]) 04/19/2009   Coronary atherosclerosis 04/19/2009    Immunization History  Administered Date(s) Administered   DTaP 07/19/2010   Hepatitis B, adult 12/04/2015, 01/04/2016, 02/08/2016, 06/01/2016, 10/17/2016, 11/11/2016, 12/16/2016, 04/12/2017, 04/08/2019   Influenza Split 10/04/2013, 09/14/2015   Influenza  Whole 09/03/2006   Influenza, Seasonal, Injecte, Preservative Fre 08/24/2011, 09/25/2012   Influenza,inj,Quad PF,6+ Mos 09/11/2013, 09/09/2014   Influenza-Unspecified 09/14/2015, 08/25/2016, 08/21/2019, 09/09/2020    Moderna SARS-COV2 Booster Vaccination 11/03/2020   Moderna Sars-Covid-2 Vaccination 02/09/2020, 03/11/2020   PPD Test 11/23/2015, 12/26/2016, 01/03/2018, 01/09/2019, 04/20/2020, 03/31/2021   Pneumococcal Conjugate-13 07/07/2017   Pneumococcal Polysaccharide-23 08/26/2012, 07/07/2017, 12/13/2018   Pneumococcal-Unspecified 08/26/2012, 07/07/2017   Td 07/19/2010   Tdap 08/24/2011   Zoster Recombinat (Shingrix) 09/09/2019, 11/10/2019    Conditions to be addressed/monitored: HLD and DMII  Care Plan : PHARMD MEDICATION MANAGEMENT  Updates made by Lavera Guise, Craig since 10/25/2021 12:00 AM     Problem: DISEASE PROGRESSION PREVENTION      Long-Range Goal: T2DM, HLD   Recent Progress: Not on track  Priority: High  Note:   Current Barriers:  Unable to independently afford treatment regimen Unable to maintain control of T2DM, HLD Suboptimal therapeutic regimen for T2DM  Pharmacist Clinical Goal(s):  patient will verbalize ability to afford treatment regimen achieve control of T2DM as evidenced by GOAL A1C, IMPROVED BLOOD SUGARS WITH STEADY CONTROL  through collaboration with PharmD and provider.    Interventions: 1:1 collaboration with [name], MD regarding development and update of comprehensive plan of care as evidenced by provider attestation and co-signature Inter-disciplinary care team collaboration (see longitudinal plan of care) Comprehensive medication review performed; medication list updated in electronic medical record  Diabetes: New goal. Uncontrolled--A1C 7.9%; current treatment:BASAGLAR 20 UNITS, newly started Ozempic 0.46m INTOLERANCES:  DID NOT TOLERATE TRULICITY WELL DUE TO GI (EVEN AT LOWEST DOSE) LIMITED DUE TO HD TIW CONTINUE TRIAL OZEMPIC LOW DOSE (50% OF THE 0.25MG BY DIALING HALFWAY TO 0.25MG WEEKLY TO START) Denies personal and family history of Medullary thyroid cancer ([place] Sample given TOLERATING WELL LIBRE NOT COVERED UNDER INSURANCE FOR  $0 COPAY--TOO EXPENSIVE FOR PATIENT  Patient enrolled in [place] for free basgalar (med ships to PCP Office due to patient's HD schedule) Current glucose readings: fasting glucose: <155, post prandial glucose: n/a Reports hyperglycemic symptoms Must follow dialysis diet Current exercise: n/a; HD TIW Recommended new start low dose ozempic Assessed patient finances. Will enroll for Ozempic if tolerated  Hyperlipidemia:  New goal. Uncontrolled; current treatment:CRESTOR (ROSUVASTATIN 5MG DAILY);  Current dietary patterns: ON HD 3X WEEKLY; NOT HUGE APPETITE, HOWEVER DOES ENJOY SWEETS, OZEMPIC TO ASSIST Recommended HEART HEALTHY DIET, TAKE STATIN AS PRESCRIBED, NEW START OZEMPIC SHOULD LIKELY HELP WITH LIPID PANEL Lipid Panel     Component Value Date/Time   CHOL 203 (H) 01/31/2021 1001   CHOL 241 (H) 03/25/2013 1026   TRIG 240 (H) 01/31/2021 1001   TRIG 147 10/09/2013 1527   TRIG 209 (H) 03/25/2013 1026   HDL 36 (L) 01/31/2021 1001   HDL 65 10/09/2013 1527   HDL 51 03/25/2013 1026   CHOLHDL 5.6 (H) 01/31/2021 1001   CHOLHDL 3.0 06/17/2008 0625   VLDL 19 06/17/2008 0625   LDLCALC 124 (H) 01/31/2021 1001   LDLCALC 125 (H) 10/09/2013 1527   LDLCALC 148 (H) 03/25/2013 1026   LABVLDL 43 (H) 01/31/2021 1001  Patient Goals/Self-Care Activities patient will:  - take medications as prescribed as evidenced by patient report and record review check glucose DAILY OR IF SYMPTOMATIC, document, and provide at future appointments engage in dietary modifications by HEART HEALTHY/dialysis DIET      Medication Assistance:  symbicort via az&me; will obtain ozempic via novo nordisk if patient continues  Patient's preferred pharmacy is:  [facility], [place] [address] Phone: [phone] Fax: [phone]  [facility] - [place], [place] [address] Phone:  [phone] Fax: [phone]  Uses pill box? No - n/a Pt endorses 100% compliance  Follow Up:  Patient agrees to Care Plan and Follow-up.  Plan: Telephone follow up appointment with care management team member scheduled for:  11/10/21  Regina Eck, PharmD, BCPS Clinical Pharmacist, [place]  II Phone [phone]

## 2021-10-25 NOTE — Patient Instructions (Signed)
Visit Information  Thank you for taking time to visit with me today. Please don't hesitate to contact me if I can be of assistance to you before our next scheduled telephone appointment.  Following are the goals we discussed today:  Current Barriers:  Unable to independently afford treatment regimen Unable to maintain control of T2DM, HLD Suboptimal therapeutic regimen for T2DM  Pharmacist Clinical Goal(s):  patient will verbalize ability to afford treatment regimen achieve control of T2DM as evidenced by GOAL A1C, IMPROVED BLOOD SUGARS WITH STEADY CONTROL through collaboration with PharmD and provider.    Interventions: 1:1 collaboration with [name], MD regarding development and update of comprehensive plan of care as evidenced by provider attestation and co-signature Inter-disciplinary care team collaboration (see longitudinal plan of care) Comprehensive medication review performed; medication list updated in electronic medical record  Diabetes: New goal. Uncontrolled--A1C 7.9%; current treatment:BASAGLAR 20 UNITS, newly started Ozempic 0.25mg  INTOLERANCES:  DID NOT TOLERATE TRULICITY WELL DUE TO GI (EVEN AT LOWEST DOSE) LIMITED DUE TO HD TIW CONTINUE TRIAL OZEMPIC LOW DOSE (50% OF THE 0.25MG  BY DIALING HALFWAY TO 0.25MG  WEEKLY TO START) Denies personal and family history of Medullary thyroid cancer ([place]) Sample given TOLERATING WELL LIBRE NOT COVERED UNDER INSURANCE FOR $0 COPAY--TOO EXPENSIVE FOR PATIENT  Patient enrolled in [place] patient assistance program for free basgalar (med ships to PCP Office due to patient's HD schedule) Current glucose readings: fasting glucose: <155, post prandial glucose: n/a Reports hyperglycemic symptoms Must follow dialysis diet Current exercise: n/a; HD TIW Recommended new start low dose ozempic Assessed patient finances. Will enroll for Ozempic if tolerated  Hyperlipidemia:  New goal. Uncontrolled; current treatment:CRESTOR  (ROSUVASTATIN 5MG  DAILY);  Current dietary patterns: ON HD 3X WEEKLY; NOT HUGE APPETITE, HOWEVER DOES ENJOY SWEETS, OZEMPIC TO ASSIST Recommended HEART HEALTHY DIET, TAKE STATIN AS PRESCRIBED, NEW START OZEMPIC SHOULD LIKELY HELP WITH LIPID PANEL Lipid Panel     Component Value Date/Time   CHOL 203 (H) 01/31/2021 1001   CHOL 241 (H) 03/25/2013 1026   TRIG 240 (H) 01/31/2021 1001   TRIG 147 10/09/2013 1527   TRIG 209 (H) 03/25/2013 1026   HDL 36 (L) 01/31/2021 1001   HDL 65 10/09/2013 1527   HDL 51 03/25/2013 1026   CHOLHDL 5.6 (H) 01/31/2021 1001   CHOLHDL 3.0 06/17/2008 0625   VLDL 19 06/17/2008 0625   LDLCALC 124 (H) 01/31/2021 1001   LDLCALC 125 (H) 10/09/2013 1527   LDLCALC 148 (H) 03/25/2013 1026   LABVLDL 43 (H) 01/31/2021 1001   Patient Goals/Self-Care Activities patient will:  - take medications as prescribed as evidenced by patient report and record review check glucose DAILY OR IF SYMPTOMATIC, document, and provide at future appointments engage in dietary modifications by HEART HEALTHY/dialysis DIET   Our next appointment is by telephone on 12/8 at 3:30pm  Please call the care guide team at [phone] if you need to cancel or reschedule your appointment.    The patient verbalized understanding of instructions, educational materials, and care plan provided today and declined offer to receive copy of patient instructions, educational materials, and care plan.   Signature Regina Eck, PharmD, BCPS Clinical Pharmacist, [place]  II Phone [phone]

## 2021-11-01 DIAGNOSIS — Z992 Dependence on renal dialysis: Secondary | ICD-10-CM | POA: Diagnosis not present

## 2021-11-02 DIAGNOSIS — Z992 Dependence on renal dialysis: Secondary | ICD-10-CM | POA: Diagnosis not present

## 2021-11-02 DIAGNOSIS — H35033 Hypertensive retinopathy, bilateral: Secondary | ICD-10-CM | POA: Diagnosis not present

## 2021-11-02 DIAGNOSIS — N186 End stage renal disease: Secondary | ICD-10-CM | POA: Diagnosis not present

## 2021-11-02 DIAGNOSIS — H04123 Dry eye syndrome of bilateral lacrimal glands: Secondary | ICD-10-CM | POA: Diagnosis not present

## 2021-11-02 DIAGNOSIS — E119 Type 2 diabetes mellitus without complications: Secondary | ICD-10-CM | POA: Diagnosis not present

## 2021-11-02 DIAGNOSIS — H40013 Open angle with borderline findings, low risk, bilateral: Secondary | ICD-10-CM | POA: Diagnosis not present

## 2021-11-02 LAB — HM DIABETES EYE EXAM

## 2021-11-03 DIAGNOSIS — D509 Iron deficiency anemia, unspecified: Secondary | ICD-10-CM | POA: Diagnosis not present

## 2021-11-03 DIAGNOSIS — N2581 Secondary hyperparathyroidism of renal origin: Secondary | ICD-10-CM | POA: Diagnosis not present

## 2021-11-03 DIAGNOSIS — D631 Anemia in chronic kidney disease: Secondary | ICD-10-CM | POA: Diagnosis not present

## 2021-11-03 DIAGNOSIS — N186 End stage renal disease: Secondary | ICD-10-CM | POA: Diagnosis not present

## 2021-11-03 DIAGNOSIS — Z992 Dependence on renal dialysis: Secondary | ICD-10-CM | POA: Diagnosis not present

## 2021-11-04 DIAGNOSIS — Z1231 Encounter for screening mammogram for malignant neoplasm of breast: Secondary | ICD-10-CM | POA: Diagnosis not present

## 2021-11-07 NOTE — Progress Notes (Signed)
Cardiology Office Note   Date:  11/09/2021   ID:  [name], DOB 29-Feb-1951, MRN [id number]  PCP:  [name], MD  Cardiologist:   Minus Breeding, MD   Chief Complaint  Patient presents with   Shortness of Breath        History of Present Illness: [name] is a 70 y.o. female who presents for follow up of CAD and diastolic heart failure with diastolic dysfunction.   She was in the hospital in March with chest pain with non diagnostic troponin.  It was thought to be atypical and she was managed conservatively.     She does have pericardial effusion which was looked at in October and its moderate and unchanged from previous.  She continues to have shortness of breath.  However, its difficult to assess as she complained about this last year when she had a negative perfusion study.  She has had pulmonary function testing with some mild defects.  She really does not think on careful questioning with that it is worse than last year.  She gets dyspneic doing activities such as walking in from the car to her home.  She is not describing PND or orthopnea.  She is not describing new palpitations, presyncope or syncope.  Her volume is managed at dialysis.  She has gained about 20 or 30 pounds though she says she is trying to restrict her calories and eat differently.   Past Medical History:  Diagnosis Date   Acute on chronic diastolic CHF (congestive heart failure) ([place]) 03/05/2015   Grade 2. EF 60-65%   Anemia    hx of   Anxiety    Arthritis    CAD (coronary artery disease)    s/p Stenting in 2005;  [place] 7/09: Vigorous LV function, 2-3+ MR, RI 40%, RI stent patent, proximal-mid RCA 40-50%;  Echo 7/09:  EF 55-65%, trivial MR;  Myoview 11/18/12: EF 66%, normal LV wall motion, mild to moderate anterior ischemia - reviewed by [facility] and felt to rep breast atten (low risk);  Echo 6/14: mild LVH, EF 55-65%, Gr 2 DD, PASP 44, trivial eff    Chronic kidney disease    CREATININE IS  UP--GOING TO KIDNEY MD    Chronic neck pain    COPD (chronic obstructive pulmonary disease) (HCC)    Degenerative joint disease    left shoulder   Diabetic neuropathy (HCC)    Diverticulosis    Esophageal stricture    GERD (gastroesophageal reflux disease)    Hyperlipidemia    Hypertension    IDDM (insulin dependent diabetes mellitus)    Pericardial effusion 03/06/2015   Very small per echo ; pleural nodules seen on CT chest.   Peripheral vascular disease ([place])    Stroke ([place])    "mini- years ago"   Vitamin D deficiency     Past Surgical History:  Procedure Laterality Date   A/V FISTULAGRAM N/A 08/10/2017   Procedure: A/V Fistulagram - Right Arm;  Surgeon: Elam Dutch, MD;  Location: [place] [facility];  Service: Cardiovascular;  Laterality: N/A;   A/V FISTULAGRAM N/A 03/03/2020   Procedure: A/V FISTULAGRAM - Right Arm;  Surgeon: Marty Heck, MD;  Location: [place] [facility];  Service: Cardiovascular;  Laterality: N/A;   ABDOMINAL HYSTERECTOMY     AV FISTULA PLACEMENT Right 07/20/2015   Procedure: RADIAL CEPHALIC ARTERIOVENOUS FISTULA CREATION RIGHT ARM;  Surgeon: Angelia Mould, MD;  Location: [place];  Service: Vascular;  Laterality: Right;   AV FISTULA PLACEMENT Right 11/26/2015   Procedure:  Creation of RIGHT BRACHIO-CEPHALIC arteriovenous fistula;  Surgeon: Angelia Mould, MD;  Location: [facility];  Service: Vascular;  Laterality: Right;   BACK SURGERY     2007 & 2013   [place]   CARDIAC CATHETERIZATION  2003, 2005, 2009   1 stent   cardiac stents     CATARACT EXTRACTION W/PHACO Right 05/04/2014   Procedure: CATARACT EXTRACTION PHACO AND INTRAOCULAR LENS PLACEMENT ([place]);  Surgeon: Williams Che, MD;  Location: [facility];  Service: Ophthalmology;  Laterality: Right;  CDE 1.47   CATARACT EXTRACTION W/PHACO Left 07/20/2014   Procedure: CATARACT EXTRACTION WITH PHACO AND INTRAOCULAR LENS PLACEMENT LEFT EYE CDE=1.79;  Surgeon: Williams Che,  MD;  Location: [facility];  Service: Ophthalmology;  Laterality: Left;   CERVICAL SPINE SURGERY  2012   8 screws   CYSTOSCOPY WITH INJECTION N/A 06/03/2013   Procedure: MACROPLASTIQUE  INJECTION ;  Surgeon: Malka So, MD;  Location: [facility];  Service: Urology;  Laterality: N/A;   FISTULOGRAM Right 10/20/2016   Procedure: FISTULOGRAM WITH POSSIBLE INTERVENTION;  Surgeon: [name], MD;  Location: [facility];  Service: Vascular;  Laterality: Right;   INSERTION OF DIALYSIS CATHETER N/A 11/26/2015   Procedure: INSERTION OF DIALYSIS CATHETER;  Surgeon: Angelia Mould, MD;  Location: [place];  Service: Vascular;  Laterality: N/A;   LAMINECTOMY  12/29/2011   LUMBAR LAMINECTOMY/DECOMPRESSION MICRODISCECTOMY  12/29/2011   Procedure: LUMBAR LAMINECTOMY/DECOMPRESSION MICRODISCECTOMY;  Surgeon: Floyce Stakes, MD;  Location: [place] [facility];  Service: Neurosurgery;  Laterality: N/A;  Lumbar Two through Lumbar Five Laminectomies/Cellsaver   PARTIAL HYSTERECTOMY     PERIPHERAL VASCULAR BALLOON ANGIOPLASTY  03/03/2020   Procedure: PERIPHERAL VASCULAR BALLOON ANGIOPLASTY;  Surgeon: Marty Heck, MD;  Location: [place] [facility];  Service: Cardiovascular;;  Rt arm fistula   PERIPHERAL VASCULAR BALLOON ANGIOPLASTY Right 04/15/2021   Procedure: PERIPHERAL VASCULAR BALLOON ANGIOPLASTY;  Surgeon: Angelia Mould, MD;  Location: [place] [facility];  Service: Cardiovascular;  Laterality: Right;  arm fistula   PERIPHERAL VASCULAR CATHETERIZATION N/A 11/22/2015   Procedure: Fistulagram;  Surgeon: Angelia Mould, MD;  Location: [place] [facility];  Service: Cardiovascular;  Laterality: N/A;   PERIPHERAL VASCULAR INTERVENTION  08/10/2017   Procedure: PERIPHERAL VASCULAR INTERVENTION;  Surgeon: Elam Dutch, MD;  Location: [place] [facility];  Service: Cardiovascular;;   SHOULDER SURGERY Right    Rotator cuff     Current Outpatient Medications  Medication Sig Dispense Refill   acetaminophen  (TYLENOL) 500 MG tablet Take 1,000 mg by mouth every 6 (six) hours as needed for moderate pain or headache.     albuterol (PROVENTIL) (2.5 MG/3ML) 0.083% nebulizer solution Take 3 mLs (2.5 mg total) by nebulization every 6 (six) hours as needed for wheezing or shortness of breath. 75 mL 12   albuterol (VENTOLIN HFA) 108 (90 Base) MCG/ACT inhaler Inhale 2 puffs into the lungs every 6 (six) hours as needed for wheezing or shortness of breath. 1 each 2   aspirin 81 MG EC tablet Take 81 mg by mouth daily.     atenolol (TENORMIN) 50 MG tablet Take 1 tablet (50 mg total) by mouth 2 (two) times daily. 180 tablet 3   busPIRone (BUSPAR) 7.5 MG tablet Take 1 tablet (7.5 mg total) by mouth 3 (three) times daily as needed. 90 tablet 1   calcium acetate (PHOSLO)  667 MG capsule Take 667-1,334 mg by mouth See admin instructions. Take 1334 mg with meals 3 times daily and 667 mg with snacks     Carboxymethylcellul-Glycerin (LUBRICATING EYE DROPS OP) Place 1 drop into both eyes daily as needed (dry eyes).     DULoxetine (CYMBALTA) 20 MG capsule Take by mouth.     gabapentin (NEURONTIN) 400 MG capsule Take 1 capsule (400 mg total) by mouth See admin instructions. Take it after HD 30 capsule 1   Insulin Glargine (BASAGLAR KWIKPEN) 100 UNIT/ML Inject 20 Units into the skin at bedtime. Add five units each time the fasting is over 125 three days in a row. 30 mL 1   montelukast (SINGULAIR) 10 MG tablet Take by mouth.     omeprazole (PRILOSEC) 20 MG capsule Take 1 capsule (20 mg total) by mouth daily. 30 capsule 3   ondansetron (ZOFRAN) 4 MG tablet TAKE 1 TABLET BY MOUTH EVERY 8 HOURS AS NEEDED FOR NAUSEA AND VOMITING 20 tablet 0   QUEtiapine (SEROQUEL) 50 MG tablet Take 1 tablet (50 mg total) by mouth at bedtime. For sleep and for anxiety 90 tablet 3   rOPINIRole (REQUIP) 1 MG tablet Take 1 tablet (1 mg total) by mouth at bedtime. For leg cramps 90 tablet 3   rosuvastatin (CRESTOR) 5 MG tablet Take 1 tablet (5 mg total) by  mouth daily. For cholesterol 90 tablet 3   Semaglutide,0.25 or 0.5MG /DOS, (OZEMPIC, 0.25 OR 0.5 MG/DOSE,) 2 MG/1.5ML SOPN Inject 0.25 mg into the skin.     Budeson-Glycopyrrol-Formoterol (BREZTRI AEROSPHERE) 160-9-4.8 MCG/ACT AERO Inhale 2 puffs into the lungs in the morning and at bedtime. PLEASE FILL 3 MONTH SUPPLY WITH 4 REFILLS (Patient not taking: Reported on 11/09/2021) 5.9 g 11   cetirizine (ZYRTEC) 10 MG tablet Take 1 tablet (10 mg total) by mouth daily as needed for allergies. (Patient not taking: Reported on 11/09/2021) 90 tablet 3   Continuous Blood Gluc Sensor (FREESTYLE LIBRE 2 SENSOR) MISC Use multiple times daily to track glucose to prevent highs and lows as a complication of dialysis Dx:Z99.2, E11.59 2 each 12   fluconazole (DIFLUCAN) 150 MG tablet Take 1 tablet (150 mg total) by mouth every three (3) days as needed. (Patient not taking: Reported on 11/09/2021) 3 tablet 0   glucose blood (ONETOUCH ULTRA) test strip Use to test blood sugar 3 times daily as directed. DX: E11.9 300 each 12   NIFEdipine (PROCARDIA XL/NIFEDICAL XL) 60 MG 24 hr tablet TAKE 1 TABLET BY MOUTH ONCE DAILY (ON  NON-DIALYSIS  DAYS) 90 tablet 3   nitroGLYCERIN (NITROSTAT) 0.4 MG SL tablet Place 1 tablet (0.4 mg total) under the tongue every 5 (five) minutes as needed for chest pain. 25 tablet 11   polyethylene glycol powder (GLYCOLAX/MIRALAX) 17 GM/SCOOP powder Take 17 g by mouth 2 (two) times daily as needed for moderate constipation. For constipation (Patient not taking: Reported on 11/09/2021) 3350 g 5   Current Facility-Administered Medications  Medication Dose Route Frequency Provider Last Rate Last Admin   0.9 %  sodium chloride infusion  250 mL Intravenous PRN Angelia Mould, MD       sodium chloride flush (NS) 0.9 % injection 3 mL  3 mL Intravenous Q12H Angelia Mould, MD       sodium chloride flush (NS) 0.9 % injection 3 mL  3 mL Intravenous PRN Angelia Mould, MD        Allergies:    Azithromycin, Ace inhibitors, Clopidogrel bisulfate,  Codeine, Lisinopril, and Penicillins   ROS:  Please see the history of present illness.   Otherwise, review of systems are positive for none.   All other systems are reviewed and negative.    PHYSICAL EXAM: VS:  BP 138/70   Pulse 68   Ht 5\' 3"  (1.6 m)   Wt 197 lb (89.4 kg)   BMI 34.90 kg/m  , BMI Body mass index is 34.9 kg/m. GENERAL:  Well appearing NECK:  No jugular venous distention, waveform within normal limits, carotid upstroke brisk and symmetric, no bruits, no thyromegaly LUNGS:  Clear to auscultation bilaterally CHEST:  Unremarkable HEART:  PMI not displaced or sustained,S1 and S2 within normal limits, no S3, no S4, no clicks, no rubs, soft apical systolic murmur nonradiating, no diastolic murmurs ABD:  Flat, positive bowel sounds normal in frequency in pitch, no bruits, no rebound, no guarding, no midline pulsatile mass, no hepatomegaly, no splenomegaly, right upper arm fistula with thrill and bruit EXT:  2 plus pulses throughout, no edema, no cyanosis no clubbing   EKG:  EKG is not ordered today.   Recent Labs: 02/19/2021: ALT 21 03/07/2021: TSH 2.450 04/11/2021: Platelets 258.0 04/15/2021: BUN 46; Creatinine, Ser 8.60; Hemoglobin 10.2; Potassium 3.8; Sodium 137    Lipid Panel    Component Value Date/Time   CHOL 203 (H) 01/31/2021 1001   CHOL 241 (H) 03/25/2013 1026   TRIG 240 (H) 01/31/2021 1001   TRIG 147 10/09/2013 1527   TRIG 209 (H) 03/25/2013 1026   HDL 36 (L) 01/31/2021 1001   HDL 65 10/09/2013 1527   HDL 51 03/25/2013 1026   CHOLHDL 5.6 (H) 01/31/2021 1001   CHOLHDL 3.0 06/17/2008 0625   VLDL 19 06/17/2008 0625   LDLCALC 124 (H) 01/31/2021 1001   LDLCALC 125 (H) 10/09/2013 1527   LDLCALC 148 (H) 03/25/2013 1026      Wt Readings from Last 3 Encounters:  11/09/21 197 lb (89.4 kg)  09/15/21 192 lb 12.8 oz (87.5 kg)  08/26/21 190 lb (86.2 kg)      Other studies Reviewed: Additional studies/  records that were reviewed today include:   Labs Review of the above records demonstrates:  Please see elsewhere in the note.     ASSESSMENT AND PLAN:    Chest pain, unspecified type She is not having any further chest pain.  We will manage with secondary risk reduction.   SOB:   This is difficult to sort out.  It does not really sound like it is different from last year.  I do not think is related to the effusion.  I do not suspect ischemia.  We talked at length about this.  The next step might be right and left heart catheterization if it gets worse but I really think it is probably weight and deconditioning.  I will be following up her effusion as below.  She will let us know if it worsens between now and the next visit.   CAD in native artery As above.   Chronic diastolic heart failure (HCC) Volume is managed with dialysis.  Pericardial effusion This was moderate on echo in October.  I will look at this again in March.=    Essential hypertension Her blood pressure is at target.  No change in therapy.   ESRD (end stage renal disease) on dialysis ([place])  DYSLIPIDEMIA:  I was able to find some labs with more recently at dialysis and her LDL was 62.  She will continue the meds  as listed.  Current medicines are reviewed at length with the patient today.  The patient does not have concerns regarding medicines.  The following changes have been made:  None  Labs/ tests ordered today include:  None  Orders Placed This Encounter  Procedures   ECHOCARDIOGRAM COMPLETE     Disposition:   FU with me in March after the echo.   Signed, Minus Breeding, MD  11/09/2021 11:25 AM    [place]

## 2021-11-09 ENCOUNTER — Ambulatory Visit (INDEPENDENT_AMBULATORY_CARE_PROVIDER_SITE_OTHER): Payer: Medicare Other | Admitting: Cardiology

## 2021-11-09 ENCOUNTER — Other Ambulatory Visit: Payer: Self-pay

## 2021-11-09 ENCOUNTER — Encounter: Payer: Self-pay | Admitting: Cardiology

## 2021-11-09 VITALS — BP 138/70 | HR 68 | Ht 63.0 in | Wt 197.0 lb

## 2021-11-09 DIAGNOSIS — I200 Unstable angina: Secondary | ICD-10-CM | POA: Diagnosis not present

## 2021-11-09 DIAGNOSIS — I10 Essential (primary) hypertension: Secondary | ICD-10-CM | POA: Diagnosis not present

## 2021-11-09 DIAGNOSIS — I2510 Atherosclerotic heart disease of native coronary artery without angina pectoris: Secondary | ICD-10-CM

## 2021-11-09 DIAGNOSIS — Z992 Dependence on renal dialysis: Secondary | ICD-10-CM

## 2021-11-09 DIAGNOSIS — I5032 Chronic diastolic (congestive) heart failure: Secondary | ICD-10-CM

## 2021-11-09 DIAGNOSIS — N186 End stage renal disease: Secondary | ICD-10-CM

## 2021-11-09 DIAGNOSIS — R072 Precordial pain: Secondary | ICD-10-CM | POA: Diagnosis not present

## 2021-11-09 DIAGNOSIS — I3139 Other pericardial effusion (noninflammatory): Secondary | ICD-10-CM

## 2021-11-09 MED ORDER — NIFEDIPINE ER OSMOTIC RELEASE 60 MG PO TB24: ORAL_TABLET | ORAL | 3 refills | Status: DC

## 2021-11-09 MED ORDER — NITROGLYCERIN 0.4 MG SL SUBL: 0.4000 mg | SUBLINGUAL_TABLET | SUBLINGUAL | 11 refills | Status: AC | PRN

## 2021-11-09 NOTE — Patient Instructions (Signed)
Medication Instructions:  The current medical regimen is effective;  continue present plan and medications.  *If you need a refill on your cardiac medications before your next appointment, please call your pharmacy*  Testing/Procedures: Your physician has requested that you have an echocardiogram in March 2023.  This will be completed at [facility] and you will be contacted to be scheduled. Echocardiography is a painless test that uses sound waves to create images of your heart. It provides your doctor with information about the size and shape of your heart and how well your heart's chambers and valves are working. This procedure takes approximately one hour. There are no restrictions for this procedure.  Follow-Up: At [facility], you and your health needs are our priority.  As part of our continuing mission to provide you with exceptional heart care, we have created designated Provider Care Teams.  These Care Teams include your primary Cardiologist (physician) and Advanced Practice Providers (APPs -  Physician Assistants and Nurse Practitioners) who all work together to provide you with the care you need, when you need it.  We recommend signing up for the patient portal called "MyChart".  Sign up information is provided on this After Visit Summary.  MyChart is used to connect with patients for Virtual Visits (Telemedicine).  Patients are able to view lab/test results, encounter notes, upcoming appointments, etc.  Non-urgent messages can be sent to your provider as well.   To learn more about what you can do with MyChart, go to [email or web address].    Your next appointment:   4 month(s)  The format for your next appointment:   In Person  Provider:   Minus Breeding, MD    Thank you for choosing [facility]!!

## 2021-11-10 ENCOUNTER — Ambulatory Visit: Payer: Medicare Other | Admitting: Pharmacist

## 2021-11-10 DIAGNOSIS — E1165 Type 2 diabetes mellitus with hyperglycemia: Secondary | ICD-10-CM

## 2021-11-10 DIAGNOSIS — E1169 Type 2 diabetes mellitus with other specified complication: Secondary | ICD-10-CM

## 2021-11-10 DIAGNOSIS — E785 Hyperlipidemia, unspecified: Secondary | ICD-10-CM

## 2021-11-10 DIAGNOSIS — Z794 Long term (current) use of insulin: Secondary | ICD-10-CM

## 2021-11-10 DIAGNOSIS — J449 Chronic obstructive pulmonary disease, unspecified: Secondary | ICD-10-CM

## 2021-11-10 MED ORDER — ONDANSETRON HCL 4 MG PO TABS: ORAL_TABLET | ORAL | 0 refills | Status: DC

## 2021-11-10 MED ORDER — BUDESONIDE-FORMOTEROL FUMARATE 160-4.5 MCG/ACT IN AERO: 2.0000 | INHALATION_SPRAY | Freq: Two times a day (BID) | RESPIRATORY_TRACT | 4 refills | Status: DC

## 2021-11-10 NOTE — Progress Notes (Signed)
Chronic Care Management Pharmacy Note  11/10/2021 Name:  NALLA PURDY MRN:  [id number] DOB:  [birth year]  Summary: T2DM, HLD  Recommendations/Changes made from today's visit: Diabetes: New goal. Uncontrolled--A1C 7.9%; current treatment: BASAGLAR 20 UNITS, Ozempic 0.28m SQ WEEKLY INTOLERANCES:  DID NOT TOLERATE TRULICITY WELL DUE TO GI (EVEN AT LOWEST DOSE) LIMITED DUE TO HD TIW CONTINUE BASAGLAR 25 UNITS DAILY MAY TRANSITION TO TRESIBA IF OZEMPIC APPROVED --EASIER TO GET FROM SAME COMPANY INCREASE OZEMPIC TO 0.25MG SQ WEEKLY Denies personal and family history of Medullary thyroid cancer (MTC) Sample given TOLERATING LOW DOSE OZEMPIC; OCCASIONALLY USES ZOFRAN FOR NAUSEA, BUT HAS OTHER CAUSES OF NAUSEA AS WELL WILL NEED TO SUBMIT TO NOVO [facility]ISK--AWAITING PATIENT FINANCIALS TO SEND IN APPLICATION LIBRE NOT COVERED UNDER INSURANCE FOR $0 COPAY RE-SUBMITTED TO [facility] [facility] [facility] [employer]IA [place] PORTAL TO CHECK ON COVERAGE Patient RE-enrolled in [place] assistance program for free basgalar (med ships to PCP Office due to patient's HD schedule) Current glucose readings: fasting glucose: <155, post prandial glucose: n/a Reports hyperglycemic symptoms Must follow dialysis diet Current exercise: n/a; HD TIW Recommended new start low dose ozempic Assessed patient finances. Will enroll for Ozempic PATIENT ASSISTANCE ONCE PATIENT BRINGS IN FINANCIAL DOCS; APPLICATION SUBMITTED FOR SYMBICORT TO az&ME PATIENT ASSISTANCE--PATIENT DID NOT LIKE BREZTRI  Hyperlipidemia:  New goal. Uncontrolled; current treatment:CRESTOR (ROSUVASTATIN 5MG DAILY);  Current dietary patterns: ON HD 3X WEEKLY; NOT HUGE APPETITE, HOWEVER DOES ENJOY SWEETS, OZEMPIC TO ASSIST Recommended HEART HEALTHY DIET, TAKE STATIN AS PRESCRIBED, NEW START OZEMPIC SHOULD LIKELY HELP WITH LIPID PANEL Lipid Panel     Component Value Date/Time   CHOL 203 (H) 01/31/2021 1001   CHOL 241 (H) 03/25/2013 1026   TRIG  240 (H) 01/31/2021 1001   TRIG 147 10/09/2013 1527   TRIG 209 (H) 03/25/2013 1026   HDL 36 (L) 01/31/2021 1001   HDL 65 10/09/2013 1527   HDL 51 03/25/2013 1026   CHOLHDL 5.6 (H) 01/31/2021 1001   CHOLHDL 3.0 06/17/2008 0625   VLDL 19 06/17/2008 0625   LDLCALC 124 (H) 01/31/2021 1001   LDLCALC 125 (H) 10/09/2013 1527   LDLCALC 148 (H) 03/25/2013 1026   LABVLDL 43 (H) 01/31/2021 1001   Patient Goals/Self-Care Activities patient will:  - take medications as prescribed as evidenced by patient report and record review check glucose DAILY OR IF SYMPTOMATIC, document, and provide at future appointments engage in dietary modifications by HEART HEALTHY/dialysis DIET   Plan: 1 MONTH  Subjective: SMYCAH MCDOUGALLis an 70y.o. year old female who is a primary patient of [name], WCletus Gash MD.  The CCM team was consulted for assistance with disease management and care coordination needs.    Engaged with patient face to face for follow up visit in response to provider referral for pharmacy case management and/or care coordination services.   Consent to Services:  The patient was given information about Chronic Care Management services, agreed to services, and gave verbal consent prior to initiation of services.  Please see initial visit note for detailed documentation.   Patient Care Team: SClaretta Fraise MD as PCP - General (Family Medicine) HMinus Breeding MD as PCP - Cardiology (Cardiology) BFran Lowes MD (Inactive) as Consulting Physician (Nephrology) SClaretta Fraise MD as Referring Physician (Family Medicine) HMinus Breeding MD as Consulting Physician (Cardiology) [facility] [facility] KDelice Bison RN as Case Manager Analiza Cowger, JRoyce Macadamia [facility](Pharmacist) [facility] [facility] [place]  Objective:  Lab Results  Component Value Date   CREATININE 8.60 (H) 04/15/2021   CREATININE 9.62 (H) 02/19/2021   CREATININE 10.35 (HH) 01/31/2021     Lab Results  Component Value Date   HGBA1C 7.9 (H) 02/20/2021   Last diabetic Eye exam:  Lab Results  Component Value Date/Time   HMDIABEYEEXA No Retinopathy 11/02/2021 12:00 AM    Last diabetic Foot exam: No results found for: HMDIABFOOTEX      Component Value Date/Time   CHOL 203 (H) 01/31/2021 1001   CHOL 241 (H) 03/25/2013 1026   TRIG 240 (H) 01/31/2021 1001   TRIG 147 10/09/2013 1527   TRIG 209 (H) 03/25/2013 1026   HDL 36 (L) 01/31/2021 1001   HDL 65 10/09/2013 1527   HDL 51 03/25/2013 1026   CHOLHDL 5.6 (H) 01/31/2021 1001   CHOLHDL 3.0 06/17/2008 0625   VLDL 19 06/17/2008 0625   LDLCALC 124 (H) 01/31/2021 1001   LDLCALC 125 (H) 10/09/2013 1527   LDLCALC 148 (H) 03/25/2013 1026    Hepatic Function Latest Ref Rng & Units 02/19/2021 01/31/2021 11/01/2020  Total Protein 6.5 - 8.1 g/dL 6.8 6.3 6.5  Albumin 3.5 - 5.0 g/dL 3.3(L) 3.9 4.3  AST 15 - 41 U/L '17 13 10  ' ALT 0 - 44 U/L '21 12 13  ' Alk Phosphatase 38 - 126 U/L 87 96 123(H)  Total Bilirubin 0.3 - 1.2 mg/dL 0.2(L) 0.2 0.2  Bilirubin, Direct 0.0 - 0.2 mg/dL <0.1 - -    Lab Results  Component Value Date/Time   TSH 2.450 03/07/2021 12:32 PM   TSH 2.261 03/05/2015 08:48 AM    CBC Latest Ref Rng & Units 04/15/2021 04/11/2021 02/20/2021  WBC 4.0 - 10.5 K/uL - 11.5(H) 11.3(H)  Hemoglobin 12.0 - 15.0 g/dL 10.2(L) 9.5(L) 9.3(L)  Hematocrit 36.0 - 46.0 % 30.0(L) 28.9(L) 30.0(L)  Platelets 150.0 - 400.0 K/uL - 258.0 205    Lab Results  Component Value Date/Time   VD25OH 26.1 (L) 10/09/2013 03:27 PM   VD25OH 36 03/25/2013 10:26 AM    Clinical ASCVD: No  The ASCVD Risk score (Arnett DK, et al., 2019) failed to calculate for the following reasons:   The valid total cholesterol range is 130 to 320 mg/dL    Other: (CHADS2VASc if Afib, PHQ9 if depression, MMRC or CAT for COPD, ACT, DEXA)  Social History   Tobacco Use  Smoking Status Former   Packs/day: 1.00   Years: 30.00   Pack years: 30.00   Types:  Cigarettes   Quit date: 12/04/2001   Years since quitting: 19.9  Smokeless Tobacco Never   BP Readings from Last 3 Encounters:  11/09/21 138/70  09/15/21 (!) 152/65  08/26/21 127/72   Pulse Readings from Last 3 Encounters:  11/09/21 68  09/15/21 84  08/26/21 80   Wt Readings from Last 3 Encounters:  11/09/21 197 lb (89.4 kg)  09/15/21 192 lb 12.8 oz (87.5 kg)  08/26/21 190 lb (86.2 kg)    Assessment: Review of patient past medical history, allergies, medications, health status, including review of consultants reports, laboratory and other test data, was performed as part of comprehensive evaluation and provision of chronic care management services.   SDOH:  (Social Determinants of Health) assessments and interventions performed:    CCM Care Plan  Allergies  Allergen Reactions   Azithromycin     Prolonged QT   Ace Inhibitors Cough   Clopidogrel Bisulfate Other (See Comments)    Sick, Headache, "Felt terrible"   Codeine Other (See Comments)  hallucinations   Lisinopril Cough   Penicillins Other (See Comments)    Unknown reaction Did it involve swelling of the face/tongue/throat, SOB, or low BP? Unknown Did it involve sudden or severe rash/hives, skin peeling, or any reaction on the inside of your mouth or nose? Unknown Did you need to seek medical attention at a hospital or doctor's office? Unknown When did it last happen?      childhood allergy If all above answers are "NO", may proceed with cephalosporin use.      Medications Reviewed Today     Reviewed by Lavera Guise, [facility] (Pharmacist) on 11/15/21 at 1141  Med List Status: <None>   Medication Order Taking? Sig Documenting Provider Last Dose Status Informant  0.9 %  sodium chloride infusion [id number]   Angelia Mould, MD  Active   acetaminophen (TYLENOL) 500 MG tablet [id number] No Take 1,000 mg by mouth every 6 (six) hours as needed for moderate pain or headache. [provider] Taking Active  Self  albuterol (PROVENTIL) (2.5 MG/3ML) 0.083% nebulizer solution [id number] No Take 3 mLs (2.5 mg total) by nebulization every 6 (six) hours as needed for wheezing or shortness of breath. Hassell Done Mary-Margaret, FNP Taking Active Self  albuterol (VENTOLIN HFA) 108 (90 Base) MCG/ACT inhaler [id number] No Inhale 2 puffs into the lungs every 6 (six) hours as needed for wheezing or shortness of breath. Parrett, Fonnie Mu, NP Taking Active Self  aspirin 81 MG EC tablet [id number] No Take 81 mg by mouth daily. [provider] Taking Active Self  atenolol (TENORMIN) 50 MG tablet [id number] No Take 1 tablet (50 mg total) by mouth 2 (two) times daily. [name], MD Taking Active   budesonide-formoterol [facility]) 160-4.5 MCG/ACT inhaler [id number] Yes Inhale 2 puffs into the lungs 2 (two) times daily. [name], MD  Active            Med Note Lottie Dawson D   Tue Nov 15, 2021 11:39 AM) VIA AZ&ME PATIENT ASSISTANCE PROGRAM--ESCRIBED TO [facility] PER PATIENT ASSISTANCE PROGRAM PHARMACY   busPIRone (BUSPAR) 7.5 MG tablet [id number] No Take 1 tablet (7.5 mg total) by mouth 3 (three) times daily as needed. [name], MD Taking Active   calcium acetate (PHOSLO) 667 MG capsule [id number] No Take 667-1,334 mg by mouth See admin instructions. Take 1334 mg with meals 3 times daily and 667 mg with snacks [provider] Taking Active Self  Carboxymethylcellul-Glycerin (LUBRICATING EYE DROPS OP) [id number] No Place 1 drop into both eyes daily as needed (dry eyes). [provider] Taking Active Self  cetirizine (ZYRTEC) 10 MG tablet [id number] No Take 1 tablet (10 mg total) by mouth daily as needed for allergies.  Patient not taking: Reported on 11/09/2021   [name], MD Not Taking Active   Continuous Blood Gluc Sensor (FREESTYLE LIBRE 2 SENSOR) [place] [id number]  Use multiple times daily to track glucose to prevent highs and lows as a complication of dialysis Dx:Z99.2, E11.59 [name], MD  Active            Med Note Lavera Guise   Tue Nov 15, 2021 11:39 AM) [facility] [facility]IA [place] PORTAL ON 11/15/21  DULoxetine (CYMBALTA) 20 MG capsule [id number] No Take by mouth. [provider] Taking Active   fluconazole (DIFLUCAN) 150 MG tablet [id number] No Take 1 tablet (150 mg total) by mouth every three (3) days as needed.  Patient not taking: Reported on 11/09/2021   Sharion Balloon, FNP Not  Taking Active   gabapentin (NEURONTIN) 400 MG capsule [id number] No Take 1 capsule (400 mg total) by mouth See admin instructions. Take it after HD Barton Dubois, MD Taking Active Self  glucose blood (ONETOUCH ULTRA) test strip [id number] No Use to test blood sugar 3 times daily as directed. DX: E11.9 [name], MD Taking Active Self  Insulin Glargine [facility] KWIKPEN) 100 UNIT/ML [id number] No Inject 20 Units into the skin at bedtime. Add five units each time the fasting is over 125 three days in a row.  Patient taking differently: Inject 25 Units into the skin at bedtime. Add five units each time the fasting is over 125 three days in a row.   [name], MD Taking Active            Med Note Blanca Friend, Marcellus Scott Oct 19, 2021  9:09 AM) Via [facility] patient assistance program  montelukast (SINGULAIR) 10 MG tablet [id number] No Take by mouth. [provider] Taking Active   NIFEdipine (PROCARDIA XL/NIFEDICAL XL) 60 MG 24 hr tablet [id number]  TAKE 1 TABLET BY MOUTH ONCE DAILY (ON  NON-DIALYSIS  DAYS) Minus Breeding, MD  Active   nitroGLYCERIN (NITROSTAT) 0.4 MG SL tablet [id number]  Place 1 tablet (0.4 mg total) under the tongue every 5 (five) minutes as needed for chest pain. Minus Breeding, MD  Active   omeprazole (PRILOSEC) 20 MG capsule [id number] No Take 1 capsule (20 mg total) by mouth daily. Ivy Lynn, NP Taking Active   ondansetron (ZOFRAN) 4 MG tablet [id number]  TAKE 1 TABLET BY MOUTH EVERY 8 HOURS AS NEEDED FOR NAUSEA AND VOMITING  [name], MD  Active   Patient not taking:  Discontinued 11/15/21 1141 (Completed Course)   QUEtiapine (SEROQUEL) 50 MG tablet [id number] No Take 1 tablet (50 mg total) by mouth at bedtime. For sleep and for anxiety [name], MD Taking Active   rOPINIRole (REQUIP) 1 MG tablet [id number] No Take 1 tablet (1 mg total) by mouth at bedtime. For leg cramps [name], MD Taking Active   rosuvastatin (CRESTOR) 5 MG tablet [id number] No Take 1 tablet (5 mg total) by mouth daily. For cholesterol [name], MD Taking Active            Med Note Gentry Roch   Thu Apr 14, 2021  3:20 PM)    Semaglutide,0.25 or 0.5MG/DOS, (OZEMPIC, 0.25 OR 0.5 MG/DOSE,) 2 MG/1.5ML SOPN [id number] No Inject 0.25 mg into the skin. [provider] Taking Active            Med Note Blanca Friend, Royce Macadamia   Fri Sep 30, 2021  2:35 PM) Sample given  sodium chloride flush (NS) 0.9 % injection 3 mL [id number]   Angelia Mould, MD  Active   sodium chloride flush (NS) 0.9 % injection 3 mL [id number]   Angelia Mould, MD  Active             Patient Active Problem List   Diagnosis Date Noted   Nausea 08/26/2021   Chest pain 02/19/2021   Hypertension    Hyperlipidemia    Type 2 diabetes mellitus with hyperglycemia ([place])    Hypoalbuminemia    Bruit 01/05/2020   Educated about COVID-19 virus infection 01/05/2020   End stage renal disease on dialysis due to type 2 diabetes mellitus ([place]) 07/21/2019   Lung nodule 05/01/2018   Prolonged Q-T interval on ECG 09/06/2017   Coronary artery disease involving native coronary artery of  native heart without angina pectoris 09/06/2017   GAD (generalized anxiety disorder) 03/04/2016   Leg pain 01/07/2016   Anemia in chronic kidney disease 12/10/2015   Dependence on renal dialysis ([place]) 12/10/2015   End stage renal disease ([place]) 12/10/2015   Osteopathy in diseases classified elsewhere, unspecified site 12/10/2015   Constipation 10/25/2015    Chronic obstructive pulmonary disease ([place]) 03/06/2015   Pericardial effusion 03/06/2015   Chronic diastolic CHF (congestive heart failure) ([place]) 03/05/2015   PAD (peripheral artery disease) ([place]) 04/21/2014   Dyspnea 09/29/2013   Obesity 12/05/2011   Diverticulosis 03/29/2011   Diabetic neuropathy ([place]) 03/29/2011   Esophageal stricture 03/29/2011   Vitamin D deficiency 03/29/2011   Degenerative joint disease of left shoulder 03/29/2011   Neck pain 03/29/2011   Type II diabetes mellitus with nephropathy ([place]) 04/19/2009   Hyperlipidemia associated with type 2 diabetes mellitus ([place]) 04/19/2009   Hypertension associated with diabetes ([place]) 04/19/2009   Coronary atherosclerosis 04/19/2009    Immunization History  Administered Date(s) Administered   DTaP 07/19/2010   Hepatitis B, adult 12/04/2015, 01/04/2016, 02/08/2016, 06/01/2016, 10/17/2016, 11/11/2016, 12/16/2016, 04/12/2017, 04/08/2019   Influenza Split 10/04/2013, 09/14/2015   Influenza Whole 09/03/2006   Influenza, Seasonal, Injecte, Preservative Fre 08/24/2011, 09/25/2012   Influenza,inj,Quad PF,6+ Mos 09/11/2013, 09/09/2014   Influenza-Unspecified 09/14/2015, 08/25/2016, 08/21/2019, 09/09/2020   Moderna SARS-COV2 Booster Vaccination 11/03/2020   Moderna Sars-Covid-2 Vaccination 02/09/2020, 03/11/2020   PPD Test 11/23/2015, 12/26/2016, 01/03/2018, 01/09/2019, 04/20/2020, 03/31/2021   Pneumococcal Conjugate-13 07/07/2017   Pneumococcal Polysaccharide-23 08/26/2012, 07/07/2017, 12/13/2018   Pneumococcal-Unspecified 08/26/2012, 07/07/2017   Td 07/19/2010   Tdap 08/24/2011   Zoster Recombinat (Shingrix) 09/09/2019, 11/10/2019    Conditions to be addressed/monitored: HTN, HLD, DMII, and ESRD  Care Plan : PHARMD MEDICATION MANAGEMENT  Updates made by Lavera Guise, RPH since 11/15/2021 12:00 AM     Problem: DISEASE PROGRESSION PREVENTION      Long-Range Goal: T2DM, HLD   Recent Progress: Not on track  Priority:  High  Note:   Current Barriers:  Unable to independently afford treatment regimen Unable to maintain control of T2DM, HLD Suboptimal therapeutic regimen for T2DM  Pharmacist Clinical Goal(s):  patient will verbalize ability to afford treatment regimen achieve control of T2DM as evidenced by GOAL A1C, IMPROVED BLOOD SUGARS WITH STEADY CONTROL  through collaboration with PharmD and provider.    Interventions: 1:1 collaboration with [name], MD regarding development and update of comprehensive plan of care as evidenced by provider attestation and co-signature Inter-disciplinary care team collaboration (see longitudinal plan of care) Comprehensive medication review performed; medication list updated in electronic medical record  Diabetes: New goal. Uncontrolled--A1C 7.9%; current treatment: BASAGLAR 20-25 UNITS, Ozempic 0.59m SQ WEEKLY INTOLERANCES:  DID NOT TOLERATE TRULICITY WELL DUE TO GI (EVEN AT LOWEST DOSE) LIMITED DUE TO HD TIW INCREASE OZEMPIC TO 0.25MG SQ WEEKLY Denies personal and family history of Medullary thyroid cancer (MTC) Sample given TOLERATING LOW DOSE OZEMPIC; OCCASIONALLY USES ZOFRAN FOR NAUSEA, BUT HAS OTHER CAUSES OF NAUSEA AS WELL WILL NEED TO SUBMIT TO [employer]K--AWAITING PATIENT FINANCIALS TO SEND IN ABarstowNOT COVERED UNDER INSURANCE FOR $0 COPAY RE-SUBMITTED TO ARothsayPatient RE-enrolled in [place] program for free basgalar (med ships to PCP Office due to patient's HD schedule) Current glucose readings: fasting glucose: <155, post prandial glucose: n/a Reports hyperglycemic symptoms Must follow dialysis diet Current exercise: n/a; HD TIW Recommended new start low dose ozempic Assessed patient  finances. Will enroll for Ozempic PATIENT ASSISTANCE ONCE PATIENT BRINGS IN FINANCIAL DOCS; APPLICATION SUBMITTED FOR SYMBICORT TO az&ME PATIENT ASSISTANCE--PATIENT  DID NOT LIKE BREZTRI  Hyperlipidemia:  New goal. Uncontrolled; current treatment:CRESTOR (ROSUVASTATIN 5MG DAILY);  Current dietary patterns: ON HD 3X WEEKLY; NOT HUGE APPETITE, HOWEVER DOES ENJOY SWEETS, OZEMPIC TO ASSIST Recommended HEART HEALTHY DIET, TAKE STATIN AS PRESCRIBED, NEW START OZEMPIC SHOULD LIKELY HELP WITH LIPID PANEL Lipid Panel     Component Value Date/Time   CHOL 203 (H) 01/31/2021 1001   CHOL 241 (H) 03/25/2013 1026   TRIG 240 (H) 01/31/2021 1001   TRIG 147 10/09/2013 1527   TRIG 209 (H) 03/25/2013 1026   HDL 36 (L) 01/31/2021 1001   HDL 65 10/09/2013 1527   HDL 51 03/25/2013 1026   CHOLHDL 5.6 (H) 01/31/2021 1001   CHOLHDL 3.0 06/17/2008 0625   VLDL 19 06/17/2008 0625   LDLCALC 124 (H) 01/31/2021 1001   LDLCALC 125 (H) 10/09/2013 1527   LDLCALC 148 (H) 03/25/2013 1026   LABVLDL 43 (H) 01/31/2021 1001  Patient Goals/Self-Care Activities patient will:  - take medications as prescribed as evidenced by patient report and record review check glucose DAILY OR IF SYMPTOMATIC, document, and provide at future appointments engage in dietary modifications by HEART HEALTHY/dialysis DIET      Medication Assistance: Application for SYMBICORT/AZ&ME AND OZEMPIC/NOVO [place] PENDING  medication assistance program. in process.  Anticipated assistance start date TBD FOR 2023.  See plan of care for additional detail.  Patient's preferred pharmacy is:  [facility] [address], [address] [place] [address] Phone: [phone] Fax: [phone]  [facility] - [place], [place] [address] [place] [address] Phone: [phone] Fax: [phone]  [facility] [place], [place] - [address] [place] [address] Phone: [phone] Fax: [phone]  Uses pill box? No - N/A Pt endorses 100% compliance  Follow Up:  Patient agrees to  Care Plan and Follow-up.  Plan: Telephone follow up appointment with care management team member scheduled for:  1 MONTH    Regina Eck, PharmD, BCPS Clinical Pharmacist, [place]  II Phone [phone]

## 2021-11-15 MED ORDER — BUDESONIDE-FORMOTEROL FUMARATE 160-4.5 MCG/ACT IN AERO
2.0000 | INHALATION_SPRAY | Freq: Two times a day (BID) | RESPIRATORY_TRACT | 4 refills | Status: DC
Start: 2021-11-15 — End: 2022-05-03

## 2021-11-15 MED ORDER — BASAGLAR KWIKPEN 100 UNIT/ML ~~LOC~~ SOPN
25.0000 [IU] | PEN_INJECTOR | Freq: Every day | SUBCUTANEOUS | 11 refills | Status: DC
Start: 2021-11-15 — End: 2022-05-03

## 2021-11-15 MED ORDER — FREESTYLE LIBRE 2 READER DEVI
2 refills | Status: AC
Start: 2021-11-15 — End: ?

## 2021-11-15 NOTE — Patient Instructions (Signed)
Visit Information  Thank you for taking time to visit with me today. Please don't hesitate to contact me if I can be of assistance to you before our next scheduled telephone appointment.  Following are the goals we discussed today:    Diabetes: New goal. Uncontrolled--A1C 7.9%; current treatment: BASAGLAR 20 UNITS, Ozempic 0.25mg  SQ WEEKLY INTOLERANCES:  DID NOT TOLERATE TRULICITY WELL DUE TO GI (EVEN AT LOWEST DOSE) LIMITED DUE TO HD TIW INCREASE OZEMPIC TO 0.25MG  SQ WEEKLY Denies personal and family history of Medullary thyroid cancer (MTC) Sample given TOLERATING LOW DOSE OZEMPIC; OCCASIONALLY USES ZOFRAN FOR NAUSEA, BUT HAS OTHER CAUSES OF NAUSEA AS WELL WILL NEED TO SUBMIT TO NOVO [employer]ISK--AWAITING PATIENT FINANCIALS TO SEND IN APPLICATION LIBRE NOT COVERED UNDER INSURANCE FOR $0 COPAY RE-SUBMITTED TO [employer] VIA [place] PORTAL TO CHECK ON COVERAGE Patient RE-enrolled in [place] patient assistance program for free basgalar (med ships to PCP Office due to patient's HD schedule) Current glucose readings: fasting glucose: <155, post prandial glucose: n/a Reports hyperglycemic symptoms Must follow dialysis diet Current exercise: n/a; HD TIW Recommended new start low dose ozempic Assessed patient finances. Will enroll for Ozempic PATIENT ASSISTANCE ONCE PATIENT BRINGS IN FINANCIAL DOCS; APPLICATION SUBMITTED FOR SYMBICORT TO az&ME PATIENT ASSISTANCE--PATIENT DID NOT LIKE BREZTRI  Hyperlipidemia:  New goal. Uncontrolled; current treatment:CRESTOR (ROSUVASTATIN 5MG  DAILY);  Current dietary patterns: ON HD 3X WEEKLY; NOT HUGE APPETITE, HOWEVER DOES ENJOY SWEETS, OZEMPIC TO ASSIST Recommended HEART HEALTHY DIET, TAKE STATIN AS PRESCRIBED, NEW START OZEMPIC SHOULD LIKELY HELP WITH LIPID PANEL Lipid Panel     Component Value Date/Time   CHOL 203 (H) 01/31/2021 1001   CHOL 241 (H) 03/25/2013 1026   TRIG 240 (H) 01/31/2021 1001   TRIG 147 10/09/2013 1527   TRIG  209 (H) 03/25/2013 1026   HDL 36 (L) 01/31/2021 1001   HDL 65 10/09/2013 1527   HDL 51 03/25/2013 1026   CHOLHDL 5.6 (H) 01/31/2021 1001   CHOLHDL 3.0 06/17/2008 0625   VLDL 19 06/17/2008 0625   LDLCALC 124 (H) 01/31/2021 1001   LDLCALC 125 (H) 10/09/2013 1527   LDLCALC 148 (H) 03/25/2013 1026   LABVLDL 43 (H) 01/31/2021 1001   Patient Goals/Self-Care Activities patient will:  - take medications as prescribed as evidenced by patient report and record review check glucose DAILY OR IF SYMPTOMATIC, document, and provide at future appointments engage in dietary modifications by HEART HEALTHY/dialysis DIET   Please call the care guide team at [phone] if you need to cancel or reschedule your appointment.   The patient verbalized understanding of instructions, educational materials, and care plan provided today and declined offer to receive copy of patient instructions, educational materials, and care plan.     Regina Eck, PharmD, BCPS Clinical Pharmacist, [place]  II Phone [phone]

## 2021-11-29 DIAGNOSIS — Z794 Long term (current) use of insulin: Secondary | ICD-10-CM | POA: Diagnosis not present

## 2021-11-29 DIAGNOSIS — I259 Chronic ischemic heart disease, unspecified: Secondary | ICD-10-CM | POA: Diagnosis not present

## 2021-11-29 DIAGNOSIS — Z992 Dependence on renal dialysis: Secondary | ICD-10-CM | POA: Diagnosis not present

## 2021-11-29 DIAGNOSIS — E119 Type 2 diabetes mellitus without complications: Secondary | ICD-10-CM | POA: Diagnosis not present

## 2021-12-02 ENCOUNTER — Telehealth: Payer: Self-pay | Admitting: Family Medicine

## 2021-12-02 ENCOUNTER — Ambulatory Visit (INDEPENDENT_AMBULATORY_CARE_PROVIDER_SITE_OTHER): Payer: Medicare Other | Admitting: Pharmacist

## 2021-12-02 DIAGNOSIS — E1121 Type 2 diabetes mellitus with diabetic nephropathy: Secondary | ICD-10-CM

## 2021-12-02 DIAGNOSIS — J441 Chronic obstructive pulmonary disease with (acute) exacerbation: Secondary | ICD-10-CM

## 2021-12-02 MED ORDER — ALBUTEROL SULFATE (2.5 MG/3ML) 0.083% IN NEBU: 2.5000 mg | INHALATION_SOLUTION | Freq: Four times a day (QID) | RESPIRATORY_TRACT | 12 refills | Status: AC | PRN

## 2021-12-02 MED ORDER — ALBUTEROL SULFATE HFA 108 (90 BASE) MCG/ACT IN AERS: 2.0000 | INHALATION_SPRAY | Freq: Four times a day (QID) | RESPIRATORY_TRACT | 2 refills | Status: DC | PRN

## 2021-12-02 NOTE — Progress Notes (Signed)
Chronic Care Management Pharmacy Note  12/02/2021 Name:  [name] MRN:  [id number] DOB:  04/21/1951  Summary: t2dm, hld  Recommendations/Changes made from today's visit: Current Barriers:  Unable to independently afford treatment regimen Unable to maintain control of T2DM, HLD Suboptimal therapeutic regimen for T2DM  Pharmacist Clinical Goal(s):  patient will verbalize ability to afford treatment regimen achieve control of T2DM as evidenced by GOAL A1C, IMPROVED BLOOD SUGARS WITH STEADY CONTROL through collaboration with PharmD and provider.    Interventions: 1:1 collaboration with [name], MD regarding development and update of comprehensive plan of care as evidenced by provider attestation and co-signature Inter-disciplinary care team collaboration (see longitudinal plan of care) Comprehensive medication review performed; medication list updated in electronic medical record  Diabetes: New goal. Uncontrolled--A1C 7.9%; current treatment: BASAGLAR 20-25 UNITS, Ozempic 0.88m SQ WEEKLY INTOLERANCES:  DID NOT TOLERATE TRULICITY WELL DUE TO GI (EVEN AT LOWEST DOSE) T2DM REGIMEN LIMITED DUE TO HD TIW CONTINUE OZEMPIC TO 0.25MG SQ WEEKLY Denies personal and family history of Medullary thyroid cancer (MTC) Sample given TOLERATING LOW DOSE OZEMPIC; OCCASIONALLY USES ZOFRAN FOR NAUSEA, BUT HAS OTHER CAUSES OF NAUSEA AS WELL Application submitted [place] ozem[place] patient assistance LIBRE COVERED UNDER INSURANCE FOR $0 COPAY RE-SUBMITTED TO [place] COVERAGE--ADS and [place] libre order (need to d/c one of them)-->will follow up on 12/08/21 Libre 2 CGM education provided Patient to use phone as reader--set up lSperry2 app Left arm (since graft on right side) Patient RE-enrolled in LAssurantpatient assistance program for free basgalar (med ships to PCP Office due to patient's HD schedule) Current glucose  readings: fasting glucose: <155, post prandial glucose: n/a Reports hyperglycemic symptoms Must follow dialysis diet Current exercise: n/a; HD TIW Recommended continue low dose ozempic; new libre start Assessed patient finances. Submitted for Ozempic PATIENT ASSISTANCE via novo nordisk; APPLICATION SUBMITTED FOR SYMBICORT TO az&ME PATIENT ASSISTANCE--PATIENT DID NOT LIKE BREZTRI  Hyperlipidemia:  New goal. Uncontrolled; current treatment:CRESTOR (ROSUVASTATIN 5MG DAILY);  Current dietary patterns: ON HD 3X WEEKLY; NOT HUGE APPETITE, HOWEVER DOES ENJOY SWEETS, OZEMPIC TO ASSIST Recommended HEART HEALTHY DIET, TAKE STATIN AS PRESCRIBED, NEW START OZEMPIC SHOULD LIKELY HELP WITH LIPID PANEL Lipid Panel     Component Value Date/Time   CHOL 203 (H) 01/31/2021 1001   CHOL 241 (H) 03/25/2013 1026   TRIG 240 (H) 01/31/2021 1001   TRIG 147 10/09/2013 1527   TRIG 209 (H) 03/25/2013 1026   HDL 36 (L) 01/31/2021 1001   HDL 65 10/09/2013 1527   HDL 51 03/25/2013 1026   CHOLHDL 5.6 (H) 01/31/2021 1001   CHOLHDL 3.0 06/17/2008 0625   VLDL 19 06/17/2008 0625   LDLCALC 124 (H) 01/31/2021 1001   LDLCALC 125 (H) 10/09/2013 1527   LDLCALC 148 (H) 03/25/2013 1026   LABVLDL 43 (H) 01/31/2021 1001   Patient Goals/Self-Care Activities patient will:  - take medications as prescribed as evidenced by patient report and record review check glucose DAILY OR IF SYMPTOMATIC, document, and provide at future appointments engage in dietary modifications by HEART HEALTHY/dialysis DIET   Plan: 12/08/21  Subjective: Tamara MIZRACHIis an 70y.o. year old female who is a primary patient of [name], WCletus Gash MD.  The CCM team was consulted for assistance with disease management and care coordination needs.    Engaged with patient face to face for follow up visit in response to provider referral for pharmacy case management  and/or care coordination services.   Consent to Services:  The patient was given information  about Chronic Care Management services, agreed to services, and gave verbal consent prior to initiation of services.  Please see initial visit note for detailed documentation.   Patient Care Team: [name], MD as PCP - General (Family Medicine) Minus Breeding, MD as PCP - Cardiology (Cardiology) Fran Lowes, MD (Inactive) as Consulting Physician (Nephrology) [name], MD as Referring Physician (Family Medicine) Minus Breeding, MD as Consulting Physician (Cardiology) [place], [facility], Delice Bison, RN as Case Manager Lavera Guise, [facility] (Pharmacist) [place], [place] [facility]  Objective:  Lab Results  Component Value Date   CREATININE 8.60 (H) 04/15/2021   CREATININE 9.62 (H) 02/19/2021   CREATININE 10.35 ([place]) 01/31/2021    Lab Results  Component Value Date   HGBA1C 7.9 (H) 02/20/2021   Last diabetic Eye exam:  Lab Results  Component Value Date/Time   HMDIABEYEEXA No Retinopathy 11/02/2021 12:00 AM    Last diabetic Foot exam: No results found for: HMDIABFOOTEX      Component Value Date/Time   CHOL 203 (H) 01/31/2021 1001   CHOL 241 (H) 03/25/2013 1026   TRIG 240 (H) 01/31/2021 1001   TRIG 147 10/09/2013 1527   TRIG 209 (H) 03/25/2013 1026   HDL 36 (L) 01/31/2021 1001   HDL 65 10/09/2013 1527   HDL 51 03/25/2013 1026   CHOLHDL 5.6 (H) 01/31/2021 1001   CHOLHDL 3.0 06/17/2008 0625   VLDL 19 06/17/2008 0625   LDLCALC 124 (H) 01/31/2021 1001   LDLCALC 125 (H) 10/09/2013 1527   LDLCALC 148 (H) 03/25/2013 1026    Hepatic Function Latest Ref Rng & Units 02/19/2021 01/31/2021 11/01/2020  Total Protein 6.5 - 8.1 g/dL 6.8 6.3 6.5  Albumin 3.5 - 5.0 g/dL 3.3(L) 3.9 4.3  AST 15 - 41 U/L '17 13 10  ' ALT 0 - 44 U/L '21 12 13  ' Alk Phosphatase 38 - 126 U/L 87 96 123(H)  Total Bilirubin 0.3 - 1.2 mg/dL 0.2(L) 0.2 0.2  Bilirubin, Direct 0.0 - 0.2 mg/dL <0.1 - -    Lab Results  Component Value Date/Time   TSH  2.450 03/07/2021 12:32 PM   TSH 2.261 03/05/2015 08:48 AM    CBC Latest Ref Rng & Units 04/15/2021 04/11/2021 02/20/2021  WBC 4.0 - 10.5 K/uL - 11.5(H) 11.3(H)  Hemoglobin 12.0 - 15.0 g/dL 10.2(L) 9.5(L) 9.3(L)  Hematocrit 36.0 - 46.0 % 30.0(L) 28.9(L) 30.0(L)  Platelets 150.0 - 400.0 K/uL - 258.0 205    Lab Results  Component Value Date/Time   VD25OH 26.1 (L) 10/09/2013 03:27 PM   VD25OH 36 03/25/2013 10:26 AM    Clinical ASCVD: No  The ASCVD Risk score (Arnett DK, et al., 2019) failed to calculate for the following reasons:   The valid total cholesterol range is 130 to 320 mg/dL    Other: (CHADS2VASc if Afib, PHQ9 if depression, MMRC or CAT for COPD, ACT, DEXA)  Social History   Tobacco Use  Smoking Status Former   Packs/day: 1.00   Years: 30.00   Pack years: 30.00   Types: Cigarettes   Quit date: 12/04/2001   Years since quitting: 20.0  Smokeless Tobacco Never   BP Readings from Last 3 Encounters:  11/09/21 138/70  09/15/21 (!) 152/65  08/26/21 127/72   Pulse Readings from Last 3 Encounters:  11/09/21 68  09/15/21 84  08/26/21 80   Wt Readings from Last 3  Encounters:  11/09/21 197 lb (89.4 kg)  09/15/21 192 lb 12.8 oz (87.5 kg)  08/26/21 190 lb (86.2 kg)    Assessment: Review of patient past medical history, allergies, medications, health status, including review of consultants reports, laboratory and other test data, was performed as part of comprehensive evaluation and provision of chronic care management services.   SDOH:  (Social Determinants of Health) assessments and interventions performed:    CCM Care Plan  Allergies  Allergen Reactions   Azithromycin     Prolonged QT   Ace Inhibitors Cough   Clopidogrel Bisulfate Other (See Comments)    Sick, Headache, "Felt terrible"   Codeine Other (See Comments)    hallucinations   Lisinopril Cough   Penicillins Other (See Comments)    Unknown reaction Did it involve swelling of the face/tongue/throat,  SOB, or low BP? Unknown Did it involve sudden or severe rash/hives, skin peeling, or any reaction on the inside of your mouth or nose? Unknown Did you need to seek medical attention at a hospital or doctor's office? Unknown When did it last happen?      childhood allergy If all above answers are "NO", may proceed with cephalosporin use.      Medications Reviewed Today     Reviewed by Lavera Guise, [facility] (Pharmacist) on 12/02/21 at 1105  Med List Status: <None>   Medication Order Taking? Sig Documenting Provider Last Dose Status Informant  0.9 %  sodium chloride infusion [id number]   Angelia Mould, MD  Active   acetaminophen (TYLENOL) 500 MG tablet [id number] No Take 1,000 mg by mouth every 6 (six) hours as needed for moderate pain or headache. [provider] Taking Active Self  albuterol (PROVENTIL) (2.5 MG/3ML) 0.083% nebulizer solution [id number] No Take 3 mLs (2.5 mg total) by nebulization every 6 (six) hours as needed for wheezing or shortness of breath. Hassell Done Mary-Margaret, FNP Taking Active Self  albuterol (VENTOLIN HFA) 108 (90 Base) MCG/ACT inhaler [id number] No Inhale 2 puffs into the lungs every 6 (six) hours as needed for wheezing or shortness of breath. Parrett, Fonnie Mu, NP Taking Active Self  aspirin 81 MG EC tablet [id number] No Take 81 mg by mouth daily. [provider] Taking Active Self  atenolol (TENORMIN) 50 MG tablet [id number] No Take 1 tablet (50 mg total) by mouth 2 (two) times daily. [name], MD Taking Active   budesonide-formoterol [facility]) 160-4.5 MCG/ACT inhaler [id number]  Inhale 2 puffs into the lungs 2 (two) times daily. [name], MD  Active   busPIRone (BUSPAR) 7.5 MG tablet [id number] No Take 1 tablet (7.5 mg total) by mouth 3 (three) times daily as needed. [name], MD Taking Active   calcium acetate (PHOSLO) 667 MG capsule [id number] No Take 667-1,334 mg by mouth See admin instructions. Take 1334 mg with meals 3 times  daily and 667 mg with snacks [provider] Taking Active Self  Carboxymethylcellul-Glycerin (LUBRICATING EYE DROPS OP) [id number] No Place 1 drop into both eyes daily as needed (dry eyes). [provider] Taking Active Self  cetirizine (ZYRTEC) 10 MG tablet [id number] No Take 1 tablet (10 mg total) by mouth daily as needed for allergies.  Patient not taking: Reported on 11/09/2021   [name], MD Not Taking Active   Continuous Blood Gluc Receiver (FREESTYLE LIBRE 2 READER) Kerrin Mo [id number]  USE TO TEST BLOOD SUGAR Liborio Nixon, MD  Active   Continuous Blood Gluc Sensor (FREESTYLE LIBRE 2 SENSOR) [place] [id number]  Use multiple  times daily to track glucose to prevent highs and lows as a complication of dialysis Dx:Z99.2, E11.59 [name], MD  Active            Med Note Lavera Guise   Tue Nov 15, 2021 11:39 AM) ADVANCED DIABETES SUPPLY VIA PARACHUTE PORTAL ON 11/15/21  DULoxetine (CYMBALTA) 20 MG capsule [id number] No Take by mouth. [provider] Taking Active   gabapentin (NEURONTIN) 400 MG capsule [id number] No Take 1 capsule (400 mg total) by mouth See admin instructions. Take it after HD Barton Dubois, MD Taking Active Self  glucose blood (ONETOUCH ULTRA) test strip [id number] No Use to test blood sugar 3 times daily as directed. DX: E11.9 [name], MD Taking Active Self  Insulin Glargine [facility] KWIKPEN) 100 UNIT/ML [id number]  Inject 25 Units into the skin at bedtime. Add five units each time the fasting is over 125 three days in a row. [name], MD  Active            Med Note Blanca Friend, Shadoe Cryan D   Tue Nov 15, 2021 11:45 AM) VIA LILLY CARES PATIENT ASSISTANCE PROGRAM--ESCRIBE CHANGES OR REFILLS TO RX [facility] [place] PER PROGRAM   montelukast (SINGULAIR) 10 MG tablet [id number] No Take by mouth. [provider] Taking Active   NIFEdipine (PROCARDIA XL/NIFEDICAL XL) 60 MG 24 hr tablet [id number]  TAKE 1 TABLET BY  MOUTH ONCE DAILY (ON  NON-DIALYSIS  DAYS) Minus Breeding, MD  Active   nitroGLYCERIN (NITROSTAT) 0.4 MG SL tablet [id number]  Place 1 tablet (0.4 mg total) under the tongue every 5 (five) minutes as needed for chest pain. Minus Breeding, MD  Active   omeprazole (PRILOSEC) 20 MG capsule [id number] No Take 1 capsule (20 mg total) by mouth daily. Ivy Lynn, NP Taking Active   ondansetron (ZOFRAN) 4 MG tablet [id number]  TAKE 1 TABLET BY MOUTH EVERY 8 HOURS AS NEEDED FOR NAUSEA AND VOMITING [name], MD  Active   QUEtiapine (SEROQUEL) 50 MG tablet [id number] No Take 1 tablet (50 mg total) by mouth at bedtime. For sleep and for anxiety [name], MD Taking Active   rOPINIRole (REQUIP) 1 MG tablet [id number] No Take 1 tablet (1 mg total) by mouth at bedtime. For leg cramps [name], MD Taking Active   rosuvastatin (CRESTOR) 5 MG tablet [id number] No Take 1 tablet (5 mg total) by mouth daily. For cholesterol [name], MD Taking Active            Med Note Gentry Roch   Thu Apr 14, 2021  3:20 PM)    Semaglutide,0.25 or 0.5MG/DOS, (OZEMPIC, 0.25 OR 0.5 MG/DOSE,) 2 MG/1.5ML SOPN [id number] No Inject 0.25 mg into the skin. [provider] Taking Active            Med Note Blanca Friend, Royce Macadamia   Fri Sep 30, 2021  2:35 PM) Sample given  sodium chloride flush (NS) 0.9 % injection 3 mL [id number]   Angelia Mould, MD  Active   sodium chloride flush (NS) 0.9 % injection 3 mL [id number]   Angelia Mould, MD  Active             Patient Active Problem List   Diagnosis Date Noted   Nausea 08/26/2021   Chest pain 02/19/2021   Hypertension    Hyperlipidemia    Type 2 diabetes mellitus with hyperglycemia ([place])    Hypoalbuminemia    Bruit 01/05/2020   Educated about COVID-19 virus infection 01/05/2020  End stage renal disease on dialysis due to type 2 diabetes mellitus ([place]) 07/21/2019   Lung nodule 05/01/2018   Prolonged Q-T interval on ECG 09/06/2017    Coronary artery disease involving native coronary artery of native heart without angina pectoris 09/06/2017   GAD (generalized anxiety disorder) 03/04/2016   Leg pain 01/07/2016   Anemia in chronic kidney disease 12/10/2015   Dependence on renal dialysis ([place]) 12/10/2015   End stage renal disease ([place]) 12/10/2015   Osteopathy in diseases classified elsewhere, unspecified site 12/10/2015   Constipation 10/25/2015   Chronic obstructive pulmonary disease ([place]) 03/06/2015   Pericardial effusion 03/06/2015   Chronic diastolic CHF (congestive heart failure) ([place]) 03/05/2015   PAD (peripheral artery disease) ([place]) 04/21/2014   Dyspnea 09/29/2013   Obesity 12/05/2011   Diverticulosis 03/29/2011   Diabetic neuropathy ([place]) 03/29/2011   Esophageal stricture 03/29/2011   Vitamin D deficiency 03/29/2011   Degenerative joint disease of left shoulder 03/29/2011   Neck pain 03/29/2011   Type II diabetes mellitus with nephropathy ([place]) 04/19/2009   Hyperlipidemia associated with type 2 diabetes mellitus ([place]) 04/19/2009   Hypertension associated with diabetes ([place]) 04/19/2009   Coronary atherosclerosis 04/19/2009    Immunization History  Administered Date(s) Administered   DTaP 07/19/2010   Hepatitis B, adult 12/04/2015, 01/04/2016, 02/08/2016, 06/01/2016, 10/17/2016, 11/11/2016, 12/16/2016, 04/12/2017, 04/08/2019   Influenza Split 10/04/2013, 09/14/2015   Influenza Whole 09/03/2006   Influenza, Seasonal, Injecte, Preservative Fre 08/24/2011, 09/25/2012   Influenza,inj,Quad PF,6+ Mos 09/11/2013, 09/09/2014   Influenza-Unspecified 09/14/2015, 08/25/2016, 08/21/2019, 09/09/2020   Moderna SARS-COV2 Booster Vaccination 11/03/2020   Moderna Sars-Covid-2 Vaccination 02/09/2020, 03/11/2020   PPD Test 11/23/2015, 12/26/2016, 01/03/2018, 01/09/2019, 04/20/2020, 03/31/2021   Pneumococcal Conjugate-13 07/07/2017   Pneumococcal Polysaccharide-23 08/26/2012, 07/07/2017, 12/13/2018    Pneumococcal-Unspecified 08/26/2012, 07/07/2017   Td 07/19/2010   Tdap 08/24/2011   Zoster Recombinat (Shingrix) 09/09/2019, 11/10/2019    Conditions to be addressed/monitored: HTN, HLD, DMII, and ESRD  Care Plan : PHARMD MEDICATION MANAGEMENT  Updates made by Lavera Guise, RPH since 12/05/2021 12:00 AM     Problem: DISEASE PROGRESSION PREVENTION      Long-Range Goal: T2DM, HLD   Recent Progress: Not on track  Priority: High  Note:   Current Barriers:  Unable to independently afford treatment regimen Unable to maintain control of T2DM, HLD Suboptimal therapeutic regimen for T2DM  Pharmacist Clinical Goal(s):  patient will verbalize ability to afford treatment regimen achieve control of T2DM as evidenced by GOAL A1C, IMPROVED BLOOD SUGARS WITH STEADY CONTROL  through collaboration with PharmD and provider.    Interventions: 1:1 collaboration with [name], MD regarding development and update of comprehensive plan of care as evidenced by provider attestation and co-signature Inter-disciplinary care team collaboration (see longitudinal plan of care) Comprehensive medication review performed; medication list updated in electronic medical record  Diabetes: New goal. Uncontrolled--A1C 7.9%; current treatment: BASAGLAR 20-25 UNITS, Ozempic 0.35m SQ WEEKLY INTOLERANCES:  DID NOT TOLERATE TRULICITY WELL DUE TO GI (EVEN AT LOWEST DOSE) T2DM REGIMEN LIMITED DUE TO HD TIW CONTINUE OZEMPIC TO 0.25MG SQ WEEKLY Denies personal and family history of Medullary thyroid cancer (MTC) Sample given TOLERATING LOW DOSE OZEMPIC; OCCASIONALLY USES ZOFRAN FOR NAUSEA, BUT HAS OTHER CAUSES OF NAUSEA AS WELL Application submitted to [place]for ozempic patient assistance LIBRE COVERED UNDER INSURANCE FOR $0 COPAY RE-SUBMITTED TO [place] CHECK ON COVERAGE--ADS and [place] libre order (need to d/c one of them)-->will follow up on  12/08/21 LElenor Legato  2 CGM education provided Patient to use phone as reader--set up [place] 2 app Left arm (since graft on right side) Patient RE-enrolled in [employer] patient assistance program for free basgalar (med ships to PCP Office due to patient's HD schedule) Current glucose readings: fasting glucose: <155, post prandial glucose: n/a Reports hyperglycemic symptoms Must follow dialysis diet Current exercise: n/a; HD TIW Recommended continue low dose ozempic; new libre start Assessed patient finances. Submitted for Ozempic PATIENT ASSISTANCE via novo nordisk; APPLICATION SUBMITTED FOR SYMBICORT TO az&ME PATIENT ASSISTANCE--PATIENT DID NOT LIKE BREZTRI  Hyperlipidemia:  New goal. Uncontrolled; current treatment:CRESTOR (ROSUVASTATIN 5MG DAILY);  Current dietary patterns: ON HD 3X WEEKLY; NOT HUGE APPETITE, HOWEVER DOES ENJOY SWEETS, OZEMPIC TO ASSIST Recommended HEART HEALTHY DIET, TAKE STATIN AS PRESCRIBED, NEW START OZEMPIC SHOULD LIKELY HELP WITH LIPID PANEL Lipid Panel     Component Value Date/Time   CHOL 203 (H) 01/31/2021 1001   CHOL 241 (H) 03/25/2013 1026   TRIG 240 (H) 01/31/2021 1001   TRIG 147 10/09/2013 1527   TRIG 209 (H) 03/25/2013 1026   HDL 36 (L) 01/31/2021 1001   HDL 65 10/09/2013 1527   HDL 51 03/25/2013 1026   CHOLHDL 5.6 (H) 01/31/2021 1001   CHOLHDL 3.0 06/17/2008 0625   VLDL 19 06/17/2008 0625   LDLCALC 124 (H) 01/31/2021 1001   LDLCALC 125 (H) 10/09/2013 1527   LDLCALC 148 (H) 03/25/2013 1026   LABVLDL 43 (H) 01/31/2021 1001  Patient Goals/Self-Care Activities patient will:  - take medications as prescribed as evidenced by patient report and record review check glucose DAILY OR IF SYMPTOMATIC, document, and provide at future appointments engage in dietary modifications by HEART HEALTHY/dialysis DIET      Medication Assistance:  basaglar via lilly, symbicort via az&me; ozempic pending with novo nordisk  Patient's preferred pharmacy is:  [place] [address], [place] [address] [place] [address] Phone: [phone] Fax: [place], [place] [address] [place] [address] Phone: [phone] Fax: [phone]  [facility] [place], [place] - [address] [place] [address] Phone: [phone] Fax: [phone]  Uses pill box? No - n/a Pt endorses 100% compliance  Follow Up:  Patient agrees to Care Plan and Follow-up.  Plan: Telephone follow up appointment with care management team member scheduled for:  12/08/21  Regina Eck, PharmD, BCPS Clinical Pharmacist, [place]  II Phone [phone]

## 2021-12-03 DIAGNOSIS — N186 End stage renal disease: Secondary | ICD-10-CM | POA: Diagnosis not present

## 2021-12-03 DIAGNOSIS — Z992 Dependence on renal dialysis: Secondary | ICD-10-CM | POA: Diagnosis not present

## 2021-12-05 DIAGNOSIS — Z794 Long term (current) use of insulin: Secondary | ICD-10-CM

## 2021-12-05 DIAGNOSIS — T380X5A Adverse effect of glucocorticoids and synthetic analogues, initial encounter: Secondary | ICD-10-CM | POA: Diagnosis present

## 2021-12-05 DIAGNOSIS — E876 Hypokalemia: Secondary | ICD-10-CM | POA: Diagnosis not present

## 2021-12-05 DIAGNOSIS — D638 Anemia in other chronic diseases classified elsewhere: Secondary | ICD-10-CM | POA: Diagnosis not present

## 2021-12-05 DIAGNOSIS — E559 Vitamin D deficiency, unspecified: Secondary | ICD-10-CM | POA: Diagnosis present

## 2021-12-05 DIAGNOSIS — Z833 Family history of diabetes mellitus: Secondary | ICD-10-CM

## 2021-12-05 DIAGNOSIS — E1151 Type 2 diabetes mellitus with diabetic peripheral angiopathy without gangrene: Secondary | ICD-10-CM | POA: Diagnosis present

## 2021-12-05 DIAGNOSIS — D72829 Elevated white blood cell count, unspecified: Secondary | ICD-10-CM | POA: Diagnosis not present

## 2021-12-05 DIAGNOSIS — Z955 Presence of coronary angioplasty implant and graft: Secondary | ICD-10-CM

## 2021-12-05 DIAGNOSIS — Z992 Dependence on renal dialysis: Secondary | ICD-10-CM | POA: Diagnosis not present

## 2021-12-05 DIAGNOSIS — I132 Hypertensive heart and chronic kidney disease with heart failure and with stage 5 chronic kidney disease, or end stage renal disease: Secondary | ICD-10-CM | POA: Diagnosis not present

## 2021-12-05 DIAGNOSIS — N186 End stage renal disease: Secondary | ICD-10-CM | POA: Diagnosis not present

## 2021-12-05 DIAGNOSIS — F419 Anxiety disorder, unspecified: Secondary | ICD-10-CM | POA: Diagnosis present

## 2021-12-05 DIAGNOSIS — E669 Obesity, unspecified: Secondary | ICD-10-CM | POA: Diagnosis not present

## 2021-12-05 DIAGNOSIS — R0600 Dyspnea, unspecified: Secondary | ICD-10-CM | POA: Diagnosis not present

## 2021-12-05 DIAGNOSIS — E1140 Type 2 diabetes mellitus with diabetic neuropathy, unspecified: Secondary | ICD-10-CM | POA: Diagnosis present

## 2021-12-05 DIAGNOSIS — Z809 Family history of malignant neoplasm, unspecified: Secondary | ICD-10-CM

## 2021-12-05 DIAGNOSIS — I5032 Chronic diastolic (congestive) heart failure: Secondary | ICD-10-CM | POA: Diagnosis not present

## 2021-12-05 DIAGNOSIS — R778 Other specified abnormalities of plasma proteins: Secondary | ICD-10-CM | POA: Diagnosis not present

## 2021-12-05 DIAGNOSIS — E1165 Type 2 diabetes mellitus with hyperglycemia: Secondary | ICD-10-CM | POA: Diagnosis not present

## 2021-12-05 DIAGNOSIS — E782 Mixed hyperlipidemia: Secondary | ICD-10-CM | POA: Diagnosis not present

## 2021-12-05 DIAGNOSIS — Z8673 Personal history of transient ischemic attack (TIA), and cerebral infarction without residual deficits: Secondary | ICD-10-CM

## 2021-12-05 DIAGNOSIS — N2581 Secondary hyperparathyroidism of renal origin: Secondary | ICD-10-CM | POA: Diagnosis present

## 2021-12-05 DIAGNOSIS — J9601 Acute respiratory failure with hypoxia: Secondary | ICD-10-CM | POA: Diagnosis present

## 2021-12-05 DIAGNOSIS — R0602 Shortness of breath: Secondary | ICD-10-CM | POA: Diagnosis not present

## 2021-12-05 DIAGNOSIS — Z79899 Other long term (current) drug therapy: Secondary | ICD-10-CM

## 2021-12-05 DIAGNOSIS — G8929 Other chronic pain: Secondary | ICD-10-CM | POA: Diagnosis present

## 2021-12-05 DIAGNOSIS — K219 Gastro-esophageal reflux disease without esophagitis: Secondary | ICD-10-CM | POA: Diagnosis present

## 2021-12-05 DIAGNOSIS — I2510 Atherosclerotic heart disease of native coronary artery without angina pectoris: Secondary | ICD-10-CM | POA: Diagnosis not present

## 2021-12-05 DIAGNOSIS — M7989 Other specified soft tissue disorders: Secondary | ICD-10-CM | POA: Diagnosis present

## 2021-12-05 DIAGNOSIS — I5031 Acute diastolic (congestive) heart failure: Secondary | ICD-10-CM | POA: Diagnosis not present

## 2021-12-05 DIAGNOSIS — Z20822 Contact with and (suspected) exposure to covid-19: Secondary | ICD-10-CM | POA: Diagnosis not present

## 2021-12-05 DIAGNOSIS — J441 Chronic obstructive pulmonary disease with (acute) exacerbation: Secondary | ICD-10-CM | POA: Diagnosis not present

## 2021-12-05 DIAGNOSIS — Z8249 Family history of ischemic heart disease and other diseases of the circulatory system: Secondary | ICD-10-CM

## 2021-12-05 DIAGNOSIS — Z90711 Acquired absence of uterus with remaining cervical stump: Secondary | ICD-10-CM

## 2021-12-05 DIAGNOSIS — Z7982 Long term (current) use of aspirin: Secondary | ICD-10-CM

## 2021-12-05 DIAGNOSIS — J449 Chronic obstructive pulmonary disease, unspecified: Secondary | ICD-10-CM | POA: Diagnosis not present

## 2021-12-05 DIAGNOSIS — I5033 Acute on chronic diastolic (congestive) heart failure: Secondary | ICD-10-CM | POA: Diagnosis present

## 2021-12-05 DIAGNOSIS — I517 Cardiomegaly: Secondary | ICD-10-CM | POA: Diagnosis not present

## 2021-12-05 DIAGNOSIS — I248 Other forms of acute ischemic heart disease: Secondary | ICD-10-CM | POA: Diagnosis not present

## 2021-12-05 DIAGNOSIS — Z885 Allergy status to narcotic agent status: Secondary | ICD-10-CM

## 2021-12-05 DIAGNOSIS — N250 Renal osteodystrophy: Secondary | ICD-10-CM | POA: Diagnosis not present

## 2021-12-05 DIAGNOSIS — Z7951 Long term (current) use of inhaled steroids: Secondary | ICD-10-CM

## 2021-12-05 DIAGNOSIS — Z888 Allergy status to other drugs, medicaments and biological substances status: Secondary | ICD-10-CM

## 2021-12-05 DIAGNOSIS — E1122 Type 2 diabetes mellitus with diabetic chronic kidney disease: Secondary | ICD-10-CM | POA: Diagnosis not present

## 2021-12-05 DIAGNOSIS — Z881 Allergy status to other antibiotic agents status: Secondary | ICD-10-CM

## 2021-12-05 DIAGNOSIS — Z87891 Personal history of nicotine dependence: Secondary | ICD-10-CM

## 2021-12-05 DIAGNOSIS — Z6834 Body mass index (BMI) 34.0-34.9, adult: Secondary | ICD-10-CM

## 2021-12-05 DIAGNOSIS — R7989 Other specified abnormal findings of blood chemistry: Secondary | ICD-10-CM | POA: Diagnosis present

## 2021-12-05 DIAGNOSIS — D631 Anemia in chronic kidney disease: Secondary | ICD-10-CM | POA: Diagnosis present

## 2021-12-05 DIAGNOSIS — I509 Heart failure, unspecified: Secondary | ICD-10-CM | POA: Diagnosis not present

## 2021-12-05 NOTE — Patient Instructions (Signed)
Visit Information  Thank you for taking time to visit with me today. Please don't hesitate to contact me if I can be of assistance to you before our next scheduled telephone appointment.  Following are the goals we discussed today:  Current Barriers:  Unable to independently afford treatment regimen Unable to maintain control of T2DM, HLD Suboptimal therapeutic regimen for T2DM  Pharmacist Clinical Goal(s):  patient will verbalize ability to afford treatment regimen achieve control of T2DM as evidenced by GOAL A1C, IMPROVED BLOOD SUGARS WITH STEADY CONTROL through collaboration with PharmD and provider.    Interventions: 1:1 collaboration with [name], MD regarding development and update of comprehensive plan of care as evidenced by provider attestation and co-signature Inter-disciplinary care team collaboration (see longitudinal plan of care) Comprehensive medication review performed; medication list updated in electronic medical record  Diabetes: New goal. Uncontrolled--A1C 7.9%; current treatment: BASAGLAR 20-25 UNITS, Ozempic 0.25mg  SQ WEEKLY INTOLERANCES:  DID NOT TOLERATE TRULICITY WELL DUE TO GI (EVEN AT LOWEST DOSE) T2DM REGIMEN LIMITED DUE TO HD TIW CONTINUE OZEMPIC TO 0.25MG  SQ WEEKLY Denies personal and family history of Medullary thyroid cancer (MTC) Sample given TOLERATING LOW DOSE OZEMPIC; OCCASIONALLY USES ZOFRAN FOR NAUSEA, BUT HAS OTHER CAUSES OF NAUSEA AS WELL Application submitted to [employer] for ozempic patient assistance LIBRE COVERED UNDER INSURANCE FOR $0 COPAY RE-SUBMITTED TO [employer]ET[employer] SUPPLY VIA PARACHUTE PORTAL TO CHECK ON COVERAGE--ADS and [place] fulfilled libre order (need to d/c one of them)-->will follow up on 12/08/21 Libre 2 CGM education provided Patient to use phone as reader--set up [place] 2 app Left arm (since graft on right side) Patient RE-enrolled in [employer] patient assistance program for free basgalar (med ships to PCP Office  due to patient's HD schedule) Current glucose readings: fasting glucose: <155, post prandial glucose: n/a Reports hyperglycemic symptoms Must follow dialysis diet Current exercise: n/a; HD TIW Recommended continue low dose ozempic; new libre start Assessed patient finances. Submitted for Ozempic PATIENT ASSISTANCE via novo nordisk; APPLICATION SUBMITTED FOR SYMBICORT TO az&ME PATIENT ASSISTANCE--PATIENT DID NOT LIKE BREZTRI  Hyperlipidemia:  New goal. Uncontrolled; current treatment:CRESTOR (ROSUVASTATIN 5MG  DAILY);  Current dietary patterns: ON HD 3X WEEKLY; NOT HUGE APPETITE, HOWEVER DOES ENJOY SWEETS, OZEMPIC TO ASSIST Recommended HEART HEALTHY DIET, TAKE STATIN AS PRESCRIBED, NEW START OZEMPIC SHOULD LIKELY HELP WITH LIPID PANEL Lipid Panel     Component Value Date/Time   CHOL 203 (H) 01/31/2021 1001   CHOL 241 (H) 03/25/2013 1026   TRIG 240 (H) 01/31/2021 1001   TRIG 147 10/09/2013 1527   TRIG 209 (H) 03/25/2013 1026   HDL 36 (L) 01/31/2021 1001   HDL 65 10/09/2013 1527   HDL 51 03/25/2013 1026   CHOLHDL 5.6 (H) 01/31/2021 1001   CHOLHDL 3.0 06/17/2008 0625   VLDL 19 06/17/2008 0625   LDLCALC 124 (H) 01/31/2021 1001   LDLCALC 125 (H) 10/09/2013 1527   LDLCALC 148 (H) 03/25/2013 1026   LABVLDL 43 (H) 01/31/2021 1001   Patient Goals/Self-Care Activities patient will:  - take medications as prescribed as evidenced by patient report and record review check glucose DAILY OR IF SYMPTOMATIC, document, and provide at future appointments engage in dietary modifications by HEART HEALTHY/dialysis DIET   Please call the care guide team at [phone] if you need to cancel or reschedule your appointment.    The patient verbalized understanding of instructions, educational materials, and care plan provided today and declined offer to receive copy of patient instructions, educational materials, and care plan.  Signature Regina Eck, PharmD, BCPS Clinical Pharmacist,  [place]  II Phone [phone]

## 2021-12-06 ENCOUNTER — Inpatient Hospital Stay (HOSPITAL_COMMUNITY): Payer: Medicare Other

## 2021-12-06 ENCOUNTER — Other Ambulatory Visit (HOSPITAL_COMMUNITY): Payer: Self-pay | Admitting: *Deleted

## 2021-12-06 ENCOUNTER — Encounter (HOSPITAL_COMMUNITY): Payer: Self-pay | Admitting: Emergency Medicine

## 2021-12-06 ENCOUNTER — Inpatient Hospital Stay (HOSPITAL_COMMUNITY)
Admission: EM | Admit: 2021-12-06 | Discharge: 2021-12-10 | DRG: 291 | Disposition: A | Discharge status: Home / Self Care | Payer: Medicare Other | Attending: Internal Medicine | Admitting: Internal Medicine

## 2021-12-06 ENCOUNTER — Emergency Department (HOSPITAL_COMMUNITY): Payer: Medicare Other

## 2021-12-06 ENCOUNTER — Other Ambulatory Visit: Payer: Self-pay

## 2021-12-06 DIAGNOSIS — Z20822 Contact with and (suspected) exposure to covid-19: Secondary | ICD-10-CM | POA: Diagnosis present

## 2021-12-06 DIAGNOSIS — I509 Heart failure, unspecified: Secondary | ICD-10-CM

## 2021-12-06 DIAGNOSIS — D638 Anemia in other chronic diseases classified elsewhere: Secondary | ICD-10-CM | POA: Diagnosis not present

## 2021-12-06 DIAGNOSIS — D72829 Elevated white blood cell count, unspecified: Secondary | ICD-10-CM

## 2021-12-06 DIAGNOSIS — I517 Cardiomegaly: Secondary | ICD-10-CM | POA: Diagnosis not present

## 2021-12-06 DIAGNOSIS — F419 Anxiety disorder, unspecified: Secondary | ICD-10-CM | POA: Diagnosis present

## 2021-12-06 DIAGNOSIS — I132 Hypertensive heart and chronic kidney disease with heart failure and with stage 5 chronic kidney disease, or end stage renal disease: Secondary | ICD-10-CM | POA: Diagnosis present

## 2021-12-06 DIAGNOSIS — R7989 Other specified abnormal findings of blood chemistry: Secondary | ICD-10-CM

## 2021-12-06 DIAGNOSIS — K219 Gastro-esophageal reflux disease without esophagitis: Secondary | ICD-10-CM

## 2021-12-06 DIAGNOSIS — I5033 Acute on chronic diastolic (congestive) heart failure: Secondary | ICD-10-CM | POA: Diagnosis present

## 2021-12-06 DIAGNOSIS — R778 Other specified abnormalities of plasma proteins: Secondary | ICD-10-CM

## 2021-12-06 DIAGNOSIS — N186 End stage renal disease: Secondary | ICD-10-CM | POA: Diagnosis present

## 2021-12-06 DIAGNOSIS — E1165 Type 2 diabetes mellitus with hyperglycemia: Secondary | ICD-10-CM | POA: Diagnosis present

## 2021-12-06 DIAGNOSIS — J9601 Acute respiratory failure with hypoxia: Secondary | ICD-10-CM | POA: Diagnosis present

## 2021-12-06 DIAGNOSIS — N250 Renal osteodystrophy: Secondary | ICD-10-CM | POA: Diagnosis not present

## 2021-12-06 DIAGNOSIS — I2510 Atherosclerotic heart disease of native coronary artery without angina pectoris: Secondary | ICD-10-CM | POA: Diagnosis present

## 2021-12-06 DIAGNOSIS — J441 Chronic obstructive pulmonary disease with (acute) exacerbation: Secondary | ICD-10-CM

## 2021-12-06 DIAGNOSIS — Z992 Dependence on renal dialysis: Secondary | ICD-10-CM | POA: Diagnosis not present

## 2021-12-06 DIAGNOSIS — J449 Chronic obstructive pulmonary disease, unspecified: Secondary | ICD-10-CM

## 2021-12-06 DIAGNOSIS — R0602 Shortness of breath: Secondary | ICD-10-CM | POA: Diagnosis present

## 2021-12-06 DIAGNOSIS — E1122 Type 2 diabetes mellitus with diabetic chronic kidney disease: Secondary | ICD-10-CM | POA: Diagnosis present

## 2021-12-06 DIAGNOSIS — G8929 Other chronic pain: Secondary | ICD-10-CM | POA: Diagnosis present

## 2021-12-06 DIAGNOSIS — E559 Vitamin D deficiency, unspecified: Secondary | ICD-10-CM | POA: Diagnosis present

## 2021-12-06 DIAGNOSIS — E1151 Type 2 diabetes mellitus with diabetic peripheral angiopathy without gangrene: Secondary | ICD-10-CM | POA: Diagnosis present

## 2021-12-06 DIAGNOSIS — E1140 Type 2 diabetes mellitus with diabetic neuropathy, unspecified: Secondary | ICD-10-CM | POA: Diagnosis present

## 2021-12-06 DIAGNOSIS — E782 Mixed hyperlipidemia: Secondary | ICD-10-CM | POA: Diagnosis present

## 2021-12-06 DIAGNOSIS — I248 Other forms of acute ischemic heart disease: Secondary | ICD-10-CM | POA: Diagnosis present

## 2021-12-06 DIAGNOSIS — Z8673 Personal history of transient ischemic attack (TIA), and cerebral infarction without residual deficits: Secondary | ICD-10-CM | POA: Diagnosis not present

## 2021-12-06 DIAGNOSIS — E876 Hypokalemia: Secondary | ICD-10-CM | POA: Diagnosis not present

## 2021-12-06 DIAGNOSIS — I5032 Chronic diastolic (congestive) heart failure: Secondary | ICD-10-CM | POA: Diagnosis not present

## 2021-12-06 DIAGNOSIS — I200 Unstable angina: Secondary | ICD-10-CM | POA: Diagnosis present

## 2021-12-06 DIAGNOSIS — I10 Essential (primary) hypertension: Secondary | ICD-10-CM

## 2021-12-06 DIAGNOSIS — R0600 Dyspnea, unspecified: Secondary | ICD-10-CM | POA: Diagnosis not present

## 2021-12-06 DIAGNOSIS — D631 Anemia in chronic kidney disease: Secondary | ICD-10-CM | POA: Diagnosis present

## 2021-12-06 DIAGNOSIS — E669 Obesity, unspecified: Secondary | ICD-10-CM

## 2021-12-06 DIAGNOSIS — I5031 Acute diastolic (congestive) heart failure: Secondary | ICD-10-CM | POA: Diagnosis not present

## 2021-12-06 DIAGNOSIS — N2581 Secondary hyperparathyroidism of renal origin: Secondary | ICD-10-CM | POA: Diagnosis present

## 2021-12-06 DIAGNOSIS — T380X5A Adverse effect of glucocorticoids and synthetic analogues, initial encounter: Secondary | ICD-10-CM | POA: Diagnosis present

## 2021-12-06 LAB — BASIC METABOLIC PANEL
Anion gap: 13 (ref 5–15)
BUN: 47 mg/dL — ABNORMAL HIGH (ref 8–23)
CO2: 27 mmol/L (ref 22–32)
Calcium: 10.1 mg/dL (ref 8.9–10.3)
Chloride: 99 mmol/L (ref 98–111)
Creatinine, Ser: 10.38 mg/dL — ABNORMAL HIGH (ref 0.44–1.00)
GFR, Estimated: 4 mL/min — ABNORMAL LOW (ref >60)
Glucose, Bld: 143 mg/dL — ABNORMAL HIGH (ref 70–99)
Potassium: 3.6 mmol/L (ref 3.5–5.1)
Sodium: 139 mmol/L (ref 135–145)

## 2021-12-06 LAB — MAGNESIUM: Magnesium: 2.5 mg/dL — ABNORMAL HIGH (ref 1.7–2.4)

## 2021-12-06 LAB — CBC
HCT: 35.8 % — ABNORMAL LOW (ref 36.0–46.0)
Hemoglobin: 11.3 g/dL — ABNORMAL LOW (ref 12.0–15.0)
MCH: 28.5 pg (ref 26.0–34.0)
MCHC: 31.6 g/dL (ref 30.0–36.0)
MCV: 90.4 fL (ref 80.0–100.0)
Platelets: 272 10*3/uL (ref 150–400)
RBC: 3.96 MIL/uL (ref 3.87–5.11)
RDW: 16 % — ABNORMAL HIGH (ref 11.5–15.5)
WBC: 15 10*3/uL — ABNORMAL HIGH (ref 4.0–10.5)
nRBC: 0 % (ref 0.0–0.2)

## 2021-12-06 LAB — ECHOCARDIOGRAM LIMITED
Calc EF: 51.3 %
Height: 63 in
S' Lateral: 3.1 cm
Single Plane A2C EF: 52.6 %
Single Plane A4C EF: 51.4 %
Weight: 2960 oz

## 2021-12-06 LAB — BRAIN NATRIURETIC PEPTIDE: B Natriuretic Peptide: 2593 pg/mL — ABNORMAL HIGH (ref 0.0–100.0)

## 2021-12-06 LAB — TROPONIN I (HIGH SENSITIVITY)
Troponin I (High Sensitivity): 68 ng/L — ABNORMAL HIGH (ref <18)
Troponin I (High Sensitivity): 59 ng/L — ABNORMAL HIGH (ref <18)

## 2021-12-06 LAB — GLUCOSE, CAPILLARY
Glucose-Capillary: 128 mg/dL — ABNORMAL HIGH (ref 70–99)
Glucose-Capillary: 189 mg/dL — ABNORMAL HIGH (ref 70–99)
Glucose-Capillary: 124 mg/dL — ABNORMAL HIGH (ref 70–99)

## 2021-12-06 LAB — RESP PANEL BY RT-PCR (FLU A&B, COVID) ARPGX2
Influenza A by PCR: NEGATIVE
Influenza B by PCR: NEGATIVE
SARS Coronavirus 2 by RT PCR: NEGATIVE

## 2021-12-06 LAB — HEMOGLOBIN A1C
Hgb A1c MFr Bld: 7.3 % — ABNORMAL HIGH (ref 4.8–5.6)
Mean Plasma Glucose: 162.81 mg/dL

## 2021-12-06 LAB — HEPATITIS B SURFACE ANTIGEN: Hepatitis B Surface Ag: NONREACTIVE

## 2021-12-06 LAB — PHOSPHORUS: Phosphorus: 5.2 mg/dL — ABNORMAL HIGH (ref 2.5–4.6)

## 2021-12-06 LAB — HEPATITIS B SURFACE ANTIBODY,QUALITATIVE: Hep B S Ab: REACTIVE — AB

## 2021-12-06 MED ORDER — SODIUM CHLORIDE 0.9 % IV SOLN: 100.0000 mL | INTRAVENOUS | Status: DC | PRN

## 2021-12-06 MED ORDER — NIFEDIPINE ER OSMOTIC RELEASE 30 MG PO TB24
60.0000 mg | ORAL_TABLET | Freq: Every day | ORAL | Status: DC
  Administered 2021-12-06 – 2021-12-09 (×4): 60 mg via ORAL
  Filled 2021-12-06 (×5): qty 2

## 2021-12-06 MED ORDER — CHLORHEXIDINE GLUCONATE CLOTH 2 % EX PADS
6.0000 | MEDICATED_PAD | Freq: Every day | CUTANEOUS | Status: DC
  Administered 2021-12-06 – 2021-12-10 (×4): 6 via TOPICAL

## 2021-12-06 MED ORDER — ALBUTEROL SULFATE (2.5 MG/3ML) 0.083% IN NEBU
2.5000 mg | INHALATION_SOLUTION | RESPIRATORY_TRACT | Status: DC | PRN
  Administered 2021-12-06: 2.5 mg via RESPIRATORY_TRACT
  Filled 2021-12-06: qty 3

## 2021-12-06 MED ORDER — ROPINIROLE HCL 1 MG PO TABS
1.0000 mg | ORAL_TABLET | Freq: Every day | ORAL | Status: DC
  Administered 2021-12-06 – 2021-12-09 (×4): 1 mg via ORAL
  Filled 2021-12-06 (×4): qty 1

## 2021-12-06 MED ORDER — INSULIN GLARGINE-YFGN 100 UNIT/ML ~~LOC~~ SOLN
10.0000 [IU] | Freq: Every day | SUBCUTANEOUS | Status: DC
  Administered 2021-12-06 – 2021-12-09 (×4): 10 [IU] via SUBCUTANEOUS
  Filled 2021-12-06 (×6): qty 0.1

## 2021-12-06 MED ORDER — LIDOCAINE HCL (PF) 1 % IJ SOLN: 5.0000 mL | INTRAMUSCULAR | Status: DC | PRN

## 2021-12-06 MED ORDER — CARBOXYMETHYLCELLUL-GLYCERIN 0.5-0.9 % OP SOLN: 1.0000 [drp] | Freq: Four times a day (QID) | OPHTHALMIC | Status: DC | PRN

## 2021-12-06 MED ORDER — MOMETASONE FURO-FORMOTEROL FUM 200-5 MCG/ACT IN AERO
2.0000 | INHALATION_SPRAY | Freq: Two times a day (BID) | RESPIRATORY_TRACT | Status: DC
  Administered 2021-12-06 – 2021-12-10 (×9): 2 via RESPIRATORY_TRACT
  Filled 2021-12-06: qty 8.8

## 2021-12-06 MED ORDER — INSULIN ASPART 100 UNIT/ML IJ SOLN
0.0000 [IU] | Freq: Three times a day (TID) | INTRAMUSCULAR | Status: DC
  Administered 2021-12-06 – 2021-12-08 (×3): 1 [IU] via SUBCUTANEOUS
  Administered 2021-12-09: 5 [IU] via SUBCUTANEOUS
  Administered 2021-12-09: 4 [IU] via SUBCUTANEOUS
  Administered 2021-12-09: 6 [IU] via SUBCUTANEOUS
  Administered 2021-12-10: 2 [IU] via SUBCUTANEOUS

## 2021-12-06 MED ORDER — CALCIUM ACETATE (PHOS BINDER) 667 MG PO CAPS
667.0000 mg | ORAL_CAPSULE | ORAL | Status: DC
  Administered 2021-12-07 – 2021-12-09 (×6): 667 mg via ORAL
  Filled 2021-12-06 (×5): qty 1

## 2021-12-06 MED ORDER — IPRATROPIUM-ALBUTEROL 0.5-2.5 (3) MG/3ML IN SOLN
3.0000 mL | Freq: Once | RESPIRATORY_TRACT | Status: AC
  Administered 2021-12-06: 3 mL via RESPIRATORY_TRACT
  Filled 2021-12-06: qty 3

## 2021-12-06 MED ORDER — LIDOCAINE-PRILOCAINE 2.5-2.5 % EX CREA: 1.0000 "application " | TOPICAL_CREAM | CUTANEOUS | Status: DC | PRN

## 2021-12-06 MED ORDER — ASPIRIN EC 81 MG PO TBEC
81.0000 mg | DELAYED_RELEASE_TABLET | Freq: Every day | ORAL | Status: DC
  Administered 2021-12-06 – 2021-12-10 (×5): 81 mg via ORAL
  Filled 2021-12-06 (×6): qty 1

## 2021-12-06 MED ORDER — QUETIAPINE FUMARATE 25 MG PO TABS
50.0000 mg | ORAL_TABLET | Freq: Every day | ORAL | Status: DC
  Administered 2021-12-06 – 2021-12-09 (×4): 50 mg via ORAL
  Filled 2021-12-06 (×4): qty 2

## 2021-12-06 MED ORDER — ALBUTEROL SULFATE HFA 108 (90 BASE) MCG/ACT IN AERS: 2.0000 | INHALATION_SPRAY | Freq: Four times a day (QID) | RESPIRATORY_TRACT | Status: DC | PRN

## 2021-12-06 MED ORDER — CALCIUM ACETATE (PHOS BINDER) 667 MG PO CAPS
1334.0000 mg | ORAL_CAPSULE | Freq: Three times a day (TID) | ORAL | Status: DC
  Administered 2021-12-06 – 2021-12-10 (×10): 1334 mg via ORAL
  Filled 2021-12-06 (×13): qty 2

## 2021-12-06 MED ORDER — ALBUTEROL SULFATE (2.5 MG/3ML) 0.083% IN NEBU
2.5000 mg | INHALATION_SOLUTION | Freq: Four times a day (QID) | RESPIRATORY_TRACT | Status: DC | PRN
  Administered 2021-12-06 – 2021-12-08 (×4): 2.5 mg via RESPIRATORY_TRACT
  Filled 2021-12-06 (×5): qty 3

## 2021-12-06 MED ORDER — POLYVINYL ALCOHOL 1.4 % OP SOLN: 1.0000 [drp] | OPHTHALMIC | Status: DC | PRN

## 2021-12-06 MED ORDER — HEPARIN SODIUM (PORCINE) 1000 UNIT/ML DIALYSIS: 20.0000 [IU]/kg | INTRAMUSCULAR | Status: DC | PRN

## 2021-12-06 MED ORDER — PENTAFLUOROPROP-TETRAFLUOROETH EX AERO: 1.0000 "application " | INHALATION_SPRAY | CUTANEOUS | Status: DC | PRN

## 2021-12-06 MED ORDER — MONTELUKAST SODIUM 10 MG PO TABS
10.0000 mg | ORAL_TABLET | Freq: Every day | ORAL | Status: DC
  Administered 2021-12-06 – 2021-12-09 (×4): 10 mg via ORAL
  Filled 2021-12-06 (×4): qty 1

## 2021-12-06 MED ORDER — INSULIN ASPART 100 UNIT/ML IJ SOLN
0.0000 [IU] | Freq: Every day | INTRAMUSCULAR | Status: DC
  Administered 2021-12-07: 2 [IU] via SUBCUTANEOUS
  Administered 2021-12-08: 3 [IU] via SUBCUTANEOUS
  Administered 2021-12-09: 4 [IU] via SUBCUTANEOUS

## 2021-12-06 MED ORDER — HEPARIN SODIUM (PORCINE) 5000 UNIT/ML IJ SOLN
5000.0000 [IU] | Freq: Three times a day (TID) | INTRAMUSCULAR | Status: DC
  Administered 2021-12-06 – 2021-12-10 (×11): 5000 [IU] via SUBCUTANEOUS
  Filled 2021-12-06 (×11): qty 1

## 2021-12-06 MED ORDER — ATENOLOL 25 MG PO TABS
25.0000 mg | ORAL_TABLET | Freq: Every day | ORAL | Status: DC
  Administered 2021-12-06 – 2021-12-09 (×4): 25 mg via ORAL
  Filled 2021-12-06 (×5): qty 1

## 2021-12-06 MED ORDER — DULOXETINE HCL 20 MG PO CPEP
20.0000 mg | ORAL_CAPSULE | Freq: Every day | ORAL | Status: DC
  Administered 2021-12-06 – 2021-12-10 (×5): 20 mg via ORAL
  Filled 2021-12-06 (×5): qty 1

## 2021-12-06 MED ORDER — NITROGLYCERIN 0.4 MG SL SUBL: 0.4000 mg | SUBLINGUAL_TABLET | SUBLINGUAL | Status: DC | PRN

## 2021-12-06 MED ORDER — ROSUVASTATIN CALCIUM 10 MG PO TABS
5.0000 mg | ORAL_TABLET | Freq: Every day | ORAL | Status: DC
  Administered 2021-12-06 – 2021-12-10 (×5): 5 mg via ORAL
  Filled 2021-12-06 (×5): qty 1

## 2021-12-06 MED ORDER — ENOXAPARIN SODIUM 30 MG/0.3ML IJ SOSY
30.0000 mg | PREFILLED_SYRINGE | INTRAMUSCULAR | Status: DC
  Administered 2021-12-06: 30 mg via SUBCUTANEOUS
  Filled 2021-12-06: qty 0.3

## 2021-12-06 MED ORDER — PANTOPRAZOLE SODIUM 40 MG PO TBEC
40.0000 mg | DELAYED_RELEASE_TABLET | Freq: Every day | ORAL | Status: DC
  Administered 2021-12-06 – 2021-12-10 (×5): 40 mg via ORAL
  Filled 2021-12-06 (×5): qty 1

## 2021-12-06 MED ORDER — GABAPENTIN 400 MG PO CAPS
400.0000 mg | ORAL_CAPSULE | Freq: Every day | ORAL | Status: DC
  Administered 2021-12-06 – 2021-12-09 (×4): 400 mg via ORAL
  Filled 2021-12-06 (×4): qty 1

## 2021-12-06 NOTE — Procedures (Signed)
° °  HEMODIALYSIS TREATMENT NOTE:   Uneventful 4 hour low-heparin treatment completed using right upper arm AVF (15g/antegrade). Goal met: 3.4 liters removed without interruption in UF.  All blood was returned.  Although she states her dyspnea is "much better" she is still c/o "some tightness across my chest" and states "I hope they'll give me some prednisone."   Prolonged bleeding, which is normal for her, post-tx; hemostasis was achieved in 30 minutes.  Rockwell Alexandria, RN

## 2021-12-06 NOTE — Progress Notes (Signed)
[name]  VQM:[id number] DOB: 1951/10/04 DOA: 12/06/2021 PCP: [name], MD    Brief Narrative:  [phone] with a history of ESRD on HD TTS, chronic diastolic CHF, DM2, HTN, HLD, CAD status post stent placement, COPD, and GERD who presented to the ED with shortness of breath for approximately 24 hours.  This began with simple orthopnea but progressed throughout the day and was associated with worsening leg swelling as well as swelling of the hands.  [name] last dialysis treatment was 12/03/2021.  In the ED her oxygen saturation was 90% on 3 L/min nasal cannula.  She was COVID and influenza negative.  CXR suggested pulmonary edema.  Consultants:  Nephrology  Code Status: FULL CODE  Antimicrobials:  None  DVT prophylaxis: Subcutaneous heparin  Interim Hx: Afebrile.  Blood pressure elevated at 712-458 systolic.  Saturations 97% on 3 L nasal cannula. Pt admits to dietary indiscretion over the holidays, particularly in regard to consumption of salty foods.   Assessment & Plan:  Acute volume overload - Acute diastolic CHF exacerbation Due to dietary salt indiscretion - volume management per dialysis   [facility] Weights   12/06/21 0103  Weight: 83.9 kg    Mildly elevated troponin -demand ischemia Troponin trend has flattened already - asymptomatic - no indication for further evaluation  ESRD on HD TTS Nephrology following and attending to HD  Anemia of chronic kidney disease Stable at apparent baseline of approximately 11  Uncontrolled DM2 with hyperglycemia Monitor CBG trend   HTN Continue usual atenolol and nifedipine  Coronary artery disease status post remote stent placement Continue aspirin, nitroglycerin, and Crestor  HLD Continue Crestor  COPD Continue usual nebulizer therapies  GERD Complex continue Protonix   Family Communication: no family present at time of exam  Disposition: for HD today - monitor sx post-HD - anticipate d/c home 12/07/21 if no  further SOB post-HD  Objective: Blood pressure (!) 171/79, pulse 85, temperature 98.5 F (36.9 C), temperature source Oral, resp. rate 20, height 5\' 3"  (1.6 m), weight 83.9 kg, SpO2 96 %. No intake or output data in the 24 hours ending 12/06/21 1004 Filed Weights   12/06/21 0103  Weight: 83.9 kg    Examination: F/U exam completed   CBC: Recent Labs  Lab 12/06/21 0137  WBC 15.0*  HGB 11.3*  HCT 35.8*  MCV 90.4  PLT 099   Basic Metabolic Panel: Recent Labs  Lab 12/06/21 0137 12/06/21 0438  NA 139  --   K 3.6  --   CL 99  --   CO2 27  --   GLUCOSE 143*  --   BUN 47*  --   CREATININE 10.38*  --   CALCIUM 10.1  --   MG  --  2.5*  PHOS  --  5.2*   GFR: Estimated Creatinine Clearance: 5.2 mL/min (A) (by C-G formula based on SCr of 10.38 mg/dL (H)).   HbA1C: Hemoglobin A1C  Date/Time Value Ref Range Status  06/08/2016 12:00 AM 6.2  Final   HB A1C (BAYER DCA - WAIVED)  Date/Time Value Ref Range Status  01/31/2021 10:01 AM 7.6 (H) <7.0 % Final    Comment:                                          Diabetic Adult            <7.0  Healthy Adult        4.3 - 5.7                                                           (DCCT/NGSP) American Diabetes Association's Summary of Glycemic Recommendations for Adults with Diabetes: Hemoglobin A1c <7.0%. More stringent glycemic goals (A1c <6.0%) may further reduce complications at the cost of increased risk of hypoglycemia.   11/01/2020 09:28 AM 6.2 <7.0 % Final    Comment:                                          Diabetic Adult            <7.0                                       Healthy Adult        4.3 - 5.7                                                           (DCCT/NGSP) American Diabetes Association's Summary of Glycemic Recommendations for Adults with Diabetes: Hemoglobin A1c <7.0%. More stringent glycemic goals (A1c <6.0%) may further reduce complications at the cost of  increased risk of hypoglycemia.    Hgb A1c MFr Bld  Date/Time Value Ref Range Status  02/20/2021 04:13 AM 7.9 (H) 4.8 - 5.6 % Final    Comment:    (NOTE) Pre diabetes:          5.7%-6.4%  Diabetes:              >6.4%  Glycemic control for   <7.0% adults with diabetes     CBG: Recent Labs  Lab 12/06/21 0812  GLUCAP 128*     Scheduled Meds:  aspirin EC  81 mg Oral Daily   atenolol  25 mg Oral Daily   enoxaparin (LOVENOX) injection  30 mg Subcutaneous Q24H   gabapentin  400 mg Oral QHS   insulin aspart  0-5 Units Subcutaneous QHS   insulin aspart  0-6 Units Subcutaneous TID WC   insulin glargine-yfgn  10 Units Subcutaneous QHS   mometasone-formoterol  2 puff Inhalation BID   NIFEdipine  60 mg Oral Daily   pantoprazole  40 mg Oral Daily   rOPINIRole  1 mg Oral QHS   rosuvastatin  5 mg Oral Daily     LOS: 0 days   Cherene Altes, MD [facility] Office  [phone] Pager - Text Page per Shea Evans  If 7PM-7AM, please contact night-coverage per Amion 12/06/2021, 10:04 AM

## 2021-12-06 NOTE — Consult Note (Signed)
[facility] Renal Consultation Note    Indication for Consultation:  Management of ESRD/hemodialysis; anemia, hypertension/volume and secondary hyperparathyroidism  HPI: [name] is a 71 y.o. female with a PMH significant for chronic diastolic CHF, CAD, COPD, DM, HTN, obesity, HLD, GERD, and ESRD on HD TTS at [facility] who presented to [facility] ED with a 1 day history of worsening SOB, lower extremity edema, orthopnea, and chest tightness.  In the ED she was in moderate respiratory distress with tachypnea, hypoxia, SBP's in the 170's-180's. Labs with WBC 15, BUN/Cr 47/10.38, K 3.6, and BNP 2593.  Negative respiratory panel for influenza or covid-19.  CXR with cardiomegaly and mild CHF.  She was admitted for further evaluation and management of her acute hypoxic respiratory failure and acute on chronic diastolic CHF.  We were consulted to provide dialysis during her hospitalization.  She reports completing a full treatment on Saturday and was able to get below her edw without issue.  She did admit to eating high sodium foods over the weekend as well as eating ice.  Past Medical History:  Diagnosis Date   Acute on chronic diastolic CHF (congestive heart failure) ([place]) 03/05/2015   Grade 2. EF 60-65%   Anemia    hx of   Anxiety    Arthritis    CAD (coronary artery disease)    s/p Stenting in 2005;  [place] 7/09: Vigorous LV function, 2-3+ MR, RI 40%, RI stent patent, proximal-mid RCA 40-50%;  [place] 7/09:  EF 55-65%, trivial MR;  [place] 11/18/12: EF 66%, normal LV wall motion, mild to moderate anterior ischemia - reviewed by [facility] and felt to rep breast atten (low risk);  Echo 6/14: mild LVH, EF 55-65%, Gr 2 DD, PASP 44, trivial eff    Chronic kidney disease    CREATININE IS UP--GOING TO KIDNEY MD    Chronic neck pain    COPD (chronic obstructive pulmonary disease) (HCC)    Degenerative joint disease    left shoulder   Diabetic neuropathy (HCC)    Diverticulosis    Esophageal  stricture    GERD (gastroesophageal reflux disease)    Hyperlipidemia    Hypertension    IDDM (insulin dependent diabetes mellitus)    Pericardial effusion 03/06/2015   Very small per echo ; pleural nodules seen on CT chest.   Peripheral vascular disease ([place])    Stroke ([place])    "mini- years ago"   Vitamin D deficiency    Past Surgical History:  Procedure Laterality Date   A/V FISTULAGRAM N/A 08/10/2017   Procedure: A/V Fistulagram - Right Arm;  Surgeon: Elam Dutch, MD;  Location: [place] CV LAB;  Service: Cardiovascular;  Laterality: N/A;   A/V FISTULAGRAM N/A 03/03/2020   Procedure: A/V FISTULAGRAM - Right Arm;  Surgeon: Marty Heck, MD;  Location: [place] CV LAB;  Service: Cardiovascular;  Laterality: N/A;   ABDOMINAL HYSTERECTOMY     AV FISTULA PLACEMENT Right 07/20/2015   Procedure: RADIAL CEPHALIC ARTERIOVENOUS FISTULA CREATION RIGHT ARM;  Surgeon: Angelia Mould, MD;  Location: [place];  Service: Vascular;  Laterality: Right;   AV FISTULA PLACEMENT Right 11/26/2015   Procedure:  Creation of RIGHT BRACHIO-CEPHALIC arteriovenous fistula;  Surgeon: Angelia Mould, MD;  Location: [facility] OR;  Service: Vascular;  Laterality: Right;   BACK SURGERY     2007 & 2013   [place]  2003, 2005, 2009   1 stent  cardiac stents     CATARACT EXTRACTION W/PHACO Right 05/04/2014   Procedure: CATARACT EXTRACTION PHACO AND INTRAOCULAR LENS PLACEMENT (IOC);  Surgeon: Williams Che, MD;  Location: [facility];  Service: Ophthalmology;  Laterality: Right;  CDE 1.47   CATARACT EXTRACTION W/PHACO Left 07/20/2014   Procedure: CATARACT EXTRACTION WITH PHACO AND INTRAOCULAR LENS PLACEMENT LEFT EYE CDE=1.79;  Surgeon: Williams Che, MD;  Location: [facility];  Service: Ophthalmology;  Laterality: Left;   CERVICAL SPINE SURGERY  2012   8 screws   CYSTOSCOPY WITH INJECTION N/A 06/03/2013   Procedure: MACROPLASTIQUE  INJECTION ;  Surgeon: Malka So, MD;  Location: [facility];  Service: Urology;  Laterality: N/A;   FISTULOGRAM Right 10/20/2016   Procedure: FISTULOGRAM WITH POSSIBLE INTERVENTION;  Surgeon: [name], MD;  Location: [facility];  Service: Vascular;  Laterality: Right;   INSERTION OF DIALYSIS CATHETER N/A 11/26/2015   Procedure: INSERTION OF DIALYSIS CATHETER;  Surgeon: Angelia Mould, MD;  Location: [place];  Service: Vascular;  Laterality: N/A;   LAMINECTOMY  12/29/2011   LUMBAR LAMINECTOMY/DECOMPRESSION MICRODISCECTOMY  12/29/2011   Procedure: LUMBAR LAMINECTOMY/DECOMPRESSION MICRODISCECTOMY;  Surgeon: Floyce Stakes, MD;  Location: [place] NEURO ORS;  Service: Neurosurgery;  Laterality: N/A;  Lumbar Two through Lumbar Five Laminectomies/Cellsaver   PARTIAL HYSTERECTOMY     PERIPHERAL VASCULAR BALLOON ANGIOPLASTY  03/03/2020   Procedure: PERIPHERAL VASCULAR BALLOON ANGIOPLASTY;  Surgeon: Marty Heck, MD;  Location: [place] CV LAB;  Service: Cardiovascular;;  Rt arm fistula   PERIPHERAL VASCULAR BALLOON ANGIOPLASTY Right 04/15/2021   Procedure: PERIPHERAL VASCULAR BALLOON ANGIOPLASTY;  Surgeon: Angelia Mould, MD;  Location: [place] CV LAB;  Service: Cardiovascular;  Laterality: Right;  arm fistula   PERIPHERAL VASCULAR CATHETERIZATION N/A 11/22/2015   Procedure: Fistulagram;  Surgeon: Angelia Mould, MD;  Location: [place] CV LAB;  Service: Cardiovascular;  Laterality: N/A;   PERIPHERAL VASCULAR INTERVENTION  08/10/2017   Procedure: PERIPHERAL VASCULAR INTERVENTION;  Surgeon: Elam Dutch, MD;  Location: [place] CV LAB;  Service: Cardiovascular;;   SHOULDER SURGERY Right    Rotator cuff   Family History:   Family History  Problem Relation Age of Onset   Heart disease Other    Hypertension Father    Cancer Father    Hypertension Mother    Hypertension Daughter    Pancreatitis Brother    Hypertension Daughter    Diabetes Daughter    Social History:  reports that she  quit smoking about 20 years ago. Her smoking use included cigarettes. She has a 30.00 pack-year smoking history. She has never used smokeless tobacco. She reports that she does not drink alcohol and does not use drugs. Allergies  Allergen Reactions   Azithromycin     Prolonged QT   Ace Inhibitors Cough   Clopidogrel Bisulfate Other (See Comments)    Sick, Headache, "Felt terrible"   Codeine Other (See Comments)    hallucinations   Lisinopril Cough   Penicillins Other (See Comments)    Unknown reaction Did it involve swelling of the face/tongue/throat, SOB, or low BP? Unknown Did it involve sudden or severe rash/hives, skin peeling, or any reaction on the inside of your mouth or nose? Unknown Did you need to seek medical attention at a hospital or doctor's office? Unknown When did it last happen?      childhood allergy If all above answers are "NO", may proceed with cephalosporin use.     Prior to Admission medications  Medication Sig Start Date End Date Taking? Authorizing Provider  albuterol (PROVENTIL) (2.5 MG/3ML) 0.083% nebulizer solution Take 3 mLs (2.5 mg total) by nebulization every 6 (six) hours as needed for wheezing or shortness of breath. 12/02/21  Yes [name], MD  albuterol (VENTOLIN HFA) 108 (90 Base) MCG/ACT inhaler Inhale 2 puffs into the lungs every 6 (six) hours as needed for wheezing or shortness of breath. 12/02/21  Yes [name], MD  aspirin 81 MG EC tablet Take 81 mg by mouth daily.   Yes [provider]  atenolol (TENORMIN) 50 MG tablet Take 1 tablet (50 mg total) by mouth 2 (two) times daily. 09/15/21  Yes [name], [name], MD  budesonide-formoterol (SYMBICORT) 160-4.5 MCG/ACT inhaler Inhale 2 puffs into the lungs 2 (two) times daily. 11/15/21  Yes [name], [name], MD  busPIRone (BUSPAR) 7.5 MG tablet Take 1 tablet (7.5 mg total) by mouth 3 (three) times daily as needed. 09/15/21  Yes [name], MD  calcium acetate (PHOSLO) 667 MG capsule  Take 667-1,334 mg by mouth See admin instructions. Take 1334 mg with meals 3 times daily and 667 mg with snacks   Yes [provider]  Carboxymethylcellul-Glycerin (LUBRICATING EYE DROPS OP) Place 1 drop into both eyes daily as needed (dry eyes).   Yes [provider]  cetirizine (ZYRTEC) 10 MG tablet Take 1 tablet (10 mg total) by mouth daily as needed for allergies. 09/15/21  Yes [name], [name], MD  Continuous Blood Gluc Receiver (FREESTYLE LIBRE 2 READER) DEVI USE TO TEST BLOOD SUGAR CONTINUOUSLY 11/15/21  Yes [name], [name], MD  Continuous Blood Gluc Sensor (FREESTYLE LIBRE 2 SENSOR) MISC Use multiple times daily to track glucose to prevent highs and lows as a complication of dialysis Dx:Z99.2, E11.59 09/15/21  Yes [name], [name], MD  DULoxetine (CYMBALTA) 20 MG capsule Take 20 mg by mouth daily. 06/26/21  Yes [provider]  gabapentin (NEURONTIN) 400 MG capsule Take 1 capsule (400 mg total) by mouth See admin instructions. Take it after HD 02/21/21  Yes Barton Dubois, MD  glucose blood (ONETOUCH ULTRA) test strip Use to test blood sugar 3 times daily as directed. DX: E11.9 09/13/20  Yes [name], [name], MD  Insulin Glargine (BASAGLAR KWIKPEN) 100 UNIT/ML Inject 25 Units into the skin at bedtime. Add five units each time the fasting is over 125 three days in a row. 11/15/21  Yes [name], [name], MD  montelukast (SINGULAIR) 10 MG tablet Take 10 mg by mouth at bedtime. 06/26/21  Yes [provider]  NIFEdipine (PROCARDIA XL/NIFEDICAL XL) 60 MG 24 hr tablet TAKE 1 TABLET BY MOUTH ONCE DAILY (ON  NON-DIALYSIS  DAYS) 11/09/21  Yes Minus Breeding, MD  nitroGLYCERIN (NITROSTAT) 0.4 MG SL tablet Place 1 tablet (0.4 mg total) under the tongue every 5 (five) minutes as needed for chest pain. 11/09/21  Yes Minus Breeding, MD  omeprazole (PRILOSEC) 20 MG capsule Take 1 capsule (20 mg total) by mouth daily. 08/26/21  Yes Ivy Lynn, NP  ondansetron (ZOFRAN) 4 MG tablet TAKE  1 TABLET BY MOUTH EVERY 8 HOURS AS NEEDED FOR NAUSEA AND VOMITING 11/10/21  Yes [name], MD  QUEtiapine (SEROQUEL) 50 MG tablet Take 1 tablet (50 mg total) by mouth at bedtime. For sleep and for anxiety 06/08/21  Yes [name], [name], MD  rOPINIRole (REQUIP) 1 MG tablet Take 1 tablet (1 mg total) by mouth at bedtime. For leg cramps 09/15/21  Yes [name], [name], MD  rosuvastatin (CRESTOR) 5 MG tablet Take 1 tablet (  5 mg total) by mouth daily. For cholesterol 02/04/21  Yes [name], [name], MD  Semaglutide,0.25 or 0.5MG /DOS, (OZEMPIC, 0.25 OR 0.5 MG/DOSE,) 2 MG/1.5ML SOPN Inject 0.25 mg into the skin once a week. Friday   Yes [provider]  acetaminophen (TYLENOL) 500 MG tablet Take 1,000 mg by mouth every 6 (six) hours as needed for moderate pain or headache.    [provider]   Current Facility-Administered Medications  Medication Dose Route Frequency Provider Last Rate Last Admin   albuterol (PROVENTIL) (2.5 MG/3ML) 0.083% nebulizer solution 2.5 mg  2.5 mg Nebulization Q6H PRN Adefeso, Oladapo, DO       aspirin EC tablet 81 mg  81 mg Oral Daily Adefeso, Oladapo, DO   81 mg at 12/06/21 0924   atenolol (TENORMIN) tablet 25 mg  25 mg Oral Daily Adefeso, Oladapo, DO   25 mg at 12/06/21 9476   Chlorhexidine Gluconate Cloth 2 % PADS 6 each  6 each Topical Q0600 Donato Heinz, MD       enoxaparin (LOVENOX) injection 30 mg  30 mg Subcutaneous Q24H Adefeso, Oladapo, DO   30 mg at 12/06/21 0926   gabapentin (NEURONTIN) capsule 400 mg  400 mg Oral QHS Adefeso, Oladapo, DO       insulin aspart (novoLOG) injection 0-5 Units  0-5 Units Subcutaneous QHS Adefeso, Oladapo, DO       insulin aspart (novoLOG) injection 0-6 Units  0-6 Units Subcutaneous TID WC Adefeso, Oladapo, DO       insulin glargine-yfgn (SEMGLEE) injection 10 Units  10 Units Subcutaneous QHS Adefeso, Oladapo, DO       mometasone-formoterol (DULERA) 200-5 MCG/ACT inhaler 2 puff  2 puff Inhalation BID Adefeso, Oladapo, DO        NIFEdipine (PROCARDIA-XL/NIFEDICAL-XL) 24 hr tablet 60 mg  60 mg Oral Daily Adefeso, Oladapo, DO   60 mg at 12/06/21 0924   nitroGLYCERIN (NITROSTAT) SL tablet 0.4 mg  0.4 mg Sublingual Q5 min PRN Adefeso, Oladapo, DO       pantoprazole (PROTONIX) EC tablet 40 mg  40 mg Oral Daily Adefeso, Oladapo, DO   40 mg at 12/06/21 0924   rOPINIRole (REQUIP) tablet 1 mg  1 mg Oral QHS Adefeso, Oladapo, DO       rosuvastatin (CRESTOR) tablet 5 mg  5 mg Oral Daily Adefeso, Oladapo, DO   5 mg at 12/06/21 0925   Labs: Basic Metabolic Panel: Recent Labs  Lab 12/06/21 0137 12/06/21 0438  NA 139  --   K 3.6  --   CL 99  --   CO2 27  --   GLUCOSE 143*  --   BUN 47*  --   CREATININE 10.38*  --   CALCIUM 10.1  --   PHOS  --  5.2*   Liver Function Tests: No results for input(s): AST, ALT, ALKPHOS, BILITOT, PROT, ALBUMIN in the last 168 hours. No results for input(s): LIPASE, AMYLASE in the last 168 hours. No results for input(s): AMMONIA in the last 168 hours. CBC: Recent Labs  Lab 12/06/21 0137  WBC 15.0*  HGB 11.3*  HCT 35.8*  MCV 90.4  PLT 272   Cardiac Enzymes: No results for input(s): CKTOTAL, CKMB, CKMBINDEX, TROPONINI in the last 168 hours. CBG: Recent Labs  Lab 12/06/21 0812  GLUCAP 128*   Iron Studies: No results for input(s): IRON, TIBC, TRANSFERRIN, FERRITIN in the last 72 hours. Studies/Results: DG Chest Port 1 View  Result Date: 12/06/2021 CLINICAL DATA:  Dyspnea EXAM: PORTABLE  CHEST 1 VIEW COMPARISON:  02/19/2021 FINDINGS: The lungs are symmetrically well expanded. Stable cardiomegaly. Interval development of mild perihilar and lower lung zone interstitial pulmonary infiltrate possibly representing cardiogenic pulmonary edema. No pneumothorax. Possible small bilateral pleural effusions. No acute bone abnormality. IMPRESSION: Stable cardiomegaly. Suspected mild superimposed cardiogenic failure. Electronically Signed   By: Fidela Salisbury M.D.   On: 12/06/2021 03:05     ROS: Pertinent items are noted in HPI. Physical Exam: Vitals:   12/06/21 0330 12/06/21 0343 12/06/21 0552 12/06/21 0801  BP: (!) 167/63  (!) 171/79   Pulse: 85  85   Resp: (!) 28  20   Temp:   98.5 F (36.9 C)   TempSrc:   Oral   SpO2: 94% 92% 97% 96%  Weight:      Height:          Weight change:  No intake or output data in the 24 hours ending 12/06/21 1043 BP (!) 171/79 (BP Location: Left Arm)    Pulse 85    Temp 98.5 F (36.9 C) (Oral)    Resp 20    Ht 5\' 3"  (1.6 m)    Wt 83.9 kg    SpO2 96%    BMI 32.77 kg/m  General appearance: alert, cooperative, mild distress, and sitting upright in bed. Head: Normocephalic, without obvious abnormality, atraumatic Resp: diminished breath sounds bibasilar Cardio: regular rate and rhythm, S1, S2 normal, no murmur, click, rub or gallop GI: soft, non-tender; bowel sounds normal; no masses,  no organomegaly Extremities: edema trace to 1 + bilateral pretibial edema and RUE AVF +T/B Dialysis Access:  Dialysis Orders: Center: [facility]  on TTS . EDW 86.5kg HD Bath 1K/2.5Ca  Time 4 hours Heparin 1000 units IV bolus then 1000 unts/hr. Access RUE AVF BFR 350 DFR 500    Calcitriol 2.25 mcg po/HD  Micera 60 mcg IV every 2 weeks   Assessment/Plan:  Acute hypoxic respiratory failure - presumably due to volume overload/acute on chronic diastolic CHF.  Likely due to dietary indiscretion.  Will plan for HD today and UF as tolerated.  ESRD -  will continue with HD on TTS schedule for now.  Hypertension/volume  - as above will plan to UF as tolerated.  She may need serial dialysis if no significant improvement with HD.  Anemia  - stable  Metabolic bone disease -   continue with outpatient meds  Nutrition - renal diet, carb modified, low sodium.  Donetta Potts, MD [place] Pager [phone] 12/06/2021, 10:43 AM

## 2021-12-06 NOTE — ED Provider Notes (Signed)
[facility] Emergency Department Provider Note MRN:  [id number]  Arrival date & time: 12/06/21     Chief Complaint   Shortness of Breath   History of Present Illness   [name] is a 71 y.o. year-old female with a history of ESRD, CHF, CAD presenting to the ED with chief complaint of shortness of breath.  Patient noticed some shortness of breath this evening that was worse when she laid flat and try to sleep.  Had dialysis on Saturday.  Endorsing some chest tightness as well.  Feels similar to prior episodes where she was fluid overloaded.  Also may feel like her COPD.  No abdominal pain, does not use oxygen at home.  Review of Systems  A thorough review of systems was obtained and all systems are negative except as noted in the HPI and PMH.   Patient's Health History    Past Medical History:  Diagnosis Date   Acute on chronic diastolic CHF (congestive heart failure) ([place]) 03/05/2015   Grade 2. EF 60-65%   Anemia    hx of   Anxiety    Arthritis    CAD (coronary artery disease)    s/p Stenting in 2005;  [place] 7/09: Vigorous LV function, 2-3+ MR, RI 40%, RI stent patent, proximal-mid RCA 40-50%;  [place] 7/09:  EF 55-65%, trivial MR;  Myoview 11/18/12: EF 66%, normal LV wall motion, mild to moderate anterior ischemia - reviewed by [facility] and felt to rep breast atten (low risk);  Echo 6/14: mild LVH, EF 55-65%, Gr 2 DD, PASP 44, trivial eff    Chronic kidney disease    CREATININE IS UP--GOING TO KIDNEY MD    Chronic neck pain    COPD (chronic obstructive pulmonary disease) (HCC)    Degenerative joint disease    left shoulder   Diabetic neuropathy (HCC)    Diverticulosis    Esophageal stricture    GERD (gastroesophageal reflux disease)    Hyperlipidemia    Hypertension    IDDM (insulin dependent diabetes mellitus)    Pericardial effusion 03/06/2015   Very small per echo ; pleural nodules seen on CT chest.   Peripheral vascular disease ([place])    Stroke ([place])     "mini- years ago"   Vitamin D deficiency     Past Surgical History:  Procedure Laterality Date   A/V FISTULAGRAM N/A 08/10/2017   Procedure: A/V Fistulagram - Right Arm;  Surgeon: Elam Dutch, MD;  Location: [place] [facility];  Service: Cardiovascular;  Laterality: N/A;   A/V FISTULAGRAM N/A 03/03/2020   Procedure: A/V FISTULAGRAM - Right Arm;  Surgeon: Marty Heck, MD;  Location: [place] [facility];  Service: Cardiovascular;  Laterality: N/A;   ABDOMINAL HYSTERECTOMY     AV FISTULA PLACEMENT Right 07/20/2015   Procedure: RADIAL CEPHALIC ARTERIOVENOUS FISTULA CREATION RIGHT ARM;  Surgeon: Angelia Mould, MD;  Location: [place];  Service: Vascular;  Laterality: Right;   AV FISTULA PLACEMENT Right 11/26/2015   Procedure:  Creation of RIGHT BRACHIO-CEPHALIC arteriovenous fistula;  Surgeon: Angelia Mould, MD;  Location: [facility] OR;  Service: Vascular;  Laterality: Right;   BACK SURGERY     2007 & 2013   [place]   CARDIAC CATHETERIZATION  2003, 2005, 2009   1 stent   cardiac stents     CATARACT EXTRACTION W/PHACO Right 05/04/2014   Procedure: CATARACT EXTRACTION PHACO AND INTRAOCULAR LENS PLACEMENT ([place]);  Surgeon: Debe Coder  Iona Hansen, MD;  [facility]: [facility];  Service: Ophthalmology;  Laterality: Right;  CDE 1.47   CATARACT EXTRACTION W/PHACO Left 07/20/2014   Procedure: CATARACT EXTRACTION WITH PHACO AND INTRAOCULAR LENS PLACEMENT LEFT EYE CDE=1.79;  Surgeon: Williams Che, MD;  Location: [facility];  Service: Ophthalmology;  Laterality: Left;   CERVICAL SPINE SURGERY  2012   8 screws   CYSTOSCOPY WITH INJECTION N/A 06/03/2013   Procedure: MACROPLASTIQUE  INJECTION ;  Surgeon: Malka So, MD;  [facility]: [facility];  Service: Urology;  Laterality: N/A;   FISTULOGRAM Right 10/20/2016   Procedure: FISTULOGRAM WITH POSSIBLE INTERVENTION;  Surgeon: [name], MD;  [facility]: [facility];  Service: Vascular;  Laterality: Right;   INSERTION OF DIALYSIS CATHETER N/A  11/26/2015   Procedure: INSERTION OF DIALYSIS CATHETER;  Surgeon: Angelia Mould, MD;  Location: [place];  Service: Vascular;  Laterality: N/A;   LAMINECTOMY  12/29/2011   LUMBAR LAMINECTOMY/DECOMPRESSION MICRODISCECTOMY  12/29/2011   Procedure: LUMBAR LAMINECTOMY/DECOMPRESSION MICRODISCECTOMY;  Surgeon: Floyce Stakes, MD;  Location: [place] [facility];  Service: Neurosurgery;  Laterality: N/A;  Lumbar Two through Lumbar Five Laminectomies/Cellsaver   PARTIAL HYSTERECTOMY     PERIPHERAL VASCULAR BALLOON ANGIOPLASTY  03/03/2020   Procedure: PERIPHERAL VASCULAR BALLOON ANGIOPLASTY;  Surgeon: Marty Heck, MD;  Location: [place] [facility];  Service: Cardiovascular;;  Rt arm fistula   PERIPHERAL VASCULAR BALLOON ANGIOPLASTY Right 04/15/2021   Procedure: PERIPHERAL VASCULAR BALLOON ANGIOPLASTY;  Surgeon: Angelia Mould, MD;  Location: [place] [facility];  Service: Cardiovascular;  Laterality: Right;  arm fistula   PERIPHERAL VASCULAR CATHETERIZATION N/A 11/22/2015   Procedure: Fistulagram;  Surgeon: Angelia Mould, MD;  Location: [place] [facility];  Service: Cardiovascular;  Laterality: N/A;   PERIPHERAL VASCULAR INTERVENTION  08/10/2017   Procedure: PERIPHERAL VASCULAR INTERVENTION;  Surgeon: Elam Dutch, MD;  Location: [place] [facility];  Service: Cardiovascular;;   SHOULDER SURGERY Right    Rotator cuff    Family History  Problem Relation Age of Onset   Heart disease Other    Hypertension Father    Cancer Father    Hypertension Mother    Hypertension Daughter    Pancreatitis Brother    Hypertension Daughter    Diabetes Daughter     Social History   Socioeconomic History   Marital status: Widowed    Spouse name: Not on file   Number of children: 2   Years of education: GED   Highest education level: GED or equivalent  Occupational History   Occupation: Disabled  Tobacco Use   Smoking status: Former    Packs/day: 1.00    Years: 30.00    Pack years:  30.00    Types: Cigarettes    Quit date: 12/04/2001    Years since quitting: 20.0   Smokeless tobacco: Never  Vaping Use   Vaping Use: Never used  Substance and Sexual Activity   Alcohol use: No    Alcohol/week: 6.0 standard drinks    Types: 6 Cans of beer per week   Drug use: No   Sexual activity: Not Currently    Birth control/protection: Surgical  Other Topics Concern   Not on file  Social History Narrative   Lives in [place] with mother   Social Determinants of Health   Financial Resource Strain: Not on file  Food Insecurity: Not on file  Transportation Needs: Not on file  Physical Activity: Not on file  Stress: Not on file  Social Connections:  Not on file  Intimate Partner Violence: Not on file     Physical Exam   Vitals:   12/06/21 0343 12/06/21 0552  BP:  (!) 171/79  Pulse:  85  Resp:  20  Temp:  98.5 F (36.9 C)  SpO2: 92% 97%    CONSTITUTIONAL: Well-appearing, NAD NEURO:  Alert and oriented x 3, no focal deficits EYES:  eyes equal and reactive ENT/NECK:  no LAD, no JVD CARDIO: Regular rate, well-perfused, normal S1 and S2 PULM: Diminished breath sounds at the lung bases GI/GU:  non-distended, non-tender MSK/SPINE:  No gross deformities, no edema SKIN:  no rash, atraumatic   *Additional and/or pertinent findings included in MDM below  Diagnostic and Interventional Summary    EKG Interpretation  Date/Time:  Tuesday December 06 2021 01:10:14 EST Ventricular Rate:  86 PR Interval:  148 QRS Duration: 90 QT Interval:  404 QTC Calculation: 483 R Axis:   109 Text Interpretation: Normal sinus rhythm Septal infarct (cited on or before 20-Feb-2021) Lateral infarct (cited on or before 20-Feb-2021) Abnormal ECG When compared with ECG of 20-Feb-2021 15:09, Premature supraventricular complexes are no longer Present QRS axis Shifted right Questionable change in initial forces of Anterior leads Non-specific change in ST segment in Lateral leads T wave  inversion less evident in Inferior leads Confirmed by Gerlene Fee [phone]) on 12/06/2021 3:24:39 AM       Labs Reviewed  BASIC METABOLIC PANEL - Abnormal; Notable for the following components:      Result Value   Glucose, Bld 143 (*)    BUN 47 (*)    Creatinine, Ser 10.38 (*)    GFR, Estimated 4 (*)    All other components within normal limits  CBC - Abnormal; Notable for the following components:   WBC 15.0 (*)    Hemoglobin 11.3 (*)    HCT 35.8 (*)    RDW 16.0 (*)    All other components within normal limits  BRAIN NATRIURETIC PEPTIDE - Abnormal; Notable for the following components:   B Natriuretic Peptide 2,593.0 (*)    All other components within normal limits  MAGNESIUM - Abnormal; Notable for the following components:   Magnesium 2.5 (*)    All other components within normal limits  PHOSPHORUS - Abnormal; Notable for the following components:   Phosphorus 5.2 (*)    All other components within normal limits  TROPONIN I (HIGH SENSITIVITY) - Abnormal; Notable for the following components:   Troponin I (High Sensitivity) 68 (*)    All other components within normal limits  TROPONIN I (HIGH SENSITIVITY) - Abnormal; Notable for the following components:   Troponin I (High Sensitivity) 59 (*)    All other components within normal limits  RESP PANEL BY RT-PCR (FLU A&B, COVID) ARPGX2  HEMOGLOBIN A1C    DG Chest Port 1 View  Final Result      Medications  enoxaparin (LOVENOX) injection 30 mg (has no administration in time range)  albuterol (PROVENTIL) (2.5 MG/3ML) 0.083% nebulizer solution 2.5 mg (has no administration in time range)  insulin aspart (novoLOG) injection 0-6 Units (has no administration in time range)  insulin aspart (novoLOG) injection 0-5 Units (has no administration in time range)  ipratropium-albuterol (DUONEB) 0.5-2.5 (3) MG/3ML nebulizer solution 3 mL (3 mLs Nebulization Given 12/06/21 0343)     Procedures  /  Critical Care .Critical Care Performed  by: Maudie Flakes, MD Authorized by: Maudie Flakes, MD   Critical care provider statement:  Critical care time (minutes):  35   Critical care was necessary to treat or prevent imminent or life-threatening deterioration of the following conditions:  Respiratory failure (Need for urgent dialysis)   Critical care was time spent personally by me on the following activities:  Development of treatment plan with patient or surrogate, discussions with consultants, evaluation of patient's response to treatment, examination of patient, ordering and review of laboratory studies, ordering and review of radiographic studies, ordering and performing treatments and interventions, pulse oximetry, re-evaluation of patient's condition and review of old charts  ED Course and Medical Decision Making  Initial Impression and Ddx Suspect pulmonary edema due to ESRD or CHF given the orthopnea, COPD may also be playing a role.  Patient is requiring 2 or 3 L nasal cannula to maintain saturations in the low 90s.  Patient PMH that increases complexity of ED encounter: ESRD, CHF  Interpretation of Diagnostics I personally reviewed the EKG and Chest Xray and my interpretation is as follows: EKG is largely unchanged from prior, chest x-ray showing some evidence of pulmonary edema Clinical Course as of 12/06/21 0757  Tue Dec 06, 2021  0324 Troponin I (High Sensitivity)(!): 68 [MB]  0324 B Natriuretic Peptide(!): 2,593.0 [MB]    Clinical Course User Index [MB] Maudie Flakes, MD    Labs notable for significant BNP elevation, supporting the diagnosis of fluid overloaded state.  Patient Reassessment and Ultimate Disposition/Management Excepted for admission by Dr. Josephine Cables of hospitalist service, will undergo dialysis.  Patient management required discussion with the following services or consulting groups:  Hospitalist Service  Complexity of Problems Addressed Chronic illness with severe  exacerbation  Additional Data Reviewed and Analyzed Further history obtained from: Prior cath report/recent echo  Patient Encounter Risk Assessment High:  Consideration of hospitalization  Barth Kirks. [place] Small, [place] [email or web address]  Final Clinical Impressions(s) / ED Diagnoses  No diagnosis found.  ED Discharge Orders     None        Discharge Instructions Discussed with and Provided to Patient:   Discharge Instructions   None      Maudie Flakes, MD 12/06/21 [phone]

## 2021-12-06 NOTE — Consult Note (Incomplete)
Cardiology Consultation:   Patient ID: [name] MRN: [id number]; DOB: 1951-02-12  Admit date: 12/06/2021 Date of Consult: 12/06/2021  PCP:  [name], MD   [facility] Providers Cardiologist:  Minus Breeding, MD   { Click here to update MD or APP on Care Team, Refresh:1}     Patient Profile:   [name] is a 71 y.o. female with a hx of *** who is being seen 12/06/2021 for the evaluation of *** at the request of ***.  History of Present Illness:   [name] ***   Past Medical History:  Diagnosis Date   Acute on chronic diastolic CHF (congestive heart failure) ([place]) 03/05/2015   Grade 2. EF 60-65%   Anemia    hx of   Anxiety    Arthritis    CAD (coronary artery disease)    s/p Stenting in 2005;  [place] 7/09: Vigorous LV function, 2-3+ MR, RI 40%, RI stent patent, proximal-mid RCA 40-50%;  [place] 7/09:  EF 55-65%, trivial MR;  Myoview 11/18/12: EF 66%, normal LV wall motion, mild to moderate anterior ischemia - reviewed by [facility] and felt to rep breast atten (low risk);  E[place] 6/14: mild LVH, EF 55-65%, Gr 2 DD, PASP 44, trivial eff    Chronic kidney disease    CREATININE IS UP--GOING TO KIDNEY MD    Chronic neck pain    COPD (chronic obstructive pulmonary disease) (HCC)    Degenerative joint disease    left shoulder   Diabetic neuropathy (HCC)    Diverticulosis    Esophageal stricture    GERD (gastroesophageal reflux disease)    Hyperlipidemia    Hypertension    IDDM (insulin dependent diabetes mellitus)    Pericardial effusion 03/06/2015   Very small per echo ; pleural nodules seen on CT chest.   Peripheral vascular disease ([place])    Stroke ([place])    "mini- years ago"   Vitamin D deficiency     Past Surgical History:  Procedure Laterality Date   A/V FISTULAGRAM N/A 08/10/2017   Procedure: A/V Fistulagram - Right Arm;  Surgeon: Elam Dutch, MD;  Location: [place] [facility];  Service: Cardiovascular;  Laterality: N/A;   A/V FISTULAGRAM N/A 03/03/2020    Procedure: A/V FISTULAGRAM - Right Arm;  Surgeon: Marty Heck, MD;  Location: [place] [facility];  Service: Cardiovascular;  Laterality: N/A;   ABDOMINAL HYSTERECTOMY     AV FISTULA PLACEMENT Right 07/20/2015   Procedure: RADIAL CEPHALIC ARTERIOVENOUS FISTULA CREATION RIGHT ARM;  Surgeon: Angelia Mould, MD;  Location: [place];  Service: Vascular;  Laterality: Right;   AV FISTULA PLACEMENT Right 11/26/2015   Procedure:  Creation of RIGHT BRACHIO-CEPHALIC arteriovenous fistula;  Surgeon: Angelia Mould, MD;  Location: [facility];  Service: Vascular;  Laterality: Right;   BACK SURGERY     2007 & 2013   [place]   CARDIAC CATHETERIZATION  2003, 2005, 2009   1 stent   cardiac stents     CATARACT EXTRACTION W/PHACO Right 05/04/2014   Procedure: CATARACT EXTRACTION PHACO AND INTRAOCULAR LENS PLACEMENT ([place]);  Surgeon: Williams Che, MD;  [facility];  Service: Ophthalmology;  Laterality: Right;  CDE 1.47   CATARACT EXTRACTION W/PHACO Left 07/20/2014   Procedure: CATARACT EXTRACTION WITH PHACO AND INTRAOCULAR LENS PLACEMENT LEFT EYE CDE=1.79;  Surgeon: Williams Che, MD;  [facility];  Service: Ophthalmology;  Laterality: Left;   CERVICAL SPINE SURGERY  2012   8 screws   CYSTOSCOPY WITH INJECTION N/A 06/03/2013   Procedure: MACROPLASTIQUE  INJECTION ;  Surgeon: Malka So, MD;  [facility];  Service: Urology;  Laterality: N/A;   FISTULOGRAM Right 10/20/2016   Procedure: FISTULOGRAM WITH POSSIBLE INTERVENTION;  Surgeon: [name], MD;  [facility];  Service: Vascular;  Laterality: Right;   INSERTION OF DIALYSIS CATHETER N/A 11/26/2015   Procedure: INSERTION OF DIALYSIS CATHETER;  Surgeon: Angelia Mould, MD;  Location: [place];  Service: Vascular;  Laterality: N/A;   LAMINECTOMY  12/29/2011   LUMBAR LAMINECTOMY/DECOMPRESSION MICRODISCECTOMY  12/29/2011   Procedure: LUMBAR LAMINECTOMY/DECOMPRESSION MICRODISCECTOMY;  Surgeon: Floyce Stakes, MD;  Location: [place] [facility];  Service: Neurosurgery;  Laterality: N/A;  Lumbar Two through Lumbar Five Laminectomies/Cellsaver   PARTIAL HYSTERECTOMY     PERIPHERAL VASCULAR BALLOON ANGIOPLASTY  03/03/2020   Procedure: PERIPHERAL VASCULAR BALLOON ANGIOPLASTY;  Surgeon: Marty Heck, MD;  Location: [place] [facility];  Service: Cardiovascular;;  Rt arm fistula   PERIPHERAL VASCULAR BALLOON ANGIOPLASTY Right 04/15/2021   Procedure: PERIPHERAL VASCULAR BALLOON ANGIOPLASTY;  Surgeon: Angelia Mould, MD;  Location: [place] [facility];  Service: Cardiovascular;  Laterality: Right;  arm fistula   PERIPHERAL VASCULAR CATHETERIZATION N/A 11/22/2015   Procedure: Fistulagram;  Surgeon: Angelia Mould, MD;  Location: [place] [facility];  Service: Cardiovascular;  Laterality: N/A;   PERIPHERAL VASCULAR INTERVENTION  08/10/2017   Procedure: PERIPHERAL VASCULAR INTERVENTION;  Surgeon: Elam Dutch, MD;  Location: [place] [facility];  Service: Cardiovascular;;   SHOULDER SURGERY Right    Rotator cuff     {Home Medications (Optional):21181}  Inpatient Medications: Scheduled Meds:  aspirin EC  81 mg Oral Daily   atenolol  25 mg Oral Daily   enoxaparin (LOVENOX) injection  30 mg Subcutaneous Q24H   gabapentin  400 mg Oral See admin instructions   insulin aspart  0-5 Units Subcutaneous QHS   insulin aspart  0-6 Units Subcutaneous TID WC   insulin glargine-yfgn  10 Units Subcutaneous QHS   mometasone-formoterol  2 puff Inhalation BID   NIFEdipine  60 mg Oral Daily   pantoprazole  40 mg Oral Daily   rOPINIRole  1 mg Oral QHS   rosuvastatin  5 mg Oral Daily   Continuous Infusions:  PRN Meds: albuterol, albuterol, albuterol, nitroGLYCERIN  Allergies:    Allergies  Allergen Reactions   Azithromycin     Prolonged QT   Ace Inhibitors Cough   Clopidogrel Bisulfate Other (See Comments)    Sick, Headache, "Felt terrible"   Codeine Other (See Comments)    hallucinations    Lisinopril Cough   Penicillins Other (See Comments)    Unknown reaction Did it involve swelling of the face/tongue/throat, SOB, or low BP? Unknown Did it involve sudden or severe rash/hives, skin peeling, or any reaction on the inside of your mouth or nose? Unknown Did you need to seek medical attention at a hospital or doctor's office? Unknown When did it last happen?      childhood allergy If all above answers are "NO", may proceed with cephalosporin use.      Social History:   Social History   Socioeconomic History   Marital status: Widowed    Spouse name: Not on file   Number of children: 2   Years of education: GED   Highest education level: GED or equivalent  Occupational History   Occupation: Disabled  Tobacco Use  Smoking status: Former    Packs/day: 1.00    Years: 30.00    Pack years: 30.00    Types: Cigarettes    Quit date: 12/04/2001    Years since quitting: 20.0   Smokeless tobacco: Never  Vaping Use   Vaping Use: Never used  Substance and Sexual Activity   Alcohol use: No    Alcohol/week: 6.0 standard drinks    Types: 6 Cans of beer per week   Drug use: No   Sexual activity: Not Currently    Birth control/protection: Surgical  Other Topics Concern   Not on file  Social History Narrative   Lives in [place] with mother   Social Determinants of Health   Financial Resource Strain: Not on file  Food Insecurity: Not on file  Transportation Needs: Not on file  Physical Activity: Not on file  Stress: Not on file  Social Connections: Not on file  Intimate Partner Violence: Not on file    Family History:   *** Family History  Problem Relation Age of Onset   Heart disease Other    Hypertension Father    Cancer Father    Hypertension Mother    Hypertension Daughter    Pancreatitis Brother    Hypertension Daughter    Diabetes Daughter      ROS:  Please see the history of present illness.  *** All other ROS reviewed and negative.      Physical Exam/Data:   Vitals:   12/06/21 0330 12/06/21 0343 12/06/21 0552 12/06/21 0801  BP: (!) 167/63  (!) 171/79   Pulse: 85  85   Resp: (!) 28  20   Temp:   98.5 F (36.9 C)   TempSrc:   Oral   SpO2: 94% 92% 97% 96%  Weight:      Height:       No intake or output data in the 24 hours ending 12/06/21 0818 Last 3 Weights 12/06/2021 11/09/2021 09/15/2021  Weight (lbs) 185 lb 197 lb 192 lb 12.8 oz  Weight (kg) 83.915 kg 89.359 kg 87.454 kg     Body mass index is 32.77 kg/m.  General:  Well nourished, well developed, in no acute distress*** HEENT: normal Neck: no JVD Vascular: No carotid bruits; Distal pulses 2+ bilaterally Cardiac:  normal S1, S2; RRR; no murmur *** Lungs:  clear to auscultation bilaterally, no wheezing, rhonchi or rales  Abd: soft, nontender, no hepatomegaly  Ext: no edema Musculoskeletal:  No deformities, BUE and BLE strength normal and equal Skin: warm and dry  Neuro:  CNs 2-12 intact, no focal abnormalities noted Psych:  Normal affect   EKG:  The EKG was personally reviewed and demonstrates:  *** Telemetry:  Telemetry was personally reviewed and demonstrates:  ***  Relevant CV Studies: ***  Laboratory Data:  High Sensitivity Troponin:   Recent Labs  Lab 12/06/21 0137 12/06/21 0438  TROPONINIHS 68* 59*     Chemistry Recent Labs  Lab 12/06/21 0137 12/06/21 0438  NA 139  --   K 3.6  --   CL 99  --   CO2 27  --   GLUCOSE 143*  --   BUN 47*  --   CREATININE 10.38*  --   CALCIUM 10.1  --   MG  --  2.5*  GFRNONAA 4*  --   ANIONGAP 13  --     No results for input(s): PROT, ALBUMIN, AST, ALT, ALKPHOS, BILITOT in the last 168 hours. Lipids No results for  input(s): CHOL, TRIG, HDL, LABVLDL, LDLCALC, CHOLHDL in the last 168 hours.  Hematology Recent Labs  Lab 12/06/21 0137  WBC 15.0*  RBC 3.96  HGB 11.3*  HCT 35.8*  MCV 90.4  MCH 28.5  MCHC 31.6  RDW 16.0*  PLT 272   Thyroid No results for input(s): TSH, FREET4 in the last  168 hours.  BNP Recent Labs  Lab 12/06/21 0137  BNP 2,593.0*    DDimer No results for input(s): DDIMER in the last 168 hours.   Radiology/Studies:  DG Chest Port 1 View  Result Date: 12/06/2021 CLINICAL DATA:  Dyspnea EXAM: PORTABLE CHEST 1 VIEW COMPARISON:  02/19/2021 FINDINGS: The lungs are symmetrically well expanded. Stable cardiomegaly. Interval development of mild perihilar and lower lung zone interstitial pulmonary infiltrate possibly representing cardiogenic pulmonary edema. No pneumothorax. Possible small bilateral pleural effusions. No acute bone abnormality. IMPRESSION: Stable cardiomegaly. Suspected mild superimposed cardiogenic failure. Electronically Signed   By: Fidela Salisbury M.D.   On: 12/06/2021 03:05     Assessment and Plan:   ***   Risk Assessment/Risk Scores:  {Complete the following score calculators/questions to meet required metrics.  Press F2         :[id number]}   {Is the patient being seen for CHEST PAIN, UNSTABLE ANGINA, NSTEMI or STEMI?     :[id number]} {Does this patient have CHF or CHF symptoms?      :[id number]} {Does this patient have ATRIAL FIBRILLATION?:[id number]}  {Are we signing off today?:[id number]}  For questions or updates, please contact [place] Please consult [email or web address] for contact info under    Signed, Carlyle Dolly, MD  12/06/2021 8:18 AM

## 2021-12-06 NOTE — ED Triage Notes (Signed)
Pt states she got sob around 2130. Pt's last dialysis was Saturday.

## 2021-12-06 NOTE — Progress Notes (Signed)
*  PRELIMINARY RESULTS* Echocardiogram Limited 2D Echocardiogram has been performed.  Elpidio Anis 12/06/2021, 10:10 AM

## 2021-12-06 NOTE — H&P (Signed)
History and Physical  [name] ULA:[id number] DOB: 11-Mar-1951 DOA: 12/06/2021  Referring physician:, Maudie Flakes, MD PCP: [name], MD  Patient coming from: Home  Chief Complaint: Shortness of breath  HPI: [name] is a 71 y.o. female with medical history significant for ESRD HD (TThS),, essential hypertension, hyperlipidemia, CAD s/p stent placement, COPD, GERD who presents to the emergency department due to shortness of breath which started yesterday around 930 to 10 PM.  Patient states that she went to bed to try to sleep, but on lying down, she felt a significant shortness of breath and she quickly had to sit up.  However, shortness of breath continue to worsen and she endorsed leg swelling and bilateral hand swelling within last couple of days.  Last dialysis was on Saturday (12/03/2021).  He denies fever, chills, chest pain, blurry vision, nausea, vomiting, abdominal pain.  ED Course:  In the emergency department, she was tachypneic, BP was 177/78.  O2 sats was in the 90s on supplemental oxygen at 3 LPM.  Work-up in the ED showed leukocytosis, normocytic anemia, BUN/creatinine 47/10.38, hyperglycemia, BNP 2593 troponin x2 -68 > 59, phosphorus 5.2, magnesium 2.5.  Influenza A, B, SARS coronavirus 2 was negative. Chest x-ray showed Stable cardiomegaly. Suspected mild superimposed cardiogenic failure. Breathing treatment was provided, hospitalist was asked to admit patient for further evaluation and management.  Review of Systems: A full 10 point Review of Systems was done, except as stated above, all other Review of systems were negative.   Past Medical History:  Diagnosis Date   Acute on chronic diastolic CHF (congestive heart failure) ([place]) 03/05/2015   Grade 2. EF 60-65%   Anemia    hx of   Anxiety    Arthritis    CAD (coronary artery disease)    s/p Stenting in 2005;  [place] 7/09: Vigorous LV function, 2-3+ MR, RI 40%, RI stent patent, proximal-mid RCA 40-50%;   [place] 7/09:  EF 55-65%, trivial MR;  [place] 11/18/12: EF 66%, normal LV wall motion, mild to moderate anterior ischemia - reviewed by [facility] and felt to rep breast atten (low risk);  Echo 6/14: mild LVH, EF 55-65%, Gr 2 DD, PASP 44, trivial eff    Chronic kidney disease    CREATININE IS UP--GOING TO KIDNEY MD    Chronic neck pain    COPD (chronic obstructive pulmonary disease) (HCC)    Degenerative joint disease    left shoulder   Diabetic neuropathy (HCC)    Diverticulosis    Esophageal stricture    GERD (gastroesophageal reflux disease)    Hyperlipidemia    Hypertension    IDDM (insulin dependent diabetes mellitus)    Pericardial effusion 03/06/2015   Very small per echo ; pleural nodules seen on CT chest.   Peripheral vascular disease ([place])    Stroke ([place])    "mini- years ago"   Vitamin D deficiency    Past Surgical History:  Procedure Laterality Date   A/V FISTULAGRAM N/A 08/10/2017   Procedure: A/V Fistulagram - Right Arm;  Surgeon: Elam Dutch, MD;  Location: [place] [facility];  Service: Cardiovascular;  Laterality: N/A;   A/V FISTULAGRAM N/A 03/03/2020   Procedure: A/V FISTULAGRAM - Right Arm;  Surgeon: Marty Heck, MD;  Location: [place] [facility];  Service: Cardiovascular;  Laterality: N/A;   ABDOMINAL HYSTERECTOMY     AV FISTULA PLACEMENT Right 07/20/2015   Procedure: RADIAL CEPHALIC ARTERIOVENOUS FISTULA CREATION RIGHT ARM;  Surgeon: Judeth Cornfield  Scot Dock, MD;  Location: [facility];  Service: Vascular;  Laterality: Right;   AV FISTULA PLACEMENT Right 11/26/2015   Procedure:  Creation of RIGHT BRACHIO-CEPHALIC arteriovenous fistula;  Surgeon: Angelia Mould, MD;  Location: [facility] OR;  Service: Vascular;  Laterality: Right;   BACK SURGERY     2007 & 2013   [place]   CARDIAC CATHETERIZATION  2003, 2005, 2009   1 stent   cardiac stents     CATARACT EXTRACTION W/PHACO Right 05/04/2014   Procedure: CATARACT EXTRACTION PHACO AND INTRAOCULAR LENS  PLACEMENT ([place]);  Surgeon: Williams Che, MD;  Location: [facility];  Service: Ophthalmology;  Laterality: Right;  CDE 1.47   CATARACT EXTRACTION W/PHACO Left 07/20/2014   Procedure: CATARACT EXTRACTION WITH PHACO AND INTRAOCULAR LENS PLACEMENT LEFT EYE CDE=1.79;  Surgeon: Williams Che, MD;  Location: [facility];  Service: Ophthalmology;  Laterality: Left;   CERVICAL SPINE SURGERY  2012   8 screws   CYSTOSCOPY WITH INJECTION N/A 06/03/2013   Procedure: MACROPLASTIQUE  INJECTION ;  Surgeon: Malka So, MD;  Location: [facility];  Service: Urology;  Laterality: N/A;   FISTULOGRAM Right 10/20/2016   Procedure: FISTULOGRAM WITH POSSIBLE INTERVENTION;  Surgeon: [name], MD;  Location: [facility];  Service: Vascular;  Laterality: Right;   INSERTION OF DIALYSIS CATHETER N/A 11/26/2015   Procedure: INSERTION OF DIALYSIS CATHETER;  Surgeon: Angelia Mould, MD;  Location: [place];  Service: Vascular;  Laterality: N/A;   LAMINECTOMY  12/29/2011   LUMBAR LAMINECTOMY/DECOMPRESSION MICRODISCECTOMY  12/29/2011   Procedure: LUMBAR LAMINECTOMY/DECOMPRESSION MICRODISCECTOMY;  Surgeon: Floyce Stakes, MD;  Location: [place] [facility] ORS;  Service: Neurosurgery;  Laterality: N/A;  Lumbar Two through Lumbar Five Laminectomies/Cellsaver   PARTIAL HYSTERECTOMY     PERIPHERAL VASCULAR BALLOON ANGIOPLASTY  03/03/2020   Procedure: PERIPHERAL VASCULAR BALLOON ANGIOPLASTY;  Surgeon: Marty Heck, MD;  Location: [place] [facility];  Service: Cardiovascular;;  Rt arm fistula   PERIPHERAL VASCULAR BALLOON ANGIOPLASTY Right 04/15/2021   Procedure: PERIPHERAL VASCULAR BALLOON ANGIOPLASTY;  Surgeon: Angelia Mould, MD;  Location: [place] [facility];  Service: Cardiovascular;  Laterality: Right;  arm fistula   PERIPHERAL VASCULAR CATHETERIZATION N/A 11/22/2015   Procedure: Fistulagram;  Surgeon: Angelia Mould, MD;  Location: [place] [facility];  Service: Cardiovascular;  Laterality: N/A;   PERIPHERAL VASCULAR  INTERVENTION  08/10/2017   Procedure: PERIPHERAL VASCULAR INTERVENTION;  Surgeon: Elam Dutch, MD;  Location: [place] [facility];  Service: Cardiovascular;;   SHOULDER SURGERY Right    Rotator cuff    Social History:  reports that she quit smoking about 20 years ago. Her smoking use included cigarettes. She has a 30.00 pack-year smoking history. She has never used smokeless tobacco. She reports that she does not drink alcohol and does not use drugs.   Allergies  Allergen Reactions   Azithromycin     Prolonged QT   Ace Inhibitors Cough   Clopidogrel Bisulfate Other (See Comments)    Sick, Headache, "Felt terrible"   Codeine Other (See Comments)    hallucinations   Lisinopril Cough   Penicillins Other (See Comments)    Unknown reaction Did it involve swelling of the face/tongue/throat, SOB, or low BP? Unknown Did it involve sudden or severe rash/hives, skin peeling, or any reaction on the inside of your mouth or nose? Unknown Did you need to seek medical attention at a hospital or doctor's office? Unknown When did it last  happen?      childhood allergy If all above answers are "NO", may proceed with cephalosporin use.      Family History  Problem Relation Age of Onset   Heart disease Other    Hypertension Father    Cancer Father    Hypertension Mother    Hypertension Daughter    Pancreatitis Brother    Hypertension Daughter    Diabetes Daughter     Prior to Admission medications   Medication Sig Start Date End Date Taking? Authorizing Provider  acetaminophen (TYLENOL) 500 MG tablet Take 1,000 mg by mouth every 6 (six) hours as needed for moderate pain or headache.    [provider]  albuterol (PROVENTIL) (2.5 MG/3ML) 0.083% nebulizer solution Take 3 mLs (2.5 mg total) by nebulization every 6 (six) hours as needed for wheezing or shortness of breath. 12/02/21   [name], MD  albuterol (VENTOLIN HFA) 108 (90 Base) MCG/ACT inhaler Inhale 2 puffs into the  lungs every 6 (six) hours as needed for wheezing or shortness of breath. 12/02/21   [name], MD  aspirin 81 MG EC tablet Take 81 mg by mouth daily.    [provider]  atenolol (TENORMIN) 50 MG tablet Take 1 tablet (50 mg total) by mouth 2 (two) times daily. 09/15/21   [name], MD  budesonide-formoterol (SYMBICORT) 160-4.5 MCG/ACT inhaler Inhale 2 puffs into the lungs 2 (two) times daily. 11/15/21   [name], MD  busPIRone (BUSPAR) 7.5 MG tablet Take 1 tablet (7.5 mg total) by mouth 3 (three) times daily as needed. 09/15/21   [name], MD  calcium acetate (PHOSLO) 667 MG capsule Take 667-1,334 mg by mouth See admin instructions. Take 1334 mg with meals 3 times daily and 667 mg with snacks    [provider]  Carboxymethylcellul-Glycerin (LUBRICATING EYE DROPS OP) Place 1 drop into both eyes daily as needed (dry eyes).    [provider]  cetirizine (ZYRTEC) 10 MG tablet Take 1 tablet (10 mg total) by mouth daily as needed for allergies. Patient not taking: Reported on 11/09/2021 09/15/21   [name], MD  Continuous Blood Gluc Receiver (FREESTYLE LIBRE 2 READER) DEVI USE TO TEST BLOOD SUGAR CONTINUOUSLY 11/15/21   [name], MD  Continuous Blood Gluc Sensor (FREESTYLE LIBRE 2 SENSOR) MISC Use multiple times daily to track glucose to prevent highs and lows as a complication of dialysis Dx:Z99.2, E11.59 09/15/21   [name], MD  DULoxetine (CYMBALTA) 20 MG capsule Take by mouth. 06/26/21   [provider]  gabapentin (NEURONTIN) 400 MG capsule Take 1 capsule (400 mg total) by mouth See admin instructions. Take it after HD 02/21/21   Barton Dubois, MD  glucose blood (ONETOUCH ULTRA) test strip Use to test blood sugar 3 times daily as directed. DX: E11.9 09/13/20   [name], MD  Insulin Glargine [facility]) 100 UNIT/ML Inject 25 Units into the skin at bedtime. Add five units each time the fasting is over 125 three days  in a row. 11/15/21   [name], MD  montelukast (SINGULAIR) 10 MG tablet Take by mouth. 06/26/21   [provider]  NIFEdipine (PROCARDIA XL/NIFEDICAL XL) 60 MG 24 hr tablet TAKE 1 TABLET BY MOUTH ONCE DAILY (ON  NON-DIALYSIS  DAYS) 11/09/21   Minus Breeding, MD  nitroGLYCERIN (NITROSTAT) 0.4 MG SL tablet Place 1 tablet (0.4 mg total) under the tongue every 5 (five) minutes as needed for chest pain. 11/09/21   Minus Breeding, MD  omeprazole (PRILOSEC) 20 MG capsule Take 1 capsule (20 mg total) by mouth daily. 08/26/21   Ivy Lynn, NP  ondansetron (ZOFRAN) 4 MG tablet TAKE 1 TABLET BY MOUTH EVERY 8 HOURS AS NEEDED FOR NAUSEA AND VOMITING 11/10/21   [name], MD  QUEtiapine (SEROQUEL) 50 MG tablet Take 1 tablet (50 mg total) by mouth at bedtime. For sleep and for anxiety 06/08/21   [name], MD  rOPINIRole (REQUIP) 1 MG tablet Take 1 tablet (1 mg total) by mouth at bedtime. For leg cramps 09/15/21   [name], MD  rosuvastatin (CRESTOR) 5 MG tablet Take 1 tablet (5 mg total) by mouth daily. For cholesterol 02/04/21   [name], MD  Semaglutide,0.25 or 0.5MG /DOS, (OZEMPIC, 0.25 OR 0.5 MG/DOSE,) 2 MG/1.5ML SOPN Inject 0.25 mg into the skin.    [provider]    Physical Exam: BP (!) 171/79 (BP Location: Left Arm)    Pulse 85    Temp 98.5 F (36.9 C) (Oral)    Resp 20    Ht 5\' 3"  (1.6 m)    Wt 83.9 kg    SpO2 97%    BMI 32.77 kg/m   General: 71 y.o. year-old female well developed well nourished in mild acute distress.  Alert and oriented x3. HEENT: NCAT, EOMI Neck: Supple, trachea medial Cardiovascular: Regular rate and rhythm with no rubs or gallops.  +2 lower extremity edema. 2/4 pulses in all 4 extremities. Respiratory: Diffuse bilateral rhonchi with rales in the lower lobes Abdomen: Soft, nontender nondistended with normal bowel sounds x4 quadrants. Muskuloskeletal: No cyanosis or clubbing  noted bilaterally Neuro: CN II-XII intact, strength 5/5 x  4, sensation, reflexes intact Skin: No ulcerative lesions noted or rashes Psychiatry: Judgement and insight appear normal. Mood is appropriate for condition and setting          Labs on Admission:  Basic Metabolic Panel: Recent Labs  Lab 12/06/21 0137 12/06/21 0438  NA 139  --   K 3.6  --   CL 99  --   CO2 27  --   GLUCOSE 143*  --   BUN 47*  --   CREATININE 10.38*  --   CALCIUM 10.1  --   MG  --  2.5*  PHOS  --  5.2*   Liver Function Tests: No results for input(s): AST, ALT, ALKPHOS, BILITOT, PROT, ALBUMIN in the last 168 hours. No results for input(s): LIPASE, AMYLASE in the last 168 hours. No results for input(s): AMMONIA in the last 168 hours. CBC: Recent Labs  Lab 12/06/21 0137  WBC 15.0*  HGB 11.3*  HCT 35.8*  MCV 90.4  PLT 272   Cardiac Enzymes: No results for input(s): CKTOTAL, CKMB, CKMBINDEX, TROPONINI in the last 168 hours.  BNP (last 3 results) Recent Labs    12/06/21 0137  BNP 2,593.0*    ProBNP (last 3 results) No results for input(s): PROBNP in the last 8760 hours.  CBG: No results for input(s): GLUCAP in the last 168 hours.  Radiological Exams on Admission: DG Chest Port 1 View  Result Date: 12/06/2021 CLINICAL DATA:  Dyspnea EXAM: PORTABLE CHEST 1 VIEW COMPARISON:  02/19/2021 FINDINGS: The lungs are symmetrically well expanded. Stable cardiomegaly. Interval development of mild perihilar and lower lung zone interstitial pulmonary infiltrate possibly representing cardiogenic pulmonary edema. No pneumothorax. Possible small bilateral pleural effusions. No acute bone abnormality. IMPRESSION: Stable cardiomegaly. Suspected mild superimposed cardiogenic failure. Electronically Signed   By: Linwood Dibbles.D.  On: 12/06/2021 03:05    EKG: I independently viewed the EKG done and my findings are as followed: Normal sinus rhythm at a rate of 86 bpm  Assessment/Plan Present on Admission:  Type 2 diabetes mellitus with hyperglycemia ([place])  Coronary  artery disease involving native coronary artery of native heart without angina pectoris  Principal Problem:   Acute CHF (congestive heart failure) ([place]) Active Problems:   COPD (chronic obstructive pulmonary disease) (HCC)   Coronary artery disease involving native coronary artery of native heart without angina pectoris   Anemia of chronic disease   ESRD (end stage renal disease) on dialysis (HCC)   Obesity (BMI 30.0-34.9)   Essential hypertension   Mixed hyperlipidemia   Type 2 diabetes mellitus with hyperglycemia (HCC)   Leukocytosis   Elevated brain natriuretic peptide (BNP) level   Elevated troponin   GERD (gastroesophageal reflux disease)   Acute CHF possibly secondary to volume overload Elevated BNP Continue total input/output, daily weights and fluid restriction Nephrology consult was placed for hemodialysis Continue Cardiac diet  Echocardiogram will be done  in the morning  Cardiology will be consulted  Elevated troponin possibly secondary to type II demand ischemia Troponin already flattened out  ESRD on HD (TThS) Last HD was on Saturday (12/03/2021), she is due for dialysis today Consult was placed for nephrology  Leukocytosis possibly reactive WBC 15.0, low suspicion for acute infectious process at this time Continue to monitor WBC  Anemia of chronic disease Hemoglobin stable 11.3; continue to monitor  Hyperglycemia secondary to type II DM Continue ISS and hypoglycemic protocol Continue Semglee 10 units (patient uses 25 units of glargine) and titrate dose accordingly  Essential hypertension (uncontrolled Continue atenolol and nifedipine  CAD s/p stent placement Continue aspirin, nitroglycerin, Crestor  Mixed hyperlipidemia Continue Crestor  COPD Continue albuterol  GERD Continue Protonix  Diabetic neuropathy Continue gabapentin  Leg cramps Continue ropinirole  Echocardiogram done on 09/16/2021  IMPRESSIONS    1. Left ventricular  ejection fraction, by estimation, is 55 to 60%. The  left ventricle has normal function. The left ventricle has no regional wall motion abnormalities. Left ventricular diastolic function could not be evaluated. There is the interventricular septum is flattened in systole and diastole, consistent with right ventricular pressure and volume overload.   2. Right ventricular systolic function is mildly reduced. The right ventricular size is normal. There is mildly elevated pulmonary artery systolic pressure. The estimated right ventricular systolic pressure is 38.2 mmHg.   3. Left atrial size was mildly dilated.   4. Right atrial size was mild to moderately dilated.   5. Moderate pericardial effusion. The pericardial effusion is posterior to the left ventricle. There is no evidence of cardiac tamponade.   6. The mitral valve is degenerative. Mild mitral valve regurgitation. No evidence of mitral stenosis.   7. The aortic valve is tricuspid. There is mild calcification of the aortic valve. There is mild thickening of the aortic valve. Aortic valve regurgitation is not visualized. Mild aortic valve sclerosis is present, ith no evidence of aortic valve stenosis.   8. The inferior vena cava is dilated in size with >50% respiratory variability, suggesting right atrial pressure of 8 mmHg.    DVT prophylaxis: Lovenox  Code Status: Full code  Family Communication: None at bedside  Disposition Plan:  Patient is from:                        home Anticipated DC to:  SNF or family members home Anticipated DC date:               2-3 days Anticipated DC barriers:          Patient requires inpatient due to CHF possibly due to requiring hemodialysis and pending nephrology and cardiology consults   Consults called: Nephrology, cardiology  Admission status: Inpatient    Bernadette Hoit MD [facility]  12/06/2021, 7:44 AM

## 2021-12-06 NOTE — ED Notes (Signed)
Pt ambulated to restroom with steady gait.

## 2021-12-07 ENCOUNTER — Encounter (HOSPITAL_COMMUNITY): Payer: Self-pay | Admitting: Internal Medicine

## 2021-12-07 DIAGNOSIS — N186 End stage renal disease: Secondary | ICD-10-CM | POA: Diagnosis not present

## 2021-12-07 DIAGNOSIS — Z992 Dependence on renal dialysis: Secondary | ICD-10-CM | POA: Diagnosis not present

## 2021-12-07 DIAGNOSIS — I5031 Acute diastolic (congestive) heart failure: Secondary | ICD-10-CM | POA: Diagnosis not present

## 2021-12-07 LAB — RENAL FUNCTION PANEL
Albumin: 3.4 g/dL — ABNORMAL LOW (ref 3.5–5.0)
Anion gap: 12 (ref 5–15)
BUN: 32 mg/dL — ABNORMAL HIGH (ref 8–23)
CO2: 27 mmol/L (ref 22–32)
Calcium: 8.4 mg/dL — ABNORMAL LOW (ref 8.9–10.3)
Chloride: 94 mmol/L — ABNORMAL LOW (ref 98–111)
Creatinine, Ser: 8.06 mg/dL — ABNORMAL HIGH (ref 0.44–1.00)
GFR, Estimated: 5 mL/min — ABNORMAL LOW (ref >60)
Glucose, Bld: 190 mg/dL — ABNORMAL HIGH (ref 70–99)
Phosphorus: 4.1 mg/dL (ref 2.5–4.6)
Potassium: 3.3 mmol/L — ABNORMAL LOW (ref 3.5–5.1)
Sodium: 133 mmol/L — ABNORMAL LOW (ref 135–145)

## 2021-12-07 LAB — GLUCOSE, CAPILLARY
Glucose-Capillary: 133 mg/dL — ABNORMAL HIGH (ref 70–99)
Glucose-Capillary: 159 mg/dL — ABNORMAL HIGH (ref 70–99)
Glucose-Capillary: 223 mg/dL — ABNORMAL HIGH (ref 70–99)

## 2021-12-07 LAB — CBC
HCT: 29.6 % — ABNORMAL LOW (ref 36.0–46.0)
HCT: 30.2 % — ABNORMAL LOW (ref 36.0–46.0)
Hemoglobin: 9.4 g/dL — ABNORMAL LOW (ref 12.0–15.0)
Hemoglobin: 9.4 g/dL — ABNORMAL LOW (ref 12.0–15.0)
MCH: 28.2 pg (ref 26.0–34.0)
MCH: 27.7 pg (ref 26.0–34.0)
MCHC: 31.8 g/dL (ref 30.0–36.0)
MCHC: 31.1 g/dL (ref 30.0–36.0)
MCV: 88.9 fL (ref 80.0–100.0)
MCV: 89.1 fL (ref 80.0–100.0)
Platelets: 193 10*3/uL (ref 150–400)
Platelets: 192 10*3/uL (ref 150–400)
RBC: 3.33 MIL/uL — ABNORMAL LOW (ref 3.87–5.11)
RBC: 3.39 MIL/uL — ABNORMAL LOW (ref 3.87–5.11)
RDW: 15.9 % — ABNORMAL HIGH (ref 11.5–15.5)
RDW: 15.9 % — ABNORMAL HIGH (ref 11.5–15.5)
WBC: 12.8 10*3/uL — ABNORMAL HIGH (ref 4.0–10.5)
WBC: 12.3 10*3/uL — ABNORMAL HIGH (ref 4.0–10.5)
nRBC: 0 % (ref 0.0–0.2)
nRBC: 0 % (ref 0.0–0.2)

## 2021-12-07 LAB — COMPREHENSIVE METABOLIC PANEL
ALT: 13 U/L (ref 0–44)
AST: 12 U/L — ABNORMAL LOW (ref 15–41)
Albumin: 3.2 g/dL — ABNORMAL LOW (ref 3.5–5.0)
Alkaline Phosphatase: 62 U/L (ref 38–126)
Anion gap: 11 (ref 5–15)
BUN: 29 mg/dL — ABNORMAL HIGH (ref 8–23)
CO2: 29 mmol/L (ref 22–32)
Calcium: 8.5 mg/dL — ABNORMAL LOW (ref 8.9–10.3)
Chloride: 96 mmol/L — ABNORMAL LOW (ref 98–111)
Creatinine, Ser: 7.49 mg/dL — ABNORMAL HIGH (ref 0.44–1.00)
GFR, Estimated: 5 mL/min — ABNORMAL LOW (ref >60)
Glucose, Bld: 129 mg/dL — ABNORMAL HIGH (ref 70–99)
Potassium: 3.2 mmol/L — ABNORMAL LOW (ref 3.5–5.1)
Sodium: 136 mmol/L (ref 135–145)
Total Bilirubin: 0.3 mg/dL (ref 0.3–1.2)
Total Protein: 6.2 g/dL — ABNORMAL LOW (ref 6.5–8.1)

## 2021-12-07 LAB — HEPATITIS B SURFACE ANTIBODY, QUANTITATIVE: Hep B S AB Quant (Post): 18.3 m[IU]/mL (ref >9.9)

## 2021-12-07 MED ORDER — ACETAMINOPHEN 325 MG PO TABS
650.0000 mg | ORAL_TABLET | Freq: Four times a day (QID) | ORAL | Status: DC | PRN
  Administered 2021-12-07: 650 mg via ORAL
  Filled 2021-12-07: qty 2

## 2021-12-07 MED ORDER — LORAZEPAM 2 MG/ML IJ SOLN
0.5000 mg | Freq: Once | INTRAMUSCULAR | Status: AC
  Administered 2021-12-07: 0.5 mg via INTRAVENOUS
  Filled 2021-12-07: qty 1

## 2021-12-07 MED ORDER — HEPARIN SODIUM (PORCINE) 1000 UNIT/ML DIALYSIS: 20.0000 [IU]/kg | INTRAMUSCULAR | Status: DC | PRN

## 2021-12-07 MED ORDER — HYDROXYZINE HCL 10 MG PO TABS
10.0000 mg | ORAL_TABLET | Freq: Once | ORAL | Status: DC
  Filled 2021-12-07: qty 1

## 2021-12-07 NOTE — Progress Notes (Signed)
°  Transition of Care [facility]) Screening Note   Patient Details  Name: EFRATA BRUNNER Date of Birth: 1950/12/07   Transition of Care [facility]) CM/SW Contact:    Boneta Lucks, RN Phone Number: 12/07/2021, 11:48 AM    Transition of Care Department [facility]) has reviewed patient and no TOC needs have been identified at this time. We will continue to monitor patient advancement through interdisciplinary progression rounds. If new patient transition needs arise, please place a TOC consult.

## 2021-12-07 NOTE — Progress Notes (Addendum)
Progress Note    [name]  UXL:[id number] DOB: October 10, 1951  DOA: 12/06/2021 PCP: [name], MD      Brief Narrative:    Medical records reviewed and are as summarized below:  [name] is a 71 y.o. female  with medical history significant for ESRD HD (TThS),, essential hypertension, hyperlipidemia, CAD s/p stent placement, COPD, GERD, who presented to the hospital with shortness of breath, orthopnea, bilateral hand and leg swelling.      Assessment/Plan:   Principal Problem:   Acute CHF (congestive heart failure) (HCC) Active Problems:   COPD (chronic obstructive pulmonary disease) (HCC)   Coronary artery disease involving native coronary artery of native heart without angina pectoris   Anemia of chronic disease   ESRD (end stage renal disease) on dialysis (HCC)   Obesity (BMI 30.0-34.9)   Essential hypertension   Mixed hyperlipidemia   Type 2 diabetes mellitus with hyperglycemia (HCC)   Leukocytosis   Elevated brain natriuretic peptide (BNP) level   Elevated troponin   GERD (gastroesophageal reflux disease)    Body mass index is 34.37 kg/m.  (Obesity)  Acute on chronic diastolic CHF, fluid overload: Hemodialysis is being used for fluid management.  ESRD on hemodialysis: She had hemodialysis yesterday.  Plan for repeat hemodialysis today and tomorrow because she is still above her dry weight bed nephrologist.  Acute hypoxemic respiratory failure: Taper off oxygen as able.  Elevated troponins: This is likely from demand ischemia.  Hypokalemia: We will defer management to nephrologist.  Leukocytosis: This is likely reactive.  WBC is trending down.  Hypertension: Continue antihypertensives  Type II DM: Continue insulin glargine and NovoLog  COPD: Continue bronchodilators  Other comorbidities include arthritis, anxiety, diabetic neuropathy, PVD, history of stroke   Diet Order             Diet renal/carb modified with fluid  restriction Diet-HS Snack? Nothing; Fluid restriction: 1200 mL Fluid; Room service appropriate? Yes; Fluid consistency: Thin  Diet effective now                      Consultants: Nephrologist  Procedures: Hemodialysis    Medications:    aspirin EC  81 mg Oral Daily   atenolol  25 mg Oral Daily   calcium acetate  1,334 mg Oral TID with meals   calcium acetate  667 mg Oral With snacks   Chlorhexidine Gluconate Cloth  6 each Topical Q0600   DULoxetine  20 mg Oral Daily   gabapentin  400 mg Oral QHS   heparin injection (subcutaneous)  5,000 Units Subcutaneous Q8H   hydrOXYzine  10 mg Oral Once   insulin aspart  0-5 Units Subcutaneous QHS   insulin aspart  0-6 Units Subcutaneous TID WC   insulin glargine-yfgn  10 Units Subcutaneous QHS   mometasone-formoterol  2 puff Inhalation BID   montelukast  10 mg Oral QHS   NIFEdipine  60 mg Oral Daily   pantoprazole  40 mg Oral Daily   QUEtiapine  50 mg Oral QHS   rOPINIRole  1 mg Oral QHS   rosuvastatin  5 mg Oral Daily   Continuous Infusions:  sodium chloride     sodium chloride       Anti-infectives (From admission, onward)    None              Family Communication/Anticipated D/C date and plan/Code Status   DVT prophylaxis: heparin injection 5,000 Units Start: 12/06/21 1400  SCDs Start: 12/06/21 0547     Code Status: Full Code  Family Communication: None Disposition Plan: Possible discharge to home tomorrow   Status is: Inpatient  Remains inpatient appropriate because: Plan for hemodialysis today and tomorrow to remove more fluid prior to discharge           Subjective:   She complains of dry cough, chest tightness and shortness of breath rales  Objective:    Vitals:   12/07/21 1350 12/07/21 1400 12/07/21 1430 12/07/21 1445  BP: 126/68 (!) 120/58 132/75 (!) 147/84  Pulse: 80 85 73 86  Resp: 18     Temp: 98.2 F (36.8 C)     TempSrc: Oral     SpO2: 99%     Weight: 88 kg      Height:       No data found.   Intake/Output Summary (Last 24 hours) at 12/07/2021 1455 Last data filed at 12/07/2021 1300 Gross per 24 hour  Intake 480 ml  Output 3440 ml  Net -2960 ml   Filed Weights   12/06/21 1425 12/07/21 0525 12/07/21 1350  Weight: 90.1 kg 89.4 kg 88 kg    Exam:  GEN: NAD SKIN: Warm and dry EYES: EOMI ENT: MMM CV: RRR PULM: Bilateral wheezing and bibasilar  ABD: soft, obese, NT, +BS CNS: AAO x 3, non focal EXT: No edema or tenderness        Data Reviewed:   I have personally reviewed following labs and imaging studies:  Labs: Labs show the following:   Basic Metabolic Panel: Recent Labs  Lab 12/06/21 0137 12/06/21 0438 12/07/21 0622  NA 139  --  133*   136  K 3.6  --  3.3*   3.2*  CL 99  --  94*   96*  CO2 27  --  27   29  GLUCOSE 143*  --  190*   129*  BUN 47*  --  32*   29*  CREATININE 10.38*  --  8.06*   7.49*  CALCIUM 10.1  --  8.4*   8.5*  MG  --  2.5*  --   PHOS  --  5.2* 4.1   GFR Estimated Creatinine Clearance: 7.5 mL/min (A) (by C-G formula based on SCr of 7.49 mg/dL (H)). Liver Function Tests: Recent Labs  Lab 12/07/21 0622  AST 12*  ALT 13  ALKPHOS 62  BILITOT 0.3  PROT 6.2*  ALBUMIN 3.4*   3.2*   No results for input(s): LIPASE, AMYLASE in the last 168 hours. No results for input(s): AMMONIA in the last 168 hours. Coagulation profile No results for input(s): INR, PROTIME in the last 168 hours.  CBC: Recent Labs  Lab 12/06/21 0137 12/07/21 0622 12/07/21 0935  WBC 15.0* 12.8* 12.3*  HGB 11.3* 9.4* 9.4*  HCT 35.8* 29.6* 30.2*  MCV 90.4 88.9 89.1  PLT 272 193 192   Cardiac Enzymes: No results for input(s): CKTOTAL, CKMB, CKMBINDEX, TROPONINI in the last 168 hours. BNP (last 3 results) No results for input(s): PROBNP in the last 8760 hours. CBG: Recent Labs  Lab 12/06/21 0812 12/06/21 1107 12/06/21 2003 12/07/21 0718 12/07/21 1125  GLUCAP 128* 189* 124* 133* 159*   D-Dimer: No results  for input(s): DDIMER in the last 72 hours. Hgb A1c: Recent Labs    12/06/21 0438  HGBA1C 7.3*   Lipid Profile: No results for input(s): CHOL, HDL, LDLCALC, TRIG, CHOLHDL, LDLDIRECT in the last 72 hours. Thyroid function studies: No results  for input(s): TSH, T4TOTAL, T3FREE, THYROIDAB in the last 72 hours.  Invalid input(s): FREET3 Anemia work up: No results for input(s): VITAMINB12, FOLATE, FERRITIN, TIBC, IRON, RETICCTPCT in the last 72 hours. Sepsis Labs: Recent Labs  Lab 12/06/21 0137 12/07/21 0622 12/07/21 0935  WBC 15.0* 12.8* 12.3*    Microbiology Recent Results (from the past 240 hour(s))  Resp Panel by RT-PCR (Flu A&B, Covid) Nasopharyngeal Swab     Status: None   Collection Time: 12/06/21  3:26 AM   Specimen: Nasopharyngeal Swab; Nasopharyngeal(NP) swabs in vial transport medium  Result Value Ref Range Status   SARS Coronavirus 2 by RT PCR NEGATIVE NEGATIVE Final    Comment: (NOTE) SARS-CoV-2 target nucleic acids are NOT DETECTED.  The SARS-CoV-2 RNA is generally detectable in upper respiratory specimens during the acute phase of infection. The lowest concentration of SARS-CoV-2 viral copies this assay can detect is 138 copies/mL. A negative result does not preclude SARS-Cov-2 infection and should not be used as the sole basis for treatment or other patient management decisions. A negative result may occur with  improper specimen collection/handling, submission of specimen other than nasopharyngeal swab, presence of viral mutation(s) within the areas targeted by this assay, and inadequate number of viral copies(<138 copies/mL). A negative result must be combined with clinical observations, patient history, and epidemiological information. The expected result is Negative.  Fact Sheet for Patients:  EntrepreneurPulse.com.au  Fact Sheet for Healthcare Providers:  IncredibleEmployment.be  This test is no t yet approved or  cleared by the [place] FDA and  has been authorized for detection and/or diagnosis of SARS-CoV-2 by FDA under an Emergency Use Authorization (EUA). This EUA will remain  in effect (meaning this test can be used) for the duration of the COVID-19 declaration under Section 564(b)(1) of the Act, 21 U.S.C.section 360bbb-3(b)(1), unless the authorization is terminated  or revoked sooner.       Influenza A by PCR NEGATIVE NEGATIVE Final   Influenza B by PCR NEGATIVE NEGATIVE Final    Comment: (NOTE) The Xpert Xpress SARS-CoV-2/FLU/RSV plus assay is intended as an aid in the diagnosis of influenza from Nasopharyngeal swab specimens and should not be used as a sole basis for treatment. Nasal washings and aspirates are unacceptable for Xpert Xpress SARS-CoV-2/FLU/RSV testing.  Fact Sheet for Patients: EntrepreneurPulse.com.au  Fact Sheet for Healthcare Providers: IncredibleEmployment.be  This test is not yet approved or cleared by the [place] FDA and has been authorized for detection and/or diagnosis of SARS-CoV-2 by FDA under an Emergency Use Authorization (EUA). This EUA will remain in effect (meaning this test can be used) for the duration of the COVID-19 declaration under Section 564(b)(1) of the Act, 21 U.S.C. section 360bbb-3(b)(1), unless the authorization is terminated or revoked.  Performed at [facility], [address], [place], [place] [address]     Procedures and diagnostic studies:  DG Chest [facility] 1 View  Result Date: 12/06/2021 CLINICAL DATA:  Dyspnea EXAM: PORTABLE CHEST 1 VIEW COMPARISON:  02/19/2021 FINDINGS: The lungs are symmetrically well expanded. Stable cardiomegaly. Interval development of mild perihilar and lower lung zone interstitial pulmonary infiltrate possibly representing cardiogenic pulmonary edema. No pneumothorax. Possible small bilateral pleural effusions. No acute bone abnormality. IMPRESSION: Stable  cardiomegaly. Suspected mild superimposed cardiogenic failure. Electronically Signed   By: Fidela Salisbury M.D.   On: 12/06/2021 03:05   ECHOCARDIOGRAM LIMITED  Result Date: 12/06/2021    ECHOCARDIOGRAM LIMITED REPORT   Patient Name:   [name] Date of Exam:  12/06/2021 Medical Rec #:  [id number]       Height:       63.0 in Accession #:    [id number]      Weight:       185.0 lb Date of Birth:  September 23, 1951       BSA:          1.871 m Patient Age:    66 years        BP:           171/79 mmHg Patient Gender: F               HR:           85 bpm. Exam Location:  [facility] Procedure: Limited Echo Indications:    CHF  History:        Patient has prior history of Echocardiogram examinations, most                 recent 09/16/2021. CHF, CAD, COPD and Stroke, Arrythmias:Atrial                 Fibrillation; Risk Factors:Hypertension, Diabetes and                 Dyslipidemia. End stage Renal disease on Dialysis.  Sonographer:    Wenda Low Referring Phys: [id number] OLADAPO ADEFESO IMPRESSIONS  1. Left ventricular ejection fraction, by estimation, is 55 to 60%. The left ventricle has normal function. The left ventricle has no regional wall motion abnormalities. There is mild left ventricular hypertrophy. Left ventricular diastolic parameters are indeterminate.  2. Right ventricular systolic function is normal. The right ventricular size is normal.  3. The aortic valve is tricuspid. There is mild calcification of the aortic valve. There is mild thickening of the aortic valve.  4. The inferior vena cava is dilated in size with >50% respiratory variability, suggesting right atrial pressure of 8 mmHg.  5. Limited echo to evaluate LV function FINDINGS  Left Ventricle: Left ventricular ejection fraction, by estimation, is 55 to 60%. The left ventricle has normal function. The left ventricle has no regional wall motion abnormalities. The left ventricular internal cavity size was normal in size. There is  mild left  ventricular hypertrophy. Left ventricular diastolic parameters are indeterminate. Right Ventricle: The right ventricular size is normal. Right vetricular wall thickness was not well visualized. Right ventricular systolic function is normal. Pericardium: There is no evidence of pericardial effusion. Mitral Valve: There is mild thickening of the mitral valve leaflet(s). There is mild calcification of the mitral valve leaflet(s). Mild mitral annular calcification. Aortic Valve: The aortic valve is tricuspid. There is mild calcification of the aortic valve. There is mild thickening of the aortic valve. There is mild aortic valve annular calcification. Aorta: The aortic root is normal in size and structure. Venous: The inferior vena cava is dilated in size with greater than 50% respiratory variability, suggesting right atrial pressure of 8 mmHg. LEFT VENTRICLE PLAX 2D LVIDd:         5.00 cm LVIDs:         3.10 cm LV PW:         1.00 cm LV IVS:        1.20 cm LVOT diam:     1.90 cm LVOT Area:     2.84 cm  LV Volumes (MOD) LV vol d, MOD A2C: 61.8 ml LV vol d, MOD A4C: 52.3 ml LV vol s, MOD A2C: 29.3 ml LV vol s, MOD  A4C: 25.4 ml LV SV MOD A2C:     32.4 ml LV SV MOD A4C:     52.3 ml LV SV MOD BP:      29.5 ml RIGHT VENTRICLE RV Basal diam:  3.50 cm RV Mid diam:    2.70 cm TAPSE (M-mode): 1.9 cm LEFT ATRIUM         Index LA diam:    5.40 cm 2.89 cm/m   AORTA Ao Root diam: 2.60 cm  SHUNTS Systemic Diam: 1.90 cm Carlyle Dolly MD Electronically signed by Carlyle Dolly MD Signature Date/Time: 12/06/2021/12:07:31 PM    Final                LOS: 1 day   Boyde Grieco  [facility]   Pager on [email or web address]. If 7PM-7AM, please contact night-coverage at [email or web address]     12/07/2021, 2:55 PM

## 2021-12-07 NOTE — Procedures (Signed)
° °  HEMODIALYSIS TREATMENT NOTE:   Uneventful 3 hour low-heparin session completed using right upper arm AVF (16g/antegrade). Goal met: 2.1 liters removed without interruption in UF.  All blood was returned. Weaned to RA and saturating 95%.  Post standing weight 85.5 kg.  No changes from pre-HD assessment.    Rockwell Alexandria, RN

## 2021-12-07 NOTE — Plan of Care (Signed)
°  Problem: Education: Goal: Knowledge of General Education information will improve Description: Including pain rating scale, medication(s)/side effects and non-pharmacologic comfort measures Outcome: Progressing   Problem: Health Behavior/Discharge Planning: Goal: Ability to manage health-related needs will improve Outcome: Progressing   Problem: Clinical Measurements: Goal: Ability to maintain clinical measurements within normal limits will improve Outcome: Progressing Goal: Diagnostic test results will improve Outcome: Progressing Goal: Respiratory complications will improve Outcome: Progressing   Problem: Coping: Goal: Level of anxiety will decrease Outcome: Progressing   Problem: Safety: Goal: Ability to remain free from injury will improve Outcome: Progressing

## 2021-12-07 NOTE — Progress Notes (Signed)
Patient ID: [name], female   DOB: 12/12/1950, 71 y.o.   MRN: [id number] S: Feeling better but still with some chest tightness O:BP 127/62 (BP Location: Left Arm)    Pulse 90    Temp 98.1 F (36.7 C)    Resp 18    Ht 5\' 3"  (1.6 m)    Wt 89.4 kg    SpO2 100%    BMI 34.91 kg/m   Intake/Output Summary (Last 24 hours) at 12/07/2021 0852 Last data filed at 12/06/2021 1835 Gross per 24 hour  Intake 240 ml  Output 3440 ml  Net -3200 ml   Intake/Output: I/O last 3 completed shifts: In: 240 [P.O.:240] Out: 3086 [Other:3440]  Intake/Output this shift:  No intake/output data recorded. Weight change: 6.185 kg Gen:NAD CVS: RRR Resp:scattered end-expiratory wheezes bilaterally Abd:+BS, obese, soft, NT Ext:no edema, RUE AVF +T/B  Recent Labs  Lab 12/06/21 0137 12/06/21 0438 12/07/21 0622  NA 139  --  136  K 3.6  --  3.2*  CL 99  --  96*  CO2 27  --  29  GLUCOSE 143*  --  129*  BUN 47*  --  29*  CREATININE 10.38*  --  7.49*  ALBUMIN  --   --  3.2*  CALCIUM 10.1  --  8.5*  PHOS  --  5.2*  --   AST  --   --  12*  ALT  --   --  13   Liver Function Tests: Recent Labs  Lab 12/07/21 0622  AST 12*  ALT 13  ALKPHOS 62  BILITOT 0.3  PROT 6.2*  ALBUMIN 3.2*   No results for input(s): LIPASE, AMYLASE in the last 168 hours. No results for input(s): AMMONIA in the last 168 hours. CBC: Recent Labs  Lab 12/06/21 0137 12/07/21 0622  WBC 15.0* 12.8*  HGB 11.3* 9.4*  HCT 35.8* 29.6*  MCV 90.4 88.9  PLT 272 193   Cardiac Enzymes: No results for input(s): CKTOTAL, CKMB, CKMBINDEX, TROPONINI in the last 168 hours. CBG: Recent Labs  Lab 12/06/21 0812 12/06/21 1107 12/06/21 2003 12/07/21 0718  GLUCAP 128* 189* 124* 133*    Iron Studies: No results for input(s): IRON, TIBC, TRANSFERRIN, FERRITIN in the last 72 hours. Studies/Results: DG Chest Port 1 View  Result Date: 12/06/2021 CLINICAL DATA:  Dyspnea EXAM: PORTABLE CHEST 1 VIEW COMPARISON:  02/19/2021 FINDINGS: The  lungs are symmetrically well expanded. Stable cardiomegaly. Interval development of mild perihilar and lower lung zone interstitial pulmonary infiltrate possibly representing cardiogenic pulmonary edema. No pneumothorax. Possible small bilateral pleural effusions. No acute bone abnormality. IMPRESSION: Stable cardiomegaly. Suspected mild superimposed cardiogenic failure. Electronically Signed   By: Fidela Salisbury M.D.   On: 12/06/2021 03:05   ECHOCARDIOGRAM LIMITED  Result Date: 12/06/2021    ECHOCARDIOGRAM LIMITED REPORT   Patient Name:   PEGAH SEGEL Date of Exam: 12/06/2021 Medical Rec #:  [id number]       Height:       63.0 in Accession #:    [id number]      Weight:       185.0 lb Date of Birth:  03-05-1951       BSA:          1.871 m Patient Age:    77 years        BP:           171/79 mmHg Patient Gender: F  HR:           85 bpm. Exam Location:  [facility] Procedure: Limited Echo Indications:    CHF  History:        Patient has prior history of Echocardiogram examinations, most                 recent 09/16/2021. CHF, CAD, COPD and Stroke, Arrythmias:Atrial                 Fibrillation; Risk Factors:Hypertension, Diabetes and                 Dyslipidemia. End stage Renal disease on Dialysis.  Sonographer:    Wenda Low Referring Phys: [id number] OLADAPO ADEFESO IMPRESSIONS  1. Left ventricular ejection fraction, by estimation, is 55 to 60%. The left ventricle has normal function. The left ventricle has no regional wall motion abnormalities. There is mild left ventricular hypertrophy. Left ventricular diastolic parameters are indeterminate.  2. Right ventricular systolic function is normal. The right ventricular size is normal.  3. The aortic valve is tricuspid. There is mild calcification of the aortic valve. There is mild thickening of the aortic valve.  4. The inferior vena cava is dilated in size with >50% respiratory variability, suggesting right atrial pressure of 8 mmHg.  5. Limited  echo to evaluate LV function FINDINGS  Left Ventricle: Left ventricular ejection fraction, by estimation, is 55 to 60%. The left ventricle has normal function. The left ventricle has no regional wall motion abnormalities. The left ventricular internal cavity size was normal in size. There is  mild left ventricular hypertrophy. Left ventricular diastolic parameters are indeterminate. Right Ventricle: The right ventricular size is normal. Right vetricular wall thickness was not well visualized. Right ventricular systolic function is normal. Pericardium: There is no evidence of pericardial effusion. Mitral Valve: There is mild thickening of the mitral valve leaflet(s). There is mild calcification of the mitral valve leaflet(s). Mild mitral annular calcification. Aortic Valve: The aortic valve is tricuspid. There is mild calcification of the aortic valve. There is mild thickening of the aortic valve. There is mild aortic valve annular calcification. Aorta: The aortic root is normal in size and structure. Venous: The inferior vena cava is dilated in size with greater than 50% respiratory variability, suggesting right atrial pressure of 8 mmHg. LEFT VENTRICLE PLAX 2D LVIDd:         5.00 cm LVIDs:         3.10 cm LV PW:         1.00 cm LV IVS:        1.20 cm LVOT diam:     1.90 cm LVOT Area:     2.84 cm  LV Volumes (MOD) LV vol d, MOD A2C: 61.8 ml LV vol d, MOD A4C: 52.3 ml LV vol s, MOD A2C: 29.3 ml LV vol s, MOD A4C: 25.4 ml LV SV MOD A2C:     32.4 ml LV SV MOD A4C:     52.3 ml LV SV MOD BP:      29.5 ml RIGHT VENTRICLE RV Basal diam:  3.50 cm RV Mid diam:    2.70 cm TAPSE (M-mode): 1.9 cm LEFT ATRIUM         Index LA diam:    5.40 cm 2.89 cm/m   AORTA Ao Root diam: 2.60 cm  SHUNTS Systemic Diam: 1.90 cm Carlyle Dolly MD Electronically signed by Carlyle Dolly MD Signature Date/Time: 12/06/2021/12:07:31 PM    Final  aspirin EC  81 mg Oral Daily   atenolol  25 mg Oral Daily   calcium acetate  1,334 mg Oral TID  with meals   calcium acetate  667 mg Oral With snacks   Chlorhexidine Gluconate Cloth  6 each Topical Q0600   DULoxetine  20 mg Oral Daily   gabapentin  400 mg Oral QHS   heparin injection (subcutaneous)  5,000 Units Subcutaneous Q8H   hydrOXYzine  10 mg Oral Once   insulin aspart  0-5 Units Subcutaneous QHS   insulin aspart  0-6 Units Subcutaneous TID WC   insulin glargine-yfgn  10 Units Subcutaneous QHS   mometasone-formoterol  2 puff Inhalation BID   montelukast  10 mg Oral QHS   NIFEdipine  60 mg Oral Daily   pantoprazole  40 mg Oral Daily   QUEtiapine  50 mg Oral QHS   rOPINIRole  1 mg Oral QHS   rosuvastatin  5 mg Oral Daily    BMET    Component Value Date/Time   NA 136 12/07/2021 0622   NA 138 01/31/2021 1001   K 3.2 (L) 12/07/2021 0622   CL 96 (L) 12/07/2021 0622   CO2 29 12/07/2021 0622   GLUCOSE 129 (H) 12/07/2021 0622   BUN 29 (H) 12/07/2021 0622   BUN 69 (H) 01/31/2021 1001   CREATININE 7.49 (H) 12/07/2021 0622   CREATININE 1.66 (H) 03/25/2013 1026   CALCIUM 8.5 (L) 12/07/2021 0622   GFRNONAA 5 (L) 12/07/2021 0622   GFRAA 4 (L) 11/01/2020 1022   CBC    Component Value Date/Time   WBC 12.8 (H) 12/07/2021 0622   RBC 3.33 (L) 12/07/2021 0622   HGB 9.4 (L) 12/07/2021 0622   HGB 10.1 (L) 01/31/2021 1001   HCT 29.6 (L) 12/07/2021 0622   HCT 31.2 (L) 01/31/2021 1001   PLT 193 12/07/2021 0622   PLT 263 01/31/2021 1001   MCV 88.9 12/07/2021 0622   MCV 91 01/31/2021 1001   MCH 28.2 12/07/2021 0622   MCHC 31.8 12/07/2021 0622   RDW 15.9 (H) 12/07/2021 0622   RDW 14.4 01/31/2021 1001   LYMPHSABS 2.4 04/11/2021 1124   LYMPHSABS 2.8 01/31/2021 1001   MONOABS 0.8 04/11/2021 1124   EOSABS 1.2 (H) 04/11/2021 1124   EOSABS 1.2 (H) 01/31/2021 1001   BASOSABS 0.1 04/11/2021 1124   BASOSABS 0.1 01/31/2021 1001    Dialysis Orders: Center: [facility]  on TTS . EDW 86.5kg HD Bath 1K/2.5Ca  Time 4 hours Heparin 1000 units IV bolus then 1000 unts/hr. Access RUE AVF  BFR 350 DFR 500    Calcitriol 2.25 mcg po/HD  Micera 60 mcg IV every 2 weeks    Assessment/Plan:  Acute hypoxic respiratory failure - presumably due to volume overload/acute on chronic diastolic CHF as well as COPD exacerbation.  Likely due to dietary indiscretion.  Tolerated HD yesterday with UF of 3.4 liters but still above edw and SOB. Will plan for another short session of HD today and UF as tolerated.  ESRD -  will continue with HD on TTS schedule for now.  Hypertension/volume  - as above will plan to UF as tolerated.   Still above edw and with SOB.  Will plan for serial dialysis today and tomorrow.  Anemia  - stable  Metabolic bone disease -   continue with outpatient meds  Nutrition - renal diet, carb modified, low sodium.    Donetta Potts, MD [employer] [phone]

## 2021-12-07 NOTE — Progress Notes (Signed)
Pleasant patient, vitals stable. She reported feeling a bit better today, now in dialysis.

## 2021-12-08 ENCOUNTER — Ambulatory Visit (INDEPENDENT_AMBULATORY_CARE_PROVIDER_SITE_OTHER): Payer: Medicare Other | Admitting: Pharmacist

## 2021-12-08 VITALS — BP 129/75

## 2021-12-08 DIAGNOSIS — Z992 Dependence on renal dialysis: Secondary | ICD-10-CM | POA: Diagnosis not present

## 2021-12-08 DIAGNOSIS — E782 Mixed hyperlipidemia: Secondary | ICD-10-CM

## 2021-12-08 DIAGNOSIS — E1122 Type 2 diabetes mellitus with diabetic chronic kidney disease: Secondary | ICD-10-CM

## 2021-12-08 DIAGNOSIS — I5031 Acute diastolic (congestive) heart failure: Secondary | ICD-10-CM | POA: Diagnosis not present

## 2021-12-08 DIAGNOSIS — N186 End stage renal disease: Secondary | ICD-10-CM | POA: Diagnosis not present

## 2021-12-08 DIAGNOSIS — E1165 Type 2 diabetes mellitus with hyperglycemia: Secondary | ICD-10-CM

## 2021-12-08 LAB — CBC WITH DIFFERENTIAL/PLATELET
Abs Immature Granulocytes: 0.04 10*3/uL (ref 0.00–0.07)
Basophils Absolute: 0.1 10*3/uL (ref 0.0–0.1)
Basophils Relative: 1 %
Eosinophils Absolute: 1.6 10*3/uL — ABNORMAL HIGH (ref 0.0–0.5)
Eosinophils Relative: 15 %
HCT: 30.3 % — ABNORMAL LOW (ref 36.0–46.0)
Hemoglobin: 9.3 g/dL — ABNORMAL LOW (ref 12.0–15.0)
Immature Granulocytes: 0 %
Lymphocytes Relative: 18 %
Lymphs Abs: 1.9 10*3/uL (ref 0.7–4.0)
MCH: 27.3 pg (ref 26.0–34.0)
MCHC: 30.7 g/dL (ref 30.0–36.0)
MCV: 88.9 fL (ref 80.0–100.0)
Monocytes Absolute: 1.1 10*3/uL — ABNORMAL HIGH (ref 0.1–1.0)
Monocytes Relative: 10 %
Neutro Abs: 6 10*3/uL (ref 1.7–7.7)
Neutrophils Relative %: 56 %
Platelets: 180 10*3/uL (ref 150–400)
RBC: 3.41 MIL/uL — ABNORMAL LOW (ref 3.87–5.11)
RDW: 15.5 % (ref 11.5–15.5)
WBC: 10.7 10*3/uL — ABNORMAL HIGH (ref 4.0–10.5)
nRBC: 0 % (ref 0.0–0.2)

## 2021-12-08 LAB — GLUCOSE, CAPILLARY
Glucose-Capillary: 122 mg/dL — ABNORMAL HIGH (ref 70–99)
Glucose-Capillary: 192 mg/dL — ABNORMAL HIGH (ref 70–99)
Glucose-Capillary: 276 mg/dL — ABNORMAL HIGH (ref 70–99)

## 2021-12-08 LAB — BASIC METABOLIC PANEL
Anion gap: 10 (ref 5–15)
BUN: 26 mg/dL — ABNORMAL HIGH (ref 8–23)
CO2: 28 mmol/L (ref 22–32)
Calcium: 8.9 mg/dL (ref 8.9–10.3)
Chloride: 95 mmol/L — ABNORMAL LOW (ref 98–111)
Creatinine, Ser: 6.35 mg/dL — ABNORMAL HIGH (ref 0.44–1.00)
GFR, Estimated: 7 mL/min — ABNORMAL LOW (ref >60)
Glucose, Bld: 153 mg/dL — ABNORMAL HIGH (ref 70–99)
Potassium: 3.6 mmol/L (ref 3.5–5.1)
Sodium: 133 mmol/L — ABNORMAL LOW (ref 135–145)

## 2021-12-08 MED ORDER — MELATONIN 3 MG PO TABS
6.0000 mg | ORAL_TABLET | Freq: Every day | ORAL | Status: DC
  Administered 2021-12-08 – 2021-12-09 (×2): 6 mg via ORAL
  Filled 2021-12-08 (×2): qty 2

## 2021-12-08 MED ORDER — METHYLPREDNISOLONE SODIUM SUCC 125 MG IJ SOLR
80.0000 mg | Freq: Three times a day (TID) | INTRAMUSCULAR | Status: DC
  Administered 2021-12-08 – 2021-12-09 (×4): 80 mg via INTRAVENOUS
  Filled 2021-12-08 (×4): qty 2

## 2021-12-08 NOTE — Procedures (Signed)
° °  HEMODIALYSIS TREATMENT NOTE:    Uneventful 3.5 hour low-heparin treatment completed using right upper arm AVF (15g/antegrade). Goal met: 2.1 liters removed.  Standing post weight 83.8 kg and BP 153/70.  Pt still is c/o "tightness" in her chest.  RT Vanessa spent 20 minutes explaining proper home use of inhalers.  Pt is requesting a prednisone taper--Dr. Mal Misty was notified.   Rockwell Alexandria, RN

## 2021-12-08 NOTE — Progress Notes (Signed)
Progress Note    [name]  OZH:[id number] DOB: 13-May-1951  DOA: 12/06/2021 PCP: [name], MD      Brief Narrative:    Medical records reviewed and are as summarized below:  [name] is a 71 y.o. female  with medical history significant for ESRD HD (TThS),, essential hypertension, hyperlipidemia, CAD s/p stent placement, COPD, GERD, who presented to the hospital with shortness of breath, orthopnea, bilateral hand and leg swelling.      Assessment/Plan:   Principal Problem:   Acute CHF (congestive heart failure) (HCC) Active Problems:   COPD (chronic obstructive pulmonary disease) (HCC)   Coronary artery disease involving native coronary artery of native heart without angina pectoris   Anemia of chronic disease   ESRD (end stage renal disease) on dialysis (HCC)   Obesity (BMI 30.0-34.9)   Essential hypertension   Mixed hyperlipidemia   Type 2 diabetes mellitus with hyperglycemia (HCC)   Leukocytosis   Elevated brain natriuretic peptide (BNP) level   Elevated troponin   GERD (gastroesophageal reflux disease)    Body mass index is 33.62 kg/m.  (Obesity)  Acute on chronic diastolic CHF, fluid overload: Hemodialysis is being used for fluid management.  ESRD on hemodialysis: She has had hemodialysis for 3 consecutive days.  Follow-up with nephrologist  COPD exacerbation: Start IV steroids.  Continue bronchodilators.  Acute hypoxemic respiratory failure: Improving.  Elevated troponins: This is likely from demand ischemia.  Hypokalemia: Improved  Leukocytosis: This is likely reactive.  WBC is trending down.  Hypertension: Continue antihypertensives  Type II DM: Continue insulin glargine and NovoLog  Other comorbidities include arthritis, anxiety, diabetic neuropathy, PVD, history of stroke   Diet Order             Diet renal/carb modified with fluid restriction Diet-HS Snack? Nothing; Fluid restriction: 1200 mL Fluid; Room service  appropriate? Yes; Fluid consistency: Thin  Diet effective now                      Consultants: Nephrologist  Procedures: Hemodialysis    Medications:    aspirin EC  81 mg Oral Daily   atenolol  25 mg Oral Daily   calcium acetate  1,334 mg Oral TID with meals   calcium acetate  667 mg Oral With snacks   Chlorhexidine Gluconate Cloth  6 each Topical Q0600   DULoxetine  20 mg Oral Daily   gabapentin  400 mg Oral QHS   heparin injection (subcutaneous)  5,000 Units Subcutaneous Q8H   hydrOXYzine  10 mg Oral Once   insulin aspart  0-5 Units Subcutaneous QHS   insulin aspart  0-6 Units Subcutaneous TID WC   insulin glargine-yfgn  10 Units Subcutaneous QHS   methylPREDNISolone (SOLU-MEDROL) injection  80 mg Intravenous Q8H   mometasone-formoterol  2 puff Inhalation BID   montelukast  10 mg Oral QHS   NIFEdipine  60 mg Oral Daily   pantoprazole  40 mg Oral Daily   QUEtiapine  50 mg Oral QHS   rOPINIRole  1 mg Oral QHS   rosuvastatin  5 mg Oral Daily   Continuous Infusions:  sodium chloride     sodium chloride       Anti-infectives (From admission, onward)    None              Family Communication/Anticipated D/C date and plan/Code Status   DVT prophylaxis: heparin injection 5,000 Units Start: 12/06/21 1400 SCDs Start: 12/06/21 0547  Code Status: Full Code  Family Communication: None Disposition Plan: Possible discharge to home tomorrow   Status is: Inpatient  Remains inpatient appropriate because: COPD exacerbation           Subjective:   She was seen during dialysis at the dialysis unit.  A follow-up visit was done in her room after dialysis.  She c/o chest tightness and wheezing.  Breathing is a little better.  Objective:    Vitals:   12/08/21 1100 12/08/21 1133 12/08/21 1150 12/08/21 1220  BP: (!) 150/79  (!) 153/74 136/67  Pulse: 87  80 65  Resp:   13 18  Temp:   98.1 F (36.7 C)   TempSrc:   Oral   SpO2:  95% 96%  96%  Weight:      Height:       No data found.   Intake/Output Summary (Last 24 hours) at 12/08/2021 1555 Last data filed at 12/08/2021 1300 Gross per 24 hour  Intake 480 ml  Output 4337 ml  Net -3857 ml   Filed Weights   12/07/21 0525 12/07/21 1350 12/08/21 0725  Weight: 89.4 kg 88 kg 86.1 kg    Exam:  GEN: NAD SKIN: No rash EYES: EOMI ENT: MMM CV: RRR PULM: Decreased air entry bilaterally, bilateral expiratory wheezing ABD: soft, obese, NT, +BS CNS: AAO x 3, non focal EXT: No edema or tenderness         Data Reviewed:   I have personally reviewed following labs and imaging studies:  Labs: Labs show the following:   Basic Metabolic Panel: Recent Labs  Lab 12/06/21 0137 12/06/21 0438 12/07/21 0622 12/08/21 0533  NA 139  --  133*   136 133*  K 3.6  --  3.3*   3.2* 3.6  CL 99  --  94*   96* 95*  CO2 27  --  27   29 28   GLUCOSE 143*  --  190*   129* 153*  BUN 47*  --  32*   29* 26*  CREATININE 10.38*  --  8.06*   7.49* 6.35*  CALCIUM 10.1  --  8.4*   8.5* 8.9  MG  --  2.5*  --   --   PHOS  --  5.2* 4.1  --    GFR Estimated Creatinine Clearance: 8.7 mL/min (A) (by C-G formula based on SCr of 6.35 mg/dL (H)). Liver Function Tests: Recent Labs  Lab 12/07/21 0622  AST 12*  ALT 13  ALKPHOS 62  BILITOT 0.3  PROT 6.2*  ALBUMIN 3.4*   3.2*   No results for input(s): LIPASE, AMYLASE in the last 168 hours. No results for input(s): AMMONIA in the last 168 hours. Coagulation profile No results for input(s): INR, PROTIME in the last 168 hours.  CBC: Recent Labs  Lab 12/06/21 0137 12/07/21 0622 12/07/21 0935 12/08/21 0533  WBC 15.0* 12.8* 12.3* 10.7*  NEUTROABS  --   --   --  6.0  HGB 11.3* 9.4* 9.4* 9.3*  HCT 35.8* 29.6* 30.2* 30.3*  MCV 90.4 88.9 89.1 88.9  PLT 272 193 192 180   Cardiac Enzymes: No results for input(s): CKTOTAL, CKMB, CKMBINDEX, TROPONINI in the last 168 hours. BNP (last 3 results) No results for input(s): PROBNP in the  last 8760 hours. CBG: Recent Labs  Lab 12/06/21 2003 12/07/21 0718 12/07/21 1125 12/07/21 2034 12/08/21 1220  GLUCAP 124* 133* 159* 223* 122*   D-Dimer: No results for input(s): DDIMER in the last  72 hours. Hgb A1c: Recent Labs    12/06/21 0438  HGBA1C 7.3*   Lipid Profile: No results for input(s): CHOL, HDL, LDLCALC, TRIG, CHOLHDL, LDLDIRECT in the last 72 hours. Thyroid function studies: No results for input(s): TSH, T4TOTAL, T3FREE, THYROIDAB in the last 72 hours.  Invalid input(s): FREET3 Anemia work up: No results for input(s): VITAMINB12, FOLATE, FERRITIN, TIBC, IRON, RETICCTPCT in the last 72 hours. Sepsis Labs: Recent Labs  Lab 12/06/21 0137 12/07/21 0622 12/07/21 0935 12/08/21 0533  WBC 15.0* 12.8* 12.3* 10.7*    Microbiology Recent Results (from the past 240 hour(s))  Resp Panel by RT-PCR (Flu A&B, Covid) Nasopharyngeal Swab     Status: None   Collection Time: 12/06/21  3:26 AM   Specimen: Nasopharyngeal Swab; Nasopharyngeal(NP) swabs in vial transport medium  Result Value Ref Range Status   SARS Coronavirus 2 by RT PCR NEGATIVE NEGATIVE Final    Comment: (NOTE) SARS-CoV-2 target nucleic acids are NOT DETECTED.  The SARS-CoV-2 RNA is generally detectable in upper respiratory specimens during the acute phase of infection. The lowest concentration of SARS-CoV-2 viral copies this assay can detect is 138 copies/mL. A negative result does not preclude SARS-Cov-2 infection and should not be used as the sole basis for treatment or other patient management decisions. A negative result may occur with  improper specimen collection/handling, submission of specimen other than nasopharyngeal swab, presence of viral mutation(s) within the areas targeted by this assay, and inadequate number of viral copies(<138 copies/mL). A negative result must be combined with clinical observations, patient history, and epidemiological information. The expected result is  Negative.  Fact Sheet for Patients:  EntrepreneurPulse.com.au  Fact Sheet for Healthcare Providers:  IncredibleEmployment.be  This test is no t yet approved or cleared by the [place] FDA and  has been authorized for detection and/or diagnosis of SARS-CoV-2 by FDA under an Emergency Use Authorization (EUA). This EUA will remain  in effect (meaning this test can be used) for the duration of the COVID-19 declaration under Section 564(b)(1) of the Act, 21 U.S.C.section 360bbb-3(b)(1), unless the authorization is terminated  or revoked sooner.       Influenza A by PCR NEGATIVE NEGATIVE Final   Influenza B by PCR NEGATIVE NEGATIVE Final    Comment: (NOTE) The Xpert Xpress SARS-CoV-2/FLU/RSV plus assay is intended as an aid in the diagnosis of influenza from Nasopharyngeal swab specimens and should not be used as a sole basis for treatment. Nasal washings and aspirates are unacceptable for Xpert Xpress SARS-CoV-2/FLU/RSV testing.  Fact Sheet for Patients: EntrepreneurPulse.com.au  Fact Sheet for Healthcare Providers: IncredibleEmployment.be  This test is not yet approved or cleared by the [place] FDA and has been authorized for detection and/or diagnosis of SARS-CoV-2 by FDA under an Emergency Use Authorization (EUA). This EUA will remain in effect (meaning this test can be used) for the duration of the COVID-19 declaration under Section 564(b)(1) of the Act, 21 U.S.C. section 360bbb-3(b)(1), unless the authorization is terminated or revoked.  Performed at [facility], [address] [place], [place] [address]     Procedures and diagnostic studies:  No results found.             LOS: 2 days   Jenavive Lamboy  [facility]   Pager on [email or web address]. If 7PM-7AM, please contact night-coverage at [email or web address]     12/08/2021, 3:55 PM

## 2021-12-08 NOTE — Procedures (Signed)
I was present at this dialysis session. I have reviewed the session itself and made appropriate changes.   Vital signs in last 24 hours:  Temp:  [98.2 F (36.8 C)-98.4 F (36.9 C)] 98.2 F (36.8 C) (01/05 0516) Pulse Rate:  [63-86] 83 (01/05 0516) Resp:  [16-20] 20 (01/05 0516) BP: (120-147)/(52-94) 131/67 (01/05 0516) SpO2:  [94 %-100 %] 97 % (01/05 0516) Weight:  [88 kg] 88 kg (01/04 1350) Weight change: -2.1 kg Filed Weights   12/06/21 1425 12/07/21 0525 12/07/21 1350  Weight: 90.1 kg 89.4 kg 88 kg    Recent Labs  Lab 12/07/21 0622 12/08/21 0533  NA 133*   136 133*  K 3.3*   3.2* 3.6  CL 94*   96* 95*  CO2 27   29 28   GLUCOSE 190*   129* 153*  BUN 32*   29* 26*  CREATININE 8.06*   7.49* 6.35*  CALCIUM 8.4*   8.5* 8.9  PHOS 4.1  --     Recent Labs  Lab 12/07/21 0622 12/07/21 0935 12/08/21 0533  WBC 12.8* 12.3* 10.7*  NEUTROABS  --   --  6.0  HGB 9.4* 9.4* 9.3*  HCT 29.6* 30.2* 30.3*  MCV 88.9 89.1 88.9  PLT 193 192 180    Scheduled Meds:  aspirin EC  81 mg Oral Daily   atenolol  25 mg Oral Daily   calcium acetate  1,334 mg Oral TID with meals   calcium acetate  667 mg Oral With snacks   Chlorhexidine Gluconate Cloth  6 each Topical Q0600   DULoxetine  20 mg Oral Daily   gabapentin  400 mg Oral QHS   heparin injection (subcutaneous)  5,000 Units Subcutaneous Q8H   hydrOXYzine  10 mg Oral Once   insulin aspart  0-5 Units Subcutaneous QHS   insulin aspart  0-6 Units Subcutaneous TID WC   insulin glargine-yfgn  10 Units Subcutaneous QHS   mometasone-formoterol  2 puff Inhalation BID   montelukast  10 mg Oral QHS   NIFEdipine  60 mg Oral Daily   pantoprazole  40 mg Oral Daily   QUEtiapine  50 mg Oral QHS   rOPINIRole  1 mg Oral QHS   rosuvastatin  5 mg Oral Daily   Continuous Infusions:  sodium chloride     sodium chloride     PRN Meds:.sodium chloride, sodium chloride, acetaminophen, albuterol, heparin, heparin, heparin, lidocaine (PF),  lidocaine-prilocaine, nitroGLYCERIN, pentafluoroprop-tetrafluoroeth, polyvinyl alcohol   Donetta Potts,  MD 12/08/2021, 8:25 AM

## 2021-12-09 ENCOUNTER — Telehealth: Payer: Self-pay | Admitting: Family Medicine

## 2021-12-09 DIAGNOSIS — N186 End stage renal disease: Secondary | ICD-10-CM | POA: Diagnosis not present

## 2021-12-09 DIAGNOSIS — I5031 Acute diastolic (congestive) heart failure: Secondary | ICD-10-CM | POA: Diagnosis not present

## 2021-12-09 DIAGNOSIS — Z992 Dependence on renal dialysis: Secondary | ICD-10-CM | POA: Diagnosis not present

## 2021-12-09 LAB — GLUCOSE, CAPILLARY
Glucose-Capillary: 313 mg/dL — ABNORMAL HIGH (ref 70–99)
Glucose-Capillary: 414 mg/dL — ABNORMAL HIGH (ref 70–99)
Glucose-Capillary: 376 mg/dL — ABNORMAL HIGH (ref 70–99)
Glucose-Capillary: 367 mg/dL — ABNORMAL HIGH (ref 70–99)
Glucose-Capillary: 364 mg/dL — ABNORMAL HIGH (ref 70–99)
Glucose-Capillary: 335 mg/dL — ABNORMAL HIGH (ref 70–99)
Glucose-Capillary: 339 mg/dL — ABNORMAL HIGH (ref 70–99)
Glucose-Capillary: 320 mg/dL — ABNORMAL HIGH (ref 70–99)

## 2021-12-09 MED ORDER — PREDNISONE 20 MG PO TABS: 40.0000 mg | ORAL_TABLET | Freq: Every day | ORAL | 0 refills | Status: DC

## 2021-12-09 MED ORDER — INSULIN GLARGINE-YFGN 100 UNIT/ML ~~LOC~~ SOLN
10.0000 [IU] | Freq: Once | SUBCUTANEOUS | Status: AC
  Administered 2021-12-09: 10 [IU] via SUBCUTANEOUS
  Filled 2021-12-09: qty 0.1

## 2021-12-09 MED ORDER — INSULIN ASPART 100 UNIT/ML IJ SOLN
8.0000 [IU] | Freq: Once | INTRAMUSCULAR | Status: AC
  Administered 2021-12-09: 8 [IU] via SUBCUTANEOUS

## 2021-12-09 NOTE — Progress Notes (Signed)
Patient ID: [name], female   DOB: [birth year], 71 y.o.   MRN: [id number] S: Feels much better this morning. O:BP (!) 166/60 (BP Location: Left Arm)    Pulse 79    Temp 97.8 F (36.6 C)    Resp 17    Ht 5\' 3"  (1.6 m)    Wt 83.8 kg    SpO2 96%    BMI 32.73 kg/m   Intake/Output Summary (Last 24 hours) at 12/09/2021 0926 Last data filed at 12/08/2021 1700 Gross per 24 hour  Intake 480 ml  Output 2152 ml  Net -1672 ml   Intake/Output: I/O last 3 completed shifts: In: 480 [P.O.:480] Out: 2152 [Other:2152]  Intake/Output this shift:  No intake/output data recorded. Weight change: -1.9 kg Gen:NAD CVS: RRR Resp:CTA Abd: +BS, soft, NT/ND Ext: no edema, RUE AVF +T/B  Recent Labs  Lab 12/06/21 0137 12/06/21 0438 12/07/21 0622 12/08/21 0533  NA 139  --  133*   136 133*  K 3.6  --  3.3*   3.2* 3.6  CL 99  --  94*   96* 95*  CO2 27  --  27   29 28   GLUCOSE 143*  --  190*   129* 153*  BUN 47*  --  32*   29* 26*  CREATININE 10.38*  --  8.06*   7.49* 6.35*  ALBUMIN  --   --  3.4*   3.2*  --   CALCIUM 10.1  --  8.4*   8.5* 8.9  PHOS  --  5.2* 4.1  --   AST  --   --  12*  --   ALT  --   --  13  --    Liver Function Tests: Recent Labs  Lab 12/07/21 0622  AST 12*  ALT 13  ALKPHOS 62  BILITOT 0.3  PROT 6.2*  ALBUMIN 3.4*   3.2*   No results for input(s): LIPASE, AMYLASE in the last 168 hours. No results for input(s): AMMONIA in the last 168 hours. CBC: Recent Labs  Lab 12/06/21 0137 12/07/21 0622 12/07/21 0935 12/08/21 0533  WBC 15.0* 12.8* 12.3* 10.7*  NEUTROABS  --   --   --  6.0  HGB 11.3* 9.4* 9.4* 9.3*  HCT 35.8* 29.6* 30.2* 30.3*  MCV 90.4 88.9 89.1 88.9  PLT 272 193 192 180   Cardiac Enzymes: No results for input(s): CKTOTAL, CKMB, CKMBINDEX, TROPONINI in the last 168 hours. CBG: Recent Labs  Lab 12/07/21 2034 12/08/21 1220 12/08/21 1604 12/08/21 2142 12/09/21 0728  GLUCAP 223* 122* 192* 276* 313*    Iron Studies: No results for input(s): IRON,  TIBC, TRANSFERRIN, FERRITIN in the last 72 hours. Studies/Results: No results found.  aspirin EC  81 mg Oral Daily   atenolol  25 mg Oral Daily   calcium acetate  1,334 mg Oral TID with meals   calcium acetate  667 mg Oral With snacks   Chlorhexidine Gluconate Cloth  6 each Topical Q0600   DULoxetine  20 mg Oral Daily   gabapentin  400 mg Oral QHS   heparin injection (subcutaneous)  5,000 Units Subcutaneous Q8H   hydrOXYzine  10 mg Oral Once   insulin aspart  0-5 Units Subcutaneous QHS   insulin aspart  0-6 Units Subcutaneous TID WC   insulin glargine-yfgn  10 Units Subcutaneous QHS   insulin glargine-yfgn  10 Units Subcutaneous Once   melatonin  6 mg Oral QHS   methylPREDNISolone (SOLU-MEDROL) injection  80  mg Intravenous Q8H   mometasone-formoterol  2 puff Inhalation BID   montelukast  10 mg Oral QHS   NIFEdipine  60 mg Oral Daily   pantoprazole  40 mg Oral Daily   QUEtiapine  50 mg Oral QHS   rOPINIRole  1 mg Oral QHS   rosuvastatin  5 mg Oral Daily    BMET    Component Value Date/Time   NA 133 (L) 12/08/2021 0533   NA 138 01/31/2021 1001   K 3.6 12/08/2021 0533   CL 95 (L) 12/08/2021 0533   CO2 28 12/08/2021 0533   GLUCOSE 153 (H) 12/08/2021 0533   BUN 26 (H) 12/08/2021 0533   BUN 69 (H) 01/31/2021 1001   CREATININE 6.35 (H) 12/08/2021 0533   CREATININE 1.66 (H) 03/25/2013 1026   CALCIUM 8.9 12/08/2021 0533   GFRNONAA 7 (L) 12/08/2021 0533   GFRAA 4 (L) 11/01/2020 1022   CBC    Component Value Date/Time   WBC 10.7 (H) 12/08/2021 0533   RBC 3.41 (L) 12/08/2021 0533   HGB 9.3 (L) 12/08/2021 0533   HGB 10.1 (L) 01/31/2021 1001   HCT 30.3 (L) 12/08/2021 0533   HCT 31.2 (L) 01/31/2021 1001   PLT 180 12/08/2021 0533   PLT 263 01/31/2021 1001   MCV 88.9 12/08/2021 0533   MCV 91 01/31/2021 1001   MCH 27.3 12/08/2021 0533   MCHC 30.7 12/08/2021 0533   RDW 15.5 12/08/2021 0533   RDW 14.4 01/31/2021 1001   LYMPHSABS 1.9 12/08/2021 0533   LYMPHSABS 2.8 01/31/2021  1001   MONOABS 1.1 (H) 12/08/2021 0533   EOSABS 1.6 (H) 12/08/2021 0533   EOSABS 1.2 (H) 01/31/2021 1001   BASOSABS 0.1 12/08/2021 0533   BASOSABS 0.1 01/31/2021 1001    Dialysis Orders: Center: [facility]  on TTS . EDW 86.5kg HD Bath 1K/2.5Ca  Time 4 hours Heparin 1000 units IV bolus then 1000 unts/hr. Access RUE AVF BFR 350 DFR 500    Calcitriol 2.25 mcg po/HD  Micera 60 mcg IV every 2 weeks    Assessment/Plan:  Acute hypoxic respiratory failure - presumably due to volume overload/acute on chronic diastolic CHF as well as COPD exacerbation.  Likely due to dietary indiscretion.  Tolerated HD yesterday with UF of 3.4 liters but still above edw and SOB. Markedly improved with serial HD sessions. New EDW is 84 kg Off of oxygen and stable for discharge from renal standpoint.  ESRD -  will continue with HD on TTS schedule for now and ok for outpatient HD tomorrow.  Hypertension/volume  - as above will plan to UF as tolerated.  new EDW is 84 kg  Anemia  - stable  Metabolic bone disease -   continue with outpatient meds  Nutrition - renal diet, carb modified, low sodium.  Donetta Potts, MD [employer] [phone]

## 2021-12-09 NOTE — Progress Notes (Signed)
Inpatient Diabetes Program Recommendations  AACE/ADA: New Consensus Statement on Inpatient Glycemic Control   Target Ranges:  Prepandial:   less than 140 mg/dL      Peak postprandial:   less than 180 mg/dL (1-2 hours)      Critically ill patients:  140 - 180 mg/dL    Latest Reference Range & Units 12/08/21 12:20 12/08/21 16:04 12/08/21 21:42 12/09/21 07:28  Glucose-Capillary 70 - 99 mg/dL 122 (H) 192 (H) 276 (H) 313 (H)   Review of Glycemic Control  Diabetes history: DM2 Outpatient Diabetes medications: Basaglar 25 units QHS, Ozempic 0.25 mg Qweek (Friday) Current orders for Inpatient glycemic control: Semglee 10 units QHS, NOvolog 0-6 units TID with meals, Novolog 0-5 units QHS, Semglee 10 units x1 now; Solumedrol 80 mg Q8H  Inpatient Diabetes Program Recommendations:    Insulin: If steroids are continued, please consider increasing Semglee to 10 units BID and ordering Novolog 3 units TID with meals for meal coverage if patient eats at least 50% of meals.  Thanks, Barnie Alderman, RN, MSN, CDE Diabetes Coordinator Inpatient Diabetes Program [phone] (Team Pager from 8am to 5pm)

## 2021-12-09 NOTE — Telephone Encounter (Signed)
Call placed to patient's daughter wendy Patient is doing okay Discussed libre with daughter including alarms

## 2021-12-09 NOTE — Care Management Important Message (Signed)
Important Message  Patient Details  Name: [name] MRN: [id number] Date of Birth: [birth year]   Medicare Important Message Given:  Yes     [name] 12/09/2021, 4:39 PM

## 2021-12-09 NOTE — Progress Notes (Signed)
Progress Note    [name]  UKG:[id number] DOB: 25-Apr-1951  DOA: 12/06/2021 PCP: [name], MD      Brief Narrative:    Medical records reviewed and are as summarized below:  [name] is a 71 y.o. female  with medical history significant for ESRD HD (TThS),, essential hypertension, hyperlipidemia, CAD s/p stent placement, COPD, GERD, who presented to the hospital with shortness of breath, orthopnea, bilateral hand and leg swelling.      Assessment/Plan:   Principal Problem:   Acute CHF (congestive heart failure) (HCC) Active Problems:   COPD (chronic obstructive pulmonary disease) (HCC)   Coronary artery disease involving native coronary artery of native heart without angina pectoris   Anemia of chronic disease   ESRD (end stage renal disease) on dialysis (HCC)   Obesity (BMI 30.0-34.9)   Essential hypertension   Mixed hyperlipidemia   Type 2 diabetes mellitus with hyperglycemia (HCC)   Leukocytosis   Elevated brain natriuretic peptide (BNP) level   Elevated troponin   GERD (gastroesophageal reflux disease)    Body mass index is 32.73 kg/m.  (Obesity)  Acute on chronic diastolic CHF, fluid overload: Hemodialysis is being used for fluid management.  ESRD on hemodialysis: Plan for hemodialysis tomorrow.  COPD exacerbation: Discontinue IV steroids and start prednisone tomorrow  Elevated troponins: This is likely from demand ischemia.  Hypertension: Continue antihypertensives  Type II DM with hyperglycemia: Glucose levels have been running high today because of steroids.  Dose of insulin glargine was increased because of hyperglycemia.  However, glucose levels were persistently high and patient said she did not feel comfortable going home today because her sugars have never been this high.  Monitored in the hospital overnight and discharged home tomorrow if glucose levels are better.  Acute hypoxemic respiratory failure, hypokalemia,  leukocytosis: Improved   Other comorbidities include arthritis, anxiety, diabetic neuropathy, PVD, history of stroke   Diet Order             Diet - low sodium heart healthy           Diet renal/carb modified with fluid restriction Diet-HS Snack? Nothing; Fluid restriction: 1200 mL Fluid; Room service appropriate? Yes; Fluid consistency: Thin  Diet effective now                      Consultants: Nephrologist  Procedures: Hemodialysis    Medications:    aspirin EC  81 mg Oral Daily   atenolol  25 mg Oral Daily   calcium acetate  1,334 mg Oral TID with meals   calcium acetate  667 mg Oral With snacks   Chlorhexidine Gluconate Cloth  6 each Topical Q0600   DULoxetine  20 mg Oral Daily   gabapentin  400 mg Oral QHS   heparin injection (subcutaneous)  5,000 Units Subcutaneous Q8H   hydrOXYzine  10 mg Oral Once   insulin aspart  0-5 Units Subcutaneous QHS   insulin aspart  0-6 Units Subcutaneous TID WC   insulin glargine-yfgn  10 Units Subcutaneous QHS   melatonin  6 mg Oral QHS   mometasone-formoterol  2 puff Inhalation BID   montelukast  10 mg Oral QHS   NIFEdipine  60 mg Oral Daily   pantoprazole  40 mg Oral Daily   QUEtiapine  50 mg Oral QHS   rOPINIRole  1 mg Oral QHS   rosuvastatin  5 mg Oral Daily   Continuous Infusions:  sodium chloride  sodium chloride       Anti-infectives (From admission, onward)    None              Family Communication/Anticipated D/C date and plan/Code Status   DVT prophylaxis: heparin injection 5,000 Units Start: 12/06/21 1400 SCDs Start: 12/06/21 0547     Code Status: Full Code  Family Communication: None Disposition Plan: Possible discharge to home tomorrow   Status is: Inpatient  Remains inpatient appropriate because: Hyperglycemia           Subjective:   She feels better today.  Chest tightness has improved.  No shortness of breath or chest pain.  However, she is concerned that her  blood sugar levels have been going up  Objective:    Vitals:   12/08/21 2117 12/09/21 0543 12/09/21 0927 12/09/21 1500  BP: (!) 166/71 (!) 166/60  (!) 152/63  Pulse: 89 79  75  Resp: 18 17  18   Temp: 98.2 F (36.8 C) 97.8 F (36.6 C)  98 F (36.7 C)  TempSrc: Oral   Oral  SpO2: 94% 96% 94% 95%  Weight:      Height:       No data found.   Intake/Output Summary (Last 24 hours) at 12/09/2021 1652 Last data filed at 12/09/2021 1500 Gross per 24 hour  Intake 720 ml  Output --  Net 720 ml   Filed Weights   12/07/21 1350 12/08/21 0725 12/08/21 1150  Weight: 88 kg 86.1 kg 83.8 kg    Exam:  GEN: NAD SKIN: Warm and dry EYES: No pallor or icterus ENT: MMM CV: RRR PULM: CTA B ABD: soft, obese, NT, +BS CNS: AAO x 3, non focal EXT: No edema or tenderness          Data Reviewed:   I have personally reviewed following labs and imaging studies:  Labs: Labs show the following:   Basic Metabolic Panel: Recent Labs  Lab 12/06/21 0137 12/06/21 0438 12/07/21 0622 12/08/21 0533  NA 139  --  133*   136 133*  K 3.6  --  3.3*   3.2* 3.6  CL 99  --  94*   96* 95*  CO2 27  --  27   29 28   GLUCOSE 143*  --  190*   129* 153*  BUN 47*  --  32*   29* 26*  CREATININE 10.38*  --  8.06*   7.49* 6.35*  CALCIUM 10.1  --  8.4*   8.5* 8.9  MG  --  2.5*  --   --   PHOS  --  5.2* 4.1  --    GFR Estimated Creatinine Clearance: 8.6 mL/min (A) (by C-G formula based on SCr of 6.35 mg/dL (H)). Liver Function Tests: Recent Labs  Lab 12/07/21 0622  AST 12*  ALT 13  ALKPHOS 62  BILITOT 0.3  PROT 6.2*  ALBUMIN 3.4*   3.2*   No results for input(s): LIPASE, AMYLASE in the last 168 hours. No results for input(s): AMMONIA in the last 168 hours. Coagulation profile No results for input(s): INR, PROTIME in the last 168 hours.  CBC: Recent Labs  Lab 12/06/21 0137 12/07/21 0622 12/07/21 0935 12/08/21 0533  WBC 15.0* 12.8* 12.3* 10.7*  NEUTROABS  --   --   --  6.0  HGB  11.3* 9.4* 9.4* 9.3*  HCT 35.8* 29.6* 30.2* 30.3*  MCV 90.4 88.9 89.1 88.9  PLT 272 193 192 180   Cardiac Enzymes: No results  for input(s): CKTOTAL, CKMB, CKMBINDEX, TROPONINI in the last 168 hours. BNP (last 3 results) No results for input(s): PROBNP in the last 8760 hours. CBG: Recent Labs  Lab 12/09/21 0728 12/09/21 1126 12/09/21 1259 12/09/21 1445 12/09/21 1622  GLUCAP 313* 414* 376* 367* 364*   D-Dimer: No results for input(s): DDIMER in the last 72 hours. Hgb A1c: No results for input(s): HGBA1C in the last 72 hours.  Lipid Profile: No results for input(s): CHOL, HDL, LDLCALC, TRIG, CHOLHDL, LDLDIRECT in the last 72 hours. Thyroid function studies: No results for input(s): TSH, T4TOTAL, T3FREE, THYROIDAB in the last 72 hours.  Invalid input(s): FREET3 Anemia work up: No results for input(s): VITAMINB12, FOLATE, FERRITIN, TIBC, IRON, RETICCTPCT in the last 72 hours. Sepsis Labs: Recent Labs  Lab 12/06/21 0137 12/07/21 0622 12/07/21 0935 12/08/21 0533  WBC 15.0* 12.8* 12.3* 10.7*    Microbiology Recent Results (from the past 240 hour(s))  Resp Panel by RT-PCR (Flu A&B, Covid) Nasopharyngeal Swab     Status: None   Collection Time: 12/06/21  3:26 AM   Specimen: Nasopharyngeal Swab; Nasopharyngeal(NP) swabs in vial transport medium  Result Value Ref Range Status   SARS Coronavirus 2 by RT PCR NEGATIVE NEGATIVE Final    Comment: (NOTE) SARS-CoV-2 target nucleic acids are NOT DETECTED.  The SARS-CoV-2 RNA is generally detectable in upper respiratory specimens during the acute phase of infection. The lowest concentration of SARS-CoV-2 viral copies this assay can detect is 138 copies/mL. A negative result does not preclude SARS-Cov-2 infection and should not be used as the sole basis for treatment or other patient management decisions. A negative result may occur with  improper specimen collection/handling, submission of specimen other than nasopharyngeal  swab, presence of viral mutation(s) within the areas targeted by this assay, and inadequate number of viral copies(<138 copies/mL). A negative result must be combined with clinical observations, patient history, and epidemiological information. The expected result is Negative.  Fact Sheet for Patients:  EntrepreneurPulse.com.au  Fact Sheet for Healthcare Providers:  IncredibleEmployment.be  This test is no t yet approved or cleared by the [place] FDA and  has been authorized for detection and/or diagnosis of SARS-CoV-2 by FDA under an Emergency Use Authorization (EUA). This EUA will remain  in effect (meaning this test can be used) for the duration of the COVID-19 declaration under Section 564(b)(1) of the Act, 21 U.S.C.section 360bbb-3(b)(1), unless the authorization is terminated  or revoked sooner.       Influenza A by PCR NEGATIVE NEGATIVE Final   Influenza B by PCR NEGATIVE NEGATIVE Final    Comment: (NOTE) The Xpert Xpress SARS-CoV-2/FLU/RSV plus assay is intended as an aid in the diagnosis of influenza from Nasopharyngeal swab specimens and should not be used as a sole basis for treatment. Nasal washings and aspirates are unacceptable for Xpert Xpress SARS-CoV-2/FLU/RSV testing.  Fact Sheet for Patients: EntrepreneurPulse.com.au  Fact Sheet for Healthcare Providers: IncredibleEmployment.be  This test is not yet approved or cleared by the [place] FDA and has been authorized for detection and/or diagnosis of SARS-CoV-2 by FDA under an Emergency Use Authorization (EUA). This EUA will remain in effect (meaning this test can be used) for the duration of the COVID-19 declaration under Section 564(b)(1) of the Act, 21 U.S.C. section 360bbb-3(b)(1), unless the authorization is terminated or revoked.  Performed at [facility], [address], [place], [place] [address]     Procedures and  diagnostic studies:  No results found.  LOS: 3 days   Mays Paino  [facility]   Pager on [email or web address]. If 7PM-7AM, please contact night-coverage at [email or web address]     12/09/2021, 4:52 PM

## 2021-12-10 DIAGNOSIS — J441 Chronic obstructive pulmonary disease with (acute) exacerbation: Secondary | ICD-10-CM

## 2021-12-10 DIAGNOSIS — Z992 Dependence on renal dialysis: Secondary | ICD-10-CM | POA: Diagnosis not present

## 2021-12-10 DIAGNOSIS — N186 End stage renal disease: Secondary | ICD-10-CM | POA: Diagnosis not present

## 2021-12-10 DIAGNOSIS — I5031 Acute diastolic (congestive) heart failure: Secondary | ICD-10-CM | POA: Diagnosis not present

## 2021-12-10 LAB — GLUCOSE, CAPILLARY
Glucose-Capillary: 214 mg/dL — ABNORMAL HIGH (ref 70–99)
Glucose-Capillary: 247 mg/dL — ABNORMAL HIGH (ref 70–99)

## 2021-12-10 MED ORDER — CHLORHEXIDINE GLUCONATE CLOTH 2 % EX PADS
6.0000 | MEDICATED_PAD | Freq: Every day | CUTANEOUS | Status: DC
  Administered 2021-12-10: 6 via TOPICAL

## 2021-12-10 MED ORDER — PREDNISONE 20 MG PO TABS: 20.0000 mg | ORAL_TABLET | Freq: Every day | ORAL | 0 refills | Status: AC

## 2021-12-10 NOTE — Discharge Summary (Signed)
Physician Discharge Summary  [name] ZOX:[id number] DOB: 27-Jul-1951 DOA: 12/06/2021  PCP: [name], MD  Admit date: 12/06/2021 Discharge date: 12/10/2021  Discharge disposition: Home   Recommendations for Outpatient Follow-Up:   Follow-up with PCP in 1 to 2 weeks. Go to the outpatient hemodialysis center for hemodialysis as scheduled on Tuesdays, Thursdays and Saturdays.  Discharge Diagnosis:   Principal Problem:   Acute CHF (congestive heart failure) (HCC) Active Problems:   COPD (chronic obstructive pulmonary disease) (HCC)   Coronary artery disease involving native coronary artery of native heart without angina pectoris   Anemia of chronic disease   ESRD (end stage renal disease) on dialysis (HCC)   Obesity (BMI 30.0-34.9)   Essential hypertension   Mixed hyperlipidemia   Type 2 diabetes mellitus with hyperglycemia (HCC)   Leukocytosis   Elevated brain natriuretic peptide (BNP) level   Elevated troponin   GERD (gastroesophageal reflux disease)    Discharge Condition: Stable.  Diet recommendation:  Diet Order             Diet - low sodium heart healthy           Diet renal/carb modified with fluid restriction Diet-HS Snack? Nothing; Fluid restriction: 1200 mL Fluid; Room service appropriate? Yes; Fluid consistency: Thin  Diet effective now                     Code Status: Full Code     Hospital Course:   Ms. [name] is a 71 y.o. female  with medical history significant for ESRD HD (TThS),, essential hypertension, hyperlipidemia, CAD s/p stent placement, COPD, GERD, who presented to the hospital with shortness of breath, orthopnea, bilateral hand and leg swelling.  She was admitted to the hospital for acute on chronic diastolic CHF and fluid overload from ESRD.  She was treated with hemodialysis on consecutive days.  Per nephrologist, her new estimated dry weight is 84 kg.    She was also treated with steroids and bronchodilators for  COPD exacerbation.  She developed steroid-induced hyperglycemia which improved with insulin.  Her condition has improved and she is deemed stable for discharge to home today.   Medical Consultants:   Nephrologist   Discharge Exam:    Vitals:   12/09/21 1500 12/09/21 2111 12/09/21 2136 12/10/21 0438  BP: (!) 152/63  122/64 133/66  Pulse: 75  80 72  Resp: 18  19 18   Temp: 98 F (36.7 C)  98.3 F (36.8 C) 98.7 F (37.1 C)  TempSrc: Oral   Oral  SpO2: 95% 96% 97% 93%  Weight:      Height:         GEN: NAD SKIN: Warm and dry EYES: No pallor or icterus ENT: MMM CV: RRR PULM: CTA B ABD: soft, obese, NT, +BS CNS: AAO x 3, non focal EXT: No edema or tenderness   The results of significant diagnostics from this hospitalization (including imaging, microbiology, ancillary and laboratory) are listed below for reference.     Procedures and Diagnostic Studies:   DG Chest Port 1 View  Result Date: 12/06/2021 CLINICAL DATA:  Dyspnea EXAM: PORTABLE CHEST 1 VIEW COMPARISON:  02/19/2021 FINDINGS: The lungs are symmetrically well expanded. Stable cardiomegaly. Interval development of mild perihilar and lower lung zone interstitial pulmonary infiltrate possibly representing cardiogenic pulmonary edema. No pneumothorax. Possible small bilateral pleural effusions. No acute bone abnormality. IMPRESSION: Stable cardiomegaly. Suspected mild superimposed cardiogenic failure. Electronically Signed   By: Cassandria Anger  Christa See M.D.   On: 12/06/2021 03:05   ECHOCARDIOGRAM LIMITED  Result Date: 12/06/2021    ECHOCARDIOGRAM LIMITED REPORT   Patient Name:   [name] Date of Exam: 12/06/2021 Medical Rec #:  [id number]       Height:       63.0 in Accession #:    [id number]      Weight:       185.0 lb Date of Birth:  1951-05-16       BSA:          1.871 m Patient Age:    30 years        BP:           171/79 mmHg Patient Gender: F               HR:           85 bpm. Exam Location:  [facility] Procedure: Limited  Echo Indications:    CHF  History:        Patient has prior history of Echocardiogram examinations, most                 recent 09/16/2021. CHF, CAD, COPD and Stroke, Arrythmias:Atrial                 Fibrillation; Risk Factors:Hypertension, Diabetes and                 Dyslipidemia. End stage Renal disease on Dialysis.  Sonographer:    Wenda Low Referring Phys: [id number] OLADAPO ADEFESO IMPRESSIONS  1. Left ventricular ejection fraction, by estimation, is 55 to 60%. The left ventricle has normal function. The left ventricle has no regional wall motion abnormalities. There is mild left ventricular hypertrophy. Left ventricular diastolic parameters are indeterminate.  2. Right ventricular systolic function is normal. The right ventricular size is normal.  3. The aortic valve is tricuspid. There is mild calcification of the aortic valve. There is mild thickening of the aortic valve.  4. The inferior vena cava is dilated in size with >50% respiratory variability, suggesting right atrial pressure of 8 mmHg.  5. Limited echo to evaluate LV function FINDINGS  Left Ventricle: Left ventricular ejection fraction, by estimation, is 55 to 60%. The left ventricle has normal function. The left ventricle has no regional wall motion abnormalities. The left ventricular internal cavity size was normal in size. There is  mild left ventricular hypertrophy. Left ventricular diastolic parameters are indeterminate. Right Ventricle: The right ventricular size is normal. Right vetricular wall thickness was not well visualized. Right ventricular systolic function is normal. Pericardium: There is no evidence of pericardial effusion. Mitral Valve: There is mild thickening of the mitral valve leaflet(s). There is mild calcification of the mitral valve leaflet(s). Mild mitral annular calcification. Aortic Valve: The aortic valve is tricuspid. There is mild calcification of the aortic valve. There is mild thickening of the aortic valve.  There is mild aortic valve annular calcification. Aorta: The aortic root is normal in size and structure. Venous: The inferior vena cava is dilated in size with greater than 50% respiratory variability, suggesting right atrial pressure of 8 mmHg. LEFT VENTRICLE PLAX 2D LVIDd:         5.00 cm LVIDs:         3.10 cm LV PW:         1.00 cm LV IVS:        1.20 cm LVOT diam:     1.90 cm LVOT Area:  2.84 cm  LV Volumes (MOD) LV vol d, MOD A2C: 61.8 ml LV vol d, MOD A4C: 52.3 ml LV vol s, MOD A2C: 29.3 ml LV vol s, MOD A4C: 25.4 ml LV SV MOD A2C:     32.4 ml LV SV MOD A4C:     52.3 ml LV SV MOD BP:      29.5 ml RIGHT VENTRICLE RV Basal diam:  3.50 cm RV Mid diam:    2.70 cm TAPSE (M-mode): 1.9 cm LEFT ATRIUM         Index LA diam:    5.40 cm 2.89 cm/m   AORTA Ao Root diam: 2.60 cm  SHUNTS Systemic Diam: 1.90 cm Carlyle Dolly MD Electronically signed by Carlyle Dolly MD Signature Date/Time: 12/06/2021/12:07:31 PM    Final      Labs:   Basic Metabolic Panel: Recent Labs  Lab 12/06/21 0137 12/06/21 0438 12/07/21 0622 12/08/21 0533  NA 139  --  133*   136 133*  K 3.6  --  3.3*   3.2* 3.6  CL 99  --  94*   96* 95*  CO2 27  --  27   29 28   GLUCOSE 143*  --  190*   129* 153*  BUN 47*  --  32*   29* 26*  CREATININE 10.38*  --  8.06*   7.49* 6.35*  CALCIUM 10.1  --  8.4*   8.5* 8.9  MG  --  2.5*  --   --   PHOS  --  5.2* 4.1  --    GFR Estimated Creatinine Clearance: 8.6 mL/min (A) (by C-G formula based on SCr of 6.35 mg/dL (H)). Liver Function Tests: Recent Labs  Lab 12/07/21 0622  AST 12*  ALT 13  ALKPHOS 62  BILITOT 0.3  PROT 6.2*  ALBUMIN 3.4*   3.2*   No results for input(s): LIPASE, AMYLASE in the last 168 hours. No results for input(s): AMMONIA in the last 168 hours. Coagulation profile No results for input(s): INR, PROTIME in the last 168 hours.  CBC: Recent Labs  Lab 12/06/21 0137 12/07/21 0622 12/07/21 0935 12/08/21 0533  WBC 15.0* 12.8* 12.3* 10.7*  NEUTROABS  --    --   --  6.0  HGB 11.3* 9.4* 9.4* 9.3*  HCT 35.8* 29.6* 30.2* 30.3*  MCV 90.4 88.9 89.1 88.9  PLT 272 193 192 180   Cardiac Enzymes: No results for input(s): CKTOTAL, CKMB, CKMBINDEX, TROPONINI in the last 168 hours. BNP: Invalid input(s): POCBNP CBG: Recent Labs  Lab 12/09/21 1622 12/09/21 1802 12/09/21 1817 12/09/21 2148 12/10/21 0710  GLUCAP 364* 335* 339* 320* 214*   D-Dimer No results for input(s): DDIMER in the last 72 hours. Hgb A1c No results for input(s): HGBA1C in the last 72 hours. Lipid Profile No results for input(s): CHOL, HDL, LDLCALC, TRIG, CHOLHDL, LDLDIRECT in the last 72 hours. Thyroid function studies No results for input(s): TSH, T4TOTAL, T3FREE, THYROIDAB in the last 72 hours.  Invalid input(s): FREET3 Anemia work up No results for input(s): VITAMINB12, FOLATE, FERRITIN, TIBC, IRON, RETICCTPCT in the last 72 hours. Microbiology Recent Results (from the past 240 hour(s))  Resp Panel by RT-PCR (Flu A&B, Covid) Nasopharyngeal Swab     Status: None   Collection Time: 12/06/21  3:26 AM   Specimen: Nasopharyngeal Swab; Nasopharyngeal(NP) swabs in vial transport medium  Result Value Ref Range Status   SARS Coronavirus 2 by RT PCR NEGATIVE NEGATIVE Final    Comment: (NOTE) SARS-CoV-2  target nucleic acids are NOT DETECTED.  The SARS-CoV-2 RNA is generally detectable in upper respiratory specimens during the acute phase of infection. The lowest concentration of SARS-CoV-2 viral copies this assay can detect is 138 copies/mL. A negative result does not preclude SARS-Cov-2 infection and should not be used as the sole basis for treatment or other patient management decisions. A negative result may occur with  improper specimen collection/handling, submission of specimen other than nasopharyngeal swab, presence of viral mutation(s) within the areas targeted by this assay, and inadequate number of viral copies(<138 copies/mL). A negative result must be  combined with clinical observations, patient history, and epidemiological information. The expected result is Negative.  Fact Sheet for Patients:  EntrepreneurPulse.com.au  Fact Sheet for Healthcare Providers:  IncredibleEmployment.be  This test is no t yet approved or cleared by the [place] FDA and  has been authorized for detection and/or diagnosis of SARS-CoV-2 by FDA under an Emergency Use Authorization (EUA). This EUA will remain  in effect (meaning this test can be used) for the duration of the COVID-19 declaration under Section 564(b)(1) of the Act, 21 U.S.C.section 360bbb-3(b)(1), unless the authorization is terminated  or revoked sooner.       Influenza A by PCR NEGATIVE NEGATIVE Final   Influenza B by PCR NEGATIVE NEGATIVE Final    Comment: (NOTE) The Xpert Xpress SARS-CoV-2/FLU/RSV plus assay is intended as an aid in the diagnosis of influenza from Nasopharyngeal swab specimens and should not be used as a sole basis for treatment. Nasal washings and aspirates are unacceptable for Xpert Xpress SARS-CoV-2/FLU/RSV testing.  Fact Sheet for Patients: EntrepreneurPulse.com.au  Fact Sheet for Healthcare Providers: IncredibleEmployment.be  This test is not yet approved or cleared by the [place] FDA and has been authorized for detection and/or diagnosis of SARS-CoV-2 by FDA under an Emergency Use Authorization (EUA). This EUA will remain in effect (meaning this test can be used) for the duration of the COVID-19 declaration under Section 564(b)(1) of the Act, 21 U.S.C. section 360bbb-3(b)(1), unless the authorization is terminated or revoked.  Performed at [facility], [address] [place], [place] [address]      Discharge Instructions:   Discharge Instructions     Diet - low sodium heart healthy   Complete by: As directed    Discharge instructions   Complete by: As directed     Continue outpatient hemodialysis on Tuesdays, Thursdays and Saturdays   Increase activity slowly   Complete by: As directed       Allergies as of 12/10/2021       Reactions   Azithromycin    Prolonged QT   Ace Inhibitors Cough   Clopidogrel Bisulfate Other (See Comments)   Sick, Headache, "Felt terrible"   Codeine Other (See Comments)   hallucinations   Lisinopril Cough   Penicillins Other (See Comments)   Unknown reaction Did it involve swelling of the face/tongue/throat, SOB, or low BP? Unknown Did it involve sudden or severe rash/hives, skin peeling, or any reaction on the inside of your mouth or nose? Unknown Did you need to seek medical attention at a hospital or doctor's office? Unknown When did it last happen?      childhood allergy If all above answers are "NO", may proceed with cephalosporin use.        Medication List     TAKE these medications    acetaminophen 500 MG tablet Commonly known as: TYLENOL Take 1,000 mg by mouth every 6 (six) hours as needed  for moderate pain or headache.   albuterol 108 (90 Base) MCG/ACT inhaler Commonly known as: VENTOLIN HFA Inhale 2 puffs into the lungs every 6 (six) hours as needed for wheezing or shortness of breath.   albuterol (2.5 MG/3ML) 0.083% nebulizer solution Commonly known as: PROVENTIL Take 3 mLs (2.5 mg total) by nebulization every 6 (six) hours as needed for wheezing or shortness of breath.   aspirin 81 MG EC tablet Take 81 mg by mouth daily.   atenolol 50 MG tablet Commonly known as: TENORMIN Take 1 tablet (50 mg total) by mouth 2 (two) times daily.   Basaglar KwikPen 100 UNIT/ML Inject 25 Units into the skin at bedtime. Add five units each time the fasting is over 125 three days in a row.   budesonide-formoterol 160-4.5 MCG/ACT inhaler Commonly known as: SYMBICORT Inhale 2 puffs into the lungs 2 (two) times daily.   busPIRone 7.5 MG tablet Commonly known as: BUSPAR Take 1 tablet (7.5 mg total) by  mouth 3 (three) times daily as needed.   calcium acetate 667 MG capsule Commonly known as: PHOSLO Take 667-1,334 mg by mouth See admin instructions. Take 1334 mg with meals 3 times daily and 667 mg with snacks   cetirizine 10 MG tablet Commonly known as: ZYRTEC Take 1 tablet (10 mg total) by mouth daily as needed for allergies.   DULoxetine 20 MG capsule Commonly known as: CYMBALTA Take 20 mg by mouth daily.   FreeStyle Libre 2 Reader Kerrin Mo USE TO TEST BLOOD SUGAR CONTINUOUSLY   FreeStyle Libre 2 Sensor Misc Use multiple times daily to track glucose to prevent highs and lows as a complication of dialysis Dx:Z99.2, E11.59   gabapentin 400 MG capsule Commonly known as: NEURONTIN Take 1 capsule (400 mg total) by mouth See admin instructions. Take it after HD   LUBRICATING EYE DROPS OP Place 1 drop into both eyes daily as needed (dry eyes).   montelukast 10 MG tablet Commonly known as: SINGULAIR Take 10 mg by mouth at bedtime.   NIFEdipine 60 MG 24 hr tablet Commonly known as: PROCARDIA XL/NIFEDICAL XL TAKE 1 TABLET BY MOUTH ONCE DAILY (ON  NON-DIALYSIS  DAYS)   nitroGLYCERIN 0.4 MG SL tablet Commonly known as: NITROSTAT Place 1 tablet (0.4 mg total) under the tongue every 5 (five) minutes as needed for chest pain.   omeprazole 20 MG capsule Commonly known as: PRILOSEC Take 1 capsule (20 mg total) by mouth daily.   ondansetron 4 MG tablet Commonly known as: ZOFRAN TAKE 1 TABLET BY MOUTH EVERY 8 HOURS AS NEEDED FOR NAUSEA AND VOMITING   OneTouch Ultra test strip Generic drug: glucose blood Use to test blood sugar 3 times daily as directed. DX: E11.9   Ozempic (0.25 or 0.5 MG/DOSE) 2 MG/1.5ML Sopn Generic drug: Semaglutide(0.25 or 0.5MG /DOS) Inject 0.25 mg into the skin once a week. Friday   predniSONE 20 MG tablet Commonly known as: DELTASONE Take 1 tablet (20 mg total) by mouth daily for 2 days.   QUEtiapine 50 MG tablet Commonly known as: SEROQUEL Take 1  tablet (50 mg total) by mouth at bedtime. For sleep and for anxiety   rOPINIRole 1 MG tablet Commonly known as: REQUIP Take 1 tablet (1 mg total) by mouth at bedtime. For leg cramps   rosuvastatin 5 MG tablet Commonly known as: Crestor Take 1 tablet (5 mg total) by mouth daily. For cholesterol           If you experience worsening of your admission  symptoms, develop shortness of breath, life threatening emergency, suicidal or homicidal thoughts you must seek medical attention immediately by calling 911 or calling your MD immediately  if symptoms less severe.   You must read complete instructions/literature along with all the possible adverse reactions/side effects for all the medicines you take and that have been prescribed to you. Take any new medicines after you have completely understood and accept all the possible adverse reactions/side effects.    Please note   You were cared for by a hospitalist during your hospital stay. If you have any questions about your discharge medications or the care you received while you were in the hospital after you are discharged, you can call the unit and asked to speak with the hospitalist on call if the hospitalist that took care of you is not available. Once you are discharged, your primary care physician will handle any further medical issues. Please note that NO REFILLS for any discharge medications will be authorized once you are discharged, as it is imperative that you return to your primary care physician (or establish a relationship with a primary care physician if you do not have one) for your aftercare needs so that they can reassess your need for medications and monitor your lab values.       Time coordinating discharge: 32 minutes  Signed:  Tanasia Budzinski  [facility] 12/10/2021, 9:02 AM   Pager on [email or web address]. If 7PM-7AM, please contact night-coverage at [email or web address]

## 2021-12-10 NOTE — Procedures (Signed)
° °  HEMODIALYSIS TREATMENT NOTE:   Uneventful 3 hour heparin-free treatment completed using right upper arm AVF (16g/antegrade). Goal met: 3 liters removed without interruption in UF.  All blood was returned and hemostasis was achieved in 15 minutes.  Standing weight 84kg.   Rockwell Alexandria, RN

## 2021-12-11 NOTE — Patient Instructions (Signed)
Visit Information  Thank you for taking time to visit with me today. Please don't hesitate to contact me if I can be of assistance to you before our next scheduled telephone appointment.  Following are the goals we discussed today:  Current Barriers:  Unable to independently afford treatment regimen Unable to maintain control of T2DM, HLD Suboptimal therapeutic regimen for T2DM  Pharmacist Clinical Goal(s):  patient will verbalize ability to afford treatment regimen achieve control of T2DM as evidenced by GOAL A1C, IMPROVED BLOOD SUGARS WITH STEADY CONTROL through collaboration with PharmD and provider.   Interventions: 1:1 collaboration with [name], MD regarding development and update of comprehensive plan of care as evidenced by provider attestation and co-signature Inter-disciplinary care team collaboration (see longitudinal plan of care) Comprehensive medication review performed; medication list updated in electronic medical record  Diabetes: Goal on Track (progressing): YES. Uncontrolled--A1C 7.9%; current treatment: BASAGLAR 20-25 UNITS, Ozempic 0.25mg  SQ WEEKLY INTOLERANCES:  DID NOT TOLERATE TRULICITY WELL DUE TO GI (EVEN AT LOWEST DOSE) T2DM REGIMEN LIMITED DUE TO HD TIW CONTINUE OZEMPIC TO 0.25MG  SQ WEEKLY Denies personal and family history of Medullary thyroid cancer (MTC) Sample given TOLERATING LOW DOSE OZEMPIC; OCCASIONALLY USES ZOFRAN FOR NAUSEA, BUT HAS OTHER CAUSES OF NAUSEA AS WELL Application submitted to [place] for ozempic patient assistance LIBRE COVERED UNDER INSURANCE FOR $0 COPAY RE-SUBMITTED TO [employer]IA [place] TO CHECK ON COVERAGE--[facility] and [place] fulfilled libre order (need to d/c one of them)-->will follow up on on 12/22/21 Libre 2 CGM education provided Patient to use phone as reader--set up [place] 2 app Left arm (since graft on right side) Patient RE-enrolled in [employer] patient assistance program for free  basgalar (med ships to PCP Office due to patient's HD schedule) Current glucose readings: fasting glucose: <155, post prandial glucose: n/a Reports hyperglycemic symptoms Must follow dialysis diet Current exercise: n/a; HD TIW Recommended continue low dose ozempic; new libre start--patient coming home from the hospital, therefore Elenor Legato was removed--additional sample provided--I must clarify with company which one to proceed with (only one can fill her [place]) Assessed patient finances. Submitted for Ozempic PATIENT ASSISTANCE via [employer]; APPLICATION SUBMITTED FOR SYMBICORT TO [facility] PATIENT ASSISTANCE--PATIENT DID NOT LIKE BREZTRI (received symbicort shipment at our office on 12/2021)  Hyperlipidemia:  Condition stable. Not addressed this visit. Uncontrolled; current treatment:CRESTOR (ROSUVASTATIN 5MG  DAILY);  Current dietary patterns: ON HD 3X WEEKLY; NOT HUGE APPETITE, HOWEVER DOES ENJOY SWEETS, OZEMPIC TO ASSIST Recommended HEART HEALTHY DIET, TAKE STATIN AS PRESCRIBED, NEW START OZEMPIC SHOULD LIKELY HELP WITH LIPID PANEL Lipid Panel     Component Value Date/Time   CHOL 203 (H) 01/31/2021 1001   CHOL 241 (H) 03/25/2013 1026   TRIG 240 (H) 01/31/2021 1001   TRIG 147 10/09/2013 1527   TRIG 209 (H) 03/25/2013 1026   HDL 36 (L) 01/31/2021 1001   HDL 65 10/09/2013 1527   HDL 51 03/25/2013 1026   CHOLHDL 5.6 (H) 01/31/2021 1001   CHOLHDL 3.0 06/17/2008 0625   VLDL 19 06/17/2008 0625   LDLCALC 124 (H) 01/31/2021 1001   LDLCALC 125 (H) 10/09/2013 1527   LDLCALC 148 (H) 03/25/2013 1026   LABVLDL 43 (H) 01/31/2021 1001   Patient Goals/Self-Care Activities patient will:  - take medications as prescribed as evidenced by patient report and record review check glucose DAILY OR IF SYMPTOMATIC, document, and provide at future appointments engage in dietary modifications by HEART HEALTHY/dialysis DIET   Please call the care guide team at  [phone] if you need to cancel or reschedule  your appointment.   The patient verbalized understanding of instructions, educational materials, and care plan provided today and declined offer to receive copy of patient instructions, educational materials, and care plan.   Signature Regina Eck, PharmD, BCPS Clinical Pharmacist, [place]  [facility] Phone [phone]

## 2021-12-11 NOTE — Progress Notes (Signed)
Chronic Care Management Pharmacy Note  12/08/2021 Name:  [name] MRN:  [id number] DOB:  1951/03/01  Summary: t2dm, hld, htn  Recommendations/Changes made from today's visit:  Diabetes: Goal on Track (progressing): YES. Uncontrolled--A1C 7.9%; current treatment: BASAGLAR 20-25 UNITS, Ozempic 0.72m SQ WEEKLY INTOLERANCES:  DID NOT TOLERATE TRULICITY WELL DUE TO GI (EVEN AT LOWEST DOSE) T2DM REGIMEN LIMITED DUE TO HD TIW CONTINUE OZEMPIC TO 0.25MG SQ WEEKLY Denies personal and family history of Medullary thyroid cancer (MTC) Sample given TOLERATING LOW DOSE OZEMPIC; OCCASIONALLY USES ZOFRAN FOR NAUSEA, BUT HAS OTHER CAUSES OF NAUSEA AS WELL [place] patient [place] LIBRE COVERED UNDER INSURANCE FOR $0 COPAY RE-SUBMITTED TO [facility] VIA [place] PORTAL TO CHECK ON COVERAGE--ADS and TFar[place]fulfilled libre order (need to d/c one of them)-->will follow up on on 12/22/21 Libre 2 CGM education provided Patient to use phone as reader--set up [place] app Left arm (since graft on right side) Patient RE-enrolled in [place]Assurantpatient assistance program for free basgalar (med ships to PCP Office due to patient's HD schedule) Current glucose readings: fasting glucose: <155, post prandial glucose: n/a Reports hyperglycemic symptoms Must follow dialysis diet Current exercise: n/a; HD TIW Recommended continue low dose ozempic; new libre start--patient coming home from the hospital, therefore lElenor Legatowas removed--additional sample provided--I must clarify with company which one to proceed with (only one can fill her [place] Assessed patient finances. Submitted for Ozempic PATIENT ASSISTANCE via novo nordisk; APPLICATION SUBMITTED FOR SYMBICORT TO az&ME PATIENT ASSISTANCE--PATIENT DID NOT LIKE BREZTRI (received symbicort shipment at our office on 12/2021)  Hyperlipidemia:  Condition stable. Not addressed this visit. Uncontrolled; current  treatment:CRESTOR (ROSUVASTATIN 5MG DAILY);  Current dietary patterns: ON HD 3X WEEKLY; NOT HUGE APPETITE, HOWEVER DOES ENJOY SWEETS, OZEMPIC TO ASSIST Recommended HEART HEALTHY DIET, TAKE STATIN AS PRESCRIBED, NEW START OZEMPIC SHOULD LIKELY HELP WITH LIPID PANEL Lipid Panel     Component Value Date/Time   CHOL 203 (H) 01/31/2021 1001   CHOL 241 (H) 03/25/2013 1026   TRIG 240 (H) 01/31/2021 1001   TRIG 147 10/09/2013 1527   TRIG 209 (H) 03/25/2013 1026   HDL 36 (L) 01/31/2021 1001   HDL 65 10/09/2013 1527   HDL 51 03/25/2013 1026   CHOLHDL 5.6 (H) 01/31/2021 1001   CHOLHDL 3.0 06/17/2008 0625   VLDL 19 06/17/2008 0625   LDLCALC 124 (H) 01/31/2021 1001   LDLCALC 125 (H) 10/09/2013 1527   LDLCALC 148 (H) 03/25/2013 1026   LABVLDL 43 (H) 01/31/2021 1001   Patient Goals/Self-Care Activities patient will:  - take medications as prescribed as evidenced by patient report and record review check glucose DAILY OR IF SYMPTOMATIC, document, and provide at future appointments engage in dietary modifications by HEART HEALTHY/dialysis DIET  Plan: f/u 12/22/21  Subjective: Tamara LANGELIERis an 71y.o. year old female who is a primary patient of [name], WCletus Gash MD.  The CCM team was consulted for assistance with disease management and care coordination needs.    Engaged with patient by telephone for follow up visit in response to provider referral for pharmacy case management and/or care coordination services.   Consent to Services:  The patient was given information about Chronic Care Management services, agreed to services, and gave verbal consent prior to initiation of services.  Please see initial visit note for detailed documentation.   Patient Care Team: SClaretta Fraise MD as PCP - General (Family Medicine) HMinus Breeding MD as  PCP - Cardiology (Cardiology) Fran Lowes, MD (Inactive) as Consulting Physician (Nephrology) [name], MD as Referring Physician (Family  Medicine) Minus Breeding, MD as Consulting Physician (Cardiology) [place], [facility], Delice Bison, RN as Case Manager Lavera Guise, [facility] (Pharmacist) [place], [place] [facility]  Objective:  Lab Results  Component Value Date   CREATININE 6.35 (H) 12/08/2021   CREATININE 7.49 (H) 12/07/2021   CREATININE 8.06 (H) 12/07/2021    Lab Results  Component Value Date   HGBA1C 7.3 (H) 12/06/2021   Last diabetic Eye exam:  Lab Results  Component Value Date/Time   HMDIABEYEEXA No Retinopathy 11/02/2021 12:00 AM    Last diabetic Foot exam: No results found for: HMDIABFOOTEX      Component Value Date/Time   CHOL 203 (H) 01/31/2021 1001   CHOL 241 (H) 03/25/2013 1026   TRIG 240 (H) 01/31/2021 1001   TRIG 147 10/09/2013 1527   TRIG 209 (H) 03/25/2013 1026   HDL 36 (L) 01/31/2021 1001   HDL 65 10/09/2013 1527   HDL 51 03/25/2013 1026   CHOLHDL 5.6 (H) 01/31/2021 1001   CHOLHDL 3.0 06/17/2008 0625   VLDL 19 06/17/2008 0625   LDLCALC 124 (H) 01/31/2021 1001   LDLCALC 125 (H) 10/09/2013 1527   LDLCALC 148 (H) 03/25/2013 1026    Hepatic Function Latest Ref Rng & Units 12/07/2021 12/07/2021 02/19/2021  Total Protein 6.5 - 8.1 g/dL - 6.2(L) 6.8  Albumin 3.5 - 5.0 g/dL 3.4(L) 3.2(L) 3.3(L)  AST 15 - 41 U/L - 12(L) 17  ALT 0 - 44 U/L - 13 21  Alk Phosphatase 38 - 126 U/L - 62 87  Total Bilirubin 0.3 - 1.2 mg/dL - 0.3 0.2(L)  Bilirubin, Direct 0.0 - 0.2 mg/dL - - <0.1    Lab Results  Component Value Date/Time   TSH 2.450 03/07/2021 12:32 PM   TSH 2.261 03/05/2015 08:48 AM    CBC Latest Ref Rng & Units 12/08/2021 12/07/2021 12/07/2021  WBC 4.0 - 10.5 K/uL 10.7(H) 12.3(H) 12.8(H)  Hemoglobin 12.0 - 15.0 g/dL 9.3(L) 9.4(L) 9.4(L)  Hematocrit 36.0 - 46.0 % 30.3(L) 30.2(L) 29.6(L)  Platelets 150 - 400 K/uL 180 192 193    Lab Results  Component Value Date/Time   VD25OH 26.1 (L) 10/09/2013 03:27 PM   VD25OH 36 03/25/2013 10:26 AM    Clinical  ASCVD: No  The ASCVD Risk score (Arnett DK, et al., 2019) failed to calculate for the following reasons:   The valid total cholesterol range is 130 to 320 mg/dL    Other: (CHADS2VASc if Afib, PHQ9 if depression, MMRC or CAT for COPD, ACT, DEXA)  Social History   Tobacco Use  Smoking Status Former   Packs/day: 1.00   Years: 30.00   Pack years: 30.00   Types: Cigarettes   Quit date: 12/04/2001   Years since quitting: 20.0  Smokeless Tobacco Never   BP Readings from Last 3 Encounters:  12/10/21 (!) 141/78  12/11/21 129/75  11/09/21 138/70   Pulse Readings from Last 3 Encounters:  12/10/21 65  11/09/21 68  09/15/21 84   Wt Readings from Last 3 Encounters:  12/10/21 185 lb 3 oz (84 kg)  11/09/21 197 lb (89.4 kg)  09/15/21 192 lb 12.8 oz (87.5 kg)    Assessment: Review of patient past medical history, allergies, medications, health status, including review of consultants reports, laboratory and other test data, was performed as part of comprehensive evaluation and provision of chronic care  management services.   SDOH:  (Social Determinants of Health) assessments and interventions performed:    CCM Care Plan  Allergies  Allergen Reactions   Azithromycin     Prolonged QT   Ace Inhibitors Cough   Clopidogrel Bisulfate Other (See Comments)    Sick, Headache, "Felt terrible"   Codeine Other (See Comments)    hallucinations   Lisinopril Cough   Penicillins Other (See Comments)    Unknown reaction Did it involve swelling of the face/tongue/throat, SOB, or low BP? Unknown Did it involve sudden or severe rash/hives, skin peeling, or any reaction on the inside of your mouth or nose? Unknown Did you need to seek medical attention at a hospital or doctor's office? Unknown When did it last happen?      childhood allergy If all above answers are "NO", may proceed with cephalosporin use.      Medications Reviewed Today     Reviewed by Lavera Guise, [facility] (Pharmacist) on  12/11/21 at 2334  Med List Status: <None>   Medication Order Taking? Sig Documenting Provider Last Dose Status Informant  acetaminophen (TYLENOL) 500 MG tablet [id number] No Take 1,000 mg by mouth every 6 (six) hours as needed for moderate pain or headache. [provider] Taking Active Self  albuterol (PROVENTIL) (2.5 MG/3ML) 0.083% nebulizer solution [id number] No Take 3 mLs (2.5 mg total) by nebulization every 6 (six) hours as needed for wheezing or shortness of breath. [name], MD Past Week Active Self  albuterol (VENTOLIN HFA) 108 (90 Base) MCG/ACT inhaler [id number] No Inhale 2 puffs into the lungs every 6 (six) hours as needed for wheezing or shortness of breath. [name], MD Past Week Active Self  aspirin 81 MG EC tablet [id number] No Take 81 mg by mouth daily. [provider] 12/05/2021 Active Self  atenolol (TENORMIN) 50 MG tablet [id number] No Take 1 tablet (50 mg total) by mouth 2 (two) times daily. [name], MD 12/05/2021 2000 Active Self  budesonide-formoterol (SYMBICORT) 160-4.5 MCG/ACT inhaler [id number] No Inhale 2 puffs into the lungs 2 (two) times daily. [name], MD 12/05/2021 Active Self           Med Note (Chalene Treu, Omer Jack Dec 11, 2021 11:34 PM) Via az&me patient assistance program--escribe to [facility] mail order pharm  busPIRone (BUSPAR) 7.5 MG tablet [id number] No Take 1 tablet (7.5 mg total) by mouth 3 (three) times daily as needed. [name], MD 12/05/2021 Active Self  calcium acetate (PHOSLO) 667 MG capsule [id number] No Take 667-1,334 mg by mouth See admin instructions. Take 1334 mg with meals 3 times daily and 667 mg with snacks [provider] 12/05/2021 Active Self  Carboxymethylcellul-Glycerin (LUBRICATING EYE DROPS OP) [id number] No Place 1 drop into both eyes daily as needed (dry eyes). [provider] unk Active Self  cetirizine (ZYRTEC) 10 MG tablet [id number] No Take 1 tablet (10 mg total) by mouth daily as needed for  allergies. [name], MD unk Active Self  Continuous Blood Gluc Receiver (FREESTYLE LIBRE 2 READER) [place]in Mo [id number]  USE TO TEST BLOOD SUGAR Liborio Nixon, MD  Active Self  Continuous Blood Gluc Sensor (FREESTYLE LIBRE 2 SENSOR) [place] [id number]  Use multiple times daily to track glucose to prevent highs and lows as a complication of dialysis Dx:Z99.2, E11.59 [name], MD  Active Self           Med Note Gilmer Mor Nov 15, 2021 11:39 AM) [facility]  VIA [place] PORTAL ON 11/15/21  DULoxetine (CYMBALTA) 20 MG capsule [id number] No Take 20 mg by mouth daily. [provider] 12/05/2021 Active Self  gabapentin (NEURONTIN) 400 MG capsule [id number] No Take 1 capsule (400 mg total) by mouth See admin instructions. Take it after HD Barton Dubois, MD Past Week Active Self  glucose blood (ONETOUCH ULTRA) test strip [id number] No Use to test blood sugar 3 times daily as directed. DX: E11.9 [name], MD Taking Active Self  Insulin Glargine [facility] KWIKPEN) 100 UNIT/ML [id number] No Inject 25 Units into the skin at bedtime. Add five units each time the fasting is over 125 three days in a row. [name], MD Past Week Active Self           Med Note Lavera Guise   Tue Nov 15, 2021 11:45 AM) VIA LILLY CARES PATIENT ASSISTANCE PROGRAM--ESCRIBE CHANGES OR REFILLS TO [facility] [place] PER PROGRAM   montelukast (SINGULAIR) 10 MG tablet [id number] No Take 10 mg by mouth at bedtime. [provider] 12/05/2021 Active Self  NIFEdipine (PROCARDIA XL/NIFEDICAL XL) 60 MG 24 hr tablet [id number] No TAKE 1 TABLET BY MOUTH ONCE DAILY (ON  NON-DIALYSIS  DAYS) Minus Breeding, MD 12/05/2021 Active Self  nitroGLYCERIN (NITROSTAT) 0.4 MG SL tablet [id number] No Place 1 tablet (0.4 mg total) under the tongue every 5 (five) minutes as needed for chest pain. Minus Breeding, MD unk Active Self  omeprazole (PRILOSEC) 20 MG capsule [id number] No Take 1  capsule (20 mg total) by mouth daily. Ivy Lynn, NP 12/05/2021 Active Self  ondansetron (ZOFRAN) 4 MG tablet [id number] No TAKE 1 TABLET BY MOUTH EVERY 8 HOURS AS NEEDED FOR NAUSEA AND VOMITING [name], MD unk Active Self  predniSONE (DELTASONE) 20 MG tablet [id number]  Take 1 tablet (20 mg total) by mouth daily for 2 days. Jennye Boroughs, MD  Active   QUEtiapine (SEROQUEL) 50 MG tablet [id number] No Take 1 tablet (50 mg total) by mouth at bedtime. For sleep and for anxiety [name], MD 12/05/2021 Active Self  rOPINIRole (REQUIP) 1 MG tablet [id number] No Take 1 tablet (1 mg total) by mouth at bedtime. For leg cramps [name], MD 12/05/2021 Active Self  rosuvastatin (CRESTOR) 5 MG tablet [id number] No Take 1 tablet (5 mg total) by mouth daily. For cholesterol [name], MD 12/05/2021 Active Self           Med Note Gentry Roch   Thu Apr 14, 2021  3:20 PM)    Semaglutide,0.25 or 0.5MG/DOS, (OZEMPIC, 0.25 OR 0.5 MG/DOSE,) 2 MG/1.5ML SOPN [id number] No Inject 0.25 mg into the skin once a week. Friday [provider] Past Week Active Self           Med Note Lavera Guise   Fri Sep 30, 2021  2:35 PM) Sample given            Patient Active Problem List   Diagnosis Date Noted   Acute CHF (congestive heart failure) ([place]) 12/06/2021   Leukocytosis 12/06/2021   Elevated brain natriuretic peptide (BNP) level 12/06/2021   Elevated troponin 12/06/2021   GERD (gastroesophageal reflux disease) 12/06/2021   Nausea 08/26/2021   Chest pain 02/19/2021   Essential hypertension    Mixed hyperlipidemia    Type 2 diabetes mellitus with hyperglycemia ([place])    Hypoalbuminemia    Bruit 01/05/2020   Educated about COVID-19 virus infection 01/05/2020   End stage renal disease on dialysis due to type 2 diabetes  mellitus ([place]) 07/21/2019   Lung nodule 05/01/2018   Prolonged Q-T interval on ECG 09/06/2017   Coronary artery disease involving native coronary artery of native  heart without angina pectoris 09/06/2017   GAD (generalized anxiety disorder) 03/04/2016   Leg pain 01/07/2016   Anemia of chronic disease 12/10/2015   Dependence on renal dialysis ([place]) 12/10/2015   ESRD (end stage renal disease) on dialysis ([place]) 12/10/2015   Osteopathy in diseases classified elsewhere, unspecified site 12/10/2015   Constipation 10/25/2015   COPD (chronic obstructive pulmonary disease) ([place]) 03/06/2015   Pericardial effusion 03/06/2015   Chronic diastolic CHF (congestive heart failure) ([place]) 03/05/2015   PAD (peripheral artery disease) ([place]) 04/21/2014   Dyspnea 09/29/2013   Obesity (BMI 30.0-34.9) 12/05/2011   Diverticulosis 03/29/2011   Diabetic neuropathy ([place]) 03/29/2011   Esophageal stricture 03/29/2011   Vitamin D deficiency 03/29/2011   Degenerative joint disease of left shoulder 03/29/2011   Neck pain 03/29/2011   Type II diabetes mellitus with nephropathy ([place]) 04/19/2009   Hyperlipidemia associated with type 2 diabetes mellitus ([place]) 04/19/2009   Hypertension associated with diabetes ([place]) 04/19/2009   Coronary atherosclerosis 04/19/2009    Immunization History  Administered Date(s) Administered   DTaP 07/19/2010   Hepatitis B, adult 12/04/2015, 01/04/2016, 02/08/2016, 06/01/2016, 10/17/2016, 11/11/2016, 12/16/2016, 04/12/2017, 04/08/2019   Influenza Split 10/04/2013, 09/14/2015   Influenza Whole 09/03/2006   Influenza, Seasonal, Injecte, Preservative Fre 08/24/2011, 09/25/2012   Influenza,inj,Quad PF,6+ Mos 09/11/2013, 09/09/2014   Influenza-Unspecified 09/14/2015, 08/25/2016, 08/21/2019, 09/09/2020   Moderna SARS-COV2 Booster Vaccination 11/03/2020   Moderna Sars-Covid-2 Vaccination 02/09/2020, 03/11/2020   PPD Test 11/23/2015, 12/26/2016, 01/03/2018, 01/09/2019, 04/20/2020, 03/31/2021   Pneumococcal Conjugate-13 07/07/2017   Pneumococcal Polysaccharide-23 08/26/2012, 07/07/2017, 12/13/2018   Pneumococcal-Unspecified 08/26/2012, 07/07/2017   Td  07/19/2010   Tdap 08/24/2011   Zoster Recombinat (Shingrix) 09/09/2019, 11/10/2019    Conditions to be addressed/monitored: HTN, HLD, DMII, and ESRD  Care Plan : PHARMD MEDICATION MANAGEMENT  Updates made by Lavera Guise, RPH since 12/11/2021 12:00 AM     Problem: DISEASE PROGRESSION PREVENTION      Long-Range Goal: T2DM, HLD   Recent Progress: Not on track  Priority: High  Note:   Current Barriers:  Unable to independently afford treatment regimen Unable to maintain control of T2DM, HLD Suboptimal therapeutic regimen for T2DM  Pharmacist Clinical Goal(s):  patient will verbalize ability to afford treatment regimen achieve control of T2DM as evidenced by GOAL A1C, IMPROVED BLOOD SUGARS WITH STEADY CONTROL  through collaboration with PharmD and provider.   Interventions: 1:1 collaboration with [name], MD regarding development and update of comprehensive plan of care as evidenced by provider attestation and co-signature Inter-disciplinary care team collaboration (see longitudinal plan of care) Comprehensive medication review performed; medication list updated in electronic medical record  Diabetes: Goal on Track (progressing): YES. Uncontrolled--A1C 7.9%; current treatment: BASAGLAR 20-25 UNITS, Ozempic 0.49m SQ WEEKLY INTOLERANCES:  DID NOT TOLERATE TRULICITY WELL DUE TO GI (EVEN AT LOWEST DOSE) T2DM REGIMEN LIMITED DUE TO HD TIW CONTINUE OZEMPIC TO 0.25MG SQ WEEKLY Denies personal and family history of Medullary thyroid cancer (MTC) Sample given TOLERATING LOW DOSE OZEMPIC; OCCASIONALLY USES ZOFRAN FOR NAUSEA, BUT HAS OTHER CAUSES OF NAUSEA AS WELL Application submitted to [place] ozem[place] patient assistance LIBRE COVERED UNDER INSURANCE FOR $0 COPAY RE-SUBMITTED TO [facility] [facility] VIA [place] PORTAL TO CHECK ON COVERAGE--ADS and [place] libre order (need to d/c one of them)-->will follow up on on 12/22/21 Libre 2 CGM education  provided Patient to use phone as reader--set up [place] 2 app Left arm (since graft on right side) Patient RE-enrolled in [employer] patient assistance program for free basgalar (med ships to PCP Office due to patient's HD schedule) Current glucose readings: fasting glucose: <155, post prandial glucose: n/a Reports hyperglycemic symptoms Must follow dialysis diet Current exercise: n/a; HD TIW Recommended continue low dose ozempic; new libre start--patient coming home from the hospital, therefore Elenor [place] was removed--additional sample provided--I must clarify with company which one to proceed with (only one can fill her [place]) Assessed patient finances. Submitted for Ozempic PATIENT ASSISTANCE via novo nordisk; APPLICATION SUBMITTED FOR SYMBICORT TO az&ME PATIENT ASSISTANCE--PATIENT DID NOT LIKE BREZTRI  (received symbicort shipment at our office on 12/2021)  Hyperlipidemia:  Condition stable. Not addressed this visit. Uncontrolled; current treatment:CRESTOR (ROSUVASTATIN 5MG DAILY);  Current dietary patterns: ON HD 3X WEEKLY; NOT HUGE APPETITE, HOWEVER DOES ENJOY SWEETS, OZEMPIC TO ASSIST Recommended HEART HEALTHY DIET, TAKE STATIN AS PRESCRIBED, NEW START OZEMPIC SHOULD LIKELY HELP WITH LIPID PANEL Lipid Panel     Component Value Date/Time   CHOL 203 (H) 01/31/2021 1001   CHOL 241 (H) 03/25/2013 1026   TRIG 240 (H) 01/31/2021 1001   TRIG 147 10/09/2013 1527   TRIG 209 (H) 03/25/2013 1026   HDL 36 (L) 01/31/2021 1001   HDL 65 10/09/2013 1527   HDL 51 03/25/2013 1026   CHOLHDL 5.6 (H) 01/31/2021 1001   CHOLHDL 3.0 06/17/2008 0625   VLDL 19 06/17/2008 0625   LDLCALC 124 (H) 01/31/2021 1001   LDLCALC 125 (H) 10/09/2013 1527   LDLCALC 148 (H) 03/25/2013 1026   LABVLDL 43 (H) 01/31/2021 1001  Patient Goals/Self-Care Activities patient will:  - take medications as prescribed as evidenced by patient report and record review check glucose DAILY OR IF SYMPTOMATIC, document, and provide at  future appointments engage in dietary modifications by HEART HEALTHY/dialysis DIET      Medication Assistance:    Basaglar--lilly cares Symbicort--az& me Ozempic--novo nordisk pending  Patient's preferred pharmacy is:  [facility] [place], [address] [place] [address] Phone: [phone] Fax: [place], [place] [address] [place] [address] Phone: [phone] Fax: [phone]  [facility] [place], [place] - [address] [place] [address] Phone: [phone] Fax: [phone]  Follow Up:  Patient agrees to Care Plan and Follow-up.  Plan: Telephone follow up appointment with care management team member scheduled for:  12/13/21   Regina Eck, PharmD, BCPS Clinical Pharmacist, [place]  [facility] Phone [phone]

## 2021-12-12 ENCOUNTER — Ambulatory Visit (INDEPENDENT_AMBULATORY_CARE_PROVIDER_SITE_OTHER): Payer: Medicare Other | Admitting: Family Medicine

## 2021-12-12 ENCOUNTER — Encounter: Payer: Self-pay | Admitting: Family Medicine

## 2021-12-12 VITALS — BP 115/61 | HR 76 | Temp 97.4°F | Ht 63.0 in | Wt 190.0 lb

## 2021-12-12 DIAGNOSIS — E1165 Type 2 diabetes mellitus with hyperglycemia: Secondary | ICD-10-CM

## 2021-12-12 DIAGNOSIS — I10 Essential (primary) hypertension: Secondary | ICD-10-CM

## 2021-12-12 DIAGNOSIS — F411 Generalized anxiety disorder: Secondary | ICD-10-CM | POA: Diagnosis not present

## 2021-12-12 DIAGNOSIS — Z794 Long term (current) use of insulin: Secondary | ICD-10-CM | POA: Diagnosis not present

## 2021-12-12 DIAGNOSIS — J441 Chronic obstructive pulmonary disease with (acute) exacerbation: Secondary | ICD-10-CM | POA: Diagnosis not present

## 2021-12-12 DIAGNOSIS — R110 Nausea: Secondary | ICD-10-CM

## 2021-12-12 MED ORDER — QUETIAPINE FUMARATE 100 MG PO TABS: 100.0000 mg | ORAL_TABLET | Freq: Every day | ORAL | 1 refills | Status: DC

## 2021-12-12 MED ORDER — BUSPIRONE HCL 15 MG PO TABS: 15.0000 mg | ORAL_TABLET | Freq: Two times a day (BID) | ORAL | 1 refills | Status: DC

## 2021-12-12 MED ORDER — OMEPRAZOLE 20 MG PO CPDR: 20.0000 mg | DELAYED_RELEASE_CAPSULE | Freq: Every day | ORAL | 3 refills | Status: DC

## 2021-12-12 MED ORDER — ROSUVASTATIN CALCIUM 5 MG PO TABS: 5.0000 mg | ORAL_TABLET | Freq: Every day | ORAL | 2 refills | Status: AC

## 2021-12-12 NOTE — Progress Notes (Signed)
Subjective:  Patient ID: [name], female    DOB: 07-25-1951  Age: 71 y.o. MRN: [id number]  CC: Medical Management of Chronic Issues   HPI [name] presents for DC from hospital 2 days ago. Feels weak, anxious. Not sleeping well. Up all night. Getting dialysis Tomorrow. Had an exacerbation of CHF & COPD. Breathing better now. Had a low potassium at admission. Back to3.6 by DC. Repeat planned at dialysis tomorrow. Had A1c  also. On 1/3 it was 7.3. Denies lows and high glucose readings.     Depression screen [facility] 2/9 12/12/2021 12/12/2021 09/15/2021  Decreased Interest 0 0 0  Down, Depressed, Hopeless 0 0 0  PHQ - 2 Score 0 0 0  Altered sleeping 2 - 1  Tired, decreased energy 2 - 2  Change in appetite 0 - 0  Feeling bad or failure about yourself  0 - 0  Trouble concentrating 0 - 0  Moving slowly or fidgety/restless 0 - 0  Suicidal thoughts 0 - 0  PHQ-9 Score 4 - 3  Difficult doing work/chores Somewhat difficult - Somewhat difficult  Some recent data might be hidden    History [name] has a past medical history of Acute on chronic diastolic CHF (congestive heart failure) ([place]) (03/05/2015), Anemia, Anxiety, Arthritis, CAD (coronary artery disease), Chronic kidney disease, Chronic neck pain, COPD (chronic obstructive pulmonary disease) (HCC), Degenerative joint disease, Diabetic neuropathy ([place]), Diverticulosis, Esophageal stricture, GERD (gastroesophageal reflux disease), Hyperlipidemia, Hypertension, IDDM (insulin dependent diabetes mellitus), Pericardial effusion (03/06/2015), Peripheral vascular disease ([place]), Stroke ([place]), and Vitamin D deficiency.   She has a past surgical history that includes Bladder surgery; Partial hysterectomy; Shoulder surgery (Right); Cervical spine surgery (2012); Abdominal hysterectomy; Laminectomy (12/29/2011); cardiac stents; Lumbar laminectomy/decompression microdiscectomy (12/29/2011); Back surgery; Cystoscopy with injection (N/A, 06/03/2013); Cardiac  catheterization (2003, 2005, 2009); Cataract extraction w/PHACO (Right, 05/04/2014); Cataract extraction w/PHACO (Left, 07/20/2014); AV fistula placement (Right, 07/20/2015); Cardiac catheterization (N/A, 11/22/2015); Insertion of dialysis catheter (N/A, 11/26/2015); AV fistula placement (Right, 11/26/2015); Fistulogram (Right, 10/20/2016); A/V Fistulagram (N/A, 08/10/2017); PERIPHERAL VASCULAR INTERVENTION (08/10/2017); A/V Fistulagram (N/A, 03/03/2020); PERIPHERAL VASCULAR BALLOON ANGIOPLASTY (03/03/2020); and PERIPHERAL VASCULAR BALLOON ANGIOPLASTY (Right, 04/15/2021).   Her family history includes Cancer in her father; Diabetes in her daughter; Heart disease in an other family member; Hypertension in her daughter, daughter, father, and mother; Pancreatitis in her brother.She reports that she quit smoking about 20 years ago. Her smoking use included cigarettes. She has a 30.00 pack-year smoking history. She has never used smokeless tobacco. She reports that she does not drink alcohol and does not use drugs.    ROS Review of Systems  Constitutional: Negative.   HENT: Negative.    Eyes:  Negative for visual disturbance.  Respiratory:  Negative for shortness of breath.   Cardiovascular:  Negative for chest pain.  Gastrointestinal:  Negative for abdominal pain.  Musculoskeletal:  Negative for arthralgias.   Objective:  BP 115/61    Pulse 76    Temp (!) 97.4 F (36.3 C)    Ht 5\' 3"  (1.6 m)    Wt 190 lb (86.2 kg)    SpO2 95%    BMI 33.66 kg/m   BP Readings from Last 3 Encounters:  12/12/21 115/61  12/10/21 (!) 141/78  12/11/21 129/75    Wt Readings from Last 3 Encounters:  12/12/21 190 lb (86.2 kg)  12/10/21 185 lb 3 oz (84 kg)  11/09/21 197 lb (89.4 kg)     Physical Exam Constitutional:  General: She is not in acute distress.    Appearance: She is well-developed.  Cardiovascular:     Rate and Rhythm: Normal rate and regular rhythm.  Pulmonary:     Breath sounds: Normal breath sounds.   Abdominal:     Palpations: Abdomen is soft.     Tenderness: There is no abdominal tenderness.  Musculoskeletal:        General: Normal range of motion.  Skin:    General: Skin is warm and dry.  Neurological:     Mental Status: She is alert and oriented to person, place, and time.      Assessment & Plan:   [name] was seen today for medical management of chronic issues.  Diagnoses and all orders for this visit:  Chronic obstructive pulmonary disease with acute exacerbation (HCC)  Nausea -     omeprazole (PRILOSEC) 20 MG capsule; Take 1 capsule (20 mg total) by mouth daily.  GAD (generalized anxiety disorder) -     QUEtiapine (SEROQUEL) 100 MG tablet; Take 1 tablet (100 mg total) by mouth at bedtime. For sleep and for anxiety -     busPIRone (BUSPAR) 15 MG tablet; Take 1 tablet (15 mg total) by mouth 2 (two) times daily.  Type 2 diabetes mellitus with hyperglycemia, with long-term current use of insulin (HCC)  Essential hypertension  Other orders -     rosuvastatin (CRESTOR) 5 MG tablet; Take 1 tablet (5 mg total) by mouth daily. For cholesterol       I have discontinued [name]'s DULoxetine. I have also changed her QUEtiapine and busPIRone. Additionally, I am having her maintain her aspirin, calcium acetate, acetaminophen, Carboxymethylcellul-Glycerin (LUBRICATING EYE DROPS OP), OneTouch Ultra, gabapentin, montelukast, atenolol, cetirizine, rOPINIRole, FreeStyle Libre 2 Sensor, Ozempic (0.25 or 0.5 MG/DOSE), nitroGLYCERIN, NIFEdipine, ondansetron, FreeStyle Libre 2 Reader, budesonide-formoterol, Basaglar KwikPen, albuterol, albuterol, predniSONE, omeprazole, and rosuvastatin.  Allergies as of 12/12/2021       Reactions   Azithromycin    Prolonged QT   Ace Inhibitors Cough   Clopidogrel Bisulfate Other (See Comments)   Sick, Headache, "Felt terrible"   Codeine Other (See Comments)   hallucinations   Lisinopril Cough   Penicillins Other (See Comments)    Unknown reaction Did it involve swelling of the face/tongue/throat, SOB, or low BP? Unknown Did it involve sudden or severe rash/hives, skin peeling, or any reaction on the inside of your mouth or nose? Unknown Did you need to seek medical attention at a hospital or doctor's office? Unknown When did it last happen?      childhood allergy If all above answers are "NO", may proceed with cephalosporin use.        Medication List        Accurate as of December 12, 2021  8:19 PM. If you have any questions, ask your nurse or doctor.          STOP taking these medications    DULoxetine 20 MG capsule Commonly known as: CYMBALTA Stopped by: [name], MD       TAKE these medications    acetaminophen 500 MG tablet Commonly known as: TYLENOL Take 1,000 mg by mouth every 6 (six) hours as needed for moderate pain or headache.   albuterol 108 (90 Base) MCG/ACT inhaler Commonly known as: VENTOLIN HFA Inhale 2 puffs into the lungs every 6 (six) hours as needed for wheezing or shortness of breath.   albuterol (2.5 MG/3ML) 0.083% nebulizer solution Commonly known as: PROVENTIL Take 3  mLs (2.5 mg total) by nebulization every 6 (six) hours as needed for wheezing or shortness of breath.   aspirin 81 MG EC tablet Take 81 mg by mouth daily.   atenolol 50 MG tablet Commonly known as: TENORMIN Take 1 tablet (50 mg total) by mouth 2 (two) times daily.   Basaglar KwikPen 100 UNIT/ML Inject 25 Units into the skin at bedtime. Add five units each time the fasting is over 125 three days in a row.   budesonide-formoterol 160-4.5 MCG/ACT inhaler Commonly known as: SYMBICORT Inhale 2 puffs into the lungs 2 (two) times daily.   busPIRone 15 MG tablet Commonly known as: BUSPAR Take 1 tablet (15 mg total) by mouth 2 (two) times daily. What changed:  medication strength how much to take when to take this reasons to take this Changed by: [name], MD   calcium acetate 667 MG  capsule Commonly known as: PHOSLO Take [phone] mg by mouth See admin instructions. Take 1334 mg with meals 3 times daily and 667 mg with snacks   cetirizine 10 MG tablet Commonly known as: ZYRTEC Take 1 tablet (10 mg total) by mouth daily as needed for allergies.   FreeStyle Libre 2 Reader Kerrin Mo USE TO TEST BLOOD SUGAR CONTINUOUSLY   FreeStyle Libre 2 Sensor Misc Use multiple times daily to track glucose to prevent highs and lows as a complication of dialysis Dx:Z99.2, E11.59   gabapentin 400 MG capsule Commonly known as: NEURONTIN Take 1 capsule (400 mg total) by mouth See admin instructions. Take it after HD   LUBRICATING EYE DROPS OP Place 1 drop into both eyes daily as needed (dry eyes).   montelukast 10 MG tablet Commonly known as: SINGULAIR Take 10 mg by mouth at bedtime.   NIFEdipine 60 MG 24 hr tablet Commonly known as: PROCARDIA XL/NIFEDICAL XL TAKE 1 TABLET BY MOUTH ONCE DAILY (ON  NON-DIALYSIS  DAYS)   nitroGLYCERIN 0.4 MG SL tablet Commonly known as: NITROSTAT Place 1 tablet (0.4 mg total) under the tongue every 5 (five) minutes as needed for chest pain.   omeprazole 20 MG capsule Commonly known as: PRILOSEC Take 1 capsule (20 mg total) by mouth daily.   ondansetron 4 MG tablet Commonly known as: ZOFRAN TAKE 1 TABLET BY MOUTH EVERY 8 HOURS AS NEEDED FOR NAUSEA AND VOMITING   OneTouch Ultra test strip Generic drug: glucose blood Use to test blood sugar 3 times daily as directed. DX: E11.9   Ozempic (0.25 or 0.5 MG/DOSE) 2 MG/1.5ML Sopn Generic drug: Semaglutide(0.25 or 0.5MG /DOS) Inject 0.25 mg into the skin once a week. Friday   predniSONE 20 MG tablet Commonly known as: DELTASONE Take 1 tablet (20 mg total) by mouth daily for 2 days.   QUEtiapine 100 MG tablet Commonly known as: SEROQUEL Take 1 tablet (100 mg total) by mouth at bedtime. For sleep and for anxiety What changed:  medication strength how much to take Changed by: [name], MD    rOPINIRole 1 MG tablet Commonly known as: REQUIP Take 1 tablet (1 mg total) by mouth at bedtime. For leg cramps   rosuvastatin 5 MG tablet Commonly known as: Crestor Take 1 tablet (5 mg total) by mouth daily. For cholesterol         Follow-up: Return in about 3 months (around 03/12/2022).  [name], M.D.

## 2021-12-13 ENCOUNTER — Ambulatory Visit: Payer: Medicare Other | Admitting: Pharmacist

## 2021-12-13 DIAGNOSIS — Z992 Dependence on renal dialysis: Secondary | ICD-10-CM | POA: Diagnosis not present

## 2021-12-13 DIAGNOSIS — N186 End stage renal disease: Secondary | ICD-10-CM | POA: Diagnosis not present

## 2021-12-13 DIAGNOSIS — N2581 Secondary hyperparathyroidism of renal origin: Secondary | ICD-10-CM | POA: Diagnosis not present

## 2021-12-13 DIAGNOSIS — D509 Iron deficiency anemia, unspecified: Secondary | ICD-10-CM | POA: Diagnosis not present

## 2021-12-13 DIAGNOSIS — D631 Anemia in chronic kidney disease: Secondary | ICD-10-CM | POA: Diagnosis not present

## 2021-12-14 ENCOUNTER — Ambulatory Visit: Payer: Medicare Other | Admitting: Pharmacist

## 2021-12-14 ENCOUNTER — Telehealth: Payer: Self-pay | Admitting: Family Medicine

## 2021-12-14 DIAGNOSIS — E1165 Type 2 diabetes mellitus with hyperglycemia: Secondary | ICD-10-CM

## 2021-12-14 DIAGNOSIS — E782 Mixed hyperlipidemia: Secondary | ICD-10-CM

## 2021-12-14 NOTE — Patient Instructions (Addendum)
Visit Information  Thank you for taking time to visit with me today. Please don't hesitate to contact me if I can be of assistance to you before our next scheduled telephone appointment.  Following are the goals we discussed today:  Current Barriers:  Unable to independently afford treatment regimen Unable to maintain control of T2DM, HLD Suboptimal therapeutic regimen for T2DM  Pharmacist Clinical Goal(s):  patient will verbalize ability to afford treatment regimen achieve control of T2DM as evidenced by GOAL A1C, IMPROVED BLOOD SUGARS WITH STEADY CONTROL through collaboration with PharmD and provider.   Interventions: 1:1 collaboration with [name], MD regarding development and update of comprehensive plan of care as evidenced by provider attestation and co-signature Inter-disciplinary care team collaboration (see longitudinal plan of care) Comprehensive medication review performed; medication list updated in electronic medical record  Diabetes: Goal on Track (progressing): YES. Uncontrolled--A1C 7.9%; current treatment: BASAGLAR 28 UNITS, Ozempic 0.25mg  SQ WEEKLY INTOLERANCES:  DID NOT TOLERATE TRULICITY WELL DUE TO GI (EVEN AT LOWEST DOSE) T2DM REGIMEN LIMITED DUE TO HD TIW CONTINUE OZEMPIC TO 0.25MG  SQ WEEKLY (will go up by 5 clicks and work towards the 0.5mg  dose) Denies personal and family history of Medullary thyroid cancer (MTC) Sample given TOLERATING LOW DOSE OZEMPIC; OCCASIONALLY USES ZOFRAN FOR NAUSEA, BUT HAS OTHER CAUSES OF NAUSEA AS WELL Application submitted to [place] for ozempic patient assistance on 12/06/21 (sample given today) LIBRE 2 COVERED UNDER INSURANCE FOR $0 COPAY RE-SUBMITTED TO [facility]IA [place] [employer] TO CHECK ON COVERAGE--[facility] and [place] fulfilled libre order (need to d/c one of them)-->will follow up on on 12/22/21 Libre 2 CGM education provided Patient to use phone as reader--set up [facility] 2 app Left arm (since graft/HD on  right side) Patient RE-enrolled in [employer] patient assistance program for free basgalar (med ships to PCP Office due to patient's HD schedule) Current glucose readings: fasting glucose: <155, post prandial glucose: n/a Reports hyperglycemic symptoms Must follow dialysis diet Current exercise: n/a; HD TIW Recommended continue low dose ozempic; new libre start--patient coming home from the hospital, therefore Elenor Legato was removed--additional sample provided--I must clarify with company which one to proceed with (only one can fill her [place]) Assessed patient finances. Submitted for Ozempic PATIENT ASSISTANCE via [employer]; APPLICATION SUBMITTED FOR SYMBICORT TO [facility] PATIENT ASSISTANCE--PATIENT DID NOT LIKE BREZTRI (received symbicort shipment at our office on 12/2021)  Hyperlipidemia:  Condition stable. Not addressed this visit. Uncontrolled; current treatment:CRESTOR (ROSUVASTATIN 5MG  DAILY);  Current dietary patterns: ON HD 3X WEEKLY; NOT HUGE APPETITE, HOWEVER DOES ENJOY SWEETS, OZEMPIC TO ASSIST Recommended HEART HEALTHY DIET, TAKE STATIN AS PRESCRIBED, NEW START OZEMPIC SHOULD LIKELY HELP WITH LIPID PANEL Lipid Panel     Component Value Date/Time   CHOL 203 (H) 01/31/2021 1001   CHOL 241 (H) 03/25/2013 1026   TRIG 240 (H) 01/31/2021 1001   TRIG 147 10/09/2013 1527   TRIG 209 (H) 03/25/2013 1026   HDL 36 (L) 01/31/2021 1001   HDL 65 10/09/2013 1527   HDL 51 03/25/2013 1026   CHOLHDL 5.6 (H) 01/31/2021 1001   CHOLHDL 3.0 06/17/2008 0625   VLDL 19 06/17/2008 0625   LDLCALC 124 (H) 01/31/2021 1001   LDLCALC 125 (H) 10/09/2013 1527   LDLCALC 148 (H) 03/25/2013 1026   LABVLDL 43 (H) 01/31/2021 1001   Patient Goals/Self-Care Activities patient will:  - take medications as prescribed as evidenced by patient report and record review check glucose DAILY OR IF SYMPTOMATIC, document, and provide at future  appointments engage in dietary modifications by HEART HEALTHY/dialysis  DIET  FOLLOW UP APPT ON 12/28/21 FOR LIBRE CHANGE  Please call the care guide team at [phone] if you need to cancel or reschedule your appointment.   The patient verbalized understanding of instructions, educational materials, and care plan provided today and agreed to receive a mailed copy of patient instructions, educational materials, and care plan.   Regina Eck, PharmD, BCPS Clinical Pharmacist, [place]  II Phone [phone]

## 2021-12-14 NOTE — Progress Notes (Signed)
Chronic Care Management Pharmacy Note  12/14/2021 Name:  [name] MRN:  [id number] DOB:  [birth year]  Summary: T2DM  Recommendations/Changes made from today's visit: Diabetes: Goal on Track (progressing): YES. Uncontrolled--A1C 7.9%; current treatment: BASAGLAR 28 UNITS, Ozempic 0.23m SQ WEEKLY INTOLERANCES:  DID NOT TOLERATE TRULICITY WELL DUE TO GI (EVEN AT LOWEST DOSE) T2DM REGIMEN LIMITED DUE TO HD TIW CONTINUE OZEMPIC TO 0.25MG SQ WEEKLY (will go up by 5 clicks and work towards the 0.527mdose) Denies personal and family history of Medullary thyroid cancer (MTC) Sample given TOLERATING LOW DOSE OZEMPIC; OCCASIONALLY USES ZOFRAN FOR NAUSEA, BUT HAS OTHER CAUSES OF NAUSEA AS WELL Application submitted [place] ozempic patient assistance on 12/06/21 (sample given today) LIBRE 2 COVERED UNDER INSURANCE FOR $0 COPAY RE-SUBMITTED TO [facility] [facility] [employer]IA [employer] PORTAL TO CHECK ON COVERAGE--ADS and TMHolloman AFBulfilled libre order (need to d/c one of them)-->will follow up on on 12/22/21 Libre 2 CGM education provided Patient to use phone as reader--set up li[place] app Left arm (since graft/HD on right side) Patient RE-enrolled in LiAssurantatient assistance program for free basgalar (med ships to PCP Office due to patient's HD schedule) Current glucose readings: fasting glucose: <155, post prandial glucose: n/a Reports hyperglycemic symptoms Must follow dialysis diet Current exercise: n/a; HD TIW Recommended continue low dose ozempic; new libre start--patient coming home from the hospital, therefore liElenor Legatoas removed--additional sample provided--I must clarify with company which one to proceed with (only one can fill her liUzbekistanAssessed patient finances. Submitted for Ozempic PATIENT ASSISTANCE via novo nordisk; APPLICATION SUBMITTED FOR SYMBICORT TO az&ME PATIENT ASSISTANCE--PATIENT DID NOT LIKE BREZTRI (received symbicort shipment at our office on  12/2021)  Hyperlipidemia:  Condition stable. Not addressed this visit. Uncontrolled; current treatment:CRESTOR (ROSUVASTATIN 5MG DAILY);  Current dietary patterns: ON HD 3X WEEKLY; NOT HUGE APPETITE, HOWEVER DOES ENJOY SWEETS, OZEMPIC TO ASSIST Recommended HEART HEALTHY DIET, TAKE STATIN AS PRESCRIBED, NEW START OZEMPIC SHOULD LIKELY HELP WITH LIPID PANEL Lipid Panel     Component Value Date/Time   CHOL 203 (H) 01/31/2021 1001   CHOL 241 (H) 03/25/2013 1026   TRIG 240 (H) 01/31/2021 1001   TRIG 147 10/09/2013 1527   TRIG 209 (H) 03/25/2013 1026   HDL 36 (L) 01/31/2021 1001   HDL 65 10/09/2013 1527   HDL 51 03/25/2013 1026   CHOLHDL 5.6 (H) 01/31/2021 1001   CHOLHDL 3.0 06/17/2008 0625   VLDL 19 06/17/2008 0625   LDLCALC 124 (H) 01/31/2021 1001   LDLCALC 125 (H) 10/09/2013 1527   LDLCALC 148 (H) 03/25/2013 1026   LABVLDL 43 (H) 01/31/2021 1001   Patient Goals/Self-Care Activities patient will:  - take medications as prescribed as evidenced by patient report and record review check glucose DAILY OR IF SYMPTOMATIC, document, and provide at future appointments engage in dietary modifications by HEART HEALTHY/dialysis DIET  Plan: F/U IN 1 MONTH  Subjective: [name] an 89.o. year old female who is a primary patient of [name], WaCletus GashMD.  The CCM team was consulted for assistance with disease management and care coordination needs.    Engaged with patient face to face for follow up visit in response to provider referral for pharmacy case management and/or care coordination services.   Consent to Services:  The patient was given information about Chronic Care Management services, agreed to services, and gave verbal consent prior to initiation of services.  Please see initial visit note for detailed documentation.  Patient Care Team: [name], MD as PCP - General (Family Medicine) Minus Breeding, MD as PCP - Cardiology (Cardiology) Fran Lowes, MD  (Inactive) as Consulting Physician (Nephrology) [name], MD as Referring Physician (Family Medicine) Minus Breeding, MD as Consulting Physician (Cardiology) [place], [facility], Delice Bison, RN as Case Manager Lavera Guise, [facility] (Pharmacist) [place], [facility]  Objective:  Lab Results  Component Value Date   CREATININE 6.35 (H) 12/08/2021   CREATININE 7.49 (H) 12/07/2021   CREATININE 8.06 (H) 12/07/2021    Lab Results  Component Value Date   HGBA1C 7.3 (H) 12/06/2021   Last diabetic Eye exam:  Lab Results  Component Value Date/Time   HMDIABEYEEXA No Retinopathy 11/02/2021 12:00 AM    Last diabetic Foot exam: No results found for: HMDIABFOOTEX      Component Value Date/Time   CHOL 203 (H) 01/31/2021 1001   CHOL 241 (H) 03/25/2013 1026   TRIG 240 (H) 01/31/2021 1001   TRIG 147 10/09/2013 1527   TRIG 209 (H) 03/25/2013 1026   HDL 36 (L) 01/31/2021 1001   HDL 65 10/09/2013 1527   HDL 51 03/25/2013 1026   CHOLHDL 5.6 (H) 01/31/2021 1001   CHOLHDL 3.0 06/17/2008 0625   VLDL 19 06/17/2008 0625   LDLCALC 124 (H) 01/31/2021 1001   LDLCALC 125 (H) 10/09/2013 1527   LDLCALC 148 (H) 03/25/2013 1026    Hepatic Function Latest Ref Rng & Units 12/07/2021 12/07/2021 02/19/2021  Total Protein 6.5 - 8.1 g/dL - 6.2(L) 6.8  Albumin 3.5 - 5.0 g/dL 3.4(L) 3.2(L) 3.3(L)  AST 15 - 41 U/L - 12(L) 17  ALT 0 - 44 U/L - 13 21  Alk Phosphatase 38 - 126 U/L - 62 87  Total Bilirubin 0.3 - 1.2 mg/dL - 0.3 0.2(L)  Bilirubin, Direct 0.0 - 0.2 mg/dL - - <0.1    Lab Results  Component Value Date/Time   TSH 2.450 03/07/2021 12:32 PM   TSH 2.261 03/05/2015 08:48 AM    CBC Latest Ref Rng & Units 12/08/2021 12/07/2021 12/07/2021  WBC 4.0 - 10.5 K/uL 10.7(H) 12.3(H) 12.8(H)  Hemoglobin 12.0 - 15.0 g/dL 9.3(L) 9.4(L) 9.4(L)  Hematocrit 36.0 - 46.0 % 30.3(L) 30.2(L) 29.6(L)  Platelets 150 - 400 K/uL 180 192 193    Lab Results  Component  Value Date/Time   VD25OH 26.1 (L) 10/09/2013 03:27 PM   VD25OH 36 03/25/2013 10:26 AM    Clinical ASCVD: No  The ASCVD Risk score (Arnett DK, et al., 2019) failed to calculate for the following reasons:   The valid total cholesterol range is 130 to 320 mg/dL    Other: (CHADS2VASc if Afib, PHQ9 if depression, MMRC or CAT for COPD, ACT, DEXA)  Social History   Tobacco Use  Smoking Status Former   Packs/day: 1.00   Years: 30.00   Pack years: 30.00   Types: Cigarettes   Quit date: 12/04/2001   Years since quitting: 20.0  Smokeless Tobacco Never   BP Readings from Last 3 Encounters:  12/12/21 115/61  12/10/21 (!) 141/78  12/11/21 129/75   Pulse Readings from Last 3 Encounters:  12/12/21 76  12/10/21 65  11/09/21 68   Wt Readings from Last 3 Encounters:  12/12/21 190 lb (86.2 kg)  12/10/21 185 lb 3 oz (84 kg)  11/09/21 197 lb (89.4 kg)    Assessment: Review of patient past medical history, allergies, medications, health status, including review of consultants reports, laboratory and other  test data, was performed as part of comprehensive evaluation and provision of chronic care management services.   SDOH:  (Social Determinants of Health) assessments and interventions performed:    CCM Care Plan  Allergies  Allergen Reactions   Azithromycin     Prolonged QT   Ace Inhibitors Cough   Clopidogrel Bisulfate Other (See Comments)    Sick, Headache, "Felt terrible"   Codeine Other (See Comments)    hallucinations   Lisinopril Cough   Penicillins Other (See Comments)    Unknown reaction Did it involve swelling of the face/tongue/throat, SOB, or low BP? Unknown Did it involve sudden or severe rash/hives, skin peeling, or any reaction on the inside of your mouth or nose? Unknown Did you need to seek medical attention at a hospital or doctor's office? Unknown When did it last happen?      childhood allergy If all above answers are "NO", may proceed with cephalosporin  use.      Medications Reviewed Today     Reviewed by Lavera Guise, [facility] (Pharmacist) on 12/14/21 at 56  Med List Status: <None>   Medication Order Taking? Sig Documenting Provider Last Dose Status Informant  acetaminophen (TYLENOL) 500 MG tablet [id number] No Take 1,000 mg by mouth every 6 (six) hours as needed for moderate pain or headache. [provider] Taking Active Self  albuterol (PROVENTIL) (2.5 MG/3ML) 0.083% nebulizer solution [id number] No Take 3 mLs (2.5 mg total) by nebulization every 6 (six) hours as needed for wheezing or shortness of breath. [name], MD Taking Active Self  albuterol (VENTOLIN HFA) 108 (90 Base) MCG/ACT inhaler [id number] No Inhale 2 puffs into the lungs every 6 (six) hours as needed for wheezing or shortness of breath. [name], MD Taking Active Self  aspirin 81 MG EC tablet [id number] No Take 81 mg by mouth daily. [provider] Taking Active Self  atenolol (TENORMIN) 50 MG tablet [id number] No Take 1 tablet (50 mg total) by mouth 2 (two) times daily. [name], MD Taking Active Self  budesonide-formoterol [facility]) 160-4.5 MCG/ACT inhaler [id number] No Inhale 2 puffs into the lungs 2 (two) times daily. [name], MD Taking Active Self           Med Note Blanca Friend, Omer Jack Dec 11, 2021 11:34 PM) Via az&me patient assistance program--escribe to [facility] mail order pharm  busPIRone (BUSPAR) 15 MG tablet [id number]  Take 1 tablet (15 mg total) by mouth 2 (two) times daily. [name], MD  Active   calcium acetate (PHOSLO) 667 MG capsule [id number] No Take 667-1,334 mg by mouth See admin instructions. Take 1334 mg with meals 3 times daily and 667 mg with snacks [provider] Taking Active Self  Carboxymethylcellul-Glycerin (LUBRICATING EYE DROPS OP) [id number] No Place 1 drop into both eyes daily as needed (dry eyes). [provider] Taking Active Self  cetirizine (ZYRTEC) 10 MG tablet [id number] No  Take 1 tablet (10 mg total) by mouth daily as needed for allergies. [name], MD Taking Active Self  Continuous Blood Gluc Receiver (FREESTYLE LIBRE 2 READER) DEVI [id number] No USE TO TEST BLOOD SUGAR Liborio Nixon, MD Taking Active Self  Continuous Blood Gluc Sensor (FREESTYLE LIBRE 2 SENSOR) MISC [id number] No Use multiple times daily to track glucose to prevent highs and lows as a complication of dialysis Dx:Z99.2, E11.59 [name], MD Taking Active Self           Med Note (Kila Godina D  Tue Nov 15, 2021 11:39 AM) [employer] [facility] [facility]IA [employer] PORTAL ON 11/15/21  gabapentin (NEURONTIN) 400 MG capsule [id number] No Take 1 capsule (400 mg total) by mouth See admin instructions. Take it after HD Barton Dubois, MD Taking Active Self  glucose blood (ONETOUCH ULTRA) test strip [id number] No Use to test blood sugar 3 times daily as directed. DX: E11.9 [name], MD Taking Active Self  Insulin Glargine [facility] KWIKPEN) 100 UNIT/ML [id number] No Inject 25 Units into the skin at bedtime. Add five units each time the fasting is over 125 three days in a row. [name], MD Taking Active Self           Med Note Lavera Guise   Tue Nov 15, 2021 11:45 AM) VIA LILLY CARES PATIENT ASSISTANCE PROGRAM--ESCRIBE CHANGES OR REFILLS TO [facility] [place] PER PROGRAM   montelukast (SINGULAIR) 10 MG tablet [id number] No Take 10 mg by mouth at bedtime. [provider] Taking Active Self  NIFEdipine (PROCARDIA XL/NIFEDICAL XL) 60 MG 24 hr tablet [id number] No TAKE 1 TABLET BY MOUTH ONCE DAILY (ON  NON-DIALYSIS  DAYS) Minus Breeding, MD Taking Active Self  nitroGLYCERIN (NITROSTAT) 0.4 MG SL tablet [id number] No Place 1 tablet (0.4 mg total) under the tongue every 5 (five) minutes as needed for chest pain. Minus Breeding, MD Taking Active Self  omeprazole (PRILOSEC) 20 MG capsule [id number]  Take 1 capsule (20 mg total) by mouth daily. [name], MD   Active   ondansetron (ZOFRAN) 4 MG tablet [id number] No TAKE 1 TABLET BY MOUTH EVERY 8 HOURS AS NEEDED FOR NAUSEA AND VOMITING [name], MD Taking Active Self  QUEtiapine (SEROQUEL) 100 MG tablet [id number]  Take 1 tablet (100 mg total) by mouth at bedtime. For sleep and for anxiety [name], MD  Active   rOPINIRole (REQUIP) 1 MG tablet [id number] No Take 1 tablet (1 mg total) by mouth at bedtime. For leg cramps [name], MD Taking Active Self  rosuvastatin (CRESTOR) 5 MG tablet [id number]  Take 1 tablet (5 mg total) by mouth daily. For cholesterol [name], MD  Active   Semaglutide,0.25 or 0.5MG/DOS, (OZEMPIC, 0.25 OR 0.5 MG/DOSE,) 2 MG/1.5ML SOPN [id number] No Inject 0.25 mg into the skin once a week. Friday [provider] Taking Active Self           Med Note Lavera Guise   Fri Sep 30, 2021  2:35 PM) Sample given            Patient Active Problem List   Diagnosis Date Noted   Acute CHF (congestive heart failure) ([place]) 12/06/2021   Leukocytosis 12/06/2021   Elevated brain natriuretic peptide (BNP) level 12/06/2021   Elevated troponin 12/06/2021   GERD (gastroesophageal reflux disease) 12/06/2021   Nausea 08/26/2021   Chest pain 02/19/2021   Essential hypertension    Mixed hyperlipidemia    Type 2 diabetes mellitus with hyperglycemia ([place])    Hypoalbuminemia    Bruit 01/05/2020   Educated about COVID-19 virus infection 01/05/2020   End stage renal disease on dialysis due to type 2 diabetes mellitus ([place]) 07/21/2019   Lung nodule 05/01/2018   Prolonged Q-T interval on ECG 09/06/2017   Coronary artery disease involving native coronary artery of native heart without angina pectoris 09/06/2017   GAD (generalized anxiety disorder) 03/04/2016   Leg pain 01/07/2016   Anemia of chronic disease 12/10/2015   Dependence on renal dialysis ([place]) 12/10/2015   ESRD (end stage renal disease)  on dialysis ([place]) 12/10/2015   Osteopathy in diseases classified  elsewhere, unspecified site 12/10/2015   Constipation 10/25/2015   COPD (chronic obstructive pulmonary disease) ([place]) 03/06/2015   Pericardial effusion 03/06/2015   Chronic diastolic CHF (congestive heart failure) ([place]) 03/05/2015   PAD (peripheral artery disease) ([place]) 04/21/2014   Dyspnea 09/29/2013   Obesity (BMI 30.0-34.9) 12/05/2011   Diverticulosis 03/29/2011   Diabetic neuropathy ([place]) 03/29/2011   Esophageal stricture 03/29/2011   Vitamin D deficiency 03/29/2011   Degenerative joint disease of left shoulder 03/29/2011   Neck pain 03/29/2011   Type II diabetes mellitus with nephropathy ([place]) 04/19/2009   Hyperlipidemia associated with type 2 diabetes mellitus ([place]) 04/19/2009   Hypertension associated with diabetes ([place]) 04/19/2009   Coronary atherosclerosis 04/19/2009    Immunization History  Administered Date(s) Administered   DTaP 07/19/2010   Hepatitis B, adult 12/04/2015, 01/04/2016, 02/08/2016, 06/01/2016, 10/17/2016, 11/11/2016, 12/16/2016, 04/12/2017, 04/08/2019   Influenza Split 10/04/2013, 09/14/2015   Influenza Whole 09/03/2006   Influenza, Seasonal, Injecte, Preservative Fre 08/24/2011, 09/25/2012   Influenza,inj,Quad PF,6+ Mos 09/11/2013, 09/09/2014   Influenza-Unspecified 09/14/2015, 08/25/2016, 08/21/2019, 09/09/2020, 09/15/2021   Moderna SARS-COV2 Booster Vaccination 11/03/2020, 09/30/2021   Moderna Sars-Covid-2 Vaccination 02/09/2020, 03/11/2020   PPD Test 11/23/2015, 12/26/2016, 01/03/2018, 01/09/2019, 04/20/2020, 03/31/2021   Pneumococcal Conjugate-13 07/07/2017   Pneumococcal Polysaccharide-23 08/26/2012, 07/07/2017, 12/13/2018   Pneumococcal-Unspecified 08/26/2012, 07/07/2017   Td 07/19/2010   Tdap 08/24/2011   Zoster Recombinat (Shingrix) 09/09/2019, 11/10/2019    Conditions to be addressed/monitored: HLD, DMII, and ESRD  Care Plan : PHARMD MEDICATION MANAGEMENT  Updates made by Lavera Guise, RPH since 12/16/2021 12:00 AM     Problem:  DISEASE PROGRESSION PREVENTION      Long-Range Goal: T2DM, HLD   Recent Progress: Not on track  Priority: High  Note:   Current Barriers:  Unable to independently afford treatment regimen Unable to maintain control of T2DM, HLD Suboptimal therapeutic regimen for T2DM  Pharmacist Clinical Goal(s):  patient will verbalize ability to afford treatment regimen achieve control of T2DM as evidenced by GOAL A1C, IMPROVED BLOOD SUGARS WITH STEADY CONTROL  through collaboration with PharmD and provider.   Interventions: 1:1 collaboration with [name], MD regarding development and update of comprehensive plan of care as evidenced by provider attestation and co-signature Inter-disciplinary care team collaboration (see longitudinal plan of care) Comprehensive medication review performed; medication list updated in electronic medical record  Diabetes: Goal on Track (progressing): YES. Uncontrolled--A1C 7.9%; current treatment: BASAGLAR 28 UNITS, Ozempic 0.81m SQ WEEKLY INTOLERANCES:  DID NOT TOLERATE TRULICITY WELL DUE TO GI (EVEN AT LOWEST DOSE) T2DM REGIMEN LIMITED DUE TO HD TIW CONTINUE OZEMPIC TO 0.25MG SQ WEEKLY (will go up by 5 clicks and work towards the 0.56mdose) Denies personal and family history of Medullary thyroid cancer (MTC) Sample given TOLERATING LOW DOSE OZEMPIC; OCCASIONALLY USES ZOFRAN FOR NAUSEA, BUT HAS OTHER CAUSES OF NAUSEA AS WELL Application submitted to [place] ozempic patient assistance on 12/06/21 (sample given today) LIBRE 2 COVERED UNDER INSURANCE FOR $0 COPAY RE-SUBMITTED TO [employer]IA [employer] PORT[employer] TO CHECK ON COVERAGE--ADS and TMTempleulfilled libre order (need to d/c one of them)-->will follow up on on 12/22/21 Libre 2 CGM education provided Patient to use phone as reader--set up li[place] app Left arm (since graft/HD on right side) Patient RE-enrolled in LiAssurantatient assistance program for free basgalar (med ships to PCP  Office due to patient's HD schedule) Current glucose readings: fasting glucose: <155, post prandial  glucose: n/a Reports hyperglycemic symptoms Must follow dialysis diet Current exercise: n/a; HD TIW Recommended continue low dose ozempic; new libre start--patient coming home from the hospital, therefore Elenor [place] was removed--additional sample provided--I must clarify with company which one to proceed with (only one can fill her [place]) Assessed patient finances. Submitted for Ozempic PATIENT ASSISTANCE via novo nordisk; APPLICATION SUBMITTED FOR SYMBICORT TO az&ME PATIENT ASSISTANCE--PATIENT DID NOT LIKE BREZTRI  (received symbicort shipment at our office on 12/2021)  Hyperlipidemia:  Condition stable. Not addressed this visit. Uncontrolled; current treatment:CRESTOR (ROSUVASTATIN 5MG DAILY);  Current dietary patterns: ON HD 3X WEEKLY; NOT HUGE APPETITE, HOWEVER DOES ENJOY SWEETS, OZEMPIC TO ASSIST Recommended HEART HEALTHY DIET, TAKE STATIN AS PRESCRIBED, NEW START OZEMPIC SHOULD LIKELY HELP WITH LIPID PANEL Lipid Panel     Component Value Date/Time   CHOL 203 (H) 01/31/2021 1001   CHOL 241 (H) 03/25/2013 1026   TRIG 240 (H) 01/31/2021 1001   TRIG 147 10/09/2013 1527   TRIG 209 (H) 03/25/2013 1026   HDL 36 (L) 01/31/2021 1001   HDL 65 10/09/2013 1527   HDL 51 03/25/2013 1026   CHOLHDL 5.6 (H) 01/31/2021 1001   CHOLHDL 3.0 06/17/2008 0625   VLDL 19 06/17/2008 0625   LDLCALC 124 (H) 01/31/2021 1001   LDLCALC 125 (H) 10/09/2013 1527   LDLCALC 148 (H) 03/25/2013 1026   LABVLDL 43 (H) 01/31/2021 1001  Patient Goals/Self-Care Activities patient will:  - take medications as prescribed as evidenced by patient report and record review check glucose DAILY OR IF SYMPTOMATIC, document, and provide at future appointments engage in dietary modifications by HEART HEALTHY/dialysis DIET      Medication Assistance: Application for OZEMPIC/NOVO [place]  medication assistance program. in process.   Anticipated assistance start date TBD.  See plan of care for additional detail.  Patient's preferred pharmacy is:  [facility] [address], [place] [address] [place] [address] Phone: [phone] Fax: [phone]  [facility] [place], [place] [address] [place] [address] Phone: [phone] Fax: [phone]  [facility] [place], [place] - [address] [place] [address] Phone: [phone] Fax: [phone]  Follow Up:  Patient agrees to Care Plan and Follow-up.  Plan: Telephone follow up appointment with care management team member scheduled for:  12/28/21   Regina Eck, PharmD, BCPS Clinical Pharmacist, [place]  II Phone [phone]

## 2021-12-15 DIAGNOSIS — D509 Iron deficiency anemia, unspecified: Secondary | ICD-10-CM | POA: Diagnosis not present

## 2021-12-15 DIAGNOSIS — N2581 Secondary hyperparathyroidism of renal origin: Secondary | ICD-10-CM | POA: Diagnosis not present

## 2021-12-15 DIAGNOSIS — D631 Anemia in chronic kidney disease: Secondary | ICD-10-CM | POA: Diagnosis not present

## 2021-12-15 DIAGNOSIS — Z992 Dependence on renal dialysis: Secondary | ICD-10-CM | POA: Diagnosis not present

## 2021-12-15 DIAGNOSIS — N186 End stage renal disease: Secondary | ICD-10-CM | POA: Diagnosis not present

## 2021-12-16 ENCOUNTER — Ambulatory Visit (INDEPENDENT_AMBULATORY_CARE_PROVIDER_SITE_OTHER): Payer: Medicare Other

## 2021-12-16 VITALS — Ht 63.0 in | Wt 190.0 lb

## 2021-12-16 DIAGNOSIS — K5730 Diverticulosis of large intestine without perforation or abscess without bleeding: Secondary | ICD-10-CM | POA: Insufficient documentation

## 2021-12-16 DIAGNOSIS — R194 Change in bowel habit: Secondary | ICD-10-CM | POA: Insufficient documentation

## 2021-12-16 DIAGNOSIS — Z83719 Family history of colon polyps, unspecified: Secondary | ICD-10-CM | POA: Insufficient documentation

## 2021-12-16 DIAGNOSIS — Z0000 Encounter for general adult medical examination without abnormal findings: Secondary | ICD-10-CM | POA: Diagnosis not present

## 2021-12-16 DIAGNOSIS — Z8371 Family history of colonic polyps: Secondary | ICD-10-CM | POA: Insufficient documentation

## 2021-12-16 DIAGNOSIS — Z1211 Encounter for screening for malignant neoplasm of colon: Secondary | ICD-10-CM | POA: Insufficient documentation

## 2021-12-16 NOTE — Patient Instructions (Addendum)
[name] , Thank you for taking time to come for your Medicare Wellness Visit. I appreciate your ongoing commitment to your health goals. Please review the following plan we discussed and let me know if I can assist you in the future.   Screening recommendations/referrals: Colonoscopy: Done 09/19/2021 - Repeat in 10 years Mammogram: Done 11/23/2021 - Repeat annually  Bone Density: ne 07/23/2017 - Repeat every 2 years *past due - get at next visit Recommended yearly ophthalmology/optometry visit for glaucoma screening and checkup Recommended yearly dental visit for hygiene and checkup  Vaccinations: Influenza vaccine: Done 09/2021 - Repeat annually  Pneumococcal vaccine: Done 07/07/2017 & 12/13/2018 Tdap vaccine: Done 08/24/2011 - Repeat in 10 years *due - get at next visit Shingles vaccine: done 09/09/2019 & 11/10/2019   Covid-19:Done 02/09/20, 03/11/20, 11/03/20, & 10/28/20222  Advanced directives: Advance directive discussed with you today. Even though you declined this today, please call our office should you change your mind, and we can give you the proper paperwork for you to fill out.   Conditions/risks identified: Aim for 30 minutes of exercise or walking each day, drink 6-8 glasses of water and eat lots of fruits and vegetables.   Next appointment: Follow up in one year for your annual wellness visit    Preventive Care 65 Years and Older, Female Preventive care refers to lifestyle choices and visits with your health care provider that can promote health and wellness. What does preventive care include? A yearly physical exam. This is also called an annual well check. Dental exams once or twice a year. Routine eye exams. Ask your health care provider how often you should have your eyes checked. Personal lifestyle choices, including: Daily care of your teeth and gums. Regular physical activity. Eating a healthy diet. Avoiding tobacco and drug use. Limiting alcohol use. Practicing safe  sex. Taking low-dose aspirin every day. Taking vitamin and mineral supplements as recommended by your health care provider. What happens during an annual well check? The services and screenings done by your health care provider during your annual well check will depend on your age, overall health, lifestyle risk factors, and family history of disease. Counseling  Your health care provider may ask you questions about your: Alcohol use. Tobacco use. Drug use. Emotional well-being. Home and relationship well-being. Sexual activity. Eating habits. History of falls. Memory and ability to understand (cognition). Work and work Statistician. Reproductive health. Screening  You may have the following tests or measurements: Height, weight, and BMI. Blood pressure. Lipid and cholesterol levels. These may be checked every 5 years, or more frequently if you are over 45 years old. Skin check. Lung cancer screening. You may have this screening every year starting at age 37 if you have a 30-pack-year history of smoking and currently smoke or have quit within the past 15 years. Fecal occult blood test (FOBT) of the stool. You may have this test every year starting at age 10. Flexible sigmoidoscopy or colonoscopy. You may have a sigmoidoscopy every 5 years or a colonoscopy every 10 years starting at age 72. Hepatitis C blood test. Hepatitis B blood test. Sexually transmitted disease (STD) testing. Diabetes screening. This is done by checking your blood sugar (glucose) after you have not eaten for a while (fasting). You may have this done every 1-3 years. Bone density scan. This is done to screen for osteoporosis. You may have this done starting at age 93. Mammogram. This may be done every 1-2 years. Talk to your health care provider  about how often you should have regular mammograms. Talk with your health care provider about your test results, treatment options, and if necessary, the need for more  tests. Vaccines  Your health care provider may recommend certain vaccines, such as: Influenza vaccine. This is recommended every year. Tetanus, diphtheria, and acellular pertussis (Tdap, Td) vaccine. You may need a Td booster every 10 years. Zoster vaccine. You may need this after age 49. Pneumococcal 13-valent conjugate (PCV13) vaccine. One dose is recommended after age 6. Pneumococcal polysaccharide (PPSV23) vaccine. One dose is recommended after age 49. Talk to your health care provider about which screenings and vaccines you need and how often you need them. This information is not intended to replace advice given to you by your health care provider. Make sure you discuss any questions you have with your health care provider. Document Released: 12/17/2015 Document Revised: 08/09/2016 Document Reviewed: 09/21/2015 Elsevier Interactive Patient Education  2017 [employer] Prevention in the Home Falls can cause injuries. They can happen to people of all ages. There are many things you can do to make your home safe and to help prevent falls. What can I do on the outside of my home? Regularly fix the edges of walkways and driveways and fix any cracks. Remove anything that might make you trip as you walk through a door, such as a raised step or threshold. Trim any bushes or trees on the path to your home. Use bright outdoor lighting. Clear any walking paths of anything that might make someone trip, such as rocks or tools. Regularly check to see if handrails are loose or broken. Make sure that both sides of any steps have handrails. Any raised decks and porches should have guardrails on the edges. Have any leaves, snow, or ice cleared regularly. Use sand or salt on walking paths during winter. Clean up any spills in your garage right away. This includes oil or grease spills. What can I do in the bathroom? Use night lights. Install grab bars by the toilet and in the tub and shower.  Do not use towel bars as grab bars. Use non-skid mats or decals in the tub or shower. If you need to sit down in the shower, use a plastic, non-slip stool. Keep the floor dry. Clean up any water that spills on the floor as soon as it happens. Remove soap buildup in the tub or shower regularly. Attach bath mats securely with double-sided non-slip rug tape. Do not have throw rugs and other things on the floor that can make you trip. What can I do in the bedroom? Use night lights. Make sure that you have a light by your bed that is easy to reach. Do not use any sheets or blankets that are too big for your bed. They should not hang down onto the floor. Have a firm chair that has side arms. You can use this for support while you get dressed. Do not have throw rugs and other things on the floor that can make you trip. What can I do in the kitchen? Clean up any spills right away. Avoid walking on wet floors. Keep items that you use a lot in easy-to-reach places. If you need to reach something above you, use a strong step stool that has a grab bar. Keep electrical cords out of the way. Do not use floor polish or wax that makes floors slippery. If you must use wax, use non-skid floor wax. Do not have throw rugs and  other things on the floor that can make you trip. What can I do with my stairs? Do not leave any items on the stairs. Make sure that there are handrails on both sides of the stairs and use them. Fix handrails that are broken or loose. Make sure that handrails are as long as the stairways. Check any carpeting to make sure that it is firmly attached to the stairs. Fix any carpet that is loose or worn. Avoid having throw rugs at the top or bottom of the stairs. If you do have throw rugs, attach them to the floor with carpet tape. Make sure that you have a light switch at the top of the stairs and the bottom of the stairs. If you do not have them, ask someone to add them for you. What else  can I do to help prevent falls? Wear shoes that: Do not have high heels. Have rubber bottoms. Are comfortable and fit you well. Are closed at the toe. Do not wear sandals. If you use a stepladder: Make sure that it is fully opened. Do not climb a closed stepladder. Make sure that both sides of the stepladder are locked into place. Ask someone to hold it for you, if possible. Clearly mark and make sure that you can see: Any grab bars or handrails. First and last steps. Where the edge of each step is. Use tools that help you move around (mobility aids) if they are needed. These include: Canes. Walkers. Scooters. Crutches. Turn on the lights when you go into a dark area. Replace any light bulbs as soon as they burn out. Set up your furniture so you have a clear path. Avoid moving your furniture around. If any of your floors are uneven, fix them. If there are any pets around you, be aware of where they are. Review your medicines with your doctor. Some medicines can make you feel dizzy. This can increase your chance of falling. Ask your doctor what other things that you can do to help prevent falls. This information is not intended to replace advice given to you by your health care provider. Make sure you discuss any questions you have with your health care provider. Document Released: 09/16/2009 Document Revised: 04/27/2016 Document Reviewed: 12/25/2014 Elsevier Interactive Patient Education  2017 [employer].

## 2021-12-16 NOTE — Progress Notes (Signed)
Subjective:   [name] is a 71 y.o. female who presents for Medicare Annual (Subsequent) preventive examination.  Virtual Visit via Telephone Note  I connected with  [name] on 12/16/21 at  2:45 PM EST by telephone and verified that I am speaking with the correct person using two identifiers.  Location: Patient: Home Provider: [facility] Persons participating in the virtual visit: patient/Nurse Health Advisor   I discussed the limitations, risks, security and privacy concerns of performing an evaluation and management service by telephone and the availability of in person appointments. The patient expressed understanding and agreed to proceed.  Interactive audio and video telecommunications were attempted between this nurse and patient, however failed, due to patient having technical difficulties OR patient did not have access to video capability.  We continued and completed visit with audio only.  Some vital signs may be absent or patient reported.   Tamara Sweeten E Jazmina Muhlenkamp, LPN   Review of Systems     Cardiac Risk Factors include: advanced age (>47men, >85 women);diabetes mellitus;sedentary lifestyle;obesity (BMI >30kg/m2);dyslipidemia;hypertension;family history of premature cardiovascular disease;Other (see comment), Risk factor comments: CHF, PAD, ESRD on dialysis, COPD, leukocytosis     Objective:    Today's Vitals   12/16/21 1436 12/16/21 1438  Weight: 190 lb (86.2 kg)   Height: 5\' 3"  (1.6 m)   PainSc:  0-No pain   Body mass index is 33.66 kg/m.  Advanced Directives 12/16/2021 12/06/2021 12/06/2021 04/15/2021 02/20/2021 02/19/2021 05/24/2020  Does Patient Have a Medical Advance Directive? No No No No - No No  Would patient like information on creating a medical advance directive? No - Patient declined No - Patient declined No - Patient declined No - Patient declined No - Patient declined - No - Patient declined  Pre-existing out of facility DNR order (yellow form or pink MOST  form) - - - - - - -    Current Medications (verified) Outpatient Encounter Medications as of 12/16/2021  Medication Sig   acetaminophen (TYLENOL) 500 MG tablet Take 1,000 mg by mouth every 6 (six) hours as needed for moderate pain or headache.   albuterol (PROVENTIL) (2.5 MG/3ML) 0.083% nebulizer solution Take 3 mLs (2.5 mg total) by nebulization every 6 (six) hours as needed for wheezing or shortness of breath.   albuterol (VENTOLIN HFA) 108 (90 Base) MCG/ACT inhaler Inhale 2 puffs into the lungs every 6 (six) hours as needed for wheezing or shortness of breath.   aspirin 81 MG EC tablet Take 81 mg by mouth daily.   atenolol (TENORMIN) 50 MG tablet Take 1 tablet (50 mg total) by mouth 2 (two) times daily.   budesonide-formoterol (SYMBICORT) 160-4.5 MCG/ACT inhaler Inhale 2 puffs into the lungs 2 (two) times daily.   busPIRone (BUSPAR) 15 MG tablet Take 1 tablet (15 mg total) by mouth 2 (two) times daily.   calcium acetate (PHOSLO) 667 MG capsule Take 667-1,334 mg by mouth See admin instructions. Take 1334 mg with meals 3 times daily and 667 mg with snacks   Carboxymethylcellul-Glycerin (LUBRICATING EYE DROPS OP) Place 1 drop into both eyes daily as needed (dry eyes).   cetirizine (ZYRTEC) 10 MG tablet Take 1 tablet (10 mg total) by mouth daily as needed for allergies.   Continuous Blood Gluc Receiver (FREESTYLE LIBRE 2 READER) DEVI USE TO TEST BLOOD SUGAR CONTINUOUSLY   Continuous Blood Gluc Sensor (FREESTYLE LIBRE 2 SENSOR) MISC Use multiple times daily to track glucose to prevent highs and lows as a complication of dialysis  Dx:Z99.2, E11.59   gabapentin (NEURONTIN) 400 MG capsule Take 1 capsule (400 mg total) by mouth See admin instructions. Take it after HD   glucose blood (ONETOUCH ULTRA) test strip Use to test blood sugar 3 times daily as directed. DX: E11.9   Insulin Glargine (BASAGLAR KWIKPEN) 100 UNIT/ML Inject 25 Units into the skin at bedtime. Add five units each time the fasting is  over 125 three days in a row.   montelukast (SINGULAIR) 10 MG tablet Take 10 mg by mouth at bedtime.   NIFEdipine (PROCARDIA XL/NIFEDICAL XL) 60 MG 24 hr tablet TAKE 1 TABLET BY MOUTH ONCE DAILY (ON  NON-DIALYSIS  DAYS)   nitroGLYCERIN (NITROSTAT) 0.4 MG SL tablet Place 1 tablet (0.4 mg total) under the tongue every 5 (five) minutes as needed for chest pain.   omeprazole (PRILOSEC) 20 MG capsule Take 1 capsule (20 mg total) by mouth daily.   ondansetron (ZOFRAN) 4 MG tablet TAKE 1 TABLET BY MOUTH EVERY 8 HOURS AS NEEDED FOR NAUSEA AND VOMITING   QUEtiapine (SEROQUEL) 100 MG tablet Take 1 tablet (100 mg total) by mouth at bedtime. For sleep and for anxiety   rOPINIRole (REQUIP) 1 MG tablet Take 1 tablet (1 mg total) by mouth at bedtime. For leg cramps   rosuvastatin (CRESTOR) 5 MG tablet Take 1 tablet (5 mg total) by mouth daily. For cholesterol   Semaglutide,0.25 or 0.5MG /DOS, (OZEMPIC, 0.25 OR 0.5 MG/DOSE,) 2 MG/1.5ML SOPN Inject 0.25 mg into the skin once a week. Friday   No facility-administered encounter medications on file as of 12/16/2021.    Allergies (verified) Azithromycin, Ace inhibitors, Clopidogrel bisulfate, Codeine, Lisinopril, and Penicillins   History: Past Medical History:  Diagnosis Date   Acute on chronic diastolic CHF (congestive heart failure) ([place]) 03/05/2015   Grade 2. EF 60-65%   Anemia    hx of   Anxiety    Arthritis    CAD (coronary artery disease)    s/p Stenting in 2005;  [place] 7/09: Vigorous LV function, 2-3+ MR, RI 40%, RI stent patent, proximal-mid RCA 40-50%;  Echo 7/09:  EF 55-65%, trivial MR;  [place] 11/18/12: EF 66%, normal LV wall motion, mild to moderate anterior ischemia - reviewed by [facility] and felt to rep breast atten (low risk);  Echo 6/14: mild LVH, EF 55-65%, Gr 2 DD, PASP 44, trivial eff    Chronic kidney disease    CREATININE IS UP--GOING TO KIDNEY MD    Chronic neck pain    COPD (chronic obstructive pulmonary disease) (HCC)    Degenerative joint  disease    left shoulder   Diabetic neuropathy (HCC)    Diverticulosis    Esophageal stricture    GERD (gastroesophageal reflux disease)    Hyperlipidemia    Hypertension    IDDM (insulin dependent diabetes mellitus)    Pericardial effusion 03/06/2015   Very small per echo ; pleural nodules seen on CT chest.   Peripheral vascular disease ([place])    Stroke ([place])    "mini- years ago"   Vitamin D deficiency    Past Surgical History:  Procedure Laterality Date   A/V FISTULAGRAM N/A 08/10/2017   Procedure: A/V Fistulagram - Right Arm;  Surgeon: Elam Dutch, MD;  Location: [place] [facility];  Service: Cardiovascular;  Laterality: N/A;   A/V FISTULAGRAM N/A 03/03/2020   Procedure: A/V FISTULAGRAM - Right Arm;  Surgeon: Marty Heck, MD;  Location: [place] [facility];  Service: Cardiovascular;  Laterality: N/A;  ABDOMINAL HYSTERECTOMY     AV FISTULA PLACEMENT Right 07/20/2015   Procedure: RADIAL CEPHALIC ARTERIOVENOUS FISTULA CREATION RIGHT ARM;  Surgeon: Angelia Mould, MD;  Location: [place];  Service: Vascular;  Laterality: Right;   AV FISTULA PLACEMENT Right 11/26/2015   Procedure:  Creation of RIGHT BRACHIO-CEPHALIC arteriovenous fistula;  Surgeon: Angelia Mould, MD;  Location: [facility] OR;  Service: Vascular;  Laterality: Right;   BACK SURGERY     2007 & 2013   [facility]   CARDIAC CATHETERIZATION  2003, 2005, 2009   1 stent   cardiac stents     CATARACT EXTRACTION W/PHACO Right 05/04/2014   Procedure: CATARACT EXTRACTION PHACO AND INTRAOCULAR LENS PLACEMENT ([place]);  Surgeon: Williams Che, MD;  Location: [facility];  Service: Ophthalmology;  Laterality: Right;  CDE 1.47   CATARACT EXTRACTION W/PHACO Left 07/20/2014   Procedure: CATARACT EXTRACTION WITH PHACO AND INTRAOCULAR LENS PLACEMENT LEFT EYE CDE=1.79;  Surgeon: Williams Che, MD;  Location: [facility];  Service: Ophthalmology;  Laterality: Left;   CERVICAL SPINE SURGERY  2012   8 screws   CYSTOSCOPY  WITH INJECTION N/A 06/03/2013   Procedure: MACROPLASTIQUE  INJECTION ;  Surgeon: Malka So, MD;  Location: [facility];  Service: Urology;  Laterality: N/A;   FISTULOGRAM Right 10/20/2016   Procedure: FISTULOGRAM WITH POSSIBLE INTERVENTION;  Surgeon: [name], MD;  Location: [facility];  Service: Vascular;  Laterality: Right;   INSERTION OF DIALYSIS CATHETER N/A 11/26/2015   Procedure: INSERTION OF DIALYSIS CATHETER;  Surgeon: Angelia Mould, MD;  Location: [place];  Service: Vascular;  Laterality: N/A;   LAMINECTOMY  12/29/2011   LUMBAR LAMINECTOMY/DECOMPRESSION MICRODISCECTOMY  12/29/2011   Procedure: LUMBAR LAMINECTOMY/DECOMPRESSION MICRODISCECTOMY;  Surgeon: Floyce Stakes, MD;  Location: [place] [facility];  Service: Neurosurgery;  Laterality: N/A;  Lumbar Two through Lumbar Five Laminectomies/Cellsaver   PARTIAL HYSTERECTOMY     PERIPHERAL VASCULAR BALLOON ANGIOPLASTY  03/03/2020   Procedure: PERIPHERAL VASCULAR BALLOON ANGIOPLASTY;  Surgeon: Marty Heck, MD;  Location: [place] [facility];  Service: Cardiovascular;;  Rt arm fistula   PERIPHERAL VASCULAR BALLOON ANGIOPLASTY Right 04/15/2021   Procedure: PERIPHERAL VASCULAR BALLOON ANGIOPLASTY;  Surgeon: Angelia Mould, MD;  Location: [place] [facility];  Service: Cardiovascular;  Laterality: Right;  arm fistula   PERIPHERAL VASCULAR CATHETERIZATION N/A 11/22/2015   Procedure: Fistulagram;  Surgeon: Angelia Mould, MD;  Location: [facility];  Service: Cardiovascular;  Laterality: N/A;   PERIPHERAL VASCULAR INTERVENTION  08/10/2017   Procedure: PERIPHERAL VASCULAR INTERVENTION;  Surgeon: Elam Dutch, MD;  Location: [place] [facility];  Service: Cardiovascular;;   SHOULDER SURGERY Right    Rotator cuff   Family History  Problem Relation Age of Onset   Heart disease Other    Hypertension Father    Cancer Father    Hypertension Mother    Hypertension Daughter    Pancreatitis Brother    Hypertension  Daughter    Diabetes Daughter    Social History   Socioeconomic History   Marital status: Widowed    Spouse name: Not on file   Number of children: 2   Years of education: GED   Highest education level: GED or equivalent  Occupational History   Occupation: Disabled  Tobacco Use   Smoking status: Former    Packs/day: 1.00    Years: 30.00    Pack years: 30.00    Types: Cigarettes  Quit date: 12/04/2001    Years since quitting: 20.0   Smokeless tobacco: Never  Vaping Use   Vaping Use: Never used  Substance and Sexual Activity   Alcohol use: No    Alcohol/week: 6.0 standard drinks    Types: 6 Cans of beer per week   Drug use: No   Sexual activity: Not Currently    Birth control/protection: Surgical  Other Topics Concern   Not on file  Social History Narrative   Lives in [place] with mother   Daughters live nearby   Social Determinants of Health   Financial Resource Strain: Low Risk    Difficulty of Paying Living Expenses: Not hard at all  Food Insecurity: No Food Insecurity   Worried About Charity fundraiser in the Last Year: Never true   Arboriculturist in the Last Year: Never true  Transportation Needs: No Transportation Needs   Lack of Transportation (Medical): No   Lack of Transportation (Non-Medical): No  Physical Activity: Inactive   Days of Exercise per Week: 0 days   Minutes of Exercise per Session: 0 min  Stress: No Stress Concern Present   Feeling of Stress : Not at all  Social Connections: Moderately Isolated   Frequency of Communication with Friends and Family: More than three times a week   Frequency of Social Gatherings with Friends and Family: More than three times a week   Attends Religious Services: More than 4 times per year   Active Member of [employer] or Organizations: No   Attends Archivist Meetings: Never   Marital Status: Widowed    Tobacco Counseling Counseling given: Not Answered   Clinical Intake:  Pre-visit  preparation completed: Yes  Pain : No/denies pain Pain Score: 0-No pain     BMI - recorded: 33.66 Nutritional Status: BMI > 30  Obese Nutritional Risks: None Diabetes: Yes CBG done?: No Did pt. bring in CBG monitor from home?: No  How often do you need to have someone help you when you read instructions, pamphlets, or other written materials from your doctor or pharmacy?: 1 - Never  Diabetic? Nutrition Risk Assessment:  Has the patient had any N/V/D within the last 2 months?  No  Does the patient have any non-healing wounds?  No  Has the patient had any unintentional weight loss or weight gain?  No   Diabetes:  Is the patient diabetic?  Yes  If diabetic, was a CBG obtained today?  No  Did the patient bring in their glucometer from home?  No  How often do you monitor your CBG's? Cgm - libre .   Financial Strains and Diabetes Management:  Are you having any financial strains with the device, your supplies or your medication? No .  Does the patient want to be seen by Chronic Care Management for management of their diabetes?  No  Would the patient like to be referred to a Nutritionist or for Diabetic Management?  No   Diabetic Exams:  Diabetic Eye Exam: Completed 11/02/2021.   Diabetic Foot Exam: Completed 01/31/2021. Pt has been advised about the importance in completing this exam. Pt is scheduled for diabetic foot exam on 03/2022.    Interpreter Needed?: No  Information entered by :: Dayvon Dax, LPN   Activities of Daily Living In your present state of health, do you have any difficulty performing the following activities: 12/16/2021 12/06/2021  Hearing? N N  Vision? N N  Difficulty concentrating or making decisions? N  N  Walking or climbing stairs? Y N  Comment SOB, hurts knees and back -  Dressing or bathing? N N  Doing errands, shopping? N N  Preparing Food and eating ? N -  Using the Toilet? N -  In the past six months, have you accidently leaked urine? Y -   Comment wears pads for protection -  Do you have problems with loss of bowel control? N -  Managing your Medications? N -  Managing your Finances? N -  Housekeeping or managing your Housekeeping? Y -  Comment difficult -  Some recent data might be hidden    Patient Care Team: [name], MD as PCP - General (Family Medicine) Minus Breeding, MD as PCP - Cardiology (Cardiology) Fran Lowes, MD (Inactive) as Consulting Physician (Nephrology) [name], MD as Referring Physician (Family Medicine) Minus Breeding, MD as Consulting Physician (Cardiology) [place], [place] [facility], Delice Bison, RN as Case Manager [place], Royce Macadamia, [facility] (Pharmacist) [place], [place] any recent Medical Services you may have received from other than [facility] providers in the past year (date may be approximate).     Assessment:   This is a routine wellness examination for [place].  Hearing/Vision screen Hearing Screening - Comments:: Denies hearing difficulties  Vision Screening - Comments:: Wears rx glasses - up to date with annual eye exams at [place] issues and exercise activities discussed: Current Exercise Habits: The patient does not participate in regular exercise at present, Exercise limited by: cardiac condition(s);respiratory conditions(s);neurologic condition(s);orthopedic condition(s)   Goals Addressed             This Visit's Progress    "I'd like to be able to be more phsyically active"   Not on track    [place] (see longtitudinal plan of care for additional care plan information)  Decreased physical activity in patient with DM, hyperlipidemia, HTN, CHF, COPD, ESRD  Current Barriers:  Chronic leg pain  Nurse Case Manager Clinical Goal(s):  Over the next 60 days, patient will work with Consulting civil engineer to address needs related to decreased physical activity level  Interventions:  Chart  reviewed, including recent office notes and lab results Talked with patient by phone Encouraged patient to slowly increase activity level. Walk for short distances and rest as needed.  Discussed bilateral leg pain as a hindrance. Discussed prior testing and treatments Encouraged patient to reach out to PCP with any new or worsening symptoms Provided with CCM contact information and encouraged to reach out as needed  Reviewed and discussed upcoming appointments: Dr Livia Snellen 04/19/20  Patient Self Care Activities:  Performs ADL's independently Performs IADL's independently  Please see past updates related to this goal by clicking on the "Past Updates" button in the selected goal       Exercise 3x per week (30 min per time)   Not on track    Walk for 30 minutes 3 times per week       Depression Screen PHQ 2/9 Scores 12/16/2021 12/12/2021 12/12/2021 09/15/2021 09/15/2021 08/26/2021 06/09/2021  PHQ - 2 Score 0 0 0 0 0 0 0  PHQ- 9 Score 4 4 - 3 - - -    Fall Risk Fall Risk  12/16/2021 12/12/2021 09/15/2021 08/26/2021 06/08/2021  Falls in the past year? 0 0 0 0 0  Number falls in past yr: 0 - - - -  Injury with Fall? 0 - - - -  Risk for fall due to : Orthopedic patient - - - -  Follow up Falls prevention discussed - - - -    FALL RISK PREVENTION PERTAINING TO THE HOME:  Any stairs in or around the home? No  If so, are there any without handrails? No  Home free of loose throw rugs in walkways, pet beds, electrical cords, etc? Yes  Adequate lighting in your home to reduce risk of falls? Yes   ASSISTIVE DEVICES UTILIZED TO PREVENT FALLS:  Life alert? No  Use of a cane, walker or w/c? Yes  Grab bars in the bathroom? Yes  Shower chair or bench in shower? No  Elevated toilet seat or a handicapped toilet? Yes   TIMED UP AND GO:  Was the test performed? No . Telephonic visit  Cognitive Function: Normal cognitive status assessed by direct observation by this Nurse Health Advisor. No  abnormalities found.    MMSE - Mini Mental State Exam 06/17/2018 10/06/2016  Orientation to time 5 5  Orientation to Place 5 5  Registration 3 3  Attention/ Calculation 5 5  Recall 1 3  Language- name 2 objects 2 2  Language- repeat 1 1  Language- follow 3 step command 3 3  Language- read & follow direction 1 1  Write a sentence 1 1  Copy design 1 1  Total score 28 30     6CIT Screen 05/24/2020  What Year? 0 points  What month? 0 points  What time? 0 points  Count back from 20 0 points  Months in reverse 0 points  Repeat phrase 0 points  Total Score 0    Immunizations Immunization History  Administered Date(s) Administered   DTaP 07/19/2010   Hepatitis B, adult 12/04/2015, 01/04/2016, 02/08/2016, 06/01/2016, 10/17/2016, 11/11/2016, 12/16/2016, 04/12/2017, 04/08/2019   Influenza Split 10/04/2013, 09/14/2015   Influenza Whole 09/03/2006   Influenza, Seasonal, Injecte, Preservative Fre 08/24/2011, 09/25/2012   Influenza,inj,Quad PF,6+ Mos 09/11/2013, 09/09/2014   Influenza-Unspecified 09/14/2015, 08/25/2016, 08/21/2019, 09/09/2020, 09/15/2021   Moderna SARS-COV2 Booster Vaccination 11/03/2020, 09/30/2021   Moderna Sars-Covid-2 Vaccination 02/09/2020, 03/11/2020   PPD Test 11/23/2015, 12/26/2016, 01/03/2018, 01/09/2019, 04/20/2020, 03/31/2021   Pneumococcal Conjugate-13 07/07/2017   Pneumococcal Polysaccharide-23 08/26/2012, 07/07/2017, 12/13/2018   Pneumococcal-Unspecified 08/26/2012, 07/07/2017   Td 07/19/2010   Tdap 08/24/2011   Zoster Recombinat (Shingrix) 09/09/2019, 11/10/2019    TDAP status: Due, Education has been provided regarding the importance of this vaccine. Advised may receive this vaccine at local pharmacy or Health Dept. Aware to provide a copy of the vaccination record if obtained from local pharmacy or Health Dept. Verbalized acceptance and understanding.  Flu Vaccine status: Up to date  Pneumococcal vaccine status: Up to date  Covid-19 vaccine  status: Completed vaccines  Qualifies for Shingles Vaccine? Yes   Zostavax completed Yes   Shingrix Completed?: Yes  Screening Tests Health Maintenance  Topic Date Due   COVID-19 Vaccine (3 - Moderna risk series) 12/28/2021 (Originally 10/28/2021)   TETANUS/TDAP  12/12/2022 (Originally 08/23/2021)   FOOT EXAM  01/31/2022   HEMOGLOBIN A1C  06/05/2022   OPHTHALMOLOGY EXAM  11/02/2022   MAMMOGRAM  11/04/2022   COLONOSCOPY (Pts 45-41yrs Insurance coverage will need to be confirmed)  09/20/2031   Pneumonia Vaccine 40+ Years old  Completed   INFLUENZA VACCINE  Completed   DEXA SCAN  Completed   Hepatitis C Screening  Completed   Zoster Vaccines- Shingrix  Completed   HPV VACCINES  Aged Out    Health Maintenance  There are  no preventive care reminders to display for this patient.  Colorectal cancer screening: Type of screening: Colonoscopy. Completed 09/19/2021. Repeat every 10 years  Mammogram status: Completed 11/23/2021. Repeat every year  Bone Density status: Completed 07/23/2017. Results reflect: Bone density results: OSTEOPOROSIS. Repeat every 2 years. Advised patient to get at next OV  Lung Cancer Screening: (Low Dose CT Chest recommended if Age 4-80 years, 30 pack-year currently smoking OR have quit w/in 15years.) does not qualify.   Additional Screening:  Hepatitis C Screening: does qualify; Completed 11/23/2015  Vision Screening: Recommended annual ophthalmology exams for early detection of glaucoma and other disorders of the eye. Is the patient up to date with their annual eye exam?  Yes  Who is the provider or what is the name of the office in which the patient attends annual eye exams? [facility] If pt is not established with a provider, would they like to be referred to a provider to establish care? No .   Dental Screening: Recommended annual dental exams for proper oral hygiene  Community Resource Referral / Chronic Care Management: CRR required this  visit?  No   CCM required this visit?  No      Plan:     I have personally reviewed and noted the following in the patients chart:   Medical and social history Use of alcohol, tobacco or illicit drugs  Current medications and supplements including opioid prescriptions.  Functional ability and status Nutritional status Physical activity Advanced directives List of other physicians Hospitalizations, surgeries, and ER visits in previous 12 months Vitals Screenings to include cognitive, depression, and falls Referrals and appointments  In addition, I have reviewed and discussed with patient certain preventive protocols, quality metrics, and best practice recommendations. A written personalized care plan for preventive services as well as general preventive health recommendations were provided to patient.     Sandrea Hammond, LPN   [id number]   Nurse Notes: None

## 2021-12-17 DIAGNOSIS — N186 End stage renal disease: Secondary | ICD-10-CM | POA: Diagnosis not present

## 2021-12-17 DIAGNOSIS — D509 Iron deficiency anemia, unspecified: Secondary | ICD-10-CM | POA: Diagnosis not present

## 2021-12-17 DIAGNOSIS — D631 Anemia in chronic kidney disease: Secondary | ICD-10-CM | POA: Diagnosis not present

## 2021-12-17 DIAGNOSIS — Z992 Dependence on renal dialysis: Secondary | ICD-10-CM | POA: Diagnosis not present

## 2021-12-17 DIAGNOSIS — N2581 Secondary hyperparathyroidism of renal origin: Secondary | ICD-10-CM | POA: Diagnosis not present

## 2021-12-20 ENCOUNTER — Telehealth: Payer: Self-pay | Admitting: Cardiology

## 2021-12-20 DIAGNOSIS — D631 Anemia in chronic kidney disease: Secondary | ICD-10-CM | POA: Diagnosis not present

## 2021-12-20 DIAGNOSIS — N186 End stage renal disease: Secondary | ICD-10-CM | POA: Diagnosis not present

## 2021-12-20 DIAGNOSIS — D509 Iron deficiency anemia, unspecified: Secondary | ICD-10-CM | POA: Diagnosis not present

## 2021-12-20 DIAGNOSIS — Z992 Dependence on renal dialysis: Secondary | ICD-10-CM | POA: Diagnosis not present

## 2021-12-20 DIAGNOSIS — N2581 Secondary hyperparathyroidism of renal origin: Secondary | ICD-10-CM | POA: Diagnosis not present

## 2021-12-20 NOTE — Telephone Encounter (Signed)
Patient called stating she already had an echo done on 12/06/21, she is wondering if the one she has scheduled in March is necessary.   STAT if HR is under 50 or over 120 (normal HR is 60-100 beats per minute)  What is your heart rate?  today at dialysis it was high at 105  Do you have a log of your heart rate readings (document readings)? no  Do you have any other symptoms? no

## 2021-12-20 NOTE — Progress Notes (Signed)
Received notification from [place] regarding approval for [place]. Patient assistance approved from 12/08/21 to 11/02/22.  Phone: [phone]

## 2021-12-20 NOTE — Telephone Encounter (Signed)
Spoke with patient who stated that while she was in the hospital recently, she had an echo done. She wants to know if the echo scheduled in March is still necessary. She also reported that during dialysis today, her pulse went to 105 and it concerned her because this hasn't happened before. Recommended she and the dialysis nurse advise her nephrologist.

## 2021-12-22 DIAGNOSIS — Z992 Dependence on renal dialysis: Secondary | ICD-10-CM | POA: Diagnosis not present

## 2021-12-22 DIAGNOSIS — D631 Anemia in chronic kidney disease: Secondary | ICD-10-CM | POA: Diagnosis not present

## 2021-12-22 DIAGNOSIS — N186 End stage renal disease: Secondary | ICD-10-CM | POA: Diagnosis not present

## 2021-12-22 DIAGNOSIS — D509 Iron deficiency anemia, unspecified: Secondary | ICD-10-CM | POA: Diagnosis not present

## 2021-12-22 DIAGNOSIS — N2581 Secondary hyperparathyroidism of renal origin: Secondary | ICD-10-CM | POA: Diagnosis not present

## 2021-12-22 NOTE — Telephone Encounter (Signed)
Spoke with pt, aware echo cancelled.

## 2021-12-24 DIAGNOSIS — Z992 Dependence on renal dialysis: Secondary | ICD-10-CM | POA: Diagnosis not present

## 2021-12-24 DIAGNOSIS — N186 End stage renal disease: Secondary | ICD-10-CM | POA: Diagnosis not present

## 2021-12-24 DIAGNOSIS — D509 Iron deficiency anemia, unspecified: Secondary | ICD-10-CM | POA: Diagnosis not present

## 2021-12-24 DIAGNOSIS — N2581 Secondary hyperparathyroidism of renal origin: Secondary | ICD-10-CM | POA: Diagnosis not present

## 2021-12-24 DIAGNOSIS — D631 Anemia in chronic kidney disease: Secondary | ICD-10-CM | POA: Diagnosis not present

## 2021-12-27 ENCOUNTER — Telehealth: Payer: Self-pay

## 2021-12-27 DIAGNOSIS — N2581 Secondary hyperparathyroidism of renal origin: Secondary | ICD-10-CM | POA: Diagnosis not present

## 2021-12-27 DIAGNOSIS — Z992 Dependence on renal dialysis: Secondary | ICD-10-CM | POA: Diagnosis not present

## 2021-12-27 DIAGNOSIS — D631 Anemia in chronic kidney disease: Secondary | ICD-10-CM | POA: Diagnosis not present

## 2021-12-27 DIAGNOSIS — N186 End stage renal disease: Secondary | ICD-10-CM | POA: Diagnosis not present

## 2021-12-27 DIAGNOSIS — D509 Iron deficiency anemia, unspecified: Secondary | ICD-10-CM | POA: Diagnosis not present

## 2021-12-27 NOTE — Telephone Encounter (Signed)
Transition Care Management Follow-up Telephone Call Date of discharge and from where: 12/10/21 from [facility] How have you been since you were released from the hospital? "I been doing pretty good" Any questions or concerns? No  Items Reviewed: Did the pt receive and understand the discharge instructions provided? Yes  Medications obtained and verified? Yes  Other? No  Any new allergies since your discharge? No  Dietary orders reviewed? yes Do you have support at home? Yes   Home Care and Equipment/Supplies: Were home health services ordered? no If so, what is the name of the agency? Not applicable  Has the agency set up a time to come to the patient's home? not applicable Were any new equipment or medical supplies ordered?  No What is the name of the medical supply agency? Not applicable Were you able to get the supplies/equipment? not applicable Do you have any questions related to the use of the equipment or supplies? No  Functional Questionnaire: (I = Independent and D = Dependent) ADLs: I  Bathing/Dressing- I  Meal Prep- I  Eating- I  Maintaining continence- I  Transferring/Ambulation- I  Managing Meds- I  Follow up appointments reviewed:  PCP Hospital f/u appt confirmed? Saw Dr. Livia Snellen on 12/12/21 Specialist Hospital f/u appt confirmed?  Office Pharmacist   Scheduled to see 12/28/21 @11 :30  Are transportation arrangements needed? No  If their condition worsens, is the pt aware to call PCP or go to the Emergency Dept.? Yes Was the patient provided with contact information for the PCP's office or ED? Yes Was to pt encouraged to call back with questions or concerns? Yes   Thea Silversmith, RN, MSN, BSN, [place] Care Management Coordinator [phone]

## 2021-12-28 ENCOUNTER — Ambulatory Visit: Payer: Medicare Other | Admitting: Pharmacist

## 2021-12-28 DIAGNOSIS — E1121 Type 2 diabetes mellitus with diabetic nephropathy: Secondary | ICD-10-CM

## 2021-12-28 DIAGNOSIS — N186 End stage renal disease: Secondary | ICD-10-CM

## 2021-12-28 NOTE — Progress Notes (Signed)
Chronic Care Management Pharmacy Note  12/28/2021 Name:  [name] MRN:  [id number] DOB:  1951-10-26  Summary: t2dm, esrd  Recommendations/Changes made from today's visit: Diabetes: Goal on Track (progressing): YES. Uncontrolled--A1C 7.9%; current treatment: BASAGLAR 28 UNITS, Ozempic 0.82m-->INCREASE TO 0.5MG SQ WEEKLY NEXT DOSE INTOLERANCES:  DID NOT TOLERATE TRULICITY WELL DUE TO GI (EVEN AT LOWEST DOSE) T2DM REGIMEN LIMITED DUE TO HD TIW INCRESAE TO OZEMPIC 0.5MG SQ WEEKLY  Denies personal and family history of Medullary thyroid cancer (MTC) Sample given TOLERATING LOW DOSE OZEMPIC; OCCASIONALLY USES ZOFRAN FOR NAUSEA, BUT HAS OTHER CAUSES OF NAUSEA AS WELL [place] 12/06/21 (sample given today) LB[place]$0 COPAY TDearingTO CHECK ON COVERAGE--cancelled ADS duplicate orders for libre--just total medical supply now [place] CGM education provided Patient to use phone as reader--set up l[place]2 app Left arm (since graft/HD on right side)-replaced sensor Patient RE-enrolled in LAssurantpatient assistance program for free basgalar (med ships to PCP Office due to patient's HD schedule) Current glucose readings: fasting glucose: <155, post prandial glucose: n/a Reports hyperglycemic symptoms Must follow dialysis diet Current exercise: n/a; HD TIW Recommended continue low dose ozempic; new libre start--patient coming home from the hospital, therefore lElenor Legat[place]as removed--additional sample provided--I must clarify with company which one to proceed with (only one can fill her [place] Assessed patient finances. Submitted for Ozempic PATIENT ASSISTANCE via novo nordisk; APPLICATION SUBMITTED FOR SYMBICORT TO az&ME PATIENT ASSISTANCE--PATIENT DID NOT LIKE BREZTRI (received symbicort shipment at our office on 12/2021)  Hyperlipidemia:  Condition stable. Not addressed  this visit. Uncontrolled; current treatment:CRESTOR (ROSUVASTATIN 5MG DAILY);  Current dietary patterns: ON HD 3X WEEKLY; NOT HUGE APPETITE, HOWEVER DOES ENJOY SWEETS, OZEMPIC TO ASSIST Recommended HEART HEALTHY DIET, TAKE STATIN AS PRESCRIBED, NEW START OZEMPIC SHOULD LIKELY HELP WITH LIPID PANEL Lipid Panel     Component Value Date/Time   CHOL 203 (H) 01/31/2021 1001   CHOL 241 (H) 03/25/2013 1026   TRIG 240 (H) 01/31/2021 1001   TRIG 147 10/09/2013 1527   TRIG 209 (H) 03/25/2013 1026   HDL 36 (L) 01/31/2021 1001   HDL 65 10/09/2013 1527   HDL 51 03/25/2013 1026   CHOLHDL 5.6 (H) 01/31/2021 1001   CHOLHDL 3.0 06/17/2008 0625   VLDL 19 06/17/2008 0625   LDLCALC 124 (H) 01/31/2021 1001   LDLCALC 125 (H) 10/09/2013 1527   LDLCALC 148 (H) 03/25/2013 1026   LABVLDL 43 (H) 01/31/2021 1001   Patient Goals/Self-Care Activities patient will:  - take medications as prescribed as evidenced by patient report and record review check glucose DAILY OR IF SYMPTOMATIC, document, and provide at future appointments engage in dietary modifications by HEART HEALTHY/dialysis DIET   Plan:  Subjective: Tamara MCWHIRTERis an 71y.o. year old female who is a primary patient of [name], WCletus Gash MD.  The CCM team was consulted for assistance with disease management and care coordination needs.    Engaged with patient face to face for follow up visit in response to provider referral for pharmacy case management and/or care coordination services.   Consent to Services:  The patient was given information about Chronic Care Management services, agreed to services, and gave verbal consent prior to initiation of services.  Please see initial visit note for detailed documentation.   Patient Care Team: SClaretta Fraise MD as PCP - General (Family Medicine) HMinus Breeding MD  as PCP - Cardiology (Cardiology) Fran Lowes, MD (Inactive) as Consulting Physician (Nephrology) [name], MD as  Referring Physician (Family Medicine) Minus Breeding, MD as Consulting Physician (Cardiology) [place], [facility], Delice Bison, RN as Case Manager Lavera Guise, [facility] (Pharmacist) [place], [place] [facility]  Objective:  Lab Results  Component Value Date   CREATININE 6.35 (H) 12/08/2021   CREATININE 7.49 (H) 12/07/2021   CREATININE 8.06 (H) 12/07/2021    Lab Results  Component Value Date   HGBA1C 7.3 (H) 12/06/2021   Last diabetic Eye exam:  Lab Results  Component Value Date/Time   HMDIABEYEEXA No Retinopathy 11/02/2021 12:00 AM    Last diabetic Foot exam: No results found for: HMDIABFOOTEX      Component Value Date/Time   CHOL 203 (H) 01/31/2021 1001   CHOL 241 (H) 03/25/2013 1026   TRIG 240 (H) 01/31/2021 1001   TRIG 147 10/09/2013 1527   TRIG 209 (H) 03/25/2013 1026   HDL 36 (L) 01/31/2021 1001   HDL 65 10/09/2013 1527   HDL 51 03/25/2013 1026   CHOLHDL 5.6 (H) 01/31/2021 1001   CHOLHDL 3.0 06/17/2008 0625   VLDL 19 06/17/2008 0625   LDLCALC 124 (H) 01/31/2021 1001   LDLCALC 125 (H) 10/09/2013 1527   LDLCALC 148 (H) 03/25/2013 1026    Hepatic Function Latest Ref Rng & Units 12/07/2021 12/07/2021 02/19/2021  Total Protein 6.5 - 8.1 g/dL - 6.2(L) 6.8  Albumin 3.5 - 5.0 g/dL 3.4(L) 3.2(L) 3.3(L)  AST 15 - 41 U/L - 12(L) 17  ALT 0 - 44 U/L - 13 21  Alk Phosphatase 38 - 126 U/L - 62 87  Total Bilirubin 0.3 - 1.2 mg/dL - 0.3 0.2(L)  Bilirubin, Direct 0.0 - 0.2 mg/dL - - <0.1    Lab Results  Component Value Date/Time   TSH 2.450 03/07/2021 12:32 PM   TSH 2.261 03/05/2015 08:48 AM    CBC Latest Ref Rng & Units 12/08/2021 12/07/2021 12/07/2021  WBC 4.0 - 10.5 K/uL 10.7(H) 12.3(H) 12.8(H)  Hemoglobin 12.0 - 15.0 g/dL 9.3(L) 9.4(L) 9.4(L)  Hematocrit 36.0 - 46.0 % 30.3(L) 30.2(L) 29.6(L)  Platelets 150 - 400 K/uL 180 192 193    Lab Results  Component Value Date/Time   VD25OH 26.1 (L) 10/09/2013 03:27 PM   VD25OH 36  03/25/2013 10:26 AM    Clinical ASCVD: No  The ASCVD Risk score (Arnett DK, et al., 2019) failed to calculate for the following reasons:   The valid total cholesterol range is 130 to 320 mg/dL    Other: (CHADS2VASc if Afib, PHQ9 if depression, MMRC or CAT for COPD, ACT, DEXA)  Social History   Tobacco Use  Smoking Status Former   Packs/day: 1.00   Years: 30.00   Pack years: 30.00   Types: Cigarettes   Quit date: 12/04/2001   Years since quitting: 20.1  Smokeless Tobacco Never   BP Readings from Last 3 Encounters:  12/12/21 115/61  12/10/21 (!) 141/78  12/11/21 129/75   Pulse Readings from Last 3 Encounters:  12/12/21 76  12/10/21 65  11/09/21 68   Wt Readings from Last 3 Encounters:  12/16/21 190 lb (86.2 kg)  12/12/21 190 lb (86.2 kg)  12/10/21 185 lb 3 oz (84 kg)    Assessment: Review of patient past medical history, allergies, medications, health status, including review of consultants reports, laboratory and other test data, was performed as part of comprehensive evaluation and provision of chronic care management  services.   SDOH:  (Social Determinants of Health) assessments and interventions performed:    CCM Care Plan  Allergies  Allergen Reactions   Azithromycin     Prolonged QT   Ace Inhibitors Cough   Clopidogrel Bisulfate Other (See Comments)    Sick, Headache, "Felt terrible"   Codeine Other (See Comments)    hallucinations   Lisinopril Cough   Penicillins Other (See Comments)    Unknown reaction Did it involve swelling of the face/tongue/throat, SOB, or low BP? Unknown Did it involve sudden or severe rash/hives, skin peeling, or any reaction on the inside of your mouth or nose? Unknown Did you need to seek medical attention at a hospital or doctor's office? Unknown When did it last happen?      childhood allergy If all above answers are "NO", may proceed with cephalosporin use.      Medications Reviewed Today     Reviewed by Lavera Guise, [facility] (Pharmacist) on 01/08/22 at 2349  Med List Status: <None>   Medication Order Taking? Sig Documenting Provider Last Dose Status Informant  acetaminophen (TYLENOL) 500 MG tablet [id number] No Take 1,000 mg by mouth every 6 (six) hours as needed for moderate pain or headache. [provider] Taking Active Self  albuterol (PROVENTIL) (2.5 MG/3ML) 0.083% nebulizer solution [id number] No Take 3 mLs (2.5 mg total) by nebulization every 6 (six) hours as needed for wheezing or shortness of breath. [name], MD Taking Active Self  albuterol (VENTOLIN HFA) 108 (90 Base) MCG/ACT inhaler [id number] No Inhale 2 puffs into the lungs every 6 (six) hours as needed for wheezing or shortness of breath. [name], MD Taking Active Self  aspirin 81 MG EC tablet [id number] No Take 81 mg by mouth daily. [provider] Taking Active Self  atenolol (TENORMIN) 50 MG tablet [id number] No Take 1 tablet (50 mg total) by mouth 2 (two) times daily. [name], MD Taking Active Self  budesonide-formoterol [facility]) 160-4.5 MCG/ACT inhaler [id number] No Inhale 2 puffs into the lungs 2 (two) times daily. [name], MD Taking Active Self           Med Note Blanca Friend, Omer Jack Dec 11, 2021 11:34 PM) Via az&me patient assistance program--escribe to [facility] mail order pharm  busPIRone (BUSPAR) 15 MG tablet [id number] No Take 1 tablet (15 mg total) by mouth 2 (two) times daily. [name], MD Taking Active   calcium acetate (PHOSLO) 667 MG capsule [id number] No Take 667-1,334 mg by mouth See admin instructions. Take 1334 mg with meals 3 times daily and 667 mg with snacks [provider] Taking Active Self  Carboxymethylcellul-Glycerin (LUBRICATING EYE DROPS OP) [id number] No Place 1 drop into both eyes daily as needed (dry eyes). [provider] Taking Active Self  cetirizine (ZYRTEC) 10 MG tablet [id number] No Take 1 tablet (10 mg total) by mouth daily as needed for allergies.  [name], MD Taking Active Self  Continuous Blood Gluc Receiver (FREESTYLE LIBRE 2 READER) Kerrin Mo [id number] No USE TO TEST BLOOD SUGAR Liborio Nixon, MD Taking Active Self  Continuous Blood Gluc Sensor (FREESTYLE LIBRE 2 SENSOR) MISC [id number] No Use multiple times daily to track glucose to prevent highs and lows as a complication of dialysis Dx:Z99.2, E11.59 [name], MD Taking Active Self           Med Note Mickie Hillier Jan 08, 2022 11:49 PM) TOTAL MEDICAL SUPPLY VIA PARACHUTE PORTAL ON 12/2021  gabapentin (NEURONTIN) 400 MG capsule [id number] No Take 1 capsule (400 mg total) by mouth See admin instructions. Take it after HD Barton Dubois, MD Taking Active Self  glucose blood (ONETOUCH ULTRA) test strip [id number] No Use to test blood sugar 3 times daily as directed. DX: E11.9 [name], MD Taking Active Self  Insulin Glargine [facility] KWIKPEN) 100 UNIT/ML [id number] No Inject 25 Units into the skin at bedtime. Add five units each time the fasting is over 125 three days in a row. [name], MD Taking Active Self           Med Note Lavera Guise   Tue Nov 15, 2021 11:45 AM) VIA LILLY CARES PATIENT ASSISTANCE PROGRAM--ESCRIBE CHANGES OR REFILLS TO [facility] [place] PER PROGRAM   montelukast (SINGULAIR) 10 MG tablet [id number] No Take 10 mg by mouth at bedtime. [provider] Taking Active Self  NIFEdipine (PROCARDIA XL/NIFEDICAL XL) 60 MG 24 hr tablet [id number] No TAKE 1 TABLET BY MOUTH ONCE DAILY (ON  NON-DIALYSIS  DAYS) Minus Breeding, MD Taking Active Self  nitroGLYCERIN (NITROSTAT) 0.4 MG SL tablet [id number] No Place 1 tablet (0.4 mg total) under the tongue every 5 (five) minutes as needed for chest pain. Minus Breeding, MD Taking Active Self  omeprazole (PRILOSEC) 20 MG capsule [id number] No Take 1 capsule (20 mg total) by mouth daily. [name], MD Taking Active   ondansetron (ZOFRAN) 4 MG tablet [id number] No TAKE 1  TABLET BY MOUTH EVERY 8 HOURS AS NEEDED FOR NAUSEA AND VOMITING [name], MD Taking Active Self  QUEtiapine (SEROQUEL) 100 MG tablet [id number] No Take 1 tablet (100 mg total) by mouth at bedtime. For sleep and for anxiety [name], MD Taking Active   rOPINIRole (REQUIP) 1 MG tablet [id number] No Take 1 tablet (1 mg total) by mouth at bedtime. For leg cramps [name], MD Taking Active Self  rosuvastatin (CRESTOR) 5 MG tablet [id number] No Take 1 tablet (5 mg total) by mouth daily. For cholesterol [name], MD Taking Active   Semaglutide,0.25 or 0.5MG/DOS, (OZEMPIC, 0.25 OR 0.5 MG/DOSE,) 2 MG/1.5ML SOPN [id number] No Inject 0.25 mg into the skin once a week. 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   Family history of colonic polyps 12/16/2021   Acute CHF (congestive heart failure) ([place]) 12/06/2021   Leukocytosis 12/06/2021   Elevated brain natriuretic peptide (BNP) level 12/06/2021   Elevated troponin 12/06/2021   GERD (gastroesophageal reflux disease) 12/06/2021   Nausea 08/26/2021   Lumbago of lumbar region with sciatica 07/21/2021   Arthropathy of lumbar facet joint 06/27/2021   Radiculopathy, lumbar region 05/05/2021   Chest pain 02/19/2021   Essential hypertension    Mixed hyperlipidemia    Type 2 diabetes mellitus with hyperglycemia ([place])    Hypoalbuminemia    Bruit 01/05/2020   Educated about COVID-19 virus infection 01/05/2020   End stage renal disease on dialysis due to type 2 diabetes mellitus ([place]) 07/21/2019   Lung nodule 05/01/2018   Prolonged Q-T interval on ECG 09/06/2017   Coronary artery disease involving native coronary artery of native heart without angina  pectoris 09/06/2017    GAD (generalized anxiety disorder) 03/04/2016   Leg pain 01/07/2016   Anemia of chronic disease 12/10/2015   Dependence on renal dialysis ([place]) 12/10/2015   ESRD (end stage renal disease) on dialysis ([place]) 12/10/2015   Osteopathy in diseases classified elsewhere, unspecified site 12/10/2015   Constipation 10/25/2015   COPD (chronic obstructive pulmonary disease) ([place]) 03/06/2015   Pericardial effusion 03/06/2015   Chronic diastolic CHF (congestive heart failure) ([place]) 03/05/2015   PAD (peripheral artery disease) ([place]) 04/21/2014   Dyspnea 09/29/2013   Obesity (BMI 30.0-34.9) 12/05/2011   Diverticulosis 03/29/2011   Diabetic neuropathy ([place]) 03/29/2011   Esophageal stricture 03/29/2011   Vitamin D deficiency 03/29/2011   Degenerative joint disease of left shoulder 03/29/2011   Neck pain 03/29/2011   Type II diabetes mellitus with nephropathy ([place]) 04/19/2009   Hyperlipidemia associated with type 2 diabetes mellitus ([place]) 04/19/2009   Hypertension associated with diabetes ([place]) 04/19/2009   Coronary atherosclerosis 04/19/2009    Immunization History  Administered Date(s) Administered   DTaP 07/19/2010   Hepatitis B, adult 12/04/2015, 01/04/2016, 02/08/2016, 06/01/2016, 10/17/2016, 11/11/2016, 12/16/2016, 04/12/2017, 04/08/2019   Influenza Split 10/04/2013, 09/14/2015   Influenza Whole 09/03/2006   Influenza, Seasonal, Injecte, Preservative Fre 08/24/2011, 09/25/2012   Influenza,inj,Quad PF,6+ Mos 09/11/2013, 09/09/2014   Influenza-Unspecified 09/14/2015, 08/25/2016, 08/21/2019, 09/09/2020, 09/15/2021   Moderna SARS-COV2 Booster Vaccination 11/03/2020, 09/30/2021   Moderna Sars-Covid-2 Vaccination 02/09/2020, 03/11/2020   PPD Test 11/23/2015, 12/26/2016, 01/03/2018, 01/09/2019, 04/20/2020, 03/31/2021   Pneumococcal Conjugate-13 07/07/2017   Pneumococcal Polysaccharide-23 08/26/2012, 07/07/2017, 12/13/2018   Pneumococcal-Unspecified 08/26/2012, 07/07/2017   Td 07/19/2010   Tdap  08/24/2011   Zoster Recombinat (Shingrix) 09/09/2019, 11/10/2019    Conditions to be addressed/monitored: DMII and ESRD  Care Plan : PHARMD MEDICATION MANAGEMENT  Updates made by Lavera Guise, RPH since 01/08/2022 12:00 AM     Problem: DISEASE PROGRESSION PREVENTION      Long-Range Goal: T2DM, HLD   Recent Progress: Not on track  Priority: High  Note:   Current Barriers:  Unable to independently afford treatment regimen Unable to maintain control of T2DM, HLD Suboptimal therapeutic regimen for T2DM  Pharmacist Clinical Goal(s):  patient will verbalize ability to afford treatment regimen achieve control of T2DM as evidenced by GOAL A1C, IMPROVED BLOOD SUGARS WITH STEADY CONTROL  through collaboration with PharmD and provider.   Interventions: 1:1 collaboration with [name], MD regarding development and update of comprehensive plan of care as evidenced by provider attestation and co-signature Inter-disciplinary care team collaboration (see longitudinal plan of care) Comprehensive medication review performed; medication list updated in electronic medical record  Diabetes: Goal on Track (progressing): YES. Uncontrolled--A1C 7.9%; current treatment: BASAGLAR 28 UNITS, Ozempic 0.23m-->INCREASE TO 0.5MG SQ WEEKLY NEXT DOSE INTOLERANCES:  DID NOT TOLERATE TRULICITY WELL DUE TO GI (EVEN AT LOWEST DOSE) T2DM REGIMEN LIMITED DUE TO HD TIW INCRESAE TO OZEMPIC 0.5MG SQ WEEKLY  Denies personal and family history of Medullary thyroid cancer (MTC) Sample given TOLERATING LOW DOSE OZEMPIC; OCCASIONALLY USES ZOFRAN FOR NAUSEA, BUT HAS OTHER CAUSES OF NAUSEA AS WELL [place] patient assistance on 12/06/21 (sample given today) LIBRE 2 COVERED UNDER INSURANCE FOR $0 COPAY TOTAL MEDICAL SUPPLY VIA [place] PORTAL TO CHECK ON COVERAGE--cancelled ADS duplicate orders for libre--just total medical supply now [place]2 CGM education provided Patient  to use phone as reader--set up l[place]2 app Left arm (since graft/HD on right side)-replaced sensor Patient RE-enrolled in LAssurantpatient assistance program for  free basgalar (med ships to PCP Office due to patient's HD schedule) Current glucose readings: fasting glucose: <155, post prandial glucose: n/a Reports hyperglycemic symptoms Must follow dialysis diet Current exercise: n/a; HD TIW Recommended continue low dose ozempic; new libre start--patient coming home from the hospital, therefore Elenor [place] was removed--additional sample provided--I must clarify with company which one to proceed with (only one can fill her [place]) Assessed patient finances. Submitted for Ozempic PATIENT ASSISTANCE via novo nordisk; APPLICATION SUBMITTED FOR SYMBICORT TO az&ME PATIENT ASSISTANCE--PATIENT DID NOT LIKE BREZTRI  (received symbicort shipment at our office on 12/2021)  Hyperlipidemia:  Condition stable. Not addressed this visit. Uncontrolled; current treatment:CRESTOR (ROSUVASTATIN 5MG DAILY);  Current dietary patterns: ON HD 3X WEEKLY; NOT HUGE APPETITE, HOWEVER DOES ENJOY SWEETS, OZEMPIC TO ASSIST Recommended HEART HEALTHY DIET, TAKE STATIN AS PRESCRIBED, NEW START OZEMPIC SHOULD LIKELY HELP WITH LIPID PANEL Lipid Panel     Component Value Date/Time   CHOL 203 (H) 01/31/2021 1001   CHOL 241 (H) 03/25/2013 1026   TRIG 240 (H) 01/31/2021 1001   TRIG 147 10/09/2013 1527   TRIG 209 (H) 03/25/2013 1026   HDL 36 (L) 01/31/2021 1001   HDL 65 10/09/2013 1527   HDL 51 03/25/2013 1026   CHOLHDL 5.6 (H) 01/31/2021 1001   CHOLHDL 3.0 06/17/2008 0625   VLDL 19 06/17/2008 0625   LDLCALC 124 (H) 01/31/2021 1001   LDLCALC 125 (H) 10/09/2013 1527   LDLCALC 148 (H) 03/25/2013 1026   LABVLDL 43 (H) 01/31/2021 1001  Patient Goals/Self-Care Activities patient will:  - take medications as prescribed as evidenced by patient report and record review check glucose DAILY OR IF SYMPTOMATIC, document, and provide at  future appointments engage in dietary modifications by HEART HEALTHY/dialysis DIET      Medication Assistance:  novo nordisk for ozempic; lilly cares for basaglar; sybmicort via az&me  Patient's preferred pharmacy is:  [facility] [address] [place] [address] Phone: [phone] Fax: [phone]  [facility] [place], [place] [address] [place] [address] Phone: [phone] Fax: [phone]  [facility] [place], [place] - [address] [place] [address] Phone: [phone] Fax: [phone]  Uses pill box? No - n/a Pt endorses 100% compliance  Follow Up:  Patient agrees to Care Plan and Follow-up.  Plan: Face to Face appointment with care management team member scheduled for: 2 weeks   Regina Eck, PharmD, BCPS Clinical Pharmacist, [place]  II Phone [phone]

## 2021-12-29 DIAGNOSIS — D509 Iron deficiency anemia, unspecified: Secondary | ICD-10-CM | POA: Diagnosis not present

## 2021-12-29 DIAGNOSIS — N2581 Secondary hyperparathyroidism of renal origin: Secondary | ICD-10-CM | POA: Diagnosis not present

## 2021-12-29 DIAGNOSIS — Z992 Dependence on renal dialysis: Secondary | ICD-10-CM | POA: Diagnosis not present

## 2021-12-29 DIAGNOSIS — N186 End stage renal disease: Secondary | ICD-10-CM | POA: Diagnosis not present

## 2021-12-29 DIAGNOSIS — D631 Anemia in chronic kidney disease: Secondary | ICD-10-CM | POA: Diagnosis not present

## 2021-12-31 DIAGNOSIS — Z992 Dependence on renal dialysis: Secondary | ICD-10-CM | POA: Diagnosis not present

## 2021-12-31 DIAGNOSIS — N186 End stage renal disease: Secondary | ICD-10-CM | POA: Diagnosis not present

## 2021-12-31 DIAGNOSIS — D509 Iron deficiency anemia, unspecified: Secondary | ICD-10-CM | POA: Diagnosis not present

## 2021-12-31 DIAGNOSIS — N2581 Secondary hyperparathyroidism of renal origin: Secondary | ICD-10-CM | POA: Diagnosis not present

## 2021-12-31 DIAGNOSIS — D631 Anemia in chronic kidney disease: Secondary | ICD-10-CM | POA: Diagnosis not present

## 2022-01-02 DIAGNOSIS — H524 Presbyopia: Secondary | ICD-10-CM | POA: Diagnosis not present

## 2022-01-02 DIAGNOSIS — H04123 Dry eye syndrome of bilateral lacrimal glands: Secondary | ICD-10-CM | POA: Diagnosis not present

## 2022-01-03 ENCOUNTER — Telehealth: Payer: Self-pay | Admitting: Family Medicine

## 2022-01-03 DIAGNOSIS — D631 Anemia in chronic kidney disease: Secondary | ICD-10-CM | POA: Diagnosis not present

## 2022-01-03 DIAGNOSIS — D509 Iron deficiency anemia, unspecified: Secondary | ICD-10-CM | POA: Diagnosis not present

## 2022-01-03 DIAGNOSIS — N2581 Secondary hyperparathyroidism of renal origin: Secondary | ICD-10-CM | POA: Diagnosis not present

## 2022-01-03 DIAGNOSIS — N186 End stage renal disease: Secondary | ICD-10-CM | POA: Diagnosis not present

## 2022-01-03 DIAGNOSIS — Z992 Dependence on renal dialysis: Secondary | ICD-10-CM | POA: Diagnosis not present

## 2022-01-03 NOTE — Telephone Encounter (Signed)
Pt is calling because she has received supplies from two different places and wants to know if she needs to send any back. Please call back and advise.

## 2022-01-03 NOTE — Telephone Encounter (Signed)
Patient said she had spoken to you about this.  She has gotten her Libre sensors from two different places and has received them again.  She said you were going to let her know if she needed to send anything back or what she need to do to stop duplicate shipments.

## 2022-01-04 NOTE — Telephone Encounter (Signed)
Patient aware.

## 2022-01-04 NOTE — Telephone Encounter (Signed)
Please let patient know I have cancelled her ADVANCED DIABETES SUPPLY future orders  She can keep return supplies to me from advanced diabetes and I will send back to company  She will continue to get libre from [place] only

## 2022-01-04 NOTE — Telephone Encounter (Signed)
Pt checking on this message.

## 2022-01-05 DIAGNOSIS — N2581 Secondary hyperparathyroidism of renal origin: Secondary | ICD-10-CM | POA: Diagnosis not present

## 2022-01-05 DIAGNOSIS — D631 Anemia in chronic kidney disease: Secondary | ICD-10-CM | POA: Diagnosis not present

## 2022-01-05 DIAGNOSIS — N186 End stage renal disease: Secondary | ICD-10-CM | POA: Diagnosis not present

## 2022-01-05 DIAGNOSIS — D509 Iron deficiency anemia, unspecified: Secondary | ICD-10-CM | POA: Diagnosis not present

## 2022-01-05 DIAGNOSIS — Z992 Dependence on renal dialysis: Secondary | ICD-10-CM | POA: Diagnosis not present

## 2022-01-07 DIAGNOSIS — D631 Anemia in chronic kidney disease: Secondary | ICD-10-CM | POA: Diagnosis not present

## 2022-01-07 DIAGNOSIS — D509 Iron deficiency anemia, unspecified: Secondary | ICD-10-CM | POA: Diagnosis not present

## 2022-01-07 DIAGNOSIS — Z992 Dependence on renal dialysis: Secondary | ICD-10-CM | POA: Diagnosis not present

## 2022-01-07 DIAGNOSIS — N2581 Secondary hyperparathyroidism of renal origin: Secondary | ICD-10-CM | POA: Diagnosis not present

## 2022-01-07 DIAGNOSIS — N186 End stage renal disease: Secondary | ICD-10-CM | POA: Diagnosis not present

## 2022-01-08 NOTE — Patient Instructions (Signed)
Visit Information  Following are the goals we discussed today:  Current Barriers:  Unable to independently afford treatment regimen Unable to maintain control of T2DM, HLD Suboptimal therapeutic regimen for T2DM  Pharmacist Clinical Goal(s):  patient will verbalize ability to afford treatment regimen achieve control of T2DM as evidenced by GOAL A1C, IMPROVED BLOOD SUGARS WITH STEADY CONTROL through collaboration with PharmD and provider.   Interventions: 1:1 collaboration with [name], MD regarding development and update of comprehensive plan of care as evidenced by provider attestation and co-signature Inter-disciplinary care team collaboration (see longitudinal plan of care) Comprehensive medication review performed; medication list updated in electronic medical record  Diabetes: Goal on Track (progressing): YES. Uncontrolled--A1C 7.9%; current treatment: BASAGLAR 28 UNITS, Ozempic 0.25mg -->INCREASE TO 0.5MG  SQ WEEKLY NEXT DOSE INTOLERANCES:  DID NOT TOLERATE TRULICITY WELL DUE TO GI (EVEN AT LOWEST DOSE) T2DM REGIMEN LIMITED DUE TO HD TIW INCRESAE TO OZEMPIC 0.5MG  SQ WEEKLY  Denies personal and family history of Medullary thyroid cancer (MTC) Sample given TOLERATING LOW DOSE OZEMPIC; OCCASIONALLY USES ZOFRAN FOR NAUSEA, BUT HAS OTHER CAUSES OF NAUSEA AS WELL Application submitted to [employer] for ozempic patient assistance on 12/06/21 (sample given today) [place] $0 COPAY [place] TO CHECK ON COVERAGE--cancelled ADS duplicate orders for libre--just total medical supply now [place] 2 CGM education provided Patient to use phone as reader--set up [place] 2 app Left arm (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 Submitted for Ozempic PATIENT ASSISTANCE via [employer]; APPLICATION SUBMITTED FOR SYMBICORT TO [facility] PATIENT ASSISTANCE--PATIENT DID NOT LIKE BREZTRI (received symbicort shipment at our office on 12/2021)  Hyperlipidemia:  Condition stable. Not addressed this visit. Uncontrolled; current treatment:CRESTOR (ROSUVASTATIN 5MG  DAILY);  Current dietary patterns: ON HD 3X WEEKLY; NOT HUGE APPETITE, HOWEVER DOES ENJOY SWEETS, OZEMPIC TO ASSIST Recommended HEART HEALTHY DIET, TAKE STATIN AS PRESCRIBED, NEW START OZEMPIC SHOULD LIKELY HELP WITH LIPID PANEL Lipid Panel     Component Value Date/Time   CHOL 203 (H) 01/31/2021 1001   CHOL 241 (H) 03/25/2013 1026   TRIG 240 (H) 01/31/2021 1001   TRIG 147 10/09/2013 1527   TRIG 209 (H) 03/25/2013 1026   HDL 36 (L) 01/31/2021 1001   HDL 65 10/09/2013 1527   HDL 51 03/25/2013 1026   CHOLHDL 5.6 (H) 01/31/2021 1001   CHOLHDL 3.0 06/17/2008 0625   VLDL 19 06/17/2008 0625   LDLCALC 124 (H) 01/31/2021 1001   LDLCALC 125 (H) 10/09/2013 1527   LDLCALC 148 (H) 03/25/2013 1026   LABVLDL 43 (H) 01/31/2021 1001   Patient Goals/Self-Care Activities patient will:  - take medications as prescribed as evidenced by patient report and record review check glucose DAILY OR IF SYMPTOMATIC, document, and provide at future appointments engage in dietary modifications by HEART HEALTHY/dialysis DIET   Plan: Face to Face appointment with care management team member scheduled for: 2 WEEKS  Signature Regina Eck, PharmD, BCPS Clinical Pharmacist, [facility] Phone  [phone]  Please call the care guide team at [phone] if you need to cancel or reschedule your appointment.   Patient verbalizes understanding of instructions and care plan provided today and agrees to view in [place]. Active MyChart status confirmed with patient.

## 2022-01-10 DIAGNOSIS — D509 Iron deficiency anemia, unspecified: Secondary | ICD-10-CM | POA: Diagnosis not present

## 2022-01-10 DIAGNOSIS — N2581 Secondary hyperparathyroidism of renal origin: Secondary | ICD-10-CM | POA: Diagnosis not present

## 2022-01-10 DIAGNOSIS — Z992 Dependence on renal dialysis: Secondary | ICD-10-CM | POA: Diagnosis not present

## 2022-01-10 DIAGNOSIS — N186 End stage renal disease: Secondary | ICD-10-CM | POA: Diagnosis not present

## 2022-01-10 DIAGNOSIS — D631 Anemia in chronic kidney disease: Secondary | ICD-10-CM | POA: Diagnosis not present

## 2022-01-11 ENCOUNTER — Ambulatory Visit (INDEPENDENT_AMBULATORY_CARE_PROVIDER_SITE_OTHER): Payer: Medicare Other | Admitting: *Deleted

## 2022-01-11 ENCOUNTER — Ambulatory Visit: Payer: Medicare Other

## 2022-01-11 DIAGNOSIS — E1165 Type 2 diabetes mellitus with hyperglycemia: Secondary | ICD-10-CM

## 2022-01-11 DIAGNOSIS — Z794 Long term (current) use of insulin: Secondary | ICD-10-CM

## 2022-01-11 NOTE — Progress Notes (Signed)
Patient in today for help changing [place] sensor. Old sensor removed from left upper arm. New sensor applied to left arm. Tolerated well.

## 2022-01-12 DIAGNOSIS — N2581 Secondary hyperparathyroidism of renal origin: Secondary | ICD-10-CM | POA: Diagnosis not present

## 2022-01-12 DIAGNOSIS — D509 Iron deficiency anemia, unspecified: Secondary | ICD-10-CM | POA: Diagnosis not present

## 2022-01-12 DIAGNOSIS — Z992 Dependence on renal dialysis: Secondary | ICD-10-CM | POA: Diagnosis not present

## 2022-01-12 DIAGNOSIS — D631 Anemia in chronic kidney disease: Secondary | ICD-10-CM | POA: Diagnosis not present

## 2022-01-12 DIAGNOSIS — N186 End stage renal disease: Secondary | ICD-10-CM | POA: Diagnosis not present

## 2022-01-14 DIAGNOSIS — D631 Anemia in chronic kidney disease: Secondary | ICD-10-CM | POA: Diagnosis not present

## 2022-01-14 DIAGNOSIS — D509 Iron deficiency anemia, unspecified: Secondary | ICD-10-CM | POA: Diagnosis not present

## 2022-01-14 DIAGNOSIS — N2581 Secondary hyperparathyroidism of renal origin: Secondary | ICD-10-CM | POA: Diagnosis not present

## 2022-01-14 DIAGNOSIS — Z992 Dependence on renal dialysis: Secondary | ICD-10-CM | POA: Diagnosis not present

## 2022-01-14 DIAGNOSIS — N186 End stage renal disease: Secondary | ICD-10-CM | POA: Diagnosis not present

## 2022-01-17 DIAGNOSIS — N186 End stage renal disease: Secondary | ICD-10-CM | POA: Diagnosis not present

## 2022-01-17 DIAGNOSIS — D631 Anemia in chronic kidney disease: Secondary | ICD-10-CM | POA: Diagnosis not present

## 2022-01-17 DIAGNOSIS — D509 Iron deficiency anemia, unspecified: Secondary | ICD-10-CM | POA: Diagnosis not present

## 2022-01-17 DIAGNOSIS — N2581 Secondary hyperparathyroidism of renal origin: Secondary | ICD-10-CM | POA: Diagnosis not present

## 2022-01-17 DIAGNOSIS — Z992 Dependence on renal dialysis: Secondary | ICD-10-CM | POA: Diagnosis not present

## 2022-01-19 DIAGNOSIS — D509 Iron deficiency anemia, unspecified: Secondary | ICD-10-CM | POA: Diagnosis not present

## 2022-01-19 DIAGNOSIS — D631 Anemia in chronic kidney disease: Secondary | ICD-10-CM | POA: Diagnosis not present

## 2022-01-19 DIAGNOSIS — N2581 Secondary hyperparathyroidism of renal origin: Secondary | ICD-10-CM | POA: Diagnosis not present

## 2022-01-19 DIAGNOSIS — N186 End stage renal disease: Secondary | ICD-10-CM | POA: Diagnosis not present

## 2022-01-19 DIAGNOSIS — Z992 Dependence on renal dialysis: Secondary | ICD-10-CM | POA: Diagnosis not present

## 2022-01-21 DIAGNOSIS — N186 End stage renal disease: Secondary | ICD-10-CM | POA: Diagnosis not present

## 2022-01-21 DIAGNOSIS — D631 Anemia in chronic kidney disease: Secondary | ICD-10-CM | POA: Diagnosis not present

## 2022-01-21 DIAGNOSIS — Z992 Dependence on renal dialysis: Secondary | ICD-10-CM | POA: Diagnosis not present

## 2022-01-21 DIAGNOSIS — N2581 Secondary hyperparathyroidism of renal origin: Secondary | ICD-10-CM | POA: Diagnosis not present

## 2022-01-21 DIAGNOSIS — D509 Iron deficiency anemia, unspecified: Secondary | ICD-10-CM | POA: Diagnosis not present

## 2022-01-24 DIAGNOSIS — N186 End stage renal disease: Secondary | ICD-10-CM | POA: Diagnosis not present

## 2022-01-24 DIAGNOSIS — D509 Iron deficiency anemia, unspecified: Secondary | ICD-10-CM | POA: Diagnosis not present

## 2022-01-24 DIAGNOSIS — N2581 Secondary hyperparathyroidism of renal origin: Secondary | ICD-10-CM | POA: Diagnosis not present

## 2022-01-24 DIAGNOSIS — Z992 Dependence on renal dialysis: Secondary | ICD-10-CM | POA: Diagnosis not present

## 2022-01-24 DIAGNOSIS — D631 Anemia in chronic kidney disease: Secondary | ICD-10-CM | POA: Diagnosis not present

## 2022-01-25 ENCOUNTER — Ambulatory Visit: Payer: Medicare Other

## 2022-01-25 ENCOUNTER — Ambulatory Visit: Payer: Medicare Other | Admitting: *Deleted

## 2022-01-25 DIAGNOSIS — E1165 Type 2 diabetes mellitus with hyperglycemia: Secondary | ICD-10-CM

## 2022-01-25 NOTE — Progress Notes (Signed)
Freestyle Libre changed on upper left arm and pt tolerated well.

## 2022-01-26 DIAGNOSIS — D509 Iron deficiency anemia, unspecified: Secondary | ICD-10-CM | POA: Diagnosis not present

## 2022-01-26 DIAGNOSIS — N2581 Secondary hyperparathyroidism of renal origin: Secondary | ICD-10-CM | POA: Diagnosis not present

## 2022-01-26 DIAGNOSIS — N186 End stage renal disease: Secondary | ICD-10-CM | POA: Diagnosis not present

## 2022-01-26 DIAGNOSIS — D631 Anemia in chronic kidney disease: Secondary | ICD-10-CM | POA: Diagnosis not present

## 2022-01-26 DIAGNOSIS — Z992 Dependence on renal dialysis: Secondary | ICD-10-CM | POA: Diagnosis not present

## 2022-01-28 DIAGNOSIS — Z992 Dependence on renal dialysis: Secondary | ICD-10-CM | POA: Diagnosis not present

## 2022-01-28 DIAGNOSIS — D509 Iron deficiency anemia, unspecified: Secondary | ICD-10-CM | POA: Diagnosis not present

## 2022-01-28 DIAGNOSIS — N186 End stage renal disease: Secondary | ICD-10-CM | POA: Diagnosis not present

## 2022-01-28 DIAGNOSIS — N2581 Secondary hyperparathyroidism of renal origin: Secondary | ICD-10-CM | POA: Diagnosis not present

## 2022-01-28 DIAGNOSIS — D631 Anemia in chronic kidney disease: Secondary | ICD-10-CM | POA: Diagnosis not present

## 2022-01-31 DIAGNOSIS — Z992 Dependence on renal dialysis: Secondary | ICD-10-CM | POA: Diagnosis not present

## 2022-01-31 DIAGNOSIS — D631 Anemia in chronic kidney disease: Secondary | ICD-10-CM | POA: Diagnosis not present

## 2022-01-31 DIAGNOSIS — N186 End stage renal disease: Secondary | ICD-10-CM | POA: Diagnosis not present

## 2022-01-31 DIAGNOSIS — N2581 Secondary hyperparathyroidism of renal origin: Secondary | ICD-10-CM | POA: Diagnosis not present

## 2022-01-31 DIAGNOSIS — D509 Iron deficiency anemia, unspecified: Secondary | ICD-10-CM | POA: Diagnosis not present

## 2022-02-02 ENCOUNTER — Telehealth: Payer: Self-pay

## 2022-02-02 DIAGNOSIS — D631 Anemia in chronic kidney disease: Secondary | ICD-10-CM | POA: Diagnosis not present

## 2022-02-02 DIAGNOSIS — N2581 Secondary hyperparathyroidism of renal origin: Secondary | ICD-10-CM | POA: Diagnosis not present

## 2022-02-02 DIAGNOSIS — N186 End stage renal disease: Secondary | ICD-10-CM | POA: Diagnosis not present

## 2022-02-02 DIAGNOSIS — Z992 Dependence on renal dialysis: Secondary | ICD-10-CM | POA: Diagnosis not present

## 2022-02-02 DIAGNOSIS — D509 Iron deficiency anemia, unspecified: Secondary | ICD-10-CM | POA: Diagnosis not present

## 2022-02-02 NOTE — Telephone Encounter (Signed)
Patient notified that her five boxes of Ozempic and two boxes of needles have been received and are being placed in the refrigerator for her to pick up. ?

## 2022-02-03 ENCOUNTER — Telehealth: Payer: Self-pay

## 2022-02-03 NOTE — Telephone Encounter (Signed)
Pt insulin arrived and left message for pt to come and pick them up. Left insulin in fridge.  ?

## 2022-02-04 DIAGNOSIS — D631 Anemia in chronic kidney disease: Secondary | ICD-10-CM | POA: Diagnosis not present

## 2022-02-04 DIAGNOSIS — D509 Iron deficiency anemia, unspecified: Secondary | ICD-10-CM | POA: Diagnosis not present

## 2022-02-04 DIAGNOSIS — N2581 Secondary hyperparathyroidism of renal origin: Secondary | ICD-10-CM | POA: Diagnosis not present

## 2022-02-04 DIAGNOSIS — Z992 Dependence on renal dialysis: Secondary | ICD-10-CM | POA: Diagnosis not present

## 2022-02-04 DIAGNOSIS — N186 End stage renal disease: Secondary | ICD-10-CM | POA: Diagnosis not present

## 2022-02-06 ENCOUNTER — Other Ambulatory Visit (HOSPITAL_COMMUNITY): Payer: Medicare Other

## 2022-02-07 DIAGNOSIS — N2581 Secondary hyperparathyroidism of renal origin: Secondary | ICD-10-CM | POA: Diagnosis not present

## 2022-02-07 DIAGNOSIS — D631 Anemia in chronic kidney disease: Secondary | ICD-10-CM | POA: Diagnosis not present

## 2022-02-07 DIAGNOSIS — Z992 Dependence on renal dialysis: Secondary | ICD-10-CM | POA: Diagnosis not present

## 2022-02-07 DIAGNOSIS — N186 End stage renal disease: Secondary | ICD-10-CM | POA: Diagnosis not present

## 2022-02-07 DIAGNOSIS — D509 Iron deficiency anemia, unspecified: Secondary | ICD-10-CM | POA: Diagnosis not present

## 2022-02-08 ENCOUNTER — Ambulatory Visit (INDEPENDENT_AMBULATORY_CARE_PROVIDER_SITE_OTHER): Payer: Medicare Other | Admitting: *Deleted

## 2022-02-08 DIAGNOSIS — E1165 Type 2 diabetes mellitus with hyperglycemia: Secondary | ICD-10-CM

## 2022-02-08 DIAGNOSIS — Z794 Long term (current) use of insulin: Secondary | ICD-10-CM

## 2022-02-08 MED ORDER — CETIRIZINE HCL 10 MG PO TABS: 10.0000 mg | ORAL_TABLET | Freq: Every day | ORAL | 1 refills | Status: DC | PRN

## 2022-02-08 NOTE — Progress Notes (Signed)
Libre sensor changed for patient  ?

## 2022-02-09 ENCOUNTER — Encounter: Payer: Self-pay | Admitting: Family Medicine

## 2022-02-09 ENCOUNTER — Ambulatory Visit (INDEPENDENT_AMBULATORY_CARE_PROVIDER_SITE_OTHER): Payer: Medicare Other | Admitting: Family Medicine

## 2022-02-09 VITALS — BP 177/80 | HR 93 | Temp 98.1°F | Ht 63.0 in | Wt 190.0 lb

## 2022-02-09 DIAGNOSIS — N2581 Secondary hyperparathyroidism of renal origin: Secondary | ICD-10-CM | POA: Diagnosis not present

## 2022-02-09 DIAGNOSIS — Z992 Dependence on renal dialysis: Secondary | ICD-10-CM | POA: Diagnosis not present

## 2022-02-09 DIAGNOSIS — J441 Chronic obstructive pulmonary disease with (acute) exacerbation: Secondary | ICD-10-CM

## 2022-02-09 DIAGNOSIS — D509 Iron deficiency anemia, unspecified: Secondary | ICD-10-CM | POA: Diagnosis not present

## 2022-02-09 DIAGNOSIS — D631 Anemia in chronic kidney disease: Secondary | ICD-10-CM | POA: Diagnosis not present

## 2022-02-09 DIAGNOSIS — N186 End stage renal disease: Secondary | ICD-10-CM | POA: Diagnosis not present

## 2022-02-09 MED ORDER — DOXYCYCLINE HYCLATE 100 MG PO TABS: 100.0000 mg | ORAL_TABLET | Freq: Two times a day (BID) | ORAL | 0 refills | Status: AC

## 2022-02-09 MED ORDER — METHYLPREDNISOLONE 4 MG PO TBPK: ORAL_TABLET | ORAL | 0 refills | Status: DC

## 2022-02-09 MED ORDER — METHYLPREDNISOLONE ACETATE 80 MG/ML IJ SUSP
60.0000 mg | Freq: Once | INTRAMUSCULAR | Status: AC
  Administered 2022-02-09: 17:00:00 60 mg via INTRAMUSCULAR

## 2022-02-09 MED ORDER — METHYLPREDNISOLONE ACETATE 40 MG/ML IJ SUSP: 60.0000 mg | Freq: Once | INTRAMUSCULAR | Status: DC

## 2022-02-09 NOTE — Progress Notes (Signed)
? ?Assessment & Plan:  ?1. COPD with acute exacerbation ([place]) ?Education provided on COPD exacerbations. Encouraged patient to continue monitoring her oxygen saturation and if she finds she is staying <88% she needs to go to the ER.  ?- doxycycline (VIBRA-TABS) 100 MG tablet; Take 1 tablet (100 mg total) by mouth 2 (two) times daily for 7 days.  Dispense: 14 tablet; Refill: 0 ?- methylPREDNISolone (MEDROL DOSEPAK) 4 MG TBPK tablet; Use as directed  Dispense: 21 each; Refill: 0 ?- methylPREDNISolone acetate (DEPO-MEDROL) injection 60 mg ? ? ?Follow up plan: Return if symptoms worsen or fail to improve. ? ?Hendricks Limes, MSN, APRN, FNP-C ?[place] ? ?Subjective:  ? ?Patient ID: [name], female    DOB: [birth year], 71 y.o.   MRN: [id number] ? ?HPI: ?[name] is a 71 y.o. female presenting on 02/09/2022 for Shortness of Breath (Patient states that she has COPD and states that her SOB got worse last night. Oxygen was at 84 last night. ) ? ?Patient complains of cough, shortness of breath, and wheezing. Cough is productive. Shortness of breath and wheezing are worse than her usual. She reports her oxygen saturation dropped to 84% last night. Onset of symptoms was 5 days ago, gradually worsening since that time. She is drinking plenty of fluids. Evaluation to date: none. Treatment to date:  she has been using her Albuterol inhaler/nebulizer and Symbicort as prescribed . She has a history of COPD. She does not smoke.  ? ? ?ROS: Negative unless specifically indicated above in HPI.  ? ?Relevant past medical history reviewed and updated as indicated.  ? ?Allergies and medications reviewed and updated. ? ? ?Current Outpatient Medications:  ?  acetaminophen (TYLENOL) 500 MG tablet, Take 1,000 mg by mouth every 6 (six) hours as needed for moderate pain or headache., Disp: , Rfl:  ?  albuterol (PROVENTIL) (2.5 MG/3ML) 0.083% nebulizer solution, Take 3 mLs (2.5 mg total) by nebulization every 6  (six) hours as needed for wheezing or shortness of breath., Disp: 75 mL, Rfl: 12 ?  albuterol (VENTOLIN HFA) 108 (90 Base) MCG/ACT inhaler, Inhale 2 puffs into the lungs every 6 (six) hours as needed for wheezing or shortness of breath., Disp: 1 each, Rfl: 2 ?  aspirin 81 MG EC tablet, Take 81 mg by mouth daily., Disp: , Rfl:  ?  atenolol (TENORMIN) 50 MG tablet, Take 1 tablet (50 mg total) by mouth 2 (two) times daily., Disp: 180 tablet, Rfl: 3 ?  budesonide-formoterol (SYMBICORT) 160-4.5 MCG/ACT inhaler, Inhale 2 puffs into the lungs 2 (two) times daily., Disp: 3 each, Rfl: 4 ?  busPIRone (BUSPAR) 15 MG tablet, Take 1 tablet (15 mg total) by mouth 2 (two) times daily., Disp: 180 tablet, Rfl: 1 ?  calcium acetate (PHOSLO) 667 MG capsule, Take 667-1,334 mg by mouth See admin instructions. Take 1334 mg with meals 3 times daily and 667 mg with snacks, Disp: , Rfl:  ?  Carboxymethylcellul-Glycerin (LUBRICATING EYE DROPS OP), Place 1 drop into both eyes daily as needed (dry eyes)., Disp: , Rfl:  ?  cetirizine (ZYRTEC) 10 MG tablet, Take 1 tablet (10 mg total) by mouth daily as needed for allergies., Disp: 90 tablet, Rfl: 1 ?  Continuous Blood Gluc Receiver (FREESTYLE LIBRE 2 READER) DEVI, USE TO TEST BLOOD SUGAR CONTINUOUSLY, Disp: 1 each, Rfl: 2 ?  Continuous Blood Gluc Sensor (FREESTYLE LIBRE 2 SENSOR) MISC, Use multiple times daily to track glucose to prevent highs and lows as a  complication of dialysis Dx:Z99.2, E11.59, Disp: 2 each, Rfl: 12 ?  gabapentin (NEURONTIN) 400 MG capsule, Take 1 capsule (400 mg total) by mouth See admin instructions. Take it after HD, Disp: 30 capsule, Rfl: 1 ?  glucose blood (ONETOUCH ULTRA) test strip, Use to test blood sugar 3 times daily as directed. DX: E11.9, Disp: 300 each, Rfl: 12 ?  Insulin Glargine (BASAGLAR KWIKPEN) 100 UNIT/ML, Inject 25 Units into the skin at bedtime. Add five units each time the fasting is over 125 three days in a row., Disp: 45 mL, Rfl: 11 ?  montelukast  (SINGULAIR) 10 MG tablet, Take 10 mg by mouth at bedtime., Disp: , Rfl:  ?  NIFEdipine (PROCARDIA XL/NIFEDICAL XL) 60 MG 24 hr tablet, TAKE 1 TABLET BY MOUTH ONCE DAILY (ON  NON-DIALYSIS  DAYS), Disp: 90 tablet, Rfl: 3 ?  nitroGLYCERIN (NITROSTAT) 0.4 MG SL tablet, Place 1 tablet (0.4 mg total) under the tongue every 5 (five) minutes as needed for chest pain., Disp: 25 tablet, Rfl: 11 ?  omeprazole (PRILOSEC) 20 MG capsule, Take 1 capsule (20 mg total) by mouth daily., Disp: 90 capsule, Rfl: 3 ?  ondansetron (ZOFRAN) 4 MG tablet, TAKE 1 TABLET BY MOUTH EVERY 8 HOURS AS NEEDED FOR NAUSEA AND VOMITING, Disp: 20 tablet, Rfl: 0 ?  QUEtiapine (SEROQUEL) 100 MG tablet, Take 1 tablet (100 mg total) by mouth at bedtime. For sleep and for anxiety, Disp: 90 tablet, Rfl: 1 ?  rOPINIRole (REQUIP) 1 MG tablet, Take 1 tablet (1 mg total) by mouth at bedtime. For leg cramps, Disp: 90 tablet, Rfl: 3 ?  rosuvastatin (CRESTOR) 5 MG tablet, Take 1 tablet (5 mg total) by mouth daily. For cholesterol, Disp: 90 tablet, Rfl: 2 ?  Semaglutide,0.25 or 0.5MG /DOS, (OZEMPIC, 0.25 OR 0.5 MG/DOSE,) 2 MG/1.5ML SOPN, Inject 0.25 mg into the skin once a week. Friday, Disp: , Rfl:  ?  torsemide (DEMADEX) 20 MG tablet, Take 1 tablet by mouth daily., Disp: , Rfl:  ? ?Allergies  ?Allergen Reactions  ? Azithromycin   ?  Prolonged QT  ? Ace Inhibitors Cough  ? Clopidogrel Bisulfate Other (See Comments)  ?  Sick, Headache, "Felt terrible"  ? Codeine Other (See Comments)  ?  hallucinations  ? Lisinopril Cough  ? Penicillins Other (See Comments)  ?  Unknown reaction ?Did it involve swelling of the face/tongue/throat, SOB, or low BP? Unknown ?Did it involve sudden or severe rash/hives, skin peeling, or any reaction on the inside of your mouth or nose? Unknown ?Did you need to seek medical attention at a hospital or doctor's office? Unknown ?When did it last happen?      childhood allergy ?If all above answers are "NO", may proceed with cephalosporin use. ? ?   ? ? ?Objective:  ? ?BP (!) 177/80   Pulse 93   Temp 98.1 ?F (36.7 ?C) (Temporal)   Ht 5\' 3"  (1.6 m)   Wt 190 lb (86.2 kg)   SpO2 92%   BMI 33.66 kg/m?   ? ?Physical Exam ?Vitals reviewed.  ?Constitutional:   ?   General: She is not in acute distress. ?   Appearance: Normal appearance. She is not ill-appearing, toxic-appearing or diaphoretic.  ?HENT:  ?   Head: Normocephalic and atraumatic.  ?Eyes:  ?   General: No scleral icterus.    ?   Right eye: No discharge.     ?   Left eye: No discharge.  ?   Conjunctiva/sclera: Conjunctivae normal.  ?  Cardiovascular:  ?   Rate and Rhythm: Normal rate and regular rhythm.  ?   Heart sounds: Normal heart sounds. No murmur heard. ?  No friction rub. No gallop.  ?Pulmonary:  ?   Effort: Pulmonary effort is normal. No respiratory distress.  ?   Breath sounds: No stridor. Examination of the left-lower field reveals wheezing. Wheezing present. No rhonchi or rales.  ?Musculoskeletal:     ?   General: Normal range of motion.  ?   Cervical back: Normal range of motion.  ?Skin: ?   General: Skin is warm and dry.  ?   Capillary Refill: Capillary refill takes less than 2 seconds.  ?Neurological:  ?   General: No focal deficit present.  ?   Mental Status: She is alert and oriented to person, place, and time. Mental status is at baseline.  ?Psychiatric:     ?   Mood and Affect: Mood normal.     ?   Behavior: Behavior normal.     ?   Thought Content: Thought content normal.     ?   Judgment: Judgment normal.  ? ? ? ? ? ? ?

## 2022-02-11 DIAGNOSIS — N186 End stage renal disease: Secondary | ICD-10-CM | POA: Diagnosis not present

## 2022-02-11 DIAGNOSIS — N2581 Secondary hyperparathyroidism of renal origin: Secondary | ICD-10-CM | POA: Diagnosis not present

## 2022-02-11 DIAGNOSIS — Z992 Dependence on renal dialysis: Secondary | ICD-10-CM | POA: Diagnosis not present

## 2022-02-11 DIAGNOSIS — D631 Anemia in chronic kidney disease: Secondary | ICD-10-CM | POA: Diagnosis not present

## 2022-02-11 DIAGNOSIS — D509 Iron deficiency anemia, unspecified: Secondary | ICD-10-CM | POA: Diagnosis not present

## 2022-02-14 DIAGNOSIS — Z992 Dependence on renal dialysis: Secondary | ICD-10-CM | POA: Diagnosis not present

## 2022-02-14 DIAGNOSIS — D631 Anemia in chronic kidney disease: Secondary | ICD-10-CM | POA: Diagnosis not present

## 2022-02-14 DIAGNOSIS — N186 End stage renal disease: Secondary | ICD-10-CM | POA: Diagnosis not present

## 2022-02-14 DIAGNOSIS — N2581 Secondary hyperparathyroidism of renal origin: Secondary | ICD-10-CM | POA: Diagnosis not present

## 2022-02-14 DIAGNOSIS — D509 Iron deficiency anemia, unspecified: Secondary | ICD-10-CM | POA: Diagnosis not present

## 2022-02-15 ENCOUNTER — Ambulatory Visit (INDEPENDENT_AMBULATORY_CARE_PROVIDER_SITE_OTHER): Payer: Medicare Other | Admitting: Family Medicine

## 2022-02-15 ENCOUNTER — Encounter: Payer: Self-pay | Admitting: Family Medicine

## 2022-02-15 VITALS — BP 157/81 | HR 84 | Temp 97.0°F | Resp 20 | Ht 63.0 in | Wt 190.0 lb

## 2022-02-15 DIAGNOSIS — R0602 Shortness of breath: Secondary | ICD-10-CM

## 2022-02-15 DIAGNOSIS — R079 Chest pain, unspecified: Secondary | ICD-10-CM

## 2022-02-15 MED ORDER — FAMOTIDINE 20 MG PO TABS: 20.0000 mg | ORAL_TABLET | Freq: Two times a day (BID) | ORAL | 0 refills | Status: DC

## 2022-02-15 NOTE — Progress Notes (Signed)
? ?BP (!) 157/81   Pulse 84   Temp (!) 97 ?F (36.1 ?C) (Oral)   Resp 20   Ht 5\' 3"  (1.6 m)   Wt 190 lb (86.2 kg)   SpO2 98%   BMI 33.66 kg/m?   ? ?Subjective:  ? ?Patient ID: [name], female    DOB: [birth year], 71 y.o.   MRN: [id number] ? ?HPI: ?[name] is a 71 y.o. female presenting on 02/15/2022 for Chest Pain ? ? ?HPI ?Patient is coming in today complaining of chest tightness and pressure and difficulty swallowing and difficulty breathing.  She says this all started yesterday after dialysis but she has had this previously, she had this a few times going on since January over the past 2 or 3 months.  She had an echocardiogram in January.  She also does have an appoint with cardiology next week.  She does have a history of COPD and CHF.  Her weight today is 189.4.  Which is actually down from her normal weight.  She did do a course of prednisone and was treated like a COPD exacerbation started last week and she still is on that prednisone. ? ?Relevant past medical, surgical, family and social history reviewed and updated as indicated. Interim medical history since our last visit reviewed. ?Allergies and medications reviewed and updated. ? ?Review of Systems  ?Constitutional:  Negative for chills and fever.  ?HENT:  Positive for trouble swallowing. Negative for congestion, ear discharge and ear pain.   ?Eyes:  Negative for redness and visual disturbance.  ?Respiratory:  Positive for chest tightness and shortness of breath.   ?Cardiovascular:  Negative for chest pain and leg swelling.  ?Gastrointestinal:  Negative for abdominal pain, constipation and diarrhea.  ?Genitourinary:  Negative for difficulty urinating and dysuria.  ?Musculoskeletal:  Negative for back pain and gait problem.  ?Skin:  Negative for rash.  ?Neurological:  Negative for light-headedness and headaches.  ?Psychiatric/Behavioral:  Negative for agitation and behavioral problems.   ?All other systems reviewed and are  negative. ? ?Per HPI unless specifically indicated above ? ? ?Allergies as of 02/15/2022   ? ?   Reactions  ? Azithromycin   ? Prolonged QT  ? Ace Inhibitors Cough  ? Clopidogrel Bisulfate Other (See Comments)  ? Sick, Headache, "Felt terrible"  ? Codeine Other (See Comments)  ? hallucinations  ? Lisinopril Cough  ? Penicillins Other (See Comments)  ? Unknown reaction ?Did it involve swelling of the face/tongue/throat, SOB, or low BP? Unknown ?Did it involve sudden or severe rash/hives, skin peeling, or any reaction on the inside of your mouth or nose? Unknown ?Did you need to seek medical attention at a hospital or doctor's office? Unknown ?When did it last happen?      childhood allergy ?If all above answers are "NO", may proceed with cephalosporin use.  ? ?  ? ?  ?Medication List  ?  ? ?  ? Accurate as of February 15, 2022 12:20 PM. If you have any questions, ask your nurse or doctor.  ?  ?  ? ?  ? ?STOP taking these medications   ? ?methylPREDNISolone 4 MG Tbpk tablet ?Commonly known as: MEDROL DOSEPAK ?Stopped by: Worthy Rancher, MD ?  ? ?  ? ?TAKE these medications   ? ?acetaminophen 500 MG tablet ?Commonly known as: TYLENOL ?Take 1,000 mg by mouth every 6 (six) hours as needed for moderate pain or headache. ?  ?albuterol 108 (90 Base) MCG/ACT  inhaler ?Commonly known as: VENTOLIN HFA ?Inhale 2 puffs into the lungs every 6 (six) hours as needed for wheezing or shortness of breath. ?  ?albuterol (2.5 MG/3ML) 0.083% nebulizer solution ?Commonly known as: PROVENTIL ?Take 3 mLs (2.5 mg total) by nebulization every 6 (six) hours as needed for wheezing or shortness of breath. ?  ?aspirin 81 MG EC tablet ?Take 81 mg by mouth daily. ?  ?atenolol 50 MG tablet ?Commonly known as: TENORMIN ?Take 1 tablet (50 mg total) by mouth 2 (two) times daily. ?  ?Basaglar KwikPen 100 UNIT/ML ?Inject 25 Units into the skin at bedtime. Add five units each time the fasting is over 125 three days in a row. ?  ?budesonide-formoterol  160-4.5 MCG/ACT inhaler ?Commonly known as: SYMBICORT ?Inhale 2 puffs into the lungs 2 (two) times daily. ?  ?busPIRone 15 MG tablet ?Commonly known as: BUSPAR ?Take 1 tablet (15 mg total) by mouth 2 (two) times daily. ?  ?calcium acetate 667 MG capsule ?Commonly known as: PHOSLO ?Take 667-1,334 mg by mouth See admin instructions. Take 1334 mg with meals 3 times daily and 667 mg with snacks ?  ?cetirizine 10 MG tablet ?Commonly known as: ZYRTEC ?Take 1 tablet (10 mg total) by mouth daily as needed for allergies. ?  ?doxycycline 100 MG tablet ?Commonly known as: VIBRA-TABS ?Take 1 tablet (100 mg total) by mouth 2 (two) times daily for 7 days. ?  ?famotidine 20 MG tablet ?Commonly known as: PEPCID ?Take 1 tablet (20 mg total) by mouth 2 (two) times daily. ?Started by: Worthy Rancher, MD ?  ?FreeStyle Libre 2 Reader [place] Mo ?USE TO TEST BLOOD SUGAR CONTINUOUSLY ?  ?FreeStyle Libre 2 Sensor Misc ?Use multiple times daily to track glucose to prevent highs and lows as a complication of dialysis Dx:Z99.2, E11.59 ?  ?gabapentin 400 MG capsule ?Commonly known as: NEURONTIN ?Take 1 capsule (400 mg total) by mouth See admin instructions. Take it after HD ?  ?LUBRICATING EYE DROPS OP ?Place 1 drop into both eyes daily as needed (dry eyes). ?  ?montelukast 10 MG tablet ?Commonly known as: SINGULAIR ?Take 10 mg by mouth at bedtime. ?  ?NIFEdipine 60 MG 24 hr tablet ?Commonly known as: PROCARDIA XL/NIFEDICAL XL ?TAKE 1 TABLET BY MOUTH ONCE DAILY (ON  NON-DIALYSIS  DAYS) ?  ?nitroGLYCERIN 0.4 MG SL tablet ?Commonly known as: NITROSTAT ?Place 1 tablet (0.4 mg total) under the tongue every 5 (five) minutes as needed for chest pain. ?  ?omeprazole 20 MG capsule ?Commonly known as: PRILOSEC ?Take 1 capsule (20 mg total) by mouth daily. ?  ?ondansetron 4 MG tablet ?Commonly known as: ZOFRAN ?TAKE 1 TABLET BY MOUTH EVERY 8 HOURS AS NEEDED FOR NAUSEA AND VOMITING ?  ?OneTouch Ultra test strip ?Generic drug: glucose blood ?Use to test  blood sugar 3 times daily as directed. DX: E11.9 ?  ?Ozempic (0.25 or 0.5 MG/DOSE) 2 MG/1.5ML Sopn ?Generic drug: Semaglutide(0.25 or 0.5MG /DOS) ?Inject 0.25 mg into the skin once a week. Friday ?  ?QUEtiapine 100 MG tablet ?Commonly known as: SEROQUEL ?Take 1 tablet (100 mg total) by mouth at bedtime. For sleep and for anxiety ?  ?rOPINIRole 1 MG tablet ?Commonly known as: REQUIP ?Take 1 tablet (1 mg total) by mouth at bedtime. For leg cramps ?  ?rosuvastatin 5 MG tablet ?Commonly known as: Crestor ?Take 1 tablet (5 mg total) by mouth daily. For cholesterol ?  ?torsemide 20 MG tablet ?Commonly known as: DEMADEX ?Take 1 tablet by mouth daily. ?  ? ?  ? ? ? ?  Objective:  ? ?BP (!) 157/81   Pulse 84   Temp (!) 97 ?F (36.1 ?C) (Oral)   Resp 20   Ht 5\' 3"  (1.6 m)   Wt 190 lb (86.2 kg)   SpO2 98%   BMI 33.66 kg/m?   ?Wt Readings from Last 3 Encounters:  ?02/15/22 190 lb (86.2 kg)  ?02/09/22 190 lb (86.2 kg)  ?12/16/21 190 lb (86.2 kg)  ?  ?Physical Exam ?Vitals and nursing note reviewed.  ?Constitutional:   ?   General: She is not in acute distress. ?   Appearance: She is well-developed. She is not diaphoretic.  ?Eyes:  ?   Conjunctiva/sclera: Conjunctivae normal.  ?Cardiovascular:  ?   Rate and Rhythm: Normal rate and regular rhythm.  ?   Heart sounds: Normal heart sounds. No murmur heard. ?Pulmonary:  ?   Effort: Pulmonary effort is normal. No respiratory distress.  ?   Breath sounds: Normal breath sounds. No wheezing.  ?Abdominal:  ?   General: Abdomen is flat. Bowel sounds are normal. There is no distension.  ?   Tenderness: There is no abdominal tenderness. There is no guarding or rebound.  ?Musculoskeletal:     ?   General: No swelling or tenderness. Normal range of motion.  ?Skin: ?   General: Skin is warm and dry.  ?   Findings: No rash.  ?Neurological:  ?   Mental Status: She is alert and oriented to person, place, and time.  ?   Coordination: Coordination normal.  ?Psychiatric:     ?   Behavior: Behavior  normal.  ? ? ?EKG: Sinus rhythm with occasional PVC ? ?Assessment & Plan:  ? ?Problem List Items Addressed This Visit   ? ?  ? Other  ? Dyspnea  ? Relevant Medications  ? famotidine (PEPCID) 20 MG tablet  ? Other Relevant Orders  ? EKG

## 2022-02-16 DIAGNOSIS — Z992 Dependence on renal dialysis: Secondary | ICD-10-CM | POA: Diagnosis not present

## 2022-02-16 DIAGNOSIS — D509 Iron deficiency anemia, unspecified: Secondary | ICD-10-CM | POA: Diagnosis not present

## 2022-02-16 DIAGNOSIS — D631 Anemia in chronic kidney disease: Secondary | ICD-10-CM | POA: Diagnosis not present

## 2022-02-16 DIAGNOSIS — N186 End stage renal disease: Secondary | ICD-10-CM | POA: Diagnosis not present

## 2022-02-16 DIAGNOSIS — N2581 Secondary hyperparathyroidism of renal origin: Secondary | ICD-10-CM | POA: Diagnosis not present

## 2022-02-18 DIAGNOSIS — D631 Anemia in chronic kidney disease: Secondary | ICD-10-CM | POA: Diagnosis not present

## 2022-02-18 DIAGNOSIS — D509 Iron deficiency anemia, unspecified: Secondary | ICD-10-CM | POA: Diagnosis not present

## 2022-02-18 DIAGNOSIS — N186 End stage renal disease: Secondary | ICD-10-CM | POA: Diagnosis not present

## 2022-02-18 DIAGNOSIS — N2581 Secondary hyperparathyroidism of renal origin: Secondary | ICD-10-CM | POA: Diagnosis not present

## 2022-02-18 DIAGNOSIS — Z992 Dependence on renal dialysis: Secondary | ICD-10-CM | POA: Diagnosis not present

## 2022-02-20 NOTE — Progress Notes (Signed)
?  ?Cardiology Office Note ? ? ?Date:  02/22/2022  ? ?ID:  [name], DOB 24-Apr-1951, MRN [id number] ? ?PCP:  [name], MD  ?Cardiologist:   Minus Breeding, MD ? ? ?Chief Complaint  ?Patient presents with  ? Palpitations  ? ? ? ? ?  ?History of Present Illness: ?[name] is a 71 y.o. female who presents for follow up of CAD and diastolic heart failure with diastolic dysfunction.   She was in the hospital in March 2022 with chest pain with non diagnostic troponin.  It was thought to be atypical and she was managed conservatively.   She was in the hospital with acute SOB in Jan.  I reviewed these records for this visit.   She was treated with dialysis.   ? ?She has had some fullness in her throat recently and saw Dr Dettinger who treated her with Pepcid.  She thinks this is helping.  She has continued to have chest discomfort only on dialysis.  He had this for a long time and she had a negative perfusion study at the end of 2021.  He does not think the symptoms have changed since then.  Sometimes he has to get oxygen with dialysis.  It goes away when dialysis stops and when she gets the oxygen.  She does not have it when she is otherwise out and about and doing her activities of daily living. ? ?She does describe palpitations.  She says her heart starts racing.  She said it does not often show that it is irregular or racing when she is on dialysis.  She has a watch that can take her heart rate.  She says it sometimes shows it to be very elevated but the monitor at dialysis will say it does not.  We checked it with a pulse ox today and it seems to correlate with the pulse ox reading.    She does not have presyncope or syncope.  Seems to happen at rest.  This is not when she is having the chest discomfort. ? ? ?Past Medical History:  ?Diagnosis Date  ? Acute on chronic diastolic CHF (congestive heart failure) ([place]) 03/05/2015  ? Grade 2. EF 60-65%  ? Anemia   ? hx of  ? Anxiety   ? Arthritis   ? CAD  (coronary artery disease)   ? s/p Stenting in 2005;  [place] 7/09: Vigorous LV function, 2-3+ MR, RI 40%, RI stent patent, proximal-mid RCA 40-50%;  Echo 7/09:  EF 55-65%, trivial MR;  [facility] 11/18/12: EF 66%, normal LV wall motion, mild to moderate anterior ischemia - reviewed by [facility] and felt to rep breast atten (low risk);  Echo 6/14: mild LVH, EF 55-65%, Gr 2 DD, PASP 44, trivial eff   ? Chronic kidney disease   ? CREATININE IS UP--GOING TO KIDNEY MD   ? Chronic neck pain   ? COPD (chronic obstructive pulmonary disease) ([place])   ? Degenerative joint disease   ? left shoulder  ? Diabetic neuropathy ([place])   ? Diverticulosis   ? Esophageal stricture   ? GERD (gastroesophageal reflux disease)   ? Hyperlipidemia   ? Hypertension   ? IDDM (insulin dependent diabetes mellitus)   ? Pericardial effusion 03/06/2015  ? Very small per echo ; pleural nodules seen on CT chest.  ? Peripheral vascular disease ([place])   ? Stroke [facility])   ? "mini- years ago"  ? Vitamin D deficiency   ? ? ?Past Surgical History:  ?  Procedure Laterality Date  ? A/V FISTULAGRAM N/A 08/10/2017  ? Procedure: A/V Fistulagram - Right Arm;  Surgeon: Elam Dutch, MD;  Location: [place] [facility];  Service: Cardiovascular;  Laterality: N/A;  ? A/V FISTULAGRAM N/A 03/03/2020  ? Procedure: A/V FISTULAGRAM - Right Arm;  Surgeon: Marty Heck, MD;  Location: [place] [facility];  Service: Cardiovascular;  Laterality: N/A;  ? ABDOMINAL HYSTERECTOMY    ? AV FISTULA PLACEMENT Right 07/20/2015  ? Procedure: RADIAL CEPHALIC ARTERIOVENOUS FISTULA CREATION RIGHT ARM;  Surgeon: Angelia Mould, MD;  Location: [place];  Service: Vascular;  Laterality: Right;  ? AV FISTULA PLACEMENT Right 11/26/2015  ? Procedure:  Creation of RIGHT BRACHIO-CEPHALIC arteriovenous fistula;  Surgeon: Angelia Mould, MD;  Location: [place];  Service: Vascular;  Laterality: Right;  ? BACK SURGERY    ? 2007 & 2013  ? BLADDER SURGERY    ? Tack  ? CARDIAC CATHETERIZATION  2003, 2005,  2009  ? 1 stent  ? cardiac stents    ? CATARACT EXTRACTION W/PHACO Right 05/04/2014  ? Procedure: CATARACT EXTRACTION PHACO AND INTRAOCULAR LENS PLACEMENT (IOC);  Surgeon: Williams Che, MD;  Location: [facility];  Service: Ophthalmology;  Laterality: Right;  CDE 1.47  ? CATARACT EXTRACTION W/PHACO Left 07/20/2014  ? Procedure: CATARACT EXTRACTION WITH PHACO AND INTRAOCULAR LENS PLACEMENT LEFT EYE CDE=1.79;  Surgeon: Williams Che, MD;  Location: [facility];  Service: Ophthalmology;  Laterality: Left;  ? CERVICAL SPINE SURGERY  2012  ? 8 screws  ? CYSTOSCOPY WITH INJECTION N/A 06/03/2013  ? Procedure: MACROPLASTIQUE  INJECTION ;  Surgeon: Malka So, MD;  Location: [facility];  Service: Urology;  Laterality: N/A;  ? FISTULOGRAM Right 10/20/2016  ? Procedure: FISTULOGRAM WITH POSSIBLE INTERVENTION;  Surgeon: [name], MD;  Location: [facility];  Service: Vascular;  Laterality: Right;  ? INSERTION OF DIALYSIS CATHETER N/A 11/26/2015  ? Procedure: INSERTION OF DIALYSIS CATHETER;  Surgeon: Angelia Mould, MD;  Location: [place];  Service: Vascular;  Laterality: N/A;  ? LAMINECTOMY  12/29/2011  ? LUMBAR LAMINECTOMY/DECOMPRESSION MICRODISCECTOMY  12/29/2011  ? Procedure: LUMBAR LAMINECTOMY/DECOMPRESSION MICRODISCECTOMY;  Surgeon: Floyce Stakes, MD;  Location: [place] [facility];  Service: Neurosurgery;  Laterality: N/A;  Lumbar Two through Lumbar Five Laminectomies/Cellsaver  ? PARTIAL HYSTERECTOMY    ? PERIPHERAL VASCULAR BALLOON ANGIOPLASTY  03/03/2020  ? Procedure: PERIPHERAL VASCULAR BALLOON ANGIOPLASTY;  Surgeon: Marty Heck, MD;  Location: [place] [facility];  Service: Cardiovascular;;  Rt arm fistula  ? PERIPHERAL VASCULAR BALLOON ANGIOPLASTY Right 04/15/2021  ? Procedure: PERIPHERAL VASCULAR BALLOON ANGIOPLASTY;  Surgeon: Angelia Mould, MD;  Location: [place] [facility];  Service: Cardiovascular;  Laterality: Right;  arm fistula  ? PERIPHERAL VASCULAR CATHETERIZATION N/A 11/22/2015  ? Procedure:  Fistulagram;  Surgeon: Angelia Mould, MD;  Location: [place] [facility];  Service: Cardiovascular;  Laterality: N/A;  ? PERIPHERAL VASCULAR INTERVENTION  08/10/2017  ? Procedure: PERIPHERAL VASCULAR INTERVENTION;  Surgeon: Elam Dutch, MD;  Location: [place] [facility];  Service: Cardiovascular;;  ? SHOULDER SURGERY Right   ? Rotator cuff  ? ? ? ?Current Outpatient Medications  ?Medication Sig Dispense Refill  ? acetaminophen (TYLENOL) 500 MG tablet Take 1,000 mg by mouth every 6 (six) hours as needed for moderate pain or headache.    ? albuterol (PROVENTIL) (2.5 MG/3ML) 0.083% nebulizer solution Take 3 mLs (2.5 mg total) by nebulization every 6 (six) hours as needed for wheezing  or shortness of breath. 75 mL 12  ? albuterol (VENTOLIN HFA) 108 (90 Base) MCG/ACT inhaler Inhale 2 puffs into the lungs every 6 (six) hours as needed for wheezing or shortness of breath. 1 each 2  ? aspirin 81 MG EC tablet Take 81 mg by mouth daily.    ? atenolol (TENORMIN) 50 MG tablet Take 1 tablet (50 mg total) by mouth 2 (two) times daily. 180 tablet 3  ? budesonide-formoterol (SYMBICORT) 160-4.5 MCG/ACT inhaler Inhale 2 puffs into the lungs 2 (two) times daily. 3 each 4  ? busPIRone (BUSPAR) 15 MG tablet Take 1 tablet (15 mg total) by mouth 2 (two) times daily. 180 tablet 1  ? calcium acetate (PHOSLO) 667 MG capsule Take 667-1,334 mg by mouth See admin instructions. Take 1334 mg with meals 3 times daily and 667 mg with snacks    ? Carboxymethylcellul-Glycerin (LUBRICATING EYE DROPS OP) Place 1 drop into both eyes daily as needed (dry eyes).    ? cetirizine (ZYRTEC) 10 MG tablet Take 1 tablet (10 mg total) by mouth daily as needed for allergies. 90 tablet 1  ? famotidine (PEPCID) 20 MG tablet Take 1 tablet (20 mg total) by mouth 2 (two) times daily. 30 tablet 0  ? gabapentin (NEURONTIN) 400 MG capsule Take 1 capsule (400 mg total) by mouth See admin instructions. Take it after HD 30 capsule 1  ? Insulin Glargine (BASAGLAR  KWIKPEN) 100 UNIT/ML Inject 25 Units into the skin at bedtime. Add five units each time the fasting is over 125 three days in a row. 45 mL 11  ? NIFEdipine (PROCARDIA XL/NIFEDICAL XL) 60 MG 24 hr tablet TAKE 1 TABLE

## 2022-02-21 DIAGNOSIS — Z992 Dependence on renal dialysis: Secondary | ICD-10-CM | POA: Diagnosis not present

## 2022-02-21 DIAGNOSIS — N2581 Secondary hyperparathyroidism of renal origin: Secondary | ICD-10-CM | POA: Diagnosis not present

## 2022-02-21 DIAGNOSIS — D509 Iron deficiency anemia, unspecified: Secondary | ICD-10-CM | POA: Diagnosis not present

## 2022-02-21 DIAGNOSIS — D631 Anemia in chronic kidney disease: Secondary | ICD-10-CM | POA: Diagnosis not present

## 2022-02-21 DIAGNOSIS — N186 End stage renal disease: Secondary | ICD-10-CM | POA: Diagnosis not present

## 2022-02-22 ENCOUNTER — Ambulatory Visit (INDEPENDENT_AMBULATORY_CARE_PROVIDER_SITE_OTHER): Payer: Medicare Other | Admitting: Cardiology

## 2022-02-22 ENCOUNTER — Encounter: Payer: Self-pay | Admitting: Cardiology

## 2022-02-22 ENCOUNTER — Other Ambulatory Visit: Payer: Self-pay

## 2022-02-22 VITALS — BP 142/68 | HR 68 | Ht 63.0 in | Wt 189.0 lb

## 2022-02-22 DIAGNOSIS — R072 Precordial pain: Secondary | ICD-10-CM

## 2022-02-22 DIAGNOSIS — I5032 Chronic diastolic (congestive) heart failure: Secondary | ICD-10-CM | POA: Diagnosis not present

## 2022-02-22 DIAGNOSIS — R002 Palpitations: Secondary | ICD-10-CM | POA: Diagnosis not present

## 2022-02-22 DIAGNOSIS — I2510 Atherosclerotic heart disease of native coronary artery without angina pectoris: Secondary | ICD-10-CM

## 2022-02-22 DIAGNOSIS — R0602 Shortness of breath: Secondary | ICD-10-CM

## 2022-02-22 NOTE — Patient Instructions (Signed)
Medication Instructions:  ?The current medical regimen is effective;  continue present plan and medications. ? ?*If you need a refill on your cardiac medications before your next appointment, please call your pharmacy* ? ?Testing/Procedures: ?Your physician has recommended that you wear an event monitor for 30 days. Event monitors are medical devices that record the heart?s electrical activity. Doctors most often [place] these monitors to diagnose arrhythmias. Arrhythmias are problems with the speed or rhythm of the heartbeat. The monitor is a small, portable device. You can wear one while you do your normal daily activities. This is usually used to diagnose what is causing palpitations/syncope (passing out). ? ?Follow-Up: ?At [facility], you and your health needs are our priority.  As part of our continuing mission to provide you with exceptional heart care, we have created designated Provider Care Teams.  These Care Teams include your primary Cardiologist (physician) and Advanced Practice Providers (APPs -  Physician Assistants and Nurse Practitioners) who all work together to provide you with the care you need, when you need it. ? ?We recommend signing up for the patient portal called "MyChart".  Sign up information is provided on this After Visit Summary.  MyChart is used to connect with patients for Virtual Visits (Telemedicine).  Patients are able to view lab/test results, encounter notes, upcoming appointments, etc.  Non-urgent messages can be sent to your provider as well.   ?To learn more about what you can do with MyChart, go to [email or web address].   ? ?Your next appointment:   ?6 month(s) ? ?The format for your next appointment:   ?In Person ? ?Provider:   ?Minus Breeding, MD  ? ?Thank you for choosing [facility]!! ? ? ? ?

## 2022-02-23 DIAGNOSIS — D631 Anemia in chronic kidney disease: Secondary | ICD-10-CM | POA: Diagnosis not present

## 2022-02-23 DIAGNOSIS — D509 Iron deficiency anemia, unspecified: Secondary | ICD-10-CM | POA: Diagnosis not present

## 2022-02-23 DIAGNOSIS — Z992 Dependence on renal dialysis: Secondary | ICD-10-CM | POA: Diagnosis not present

## 2022-02-23 DIAGNOSIS — N2581 Secondary hyperparathyroidism of renal origin: Secondary | ICD-10-CM | POA: Diagnosis not present

## 2022-02-23 DIAGNOSIS — N186 End stage renal disease: Secondary | ICD-10-CM | POA: Diagnosis not present

## 2022-02-25 DIAGNOSIS — N186 End stage renal disease: Secondary | ICD-10-CM | POA: Diagnosis not present

## 2022-02-25 DIAGNOSIS — Z992 Dependence on renal dialysis: Secondary | ICD-10-CM | POA: Diagnosis not present

## 2022-02-25 DIAGNOSIS — D631 Anemia in chronic kidney disease: Secondary | ICD-10-CM | POA: Diagnosis not present

## 2022-02-25 DIAGNOSIS — D509 Iron deficiency anemia, unspecified: Secondary | ICD-10-CM | POA: Diagnosis not present

## 2022-02-25 DIAGNOSIS — N2581 Secondary hyperparathyroidism of renal origin: Secondary | ICD-10-CM | POA: Diagnosis not present

## 2022-02-28 ENCOUNTER — Emergency Department (HOSPITAL_COMMUNITY)
Admission: EM | Admit: 2022-02-28 | Discharge: 2022-03-01 | Disposition: A | Discharge status: Home / Self Care | Payer: Medicare Other | Attending: Emergency Medicine | Admitting: Emergency Medicine

## 2022-02-28 ENCOUNTER — Encounter (HOSPITAL_COMMUNITY): Payer: Self-pay | Admitting: *Deleted

## 2022-02-28 ENCOUNTER — Emergency Department (HOSPITAL_COMMUNITY): Payer: Medicare Other

## 2022-02-28 ENCOUNTER — Other Ambulatory Visit: Payer: Self-pay

## 2022-02-28 DIAGNOSIS — D631 Anemia in chronic kidney disease: Secondary | ICD-10-CM | POA: Diagnosis not present

## 2022-02-28 DIAGNOSIS — Z794 Long term (current) use of insulin: Secondary | ICD-10-CM | POA: Diagnosis not present

## 2022-02-28 DIAGNOSIS — R0602 Shortness of breath: Secondary | ICD-10-CM | POA: Diagnosis not present

## 2022-02-28 DIAGNOSIS — I2510 Atherosclerotic heart disease of native coronary artery without angina pectoris: Secondary | ICD-10-CM | POA: Insufficient documentation

## 2022-02-28 DIAGNOSIS — D509 Iron deficiency anemia, unspecified: Secondary | ICD-10-CM | POA: Diagnosis not present

## 2022-02-28 DIAGNOSIS — Z7951 Long term (current) use of inhaled steroids: Secondary | ICD-10-CM | POA: Insufficient documentation

## 2022-02-28 DIAGNOSIS — Z992 Dependence on renal dialysis: Secondary | ICD-10-CM | POA: Diagnosis not present

## 2022-02-28 DIAGNOSIS — I509 Heart failure, unspecified: Secondary | ICD-10-CM | POA: Insufficient documentation

## 2022-02-28 DIAGNOSIS — I132 Hypertensive heart and chronic kidney disease with heart failure and with stage 5 chronic kidney disease, or end stage renal disease: Secondary | ICD-10-CM | POA: Insufficient documentation

## 2022-02-28 DIAGNOSIS — R7989 Other specified abnormal findings of blood chemistry: Secondary | ICD-10-CM | POA: Diagnosis not present

## 2022-02-28 DIAGNOSIS — N186 End stage renal disease: Secondary | ICD-10-CM | POA: Diagnosis not present

## 2022-02-28 DIAGNOSIS — J441 Chronic obstructive pulmonary disease with (acute) exacerbation: Secondary | ICD-10-CM | POA: Insufficient documentation

## 2022-02-28 DIAGNOSIS — Z7982 Long term (current) use of aspirin: Secondary | ICD-10-CM | POA: Diagnosis not present

## 2022-02-28 DIAGNOSIS — E1122 Type 2 diabetes mellitus with diabetic chronic kidney disease: Secondary | ICD-10-CM | POA: Diagnosis not present

## 2022-02-28 DIAGNOSIS — Z79899 Other long term (current) drug therapy: Secondary | ICD-10-CM | POA: Diagnosis not present

## 2022-02-28 DIAGNOSIS — E119 Type 2 diabetes mellitus without complications: Secondary | ICD-10-CM | POA: Diagnosis not present

## 2022-02-28 DIAGNOSIS — N2581 Secondary hyperparathyroidism of renal origin: Secondary | ICD-10-CM | POA: Diagnosis not present

## 2022-02-28 DIAGNOSIS — D72829 Elevated white blood cell count, unspecified: Secondary | ICD-10-CM | POA: Diagnosis not present

## 2022-02-28 MED ORDER — PREDNISONE 50 MG PO TABS
60.0000 mg | ORAL_TABLET | Freq: Once | ORAL | Status: AC
  Administered 2022-03-01: 60 mg via ORAL
  Filled 2022-02-28 (×2): qty 1

## 2022-02-28 MED ORDER — IPRATROPIUM-ALBUTEROL 0.5-2.5 (3) MG/3ML IN SOLN
3.0000 mL | Freq: Once | RESPIRATORY_TRACT | Status: AC
  Administered 2022-03-01: 3 mL via RESPIRATORY_TRACT
  Filled 2022-02-28: qty 3

## 2022-02-28 NOTE — ED Triage Notes (Signed)
Pt with SOB starting tonight.  Continued feeling of her throat closing in, has been seen for it multiple times.   ?

## 2022-02-28 NOTE — ED Notes (Signed)
Pt is a dialysis pt and had it done earlier today.  ?

## 2022-02-28 NOTE — ED Provider Notes (Signed)
?[place] ?Provider Note ? ? ?CSN: [id number] ?Arrival date & time: 02/28/22  2231 ? ?  ? ?History ? ?Chief Complaint  ?Patient presents with  ? Shortness of Breath  ? ? ?[name] is a 71 y.o. female. ? ?The history is provided by the patient.  ?Shortness of Breath ?Severity:  Moderate ?Onset quality:  Gradual ?Timing:  Constant ?Progression:  Worsening ?Chronicity:  Recurrent ?Worsened by:  Nothing ?Associated symptoms: cough   ?Associated symptoms: no fever, no hemoptysis and no vomiting   ?Associated symptoms comment:  Chest pressure ?Patient with extensive history including CHF, COPD, ESRD presents with shortness of breath.  She reports she had a normal session of dialysis earlier and felt well.  However several hours later she began having increasing cough and shortness of breath.  She feels at times that her throat is closing in.  She also reports chest pressure and tightness ?She has had this previously ?She is not on home oxygen but uses it frequently during dialysis.  She is a non-smoker ? ?  ? ?Home Medications ?Prior to Admission medications   ?Medication Sig Start Date End Date Taking? Authorizing Provider  ?predniSONE (DELTASONE) 50 MG tablet Take 1 tablet (50 mg total) by mouth daily. 03/01/22  Yes Ripley Fraise, MD  ?acetaminophen (TYLENOL) 500 MG tablet Take 1,000 mg by mouth every 6 (six) hours as needed for moderate pain or headache.    [provider]  ?albuterol (PROVENTIL) (2.5 MG/3ML) 0.083% nebulizer solution Take 3 mLs (2.5 mg total) by nebulization every 6 (six) hours as needed for wheezing or shortness of breath. 12/02/21   [name], MD  ?albuterol (VENTOLIN HFA) 108 (90 Base) MCG/ACT inhaler Inhale 2 puffs into the lungs every 6 (six) hours as needed for wheezing or shortness of breath. 12/02/21   [name], MD  ?aspirin 81 MG EC tablet Take 81 mg by mouth daily.    [provider]  ?atenolol (TENORMIN) 50 MG tablet Take 1 tablet  (50 mg total) by mouth 2 (two) times daily. 09/15/21   [name], MD  ?budesonide-formoterol (SYMBICORT) 160-4.5 MCG/ACT inhaler Inhale 2 puffs into the lungs 2 (two) times daily. 11/15/21   [name], MD  ?busPIRone (BUSPAR) 15 MG tablet Take 1 tablet (15 mg total) by mouth 2 (two) times daily. 12/12/21   [name], MD  ?calcium acetate (PHOSLO) 667 MG capsule Take 667-1,334 mg by mouth See admin instructions. Take 1334 mg with meals 3 times daily and 667 mg with snacks    [provider]  ?Carboxymethylcellul-Glycerin (LUBRICATING EYE DROPS OP) Place 1 drop into both eyes daily as needed (dry eyes).    [provider]  ?cetirizine (ZYRTEC) 10 MG tablet Take 1 tablet (10 mg total) by mouth daily as needed for allergies. 02/08/22   [name], MD  ?Continuous Blood Gluc Receiver (FREESTYLE LIBRE 2 READER) DEVI USE TO TEST BLOOD SUGAR CONTINUOUSLY 11/15/21   [name], MD  ?Continuous Blood Gluc Sensor (FREESTYLE LIBRE 2 SENSOR) MISC Use multiple times daily to track glucose to prevent highs and lows as a complication of dialysis Dx:Z99.2, E11.59 09/15/21   [name], MD  ?famotidine (PEPCID) 20 MG tablet Take 1 tablet (20 mg total) by mouth 2 (two) times daily. 02/15/22   Dettinger, Fransisca Kaufmann, MD  ?gabapentin (NEURONTIN) 400 MG capsule Take 1 capsule (400 mg total) by mouth See admin instructions. Take it after HD 02/21/21   Barton Dubois, MD  ?glucose  blood (ONETOUCH ULTRA) test strip Use to test blood sugar 3 times daily as directed. DX: E11.9 09/13/20   [name], MD  ?Insulin Glargine [facility]) 100 UNIT/ML Inject 25 Units into the skin at bedtime. Add five units each time the fasting is over 125 three days in a row. 11/15/21   [name], MD  ?montelukast (SINGULAIR) 10 MG tablet Take 10 mg by mouth at bedtime. ?Patient not taking: Reported on 02/22/2022 06/26/21   [provider]  ?NIFEdipine (PROCARDIA XL/NIFEDICAL XL) 60 MG 24 hr tablet TAKE 1  TABLET BY MOUTH ONCE DAILY (ON  NON-DIALYSIS  DAYS) 11/09/21   Minus Breeding, MD  ?nitroGLYCERIN (NITROSTAT) 0.4 MG SL tablet Place 1 tablet (0.4 mg total) under the tongue every 5 (five) minutes as needed for chest pain. 11/09/21   Minus Breeding, MD  ?omeprazole (PRILOSEC) 20 MG capsule Take 1 capsule (20 mg total) by mouth daily. 12/12/21   [name], MD  ?ondansetron (ZOFRAN) 4 MG tablet TAKE 1 TABLET BY MOUTH EVERY 8 HOURS AS NEEDED FOR NAUSEA AND VOMITING 11/10/21   [name], MD  ?QUEtiapine (SEROQUEL) 100 MG tablet Take 1 tablet (100 mg total) by mouth at bedtime. For sleep and for anxiety 12/12/21   [name], MD  ?rOPINIRole (REQUIP) 1 MG tablet Take 1 tablet (1 mg total) by mouth at bedtime. For leg cramps ?Patient not taking: Reported on 02/22/2022 09/15/21   [name], MD  ?rosuvastatin (CRESTOR) 5 MG tablet Take 1 tablet (5 mg total) by mouth daily. For cholesterol 12/12/21   [name], MD  ?Semaglutide,0.25 or 0.5MG /DOS, (OZEMPIC, 0.25 OR 0.5 MG/DOSE,) 2 MG/1.5ML SOPN Inject 0.25 mg into the skin once a week. Friday    [provider]  ?torsemide (DEMADEX) 20 MG tablet Take 1 tablet by mouth daily. 01/19/22   [provider]  ?   ? ?Allergies    ?Azithromycin, Ace inhibitors, Clopidogrel bisulfate, Codeine, Lisinopril, and Penicillins   ? ?Review of Systems   ?Review of Systems  ?Constitutional:  Negative for fever.  ?Respiratory:  Positive for cough and shortness of breath. Negative for hemoptysis.   ?Cardiovascular:  Negative for leg swelling.  ?Gastrointestinal:  Negative for vomiting.  ? ?Physical Exam ?Updated Vital Signs ?BP (!) 196/90   Pulse 83   Temp 97.8 ?F (36.6 ?C) (Oral)   Resp 16   Ht 1.6 m (5\' 3" )   Wt 83.9 kg   SpO2 97%   BMI 32.77 kg/m?  ?Physical Exam ?CONSTITUTIONAL: Chronically ill-appearing, no acute distress ?HEAD: Normocephalic/atraumatic ?EYES: EOMI/PERRL ?ENMT: Mucous membranes moist, uvula midline, no edema, no stridor or  drooling ?NECK: supple no meningeal signs ?SPINE/BACK:entire spine nontender ?CV: S1/S2 noted, no murmurs/rubs/gallops noted ?LUNGS: Decreased breath sounds bilaterally, wheezing noted bilaterally, no distress, on nasal cannula ?ABDOMEN: soft, nontender, no rebound or guarding, bowel sounds noted throughout abdomen ?GU:no cva tenderness ?NEURO: Pt is awake/alert/appropriate, moves all extremitiesx4.  No facial droop.   ?EXTREMITIES: pulses normal/equal, full ROM, dialysis access to right arm with thrill noted.  No lower extremity edema ?SKIN: warm, color normal ?PSYCH: no abnormalities of mood noted, alert and oriented to situation ? ?ED Results / Procedures / Treatments   ?Labs ?(all labs ordered are listed, but only abnormal results are displayed) ?Labs Reviewed  ?BASIC METABOLIC PANEL - Abnormal; Notable for the following components:  ?    Result Value  ? Potassium 3.4 (*)   ? Chloride 93 (*)   ? Glucose, Bld 190 (*)   ?  BUN 35 (*)   ? Creatinine, Ser 7.85 (*)   ? GFR, Estimated 5 (*)   ? All other components within normal limits  ?CBC WITH DIFFERENTIAL/PLATELET - Abnormal; Notable for the following components:  ? WBC 11.7 (*)   ? Hemoglobin 11.5 (*)   ? HCT 35.5 (*)   ? RDW 17.8 (*)   ? Neutro Abs 8.0 (*)   ? Eosinophils Absolute 1.0 (*)   ? All other components within normal limits  ?BRAIN NATRIURETIC PEPTIDE - Abnormal; Notable for the following components:  ? B Natriuretic Peptide 1,695.0 (*)   ? All other components within normal limits  ?TROPONIN I (HIGH SENSITIVITY) - Abnormal; Notable for the following components:  ? Troponin I (High Sensitivity) 70 (*)   ? All other components within normal limits  ? ? ?EKG ?EKG Interpretation ? ?Date/Time:  Tuesday February 28 2022 22:40:03 EDT ?Ventricular Rate:  90 ?PR Interval:  160 ?QRS Duration: 96 ?QT Interval:  382 ?QTC Calculation: 467 ?R Axis:   4 ?Text Interpretation: Normal sinus rhythm with sinus arrhythmia Septal infarct (cited on or before 20-Feb-2021)  Lateral infarct (cited on or before 20-Feb-2021) Abnormal ECG Confirmed by Ripley Fraise [phone]) on 02/28/2022 11:35:37 PM ? ?Radiology ?DG Chest Portable 1 View ? ?Result Date: 02/28/2022 ?CLINICAL DATA:  Shortness of breat

## 2022-03-01 ENCOUNTER — Ambulatory Visit (INDEPENDENT_AMBULATORY_CARE_PROVIDER_SITE_OTHER): Payer: Medicare Other | Admitting: Nurse Practitioner

## 2022-03-01 ENCOUNTER — Encounter: Payer: Self-pay | Admitting: Nurse Practitioner

## 2022-03-01 VITALS — BP 160/74 | HR 92 | Temp 98.5°F | Ht 63.0 in | Wt 188.0 lb

## 2022-03-01 DIAGNOSIS — Z23 Encounter for immunization: Secondary | ICD-10-CM | POA: Diagnosis not present

## 2022-03-01 DIAGNOSIS — E1122 Type 2 diabetes mellitus with diabetic chronic kidney disease: Secondary | ICD-10-CM | POA: Diagnosis not present

## 2022-03-01 DIAGNOSIS — R0602 Shortness of breath: Secondary | ICD-10-CM | POA: Diagnosis not present

## 2022-03-01 DIAGNOSIS — K219 Gastro-esophageal reflux disease without esophagitis: Secondary | ICD-10-CM | POA: Diagnosis not present

## 2022-03-01 DIAGNOSIS — F411 Generalized anxiety disorder: Secondary | ICD-10-CM

## 2022-03-01 DIAGNOSIS — J449 Chronic obstructive pulmonary disease, unspecified: Secondary | ICD-10-CM | POA: Diagnosis not present

## 2022-03-01 DIAGNOSIS — I70201 Unspecified atherosclerosis of native arteries of extremities, right leg: Secondary | ICD-10-CM | POA: Diagnosis present

## 2022-03-01 DIAGNOSIS — E1151 Type 2 diabetes mellitus with diabetic peripheral angiopathy without gangrene: Secondary | ICD-10-CM | POA: Diagnosis present

## 2022-03-01 DIAGNOSIS — T82838A Hemorrhage of vascular prosthetic devices, implants and grafts, initial encounter: Secondary | ICD-10-CM | POA: Diagnosis not present

## 2022-03-01 DIAGNOSIS — Z95820 Peripheral vascular angioplasty status with implants and grafts: Secondary | ICD-10-CM | POA: Diagnosis not present

## 2022-03-01 DIAGNOSIS — I5032 Chronic diastolic (congestive) heart failure: Secondary | ICD-10-CM | POA: Diagnosis not present

## 2022-03-01 DIAGNOSIS — D509 Iron deficiency anemia, unspecified: Secondary | ICD-10-CM | POA: Diagnosis not present

## 2022-03-01 DIAGNOSIS — I200 Unstable angina: Secondary | ICD-10-CM | POA: Diagnosis not present

## 2022-03-01 DIAGNOSIS — D72829 Elevated white blood cell count, unspecified: Secondary | ICD-10-CM | POA: Diagnosis not present

## 2022-03-01 DIAGNOSIS — I953 Hypotension of hemodialysis: Secondary | ICD-10-CM | POA: Diagnosis not present

## 2022-03-01 DIAGNOSIS — I517 Cardiomegaly: Secondary | ICD-10-CM | POA: Diagnosis not present

## 2022-03-01 DIAGNOSIS — I25110 Atherosclerotic heart disease of native coronary artery with unstable angina pectoris: Secondary | ICD-10-CM | POA: Diagnosis not present

## 2022-03-01 DIAGNOSIS — E1121 Type 2 diabetes mellitus with diabetic nephropathy: Secondary | ICD-10-CM | POA: Diagnosis not present

## 2022-03-01 DIAGNOSIS — I2510 Atherosclerotic heart disease of native coronary artery without angina pectoris: Secondary | ICD-10-CM | POA: Diagnosis not present

## 2022-03-01 DIAGNOSIS — T82318A Breakdown (mechanical) of other vascular grafts, initial encounter: Secondary | ICD-10-CM | POA: Diagnosis not present

## 2022-03-01 DIAGNOSIS — I132 Hypertensive heart and chronic kidney disease with heart failure and with stage 5 chronic kidney disease, or end stage renal disease: Secondary | ICD-10-CM | POA: Diagnosis not present

## 2022-03-01 DIAGNOSIS — D631 Anemia in chronic kidney disease: Secondary | ICD-10-CM | POA: Diagnosis not present

## 2022-03-01 DIAGNOSIS — E559 Vitamin D deficiency, unspecified: Secondary | ICD-10-CM | POA: Diagnosis present

## 2022-03-01 DIAGNOSIS — I358 Other nonrheumatic aortic valve disorders: Secondary | ICD-10-CM | POA: Diagnosis present

## 2022-03-01 DIAGNOSIS — R079 Chest pain, unspecified: Secondary | ICD-10-CM | POA: Diagnosis not present

## 2022-03-01 DIAGNOSIS — I214 Non-ST elevation (NSTEMI) myocardial infarction: Secondary | ICD-10-CM | POA: Diagnosis not present

## 2022-03-01 DIAGNOSIS — R5381 Other malaise: Secondary | ICD-10-CM | POA: Diagnosis not present

## 2022-03-01 DIAGNOSIS — I509 Heart failure, unspecified: Secondary | ICD-10-CM | POA: Diagnosis not present

## 2022-03-01 DIAGNOSIS — N2581 Secondary hyperparathyroidism of renal origin: Secondary | ICD-10-CM | POA: Diagnosis not present

## 2022-03-01 DIAGNOSIS — I152 Hypertension secondary to endocrine disorders: Secondary | ICD-10-CM | POA: Diagnosis not present

## 2022-03-01 DIAGNOSIS — E1169 Type 2 diabetes mellitus with other specified complication: Secondary | ICD-10-CM | POA: Diagnosis not present

## 2022-03-01 DIAGNOSIS — I480 Paroxysmal atrial fibrillation: Secondary | ICD-10-CM | POA: Diagnosis not present

## 2022-03-01 DIAGNOSIS — D649 Anemia, unspecified: Secondary | ICD-10-CM | POA: Diagnosis not present

## 2022-03-01 DIAGNOSIS — N186 End stage renal disease: Secondary | ICD-10-CM | POA: Diagnosis not present

## 2022-03-01 DIAGNOSIS — I272 Pulmonary hypertension, unspecified: Secondary | ICD-10-CM | POA: Diagnosis not present

## 2022-03-01 DIAGNOSIS — I120 Hypertensive chronic kidney disease with stage 5 chronic kidney disease or end stage renal disease: Secondary | ICD-10-CM | POA: Diagnosis not present

## 2022-03-01 DIAGNOSIS — R778 Other specified abnormalities of plasma proteins: Secondary | ICD-10-CM | POA: Diagnosis not present

## 2022-03-01 DIAGNOSIS — N250 Renal osteodystrophy: Secondary | ICD-10-CM | POA: Diagnosis not present

## 2022-03-01 DIAGNOSIS — E876 Hypokalemia: Secondary | ICD-10-CM | POA: Diagnosis not present

## 2022-03-01 DIAGNOSIS — Z992 Dependence on renal dialysis: Secondary | ICD-10-CM | POA: Diagnosis not present

## 2022-03-01 DIAGNOSIS — I4891 Unspecified atrial fibrillation: Secondary | ICD-10-CM | POA: Diagnosis not present

## 2022-03-01 DIAGNOSIS — E871 Hypo-osmolality and hyponatremia: Secondary | ICD-10-CM | POA: Diagnosis not present

## 2022-03-01 DIAGNOSIS — E1159 Type 2 diabetes mellitus with other circulatory complications: Secondary | ICD-10-CM | POA: Diagnosis not present

## 2022-03-01 DIAGNOSIS — K921 Melena: Secondary | ICD-10-CM | POA: Diagnosis not present

## 2022-03-01 DIAGNOSIS — E669 Obesity, unspecified: Secondary | ICD-10-CM | POA: Diagnosis not present

## 2022-03-01 DIAGNOSIS — I5033 Acute on chronic diastolic (congestive) heart failure: Secondary | ICD-10-CM | POA: Diagnosis not present

## 2022-03-01 DIAGNOSIS — E1140 Type 2 diabetes mellitus with diabetic neuropathy, unspecified: Secondary | ICD-10-CM | POA: Diagnosis present

## 2022-03-01 DIAGNOSIS — Z20822 Contact with and (suspected) exposure to covid-19: Secondary | ICD-10-CM | POA: Diagnosis not present

## 2022-03-01 DIAGNOSIS — E785 Hyperlipidemia, unspecified: Secondary | ICD-10-CM | POA: Diagnosis not present

## 2022-03-01 LAB — CBC WITH DIFFERENTIAL/PLATELET
Abs Immature Granulocytes: 0.04 10*3/uL (ref 0.00–0.07)
Basophils Absolute: 0 10*3/uL (ref 0.0–0.1)
Basophils Relative: 0 %
Eosinophils Absolute: 1 10*3/uL — ABNORMAL HIGH (ref 0.0–0.5)
Eosinophils Relative: 8 %
HCT: 35.5 % — ABNORMAL LOW (ref 36.0–46.0)
Hemoglobin: 11.5 g/dL — ABNORMAL LOW (ref 12.0–15.0)
Immature Granulocytes: 0 %
Lymphocytes Relative: 16 %
Lymphs Abs: 1.9 10*3/uL (ref 0.7–4.0)
MCH: 28.2 pg (ref 26.0–34.0)
MCHC: 32.4 g/dL (ref 30.0–36.0)
MCV: 87 fL (ref 80.0–100.0)
Monocytes Absolute: 0.9 10*3/uL (ref 0.1–1.0)
Monocytes Relative: 7 %
Neutro Abs: 8 10*3/uL — ABNORMAL HIGH (ref 1.7–7.7)
Neutrophils Relative %: 69 %
Platelets: 216 10*3/uL (ref 150–400)
RBC: 4.08 MIL/uL (ref 3.87–5.11)
RDW: 17.8 % — ABNORMAL HIGH (ref 11.5–15.5)
WBC: 11.7 10*3/uL — ABNORMAL HIGH (ref 4.0–10.5)
nRBC: 0 % (ref 0.0–0.2)

## 2022-03-01 LAB — BASIC METABOLIC PANEL
Anion gap: 14 (ref 5–15)
BUN: 35 mg/dL — ABNORMAL HIGH (ref 8–23)
CO2: 28 mmol/L (ref 22–32)
Calcium: 8.9 mg/dL (ref 8.9–10.3)
Chloride: 93 mmol/L — ABNORMAL LOW (ref 98–111)
Creatinine, Ser: 7.85 mg/dL — ABNORMAL HIGH (ref 0.44–1.00)
GFR, Estimated: 5 mL/min — ABNORMAL LOW (ref >60)
Glucose, Bld: 190 mg/dL — ABNORMAL HIGH (ref 70–99)
Potassium: 3.4 mmol/L — ABNORMAL LOW (ref 3.5–5.1)
Sodium: 135 mmol/L (ref 135–145)

## 2022-03-01 LAB — BRAIN NATRIURETIC PEPTIDE: B Natriuretic Peptide: 1695 pg/mL — ABNORMAL HIGH (ref 0.0–100.0)

## 2022-03-01 LAB — TROPONIN I (HIGH SENSITIVITY): Troponin I (High Sensitivity): 70 ng/L — ABNORMAL HIGH (ref <18)

## 2022-03-01 MED ORDER — PREDNISONE 50 MG PO TABS: 50.0000 mg | ORAL_TABLET | Freq: Every day | ORAL | 0 refills | Status: DC

## 2022-03-01 MED ORDER — HYDROXYZINE HCL 10 MG PO TABS: 10.0000 mg | ORAL_TABLET | Freq: Three times a day (TID) | ORAL | 0 refills | Status: DC | PRN

## 2022-03-01 NOTE — Progress Notes (Signed)
? ?Acute Office Visit ? ?Subjective:  ? ? Patient ID: [name], female    DOB: 08-13-1951, 71 y.o.   MRN: [id number] ? ?Chief Complaint  ?Patient presents with  ? Shortness of Breath  ?  Anxiety? Patient concerned  ? ? ?Shortness of Breath ?This is a new problem. The current episode started yesterday. The problem occurs every few minutes. The problem has been unchanged. Pertinent negatives include no abdominal pain or rash. Nothing aggravates the symptoms. She has tried steroid inhalers for the symptoms. The treatment provided moderate relief.  ?Patient is in today for Anxiety, Follow-up ? ?She was last seen for anxiety few weeks ago. ?Changes made at last visit include BUSPAR 15 mg tablet by mouth Twice daily.. ?  ?She reports good compliance with treatment. ?She reports good tolerance of treatment. ?She is not having side effects.  ? ?She feels her anxiety is moderate and Unchanged since last visit. ? ?Symptoms: ?No chest pain No difficulty concentrating  ?No dizziness Yes fatigue  ?Yes feelings of losing control Yes insomnia  ?No irritable No palpitations  ?Yes panic attacks No racing thoughts  ?Yes shortness of breath No sweating  ?No tremors/shakes   ? ?GAD-7 Results ? ?  03/01/2022  ?  2:28 PM 02/15/2022  ? 11:22 AM 09/15/2021  ?  4:05 PM  ?GAD-7 Generalized Anxiety Disorder Screening Tool  ?1. Feeling Nervous, Anxious, or on Edge 2 0 1  ?2. Not Being Able to Stop or Control Worrying 0 0 0  ?3. Worrying Too Much About Different Things 0 0 0  ?4. Trouble Relaxing 2 0 0  ?5. Being So Restless it's Hard To Sit Still 0 0 0  ?6. Becoming Easily Annoyed or Irritable 0 0 0  ?7. Feeling Afraid As If Something Awful Might Happen 0 0 0  ?Total GAD-7 Score 4 0 1  ?Difficulty At Work, Home, or Getting  Along With Others? Not difficult at all  Not difficult at all  ? ? ?PHQ-9 Scores ? ?  03/01/2022  ?  2:28 PM 02/15/2022  ? 11:22 AM 12/16/2021  ?  2:42 PM  ?PHQ9 SCORE ONLY  ?PHQ-9 Total Score 4 0 4  ? ? ? ? ?Past Medical  History:  ?Diagnosis Date  ? Acute on chronic diastolic CHF (congestive heart failure) ([place]) 03/05/2015  ? Grade 2. EF 60-65%  ? Anemia   ? hx of  ? Anxiety   ? Arthritis   ? CAD (coronary artery disease)   ? s/p Stenting in 2005;  [place] 7/09: Vigorous LV function, 2-3+ MR, RI 40%, RI stent patent, proximal-mid RCA 40-50%;  Echo 7/09:  EF 55-65%, trivial MR;  Myoview 11/18/12: EF 66%, normal LV wall motion, mild to moderate anterior ischemia - reviewed by [facility] and felt to rep breast atten (low risk);  Echo 6/14: mild LVH, EF 55-65%, Gr 2 DD, PASP 44, trivial eff   ? Chronic kidney disease   ? CREATININE IS UP--GOING TO KIDNEY MD   ? Chronic neck pain   ? COPD (chronic obstructive pulmonary disease) ([place])   ? Degenerative joint disease   ? left shoulder  ? Diabetic neuropathy ([place])   ? Diverticulosis   ? Esophageal stricture   ? GERD (gastroesophageal reflux disease)   ? Hyperlipidemia   ? Hypertension   ? IDDM (insulin dependent diabetes mellitus)   ? Pericardial effusion 03/06/2015  ? Very small per echo ; pleural nodules seen on CT chest.  ? Peripheral  vascular disease ([place])   ? Stroke [facility])   ? "mini- years ago"  ? Vitamin D deficiency   ? ? ?Past Surgical History:  ?Procedure Laterality Date  ? A/V FISTULAGRAM N/A 08/10/2017  ? Procedure: A/V Fistulagram - Right Arm;  Surgeon: Elam Dutch, MD;  Location: [place] [facility];  Service: Cardiovascular;  Laterality: N/A;  ? A/V FISTULAGRAM N/A 03/03/2020  ? Procedure: A/V FISTULAGRAM - Right Arm;  Surgeon: Marty Heck, MD;  Location: [place] [facility];  Service: Cardiovascular;  Laterality: N/A;  ? ABDOMINAL HYSTERECTOMY    ? AV FISTULA PLACEMENT Right 07/20/2015  ? Procedure: RADIAL CEPHALIC ARTERIOVENOUS FISTULA CREATION RIGHT ARM;  Surgeon: Angelia Mould, MD;  Location: [place];  Service: Vascular;  Laterality: Right;  ? AV FISTULA PLACEMENT Right 11/26/2015  ? Procedure:  Creation of RIGHT BRACHIO-CEPHALIC arteriovenous fistula;  Surgeon: Angelia Mould, MD;  Location: [place];  Service: Vascular;  Laterality: Right;  ? BACK SURGERY    ? 2007 & 2013  ? BLADDER SURGERY    ? Tack  ? CARDIAC CATHETERIZATION  2003, 2005, 2009  ? 1 stent  ? cardiac stents    ? CATARACT EXTRACTION W/PHACO Right 05/04/2014  ? Procedure: CATARACT EXTRACTION PHACO AND INTRAOCULAR LENS PLACEMENT (IOC);  Surgeon: Williams Che, MD;  Location: [facility];  Service: Ophthalmology;  Laterality: Right;  CDE 1.47  ? CATARACT EXTRACTION W/PHACO Left 07/20/2014  ? Procedure: CATARACT EXTRACTION WITH PHACO AND INTRAOCULAR LENS PLACEMENT LEFT EYE CDE=1.79;  Surgeon: Williams Che, MD;  Location: [facility];  Service: Ophthalmology;  Laterality: Left;  ? CERVICAL SPINE SURGERY  2012  ? 8 screws  ? CYSTOSCOPY WITH INJECTION N/A 06/03/2013  ? Procedure: MACROPLASTIQUE  INJECTION ;  Surgeon: Malka So, MD;  Location: [facility];  Service: Urology;  Laterality: N/A;  ? FISTULOGRAM Right 10/20/2016  ? Procedure: FISTULOGRAM WITH POSSIBLE INTERVENTION;  Surgeon: [name], MD;  Location: [facility];  Service: Vascular;  Laterality: Right;  ? INSERTION OF DIALYSIS CATHETER N/A 11/26/2015  ? Procedure: INSERTION OF DIALYSIS CATHETER;  Surgeon: Angelia Mould, MD;  Location: [place];  Service: Vascular;  Laterality: N/A;  ? LAMINECTOMY  12/29/2011  ? LUMBAR LAMINECTOMY/DECOMPRESSION MICRODISCECTOMY  12/29/2011  ? Procedure: LUMBAR LAMINECTOMY/DECOMPRESSION MICRODISCECTOMY;  Surgeon: Floyce Stakes, MD;  Location: [place] [facility];  Service: Neurosurgery;  Laterality: N/A;  Lumbar Two through Lumbar Five Laminectomies/Cellsaver  ? PARTIAL HYSTERECTOMY    ? PERIPHERAL VASCULAR BALLOON ANGIOPLASTY  03/03/2020  ? Procedure: PERIPHERAL VASCULAR BALLOON ANGIOPLASTY;  Surgeon: Marty Heck, MD;  Location: [place] [facility];  Service: Cardiovascular;;  Rt arm fistula  ? PERIPHERAL VASCULAR BALLOON ANGIOPLASTY Right 04/15/2021  ? Procedure: PERIPHERAL VASCULAR BALLOON ANGIOPLASTY;  Surgeon: Angelia Mould, MD;  Location: [place] [facility];  Service: Cardiovascular;  Laterality: Right;  arm fistula  ? PERIPHERAL VASCULAR CATHETERIZATION N/A 11/22/2015  ? Procedure: Fistulagram;  Surgeon: Angelia Mould, MD;  Location: [place] [facility];  Service: Cardiovascular;  Laterality: N/A;  ? PERIPHERAL VASCULAR INTERVENTION  08/10/2017  ? Procedure: PERIPHERAL VASCULAR INTERVENTION;  Surgeon: Elam Dutch, MD;  Location: [place] [facility];  Service: Cardiovascular;;  ? SHOULDER SURGERY Right   ? Rotator cuff  ? ? ?Family History  ?Problem Relation Age of Onset  ? Heart disease Other   ? Hypertension Father   ? Cancer Father   ? Hypertension Mother   ? Hypertension  Daughter   ? Pancreatitis Brother   ? Hypertension Daughter   ? Diabetes Daughter   ? ? ?Social History  ? ?Socioeconomic History  ? Marital status: Widowed  ?  Spouse name: Not on file  ? Number of children: 2  ? Years of education: GED  ? Highest education level: GED or equivalent  ?Occupational History  ? Occupation: Disabled  ?Tobacco Use  ? Smoking status: Former  ?  Packs/day: 1.00  ?  Years: 30.00  ?  Pack years: 30.00  ?  Types: Cigarettes  ?  Quit date: 12/04/2001  ?  Years since quitting: 20.2  ? Smokeless tobacco: Never  ?Vaping Use  ? Vaping Use: Never used  ?Substance and Sexual Activity  ? Alcohol use: No  ?  Alcohol/week: 6.0 standard drinks  ?  Types: 6 Cans of beer per week  ? Drug use: No  ? Sexual activity: Not Currently  ?  Birth control/protection: Surgical  ?Other Topics Concern  ? Not on file  ?Social History Narrative  ? Lives in [place] with mother  ? Daughters live nearby  ? ?Social Determinants of Health  ? ?Financial Resource Strain: Low Risk   ? Difficulty of Paying Living Expenses: Not hard at all  ?Food Insecurity: No Food Insecurity  ? Worried About Charity fundraiser in the Last Year: Never true  ? Ran Out of Food in the Last Year: Never true  ?Transportation Needs: No Transportation Needs  ? Lack of Transportation  (Medical): No  ? Lack of Transportation (Non-Medical): No  ?Physical Activity: Inactive  ? Days of Exercise per Week: 0 days  ? Minutes of Exercise per Session: 0 min  ?Stress: No Stress Concern [place]

## 2022-03-01 NOTE — Patient Instructions (Addendum)
Shortness of Breath, Adult ?Shortness of breath is when a person has trouble breathing or when a person feels like she or he is having trouble breathing in enough air. Shortness of breath could be a sign of a medical problem. ?Follow these instructions at home: ?Pollutants ?Do not use any products that contain nicotine or tobacco. These products include cigarettes, chewing tobacco, and vaping devices, such as e-cigarettes. This also includes cigars and pipes. If you need help quitting, ask your health care provider. ?Avoid things that can irritate your airways, including: ?Smoke. This includes campfire smoke, forest fire smoke, and secondhand smoke from tobacco products. Do not smoke or allow others to smoke in your home. ?Mold. ?Dust. ?Air pollution. ?Chemical fumes. ?Things that can give you an allergic reaction (allergens) if you have allergies. Common allergens include pollen from grasses or trees and animal dander. ?Keep your living space clean and free of mold and dust. ?General instructions ?Pay attention to any changes in your symptoms. ?Take over-the-counter and prescription medicines only as told by your health care provider. This includes oxygen therapy and inhaled medicines. ?Rest as needed. ?Return to your normal activities as told by your health care provider. Ask your health care provider what activities are safe for you. ?Keep all follow-up visits. This is important. ?Contact a health care provider if: ?Your condition does not improve as soon as expected. ?You have a hard time doing your normal activities, even after you rest. ?You have new symptoms. ?You cannot walk up stairs or exercise the way that you normally do. ?Get help right away if: ?Your shortness of breath gets worse. ?You have shortness of breath when you are resting. ?You feel light-headed or you faint. ?You have a cough that is not controlled with medicines. ?You cough up blood. ?You have pain with breathing. ?You have pain in your  chest, arms, shoulders, or abdomen. ?You have a fever. ?These symptoms may be an emergency. Get help right away. Call 911. ?Do not wait to see if the symptoms will go away. ?Do not drive yourself to the hospital. ?Summary ?Shortness of breath is when a person has trouble breathing enough air. It can be a sign of a medical problem. ?Avoid things that irritate your lungs, such as smoking, pollution, mold, and dust. ?Pay attention to changes in your symptoms and contact your health care provider if you have a hard time completing daily activities because of shortness of breath. ?This information is not intended to replace advice given to you by your health care provider. Make sure you discuss any questions you have with your health care provider. ?Document Revised: 07/09/2021 Document Reviewed: 07/09/2021 ?Elsevier Patient Education ? [place]. ?Generalized Anxiety Disorder, Adult ?Generalized anxiety disorder (GAD) is a mental health condition. Unlike normal worries, anxiety related to GAD is not triggered by a specific event. These worries do not fade or get better with time. GAD interferes with relationships, work, and school. ?GAD symptoms can vary from mild to severe. People with severe GAD can have intense waves of anxiety with physical symptoms that are similar to panic attacks. ?What are the causes? ?The exact cause of GAD is not known, but the following are believed to have an impact: ?Differences in natural brain chemicals. ?Genes passed down from parents to children. ?Differences in the way threats are perceived. ?Development and stress during childhood. ?Personality. ?What increases the risk? ?The following factors may make you more likely to develop this condition: ?Being female. ?Having a family history  of anxiety disorders. ?Being very shy. ?Experiencing very stressful life events, such as the death of a loved one. ?Having a very stressful family environment. ?What are the signs or  symptoms? ?People with GAD often worry excessively about many things in their lives, such as their health and family. Symptoms may also include: ?Mental and emotional symptoms: ?Worrying excessively about natural disasters. ?Fear of being late. ?Difficulty concentrating. ?Fears that others are judging your performance. ?Physical symptoms: ?Fatigue. ?Headaches, muscle tension, muscle twitches, trembling, or feeling shaky. ?Feeling like your heart is pounding or beating very fast. ?Feeling out of breath or like you cannot take a deep breath. ?Having trouble falling asleep or staying asleep, or experiencing restlessness. ?Sweating. ?Nausea, diarrhea, or irritable bowel syndrome (IBS). ?Behavioral symptoms: ?Experiencing erratic moods or irritability. ?Avoidance of new situations. ?Avoidance of people. ?Extreme difficulty making decisions. ?How is this diagnosed? ?This condition is diagnosed based on your symptoms and medical history. You will also have a physical exam. Your health care provider may perform tests to rule out other possible causes of your symptoms. ?To be diagnosed with GAD, a person must have anxiety that: ?Is out of his or her control. ?Affects several different aspects of his or her life, such as work and relationships. ?Causes distress that makes him or her unable to take part in normal activities. ?Includes at least three symptoms of GAD, such as restlessness, fatigue, trouble concentrating, irritability, muscle tension, or sleep problems. ?Before your health care provider can confirm a diagnosis of GAD, these symptoms must be present more days than they are not, and they must last for 6 months or longer. ?How is this treated? ?This condition may be treated with: ?Medicine. Antidepressant medicine is usually prescribed for long-term daily control. Anti-anxiety medicines may be added in severe cases, especially when panic attacks occur. ?Talk therapy (psychotherapy). Certain types of talk therapy can  be helpful in treating GAD by providing support, education, and guidance. Options include: ?Cognitive behavioral therapy (CBT). People learn coping skills and self-calming techniques to ease their physical symptoms. They learn to identify unrealistic thoughts and behaviors and to replace them with more appropriate thoughts and behaviors. ?Acceptance and commitment therapy (ACT). This treatment teaches people how to be mindful as a way to cope with unwanted thoughts and feelings. ?Biofeedback. This process trains you to manage your body's response (physiological response) through breathing techniques and relaxation methods. You will work with a therapist while machines are used to monitor your physical symptoms. ?Stress management techniques. These include yoga, meditation, and exercise. ?A mental health specialist can help determine which treatment is best for you. Some people see improvement with one type of therapy. However, other people require a combination of therapies. ?Follow these instructions at home: ?Lifestyle ?Maintain a consistent routine and schedule. ?Anticipate stressful situations. Create a plan and allow extra time to work with your plan. ?Practice stress management or self-calming techniques that you have learned from your therapist or your health care provider. ?Exercise regularly and spend time outdoors. ?Eat a healthy diet that includes plenty of vegetables, fruits, whole grains, low-fat dairy products, and lean protein. ?Do not eat a lot of foods that are high in fat, added sugar, or salt (sodium). ?Drink plenty of water. ?Avoid alcohol. Alcohol can increase anxiety. ?Avoid caffeine and certain over-the-counter cold medicines. These may make you feel worse. Ask your pharmacist which medicines to avoid. ?General instructions ?Take over-the-counter and prescription medicines only as told by your health care provider. ?Understand that you are  likely to have setbacks. Accept this and be kind to  yourself as you persist to take better care of yourself. ?Anticipate stressful situations. Create a plan and allow extra time to work with your plan. ?Recognize and accept your accomplishments, even if you judge them

## 2022-03-02 DIAGNOSIS — N2581 Secondary hyperparathyroidism of renal origin: Secondary | ICD-10-CM | POA: Diagnosis not present

## 2022-03-02 DIAGNOSIS — N186 End stage renal disease: Secondary | ICD-10-CM | POA: Diagnosis not present

## 2022-03-02 DIAGNOSIS — D631 Anemia in chronic kidney disease: Secondary | ICD-10-CM | POA: Diagnosis not present

## 2022-03-02 DIAGNOSIS — D509 Iron deficiency anemia, unspecified: Secondary | ICD-10-CM | POA: Diagnosis not present

## 2022-03-02 DIAGNOSIS — Z992 Dependence on renal dialysis: Secondary | ICD-10-CM | POA: Diagnosis not present

## 2022-03-03 DIAGNOSIS — Z992 Dependence on renal dialysis: Secondary | ICD-10-CM | POA: Diagnosis not present

## 2022-03-03 DIAGNOSIS — N186 End stage renal disease: Secondary | ICD-10-CM | POA: Diagnosis not present

## 2022-03-04 ENCOUNTER — Emergency Department (HOSPITAL_COMMUNITY): Payer: Medicare Other

## 2022-03-04 ENCOUNTER — Other Ambulatory Visit: Payer: Self-pay

## 2022-03-04 ENCOUNTER — Encounter (HOSPITAL_COMMUNITY): Payer: Self-pay | Admitting: *Deleted

## 2022-03-04 ENCOUNTER — Inpatient Hospital Stay (HOSPITAL_COMMUNITY)
Admission: EM | Admit: 2022-03-04 | Discharge: 2022-03-15 | DRG: 252 | Disposition: A | Discharge status: Home Health | Payer: Medicare Other | Attending: Internal Medicine | Admitting: Internal Medicine

## 2022-03-04 DIAGNOSIS — I152 Hypertension secondary to endocrine disorders: Secondary | ICD-10-CM | POA: Diagnosis present

## 2022-03-04 DIAGNOSIS — G8929 Other chronic pain: Secondary | ICD-10-CM | POA: Diagnosis present

## 2022-03-04 DIAGNOSIS — Z992 Dependence on renal dialysis: Secondary | ICD-10-CM | POA: Diagnosis not present

## 2022-03-04 DIAGNOSIS — I953 Hypotension of hemodialysis: Secondary | ICD-10-CM | POA: Diagnosis not present

## 2022-03-04 DIAGNOSIS — Z7952 Long term (current) use of systemic steroids: Secondary | ICD-10-CM

## 2022-03-04 DIAGNOSIS — I4891 Unspecified atrial fibrillation: Secondary | ICD-10-CM | POA: Diagnosis present

## 2022-03-04 DIAGNOSIS — E785 Hyperlipidemia, unspecified: Secondary | ICD-10-CM | POA: Diagnosis not present

## 2022-03-04 DIAGNOSIS — N186 End stage renal disease: Secondary | ICD-10-CM | POA: Diagnosis present

## 2022-03-04 DIAGNOSIS — I480 Paroxysmal atrial fibrillation: Secondary | ICD-10-CM | POA: Diagnosis present

## 2022-03-04 DIAGNOSIS — Z7951 Long term (current) use of inhaled steroids: Secondary | ICD-10-CM

## 2022-03-04 DIAGNOSIS — J449 Chronic obstructive pulmonary disease, unspecified: Secondary | ICD-10-CM | POA: Diagnosis present

## 2022-03-04 DIAGNOSIS — N2581 Secondary hyperparathyroidism of renal origin: Secondary | ICD-10-CM | POA: Diagnosis present

## 2022-03-04 DIAGNOSIS — E871 Hypo-osmolality and hyponatremia: Secondary | ICD-10-CM | POA: Diagnosis not present

## 2022-03-04 DIAGNOSIS — K219 Gastro-esophageal reflux disease without esophagitis: Secondary | ICD-10-CM | POA: Diagnosis not present

## 2022-03-04 DIAGNOSIS — D649 Anemia, unspecified: Secondary | ICD-10-CM | POA: Diagnosis present

## 2022-03-04 DIAGNOSIS — T82318A Breakdown (mechanical) of other vascular grafts, initial encounter: Secondary | ICD-10-CM | POA: Diagnosis not present

## 2022-03-04 DIAGNOSIS — Z833 Family history of diabetes mellitus: Secondary | ICD-10-CM

## 2022-03-04 DIAGNOSIS — R778 Other specified abnormalities of plasma proteins: Secondary | ICD-10-CM

## 2022-03-04 DIAGNOSIS — D72829 Elevated white blood cell count, unspecified: Secondary | ICD-10-CM | POA: Diagnosis present

## 2022-03-04 DIAGNOSIS — E1159 Type 2 diabetes mellitus with other circulatory complications: Secondary | ICD-10-CM

## 2022-03-04 DIAGNOSIS — E1151 Type 2 diabetes mellitus with diabetic peripheral angiopathy without gangrene: Secondary | ICD-10-CM | POA: Diagnosis present

## 2022-03-04 DIAGNOSIS — Z20822 Contact with and (suspected) exposure to covid-19: Secondary | ICD-10-CM | POA: Diagnosis present

## 2022-03-04 DIAGNOSIS — I70201 Unspecified atherosclerosis of native arteries of extremities, right leg: Secondary | ICD-10-CM | POA: Diagnosis present

## 2022-03-04 DIAGNOSIS — Z90711 Acquired absence of uterus with remaining cervical stump: Secondary | ICD-10-CM

## 2022-03-04 DIAGNOSIS — Z87891 Personal history of nicotine dependence: Secondary | ICD-10-CM

## 2022-03-04 DIAGNOSIS — Z888 Allergy status to other drugs, medicaments and biological substances status: Secondary | ICD-10-CM

## 2022-03-04 DIAGNOSIS — I25110 Atherosclerotic heart disease of native coronary artery with unstable angina pectoris: Secondary | ICD-10-CM | POA: Diagnosis present

## 2022-03-04 DIAGNOSIS — Z79899 Other long term (current) drug therapy: Secondary | ICD-10-CM

## 2022-03-04 DIAGNOSIS — I272 Pulmonary hypertension, unspecified: Secondary | ICD-10-CM | POA: Diagnosis present

## 2022-03-04 DIAGNOSIS — I358 Other nonrheumatic aortic valve disorders: Secondary | ICD-10-CM | POA: Diagnosis present

## 2022-03-04 DIAGNOSIS — E1122 Type 2 diabetes mellitus with diabetic chronic kidney disease: Secondary | ICD-10-CM | POA: Diagnosis present

## 2022-03-04 DIAGNOSIS — I5033 Acute on chronic diastolic (congestive) heart failure: Secondary | ICD-10-CM | POA: Diagnosis present

## 2022-03-04 DIAGNOSIS — Z7982 Long term (current) use of aspirin: Secondary | ICD-10-CM

## 2022-03-04 DIAGNOSIS — I2510 Atherosclerotic heart disease of native coronary artery without angina pectoris: Secondary | ICD-10-CM | POA: Diagnosis present

## 2022-03-04 DIAGNOSIS — R319 Hematuria, unspecified: Secondary | ICD-10-CM | POA: Diagnosis not present

## 2022-03-04 DIAGNOSIS — Z955 Presence of coronary angioplasty implant and graft: Secondary | ICD-10-CM

## 2022-03-04 DIAGNOSIS — E876 Hypokalemia: Secondary | ICD-10-CM | POA: Diagnosis present

## 2022-03-04 DIAGNOSIS — I132 Hypertensive heart and chronic kidney disease with heart failure and with stage 5 chronic kidney disease, or end stage renal disease: Secondary | ICD-10-CM | POA: Diagnosis present

## 2022-03-04 DIAGNOSIS — Z95820 Peripheral vascular angioplasty status with implants and grafts: Secondary | ICD-10-CM | POA: Diagnosis not present

## 2022-03-04 DIAGNOSIS — Z881 Allergy status to other antibiotic agents status: Secondary | ICD-10-CM

## 2022-03-04 DIAGNOSIS — T82838A Hemorrhage of vascular prosthetic devices, implants and grafts, initial encounter: Secondary | ICD-10-CM | POA: Diagnosis not present

## 2022-03-04 DIAGNOSIS — I5032 Chronic diastolic (congestive) heart failure: Secondary | ICD-10-CM | POA: Diagnosis not present

## 2022-03-04 DIAGNOSIS — I200 Unstable angina: Secondary | ICD-10-CM | POA: Diagnosis not present

## 2022-03-04 DIAGNOSIS — E559 Vitamin D deficiency, unspecified: Secondary | ICD-10-CM | POA: Diagnosis present

## 2022-03-04 DIAGNOSIS — E1140 Type 2 diabetes mellitus with diabetic neuropathy, unspecified: Secondary | ICD-10-CM | POA: Diagnosis present

## 2022-03-04 DIAGNOSIS — I517 Cardiomegaly: Secondary | ICD-10-CM | POA: Diagnosis not present

## 2022-03-04 DIAGNOSIS — F419 Anxiety disorder, unspecified: Secondary | ICD-10-CM | POA: Diagnosis present

## 2022-03-04 DIAGNOSIS — Z8673 Personal history of transient ischemic attack (TIA), and cerebral infarction without residual deficits: Secondary | ICD-10-CM

## 2022-03-04 DIAGNOSIS — E1169 Type 2 diabetes mellitus with other specified complication: Secondary | ICD-10-CM | POA: Diagnosis present

## 2022-03-04 DIAGNOSIS — Z8249 Family history of ischemic heart disease and other diseases of the circulatory system: Secondary | ICD-10-CM

## 2022-03-04 DIAGNOSIS — M898X9 Other specified disorders of bone, unspecified site: Secondary | ICD-10-CM | POA: Diagnosis present

## 2022-03-04 DIAGNOSIS — Z885 Allergy status to narcotic agent status: Secondary | ICD-10-CM

## 2022-03-04 DIAGNOSIS — Z6833 Body mass index (BMI) 33.0-33.9, adult: Secondary | ICD-10-CM

## 2022-03-04 DIAGNOSIS — D631 Anemia in chronic kidney disease: Secondary | ICD-10-CM | POA: Diagnosis not present

## 2022-03-04 DIAGNOSIS — E669 Obesity, unspecified: Secondary | ICD-10-CM | POA: Diagnosis present

## 2022-03-04 DIAGNOSIS — D509 Iron deficiency anemia, unspecified: Secondary | ICD-10-CM | POA: Diagnosis not present

## 2022-03-04 DIAGNOSIS — E1121 Type 2 diabetes mellitus with diabetic nephropathy: Secondary | ICD-10-CM | POA: Diagnosis present

## 2022-03-04 DIAGNOSIS — Z23 Encounter for immunization: Secondary | ICD-10-CM | POA: Diagnosis not present

## 2022-03-04 DIAGNOSIS — I120 Hypertensive chronic kidney disease with stage 5 chronic kidney disease or end stage renal disease: Secondary | ICD-10-CM | POA: Diagnosis not present

## 2022-03-04 DIAGNOSIS — Z794 Long term (current) use of insulin: Secondary | ICD-10-CM

## 2022-03-04 DIAGNOSIS — N250 Renal osteodystrophy: Secondary | ICD-10-CM | POA: Diagnosis not present

## 2022-03-04 DIAGNOSIS — R0602 Shortness of breath: Secondary | ICD-10-CM | POA: Diagnosis not present

## 2022-03-04 DIAGNOSIS — Z809 Family history of malignant neoplasm, unspecified: Secondary | ICD-10-CM

## 2022-03-04 DIAGNOSIS — R5381 Other malaise: Secondary | ICD-10-CM | POA: Diagnosis not present

## 2022-03-04 DIAGNOSIS — I509 Heart failure, unspecified: Secondary | ICD-10-CM | POA: Diagnosis not present

## 2022-03-04 DIAGNOSIS — K921 Melena: Secondary | ICD-10-CM | POA: Diagnosis not present

## 2022-03-04 DIAGNOSIS — R079 Chest pain, unspecified: Secondary | ICD-10-CM | POA: Diagnosis present

## 2022-03-04 DIAGNOSIS — I214 Non-ST elevation (NSTEMI) myocardial infarction: Secondary | ICD-10-CM

## 2022-03-04 DIAGNOSIS — Z880 Allergy status to penicillin: Secondary | ICD-10-CM

## 2022-03-04 LAB — CBC
HCT: 38.1 % (ref 36.0–46.0)
Hemoglobin: 12.3 g/dL (ref 12.0–15.0)
MCH: 27.9 pg (ref 26.0–34.0)
MCHC: 32.3 g/dL (ref 30.0–36.0)
MCV: 86.4 fL (ref 80.0–100.0)
Platelets: 266 10*3/uL (ref 150–400)
RBC: 4.41 MIL/uL (ref 3.87–5.11)
RDW: 17.4 % — ABNORMAL HIGH (ref 11.5–15.5)
WBC: 15 10*3/uL — ABNORMAL HIGH (ref 4.0–10.5)
nRBC: 0 % (ref 0.0–0.2)

## 2022-03-04 LAB — BASIC METABOLIC PANEL
Anion gap: 14 (ref 5–15)
BUN: 34 mg/dL — ABNORMAL HIGH (ref 8–23)
CO2: 27 mmol/L (ref 22–32)
Calcium: 8.7 mg/dL — ABNORMAL LOW (ref 8.9–10.3)
Chloride: 92 mmol/L — ABNORMAL LOW (ref 98–111)
Creatinine, Ser: 6.09 mg/dL — ABNORMAL HIGH (ref 0.44–1.00)
GFR, Estimated: 7 mL/min — ABNORMAL LOW (ref >60)
Glucose, Bld: 274 mg/dL — ABNORMAL HIGH (ref 70–99)
Potassium: 3.3 mmol/L — ABNORMAL LOW (ref 3.5–5.1)
Sodium: 133 mmol/L — ABNORMAL LOW (ref 135–145)

## 2022-03-04 LAB — TROPONIN I (HIGH SENSITIVITY)
Troponin I (High Sensitivity): 99 ng/L — ABNORMAL HIGH (ref <18)
Troponin I (High Sensitivity): 105 ng/L (ref <18)

## 2022-03-04 LAB — TSH: TSH: 4.743 u[IU]/mL — ABNORMAL HIGH (ref 0.350–4.500)

## 2022-03-04 MED ORDER — DILTIAZEM HCL-DEXTROSE 125-5 MG/125ML-% IV SOLN (PREMIX)
5.0000 mg/h | INTRAVENOUS | Status: DC
  Administered 2022-03-04: 5 mg/h via INTRAVENOUS
  Administered 2022-03-05: 10 mg/h via INTRAVENOUS
  Filled 2022-03-04 (×2): qty 125

## 2022-03-04 MED ORDER — METOPROLOL TARTRATE 5 MG/5ML IV SOLN
5.0000 mg | Freq: Once | INTRAVENOUS | Status: AC
  Administered 2022-03-04: 5 mg via INTRAVENOUS
  Filled 2022-03-04: qty 5

## 2022-03-04 NOTE — ED Triage Notes (Signed)
Pt with SOB and mid CP this evening after getting home from her dialysis that was done today.  Pt had same on Tuesday night.  Pt states she has seen cardiologist on wednesday and was her understanding was to receive a monitor which pt has not received.  ?

## 2022-03-04 NOTE — Progress Notes (Signed)
ANTICOAGULATION CONSULT NOTE - Initial Consult ? ?Pharmacy Consult for Heparin ?Indication: atrial fibrillation ? ?Allergies  ?Allergen Reactions  ? Azithromycin   ?  Prolonged QT  ? Ace Inhibitors Cough  ? Clopidogrel Bisulfate Other (See Comments)  ?  Sick, Headache, "Felt terrible"  ? Codeine Other (See Comments)  ?  hallucinations  ? Lisinopril Cough  ? Penicillins Other (See Comments)  ?  Unknown reaction ?Did it involve swelling of the face/tongue/throat, SOB, or low BP? Unknown ?Did it involve sudden or severe rash/hives, skin peeling, or any reaction on the inside of your mouth or nose? Unknown ?Did you need to seek medical attention at a hospital or doctor's office? Unknown ?When did it last happen?      childhood allergy ?If all above answers are "NO", may proceed with cephalosporin use. ? ?  ? ? ?Patient Measurements: ?Height: 5\' 3"  (160 cm) ?Weight: 85.3 kg (188 lb) ?IBW/kg (Calculated) : 52.4 ?Heparin Dosing Weight: 71 kg ? ?Vital Signs: ?Temp: 97.6 ?F (36.4 ?C) (04/01 1931) ?Temp Source: Oral (04/01 1931) ?BP: 167/66 (04/01 2330) ?Pulse Rate: 109 (04/01 2330) ? ?Labs: ?Recent Labs  ?  03/04/22 ?2041 03/04/22 ?2246  ?HGB 12.3  --   ?HCT 38.1  --   ?PLT 266  --   ?CREATININE 6.09*  --   ?TROPONINIHS 99* 105*  ? ? ?Estimated Creatinine Clearance: 9 mL/min (A) (by C-G formula based on SCr of 6.09 mg/dL (H)). ? ? ?Medical History: ?Past Medical History:  ?Diagnosis Date  ? Acute on chronic diastolic CHF (congestive heart failure) ([place]) 03/05/2015  ? Grade 2. EF 60-65%  ? Anemia   ? hx of  ? Anxiety   ? Arthritis   ? CAD (coronary artery disease)   ? s/p Stenting in 2005;  [place] 7/09: Vigorous LV function, 2-3+ MR, RI 40%, RI stent patent, proximal-mid RCA 40-50%;  [place] 7/09:  EF 55-65%, trivial MR;  [place] 11/18/12: EF 66%, normal LV wall motion, mild to moderate anterior ischemia - reviewed by [facility] and felt to rep breast atten (low risk);  [place] 6/14: mild LVH, EF 55-65%, Gr 2 DD, PASP 44, trivial eff   ?  Chronic kidney disease   ? CREATININE IS UP--GOING TO KIDNEY MD   ? Chronic neck pain   ? COPD (chronic obstructive pulmonary disease) ([place])   ? Degenerative joint disease   ? left shoulder  ? Diabetic neuropathy ([place])   ? Diverticulosis   ? Esophageal stricture   ? GERD (gastroesophageal reflux disease)   ? Hyperlipidemia   ? Hypertension   ? IDDM (insulin dependent diabetes mellitus)   ? Pericardial effusion 03/06/2015  ? Very small per echo ; pleural nodules seen on CT chest.  ? Peripheral vascular disease ([place])   ? Stroke [facility])   ? "mini- years ago"  ? Vitamin D deficiency   ? ? ?Medications:  ?Awaiting home med reconciliation ? ?Assessment: ?71 y.o. F presents with CP.  Found to be in afib with RVR. CHADS2VASC score is 6. CBC stable. No AC PTA. Pt is ESRD pt. ? ?Goal of Therapy:  ?Heparin level 0.3-0.7 units/ml ?Monitor platelets by anticoagulation protocol: Yes ?  ?Plan:  ?Heparin IV bolus 4000 units ?Heparin gtt at 1000 units/hr ?Will f/u heparin level in 8 hours ?Daily heparin level and CBC ? ?Sherlon Handing, PharmD, BCPS ?Please see amion for complete clinical pharmacist phone list ?03/04/2022,11:59 PM ? ? ?

## 2022-03-04 NOTE — ED Provider Notes (Signed)
?[place] ?Provider Note ? ? ?CSN: [id number] ?Arrival date & time: 03/04/22  1923 ? ?  ? ?History ? ?Chief Complaint  ?Patient presents with  ? Chest Pain  ? ? ?[name] is a 71 y.o. female. ? ?This is a 71 y.o. fem  with significant medical history as below, including ESRD on HD TTHS, CHF, RUE fistula, prior PCI who presents to the ED with complaint of chest pressure/tightness.  ? ?Onset approximately 3 months ago.  She was seen in emergency department Thursday after dialysis with similar complaint.  Discharged in stable condition.  She follow-up PCP who patient reports felt the patient had anxiety.  Pain resumed today after dialysis.  She received full session today.  Described as a pressure, tightness sensation to her mid chest.  Nonradiating.  Not associate with nausea or vomiting, no dyspnea or lightheadedness, no radiation to her back or down her arms, neck.  ? ?She is compliant with her medications.  No recent stimulant use ? ? ? ?Past Medical History: ?03/05/2015: Acute on chronic diastolic CHF (congestive heart failure)  ?(HCC) ?    Comment:  Grade 2. EF 60-65% ?No date: Anemia ?    Comment:  hx of ?No date: Anxiety ?No date: Arthritis ?No date: CAD (coronary artery disease) ?    Comment:  s/p Stenting in 2005;  [place] 7/09: Vigorous LV function,  ?             2-3+ MR, RI 40%, RI stent patent, proximal-mid RCA  ?             40-50%;  Echo 7/09:  EF 55-65%, trivial MR;  Myoview  ?             11/18/12: EF 66%, normal LV wall motion, mild to moderate ?             anterior ischemia - reviewed by [facility] and felt to rep breast ?             atten (low risk);  Echo 6/14: mild LVH, EF 55-65%, Gr 2  ?             DD, PASP 44, trivial eff  ?No date: Chronic kidney disease ?    Comment:  CREATININE IS UP--GOING TO KIDNEY MD  ?No date: Chronic neck pain ?No date: COPD (chronic obstructive pulmonary disease) ([place]) ?No date: Degenerative joint disease ?    Comment:  left shoulder ?No date: Diabetic  neuropathy ([place]) ?No date: Diverticulosis ?No date: Esophageal stricture ?No date: GERD (gastroesophageal reflux disease) ?No date: Hyperlipidemia ?No date: Hypertension ?No date: IDDM (insulin dependent diabetes mellitus) ?03/06/2015: Pericardial effusion ?    Comment:  Very small per echo ; pleural nodules seen on CT chest. ?No date: Peripheral vascular disease ([place]) ?No date: Stroke [facility]) ?    Comment:  "mini- years ago" ?No date: Vitamin D deficiency ? ?Past Surgical History: ?08/10/2017: A/V FISTULAGRAM; N/A ?    Comment:  Procedure: A/V Fistulagram - Right Arm;  Surgeon:  ?             Elam Dutch, MD;  Location: [place] CV LAB;   ?             Service: Cardiovascular;  Laterality: N/A; ?03/03/2020: A/V FISTULAGRAM; N/A ?    Comment:  Procedure: A/V FISTULAGRAM - Right Arm;  Surgeon: Carlis Abbott, ?             Harrell Gave  J, MD;  Location: [place] CV LAB;   ?             Service: Cardiovascular;  Laterality: N/A; ?No date: ABDOMINAL HYSTERECTOMY ?07/20/2015: AV FISTULA PLACEMENT; Right ?    Comment:  Procedure: RADIAL CEPHALIC ARTERIOVENOUS FISTULA  ?             CREATION RIGHT ARM;  Surgeon: Angelia Mould, MD;  ?             Location: [place];  Service: Vascular;  Laterality: Right; ?11/26/2015: AV FISTULA PLACEMENT; Right ?    Comment:  Procedure:  Creation of RIGHT BRACHIO-CEPHALIC  ?             arteriovenous fistula;  Surgeon: Angelia Mould,  ?             MD;  Location: [place];  Service: Vascular;  Laterality:  ?             Right; ?No date: BACK SURGERY ?    Comment:  2007 & 2013 ?No date: BLADDER SURGERY ?    Comment:  Tack ?2003, 2005, 2009: CARDIAC CATHETERIZATION ?    Comment:  1 stent ?No date: cardiac stents ?05/04/2014: CATARACT EXTRACTION W/PHACO; Right ?    Comment:  Procedure: CATARACT EXTRACTION PHACO AND INTRAOCULAR  ?             LENS PLACEMENT ([place]);  Surgeon: Williams Che, MD;   ?             [facility]: [facility];  Service: Ophthalmology;  Laterality:  ?             Right;   CDE 1.47 ?07/20/2014: CATARACT EXTRACTION W/PHACO; Left ?    Comment:  Procedure: CATARACT EXTRACTION WITH PHACO AND  ?             INTRAOCULAR LENS PLACEMENT LEFT EYE CDE=1.79;  Surgeon:  ?             Williams Che, MD;  Location: [facility];  Service:  ?             Ophthalmology;  Laterality: Left; ?2012: CERVICAL SPINE SURGERY ?    Comment:  8 screws ?06/03/2013: CYSTOSCOPY WITH INJECTION; N/A ?    Comment:  Procedure: MACROPLASTIQUE  INJECTION ;  Surgeon: Marshall Cork  ?             Jeffie Pollock, MD;  [facility]: [facility];  Service: Urology;   ?             Laterality: N/A; ?10/20/2016: FISTULOGRAM; Right ?    Comment:  Procedure: FISTULOGRAM WITH POSSIBLE INTERVENTION;   ?             Surgeon: [name], MD;  [facility];   ?             Service: Vascular;  Laterality: Right; ?11/26/2015: INSERTION OF DIALYSIS CATHETER; N/A ?    Comment:  Procedure: INSERTION OF DIALYSIS CATHETER;  Surgeon:  ?             Angelia Mould, MD;  Location: [place];  Service:  ?             Vascular;  Laterality: N/A; ?12/29/2011: LAMINECTOMY ?12/29/2011: LUMBAR LAMINECTOMY/DECOMPRESSION MICRODISCECTOMY ?    Comment:  Procedure: LUMBAR LAMINECTOMY/DECOMPRESSION  ?             MICRODISCECTOMY;  Surgeon: Zigmund Daniel  Joya Salm, MD;   ?             Location: [place] NEUR[facility]S;  Service: Neurosurgery;   ?             Laterality: N/A;  Lumbar Two through Lumbar Five  ?             Laminectomies/Cellsaver ?No date: PARTIAL HYSTERECTOMY ?03/03/2020: PERIPHERAL VASCULAR BALLOON ANGIOPLASTY ?    Comment:  Procedure: PERIPHERAL VASCULAR BALLOON ANGIOPLASTY;   ?             Surgeon: Marty Heck, MD;  Location: [place] ?             CV LAB;  Service: Cardiovascular;;  Rt arm fistula ?04/15/2021: PERIPHERAL VASCULAR BALLOON ANGIOPLASTY; Right ?    Comment:  Procedure: PERIPHERAL VASCULAR BALLOON ANGIOPLASTY;   ?             Surgeon: Angelia Mould, MD;  Location: [facility]  ?             INVASIVE CV LAB;  Service: Cardiovascular;  Laterality:  ?              Right;  arm fistula ?11/22/2015: PERIPHERAL VASCULAR CATHETERIZATION; N/A ?    Comment:  Procedure: Fistulagram;  Surgeon: Angelia Mould, ?             MD;  Location: [place] CV LAB;  Service:  ?             Cardiovascular;  Laterality: N/A; ?08/10/2017: PERIPHERAL VASCULAR INTERVENTION ?    Comment:  Procedure: PERIPHERAL VASCULAR INTERVENTION;  Surgeon:  ?             Elam Dutch, MD;  Location: [place] CV LAB;   ?             Service: Cardiovascular;; ?No date: SHOULDER SURGERY; Right ?    Comment:  Rotator cuff  ? ? ? ?Chest Pain ?Associated symptoms: no abdominal pain, no cough, no dysphagia, no fever, no headache, no nausea, no palpitations, no shortness of breath and no vomiting   ? ?  ? ?Home Medications ?Prior to Admission medications   ?Medication Sig Start Date End Date Taking? Authorizing Provider  ?acetaminophen (TYLENOL) 500 MG tablet Take 1,000 mg by mouth every 6 (six) hours as needed for moderate pain or headache.    [provider]  ?albuterol (PROVENTIL) (2.5 MG/3ML) 0.083% nebulizer solution Take 3 mLs (2.5 mg total) by nebulization every 6 (six) hours as needed for wheezing or shortness of breath. 12/02/21   [name], MD  ?albuterol (VENTOLIN HFA) 108 (90 Base) MCG/ACT inhaler Inhale 2 puffs into the lungs every 6 (six) hours as needed for wheezing or shortness of breath. 12/02/21   [name], MD  ?aspirin 81 MG EC tablet Take 81 mg by mouth daily.    [provider]  ?atenolol (TENORMIN) 50 MG tablet Take 1 tablet (50 mg total) by mouth 2 (two) times daily. 09/15/21   [name], MD  ?budesonide-formoterol (SYMBICORT) 160-4.5 MCG/ACT inhaler Inhale 2 puffs into the lungs 2 (two) times daily. 11/15/21   [name], MD  ?busPIRone (BUSPAR) 15 MG tablet Take 1 tablet (15 mg total) by mouth 2 (two) times daily. 12/12/21   [name], MD  ?calcium acetate (PHOSLO) 667 MG capsule Take 667-1,334 mg by mouth See admin instructions.  Take 1334 mg with meals 3 times daily and 667 mg  with snacks    [provider]  ?Carboxymethylcellul-Glycerin (LUBRICATING EYE DROPS OP) Place 1 drop into both eyes daily as needed (dry eyes).

## 2022-03-05 ENCOUNTER — Inpatient Hospital Stay (HOSPITAL_COMMUNITY): Payer: Medicare Other

## 2022-03-05 DIAGNOSIS — E1122 Type 2 diabetes mellitus with diabetic chronic kidney disease: Secondary | ICD-10-CM

## 2022-03-05 DIAGNOSIS — I4891 Unspecified atrial fibrillation: Secondary | ICD-10-CM | POA: Diagnosis not present

## 2022-03-05 DIAGNOSIS — K219 Gastro-esophageal reflux disease without esophagitis: Secondary | ICD-10-CM | POA: Diagnosis not present

## 2022-03-05 DIAGNOSIS — E1169 Type 2 diabetes mellitus with other specified complication: Secondary | ICD-10-CM | POA: Diagnosis not present

## 2022-03-05 DIAGNOSIS — D72829 Elevated white blood cell count, unspecified: Secondary | ICD-10-CM

## 2022-03-05 DIAGNOSIS — I152 Hypertension secondary to endocrine disorders: Secondary | ICD-10-CM

## 2022-03-05 DIAGNOSIS — I200 Unstable angina: Secondary | ICD-10-CM | POA: Diagnosis not present

## 2022-03-05 DIAGNOSIS — Z992 Dependence on renal dialysis: Secondary | ICD-10-CM

## 2022-03-05 DIAGNOSIS — E1159 Type 2 diabetes mellitus with other circulatory complications: Secondary | ICD-10-CM

## 2022-03-05 DIAGNOSIS — E876 Hypokalemia: Secondary | ICD-10-CM

## 2022-03-05 DIAGNOSIS — E1121 Type 2 diabetes mellitus with diabetic nephropathy: Secondary | ICD-10-CM

## 2022-03-05 DIAGNOSIS — N186 End stage renal disease: Secondary | ICD-10-CM

## 2022-03-05 DIAGNOSIS — E785 Hyperlipidemia, unspecified: Secondary | ICD-10-CM

## 2022-03-05 LAB — ECHOCARDIOGRAM COMPLETE
AR max vel: 1.9 cm2
AV Area VTI: 1.93 cm2
AV Area mean vel: 1.89 cm2
AV Mean grad: 3 mmHg
AV Peak grad: 5.3 mmHg
Ao pk vel: 1.15 m/s
Height: 63 in
MV M vel: 5.21 m/s
MV Peak grad: 108.6 mmHg
MV VTI: 1.33 cm2
S' Lateral: 3.3 cm
Weight: 2952.4 oz

## 2022-03-05 LAB — CBC WITH DIFFERENTIAL/PLATELET
Abs Immature Granulocytes: 0.07 10*3/uL (ref 0.00–0.07)
Basophils Absolute: 0 10*3/uL (ref 0.0–0.1)
Basophils Relative: 0 %
Eosinophils Absolute: 0 10*3/uL (ref 0.0–0.5)
Eosinophils Relative: 0 %
HCT: 34.7 % — ABNORMAL LOW (ref 36.0–46.0)
Hemoglobin: 11.6 g/dL — ABNORMAL LOW (ref 12.0–15.0)
Immature Granulocytes: 1 %
Lymphocytes Relative: 13 %
Lymphs Abs: 1.7 10*3/uL (ref 0.7–4.0)
MCH: 28.2 pg (ref 26.0–34.0)
MCHC: 33.4 g/dL (ref 30.0–36.0)
MCV: 84.2 fL (ref 80.0–100.0)
Monocytes Absolute: 0.6 10*3/uL (ref 0.1–1.0)
Monocytes Relative: 4 %
Neutro Abs: 11.3 10*3/uL — ABNORMAL HIGH (ref 1.7–7.7)
Neutrophils Relative %: 82 %
Platelets: 223 10*3/uL (ref 150–400)
RBC: 4.12 MIL/uL (ref 3.87–5.11)
RDW: 17.1 % — ABNORMAL HIGH (ref 11.5–15.5)
WBC: 13.7 10*3/uL — ABNORMAL HIGH (ref 4.0–10.5)
nRBC: 0 % (ref 0.0–0.2)

## 2022-03-05 LAB — GLUCOSE, CAPILLARY
Glucose-Capillary: 233 mg/dL — ABNORMAL HIGH (ref 70–99)
Glucose-Capillary: 293 mg/dL — ABNORMAL HIGH (ref 70–99)
Glucose-Capillary: 164 mg/dL — ABNORMAL HIGH (ref 70–99)
Glucose-Capillary: 172 mg/dL — ABNORMAL HIGH (ref 70–99)

## 2022-03-05 LAB — HEPARIN LEVEL (UNFRACTIONATED): Heparin Unfractionated: 0.78 IU/mL — ABNORMAL HIGH (ref 0.30–0.70)

## 2022-03-05 LAB — MRSA NEXT GEN BY PCR, NASAL: MRSA by PCR Next Gen: NOT DETECTED

## 2022-03-05 LAB — TROPONIN I (HIGH SENSITIVITY): Troponin I (High Sensitivity): 181 ng/L (ref <18)

## 2022-03-05 MED ORDER — TORSEMIDE 20 MG PO TABS
20.0000 mg | ORAL_TABLET | Freq: Every day | ORAL | Status: DC
  Administered 2022-03-06: 20 mg via ORAL
  Filled 2022-03-05: qty 1

## 2022-03-05 MED ORDER — ROSUVASTATIN CALCIUM 5 MG PO TABS
5.0000 mg | ORAL_TABLET | Freq: Every day | ORAL | Status: DC
  Administered 2022-03-05 – 2022-03-15 (×11): 5 mg via ORAL
  Filled 2022-03-05 (×11): qty 1

## 2022-03-05 MED ORDER — ATENOLOL 50 MG PO TABS
50.0000 mg | ORAL_TABLET | Freq: Two times a day (BID) | ORAL | Status: DC
  Administered 2022-03-05 – 2022-03-10 (×11): 50 mg via ORAL
  Filled 2022-03-05 (×12): qty 1

## 2022-03-05 MED ORDER — FAMOTIDINE 20 MG PO TABS
20.0000 mg | ORAL_TABLET | Freq: Two times a day (BID) | ORAL | Status: DC
  Administered 2022-03-05 – 2022-03-08 (×6): 20 mg via ORAL
  Filled 2022-03-05 (×7): qty 1

## 2022-03-05 MED ORDER — CALCIUM ACETATE (PHOS BINDER) 667 MG PO CAPS
1334.0000 mg | ORAL_CAPSULE | Freq: Three times a day (TID) | ORAL | Status: DC
  Administered 2022-03-05 – 2022-03-15 (×19): 1334 mg via ORAL
  Filled 2022-03-05 (×17): qty 2

## 2022-03-05 MED ORDER — DILTIAZEM HCL 60 MG PO TABS
30.0000 mg | ORAL_TABLET | Freq: Four times a day (QID) | ORAL | Status: DC
  Administered 2022-03-05 – 2022-03-08 (×12): 30 mg via ORAL
  Filled 2022-03-05: qty 0.5
  Filled 2022-03-05 (×12): qty 1

## 2022-03-05 MED ORDER — CALCIUM ACETATE (PHOS BINDER) 667 MG PO CAPS
667.0000 mg | ORAL_CAPSULE | Freq: Two times a day (BID) | ORAL | Status: DC | PRN
  Filled 2022-03-05 (×2): qty 1

## 2022-03-05 MED ORDER — PROMETHAZINE HCL 25 MG PO TABS
12.5000 mg | ORAL_TABLET | Freq: Four times a day (QID) | ORAL | Status: DC | PRN
  Administered 2022-03-05 – 2022-03-14 (×5): 12.5 mg via ORAL
  Filled 2022-03-05 (×5): qty 1

## 2022-03-05 MED ORDER — SODIUM CHLORIDE 0.9% FLUSH: 3.0000 mL | Freq: Two times a day (BID) | INTRAVENOUS | Status: DC

## 2022-03-05 MED ORDER — HEPARIN BOLUS VIA INFUSION
4000.0000 [IU] | Freq: Once | INTRAVENOUS | Status: AC
  Administered 2022-03-05: 4000 [IU] via INTRAVENOUS

## 2022-03-05 MED ORDER — HYDROXYZINE HCL 10 MG PO TABS
10.0000 mg | ORAL_TABLET | Freq: Three times a day (TID) | ORAL | Status: DC | PRN
  Administered 2022-03-10 – 2022-03-14 (×9): 10 mg via ORAL
  Filled 2022-03-05 (×9): qty 1

## 2022-03-05 MED ORDER — ISOSORBIDE MONONITRATE ER 30 MG PO TB24
30.0000 mg | ORAL_TABLET | Freq: Every day | ORAL | Status: DC
  Administered 2022-03-05 – 2022-03-06 (×2): 30 mg via ORAL
  Filled 2022-03-05 (×2): qty 1

## 2022-03-05 MED ORDER — QUETIAPINE FUMARATE 100 MG PO TABS
100.0000 mg | ORAL_TABLET | Freq: Every day | ORAL | Status: DC
  Administered 2022-03-05 – 2022-03-14 (×10): 100 mg via ORAL
  Filled 2022-03-05 (×10): qty 1

## 2022-03-05 MED ORDER — OXYCODONE HCL 5 MG PO TABS
5.0000 mg | ORAL_TABLET | ORAL | Status: DC | PRN
  Administered 2022-03-05 – 2022-03-10 (×2): 5 mg via ORAL
  Filled 2022-03-05 (×2): qty 1

## 2022-03-05 MED ORDER — INSULIN ASPART 100 UNIT/ML IJ SOLN
0.0000 [IU] | Freq: Three times a day (TID) | INTRAMUSCULAR | Status: DC
  Administered 2022-03-05: 3 [IU] via SUBCUTANEOUS
  Administered 2022-03-05: 5 [IU] via SUBCUTANEOUS
  Administered 2022-03-05: 8 [IU] via SUBCUTANEOUS
  Administered 2022-03-06: 2 [IU] via SUBCUTANEOUS

## 2022-03-05 MED ORDER — ROPINIROLE HCL 1 MG PO TABS
1.0000 mg | ORAL_TABLET | Freq: Every day | ORAL | Status: DC
  Administered 2022-03-05 – 2022-03-14 (×10): 1 mg via ORAL
  Filled 2022-03-05 (×11): qty 1

## 2022-03-05 MED ORDER — LEVALBUTEROL HCL 0.63 MG/3ML IN NEBU
0.6300 mg | INHALATION_SOLUTION | Freq: Four times a day (QID) | RESPIRATORY_TRACT | Status: DC | PRN
  Administered 2022-03-09 – 2022-03-15 (×6): 0.63 mg via RESPIRATORY_TRACT
  Filled 2022-03-05 (×6): qty 3

## 2022-03-05 MED ORDER — MORPHINE SULFATE (PF) 2 MG/ML IV SOLN: 2.0000 mg | INTRAVENOUS | Status: DC | PRN

## 2022-03-05 MED ORDER — ACETAMINOPHEN 650 MG RE SUPP: 650.0000 mg | Freq: Four times a day (QID) | RECTAL | Status: DC | PRN

## 2022-03-05 MED ORDER — BUSPIRONE HCL 15 MG PO TABS
15.0000 mg | ORAL_TABLET | Freq: Two times a day (BID) | ORAL | Status: DC
Start: 2022-03-05 — End: 2022-03-14
  Administered 2022-03-05 – 2022-03-10 (×7): 15 mg via ORAL
  Filled 2022-03-05 (×17): qty 1

## 2022-03-05 MED ORDER — SODIUM CHLORIDE 0.9% FLUSH: 3.0000 mL | INTRAVENOUS | Status: DC | PRN

## 2022-03-05 MED ORDER — SODIUM CHLORIDE 0.9 % IV SOLN: INTRAVENOUS | Status: DC

## 2022-03-05 MED ORDER — PANTOPRAZOLE SODIUM 40 MG PO TBEC
40.0000 mg | DELAYED_RELEASE_TABLET | Freq: Every day | ORAL | Status: DC
  Administered 2022-03-05 – 2022-03-15 (×11): 40 mg via ORAL
  Filled 2022-03-05 (×12): qty 1

## 2022-03-05 MED ORDER — ASPIRIN 81 MG PO CHEW
81.0000 mg | CHEWABLE_TABLET | ORAL | Status: AC
  Administered 2022-03-06: 81 mg via ORAL
  Filled 2022-03-05: qty 1

## 2022-03-05 MED ORDER — NITROGLYCERIN 0.4 MG SL SUBL
0.4000 mg | SUBLINGUAL_TABLET | SUBLINGUAL | Status: DC | PRN
  Administered 2022-03-05 (×2): 0.4 mg via SUBLINGUAL
  Filled 2022-03-05 (×2): qty 1

## 2022-03-05 MED ORDER — INSULIN ASPART 100 UNIT/ML IJ SOLN: 0.0000 [IU] | Freq: Every day | INTRAMUSCULAR | Status: DC

## 2022-03-05 MED ORDER — HEPARIN BOLUS VIA INFUSION
2000.0000 [IU] | Freq: Once | INTRAVENOUS | Status: AC
  Administered 2022-03-05: 2000 [IU] via INTRAVENOUS
  Filled 2022-03-05: qty 2000

## 2022-03-05 MED ORDER — GABAPENTIN 400 MG PO CAPS
400.0000 mg | ORAL_CAPSULE | Freq: Every day | ORAL | Status: DC
Start: 2022-03-05 — End: 2022-03-08
  Administered 2022-03-06 – 2022-03-07 (×2): 400 mg via ORAL
  Filled 2022-03-05 (×3): qty 1

## 2022-03-05 MED ORDER — ASPIRIN EC 81 MG PO TBEC
81.0000 mg | DELAYED_RELEASE_TABLET | Freq: Every day | ORAL | Status: DC
  Administered 2022-03-05 – 2022-03-07 (×3): 81 mg via ORAL
  Filled 2022-03-05 (×3): qty 1

## 2022-03-05 MED ORDER — HEPARIN (PORCINE) 25000 UT/250ML-% IV SOLN
1100.0000 [IU]/h | INTRAVENOUS | Status: DC
  Administered 2022-03-05: 1000 [IU]/h via INTRAVENOUS
  Administered 2022-03-05: 1250 [IU]/h via INTRAVENOUS
  Filled 2022-03-05 (×2): qty 250

## 2022-03-05 MED ORDER — ACETAMINOPHEN 325 MG PO TABS
650.0000 mg | ORAL_TABLET | Freq: Four times a day (QID) | ORAL | Status: DC | PRN
  Administered 2022-03-05 – 2022-03-12 (×4): 650 mg via ORAL
  Filled 2022-03-05 (×4): qty 2

## 2022-03-05 MED ORDER — SODIUM CHLORIDE 0.9 % IV SOLN: 250.0000 mL | INTRAVENOUS | Status: DC | PRN

## 2022-03-05 MED ORDER — INSULIN DETEMIR 100 UNIT/ML ~~LOC~~ SOLN
15.0000 [IU] | Freq: Every day | SUBCUTANEOUS | Status: DC
  Administered 2022-03-05 – 2022-03-14 (×10): 15 [IU] via SUBCUTANEOUS
  Filled 2022-03-05 (×12): qty 0.15

## 2022-03-05 NOTE — Progress Notes (Signed)
ANTICOAGULATION CONSULT NOTE  ? ?Pharmacy Consult for Heparin ?Indication: atrial fibrillation ? ?Allergies  ?Allergen Reactions  ? Azithromycin   ?  Prolonged QT  ? Ace Inhibitors Cough  ? Clopidogrel Bisulfate Other (See Comments)  ?  Sick, Headache, "Felt terrible"  ? Codeine Other (See Comments)  ?  hallucinations  ? Lisinopril Cough  ? Penicillins Other (See Comments)  ?  Unknown reaction ?Did it involve swelling of the face/tongue/throat, SOB, or low BP? Unknown ?Did it involve sudden or severe rash/hives, skin peeling, or any reaction on the inside of your mouth or nose? Unknown ?Did you need to seek medical attention at a hospital or doctor's office? Unknown ?When did it last happen?      childhood allergy ?If all above answers are "NO", may proceed with cephalosporin use. ? ?  ? ? ?Patient Measurements: ?Height: 5\' 3"  (160 cm) ?Weight: 83.7 kg (184 lb 8.4 oz) ?IBW/kg (Calculated) : 52.4 ?Heparin Dosing Weight: 71 kg ? ?Vital Signs: ?Temp: 97.7 ?F (36.5 ?C) (04/02 7858) ?Temp Source: Oral (04/02 8502) ?BP: 151/57 (04/02 0704) ?Pulse Rate: 84 (04/02 0600) ? ?Labs: ?Recent Labs  ?  03/04/22 ?2041 03/04/22 ?2246 03/05/22 ?7741 03/05/22 ?0740  ?HGB 12.3  --   --  11.6*  ?HCT 38.1  --   --  34.7*  ?PLT 266  --   --  223  ?CREATININE 6.09*  --   --   --   ?TROPONINIHS 99* 105* 181*  --   ? ? ? ?Estimated Creatinine Clearance: 8.9 mL/min (A) (by C-G formula based on SCr of 6.09 mg/dL (H)). ? ? ?Medical History: ?Past Medical History:  ?Diagnosis Date  ? Acute on chronic diastolic CHF (congestive heart failure) ([place]) 03/05/2015  ? Grade 2. EF 60-65%  ? Anemia   ? hx of  ? Anxiety   ? Arthritis   ? CAD (coronary artery disease)   ? s/p Stenting in 2005;  [place] 7/09: Vigorous LV function, 2-3+ MR, RI 40%, RI stent patent, proximal-mid RCA 40-50%;  E[place] 7/09:  EF 55-65%, trivial MR;  Myoview 11/18/12: EF 66%, normal LV wall motion, mild to moderate anterior ischemia - reviewed by [facility] and felt to rep breast atten (low risk);   Echo 6/14: mild LVH, EF 55-65%, Gr 2 DD, PASP 44, trivial eff   ? Chronic kidney disease   ? CREATININE IS UP--GOING TO KIDNEY MD   ? Chronic neck pain   ? COPD (chronic obstructive pulmonary disease) ([place])   ? Degenerative joint disease   ? left shoulder  ? Diabetic neuropathy ([place])   ? Diverticulosis   ? Esophageal stricture   ? GERD (gastroesophageal reflux disease)   ? Hyperlipidemia   ? Hypertension   ? IDDM (insulin dependent diabetes mellitus)   ? Pericardial effusion 03/06/2015  ? Very small per echo ; pleural nodules seen on CT chest.  ? Peripheral vascular disease ([place])   ? Stroke [facility])   ? "mini- years ago"  ? Vitamin D deficiency   ? ? ? ?Assessment: ?71 y.o. F presents with CP.  Found to be in afib with RVR. CHADS2VASC score is 6. CBC stable. No AC PTA. Pt is ESRD pt. ?-heparin level < 0.1 (collected early) ?-hg= 11.6 ? ?Goal of Therapy:  ?Heparin level 0.3-0.7 units/ml ?Monitor platelets by anticoagulation protocol: Yes ?  ?Plan:  ?-heparin 2000 units IV x1 then increase to 1250 units/hr ?-Heparin level in 8 hours and daily wth CBC daily ? ?Mitzi Hansen  Bjorn Loser, PharmD ?Clinical Pharmacist ?**Pharmacist phone directory can now be found on [email or web address] ([email or web address]).  Listed under [place]. ? ? ? ? ? ?

## 2022-03-05 NOTE — Assessment & Plan Note (Addendum)
Leukocytosis at 15->13, afebrile ?When seen in the hospital few days ago was 11.7 ?No infectious symptoms ?Chest x-ray is without evidence of acute cardiopulmonary disease ?Clinically, she does not appear to have an acute infection ? ? ?

## 2022-03-05 NOTE — Progress Notes (Signed)
Pt complaining of 8/10 chest tightness, nausea, and said she's feeling "flushed," but refusing pain meds. MD notified and suggested pt try nitro. RN spoke w/ pt again and pt is willing to try nitro. Another MD to bedside. RN gave antiemetic and SL nitro and will continue to monitor. ?

## 2022-03-05 NOTE — Consult Note (Signed)
?Cardiology Consult:  ? ?Patient ID: [name] ?MRN: [id number]; DOB: 20-Mar-1951  ? ?Admission date: 03/04/2022 ? ?PCP:  [name], MD ?  ?[facility] Providers ?Cardiologist:  Minus Breeding, MD      ? ? ?Chief Complaint:  Chest pain ? ?Patient Profile:  ? ?[name] is a 71 y.o. female with CAD (multiple prior cath with RI PCI),  HTN, Acute on Chronic Diastolic Heart Failure, prior CVA, PAD, with COPD who is being seen 03/05/2022 for the evaluation of new PAF CHADSVASC 6, with chest pain at HD. ? ?History of Present Illness:  ? ?Ms. [name] notes that she is feeling terrible.  She notes that since seeing Dr. Percival Spanish 02/22/22.  Notes that that she has had  felt flush with chest pressure that now occurs even outside of dialysis.  In the past in only occurred with HD and was stable. ? ?She had noted palpitations at last visit; this was worsened as well and was found to have new atrial fibrillation (she was started on diltiazem drip) ? ?Through her evaluations she has had multiple episodes of shortness of breath.  On exam now she denies this bust has had ED eval 3/28 and 3/29 for this.   ? ?She notes she is on too many stress and anxiety medications. ? ?Was seen again in the ED 03/04/22:  post dialysis chest pressure ? Found to have new AF RVR.  Started on diltiazem drip.  Found to have low normal LVEF and Decrease RV function.  Called for evaluation. ? ? ? ?Past Medical History:  ?Diagnosis Date  ? Acute on chronic diastolic CHF (congestive heart failure) ([place]) 03/05/2015  ? Grade 2. EF 60-65%  ? Anemia   ? hx of  ? Anxiety   ? Arthritis   ? CAD (coronary artery disease)   ? s/p Stenting in 2005;  [place] 7/09: Vigorous LV function, 2-3+ MR, RI 40%, RI stent patent, proximal-mid RCA 40-50%;  E[place] 7/09:  EF 55-65%, trivial MR;  Myoview 11/18/12: EF 66%, normal LV wall motion, mild to moderate anterior ischemia - reviewed by [facility] and felt to rep breast atten (low risk);  Echo 6/14: mild LVH, EF 55-65%, Gr 2 DD,  PASP 44, trivial eff   ? Chronic kidney disease   ? CREATININE IS UP--GOING TO KIDNEY MD   ? Chronic neck pain   ? COPD (chronic obstructive pulmonary disease) ([place])   ? Degenerative joint disease   ? left shoulder  ? Diabetic neuropathy ([place])   ? Diverticulosis   ? Esophageal stricture   ? GERD (gastroesophageal reflux disease)   ? Hyperlipidemia   ? Hypertension   ? IDDM (insulin dependent diabetes mellitus)   ? Pericardial effusion 03/06/2015  ? Very small per echo ; pleural nodules seen on CT chest.  ? Peripheral vascular disease ([place])   ? Stroke [facility])   ? "mini- years ago"  ? Vitamin D deficiency   ? ? ?Past Surgical History:  ?Procedure Laterality Date  ? A/V FISTULAGRAM N/A 08/10/2017  ? Procedure: A/V Fistulagram - Right Arm;  Surgeon: Elam Dutch, MD;  Location: [place] CV LAB;  Service: Cardiovascular;  Laterality: N/A;  ? A/V FISTULAGRAM N/A 03/03/2020  ? Procedure: A/V FISTULAGRAM - Right Arm;  Surgeon: Marty Heck, MD;  Location: [place] CV LAB;  Service: Cardiovascular;  Laterality: N/A;  ? ABDOMINAL HYSTERECTOMY    ? AV FISTULA PLACEMENT Right 07/20/2015  ? Procedure: RADIAL CEPHALIC ARTERIOVENOUS FISTULA  CREATION RIGHT ARM;  Surgeon: Angelia Mould, MD;  Location: [place];  Service: Vascular;  Laterality: Right;  ? AV FISTULA PLACEMENT Right 11/26/2015  ? Procedure:  Creation of RIGHT BRACHIO-CEPHALIC arteriovenous fistula;  Surgeon: Angelia Mould, MD;  Location: [place];  Service: Vascular;  Laterality: Right;  ? BACK SURGERY    ? 2007 & 2013  ? BLADDER SURGERY    ? Tack  ? CARDIAC CATHETERIZATION  2003, 2005, 2009  ? 1 stent  ? cardiac stents    ? CATARACT EXTRACTION W/PHACO Right 05/04/2014  ? Procedure: CATARACT EXTRACTION PHACO AND INTRAOCULAR LENS PLACEMENT ([facility]);  Surgeon: Williams Che, MD;  [facility];  Service: Ophthalmology;  Laterality: Right;  CDE 1.47  ? CATARACT EXTRACTION W/PHACO Left 07/20/2014  ? Procedure: CATARACT EXTRACTION WITH PHACO AND  INTRAOCULAR LENS PLACEMENT LEFT EYE CDE=1.79;  Surgeon: Williams Che, MD;  [facility];  Service: Ophthalmology;  Laterality: Left;  ? CERVICAL SPINE SURGERY  2012  ? 8 screws  ? CYSTOSCOPY WITH INJECTION N/A 06/03/2013  ? Procedure: MACROPLASTIQUE  INJECTION ;  Surgeon: Malka So, MD;  [facility];  Service: Urology;  Laterality: N/A;  ? FISTULOGRAM Right 10/20/2016  ? Procedure: FISTULOGRAM WITH POSSIBLE INTERVENTION;  Surgeon: [name], MD;  [facility];  Service: Vascular;  Laterality: Right;  ? INSERTION OF DIALYSIS CATHETER N/A 11/26/2015  ? Procedure: INSERTION OF DIALYSIS CATHETER;  Surgeon: Angelia Mould, MD;  Location: [place];  Service: Vascular;  Laterality: N/A;  ? LAMINECTOMY  12/29/2011  ? LUMBAR LAMINECTOMY/DECOMPRESSION MICRODISCECTOMY  12/29/2011  ? Procedure: LUMBAR LAMINECTOMY/DECOMPRESSION MICRODISCECTOMY;  Surgeon: Floyce Stakes, MD;  Location: [place] [facility];  Service: Neurosurgery;  Laterality: N/A;  Lumbar Two through Lumbar Five Laminectomies/Cellsaver  ? PARTIAL HYSTERECTOMY    ? PERIPHERAL VASCULAR BALLOON ANGIOPLASTY  03/03/2020  ? Procedure: PERIPHERAL VASCULAR BALLOON ANGIOPLASTY;  Surgeon: Marty Heck, MD;  Location: [place] CV LAB;  Service: Cardiovascular;;  Rt arm fistula  ? PERIPHERAL VASCULAR BALLOON ANGIOPLASTY Right 04/15/2021  ? Procedure: PERIPHERAL VASCULAR BALLOON ANGIOPLASTY;  Surgeon: Angelia Mould, MD;  Location: [place] CV LAB;  Service: Cardiovascular;  Laterality: Right;  arm fistula  ? PERIPHERAL VASCULAR CATHETERIZATION N/A 11/22/2015  ? Procedure: Fistulagram;  Surgeon: Angelia Mould, MD;  Location: [place] CV LAB;  Service: Cardiovascular;  Laterality: N/A;  ? PERIPHERAL VASCULAR INTERVENTION  08/10/2017  ? Procedure: PERIPHERAL VASCULAR INTERVENTION;  Surgeon: Elam Dutch, MD;  Location: [place] CV LAB;  Service: Cardiovascular;;  ? SHOULDER SURGERY Right   ? Rotator cuff  ?   ? ?Medications Prior to Admission: ?Prior to Admission medications   ?Medication Sig Start Date End Date Taking? Authorizing Provider  ?acetaminophen (TYLENOL) 500 MG tablet Take 1,000 mg by mouth every 6 (six) hours as needed for moderate pain or headache.    [provider]  ?albuterol (PROVENTIL) (2.5 MG/3ML) 0.083% nebulizer solution Take 3 mLs (2.5 mg total) by nebulization every 6 (six) hours as needed for wheezing or shortness of breath. 12/02/21   [name], MD  ?albuterol (VENTOLIN HFA) 108 (90 Base) MCG/ACT inhaler Inhale 2 puffs into the lungs every 6 (six) hours as needed for wheezing or shortness of breath. 12/02/21   [name], MD  ?aspirin 81 MG EC tablet Take 81 mg by mouth daily.    [provider]  ?atenolol (TENORMIN) 50 MG tablet Take 1 tablet (50 mg total) by  mouth 2 (two) times daily. 09/15/21   [name], MD  ?budesonide-formoterol (SYMBICORT) 160-4.5 MCG/ACT inhaler Inhale 2 puffs into the lungs 2 (two) times daily. 11/15/21   [name], MD  ?busPIRone (BUSPAR) 15 MG tablet Take 1 tablet (15 mg total) by mouth 2 (two) times daily. 12/12/21   [name], MD  ?calcium acetate (PHOSLO) 667 MG capsule Take 667-1,334 mg by mouth See admin instructions. Take 1334 mg with meals 3 times daily and 667 mg with snacks    [provider]  ?Carboxymethylcellul-Glycerin (LUBRICATING EYE DROPS OP) Place 1 drop into both eyes daily as needed (dry eyes).    [provider]  ?cetirizine (ZYRTEC) 10 MG tablet Take 1 tablet (10 mg total) by mouth daily as needed for allergies. 02/08/22   [name], MD  ?Continuous Blood Gluc Receiver (FREESTYLE LIBRE 2 READER) DEVI USE TO TEST BLOOD SUGAR CONTINUOUSLY 11/15/21   [name], MD  ?Continuous Blood Gluc Sensor (FREESTYLE LIBRE 2 SENSOR) MISC Use multiple times daily to track glucose to prevent highs and lows as a complication of dialysis Dx:Z99.2, E11.59 09/15/21   [name], MD  ?famotidine  (PEPCID) 20 MG tablet Take 1 tablet (20 mg total) by mouth 2 (two) times daily. 02/15/22   Dettinger, Fransisca Kaufmann, MD  ?gabapentin (NEURONTIN) 400 MG capsule Take 1 capsule (400 mg total) by mouth See admin instructions. Take it

## 2022-03-05 NOTE — Progress Notes (Signed)
?  Echocardiogram ?2D Echocardiogram has been performed. ? ?[name] ?03/05/2022, 11:10 AM ?

## 2022-03-05 NOTE — Consult Note (Addendum)
[facility] ?Renal Consultation Note  ?  ?Indication for Consultation:  Management of ESRD/hemodialysis, anemia, hypertension/volume, and secondary hyperparathyroidism. ? ?HPI: [name] is a 71 y.o. female with PMH including ESRD on dialysis, CHF, CAD, COPD, T2DM with neuopathy, PVD, HTN and CVA who presented to [facility] ED after dialysis on Saturday with chest tightness. She reported this happened after dialysis on Thursday as well. She reported heart palpitations off and on for a month. Chest tightness was associated with diaphoresis and nausea. On presentation to the ED, she was should to have a.fib with RVR. Troponin elevated, suspected to be demand ischemia. Potassium was slightly low at 3.3. She was transferred to [facility] since [facility] does not have inpatient cardiology over the weekends. Patient attends dialysis on TTS at [facility] and did have a full session yesterday. Nephrology has been consulted for management of ESRD. ? ?Patient reports she has had several episodes of heart palpitations and chest tightness at dialysis.  She reports symptoms usually improve when they turn her UF off and give her oxygen.  She does not think her blood pressure was dropping but she does report the nurse sometimes told her her heart rate was high.  Reports her dry weight is 84 kg but she has been getting below that at dialysis recently.  Typically has UF goals of 3 L.  At present she does not have any chest pain, palpitations, dizziness, abdominal pain, nausea, vomiting, diarrhea.  Heart rate is in the 70s on cardiac monitor.  She also complains of difficulty sleeping and thinks she is on too many meds for anxiety.  She recounts all of this for me as well- has been on HD for 6 years and only recently has had issues -  is still having chest tightness-  tells me that cards said would do a cath  ? ?Past Medical History:  ?Diagnosis Date  ? Acute on chronic diastolic CHF (congestive heart failure) ([place])  03/05/2015  ? Grade 2. EF 60-65%  ? Anemia   ? hx of  ? Anxiety   ? Arthritis   ? CAD (coronary artery disease)   ? s/p Stenting in 2005;  [place] 7/09: Vigorous LV function, 2-3+ MR, RI 40%, RI stent patent, proximal-mid RCA 40-50%;  Echo 7/09:  EF 55-65%, trivial MR;  Myoview 11/18/12: EF 66%, normal LV wall motion, mild to moderate anterior ischemia - reviewed by [facility] and felt to rep breast atten (low risk);  Echo 6/14: mild LVH, EF 55-65%, Gr 2 DD, PASP 44, trivial eff   ? Chronic kidney disease   ? CREATININE IS UP--GOING TO KIDNEY MD   ? Chronic neck pain   ? COPD (chronic obstructive pulmonary disease) ([place])   ? Degenerative joint disease   ? left shoulder  ? Diabetic neuropathy ([place])   ? Diverticulosis   ? Esophageal stricture   ? GERD (gastroesophageal reflux disease)   ? Hyperlipidemia   ? Hypertension   ? IDDM (insulin dependent diabetes mellitus)   ? Pericardial effusion 03/06/2015  ? Very small per echo ; pleural nodules seen on CT chest.  ? Peripheral vascular disease ([place])   ? Stroke [facility])   ? "mini- years ago"  ? Vitamin D deficiency   ? ?Past Surgical History:  ?Procedure Laterality Date  ? A/V FISTULAGRAM N/A 08/10/2017  ? Procedure: A/V Fistulagram - Right Arm;  Surgeon: Elam Dutch, MD;  Location: [place] [facility];  Service: Cardiovascular;  Laterality: N/A;  ?  A/V FISTULAGRAM N/A 03/03/2020  ? Procedure: A/V FISTULAGRAM - Right Arm;  Surgeon: Marty Heck, MD;  Location: [place] [facility];  Service: Cardiovascular;  Laterality: N/A;  ? ABDOMINAL HYSTERECTOMY    ? AV FISTULA PLACEMENT Right 07/20/2015  ? Procedure: RADIAL CEPHALIC ARTERIOVENOUS FISTULA CREATION RIGHT ARM;  Surgeon: Angelia Mould, MD;  Location: [place];  Service: Vascular;  Laterality: Right;  ? AV FISTULA PLACEMENT Right 11/26/2015  ? Procedure:  Creation of RIGHT BRACHIO-CEPHALIC arteriovenous fistula;  Surgeon: Angelia Mould, MD;  Location: [place];  Service: Vascular;  Laterality: Right;  ? BACK SURGERY    ?  2007 & 2013  ? BLADDER SURGERY    ? Tack  ? CARDIAC CATHETERIZATION  2003, 2005, 2009  ? 1 stent  ? cardiac stents    ? CATARACT EXTRACTION W/PHACO Right 05/04/2014  ? Procedure: CATARACT EXTRACTION PHACO AND INTRAOCULAR LENS PLACEMENT (IOC);  Surgeon: Williams Che, MD;  Location: [facility];  Service: Ophthalmology;  Laterality: Right;  CDE 1.47  ? CATARACT EXTRACTION W/PHACO Left 07/20/2014  ? Procedure: CATARACT EXTRACTION WITH PHACO AND INTRAOCULAR LENS PLACEMENT LEFT EYE CDE=1.79;  Surgeon: Williams Che, MD;  Location: [facility];  Service: Ophthalmology;  Laterality: Left;  ? CERVICAL SPINE SURGERY  2012  ? 8 screws  ? CYSTOSCOPY WITH INJECTION N/A 06/03/2013  ? Procedure: MACROPLASTIQUE  INJECTION ;  Surgeon: Malka So, MD;  [facility];  Service: Urology;  Laterality: N/A;  ? FISTULOGRAM Right 10/20/2016  ? Procedure: FISTULOGRAM WITH POSSIBLE INTERVENTION;  Surgeon: [name], MD;  Location: [facility];  Service: Vascular;  Laterality: Right;  ? INSERTION OF DIALYSIS CATHETER N/A 11/26/2015  ? Procedure: INSERTION OF DIALYSIS CATHETER;  Surgeon: Angelia Mould, MD;  Location: [place];  Service: Vascular;  Laterality: N/A;  ? LAMINECTOMY  12/29/2011  ? LUMBAR LAMINECTOMY/DECOMPRESSION MICRODISCECTOMY  12/29/2011  ? Procedure: LUMBAR LAMINECTOMY/DECOMPRESSION MICRODISCECTOMY;  Surgeon: Floyce Stakes, MD;  Location: [place] [facility];  Service: Neurosurgery;  Laterality: N/A;  Lumbar Two through Lumbar Five Laminectomies/Cellsaver  ? PARTIAL HYSTERECTOMY    ? PERIPHERAL VASCULAR BALLOON ANGIOPLASTY  03/03/2020  ? Procedure: PERIPHERAL VASCULAR BALLOON ANGIOPLASTY;  Surgeon: Marty Heck, MD;  Location: [place] [facility];  Service: Cardiovascular;;  Rt arm fistula  ? PERIPHERAL VASCULAR BALLOON ANGIOPLASTY Right 04/15/2021  ? Procedure: PERIPHERAL VASCULAR BALLOON ANGIOPLASTY;  Surgeon: Angelia Mould, MD;  Location: [place] [facility];  Service: Cardiovascular;  Laterality: Right;  arm  fistula  ? PERIPHERAL VASCULAR CATHETERIZATION N/A 11/22/2015  ? Procedure: Fistulagram;  Surgeon: Angelia Mould, MD;  Location: [place] [facility];  Service: Cardiovascular;  Laterality: N/A;  ? PERIPHERAL VASCULAR INTERVENTION  08/10/2017  ? Procedure: PERIPHERAL VASCULAR INTERVENTION;  Surgeon: Elam Dutch, MD;  Location: [place] [facility];  Service: Cardiovascular;;  ? SHOULDER SURGERY Right   ? Rotator cuff  ? ?Family History  ?Problem Relation Age of Onset  ? Heart disease Other   ? Hypertension Father   ? Cancer Father   ? Hypertension Mother   ? Hypertension Daughter   ? Pancreatitis Brother   ? Hypertension Daughter   ? Diabetes Daughter   ? ?Social History: ? reports that she quit smoking about 20 years ago. Her smoking use included cigarettes. She has a 30.00 pack-year smoking history. She has never used smokeless tobacco. She reports that she does not drink alcohol and does not use drugs. ? ?ROS: As  per HPI otherwise negative. ? ?Physical Exam: ?Vitals:  ? 03/05/22 0600 03/05/22 0704 03/05/22 1120 03/05/22 1134  ?BP: (!) 174/70 (!) 151/57 (!) 168/75   ?Pulse: 84  66 81  ?Resp: 13 14 14    ?Temp: 98.2 ?F (36.8 ?C) 97.7 ?F (36.5 ?C) 97.7 ?F (36.5 ?C)   ?TempSrc: Oral Oral Oral   ?SpO2: 98%     ?Weight:  83.7 kg    ?Height:  5\' 3"  (1.6 m)    ?   ?General: Well developed, well nourished, in no acute distress. Eating lunch. ?Head: Normocephalic, atraumatic, sclera non-icteric, mucus membranes are moist. ?Neck: JVD not elevated. ?Lungs: Clear bilaterally to auscultation without wheezes, rales, or rhonchi. Breathing is unlabored on O2 2L. ?Heart: RRR with normal S1, S2. No murmurs, rubs, or gallops appreciated. ?Abdomen: Soft, non-tender, non-distended with normoactive bowel sounds. No obvious abdominal masses. ?Musculoskeletal:  Strength and tone appear normal for age. ?Lower extremities: No edema bilateral lower extremities ?Neuro: Alert and oriented X 3. Moves all extremities  spontaneously. ?Psych:  Responds to questions appropriately with a normal affect. ?Dialysis Access: RUE AVF +t/b ? ?Allergies  ?Allergen Reactions  ? Azithromycin   ?  Prolonged QT  ? Ace Inhibitors Cough  ? Clopidogrel Bisulfate

## 2022-03-05 NOTE — Progress Notes (Signed)
Patient admitted to the hospital earlier this morning by Dr. Clearence Ped ? ?Patient seen and examined.  She reports persistent chest tightness since onset yesterday.  She has had associated shortness of breath. ? ?71 year old female with end-stage renal disease on hemodialysis, had her scheduled treatment yesterday without any issues.  Yesterday evening, she began to develop chest tightness.  She was evaluated emergency room where she was noted to have new onset atrial fibrillation.  Serial troponins have been checked and had noted to be trending up.  Transferred from [facility] to [facility] for further cardiac evaluation.  Currently on diltiazem and heparin drip.  Cardiology and nephrology have been consulted. ? ?[name] ? ?

## 2022-03-05 NOTE — Assessment & Plan Note (Signed)
Continue Pepcid and Protonix ?Continue to monitor ?

## 2022-03-05 NOTE — Plan of Care (Signed)
?  Problem: Education: ?Goal: Understanding of medication regimen will improve ?Outcome: Progressing ?  ?Problem: Activity: ?Goal: Ability to tolerate increased activity will improve ?Outcome: Progressing ?  ?Problem: Education: ?Goal: Understanding of CV disease, CV risk reduction, and recovery process will improve ?Outcome: Progressing ?Goal: Individualized Educational Video(s) ?Outcome: Progressing ?  ?Problem: Health Behavior/Discharge Planning: ?Goal: Ability to safely manage health-related needs after discharge will improve ?Outcome: Progressing ?  ?

## 2022-03-05 NOTE — Assessment & Plan Note (Addendum)
Heart rate is better controlled ?She is on diltiazem for rate control ?She is also chronically on atenolol ?She is on heparin infusion for anticoagulation ?Will need to be started on eliquis post cath ?Echo shows normal EF, but does comment on RV systolic function moderate to severely reduced and severe pulmonary hypertension ?TSH 4.7 ?

## 2022-03-05 NOTE — Assessment & Plan Note (Signed)
Patient uses 25 units of basal insulin at home ?Reduced to 15 units basal insulin while in the hospital ?Sliding scale coverage ?Continue gabapentin for diabetic neuropathy ?Carb modified diet ?Monitor CBGs ?

## 2022-03-05 NOTE — Assessment & Plan Note (Signed)
Continue atenolol, torsemide ?Continue to monitor ?

## 2022-03-05 NOTE — Assessment & Plan Note (Addendum)
Normal HD schedule is TTS ?Most recent HD session 4/4 ?Nephrology following ?

## 2022-03-05 NOTE — ED Notes (Signed)
Pt c/o back pain

## 2022-03-05 NOTE — Progress Notes (Signed)
?  Transition of Care (TOC) Screening Note ? ? ?Patient Details  ?Name: [name] ?Date of Birth: 12-09-1950 ? ? ?Transition of Care (TOC) CM/SW Contact:    ?Bary Castilla, LCSW ?Phone Number: ?03/05/2022, 12:39 PM ? ? ? ?Transition of Care Department [facility]) has reviewed patient and no TOC needs have been identified at this time. We will continue to monitor patient advancement through interdisciplinary progression rounds. If new patient transition needs arise, please place a TOC consult. ?  ?

## 2022-03-05 NOTE — Progress Notes (Signed)
ANTICOAGULATION CONSULT NOTE  ? ?Pharmacy Consult for Heparin ?Indication: atrial fibrillation ? ?Allergies  ?Allergen Reactions  ? Azithromycin   ?  Prolonged QT  ? Ace Inhibitors Cough  ? Clopidogrel Bisulfate Other (See Comments)  ?  Sick, Headache, "Felt terrible"  ? Codeine Other (See Comments)  ?  hallucinations  ? Lisinopril Cough  ? Penicillins Other (See Comments)  ?  Unknown reaction ?Did it involve swelling of the face/tongue/throat, SOB, or low BP? Unknown ?Did it involve sudden or severe rash/hives, skin peeling, or any reaction on the inside of your mouth or nose? Unknown ?Did you need to seek medical attention at a hospital or doctor's office? Unknown ?When did it last happen?      childhood allergy ?If all above answers are "NO", may proceed with cephalosporin use. ? ?  ? ? ?Patient Measurements: ?Height: 5\' 3"  (160 cm) ?Weight: 83.7 kg (184 lb 8.4 oz) ?IBW/kg (Calculated) : 52.4 ?Heparin Dosing Weight: 71 kg ? ?Vital Signs: ?Temp: 97.8 ?F (36.6 ?C) (04/02 1945) ?Temp Source: Oral (04/02 1945) ?BP: 164/69 (04/02 1945) ?Pulse Rate: 63 (04/02 1945) ? ?Labs: ?Recent Labs  ?  03/04/22 ?2041 03/04/22 ?2246 03/05/22 ?0323 03/05/22 ?0740 03/05/22 ?2004  ?HGB 12.3  --   --  11.6*  --   ?HCT 38.1  --   --  34.7*  --   ?PLT 266  --   --  223  --   ?HEPARINUNFRC  --   --   --   --  0.78*  ?CREATININE 6.09*  --   --   --   --   ?TROPONINIHS 99* 105* 181*  --   --   ? ? ? ?Estimated Creatinine Clearance: 8.9 mL/min (A) (by C-G formula based on SCr of 6.09 mg/dL (H)). ? ? ?Medical History: ?Past Medical History:  ?Diagnosis Date  ? Acute on chronic diastolic CHF (congestive heart failure) ([place]) 03/05/2015  ? Grade 2. EF 60-65%  ? Anemia   ? hx of  ? Anxiety   ? Arthritis   ? CAD (coronary artery disease)   ? s/p Stenting in 2005;  [place] 7/09: Vigorous LV function, 2-3+ MR, RI 40%, RI stent patent, proximal-mid RCA 40-50%;  [place] 7/09:  EF 55-65%, trivial MR;  Myoview 11/18/12: EF 66%, normal LV wall motion, mild to  moderate anterior ischemia - reviewed by [facility] and felt to rep breast atten (low risk);  Echo 6/14: mild LVH, EF 55-65%, Gr 2 DD, PASP 44, trivial eff   ? Chronic kidney disease   ? CREATININE IS UP--GOING TO KIDNEY MD   ? Chronic neck pain   ? COPD (chronic obstructive pulmonary disease) ([place])   ? Degenerative joint disease   ? left shoulder  ? Diabetic neuropathy ([place])   ? Diverticulosis   ? Esophageal stricture   ? GERD (gastroesophageal reflux disease)   ? Hyperlipidemia   ? Hypertension   ? IDDM (insulin dependent diabetes mellitus)   ? Pericardial effusion 03/06/2015  ? Very small per echo ; pleural nodules seen on CT chest.  ? Peripheral vascular disease ([place])   ? Stroke [facility])   ? "mini- years ago"  ? Vitamin D deficiency   ? ? ? ?Assessment: ?71 y.o. F presents with CP.  Found to be in afib with RVR. CHADS2VASC score is 6. CBC stable. No AC PTA. Pt is ESRD pt. ? ?Heparin level above goal at 0.78 on 1250 units/hr. No bleeding issues noted.  ? ?Goal  of Therapy:  ?Heparin level 0.3-0.7 units/ml ?Monitor platelets by anticoagulation protocol: Yes ?  ?Plan:  ?Decrease heparin to 1100 units/hr ?Recheck level in am ? ?Erin Hearing PharmD., BCPS ?Clinical Pharmacist ?03/05/2022 9:17 PM ? ?

## 2022-03-05 NOTE — Assessment & Plan Note (Signed)
-   Continue statin ?-Continue to monitor ?

## 2022-03-05 NOTE — H&P (Signed)
?History and Physical  ? ? ?Patient: [name] ERD:[id number] DOB: [birth year] ?DOA: 03/04/2022 ?DOS: the patient was seen and examined on 03/05/2022 ?PCP: [name], MD  ?Patient coming from: Home ? ?Chief Complaint:  ?Chief Complaint  ?Patient presents with  ? Chest Pain  ? ?HPI: [name] is a 71 y.o. female with medical history significant of with history of chronic diastolic heart failure, anxiety, coronary artery disease, ESRD on HD Tuesday, Thursday, Saturday, COPD, diabetic neuropathy, type 2 diabetes mellitus, GERD, hyperlipidemia, hypertension, peripheral vascular disease, stroke, and more presents to ED with a chief complaint of chest tightness.  Patient reports that she was laying down, resting after dialysis, when she started to have chest tightness.  Started around 6 PM.  They got tighter and tighter like it was going to close in-progressively worse since it started.  She had the same thing happened to her on Tuesday after dialysis.  She has associated shortness of breath.  Putting oxygen on in the ER relieves that symptom.  She was not hypoxic.  She does feel palpitations, and that has been going on and off for 1 month.  She had seen cardiology and was post to put on a Holter monitor but has not done so yet.  She reports diaphoresis and nausea during these episodes.  She denies any dizziness.  She denies feeling near syncopal.  Her symptoms are worse if she tries to exert herself during these episodes, which she avoids.  She has no other complaints at this time. ? ?Patient does not smoke, does not drink alcohol, does not use illicit drugs.  She is vaccinated for COVID.  Patient is full code. ?Review of Systems: As mentioned in the history of present illness. All other systems reviewed and are negative. ?Past Medical History:  ?Diagnosis Date  ? Acute on chronic diastolic CHF (congestive heart failure) ([place]) 03/05/2015  ? Grade 2. EF 60-65%  ? Anemia   ? hx of  ? Anxiety   ? Arthritis   ? CAD  (coronary artery disease)   ? s/p Stenting in 2005;  [place] 7/09: Vigorous LV function, 2-3+ MR, RI 40%, RI stent patent, proximal-mid RCA 40-50%;  Echo 7/09:  EF 55-65%, trivial MR;  Myoview 11/18/12: EF 66%, normal LV wall motion, mild to moderate anterior ischemia - reviewed by [facility] and felt to rep breast atten (low risk);  Echo 6/14: mild LVH, EF 55-65%, Gr 2 DD, PASP 44, trivial eff   ? Chronic kidney disease   ? CREATININE IS UP--GOING TO KIDNEY MD   ? Chronic neck pain   ? COPD (chronic obstructive pulmonary disease) ([place])   ? Degenerative joint disease   ? left shoulder  ? Diabetic neuropathy ([place])   ? Diverticulosis   ? Esophageal stricture   ? GERD (gastroesophageal reflux disease)   ? Hyperlipidemia   ? Hypertension   ? IDDM (insulin dependent diabetes mellitus)   ? Pericardial effusion 03/06/2015  ? Very small per echo ; pleural nodules seen on CT chest.  ? Peripheral vascular disease ([place])   ? Stroke [facility])   ? "mini- years ago"  ? Vitamin D deficiency   ? ?Past Surgical History:  ?Procedure Laterality Date  ? A/V FISTULAGRAM N/A 08/10/2017  ? Procedure: A/V Fistulagram - Right Arm;  Surgeon: Elam Dutch, MD;  Location: [place] [facility];  Service: Cardiovascular;  Laterality: N/A;  ? A/V FISTULAGRAM N/A 03/03/2020  ? Procedure: A/V FISTULAGRAM - Right Arm;  Surgeon: Marty Heck, MD;  Location: [place] [facility];  Service: Cardiovascular;  Laterality: N/A;  ? ABDOMINAL HYSTERECTOMY    ? AV FISTULA PLACEMENT Right 07/20/2015  ? Procedure: RADIAL CEPHALIC ARTERIOVENOUS FISTULA CREATION RIGHT ARM;  Surgeon: Angelia Mould, MD;  Location: [place];  Service: Vascular;  Laterality: Right;  ? AV FISTULA PLACEMENT Right 11/26/2015  ? Procedure:  Creation of RIGHT BRACHIO-CEPHALIC arteriovenous fistula;  Surgeon: Angelia Mould, MD;  Location: [place];  Service: Vascular;  Laterality: Right;  ? BACK SURGERY    ? 2007 & 2013  ? BLADDER SURGERY    ? Tack  ? CARDIAC CATHETERIZATION  2003, 2005, 2009   ? 1 stent  ? cardiac stents    ? CATARACT EXTRACTION W/PHACO Right 05/04/2014  ? Procedure: CATARACT EXTRACTION PHACO AND INTRAOCULAR LENS PLACEMENT (IOC);  Surgeon: Williams Che, MD;  [facility];  Service: Ophthalmology;  Laterality: Right;  CDE 1.47  ? CATARACT EXTRACTION W/PHACO Left 07/20/2014  ? Procedure: CATARACT EXTRACTION WITH PHACO AND INTRAOCULAR LENS PLACEMENT LEFT EYE CDE=1.79;  Surgeon: Williams Che, MD;  [facility];  Service: Ophthalmology;  Laterality: Left;  ? CERVICAL SPINE SURGERY  2012  ? 8 screws  ? CYSTOSCOPY WITH INJECTION N/A 06/03/2013  ? Procedure: MACROPLASTIQUE  INJECTION ;  Surgeon: Malka So, MD;  [facility];  Service: Urology;  Laterality: N/A;  ? FISTULOGRAM Right 10/20/2016  ? Procedure: FISTULOGRAM WITH POSSIBLE INTERVENTION;  Surgeon: [name], MD;  [facility];  Service: Vascular;  Laterality: Right;  ? INSERTION OF DIALYSIS CATHETER N/A 11/26/2015  ? Procedure: INSERTION OF DIALYSIS CATHETER;  Surgeon: Angelia Mould, MD;  Location: [place];  Service: Vascular;  Laterality: N/A;  ? LAMINECTOMY  12/29/2011  ? LUMBAR LAMINECTOMY/DECOMPRESSION MICRODISCECTOMY  12/29/2011  ? Procedure: LUMBAR LAMINECTOMY/DECOMPRESSION MICRODISCECTOMY;  Surgeon: Floyce Stakes, MD;  Location: [place] [facility];  Service: Neurosurgery;  Laterality: N/A;  Lumbar Two through Lumbar Five Laminectomies/Cellsaver  ? PARTIAL HYSTERECTOMY    ? PERIPHERAL VASCULAR BALLOON ANGIOPLASTY  03/03/2020  ? Procedure: PERIPHERAL VASCULAR BALLOON ANGIOPLASTY;  Surgeon: Marty Heck, MD;  Location: [place] [facility];  Service: Cardiovascular;;  Rt arm fistula  ? PERIPHERAL VASCULAR BALLOON ANGIOPLASTY Right 04/15/2021  ? Procedure: PERIPHERAL VASCULAR BALLOON ANGIOPLASTY;  Surgeon: Angelia Mould, MD;  Location: [place] [facility];  Service: Cardiovascular;  Laterality: Right;  arm fistula  ? PERIPHERAL VASCULAR CATHETERIZATION N/A 11/22/2015  ? Procedure: Fistulagram;   Surgeon: Angelia Mould, MD;  Location: [place] [facility];  Service: Cardiovascular;  Laterality: N/A;  ? PERIPHERAL VASCULAR INTERVENTION  08/10/2017  ? Procedure: PERIPHERAL VASCULAR INTERVENTION;  Surgeon: Elam Dutch, MD;  Location: [place] [facility];  Service: Cardiovascular;;  ? SHOULDER SURGERY Right   ? Rotator cuff  ? ?Social History:  reports that she quit smoking about 20 years ago. Her smoking use included cigarettes. She has a 30.00 pack-year smoking history. She has never used smokeless tobacco. She reports that she does not drink alcohol and does not use drugs. ? ?Allergies  ?Allergen Reactions  ? Azithromycin   ?  Prolonged QT  ? Ace Inhibitors Cough  ? Clopidogrel Bisulfate Other (See Comments)  ?  Sick, Headache, "Felt terrible"  ? Codeine Other (See Comments)  ?  hallucinations  ? Lisinopril Cough  ? Penicillins Other (See Comments)  ?  Unknown reaction ?Did it involve swelling of the face/tongue/throat, SOB, or  low BP? Unknown ?Did it involve sudden or severe rash/hives, skin peeling, or any reaction on the inside of your mouth or nose? Unknown ?Did you need to seek medical attention at a hospital or doctor's office? Unknown ?When did it last happen?      childhood allergy ?If all above answers are "NO", may proceed with cephalosporin use. ? ?  ? ? ?Family History  ?Problem Relation Age of Onset  ? Heart disease Other   ? Hypertension Father   ? Cancer Father   ? Hypertension Mother   ? Hypertension Daughter   ? Pancreatitis Brother   ? Hypertension Daughter   ? Diabetes Daughter   ? ? ?Prior to Admission medications   ?Medication Sig Start Date End Date Taking? Authorizing Provider  ?acetaminophen (TYLENOL) 500 MG tablet Take 1,000 mg by mouth every 6 (six) hours as needed for moderate pain or headache.    [provider]  ?albuterol (PROVENTIL) (2.5 MG/3ML) 0.083% nebulizer solution Take 3 mLs (2.5 mg total) by nebulization every 6 (six) hours as needed for wheezing or  shortness of breath. 12/02/21   [name], MD  ?albuterol (VENTOLIN HFA) 108 (90 Base) MCG/ACT inhaler Inhale 2 puffs into the lungs every 6 (six) hours as needed for wheezing or shortness of brea

## 2022-03-05 NOTE — Assessment & Plan Note (Addendum)
resolved 

## 2022-03-06 ENCOUNTER — Encounter (HOSPITAL_COMMUNITY): Payer: Self-pay | Admitting: Internal Medicine

## 2022-03-06 ENCOUNTER — Encounter: Payer: Self-pay | Admitting: *Deleted

## 2022-03-06 ENCOUNTER — Inpatient Hospital Stay (HOSPITAL_COMMUNITY)
Admission: EM | Disposition: A | Discharge status: Home Health | Payer: Self-pay | Source: Home / Self Care | Attending: Internal Medicine

## 2022-03-06 ENCOUNTER — Other Ambulatory Visit (HOSPITAL_COMMUNITY): Payer: Self-pay

## 2022-03-06 DIAGNOSIS — E1122 Type 2 diabetes mellitus with diabetic chronic kidney disease: Secondary | ICD-10-CM | POA: Diagnosis not present

## 2022-03-06 DIAGNOSIS — J449 Chronic obstructive pulmonary disease, unspecified: Secondary | ICD-10-CM

## 2022-03-06 DIAGNOSIS — I25110 Atherosclerotic heart disease of native coronary artery with unstable angina pectoris: Secondary | ICD-10-CM

## 2022-03-06 DIAGNOSIS — R778 Other specified abnormalities of plasma proteins: Secondary | ICD-10-CM | POA: Diagnosis not present

## 2022-03-06 DIAGNOSIS — I5032 Chronic diastolic (congestive) heart failure: Secondary | ICD-10-CM

## 2022-03-06 DIAGNOSIS — I2510 Atherosclerotic heart disease of native coronary artery without angina pectoris: Secondary | ICD-10-CM | POA: Diagnosis not present

## 2022-03-06 DIAGNOSIS — I272 Pulmonary hypertension, unspecified: Secondary | ICD-10-CM

## 2022-03-06 DIAGNOSIS — N186 End stage renal disease: Secondary | ICD-10-CM

## 2022-03-06 DIAGNOSIS — Z95820 Peripheral vascular angioplasty status with implants and grafts: Secondary | ICD-10-CM

## 2022-03-06 DIAGNOSIS — I214 Non-ST elevation (NSTEMI) myocardial infarction: Secondary | ICD-10-CM

## 2022-03-06 DIAGNOSIS — E1169 Type 2 diabetes mellitus with other specified complication: Secondary | ICD-10-CM | POA: Diagnosis not present

## 2022-03-06 DIAGNOSIS — Z992 Dependence on renal dialysis: Secondary | ICD-10-CM

## 2022-03-06 DIAGNOSIS — I4891 Unspecified atrial fibrillation: Secondary | ICD-10-CM | POA: Diagnosis not present

## 2022-03-06 HISTORY — PX: LEFT HEART CATH AND CORONARY ANGIOGRAPHY: CATH118249

## 2022-03-06 LAB — CBC
HCT: 32.5 % — ABNORMAL LOW (ref 36.0–46.0)
Hemoglobin: 10.9 g/dL — ABNORMAL LOW (ref 12.0–15.0)
MCH: 28.3 pg (ref 26.0–34.0)
MCHC: 33.5 g/dL (ref 30.0–36.0)
MCV: 84.4 fL (ref 80.0–100.0)
Platelets: 218 10*3/uL (ref 150–400)
RBC: 3.85 MIL/uL — ABNORMAL LOW (ref 3.87–5.11)
RDW: 17.3 % — ABNORMAL HIGH (ref 11.5–15.5)
WBC: 13.6 10*3/uL — ABNORMAL HIGH (ref 4.0–10.5)
nRBC: 0 % (ref 0.0–0.2)

## 2022-03-06 LAB — BASIC METABOLIC PANEL
Anion gap: 15 (ref 5–15)
BUN: 60 mg/dL — ABNORMAL HIGH (ref 8–23)
CO2: 24 mmol/L (ref 22–32)
Calcium: 9.2 mg/dL (ref 8.9–10.3)
Chloride: 93 mmol/L — ABNORMAL LOW (ref 98–111)
Creatinine, Ser: 9.17 mg/dL — ABNORMAL HIGH (ref 0.44–1.00)
GFR, Estimated: 4 mL/min — ABNORMAL LOW (ref >60)
Glucose, Bld: 135 mg/dL — ABNORMAL HIGH (ref 70–99)
Potassium: 3.7 mmol/L (ref 3.5–5.1)
Sodium: 132 mmol/L — ABNORMAL LOW (ref 135–145)

## 2022-03-06 LAB — GLUCOSE, CAPILLARY
Glucose-Capillary: 129 mg/dL — ABNORMAL HIGH (ref 70–99)
Glucose-Capillary: 83 mg/dL (ref 70–99)
Glucose-Capillary: 64 mg/dL — ABNORMAL LOW (ref 70–99)
Glucose-Capillary: 133 mg/dL — ABNORMAL HIGH (ref 70–99)
Glucose-Capillary: 104 mg/dL — ABNORMAL HIGH (ref 70–99)
Glucose-Capillary: 82 mg/dL (ref 70–99)
Glucose-Capillary: 165 mg/dL — ABNORMAL HIGH (ref 70–99)

## 2022-03-06 LAB — LIPID PANEL
Cholesterol: 127 mg/dL (ref 0–200)
HDL: 44 mg/dL (ref >40)
LDL Cholesterol: 64 mg/dL (ref 0–99)
Total CHOL/HDL Ratio: 2.9 RATIO
Triglycerides: 95 mg/dL (ref <150)
VLDL: 19 mg/dL (ref 0–40)

## 2022-03-06 LAB — POCT ACTIVATED CLOTTING TIME: Activated Clotting Time: 131 seconds

## 2022-03-06 LAB — HEPARIN LEVEL (UNFRACTIONATED): Heparin Unfractionated: 0.6 IU/mL (ref 0.30–0.70)

## 2022-03-06 SURGERY — LEFT HEART CATH AND CORONARY ANGIOGRAPHY
Anesthesia: LOCAL

## 2022-03-06 MED ORDER — HEPARIN (PORCINE) IN NACL 1000-0.9 UT/500ML-% IV SOLN
INTRAVENOUS | Status: DC | PRN
  Administered 2022-03-06 (×2): 500 mL

## 2022-03-06 MED ORDER — SODIUM CHLORIDE 0.9 % IV SOLN: 250.0000 mL | INTRAVENOUS | Status: DC | PRN

## 2022-03-06 MED ORDER — LIDOCAINE HCL (PF) 1 % IJ SOLN
INTRAMUSCULAR | Status: DC | PRN
  Administered 2022-03-06: 12 mL

## 2022-03-06 MED ORDER — SODIUM CHLORIDE 0.9% FLUSH: 3.0000 mL | INTRAVENOUS | Status: DC | PRN

## 2022-03-06 MED ORDER — HEPARIN (PORCINE) 25000 UT/250ML-% IV SOLN
1100.0000 [IU]/h | INTRAVENOUS | Status: DC
  Administered 2022-03-06 – 2022-03-07 (×2): 1100 [IU]/h via INTRAVENOUS
  Filled 2022-03-06 (×2): qty 250

## 2022-03-06 MED ORDER — DEXTROSE 50 % IV SOLN
INTRAVENOUS | Status: AC
  Administered 2022-03-06: 50 mL
  Filled 2022-03-06: qty 50

## 2022-03-06 MED ORDER — NITROGLYCERIN IN D5W 200-5 MCG/ML-% IV SOLN: 0.0000 ug/min | INTRAVENOUS | Status: DC

## 2022-03-06 MED ORDER — MIDAZOLAM HCL 2 MG/2ML IJ SOLN
INTRAMUSCULAR | Status: AC
  Filled 2022-03-06: qty 2

## 2022-03-06 MED ORDER — HEPARIN (PORCINE) IN NACL 1000-0.9 UT/500ML-% IV SOLN
INTRAVENOUS | Status: AC
  Filled 2022-03-06: qty 500

## 2022-03-06 MED ORDER — INSULIN ASPART 100 UNIT/ML IJ SOLN
0.0000 [IU] | Freq: Three times a day (TID) | INTRAMUSCULAR | Status: DC
  Administered 2022-03-07: 2 [IU] via SUBCUTANEOUS
  Administered 2022-03-08: 1 [IU] via SUBCUTANEOUS
  Administered 2022-03-09: 2 [IU] via SUBCUTANEOUS
  Administered 2022-03-10: 1 [IU] via SUBCUTANEOUS
  Administered 2022-03-10: 2 [IU] via SUBCUTANEOUS
  Administered 2022-03-11 – 2022-03-12 (×4): 1 [IU] via SUBCUTANEOUS
  Administered 2022-03-12: 3 [IU] via SUBCUTANEOUS
  Administered 2022-03-13 (×2): 2 [IU] via SUBCUTANEOUS

## 2022-03-06 MED ORDER — INSULIN ASPART 100 UNIT/ML IJ SOLN
0.0000 [IU] | Freq: Every day | INTRAMUSCULAR | Status: DC
  Administered 2022-03-07 – 2022-03-12 (×4): 2 [IU] via SUBCUTANEOUS
  Administered 2022-03-14: 3 [IU] via SUBCUTANEOUS

## 2022-03-06 MED ORDER — IOHEXOL 350 MG/ML SOLN
INTRAVENOUS | Status: DC | PRN
  Administered 2022-03-06: 70 mL

## 2022-03-06 MED ORDER — LIDOCAINE HCL (PF) 1 % IJ SOLN
INTRAMUSCULAR | Status: AC
  Filled 2022-03-06: qty 30

## 2022-03-06 MED ORDER — SODIUM CHLORIDE 0.9% FLUSH
3.0000 mL | Freq: Two times a day (BID) | INTRAVENOUS | Status: DC
  Administered 2022-03-06 – 2022-03-08 (×2): 3 mL via INTRAVENOUS

## 2022-03-06 MED ORDER — FENTANYL CITRATE (PF) 100 MCG/2ML IJ SOLN
INTRAMUSCULAR | Status: AC
  Filled 2022-03-06: qty 2

## 2022-03-06 MED ORDER — HYDRALAZINE HCL 20 MG/ML IJ SOLN: 10.0000 mg | INTRAMUSCULAR | Status: AC | PRN

## 2022-03-06 MED ORDER — FUROSEMIDE 10 MG/ML IJ SOLN
80.0000 mg | Freq: Two times a day (BID) | INTRAMUSCULAR | Status: DC
  Administered 2022-03-06 – 2022-03-08 (×3): 80 mg via INTRAVENOUS
  Filled 2022-03-06 (×4): qty 8

## 2022-03-06 MED ORDER — LABETALOL HCL 5 MG/ML IV SOLN: 10.0000 mg | INTRAVENOUS | Status: AC | PRN

## 2022-03-06 MED ORDER — NITROGLYCERIN IN D5W 200-5 MCG/ML-% IV SOLN
INTRAVENOUS | Status: AC | PRN
  Administered 2022-03-06: 10 ug/min via INTRAVENOUS

## 2022-03-06 SURGICAL SUPPLY — 8 items
CATH 5FR JL3.5 JR4 ANG PIG MP (CATHETERS) ×1 IMPLANT
KIT HEART LEFT (KITS) ×2 IMPLANT
KIT MICROPUNCTURE NIT STIFF (SHEATH) ×1 IMPLANT
PACK CARDIAC CATHETERIZATION (CUSTOM PROCEDURE TRAY) ×2 IMPLANT
SHEATH PINNACLE 5F 10CM (SHEATH) ×1 IMPLANT
SHEATH PROBE COVER 6X72 (BAG) ×1 IMPLANT
TRANSDUCER W/STOPCOCK (MISCELLANEOUS) ×2 IMPLANT
WIRE EMERALD 3MM-J .035X150CM (WIRE) ×1 IMPLANT

## 2022-03-06 NOTE — TOC Benefit Eligibility Note (Signed)
Patient Advocate Encounter ? ?Insurance verification completed.   ? ?The patient is currently admitted and upon discharge could be taking Xarelto 20 mg. ? ?The current 30 day co-pay is, $123.76 due to a $43.12 deductible remaining.  ? ?The patient is currently admitted and upon discharge could be taking Eliquis 5 mg. ? ?The current 30 day co-pay is, $126.70 due to a $43.12 deductible remaining.  ? ?The patient is insured through [employer] Part D  ? ? ? ?Lyndel Safe, CPhT ?Pharmacy Patient Advocate Specialist ?[place] Patient Advocate Team ?Direct Number: [phone]  Fax: [phone] ? ? ? ? ? ?  ?

## 2022-03-06 NOTE — Progress Notes (Signed)
Site area: rt groin arterial sheath ?Site Prior to Removal:  Level 0 ?Pressure Applied For: 20 minutes ?Manual:   yes ?Patient Status During Pull:  stable ?Post Pull Site:  Level 0 ?Post Pull Instructions Given:  yes ?Post Pull Pulses Present: rt dp dopplered ?Dressing Applied:  gauze and tegaderm ?Bedrest begins @ 1320 ?Comments: ?  ?

## 2022-03-06 NOTE — Consult Note (Addendum)
? ?   ?[address] ?      [place] [address] ?            [phone]       ? ?Cooper Render Horkey ?[place] Record #[id number] ?Date of Birth: [birth year] ? ?Referring: No ref. provider found ?Primary Care: [name], MD ?Primary Cardiologist:James Hochrein, MD ? ?Chief Complaint:    ?Chief Complaint  ?Patient presents with  ? Chest Pain  ? ? ?History of Present Illness: The patient is a 71 year old female we are asked to see in cardiothoracic surgical consultation for consideration of coronary artery surgical revascularization.   The patient has a history of multiple cardiac risk factors and other significant medical comorbidities.   She has a history of chronic diastolic congestive heart failure, coronary artery disease with previous stenting in 2005, diabetes mellitus, hyperlipidemia, peripheral vascular arterial occlusive disease, and history of CVA.  Of significance to note she has end-stage renal disease on hemodialysis triweekly.  She is also noted to have COPD.  She is a former smoker with a 30-pack-year history and quit in 2003.  See below in her past medical history for full details of medical comorbidities.  She presented to the emergency department on 03/05/2022 with a complaint of chest tightness.  The patient described that when she was laying down following her dialysis she developed chest tightness that started approximately 6 PM.  Symptoms became progressively worse over time.  She had a similar type pain following her  dialysis on the previous Tuesday.  The symptoms were also associated with shortness of breath.  She presented to the emergency department and gain some relief when oxygen supplementation was added.  She was not hypoxic.  She did feel some palpitations but notes that this has been an ongoing sensation for approximately a month, intermittently.  She was scheduled for placement of a Holter monitor but it has not been done yet.  During the event she also noted  nausea and diaphoresis.  The symptoms are also worse with exertion.  In the ER she was found to be in atrial fibrillation with an RVR.  She has not been noted to have a previous history of atrial fibrillation.  Her CHA2DS2-VASc score is noted to be 6.  There was an elevation in her high-sensitivity troponins felt to be consistent with demand ischemia.  They are trending higher over time with most recent value of 181.  She was transferred from [facility] to [facility] for further evaluation and treatment.  She was seen by cardiology in consultation.  They felt cardiac catheterization was warranted and this was done on today's date.  This revealed severe two-vessel coronary artery disease with an 80 to 90% mid LAD and 99% proximal RCA stenosis.  There is left to right collateralization.  The ramus intermedius stent is patent with minimal in-stent restenosis.  She does have moderately elevated left ventricular filling pressure with an LVEDP of 30 mmHg.  She has a heavily calcified right external iliac and common femoral arteries with 50% stenosis of the right CFA.  See the full report below.  Echocardiogram was performed yesterday.  Left ventricular ejection fraction by estimation is 55%.  She does have right ventricular systolic function moderate to severely decreased.  There is severely elevated pulmonary artery systolic pressures.  There is mild mitral valve regurgitation.  See the full report listed below.  She does have a chronic anemia consistent with her renal disease and  other associated comorbidities.  Recent hemoglobin hematocrit dated 03/06/2022 are is 10.9/32.5 respectively.  She did present with a leukocytosis which is probably multifactorial. ? ? ? ?Current Activity/ Functional Status:significant overall deconditioning ? ?  ?Zubrod Score: ?At the time of surgery this patient?s most appropriate activity status/level should be described as: ?[]     0    Normal activity, no symptoms ?[]     1     Restricted in physical strenuous activity but ambulatory, able to do out light work ?[]     2    Ambulatory and capable of self care, unable to do work activities, up and about                 more than 50%  Of the time                            ?[x]     3    Only limited self care, in bed greater than 50% of waking hours ?[]     4    Completely disabled, no self care, confined to bed or chair ?[]     5    Moribund ? ?Past Medical History:  ?Diagnosis Date  ? Acute on chronic diastolic CHF (congestive heart failure) ([place]) 03/05/2015  ? Grade 2. EF 60-65%  ? Anemia   ? hx of  ? Anxiety   ? Arthritis   ? CAD (coronary artery disease)   ? s/p Stenting in 2005;  [place] 7/09: Vigorous LV function, 2-3+ MR, RI 40%, RI stent patent, proximal-mid RCA 40-50%;  Echo 7/09:  EF 55-65%, trivial MR;  Myoview 11/18/12: EF 66%, normal LV wall motion, mild to moderate anterior ischemia - reviewed by [facility] and felt to rep breast atten (low risk);  Echo 6/14: mild LVH, EF 55-65%, Gr 2 DD, PASP 44, trivial eff   ? Chronic kidney disease   ? CREATININE IS UP--GOING TO KIDNEY MD   ? Chronic neck pain   ? COPD (chronic obstructive pulmonary disease) ([place])   ? Degenerative joint disease   ? left shoulder  ? Diabetic neuropathy ([place])   ? Diverticulosis   ? Esophageal stricture   ? GERD (gastroesophageal reflux disease)   ? Hyperlipidemia   ? Hypertension   ? IDDM (insulin dependent diabetes mellitus)   ? Pericardial effusion 03/06/2015  ? Very small per echo ; pleural nodules seen on CT chest.  ? Peripheral vascular disease ([place])   ? Stroke [facility])   ? "mini- years ago"  ? Vitamin D deficiency   ? ? ?Past Surgical History:  ?Procedure Laterality Date  ? A/V FISTULAGRAM N/A 08/10/2017  ? Procedure: A/V Fistulagram - Right Arm;  Surgeon: Elam Dutch, MD;  Location: [place] [facility];  Service: Cardiovascular;  Laterality: N/A;  ? A/V FISTULAGRAM N/A 03/03/2020  ? Procedure: A/V FISTULAGRAM - Right Arm;  Surgeon: Marty Heck, MD;  Location: [place] [facility];  Service: Cardiovascular;  Laterality: N/A;  ? ABDOMINAL HYSTERECTOMY    ? AV FISTULA PLACEMENT Right 07/20/2015  ? Procedure: RADIAL CEPHALIC ARTERIOVENOUS FISTULA CREATION RIGHT ARM;  Surgeon: Angelia Mould, MD;  Location: [place];  Service: Vascular;  Laterality: Right;  ? AV FISTULA PLACEMENT Right 11/26/2015  ? Procedure:  Creation of RIGHT BRACHIO-CEPHALIC arteriovenous fistula;  Surgeon: Angelia Mould, MD;  Location: [place];  Service: Vascular;  Laterality: Right;  ? BACK SURGERY    ? 2007 &  2013  ? BLADDER SURGERY    ? Tack  ? CARDIAC CATHETERIZATION  2003, 2005, 2009  ? 1 stent  ? cardiac stents    ? CATARACT EXTRACTION W/PHACO Right 05/04/2014  ? Procedure: CATARACT EXTRACTION PHACO AND INTRAOCULAR LENS PLACEMENT (IOC);  Surgeon: Williams Che, MD;  Location: [facility];  Service: Ophthalmology;  Laterality: Right;  CDE 1.47  ? CATARACT EXTRACTION W/PHACO Left 07/20/2014  ? Procedure: CATARACT EXTRACTION WITH PHACO AND INTRAOCULAR LENS PLACEMENT LEFT EYE CDE=1.79;  Surgeon: Williams Che, MD;  Location: [facility];  Service: Ophthalmology;  Laterality: Left;  ? CERVICAL SPINE SURGERY  2012  ? 8 screws  ? CYSTOSCOPY WITH INJECTION N/A 06/03/2013  ? Procedure: MACROPLASTIQUE  INJECTION ;  Surgeon: Malka So, MD;  Location: [facility];  Service: Urology;  Laterality: N/A;  ? FISTULOGRAM Right 10/20/2016  ? Procedure: FISTULOGRAM WITH POSSIBLE INTERVENTION;  Surgeon: [name], MD;  Location: [facility];  Service: Vascular;  Laterality: Right;  ? INSERTION OF DIALYSIS CATHETER N/A 11/26/2015  ? Procedure: INSERTION OF DIALYSIS CATHETER;  Surgeon: Angelia Mould, MD;  Location: [place];  Service: Vascular;  Laterality: N/A;  ? LAMINECTOMY  12/29/2011  ? LUMBAR LAMINECTOMY/DECOMPRESSION MICRODISCECTOMY  12/29/2011  ? Procedure: LUMBAR LAMINECTOMY/DECOMPRESSION MICRODISCECTOMY;  Surgeon: Floyce Stakes, MD;  Location: [place] [facility];  Service: Neurosurgery;  Laterality: N/A;  Lumbar Two  through Lumbar Five Laminectomies/Cellsaver  ? PARTIAL HYSTERECTOMY    ? PERIPHERAL VASCULAR BALLOON ANGIOPLASTY  03/03/2020  ? Procedure: PERIPHERAL VASCULAR BALLOON ANGIOPLASTY;  Surgeon: Clint Bolder

## 2022-03-06 NOTE — Progress Notes (Signed)
ANTICOAGULATION CONSULT NOTE  ? ?Pharmacy Consult for Heparin ?Indication: atrial fibrillation ? ?Allergies  ?Allergen Reactions  ? Azithromycin   ?  Prolonged QT  ? Ace Inhibitors Cough  ? Clopidogrel Bisulfate Other (See Comments)  ?  Sick, Headache, "Felt terrible"  ? Codeine Other (See Comments)  ?  hallucinations  ? Lisinopril Cough  ? Penicillins Other (See Comments)  ?  Unknown reaction ?Did it involve swelling of the face/tongue/throat, SOB, or low BP? Unknown ?Did it involve sudden or severe rash/hives, skin peeling, or any reaction on the inside of your mouth or nose? Unknown ?Did you need to seek medical attention at a hospital or doctor's office? Unknown ?When did it last happen?      childhood allergy ?If all above answers are "NO", may proceed with cephalosporin use. ? ?  ? ? ?Patient Measurements: ?Height: 5\' 3"  (160 cm) ?Weight: 83.7 kg (184 lb 8.4 oz) ?IBW/kg (Calculated) : 52.4 ?Heparin Dosing Weight: 71 kg ? ?Vital Signs: ?Temp: 97.7 ?F (36.5 ?C) (04/03 0740) ?Temp Source: Oral (04/03 0740) ?BP: 144/69 (04/03 0422) ?Pulse Rate: 70 (04/03 0740) ? ?Labs: ?Recent Labs  ?  03/04/22 ?2041 03/04/22 ?2246 03/05/22 ?0323 03/05/22 ?0740 03/05/22 ?2004 03/06/22 ?9381  ?HGB 12.3  --   --  11.6*  --  10.9*  ?HCT 38.1  --   --  34.7*  --  32.5*  ?PLT 266  --   --  223  --  218  ?HEPARINUNFRC  --   --   --   --  0.78* 0.60  ?CREATININE 6.09*  --   --   --   --  9.17*  ?TROPONINIHS 99* 105* 181*  --   --   --   ? ? ? ?Estimated Creatinine Clearance: 5.9 mL/min (A) (by C-G formula based on SCr of 9.17 mg/dL (H)). ? ? ?Medical History: ?Past Medical History:  ?Diagnosis Date  ? Acute on chronic diastolic CHF (congestive heart failure) ([place]) 03/05/2015  ? Grade 2. EF 60-65%  ? Anemia   ? hx of  ? Anxiety   ? Arthritis   ? CAD (coronary artery disease)   ? s/p Stenting in 2005;  [place] 7/09: Vigorous LV function, 2-3+ MR, RI 40%, RI stent patent, proximal-mid RCA 40-50%;  Echo 7/09:  EF 55-65%, trivial MR;  Myoview  11/18/12: EF 66%, normal LV wall motion, mild to moderate anterior ischemia - reviewed by [facility] and felt to rep breast atten (low risk);  Echo 6/14: mild LVH, EF 55-65%, Gr 2 DD, PASP 44, trivial eff   ? Chronic kidney disease   ? CREATININE IS UP--GOING TO KIDNEY MD   ? Chronic neck pain   ? COPD (chronic obstructive pulmonary disease) ([place])   ? Degenerative joint disease   ? left shoulder  ? Diabetic neuropathy ([place])   ? Diverticulosis   ? Esophageal stricture   ? GERD (gastroesophageal reflux disease)   ? Hyperlipidemia   ? Hypertension   ? IDDM (insulin dependent diabetes mellitus)   ? Pericardial effusion 03/06/2015  ? Very small per echo ; pleural nodules seen on CT chest.  ? Peripheral vascular disease ([place])   ? Stroke [facility])   ? "mini- years ago"  ? Vitamin D deficiency   ? ? ? ?Assessment: ?71 y.o. F presents with CP.  Found to be in afib with RVR. CHADS2VASC score is 6. CBC stable. No AC PTA. Pt is ESRD pt. ? ?Heparin level within goal range this  AM. No bleeding issues noted.  ? ?Goal of Therapy:  ?Heparin level 0.3-0.7 units/ml ?Monitor platelets by anticoagulation protocol: Yes ?  ?Plan:  ?Continue IV heparin at 1100 units/hr ?Daily heparin level and CBC. ?F/u plans for cath. ? ?[place] Crane, Pharm D, BCPS, BCCP ?Clinical Pharmacist ? 03/06/2022 8:02 AM  ? ?[facility] pharmacy phone numbers are listed on [email or web address] ? ? ?

## 2022-03-06 NOTE — H&P (View-Only) (Signed)
? ?Progress Note ? ?Patient Name: [name] ?Date of Encounter: 03/06/2022 ? ?Primary Cardiologist:   Minus Breeding, MD ? ? ?Subjective  ? ?She still seems to have some mild discomfort in the chest today.  "Not like it was."  No palpitations.  ? ?Inpatient Medications  ?  ?Scheduled Meds: ? aspirin EC  81 mg Oral Daily  ? atenolol  50 mg Oral BID  ? busPIRone  15 mg Oral BID  ? calcium acetate  1,334 mg Oral TID with meals  ? diltiazem  30 mg Oral Q6H  ? famotidine  20 mg Oral BID  ? gabapentin  400 mg Oral q1800  ? insulin aspart  0-15 Units Subcutaneous TID WC  ? insulin aspart  0-5 Units Subcutaneous QHS  ? insulin detemir  15 Units Subcutaneous QHS  ? isosorbide mononitrate  30 mg Oral Daily  ? pantoprazole  40 mg Oral Daily  ? QUEtiapine  100 mg Oral QHS  ? rOPINIRole  1 mg Oral QHS  ? rosuvastatin  5 mg Oral Daily  ? sodium chloride flush  3 mL Intravenous Q12H  ? torsemide  20 mg Oral Daily  ? ?Continuous Infusions: ? sodium chloride    ? sodium chloride 10 mL/hr at 03/06/22 0629  ? diltiazem (CARDIZEM) infusion Stopped (03/05/22 1851)  ? heparin 1,100 Units/hr (03/05/22 2133)  ? ?PRN Meds: ?sodium chloride, acetaminophen **OR** acetaminophen, calcium acetate, hydrOXYzine, levalbuterol, morphine injection, nitroGLYCERIN, oxyCODONE, promethazine, sodium chloride flush  ? ?Vital Signs  ?  ?Vitals:  ? 03/05/22 1945 03/05/22 2252 03/06/22 0422 03/06/22 0740  ?BP: (!) 164/69 (!) 154/65 (!) 144/69   ?Pulse: 63 (!) 58  70  ?Resp: 20 20 10    ?Temp: 97.8 ?F (36.6 ?C) (!) 97.4 ?F (36.3 ?C) 97.6 ?F (36.4 ?C) 97.7 ?F (36.5 ?C)  ?TempSrc: Oral Oral Oral Oral  ?SpO2: 95% 95% 98% 98%  ?Weight:      ?Height:      ? ? ?Intake/Output Summary (Last 24 hours) at 03/06/2022 0807 ?Last data filed at 03/05/2022 1524 ?Gross per 24 hour  ?Intake 190.13 ml  ?Output --  ?Net 190.13 ml  ? ?Filed Weights  ? 03/04/22 2358 03/05/22 0704  ?Weight: 85.3 kg 83.7 kg  ? ? ?Telemetry  ?  ?Atrial fib with controlled ventricular rate -  Personally Reviewed ? ?ECG  ?  ?NA - Personally Reviewed ? ?Physical Exam  ? ?GEN: No acute distress.   ?Neck: No  JVD ?Cardiac: Irregular RR, no murmurs, rubs, or gallops.  ?Respiratory: Clear to auscultation bilaterally. ?GI: Soft, nontender, non-distended  ?MS: No  edema; No deformity. ?Neuro:  Nonfocal  ?Psych: Normal affect  ? ?Labs  ?  ?Chemistry ?Recent Labs  ?Lab 02/28/22 ?2351 03/04/22 ?2041 03/06/22 ?2774  ?NA 135 133* 132*  ?K 3.4* 3.3* 3.7  ?CL 93* 92* 93*  ?CO2 28 27 24   ?GLUCOSE 190* 274* 135*  ?BUN 35* 34* 60*  ?CREATININE 7.85* 6.09* 9.17*  ?CALCIUM 8.9 8.7* 9.2  ?GFRNONAA 5* 7* 4*  ?ANIONGAP 14 14 15   ?  ? ?Hematology ?Recent Labs  ?Lab 03/04/22 ?2041 03/05/22 ?0740 03/06/22 ?1287  ?WBC 15.0* 13.7* 13.6*  ?RBC 4.41 4.12 3.85*  ?HGB 12.3 11.6* 10.9*  ?HCT 38.1 34.7* 32.5*  ?MCV 86.4 84.2 84.4  ?MCH 27.9 28.2 28.3  ?MCHC 32.3 33.4 33.5  ?RDW 17.4* 17.1* 17.3*  ?PLT 266 223 218  ? ? ?Cardiac EnzymesNo results for input(s): TROPONINI in the last 168 hours.  No results for input(s): TROPIPOC in the last 168 hours.  ? ?BNP ?Recent Labs  ?Lab 02/28/22 ?2351  ?BNP 1,695.0*  ?  ? ?DDimer No results for input(s): DDIMER in the last 168 hours.  ? ?Radiology  ?  ?DG Chest 2 View ? ?Result Date: 03/04/2022 ?CLINICAL DATA:  Chest pain and shortness of breath EXAM: CHEST - 2 VIEW COMPARISON:  02/28/2022 FINDINGS: Cardiac enlargement. No vascular congestion, edema, or consolidation is suggested. No pleural effusions. No pneumothorax. Mediastinal contours appear intact. Calcification of the aorta. Postoperative changes in the cervical spine. IMPRESSION: Cardiac enlargement.  No evidence of active pulmonary disease. Electronically Signed   By: Lucienne Capers M.D.   On: 03/04/2022 20:42  ? ?ECHOCARDIOGRAM COMPLETE ? ?Result Date: 03/05/2022 ?   ECHOCARDIOGRAM REPORT   Patient Name:   [name] Date of Exam: 03/05/2022 Medical Rec #:  [id number]       Height:       63.0 in Accession #:    [id number]      Weight:        184.5 lb Date of Birth:  08-Jul-1951       BSA:          1.869 m? Patient Age:    71 years        BP:           151/57 mmHg Patient Gender: F               HR:           70 bpm. Exam Location:  Inpatient Procedure: 2D Echo, Cardiac Doppler and Color Doppler Indications:    Atrial fibrillation  History:        Patient has prior history of Echocardiogram examinations, most                 recent 12/06/2021. CHF, CAD, COPD, Signs/Symptoms:Shortness of                 Breath; Risk Factors:Hypertension, Diabetes and Dyslipidemia. Hx                 stroke. ESRD.  Sonographer:    Clayton Lefort RDCS (AE) Referring Phys: [id number] ASIA B [place]  Sonographer Comments: Patient is morbidly obese. Image acquisition challenging due to patient body habitus. IMPRESSIONS  1. Left ventricular ejection fraction, by estimation, is 55%%. The left ventricle has low normal function. The left ventricle has no regional wall motion abnormalities. There is mild left ventricular hypertrophy. Left ventricular diastolic parameters are indeterminate.  2. Compared to prior study, RV dysfunction is new     . Right ventricular systolic function moderate to severely decreased. The right ventricular size is mildly enlarged. There is severely elevated pulmonary artery systolic pressure.  3. Left atrial size was mildly dilated.  4. Right atrial size was mildly dilated.  5. A small pericardial effusion is present.  6. Mild mitral valve regurgitation.  7. The aortic valve is tricuspid. Aortic valve regurgitation is not visualized. Aortic valve sclerosis is present, with no evidence of aortic valve stenosis.  8. The inferior vena cava is dilated in size with <50% respiratory variability, suggesting right atrial pressure of 15 mmHg. FINDINGS  Left Ventricle: Left ventricular ejection fraction, by estimation, is 55%%. The left ventricle has low normal function. The left ventricle has no regional wall motion abnormalities. The left ventricular internal cavity  size was normal in size. There is mild left ventricular hypertrophy. Left ventricular diastolic parameters  are indeterminate. Right Ventricle: Compared to prior study, RV dysfunction is new. The right ventricular size is mildly enlarged. Right vetricular wall thickness was not assessed. Right ventricular systolic function moderate to severely decreased. There is severely elevated pulmonary artery systolic pressure. The tricuspid regurgitant velocity is 3.42 m/s, and with an assumed right atrial pressure of 15 mmHg, the estimated right ventricular systolic pressure is 41.6 mmHg. Left Atrium: Left atrial size was mildly dilated. Right Atrium: Right atrial size was mildly dilated. Pericardium: A small pericardial effusion is present. Mitral Valve: There is mild thickening of the mitral valve leaflet(s). Mild mitral valve regurgitation. MV peak gradient, 9.6 mmHg. The mean mitral valve gradient is 3.0 mmHg. Tricuspid Valve: The tricuspid valve is normal in structure. Tricuspid valve regurgitation is mild. Aortic Valve: The aortic valve is tricuspid. Aortic valve regurgitation is not visualized. Aortic valve sclerosis is present, with no evidence of aortic valve stenosis. Aortic valve mean gradient measures 3.0 mmHg. Aortic valve peak gradient measures 5.3  mmHg. Aortic valve area, by VTI measures 1.93 cm?. Pulmonic Valve: The pulmonic valve was not well visualized. Pulmonic valve regurgitation is not visualized. Aorta: The aortic root and ascending aorta are structurally normal, with no evidence of dilitation. Venous: The inferior vena cava is dilated in size with less than 50% respiratory variability, suggesting right atrial pressure of 15 mmHg. IAS/Shunts: No atrial level shunt detected by color flow Doppler.  LEFT VENTRICLE PLAX 2D LVIDd:         4.60 cm LVIDs:         3.30 cm LV PW:         1.50 cm LV IVS:        1.40 cm LVOT diam:     1.90 cm LV SV:         48 LV SV Index:   26 LVOT Area:     2.84 cm?  RIGHT  VENTRICLE            IVC RV Basal diam:  3.40 cm    IVC diam: 2.50 cm RV S prime:     6.53 cm/s TAPSE (M-mode): 0.8 cm LEFT ATRIUM             Index        RIGHT ATRIUM           Index LA diam:        4.20 cm 2.25 cm

## 2022-03-06 NOTE — Progress Notes (Signed)
? ?Progress Note ? ?Patient Name: [name] ?Date of Encounter: 03/06/2022 ? ?Primary Cardiologist:   Minus Breeding, MD ? ? ?Subjective  ? ?She still seems to have some mild discomfort in the chest today.  "Not like it was."  No palpitations.  ? ?Inpatient Medications  ?  ?Scheduled Meds: ? aspirin EC  81 mg Oral Daily  ? atenolol  50 mg Oral BID  ? busPIRone  15 mg Oral BID  ? calcium acetate  1,334 mg Oral TID with meals  ? diltiazem  30 mg Oral Q6H  ? famotidine  20 mg Oral BID  ? gabapentin  400 mg Oral q1800  ? insulin aspart  0-15 Units Subcutaneous TID WC  ? insulin aspart  0-5 Units Subcutaneous QHS  ? insulin detemir  15 Units Subcutaneous QHS  ? isosorbide mononitrate  30 mg Oral Daily  ? pantoprazole  40 mg Oral Daily  ? QUEtiapine  100 mg Oral QHS  ? rOPINIRole  1 mg Oral QHS  ? rosuvastatin  5 mg Oral Daily  ? sodium chloride flush  3 mL Intravenous Q12H  ? torsemide  20 mg Oral Daily  ? ?Continuous Infusions: ? sodium chloride    ? sodium chloride 10 mL/hr at 03/06/22 0629  ? diltiazem (CARDIZEM) infusion Stopped (03/05/22 1851)  ? heparin 1,100 Units/hr (03/05/22 2133)  ? ?PRN Meds: ?sodium chloride, acetaminophen **OR** acetaminophen, calcium acetate, hydrOXYzine, levalbuterol, morphine injection, nitroGLYCERIN, oxyCODONE, promethazine, sodium chloride flush  ? ?Vital Signs  ?  ?Vitals:  ? 03/05/22 1945 03/05/22 2252 03/06/22 0422 03/06/22 0740  ?BP: (!) 164/69 (!) 154/65 (!) 144/69   ?Pulse: 63 (!) 58  70  ?Resp: 20 20 10    ?Temp: 97.8 ?F (36.6 ?C) (!) 97.4 ?F (36.3 ?C) 97.6 ?F (36.4 ?C) 97.7 ?F (36.5 ?C)  ?TempSrc: Oral Oral Oral Oral  ?SpO2: 95% 95% 98% 98%  ?Weight:      ?Height:      ? ? ?Intake/Output Summary (Last 24 hours) at 03/06/2022 0807 ?Last data filed at 03/05/2022 1524 ?Gross per 24 hour  ?Intake 190.13 ml  ?Output --  ?Net 190.13 ml  ? ?Filed Weights  ? 03/04/22 2358 03/05/22 0704  ?Weight: 85.3 kg 83.7 kg  ? ? ?Telemetry  ?  ?Atrial fib with controlled ventricular rate -  Personally Reviewed ? ?ECG  ?  ?NA - Personally Reviewed ? ?Physical Exam  ? ?GEN: No acute distress.   ?Neck: No  JVD ?Cardiac: Irregular RR, no murmurs, rubs, or gallops.  ?Respiratory: Clear to auscultation bilaterally. ?GI: Soft, nontender, non-distended  ?MS: No  edema; No deformity. ?Neuro:  Nonfocal  ?Psych: Normal affect  ? ?Labs  ?  ?Chemistry ?Recent Labs  ?Lab 02/28/22 ?2351 03/04/22 ?2041 03/06/22 ?2355  ?NA 135 133* 132*  ?K 3.4* 3.3* 3.7  ?CL 93* 92* 93*  ?CO2 28 27 24   ?GLUCOSE 190* 274* 135*  ?BUN 35* 34* 60*  ?CREATININE 7.85* 6.09* 9.17*  ?CALCIUM 8.9 8.7* 9.2  ?GFRNONAA 5* 7* 4*  ?ANIONGAP 14 14 15   ?  ? ?Hematology ?Recent Labs  ?Lab 03/04/22 ?2041 03/05/22 ?0740 03/06/22 ?7322  ?WBC 15.0* 13.7* 13.6*  ?RBC 4.41 4.12 3.85*  ?HGB 12.3 11.6* 10.9*  ?HCT 38.1 34.7* 32.5*  ?MCV 86.4 84.2 84.4  ?MCH 27.9 28.2 28.3  ?MCHC 32.3 33.4 33.5  ?RDW 17.4* 17.1* 17.3*  ?PLT 266 223 218  ? ? ?Cardiac EnzymesNo results for input(s): TROPONINI in the last 168 hours.  No results for input(s): TROPIPOC in the last 168 hours.  ? ?BNP ?Recent Labs  ?Lab 02/28/22 ?2351  ?BNP 1,695.0*  ?  ? ?DDimer No results for input(s): DDIMER in the last 168 hours.  ? ?Radiology  ?  ?DG Chest 2 View ? ?Result Date: 03/04/2022 ?CLINICAL DATA:  Chest pain and shortness of breath EXAM: CHEST - 2 VIEW COMPARISON:  02/28/2022 FINDINGS: Cardiac enlargement. No vascular congestion, edema, or consolidation is suggested. No pleural effusions. No pneumothorax. Mediastinal contours appear intact. Calcification of the aorta. Postoperative changes in the cervical spine. IMPRESSION: Cardiac enlargement.  No evidence of active pulmonary disease. Electronically Signed   By: Lucienne Capers M.D.   On: 03/04/2022 20:42  ? ?ECHOCARDIOGRAM COMPLETE ? ?Result Date: 03/05/2022 ?   ECHOCARDIOGRAM REPORT   Patient Name:   [name] Date of Exam: 03/05/2022 Medical Rec #:  [id number]       Height:       63.0 in Accession #:    [id number]      Weight:        184.5 lb Date of Birth:  [birth year]       BSA:          1.869 m? Patient Age:    71 years        BP:           151/57 mmHg Patient Gender: F               HR:           70 bpm. Exam Location:  Inpatient Procedure: 2D Echo, Cardiac Doppler and Color Doppler Indications:    Atrial fibrillation  History:        Patient has prior history of Echocardiogram examinations, most                 recent 12/06/2021. CHF, CAD, COPD, Signs/Symptoms:Shortness of                 Breath; Risk Factors:Hypertension, Diabetes and Dyslipidemia. Hx                 stroke. ESRD.  Sonographer:    Clayton Lefort RDCS (AE) Referring Phys: [id number] ASIA B [place]  Sonographer Comments: Patient is morbidly obese. Image acquisition challenging due to patient body habitus. IMPRESSIONS  1. Left ventricular ejection fraction, by estimation, is 55%%. The left ventricle has low normal function. The left ventricle has no regional wall motion abnormalities. There is mild left ventricular hypertrophy. Left ventricular diastolic parameters are indeterminate.  2. Compared to prior study, RV dysfunction is new     . Right ventricular systolic function moderate to severely decreased. The right ventricular size is mildly enlarged. There is severely elevated pulmonary artery systolic pressure.  3. Left atrial size was mildly dilated.  4. Right atrial size was mildly dilated.  5. A small pericardial effusion is present.  6. Mild mitral valve regurgitation.  7. The aortic valve is tricuspid. Aortic valve regurgitation is not visualized. Aortic valve sclerosis is present, with no evidence of aortic valve stenosis.  8. The inferior vena cava is dilated in size with <50% respiratory variability, suggesting right atrial pressure of 15 mmHg. FINDINGS  Left Ventricle: Left ventricular ejection fraction, by estimation, is 55%%. The left ventricle has low normal function. The left ventricle has no regional wall motion abnormalities. The left ventricular internal cavity  size was normal in size. There is mild left ventricular hypertrophy. Left ventricular diastolic parameters  are indeterminate. Right Ventricle: Compared to prior study, RV dysfunction is new. The right ventricular size is mildly enlarged. Right vetricular wall thickness was not assessed. Right ventricular systolic function moderate to severely decreased. There is severely elevated pulmonary artery systolic pressure. The tricuspid regurgitant velocity is 3.42 m/s, and with an assumed right atrial pressure of 15 mmHg, the estimated right ventricular systolic pressure is 51.8 mmHg. Left Atrium: Left atrial size was mildly dilated. Right Atrium: Right atrial size was mildly dilated. Pericardium: A small pericardial effusion is present. Mitral Valve: There is mild thickening of the mitral valve leaflet(s). Mild mitral valve regurgitation. MV peak gradient, 9.6 mmHg. The mean mitral valve gradient is 3.0 mmHg. Tricuspid Valve: The tricuspid valve is normal in structure. Tricuspid valve regurgitation is mild. Aortic Valve: The aortic valve is tricuspid. Aortic valve regurgitation is not visualized. Aortic valve sclerosis is present, with no evidence of aortic valve stenosis. Aortic valve mean gradient measures 3.0 mmHg. Aortic valve peak gradient measures 5.3  mmHg. Aortic valve area, by VTI measures 1.93 cm?. Pulmonic Valve: The pulmonic valve was not well visualized. Pulmonic valve regurgitation is not visualized. Aorta: The aortic root and ascending aorta are structurally normal, with no evidence of dilitation. Venous: The inferior vena cava is dilated in size with less than 50% respiratory variability, suggesting right atrial pressure of 15 mmHg. IAS/Shunts: No atrial level shunt detected by color flow Doppler.  LEFT VENTRICLE PLAX 2D LVIDd:         4.60 cm LVIDs:         3.30 cm LV PW:         1.50 cm LV IVS:        1.40 cm LVOT diam:     1.90 cm LV SV:         48 LV SV Index:   26 LVOT Area:     2.84 cm?  RIGHT  VENTRICLE            IVC RV Basal diam:  3.40 cm    IVC diam: 2.50 cm RV S prime:     6.53 cm/s TAPSE (M-mode): 0.8 cm LEFT ATRIUM             Index        RIGHT ATRIUM           Index LA diam:        4.20 cm 2.25 cm

## 2022-03-06 NOTE — Progress Notes (Signed)
OT Cancellation Note ? ?Patient Details ?Name: [name] ?MRN: [id number] ?DOB: [birth year] ? ? ?Cancelled Treatment:    Reason Eval/Treat Not Completed: Medical issues which prohibited therapy;Patient at procedure or test/ unavailable (Recent return from cath lab. Bed rest until 1730.) ? ?[name] ?Meliya Mcconahy MSOT, OTR/L ?Acute Rehab ?Pager: [phone] ?Office: [phone] ?03/06/2022, 2:24 PM ?

## 2022-03-06 NOTE — Progress Notes (Signed)
Back from the cath lab by bed awake and alert. Instructed to avoid bending right knee. Bedrest emphasized till 530 pm.  ?

## 2022-03-06 NOTE — Progress Notes (Signed)
?  Progress Note ? ? ?Patient: [name] QMG:[id number] DOB: Oct 20, 1951 DOA: 03/04/2022     2 ?DOS: the patient was seen and examined on 03/06/2022 ?  ?Brief hospital course: ?71 y/o female with history of COPD, HTN, DM, ESRD on HD, admitted with chest discomfort and new onset atrial fibrillation. She was noted to have elevated troponin. She is being seen by cardiology and plans are for cardiac cath on 4/3. Nephrology following for dialysis needs. ? ?Assessment and Plan: ?* Atrial fibrillation with RVR ([place]) ?Heart rate is better controlled ?She is on diltiazem for rate control ?She is also chronically on atenolol ?She is on heparin infusion for anticoagulation ?Will need to be started on eliquis post cath ?Echo shows normal EF, but does comment on RV systolic function moderate to severely reduced and severe pulmonary hypertension ?TSH 4.7 ? ?Pulmonary hypertension ([place]) ?Noted to have severe pulmonary hypertension on echo with RV systolic pressure of 61 mmHg ?She does have a history of COPD ? ?Hypokalemia ?resolved ? ?GERD (gastroesophageal reflux disease) ?Continue Pepcid and Protonix ?Continue to monitor ? ?Leukocytosis ?Leukocytosis at 15->13, afebrile ?When seen in the hospital few days ago was 11.7 ?No infectious symptoms ?Chest x-ray is without evidence of acute cardiopulmonary disease ? ? ? ?End stage renal disease on dialysis due to type 2 diabetes mellitus ([place]) ?Normal HD schedule is TTS ?Last HD session was 4/1 ?Nephrology following ? ?Hypertension associated with diabetes ([place]) ?Continue atenolol, torsemide ?Continue to monitor ? ?Hyperlipidemia associated with type 2 diabetes mellitus ([place]) ?Continue statin ?Continue to monitor ? ?Type II diabetes mellitus with nephropathy ([place]) ?Patient uses 25 units of basal insulin at home ?Reduced to 15 units basal insulin while in the hospital ?Sliding scale coverage ?Continue gabapentin for diabetic neuropathy ?Carb modified diet ?Monitor CBGs ? ? ? ? ?   ? ?Subjective: she is currently denying any chest pain or shortness of breath ? ?Physical Exam: ?Vitals:  ? 03/05/22 1945 03/05/22 2252 03/06/22 0422 03/06/22 0740  ?BP: (!) 164/69 (!) 154/65 (!) 144/69   ?Pulse: 63 (!) 58  70  ?Resp: 20 20 10 14   ?Temp: 97.8 ?F (36.6 ?C) (!) 97.4 ?F (36.3 ?C) 97.6 ?F (36.4 ?C) 97.7 ?F (36.5 ?C)  ?TempSrc: Oral Oral Oral Oral  ?SpO2: 95% 95% 98% 98%  ?Weight:      ?Height:      ? ?General exam: Alert, awake, oriented x 3 ?Respiratory system: Coarse breath sounds bilaterally. Respiratory effort normal. ?Cardiovascular system:RRR. No murmurs, rubs, gallops. ?Gastrointestinal system: Abdomen is nondistended, soft and nontender. No organomegaly or masses felt. Normal bowel sounds heard. ?Central nervous system: Alert and oriented. No focal neurological deficits. ?Extremities: No C/C/E, +pedal pulses ?Skin: No rashes, lesions or ulcers ?Psychiatry: Judgement and insight appear normal. Mood & affect appropriate.  ? ?Data Reviewed: ? ?Cbc reviewed with mild downtrend in wbc count. Hemoglobin stable. Creatinine elevated consistent with ESRD. TSH 4.7. Echo with normal EF  ? ?Family Communication: discussed with patient ? ?Disposition: ?Status is: Inpatient ?Remains inpatient appropriate because: for cardiac cath today ? Planned Discharge Destination: Home ? ? ? ?Time spent: 35 minutes ? ?Author: ?Kathie Dike, MD ?03/06/2022 9:52 AM ? ?For on call review [email or web address].  ?

## 2022-03-06 NOTE — Progress Notes (Signed)
ANTICOAGULATION CONSULT NOTE  ? ?Pharmacy Consult for Heparin ?Indication: atrial fibrillation ? ?Allergies  ?Allergen Reactions  ? Azithromycin   ?  Prolonged QT  ? Ace Inhibitors Cough  ? Clopidogrel Bisulfate Other (See Comments)  ?  Sick, Headache, "Felt terrible"  ? Codeine Other (See Comments)  ?  hallucinations  ? Lisinopril Cough  ? Penicillins Other (See Comments)  ?  Unknown reaction ?Did it involve swelling of the face/tongue/throat, SOB, or low BP? Unknown ?Did it involve sudden or severe rash/hives, skin peeling, or any reaction on the inside of your mouth or nose? Unknown ?Did you need to seek medical attention at a hospital or doctor's office? Unknown ?When did it last happen?      childhood allergy ?If all above answers are "NO", may proceed with cephalosporin use. ? ?  ? ? ?Patient Measurements: ?Height: 5\' 3"  (160 cm) ?Weight: 83.7 kg (184 lb 8.4 oz) ?IBW/kg (Calculated) : 52.4 ?Heparin Dosing Weight: 71 kg ? ?Vital Signs: ?Temp: 97.7 ?F (36.5 ?C) (04/03 0740) ?Temp Source: Oral (04/03 0740) ?BP: 168/98 (04/03 1315) ?Pulse Rate: 85 (04/03 1315) ? ?Labs: ?Recent Labs  ?  03/04/22 ?2041 03/04/22 ?2246 03/05/22 ?0323 03/05/22 ?0740 03/05/22 ?2004 03/06/22 ?7062  ?HGB 12.3  --   --  11.6*  --  10.9*  ?HCT 38.1  --   --  34.7*  --  32.5*  ?PLT 266  --   --  223  --  218  ?HEPARINUNFRC  --   --   --   --  0.78* 0.60  ?CREATININE 6.09*  --   --   --   --  9.17*  ?TROPONINIHS 99* 105* 181*  --   --   --   ? ? ? ?Estimated Creatinine Clearance: 5.9 mL/min (A) (by C-G formula based on SCr of 9.17 mg/dL (H)). ? ? ?Medical History: ?Past Medical History:  ?Diagnosis Date  ? Acute on chronic diastolic CHF (congestive heart failure) ([place]) 03/05/2015  ? Grade 2. EF 60-65%  ? Anemia   ? hx of  ? Anxiety   ? Arthritis   ? CAD (coronary artery disease)   ? s/p Stenting in 2005;  [place] 7/09: Vigorous LV function, 2-3+ MR, RI 40%, RI stent patent, proximal-mid RCA 40-50%;  Echo 7/09:  EF 55-65%, trivial MR;  [facility]  11/18/12: EF 66%, normal LV wall motion, mild to moderate anterior ischemia - reviewed by [facility] and felt to rep breast atten (low risk);  Echo 6/14: mild LVH, EF 55-65%, Gr 2 DD, PASP 44, trivial eff   ? Chronic kidney disease   ? CREATININE IS UP--GOING TO KIDNEY MD   ? Chronic neck pain   ? COPD (chronic obstructive pulmonary disease) ([place])   ? Degenerative joint disease   ? left shoulder  ? Diabetic neuropathy ([place])   ? Diverticulosis   ? Esophageal stricture   ? GERD (gastroesophageal reflux disease)   ? Hyperlipidemia   ? Hypertension   ? IDDM (insulin dependent diabetes mellitus)   ? Pericardial effusion 03/06/2015  ? Very small per echo ; pleural nodules seen on CT chest.  ? Peripheral vascular disease ([place])   ? Stroke [facility])   ? "mini- years ago"  ? Vitamin D deficiency   ? ? ? ?Assessment: ?71 y.o. F presents with CP.  Found to be in afib with RVR. CHADS2VASC score is 6. CBC stable. No AC PTA. Pt is ESRD pt. ? ?Heparin level within goal range this  AM. No bleeding issues noted.  ? ?Now s/p cath - pharmacy asked to resume IV heparin 8 hrs after sheath pull.  Removed ~ 1320 PM. ? ?Goal of Therapy:  ?Heparin level 0.3-0.7 units/ml ?Monitor platelets by anticoagulation protocol: Yes ?  ?Plan:  ?Resume IV heparin at 1100 units/hr at 2130 PM.  Check heparin level 8 hrs after gtt resumes. ?Daily heparin level and CBC. ?F/u TCTS consult/plans. ? ? ?[place] Crane, Pharm D, BCPS, BCCP ?Clinical Pharmacist ? 03/06/2022 2:07 PM  ? ?[facility] pharmacy phone numbers are listed on [email or web address] ? ? ?

## 2022-03-06 NOTE — Progress Notes (Signed)
Pt receives out-pt HD at [facility] on TTS. Pt has a 7:00 chair time. Will assist as needed.  ? ?Melven Sartorius ?Renal Navigator ?[phone] ?

## 2022-03-06 NOTE — Progress Notes (Signed)
Bil. Groin and left radial shaved.kept npo post midnight. Heparin placed on hold for cardiac cath. ?

## 2022-03-06 NOTE — Progress Notes (Signed)
[facility] ?Progress Note ? ?Subjective: pt seen in room today, not sleeping well at night and very tired. Poor historian today.  ? ?Summary: admit w/ CP after HD on Sat. In ED had afib/ RVR, ^trop/ demand ischemia. Had her full HD on Sat (TTS [facility]). Has been getting below her dry wt of 84 kg recently. On HD x 6 yrs.  ? ?Vitals:  ? 03/06/22 1315 03/06/22 1400 03/06/22 1435 03/06/22 1445  ?BP: (!) 168/98 (!) 150/90 (!) 158/87 (!) 164/92  ?Pulse: 85     ?Resp: 14 16 20 13   ?Temp:      ?TempSrc:      ?SpO2: 100%     ?Weight:      ?Height:      ? ? ?Exam: ? Gen alert, no distress, 2L O2 [place] ?No rash, cyanosis or gangrene ?Sclera anicteric, throat clear  ?No jvd or bruits ?Chest clear bilat to bases, no rales/ wheezing ?RRR no RG ?Abd soft ntnd no mass or ascites +bs ?GU defer ?MS no joint effusions or deformity ?Ext no LE or UE edema, no wounds or ulcers ?Neuro is alert, Ox 3 , nf ? RUA aVF+bruit ?  ? ?  4/1 CXR - no acute disease ? ? ? OP HD: [facility] TTS ? - get records ? ? ?Assessment/ Plan: ?Chest pain: presented in a.fib with RVR. On diltiazem. Rate controlled at present. Management per primary/cardiology. SP LHC which showed severe 2V CAD. TCTS is evaluating.  ? ESRD:  Attends dialysis on TTS schedule. No urgent indications for dialysis today. Next HD on Tuesday/ tomorrow.  ?Hypokalemia: labs drawn shortly after HD. Will check RFP. Hypokalemia may be contributing to a.fib episodes at end of dialysis . May need higher K+ bath.  ? Hypertension/volume: BP moderately elevated. No volume overload on exam. On diltaizem and heparin drip, will defer med titration to cardiology given a.fib. Patient reports UF goals around 3L q HD. LVEDP from cath was high at 30 mmHg.  ? Anemia: Hgb at goal. Will request outpatient ESA records.  ? Metabolic bone disease: Calcium controlled. Reports her last phosphorus level was high. Continue calcium acetate, follow phos levels.  ? ? ? ? ?Rob Doctor, hospital ?03/06/2022, 3:24  PM ? ? ?Recent Labs  ?Lab 03/04/22 ?2041 03/05/22 ?0740 03/06/22 ?4097  ?HGB 12.3 11.6* 10.9*  ?CALCIUM 8.7*  --  9.2  ?CREATININE 6.09*  --  9.17*  ?K 3.3*  --  3.7  ? ?Inpatient medications: ? aspirin EC  81 mg Oral Daily  ? atenolol  50 mg Oral BID  ? busPIRone  15 mg Oral BID  ? calcium acetate  1,334 mg Oral TID with meals  ? diltiazem  30 mg Oral Q6H  ? famotidine  20 mg Oral BID  ? furosemide  80 mg Intravenous BID  ? gabapentin  400 mg Oral q1800  ? insulin aspart  0-15 Units Subcutaneous TID WC  ? insulin aspart  0-5 Units Subcutaneous QHS  ? insulin detemir  15 Units Subcutaneous QHS  ? pantoprazole  40 mg Oral Daily  ? QUEtiapine  100 mg Oral QHS  ? rOPINIRole  1 mg Oral QHS  ? rosuvastatin  5 mg Oral Daily  ? sodium chloride flush  3 mL Intravenous Q12H  ? ? sodium chloride    ? heparin    ? nitroGLYCERIN    ? ?sodium chloride, acetaminophen **OR** acetaminophen, calcium acetate, hydrALAZINE, hydrOXYzine, labetalol, levalbuterol, nitroGLYCERIN, oxyCODONE, promethazine, sodium chloride flush ? ? ? ? ? ? ? ?

## 2022-03-06 NOTE — Progress Notes (Signed)
Cbg-64. Asymptomatic,  dextrose 505 25 mg iv given. Endorsed to cath lab RN to recheck after 15 min. Transported to the cath lab by bed awake and alert ?

## 2022-03-06 NOTE — Progress Notes (Signed)
TCTS consulted for CABG evaluation. °

## 2022-03-06 NOTE — Assessment & Plan Note (Signed)
Noted to have severe pulmonary hypertension on echo with RV systolic pressure of 61 mmHg ?She does have a history of COPD ?

## 2022-03-06 NOTE — Hospital Course (Addendum)
71 y/o female with history of COPD, HTN, DM, ESRD on HD, admitted with chest discomfort and new onset atrial fibrillation. She was noted to have elevated troponin with chest pain new onset a fib.  Seen by cardiology and underwent cardiac cath on 4/3 that showed severe two-vessel disease involving RCA and LAD.  Patient did not wish to undergo any bypass surgery, she would also be considered a high risk candidate.  Current plans are for repeat cardiac cath with PCI on 4/5 with atherectomy.Received hemodialysis for volume overload.  Status post fistulogram on 03/14/2022.  Discharge summary was done for 4/11 but canceled discharge since ursing staff reported new hematuria and rectal bleed. Patient is at difficult situation due to recent PCI with atherectomy and atrial fibrillation. Additionally patient is also requiring oxygen during hospitalization.Might need oxygen on discharge.  ? ?Atrial fibrillation with RVR ([place]) ?Rate controlled at this time.  Currently on atenolol and Eliquis, Long-acting Cardizem .  2D echocardiogram at this time showed normal EF but severe pulmonary hypertension. TSH 4.7.  Cardiology followed the patient during hospitalization and at this time have signed off. ?  ?Unstable angina ?Patient underwent cardiac catheterization on 03/06/2022 with  severe two-vessel disease involving LAD and RCA.  Patient subsequently underwent cardiac catheterization and coronary atherectomy, PCI and stenting of the severe stenosis in the mid LAD, RCA on 03/08/2022.  Continue  Brilinta, beta-blocker, statins, Eliquis..  Patient was thought to be high risk for surgery as per cardiothoracic surgery.  Plan is to continue Brilinta for 90 days followed by aspirin and Eliquis alone.  Patient will follow-up with cardiology in 1 to 2 weeks after discharge ?  ?Bright red blood per rectum overnight now complaining of hematuria.  We will check urinalysis for hematuria, stool occult blood.  Check hemoglobin and hematocrit 5 AM 5 PM.   If downgoing trend and continues bleeding, patient might need to be reassessed for combination of Brilinta and Eliquis.  Patient just had percutaneous intervention and would benefit from coagulation and antithrombotics on discharge ?  ?End stage renal disease on dialysis due to type 2 diabetes mellitus ([place]) ?Nephrology  on board for hemodialysis need.  On Tuesday Thursday Saturday schedule at baseline.     ?  ?Right upper extremity fistula site bleeding.  Seen by vascular surgery.  Underwent fistulogram and vascular procedure on 03/14/2022.   ?  ?Pulmonary hypertension ([place]) ?Does have history of COPD likely causing pulmonary hypertension.  RV systolic pressure was 61.  On supplemental oxygen, will likely need on discharge.  Will do oxygen desat study. ?  ?Type II diabetes mellitus with nephropathy ([place]) ?Patient uses 25 units of basal insulin at home and was reduced to 15 units with sliding scale while in the hospital.  Continue gabapentin for neuropathy.  POC glucose of 140. ?  ?Hypertension associated with diabetes ([place]) ?Continue atenolol, long-acting Cardizem.   Cardiology suggesting holding atenolol on the morning of hemodialysis and  and using Cardizem in the evening postdialysis for episodes of hypotension during dialysis. ?  ?Hypokalemia ?Improved after replacement.  Latest potassium 4.3 ?  ?Hyponatremia.  On hemodialysis.  Has been started on torsemide as well.  Sodium level 128. ?  ?GERD (gastroesophageal reflux disease) ?Continue Pepcid and Protonix. ?  ?Leukocytosis ?  Afebrile.  No signs of infection at this time.  Chest x-ray without any acute infiltrate.  Will need outpatient monitoring. ?  ?Obesity (BMI 30.0-34.9) ?BMI 31.05.  Would benefit from weight loss as outpatient. ?  ?  Hyperlipidemia associated with type 2 diabetes mellitus ([place]) ?Continue statin ?  ?Weakness and deconditioning.  Physical therapy has seen the patient and the plan is home health on discharge. ?  ?

## 2022-03-06 NOTE — Plan of Care (Signed)
?  Problem: Cardiac: ?Goal: Ability to achieve and maintain adequate cardiopulmonary perfusion will improve ?Outcome: Progressing ?  ?Problem: Health Behavior/Discharge Planning: ?Goal: Ability to safely manage health-related needs after discharge will improve ?Outcome: Progressing ?  ?Problem: Activity: ?Goal: Ability to return to baseline activity level will improve ?Outcome: Progressing ?  ?Problem: Cardiovascular: ?Goal: Ability to achieve and maintain adequate cardiovascular perfusion will improve ?Outcome: Progressing ?Goal: Vascular access site(s) Level 0-1 will be maintained ?Outcome: Progressing ?  ?

## 2022-03-06 NOTE — Progress Notes (Signed)
Inpatient Diabetes Program Recommendations ? ?AACE/ADA: New Consensus Statement on Inpatient Glycemic Control (2015) ? ?Target Ranges:  Prepandial:   less than 140 mg/dL ?     Peak postprandial:   less than 180 mg/dL (1-2 hours) ?     Critically ill patients:  140 - 180 mg/dL  ? ?Lab Results  ?Component Value Date  ? GLUCAP 104 (H) 03/06/2022  ? HGBA1C 7.3 (H) 12/06/2021  ? ? ?Review of Glycemic Control ? Latest Reference Range & Units 03/05/22 11:15 03/05/22 16:55 03/05/22 21:39 03/06/22 06:41 03/06/22 08:49 03/06/22 11:22 03/06/22 11:49 03/06/22 13:01  ?Glucose-Capillary 70 - 99 mg/dL 293 (H) ?Novolog 8 units 164 (H) ?Novolog 3 units 172 (H) 129 (H) ?Novolog 2 units 83 64 (L) 133 (H) 104 (H)  ?(H): Data is abnormally high ?(L): Data is abnormally low ? ?Diabetes history: DM2 ?Outpatient Diabetes medications: Ozempic 0.5 mg q week ?Current orders for Inpatient glycemic control: Levemir 15 units qd, Novolog 0-15 units tid correction, hs 0-5 units ? ?Inpatient Diabetes Program Recommendations:   ?Patient had hypoglycemia of 64 post correction this am. ?Please consider: ?-Decrease Novolog correction to 0-9 units tid, 0-5 units hs ?Secure chat sent to Dr. Roderic Palau. ? ?Thank you, ?Nani Gasser Naveen Lorusso, RN, MSN, CDE  ?Diabetes Coordinator ?Inpatient Glycemic Control Team ?Team Pager [phone] (8am-5pm) ?03/06/2022 3:23 PM ? ? ? ?

## 2022-03-06 NOTE — Interval H&P Note (Signed)
History and Physical Interval Note: ? ?03/06/2022 ?11:58 AM ? ?[name]  has presented today for surgery, with the diagnosis of NSTEMI.  The various methods of treatment have been discussed with the patient and family. After consideration of risks, benefits and other options for treatment, the patient has consented to  Procedure(s): ?LEFT HEART CATH AND CORONARY ANGIOGRAPHY (N/A) as a surgical intervention.  [name] history has been reviewed, patient examined, no change in status, stable for surgery.  I have reviewed the patient's chart and labs.  Questions were answered to the patient's satisfaction.   ? ?Cath Lab Visit (complete for each Cath Lab visit) ? ?Clinical Evaluation Leading to the Procedure:  ? ?ACS: Yes.   ? ?Non-ACS:  N/A ? ?[name] ? ? ?

## 2022-03-07 DIAGNOSIS — E1169 Type 2 diabetes mellitus with other specified complication: Secondary | ICD-10-CM | POA: Diagnosis not present

## 2022-03-07 DIAGNOSIS — E1122 Type 2 diabetes mellitus with diabetic chronic kidney disease: Secondary | ICD-10-CM | POA: Diagnosis not present

## 2022-03-07 DIAGNOSIS — R778 Other specified abnormalities of plasma proteins: Secondary | ICD-10-CM | POA: Diagnosis not present

## 2022-03-07 DIAGNOSIS — I4891 Unspecified atrial fibrillation: Secondary | ICD-10-CM | POA: Diagnosis not present

## 2022-03-07 LAB — HEPATITIS B SURFACE ANTIBODY,QUALITATIVE: Hep B S Ab: NONREACTIVE

## 2022-03-07 LAB — CBC
HCT: 31.9 % — ABNORMAL LOW (ref 36.0–46.0)
Hemoglobin: 10.6 g/dL — ABNORMAL LOW (ref 12.0–15.0)
MCH: 28 pg (ref 26.0–34.0)
MCHC: 33.2 g/dL (ref 30.0–36.0)
MCV: 84.4 fL (ref 80.0–100.0)
Platelets: 205 10*3/uL (ref 150–400)
RBC: 3.78 MIL/uL — ABNORMAL LOW (ref 3.87–5.11)
RDW: 16.9 % — ABNORMAL HIGH (ref 11.5–15.5)
WBC: 15.6 10*3/uL — ABNORMAL HIGH (ref 4.0–10.5)
nRBC: 0 % (ref 0.0–0.2)

## 2022-03-07 LAB — HEPARIN LEVEL (UNFRACTIONATED)
Heparin Unfractionated: <0.1 IU/mL — ABNORMAL LOW (ref 0.30–0.70)
Heparin Unfractionated: 0.34 IU/mL (ref 0.30–0.70)

## 2022-03-07 LAB — RENAL FUNCTION PANEL
Albumin: 3.1 g/dL — ABNORMAL LOW (ref 3.5–5.0)
Anion gap: 15 (ref 5–15)
BUN: 66 mg/dL — ABNORMAL HIGH (ref 8–23)
CO2: 23 mmol/L (ref 22–32)
Calcium: 8.9 mg/dL (ref 8.9–10.3)
Chloride: 93 mmol/L — ABNORMAL LOW (ref 98–111)
Creatinine, Ser: 10.2 mg/dL — ABNORMAL HIGH (ref 0.44–1.00)
GFR, Estimated: 4 mL/min — ABNORMAL LOW (ref >60)
Glucose, Bld: 103 mg/dL — ABNORMAL HIGH (ref 70–99)
Phosphorus: 8.5 mg/dL — ABNORMAL HIGH (ref 2.5–4.6)
Potassium: 3.6 mmol/L (ref 3.5–5.1)
Sodium: 131 mmol/L — ABNORMAL LOW (ref 135–145)

## 2022-03-07 LAB — GLUCOSE, CAPILLARY
Glucose-Capillary: 90 mg/dL (ref 70–99)
Glucose-Capillary: 103 mg/dL — ABNORMAL HIGH (ref 70–99)
Glucose-Capillary: 203 mg/dL — ABNORMAL HIGH (ref 70–99)
Glucose-Capillary: 221 mg/dL — ABNORMAL HIGH (ref 70–99)

## 2022-03-07 LAB — HEMOGLOBIN A1C
Hgb A1c MFr Bld: 8 % — ABNORMAL HIGH (ref 4.8–5.6)
Mean Plasma Glucose: 183 mg/dL

## 2022-03-07 LAB — HEPATITIS B SURFACE ANTIGEN: Hepatitis B Surface Ag: NONREACTIVE

## 2022-03-07 MED ORDER — SODIUM CHLORIDE 0.9 % IV SOLN: 250.0000 mL | INTRAVENOUS | Status: DC | PRN

## 2022-03-07 MED ORDER — SODIUM CHLORIDE 0.9% FLUSH
3.0000 mL | Freq: Two times a day (BID) | INTRAVENOUS | Status: DC
  Administered 2022-03-07 – 2022-03-15 (×9): 3 mL via INTRAVENOUS

## 2022-03-07 MED ORDER — SODIUM CHLORIDE 0.9% FLUSH: 3.0000 mL | INTRAVENOUS | Status: DC | PRN

## 2022-03-07 MED ORDER — ASPIRIN EC 81 MG PO TBEC
81.0000 mg | DELAYED_RELEASE_TABLET | Freq: Every day | ORAL | Status: DC
  Administered 2022-03-09 – 2022-03-10 (×2): 81 mg via ORAL
  Filled 2022-03-07 (×2): qty 1

## 2022-03-07 MED ORDER — POTASSIUM CHLORIDE CRYS ER 20 MEQ PO TBCR
40.0000 meq | EXTENDED_RELEASE_TABLET | Freq: Once | ORAL | Status: AC
  Administered 2022-03-07: 40 meq via ORAL
  Filled 2022-03-07: qty 2

## 2022-03-07 MED ORDER — ASPIRIN 81 MG PO CHEW
81.0000 mg | CHEWABLE_TABLET | ORAL | Status: AC
  Administered 2022-03-08: 81 mg via ORAL
  Filled 2022-03-07: qty 1

## 2022-03-07 MED ORDER — SODIUM CHLORIDE 0.9 % IV SOLN: INTRAVENOUS | Status: DC

## 2022-03-07 MED FILL — Fentanyl Citrate Preservative Free (PF) Inj 100 MCG/2ML: INTRAMUSCULAR | Qty: 2 | Status: AC

## 2022-03-07 MED FILL — Midazolam HCl Inj 2 MG/2ML (Base Equivalent): INTRAMUSCULAR | Qty: 2 | Status: AC

## 2022-03-07 NOTE — Evaluation (Signed)
Occupational Therapy Evaluation ?Patient Details ?Name: [name] ?MRN: [id number] ?DOB: 06-Nov-1951 ?Today's Date: 03/07/2022 ? ? ?History of Present Illness Patient is a 71 y/o female who presents on 4/1 with chest pressure and tightness and SOB. Found to have new onset A-fib with RVR. PMH includes CKD on HD, CAD, CHF, COPD, HTN, PVD, neuopathy, CVA.  ? ?Clinical Impression ?  ?PT admitted with CP and SOB. Pt currently with functional limitiations due to the deficits listed below (see OT problem list). Pt reports that she is not wanting any open heart surgery but glad she came to [facility] because we have figured out what the cause of her SOB has been for years. Pt reports wanting to be able to drive herself to HD. Pt reports dizziness during session and fatigue. Pt lives with 84 yo mother that still drives.  Pt will benefit from skilled OT to increase their independence and safety with adls and balance to allow discharge [place]. ?  ?   ? ?Recommendations for follow up therapy are one component of a multi-disciplinary discharge planning process, led by the attending physician.  Recommendations may be updated based on patient status, additional functional criteria and insurance authorization.  ? ?Follow Up Recommendations ? Home health OT  ?  ?Assistance Recommended at Discharge Set up Supervision/Assistance  ?Patient can return home with the following A little help with walking and/or transfers;A little help with bathing/dressing/bathroom ? ?  ?Functional Status Assessment ? Patient has had a recent decline in their functional status and demonstrates the ability to make significant improvements in function in a reasonable and predictable amount of time.  ?Equipment Recommendations ? None recommended by OT  ?  ?Recommendations for Other Services   ? ? ?  ?Precautions / Restrictions Precautions ?Precautions: Fall;Other (comment) ?Precaution Comments: watch HR ?Restrictions ?Weight Bearing Restrictions: No  ? ?   ? ?Mobility Bed Mobility ?  ?  ?  ?  ?  ?  ?  ?General bed mobility comments: Sitting EOB upon PT arrival. ?  ? ?Transfers ?Overall transfer level: Needs assistance ?Equipment used: Rolling walker (2 wheels) ?Transfers: Sit to/from Stand ?Sit to Stand: Min assist, +2 safety/equipment ?  ?  ?  ?  ?  ?General transfer comment: Min A to power to standing with cues for hand placement/technique. Stood from [employer], from chair x1. ?  ? ?  ?Balance Overall balance assessment: Needs assistance ?Sitting-balance support: Feet supported, No upper extremity supported ?Sitting balance-Leahy Scale: Good ?Sitting balance - Comments: Able to sit EOB and eat lunch without difficulty. ?  ?Standing balance support: Reliant on assistive device for balance, During functional activity ?Standing balance-Leahy Scale: Poor ?Standing balance comment: Requires Ue support in standing. ?  ?  ?  ?  ?  ?  ?  ?  ?  ?  ?  ?   ? ?ADL either performed or assessed with clinical judgement  ? ?ADL Overall ADL's : Needs assistance/impaired ?Eating/Feeding: Modified independent ?  ?Grooming: Dance movement psychotherapist;Wash/dry hands;Supervision/safety;Sitting ?  ?Upper Body Bathing: Supervision/ safety;Sitting ?  ?Lower Body Bathing: Moderate assistance;Sit to/from stand ?  ?Upper Body Dressing : Supervision/safety;Cueing for UE precautions ?  ?Lower Body Dressing: Moderate assistance;Sit to/from stand ?  ?Toilet Transfer: +2 for physical assistance;Minimal assistance ?  ?  ?  ?  ?  ?Functional mobility during ADLs: Minimal assistance;+2 for physical assistance;+2 for safety/equipment;Rolling walker (2 wheels) ?General ADL Comments: pt sitting in chair completing UB bathing at end of session.  ? ? ? ?  Vision Baseline Vision/History: 0 No visual deficits ?Patient Visual Report: No change from baseline ?   ?   ?Perception   ?  ?Praxis   ?  ? ?Pertinent Vitals/Pain Pain Assessment ?Pain Assessment: Faces ?Faces Pain Scale: Hurts even more ?Pain Location: BLEs ?Pain  Descriptors / Indicators: Sore ?Pain Intervention(s): Monitored during session  ? ? ? ?Hand Dominance Right ?  ?Extremity/Trunk Assessment Upper Extremity Assessment ?Upper Extremity Assessment: Overall WFL for tasks assessed ?  ?Lower Extremity Assessment ?Lower Extremity Assessment: Defer to PT evaluation ?RLE Sensation: history of peripheral neuropathy ?LLE Sensation: history of peripheral neuropathy ?  ?Cervical / Trunk Assessment ?Cervical / Trunk Assessment: Kyphotic ?  ?Communication Communication ?Communication: No difficulties ?  ?Cognition Arousal/Alertness: Awake/alert ?Behavior During Therapy: [facility] for tasks assessed/performed ?Overall Cognitive Status: Within Functional Limits for tasks assessed ?  ?  ?  ?  ?  ?  ?  ?  ?  ?  ?  ?  ?  ?  ?  ?  ?  ?  ?  ?General Comments  HR 130 max  Afib ? ?  ?Exercises   ?  ?Shoulder Instructions    ? ? ?Home Living Family/patient expects to be discharged to:: Private residence ?Living Arrangements: Parent (mother) ?Available Help at Discharge: Family;Available PRN/intermittently ?Type of Home: House ?Home Access: Stairs to enter ?Entrance Stairs-Number of Steps: 2 ?Entrance Stairs-Rails: Right;Left ?Home Layout: One level ?  ?  ?Bathroom Shower/Tub: Sponge bathes at baseline;Walk-in shower ?  ?Bathroom Toilet: Standard ?  ?  ?Home Equipment: Clinical biochemist (2 wheels);Cane - single point ?  ?  ?  ? ?  ?Prior Functioning/Environment Prior Level of Function : Independent/Modified Independent ?  ?  ?  ?  ?  ?  ?Mobility Comments: Independent, drives to dialysis, does not cook (96 y/o mother does) ?ADLs Comments: Independent ?  ? ?  ?  ?OT Problem List: Decreased strength;Impaired balance (sitting and/or standing);Decreased activity tolerance;Decreased safety awareness;Decreased knowledge of use of DME or AE;Decreased knowledge of precautions;Cardiopulmonary status limiting activity;Obesity ?  ?   ?OT Treatment/Interventions: Self-care/ADL  training;Therapeutic exercise;Energy conservation;DME and/or AE instruction;Therapeutic activities;Patient/family education;Balance training  ?  ?OT Goals(Current goals can be found in the care plan section) Acute Rehab OT Goals ?Patient Stated Goal: to be abel to drive again ?OT Goal Formulation: With patient ?Time For Goal Achievement: 03/21/22 ?Potential to Achieve Goals: Good  ?OT Frequency: Min 2X/week ?  ? ?Co-evaluation   ?  ?  ?  ?  ? ?  ?AM-PAC OT "6 Clicks" Daily Activity     ?Outcome Measure Help from another person eating meals?: None ?Help from another person taking care of personal grooming?: None ?Help from another person toileting, which includes using toliet, bedpan, or urinal?: A Little ?Help from another person bathing (including washing, rinsing, drying)?: A Little ?Help from another person to put on and taking off regular upper body clothing?: A Little ?Help from another person to put on and taking off regular lower body clothing?: A Little ?6 Click Score: 20 ?  ?End of Session Equipment Utilized During Treatment: Rolling walker (2 wheels);Gait belt ?Nurse Communication: Mobility status;Precautions ? ?Activity Tolerance: Patient tolerated treatment well ?Patient left: in chair;with call bell/phone within reach;with chair alarm set ? ?OT Visit Diagnosis: Unsteadiness on feet (R26.81);Muscle weakness (generalized) (M62.81)  ?              ?Time: 6295-2841 ?OT Time Calculation (min): 43 min ?Charges:  OT General  Charges ?$OT Visit: 1 Visit ?OT Evaluation ?$OT Eval Moderate Complexity: 1 Mod ? ? ?[name], OTR/L  ?Acute Rehabilitation Services ?Pager: [phone] ?Office: [phone] ?. ? ? ?[name] ?03/07/2022, 3:22 PM ?

## 2022-03-07 NOTE — Progress Notes (Signed)
OT Cancellation Note ? ?Patient Details ?Name: [name] ?MRN: [id number] ?DOB: 08-Jul-1951 ? ? ?Cancelled Treatment:    Reason Eval/Treat Not Completed: Patient at procedure or test/ unavailable (HD) ? ?[name] ?03/07/2022, 8:26 AM ?

## 2022-03-07 NOTE — Progress Notes (Addendum)
?   03/07/22 1129  ?Vitals  ?Temp 98.6 ?F (37 ?C)  ?Temp Source Oral  ?BP (!) 155/71  ?BP Location Left Arm  ?BP Method Automatic  ?Patient Position (if appropriate) Lying  ?Pulse Rate 100  ?Pulse Rate Source Monitor  ?Resp (!) 24  ?Oxygen Therapy  ?SpO2 100 %  ?O2 Device Nasal Cannula  ?O2 Flow Rate (L/min) 2 L/min  ?Pain Assessment  ?Pain Scale 0-10  ?Pain Score 0  ?Post-Hemodialysis Assessment  ?Rinseback Volume (mL) 250 mL  ?KECN 223 V  ?Dialyzer Clearance Lightly streaked  ?Duration of HD Treatment -hour(s) 3.5 hour(s)  ?Hemodialysis Intake (mL) 500 mL  ?UF Total -Machine (mL) 3500 mL  ?Net UF (mL) 3000 mL  ?Tolerated HD Treatment  ?(see progress note)  ?Post-Hemodialysis Comments tx complete, pt stable  ?AVG/AVF Arterial Site Held (minutes) 7 minutes  ?AVG/AVF Venous Site Held (minutes) 7 minutes  ? ?HD tx complete, pt stable. UF paused for approximately 30 minutes due to reports of chest tightness. Pt reports relief after UF temporarily paused, no complaints at this time. Pt stable.  Pt reports occurs sporadically, cardiac surgeon on unit and assessing pt at this time. Dr. Jonnie Finner made aware. ?

## 2022-03-07 NOTE — Progress Notes (Signed)
ANTICOAGULATION CONSULT NOTE  ? ?Pharmacy Consult for Heparin ?Indication: atrial fibrillation ? ?Allergies  ?Allergen Reactions  ? Azithromycin   ?  Prolonged QT  ? Ace Inhibitors Cough  ? Clopidogrel Bisulfate Other (See Comments)  ?  Sick, Headache, "Felt terrible"  ? Codeine Other (See Comments)  ?  hallucinations  ? Lisinopril Cough  ? Penicillins Other (See Comments)  ?  Unknown reaction ?Did it involve swelling of the face/tongue/throat, SOB, or low BP? Unknown ?Did it involve sudden or severe rash/hives, skin peeling, or any reaction on the inside of your mouth or nose? Unknown ?Did you need to seek medical attention at a hospital or doctor's office? Unknown ?When did it last happen?      childhood allergy ?If all above answers are "NO", may proceed with cephalosporin use. ? ?  ? ? ?Patient Measurements: ?Height: 5\' 3"  (160 cm) ?Weight: 79.5 kg (175 lb 4.3 oz) ?IBW/kg (Calculated) : 52.4 ?Heparin Dosing Weight: 71 kg ? ?Vital Signs: ?Temp: 97.6 ?F (36.4 ?C) (04/04 1212) ?Temp Source: Oral (04/04 1212) ?BP: 134/63 (04/04 1212) ?Pulse Rate: 91 (04/04 1212) ? ?Labs: ?Recent Labs  ?  03/04/22 ?2041 03/04/22 ?2246 03/05/22 ?0323 03/05/22 ?0740 03/05/22 ?2004 03/06/22 ?1740 03/07/22 ?0112 03/07/22 ?0805  ?HGB 12.3  --   --  11.6*  --  10.9* 10.6*  --   ?HCT 38.1  --   --  34.7*  --  32.5* 31.9*  --   ?PLT 266  --   --  223  --  218 205  --   ?HEPARINUNFRC  --   --   --   --    < > 0.60 <0.10* 0.34  ?CREATININE 6.09*  --   --   --   --  9.17* 10.20*  --   ?TROPONINIHS 99* 105* 181*  --   --   --   --   --   ? < > = values in this interval not displayed.  ? ? ? ?Estimated Creatinine Clearance: 5.2 mL/min (A) (by C-G formula based on SCr of 10.2 mg/dL (H)). ? ? ?Medical History: ?Past Medical History:  ?Diagnosis Date  ? Acute on chronic diastolic CHF (congestive heart failure) ([place]) 03/05/2015  ? Grade 2. EF 60-65%  ? Anemia   ? hx of  ? Anxiety   ? Arthritis   ? CAD (coronary artery disease)   ? s/p Stenting in 2005;   [place] 7/09: Vigorous LV function, 2-3+ MR, RI 40%, RI stent patent, proximal-mid RCA 40-50%;  Echo 7/09:  EF 55-65%, trivial MR;  [place] 11/18/12: EF 66%, normal LV wall motion, mild to moderate anterior ischemia - reviewed by [facility] and felt to rep breast atten (low risk);  Echo 6/14: mild LVH, EF 55-65%, Gr 2 DD, PASP 44, trivial eff   ? Chronic kidney disease   ? CREATININE IS UP--GOING TO KIDNEY MD   ? Chronic neck pain   ? COPD (chronic obstructive pulmonary disease) ([place])   ? Degenerative joint disease   ? left shoulder  ? Diabetic neuropathy ([place])   ? Diverticulosis   ? Esophageal stricture   ? GERD (gastroesophageal reflux disease)   ? Hyperlipidemia   ? Hypertension   ? IDDM (insulin dependent diabetes mellitus)   ? Pericardial effusion 03/06/2015  ? Very small per echo ; pleural nodules seen on CT chest.  ? Peripheral vascular disease ([place])   ? Stroke [facility])   ? "mini- years ago"  ? Vitamin  D deficiency   ? ? ? ?Assessment: ?71 y.o. F presents with CP. Found to be in afib with RVR. CHADS2VASC score is 6. CBC stable. No AC PTA. Pt is ESRD on HD TTS. ? ?Heparin resumed s/p cath 4/3. Level therapeutic this morning. Hg low stable, plt wnl. No bleed issues reported. ? ?Goal of Therapy:  ?Heparin level 0.3-0.7 units/ml ?Monitor platelets by anticoagulation protocol: Yes ?  ?Plan:  ?Continue IV heparin at 1100 units/hr   ?Monitor daily heparin level and CBC, s/sx bleeding ?Arthrectomy planned for 4/5 ?Cardiology planning apixaban post-procedure per notes ? ? ?Arturo Morton, PharmD, BCPS ?Please check AMION for all [place] contact numbers ?Clinical Pharmacist ?03/07/2022 1:58 PM ? ?

## 2022-03-07 NOTE — Progress Notes (Signed)
? ?Progress Note ? ?Patient Name: [name] ?Date of Encounter: 03/07/2022 ? ?Primary Cardiologist:   Minus Breeding, MD ? ? ?Subjective  ? ?No chest pain and slept well.  She does get SOB acutely with dialysis  ? ?Inpatient Medications  ?  ?Scheduled Meds: ? aspirin EC  81 mg Oral Daily  ? atenolol  50 mg Oral BID  ? busPIRone  15 mg Oral BID  ? calcium acetate  1,334 mg Oral TID with meals  ? diltiazem  30 mg Oral Q6H  ? famotidine  20 mg Oral BID  ? furosemide  80 mg Intravenous BID  ? gabapentin  400 mg Oral q1800  ? insulin aspart  0-5 Units Subcutaneous QHS  ? insulin aspart  0-6 Units Subcutaneous TID WC  ? insulin detemir  15 Units Subcutaneous QHS  ? pantoprazole  40 mg Oral Daily  ? QUEtiapine  100 mg Oral QHS  ? rOPINIRole  1 mg Oral QHS  ? rosuvastatin  5 mg Oral Daily  ? sodium chloride flush  3 mL Intravenous Q12H  ? ?Continuous Infusions: ? sodium chloride    ? heparin 1,100 Units/hr (03/06/22 2239)  ? nitroGLYCERIN Stopped (03/07/22 0640)  ? ?PRN Meds: ?sodium chloride, acetaminophen **OR** acetaminophen, calcium acetate, hydrOXYzine, levalbuterol, nitroGLYCERIN, oxyCODONE, promethazine, sodium chloride flush  ? ?Vital Signs  ?  ?Vitals:  ? 03/07/22 0930 03/07/22 1000 03/07/22 1030 03/07/22 1100  ?BP: (!) 100/52 (!) 93/50 (!) 142/60 (!) 121/56  ?Pulse: 81 (!) 54 89 92  ?Resp:      ?Temp:      ?TempSrc:      ?SpO2:      ?Weight:      ?Height:      ? ? ?Intake/Output Summary (Last 24 hours) at 03/07/2022 1115 ?Last data filed at 03/06/2022 1900 ?Gross per 24 hour  ?Intake 218 ml  ?Output --  ?Net 218 ml  ? ?Filed Weights  ? 03/05/22 0704 03/07/22 0500 03/07/22 0745  ?Weight: 83.7 kg 85.9 kg 83.6 kg  ? ? ?Telemetry  ?  ?NA - Personally Reviewed ? ?ECG  ?  ?NA - Personally Reviewed ? ?Physical Exam  ? ?GEN: No acute distress.   ?Neck: No  JVD ?Cardiac: Irregular RR, no murmurs, rubs, or gallops.  ?Respiratory: Clear to auscultation bilaterally. ?GI: Soft, nontender, non-distended  ?MS: No  edema; No  deformity. ?Neuro:  Nonfocal  ?Psych: Normal affect  ? ?Labs  ?  ?Chemistry ?Recent Labs  ?Lab 03/04/22 ?2041 03/06/22 ?6222 03/07/22 ?0112  ?NA 133* 132* 131*  ?K 3.3* 3.7 3.6  ?CL 92* 93* 93*  ?CO2 27 24 23   ?GLUCOSE 274* 135* 103*  ?BUN 34* 60* 66*  ?CREATININE 6.09* 9.17* 10.20*  ?CALCIUM 8.7* 9.2 8.9  ?ALBUMIN  --   --  3.1*  ?GFRNONAA 7* 4* 4*  ?ANIONGAP 14 15 15   ?  ? ?Hematology ?Recent Labs  ?Lab 03/05/22 ?0740 03/06/22 ?9798 03/07/22 ?0112  ?WBC 13.7* 13.6* 15.6*  ?RBC 4.12 3.85* 3.78*  ?HGB 11.6* 10.9* 10.6*  ?HCT 34.7* 32.5* 31.9*  ?MCV 84.2 84.4 84.4  ?MCH 28.2 28.3 28.0  ?MCHC 33.4 33.5 33.2  ?RDW 17.1* 17.3* 16.9*  ?PLT 223 218 205  ? ? ?Cardiac EnzymesNo results for input(s): TROPONINI in the last 168 hours. No results for input(s): TROPIPOC in the last 168 hours.  ? ?BNP ?Recent Labs  ?Lab 02/28/22 ?2351  ?BNP 1,695.0*  ?  ? ?DDimer No results for input(s): DDIMER  in the last 168 hours.  ? ?Radiology  ?  ?CARDIAC CATHETERIZATION ? ?Result Date: 03/06/2022 ?Conclusions: Severe two-vessel coronary artery disease with 80-90% mid LAD and 99% proximal RCA stenoses (left-to-right collaterals noted). Patent ramus intermedius stent with minimal in-stent restenosis. Moderately elevated left ventricular filling pressure (LVEDP 30 mmHg). Heavily calcified right external iliac and common femoral arteries with 50% stenosis of the right CFA. Recommendations: Findings discussed with Dr. Percival Spanish.  We have agreed to obtain cardiac surgery consultation for CABG given severe LAD and RCA disease with associated calcification.  If she is not a suitable surgical candidate, PCI to LAD and RCA +/- atherectomy would need to be considered once volume status and blood pressure have been optimized. Initiate IV nitroglycerin and IV furosemide for antianginal therapy, BP control, and diuresis.  More aggressive fluid removal via hemodialysis should also be considered. Aggressive secondary prevention. Nelva Bush, MD [facility]  [facility]  ? ?Cardiac Studies  ? ?CARDIAC CATH: ? ?1. Left ventricular ejection fraction, by estimation, is 55%%. The left  ?ventricle has low normal function. The left ventricle has no regional wall  ?motion abnormalities. There is mild left ventricular hypertrophy. Left  ?ventricular diastolic parameters  ?are indeterminate.  ? 2. Compared to prior study, RV dysfunction is new  ?    [name] Right ventricular systolic function moderate to severely decreased.  ?The right ventricular size is mildly enlarged. There is severely elevated  ?pulmonary artery systolic pressure.  ? 3. Left atrial size was mildly dilated.  ? 4. Right atrial size was mildly dilated.  ? 5. A small pericardial effusion is present.  ? 6. Mild mitral valve regurgitation.  ? 7. The aortic valve is tricuspid. Aortic valve regurgitation is not  ?visualized. Aortic valve sclerosis is present, with no evidence of aortic  ?valve stenosis.  ? 8. The inferior vena cava is dilated in size with <50% respiratory  ?variability, suggesting right atrial pressure of 15 mmHg.  ? ?Patient Profile  ?   ?71 y.o. female with CAD (multiple prior cath with RI PCI),  HTN, Acute on Chronic Diastolic Heart Failure, prior CVA, PAD, with COPD who was seen 03/05/2022 for the evaluation of new PAF CHADSVASC 6, with chest pain at HD. ? ?Assessment & Plan  ?  ?PAF:    She will need Eliquis post procedure.   ? ?CHEST PAIN:   Disease as above.  Felt to be too high risk for OR.  Reviewed with the patient and Dr. Burt Knack.  Will plan PCI tomorrow.  Patient aware of increased complexity.  The patient understands that risks included but are not limited to stroke (1 in 1000), death (1 in 9), bleeding (1 in 200), allergic reaction [possibly serious] (1 in 200).  The patient understands and agrees to proceed.   She is aware of rotational atherectomy and high risk with this and both lesions.  ? ?ESRD:  Per primary team and nephrology.   ? ?For questions or updates, please contact [place] ?Please consult [email or web address] for contact info under Cardiology/STEMI. ?  ?Signed, ?Minus Breeding, MD  ?03/07/2022, 11:15 AM   ? ?

## 2022-03-07 NOTE — Assessment & Plan Note (Signed)
BMI 31.05 ?

## 2022-03-07 NOTE — Evaluation (Signed)
Physical Therapy Evaluation ?Patient Details ?Name: [name] ?MRN: [id number] ?DOB: January 20, 1951 ?Today's Date: 03/07/2022 ? ?History of Present Illness ? Patient is a 71 y/o female who presents on 4/1 with chest pressure and tightness and SOB. Found to have new onset A-fib with RVR. PMH includes CKD on HD, CAD, CHF, COPD, HTN, PVD, neuopathy, CVA.  ?Clinical Impression ? Patient presents with generalized weakness, impaired balance, decreased activity tolerance and impaired mobility s/p above. Pt lives at home with her 70 y/o mother and reports being independent for ADLs and drives at baseline. Today, pt requires Min A for transfers and gait training requiring use of RW for support. HR ranging from 100-130 bpm, A-fib. Recommend mobility tech follow to increase mobility while in the hospital. Will follow acutely to maximize independence and mobility prior to return home. ?   ? ?Recommendations for follow up therapy are one component of a multi-disciplinary discharge planning process, led by the attending physician.  Recommendations may be updated based on patient status, additional functional criteria and insurance authorization. ? ?Follow Up Recommendations Home health PT ? ?  ?Assistance Recommended at Discharge Frequent or constant Supervision/Assistance  ?Patient can return home with the following ? A little help with walking and/or transfers;A little help with bathing/dressing/bathroom;Assistance with cooking/housework;Assist for transportation;Help with stairs or ramp for entrance ? ?  ?Equipment Recommendations None recommended by PT  ?Recommendations for Other Services ?    ?  ?Functional Status Assessment Patient has had a recent decline in their functional status and demonstrates the ability to make significant improvements in function in a reasonable and predictable amount of time.  ? ?  ?Precautions / Restrictions Precautions ?Precautions: Fall;Other (comment) ?Precaution Comments: watch  HR ?Restrictions ?Weight Bearing Restrictions: No  ? ?  ? ?Mobility ? Bed Mobility ?  ?  ?  ?  ?  ?  ?  ?General bed mobility comments: Sitting EOB upon PT arrival. ?  ? ?Transfers ?Overall transfer level: Needs assistance ?Equipment used: Rolling walker (2 wheels) ?Transfers: Sit to/from Stand ?Sit to Stand: Min assist, +2 safety/equipment ?  ?  ?  ?  ?  ?General transfer comment: Min A to power to standing with cues for hand placement/technique. Stood from [employer], from chair x1. ?  ? ?Ambulation/Gait ?Ambulation/Gait assistance: Min assist ?Gait Distance (Feet): 16 Feet (+7') ?Assistive device: Rolling walker (2 wheels) ?Gait Pattern/deviations: Step-through pattern, Decreased stride length, Leaning posteriorly ?Gait velocity: decreased ?  ?  ?General Gait Details: Slow, mildly unsteady gait with 1 instance of LOB posteriorly with Min A needed to correct. Bil knee instability. 1 seated rest break due to "wooziness." HR ranging from 100-130 bpm A-fib. ? ?Stairs ?  ?  ?  ?  ?  ? ?Wheelchair Mobility ?  ? ?Modified Rankin (Stroke Patients Only) ?  ? ?  ? ?Balance Overall balance assessment: Needs assistance ?Sitting-balance support: Feet supported, No upper extremity supported ?Sitting balance-Leahy Scale: Good ?Sitting balance - Comments: Able to sit EOB and eat lunch without difficulty. ?  ?Standing balance support: Reliant on assistive device for balance, During functional activity ?Standing balance-Leahy Scale: Poor ?Standing balance comment: Requires Ue support in standing. ?  ?  ?  ?  ?  ?  ?  ?  ?  ?  ?  ?   ? ? ? ?Pertinent Vitals/Pain Pain Assessment ?Pain Assessment: Faces ?Faces Pain Scale: Hurts even more ?Pain Location: BLEs ?Pain Descriptors / Indicators: Sore ?Pain Intervention(s): Monitored during session  ? ? ?  Home Living Family/patient expects to be discharged to:: Private residence ?Living Arrangements: Parent (mother) ?Available Help at Discharge: Family;Available PRN/intermittently ?Type of Home:  House ?Home Access: Stairs to enter ?Entrance Stairs-Rails: Right;Left ?Entrance Stairs-Number of Steps: 2 ?  ?Home Layout: One level ?Home Equipment: Clinical biochemist (2 wheels);Cane - single point ?   ?  ?Prior Function Prior Level of Function : Independent/Modified Independent ?  ?  ?  ?  ?  ?  ?Mobility Comments: Independent, drives to dialysis, does not cook (70 y/o mother does) ?ADLs Comments: Independent ?  ? ? ?Hand Dominance  ? Dominant Hand: Right ? ?  ?Extremity/Trunk Assessment  ? Upper Extremity Assessment ?Upper Extremity Assessment: Defer to OT evaluation ?  ? ?Lower Extremity Assessment ?Lower Extremity Assessment: Generalized weakness;RLE deficits/detail;LLE deficits/detail ?RLE Sensation: history of peripheral neuropathy ?LLE Sensation: history of peripheral neuropathy ?  ? ?Cervical / Trunk Assessment ?Cervical / Trunk Assessment: Kyphotic  ?Communication  ? Communication: No difficulties  ?Cognition Arousal/Alertness: Awake/alert ?Behavior During Therapy: Operating Room Services for tasks assessed/performed ?Overall Cognitive Status: Within Functional Limits for tasks assessed ?  ?  ?  ?  ?  ?  ?  ?  ?  ?  ?  ?  ?  ?  ?  ?  ?  ?  ?  ? ?  ?General Comments General comments (skin integrity, edema, etc.): HR up to 130 bpm max A-fib with mobility. Bp stable. ? ?  ?Exercises    ? ?Assessment/Plan  ?  ?PT Assessment Patient needs continued PT services  ?PT Problem List Decreased mobility;Decreased strength;Decreased activity tolerance;Cardiopulmonary status limiting activity;Decreased balance;Impaired sensation;Decreased knowledge of use of DME ? ?   ?  ?PT Treatment Interventions Therapeutic exercise;DME instruction;Gait training;Balance training;Stair training;Patient/family education;Therapeutic activities   ? ?PT Goals (Current goals can be found in the Care Plan section)  ?Acute Rehab PT Goals ?Patient Stated Goal: to get better and go home ?PT Goal Formulation: With patient ?Time For Goal  Achievement: 03/21/22 ?Potential to Achieve Goals: Good ? ?  ?Frequency Min 3X/week ?  ? ? ?Co-evaluation   ?  ?  ?  ?  ? ? ?  ?AM-PAC PT "6 Clicks" Mobility  ?Outcome Measure Help needed turning from your back to your side while in a flat bed without using bedrails?: A Little ?Help needed moving from lying on your back to sitting on the side of a flat bed without using bedrails?: A Little ?Help needed moving to and from a bed to a chair (including a wheelchair)?: A Little ?Help needed standing up from a chair using your arms (e.g., wheelchair or bedside chair)?: A Little ?Help needed to walk in hospital room?: A Little ?Help needed climbing 3-5 steps with a railing? : A Little ?6 Click Score: 18 ? ?  ?End of Session Equipment Utilized During Treatment: Gait belt ?Activity Tolerance: Patient tolerated treatment well;Patient limited by fatigue ?Patient left: in chair;with call bell/phone within reach;with chair alarm set ?Nurse Communication: Mobility status ?PT Visit Diagnosis: Muscle weakness (generalized) (M62.81);Difficulty in walking, not elsewhere classified (R26.2);Unsteadiness on feet (R26.81) ?  ? ?Time: 1335-1410 ?PT Time Calculation (min) (ACUTE ONLY): 35 min ? ? ?Charges:   PT Evaluation ?$PT Eval Moderate Complexity: 1 Mod ?  ?  ?   ? ? ?Marisa Severin, PT, DPT ?Acute Rehabilitation Services ?Secure chat preferred ?Office [phone] ? ? ? ? ?[place] ?03/07/2022, 3:13 PM ? ?

## 2022-03-07 NOTE — Progress Notes (Addendum)
?Progress Note ? ? ?Patient: [name] KNL:[id number] DOB: 20-May-1951 DOA: 03/04/2022     3 ?DOS: the patient was seen and examined on 03/07/2022 ?  ?Brief hospital course: ?71 y/o female with history of COPD, HTN, DM, ESRD on HD, admitted with chest discomfort and new onset atrial fibrillation. She was noted to have elevated troponin.  Seen by cardiology and underwent cardiac cath on 4/3 that showed severe two-vessel disease involving RCA and LAD.  Patient did not wish to undergo any bypass surgery, she would also be considered a high risk candidate.  Current plans are for repeat cardiac cath with PCI on 4/5.  Nephrology following for dialysis needs ? ?Assessment and Plan: ?* Atrial fibrillation with RVR ([place]) ?Heart rate is better controlled ?She is on diltiazem for rate control ?She is also chronically on atenolol ?She is on heparin infusion for anticoagulation ?Will need to be started on eliquis post cath ?Echo shows normal EF, but does comment on RV systolic function moderate to severely reduced and severe pulmonary hypertension ?TSH 4.7 ? ?Coronary artery disease involving native coronary artery of native heart without angina pectoris ?Patient admitted to the hospital with chest discomfort ?Noted to have elevated troponin and concerns for unstable angina ?She underwent cardiac catheterization on 4/3 that showed severe two-vessel disease involving LAD and RCA ?Initial recommendations were to be considered for CABG ?Seen by CVTS and she was felt to be high risk for surgery, patient also did not want to undergo bypass surgery ?Further plans are for cardiac cath on 4/5 with PCI ?Continue heparin infusion, aspirin, beta-blocker, statin ? ? 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 ?Pulmonary hypertension ([place]) ?Noted to have severe pulmonary hypertension on echo with RV systolic pressure of 61 mmHg ?She  does have a history of COPD ? ?Type II diabetes mellitus with nephropathy ([place]) ?Patient uses 25 units of basal insulin at home ?Reduced to 15 units basal insulin while in the hospital ?Sliding scale coverage ?Continue gabapentin for diabetic neuropathy ?Carb modified diet ?Monitor CBGs ? ?Hypertension associated with diabetes ([place]) ?Continue atenolol, torsemide ?Continue to monitor ? ?Hypokalemia ?resolved ? ?GERD (gastroesophageal reflux disease) ?Continue Pepcid and Protonix ?Continue to monitor ? ?Leukocytosis ?Leukocytosis at 15->13, afebrile ?When seen in the hospital few days ago was 11.7 ?No infectious symptoms ?Chest x-ray is without evidence of acute cardiopulmonary disease ?Clinically, she does not appear to have an acute infection ? ? ? ?Obesity (BMI 30.0-34.9) ?BMI 31.05 ? ?Hyperlipidemia associated with type 2 diabetes mellitus ([place]) ?Continue statin ?Continue to monitor ? ? ? ? ?  ? ?Subjective: She denies any chest pain or shortness of breath. ? ?Physical Exam: ?Vitals:  ? 03/07/22 1212 03/07/22 1616 03/07/22 1900 03/07/22 2137  ?BP: 134/63 (!) 150/73 (!) 142/67 (!) 131/91  ?Pulse: 91   74  ?Resp: 17 17 14    ?Temp: 97.6 ?F (36.4 ?C) 97.8 ?F (36.6 ?C) 98.2 ?F (36.8 ?C)   ?TempSrc: Oral Oral Oral   ?SpO2:  97% 95%   ?Weight:      ?Height:      ? ?General exam: Alert, awake, oriented x 3 ?Respiratory system: Clear to auscultation. Respiratory effort normal. ?Cardiovascular system:RRR. No murmurs, rubs, gallops. ?Gastrointestinal system: Abdomen is nondistended, soft and nontender. No organomegaly or masses felt. Normal bowel sounds heard. ?Central nervous system: Alert and oriented. No focal neurological deficits. ?Extremities: No C/C/E, +pedal  pulses ?Skin: No rashes, lesions or ulcers ?Psychiatry: Judgement and insight appear normal. Mood & affect appropriate.  ? ?Data Reviewed: ? ?Chemistry reviewed.  CBC reviewed. ? ?Family Communication: Discussed with patient ? ?Disposition: ?Status is:  Inpatient ?Remains inpatient appropriate because: Further work-up for coronary disease with PCI ? Planned Discharge Destination: Home with Home Health ? ? ? ?Time spent: 35 minutes ? ?Author: ?Kathie Dike, MD ?03/07/2022 11:10 PM ? ?For on call review [email or web address].  ?

## 2022-03-07 NOTE — Progress Notes (Signed)
Spoke with primary nurse, Lavella Lemons for dialysis report this am. Informed pt is on nitro gtt, per dialysis charge nurse, Fu, pt will need to be off of gtt in order to dialyze on unit. Request made to [facility], to call for possible d/c of gtt for dialysis this am on unit if no longer needed at this time. Tanya to call back to unit with follow up. ?

## 2022-03-07 NOTE — Progress Notes (Signed)
[facility] ?Progress Note ? ?Subjective: pt seen in room today, not sleeping well at night and very tired. Poor historian today.  ? ?Summary: admit w/ CP after HD on Sat. In ED had afib/ RVR, ^trop/ demand ischemia. Had her full HD on Sat (TTS [facility]). Has been getting below her dry wt of 84 kg recently. On HD x 6 yrs.  ? ?Vitals:  ? 03/07/22 1030 03/07/22 1100 03/07/22 1124 03/07/22 1129  ?BP: (!) 142/60 (!) 121/56 124/60 (!) 155/71  ?Pulse: 89 92 92 100  ?Resp:    (!) 24  ?Temp:    98.6 ?F (37 ?C)  ?TempSrc:    Oral  ?SpO2:    100%  ?Weight:      ?Height:      ? ? ?Exam: ? Gen alert, no distress, 2L O2  ?No rash, cyanosis or gangrene ?Sclera anicteric, throat clear  ?No jvd or bruits ?Chest clear bilat to bases, no rales/ wheezing ?RRR no RG ?Abd soft ntnd no mass or ascites +bs ?GU defer ?MS no joint effusions or deformity ?Ext no LE or UE edema, no wounds or ulcers ?Neuro is alert, Ox 3 , nf ? RUA aVF+bruit ?  ? ?  4/1 CXR - no acute disease ? ? ? OP HD: [facility] TTS ? - get records ? ? ?Assessment/ Plan: ?Chest pain: presented in a.fib with RVR. On diltiazem. Rate controlled at present. Management per primary/cardiology. SP LHC which showed severe 2V CAD. TCTS is evaluating.  ? ESRD:  Attends dialysis on TTS schedule. Plan HD today.  ?Hypokalemia: K 3.6 today, will give 40 meq x 1 and use higher K+ bath w/ next HD.  ? Hypertension/volume: BP moderately elevated. No volume overload on exam. On diltiazem and atenolol. Patient reports UF goals around 3L q HD. LVEDP from cath was high at 30 mmHg. CXR 4/01 was negative.  ? Anemia: Hgb at goal. Will request outpatient ESA records.  ? Metabolic bone disease: Calcium controlled. Reports her last phosphorus level was high. Continue calcium acetate, follow phos levels.  ? ? ? ? ?Rob Doctor, hospital ?03/07/2022, 11:40 AM ? ? ?Recent Labs  ?Lab 03/06/22 ?0616 03/07/22 ?0112  ?HGB 10.9* 10.6*  ?ALBUMIN  --  3.1*  ?CALCIUM 9.2 8.9  ?PHOS  --  8.5*  ?CREATININE 9.17*  10.20*  ?K 3.7 3.6  ? ? ?Inpatient medications: ? aspirin EC  81 mg Oral Daily  ? atenolol  50 mg Oral BID  ? busPIRone  15 mg Oral BID  ? calcium acetate  1,334 mg Oral TID with meals  ? diltiazem  30 mg Oral Q6H  ? famotidine  20 mg Oral BID  ? furosemide  80 mg Intravenous BID  ? gabapentin  400 mg Oral q1800  ? insulin aspart  0-5 Units Subcutaneous QHS  ? insulin aspart  0-6 Units Subcutaneous TID WC  ? insulin detemir  15 Units Subcutaneous QHS  ? pantoprazole  40 mg Oral Daily  ? QUEtiapine  100 mg Oral QHS  ? rOPINIRole  1 mg Oral QHS  ? rosuvastatin  5 mg Oral Daily  ? sodium chloride flush  3 mL Intravenous Q12H  ? ? sodium chloride    ? heparin 1,100 Units/hr (03/06/22 2239)  ? nitroGLYCERIN Stopped (03/07/22 0640)  ? ?sodium chloride, acetaminophen **OR** acetaminophen, calcium acetate, hydrOXYzine, levalbuterol, nitroGLYCERIN, oxyCODONE, promethazine, sodium chloride flush ? ? ? ? ? ? ? ?

## 2022-03-07 NOTE — Progress Notes (Signed)
Dr. Percival Spanish recommended to place on cath schedule tomorrow with Dr. Burt Knack for atherectomy, spoke with cath lab, orders written. ?

## 2022-03-07 NOTE — Progress Notes (Signed)
ANTICOAGULATION CONSULT NOTE  ? ?Pharmacy Consult for Heparin ?Indication: atrial fibrillation ? ?Allergies  ?Allergen Reactions  ? Azithromycin   ?  Prolonged QT  ? Ace Inhibitors Cough  ? Clopidogrel Bisulfate Other (See Comments)  ?  Sick, Headache, "Felt terrible"  ? Codeine Other (See Comments)  ?  hallucinations  ? Lisinopril Cough  ? Penicillins Other (See Comments)  ?  Unknown reaction ?Did it involve swelling of the face/tongue/throat, SOB, or low BP? Unknown ?Did it involve sudden or severe rash/hives, skin peeling, or any reaction on the inside of your mouth or nose? Unknown ?Did you need to seek medical attention at a hospital or doctor's office? Unknown ?When did it last happen?      childhood allergy ?If all above answers are "NO", may proceed with cephalosporin use. ? ?  ? ? ?Patient Measurements: ?Height: 5\' 3"  (160 cm) ?Weight: 83.7 kg (184 lb 8.4 oz) ?IBW/kg (Calculated) : 52.4 ?Heparin Dosing Weight: 71 kg ? ?Vital Signs: ?Temp: 98.5 ?F (36.9 ?C) (04/04 0031) ?Temp Source: Oral (04/04 0031) ?BP: 159/82 (04/04 0031) ?Pulse Rate: 78 (04/04 0031) ? ?Labs: ?Recent Labs  ?  03/04/22 ?2041 03/04/22 ?2246 03/05/22 ?0323 03/05/22 ?0740 03/05/22 ?2004 03/06/22 ?4098 03/07/22 ?0112  ?HGB 12.3  --   --  11.6*  --  10.9* 10.6*  ?HCT 38.1  --   --  34.7*  --  32.5* 31.9*  ?PLT 266  --   --  223  --  218 205  ?HEPARINUNFRC  --   --   --   --  0.78* 0.60 <0.10*  ?CREATININE 6.09*  --   --   --   --  9.17* 10.20*  ?TROPONINIHS 99* 105* 181*  --   --   --   --   ? ? ? ?Estimated Creatinine Clearance: 5.3 mL/min (A) (by C-G formula based on SCr of 10.2 mg/dL (H)). ? ? ?Medical History: ?Past Medical History:  ?Diagnosis Date  ? Acute on chronic diastolic CHF (congestive heart failure) ([place]) 03/05/2015  ? Grade 2. EF 60-65%  ? Anemia   ? hx of  ? Anxiety   ? Arthritis   ? CAD (coronary artery disease)   ? s/p Stenting in 2005;  [place] 7/09: Vigorous LV function, 2-3+ MR, RI 40%, RI stent patent, proximal-mid RCA 40-50%;   Echo 7/09:  EF 55-65%, trivial MR;  Myoview 11/18/12: EF 66%, normal LV wall motion, mild to moderate anterior ischemia - reviewed by [facility] and felt to rep breast atten (low risk);  Echo 6/14: mild LVH, EF 55-65%, Gr 2 DD, PASP 44, trivial eff   ? Chronic kidney disease   ? CREATININE IS UP--GOING TO KIDNEY MD   ? Chronic neck pain   ? COPD (chronic obstructive pulmonary disease) ([place])   ? Degenerative joint disease   ? left shoulder  ? Diabetic neuropathy ([place])   ? Diverticulosis   ? Esophageal stricture   ? GERD (gastroesophageal reflux disease)   ? Hyperlipidemia   ? Hypertension   ? IDDM (insulin dependent diabetes mellitus)   ? Pericardial effusion 03/06/2015  ? Very small per echo ; pleural nodules seen on CT chest.  ? Peripheral vascular disease ([place])   ? Stroke [facility])   ? "mini- years ago"  ? Vitamin D deficiency   ? ? ? ?Assessment: ?71 y.o. F presents with CP.  Found to be in afib with RVR. CHADS2VASC score is 6. CBC stable. No AC PTA. Pt  is ESRD pt. ? ?Now s/p cath - pharmacy asked to resume IV heparin 8 hrs after sheath pull.  Removed ~ 1320 PM. -  ? ?Heparin resumed ~2240, lab drawn early, will continue current rate as previously therapeutic ? ?Goal of Therapy:  ?Heparin level 0.3-0.7 units/ml ?Monitor platelets by anticoagulation protocol: Yes ?  ?Plan:  ?Continue IV heparin at 1100 units/hr   ?Check heparin level 8 hrs after gtt resumes. ?Daily heparin level and CBC. ?F/u TCTS consult/plans. ? ? ?Georga Bora, PharmD ?Clinical Pharmacist ?03/07/2022 2:12 AM ?Please check AMION for all [place] numbers ? ? ?

## 2022-03-07 NOTE — Assessment & Plan Note (Signed)
?   Patient admitted to the hospital with chest discomfort ?? Noted to have elevated troponin and concerns for unstable angina ?? She underwent cardiac catheterization on 4/3 that showed severe two-vessel disease involving LAD and RCA ?? Initial recommendations were to be considered for CABG ?? Seen by CVTS and she was felt to be high risk for surgery, patient also did not want to undergo bypass surgery ?? Further plans are for cardiac cath on 4/5 with PCI ?? Continue heparin infusion, aspirin, beta-blocker, statin ? ?

## 2022-03-08 ENCOUNTER — Inpatient Hospital Stay (HOSPITAL_COMMUNITY)
Admission: EM | Disposition: A | Discharge status: Home Health | Payer: Self-pay | Source: Home / Self Care | Attending: Internal Medicine

## 2022-03-08 DIAGNOSIS — E669 Obesity, unspecified: Secondary | ICD-10-CM

## 2022-03-08 DIAGNOSIS — I2510 Atherosclerotic heart disease of native coronary artery without angina pectoris: Secondary | ICD-10-CM

## 2022-03-08 DIAGNOSIS — I214 Non-ST elevation (NSTEMI) myocardial infarction: Secondary | ICD-10-CM

## 2022-03-08 DIAGNOSIS — I4891 Unspecified atrial fibrillation: Secondary | ICD-10-CM | POA: Diagnosis not present

## 2022-03-08 DIAGNOSIS — I272 Pulmonary hypertension, unspecified: Secondary | ICD-10-CM

## 2022-03-08 DIAGNOSIS — E1122 Type 2 diabetes mellitus with diabetic chronic kidney disease: Secondary | ICD-10-CM | POA: Diagnosis not present

## 2022-03-08 DIAGNOSIS — K219 Gastro-esophageal reflux disease without esophagitis: Secondary | ICD-10-CM | POA: Diagnosis not present

## 2022-03-08 HISTORY — PX: CORONARY ATHERECTOMY: CATH118238

## 2022-03-08 HISTORY — PX: INTRAVASCULAR IMAGING/OCT: CATH118326

## 2022-03-08 LAB — RENAL FUNCTION PANEL
Albumin: 2.9 g/dL — ABNORMAL LOW (ref 3.5–5.0)
Anion gap: 12 (ref 5–15)
BUN: 43 mg/dL — ABNORMAL HIGH (ref 8–23)
CO2: 23 mmol/L (ref 22–32)
Calcium: 8.4 mg/dL — ABNORMAL LOW (ref 8.9–10.3)
Chloride: 95 mmol/L — ABNORMAL LOW (ref 98–111)
Creatinine, Ser: 7.64 mg/dL — ABNORMAL HIGH (ref 0.44–1.00)
GFR, Estimated: 5 mL/min — ABNORMAL LOW (ref >60)
Glucose, Bld: 189 mg/dL — ABNORMAL HIGH (ref 70–99)
Phosphorus: 5.7 mg/dL — ABNORMAL HIGH (ref 2.5–4.6)
Potassium: 4 mmol/L (ref 3.5–5.1)
Sodium: 130 mmol/L — ABNORMAL LOW (ref 135–145)

## 2022-03-08 LAB — CBC
HCT: 31.4 % — ABNORMAL LOW (ref 36.0–46.0)
Hemoglobin: 10.2 g/dL — ABNORMAL LOW (ref 12.0–15.0)
MCH: 28.2 pg (ref 26.0–34.0)
MCHC: 32.5 g/dL (ref 30.0–36.0)
MCV: 86.7 fL (ref 80.0–100.0)
Platelets: 164 10*3/uL (ref 150–400)
RBC: 3.62 MIL/uL — ABNORMAL LOW (ref 3.87–5.11)
RDW: 17.2 % — ABNORMAL HIGH (ref 11.5–15.5)
WBC: 13.7 10*3/uL — ABNORMAL HIGH (ref 4.0–10.5)
nRBC: 0 % (ref 0.0–0.2)

## 2022-03-08 LAB — GLUCOSE, CAPILLARY
Glucose-Capillary: 137 mg/dL — ABNORMAL HIGH (ref 70–99)
Glucose-Capillary: 153 mg/dL — ABNORMAL HIGH (ref 70–99)
Glucose-Capillary: 115 mg/dL — ABNORMAL HIGH (ref 70–99)
Glucose-Capillary: 100 mg/dL — ABNORMAL HIGH (ref 70–99)

## 2022-03-08 LAB — HEPARIN LEVEL (UNFRACTIONATED): Heparin Unfractionated: 0.39 IU/mL (ref 0.30–0.70)

## 2022-03-08 LAB — POCT ACTIVATED CLOTTING TIME
Activated Clotting Time: 275 seconds
Activated Clotting Time: 305 seconds
Activated Clotting Time: 269 seconds
Activated Clotting Time: 299 seconds
Activated Clotting Time: 293 seconds
Activated Clotting Time: 251 seconds
Activated Clotting Time: 209 seconds
Activated Clotting Time: 185 seconds

## 2022-03-08 LAB — HEPATITIS B SURFACE ANTIBODY, QUANTITATIVE: Hep B S AB Quant (Post): 6.1 m[IU]/mL — ABNORMAL LOW (ref >9.9)

## 2022-03-08 SURGERY — CORONARY ATHERECTOMY
Anesthesia: LOCAL

## 2022-03-08 MED ORDER — NITROGLYCERIN 1 MG/10 ML FOR IR/CATH LAB
INTRA_ARTERIAL | Status: DC | PRN
  Administered 2022-03-08: 150 ug via INTRACORONARY

## 2022-03-08 MED ORDER — VERAPAMIL HCL 2.5 MG/ML IV SOLN
INTRAVENOUS | Status: AC
  Filled 2022-03-08: qty 2

## 2022-03-08 MED ORDER — TICAGRELOR 90 MG PO TABS
90.0000 mg | ORAL_TABLET | Freq: Two times a day (BID) | ORAL | Status: DC
  Administered 2022-03-09 – 2022-03-15 (×12): 90 mg via ORAL
  Filled 2022-03-08 (×14): qty 1

## 2022-03-08 MED ORDER — LABETALOL HCL 5 MG/ML IV SOLN
10.0000 mg | INTRAVENOUS | Status: AC | PRN
  Administered 2022-03-08 (×2): 10 mg via INTRAVENOUS

## 2022-03-08 MED ORDER — HYDRALAZINE HCL 20 MG/ML IJ SOLN
10.0000 mg | INTRAMUSCULAR | Status: AC | PRN
  Administered 2022-03-08: 10 mg via INTRAVENOUS

## 2022-03-08 MED ORDER — GABAPENTIN 400 MG PO CAPS
400.0000 mg | ORAL_CAPSULE | ORAL | Status: DC
Start: 2022-03-09 — End: 2022-03-15
  Administered 2022-03-09 – 2022-03-14 (×3): 400 mg via ORAL
  Filled 2022-03-08 (×3): qty 1

## 2022-03-08 MED ORDER — SODIUM CHLORIDE 0.9 % IV SOLN: 250.0000 mL | INTRAVENOUS | Status: DC | PRN

## 2022-03-08 MED ORDER — LABETALOL HCL 5 MG/ML IV SOLN
INTRAVENOUS | Status: AC
  Filled 2022-03-08: qty 4

## 2022-03-08 MED ORDER — MORPHINE SULFATE (PF) 2 MG/ML IV SOLN
INTRAVENOUS | Status: AC
  Filled 2022-03-08: qty 1

## 2022-03-08 MED ORDER — IOHEXOL 350 MG/ML SOLN
INTRAVENOUS | Status: DC | PRN
Start: 2022-03-08 — End: 2022-03-08
  Administered 2022-03-08: 185 mL

## 2022-03-08 MED ORDER — SODIUM CHLORIDE 0.9 % IV SOLN
400.0000 mg | Freq: Once | INTRAVENOUS | Status: AC
  Administered 2022-03-08: 408 mg via INTRAVENOUS
  Filled 2022-03-08: qty 16

## 2022-03-08 MED ORDER — SODIUM CHLORIDE 0.9% FLUSH
3.0000 mL | INTRAVENOUS | Status: DC | PRN
Start: 2022-03-08 — End: 2022-03-15

## 2022-03-08 MED ORDER — DILTIAZEM HCL 60 MG PO TABS
60.0000 mg | ORAL_TABLET | Freq: Four times a day (QID) | ORAL | Status: DC
  Administered 2022-03-09 (×2): 60 mg via ORAL
  Filled 2022-03-08 (×2): qty 1

## 2022-03-08 MED ORDER — HYDRALAZINE HCL 20 MG/ML IJ SOLN
INTRAMUSCULAR | Status: AC
  Filled 2022-03-08: qty 1

## 2022-03-08 MED ORDER — LIDOCAINE HCL (PF) 1 % IJ SOLN
INTRAMUSCULAR | Status: DC | PRN
  Administered 2022-03-08: 10 mL

## 2022-03-08 MED ORDER — LABETALOL HCL 5 MG/ML IV SOLN
INTRAVENOUS | Status: AC
Start: 2022-03-08 — End: ?
  Filled 2022-03-08: qty 4

## 2022-03-08 MED ORDER — VIPERSLIDE LUBRICANT OPTIME: TOPICAL | Status: DC | PRN

## 2022-03-08 MED ORDER — HEPARIN SODIUM (PORCINE) 1000 UNIT/ML IJ SOLN
INTRAMUSCULAR | Status: DC | PRN
  Administered 2022-03-08: 3000 [IU] via INTRAVENOUS
  Administered 2022-03-08 (×2): 2000 [IU] via INTRAVENOUS
  Administered 2022-03-08: 8000 [IU] via INTRAVENOUS

## 2022-03-08 MED ORDER — FENTANYL CITRATE (PF) 100 MCG/2ML IJ SOLN
INTRAMUSCULAR | Status: AC
  Filled 2022-03-08: qty 2

## 2022-03-08 MED ORDER — LABETALOL HCL 5 MG/ML IV SOLN
20.0000 mg | Freq: Once | INTRAVENOUS | Status: AC
  Administered 2022-03-08: 20 mg via INTRAVENOUS

## 2022-03-08 MED ORDER — HEPARIN (PORCINE) IN NACL 1000-0.9 UT/500ML-% IV SOLN
INTRAVENOUS | Status: DC | PRN
  Administered 2022-03-08 (×4): 500 mL

## 2022-03-08 MED ORDER — HEPARIN (PORCINE) IN NACL 1000-0.9 UT/500ML-% IV SOLN
INTRAVENOUS | Status: AC
  Filled 2022-03-08: qty 1500

## 2022-03-08 MED ORDER — MIDAZOLAM HCL 2 MG/2ML IJ SOLN
INTRAMUSCULAR | Status: DC | PRN
Start: 2022-03-08 — End: 2022-03-08
  Administered 2022-03-08 (×2): 1 mg via INTRAVENOUS

## 2022-03-08 MED ORDER — MORPHINE SULFATE (PF) 4 MG/ML IV SOLN
2.0000 mg | INTRAVENOUS | Status: DC | PRN
  Administered 2022-03-08: 2 mg via INTRAVENOUS

## 2022-03-08 MED ORDER — TICAGRELOR 90 MG PO TABS
180.0000 mg | ORAL_TABLET | Freq: Once | ORAL | Status: AC
  Administered 2022-03-08: 180 mg via ORAL
  Filled 2022-03-08: qty 2

## 2022-03-08 MED ORDER — SODIUM CHLORIDE 0.9% FLUSH
3.0000 mL | Freq: Two times a day (BID) | INTRAVENOUS | Status: DC
  Administered 2022-03-08 – 2022-03-15 (×10): 3 mL via INTRAVENOUS

## 2022-03-08 MED ORDER — NITROGLYCERIN 1 MG/10 ML FOR IR/CATH LAB
INTRA_ARTERIAL | Status: AC
  Filled 2022-03-08: qty 10

## 2022-03-08 MED ORDER — LIDOCAINE HCL (PF) 1 % IJ SOLN
INTRAMUSCULAR | Status: AC
  Filled 2022-03-08: qty 30

## 2022-03-08 MED ORDER — MIDAZOLAM HCL 2 MG/2ML IJ SOLN
INTRAMUSCULAR | Status: AC
  Filled 2022-03-08: qty 2

## 2022-03-08 MED ORDER — FENTANYL CITRATE (PF) 100 MCG/2ML IJ SOLN
INTRAMUSCULAR | Status: DC | PRN
  Administered 2022-03-08: 25 ug via INTRAVENOUS

## 2022-03-08 MED ORDER — HEPARIN SODIUM (PORCINE) 1000 UNIT/ML IJ SOLN
INTRAMUSCULAR | Status: AC
  Filled 2022-03-08: qty 10

## 2022-03-08 MED ORDER — FAMOTIDINE 20 MG PO TABS
20.0000 mg | ORAL_TABLET | Freq: Every day | ORAL | Status: DC
  Administered 2022-03-09 – 2022-03-14 (×6): 20 mg via ORAL
  Filled 2022-03-08 (×6): qty 1

## 2022-03-08 SURGICAL SUPPLY — 31 items
BALL SAPPHIRE NC24 3.25X22 (BALLOONS) ×2
BALLN SAPPHIRE 2.5X15 (BALLOONS) ×4
BALLN SCOREFLEX 2.75X15 (BALLOONS) ×2
BALLN ~~LOC~~ EUPHORA RX 2.75X12 (BALLOONS) ×2
BALLN ~~LOC~~ EUPHORA RX 3.25X15 (BALLOONS) ×2
BALLOON SAPPHIRE 2.5X15 (BALLOONS) IMPLANT
BALLOON SAPPHIRE NC24 3.25X22 (BALLOONS) IMPLANT
BALLOON SCOREFLEX 2.75X15 (BALLOONS) IMPLANT
BALLOON ~~LOC~~ EUPHORA RX 2.75X12 (BALLOONS) IMPLANT
BALLOON ~~LOC~~ EUPHORA RX 3.25X15 (BALLOONS) IMPLANT
CATH DRAGONFLY OPTIS 2.7FR (CATHETERS) ×1 IMPLANT
CATH LAUNCHER 6FR AL.75 (CATHETERS) ×1 IMPLANT
CATH LAUNCHER 6FR JR4 (CATHETERS) ×1 IMPLANT
CATH VISTA GUIDE 6FR XBLAD3.5 (CATHETERS) ×1 IMPLANT
CROWN DIAMONDBACK CLASSIC 1.25 (BURR) ×1 IMPLANT
KIT ENCORE 26 ADVANTAGE (KITS) ×1 IMPLANT
KIT HEART LEFT (KITS) ×2 IMPLANT
KIT HEMO VALVE WATCHDOG (MISCELLANEOUS) ×1 IMPLANT
KIT MICROPUNCTURE NIT STIFF (SHEATH) ×1 IMPLANT
LUBRICANT VIPERSLIDE CORONARY (MISCELLANEOUS) ×1 IMPLANT
PACK CARDIAC CATHETERIZATION (CUSTOM PROCEDURE TRAY) ×2 IMPLANT
SHEATH PINNACLE 6F 10CM (SHEATH) ×1 IMPLANT
SHEATH PROBE COVER 6X72 (BAG) ×1 IMPLANT
STENT ONYX FRONTIER 3.0X18 (Permanent Stent) ×1 IMPLANT
STENT ONYX FRONTIER 3.0X34 (Permanent Stent) ×1 IMPLANT
TRANSDUCER W/STOPCOCK (MISCELLANEOUS) ×2 IMPLANT
TUBING CIL FLEX 10 FLL-RA (TUBING) ×2 IMPLANT
WIRE ASAHI GRAND SLAM 180CM (WIRE) ×1 IMPLANT
WIRE COUGAR XT STRL 190CM (WIRE) ×1 IMPLANT
WIRE EMERALD 3MM-J .035X150CM (WIRE) ×1 IMPLANT
WIRE VIPERWIRE COR FLEX .012 (WIRE) ×2 IMPLANT

## 2022-03-08 NOTE — Progress Notes (Signed)
?PROGRESS NOTE ? ? ? ?[name]  ACZ:[id number] DOB: [birth year] DOA: 03/04/2022 ?PCP: [name], MD  ? ? ?Brief Narrative:  ?Patient is a 71 years old female with history of COPD, hypertension, diabetes mellitus, end-stage renal disease on hemodialysis presented to hospital with chest discomfort and new onset atrial fibrillation.  In the ED patient was noted to have elevated troponin.  Patient underwent cardiac catheterization on 03/06/2022 which showed severe two-vessel disease involving RCA and LAD.  Patient did not wish to undergo bypass surgery so plan is PCI on 03/08/2022. Nephrology following for dialysis needs  ? ?  ?Assessment and Plan: ?* Atrial fibrillation with RVR ([place]) ?Rate controlled at this time.  On Cardizem p.o. and heparin drip.  Will need to be started on Eliquis post cath.  2D echocardiogram this time showed normal EF but severe pulmonary hypertension ?TSH 4.7 ? ?Coronary artery disease involving native coronary artery of native heart without angina pectoris ?Cardiac catheterization on 03/06/2022 with  severe two-vessel disease involving LAD and RCA.  Plan for PCI 03/08/2022.  Continue heparin infusion aspirin beta-blocker statins.  Patient was thought to be high risk for surgery as per cardiothoracic surgery.  On nitroglycerin drip due to chest discomfort. ? 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  Tuesday Thursday Saturday schedule at baseline. ? ?Pulmonary hypertension ([place]) ?Does have history of COPD likely causing pulmonary hypertension.  RV systolic pressure was 61.   ? ?Type II diabetes mellitus with nephropathy ([place]) ?Patient uses 25 units of basal insulin at home has been reduced to 15+ sliding scale while in the hospital.  Continue gabapentin for neuropathy.  ? ?Hypertension associated with diabetes ([place]) ?Continue atenolol, Cardizem ? ?Hypokalemia ?Improved after replacement.  Latest potassium of  4.0. ? ?GERD (gastroesophageal reflux disease) ?Continue Pepcid and Protonix. ? ?Leukocytosis ?Likely reactive.  Latest WBC at 13.7.  Afebrile.  No signs of infection at this time.  Chest x-ray without any acute infiltrate. ? ?Obesity (BMI 30.0-34.9) ?BMI 31.05.  Would benefit from weight loss as outpatient. ? ?Hyperlipidemia associated with type 2 diabetes mellitus ([place]) ?Continue statin ? ?Weakness and deconditioning.  Physical therapy has seen the patient and will consider home health on discharge. ? ? ? DVT prophylaxis: SCDs Start: 03/05/22 0239 ? ? ?Code Status:   ?  Code Status: Full Code ? ?Disposition: Home likely in 1 to 2 days, pending PCI, when okay with cardiology. ? ?Status is: Inpatient ?Remains inpatient appropriate because: Need for PCI, ? ? Family Communication: Spoke with the patient at bedside. ? ?Consultants:  ?Cardiology ?Cardiothoracic surgery ? ?Procedures:  ?Cardiac catheterization on 03/06/2022 ? ?Antimicrobials:  ?None ? ?Anti-infectives (From admission, onward)  ? ? None  ? ?  ? ?Subjective: ?Today, patient was seen and examined at bedside.  Patient still complains of chest tightness but denies increasing shortness of breath.  Was mildly nauseated.  Has not had a bowel movement since Saturday. ? ?Objective: ?Vitals:  ? 03/08/22 1093 03/08/22 0515 03/08/22 2355 03/08/22 0717  ?BP: (!) 154/76  140/76 (!) 150/77  ?Pulse:      ?Resp: 14   11  ?Temp: 97.9 ?F (36.6 ?C)   98 ?F (36.7 ?C)  ?TempSrc: Oral   Oral  ?SpO2: 95%   96%  ?Weight:  81.6 kg    ?Height:      ? ? ?Intake/Output Summary (Last 24 hours) at 03/08/2022 1114 ?Last data filed at 03/08/2022  0600 ?Gross per 24 hour  ?Intake 344.23 ml  ?Output 3000 ml  ?Net -2655.77 ml  ? ?Filed Weights  ? 03/07/22 0745 03/07/22 1129 03/08/22 0515  ?Weight: 83.6 kg 79.5 kg 81.6 kg  ? ? ?Physical Examination: ?Body mass index is 31.87 kg/m?.  ? ?General: Obese built, not in obvious distress, on nasal cannula oxygen ?HENT:   No scleral pallor or icterus  noted. Oral mucosa is moist.  ?Chest:  Clear breath sounds.  Diminished breath sounds bilaterally.  ?CVS: S1 &S2 heard. No murmur.  Irregular rhythm. ?Abdomen: Soft, nontender, nondistended.  Bowel sounds are heard.   ?Extremities: No cyanosis, clubbing or edema.  Peripheral pulses are palpable.  Right upper extremity extremity fistula ?Psych: Alert, awake and oriented, normal mood ?CNS:  No cranial nerve deficits.  Power equal in all extremities.   ?Skin: Warm and dry.  No rashes noted. ? ?Data Reviewed:  ? ?CBC: ?Recent Labs  ?Lab 03/04/22 ?2041 03/05/22 ?0740 03/06/22 ?8657 03/07/22 ?0112 03/08/22 ?0045  ?WBC 15.0* 13.7* 13.6* 15.6* 13.7*  ?NEUTROABS  --  11.3*  --   --   --   ?HGB 12.3 11.6* 10.9* 10.6* 10.2*  ?HCT 38.1 34.7* 32.5* 31.9* 31.4*  ?MCV 86.4 84.2 84.4 84.4 86.7  ?PLT 266 223 218 205 164  ? ? ?Basic Metabolic Panel: ?Recent Labs  ?Lab 03/04/22 ?2041 03/06/22 ?8469 03/07/22 ?0112 03/08/22 ?0045  ?NA 133* 132* 131* 130*  ?K 3.3* 3.7 3.6 4.0  ?CL 92* 93* 93* 95*  ?CO2 27 24 23 23   ?GLUCOSE 274* 135* 103* 189*  ?BUN 34* 60* 66* 43*  ?CREATININE 6.09* 9.17* 10.20* 7.64*  ?CALCIUM 8.7* 9.2 8.9 8.4*  ?PHOS  --   --  8.5* 5.7*  ? ? ?Liver Function Tests: ?Recent Labs  ?Lab 03/07/22 ?0112 03/08/22 ?0045  ?ALBUMIN 3.1* 2.9*  ? ? ? ?Radiology Studies: ?CARDIAC CATHETERIZATION ? ?Result Date: 03/06/2022 ?Conclusions: Severe two-vessel coronary artery disease with 80-90% mid LAD and 99% proximal RCA stenoses (left-to-right collaterals noted). Patent ramus intermedius stent with minimal in-stent restenosis. Moderately elevated left ventricular filling pressure (LVEDP 30 mmHg). Heavily calcified right external iliac and common femoral arteries with 50% stenosis of the right CFA. Recommendations: Findings discussed with Dr. Percival Spanish.  We have agreed to obtain cardiac surgery consultation for CABG given severe LAD and RCA disease with associated calcification.  If she is not a suitable surgical candidate, PCI to LAD  and RCA +/- atherectomy would need to be considered once volume status and blood pressure have been optimized. Initiate IV nitroglycerin and IV furosemide for antianginal therapy, BP control, and diuresis.  More aggressive fluid removal via hemodialysis should also be considered. Aggressive secondary prevention. Nelva Bush, MD [facility]  ? ? ? LOS: 4 days  ? ? ?Flora Lipps, MD ?[facility] ?03/08/2022, 11:14 AM  ? ? ?

## 2022-03-08 NOTE — Progress Notes (Signed)
OT Cancellation Note ? ?Patient Details ?Name: [name] ?MRN: [id number] ?DOB: December 08, 1950 ? ? ?Cancelled Treatment:    Reason Eval/Treat Not Completed: Patient at procedure or test/ unavailable ? ?[name] ?03/08/2022, 2:12 PM ?

## 2022-03-08 NOTE — Progress Notes (Signed)
Site area: left groin arterial sheath pulled and pressure held by Suella Broad, RN ?Site Prior to Removal:  Level 0 ?Pressure Applied For: 20 minutes ?Manual:   yes ?Patient Status During Pull:  stable ?Post Pull Site:  Level 0 ?Post Pull Instructions Given:  yes ?Post Pull Pulses Present: left anterior tibial pulse dopplered ?Dressing Applied:  gauze and tegaderm ?Bedrest begins @ 2030 ?Comments: ?  ?

## 2022-03-08 NOTE — TOC Initial Note (Signed)
Transition of Care (TOC) - Initial/Assessment Note  ? ? ?Patient Details  ?Name: [name] ?MRN: [id number] ?Date of Birth: [birth year] ? ?Transition of Care (TOC) CM/SW Contact:    ?Angelita Ingles, RN ?Phone Number:[phone] ? ?03/08/2022, 2:03 PM ? ?Clinical Narrative:                 ?TOC consulted for patient with high risk for readmission and home health needs. Patient states that she is from home where she lives with her mother . Patient does have PCP Dr. Livia Snellen at  [place] is [place] [address] Patient states that she does have transportation to appointments because she drives. Home DME consist of walker, wheelchair and cane but states that she doesn't use any of them.  ? ?Case manager offered choice for home health agency. Patient states that she has no preference. Home health referral has been accepted by [facility] with Alvis Lemmings. No other needs noted at this time.  ? ?Expected Discharge Plan: [place] ?Barriers to Discharge: Continued Medical Work up ? ? ?Patient Goals and CMS Choice ?Patient states their goals for this hospitalization and ongoing recovery are:: Ready to go home ?CMS Medicare.gov Compare Post Acute Care list provided to:: Patient ?Choice offered to / list presented to : Patient ? ?Expected Discharge Plan and Services ?Expected Discharge Plan: [place] ?In-house Referral: NA ?Discharge Planning Services: NA ?Post Acute Care Choice: Home Health ?Living arrangements for the past 2 months: [place] ?                ?DME Arranged: N/A ?DME Agency: NA ?  ?  ?  ?HH Arranged: PT, OT ?[place] Agency: [place] ?Date HH Agency Contacted: 03/08/22 ?Time [place]: 4268 ?Representative spoke with at [place]: Tommi Rumps ? ?Prior Living Arrangements/Services ?Living arrangements for the past 2 months: [place] ?Lives with:: Parents ?Patient language and need for interpreter reviewed:: Yes ?Do you feel safe going back  to the place where you live?: Yes      ?Need for Family Participation in Patient Care: No (Comment) ?Care giver support system in place?: Yes (comment) ?Current home services:  (n/a) ?Criminal Activity/Legal Involvement Pertinent to Current Situation/Hospitalization: No - Comment as needed ? ?Activities of Daily Living ?Home Assistive Devices/Equipment: Cane (specify quad or straight), CBG Meter, Bedside commode/3-in-1, Dentures (specify type), Eyeglasses, Grab bars in shower, Shower chair without back, Walker (specify type) ?ADL Screening (condition at time of admission) ?Patient's cognitive ability adequate to safely complete daily activities?: Yes ?Is the patient deaf or have difficulty hearing?: No ?Does the patient have difficulty seeing, even when wearing glasses/contacts?: No ?Does the patient have difficulty concentrating, remembering, or making decisions?: No ?Patient able to express need for assistance with ADLs?: Yes ?Does the patient have difficulty dressing or bathing?: No ?Independently performs ADLs?: Yes (appropriate for developmental age) ?Does the patient have difficulty walking or climbing stairs?: No ?Weakness of Legs: Both ?Weakness of Arms/Hands: Both ? ?Permission Sought/Granted ?  ?Permission granted to share information with : No ?   ?   ?   ?   ? ?Emotional Assessment ?Appearance:: Appears stated age ?Attitude/Demeanor/Rapport: Engaged ?Affect (typically observed): Accepting, Pleasant ?Orientation: : Oriented to Self, Oriented to Place, Oriented to  Time, Oriented to Situation ?Alcohol / Substance Use: Not Applicable ?Psych Involvement: No (comment) ? ?Admission diagnosis:  Elevated troponin [R77.8] ?ESRD on dialysis ([place]) [N18.6, Z99.2] ?Atrial fibrillation  with RVR ([place]) [I48.91] ?Patient Active Problem List  ? Diagnosis Date Noted  ? Pulmonary hypertension ([place]) 03/06/2022  ? Non-ST elevation (NSTEMI) myocardial infarction [facility])   ? Hypokalemia 03/05/2022  ? Atrial fibrillation with RVR  ([place]) 03/04/2022  ? Change in bowel habit 12/16/2021  ? Colon cancer screening 12/16/2021  ? 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  ? Family history of colonic polyps 12/16/2021  ? Acute CHF (congestive heart failure) ([place]) 12/06/2021  ? Leukocytosis 12/06/2021  ? Elevated brain natriuretic peptide (BNP) level 12/06/2021  ? Elevated troponin 12/06/2021  ? GERD (gastroesophageal reflux disease) 12/06/2021  ? Nausea 08/26/2021  ? Lumbago of lumbar region with sciatica 07/21/2021  ? Arthropathy of lumbar facet joint 06/27/2021  ? Radiculopathy, lumbar region 05/05/2021  ? Chest pain 02/19/2021  ? Essential hypertension   ? Mixed hyperlipidemia   ? Type 2 diabetes mellitus with hyperglycemia (H[facility])   ? Hypoalbuminemia   ? Bruit 01/05/2020  ? Educated about COVID-19 virus infection 01/05/2020  ? End stage renal disease on dialysis due to type 2 diabetes mellitus ([place]) 07/21/2019  ? Lung nodule 05/01/2018  ? Prolonged Q-T interval on ECG 09/06/2017  ? Coronary artery disease involving native coronary artery of native heart without angina pectoris 09/06/2017  ? GAD (generalized anxiety disorder) 03/04/2016  ? Leg pain 01/07/2016  ? Anemia of chronic disease 12/10/2015  ? Dependence on renal dialysis ([place]) 12/10/2015  ? ESRD (end stage renal disease) on dialysis ([place]) 12/10/2015  ? Osteopathy in diseases classified elsewhere, unspecified site 12/10/2015  ? Constipation 10/25/2015  ? COPD (chronic obstructive pulmonary disease) ([place]) 03/06/2015  ? Pericardial effusion 03/06/2015  ? Chronic diastolic CHF (congestive heart failure) ([place]) 03/05/2015  ? PAD (peripheral artery disease) ([place]) 04/21/2014  ? Dyspnea 09/29/2013  ? Obesity (BMI 30.0-34.9) 12/05/2011  ? Diverticulosis 03/29/2011  ? Diabetic neuropathy ([place]) 03/29/2011  ? Esophageal stricture 03/29/2011  ? Vitamin D deficiency 03/29/2011  ? Degenerative joint disease of left shoulder 03/29/2011  ? Neck pain 03/29/2011  ? Type II diabetes mellitus with nephropathy  ([place]) 04/19/2009  ? Hyperlipidemia associated with type 2 diabetes mellitus ([place]) 04/19/2009  ? Hypertension associated with diabetes ([place]) 04/19/2009  ? Coronary atherosclerosis 04/19/2009  ? ?PCP:  [name], MD ?Pharmacy:   ?[facility], [address] ?[place] [address] ?Phone: [phone] Fax: [phone] ? ? ? ? ?Social Determinants of Health (SDOH) Interventions ?  ? ?Readmission Risk Interventions ? ?  03/08/2022  ?  1:58 PM  ?Readmission Risk Prevention Plan  ?Transportation Screening Complete  ?Medication Review Press photographer) Complete  ?PCP or Specialist appointment within 3-5 days of discharge Complete  ?[facility] or Home Care Consult Complete  ?SW Recovery Care/Counseling Consult Complete  ?Palliative Care Screening Not Applicable  ?Skilled Nursing Facility Complete  ? ? ? ?

## 2022-03-08 NOTE — Care Management Important Message (Signed)
Important Message ? ?Patient Details  ?Name: [name] ?MRN: [id number] ?Date of Birth: 30-Dec-1950 ? ? ?Medicare Important Message Given:  Yes ? ? ? ? ?Trayquan Kolakowski ?03/08/2022, 3:46 PM ?

## 2022-03-08 NOTE — Progress Notes (Signed)
[facility] ?Progress Note ? ?Summary: admit w/ CP after HD on Sat. In ED had afib/ RVR, ^trop/ demand ischemia. Had her full HD on Sat (TTS [facility]). Has been getting below her dry wt of 84 kg recently. On HD x 6 yrs.  ? ?Subjective: pt seen in room, no c/o today, in good spirits.  ? ? ?Vitals:  ? 03/08/22 8938 03/08/22 0515 03/08/22 1017 03/08/22 0717  ?BP: (!) 154/76  140/76 (!) 150/77  ?Pulse:      ?Resp: 14   11  ?Temp: 97.9 ?F (36.6 ?C)   98 ?F (36.7 ?C)  ?TempSrc: Oral   Oral  ?SpO2: 95%   96%  ?Weight:  81.6 kg    ?Height:      ? ? ?Exam: ? Gen alert, no distress, 2L O2 [place] ?No jvd or bruits ?Chest clear bilat to bases ?RRR no RG ?Abd soft ntnd no mass or ascites +bs ?Ext no LE edema ?Neuro is alert, Ox 3 , nf ? RUA aVF+bruit ?  ? ?  4/1 CXR - no acute disease ? ? ? OP HD: [facility] TTS ? - 84kg dry wt (?)  RUA AVF ? - 4/04 > hep B Ag neg, and hep B Abs were low/ not protective ? ? ?Assessment/ Plan: ?Chest pain: presented in a.fib with RVR. On diltiazem. Rate controlled at present. Management per primary/cardiology. SP LHC which showed severe 2V CAD. Seen by TCTS, too high risk for surgery. Plan is for PCI intervention per cardiology, possibly today.  ? ESRD:  on HD TTS. Plan HD tomorrow.  ?Hypokalemia: K 4.0 today. 4K bath next HD.  ? Hypertension/volume: BP moderately elevated. On diltiazem and atenolol. Patient reports UF goals around 3L q HD. LVEDP from cath 4/03 was high at 30 mmHg. CXR 4/01 negative. Down 4kg below edw here. Cont to lower volume w/ HD as tolerated tomorrow.  ? Anemia: Hgb at goal. Will request outpatient ESA records.  ? Metabolic bone disease: Calcium controlled. Phos down today. Continue calcium acetate.  ? ? ? ? ?Rob Donnivan Villena ?03/08/2022, 11:21 AM ? ? ?Recent Labs  ?Lab 03/07/22 ?0112 03/08/22 ?0045  ?HGB 10.6* 10.2*  ?ALBUMIN 3.1* 2.9*  ?CALCIUM 8.9 8.4*  ?PHOS 8.5* 5.7*  ?CREATININE 10.20* 7.64*  ?K 3.6 4.0  ? ? ?Inpatient medications: ? [START ON 03/09/2022] aspirin EC  81  mg Oral Daily  ? atenolol  50 mg Oral BID  ? busPIRone  15 mg Oral BID  ? calcium acetate  1,334 mg Oral TID with meals  ? diltiazem  30 mg Oral Q6H  ? famotidine  20 mg Oral BID  ? [START ON 03/09/2022] gabapentin  400 mg Oral Q T,Th,Sat-1800  ? insulin aspart  0-5 Units Subcutaneous QHS  ? insulin aspart  0-6 Units Subcutaneous TID WC  ? insulin detemir  15 Units Subcutaneous QHS  ? pantoprazole  40 mg Oral Daily  ? QUEtiapine  100 mg Oral QHS  ? rOPINIRole  1 mg Oral QHS  ? rosuvastatin  5 mg Oral Daily  ? sodium chloride flush  3 mL Intravenous Q12H  ? sodium chloride flush  3 mL Intravenous Q12H  ? ? sodium chloride    ? sodium chloride    ? sodium chloride 10 mL/hr at 03/08/22 0700  ? aminophylline (Cath Lab Atherectomy) bolus    ? heparin 1,100 Units/hr (03/07/22 2136)  ? nitroGLYCERIN Stopped (03/07/22 0640)  ? ?sodium chloride, sodium chloride, acetaminophen **OR** acetaminophen, calcium  acetate, hydrOXYzine, levalbuterol, nitroGLYCERIN, oxyCODONE, promethazine, sodium chloride flush, sodium chloride flush ? ? ? ? ? ? ? ?

## 2022-03-08 NOTE — Progress Notes (Signed)
Physical Therapy Treatment ?Patient Details ?Name: [name] ?MRN: [id number] ?DOB: Jul 26, 1951 ?Today's Date: 03/08/2022 ? ? ?History of Present Illness Patient is a 71 y/o female who presents on 4/1 with chest pressure and tightness and SOB. Found to have new onset A-fib with RVR. PMH includes CKD on HD, CAD, CHF, COPD, HTN, PVD, neuopathy, CVA. ? ?  ?PT Comments  ? ? Pt making good progress towards their physical therapy goals, demonstrating improved activity tolerance and ambulation distance. Still reports low level chest pain and BLE claudication like pain with ambulating. Pt ambulating 250 ft with a walker at a min guard assist level, SpO2 97% on RA, HR 76-89 bpm. Will continue to progress as tolerated. ?  ?Recommendations for follow up therapy are one component of a multi-disciplinary discharge planning process, led by the attending physician.  Recommendations may be updated based on patient status, additional functional criteria and insurance authorization. ? ?Follow Up Recommendations ? Home health PT ?  ?  ?Assistance Recommended at Discharge Frequent or constant Supervision/Assistance  ?Patient can return home with the following A little help with walking and/or transfers;A little help with bathing/dressing/bathroom;Assistance with cooking/housework;Assist for transportation;Help with stairs or ramp for entrance ?  ?Equipment Recommendations ? None recommended by PT  ?  ?Recommendations for Other Services   ? ? ?  ?Precautions / Restrictions Precautions ?Precautions: Fall ?Restrictions ?Weight Bearing Restrictions: No  ?  ? ?Mobility ? Bed Mobility ?Overal bed mobility: Modified Independent ?  ?  ?  ?  ?  ?  ?  ?  ? ?Transfers ?Overall transfer level: Needs assistance ?Equipment used: Rolling walker (2 wheels) ?Transfers: Sit to/from Stand ?Sit to Stand: Supervision ?  ?  ?  ?  ?  ?  ?  ? ?Ambulation/Gait ?Ambulation/Gait assistance: Min guard ?Gait Distance (Feet): 250 Feet ?Assistive device: Rolling  walker (2 wheels) ?Gait Pattern/deviations: Step-through pattern, Decreased stride length ?Gait velocity: decreased ?  ?  ?General Gait Details: Pt with mild bilateral knee instability, but no buckle. Min guard for safety ? ? ?Stairs ?  ?  ?  ?  ?  ? ? ?Wheelchair Mobility ?  ? ?Modified Rankin (Stroke Patients Only) ?  ? ? ?  ?Balance Overall balance assessment: Needs assistance ?Sitting-balance support: Feet supported, No upper extremity supported ?Sitting balance-Leahy Scale: Good ?  ?  ?Standing balance support: Reliant on assistive device for balance, During functional activity, No upper extremity supported ?Standing balance-Leahy Scale: Fair ?  ?  ?  ?  ?  ?  ?  ?  ?  ?  ?  ?  ?  ? ?  ?Cognition Arousal/Alertness: Awake/alert ?Behavior During Therapy: [facility] for tasks assessed/performed ?Overall Cognitive Status: Within Functional Limits for tasks assessed ?  ?  ?  ?  ?  ?  ?  ?  ?  ?  ?  ?  ?  ?  ?  ?  ?  ?  ?  ? ?  ?Exercises   ? ?  ?General Comments   ?  ?  ? ?Pertinent Vitals/Pain Pain Assessment ?Pain Assessment: Faces ?Faces Pain Scale: Hurts little more ?Pain Location: BLEs ?Pain Descriptors / Indicators: Sore, Burning ?Pain Intervention(s): Monitored during session  ? ? ?Home Living   ?  ?  ?  ?  ?  ?  ?  ?  ?  ?   ?  ?Prior Function    ?  ?  ?   ? ?PT  Goals (current goals can now be found in the care plan section) Acute Rehab PT Goals ?Patient Stated Goal: to get better and go home ?Potential to Achieve Goals: Good ?Progress towards PT goals: Progressing toward goals ? ?  ?Frequency ? ? ? Min 3X/week ? ? ? ?  ?PT Plan Current plan remains appropriate  ? ? ?Co-evaluation   ?  ?  ?  ?  ? ?  ?AM-PAC PT "6 Clicks" Mobility   ?Outcome Measure ? Help needed turning from your back to your side while in a flat bed without using bedrails?: None ?Help needed moving from lying on your back to sitting on the side of a flat bed without using bedrails?: None ?Help needed moving to and from a bed to a chair (including  a wheelchair)?: A Little ?Help needed standing up from a chair using your arms (e.g., wheelchair or bedside chair)?: A Little ?Help needed to walk in hospital room?: A Little ?Help needed climbing 3-5 steps with a railing? : A Little ?6 Click Score: 20 ? ?  ?End of Session Equipment Utilized During Treatment: Gait belt ?Activity Tolerance: Patient tolerated treatment well ?Patient left: with call bell/phone within reach;in bed ?Nurse Communication: Mobility status ?PT Visit Diagnosis: Muscle weakness (generalized) (M62.81);Difficulty in walking, not elsewhere classified (R26.2);Unsteadiness on feet (R26.81) ?  ? ? ?Time: 0569-7948 ?PT Time Calculation (min) (ACUTE ONLY): 22 min ? ?Charges:  $Therapeutic Activity: 8-22 mins          ?          ? ?Wyona Almas, PT, DPT ?Acute Rehabilitation Services ?Pager [phone] ?Office [phone] ? ? ? ?[name] ?03/08/2022, 1:21 PM ? ?

## 2022-03-08 NOTE — Progress Notes (Signed)
12 lead EKG done ; having 3/10 chest tightness, Dr. Burt Knack looked at EKG. Aware of left groin site oozing. Pain med ordered. ?

## 2022-03-08 NOTE — H&P (View-Only) (Signed)
? ?Progress Note ? ?Patient Name: [name] ?Date of Encounter: 03/08/2022 ? ?Primary Cardiologist:   Minus Breeding, MD ? ? ?Subjective  ? ?She has had some low level chest pain but no SOB.  ? ?Inpatient Medications  ?  ?Scheduled Meds: ? [START ON 03/09/2022] aspirin EC  81 mg Oral Daily  ? atenolol  50 mg Oral BID  ? busPIRone  15 mg Oral BID  ? calcium acetate  1,334 mg Oral TID with meals  ? diltiazem  30 mg Oral Q6H  ? famotidine  20 mg Oral BID  ? [START ON 03/09/2022] gabapentin  400 mg Oral Q T,Th,Sat-1800  ? insulin aspart  0-5 Units Subcutaneous QHS  ? insulin aspart  0-6 Units Subcutaneous TID WC  ? insulin detemir  15 Units Subcutaneous QHS  ? pantoprazole  40 mg Oral Daily  ? QUEtiapine  100 mg Oral QHS  ? rOPINIRole  1 mg Oral QHS  ? rosuvastatin  5 mg Oral Daily  ? sodium chloride flush  3 mL Intravenous Q12H  ? sodium chloride flush  3 mL Intravenous Q12H  ? ticagrelor  180 mg Oral Once  ? ?Continuous Infusions: ? sodium chloride    ? sodium chloride    ? sodium chloride 10 mL/hr at 03/08/22 0700  ? heparin 1,100 Units/hr (03/07/22 2136)  ? nitroGLYCERIN Stopped (03/07/22 0640)  ? ?PRN Meds: ?sodium chloride, sodium chloride, acetaminophen **OR** acetaminophen, calcium acetate, hydrOXYzine, levalbuterol, nitroGLYCERIN, oxyCODONE, promethazine, sodium chloride flush, sodium chloride flush  ? ?Vital Signs  ?  ?Vitals:  ? 03/08/22 2951 03/08/22 0515 03/08/22 8841 03/08/22 0717  ?BP: (!) 154/76  140/76 (!) 150/77  ?Pulse:      ?Resp: 14   11  ?Temp: 97.9 ?F (36.6 ?C)   98 ?F (36.7 ?C)  ?TempSrc: Oral   Oral  ?SpO2: 95%   96%  ?Weight:  81.6 kg    ?Height:      ? ? ?Intake/Output Summary (Last 24 hours) at 03/08/2022 1002 ?Last data filed at 03/08/2022 0600 ?Gross per 24 hour  ?Intake 344.23 ml  ?Output 3000 ml  ?Net -2655.77 ml  ? ?Filed Weights  ? 03/07/22 0745 03/07/22 1129 03/08/22 0515  ?Weight: 83.6 kg 79.5 kg 81.6 kg  ? ? ?Telemetry  ?  ?Atrial fib with controlled ventricular rate - Personally  Reviewed ? ?ECG  ?  ?NA - Personally Reviewed ? ?Physical Exam  ? ?GEN: No  acute distress.   ?Neck: No  JVD ?Cardiac: Irregular RR, no murmurs, rubs, or gallops.  ?Respiratory: Clear   to auscultation bilaterally. ?GI: Soft, nontender, non-distended, normal bowel sounds  ?MS:  No edema; No deformity. ?Neuro:   Nonfocal  ?Psych: Oriented and appropriate  ? ? ?Labs  ?  ?Chemistry ?Recent Labs  ?Lab 03/06/22 ?0616 03/07/22 ?0112 03/08/22 ?0045  ?NA 132* 131* 130*  ?K 3.7 3.6 4.0  ?CL 93* 93* 95*  ?CO2 24 23 23   ?GLUCOSE 135* 103* 189*  ?BUN 60* 66* 43*  ?CREATININE 9.17* 10.20* 7.64*  ?CALCIUM 9.2 8.9 8.4*  ?ALBUMIN  --  3.1* 2.9*  ?GFRNONAA 4* 4* 5*  ?ANIONGAP 15 15 12   ?  ? ?Hematology ?Recent Labs  ?Lab 03/06/22 ?0616 03/07/22 ?0112 03/08/22 ?0045  ?WBC 13.6* 15.6* 13.7*  ?RBC 3.85* 3.78* 3.62*  ?HGB 10.9* 10.6* 10.2*  ?HCT 32.5* 31.9* 31.4*  ?MCV 84.4 84.4 86.7  ?MCH 28.3 28.0 28.2  ?MCHC 33.5 33.2 32.5  ?RDW 17.3* 16.9* 17.2*  ?  PLT 218 205 164  ? ? ?Cardiac EnzymesNo results for input(s): TROPONINI in the last 168 hours. No results for input(s): TROPIPOC in the last 168 hours.  ? ?BNP ?No results for input(s): BNP, PROBNP in the last 168 hours. ?  ? ?DDimer No results for input(s): DDIMER in the last 168 hours.  ? ?Radiology  ?  ?CARDIAC CATHETERIZATION ? ?Result Date: 03/06/2022 ?Conclusions: Severe two-vessel coronary artery disease with 80-90% mid LAD and 99% proximal RCA stenoses (left-to-right collaterals noted). Patent ramus intermedius stent with minimal in-stent restenosis. Moderately elevated left ventricular filling pressure (LVEDP 30 mmHg). Heavily calcified right external iliac and common femoral arteries with 50% stenosis of the right CFA. Recommendations: Findings discussed with Dr. Percival Spanish.  We have agreed to obtain cardiac surgery consultation for CABG given severe LAD and RCA disease with associated calcification.  If she is not a suitable surgical candidate, PCI to LAD and RCA +/- atherectomy  would need to be considered once volume status and blood pressure have been optimized. Initiate IV nitroglycerin and IV furosemide for antianginal therapy, BP control, and diuresis.  More aggressive fluid removal via hemodialysis should also be considered. Aggressive secondary prevention. Nelva Bush, MD [facility]  ? ?Cardiac Studies  ? ?CARDIAC CATH: ? ?1. Left ventricular ejection fraction, by estimation, is 55%%. The left  ?ventricle has low normal function. The left ventricle has no regional wall  ?motion abnormalities. There is mild left ventricular hypertrophy. Left  ?ventricular diastolic parameters  ?are indeterminate.  ? 2. Compared to prior study, RV dysfunction is new  ?    [name] Right ventricular systolic function moderate to severely decreased.  ?The right ventricular size is mildly enlarged. There is severely elevated  ?pulmonary artery systolic pressure.  ? 3. Left atrial size was mildly dilated.  ? 4. Right atrial size was mildly dilated.  ? 5. A small pericardial effusion is present.  ? 6. Mild mitral valve regurgitation.  ? 7. The aortic valve is tricuspid. Aortic valve regurgitation is not  ?visualized. Aortic valve sclerosis is present, with no evidence of aortic  ?valve stenosis.  ? 8. The inferior vena cava is dilated in size with <50% respiratory  ?variability, suggesting right atrial pressure of 15 mmHg.  ? ?Patient Profile  ?   ?71 y.o. female with CAD (multiple prior cath with RI PCI),  HTN, Acute on Chronic Diastolic Heart Failure, prior CVA, PAD, with COPD who was seen 03/05/2022 for the evaluation of new PAF CHADSVASC 6, with chest pain at HD. ? ?Assessment & Plan  ?  ?PAF:    She will need Eliquis post procedure.   ? ?CHEST PAIN:   On for two vessel PCI today.   ? ?ESRD:  Per primary team and nephrology.  Dialysis again tomorrow.   ? ?PULMONARY HTN:  Medical management with volume managed per dialysis.  ? ?HTN:    I will consolidate meds before discharge and control BP with AV nodal  drugs for rate control.   ? ?For questions or updates, please contact [place] ?Please consult [email or web address] for contact info under Cardiology/STEMI. ?  ?Signed, ?Minus Breeding, MD  ?03/08/2022, 10:02 AM   ? ?

## 2022-03-08 NOTE — Plan of Care (Signed)
?  Problem: Education: ?Goal: Understanding of medication regimen will improve ?Outcome: Progressing ?  ?Problem: Education: ?Goal: Understanding of CV disease, CV risk reduction, and recovery process will improve ?Outcome: Progressing ?  ?Problem: Activity: ?Goal: Ability to tolerate increased activity will improve ?Outcome: Not Progressing ?  ?

## 2022-03-08 NOTE — Progress Notes (Signed)
? ?Progress Note ? ?Patient Name: [name] ?Date of Encounter: 03/08/2022 ? ?Primary Cardiologist:   Minus Breeding, MD ? ? ?Subjective  ? ?She has had some low level chest pain but no SOB.  ? ?Inpatient Medications  ?  ?Scheduled Meds: ? [START ON 03/09/2022] aspirin EC  81 mg Oral Daily  ? atenolol  50 mg Oral BID  ? busPIRone  15 mg Oral BID  ? calcium acetate  1,334 mg Oral TID with meals  ? diltiazem  30 mg Oral Q6H  ? famotidine  20 mg Oral BID  ? [START ON 03/09/2022] gabapentin  400 mg Oral Q T,Th,Sat-1800  ? insulin aspart  0-5 Units Subcutaneous QHS  ? insulin aspart  0-6 Units Subcutaneous TID WC  ? insulin detemir  15 Units Subcutaneous QHS  ? pantoprazole  40 mg Oral Daily  ? QUEtiapine  100 mg Oral QHS  ? rOPINIRole  1 mg Oral QHS  ? rosuvastatin  5 mg Oral Daily  ? sodium chloride flush  3 mL Intravenous Q12H  ? sodium chloride flush  3 mL Intravenous Q12H  ? ticagrelor  180 mg Oral Once  ? ?Continuous Infusions: ? sodium chloride    ? sodium chloride    ? sodium chloride 10 mL/hr at 03/08/22 0700  ? heparin 1,100 Units/hr (03/07/22 2136)  ? nitroGLYCERIN Stopped (03/07/22 0640)  ? ?PRN Meds: ?sodium chloride, sodium chloride, acetaminophen **OR** acetaminophen, calcium acetate, hydrOXYzine, levalbuterol, nitroGLYCERIN, oxyCODONE, promethazine, sodium chloride flush, sodium chloride flush  ? ?Vital Signs  ?  ?Vitals:  ? 03/08/22 4268 03/08/22 0515 03/08/22 3419 03/08/22 0717  ?BP: (!) 154/76  140/76 (!) 150/77  ?Pulse:      ?Resp: 14   11  ?Temp: 97.9 ?F (36.6 ?C)   98 ?F (36.7 ?C)  ?TempSrc: Oral   Oral  ?SpO2: 95%   96%  ?Weight:  81.6 kg    ?Height:      ? ? ?Intake/Output Summary (Last 24 hours) at 03/08/2022 1002 ?Last data filed at 03/08/2022 0600 ?Gross per 24 hour  ?Intake 344.23 ml  ?Output 3000 ml  ?Net -2655.77 ml  ? ?Filed Weights  ? 03/07/22 0745 03/07/22 1129 03/08/22 0515  ?Weight: 83.6 kg 79.5 kg 81.6 kg  ? ? ?Telemetry  ?  ?Atrial fib with controlled ventricular rate - Personally  Reviewed ? ?ECG  ?  ?NA - Personally Reviewed ? ?Physical Exam  ? ?GEN: No  acute distress.   ?Neck: No  JVD ?Cardiac: Irregular RR, no murmurs, rubs, or gallops.  ?Respiratory: Clear   to auscultation bilaterally. ?GI: Soft, nontender, non-distended, normal bowel sounds  ?MS:  No edema; No deformity. ?Neuro:   Nonfocal  ?Psych: Oriented and appropriate  ? ? ?Labs  ?  ?Chemistry ?Recent Labs  ?Lab 03/06/22 ?0616 03/07/22 ?0112 03/08/22 ?0045  ?NA 132* 131* 130*  ?K 3.7 3.6 4.0  ?CL 93* 93* 95*  ?CO2 24 23 23   ?GLUCOSE 135* 103* 189*  ?BUN 60* 66* 43*  ?CREATININE 9.17* 10.20* 7.64*  ?CALCIUM 9.2 8.9 8.4*  ?ALBUMIN  --  3.1* 2.9*  ?GFRNONAA 4* 4* 5*  ?ANIONGAP 15 15 12   ?  ? ?Hematology ?Recent Labs  ?Lab 03/06/22 ?0616 03/07/22 ?0112 03/08/22 ?0045  ?WBC 13.6* 15.6* 13.7*  ?RBC 3.85* 3.78* 3.62*  ?HGB 10.9* 10.6* 10.2*  ?HCT 32.5* 31.9* 31.4*  ?MCV 84.4 84.4 86.7  ?MCH 28.3 28.0 28.2  ?MCHC 33.5 33.2 32.5  ?RDW 17.3* 16.9* 17.2*  ?  PLT 218 205 164  ? ? ?Cardiac EnzymesNo results for input(s): TROPONINI in the last 168 hours. No results for input(s): TROPIPOC in the last 168 hours.  ? ?BNP ?No results for input(s): BNP, PROBNP in the last 168 hours. ?  ? ?DDimer No results for input(s): DDIMER in the last 168 hours.  ? ?Radiology  ?  ?CARDIAC CATHETERIZATION ? ?Result Date: 03/06/2022 ?Conclusions: Severe two-vessel coronary artery disease with 80-90% mid LAD and 99% proximal RCA stenoses (left-to-right collaterals noted). Patent ramus intermedius stent with minimal in-stent restenosis. Moderately elevated left ventricular filling pressure (LVEDP 30 mmHg). Heavily calcified right external iliac and common femoral arteries with 50% stenosis of the right CFA. Recommendations: Findings discussed with Dr. Percival Spanish.  We have agreed to obtain cardiac surgery consultation for CABG given severe LAD and RCA disease with associated calcification.  If she is not a suitable surgical candidate, PCI to LAD and RCA +/- atherectomy  would need to be considered once volume status and blood pressure have been optimized. Initiate IV nitroglycerin and IV furosemide for antianginal therapy, BP control, and diuresis.  More aggressive fluid removal via hemodialysis should also be considered. Aggressive secondary prevention. Nelva Bush, MD [facility]  ? ?Cardiac Studies  ? ?CARDIAC CATH: ? ?1. Left ventricular ejection fraction, by estimation, is 55%%. The left  ?ventricle has low normal function. The left ventricle has no regional wall  ?motion abnormalities. There is mild left ventricular hypertrophy. Left  ?ventricular diastolic parameters  ?are indeterminate.  ? 2. Compared to prior study, RV dysfunction is new  ?    [name] Right ventricular systolic function moderate to severely decreased.  ?The right ventricular size is mildly enlarged. There is severely elevated  ?pulmonary artery systolic pressure.  ? 3. Left atrial size was mildly dilated.  ? 4. Right atrial size was mildly dilated.  ? 5. A small pericardial effusion is present.  ? 6. Mild mitral valve regurgitation.  ? 7. The aortic valve is tricuspid. Aortic valve regurgitation is not  ?visualized. Aortic valve sclerosis is present, with no evidence of aortic  ?valve stenosis.  ? 8. The inferior vena cava is dilated in size with <50% respiratory  ?variability, suggesting right atrial pressure of 15 mmHg.  ? ?Patient Profile  ?   ?71 y.o. female with CAD (multiple prior cath with RI PCI),  HTN, Acute on Chronic Diastolic Heart Failure, prior CVA, PAD, with COPD who was seen 03/05/2022 for the evaluation of new PAF CHADSVASC 6, with chest pain at HD. ? ?Assessment & Plan  ?  ?PAF:    She will need Eliquis post procedure.   ? ?CHEST PAIN:   On for two vessel PCI today.   ? ?ESRD:  Per primary team and nephrology.  Dialysis again tomorrow.   ? ?PULMONARY HTN:  Medical management with volume managed per dialysis.  ? ?HTN:    I will consolidate meds before discharge and control BP with AV nodal  drugs for rate control.   ? ?For questions or updates, please contact [place] ?Please consult [email or web address] for contact info under Cardiology/STEMI. ?  ?Signed, ?Minus Breeding, MD  ?03/08/2022, 10:02 AM   ? ?

## 2022-03-08 NOTE — Interval H&P Note (Signed)
Cath Lab Visit (complete for each Cath Lab visit) ? ?Clinical Evaluation Leading to the Procedure:  ? ?ACS: Yes.   ? ?Non-ACS:   ? ?Anginal Classification: CCS IV ? ?Anti-ischemic medical therapy: Maximal Therapy (2 or more classes of medications) ? ?Non-Invasive Test Results: No non-invasive testing performed ? ?Prior CABG: No previous CABG ? ? ? ? ? ?History and Physical Interval Note: ? ?03/08/2022 ?2:23 PM ? ?[name]  has presented today for surgery, with the diagnosis of cad.  The various methods of treatment have been discussed with the patient and family. After consideration of risks, benefits and other options for treatment, the patient has consented to  Procedure(s): ?CORONARY ATHERECTOMY (N/A) as a surgical intervention.  [name] history has been reviewed, patient examined, no change in status, stable for surgery.  I have reviewed the patient's chart and labs.  Questions were answered to the patient's satisfaction.   ? ? ?[name] ? ? ?

## 2022-03-08 NOTE — Progress Notes (Signed)
ANTICOAGULATION CONSULT NOTE  ? ?Pharmacy Consult for Heparin ?Indication: atrial fibrillation ? ?Allergies  ?Allergen Reactions  ? Azithromycin   ?  Prolonged QT  ? Ace Inhibitors Cough  ? Clopidogrel Bisulfate Other (See Comments)  ?  Sick, Headache, "Felt terrible"  ? Codeine Other (See Comments)  ?  hallucinations  ? Lisinopril Cough  ? Penicillins Other (See Comments)  ?  Unknown reaction ?Did it involve swelling of the face/tongue/throat, SOB, or low BP? Unknown ?Did it involve sudden or severe rash/hives, skin peeling, or any reaction on the inside of your mouth or nose? Unknown ?Did you need to seek medical attention at a hospital or doctor's office? Unknown ?When did it last happen?      childhood allergy ?If all above answers are "NO", may proceed with cephalosporin use. ? ?  ? ? ?Patient Measurements: ?Height: 5\' 3"  (160 cm) ?Weight: 81.6 kg (179 lb 14.3 oz) ?IBW/kg (Calculated) : 52.4 ?Heparin Dosing Weight: 71 kg ? ?Vital Signs: ?Temp: 98 ?F (36.7 ?C) (04/05 1025) ?Temp Source: Oral (04/05 8527) ?BP: 150/77 (04/05 0717) ?Pulse Rate: 74 (04/04 2137) ? ?Labs: ?Recent Labs  ?  03/06/22 ?0616 03/07/22 ?0112 03/07/22 ?0805 03/08/22 ?0045  ?HGB 10.9* 10.6*  --  10.2*  ?HCT 32.5* 31.9*  --  31.4*  ?PLT 218 205  --  164  ?HEPARINUNFRC 0.60 <0.10* 0.34 0.39  ?CREATININE 9.17* 10.20*  --  7.64*  ? ? ? ?Estimated Creatinine Clearance: 7 mL/min (A) (by C-G formula based on SCr of 7.64 mg/dL (H)). ? ?Medications: ? sodium chloride    ? sodium chloride    ? sodium chloride 10 mL/hr at 03/08/22 0700  ? heparin 1,100 Units/hr (03/07/22 2136)  ? nitroGLYCERIN Stopped (03/07/22 0640)  ?  ? ?Assessment: ?71 y.o. F presents with CP. Found to be in afib with RVR. CHADS2VASC score is 6. CBC stable. No AC PTA. Pt is ESRD on HD TTS. ? ?Heparin resumed s/p cath 4/3. Heparin level at goal today. Hg low stable, plt wnl. No bleed issues reported. ? ?Goal of Therapy:  ?Heparin level 0.3-0.7 units/ml ?Monitor platelets by  anticoagulation protocol: Yes ?  ?Plan:  ?Continue IV heparin at 1100 units/hr   ?Monitor daily heparin level and CBC, s/sx bleeding ?Arthrectomy planned for 4/5 ?Cardiology planning apixaban post-procedure per notes ? ?[place] Crane, Pharm D, BCPS, BCCP ?Clinical Pharmacist ? 03/08/2022 8:08 AM  ? ?[facility] pharmacy phone numbers are listed on [email or web address] ? ?

## 2022-03-08 NOTE — Progress Notes (Signed)
Mobility Specialist Progress Note ? ? 03/08/22 1500  ?Mobility  ?Activity Off unit  ? ?Pt currently off unit for procedure and advised by RN that they will be gone for awhile. Will f/u tomorrow.  ? ?Holland Falling ?Mobility Specialist ?Phone Number [phone] ? ?

## 2022-03-09 ENCOUNTER — Encounter (HOSPITAL_COMMUNITY): Payer: Self-pay | Admitting: Cardiovascular Disease

## 2022-03-09 ENCOUNTER — Other Ambulatory Visit (HOSPITAL_COMMUNITY): Payer: Self-pay

## 2022-03-09 DIAGNOSIS — E1169 Type 2 diabetes mellitus with other specified complication: Secondary | ICD-10-CM | POA: Diagnosis not present

## 2022-03-09 DIAGNOSIS — I4891 Unspecified atrial fibrillation: Secondary | ICD-10-CM | POA: Diagnosis not present

## 2022-03-09 DIAGNOSIS — E871 Hypo-osmolality and hyponatremia: Secondary | ICD-10-CM

## 2022-03-09 DIAGNOSIS — K219 Gastro-esophageal reflux disease without esophagitis: Secondary | ICD-10-CM | POA: Diagnosis not present

## 2022-03-09 DIAGNOSIS — I200 Unstable angina: Secondary | ICD-10-CM

## 2022-03-09 DIAGNOSIS — R5381 Other malaise: Secondary | ICD-10-CM

## 2022-03-09 DIAGNOSIS — E1122 Type 2 diabetes mellitus with diabetic chronic kidney disease: Secondary | ICD-10-CM | POA: Diagnosis not present

## 2022-03-09 LAB — BASIC METABOLIC PANEL
Anion gap: 13 (ref 5–15)
BUN: 18 mg/dL (ref 8–23)
CO2: 23 mmol/L (ref 22–32)
Calcium: 8.8 mg/dL — ABNORMAL LOW (ref 8.9–10.3)
Chloride: 97 mmol/L — ABNORMAL LOW (ref 98–111)
Creatinine, Ser: 5.3 mg/dL — ABNORMAL HIGH (ref 0.44–1.00)
GFR, Estimated: 8 mL/min — ABNORMAL LOW (ref >60)
Glucose, Bld: 211 mg/dL — ABNORMAL HIGH (ref 70–99)
Potassium: 4.2 mmol/L (ref 3.5–5.1)
Sodium: 133 mmol/L — ABNORMAL LOW (ref 135–145)

## 2022-03-09 LAB — COMPREHENSIVE METABOLIC PANEL
ALT: 17 U/L (ref 0–44)
AST: 12 U/L — ABNORMAL LOW (ref 15–41)
Albumin: 3.2 g/dL — ABNORMAL LOW (ref 3.5–5.0)
Alkaline Phosphatase: 47 U/L (ref 38–126)
Anion gap: 16 — ABNORMAL HIGH (ref 5–15)
BUN: 52 mg/dL — ABNORMAL HIGH (ref 8–23)
CO2: 19 mmol/L — ABNORMAL LOW (ref 22–32)
Calcium: 8.6 mg/dL — ABNORMAL LOW (ref 8.9–10.3)
Chloride: 93 mmol/L — ABNORMAL LOW (ref 98–111)
Creatinine, Ser: 9.7 mg/dL — ABNORMAL HIGH (ref 0.44–1.00)
GFR, Estimated: 4 mL/min — ABNORMAL LOW (ref >60)
Glucose, Bld: 136 mg/dL — ABNORMAL HIGH (ref 70–99)
Potassium: 4.5 mmol/L (ref 3.5–5.1)
Sodium: 128 mmol/L — ABNORMAL LOW (ref 135–145)
Total Bilirubin: 0.7 mg/dL (ref 0.3–1.2)
Total Protein: 5.6 g/dL — ABNORMAL LOW (ref 6.5–8.1)

## 2022-03-09 LAB — CBC
HCT: 34.2 % — ABNORMAL LOW (ref 36.0–46.0)
HCT: 34.3 % — ABNORMAL LOW (ref 36.0–46.0)
Hemoglobin: 11.2 g/dL — ABNORMAL LOW (ref 12.0–15.0)
Hemoglobin: 11.3 g/dL — ABNORMAL LOW (ref 12.0–15.0)
MCH: 28.2 pg (ref 26.0–34.0)
MCH: 28.5 pg (ref 26.0–34.0)
MCHC: 32.7 g/dL (ref 30.0–36.0)
MCHC: 32.9 g/dL (ref 30.0–36.0)
MCV: 86.1 fL (ref 80.0–100.0)
MCV: 86.4 fL (ref 80.0–100.0)
Platelets: 193 10*3/uL (ref 150–400)
Platelets: 193 10*3/uL (ref 150–400)
RBC: 3.97 MIL/uL (ref 3.87–5.11)
RBC: 3.97 MIL/uL (ref 3.87–5.11)
RDW: 17.4 % — ABNORMAL HIGH (ref 11.5–15.5)
RDW: 17.5 % — ABNORMAL HIGH (ref 11.5–15.5)
WBC: 16.8 10*3/uL — ABNORMAL HIGH (ref 4.0–10.5)
WBC: 13.6 10*3/uL — ABNORMAL HIGH (ref 4.0–10.5)
nRBC: 0 % (ref 0.0–0.2)
nRBC: 0 % (ref 0.0–0.2)

## 2022-03-09 LAB — MAGNESIUM: Magnesium: 2.1 mg/dL (ref 1.7–2.4)

## 2022-03-09 LAB — GLUCOSE, CAPILLARY
Glucose-Capillary: 116 mg/dL — ABNORMAL HIGH (ref 70–99)
Glucose-Capillary: 76 mg/dL (ref 70–99)
Glucose-Capillary: 244 mg/dL — ABNORMAL HIGH (ref 70–99)
Glucose-Capillary: 189 mg/dL — ABNORMAL HIGH (ref 70–99)

## 2022-03-09 MED ORDER — APIXABAN 5 MG PO TABS
5.0000 mg | ORAL_TABLET | Freq: Two times a day (BID) | ORAL | Status: DC
  Administered 2022-03-09 – 2022-03-10 (×4): 5 mg via ORAL
  Filled 2022-03-09 (×5): qty 1

## 2022-03-09 MED ORDER — TORSEMIDE 20 MG PO TABS
20.0000 mg | ORAL_TABLET | Freq: Every day | ORAL | Status: DC
  Administered 2022-03-09 – 2022-03-15 (×7): 20 mg via ORAL
  Filled 2022-03-09 (×7): qty 1

## 2022-03-09 MED ORDER — DILTIAZEM HCL ER COATED BEADS 180 MG PO CP24
180.0000 mg | ORAL_CAPSULE | Freq: Every day | ORAL | Status: DC
  Administered 2022-03-09 – 2022-03-10 (×2): 180 mg via ORAL
  Filled 2022-03-09 (×2): qty 1

## 2022-03-09 NOTE — Progress Notes (Signed)
CARDIAC REHAB PHASE I  ? ?Stent education completed with pt. Pt educated on importance of Eliquis and Brilinta. Reviewed signs and symptoms of bleeding. Pt states some bleeding since hospital stay but does not know where it is coming from. Pt given diabetic diet. Reviewed site care, restrictions, and exercise guidelines. Will refer to CRP II [place]. Pt very tired after dialysis, will f/u tomorrow to encourage ambulation. ? ?[phone] ?Rufina Falco, RN BSN ?03/09/2022 ?2:40 PM ? ?

## 2022-03-09 NOTE — Plan of Care (Signed)
?  Problem: Education: ?Goal: Understanding of medication regimen will improve ?Outcome: Progressing ?  ?Problem: Cardiac: ?Goal: Ability to achieve and maintain adequate cardiopulmonary perfusion will improve ?Outcome: Progressing ?  ?Problem: Education: ?Goal: Understanding of CV disease, CV risk reduction, and recovery process will improve ?Outcome: Progressing ?  ?Problem: Cardiovascular: ?Goal: Ability to achieve and maintain adequate cardiovascular perfusion will improve ?Outcome: Progressing ?Goal: Vascular access site(s) Level 0-1 will be maintained ?Outcome: Progressing ?  ?

## 2022-03-09 NOTE — Progress Notes (Signed)
PT Cancellation Note ? ?Patient Details ?Name: [name] ?MRN: [id number] ?DOB: [birth year] ? ? ?Cancelled Treatment:    Reason Eval/Treat Not Completed: Patient at procedure or test/unavailable (HD) ? ? ?[name] B [name] ?03/09/2022, 8:25 AM ?[name], PT ?Acute Rehabilitation Services ?Pager: [phone] ?Office: [phone] ? ? ?

## 2022-03-09 NOTE — Discharge Instructions (Signed)

## 2022-03-09 NOTE — Progress Notes (Signed)
Mobility Specialist Progress Note ? ? 03/09/22 1620  ?Oxygen Therapy  ?SpO2 96 %  ?O2 Device Nasal Cannula  ?O2 Flow Rate (L/min) 3 L/min  ?Mobility  ?Activity Ambulated with assistance in hallway  ?Level of Assistance Standby assist, set-up cues, supervision of patient - no hands on  ?Assistive Device Front wheel walker  ?Distance Ambulated (ft) 200 ft  ?Activity Response Tolerated well  ?$Mobility charge 1 Mobility  ? ?Pre Mobility: 97 HR, SpO2 98% 2.5LO2 ?During Mobility: 110 HR, BP, 85% SpO2 on 3LO2 ?Post Mobility: 92 HR,  SpO2 on 3LO2 ? ?Received pt on EOB c/o having difficulty breathing when laying down, resulted in them sitting up for most of afternoon. Pt agreeable to mobility and able to ambulate w/ no physical assistance throughout. SpO2 hard to retrieve w/ pt's fake nails, portable pulse ox read 85% during ambulation. [facility] where pt stated they were going to be awhile. Notified NT about pt's position.  ? ?Holland Falling ?Mobility Specialist ?Phone Number [phone] ? ?

## 2022-03-09 NOTE — Progress Notes (Signed)
? ?Progress Note ? ?Patient Name: [name] ?Date of Encounter: 03/09/2022 ? ?Primary Cardiologist:   Minus Breeding, MD ? ? ?Subjective  ? ?In dialysis with BP dropping but no acute complaints. Mild vague chest pain.  Mild dyspnea.   ? ?Inpatient Medications  ?  ?Scheduled Meds: ? aspirin EC  81 mg Oral Daily  ? atenolol  50 mg Oral BID  ? busPIRone  15 mg Oral BID  ? calcium acetate  1,334 mg Oral TID with meals  ? diltiazem  60 mg Oral Q6H  ? famotidine  20 mg Oral QHS  ? gabapentin  400 mg Oral Q T,Th,Sat-1800  ? insulin aspart  0-5 Units Subcutaneous QHS  ? insulin aspart  0-6 Units Subcutaneous TID WC  ? insulin detemir  15 Units Subcutaneous QHS  ? pantoprazole  40 mg Oral Daily  ? QUEtiapine  100 mg Oral QHS  ? rOPINIRole  1 mg Oral QHS  ? rosuvastatin  5 mg Oral Daily  ? sodium chloride flush  3 mL Intravenous Q12H  ? sodium chloride flush  3 mL Intravenous Q12H  ? ticagrelor  90 mg Oral BID  ? ?Continuous Infusions: ? sodium chloride    ? ?PRN Meds: ?sodium chloride, acetaminophen **OR** acetaminophen, calcium acetate, hydrOXYzine, levalbuterol, morphine (PF), nitroGLYCERIN, oxyCODONE, promethazine, sodium chloride flush  ? ?Vital Signs  ?  ?Vitals:  ? 03/08/22 2313 03/09/22 0016 03/09/22 0400 03/09/22 0552  ?BP: (!) 154/79 (!) 161/74 (!) 153/83 (!) 147/93  ?Pulse:   (!) 107   ?Resp: 15  17   ?Temp: 98 ?F (36.7 ?C)  98.2 ?F (36.8 ?C)   ?TempSrc: Oral  Oral   ?SpO2: 94%  98%   ?Weight:      ?Height:      ? ? ?Intake/Output Summary (Last 24 hours) at 03/09/2022 0723 ?Last data filed at 03/09/2022 0600 ?Gross per 24 hour  ?Intake 0 ml  ?Output --  ?Net 0 ml  ? ?Filed Weights  ? 03/07/22 0745 03/07/22 1129 03/08/22 0515  ?Weight: 83.6 kg 79.5 kg 81.6 kg  ? ? ?Telemetry  ?  ?Atrial fib with controlled rate - Personally Reviewed ? ?ECG  ?  ?NA - Personally Reviewed ? ?Physical Exam  ? ? ?BP (!) 94/51   Pulse 67   Temp 98.2 ?F (36.8 ?C) (Oral)   Resp 17   Ht 5\' 3"  (1.6 m)   Wt 81.6 kg   SpO2 100%   BMI  31.87 kg/m?  ?GEN: No  acute distress.   ?Neck: No  JVD ?Cardiac: RRR, no murmurs, rubs, or gallops.  ?Respiratory: Clear   to auscultation bilaterally. ?GI: Soft, nontender, non-distended, normal bowel sounds  ?MS:  No edema; No deformity. ?Neuro:   Nonfocal  ?Psych: Oriented and appropriate  ? ? ?Labs  ?  ?Chemistry ?Recent Labs  ?Lab 03/07/22 ?0112 03/08/22 ?0045 03/09/22 ?0106  ?NA 131* 130* 128*  ?K 3.6 4.0 4.5  ?CL 93* 95* 93*  ?CO2 23 23 19*  ?GLUCOSE 103* 189* 136*  ?BUN 66* 43* 52*  ?CREATININE 10.20* 7.64* 9.70*  ?CALCIUM 8.9 8.4* 8.6*  ?PROT  --   --  5.6*  ?ALBUMIN 3.1* 2.9* 3.2*  ?AST  --   --  12*  ?ALT  --   --  17  ?ALKPHOS  --   --  47  ?BILITOT  --   --  0.7  ?GFRNONAA 4* 5* 4*  ?ANIONGAP 15 12 16*  ?  ? ?  Hematology ?Recent Labs  ?Lab 03/07/22 ?0112 03/08/22 ?0045 03/09/22 ?0106  ?WBC 15.6* 13.7* 16.8*  ?RBC 3.78* 3.62* 3.97  ?HGB 10.6* 10.2* 11.2*  ?HCT 31.9* 31.4* 34.2*  ?MCV 84.4 86.7 86.1  ?MCH 28.0 28.2 28.2  ?MCHC 33.2 32.5 32.7  ?RDW 16.9* 17.2* 17.4*  ?PLT 205 164 193  ? ? ?Cardiac EnzymesNo results for input(s): TROPONINI in the last 168 hours. No results for input(s): TROPIPOC in the last 168 hours.  ? ?BNP ?No results for input(s): BNP, PROBNP in the last 168 hours. ?  ? ?DDimer No results for input(s): DDIMER in the last 168 hours.  ? ?Radiology  ?  ?CARDIAC CATHETERIZATION ? ?Result Date: 03/08/2022 ?1.  Successful atherectomy, PTCA, and stenting of severe stenosis in the mid LAD, reducing 90% calcific stenosis to 0%, TIMI-3 flow pre and post, with a 3.0 x 18 mm Onyx frontier DES 2.  Successful atherectomy, PTCA, and stenting of severe stenosis in the mid RCA, reducing 95% calcific stenosis to 0%, TIMI-3 flow pre and post, with a 3.0 x 34 mm Onyx frontier DES Recommendations: Patient with atrial fibrillation, significant comorbidities of end-stage renal disease.  Considering balance of antithrombotic efficacy and concern over bleeding risk with multidrug therapy, favor the following  approach: Resume apixaban tomorrow at normal dosing schedule, no heparin overnight Ticagrelor 90 mg twice daily x3 months Aspirin 81 mg daily long-term after 3 months of ticagrelor completed   ? ?Cardiac Studies  ? ?Diagnostic ?Dominance: Right ?Intervention ? ? ? ? ? ?Patient Profile  ?   ?71 y.o. female with CAD (multiple prior cath with RI PCI),  HTN, Acute on Chronic Diastolic Heart Failure, prior CVA, PAD, with COPD who was seen 03/05/2022 for the evaluation of new PAF CHADSVASC 6, with chest pain at HD. ? ?Assessment & Plan  ?  ?PAF:    Start Eliquis.  Fib has been persistent.  I will consider cardioversion in the future.  For now rate control and anticoagulation.   ? ?UNSTABLE ANGINA:    Status post two vessel PCI as above.  Plan is for Brilinta 90 mg bid x 3 months.  This will be in conjunction with Eliquis above.  After 3 months of Brilinta will plan ASA and Eliquis.  Patient is unable to take Plavix and we are trying to avoid triple therapy or prolonged Brilinta because of higher bleeding risk.   ? ?ESRD:    Dialysis today.    She was on Torsemide previously and I will resume this.   ? ?PULMONARY HTN:  I will follow this clinically.  ? ?HTN:    She has some hypotension during dialysis so we will need to have the nephrologists help to suggest which BP meds she needs to hold on dialysis days or take after dialysis.    She will not be on Verapamil which was listed previously.  I will suggest holding PM dose of Atenolol before dialysis and AM the day of and delay Cardizem CD dosing until after dialysis on dialysis days.  She might need short acting dialysis if this does not work.  ? ?DM:  Resume Semaglutide per primary team.  ? ?For questions or updates, please contact [place] ?Please consult [email or web address] for contact info under Cardiology/STEMI. ?  ?Signed, ?Minus Breeding, MD  ?03/09/2022, 7:23 AM   ? ?

## 2022-03-09 NOTE — Progress Notes (Addendum)
ANTICOAGULATION CONSULT NOTE  ? ?Pharmacy Consult for Heparin > switch to Eliquis ?Indication: atrial fibrillation ? ?Allergies  ?Allergen Reactions  ? Azithromycin   ?  Prolonged QT  ? Ace Inhibitors Cough  ? Clopidogrel Bisulfate Other (See Comments)  ?  Sick, Headache, "Felt terrible"  ? Codeine Other (See Comments)  ?  hallucinations  ? Lisinopril Cough  ? Penicillins Other (See Comments)  ?  Unknown reaction ?Did it involve swelling of the face/tongue/throat, SOB, or low BP? Unknown ?Did it involve sudden or severe rash/hives, skin peeling, or any reaction on the inside of your mouth or nose? Unknown ?Did you need to seek medical attention at a hospital or doctor's office? Unknown ?When did it last happen?      childhood allergy ?If all above answers are "NO", may proceed with cephalosporin use. ? ?  ? ? ?Patient Measurements: ?Height: 5\' 3"  (160 cm) ?Weight: 81.6 kg (179 lb 14.3 oz) ?IBW/kg (Calculated) : 52.4 ?Heparin Dosing Weight: 71 kg ? ?Vital Signs: ?Temp: 98.2 ?F (36.8 ?C) (04/06 0400) ?Temp Source: Oral (04/06 0400) ?BP: 147/93 (04/06 0552) ?Pulse Rate: 107 (04/06 0400) ? ?Labs: ?Recent Labs  ?  03/07/22 ?0112 03/07/22 ?0805 03/08/22 ?0045 03/09/22 ?0106  ?HGB 10.6*  --  10.2* 11.2*  ?HCT 31.9*  --  31.4* 34.2*  ?PLT 205  --  164 193  ?HEPARINUNFRC <0.10* 0.34 0.39  --   ?CREATININE 10.20*  --  7.64* 9.70*  ? ? ? ?Estimated Creatinine Clearance: 5.5 mL/min (A) (by C-G formula based on SCr of 9.7 mg/dL (H)). ? ?Medications: ? sodium chloride    ?  ? ?Assessment: ?71 y.o. F presents with CP. Found to be in afib with RVR. CHADS2VASC score is 6. CBC stable. No AC PTA. Pt is ESRD on HD TTS. ? ?S/p atherectomy/PTCA yesterday.  Pharmacy asked to start Eliquis today for afib. ? ?Goal of Therapy:  ?Monitor platelets by anticoagulation protocol: Yes ?  ?Plan:  ?Eliquis 5 mg po BID. ?Will educate patient about Eliquis prior to discharge. ? ?[place] Crane, Pharm D, BCPS, BCCP ?Clinical Pharmacist ? 03/09/2022 7:38  AM  ? ?[facility] pharmacy phone numbers are listed on [email or web address] ? ? ?

## 2022-03-09 NOTE — Progress Notes (Addendum)
?PROGRESS NOTE ? ? ? ?[name]  ZOX:[id number] DOB: 1950/12/25 DOA: 03/04/2022 ?PCP: [name], MD  ? ? ?Brief Narrative:  ?Patient is a 71 years old female with history of COPD, hypertension, diabetes mellitus, end-stage renal disease on hemodialysis presented to hospital with chest discomfort and new onset atrial fibrillation.  In the ED, patient was noted to have elevated troponin.  Patient underwent cardiac catheterization on 03/06/2022 which showed severe two-vessel disease involving RCA and LAD.  Patient was thought to be high risk for CT surgery so  underwent PCI on 03/08/2022. Nephrology following for dialysis needs  ?  ?Assessment and Plan: ? ?Principal Problem: ?  Atrial fibrillation with RVR ([place]) ?Active Problems: ?  Unstable angina ([place]) ?  End stage renal disease on dialysis due to type 2 diabetes mellitus ([place]) ?  Pulmonary hypertension ([place]) ?  Type II diabetes mellitus with nephropathy ([place]) ?  Hypertension associated with diabetes ([place]) ?  Hyperlipidemia associated with type 2 diabetes mellitus ([place]) ?  Obesity (BMI 30.0-34.9) ?  Leukocytosis ?  GERD (gastroesophageal reflux disease) ?  Hypokalemia ?  Physical deconditioning ?  Hyponatremia ?  ?* Atrial fibrillation with RVR ([place]) ?Rate controlled at this time.  Currently on aspirin atenolol and Eliquis,  Long-acting Cardizem .  2D echocardiogram this time showed normal EF but severe pulmonary hypertension ?TSH 4.7 ? ?Unstable angina ?Patient underwent cardiac catheterization on 03/06/2022 with  severe two-vessel disease involving LAD and RCA.  Patient subsequently underwent cardiac catheterization and coronary atherectomy, PCI and stenting of the severe stenosis in the mid LAD, RCA on 03/08/2022.  Continue aspirin, Brilinta, beta-blocker, statins, Eliquis..  Patient was thought to be high risk for surgery as per cardiothoracic surgery.  Plan is to continue Brilinta for 90 days followed by aspirin and Eliquis alone. ? ?End stage renal disease on  dialysis due to type 2 diabetes mellitus ([place]) ?Nephrology on board for hemodialysis need.  Tuesday Thursday Saturday schedule at baseline.  Patient was hypotensive during hemodialysis today.  Might need dialysis/medication adjustment. ? ?Pulmonary hypertension ([place]) ?Does have history of COPD likely causing pulmonary hypertension.  RV systolic pressure was 61.   ? ?Type II diabetes mellitus with nephropathy ([place]) ?Patient uses 25 units of basal insulin at home has been reduced to 15 units sliding scale while in the hospital.  Continue gabapentin for neuropathy.  POC glucose of 116. ? ?Hypertension associated with diabetes ([place]) ?Continue atenolol, long-acting Cardizem.  Hypotensive during hemodialysis.  Cardiology suggesting holding atenolol on the morning of hemodialysis and previous evening and using Cardizem in the evening postdialysis for ? ?Hypokalemia ?Replenished and adjusted during hemodialysis.  Latest potassium of 4.5. ? ?Hyponatremia.  On hemodialysis.  Has been started on torsemide as well.  We will monitor BMP closely. ? ?GERD (gastroesophageal reflux disease) ?Continue Pepcid and Protonix. ? ?Leukocytosis ?Likely reactive.  Latest WBC at 16.8.  Afebrile.  No signs of infection at this time.  Chest x-ray without any acute infiltrate. ? ?Obesity (BMI 30.0-34.9) ?BMI 31.05.  Would benefit from weight loss as outpatient. ? ?Hyperlipidemia associated with type 2 diabetes mellitus ([place]) ?Continue statin ? ?Weakness and deconditioning.  Physical therapy has seen the patient and will consider home health on discharge. ? ? ? DVT prophylaxis: SCDs Start: 03/05/22 0239 ?apixaban (ELIQUIS) tablet 5 mg  ? ?Code Status:   ?  Code Status: Full Code ? ?Disposition: Home likely in 1 to 2 days when okay with cardiology nephrology,  ? ?Status is: Inpatient ? ?  Remains inpatient appropriate because: Status post PCI, hypotension during hemodialysis, medication adjustment, ? ? ? Family Communication:  ?Spoke with the  patient at bedside. ? ?Consultants:  ?Cardiology ?Cardiothoracic surgery ?Nephrology ? ?Procedures:  ?Cardiac catheterization on 03/06/2022  ?Cardiac catheterization on 03/08/2022 with coronary atherectomy, PCI to mid LAD and RCA ? ?Antimicrobials:  ?None ? ?Anti-infectives (From admission, onward)  ? ? None  ? ?  ? ?Subjective: ?Today, patient was seen and examined at bedside.  Seen during hemodialysis.  Feels extremely fatigued weak and tired states that she cannot even open her eyes.  Feels washed out.  Blood pressure was noted to be low during hemodialysis.  Complained of mild chest discomfort but nothing like yesterday.  No shortness of breath.  ? ?Objective: ?Vitals:  ? 03/09/22 1000 03/09/22 1030 03/09/22 1100 03/09/22 1127  ?BP: (!) 121/51 (!) 124/56 (!) 134/56 (!) 146/56  ?Pulse: 73 63 63 78  ?Resp: 11 12 13 17   ?Temp:      ?TempSrc:      ?SpO2:    100%  ?Weight:    85 kg  ?Height:      ? ? ?Intake/Output Summary (Last 24 hours) at 03/09/2022 1148 ?Last data filed at 03/09/2022 1127 ?Gross per 24 hour  ?Intake 0 ml  ?Output 500 ml  ?Net -500 ml  ? ?Filed Weights  ? 03/08/22 0515 03/09/22 0742 03/09/22 1127  ?Weight: 81.6 kg 81.6 kg 85 kg  ? ? ?Physical Examination: ?Body mass index is 33.19 kg/m?.  ? ?General: Obese built, not in obvious distress, on nasal cannula oxygen ?HENT:   No scleral pallor or icterus noted. Oral mucosa is moist.  ?Chest:  Clear breath sounds.  Diminished breath sounds bilaterally.  ?CVS: S1 &S2 heard. No murmur.  Irregular rhythm ?Abdomen: Soft, nontender, nondistended.  Bowel sounds are heard.   ?Extremities: No cyanosis, clubbing or edema.  Peripheral pulses are palpable. ?Psych: Alert, awake and oriented, normal mood ?CNS:  No cranial nerve deficits.  Power equal in all extremities.   ?Skin: Warm and dry.  No rashes noted. ? ?Data Reviewed:  ? ?CBC: ?Recent Labs  ?Lab 03/05/22 ?0740 03/06/22 ?0616 03/07/22 ?0112 03/08/22 ?0045 03/09/22 ?0106  ?WBC 13.7* 13.6* 15.6* 13.7* 16.8*   ?NEUTROABS 11.3*  --   --   --   --   ?HGB 11.6* 10.9* 10.6* 10.2* 11.2*  ?HCT 34.7* 32.5* 31.9* 31.4* 34.2*  ?MCV 84.2 84.4 84.4 86.7 86.1  ?PLT 223 218 205 164 193  ? ? ?Basic Metabolic Panel: ?Recent Labs  ?Lab 03/04/22 ?2041 03/06/22 ?6384 03/07/22 ?0112 03/08/22 ?0045 03/09/22 ?0106  ?NA 133* 132* 131* 130* 128*  ?K 3.3* 3.7 3.6 4.0 4.5  ?CL 92* 93* 93* 95* 93*  ?CO2 27 24 23 23  19*  ?GLUCOSE 274* 135* 103* 189* 136*  ?BUN 34* 60* 66* 43* 52*  ?CREATININE 6.09* 9.17* 10.20* 7.64* 9.70*  ?CALCIUM 8.7* 9.2 8.9 8.4* 8.6*  ?MG  --   --   --   --  2.1  ?PHOS  --   --  8.5* 5.7*  --   ? ? ?Liver Function Tests: ?Recent Labs  ?Lab 03/07/22 ?0112 03/08/22 ?0045 03/09/22 ?0106  ?AST  --   --  12*  ?ALT  --   --  17  ?ALKPHOS  --   --  47  ?BILITOT  --   --  0.7  ?PROT  --   --  5.6*  ?ALBUMIN 3.1* 2.9* 3.2*  ? ? ? ?  Radiology Studies: ?CARDIAC CATHETERIZATION ? ?Result Date: 03/08/2022 ?1.  Successful atherectomy, PTCA, and stenting of severe stenosis in the mid LAD, reducing 90% calcific stenosis to 0%, TIMI-3 flow pre and post, with a 3.0 x 18 mm Onyx frontier DES 2.  Successful atherectomy, PTCA, and stenting of severe stenosis in the mid RCA, reducing 95% calcific stenosis to 0%, TIMI-3 flow pre and post, with a 3.0 x 34 mm Onyx frontier DES Recommendations: Patient with atrial fibrillation, significant comorbidities of end-stage renal disease.  Considering balance of antithrombotic efficacy and concern over bleeding risk with multidrug therapy, favor the following approach: Resume apixaban tomorrow at normal dosing schedule, no heparin overnight Ticagrelor 90 mg twice daily x3 months Aspirin 81 mg daily long-term after 3 months of ticagrelor completed   ? ? ? LOS: 5 days  ? ? ?Flora Lipps, MD ?[facility] ?03/09/2022, 11:48 AM  ? ? ?

## 2022-03-09 NOTE — Progress Notes (Signed)
[facility] ?Progress Note ? ?Subjective: had LHC yesterday w/ atherectomy and stenting of the RCA and LAD lesions. Pt stable this am. Says she had bleeding from her AVF overnight but controlled now.  ? ?Vitals:  ? 03/08/22 2313 03/09/22 0016 03/09/22 0400 03/09/22 0552  ?BP: (!) 154/79 (!) 161/74 (!) 153/83 (!) 147/93  ?Pulse:   (!) 107   ?Resp: 15  17   ?Temp: 98 ?F (36.7 ?C)  98.2 ?F (36.8 ?C)   ?TempSrc: Oral  Oral   ?SpO2: 94%  98%   ?Weight:      ?Height:      ? ? ?Exam: ? Gen alert, no distress, 2L O2 [place] ?No jvd or bruits ?Chest clear bilat to bases ?RRR no RG ?Abd soft ntnd no mass or ascites +bs ?Ext no LE edema ?Neuro is alert, Ox 3 , nf ? RUA aVF+bruit ?  ? ?  4/1 CXR - no acute disease ? ? ? OP HD: [facility] ? - 84kg dry wt (?)  RUA AVF ? - 4/04 > hep B Ag neg, and hep B Abs were low/ not protective ? ? ?Assessment/ Plan: ?Chest pain: presented in a.fib with RVR. On diltiazem. Rate controlled at present. Management per primary/cardiology. SP LHC which showed severe 2V CAD. Seen by TCTS, too high risk for surgery. Had atherectomy/ PCI to LAD/ RCA lesions yest 4/05. Stable today.  ? ESRD:  on HD TTS. Plan HD today.  ?Hypokalemia: K 4.0 today. 4K bath next HD.  ? Hypertension/volume: BP moderately elevated. On diltiazem and atenolol. Patient reports UF goals around 3L q HD. LVEDP from cath 4/03 was high at 30 mmHg. CXR 4/01 negative. 3kg below edw here. Cont to lower volume w/ HD as tolerated.  ? Anemia: Hgb at goal. Will request outpatient ESA records.  ? Metabolic bone disease: Calcium controlled. Phos down. Continue calcium acetate.  ? ? ? ? ?[name] ?03/09/2022, 7:57 AM ? ? ?Recent Labs  ?Lab 03/07/22 ?0112 03/08/22 ?0045 03/09/22 ?0106  ?HGB 10.6* 10.2* 11.2*  ?ALBUMIN 3.1* 2.9* 3.2*  ?CALCIUM 8.9 8.4* 8.6*  ?PHOS 8.5* 5.7*  --   ?CREATININE 10.20* 7.64* 9.70*  ?K 3.6 4.0 4.5  ? ? ?Inpatient medications: ? apixaban  5 mg Oral BID  ? aspirin EC  81 mg Oral Daily  ? atenolol  50 mg Oral  BID  ? busPIRone  15 mg Oral BID  ? calcium acetate  1,334 mg Oral TID with meals  ? diltiazem  180 mg Oral Daily  ? famotidine  20 mg Oral QHS  ? gabapentin  400 mg Oral Q T,Th,Sat-1800  ? insulin aspart  0-5 Units Subcutaneous QHS  ? insulin aspart  0-6 Units Subcutaneous TID WC  ? insulin detemir  15 Units Subcutaneous QHS  ? pantoprazole  40 mg Oral Daily  ? QUEtiapine  100 mg Oral QHS  ? rOPINIRole  1 mg Oral QHS  ? rosuvastatin  5 mg Oral Daily  ? sodium chloride flush  3 mL Intravenous Q12H  ? sodium chloride flush  3 mL Intravenous Q12H  ? ticagrelor  90 mg Oral BID  ? torsemide  20 mg Oral Daily  ? ? sodium chloride    ? ?sodium chloride, acetaminophen **OR** acetaminophen, calcium acetate, hydrOXYzine, levalbuterol, morphine (PF), nitroGLYCERIN, oxyCODONE, promethazine, sodium chloride flush ? ? ? ? ? ? ? ?

## 2022-03-10 ENCOUNTER — Other Ambulatory Visit (HOSPITAL_COMMUNITY): Payer: Self-pay

## 2022-03-10 DIAGNOSIS — I4891 Unspecified atrial fibrillation: Secondary | ICD-10-CM | POA: Diagnosis not present

## 2022-03-10 DIAGNOSIS — K219 Gastro-esophageal reflux disease without esophagitis: Secondary | ICD-10-CM | POA: Diagnosis not present

## 2022-03-10 DIAGNOSIS — E1169 Type 2 diabetes mellitus with other specified complication: Secondary | ICD-10-CM | POA: Diagnosis not present

## 2022-03-10 DIAGNOSIS — E1122 Type 2 diabetes mellitus with diabetic chronic kidney disease: Secondary | ICD-10-CM | POA: Diagnosis not present

## 2022-03-10 LAB — CBC
HCT: 29.7 % — ABNORMAL LOW (ref 36.0–46.0)
Hemoglobin: 9.6 g/dL — ABNORMAL LOW (ref 12.0–15.0)
MCH: 28.2 pg (ref 26.0–34.0)
MCHC: 32.3 g/dL (ref 30.0–36.0)
MCV: 87.1 fL (ref 80.0–100.0)
Platelets: 189 10*3/uL (ref 150–400)
RBC: 3.41 MIL/uL — ABNORMAL LOW (ref 3.87–5.11)
RDW: 17.6 % — ABNORMAL HIGH (ref 11.5–15.5)
WBC: 12.7 10*3/uL — ABNORMAL HIGH (ref 4.0–10.5)
nRBC: 0 % (ref 0.0–0.2)

## 2022-03-10 LAB — RENAL FUNCTION PANEL
Albumin: 3 g/dL — ABNORMAL LOW (ref 3.5–5.0)
Anion gap: 11 (ref 5–15)
BUN: 29 mg/dL — ABNORMAL HIGH (ref 8–23)
CO2: 25 mmol/L (ref 22–32)
Calcium: 9.1 mg/dL (ref 8.9–10.3)
Chloride: 93 mmol/L — ABNORMAL LOW (ref 98–111)
Creatinine, Ser: 8.08 mg/dL — ABNORMAL HIGH (ref 0.44–1.00)
GFR, Estimated: 5 mL/min — ABNORMAL LOW (ref >60)
Glucose, Bld: 209 mg/dL — ABNORMAL HIGH (ref 70–99)
Phosphorus: 5.3 mg/dL — ABNORMAL HIGH (ref 2.5–4.6)
Potassium: 4.8 mmol/L (ref 3.5–5.1)
Sodium: 129 mmol/L — ABNORMAL LOW (ref 135–145)

## 2022-03-10 LAB — GLUCOSE, CAPILLARY
Glucose-Capillary: 178 mg/dL — ABNORMAL HIGH (ref 70–99)
Glucose-Capillary: 219 mg/dL — ABNORMAL HIGH (ref 70–99)
Glucose-Capillary: 251 mg/dL — ABNORMAL HIGH (ref 70–99)

## 2022-03-10 LAB — MAGNESIUM: Magnesium: 2.1 mg/dL (ref 1.7–2.4)

## 2022-03-10 LAB — PHOSPHORUS: Phosphorus: 4.6 mg/dL (ref 2.5–4.6)

## 2022-03-10 MED ORDER — ATENOLOL 50 MG PO TABS
50.0000 mg | ORAL_TABLET | ORAL | Status: DC
  Administered 2022-03-11 – 2022-03-15 (×3): 50 mg via ORAL
  Filled 2022-03-10 (×3): qty 1

## 2022-03-10 MED ORDER — POLYETHYLENE GLYCOL 3350 17 G PO PACK
17.0000 g | PACK | Freq: Every day | ORAL | Status: DC
  Administered 2022-03-10 – 2022-03-15 (×5): 17 g via ORAL
  Filled 2022-03-10 (×5): qty 1

## 2022-03-10 MED ORDER — TICAGRELOR 90 MG PO TABS: 90.0000 mg | ORAL_TABLET | Freq: Two times a day (BID) | ORAL | 0 refills | Status: DC

## 2022-03-10 MED ORDER — DOCUSATE SODIUM 100 MG PO CAPS
100.0000 mg | ORAL_CAPSULE | Freq: Two times a day (BID) | ORAL | Status: DC
  Administered 2022-03-10 – 2022-03-12 (×6): 100 mg via ORAL
  Filled 2022-03-10 (×6): qty 1

## 2022-03-10 MED ORDER — POLYETHYLENE GLYCOL 3350 17 G PO PACK: 17.0000 g | PACK | Freq: Every day | ORAL | 0 refills | Status: AC | PRN

## 2022-03-10 MED ORDER — DILTIAZEM HCL ER COATED BEADS 180 MG PO CP24
180.0000 mg | ORAL_CAPSULE | Freq: Every evening | ORAL | Status: DC
  Administered 2022-03-10 – 2022-03-14 (×5): 180 mg via ORAL
  Filled 2022-03-10 (×5): qty 1

## 2022-03-10 MED ORDER — DILTIAZEM HCL ER COATED BEADS 180 MG PO CP24: 180.0000 mg | ORAL_CAPSULE | Freq: Every evening | ORAL | 2 refills | Status: DC

## 2022-03-10 MED ORDER — DOCUSATE SODIUM 100 MG PO CAPS: 100.0000 mg | ORAL_CAPSULE | Freq: Two times a day (BID) | ORAL | 0 refills | Status: AC

## 2022-03-10 MED ORDER — APIXABAN 5 MG PO TABS: 5.0000 mg | ORAL_TABLET | Freq: Two times a day (BID) | ORAL | 2 refills | Status: DC

## 2022-03-10 MED ORDER — ATENOLOL 50 MG PO TABS: 50.0000 mg | ORAL_TABLET | Freq: Two times a day (BID) | ORAL | Status: DC

## 2022-03-10 MED ORDER — ATENOLOL 50 MG PO TABS
50.0000 mg | ORAL_TABLET | ORAL | Status: DC
  Administered 2022-03-11 – 2022-03-15 (×4): 50 mg via ORAL
  Filled 2022-03-10 (×3): qty 1

## 2022-03-10 MED FILL — Verapamil HCl IV Soln 2.5 MG/ML: INTRAVENOUS | Qty: 2 | Status: AC

## 2022-03-10 NOTE — Progress Notes (Signed)
? ?Progress Note ? ?Patient Name: ZOEE HEENEY ?Date of Encounter: 03/10/2022 ? ?Primary Cardiologist:   Minus Breeding, MD ? ? ?Subjective  ? ?She has had a little pressure and SOB.  However, no acute distress ? ?Inpatient Medications  ?  ?Scheduled Meds: ? apixaban  5 mg Oral BID  ? aspirin EC  81 mg Oral Daily  ? atenolol  50 mg Oral BID  ? busPIRone  15 mg Oral BID  ? calcium acetate  1,334 mg Oral TID with meals  ? diltiazem  180 mg Oral Daily  ? docusate sodium  100 mg Oral BID  ? famotidine  20 mg Oral QHS  ? gabapentin  400 mg Oral Q T,Th,Sat-1800  ? insulin aspart  0-5 Units Subcutaneous QHS  ? insulin aspart  0-6 Units Subcutaneous TID WC  ? insulin detemir  15 Units Subcutaneous QHS  ? pantoprazole  40 mg Oral Daily  ? polyethylene glycol  17 g Oral Daily  ? QUEtiapine  100 mg Oral QHS  ? rOPINIRole  1 mg Oral QHS  ? rosuvastatin  5 mg Oral Daily  ? sodium chloride flush  3 mL Intravenous Q12H  ? sodium chloride flush  3 mL Intravenous Q12H  ? ticagrelor  90 mg Oral BID  ? torsemide  20 mg Oral Daily  ? ?Continuous Infusions: ? sodium chloride    ? ?PRN Meds: ?sodium chloride, acetaminophen **OR** acetaminophen, calcium acetate, hydrOXYzine, levalbuterol, morphine (PF), nitroGLYCERIN, oxyCODONE, promethazine, sodium chloride flush  ? ?Vital Signs  ?  ?Vitals:  ? 03/09/22 1949 03/09/22 2315 03/10/22 0403 03/10/22 0755  ?BP: (!) 138/98 (!) 152/67 (!) 154/58 118/67  ?Pulse: 80 80 84   ?Resp: 20 20 15 15   ?Temp: 97.8 ?F (36.6 ?C) 98 ?F (36.7 ?C) 98.3 ?F (36.8 ?C) 98 ?F (36.7 ?C)  ?TempSrc: Oral Oral Oral   ?SpO2: 98% 98% 98%   ?Weight:      ?Height:      ? ? ?Intake/Output Summary (Last 24 hours) at 03/10/2022 0914 ?Last data filed at 03/10/2022 0200 ?Gross per 24 hour  ?Intake 0 ml  ?Output 500 ml  ?Net -500 ml  ? ?Filed Weights  ? 03/08/22 0515 03/09/22 0742 03/09/22 1127  ?Weight: 81.6 kg 81.6 kg 85 kg  ? ? ?Telemetry  ?  ?Atrial fib with controlled ventricular rate - Personally Reviewed ? ?ECG  ?  ?NA -  Personally Reviewed ? ?Physical Exam  ? ?BP 118/67 (BP Location: Left Arm)   Pulse 84   Temp 98 ?F (36.7 ?C)   Resp 15   Ht 5\' 3"  (1.6 m)   Wt 87 kg   SpO2 98%   BMI 33.98 kg/m?  ?GEN: No  acute distress.   ?Neck: No  JVD ?Cardiac: Irregular RR, no murmurs, rubs, or gallops.  ?Respiratory: Clear   to auscultation bilaterally. ?GI: Soft, nontender, non-distended, normal bowel sounds  ?MS:  No edema; No deformity. ?Neuro:   Nonfocal  ?Psych: Oriented and appropriate  ? ? ? ?Labs  ?  ?Chemistry ?Recent Labs  ?Lab 03/07/22 ?0112 03/08/22 ?0045 03/09/22 ?0106 03/09/22 ?1416  ?NA 131* 130* 128* 133*  ?K 3.6 4.0 4.5 4.2  ?CL 93* 95* 93* 97*  ?CO2 23 23 19* 23  ?GLUCOSE 103* 189* 136* 211*  ?BUN 66* 43* 52* 18  ?CREATININE 10.20* 7.64* 9.70* 5.30*  ?CALCIUM 8.9 8.4* 8.6* 8.8*  ?PROT  --   --  5.6*  --   ?ALBUMIN  3.1* 2.9* 3.2*  --   ?AST  --   --  12*  --   ?ALT  --   --  17  --   ?ALKPHOS  --   --  29  --   ?BILITOT  --   --  0.7  --   ?GFRNONAA 4* 5* 4* 8*  ?ANIONGAP 15 12 16* 13  ?  ? ?Hematology ?Recent Labs  ?Lab 03/09/22 ?0106 03/09/22 ?1416 03/10/22 ?1308  ?WBC 16.8* 13.6* 12.7*  ?RBC 3.97 3.97 3.41*  ?HGB 11.2* 11.3* 9.6*  ?HCT 34.2* 34.3* 29.7*  ?MCV 86.1 86.4 87.1  ?MCH 28.2 28.5 28.2  ?MCHC 32.7 32.9 32.3  ?RDW 17.4* 17.5* 17.6*  ?PLT 193 193 189  ? ? ?Cardiac EnzymesNo results for input(s): TROPONINI in the last 168 hours. No results for input(s): TROPIPOC in the last 168 hours.  ? ?BNP ?No results for input(s): BNP, PROBNP in the last 168 hours. ?  ? ?DDimer No results for input(s): DDIMER in the last 168 hours.  ? ?Radiology  ?  ?CARDIAC CATHETERIZATION ? ?Result Date: 03/08/2022 ?1.  Successful atherectomy, PTCA, and stenting of severe stenosis in the mid LAD, reducing 90% calcific stenosis to 0%, TIMI-3 flow pre and post, with a 3.0 x 18 mm Onyx frontier DES 2.  Successful atherectomy, PTCA, and stenting of severe stenosis in the mid RCA, reducing 95% calcific stenosis to 0%, TIMI-3 flow pre and post,  with a 3.0 x 34 mm Onyx frontier DES Recommendations: Patient with atrial fibrillation, significant comorbidities of end-stage renal disease.  Considering balance of antithrombotic efficacy and concern over bleeding risk with multidrug therapy, favor the following approach: Resume apixaban tomorrow at normal dosing schedule, no heparin overnight Ticagrelor 90 mg twice daily x3 months Aspirin 81 mg daily long-term after 3 months of ticagrelor completed   ? ?Cardiac Studies  ? ?Diagnostic ?Dominance: Right ?Intervention ? ? ? ? ? ?Patient Profile  ?   ?71 y.o. female with CAD (multiple prior cath with RI PCI),  HTN, Acute on Chronic Diastolic Heart Failure, prior CVA, PAD, with COPD who was seen 03/05/2022 for the evaluation of new PAF CHADSVASC 6, with chest pain at HD. ? ?Assessment & Plan  ?  ?PAF:    Started Eliquis.  Fib has been paroxysmal.  Rate is controlled.     ? ?UNSTABLE ANGINA:    Status post two vessel PCI as above.  Plan is for Brilinta 90 mg bid x 3 months.  This will be in conjunction with Eliquis above.  After 3 months of Brilinta will plan ASA and Eliquis.  I will stop the ASA that she is on.  ? ?ESRD:    Dialysis yesterday with hypotension.    She was on Torsemide previously and I will resume this.   ? ?PULMONARY HTN:  I will follow this clinically.  ? ?HTN:    See previous note around hypertension management.    ? ?DM:  Resume Semaglutide per primary team.  ? ? ?For questions or updates, please contact [place] ?Please consult [email or web address] for contact info under Cardiology/STEMI. ?  ?Signed, ?Minus Breeding, MD  ?03/10/2022, 9:14 AM   ? ?

## 2022-03-10 NOTE — Progress Notes (Signed)
?PROGRESS NOTE ? ? ? ?LILLIONNA NABI  UUV:[id number] DOB: [birth year] DOA: 03/04/2022 ?PCP: [name], MD  ? ? ?Brief Narrative:  ? ?Patient is a 71 years old female with history of COPD, hypertension, diabetes mellitus, end-stage renal disease on hemodialysis presented to hospital with chest discomfort and new onset atrial fibrillation.  In the ED, patient was noted to have elevated troponin.  Patient underwent cardiac catheterization on 03/06/2022 which showed severe two-vessel disease involving RCA and LAD.  Patient was thought to be high risk for CT surgery so  underwent PCI on 03/08/2022. Nephrology following for dialysis needs  ?  ?Assessment and Plan: ? ?Principal Problem: ?  Atrial fibrillation with RVR ([place]) ?Active Problems: ?  Unstable angina ([place]) ?  End stage renal disease on dialysis due to type 2 diabetes mellitus ([place]) ?  Pulmonary hypertension ([place]) ?  Type II diabetes mellitus with nephropathy ([place]) ?  Hypertension associated with diabetes ([place]) ?  Hyperlipidemia associated with type 2 diabetes mellitus ([place]) ?  Obesity (BMI 30.0-34.9) ?  Leukocytosis ?  GERD (gastroesophageal reflux disease) ?  Hypokalemia ?  Physical deconditioning ?  Hyponatremia ?  ?* Atrial fibrillation with RVR ([place]) ?Rate controlled at this time.  Currently on aspirin atenolol and Eliquis, Long-acting Cardizem .  2D echocardiogram this time showed normal EF but severe pulmonary hypertension. TSH 4.7 ? ?Unstable angina ?Patient underwent cardiac catheterization on 03/06/2022 with  severe two-vessel disease involving LAD and RCA.  Patient subsequently underwent cardiac catheterization and coronary atherectomy, PCI and stenting of the severe stenosis in the mid LAD, RCA on 03/08/2022.  Continue aspirin, Brilinta, beta-blocker, statins, Eliquis..  Patient was thought to be high risk for surgery as per cardiothoracic surgery.  Plan is to continue Brilinta for 90 days followed by aspirin and Eliquis alone. ? ?End stage renal disease on  dialysis due to type 2 diabetes mellitus ([place]) ?Nephrology on board for hemodialysis need.  Tuesday Thursday Saturday schedule at baseline.  Patient is undergoing hemodialysis again today.  I spoke with nephrology about it. ? ?Pulmonary hypertension ([place]) ?Does have history of COPD likely causing pulmonary hypertension.  RV systolic pressure was 61.   ? ?Type II diabetes mellitus with nephropathy ([place]) ?Patient uses 25 units of basal insulin at home has been reduced to 15 units sliding scale while in the hospital.  Continue gabapentin for neuropathy.  POC glucose of 219 ? ?Hypertension associated with diabetes ([place]) ?Continue atenolol, long-acting Cardizem.  Hypotensive during hemodialysis.  Cardiology suggesting holding atenolol on the morning of hemodialysis and  and using Cardizem in the evening postdialysis . ? ?Hypokalemia ?Replenished and adjusted during hemodialysis.  Latest potassium of 4.2 ? ?Hyponatremia.  On hemodialysis.  Has been started on torsemide as well.  Sodium level 133 today. ? ?GERD (gastroesophageal reflux disease) ?Continue Pepcid and Protonix. ? ?Leukocytosis ?Likely reactive.  Latest WBC at 16.8.  Afebrile.  No signs of infection at this time.  Chest x-ray without any acute infiltrate. ? ?Obesity (BMI 30.0-34.9) ?BMI 31.05.  Would benefit from weight loss as outpatient. ? ?Hyperlipidemia associated with type 2 diabetes mellitus ([place]) ?Continue statin ? ?Weakness and deconditioning.  Physical therapy has seen the patient and will consider home health on discharge. ? ? ? DVT prophylaxis: SCDs Start: 03/05/22 0239 ?apixaban (ELIQUIS) tablet 5 mg  ? ?Code Status:   ?  Code Status: Full Code ? ?Disposition: Home likely by tomorrow when okay with nephrology.. ? ?Status is: Inpatient ? ?Remains inpatient appropriate because:  Status post PCI, hypotension during hemodialysis, medication adjustment, hemodialysis again today ? ? Family Communication:  ?Spoke with the patient at  bedside. ? ?Consultants:  ?Cardiology ?Cardiothoracic surgery ?Nephrology ? ?Procedures:  ?Cardiac catheterization on 03/06/2022  ?Cardiac catheterization on 03/08/2022 with coronary atherectomy, PCI to mid LAD and RCA ? ?Antimicrobials:  ?None ? ?Anti-infectives (From admission, onward)  ? ? None  ? ?  ? ?Subjective: ?Today, patient was seen and examined at bedside.  Patient complains of mild chest discomfort and shortness of breath.  States that her weight has not been ideal despite dialysis. ? ?Objective: ?Vitals:  ? 03/10/22 0755 03/10/22 0900 03/10/22 1106 03/10/22 1300  ?BP: 118/67  (!) 142/73   ?Pulse:      ?Resp: 15  14   ?Temp: 98 ?F (36.7 ?C)  97.8 ?F (36.6 ?C)   ?TempSrc:      ?SpO2:    100%  ?Weight:  87 kg    ?Height:      ? ? ?Intake/Output Summary (Last 24 hours) at 03/10/2022 1441 ?Last data filed at 03/10/2022 1100 ?Gross per 24 hour  ?Intake 240 ml  ?Output --  ?Net 240 ml  ? ? ?Filed Weights  ? 03/09/22 0742 03/09/22 1127 03/10/22 0900  ?Weight: 81.6 kg 85 kg 87 kg  ? ? ?Physical Examination: ?Body mass index is 33.98 kg/m?.  ? ?General: Obese built, not in obvious distress, on nasal cannula oxygen ?HENT:   No scleral pallor or icterus noted. Oral mucosa is moist.  ?Chest:  .  Diminished breath sounds bilaterally.  ?CVS: S1 &S2 heard. No murmur.  Irregular rhythm. ?Abdomen: Soft, nontender, nondistended.  Bowel sounds are heard.   ?Extremities: No cyanosis, clubbing or edema.  Peripheral pulses are palpable.  Right AV fistula. ?Psych: Alert, awake and oriented, normal mood ?CNS:  No cranial nerve deficits.  Power equal in all extremities.   ?Skin: Warm and dry.  No rashes noted. ? ? ?Data Reviewed:  ? ?CBC: ?Recent Labs  ?Lab 03/05/22 ?0740 03/06/22 ?0616 03/07/22 ?0112 03/08/22 ?0045 03/09/22 ?0106 03/09/22 ?1416 03/10/22 ?0272  ?WBC 13.7*   < > 15.6* 13.7* 16.8* 13.6* 12.7*  ?NEUTROABS 11.3*  --   --   --   --   --   --   ?HGB 11.6*   < > 10.6* 10.2* 11.2* 11.3* 9.6*  ?HCT 34.7*   < > 31.9* 31.4* 34.2*  34.3* 29.7*  ?MCV 84.2   < > 84.4 86.7 86.1 86.4 87.1  ?PLT 223   < > 205 164 193 193 189  ? < > = values in this interval not displayed.  ? ? ? ?Basic Metabolic Panel: ?Recent Labs  ?Lab 03/06/22 ?0616 03/07/22 ?0112 03/08/22 ?0045 03/09/22 ?0106 03/09/22 ?1416 03/10/22 ?5366  ?NA 132* 131* 130* 128* 133*  --   ?K 3.7 3.6 4.0 4.5 4.2  --   ?CL 93* 93* 95* 93* 97*  --   ?CO2 24 23 23  19* 23  --   ?GLUCOSE 135* 103* 189* 136* 211*  --   ?BUN 60* 66* 43* 52* 18  --   ?CREATININE 9.17* 10.20* 7.64* 9.70* 5.30*  --   ?CALCIUM 9.2 8.9 8.4* 8.6* 8.8*  --   ?MG  --   --   --  2.1  --  2.1  ?PHOS  --  8.5* 5.7*  --   --  4.6  ? ? ? ?Liver Function Tests: ?Recent Labs  ?Lab 03/07/22 ?0112 03/08/22 ?0045 03/09/22 ?  0106  ?AST  --   --  12*  ?ALT  --   --  17  ?ALKPHOS  --   --  47  ?BILITOT  --   --  0.7  ?PROT  --   --  5.6*  ?ALBUMIN 3.1* 2.9* 3.2*  ? ? ? ? ?Radiology Studies: ?No results found. ? ? ? LOS: 6 days  ? ? ?Flora Lipps, MD ?[facility] ?03/10/2022, 2:41 PM  ? ? ?

## 2022-03-10 NOTE — Progress Notes (Signed)
[facility] ?Progress Note ? ?Subjective: some resp c/o's, wheezing per the pt, this am. We stood her to weigh at 87kg. She is worried about getting too much fluid on.  ? ?Vitals:  ? 03/09/22 2315 03/10/22 0403 03/10/22 0755 03/10/22 0900  ?BP: (!) 152/67 (!) 154/58 118/67   ?Pulse: 80 84    ?Resp: 20 15 15    ?Temp: 98 ?F (36.7 ?C) 98.3 ?F (36.8 ?C) 98 ?F (36.7 ?C)   ?TempSrc: Oral Oral    ?SpO2: 98% 98%    ?Weight:    87 kg  ?Height:      ? ? ?Exam: ? Gen alert, no distress, 2L O2 [place] ?No jvd or bruits ?Chest clear L, faint rales R base ?RRR no RG ?Abd soft ntnd no mass or ascites +bs ?Ext no sig LE edema ?Neuro is alert, Ox 3 , nf ? RUA aVF+bruit ?  ? ?  4/1 CXR - no acute disease ? ? ? OP HD: [facility] ?  4h   84kg   350/500  RUE AVF  Heparin 1000u/hr ? - 4/04 > hep B Ag neg, and hep B Abs were low/ not protective ? - rocaltrol 1.75 ug tiw ? - mircera 50 ug q 4 wks ? - venofer 50mg  weekly ? ? ?Assessment/ Plan: ?Chest pain: presented in a.fib with RVR. On diltiazem. Rate controlled at present. Management per primary/cardiology. SP LHC which showed severe 2V CAD. Seen by TCTS, too high risk for surgery. Had atherectomy/ PCI to LAD/ RCA lesions on 4/05. Per cards/ pmd.  ? ESRD:  on HD TTS. Extra HD today for vol overload. See #4 also.   ?Hypokalemia: K 4.0 today. 4K bath next HD.  ? Hypertension/volume: BP moderately elevated. On diltiazem and atenolol, agree w/ holding atenolol the morning of dialysis and the prior evening. Agree w/ diltiazem to be given qhs. LVEDP from cath 4/03 was high at 30 mmHg. CXR 4/01 negative. Is now 3kg over and having some SOB. Will plan extra HD today off schedule and HD again here tomorrow, try and lower volume and dry wt.  ? Anemia: Hgb 9-12 here. No esa needs.  ? Metabolic bone disease: Calcium controlled. Phos down. Continue calcium acetate. Cont rocaltrol.  ? ? ? ? ?[name] ?03/10/2022, 10:09 AM ? ? ?Recent Labs  ?Lab 03/08/22 ?0045 03/09/22 ?0106 03/09/22 ?1416  03/10/22 ?1610  ?HGB 10.2* 11.2* 11.3* 9.6*  ?ALBUMIN 2.9* 3.2*  --   --   ?CALCIUM 8.4* 8.6* 8.8*  --   ?PHOS 5.7*  --   --  4.6  ?CREATININE 7.64* 9.70* 5.30*  --   ?K 4.0 4.5 4.2  --   ? ? ?Inpatient medications: ? apixaban  5 mg Oral BID  ? atenolol  50 mg Oral BID  ? busPIRone  15 mg Oral BID  ? calcium acetate  1,334 mg Oral TID with meals  ? diltiazem  180 mg Oral Daily  ? docusate sodium  100 mg Oral BID  ? famotidine  20 mg Oral QHS  ? gabapentin  400 mg Oral Q T,Th,Sat-1800  ? insulin aspart  0-5 Units Subcutaneous QHS  ? insulin aspart  0-6 Units Subcutaneous TID WC  ? insulin detemir  15 Units Subcutaneous QHS  ? pantoprazole  40 mg Oral Daily  ? polyethylene glycol  17 g Oral Daily  ? QUEtiapine  100 mg Oral QHS  ? rOPINIRole  1 mg Oral QHS  ? rosuvastatin  5  mg Oral Daily  ? sodium chloride flush  3 mL Intravenous Q12H  ? sodium chloride flush  3 mL Intravenous Q12H  ? ticagrelor  90 mg Oral BID  ? torsemide  20 mg Oral Daily  ? ? sodium chloride    ? ?sodium chloride, acetaminophen **OR** acetaminophen, calcium acetate, hydrOXYzine, levalbuterol, morphine (PF), nitroGLYCERIN, oxyCODONE, promethazine, sodium chloride flush ? ? ? ? ? ? ? ?

## 2022-03-10 NOTE — TOC Benefit Eligibility Note (Signed)
Patient Advocate Encounter ?  ?Insurance verification completed.   ?  ?The patient is currently admitted and upon discharge could be taking BRILINTA. ?  ?The current 30 day co-pay is, $70.  ? ?The patient is currently admitted and upon discharge could be taking ELIQUIS. ?  ?The current 30 day co-pay is, $126.  ? ?The patient is insured through [employer]. ? ? ?  ? ?

## 2022-03-10 NOTE — Progress Notes (Signed)
CARDIAC REHAB PHASE I  ? ?Offered to walk with pt. Pt recently ambulated with PT, hoping to go to HD soon. Reinforced importance of Brilinta and Eliquis. Reviewed site care, restrictions, and encouraged continued ambulation with emphasis on safety. Will refer to CRP II [place].  ? ?[id number] ?Rufina Falco, RN BSN ?03/10/2022 ?1:33 PM ? ?

## 2022-03-10 NOTE — Progress Notes (Signed)
Physical Therapy Treatment ?Patient Details ?Name: [name] ?MRN: [id number] ?DOB: May 12, 1951 ?Today's Date: 03/10/2022 ? ? ?History of Present Illness 71 y/o female adm 4/1 with chest pressure, tightness and SOB with new onset A-fib with RVR. 4/5 heart cath via left fem.PMHx: CKD on HD, CAD, CHF, COPD, HTN, PVD, neuropathy, CVA. ? ?  ?PT Comments  ? ? Pt pleasant and ending session with OT on arrival. Pt able to maintain SpO2 93-97% on RA throughout session with HR 80-88. Pt with increased gait tolerance but reports very limited ambulation at home due to bil Hip pain and weakness. Pt encouraged to continue walking program and HEP at home. Will continue to follow.  ?   ?Recommendations for follow up therapy are one component of a multi-disciplinary discharge planning process, led by the attending physician.  Recommendations may be updated based on patient status, additional functional criteria and insurance authorization. ? ?Follow Up Recommendations ? Home health PT ?  ?  ?Assistance Recommended at Discharge Intermittent Supervision/Assistance  ?Patient can return home with the following A little help with walking and/or transfers;A little help with bathing/dressing/bathroom;Assistance with cooking/housework;Assist for transportation;Help with stairs or ramp for entrance ?  ?Equipment Recommendations ? None recommended by PT  ?  ?Recommendations for Other Services   ? ? ?  ?Precautions / Restrictions Precautions ?Precautions: Fall ?Precaution Comments: watch HR  ?  ? ?Mobility ? Bed Mobility ?  ?  ?  ?  ?  ?  ?  ?General bed mobility comments: standing with OT on arrival ?  ? ?Transfers ?  ?  ?Transfers: Sit to/from Stand ?Sit to Stand: Supervision ?  ?  ?  ?  ?  ?General transfer comment: cues for hand placement ?  ? ?Ambulation/Gait ?Ambulation/Gait assistance: Min guard ?Gait Distance (Feet): 300 Feet ?Assistive device: Rolling walker (2 wheels) ?Gait Pattern/deviations: Step-through pattern, Decreased stride  length ?  ?Gait velocity interpretation: 1.31 - 2.62 ft/sec, indicative of limited community ambulator ?  ?General Gait Details: pt with cues for proximity to RW and encouragement to maximize mobility ? ? ?Stairs ?Stairs: Yes ?Stairs assistance: Min guard ?Stair Management: Step to pattern, Forwards, One rail Right ?Number of Stairs: 2 ?General stair comments: cues for sequence with reliance on rail of bil UE ? ? ?Wheelchair Mobility ?  ? ?Modified Rankin (Stroke Patients Only) ?  ? ? ?  ?Balance   ?  ?  ?  ?  ?Standing balance support: Reliant on assistive device for balance, During functional activity, No upper extremity supported, Bilateral upper extremity supported ?Standing balance-Leahy Scale: Fair ?Standing balance comment: no UE support at sink, RW for gait ?  ?  ?  ?  ?  ?  ?  ?  ?  ?  ?  ?  ? ?  ?Cognition Arousal/Alertness: Awake/alert ?Behavior During Therapy: [facility] for tasks assessed/performed ?Overall Cognitive Status: Within Functional Limits for tasks assessed ?  ?  ?  ?  ?  ?  ?  ?  ?  ?  ?  ?  ?  ?  ?  ?  ?  ?  ?  ? ?  ?Exercises General Exercises - Lower Extremity ?Long Arc Quad: AROM, Both, 15 reps, Seated ? ?  ?General Comments General comments (skin integrity, edema, etc.): SpO2 99% on RA ?  ?  ? ?Pertinent Vitals/Pain Pain Assessment ?Pain Score: 7  ?Pain Location: left hip ?Pain Descriptors / Indicators: Aching, Guarding, Sore ?Pain Intervention(s): Limited activity  within patient's tolerance, Monitored during session, Repositioned  ? ? ?Home Living   ?  ?  ?  ?  ?  ?  ?  ?  ?  ?   ?  ?Prior Function    ?  ?  ?   ? ?PT Goals (current goals can now be found in the care plan section) Progress towards PT goals: Progressing toward goals ? ?  ?Frequency ? ? ? Min 3X/week ? ? ? ?  ?PT Plan Current plan remains appropriate  ? ? ?Co-evaluation   ?  ?  ?  ?  ? ?  ?AM-PAC PT "6 Clicks" Mobility   ?Outcome Measure ? Help needed turning from your back to your side while in a flat bed without using  bedrails?: None ?Help needed moving from lying on your back to sitting on the side of a flat bed without using bedrails?: None ?Help needed moving to and from a bed to a chair (including a wheelchair)?: A Little ?Help needed standing up from a chair using your arms (e.g., wheelchair or bedside chair)?: A Little ?Help needed to walk in hospital room?: A Little ?Help needed climbing 3-5 steps with a railing? : A Little ?6 Click Score: 20 ? ?  ?End of Session Equipment Utilized During Treatment: Gait belt ?Activity Tolerance: Patient tolerated treatment well ?Patient left: in chair;with call bell/phone within reach ?Nurse Communication: Mobility status ?PT Visit Diagnosis: Muscle weakness (generalized) (M62.81);Difficulty in walking, not elsewhere classified (R26.2);Other abnormalities of gait and mobility (R26.89) ?  ? ? ?Time: 5003-7048 ?PT Time Calculation (min) (ACUTE ONLY): 24 min ? ?Charges:  $Gait Training: 8-22 mins ?$Therapeutic Activity: 8-22 mins          ?          ? ?Eather Chaires P, PT ?Acute Rehabilitation Services ?Pager: [phone] ?Office: [phone] ? ? ? ?Harshika Mago B Nesiah Jump ?03/10/2022, 12:40 PM ? ?

## 2022-03-10 NOTE — Plan of Care (Signed)
?  Problem: Education: ?Goal: Knowledge of disease or condition will improve ?Outcome: Progressing ?Goal: Understanding of medication regimen will improve ?Outcome: Progressing ?Goal: Individualized Educational Video(s) ?Outcome: Progressing ?  ?Problem: Activity: ?Goal: Ability to tolerate increased activity will improve ?Outcome: Progressing ?  ?Problem: Cardiac: ?Goal: Ability to achieve and maintain adequate cardiopulmonary perfusion will improve ?Outcome: Progressing ?  ?Problem: Health Behavior/Discharge Planning: ?Goal: Ability to safely manage health-related needs after discharge will improve ?Outcome: Progressing ?  ?Problem: Education: ?Goal: Understanding of CV disease, CV risk reduction, and recovery process will improve ?Outcome: Progressing ?Goal: Individualized Educational Video(s) ?Outcome: Progressing ?  ?Problem: Activity: ?Goal: Ability to return to baseline activity level will improve ?Outcome: Progressing ?  ?Problem: Cardiovascular: ?Goal: Ability to achieve and maintain adequate cardiovascular perfusion will improve ?Outcome: Progressing ?Goal: Vascular access site(s) Level 0-1 will be maintained ?Outcome: Progressing ?  ?Problem: Health Behavior/Discharge Planning: ?Goal: Ability to safely manage health-related needs after discharge will improve ?Outcome: Progressing ?  ?Problem: Education: ?Goal: Understanding of CV disease, CV risk reduction, and recovery process will improve ?Outcome: Progressing ?Goal: Individualized Educational Video(s) ?Outcome: Progressing ?  ?Problem: Activity: ?Goal: Ability to return to baseline activity level will improve ?Outcome: Progressing ?  ?Problem: Cardiovascular: ?Goal: Ability to achieve and maintain adequate cardiovascular perfusion will improve ?Outcome: Progressing ?Goal: Vascular access site(s) Level 0-1 will be maintained ?Outcome: Progressing ?  ?Problem: Health Behavior/Discharge Planning: ?Goal: Ability to safely manage health-related needs after  discharge will improve ?Outcome: Progressing ?  ?

## 2022-03-10 NOTE — Progress Notes (Signed)
Occupational Therapy Treatment ?Patient Details ?Name: [name] ?MRN: [id number] ?DOB: 09-27-1951 ?Today's Date: 03/10/2022 ? ? ?History of present illness 71 y/o female adm 4/1 with chest pressure, tightness and SOB with new onset A-fib with RVR. 4/5 heart cath via left fem.PMHx: CKD on HD, CAD, CHF, COPD, HTN, PVD, neuropathy, CVA. ?  ?OT comments ? Pt progressing towards established OT goals. Demonstrating increased activity tolerance to perform grooming tasks while standing at sink with Supervision. SpO2 99% on 2L and maintaining in 90s on RA. Continue to follow acutely as admitted and recommend dc to home with [place].   ? ?Recommendations for follow up therapy are one component of a multi-disciplinary discharge planning process, led by the attending physician.  Recommendations may be updated based on patient status, additional functional criteria and insurance authorization. ?   ?Follow Up Recommendations ? Home health OT  ?  ?Assistance Recommended at Discharge Set up Supervision/Assistance  ?Patient can return home with the following ? A little help with walking and/or transfers;A little help with bathing/dressing/bathroom ?  ?Equipment Recommendations ? None recommended by OT  ?  ?Recommendations for Other Services   ? ?  ?Precautions / Restrictions Precautions ?Precautions: Fall ?Precaution Comments: watch HR  ? ? ?  ? ?Mobility Bed Mobility ?Overal bed mobility: Modified Independent ?  ?  ?  ?  ?  ?  ?General bed mobility comments: Sitting EOB upon PT arrival. ?  ? ?Transfers ?Overall transfer level: Needs assistance ?Equipment used: Rolling walker (2 wheels) ?Transfers: Sit to/from Stand ?Sit to Stand: Supervision ?  ?  ?  ?  ?  ?General transfer comment: Supervision for safety ?  ?  ?Balance Overall balance assessment: Needs assistance ?Sitting-balance support: Feet supported, No upper extremity supported ?Sitting balance-Leahy Scale: Good ?  ?  ?Standing balance support: Reliant on assistive device for  balance, During functional activity, No upper extremity supported ?Standing balance-Leahy Scale: Fair ?  ?  ?  ?  ?  ?  ?  ?  ?  ?  ?  ?  ?   ? ?ADL either performed or assessed with clinical judgement  ? ?ADL Overall ADL's : Needs assistance/impaired ?  ?  ?Grooming: Dance movement psychotherapist;Supervision/safety;Standing;Oral care ?  ?  ?  ?  ?  ?  ?  ?  ?  ?  ?  ?  ?  ?  ?  ?Functional mobility during ADLs: Min guard;Rolling walker (2 wheels) ?General ADL Comments: Pt agreeable to perform grooming task at sink. demonstrating increased activity tolerance to complete in standing with Supervision ?  ? ?Extremity/Trunk Assessment Upper Extremity Assessment ?Upper Extremity Assessment: Overall WFL for tasks assessed ?  ?Lower Extremity Assessment ?Lower Extremity Assessment: Defer to PT evaluation ?RLE Sensation: history of peripheral neuropathy ?LLE Sensation: history of peripheral neuropathy ?  ?  ?  ? ?Vision   ?  ?  ?Perception   ?  ?Praxis   ?  ? ?Cognition Arousal/Alertness: Awake/alert ?Behavior During Therapy: [facility] for tasks assessed/performed ?Overall Cognitive Status: Within Functional Limits for tasks assessed ?  ?  ?  ?  ?  ?  ?  ?  ?  ?  ?  ?  ?  ?  ?  ?  ?  ?  ?  ?   ?Exercises   ? ?  ?Shoulder Instructions   ? ? ?  ?General Comments SpO2 99% on RA  ? ? ?Pertinent Vitals/ Pain  Pain Assessment ?Pain Assessment: Faces ?Faces Pain Scale: Hurts a little bit ?Pain Location: BLEs ?Pain Descriptors / Indicators: Sore ?Pain Intervention(s): Monitored during session, Limited activity within patient's tolerance, Repositioned ? ?Home Living   ?  ?  ?  ?  ?  ?  ?  ?  ?  ?  ?  ?  ?  ?  ?  ?  ?  ?  ? ?  ?Prior Functioning/Environment    ?  ?  ?  ?   ? ?Frequency ? Min 2X/week  ? ? ? ? ?  ?Progress Toward Goals ? ?OT Goals(current goals can now be found in the care plan section) ? Progress towards OT goals: Progressing toward goals ? ?Acute Rehab OT Goals ?OT Goal Formulation: With patient ?Time For Goal Achievement:  03/21/22 ?Potential to Achieve Goals: Good ?ADL Goals ?Pt Will Perform Upper Body Bathing: with modified independence;sitting (using EC) ?Pt Will Perform Lower Body Bathing: with modified independence;sit to/from stand (using EC) ?Pt Will Transfer to Toilet: with modified independence;ambulating;regular height toilet  ?Plan Discharge plan remains appropriate   ? ?Co-evaluation ? ? ?   ?  ?  ?  ?  ? ?  ?AM-PAC OT "6 Clicks" Daily Activity     ?Outcome Measure ? ? Help from another person eating meals?: None ?Help from another person taking care of personal grooming?: None ?Help from another person toileting, which includes using toliet, bedpan, or urinal?: A Little ?Help from another person bathing (including washing, rinsing, drying)?: A Little ?Help from another person to put on and taking off regular upper body clothing?: A Little ?Help from another person to put on and taking off regular lower body clothing?: A Little ?6 Click Score: 20 ? ?  ?End of Session Equipment Utilized During Treatment: Rolling walker (2 wheels);Gait belt ? ?OT Visit Diagnosis: Unsteadiness on feet (R26.81);Muscle weakness (generalized) (M62.81) ?  ?Activity Tolerance Patient tolerated treatment well ?  ?Patient Left Other (comment) (in hallway with PT) ?  ?Nurse Communication Mobility status ?  ? ?   ? ?Time: 0017-4944 ?OT Time Calculation (min): 18 min ? ?Charges: OT General Charges ?$OT Visit: 1 Visit ?OT Treatments ?$Self Care/Home Management : 8-22 mins ? ?Harold Moncus MSOT, OTR/L ?Acute Rehab ?Pager: [phone] ?Office: [phone] ? ?Rachit Grim M Jayleen Afonso ?03/10/2022, 12:22 PM ? ? ?

## 2022-03-11 DIAGNOSIS — K219 Gastro-esophageal reflux disease without esophagitis: Secondary | ICD-10-CM | POA: Diagnosis not present

## 2022-03-11 DIAGNOSIS — E1169 Type 2 diabetes mellitus with other specified complication: Secondary | ICD-10-CM | POA: Diagnosis not present

## 2022-03-11 DIAGNOSIS — E1122 Type 2 diabetes mellitus with diabetic chronic kidney disease: Secondary | ICD-10-CM | POA: Diagnosis not present

## 2022-03-11 DIAGNOSIS — I4891 Unspecified atrial fibrillation: Secondary | ICD-10-CM | POA: Diagnosis not present

## 2022-03-11 LAB — GLUCOSE, CAPILLARY
Glucose-Capillary: 153 mg/dL — ABNORMAL HIGH (ref 70–99)
Glucose-Capillary: 165 mg/dL — ABNORMAL HIGH (ref 70–99)
Glucose-Capillary: 191 mg/dL — ABNORMAL HIGH (ref 70–99)
Glucose-Capillary: 234 mg/dL — ABNORMAL HIGH (ref 70–99)

## 2022-03-11 LAB — CBC
HCT: 29.2 % — ABNORMAL LOW (ref 36.0–46.0)
Hemoglobin: 9.3 g/dL — ABNORMAL LOW (ref 12.0–15.0)
MCH: 28 pg (ref 26.0–34.0)
MCHC: 31.8 g/dL (ref 30.0–36.0)
MCV: 88 fL (ref 80.0–100.0)
Platelets: 183 10*3/uL (ref 150–400)
RBC: 3.32 MIL/uL — ABNORMAL LOW (ref 3.87–5.11)
RDW: 17.6 % — ABNORMAL HIGH (ref 11.5–15.5)
WBC: 12.7 10*3/uL — ABNORMAL HIGH (ref 4.0–10.5)
nRBC: 0 % (ref 0.0–0.2)

## 2022-03-11 LAB — MAGNESIUM: Magnesium: 2 mg/dL (ref 1.7–2.4)

## 2022-03-11 LAB — BASIC METABOLIC PANEL
Anion gap: 7 (ref 5–15)
BUN: 12 mg/dL (ref 8–23)
CO2: 31 mmol/L (ref 22–32)
Calcium: 9 mg/dL (ref 8.9–10.3)
Chloride: 96 mmol/L — ABNORMAL LOW (ref 98–111)
Creatinine, Ser: 4.76 mg/dL — ABNORMAL HIGH (ref 0.44–1.00)
GFR, Estimated: 9 mL/min — ABNORMAL LOW (ref >60)
Glucose, Bld: 175 mg/dL — ABNORMAL HIGH (ref 70–99)
Potassium: 3.8 mmol/L (ref 3.5–5.1)
Sodium: 134 mmol/L — ABNORMAL LOW (ref 135–145)

## 2022-03-11 MED ORDER — APIXABAN 5 MG PO TABS
5.0000 mg | ORAL_TABLET | Freq: Two times a day (BID) | ORAL | Status: DC
  Administered 2022-03-12 – 2022-03-15 (×7): 5 mg via ORAL
  Filled 2022-03-11 (×8): qty 1

## 2022-03-11 NOTE — Progress Notes (Signed)
?Cardiology Progress Note  ?Patient ID: [name] ?MRN: [id number] ?DOB: 01-28-1951 ?Date of Encounter: 03/11/2022 ? ?Primary Cardiologist: Minus Breeding, MD ? ?Subjective  ? ?Chief Complaint: None. ? ?HPI: Seen in hemodialysis this morning.  Tolerating this well.  Reports atypical right-sided chest discomfort.  Back in A-fib but rates are controlled. ? ?ROS:  ?All other ROS reviewed and negative. Pertinent positives noted in the HPI.    ? ?Inpatient Medications  ?Scheduled Meds: ? apixaban  5 mg Oral BID  ? atenolol  50 mg Oral Once per day on Mon Wed Fri Sat  ? atenolol  50 mg Oral Once per day on Sun Tue Thu Sat  ? busPIRone  15 mg Oral BID  ? calcium acetate  1,334 mg Oral TID with meals  ? diltiazem  180 mg Oral QPM  ? docusate sodium  100 mg Oral BID  ? famotidine  20 mg Oral QHS  ? gabapentin  400 mg Oral Q T,Th,Sat-1800  ? insulin aspart  0-5 Units Subcutaneous QHS  ? insulin aspart  0-6 Units Subcutaneous TID WC  ? insulin detemir  15 Units Subcutaneous QHS  ? pantoprazole  40 mg Oral Daily  ? polyethylene glycol  17 g Oral Daily  ? QUEtiapine  100 mg Oral QHS  ? rOPINIRole  1 mg Oral QHS  ? rosuvastatin  5 mg Oral Daily  ? sodium chloride flush  3 mL Intravenous Q12H  ? sodium chloride flush  3 mL Intravenous Q12H  ? ticagrelor  90 mg Oral BID  ? torsemide  20 mg Oral Daily  ? ?Continuous Infusions: ? sodium chloride    ? ?PRN Meds: ?sodium chloride, acetaminophen **OR** acetaminophen, calcium acetate, hydrOXYzine, levalbuterol, morphine (PF), nitroGLYCERIN, oxyCODONE, promethazine, sodium chloride flush  ? ?Vital Signs  ? ?Vitals:  ? 03/11/22 0745 03/11/22 0800 03/11/22 0830 03/11/22 0900  ?BP: 131/60 131/61 135/71 132/69  ?Pulse: 94 79 81 83  ?Resp:      ?Temp:  (!) 97.3 ?F (36.3 ?C)    ?TempSrc:  Temporal    ?SpO2: 96% 99%    ?Weight: 82 kg     ?Height:      ? ? ?Intake/Output Summary (Last 24 hours) at 03/11/2022 0910 ?Last data filed at 03/10/2022 2325 ?Gross per 24 hour  ?Intake 480 ml  ?Output 3000  ml  ?Net -2520 ml  ? ? ?  03/11/2022  ?  7:45 AM 03/10/2022  ?  6:33 PM 03/10/2022  ?  2:54 PM  ?Last 3 Weights  ?Weight (lbs) 180 lb 12.4 oz 180 lb 12.4 oz 187 lb 6.3 oz  ?Weight (kg) 82 kg 82 kg 85 kg  ?   ? ?Telemetry  ?Overnight telemetry shows A-fib 90-100 bpm, which I personally reviewed.  ? ? ?Physical Exam  ? ?Vitals:  ? 03/11/22 0745 03/11/22 0800 03/11/22 0830 03/11/22 0900  ?BP: 131/60 131/61 135/71 132/69  ?Pulse: 94 79 81 83  ?Resp:      ?Temp:  (!) 97.3 ?F (36.3 ?C)    ?TempSrc:  Temporal    ?SpO2: 96% 99%    ?Weight: 82 kg     ?Height:      ?  ?Intake/Output Summary (Last 24 hours) at 03/11/2022 0910 ?Last data filed at 03/10/2022 2325 ?Gross per 24 hour  ?Intake 480 ml  ?Output 3000 ml  ?Net -2520 ml  ?  ? ?  03/11/2022  ?  7:45 AM 03/10/2022  ?  6:33 PM 03/10/2022  ?  2:54 PM  ?Last 3 Weights  ?Weight (lbs) 180 lb 12.4 oz 180 lb 12.4 oz 187 lb 6.3 oz  ?Weight (kg) 82 kg 82 kg 85 kg  ?  Body mass index is 32.02 kg/m?.  ?General: Well nourished, well developed, in no acute distress ?Head: Atraumatic, normal size  ?Eyes: PEERLA, EOMI  ?Neck: Supple, no JVD ?Endocrine: No thryomegaly ?Cardiac: Normal S1, S2; irregular rhythm, no murmurs rubs or gallops ?Lungs: Clear to auscultation bilaterally, no wheezing, rhonchi or rales  ?Abd: Soft, nontender, no hepatomegaly  ?Ext: No edema, pulses 2+ ?Musculoskeletal: No deformities, BUE and BLE strength normal and equal ?Skin: Warm and dry, no rashes   ?Neuro: Alert and oriented to person, place, time, and situation, CNII-XII grossly intact, no focal deficits  ?Psych: Normal mood and affect  ? ?Labs  ?High Sensitivity Troponin:   ?Recent Labs  ?Lab 02/28/22 ?2351 03/04/22 ?2041 03/04/22 ?2246 03/05/22 ?0323  ?TROPONINIHS 70* 99* 105* 181*  ?   ?Cardiac EnzymesNo results for input(s): TROPONINI in the last 168 hours. No results for input(s): TROPIPOC in the last 168 hours.  ?Chemistry ?Recent Labs  ?Lab 03/08/22 ?0045 03/09/22 ?0106 03/09/22 ?1416 03/10/22 ?1521 03/11/22 ?0041   ?NA 130* 128* 133* 129* 134*  ?K 4.0 4.5 4.2 4.8 3.8  ?CL 95* 93* 97* 93* 96*  ?CO2 23 19* 23 25 31   ?GLUCOSE 189* 136* 211* 209* 175*  ?BUN 43* 52* 18 29* 12  ?CREATININE 7.64* 9.70* 5.30* 8.08* 4.76*  ?CALCIUM 8.4* 8.6* 8.8* 9.1 9.0  ?PROT  --  5.6*  --   --   --   ?ALBUMIN 2.9* 3.2*  --  3.0*  --   ?AST  --  12*  --   --   --   ?ALT  --  17  --   --   --   ?ALKPHOS  --  57  --   --   --   ?BILITOT  --  0.7  --   --   --   ?GFRNONAA 5* 4* 8* 5* 9*  ?ANIONGAP 12 16* 13 11 7   ?  ?Hematology ?Recent Labs  ?Lab 03/09/22 ?1416 03/10/22 ?7591 03/11/22 ?0041  ?WBC 13.6* 12.7* 12.7*  ?RBC 3.97 3.41* 3.32*  ?HGB 11.3* 9.6* 9.3*  ?HCT 34.3* 29.7* 29.2*  ?MCV 86.4 87.1 88.0  ?MCH 28.5 28.2 28.0  ?MCHC 32.9 32.3 31.8  ?RDW 17.5* 17.6* 17.6*  ?PLT 193 189 183  ? ?BNPNo results for input(s): BNP, PROBNP in the last 168 hours.  ?DDimer No results for input(s): DDIMER in the last 168 hours.  ? ?Radiology  ?No results found. ? ?Cardiac Studies  ?TTE 03/05/2022 ? 1. Left ventricular ejection fraction, by estimation, is 55%%. The left  ?ventricle has low normal function. The left ventricle has no regional wall  ?motion abnormalities. There is mild left ventricular hypertrophy. Left  ?ventricular diastolic parameters  ?are indeterminate.  ? 2. Compared to prior study, RV dysfunction is new  ?    [name] Right ventricular systolic function moderate to severely decreased.  ?The right ventricular size is mildly enlarged. There is severely elevated  ?pulmonary artery systolic pressure.  ? 3. Left atrial size was mildly dilated.  ? 4. Right atrial size was mildly dilated.  ? 5. A small pericardial effusion is present.  ? 6. Mild mitral valve regurgitation.  ? 7. The aortic valve is tricuspid. Aortic valve regurgitation is not  ?visualized. Aortic valve sclerosis is present, with no  evidence of aortic  ?valve stenosis.  ? 8. The inferior vena cava is dilated in size with <50% respiratory  ?variability, suggesting right atrial pressure of 15 mmHg.   ? ?LHC 03/08/2022 ?1.  Successful atherectomy, PTCA, and stenting of severe stenosis in the mid LAD, reducing 90% calcific stenosis to 0%, TIMI-3 flow pre and post, with a 3.0 x 18 mm Onyx frontier DES ?2.  Successful atherectomy, PTCA, and stenting of severe stenosis in the mid RCA, reducing 95% calcific stenosis to 0%, TIMI-3 flow pre and post, with a 3.0 x 34 mm Onyx frontier DES ? ?Patient Profile  ?[name] is a 71 y.o. female with ESRD on hemodialysis, diabetes, hypertension, paroxysmal atrial fibrillation who was admitted on 03/04/2022 for unstable angina.  Also was in A-fib.  Has undergone PCI to the RCA and LAD. ? ?Assessment & Plan  ? ?#Unstable angina ?-Status post PCI to the mid LAD as well as mid RCA.  Doing well. ?-Due to A-fib plan is for Eliquis and Brilinta for 3 months and then transition to aspirin. ?-Currently on beta-blocker. ?-LVEF is normal. ?-On high intensity statin. ? ?#Paroxysmal A-fib ?-In A-fib this morning.  Continue calcium channel blocker and atenolol. ?-On Eliquis. ? ?#ESRD on hemodialysis ?-Per nephrology ? ?#Hypertension ?-Home agents ? ?[facility] will sign off.   ?Medication Recommendations: As above ?Other recommendations (labs, testing, etc): None ?Follow up as an outpatient: We will arrange outpatient follow-up in 1 to 2 weeks with Dr. Percival Spanish ? ?For questions or updates, please contact [place] ?Please consult [email or web address] for contact info under  ?   ?Signed, ?[place] T. Audie Box, MD, [facility] ?[place]  ?03/11/2022 9:10 AM  ? ?

## 2022-03-11 NOTE — Progress Notes (Signed)
?PROGRESS NOTE ? ? ? ?[name]  HUT:[id number] DOB: [birth year] DOA: 03/04/2022 ?PCP: [name], MD  ? ? ?Brief Narrative:  ? ?Patient is a 71 years old female with history of COPD, hypertension, diabetes mellitus, end-stage renal disease on hemodialysis presented to hospital with chest discomfort and new onset atrial fibrillation.  In the ED, patient was noted to have elevated troponin.  Patient underwent cardiac catheterization on 03/06/2022 which showed severe two-vessel disease involving RCA and LAD.  Patient was thought to be high risk for CT surgery so  underwent PCI on 03/08/2022. Nephrology following for dialysis needs. ?  ?Assessment and Plan: ? ?Principal Problem: ?  Atrial fibrillation with RVR ([place]) ?Active Problems: ?  Unstable angina ([place]) ?  End stage renal disease on dialysis due to type 2 diabetes mellitus ([place]) ?  Pulmonary hypertension ([place]) ?  Type II diabetes mellitus with nephropathy ([place]) ?  Hypertension associated with diabetes ([place]) ?  Hyperlipidemia associated with type 2 diabetes mellitus ([place]) ?  Obesity (BMI 30.0-34.9) ?  Leukocytosis ?  GERD (gastroesophageal reflux disease) ?  Hypokalemia ?  Physical deconditioning ?  Hyponatremia ?  ?* Atrial fibrillation with RVR ([place]) ?Rate controlled at this time.  Currently on aspirin atenolol and Eliquis, Long-acting Cardizem .  2D echocardiogram this time showed normal EF but severe pulmonary hypertension. TSH 4.7.  Cardiology following. ? ?Unstable angina ?Patient underwent cardiac catheterization on 03/06/2022 with  severe two-vessel disease involving LAD and RCA.  Patient subsequently underwent cardiac catheterization and coronary atherectomy, PCI and stenting of the severe stenosis in the mid LAD, RCA on 03/08/2022.  Continue aspirin, Brilinta, beta-blocker, statins, Eliquis..  Patient was thought to be high risk for surgery as per cardiothoracic surgery.  Plan is to continue Brilinta for 90 days followed by aspirin and Eliquis alone.   Patient will follow-up with cardiology in 1 to 2 weeks after discharge ? ?End stage renal disease on dialysis due to type 2 diabetes mellitus ([place]) ?Nephrology on board for hemodialysis need.  Tuesday Thursday Saturday schedule at baseline.  She never went to hemodialysis yesterday and again today..  Bleeding from the needle site and Eliquis will be initiated after bleeding settles down. ? ?Pulmonary hypertension ([place]) ?Does have history of COPD likely causing pulmonary hypertension.  RV systolic pressure was 61.   ? ?Type II diabetes mellitus with nephropathy ([place]) ?Patient uses 25 units of basal insulin at home has been reduced to 15 units sliding scale while in the hospital.  Continue gabapentin for neuropathy.  POC glucose of 165. ? ?Hypertension associated with diabetes ([place]) ?Continue atenolol, long-acting Cardizem.  Hypotensive during hemodialysis.  Cardiology suggesting holding atenolol on the morning of hemodialysis and  and using Cardizem in the evening postdialysis . ? ?Hypokalemia ?Replenished and adjusted during hemodialysis.  Latest potassium of 3.8. ? ?Hyponatremia.  On hemodialysis.  Has been started on torsemide as well.  Sodium level 134 today. ? ?GERD (gastroesophageal reflux disease) ?Continue Pepcid and Protonix. ? ?Leukocytosis ?Likely reactive.  Latest WBC at 12.7 from 16.8.  Afebrile.  No signs of infection at this time.  Chest x-ray without any acute infiltrate. ? ?Obesity (BMI 30.0-34.9) ?BMI 31.05.  Would benefit from weight loss as outpatient. ? ?Hyperlipidemia associated with type 2 diabetes mellitus ([place]) ?Continue statin ? ?Weakness and deconditioning.  Physical therapy has seen the patient and will consider home health on discharge. ? ? ? DVT prophylaxis: SCDs Start: 03/05/22 0239 ?apixaban (ELIQUIS) tablet 5 mg  ? ?Code  Status:   ?  Code Status: Full Code ? ?Disposition: Home likely by tomorrow when okay with nephrology.. ? ?Status is: Inpatient ? ?Remains inpatient appropriate  because: Status post PCI, hypotension during hemodialysis, medication adjustment, hemodialysis again today ? ? Family Communication:  ?Spoke with the patient at bedside. ? ?Consultants:  ?Cardiology ?Cardiothoracic surgery ?Nephrology ? ?Procedures:  ?Cardiac catheterization on 03/06/2022  ?Cardiac catheterization on 03/08/2022 with coronary atherectomy, PCI to mid LAD and RCA ? ?Antimicrobials:  ?None ? ?Anti-infectives (From admission, onward)  ? ? None  ? ?  ? ?Subjective: ?Today, patient was seen and examined at bedside.  Patient complains of mild chest discomfort and shortness of breath.  States that her weight has not been ideal despite dialysis. ? ?Objective: ?Vitals:  ? 03/11/22 1030 03/11/22 1100 03/11/22 1110 03/11/22 1234  ?BP: (!) 106/57 (!) 123/56 (!) 108/57 (!) 143/80  ?Pulse: 93 86 61 87  ?Resp: (!) 22 18 18 16   ?Temp:  (!) 97.3 ?F (36.3 ?C) (!) 97.3 ?F (36.3 ?C) 97.9 ?F (36.6 ?C)  ?TempSrc:    Oral  ?SpO2:   96% 97%  ?Weight:      ?Height:      ? ? ?Intake/Output Summary (Last 24 hours) at 03/11/2022 1538 ?Last data filed at 03/11/2022 1245 ?Gross per 24 hour  ?Intake 480 ml  ?Output 5500 ml  ?Net -5020 ml  ? ? ?Filed Weights  ? 03/10/22 1454 03/10/22 1833 03/11/22 0745  ?Weight: 85 kg 82 kg 82 kg  ? ? ?Physical Examination: ?Body mass index is 32.02 kg/m?.  ? ?General: Obese built, not in obvious distress, on nasal cannula oxygen ?HENT:   No scleral pallor or icterus noted. Oral mucosa is moist.  ?Chest:  .  Diminished breath sounds bilaterally.  ?CVS: S1 &S2 heard. No murmur.  Irregular rhythm. ?Abdomen: Soft, nontender, nondistended.  Bowel sounds are heard.   ?Extremities: No cyanosis, clubbing or edema.  Peripheral pulses are palpable.  Right AV fistula. ?Psych: Alert, awake and oriented, normal mood ?CNS:  No cranial nerve deficits.  Power equal in all extremities.   ?Skin: Warm and dry.  No rashes noted. ? ? ?Data Reviewed:  ? ?CBC: ?Recent Labs  ?Lab 03/05/22 ?0740 03/06/22 ?5701 03/08/22 ?0045  03/09/22 ?0106 03/09/22 ?1416 03/10/22 ?7793 03/11/22 ?0041  ?WBC 13.7*   < > 13.7* 16.8* 13.6* 12.7* 12.7*  ?NEUTROABS 11.3*  --   --   --   --   --   --   ?HGB 11.6*   < > 10.2* 11.2* 11.3* 9.6* 9.3*  ?HCT 34.7*   < > 31.4* 34.2* 34.3* 29.7* 29.2*  ?MCV 84.2   < > 86.7 86.1 86.4 87.1 88.0  ?PLT 223   < > 164 193 193 189 183  ? < > = values in this interval not displayed.  ? ? ? ?Basic Metabolic Panel: ?Recent Labs  ?Lab 03/07/22 ?0112 03/08/22 ?0045 03/09/22 ?0106 03/09/22 ?1416 03/10/22 ?9030 03/10/22 ?1521 03/11/22 ?0041  ?NA 131* 130* 128* 133*  --  129* 134*  ?K 3.6 4.0 4.5 4.2  --  4.8 3.8  ?CL 93* 95* 93* 97*  --  93* 96*  ?CO2 23 23 19* 23  --  25 31  ?GLUCOSE 103* 189* 136* 211*  --  209* 175*  ?BUN 66* 43* 52* 18  --  29* 12  ?CREATININE 10.20* 7.64* 9.70* 5.30*  --  8.08* 4.76*  ?CALCIUM 8.9 8.4* 8.6* 8.8*  --  9.1  9.0  ?MG  --   --  2.1  --  2.1  --  2.0  ?PHOS 8.5* 5.7*  --   --  4.6 5.3*  --   ? ? ? ?Liver Function Tests: ?Recent Labs  ?Lab 03/07/22 ?0112 03/08/22 ?0045 03/09/22 ?0106 03/10/22 ?1521  ?AST  --   --  12*  --   ?ALT  --   --  17  --   ?ALKPHOS  --   --  75  --   ?BILITOT  --   --  0.7  --   ?PROT  --   --  5.6*  --   ?ALBUMIN 3.1* 2.9* 3.2* 3.0*  ? ? ? ?Radiology Studies: ?No results found. ? ? ? LOS: 7 days  ? ? ?Flora Lipps, MD ?[facility] ?03/11/2022, 3:38 PM  ? ? ?

## 2022-03-11 NOTE — Progress Notes (Signed)
Pt returned from HD and upon assessment, RN noticed bleeding from fistula site. RN applied pressure with gauze and called HD. MD to bedside and HD RN to bedside. MD gave order to hold Brillinta and Eliquis. HD RN held pressure at site of bleeding. Pt no longer bleeding. This RN continuing to monitor. ?

## 2022-03-11 NOTE — Progress Notes (Signed)
[facility] ?Progress Note ? ?Subjective: 3 L off w/ HD yesterday. Stable BP's. Going to HD today.  ? ?Vitals:  ? 03/10/22 1830 03/10/22 1833 03/10/22 2325 03/11/22 0418  ?BP: (!) 103/50 121/62 (!) 145/61 (!) 155/65  ?Pulse: 69 79 84 82  ?Resp:  18 20 16   ?Temp:  98.1 ?F (36.7 ?C) 98 ?F (36.7 ?C) 98.7 ?F (37.1 ?C)  ?TempSrc:  Oral Oral Oral  ?SpO2:  100% 98% 98%  ?Weight:  82 kg    ?Height:      ? ? ?Exam: ? Gen alert, no distress, 2L O2 [place] ?No jvd or bruits ?Chest clear L, faint rales R base ?RRR no RG ?Abd soft ntnd no mass or ascites +bs ?Ext no sig LE edema ?Neuro is alert, Ox 3 , nf ? RUA aVF+bruit ?  ? ?  4/1 CXR - no acute disease ? ? ? OP HD: [facility] ?  4h   84kg   350/500  RUE AVF  Heparin 1000u/hr ? - 4/04 > hep B Ag neg, and hep B Abs were low/ not protective ? - rocaltrol 1.75 ug tiw ? - mircera 50 ug q 4 wks ? - venofer 50mg  weekly ? ? ?Assessment/ Plan: ?Chest pain: presented in a.fib with RVR. On diltiazem. Rate controlled at present. Management per primary/cardiology. SP LHC which showed severe 2V CAD. Seen by TCTS, too high risk for surgery. Had atherectomy/ PCI to LAD and RCA lesions on 4/05. Per cards/ pmd.  ? ESRD:  on HD TTS. HD today on schedule.  ? Hypertension/volume: BP moderately elevated. On diltiazem and atenolol, agree w/ holding atenolol the morning of dialysis and the prior evening. Agree w/ diltiazem to be given qhs daily. LVEDP from cath 4/03 was high at 30 mmHg. SOB here, had extra HD yesterday w/ 3 L off and stable BP's. HD again today, lower vol as tolerated. Hopefully can be dc'd after HD today.  ? Anemia: Hgb 9-12 here. No esa needs.  ? Metabolic bone disease: Calcium controlled. Phos down. Continue calcium acetate. Cont rocaltrol.  ? ? ? ? ?Rob Doctor, hospital ?03/11/2022, 4:20 AM ? ? ?Recent Labs  ?Lab 03/09/22 ?0106 03/09/22 ?1416 03/10/22 ?3382 03/10/22 ?1521 03/11/22 ?0041  ?HGB 11.2*   < > 9.6*  --  9.3*  ?ALBUMIN 3.2*  --   --  3.0*  --   ?CALCIUM 8.6*   < >  --   9.1 9.0  ?PHOS  --   --  4.6 5.3*  --   ?CREATININE 9.70*   < >  --  8.08* 4.76*  ?K 4.5   < >  --  4.8 3.8  ? < > = values in this interval not displayed.  ? ? ?Inpatient medications: ? apixaban  5 mg Oral BID  ? atenolol  50 mg Oral Once per day on Mon Wed Fri Sat  ? atenolol  50 mg Oral Once per day on Sun Tue Thu Sat  ? busPIRone  15 mg Oral BID  ? calcium acetate  1,334 mg Oral TID with meals  ? diltiazem  180 mg Oral QPM  ? docusate sodium  100 mg Oral BID  ? famotidine  20 mg Oral QHS  ? gabapentin  400 mg Oral Q T,Th,Sat-1800  ? insulin aspart  0-5 Units Subcutaneous QHS  ? insulin aspart  0-6 Units Subcutaneous TID WC  ? insulin detemir  15 Units Subcutaneous QHS  ? pantoprazole  40 mg Oral  Daily  ? polyethylene glycol  17 g Oral Daily  ? QUEtiapine  100 mg Oral QHS  ? rOPINIRole  1 mg Oral QHS  ? rosuvastatin  5 mg Oral Daily  ? sodium chloride flush  3 mL Intravenous Q12H  ? sodium chloride flush  3 mL Intravenous Q12H  ? ticagrelor  90 mg Oral BID  ? torsemide  20 mg Oral Daily  ? ? sodium chloride    ? ?sodium chloride, acetaminophen **OR** acetaminophen, calcium acetate, hydrOXYzine, levalbuterol, morphine (PF), nitroGLYCERIN, oxyCODONE, promethazine, sodium chloride flush ? ? ? ? ? ? ? ?

## 2022-03-12 DIAGNOSIS — E1122 Type 2 diabetes mellitus with diabetic chronic kidney disease: Secondary | ICD-10-CM | POA: Diagnosis not present

## 2022-03-12 DIAGNOSIS — K219 Gastro-esophageal reflux disease without esophagitis: Secondary | ICD-10-CM | POA: Diagnosis not present

## 2022-03-12 DIAGNOSIS — N186 End stage renal disease: Secondary | ICD-10-CM

## 2022-03-12 DIAGNOSIS — I4891 Unspecified atrial fibrillation: Secondary | ICD-10-CM | POA: Diagnosis not present

## 2022-03-12 DIAGNOSIS — T82838A Hemorrhage of vascular prosthetic devices, implants and grafts, initial encounter: Secondary | ICD-10-CM

## 2022-03-12 DIAGNOSIS — E1169 Type 2 diabetes mellitus with other specified complication: Secondary | ICD-10-CM | POA: Diagnosis not present

## 2022-03-12 DIAGNOSIS — Z992 Dependence on renal dialysis: Secondary | ICD-10-CM

## 2022-03-12 LAB — CBC
HCT: 28.5 % — ABNORMAL LOW (ref 36.0–46.0)
Hemoglobin: 9.1 g/dL — ABNORMAL LOW (ref 12.0–15.0)
MCH: 28.4 pg (ref 26.0–34.0)
MCHC: 31.9 g/dL (ref 30.0–36.0)
MCV: 89.1 fL (ref 80.0–100.0)
Platelets: 180 10*3/uL (ref 150–400)
RBC: 3.2 MIL/uL — ABNORMAL LOW (ref 3.87–5.11)
RDW: 17.5 % — ABNORMAL HIGH (ref 11.5–15.5)
WBC: 11.5 10*3/uL — ABNORMAL HIGH (ref 4.0–10.5)
nRBC: 0 % (ref 0.0–0.2)

## 2022-03-12 LAB — GLUCOSE, CAPILLARY
Glucose-Capillary: 164 mg/dL — ABNORMAL HIGH (ref 70–99)
Glucose-Capillary: 264 mg/dL — ABNORMAL HIGH (ref 70–99)
Glucose-Capillary: 130 mg/dL — ABNORMAL HIGH (ref 70–99)
Glucose-Capillary: 208 mg/dL — ABNORMAL HIGH (ref 70–99)

## 2022-03-12 NOTE — Consult Note (Signed)
?Hospital Consult ? ? ? ?Reason for Consult: Prolonged bleeding status post dialysis ?Requesting Physician: Hospital medicine ?MRN #:  [id number] ? ?History of Present Illness: This is a 71 y.o. female with history of end-stage renal disease, diabetes, hypertension, CAD with severe two-vessel disease originally admitted with chest pain.  She was too high risk for DT surgery and underwent PCI 4 days ago. ? ?At her most recent dialysis visit, [name] had prolonged bleeding lasting roughly 1 hour.  At the time of consult yesterday, this had ceased.  Vascular surgery was called due to fistula malfunction. ? ?On exam, [name] was doing well.  She had no complaints.  She denied chest pain she noted multiple fistulogram's in the past with an area of rebound stenosis.  She denied symptoms of steal syndrome.  No spontaneous bleeding events. ? ?Past Medical History:  ?Diagnosis Date  ? Acute on chronic diastolic CHF (congestive heart failure) ([place]) 03/05/2015  ? Grade 2. EF 60-65%  ? Anemia   ? hx of  ? Anxiety   ? Arthritis   ? CAD (coronary artery disease)   ? s/p Stenting in 2005;  [place] 7/09: Vigorous LV function, 2-3+ MR, RI 40%, RI stent patent, proximal-mid RCA 40-50%;  [place] 7/09:  EF 55-65%, trivial MR;  [place] 11/18/12: EF 66%, normal LV wall motion, mild to moderate anterior ischemia - reviewed by [facility] and felt to rep breast atten (low risk);  E[place] 6/14: mild LVH, EF 55-65%, Gr 2 DD, PASP 44, trivial eff   ? Chronic kidney disease   ? CREATININE IS UP--GOING TO KIDNEY MD   ? Chronic neck pain   ? COPD (chronic obstructive pulmonary disease) ([place])   ? Degenerative joint disease   ? left shoulder  ? Diabetic neuropathy ([place])   ? Diverticulosis   ? Esophageal stricture   ? GERD (gastroesophageal reflux disease)   ? Hyperlipidemia   ? Hypertension   ? IDDM (insulin dependent diabetes mellitus)   ? Pericardial effusion 03/06/2015  ? Very small per echo ; pleural nodules seen on CT chest.  ? Peripheral vascular disease ([place])   ?  Stroke [facility])   ? "mini- years ago"  ? Vitamin D deficiency   ? ? ?Past Surgical History:  ?Procedure Laterality Date  ? A/V FISTULAGRAM N/A 08/10/2017  ? Procedure: A/V Fistulagram - Right Arm;  Surgeon: Elam Dutch, MD;  Location: [place] [facility];  Service: Cardiovascular;  Laterality: N/A;  ? A/V FISTULAGRAM N/A 03/03/2020  ? Procedure: A/V FISTULAGRAM - Right Arm;  Surgeon: Marty Heck, MD;  Location: [place] [facility];  Service: Cardiovascular;  Laterality: N/A;  ? ABDOMINAL HYSTERECTOMY    ? AV FISTULA PLACEMENT Right 07/20/2015  ? Procedure: RADIAL CEPHALIC ARTERIOVENOUS FISTULA CREATION RIGHT ARM;  Surgeon: Angelia Mould, MD;  Location: [place];  Service: Vascular;  Laterality: Right;  ? AV FISTULA PLACEMENT Right 11/26/2015  ? Procedure:  Creation of RIGHT BRACHIO-CEPHALIC arteriovenous fistula;  Surgeon: Angelia Mould, MD;  Location: [place];  Service: Vascular;  Laterality: Right;  ? BACK SURGERY    ? 2007 & 2013  ? BLADDER SURGERY    ? Tack  ? CARDIAC CATHETERIZATION  2003, 2005, 2009  ? 1 stent  ? cardiac stents    ? CATARACT EXTRACTION W/PHACO Right 05/04/2014  ? Procedure: CATARACT EXTRACTION PHACO AND INTRAOCULAR LENS PLACEMENT (IOC);  Surgeon: Williams Che, MD;  Location: [facility];  Service: Ophthalmology;  Laterality: Right;  CDE  1.47  ? CATARACT EXTRACTION W/PHACO Left 07/20/2014  ? Procedure: CATARACT EXTRACTION WITH PHACO AND INTRAOCULAR LENS PLACEMENT LEFT EYE CDE=1.79;  Surgeon: Williams Che, MD;  Location: [facility];  Service: Ophthalmology;  Laterality: Left;  ? CERVICAL SPINE SURGERY  2012  ? 8 screws  ? CORONARY ATHERECTOMY N/A 03/08/2022  ? Procedure: CORONARY ATHERECTOMY;  Surgeon: Sherren Mocha, MD;  Location: [place] [facility];  Service: Cardiovascular;  Laterality: N/A;  ? CYSTOSCOPY WITH INJECTION N/A 06/03/2013  ? Procedure: MACROPLASTIQUE  INJECTION ;  Surgeon: Malka So, MD;  Location: [facility];  Service: Urology;  Laterality: N/A;  ? FISTULOGRAM Right  10/20/2016  ? Procedure: FISTULOGRAM WITH POSSIBLE INTERVENTION;  Surgeon: [name], MD;  Location: [facility];  Service: Vascular;  Laterality: Right;  ? INSERTION OF DIALYSIS CATHETER N/A 11/26/2015  ? Procedure: INSERTION OF DIALYSIS CATHETER;  Surgeon: Angelia Mould, MD;  Location: [place];  Service: Vascular;  Laterality: N/A;  ? INTRAVASCULAR IMAGING/OCT N/A 03/08/2022  ? Procedure: INTRAVASCULAR IMAGING/OCT;  Surgeon: Sherren Mocha, MD;  Location: [place] [facility];  Service: Cardiovascular;  Laterality: N/A;  ? LAMINECTOMY  12/29/2011  ? LEFT HEART CATH AND CORONARY ANGIOGRAPHY N/A 03/06/2022  ? Procedure: LEFT HEART CATH AND CORONARY ANGIOGRAPHY;  Surgeon: Nelva Bush, MD;  Location: [place] [facility];  Service: Cardiovascular;  Laterality: N/A;  ? LUMBAR LAMINECTOMY/DECOMPRESSION MICRODISCECTOMY  12/29/2011  ? Procedure: LUMBAR LAMINECTOMY/DECOMPRESSION MICRODISCECTOMY;  Surgeon: Floyce Stakes, MD;  Location: [place] [facility];  Service: Neurosurgery;  Laterality: N/A;  Lumbar Two through Lumbar Five Laminectomies/Cellsaver  ? PARTIAL HYSTERECTOMY    ? PERIPHERAL VASCULAR BALLOON ANGIOPLASTY  03/03/2020  ? Procedure: PERIPHERAL VASCULAR BALLOON ANGIOPLASTY;  Surgeon: Marty Heck, MD;  Location: [place] [facility];  Service: Cardiovascular;;  Rt arm fistula  ? PERIPHERAL VASCULAR BALLOON ANGIOPLASTY Right 04/15/2021  ? Procedure: PERIPHERAL VASCULAR BALLOON ANGIOPLASTY;  Surgeon: Angelia Mould, MD;  Location: [place] [facility];  Service: Cardiovascular;  Laterality: Right;  arm fistula  ? PERIPHERAL VASCULAR CATHETERIZATION N/A 11/22/2015  ? Procedure: Fistulagram;  Surgeon: Angelia Mould, MD;  Location: [place] [facility];  Service: Cardiovascular;  Laterality: N/A;  ? PERIPHERAL VASCULAR INTERVENTION  08/10/2017  ? Procedure: PERIPHERAL VASCULAR INTERVENTION;  Surgeon: Elam Dutch, MD;  Location: [place] [facility];  Service: Cardiovascular;;  ? SHOULDER SURGERY  Right   ? Rotator cuff  ? ? ?Allergies  ?Allergen Reactions  ? Azithromycin   ?  Prolonged QT  ? Ace Inhibitors Cough  ? Clopidogrel Bisulfate Other (See Comments)  ?  Sick, Headache, "Felt terrible"  ? Codeine Other (See Comments)  ?  hallucinations  ? Lisinopril Cough  ? Penicillins Other (See Comments)  ?  Unknown reaction ?Did it involve swelling of the face/tongue/throat, SOB, or low BP? Unknown ?Did it involve sudden or severe rash/hives, skin peeling, or any reaction on the inside of your mouth or nose? Unknown ?Did you need to seek medical attention at a hospital or doctor's office? Unknown ?When did it last happen?      childhood allergy ?If all above answers are "NO", may proceed with cephalosporin use. ? ?  ? ? ?Prior to Admission medications   ?Medication Sig Start Date End Date Taking? Authorizing Provider  ?acetaminophen (TYLENOL) 500 MG tablet Take 1,000 mg by mouth every 6 (six) hours as needed for moderate pain or headache.   Yes [provider]  ?albuterol (PROVENTIL) (2.5 MG/3ML)  0.083% nebulizer solution Take 3 mLs (2.5 mg total) by nebulization every 6 (six) hours as needed for wheezing or shortness of breath. 12/02/21  Yes [name], MD  ?albuterol (VENTOLIN HFA) 108 (90 Base) MCG/ACT inhaler Inhale 2 puffs into the lungs every 6 (six) hours as needed for wheezing or shortness of breath. 12/02/21  Yes [name], MD  ?aspirin 81 MG EC tablet Take 81 mg by mouth daily.   Yes [provider]  ?budesonide-formoterol (SYMBICORT) 160-4.5 MCG/ACT inhaler Inhale 2 puffs into the lungs 2 (two) times daily. 11/15/21  Yes [name], MD  ?busPIRone (BUSPAR) 15 MG tablet Take 1 tablet (15 mg total) by mouth 2 (two) times daily. 12/12/21  Yes [name], MD  ?calcium acetate (PHOSLO) 667 MG capsule Take 667-1,334 mg by mouth See admin instructions. Take 1334 mg with meals 3 times daily and 667 mg with snacks   Yes [provider]  ?Carboxymethylcellul-Glycerin  (LUBRICATING EYE DROPS OP) Place 1 drop into both eyes daily as needed (dry eyes).   Yes [provider]  ?cetirizine (ZYRTEC) 10 MG tablet Take 1 tablet (10 mg total) by mouth daily as needed for [facility]

## 2022-03-12 NOTE — Progress Notes (Addendum)
[facility] ?Progress Note ? ?Subjective: prolonged bleeding after HD yesterday. 2.5 L UF w/ HD yest. Pt says the surgeon is going to do a procedure on Tuesday so she is not going home.  ? ?Vitals:  ? 03/11/22 2300 03/12/22 0000 03/12/22 0340 03/12/22 0400  ?BP: 127/73 (!) 144/60 (!) 133/59 119/67  ?Pulse:   85   ?Resp: 12 15 14 13   ?Temp:   97.9 ?F (36.6 ?C)   ?TempSrc:   Oral   ?SpO2:   98%   ?Weight:      ?Height:      ? ? ?Exam: ? Gen alert, no distress, 2L O2 [place] ?No jvd or bruits ?Chest clear L, faint rales R base ?RRR no RG ?Abd soft ntnd no mass or ascites +bs ?Ext no sig LE edema ?Neuro is alert, Ox 3 , nf ? RUA aVF+bruit ?  ? ?  4/1 CXR - no acute disease ? Home meds - asa, symbicort, buspar, phoslo, pepcid, neurontin 400 qd, insulin glargine, procardia xl, sl ntg, prilosec, seroquel, crestor, semaglutide, demadex 20 qd, atenolol 50 bid, requip ? ? OP HD: [facility] ?  4h   84kg   350/500  RUE AVF  Heparin 1000u/hr ? - 4/04 > hep B Ag neg, and hep B Abs were low/ not protective ? - rocaltrol 1.75 ug tiw ? - mircera 50 ug q 4 wks ? - venofer 50mg  weekly ? ? ?Assessment/ Plan: ?Chest pain: presented in a.fib with RVR. On diltiazem. Rate controlled at present. Management per primary/cardiology. SP LHC which showed severe 2V CAD. Seen by TCTS, too high risk for surgery. Had atherectomy/ PCI to tight LAD and RCA lesions on 4/05. Added brilinta post cath.  ? ESRD:  on HD TTS. Plan next HD Tuesday. Schedule around fistulogram. ? Bleeding - prolonged access bleeding after HD Sat, likely related to new brilinta and eliquis +/- other. VVS consulting, plan procedure on 4/11.  ? Hypertension/volume: BPs okay. Diltiazem added for afib. Given BP drops on dialysis, cardiology recommended special dosing > holding atenolol the morning of dialysis and the prior evening, and give the long-acting po diltiazem every evening. Seems to have helped w/ BP's in HD here. LVEDP from cath 4/03 was up at 30 mmHg. Did  extra HD this past week and new edw should be around 82kg or possibly lower.  ?Afib w/ RVR - new onset here, now on eliquis, and diltiazem added to atenolol. Per cards.  ? Anemia: Hgb 9-12 here. No esa needs.  ? Metabolic bone disease: Calcium controlled. Phos down. Continue calcium acetate. Cont rocaltrol.  ? ? ? ? ?Rob Doctor, hospital ?03/12/2022, 4:47 AM ? ? ?Recent Labs  ?Lab 03/09/22 ?0106 03/09/22 ?1416 03/10/22 ?7681 03/10/22 ?1521 03/11/22 ?0041 03/12/22 ?1572  ?HGB 11.2*   < > 9.6*  --  9.3* 9.1*  ?ALBUMIN 3.2*  --   --  3.0*  --   --   ?CALCIUM 8.6*   < >  --  9.1 9.0  --   ?PHOS  --   --  4.6 5.3*  --   --   ?CREATININE 9.70*   < >  --  8.08* 4.76*  --   ?K 4.5   < >  --  4.8 3.8  --   ? < > = values in this interval not displayed.  ? ? ?Inpatient medications: ? apixaban  5 mg Oral BID  ? atenolol  50 mg Oral Once per day on  Mon Wed Fri Sat  ? atenolol  50 mg Oral Once per day on Sun Tue Thu Sat  ? busPIRone  15 mg Oral BID  ? calcium acetate  1,334 mg Oral TID with meals  ? diltiazem  180 mg Oral QPM  ? docusate sodium  100 mg Oral BID  ? famotidine  20 mg Oral QHS  ? gabapentin  400 mg Oral Q T,Th,Sat-1800  ? insulin aspart  0-5 Units Subcutaneous QHS  ? insulin aspart  0-6 Units Subcutaneous TID WC  ? insulin detemir  15 Units Subcutaneous QHS  ? pantoprazole  40 mg Oral Daily  ? polyethylene glycol  17 g Oral Daily  ? QUEtiapine  100 mg Oral QHS  ? rOPINIRole  1 mg Oral QHS  ? rosuvastatin  5 mg Oral Daily  ? sodium chloride flush  3 mL Intravenous Q12H  ? sodium chloride flush  3 mL Intravenous Q12H  ? ticagrelor  90 mg Oral BID  ? torsemide  20 mg Oral Daily  ? ? sodium chloride    ? ?sodium chloride, acetaminophen **OR** acetaminophen, calcium acetate, hydrOXYzine, levalbuterol, morphine (PF), nitroGLYCERIN, oxyCODONE, promethazine, sodium chloride flush ? ? ? ? ? ? ? ?

## 2022-03-12 NOTE — Progress Notes (Signed)
?PROGRESS NOTE ? ? ? ?[name]  HER:[id number] DOB: [birth year] DOA: 03/04/2022 ?PCP: [name], MD  ? ? ?Brief Narrative:  ? ?Patient is a 71 years old female with history of COPD, hypertension, diabetes mellitus, end-stage renal disease on hemodialysis presented to hospital with chest discomfort and new onset atrial fibrillation.  In the ED, patient was noted to have elevated troponin.  Patient underwent cardiac catheterization on 03/06/2022 which showed severe two-vessel disease involving RCA and LAD.  Patient was thought to be high risk for CT surgery so  underwent PCI on 03/08/2022. Nephrology following for dialysis needs. ?  ?Assessment and Plan: ? ?Principal Problem: ?  Atrial fibrillation with RVR ([place]) ?Active Problems: ?  Unstable angina ([place]) ?  End stage renal disease on dialysis due to type 2 diabetes mellitus ([place]) ?  Pulmonary hypertension ([place]) ?  Type II diabetes mellitus with nephropathy ([place]) ?  Hypertension associated with diabetes ([place]) ?  Hyperlipidemia associated with type 2 diabetes mellitus ([place]) ?  Obesity (BMI 30.0-34.9) ?  Leukocytosis ?  GERD (gastroesophageal reflux disease) ?  Hypokalemia ?  Physical deconditioning ?  Hyponatremia ?  ?* Atrial fibrillation with RVR ([place]) ?Rate controlled at this time.  Currently on aspirin, atenolol and Eliquis, Long-acting Cardizem .  2D echocardiogram this time showed normal EF but severe pulmonary hypertension. TSH 4.7.  Cardiology following. ? ?Unstable angina ?Patient underwent cardiac catheterization on 03/06/2022 with  severe two-vessel disease involving LAD and RCA.  Patient subsequently underwent cardiac catheterization and coronary atherectomy, PCI and stenting of the severe stenosis in the mid LAD, RCA on 03/08/2022.  Continue aspirin, Brilinta, beta-blocker, statins, Eliquis..  Patient was thought to be high risk for surgery as per cardiothoracic surgery.  Plan is to continue Brilinta for 90 days followed by aspirin and Eliquis alone.   Patient will follow-up with cardiology in 1 to 2 weeks after discharge ? ?End stage renal disease on dialysis due to type 2 diabetes mellitus ([place]) ?Nephrology on board for hemodialysis need.  Tuesday Thursday Saturday schedule at baseline.     ? ?Fistula site bleeding.  Seen by vascular surgery.  Plan for fistulogram and potential vascular procedure on 03/14/2022. ? ?Pulmonary hypertension ([place]) ?Does have history of COPD likely causing pulmonary hypertension.  RV systolic pressure was 61.   ? ?Type II diabetes mellitus with nephropathy ([place]) ?Patient uses 25 units of basal insulin at home has been reduced to 15 units sliding scale while in the hospital.  Continue gabapentin for neuropathy.  POC glucose of 165. ? ?Hypertension associated with diabetes ([place]) ?Continue atenolol, long-acting Cardizem.  Hypotensive during hemodialysis.  Cardiology suggesting holding atenolol on the morning of hemodialysis and  and using Cardizem in the evening postdialysis . ? ?Hypokalemia ?Replenished and adjusted during hemodialysis.  Latest potassium of 3.8. ? ?Hyponatremia.  On hemodialysis.  Has been started on torsemide as well.  Sodium level 134 today. ? ?GERD (gastroesophageal reflux disease) ?Continue Pepcid and Protonix. ? ?Leukocytosis ?Likely reactive.  Latest WBC at 12.7 from 16.8.  Afebrile.  No signs of infection at this time.  Chest x-ray without any acute infiltrate. ? ?Obesity (BMI 30.0-34.9) ?BMI 31.05.  Would benefit from weight loss as outpatient. ? ?Hyperlipidemia associated with type 2 diabetes mellitus ([place]) ?Continue statin ? ?Weakness and deconditioning.  Physical therapy has seen the patient and will consider home health on discharge. ? ? DVT prophylaxis: SCDs Start: 03/05/22 0239 ?apixaban (ELIQUIS) tablet 5 mg  ? ?Code Status:   ?  Code Status: Full Code ? ?Disposition: Home  ? ?Status is: Inpatient ? ?Remains inpatient appropriate because: Status post PCI, hy hemodialysis, need for fistulogram for  03/14/2022 ? ? Family Communication:  ?Spoke with the patient at bedside. ? ?Consultants:  ?Cardiology ?Cardiothoracic surgery ?Nephrology ? ?Procedures:  ?Cardiac catheterization on 03/06/2022  ?Cardiac catheterization on 03/08/2022 with coronary atherectomy, PCI to mid LAD and RCA ? ?Antimicrobials:  ?None ? ?Anti-infectives (From admission, onward)  ? ? None  ? ?  ? ?Subjective: ?Today, patient was seen and examined.  Denies any shortness of breath chest pain palpitation nausea or vomiting. ? ?Objective: ?Vitals:  ? 03/12/22 0340 03/12/22 0400 03/12/22 0714 03/12/22 1224  ?BP: (!) 133/59 119/67 117/83 132/65  ?Pulse: 85  72 72  ?Resp: 14 13 17 18   ?Temp: 97.9 ?F (36.6 ?C)  98.1 ?F (36.7 ?C) 97.7 ?F (36.5 ?C)  ?TempSrc: Oral  Oral Oral  ?SpO2: 98%  99% 99%  ?Weight:      ?Height:      ? ?No intake or output data in the 24 hours ending 03/12/22 1353 ? ?Filed Weights  ? 03/10/22 1833 03/11/22 0745 03/11/22 1110  ?Weight: 82 kg 82 kg 82.9 kg  ? ? ?Physical Examination: ?Body mass index is 32.37 kg/m?.  ? ?General: Obese built, not in obvious distress, on nasal cannula oxygen ?HENT:   No scleral pallor or icterus noted. Oral mucosa is moist.  ?Chest:    Diminished breath sounds bilaterally. No crackles or wheezes.  ?CVS: S1 &S2 heard. No murmur.  Regular rate and rhythm. ?Abdomen: Soft, nontender, nondistended.  Bowel sounds are heard.   ?Extremities: No cyanosis, clubbing or edema.  Peripheral pulses are palpable.  Right AV fistula. ?Psych: Alert, awake and oriented, normal mood ?CNS:  No cranial nerve deficits.  Power equal in all extremities.   ?Skin: Warm and dry.  No rashes noted. ? ?Data Reviewed:  ? ?CBC: ?Recent Labs  ?Lab 03/09/22 ?0106 03/09/22 ?1416 03/10/22 ?3474 03/11/22 ?0041 03/12/22 ?2595  ?WBC 16.8* 13.6* 12.7* 12.7* 11.5*  ?HGB 11.2* 11.3* 9.6* 9.3* 9.1*  ?HCT 34.2* 34.3* 29.7* 29.2* 28.5*  ?MCV 86.1 86.4 87.1 88.0 89.1  ?PLT 193 193 189 183 180  ? ? ? ?Basic Metabolic Panel: ?Recent Labs  ?Lab  03/07/22 ?0112 03/08/22 ?0045 03/09/22 ?0106 03/09/22 ?1416 03/10/22 ?6387 03/10/22 ?1521 03/11/22 ?0041  ?NA 131* 130* 128* 133*  --  129* 134*  ?K 3.6 4.0 4.5 4.2  --  4.8 3.8  ?CL 93* 95* 93* 97*  --  93* 96*  ?CO2 23 23 19* 23  --  25 31  ?GLUCOSE 103* 189* 136* 211*  --  209* 175*  ?BUN 66* 43* 52* 18  --  29* 12  ?CREATININE 10.20* 7.64* 9.70* 5.30*  --  8.08* 4.76*  ?CALCIUM 8.9 8.4* 8.6* 8.8*  --  9.1 9.0  ?MG  --   --  2.1  --  2.1  --  2.0  ?PHOS 8.5* 5.7*  --   --  4.6 5.3*  --   ? ? ? ?Liver Function Tests: ?Recent Labs  ?Lab 03/07/22 ?0112 03/08/22 ?0045 03/09/22 ?0106 03/10/22 ?1521  ?AST  --   --  12*  --   ?ALT  --   --  17  --   ?ALKPHOS  --   --  31  --   ?BILITOT  --   --  0.7  --   ?PROT  --   --  5.6*  --   ?ALBUMIN 3.1* 2.9* 3.2* 3.0*  ? ? ? ?Radiology Studies: ?No results found. ? ? ? LOS: 8 days  ? ? ?Flora Lipps, MD ?[facility] ?03/12/2022, 1:53 PM  ? ? ?

## 2022-03-13 ENCOUNTER — Inpatient Hospital Stay (HOSPITAL_COMMUNITY): Payer: Medicare Other

## 2022-03-13 ENCOUNTER — Ambulatory Visit: Payer: Medicare Other | Admitting: Family Medicine

## 2022-03-13 DIAGNOSIS — I4891 Unspecified atrial fibrillation: Secondary | ICD-10-CM | POA: Diagnosis not present

## 2022-03-13 DIAGNOSIS — K219 Gastro-esophageal reflux disease without esophagitis: Secondary | ICD-10-CM | POA: Diagnosis not present

## 2022-03-13 DIAGNOSIS — E1169 Type 2 diabetes mellitus with other specified complication: Secondary | ICD-10-CM | POA: Diagnosis not present

## 2022-03-13 DIAGNOSIS — E1122 Type 2 diabetes mellitus with diabetic chronic kidney disease: Secondary | ICD-10-CM | POA: Diagnosis not present

## 2022-03-13 LAB — CBC
HCT: 28.3 % — ABNORMAL LOW (ref 36.0–46.0)
Hemoglobin: 9.1 g/dL — ABNORMAL LOW (ref 12.0–15.0)
MCH: 28.8 pg (ref 26.0–34.0)
MCHC: 32.2 g/dL (ref 30.0–36.0)
MCV: 89.6 fL (ref 80.0–100.0)
Platelets: 197 10*3/uL (ref 150–400)
RBC: 3.16 MIL/uL — ABNORMAL LOW (ref 3.87–5.11)
RDW: 17.2 % — ABNORMAL HIGH (ref 11.5–15.5)
WBC: 10.2 10*3/uL (ref 4.0–10.5)
nRBC: 0 % (ref 0.0–0.2)

## 2022-03-13 LAB — BASIC METABOLIC PANEL
Anion gap: 9 (ref 5–15)
BUN: 23 mg/dL (ref 8–23)
CO2: 28 mmol/L (ref 22–32)
Calcium: 9.4 mg/dL (ref 8.9–10.3)
Chloride: 95 mmol/L — ABNORMAL LOW (ref 98–111)
Creatinine, Ser: 7.04 mg/dL — ABNORMAL HIGH (ref 0.44–1.00)
GFR, Estimated: 6 mL/min — ABNORMAL LOW (ref >60)
Glucose, Bld: 159 mg/dL — ABNORMAL HIGH (ref 70–99)
Potassium: 4 mmol/L (ref 3.5–5.1)
Sodium: 132 mmol/L — ABNORMAL LOW (ref 135–145)

## 2022-03-13 LAB — GLUCOSE, CAPILLARY
Glucose-Capillary: 140 mg/dL — ABNORMAL HIGH (ref 70–99)
Glucose-Capillary: 205 mg/dL — ABNORMAL HIGH (ref 70–99)
Glucose-Capillary: 212 mg/dL — ABNORMAL HIGH (ref 70–99)
Glucose-Capillary: 173 mg/dL — ABNORMAL HIGH (ref 70–99)

## 2022-03-13 MED ORDER — SENNOSIDES-DOCUSATE SODIUM 8.6-50 MG PO TABS
1.0000 | ORAL_TABLET | Freq: Two times a day (BID) | ORAL | Status: DC
  Administered 2022-03-13 – 2022-03-15 (×3): 1 via ORAL
  Filled 2022-03-13 (×4): qty 1

## 2022-03-13 MED ORDER — BISACODYL 5 MG PO TBEC: 5.0000 mg | DELAYED_RELEASE_TABLET | Freq: Every day | ORAL | Status: DC | PRN

## 2022-03-13 NOTE — Progress Notes (Signed)
?[facility] ?Progress Note  ? ?Assessment/ Plan:   ? OP HD: [facility] ?  4h   84kg   350/500  RUE AVF  Heparin 1000u/hr ? - 4/04 > hep B Ag neg, and hep B Abs were low/ not protective ? - rocaltrol 1.75 ug tiw ? - mircera 50 ug q 4 wks ? - venofer 50mg  weekly ?  ?  ?Assessment/ Plan: ?Chest pain: presented in a.fib with RVR. On diltiazem. Rate controlled at present. Management per primary/cardiology. SP LHC which showed severe 2V CAD. Seen by TCTS, too high risk for surgery. Had atherectomy/ PCI to tight LAD and RCA lesions on 4/05. Added brilinta post cath.  ? ESRD:  on HD TTS. Plan next HD Tuesday 03/14/22- will do second shift so we can do fgram first.  ? Bleeding - prolonged access bleeding after HD Sat, likely related to new brilinta and eliquis +/- other. VVS consulting, plan procedure on 4/11.  ? Hypertension/volume: BPs okay. Diltiazem added for afib. Given BP drops on dialysis, cardiology recommended special dosing > holding atenolol the morning of dialysis and the prior evening, and give the long-acting po diltiazem every evening. Seems to have helped w/ BP's in HD here. LVEDP from cath 4/03 was up at 30 mmHg. Did extra HD this past week and new edw should be around 82kg or possibly lower.  ?Afib w/ RVR - new onset here, now on eliquis, and diltiazem added to atenolol. Per cards.  ? Anemia: Hgb 9-12 here. No esa needs.  ? Metabolic bone disease: Calcium controlled. Phos down. Continue calcium acetate. Cont rocaltrol.  ? ?Subjective:   ? ?Seen in room.  No complaints.  For fistulogram tomorrow 03/14/22.    ? ?Objective:   ?BP (!) 149/81 (BP Location: Left Arm)   Pulse 73   Temp 98.1 ?F (36.7 ?C) (Axillary)   Resp 19   Ht 5\' 3"  (1.6 m)   Wt 82.9 kg   SpO2 100%   BMI 32.37 kg/m?  ? ?Physical Exam: ?Gen: NAD, sitting in bed ?CVS: RRR ?Resp: clear ?Abd: soft ?Ext: no LE edema ?ACCESS: R AVF + infiltration, good T/B ? ?Labs: ?BMET ?Recent Labs  ?Lab 03/07/22 ?0112 03/08/22 ?0045  03/09/22 ?0106 03/09/22 ?1416 03/10/22 ?0254 03/10/22 ?1521 03/11/22 ?0041 03/13/22 ?0111  ?NA 131* 130* 128* 133*  --  129* 134* 132*  ?K 3.6 4.0 4.5 4.2  --  4.8 3.8 4.0  ?CL 93* 95* 93* 97*  --  93* 96* 95*  ?CO2 23 23 19* 23  --  25 31 28   ?GLUCOSE 103* 189* 136* 211*  --  209* 175* 159*  ?BUN 66* 43* 52* 18  --  29* 12 23  ?CREATININE 10.20* 7.64* 9.70* 5.30*  --  8.08* 4.76* 7.04*  ?CALCIUM 8.9 8.4* 8.6* 8.8*  --  9.1 9.0 9.4  ?PHOS 8.5* 5.7*  --   --  4.6 5.3*  --   --   ? ?CBC ?Recent Labs  ?Lab 03/10/22 ?2706 03/11/22 ?0041 03/12/22 ?2376 03/13/22 ?0111  ?WBC 12.7* 12.7* 11.5* 10.2  ?HGB 9.6* 9.3* 9.1* 9.1*  ?HCT 29.7* 29.2* 28.5* 28.3*  ?MCV 87.1 88.0 89.1 89.6  ?PLT 189 183 180 197  ? ? ?  ?Medications:   ? ? apixaban  5 mg Oral BID  ? atenolol  50 mg Oral Once per day on Mon Wed Fri Sat  ? atenolol  50 mg Oral Once per day on Sun Tue Thu Sat  ?  busPIRone  15 mg Oral BID  ? calcium acetate  1,334 mg Oral TID with meals  ? diltiazem  180 mg Oral QPM  ? famotidine  20 mg Oral QHS  ? gabapentin  400 mg Oral Q T,Th,Sat-1800  ? insulin aspart  0-5 Units Subcutaneous QHS  ? insulin aspart  0-6 Units Subcutaneous TID WC  ? insulin detemir  15 Units Subcutaneous QHS  ? pantoprazole  40 mg Oral Daily  ? polyethylene glycol  17 g Oral Daily  ? QUEtiapine  100 mg Oral QHS  ? rOPINIRole  1 mg Oral QHS  ? rosuvastatin  5 mg Oral Daily  ? senna-docusate  1 tablet Oral BID  ? sodium chloride flush  3 mL Intravenous Q12H  ? sodium chloride flush  3 mL Intravenous Q12H  ? ticagrelor  90 mg Oral BID  ? torsemide  20 mg Oral Daily  ? ? ? ?Madelon Lips, MD ?03/13/2022, 12:43 PM   ? ? ?

## 2022-03-13 NOTE — Progress Notes (Signed)
?PROGRESS NOTE ? ? ? ?[name]  WUJ:[id number] DOB: 1951-08-17 DOA: 03/04/2022 ?PCP: [name], MD  ? ? ?Brief Narrative:  ? ?Patient is a 71 years old female with history of COPD, hypertension, diabetes mellitus, end-stage renal disease on hemodialysis presented to hospital with chest discomfort and new onset atrial fibrillation.  In the ED, patient was noted to have elevated troponin.  Patient underwent cardiac catheterization on 03/06/2022 which showed severe two-vessel disease involving RCA and LAD.  Patient was thought to be high risk for CT surgery so  underwent PCI on 03/08/2022. Nephrology following for dialysis needs. ?  ?Assessment and Plan: ? ?Principal Problem: ?  Atrial fibrillation with RVR ([place]) ?Active Problems: ?  Unstable angina ([place]) ?  End stage renal disease on dialysis due to type 2 diabetes mellitus ([place]) ?  Pulmonary hypertension ([place]) ?  Type II diabetes mellitus with nephropathy ([place]) ?  Hypertension associated with diabetes ([place]) ?  Hyperlipidemia associated with type 2 diabetes mellitus ([place]) ?  Obesity (BMI 30.0-34.9) ?  Leukocytosis ?  GERD (gastroesophageal reflux disease) ?  Hypokalemia ?  Physical deconditioning ?  Hyponatremia ?  ?* Atrial fibrillation with RVR ([place]) ?Rate controlled at this time.  Currently on aspirin, atenolol and Eliquis, Long-acting Cardizem .  2D echocardiogram at this time showed normal EF but severe pulmonary hypertension. TSH 4.7.  Cardiology followed the patient during hospitalization and at this time have signed off. ? ?Unstable angina ?Patient underwent cardiac catheterization on 03/06/2022 with  severe two-vessel disease involving LAD and RCA.  Patient subsequently underwent cardiac catheterization and coronary atherectomy, PCI and stenting of the severe stenosis in the mid LAD, RCA on 03/08/2022.  Continue aspirin, Brilinta, beta-blocker, statins, Eliquis..  Patient was thought to be high risk for surgery as per cardiothoracic surgery.  Plan is to  continue Brilinta for 90 days followed by aspirin and Eliquis alone.  Patient will follow-up with cardiology in 1 to 2 weeks after discharge ? ?End stage renal disease on dialysis due to type 2 diabetes mellitus ([place]) ?Nephrology on board for hemodialysis need.  On Tuesday Thursday Saturday schedule at baseline.     ? ?Right upper extremity fistula site bleeding.  Seen by vascular surgery.  Plan for fistulogram and potential vascular procedure on 03/14/2022.  Will be n.p.o. past midnight. ? ?Pulmonary hypertension ([place]) ?Does have history of COPD likely causing pulmonary hypertension.  RV systolic pressure was 61.  On supplemental oxygen ? ?Type II diabetes mellitus with nephropathy ([place]) ?Patient uses 25 units of basal insulin at home has been reduced to 15 units sliding scale while in the hospital.  Continue gabapentin for neuropathy.  POC glucose of 140. ? ?Hypertension associated with diabetes ([place]) ?Continue atenolol, long-acting Cardizem.   Cardiology suggesting holding atenolol on the morning of hemodialysis and  and using Cardizem in the evening postdialysis for episodes of hypotension during dialysis. ? ?Hypokalemia ?Stable at present.  Latest potassium of 4.0 ? ?Hyponatremia.  On hemodialysis.  Has been started on torsemide as well.  Sodium level 132 today. ? ?GERD (gastroesophageal reflux disease) ?Continue Pepcid and Protonix. ? ?Leukocytosis ?Normalized today.  Afebrile.  No signs of infection at this time.  Chest x-ray without any acute infiltrate. ? ?Obesity (BMI 30.0-34.9) ?BMI 31.05.  Would benefit from weight loss as outpatient. ? ?Hyperlipidemia associated with type 2 diabetes mellitus ([place]) ?Continue statin ? ?Weakness and deconditioning.  Physical therapy has seen the patient and will consider home health on discharge. ? ?  DVT prophylaxis: SCDs Start: 03/05/22 0239 ?apixaban (ELIQUIS) tablet 5 mg  ? ?Code Status:   ?  Code Status: Full Code ? ?Disposition: Home with home health when okay with  vascular surgery. ? ?Status is: Inpatient ? ?Remains inpatient appropriate because: Status post PCI, hemodialysis, need for fistulogram for 03/14/2022 ? ? Family Communication:  ?Spoke with the patient at bedside. ? ?Consultants:  ?Cardiology ?Cardiothoracic surgery ?Nephrology ?Vascular surgery ? ?Procedures:  ?Cardiac catheterization on 03/06/2022  ?Cardiac catheterization on 03/08/2022 with coronary atherectomy, PCI to mid LAD and RCA ? ?Antimicrobials:  ?None ? ?Anti-infectives (From admission, onward)  ? ? None  ? ?  ? ?Subjective: ?Today, patient was seen and examined at bedside.  Patient denies any nausea vomiting chest pain shortness of breath.   ? ?Objective: ?Vitals:  ? 03/12/22 2120 03/13/22 0010 03/13/22 0981 03/13/22 0709  ?BP: 135/65 139/69 135/62 (!) 149/81  ?Pulse: 100 81 79 73  ?Resp:  15 16 19   ?Temp:  98.9 ?F (37.2 ?C) 98.3 ?F (36.8 ?C) 98.1 ?F (36.7 ?C)  ?TempSrc:  Oral Oral Axillary  ?SpO2:  98% 100%   ?Weight:      ?Height:      ? ? ?Intake/Output Summary (Last 24 hours) at 03/13/2022 1024 ?Last data filed at 03/12/2022 2300 ?Gross per 24 hour  ?Intake --  ?Output 0 ml  ?Net 0 ml  ? ? ?Filed Weights  ? 03/10/22 1833 03/11/22 0745 03/11/22 1110  ?Weight: 82 kg 82 kg 82.9 kg  ? ? ?Physical Examination: ?Body mass index is 32.37 kg/m?.  ? ?General: Obese built, not in obvious distress nasal cannula oxygen ?HENT:   No scleral pallor or icterus noted. Oral mucosa is moist.  ?Chest:    Diminished breath sounds bilaterally. No crackles or wheezes.  ?CVS: S1 &S2 heard. No murmur.  Regular rate and rhythm. ?Abdomen: Soft, nontender, nondistended.  Bowel sounds are heard.   ?Extremities: No cyanosis, clubbing or edema.  Peripheral pulses are palpable.  Right upper extremity AV fistula ?Psych: Alert, awake and oriented, normal mood ?CNS:  No cranial nerve deficits.  Power equal in all extremities.   ?Skin: Warm and dry.  No rashes noted. ? ? ?Data Reviewed:  ? ?CBC: ?Recent Labs  ?Lab 03/09/22 ?1416  03/10/22 ?1914 03/11/22 ?0041 03/12/22 ?7829 03/13/22 ?0111  ?WBC 13.6* 12.7* 12.7* 11.5* 10.2  ?HGB 11.3* 9.6* 9.3* 9.1* 9.1*  ?HCT 34.3* 29.7* 29.2* 28.5* 28.3*  ?MCV 86.4 87.1 88.0 89.1 89.6  ?PLT 193 189 183 180 197  ? ? ? ?Basic Metabolic Panel: ?Recent Labs  ?Lab 03/07/22 ?0112 03/08/22 ?0045 03/09/22 ?0106 03/09/22 ?1416 03/10/22 ?5621 03/10/22 ?1521 03/11/22 ?0041 03/13/22 ?0111  ?NA 131* 130* 128* 133*  --  129* 134* 132*  ?K 3.6 4.0 4.5 4.2  --  4.8 3.8 4.0  ?CL 93* 95* 93* 97*  --  93* 96* 95*  ?CO2 23 23 19* 23  --  25 31 28   ?GLUCOSE 103* 189* 136* 211*  --  209* 175* 159*  ?BUN 66* 43* 52* 18  --  29* 12 23  ?CREATININE 10.20* 7.64* 9.70* 5.30*  --  8.08* 4.76* 7.04*  ?CALCIUM 8.9 8.4* 8.6* 8.8*  --  9.1 9.0 9.4  ?MG  --   --  2.1  --  2.1  --  2.0  --   ?PHOS 8.5* 5.7*  --   --  4.6 5.3*  --   --   ? ? ? ?Liver Function  Tests: ?Recent Labs  ?Lab 03/07/22 ?0112 03/08/22 ?0045 03/09/22 ?0106 03/10/22 ?1521  ?AST  --   --  12*  --   ?ALT  --   --  17  --   ?ALKPHOS  --   --  62  --   ?BILITOT  --   --  0.7  --   ?PROT  --   --  5.6*  --   ?ALBUMIN 3.1* 2.9* 3.2* 3.0*  ? ? ? ?Radiology Studies: ?No results found. ? ? ? LOS: 9 days  ? ? ?Flora Lipps, MD ?[facility] ?03/13/2022, 10:24 AM  ? ? ?

## 2022-03-13 NOTE — Plan of Care (Addendum)
?  Problem: Education: ?Goal: Knowledge of disease or condition will improve ?Outcome: Progressing ?Goal: Understanding of medication regimen will improve ?Outcome: Progressing ?  ?Problem: Education: ?Goal: Knowledge of General Education information will improve ?Description: Including pain rating scale, medication(s)/side effects and non-pharmacologic comfort measures ?Outcome: Progressing ?  ?Problem: Clinical Measurements: ?Goal: Will remain free from infection ?Outcome: Progressing ?  ?Problem: Activity: ?Goal: Risk for activity intolerance will decrease ?Outcome: Progressing ?  ?Problem: Skin Integrity: ?Goal: Risk for impaired skin integrity will decrease ?Outcome: Progressing ?  ?

## 2022-03-13 NOTE — Consult Note (Signed)
? ?  [facility] CM Inpatient Consult ? ? ?03/13/2022 ? ?Cooper Render Leber ?06/05/1951 ?[id number] ? ?[facility] [ACO] Patient: Medicare ACO [place] ? ?Reviewed for extreme high risk scores for unplanned readmission ? ?Primary Care Provider:  [name], MD, [place] is an embedded provider with a Chronic Care Management team and program, and is listed for the transition of care follow up and appointments. ? ?Patient was screened for Embedded practice service needs for chronic care management and is active with the Embedded RNCM and Embedded Pharmacist for CCM. ?Met with the patient at the bedside for post hospital needs.  Patient endorses her PCP and her CCM team.  States that her Embedded pharmacist may need to help her with her new medications. Patient is still exhibiting shortness of breath while speaking, RT in for treatment.  Gave the patient a reminder card and 24 hour nurse advise line.  She states she uses [place] for home health, briefly explained the difference with care management and home health. ? ?Plan: Notification sent to the Embedded [place]  and Embedded Pharmacist for ongoing follow up Care Management and made aware of TOC updates for post hospital care. Will continue to follow. ? ?Please contact for further questions, ? ?Natividad Brood, RN BSN CCM ?[facility] Liaison ? [phone] business mobile phone ?Toll free office [phone]  ?Fax number: [phone] ?[email or web address] ? ? ? ?

## 2022-03-13 NOTE — Progress Notes (Signed)
Mobility Specialist Progress Note: ? ? 03/13/22 1640  ?Mobility  ?Activity Ambulated with assistance in hallway  ?Level of Assistance Standby assist, set-up cues, supervision of patient - no hands on  ?Assistive Device Front wheel walker  ?Distance Ambulated (ft) 250 ft  ?Activity Response Tolerated well  ?$Mobility charge 1 Mobility  ? ? ?Pre Mobility: SpO2 98% RA ?During Mobility: SpO2 92% RA ? ?Pt eager for mobility session. Ambulated at supervision level with RW on RA. VSS on RA, pt with minor SOB throughout. Pt left sitting EOB with all needs met, placed on 1LO2 for pt comfort.  ? ?Nelta Numbers ?Acute Rehab ?Phone: [phone] ?Office Phone: [phone] ? ?

## 2022-03-13 NOTE — Progress Notes (Signed)
Physical Therapy Treatment ?Patient Details ?Name: [name] ?MRN: [id number] ?DOB: 15-Aug-1951 ?Today's Date: 03/13/2022 ? ? ?History of Present Illness 71 y/o female adm 4/1 with chest pressure, tightness and SOB with new onset A-fib with RVR. 4/5 heart cath via left fem.PMHx: CKD on HD, CAD, CHF, COPD, HTN, PVD, neuropathy, CVA. ? ?  ?PT Comments  ? ? Patient progressing well towards PT goals. Session focused on progressive ambulation, weaning 02 and transfers. Tolerated gait training with Min guard and use of RW for support. Sp02 remained >93% on RA throughout with HRs in 70-80s bpm. Reports some blood after going to the bathroom, RN made aware. Recommend continue maximizing mobility while in the hospital. Will follow. ?  ?Recommendations for follow up therapy are one component of a multi-disciplinary discharge planning process, led by the attending physician.  Recommendations may be updated based on patient status, additional functional criteria and insurance authorization. ? ?Follow Up Recommendations ? Home health PT ?  ?  ?Assistance Recommended at Discharge Intermittent Supervision/Assistance  ?Patient can return home with the following A little help with walking and/or transfers;A little help with bathing/dressing/bathroom;Assistance with cooking/housework;Assist for transportation;Help with stairs or ramp for entrance ?  ?Equipment Recommendations ? None recommended by PT  ?  ?Recommendations for Other Services   ? ? ?  ?Precautions / Restrictions Precautions ?Precautions: Fall ?Restrictions ?Weight Bearing Restrictions: No  ?  ? ?Mobility ? Bed Mobility ?Overal bed mobility: Modified Independent ?  ?  ?  ?  ?  ?  ?General bed mobility comments: Use of rail and increased time. ?  ? ?Transfers ?Overall transfer level: Needs assistance ?Equipment used: Rolling walker (2 wheels) ?Transfers: Sit to/from Stand ?Sit to Stand: Supervision ?  ?  ?  ?  ?  ?General transfer comment: Supervision for safety. Stood  from [employer], from toilet x1, slow to rise. ?  ? ?Ambulation/Gait ?Ambulation/Gait assistance: Min guard ?Gait Distance (Feet): 150 Feet ?Assistive device: Rolling walker (2 wheels) ?Gait Pattern/deviations: Step-through pattern, Decreased stride length ?Gait velocity: decreased ?Gait velocity interpretation: 1.31 - 2.62 ft/sec, indicative of limited community ambulator ?  ?General Gait Details: Slow, mostly steady gait with cues for RW proximity. HR 70-80s bpm and SP02 >93% on RA. ? ? ?Stairs ?  ?  ?  ?  ?  ? ? ?Wheelchair Mobility ?  ? ?Modified Rankin (Stroke Patients Only) ?  ? ? ?  ?Balance Overall balance assessment: Needs assistance ?Sitting-balance support: Feet supported, No upper extremity supported ?Sitting balance-Leahy Scale: Good ?Sitting balance - Comments: SOme assist needed to donn pampers due to difficulty lifting LEs. ?  ?Standing balance support: During functional activity ?Standing balance-Leahy Scale: Fair ?Standing balance comment: no UE support at sink, RW for gait ?  ?  ?  ?  ?  ?  ?  ?  ?  ?  ?  ?  ? ?  ?Cognition Arousal/Alertness: Awake/alert ?Behavior During Therapy: [facility] for tasks assessed/performed ?Overall Cognitive Status: Within Functional Limits for tasks assessed ?  ?  ?  ?  ?  ?  ?  ?  ?  ?  ?  ?  ?  ?  ?  ?  ?  ?  ?  ? ?  ?Exercises   ? ?  ?General Comments General comments (skin integrity, edema, etc.): SP02 >93% on RA, kept off 02. Pt reports some blood in her pampers as well as in the toilet, reports she does not know where  it is coming from. RN made aware. ?  ?  ? ?Pertinent Vitals/Pain Pain Assessment ?Pain Assessment: Faces ?Faces Pain Scale: Hurts little more ?Pain Location: RUE fistula site ?Pain Descriptors / Indicators: Sore ?Pain Intervention(s): Monitored during session, Repositioned  ? ? ?Home Living   ?  ?  ?  ?  ?  ?  ?  ?  ?  ?   ?  ?Prior Function    ?  ?  ?   ? ?PT Goals (current goals can now be found in the care plan section) Progress towards PT goals:  Progressing toward goals ? ?  ?Frequency ? ? ? Min 3X/week ? ? ? ?  ?PT Plan Current plan remains appropriate  ? ? ?Co-evaluation   ?  ?  ?  ?  ? ?  ?AM-PAC PT "6 Clicks" Mobility   ?Outcome Measure ? Help needed turning from your back to your side while in a flat bed without using bedrails?: None ?Help needed moving from lying on your back to sitting on the side of a flat bed without using bedrails?: None ?Help needed moving to and from a bed to a chair (including a wheelchair)?: None ?Help needed standing up from a chair using your arms (e.g., wheelchair or bedside chair)?: A Little ?Help needed to walk in hospital room?: A Little ?Help needed climbing 3-5 steps with a railing? : A Little ?6 Click Score: 21 ? ?  ?End of Session Equipment Utilized During Treatment: Gait belt ?Activity Tolerance: Patient tolerated treatment well ?Patient left: in bed;with call bell/phone within reach (sitting EOB) ?Nurse Communication: Mobility status ?PT Visit Diagnosis: Muscle weakness (generalized) (M62.81);Difficulty in walking, not elsewhere classified (R26.2);Other abnormalities of gait and mobility (R26.89) ?  ? ? ?Time: 0141-0301 ?PT Time Calculation (min) (ACUTE ONLY): 24 min ? ?Charges:  $Gait Training: 8-22 mins ?$Therapeutic Activity: 8-22 mins          ?          ? ?Marisa Severin, PT, DPT ?Acute Rehabilitation Services ?Secure chat preferred ?Office [phone] ? ? ? ? ? ?[place] ?03/13/2022, 10:59 AM ? ?

## 2022-03-13 NOTE — Progress Notes (Addendum)
Patient in bathroom straining to have BM. She was only able to have a small pebble like stool. There was blood on the toilet paper, small amt with small clots. MD paged to make aware.  ? ?Continue to monitor. ?

## 2022-03-13 NOTE — Progress Notes (Addendum)
?  Progress Note ? ? ? ?03/13/2022 ?8:08 AM ?5 Days Post-Op ? ?Subjective:  no complaints ? ? ?Vitals:  ? 03/13/22 0338 03/13/22 0709  ?BP: 135/62 (!) 149/81  ?Pulse: 79 73  ?Resp: 16 19  ?Temp: 98.3 ?F (36.8 ?C) 98.1 ?F (36.7 ?C)  ?SpO2: 100%   ? ?Physical Exam: ?Lungs:  non labored ?Extremities:  palpable thrill R arm AVF ?Neurologic: A&O ? ?CBC ?   ?Component Value Date/Time  ? WBC 10.2 03/13/2022 0111  ? RBC 3.16 (L) 03/13/2022 0111  ? HGB 9.1 (L) 03/13/2022 0111  ? HGB 10.1 (L) 01/31/2021 1001  ? HCT 28.3 (L) 03/13/2022 0111  ? HCT 31.2 (L) 01/31/2021 1001  ? PLT 197 03/13/2022 0111  ? PLT 263 01/31/2021 1001  ? MCV 89.6 03/13/2022 0111  ? MCV 91 01/31/2021 1001  ? MCH 28.8 03/13/2022 0111  ? MCHC 32.2 03/13/2022 0111  ? RDW 17.2 (H) 03/13/2022 0111  ? RDW 14.4 01/31/2021 1001  ? LYMPHSABS 1.7 03/05/2022 0740  ? LYMPHSABS 2.8 01/31/2021 1001  ? MONOABS 0.6 03/05/2022 0740  ? EOSABS 0.0 03/05/2022 0740  ? EOSABS 1.2 (H) 01/31/2021 1001  ? BASOSABS 0.0 03/05/2022 0740  ? BASOSABS 0.1 01/31/2021 1001  ? ? ?BMET ?   ?Component Value Date/Time  ? NA 132 (L) 03/13/2022 0111  ? NA 138 01/31/2021 1001  ? K 4.0 03/13/2022 0111  ? CL 95 (L) 03/13/2022 0111  ? CO2 28 03/13/2022 0111  ? GLUCOSE 159 (H) 03/13/2022 0111  ? BUN 23 03/13/2022 0111  ? BUN 69 (H) 01/31/2021 1001  ? CREATININE 7.04 (H) 03/13/2022 0111  ? CREATININE 1.66 (H) 03/25/2013 1026  ? CALCIUM 9.4 03/13/2022 0111  ? GFRNONAA 6 (L) 03/13/2022 0111  ? GFRAA 4 (L) 11/01/2020 1022  ? ? ?INR ?   ?Component Value Date/Time  ? INR 1.0 06/17/2008 0625  ? ? ? ?Intake/Output Summary (Last 24 hours) at 03/13/2022 0808 ?Last data filed at 03/12/2022 2300 ?Gross per 24 hour  ?Intake --  ?Output 0 ml  ?Net 0 ml  ? ? ? ?Assessment/Plan:  71 y.o. female with prolonged needle hole bleeding R arm AVF ? ?Plan is for R arm fistulogram with possible intervention with Dr. Virl Cagey tomorrow 4/11 ?Questions answered and patient agreeable to proceed ?NPO past midnight ?Consent  ordered ?PLEASE DO NOT DIALYZE PATIENT UNTIL AFTER HER PROCEDURE ? ? ? ?Dagoberto Ligas, PA-C ?[facility] ?[phone] ?03/13/2022 ?8:08 AM ? ?VASCULAR STAFF ADDENDUM: ?I have independently interviewed and examined the patient. ?I agree with the above.  ? ? ?J. Melene Muller, MD ?[facility] ?Office Phone Number: [phone] ?03/13/2022 6:33 PM ? ? ? ?

## 2022-03-14 ENCOUNTER — Encounter (HOSPITAL_COMMUNITY): Payer: Self-pay | Admitting: Vascular Surgery

## 2022-03-14 ENCOUNTER — Encounter (HOSPITAL_COMMUNITY)
Admission: EM | Disposition: A | Discharge status: Home Health | Payer: Self-pay | Source: Home / Self Care | Attending: Internal Medicine

## 2022-03-14 DIAGNOSIS — I4891 Unspecified atrial fibrillation: Secondary | ICD-10-CM | POA: Diagnosis not present

## 2022-03-14 DIAGNOSIS — E1122 Type 2 diabetes mellitus with diabetic chronic kidney disease: Secondary | ICD-10-CM | POA: Diagnosis not present

## 2022-03-14 DIAGNOSIS — E1169 Type 2 diabetes mellitus with other specified complication: Secondary | ICD-10-CM | POA: Diagnosis not present

## 2022-03-14 DIAGNOSIS — K219 Gastro-esophageal reflux disease without esophagitis: Secondary | ICD-10-CM | POA: Diagnosis not present

## 2022-03-14 HISTORY — PX: PERIPHERAL VASCULAR BALLOON ANGIOPLASTY: CATH118281

## 2022-03-14 LAB — URINALYSIS, ROUTINE W REFLEX MICROSCOPIC
Bilirubin Urine: NEGATIVE
Glucose, UA: 250 mg/dL — AB
Ketones, ur: NEGATIVE mg/dL
Nitrite: NEGATIVE
Protein, ur: >300 mg/dL — AB
Specific Gravity, Urine: 1.01 (ref 1.005–1.030)
pH: 7.5 (ref 5.0–8.0)

## 2022-03-14 LAB — BASIC METABOLIC PANEL
Anion gap: 11 (ref 5–15)
BUN: 36 mg/dL — ABNORMAL HIGH (ref 8–23)
CO2: 25 mmol/L (ref 22–32)
Calcium: 9.2 mg/dL (ref 8.9–10.3)
Chloride: 92 mmol/L — ABNORMAL LOW (ref 98–111)
Creatinine, Ser: 8.85 mg/dL — ABNORMAL HIGH (ref 0.44–1.00)
GFR, Estimated: 4 mL/min — ABNORMAL LOW (ref >60)
Glucose, Bld: 167 mg/dL — ABNORMAL HIGH (ref 70–99)
Potassium: 4.3 mmol/L (ref 3.5–5.1)
Sodium: 128 mmol/L — ABNORMAL LOW (ref 135–145)

## 2022-03-14 LAB — GLUCOSE, CAPILLARY
Glucose-Capillary: 122 mg/dL — ABNORMAL HIGH (ref 70–99)
Glucose-Capillary: 133 mg/dL — ABNORMAL HIGH (ref 70–99)
Glucose-Capillary: 100 mg/dL — ABNORMAL HIGH (ref 70–99)
Glucose-Capillary: 263 mg/dL — ABNORMAL HIGH (ref 70–99)

## 2022-03-14 LAB — CBC
HCT: 27.5 % — ABNORMAL LOW (ref 36.0–46.0)
Hemoglobin: 9 g/dL — ABNORMAL LOW (ref 12.0–15.0)
MCH: 28.5 pg (ref 26.0–34.0)
MCHC: 32.7 g/dL (ref 30.0–36.0)
MCV: 87 fL (ref 80.0–100.0)
Platelets: 189 10*3/uL (ref 150–400)
RBC: 3.16 MIL/uL — ABNORMAL LOW (ref 3.87–5.11)
RDW: 17 % — ABNORMAL HIGH (ref 11.5–15.5)
WBC: 12.4 10*3/uL — ABNORMAL HIGH (ref 4.0–10.5)
nRBC: 0 % (ref 0.0–0.2)

## 2022-03-14 LAB — HEMOGLOBIN AND HEMATOCRIT, BLOOD
HCT: 31 % — ABNORMAL LOW (ref 36.0–46.0)
Hemoglobin: 9.9 g/dL — ABNORMAL LOW (ref 12.0–15.0)

## 2022-03-14 LAB — URINALYSIS, MICROSCOPIC (REFLEX)
Bacteria, UA: NONE SEEN
RBC / HPF: >50 RBC/hpf (ref 0–5)

## 2022-03-14 LAB — MAGNESIUM: Magnesium: 2.4 mg/dL (ref 1.7–2.4)

## 2022-03-14 SURGERY — PERIPHERAL VASCULAR BALLOON ANGIOPLASTY
Anesthesia: LOCAL

## 2022-03-14 MED ORDER — MELATONIN 3 MG PO TABS
3.0000 mg | ORAL_TABLET | Freq: Once | ORAL | Status: AC | PRN
Start: 2022-03-14 — End: 2022-03-15
  Administered 2022-03-15: 3 mg via ORAL
  Filled 2022-03-14: qty 1

## 2022-03-14 MED ORDER — LIDOCAINE HCL (PF) 1 % IJ SOLN
INTRAMUSCULAR | Status: DC | PRN
Start: 2022-03-14 — End: 2022-03-14
  Administered 2022-03-14: 2 mL

## 2022-03-14 MED ORDER — IODIXANOL 320 MG/ML IV SOLN
INTRAVENOUS | Status: DC | PRN
  Administered 2022-03-14: 40 mL

## 2022-03-14 MED ORDER — HEPARIN (PORCINE) IN NACL 1000-0.9 UT/500ML-% IV SOLN
INTRAVENOUS | Status: DC | PRN
  Administered 2022-03-14: 500 mL

## 2022-03-14 MED ORDER — LIDOCAINE HCL (PF) 1 % IJ SOLN
INTRAMUSCULAR | Status: AC
Start: 2022-03-14 — End: ?
  Filled 2022-03-14: qty 30

## 2022-03-14 SURGICAL SUPPLY — 14 items
BAG SNAP BAND KOVER 36X36 (MISCELLANEOUS) ×3 IMPLANT
BALLN LUTONIX AV 8X60X75 (BALLOONS) ×3
BALLOON LUTONIX AV 8X60X75 (BALLOONS) ×1 IMPLANT
COVER DOME SNAP 22 D (MISCELLANEOUS) ×3 IMPLANT
KIT ENCORE 26 ADVANTAGE (KITS) ×2 IMPLANT
KIT MICROPUNCTURE NIT STIFF (SHEATH) ×2 IMPLANT
PROTECTION STATION PRESSURIZED (MISCELLANEOUS) ×3
SHEATH PINNACLE R/O II 6F 4CM (SHEATH) ×2 IMPLANT
SHEATH PROBE COVER 6X72 (BAG) ×3 IMPLANT
STATION PROTECTION PRESSURIZED (MISCELLANEOUS) ×2 IMPLANT
STOPCOCK MORSE 400PSI 3WAY (MISCELLANEOUS) ×3 IMPLANT
TRAY PV CATH (CUSTOM PROCEDURE TRAY) ×3 IMPLANT
TUBING CIL FLEX 10 FLL-RA (TUBING) ×3 IMPLANT
WIRE BENTSON .035X145CM (WIRE) ×2 IMPLANT

## 2022-03-14 NOTE — Progress Notes (Signed)
?   03/14/22 2215  ?Vitals  ?ECG Heart Rate 81  ?Resp (!) 25  ?MEWS COLOR  ?MEWS Score Color Green  ?Oxygen Therapy  ?SpO2 90 %  ?O2 Device Room Air  ?Patient Activity (if Appropriate) Ambulating  ?Pulse Oximetry Type Continuous  ?MEWS Score  ?MEWS Temp 0  ?MEWS Systolic 0  ?MEWS Pulse 0  ?MEWS RR 1  ?MEWS LOC 0  ?MEWS Score 1  ? ?Pt O2 sat was 97% on RA prior to ambulating. During ambulation, sat dropped to 90% on RA. Pt complained of SOB after ambulation with sat of 93% on RA, 2L per nasal cannula given for comfort. ?

## 2022-03-14 NOTE — Progress Notes (Signed)
Transported to  hemodialysis by bed awake and alert. 

## 2022-03-14 NOTE — Progress Notes (Signed)
Back from hemodialysis by bed awake and alert. 

## 2022-03-14 NOTE — Progress Notes (Signed)
?[facility] ?Progress Note  ? ?Assessment/ Plan:   ? OP HD: [facility] ?  4h   84kg   350/500  RUE AVF  Heparin 1000u/hr ? - 4/04 > hep B Ag neg, and hep B Abs were low/ not protective ? - rocaltrol 1.75 ug tiw ? - mircera 50 ug q 4 wks ? - venofer 50mg  weekly ?  ?  ?Assessment/ Plan: ?Chest pain: presented in a.fib with RVR. On diltiazem. Rate controlled at present. Management per primary/cardiology. SP LHC which showed severe 2V CAD. Seen by TCTS, too high risk for surgery. Had atherectomy/ PCI to tight LAD and RCA lesions on 4/05. Added brilinta post cath.  ? ESRD:  on HD TTS. HD today 03/14/22, post fgram  ? Bleeding - prolonged access bleeding after HD Sat.  VVS consulting, fgram 4/11 ? Hypertension/volume: BPs okay. Diltiazem added for afib. Given BP drops on dialysis, cardiology recommended special dosing > holding atenolol the morning of dialysis and the prior evening, and give the long-acting po diltiazem every evening. Seems to have helped w/ BP's in HD here. LVEDP from cath 4/03 was up at 30 mmHg. Did extra HD this past week and new edw should be around 82kg or possibly lower.  ?Afib w/ RVR - new onset here, now on eliquis, and diltiazem added to atenolol. Per cards.  ? Anemia: Hgb 9-12 here. No esa needs.  ? Metabolic bone disease: Calcium controlled. Phos down. Continue calcium acetate. Cont rocaltrol.  ?8.  Dispo: ok to d/c from renal perspective after HD today ? ?Subjective:   ? ?Seen and examined on HD.  BFR 400 mL/ min via R AVF, UF goal 3L.  Had fgram today with 14% cephalic vein stenosis- angioplastied.   ? ?Objective:   ?BP 138/68   Pulse 61   Temp 97.8 ?F (36.6 ?C) (Oral)   Resp 12   Ht 5\' 3"  (1.6 m)   Wt 86 kg   SpO2 100%   BMI 33.59 kg/m?  ? ?Physical Exam: ?Gen: NAD, sitting in bed, on HD ?CVS: RRR ?Resp: clear ?Abd: soft ?Ext: no LE edema ?ACCESS: R AVF  accessed ? ?Labs: ?BMET ?Recent Labs  ?Lab 03/08/22 ?0045 03/09/22 ?0106 03/09/22 ?1416 03/10/22 ?9702  03/10/22 ?1521 03/11/22 ?0041 03/13/22 ?0111 03/14/22 ?6378  ?NA 130* 128* 133*  --  129* 134* 132* 128*  ?K 4.0 4.5 4.2  --  4.8 3.8 4.0 4.3  ?CL 95* 93* 97*  --  93* 96* 95* 92*  ?CO2 23 19* 23  --  25 31 28 25   ?GLUCOSE 189* 136* 211*  --  209* 175* 159* 167*  ?BUN 43* 52* 18  --  29* 12 23 36*  ?CREATININE 7.64* 9.70* 5.30*  --  8.08* 4.76* 7.04* 8.85*  ?CALCIUM 8.4* 8.6* 8.8*  --  9.1 9.0 9.4 9.2  ?PHOS 5.7*  --   --  4.6 5.3*  --   --   --   ? ?CBC ?Recent Labs  ?Lab 03/11/22 ?0041 03/12/22 ?5885 03/13/22 ?0111 03/14/22 ?0277  ?WBC 12.7* 11.5* 10.2 12.4*  ?HGB 9.3* 9.1* 9.1* 9.0*  ?HCT 29.2* 28.5* 28.3* 27.5*  ?MCV 88.0 89.1 89.6 87.0  ?PLT 183 180 197 189  ? ? ?  ?Medications:   ? ? apixaban  5 mg Oral BID  ? atenolol  50 mg Oral Once per day on Mon Wed Fri Sat  ? atenolol  50 mg Oral Once per day on Sun Tue  Thu Sat  ? calcium acetate  1,334 mg Oral TID with meals  ? diltiazem  180 mg Oral QPM  ? famotidine  20 mg Oral QHS  ? gabapentin  400 mg Oral Q T,Th,Sat-1800  ? insulin aspart  0-5 Units Subcutaneous QHS  ? insulin aspart  0-6 Units Subcutaneous TID WC  ? insulin detemir  15 Units Subcutaneous QHS  ? pantoprazole  40 mg Oral Daily  ? polyethylene glycol  17 g Oral Daily  ? QUEtiapine  100 mg Oral QHS  ? rOPINIRole  1 mg Oral QHS  ? rosuvastatin  5 mg Oral Daily  ? senna-docusate  1 tablet Oral BID  ? sodium chloride flush  3 mL Intravenous Q12H  ? sodium chloride flush  3 mL Intravenous Q12H  ? ticagrelor  90 mg Oral BID  ? torsemide  20 mg Oral Daily  ? ? ? ?Madelon Lips, MD ?03/14/2022, 1:53 PM   ? ? ?

## 2022-03-14 NOTE — Progress Notes (Signed)
Attempted to check oxygen level on ambulation. Not able to get a good wave form. Fingers are cold , tried the ear lobe still not picking up. Changed the probe and cable and used 2 portable pulse oximeter not picking it up. Md made aware. To check later again. ?

## 2022-03-14 NOTE — Op Note (Signed)
? ? ?  Patient name: [name] MRN: [id number] DOB: 1951/03/17 Sex: female ? ?03/14/2022 ?Pre-operative Diagnosis: Fistula malfunction-prolonged bleeding status post dialysis ?Post-operative diagnosis:  Same ?Surgeon:  Broadus John, MD ?Procedure Performed: ?1.  Ultrasound-guided micropuncture access of the right arm brachiocephalic fistula ?2.  Fistulogram ?3.  8 x 60 mm drug-coated balloon angioplasty of the central portion of the cephalic vein at its confluence with the axillary vein ?4.  Access managed with Monocryl suture ? ? ?Indications: Patient is a 71 year old female with brachiocephalic fistula.  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  Last invention took place in May of 2022.  At her most recent dialysis visit, [name] had significant bleeding after decannulation.  The fistula was pulsatile on exam, with concern for recurrent central stenosis.  After discussing the risk and benefits of fistulogram, she elected to proceed. ? ? ?Findings:  ?3 cm segment of cephalic vein with 10% stenosis located at its confluence with the axillary vein.   ?Widely patent arterial anastomosis, widely patent fistula otherwise. ?  ?Procedure: Patient was brought to the Cath Lab and laid in supine position.  She was prepped and draped in standard fashion and a timeout was performed.  Ultrasound was used to insonate the fistula and marked the anastomosis.  Lidocaine was used to anesthetize the dermis, and the fistula was accessed using a an ultrasound-guided micropuncture needle.  Fistulogram followed, see results above. ?I elected to intervene on the area of stenosis within the cephalic vein.  A short 6 sheath was placed, and an 8 x 60 mm drug-coated balloon was brought onto the field.  This was inflated across the lesion to profile for 3 minutes.  Follow-up angiography demonstrated excellent result with  resolution of the flow-limiting stenosis.  Residual stenosis was less than 30%. ? ?Access was managed with Monocryl suture. ? ? ?J. Melene Muller, MD ?[facility] ?Office: [phone] ? ? ? ?

## 2022-03-14 NOTE — Progress Notes (Signed)
Back from the cath lab awake and alert. Small dressing to right fistula dry and intact. Site + bruit and thrill. Instructed to minimize movement of the affected arm,no bleeding noted, continue to monitor. ?

## 2022-03-14 NOTE — Progress Notes (Signed)
Pt had procedure earlier today. D/C order noted. Contacted [employer] and to spoke to [place]. Advised clinic that pt will d/c today and should resume care on Thursday. Will fax d/c summary to clinic once available for continuation of care.  ? ?Melven Sartorius ?Renal Navigator ?[phone] ?

## 2022-03-14 NOTE — Progress Notes (Signed)
?PROGRESS NOTE ? ? ? ?[name]  ENI:[id number] DOB: October 15, 1951 DOA: 03/04/2022 ?PCP: [name], MD  ? ? ?Brief Narrative:  ? ?Patient is a 71 years old female with history of COPD, hypertension, diabetes mellitus, end-stage renal disease on hemodialysis presented to hospital with chest discomfort and new onset atrial fibrillation.  In the ED, patient was noted to have elevated troponin.  Patient underwent cardiac catheterization on 03/06/2022 which showed severe two-vessel disease involving RCA and LAD.  Patient was thought to be high risk for CT surgery so  underwent PCI on 03/08/2022. Nephrology followed the patient for dialysis needs. ?  ?Assessment and Plan: ? ?Principal Problem: ?  Atrial fibrillation with RVR ([place]) ?Active Problems: ?  Unstable angina ([place]) ?  End stage renal disease on dialysis due to type 2 diabetes mellitus ([place]) ?  Pulmonary hypertension ([place]) ?  Type II diabetes mellitus with nephropathy ([place]) ?  Hypertension associated with diabetes ([place]) ?  Hyperlipidemia associated with type 2 diabetes mellitus ([place]) ?  Obesity (BMI 30.0-34.9) ?  Leukocytosis ?  GERD (gastroesophageal reflux disease) ?  Hypokalemia ?  Physical deconditioning ?  Hyponatremia ?  ?* Atrial fibrillation with RVR ([place]) ?Rate controlled at this time.  Currently on atenolol and Eliquis, Long-acting Cardizem .  2D echocardiogram at this time showed normal EF but severe pulmonary hypertension. TSH 4.7.  Cardiology followed the patient during hospitalization and at this time have signed off. ? ?Unstable angina ?Patient underwent cardiac catheterization on 03/06/2022 with  severe two-vessel disease involving LAD and RCA.  Patient subsequently underwent cardiac catheterization and coronary atherectomy, PCI and stenting of the severe stenosis in the mid LAD, RCA on 03/08/2022.  Continue  Brilinta, beta-blocker, statins, Eliquis..  Patient was thought to be high risk for surgery as per cardiothoracic surgery.  Plan is to continue  Brilinta for 90 days followed by aspirin and Eliquis alone.  Patient will follow-up with cardiology in 1 to 2 weeks after discharge ? ?Bright red blood per rectum overnight now complaining of hematuria.  We will check urinalysis for hematuria, stool occult blood.  Check hemoglobin and hematocrit 5 AM 5 PM.  If downgoing trend and continues bleeding, patient might need to be reassessed for combination of Brilinta and Eliquis.  Patient just had percutaneous intervention and would benefit from coagulation and antithrombotics on discharge ? ?End stage renal disease on dialysis due to type 2 diabetes mellitus ([place]) ?Nephrology  on board for hemodialysis need.  On Tuesday Thursday Saturday schedule at baseline.     ?  ?Right upper extremity fistula site bleeding.  Seen by vascular surgery.  Underwent fistulogram and vascular procedure on 03/14/2022.   ?  ?Pulmonary hypertension ([place]) ?Does have history of COPD likely causing pulmonary hypertension.  RV systolic pressure was 61.  On supplemental oxygen, will likely need on discharge.  Will do oxygen desat study. ?  ?Type II diabetes mellitus with nephropathy ([place]) ?Patient uses 25 units of basal insulin at home and was reduced to 15 units with sliding scale while in the hospital.  Continue gabapentin for neuropathy.  POC glucose of 140. ?  ?Hypertension associated with diabetes ([place]) ?Continue atenolol, long-acting Cardizem.   Cardiology suggesting holding atenolol on the morning of hemodialysis and  and using Cardizem in the evening postdialysis for episodes of hypotension during dialysis. ?  ?Hypokalemia ?Improved after replacement.  Latest potassium 4.3 ?  ?Hyponatremia.  On hemodialysis.  Has been started on torsemide as well.  Sodium level  128. ?  ?GERD (gastroesophageal reflux disease) ?Continue Pepcid and Protonix. ?  ?Leukocytosis ?  Afebrile.  No signs of infection at this time.  Chest x-ray without any acute infiltrate.  Will need outpatient monitoring. ?   ?Obesity (BMI 30.0-34.9) ?BMI 31.05.  Would benefit from weight loss as outpatient. ?  ?Hyperlipidemia associated with type 2 diabetes mellitus ([place]) ?Continue statin ?  ?Weakness and deconditioning.  Physical therapy has seen the patient and the plan is home health on discharge. ? ? DVT prophylaxis: SCDs Start: 03/05/22 0239 ?apixaban (ELIQUIS) tablet 5 mg  ? ?Code Status:   ?  Code Status: Full Code ? ?Disposition: Home with home health in 1 to 2 days.  Watch closely for bleeding/hemoglobin hematocrit.  ? ?Status is: Inpatient ? ?Remains inpatient appropriate because: Status post PCI, hemodialysis, status post fistulogram  03/14/2022, new rectal urinary bleed. ? ? Family Communication:  ?Spoke with the patient at bedside. ? ?Consultants:  ?Cardiology ?Cardiothoracic surgery ?Nephrology ?Vascular surgery ? ?Procedures:  ?Cardiac catheterization on 03/06/2022  ?Cardiac catheterization on 03/08/2022 with coronary atherectomy, PCI to mid LAD and RCA ? ?Antimicrobials:  ?None ? ?Anti-infectives (From admission, onward)  ? ? None  ? ?  ? ?Subjective: ?Today, seen and examined at bedside.  Underwent hemodialysis.  Patient was reported to have bloody bowel movement in the morning.  In the afternoon patient reported having blood in urine.  Patient still complains of shortness of breath but is not new  ?Objective: ?Vitals:  ? 03/14/22 1430 03/14/22 1500 03/14/22 1530 03/14/22 1605  ?BP: 116/63 (!) 157/69 (!) 141/51 (!) 153/60  ?Pulse:   83 79  ?Resp: 13 16 17 20   ?Temp:    97.9 ?F (36.6 ?C)  ?TempSrc:    Oral  ?SpO2:   100% 100%  ?Weight:    80.9 kg  ?Height:      ? ? ?Intake/Output Summary (Last 24 hours) at 03/14/2022 1709 ?Last data filed at 03/14/2022 1605 ?Gross per 24 hour  ?Intake --  ?Output 3000 ml  ?Net -3000 ml  ? ? ?Filed Weights  ? 03/11/22 1110 03/14/22 1211 03/14/22 1605  ?Weight: 82.9 kg 86 kg 80.9 kg  ? ? ?Physical Examination: ?Body mass index is 31.59 kg/m?.  ? ?General: Obese built, not in obvious distress, on  nasal cannula ?HENT:   Mild pallor noted.  Oral mucosa is moist.  ?Chest:   Diminished breath sounds bilaterally. No crackles or wheezes.  ?CVS: S1 &S2 heard. No murmur.  Regular rate and rhythm. ?Abdomen: Soft, nontender, nondistended.  Bowel sounds are heard.   ?Extremities: Right upper extremity AV fistula.   ?Psych: Alert, awake and oriented, normal mood ?CNS:  No cranial nerve deficits.  Power equal in all extremities.   ?Skin: Warm and dry.  No rashes noted. ?  ? ?Data Reviewed:  ? ?CBC: ?Recent Labs  ?Lab 03/10/22 ?6333 03/11/22 ?0041 03/12/22 ?5456 03/13/22 ?0111 03/14/22 ?2563  ?WBC 12.7* 12.7* 11.5* 10.2 12.4*  ?HGB 9.6* 9.3* 9.1* 9.1* 9.0*  ?HCT 29.7* 29.2* 28.5* 28.3* 27.5*  ?MCV 87.1 88.0 89.1 89.6 87.0  ?PLT 189 183 180 197 189  ? ? ? ?Basic Metabolic Panel: ?Recent Labs  ?Lab 03/08/22 ?0045 03/09/22 ?0106 03/09/22 ?1416 03/10/22 ?8937 03/10/22 ?1521 03/11/22 ?0041 03/13/22 ?0111 03/14/22 ?3428  ?NA 130* 128* 133*  --  129* 134* 132* 128*  ?K 4.0 4.5 4.2  --  4.8 3.8 4.0 4.3  ?CL 95* 93* 97*  --  93* 96* 95*  92*  ?CO2 23 19* 23  --  25 31 28 25   ?GLUCOSE 189* 136* 211*  --  209* 175* 159* 167*  ?BUN 43* 52* 18  --  29* 12 23 36*  ?CREATININE 7.64* 9.70* 5.30*  --  8.08* 4.76* 7.04* 8.85*  ?CALCIUM 8.4* 8.6* 8.8*  --  9.1 9.0 9.4 9.2  ?MG  --  2.1  --  2.1  --  2.0  --  2.4  ?PHOS 5.7*  --   --  4.6 5.3*  --   --   --   ? ? ? ?Liver Function Tests: ?Recent Labs  ?Lab 03/08/22 ?0045 03/09/22 ?0106 03/10/22 ?1521  ?AST  --  12*  --   ?ALT  --  17  --   ?ALKPHOS  --  47  --   ?BILITOT  --  0.7  --   ?PROT  --  5.6*  --   ?ALBUMIN 2.9* 3.2* 3.0*  ? ? ? ?Radiology Studies: ?PERIPHERAL VASCULAR CATHETERIZATION ? ?Result Date: 03/14/2022 ?Images from the original result were not included.   Patient name: [name]          MRN: [id number]        DOB: 12-04-1951          Sex: female  03/14/2022 Pre-operative Diagnosis: Fistula malfunction-prolonged bleeding status post dialysis Post-operative diagnosis:  Same  Surgeon:  Broadus John, MD Procedure Performed: 1.  Ultrasound-guided micropuncture access of the right arm brachiocephalic fistula 2.  Fistulogram 3.  8 x 60 mm drug-coated balloon angioplasty of the cen

## 2022-03-14 NOTE — Progress Notes (Signed)
Went to the bathroom to void. Found urine  with blood tinged  bright red in color.  Patient claimed that she 'd been doing this and told the nurse.Pt on eliquis and brilinta. MD made aware, discharged home order cancelled. Explained to the patient. To collect urine and stool for occult blood. Continue to monitor. ?

## 2022-03-14 NOTE — Discharge Summary (Deleted)
?Physician Discharge Summary ?  ?Patient: [name] MRN: [id number] DOB: 1951-08-27  ?Admit date:     03/04/2022  ?Discharge date: 03/15/22  ?Discharge Physician: Antonieta Pert  ? ?PCP: [name], MD  ? ?Recommendations at discharge:  ? ?Follow-up with your primary care provider in 1 week.   ?Follow-up with cardiology as scheduled by the clinic  ? continue hemo-dialysis as outpatient. ?Check CBC in 1 week ? ?Discharge Diagnoses: ?Principal Problem: ?  Atrial fibrillation with RVR ([place]) ?Active Problems: ?  Unstable angina ([place]) ?  End stage renal disease on dialysis due to type 2 diabetes mellitus ([place]) ?  Pulmonary hypertension ([place]) ?  Type II diabetes mellitus with nephropathy ([place]) ?  Hypertension associated with diabetes ([place]) ?  Hyperlipidemia associated with type 2 diabetes mellitus ([place]) ?  Obesity (BMI 30.0-34.9) ?  Leukocytosis ?  GERD (gastroesophageal reflux disease) ?  Hypokalemia ?  Physical deconditioning ?  Hyponatremia ? ?Resolved Problems: ?  * No resolved hospital problems. * ? ?Hospital Course: ?Patient is a 71 years old female with history of COPD, hypertension, diabetes mellitus, end-stage renal disease on hemodialysis presented to hospital with chest discomfort and new onset atrial fibrillation.  In the ED, patient was noted to have elevated troponin.  Patient underwent cardiac catheterization on 03/06/2022 which showed severe two-vessel disease involving RCA and LAD.  Patient was thought to be high risk for CT surgery so  underwent PCI on 03/08/2022. Nephrology followed the patient for dialysis needs. ?Patient had some blood in the toilet so discharge was held on 03/14/22 pm, monitored overnight hemoglobin up trended up, complaints of pinkish hematuria stool is brown, I discussed in detail with Dr. Moshe Cipro with nephrology and Dr. Eleonore Chiquito with cardiology at this time we cannot stop patient's anticoagulation.  Patient is recommended and advised to monitor the bleeding if it is worse  or any pain advised to follow-up with PCP cardiology or return to the ED.  She is doing well on room air.  She is being discharged home in medically stable condition. ? ?Assessment and Plan: ?Atrial fibrillation with RVR  ?Rate controlled at this time.  Patient was seen by cardiology during hospitalization.  Patient has been adjusted on atenolol Eliquis and long-acting Cardizem..  2D echocardiogram at this time showed normal EF but severe pulmonary hypertension. TSH 4.7.   ?  ?Unstable angina ?Patient underwent cardiac catheterization on 03/06/2022 with  severe two-vessel disease involving LAD and RCA.  Patient subsequently underwent cardiac catheterization and coronary atherectomy, PCI and stenting of the severe stenosis in the mid LAD, RCA on 03/08/2022.  Continue  Brilinta, beta-blocker, statins, Eliquis..  Patient was thought to be high risk for surgery as per cardiothoracic surgery. Due to A-fib plan is for Eliquis and Brilinta for 3 months and then transition to aspirin.Patient will follow-up with cardiology in 1 to 2 weeks after discharge ? ?Hematuria without drop in hemoglobin- urine pinkish.  Advised to monitor and follow-up.  At this time unfortunately cannot  discontinue patient anticoagulation given cardiac procedure.   ? ?End stage renal disease on dialysis due to type 2 diabetes mellitus ([place]) ?Nephrology was on board for hemodialysis need.  On Tuesday Thursday Saturday schedule at baseline.     ?  ?Right upper extremity fistula site bleeding.  Seen by vascular surgery.  Underwent fistulogram and vascular procedure on 03/14/2022.   ?  ?Pulmonary hypertension ([place]) ?Does have history of COPD likely causing pulmonary hypertension.  RV systolic pressure was 61.  On supplemental oxygen, will likely need on discharge. ?  ?Type II diabetes mellitus with nephropathy ([place]) ?Patient uses 25 units of basal insulin at home and was reduced to 15 units with sliding scale while in the hospital.  Continue gabapentin for  neuropathy.  POC glucose of 140. ?  ?Hypertension associated with diabetes ([place]) ?Continue atenolol, long-acting Cardizem.   Cardiology suggesting holding atenolol on the morning of hemodialysis and  and using Cardizem in the evening postdialysis for episodes of hypotension during dialysis. ?  ?Hypokalemia ?Improved after replacement.  Latest potassium 4.3 ?  ?Hyponatremia.  On hemodialysis.  Has been started on torsemide as well.  Sodium level 128. ?  ?GERD (gastroesophageal reflux disease) ?Continue Pepcid and Protonix. ?  ?Leukocytosis ?  Afebrile.  No signs of infection at this time.  Chest x-ray without any acute infiltrate.  Will need outpatient monitoring. ?  ?Obesity (BMI 30.0-34.9) ?BMI 31.05.  Would benefit from weight loss as outpatient. ?  ?Hyperlipidemia associated with type 2 diabetes mellitus ([place]) ?Continue statin ?  ?Weakness and deconditioning.  Physical therapy has seen the patient and the plan is home health on discharge. ? ?Consultants:  ?Nephrology ?Vascular surgery ?Cardiology ?Cardiothoracic surgery ? ?Procedures performed:  ?Cardiac catheterization on 03/06/2022  ?Cardiac catheterization on 03/08/2022 with coronary atherectomy, PCI to mid LAD and RCA  ? ?Disposition: Home health ? ?Diet recommendation:  ?Discharge Diet Orders (From admission, onward)  ? ?  Start     Ordered  ? 03/10/22 0000  Diet - low sodium heart healthy       ? 03/10/22 1010  ? 03/10/22 0000  Diet Carb Modified       ? 03/10/22 1010  ? ?  ?  ? ?  ? ?Cardiac and Carb modified diet ?DISCHARGE MEDICATION: ?Allergies as of 03/15/2022   ? ?   Reactions  ? Azithromycin   ? Prolonged QT  ? Ace Inhibitors Cough  ? Clopidogrel Bisulfate Other (See Comments)  ? Sick, Headache, "Felt terrible"  ? Codeine Other (See Comments)  ? hallucinations  ? Lisinopril Cough  ? Penicillins Other (See Comments)  ? Unknown reaction ?Did it involve swelling of the face/tongue/throat, SOB, or low BP? Unknown ?Did it involve sudden or severe rash/hives,  skin peeling, or any reaction on the inside of your mouth or nose? Unknown ?Did you need to seek medical attention at a hospital or doctor's office? Unknown ?When did it last happen?      childhood allergy ?If all above answers are "NO", may proceed with cephalosporin use.  ? ?  ? ?  ?Medication List  ?  ? ?STOP taking these medications   ? ?aspirin 81 MG EC tablet ?  ?busPIRone 15 MG tablet ?Commonly known as: BUSPAR ?  ?NIFEdipine 60 MG 24 hr tablet ?Commonly known as: PROCARDIA XL/NIFEDICAL XL ?  ?predniSONE 50 MG tablet ?Commonly known as: DELTASONE ?  ? ?  ? ?TAKE these medications   ? ?acetaminophen 500 MG tablet ?Commonly known as: TYLENOL ?Take 1,000 mg by mouth every 6 (six) hours as needed for moderate pain or headache. ?  ?albuterol 108 (90 Base) MCG/ACT inhaler ?Commonly known as: VENTOLIN HFA ?Inhale 2 puffs into the lungs every 6 (six) hours as needed for wheezing or shortness of breath. ?  ?albuterol (2.5 MG/3ML) 0.083% nebulizer solution ?Commonly known as: PROVENTIL ?Take 3 mLs (2.5 mg total) by nebulization every 6 (six) hours as needed for wheezing or shortness of breath. ?  ?apixaban  5 MG Tabs tablet ?Commonly known as: ELIQUIS ?Take 1 tablet (5 mg total) by mouth 2 (two) times daily. ?  ?atenolol 50 MG tablet ?Commonly known as: TENORMIN ?Take 1 tablet (50 mg total) by mouth 2 (two) times daily. Skip on the morning of hemodialysis ?What changed: additional instructions ?  ?Basaglar KwikPen 100 UNIT/ML ?Inject 25 Units into the skin at bedtime. Add five units each time the fasting is over 125 three days in a row. ?  ?budesonide-formoterol 160-4.5 MCG/ACT inhaler ?Commonly known as: SYMBICORT ?Inhale 2 puffs into the lungs 2 (two) times daily. ?  ?calcium acetate 667 MG capsule ?Commonly known as: PHOSLO ?Take 667-1,334 mg by mouth See admin instructions. Take 1334 mg with meals 3 times daily and 667 mg with snacks ?  ?cetirizine 10 MG tablet ?Commonly known as: ZYRTEC ?Take 1 tablet (10 mg total)  by mouth daily as needed for allergies. ?  ?diltiazem 180 MG 24 hr capsule ?Commonly known as: CARDIZEM CD ?Take 1 capsule (180 mg total) by mouth every evening. ?  ?docusate sodium 100 MG capsule ?Comm

## 2022-03-14 NOTE — Progress Notes (Signed)
?  Progress Note ? ? ? ?03/14/2022 ?9:41 AM ?Day of Surgery ? ?Subjective:  no complaints ? ? ?Vitals:  ? 03/14/22 0820 03/14/22 0900  ?BP:    ?Pulse:    ?Resp:    ?Temp:  98.3 ?F (36.8 ?C)  ?SpO2: 99% 98%  ? ?Physical Exam: ?Lungs:  non labored ?Extremities:  palpable thrill R arm AVF ?Neurologic: A&O ? ?CBC ?   ?Component Value Date/Time  ? WBC 12.4 (H) 03/14/2022 0053  ? RBC 3.16 (L) 03/14/2022 0053  ? HGB 9.0 (L) 03/14/2022 0053  ? HGB 10.1 (L) 01/31/2021 1001  ? HCT 27.5 (L) 03/14/2022 0053  ? HCT 31.2 (L) 01/31/2021 1001  ? PLT 189 03/14/2022 0053  ? PLT 263 01/31/2021 1001  ? MCV 87.0 03/14/2022 0053  ? MCV 91 01/31/2021 1001  ? MCH 28.5 03/14/2022 0053  ? MCHC 32.7 03/14/2022 0053  ? RDW 17.0 (H) 03/14/2022 0053  ? RDW 14.4 01/31/2021 1001  ? LYMPHSABS 1.7 03/05/2022 0740  ? LYMPHSABS 2.8 01/31/2021 1001  ? MONOABS 0.6 03/05/2022 0740  ? EOSABS 0.0 03/05/2022 0740  ? EOSABS 1.2 (H) 01/31/2021 1001  ? BASOSABS 0.0 03/05/2022 0740  ? BASOSABS 0.1 01/31/2021 1001  ? ? ?BMET ?   ?Component Value Date/Time  ? NA 128 (L) 03/14/2022 0053  ? NA 138 01/31/2021 1001  ? K 4.3 03/14/2022 0053  ? CL 92 (L) 03/14/2022 0053  ? CO2 25 03/14/2022 0053  ? GLUCOSE 167 (H) 03/14/2022 0053  ? BUN 36 (H) 03/14/2022 0053  ? BUN 69 (H) 01/31/2021 1001  ? CREATININE 8.85 (H) 03/14/2022 0053  ? CREATININE 1.66 (H) 03/25/2013 1026  ? CALCIUM 9.2 03/14/2022 0053  ? GFRNONAA 4 (L) 03/14/2022 0053  ? GFRAA 4 (L) 11/01/2020 1022  ? ? ?INR ?   ?Component Value Date/Time  ? INR 1.0 06/17/2008 0625  ? ? ? ?Intake/Output Summary (Last 24 hours) at 03/14/2022 0941 ?Last data filed at 03/13/2022 2306 ?Gross per 24 hour  ?Intake --  ?Output 0 ml  ?Net 0 ml  ? ? ? ? ?Assessment/Plan:  71 y.o. female with prolonged needle hole bleeding R arm AVF ? ?Plan is for R arm fistulogram with possible intervention as patient has previous history of central stenosis. ?After discussing the risk and benefits of fistulogram, Deriana elected to  proceed ? ? ? ? ?Broadus John MD ?[facility] ?[phone] ?03/14/2022 ?9:41 AM ? ? ? ?

## 2022-03-14 NOTE — Progress Notes (Signed)
Weaned her  off oxygen  for at least an hour tolerated well. But suddenly complained of shortness of breath asked for oxygen, placed n 2L [place]. And claimed to feel better. Continue to monitor. ?

## 2022-03-14 NOTE — Progress Notes (Signed)
Occupational Therapy Treatment ?Patient Details ?Name: [name] ?MRN: [id number] ?DOB: 07-28-1951 ?Today's Date: 03/14/2022 ? ? ?History of present illness 71 y/o female adm 4/1 with chest pressure, tightness and SOB with new onset A-fib with RVR. 4/5 heart cath via left fem.PMHx: CKD on HD, CAD, CHF, COPD, HTN, PVD, neuropathy, CVA. ?  ?OT comments ? Pt 2L [place] 96% with L hand reading and reports still feeling SOB. Pt noted to pause between words showing some dyspnea during communication. Pt anxious due to SOB feeling. OT to continue to follow acutely and progress OOB next session as this session was ended due to pending procedure.   ? ?Recommendations for follow up therapy are one component of a multi-disciplinary discharge planning process, led by the attending physician.  Recommendations may be updated based on patient status, additional functional criteria and insurance authorization. ?   ?Follow Up Recommendations ? Home health OT  ?  ?Assistance Recommended at Discharge Set up Supervision/Assistance  ?Patient can return home with the following ? A little help with walking and/or transfers;A little help with bathing/dressing/bathroom ?  ?Equipment Recommendations ? None recommended by OT  ?  ?Recommendations for Other Services   ? ?  ?Precautions / Restrictions Precautions ?Precautions: Fall ?Precaution Comments: watch HR ?Restrictions ?Weight Bearing Restrictions: No  ? ? ?  ? ?Mobility Bed Mobility ?Overal bed mobility: Modified Independent ?  ?  ?  ?  ?  ?  ?General bed mobility comments: pt reports SOB and not being able to lean back in the bed. Pt down in the bed. Pt with HOB flatten and pt long sitting. Pt able to side scoot toward the hob x2. pt with bed increased to chair position. pt reports breathing little better. pt anxious and telling the RN in room I need something i can't lay flat on that table. ?  ? ?Transfers ?  ?  ?  ?  ?  ?  ?  ?  ?  ?General transfer comment: deferred due to SOB  verbalized by pateint at this time. pt demonstrates need to pause between words to breath prior to continuing to talk. pt with transport present prior to any further attempts so session concluded ?  ?  ?Balance   ?  ?  ?  ?  ?  ?  ?  ?  ?  ?  ?  ?  ?  ?  ?  ?  ?  ?  ?   ? ?ADL either performed or assessed with clinical judgement  ? ?ADL Overall ADL's : Needs assistance/impaired ?  ?  ?Grooming: Set up;Bed level ?Grooming Details (indicate cue type and reason): completed oral care and reports mouth feels sore at this time. pt reports "been sore for 2 days" ?  ?  ?  ?  ?  ?  ?  ?  ?  ?  ?  ?  ?  ?  ?  ?General ADL Comments: oral care completed and transport arrivin ?  ? ?Extremity/Trunk Assessment Upper Extremity Assessment ?Upper Extremity Assessment: Overall WFL for tasks assessed ?  ?  ?  ?  ?  ? ?Vision   ?  ?  ?Perception   ?  ?Praxis   ?  ? ?Cognition Arousal/Alertness: Awake/alert ?Behavior During Therapy: Anxious ?Overall Cognitive Status: Within Functional Limits for tasks assessed ?  ?  ?  ?  ?  ?  ?  ?  ?  ?  ?  ?  ?  ?  ?  ?  ?  General Comments: pt expressing feeling like she can't breath with O2 saturations reading 96% [place] 2L ?  ?  ?   ?Exercises   ? ?  ?Shoulder Instructions   ? ? ?  ?General Comments SPO2 2L [place] 96%  ? ? ?Pertinent Vitals/ Pain       Pain Assessment ?Pain Assessment: No/denies pain ? ?Home Living   ?  ?  ?  ?  ?  ?  ?  ?  ?  ?  ?  ?  ?  ?  ?  ?  ?  ?  ? ?  ?Prior Functioning/Environment    ?  ?  ?  ?   ? ?Frequency ? Min 2X/week  ? ? ? ? ?  ?Progress Toward Goals ? ?OT Goals(current goals can now be found in the care plan section) ? Progress towards OT goals: Progressing toward goals ? ?Acute Rehab OT Goals ?Patient Stated Goal: to be able to breath ?OT Goal Formulation: With patient ?Time For Goal Achievement: 03/21/22 ?Potential to Achieve Goals: Good ?ADL Goals ?Pt Will Perform Upper Body Bathing: with modified independence;sitting ?Pt Will Perform Lower Body Bathing: with modified  independence;sit to/from stand ?Pt Will Transfer to Toilet: with modified independence;ambulating;regular height toilet  ?Plan Discharge plan remains appropriate   ? ?Co-evaluation ? ? ?   ?  ?  ?  ?  ? ?  ?AM-PAC OT "6 Clicks" Daily Activity     ?Outcome Measure ? ? Help from another person eating meals?: None ?Help from another person taking care of personal grooming?: None ?Help from another person toileting, which includes using toliet, bedpan, or urinal?: A Little ?Help from another person bathing (including washing, rinsing, drying)?: A Little ?Help from another person to put on and taking off regular upper body clothing?: A Little ?Help from another person to put on and taking off regular lower body clothing?: A Little ?6 Click Score: 20 ? ?  ?End of Session Equipment Utilized During Treatment: Oxygen ? ?OT Visit Diagnosis: Unsteadiness on feet (R26.81);Muscle weakness (generalized) (M62.81) ?  ?Activity Tolerance Patient tolerated treatment well ?  ?Patient Left in bed;Other (comment) (tranport and RN arriving to room) ?  ?Nurse Communication Mobility status ?  ? ?   ? ?Time: 6967-8938 ?OT Time Calculation (min): 16 min ? ?Charges: OT General Charges ?$OT Visit: 1 Visit ?OT Treatments ?$Self Care/Home Management : 8-22 mins ? ? ?[name], OTR/L  ?Acute Rehabilitation Services ?Pager: [phone] ?Office: [phone] ?. ? ? ?[name] ?03/14/2022, 9:43 AM ? ? ?

## 2022-03-14 NOTE — Progress Notes (Signed)
Hemodialysis- Treatment initiated via R AVF using 15g needles x2. Cannulated higher on venous access to attempt to rotate needle sites better. Cannulated without issue. Discussed keeping access arm still during treatment. Patient currently resting without complaints watching tv.  ?

## 2022-03-14 NOTE — Progress Notes (Signed)
Complained of shortness of breath, completed sentences, intermittent pulse ox done- 98 % on 2L , refused continuous pulse ox monitoring. Claimed that she feels anxious, atarax po given. Refused buspar due to side effects. MD aware. 930 am- transported to the cath lab by bed for fistulogram. ?

## 2022-03-15 ENCOUNTER — Telehealth: Payer: Self-pay | Admitting: Family Medicine

## 2022-03-15 DIAGNOSIS — I4891 Unspecified atrial fibrillation: Secondary | ICD-10-CM | POA: Diagnosis not present

## 2022-03-15 LAB — BASIC METABOLIC PANEL
Anion gap: 7 (ref 5–15)
BUN: 19 mg/dL (ref 8–23)
CO2: 29 mmol/L (ref 22–32)
Calcium: 8.3 mg/dL — ABNORMAL LOW (ref 8.9–10.3)
Chloride: 98 mmol/L (ref 98–111)
Creatinine, Ser: 6.07 mg/dL — ABNORMAL HIGH (ref 0.44–1.00)
GFR, Estimated: 7 mL/min — ABNORMAL LOW (ref >60)
Glucose, Bld: 122 mg/dL — ABNORMAL HIGH (ref 70–99)
Potassium: 3.7 mmol/L (ref 3.5–5.1)
Sodium: 134 mmol/L — ABNORMAL LOW (ref 135–145)

## 2022-03-15 LAB — GLUCOSE, CAPILLARY
Glucose-Capillary: 126 mg/dL — ABNORMAL HIGH (ref 70–99)
Glucose-Capillary: 234 mg/dL — ABNORMAL HIGH (ref 70–99)

## 2022-03-15 LAB — HEMOGLOBIN AND HEMATOCRIT, BLOOD
HCT: 29.8 % — ABNORMAL LOW (ref 36.0–46.0)
Hemoglobin: 9.7 g/dL — ABNORMAL LOW (ref 12.0–15.0)

## 2022-03-15 NOTE — Progress Notes (Signed)
?  Mobility Specialist Criteria Algorithm Info. ? ? 03/15/22 1030  ?Oxygen Therapy  ?SpO2 95 %  ?O2 Device Room Air  ?Patient Activity (if Appropriate) Ambulating  ?Pulse Oximetry Type Continuous (Probe reads best on L index finger with portable pulse ox)  ?Mobility  ?Activity Ambulated with assistance in hallway  ?Range of Motion/Exercises Active;All extremities  ?Level of Assistance Standby assist, set-up cues, supervision of patient - no hands on  ?Assistive Device Front wheel walker  ?Distance Ambulated (ft) 240 ft  ?Activity Response Tolerated well  ? ?Patient received in recliner chair eager to participate in mobility. Ambulated in hallway supervision level with slow steady gait. SaO2 maintained 95% throughout, was slightly SOB and wheezing w/ extended distance but tolerated without complaint or incident. Returned to room and was left in recliner chair with all needs met, call bell in reach. ? ?03/15/2022 ?10:45 AM ? ?[name], CMS, BS EXP ?Acute Rehabilitation Services  ?UYQ[facility]:[phone] ?Office: [phone] ? ?

## 2022-03-15 NOTE — Plan of Care (Signed)
?  Problem: Education: ?Goal: Knowledge of disease or condition will improve ?Outcome: Progressing ?Goal: Understanding of medication regimen will improve ?Outcome: Progressing ?Goal: Individualized Educational Video(s) ?Outcome: Progressing ?  ?Problem: Activity: ?Goal: Ability to tolerate increased activity will improve ?Outcome: Progressing ?  ?Problem: Cardiac: ?Goal: Ability to achieve and maintain adequate cardiopulmonary perfusion will improve ?Outcome: Progressing ?  ?Problem: Health Behavior/Discharge Planning: ?Goal: Ability to safely manage health-related needs after discharge will improve ?Outcome: Progressing ?  ?Problem: Education: ?Goal: Understanding of CV disease, CV risk reduction, and recovery process will improve ?Outcome: Progressing ?Goal: Individualized Educational Video(s) ?Outcome: Progressing ?  ?Problem: Activity: ?Goal: Ability to return to baseline activity level will improve ?Outcome: Progressing ?  ?Problem: Cardiovascular: ?Goal: Ability to achieve and maintain adequate cardiovascular perfusion will improve ?Outcome: Progressing ?Goal: Vascular access site(s) Level 0-1 will be maintained ?Outcome: Progressing ?  ?Problem: Health Behavior/Discharge Planning: ?Goal: Ability to safely manage health-related needs after discharge will improve ?Outcome: Progressing ?  ?Problem: Education: ?Goal: Understanding of CV disease, CV risk reduction, and recovery process will improve ?Outcome: Progressing ?Goal: Individualized Educational Video(s) ?Outcome: Progressing ?  ?Problem: Activity: ?Goal: Ability to return to baseline activity level will improve ?Outcome: Progressing ?  ?Problem: Cardiovascular: ?Goal: Ability to achieve and maintain adequate cardiovascular perfusion will improve ?Outcome: Progressing ?Goal: Vascular access site(s) Level 0-1 will be maintained ?Outcome: Progressing ?  ?Problem: Health Behavior/Discharge Planning: ?Goal: Ability to safely manage health-related needs after  discharge will improve ?Outcome: Progressing ?  ?Problem: Education: ?Goal: Knowledge of General Education information will improve ?Description: Including pain rating scale, medication(s)/side effects and non-pharmacologic comfort measures ?Outcome: Progressing ?  ?Problem: Health Behavior/Discharge Planning: ?Goal: Ability to manage health-related needs will improve ?Outcome: Progressing ?  ?Problem: Clinical Measurements: ?Goal: Ability to maintain clinical measurements within normal limits will improve ?Outcome: Progressing ?Goal: Will remain free from infection ?Outcome: Progressing ?Goal: Diagnostic test results will improve ?Outcome: Progressing ?Goal: Respiratory complications will improve ?Outcome: Progressing ?Goal: Cardiovascular complication will be avoided ?Outcome: Progressing ?  ?Problem: Activity: ?Goal: Risk for activity intolerance will decrease ?Outcome: Progressing ?  ?Problem: Nutrition: ?Goal: Adequate nutrition will be maintained ?Outcome: Progressing ?  ?Problem: Coping: ?Goal: Level of anxiety will decrease ?Outcome: Progressing ?  ?Problem: Elimination: ?Goal: Will not experience complications related to bowel motility ?Outcome: Progressing ? ?Problem: Elimination: ?Goal: Will not experience complications related to urinary retention ?Outcome: Progressing ?  ?Problem: Pain Managment: ?Goal: General experience of comfort will improve ?Outcome: Progressing ?  ?Problem: Safety: ?Goal: Ability to remain free from injury will improve ?Outcome: Progressing ?  ?Problem: Skin Integrity: ?Goal: Risk for impaired skin integrity will decrease ?Outcome: Progressing ?  ?

## 2022-03-15 NOTE — Progress Notes (Signed)
Physical Therapy Treatment ?Patient Details ?Name: [name] ?MRN: [id number] ?DOB: 20-Dec-1950 ?Today's Date: 03/15/2022 ? ? ?History of Present Illness 71 y/o female adm 4/1 with chest pressure, tightness and SOB with new onset A-fib with RVR. 4/5 heart cath via left fem.PMHx: CKD on HD, CAD, CHF, COPD, HTN, PVD, neuropathy, CVA. ? ?  ?PT Comments  ? ? Pt received in recliner, agreeable to therapy session with good participation after initial encouragement for gait and stair training. Pt mildly unsteady without RW when wearing clog-style shoes and at times using furniture for support in room/hallway, encouraged her to utilize RW for safety with shoes that don't have a heel strap to reduce her fall risk. Pt needing up to min guard for gait and Supervision for sit<>stand from chair. Pt performed stairs x2 per home setup with up to minA support, pt daughter present and receptive to instruction as well. Pt continues to benefit from PT services to progress toward functional mobility goals.   ?Recommendations for follow up therapy are one component of a multi-disciplinary discharge planning process, led by the attending physician.  Recommendations may be updated based on patient status, additional functional criteria and insurance authorization. ? ?Follow Up Recommendations ? Home health PT ?  ?  ?Assistance Recommended at Discharge Intermittent Supervision/Assistance  ?Patient can return home with the following A little help with walking and/or transfers;A little help with bathing/dressing/bathroom;Assistance with cooking/housework;Assist for transportation;Help with stairs or ramp for entrance ?  ?Equipment Recommendations ? None recommended by PT  ?  ?Recommendations for Other Services   ? ? ?  ?Precautions / Restrictions Precautions ?Precautions: Fall ?Precaution Comments: watch HR ?Restrictions ?Weight Bearing Restrictions: No  ?  ? ?Mobility ? Bed Mobility ?  ?  ?  ?  ?  ?  ?  ?General bed mobility comments: pt  received in chair ?  ? ?Transfers ?Overall transfer level: Needs assistance ?Equipment used: None ?Transfers: Sit to/from Stand ?Sit to Stand: Supervision ?  ?  ?  ?  ?  ?General transfer comment: pt reports she has not been using RW, no LOB upon standing ?  ? ?Ambulation/Gait ?Ambulation/Gait assistance: Supervision, Min guard ?Gait Distance (Feet): 70 Feet ?Assistive device: None ?Gait Pattern/deviations: Step-through pattern, Decreased stride length, Staggering right ?  ?  ?  ?General Gait Details: lateral instability without AD, mild LOB to R side x2 occasions, pt using railing in hallway with increased fatigue, defers RW but instructed on benefit of RW for fall risk prevention. ? ? ?Stairs ?Stairs: Yes ?Stairs assistance: Min assist ?Stair Management: One rail Left, Forwards, Step to pattern ?Number of Stairs: 2 ?General stair comments: cues for sequence and activity pacing, pt needing minA for stability with single UE support ? ? ?Wheelchair Mobility ?  ? ?Modified Rankin (Stroke Patients Only) ?  ? ? ?  ?Balance Overall balance assessment: Needs assistance ?Sitting-balance support: Feet supported, No upper extremity supported ?  ?  ?  ?Standing balance support: During functional activity ?Standing balance-Leahy Scale: Fair ?Standing balance comment: pt defers RW, no significant LOB but mildly unstable with clog shoes, recommend she use shoes with heel strap for safety when not using RW, pt receptive ?  ?  ?  ?  ?  ?  ?  ?  ?  ?  ?  ?  ? ?  ?Cognition Arousal/Alertness: Awake/alert ?Behavior During Therapy: [facility] for tasks assessed/performed ?Overall Cognitive Status: Within Functional Limits for tasks assessed ?  ?  ?  ?  ?  ?  ?  ?  ?  ?  ?  ?  ?  ?  ?  ?  ?  General Comments: Calm, joking, cooperative with encouragement ?  ?  ? ?  ?Exercises   ? ?  ?General Comments General comments (skin integrity, edema, etc.): SpO2 WFL on RA this session, inconsistent pleth signal but when reading 94-100% on RA, HR to 88 bpm  DOE 1/4 ?  ?  ? ?Pertinent Vitals/Pain Pain Assessment ?Pain Assessment: No/denies pain ?Pain Intervention(s): Monitored during session  ? ? ?   ?   ? ?PT Goals (current goals can now be found in the care plan section) Acute Rehab PT Goals ?Patient Stated Goal: for my arm to stop bleeding and to go home ?PT Goal Formulation: With patient ?Time For Goal Achievement: 03/21/22 ?Progress towards PT goals: Progressing toward goals ? ?  ?Frequency ? ? ? Min 3X/week ? ? ? ?  ?PT Plan Current plan remains appropriate  ? ? ?   ?AM-PAC PT "6 Clicks" Mobility   ?Outcome Measure ? Help needed turning from your back to your side while in a flat bed without using bedrails?: None ?Help needed moving from lying on your back to sitting on the side of a flat bed without using bedrails?: None ?Help needed moving to and from a bed to a chair (including a wheelchair)?: None ?Help needed standing up from a chair using your arms (e.g., wheelchair or bedside chair)?: A Little ?Help needed to walk in hospital room?: A Little ?Help needed climbing 3-5 steps with a railing? : A Little ?6 Click Score: 21 ? ?  ?End of Session   ?Activity Tolerance: Patient tolerated treatment well ?Patient left: with call bell/phone within reach;in chair;with family/visitor present ?Nurse Communication: Mobility status;Other (comment) (pt has stool sample in bathroom to be collected, pt asking about arm bandage) ?PT Visit Diagnosis: Muscle weakness (generalized) (M62.81);Difficulty in walking, not elsewhere classified (R26.2);Other abnormalities of gait and mobility (R26.89) ?  ? ? ?Time: 1420-1440 ?PT Time Calculation (min) (ACUTE ONLY): 20 min ? ?Charges:  $Gait Training: 8-22 mins          ?          ? ?Myonna Chisom P., PTA ?Acute Rehabilitation Services ?Secure Chat Preferred 9a-5:30pm ?Office: [phone]  ? ? ?Shevonne Wolf M Rilan Eiland ?03/15/2022, 3:00 PM ? ?

## 2022-03-15 NOTE — Progress Notes (Signed)
D/C order noted. Contacted [facility] and spoke to [place]. Clinic advised of pt's d/c today and that pt will resume care tomorrow. D/C summary and last renal noted faxed to clinic for continuation of care.  ? ?Melven Sartorius ?Renal Navigator ?[phone] ?

## 2022-03-15 NOTE — Plan of Care (Signed)
?  Problem: Education: ?Goal: Knowledge of disease or condition will improve ?Outcome: Adequate for Discharge ?Goal: Understanding of medication regimen will improve ?Outcome: Adequate for Discharge ?  ?Problem: Activity: ?Goal: Ability to tolerate increased activity will improve ?Outcome: Adequate for Discharge ?  ?Problem: Cardiac: ?Goal: Ability to achieve and maintain adequate cardiopulmonary perfusion will improve ?Outcome: Adequate for Discharge ?  ?Problem: Education: ?Goal: Understanding of CV disease, CV risk reduction, and recovery process will improve ?Outcome: Adequate for Discharge ?  ?Problem: Activity: ?Goal: Ability to return to baseline activity level will improve ?Outcome: Adequate for Discharge ?  ?Problem: Cardiovascular: ?Goal: Ability to achieve and maintain adequate cardiovascular perfusion will improve ?Outcome: Adequate for Discharge ?Goal: Vascular access site(s) Level 0-1 will be maintained ?Outcome: Adequate for Discharge ?  ?Problem: Health Behavior/Discharge Planning: ?Goal: Ability to safely manage health-related needs after discharge will improve ?Outcome: Adequate for Discharge ?  ?Problem: Education: ?Goal: Understanding of CV disease, CV risk reduction, and recovery process will improve ?Outcome: Adequate for Discharge ?  ?Problem: Activity: ?Goal: Ability to return to baseline activity level will improve ?Outcome: Adequate for Discharge ?  ?Problem: Cardiovascular: ?Goal: Ability to achieve and maintain adequate cardiovascular perfusion will improve ?Outcome: Adequate for Discharge ?Goal: Vascular access site(s) Level 0-1 will be maintained ?Outcome: Adequate for Discharge ?  ?Problem: Health Behavior/Discharge Planning: ?Goal: Ability to safely manage health-related needs after discharge will improve ?Outcome: Adequate for Discharge ?  ?Problem: Education: ?Goal: Knowledge of General Education information will improve ?Description: Including pain rating scale, medication(s)/side  effects and non-pharmacologic comfort measures ?Outcome: Adequate for Discharge ?  ?Problem: Health Behavior/Discharge Planning: ?Goal: Ability to manage health-related needs will improve ?Outcome: Adequate for Discharge ?  ?Problem: Clinical Measurements: ?Goal: Ability to maintain clinical measurements within normal limits will improve ?Outcome: Adequate for Discharge ?Goal: Will remain free from infection ?Outcome: Adequate for Discharge ?Goal: Diagnostic test results will improve ?Outcome: Adequate for Discharge ?Goal: Respiratory complications will improve ?Outcome: Adequate for Discharge ?Goal: Cardiovascular complication will be avoided ?Outcome: Adequate for Discharge ?  ?Problem: Activity: ?Goal: Risk for activity intolerance will decrease ?Outcome: Adequate for Discharge ?  ?Problem: Nutrition: ?Goal: Adequate nutrition will be maintained ?Outcome: Adequate for Discharge ?  ?Problem: Coping: ?Goal: Level of anxiety will decrease ?Outcome: Adequate for Discharge ?  ?Problem: Elimination: ?Goal: Will not experience complications related to bowel motility ?Outcome: Adequate for Discharge ?Goal: Will not experience complications related to urinary retention ?Outcome: Adequate for Discharge ?  ?Problem: Pain Managment: ?Goal: General experience of comfort will improve ?Outcome: Adequate for Discharge ?  ?Problem: Safety: ?Goal: Ability to remain free from injury will improve ?Outcome: Adequate for Discharge ?  ?Problem: Skin Integrity: ?Goal: Risk for impaired skin integrity will decrease ?Outcome: Adequate for Discharge ?  ?

## 2022-03-15 NOTE — Progress Notes (Signed)
?[facility] ?Progress Note  ? ?Assessment/ Plan:   ? OP HD: [facility] ?  4h   84kg   350/500  RUE AVF  Heparin 1000u/hr ? - 4/04 > hep B Ag neg, and hep B Abs were low/ not protective ? - rocaltrol 1.75 ug tiw ? - mircera 50 ug q 4 wks ? - venofer 50mg  weekly ?  ?  ?Assessment/ Plan: ?Chest pain: presented in a.fib with RVR. On diltiazem. Rate controlled at present. Management per primary/cardiology. SP LHC which showed severe 2V CAD. Seen by TCTS, too high risk for surgery. Had atherectomy/ PCI to tight LAD and RCA lesions on 4/05. Added brilinta post cath.  ? ESRD:  on HD TTS. HD tomorrow on schedule ? Bleeding - prolonged access bleeding after HD Sat.  VVS consulting, fgram 4/11 ? Hypertension/volume: BPs okay. Diltiazem added for afib. Given BP drops on dialysis, cardiology recommended special dosing > holding atenolol the morning of dialysis and the prior evening, and give the long-acting po diltiazem every evening. Seems to have helped w/ BP's in HD here. LVEDP from cath 4/03 was up at 30 mmHg. Did extra HD this past week and new edw should be around 82kg or possibly lower-  got to 33 yesterday post HD ?Afib w/ RVR - new onset here, now on eliquis, and diltiazem added to atenolol. Per cards.  ? Anemia: Hgb 9-12 here. No esa needs.  ? Metabolic bone disease: Calcium controlled. Phos down. Continue calcium acetate. Cont rocaltrol.  ?8.  Dispo: ok to d/c from renal perspective  ? ?Subjective:   ? ?HD yest-  removed 3000- tolerated well -  she still c/o occasional SOB today -  is hoping they will send her home on O2  ? ?Objective:   ?BP 126/67 (BP Location: Left Arm)   Pulse 80   Temp 97.8 ?F (36.6 ?C) (Oral)   Resp 17   Ht 5\' 3"  (1.6 m)   Wt 80.9 kg   SpO2 100%   BMI 31.59 kg/m?  ? ?Physical Exam: ?Gen: NAD, sitting in bed, on HD ?CVS: RRR ?Resp: clear ?Abd: soft ?Ext: no LE edema ?ACCESS: R AVF  bruised but patent ? ?Labs: ?BMET ?Recent Labs  ?Lab 03/09/22 ?0106 03/09/22 ?1416  03/10/22 ?5009 03/10/22 ?1521 03/11/22 ?0041 03/13/22 ?0111 03/14/22 ?3818 03/15/22 ?0056  ?NA 128* 133*  --  129* 134* 132* 128* 134*  ?K 4.5 4.2  --  4.8 3.8 4.0 4.3 3.7  ?CL 93* 97*  --  93* 96* 95* 92* 98  ?CO2 19* 23  --  25 31 28 25 29   ?GLUCOSE 136* 211*  --  209* 175* 159* 167* 122*  ?BUN 52* 18  --  29* 12 23 36* 19  ?CREATININE 9.70* 5.30*  --  8.08* 4.76* 7.04* 8.85* 6.07*  ?CALCIUM 8.6* 8.8*  --  9.1 9.0 9.4 9.2 8.3*  ?PHOS  --   --  4.6 5.3*  --   --   --   --   ? ?CBC ?Recent Labs  ?Lab 03/11/22 ?0041 03/12/22 ?2993 03/13/22 ?0111 03/14/22 ?7169 03/14/22 ?1810  ?WBC 12.7* 11.5* 10.2 12.4*  --   ?HGB 9.3* 9.1* 9.1* 9.0* 9.9*  ?HCT 29.2* 28.5* 28.3* 27.5* 31.0*  ?MCV 88.0 89.1 89.6 87.0  --   ?PLT 183 180 197 189  --   ? ? ?  ?Medications:   ? ? apixaban  5 mg Oral BID  ? atenolol  50 mg  Oral Once per day on Mon Wed Fri Sat  ? atenolol  50 mg Oral Once per day on Sun Tue Thu Sat  ? calcium acetate  1,334 mg Oral TID with meals  ? diltiazem  180 mg Oral QPM  ? famotidine  20 mg Oral QHS  ? gabapentin  400 mg Oral Q T,Th,Sat-1800  ? insulin aspart  0-5 Units Subcutaneous QHS  ? insulin aspart  0-6 Units Subcutaneous TID WC  ? insulin detemir  15 Units Subcutaneous QHS  ? pantoprazole  40 mg Oral Daily  ? polyethylene glycol  17 g Oral Daily  ? QUEtiapine  100 mg Oral QHS  ? rOPINIRole  1 mg Oral QHS  ? rosuvastatin  5 mg Oral Daily  ? senna-docusate  1 tablet Oral BID  ? sodium chloride flush  3 mL Intravenous Q12H  ? sodium chloride flush  3 mL Intravenous Q12H  ? ticagrelor  90 mg Oral BID  ? torsemide  20 mg Oral Daily  ? ? ?[name] ? ?03/15/2022, 8:21 AM   ? ? ?

## 2022-03-15 NOTE — Progress Notes (Signed)
SATURATION QUALIFICATIONS: (This note is used to comply with regulatory documentation for home oxygen) ? ?Patient Saturations on Room Air at Rest = 98% ? ?Patient Saturations on Room Air while Ambulating = 95% ? ?Patient Saturations on 0 Liters of oxygen while Ambulating = n/a% ? ?Please briefly explain why patient needs home oxygen: ?

## 2022-03-15 NOTE — Discharge Summary (Signed)
?Physician Discharge Summary ?  ?Patient: [name] MRN: [id number] DOB: 1950-12-11  ?Admit date:     03/04/2022  ?Discharge date: 03/15/22  ?Discharge Physician: Antonieta Pert  ? ?PCP: [name], MD  ? ?Recommendations at discharge:  ? ?Follow-up with your primary care provider in 1 week.   ?Follow-up with cardiology as scheduled by the clinic  ? continue hemo-dialysis as outpatient. ?Check CBC in 1 week ? ?Discharge Diagnoses: ?Principal Problem: ?  Atrial fibrillation with RVR ([place]) ?Active Problems: ?  Unstable angina ([place]) ?  End stage renal disease on dialysis due to type 2 diabetes mellitus ([place]) ?  Pulmonary hypertension ([place]) ?  Type II diabetes mellitus with nephropathy ([place]) ?  Hypertension associated with diabetes ([place]) ?  Hyperlipidemia associated with type 2 diabetes mellitus ([place]) ?  Obesity (BMI 30.0-34.9) ?  Leukocytosis ?  GERD (gastroesophageal reflux disease) ?  Hypokalemia ?  Physical deconditioning ?  Hyponatremia ? ?Resolved Problems: ?  * No resolved hospital problems. * ? ?Hospital Course: ?Patient is a 71 years old female with history of COPD, hypertension, diabetes mellitus, end-stage renal disease on hemodialysis presented to hospital with chest discomfort and new onset atrial fibrillation.  In the ED, patient was noted to have elevated troponin.  Patient underwent cardiac catheterization on 03/06/2022 which showed severe two-vessel disease involving RCA and LAD.  Patient was thought to be high risk for CT surgery so  underwent PCI on 03/08/2022. Nephrology followed the patient for dialysis needs. ?Patient had some blood in the toilet so discharge was held on 03/14/22 pm, monitored overnight hemoglobin up trended up, complaints of pinkish hematuria stool is brown, I discussed in detail with Dr. Moshe Cipro with nephrology and Dr. Eleonore Chiquito with cardiology at this time we cannot stop patient's anticoagulation.  Patient is recommended and advised to monitor the bleeding if it is worse  or any pain advised to follow-up with PCP cardiology or return to the ED.  She is doing well on room air.  She is being discharged home in medically stable condition. ? ?Assessment and Plan: ?Atrial fibrillation with RVR  ?Rate controlled at this time.  Patient was seen by cardiology during hospitalization.  Patient has been adjusted on atenolol Eliquis and long-acting Cardizem..  2D echocardiogram at this time showed normal EF but severe pulmonary hypertension. TSH 4.7.   ?  ?Unstable angina ?Patient underwent cardiac catheterization on 03/06/2022 with  severe two-vessel disease involving LAD and RCA.  Patient subsequently underwent cardiac catheterization and coronary atherectomy, PCI and stenting of the severe stenosis in the mid LAD, RCA on 03/08/2022.  Continue  Brilinta, beta-blocker, statins, Eliquis..  Patient was thought to be high risk for surgery as per cardiothoracic surgery. Due to A-fib plan is for Eliquis and Brilinta for 3 months and then transition to aspirin.Patient will follow-up with cardiology in 1 to 2 weeks after discharge ? ?Hematuria without drop in hemoglobin- urine pinkish.  Advised to monitor and follow-up.  At this time unfortunately cannot  discontinue patient anticoagulation given cardiac procedure.   ? ?End stage renal disease on dialysis due to type 2 diabetes mellitus ([place]) ?Nephrology was on board for hemodialysis need.  On Tuesday Thursday Saturday schedule at baseline.     ?  ?Right upper extremity fistula site bleeding.  Seen by vascular surgery.  Underwent fistulogram and vascular procedure on 03/14/2022.   ?  ?Pulmonary hypertension ([place]) ?Does have history of COPD likely causing pulmonary hypertension.  RV systolic pressure was 61.  On supplemental oxygen, will likely need on discharge. ?  ?Type II diabetes mellitus with nephropathy ([place]) ?Patient uses 25 units of basal insulin at home and was reduced to 15 units with sliding scale while in the hospital.  Continue gabapentin for  neuropathy.  POC glucose of 140. ?  ?Hypertension associated with diabetes ([place]) ?Continue atenolol, long-acting Cardizem.   Cardiology suggesting holding atenolol on the morning of hemodialysis and  and using Cardizem in the evening postdialysis for episodes of hypotension during dialysis. ?  ?Hypokalemia ?Improved after replacement.  Latest potassium 4.3 ?  ?Hyponatremia.  On hemodialysis.  Has been started on torsemide as well.  Sodium level 128. ?  ?GERD (gastroesophageal reflux disease) ?Continue Pepcid and Protonix. ?  ?Leukocytosis ?  Afebrile.  No signs of infection at this time.  Chest x-ray without any acute infiltrate.  Will need outpatient monitoring. ?  ?Obesity (BMI 30.0-34.9) ?BMI 31.05.  Would benefit from weight loss as outpatient. ?  ?Hyperlipidemia associated with type 2 diabetes mellitus ([place]) ?Continue statin ?  ?Weakness and deconditioning.  Physical therapy has seen the patient and the plan is home health on discharge. ? ?Consultants:  ?Nephrology ?Vascular surgery ?Cardiology ?Cardiothoracic surgery ? ?Procedures performed:  ?Cardiac catheterization on 03/06/2022  ?Cardiac catheterization on 03/08/2022 with coronary atherectomy, PCI to mid LAD and RCA  ? ?Disposition: Home health ? ?Diet recommendation:  ?Discharge Diet Orders (From admission, onward)  ? ?  Start     Ordered  ? 03/10/22 0000  Diet - low sodium heart healthy       ? 03/10/22 1010  ? 03/10/22 0000  Diet Carb Modified       ? 03/10/22 1010  ? ?  ?  ? ?  ? ?Cardiac and Carb modified diet ?DISCHARGE MEDICATION: ?Allergies as of 03/15/2022   ? ?   Reactions  ? Azithromycin   ? Prolonged QT  ? Ace Inhibitors Cough  ? Clopidogrel Bisulfate Other (See Comments)  ? Sick, Headache, "Felt terrible"  ? Codeine Other (See Comments)  ? hallucinations  ? Lisinopril Cough  ? Penicillins Other (See Comments)  ? Unknown reaction ?Did it involve swelling of the face/tongue/throat, SOB, or low BP? Unknown ?Did it involve sudden or severe rash/hives,  skin peeling, or any reaction on the inside of your mouth or nose? Unknown ?Did you need to seek medical attention at a hospital or doctor's office? Unknown ?When did it last happen?      childhood allergy ?If all above answers are "NO", may proceed with cephalosporin use.  ? ?  ? ?  ?Medication List  ?  ? ?STOP taking these medications   ? ?aspirin 81 MG EC tablet ?  ?busPIRone 15 MG tablet ?Commonly known as: BUSPAR ?  ?NIFEdipine 60 MG 24 hr tablet ?Commonly known as: PROCARDIA XL/NIFEDICAL XL ?  ?predniSONE 50 MG tablet ?Commonly known as: DELTASONE ?  ? ?  ? ?TAKE these medications   ? ?acetaminophen 500 MG tablet ?Commonly known as: TYLENOL ?Take 1,000 mg by mouth every 6 (six) hours as needed for moderate pain or headache. ?  ?albuterol 108 (90 Base) MCG/ACT inhaler ?Commonly known as: VENTOLIN HFA ?Inhale 2 puffs into the lungs every 6 (six) hours as needed for wheezing or shortness of breath. ?  ?albuterol (2.5 MG/3ML) 0.083% nebulizer solution ?Commonly known as: PROVENTIL ?Take 3 mLs (2.5 mg total) by nebulization every 6 (six) hours as needed for wheezing or shortness of breath. ?  ?apixaban  5 MG Tabs tablet ?Commonly known as: ELIQUIS ?Take 1 tablet (5 mg total) by mouth 2 (two) times daily. ?  ?atenolol 50 MG tablet ?Commonly known as: TENORMIN ?Take 1 tablet (50 mg total) by mouth 2 (two) times daily. Skip on the morning of hemodialysis ?What changed: additional instructions ?  ?Basaglar KwikPen 100 UNIT/ML ?Inject 25 Units into the skin at bedtime. Add five units each time the fasting is over 125 three days in a row. ?  ?budesonide-formoterol 160-4.5 MCG/ACT inhaler ?Commonly known as: SYMBICORT ?Inhale 2 puffs into the lungs 2 (two) times daily. ?  ?calcium acetate 667 MG capsule ?Commonly known as: PHOSLO ?Take 667-1,334 mg by mouth See admin instructions. Take 1334 mg with meals 3 times daily and 667 mg with snacks ?  ?cetirizine 10 MG tablet ?Commonly known as: ZYRTEC ?Take 1 tablet (10 mg total)  by mouth daily as needed for allergies. ?  ?diltiazem 180 MG 24 hr capsule ?Commonly known as: CARDIZEM CD ?Take 1 capsule (180 mg total) by mouth every evening. ?  ?docusate sodium 100 MG capsule ?Comm

## 2022-03-16 ENCOUNTER — Other Ambulatory Visit: Payer: Self-pay

## 2022-03-16 ENCOUNTER — Emergency Department (HOSPITAL_COMMUNITY)
Admission: EM | Admit: 2022-03-16 | Discharge: 2022-03-16 | Disposition: A | Discharge status: Home / Self Care | Payer: Medicare Other | Attending: Emergency Medicine | Admitting: Emergency Medicine

## 2022-03-16 ENCOUNTER — Telehealth: Payer: Self-pay | Admitting: Cardiology

## 2022-03-16 ENCOUNTER — Encounter (HOSPITAL_COMMUNITY): Payer: Self-pay

## 2022-03-16 ENCOUNTER — Encounter: Payer: Self-pay | Admitting: Cardiology

## 2022-03-16 DIAGNOSIS — Z79899 Other long term (current) drug therapy: Secondary | ICD-10-CM | POA: Insufficient documentation

## 2022-03-16 DIAGNOSIS — Z992 Dependence on renal dialysis: Secondary | ICD-10-CM | POA: Insufficient documentation

## 2022-03-16 DIAGNOSIS — N186 End stage renal disease: Secondary | ICD-10-CM | POA: Diagnosis not present

## 2022-03-16 DIAGNOSIS — T82838A Hemorrhage of vascular prosthetic devices, implants and grafts, initial encounter: Secondary | ICD-10-CM | POA: Diagnosis not present

## 2022-03-16 DIAGNOSIS — N2581 Secondary hyperparathyroidism of renal origin: Secondary | ICD-10-CM | POA: Diagnosis not present

## 2022-03-16 DIAGNOSIS — S41111A Laceration without foreign body of right upper arm, initial encounter: Secondary | ICD-10-CM | POA: Diagnosis not present

## 2022-03-16 DIAGNOSIS — Z794 Long term (current) use of insulin: Secondary | ICD-10-CM | POA: Diagnosis not present

## 2022-03-16 DIAGNOSIS — R9431 Abnormal electrocardiogram [ECG] [EKG]: Secondary | ICD-10-CM | POA: Diagnosis not present

## 2022-03-16 DIAGNOSIS — Z23 Encounter for immunization: Secondary | ICD-10-CM | POA: Diagnosis not present

## 2022-03-16 DIAGNOSIS — D509 Iron deficiency anemia, unspecified: Secondary | ICD-10-CM | POA: Diagnosis not present

## 2022-03-16 DIAGNOSIS — Z7901 Long term (current) use of anticoagulants: Secondary | ICD-10-CM | POA: Insufficient documentation

## 2022-03-16 DIAGNOSIS — N250 Renal osteodystrophy: Secondary | ICD-10-CM | POA: Diagnosis not present

## 2022-03-16 MED ORDER — LIDOCAINE HCL (PF) 1 % IJ SOLN
INTRAMUSCULAR | Status: AC
  Filled 2022-03-16: qty 5

## 2022-03-16 MED ORDER — HYDROXYZINE HCL 25 MG PO TABS
25.0000 mg | ORAL_TABLET | Freq: Once | ORAL | Status: AC
  Administered 2022-03-16: 25 mg via ORAL
  Filled 2022-03-16: qty 1

## 2022-03-16 NOTE — ED Notes (Signed)
MD in room at time of triage for MSE. 

## 2022-03-16 NOTE — Telephone Encounter (Signed)
Pt and daughter called to follow up on mychart message sent below.  ? ?Mychart message ?I am in the hospital at [facility]. I have been bleeding since last night. I went to dialysis and when they pulled the needles out, the bleeding wouldn?t stop. The MD at the hospital finally put a stitch in and the blood was shooting everywhere.  I understand that the two medications are doing different things, however it seems to be too much. I am wondering if I should stop the Eliquise(spelling prob not correct) please advise, I am in the hospital right now. I wonder if this going to happen every time I have dialysis.  ?

## 2022-03-16 NOTE — Telephone Encounter (Signed)
Patient currently hospitalized.  Those providers, along with Dr. Percival Spanish, will need to make the decision regarding restarting Eliquis.  No decision can be made by [place] while she is hospitalized.  ?

## 2022-03-16 NOTE — ED Triage Notes (Signed)
Patient to ED via EMS with complaints of bleeding from shunt to left arm. States that it has been bleeding since 1100. Clamp and pressure dressing in place at this time. Patient states that she has three spots that have been bleeding off and on for the week. Currently on blood thinners. ?

## 2022-03-16 NOTE — ED Provider Notes (Signed)
?  Provider Note ?MRN:  [id number]  ?Arrival date & time: 03/16/22    ?ED Course and Medical Decision Making  ?Assumed care from [facility] at shift change. ? ?See not from prior team for complete details, in brief:  ? ?71 yo female, ESRD on HD  ?Bleeding HD access, 2 sites, mx days ?Suture placed in ED by prior EDP ?No further bleeding on re-assessment ?Stable for Dc ? ?The patient improved significantly and was discharged in stable condition. Detailed discussions were had with the patient regarding current findings, and need for close f/u with PCP or on call doctor. The patient has been instructed to return immediately if the symptoms worsen in any way for re-evaluation. Patient verbalized understanding and is in agreement with current care plan. All questions answered prior to discharge. ? ? ? ? ?Procedures ? ?Final Clinical Impressions(s) / ED Diagnoses  ? ?  ICD-10-CM   ?1. Hemorrhage of arteriovenous fistula, initial encounter [facility])  T82.838A   ?  ?  ?ED Discharge Orders   ? ? None  ? ?  ?  ? ? ?Discharge Instructions   ? ?  ?If you are going to stop a blood thinner, you should stop the Eliquis.  However note that this does increase your risk of stroke and so you will have to weigh the risks of bleeding, especially with dialysis, versus the risk of stroke coming off of this.  It is important to talk to your cardiologist about this as well. ? ? ? ? ? ? ?  ?Jeanell Sparrow, DO ?03/16/22 1623 ? ?

## 2022-03-16 NOTE — Telephone Encounter (Signed)
Patient's daughter is called about her mother's Eliquis and brilinta.  She states a lot of blood comes out when they stop the dialysis.   When the needle comes out they can't stop it.  They had to do a stitch in it today.  She wants to know what she should do as far as taking the medication.  You can call patient back at cell [phone] ?

## 2022-03-16 NOTE — Discharge Instructions (Addendum)
If you are going to stop a blood thinner, you should stop the Eliquis.  However note that this does increase your risk of stroke and so you will have to weigh the risks of bleeding, especially with dialysis, versus the risk of stroke coming off of this.  It is important to talk to your cardiologist about this as well. ?

## 2022-03-16 NOTE — Telephone Encounter (Signed)
-  Spoke with pharmacist Gerald Stabs and informed pt has already been discharged from the hospital. ?-Per Gerald Stabs, he recommends hold Eliquis dose for tonight and tomorrow. Then restart on Saturday. If bleeding reoccur, pt will need to report back to ER. ?-Pt and daughter made aware and verbalized understanding.  ?

## 2022-03-16 NOTE — ED Provider Notes (Signed)
?[facility] ?Provider Note ? ? ?CSN: [id number] ?Arrival date & time: 03/16/22  1352 ? ?  ? ?History ? ?Chief Complaint  ?Patient presents with  ? Shunt bleeding  ? ? ?[name] is a 71 y.o. female. ? ?HPI ?71 year old female presents with dialysis fistula bleeding.  She received her dialysis today and dialysis center has been trying to stop bleeding on and off for about an hour.  They reported squirting/arterial bleeding.  Currently she has a clamp on it. ? ?She also notes that she has had bleeding more so proximal to this ever since her dialysis 2 days ago.  She was recently admitted to the hospital and had a procedure on this fistula as well as a cardiac cath and a stent placement and was placed on Brilinta in addition to her Eliquis for A-fib. ? ? ?Home Medications ?Prior to Admission medications   ?Medication Sig Start Date End Date Taking? Authorizing Provider  ?acetaminophen (TYLENOL) 500 MG tablet Take 1,000 mg by mouth every 6 (six) hours as needed for moderate pain or headache.    [provider]  ?albuterol (PROVENTIL) (2.5 MG/3ML) 0.083% nebulizer solution Take 3 mLs (2.5 mg total) by nebulization every 6 (six) hours as needed for wheezing or shortness of breath. 12/02/21   [name], MD  ?albuterol (VENTOLIN HFA) 108 (90 Base) MCG/ACT inhaler Inhale 2 puffs into the lungs every 6 (six) hours as needed for wheezing or shortness of breath. 12/02/21   [name], MD  ?apixaban (ELIQUIS) 5 MG TABS tablet Take 1 tablet (5 mg total) by mouth 2 (two) times daily. 03/10/22   Pokhrel, Corrie Mckusick, MD  ?atenolol (TENORMIN) 50 MG tablet Take 1 tablet (50 mg total) by mouth 2 (two) times daily. Skip on the morning of hemodialysis 03/10/22   Pokhrel, Corrie Mckusick, MD  ?budesonide-formoterol (SYMBICORT) 160-4.5 MCG/ACT inhaler Inhale 2 puffs into the lungs 2 (two) times daily. 11/15/21   [name], MD  ?calcium acetate (PHOSLO) 667 MG capsule Take 667-1,334 mg by mouth See admin  instructions. Take 1334 mg with meals 3 times daily and 667 mg with snacks    [provider]  ?Carboxymethylcellul-Glycerin (LUBRICATING EYE DROPS OP) Place 1 drop into both eyes daily as needed (dry eyes).    [provider]  ?cetirizine (ZYRTEC) 10 MG tablet Take 1 tablet (10 mg total) by mouth daily as needed for allergies. 02/08/22   [name], MD  ?Continuous Blood Gluc Receiver (FREESTYLE LIBRE 2 READER) DEVI USE TO TEST BLOOD SUGAR CONTINUOUSLY 11/15/21   [name], MD  ?Continuous Blood Gluc Sensor (FREESTYLE LIBRE 2 SENSOR) MISC Use multiple times daily to track glucose to prevent highs and lows as a complication of dialysis Dx:Z99.2, E11.59 09/15/21   [name], MD  ?diltiazem (CARDIZEM CD) 180 MG 24 hr capsule Take 1 capsule (180 mg total) by mouth every evening. 03/10/22 04/09/22  Pokhrel, Corrie Mckusick, MD  ?docusate sodium (COLACE) 100 MG capsule Take 1 capsule (100 mg total) by mouth 2 (two) times daily for 10 days. 03/10/22 03/20/22  Pokhrel, Corrie Mckusick, MD  ?famotidine (PEPCID) 20 MG tablet Take 1 tablet (20 mg total) by mouth 2 (two) times daily. ?Patient taking differently: Take 20 mg by mouth at bedtime. 02/15/22   Dettinger, Fransisca Kaufmann, MD  ?gabapentin (NEURONTIN) 400 MG capsule Take 1 capsule (400 mg total) by mouth See admin instructions. Take it after HD 02/21/21   Barton Dubois, MD  ?glucose blood (ONETOUCH ULTRA) test strip Use  to test blood sugar 3 times daily as directed. DX: E11.9 09/13/20   [name], MD  ?hydrOXYzine (ATARAX) 10 MG tablet Take 1 tablet (10 mg total) by mouth 3 (three) times daily as needed. ?Patient taking differently: Take 10 mg by mouth 3 (three) times daily as needed for anxiety. 03/01/22   Ivy Lynn, NP  ?Insulin Glargine (BASAGLAR KWIKPEN) 100 UNIT/ML Inject 25 Units into the skin at bedtime. Add five units each time the fasting is over 125 three days in a row. 11/15/21   [name], MD  ?nitroGLYCERIN (NITROSTAT) 0.4 MG SL tablet Place 1  tablet (0.4 mg total) under the tongue every 5 (five) minutes as needed for chest pain. 11/09/21   Minus Breeding, MD  ?omeprazole (PRILOSEC) 20 MG capsule Take 1 capsule (20 mg total) by mouth daily. 12/12/21   [name], MD  ?ondansetron (ZOFRAN) 4 MG tablet TAKE 1 TABLET BY MOUTH EVERY 8 HOURS AS NEEDED FOR NAUSEA AND VOMITING 11/10/21   [name], MD  ?polyethylene glycol (MIRALAX / GLYCOLAX) 17 g packet Take 17 g by mouth daily as needed for moderate constipation or mild constipation. 03/10/22   Pokhrel, Corrie Mckusick, MD  ?QUEtiapine (SEROQUEL) 100 MG tablet Take 1 tablet (100 mg total) by mouth at bedtime. For sleep and for anxiety 12/12/21   [name], MD  ?rOPINIRole (REQUIP) 1 MG tablet Take 1 tablet (1 mg total) by mouth at bedtime. For leg cramps ?Patient not taking: Reported on 02/22/2022 09/15/21   [name], MD  ?rosuvastatin (CRESTOR) 5 MG tablet Take 1 tablet (5 mg total) by mouth daily. For cholesterol 12/12/21   [name], MD  ?Semaglutide,0.25 or 0.5MG /DOS, (OZEMPIC, 0.25 OR 0.5 MG/DOSE,) 2 MG/1.5ML SOPN Inject 0.5 mg into the skin every Friday.    [provider]  ?ticagrelor (BRILINTA) 90 MG TABS tablet Take 1 tablet (90 mg total) by mouth 2 (two) times daily. 03/10/22 06/08/22  Pokhrel, Corrie Mckusick, MD  ?torsemide (DEMADEX) 20 MG tablet Take 20 mg by mouth daily. 01/19/22   [provider]  ?   ? ?Allergies    ?Azithromycin, Ace inhibitors, Clopidogrel bisulfate, Codeine, Lisinopril, and Penicillins   ? ?Review of Systems   ?Review of Systems  ?Skin:  Positive for wound.  ?Neurological:  Negative for light-headedness.  ? ?Physical Exam ?Updated Vital Signs ?BP (!) 135/100   Pulse 76   Temp 97.8 ?F (36.6 ?C) (Oral)   Resp 20   Wt 84 kg   SpO2 100%   BMI 32.80 kg/m?  ?Physical Exam ?Vitals and nursing note reviewed.  ?Constitutional:   ?   Appearance: She is well-developed.  ?HENT:  ?   Head: Normocephalic and atraumatic.  ?Cardiovascular:  ?   Rate and Rhythm: Normal rate.   ?Pulmonary:  ?   Effort: Pulmonary effort is normal.  ?Musculoskeletal:  ?   Comments: Right upper AV fistula has 3 areas of mild bleeding.  2 are at the superior aspect, 1 medially and 1 in the midline.  Another in the midline more distal shows bleeding and this is the 1 that the patient states was squirting.  No signs of an infection.  Intact thrill  ?Skin: ?   General: Skin is warm and dry.  ?Neurological:  ?   Mental Status: She is alert.  ? ? ?ED Results / Procedures / Treatments   ?Labs ?(all labs ordered are listed, but only abnormal results are displayed) ?Labs Reviewed - No data to display ? ?  EKG ?None ? ?Radiology ?No results found. ? ?Procedures ?[name].Laceration Repair ? ?Date/Time: 03/16/2022 3:13 PM ?Performed by: Sherwood Gambler, MD ?Authorized by: Sherwood Gambler, MD  ? ?Consent:  ?  Consent obtained:  Verbal ?  Consent given by:  Patient ?  Risks, benefits, and alternatives were discussed: yes   ?Anesthesia:  ?  Anesthesia method:  Local infiltration ?  Local anesthetic:  Lidocaine 1% w/o epi ?Laceration details:  ?  Location:  Shoulder/arm ?  Shoulder/arm location:  R upper arm ?  Length (cm):  0.5 ?Pre-procedure details:  ?  Preparation:  Patient was prepped and draped in usual sterile fashion ?Skin repair:  ?  Repair method:  Sutures ?  Suture size:  4-0 ?  Wound skin closure material used: vicryl rapide. ?  Suture technique:  Figure eight ?  Number of sutures:  1 ?Approximation:  ?  Approximation:  Close ?[name].Laceration Repair ? ?Date/Time: 03/16/2022 3:24 PM ?Performed by: Sherwood Gambler, MD ?Authorized by: Sherwood Gambler, MD  ? ?Consent:  ?  Consent obtained:  Verbal ?  Consent given by:  Patient ?  Risks, benefits, and alternatives were discussed: yes   ?Anesthesia:  ?  Anesthesia method:  Local infiltration ?  Local anesthetic:  Lidocaine 1% w/o epi ?Laceration details:  ?  Location:  Shoulder/arm ?  Shoulder/arm location:  R upper arm ?  Length (cm):  0.5 ?Skin repair:  ?  Repair method:   Sutures ?  Suture size:  4-0 ?  Wound skin closure material used: Vicryl. ?  Suture technique:  Simple interrupted ?  Number of sutures:  1 ?Approximation:  ?  Approximation:  Close ?Repair type:  ?  Repair type:

## 2022-03-17 ENCOUNTER — Emergency Department (HOSPITAL_COMMUNITY)
Admission: EM | Admit: 2022-03-17 | Discharge: 2022-03-17 | Disposition: A | Discharge status: Home / Self Care | Payer: Medicare Other | Attending: Emergency Medicine | Admitting: Emergency Medicine

## 2022-03-17 ENCOUNTER — Encounter: Payer: Self-pay | Admitting: Family Medicine

## 2022-03-17 ENCOUNTER — Encounter (HOSPITAL_COMMUNITY): Payer: Self-pay | Admitting: *Deleted

## 2022-03-17 ENCOUNTER — Emergency Department (HOSPITAL_COMMUNITY): Payer: Medicare Other

## 2022-03-17 ENCOUNTER — Ambulatory Visit (INDEPENDENT_AMBULATORY_CARE_PROVIDER_SITE_OTHER): Payer: Medicare Other | Admitting: Family Medicine

## 2022-03-17 VITALS — BP 148/67 | HR 89 | Temp 97.9°F | Ht 63.0 in | Wt 187.6 lb

## 2022-03-17 DIAGNOSIS — Z992 Dependence on renal dialysis: Secondary | ICD-10-CM

## 2022-03-17 DIAGNOSIS — Z79899 Other long term (current) drug therapy: Secondary | ICD-10-CM | POA: Diagnosis not present

## 2022-03-17 DIAGNOSIS — R9431 Abnormal electrocardiogram [ECG] [EKG]: Secondary | ICD-10-CM | POA: Diagnosis not present

## 2022-03-17 DIAGNOSIS — I25110 Atherosclerotic heart disease of native coronary artery with unstable angina pectoris: Secondary | ICD-10-CM

## 2022-03-17 DIAGNOSIS — F411 Generalized anxiety disorder: Secondary | ICD-10-CM | POA: Diagnosis not present

## 2022-03-17 DIAGNOSIS — F419 Anxiety disorder, unspecified: Secondary | ICD-10-CM

## 2022-03-17 DIAGNOSIS — R0602 Shortness of breath: Secondary | ICD-10-CM | POA: Insufficient documentation

## 2022-03-17 DIAGNOSIS — N186 End stage renal disease: Secondary | ICD-10-CM | POA: Diagnosis not present

## 2022-03-17 DIAGNOSIS — I25119 Atherosclerotic heart disease of native coronary artery with unspecified angina pectoris: Secondary | ICD-10-CM | POA: Diagnosis not present

## 2022-03-17 DIAGNOSIS — J811 Chronic pulmonary edema: Secondary | ICD-10-CM | POA: Diagnosis not present

## 2022-03-17 DIAGNOSIS — E1121 Type 2 diabetes mellitus with diabetic nephropathy: Secondary | ICD-10-CM

## 2022-03-17 DIAGNOSIS — G4700 Insomnia, unspecified: Secondary | ICD-10-CM

## 2022-03-17 DIAGNOSIS — I4891 Unspecified atrial fibrillation: Secondary | ICD-10-CM

## 2022-03-17 DIAGNOSIS — R079 Chest pain, unspecified: Secondary | ICD-10-CM | POA: Diagnosis not present

## 2022-03-17 DIAGNOSIS — I517 Cardiomegaly: Secondary | ICD-10-CM | POA: Diagnosis not present

## 2022-03-17 MED ORDER — QUETIAPINE FUMARATE 200 MG PO TABS: 200.0000 mg | ORAL_TABLET | Freq: Every day | ORAL | 1 refills | Status: DC

## 2022-03-17 MED ORDER — HYDROXYZINE HCL 25 MG PO TABS: 25.0000 mg | ORAL_TABLET | Freq: Three times a day (TID) | ORAL | 2 refills | Status: DC | PRN

## 2022-03-17 MED ORDER — QUETIAPINE FUMARATE 100 MG PO TABS
200.0000 mg | ORAL_TABLET | Freq: Every day | ORAL | Status: DC
  Administered 2022-03-17: 200 mg via ORAL
  Filled 2022-03-17: qty 2

## 2022-03-17 NOTE — Progress Notes (Signed)
? ?Subjective:  ?Patient ID: [name], female    DOB: 13-Dec-1950  Age: 71 y.o. MRN: [id number] ? ?CC: Hospitalization Follow-up ? ? ?HPI ?[name] presents for hospital follow up. Admitted on April 1 & DC on April 12. Had cardiac cath & 2 stents placed.  She was discharged 2 days ago.  Yesterday she went to dialysis and at the end she bled so severely from the puncture site of her shot that she had to go to the emergency room to get it stopped.  Now she has bruising all up and down the right arm around the area of the shunt and the puncture.  She was told she could hold off on the Eliquis for 2 days because of that.  However she supposed to start back tomorrow.  She is quite distraught about the thought of bleeding again and ending up in the emergency department.  She does not want to take the blood thinners but at the same times realizes that it is critical to take the Eliquis due to her atrial fibrillation and the Brilinta due to her recently placed stents.  She is very anxious and not sleeping well and would like to have an increase in her Seroquel to help her with anxiety.  She could not tolerate the previously prescribed BuSpar because of nausea. ? ? ?Off buspar due to nausea.  ? ? ?A lot of anxiety as a result. Coulldn't sleep last night. Feels like something is cutting off her air.  ? ? ?  03/17/2022  ?  2:19 PM 03/17/2022  ? 10:41 AM 03/01/2022  ?  2:28 PM  ?Depression screen PHQ 2/9  ?Decreased Interest 0 0 0  ?Down, Depressed, Hopeless 1 0 0  ?PHQ - 2 Score 1 0 0  ?Altered sleeping 3  3  ?Tired, decreased energy 0  1  ?Change in appetite 1  0  ?Feeling bad or failure about yourself  0  0  ?Trouble concentrating 0  0  ?Moving slowly or fidgety/restless 0  0  ?Suicidal thoughts 0  0  ?PHQ-9 Score 5  4  ?Difficult doing work/chores Somewhat difficult  Not difficult at all  ? ? ?History ?Afreen has a past medical history of Acute on chronic diastolic CHF (congestive heart failure) ([place]) (03/05/2015),  Anemia, Anxiety, Arthritis, CAD (coronary artery disease), Chronic kidney disease, Chronic neck pain, COPD (chronic obstructive pulmonary disease) ([place]), Degenerative joint disease, Diabetic neuropathy ([place]), Diverticulosis, Esophageal stricture, GERD (gastroesophageal reflux disease), Hyperlipidemia, Hypertension, IDDM (insulin dependent diabetes mellitus), Pericardial effusion (03/06/2015), Peripheral vascular disease ([place]), Stroke ([place]), and Vitamin D deficiency.  ? ?She has a past surgical history that includes Bladder surgery; Partial hysterectomy; Shoulder surgery (Right); Cervical spine surgery (2012); Abdominal hysterectomy; Laminectomy (12/29/2011); cardiac stents; Lumbar laminectomy/decompression microdiscectomy (12/29/2011); Back surgery; Cystoscopy with injection (N/A, 06/03/2013); Cardiac catheterization (2003, 2005, 2009); Cataract extraction w/PHACO (Right, 05/04/2014); Cataract extraction w/PHACO (Left, 07/20/2014); AV fistula placement (Right, 07/20/2015); Cardiac catheterization (N/A, 11/22/2015); Insertion of dialysis catheter (N/A, 11/26/2015); AV fistula placement (Right, 11/26/2015); Fistulogram (Right, 10/20/2016); A/V Fistulagram (N/A, 08/10/2017); PERIPHERAL VASCULAR INTERVENTION (08/10/2017); A/V Fistulagram (N/A, 03/03/2020); PERIPHERAL VASCULAR BALLOON ANGIOPLASTY (03/03/2020); PERIPHERAL VASCULAR BALLOON ANGIOPLASTY (Right, 04/15/2021); LEFT HEART CATH AND CORONARY ANGIOGRAPHY (N/A, 03/06/2022); CORONARY ATHERECTOMY (N/A, 03/08/2022); INTRAVASCULAR IMAGING/OCT (N/A, 03/08/2022); and PERIPHERAL VASCULAR BALLOON ANGIOPLASTY (03/14/2022).  ? ?Her family history includes Cancer in her father; Diabetes in her daughter; Heart disease in an other family member; Hypertension in her daughter, daughter, father, and mother; Pancreatitis  in her brother.She reports that she quit smoking about 20 years ago. Her smoking use included cigarettes. She has a 30.00 pack-year smoking history. She has never used smokeless tobacco.  She reports that she does not drink alcohol and does not use drugs. ? ? ? ?ROS ?Review of Systems  ?Constitutional:  Positive for fatigue. Negative for activity change, appetite change and fever.  ?HENT: Negative.    ?Eyes:  Negative for visual disturbance.  ?Respiratory:  Negative for shortness of breath.   ?Cardiovascular:  Negative for chest pain.  ?Gastrointestinal:  Negative for abdominal pain.  ?Musculoskeletal:  Negative for arthralgias.  ?Psychiatric/Behavioral:  Positive for agitation, dysphoric mood and sleep disturbance. The patient is nervous/anxious.   ? ?Objective:  ?BP (!) 148/67   Pulse 89   Temp 97.9 ?F (36.6 ?C)   Ht _0  (1.6 m)   Wt 187 lb 9.6 oz (85.1 kg)   SpO2 99%   BMI 33.23 kg/m?  ? ?BP Readings from Last 3 Encounters:  ?03/17/22 (!) 148/67  ?03/16/22 (!) 146/61  ?03/15/22 126/67  ? ? ?Wt Readings from Last 3 Encounters:  ?03/17/22 187 lb 9.6 oz (85.1 kg)  ?03/16/22 185 lb 3 oz (84 kg)  ?03/14/22 178 lb 5.6 oz (80.9 kg)  ? ? ? ?Physical Exam ?Constitutional:   ?   General: She is not in acute distress. ?   Appearance: She is well-developed.  ?Cardiovascular:  ?   Rate and Rhythm: Normal rate and regular rhythm.  ?Pulmonary:  ?   Breath sounds: Normal breath sounds.  ?Musculoskeletal:     ?   General: Normal range of motion.  ?Skin: ?   General: Skin is warm and dry.  ?   Findings: Bruising (large area of hematoma at the ventral right upper arm to the elbow.  Her shunt is palpable just beneath this.  Of note is that she had to have a procedure to clean out the shunt by vascular during her hospitalization.  Stitches from that are noted) present.  ?Neurological:  ?   Mental Status: She is alert and oriented to person, place, and time.  ? ? ? ? ?Assessment & Plan:  ? ?[name] was seen today for hospitalization follow-up. ? ?Diagnoses and all orders for this visit: ? ?Atrial fibrillation with RVR ([place]) ? ?GAD (generalized anxiety disorder) ?-     CMP14+EGFR ?-     QUEtiapine (SEROQUEL) 200  MG tablet; Take 1 tablet (200 mg total) by mouth at bedtime. For sleep and for anxiety ?-     hydrOXYzine (ATARAX) 25 MG tablet; Take 1 tablet (25 mg total) by mouth 3 (three) times daily as needed. ?-     CBC with Differential/Platelet ? ?Type II diabetes mellitus with nephropathy ([place]) ?-     CMP14+EGFR ?-     CBC with Differential/Platelet ? ?Atherosclerosis of native coronary artery of native heart with angina pectoris ([place]) ?-     CMP14+EGFR ?-     CBC with Differential/Platelet ? ?ESRD (end stage renal disease) on dialysis [facility]) ?-     CMP14+EGFR ?-     CBC with Differential/Platelet ? ?Unstable angina pectoris due to coronary arteriosclerosis ([place]) ? ? ? ? ? ? ?I have changed [name]'s QUEtiapine and hydrOXYzine. I am also having her maintain her calcium acetate, acetaminophen, Carboxymethylcellul-Glycerin (LUBRICATING EYE DROPS OP), OneTouch Ultra, gabapentin, rOPINIRole, FreeStyle Libre 2 Sensor, Ozempic (0.25 or 0.5 MG/DOSE), nitroGLYCERIN, ondansetron, FreeStyle Libre 2 Reader, budesonide-formoterol, [employer], albuterol,  albuterol, omeprazole, rosuvastatin, cetirizine, torsemide, famotidine, ticagrelor, apixaban, diltiazem, docusate sodium, polyethylene glycol, and atenolol. ? ?Allergies as of 03/17/2022   ? ?   Reactions  ? Azithromycin   ? Prolonged QT  ? Ace Inhibitors Cough  ? Clopidogrel Bisulfate Other (See Comments)  ? Sick, Headache, "Felt terrible"  ? Codeine Other (See Comments)  ? hallucinations  ? Lisinopril Cough  ? Penicillins Other (See Comments)  ? Unknown reaction ?Did it involve swelling of the face/tongue/throat, SOB, or low BP? Unknown ?Did it involve sudden or severe rash/hives, skin peeling, or any reaction on the inside of your mouth or nose? Unknown ?Did you need to seek medical attention at a hospital or doctor's office? Unknown ?When did it last happen?      childhood allergy ?If all above answers are "NO", may proceed with cephalosporin use.  ? ?  ? ?   ?Medication List  ?  ? ?  ? Accurate as of March 17, 2022  4:35 PM. If you have any questions, ask your nurse or doctor.  ?  ?  ? ?  ? ?acetaminophen 500 MG tablet ?Commonly known as: TYLENOL ?Take 1,000 mg by mouth ev

## 2022-03-17 NOTE — ED Triage Notes (Signed)
Chest pain with shortness of breath. States she recently had heart surgery with 2 stents placed ?

## 2022-03-17 NOTE — Discharge Instructions (Addendum)
We are giving you the increased dose of Seroquel.  Make sure you start taking it tomorrow.  Use the hydroxyzine, 3 times a day as needed for anxiety.  Go to dialysis tomorrow then restart the Eliquis.  Return here, if needed for problems. ?

## 2022-03-17 NOTE — ED Provider Notes (Signed)
?[facility] ?Provider Note ? ? ?CSN: [id number] ?Arrival date & time: 03/17/22  1658 ? ?  ? ?History ? ?Chief Complaint  ?Patient presents with  ? Chest Pain  ? ? ?[name] is a 71 y.o. female. ? ?HPI ?She is here for evaluation of shortness of breath.  Shortness of breath is ongoing since she was hospitalized a couple weeks ago and had a cardiac catheterization with stenting.  She was in the ED yesterday with bleeding from her right arm fistula.  She required suture closure to prevent the bleeding.  She is currently on Eliquis, which was held yesterday after the bleeding.  Plans are to restart the Eliquis tomorrow after dialysis.  Patient denies chest pain, focal weakness or blurred vision.  Today she saw her PCP and he increased her doses of hydroxyzine and Seroquel.  She was able to get the increased dose of hydroxyzine but the pharmacy did not have the Seroquel dose.  They will have it tomorrow. ?  ? ?Home Medications ?Prior to Admission medications   ?Medication Sig Start Date End Date Taking? Authorizing Provider  ?acetaminophen (TYLENOL) 500 MG tablet Take 1,000 mg by mouth every 6 (six) hours as needed for moderate pain or headache.   Yes [provider]  ?albuterol (PROVENTIL) (2.5 MG/3ML) 0.083% nebulizer solution Take 3 mLs (2.5 mg total) by nebulization every 6 (six) hours as needed for wheezing or shortness of breath. 12/02/21  Yes [name], MD  ?albuterol (VENTOLIN HFA) 108 (90 Base) MCG/ACT inhaler Inhale 2 puffs into the lungs every 6 (six) hours as needed for wheezing or shortness of breath. 12/02/21  Yes [name], MD  ?apixaban (ELIQUIS) 5 MG TABS tablet Take 1 tablet (5 mg total) by mouth 2 (two) times daily. 03/10/22  Yes Pokhrel, Laxman, MD  ?atenolol (TENORMIN) 50 MG tablet Take 1 tablet (50 mg total) by mouth 2 (two) times daily. Skip on the morning of hemodialysis 03/10/22  Yes Pokhrel, Laxman, MD  ?budesonide-formoterol (SYMBICORT) 160-4.5 MCG/ACT  inhaler Inhale 2 puffs into the lungs 2 (two) times daily. 11/15/21  Yes [name], MD  ?calcium acetate (PHOSLO) 667 MG capsule Take 667-1,334 mg by mouth See admin instructions. Take 1334 mg with meals 3 times daily and 667 mg with snacks   Yes [provider]  ?Carboxymethylcellul-Glycerin (LUBRICATING EYE DROPS OP) Place 1 drop into both eyes daily as needed (dry eyes).   Yes [provider]  ?cetirizine (ZYRTEC) 10 MG tablet Take 1 tablet (10 mg total) by mouth daily as needed for allergies. 02/08/22  Yes [name], MD  ?diltiazem (CARDIZEM CD) 180 MG 24 hr capsule Take 1 capsule (180 mg total) by mouth every evening. 03/10/22 04/09/22 Yes Pokhrel, Laxman, MD  ?docusate sodium (COLACE) 100 MG capsule Take 1 capsule (100 mg total) by mouth 2 (two) times daily for 10 days. 03/10/22 03/20/22 Yes Pokhrel, Laxman, MD  ?famotidine (PEPCID) 20 MG tablet Take 1 tablet (20 mg total) by mouth 2 (two) times daily. ?Patient taking differently: Take 20 mg by mouth at bedtime. 02/15/22  Yes Dettinger, Fransisca Kaufmann, MD  ?gabapentin (NEURONTIN) 400 MG capsule Take 1 capsule (400 mg total) by mouth See admin instructions. Take it after HD ?Patient taking differently: Take 400 mg by mouth See admin instructions. Take it after HD take 1 capsule after dialysis on dialysis days 02/21/21  Yes Barton Dubois, MD  ?hydrOXYzine (ATARAX) 25 MG tablet Take 1 tablet (25 mg total) by mouth  3 (three) times daily as needed. 03/17/22  Yes [name], MD  ?Insulin Glargine (BASAGLAR KWIKPEN) 100 UNIT/ML Inject 25 Units into the skin at bedtime. Add five units each time the fasting is over 125 three days in a row. 11/15/21  Yes [name], MD  ?nitroGLYCERIN (NITROSTAT) 0.4 MG SL tablet Place 1 tablet (0.4 mg total) under the tongue every 5 (five) minutes as needed for chest pain. 11/09/21  Yes Minus Breeding, MD  ?omeprazole (PRILOSEC) 20 MG capsule Take 1 capsule (20 mg total) by mouth daily. 12/12/21  Yes [name], [name], MD   ?ondansetron (ZOFRAN) 4 MG tablet TAKE 1 TABLET BY MOUTH EVERY 8 HOURS AS NEEDED FOR NAUSEA AND VOMITING 11/10/21  Yes [name], MD  ?polyethylene glycol (MIRALAX / GLYCOLAX) 17 g packet Take 17 g by mouth daily as needed for moderate constipation or mild constipation. 03/10/22  Yes Pokhrel, Laxman, MD  ?QUEtiapine (SEROQUEL) 200 MG tablet Take 1 tablet (200 mg total) by mouth at bedtime. For sleep and for anxiety 03/17/22  Yes [name], [name], MD  ?rOPINIRole (REQUIP) 1 MG tablet Take 1 tablet (1 mg total) by mouth at bedtime. For leg cramps 09/15/21  Yes [name], MD  ?rosuvastatin (CRESTOR) 5 MG tablet Take 1 tablet (5 mg total) by mouth daily. For cholesterol 12/12/21  Yes [name], MD  ?Semaglutide,0.25 or 0.5MG /DOS, (OZEMPIC, 0.25 OR 0.5 MG/DOSE,) 2 MG/1.5ML SOPN Inject 0.5 mg into the skin every Friday.   Yes [provider]  ?ticagrelor (BRILINTA) 90 MG TABS tablet Take 1 tablet (90 mg total) by mouth 2 (two) times daily. 03/10/22 06/08/22 Yes Pokhrel, Laxman, MD  ?torsemide (DEMADEX) 20 MG tablet Take 20 mg by mouth daily. 01/19/22  Yes [provider]  ?Continuous Blood Gluc Receiver (FREESTYLE LIBRE 2 READER) DEVI USE TO TEST BLOOD SUGAR CONTINUOUSLY 11/15/21   [name], MD  ?Continuous Blood Gluc Sensor (FREESTYLE LIBRE 2 SENSOR) MISC Use multiple times daily to track glucose to prevent highs and lows as a complication of dialysis Dx:Z99.2, E11.59 09/15/21   [name], MD  ?glucose blood (ONETOUCH ULTRA) test strip Use to test blood sugar 3 times daily as directed. DX: E11.9 09/13/20   [name], MD  ?   ? ?Allergies    ?Azithromycin, Ace inhibitors, Clopidogrel bisulfate, Codeine, Lisinopril, and Penicillins   ? ?Review of Systems   ?Review of Systems ? ?Physical Exam ?Updated Vital Signs ?BP (!) 166/67   Pulse 81   Temp 98 ?F (36.7 ?C) (Oral)   Resp 18   Ht 5\' 3"  (1.6 m)   Wt 83.9 kg   SpO2 98%   BMI 32.77 kg/m?  ?Physical Exam ?Vitals and nursing note  reviewed.  ?Constitutional:   ?   General: She is not in acute distress. ?   Appearance: She is well-developed. She is obese. She is not ill-appearing, toxic-appearing or diaphoretic.  ?HENT:  ?   Head: Normocephalic and atraumatic.  ?   Right Ear: External ear normal.  ?   Left Ear: External ear normal.  ?Eyes:  ?   Conjunctiva/sclera: Conjunctivae normal.  ?   Pupils: Pupils are equal, round, and reactive to light.  ?Neck:  ?   Trachea: Phonation normal.  ?Cardiovascular:  ?   Rate and Rhythm: Normal rate and regular rhythm.  ?   Heart sounds: Normal heart sounds.  ?   Comments: Right arm fistula, upper, with normal pulsation.  Skin is dry, without bleeding.  There are  3 sutures applied in various places, which appear intact there is no dehiscence of wounds. ?Pulmonary:  ?   Effort: Pulmonary effort is normal. No respiratory distress.  ?   Breath sounds: Normal breath sounds. No stridor. No wheezing.  ?Abdominal:  ?   General: There is no distension.  ?   Palpations: Abdomen is soft.  ?   Tenderness: There is no abdominal tenderness.  ?Musculoskeletal:     ?   General: Normal range of motion.  ?   Cervical back: Normal range of motion and neck supple.  ?Skin: ?   General: Skin is warm and dry.  ?Neurological:  ?   Mental Status: She is alert and oriented to person, place, and time.  ?   Cranial Nerves: No cranial nerve deficit.  ?   Sensory: No sensory deficit.  ?   Motor: No abnormal muscle tone.  ?   Coordination: Coordination normal.  ?Psychiatric:     ?   Behavior: Behavior normal.     ?   Thought Content: Thought content normal.     ?   Judgment: Judgment normal.  ?   Comments: Patient is anxious, when distracted she gets calmer.  ? ? ?ED Results / Procedures / Treatments   ?Labs ?(all labs ordered are listed, but only abnormal results are displayed) ?Labs Reviewed - No data to display ? ? ?EKG ?EKG Interpretation ? ?Date/Time:  Friday March 17 2022 17:08:44 EDT ?Ventricular Rate:  81 ?PR Interval:    ?QRS  Duration: 100 ?QT Interval:  410 ?QTC Calculation: 476 ?R Axis:   -79 ?Text Interpretation: Atrial fibrillation Left axis deviation Minimal voltage criteria for LVH, may be normal variant ( Cornell product ) Se

## 2022-03-17 NOTE — ED Notes (Signed)
Dr verbalize to hold off on blood work at this time. ?

## 2022-03-18 DIAGNOSIS — N250 Renal osteodystrophy: Secondary | ICD-10-CM | POA: Diagnosis not present

## 2022-03-18 DIAGNOSIS — N2581 Secondary hyperparathyroidism of renal origin: Secondary | ICD-10-CM | POA: Diagnosis not present

## 2022-03-18 DIAGNOSIS — Z23 Encounter for immunization: Secondary | ICD-10-CM | POA: Diagnosis not present

## 2022-03-18 DIAGNOSIS — D509 Iron deficiency anemia, unspecified: Secondary | ICD-10-CM | POA: Diagnosis not present

## 2022-03-18 DIAGNOSIS — Z992 Dependence on renal dialysis: Secondary | ICD-10-CM | POA: Diagnosis not present

## 2022-03-18 DIAGNOSIS — N186 End stage renal disease: Secondary | ICD-10-CM | POA: Diagnosis not present

## 2022-03-19 DIAGNOSIS — F419 Anxiety disorder, unspecified: Secondary | ICD-10-CM | POA: Diagnosis not present

## 2022-03-19 DIAGNOSIS — J449 Chronic obstructive pulmonary disease, unspecified: Secondary | ICD-10-CM | POA: Diagnosis not present

## 2022-03-19 DIAGNOSIS — N186 End stage renal disease: Secondary | ICD-10-CM | POA: Diagnosis not present

## 2022-03-19 DIAGNOSIS — K219 Gastro-esophageal reflux disease without esophagitis: Secondary | ICD-10-CM | POA: Diagnosis not present

## 2022-03-19 DIAGNOSIS — Z794 Long term (current) use of insulin: Secondary | ICD-10-CM | POA: Diagnosis not present

## 2022-03-19 DIAGNOSIS — E785 Hyperlipidemia, unspecified: Secondary | ICD-10-CM | POA: Diagnosis not present

## 2022-03-19 DIAGNOSIS — G4733 Obstructive sleep apnea (adult) (pediatric): Secondary | ICD-10-CM | POA: Diagnosis not present

## 2022-03-19 DIAGNOSIS — E1159 Type 2 diabetes mellitus with other circulatory complications: Secondary | ICD-10-CM | POA: Diagnosis not present

## 2022-03-19 DIAGNOSIS — E1151 Type 2 diabetes mellitus with diabetic peripheral angiopathy without gangrene: Secondary | ICD-10-CM | POA: Diagnosis not present

## 2022-03-19 DIAGNOSIS — Z48812 Encounter for surgical aftercare following surgery on the circulatory system: Secondary | ICD-10-CM | POA: Diagnosis not present

## 2022-03-19 DIAGNOSIS — E1169 Type 2 diabetes mellitus with other specified complication: Secondary | ICD-10-CM | POA: Diagnosis not present

## 2022-03-19 DIAGNOSIS — K222 Esophageal obstruction: Secondary | ICD-10-CM | POA: Diagnosis not present

## 2022-03-19 DIAGNOSIS — E1140 Type 2 diabetes mellitus with diabetic neuropathy, unspecified: Secondary | ICD-10-CM | POA: Diagnosis not present

## 2022-03-19 DIAGNOSIS — I2510 Atherosclerotic heart disease of native coronary artery without angina pectoris: Secondary | ICD-10-CM | POA: Diagnosis not present

## 2022-03-19 DIAGNOSIS — E559 Vitamin D deficiency, unspecified: Secondary | ICD-10-CM | POA: Diagnosis not present

## 2022-03-19 DIAGNOSIS — I152 Hypertension secondary to endocrine disorders: Secondary | ICD-10-CM | POA: Diagnosis not present

## 2022-03-19 DIAGNOSIS — M19012 Primary osteoarthritis, left shoulder: Secondary | ICD-10-CM | POA: Diagnosis not present

## 2022-03-19 DIAGNOSIS — D631 Anemia in chronic kidney disease: Secondary | ICD-10-CM | POA: Diagnosis not present

## 2022-03-19 DIAGNOSIS — E871 Hypo-osmolality and hyponatremia: Secondary | ICD-10-CM | POA: Diagnosis not present

## 2022-03-19 DIAGNOSIS — Z955 Presence of coronary angioplasty implant and graft: Secondary | ICD-10-CM | POA: Diagnosis not present

## 2022-03-19 DIAGNOSIS — E876 Hypokalemia: Secondary | ICD-10-CM | POA: Diagnosis not present

## 2022-03-19 DIAGNOSIS — T82838D Hemorrhage of vascular prosthetic devices, implants and grafts, subsequent encounter: Secondary | ICD-10-CM | POA: Diagnosis not present

## 2022-03-19 DIAGNOSIS — E1122 Type 2 diabetes mellitus with diabetic chronic kidney disease: Secondary | ICD-10-CM | POA: Diagnosis not present

## 2022-03-19 DIAGNOSIS — I4891 Unspecified atrial fibrillation: Secondary | ICD-10-CM | POA: Diagnosis not present

## 2022-03-19 DIAGNOSIS — I5033 Acute on chronic diastolic (congestive) heart failure: Secondary | ICD-10-CM | POA: Diagnosis not present

## 2022-03-20 ENCOUNTER — Telehealth: Payer: Self-pay | Admitting: Family Medicine

## 2022-03-20 LAB — CMP14+EGFR
ALT: 16 IU/L (ref 0–32)
AST: 13 IU/L (ref 0–40)
Albumin/Globulin Ratio: 2.1 (ref 1.2–2.2)
Albumin: 4 g/dL (ref 3.8–4.8)
Alkaline Phosphatase: 72 IU/L (ref 44–121)
BUN/Creatinine Ratio: 3 — ABNORMAL LOW (ref 12–28)
BUN: 21 mg/dL (ref 8–27)
Bilirubin Total: 0.3 mg/dL (ref 0.0–1.2)
CO2: 27 mmol/L (ref 20–29)
Calcium: 8.7 mg/dL (ref 8.7–10.3)
Chloride: 93 mmol/L — ABNORMAL LOW (ref 96–106)
Creatinine, Ser: 6.52 mg/dL (ref 0.57–1.00)
Globulin, Total: 1.9 g/dL (ref 1.5–4.5)
Glucose: 174 mg/dL — ABNORMAL HIGH (ref 70–99)
Potassium: 3.8 mmol/L (ref 3.5–5.2)
Sodium: 137 mmol/L (ref 134–144)
Total Protein: 5.9 g/dL — ABNORMAL LOW (ref 6.0–8.5)
eGFR: 6 mL/min/{1.73_m2} — ABNORMAL LOW (ref >59)

## 2022-03-20 LAB — CBC WITH DIFFERENTIAL/PLATELET
Basophils Absolute: 0.1 10*3/uL (ref 0.0–0.2)
Basos: 1 %
EOS (ABSOLUTE): 0.5 10*3/uL — ABNORMAL HIGH (ref 0.0–0.4)
Eos: 4 %
Hematocrit: 27.8 % — ABNORMAL LOW (ref 34.0–46.6)
Hemoglobin: 8.9 g/dL — CL (ref 11.1–15.9)
Immature Grans (Abs): 0 10*3/uL (ref 0.0–0.1)
Immature Granulocytes: 0 %
Lymphocytes Absolute: 1.8 10*3/uL (ref 0.7–3.1)
Lymphs: 17 %
MCH: 28.1 pg (ref 26.6–33.0)
MCHC: 32 g/dL (ref 31.5–35.7)
MCV: 88 fL (ref 79–97)
Monocytes Absolute: 1 10*3/uL — ABNORMAL HIGH (ref 0.1–0.9)
Monocytes: 9 %
Neutrophils Absolute: 7.2 10*3/uL — ABNORMAL HIGH (ref 1.4–7.0)
Neutrophils: 69 %
Platelets: 232 10*3/uL (ref 150–450)
RBC: 3.17 x10E6/uL — ABNORMAL LOW (ref 3.77–5.28)
RDW: 16.1 % — ABNORMAL HIGH (ref 11.7–15.4)
WBC: 10.5 10*3/uL (ref 3.4–10.8)

## 2022-03-20 NOTE — Progress Notes (Signed)
Hello [name], ? ?Your lab result is normal and/or stable.Some minor variations that are not significant are commonly marked abnormal, but do not represent any medical problem for you. ? ?Best regards, ?[name], M.D.

## 2022-03-20 NOTE — Telephone Encounter (Signed)
Herbert Deaner with [facility] called to update PCP about patient. ? ?Says that he had a visit with patient for an evaluation and reports level 2 med interaction with Brilinta and Eliquis. No action needed. ? ?Reports irregular heart rate within 80-90s while resting. Was told by patient that her normal heart rate while resting is usually within 50-60s. ? ?Patient reports having intermittent severe pain with both legs after moving but says pain goes away 1-2 minutes after resting legs. Similar to intermittent Claudication. ? ?Reports patient has had multiple ER visits since receiving her referral. ? ?Reports that patient would benefit from [facility]. ? ?O2 evaluation pending. ? ?Reports needing Home Health PT 1x per week for 8 weeks. ? ?Can contact Jim at [phone] if needed. ? ? ?

## 2022-03-20 NOTE — Telephone Encounter (Signed)
Cardiology is aware of, in fact mandated the medication combination. She was dxed with atrial fib, so the irregular heart rate is expected. The leg pain can be worked up in the office. Not emergent sounding. ?

## 2022-03-20 NOTE — Telephone Encounter (Signed)
Pt returned missed call. Scheduled pt to see Dr Livia Snellen for leg pain on 03/22/22. ?

## 2022-03-20 NOTE — Telephone Encounter (Signed)
Called patient to offer appointment for leg pain ? ?No answer, left message to return call ?

## 2022-03-21 DIAGNOSIS — N186 End stage renal disease: Secondary | ICD-10-CM | POA: Diagnosis not present

## 2022-03-21 DIAGNOSIS — N250 Renal osteodystrophy: Secondary | ICD-10-CM | POA: Diagnosis not present

## 2022-03-21 DIAGNOSIS — Z23 Encounter for immunization: Secondary | ICD-10-CM | POA: Diagnosis not present

## 2022-03-21 DIAGNOSIS — N2581 Secondary hyperparathyroidism of renal origin: Secondary | ICD-10-CM | POA: Diagnosis not present

## 2022-03-21 DIAGNOSIS — D509 Iron deficiency anemia, unspecified: Secondary | ICD-10-CM | POA: Diagnosis not present

## 2022-03-21 DIAGNOSIS — Z992 Dependence on renal dialysis: Secondary | ICD-10-CM | POA: Diagnosis not present

## 2022-03-22 ENCOUNTER — Encounter: Payer: Self-pay | Admitting: Family Medicine

## 2022-03-22 ENCOUNTER — Ambulatory Visit (INDEPENDENT_AMBULATORY_CARE_PROVIDER_SITE_OTHER): Payer: Medicare Other | Admitting: Family Medicine

## 2022-03-22 VITALS — BP 124/59 | HR 74 | Temp 97.7°F | Ht 63.0 in | Wt 185.4 lb

## 2022-03-22 DIAGNOSIS — E1159 Type 2 diabetes mellitus with other circulatory complications: Secondary | ICD-10-CM | POA: Diagnosis not present

## 2022-03-22 DIAGNOSIS — I4891 Unspecified atrial fibrillation: Secondary | ICD-10-CM | POA: Diagnosis not present

## 2022-03-22 DIAGNOSIS — I70219 Atherosclerosis of native arteries of extremities with intermittent claudication, unspecified extremity: Secondary | ICD-10-CM | POA: Diagnosis not present

## 2022-03-22 DIAGNOSIS — F411 Generalized anxiety disorder: Secondary | ICD-10-CM

## 2022-03-22 DIAGNOSIS — N186 End stage renal disease: Secondary | ICD-10-CM

## 2022-03-22 DIAGNOSIS — I152 Hypertension secondary to endocrine disorders: Secondary | ICD-10-CM | POA: Diagnosis not present

## 2022-03-22 DIAGNOSIS — E1122 Type 2 diabetes mellitus with diabetic chronic kidney disease: Secondary | ICD-10-CM | POA: Diagnosis not present

## 2022-03-22 DIAGNOSIS — I5033 Acute on chronic diastolic (congestive) heart failure: Secondary | ICD-10-CM | POA: Diagnosis not present

## 2022-03-22 DIAGNOSIS — Z48812 Encounter for surgical aftercare following surgery on the circulatory system: Secondary | ICD-10-CM | POA: Diagnosis not present

## 2022-03-22 DIAGNOSIS — Z992 Dependence on renal dialysis: Secondary | ICD-10-CM | POA: Diagnosis not present

## 2022-03-22 MED ORDER — GABAPENTIN 400 MG PO CAPS: 800.0000 mg | ORAL_CAPSULE | ORAL | 1 refills | Status: DC

## 2022-03-22 MED ORDER — HYDROXYZINE HCL 50 MG PO TABS: 50.0000 mg | ORAL_TABLET | Freq: Three times a day (TID) | ORAL | 2 refills | Status: DC | PRN

## 2022-03-22 NOTE — Patient Instructions (Signed)
Your provider wants you to schedule an appointment with a Psychologist/Psychiatrist. The following list of offices requires the patient to call and make their own appointment, as there is information they need that only you can provide. Please feel free to choose form the following providers:  [facility]   [phone] Crisis Recovery in [place] [phone]  [facility]  [phone]   [address] [place], [place]  (Scheduled through [facility]) Must call and do an interview for appointment. Sees Children / Accepts Medicaid  [facility]    [phone]  [address], [address]    [place], [place]       [facility]  [phone] [address] [place], [place]  Evaluates for Autism but does not treat it Sees Children / Accepts Medicaid  [facility]    [phone] [address], [address]   [place], [place] Medication management, substance abuse, bipolar, grief, family, marriage, OCD, anxiety, PTSD Sees children / Accepts Medicaid  [facility]    [phone] [address] [place], [place] Sees children / Accepts Medicaid  [facility]  [phone] [address] [place], [place]   Dr Akinlayo     [phone] [address] [place], [place]  Sees ADD & ADHD for treatment Accepts Medicaid  [facility]  [phone] [address] [place], [place] Evaluates for Autism Accepts Medicaid  [facility]  [phone] [address] , [place]  Does Adult ADD evaluations Does not accept Medicaid  [facility]   [phone] [address]   ,  Uses animal therapy  Sees children as young as 3 years old Accepts Medicaid  [facility]     [phone]    [address]  [place],  [address] Sees children Accepts Medicaid  

## 2022-03-22 NOTE — Progress Notes (Signed)
? ?Subjective:  ?Patient ID: [name], female    DOB: 07-Aug-1951  Age: 70 y.o. MRN: [id number] ? ?CC: Leg Pain (BILATERAL) and Anxiety ? ? ?HPI ?[name] presents for pain in legs. Throbs & burns. Burns when she walks around. Stops with rest. Gabapentin not helping neuropathy. Bruising a lot, bleeding a lot with dialysis due to the combination of Brilinta and Eliquis.  ? ?Getting so anxious that she feels panic. Made worse by all of the recent events.  ? ? ?  03/22/2022  ?  4:06 PM 03/17/2022  ?  2:19 PM 03/17/2022  ? 10:41 AM  ?Depression screen PHQ 2/9  ?Decreased Interest 1 0 0  ?Down, Depressed, Hopeless 0 1 0  ?PHQ - 2 Score 1 1 0  ?Altered sleeping 3 3   ?Tired, decreased energy 1 0   ?Change in appetite 0 1   ?Feeling bad or failure about yourself  0 0   ?Trouble concentrating 0 0   ?Moving slowly or fidgety/restless 0 0   ?Suicidal thoughts 0 0   ?PHQ-9 Score 5 5   ?Difficult doing work/chores Somewhat difficult Somewhat difficult   ? ? ?History ?[name] has a past medical history of Acute on chronic diastolic CHF (congestive heart failure) ([facility]) (03/05/2015), Anemia, Anxiety, Arthritis, CAD (coronary artery disease), Chronic kidney disease, Chronic neck pain, COPD (chronic obstructive pulmonary disease) ([facility]), Degenerative joint disease, Diabetic neuropathy ([place]), Diverticulosis, Esophageal stricture, GERD (gastroesophageal reflux disease), Hyperlipidemia, Hypertension, IDDM (insulin dependent diabetes mellitus), Pericardial effusion (03/06/2015), Peripheral vascular disease ([place]), Stroke ([place]), and Vitamin D deficiency.  ? ?She has a past surgical history that includes Bladder surgery; Partial hysterectomy; Shoulder surgery (Right); Cervical spine surgery (2012); Abdominal hysterectomy; Laminectomy (12/29/2011); cardiac stents; Lumbar laminectomy/decompression microdiscectomy (12/29/2011); Back surgery; Cystoscopy with injection (N/A, 06/03/2013); Cardiac catheterization (2003, 2005, 2009); Cataract  extraction w/PHACO (Right, 05/04/2014); Cataract extraction w/PHACO (Left, 07/20/2014); AV fistula placement (Right, 07/20/2015); Cardiac catheterization (N/A, 11/22/2015); Insertion of dialysis catheter (N/A, 11/26/2015); AV fistula placement (Right, 11/26/2015); Fistulogram (Right, 10/20/2016); A/V Fistulagram (N/A, 08/10/2017); PERIPHERAL VASCULAR INTERVENTION (08/10/2017); A/V Fistulagram (N/A, 03/03/2020); PERIPHERAL VASCULAR BALLOON ANGIOPLASTY (03/03/2020); PERIPHERAL VASCULAR BALLOON ANGIOPLASTY (Right, 04/15/2021); LEFT HEART CATH AND CORONARY ANGIOGRAPHY (N/A, 03/06/2022); CORONARY ATHERECTOMY (N/A, 03/08/2022); INTRAVASCULAR IMAGING/OCT (N/A, 03/08/2022); and PERIPHERAL VASCULAR BALLOON ANGIOPLASTY (03/14/2022).  ? ?Her family history includes Cancer in her father; Diabetes in her daughter; Heart disease in an other family member; Hypertension in her daughter, daughter, father, and mother; Pancreatitis in her brother.She reports that she quit smoking about 20 years ago. Her smoking use included cigarettes. She has a 30.00 pack-year smoking history. She has never used smokeless tobacco. She reports that she does not drink alcohol and does not use drugs. ? ? ? ?ROS ?Review of Systems  ?Constitutional: Negative.   ?HENT: Negative.    ?Eyes:  Negative for visual disturbance.  ?Respiratory:  Negative for shortness of breath.   ?Cardiovascular:  Negative for chest pain.  ?Gastrointestinal:  Negative for abdominal pain.  ?Musculoskeletal:  Negative for arthralgias.  ? ?Objective:  ?BP (!) 124/59   Pulse 74   Temp 97.7 ?F (36.5 ?C)   Ht 5\' 3"  (1.6 m)   Wt 185 lb 6.4 oz (84.1 kg)   SpO2 95%   BMI 32.84 kg/m?  ? ?BP Readings from Last 3 Encounters:  ?03/22/22 (!) 124/59  ?03/17/22 (!) 166/67  ?03/17/22 (!) 148/67  ? ? ?Wt Readings from Last 3 Encounters:  ?03/22/22 185 lb 6.4 oz (84.1 kg)  ?  03/17/22 185 lb (83.9 kg)  ?03/17/22 187 lb 9.6 oz (85.1 kg)  ? ? ? ?Physical Exam ?Constitutional:   ?   General: She is not in acute  distress. ?   Appearance: She is well-developed.  ?Cardiovascular:  ?   Rate and Rhythm: Normal rate and regular rhythm.  ?Pulmonary:  ?   Breath sounds: Normal breath sounds.  ?Musculoskeletal:     ?   General: Normal range of motion.  ?Skin: ?   General: Skin is warm and dry.  ?Neurological:  ?   Mental Status: She is alert and oriented to person, place, and time.  ? ? ? ? ?Assessment & Plan:  ? ?[name] was seen today for leg pain and anxiety. ? ?Diagnoses and all orders for this visit: ? ?Extremity atherosclerosis with intermittent claudication ([place]) ?-     Ambulatory referral to Vascular Surgery ? ?GAD (generalized anxiety disorder) ?-     hydrOXYzine (ATARAX) 50 MG tablet; Take 1 tablet (50 mg total) by mouth 3 (three) times daily as needed for anxiety. ? ?Atrial fibrillation with RVR (HCC) ?-     TSH + free T4 ? ?End stage renal disease on dialysis due to type 2 diabetes mellitus ([place]) ? ?Other orders ?-     gabapentin (NEURONTIN) 400 MG capsule; Take 2 capsules (800 mg total) by mouth See admin instructions. Take it after HD ? ? ? ? ? ? ?I have changed [name]'s gabapentin and hydrOXYzine. I am also having her maintain her calcium acetate, acetaminophen, Carboxymethylcellul-Glycerin (LUBRICATING EYE DROPS OP), OneTouch Ultra, rOPINIRole, FreeStyle Libre 2 Sensor, Ozempic (0.25 or 0.5 MG/DOSE), nitroGLYCERIN, ondansetron, FreeStyle Libre 2 Reader, budesonide-formoterol, Basaglar KwikPen, albuterol, albuterol, omeprazole, rosuvastatin, cetirizine, torsemide, famotidine, ticagrelor, apixaban, diltiazem, polyethylene glycol, atenolol, and QUEtiapine. ? ?Allergies as of 03/22/2022   ? ?   Reactions  ? Azithromycin   ? Prolonged QT  ? Ace Inhibitors Cough  ? Clopidogrel Bisulfate Other (See Comments)  ? Sick, Headache, "Felt terrible"  ? Codeine Other (See Comments)  ? hallucinations  ? Lisinopril Cough  ? Penicillins Other (See Comments)  ? Unknown reaction ?Did it involve swelling of the  face/tongue/throat, SOB, or low BP? Unknown ?Did it involve sudden or severe rash/hives, skin peeling, or any reaction on the inside of your mouth or nose? Unknown ?Did you need to seek medical attention at a hospital or doctor's office? Unknown ?When did it last happen?      childhood allergy ?If all above answers are "NO", may proceed with cephalosporin use.  ? ?  ? ?  ?Medication List  ?  ? ?  ? Accurate as of March 22, 2022  5:52 PM. If you have any questions, ask your nurse or doctor.  ?  ?  ? ?  ? ?acetaminophen 500 MG tablet ?Commonly known as: TYLENOL ?Take 1,000 mg by mouth every 6 (six) hours as needed for moderate pain or headache. ?  ?albuterol 108 (90 Base) MCG/ACT inhaler ?Commonly known as: VENTOLIN HFA ?Inhale 2 puffs into the lungs every 6 (six) hours as needed for wheezing or shortness of breath. ?  ?albuterol (2.5 MG/3ML) 0.083% nebulizer solution ?Commonly known as: PROVENTIL ?Take 3 mLs (2.5 mg total) by nebulization every 6 (six) hours as needed for wheezing or shortness of breath. ?  ?apixaban 5 MG Tabs tablet ?Commonly known as: ELIQUIS ?Take 1 tablet (5 mg total) by mouth 2 (two) times daily. ?  ?atenolol 50 MG tablet ?Commonly known  as: TENORMIN ?Take 1 tablet (50 mg total) by mouth 2 (two) times daily. Skip on the morning of hemodialysis ?  ?Basaglar KwikPen 100 UNIT/ML ?Inject 25 Units into the skin at bedtime. Add five units each time the fasting is over 125 three days in a row. ?  ?budesonide-formoterol 160-4.5 MCG/ACT inhaler ?Commonly known as: SYMBICORT ?Inhale 2 puffs into the lungs 2 (two) times daily. ?  ?calcium acetate 667 MG capsule ?Commonly known as: PHOSLO ?Take 667-1,334 mg by mouth See admin instructions. Take 1334 mg with meals 3 times daily and 667 mg with snacks ?  ?cetirizine 10 MG tablet ?Commonly known as: ZYRTEC ?Take 1 tablet (10 mg total) by mouth daily as needed for allergies. ?  ?diltiazem 180 MG 24 hr capsule ?Commonly known as: CARDIZEM CD ?Take 1 capsule (180  mg total) by mouth every evening. ?  ?famotidine 20 MG tablet ?Commonly known as: PEPCID ?Take 1 tablet (20 mg total) by mouth 2 (two) times daily. ?What changed: when to take this ?  ?FreeStyle Libre 2 Reader Kerrin Mo ?USE TO TEST BL

## 2022-03-23 ENCOUNTER — Telehealth: Payer: Self-pay

## 2022-03-23 DIAGNOSIS — D509 Iron deficiency anemia, unspecified: Secondary | ICD-10-CM | POA: Diagnosis not present

## 2022-03-23 DIAGNOSIS — N250 Renal osteodystrophy: Secondary | ICD-10-CM | POA: Diagnosis not present

## 2022-03-23 DIAGNOSIS — Z992 Dependence on renal dialysis: Secondary | ICD-10-CM | POA: Diagnosis not present

## 2022-03-23 DIAGNOSIS — N2581 Secondary hyperparathyroidism of renal origin: Secondary | ICD-10-CM | POA: Diagnosis not present

## 2022-03-23 DIAGNOSIS — Z23 Encounter for immunization: Secondary | ICD-10-CM | POA: Diagnosis not present

## 2022-03-23 DIAGNOSIS — N186 End stage renal disease: Secondary | ICD-10-CM | POA: Diagnosis not present

## 2022-03-23 LAB — TSH+FREE T4
Free T4: 1.11 ng/dL (ref 0.82–1.77)
TSH: 3.29 u[IU]/mL (ref 0.450–4.500)

## 2022-03-23 NOTE — Progress Notes (Signed)
Sent message to Almyra Free  ?

## 2022-03-23 NOTE — Chronic Care Management (AMB) (Signed)
?  Care Management  ? ?Note ? ?03/23/2022 ?Name: RIKIA SUKHU MRN: [id number] DOB: May 21, 1951 ? ?BAYLOR TEEGARDEN is a 71 y.o. year old female who is a primary care patient of [name], [name], MD and is actively engaged with the care management team. I reached out to [name] by phone today to assist with scheduling a follow up visit with the Pharmacist ? ?Follow up plan: ?Unsuccessful telephone outreach attempt made. A HIPAA compliant phone message was left for the patient providing contact information and requesting a return call.  ?The care management team will reach out to the patient again over the next 3 days.  ?If patient returns call to provider office, please advise to call [facility] Care Management Care Guide Enrique Manganaro Kandice Robinsons  at [phone] ? ?Noreene Larsson, RMA ?Care Guide, Embedded Care Coordination ?[facility]  Care Management  ?[place], [place] [address] ?Direct Dial: [phone] ?[email or web address] ?Website: [email or web address]  ? ?

## 2022-03-23 NOTE — Progress Notes (Signed)
Open in error

## 2022-03-23 NOTE — Chronic Care Management (AMB) (Signed)
?  Care Management  ? ?Note ? ?03/23/2022 ?Name: SATINA JERRELL MRN: [id number] DOB: [birth year] ? ?KISA FUJII is a 71 y.o. year old female who is a primary care patient of [name], [name], MD and is actively engaged with the care management team. I reached out to [name] by phone today to assist with re-scheduling a follow up visit with the Pharmacist ? ?Follow up plan: ?Unsuccessful telephone outreach attempt made. A HIPAA compliant phone message was left for the patient providing contact information and requesting a return call.  ?The care management team will reach out to the patient again over the next 7 days.  ?If patient returns call to provider office, please advise to call [facility]  at [phone] ? ?Noreene Larsson, RMA ?Care Guide, Embedded Care Coordination ?[facility]  Care Management  ?[place],  [address] ?Direct Dial: [phone] ?[email or web address] ?Website: [email or web address]  ? ?

## 2022-03-24 NOTE — Chronic Care Management (AMB) (Signed)
?  Care Management  ? ?Note ? ?03/24/2022 ?Name: [name] MRN: [id number] DOB: July 13, 1951 ? ?[name] is a 71 y.o. year old female who is a primary care patient of [name], [name], MD and is actively engaged with the care management team. I reached out to [name] by phone today to assist with scheduling an initial visit with the Pharmacist ? ?Follow up plan: ?Telephone appointment with care management team member scheduled for:04/21/2022 ? ?Noreene Larsson, RMA ?Care Guide, Embedded Care Coordination ?[facility]  Care Management  ?[place], [place] [address] ?Direct Dial: [phone] ?[email or web address] ?Website: [email or web address]  ? ?

## 2022-03-25 DIAGNOSIS — D509 Iron deficiency anemia, unspecified: Secondary | ICD-10-CM | POA: Diagnosis not present

## 2022-03-25 DIAGNOSIS — N186 End stage renal disease: Secondary | ICD-10-CM | POA: Diagnosis not present

## 2022-03-25 DIAGNOSIS — Z992 Dependence on renal dialysis: Secondary | ICD-10-CM | POA: Diagnosis not present

## 2022-03-25 DIAGNOSIS — N250 Renal osteodystrophy: Secondary | ICD-10-CM | POA: Diagnosis not present

## 2022-03-25 DIAGNOSIS — Z23 Encounter for immunization: Secondary | ICD-10-CM | POA: Diagnosis not present

## 2022-03-25 DIAGNOSIS — N2581 Secondary hyperparathyroidism of renal origin: Secondary | ICD-10-CM | POA: Diagnosis not present

## 2022-03-26 ENCOUNTER — Inpatient Hospital Stay (HOSPITAL_COMMUNITY)
Admission: EM | Admit: 2022-03-26 | Discharge: 2022-03-31 | DRG: 308 | Disposition: A | Discharge status: Home Health | Payer: Medicare Other | Attending: Family Medicine | Admitting: Family Medicine

## 2022-03-26 ENCOUNTER — Other Ambulatory Visit: Payer: Self-pay

## 2022-03-26 ENCOUNTER — Emergency Department (HOSPITAL_COMMUNITY): Payer: Medicare Other

## 2022-03-26 ENCOUNTER — Encounter (HOSPITAL_COMMUNITY): Payer: Self-pay

## 2022-03-26 DIAGNOSIS — I2729 Other secondary pulmonary hypertension: Secondary | ICD-10-CM | POA: Diagnosis present

## 2022-03-26 DIAGNOSIS — I4891 Unspecified atrial fibrillation: Secondary | ICD-10-CM | POA: Diagnosis present

## 2022-03-26 DIAGNOSIS — E871 Hypo-osmolality and hyponatremia: Secondary | ICD-10-CM | POA: Diagnosis present

## 2022-03-26 DIAGNOSIS — K219 Gastro-esophageal reflux disease without esophagitis: Secondary | ICD-10-CM | POA: Diagnosis present

## 2022-03-26 DIAGNOSIS — Z87891 Personal history of nicotine dependence: Secondary | ICD-10-CM

## 2022-03-26 DIAGNOSIS — I120 Hypertensive chronic kidney disease with stage 5 chronic kidney disease or end stage renal disease: Secondary | ICD-10-CM | POA: Diagnosis not present

## 2022-03-26 DIAGNOSIS — I152 Hypertension secondary to endocrine disorders: Secondary | ICD-10-CM | POA: Diagnosis not present

## 2022-03-26 DIAGNOSIS — I2510 Atherosclerotic heart disease of native coronary artery without angina pectoris: Secondary | ICD-10-CM | POA: Diagnosis present

## 2022-03-26 DIAGNOSIS — T82838A Hemorrhage of vascular prosthetic devices, implants and grafts, initial encounter: Secondary | ICD-10-CM | POA: Diagnosis present

## 2022-03-26 DIAGNOSIS — Z833 Family history of diabetes mellitus: Secondary | ICD-10-CM

## 2022-03-26 DIAGNOSIS — I509 Heart failure, unspecified: Secondary | ICD-10-CM

## 2022-03-26 DIAGNOSIS — E1169 Type 2 diabetes mellitus with other specified complication: Secondary | ICD-10-CM | POA: Diagnosis not present

## 2022-03-26 DIAGNOSIS — D72829 Elevated white blood cell count, unspecified: Secondary | ICD-10-CM | POA: Diagnosis present

## 2022-03-26 DIAGNOSIS — D638 Anemia in other chronic diseases classified elsewhere: Secondary | ICD-10-CM | POA: Diagnosis present

## 2022-03-26 DIAGNOSIS — J449 Chronic obstructive pulmonary disease, unspecified: Secondary | ICD-10-CM | POA: Diagnosis present

## 2022-03-26 DIAGNOSIS — R061 Stridor: Secondary | ICD-10-CM | POA: Diagnosis not present

## 2022-03-26 DIAGNOSIS — R531 Weakness: Secondary | ICD-10-CM | POA: Diagnosis not present

## 2022-03-26 DIAGNOSIS — E785 Hyperlipidemia, unspecified: Secondary | ICD-10-CM | POA: Diagnosis present

## 2022-03-26 DIAGNOSIS — M898X9 Other specified disorders of bone, unspecified site: Secondary | ICD-10-CM | POA: Diagnosis present

## 2022-03-26 DIAGNOSIS — E1140 Type 2 diabetes mellitus with diabetic neuropathy, unspecified: Secondary | ICD-10-CM | POA: Diagnosis present

## 2022-03-26 DIAGNOSIS — J810 Acute pulmonary edema: Secondary | ICD-10-CM | POA: Diagnosis not present

## 2022-03-26 DIAGNOSIS — Z79899 Other long term (current) drug therapy: Secondary | ICD-10-CM | POA: Diagnosis not present

## 2022-03-26 DIAGNOSIS — I4819 Other persistent atrial fibrillation: Principal | ICD-10-CM | POA: Diagnosis present

## 2022-03-26 DIAGNOSIS — E1159 Type 2 diabetes mellitus with other circulatory complications: Secondary | ICD-10-CM | POA: Diagnosis not present

## 2022-03-26 DIAGNOSIS — I2781 Cor pulmonale (chronic): Secondary | ICD-10-CM | POA: Diagnosis present

## 2022-03-26 DIAGNOSIS — N2581 Secondary hyperparathyroidism of renal origin: Secondary | ICD-10-CM | POA: Diagnosis not present

## 2022-03-26 DIAGNOSIS — R7989 Other specified abnormal findings of blood chemistry: Secondary | ICD-10-CM | POA: Diagnosis present

## 2022-03-26 DIAGNOSIS — Z48812 Encounter for surgical aftercare following surgery on the circulatory system: Secondary | ICD-10-CM | POA: Diagnosis not present

## 2022-03-26 DIAGNOSIS — I25119 Atherosclerotic heart disease of native coronary artery with unspecified angina pectoris: Secondary | ICD-10-CM | POA: Diagnosis not present

## 2022-03-26 DIAGNOSIS — Z9842 Cataract extraction status, left eye: Secondary | ICD-10-CM

## 2022-03-26 DIAGNOSIS — Z955 Presence of coronary angioplasty implant and graft: Secondary | ICD-10-CM

## 2022-03-26 DIAGNOSIS — N186 End stage renal disease: Secondary | ICD-10-CM | POA: Diagnosis present

## 2022-03-26 DIAGNOSIS — Y92239 Unspecified place in hospital as the place of occurrence of the external cause: Secondary | ICD-10-CM | POA: Diagnosis present

## 2022-03-26 DIAGNOSIS — D631 Anemia in chronic kidney disease: Secondary | ICD-10-CM | POA: Diagnosis not present

## 2022-03-26 DIAGNOSIS — I132 Hypertensive heart and chronic kidney disease with heart failure and with stage 5 chronic kidney disease, or end stage renal disease: Secondary | ICD-10-CM | POA: Diagnosis not present

## 2022-03-26 DIAGNOSIS — F32A Depression, unspecified: Secondary | ICD-10-CM | POA: Diagnosis not present

## 2022-03-26 DIAGNOSIS — J811 Chronic pulmonary edema: Secondary | ICD-10-CM | POA: Diagnosis not present

## 2022-03-26 DIAGNOSIS — Z992 Dependence on renal dialysis: Secondary | ICD-10-CM

## 2022-03-26 DIAGNOSIS — I5033 Acute on chronic diastolic (congestive) heart failure: Secondary | ICD-10-CM | POA: Diagnosis present

## 2022-03-26 DIAGNOSIS — Z0389 Encounter for observation for other suspected diseases and conditions ruled out: Secondary | ICD-10-CM | POA: Diagnosis not present

## 2022-03-26 DIAGNOSIS — Z881 Allergy status to other antibiotic agents status: Secondary | ICD-10-CM

## 2022-03-26 DIAGNOSIS — I252 Old myocardial infarction: Secondary | ICD-10-CM

## 2022-03-26 DIAGNOSIS — I272 Pulmonary hypertension, unspecified: Secondary | ICD-10-CM | POA: Diagnosis not present

## 2022-03-26 DIAGNOSIS — N185 Chronic kidney disease, stage 5: Secondary | ICD-10-CM | POA: Diagnosis not present

## 2022-03-26 DIAGNOSIS — Z90710 Acquired absence of both cervix and uterus: Secondary | ICD-10-CM

## 2022-03-26 DIAGNOSIS — F419 Anxiety disorder, unspecified: Secondary | ICD-10-CM | POA: Diagnosis not present

## 2022-03-26 DIAGNOSIS — Z888 Allergy status to other drugs, medicaments and biological substances status: Secondary | ICD-10-CM

## 2022-03-26 DIAGNOSIS — I472 Ventricular tachycardia, unspecified: Secondary | ICD-10-CM | POA: Diagnosis not present

## 2022-03-26 DIAGNOSIS — E1165 Type 2 diabetes mellitus with hyperglycemia: Secondary | ICD-10-CM | POA: Diagnosis present

## 2022-03-26 DIAGNOSIS — Y841 Kidney dialysis as the cause of abnormal reaction of the patient, or of later complication, without mention of misadventure at the time of the procedure: Secondary | ICD-10-CM | POA: Diagnosis present

## 2022-03-26 DIAGNOSIS — E1122 Type 2 diabetes mellitus with diabetic chronic kidney disease: Secondary | ICD-10-CM | POA: Diagnosis not present

## 2022-03-26 DIAGNOSIS — Z9841 Cataract extraction status, right eye: Secondary | ICD-10-CM

## 2022-03-26 DIAGNOSIS — J441 Chronic obstructive pulmonary disease with (acute) exacerbation: Secondary | ICD-10-CM | POA: Diagnosis present

## 2022-03-26 DIAGNOSIS — T380X5A Adverse effect of glucocorticoids and synthetic analogues, initial encounter: Secondary | ICD-10-CM | POA: Diagnosis present

## 2022-03-26 DIAGNOSIS — Z8673 Personal history of transient ischemic attack (TIA), and cerebral infarction without residual deficits: Secondary | ICD-10-CM

## 2022-03-26 DIAGNOSIS — E876 Hypokalemia: Secondary | ICD-10-CM | POA: Diagnosis present

## 2022-03-26 DIAGNOSIS — Z7951 Long term (current) use of inhaled steroids: Secondary | ICD-10-CM

## 2022-03-26 DIAGNOSIS — Z885 Allergy status to narcotic agent status: Secondary | ICD-10-CM

## 2022-03-26 DIAGNOSIS — R0603 Acute respiratory distress: Secondary | ICD-10-CM

## 2022-03-26 DIAGNOSIS — I700 Atherosclerosis of aorta: Secondary | ICD-10-CM | POA: Diagnosis not present

## 2022-03-26 DIAGNOSIS — E1121 Type 2 diabetes mellitus with diabetic nephropathy: Secondary | ICD-10-CM | POA: Diagnosis present

## 2022-03-26 DIAGNOSIS — R5381 Other malaise: Secondary | ICD-10-CM | POA: Diagnosis not present

## 2022-03-26 DIAGNOSIS — Z880 Allergy status to penicillin: Secondary | ICD-10-CM

## 2022-03-26 DIAGNOSIS — Z961 Presence of intraocular lens: Secondary | ICD-10-CM | POA: Diagnosis present

## 2022-03-26 DIAGNOSIS — N250 Renal osteodystrophy: Secondary | ICD-10-CM | POA: Diagnosis not present

## 2022-03-26 DIAGNOSIS — Z794 Long term (current) use of insulin: Secondary | ICD-10-CM

## 2022-03-26 DIAGNOSIS — R0602 Shortness of breath: Secondary | ICD-10-CM | POA: Diagnosis not present

## 2022-03-26 DIAGNOSIS — Z8249 Family history of ischemic heart disease and other diseases of the circulatory system: Secondary | ICD-10-CM

## 2022-03-26 LAB — COMPREHENSIVE METABOLIC PANEL
ALT: 18 U/L (ref 0–44)
AST: 20 U/L (ref 15–41)
Albumin: 3.9 g/dL (ref 3.5–5.0)
Alkaline Phosphatase: 68 U/L (ref 38–126)
Anion gap: 14 (ref 5–15)
BUN: 22 mg/dL (ref 8–23)
CO2: 27 mmol/L (ref 22–32)
Calcium: 8.6 mg/dL — ABNORMAL LOW (ref 8.9–10.3)
Chloride: 90 mmol/L — ABNORMAL LOW (ref 98–111)
Creatinine, Ser: 6.72 mg/dL — ABNORMAL HIGH (ref 0.44–1.00)
GFR, Estimated: 6 mL/min — ABNORMAL LOW (ref >60)
Glucose, Bld: 168 mg/dL — ABNORMAL HIGH (ref 70–99)
Potassium: 3.2 mmol/L — ABNORMAL LOW (ref 3.5–5.1)
Sodium: 131 mmol/L — ABNORMAL LOW (ref 135–145)
Total Bilirubin: 0.6 mg/dL (ref 0.3–1.2)
Total Protein: 7.1 g/dL (ref 6.5–8.1)

## 2022-03-26 LAB — CBC
HCT: 30.8 % — ABNORMAL LOW (ref 36.0–46.0)
Hemoglobin: 9.8 g/dL — ABNORMAL LOW (ref 12.0–15.0)
MCH: 28.7 pg (ref 26.0–34.0)
MCHC: 31.8 g/dL (ref 30.0–36.0)
MCV: 90.3 fL (ref 80.0–100.0)
Platelets: 240 10*3/uL (ref 150–400)
RBC: 3.41 MIL/uL — ABNORMAL LOW (ref 3.87–5.11)
RDW: 17.7 % — ABNORMAL HIGH (ref 11.5–15.5)
WBC: 15.2 10*3/uL — ABNORMAL HIGH (ref 4.0–10.5)
nRBC: 0.1 % (ref 0.0–0.2)

## 2022-03-26 LAB — GLUCOSE, CAPILLARY: Glucose-Capillary: 209 mg/dL — ABNORMAL HIGH (ref 70–99)

## 2022-03-26 LAB — TROPONIN I (HIGH SENSITIVITY)
Troponin I (High Sensitivity): 77 ng/L — ABNORMAL HIGH (ref <18)
Troponin I (High Sensitivity): 84 ng/L — ABNORMAL HIGH (ref <18)

## 2022-03-26 LAB — PROTIME-INR
INR: 1.9 — ABNORMAL HIGH (ref 0.8–1.2)
Prothrombin Time: 22 seconds — ABNORMAL HIGH (ref 11.4–15.2)

## 2022-03-26 LAB — MAGNESIUM: Magnesium: 1.9 mg/dL (ref 1.7–2.4)

## 2022-03-26 LAB — BRAIN NATRIURETIC PEPTIDE: B Natriuretic Peptide: 2276 pg/mL — ABNORMAL HIGH (ref 0.0–100.0)

## 2022-03-26 LAB — CBG MONITORING, ED: Glucose-Capillary: 245 mg/dL — ABNORMAL HIGH (ref 70–99)

## 2022-03-26 MED ORDER — CHLORHEXIDINE GLUCONATE CLOTH 2 % EX PADS
6.0000 | MEDICATED_PAD | Freq: Every day | CUTANEOUS | Status: DC
  Administered 2022-03-26 – 2022-03-31 (×5): 6 via TOPICAL

## 2022-03-26 MED ORDER — ACETAMINOPHEN 325 MG PO TABS
650.0000 mg | ORAL_TABLET | Freq: Four times a day (QID) | ORAL | Status: DC | PRN
  Administered 2022-03-26 – 2022-03-30 (×2): 650 mg via ORAL
  Filled 2022-03-26 (×2): qty 2

## 2022-03-26 MED ORDER — CALCIUM ACETATE 667 MG PO CAPS: 667.0000 mg | ORAL_CAPSULE | ORAL | Status: DC

## 2022-03-26 MED ORDER — CALCIUM ACETATE (PHOS BINDER) 667 MG PO CAPS
1334.0000 mg | ORAL_CAPSULE | Freq: Three times a day (TID) | ORAL | Status: DC
  Administered 2022-03-27 – 2022-03-31 (×9): 1334 mg via ORAL
  Filled 2022-03-26 (×14): qty 2

## 2022-03-26 MED ORDER — INSULIN GLARGINE-YFGN 100 UNIT/ML ~~LOC~~ SOLN: 15.0000 [IU] | Freq: Every day | SUBCUTANEOUS | Status: DC

## 2022-03-26 MED ORDER — INSULIN GLARGINE-YFGN 100 UNIT/ML ~~LOC~~ SOLN
25.0000 [IU] | Freq: Every day | SUBCUTANEOUS | Status: DC
  Administered 2022-03-26: 25 [IU] via SUBCUTANEOUS
  Filled 2022-03-26 (×2): qty 0.25

## 2022-03-26 MED ORDER — IPRATROPIUM BROMIDE 0.02 % IN SOLN
RESPIRATORY_TRACT | Status: AC
  Administered 2022-03-26: 0.5 mg
  Filled 2022-03-26: qty 2.5

## 2022-03-26 MED ORDER — SODIUM CHLORIDE 0.9% FLUSH
3.0000 mL | Freq: Two times a day (BID) | INTRAVENOUS | Status: DC
  Administered 2022-03-26 – 2022-03-31 (×9): 3 mL via INTRAVENOUS

## 2022-03-26 MED ORDER — SODIUM CHLORIDE 0.9% FLUSH
3.0000 mL | Freq: Two times a day (BID) | INTRAVENOUS | Status: DC
  Administered 2022-03-26 – 2022-03-29 (×5): 3 mL via INTRAVENOUS

## 2022-03-26 MED ORDER — HYDRALAZINE HCL 20 MG/ML IJ SOLN
10.0000 mg | Freq: Four times a day (QID) | INTRAMUSCULAR | Status: DC | PRN
Start: 2022-03-26 — End: 2022-03-31

## 2022-03-26 MED ORDER — ATENOLOL 25 MG PO TABS
50.0000 mg | ORAL_TABLET | Freq: Two times a day (BID) | ORAL | Status: DC
  Administered 2022-03-26 – 2022-03-28 (×4): 50 mg via ORAL
  Filled 2022-03-26 (×4): qty 2

## 2022-03-26 MED ORDER — GABAPENTIN 400 MG PO CAPS
800.0000 mg | ORAL_CAPSULE | ORAL | Status: DC
  Administered 2022-03-27 – 2022-03-29 (×2): 800 mg via ORAL
  Filled 2022-03-26 (×3): qty 2

## 2022-03-26 MED ORDER — ONDANSETRON HCL 4 MG/2ML IJ SOLN
4.0000 mg | Freq: Four times a day (QID) | INTRAMUSCULAR | Status: DC | PRN
  Administered 2022-03-27 – 2022-03-29 (×3): 4 mg via INTRAVENOUS
  Filled 2022-03-26 (×3): qty 2

## 2022-03-26 MED ORDER — QUETIAPINE FUMARATE 100 MG PO TABS
200.0000 mg | ORAL_TABLET | Freq: Every day | ORAL | Status: DC
  Administered 2022-03-26 – 2022-03-30 (×5): 200 mg via ORAL
  Filled 2022-03-26 (×5): qty 2

## 2022-03-26 MED ORDER — MOMETASONE FURO-FORMOTEROL FUM 200-5 MCG/ACT IN AERO
2.0000 | INHALATION_SPRAY | Freq: Two times a day (BID) | RESPIRATORY_TRACT | Status: DC
  Administered 2022-03-26 – 2022-03-31 (×10): 2 via RESPIRATORY_TRACT
  Filled 2022-03-26 (×2): qty 8.8

## 2022-03-26 MED ORDER — FUROSEMIDE 10 MG/ML IJ SOLN
80.0000 mg | Freq: Two times a day (BID) | INTRAMUSCULAR | Status: AC
  Administered 2022-03-26 – 2022-03-28 (×4): 80 mg via INTRAVENOUS
  Filled 2022-03-26 (×4): qty 8

## 2022-03-26 MED ORDER — METHYLPREDNISOLONE SODIUM SUCC 40 MG IJ SOLR
40.0000 mg | Freq: Two times a day (BID) | INTRAMUSCULAR | Status: AC
  Administered 2022-03-26 – 2022-03-28 (×4): 40 mg via INTRAVENOUS
  Filled 2022-03-26 (×4): qty 1

## 2022-03-26 MED ORDER — ONDANSETRON HCL 4 MG PO TABS: 4.0000 mg | ORAL_TABLET | Freq: Four times a day (QID) | ORAL | Status: DC | PRN

## 2022-03-26 MED ORDER — DM-GUAIFENESIN ER 30-600 MG PO TB12
1.0000 | ORAL_TABLET | Freq: Two times a day (BID) | ORAL | Status: DC
  Administered 2022-03-26 – 2022-03-29 (×6): 1 via ORAL
  Filled 2022-03-26 (×6): qty 1

## 2022-03-26 MED ORDER — HYDROXYZINE HCL 25 MG PO TABS
50.0000 mg | ORAL_TABLET | Freq: Three times a day (TID) | ORAL | Status: DC | PRN
  Administered 2022-03-26 – 2022-03-27 (×2): 50 mg via ORAL
  Filled 2022-03-26 (×2): qty 2

## 2022-03-26 MED ORDER — CALCIUM ACETATE (PHOS BINDER) 667 MG PO CAPS: 667.0000 mg | ORAL_CAPSULE | Freq: Three times a day (TID) | ORAL | Status: DC | PRN

## 2022-03-26 MED ORDER — INSULIN ASPART 100 UNIT/ML IJ SOLN
0.0000 [IU] | Freq: Every day | INTRAMUSCULAR | Status: DC
  Administered 2022-03-26: 2 [IU] via SUBCUTANEOUS
  Administered 2022-03-29: 3 [IU] via SUBCUTANEOUS

## 2022-03-26 MED ORDER — POTASSIUM CHLORIDE CRYS ER 20 MEQ PO TBCR
20.0000 meq | EXTENDED_RELEASE_TABLET | Freq: Once | ORAL | Status: AC
  Administered 2022-03-26: 20 meq via ORAL
  Filled 2022-03-26: qty 1

## 2022-03-26 MED ORDER — BISACODYL 10 MG RE SUPP: 10.0000 mg | Freq: Every day | RECTAL | Status: DC | PRN

## 2022-03-26 MED ORDER — APIXABAN 5 MG PO TABS
5.0000 mg | ORAL_TABLET | Freq: Two times a day (BID) | ORAL | Status: DC
Start: 2022-03-26 — End: 2022-03-31
  Administered 2022-03-26 – 2022-03-31 (×9): 5 mg via ORAL
  Filled 2022-03-26 (×10): qty 1

## 2022-03-26 MED ORDER — ALBUTEROL SULFATE (2.5 MG/3ML) 0.083% IN NEBU
2.5000 mg | INHALATION_SOLUTION | Freq: Four times a day (QID) | RESPIRATORY_TRACT | Status: DC | PRN
  Administered 2022-03-26 – 2022-03-27 (×2): 2.5 mg via RESPIRATORY_TRACT
  Filled 2022-03-26 (×2): qty 3

## 2022-03-26 MED ORDER — FAMOTIDINE 20 MG PO TABS
20.0000 mg | ORAL_TABLET | Freq: Two times a day (BID) | ORAL | Status: DC
Start: 2022-03-26 — End: 2022-03-27
  Administered 2022-03-26 – 2022-03-27 (×2): 20 mg via ORAL
  Filled 2022-03-26 (×2): qty 1

## 2022-03-26 MED ORDER — VANCOMYCIN HCL 1500 MG/300ML IV SOLN
1500.0000 mg | Freq: Once | INTRAVENOUS | Status: AC
  Administered 2022-03-26: 1500 mg via INTRAVENOUS
  Filled 2022-03-26: qty 300

## 2022-03-26 MED ORDER — ROSUVASTATIN CALCIUM 10 MG PO TABS
5.0000 mg | ORAL_TABLET | Freq: Every day | ORAL | Status: DC
  Administered 2022-03-26 – 2022-03-31 (×6): 5 mg via ORAL
  Filled 2022-03-26 (×6): qty 1

## 2022-03-26 MED ORDER — ALBUTEROL SULFATE (2.5 MG/3ML) 0.083% IN NEBU
INHALATION_SOLUTION | RESPIRATORY_TRACT | Status: AC
  Administered 2022-03-26: 5 mg
  Filled 2022-03-26: qty 6

## 2022-03-26 MED ORDER — SODIUM CHLORIDE 0.9 % IV SOLN: 250.0000 mL | INTRAVENOUS | Status: DC | PRN

## 2022-03-26 MED ORDER — POTASSIUM CHLORIDE CRYS ER 20 MEQ PO TBCR
40.0000 meq | EXTENDED_RELEASE_TABLET | ORAL | Status: AC
Start: 2022-03-26 — End: 2022-03-26
  Administered 2022-03-26 (×2): 40 meq via ORAL
  Filled 2022-03-26 (×2): qty 2

## 2022-03-26 MED ORDER — SODIUM CHLORIDE 0.9% FLUSH: 3.0000 mL | INTRAVENOUS | Status: DC | PRN

## 2022-03-26 MED ORDER — ROPINIROLE HCL 1 MG PO TABS
1.0000 mg | ORAL_TABLET | Freq: Every day | ORAL | Status: DC
  Administered 2022-03-26 – 2022-03-30 (×5): 1 mg via ORAL
  Filled 2022-03-26 (×5): qty 1

## 2022-03-26 MED ORDER — INSULIN ASPART 100 UNIT/ML IJ SOLN
3.0000 [IU] | Freq: Three times a day (TID) | INTRAMUSCULAR | Status: DC
  Administered 2022-03-26 – 2022-03-31 (×11): 3 [IU] via SUBCUTANEOUS
  Filled 2022-03-26: qty 1

## 2022-03-26 MED ORDER — DILTIAZEM LOAD VIA INFUSION
20.0000 mg | Freq: Once | INTRAVENOUS | Status: AC
  Administered 2022-03-26: 20 mg via INTRAVENOUS
  Filled 2022-03-26: qty 20

## 2022-03-26 MED ORDER — IPRATROPIUM-ALBUTEROL 0.5-2.5 (3) MG/3ML IN SOLN
3.0000 mL | Freq: Once | RESPIRATORY_TRACT | Status: AC
  Administered 2022-03-26: 3 mL via RESPIRATORY_TRACT
  Filled 2022-03-26: qty 3

## 2022-03-26 MED ORDER — TICAGRELOR 90 MG PO TABS
90.0000 mg | ORAL_TABLET | Freq: Two times a day (BID) | ORAL | Status: DC
  Administered 2022-03-26 – 2022-03-31 (×9): 90 mg via ORAL
  Filled 2022-03-26 (×10): qty 1

## 2022-03-26 MED ORDER — POLYETHYLENE GLYCOL 3350 17 G PO PACK: 17.0000 g | PACK | Freq: Every day | ORAL | Status: DC | PRN

## 2022-03-26 MED ORDER — INSULIN ASPART 100 UNIT/ML IJ SOLN
0.0000 [IU] | Freq: Three times a day (TID) | INTRAMUSCULAR | Status: DC
  Administered 2022-03-26 – 2022-03-27 (×2): 5 [IU] via SUBCUTANEOUS
  Administered 2022-03-27: 15 [IU] via SUBCUTANEOUS
  Administered 2022-03-27: 3 [IU] via SUBCUTANEOUS
  Administered 2022-03-28: 5 [IU] via SUBCUTANEOUS
  Administered 2022-03-28: 8 [IU] via SUBCUTANEOUS
  Administered 2022-03-28: 15 [IU] via SUBCUTANEOUS
  Administered 2022-03-29: 3 [IU] via SUBCUTANEOUS
  Administered 2022-03-29: 2 [IU] via SUBCUTANEOUS
  Administered 2022-03-30 – 2022-03-31 (×2): 5 [IU] via SUBCUTANEOUS

## 2022-03-26 MED ORDER — SODIUM CHLORIDE 0.9 % IV SOLN
1.0000 g | Freq: Once | INTRAVENOUS | Status: AC
  Administered 2022-03-26: 1 g via INTRAVENOUS
  Filled 2022-03-26 (×2): qty 10

## 2022-03-26 MED ORDER — ACETAMINOPHEN 650 MG RE SUPP: 650.0000 mg | Freq: Four times a day (QID) | RECTAL | Status: DC | PRN

## 2022-03-26 MED ORDER — DILTIAZEM HCL-DEXTROSE 125-5 MG/125ML-% IV SOLN (PREMIX)
5.0000 mg/h | INTRAVENOUS | Status: DC
  Administered 2022-03-26: 5 mg/h via INTRAVENOUS
  Filled 2022-03-26 (×2): qty 125

## 2022-03-26 NOTE — H&P (Signed)
?                                                                                           ? ? Patient Demographics:  ? ? ?[name], is a 71 y.o. female  MRN: [id number]   DOB - 11-10-1951 ? ?Admit Date - 03/26/2022 ? ?Outpatient Primary MD for the patient is [name], MD ? ? Assessment & Plan:  ? ?Assessment and Plan: ? ?1)PAFib with RVR--first diagnosed earlier this month ?-Tachycardia persist despite IV Cardizem bolus ?-Admit to stepdown unit for continuous Cardizem drip ?-Potassium is 3.2, magnesium is 1.9, TSH 3.29 ?-Continue Eliquis ?-Continue PTA atenolol ?-Echo from 03/05/2022 with EF of 55% with severe pulmonary hypertension and mildly dilated right and left atrium ? ?2)HFpEF--- acute on chronic diastolic dysfunction exacerbation in the setting of #1 above ?-Chest x-ray consistent with pulmonary congestion ?-BNP 2276 up from 1600 recently ?-Give IV Lasix ?-Consider extra hemodialysis session to address volume status ?-Recent echo as above #1 ? ?3)CAD/Recent NSTEMI--- troponin 77, down from 181 about 3 weeks ago ?-LHC on 03/06/2022 consistent with severe two-vessel disease involving RCA and LAD ?-Status post coronary atherectomy/angioplasty PCI on 03/08/2022 ?-Deemed to be too high risk a candidate for CABG ?-Continue Brilinta, Crestor and atenolol ? ?4)Leukocytosis--- WBC is 15.2 patient received vancomycin in the ED ?-Patient does not make a whole lot of urine but will check UA ?-Chest x-ray not suggestive of pneumonia ?-Will not require further doses until next hemodialysis session ?-Cultures obtained ? ?5)Depression/anxiety--continue Seroquel 200 mg nightly along with hydroxyzine as needed anxiety ? ?6)ESRD--TTS, last HD 03/25/2022 completed 4 hours of HD ?-Metabolic Bone Disease: Continue PhosLo for hyperphosphatemia. ?-Nephrology consult for hemodialysis ?Patient has exacerbation of CHF/volume overload may need extra  hemodialysis session to address volume status ? ?7)DM2-A1c 8.0 reflecting uncontrolled diabetes PTA ?Continue Lantus insulin ?-Anticipate worsening hyperglycemia with steroids ?Use Novolog/Humalog Sliding scale insulin with Accu-Cheks/Fingersticks as ordered  ? ?8)HTN-----BP is elevated, continue atenolol, IV Cardizem drip and IV Lasix ?-May use IV hydralazine as needed elevated BP ? ?9)COPD with pulmonary hypertension--patient with significant dyspnea, cough and slight wheezing ?-Give Solu-Medrol along with  mucolytics ?-Be judicious with bronchodilators in the setting of A-fib with RVR ? ?10) chronic anemia of ESRD---- ?Hgb stable at 9.8 ?monitor closely especially while on Brilinta and Eliquis ?-Defer decision on ESA/Procrit to nephrology team ? ?11) hypokalemia--- magnesium WNL, replace potassium and recheck ? ?12) anticoagulation--- given angioplasty and stenting on 03/08/2022 in the setting of A-fib patient was advised to take Eliquis and Brilinta for 3 months through July 2023 and then transition most likely to aspirin ? ?Disposition/Need for in-Hospital Stay- patient unable to be discharged at this time due to -A-fib with RVR requiring IV Cardizem for rate control, CHF exacerbation requiring IV Lasix and possible extra hemodialysis session to address volume status* ? ?Status is: Inpatient ? ?Remains inpatient appropriate because:  ? ?Dispo: The patient is from: Home ?             Anticipated d/c is to: Home ?             Anticipated  d/c date is: 2 days ?             Patient currently is not medically stable to d/c. ?Barriers: Not Clinically Stable-  ? ?With History of - ?Reviewed by me ? ?Past Medical History:  ?Diagnosis Date  ? Acute on chronic diastolic CHF (congestive heart failure) ([place]) 03/05/2015  ? Grade 2. EF 60-65%  ? Anemia   ? hx of  ? Anxiety   ? Arthritis   ? CAD (coronary artery disease)   ? s/p Stenting in 2005;  [place] 7/09: Vigorous LV function, 2-3+ MR, RI 40%, RI stent patent, proximal-mid RCA  40-50%;  Echo 7/09:  EF 55-65%, trivial MR;  [facility] 11/18/12: EF 66%, normal LV wall motion, mild to moderate anterior ischemia - reviewed by [facility] and felt to rep breast atten (low risk);  Echo 6/14: mild LVH, EF 55-65%, Gr 2 DD, PASP 44, trivial eff   ? Chronic kidney disease   ? CREATININE IS UP--GOING TO KIDNEY MD   ? Chronic neck pain   ? COPD (chronic obstructive pulmonary disease) ([place])   ? Degenerative joint disease   ? left shoulder  ? Diabetic neuropathy ([place])   ? Diverticulosis   ? Esophageal stricture   ? GERD (gastroesophageal reflux disease)   ? Hyperlipidemia   ? Hypertension   ? IDDM (insulin dependent diabetes mellitus)   ? Pericardial effusion 03/06/2015  ? Very small per echo ; pleural nodules seen on CT chest.  ? Peripheral vascular disease ([place])   ? Stroke [facility])   ? "mini- years ago"  ? Vitamin D deficiency   ?   ? ?Past Surgical History:  ?Procedure Laterality Date  ? A/V FISTULAGRAM N/A 08/10/2017  ? Procedure: A/V Fistulagram - Right Arm;  Surgeon: Elam Dutch, MD;  Location: [place] [facility];  Service: Cardiovascular;  Laterality: N/A;  ? A/V FISTULAGRAM N/A 03/03/2020  ? Procedure: A/V FISTULAGRAM - Right Arm;  Surgeon: Marty Heck, MD;  Location: [place] [facility];  Service: Cardiovascular;  Laterality: N/A;  ? ABDOMINAL HYSTERECTOMY    ? AV FISTULA PLACEMENT Right 07/20/2015  ? Procedure: RADIAL CEPHALIC ARTERIOVENOUS FISTULA CREATION RIGHT ARM;  Surgeon: Angelia Mould, MD;  Location: [place];  Service: Vascular;  Laterality: Right;  ? AV FISTULA PLACEMENT Right 11/26/2015  ? Procedure:  Creation of RIGHT BRACHIO-CEPHALIC arteriovenous fistula;  Surgeon: Angelia Mould, MD;  Location: [place];  Service: Vascular;  Laterality: Right;  ? BACK SURGERY    ? 2007 & 2013  ? BLADDER SURGERY    ? Tack  ? CARDIAC CATHETERIZATION  2003, 2005, 2009  ? 1 stent  ? cardiac stents    ? CATARACT EXTRACTION W/PHACO Right 05/04/2014  ? Procedure: CATARACT EXTRACTION PHACO AND INTRAOCULAR  LENS PLACEMENT (IOC);  Surgeon: Williams Che, MD;  Location: [facility];  Service: Ophthalmology;  Laterality: Right;  CDE 1.47  ? CATARACT EXTRACTION W/PHACO Left 07/20/2014  ? Procedure: CATARACT EXTRACTION WITH PHACO AND INTRAOCULAR LENS PLACEMENT LEFT EYE CDE=1.79;  Surgeon: Williams Che, MD;  Location: [facility];  Service: Ophthalmology;  Laterality: Left;  ? CERVICAL SPINE SURGERY  2012  ? 8 screws  ? CORONARY ATHERECTOMY N/A 03/08/2022  ? Procedure: CORONARY ATHERECTOMY;  Surgeon: Sherren Mocha, MD;  Location: [place] [facility];  Service: Cardiovascular;  Laterality: N/A;  ? CYSTOSCOPY WITH INJECTION N/A 06/03/2013  ? Procedure: MACROPLASTIQUE  INJECTION ;  Surgeon: Malka So, MD;  Location:  [facility];  Service: Urology;  Laterality: N/A;  ? FISTULOGRAM Right 10/20/2016  ? Procedure: FISTULOGRAM WITH POSSIBLE INTERVENTION;  Surgeon: [name], MD;  Location: [facility];  Service: Vascular;  Laterality: Right;  ? INSERTION OF DIALYSIS CATHETER N/A 11/26/2015  ? Procedure: INSERTION OF DIALYSIS CATHETER;  Surgeon: Angelia Mould, MD;  Location: [place];  Service: Vascular;  Laterality: N/A;  ? INTRAVASCULAR IMAGING/OCT N/A 03/08/2022  ? Procedure: INTRAVASCULAR IMAGING/OCT;  Surgeon: Sherren Mocha, MD;  Location: [place] [facility];  Service: Cardiovascular;  Laterality: N/A;  ? LAMINECTOMY  12/29/2011  ? LEFT HEART CATH AND CORONARY ANGIOGRAPHY N/A 03/06/2022  ? Procedure: LEFT HEART CATH AND CORONARY ANGIOGRAPHY;  Surgeon: Nelva Bush, MD;  Location: [place] [facility];  Service: Cardiovascular;  Laterality: N/A;  ? LUMBAR LAMINECTOMY/DECOMPRESSION MICRODISCECTOMY  12/29/2011  ? Procedure: LUMBAR LAMINECTOMY/DECOMPRESSION MICRODISCECTOMY;  Surgeon: Floyce Stakes, MD;  Location: [place] [facility];  Service: Neurosurgery;  Laterality: N/A;  Lumbar Two through Lumbar Five Laminectomies/Cellsaver  ? PARTIAL HYSTERECTOMY    ? PERIPHERAL VASCULAR BALLOON ANGIOPLASTY  03/03/2020  ? Procedure: PERIPHERAL  VASCULAR BALLOON ANGIOPLASTY;  Surgeon: Marty Heck, MD;  Location: [place] [facility];  Service: Cardiovascular;;  Rt arm fistula  ? PERIPHERAL VASCULAR BALLOON ANGIOPLASTY Right 04/15/2021  ? Procedure:

## 2022-03-26 NOTE — ED Notes (Signed)
Patient assisted to bedside commode, unable to provide urine specimen. Bladder scan performed 45 cc of urine detected.  ?

## 2022-03-26 NOTE — Progress Notes (Signed)
Hello Delayla, ? ?Your lab result is normal and/or stable.Some minor variations that are not significant are commonly marked abnormal, but do not represent any medical problem for you. ? ?Best regards, ?[name], M.D.

## 2022-03-26 NOTE — ED Notes (Signed)
Monitoring  patients  intake and output, gave patient 120 cc of water, oral care given. ?

## 2022-03-26 NOTE — Progress Notes (Signed)
Pharmacy Antibiotic Note ? ?[name] is a 71 y.o. female admitted on 03/26/2022 with pneumonia.  Pharmacy has been consulted for Vancomycin dosing. ? ?Plan: (SCr 6.52) ?Vancomycin 1500mg  IV today x 1 in ED ?F/U dialysis schedule for further dosing ? ?Height: 5\' 3"  (160 cm) ?Weight: 85.3 kg (188 lb) ?IBW/kg (Calculated) : 52.4 ? ?Temp (24hrs), Avg:97.7 ?F (36.5 ?C), Min:97.7 ?F (36.5 ?C), Max:97.7 ?F (36.5 ?C) ? ?Recent Labs  ?Lab 03/26/22 ?1407  ?WBC 15.2*  ?  ?Estimated Creatinine Clearance: 8.4 mL/min (A) (by C-G formula based on SCr of 6.52 mg/dL [facility])).   ? ?Allergies  ?Allergen Reactions  ? Azithromycin   ?  Prolonged QT  ? Ace Inhibitors Cough  ? Clopidogrel Bisulfate Other (See Comments)  ?  Sick, Headache, "Felt terrible"  ? Codeine Other (See Comments)  ?  hallucinations  ? Lisinopril Cough  ? Penicillins Other (See Comments)  ?  Unknown reaction ?Did it involve swelling of the face/tongue/throat, SOB, or low BP? Unknown ?Did it involve sudden or severe rash/hives, skin peeling, or any reaction on the inside of your mouth or nose? Unknown ?Did you need to seek medical attention at a hospital or doctor's office? Unknown ?When did it last happen?      childhood allergy ?If all above answers are "NO", may proceed with cephalosporin use. ? ?  ? ? ?Antimicrobials this admission: ?Vancomycin 4/23 >>  ?Cefepime 4/23 >>  ? ?Dose adjustments this admission: ? ? ?Microbiology results: ? BCx: pending ? UCx: pending  ? Sputum:   ? MRSA PCR:  ? ?Thank you for allowing pharmacy to be a part of this patient?s care. ? ?Hart Robinsons A ?03/26/2022 2:44 PM ? ?

## 2022-03-26 NOTE — ED Triage Notes (Signed)
Patient present to ED via pov, c/o chest pain and tightness in her chest for 2 days. Patient reports that she is having shortness of breath since last night.Pt states she is having difficulty getting air in her lungs with productive cough. She goes to dialysis three times a week,. Reports vomiting this morning. ?

## 2022-03-26 NOTE — ED Notes (Signed)
Attempted In and out cath, scant amount of urine returned < 10 cc of urine noted. ?

## 2022-03-26 NOTE — ED Provider Notes (Signed)
?[facility] ?Provider Note ? ? ?CSN: [id number] ?Arrival date & time: 03/26/22  1209 ? ?  ? ?History ? ?Chief Complaint  ?Patient presents with  ? Chest Pain  ? ? ?[name] is a 71 y.o. female. ? ? ?Chest Pain ? ? Pt has had 6 visits this month to the ED for variety of reasons - many for SOB - she has known CHF, last echo was 03/05/22, showed 55% EF, with new VF dysfunction, she had cath on 03/06/22, showed that she had severe two-vessel coronary disease with 80 to 90% in the mid LAD and 99% in the proximal RCA with some left-to-right collaterals noted.  Decision was made to hold off on stenting until cardiothoracic surgery consultation occurred.  Dr. Kipp Brood with the cardiothoracic surgery service saw the patient, and it was recognized that the patient had moderate to severe right ventricular dysfunction and though she would be possibly able to go under surgical revascularization the patient had decided that given her comorbidities and her heart dysfunction that PCI to the RCA and LAD were the best options, this occurred on March 08, 2022 ? ?She was seen again on the 13th in the emergency department because of bleeding fistulas, sutures were placed and she did well ? ?Seen the next day for anxiety, discharged ? ?Has been seen in her family doctor's office on the 19th, no acute interventions and comes back today because she is having shortness of breath. ? ?SOB, this has been present essentially since she was discharged from the hospital but seems to be worse over the last 24 hours.  She is having difficulty breathing feeling like she is not getting any air, she tries to take a deep breath but feels like nothing is going in.  She is not on home oxygen, she does take intermittent breathing treatments and last had a breathing treatment last night but has not had one this morning.  She does not have any swelling in her legs, she does not complain of chest pain, she has not had fevers or chills  however her shortness of breath has worsened. ? ? ? ?Home Medications ?Prior to Admission medications   ?Medication Sig Start Date End Date Taking? Authorizing Provider  ?acetaminophen (TYLENOL) 500 MG tablet Take 1,000 mg by mouth every 6 (six) hours as needed for moderate pain or headache.   Yes [provider]  ?albuterol (PROVENTIL) (2.5 MG/3ML) 0.083% nebulizer solution Take 3 mLs (2.5 mg total) by nebulization every 6 (six) hours as needed for wheezing or shortness of breath. 12/02/21  Yes [name], MD  ?albuterol (VENTOLIN HFA) 108 (90 Base) MCG/ACT inhaler Inhale 2 puffs into the lungs every 6 (six) hours as needed for wheezing or shortness of breath. 12/02/21  Yes [name], MD  ?apixaban (ELIQUIS) 5 MG TABS tablet Take 1 tablet (5 mg total) by mouth 2 (two) times daily. 03/10/22  Yes Pokhrel, Laxman, MD  ?atenolol (TENORMIN) 50 MG tablet Take 1 tablet (50 mg total) by mouth 2 (two) times daily. Skip on the morning of hemodialysis 03/10/22  Yes Pokhrel, Laxman, MD  ?budesonide-formoterol (SYMBICORT) 160-4.5 MCG/ACT inhaler Inhale 2 puffs into the lungs 2 (two) times daily. 11/15/21  Yes [name], MD  ?calcium acetate (PHOSLO) 667 MG capsule Take 667-1,334 mg by mouth See admin instructions. Take 1334 mg with meals 3 times daily and 667 mg with snacks   Yes [provider]  ?Carboxymethylcellul-Glycerin (LUBRICATING EYE DROPS OP) Place 1 drop  into both eyes daily as needed (dry eyes).   Yes [provider]  ?cetirizine (ZYRTEC) 10 MG tablet Take 1 tablet (10 mg total) by mouth daily as needed for allergies. 02/08/22  Yes [name], MD  ?diltiazem (CARDIZEM CD) 180 MG 24 hr capsule Take 1 capsule (180 mg total) by mouth every evening. 03/10/22 04/09/22 Yes Pokhrel, Laxman, MD  ?famotidine (PEPCID) 20 MG tablet Take 1 tablet (20 mg total) by mouth 2 (two) times daily. ?Patient taking differently: Take 20 mg by mouth at bedtime. 02/15/22  Yes Dettinger, Fransisca Kaufmann, MD   ?gabapentin (NEURONTIN) 400 MG capsule Take 2 capsules (800 mg total) by mouth See admin instructions. Take it after HD 03/22/22  Yes [name], MD  ?hydrOXYzine (ATARAX) 50 MG tablet Take 1 tablet (50 mg total) by mouth 3 (three) times daily as needed for anxiety. 03/22/22  Yes [name], MD  ?Insulin Glargine (BASAGLAR KWIKPEN) 100 UNIT/ML Inject 25 Units into the skin at bedtime. Add five units each time the fasting is over 125 three days in a row. 11/15/21  Yes [name], MD  ?nitroGLYCERIN (NITROSTAT) 0.4 MG SL tablet Place 1 tablet (0.4 mg total) under the tongue every 5 (five) minutes as needed for chest pain. 11/09/21  Yes Minus Breeding, MD  ?ondansetron (ZOFRAN) 4 MG tablet TAKE 1 TABLET BY MOUTH EVERY 8 HOURS AS NEEDED FOR NAUSEA AND VOMITING 11/10/21  Yes [name], MD  ?polyethylene glycol (MIRALAX / GLYCOLAX) 17 g packet Take 17 g by mouth daily as needed for moderate constipation or mild constipation. 03/10/22  Yes Pokhrel, Laxman, MD  ?QUEtiapine (SEROQUEL) 200 MG tablet Take 1 tablet (200 mg total) by mouth at bedtime. For sleep and for anxiety 03/17/22  Yes [name], [name], MD  ?rOPINIRole (REQUIP) 1 MG tablet Take 1 tablet (1 mg total) by mouth at bedtime. For leg cramps 09/15/21  Yes [name], MD  ?rosuvastatin (CRESTOR) 5 MG tablet Take 1 tablet (5 mg total) by mouth daily. For cholesterol 12/12/21  Yes [name], MD  ?Semaglutide,0.25 or 0.5MG /DOS, (OZEMPIC, 0.25 OR 0.5 MG/DOSE,) 2 MG/1.5ML SOPN Inject 0.5 mg into the skin every Friday.   Yes [provider]  ?ticagrelor (BRILINTA) 90 MG TABS tablet Take 1 tablet (90 mg total) by mouth 2 (two) times daily. 03/10/22 06/08/22 Yes Pokhrel, Laxman, MD  ?torsemide (DEMADEX) 20 MG tablet Take 20 mg by mouth daily. 01/19/22  Yes [provider]  ?Continuous Blood Gluc Receiver (FREESTYLE LIBRE 2 READER) DEVI USE TO TEST BLOOD SUGAR CONTINUOUSLY 11/15/21   [name], MD  ?Continuous Blood Gluc Sensor (FREESTYLE  LIBRE 2 SENSOR) MISC Use multiple times daily to track glucose to prevent highs and lows as a complication of dialysis Dx:Z99.2, E11.59 09/15/21   [name], MD  ?glucose blood (ONETOUCH ULTRA) test strip Use to test blood sugar 3 times daily as directed. DX: E11.9 09/13/20   [name], MD  ?omeprazole (PRILOSEC) 20 MG capsule Take 1 capsule (20 mg total) by mouth daily. ?Patient not taking: Reported on 03/26/2022 12/12/21   [name], MD  ?   ? ?Allergies    ?Azithromycin, Ace inhibitors, Clopidogrel bisulfate, Codeine, Lisinopril, and Penicillins   ? ?Review of Systems   ?Review of Systems  ?Cardiovascular:  Positive for chest pain.  ?All other systems reviewed and are negative. ? ?Physical Exam ?Updated Vital Signs ?BP (!) 160/91   Pulse (!) 40   Temp 97.7 ?F (36.5 ?C) (Oral)   Resp  20   Ht 1.6 m (5\' 3" )   Wt 85.3 kg   SpO2 98%   BMI 33.30 kg/m?  ?Physical Exam ?Vitals and nursing note reviewed.  ?Constitutional:   ?   General: She is not in acute distress. ?   Appearance: She is well-developed.  ?HENT:  ?   Head: Normocephalic and atraumatic.  ?   Mouth/Throat:  ?   Pharynx: No oropharyngeal exudate.  ?Eyes:  ?   General: No scleral icterus.    ?   Right eye: No discharge.     ?   Left eye: No discharge.  ?   Conjunctiva/sclera: Conjunctivae normal.  ?   Pupils: Pupils are equal, round, and reactive to light.  ?Neck:  ?   Thyroid: No thyromegaly.  ?   Vascular: No JVD.  ?Cardiovascular:  ?   Rate and Rhythm: Regular rhythm. Tachycardia present.  ?   Heart sounds: Normal heart sounds. No murmur heard. ?  No friction rub. No gallop.  ?Pulmonary:  ?   Effort: No respiratory distress.  ?   Breath sounds: Wheezing present. No rales.  ?   Comments: Occasional rhonchi, speaks in just shortened sentences ?Abdominal:  ?   General: Bowel sounds are normal. There is no distension.  ?   Palpations: Abdomen is soft. There is no mass.  ?   Tenderness: There is no abdominal tenderness.  ?Musculoskeletal:      ?   General: No tenderness. Normal range of motion.  ?   Cervical back: Normal range of motion and neck supple.  ?   Right lower leg: No edema.  ?   Left lower leg: No edema.  ?Lymphadenopathy:  ?   Cervical: No ce

## 2022-03-27 DIAGNOSIS — Z992 Dependence on renal dialysis: Secondary | ICD-10-CM | POA: Diagnosis not present

## 2022-03-27 DIAGNOSIS — N186 End stage renal disease: Secondary | ICD-10-CM

## 2022-03-27 DIAGNOSIS — I5033 Acute on chronic diastolic (congestive) heart failure: Secondary | ICD-10-CM | POA: Diagnosis not present

## 2022-03-27 DIAGNOSIS — I4891 Unspecified atrial fibrillation: Secondary | ICD-10-CM | POA: Diagnosis not present

## 2022-03-27 LAB — BASIC METABOLIC PANEL
Anion gap: 17 — ABNORMAL HIGH (ref 5–15)
BUN: 33 mg/dL — ABNORMAL HIGH (ref 8–23)
CO2: 21 mmol/L — ABNORMAL LOW (ref 22–32)
Calcium: 8.4 mg/dL — ABNORMAL LOW (ref 8.9–10.3)
Chloride: 92 mmol/L — ABNORMAL LOW (ref 98–111)
Creatinine, Ser: 8.02 mg/dL — ABNORMAL HIGH (ref 0.44–1.00)
GFR, Estimated: 5 mL/min — ABNORMAL LOW (ref >60)
Glucose, Bld: 210 mg/dL — ABNORMAL HIGH (ref 70–99)
Potassium: 4.3 mmol/L (ref 3.5–5.1)
Sodium: 130 mmol/L — ABNORMAL LOW (ref 135–145)

## 2022-03-27 LAB — CBC
HCT: 28.5 % — ABNORMAL LOW (ref 36.0–46.0)
Hemoglobin: 8.9 g/dL — ABNORMAL LOW (ref 12.0–15.0)
MCH: 28 pg (ref 26.0–34.0)
MCHC: 31.2 g/dL (ref 30.0–36.0)
MCV: 89.6 fL (ref 80.0–100.0)
Platelets: 223 10*3/uL (ref 150–400)
RBC: 3.18 MIL/uL — ABNORMAL LOW (ref 3.87–5.11)
RDW: 17.5 % — ABNORMAL HIGH (ref 11.5–15.5)
WBC: 14 10*3/uL — ABNORMAL HIGH (ref 4.0–10.5)
nRBC: 0 % (ref 0.0–0.2)

## 2022-03-27 LAB — GLUCOSE, CAPILLARY
Glucose-Capillary: 111 mg/dL — ABNORMAL HIGH (ref 70–99)
Glucose-Capillary: 170 mg/dL — ABNORMAL HIGH (ref 70–99)
Glucose-Capillary: 237 mg/dL — ABNORMAL HIGH (ref 70–99)
Glucose-Capillary: 359 mg/dL — ABNORMAL HIGH (ref 70–99)

## 2022-03-27 LAB — TROPONIN I (HIGH SENSITIVITY): Troponin I (High Sensitivity): 81 ng/L — ABNORMAL HIGH (ref <18)

## 2022-03-27 IMAGING — DX DG CHEST 1V PORT
1 series · 1 of 1 positions shown · non-contrast
Comparison: Chest radiograph 09/15/2020

CLINICAL DATA: Patient reports chest tightness, dizziness, nausea.

EXAM:
PORTABLE CHEST 1 VIEW

[chest ap]
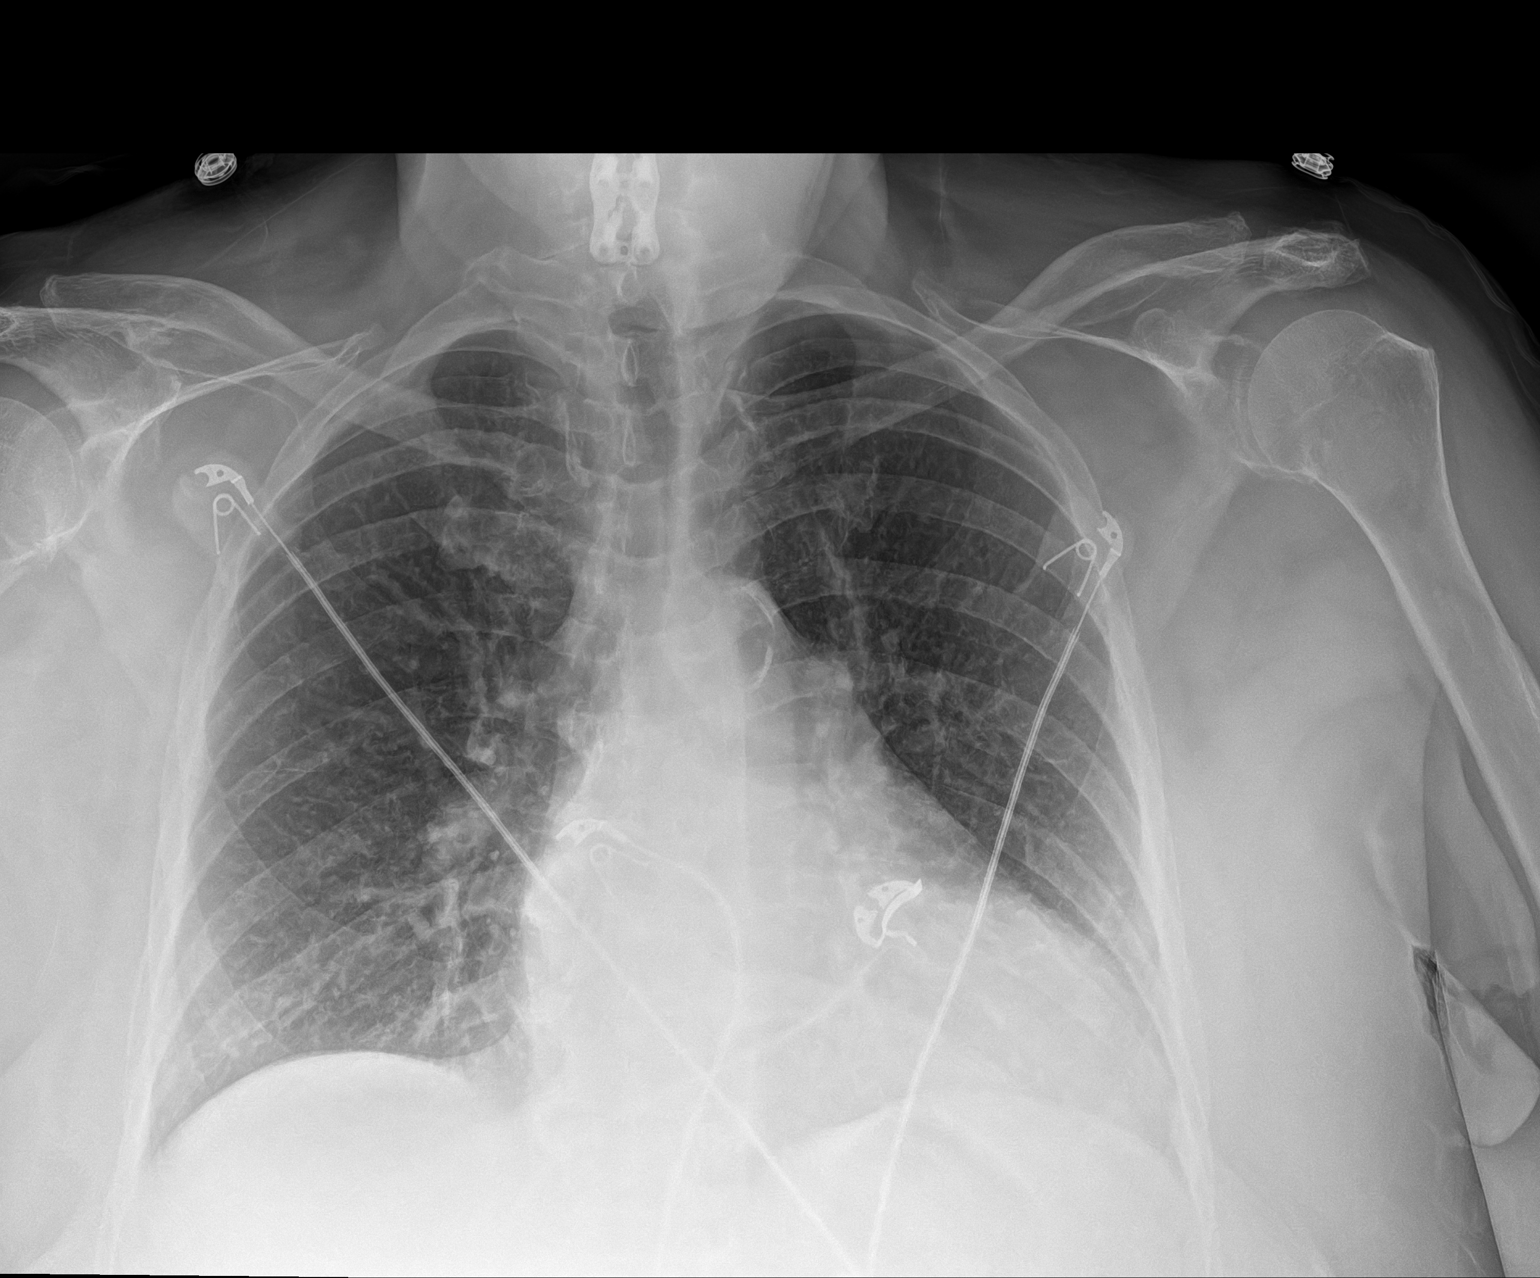

[1 of 1 positions shown; findings below may reference images not displayed]

FINDINGS: Stable cardiomediastinal contours with enlarged heart size. Aortic
arch calcification. The lungs are clear. No pneumothorax or large
pleural effusion. No acute finding in the visualized skeleton.
IMPRESSION: Cardiomegaly.  No acute finding.

## 2022-03-27 MED ORDER — LIDOCAINE HCL (PF) 1 % IJ SOLN: 5.0000 mL | INTRAMUSCULAR | Status: DC | PRN

## 2022-03-27 MED ORDER — SODIUM CHLORIDE 0.9 % IV SOLN: 100.0000 mL | INTRAVENOUS | Status: DC | PRN

## 2022-03-27 MED ORDER — DILTIAZEM HCL ER COATED BEADS 120 MG PO CP24
240.0000 mg | ORAL_CAPSULE | Freq: Every day | ORAL | Status: DC
  Administered 2022-03-27 – 2022-03-31 (×4): 240 mg via ORAL
  Filled 2022-03-27 (×3): qty 2
  Filled 2022-03-27: qty 1
  Filled 2022-03-27: qty 2

## 2022-03-27 MED ORDER — INSULIN GLARGINE-YFGN 100 UNIT/ML ~~LOC~~ SOLN
32.0000 [IU] | Freq: Every day | SUBCUTANEOUS | Status: DC
  Administered 2022-03-27 – 2022-03-30 (×4): 32 [IU] via SUBCUTANEOUS
  Filled 2022-03-27 (×5): qty 0.32

## 2022-03-27 MED ORDER — ALTEPLASE 2 MG IJ SOLR: 2.0000 mg | Freq: Once | INTRAMUSCULAR | Status: DC | PRN

## 2022-03-27 MED ORDER — FAMOTIDINE 20 MG PO TABS
20.0000 mg | ORAL_TABLET | Freq: Every day | ORAL | Status: DC
Start: 2022-03-28 — End: 2022-03-31
  Administered 2022-03-28 – 2022-03-30 (×3): 20 mg via ORAL
  Filled 2022-03-27 (×3): qty 1

## 2022-03-27 MED ORDER — HEPARIN SODIUM (PORCINE) 1000 UNIT/ML DIALYSIS: 20.0000 [IU]/kg | INTRAMUSCULAR | Status: DC | PRN

## 2022-03-27 MED ORDER — HEPARIN SODIUM (PORCINE) 1000 UNIT/ML DIALYSIS: 1000.0000 [IU] | INTRAMUSCULAR | Status: DC | PRN

## 2022-03-27 MED ORDER — IPRATROPIUM BROMIDE 0.02 % IN SOLN
RESPIRATORY_TRACT | Status: AC
  Administered 2022-03-27: 0.5 mg
  Filled 2022-03-27: qty 2.5

## 2022-03-27 MED ORDER — LIDOCAINE-PRILOCAINE 2.5-2.5 % EX CREA: 1.0000 "application " | TOPICAL_CREAM | CUTANEOUS | Status: DC | PRN

## 2022-03-27 MED ORDER — IPRATROPIUM-ALBUTEROL 0.5-2.5 (3) MG/3ML IN SOLN
3.0000 mL | Freq: Four times a day (QID) | RESPIRATORY_TRACT | Status: DC
  Administered 2022-03-27 – 2022-03-30 (×12): 3 mL via RESPIRATORY_TRACT
  Filled 2022-03-27 (×10): qty 3
  Filled 2022-03-27: qty 6

## 2022-03-27 MED ORDER — CHLORHEXIDINE GLUCONATE CLOTH 2 % EX PADS
6.0000 | MEDICATED_PAD | Freq: Every day | CUTANEOUS | Status: DC
  Administered 2022-03-27 – 2022-03-31 (×5): 6 via TOPICAL

## 2022-03-27 MED ORDER — PENTAFLUOROPROP-TETRAFLUOROETH EX AERO
1.0000 "application " | INHALATION_SPRAY | CUTANEOUS | Status: DC | PRN
  Filled 2022-03-27: qty 30

## 2022-03-27 MED ORDER — ALBUTEROL SULFATE (2.5 MG/3ML) 0.083% IN NEBU: 2.5000 mg | INHALATION_SOLUTION | RESPIRATORY_TRACT | Status: DC | PRN

## 2022-03-27 NOTE — Consult Note (Addendum)
?Cardiology Consultation:  ? ?Patient ID: [name] ?MRN: [id number]; DOB: 1951-05-16 ? ?Admit date: 03/26/2022 ?Date of Consult: 03/27/2022 ? ?PCP:  [name], MD ?  ?[facility] Providers ?Cardiologist:  Minus Breeding, MD      ? ? ?Patient Profile:  ? ?[name] is a 71 y.o. female with a hx of CAD, PAF who is being seen 03/27/2022 for the evaluation of A-fib with RVR at the request of Dr Denton Brick. ?History of Present Illness:  ? ?[name] is a 71 year old female patient with ESRD on HD, hypertension, DM 2 uncontrolled who was admitted 03/04/2022 with unstable angina.  She underwent atherectomy PTCA and stenting of severe stenosis of the mid LAD and mid RCA.  Plan was to treat her with Eliquis and Brilinta for 3 months then aspirin alone.  2D echo 03/05/2022 LVEF is 55%.  She had RV dysfunction moderate to severe pulmonary hypertension. ? ?Patient now comes in with A-fib with RVR, potassium six 3.2.  Also acute on chronic diastolic dysfunction with BNP up to 2276 treated with IV Lasix.  WBC 15.2. Patient says she first became short of breath, couldn't get air and then her heart started racing and she felt tight in chest. Says it was not like when she had NSTEMI. Watching diet closely-no appetite past few days, taking meds. Dry weight in dialysis went from 84 to 82 kg. She was given lasix but not urinating much anymore. Plan for dialysis sometime today. ? ? ?Past Medical History:  ?Diagnosis Date  ? Acute on chronic diastolic CHF (congestive heart failure) ([place]) 03/05/2015  ? Grade 2. EF 60-65%  ? Anemia   ? hx of  ? Anxiety   ? Arthritis   ? CAD (coronary artery disease)   ? s/p Stenting in 2005;  [place] 7/09: Vigorous LV function, 2-3+ MR, RI 40%, RI stent patent, proximal-mid RCA 40-50%;  Echo 7/09:  EF 55-65%, trivial MR;  Myoview 11/18/12: EF 66%, normal LV wall motion, mild to moderate anterior ischemia - reviewed by [facility] and felt to rep breast atten (low risk);  Echo 6/14: mild LVH, EF 55-65%, Gr 2  DD, PASP 44, trivial eff   ? Chronic kidney disease   ? CREATININE IS UP--GOING TO KIDNEY MD   ? Chronic neck pain   ? COPD (chronic obstructive pulmonary disease) ([place])   ? Degenerative joint disease   ? left shoulder  ? Diabetic neuropathy ([place])   ? Diverticulosis   ? Esophageal stricture   ? GERD (gastroesophageal reflux disease)   ? Hyperlipidemia   ? Hypertension   ? IDDM (insulin dependent diabetes mellitus)   ? Pericardial effusion 03/06/2015  ? Very small per echo ; pleural nodules seen on CT chest.  ? Peripheral vascular disease ([place])   ? Stroke [facility])   ? "mini- years ago"  ? Vitamin D deficiency   ? ? ?Past Surgical History:  ?Procedure Laterality Date  ? A/V FISTULAGRAM N/A 08/10/2017  ? Procedure: A/V Fistulagram - Right Arm;  Surgeon: Elam Dutch, MD;  Location: [place] CV LAB;  Service: Cardiovascular;  Laterality: N/A;  ? A/V FISTULAGRAM N/A 03/03/2020  ? Procedure: A/V FISTULAGRAM - Right Arm;  Surgeon: Marty Heck, MD;  Location: [place] CV LAB;  Service: Cardiovascular;  Laterality: N/A;  ? ABDOMINAL HYSTERECTOMY    ? AV FISTULA PLACEMENT Right 07/20/2015  ? Procedure: RADIAL CEPHALIC ARTERIOVENOUS FISTULA CREATION RIGHT ARM;  Surgeon: Angelia Mould, MD;  Location: [facility]  OR;  Service: Vascular;  Laterality: Right;  ? AV FISTULA PLACEMENT Right 11/26/2015  ? Procedure:  Creation of RIGHT BRACHIO-CEPHALIC arteriovenous fistula;  Surgeon: Angelia Mould, MD;  Location: [place];  Service: Vascular;  Laterality: Right;  ? BACK SURGERY    ? 2007 & 2013  ? BLADDER SURGERY    ? Tack  ? CARDIAC CATHETERIZATION  2003, 2005, 2009  ? 1 stent  ? cardiac stents    ? CATARACT EXTRACTION W/PHACO Right 05/04/2014  ? Procedure: CATARACT EXTRACTION PHACO AND INTRAOCULAR LENS PLACEMENT (IOC);  Surgeon: Williams Che, MD;  [facility]: [facility];  Service: Ophthalmology;  Laterality: Right;  CDE 1.47  ? CATARACT EXTRACTION W/PHACO Left 07/20/2014  ? Procedure: CATARACT EXTRACTION WITH PHACO AND  INTRAOCULAR LENS PLACEMENT LEFT EYE CDE=1.79;  Surgeon: Williams Che, MD;  Location: [facility];  Service: Ophthalmology;  Laterality: Left;  ? CERVICAL SPINE SURGERY  2012  ? 8 screws  ? CORONARY ATHERECTOMY N/A 03/08/2022  ? Procedure: CORONARY ATHERECTOMY;  Surgeon: Sherren Mocha, MD;  Location: [place] CV LAB;  Service: Cardiovascular;  Laterality: N/A;  ? CYSTOSCOPY WITH INJECTION N/A 06/03/2013  ? Procedure: MACROPLASTIQUE  INJECTION ;  Surgeon: Malka So, MD;  [facility];  Service: Urology;  Laterality: N/A;  ? FISTULOGRAM Right 10/20/2016  ? Procedure: FISTULOGRAM WITH POSSIBLE INTERVENTION;  Surgeon: [name], MD;  [facility]: [facility];  Service: Vascular;  Laterality: Right;  ? INSERTION OF DIALYSIS CATHETER N/A 11/26/2015  ? Procedure: INSERTION OF DIALYSIS CATHETER;  Surgeon: Angelia Mould, MD;  Location: [place];  Service: Vascular;  Laterality: N/A;  ? INTRAVASCULAR IMAGING/OCT N/A 03/08/2022  ? Procedure: INTRAVASCULAR IMAGING/OCT;  Surgeon: Sherren Mocha, MD;  Location: [place] CV LAB;  Service: Cardiovascular;  Laterality: N/A;  ? LAMINECTOMY  12/29/2011  ? LEFT HEART CATH AND CORONARY ANGIOGRAPHY N/A 03/06/2022  ? Procedure: LEFT HEART CATH AND CORONARY ANGIOGRAPHY;  Surgeon: Nelva Bush, MD;  Location: [place] CV LAB;  Service: Cardiovascular;  Laterality: N/A;  ? LUMBAR LAMINECTOMY/DECOMPRESSION MICRODISCECTOMY  12/29/2011  ? Procedure: LUMBAR LAMINECTOMY/DECOMPRESSION MICRODISCECTOMY;  Surgeon: Floyce Stakes, MD;  Location: [place] NEURO ORS;  Service: Neurosurgery;  Laterality: N/A;  Lumbar Two through Lumbar Five Laminectomies/Cellsaver  ? PARTIAL HYSTERECTOMY    ? PERIPHERAL VASCULAR BALLOON ANGIOPLASTY  03/03/2020  ? Procedure: PERIPHERAL VASCULAR BALLOON ANGIOPLASTY;  Surgeon: Marty Heck, MD;  Location: [place] CV LAB;  Service: Cardiovascular;;  Rt arm fistula  ? PERIPHERAL VASCULAR BALLOON ANGIOPLASTY Right 04/15/2021  ? Procedure: PERIPHERAL  VASCULAR BALLOON ANGIOPLASTY;  Surgeon: Angelia Mould, MD;  Location: [place] CV LAB;  Service: Cardiovascular;  Laterality: Right;  arm fistula  ? PERIPHERAL VASCULAR BALLOON ANGIOPLASTY  03/14/2022  ? Procedure: PERIPHERAL VASCULAR BALLOON ANGIOPLASTY;  Surgeon: Broadus John, MD;  Location: [place] CV LAB;  Service: Cardiovascular;;  right arm AVF  ? PERIPHERAL VASCULAR CATHETERIZATION N/A 11/22/2015  ? Procedure: Fistulagram;  Surgeon: Angelia Mould, MD;  Location: [place] CV LAB;  Service: Cardiovascular;  Laterality: N/A;  ? PERIPHERAL VASCULAR INTERVENTION  08/10/2017  ? Procedure: PERIPHERAL VASCULAR INTERVENTION;  Surgeon: Elam Dutch, MD;  Location: [place] CV LAB;  Service: Cardiovascular;;  ? SHOULDER SURGERY Right   ? Rotator cuff  ?  ? ?Home Medications:  ?Prior to Admission medications   ?Medication Sig Start Date End Date Taking? Authorizing Provider  ?acetaminophen (TYLENOL) 500 MG tablet Take 1,000 mg by mouth every 6 (  six) hours as needed for moderate pain or headache.   Yes [provider]  ?albuterol (PROVENTIL) (2.5 MG/3ML) 0.083% nebulizer solution Take 3 mLs (2.5 mg total) by nebulization every 6 (six) hours as needed for wheezing or shortness of breath. 12/02/21  Yes [name], MD  ?albuterol (VENTOLIN HFA) 108 (90 Base) MCG/ACT inhaler Inhale 2 puffs into the lungs every 6 (six) hours as needed for wheezing or shortness of breath. 12/02/21  Yes [name], MD  ?apixaban (ELIQUIS) 5 MG TABS tablet Take 1 tablet (5 mg total) by mouth 2 (two) times daily. 03/10/22  Yes Pokhrel, Laxman, MD  ?atenolol (TENORMIN) 50 MG tablet Take 1 tablet (50 mg total) by mouth 2 (two) times daily. Skip on the morning of hemodialysis 03/10/22  Yes Pokhrel, Laxman, MD  ?budesonide-formoterol (SYMBICORT) 160-4.5 MCG/ACT inhaler Inhale 2 puffs into the lungs 2 (two) times daily. 11/15/21  Yes [name], MD  ?calcium acetate (PHOSLO) 667 MG capsule Take  667-1,334 mg by mouth See admin instructions. Take 1334 mg with meals 3 times daily and 667 mg with snacks   Yes [provider]  ?Carboxymethylcellul-Glycerin (LUBRICATING EYE DROPS OP) Place 1 drop into bo

## 2022-03-27 NOTE — TOC Initial Note (Signed)
Transition of Care (TOC) - Initial/Assessment Note  ? ? ?Patient Details  ?Name: [name] ?MRN: [id number] ?Date of Birth: 1951-01-10 ? ?Transition of Care (TOC) CM/SW Contact:    ?Shade Flood, LCSW ?Phone Number: ?03/27/2022, 4:25 PM ? ?Clinical Narrative:                 ? ?Pt admitted from home. She has a high readmission risk score. Pt was recently discharged from [facility] after a 12 day stay. Pt went home with [facility] PT/OT.  ? ?Met with pt to assess. Patient states that she is from home where she lives with her mother . Patient does have PCP Dr. Livia Snellen at  [place] is [place]. Patient states that she does have transportation to appointments because she drives. Home DME consist of walker, wheelchair and cane but states that she doesn't use any of them.  ? ?Pt is on O2 here but not at home. She intends to return home with [facility] at dc. ? ?TOC will follow. ?  ? ?Expected Discharge Plan: [place] ?Barriers to Discharge: Continued Medical Work up ? ? ?Patient Goals and CMS Choice ?Patient states their goals for this hospitalization and ongoing recovery are:: go home ?CMS Medicare.gov Compare Post Acute Care list provided to:: Patient ?Choice offered to / list presented to : Patient ? ?Expected Discharge Plan and Services ?Expected Discharge Plan: [place] ?In-house Referral: Clinical Social Work ?  ?Post Acute Care Choice: Resumption of Svcs/PTA Provider ?Living arrangements for the past 2 months: [place] ?                ?  ?  ?  ?  ?  ?  ?  ?  ?  ?  ? ?Prior Living Arrangements/Services ?Living arrangements for the past 2 months: [place] ?Lives with:: Parents ?Patient language and need for interpreter reviewed:: Yes ?Do you feel safe going back to the place where you live?: Yes      ?Need for Family Participation in Patient Care: No (Comment) ?Care giver support system in place?: Yes (comment) ?Current home services: Home PT, Home  OT ?Criminal Activity/Legal Involvement Pertinent to Current Situation/Hospitalization: No - Comment as needed ? ?Activities of Daily Living ?  ?  ? ?Permission Sought/Granted ?  ?  ?   ?   ?   ?   ? ?Emotional Assessment ?Appearance:: Appears stated age ?Attitude/Demeanor/Rapport: Engaged ?Affect (typically observed): Pleasant ?Orientation: : Oriented to Self, Oriented to Place, Oriented to  Time, Oriented to Situation ?Alcohol / Substance Use: Not Applicable ?Psych Involvement: No (comment) ? ?Admission diagnosis:  Acute pulmonary edema ([place]) [J81.0] ?Respiratory distress [R06.03] ?ESRD (end stage renal disease) ([place]) [N18.6] ?Acute exacerbation of CHF (congestive heart failure) ([place]) [I50.9] ?Atrial fibrillation with rapid ventricular response ([place]) [I48.91] ?Atrial fibrillation with RVR ([place]) [I48.91] ?Patient Active Problem List  ? Diagnosis Date Noted  ? Acute exacerbation of Diastolic CHF (congestive heart failure) ([place]) 03/26/2022  ? Physical deconditioning 03/09/2022  ? Hyponatremia 03/09/2022  ? Pulmonary hypertension ([place]) 03/06/2022  ? Hypokalemia 03/05/2022  ? Atrial fibrillation with RVR ([place]) 03/04/2022  ? Change in bowel habit 12/16/2021  ? Colon cancer screening 12/16/2021  ? 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  ? Family history of colonic polyps 12/16/2021  ? Acute CHF (congestive heart failure) ([place]) 12/06/2021  ? Leukocytosis 12/06/2021  ? Elevated brain natriuretic peptide (BNP) level 12/06/2021  ? Elevated troponin  12/06/2021  ? GERD (gastroesophageal reflux disease) 12/06/2021  ? Nausea 08/26/2021  ? Lumbago of lumbar region with sciatica 07/21/2021  ? Arthropathy of lumbar facet joint 06/27/2021  ? Radiculopathy, lumbar region 05/05/2021  ? Chest pain 02/19/2021  ? Essential hypertension   ? Mixed hyperlipidemia   ? Type 2 diabetes mellitus with hyperglycemia (HCC)   ? Hypoalbuminemia   ? Bruit 01/05/2020  ? Educated about COVID-19 virus infection 01/05/2020  ? End stage renal disease on  dialysis due to type 2 diabetes mellitus ([place]) 07/21/2019  ? Lung nodule 05/01/2018  ? Prolonged Q-T interval on ECG 09/06/2017  ? Unstable angina ([place]) 09/06/2017  ? GAD (generalized anxiety disorder) 03/04/2016  ? Leg pain 01/07/2016  ? Anemia of chronic disease/ESRD 12/10/2015  ? Dependence on renal dialysis ([place]) 12/10/2015  ? ESRD (end stage renal disease) on dialysis ([place]) 12/10/2015  ? Osteopathy in diseases classified elsewhere, unspecified site 12/10/2015  ? Constipation 10/25/2015  ? COPD (chronic obstructive pulmonary disease) ([place]) 03/06/2015  ? Pericardial effusion 03/06/2015  ? Chronic diastolic CHF (congestive heart failure) ([place]) 03/05/2015  ? PAD (peripheral artery disease) ([place]) 04/21/2014  ? Dyspnea 09/29/2013  ? Obesity (BMI 30.0-34.9) 12/05/2011  ? Diverticulosis 03/29/2011  ? Diabetic neuropathy ([place]) 03/29/2011  ? Esophageal stricture 03/29/2011  ? Vitamin D deficiency 03/29/2011  ? Degenerative joint disease of left shoulder 03/29/2011  ? Neck pain 03/29/2011  ? Type II diabetes mellitus with nephropathy ([place]) 04/19/2009  ? Hyperlipidemia associated with type 2 diabetes mellitus ([place]) 04/19/2009  ? Hypertension associated with diabetes ([place]) 04/19/2009  ? Coronary atherosclerosis 04/19/2009  ? ?PCP:  [name], MD ?Pharmacy:   ?[facility] ?[place] [address] ?Phone: [phone] Fax: [phone] ? ? ? ? ?Social Determinants of Health (SDOH) Interventions ?  ? ?Readmission Risk Interventions ? ?  03/27/2022  ?  4:22 PM 03/08/2022  ?  1:58 PM  ?Readmission Risk Prevention Plan  ?Transportation Screening Complete Complete  ?Medication Review Press photographer) Complete Complete  ?PCP or Specialist appointment within 3-5 days of discharge  Complete  ?[place] or Home Care Consult Complete Complete  ?SW Recovery Care/Counseling Consult Complete Complete  ?Palliative Care Screening Not Applicable Not Applicable  ?Skilled Nursing  Facility Not Applicable Complete  ? ? ? ?

## 2022-03-27 NOTE — Progress Notes (Signed)
Inpatient Diabetes Program Recommendations ? ?AACE/ADA: New Consensus Statement on Inpatient Glycemic Control (2015) ? ?Target Ranges:  Prepandial:   less than 140 mg/dL ?     Peak postprandial:   less than 180 mg/dL (1-2 hours) ?     Critically ill patients:  140 - 180 mg/dL  ? ?Lab Results  ?Component Value Date  ? GLUCAP 359 (H) 03/27/2022  ? HGBA1C 8.0 (H) 03/06/2022  ? ? ?Review of Glycemic Control ? Latest Reference Range & Units 03/26/22 17:12 03/26/22 20:48 03/27/22 07:27 03/27/22 11:32  ?Glucose-Capillary 70 - 99 mg/dL 245 (H) 209 (H) 237 (H) 359 (H)  ? ?Diabetes history: DM 2 ?Outpatient Diabetes medications: Basaglar 35 units, Ozempic 0.5 mg QWeek ?Current orders for Inpatient glycemic control:  ?Semglee 25 units qhs ?Novolog 0-15 units tid + hs ?Novolog 3 units tid meal coverage ? ?Solumedrol 40 mg Q12 hours ? ?Inpatient Diabetes Program Recommendations:   ? ?-   Consider Increasing Semglee to 32 units ?-   Consider increasing Novolog meal coverage to 8 units tid meal coverage if eating >50% of meals ? ?Thanks, ? ?Tama Headings RN, MSN, BC-ADM ?Inpatient Diabetes Coordinator ?Team Pager [phone] (8a-5p) ? ? ? ?

## 2022-03-27 NOTE — Consult Note (Signed)
[facility] ?Renal Consultation Note  ?  ?Indication for Consultation:  Management of ESRD/hemodialysis; anemia, hypertension/volume and secondary hyperparathyroidism ? ?HPI: [name] is a 71 y.o. female with PMH including ESRD on dialysis, CHF, CAD(s/p PCI of LAD and RCA on 03/06/22), COPD, T2DM with neuopathy, PVD, HTN, CVA, and new onset atrial fibrillation (hospitalized 4/1-4/12/23) who presented to [facility] ED on 03/26/22 complaining of worsening SOB, DOE, and chest discomfort.  EMS were called and gave her a breathing treatment but declined to go to the hospital.  Later that night, she had worsening symptoms and had her daughter drive her to [facility] ED (she didn't want to go to [facility]).  In the ED, she was noted to have Atrial fibrillation with RVR.  CXR revealed mild pulmonary vascular congestion without frank pulmonary edema.  Labs were notable for BNP 2276, WBC 15.2, Hgb 9.8, Na 131, K 3.2.  She was given IV cardizem and admitted for further evaluation.  We were consulted to provide dialysis during her hospitalization.  ? ?Past Medical History:  ?Diagnosis Date  ? Acute on chronic diastolic CHF (congestive heart failure) ([place]) 03/05/2015  ? Grade 2. EF 60-65%  ? Anemia   ? hx of  ? Anxiety   ? Arthritis   ? CAD (coronary artery disease)   ? s/p Stenting in 2005;  [place] 7/09: Vigorous LV function, 2-3+ MR, RI 40%, RI stent patent, proximal-mid RCA 40-50%;  Echo 7/09:  EF 55-65%, trivial MR;  Myoview 11/18/12: EF 66%, normal LV wall motion, mild to moderate anterior ischemia - reviewed by [facility] and felt to rep breast atten (low risk);  Echo 6/14: mild LVH, EF 55-65%, Gr 2 DD, PASP 44, trivial eff   ? Chronic kidney disease   ? CREATININE IS UP--GOING TO KIDNEY MD   ? Chronic neck pain   ? COPD (chronic obstructive pulmonary disease) ([place])   ? Degenerative joint disease   ? left shoulder  ? Diabetic neuropathy ([place])   ? Diverticulosis   ? Esophageal stricture   ? GERD (gastroesophageal reflux disease)    ? Hyperlipidemia   ? Hypertension   ? IDDM (insulin dependent diabetes mellitus)   ? Pericardial effusion 03/06/2015  ? Very small per echo ; pleural nodules seen on CT chest.  ? Peripheral vascular disease ([place])   ? Stroke [facility])   ? "mini- years ago"  ? Vitamin D deficiency   ? ?Past Surgical History:  ?Procedure Laterality Date  ? A/V FISTULAGRAM N/A 08/10/2017  ? Procedure: A/V Fistulagram - Right Arm;  Surgeon: Elam Dutch, MD;  Location: [place] CV LAB;  Service: Cardiovascular;  Laterality: N/A;  ? A/V FISTULAGRAM N/A 03/03/2020  ? Procedure: A/V FISTULAGRAM - Right Arm;  Surgeon: Marty Heck, MD;  Location: [place] CV LAB;  Service: Cardiovascular;  Laterality: N/A;  ? ABDOMINAL HYSTERECTOMY    ? AV FISTULA PLACEMENT Right 07/20/2015  ? Procedure: RADIAL CEPHALIC ARTERIOVENOUS FISTULA CREATION RIGHT ARM;  Surgeon: Angelia Mould, MD;  Location: [place];  Service: Vascular;  Laterality: Right;  ? AV FISTULA PLACEMENT Right 11/26/2015  ? Procedure:  Creation of RIGHT BRACHIO-CEPHALIC arteriovenous fistula;  Surgeon: Angelia Mould, MD;  Location: [place];  Service: Vascular;  Laterality: Right;  ? BACK SURGERY    ? 2007 & 2013  ? BLADDER SURGERY    ? Tack  ? CARDIAC CATHETERIZATION  2003, 2005, 2009  ? 1 stent  ? cardiac stents    ?  CATARACT EXTRACTION W/PHACO Right 05/04/2014  ? Procedure: CATARACT EXTRACTION PHACO AND INTRAOCULAR LENS PLACEMENT (IOC);  Surgeon: Williams Che, MD;  Location: [facility];  Service: Ophthalmology;  Laterality: Right;  CDE 1.47  ? CATARACT EXTRACTION W/PHACO Left 07/20/2014  ? Procedure: CATARACT EXTRACTION WITH PHACO AND INTRAOCULAR LENS PLACEMENT LEFT EYE CDE=1.79;  Surgeon: Williams Che, MD;  Location: [facility];  Service: Ophthalmology;  Laterality: Left;  ? CERVICAL SPINE SURGERY  2012  ? 8 screws  ? CORONARY ATHERECTOMY N/A 03/08/2022  ? Procedure: CORONARY ATHERECTOMY;  Surgeon: Sherren Mocha, MD;  Location: [place] CV LAB;  Service:  Cardiovascular;  Laterality: N/A;  ? CYSTOSCOPY WITH INJECTION N/A 06/03/2013  ? Procedure: MACROPLASTIQUE  INJECTION ;  Surgeon: Malka So, MD;  Location: [facility];  Service: Urology;  Laterality: N/A;  ? FISTULOGRAM Right 10/20/2016  ? Procedure: FISTULOGRAM WITH POSSIBLE INTERVENTION;  Surgeon: [name], MD;  Location: [facility];  Service: Vascular;  Laterality: Right;  ? INSERTION OF DIALYSIS CATHETER N/A 11/26/2015  ? Procedure: INSERTION OF DIALYSIS CATHETER;  Surgeon: Angelia Mould, MD;  Location: [place];  Service: Vascular;  Laterality: N/A;  ? INTRAVASCULAR IMAGING/OCT N/A 03/08/2022  ? Procedure: INTRAVASCULAR IMAGING/OCT;  Surgeon: Sherren Mocha, MD;  Location: [place] CV LAB;  Service: Cardiovascular;  Laterality: N/A;  ? LAMINECTOMY  12/29/2011  ? LEFT HEART CATH AND CORONARY ANGIOGRAPHY N/A 03/06/2022  ? Procedure: LEFT HEART CATH AND CORONARY ANGIOGRAPHY;  Surgeon: Nelva Bush, MD;  Location: [place] [facility] LAB;  Service: Cardiovascular;  Laterality: N/A;  ? LUMBAR LAMINECTOMY/DECOMPRESSION MICRODISCECTOMY  12/29/2011  ? Procedure: LUMBAR LAMINECTOMY/DECOMPRESSION MICRODISCECTOMY;  Surgeon: Floyce Stakes, MD;  Location: [place] NEURO ORS;  Service: Neurosurgery;  Laterality: N/A;  Lumbar Two through Lumbar Five Laminectomies/Cellsaver  ? PARTIAL HYSTERECTOMY    ? PERIPHERAL VASCULAR BALLOON ANGIOPLASTY  03/03/2020  ? Procedure: PERIPHERAL VASCULAR BALLOON ANGIOPLASTY;  Surgeon: Marty Heck, MD;  Location: [place] CV LAB;  Service: Cardiovascular;;  Rt arm fistula  ? PERIPHERAL VASCULAR BALLOON ANGIOPLASTY Right 04/15/2021  ? Procedure: PERIPHERAL VASCULAR BALLOON ANGIOPLASTY;  Surgeon: Angelia Mould, MD;  Location: [place] [facility] LAB;  Service: Cardiovascular;  Laterality: Right;  arm fistula  ? PERIPHERAL VASCULAR BALLOON ANGIOPLASTY  03/14/2022  ? Procedure: PERIPHERAL VASCULAR BALLOON ANGIOPLASTY;  Surgeon: Broadus John, MD;  Location: [place] CV LAB;   Service: Cardiovascular;;  right arm AVF  ? PERIPHERAL VASCULAR CATHETERIZATION N/A 11/22/2015  ? Procedure: Fistulagram;  Surgeon: Angelia Mould, MD;  Location: [place] CV LAB;  Service: Cardiovascular;  Laterality: N/A;  ? PERIPHERAL VASCULAR INTERVENTION  08/10/2017  ? Procedure: PERIPHERAL VASCULAR INTERVENTION;  Surgeon: Elam Dutch, MD;  Location: [place] CV LAB;  Service: Cardiovascular;;  ? SHOULDER SURGERY Right   ? Rotator cuff  ? ?Family History:   ?Family History  ?Problem Relation Age of Onset  ? Heart disease Other   ? Hypertension Father   ? Cancer Father   ? Hypertension Mother   ? Hypertension Daughter   ? Pancreatitis Brother   ? Hypertension Daughter   ? Diabetes Daughter   ? ?Social History: ? reports that she quit smoking about 20 years ago. Her smoking use included cigarettes. She has a 30.00 pack-year smoking history. She has never used smokeless tobacco. She reports that she does not drink alcohol and does not use drugs. ?Allergies  ?Allergen Reactions  ? Azithromycin   ?  Prolonged QT  ?  Ace Inhibitors Cough  ? Clopidogrel Bisulfate Other (See Comments)  ?  Sick, Headache, "Felt terrible"  ? Codeine Other (See Comments)  ?  hallucinations  ? Lisinopril Cough  ? Penicillins Other (See Comments)  ?  Unknown reaction ?Did it involve swelling of the face/tongue/throat, SOB, or low BP? Unknown ?Did it involve sudden or severe rash/hives, skin peeling, or any reaction on the inside of your mouth or nose? Unknown ?Did you need to seek medical attention at a hospital or doctor's office? Unknown ?When did it last happen?      childhood allergy ?If all above answers are "NO", may proceed with cephalosporin use. ? ?  ? ?Prior to Admission medications   ?Medication Sig Start Date End Date Taking? Authorizing Provider  ?acetaminophen (TYLENOL) 500 MG tablet Take 1,000 mg by mouth every 6 (six) hours as needed for moderate pain or headache.   Yes [provider]  ?albuterol  (PROVENTIL) (2.5 MG/3ML) 0.083% nebulizer solution Take 3 mLs (2.5 mg total) by nebulization every 6 (six) hours as needed for wheezing or shortness of breath. 12/02/21  Yes [name], MD  ?albuterol (VENTOLIN HFA

## 2022-03-27 NOTE — Procedures (Signed)
Pt in HD suite, consent and orders verified, UF goal 3516mls including prime/rinse. L AV Fistula bruit/ thrill identified, lungs CTA diminished and use of accessory muscles, abdominal sounds present, pt with generalized edema.   ?  ?HD tx completed without complications. Net Uf removed 3000 mls. Baseline maintained; VS within normal limits, appears to be stable. Hemostasis achieved.  Pt maintained cough with wheezing on expiration. Reports SOB continues while on O2 [place] 2L. ?  ?

## 2022-03-27 NOTE — Progress Notes (Signed)
?PROGRESS NOTE ? ? ? ? ?[name], is a 71 y.o. female, DOB - 07-Jul-1951, KZS:[id number] ? ?Admit date - 03/26/2022   Admitting Physician Anavey Coombes Denton Brick, MD ? ?Outpatient Primary MD for the patient is [name], MD ? ?LOS - 1 ? ?Chief Complaint  ?Patient presents with  ? Chest Pain  ?    ? ? ?Brief Narrative:  ?71 y.o. female with past medical history relevant for obesity, HTN, DM2, ESRD on hemodialysis on TTS schedule, COPD, recent diagnosis of paroxysmal A-fib in April 2023, CAD with recent angioplasty and stent placement on 03/08/2022, GERD, and chronic anemia of ESRD admitted on 03/26/2022 with acute on chronic diastolic CHF exacerbation ? ?  ?-Assessment and Plan: ?1)PAFib with RVR--first diagnosed earlier this month ?--Treated with IV Cardizem ?- cardiology recommends Cardizem CD 240 mg daily ?-Continue PTA Eliquis and PTA atenolol ?-Echo from 03/05/2022 with EF of 55% with severe pulmonary hypertension and mildly dilated right and left atrium ?  ?2)HFpEF--- acute on chronic diastolic dysfunction exacerbation in the setting of #1 above ?-Chest x-ray consistent with pulmonary congestion ?-BNP 2276 up from 1600 recently ?-Despite IV Lasix no urine output ?-Consider extra hemodialysis session to address volume status ?-Recent echo as above #1 ?-Defer decision on HD to nephrology team ?  ?3)CAD/Recent NSTEMI--- troponin 77, down from 181 about 3 weeks ago ?-LHC on 03/06/2022 consistent with severe two-vessel disease involving RCA and LAD ?-Status post coronary atherectomy/angioplasty PCI on 03/08/2022 ?-Deemed to be too high risk a candidate for CABG ?-Continue Brilinta, Crestor and atenolol ?-Per cardiology's plan is to continue Eliquis and Brilinta for 90 days from 03/08/2022 ?  ?4)Leukocytosis---  ?-- patient received vancomycin and cefepime in the ED ?-Chest x-ray not suggestive of pneumonia ?-Cultures obtained ?-WBC trending down ?  ?5)Depression/anxiety--continue Seroquel 200 mg nightly along with hydroxyzine  as needed anxiety ?  ?6)ESRD--TTS, last HD 03/25/2022 completed 4 hours of HD ?-Metabolic Bone Disease: Continue PhosLo for hyperphosphatemia. ?-Nephrology consult appreciated ?Patient has exacerbation of CHF/volume overload may need extra hemodialysis session to address volume status ?  ?7)DM2-A1c 8.0 reflecting uncontrolled diabetes PTA ?-Increase Lantus to 32 units daily ?-Anticipate worsening hyperglycemia with steroids ?Use Novolog/Humalog Sliding scale insulin with Accu-Cheks/Fingersticks as ordered  ?  ?8)HTN-----  continue atenolol 50 mg daily, and p.o. Cardizem CD 240 mg daily-May use IV hydralazine as needed elevated BP ?  ?9)COPD with pulmonary hypertension--patient with significant dyspnea, cough and slight wheezing ?-Continue Solu-Medrol along with  mucolytics ?-Be judicious with bronchodilators in the setting of A-fib with RVR ?  ?10) chronic anemia of ESRD---- ?Hgb stable around 9  ?monitor closely especially while on Brilinta and Eliquis ?-Defer decision on ESA/Procrit to nephrology team ?  ?11) hypokalemia/hyponatremia --- magnesium WNL,  ?--Potassium normalized after replacement ?  ?12) chronic anticoagulation--- given angioplasty and stenting on 03/08/2022 in the setting of A-fib patient was advised to take Eliquis and Brilinta for 3 months through July 2023 and then transition most likely to aspirin ?  ?Disposition/Need for in-Hospital Stay- patient unable to be discharged at this time due to -  ?-Patient remains very symptomatic from cardiopulmonary standpoint--- anticipated will need addition of scheduled hemodialysis session to address volume status and respiratory situation ?  ?Status is: Inpatient ?  ?Remains inpatient appropriate because:  ?  ?Dispo: The patient is from: Home ?             Anticipated d/c is to: Home ?  Anticipated d/c date is: 2 days ?             Patient currently is not medically stable to d/c. ?Barriers: Not Clinically Stable-  ? ?Code Status :  -  Code Status:  Full Code  ? ?Family Communication:    (patient is alert, awake and coherent)  ? ?DVT Prophylaxis  :   - SCDs  SCDs Start: 03/26/22 1621 ?Place TED hose Start: 03/26/22 1621 ?apixaban (ELIQUIS) tablet 5 mg  ? ?Lab Results  ?Component Value Date  ? PLT 223 03/27/2022  ? ? ?Inpatient Medications ? ?Scheduled Meds: ? apixaban  5 mg Oral BID  ? atenolol  50 mg Oral BID  ? calcium acetate  1,334 mg Oral TID WC  ? Chlorhexidine Gluconate Cloth  6 each Topical Daily  ? Chlorhexidine Gluconate Cloth  6 each Topical Q0600  ? dextromethorphan-guaiFENesin  1 tablet Oral BID  ? diltiazem  240 mg Oral Daily  ? famotidine  20 mg Oral BID  ? furosemide  80 mg Intravenous Q12H  ? gabapentin  800 mg Oral Q M,W,F-1800  ? insulin aspart  0-15 Units Subcutaneous TID WC  ? insulin aspart  0-5 Units Subcutaneous QHS  ? insulin aspart  3 Units Subcutaneous TID WC  ? insulin glargine-yfgn  25 Units Subcutaneous QHS  ? ipratropium-albuterol  3 mL Nebulization Q6H  ? methylPREDNISolone (SOLU-MEDROL) injection  40 mg Intravenous Q12H  ? mometasone-formoterol  2 puff Inhalation BID  ? QUEtiapine  200 mg Oral QHS  ? rOPINIRole  1 mg Oral QHS  ? rosuvastatin  5 mg Oral Daily  ? sodium chloride flush  3 mL Intravenous Q12H  ? sodium chloride flush  3 mL Intravenous Q12H  ? ticagrelor  90 mg Oral BID  ? ?Continuous Infusions: ? sodium chloride    ? diltiazem (CARDIZEM) infusion 5 mg/hr (03/27/22 4287)  ? ?PRN Meds:.sodium chloride, acetaminophen **OR** acetaminophen, albuterol, bisacodyl, calcium acetate **AND** calcium acetate, hydrALAZINE, hydrOXYzine, ondansetron **OR** ondansetron (ZOFRAN) IV, polyethylene glycol, sodium chloride flush ? ? ?Anti-infectives (From admission, onward)  ? ? Start     Dose/Rate Route Frequency Ordered Stop  ? 03/26/22 1515  vancomycin (VANCOREADY) IVPB 1500 mg/300 mL       ? 1,500 mg ?150 mL/hr over 120 Minutes Intravenous  Once 03/26/22 1441 03/26/22 1646  ? 03/26/22 1500  ceFEPIme (MAXIPIME) 1 g in sodium chloride  0.9 % 100 mL IVPB       ? 1 g ?200 mL/hr over 30 Minutes Intravenous  Once 03/26/22 1434 03/26/22 1907  ? ?  ? ? ?Subjective: ?[name] today has no fevers, no emesis,  No chest pain,  -Cough, wheezing, conversational dyspnea patient is ?-No urine output ? ?Objective: ?Vitals:  ? 03/27/22 0500 03/27/22 0600 03/27/22 0650 03/27/22 0700  ?BP: (!) 141/71 (!) 177/78 (!) 147/79   ?Pulse: 94 90 (!) 110   ?Resp: 15 16 (!) 22   ?Temp:    98.1 ?F (36.7 ?C)  ?TempSrc:    Axillary  ?SpO2: 100% (!) 88% 100%   ?Weight: 84.6 kg     ?Height:      ? ? ?Intake/Output Summary (Last 24 hours) at 03/27/2022 1333 ?Last data filed at 03/27/2022 6811 ?Gross per 24 hour  ?Intake 378.12 ml  ?Output --  ?Net 378.12 ml  ? ?Filed Weights  ? 03/26/22 1408 03/26/22 2142 03/27/22 0500  ?Weight: 85.3 kg 84.1 kg 84.6 kg  ? ?Physical Exam ? ?Gen:- Awake  Alert, some conversational dyspnea, ?HEENT:- [place].AT, No sclera icterus ?Neck-Supple Neck, ?Lungs-improving air movement , faint bibasilar rales ?CV- S1, S2 normal, irregularly irregular and tachycardic ?Abd-  +ve B.Sounds, Abd Soft, No tenderness,    ?Extremity/Skin:- trace  edema, pedal pulses present  ?Psych-affect is appropriate, oriented x3 ?Neuro-no new focal deficits, no tremors ?MSK-right upper extremity AV fistula with thrill and bruit ? ?Data Reviewed: I have personally reviewed following labs and imaging studies ? ?CBC: ?Recent Labs  ?Lab 03/26/22 ?1407 03/27/22 ?7322  ?WBC 15.2* 14.0*  ?HGB 9.8* 8.9*  ?HCT 30.8* 28.5*  ?MCV 90.3 89.6  ?PLT 240 223  ? ?Basic Metabolic Panel: ?Recent Labs  ?Lab 03/26/22 ?1407 03/27/22 ?5672  ?NA 131* 130*  ?K 3.2* 4.3  ?CL 90* 92*  ?CO2 27 21*  ?GLUCOSE 168* 210*  ?BUN 22 33*  ?CREATININE 6.72* 8.02*  ?CALCIUM 8.6* 8.4*  ?MG 1.9  --   ? ?GFR: ?Estimated Creatinine Clearance: 6.8 mL/min (A) (by C-G formula based on SCr of 8.02 mg/dL (H)). ?Liver Function Tests: ?Recent Labs  ?Lab 03/26/22 ?1407  ?AST 20  ?ALT 18  ?ALKPHOS 68  ?BILITOT 0.6  ?PROT 7.1   ?ALBUMIN 3.9  ? ?Cardiac Enzymes: ?No results for input(s): CKTOTAL, CKMB, CKMBINDEX, TROPONINI in the last 168 hours. ?BNP (last 3 results) ?No results for input(s): PROBNP in the last 8760 hours. ?HbA1C:

## 2022-03-28 ENCOUNTER — Inpatient Hospital Stay (HOSPITAL_COMMUNITY): Payer: Medicare Other

## 2022-03-28 ENCOUNTER — Telehealth: Payer: Self-pay | Admitting: *Deleted

## 2022-03-28 DIAGNOSIS — E1159 Type 2 diabetes mellitus with other circulatory complications: Secondary | ICD-10-CM

## 2022-03-28 DIAGNOSIS — I152 Hypertension secondary to endocrine disorders: Secondary | ICD-10-CM

## 2022-03-28 DIAGNOSIS — E1122 Type 2 diabetes mellitus with diabetic chronic kidney disease: Secondary | ICD-10-CM | POA: Diagnosis not present

## 2022-03-28 DIAGNOSIS — I5033 Acute on chronic diastolic (congestive) heart failure: Secondary | ICD-10-CM | POA: Diagnosis not present

## 2022-03-28 DIAGNOSIS — T82838A Hemorrhage of vascular prosthetic devices, implants and grafts, initial encounter: Secondary | ICD-10-CM

## 2022-03-28 DIAGNOSIS — N185 Chronic kidney disease, stage 5: Secondary | ICD-10-CM

## 2022-03-28 DIAGNOSIS — I472 Ventricular tachycardia, unspecified: Secondary | ICD-10-CM

## 2022-03-28 DIAGNOSIS — I272 Pulmonary hypertension, unspecified: Secondary | ICD-10-CM

## 2022-03-28 DIAGNOSIS — I4891 Unspecified atrial fibrillation: Secondary | ICD-10-CM | POA: Diagnosis not present

## 2022-03-28 LAB — MAGNESIUM: Magnesium: 2.2 mg/dL (ref 1.7–2.4)

## 2022-03-28 LAB — RENAL FUNCTION PANEL
Albumin: 3.7 g/dL (ref 3.5–5.0)
Anion gap: 14 (ref 5–15)
BUN: 35 mg/dL — ABNORMAL HIGH (ref 8–23)
CO2: 25 mmol/L (ref 22–32)
Calcium: 9.3 mg/dL (ref 8.9–10.3)
Chloride: 92 mmol/L — ABNORMAL LOW (ref 98–111)
Creatinine, Ser: 5.96 mg/dL — ABNORMAL HIGH (ref 0.44–1.00)
GFR, Estimated: 7 mL/min — ABNORMAL LOW (ref >60)
Glucose, Bld: 350 mg/dL — ABNORMAL HIGH (ref 70–99)
Phosphorus: 5 mg/dL — ABNORMAL HIGH (ref 2.5–4.6)
Potassium: 4.7 mmol/L (ref 3.5–5.1)
Sodium: 131 mmol/L — ABNORMAL LOW (ref 135–145)

## 2022-03-28 LAB — GLUCOSE, CAPILLARY
Glucose-Capillary: 279 mg/dL — ABNORMAL HIGH (ref 70–99)
Glucose-Capillary: 407 mg/dL — ABNORMAL HIGH (ref 70–99)
Glucose-Capillary: 236 mg/dL — ABNORMAL HIGH (ref 70–99)
Glucose-Capillary: 154 mg/dL — ABNORMAL HIGH (ref 70–99)

## 2022-03-28 LAB — CBC
HCT: 27.8 % — ABNORMAL LOW (ref 36.0–46.0)
Hemoglobin: 9.2 g/dL — ABNORMAL LOW (ref 12.0–15.0)
MCH: 28.8 pg (ref 26.0–34.0)
MCHC: 33.1 g/dL (ref 30.0–36.0)
MCV: 86.9 fL (ref 80.0–100.0)
Platelets: 276 10*3/uL (ref 150–400)
RBC: 3.2 MIL/uL — ABNORMAL LOW (ref 3.87–5.11)
RDW: 17.9 % — ABNORMAL HIGH (ref 11.5–15.5)
WBC: 20.9 10*3/uL — ABNORMAL HIGH (ref 4.0–10.5)
nRBC: 0.9 % — ABNORMAL HIGH (ref 0.0–0.2)

## 2022-03-28 LAB — HIV ANTIBODY (ROUTINE TESTING W REFLEX): HIV Screen 4th Generation wRfx: NONREACTIVE

## 2022-03-28 LAB — HEPATITIS B SURFACE ANTIGEN: Hepatitis B Surface Ag: NONREACTIVE

## 2022-03-28 LAB — HEPATITIS B SURFACE ANTIBODY,QUALITATIVE: Hep B S Ab: REACTIVE — AB

## 2022-03-28 MED ORDER — TECHNETIUM TO 99M ALBUMIN AGGREGATED
4.0000 | Freq: Once | INTRAVENOUS | Status: AC | PRN
  Administered 2022-03-28: 4.4 via INTRAVENOUS

## 2022-03-28 MED ORDER — ATENOLOL 25 MG PO TABS
25.0000 mg | ORAL_TABLET | Freq: Every day | ORAL | Status: DC
  Administered 2022-03-28: 25 mg via ORAL
  Filled 2022-03-28: qty 1

## 2022-03-28 MED ORDER — GUAIFENESIN 100 MG/5ML PO LIQD
5.0000 mL | Freq: Four times a day (QID) | ORAL | Status: DC | PRN
  Administered 2022-03-28: 5 mL via ORAL
  Filled 2022-03-28: qty 5

## 2022-03-28 MED ORDER — ATENOLOL 25 MG PO TABS
75.0000 mg | ORAL_TABLET | Freq: Every day | ORAL | Status: DC
  Administered 2022-03-28 – 2022-03-29 (×2): 75 mg via ORAL
  Filled 2022-03-28 (×2): qty 3

## 2022-03-28 MED ORDER — ATENOLOL 25 MG PO TABS: 50.0000 mg | ORAL_TABLET | Freq: Every day | ORAL | Status: DC

## 2022-03-28 MED ORDER — ATENOLOL 25 MG PO TABS: 75.0000 mg | ORAL_TABLET | Freq: Two times a day (BID) | ORAL | Status: DC

## 2022-03-28 NOTE — Consult Note (Signed)
? ?[facility] [place] ? ?Patient name: [name] MRN: [id number] DOB: [birth year] Sex: female ? ?Reason for consult: Persistent bleeding right upper arm AV fistula ? ?HPI: ?[name] is a 71 y.o. female seen in consultation for persistent bleeding and right upper arm AV fistula.  This fistula was placed approximately 6 years ago and she has had outstanding use with this.  She recently had acute coronary event and underwent coronary stenting.  She was felt to be too high risk for surgical treatment.  She was initiated on Brilinta and Eliquis and has had bleeding difficulty since this time after hemodialysis.  There is no prior history of erosion of her graft.  She did undergo fistulogram in [facility] with Dr. Virl Cagey on 03/14/2022.  This was due to the prolonged bleeding.  She had no evidence of problems with the arteriovenous anastomosis or the cephalic vein itself.  There was a stenosis at the junction of the cephalic vein in the subclavian vein and this was treated successfully with balloon angioplasty. ? ?Current Facility-Administered Medications  ?Medication Dose Route Frequency Provider Last Rate Last Admin  ? 0.9 %  sodium chloride infusion  250 mL Intravenous PRN Emokpae, Courage, MD      ? 0.9 %  sodium chloride infusion  100 mL Intravenous PRN Donato Heinz, MD      ? 0.9 %  sodium chloride infusion  100 mL Intravenous PRN Donato Heinz, MD      ? acetaminophen (TYLENOL) tablet 650 mg  650 mg Oral Q6H PRN Roxan Hockey, MD   650 mg at 03/26/22 2142  ? Or  ? acetaminophen (TYLENOL) suppository 650 mg  650 mg Rectal Q6H PRN Emokpae, Courage, MD      ? albuterol (PROVENTIL) (2.5 MG/3ML) 0.083% nebulizer solution 2.5 mg  2.5 mg Nebulization Q4H PRN Emokpae, Courage, MD      ? alteplase (CATHFLO ACTIVASE) injection 2 mg  2 mg Intracatheter Once PRN Donato Heinz, MD      ? apixaban Arne Cleveland) tablet 5 mg  5 mg Oral BID Roxan Hockey, MD   5 mg at 03/27/22 2334  ? atenolol (TENORMIN) tablet 25 mg  25 mg Oral Daily Turner, Traci R, MD      ? atenolol (TENORMIN) tablet 75 mg  75 mg Oral BID Sueanne Margarita, MD      ? bisacodyl (DULCOLAX) suppository 10 mg  10 mg Rectal Daily PRN Emokpae, Courage, MD      ? calcium acetate (PHOSLO) capsule 1,334 mg  1,334 mg Oral TID WC Denton Brick, Courage, MD   1,334 mg at 03/27/22 0957  ? And  ? calcium acetate (PHOSLO) capsule 667 mg  667 mg Oral TID BM PRN Emokpae, Courage, MD      ? Chlorhexidine Gluconate Cloth 2 % PADS 6 each  6 each Topical Daily Adefeso, Oladapo, DO   6 each at 03/28/22 0903  ? Chlorhexidine Gluconate Cloth 2 % PADS 6 each  6 each Topical Q0600 Donato Heinz, MD   6 each at 03/28/22 [phone]  ? dextromethorphan-guaiFENesin (MUCINEX DM) 30-600 MG per 12 hr tablet 1 tablet  1 tablet Oral BID Opyd, Ilene Qua, MD   1 tablet at 03/28/22 0900  ? diltiazem (CARDIZEM CD) 24 hr capsule 240 mg  240 mg Oral Daily Chandrasekhar, Mahesh A, MD   240 mg at 03/28/22 0900  ? diltiazem (CARDIZEM) 125 mg in dextrose 5% 125 mL (1 mg/mL) infusion  5-15 mg/hr  Intravenous Continuous Roxan Hockey, MD   Stopped at 03/27/22 1721  ? famotidine (PEPCID) tablet 20 mg  20 mg Oral QHS Emokpae, Courage, MD      ? gabapentin (NEURONTIN) capsule 800 mg  800 mg Oral Q M,W,F-1800 Emokpae, Courage, MD   800 mg at 03/27/22 2340  ? guaiFENesin (ROBITUSSIN) 100 MG/5ML liquid 5 mL  5 mL Oral Q6H PRN Opyd, Ilene Qua, MD   5 mL at 03/28/22 0244  ? heparin injection 1,000 Units  1,000 Units Dialysis PRN Donato Heinz, MD      ? heparin injection 1,700 Units  20 Units/kg Dialysis PRN Donato Heinz, MD      ? hydrALAZINE (APRESOLINE) injection 10 mg  10 mg Intravenous Q6H PRN Emokpae, Courage, MD      ? hydrOXYzine (ATARAX) tablet 50 mg  50 mg Oral TID PRN Roxan Hockey, MD   50 mg at 03/27/22 1648  ? insulin aspart (novoLOG) injection 0-15 Units  0-15 Units Subcutaneous TID WC Roxan Hockey, MD   8 Units at  03/28/22 0859  ? insulin aspart (novoLOG) injection 0-5 Units  0-5 Units Subcutaneous QHS Roxan Hockey, MD   2 Units at 03/26/22 2149  ? insulin aspart (novoLOG) injection 3 Units  3 Units Subcutaneous TID WC Roxan Hockey, MD   3 Units at 03/28/22 0902  ? insulin glargine-yfgn (SEMGLEE) injection 32 Units  32 Units Subcutaneous QHS Roxan Hockey, MD   32 Units at 03/27/22 2354  ? ipratropium-albuterol (DUONEB) 0.5-2.5 (3) MG/3ML nebulizer solution 3 mL  3 mL Nebulization Q6H Emokpae, Courage, MD   3 mL at 03/28/22 0816  ? lidocaine (PF) (XYLOCAINE) 1 % injection 5 mL  5 mL Intradermal PRN Donato Heinz, MD      ? lidocaine-prilocaine (EMLA) cream 1 application.  1 application. Topical PRN Donato Heinz, MD      ? mometasone-formoterol [facility]) 200-5 MCG/ACT inhaler 2 puff  2 puff Inhalation BID Roxan Hockey, MD   2 puff at 03/28/22 0817  ? ondansetron (ZOFRAN) tablet 4 mg  4 mg Oral Q6H PRN Emokpae, Courage, MD      ? Or  ? ondansetron (ZOFRAN) injection 4 mg  4 mg Intravenous Q6H PRN Denton Brick, Courage, MD   4 mg at 03/28/22 0058  ? pentafluoroprop-tetrafluoroeth (GEBAUERS) aerosol 1 application.  1 application. Topical PRN Donato Heinz, MD      ? polyethylene glycol (MIRALAX / GLYCOLAX) packet 17 g  17 g Oral Daily PRN Emokpae, Courage, MD      ? QUEtiapine (SEROQUEL) tablet 200 mg  200 mg Oral QHS Emokpae, Courage, MD   200 mg at 03/27/22 2334  ? rOPINIRole (REQUIP) tablet 1 mg  1 mg Oral QHS Emokpae, Courage, MD   1 mg at 03/27/22 2334  ? rosuvastatin (CRESTOR) tablet 5 mg  5 mg Oral Daily Emokpae, Courage, MD   5 mg at 03/28/22 0859  ? sodium chloride flush (NS) 0.9 % injection 3 mL  3 mL Intravenous Q12H Roxan Hockey, MD   3 mL at 03/28/22 0903  ? sodium chloride flush (NS) 0.9 % injection 3 mL  3 mL Intravenous Q12H Emokpae, Courage, MD   3 mL at 03/28/22 0902  ? sodium chloride flush (NS) 0.9 % injection 3 mL  3 mL Intravenous PRN Emokpae, Courage, MD      ? ticagrelor  (BRILINTA) tablet 90 mg  90 mg Oral BID Roxan Hockey, MD   90 mg at 03/27/22 2334  ? ? ? ?  PHYSICAL EXAM: ?Vitals:  ? 03/27/22 2328 03/28/22 0439 03/28/22 0500 03/28/22 0817  ?BP: (!) 148/66 128/80    ?Pulse: 98 91    ?Resp: 19 18    ?Temp: 98.1 ?F (36.7 ?C) 98.3 ?F (36.8 ?C)    ?TempSrc:  Oral    ?SpO2: 96% 99%  98%  ?Weight: 81.4 kg  81.1 kg   ?Height:      ? ? ?GENERAL: The patient is a well-nourished female, in no acute distress. The vital signs are documented above. ?I removed the gauze dressing from her right upper arm.  She did have a slight amount of blood on the dressing.  There was 1 access area that had a very slow amount of bleeding.  There was no evidence of traumatic puncture.  No evidence of hematoma or false aneurysm.  There is no evidence of skin breakdown or threatened major bleed. ? ?MEDICAL ISSUES: ?No evidence of mechanical difficulty with her right upper arm AV fistula or evidence of a traumatic stick.  She simply has prolonged bleeding time related to the Brilinta and is also anticoagulated with the Eliquis.  I discussed this at length with the patient and also with Dr. Joesph Fillers.  I feel that she will have continued ongoing issues with prolonged bleeding times until she is able to discontinue the Brilinta.  If this is unmanageable, an option would be placement of tunneled hemodialysis catheter until she is able to discontinue the Brilinta but obviously would prefer to avoid catheter if possible.  We will not follow actively.  Please notify us if we can assist further ? ? ?Rosetta Posner, MD FACS ?[facility] [place] ?Office Tel [phone] ? ?Note: Portions of this report may have been transcribed using voice recognition software.  Every effort has been made to ensure accuracy; however, inadvertent computerized transcription errors may still be present.  ? ?

## 2022-03-28 NOTE — Progress Notes (Signed)
Overnight pulse ox setup for o2 monitoring. Pt is on room air spo2 94% hr 82. Nurse informed that study is running and patient aware that RT will come in around 6am to remove ?

## 2022-03-28 NOTE — Progress Notes (Signed)
Patient was seen for bleeding AV fistula.  ? ?She has a fib on Eliquis, recent (03/08/22) drug-eluting stents on Brilinta, and has been struggling with needle hole bleeding from her RUE AVF. She had prolonged bleeding on 4/8, fistulogram 4/11, and was seen in ED with prolonged bleeding on 4/13 where she required sutures and held Eliquis 2 days.  ? ?She had bleeding since last she was accessed last night but fortunately, appears to have slowed now. Discussed with patient and RN, recommend keep arm elevated and reassess prior to am dose Eliquis.  ? ? ?

## 2022-03-28 NOTE — Telephone Encounter (Signed)
CALLED PHARMACY AND CLARIFICATION GIVEN TO PHARMACIST ?

## 2022-03-28 NOTE — Consult Note (Signed)
[facility] CM Inpatient Consult ? ? ?03/28/2022 ? ?Cooper Render [place] ?[birth year] ?[id number] ? ?[facility] CM) ?  ?Patient chart reviewed with noted extreme high risk scores for unplanned readmission. Patient was screened for Embedded practice service needs for chronic care management and is active with the Embedded RNCM and Embedded Pharmacist for CCM at primary provider office. ?  ?Plan: Will continue to follow for progression and disposition plans. ? ?Of note, [facility] Care Management services does not replace or interfere with any services that are arranged by inpatient case management or social work.  ?  ?Netta Cedars, MSN, RN ?[facility] Liaison ?Phone [phone] ?Toll free office [phone] ?

## 2022-03-28 NOTE — Telephone Encounter (Signed)
IT IS CORRECT, TAKE 800 MG AFTER EACH DIALYSIS SESSION. She is currently admitted, and the computer will not allow me to send in a scrip. ?

## 2022-03-28 NOTE — Progress Notes (Signed)
Inpatient Diabetes Program Recommendations ? ?AACE/ADA: New Consensus Statement on Inpatient Glycemic Control (2015) ? ?Target Ranges:  Prepandial:   less than 140 mg/dL ?     Peak postprandial:   less than 180 mg/dL (1-2 hours) ?     Critically ill patients:  140 - 180 mg/dL  ? ?Lab Results  ?Component Value Date  ? GLUCAP 407 (H) 03/28/2022  ? HGBA1C 8.0 (H) 03/06/2022  ? ? ?Review of Glycemic Control ? Latest Reference Range & Units 03/27/22 23:36 03/28/22 07:26 03/28/22 11:31  ?Glucose-Capillary 70 - 99 mg/dL 111 (H) 279 (H) 407 (H)  ?(H): Data is abnormally high ?Diabetes history: Type 2 Dm ?Outpatient Diabetes medications: Basaglar 35 units QD, Ozempic 0.25 mg qwk ?Current orders for Inpatient glycemic control: Semglee 32 units QHS, Novolog 3 units TID, Novolog 0-15 units TID & HS ?Solumedrol 40 mg BID- completed ? ?Inpatient Diabetes Program Recommendations:   ? ?Consider increasing Semglee to 35 units QHS (patient's home dose).  ?Following.  ? ?Thanks, ?Bronson Curb, MSN, RNC-OB ?Diabetes Coordinator ?[phone] (8a-5p) ? ? ? ? ?

## 2022-03-28 NOTE — Progress Notes (Signed)
Patient ID: KATARZYNA WOLVEN, female   DOB: 07-18-1951, 71 y.o.   MRN: [id number] ?S:Had HD last night and able to UF 3 liters.  Currently 1 kg below edw and breathing better but had bleeding from cannulation sites overnight. ?O:BP 128/80 (BP Location: Left Arm)   Pulse 91   Temp 98.3 ?F (36.8 ?C) (Oral)   Resp 18   Ht 5\' 3"  (1.6 m)   Wt 81.1 kg Comment: standing scale  SpO2 98%   BMI 31.67 kg/m?  ? ?Intake/Output Summary (Last 24 hours) at 03/28/2022 0903 ?Last data filed at 03/28/2022 9373 ?Gross per 24 hour  ?Intake 960 ml  ?Output 3000 ml  ?Net -2040 ml  ? ?Intake/Output: ?I/O last 3 completed shifts: ?In: 898.1 [P.O.:720; I.V.:78.1; IV Piggyback:100] ?Out: 3000 [Other:3000] ? Intake/Output this shift: ? Total I/O ?In: 240 [P.O.:240] ?Out: -  ?Weight change: -0.676 kg ?Gen:NAD ?CVS: IRR IRR ?Resp: scattered rhonchi and end exp wheezes bilaterally ?Abd: +BS, soft, NT/ND ?Ext: no edema LUE AVF +T/B, bloody bandages in place. ? ?Recent Labs  ?Lab 03/26/22 ?1407 03/27/22 ?4287  ?NA 131* 130*  ?K 3.2* 4.3  ?CL 90* 92*  ?CO2 27 21*  ?GLUCOSE 168* 210*  ?BUN 22 33*  ?CREATININE 6.72* 8.02*  ?ALBUMIN 3.9  --   ?CALCIUM 8.6* 8.4*  ?AST 20  --   ?ALT 18  --   ? ?Liver Function Tests: ?Recent Labs  ?Lab 03/26/22 ?1407  ?AST 20  ?ALT 18  ?ALKPHOS 68  ?BILITOT 0.6  ?PROT 7.1  ?ALBUMIN 3.9  ? ?No results for input(s): LIPASE, AMYLASE in the last 168 hours. ?No results for input(s): AMMONIA in the last 168 hours. ?CBC: ?Recent Labs  ?Lab 03/26/22 ?1407 03/27/22 ?6811  ?WBC 15.2* 14.0*  ?HGB 9.8* 8.9*  ?HCT 30.8* 28.5*  ?MCV 90.3 89.6  ?PLT 240 223  ? ?Cardiac Enzymes: ?No results for input(s): CKTOTAL, CKMB, CKMBINDEX, TROPONINI in the last 168 hours. ?CBG: ?Recent Labs  ?Lab 03/27/22 ?0727 03/27/22 ?1132 03/27/22 ?1615 03/27/22 ?2336 03/28/22 ?0726  ?GLUCAP 237* 359* 170* 111* 279*  ? ? ?Iron Studies: No results for input(s): IRON, TIBC, TRANSFERRIN, FERRITIN in the last 72 hours. ?Studies/Results: ?DG Chest Portable 1  View ? ?Result Date: 03/26/2022 ?CLINICAL DATA:  Shortness of breath EXAM: PORTABLE CHEST 1 VIEW COMPARISON:  Chest radiograph dated March 17, 2022 FINDINGS: The heart is enlarged. Atherosclerotic calcification of the aortic arch. No focal consolidation or large pleural effusion. Partially imaged ACDF hardware and bilateral glenohumeral osteoarthritis. IMPRESSION: 1. Cardiomegaly with mild pulmonary vascular congestion. 2. No evidence of pulmonary edema or large pleural effusion. Electronically Signed   By: Keane Police D.O.   On: 03/26/2022 13:19   ? apixaban  5 mg Oral BID  ? atenolol  50 mg Oral BID  ? calcium acetate  1,334 mg Oral TID WC  ? Chlorhexidine Gluconate Cloth  6 each Topical Daily  ? Chlorhexidine Gluconate Cloth  6 each Topical Q0600  ? dextromethorphan-guaiFENesin  1 tablet Oral BID  ? diltiazem  240 mg Oral Daily  ? famotidine  20 mg Oral QHS  ? gabapentin  800 mg Oral Q M,W,F-1800  ? insulin aspart  0-15 Units Subcutaneous TID WC  ? insulin aspart  0-5 Units Subcutaneous QHS  ? insulin aspart  3 Units Subcutaneous TID WC  ? insulin glargine-yfgn  32 Units Subcutaneous QHS  ? ipratropium-albuterol  3 mL Nebulization Q6H  ? mometasone-formoterol  2 puff Inhalation BID  ?  QUEtiapine  200 mg Oral QHS  ? rOPINIRole  1 mg Oral QHS  ? rosuvastatin  5 mg Oral Daily  ? sodium chloride flush  3 mL Intravenous Q12H  ? sodium chloride flush  3 mL Intravenous Q12H  ? ticagrelor  90 mg Oral BID  ? ? ?BMET ?   ?Component Value Date/Time  ? NA 130 (L) 03/27/2022 0348  ? NA 137 03/17/2022 1136  ? K 4.3 03/27/2022 0348  ? CL 92 (L) 03/27/2022 0348  ? CO2 21 (L) 03/27/2022 0348  ? GLUCOSE 210 (H) 03/27/2022 0348  ? BUN 33 (H) 03/27/2022 0348  ? BUN 21 03/17/2022 1136  ? CREATININE 8.02 (H) 03/27/2022 0348  ? CREATININE 1.66 (H) 03/25/2013 1026  ? CALCIUM 8.4 (L) 03/27/2022 0348  ? GFRNONAA 5 (L) 03/27/2022 0348  ? GFRAA 4 (L) 11/01/2020 1022  ? ?CBC ?   ?Component Value Date/Time  ? WBC 14.0 (H) 03/27/2022 0348  ?  RBC 3.18 (L) 03/27/2022 0348  ? HGB 8.9 (L) 03/27/2022 0348  ? HGB 8.9 (LL) 03/17/2022 1136  ? HCT 28.5 (L) 03/27/2022 0348  ? HCT 27.8 (L) 03/17/2022 1136  ? PLT 223 03/27/2022 0348  ? PLT 232 03/17/2022 1136  ? MCV 89.6 03/27/2022 0348  ? MCV 88 03/17/2022 1136  ? MCH 28.0 03/27/2022 0348  ? MCHC 31.2 03/27/2022 0348  ? RDW 17.5 (H) 03/27/2022 0348  ? RDW 16.1 (H) 03/17/2022 1136  ? LYMPHSABS 1.8 03/17/2022 1136  ? MONOABS 0.6 03/05/2022 0740  ? EOSABS 0.5 (H) 03/17/2022 1136  ? BASOSABS 0.1 03/17/2022 1136  ? ?Dialysis Orders: Center: [facility]  on TTS . ?EDW 82 kg HD Bath 1K/2.5Ca  Time 4 hours Heparin 1000 units bolus then 1000 unts/hr. Access RUE AVF BFR 350 DFR 500    ?Calcitriol 2.25 mcg po/HD Micera 50 mcg IVP every 2 weeks  ?  ?  ?Assessment/Plan: ? Atrial fibrillation with RVR - remains tachycardic despite IV diltiazem.  On Eliquis and atenolol.  Cardiology to evaluate and offer other treatment recommendations.  Still remains in A fib and RVR.  May require amiodarone. ? Acute on chronic diastolic CHF - elevated BNP and likely due to a fib with RVR.  Plan for HD with UF as census permits.  ECHO also noted moderate RV dysfunction and pulmonary HTN.  UF 3 liters yesterday and below edw.   ? ESRD -  Normally runs TTS and reports that she made her edw on Saturday and tolerated it well.  Had extra HD session yesterday and is below edw.  Will plan for HD tomorrow given bleeding from AVF. ? Bleeding from RUE AVF - has been an issue since starting Brilinta following cardiac cath on 03/07/22.  S/p fistulogram on 03/14/22 with 80% stenosis of cephalic vein s/p PTA with 30% residual stenosis.  Will need Cardiology for input on anticoagulation. ? Hypertension/volume  - as above, able to UF 3 liters with HD on 03/27/22 and is 1 kg below outpatient EDW.  Plan for HD tomorrow and UF as tolerated. ? Anemia  - continue with ESA and follow H/H and iron stores. ? Metabolic bone disease -  continue with home meds ? Nutrition -  renal diet, carb modified ? CAD s/p recent NSTEMI and PTCA and DES to RCA and LAD - on 03/06/22.  Cardiology following. ? COPD - likely contributing to her SOB as she is feeling better after nebulizer and UF. ? ?Donetta Potts, MD ?[facility] ? ? ?

## 2022-03-28 NOTE — Progress Notes (Signed)
Day shift nurse redressed patient's right arm with fistula still with noted slight oozing at site pressure dressing was applied wit ace wrap day shift nurse stated Mds were aware and assessed today. Patient is not happy that fistula continues to bleed. ?

## 2022-03-28 NOTE — Progress Notes (Signed)
?PROGRESS NOTE ? ? ? ? ?[name], is a 71 y.o. female, DOB - 11/15/1951, VEL:[id number] ? ?Admit date - 03/26/2022   Admitting Physician Stevey Stapleton Denton Brick, MD ? ?Outpatient Primary MD for the patient is [name], MD ? ?LOS - 2 ? ?Chief Complaint  ?Patient presents with  ? Chest Pain  ?    ? ? ?Brief Narrative:  ?71 y.o. female with past medical history relevant for obesity, HTN, DM2, ESRD on hemodialysis on TTS schedule, COPD, recent diagnosis of paroxysmal A-fib in April 2023, CAD with recent angioplasty and stent placement on 03/08/2022, GERD, and chronic anemia of ESRD admitted on 03/26/2022 with acute on chronic diastolic CHF exacerbation ? ?  ?-Assessment and Plan: ?1)PAFib with RVR--first diagnosed earlier this month ?--Treated with IV Cardizem ?- cardiology recommends Cardizem CD 240 mg daily ?-Continue PTA Eliquis  ?-Cardiology recommends increasing atenolol to 75 mg twice daily ?-Echo from 03/05/2022 with EF of 55% with severe pulmonary hypertension and mildly dilated right and left atrium ?  ?2)HFpEF--- acute on chronic diastolic dysfunction exacerbation in the setting of #1 above ?-Chest x-ray consistent with pulmonary congestion ?-BNP 2276 up from 1600 recently ?-Despite IV Lasix no urine output ?-Consider extra hemodialysis session to address volume status ?-Recent echo as above #1 ?Cardiorespiratory status improved after extra /off schedule hemodialysis on 03/27/2022 ?  ?3)CAD/Recent NSTEMI--- troponin 77, down from 181 about 3 weeks ago ?-LHC on 03/06/2022 consistent with severe two-vessel disease involving RCA and LAD ?-Status post coronary atherectomy/angioplasty PCI on 03/08/2022 ?-Deemed to be too high risk a candidate for CABG ?-Continue Brilinta, Crestor and atenolol ?-Per cardiology's plan is to continue Eliquis and Brilinta for 90 days from 03/08/2022 ?  ?4)Leukocytosis---  ?-- patient received vancomycin and cefepime in the ED ?-Chest x-ray not suggestive of pneumonia ?-Cultures obtained ?-WBC  trending back up suspect due to steroid therapy  ?  ?5)Depression/anxiety--continue Seroquel 200 mg nightly along with hydroxyzine as needed anxiety ?  ?6)ESRD--TTS, last HD 03/25/2022 completed 4 hours of HD ?-Metabolic Bone Disease: Continue PhosLo for hyperphosphatemia. ?-Nephrology consult appreciated ?Received extra HD session on 03/27/2022 off schedule ?-Right arm AV fistula has been bleeding since 03/27/22 ?-  She did undergo fistulogram in [place] with Dr. Virl Cagey on 03/14/2022 for bleeding fistula ?-See full consult from vascular surgeon Dr. Sherren Mocha Early dated 03/28/2022--regarding bleeding from right AV fistula ?  ?7)DM2-A1c 8.0 reflecting uncontrolled diabetes PTA ?-Continue Lantus , now increased to that patient is off steroids may be able to decrease Lantus ?-Anticipate worsening hyperglycemia with steroids ?Use Novolog/Humalog Sliding scale insulin with Accu-Cheks/Fingersticks as ordered  ?  ?8)HTN-----increase atenolol to 75 mg nightly, and p.o. Cardizem CD 240 mg daily-May use IV hydralazine as needed elevated BP ?  ?9)COPD with pulmonary hypertension--patient with significant dyspnea, cough and slight wheezing ?-Completed  Solu-Medrol , c/n   mucolytics ?-Be judicious with bronchodilators in the setting of A-fib with RVR ?  ?10) chronic anemia of ESRD---- ?Hgb stable around 9  ?monitor closely especially while on Brilinta and Eliquis ?-Defer decision on ESA/Procrit to nephrology team ?  ?11) hypokalemia/hyponatremia --- magnesium WNL,  ?--Potassium normalized after replacement ?  ?12) chronic anticoagulation--- given angioplasty and stenting on 03/08/2022 in the setting of A-fib patient was advised to take Eliquis and Brilinta for 3 months through July 2023 and then transition most likely to aspirin ? ?13)Pulm hypertension--- patient wondering if she qualifies for home oxygen ?Get NOD study (Nocturnal oxygen desaturation study)--- overnight to see if she qualifies for  home O2 ?  ?Disposition/Need for  in-Hospital Stay- patient unable to be discharged at this time due to -  ?-Right arm AV fistula bleeding/oozing--May need placement HD catheter for hemodialysis if unable to use fistula due to recurrent bleeding ?-Possible hemodialysis on 03/29/2022 if fistula not bleeding anymore ?  ?Status is: Inpatient ?  ?Remains inpatient appropriate because:  ?  ?Dispo: The patient is from: Home ?             Anticipated d/c is to: Home ?             Anticipated d/c date is: 2 days ?             Patient currently is not medically stable to d/c. ?Barriers: Not Clinically Stable-  ? ?Code Status :  -  Code Status: Full Code  ? ?Family Communication:    (patient is alert, awake and coherent)  ? ?DVT Prophylaxis  :   - SCDs  SCDs Start: 03/26/22 1621 ?Place TED hose Start: 03/26/22 1621 ?apixaban (ELIQUIS) tablet 5 mg  ? ?Lab Results  ?Component Value Date  ? PLT 276 03/28/2022  ? ? ?Inpatient Medications ? ?Scheduled Meds: ? apixaban  5 mg Oral BID  ? atenolol  50 mg Oral QHS  ? calcium acetate  1,334 mg Oral TID WC  ? Chlorhexidine Gluconate Cloth  6 each Topical Daily  ? Chlorhexidine Gluconate Cloth  6 each Topical Q0600  ? dextromethorphan-guaiFENesin  1 tablet Oral BID  ? diltiazem  240 mg Oral Daily  ? famotidine  20 mg Oral QHS  ? gabapentin  800 mg Oral Q M,W,F-1800  ? insulin aspart  0-15 Units Subcutaneous TID WC  ? insulin aspart  0-5 Units Subcutaneous QHS  ? insulin aspart  3 Units Subcutaneous TID WC  ? insulin glargine-yfgn  32 Units Subcutaneous QHS  ? ipratropium-albuterol  3 mL Nebulization Q6H  ? mometasone-formoterol  2 puff Inhalation BID  ? QUEtiapine  200 mg Oral QHS  ? rOPINIRole  1 mg Oral QHS  ? rosuvastatin  5 mg Oral Daily  ? sodium chloride flush  3 mL Intravenous Q12H  ? sodium chloride flush  3 mL Intravenous Q12H  ? ticagrelor  90 mg Oral BID  ? ?Continuous Infusions: ? sodium chloride    ? sodium chloride    ? sodium chloride    ? diltiazem (CARDIZEM) infusion Stopped (03/27/22 1721)  ? ?PRN  Meds:.sodium chloride, sodium chloride, sodium chloride, acetaminophen **OR** acetaminophen, albuterol, alteplase, bisacodyl, calcium acetate **AND** calcium acetate, guaiFENesin, heparin, heparin, hydrALAZINE, hydrOXYzine, lidocaine (PF), lidocaine-prilocaine, ondansetron **OR** ondansetron (ZOFRAN) IV, pentafluoroprop-tetrafluoroeth, polyethylene glycol, sodium chloride flush ? ? ?Anti-infectives (From admission, onward)  ? ? Start     Dose/Rate Route Frequency Ordered Stop  ? 03/26/22 1515  vancomycin (VANCOREADY) IVPB 1500 mg/300 mL       ? 1,500 mg ?150 mL/hr over 120 Minutes Intravenous  Once 03/26/22 1441 03/26/22 1646  ? 03/26/22 1500  ceFEPIme (MAXIPIME) 1 g in sodium chloride 0.9 % 100 mL IVPB       ? 1 g ?200 mL/hr over 30 Minutes Intravenous  Once 03/26/22 1434 03/26/22 1907  ? ?  ? ? ?Subjective: ?[name] today has no fevers, no emesis,  No chest pain,  -right arm AV fistula continues to ooze ?Vascular surgeon evaluated and applied dressing ?-Cough and shortness of breath improving ? ?Objective: ?Vitals:  ? 03/28/22 0817 03/28/22 1257 03/28/22 1411 03/28/22 1942  ?BP:  124/77    ?Pulse:  91    ?Resp:  20    ?Temp:  97.9 ?F (36.6 ?C)    ?TempSrc:  Oral    ?SpO2: 98% 97% 96% 97%  ?Weight:      ?Height:      ? ? ?Intake/Output Summary (Last 24 hours) at 03/28/2022 2015 ?Last data filed at 03/28/2022 1900 ?Gross per 24 hour  ?Intake 960 ml  ?Output 3000 ml  ?Net -2040 ml  ? ?Filed Weights  ? 03/27/22 1904 03/27/22 2328 03/28/22 0500  ?Weight: 84.6 kg 81.4 kg 81.1 kg  ? ?Physical Exam ? ?Gen:- Awake Alert, some conversational dyspnea, ?HEENT:- [place].AT, No sclera icterus ?Neck-Supple Neck, ?Lungs-improving air movement , faint bibasilar rales ?CV- S1, S2 normal, irregularly irregular and tachycardic ?Abd-  +ve B.Sounds, Abd Soft, No tenderness,    ?Extremity/Skin:- trace  edema, pedal pulses present  ?Psych-affect is appropriate, oriented x3 ?Neuro-no new focal deficits, no tremors ?MSK-right upper  extremity AV fistula with thrill and bruit (oozing--pressure dressing on) ? ?Data Reviewed: I have personally reviewed following labs and imaging studies ? ?CBC: ?Recent Labs  ?Lab 03/26/22 ?1407 03/27/22 ?5102 03/28/22 ?0

## 2022-03-28 NOTE — Progress Notes (Signed)
Patient had 19 beat run vtach, MD notified  ?

## 2022-03-28 NOTE — Progress Notes (Addendum)
? ?Progress Note ? ?Patient Name: [name] ?Date of Encounter: 03/28/2022 ? ?[facility] Cardiologist: Minus Breeding, MD  ? ?Subjective  ? ?Breathing improved today.  Denies any chest pain.   ? ?Inpatient Medications  ?  ?Scheduled Meds: ? apixaban  5 mg Oral BID  ? atenolol  50 mg Oral BID  ? calcium acetate  1,334 mg Oral TID WC  ? Chlorhexidine Gluconate Cloth  6 each Topical Daily  ? Chlorhexidine Gluconate Cloth  6 each Topical Q0600  ? dextromethorphan-guaiFENesin  1 tablet Oral BID  ? diltiazem  240 mg Oral Daily  ? famotidine  20 mg Oral QHS  ? gabapentin  800 mg Oral Q M,W,F-1800  ? insulin aspart  0-15 Units Subcutaneous TID WC  ? insulin aspart  0-5 Units Subcutaneous QHS  ? insulin aspart  3 Units Subcutaneous TID WC  ? insulin glargine-yfgn  32 Units Subcutaneous QHS  ? ipratropium-albuterol  3 mL Nebulization Q6H  ? mometasone-formoterol  2 puff Inhalation BID  ? QUEtiapine  200 mg Oral QHS  ? rOPINIRole  1 mg Oral QHS  ? rosuvastatin  5 mg Oral Daily  ? sodium chloride flush  3 mL Intravenous Q12H  ? sodium chloride flush  3 mL Intravenous Q12H  ? ticagrelor  90 mg Oral BID  ? ?Continuous Infusions: ? sodium chloride    ? sodium chloride    ? sodium chloride    ? diltiazem (CARDIZEM) infusion Stopped (03/27/22 1721)  ? ?PRN Meds: ?sodium chloride, sodium chloride, sodium chloride, acetaminophen **OR** acetaminophen, albuterol, alteplase, bisacodyl, calcium acetate **AND** calcium acetate, guaiFENesin, heparin, heparin, hydrALAZINE, hydrOXYzine, lidocaine (PF), lidocaine-prilocaine, ondansetron **OR** ondansetron (ZOFRAN) IV, pentafluoroprop-tetrafluoroeth, polyethylene glycol, sodium chloride flush  ? ?Vital Signs  ?  ?Vitals:  ? 03/27/22 2328 03/28/22 0439 03/28/22 0500 03/28/22 0817  ?BP: (!) 148/66 128/80    ?Pulse: 98 91    ?Resp: 19 18    ?Temp: 98.1 ?F (36.7 ?C) 98.3 ?F (36.8 ?C)    ?TempSrc:  Oral    ?SpO2: 96% 99%  98%  ?Weight: 81.4 kg  81.1 kg   ?Height:      ? ? ?Intake/Output  Summary (Last 24 hours) at 03/28/2022 1141 ?Last data filed at 03/28/2022 3244 ?Gross per 24 hour  ?Intake 960 ml  ?Output 3000 ml  ?Net -2040 ml  ? ? ?  03/28/2022  ?  5:00 AM 03/27/2022  ? 11:28 PM 03/27/2022  ?  7:04 PM  ?Last 3 Weights  ?Weight (lbs) 178 lb 12.7 oz 179 lb 7.3 oz 186 lb 8.2 oz  ?Weight (kg) 81.1 kg 81.4 kg 84.6 kg  ?   ? ?Telemetry  ?  ?Atrial fibrillation - Personally Reviewed ? ?ECG  ?  ?No new EKG to review- Personally Reviewed ? ?Physical Exam  ? ?GEN: No acute distress.   ?Neck: No JVD ?Cardiac: Irregularly irregular, no murmurs, rubs, or gallops.  ?Respiratory: Diffuse expiratory wheezes ?GI: Soft, nontender, non-distended  ?MS: No edema; No deformity. ?Neuro:  Nonfocal  ?Psych: Normal affect  ? ?Labs  ?  ?High Sensitivity Troponin:   ?Recent Labs  ?Lab 03/04/22 ?2246 03/05/22 ?0323 03/26/22 ?1407 03/26/22 ?1630 03/27/22 ?0102  ?TROPONINIHS 105* 181* 77* 84* 81*  ?   ? ?Chemistry ?Recent Labs  ?Lab 03/26/22 ?1407 03/27/22 ?7253 03/28/22 ?0900  ?NA 131* 130* 131*  ?K 3.2* 4.3 4.7  ?CL 90* 92* 92*  ?CO2 27 21* 25  ?GLUCOSE 168* 210* 350*  ?BUN  22 33* 35*  ?CREATININE 6.72* 8.02* 5.96*  ?CALCIUM 8.6* 8.4* 9.3  ?PROT 7.1  --   --   ?ALBUMIN 3.9  --  3.7  ?AST 20  --   --   ?ALT 18  --   --   ?ALKPHOS 68  --   --   ?BILITOT 0.6  --   --   ?GFRNONAA 6* 5* 7*  ?ANIONGAP 14 17* 14  ?  ? ?Hematology ?Recent Labs  ?Lab 03/26/22 ?1407 03/27/22 ?7096 03/28/22 ?0900  ?WBC 15.2* 14.0* 20.9*  ?RBC 3.41* 3.18* 3.20*  ?HGB 9.8* 8.9* 9.2*  ?HCT 30.8* 28.5* 27.8*  ?MCV 90.3 89.6 86.9  ?MCH 28.7 28.0 28.8  ?MCHC 31.8 31.2 33.1  ?RDW 17.7* 17.5* 17.9*  ?PLT 240 223 276  ? ? ?BNP ?Recent Labs  ?Lab 03/26/22 ?1407  ?BNP 2,276.0*  ?  ? ?DDimer No results for input(s): DDIMER in the last 168 hours. ? ? ?CHA2DS2-VASc Score = 6  ?This indicates a 9.7% annual risk of stroke. ?[name] score is based upon: ?CHF History: 1 ?HTN History: 1 ?Diabetes History: 1 ?Stroke History: 0 ?Vascular Disease History: 1 ?Age Score:  1 ?Gender Score: 1 ?  ?  ?DG Chest Portable 1 View ? ?Result Date: 03/26/2022 ?CLINICAL DATA:  Shortness of breath EXAM: PORTABLE CHEST 1 VIEW COMPARISON:  Chest radiograph dated March 17, 2022 FINDINGS: The heart is enlarged. Atherosclerotic calcification of the aortic arch. No focal consolidation or large pleural effusion. Partially imaged ACDF hardware and bilateral glenohumeral osteoarthritis. IMPRESSION: 1. Cardiomegaly with mild pulmonary vascular congestion. 2. No evidence of pulmonary edema or large pleural effusion. Electronically Signed   By: Keane Police D.O.   On: 03/26/2022 13:19   ? ?Cardiac Studies  ? ?TTE 03/05/2022 ? 1. Left ventricular ejection fraction, by estimation, is 55%%. The left  ?ventricle has low normal function. The left ventricle has no regional wall  ?motion abnormalities. There is mild left ventricular hypertrophy. Left  ?ventricular diastolic parameters  ?are indeterminate.  ? 2. Compared to prior study, RV dysfunction is new  ?    [name] Right ventricular systolic function moderate to severely decreased.  ?The right ventricular size is mildly enlarged. There is severely elevated  ?pulmonary artery systolic pressure.  ? 3. Left atrial size was mildly dilated.  ? 4. Right atrial size was mildly dilated.  ? 5. A small pericardial effusion is present.  ? 6. Mild mitral valve regurgitation.  ? 7. The aortic valve is tricuspid. Aortic valve regurgitation is not  ?visualized. Aortic valve sclerosis is present, with no evidence of aortic  ?valve stenosis.  ? 8. The inferior vena cava is dilated in size with <50% respiratory  ?variability, suggesting right atrial pressure of 15 mmHg.  ?  ?LHC 03/08/2022 ?1.  Successful atherectomy, PTCA, and stenting of severe stenosis in the mid LAD, reducing 90% calcific stenosis to 0%, TIMI-3 flow pre and post, with a 3.0 x 18 mm Onyx frontier DES ?2.  Successful atherectomy, PTCA, and stenting of severe stenosis in the mid RCA, reducing 95% calcific stenosis to  0%, TIMI-3 flow pre and post, with a 3.0 x 34 mm Onyx frontier DES ? ?Patient Profile  ?   ?71 y.o. female with a hx of CAD, PAF who is being seen 03/27/2022 for the evaluation of A-fib with RVR at the request of Dr Denton Brick. ? ?Assessment & Plan  ?  ?Persistent atrial fibrillation with RVR  ?-on Eliquis and atenolol  50 mg bid  ?-Remains in atrial fibrillation and tachycardic ?-Potassium repleted at 4.7 unit and magnesium 2.2 this morning  ?-Had 19 beat run of ventricular tachycardia in the past 24 hours but otherwise heart rate fairly well controlled  ?-Continue Cardizem XL 240 mg daily and increase atenolol to 75 mg twice daily for suppression of VT and better rate control in A-fib ?-if cannot get rate controlled on higher doses of BB may need to consider IV Amio ?-Keep potassium greater than 4 mag greater than 2 ? ?Acute on chronic diastolic heart failure  ?-BNP 2276  ?-treated with IV Lasix but not urinating. she is also on dialysis nephrology managing volume with dialysis ?-She was net -3 L with dialysis yesterday and is net -1.7 L since admission ?-per nephrology she is at her dry weight and still very SOB>>her lungs sound terrible with wheezes and suspect that most of her SOB is related to COPD exacerbation ? ?Nonsustained ventricular tachycardia ?-She had a 19 beat run of ventricular tachycardia in the past 24 hours in setting of normal potassium and magnesium level ?-2D echo with normal LV function on 03/05/2022 but moderate to severe RV dysfunction ?-Increase atenolol to 75 mg twice daily ?-Keep K greater than 4 and mag greater than 2 ? ?Severe pulmonary hypertension ?-Likely a combination of who group 2 from chronic diastolic CHF as well as WHO group 3 from COPD ?-PASP has increased from 38 to 54mmHg since January 2023 by echo so need to make sure not dealing with acute thromboembolic event but suspect mainly due to volume overload and worsening COPD with acute increase in PAP from acute hypoxemia ?-Needs  aggressive treatment of COPD ?-Continue volume removal with dialysis ?-Sleep study in January 2022 showed no significant sleep apnea and an AHI of 1.1/h ?-Check ANA and rheumatoid factor to rule out rheumatol

## 2022-03-28 NOTE — Telephone Encounter (Signed)
Fax from [facility] ?RE: Gabapentin 400 mg cap 2 cap (800 mg) by mouth see Admin instructions. Take it after HD ?Note from pharmacy: Please verify how often the pt is suppose to take medication ?Please advise / clarify and send new script ?

## 2022-03-28 NOTE — Progress Notes (Signed)
Patient HD site with noted bleeding, RN charge reinforced dressing to fistula site will continue to monitor.  ?

## 2022-03-28 NOTE — Progress Notes (Signed)
Patient returned from HD. VSS still afib on cardiac monitor. Dialysis nurse reported 3 liters removed. Patient eating dinner. ? ?

## 2022-03-29 ENCOUNTER — Ambulatory Visit: Payer: Medicare Other | Admitting: Cardiology

## 2022-03-29 ENCOUNTER — Inpatient Hospital Stay (HOSPITAL_COMMUNITY): Payer: Medicare Other

## 2022-03-29 ENCOUNTER — Ambulatory Visit (INDEPENDENT_AMBULATORY_CARE_PROVIDER_SITE_OTHER): Payer: Medicare Other

## 2022-03-29 DIAGNOSIS — E1169 Type 2 diabetes mellitus with other specified complication: Secondary | ICD-10-CM

## 2022-03-29 DIAGNOSIS — N186 End stage renal disease: Secondary | ICD-10-CM | POA: Diagnosis not present

## 2022-03-29 DIAGNOSIS — E871 Hypo-osmolality and hyponatremia: Secondary | ICD-10-CM | POA: Diagnosis not present

## 2022-03-29 DIAGNOSIS — I5033 Acute on chronic diastolic (congestive) heart failure: Secondary | ICD-10-CM | POA: Diagnosis not present

## 2022-03-29 DIAGNOSIS — E1151 Type 2 diabetes mellitus with diabetic peripheral angiopathy without gangrene: Secondary | ICD-10-CM

## 2022-03-29 DIAGNOSIS — I4819 Other persistent atrial fibrillation: Secondary | ICD-10-CM | POA: Diagnosis not present

## 2022-03-29 DIAGNOSIS — D631 Anemia in chronic kidney disease: Secondary | ICD-10-CM

## 2022-03-29 DIAGNOSIS — E876 Hypokalemia: Secondary | ICD-10-CM | POA: Diagnosis not present

## 2022-03-29 DIAGNOSIS — I152 Hypertension secondary to endocrine disorders: Secondary | ICD-10-CM | POA: Diagnosis not present

## 2022-03-29 DIAGNOSIS — K222 Esophageal obstruction: Secondary | ICD-10-CM

## 2022-03-29 DIAGNOSIS — E1159 Type 2 diabetes mellitus with other circulatory complications: Secondary | ICD-10-CM

## 2022-03-29 DIAGNOSIS — I2510 Atherosclerotic heart disease of native coronary artery without angina pectoris: Secondary | ICD-10-CM

## 2022-03-29 DIAGNOSIS — J449 Chronic obstructive pulmonary disease, unspecified: Secondary | ICD-10-CM

## 2022-03-29 DIAGNOSIS — G4733 Obstructive sleep apnea (adult) (pediatric): Secondary | ICD-10-CM

## 2022-03-29 DIAGNOSIS — E1140 Type 2 diabetes mellitus with diabetic neuropathy, unspecified: Secondary | ICD-10-CM

## 2022-03-29 DIAGNOSIS — Z48812 Encounter for surgical aftercare following surgery on the circulatory system: Secondary | ICD-10-CM | POA: Diagnosis not present

## 2022-03-29 DIAGNOSIS — E559 Vitamin D deficiency, unspecified: Secondary | ICD-10-CM

## 2022-03-29 DIAGNOSIS — F419 Anxiety disorder, unspecified: Secondary | ICD-10-CM

## 2022-03-29 DIAGNOSIS — E1122 Type 2 diabetes mellitus with diabetic chronic kidney disease: Secondary | ICD-10-CM

## 2022-03-29 DIAGNOSIS — I4891 Unspecified atrial fibrillation: Secondary | ICD-10-CM | POA: Diagnosis not present

## 2022-03-29 DIAGNOSIS — K219 Gastro-esophageal reflux disease without esophagitis: Secondary | ICD-10-CM

## 2022-03-29 DIAGNOSIS — I25119 Atherosclerotic heart disease of native coronary artery with unspecified angina pectoris: Secondary | ICD-10-CM | POA: Diagnosis not present

## 2022-03-29 DIAGNOSIS — E785 Hyperlipidemia, unspecified: Secondary | ICD-10-CM | POA: Diagnosis not present

## 2022-03-29 LAB — RENAL FUNCTION PANEL
Albumin: 3.4 g/dL — ABNORMAL LOW (ref 3.5–5.0)
Anion gap: 15 (ref 5–15)
BUN: 54 mg/dL — ABNORMAL HIGH (ref 8–23)
CO2: 24 mmol/L (ref 22–32)
Calcium: 9.1 mg/dL (ref 8.9–10.3)
Chloride: 92 mmol/L — ABNORMAL LOW (ref 98–111)
Creatinine, Ser: 7.68 mg/dL — ABNORMAL HIGH (ref 0.44–1.00)
GFR, Estimated: 5 mL/min — ABNORMAL LOW (ref >60)
Glucose, Bld: 104 mg/dL — ABNORMAL HIGH (ref 70–99)
Phosphorus: 5.5 mg/dL — ABNORMAL HIGH (ref 2.5–4.6)
Potassium: 4.9 mmol/L (ref 3.5–5.1)
Sodium: 131 mmol/L — ABNORMAL LOW (ref 135–145)

## 2022-03-29 LAB — ANTINUCLEAR ANTIBODIES, IFA: ANA Ab, IFA: NEGATIVE

## 2022-03-29 LAB — GLUCOSE, CAPILLARY
Glucose-Capillary: 155 mg/dL — ABNORMAL HIGH (ref 70–99)
Glucose-Capillary: 147 mg/dL — ABNORMAL HIGH (ref 70–99)
Glucose-Capillary: 80 mg/dL (ref 70–99)
Glucose-Capillary: 252 mg/dL — ABNORMAL HIGH (ref 70–99)

## 2022-03-29 LAB — CBC
HCT: 27.3 % — ABNORMAL LOW (ref 36.0–46.0)
Hemoglobin: 8.9 g/dL — ABNORMAL LOW (ref 12.0–15.0)
MCH: 28.8 pg (ref 26.0–34.0)
MCHC: 32.6 g/dL (ref 30.0–36.0)
MCV: 88.3 fL (ref 80.0–100.0)
Platelets: 274 10*3/uL (ref 150–400)
RBC: 3.09 MIL/uL — ABNORMAL LOW (ref 3.87–5.11)
RDW: 18 % — ABNORMAL HIGH (ref 11.5–15.5)
WBC: 21 10*3/uL — ABNORMAL HIGH (ref 4.0–10.5)
nRBC: 1.8 % — ABNORMAL HIGH (ref 0.0–0.2)

## 2022-03-29 LAB — PROCALCITONIN: Procalcitonin: 2.55 ng/mL

## 2022-03-29 LAB — HEPATITIS B SURFACE ANTIBODY, QUANTITATIVE: Hep B S AB Quant (Post): 709.6 m[IU]/mL (ref >9.9)

## 2022-03-29 LAB — LACTIC ACID, PLASMA: Lactic Acid, Venous: 1.3 mmol/L (ref 0.5–1.9)

## 2022-03-29 LAB — RHEUMATOID FACTOR: Rheumatoid fact SerPl-aCnc: <10 IU/mL (ref <14.0)

## 2022-03-29 MED ORDER — DM-GUAIFENESIN ER 30-600 MG PO TB12: 1.0000 | ORAL_TABLET | Freq: Two times a day (BID) | ORAL | Status: DC | PRN

## 2022-03-29 MED ORDER — GUAIFENESIN 100 MG/5ML PO LIQD
10.0000 mL | Freq: Three times a day (TID) | ORAL | Status: DC
  Administered 2022-03-29 – 2022-03-31 (×5): 10 mL via ORAL
  Filled 2022-03-29 (×6): qty 10

## 2022-03-29 MED ORDER — SODIUM CHLORIDE 0.9 % IV SOLN
2.0000 g | INTRAVENOUS | Status: DC
  Administered 2022-03-29 – 2022-03-30 (×2): 2 g via INTRAVENOUS
  Filled 2022-03-29: qty 12.5

## 2022-03-29 NOTE — Progress Notes (Signed)
?PROGRESS NOTE ? ? ? ? ?[name], is a 71 y.o. female, DOB - 1951-09-23, QMG:[id number] ? ?Admit date - 03/26/2022   Admitting Physician Tamara Denton Brick, MD ? ?Outpatient Primary MD for the patient is Tamara Fraise, MD ? ?LOS - 3 ? ?Chief Complaint  ?Patient presents with  ? Chest Pain  ?    ? ? ?Subjective: ?[name] was seen and examined this morning, complaining of cough, chest congestion stating she is not feeling well ? ?-Right arm AV fistula continue to ooze, tight dressing in place -Per vascular surgeon recommendation ? ? ? ? ? ?Brief Narrative:  ?71 y.o. female with past medical history relevant for obesity, HTN, DM2, ESRD on hemodialysis on TTS schedule, COPD, recent diagnosis of paroxysmal A-fib in April 2023, CAD with recent angioplasty and stent placement on 03/08/2022, GERD, and chronic anemia of ESRD admitted on 03/26/2022 with acute on chronic diastolic CHF exacerbation ? ?  ?Assessment and Plan: ? ?1)PAFib with RVR- ?-New diagnosis, first diagnosed earlier this month ?--Treated with IV Cardizem, now on p.o. Cardizem CD 240 mg daily ?-Continue PTA Eliquis  ?-Cardiology recommends increasing Atenolol to 75 mg twice daily ?-Echo from 03/05/2022 with EF of 55% with severe pulmonary hypertension and mildly dilated right and left atrium ?  ?2)HFpEF- ?-- acute on chronic diastolic dysfunction exacerbation in the setting of #1 above ?-Chest x-ray consistent with pulmonary congestion ?-BNP 2276 up from 1600 recently ?-Despite IV Lasix no urine output ?-Consider extra hemodialysis session to address volume status ?-Recent echo as above #1 ?Cardiorespiratory status improved after extra /off schedule hemodialysis on 03/27/2022 ?-Planning for hemodialysis today 03/29/2022 ? ?3)CAD/Recent NSTEMI- ?-- troponin 77, down from 181 about 3 weeks ago ?-LHC on 03/06/2022 consistent with severe two-vessel disease involving RCA and LAD ?-Status post coronary atherectomy/angioplasty PCI on 03/08/2022 ?-Deemed to be too high  risk a candidate for CABG ?-Continue Brilinta, Crestor and Atenolol ?-Per cardiology's plan is to continue Eliquis and Brilinta for 90 days from 03/08/2022 ?  ?4)Leukocytosis---  ?-- patient received vancomycin and cefepime in the ED ?-Chest x-ray suggestive of inflammatory changes possible infection-pneumonia ?-Cultures obtained ?-Empiric IV antibiotic cefepime >> 4/26 initiated with plan for quick taper ?-WBC trending back up suspected also contributed by steroids ?  ?5)Depression/anxiety--continue Seroquel 200 mg nightly along with hydroxyzine as needed anxiety ?  ?6)ESRD--TTS, last HD 03/25/2022 completed 4 hours of HD ?-Metabolic Bone Disease: Continue PhosLo for hyperphosphatemia. ?-Nephrology consult appreciated ?Received extra HD session on 03/27/2022 off schedule ?-Right arm AV fistula has been bleeding since 03/27/22 ?-  She did undergo fistulogram in [place] with Dr. Virl Baker on 03/14/2022 for bleeding fistula ?-See full consult from vascular surgeon Dr. Sherren Mocha Baker dated 03/28/2022--regarding bleeding from right AV fistula ?-Planned  hemodialysis 03/29/2022 ? ?  ?7)DM2-A1c 8.0  ?-reflecting uncontrolled diabetes PTA ?-Continue Lantus , now increased to that patient is off steroids may be able to decrease Lantus ?-Anticipate worsening hyperglycemia with steroids ?Use Novolog/Humalog Sliding scale insulin with Accu-Cheks/Fingersticks as ordered  ?  ?8)HTN-----increase atenolol to 75 mg nightly, and p.o. Cardizem CD 240 mg daily-May use IV hydralazine as needed elevated BP ?  ?9)COPD with pulmonary hypertension--patient with significant dyspnea, cough and slight wheezing ?-Completed  Solu-Medrol , c/n   mucolytics ?-Tapered off supplemental oxygen ?-Be judicious with bronchodilators in the setting of A-fib with RVR ?  ?10) chronic anemia of ESRD---- ?Hgb stable around 9  ?monitor closely especially while on Brilinta and Eliquis ?-Defer decision on ESA/Procrit to nephrology team ?  ?  11) hypokalemia/hyponatremia ---  magnesium WNL,  ?--Potassium normalized after replacement ?  ?12) chronic anticoagulation--- given angioplasty and stenting on 03/08/2022 in the setting of A-fib patient was advised to take Eliquis and Brilinta for 3 months through July 2023 and then transition most likely to aspirin ? ?13)Pulm hypertension--- patient wondering if she qualifies for home oxygen ?Get NOD study (Nocturnal oxygen desaturation study)-- ?-She was monitored and tapered off supplemental oxygen, did not qualify for home O2 at this time ?-94% on room air ? ? ?---------------------------------------------------------------------------------------------------------- ? ?  ?Disposition/Need for in-Hospital Stay- patient unable to be discharged at this time due to -  ?-Right arm AV fistula bleeding/oozing--May need placement HD catheter for hemodialysis if unable to use fistula due to recurrent bleeding ?-Hemodialysis on 03/29/2022 if fistula not bleeding anymore ?  ?Status is: Inpatient ?  ?Remains inpatient appropriate because:  ?  ?Dispo: The patient is from: Home ?             Anticipated d/c is to: Home with home health ?             Anticipated d/c date is: 2 days ?             Patient currently is not medically stable to d/c. ?Barriers: Not Clinically Stable-  ? ?Code Status :  -  Code Status: Full Code  ? ?Family Communication:    (patient is alert, awake and coherent)  ? ?DVT Prophylaxis  :   - SCDs  SCDs Start: 03/26/22 1621 ?Place TED hose Start: 03/26/22 1621 ?apixaban (ELIQUIS) tablet 5 mg  ? ?Lab Results  ?Component Value Date  ? PLT 274 03/29/2022  ? ? ?Inpatient Medications ? ?Scheduled Meds: ? apixaban  5 mg Oral BID  ? atenolol  75 mg Oral QHS  ? calcium acetate  1,334 mg Oral TID WC  ? Chlorhexidine Gluconate Cloth  6 each Topical Daily  ? Chlorhexidine Gluconate Cloth  6 each Topical Q0600  ? diltiazem  240 mg Oral Daily  ? famotidine  20 mg Oral QHS  ? gabapentin  800 mg Oral Q M,W,F-1800  ? guaiFENesin  10 mL Oral Q8H  ? insulin  aspart  0-15 Units Subcutaneous TID WC  ? insulin aspart  0-5 Units Subcutaneous QHS  ? insulin aspart  3 Units Subcutaneous TID WC  ? insulin glargine-yfgn  32 Units Subcutaneous QHS  ? ipratropium-albuterol  3 mL Nebulization Q6H  ? mometasone-formoterol  2 puff Inhalation BID  ? QUEtiapine  200 mg Oral QHS  ? rOPINIRole  1 mg Oral QHS  ? rosuvastatin  5 mg Oral Daily  ? sodium chloride flush  3 mL Intravenous Q12H  ? sodium chloride flush  3 mL Intravenous Q12H  ? ticagrelor  90 mg Oral BID  ? ?Continuous Infusions: ? sodium chloride    ? sodium chloride    ? ceFEPime (MAXIPIME) IV    ? ?PRN Meds:.sodium chloride, sodium chloride, acetaminophen **OR** acetaminophen, albuterol, alteplase, bisacodyl, calcium acetate **AND** calcium acetate, dextromethorphan-guaiFENesin, heparin, heparin, hydrALAZINE, hydrOXYzine, lidocaine (PF), lidocaine-prilocaine, ondansetron **OR** ondansetron (ZOFRAN) IV, pentafluoroprop-tetrafluoroeth, polyethylene glycol, sodium chloride flush ? ? ?Anti-infectives (From admission, onward)  ? ? Start     Dose/Rate Route Frequency Ordered Stop  ? 03/29/22 1200  ceFEPIme (MAXIPIME) 2 g in sodium chloride 0.9 % 100 mL IVPB       ? 2 g ?200 mL/hr over 30 Minutes Intravenous Every T-Th-Sa (Hemodialysis) 03/29/22 1030    ? 03/26/22 1515  vancomycin (VANCOREADY) IVPB 1500 mg/300 mL       ? 1,500 mg ?150 mL/hr over 120 Minutes Intravenous  Once 03/26/22 1441 03/26/22 1646  ? 03/26/22 1500  ceFEPIme (MAXIPIME) 1 g in sodium chloride 0.9 % 100 mL IVPB       ? 1 g ?200 mL/hr over 30 Minutes Intravenous  Once 03/26/22 1434 03/26/22 1907  ? ?  ? ? ? ?Objective: ?Vitals:  ? 03/29/22 0816 03/29/22 1210 03/29/22 1221 03/29/22 1300  ?BP:  (!) 152/78 (!) 160/75 130/73  ?Pulse:  82 82 89  ?Resp:  12 12 16   ?Temp:  98 ?F (36.7 ?C)    ?TempSrc:  Oral    ?SpO2: 100% 100%    ?Weight:  82.3 kg    ?Height:      ? ? ?Intake/Output Summary (Last 24 hours) at 03/29/2022 1332 ?Last data filed at 03/29/2022 0900 ?Gross per  24 hour  ?Intake 960 ml  ?Output --  ?Net 960 ml  ? ?Filed Weights  ? 03/28/22 0500 03/29/22 0505 03/29/22 1210  ?Weight: 81.1 kg 82.3 kg 82.3 kg  ? ? ? ? ?Physical Exam: ?  ?General:  AAO x 3,  coope

## 2022-03-29 NOTE — Progress Notes (Signed)
Pharmacy Antibiotic Note ? ?PRISTINE GLADHILL is a 71 y.o. female admitted on 03/26/2022 with pneumonia.  Pharmacy has been consulted for Zosyn dosing. Discussed with MD - pt with PCN allergy however has tolerated cephalosporins in the past. Will switch to cefepime. Dosed with HD on TTS, patient getting an extra session today ? ?Plan: ?Cefepime 2gm IV qHD (TTS) ?Extra dose with HD session today ?Monitor cultures and clinical status ? ?Temp (24hrs), Avg:98 ?F (36.7 ?C), Min:97.9 ?F (36.6 ?C), Max:98 ?F (36.7 ?C) ? ?Recent Labs  ?Lab 03/26/22 ?1407 03/27/22 ?7017 03/28/22 ?0900 03/29/22 ?0416 03/29/22 ?0706  ?WBC 15.2* 14.0* 20.9* 21.0*  --   ?CREATININE 6.72* 8.02* 5.96* 7.68*  --   ?LATICACIDVEN  --   --   --   --  1.3  ?  ?Estimated Creatinine Clearance: 7 mL/min (A) (by C-G formula based on SCr of 7.68 mg/dL (H)).   ? ?Allergies  ?Allergen Reactions  ? Azithromycin   ?  Prolonged QT  ? Ace Inhibitors Cough  ? Clopidogrel Bisulfate Other (See Comments)  ?  Sick, Headache, "Felt terrible"  ? Codeine Other (See Comments)  ?  hallucinations  ? Lisinopril Cough  ? Penicillins Other (See Comments)  ?  Unknown reaction ?Did it involve swelling of the face/tongue/throat, SOB, or low BP? Unknown ?Did it involve sudden or severe rash/hives, skin peeling, or any reaction on the inside of your mouth or nose? Unknown ?Did you need to seek medical attention at a hospital or doctor's office? Unknown ?When did it last happen?      childhood allergy ?If all above answers are "NO", may proceed with cephalosporin use. ? ?  ? ? ?Antimicrobials this admission: ?4/23 Cefepime x1 ?4/23 Vanc x1 ? ?Microbiology results: ?4/23 Blood cx ngtd ? ?Thank you for allowing pharmacy to be a part of this patient?s care. ? ?Lorenso Courier, PharmD ?Clinical Pharmacist ?03/29/2022 10:34 AM ? ? ? ? ? ? ?

## 2022-03-29 NOTE — Progress Notes (Signed)
Patient ID: [name], female   DOB: September 20, 1951, 71 y.o.   MRN: [id number] ?S: no acute events. She is inquiring about getting home oxygen. Dressing applied to RUE AVF by VVS. She feels as if breathing is slowly improving. ? ?O:BP (!) 142/74 (BP Location: Left Wrist)   Pulse 79   Temp 98 ?F (36.7 ?C)   Resp 19   Ht 5\' 3"  (1.6 m)   Wt 82.3 kg   SpO2 100%   BMI 32.14 kg/m?  ? ?Intake/Output Summary (Last 24 hours) at 03/29/2022 0900 ?Last data filed at 03/29/2022 0500 ?Gross per 24 hour  ?Intake 960 ml  ?Output --  ?Net 960 ml  ? ?Intake/Output: ?I/O last 3 completed shifts: ?In: 1440 [P.O.:1440] ?Out: 3000 [Other:3000] ? Intake/Output this shift: ? No intake/output data recorded. ?Weight change: -2.3 kg ?Gen:NAD ?CVS: IRR IRR ?Resp: b/l end exp wheeze ?Abd: +BS, soft, NT/ND ?Ext: no edema LUE AVF +T/B: dressing in place ? ?Recent Labs  ?Lab 03/26/22 ?1407 03/27/22 ?3875 03/28/22 ?0900 03/29/22 ?0416  ?NA 131* 130* 131* 131*  ?K 3.2* 4.3 4.7 4.9  ?CL 90* 92* 92* 92*  ?CO2 27 21* 25 24  ?GLUCOSE 168* 210* 350* 104*  ?BUN 22 33* 35* 54*  ?CREATININE 6.72* 8.02* 5.96* 7.68*  ?ALBUMIN 3.9  --  3.7 3.4*  ?CALCIUM 8.6* 8.4* 9.3 9.1  ?PHOS  --   --  5.0* 5.5*  ?AST 20  --   --   --   ?ALT 18  --   --   --   ? ?Liver Function Tests: ?Recent Labs  ?Lab 03/26/22 ?1407 03/28/22 ?0900 03/29/22 ?0416  ?AST 20  --   --   ?ALT 18  --   --   ?ALKPHOS 68  --   --   ?BILITOT 0.6  --   --   ?PROT 7.1  --   --   ?ALBUMIN 3.9 3.7 3.4*  ? ?No results for input(s): LIPASE, AMYLASE in the last 168 hours. ?No results for input(s): AMMONIA in the last 168 hours. ?CBC: ?Recent Labs  ?Lab 03/26/22 ?1407 03/27/22 ?6433 03/28/22 ?0900 03/29/22 ?0416  ?WBC 15.2* 14.0* 20.9* 21.0*  ?HGB 9.8* 8.9* 9.2* 8.9*  ?HCT 30.8* 28.5* 27.8* 27.3*  ?MCV 90.3 89.6 86.9 88.3  ?PLT 240 223 276 274  ? ?Cardiac Enzymes: ?No results for input(s): CKTOTAL, CKMB, CKMBINDEX, TROPONINI in the last 168 hours. ?CBG: ?Recent Labs  ?Lab 03/28/22 ?0726 03/28/22 ?1131  03/28/22 ?1615 03/28/22 ?2059 03/29/22 ?2951  ?GLUCAP 279* 407* 236* 154* 155*  ? ? ?Iron Studies: No results for input(s): IRON, TIBC, TRANSFERRIN, FERRITIN in the last 72 hours. ?Studies/Results: ?DG Chest 2 View ? ?Result Date: 03/29/2022 ?CLINICAL DATA:  Weakness and intermittent shortness of breath. History of CAD COPD, hypertension, CHF stroke and former smoker. EXAM: CHEST - 2 VIEW COMPARISON:  Radiograph March 26, 2022 FINDINGS: Similar cardiac enlargement and central vascular prominence. Aortic atherosclerosis. Chronic parenchymal lung changes. Left lower lobe airspace opacity with partial obscuration of the hemidiaphragm. No visible pleural effusion or pneumothorax. Partially visualized anterior spinal fusion hardware. IMPRESSION: 1. Left lower lobe airspace opacity which may reflect atelectasis or pneumonia. 2. Similar cardiac enlargement and central vascular prominence. Electronically Signed   By: Dahlia Bailiff M.D.   On: 03/29/2022 08:11  ? ?NM Pulmonary Perfusion ? ?Result Date: 03/28/2022 ?CLINICAL DATA:  Concern for pulmonary embolus EXAM: NUCLEAR MEDICINE PERFUSION LUNG SCAN TECHNIQUE: Perfusion images were obtained in multiple projections after  intravenous injection of radiopharmaceutical. Ventilation scans intentionally deferred if perfusion scan and chest x-ray adequate for interpretation during COVID 19 epidemic. RADIOPHARMACEUTICALS:  4.4 mCi Tc-42m MAA IV COMPARISON:  Chest x-ray dated March 26, 2022 FINDINGS: No segmental perfusion defects. IMPRESSION: Pulmonary embolus absent. Electronically Signed   By: Yetta Glassman M.D.   On: 03/28/2022 14:19   ? ? apixaban  5 mg Oral BID  ? atenolol  75 mg Oral QHS  ? calcium acetate  1,334 mg Oral TID WC  ? Chlorhexidine Gluconate Cloth  6 each Topical Daily  ? Chlorhexidine Gluconate Cloth  6 each Topical Q0600  ? dextromethorphan-guaiFENesin  1 tablet Oral BID  ? diltiazem  240 mg Oral Daily  ? famotidine  20 mg Oral QHS  ? gabapentin  800 mg Oral Q  M,W,F-1800  ? insulin aspart  0-15 Units Subcutaneous TID WC  ? insulin aspart  0-5 Units Subcutaneous QHS  ? insulin aspart  3 Units Subcutaneous TID WC  ? insulin glargine-yfgn  32 Units Subcutaneous QHS  ? ipratropium-albuterol  3 mL Nebulization Q6H  ? mometasone-formoterol  2 puff Inhalation BID  ? QUEtiapine  200 mg Oral QHS  ? rOPINIRole  1 mg Oral QHS  ? rosuvastatin  5 mg Oral Daily  ? sodium chloride flush  3 mL Intravenous Q12H  ? sodium chloride flush  3 mL Intravenous Q12H  ? ticagrelor  90 mg Oral BID  ? ? ?BMET ?   ?Component Value Date/Time  ? NA 131 (L) 03/29/2022 0416  ? NA 137 03/17/2022 1136  ? K 4.9 03/29/2022 0416  ? CL 92 (L) 03/29/2022 0416  ? CO2 24 03/29/2022 0416  ? GLUCOSE 104 (H) 03/29/2022 0416  ? BUN 54 (H) 03/29/2022 0416  ? BUN 21 03/17/2022 1136  ? CREATININE 7.68 (H) 03/29/2022 0416  ? CREATININE 1.66 (H) 03/25/2013 1026  ? CALCIUM 9.1 03/29/2022 0416  ? GFRNONAA 5 (L) 03/29/2022 0416  ? GFRAA 4 (L) 11/01/2020 1022  ? ?CBC ?   ?Component Value Date/Time  ? WBC 21.0 (H) 03/29/2022 0416  ? RBC 3.09 (L) 03/29/2022 0416  ? HGB 8.9 (L) 03/29/2022 0416  ? HGB 8.9 (LL) 03/17/2022 1136  ? HCT 27.3 (L) 03/29/2022 0416  ? HCT 27.8 (L) 03/17/2022 1136  ? PLT 274 03/29/2022 0416  ? PLT 232 03/17/2022 1136  ? MCV 88.3 03/29/2022 0416  ? MCV 88 03/17/2022 1136  ? MCH 28.8 03/29/2022 0416  ? MCHC 32.6 03/29/2022 0416  ? RDW 18.0 (H) 03/29/2022 0416  ? RDW 16.1 (H) 03/17/2022 1136  ? LYMPHSABS 1.8 03/17/2022 1136  ? MONOABS 0.6 03/05/2022 0740  ? EOSABS 0.5 (H) 03/17/2022 1136  ? BASOSABS 0.1 03/17/2022 1136  ? ?Dialysis Orders: Center: [facility]  on TTS . ?EDW 82 kg HD Bath 1K/2.5Ca  Time 4 hours Heparin 1000 units bolus then 1000 unts/hr. Access RUE AVF BFR 350 DFR 500    ?Calcitriol 2.25 mcg po/HD Micera 50 mcg IVP every 2 weeks  ?  ?  ?Assessment/Plan: ? Atrial fibrillation with RVR -  On cardizem PO, Eliquis, and atenolol.  cardiology on board, rate controlled ? Acute on chronic diastolic  CHF - elevated BNP and likely due to a fib with RVR.   ECHO also noted moderate RV dysfunction and pulmonary HTN.  ? ESRD -  Normally runs TTS. HD today, back on schedule tomorrow. Will need new EDW after discharge ? Bleeding from RUE AVF - has been  an issue since starting Brilinta following cardiac cath on 03/07/22.  S/p fistulogram on 03/14/22 with 80% stenosis of cephalic vein s/p PTA with 30% residual stenosis.  Will need to be on brilinta given recent PCI, could consider holding eliquis as per cardiology. VVS evaluated, if continues to be a problem, then will need to consider placing TDC instead. ? Hypertension/volume  - UF as tolerated ? Anemia  - continue with ESA and follow H/H and iron stores. ? Metabolic bone disease -  continue with home meds ? Nutrition - renal diet, carb modified ? CAD s/p recent NSTEMI and PTCA and DES to RCA and LAD - on 03/06/22.  Cardiology following. ? COPD - likely contributing to her SOB as she is feeling better after nebulizer and UF. ? ?Gean Quint, MD ?[facility] ? ?

## 2022-03-29 NOTE — Progress Notes (Signed)
? ?Progress Note ? ?Patient Name: [name] ?Date of Encounter: 03/29/2022 ? ?Primary Cardiologist: Minus Breeding, MD ? ?Subjective  ? ?In bedside chair.  States that she feels weak today.  Also chest sore from intermittent coughing. ? ?Inpatient Medications  ?  ?Scheduled Meds: ? apixaban  5 mg Oral BID  ? atenolol  75 mg Oral QHS  ? calcium acetate  1,334 mg Oral TID WC  ? Chlorhexidine Gluconate Cloth  6 each Topical Daily  ? Chlorhexidine Gluconate Cloth  6 each Topical Q0600  ? diltiazem  240 mg Oral Daily  ? famotidine  20 mg Oral QHS  ? gabapentin  800 mg Oral Q M,W,F-1800  ? guaiFENesin  10 mL Oral Q8H  ? insulin aspart  0-15 Units Subcutaneous TID WC  ? insulin aspart  0-5 Units Subcutaneous QHS  ? insulin aspart  3 Units Subcutaneous TID WC  ? insulin glargine-yfgn  32 Units Subcutaneous QHS  ? ipratropium-albuterol  3 mL Nebulization Q6H  ? mometasone-formoterol  2 puff Inhalation BID  ? QUEtiapine  200 mg Oral QHS  ? rOPINIRole  1 mg Oral QHS  ? rosuvastatin  5 mg Oral Daily  ? sodium chloride flush  3 mL Intravenous Q12H  ? sodium chloride flush  3 mL Intravenous Q12H  ? ticagrelor  90 mg Oral BID  ? ?Continuous Infusions: ? sodium chloride    ? sodium chloride    ? ?PRN Meds: ?sodium chloride, sodium chloride, acetaminophen **OR** acetaminophen, albuterol, alteplase, bisacodyl, calcium acetate **AND** calcium acetate, dextromethorphan-guaiFENesin, heparin, heparin, hydrALAZINE, hydrOXYzine, lidocaine (PF), lidocaine-prilocaine, ondansetron **OR** ondansetron (ZOFRAN) IV, pentafluoroprop-tetrafluoroeth, polyethylene glycol, sodium chloride flush  ? ?Vital Signs  ?  ?Vitals:  ? 03/28/22 2057 03/29/22 0505 03/29/22 0815 03/29/22 0816  ?BP: 136/71 (!) 142/74    ?Pulse: 80 79    ?Resp: 20 19    ?Temp: 98 ?F (36.7 ?C) 98 ?F (36.7 ?C)    ?TempSrc: Oral     ?SpO2: 100% 94% 96% 100%  ?Weight:  82.3 kg    ?Height:      ? ? ?Intake/Output Summary (Last 24 hours) at 03/29/2022 1025 ?Last data filed at  03/29/2022 0500 ?Gross per 24 hour  ?Intake 960 ml  ?Output --  ?Net 960 ml  ? ?Filed Weights  ? 03/27/22 2328 03/28/22 0500 03/29/22 0505  ?Weight: 81.4 kg 81.1 kg 82.3 kg  ? ? ?Telemetry  ?  ?Atrial fibrillation.  Personally reviewed. ? ?ECG  ?  ?No ECG reviewed. ? ?Physical Exam  ? ?GEN: No acute distress.   ?Neck: No JVD. ?Cardiac: Irregularly irregular, no gallop.  ?Respiratory: Nonlabored.  Expiratory wheezes. ?GI: Soft, nontender, bowel sounds present. ?MS: No edema; compression stockings in place.  No deformity. ?Neuro:  Nonfocal. ?Psych: Alert and oriented x 3. Normal affect. ? ?Labs  ?  ?Chemistry ?Recent Labs  ?Lab 03/26/22 ?1407 03/27/22 ?9629 03/28/22 ?0900 03/29/22 ?0416  ?NA 131* 130* 131* 131*  ?K 3.2* 4.3 4.7 4.9  ?CL 90* 92* 92* 92*  ?CO2 27 21* 25 24  ?GLUCOSE 168* 210* 350* 104*  ?BUN 22 33* 35* 54*  ?CREATININE 6.72* 8.02* 5.96* 7.68*  ?CALCIUM 8.6* 8.4* 9.3 9.1  ?PROT 7.1  --   --   --   ?ALBUMIN 3.9  --  3.7 3.4*  ?AST 20  --   --   --   ?ALT 18  --   --   --   ?ALKPHOS 68  --   --   --   ?  BILITOT 0.6  --   --   --   ?GFRNONAA 6* 5* 7* 5*  ?ANIONGAP 14 17* 14 15  ?  ? ?Hematology ?Recent Labs  ?Lab 03/27/22 ?6644 03/28/22 ?0900 03/29/22 ?0416  ?WBC 14.0* 20.9* 21.0*  ?RBC 3.18* 3.20* 3.09*  ?HGB 8.9* 9.2* 8.9*  ?HCT 28.5* 27.8* 27.3*  ?MCV 89.6 86.9 88.3  ?MCH 28.0 28.8 28.8  ?MCHC 31.2 33.1 32.6  ?RDW 17.5* 17.9* 18.0*  ?PLT 223 276 274  ? ? ?Cardiac Enzymes ?Recent Labs  ?Lab 03/04/22 ?2246 03/05/22 ?0323 03/26/22 ?1407 03/26/22 ?1630 03/27/22 ?0347  ?TROPONINIHS 105* 181* 77* 84* 81*  ? ? ?BNP ?Recent Labs  ?Lab 03/26/22 ?1407  ?BNP 2,276.0*  ?  ? ?Radiology  ?  ?DG Chest 2 View ? ?Result Date: 03/29/2022 ?CLINICAL DATA:  Weakness and intermittent shortness of breath. History of CAD COPD, hypertension, CHF stroke and former smoker. EXAM: CHEST - 2 VIEW COMPARISON:  Radiograph March 26, 2022 FINDINGS: Similar cardiac enlargement and central vascular prominence. Aortic atherosclerosis. Chronic  parenchymal lung changes. Left lower lobe airspace opacity with partial obscuration of the hemidiaphragm. No visible pleural effusion or pneumothorax. Partially visualized anterior spinal fusion hardware. IMPRESSION: 1. Left lower lobe airspace opacity which may reflect atelectasis or pneumonia. 2. Similar cardiac enlargement and central vascular prominence. Electronically Signed   By: Dahlia Bailiff M.D.   On: 03/29/2022 08:11  ? ?NM Pulmonary Perfusion ? ?Result Date: 03/28/2022 ?CLINICAL DATA:  Concern for pulmonary embolus EXAM: NUCLEAR MEDICINE PERFUSION LUNG SCAN TECHNIQUE: Perfusion images were obtained in multiple projections after intravenous injection of radiopharmaceutical. Ventilation scans intentionally deferred if perfusion scan and chest x-ray adequate for interpretation during COVID 19 epidemic. RADIOPHARMACEUTICALS:  4.4 mCi Tc-21m MAA IV COMPARISON:  Chest x-ray dated March 26, 2022 FINDINGS: No segmental perfusion defects. IMPRESSION: Pulmonary embolus absent. Electronically Signed   By: Yetta Glassman M.D.   On: 03/28/2022 14:19   ? ?Cardiac Studies  ? ?Echocardiogram 03/05/2022: ? 1. Left ventricular ejection fraction, by estimation, is 55%. The left  ?ventricle has low normal function. The left ventricle has no regional wall  ?motion abnormalities. There is mild left ventricular hypertrophy. Left  ?ventricular diastolic parameters  ?are indeterminate.  ? 2. Compared to prior study, RV dysfunction is new  ?    [name] Right ventricular systolic function moderate to severely decreased.  ?The right ventricular size is mildly enlarged. There is severely elevated  ?pulmonary artery systolic pressure.  ? 3. Left atrial size was mildly dilated.  ? 4. Right atrial size was mildly dilated.  ? 5. A small pericardial effusion is present.  ? 6. Mild mitral valve regurgitation.  ? 7. The aortic valve is tricuspid. Aortic valve regurgitation is not  ?visualized. Aortic valve sclerosis is present, with no evidence  of aortic  ?valve stenosis.  ? 8. The inferior vena cava is dilated in size with <50% respiratory  ?variability, suggesting right atrial pressure of 15 mmHg.  ? ?Coronary PCI 03/08/2022: ?1.  Successful atherectomy, PTCA, and stenting of severe stenosis in the mid LAD, reducing 90% calcific stenosis to 0%, TIMI-3 flow pre and post, with a 3.0 x 18 mm Onyx frontier DES ?2.  Successful atherectomy, PTCA, and stenting of severe stenosis in the mid RCA, reducing 95% calcific stenosis to 0%, TIMI-3 flow pre and post, with a 3.0 x 34 mm Onyx frontier DES ?  ?Recommendations: Patient with atrial fibrillation, significant comorbidities of end-stage renal disease.  Considering balance of  antithrombotic efficacy and concern over bleeding risk with multidrug therapy, favor the following approach: ?Resume apixaban tomorrow at normal dosing schedule, no heparin overnight ?Ticagrelor 90 mg twice daily x3 months ?Aspirin 81 mg daily long-term after 3 months of ticagrelor completed ? ?Assessment & Plan  ?  ?1.  Persistent atrial fibrillation, CHA2DS2-VASc score is 6.  Focus is on heart rate control at this time along with anticoagulation.  Blood pressure has been stable on combination of atenolol and Cardizem CD.  She is on Eliquis for stroke prophylaxis. ? ?2.  CAD status post NSTEMI in early April and status post atherectomy/PTCA with DES intervention to the mid LAD has atherectomy/PTCA and DES intervention to the mid RCA.  She is on Brilinta given recent PCI with plan to transition to aspirin after 47-month since she is also on Eliquis. ? ?3.  ESRD on hemodialysis. ? ?4.  HFpEF, LVEF 55%.  Fluid status managed via hemodialysis. ? ?5.  Severe pulmonary hypertension with RV dysfunction, estimated RVSP 62 mmHg.  Most likely combination of WHO group 2 and 3.  VQ scan negative for pulmonary embolus. ? ?She anticipates hemodialysis session today.  Continue Eliquis, Brilinta, Crestor, Cardizem CD, and atenolol.  Recent heart rate control  of atrial fibrillation reasonable. ? ?Signed, ?Rozann Lesches, MD  ?03/29/2022, 10:25 AM    ?

## 2022-03-29 NOTE — Progress Notes (Signed)
Overnight oximetry removed ?

## 2022-03-29 NOTE — Procedures (Signed)
Tx completed without complications.  Net UF removed 2000 ml. NAD, VSS. Applied pressure dressing over normal bandages to fistula.  2 Pressure dressings on top of bandages can be removed in 4 hours. ?

## 2022-03-30 DIAGNOSIS — I25119 Atherosclerotic heart disease of native coronary artery with unspecified angina pectoris: Secondary | ICD-10-CM | POA: Diagnosis not present

## 2022-03-30 DIAGNOSIS — I4819 Other persistent atrial fibrillation: Secondary | ICD-10-CM | POA: Diagnosis not present

## 2022-03-30 DIAGNOSIS — I4891 Unspecified atrial fibrillation: Secondary | ICD-10-CM | POA: Diagnosis not present

## 2022-03-30 LAB — BASIC METABOLIC PANEL
Anion gap: 10 (ref 5–15)
BUN: 35 mg/dL — ABNORMAL HIGH (ref 8–23)
CO2: 31 mmol/L (ref 22–32)
Calcium: 8.8 mg/dL — ABNORMAL LOW (ref 8.9–10.3)
Chloride: 94 mmol/L — ABNORMAL LOW (ref 98–111)
Creatinine, Ser: 5.61 mg/dL — ABNORMAL HIGH (ref 0.44–1.00)
GFR, Estimated: 8 mL/min — ABNORMAL LOW (ref >60)
Glucose, Bld: 83 mg/dL (ref 70–99)
Potassium: 4.1 mmol/L (ref 3.5–5.1)
Sodium: 135 mmol/L (ref 135–145)

## 2022-03-30 LAB — PROCALCITONIN: Procalcitonin: 2.04 ng/mL

## 2022-03-30 LAB — GLUCOSE, CAPILLARY
Glucose-Capillary: 65 mg/dL — ABNORMAL LOW (ref 70–99)
Glucose-Capillary: 218 mg/dL — ABNORMAL HIGH (ref 70–99)
Glucose-Capillary: 84 mg/dL (ref 70–99)
Glucose-Capillary: 156 mg/dL — ABNORMAL HIGH (ref 70–99)

## 2022-03-30 LAB — CBC
HCT: 30 % — ABNORMAL LOW (ref 36.0–46.0)
Hemoglobin: 9.2 g/dL — ABNORMAL LOW (ref 12.0–15.0)
MCH: 27.5 pg (ref 26.0–34.0)
MCHC: 30.7 g/dL (ref 30.0–36.0)
MCV: 89.8 fL (ref 80.0–100.0)
Platelets: 296 10*3/uL (ref 150–400)
RBC: 3.34 MIL/uL — ABNORMAL LOW (ref 3.87–5.11)
RDW: 18.4 % — ABNORMAL HIGH (ref 11.5–15.5)
WBC: 15.3 10*3/uL — ABNORMAL HIGH (ref 4.0–10.5)
nRBC: 2.9 % — ABNORMAL HIGH (ref 0.0–0.2)

## 2022-03-30 MED ORDER — ATENOLOL 25 MG PO TABS
100.0000 mg | ORAL_TABLET | Freq: Every day | ORAL | Status: DC
  Administered 2022-03-30: 100 mg via ORAL
  Filled 2022-03-30: qty 4

## 2022-03-30 MED ORDER — IPRATROPIUM-ALBUTEROL 0.5-2.5 (3) MG/3ML IN SOLN
3.0000 mL | Freq: Four times a day (QID) | RESPIRATORY_TRACT | Status: DC
  Administered 2022-03-31 (×2): 3 mL via RESPIRATORY_TRACT
  Filled 2022-03-30 (×2): qty 3

## 2022-03-30 MED ORDER — DARBEPOETIN ALFA 60 MCG/0.3ML IJ SOSY
60.0000 ug | PREFILLED_SYRINGE | INTRAMUSCULAR | Status: DC
Start: 2022-03-30 — End: 2022-03-31
  Filled 2022-03-30: qty 0.3

## 2022-03-30 MED ORDER — DARBEPOETIN ALFA 60 MCG/0.3ML IJ SOSY
PREFILLED_SYRINGE | INTRAMUSCULAR | Status: AC
  Filled 2022-03-30: qty 0.3

## 2022-03-30 NOTE — Progress Notes (Signed)
Patient ID: [name], female   DOB: 29-May-1951, 71 y.o.   MRN: [id number] ?S: no acute events. Tolerated HD yesterday with net UF 2L. Pressure dressings applied with instructions to have it removed 4 hours thereafter, however patient did not want the dressing removed due to fear of re-bleeding. Has the same dressing on and there have been no reports of bleeding and dressing is not soaked. She reports that her breathing is about the same, not worse but not better. She reports that she has congestion in her chest. Reaching ~EDW. ? ?O:BP 138/68 (BP Location: Left Arm)   Pulse 77   Temp 97.7 ?F (36.5 ?C)   Resp 18   Ht 5\' 3"  (1.6 m)   Wt 81.6 kg   SpO2 100%   BMI 31.87 kg/m?  ? ?Intake/Output Summary (Last 24 hours) at 03/30/2022 0820 ?Last data filed at 03/30/2022 0535 ?Gross per 24 hour  ?Intake 900 ml  ?Output 2000 ml  ?Net -1100 ml  ? ?Intake/Output: ?I/O last 3 completed shifts: ?In: 1380 [P.O.:1380] ?Out: 2000 [Other:2000] ? Intake/Output this shift: ? No intake/output data recorded. ?Weight change: 0 kg ?Gen:NAD ?CVS: IRR IRR ?Resp: cta bl, no w/r/r/c ?Abd: +BS, soft, NT/ND ?Ext: no edema LUE AVF +T/B: dressing in place without blood soaked guaze ? ?Recent Labs  ?Lab 03/26/22 ?1407 03/27/22 ?5176 03/28/22 ?0900 03/29/22 ?0416 03/30/22 ?0350  ?NA 131* 130* 131* 131* 135  ?K 3.2* 4.3 4.7 4.9 4.1  ?CL 90* 92* 92* 92* 94*  ?CO2 27 21* 25 24 31   ?GLUCOSE 168* 210* 350* 104* 83  ?BUN 22 33* 35* 54* 35*  ?CREATININE 6.72* 8.02* 5.96* 7.68* 5.61*  ?ALBUMIN 3.9  --  3.7 3.4*  --   ?CALCIUM 8.6* 8.4* 9.3 9.1 8.8*  ?PHOS  --   --  5.0* 5.5*  --   ?AST 20  --   --   --   --   ?ALT 18  --   --   --   --   ? ?Liver Function Tests: ?Recent Labs  ?Lab 03/26/22 ?1407 03/28/22 ?0900 03/29/22 ?0416  ?AST 20  --   --   ?ALT 18  --   --   ?ALKPHOS 68  --   --   ?BILITOT 0.6  --   --   ?PROT 7.1  --   --   ?ALBUMIN 3.9 3.7 3.4*  ? ?No results for input(s): LIPASE, AMYLASE in the last 168 hours. ?No results for input(s):  AMMONIA in the last 168 hours. ?CBC: ?Recent Labs  ?Lab 03/26/22 ?1407 03/27/22 ?1607 03/28/22 ?0900 03/29/22 ?0416 03/30/22 ?0350  ?WBC 15.2* 14.0* 20.9* 21.0* 15.3*  ?HGB 9.8* 8.9* 9.2* 8.9* 9.2*  ?HCT 30.8* 28.5* 27.8* 27.3* 30.0*  ?MCV 90.3 89.6 86.9 88.3 89.8  ?PLT 240 223 276 274 296  ? ?Cardiac Enzymes: ?No results for input(s): CKTOTAL, CKMB, CKMBINDEX, TROPONINI in the last 168 hours. ?CBG: ?Recent Labs  ?Lab 03/29/22 ?0713 03/29/22 ?1115 03/29/22 ?1659 03/29/22 ?2116 03/30/22 ?3710  ?GLUCAP 155* 147* 80 252* 65*  ? ? ?Iron Studies: No results for input(s): IRON, TIBC, TRANSFERRIN, FERRITIN in the last 72 hours. ?Studies/Results: ?DG Chest 2 View ? ?Result Date: 03/29/2022 ?CLINICAL DATA:  Weakness and intermittent shortness of breath. History of CAD COPD, hypertension, CHF stroke and former smoker. EXAM: CHEST - 2 VIEW COMPARISON:  Radiograph March 26, 2022 FINDINGS: Similar cardiac enlargement and central vascular prominence. Aortic atherosclerosis. Chronic parenchymal lung changes. Left lower lobe  airspace opacity with partial obscuration of the hemidiaphragm. No visible pleural effusion or pneumothorax. Partially visualized anterior spinal fusion hardware. IMPRESSION: 1. Left lower lobe airspace opacity which may reflect atelectasis or pneumonia. 2. Similar cardiac enlargement and central vascular prominence. Electronically Signed   By: Dahlia Bailiff M.D.   On: 03/29/2022 08:11  ? ?NM Pulmonary Perfusion ? ?Result Date: 03/28/2022 ?CLINICAL DATA:  Concern for pulmonary embolus EXAM: NUCLEAR MEDICINE PERFUSION LUNG SCAN TECHNIQUE: Perfusion images were obtained in multiple projections after intravenous injection of radiopharmaceutical. Ventilation scans intentionally deferred if perfusion scan and chest x-ray adequate for interpretation during COVID 19 epidemic. RADIOPHARMACEUTICALS:  4.4 mCi Tc-40m MAA IV COMPARISON:  Chest x-ray dated March 26, 2022 FINDINGS: No segmental perfusion defects. IMPRESSION:  Pulmonary embolus absent. Electronically Signed   By: Yetta Glassman M.D.   On: 03/28/2022 14:19   ? ? apixaban  5 mg Oral BID  ? atenolol  75 mg Oral QHS  ? calcium acetate  1,334 mg Oral TID WC  ? Chlorhexidine Gluconate Cloth  6 each Topical Daily  ? Chlorhexidine Gluconate Cloth  6 each Topical Q0600  ? diltiazem  240 mg Oral Daily  ? famotidine  20 mg Oral QHS  ? gabapentin  800 mg Oral Q M,W,F-1800  ? guaiFENesin  10 mL Oral Q8H  ? insulin aspart  0-15 Units Subcutaneous TID WC  ? insulin aspart  0-5 Units Subcutaneous QHS  ? insulin aspart  3 Units Subcutaneous TID WC  ? insulin glargine-yfgn  32 Units Subcutaneous QHS  ? ipratropium-albuterol  3 mL Nebulization Q6H  ? mometasone-formoterol  2 puff Inhalation BID  ? QUEtiapine  200 mg Oral QHS  ? rOPINIRole  1 mg Oral QHS  ? rosuvastatin  5 mg Oral Daily  ? sodium chloride flush  3 mL Intravenous Q12H  ? ticagrelor  90 mg Oral BID  ? ? ?BMET ?   ?Component Value Date/Time  ? NA 135 03/30/2022 0350  ? NA 137 03/17/2022 1136  ? K 4.1 03/30/2022 0350  ? CL 94 (L) 03/30/2022 0350  ? CO2 31 03/30/2022 0350  ? GLUCOSE 83 03/30/2022 0350  ? BUN 35 (H) 03/30/2022 0350  ? BUN 21 03/17/2022 1136  ? CREATININE 5.61 (H) 03/30/2022 0350  ? CREATININE 1.66 (H) 03/25/2013 1026  ? CALCIUM 8.8 (L) 03/30/2022 0350  ? GFRNONAA 8 (L) 03/30/2022 0350  ? GFRAA 4 (L) 11/01/2020 1022  ? ?CBC ?   ?Component Value Date/Time  ? WBC 15.3 (H) 03/30/2022 0350  ? RBC 3.34 (L) 03/30/2022 0350  ? HGB 9.2 (L) 03/30/2022 0350  ? HGB 8.9 (LL) 03/17/2022 1136  ? HCT 30.0 (L) 03/30/2022 0350  ? HCT 27.8 (L) 03/17/2022 1136  ? PLT 296 03/30/2022 0350  ? PLT 232 03/17/2022 1136  ? MCV 89.8 03/30/2022 0350  ? MCV 88 03/17/2022 1136  ? MCH 27.5 03/30/2022 0350  ? MCHC 30.7 03/30/2022 0350  ? RDW 18.4 (H) 03/30/2022 0350  ? RDW 16.1 (H) 03/17/2022 1136  ? LYMPHSABS 1.8 03/17/2022 1136  ? MONOABS 0.6 03/05/2022 0740  ? EOSABS 0.5 (H) 03/17/2022 1136  ? BASOSABS 0.1 03/17/2022 1136  ? ?Dialysis  Orders: Center: [facility]  on TTS . ?EDW 82 kg HD Bath 1K/2.5Ca  Time 4 hours Heparin 1000 units bolus then 1000 unts/hr. Access RUE AVF BFR 350 DFR 500    ?Calcitriol 2.25 mcg po/HD Micera 50 mcg IVP every 2 weeks  ?  ?  ?Assessment/Plan: ?  Atrial fibrillation with RVR -  On cardizem PO, Eliquis, and atenolol.  cardiology on board, rate controlled ? Acute on chronic diastolic CHF - elevated BNP and likely due to a fib with RVR.   ECHO also noted moderate RV dysfunction and pulmonary HTN. . UF as tolerated ? ESRD -  Normally runs TTS. Back on schedule today ? Bleeding from RUE AVF - has been an issue since starting Brilinta following cardiac cath on 03/07/22.  S/p fistulogram on 03/14/22 with 80% stenosis of cephalic vein s/p PTA with 30% residual stenosis.  Will need to be on brilinta given recent PCI, could consider holding eliquis as per cardiology (however would be at increased risk for thromboembolic events from afib). VVS evaluated, if continues to be a problem, then will need to consider placing TDC instead. ? Hypertension/volume  - UF as tolerated ? Anemia  - continue with ESA (dose 03/30/22) and follow H/H. Iron panel ordered ? Metabolic bone disease -  continue with home meds ? Nutrition - renal diet, carb modified ? CAD s/p recent NSTEMI and PTCA and DES to RCA and LAD - on 03/06/22.  Cardiology following. ? COPD - likely contributing to her current SOB. Mgmt per primary service. ? ?Gean Quint, MD ?[facility] ? ?

## 2022-03-30 NOTE — Procedures (Signed)
Tx completed without complications. Size 16 needle used. Blood returned per policy. NAD VSS NET UF removed, 2L. Pressure dressing applied to fistula, instructed to remove dressing in 4 hours.  Failure to remove will cause bruising and possibly clotting of fistula.  Pt agreed to remove as instructed. ?

## 2022-03-30 NOTE — Progress Notes (Signed)
?PROGRESS NOTE ? ? ? ? ?[name], is a 71 y.o. female, DOB - Oct 09, 1951, PNT:[id number] ? ?Admit date - 03/26/2022   Admitting Physician Courage Denton Brick, MD ? ?Outpatient Primary MD for the patient is [name], MD ? ?LOS - 4 ? ?Chief Complaint  ?Patient presents with  ? Chest Pain  ?    ? ? ?Subjective: ?-No further bleeding from right arm fistula ? ? ? ?Brief Narrative:  ?71 y.o. female with past medical history relevant for obesity, HTN, DM2, ESRD on hemodialysis on TTS schedule, COPD, recent diagnosis of paroxysmal A-fib in April 2023, CAD with recent angioplasty and stent placement on 03/08/2022, GERD, and chronic anemia of ESRD admitted on 03/26/2022 with acute on chronic diastolic CHF exacerbation ? ?  ?Assessment and Plan: ? ?1)PAFib with RVR- ?-New diagnosis, first diagnosed earlier this month ?--Treated with IV Cardizem, now on p.o. Cardizem CD 240 mg daily ?-Continue PTA Eliquis  ?-Cardiology recommends increasing Atenolol to 100 mg twice daily ?-Echo from 03/05/2022 with EF of 55% with severe pulmonary hypertension and mildly dilated right and left atrium ?  ?2)HFpEF- ?-- acute on chronic diastolic dysfunction exacerbation in the setting of #1 above ?-Chest x-ray consistent with pulmonary congestion ?-BNP 2276 up from 1600 recently ?-Despite IV Lasix no urine output ?-Consider extra hemodialysis session to address volume status ?-Recent echo as above #1 ?Cardiorespiratory status improved after extra /off schedule hemodialysis on 03/27/2022 ?-Planning for hemodialysis today 03/30/2022 and then resume routine schedule Tuesday Thursday Saturday ? ?3)CAD/Recent NSTEMI- ?-- troponin 77, down from 181 about 3 weeks ago ?-LHC on 03/06/2022 consistent with severe two-vessel disease involving RCA and LAD ?-Status post coronary atherectomy/angioplasty PCI on 03/08/2022 ?-Deemed to be too high risk a candidate for CABG ?-Continue Brilinta, Crestor and Atenolol ?-Per cardiology's plan is to continue Eliquis and  Brilinta for 90 days from 03/08/2022 then switch Brilinta to aspirin 81 mg daily ?  ?4)Leukocytosis---  ?-- patient received vancomycin and cefepime in the ED ?-Chest x-ray suggestive of inflammatory changes possible infection-pneumonia ?-Cultures obtained ?-Empiric IV antibiotic cefepime >> 4/26 initiated with plan for quick taper ?-WBC trending back up suspected also contributed by steroids ?  ?5)Depression/anxiety--continue Seroquel 200 mg nightly along with hydroxyzine as needed anxiety ?  ?6)ESRD--TTS, last HD 03/25/2022 completed 4 hours of HD ?-Metabolic Bone Disease: Continue PhosLo for hyperphosphatemia. ?-Nephrology consult appreciated ?Received extra HD session on 03/27/2022 off schedule ?-Right arm AV fistula has been bleeding since 03/27/22 ?-  She did undergo fistulogram in [place] with Dr. Virl Cagey on 03/14/2022 for bleeding fistula ?-See full consult from vascular surgeon Dr. Sherren Mocha Early dated 03/28/2022--regarding bleeding from right AV fistula ?-Planned  hemodialysis 03/29/2022 and 03/30/2022 ? ?  ?7)DM2-A1c 8.0  ?-reflecting uncontrolled diabetes PTA ?-Continue Lantus , now increased to that patient is off steroids may be able to decrease Lantus ?-Anticipate worsening hyperglycemia with steroids ?Use Novolog/Humalog Sliding scale insulin with Accu-Cheks/Fingersticks as ordered  ?  ?8)HTN-----increase atenolol to 75 mg nightly, and p.o. Cardizem CD 240 mg daily-May use IV hydralazine as needed elevated BP ?  ?9)COPD with pulmonary hypertension--patient with significant dyspnea, cough and slight wheezing ?-Completed  Solu-Medrol , c/n   mucolytics ?-Tapered off supplemental oxygen ?-Be judicious with bronchodilators in the setting of A-fib with RVR ?  ?10) chronic anemia of ESRD---- ?Hgb stable around 9  ?monitor closely especially while on Brilinta and Eliquis ?-Defer decision on ESA/Procrit to nephrology team ?  ?11) hypokalemia/hyponatremia --- magnesium WNL,  ?--Potassium normalized after replacement ?   ?  12) chronic anticoagulation--- given angioplasty and stenting on 03/08/2022 in the setting of A-fib patient was advised to take Eliquis and Brilinta for 3 months through July 2023 and then transition most likely to aspirin ? ?13)Pulm hypertension--- patient wondering if she qualifies for home oxygen ?Get NOD study (Nocturnal oxygen desaturation study)-- ?-She was monitored and tapered off supplemental oxygen, did not qualify for home O2 at this time ?-94% on room air ? ? ?---------------------------------------------------------------------------------------------------------- ? ?  ?  ?Dispo: The patient is from: Home ?             Anticipated d/c is to: Home with home health ?             Anticipated d/c date is: 1-2 days ?             Patient currently is not medically stable to d/c. ? ?Code Status :  -  Code Status: Full Code  ? ? ?Lab Results  ?Component Value Date  ? PLT 296 03/30/2022  ? ? ?Inpatient Medications ? ?Scheduled Meds: ? apixaban  5 mg Oral BID  ? atenolol  100 mg Oral QHS  ? calcium acetate  1,334 mg Oral TID WC  ? Chlorhexidine Gluconate Cloth  6 each Topical Daily  ? Chlorhexidine Gluconate Cloth  6 each Topical Q0600  ? darbepoetin (ARANESP) injection - DIALYSIS  60 mcg Intravenous Q Thu-HD  ? diltiazem  240 mg Oral Daily  ? famotidine  20 mg Oral QHS  ? gabapentin  800 mg Oral Q M,W,F-1800  ? guaiFENesin  10 mL Oral Q8H  ? insulin aspart  0-15 Units Subcutaneous TID WC  ? insulin aspart  0-5 Units Subcutaneous QHS  ? insulin aspart  3 Units Subcutaneous TID WC  ? insulin glargine-yfgn  32 Units Subcutaneous QHS  ? ipratropium-albuterol  3 mL Nebulization Q6H  ? mometasone-formoterol  2 puff Inhalation BID  ? QUEtiapine  200 mg Oral QHS  ? rOPINIRole  1 mg Oral QHS  ? rosuvastatin  5 mg Oral Daily  ? sodium chloride flush  3 mL Intravenous Q12H  ? ticagrelor  90 mg Oral BID  ? ?Continuous Infusions: ? sodium chloride    ? sodium chloride    ? ceFEPime (MAXIPIME) IV 2 g (03/29/22 1506)  ? ?PRN  Meds:.sodium chloride, sodium chloride, acetaminophen **OR** acetaminophen, albuterol, alteplase, bisacodyl, calcium acetate **AND** calcium acetate, dextromethorphan-guaiFENesin, heparin, heparin, hydrALAZINE, hydrOXYzine, lidocaine (PF), lidocaine-prilocaine, ondansetron **OR** ondansetron (ZOFRAN) IV, pentafluoroprop-tetrafluoroeth, polyethylene glycol, sodium chloride flush ? ? ?Anti-infectives (From admission, onward)  ? ? Start     Dose/Rate Route Frequency Ordered Stop  ? 03/29/22 1200  ceFEPIme (MAXIPIME) 2 g in sodium chloride 0.9 % 100 mL IVPB       ? 2 g ?200 mL/hr over 30 Minutes Intravenous Every T-Th-Sa (Hemodialysis) 03/29/22 1030    ? 03/26/22 1515  vancomycin (VANCOREADY) IVPB 1500 mg/300 mL       ? 1,500 mg ?150 mL/hr over 120 Minutes Intravenous  Once 03/26/22 1441 03/26/22 1646  ? 03/26/22 1500  ceFEPIme (MAXIPIME) 1 g in sodium chloride 0.9 % 100 mL IVPB       ? 1 g ?200 mL/hr over 30 Minutes Intravenous  Once 03/26/22 1434 03/26/22 1907  ? ?  ? ? ? ?Objective: ?Vitals:  ? 03/30/22 3419 03/30/22 6222 03/30/22 9798 03/30/22 0834  ?BP:  138/68    ?Pulse:  77    ?Resp:  18    ?Temp:  97.7 ?  F (36.5 ?C)    ?TempSrc:      ?SpO2: 99% 100% 99% 100%  ?Weight:  81.6 kg    ?Height:      ? ? ?Intake/Output Summary (Last 24 hours) at 03/30/2022 1127 ?Last data filed at 03/30/2022 0900 ?Gross per 24 hour  ?Intake 900 ml  ?Output 2000 ml  ?Net -1100 ml  ? ?Filed Weights  ? 03/29/22 0505 03/29/22 1210 03/30/22 0556  ?Weight: 82.3 kg 82.3 kg 81.6 kg  ? ? ? ? ?Physical Exam: ?  ?General:  AAO x 3,  cooperative, no distress;   ?HEENT:  Normocephalic, PERRL, otherwise with in Normal limits   ?Neuro:  CNII-XII intact. , normal motor and sensation, reflexes intact   ?Lungs:   Clear to auscultation BL, Respirations unlabored,  ?No wheezes / crackles  ?Cardio:    S1/S2, RRR, No murmure, No Rubs or Gallops   ?Abdomen:  Soft, non-tender, bowel sounds active all four quadrants, ?no guarding or peritoneal signs.  ?Muscular   ?skeletal:  Limited exam -global generalized weaknesses ?- in bed, able to move all 4 extremities,   ?2+ pulses,  symmetric, No pitting edema  ?Skin:  Dry, warm to touch, negative for any Rashes,  ?Wounds: Ple

## 2022-03-30 NOTE — Progress Notes (Signed)
Inpatient Diabetes Program Recommendations ? ?AACE/ADA: New Consensus Statement on Inpatient Glycemic Control (2015) ? ?Target Ranges:  Prepandial:   less than 140 mg/dL ?     Peak postprandial:   less than 180 mg/dL (1-2 hours) ?     Critically ill patients:  140 - 180 mg/dL  ? ?Lab Results  ?Component Value Date  ? GLUCAP 65 (L) 03/30/2022  ? HGBA1C 8.0 (H) 03/06/2022  ? ? ?Review of Glycemic Control ? ?Diabetes history: DM2 ?Outpatient Diabetes medications: Basaglar 25 units QHS, Ozempic 0.5 mg weekly ?Current orders for Inpatient glycemic control: Semglee 32 units QHS, Novolog 0-15 TID with meals and 0-5 HS + 3 units TID ? ?Hypo this am of 65 ? ?Received meal coverage insulin when eating less than 50% yesterday.  ? ?Inpatient Diabetes Program Recommendations:   ? ?Consider decreasing Semglee to 30 units QHS ? ?Continue to follow. ? ?Thank you. ?Lorenda Peck, RD, LDN, CDE ?Inpatient Diabetes Coordinator ?[phone]  ? ? ? ? ? ? ?

## 2022-03-30 NOTE — TOC Transition Note (Signed)
Transition of Care (TOC) - CM/SW Discharge Note ? ? ?Patient Details  ?Name: [name] ?MRN: [id number] ?Date of Birth: [birth year] ? ?Transition of Care (TOC) CM/SW Contact:  ?Shade Flood, LCSW ?Phone Number: ?03/30/2022, 10:59 AM ? ? ?Clinical Narrative:    ? ?Pt will dc later today after HD per MD. Requested HH orders for PT/OT/RN. Updated Cory at [facility] and they can add RN to pt's current [facility] services. ? ?No other TOC needs identified for dc.  ? ?Final next level of care: [place] ?Barriers to Discharge: Barriers Resolved ? ? ?Patient Goals and CMS Choice ?Patient states their goals for this hospitalization and ongoing recovery are:: go home ?CMS Medicare.gov Compare Post Acute Care list provided to:: Patient ?Choice offered to / list presented to : Patient ? ?Discharge Placement ?  ?           ?  ?  ?  ?  ? ?Discharge Plan and Services ?In-house Referral: Clinical Social Work ?  ?Post Acute Care Choice: Resumption of Svcs/PTA Provider          ?  ?  ?  ?  ?  ?HH Arranged: RN, PT, OT ?[place] Agency: [facility] ?Date HH Agency Contacted: 03/30/22 ?  ?Representative spoke with at [facility]: Tommi Rumps ? ?Social Determinants of Health (SDOH) Interventions ?  ? ? ?Readmission Risk Interventions ? ?  03/27/2022  ?  4:22 PM 03/08/2022  ?  1:58 PM  ?Readmission Risk Prevention Plan  ?Transportation Screening Complete Complete  ?Medication Review Press photographer) Complete Complete  ?PCP or Specialist appointment within 3-5 days of discharge  Complete  ?[place] or Home Care Consult Complete Complete  ?SW Recovery Care/Counseling Consult Complete Complete  ?Palliative Care Screening Not Applicable Not Applicable  ?Skilled Nursing Facility Not Applicable Complete  ? ? ? ? ? ?

## 2022-03-30 NOTE — Progress Notes (Signed)
? ?Progress Note ? ?Patient Name: [name] ?Date of Encounter: 03/30/2022 ? ?Primary Cardiologist: Minus Breeding, MD ? ?Subjective  ? ?States that she tolerated hemodialysis yesterday, has another session pending today to get her back on her schedule.  No active bleeding from AV fistula at this time all dressed.  No sense of palpitations. ? ?Inpatient Medications  ?  ?Scheduled Meds: ? apixaban  5 mg Oral BID  ? atenolol  75 mg Oral QHS  ? calcium acetate  1,334 mg Oral TID WC  ? Chlorhexidine Gluconate Cloth  6 each Topical Daily  ? Chlorhexidine Gluconate Cloth  6 each Topical Q0600  ? darbepoetin (ARANESP) injection - DIALYSIS  60 mcg Intravenous Q Thu-HD  ? diltiazem  240 mg Oral Daily  ? famotidine  20 mg Oral QHS  ? gabapentin  800 mg Oral Q M,W,F-1800  ? guaiFENesin  10 mL Oral Q8H  ? insulin aspart  0-15 Units Subcutaneous TID WC  ? insulin aspart  0-5 Units Subcutaneous QHS  ? insulin aspart  3 Units Subcutaneous TID WC  ? insulin glargine-yfgn  32 Units Subcutaneous QHS  ? ipratropium-albuterol  3 mL Nebulization Q6H  ? mometasone-formoterol  2 puff Inhalation BID  ? QUEtiapine  200 mg Oral QHS  ? rOPINIRole  1 mg Oral QHS  ? rosuvastatin  5 mg Oral Daily  ? sodium chloride flush  3 mL Intravenous Q12H  ? ticagrelor  90 mg Oral BID  ? ?Continuous Infusions: ? sodium chloride    ? sodium chloride    ? ceFEPime (MAXIPIME) IV 2 g (03/29/22 1506)  ? ?PRN Meds: ?sodium chloride, sodium chloride, acetaminophen **OR** acetaminophen, albuterol, alteplase, bisacodyl, calcium acetate **AND** calcium acetate, dextromethorphan-guaiFENesin, heparin, heparin, hydrALAZINE, hydrOXYzine, lidocaine (PF), lidocaine-prilocaine, ondansetron **OR** ondansetron (ZOFRAN) IV, pentafluoroprop-tetrafluoroeth, polyethylene glycol, sodium chloride flush  ? ?Vital Signs  ?  ?Vitals:  ? 03/30/22 2706 03/30/22 2376 03/30/22 2831 03/30/22 0834  ?BP:  138/68    ?Pulse:  77    ?Resp:  18    ?Temp:  97.7 ?F (36.5 ?C)    ?TempSrc:       ?SpO2: 99% 100% 99% 100%  ?Weight:  81.6 kg    ?Height:      ? ? ?Intake/Output Summary (Last 24 hours) at 03/30/2022 0955 ?Last data filed at 03/30/2022 0535 ?Gross per 24 hour  ?Intake 660 ml  ?Output 2000 ml  ?Net -1340 ml  ? ?Filed Weights  ? 03/29/22 0505 03/29/22 1210 03/30/22 0556  ?Weight: 82.3 kg 82.3 kg 81.6 kg  ? ? ?Telemetry  ?  ?Atrial fibrillation, heart rate 100-110.  Personally reviewed. ? ?ECG  ?  ?No ECG reviewed. ? ?Physical Exam  ? ?GEN: No acute distress.   ?Neck: No JVD. ?Cardiac: I irregularly irregular, no gallop.  ?Respiratory: Nonlabored.  Expiratory wheezes. ?GI: Soft, nontender, bowel sounds present. ?MS: Compression stockings in place.  No deformity. ?Neuro:  Nonfocal. ?Psych: Alert and oriented x 3. Normal affect. ? ?Labs  ?  ?Chemistry ?Recent Labs  ?Lab 03/26/22 ?1407 03/27/22 ?5176 03/28/22 ?0900 03/29/22 ?0416 03/30/22 ?0350  ?NA 131*   < > 131* 131* 135  ?K 3.2*   < > 4.7 4.9 4.1  ?CL 90*   < > 92* 92* 94*  ?CO2 27   < > 25 24 31   ?GLUCOSE 168*   < > 350* 104* 83  ?BUN 22   < > 35* 54* 35*  ?CREATININE 6.72*   < >  5.96* 7.68* 5.61*  ?CALCIUM 8.6*   < > 9.3 9.1 8.8*  ?PROT 7.1  --   --   --   --   ?ALBUMIN 3.9  --  3.7 3.4*  --   ?AST 20  --   --   --   --   ?ALT 18  --   --   --   --   ?ALKPHOS 68  --   --   --   --   ?BILITOT 0.6  --   --   --   --   ?GFRNONAA 6*   < > 7* 5* 8*  ?ANIONGAP 14   < > 14 15 10   ? < > = values in this interval not displayed.  ?  ? ?Hematology ?Recent Labs  ?Lab 03/28/22 ?0900 03/29/22 ?0416 03/30/22 ?0350  ?WBC 20.9* 21.0* 15.3*  ?RBC 3.20* 3.09* 3.34*  ?HGB 9.2* 8.9* 9.2*  ?HCT 27.8* 27.3* 30.0*  ?MCV 86.9 88.3 89.8  ?MCH 28.8 28.8 27.5  ?MCHC 33.1 32.6 30.7  ?RDW 17.9* 18.0* 18.4*  ?PLT 276 274 296  ? ? ?Cardiac Enzymes ?Recent Labs  ?Lab 03/04/22 ?2246 03/05/22 ?0323 03/26/22 ?1407 03/26/22 ?1630 03/27/22 ?7846  ?TROPONINIHS 105* 181* 77* 84* 81*  ? ? ?BNP ?Recent Labs  ?Lab 03/26/22 ?1407  ?BNP 2,276.0*  ?  ? ?Radiology  ?  ?DG Chest 2  View ? ?Result Date: 03/29/2022 ?CLINICAL DATA:  Weakness and intermittent shortness of breath. History of CAD COPD, hypertension, CHF stroke and former smoker. EXAM: CHEST - 2 VIEW COMPARISON:  Radiograph March 26, 2022 FINDINGS: Similar cardiac enlargement and central vascular prominence. Aortic atherosclerosis. Chronic parenchymal lung changes. Left lower lobe airspace opacity with partial obscuration of the hemidiaphragm. No visible pleural effusion or pneumothorax. Partially visualized anterior spinal fusion hardware. IMPRESSION: 1. Left lower lobe airspace opacity which may reflect atelectasis or pneumonia. 2. Similar cardiac enlargement and central vascular prominence. Electronically Signed   By: Dahlia Bailiff M.D.   On: 03/29/2022 08:11  ? ?NM Pulmonary Perfusion ? ?Result Date: 03/28/2022 ?CLINICAL DATA:  Concern for pulmonary embolus EXAM: NUCLEAR MEDICINE PERFUSION LUNG SCAN TECHNIQUE: Perfusion images were obtained in multiple projections after intravenous injection of radiopharmaceutical. Ventilation scans intentionally deferred if perfusion scan and chest x-ray adequate for interpretation during COVID 19 epidemic. RADIOPHARMACEUTICALS:  4.4 mCi Tc-20m MAA IV COMPARISON:  Chest x-ray dated March 26, 2022 FINDINGS: No segmental perfusion defects. IMPRESSION: Pulmonary embolus absent. Electronically Signed   By: Yetta Glassman M.D.   On: 03/28/2022 14:19   ? ?Cardiac Studies  ? ?Echocardiogram 03/05/2022: ? 1. Left ventricular ejection fraction, by estimation, is 55%. The left  ?ventricle has low normal function. The left ventricle has no regional wall  ?motion abnormalities. There is mild left ventricular hypertrophy. Left  ?ventricular diastolic parameters  ?are indeterminate.  ? 2. Compared to prior study, RV dysfunction is new  ?    [name] Right ventricular systolic function moderate to severely decreased.  ?The right ventricular size is mildly enlarged. There is severely elevated  ?pulmonary artery  systolic pressure.  ? 3. Left atrial size was mildly dilated.  ? 4. Right atrial size was mildly dilated.  ? 5. A small pericardial effusion is present.  ? 6. Mild mitral valve regurgitation.  ? 7. The aortic valve is tricuspid. Aortic valve regurgitation is not  ?visualized. Aortic valve sclerosis is present, with no evidence of aortic  ?valve stenosis.  ? 8. The inferior vena  cava is dilated in size with <50% respiratory  ?variability, suggesting right atrial pressure of 15 mmHg.  ? ?Coronary PCI 03/08/2022: ?1.  Successful atherectomy, PTCA, and stenting of severe stenosis in the mid LAD, reducing 90% calcific stenosis to 0%, TIMI-3 flow pre and post, with a 3.0 x 18 mm Onyx frontier DES ?2.  Successful atherectomy, PTCA, and stenting of severe stenosis in the mid RCA, reducing 95% calcific stenosis to 0%, TIMI-3 flow pre and post, with a 3.0 x 34 mm Onyx frontier DES ?  ?Recommendations: Patient with atrial fibrillation, significant comorbidities of end-stage renal disease.  Considering balance of antithrombotic efficacy and concern over bleeding risk with multidrug therapy, favor the following approach: ?Resume apixaban tomorrow at normal dosing schedule, no heparin overnight ?Ticagrelor 90 mg twice daily x3 months ?Aspirin 81 mg daily long-term after 3 months of ticagrelor completed ? ?Assessment & Plan  ?  ?1.  Persistent atrial fibrillation, CHA2DS2-VASc score is 6.  Focus is on heart rate control at this time along with anticoagulation.  Blood pressure has been stable on combination of atenolol and Cardizem CD.  She is on Eliquis for stroke prophylaxis. ? ?2.  CAD status post NSTEMI in early April and status post atherectomy/PTCA with DES intervention to the mid LAD has atherectomy/PTCA and DES intervention to the mid RCA.  She is on Brilinta given recent PCI with plan to transition to aspirin after 75-month since she is also on Eliquis. ? ?3.  ESRD on hemodialysis. ? ?4.  HFpEF, LVEF 55%.  Fluid status  managed via hemodialysis. ? ?5.  Severe pulmonary hypertension with RV dysfunction, estimated RVSP 62 mmHg.  Most likely combination of WHO group 2 and 3.  VQ scan negative for pulmonary embolus. ? ?She is to undergo further

## 2022-03-30 NOTE — Care Management Important Message (Signed)
Important Message ? ?Patient Details  ?Name: [name] ?MRN: [id number] ?Date of Birth: November 27, 1951 ? ? ?Medicare Important Message Given:  Yes ? ? ? ? ?[name] ?03/30/2022, 11:46 AM ?

## 2022-03-31 DIAGNOSIS — I4819 Other persistent atrial fibrillation: Secondary | ICD-10-CM | POA: Diagnosis not present

## 2022-03-31 DIAGNOSIS — I25119 Atherosclerotic heart disease of native coronary artery with unspecified angina pectoris: Secondary | ICD-10-CM | POA: Diagnosis not present

## 2022-03-31 DIAGNOSIS — I4891 Unspecified atrial fibrillation: Secondary | ICD-10-CM | POA: Diagnosis not present

## 2022-03-31 LAB — BASIC METABOLIC PANEL
Anion gap: 9 (ref 5–15)
BUN: 25 mg/dL — ABNORMAL HIGH (ref 8–23)
CO2: 30 mmol/L (ref 22–32)
Calcium: 9 mg/dL (ref 8.9–10.3)
Chloride: 95 mmol/L — ABNORMAL LOW (ref 98–111)
Creatinine, Ser: 4.79 mg/dL — ABNORMAL HIGH (ref 0.44–1.00)
GFR, Estimated: 9 mL/min — ABNORMAL LOW (ref >60)
Glucose, Bld: 71 mg/dL (ref 70–99)
Potassium: 3.9 mmol/L (ref 3.5–5.1)
Sodium: 134 mmol/L — ABNORMAL LOW (ref 135–145)

## 2022-03-31 LAB — IRON AND TIBC
Iron: 46 ug/dL (ref 28–170)
Saturation Ratios: 17 % (ref 10.4–31.8)
TIBC: 266 ug/dL (ref 250–450)
UIBC: 220 ug/dL

## 2022-03-31 LAB — CBC
HCT: 32.1 % — ABNORMAL LOW (ref 36.0–46.0)
Hemoglobin: 10.1 g/dL — ABNORMAL LOW (ref 12.0–15.0)
MCH: 29 pg (ref 26.0–34.0)
MCHC: 31.5 g/dL (ref 30.0–36.0)
MCV: 92.2 fL (ref 80.0–100.0)
Platelets: 301 10*3/uL (ref 150–400)
RBC: 3.48 MIL/uL — ABNORMAL LOW (ref 3.87–5.11)
RDW: 19.5 % — ABNORMAL HIGH (ref 11.5–15.5)
WBC: 13.4 10*3/uL — ABNORMAL HIGH (ref 4.0–10.5)
nRBC: 3.1 % — ABNORMAL HIGH (ref 0.0–0.2)

## 2022-03-31 LAB — GLUCOSE, CAPILLARY
Glucose-Capillary: 78 mg/dL (ref 70–99)
Glucose-Capillary: 213 mg/dL — ABNORMAL HIGH (ref 70–99)

## 2022-03-31 LAB — CULTURE, BLOOD (ROUTINE X 2)
Culture: NO GROWTH
Culture: NO GROWTH
Special Requests: ADEQUATE
Special Requests: ADEQUATE

## 2022-03-31 LAB — PROCALCITONIN: Procalcitonin: 1.84 ng/mL

## 2022-03-31 LAB — FERRITIN: Ferritin: 613 ng/mL — ABNORMAL HIGH (ref 11–307)

## 2022-03-31 MED ORDER — DILTIAZEM HCL ER COATED BEADS 240 MG PO CP24: 240.0000 mg | ORAL_CAPSULE | Freq: Every day | ORAL | 0 refills | Status: DC

## 2022-03-31 MED ORDER — ATENOLOL 100 MG PO TABS
100.0000 mg | ORAL_TABLET | Freq: Every day | ORAL | 0 refills | Status: DC
Start: 2022-03-31 — End: 2022-03-31

## 2022-03-31 MED ORDER — ATENOLOL 100 MG PO TABS
100.0000 mg | ORAL_TABLET | Freq: Every day | ORAL | 0 refills | Status: DC
Start: 2022-03-31 — End: 2022-05-10

## 2022-03-31 NOTE — Progress Notes (Signed)
? ?Progress Note ? ?Patient Name: [name] ?Date of Encounter: 03/31/2022 ? ?Primary Cardiologist: Minus Breeding, MD ? ?Subjective  ? ?Tolerated hemodialysis yesterday.  No substantial bleeding from AV fistula site.  She is at bedside chair this morning, no palpitations or chest pain. ? ?Inpatient Medications  ?  ?Scheduled Meds: ? apixaban  5 mg Oral BID  ? atenolol  100 mg Oral QHS  ? calcium acetate  1,334 mg Oral TID WC  ? Chlorhexidine Gluconate Cloth  6 each Topical Daily  ? Chlorhexidine Gluconate Cloth  6 each Topical Q0600  ? darbepoetin (ARANESP) injection - DIALYSIS  60 mcg Intravenous Q Thu-HD  ? diltiazem  240 mg Oral Daily  ? famotidine  20 mg Oral QHS  ? gabapentin  800 mg Oral Q M,W,F-1800  ? guaiFENesin  10 mL Oral Q8H  ? insulin aspart  0-15 Units Subcutaneous TID WC  ? insulin aspart  0-5 Units Subcutaneous QHS  ? insulin aspart  3 Units Subcutaneous TID WC  ? insulin glargine-yfgn  32 Units Subcutaneous QHS  ? ipratropium-albuterol  3 mL Nebulization Q6H WA  ? mometasone-formoterol  2 puff Inhalation BID  ? QUEtiapine  200 mg Oral QHS  ? rOPINIRole  1 mg Oral QHS  ? rosuvastatin  5 mg Oral Daily  ? sodium chloride flush  3 mL Intravenous Q12H  ? ticagrelor  90 mg Oral BID  ? ?Continuous Infusions: ? sodium chloride    ? sodium chloride    ? ceFEPime (MAXIPIME) IV 2 g (03/30/22 1531)  ? ?PRN Meds: ?sodium chloride, sodium chloride, acetaminophen **OR** acetaminophen, albuterol, alteplase, bisacodyl, calcium acetate **AND** calcium acetate, dextromethorphan-guaiFENesin, heparin, heparin, hydrALAZINE, hydrOXYzine, lidocaine (PF), lidocaine-prilocaine, ondansetron **OR** ondansetron (ZOFRAN) IV, pentafluoroprop-tetrafluoroeth, polyethylene glycol, sodium chloride flush  ? ?Vital Signs  ?  ?Vitals:  ? 03/30/22 2129 03/31/22 0537 03/31/22 0753 03/31/22 0800  ?BP: (!) 143/71 119/67    ?Pulse: (!) 105 88    ?Resp: 17 17    ?Temp: 98.7 ?F (37.1 ?C) 97.9 ?F (36.6 ?C)    ?TempSrc: Oral     ?SpO2:  99% 100% 96% 100%  ?Weight:  81.4 kg    ?Height:      ? ? ?Intake/Output Summary (Last 24 hours) at 03/31/2022 0931 ?Last data filed at 03/30/2022 1900 ?Gross per 24 hour  ?Intake 420 ml  ?Output 2000 ml  ?Net -1580 ml  ? ?Filed Weights  ? 03/30/22 1239 03/30/22 1627 03/31/22 0537  ?Weight: 81.6 kg 79 kg 81.4 kg  ? ? ?Telemetry  ?  ?Atrial fibrillation with heart rate in the 90s at rest.  Personally reviewed. ? ?ECG  ?  ?No ECG reviewed. ? ?Physical Exam  ? ?GEN: No acute distress.   ?Neck: No JVD. ?Cardiac: Irregularly irregular without gallop.  ?Respiratory: Nonlabored.  Expiratory wheezing. ?GI: Soft, nontender, bowel sounds present. ?MS: Compression stockings in place. ?Neuro:  Nonfocal. ?Psych: Alert and oriented x 3. Normal affect. ? ?Labs  ?  ?Chemistry ?Recent Labs  ?Lab 03/26/22 ?1407 03/27/22 ?6295 03/28/22 ?0900 03/29/22 ?0416 03/30/22 ?0350 03/31/22 ?2841  ?NA 131*   < > 131* 131* 135 134*  ?K 3.2*   < > 4.7 4.9 4.1 3.9  ?CL 90*   < > 92* 92* 94* 95*  ?CO2 27   < > 25 24 31 30   ?GLUCOSE 168*   < > 350* 104* 83 71  ?BUN 22   < > 35* 54* 35* 25*  ?CREATININE 6.72*   < >  5.96* 7.68* 5.61* 4.79*  ?CALCIUM 8.6*   < > 9.3 9.1 8.8* 9.0  ?PROT 7.1  --   --   --   --   --   ?ALBUMIN 3.9  --  3.7 3.4*  --   --   ?AST 20  --   --   --   --   --   ?ALT 18  --   --   --   --   --   ?ALKPHOS 68  --   --   --   --   --   ?BILITOT 0.6  --   --   --   --   --   ?GFRNONAA 6*   < > 7* 5* 8* 9*  ?ANIONGAP 14   < > 14 15 10 9   ? < > = values in this interval not displayed.  ?  ? ?Hematology ?Recent Labs  ?Lab 03/29/22 ?0416 03/30/22 ?0350 03/31/22 ?2458  ?WBC 21.0* 15.3* 13.4*  ?RBC 3.09* 3.34* 3.48*  ?HGB 8.9* 9.2* 10.1*  ?HCT 27.3* 30.0* 32.1*  ?MCV 88.3 89.8 92.2  ?MCH 28.8 27.5 29.0  ?MCHC 32.6 30.7 31.5  ?RDW 18.0* 18.4* 19.5*  ?PLT 274 296 301  ? ? ?Cardiac Enzymes ?Recent Labs  ?Lab 03/04/22 ?2246 03/05/22 ?0323 03/26/22 ?1407 03/26/22 ?1630 03/27/22 ?0998  ?TROPONINIHS 105* 181* 77* 84* 81*  ? ? ?BNP ?Recent Labs  ?Lab  03/26/22 ?1407  ?BNP 2,276.0*  ?  ? ?Radiology  ?  ?No results found. ? ?Cardiac Studies  ? ?Echocardiogram 03/05/2022: ? 1. Left ventricular ejection fraction, by estimation, is 55%. The left  ?ventricle has low normal function. The left ventricle has no regional wall  ?motion abnormalities. There is mild left ventricular hypertrophy. Left  ?ventricular diastolic parameters  ?are indeterminate.  ? 2. Compared to prior study, RV dysfunction is new  ?    [name] Right ventricular systolic function moderate to severely decreased.  ?The right ventricular size is mildly enlarged. There is severely elevated  ?pulmonary artery systolic pressure.  ? 3. Left atrial size was mildly dilated.  ? 4. Right atrial size was mildly dilated.  ? 5. A small pericardial effusion is present.  ? 6. Mild mitral valve regurgitation.  ? 7. The aortic valve is tricuspid. Aortic valve regurgitation is not  ?visualized. Aortic valve sclerosis is present, with no evidence of aortic  ?valve stenosis.  ? 8. The inferior vena cava is dilated in size with <50% respiratory  ?variability, suggesting right atrial pressure of 15 mmHg.  ? ?Coronary PCI 03/08/2022: ?1.  Successful atherectomy, PTCA, and stenting of severe stenosis in the mid LAD, reducing 90% calcific stenosis to 0%, TIMI-3 flow pre and post, with a 3.0 x 18 mm Onyx frontier DES ?2.  Successful atherectomy, PTCA, and stenting of severe stenosis in the mid RCA, reducing 95% calcific stenosis to 0%, TIMI-3 flow pre and post, with a 3.0 x 34 mm Onyx frontier DES ?  ?Recommendations: Patient with atrial fibrillation, significant comorbidities of end-stage renal disease.  Considering balance of antithrombotic efficacy and concern over bleeding risk with multidrug therapy, favor the following approach: ?Resume apixaban tomorrow at normal dosing schedule, no heparin overnight ?Ticagrelor 90 mg twice daily x3 months ?Aspirin 81 mg daily long-term after 3 months of ticagrelor completed ? ?Assessment & Plan   ?  ?1.  Persistent atrial fibrillation, CHA2DS2-VASc score is 6.  Focus is on heart rate control at this time along with  anticoagulation.  She is on Eliquis for stroke prophylaxis.  Heart rate control regimen includes atenolol 100 mg in the evening and Cardizem CD 240 mg in the morning. ? ?2.  CAD status post NSTEMI in early April and status post atherectomy/PTCA with DES intervention to the mid LAD has atherectomy/PTCA and DES intervention to the mid RCA.  She is on Brilinta given recent PCI with plan to transition to aspirin after 60-month since she is also on Eliquis. ? ?3.  ESRD on hemodialysis. ? ?4.  HFpEF, LVEF 55%.  Fluid status managed via hemodialysis. ? ?5.  Severe pulmonary hypertension with RV dysfunction, estimated RVSP 62 mmHg.  Most likely combination of WHO group 2 and 3.  VQ scan negative for pulmonary embolus. ? ?6.  COPD, has been treated for exacerbation with Solu-Medrol and mucolytics. ? ?Need to increase activity to determine stability for discharge planning.  She is generally very hesitant to go home on discussion today.  Continue Eliquis, atenolol, Cardizem CD, Brilinta, and Crestor.  Heart rate control does look reasonable at this point, although AV nodal blockers can be titrated as needed depending on how she does with increasing activity.  Please schedule follow-up with Dr. Percival Spanish or APP for outpatient cardiology follow-up, ideally within 2 weeks. ? ?Signed, ?Rozann Lesches, MD  ?03/31/2022, 9:31 AM    ?

## 2022-03-31 NOTE — Progress Notes (Signed)
PT Cancellation Note ? ?Patient Details ?Name: [name] ?MRN: [id number] ?DOB: [birth year] ? ? ?Cancelled Treatment:    Reason Eval/Treat Not Completed: PT screened, no needs identified, will sign off.  Patient ambulating in room independently and with nursing staff in hallways supervised without loss of balance. ? ? ?11:06 AM, 03/31/22 ?Lonell Grandchild, MPT ?Physical Therapist with [place] ?[facility] ?[phone] office ?[phone] mobile phone ? ?

## 2022-03-31 NOTE — Care Management Important Message (Signed)
Important Message ? ?Patient Details  ?Name: [name] ?MRN: [id number] ?Date of Birth: [birth year] ? ? ?Medicare Important Message Given:  Yes ? ? ? ? ?[name] ?03/31/2022, 1:21 PM ?

## 2022-03-31 NOTE — Progress Notes (Addendum)
Patient ID: [name], female   DOB: 01/16/1951, 71 y.o.   MRN: [id number] ?S: no acute events overnight, remains in afib. Tolerated hd yesterday w/ net uf 2L. Removed pressure dressings 4 hrs thereafter and she reports that there was slightly bleeding which was very brief but did not bleed thereafter. Her biggest complaint today is congestion in the chest, feels like "glue". Unable to cough it out. ? ?O:BP 119/67 (BP Location: Left Arm)   Pulse 88   Temp 97.9 ?F (36.6 ?C)   Resp 17   Ht 5\' 3"  (1.6 m)   Wt 81.4 kg Comment: standing scale used, witnessed by victoria anderson NT  SpO2 100%   BMI 31.79 kg/m?  ? ?Intake/Output Summary (Last 24 hours) at 03/31/2022 0823 ?Last data filed at 03/30/2022 1900 ?Gross per 24 hour  ?Intake 660 ml  ?Output 2000 ml  ?Net -1340 ml  ? ?Intake/Output: ?I/O last 3 completed shifts: ?In: 900 [P.O.:900] ?Out: 2000 [Other:2000] ? Intake/Output this shift: ? No intake/output data recorded. ?Weight change: -0.7 kg ?Gen:NAD, sitting up in chair ?CVS: IRR IRR ?Resp: cta bl, +end exp wheezing b/l, no rales/crackles, unlabored, bl chest expansion ?Abd: +BS, soft, NT/ND ?Ext: no edema LUE AVF +T/B: no bleeding ? ?Recent Labs  ?Lab 03/26/22 ?1407 03/27/22 ?5462 03/28/22 ?0900 03/29/22 ?0416 03/30/22 ?0350 03/31/22 ?7035  ?NA 131* 130* 131* 131* 135 134*  ?K 3.2* 4.3 4.7 4.9 4.1 3.9  ?CL 90* 92* 92* 92* 94* 95*  ?CO2 27 21* 25 24 31 30   ?GLUCOSE 168* 210* 350* 104* 83 71  ?BUN 22 33* 35* 54* 35* 25*  ?CREATININE 6.72* 8.02* 5.96* 7.68* 5.61* 4.79*  ?ALBUMIN 3.9  --  3.7 3.4*  --   --   ?CALCIUM 8.6* 8.4* 9.3 9.1 8.8* 9.0  ?PHOS  --   --  5.0* 5.5*  --   --   ?AST 20  --   --   --   --   --   ?ALT 18  --   --   --   --   --   ? ?Liver Function Tests: ?Recent Labs  ?Lab 03/26/22 ?1407 03/28/22 ?0900 03/29/22 ?0416  ?AST 20  --   --   ?ALT 18  --   --   ?ALKPHOS 68  --   --   ?BILITOT 0.6  --   --   ?PROT 7.1  --   --   ?ALBUMIN 3.9 3.7 3.4*  ? ?No results for input(s): LIPASE, AMYLASE in  the last 168 hours. ?No results for input(s): AMMONIA in the last 168 hours. ?CBC: ?Recent Labs  ?Lab 03/27/22 ?0093 03/28/22 ?0900 03/29/22 ?0416 03/30/22 ?0350 03/31/22 ?8182  ?WBC 14.0* 20.9* 21.0* 15.3* 13.4*  ?HGB 8.9* 9.2* 8.9* 9.2* 10.1*  ?HCT 28.5* 27.8* 27.3* 30.0* 32.1*  ?MCV 89.6 86.9 88.3 89.8 92.2  ?PLT 223 276 274 296 301  ? ?Cardiac Enzymes: ?No results for input(s): CKTOTAL, CKMB, CKMBINDEX, TROPONINI in the last 168 hours. ?CBG: ?Recent Labs  ?Lab 03/30/22 ?0727 03/30/22 ?1126 03/30/22 ?1711 03/30/22 ?2126 03/31/22 ?9937  ?GLUCAP 65* 218* 84 156* 78  ? ? ?Iron Studies:  ?Recent Labs  ?  03/31/22 ?1696  ?IRON 46  ?TIBC 266  ?FERRITIN 613*  ? ?Studies/Results: ?No results found. ? ? apixaban  5 mg Oral BID  ? atenolol  100 mg Oral QHS  ? calcium acetate  1,334 mg Oral TID WC  ? Chlorhexidine Gluconate Cloth  6  each Topical Daily  ? Chlorhexidine Gluconate Cloth  6 each Topical Q0600  ? darbepoetin (ARANESP) injection - DIALYSIS  60 mcg Intravenous Q Thu-HD  ? diltiazem  240 mg Oral Daily  ? famotidine  20 mg Oral QHS  ? gabapentin  800 mg Oral Q M,W,F-1800  ? guaiFENesin  10 mL Oral Q8H  ? insulin aspart  0-15 Units Subcutaneous TID WC  ? insulin aspart  0-5 Units Subcutaneous QHS  ? insulin aspart  3 Units Subcutaneous TID WC  ? insulin glargine-yfgn  32 Units Subcutaneous QHS  ? ipratropium-albuterol  3 mL Nebulization Q6H WA  ? mometasone-formoterol  2 puff Inhalation BID  ? QUEtiapine  200 mg Oral QHS  ? rOPINIRole  1 mg Oral QHS  ? rosuvastatin  5 mg Oral Daily  ? sodium chloride flush  3 mL Intravenous Q12H  ? ticagrelor  90 mg Oral BID  ? ? ?BMET ?   ?Component Value Date/Time  ? NA 134 (L) 03/31/2022 0417  ? NA 137 03/17/2022 1136  ? K 3.9 03/31/2022 0417  ? CL 95 (L) 03/31/2022 0417  ? CO2 30 03/31/2022 0417  ? GLUCOSE 71 03/31/2022 0417  ? BUN 25 (H) 03/31/2022 0417  ? BUN 21 03/17/2022 1136  ? CREATININE 4.79 (H) 03/31/2022 0417  ? CREATININE 1.66 (H) 03/25/2013 1026  ? CALCIUM 9.0  03/31/2022 0417  ? GFRNONAA 9 (L) 03/31/2022 0417  ? GFRAA 4 (L) 11/01/2020 1022  ? ?CBC ?   ?Component Value Date/Time  ? WBC 13.4 (H) 03/31/2022 0417  ? RBC 3.48 (L) 03/31/2022 0417  ? HGB 10.1 (L) 03/31/2022 0417  ? HGB 8.9 (LL) 03/17/2022 1136  ? HCT 32.1 (L) 03/31/2022 0417  ? HCT 27.8 (L) 03/17/2022 1136  ? PLT 301 03/31/2022 0417  ? PLT 232 03/17/2022 1136  ? MCV 92.2 03/31/2022 0417  ? MCV 88 03/17/2022 1136  ? MCH 29.0 03/31/2022 0417  ? MCHC 31.5 03/31/2022 0417  ? RDW 19.5 (H) 03/31/2022 0417  ? RDW 16.1 (H) 03/17/2022 1136  ? LYMPHSABS 1.8 03/17/2022 1136  ? MONOABS 0.6 03/05/2022 0740  ? EOSABS 0.5 (H) 03/17/2022 1136  ? BASOSABS 0.1 03/17/2022 1136  ? ?Dialysis Orders: Center: [facility]  on TTS . ?EDW 82 kg HD Bath 1K/2.5Ca  Time 4 hours Heparin 1000 units bolus then 1000 unts/hr. Access RUE AVF BFR 350 DFR 500    ?Calcitriol 2.25 mcg po/HD Micera 50 mcg IVP every 2 weeks  ?  ?  ?Assessment/Plan: ? Atrial fibrillation with RVR -  On cardizem PO, Eliquis, and atenolol.  cardiology on board, rate controlled ? Acute on chronic diastolic CHF - elevated BNP and likely due to a fib with RVR.   ECHO also noted moderate RV dysfunction and pulmonary HTN. . UF as tolerated ? ESRD -  Normally runs TTS. Back on schedule today ? Bleeding from RUE AVF - has been an issue since starting Brilinta following cardiac cath on 03/07/22.  S/p fistulogram on 03/14/22 with 80% stenosis of cephalic vein s/p PTA with 30% residual stenosis.  Will need to be on brilinta given recent PCI, could consider holding eliquis as per cardiology (however would be at increased risk for thromboembolic events from afib). VVS evaluated, if continues to be a problem, then will need to consider placing TDC instead. Fortunately, this does not seem the case as of the last couple of days ? Hypertension/volume  - UF as tolerated ? Anemia  - continue  with ESA (dose 03/30/22) and follow H/H. ? Metabolic bone disease -  continue with home meds ?  Nutrition - renal diet, carb modified ? CAD s/p recent NSTEMI and PTCA and DES to RCA and LAD - on 03/06/22.  Cardiology following. ? COPD - likely contributing to her current SOB. Mgmt per primary service. ? ?Will be monitoring patient remotely over the weekend, will be seen again on Monday. If any issues/questions/concerns please call covering nephrologist. ? ?Gean Quint, MD ?[facility] ? ?

## 2022-03-31 NOTE — Discharge Summary (Signed)
Physician Tamara Summary  ?[name] DOB: 08-16-1951 [name] ? ?PCP: [name], MD ? ?Admit date: Baker ?[name] ? ?Time spent: 50 minutes ? ?Recommendations for Outpatient Follow-up:  ?Continue routine dialysis schedule outpatient ?Follow-up with primary care physician in 1 to 2 weeks ?Follow-up with nephrologist routinely ? ? ?Tamara Diagnoses:  ?Principal Problem: ?  Atrial fibrillation with RVR ([place]) ?Active Problems: ?  Acute exacerbation of Diastolic CHF (congestive heart failure) ([place]) ?  End stage renal disease on dialysis due to type 2 diabetes mellitus ([place]) ?  Pulmonary hypertension ([place]) ?  Type II diabetes mellitus with nephropathy ([place]) ?  Hypertension associated with diabetes ([place]) ?  COPD (chronic obstructive pulmonary disease) ([place]) ?  Anemia of chronic disease/ESRD ?  Elevated brain natriuretic peptide (BNP) level ? ? ?Tamara Condition: Good ? ? ?Filed Weights  ? 03/30/22 1239 03/30/22 1627 03/31/22 0537  ?Weight: 81.6 kg 79 kg 81.4 kg  ? ? ?History of present illness:  ?71 y.o. female with past medical history relevant for obesity, HTN, DM2, ESRD on hemodialysis on TTS schedule, COPD, recent diagnosis of paroxysmal A-fib in April 2023, CAD with recent angioplasty and stent placement on 03/08/2022, GERD, and chronic anemia of ESRD admitted on Baker with acute on chronic diastolic CHF exacerbation.  Patient admitted with a bleeding AV fistula recently had cardiac stenting and on Brilinta and Eliquis.  Pressure dressing was placed on AV fistula this eventually stopped bleeding.  Her hemoglobin remained stable.  Both nephrology and cardiology was consulted.  Status post arthrectomy/PTCA with DES intervention to the mid LAD in April 2023.  Patient high risk for occlusion of her stent and recommended to continue Eliquis and Brilinta in the setting of the AV fistula bleeding and monitor closely.  Patient had routine dialysis while she was  here.  By the time of Tamara she was improved and at her baseline status.  Physical therapy was consulted did not recommend any further therapy.  Patient is being discharged in stable and improved condition with home health and physical therapy.  She is to follow-up and continue routine dialysis as an outpatient.  She is instructed if any bleeding recurs to return to the emergency department. ? ?Hospital Course:  ?1)PAFib with RVR- ?-New diagnosis, first diagnosed earlier this month ?--Treated with IV Cardizem, now on p.o. Cardizem CD 240 mg daily ?-Continue PTA Eliquis  ?-Cardiology recommends increasing Atenolol to 100 mg twice daily ?-Echo from 03/05/2022 with EF of 55% with severe pulmonary hypertension and mildly dilated right and left atrium ?-Right controlled at Tamara ?  ?2)HFpEF- ?-- acute on chronic diastolic dysfunction exacerbation in the setting of #1 above ?-Chest x-ray consistent with pulmonary congestion ?-BNP 2276 up from 1600 recently ?-Despite IV Lasix no urine output, continue dialysis ?-Cardiorespiratory status improved after extra /off schedule hemodialysis on 03/27/2022 ?-Planning for hemodialysis today 03/30/2022 and then resume routine schedule Tuesday Thursday Saturday ?  ?3)CAD/Recent NSTEMI- ?-- troponin 77, down from 181 about 3 weeks ago ?-LHC on 03/06/2022 consistent with severe two-vessel disease involving RCA and LAD ?-Status post coronary atherectomy/angioplasty PCI on 03/08/2022 ?-Deemed to be too high risk a candidate for CABG ?-Continue Brilinta, Crestor and Atenolol ?-Per cardiology's plan is to continue Eliquis and Brilinta for 90 days from 03/08/2022 then switch Brilinta to aspirin 81 mg daily ?  ?4)Leukocytosis---  ?-- patient received vancomycin and cefepime in the ED ?-Chest x-ray suggestive of inflammatory changes possible infection-pneumonia ?-Cultures obtained ?-Empiric IV antibiotic cefepime >>  4/26 initiated with plan for quick taper ?-WBC trending back up suspected also  contributed by steroids ?-Stop antibiotics at Tamara, blood cultures all negative ?  ?5)Depression/anxiety--continue Seroquel 200 mg nightly along with hydroxyzine as needed anxiety ?  ?6)ESRD--TTS, last HD 03/25/2022 completed 4 hours of HD ?-Metabolic Bone Disease: Continue PhosLo for hyperphosphatemia. ?-Nephrology consult appreciated ?Received extra HD session on 03/27/2022 off schedule ?-Right arm AV fistula has been bleeding since 03/27/22, has stopped over 24 hours ago ?-  She did undergo fistulogram in [place] with Dr. Virl Cagey on 03/14/2022 for bleeding fistula ?-See full consult from vascular surgeon Dr. Sherren Mocha Early dated 03/28/2022--regarding bleeding from right AV fistula ?-Planned  hemodialysis 03/29/2022 and 03/30/2022 ?  ?  ?7)DM2-A1c 8.0  ?-resume home regimen ? ?8)HTN-----increase atenolol to 75 mg nightly, and p.o. Cardizem CD 240 mg daily- ?  ?9)COPD with pulmonary hypertension--patient with significant dyspnea, cough and slight wheezing ?-Completed  Solu-Medrol , c/n   mucolytics ?-Tapered off supplemental oxygen ?-Be judicious with bronchodilators in the setting of A-fib with RVR ?  ?10) chronic anemia of ESRD---- ?Hgb stable around 9  ?monitor closely especially while on Brilinta and Eliquis ?-Defer decision on ESA/Procrit to nephrology team ?  ?11) hypokalemia/hyponatremia --- magnesium WNL,  ?--Potassium normalized after replacement ?  ?12) chronic anticoagulation--- given angioplasty and stenting on 03/08/2022 in the setting of A-fib patient was advised to take Eliquis and Brilinta for 3 months through July 2023 and then transition most likely to aspirin ?  ?13)Pulm hypertension--- patient wondering if she qualifies for home oxygen ?Does not qualify ? ?Tamara Exam: ?Vitals:  ? 03/31/22 0800 03/31/22 1213  ?BP:  (!) 156/65  ?Pulse:  94  ?Resp:  16  ?Temp:  98.3 ?F (36.8 ?C)  ?SpO2: 100% 98%  ? ? ?General: Alert and oriented no apparent distress ?Cardiovascular: Irregular rate and regular rhythm  without murmurs rubs or gallops ?Respiratory: Clear to auscultation bilaterally no wheezes rhonchi rales ? ?Tamara Instructions ? ? ?Tamara Instructions   ? ? Diet - low sodium heart healthy   Complete by: As directed ?  ? Tamara instructions   Complete by: As directed ?  ? Continue routine dialysis schedule ? ?Follow-up with your nephrologist in 2 weeks ? ?Follow-up with your primary care physician in 1 to 2 weeks ? ?Follow-up with cardiologist in 2 weeks  ? Increase activity slowly   Complete by: As directed ?  ? No wound care   Complete by: As directed ?  ? ?  ? ?Allergies as of Baker   ? ?   Reactions  ? Azithromycin   ? Prolonged QT  ? Ace Inhibitors Cough  ? Clopidogrel Bisulfate Other (See Comments)  ? Sick, Headache, "Felt terrible"  ? Codeine Other (See Comments)  ? hallucinations  ? Lisinopril Cough  ? Penicillins Other (See Comments)  ? Unknown reaction ?Did it involve swelling of the face/tongue/throat, SOB, or low BP? Unknown ?Did it involve sudden or severe rash/hives, skin peeling, or any reaction on the inside of your mouth or nose? Unknown ?Did you need to seek medical attention at a hospital or doctor's office? Unknown ?When did it last happen?      childhood allergy ?If all above answers are "NO", may proceed with cephalosporin use.  ? ?  ? ?  ?Medication List  ?  ? ?TAKE these medications   ? ?acetaminophen 500 MG tablet ?Commonly known as: TYLENOL ?Take 1,000 mg by mouth every 6 (six) hours as  needed for moderate pain or headache. ?  ?albuterol 108 (90 Base) MCG/ACT inhaler ?Commonly known as: VENTOLIN HFA ?Inhale 2 puffs into the lungs every 6 (six) hours as needed for wheezing or shortness of breath. ?  ?albuterol (2.5 MG/3ML) 0.083% nebulizer solution ?Commonly known as: PROVENTIL ?Take 3 mLs (2.5 mg total) by nebulization every 6 (six) hours as needed for wheezing or shortness of breath. ?  ?apixaban 5 MG Tabs tablet ?Commonly known as: ELIQUIS ?Take 1 tablet (5 mg total) by  mouth 2 (two) times daily. ?  ?atenolol 100 MG tablet ?Commonly known as: TENORMIN ?Take 1 tablet (100 mg total) by mouth at bedtime. ?What changed:  ?medication strength ?how much to take ?when to take [facility]

## 2022-04-01 DIAGNOSIS — Z23 Encounter for immunization: Secondary | ICD-10-CM | POA: Diagnosis not present

## 2022-04-01 DIAGNOSIS — D509 Iron deficiency anemia, unspecified: Secondary | ICD-10-CM | POA: Diagnosis not present

## 2022-04-01 DIAGNOSIS — N186 End stage renal disease: Secondary | ICD-10-CM | POA: Diagnosis not present

## 2022-04-01 DIAGNOSIS — N250 Renal osteodystrophy: Secondary | ICD-10-CM | POA: Diagnosis not present

## 2022-04-01 DIAGNOSIS — Z992 Dependence on renal dialysis: Secondary | ICD-10-CM | POA: Diagnosis not present

## 2022-04-01 DIAGNOSIS — N2581 Secondary hyperparathyroidism of renal origin: Secondary | ICD-10-CM | POA: Diagnosis not present

## 2022-04-02 DIAGNOSIS — Z992 Dependence on renal dialysis: Secondary | ICD-10-CM | POA: Diagnosis not present

## 2022-04-02 DIAGNOSIS — N186 End stage renal disease: Secondary | ICD-10-CM | POA: Diagnosis not present

## 2022-04-04 DIAGNOSIS — N2581 Secondary hyperparathyroidism of renal origin: Secondary | ICD-10-CM | POA: Diagnosis not present

## 2022-04-04 DIAGNOSIS — E785 Hyperlipidemia, unspecified: Secondary | ICD-10-CM | POA: Insufficient documentation

## 2022-04-04 DIAGNOSIS — Z992 Dependence on renal dialysis: Secondary | ICD-10-CM | POA: Diagnosis not present

## 2022-04-04 DIAGNOSIS — N250 Renal osteodystrophy: Secondary | ICD-10-CM | POA: Diagnosis not present

## 2022-04-04 DIAGNOSIS — I214 Non-ST elevation (NSTEMI) myocardial infarction: Secondary | ICD-10-CM | POA: Insufficient documentation

## 2022-04-04 DIAGNOSIS — D631 Anemia in chronic kidney disease: Secondary | ICD-10-CM | POA: Diagnosis not present

## 2022-04-04 DIAGNOSIS — D509 Iron deficiency anemia, unspecified: Secondary | ICD-10-CM | POA: Diagnosis not present

## 2022-04-04 DIAGNOSIS — N186 End stage renal disease: Secondary | ICD-10-CM | POA: Diagnosis not present

## 2022-04-04 DIAGNOSIS — E559 Vitamin D deficiency, unspecified: Secondary | ICD-10-CM | POA: Diagnosis not present

## 2022-04-04 NOTE — H&P (View-Only) (Signed)
Cardiology Office Note   Date:  04/05/2022   ID:  [name], DOB 04/27/1951, MRN [id number]  PCP:  [name], MD  Cardiologist:   Minus Breeding, MD   Chief Complaint  Patient presents with   Shortness of Breath        History of Present Illness: [name] is a 71 y.o. female who presents for follow up of CAD, diastolic heart failure with diastolic dysfunction and atrial fib.   She was in the hospital with a NSTEMI.  She had PCI as below.  She had atrial fib and was treated with DOAC.   Since I saw her in the hospital with her non-STEMI she has been back again with atrial fibrillation rapid rate and I reviewed these records for this visit.  She had some excess volume and required an additional dialysis.  She had Cardizem to control her rate.  This was IV.  Of note she has had some bleeding issues with her dialysis access and had to hold her Eliquis for several days.  She actually restarted it about 4 days ago.  Since these 2 hospitalizations in April she has been weak.  She is walking with a cane and says that her legs have just been tired and achy.  She has a little bit of ankle edema.  She has been more short of breath with activities although she is not describing PND or orthopnea.  She does not really notice her palpitations and she is not having any presyncope or syncope.  Past Medical History:  Diagnosis Date   Acute on chronic diastolic CHF (congestive heart failure) ([place]) 03/05/2015   Grade 2. EF 60-65%   Anemia    hx of   Anxiety    Arthritis    CAD (coronary artery disease)    s/p Stenting in 2005;  [place] 7/09: Vigorous LV function, 2-3+ MR, RI 40%, RI stent patent, proximal-mid RCA 40-50%;  Echo 7/09:  EF 55-65%, trivial MR;  Myoview 11/18/12: EF 66%, normal LV wall motion, mild to moderate anterior ischemia - reviewed by [facility] and felt to rep breast atten (low risk);  Echo 6/14: mild LVH, EF 55-65%, Gr 2 DD, PASP 44, trivial eff    Chronic kidney disease     CREATININE IS UP--GOING TO KIDNEY MD    Chronic neck pain    COPD (chronic obstructive pulmonary disease) (HCC)    Degenerative joint disease    left shoulder   Diabetic neuropathy (HCC)    Diverticulosis    Esophageal stricture    GERD (gastroesophageal reflux disease)    Hyperlipidemia    Hypertension    IDDM (insulin dependent diabetes mellitus)    Pericardial effusion 03/06/2015   Very small per echo ; pleural nodules seen on CT chest.   Peripheral vascular disease ([place])    Stroke ([place])    "mini- years ago"   Vitamin D deficiency     Past Surgical History:  Procedure Laterality Date   A/V FISTULAGRAM N/A 08/10/2017   Procedure: A/V Fistulagram - Right Arm;  Surgeon: Elam Dutch, MD;  Location: [place] [facility];  Service: Cardiovascular;  Laterality: N/A;   A/V FISTULAGRAM N/A 03/03/2020   Procedure: A/V FISTULAGRAM - Right Arm;  Surgeon: Marty Heck, MD;  Location: [place] [facility];  Service: Cardiovascular;  Laterality: N/A;   ABDOMINAL HYSTERECTOMY     AV FISTULA PLACEMENT Right 07/20/2015   Procedure: RADIAL CEPHALIC ARTERIOVENOUS FISTULA CREATION RIGHT  ARM;  Surgeon: Angelia Mould, MD;  [facility] OR;  Service: Vascular;  Laterality: Right;   AV FISTULA PLACEMENT Right 11/26/2015   Procedure:  Creation of RIGHT BRACHIO-CEPHALIC arteriovenous fistula;  Surgeon: Angelia Mould, MD;  Location: [facility] OR;  Service: Vascular;  Laterality: Right;   BACK SURGERY     2007 & 2013   [place]   CARDIAC CATHETERIZATION  2003, 2005, 2009   1 stent   cardiac stents     CATARACT EXTRACTION W/PHACO Right 05/04/2014   Procedure: CATARACT EXTRACTION PHACO AND INTRAOCULAR LENS PLACEMENT ([place]);  Surgeon: Williams Che, MD;  [facility];  Service: Ophthalmology;  Laterality: Right;  CDE 1.47   CATARACT EXTRACTION W/PHACO Left 07/20/2014   Procedure: CATARACT EXTRACTION WITH PHACO AND INTRAOCULAR LENS PLACEMENT LEFT EYE CDE=1.79;  Surgeon:  Williams Che, MD;  Location: [facility];  Service: Ophthalmology;  Laterality: Left;   CERVICAL SPINE SURGERY  2012   8 screws   CORONARY ATHERECTOMY N/A 03/08/2022   Procedure: CORONARY ATHERECTOMY;  Surgeon: Sherren Mocha, MD;  Location: [place] [facility];  Service: Cardiovascular;  Laterality: N/A;   CYSTOSCOPY WITH INJECTION N/A 06/03/2013   Procedure: MACROPLASTIQUE  INJECTION ;  Surgeon: Malka So, MD;  [facility];  Service: Urology;  Laterality: N/A;   FISTULOGRAM Right 10/20/2016   Procedure: FISTULOGRAM WITH POSSIBLE INTERVENTION;  Surgeon: [name], MD;  Location: [facility];  Service: Vascular;  Laterality: Right;   INSERTION OF DIALYSIS CATHETER N/A 11/26/2015   Procedure: INSERTION OF DIALYSIS CATHETER;  Surgeon: Angelia Mould, MD;  Location: [place];  Service: Vascular;  Laterality: N/A;   INTRAVASCULAR IMAGING/OCT N/A 03/08/2022   Procedure: INTRAVASCULAR IMAGING/OCT;  Surgeon: Sherren Mocha, MD;  Location: [place] [facility];  Service: Cardiovascular;  Laterality: N/A;   LAMINECTOMY  12/29/2011   LEFT HEART CATH AND CORONARY ANGIOGRAPHY N/A 03/06/2022   Procedure: LEFT HEART CATH AND CORONARY ANGIOGRAPHY;  Surgeon: Nelva Bush, MD;  Location: [place] [facility];  Service: Cardiovascular;  Laterality: N/A;   LUMBAR LAMINECTOMY/DECOMPRESSION MICRODISCECTOMY  12/29/2011   Procedure: LUMBAR LAMINECTOMY/DECOMPRESSION MICRODISCECTOMY;  Surgeon: Floyce Stakes, MD;  Location: [place] [facility];  Service: Neurosurgery;  Laterality: N/A;  Lumbar Two through Lumbar Five Laminectomies/Cellsaver   PARTIAL HYSTERECTOMY     PERIPHERAL VASCULAR BALLOON ANGIOPLASTY  03/03/2020   Procedure: PERIPHERAL VASCULAR BALLOON ANGIOPLASTY;  Surgeon: Marty Heck, MD;  Location: [place] [facility];  Service: Cardiovascular;;  Rt arm fistula   PERIPHERAL VASCULAR BALLOON ANGIOPLASTY Right 04/15/2021   Procedure: PERIPHERAL VASCULAR BALLOON ANGIOPLASTY;  Surgeon: Angelia Mould, MD;  Location: [place] [facility];  Service: Cardiovascular;  Laterality: Right;  arm fistula   PERIPHERAL VASCULAR BALLOON ANGIOPLASTY  03/14/2022   Procedure: PERIPHERAL VASCULAR BALLOON ANGIOPLASTY;  Surgeon: Broadus John, MD;  Location: [place] [facility];  Service: Cardiovascular;;  right arm AVF   PERIPHERAL VASCULAR CATHETERIZATION N/A 11/22/2015   Procedure: Fistulagram;  Surgeon: Angelia Mould, MD;  Location: [place] [facility];  Service: Cardiovascular;  Laterality: N/A;   PERIPHERAL VASCULAR INTERVENTION  08/10/2017   Procedure: PERIPHERAL VASCULAR INTERVENTION;  Surgeon: Elam Dutch, MD;  Location: [place] [facility];  Service: Cardiovascular;;   SHOULDER SURGERY Right    Rotator cuff     Current Outpatient Medications  Medication Sig Dispense Refill   acetaminophen (TYLENOL) 500 MG tablet Take 1,000 mg by mouth every  6 (six) hours as needed for moderate pain or headache.     albuterol (PROVENTIL) (2.5 MG/3ML) 0.083% nebulizer solution Take 3 mLs (2.5 mg total) by nebulization every 6 (six) hours as needed for wheezing or shortness of breath. 75 mL 12   albuterol (VENTOLIN HFA) 108 (90 Base) MCG/ACT inhaler Inhale 2 puffs into the lungs every 6 (six) hours as needed for wheezing or shortness of breath. 1 each 2   atenolol (TENORMIN) 100 MG tablet Take 1 tablet (100 mg total) by mouth at bedtime. 30 tablet 0   budesonide-formoterol (SYMBICORT) 160-4.5 MCG/ACT inhaler Inhale 2 puffs into the lungs 2 (two) times daily. 3 each 4   calcium acetate (PHOSLO) 667 MG capsule Take 667-1,334 mg by mouth See admin instructions. Take 1334 mg with meals 3 times daily and 667 mg with snacks     Carboxymethylcellul-Glycerin (LUBRICATING EYE DROPS OP) Place 1 drop into both eyes daily as needed (dry eyes).     cetirizine (ZYRTEC) 10 MG tablet Take 1 tablet (10 mg total) by mouth daily as needed for allergies. 90 tablet 1   Continuous Blood Gluc Receiver (FREESTYLE LIBRE 2 READER)  DEVI USE TO TEST BLOOD SUGAR CONTINUOUSLY 1 each 2   Continuous Blood Gluc Sensor (FREESTYLE LIBRE 2 SENSOR) MISC Use multiple times daily to track glucose to prevent highs and lows as a complication of dialysis Dx:Z99.2, E11.59 2 each 12   diltiazem (CARDIZEM CD) 240 MG 24 hr capsule Take 1 capsule (240 mg total) by mouth daily. 30 capsule 0   famotidine (PEPCID) 20 MG tablet Take 1 tablet (20 mg total) by mouth 2 (two) times daily. (Patient taking differently: Take 20 mg by mouth at bedtime.) 30 tablet 0   gabapentin (NEURONTIN) 400 MG capsule Take 2 capsules (800 mg total) by mouth See admin instructions. Take it after HD 30 capsule 1   glucose blood (ONETOUCH ULTRA) test strip Use to test blood sugar 3 times daily as directed. DX: E11.9 300 each 12   hydrOXYzine (ATARAX) 50 MG tablet Take 1 tablet (50 mg total) by mouth 3 (three) times daily as needed for anxiety. 90 tablet 2   Insulin Glargine (BASAGLAR KWIKPEN) 100 UNIT/ML Inject 25 Units into the skin at bedtime. Add five units each time the fasting is over 125 three days in a row. 45 mL 11   nitroGLYCERIN (NITROSTAT) 0.4 MG SL tablet Place 1 tablet (0.4 mg total) under the tongue every 5 (five) minutes as needed for chest pain. 25 tablet 11   omeprazole (PRILOSEC) 20 MG capsule Take 1 capsule (20 mg total) by mouth daily. 90 capsule 3   ondansetron (ZOFRAN) 4 MG tablet TAKE 1 TABLET BY MOUTH EVERY 8 HOURS AS NEEDED FOR NAUSEA AND VOMITING 20 tablet 0   polyethylene glycol (MIRALAX / GLYCOLAX) 17 g packet Take 17 g by mouth daily as needed for moderate constipation or mild constipation. 14 each 0   QUEtiapine (SEROQUEL) 200 MG tablet Take 1 tablet (200 mg total) by mouth at bedtime. For sleep and for anxiety 90 tablet 1   rOPINIRole (REQUIP) 1 MG tablet Take 1 tablet (1 mg total) by mouth at bedtime. For leg cramps 90 tablet 3   rosuvastatin (CRESTOR) 5 MG tablet Take 1 tablet (5 mg total) by mouth daily. For cholesterol 90 tablet 2    Semaglutide,0.25 or 0.5MG /DOS, (OZEMPIC, 0.25 OR 0.5 MG/DOSE,) 2 MG/1.5ML SOPN Inject 0.5 mg into the skin every Friday.     torsemide (  DEMADEX) 20 MG tablet Take 20 mg by mouth daily.     apixaban (ELIQUIS) 5 MG TABS tablet Take 1 tablet (5 mg total) by mouth 2 (two) times daily. 180 tablet 3   ticagrelor (BRILINTA) 90 MG TABS tablet Take 1 tablet (90 mg total) by mouth 2 (two) times daily. 60 tablet 1   No current facility-administered medications for this visit.    Allergies:   Azithromycin, Ace inhibitors, Clopidogrel bisulfate, Codeine, Lisinopril, and Penicillins   ROS:  Please see the history of present illness.   Otherwise, review of systems are positive for none.   All other systems are reviewed and negative.    PHYSICAL EXAM: VS:  BP 124/60   Pulse 75   Ht 5\' 3"  (1.6 m)   Wt 183 lb 6.4 oz (83.2 kg)   SpO2 96%   BMI 32.49 kg/m  , BMI Body mass index is 32.49 kg/m. GENERAL:  Well appearing NECK:  No jugular venous distention, waveform within normal limits, carotid upstroke brisk and symmetric, no bruits, no thyromegaly LUNGS:  Clear to auscultation bilaterally CHEST:  Unremarkable HEART:  PMI not displaced or sustained,S1 and S2 within normal limits, no S3, no clicks, no rubs, soft apical systolic murmur murmurs, irregular  ABD:  Flat, positive bowel sounds normal in frequency in pitch, no bruits, no rebound, no guarding, no midline pulsatile mass, no hepatomegaly, no splenomegaly EXT:  2 plus pulses throughout, no edema, no cyanosis no clubbing, dialysis fistula right upper arm   EKG:  EKG is  ordered today. Atrial fibrillation, rate 75, leftward axis, poor anterior R wave progression, QTc prolonged.  Recent Labs: 03/22/2022: TSH 3.290 03/26/2022: ALT 18; B Natriuretic Peptide 2,276.0 03/28/2022: Magnesium 2.2 03/31/2022: BUN 25; Creatinine, Ser 4.79; Hemoglobin 10.1; Platelets 301; Potassium 3.9; Sodium 134    Lipid Panel    Component Value Date/Time   CHOL 127  03/06/2022 0616   CHOL 203 (H) 01/31/2021 1001   CHOL 241 (H) 03/25/2013 1026   TRIG 95 03/06/2022 0616   TRIG 147 10/09/2013 1527   TRIG 209 (H) 03/25/2013 1026   HDL 44 03/06/2022 0616   HDL 36 (L) 01/31/2021 1001   HDL 65 10/09/2013 1527   HDL 51 03/25/2013 1026   CHOLHDL 2.9 03/06/2022 0616   VLDL 19 03/06/2022 0616   LDLCALC 64 03/06/2022 0616   LDLCALC 124 (H) 01/31/2021 1001   LDLCALC 125 (H) 10/09/2013 1527   LDLCALC 148 (H) 03/25/2013 1026      Wt Readings from Last 3 Encounters:  04/05/22 183 lb 6.4 oz (83.2 kg)  03/31/22 179 lb 7.3 oz (81.4 kg)  03/22/22 185 lb 6.4 oz (84.1 kg)    Cardiac cath   Diagnostic Dominance: Right Intervention    Other studies Reviewed: Additional studies/ records that were reviewed today include:   Hospital records Review of the above records demonstrates:  Please see elsewhere in the note.     ASSESSMENT AND PLAN:    NSTEMI:  Per cardiology's plan is to continue Eliquis and Brilinta for 90 days from 03/08/2022 then switch Brilinta to aspirin 81 mg daily.  I will see her prior to making this switch.   Atrial fib: She had to have her Eliquis interrupted.  Was just restarted on Saturday so it will be 3 weeks from the timing of this that she can have a cardioversion and we will arrange this.  CAD: As above.  She will continue with risk reduction  Chronic diastolic HF:  I think she has excess volume that needs to be taken off of dialysis and I have asked her to speak with her nephrologist.  She has little ankle edema.  She is ot making urine anymore.  She will speak with nephrology.  Volume management per dialysis  Pericardial effusion: This was small previously.  I will follow this with repeat echocardiography in the future.   Essential hypertension:   Her blood pressure is controlled.  She will continue with meds as listed.  ESRD: Again she will talk to her nephrologist about the goals of her dialysis and volume removal.    Dyslipidemia: LDL was 64 with an HDL of 44.  No change in therapy.  Current medicines are reviewed at length with the patient today.  The patient does not have concerns regarding medicines.  The following changes have been made:  None  Labs/ tests ordered today include:    Orders Placed This Encounter  Procedures   Basic metabolic panel   CBC   EKG 12-Lead     Disposition:   FU with me after the cardioversion.  I will see him in [place].   Signed, Minus Breeding, MD  04/05/2022 12:02 PM     [facility]

## 2022-04-04 NOTE — Progress Notes (Signed)
?  ?Cardiology Office Note ? ? ?Date:  04/05/2022  ? ?ID:  [name], DOB Feb 03, 1951, MRN [id number] ? ?PCP:  [name], MD  ?Cardiologist:   Minus Breeding, MD ? ? ?Chief Complaint  ?Patient presents with  ? Shortness of Breath  ? ? ? ? ?  ?History of Present Illness: ?[name] is a 71 y.o. female who presents for follow up of CAD, diastolic heart failure with diastolic dysfunction and atrial fib.   She was in the hospital with a NSTEMI.  She had PCI as below.  She had atrial fib and was treated with DOAC.   Since I saw her in the hospital with her non-STEMI she has been back again with atrial fibrillation rapid rate and I reviewed these records for this visit.  She had some excess volume and required an additional dialysis.  She had Cardizem to control her rate.  This was IV.  Of note she has had some bleeding issues with her dialysis access and had to hold her Eliquis for several days.  She actually restarted it about 4 days ago. ? ?Since these 2 hospitalizations in April she has been weak.  She is walking with a cane and says that her legs have just been tired and achy.  She has a little bit of ankle edema.  She has been more short of breath with activities although she is not describing PND or orthopnea.  She does not really notice her palpitations and she is not having any presyncope or syncope. ? ?Past Medical History:  ?Diagnosis Date  ? Acute on chronic diastolic CHF (congestive heart failure) ([place]) 03/05/2015  ? Grade 2. EF 60-65%  ? Anemia   ? hx of  ? Anxiety   ? Arthritis   ? CAD (coronary artery disease)   ? s/p Stenting in 2005;  [place] 7/09: Vigorous LV function, 2-3+ MR, RI 40%, RI stent patent, proximal-mid RCA 40-50%;  [place] 7/09:  EF 55-65%, trivial MR;  Myov[place] 11/18/12: EF 66%, normal LV wall motion, mild to moderate anterior ischemia - reviewed by [facility] and felt to rep breast atten (low risk);  [place] 6/14: mild LVH, EF 55-65%, Gr 2 DD, PASP 44, trivial eff   ? Chronic kidney disease   ?  CREATININE IS UP--GOING TO KIDNEY MD   ? Chronic neck pain   ? COPD (chronic obstructive pulmonary disease) ([place])   ? Degenerative joint disease   ? left shoulder  ? Diabetic neuropathy ([place])   ? Diverticulosis   ? Esophageal stricture   ? GERD (gastroesophageal reflux disease)   ? Hyperlipidemia   ? Hypertension   ? IDDM (insulin dependent diabetes mellitus)   ? Pericardial effusion 03/06/2015  ? Very small per echo ; pleural nodules seen on CT chest.  ? Peripheral vascular disease ([place])   ? Stroke [facility])   ? "mini- years ago"  ? Vitamin D deficiency   ? ? ?Past Surgical History:  ?Procedure Laterality Date  ? A/V FISTULAGRAM N/A 08/10/2017  ? Procedure: A/V Fistulagram - Right Arm;  Surgeon: Elam Dutch, MD;  Location: [place] [facility];  Service: Cardiovascular;  Laterality: N/A;  ? A/V FISTULAGRAM N/A 03/03/2020  ? Procedure: A/V FISTULAGRAM - Right Arm;  Surgeon: Marty Heck, MD;  Location: [place] [facility];  Service: Cardiovascular;  Laterality: N/A;  ? ABDOMINAL HYSTERECTOMY    ? AV FISTULA PLACEMENT Right 07/20/2015  ? Procedure: RADIAL CEPHALIC ARTERIOVENOUS FISTULA CREATION RIGHT  ARM;  Surgeon: Angelia Mould, MD;  Location: [place];  Service: Vascular;  Laterality: Right;  ? AV FISTULA PLACEMENT Right 11/26/2015  ? Procedure:  Creation of RIGHT BRACHIO-CEPHALIC arteriovenous fistula;  Surgeon: Angelia Mould, MD;  Location: [place];  Service: Vascular;  Laterality: Right;  ? BACK SURGERY    ? 2007 & 2013  ? BLADDER SURGERY    ? [place]  ? CARDIAC CATHETERIZATION  2003, 2005, 2009  ? 1 stent  ? cardiac stents    ? CATARACT EXTRACTION W/PHACO Right 05/04/2014  ? Procedure: CATARACT EXTRACTION PHACO AND INTRAOCULAR LENS PLACEMENT ([facility]);  Surgeon: Williams Che, MD;  [facility];  Service: Ophthalmology;  Laterality: Right;  CDE 1.47  ? CATARACT EXTRACTION W/PHACO Left 07/20/2014  ? Procedure: CATARACT EXTRACTION WITH PHACO AND INTRAOCULAR LENS PLACEMENT LEFT EYE CDE=1.79;  Surgeon:  Williams Che, MD;  [facility];  Service: Ophthalmology;  Laterality: Left;  ? CERVICAL SPINE SURGERY  2012  ? 8 screws  ? CORONARY ATHERECTOMY N/A 03/08/2022  ? Procedure: CORONARY ATHERECTOMY;  Surgeon: Sherren Mocha, MD;  Location: [place] [facility];  Service: Cardiovascular;  Laterality: N/A;  ? CYSTOSCOPY WITH INJECTION N/A 06/03/2013  ? Procedure: MACROPLASTIQUE  INJECTION ;  Surgeon: Malka So, MD;  [facility];  Service: Urology;  Laterality: N/A;  ? FISTULOGRAM Right 10/20/2016  ? Procedure: FISTULOGRAM WITH POSSIBLE INTERVENTION;  Surgeon: [name], MD;  [facility];  Service: Vascular;  Laterality: Right;  ? INSERTION OF DIALYSIS CATHETER N/A 11/26/2015  ? Procedure: INSERTION OF DIALYSIS CATHETER;  Surgeon: Angelia Mould, MD;  Location: [place];  Service: Vascular;  Laterality: N/A;  ? INTRAVASCULAR IMAGING/OCT N/A 03/08/2022  ? Procedure: INTRAVASCULAR IMAGING/OCT;  Surgeon: Sherren Mocha, MD;  Location: [place] [facility];  Service: Cardiovascular;  Laterality: N/A;  ? LAMINECTOMY  12/29/2011  ? LEFT HEART CATH AND CORONARY ANGIOGRAPHY N/A 03/06/2022  ? Procedure: LEFT HEART CATH AND CORONARY ANGIOGRAPHY;  Surgeon: Nelva Bush, MD;  Location: [place] [facility];  Service: Cardiovascular;  Laterality: N/A;  ? LUMBAR LAMINECTOMY/DECOMPRESSION MICRODISCECTOMY  12/29/2011  ? Procedure: LUMBAR LAMINECTOMY/DECOMPRESSION MICRODISCECTOMY;  Surgeon: Floyce Stakes, MD;  Location: [place] [facility];  Service: Neurosurgery;  Laterality: N/A;  Lumbar Two through Lumbar Five Laminectomies/Cellsaver  ? PARTIAL HYSTERECTOMY    ? PERIPHERAL VASCULAR BALLOON ANGIOPLASTY  03/03/2020  ? Procedure: PERIPHERAL VASCULAR BALLOON ANGIOPLASTY;  Surgeon: Marty Heck, MD;  Location: [place] [facility];  Service: Cardiovascular;;  Rt arm fistula  ? PERIPHERAL VASCULAR BALLOON ANGIOPLASTY Right 04/15/2021  ? Procedure: PERIPHERAL VASCULAR BALLOON ANGIOPLASTY;  Surgeon: Angelia Mould, MD;  Location: [place] [facility];  Service: Cardiovascular;  Laterality: Right;  arm fistula  ? PERIPHERAL VASCULAR BALLOON ANGIOPLASTY  03/14/2022  ? Procedure: PERIPHERAL VASCULAR BALLOON ANGIOPLASTY;  Surgeon: Broadus John, MD;  Location: [place] [facility];  Service: Cardiovascular;;  right arm AVF  ? PERIPHERAL VASCULAR CATHETERIZATION N/A 11/22/2015  ? Procedure: Fistulagram;  Surgeon: Angelia Mould, MD;  Location: [place] [facility];  Service: Cardiovascular;  Laterality: N/A;  ? PERIPHERAL VASCULAR INTERVENTION  08/10/2017  ? Procedure: PERIPHERAL VASCULAR INTERVENTION;  Surgeon: Elam Dutch, MD;  Location: [place] [facility];  Service: Cardiovascular;;  ? SHOULDER SURGERY Right   ? Rotator cuff  ? ? ? ?Current Outpatient Medications  ?Medication Sig Dispense Refill  ? acetaminophen (TYLENOL) 500 MG tablet Take 1,000 mg by mouth every  6 (six) hours as needed for moderate pain or headache.    ? albuterol (PROVENTIL) (2.5 MG/3ML) 0.083% nebulizer solution Take 3 mLs (2.5 mg total) by nebulization every 6 (six) hours as needed for wheezing or shortness of breath. 75 mL 12  ? albuterol (VENTOLIN HFA) 108 (90 Base) MCG/ACT inhaler Inhale 2 puffs into the lungs every 6 (six) hours as needed for wheezing or shortness of breath. 1 each 2  ? atenolol (TENORMIN) 100 MG tablet Take 1 tablet (100 mg total) by mouth at bedtime. 30 tablet 0  ? budesonide-formoterol (SYMBICORT) 160-4.5 MCG/ACT inhaler Inhale 2 puffs into the lungs 2 (two) times daily. 3 each 4  ? calcium acetate (PHOSLO) 667 MG capsule Take 667-1,334 mg by mouth See admin instructions. Take 1334 mg with meals 3 times daily and 667 mg with snacks    ? Carboxymethylcellul-Glycerin (LUBRICATING EYE DROPS OP) Place 1 drop into both eyes daily as needed (dry eyes).    ? cetirizine (ZYRTEC) 10 MG tablet Take 1 tablet (10 mg total) by mouth daily as needed for allergies. 90 tablet 1  ? Continuous Blood Gluc Receiver (FREESTYLE LIBRE 2 READER)  DEVI USE TO TEST BLOOD SUGAR CONTINUOUSLY 1 each 2  ? Continuous Blood Gluc Sensor (FREESTYLE LIBRE 2 SENSOR) MISC Use multiple times daily to track glucose to prevent highs and lows as a complication of d

## 2022-04-05 ENCOUNTER — Ambulatory Visit (INDEPENDENT_AMBULATORY_CARE_PROVIDER_SITE_OTHER): Payer: Medicare Other | Admitting: Cardiology

## 2022-04-05 ENCOUNTER — Encounter: Payer: Self-pay | Admitting: Cardiology

## 2022-04-05 VITALS — BP 124/60 | HR 75 | Ht 63.0 in | Wt 183.4 lb

## 2022-04-05 DIAGNOSIS — I70219 Atherosclerosis of native arteries of extremities with intermittent claudication, unspecified extremity: Secondary | ICD-10-CM | POA: Diagnosis not present

## 2022-04-05 DIAGNOSIS — E785 Hyperlipidemia, unspecified: Secondary | ICD-10-CM | POA: Diagnosis not present

## 2022-04-05 DIAGNOSIS — I4891 Unspecified atrial fibrillation: Secondary | ICD-10-CM | POA: Diagnosis not present

## 2022-04-05 DIAGNOSIS — I5032 Chronic diastolic (congestive) heart failure: Secondary | ICD-10-CM

## 2022-04-05 DIAGNOSIS — I214 Non-ST elevation (NSTEMI) myocardial infarction: Secondary | ICD-10-CM

## 2022-04-05 MED ORDER — APIXABAN 5 MG PO TABS: 5.0000 mg | ORAL_TABLET | Freq: Two times a day (BID) | ORAL | 3 refills | Status: AC

## 2022-04-05 MED ORDER — TICAGRELOR 90 MG PO TABS: 90.0000 mg | ORAL_TABLET | Freq: Two times a day (BID) | ORAL | 1 refills | Status: DC

## 2022-04-05 NOTE — Patient Instructions (Signed)
?  You are scheduled for a Cardioversion on Monday, May 22nd with Dr. Sallyanne Kuster.  Please arrive at the [facility] (Main Entrance A) at [facility]: [address] [place], [place] [address] at 9 AM  ? ?DIET: Nothing to eat or drink after midnight except a sip of water with medications (see medication instructions below) ? ?FYI: For your safety, and to allow [place] to monitor your vital signs accurately during the surgery/procedure we request that   ?if you have artificial nails, gel coating, SNS etc. Please have those removed prior to your surgery/procedure. Not having the nail coverings /polish removed may result in cancellation or delay of your surgery/procedure. ? ? ?Medication Instructions: ? ?Take 1/2 of your usual dose of insulin the night before ? NO insulin the morning of procedure ? ?Hold torsemide AM of procedure ? ?Continue your anticoagulant: Eliquis ?You will need to continue your anticoagulant after your procedure until you are told by your  ?Provider that it is safe to stop ? ? ?Labs: Please return for labs 1 week prior to procedure (BMET, CBC) ? ?Our in office lab hours are Monday-Friday 8:00-4:00, closed for lunch 12:45-1:45 pm.  No appointment needed. ? ?[facility] locations: ?  ?[place] ?- [address] (Dr. Kennon Holter office) ?- [place] ([facility]) ?- [address] ?- [place]  ?[place] ?- [address]  ?  ?[place]  ?- [place] [address]  ?  ?[place] ?- [place]  ?[place]  ?- [place] ?- [place] [address] ([facility]) ? ? ?You must have a responsible person to drive you home and stay in the waiting area during your procedure. Failure to do so could result in cancellation. ? ?Interior and spatial designer cards. ? ?*Special Note: Every effort is made to have your procedure done on time. Occasionally there are emergencies that  occur at the hospital that may cause delays. Please be patient if a delay does occur.  ? ? ?Follow up with Dr. Percival Spanish in [place] after procedure ? ?

## 2022-04-06 DIAGNOSIS — D509 Iron deficiency anemia, unspecified: Secondary | ICD-10-CM | POA: Diagnosis not present

## 2022-04-06 DIAGNOSIS — D631 Anemia in chronic kidney disease: Secondary | ICD-10-CM | POA: Diagnosis not present

## 2022-04-06 DIAGNOSIS — N2581 Secondary hyperparathyroidism of renal origin: Secondary | ICD-10-CM | POA: Diagnosis not present

## 2022-04-06 DIAGNOSIS — Z992 Dependence on renal dialysis: Secondary | ICD-10-CM | POA: Diagnosis not present

## 2022-04-06 DIAGNOSIS — N250 Renal osteodystrophy: Secondary | ICD-10-CM | POA: Diagnosis not present

## 2022-04-06 DIAGNOSIS — N186 End stage renal disease: Secondary | ICD-10-CM | POA: Diagnosis not present

## 2022-04-07 ENCOUNTER — Other Ambulatory Visit: Payer: Self-pay | Admitting: Family Medicine

## 2022-04-07 DIAGNOSIS — Z48812 Encounter for surgical aftercare following surgery on the circulatory system: Secondary | ICD-10-CM | POA: Diagnosis not present

## 2022-04-07 DIAGNOSIS — I4891 Unspecified atrial fibrillation: Secondary | ICD-10-CM | POA: Diagnosis not present

## 2022-04-07 DIAGNOSIS — E1159 Type 2 diabetes mellitus with other circulatory complications: Secondary | ICD-10-CM | POA: Diagnosis not present

## 2022-04-07 DIAGNOSIS — E1122 Type 2 diabetes mellitus with diabetic chronic kidney disease: Secondary | ICD-10-CM | POA: Diagnosis not present

## 2022-04-07 DIAGNOSIS — I152 Hypertension secondary to endocrine disorders: Secondary | ICD-10-CM | POA: Diagnosis not present

## 2022-04-07 DIAGNOSIS — I5033 Acute on chronic diastolic (congestive) heart failure: Secondary | ICD-10-CM | POA: Diagnosis not present

## 2022-04-08 ENCOUNTER — Encounter (HOSPITAL_COMMUNITY): Payer: Self-pay | Admitting: Emergency Medicine

## 2022-04-08 ENCOUNTER — Other Ambulatory Visit: Payer: Self-pay

## 2022-04-08 ENCOUNTER — Emergency Department (HOSPITAL_COMMUNITY)
Admission: EM | Admit: 2022-04-08 | Discharge: 2022-04-08 | Disposition: A | Discharge status: Home / Self Care | Payer: Medicare Other | Attending: Emergency Medicine | Admitting: Emergency Medicine

## 2022-04-08 DIAGNOSIS — D631 Anemia in chronic kidney disease: Secondary | ICD-10-CM | POA: Diagnosis not present

## 2022-04-08 DIAGNOSIS — N250 Renal osteodystrophy: Secondary | ICD-10-CM | POA: Diagnosis not present

## 2022-04-08 DIAGNOSIS — Z992 Dependence on renal dialysis: Secondary | ICD-10-CM | POA: Diagnosis not present

## 2022-04-08 DIAGNOSIS — R030 Elevated blood-pressure reading, without diagnosis of hypertension: Secondary | ICD-10-CM | POA: Diagnosis not present

## 2022-04-08 DIAGNOSIS — D509 Iron deficiency anemia, unspecified: Secondary | ICD-10-CM | POA: Diagnosis not present

## 2022-04-08 DIAGNOSIS — Z7901 Long term (current) use of anticoagulants: Secondary | ICD-10-CM | POA: Insufficient documentation

## 2022-04-08 DIAGNOSIS — T82838A Hemorrhage of vascular prosthetic devices, implants and grafts, initial encounter: Secondary | ICD-10-CM | POA: Diagnosis not present

## 2022-04-08 DIAGNOSIS — N2581 Secondary hyperparathyroidism of renal origin: Secondary | ICD-10-CM | POA: Diagnosis not present

## 2022-04-08 DIAGNOSIS — R58 Hemorrhage, not elsewhere classified: Secondary | ICD-10-CM

## 2022-04-08 DIAGNOSIS — N186 End stage renal disease: Secondary | ICD-10-CM | POA: Insufficient documentation

## 2022-04-08 NOTE — ED Triage Notes (Signed)
Pt states she had full dialysis treatment today and now has a "raw spot" on her R upper arm at dialysis site that is oozing blood. Bandage in place.  Not bleeding through bandage. ?

## 2022-04-08 NOTE — Discharge Instructions (Signed)
You have been evaluated for superficial bleeding at the fistula site.  Please come back to the emergency department if you notice worsening bleeding.  Follow-up with your primary care provider as needed. ?

## 2022-04-08 NOTE — ED Notes (Signed)
Discharge instructions reviewed with patient. Patient verbalized understanding of instructions. Follow-up care and medications were reviewed. Patient wheeled out to lobby in wheelchair. VSS upon discharge.  ?

## 2022-04-08 NOTE — ED Provider Notes (Signed)
?[facility] ?Provider Note ? ? ?CSN: [id number] ?Arrival date & time: 04/08/22  1400 ? ?  ? ?History ? ?Chief Complaint  ?Patient presents with  ? Vascular Access Problem  ? ? ?[name] is a 71 y.o. female. ? ?HPI ? ?Is a 71 year old female with multiple medical history including end-stage renal disease on hemodialysis who presents to the emergency department due to concern for bleeding near his fistula site.  She reports she woke up this morning and noticed a very small scab on her facial area.  She removed a scab and noticed a very small bleed around.  They put some Band-Aids which stopped the bleeding.  Patient went to get her full dialysis without any problem.  At dialysis they were concerned because patient is on blood thinners and sent her to the emergency department to be evaluated.  Patient reports no new symptoms.  She reports no problems getting her full dialysis today.  She is on blood thinner because of coronary artery stenting a month and a half ago.  Otherwise no other complaints. ? ? ?Home Medications ?Prior to Admission medications   ?Medication Sig Start Date End Date Taking? Authorizing Provider  ?acetaminophen (TYLENOL) 500 MG tablet Take 1,000 mg by mouth every 6 (six) hours as needed for moderate pain or headache.    [provider]  ?albuterol (PROVENTIL) (2.5 MG/3ML) 0.083% nebulizer solution Take 3 mLs (2.5 mg total) by nebulization every 6 (six) hours as needed for wheezing or shortness of breath. 12/02/21   [name], MD  ?albuterol (VENTOLIN HFA) 108 (90 Base) MCG/ACT inhaler Inhale 2 puffs into the lungs every 6 (six) hours as needed for wheezing or shortness of breath. 12/02/21   [name], MD  ?apixaban (ELIQUIS) 5 MG TABS tablet Take 1 tablet (5 mg total) by mouth 2 (two) times daily. 04/05/22   Minus Breeding, MD  ?atenolol (TENORMIN) 100 MG tablet Take 1 tablet (100 mg total) by mouth at bedtime. 03/31/22 04/30/22  Phillips Grout, MD  ?budesonide-formoterol (SYMBICORT) 160-4.5 MCG/ACT inhaler Inhale 2 puffs into the lungs 2 (two) times daily. 11/15/21   [name], MD  ?calcium acetate (PHOSLO) 667 MG capsule Take 667-1,334 mg by mouth See admin instructions. Take 1334 mg with meals 3 times daily and 667 mg with snacks    [provider]  ?Carboxymethylcellul-Glycerin (LUBRICATING EYE DROPS OP) Place 1 drop into both eyes daily as needed (dry eyes).    [provider]  ?cetirizine (ZYRTEC) 10 MG tablet Take 1 tablet (10 mg total) by mouth daily as needed for allergies. 02/08/22   [name], MD  ?Continuous Blood Gluc Receiver (FREESTYLE LIBRE 2 READER) DEVI USE TO TEST BLOOD SUGAR CONTINUOUSLY 11/15/21   [name], MD  ?Continuous Blood Gluc Sensor (FREESTYLE LIBRE 2 SENSOR) MISC Use multiple times daily to track glucose to prevent highs and lows as a complication of dialysis Dx:Z99.2, E11.59 09/15/21   [name], MD  ?diltiazem (CARDIZEM CD) 240 MG 24 hr capsule Take 1 capsule (240 mg total) by mouth daily. 04/01/22 05/01/22  Phillips Grout, MD  ?famotidine (PEPCID) 20 MG tablet Take 1 tablet (20 mg total) by mouth 2 (two) times daily. ?Patient taking differently: Take 20 mg by mouth at bedtime. 02/15/22   Dettinger, Fransisca Kaufmann, MD  ?gabapentin (NEURONTIN) 400 MG capsule Take 2 capsules (800 mg total) by mouth See admin instructions. Take it after HD 03/22/22   [name], MD  ?  glucose blood (ONETOUCH ULTRA) test strip Use to test blood sugar 3 times daily as directed. DX: E11.9 09/13/20   [name], MD  ?hydrOXYzine (ATARAX) 50 MG tablet Take 1 tablet (50 mg total) by mouth 3 (three) times daily as needed for anxiety. 03/22/22   [name], MD  ?Insulin Glargine [facility] KWIKPEN) 100 UNIT/ML Inject 25 Units into the skin at bedtime. Add five units each time the fasting is over 125 three days in a row. 11/15/21   [name], MD  ?nitroGLYCERIN (NITROSTAT) 0.4 MG SL tablet Place 1  tablet (0.4 mg total) under the tongue every 5 (five) minutes as needed for chest pain. 11/09/21   Minus Breeding, MD  ?omeprazole (PRILOSEC) 20 MG capsule Take 1 capsule (20 mg total) by mouth daily. 12/12/21   [name], MD  ?ondansetron (ZOFRAN) 4 MG tablet TAKE 1 TABLET BY MOUTH EVERY 8 HOURS AS NEEDED FOR NAUSEA AND VOMITING 04/07/22   [name], MD  ?polyethylene glycol (MIRALAX / GLYCOLAX) 17 g packet Take 17 g by mouth daily as needed for moderate constipation or mild constipation. 03/10/22   Pokhrel, Corrie Mckusick, MD  ?QUEtiapine (SEROQUEL) 200 MG tablet Take 1 tablet (200 mg total) by mouth at bedtime. For sleep and for anxiety 03/17/22   [name], MD  ?rOPINIRole (REQUIP) 1 MG tablet Take 1 tablet (1 mg total) by mouth at bedtime. For leg cramps 09/15/21   [name], MD  ?rosuvastatin (CRESTOR) 5 MG tablet Take 1 tablet (5 mg total) by mouth daily. For cholesterol 12/12/21   [name], MD  ?Semaglutide,0.25 or 0.5MG /DOS, (OZEMPIC, 0.25 OR 0.5 MG/DOSE,) 2 MG/1.5ML SOPN Inject 0.5 mg into the skin every Friday.    [provider]  ?ticagrelor (BRILINTA) 90 MG TABS tablet Take 1 tablet (90 mg total) by mouth 2 (two) times daily. 04/05/22 06/04/22  Minus Breeding, MD  ?torsemide (DEMADEX) 20 MG tablet Take 20 mg by mouth daily. 01/19/22   [provider]  ?   ? ?Allergies    ?Azithromycin, Ace inhibitors, Clopidogrel bisulfate, Codeine, Lisinopril, and Penicillins   ? ?Review of Systems   ?Review of Systems ? ?Physical Exam ?Updated Vital Signs ?BP (!) 151/63   Pulse 90   Temp 98 ?F (36.7 ?C) (Oral)   Resp 17   SpO2 100%  ?Physical Exam ?Vitals and nursing note reviewed.  ?Constitutional:   ?   General: She is not in acute distress. ?   Appearance: She is well-developed.  ?HENT:  ?   Head: Normocephalic and atraumatic.  ?Eyes:  ?   Conjunctiva/sclera: Conjunctivae normal.  ?Cardiovascular:  ?   Rate and Rhythm: Normal rate and regular rhythm.  ?   Heart sounds: No murmur  heard. ?Pulmonary:  ?   Effort: Pulmonary effort is normal. No respiratory distress.  ?   Breath sounds: Normal breath sounds.  ?Abdominal:  ?   Palpations: Abdomen is soft.  ?   Tenderness: There is no abdominal tenderness.  ?Musculoskeletal:     ?   General: No swelling.  ?   Cervical back: Neck supple.  ?   Comments: Patient neurovascular intact distally bilaterally.  ?Skin: ?   General: Skin is warm and dry.  ?   Capillary Refill: Capillary refill takes less than 2 seconds.  ?   Comments: Very superficial bleeding on top of AV fistula.  Area becomes immediately hemostatic with minimal pressure.   ?Neurological:  ?   Mental Status: She is alert.  ?  Psychiatric:     ?   Mood and Affect: Mood normal.  ? ? ?ED Results / Procedures / Treatments   ?Labs ?(all labs ordered are listed, but only abnormal results are displayed) ?Labs Reviewed - No data to display ? ?EKG ?None ? ?Radiology ?No results found. ? ?Procedures ?Procedures  ? ?Medications Ordered in ED ?Medications - No data to display ? ?ED Course/ Medical Decision Making/ A&P ?Clinical Course as of 04/08/22 1754  ?Sat Apr 08, 2022  ?7553 71 year old female end-stage renal disease had dialysis earlier today.  Continues to bleed from a small wound on her fistula side.  She is otherwise got no complaints.  She is hoping we can glue it so she can go home. [MB]  ?  ?Clinical Course User Index ?[MB] Hayden Rasmussen, MD  ? ?                        ?Medical Decision Making ?Problems Addressed: ?Bleeding: acute illness or injury with systemic symptoms ? ? ?Patient is 71 year old female presents to the emergency department due to concern for bleed on top of the AV fistula site.  Her physical exam as above.  Vitals unremarkable for mildly elevated blood pressure.  Otherwise no sign of hemodynamic instability.  Less concern for AV fistula malfunction at this time.  Patient has received full dialysis today.  Her bleed is very superficial and I have applied Dermabond with  hemostatic achievement.  Patient continued to remain hemodynamically stable.  Strict return precaution has been discussed.  Patient and family member understand and agrees with plan. ?Final Clinical Impression(s) / ED Sharma Covert

## 2022-04-10 DIAGNOSIS — E1159 Type 2 diabetes mellitus with other circulatory complications: Secondary | ICD-10-CM | POA: Diagnosis not present

## 2022-04-10 DIAGNOSIS — I5033 Acute on chronic diastolic (congestive) heart failure: Secondary | ICD-10-CM | POA: Diagnosis not present

## 2022-04-10 DIAGNOSIS — Z48812 Encounter for surgical aftercare following surgery on the circulatory system: Secondary | ICD-10-CM | POA: Diagnosis not present

## 2022-04-10 DIAGNOSIS — I4891 Unspecified atrial fibrillation: Secondary | ICD-10-CM | POA: Diagnosis not present

## 2022-04-10 DIAGNOSIS — E1122 Type 2 diabetes mellitus with diabetic chronic kidney disease: Secondary | ICD-10-CM | POA: Diagnosis not present

## 2022-04-10 DIAGNOSIS — I152 Hypertension secondary to endocrine disorders: Secondary | ICD-10-CM | POA: Diagnosis not present

## 2022-04-11 ENCOUNTER — Telehealth: Payer: Self-pay | Admitting: Family Medicine

## 2022-04-11 DIAGNOSIS — N2581 Secondary hyperparathyroidism of renal origin: Secondary | ICD-10-CM | POA: Diagnosis not present

## 2022-04-11 DIAGNOSIS — N186 End stage renal disease: Secondary | ICD-10-CM | POA: Diagnosis not present

## 2022-04-11 DIAGNOSIS — N250 Renal osteodystrophy: Secondary | ICD-10-CM | POA: Diagnosis not present

## 2022-04-11 DIAGNOSIS — D631 Anemia in chronic kidney disease: Secondary | ICD-10-CM | POA: Diagnosis not present

## 2022-04-11 DIAGNOSIS — Z992 Dependence on renal dialysis: Secondary | ICD-10-CM | POA: Diagnosis not present

## 2022-04-11 DIAGNOSIS — D509 Iron deficiency anemia, unspecified: Secondary | ICD-10-CM | POA: Diagnosis not present

## 2022-04-11 NOTE — Telephone Encounter (Signed)
PATIENT STATES BRILINTA AND ELIQUIS TOO EXPENSIVE, WHAT DO YOU SUGGEST? ? ?

## 2022-04-12 ENCOUNTER — Encounter: Payer: Self-pay | Admitting: Family Medicine

## 2022-04-12 ENCOUNTER — Ambulatory Visit (INDEPENDENT_AMBULATORY_CARE_PROVIDER_SITE_OTHER): Payer: Medicare Other | Admitting: Family Medicine

## 2022-04-12 VITALS — BP 124/69 | HR 91 | Temp 98.1°F | Ht 63.0 in | Wt 182.6 lb

## 2022-04-12 DIAGNOSIS — I70219 Atherosclerosis of native arteries of extremities with intermittent claudication, unspecified extremity: Secondary | ICD-10-CM | POA: Diagnosis not present

## 2022-04-12 DIAGNOSIS — I10 Essential (primary) hypertension: Secondary | ICD-10-CM | POA: Diagnosis not present

## 2022-04-12 DIAGNOSIS — E1122 Type 2 diabetes mellitus with diabetic chronic kidney disease: Secondary | ICD-10-CM | POA: Diagnosis not present

## 2022-04-12 DIAGNOSIS — N186 End stage renal disease: Secondary | ICD-10-CM | POA: Diagnosis not present

## 2022-04-12 DIAGNOSIS — I4891 Unspecified atrial fibrillation: Secondary | ICD-10-CM | POA: Diagnosis not present

## 2022-04-12 DIAGNOSIS — Z992 Dependence on renal dialysis: Secondary | ICD-10-CM

## 2022-04-12 DIAGNOSIS — I25118 Atherosclerotic heart disease of native coronary artery with other forms of angina pectoris: Secondary | ICD-10-CM | POA: Diagnosis not present

## 2022-04-12 DIAGNOSIS — F411 Generalized anxiety disorder: Secondary | ICD-10-CM | POA: Diagnosis not present

## 2022-04-12 MED ORDER — DILTIAZEM HCL ER COATED BEADS 240 MG PO CP24: 240.0000 mg | ORAL_CAPSULE | Freq: Every day | ORAL | 0 refills | Status: DC

## 2022-04-12 NOTE — Telephone Encounter (Signed)
Pt aware to call cardio  ?

## 2022-04-12 NOTE — Telephone Encounter (Signed)
She will need to speak to her cardiologist. They prescribed, and felt strongly about both. ?

## 2022-04-12 NOTE — Progress Notes (Signed)
? ?Subjective:  ?Patient ID: [name], female    DOB: December 23, 1950  Age: 71 y.o. MRN: [id number] ? ?CC: Follow-up and Anxiety ? ? ?HPI ?[name] presents for Anxiety and worry due to recent illness and hospitalization. BP was dropping so dry weight had to be increased. Now having some edema at ankles.  ? ?Seroquel  makes her feel draggy in the morning. Works well for sleep and anxiety.  She just has trouble getting yourself awake the first hour of the morning. ? ? ?Patient in for follow-up of atrial fibrillation. Patient denies any recent bouts of chest pain since discharge from having her stent placed, she also denies r palpitations. Additionally, patient is taking anticoagulants. Patient reports recent excessive bleeding episodes related to her dialysis fistula in the right upper extremity.  She denies other bleeding including epistaxis, bleeding from the gums, genitalia, rectal bleeding or hematuria.  ? ?  04/12/2022  ? 11:16 AM 03/22/2022  ?  4:07 PM 03/17/2022  ?  2:20 PM 03/01/2022  ?  2:28 PM  ?GAD 7 : Generalized Anxiety Score  ?Nervous, Anxious, on Edge 1 3 3 2   ?Control/stop worrying 0 1 2 0  ?Worry too much - different things 1 1 2  0  ?Trouble relaxing 1 3 2 2   ?Restless 0 0 3 0  ?Easily annoyed or irritable 0 0 0 0  ?Afraid - awful might happen 1 3 3  0  ?Total GAD 7 Score 4 11 15 4   ?Anxiety Difficulty Not difficult at all Somewhat difficult Somewhat difficult Not difficult at all  ? ? ? ? ? ?  04/12/2022  ? 11:16 AM 03/22/2022  ?  4:06 PM 03/17/2022  ?  2:19 PM  ?Depression screen PHQ 2/9  ?Decreased Interest 0 1 0  ?Down, Depressed, Hopeless 0 0 1  ?PHQ - 2 Score 0 1 1  ?Altered sleeping 3 3 3   ?Tired, decreased energy 1 1 0  ?Change in appetite 0 0 1  ?Feeling bad or failure about yourself  0 0 0  ?Trouble concentrating 0 0 0  ?Moving slowly or fidgety/restless 0 0 0  ?Suicidal thoughts 0 0 0  ?PHQ-9 Score 4 5 5   ?Difficult doing work/chores Not difficult at all Somewhat difficult Somewhat  difficult  ? ? ?History ?[name] has a past medical history of Acute on chronic diastolic CHF (congestive heart failure) ([place]) (03/05/2015), Anemia, Anxiety, Arthritis, CAD (coronary artery disease), Chronic kidney disease, Chronic neck pain, COPD (chronic obstructive pulmonary disease) ([place]), Degenerative joint disease, Diabetic neuropathy ([place]), Diverticulosis, Esophageal stricture, GERD (gastroesophageal reflux disease), Hyperlipidemia, Hypertension, IDDM (insulin dependent diabetes mellitus), Pericardial effusion (03/06/2015), Peripheral vascular disease ([place]), Stroke ([place]), and Vitamin D deficiency.  ? ?She has a past surgical history that includes Bladder surgery; Partial hysterectomy; Shoulder surgery (Right); Cervical spine surgery (2012); Abdominal hysterectomy; Laminectomy (12/29/2011); cardiac stents; Lumbar laminectomy/decompression microdiscectomy (12/29/2011); Back surgery; Cystoscopy with injection (N/A, 06/03/2013); Cardiac catheterization (2003, 2005, 2009); Cataract extraction w/PHACO (Right, 05/04/2014); Cataract extraction w/PHACO (Left, 07/20/2014); AV fistula placement (Right, 07/20/2015); Cardiac catheterization (N/A, 11/22/2015); Insertion of dialysis catheter (N/A, 11/26/2015); AV fistula placement (Right, 11/26/2015); Fistulogram (Right, 10/20/2016); A/V Fistulagram (N/A, 08/10/2017); PERIPHERAL VASCULAR INTERVENTION (08/10/2017); A/V Fistulagram (N/A, 03/03/2020); PERIPHERAL VASCULAR BALLOON ANGIOPLASTY (03/03/2020); PERIPHERAL VASCULAR BALLOON ANGIOPLASTY (Right, 04/15/2021); LEFT HEART CATH AND CORONARY ANGIOGRAPHY (N/A, 03/06/2022); CORONARY ATHERECTOMY (N/A, 03/08/2022); INTRAVASCULAR IMAGING/OCT (N/A, 03/08/2022); and PERIPHERAL VASCULAR BALLOON ANGIOPLASTY (03/14/2022).  ? ?Her family history includes Cancer in her father; Diabetes in her  daughter; Heart disease in an other family member; Hypertension in her daughter, daughter, father, and mother; Pancreatitis in her brother.She reports that she quit smoking  about 20 years ago. Her smoking use included cigarettes. She has a 30.00 pack-year smoking history. She has never used smokeless tobacco. She reports that she does not drink alcohol and does not use drugs. ? ? ? ?ROS ?Review of Systems  ?Constitutional: Negative.   ?HENT: Negative.    ?Eyes:  Negative for visual disturbance.  ?Respiratory:  Negative for shortness of breath.   ?Cardiovascular:  Negative for chest pain.  ?Gastrointestinal:  Negative for abdominal pain.  ?Musculoskeletal:  Negative for arthralgias.  ?Hematological:  Negative for adenopathy. Bruises/bleeds easily (Still having episodes of bleeding at the site of her fistula with dialysis.  This is related to the use of both Eliquis and Brilinta.).  ? ?Objective:  ?BP 124/69   Pulse 91   Temp 98.1 ?F (36.7 ?C)   Ht 5\' 3"  (1.6 m)   Wt 182 lb 9.6 oz (82.8 kg)   SpO2 96%   BMI 32.35 kg/m?  ? ?BP Readings from Last 3 Encounters:  ?04/12/22 124/69  ?04/08/22 (!) 151/63  ?04/05/22 124/60  ? ? ?Wt Readings from Last 3 Encounters:  ?04/12/22 182 lb 9.6 oz (82.8 kg)  ?04/05/22 183 lb 6.4 oz (83.2 kg)  ?03/31/22 179 lb 7.3 oz (81.4 kg)  ? ? ? ?Physical Exam ? ? ? ?Assessment & Plan:  ? ?[name] was seen today for follow-up and anxiety. ? ?Diagnoses and all orders for this visit: ? ?Essential hypertension ? ?Atrial fibrillation with controlled ventricular rate ([place]) ? 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 ?GAD (generalized anxiety disorder) ? ?Coronary artery disease of native artery of native heart with stable angina pectoris ([place]) ? ?Other orders ?-     diltiazem (CARDIZEM CD) 240 MG 24 hr capsule; Take 1 capsule (240 mg total) by mouth daily. ? ? ? ? ? ? ?I am having [name] maintain her calcium acetate, acetaminophen, Carboxymethylcellul-Glycerin (LUBRICATING EYE DROPS OP), OneTouch Ultra, rOPINIRole, FreeStyle Libre 2 Sensor, Ozempic (0.25 or 0.5 MG/DOSE), nitroGLYCERIN, FreeStyle Libre 2 Reader,  budesonide-formoterol, Basaglar KwikPen, albuterol, albuterol, omeprazole, rosuvastatin, cetirizine, torsemide, famotidine, polyethylene glycol, QUEtiapine, gabapentin, hydrOXYzine, atenolol, apixaban, ticagrelor, ondansetron, and diltiazem. ? ?Allergies as of 04/12/2022   ? ?   Reactions  ? Azithromycin   ? Prolonged QT  ? Ace Inhibitors Cough  ? Clopidogrel Bisulfate Other (See Comments)  ? Sick, Headache, "Felt terrible"  ? Codeine Other (See Comments)  ? hallucinations  ? Lisinopril Cough  ? Penicillins Other (See Comments)  ? Unknown reaction ?Did it involve swelling of the face/tongue/throat, SOB, or low BP? Unknown ?Did it involve sudden or severe rash/hives, skin peeling, or any reaction on the inside of your mouth or nose? Unknown ?Did you need to seek medical attention at a hospital or doctor's office? Unknown ?When did it last happen?      childhood allergy ?If all above answers are "NO", may proceed with cephalosporin use.  ? ?  ? ?  ?Medication List  ?  ? ?  ? Accurate as of Apr 12, 2022  8:30 PM. If you have any questions, ask your nurse or doctor.  ?  ?  ? ?  ? ?acetaminophen 500 MG tablet ?Commonly known as: TYLENOL ?Take 1,000 mg by mouth every 6 (six) hours as needed for moderate pain or headache. ?  ?  albuterol 108 (90 Base) MCG/ACT inhaler ?Commonly known as: VENTOLIN HFA ?Inhale 2 puffs into the lungs every 6 (six) hours as needed for wheezing or shortness of breath. ?  ?albuterol (2.5 MG/3ML) 0.083% nebulizer solution ?Commonly known as: PROVENTIL ?Take 3 mLs (2.5 mg total) by nebulization every 6 (six) hours as needed for wheezing or shortness of breath. ?  ?apixaban 5 MG Tabs tablet ?Commonly known as: ELIQUIS ?Take 1 tablet (5 mg total) by mouth 2 (two) times daily. ?  ?atenolol 100 MG tablet ?Commonly known as: TENORMIN ?Take 1 tablet (100 mg total) by mouth at bedtime. ?  ?Basaglar KwikPen 100 UNIT/ML ?Inject 25 Units into the skin at bedtime. Add five units each time the fasting is over 125  three days in a row. ?  ?budesonide-formoterol 160-4.5 MCG/ACT inhaler ?Commonly known as: SYMBICORT ?Inhale 2 puffs into the lungs 2 (two) times daily. ?  ?calcium acetate 667 MG capsule ?Commonly known as: PHOSLO ?Take 667

## 2022-04-13 ENCOUNTER — Telehealth: Payer: Self-pay

## 2022-04-13 DIAGNOSIS — D509 Iron deficiency anemia, unspecified: Secondary | ICD-10-CM | POA: Diagnosis not present

## 2022-04-13 DIAGNOSIS — N250 Renal osteodystrophy: Secondary | ICD-10-CM | POA: Diagnosis not present

## 2022-04-13 DIAGNOSIS — E1122 Type 2 diabetes mellitus with diabetic chronic kidney disease: Secondary | ICD-10-CM | POA: Diagnosis not present

## 2022-04-13 DIAGNOSIS — Z48812 Encounter for surgical aftercare following surgery on the circulatory system: Secondary | ICD-10-CM | POA: Diagnosis not present

## 2022-04-13 DIAGNOSIS — N2581 Secondary hyperparathyroidism of renal origin: Secondary | ICD-10-CM | POA: Diagnosis not present

## 2022-04-13 DIAGNOSIS — I5033 Acute on chronic diastolic (congestive) heart failure: Secondary | ICD-10-CM | POA: Diagnosis not present

## 2022-04-13 DIAGNOSIS — Z992 Dependence on renal dialysis: Secondary | ICD-10-CM | POA: Diagnosis not present

## 2022-04-13 DIAGNOSIS — I4891 Unspecified atrial fibrillation: Secondary | ICD-10-CM | POA: Diagnosis not present

## 2022-04-13 DIAGNOSIS — I152 Hypertension secondary to endocrine disorders: Secondary | ICD-10-CM | POA: Diagnosis not present

## 2022-04-13 DIAGNOSIS — N186 End stage renal disease: Secondary | ICD-10-CM | POA: Diagnosis not present

## 2022-04-13 DIAGNOSIS — E1159 Type 2 diabetes mellitus with other circulatory complications: Secondary | ICD-10-CM | POA: Diagnosis not present

## 2022-04-13 DIAGNOSIS — D631 Anemia in chronic kidney disease: Secondary | ICD-10-CM | POA: Diagnosis not present

## 2022-04-13 NOTE — Telephone Encounter (Signed)
Ramiro Harvest called for pt, with pt present while at HD to schedule a f/u for pt. She is c/o worsening BLE pain that is mostly when walking and mild swelling. This has been ongoing for over a year but she feels it is worsening. Pt has been scheduled ABI with APP appt. And is aware of the appt date/time.  ?

## 2022-04-15 DIAGNOSIS — N186 End stage renal disease: Secondary | ICD-10-CM | POA: Diagnosis not present

## 2022-04-15 DIAGNOSIS — D509 Iron deficiency anemia, unspecified: Secondary | ICD-10-CM | POA: Diagnosis not present

## 2022-04-15 DIAGNOSIS — D631 Anemia in chronic kidney disease: Secondary | ICD-10-CM | POA: Diagnosis not present

## 2022-04-15 DIAGNOSIS — Z992 Dependence on renal dialysis: Secondary | ICD-10-CM | POA: Diagnosis not present

## 2022-04-15 DIAGNOSIS — N2581 Secondary hyperparathyroidism of renal origin: Secondary | ICD-10-CM | POA: Diagnosis not present

## 2022-04-15 DIAGNOSIS — N250 Renal osteodystrophy: Secondary | ICD-10-CM | POA: Diagnosis not present

## 2022-04-17 ENCOUNTER — Encounter (HOSPITAL_COMMUNITY): Payer: Self-pay | Admitting: Internal Medicine

## 2022-04-17 DIAGNOSIS — E1122 Type 2 diabetes mellitus with diabetic chronic kidney disease: Secondary | ICD-10-CM | POA: Diagnosis not present

## 2022-04-17 DIAGNOSIS — I4891 Unspecified atrial fibrillation: Secondary | ICD-10-CM | POA: Diagnosis not present

## 2022-04-17 DIAGNOSIS — Z48812 Encounter for surgical aftercare following surgery on the circulatory system: Secondary | ICD-10-CM | POA: Diagnosis not present

## 2022-04-17 DIAGNOSIS — E1159 Type 2 diabetes mellitus with other circulatory complications: Secondary | ICD-10-CM | POA: Diagnosis not present

## 2022-04-17 DIAGNOSIS — I5033 Acute on chronic diastolic (congestive) heart failure: Secondary | ICD-10-CM | POA: Diagnosis not present

## 2022-04-17 DIAGNOSIS — I152 Hypertension secondary to endocrine disorders: Secondary | ICD-10-CM | POA: Diagnosis not present

## 2022-04-17 NOTE — Progress Notes (Signed)
Attempted to obtain medical history via telephone, unable to reach at this time. I left a voicemail to return pre surgical testing department's phone call.  

## 2022-04-18 DIAGNOSIS — E785 Hyperlipidemia, unspecified: Secondary | ICD-10-CM | POA: Diagnosis not present

## 2022-04-18 DIAGNOSIS — Z48812 Encounter for surgical aftercare following surgery on the circulatory system: Secondary | ICD-10-CM | POA: Diagnosis not present

## 2022-04-18 DIAGNOSIS — K219 Gastro-esophageal reflux disease without esophagitis: Secondary | ICD-10-CM | POA: Diagnosis not present

## 2022-04-18 DIAGNOSIS — N250 Renal osteodystrophy: Secondary | ICD-10-CM | POA: Diagnosis not present

## 2022-04-18 DIAGNOSIS — J449 Chronic obstructive pulmonary disease, unspecified: Secondary | ICD-10-CM | POA: Diagnosis not present

## 2022-04-18 DIAGNOSIS — N2581 Secondary hyperparathyroidism of renal origin: Secondary | ICD-10-CM | POA: Diagnosis not present

## 2022-04-18 DIAGNOSIS — E1169 Type 2 diabetes mellitus with other specified complication: Secondary | ICD-10-CM | POA: Diagnosis not present

## 2022-04-18 DIAGNOSIS — T82838D Hemorrhage of vascular prosthetic devices, implants and grafts, subsequent encounter: Secondary | ICD-10-CM | POA: Diagnosis not present

## 2022-04-18 DIAGNOSIS — I5033 Acute on chronic diastolic (congestive) heart failure: Secondary | ICD-10-CM | POA: Diagnosis not present

## 2022-04-18 DIAGNOSIS — I2510 Atherosclerotic heart disease of native coronary artery without angina pectoris: Secondary | ICD-10-CM | POA: Diagnosis not present

## 2022-04-18 DIAGNOSIS — K222 Esophageal obstruction: Secondary | ICD-10-CM | POA: Diagnosis not present

## 2022-04-18 DIAGNOSIS — I700 Atherosclerosis of aorta: Secondary | ICD-10-CM | POA: Diagnosis not present

## 2022-04-18 DIAGNOSIS — Z992 Dependence on renal dialysis: Secondary | ICD-10-CM | POA: Diagnosis not present

## 2022-04-18 DIAGNOSIS — F419 Anxiety disorder, unspecified: Secondary | ICD-10-CM | POA: Diagnosis not present

## 2022-04-18 DIAGNOSIS — I252 Old myocardial infarction: Secondary | ICD-10-CM | POA: Diagnosis not present

## 2022-04-18 DIAGNOSIS — E1151 Type 2 diabetes mellitus with diabetic peripheral angiopathy without gangrene: Secondary | ICD-10-CM | POA: Diagnosis not present

## 2022-04-18 DIAGNOSIS — E1140 Type 2 diabetes mellitus with diabetic neuropathy, unspecified: Secondary | ICD-10-CM | POA: Diagnosis not present

## 2022-04-18 DIAGNOSIS — D631 Anemia in chronic kidney disease: Secondary | ICD-10-CM | POA: Diagnosis not present

## 2022-04-18 DIAGNOSIS — G8929 Other chronic pain: Secondary | ICD-10-CM | POA: Diagnosis not present

## 2022-04-18 DIAGNOSIS — I152 Hypertension secondary to endocrine disorders: Secondary | ICD-10-CM | POA: Diagnosis not present

## 2022-04-18 DIAGNOSIS — K5790 Diverticulosis of intestine, part unspecified, without perforation or abscess without bleeding: Secondary | ICD-10-CM | POA: Diagnosis not present

## 2022-04-18 DIAGNOSIS — E1122 Type 2 diabetes mellitus with diabetic chronic kidney disease: Secondary | ICD-10-CM | POA: Diagnosis not present

## 2022-04-18 DIAGNOSIS — E1159 Type 2 diabetes mellitus with other circulatory complications: Secondary | ICD-10-CM | POA: Diagnosis not present

## 2022-04-18 DIAGNOSIS — D509 Iron deficiency anemia, unspecified: Secondary | ICD-10-CM | POA: Diagnosis not present

## 2022-04-18 DIAGNOSIS — I272 Pulmonary hypertension, unspecified: Secondary | ICD-10-CM | POA: Diagnosis not present

## 2022-04-18 DIAGNOSIS — F32A Depression, unspecified: Secondary | ICD-10-CM | POA: Diagnosis not present

## 2022-04-18 DIAGNOSIS — N186 End stage renal disease: Secondary | ICD-10-CM | POA: Diagnosis not present

## 2022-04-18 DIAGNOSIS — E559 Vitamin D deficiency, unspecified: Secondary | ICD-10-CM | POA: Diagnosis not present

## 2022-04-18 DIAGNOSIS — I480 Paroxysmal atrial fibrillation: Secondary | ICD-10-CM | POA: Diagnosis not present

## 2022-04-19 DIAGNOSIS — I4891 Unspecified atrial fibrillation: Secondary | ICD-10-CM | POA: Diagnosis not present

## 2022-04-19 DIAGNOSIS — I5032 Chronic diastolic (congestive) heart failure: Secondary | ICD-10-CM | POA: Diagnosis not present

## 2022-04-19 DIAGNOSIS — I214 Non-ST elevation (NSTEMI) myocardial infarction: Secondary | ICD-10-CM | POA: Diagnosis not present

## 2022-04-19 DIAGNOSIS — I5033 Acute on chronic diastolic (congestive) heart failure: Secondary | ICD-10-CM | POA: Diagnosis not present

## 2022-04-19 DIAGNOSIS — E1159 Type 2 diabetes mellitus with other circulatory complications: Secondary | ICD-10-CM | POA: Diagnosis not present

## 2022-04-19 DIAGNOSIS — Z48812 Encounter for surgical aftercare following surgery on the circulatory system: Secondary | ICD-10-CM | POA: Diagnosis not present

## 2022-04-19 DIAGNOSIS — I2510 Atherosclerotic heart disease of native coronary artery without angina pectoris: Secondary | ICD-10-CM | POA: Diagnosis not present

## 2022-04-19 DIAGNOSIS — I152 Hypertension secondary to endocrine disorders: Secondary | ICD-10-CM | POA: Diagnosis not present

## 2022-04-19 DIAGNOSIS — I480 Paroxysmal atrial fibrillation: Secondary | ICD-10-CM | POA: Diagnosis not present

## 2022-04-20 DIAGNOSIS — N186 End stage renal disease: Secondary | ICD-10-CM | POA: Diagnosis not present

## 2022-04-20 DIAGNOSIS — D509 Iron deficiency anemia, unspecified: Secondary | ICD-10-CM | POA: Diagnosis not present

## 2022-04-20 DIAGNOSIS — E1159 Type 2 diabetes mellitus with other circulatory complications: Secondary | ICD-10-CM | POA: Diagnosis not present

## 2022-04-20 DIAGNOSIS — Z48812 Encounter for surgical aftercare following surgery on the circulatory system: Secondary | ICD-10-CM | POA: Diagnosis not present

## 2022-04-20 DIAGNOSIS — N2581 Secondary hyperparathyroidism of renal origin: Secondary | ICD-10-CM | POA: Diagnosis not present

## 2022-04-20 DIAGNOSIS — D631 Anemia in chronic kidney disease: Secondary | ICD-10-CM | POA: Diagnosis not present

## 2022-04-20 DIAGNOSIS — I5033 Acute on chronic diastolic (congestive) heart failure: Secondary | ICD-10-CM | POA: Diagnosis not present

## 2022-04-20 DIAGNOSIS — I152 Hypertension secondary to endocrine disorders: Secondary | ICD-10-CM | POA: Diagnosis not present

## 2022-04-20 DIAGNOSIS — I2510 Atherosclerotic heart disease of native coronary artery without angina pectoris: Secondary | ICD-10-CM | POA: Diagnosis not present

## 2022-04-20 DIAGNOSIS — Z992 Dependence on renal dialysis: Secondary | ICD-10-CM | POA: Diagnosis not present

## 2022-04-20 DIAGNOSIS — I480 Paroxysmal atrial fibrillation: Secondary | ICD-10-CM | POA: Diagnosis not present

## 2022-04-20 DIAGNOSIS — N250 Renal osteodystrophy: Secondary | ICD-10-CM | POA: Diagnosis not present

## 2022-04-20 LAB — BASIC METABOLIC PANEL
BUN/Creatinine Ratio: 3 — ABNORMAL LOW (ref 12–28)
BUN: 24 mg/dL (ref 8–27)
CO2: 28 mmol/L (ref 20–29)
Calcium: 9.6 mg/dL (ref 8.7–10.3)
Chloride: 93 mmol/L — ABNORMAL LOW (ref 96–106)
Creatinine, Ser: 7.65 mg/dL — ABNORMAL HIGH (ref 0.57–1.00)
Glucose: 143 mg/dL — ABNORMAL HIGH (ref 70–99)
Potassium: 3.5 mmol/L (ref 3.5–5.2)
Sodium: 137 mmol/L (ref 134–144)
eGFR: 5 mL/min/{1.73_m2} — ABNORMAL LOW (ref >59)

## 2022-04-20 LAB — CBC
Hematocrit: 31.9 % — ABNORMAL LOW (ref 34.0–46.6)
Hemoglobin: 10 g/dL — ABNORMAL LOW (ref 11.1–15.9)
MCH: 27.9 pg (ref 26.6–33.0)
MCHC: 31.3 g/dL — ABNORMAL LOW (ref 31.5–35.7)
MCV: 89 fL (ref 79–97)
Platelets: 300 10*3/uL (ref 150–450)
RBC: 3.58 x10E6/uL — ABNORMAL LOW (ref 3.77–5.28)
RDW: 15.4 % (ref 11.7–15.4)
WBC: 9.3 10*3/uL (ref 3.4–10.8)

## 2022-04-21 ENCOUNTER — Ambulatory Visit (INDEPENDENT_AMBULATORY_CARE_PROVIDER_SITE_OTHER): Payer: Medicare Other | Admitting: Pharmacist

## 2022-04-21 DIAGNOSIS — Z794 Long term (current) use of insulin: Secondary | ICD-10-CM

## 2022-04-21 DIAGNOSIS — J449 Chronic obstructive pulmonary disease, unspecified: Secondary | ICD-10-CM

## 2022-04-21 DIAGNOSIS — I4891 Unspecified atrial fibrillation: Secondary | ICD-10-CM

## 2022-04-22 DIAGNOSIS — N250 Renal osteodystrophy: Secondary | ICD-10-CM | POA: Diagnosis not present

## 2022-04-22 DIAGNOSIS — N2581 Secondary hyperparathyroidism of renal origin: Secondary | ICD-10-CM | POA: Diagnosis not present

## 2022-04-22 DIAGNOSIS — N186 End stage renal disease: Secondary | ICD-10-CM | POA: Diagnosis not present

## 2022-04-22 DIAGNOSIS — D509 Iron deficiency anemia, unspecified: Secondary | ICD-10-CM | POA: Diagnosis not present

## 2022-04-22 DIAGNOSIS — D631 Anemia in chronic kidney disease: Secondary | ICD-10-CM | POA: Diagnosis not present

## 2022-04-22 DIAGNOSIS — Z992 Dependence on renal dialysis: Secondary | ICD-10-CM | POA: Diagnosis not present

## 2022-04-24 ENCOUNTER — Encounter (HOSPITAL_COMMUNITY): Payer: Self-pay | Admitting: Internal Medicine

## 2022-04-24 ENCOUNTER — Ambulatory Visit (HOSPITAL_COMMUNITY): Payer: Medicare Other | Admitting: Certified Registered"

## 2022-04-24 ENCOUNTER — Ambulatory Visit (HOSPITAL_BASED_OUTPATIENT_CLINIC_OR_DEPARTMENT_OTHER)
Admission: RE | Admit: 2022-04-24 | Discharge: 2022-04-24 | Disposition: A | Discharge status: Home / Self Care | Payer: Medicare Other | Source: Home / Self Care | Attending: Internal Medicine | Admitting: Internal Medicine

## 2022-04-24 ENCOUNTER — Ambulatory Visit (HOSPITAL_BASED_OUTPATIENT_CLINIC_OR_DEPARTMENT_OTHER): Payer: Medicare Other | Admitting: Certified Registered"

## 2022-04-24 ENCOUNTER — Encounter (HOSPITAL_COMMUNITY)
Admission: RE | Disposition: A | Discharge status: Home / Self Care | Payer: Self-pay | Source: Home / Self Care | Attending: Internal Medicine

## 2022-04-24 DIAGNOSIS — I5032 Chronic diastolic (congestive) heart failure: Secondary | ICD-10-CM | POA: Insufficient documentation

## 2022-04-24 DIAGNOSIS — E669 Obesity, unspecified: Secondary | ICD-10-CM | POA: Diagnosis not present

## 2022-04-24 DIAGNOSIS — J189 Pneumonia, unspecified organism: Secondary | ICD-10-CM | POA: Diagnosis not present

## 2022-04-24 DIAGNOSIS — I25119 Atherosclerotic heart disease of native coronary artery with unspecified angina pectoris: Secondary | ICD-10-CM | POA: Insufficient documentation

## 2022-04-24 DIAGNOSIS — E1151 Type 2 diabetes mellitus with diabetic peripheral angiopathy without gangrene: Secondary | ICD-10-CM | POA: Diagnosis not present

## 2022-04-24 DIAGNOSIS — I132 Hypertensive heart and chronic kidney disease with heart failure and with stage 5 chronic kidney disease, or end stage renal disease: Secondary | ICD-10-CM | POA: Insufficient documentation

## 2022-04-24 DIAGNOSIS — Z79899 Other long term (current) drug therapy: Secondary | ICD-10-CM | POA: Insufficient documentation

## 2022-04-24 DIAGNOSIS — Z794 Long term (current) use of insulin: Secondary | ICD-10-CM

## 2022-04-24 DIAGNOSIS — J9601 Acute respiratory failure with hypoxia: Secondary | ICD-10-CM | POA: Diagnosis not present

## 2022-04-24 DIAGNOSIS — E1121 Type 2 diabetes mellitus with diabetic nephropathy: Secondary | ICD-10-CM | POA: Diagnosis not present

## 2022-04-24 DIAGNOSIS — N2581 Secondary hyperparathyroidism of renal origin: Secondary | ICD-10-CM | POA: Diagnosis not present

## 2022-04-24 DIAGNOSIS — N250 Renal osteodystrophy: Secondary | ICD-10-CM | POA: Diagnosis not present

## 2022-04-24 DIAGNOSIS — R0602 Shortness of breath: Secondary | ICD-10-CM | POA: Insufficient documentation

## 2022-04-24 DIAGNOSIS — R002 Palpitations: Secondary | ICD-10-CM | POA: Diagnosis not present

## 2022-04-24 DIAGNOSIS — Z955 Presence of coronary angioplasty implant and graft: Secondary | ICD-10-CM | POA: Insufficient documentation

## 2022-04-24 DIAGNOSIS — I214 Non-ST elevation (NSTEMI) myocardial infarction: Secondary | ICD-10-CM | POA: Diagnosis not present

## 2022-04-24 DIAGNOSIS — I120 Hypertensive chronic kidney disease with stage 5 chronic kidney disease or end stage renal disease: Secondary | ICD-10-CM | POA: Diagnosis not present

## 2022-04-24 DIAGNOSIS — J449 Chronic obstructive pulmonary disease, unspecified: Secondary | ICD-10-CM | POA: Insufficient documentation

## 2022-04-24 DIAGNOSIS — E876 Hypokalemia: Secondary | ICD-10-CM | POA: Diagnosis not present

## 2022-04-24 DIAGNOSIS — Z7901 Long term (current) use of anticoagulants: Secondary | ICD-10-CM | POA: Insufficient documentation

## 2022-04-24 DIAGNOSIS — Z6831 Body mass index (BMI) 31.0-31.9, adult: Secondary | ICD-10-CM | POA: Diagnosis not present

## 2022-04-24 DIAGNOSIS — R9431 Abnormal electrocardiogram [ECG] [EKG]: Secondary | ICD-10-CM | POA: Diagnosis not present

## 2022-04-24 DIAGNOSIS — I509 Heart failure, unspecified: Secondary | ICD-10-CM | POA: Diagnosis not present

## 2022-04-24 DIAGNOSIS — E1122 Type 2 diabetes mellitus with diabetic chronic kidney disease: Secondary | ICD-10-CM | POA: Insufficient documentation

## 2022-04-24 DIAGNOSIS — E1165 Type 2 diabetes mellitus with hyperglycemia: Secondary | ICD-10-CM | POA: Diagnosis not present

## 2022-04-24 DIAGNOSIS — I10 Essential (primary) hypertension: Secondary | ICD-10-CM | POA: Insufficient documentation

## 2022-04-24 DIAGNOSIS — I4891 Unspecified atrial fibrillation: Secondary | ICD-10-CM | POA: Diagnosis not present

## 2022-04-24 DIAGNOSIS — K219 Gastro-esophageal reflux disease without esophagitis: Secondary | ICD-10-CM | POA: Diagnosis not present

## 2022-04-24 DIAGNOSIS — N186 End stage renal disease: Secondary | ICD-10-CM | POA: Insufficient documentation

## 2022-04-24 DIAGNOSIS — E1140 Type 2 diabetes mellitus with diabetic neuropathy, unspecified: Secondary | ICD-10-CM | POA: Insufficient documentation

## 2022-04-24 DIAGNOSIS — R6520 Severe sepsis without septic shock: Secondary | ICD-10-CM | POA: Diagnosis not present

## 2022-04-24 DIAGNOSIS — Z20822 Contact with and (suspected) exposure to covid-19: Secondary | ICD-10-CM | POA: Diagnosis not present

## 2022-04-24 DIAGNOSIS — D509 Iron deficiency anemia, unspecified: Secondary | ICD-10-CM | POA: Diagnosis not present

## 2022-04-24 DIAGNOSIS — Z992 Dependence on renal dialysis: Secondary | ICD-10-CM | POA: Insufficient documentation

## 2022-04-24 DIAGNOSIS — Z87891 Personal history of nicotine dependence: Secondary | ICD-10-CM | POA: Insufficient documentation

## 2022-04-24 DIAGNOSIS — I252 Old myocardial infarction: Secondary | ICD-10-CM | POA: Diagnosis not present

## 2022-04-24 DIAGNOSIS — R778 Other specified abnormalities of plasma proteins: Secondary | ICD-10-CM | POA: Diagnosis not present

## 2022-04-24 DIAGNOSIS — I2510 Atherosclerotic heart disease of native coronary artery without angina pectoris: Secondary | ICD-10-CM | POA: Diagnosis present

## 2022-04-24 DIAGNOSIS — D631 Anemia in chronic kidney disease: Secondary | ICD-10-CM | POA: Diagnosis not present

## 2022-04-24 DIAGNOSIS — Z8673 Personal history of transient ischemic attack (TIA), and cerebral infarction without residual deficits: Secondary | ICD-10-CM | POA: Insufficient documentation

## 2022-04-24 DIAGNOSIS — I5033 Acute on chronic diastolic (congestive) heart failure: Secondary | ICD-10-CM | POA: Diagnosis not present

## 2022-04-24 DIAGNOSIS — I480 Paroxysmal atrial fibrillation: Secondary | ICD-10-CM | POA: Diagnosis not present

## 2022-04-24 DIAGNOSIS — E782 Mixed hyperlipidemia: Secondary | ICD-10-CM | POA: Diagnosis not present

## 2022-04-24 DIAGNOSIS — A419 Sepsis, unspecified organism: Secondary | ICD-10-CM | POA: Diagnosis not present

## 2022-04-24 DIAGNOSIS — I110 Hypertensive heart disease with heart failure: Secondary | ICD-10-CM | POA: Diagnosis not present

## 2022-04-24 HISTORY — PX: CARDIOVERSION: SHX1299

## 2022-04-24 LAB — GLUCOSE, CAPILLARY: Glucose-Capillary: 154 mg/dL — ABNORMAL HIGH (ref 70–99)

## 2022-04-24 SURGERY — CARDIOVERSION
Anesthesia: General

## 2022-04-24 MED ORDER — SODIUM CHLORIDE 0.9 % IV SOLN: INTRAVENOUS | Status: DC | PRN

## 2022-04-24 MED ORDER — LIDOCAINE HCL (CARDIAC) PF 100 MG/5ML IV SOSY
PREFILLED_SYRINGE | INTRAVENOUS | Status: DC | PRN
Start: 2022-04-24 — End: 2022-04-24
  Administered 2022-04-24: 60 mg via INTRAVENOUS

## 2022-04-24 MED ORDER — PROPOFOL 10 MG/ML IV BOLUS
INTRAVENOUS | Status: DC | PRN
  Administered 2022-04-24: 50 mg via INTRAVENOUS

## 2022-04-24 NOTE — Anesthesia Postprocedure Evaluation (Signed)
Anesthesia Post Note  Patient: [name]  Procedure(s) Performed: CARDIOVERSION     Patient location during evaluation: PACU Anesthesia Type: General Level of consciousness: awake Pain management: pain level controlled Vital Signs Assessment: post-procedure vital signs reviewed and stable Respiratory status: spontaneous breathing and respiratory function stable Cardiovascular status: stable Postop Assessment: no apparent nausea or vomiting Anesthetic complications: no   No notable events documented.  Last Vitals:  Vitals:   04/24/22 1005 04/24/22 1010  BP: (!) 144/75 (!) 163/63  Pulse: 95 91  Resp: 18 15  Temp: 37.1 C   SpO2: 100% 96%    Last Pain:  Vitals:   04/24/22 1005  TempSrc: Oral  PainSc: 0-No pain                 Merlinda Frederick

## 2022-04-24 NOTE — CV Procedure (Signed)
CARDIOVERSION  INdication:   Atrial fibrillation  Patient sedated by anesthesia with propofol and lidocaine  WIth pads in the AP position, patient cardioverted to SR with 200 J synchronized biphasic energy.  Procedure without complication  12 lead EKG pending   Dorris Carnes MD

## 2022-04-24 NOTE — Anesthesia Preprocedure Evaluation (Signed)
Anesthesia Evaluation  Patient identified by MRN, date of birth, ID band Patient awake    Reviewed: Allergy & Precautions, NPO status , Patient's Chart, lab work & pertinent test results  Airway Mallampati: II  TM Distance: >3 FB Neck ROM: Full    Dental  (+) Upper Dentures   Pulmonary shortness of breath, COPD, former smoker,    Pulmonary exam normal breath sounds clear to auscultation       Cardiovascular Exercise Tolerance: Poor hypertension, + angina + CAD, + Past MI, + Peripheral Vascular Disease and +CHF   Rhythm:Irregular Rate:Abnormal     Neuro/Psych Anxiety  Neuromuscular disease (diabetic neuropathy) CVA negative psych ROS   GI/Hepatic Neg liver ROS, GERD  ,  Endo/Other  diabetes, Type 2, Insulin Dependent  Renal/GU DialysisRenal disease  negative genitourinary   Musculoskeletal  (+) Arthritis , Osteoarthritis,    Abdominal   Peds negative pediatric ROS (+)  Hematology  (+) Blood dyscrasia, anemia ,   Anesthesia Other Findings   Reproductive/Obstetrics negative OB ROS                             Anesthesia Physical Anesthesia Plan  ASA: 3  Anesthesia Plan: General   Post-op Pain Management:    Induction: Intravenous  PONV Risk Score and Plan: 3 and Propofol infusion and Treatment may vary due to age or medical condition  Airway Management Planned: Mask  Additional Equipment: None  Intra-op Plan:   Post-operative Plan: Extubation in OR  Informed Consent: I have reviewed the patients History and Physical, chart, labs and discussed the procedure including the risks, benefits and alternatives for the proposed anesthesia with the patient or authorized representative who has indicated his/her understanding and acceptance.     Dental advisory given  Plan Discussed with: CRNA, Anesthesiologist and Surgeon  Anesthesia Plan Comments:         Anesthesia Quick  Evaluation

## 2022-04-24 NOTE — Transfer of Care (Signed)
Immediate Anesthesia Transfer of Care Note  Patient: [name]  Procedure(s) Performed: CARDIOVERSION  Patient Location: PACU  Anesthesia Type:General  Level of Consciousness: drowsy and patient cooperative  Airway & Oxygen Therapy: Patient Spontanous Breathing  Post-op Assessment: Report given to RN and Post -op Vital signs reviewed and stable  Post vital signs: Reviewed and stable  Last Vitals:  Vitals Value Taken Time  BP    Temp    Pulse 95 04/24/22 1005  Resp 18 04/24/22 1005  SpO2 100 % 04/24/22 1005  Vitals shown include unvalidated device data.  Last Pain:  Vitals:   04/24/22 0909  TempSrc: Temporal  PainSc: 0-No pain         Complications: No notable events documented.

## 2022-04-24 NOTE — Interval H&P Note (Signed)
History and Physical Interval Note:  04/24/2022 9:50 AM  [name]  has presented today for surgery, with the diagnosis of atrial fibrillation.  The various methods of treatment have been discussed with the patient and family. After consideration of risks, benefits and other options for treatment, the patient has consented to  Procedure(s): CARDIOVERSION (N/A) as a surgical intervention.  [name] history has been reviewed, patient examined, no change in status, stable for surgery.  I have reviewed the patient's chart and labs.  Questions were answered to the patient's satisfaction.     Dorris Carnes

## 2022-04-25 ENCOUNTER — Emergency Department (HOSPITAL_COMMUNITY): Payer: Medicare Other

## 2022-04-25 ENCOUNTER — Encounter (HOSPITAL_COMMUNITY): Payer: Self-pay | Admitting: Emergency Medicine

## 2022-04-25 ENCOUNTER — Other Ambulatory Visit: Payer: Self-pay

## 2022-04-25 ENCOUNTER — Inpatient Hospital Stay (HOSPITAL_COMMUNITY)
Admission: EM | Admit: 2022-04-25 | Discharge: 2022-04-28 | DRG: 871 | Disposition: A | Discharge status: Home Health | Payer: Medicare Other | Attending: Internal Medicine | Admitting: Internal Medicine

## 2022-04-25 DIAGNOSIS — E782 Mixed hyperlipidemia: Secondary | ICD-10-CM | POA: Diagnosis present

## 2022-04-25 DIAGNOSIS — Z955 Presence of coronary angioplasty implant and graft: Secondary | ICD-10-CM

## 2022-04-25 DIAGNOSIS — E876 Hypokalemia: Secondary | ICD-10-CM | POA: Diagnosis present

## 2022-04-25 DIAGNOSIS — Z992 Dependence on renal dialysis: Secondary | ICD-10-CM | POA: Diagnosis not present

## 2022-04-25 DIAGNOSIS — N186 End stage renal disease: Secondary | ICD-10-CM

## 2022-04-25 DIAGNOSIS — I214 Non-ST elevation (NSTEMI) myocardial infarction: Secondary | ICD-10-CM

## 2022-04-25 DIAGNOSIS — E66811 Obesity, class 1: Secondary | ICD-10-CM | POA: Diagnosis present

## 2022-04-25 DIAGNOSIS — I10 Essential (primary) hypertension: Secondary | ICD-10-CM | POA: Diagnosis present

## 2022-04-25 DIAGNOSIS — N250 Renal osteodystrophy: Secondary | ICD-10-CM | POA: Diagnosis not present

## 2022-04-25 DIAGNOSIS — Z20822 Contact with and (suspected) exposure to covid-19: Secondary | ICD-10-CM | POA: Diagnosis present

## 2022-04-25 DIAGNOSIS — A419 Sepsis, unspecified organism: Secondary | ICD-10-CM | POA: Diagnosis present

## 2022-04-25 DIAGNOSIS — J189 Pneumonia, unspecified organism: Secondary | ICD-10-CM | POA: Diagnosis present

## 2022-04-25 DIAGNOSIS — R0600 Dyspnea, unspecified: Secondary | ICD-10-CM

## 2022-04-25 DIAGNOSIS — I5033 Acute on chronic diastolic (congestive) heart failure: Secondary | ICD-10-CM | POA: Diagnosis present

## 2022-04-25 DIAGNOSIS — J9601 Acute respiratory failure with hypoxia: Secondary | ICD-10-CM | POA: Diagnosis present

## 2022-04-25 DIAGNOSIS — R6520 Severe sepsis without septic shock: Secondary | ICD-10-CM | POA: Diagnosis present

## 2022-04-25 DIAGNOSIS — N2581 Secondary hyperparathyroidism of renal origin: Secondary | ICD-10-CM | POA: Diagnosis not present

## 2022-04-25 DIAGNOSIS — I509 Heart failure, unspecified: Secondary | ICD-10-CM | POA: Diagnosis not present

## 2022-04-25 DIAGNOSIS — E1122 Type 2 diabetes mellitus with diabetic chronic kidney disease: Secondary | ICD-10-CM | POA: Diagnosis present

## 2022-04-25 DIAGNOSIS — R9431 Abnormal electrocardiogram [ECG] [EKG]: Secondary | ICD-10-CM | POA: Diagnosis present

## 2022-04-25 DIAGNOSIS — J9621 Acute and chronic respiratory failure with hypoxia: Secondary | ICD-10-CM

## 2022-04-25 DIAGNOSIS — K219 Gastro-esophageal reflux disease without esophagitis: Secondary | ICD-10-CM | POA: Diagnosis present

## 2022-04-25 DIAGNOSIS — D631 Anemia in chronic kidney disease: Secondary | ICD-10-CM | POA: Diagnosis not present

## 2022-04-25 DIAGNOSIS — E669 Obesity, unspecified: Secondary | ICD-10-CM | POA: Diagnosis present

## 2022-04-25 DIAGNOSIS — I252 Old myocardial infarction: Secondary | ICD-10-CM

## 2022-04-25 DIAGNOSIS — E1165 Type 2 diabetes mellitus with hyperglycemia: Secondary | ICD-10-CM | POA: Diagnosis not present

## 2022-04-25 DIAGNOSIS — I2510 Atherosclerotic heart disease of native coronary artery without angina pectoris: Secondary | ICD-10-CM | POA: Diagnosis present

## 2022-04-25 DIAGNOSIS — I132 Hypertensive heart and chronic kidney disease with heart failure and with stage 5 chronic kidney disease, or end stage renal disease: Secondary | ICD-10-CM | POA: Diagnosis present

## 2022-04-25 DIAGNOSIS — E1121 Type 2 diabetes mellitus with diabetic nephropathy: Secondary | ICD-10-CM | POA: Diagnosis not present

## 2022-04-25 DIAGNOSIS — I480 Paroxysmal atrial fibrillation: Secondary | ICD-10-CM | POA: Diagnosis present

## 2022-04-25 DIAGNOSIS — J188 Other pneumonia, unspecified organism: Secondary | ICD-10-CM | POA: Diagnosis present

## 2022-04-25 DIAGNOSIS — Z6831 Body mass index (BMI) 31.0-31.9, adult: Secondary | ICD-10-CM | POA: Diagnosis not present

## 2022-04-25 DIAGNOSIS — I120 Hypertensive chronic kidney disease with stage 5 chronic kidney disease or end stage renal disease: Secondary | ICD-10-CM | POA: Diagnosis not present

## 2022-04-25 DIAGNOSIS — R002 Palpitations: Secondary | ICD-10-CM | POA: Diagnosis not present

## 2022-04-25 DIAGNOSIS — R778 Other specified abnormalities of plasma proteins: Secondary | ICD-10-CM | POA: Diagnosis not present

## 2022-04-25 DIAGNOSIS — R0602 Shortness of breath: Secondary | ICD-10-CM | POA: Diagnosis not present

## 2022-04-25 DIAGNOSIS — D509 Iron deficiency anemia, unspecified: Secondary | ICD-10-CM | POA: Diagnosis not present

## 2022-04-25 DIAGNOSIS — J449 Chronic obstructive pulmonary disease, unspecified: Secondary | ICD-10-CM | POA: Diagnosis present

## 2022-04-25 DIAGNOSIS — E1169 Type 2 diabetes mellitus with other specified complication: Secondary | ICD-10-CM | POA: Diagnosis present

## 2022-04-25 LAB — URINALYSIS, ROUTINE W REFLEX MICROSCOPIC
Bilirubin Urine: NEGATIVE
Glucose, UA: 50 mg/dL — AB
Ketones, ur: NEGATIVE mg/dL
Nitrite: NEGATIVE
Protein, ur: 100 mg/dL — AB
RBC / HPF: >50 RBC/hpf — ABNORMAL HIGH (ref 0–5)
Specific Gravity, Urine: 1.011 (ref 1.005–1.030)
pH: 7 (ref 5.0–8.0)

## 2022-04-25 LAB — RESP PANEL BY RT-PCR (FLU A&B, COVID) ARPGX2
Influenza A by PCR: NEGATIVE
Influenza B by PCR: NEGATIVE
SARS Coronavirus 2 by RT PCR: NEGATIVE

## 2022-04-25 LAB — CBC
HCT: 34.7 % — ABNORMAL LOW (ref 36.0–46.0)
Hemoglobin: 11 g/dL — ABNORMAL LOW (ref 12.0–15.0)
MCH: 27.8 pg (ref 26.0–34.0)
MCHC: 31.7 g/dL (ref 30.0–36.0)
MCV: 87.6 fL (ref 80.0–100.0)
Platelets: 342 10*3/uL (ref 150–400)
RBC: 3.96 MIL/uL (ref 3.87–5.11)
RDW: 15.9 % — ABNORMAL HIGH (ref 11.5–15.5)
WBC: 13.5 10*3/uL — ABNORMAL HIGH (ref 4.0–10.5)
nRBC: 0 % (ref 0.0–0.2)

## 2022-04-25 LAB — BASIC METABOLIC PANEL
Anion gap: 11 (ref 5–15)
BUN: 13 mg/dL (ref 8–23)
CO2: 31 mmol/L (ref 22–32)
Calcium: 8.6 mg/dL — ABNORMAL LOW (ref 8.9–10.3)
Chloride: 93 mmol/L — ABNORMAL LOW (ref 98–111)
Creatinine, Ser: 5.37 mg/dL — ABNORMAL HIGH (ref 0.44–1.00)
GFR, Estimated: 8 mL/min — ABNORMAL LOW (ref >60)
Glucose, Bld: 199 mg/dL — ABNORMAL HIGH (ref 70–99)
Potassium: 2.8 mmol/L — ABNORMAL LOW (ref 3.5–5.1)
Sodium: 135 mmol/L (ref 135–145)

## 2022-04-25 LAB — TROPONIN I (HIGH SENSITIVITY)
Troponin I (High Sensitivity): 61 ng/L — ABNORMAL HIGH (ref <18)
Troponin I (High Sensitivity): 66 ng/L — ABNORMAL HIGH (ref <18)

## 2022-04-25 LAB — POTASSIUM: Potassium: 3.1 mmol/L — ABNORMAL LOW (ref 3.5–5.1)

## 2022-04-25 MED ORDER — FOSFOMYCIN TROMETHAMINE 3 G PO PACK
3.0000 g | PACK | Freq: Once | ORAL | Status: AC
  Administered 2022-04-25: 3 g via ORAL
  Filled 2022-04-25: qty 3

## 2022-04-25 MED ORDER — IOHEXOL 350 MG/ML SOLN
100.0000 mL | Freq: Once | INTRAVENOUS | Status: AC | PRN
  Administered 2022-04-25: 100 mL via INTRAVENOUS

## 2022-04-25 MED ORDER — APIXABAN 5 MG PO TABS
5.0000 mg | ORAL_TABLET | Freq: Once | ORAL | Status: AC
  Administered 2022-04-25: 5 mg via ORAL
  Filled 2022-04-25: qty 1

## 2022-04-25 MED ORDER — POTASSIUM CHLORIDE CRYS ER 20 MEQ PO TBCR
20.0000 meq | EXTENDED_RELEASE_TABLET | Freq: Once | ORAL | Status: AC
  Administered 2022-04-26: 20 meq via ORAL
  Filled 2022-04-25: qty 1

## 2022-04-25 NOTE — ED Notes (Signed)
ED Provider at bedside. 

## 2022-04-25 NOTE — ED Notes (Addendum)
Pt's RA sats at 98%, pt insists she needs "air", O2 applied via [place] at 1L/M

## 2022-04-25 NOTE — ED Notes (Signed)
Pt instructed for getting urine sample

## 2022-04-25 NOTE — ED Notes (Signed)
The lowest the pts O2 was 90 pt was very sob and complaints of not being able to breathe

## 2022-04-25 NOTE — ED Triage Notes (Signed)
Pt was cardioverted at [facility] on yesterday, got home and heart began racing  again with c/o of shortness of breath, went to dialysis this am, received her treatment and came to ED for evaluation of Afib.

## 2022-04-25 NOTE — H&P (Incomplete)
History and Physical    Patient: [name] YKD:[id number] DOB: 10-02-1951 DOA: 04/25/2022 DOS: the patient was seen and examined on 04/25/2022 PCP: [name], MD  Patient coming from: Home  Chief Complaint:  Chief Complaint  Patient presents with  . Atrial Fibrillation   HPI: [name] is a 71 y.o. female with medical history significant of hypertension, type 2 diabetes mellitus, ESRD on HD (TTS), paroxysmal A-fib, COPD, GERD, CAD s/p stent placement who presents to the emergency department due to worsening shortness of breath and palpitations that has been ongoing since yesterday.  Patient has a history of atrial fibrillation and she was cardioverted yesterday by Dr. Harrington Challenger after which she went back to sinus rhythm.  Patient complained of worsening shortness of breath in the evening, along with palpitation and temperature of 100.68F which self resolved this morning.  This was associated with nonradiating, nonreproducible midsternal chest tightness.  This morning, she went to dialysis and she was placed on oxygen during dialysis, however, she was asked to go to the ED after the dialysis for further work-up and management.  ED Course:  In the emergency department, she was tachypneic, tachycardic, febrile (100.30F).  BP was 160/61.  Work-up in the ED showed leukocytosis and normocytic anemia.  BMP showed hypokalemia, BUN to creatinine 13/1.37, CBG 199, troponin x2 - 61 > 66.  Urinalysis was unimpressive for UTI.  Influenza A, B, SARS coronavirus 2 was negative. Chest x-ray showed cardiomegaly without acute abnormality of the lungs CT angiography chest with contrast showed no evidence of significant pulmonary embolus, but showed patchy nodular infiltrates demonstrated throughout both lungs most likely representing multifocal pneumonia. She was initially started on fosfomycin due to presumed UTI, but after CT angiogram of chest showed multifocal pneumonia, IV Levaquin was indicated.   Hospitalist was asked to admit patient for further evaluation and management.  Review of Systems: Review of systems as noted in the HPI. All other systems reviewed and are negative.   Past Medical History:  Diagnosis Date  . Acute on chronic diastolic CHF (congestive heart failure) ([place]) 03/05/2015   Grade 2. EF 60-65%  . Anemia    hx of  . Anxiety   . Arthritis   . CAD (coronary artery disease)    s/p Stenting in 2005;  [place] 7/09: Vigorous LV function, 2-3+ MR, RI 40%, RI stent patent, proximal-mid RCA 40-50%;  [place] 7/09:  EF 55-65%, trivial MR;  Myoview 11/18/12: EF 66%, normal LV wall motion, mild to moderate anterior ischemia - reviewed by [facility] and felt to rep breast atten (low risk);  Echo 6/14: mild LVH, EF 55-65%, Gr 2 DD, PASP 44, trivial eff   . Chronic kidney disease    CREATININE IS UP--GOING TO KIDNEY MD   . Chronic neck pain   . COPD (chronic obstructive pulmonary disease) ([place])   . Degenerative joint disease    left shoulder  . Diabetic neuropathy ([place])   . Diverticulosis   . Esophageal stricture   . GERD (gastroesophageal reflux disease)   . Hyperlipidemia   . Hypertension   . IDDM (insulin dependent diabetes mellitus)   . Pericardial effusion 03/06/2015   Very small per echo ; pleural nodules seen on CT chest.  . Peripheral vascular disease ([place])   . Stroke [facility])    "mini- years ago"  . Vitamin D deficiency    Past Surgical History:  Procedure Laterality Date  . A/V FISTULAGRAM N/A 08/10/2017   Procedure: A/V Fistulagram - Right  Arm;  Surgeon: Elam Dutch, MD;  Location: [place] CV LAB;  Service: Cardiovascular;  Laterality: N/A;  . A/V FISTULAGRAM N/A 03/03/2020   Procedure: A/V FISTULAGRAM - Right Arm;  Surgeon: Marty Heck, MD;  Location: [place] CV LAB;  Service: Cardiovascular;  Laterality: N/A;  . ABDOMINAL HYSTERECTOMY    . AV FISTULA PLACEMENT Right 07/20/2015   Procedure: RADIAL CEPHALIC ARTERIOVENOUS FISTULA CREATION RIGHT ARM;   Surgeon: Angelia Mould, MD;  Location: [place];  Service: Vascular;  Laterality: Right;  . AV FISTULA PLACEMENT Right 11/26/2015   Procedure:  Creation of RIGHT BRACHIO-CEPHALIC arteriovenous fistula;  Surgeon: Angelia Mould, MD;  Location: [place];  Service: Vascular;  Laterality: Right;  . BACK SURGERY     2007 & 2013  . BLADDER SURGERY     Tack  . CARDIAC CATHETERIZATION  2003, 2005, 2009   1 stent  . cardiac stents    . CARDIOVERSION N/A 04/24/2022   Procedure: CARDIOVERSION;  Surgeon: Fay Records, MD;  Location: [place] ENDOSCOPY;  Service: Cardiovascular;  Laterality: N/A;  . CATARACT EXTRACTION W/PHACO Right 05/04/2014   Procedure: CATARACT EXTRACTION PHACO AND INTRAOCULAR LENS PLACEMENT ([place]);  Surgeon: Williams Che, MD;  [facility];  Service: Ophthalmology;  Laterality: Right;  CDE 1.47  . CATARACT EXTRACTION W/PHACO Left 07/20/2014   Procedure: CATARACT EXTRACTION WITH PHACO AND INTRAOCULAR LENS PLACEMENT LEFT EYE CDE=1.79;  Surgeon: Williams Che, MD;  Location: [facility];  Service: Ophthalmology;  Laterality: Left;  . CERVICAL SPINE SURGERY  2012   8 screws  . CORONARY ATHERECTOMY N/A 03/08/2022   Procedure: CORONARY ATHERECTOMY;  Surgeon: Sherren Mocha, MD;  Location: [place] CV LAB;  Service: Cardiovascular;  Laterality: N/A;  . CYSTOSCOPY WITH INJECTION N/A 06/03/2013   Procedure: MACROPLASTIQUE  INJECTION ;  Surgeon: Malka So, MD;  [facility];  Service: Urology;  Laterality: N/A;  . FISTULOGRAM Right 10/20/2016   Procedure: FISTULOGRAM WITH POSSIBLE INTERVENTION;  Surgeon: [name], MD;  [facility]: [facility];  Service: Vascular;  Laterality: Right;  . INSERTION OF DIALYSIS CATHETER N/A 11/26/2015   Procedure: INSERTION OF DIALYSIS CATHETER;  Surgeon: Angelia Mould, MD;  Location: [place];  Service: Vascular;  Laterality: N/A;  . INTRAVASCULAR IMAGING/OCT N/A 03/08/2022   Procedure: INTRAVASCULAR IMAGING/OCT;  Surgeon: Sherren Mocha, MD;   Location: [place] CV LAB;  Service: Cardiovascular;  Laterality: N/A;  . LAMINECTOMY  12/29/2011  . LEFT HEART CATH AND CORONARY ANGIOGRAPHY N/A 03/06/2022   Procedure: LEFT HEART CATH AND CORONARY ANGIOGRAPHY;  Surgeon: Nelva Bush, MD;  Location: [place] [facility];  Service: Cardiovascular;  Laterality: N/A;  . LUMBAR LAMINECTOMY/DECOMPRESSION MICRODISCECTOMY  12/29/2011   Procedure: LUMBAR LAMINECTOMY/DECOMPRESSION MICRODISCECTOMY;  Surgeon: Floyce Stakes, MD;  Location: [place] NEURO ORS;  Service: Neurosurgery;  Laterality: N/A;  Lumbar Two through Lumbar Five Laminectomies/Cellsaver  . PARTIAL HYSTERECTOMY    . PERIPHERAL VASCULAR BALLOON ANGIOPLASTY  03/03/2020   Procedure: PERIPHERAL VASCULAR BALLOON ANGIOPLASTY;  Surgeon: Marty Heck, MD;  Location: [place] CV LAB;  Service: Cardiovascular;;  Rt arm fistula  . PERIPHERAL VASCULAR BALLOON ANGIOPLASTY Right 04/15/2021   Procedure: PERIPHERAL VASCULAR BALLOON ANGIOPLASTY;  Surgeon: Angelia Mould, MD;  Location: [place] CV LAB;  Service: Cardiovascular;  Laterality: Right;  arm fistula  . PERIPHERAL VASCULAR BALLOON ANGIOPLASTY  03/14/2022   Procedure: PERIPHERAL VASCULAR BALLOON ANGIOPLASTY;  Surgeon: Broadus John, MD;  Location: [place] CV LAB;  Service: Cardiovascular;;  right arm AVF  . PERIPHERAL VASCULAR CATHETERIZATION N/A 11/22/2015   Procedure: Fistulagram;  Surgeon: Angelia Mould, MD;  Location: [place] CV LAB;  Service: Cardiovascular;  Laterality: N/A;  . PERIPHERAL VASCULAR INTERVENTION  08/10/2017   Procedure: PERIPHERAL VASCULAR INTERVENTION;  Surgeon: Elam Dutch, MD;  Location: [place] CV LAB;  Service: Cardiovascular;;  . SHOULDER SURGERY Right    Rotator cuff    Social History:  reports that she quit smoking about 20 years ago. Her smoking use included cigarettes. She has a 30.00 pack-year smoking history. She has never used smokeless tobacco. She reports that she  does not drink alcohol and does not use drugs.   Allergies  Allergen Reactions  . Azithromycin     Prolonged QT  . Ace Inhibitors Cough  . Clopidogrel Bisulfate Other (See Comments)    Sick, Headache, "Felt terrible"  . Codeine Other (See Comments)    hallucinations  . Lisinopril Cough  . Penicillins Other (See Comments)    Unknown childhood reaction Did it involve swelling of the face/tongue/throat, SOB, or low BP? Unknown Did it involve sudden or severe rash/hives, skin peeling, or any reaction on the inside of your mouth or nose? Unknown Did you need to seek medical attention at a hospital or doctor's office? Unknown When did it last happen?      childhood allergy If all above answers are "NO", may proceed with cephalosporin use.      Family History  Problem Relation Age of Onset  . Heart disease Other   . Hypertension Father   . Cancer Father   . Hypertension Mother   . Hypertension Daughter   . Pancreatitis Brother   . Hypertension Daughter   . Diabetes Daughter     ***  Prior to Admission medications   Medication Sig Start Date End Date Taking? Authorizing Provider  acetaminophen (TYLENOL) 500 MG tablet Take 1,000 mg by mouth every 6 (six) hours as needed for moderate pain or headache.   Yes [provider]  albuterol (PROVENTIL) (2.5 MG/3ML) 0.083% nebulizer solution Take 3 mLs (2.5 mg total) by nebulization every 6 (six) hours as needed for wheezing or shortness of breath. 12/02/21  Yes [name], MD  albuterol (VENTOLIN HFA) 108 (90 Base) MCG/ACT inhaler Inhale 2 puffs into the lungs every 6 (six) hours as needed for wheezing or shortness of breath. 12/02/21  Yes [name], MD  apixaban (ELIQUIS) 5 MG TABS tablet Take 1 tablet (5 mg total) by mouth 2 (two) times daily. 04/05/22  Yes Minus Breeding, MD  atenolol (TENORMIN) 100 MG tablet Take 1 tablet (100 mg total) by mouth at bedtime. 03/31/22 04/30/22 Yes Phillips Grout, MD  budesonide-formoterol  [facility]) 160-4.5 MCG/ACT inhaler Inhale 2 puffs into the lungs 2 (two) times daily. 11/15/21  Yes [name], MD  calcium acetate (PHOSLO) 667 MG capsule Take 667-1,334 mg by mouth See admin instructions. Take 1334 mg with meals 3 times daily and 667 mg with snacks   Yes [provider]  Carboxymethylcellul-Glycerin (LUBRICATING EYE DROPS OP) Place 1 drop into both eyes daily as needed (dry eyes).   Yes [provider]  cetirizine (ZYRTEC) 10 MG tablet Take 1 tablet (10 mg total) by mouth daily as needed for allergies. Patient taking differently: Take 10 mg by mouth daily. 02/08/22  Yes [name], MD  diltiazem (CARDIZEM CD) 240 MG 24 hr capsule Take 1 capsule (240 mg total) by mouth daily. 04/12/22  05/12/22 Yes [name], MD  gabapentin (NEURONTIN) 400 MG capsule Take 2 capsules (800 mg total) by mouth See admin instructions. Take it after HD Patient taking differently: Take 800 mg by mouth See admin instructions. Take after dialysis 03/22/22  Yes [name], MD  hydrOXYzine (ATARAX) 50 MG tablet Take 1 tablet (50 mg total) by mouth 3 (three) times daily as needed for anxiety. 03/22/22  Yes [name], [name], MD  Insulin Glargine [facility] KWIKPEN) 100 UNIT/ML Inject 25 Units into the skin at bedtime. Add five units each time the fasting is over 125 three days in a row. 11/15/21  Yes [name], MD  lidocaine-prilocaine (EMLA) cream Apply 1 application. topically as needed (dialysis).   Yes [provider]  nitroGLYCERIN (NITROSTAT) 0.4 MG SL tablet Place 1 tablet (0.4 mg total) under the tongue every 5 (five) minutes as needed for chest pain. 11/09/21  Yes Minus Breeding, MD  omeprazole (PRILOSEC) 20 MG capsule Take 1 capsule (20 mg total) by mouth daily. 12/12/21  Yes [name], [name], MD  ondansetron (ZOFRAN) 4 MG tablet TAKE 1 TABLET BY MOUTH EVERY 8 HOURS AS NEEDED FOR NAUSEA AND VOMITING 04/07/22  Yes [name], [name], MD  polyethylene glycol (MIRALAX /  GLYCOLAX) 17 g packet Take 17 g by mouth daily as needed for moderate constipation or mild constipation. 03/10/22  Yes Pokhrel, Laxman, MD  QUEtiapine (SEROQUEL) 200 MG tablet Take 1 tablet (200 mg total) by mouth at bedtime. For sleep and for anxiety 03/17/22  Yes [name], [name], MD  rOPINIRole (REQUIP) 1 MG tablet Take 1 tablet (1 mg total) by mouth at bedtime. For leg cramps 09/15/21  Yes [name], [name], MD  rosuvastatin (CRESTOR) 5 MG tablet Take 1 tablet (5 mg total) by mouth daily. For cholesterol Patient taking differently: Take 5 mg by mouth at bedtime. For cholesterol 12/12/21  Yes [name], [name], MD  Semaglutide,0.25 or 0.5MG /DOS, (OZEMPIC, 0.25 OR 0.5 MG/DOSE,) 2 MG/1.5ML SOPN Inject 0.5 mg into the skin every Friday.   Yes [provider]  ticagrelor (BRILINTA) 90 MG TABS tablet Take 1 tablet (90 mg total) by mouth 2 (two) times daily. 04/05/22 06/04/22 Yes Minus Breeding, MD  torsemide (DEMADEX) 20 MG tablet Take 40-60 mg by mouth daily. 01/19/22  Yes [provider]  Continuous Blood Gluc Receiver (FREESTYLE LIBRE 2 READER) DEVI USE TO TEST BLOOD SUGAR CONTINUOUSLY 11/15/21   [name], MD  Continuous Blood Gluc Sensor (FREESTYLE LIBRE 2 SENSOR) MISC Use multiple times daily to track glucose to prevent highs and lows as a complication of dialysis Dx:Z99.2, E11.59 09/15/21   [name], MD  famotidine (PEPCID) 20 MG tablet Take 1 tablet (20 mg total) by mouth 2 (two) times daily. Patient not taking: Reported on 04/25/2022 02/15/22   Dettinger, Fransisca Kaufmann, MD  glucose blood (ONETOUCH ULTRA) test strip Use to test blood sugar 3 times daily as directed. DX: E11.9 09/13/20   [name], MD    Physical Exam: BP (!) 176/82   Pulse (!) 104   Temp 98.6 F (37 C) (Oral)   Resp (!) 21   SpO2 100%   General: 71 y.o. year-old female well developed well nourished in no acute distress.  Alert and oriented x3. HEENT: NCAT, EOMI Neck: Supple, trachea medial Cardiovascular:  Tachycardia.  Regular rate and rhythm with no rubs or gallops.  No thyromegaly or JVD noted.  No lower extremity edema. 2/4 pulses in all 4 extremities. Respiratory: Tachypnea.  Decreased breath sounds on auscultation with  no wheezes or rales.  Abdomen: Soft, nontender nondistended with normal bowel sounds x4 quadrants. Muskuloskeletal: No cyanosis, clubbing or edema noted bilaterally Neuro: CN II-XII intact, strength 5/5 x 4, sensation, reflexes intact Skin: No ulcerative lesions noted or rashes Psychiatry: Judgement and insight appear normal. Mood is appropriate for condition and setting          Labs on Admission:  Basic Metabolic Panel: Recent Labs  Lab 04/19/22 1456 04/25/22 1345 04/25/22 2052  NA 137 135  --   K 3.5 2.8* 3.1*  CL 93* 93*  --   CO2 28 31  --   GLUCOSE 143* 199*  --   BUN 24 13  --   CREATININE 7.65* 5.37*  --   CALCIUM 9.6 8.6*  --    Liver Function Tests: No results for input(s): AST, ALT, ALKPHOS, BILITOT, PROT, ALBUMIN in the last 168 hours. No results for input(s): LIPASE, AMYLASE in the last 168 hours. No results for input(s): AMMONIA in the last 168 hours. CBC: Recent Labs  Lab 04/19/22 1456 04/25/22 1345  WBC 9.3 13.5*  HGB 10.0* 11.0*  HCT 31.9* 34.7*  MCV 89 87.6  PLT 300 342   Cardiac Enzymes: No results for input(s): CKTOTAL, CKMB, CKMBINDEX, TROPONINI in the last 168 hours.  BNP (last 3 results) Recent Labs    12/06/21 0137 02/28/22 2351 03/26/22 1407  BNP 2,593.0* 1,695.0* 2,276.0*    ProBNP (last 3 results) No results for input(s): PROBNP in the last 8760 hours.  CBG: Recent Labs  Lab 04/24/22 0918  GLUCAP 154*    Radiological Exams on Admission: DG Chest 2 View  Result Date: 04/25/2022 CLINICAL DATA:  AFib, palpitations, cardioversion yesterday EXAM: CHEST - 2 VIEW COMPARISON:  03/29/2022 FINDINGS: Cardiomegaly. Both lungs are clear. Disc degenerative disease of the thoracic spine. IMPRESSION: Cardiomegaly without  acute abnormality of the lungs. Electronically Signed   By: Delanna Ahmadi M.D.   On: 04/25/2022 14:39   CT Angio Chest PE W and/or Wo Contrast  Result Date: 04/25/2022 CLINICAL DATA:  Pulmonary embolus suspected with high probability. Cardioversion yesterday. Heart is racing with shortness of breath. Atrial fibrillation. EXAM: CT ANGIOGRAPHY CHEST WITH CONTRAST TECHNIQUE: Multidetector CT imaging of the chest was performed using the standard protocol during bolus administration of intravenous contrast. Multiplanar CT image reconstructions and MIPs were obtained to evaluate the vascular anatomy. RADIATION DOSE REDUCTION: This exam was performed according to the departmental dose-optimization program which includes automated exposure control, adjustment of the mA and/or kV according to patient size and/or use of iterative reconstruction technique. CONTRAST:  141mL OMNIPAQUE IOHEXOL 350 MG/ML SOLN COMPARISON:  02/02/2021 FINDINGS: Cardiovascular: Good opacification of the central and segmental pulmonary arteries. No focal filling defects. No evidence of significant pulmonary embolus. Diffuse cardiac enlargement. No pericardial effusions. Normal caliber thoracic aorta. Calcification of the aorta and coronary arteries. Mediastinum/Nodes: Esophagus is decompressed. Mediastinal lymph nodes are prominent, similar to prior study. Likely reactive. Thyroid gland is unremarkable. Lungs/Pleura: Patchy nodular infiltrates demonstrated throughout both lungs, new since prior study, most likely representing multifocal pneumonia. Consider COVID pneumonia. Metastatic disease would be a less likely consideration. No pleural effusions. No pneumothorax. Upper Abdomen: No acute abnormality. Musculoskeletal: No chest wall abnormality. No acute or significant osseous findings. Review of the MIP images confirms the above findings. IMPRESSION: 1. No evidence of significant pulmonary embolus. 2. Patchy nodular infiltrates demonstrated  throughout both lungs most likely representing multifocal pneumonia. Electronically Signed   By: Oren Beckmann.D.  On: 04/25/2022 21:36    EKG: I independently viewed the EKG done and my findings are as followed: Sinus tachycardia at a rate of 107 bpm with PACs and QTc 515 ms  Assessment/Plan Present on Admission: . Multifocal pneumonia  Principal Problem:   Multifocal pneumonia  Acute respiratory failure with hypoxia secondary to sepsis due to multifocal pneumonia Leukocytosis in setting of above Hypokalemia Prolonged QTc (515 ms) Type 2 diabetes mellitus with hyperglycemia Obesity  DVT prophylaxis: ***   Code Status: ***   Family Communication: ***   Disposition Plan: ***   Consults called: ***   Admission status: ***     Bernadette Hoit MD [facility] Pager [phone]  If 7PM-7AM, please contact night-coverage [email or web address] Password [facility]  04/25/2022, 11:43 PM         Review of Systems: {ROS_Text:[id number]} Past Medical History:  Diagnosis Date  . Acute on chronic diastolic CHF (congestive heart failure) ([place]) 03/05/2015   Grade 2. EF 60-65%  . Anemia    hx of  . Anxiety   . Arthritis   . CAD (coronary artery disease)    s/p Stenting in 2005;  [place] 7/09: Vigorous LV function, 2-3+ MR, RI 40%, RI stent patent, proximal-mid RCA 40-50%;  [place] 7/09:  EF 55-65%, trivial MR;  Myoview 11/18/12: EF 66%, normal LV wall motion, mild to moderate anterior ischemia - reviewed by [facility] and felt to rep breast atten (low risk);  [place] 6/14: mild LVH, EF 55-65%, Gr 2 DD, PASP 44, trivial eff   . Chronic kidney disease    CREATININE IS UP--GOING TO KIDNEY MD   . Chronic neck pain   . COPD (chronic obstructive pulmonary disease) ([place])   . Degenerative joint disease    left shoulder  . Diabetic neuropathy ([place])   . Diverticulosis   . Esophageal stricture   . GERD (gastroesophageal reflux disease)   . Hyperlipidemia   . Hypertension   . IDDM (insulin dependent  diabetes mellitus)   . Pericardial effusion 03/06/2015   Very small per echo ; pleural nodules seen on CT chest.  . Peripheral vascular disease ([place])   . Stroke [facility])    "mini- years ago"  . Vitamin D deficiency    Past Surgical History:  Procedure Laterality Date  . A/V FISTULAGRAM N/A 08/10/2017   Procedure: A/V Fistulagram - Right Arm;  Surgeon: Elam Dutch, MD;  Location: [place] CV LAB;  Service: Cardiovascular;  Laterality: N/A;  . A/V FISTULAGRAM N/A 03/03/2020   Procedure: A/V FISTULAGRAM - Right Arm;  Surgeon: Marty Heck, MD;  Location: [place] CV LAB;  Service: Cardiovascular;  Laterality: N/A;  . ABDOMINAL HYSTERECTOMY    . AV FISTULA PLACEMENT Right 07/20/2015   Procedure: RADIAL CEPHALIC ARTERIOVENOUS FISTULA CREATION RIGHT ARM;  Surgeon: Angelia Mould, MD;  Location: [place];  Service: Vascular;  Laterality: Right;  . AV FISTULA PLACEMENT Right 11/26/2015   Procedure:  Creation of RIGHT BRACHIO-CEPHALIC arteriovenous fistula;  Surgeon: Angelia Mould, MD;  Location: [place];  Service: Vascular;  Laterality: Right;  . BACK SURGERY     2007 & 2013  . BLADDER SURGERY     Tack  . CARDIAC CATHETERIZATION  2003, 2005, 2009   1 stent  . cardiac stents    . CARDIOVERSION N/A 04/24/2022   Procedure: CARDIOVERSION;  Surgeon: Fay Records, MD;  Location: [place];  Service: Cardiovascular;  Laterality: N/A;  . CATARACT EXTRACTION W/PHACO Right 05/04/2014   Procedure: CATARACT  EXTRACTION PHACO AND INTRAOCULAR LENS PLACEMENT (I[facility]);  Surgeon: Williams Che, MD;  [facility];  Service: Ophthalmology;  Laterality: Right;  CDE 1.47  . CATARACT EXTRACTION W/PHACO Left 07/20/2014   Procedure: CATARACT EXTRACTION WITH PHACO AND INTRAOCULAR LENS PLACEMENT LEFT EYE CDE=1.79;  Surgeon: Williams Che, MD;  [facility];  Service: Ophthalmology;  Laterality: Left;  . CERVICAL SPINE SURGERY  2012   8 screws  . CORONARY ATHERECTOMY N/A 03/08/2022    Procedure: CORONARY ATHERECTOMY;  Surgeon: Sherren Mocha, MD;  Location: [place] CV LAB;  Service: Cardiovascular;  Laterality: N/A;  . CYSTOSCOPY WITH INJECTION N/A 06/03/2013   Procedure: MACROPLASTIQUE  INJECTION ;  Surgeon: Malka So, MD;  [facility];  Service: Urology;  Laterality: N/A;  . FISTULOGRAM Right 10/20/2016   Procedure: FISTULOGRAM WITH POSSIBLE INTERVENTION;  Surgeon: [name], MD;  [facility];  Service: Vascular;  Laterality: Right;  . INSERTION OF DIALYSIS CATHETER N/A 11/26/2015   Procedure: INSERTION OF DIALYSIS CATHETER;  Surgeon: Angelia Mould, MD;  Location: [place];  Service: Vascular;  Laterality: N/A;  . INTRAVASCULAR IMAGING/OCT N/A 03/08/2022   Procedure: INTRAVASCULAR IMAGING/OCT;  Surgeon: Sherren Mocha, MD;  Location: [place] CV LAB;  Service: Cardiovascular;  Laterality: N/A;  . LAMINECTOMY  12/29/2011  . LEFT HEART CATH AND CORONARY ANGIOGRAPHY N/A 03/06/2022   Procedure: LEFT HEART CATH AND CORONARY ANGIOGRAPHY;  Surgeon: Nelva Bush, MD;  Location: [place] CV LAB;  Service: Cardiovascular;  Laterality: N/A;  . LUMBAR LAMINECTOMY/DECOMPRESSION MICRODISCECTOMY  12/29/2011   Procedure: LUMBAR LAMINECTOMY/DECOMPRESSION MICRODISCECTOMY;  Surgeon: Floyce Stakes, MD;  Location: [place] NEURO ORS;  Service: Neurosurgery;  Laterality: N/A;  Lumbar Two through Lumbar Five Laminectomies/Cellsaver  . PARTIAL HYSTERECTOMY    . PERIPHERAL VASCULAR BALLOON ANGIOPLASTY  03/03/2020   Procedure: PERIPHERAL VASCULAR BALLOON ANGIOPLASTY;  Surgeon: Marty Heck, MD;  Location: [place] CV LAB;  Service: Cardiovascular;;  Rt arm fistula  . PERIPHERAL VASCULAR BALLOON ANGIOPLASTY Right 04/15/2021   Procedure: PERIPHERAL VASCULAR BALLOON ANGIOPLASTY;  Surgeon: Angelia Mould, MD;  Location: [place] CV LAB;  Service: Cardiovascular;  Laterality: Right;  arm fistula  . PERIPHERAL VASCULAR BALLOON ANGIOPLASTY  03/14/2022    Procedure: PERIPHERAL VASCULAR BALLOON ANGIOPLASTY;  Surgeon: Broadus John, MD;  Location: [place] CV LAB;  Service: Cardiovascular;;  right arm AVF  . PERIPHERAL VASCULAR CATHETERIZATION N/A 11/22/2015   Procedure: Fistulagram;  Surgeon: Angelia Mould, MD;  Location: [place] CV LAB;  Service: Cardiovascular;  Laterality: N/A;  . PERIPHERAL VASCULAR INTERVENTION  08/10/2017   Procedure: PERIPHERAL VASCULAR INTERVENTION;  Surgeon: Elam Dutch, MD;  Location: [place] CV LAB;  Service: Cardiovascular;;  . SHOULDER SURGERY Right    Rotator cuff   Social History:  reports that she quit smoking about 20 years ago. Her smoking use included cigarettes. She has a 30.00 pack-year smoking history. She has never used smokeless tobacco. She reports that she does not drink alcohol and does not use drugs.  Allergies  Allergen Reactions  . Azithromycin     Prolonged QT  . Ace Inhibitors Cough  . Clopidogrel Bisulfate Other (See Comments)    Sick, Headache, "Felt terrible"  . Codeine Other (See Comments)    hallucinations  . Lisinopril Cough  . Penicillins Other (See Comments)    Unknown childhood reaction Did it involve swelling of the face/tongue/throat, SOB, or low BP? Unknown Did it involve sudden or severe rash/hives, skin  peeling, or any reaction on the inside of your mouth or nose? Unknown Did you need to seek medical attention at a hospital or doctor's office? Unknown When did it last happen?      childhood allergy If all above answers are "NO", may proceed with cephalosporin use.      Family History  Problem Relation Age of Onset  . Heart disease Other   . Hypertension Father   . Cancer Father   . Hypertension Mother   . Hypertension Daughter   . Pancreatitis Brother   . Hypertension Daughter   . Diabetes Daughter     Prior to Admission medications   Medication Sig Start Date End Date Taking? Authorizing Provider  acetaminophen (TYLENOL) 500 MG tablet  Take 1,000 mg by mouth every 6 (six) hours as needed for moderate pain or headache.   Yes [provider]  albuterol (PROVENTIL) (2.5 MG/3ML) 0.083% nebulizer solution Take 3 mLs (2.5 mg total) by nebulization every 6 (six) hours as needed for wheezing or shortness of breath. 12/02/21  Yes [name], MD  albuterol (VENTOLIN HFA) 108 (90 Base) MCG/ACT inhaler Inhale 2 puffs into the lungs every 6 (six) hours as needed for wheezing or shortness of breath. 12/02/21  Yes [name], MD  apixaban (ELIQUIS) 5 MG TABS tablet Take 1 tablet (5 mg total) by mouth 2 (two) times daily. 04/05/22  Yes Minus Breeding, MD  atenolol (TENORMIN) 100 MG tablet Take 1 tablet (100 mg total) by mouth at bedtime. 03/31/22 04/30/22 Yes Phillips Grout, MD  budesonide-formoterol [facility]) 160-4.5 MCG/ACT inhaler Inhale 2 puffs into the lungs 2 (two) times daily. 11/15/21  Yes [name], MD  calcium acetate (PHOSLO) 667 MG capsule Take 667-1,334 mg by mouth See admin instructions. Take 1334 mg with meals 3 times daily and 667 mg with snacks   Yes [provider]  Carboxymethylcellul-Glycerin (LUBRICATING EYE DROPS OP) Place 1 drop into both eyes daily as needed (dry eyes).   Yes [provider]  cetirizine (ZYRTEC) 10 MG tablet Take 1 tablet (10 mg total) by mouth daily as needed for allergies. Patient taking differently: Take 10 mg by mouth daily. 02/08/22  Yes [name], MD  diltiazem (CARDIZEM CD) 240 MG 24 hr capsule Take 1 capsule (240 mg total) by mouth daily. 04/12/22 05/12/22 Yes [name], [name], MD  gabapentin (NEURONTIN) 400 MG capsule Take 2 capsules (800 mg total) by mouth See admin instructions. Take it after HD Patient taking differently: Take 800 mg by mouth See admin instructions. Take after dialysis 03/22/22  Yes [name], MD  hydrOXYzine (ATARAX) 50 MG tablet Take 1 tablet (50 mg total) by mouth 3 (three) times daily as needed for anxiety. 03/22/22  Yes [name], [name],  MD  Insulin Glargine [facility] KWIKPEN) 100 UNIT/ML Inject 25 Units into the skin at bedtime. Add five units each time the fasting is over 125 three days in a row. 11/15/21  Yes [name], MD  lidocaine-prilocaine (EMLA) cream Apply 1 application. topically as needed (dialysis).   Yes [provider]  nitroGLYCERIN (NITROSTAT) 0.4 MG SL tablet Place 1 tablet (0.4 mg total) under the tongue every 5 (five) minutes as needed for chest pain. 11/09/21  Yes Minus Breeding, MD  omeprazole (PRILOSEC) 20 MG capsule Take 1 capsule (20 mg total) by mouth daily. 12/12/21  Yes [name], [name], MD  ondansetron (ZOFRAN) 4 MG tablet TAKE 1 TABLET BY MOUTH EVERY 8 HOURS AS NEEDED FOR NAUSEA AND VOMITING 04/07/22  Yes [name], MD  polyethylene glycol (MIRALAX / GLYCOLAX) 17 g packet Take 17 g by mouth daily as needed for moderate constipation or mild constipation. 03/10/22  Yes Pokhrel, Laxman, MD  QUEtiapine (SEROQUEL) 200 MG tablet Take 1 tablet (200 mg total) by mouth at bedtime. For sleep and for anxiety 03/17/22  Yes [name], [name], MD  rOPINIRole (REQUIP) 1 MG tablet Take 1 tablet (1 mg total) by mouth at bedtime. For leg cramps 09/15/21  Yes [name], [name], MD  rosuvastatin (CRESTOR) 5 MG tablet Take 1 tablet (5 mg total) by mouth daily. For cholesterol Patient taking differently: Take 5 mg by mouth at bedtime. For cholesterol 12/12/21  Yes [name], [name], MD  Semaglutide,0.25 or 0.5MG /DOS, (OZEMPIC, 0.25 OR 0.5 MG/DOSE,) 2 MG/1.5ML SOPN Inject 0.5 mg into the skin every Friday.   Yes [provider]  ticagrelor (BRILINTA) 90 MG TABS tablet Take 1 tablet (90 mg total) by mouth 2 (two) times daily. 04/05/22 06/04/22 Yes Minus Breeding, MD  torsemide (DEMADEX) 20 MG tablet Take 40-60 mg by mouth daily. 01/19/22  Yes [provider]  Continuous Blood Gluc Receiver (FREESTYLE LIBRE 2 READER) DEVI USE TO TEST BLOOD SUGAR CONTINUOUSLY 11/15/21   [name], MD  Continuous Blood Gluc Sensor  (FREESTYLE LIBRE 2 SENSOR) MISC Use multiple times daily to track glucose to prevent highs and lows as a complication of dialysis Dx:Z99.2, E11.59 09/15/21   [name], MD  famotidine (PEPCID) 20 MG tablet Take 1 tablet (20 mg total) by mouth 2 (two) times daily. Patient not taking: Reported on 04/25/2022 02/15/22   Dettinger, Fransisca Kaufmann, MD  glucose blood (ONETOUCH ULTRA) test strip Use to test blood sugar 3 times daily as directed. DX: E11.9 09/13/20   [name], MD    Physical Exam: Vitals:   04/25/22 2145 04/25/22 2200 04/25/22 2230 04/25/22 2300  BP:  (!) 162/88 (!) 157/64 (!) 176/82  Pulse: (!) 103 (!) 103 (!) 102 (!) 104  Resp: (!) 26 (!) 24 (!) 25 (!) 21  Temp:      TempSrc:      SpO2: 100% 100% 100% 100%   *** Data Reviewed: {Tip this will not be part of the note when signed- Document your independent interpretation of telemetry tracing, EKG, lab, Radiology test or any other diagnostic tests. Add any new diagnostic test ordered today. (Optional):26781} {Results:26384}  Assessment and Plan: No notes have been filed under this hospital service. Service: Hospitalist     Advance Care Planning:   Code Status: Prior ***  Consults: ***  Family Communication: ***  Severity of Illness: {Observation/Inpatient:21159}  Author: Bernadette Hoit, DO 04/25/2022 11:18 PM  For on call review [email or web address].

## 2022-04-25 NOTE — ED Notes (Addendum)
Patient transported to X-ray-reported that pt's Freestyle was removed for xray.

## 2022-04-25 NOTE — ED Provider Notes (Signed)
  Face-to-face evaluation   History: She complains of palpitations, fast heartbeat and ongoing shortness of breath, with cardioversion yesterday, for atrial fibrillation.  She denies fever but has a low-grade temperature here.  She undertakes dialysis regularly.  Physical exam: Obese, alert cooperative.  She is mildly tachypneic.  No respiratory distress.  No dysarthria or aphasia  MDM: Evaluation for  Chief Complaint  Patient presents with   Atrial Fibrillation     Palpitations, with PACs on EKG monitor, likely causing her sensation of palpitation.  No clear evidence for cause of shortness of breath.  Will rule out PE hopefully with CT imaging.  Consult nephrology prior to CTA.  11:10 PM-patient relatively stable, mild tachycardia and hypertension.  Oxygenation 100%.  Case discussed with hospitalist who agrees to admit patient for monitoring and further care as needed.  No strong evidence for sepsis at this time.  Medical screening examination/treatment/procedure(s) were conducted as a shared visit with non-physician practitioner(s) and myself.  I personally evaluated the patient during the encounter    Daleen Bo, MD 04/25/22 [phone]

## 2022-04-25 NOTE — H&P (Signed)
History and Physical    Patient: [name]:[id number] DOB: 1951-07-06 DOA: 04/25/2022 DOS: the patient was seen and examined on 04/26/2022 PCP: [name], MD  Patient coming from: Home  Chief Complaint:  Chief Complaint  Patient presents with   Atrial Fibrillation   HPI: LAVILLA DELAMORA is a 71 y.o. female with medical history significant of hypertension, type 2 diabetes mellitus, ESRD on HD (TTS), paroxysmal A-fib, COPD, GERD, CAD s/p stent placement who presents to the emergency department due to worsening shortness of breath and palpitations that has been ongoing since yesterday.  Patient has a history of atrial fibrillation and she was cardioverted yesterday by Dr. Harrington Challenger after which she went back to sinus rhythm.  Patient complained of worsening shortness of breath in the evening, along with palpitation and temperature of 100.31F which self resolved this morning.  This was associated with nonradiating, nonreproducible midsternal chest tightness.  This morning, she went to dialysis and she was placed on oxygen during dialysis, however, she was asked to go to the ED after the dialysis for further work-up and management.  ED Course:  In the emergency department, she was tachypneic, tachycardic, febrile (100.66F).  BP was 160/61.  Work-up in the ED showed leukocytosis and normocytic anemia.  BMP showed hypokalemia, BUN to creatinine 13/1.37, CBG 199, troponin x2 - 61 > 66.  Urinalysis was unimpressive for UTI.  Influenza A, B, SARS coronavirus 2 was negative. Chest x-ray showed cardiomegaly without acute abnormality of the lungs CT angiography chest with contrast showed no evidence of significant pulmonary embolus, but showed patchy nodular infiltrates demonstrated throughout both lungs most likely representing multifocal pneumonia. She was initially started on fosfomycin due to presumed UTI, but after CT angiogram of chest showed multifocal pneumonia, IV Levaquin was indicated.   Hospitalist was asked to admit patient for further evaluation and management.  Review of Systems: Review of systems as noted in the HPI. All other systems reviewed and are negative.   Past Medical History:  Diagnosis Date   Acute on chronic diastolic CHF (congestive heart failure) ([place]) 03/05/2015   Grade 2. EF 60-65%   Anemia    hx of   Anxiety    Arthritis    CAD (coronary artery disease)    s/p Stenting in 2005;  [place] 7/09: Vigorous LV function, 2-3+ MR, RI 40%, RI stent patent, proximal-mid RCA 40-50%;  [place] 7/09:  EF 55-65%, trivial MR;  [place] 11/18/12: EF 66%, normal LV wall motion, mild to moderate anterior ischemia - reviewed by [facility] and felt to rep breast atten (low risk);  E[place] 6/14: mild LVH, EF 55-65%, Gr 2 DD, PASP 44, trivial eff    Chronic kidney disease    CREATININE IS UP--GOING TO KIDNEY MD    Chronic neck pain    COPD (chronic obstructive pulmonary disease) (HCC)    Degenerative joint disease    left shoulder   Diabetic neuropathy (HCC)    Diverticulosis    Esophageal stricture    GERD (gastroesophageal reflux disease)    Hyperlipidemia    Hypertension    IDDM (insulin dependent diabetes mellitus)    Pericardial effusion 03/06/2015   Very small per echo ; pleural nodules seen on CT chest.   Peripheral vascular disease ([place])    Stroke ([place])    "mini- years ago"   Vitamin D deficiency    Past Surgical History:  Procedure Laterality Date   A/V FISTULAGRAM N/A 08/10/2017   Procedure: A/V Fistulagram - Right  Arm;  Surgeon: Elam Dutch, MD;  Location: [place] [facility];  Service: Cardiovascular;  Laterality: N/A;   A/V FISTULAGRAM N/A 03/03/2020   Procedure: A/V FISTULAGRAM - Right Arm;  Surgeon: Marty Heck, MD;  Location: [place] [facility];  Service: Cardiovascular;  Laterality: N/A;   ABDOMINAL HYSTERECTOMY     AV FISTULA PLACEMENT Right 07/20/2015   Procedure: RADIAL CEPHALIC ARTERIOVENOUS FISTULA CREATION RIGHT ARM;  Surgeon: Angelia Mould, MD;  Location: [place];  Service: Vascular;  Laterality: Right;   AV FISTULA PLACEMENT Right 11/26/2015   Procedure:  Creation of RIGHT BRACHIO-CEPHALIC arteriovenous fistula;  Surgeon: Angelia Mould, MD;  Location: [facility];  Service: Vascular;  Laterality: Right;   BACK SURGERY     2007 & 2013   [place]  2003, 2005, 2009   1 stent   cardiac stents     CARDIOVERSION N/A 04/24/2022   Procedure: CARDIOVERSION;  Surgeon: Fay Records, MD;  Location: [facility] ENDOSCOPY;  Service: Cardiovascular;  Laterality: N/A;   CATARACT EXTRACTION W/PHACO Right 05/04/2014   Procedure: CATARACT EXTRACTION PHACO AND INTRAOCULAR LENS PLACEMENT ([place]);  Surgeon: Williams Che, MD;  Location: [facility];  Service: Ophthalmology;  Laterality: Right;  CDE 1.47   CATARACT EXTRACTION W/PHACO Left 07/20/2014   Procedure: CATARACT EXTRACTION WITH PHACO AND INTRAOCULAR LENS PLACEMENT LEFT EYE CDE=1.79;  Surgeon: Williams Che, MD;  Location: [facility];  Service: Ophthalmology;  Laterality: Left;   CERVICAL SPINE SURGERY  2012   8 screws   CORONARY ATHERECTOMY N/A 03/08/2022   Procedure: CORONARY ATHERECTOMY;  Surgeon: Sherren Mocha, MD;  Location: [place] [facility];  Service: Cardiovascular;  Laterality: N/A;   CYSTOSCOPY WITH INJECTION N/A 06/03/2013   Procedure: MACROPLASTIQUE  INJECTION ;  Surgeon: Malka So, MD;  Location: [facility];  Service: Urology;  Laterality: N/A;   FISTULOGRAM Right 10/20/2016   Procedure: FISTULOGRAM WITH POSSIBLE INTERVENTION;  Surgeon: [name], MD;  Location: [facility];  Service: Vascular;  Laterality: Right;   INSERTION OF DIALYSIS CATHETER N/A 11/26/2015   Procedure: INSERTION OF DIALYSIS CATHETER;  Surgeon: Angelia Mould, MD;  Location: [place];  Service: Vascular;  Laterality: N/A;   INTRAVASCULAR IMAGING/OCT N/A 03/08/2022   Procedure: INTRAVASCULAR IMAGING/OCT;  Surgeon: Sherren Mocha, MD;  Location: [place] [facility];   Service: Cardiovascular;  Laterality: N/A;   LAMINECTOMY  12/29/2011   LEFT HEART CATH AND CORONARY ANGIOGRAPHY N/A 03/06/2022   Procedure: LEFT HEART CATH AND CORONARY ANGIOGRAPHY;  Surgeon: Nelva Bush, MD;  Location: [place] [facility];  Service: Cardiovascular;  Laterality: N/A;   LUMBAR LAMINECTOMY/DECOMPRESSION MICRODISCECTOMY  12/29/2011   Procedure: LUMBAR LAMINECTOMY/DECOMPRESSION MICRODISCECTOMY;  Surgeon: Floyce Stakes, MD;  Location: [place] [facility];  Service: Neurosurgery;  Laterality: N/A;  Lumbar Two through Lumbar Five Laminectomies/Cellsaver   PARTIAL HYSTERECTOMY     PERIPHERAL VASCULAR BALLOON ANGIOPLASTY  03/03/2020   Procedure: PERIPHERAL VASCULAR BALLOON ANGIOPLASTY;  Surgeon: Marty Heck, MD;  Location: [place] [facility];  Service: Cardiovascular;;  Rt arm fistula   PERIPHERAL VASCULAR BALLOON ANGIOPLASTY Right 04/15/2021   Procedure: PERIPHERAL VASCULAR BALLOON ANGIOPLASTY;  Surgeon: Angelia Mould, MD;  Location: [place] [facility];  Service: Cardiovascular;  Laterality: Right;  arm fistula   PERIPHERAL VASCULAR BALLOON ANGIOPLASTY  03/14/2022   Procedure: PERIPHERAL VASCULAR BALLOON ANGIOPLASTY;  Surgeon: Broadus John, MD;  Location: [place] [facility];  Service: Cardiovascular;;  right arm AVF   PERIPHERAL VASCULAR CATHETERIZATION N/A 11/22/2015   Procedure: Fistulagram;  Surgeon: Angelia Mould, MD;  Location: [place] [facility];  Service: Cardiovascular;  Laterality: N/A;   PERIPHERAL VASCULAR INTERVENTION  08/10/2017   Procedure: PERIPHERAL VASCULAR INTERVENTION;  Surgeon: Elam Dutch, MD;  Location: [place] [facility];  Service: Cardiovascular;;   SHOULDER SURGERY Right    Rotator cuff    Social History:  reports that she quit smoking about 20 years ago. Her smoking use included cigarettes. She has a 30.00 pack-year smoking history. She has never used smokeless tobacco. She reports that she does not drink alcohol and does not use  drugs.   Allergies  Allergen Reactions   Azithromycin     Prolonged QT   Ace Inhibitors Cough   Clopidogrel Bisulfate Other (See Comments)    Sick, Headache, "Felt terrible"   Codeine Other (See Comments)    hallucinations   Lisinopril Cough   Penicillins Other (See Comments)    Unknown childhood reaction Did it involve swelling of the face/tongue/throat, SOB, or low BP? Unknown Did it involve sudden or severe rash/hives, skin peeling, or any reaction on the inside of your mouth or nose? Unknown Did you need to seek medical attention at a hospital or doctor's office? Unknown When did it last happen?      childhood allergy If all above answers are "NO", may proceed with cephalosporin use.      Family History  Problem Relation Age of Onset   Heart disease Other    Hypertension Father    Cancer Father    Hypertension Mother    Hypertension Daughter    Pancreatitis Brother    Hypertension Daughter    Diabetes Daughter      Prior to Admission medications   Medication Sig Start Date End Date Taking? Authorizing Provider  acetaminophen (TYLENOL) 500 MG tablet Take 1,000 mg by mouth every 6 (six) hours as needed for moderate pain or headache.   Yes [provider]  albuterol (PROVENTIL) (2.5 MG/3ML) 0.083% nebulizer solution Take 3 mLs (2.5 mg total) by nebulization every 6 (six) hours as needed for wheezing or shortness of breath. 12/02/21  Yes [name], MD  albuterol (VENTOLIN HFA) 108 (90 Base) MCG/ACT inhaler Inhale 2 puffs into the lungs every 6 (six) hours as needed for wheezing or shortness of breath. 12/02/21  Yes [name], MD  apixaban (ELIQUIS) 5 MG TABS tablet Take 1 tablet (5 mg total) by mouth 2 (two) times daily. 04/05/22  Yes Minus Breeding, MD  atenolol (TENORMIN) 100 MG tablet Take 1 tablet (100 mg total) by mouth at bedtime. 03/31/22 04/30/22 Yes Phillips Grout, MD  budesonide-formoterol [facility]) 160-4.5 MCG/ACT inhaler Inhale 2 puffs into  the lungs 2 (two) times daily. 11/15/21  Yes [name], MD  calcium acetate (PHOSLO) 667 MG capsule Take 667-1,334 mg by mouth See admin instructions. Take 1334 mg with meals 3 times daily and 667 mg with snacks   Yes [provider]  Carboxymethylcellul-Glycerin (LUBRICATING EYE DROPS OP) Place 1 drop into both eyes daily as needed (dry eyes).   Yes [provider]  cetirizine (ZYRTEC) 10 MG tablet Take 1 tablet (10 mg total) by mouth daily as needed for allergies. Patient taking differently: Take 10 mg by mouth daily. 02/08/22  Yes [name], MD  diltiazem (CARDIZEM CD) 240 MG 24 hr capsule Take 1 capsule (240 mg total) by mouth daily. 04/12/22 05/12/22  Yes [name], MD  gabapentin (NEURONTIN) 400 MG capsule Take 2 capsules (800 mg total) by mouth See admin instructions. Take it after HD Patient taking differently: Take 800 mg by mouth See admin instructions. Take after dialysis 03/22/22  Yes [name], MD  hydrOXYzine (ATARAX) 50 MG tablet Take 1 tablet (50 mg total) by mouth 3 (three) times daily as needed for anxiety. 03/22/22  Yes [name], [name], MD  Insulin Glargine [facility] KWIKPEN) 100 UNIT/ML Inject 25 Units into the skin at bedtime. Add five units each time the fasting is over 125 three days in a row. 11/15/21  Yes [name], MD  lidocaine-prilocaine (EMLA) cream Apply 1 application. topically as needed (dialysis).   Yes [provider]  nitroGLYCERIN (NITROSTAT) 0.4 MG SL tablet Place 1 tablet (0.4 mg total) under the tongue every 5 (five) minutes as needed for chest pain. 11/09/21  Yes Minus Breeding, MD  omeprazole (PRILOSEC) 20 MG capsule Take 1 capsule (20 mg total) by mouth daily. 12/12/21  Yes [name], [name], MD  ondansetron (ZOFRAN) 4 MG tablet TAKE 1 TABLET BY MOUTH EVERY 8 HOURS AS NEEDED FOR NAUSEA AND VOMITING 04/07/22  Yes [name], [name], MD  polyethylene glycol (MIRALAX / GLYCOLAX) 17 g packet Take 17 g by mouth daily as needed for  moderate constipation or mild constipation. 03/10/22  Yes Pokhrel, Laxman, MD  QUEtiapine (SEROQUEL) 200 MG tablet Take 1 tablet (200 mg total) by mouth at bedtime. For sleep and for anxiety 03/17/22  Yes [name], [name], MD  rOPINIRole (REQUIP) 1 MG tablet Take 1 tablet (1 mg total) by mouth at bedtime. For leg cramps 09/15/21  Yes [name], [name], MD  rosuvastatin (CRESTOR) 5 MG tablet Take 1 tablet (5 mg total) by mouth daily. For cholesterol Patient taking differently: Take 5 mg by mouth at bedtime. For cholesterol 12/12/21  Yes [name], [name], MD  Semaglutide,0.25 or 0.5MG/DOS, (OZEMPIC, 0.25 OR 0.5 MG/DOSE,) 2 MG/1.5ML SOPN Inject 0.5 mg into the skin every Friday.   Yes [provider]  ticagrelor (BRILINTA) 90 MG TABS tablet Take 1 tablet (90 mg total) by mouth 2 (two) times daily. 04/05/22 06/04/22 Yes Minus Breeding, MD  torsemide (DEMADEX) 20 MG tablet Take 40-60 mg by mouth daily. 01/19/22  Yes [provider]  Continuous Blood Gluc Receiver (FREESTYLE LIBRE 2 READER) DEVI USE TO TEST BLOOD SUGAR CONTINUOUSLY 11/15/21   [name], MD  Continuous Blood Gluc Sensor (FREESTYLE LIBRE 2 SENSOR) MISC Use multiple times daily to track glucose to prevent highs and lows as a complication of dialysis Dx:Z99.2, E11.59 09/15/21   [name], MD  famotidine (PEPCID) 20 MG tablet Take 1 tablet (20 mg total) by mouth 2 (two) times daily. Patient not taking: Reported on 04/25/2022 02/15/22   Dettinger, Fransisca Kaufmann, MD  glucose blood (ONETOUCH ULTRA) test strip Use to test blood sugar 3 times daily as directed. DX: E11.9 09/13/20   [name], MD    Physical Exam: BP (!) 165/73 (BP Location: Left Arm)   Pulse (!) 104   Temp 98 F (36.7 C) (Oral)   Resp (!) 25   SpO2 100%   General: 71 y.o. year-old female well developed well nourished in no acute distress.  Alert and oriented x3. HEENT: NCAT, EOMI Neck: Supple, trachea medial Cardiovascular: Tachycardia.  Regular rate and rhythm  with no rubs or gallops.  No thyromegaly or JVD noted.  No lower extremity edema. 2/4 pulses in all 4 extremities. Respiratory: Tachypnea.  Decreased breath sounds  on auscultation with no wheezes or rales.  Abdomen: Soft, nontender nondistended with normal bowel sounds x4 quadrants. Muskuloskeletal: No cyanosis, clubbing or edema noted bilaterally Neuro: CN II-XII intact, strength 5/5 x 4, sensation, reflexes intact Skin: No ulcerative lesions noted or rashes Psychiatry: Judgement and insight appear normal. Mood is appropriate for condition and setting          Labs on Admission:  Basic Metabolic Panel: Recent Labs  Lab 04/19/22 1456 04/25/22 1345 04/25/22 2052 04/26/22 0023  NA 137 135  --  132*  K 3.5 2.8* 3.1* 3.5  CL 93* 93*  --  93*  CO2 28 31  --  28  GLUCOSE 143* 199*  --  193*  BUN 24 13  --  18  CREATININE 7.65* 5.37*  --  6.40*  CALCIUM 9.6 8.6*  --  8.5*  MG  --   --   --  1.9  PHOS  --   --   --  4.5   Liver Function Tests: Recent Labs  Lab 04/26/22 0023  AST 14*  ALT 17  ALKPHOS 72  BILITOT 0.5  PROT 6.7  ALBUMIN 3.3*   No results for input(s): LIPASE, AMYLASE in the last 168 hours. No results for input(s): AMMONIA in the last 168 hours. CBC: Recent Labs  Lab 04/19/22 1456 04/25/22 1345 04/26/22 0023  WBC 9.3 13.5* 15.1*  HGB 10.0* 11.0* 10.2*  HCT 31.9* 34.7* 32.1*  MCV 89 87.6 88.2  PLT 300 342 306   Cardiac Enzymes: No results for input(s): CKTOTAL, CKMB, CKMBINDEX, TROPONINI in the last 168 hours.  BNP (last 3 results) Recent Labs    12/06/21 0137 02/28/22 2351 03/26/22 1407  BNP 2,593.0* 1,695.0* 2,276.0*    ProBNP (last 3 results) No results for input(s): PROBNP in the last 8760 hours.  CBG: Recent Labs  Lab 04/24/22 0918  GLUCAP 154*    Radiological Exams on Admission: DG Chest 2 View  Result Date: 04/25/2022 CLINICAL DATA:  AFib, palpitations, cardioversion yesterday EXAM: CHEST - 2 VIEW COMPARISON:  03/29/2022  FINDINGS: Cardiomegaly. Both lungs are clear. Disc degenerative disease of the thoracic spine. IMPRESSION: Cardiomegaly without acute abnormality of the lungs. Electronically Signed   By: Delanna Ahmadi M.D.   On: 04/25/2022 14:39   CT Angio Chest PE W and/or Wo Contrast  Result Date: 04/25/2022 CLINICAL DATA:  Pulmonary embolus suspected with high probability. Cardioversion yesterday. Heart is racing with shortness of breath. Atrial fibrillation. EXAM: CT ANGIOGRAPHY CHEST WITH CONTRAST TECHNIQUE: Multidetector CT imaging of the chest was performed using the standard protocol during bolus administration of intravenous contrast. Multiplanar CT image reconstructions and MIPs were obtained to evaluate the vascular anatomy. RADIATION DOSE REDUCTION: This exam was performed according to the departmental dose-optimization program which includes automated exposure control, adjustment of the mA and/or kV according to patient size and/or use of iterative reconstruction technique. CONTRAST:  187m OMNIPAQUE IOHEXOL 350 MG/ML SOLN COMPARISON:  02/02/2021 FINDINGS: Cardiovascular: Good opacification of the central and segmental pulmonary arteries. No focal filling defects. No evidence of significant pulmonary embolus. Diffuse cardiac enlargement. No pericardial effusions. Normal caliber thoracic aorta. Calcification of the aorta and coronary arteries. Mediastinum/Nodes: Esophagus is decompressed. Mediastinal lymph nodes are prominent, similar to prior study. Likely reactive. Thyroid gland is unremarkable. Lungs/Pleura: Patchy nodular infiltrates demonstrated throughout both lungs, new since prior study, most likely representing multifocal pneumonia. Consider COVID pneumonia. Metastatic disease would be a less likely consideration. No pleural effusions. No pneumothorax. Upper Abdomen:  No acute abnormality. Musculoskeletal: No chest wall abnormality. No acute or significant osseous findings. Review of the MIP images confirms  the above findings. IMPRESSION: 1. No evidence of significant pulmonary embolus. 2. Patchy nodular infiltrates demonstrated throughout both lungs most likely representing multifocal pneumonia. Electronically Signed   By: Lucienne Capers M.D.   On: 04/25/2022 21:36    EKG: I independently viewed the EKG done and my findings are as followed: Sinus tachycardia at a rate of 107 bpm with PACs and QTc 515 ms  Assessment/Plan Present on Admission:  Multifocal pneumonia  Prolonged Q-T interval on ECG  Non-ST elevation (NSTEMI) myocardial infarction ([place])  Obesity (BMI 30.0-34.9)  Mixed hyperlipidemia  Hypokalemia  Essential hypertension  Elevated troponin  Principal Problem:   Multifocal pneumonia Active Problems:   Prolonged Q-T interval on ECG   ESRD (end stage renal disease) on dialysis (HCC)   Obesity (BMI 30.0-34.9)   Essential hypertension   Mixed hyperlipidemia   Elevated troponin   Hypokalemia   Non-ST elevation (NSTEMI) myocardial infarction (HCC)   Acute respiratory failure with hypoxia (HCC)   Sepsis (HCC)  Acute respiratory failure with hypoxia secondary to sepsis due to multifocal pneumonia Patient met sepsis criteria due to being tachypneic, tachycardic, febrile and having leukocytosis CT angiography of chest was suggestive of multifocal pneumonia Patient was started on ceftriaxone and doxycycline, we shall continue same at this time with plan to de-escalate/discontinue based on blood culture, sputum culture, urine Legionella, strep pneumo and procalcitonin Continue Tylenol as needed Continue Mucinex, incentive spirometry, flutter valve   Leukocytosis in setting of above WBC 13.5, continue management as described above  Hypokalemia K+ is 2.8 > 3.1 K+ will be replenished Please monitor for AM K+ for further replenishmemnt  Prolonged QTc (515 ms) Avoid QT prolonging drugs Potassium will be replenished Magnesium level will be checked Continue telemetry  Type 2  diabetes mellitus with hyperglycemia Continue Semglee 10 units and adjust dose accordingly Continue ISS and hypoglycemia protocol  Obesity-31.8 mcg/m Diet and lifestyle modification  Elevated troponin possibly secondary to type II demand ischemia Troponin x 2 - 61 > 66, chest discomfort has since improved Continue to trend troponin  Paroxysmal A-fib with RVR Patient was cardioverted yesterday, EKG reviewed showed sinus tachycardia at a rate of 107 bpm with PACs. Continue Eliquis Continue telemetry  GERD Continue Protonix  Essential hypertension Continue Cardizem  Mixed hyperlipidemia Continue Crestor  ESRD on HD (TTS) Last dialysis was today (5/23) Continue PhosLo Nephrologist will be consulted for maintenance dialysis while inpatient  NSTEMI s/p stent placement Continue Eliquis and Brilinta   DVT prophylaxis: Eliquis  Code Status: Full code  Consults: None  Family Communication: None at bedside  Severity of Illness: The appropriate patient status for this patient is INPATIENT. Inpatient status is judged to be reasonable and necessary in order to provide the required intensity of service to ensure the patient's safety. [name] presenting symptoms, physical exam findings, and initial radiographic and laboratory data in the context of their chronic comorbidities is felt to place them at high risk for further clinical deterioration. Furthermore, it is not anticipated that the patient will be medically stable for discharge from the hospital within 2 midnights of admission.   * I certify that at the point of admission it is my clinical judgment that the patient will require inpatient hospital care spanning beyond 2 midnights from the point of admission due to high intensity of service, high risk for further deterioration and high frequency of  surveillance required.*  Author: Bernadette Hoit, DO 04/26/2022 1:02 AM  For on call review [email or web address].

## 2022-04-25 NOTE — ED Provider Notes (Signed)
[facility] EMERGENCY DEPARTMENT Provider Note   CSN: [id number] Arrival date & time: 04/25/22  1332     History  Chief Complaint  Patient presents with   Atrial Fibrillation    [name] is a 71 y.o. female with history including type 2 diabetes, hypertension, CHF, CAD who was admitted in April where she was treated for non-STEMI with stenting x2, also history of end-stage renal disease who completed dialysis this morning but presents here secondary to shortness of breath and tachycardia.  She also has a history of atrial fibrillation and is on chronic Eliquis, actually underwent cardioversion of her atrial fibrillation yesterday in the care of Dr. Harrington Challenger and was sent home in normal sinus rhythm.  She noted as the evening progressed she has had increasing shortness of breath which is worse with any attempts at lying flat, although she states this is always the case and typically sleeps in an upright position.  She describes midsternal chest pressure in association with a rapid heart rate.  She denies chest pain, cough, nausea, vomiting, abdominal pain.  She denies any increased peripheral edema.  Of note, she also endorses having a fever yesterday evening of 100.2.  This is resolved today, she has not had any antipyretics today.  She is fully COVID vaccinated and booster.  The history is provided by the patient and medical records.   HPI: A 71 year old patient with a history of treated diabetes, hypertension and obesity presents for evaluation of chest pain. Initial onset of pain was more than 6 hours ago. [name] chest pain is described as heaviness/pressure/tightness and is not worse with exertion. [name] chest pain is middle- or left-sided, is not well-localized, is not sharp and does not radiate to the arms/jaw/neck. The patient does not complain of nausea and denies diaphoresis. The patient has no history of stroke, has no history of peripheral artery disease, has not smoked  in the past 90 days, has no relevant family history of coronary artery disease (first degree relative at less than age 54) and has no history of hypercholesterolemia.   Home Medications Prior to Admission medications   Medication Sig Start Date End Date Taking? Authorizing Provider  acetaminophen (TYLENOL) 500 MG tablet Take 1,000 mg by mouth every 6 (six) hours as needed for moderate pain or headache.   Yes [provider]  albuterol (PROVENTIL) (2.5 MG/3ML) 0.083% nebulizer solution Take 3 mLs (2.5 mg total) by nebulization every 6 (six) hours as needed for wheezing or shortness of breath. 12/02/21  Yes [name], MD  albuterol (VENTOLIN HFA) 108 (90 Base) MCG/ACT inhaler Inhale 2 puffs into the lungs every 6 (six) hours as needed for wheezing or shortness of breath. 12/02/21  Yes [name], MD  apixaban (ELIQUIS) 5 MG TABS tablet Take 1 tablet (5 mg total) by mouth 2 (two) times daily. 04/05/22  Yes Minus Breeding, MD  atenolol (TENORMIN) 100 MG tablet Take 1 tablet (100 mg total) by mouth at bedtime. 03/31/22 04/30/22 Yes Phillips Grout, MD  budesonide-formoterol [facility]) 160-4.5 MCG/ACT inhaler Inhale 2 puffs into the lungs 2 (two) times daily. 11/15/21  Yes [name], MD  calcium acetate (PHOSLO) 667 MG capsule Take 667-1,334 mg by mouth See admin instructions. Take 1334 mg with meals 3 times daily and 667 mg with snacks   Yes [provider]  Carboxymethylcellul-Glycerin (LUBRICATING EYE DROPS OP) Place 1 drop into both eyes daily as needed (dry eyes).   Yes [provider]  cetirizine (ZYRTEC) 10 MG tablet Take 1 tablet (10 mg total) by mouth daily as needed for allergies. Patient taking differently: Take 10 mg by mouth daily. 02/08/22  Yes [name], MD  diltiazem (CARDIZEM CD) 240 MG 24 hr capsule Take 1 capsule (240 mg total) by mouth daily. 04/12/22 05/12/22 Yes [name], [name], MD  gabapentin (NEURONTIN) 400 MG capsule Take 2 capsules (800 mg  total) by mouth See admin instructions. Take it after HD Patient taking differently: Take 800 mg by mouth See admin instructions. Take after dialysis 03/22/22  Yes [name], MD  hydrOXYzine (ATARAX) 50 MG tablet Take 1 tablet (50 mg total) by mouth 3 (three) times daily as needed for anxiety. 03/22/22  Yes [name], [name], MD  Insulin Glargine [facility] KWIKPEN) 100 UNIT/ML Inject 25 Units into the skin at bedtime. Add five units each time the fasting is over 125 three days in a row. 11/15/21  Yes [name], MD  lidocaine-prilocaine (EMLA) cream Apply 1 application. topically as needed (dialysis).   Yes [provider]  nitroGLYCERIN (NITROSTAT) 0.4 MG SL tablet Place 1 tablet (0.4 mg total) under the tongue every 5 (five) minutes as needed for chest pain. 11/09/21  Yes Minus Breeding, MD  omeprazole (PRILOSEC) 20 MG capsule Take 1 capsule (20 mg total) by mouth daily. 12/12/21  Yes [name], [name], MD  ondansetron (ZOFRAN) 4 MG tablet TAKE 1 TABLET BY MOUTH EVERY 8 HOURS AS NEEDED FOR NAUSEA AND VOMITING 04/07/22  Yes [name], [name], MD  polyethylene glycol (MIRALAX / GLYCOLAX) 17 g packet Take 17 g by mouth daily as needed for moderate constipation or mild constipation. 03/10/22  Yes Pokhrel, Laxman, MD  QUEtiapine (SEROQUEL) 200 MG tablet Take 1 tablet (200 mg total) by mouth at bedtime. For sleep and for anxiety 03/17/22  Yes [name], [name], MD  rOPINIRole (REQUIP) 1 MG tablet Take 1 tablet (1 mg total) by mouth at bedtime. For leg cramps 09/15/21  Yes [name], [name], MD  rosuvastatin (CRESTOR) 5 MG tablet Take 1 tablet (5 mg total) by mouth daily. For cholesterol Patient taking differently: Take 5 mg by mouth at bedtime. For cholesterol 12/12/21  Yes [name], [name], MD  Semaglutide,0.25 or 0.5MG /DOS, (OZEMPIC, 0.25 OR 0.5 MG/DOSE,) 2 MG/1.5ML SOPN Inject 0.5 mg into the skin every Friday.   Yes [provider]  ticagrelor (BRILINTA) 90 MG TABS tablet Take 1 tablet (90 mg total) by  mouth 2 (two) times daily. 04/05/22 06/04/22 Yes Minus Breeding, MD  torsemide (DEMADEX) 20 MG tablet Take 40-60 mg by mouth daily. 01/19/22  Yes [provider]  Continuous Blood Gluc Receiver (FREESTYLE LIBRE 2 READER) DEVI USE TO TEST BLOOD SUGAR CONTINUOUSLY 11/15/21   [name], MD  Continuous Blood Gluc Sensor (FREESTYLE LIBRE 2 SENSOR) MISC Use multiple times daily to track glucose to prevent highs and lows as a complication of dialysis Dx:Z99.2, E11.59 09/15/21   [name], MD  famotidine (PEPCID) 20 MG tablet Take 1 tablet (20 mg total) by mouth 2 (two) times daily. Patient not taking: Reported on 04/25/2022 02/15/22   Dettinger, Fransisca Kaufmann, MD  glucose blood (ONETOUCH ULTRA) test strip Use to test blood sugar 3 times daily as directed. DX: E11.9 09/13/20   [name], MD      Allergies    Azithromycin, Ace inhibitors, Clopidogrel bisulfate, Codeine, Lisinopril, and Penicillins    Review of Systems   Review of Systems  Constitutional:  Positive for fever.  HENT:  Negative for congestion and sore  throat.   Eyes: Negative.   Respiratory:  Positive for cough, chest tightness and shortness of breath.   Cardiovascular:  Positive for palpitations. Negative for chest pain and leg swelling.  Gastrointestinal:  Negative for abdominal pain, nausea and vomiting.  Genitourinary: Negative.   Musculoskeletal:  Negative for arthralgias, joint swelling and neck pain.  Skin: Negative.  Negative for rash and wound.  Neurological:  Negative for dizziness, weakness, light-headedness, numbness and headaches.  Psychiatric/Behavioral: Negative.    All other systems reviewed and are negative.  Physical Exam Updated Vital Signs BP (!) 151/71   Pulse 100   Temp (!) 100.5 F (38.1 C) (Rectal)   Resp (!) 28   SpO2 98%  Physical Exam Vitals and nursing note reviewed.  Constitutional:      Appearance: She is well-developed.  HENT:     Head: Normocephalic and atraumatic.  Eyes:      Conjunctiva/sclera: Conjunctivae normal.  Cardiovascular:     Rate and Rhythm: Regular rhythm. Tachycardia present.     Heart sounds: Normal heart sounds.     Comments: Sinus rhythm, borderline tachycardia Pulmonary:     Effort: Pulmonary effort is normal.     Breath sounds: Normal breath sounds. No wheezing.  Abdominal:     General: Bowel sounds are normal.     Palpations: Abdomen is soft.     Tenderness: There is no abdominal tenderness.  Musculoskeletal:        General: Normal range of motion.     Cervical back: Normal range of motion.  Skin:    General: Skin is warm and dry.  Neurological:     Mental Status: She is alert.    ED Results / Procedures / Treatments   Labs (all labs ordered are listed, but only abnormal results are displayed) Labs Reviewed  BASIC METABOLIC PANEL - Abnormal; Notable for the following components:      Result Value   Potassium 2.8 (*)    Chloride 93 (*)    Glucose, Bld 199 (*)    Creatinine, Ser 5.37 (*)    Calcium 8.6 (*)    GFR, Estimated 8 (*)    All other components within normal limits  CBC - Abnormal; Notable for the following components:   WBC 13.5 (*)    Hemoglobin 11.0 (*)    HCT 34.7 (*)    RDW 15.9 (*)    All other components within normal limits  URINALYSIS, ROUTINE W REFLEX MICROSCOPIC - Abnormal; Notable for the following components:   APPearance CLOUDY (*)    Glucose, UA 50 (*)    Hgb urine dipstick LARGE (*)    Protein, ur 100 (*)    Leukocytes,Ua TRACE (*)    RBC / HPF >50 (*)    Bacteria, UA FEW (*)    All other components within normal limits  TROPONIN I (HIGH SENSITIVITY) - Abnormal; Notable for the following components:   Troponin I (High Sensitivity) 61 (*)    All other components within normal limits  TROPONIN I (HIGH SENSITIVITY) - Abnormal; Notable for the following components:   Troponin I (High Sensitivity) 66 (*)    All other components within normal limits  RESP PANEL BY RT-PCR (FLU A&B, COVID) [id number]   URINE CULTURE  POTASSIUM    EKG EKG Interpretation  Date/Time:  Tuesday Apr 25 2022 13:45:46 EDT Ventricular Rate:  107 PR Interval:  150 QRS Duration: 86 QT Interval:  386 QTC Calculation: 515 R Axis:   107 Text  Interpretation: Sinus tachycardia with frequent Premature ventricular complexes Septal infarct (cited on or before 27-Mar-2022) Lateral infarct , age undetermined Abnormal ECG When compared with ECG of 24-Apr-2022 10:08, Premature ventricular complexes are now Present QRS axis Shifted right Confirmed by Aletta Edouard [phone]) on 04/25/2022 1:50:25 PM  Radiology DG Chest 2 View  Result Date: 04/25/2022 CLINICAL DATA:  AFib, palpitations, cardioversion yesterday EXAM: CHEST - 2 VIEW COMPARISON:  03/29/2022 FINDINGS: Cardiomegaly. Both lungs are clear. Disc degenerative disease of the thoracic spine. IMPRESSION: Cardiomegaly without acute abnormality of the lungs. Electronically Signed   By: Delanna Ahmadi M.D.   On: 04/25/2022 14:39    Procedures Procedures    Medications Ordered in ED Medications  apixaban (ELIQUIS) tablet 5 mg (has no administration in time range)  fosfomycin (MONUROL) packet 3 g (3 g Oral Given 04/25/22 2017)    ED Course/ Medical Decision Making/ A&P   HEAR Score: 5                       Medical Decision Making Patient with a history of an acute MI last month, followed by atrial fibrillation with cardioversion yesterday, but patient with complaints of worsening shortness of breath since her cardioversion procedure yesterday.  She is a dialysis patient additionally and did complete a full dialysis session today, stating she got to her dry weight with today's treatment.  No wheezing, no hypoxia but persistent or symptom of shortness of breath.  Although she is on Eliquis, there is concern for possible PE, her troponins are modestly elevated, 61 and 66 which appear to be this patient's baseline.  She denies chest pain.  No significant peripheral edema,  cough, she does have a low-grade fever which appears to be secondary to a UTI.  Urine culture has been sent.  Amount and/or Complexity of Data Reviewed Labs: ordered.    Details: Significant labs per above. Radiology: ordered.    Details: Chest x-ray is negative for pneumonia.  She does have cardiomegaly.  No lung abnormalities. Discussion of management or test interpretation with external provider(s): Discussed UTI with pharmacy who recommended a one-time dose of fosfomycin 3 g to treat her UTI.  This has been ordered.  Also discussed patient with nephrology service Dr. Royce Macadamia when we decided she would need to CT angio of her chest to rule out PE.  They did state that a contrasted study is fine for this patient.  They also recommended repeating her potassium level and if it remains low that she would recommend treatment with 20 mcg of potassium prior to discharge home.  Patient was signed out to Dr. Eulis Foster who assumes care.           Final Clinical Impression(s) / ED Diagnoses Final diagnoses:  None    Rx / DC Orders ED Discharge Orders     None         Landis Martins 04/25/22 2041    Daleen Bo, MD 04/26/22 [phone]

## 2022-04-25 NOTE — ED Notes (Signed)
Pt returned from X-ray.  

## 2022-04-25 NOTE — ED Notes (Signed)
Temp low pt stated she had been eating ice. Will recheck briefly

## 2022-04-26 DIAGNOSIS — N186 End stage renal disease: Secondary | ICD-10-CM | POA: Diagnosis not present

## 2022-04-26 DIAGNOSIS — R6520 Severe sepsis without septic shock: Secondary | ICD-10-CM

## 2022-04-26 DIAGNOSIS — J189 Pneumonia, unspecified organism: Secondary | ICD-10-CM | POA: Diagnosis not present

## 2022-04-26 DIAGNOSIS — A419 Sepsis, unspecified organism: Secondary | ICD-10-CM

## 2022-04-26 DIAGNOSIS — I480 Paroxysmal atrial fibrillation: Secondary | ICD-10-CM | POA: Diagnosis present

## 2022-04-26 DIAGNOSIS — E1121 Type 2 diabetes mellitus with diabetic nephropathy: Secondary | ICD-10-CM

## 2022-04-26 DIAGNOSIS — J9621 Acute and chronic respiratory failure with hypoxia: Secondary | ICD-10-CM

## 2022-04-26 DIAGNOSIS — J9601 Acute respiratory failure with hypoxia: Secondary | ICD-10-CM | POA: Diagnosis not present

## 2022-04-26 DIAGNOSIS — K219 Gastro-esophageal reflux disease without esophagitis: Secondary | ICD-10-CM

## 2022-04-26 DIAGNOSIS — R778 Other specified abnormalities of plasma proteins: Secondary | ICD-10-CM | POA: Diagnosis not present

## 2022-04-26 LAB — PROCALCITONIN: Procalcitonin: 0.31 ng/mL

## 2022-04-26 LAB — GLUCOSE, CAPILLARY
Glucose-Capillary: 143 mg/dL — ABNORMAL HIGH (ref 70–99)
Glucose-Capillary: 172 mg/dL — ABNORMAL HIGH (ref 70–99)
Glucose-Capillary: 178 mg/dL — ABNORMAL HIGH (ref 70–99)
Glucose-Capillary: 222 mg/dL — ABNORMAL HIGH (ref 70–99)

## 2022-04-26 LAB — COMPREHENSIVE METABOLIC PANEL
ALT: 17 U/L (ref 0–44)
AST: 14 U/L — ABNORMAL LOW (ref 15–41)
Albumin: 3.3 g/dL — ABNORMAL LOW (ref 3.5–5.0)
Alkaline Phosphatase: 72 U/L (ref 38–126)
Anion gap: 11 (ref 5–15)
BUN: 18 mg/dL (ref 8–23)
CO2: 28 mmol/L (ref 22–32)
Calcium: 8.5 mg/dL — ABNORMAL LOW (ref 8.9–10.3)
Chloride: 93 mmol/L — ABNORMAL LOW (ref 98–111)
Creatinine, Ser: 6.4 mg/dL — ABNORMAL HIGH (ref 0.44–1.00)
GFR, Estimated: 7 mL/min — ABNORMAL LOW (ref >60)
Glucose, Bld: 193 mg/dL — ABNORMAL HIGH (ref 70–99)
Potassium: 3.5 mmol/L (ref 3.5–5.1)
Sodium: 132 mmol/L — ABNORMAL LOW (ref 135–145)
Total Bilirubin: 0.5 mg/dL (ref 0.3–1.2)
Total Protein: 6.7 g/dL (ref 6.5–8.1)

## 2022-04-26 LAB — CBC
HCT: 32.1 % — ABNORMAL LOW (ref 36.0–46.0)
Hemoglobin: 10.2 g/dL — ABNORMAL LOW (ref 12.0–15.0)
MCH: 28 pg (ref 26.0–34.0)
MCHC: 31.8 g/dL (ref 30.0–36.0)
MCV: 88.2 fL (ref 80.0–100.0)
Platelets: 306 10*3/uL (ref 150–400)
RBC: 3.64 MIL/uL — ABNORMAL LOW (ref 3.87–5.11)
RDW: 15.8 % — ABNORMAL HIGH (ref 11.5–15.5)
WBC: 15.1 10*3/uL — ABNORMAL HIGH (ref 4.0–10.5)
nRBC: 0 % (ref 0.0–0.2)

## 2022-04-26 LAB — LACTIC ACID, PLASMA
Lactic Acid, Venous: 1 mmol/L (ref 0.5–1.9)
Lactic Acid, Venous: 1.3 mmol/L (ref 0.5–1.9)

## 2022-04-26 LAB — PHOSPHORUS: Phosphorus: 4.5 mg/dL (ref 2.5–4.6)

## 2022-04-26 LAB — MAGNESIUM: Magnesium: 1.9 mg/dL (ref 1.7–2.4)

## 2022-04-26 LAB — TROPONIN I (HIGH SENSITIVITY)
Troponin I (High Sensitivity): 61 ng/L — ABNORMAL HIGH (ref <18)
Troponin I (High Sensitivity): 60 ng/L — ABNORMAL HIGH (ref <18)

## 2022-04-26 MED ORDER — CALCIUM ACETATE (PHOS BINDER) 667 MG PO CAPS: 667.0000 mg | ORAL_CAPSULE | ORAL | Status: DC | PRN

## 2022-04-26 MED ORDER — INSULIN ASPART 100 UNIT/ML IJ SOLN
0.0000 [IU] | Freq: Three times a day (TID) | INTRAMUSCULAR | Status: DC
  Administered 2022-04-26 – 2022-04-27 (×3): 1 [IU] via SUBCUTANEOUS

## 2022-04-26 MED ORDER — ROSUVASTATIN CALCIUM 10 MG PO TABS
5.0000 mg | ORAL_TABLET | Freq: Every day | ORAL | Status: DC
  Administered 2022-04-26 – 2022-04-28 (×3): 5 mg via ORAL
  Filled 2022-04-26 (×3): qty 1

## 2022-04-26 MED ORDER — DM-GUAIFENESIN ER 30-600 MG PO TB12
1.0000 | ORAL_TABLET | Freq: Two times a day (BID) | ORAL | Status: DC
  Administered 2022-04-26 – 2022-04-28 (×6): 1 via ORAL
  Filled 2022-04-26 (×6): qty 1

## 2022-04-26 MED ORDER — DILTIAZEM HCL ER COATED BEADS 120 MG PO CP24
240.0000 mg | ORAL_CAPSULE | Freq: Every day | ORAL | Status: DC
  Administered 2022-04-26 – 2022-04-28 (×3): 240 mg via ORAL
  Filled 2022-04-26 (×3): qty 2

## 2022-04-26 MED ORDER — PROCHLORPERAZINE MALEATE 5 MG PO TABS
10.0000 mg | ORAL_TABLET | Freq: Four times a day (QID) | ORAL | Status: DC | PRN
  Administered 2022-04-26 – 2022-04-28 (×4): 10 mg via ORAL
  Filled 2022-04-26 (×4): qty 2

## 2022-04-26 MED ORDER — CALCIUM ACETATE (PHOS BINDER) 667 MG PO CAPS
1334.0000 mg | ORAL_CAPSULE | Freq: Three times a day (TID) | ORAL | Status: DC
  Administered 2022-04-26 – 2022-04-28 (×5): 1334 mg via ORAL
  Filled 2022-04-26 (×7): qty 2

## 2022-04-26 MED ORDER — SODIUM CHLORIDE 0.9 % IV SOLN
100.0000 mg | Freq: Two times a day (BID) | INTRAVENOUS | Status: DC
  Administered 2022-04-26 – 2022-04-28 (×6): 100 mg via INTRAVENOUS
  Filled 2022-04-26 (×9): qty 100

## 2022-04-26 MED ORDER — HYDROXYZINE HCL 25 MG PO TABS
50.0000 mg | ORAL_TABLET | Freq: Three times a day (TID) | ORAL | Status: DC | PRN
  Administered 2022-04-26 – 2022-04-27 (×2): 50 mg via ORAL
  Filled 2022-04-26 (×2): qty 2

## 2022-04-26 MED ORDER — PANTOPRAZOLE SODIUM 40 MG PO TBEC
40.0000 mg | DELAYED_RELEASE_TABLET | Freq: Every day | ORAL | Status: DC
  Administered 2022-04-26 – 2022-04-28 (×3): 40 mg via ORAL
  Filled 2022-04-26 (×3): qty 1

## 2022-04-26 MED ORDER — SODIUM CHLORIDE 0.9 % IV SOLN
2.0000 g | Freq: Every day | INTRAVENOUS | Status: DC
  Administered 2022-04-26 – 2022-04-27 (×3): 2 g via INTRAVENOUS
  Filled 2022-04-26 (×3): qty 20

## 2022-04-26 MED ORDER — APIXABAN 5 MG PO TABS
5.0000 mg | ORAL_TABLET | Freq: Two times a day (BID) | ORAL | Status: DC
  Administered 2022-04-26 – 2022-04-28 (×5): 5 mg via ORAL
  Filled 2022-04-26 (×6): qty 1

## 2022-04-26 MED ORDER — CHLORHEXIDINE GLUCONATE CLOTH 2 % EX PADS
6.0000 | MEDICATED_PAD | Freq: Every day | CUTANEOUS | Status: DC
  Administered 2022-04-26 – 2022-04-28 (×3): 6 via TOPICAL

## 2022-04-26 MED ORDER — INSULIN ASPART 100 UNIT/ML IJ SOLN
0.0000 [IU] | Freq: Every day | INTRAMUSCULAR | Status: DC
  Administered 2022-04-26 (×2): 2 [IU] via SUBCUTANEOUS

## 2022-04-26 MED ORDER — INSULIN GLARGINE-YFGN 100 UNIT/ML ~~LOC~~ SOLN
10.0000 [IU] | Freq: Every day | SUBCUTANEOUS | Status: DC
  Administered 2022-04-26 – 2022-04-27 (×2): 10 [IU] via SUBCUTANEOUS
  Filled 2022-04-26 (×3): qty 0.1

## 2022-04-26 MED ORDER — ACETAMINOPHEN 325 MG PO TABS
650.0000 mg | ORAL_TABLET | Freq: Four times a day (QID) | ORAL | Status: DC | PRN
  Administered 2022-04-26: 650 mg via ORAL
  Filled 2022-04-26: qty 2

## 2022-04-26 MED ORDER — INSULIN ASPART 100 UNIT/ML IJ SOLN
2.0000 [IU] | Freq: Three times a day (TID) | INTRAMUSCULAR | Status: DC
  Administered 2022-04-26 – 2022-04-27 (×3): 2 [IU] via SUBCUTANEOUS

## 2022-04-26 MED ORDER — TICAGRELOR 90 MG PO TABS
90.0000 mg | ORAL_TABLET | Freq: Two times a day (BID) | ORAL | Status: DC
  Administered 2022-04-26 – 2022-04-28 (×6): 90 mg via ORAL
  Filled 2022-04-26 (×6): qty 1

## 2022-04-26 MED ORDER — QUETIAPINE FUMARATE 100 MG PO TABS
200.0000 mg | ORAL_TABLET | Freq: Every day | ORAL | Status: DC
  Administered 2022-04-26 – 2022-04-27 (×2): 200 mg via ORAL
  Filled 2022-04-26 (×2): qty 2

## 2022-04-26 NOTE — Consult Note (Signed)
[facility] Renal Consultation Note    Indication for Consultation:  Management of ESRD/hemodialysis; anemia, hypertension/volume and secondary hyperparathyroidism  HPI: [name] is a 71 y.o. female with PMH including ESRD on dialysis, CHF, CAD(s/p PCI of LAD and RCA on 03/06/22), COPD, T2DM with neuopathy, PVD, HTN, CVA, and new onset atrial fibrillation (hospitalized 4/1-4/12/23) who presented to [facility] ED due to worsening SOB and palpitations.  Pt underwent DCCV of her atrial fib on 04/24/22 and went back to sinus rhythm.  In the ED she was febrile at 100.5, tachypneic, tachycardic and labs were notable for WBC 15.1, Hgb 10.2, K 3.5.  CXR with NAD, CT angio without PE but showed multifocal pneumonia.  She was admitted for IV abx and we were consulted to provide dialysis during her hospitalization.  She completed dialysis on 04/25/22.  Past Medical History:  Diagnosis Date   Acute on chronic diastolic CHF (congestive heart failure) ([place]) 03/05/2015   Grade 2. EF 60-65%   Anemia    hx of   Anxiety    Arthritis    CAD (coronary artery disease)    s/p Stenting in 2005;  [place] 7/09: Vigorous LV function, 2-3+ MR, RI 40%, RI stent patent, proximal-mid RCA 40-50%;  E[place] 7/09:  EF 55-65%, trivial MR;  [place] 11/18/12: EF 66%, normal LV wall motion, mild to moderate anterior ischemia - reviewed by [facility] and felt to rep breast atten (low risk);  Echo 6/14: mild LVH, EF 55-65%, Gr 2 DD, PASP 44, trivial eff    Chronic kidney disease    CREATININE IS UP--GOING TO KIDNEY MD    Chronic neck pain    COPD (chronic obstructive pulmonary disease) (HCC)    Degenerative joint disease    left shoulder   Diabetic neuropathy (HCC)    Diverticulosis    Esophageal stricture    GERD (gastroesophageal reflux disease)    Hyperlipidemia    Hypertension    IDDM (insulin dependent diabetes mellitus)    Pericardial effusion 03/06/2015   Very small per echo ; pleural nodules seen on CT chest.    Peripheral vascular disease ([place])    Stroke ([place])    "mini- years ago"   Vitamin D deficiency    Past Surgical History:  Procedure Laterality Date   A/V FISTULAGRAM N/A 08/10/2017   Procedure: A/V Fistulagram - Right Arm;  Surgeon: Elam Dutch, MD;  Location: [place] [facility];  Service: Cardiovascular;  Laterality: N/A;   A/V FISTULAGRAM N/A 03/03/2020   Procedure: A/V FISTULAGRAM - Right Arm;  Surgeon: Marty Heck, MD;  Location: [place] [facility];  Service: Cardiovascular;  Laterality: N/A;   ABDOMINAL HYSTERECTOMY     AV FISTULA PLACEMENT Right 07/20/2015   Procedure: RADIAL CEPHALIC ARTERIOVENOUS FISTULA CREATION RIGHT ARM;  Surgeon: Angelia Mould, MD;  Location: [place];  Service: Vascular;  Laterality: Right;   AV FISTULA PLACEMENT Right 11/26/2015   Procedure:  Creation of RIGHT BRACHIO-CEPHALIC arteriovenous fistula;  Surgeon: Angelia Mould, MD;  Location: [facility] OR;  Service: Vascular;  Laterality: Right;   BACK SURGERY     2007 & 2013   [place]  2003, 2005, 2009   1 stent   cardiac stents     CARDIOVERSION N/A 04/24/2022   Procedure: CARDIOVERSION;  Surgeon: Fay Records, MD;  Location: [place];  Service: Cardiovascular;  Laterality: N/A;   CATARACT EXTRACTION W/PHACO Right 05/04/2014  Procedure: CATARACT EXTRACTION PHACO AND INTRAOCULAR LENS PLACEMENT (IOC);  Surgeon: Williams Che, MD;  Location: [facility];  Service: Ophthalmology;  Laterality: Right;  CDE 1.47   CATARACT EXTRACTION W/PHACO Left 07/20/2014   Procedure: CATARACT EXTRACTION WITH PHACO AND INTRAOCULAR LENS PLACEMENT LEFT EYE CDE=1.79;  Surgeon: Williams Che, MD;  Location: [facility];  Service: Ophthalmology;  Laterality: Left;   CERVICAL SPINE SURGERY  2012   8 screws   CORONARY ATHERECTOMY N/A 03/08/2022   Procedure: CORONARY ATHERECTOMY;  Surgeon: Sherren Mocha, MD;  Location: [place] [facility];  Service: Cardiovascular;  Laterality:  N/A;   CYSTOSCOPY WITH INJECTION N/A 06/03/2013   Procedure: MACROPLASTIQUE  INJECTION ;  Surgeon: Malka So, MD;  Location: [facility];  Service: Urology;  Laterality: N/A;   FISTULOGRAM Right 10/20/2016   Procedure: FISTULOGRAM WITH POSSIBLE INTERVENTION;  Surgeon: [name], MD;  Location: [facility];  Service: Vascular;  Laterality: Right;   INSERTION OF DIALYSIS CATHETER N/A 11/26/2015   Procedure: INSERTION OF DIALYSIS CATHETER;  Surgeon: Angelia Mould, MD;  Location: [place];  Service: Vascular;  Laterality: N/A;   INTRAVASCULAR IMAGING/OCT N/A 03/08/2022   Procedure: INTRAVASCULAR IMAGING/OCT;  Surgeon: Sherren Mocha, MD;  Location: [place] [facility];  Service: Cardiovascular;  Laterality: N/A;   LAMINECTOMY  12/29/2011   LEFT HEART CATH AND CORONARY ANGIOGRAPHY N/A 03/06/2022   Procedure: LEFT HEART CATH AND CORONARY ANGIOGRAPHY;  Surgeon: Nelva Bush, MD;  Location: [place] [facility];  Service: Cardiovascular;  Laterality: N/A;   LUMBAR LAMINECTOMY/DECOMPRESSION MICRODISCECTOMY  12/29/2011   Procedure: LUMBAR LAMINECTOMY/DECOMPRESSION MICRODISCECTOMY;  Surgeon: Floyce Stakes, MD;  Location: [place] [facility];  Service: Neurosurgery;  Laterality: N/A;  Lumbar Two through Lumbar Five Laminectomies/Cellsaver   PARTIAL HYSTERECTOMY     PERIPHERAL VASCULAR BALLOON ANGIOPLASTY  03/03/2020   Procedure: PERIPHERAL VASCULAR BALLOON ANGIOPLASTY;  Surgeon: Marty Heck, MD;  Location: [place] [facility];  Service: Cardiovascular;;  Rt arm fistula   PERIPHERAL VASCULAR BALLOON ANGIOPLASTY Right 04/15/2021   Procedure: PERIPHERAL VASCULAR BALLOON ANGIOPLASTY;  Surgeon: Angelia Mould, MD;  Location: [place] [facility];  Service: Cardiovascular;  Laterality: Right;  arm fistula   PERIPHERAL VASCULAR BALLOON ANGIOPLASTY  03/14/2022   Procedure: PERIPHERAL VASCULAR BALLOON ANGIOPLASTY;  Surgeon: Broadus John, MD;  Location: [place] [facility];  Service: Cardiovascular;;  right  arm AVF   PERIPHERAL VASCULAR CATHETERIZATION N/A 11/22/2015   Procedure: Fistulagram;  Surgeon: Angelia Mould, MD;  Location: [place] [facility];  Service: Cardiovascular;  Laterality: N/A;   PERIPHERAL VASCULAR INTERVENTION  08/10/2017   Procedure: PERIPHERAL VASCULAR INTERVENTION;  Surgeon: Elam Dutch, MD;  Location: [place] [facility];  Service: Cardiovascular;;   SHOULDER SURGERY Right    Rotator cuff   Family History:   Family History  Problem Relation Age of Onset   Heart disease Other    Hypertension Father    Cancer Father    Hypertension Mother    Hypertension Daughter    Pancreatitis Brother    Hypertension Daughter    Diabetes Daughter    Social History:  reports that she quit smoking about 20 years ago. Her smoking use included cigarettes. She has a 30.00 pack-year smoking history. She has never used smokeless tobacco. She reports that she does not drink alcohol and does not use drugs. Allergies  Allergen Reactions   Azithromycin     Prolonged QT   Ace Inhibitors Cough   Clopidogrel Bisulfate  Other (See Comments)    Sick, Headache, "Felt terrible"   Codeine Other (See Comments)    hallucinations   Lisinopril Cough   Penicillins Other (See Comments)    Unknown childhood reaction Did it involve swelling of the face/tongue/throat, SOB, or low BP? Unknown Did it involve sudden or severe rash/hives, skin peeling, or any reaction on the inside of your mouth or nose? Unknown Did you need to seek medical attention at a hospital or doctor's office? Unknown When did it last happen?      childhood allergy If all above answers are "NO", may proceed with cephalosporin use.     Prior to Admission medications   Medication Sig Start Date End Date Taking? Authorizing Provider  acetaminophen (TYLENOL) 500 MG tablet Take 1,000 mg by mouth every 6 (six) hours as needed for moderate pain or headache.   Yes [provider]  albuterol (PROVENTIL) (2.5 MG/3ML)  0.083% nebulizer solution Take 3 mLs (2.5 mg total) by nebulization every 6 (six) hours as needed for wheezing or shortness of breath. 12/02/21  Yes [name], MD  albuterol (VENTOLIN HFA) 108 (90 Base) MCG/ACT inhaler Inhale 2 puffs into the lungs every 6 (six) hours as needed for wheezing or shortness of breath. 12/02/21  Yes [name], MD  apixaban (ELIQUIS) 5 MG TABS tablet Take 1 tablet (5 mg total) by mouth 2 (two) times daily. 04/05/22  Yes Minus Breeding, MD  atenolol (TENORMIN) 100 MG tablet Take 1 tablet (100 mg total) by mouth at bedtime. 03/31/22 04/30/22 Yes Phillips Grout, MD  budesonide-formoterol [facility]) 160-4.5 MCG/ACT inhaler Inhale 2 puffs into the lungs 2 (two) times daily. 11/15/21  Yes [name], MD  calcium acetate (PHOSLO) 667 MG capsule Take 667-1,334 mg by mouth See admin instructions. Take 1334 mg with meals 3 times daily and 667 mg with snacks   Yes [provider]  Carboxymethylcellul-Glycerin (LUBRICATING EYE DROPS OP) Place 1 drop into both eyes daily as needed (dry eyes).   Yes [provider]  cetirizine (ZYRTEC) 10 MG tablet Take 1 tablet (10 mg total) by mouth daily as needed for allergies. Baker taking differently: Take 10 mg by mouth daily. 02/08/22  Yes [name], MD  diltiazem (CARDIZEM CD) 240 MG 24 hr capsule Take 1 capsule (240 mg total) by mouth daily. 04/12/22 05/12/22 Yes [name], [name], MD  gabapentin (NEURONTIN) 400 MG capsule Take 2 capsules (800 mg total) by mouth See admin instructions. Take it after HD Baker taking differently: Take 800 mg by mouth See admin instructions. Take after dialysis 03/22/22  Yes [name], MD  hydrOXYzine (ATARAX) 50 MG tablet Take 1 tablet (50 mg total) by mouth 3 (three) times daily as needed for anxiety. 03/22/22  Yes [name], [name], MD  Insulin Glargine [facility] KWIKPEN) 100 UNIT/ML Inject 25 Units into the skin at bedtime. Add five units each time the fasting is over 125 three  days in a row. 11/15/21  Yes [name], MD  lidocaine-prilocaine (EMLA) cream Apply 1 application. topically as needed (dialysis).   Yes [provider]  nitroGLYCERIN (NITROSTAT) 0.4 MG SL tablet Place 1 tablet (0.4 mg total) under the tongue every 5 (five) minutes as needed for chest pain. 11/09/21  Yes Minus Breeding, MD  omeprazole (PRILOSEC) 20 MG capsule Take 1 capsule (20 mg total) by mouth daily. 12/12/21  Yes [name], [name], MD  ondansetron (ZOFRAN) 4 MG tablet TAKE 1 TABLET BY MOUTH EVERY 8 HOURS AS NEEDED FOR NAUSEA AND  VOMITING 04/07/22  Yes [name], [name], MD  polyethylene glycol (MIRALAX / GLYCOLAX) 17 g packet Take 17 g by mouth daily as needed for moderate constipation or mild constipation. 03/10/22  Yes Pokhrel, Laxman, MD  QUEtiapine (SEROQUEL) 200 MG tablet Take 1 tablet (200 mg total) by mouth at bedtime. For sleep and for anxiety 03/17/22  Yes [name], [name], MD  rOPINIRole (REQUIP) 1 MG tablet Take 1 tablet (1 mg total) by mouth at bedtime. For leg cramps 09/15/21  Yes [name], [name], MD  rosuvastatin (CRESTOR) 5 MG tablet Take 1 tablet (5 mg total) by mouth daily. For cholesterol Baker taking differently: Take 5 mg by mouth at bedtime. For cholesterol 12/12/21  Yes [name], [name], MD  Semaglutide,0.25 or 0.5MG /DOS, (OZEMPIC, 0.25 OR 0.5 MG/DOSE,) 2 MG/1.5ML SOPN Inject 0.5 mg into the skin every Friday.   Yes [provider]  ticagrelor (BRILINTA) 90 MG TABS tablet Take 1 tablet (90 mg total) by mouth 2 (two) times daily. 04/05/22 06/04/22 Yes Minus Breeding, MD  torsemide (DEMADEX) 20 MG tablet Take 40-60 mg by mouth daily. 01/19/22  Yes [provider]  Continuous Blood Gluc Receiver (FREESTYLE LIBRE 2 READER) DEVI USE TO TEST BLOOD SUGAR CONTINUOUSLY 11/15/21   [name], MD  Continuous Blood Gluc Sensor (FREESTYLE LIBRE 2 SENSOR) MISC Use multiple times daily to track glucose to prevent highs and lows as a complication of dialysis Dx:Z99.2, E11.59  09/15/21   [name], MD  famotidine (PEPCID) 20 MG tablet Take 1 tablet (20 mg total) by mouth 2 (two) times daily. Baker not taking: Reported on 04/25/2022 02/15/22   Dettinger, Fransisca Kaufmann, MD  glucose blood (ONETOUCH ULTRA) test strip Use to test blood sugar 3 times daily as directed. DX: E11.9 09/13/20   [name], MD   Current Facility-Administered Medications  Medication Dose Route Frequency Provider Last Rate Last Admin   acetaminophen (TYLENOL) tablet 650 mg  650 mg Oral Q6H PRN Adefeso, Oladapo, DO   650 mg at 04/26/22 0160   apixaban (ELIQUIS) tablet 5 mg  5 mg Oral BID Adefeso, Oladapo, DO   5 mg at 04/26/22 1002   calcium acetate (PHOSLO) capsule 1,334 mg  1,334 mg Oral TID WC Adefeso, Oladapo, DO   1,334 mg at 04/26/22 0831   calcium acetate (PHOSLO) capsule 667 mg  667 mg Oral PRN Adefeso, Oladapo, DO       cefTRIAXone (ROCEPHIN) 2 g in sodium chloride 0.9 % 100 mL IVPB  2 g Intravenous QHS Adefeso, Oladapo, DO 200 mL/hr at 04/26/22 0141 2 g at 04/26/22 0141   Chlorhexidine Gluconate Cloth 2 % PADS 6 each  6 each Topical Q0600 Donato Heinz, MD       dextromethorphan-guaiFENesin [facility] DM) 30-600 MG per 12 hr tablet 1 tablet  1 tablet Oral BID Adefeso, Oladapo, DO   1 tablet at 04/26/22 1002   diltiazem (CARDIZEM CD) 24 hr capsule 240 mg  240 mg Oral Daily Adefeso, Oladapo, DO   240 mg at 04/26/22 1002   doxycycline (VIBRAMYCIN) 100 mg in sodium chloride 0.9 % 250 mL IVPB  100 mg Intravenous BID Adefeso, Oladapo, DO 125 mL/hr at 04/26/22 0218 100 mg at 04/26/22 0218   insulin aspart (novoLOG) injection 0-5 Units  0-5 Units Subcutaneous QHS Adefeso, Oladapo, DO   2 Units at 04/26/22 0147   insulin aspart (novoLOG) injection 0-6 Units  0-6 Units Subcutaneous TID WC Adefeso, Oladapo, DO       insulin aspart (novoLOG) injection 2  Units  2 Units Subcutaneous TID WC Adefeso, Oladapo, DO   2 Units at 04/26/22 0830   insulin glargine-yfgn (SEMGLEE) injection 10 Units  10 Units  Subcutaneous QHS Adefeso, Oladapo, DO       pantoprazole (PROTONIX) EC tablet 40 mg  40 mg Oral Daily Adefeso, Oladapo, DO   40 mg at 04/26/22 1002   prochlorperazine (COMPAZINE) tablet 10 mg  10 mg Oral Q6H PRN Adefeso, Oladapo, DO   10 mg at 04/26/22 1025   rosuvastatin (CRESTOR) tablet 5 mg  5 mg Oral Daily Adefeso, Oladapo, DO   5 mg at 04/26/22 1002   ticagrelor (BRILINTA) tablet 90 mg  90 mg Oral BID Adefeso, Oladapo, DO   90 mg at 04/26/22 1002   Labs: Basic Metabolic Panel: Recent Labs  Lab 04/19/22 1456 04/25/22 1345 04/25/22 2052 04/26/22 0023  NA 137 135  --  132*  K 3.5 2.8* 3.1* 3.5  CL 93* 93*  --  93*  CO2 28 31  --  28  GLUCOSE 143* 199*  --  193*  BUN 24 13  --  18  CREATININE 7.65* 5.37*  --  6.40*  CALCIUM 9.6 8.6*  --  8.5*  PHOS  --   --   --  4.5   Liver Function Tests: Recent Labs  Lab 04/26/22 0023  AST 14*  ALT 17  ALKPHOS 72  BILITOT 0.5  PROT 6.7  ALBUMIN 3.3*   No results for input(s): LIPASE, AMYLASE in the last 168 hours. No results for input(s): AMMONIA in the last 168 hours. CBC: Recent Labs  Lab 04/19/22 1456 04/25/22 1345 04/26/22 0023  WBC 9.3 13.5* 15.1*  HGB 10.0* 11.0* 10.2*  HCT 31.9* 34.7* 32.1*  MCV 89 87.6 88.2  PLT 300 342 306   Cardiac Enzymes: No results for input(s): CKTOTAL, CKMB, CKMBINDEX, TROPONINI in the last 168 hours. CBG: Recent Labs  Lab 04/24/22 0918 04/26/22 0734  GLUCAP 154* 143*   Iron Studies: No results for input(s): IRON, TIBC, TRANSFERRIN, FERRITIN in the last 72 hours. Studies/Results: DG Chest 2 View  Result Date: 04/25/2022 CLINICAL DATA:  AFib, palpitations, cardioversion yesterday EXAM: CHEST - 2 VIEW COMPARISON:  03/29/2022 FINDINGS: Cardiomegaly. Both lungs are clear. Disc degenerative disease of the thoracic spine. IMPRESSION: Cardiomegaly without acute abnormality of the lungs. Electronically Signed   By: Delanna Ahmadi M.D.   On: 04/25/2022 14:39   CT Angio Chest PE W and/or Wo  Contrast  Result Date: 04/25/2022 CLINICAL DATA:  Pulmonary embolus suspected with high probability. Cardioversion yesterday. Heart is racing with shortness of breath. Atrial fibrillation. EXAM: CT ANGIOGRAPHY CHEST WITH CONTRAST TECHNIQUE: Multidetector CT imaging of the chest was performed using the standard protocol during bolus administration of intravenous contrast. Multiplanar CT image reconstructions and MIPs were obtained to evaluate the vascular anatomy. RADIATION DOSE REDUCTION: This exam was performed according to the departmental dose-optimization program which includes automated exposure control, adjustment of the mA and/or kV according to Baker size and/or use of iterative reconstruction technique. CONTRAST:  133mL OMNIPAQUE IOHEXOL 350 MG/ML SOLN COMPARISON:  02/02/2021 FINDINGS: Cardiovascular: Good opacification of the central and segmental pulmonary arteries. No focal filling defects. No evidence of significant pulmonary embolus. Diffuse cardiac enlargement. No pericardial effusions. Normal caliber thoracic aorta. Calcification of the aorta and coronary arteries. Mediastinum/Nodes: Esophagus is decompressed. Mediastinal lymph nodes are prominent, similar to prior study. Likely reactive. Thyroid gland is unremarkable. Lungs/Pleura: Patchy nodular infiltrates demonstrated throughout both lungs, new since prior  study, most likely representing multifocal pneumonia. Consider COVID pneumonia. Metastatic disease would be a less likely consideration. No pleural effusions. No pneumothorax. Upper Abdomen: No acute abnormality. Musculoskeletal: No chest wall abnormality. No acute or significant osseous findings. Review of the MIP images confirms the above findings. IMPRESSION: 1. No evidence of significant pulmonary embolus. 2. Patchy nodular infiltrates demonstrated throughout both lungs most likely representing multifocal pneumonia. Electronically Signed   By: Lucienne Capers M.D.   On: 04/25/2022  21:36    ROS: Pertinent items are noted in HPI. Physical Exam: Vitals:   04/26/22 0206 04/26/22 0603 04/26/22 0715 04/26/22 1002  BP: (!) 178/70 (!) 123/56  (!) 160/69  Pulse: (!) 101 98 96 90  Resp: (!) 24 (!) 24  (!) 24  Temp: 98.1 F (36.7 C) 100.3 F (37.9 C) 99.4 F (37.4 C) 98.2 F (36.8 C)  TempSrc: Oral Oral Oral Oral  SpO2: 100% 100% 100% 100%      Weight change:  No intake or output data in the 24 hours ending 04/26/22 1023 BP (!) 160/69 (BP Location: Left Arm)   Pulse 90   Temp 98.2 F (36.8 C) (Oral)   Resp (!) 24   SpO2 100%  General appearance: fatigued and no distress Resp: clear to auscultation bilaterally Cardio: regular rate and rhythm, S1, S2 normal, no murmur, click, rub or gallop GI: soft, non-tender; bowel sounds normal; no masses,  no organomegaly Extremities: extremities normal, atraumatic, no cyanosis or edema and RUE AVF +T/B Dialysis Access:  Dialysis Orders: Center: [facility]  on TTS . EDW 82 kg HD Bath 1K/2.5Ca  Time 4 hours Heparin 1000 units bolus then 1000 unts/hr. Access RUE AVF BFR 350 DFR 500    Calcitriol 2.25 mcg po/HD Micera 50 mcg IVP every 2 weeks   Assessment/Plan:  Multifocal Pneumonia - started on Abx per primary  Acute respiratory failure with hypoxia - improving.  ESRD -  plan for HD tomorrow to keep on TTS schedule  Hypertension/volume  - stable  Anemia  - stable  Metabolic bone disease -  continue with home meds  Nutrition - renal diet, carb modified  Paroxysmal atrial fibrillation - s/p cardioversion 04/24/22 now in NSR.   Donetta Potts, MD Rafael Gonzalez 04/26/2022, 10:23 AM

## 2022-04-26 NOTE — Assessment & Plan Note (Addendum)
-  Continue current dose of Cardizem -Continue Eliquis for secondary prevention -Rate controlled and in sinus rhythm and currently. -Continue outpatient follow-up with cardiology service.

## 2022-04-26 NOTE — Assessment & Plan Note (Addendum)
-  Stable overall -Continue current antihypertensive agents. -Recommending to follow-up blood pressure and further adjust antihypertensive agents as needed. -Heart healthy/low-sodium diet discussed with patient.

## 2022-04-26 NOTE — Assessment & Plan Note (Addendum)
-  Repleted -Continue to follow electrolytes trend and further adjust with hemodialysis treatment per nephrology discretion.

## 2022-04-26 NOTE — Assessment & Plan Note (Signed)
-  BMI 31.89 -Low calorie diet, portion control and increase physical activity discussed with patient.

## 2022-04-26 NOTE — Assessment & Plan Note (Addendum)
-  Patient met sepsis criteria at time of admission with elevated WBCs, tachypnea, fever and tachycardia. -Source of infection identified as multifocal pneumonia based on CT images. -Lactic acid within normal limit -Cultures remain without specific growth; sepsis features resolved at discharge.

## 2022-04-26 NOTE — Progress Notes (Signed)
   04/25/22 2352  Assess: MEWS Score  Temp 98 F (36.7 C)  BP (!) 165/73  Pulse Rate (!) 104  Resp (!) 25  SpO2 100 %  O2 Device Nasal Cannula  O2 Flow Rate (L/min) 2 L/min  Assess: MEWS Score  MEWS Temp 0  MEWS Systolic 0  MEWS Pulse 1  MEWS RR 1  MEWS LOC 0  MEWS Score 2  MEWS Score Color Yellow  Assess: if the MEWS score is Yellow or Red  Were vital signs taken at a resting state? Yes  Focused Assessment No change from prior assessment  Early Detection of Sepsis Score *See Row Information* Low  MEWS guidelines implemented *See Row Information* Yes  Treat  MEWS Interventions Escalated (See documentation below)  Take Vital Signs  Increase Vital Sign Frequency  Yellow: Q 2hr X 2 then Q 4hr X 2, if remains yellow, continue Q 4hrs  Escalate  MEWS: Escalate Yellow: discuss with charge nurse/RN and consider discussing with provider and RRT  Notify: Charge Nurse/RN  Name of Charge Nurse/RN Notified Ileana Roup, RN  Date Charge Nurse/RN Notified 04/26/22  Time Charge Nurse/RN Notified 2350   Patient MEWs protocol continued upon admission.

## 2022-04-26 NOTE — Assessment & Plan Note (Addendum)
-  Continue to minimize/avoid the use of medications that can prolong QT -Close follow up of electrolytes and maintain stability as much as possible (potassium above 4 and magnesium more than 2)

## 2022-04-26 NOTE — Assessment & Plan Note (Addendum)
-  Complete antibiotics using oral Augmentin as prescribed. -Patient afebrile, speaking in full sentences and no requiring oxygen supplementation at time of discharge. -Continue the use of flutter valve and bronchodilator management. -Cultures remains negative. -CT angiogram demonstrated no pulmonary embolism and confirmed the presence of multifocal pneumonia. -Continue supportive care and maintain adequate hydration.

## 2022-04-26 NOTE — Assessment & Plan Note (Addendum)
-  In the setting of multifocal pneumonia -At time of discharge patient demonstrated good oxygen saturation without supplementation at rest and with exertion. -Continue current antibiotic therapy to complete management of her pneumonia infection.

## 2022-04-26 NOTE — Progress Notes (Signed)
Pt has been up in recliner since 0730 this am, pt states too SOB to lay flat in bed. Pt ate breakfast and has been sleeping rest of am. Very drowsy each time I enter room, difficult for pt to stay awake during conversation. Pt states, "Girl, I am just so sleepy. I ain't had no rest." Pt will wake up and follow commands, oriented x4. Took oral meds earlier with no difficulty. IV abx infusing without difficulty. Pt denies any c/o. Call bell in lap, advised to call for needs, states understanding.

## 2022-04-26 NOTE — Assessment & Plan Note (Addendum)
-  Modified carbohydrate diet discussed with patient. -Still on home hypoglycemic regimen -Continue to follow CBG/A1c with further adjustment to current management as required.

## 2022-04-26 NOTE — Assessment & Plan Note (Addendum)
-  Continue the use of Crestor.

## 2022-04-26 NOTE — Assessment & Plan Note (Addendum)
-  Flat elevation in the setting of demand ischemia due to severe sepsis secondary to multifocal pneumonia. -Patient denies chest pain -continue chronic use of brillinta  -Continue outpatient follow-up with cardiology service.

## 2022-04-26 NOTE — Assessment & Plan Note (Addendum)
-  Nephrology service consulted for continuation of hemodialysis while inpatient. -Last treatment 04/27/2022, well-tolerated. -At discharge patient will resume on prior to admission outpatient schedule. -Next dialysis treatment 04/29/2022.

## 2022-04-26 NOTE — Assessment & Plan Note (Addendum)
Continue PPI ?

## 2022-04-26 NOTE — Progress Notes (Signed)
Glucometer not recognizing patient's MRN. CBG obtained and results 219, Elray Mcgregor Verified.

## 2022-04-26 NOTE — TOC Initial Note (Signed)
Transition of Care [facility]) - Initial/Assessment Note    Patient Details  Name: [name] MRN: [id number] Date of Birth: October 01, 1951  Transition [facility]) CM/SW Contact:    Salome Arnt, [place] Phone Number: 04/26/2022, 8:30 AM  Clinical Narrative:  Pt admitted for multifocal pneumonia. Assessment completed due to high risk readmission score. Pt reports she lives with her mother and is independent with ADLs. She is active with [facility]. Cory with [facility] notified of admission. Pt plans to return home when medically stable. No needs reported at this time. TOC will continue to follow.                   Expected Discharge Plan: [place] Barriers to Discharge: Continued Medical Work up   Patient Goals and CMS Choice Patient states their goals for this hospitalization and ongoing recovery are:: return home   Choice offered to / list presented to : Patient  Expected Discharge Plan and Services Expected Discharge Plan: [place] In-house Referral: Clinical Social Work   Post Acute Care Choice: Resumption of Svcs/PTA Provider Living arrangements for the past 2 months: Single Family Home                           HH Arranged: PT [place]: [place] Date [facility] Agency Contacted: 04/26/22 Time HH Agency Contacted: [phone] Representative spoke with at [place]: Tommi Rumps  Prior Living Arrangements/Services Living arrangements for the past 2 months: [place] Lives with:: Parents Patient language and need for interpreter reviewed:: Yes Do you feel safe going back to the place where you live?: Yes      Need for Family Participation in Patient Care: No (Comment)   Current home services: Home PT Criminal Activity/Legal Involvement Pertinent to Current Situation/Hospitalization: No - Comment as needed  Activities of Daily Living Home Assistive Devices/Equipment: None ADL Screening (condition at time of  admission) Patient's cognitive ability adequate to safely complete daily activities?: Yes Is the patient deaf or have difficulty hearing?: No Does the patient have difficulty seeing, even when wearing glasses/contacts?: No Does the patient have difficulty concentrating, remembering, or making decisions?: No Patient able to express need for assistance with ADLs?: Yes Does the patient have difficulty dressing or bathing?: No Independently performs ADLs?: Yes (appropriate for developmental age) Does the patient have difficulty walking or climbing stairs?: No Weakness of Legs: None Weakness of Arms/Hands: None  Permission Sought/Granted                  Emotional Assessment     Affect (typically observed): Appropriate Orientation: : Oriented to Self, Oriented to Place, Oriented to  Time, Oriented to Situation Alcohol / Substance Use: Not Applicable Psych Involvement: No (comment)  Admission diagnosis:  End stage renal disease ([place]) [N18.6] Hypokalemia [E87.6] Multifocal pneumonia [J18.9] Dyspnea, unspecified type [R06.00] Community acquired pneumonia, unspecified laterality [J18.9] Patient Active Problem List   Diagnosis Date Noted   Acute respiratory failure with hypoxia ([place]) 04/26/2022   Sepsis ([place]) 04/26/2022   Multifocal pneumonia 04/25/2022   Dyslipidemia 04/04/2022   Non-ST elevation (NSTEMI) myocardial infarction ([place]) 04/04/2022   Acute exacerbation of Diastolic CHF (congestive heart failure) ([place]) 03/26/2022   Physical deconditioning 03/09/2022   Hyponatremia 03/09/2022   Pulmonary hypertension ([place]) 03/06/2022   Hypokalemia 03/05/2022   Atrial fibrillation with RVR ([place]) 03/04/2022   Change in bowel habit 12/16/2021  Colon cancer screening 12/16/2021   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   Family history of colonic polyps 12/16/2021   Acute CHF (congestive heart failure) ([place]) 12/06/2021   Leukocytosis 12/06/2021   Elevated brain natriuretic peptide  (BNP) level 12/06/2021   Elevated troponin 12/06/2021   GERD (gastroesophageal reflux disease) 12/06/2021   Nausea 08/26/2021   Lumbago of lumbar region with sciatica 07/21/2021   Arthropathy of lumbar facet joint 06/27/2021   Radiculopathy, lumbar region 05/05/2021   Chest pain 02/19/2021   Essential hypertension    Mixed hyperlipidemia    Type 2 diabetes mellitus with hyperglycemia ([place])    Hypoalbuminemia    Bruit 01/05/2020   Educated about COVID-19 virus infection 01/05/2020   End stage renal disease on dialysis due to type 2 diabetes mellitus ([place]) 07/21/2019   Lung nodule 05/01/2018   Prolonged Q-T interval on ECG 09/06/2017   Unstable angina (HCC) 09/06/2017   GAD (generalized anxiety disorder) 03/04/2016   Leg pain 01/07/2016   Anemia of chronic disease/ESRD 12/10/2015   Dependence on renal dialysis ([place]) 12/10/2015   ESRD (end stage renal disease) on dialysis ([place]) 12/10/2015   Osteopathy in diseases classified elsewhere, unspecified site 12/10/2015   Constipation 10/25/2015   COPD (chronic obstructive pulmonary disease) ([place]) 03/06/2015   Pericardial effusion 03/06/2015   Chronic diastolic CHF (congestive heart failure) ([place]) 03/05/2015   PAD (peripheral artery disease) ([place]) 04/21/2014   Dyspnea 09/29/2013   Obesity (BMI 30.0-34.9) 12/05/2011   Diverticulosis 03/29/2011   Diabetic neuropathy ([place]) 03/29/2011   Esophageal stricture 03/29/2011   Vitamin D deficiency 03/29/2011   Degenerative joint disease of left shoulder 03/29/2011   Neck pain 03/29/2011   Type II diabetes mellitus with nephropathy ([place]) 04/19/2009   Hyperlipidemia associated with type 2 diabetes mellitus ([place]) 04/19/2009   Hypertension associated with diabetes ([place]) 04/19/2009   Coronary artery disease of native artery of native heart with stable angina pectoris ([place]) 04/19/2009   PCP:  [name], MD Pharmacy:   [facility] [address] [place]  [place] [address] Phone: [phone] Fax: [phone]     Social Determinants of Health (SDOH) Interventions    Readmission Risk Interventions    04/26/2022    8:27 AM 03/27/2022    4:22 PM 03/08/2022    1:58 PM  Readmission Risk Prevention Plan  Transportation Screening Complete Complete Complete  Medication Review Press photographer) Complete Complete Complete  PCP or Specialist appointment within 3-5 days of discharge   Complete  HRI or Home Care Consult Complete Complete Complete  SW Recovery Care/Counseling Consult Complete Complete Complete  Palliative Care Screening Not Applicable Not Applicable Not Applicable  Skilled Nursing Facility Not Applicable Not Applicable Complete

## 2022-04-26 NOTE — Progress Notes (Signed)
Progress Note   Patient: [name] DOB: Jan 06, 1951 DOA: 04/25/2022     1 DOS: the patient was seen and examined on 04/26/2022   Brief hospital course: As per H&P written by Dr. Josephine Cables on 04/25/2022 [name] is a 71 y.o. female with medical history significant of hypertension, type 2 diabetes mellitus, ESRD on HD (TTS), paroxysmal A-fib, COPD, GERD, CAD s/p stent placement who presents to the emergency department due to worsening shortness of breath and palpitations that has been ongoing since yesterday.  Patient has a history of atrial fibrillation and she was cardioverted yesterday by Dr. Harrington Challenger after which she went back to sinus rhythm.  Patient complained of worsening shortness of breath in the evening, along with palpitation and temperature of 100.66F which self resolved this morning.  This was associated with nonradiating, nonreproducible midsternal chest tightness.  This morning, she went to dialysis and she was placed on oxygen during dialysis, however, she was asked to go to the ED after the dialysis for further work-up and management.   ED Course:  In the emergency department, she was tachypneic, tachycardic, febrile (100.66F).  BP was 160/61.  Work-up in the ED showed leukocytosis and normocytic anemia.  BMP showed hypokalemia, BUN to creatinine 13/1.37, CBG 199, troponin x2 - 61 > 66.  Urinalysis was unimpressive for UTI.  Influenza A, B, SARS coronavirus 2 was negative. Chest x-ray showed cardiomegaly without acute abnormality of the lungs CT angiography chest with contrast showed no evidence of significant pulmonary embolus, but showed patchy nodular infiltrates demonstrated throughout both lungs most likely representing multifocal pneumonia. She was initially started on fosfomycin due to presumed UTI, but after CT angiogram of chest showed multifocal pneumonia, IV Levaquin was indicated.  Hospitalist was asked to admit patient for further evaluation and  management.  Assessment and Plan: * Multifocal pneumonia - Continue current IV antibiotic -Continue the use of flutter valve, bronchodilators and wean off oxygen supplementation as tolerated -Follow culture results and continue as needed antipyretics. -CT angiogram demonstrated no pulmonary embolism and confirmed the presence of multifocal pneumonia.  Type II diabetes mellitus with nephropathy (HCC) - Continue sliding scale insulin and Semglee -Follow CBGs and adjust hypoglycemic regimen as needed. -Modified carbohydrate diet discussed with patient.  PAF (paroxysmal atrial fibrillation) (HCC) - Continue current dose of Cardizem -Continue Eliquis for secondary prevention -Continue telemetry monitoring -Rate controlled currently.  Sepsis ([place]) - Patient met sepsis criteria at time of admission with elevated WBCs, tachypnea, fever and tachycardia. -Source of infection identified as multifocal pneumonia based on CT images. -Continue current IV antibiotic -Lactic acid within normal limit -Follow clinical response and culture results.  Acute respiratory failure with hypoxia (HCC) - In the setting of multifocal pneumonia -Continue to wean oxygen supplementation as tolerated. -Continue current antibiotic therapy.  Hypokalemia - Repleted -Further electrolytes stability to be follow through hemodialysis.  GERD (gastroesophageal reflux disease) - Continue PPI.  Elevated troponin - Flat elevation in the setting of demand ischemia due to severe sepsis secondary to multifocal pneumonia. -Patient denies chest pain -Continue telemetry monitoring -continue chronic brillinta    Mixed hyperlipidemia - Continue the use of Crestor.  Essential hypertension - Stable overall -Continue current antihypertensive agents.   Obesity (BMI 30.0-34.9) -BMI 31.89 -Low calorie diet, portion control and increase physical activity discussed with patient.  ESRD (end stage renal disease) on  dialysis [facility]) - Nephrology service has been consulted and will follow their recommendation -Patient normal schedule Tuesday-Thursday and Saturday; last hemodialysis  on 04/25/2022.   Prolonged Q-T interval on ECG - Minimize/avoid the use of medications that can prolong QT -Continue telemetry monitoring -Follow electrolytes and maintain stability as much as possible (potassium above 4 and magnesium more than 2)    Subjective:  Fatigue, tired and deconditioned.  Expressing feeling short winded with activity.  Nasal cannula supplementation in place (2 L); low-grade temperature overnight.  Physical Exam: Vitals:   04/26/22 0715 04/26/22 1002 04/26/22 1308 04/26/22 1600  BP:  (!) 160/69 137/60   Pulse: 96 90 92 100  Resp:  (!) '24 18 20  ' Temp: 99.4 F (37.4 C) 98.2 F (36.8 C) 98.1 F (36.7 C) 98.4 F (36.9 C)  TempSrc: Oral Oral  Oral  SpO2: 100% 100% 99% 100%   General exam: Alert, awake, oriented x 3; reports he feels sleepy, tired and weak.  No chest pain, no nausea or vomiting. Respiratory system: Positive rhonchi appreciated bilaterally; no using accessory muscles.  No wheezing on exam. Cardiovascular system:RRR. No murmurs, rubs, gallops.  No JVD. Gastrointestinal system: Abdomen is obese, nondistended, soft and nontender. No organomegaly or masses felt. Normal bowel sounds heard. Central nervous system: Alert and oriented. No focal neurological deficits. Extremities: No cyanosis or clubbing; right upper extremity AV fistula with positive thrill and bruit. Skin: No petechiae. Psychiatry: Judgement and insight appear normal. Mood & affect appropriate.   Data Reviewed: CBC demonstrating WBCs of 15.1, hemoglobin 10.2 and platelet count 306K Magnesium 1.9 Phosphorus 4.5 Troponin flat elevation of 61 Lactic acid 1.3  Family Communication: No family at bedside.  Disposition: Status is: Inpatient Remains inpatient appropriate because: Patient with pneumonia, acute  respiratory failure with hypoxia and the need of IV antibiotics.  Low-grade fever appreciated.   Planned Discharge Destination: Home  Author: Barton Dubois, MD 04/26/2022 6:53 PM  For on call review [email or web address].

## 2022-04-27 ENCOUNTER — Ambulatory Visit: Payer: Medicare Other | Admitting: Cardiology

## 2022-04-27 DIAGNOSIS — J189 Pneumonia, unspecified organism: Secondary | ICD-10-CM | POA: Diagnosis not present

## 2022-04-27 DIAGNOSIS — J9601 Acute respiratory failure with hypoxia: Secondary | ICD-10-CM | POA: Diagnosis not present

## 2022-04-27 DIAGNOSIS — N186 End stage renal disease: Secondary | ICD-10-CM | POA: Diagnosis not present

## 2022-04-27 DIAGNOSIS — R778 Other specified abnormalities of plasma proteins: Secondary | ICD-10-CM | POA: Diagnosis not present

## 2022-04-27 LAB — CBC
HCT: 28.1 % — ABNORMAL LOW (ref 36.0–46.0)
Hemoglobin: 9.1 g/dL — ABNORMAL LOW (ref 12.0–15.0)
MCH: 28 pg (ref 26.0–34.0)
MCHC: 32.4 g/dL (ref 30.0–36.0)
MCV: 86.5 fL (ref 80.0–100.0)
Platelets: 278 10*3/uL (ref 150–400)
RBC: 3.25 MIL/uL — ABNORMAL LOW (ref 3.87–5.11)
RDW: 15.9 % — ABNORMAL HIGH (ref 11.5–15.5)
WBC: 13.7 10*3/uL — ABNORMAL HIGH (ref 4.0–10.5)
nRBC: 0 % (ref 0.0–0.2)

## 2022-04-27 LAB — RENAL FUNCTION PANEL
Albumin: 2.8 g/dL — ABNORMAL LOW (ref 3.5–5.0)
Anion gap: 12 (ref 5–15)
BUN: 35 mg/dL — ABNORMAL HIGH (ref 8–23)
CO2: 24 mmol/L (ref 22–32)
Calcium: 8.9 mg/dL (ref 8.9–10.3)
Chloride: 93 mmol/L — ABNORMAL LOW (ref 98–111)
Creatinine, Ser: 9.61 mg/dL — ABNORMAL HIGH (ref 0.44–1.00)
GFR, Estimated: 4 mL/min — ABNORMAL LOW (ref >60)
Glucose, Bld: 157 mg/dL — ABNORMAL HIGH (ref 70–99)
Phosphorus: 4.9 mg/dL — ABNORMAL HIGH (ref 2.5–4.6)
Potassium: 3.4 mmol/L — ABNORMAL LOW (ref 3.5–5.1)
Sodium: 129 mmol/L — ABNORMAL LOW (ref 135–145)

## 2022-04-27 LAB — URINE CULTURE: Culture: <10000 — AB

## 2022-04-27 LAB — HEPATITIS B SURFACE ANTIBODY,QUALITATIVE: Hep B S Ab: REACTIVE — AB

## 2022-04-27 LAB — GLUCOSE, CAPILLARY
Glucose-Capillary: 181 mg/dL — ABNORMAL HIGH (ref 70–99)
Glucose-Capillary: 176 mg/dL — ABNORMAL HIGH (ref 70–99)
Glucose-Capillary: 109 mg/dL — ABNORMAL HIGH (ref 70–99)

## 2022-04-27 LAB — HEPATITIS B SURFACE ANTIGEN: Hepatitis B Surface Ag: NONREACTIVE

## 2022-04-27 MED ORDER — ALTEPLASE 2 MG IJ SOLR: 2.0000 mg | Freq: Once | INTRAMUSCULAR | Status: DC | PRN

## 2022-04-27 MED ORDER — HEPARIN SODIUM (PORCINE) 1000 UNIT/ML DIALYSIS: 1000.0000 [IU] | INTRAMUSCULAR | Status: DC | PRN

## 2022-04-27 MED ORDER — SODIUM CHLORIDE 0.9 % IV SOLN: 100.0000 mL | INTRAVENOUS | Status: DC | PRN

## 2022-04-27 MED ORDER — LIDOCAINE HCL (PF) 1 % IJ SOLN: 5.0000 mL | INTRAMUSCULAR | Status: DC | PRN

## 2022-04-27 MED ORDER — HEPARIN SODIUM (PORCINE) 1000 UNIT/ML DIALYSIS: 20.0000 [IU]/kg | INTRAMUSCULAR | Status: DC | PRN

## 2022-04-27 MED ORDER — LIDOCAINE-PRILOCAINE 2.5-2.5 % EX CREA: 1.0000 "application " | TOPICAL_CREAM | CUTANEOUS | Status: DC | PRN

## 2022-04-27 MED ORDER — PENTAFLUOROPROP-TETRAFLUOROETH EX AERO
1.0000 "application " | INHALATION_SPRAY | CUTANEOUS | Status: DC | PRN
  Filled 2022-04-27: qty 30

## 2022-04-27 NOTE — Consult Note (Signed)
[facility] Inpatient Consult   04/27/2022  MEAGEN LIMONES 11/15/1951 [id number]  [facility] CM)   Patient chart reviewed with noted extreme high risk scores for unplanned readmission and unplanned 30 day readmission. Patient was screened for embedded practice service needs for chronic care management and is active with the [facility] CM embedded RNCM and embedded Pharmacist for chronic care coordination at primary provider office.   Plan: Will continue to follow for progression and disposition plans.   Of note, [facility] services does not replace or interfere with any services that are arranged by inpatient case management or social work.   Netta Cedars, MSN, RN [facility] Liaison Toll free office [phone]

## 2022-04-27 NOTE — Progress Notes (Signed)
[facility] Progress Note    Assessment/ Plan:   Assessment/Plan:  Multifocal Pneumonia - Abx per primary  Acute respiratory failure with hypoxia - improving.  ESRD - will keep on TTS schedule, HD today  Hypertension/volume  - stable  Anemia  - stable  Metabolic bone disease -  continue with home meds  Nutrition - renal diet, carb modified  Paroxysmal atrial fibrillation - s/p cardioversion 04/24/22 now in NSR.   Outpatient Dialysis Orders: Center: [facility]  on TTS . EDW 82 kg HD Bath 1K/2.5Ca  Time 4 hours Heparin 1000 units bolus then 1000 unts/hr. Access RUE AVF BFR 350 DFR 500    Calcitriol 2.25 mcg po/HD Micera 50 mcg IVP every 2 weeks   Subjective:   Patient seen and examined this AM. No complaints. She reports that she feels like her breathing is slightly better today   Objective:   BP (!) 144/74 (BP Location: Left Arm)   Pulse 92   Temp 98 F (36.7 C)   Resp 19   Wt 83.4 kg   SpO2 98%   BMI 32.58 kg/m   Intake/Output Summary (Last 24 hours) at 04/27/2022 8341 Last data filed at 04/27/2022 0300 Gross per 24 hour  Intake 1500 ml  Output --  Net 1500 ml   Weight change:   Physical Exam: Gen:nad CVS:s1s2, rrr Resp:cta bl DQQ:IWLNL, soft, nt Ext:no edema Neuro: awake, alert Dialysis access: RUE AVF +b/t  Imaging: DG Chest 2 View  Result Date: 04/25/2022 CLINICAL DATA:  AFib, palpitations, cardioversion yesterday EXAM: CHEST - 2 VIEW COMPARISON:  03/29/2022 FINDINGS: Cardiomegaly. Both lungs are clear. Disc degenerative disease of the thoracic spine. IMPRESSION: Cardiomegaly without acute abnormality of the lungs. Electronically Signed   By: Delanna Ahmadi M.D.   On: 04/25/2022 14:39   CT Angio Chest PE W and/or Wo Contrast  Result Date: 04/25/2022 CLINICAL DATA:  Pulmonary embolus suspected with high probability. Cardioversion yesterday. Heart is racing with shortness of breath. Atrial fibrillation. EXAM: CT ANGIOGRAPHY CHEST WITH CONTRAST  TECHNIQUE: Multidetector CT imaging of the chest was performed using the standard protocol during bolus administration of intravenous contrast. Multiplanar CT image reconstructions and MIPs were obtained to evaluate the vascular anatomy. RADIATION DOSE REDUCTION: This exam was performed according to the departmental dose-optimization program which includes automated exposure control, adjustment of the mA and/or kV according to patient size and/or use of iterative reconstruction technique. CONTRAST:  133mL OMNIPAQUE IOHEXOL 350 MG/ML SOLN COMPARISON:  02/02/2021 FINDINGS: Cardiovascular: Good opacification of the central and segmental pulmonary arteries. No focal filling defects. No evidence of significant pulmonary embolus. Diffuse cardiac enlargement. No pericardial effusions. Normal caliber thoracic aorta. Calcification of the aorta and coronary arteries. Mediastinum/Nodes: Esophagus is decompressed. Mediastinal lymph nodes are prominent, similar to prior study. Likely reactive. Thyroid gland is unremarkable. Lungs/Pleura: Patchy nodular infiltrates demonstrated throughout both lungs, new since prior study, most likely representing multifocal pneumonia. Consider COVID pneumonia. Metastatic disease would be a less likely consideration. No pleural effusions. No pneumothorax. Upper Abdomen: No acute abnormality. Musculoskeletal: No chest wall abnormality. No acute or significant osseous findings. Review of the MIP images confirms the above findings. IMPRESSION: 1. No evidence of significant pulmonary embolus. 2. Patchy nodular infiltrates demonstrated throughout both lungs most likely representing multifocal pneumonia. Electronically Signed   By: Lucienne Capers M.D.   On: 04/25/2022 21:36    Labs: BMET Recent Labs  Lab 04/25/22 1345 04/25/22 2052 04/26/22 0023  NA 135  --  132*  K 2.8* 3.1* 3.5  CL 93*  --  93*  CO2 31  --  28  GLUCOSE 199*  --  193*  BUN 13  --  18  CREATININE 5.37*  --  6.40*   CALCIUM 8.6*  --  8.5*  PHOS  --   --  4.5   CBC Recent Labs  Lab 04/25/22 1345 04/26/22 0023  WBC 13.5* 15.1*  HGB 11.0* 10.2*  HCT 34.7* 32.1*  MCV 87.6 88.2  PLT 342 306    Medications:     apixaban  5 mg Oral BID   calcium acetate  1,334 mg Oral TID WC   Chlorhexidine Gluconate Cloth  6 each Topical Q0600   dextromethorphan-guaiFENesin  1 tablet Oral BID   diltiazem  240 mg Oral Daily   insulin aspart  0-5 Units Subcutaneous QHS   insulin aspart  0-6 Units Subcutaneous TID WC   insulin aspart  2 Units Subcutaneous TID WC   insulin glargine-yfgn  10 Units Subcutaneous QHS   pantoprazole  40 mg Oral Daily   QUEtiapine  200 mg Oral QHS   rosuvastatin  5 mg Oral Daily   ticagrelor  90 mg Oral BID      Gean Quint, MD [facility] 04/27/2022, 8:12 AM

## 2022-04-27 NOTE — Progress Notes (Addendum)
Progress Note   Patient: [name] DOB: Jul 27, 1951 DOA: 04/25/2022     2 DOS: the patient was seen and examined on 04/27/2022   Brief hospital course: As per H&P written by Dr. Josephine Cables on 04/25/2022 [name] is a 71 y.o. female with medical history significant of hypertension, type 2 diabetes mellitus, ESRD on HD (TTS), paroxysmal A-fib, COPD, GERD, CAD s/p stent placement who presents to the emergency department due to worsening shortness of breath and palpitations that has been ongoing since yesterday.  Patient has a history of atrial fibrillation and she was cardioverted yesterday by Dr. Harrington Challenger after which she went back to sinus rhythm.  Patient complained of worsening shortness of breath in the evening, along with palpitation and temperature of 100.475F which self resolved this morning.  This was associated with nonradiating, nonreproducible midsternal chest tightness.  This morning, she went to dialysis and she was placed on oxygen during dialysis, however, she was asked to go to the ED after the dialysis for further work-up and management.   ED Course:  In the emergency department, she was tachypneic, tachycardic, febrile (100.75F).  BP was 160/61.  Work-up in the ED showed leukocytosis and normocytic anemia.  BMP showed hypokalemia, BUN to creatinine 13/1.37, CBG 199, troponin x2 - 61 > 66.  Urinalysis was unimpressive for UTI.  Influenza A, B, SARS coronavirus 2 was negative. Chest x-ray showed cardiomegaly without acute abnormality of the lungs CT angiography chest with contrast showed no evidence of significant pulmonary embolus, but showed patchy nodular infiltrates demonstrated throughout both lungs most likely representing multifocal pneumonia. She was initially started on fosfomycin due to presumed UTI, but after CT angiogram of chest showed multifocal pneumonia, IV Levaquin was indicated.  Hospitalist was asked to admit patient for further evaluation and  management.  Assessment and Plan: * Multifocal pneumonia -Continue current IV antibiotic -Continue the use of flutter valve and bronchodilator management. -Follow culture results and continue as needed antipyretics. -CT angiogram demonstrated no pulmonary embolism and confirmed the presence of multifocal pneumonia. -Continue supportive care. -Continue to wean off oxygen supplementation as tolerated.  Type II diabetes mellitus with nephropathy (HCC) - Continue sliding scale insulin and Semglee -Follow CBGs and adjust hypoglycemic regimen as needed. -Modified carbohydrate diet discussed with patient.  PAF (paroxysmal atrial fibrillation) (HCC) - Continue current dose of Cardizem -Continue Eliquis for secondary prevention -Continue telemetry monitoring -Rate controlled currently.  Sepsis ([place]) - Patient met sepsis criteria at time of admission with elevated WBCs, tachypnea, fever and tachycardia. -Source of infection identified as multifocal pneumonia based on CT images. -Continue current IV antibiotic -Lactic acid within normal limit -Follow clinical response and culture results.  Acute respiratory failure with hypoxia (HCC) - In the setting of multifocal pneumonia -Continue to wean oxygen supplementation as tolerated. -Continue current antibiotic therapy.  Hypokalemia - Repleted -Further electrolytes stability to be follow through hemodialysis.  GERD (gastroesophageal reflux disease) - Continue PPI.  Elevated troponin - Flat elevation in the setting of demand ischemia due to severe sepsis secondary to multifocal pneumonia. -Patient denies chest pain -Continue telemetry monitoring -continue chronic brillinta    Mixed hyperlipidemia - Continue the use of Crestor.  Essential hypertension - Stable overall -Continue current antihypertensive agents.   Obesity (BMI 30.0-34.9) -BMI 31.89 -Low calorie diet, portion control and increase physical activity discussed with  patient.  ESRD (end stage renal disease) on dialysis [facility]) - Nephrology service has been consulted and will follow their recommendations. -Patient normal schedule  Tuesday-Thursday and Saturday; next hemodialysis treatment 04/27/2022.   Prolonged Q-T interval on ECG - Minimize/avoid the use of medications that can prolong QT -Continue telemetry monitoring -Follow electrolytes and maintain stability as much as possible (potassium above 4 and magnesium more than 2)    Subjective:  No overnight events; reports still short winded and experiencing intermittent coughing spells, but overall improving.  Patient is afebrile and requiring only 1 L nasal cannula supplementation with good saturation.  Physical Exam: Vitals:   04/27/22 1522 04/27/22 1710 04/27/22 1730 04/27/22 1800  BP: (!) 144/64 (!) 153/71 (!) 157/69 (!) 169/78  Pulse: 92 89 87 87  Resp: (!) 21 (!) 22    Temp:  97.9 F (36.6 C)    TempSrc:  Oral    SpO2: 95%     Weight:  83.5 kg     General exam: Alert, awake, oriented x 3; reporting improvement in her breathing and down to just 1 L nasal cannula supplementation.  No fever.  Reports no nausea or vomiting.  Still having intermittent coughing spells and feeling short winded with exertion but improving. Respiratory system: Positive rhonchi bilaterally; fair air movement.  No using accessory muscles. Cardiovascular system: Rate controlled, no rubs, no gallops; unable to assess JVD with body habitus. Gastrointestinal system: Abdomen is obese, nondistended, soft and nontender. No organomegaly or masses felt. Normal bowel sounds heard. Central nervous system: Alert and oriented. No focal neurological deficits. Extremities: No cyanosis or clubbing; right upper extremity AV fistula with positive thrill and bruit. Skin: No petechiae. Psychiatry: Judgement and insight appear normal. Mood & affect appropriate.   Data Reviewed: CBC demonstrating WBCs WBC 13.7, hemoglobin 9.1 and  platelet count 278 K Renal function panel pending.   Family Communication: No family at bedside.  Disposition: Status is: Inpatient Remains inpatient appropriate because: Patient with pneumonia, acute respiratory failure with hypoxia and the need of IV antibiotics.  Low-grade fever appreciated.   Planned Discharge Destination: Home  Author: Barton Dubois, MD 04/27/2022 6:45 PM  For on call review [email or web address].

## 2022-04-27 NOTE — Progress Notes (Signed)
Called to room by patient, states, "I can't breathe! Ain't no air going in my lungs. You gotta turn that oxygen on!" No change in breath sounds since this am, resp rate 24/min and shallow. SaO2 95% on room air. Advised pt to use pursed-lipped breathing to slow resp rate and improve aeration. Pt remains talkative during encounter. Pt's resp began to slow and SaO2 improved to 96-98%. Pt advised that we will be transporting her down for dialysis, pt able to stand and get into bed with only standby assist. Pt visibly winded with exertion, SaO2 91%. Nasal cannula placed an pt and O2 initiated at 1 lpm. Pt states relief of feeling SOB, resp rate 21/min, SaO2 95%. Pt transported to second floor dialysis room via stretcher by staff.

## 2022-04-27 NOTE — Progress Notes (Signed)
SATURATION QUALIFICATIONS: (This note is used to comply with regulatory documentation for home oxygen)  Patient Saturations on Room Air at Rest = 95%  Patient Saturations on Room Air while Ambulating = 90%  Patient Saturations on 1 Liters of oxygen while Ambulating = 98%  Please briefly explain why patient needs home oxygen: increased SOB with exertion, Hx CKD, on HD  At end of walk test as we entered her room, pt had episode of severe SOB with pt stating, "I can't breathe, I can't get no air!". Pt assisted to sit in chair, assumed orthopneic position, resp 24-26/min, SaO2 89-90%, HR 96. Instructed pt on pursed-lipped breathing and after 5 minutes pt states SOB relieved and pt able to sit upright in chair. Resp rate down to 18/min, HR down to 85, NSR per monitor. B/p 144/64. SaO2 94% on room air. Breath sounds continue clear upper lobes and fine crackles in lower lobes. No coughing. Pt has used both flutter valve and IS today with good effort.

## 2022-04-28 DIAGNOSIS — Z794 Long term (current) use of insulin: Secondary | ICD-10-CM

## 2022-04-28 DIAGNOSIS — E1165 Type 2 diabetes mellitus with hyperglycemia: Secondary | ICD-10-CM

## 2022-04-28 LAB — GLUCOSE, CAPILLARY
Glucose-Capillary: 90 mg/dL (ref 70–99)
Glucose-Capillary: 117 mg/dL — ABNORMAL HIGH (ref 70–99)

## 2022-04-28 LAB — HEPATITIS B SURFACE ANTIBODY, QUANTITATIVE: Hep B S AB Quant (Post): >1000 m[IU]/mL (ref >9.9)

## 2022-04-28 MED ORDER — PANTOPRAZOLE SODIUM 40 MG PO TBEC: 40.0000 mg | DELAYED_RELEASE_TABLET | Freq: Two times a day (BID) | ORAL | 2 refills | Status: DC

## 2022-04-28 MED ORDER — PANTOPRAZOLE SODIUM 40 MG PO TBEC: 40.0000 mg | DELAYED_RELEASE_TABLET | Freq: Two times a day (BID) | ORAL | Status: DC

## 2022-04-28 MED ORDER — AMOXICILLIN-POT CLAVULANATE 500-125 MG PO TABS: 1.0000 | ORAL_TABLET | Freq: Every day | ORAL | 0 refills | Status: AC

## 2022-04-28 MED ORDER — DM-GUAIFENESIN ER 30-600 MG PO TB12: 1.0000 | ORAL_TABLET | Freq: Two times a day (BID) | ORAL | 0 refills | Status: DC

## 2022-04-28 NOTE — Progress Notes (Signed)
  [facility] Progress Note    Assessment/ Plan:     Multifocal Pneumonia - Abx per primary  Acute respiratory failure with hypoxia - improving.  ESRD - will keep on TTS schedule, Last HD 5/25 with 2.2L net UF. Plan on next HD tomorrow on her outpt regimen.   Hypertension/volume  - stable  Anemia  - stable  Metabolic bone disease -  continue with home meds  Nutrition - renal diet, carb modified  Paroxysmal atrial fibrillation - s/p cardioversion 04/24/22 now in NSR.   Will not be physically seeing the patient this weekend unless there are unforeseen changes and will monitor remotely.  Outpatient Dialysis Orders: Center: [place]  on TTS . EDW 82 kg HD Bath 1K/2.5Ca  Time 4 hours Heparin 1000 units bolus then 1000 unts/hr. Access RUE AVF BFR 350 DFR 500    Calcitriol 2.25 mcg po/HD Micera 50 mcg IVP every 2 weeks   Subjective:   Patient seen and examined this AM. No complaints. She reports that she  feels better today and wasn't mildly short of breath till end of walking the hallways yesterday.    Objective:   BP (!) 161/66 (BP Location: Left Arm)   Pulse 87   Temp 98.4 F (36.9 C) (Oral)   Resp 14   Wt 81.3 kg   SpO2 100%   BMI 31.75 kg/m   Intake/Output Summary (Last 24 hours) at 04/28/2022 0849 Last data filed at 04/27/2022 2110 Gross per 24 hour  Intake 360 ml  Output 2200 ml  Net -1840 ml   Weight change: 0.181 kg  Physical Exam: Gen:nad CVS:s1s2, rrr Resp:cta bl MWN:UUVOZ, soft, nt Ext:no edema Neuro: awake, alert Dialysis access: RUE AVF +b/t  Imaging: No results found.  Labs: BMET Recent Labs  Lab 04/25/22 1345 04/25/22 2052 04/26/22 0023 04/27/22 1718  NA 135  --  132* 129*  K 2.8* 3.1* 3.5 3.4*  CL 93*  --  93* 93*  CO2 31  --  28 24  GLUCOSE 199*  --  193* 157*  BUN 13  --  18 35*  CREATININE 5.37*  --  6.40* 9.61*  CALCIUM 8.6*  --  8.5* 8.9  PHOS  --   --  4.5 4.9*   CBC Recent Labs  Lab 04/25/22 1345  04/26/22 0023 04/27/22 1718  WBC 13.5* 15.1* 13.7*  HGB 11.0* 10.2* 9.1*  HCT 34.7* 32.1* 28.1*  MCV 87.6 88.2 86.5  PLT 342 306 278    Medications:     apixaban  5 mg Oral BID   calcium acetate  1,334 mg Oral TID WC   Chlorhexidine Gluconate Cloth  6 each Topical Q0600   dextromethorphan-guaiFENesin  1 tablet Oral BID   diltiazem  240 mg Oral Daily   insulin aspart  0-5 Units Subcutaneous QHS   insulin aspart  0-6 Units Subcutaneous TID WC   insulin aspart  2 Units Subcutaneous TID WC   insulin glargine-yfgn  10 Units Subcutaneous QHS   pantoprazole  40 mg Oral Daily   QUEtiapine  200 mg Oral QHS   rosuvastatin  5 mg Oral Daily   ticagrelor  90 mg Oral BID

## 2022-04-28 NOTE — Care Management Important Message (Signed)
Important Message  Patient Details  Name: [name] MRN: [id number] Date of Birth: February 21, 1951   Medicare Important Message Given:  Yes     [name] 04/28/2022, 11:26 AM

## 2022-04-28 NOTE — Progress Notes (Signed)
SATURATION QUALIFICATIONS:  Patient Saturations on Room Air at Rest = 97%  Patient Saturations on Room Air while Ambulating = 95%  Pt c/o SOB during walk but no distress noted.

## 2022-04-28 NOTE — TOC Transition Note (Signed)
Transition of Care [facility]) - CM/SW Discharge Note   Patient Details  Name: [name] MRN: [id number] Date of Birth: 1951/02/26  [facility] CM/SW Contact:  Boneta Lucks, RN Phone Number: 04/28/2022, 2:05 PM   Clinical Narrative:   Patient is discharging home. Active with [facility], orders have been placed for HHPT.  Patient missed her Cardiology appointment being in the hospital.  New Appointment made for June 14th with Dr Percival Spanish and added to AVS and told patient.  She did not qualify for home oxygen.   Final next level of care: Home w Home Health Services Barriers to Discharge: Barriers Resolved   Patient Goals and CMS Choice Patient states their goals for this hospitalization and ongoing recovery are:: return home   Choice offered to / list presented to : Patient  Discharge Placement            Discharge Plan and Services In-house Referral: Clinical Social Work   Post Acute Care Choice: Resumption of Svcs/PTA Provider               HH Arranged: PT [place]: [place] Date [place]: 04/26/22 Time [place]: [id number] Representative spoke with at [place]: [place]  Readmission Risk Interventions    04/28/2022    2:04 PM 04/26/2022    8:27 AM 03/27/2022    4:22 PM  Readmission Risk Prevention Plan  Transportation Screening Complete Complete Complete  Medication Review Press photographer) Complete Complete Complete  PCP or Specialist appointment within 3-5 days of discharge Complete    HRI or [place] Complete Complete Complete  SW Recovery Care/Counseling Consult  Complete Complete  Palliative Care Screening  Not Applicable Not [place]  Not Applicable Not Applicable

## 2022-04-28 NOTE — Discharge Summary (Signed)
Physician Discharge Summary   Patient: [name] MRN: [id number] DOB: 1951/07/12  Admit date:     04/25/2022  Discharge date: 04/28/22  Discharge Physician: Barton Dubois   PCP: [name], MD   Recommendations at discharge:  Repeat CBC to follow WBCs and hemoglobin trend/stability. Repeat chest x-ray in 6 weeks to assure complete resolution of infiltrates. Follow complete resolution of patient's symptoms. Make sure patient has follow-up with nephrology and cardiology as instructed to further manage his ongoing renal failure and hypertension/paroxysmal atrial fibrillation.  Discharge Diagnoses: Principal Problem:   Multifocal pneumonia Active Problems:   Type II diabetes mellitus with nephropathy (HCC)   Prolonged Q-T interval on ECG   ESRD (end stage renal disease) on dialysis (HCC)   Obesity (BMI 30.0-34.9)   Essential hypertension   Mixed hyperlipidemia   Elevated troponin   GERD (gastroesophageal reflux disease)   Hypokalemia   Acute respiratory failure with hypoxia (HCC)   Sepsis (HCC)   PAF (paroxysmal atrial fibrillation) [facility])   Hospital Course: As per H&P written by Dr. Josephine Cables on 04/25/2022 [name] is a 71 y.o. female with medical history significant of hypertension, type 2 diabetes mellitus, ESRD on HD (TTS), paroxysmal A-fib, COPD, GERD, CAD s/p stent placement who presents to the emergency department due to worsening shortness of breath and palpitations that has been ongoing since yesterday.  Patient has a history of atrial fibrillation and she was cardioverted yesterday by Dr. Harrington Challenger after which she went back to sinus rhythm.  Patient complained of worsening shortness of breath in the evening, along with palpitation and temperature of 100.33F which self resolved this morning.  This was associated with nonradiating, nonreproducible midsternal chest tightness.  This morning, she went to dialysis and she was placed on oxygen during dialysis, however, she was  asked to go to the ED after the dialysis for further work-up and management.   ED Course:  In the emergency department, she was tachypneic, tachycardic, febrile (100.4F).  BP was 160/61.  Work-up in the ED showed leukocytosis and normocytic anemia.  BMP showed hypokalemia, BUN to creatinine 13/1.37, CBG 199, troponin x2 - 61 > 66.  Urinalysis was unimpressive for UTI.  Influenza A, B, SARS coronavirus 2 was negative. Chest x-ray showed cardiomegaly without acute abnormality of the lungs CT angiography chest with contrast showed no evidence of significant pulmonary embolus, but showed patchy nodular infiltrates demonstrated throughout both lungs most likely representing multifocal pneumonia. She was initially started on fosfomycin due to presumed UTI, but after CT angiogram of chest showed multifocal pneumonia, IV Levaquin was indicated.  Hospitalist was asked to admit patient for further evaluation and management.  Assessment and Plan: * Multifocal pneumonia -Complete antibiotics using oral Augmentin as prescribed. -Patient afebrile, speaking in full sentences and no requiring oxygen supplementation at time of discharge. -Continue the use of flutter valve and bronchodilator management. -Cultures remains negative. -CT angiogram demonstrated no pulmonary embolism and confirmed the presence of multifocal pneumonia. -Continue supportive care and maintain adequate hydration.  Type II diabetes mellitus with nephropathy (HCC) -Modified carbohydrate diet discussed with patient. -Still on home hypoglycemic regimen -Continue to follow CBG/A1c with further adjustment to current management as required.  PAF (paroxysmal atrial fibrillation) (HCC) -Continue current dose of Cardizem -Continue Eliquis for secondary prevention -Rate controlled and in sinus rhythm and currently. -Continue outpatient follow-up with cardiology service.  Sepsis ([place]) - Patient met sepsis criteria at time of admission with  elevated WBCs, tachypnea, fever and tachycardia. -Source of  infection identified as multifocal pneumonia based on CT images. -Lactic acid within normal limit -Cultures remain without specific growth; sepsis features resolved at discharge.  Acute respiratory failure with hypoxia (HCC) -In the setting of multifocal pneumonia -At time of discharge patient demonstrated good oxygen saturation without supplementation at rest and with exertion. -Continue current antibiotic therapy to complete management of her pneumonia infection.  Hypokalemia -Repleted -Continue to follow electrolytes trend and further adjust with hemodialysis treatment per nephrology discretion.  GERD (gastroesophageal reflux disease) -Continue PPI.  Elevated troponin -Flat elevation in the setting of demand ischemia due to severe sepsis secondary to multifocal pneumonia. -Patient denies chest pain -continue chronic use of brillinta  -Continue outpatient follow-up with cardiology service.   Mixed hyperlipidemia -Continue the use of Crestor.  Essential hypertension -Stable overall -Continue current antihypertensive agents. -Recommending to follow-up blood pressure and further adjust antihypertensive agents as needed. -Heart healthy/low-sodium diet discussed with patient.   Obesity (BMI 30.0-34.9) -BMI 31.89 -Low calorie diet, portion control and increase physical activity discussed with patient.  ESRD (end stage renal disease) on dialysis [facility]) -Nephrology service consulted for continuation of hemodialysis while inpatient. -Last treatment 04/27/2022, well-tolerated. -At discharge patient will resume on prior to admission outpatient schedule. -Next dialysis treatment 04/29/2022.    Prolonged Q-T interval on ECG -Continue to minimize/avoid the use of medications that can prolong QT -Close follow up of electrolytes and maintain stability as much as possible (potassium above 4 and magnesium more than  2)   Consultants: Nephrology service. Procedures performed: See below for x-ray reports. Disposition: Home Diet recommendation: Heart healthy/low-sodium and modified carbohydrate diet.  DISCHARGE MEDICATION: Allergies as of 04/28/2022       Reactions   Azithromycin    Prolonged QT   Ace Inhibitors Cough   Clopidogrel Bisulfate Other (See Comments)   Sick, Headache, "Felt terrible"   Codeine Other (See Comments)   hallucinations   Lisinopril Cough   Penicillins Other (See Comments)   Unknown childhood reaction Did it involve swelling of the face/tongue/throat, SOB, or low BP? Unknown Did it involve sudden or severe rash/hives, skin peeling, or any reaction on the inside of your mouth or nose? Unknown Did you need to seek medical attention at a hospital or doctor's office? Unknown When did it last happen?      childhood allergy If all above answers are "NO", may proceed with cephalosporin use.        Medication List     STOP taking these medications    omeprazole 20 MG capsule Commonly known as: PRILOSEC Replaced by: pantoprazole 40 MG tablet   torsemide 20 MG tablet Commonly known as: DEMADEX       TAKE these medications    acetaminophen 500 MG tablet Commonly known as: TYLENOL Take 1,000 mg by mouth every 6 (six) hours as needed for moderate pain or headache.   albuterol 108 (90 Base) MCG/ACT inhaler Commonly known as: VENTOLIN HFA Inhale 2 puffs into the lungs every 6 (six) hours as needed for wheezing or shortness of breath.   albuterol (2.5 MG/3ML) 0.083% nebulizer solution Commonly known as: PROVENTIL Take 3 mLs (2.5 mg total) by nebulization every 6 (six) hours as needed for wheezing or shortness of breath.   amoxicillin-clavulanate 500-125 MG tablet Commonly known as: Augmentin Take 1 tablet (500 mg total) by mouth daily for 5 days. Please take medication after HD   apixaban 5 MG Tabs tablet Commonly known as: ELIQUIS Take 1 tablet (5 mg total)  by  mouth 2 (two) times daily.   atenolol 100 MG tablet Commonly known as: TENORMIN Take 1 tablet (100 mg total) by mouth at bedtime.   Basaglar KwikPen 100 UNIT/ML Inject 25 Units into the skin at bedtime. Add five units each time the fasting is over 125 three days in a row.   budesonide-formoterol 160-4.5 MCG/ACT inhaler Commonly known as: SYMBICORT Inhale 2 puffs into the lungs 2 (two) times daily.   calcium acetate 667 MG capsule Commonly known as: PHOSLO Take 667-1,334 mg by mouth See admin instructions. Take 1334 mg with meals 3 times daily and 667 mg with snacks   cetirizine 10 MG tablet Commonly known as: ZYRTEC Take 1 tablet (10 mg total) by mouth daily as needed for allergies. What changed: when to take this   dextromethorphan-guaiFENesin 30-600 MG 12hr tablet Commonly known as: MUCINEX DM Take 1 tablet by mouth 2 (two) times daily.   diltiazem 240 MG 24 hr capsule Commonly known as: CARDIZEM CD Take 1 capsule (240 mg total) by mouth daily.   famotidine 20 MG tablet Commonly known as: PEPCID Take 1 tablet (20 mg total) by mouth 2 (two) times daily.   FreeStyle Libre 2 Reader Kerrin Mo USE TO TEST BLOOD SUGAR CONTINUOUSLY   FreeStyle Libre 2 Sensor Misc Use multiple times daily to track glucose to prevent highs and lows as a complication of dialysis Dx:Z99.2, E11.59   gabapentin 400 MG capsule Commonly known as: NEURONTIN Take 2 capsules (800 mg total) by mouth See admin instructions. Take it after HD What changed: additional instructions   hydrOXYzine 50 MG tablet Commonly known as: ATARAX Take 1 tablet (50 mg total) by mouth 3 (three) times daily as needed for anxiety.   lidocaine-prilocaine cream Commonly known as: EMLA Apply 1 application. topically as needed (dialysis).   LUBRICATING EYE DROPS OP Place 1 drop into both eyes daily as needed (dry eyes).   nitroGLYCERIN 0.4 MG SL tablet Commonly known as: NITROSTAT Place 1 tablet (0.4 mg total) under the  tongue every 5 (five) minutes as needed for chest pain.   ondansetron 4 MG tablet Commonly known as: ZOFRAN TAKE 1 TABLET BY MOUTH EVERY 8 HOURS AS NEEDED FOR NAUSEA AND VOMITING   OneTouch Ultra test strip Generic drug: glucose blood Use to test blood sugar 3 times daily as directed. DX: E11.9   Ozempic (0.25 or 0.5 MG/DOSE) 2 MG/1.5ML Sopn Generic drug: Semaglutide(0.25 or 0.5MG/DOS) Inject 0.5 mg into the skin every Friday.   pantoprazole 40 MG tablet Commonly known as: PROTONIX Take 1 tablet (40 mg total) by mouth 2 (two) times daily. Replaces: omeprazole 20 MG capsule   polyethylene glycol 17 g packet Commonly known as: MIRALAX / GLYCOLAX Take 17 g by mouth daily as needed for moderate constipation or mild constipation.   QUEtiapine 200 MG tablet Commonly known as: SEROQUEL Take 1 tablet (200 mg total) by mouth at bedtime. For sleep and for anxiety   rOPINIRole 1 MG tablet Commonly known as: REQUIP Take 1 tablet (1 mg total) by mouth at bedtime. For leg cramps   rosuvastatin 5 MG tablet Commonly known as: Crestor Take 1 tablet (5 mg total) by mouth daily. For cholesterol What changed: when to take this   ticagrelor 90 MG Tabs tablet Commonly known as: BRILINTA Take 1 tablet (90 mg total) by mouth 2 (two) times daily.        Follow-up Information     Care, [facility] Follow up.   Specialty: Home  Health Services Why: PT will call to schedule your next appointment. Contact information: [place] [address] [phone]         Minus Breeding, MD. Go to.   Specialty: Cardiology Why: June  14th Wed at [employer] information: [address] [place] [address] [phone]         [name], MD. Schedule an appointment as soon as possible for a visit in 2 week(s).   Specialty: Family Medicine Contact information: [place] [address] [phone]                 Discharge Exam: Danley Danker Weights   04/27/22 1710 04/27/22 2110 04/28/22 1523  Weight: 83.5 kg 81.3 kg 81.3 kg   General exam: Alert, awake, oriented x 3; reporting some intermittent coughing spells; not requiring oxygen supplementation, speaking in full sentences.  No fever, no chest pain, no nausea, no vomiting. Respiratory system: Improved air movement bilaterally.  No using accessory muscle.  Good saturation on room air. Cardiovascular system:RRR.  No rubs, no gallops, unable to properly assess JVD with body habitus. Gastrointestinal system: Abdomen is obese, nondistended, soft and nontender. No organomegaly or masses felt. Normal bowel sounds heard. Central nervous system: Alert and oriented. No focal neurological deficits. Extremities: No cyanosis or clubbing.  Right upper extremity with AV fistula in place demonstrating positive thrill and good bruit. Skin: No petechiae. Psychiatry: Judgement and insight appear normal. Mood & affect appropriate.    Condition at discharge: Stable and improved.  The results of significant diagnostics from this hospitalization (including imaging, microbiology, ancillary and laboratory) are listed below for reference.   Imaging Studies: DG Chest 2 View  Result Date: 04/25/2022 CLINICAL DATA:  AFib, palpitations, cardioversion yesterday EXAM: CHEST - 2 VIEW COMPARISON:  03/29/2022 FINDINGS: Cardiomegaly. Both lungs are clear. Disc degenerative disease of the thoracic spine. IMPRESSION: Cardiomegaly without acute abnormality of the lungs. Electronically Signed   By: Delanna Ahmadi M.D.   On: 04/25/2022 14:39   CT Angio Chest PE W and/or Wo Contrast  Result Date: 04/25/2022 CLINICAL DATA:  Pulmonary embolus suspected with high probability. Cardioversion yesterday. Heart is racing with shortness of breath. Atrial fibrillation. EXAM: CT ANGIOGRAPHY CHEST WITH CONTRAST TECHNIQUE: Multidetector CT imaging of the chest was performed using the standard protocol  during bolus administration of intravenous contrast. Multiplanar CT image reconstructions and MIPs were obtained to evaluate the vascular anatomy. RADIATION DOSE REDUCTION: This exam was performed according to the departmental dose-optimization program which includes automated exposure control, adjustment of the mA and/or kV according to patient size and/or use of iterative reconstruction technique. CONTRAST:  130mL OMNIPAQUE IOHEXOL 350 MG/ML SOLN COMPARISON:  02/02/2021 FINDINGS: Cardiovascular: Good opacification of the central and segmental pulmonary arteries. No focal filling defects. No evidence of significant pulmonary embolus. Diffuse cardiac enlargement. No pericardial effusions. Normal caliber thoracic aorta. Calcification of the aorta and coronary arteries. Mediastinum/Nodes: Esophagus is decompressed. Mediastinal lymph nodes are prominent, similar to prior study. Likely reactive. Thyroid gland is unremarkable. Lungs/Pleura: Patchy nodular infiltrates demonstrated throughout both lungs, new since prior study, most likely representing multifocal pneumonia. Consider COVID pneumonia. Metastatic disease would be a less likely consideration. No pleural effusions. No pneumothorax. Upper Abdomen: No acute abnormality. Musculoskeletal: No chest wall abnormality. No acute or significant osseous findings. Review of the MIP images confirms the above findings. IMPRESSION: 1. No evidence of significant pulmonary embolus. 2. Patchy nodular infiltrates demonstrated throughout both lungs most likely representing  multifocal pneumonia. Electronically Signed   By: Lucienne Capers M.D.   On: 04/25/2022 21:36    Microbiology: Results for orders placed or performed during the hospital encounter of 04/25/22  Resp Panel by RT-PCR (Flu A&B, Covid) Nasopharyngeal Swab     Status: None   Collection Time: 04/25/22  2:21 PM   Specimen: Nasopharyngeal Swab; Nasopharyngeal(NP) swabs in vial transport medium  Result Value Ref  Range Status   SARS Coronavirus 2 by RT PCR NEGATIVE NEGATIVE Final    Comment: (NOTE) SARS-CoV-2 target nucleic acids are NOT DETECTED.  The SARS-CoV-2 RNA is generally detectable in upper respiratory specimens during the acute phase of infection. The lowest concentration of SARS-CoV-2 viral copies this assay can detect is 138 copies/mL. A negative result does not preclude SARS-Cov-2 infection and should not be used as the sole basis for treatment or other patient management decisions. A negative result may occur with  improper specimen collection/handling, submission of specimen other than nasopharyngeal swab, presence of viral mutation(s) within the areas targeted by this assay, and inadequate number of viral copies(<138 copies/mL). A negative result must be combined with clinical observations, patient history, and epidemiological information. The expected result is Negative.  Fact Sheet for Patients:  EntrepreneurPulse.com.au  Fact Sheet for Healthcare Providers:  IncredibleEmployment.be  This test is no t yet approved or cleared by the [place] FDA and  has been authorized for detection and/or diagnosis of SARS-CoV-2 by FDA under an Emergency Use Authorization (EUA). This EUA will remain  in effect (meaning this test can be used) for the duration of the COVID-19 declaration under Section 564(b)(1) of the Act, 21 U.S.C.section 360bbb-3(b)(1), unless the authorization is terminated  or revoked sooner.       Influenza A by PCR NEGATIVE NEGATIVE Final   Influenza B by PCR NEGATIVE NEGATIVE Final    Comment: (NOTE) The Xpert Xpress SARS-CoV-2/FLU/RSV plus assay is intended as an aid in the diagnosis of influenza from Nasopharyngeal swab specimens and should not be used as a sole basis for treatment. Nasal washings and aspirates are unacceptable for Xpert Xpress SARS-CoV-2/FLU/RSV testing.  Fact Sheet for  Patients: EntrepreneurPulse.com.au  Fact Sheet for Healthcare Providers: IncredibleEmployment.be  This test is not yet approved or cleared by the [place] FDA and has been authorized for detection and/or diagnosis of SARS-CoV-2 by FDA under an Emergency Use Authorization (EUA). This EUA will remain in effect (meaning this test can be used) for the duration of the COVID-19 declaration under Section 564(b)(1) of the Act, 21 U.S.C. section 360bbb-3(b)(1), unless the authorization is terminated or revoked.  Performed at [facility], [address] [place], [place] [address]   Urine Culture     Status: Abnormal   Collection Time: 04/25/22  6:44 PM   Specimen: Urine, Clean Catch  Result Value Ref Range Status   Specimen Description   Final    URINE, CLEAN CATCH Performed at [facility], [address], [place], [place] [address]    Special Requests   Final    NONE Performed at [facility], [address], [place], [place] [address]    Culture (A)  Final    <10,000 COLONIES/mL INSIGNIFICANT GROWTH Performed at [facility], [place] [address], [place], [place] [address]    Report Status 04/27/2022 FINAL  Final    Labs: CBC: Recent Labs  Lab 04/25/22 1345 04/26/22 0023 04/27/22 1718  WBC 13.5* 15.1* 13.7*  HGB 11.0* 10.2* 9.1*  HCT 34.7* 32.1* 28.1*  MCV 87.6 88.2 86.5  PLT 342 306 301   Basic Metabolic Panel: Recent Labs  Lab 04/25/22 1345 04/25/22 2052 04/26/22 0023 04/27/22 1718  NA 135  --  132* 129*  K 2.8* 3.1* 3.5 3.4*  CL 93*  --  93* 93*  CO2 31  --  28 24  GLUCOSE 199*  --  193* 157*  BUN 13  --  18 35*  CREATININE 5.37*  --  6.40* 9.61*  CALCIUM 8.6*  --  8.5* 8.9  MG  --   --  1.9  --   PHOS  --   --  4.5 4.9*   Liver Function Tests: Recent Labs  Lab 04/26/22 0023 04/27/22 1718  AST 14*  --   ALT 17  --   ALKPHOS 72  --   BILITOT 0.5  --   PROT 6.7  --   ALBUMIN 3.3* 2.8*   CBG: Recent Labs   Lab 04/27/22 1111 04/27/22 1620 04/27/22 2218 04/28/22 0733 04/28/22 1114  GLUCAP 181* 176* 109* 90 117*    Discharge time spent: greater than 30 minutes.  Signed: Barton Dubois, MD [facility] 04/28/2022

## 2022-04-29 DIAGNOSIS — N2581 Secondary hyperparathyroidism of renal origin: Secondary | ICD-10-CM | POA: Diagnosis not present

## 2022-04-29 DIAGNOSIS — D631 Anemia in chronic kidney disease: Secondary | ICD-10-CM | POA: Diagnosis not present

## 2022-04-29 DIAGNOSIS — N250 Renal osteodystrophy: Secondary | ICD-10-CM | POA: Diagnosis not present

## 2022-04-29 DIAGNOSIS — D509 Iron deficiency anemia, unspecified: Secondary | ICD-10-CM | POA: Diagnosis not present

## 2022-04-29 DIAGNOSIS — Z992 Dependence on renal dialysis: Secondary | ICD-10-CM | POA: Diagnosis not present

## 2022-04-29 DIAGNOSIS — N186 End stage renal disease: Secondary | ICD-10-CM | POA: Diagnosis not present

## 2022-05-02 DIAGNOSIS — D631 Anemia in chronic kidney disease: Secondary | ICD-10-CM | POA: Diagnosis not present

## 2022-05-02 DIAGNOSIS — N186 End stage renal disease: Secondary | ICD-10-CM | POA: Diagnosis not present

## 2022-05-02 DIAGNOSIS — N2581 Secondary hyperparathyroidism of renal origin: Secondary | ICD-10-CM | POA: Diagnosis not present

## 2022-05-02 DIAGNOSIS — N250 Renal osteodystrophy: Secondary | ICD-10-CM | POA: Diagnosis not present

## 2022-05-02 DIAGNOSIS — Z992 Dependence on renal dialysis: Secondary | ICD-10-CM | POA: Diagnosis not present

## 2022-05-02 DIAGNOSIS — D509 Iron deficiency anemia, unspecified: Secondary | ICD-10-CM | POA: Diagnosis not present

## 2022-05-02 LAB — GLUCOSE, CAPILLARY
Glucose-Capillary: 219 mg/dL — ABNORMAL HIGH (ref 70–99)
Glucose-Capillary: 107 mg/dL — ABNORMAL HIGH (ref 70–99)

## 2022-05-03 DIAGNOSIS — Z48812 Encounter for surgical aftercare following surgery on the circulatory system: Secondary | ICD-10-CM | POA: Diagnosis not present

## 2022-05-03 DIAGNOSIS — I480 Paroxysmal atrial fibrillation: Secondary | ICD-10-CM | POA: Diagnosis not present

## 2022-05-03 DIAGNOSIS — I152 Hypertension secondary to endocrine disorders: Secondary | ICD-10-CM | POA: Diagnosis not present

## 2022-05-03 DIAGNOSIS — E1159 Type 2 diabetes mellitus with other circulatory complications: Secondary | ICD-10-CM | POA: Diagnosis not present

## 2022-05-03 DIAGNOSIS — J449 Chronic obstructive pulmonary disease, unspecified: Secondary | ICD-10-CM

## 2022-05-03 DIAGNOSIS — Z794 Long term (current) use of insulin: Secondary | ICD-10-CM

## 2022-05-03 DIAGNOSIS — I2510 Atherosclerotic heart disease of native coronary artery without angina pectoris: Secondary | ICD-10-CM | POA: Diagnosis not present

## 2022-05-03 DIAGNOSIS — I4891 Unspecified atrial fibrillation: Secondary | ICD-10-CM

## 2022-05-03 DIAGNOSIS — I5033 Acute on chronic diastolic (congestive) heart failure: Secondary | ICD-10-CM | POA: Diagnosis not present

## 2022-05-03 DIAGNOSIS — E1165 Type 2 diabetes mellitus with hyperglycemia: Secondary | ICD-10-CM

## 2022-05-03 MED ORDER — BUDESONIDE-FORMOTEROL FUMARATE 160-4.5 MCG/ACT IN AERO: 2.0000 | INHALATION_SPRAY | Freq: Two times a day (BID) | RESPIRATORY_TRACT | 4 refills | Status: AC

## 2022-05-03 MED ORDER — TRESIBA FLEXTOUCH 100 UNIT/ML ~~LOC~~ SOPN: 20.0000 [IU] | PEN_INJECTOR | Freq: Every day | SUBCUTANEOUS | Status: AC

## 2022-05-03 NOTE — Patient Instructions (Addendum)
Visit Information  Following are the goals we discussed today:  Current Barriers:  Unable to independently afford treatment regimen Unable to maintain control of T2DM, HLD Suboptimal therapeutic regimen for T2DM  Pharmacist Clinical Goal(s):  patient will verbalize ability to afford treatment regimen achieve control of T2DM as evidenced by GOAL A1C, IMPROVED BLOOD SUGARS WITH STEADY CONTROL through collaboration with PharmD and provider.   Interventions: 1:1 collaboration with Tamara Fraise, MD regarding development and update of comprehensive plan of care as evidenced by provider attestation and co-signature Inter-disciplinary care team collaboration (see longitudinal plan of care) Comprehensive medication review performed; medication list updated in electronic medical record  Diabetes: Goal on Track (progressing): YES. Uncontrolled--A1C 7.9%-->8%; current treatment:  TRESIBA 28 UNITS, Ozempic 0.5MG  SQ WEEKLY  INTOLERANCES:  DID NOT TOLERATE TRULICITY WELL DUE TO GI (EVEN AT LOWEST DOSE) T2DM REGIMEN LIMITED DUE TO HD TIW CONTINUE OZEMPIC 0.5MG  SQ WEEKLY (CONSIDER 1MG  WEEKLY) Denies personal and family history of Medullary thyroid cancer (MTC) TOLERATING LOW DOSE OZEMPIC; OCCASIONALLY USES ZOFRAN FOR NAUSEA, BUT HAS OTHER CAUSES OF NAUSEA AS WELL Application APPROVED for [employer] [place] for ozempic patient assistance on 12/06/21 (sample given today) LIBRE 2 COVERED UNDER INSURANCE FOR $0 COPAY TOTAL MEDICAL SUPPLY VIA [place] PORTAL for all libre 2 products (cancelled ADS duplicate orders for libre--just total medical supply now) Libre 2 CGM education provided Patient to use phone as reader Left arm (since graft/HD on right side)-replaced sensor Current glucose readings: fasting glucose: <155, post prandial glucose: n/a Must follow dialysis diet Current exercise: n/a; HD TIW Recommended continue low dose ozempic;  Assessed patient finances. Approved for Ozempic/Tresiba PATIENT  ASSISTANCE via novo nordisk; APPLICATION SUBMITTED FOR SYMBICORT TO [employer] PATIENT ASSISTANCE--PATIENT DID NOT LIKE BREZTRI (received symbicort shipment--refills submitted today 05/03/22)  Hyperlipidemia:  Condition stable. Not addressed this visit. Uncontrolled; current treatment:CRESTOR (ROSUVASTATIN 5MG  DAILY);  Current dietary patterns: ON HD 3X WEEKLY; NOT HUGE APPETITE, HOWEVER DOES ENJOY SWEETS, OZEMPIC TO ASSIST Recommended HEART HEALTHY DIET, TAKE STATIN AS PRESCRIBED, NEW START OZEMPIC SHOULD LIKELY HELP WITH LIPID PANEL Lipid Panel     Component Value Date/Time   CHOL 203 (H) 01/31/2021 1001   CHOL 241 (H) 03/25/2013 1026   TRIG 240 (H) 01/31/2021 1001   TRIG 147 10/09/2013 1527   TRIG 209 (H) 03/25/2013 1026   HDL 36 (L) 01/31/2021 1001   HDL 65 10/09/2013 1527   HDL 51 03/25/2013 1026   CHOLHDL 5.6 (H) 01/31/2021 1001   CHOLHDL 3.0 06/17/2008 0625   VLDL 19 06/17/2008 0625   LDLCALC 124 (H) 01/31/2021 1001   LDLCALC 125 (H) 10/09/2013 1527   LDLCALC 148 (H) 03/25/2013 1026   LABVLDL 43 (H) 01/31/2021 1001   Patient Goals/Self-Care Activities patient will:  - take medications as prescribed as evidenced by patient report and record review check glucose DAILY OR IF SYMPTOMATIC, document, and provide at future appointments engage in dietary modifications by HEART HEALTHY/dialysis DIET   Plan: Telephone follow up appointment with care management team member scheduled for:  1 MONTH  Signature [name], PharmD, BCPS Clinical Pharmacist, [place]  II Phone [phone]   Please call the care guide team at [phone] if you need to cancel or reschedule your appointment.   The patient verbalized understanding of instructions, educational materials, and care plan provided today and DECLINED offer to receive copy of patient instructions, educational materials, and care plan.

## 2022-05-03 NOTE — Progress Notes (Signed)
Chronic Care Management Pharmacy Note  04/21/2022 Name:  [name] MRN:  [id number] DOB:  02-12-1951  Summary:  Diabetes: Goal on Track (progressing): YES. Uncontrolled--A1C 7.9%-->8%; current treatment:  TRESIBA 28 UNITS, Ozempic 0.5MG SQ WEEKLY  INTOLERANCES:  DID NOT TOLERATE TRULICITY WELL DUE TO GI (EVEN AT LOWEST DOSE) T2DM REGIMEN LIMITED DUE TO HD TIW CONTINUE OZEMPIC 0.5MG SQ WEEKLY (CONSIDER 1MG WEEKLY) Denies personal and family history of Medullary thyroid cancer (MTC) TOLERATING LOW DOSE OZEMPIC; OCCASIONALLY USES ZOFRAN FOR NAUSEA, BUT HAS OTHER CAUSES OF NAUSEA AS WELL Application APPROVED for NOVO [facility] for ozempic patient assistance on 12/06/21 (sample given today) LIBRE 2 COVERED UNDER INSURANCE FOR $0 COPAY TOTAL MEDICAL SUPPLY VIA [place] PORTAL for all libre 2 products (cancelled ADS duplicate orders for libre--just total medical supply now) Libre 2 CGM education provided Patient to use phone as reader Left arm (since graft/HD on right side)-replaced sensor Current glucose readings: fasting glucose: <155, post prandial glucose: n/a Must follow dialysis diet Current exercise: n/a; HD TIW Recommended continue low dose ozempic;  Assessed patient finances. Approved for Ozempic/Tresiba PATIENT ASSISTANCE via novo nordisk; APPLICATION SUBMITTED FOR SYMBICORT TO AZ&ME PATIENT ASSISTANCE--PATIENT DID NOT LIKE BREZTRI (received symbicort shipment--refills submitted today 05/03/22)  Hyperlipidemia:  Condition stable. Not addressed this visit. Uncontrolled; current treatment:CRESTOR (ROSUVASTATIN 5MG DAILY);  Current dietary patterns: ON HD 3X WEEKLY; NOT HUGE APPETITE, HOWEVER DOES ENJOY SWEETS, OZEMPIC TO ASSIST Recommended HEART HEALTHY DIET, TAKE STATIN AS PRESCRIBED, NEW START OZEMPIC SHOULD LIKELY HELP WITH LIPID PANEL Lipid Panel     Component Value Date/Time   CHOL 203 (H) 01/31/2021 1001   CHOL 241 (H) 03/25/2013 1026   TRIG 240 (H) 01/31/2021 1001    TRIG 147 10/09/2013 1527   TRIG 209 (H) 03/25/2013 1026   HDL 36 (L) 01/31/2021 1001   HDL 65 10/09/2013 1527   HDL 51 03/25/2013 1026   CHOLHDL 5.6 (H) 01/31/2021 1001   CHOLHDL 3.0 06/17/2008 0625   VLDL 19 06/17/2008 0625   LDLCALC 124 (H) 01/31/2021 1001   LDLCALC 125 (H) 10/09/2013 1527   LDLCALC 148 (H) 03/25/2013 1026   LABVLDL 43 (H) 01/31/2021 1001   Subjective: [name] is an 71 y.o. year old female who is a primary patient of [name], [name], MD.  The CCM team was consulted for assistance with disease management and care coordination needs.    Engaged with patient by telephone for follow up visit in response to provider referral for pharmacy case management and/or care coordination services.   Consent to Services:  The patient was given information about Chronic Care Management services, agreed to services, and gave verbal consent prior to initiation of services.  Please see initial visit note for detailed documentation.   Patient Care Team: [name], MD as PCP - General (Family Medicine) Minus Breeding, MD as PCP - Cardiology (Cardiology) Fran Lowes, MD (Inactive) as Consulting Physician (Nephrology) [name], MD as Referring Physician (Family Medicine) Minus Breeding, MD as Consulting Physician (Cardiology) [place], [place], Delice Bison, RN as Case Manager Rosalee Tolley, Royce Macadamia, [facility] (Pharmacist) [place], [facility]   Objective:  Lab Results  Component Value Date   CREATININE 9.61 (H) 04/27/2022   CREATININE 6.40 (H) 04/26/2022   CREATININE 5.37 (H) 04/25/2022    Lab Results  Component Value Date   HGBA1C 8.0 (H) 03/06/2022   Last diabetic Eye exam:  Lab Results  Component Value Date/Time  HMDIABEYEEXA No Retinopathy 11/02/2021 12:00 AM    Last diabetic Foot exam: No results found for: HMDIABFOOTEX      Component Value Date/Time   CHOL 127 03/06/2022 0616   CHOL 203 (H)  01/31/2021 1001   CHOL 241 (H) 03/25/2013 1026   TRIG 95 03/06/2022 0616   TRIG 147 10/09/2013 1527   TRIG 209 (H) 03/25/2013 1026   HDL 44 03/06/2022 0616   HDL 36 (L) 01/31/2021 1001   HDL 65 10/09/2013 1527   HDL 51 03/25/2013 1026   CHOLHDL 2.9 03/06/2022 0616   VLDL 19 03/06/2022 0616   LDLCALC 64 03/06/2022 0616   LDLCALC 124 (H) 01/31/2021 1001   LDLCALC 125 (H) 10/09/2013 1527   LDLCALC 148 (H) 03/25/2013 1026       Latest Ref Rng & Units 04/27/2022    5:18 PM 04/26/2022   12:23 AM 03/29/2022    4:16 AM  Hepatic Function  Total Protein 6.5 - 8.1 g/dL  6.7     Albumin 3.5 - 5.0 g/dL 2.8   3.3   3.4    AST 15 - 41 U/L  14     ALT 0 - 44 U/L  17     Alk Phosphatase 38 - 126 U/L  72     Total Bilirubin 0.3 - 1.2 mg/dL  0.5       Lab Results  Component Value Date/Time   TSH 3.290 03/22/2022 04:46 PM   TSH 4.743 (H) 03/04/2022 08:41 PM   TSH 2.450 03/07/2021 12:32 PM   FREET4 1.11 03/22/2022 04:46 PM      Latest Ref Rng & Units 04/27/2022    5:18 PM 04/26/2022   12:23 AM 04/25/2022    1:45 PM  CBC  WBC 4.0 - 10.5 K/uL 13.7   15.1   13.5    Hemoglobin 12.0 - 15.0 g/dL 9.1   10.2   11.0    Hematocrit 36.0 - 46.0 % 28.1   32.1   34.7    Platelets 150 - 400 K/uL 278   306   342      Lab Results  Component Value Date/Time   VD25OH 26.1 (L) 10/09/2013 03:27 PM   VD25OH 36 03/25/2013 10:26 AM    Clinical ASCVD: Yes  The ASCVD Risk score (Arnett DK, et al., 2019) failed to calculate for the following reasons:   The patient has a prior MI or stroke diagnosis    Other: (CHADS2VASc if Afib, PHQ9 if depression, MMRC or CAT for COPD, ACT, DEXA)  Social History   Tobacco Use  Smoking Status Former   Packs/day: 1.00   Years: 30.00   Pack years: 30.00   Types: Cigarettes   Quit date: 12/04/2001   Years since quitting: 20.4  Smokeless Tobacco Never   BP Readings from Last 3 Encounters:  04/28/22 (!) 155/69  04/24/22 (!) 163/73  04/12/22 124/69   Pulse Readings  from Last 3 Encounters:  04/28/22 91  04/24/22 92  04/12/22 91   Wt Readings from Last 3 Encounters:  04/28/22 179 lb 3.7 oz (81.3 kg)  04/24/22 180 lb (81.6 kg)  04/12/22 182 lb 9.6 oz (82.8 kg)    Assessment: Review of patient past medical history, allergies, medications, health status, including review of consultants reports, laboratory and other test data, was performed as part of comprehensive evaluation and provision of chronic care management services.   SDOH:  (Social Determinants of Health) assessments and interventions performed:    CCM  Care Plan  Allergies  Allergen Reactions   Azithromycin     Prolonged QT   Ace Inhibitors Cough   Clopidogrel Bisulfate Other (See Comments)    Sick, Headache, "Felt terrible"   Codeine Other (See Comments)    hallucinations   Lisinopril Cough   Penicillins Other (See Comments)    Unknown childhood reaction Did it involve swelling of the face/tongue/throat, SOB, or low BP? Unknown Did it involve sudden or severe rash/hives, skin peeling, or any reaction on the inside of your mouth or nose? Unknown Did you need to seek medical attention at a hospital or doctor's office? Unknown When did it last happen?      childhood allergy If all above answers are "NO", may proceed with cephalosporin use.      Medications Reviewed Today     Reviewed by Lavera Guise, [facility] (Pharmacist) on 05/03/22 at 1137  Med List Status: <None>   Medication Order Taking? Sig Documenting Provider Last Dose Status Informant  acetaminophen (TYLENOL) 500 MG tablet [id number] No Take 1,000 mg by mouth every 6 (six) hours as needed for moderate pain or headache. [provider] unk Active Self  albuterol (PROVENTIL) (2.5 MG/3ML) 0.083% nebulizer solution [id number] No Take 3 mLs (2.5 mg total) by nebulization every 6 (six) hours as needed for wheezing or shortness of breath. [name], MD 04/24/2022 Active Self  albuterol (VENTOLIN HFA) 108 (90 Base)  MCG/ACT inhaler [id number] No Inhale 2 puffs into the lungs every 6 (six) hours as needed for wheezing or shortness of breath. [name], MD unk Active Self  amoxicillin-clavulanate (AUGMENTIN) 500-125 MG tablet [id number]  Take 1 tablet (500 mg total) by mouth daily for 5 days. Please take medication after HD Barton Dubois, MD  Active   apixaban (ELIQUIS) 5 MG TABS tablet [id number] No Take 1 tablet (5 mg total) by mouth 2 (two) times daily. Minus Breeding, MD 04/25/2022 1230 Active Self  atenolol (TENORMIN) 100 MG tablet [id number] No Take 1 tablet (100 mg total) by mouth at bedtime. Phillips Grout, MD 04/24/2022 Expired 04/30/22 2359 Self  budesonide-formoterol (SYMBICORT) 160-4.5 MCG/ACT inhaler [id number]  Inhale 2 puffs into the lungs 2 (two) times daily. [name], MD  Active   calcium acetate (PHOSLO) 667 MG capsule [id number] No Take 667-1,334 mg by mouth See admin instructions. Take 1334 mg with meals 3 times daily and 667 mg with snacks [provider] 04/25/2022 Active Self  Carboxymethylcellul-Glycerin (LUBRICATING EYE DROPS OP) [id number] No Place 1 drop into both eyes daily as needed (dry eyes). [provider] 04/24/2022 Active Self  cetirizine (ZYRTEC) 10 MG tablet [id number] No Take 1 tablet (10 mg total) by mouth daily as needed for allergies.  Patient taking differently: Take 10 mg by mouth daily.   [name], MD unk Active Self  Continuous Blood Gluc Receiver (FREESTYLE LIBRE 2 READER) DEVI [id number] No USE TO TEST BLOOD SUGAR Liborio Nixon, MD Taking Active Self  Continuous Blood Gluc Sensor (FREESTYLE LIBRE 2 SENSOR) MISC [id number] No Use multiple times daily to track glucose to prevent highs and lows as a complication of dialysis Dx:Z99.2, E11.59 [name], MD Taking Active Self           Med Note Nash Mantis, TIFFANI S   Mon Mar 06, 2022  9:52 AM)    dextromethorphan-guaiFENesin (MUCINEX DM) 30-600 MG 12hr tablet [id number]  Take 1 tablet by  mouth 2 (two) times daily. Barton Dubois, MD  Active   diltiazem [facility] CD)  240 MG 24 hr capsule [id number] No Take 1 capsule (240 mg total) by mouth daily. [name], MD 04/25/2022 Active Self  famotidine (PEPCID) 20 MG tablet [id number] No Take 1 tablet (20 mg total) by mouth 2 (two) times daily.  Patient not taking: Reported on 04/25/2022   Dettinger, Fransisca Kaufmann, MD Not Taking Active Self  gabapentin (NEURONTIN) 400 MG capsule [id number] No Take 2 capsules (800 mg total) by mouth See admin instructions. Take it after HD  Patient taking differently: Take 800 mg by mouth See admin instructions. Take after dialysis   [name], MD 04/25/2022 Active Self  glucose blood (ONETOUCH ULTRA) test strip [id number] No Use to test blood sugar 3 times daily as directed. DX: E11.9 [name], MD Taking Active Self  hydrOXYzine (ATARAX) 50 MG tablet [id number] No Take 1 tablet (50 mg total) by mouth 3 (three) times daily as needed for anxiety. [name], MD 04/24/2022 Active Self  Discontinued 05/03/22 1137 (Change in therapy)          Med Note (Teodoro Jeffreys D   Tue Nov 15, 2021 11:45 AM) VIA [facility] PATIENT ASSISTANCE PROGRAM--ESCRIBE CHANGES OR REFILLS TO RX [facility] IN [place] PER PROGRAM   lidocaine-prilocaine (EMLA) cream [id number] No Apply 1 application. topically as needed (dialysis). [provider] 04/25/2022 Active Self  nitroGLYCERIN (NITROSTAT) 0.4 MG SL tablet [id number] No Place 1 tablet (0.4 mg total) under the tongue every 5 (five) minutes as needed for chest pain. Minus Breeding, MD unk Active Self  ondansetron (ZOFRAN) 4 MG tablet [id number] No TAKE 1 TABLET BY MOUTH EVERY 8 HOURS AS NEEDED FOR NAUSEA AND VOMITING [name], MD unk Active Self  pantoprazole (PROTONIX) 40 MG tablet [id number]  Take 1 tablet (40 mg total) by mouth 2 (two) times daily. Barton Dubois, MD  Active   polyethylene glycol (MIRALAX / GLYCOLAX) 17 g packet [id number] No Take 17 g by  mouth daily as needed for moderate constipation or mild constipation. Pokhrel, Corrie Mckusick, MD unk Active Self  QUEtiapine (SEROQUEL) 200 MG tablet [id number] No Take 1 tablet (200 mg total) by mouth at bedtime. For sleep and for anxiety [name], MD 04/24/2022 Active Self           Med Note Jilda Roche A   Tue Apr 18, 2022  3:38 PM) Pt states it was increased recently and has been feeling like it was too much (felt bad the next day) plans to talk to the dr about it  rOPINIRole (REQUIP) 1 MG tablet [id number] No Take 1 tablet (1 mg total) by mouth at bedtime. For leg cramps [name], MD 04/24/2022 Active Self  rosuvastatin (CRESTOR) 5 MG tablet [id number] No Take 1 tablet (5 mg total) by mouth daily. For cholesterol  Patient taking differently: Take 5 mg by mouth at bedtime. For cholesterol   [name], MD 04/24/2022 Active Self  Semaglutide,0.25 or 0.5MG/DOS, (OZEMPIC, 0.25 OR 0.5 MG/DOSE,) 2 MG/1.5ML SOPN [id number] No Inject 0.5 mg into the skin every Friday. [provider] Past Week Active Self           Med Note Nash Mantis, TIFFANI S   Mon Mar 06, 2022  9:52 AM)    ticagrelor (BRILINTA) 90 MG TABS tablet [id number] No Take 1 tablet (90 mg total) by mouth 2 (two) times daily. Minus Breeding, MD 04/25/2022 1230 Active Self            Patient Active Problem List   Diagnosis Date Noted  Acute respiratory failure with hypoxia (HCC) 04/26/2022   Sepsis ([place]) 04/26/2022   PAF (paroxysmal atrial fibrillation) ([place]) 04/26/2022   Multifocal pneumonia 04/25/2022   Dyslipidemia 04/04/2022   Acute exacerbation of Diastolic CHF (congestive heart failure) ([place]) 03/26/2022   Physical deconditioning 03/09/2022   Hyponatremia 03/09/2022   Pulmonary hypertension ([place]) 03/06/2022   Hypokalemia 03/05/2022   Atrial fibrillation with RVR ([place]) 03/04/2022   Change in bowel habit 12/16/2021   Colon cancer screening 12/16/2021   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   Family history of  colonic polyps 12/16/2021   Acute CHF (congestive heart failure) ([place]) 12/06/2021   Leukocytosis 12/06/2021   Elevated brain natriuretic peptide (BNP) level 12/06/2021   Elevated troponin 12/06/2021   GERD (gastroesophageal reflux disease) 12/06/2021   Nausea 08/26/2021   Lumbago of lumbar region with sciatica 07/21/2021   Arthropathy of lumbar facet joint 06/27/2021   Radiculopathy, lumbar region 05/05/2021   Chest pain 02/19/2021   Essential hypertension    Mixed hyperlipidemia    Type 2 diabetes mellitus with hyperglycemia (HCC)    Hypoalbuminemia    Bruit 01/05/2020   Educated about COVID-19 virus infection 01/05/2020   End stage renal disease on dialysis due to type 2 diabetes mellitus ([place]) 07/21/2019   Lung nodule 05/01/2018   Prolonged Q-T interval on ECG 09/06/2017   Unstable angina (HCC) 09/06/2017   GAD (generalized anxiety disorder) 03/04/2016   Leg pain 01/07/2016   Anemia of chronic disease/ESRD 12/10/2015   Dependence on renal dialysis ([place]) 12/10/2015   ESRD (end stage renal disease) on dialysis ([place]) 12/10/2015   Osteopathy in diseases classified elsewhere, unspecified site 12/10/2015   Constipation 10/25/2015   COPD (chronic obstructive pulmonary disease) ([place]) 03/06/2015   Pericardial effusion 03/06/2015   Chronic diastolic CHF (congestive heart failure) ([place]) 03/05/2015   PAD (peripheral artery disease) ([place]) 04/21/2014   Dyspnea 09/29/2013   Obesity (BMI 30.0-34.9) 12/05/2011   Diverticulosis 03/29/2011   Diabetic neuropathy ([place]) 03/29/2011   Esophageal stricture 03/29/2011   Vitamin D deficiency 03/29/2011   Degenerative joint disease of left shoulder 03/29/2011   Neck pain 03/29/2011   Type II diabetes mellitus with nephropathy ([place]) 04/19/2009   Hyperlipidemia associated with type 2 diabetes mellitus ([place]) 04/19/2009   Hypertension associated with diabetes ([place]) 04/19/2009   Coronary artery disease of native artery of native heart with stable angina  pectoris ([place]) 04/19/2009    Immunization History  Administered Date(s) Administered   DTaP 07/19/2010   Hepatitis B, adult 12/04/2015, 01/04/2016, 02/08/2016, 06/01/2016, 10/17/2016, 11/11/2016, 12/16/2016, 04/12/2017, 04/08/2019   Influenza Split 10/04/2013, 09/14/2015   Influenza Whole 09/03/2006   Influenza, Seasonal, Injecte, Preservative Fre 08/24/2011, 09/25/2012   Influenza,inj,Quad PF,6+ Mos 09/11/2013, 09/09/2014   Influenza-Unspecified 09/14/2015, 08/25/2016, 08/21/2019, 09/09/2020, 09/15/2021   Moderna SARS-COV2 Booster Vaccination 11/03/2020, 09/30/2021   Moderna Sars-Covid-2 Vaccination 02/09/2020, 03/11/2020   PPD Test 11/23/2015, 12/26/2016, 01/03/2018, 01/09/2019, 04/20/2020, 03/31/2021   Pneumococcal Conjugate-13 07/07/2017   Pneumococcal Polysaccharide-23 08/26/2012, 07/07/2017, 12/13/2018   Pneumococcal-Unspecified 08/26/2012, 07/07/2017   Td 07/19/2010   Tdap 08/24/2011   Zoster Recombinat (Shingrix) 09/09/2019, 11/10/2019    Conditions to be addressed/monitored: HLD and DMII  Care Plan : PHARMD MEDICATION MANAGEMENT  Updates made by Lavera Guise, [place] since 05/03/2022 12:00 AM     Problem: DISEASE PROGRESSION PREVENTION      Long-Range Goal: T2DM, HLD   Recent Progress: Not on track  Priority: High  Note:   Current Barriers:  Unable to independently afford treatment regimen Unable to  maintain control of T2DM, HLD Suboptimal therapeutic regimen for T2DM  Pharmacist Clinical Goal(s):  patient will verbalize ability to afford treatment regimen achieve control of T2DM as evidenced by GOAL A1C, IMPROVED BLOOD SUGARS WITH STEADY CONTROL  through collaboration with PharmD and provider.   Interventions: 1:1 collaboration with [name], MD regarding development and update of comprehensive plan of care as evidenced by provider attestation and co-signature Inter-disciplinary care team collaboration (see longitudinal plan of care) Comprehensive  medication review performed; medication list updated in electronic medical record  Diabetes: Goal on Track (progressing): YES. Uncontrolled--A1C 7.9%-->8%; current treatment:  TRESIBA 28 UNITS, Ozempic 0.5MG SQ WEEKLY  INTOLERANCES:  DID NOT TOLERATE TRULICITY WELL DUE TO GI (EVEN AT LOWEST DOSE) T2DM REGIMEN LIMITED DUE TO HD TIW CONTINUE OZEMPIC 0.5MG SQ WEEKLY (CONSIDER 1MG WEEKLY) Denies personal and family history of Medullary thyroid cancer (MTC) TOLERATING LOW DOSE OZEMPIC; OCCASIONALLY USES ZOFRAN FOR NAUSEA, BUT HAS OTHER CAUSES OF NAUSEA AS WELL Application APPROVED for NOVO [place] for ozempic patient assistance on 12/06/21 (sample given today) LIBRE 2 COVERED UNDER INSURANCE FOR $0 COPAY TOTAL MEDICAL SUPPLY VIA [place] PORTAL for all libre 2 products (cancelled ADS duplicate orders for libre--just total medical supply now) Libre 2 CGM education provided Patient to use phone as reader Left arm (since graft/HD on right side)-replaced sensor Current glucose readings: fasting glucose: <155, post prandial glucose: n/a Must follow dialysis diet Current exercise: n/a; HD TIW Recommended continue low dose ozempic;  Assessed patient finances. Approved for Ozempic/Tresiba PATIENT ASSISTANCE via novo nordisk; APPLICATION SUBMITTED FOR SYMBICORT TO AZ&ME PATIENT ASSISTANCE--PATIENT DID NOT LIKE BREZTRI  (received symbicort shipment--refills submitted today 05/03/22)  Hyperlipidemia:  Condition stable. Not addressed this visit. Uncontrolled; current treatment:CRESTOR (ROSUVASTATIN 5MG DAILY);  Current dietary patterns: ON HD 3X WEEKLY; NOT HUGE APPETITE, HOWEVER DOES ENJOY SWEETS, OZEMPIC TO ASSIST Recommended HEART HEALTHY DIET, TAKE STATIN AS PRESCRIBED, NEW START OZEMPIC SHOULD LIKELY HELP WITH LIPID PANEL Lipid Panel     Component Value Date/Time   CHOL 203 (H) 01/31/2021 1001   CHOL 241 (H) 03/25/2013 1026   TRIG 240 (H) 01/31/2021 1001   TRIG 147 10/09/2013 1527   TRIG 209 (H)  03/25/2013 1026   HDL 36 (L) 01/31/2021 1001   HDL 65 10/09/2013 1527   HDL 51 03/25/2013 1026   CHOLHDL 5.6 (H) 01/31/2021 1001   CHOLHDL 3.0 06/17/2008 0625   VLDL 19 06/17/2008 0625   LDLCALC 124 (H) 01/31/2021 1001   LDLCALC 125 (H) 10/09/2013 1527   LDLCALC 148 (H) 03/25/2013 1026   LABVLDL 43 (H) 01/31/2021 1001  Patient Goals/Self-Care Activities patient will:  - take medications as prescribed as evidenced by patient report and record review check glucose DAILY OR IF SYMPTOMATIC, document, and provide at future appointments engage in dietary modifications by HEART HEALTHY/dialysis DIET      Medication Assistance:  tresiba/ozempic obtained through novo nordisk  medication assistance program.  Enrollment ends 11/02/22 Symbicort obtained via [place]&me patient assistance program  Plan: Telephone follow up appointment with care management team member scheduled for:  3 months    Regina Eck, PharmD, BCPS Clinical Pharmacist, [place]  [facility] Phone [phone]

## 2022-05-04 ENCOUNTER — Telehealth: Payer: Self-pay

## 2022-05-04 DIAGNOSIS — E559 Vitamin D deficiency, unspecified: Secondary | ICD-10-CM | POA: Diagnosis not present

## 2022-05-04 DIAGNOSIS — D509 Iron deficiency anemia, unspecified: Secondary | ICD-10-CM | POA: Diagnosis not present

## 2022-05-04 DIAGNOSIS — Z992 Dependence on renal dialysis: Secondary | ICD-10-CM | POA: Diagnosis not present

## 2022-05-04 DIAGNOSIS — N250 Renal osteodystrophy: Secondary | ICD-10-CM | POA: Diagnosis not present

## 2022-05-04 DIAGNOSIS — N186 End stage renal disease: Secondary | ICD-10-CM | POA: Diagnosis not present

## 2022-05-04 DIAGNOSIS — D631 Anemia in chronic kidney disease: Secondary | ICD-10-CM | POA: Diagnosis not present

## 2022-05-04 DIAGNOSIS — N2581 Secondary hyperparathyroidism of renal origin: Secondary | ICD-10-CM | POA: Diagnosis not present

## 2022-05-04 NOTE — Telephone Encounter (Signed)
Joy from [facility] called.  Pt has an upcoming appointment and will talk to the Dr. About her thinning access as well.

## 2022-05-05 ENCOUNTER — Other Ambulatory Visit: Payer: Self-pay | Admitting: *Deleted

## 2022-05-05 DIAGNOSIS — I739 Peripheral vascular disease, unspecified: Secondary | ICD-10-CM

## 2022-05-06 DIAGNOSIS — Z992 Dependence on renal dialysis: Secondary | ICD-10-CM | POA: Diagnosis not present

## 2022-05-06 DIAGNOSIS — N186 End stage renal disease: Secondary | ICD-10-CM | POA: Diagnosis not present

## 2022-05-06 DIAGNOSIS — N2581 Secondary hyperparathyroidism of renal origin: Secondary | ICD-10-CM | POA: Diagnosis not present

## 2022-05-06 DIAGNOSIS — D509 Iron deficiency anemia, unspecified: Secondary | ICD-10-CM | POA: Diagnosis not present

## 2022-05-06 DIAGNOSIS — N250 Renal osteodystrophy: Secondary | ICD-10-CM | POA: Diagnosis not present

## 2022-05-08 ENCOUNTER — Telehealth: Payer: Self-pay | Admitting: Family Medicine

## 2022-05-08 DIAGNOSIS — I480 Paroxysmal atrial fibrillation: Secondary | ICD-10-CM | POA: Diagnosis not present

## 2022-05-08 DIAGNOSIS — I152 Hypertension secondary to endocrine disorders: Secondary | ICD-10-CM | POA: Diagnosis not present

## 2022-05-08 DIAGNOSIS — I2510 Atherosclerotic heart disease of native coronary artery without angina pectoris: Secondary | ICD-10-CM | POA: Diagnosis not present

## 2022-05-08 DIAGNOSIS — E1159 Type 2 diabetes mellitus with other circulatory complications: Secondary | ICD-10-CM | POA: Diagnosis not present

## 2022-05-08 DIAGNOSIS — Z48812 Encounter for surgical aftercare following surgery on the circulatory system: Secondary | ICD-10-CM | POA: Diagnosis not present

## 2022-05-08 DIAGNOSIS — I5033 Acute on chronic diastolic (congestive) heart failure: Secondary | ICD-10-CM | POA: Diagnosis not present

## 2022-05-08 NOTE — Telephone Encounter (Signed)
LMOVM giving VO for adding nursing and giving my direct line if he is needing to add any additional information about pt's O2 level and chest tightness. Forwarding FYI information to pt's PCP.

## 2022-05-09 DIAGNOSIS — N250 Renal osteodystrophy: Secondary | ICD-10-CM | POA: Diagnosis not present

## 2022-05-09 DIAGNOSIS — N2581 Secondary hyperparathyroidism of renal origin: Secondary | ICD-10-CM | POA: Diagnosis not present

## 2022-05-09 DIAGNOSIS — Z992 Dependence on renal dialysis: Secondary | ICD-10-CM | POA: Diagnosis not present

## 2022-05-09 DIAGNOSIS — N186 End stage renal disease: Secondary | ICD-10-CM | POA: Diagnosis not present

## 2022-05-09 DIAGNOSIS — D509 Iron deficiency anemia, unspecified: Secondary | ICD-10-CM | POA: Diagnosis not present

## 2022-05-09 NOTE — Telephone Encounter (Signed)
CALLED PATIENT TO SCHEDULE APPOINTMENT, NO ANSWER, LEFT MESSAGE TO RETURN CALL

## 2022-05-09 NOTE — Telephone Encounter (Signed)
R/C to [facility] about appointment

## 2022-05-09 NOTE — Telephone Encounter (Signed)
Need to see her if chest still tight

## 2022-05-09 NOTE — Telephone Encounter (Signed)
Appointment given for 05/09/22

## 2022-05-10 ENCOUNTER — Encounter: Payer: Self-pay | Admitting: Family Medicine

## 2022-05-10 ENCOUNTER — Ambulatory Visit (INDEPENDENT_AMBULATORY_CARE_PROVIDER_SITE_OTHER): Payer: Medicare Other | Admitting: Family Medicine

## 2022-05-10 ENCOUNTER — Telehealth: Payer: Self-pay | Admitting: Family Medicine

## 2022-05-10 VITALS — BP 164/88 | HR 87 | Temp 97.9°F | Wt 177.4 lb

## 2022-05-10 DIAGNOSIS — I70219 Atherosclerosis of native arteries of extremities with intermittent claudication, unspecified extremity: Secondary | ICD-10-CM

## 2022-05-10 DIAGNOSIS — R0602 Shortness of breath: Secondary | ICD-10-CM | POA: Diagnosis not present

## 2022-05-10 DIAGNOSIS — J441 Chronic obstructive pulmonary disease with (acute) exacerbation: Secondary | ICD-10-CM | POA: Diagnosis not present

## 2022-05-10 DIAGNOSIS — R072 Precordial pain: Secondary | ICD-10-CM

## 2022-05-10 DIAGNOSIS — J449 Chronic obstructive pulmonary disease, unspecified: Secondary | ICD-10-CM | POA: Diagnosis not present

## 2022-05-10 DIAGNOSIS — N186 End stage renal disease: Secondary | ICD-10-CM | POA: Diagnosis not present

## 2022-05-10 MED ORDER — OMEPRAZOLE 20 MG PO CPDR: 20.0000 mg | DELAYED_RELEASE_CAPSULE | Freq: Every day | ORAL | 3 refills | Status: DC

## 2022-05-10 MED ORDER — ATENOLOL 100 MG PO TABS: 100.0000 mg | ORAL_TABLET | Freq: Every day | ORAL | 3 refills | Status: DC

## 2022-05-10 MED ORDER — MOXIFLOXACIN HCL 400 MG PO TABS: 400.0000 mg | ORAL_TABLET | Freq: Every day | ORAL | 0 refills | Status: DC

## 2022-05-10 MED ORDER — PREDNISONE 10 MG PO TABS: ORAL_TABLET | ORAL | 0 refills | Status: DC

## 2022-05-10 MED ORDER — BETAMETHASONE SOD PHOS & ACET 6 (3-3) MG/ML IJ SUSP
6.0000 mg | Freq: Once | INTRAMUSCULAR | Status: AC
  Administered 2022-05-10: 6 mg via INTRAMUSCULAR

## 2022-05-10 NOTE — Progress Notes (Signed)
Subjective:  Patient ID: [name], female    DOB: 05-29-1951  Age: 71 y.o. MRN: [id number]  CC: Chest Pain and Shortness of Breath   HPI [name] presents for can't get no air. Chest pain is substernal. Tight. No radiation. Feels like pressure. Not exertional. Dyspnea not related to exertion. No fever. Scant cough. Nonproductive.      05/10/2022   10:56 AM 04/12/2022   11:16 AM 03/22/2022    4:06 PM  Depression screen PHQ 2/9  Decreased Interest 0 0 1  Down, Depressed, Hopeless 1 0 0  PHQ - 2 Score 1 0 1  Altered sleeping 2 3 3   Tired, decreased energy 1 1 1   Change in appetite 2 0 0  Feeling bad or failure about yourself  0 0 0  Trouble concentrating 0 0 0  Moving slowly or fidgety/restless 0 0 0  Suicidal thoughts 0 0 0  PHQ-9 Score 6 4 5   Difficult doing work/chores Somewhat difficult Not difficult at all Somewhat difficult    History [name] has a past medical history of Acute on chronic diastolic CHF (congestive heart failure) ([place]) (03/05/2015), Anemia, Anxiety, Arthritis, CAD (coronary artery disease), Chronic kidney disease, Chronic neck pain, COPD (chronic obstructive pulmonary disease) ([place]), Degenerative joint disease, Diabetic neuropathy ([place]), Diverticulosis, Esophageal stricture, GERD (gastroesophageal reflux disease), Hyperlipidemia, Hypertension, IDDM (insulin dependent diabetes mellitus), Pericardial effusion (03/06/2015), Peripheral vascular disease ([place]), Stroke ([place]), and Vitamin D deficiency.   She has a past surgical history that includes Bladder surgery; Partial hysterectomy; Shoulder surgery (Right); Cervical spine surgery (2012); Abdominal hysterectomy; Laminectomy (12/29/2011); cardiac stents; Lumbar laminectomy/decompression microdiscectomy (12/29/2011); Back surgery; Cystoscopy with injection (N/A, 06/03/2013); Cardiac catheterization (2003, 2005, 2009); Cataract extraction w/PHACO (Right, 05/04/2014); Cataract extraction w/PHACO (Left, 07/20/2014); AV  fistula placement (Right, 07/20/2015); Cardiac catheterization (N/A, 11/22/2015); Insertion of dialysis catheter (N/A, 11/26/2015); AV fistula placement (Right, 11/26/2015); Fistulogram (Right, 10/20/2016); A/V Fistulagram (N/A, 08/10/2017); PERIPHERAL VASCULAR INTERVENTION (08/10/2017); A/V Fistulagram (N/A, 03/03/2020); PERIPHERAL VASCULAR BALLOON ANGIOPLASTY (03/03/2020); PERIPHERAL VASCULAR BALLOON ANGIOPLASTY (Right, 04/15/2021); LEFT HEART CATH AND CORONARY ANGIOGRAPHY (N/A, 03/06/2022); CORONARY ATHERECTOMY (N/A, 03/08/2022); INTRAVASCULAR IMAGING/OCT (N/A, 03/08/2022); PERIPHERAL VASCULAR BALLOON ANGIOPLASTY (03/14/2022); and Cardioversion (N/A, 04/24/2022).   Her family history includes Cancer in her father; Diabetes in her daughter; Heart disease in an other family member; Hypertension in her daughter, daughter, father, and mother; Pancreatitis in her brother.She reports that she quit smoking about 20 years ago. Her smoking use included cigarettes. She has a 30.00 pack-year smoking history. She has never been exposed to tobacco smoke. She has never used smokeless tobacco. She reports that she does not drink alcohol and does not use drugs.    ROS Review of Systems  Constitutional: Negative.   HENT: Negative.    Eyes:  Negative for visual disturbance.  Respiratory:  Negative for shortness of breath.   Cardiovascular:  Positive for chest pain.  Gastrointestinal:  Positive for nausea (all the time). Negative for abdominal pain.  Musculoskeletal:  Negative for arthralgias.    Objective:  BP (!) 164/88   Pulse 87   Temp 97.9 F (36.6 C)   Wt 177 lb 6.4 oz (80.5 kg)   SpO2 99%   BMI 31.42 kg/m   BP Readings from Last 3 Encounters:  05/12/22 (!) 156/79  05/10/22 (!) 164/88  04/28/22 (!) 155/69    Wt Readings from Last 3 Encounters:  05/12/22 176 lb 14.4 oz (80.2 kg)  05/10/22 177 lb 6.4 oz (80.5 kg)  04/28/22 179  lb 3.7 oz (81.3 kg)     Physical Exam Constitutional:      General: She is  not in acute distress.    Appearance: She is well-developed. She is ill-appearing.  HENT:     Head: Normocephalic.  Cardiovascular:     Rate and Rhythm: Normal rate and regular rhythm.  Pulmonary:     Breath sounds: Normal breath sounds.  Musculoskeletal:        General: Normal range of motion.  Skin:    General: Skin is warm and dry.  Neurological:     Mental Status: She is alert and oriented to person, place, and time.    EKG, NSR, no ischemic changes.     Assessment & Plan:   [name] was seen today for chest pain and shortness of breath.  Diagnoses and all orders for this visit:  Precordial pain -     EKG 12-Lead -     betamethasone acetate-betamethasone sodium phosphate (CELESTONE) injection 6 mg -     CT Chest Wo Contrast; Future  Chronic obstructive pulmonary disease, unspecified COPD type (HCC) -     betamethasone acetate-betamethasone sodium phosphate (CELESTONE) injection 6 mg -     CT Chest Wo Contrast; Future  COPD with acute exacerbation (HCC) -     betamethasone acetate-betamethasone sodium phosphate (CELESTONE) injection 6 mg -     CT Chest Wo Contrast; Future  SOB (shortness of breath) -     betamethasone acetate-betamethasone sodium phosphate (CELESTONE) injection 6 mg -     CT Chest Wo Contrast; Future  End stage renal disease (HCC) -     betamethasone acetate-betamethasone sodium phosphate (CELESTONE) injection 6 mg -     CT Chest Wo Contrast; Future  Other orders -     omeprazole (PRILOSEC) 20 MG capsule; Take 1 capsule (20 mg total) by mouth daily. On an empty stomach -     atenolol (TENORMIN) 100 MG tablet; Take 1 tablet (100 mg total) by mouth at bedtime. -     predniSONE (DELTASONE) 10 MG tablet; Take 5 daily for 3 days followed by 4,3,2 and 1 for 3 days each. -     moxifloxacin (AVELOX) 400 MG tablet; Take 1 tablet (400 mg total) by mouth daily. Take all of these, for infection       I have discontinued [name]'s pantoprazole.  I am also having her start on omeprazole, predniSONE, and moxifloxacin. Additionally, I am having her maintain her calcium acetate, acetaminophen, Carboxymethylcellul-Glycerin (LUBRICATING EYE DROPS OP), OneTouch Ultra, rOPINIRole, FreeStyle Libre 2 Sensor, Ozempic (0.25 or 0.5 MG/DOSE), nitroGLYCERIN, FreeStyle Libre 2 Reader, albuterol, albuterol, rosuvastatin, cetirizine, famotidine, polyethylene glycol, QUEtiapine, gabapentin, hydrOXYzine, apixaban, ticagrelor, ondansetron, diltiazem, lidocaine-prilocaine, dextromethorphan-guaiFENesin, budesonide-formoterol, Tresiba FlexTouch, and atenolol. We administered betamethasone acetate-betamethasone sodium phosphate.  Allergies as of 05/10/2022       Reactions   Azithromycin    Prolonged QT   Ace Inhibitors Cough   Clopidogrel Bisulfate Other (See Comments)   Sick, Headache, "Felt terrible"   Codeine Other (See Comments)   hallucinations   Lisinopril Cough   Penicillins Other (See Comments)   Unknown childhood reaction Did it involve swelling of the face/tongue/throat, SOB, or low BP? Unknown Did it involve sudden or severe rash/hives, skin peeling, or any reaction on the inside of your mouth or nose? Unknown Did you need to seek medical attention at a hospital or doctor's office? Unknown When did it last happen?      childhood allergy If  all above answers are "NO", may proceed with cephalosporin use.        Medication List        Accurate as of May 10, 2022 11:59 PM. If you have any questions, ask your nurse or doctor.          STOP taking these medications    pantoprazole 40 MG tablet Commonly known as: PROTONIX Stopped by: [name], MD       TAKE these medications    acetaminophen 500 MG tablet Commonly known as: TYLENOL Take 1,000 mg by mouth every 6 (six) hours as needed for moderate pain or headache.   albuterol 108 (90 Base) MCG/ACT inhaler Commonly known as: VENTOLIN HFA Inhale 2 puffs into the lungs every 6  (six) hours as needed for wheezing or shortness of breath.   albuterol (2.5 MG/3ML) 0.083% nebulizer solution Commonly known as: PROVENTIL Take 3 mLs (2.5 mg total) by nebulization every 6 (six) hours as needed for wheezing or shortness of breath.   apixaban 5 MG Tabs tablet Commonly known as: ELIQUIS Take 1 tablet (5 mg total) by mouth 2 (two) times daily.   atenolol 100 MG tablet Commonly known as: TENORMIN Take 1 tablet (100 mg total) by mouth at bedtime.   budesonide-formoterol 160-4.5 MCG/ACT inhaler Commonly known as: SYMBICORT Inhale 2 puffs into the lungs 2 (two) times daily.   calcium acetate 667 MG capsule Commonly known as: PHOSLO Take 667-1,334 mg by mouth See admin instructions. Take 1334 mg with meals 3 times daily and 667 mg with snacks   cetirizine 10 MG tablet Commonly known as: ZYRTEC Take 1 tablet (10 mg total) by mouth daily as needed for allergies. What changed: when to take this   dextromethorphan-guaiFENesin 30-600 MG 12hr tablet Commonly known as: MUCINEX DM Take 1 tablet by mouth 2 (two) times daily.   diltiazem 240 MG 24 hr capsule Commonly known as: CARDIZEM CD Take 1 capsule (240 mg total) by mouth daily.   famotidine 20 MG tablet Commonly known as: PEPCID Take 1 tablet (20 mg total) by mouth 2 (two) times daily.   FreeStyle Libre 2 Reader Kerrin Mo USE TO TEST BLOOD SUGAR CONTINUOUSLY   FreeStyle Libre 2 Sensor Misc Use multiple times daily to track glucose to prevent highs and lows as a complication of dialysis Dx:Z99.2, E11.59   gabapentin 400 MG capsule Commonly known as: NEURONTIN Take 2 capsules (800 mg total) by mouth See admin instructions. Take it after HD What changed: additional instructions   hydrOXYzine 50 MG tablet Commonly known as: ATARAX Take 1 tablet (50 mg total) by mouth 3 (three) times daily as needed for anxiety.   lidocaine-prilocaine cream Commonly known as: EMLA Apply 1 application. topically as needed  (dialysis).   LUBRICATING EYE DROPS OP Place 1 drop into both eyes daily as needed (dry eyes).   moxifloxacin 400 MG tablet Commonly known as: Avelox Take 1 tablet (400 mg total) by mouth daily. Take all of these, for infection Started by: [name], MD   nitroGLYCERIN 0.4 MG SL tablet Commonly known as: NITROSTAT Place 1 tablet (0.4 mg total) under the tongue every 5 (five) minutes as needed for chest pain.   omeprazole 20 MG capsule Commonly known as: PRILOSEC Take 1 capsule (20 mg total) by mouth daily. On an empty stomach Started by: [name], MD   ondansetron 4 MG tablet Commonly known as: ZOFRAN TAKE 1 TABLET BY MOUTH EVERY 8 HOURS AS NEEDED FOR NAUSEA AND VOMITING  OneTouch Ultra test strip Generic drug: glucose blood Use to test blood sugar 3 times daily as directed. DX: E11.9   Ozempic (0.25 or 0.5 MG/DOSE) 2 MG/1.5ML Sopn Generic drug: Semaglutide(0.25 or 0.5MG /DOS) Inject 0.5 mg into the skin every Friday.   polyethylene glycol 17 g packet Commonly known as: MIRALAX / GLYCOLAX Take 17 g by mouth daily as needed for moderate constipation or mild constipation.   predniSONE 10 MG tablet Commonly known as: DELTASONE Take 5 daily for 3 days followed by 4,3,2 and 1 for 3 days each. Started by: [name], MD   QUEtiapine 200 MG tablet Commonly known as: SEROQUEL Take 1 tablet (200 mg total) by mouth at bedtime. For sleep and for anxiety   rOPINIRole 1 MG tablet Commonly known as: REQUIP Take 1 tablet (1 mg total) by mouth at bedtime. For leg cramps   rosuvastatin 5 MG tablet Commonly known as: Crestor Take 1 tablet (5 mg total) by mouth daily. For cholesterol What changed: when to take this   ticagrelor 90 MG Tabs tablet Commonly known as: BRILINTA Take 1 tablet (90 mg total) by mouth 2 (two) times daily.   Tyler Aas FlexTouch 100 UNIT/ML FlexTouch Pen Generic drug: insulin degludec Inject 20 Units into the skin at bedtime. Via [employer]  patient assistance program         Follow-up: Return in about 1 week (around 05/17/2022), or if symptoms worsen or fail to improve.  [name], M.D.

## 2022-05-10 NOTE — Progress Notes (Signed)
HISTORY AND PHYSICAL     CC:  follow up. Requesting Provider:  [name], MD  HPI: This is a 71 y.o. female who is here today for follow up for PAD.  She was seen in January 2021 by Dr. Scot Dock for PAD.  She was having claudication in both calves for many years and was equal bilaterally.  She was not having rest pain or non healing wounds.  She was also having pain in her legs while on HD.    She was seen back later in the year.  Her femoral pulses were not palpable and she was sent for CTA.  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  She is not a good candidate for either an endovascular procedure or open surgery given her diffuse calcific disease with small arteries.    When she was seen by Dr. Scot Dock in May 2022, he felt that some of her pain was due to DDD and he encouraged her to stay as active as possible and f/u in one year.    She also has hx of ESRD T/T/S. Dialysis access hx: -Right RC AVF 07/20/2015 Dr. Scot Dock -ligation of right RC AVF 11/26/2015 Dr. Scot Dock -right BC AVF 11/26/2015 Dr. Scot Dock -multiple fistulograms right arm fistula  Pt was seen by Dr. Donnetta Hutching in April 2023 for persistent bleeding from the RUA AVF.  She had been initiated on Brilinta and Eliquis due to an acute coronary event.  She had fistulogram 03/14/2022 and there was no evidence of problems with the arteriovenous anastomosis or the cephalic vein itself.  There was a stenosis at the junction of the cephalic vein in the subclavian vein and this was treated successfully with balloon angioplasty.  Dr. Donnetta Hutching felt that the prolonged bleeding was due to [facility] as well as she was anticoagulated with Eliquis.  He felt she would have ongoing issues with this until the Brilinta would be able to be discontinued.     The pt returns today for follow up.  She states that she is still having bleeding issues from her fistula.  She is still on Eliquis and Brilinta.  Looking at  cardiology notes, they want her to be on the [place] for 3 months and this was started in April.    She states she has burning in both of her legs.  She does not have pain in her feet at night but she state that her legs feel restless at night.  She does not have any wounds or rest pain.  She is not able to walk in [employer] like she could previously.  She does continue to have back pain.    The pt is on a statin for cholesterol management.    The pt is not on an aspirin.    Other AC:  Eliquis The pt is on BB for hypertension.  The pt does  have diabetes. Tobacco hx:  former    Past Medical History:  Diagnosis Date   Acute on chronic diastolic CHF (congestive heart failure) ([place]) 03/05/2015   Grade 2. EF 60-65%   Anemia    hx of   Anxiety    Arthritis    CAD (coronary artery disease)    s/p Stenting in 2005;  [place] 7/09: Vigorous LV function, 2-3+ MR, RI 40%, RI stent patent, proximal-mid RCA 40-50%;  Echo 7/09:  EF 55-65%, trivial MR;  [place] 11/18/12: EF 66%, normal LV wall motion, mild to moderate anterior ischemia -  reviewed by [facility] and felt to rep breast atten (low risk);  Echo 6/14: mild LVH, EF 55-65%, Gr 2 DD, PASP 44, trivial eff    Chronic kidney disease    CREATININE IS UP--GOING TO KIDNEY MD    Chronic neck pain    COPD (chronic obstructive pulmonary disease) (HCC)    Degenerative joint disease    left shoulder   Diabetic neuropathy (HCC)    Diverticulosis    Esophageal stricture    GERD (gastroesophageal reflux disease)    Hyperlipidemia    Hypertension    IDDM (insulin dependent diabetes mellitus)    Pericardial effusion 03/06/2015   Very small per echo ; pleural nodules seen on CT chest.   Peripheral vascular disease ([place])    Stroke ([place])    "mini- years ago"   Vitamin D deficiency     Past Surgical History:  Procedure Laterality Date   A/V FISTULAGRAM N/A 08/10/2017   Procedure: A/V Fistulagram - Right Arm;  Surgeon: Elam Dutch, MD;  Location: [place] [facility]  [facility];  Service: Cardiovascular;  Laterality: N/A;   A/V FISTULAGRAM N/A 03/03/2020   Procedure: A/V FISTULAGRAM - Right Arm;  Surgeon: Marty Heck, MD;  Location: [place] [facility];  Service: Cardiovascular;  Laterality: N/A;   ABDOMINAL HYSTERECTOMY     AV FISTULA PLACEMENT Right 07/20/2015   Procedure: RADIAL CEPHALIC ARTERIOVENOUS FISTULA CREATION RIGHT ARM;  Surgeon: Angelia Mould, MD;  Location: [place];  Service: Vascular;  Laterality: Right;   AV FISTULA PLACEMENT Right 11/26/2015   Procedure:  Creation of RIGHT BRACHIO-CEPHALIC arteriovenous fistula;  Surgeon: Angelia Mould, MD;  Location: [facility] OR;  Service: Vascular;  Laterality: Right;   BACK SURGERY     2007 & 2013   [place]  2003, 2005, 2009   1 stent   cardiac stents     CARDIOVERSION N/A 04/24/2022   Procedure: CARDIOVERSION;  Surgeon: Fay Records, MD;  Location: [facility] ENDOSCOPY;  Service: Cardiovascular;  Laterality: N/A;   CATARACT EXTRACTION W/PHACO Right 05/04/2014   Procedure: CATARACT EXTRACTION PHACO AND INTRAOCULAR LENS PLACEMENT ([place]);  Surgeon: Williams Che, MD;  Location: [facility];  Service: Ophthalmology;  Laterality: Right;  CDE 1.47   CATARACT EXTRACTION W/PHACO Left 07/20/2014   Procedure: CATARACT EXTRACTION WITH PHACO AND INTRAOCULAR LENS PLACEMENT LEFT EYE CDE=1.79;  Surgeon: Williams Che, MD;  Location: [facility];  Service: Ophthalmology;  Laterality: Left;   CERVICAL SPINE SURGERY  2012   8 screws   CORONARY ATHERECTOMY N/A 03/08/2022   Procedure: CORONARY ATHERECTOMY;  Surgeon: Sherren Mocha, MD;  Location: [place] [facility];  Service: Cardiovascular;  Laterality: N/A;   CYSTOSCOPY WITH INJECTION N/A 06/03/2013   Procedure: MACROPLASTIQUE  INJECTION ;  Surgeon: Malka So, MD;  Location: [facility];  Service: Urology;  Laterality: N/A;   FISTULOGRAM Right 10/20/2016   Procedure: FISTULOGRAM WITH POSSIBLE INTERVENTION;  Surgeon: [name],  MD;  Location: [facility];  Service: Vascular;  Laterality: Right;   INSERTION OF DIALYSIS CATHETER N/A 11/26/2015   Procedure: INSERTION OF DIALYSIS CATHETER;  Surgeon: Angelia Mould, MD;  Location: [place];  Service: Vascular;  Laterality: N/A;   INTRAVASCULAR IMAGING/OCT N/A 03/08/2022   Procedure: INTRAVASCULAR IMAGING/OCT;  Surgeon: Sherren Mocha, MD;  Location: [place] [facility];  Service: Cardiovascular;  Laterality: N/A;   LAMINECTOMY  12/29/2011   LEFT HEART CATH AND CORONARY ANGIOGRAPHY N/A  03/06/2022   Procedure: LEFT HEART CATH AND CORONARY ANGIOGRAPHY;  Surgeon: Nelva Bush, MD;  Location: [place] [facility];  Service: Cardiovascular;  Laterality: N/A;   LUMBAR LAMINECTOMY/DECOMPRESSION MICRODISCECTOMY  12/29/2011   Procedure: LUMBAR LAMINECTOMY/DECOMPRESSION MICRODISCECTOMY;  Surgeon: Floyce Stakes, MD;  Location: [place] NEURO ORS;  Service: Neurosurgery;  Laterality: N/A;  Lumbar Two through Lumbar Five Laminectomies/Cellsaver   PARTIAL HYSTERECTOMY     PERIPHERAL VASCULAR BALLOON ANGIOPLASTY  03/03/2020   Procedure: PERIPHERAL VASCULAR BALLOON ANGIOPLASTY;  Surgeon: Marty Heck, MD;  Location: [place] [facility];  Service: Cardiovascular;;  Rt arm fistula   PERIPHERAL VASCULAR BALLOON ANGIOPLASTY Right 04/15/2021   Procedure: PERIPHERAL VASCULAR BALLOON ANGIOPLASTY;  Surgeon: Angelia Mould, MD;  Location: [place] [facility];  Service: Cardiovascular;  Laterality: Right;  arm fistula   PERIPHERAL VASCULAR BALLOON ANGIOPLASTY  03/14/2022   Procedure: PERIPHERAL VASCULAR BALLOON ANGIOPLASTY;  Surgeon: Broadus John, MD;  Location: [place] [facility];  Service: Cardiovascular;;  right arm AVF   PERIPHERAL VASCULAR CATHETERIZATION N/A 11/22/2015   Procedure: Fistulagram;  Surgeon: Angelia Mould, MD;  Location: [place] [facility];  Service: Cardiovascular;  Laterality: N/A;   PERIPHERAL VASCULAR INTERVENTION  08/10/2017   Procedure: PERIPHERAL VASCULAR  INTERVENTION;  Surgeon: Elam Dutch, MD;  Location: [place] [facility];  Service: Cardiovascular;;   SHOULDER SURGERY Right    Rotator cuff    Allergies  Allergen Reactions   Azithromycin     Prolonged QT   Ace Inhibitors Cough   Clopidogrel Bisulfate Other (See Comments)    Sick, Headache, "Felt terrible"   Codeine Other (See Comments)    hallucinations   Lisinopril Cough   Penicillins Other (See Comments)    Unknown childhood reaction Did it involve swelling of the face/tongue/throat, SOB, or low BP? Unknown Did it involve sudden or severe rash/hives, skin peeling, or any reaction on the inside of your mouth or nose? Unknown Did you need to seek medical attention at a hospital or doctor's office? Unknown When did it last happen?      childhood allergy If all above answers are "NO", may proceed with cephalosporin use.      Current Outpatient Medications  Medication Sig Dispense Refill   acetaminophen (TYLENOL) 500 MG tablet Take 1,000 mg by mouth every 6 (six) hours as needed for moderate pain or headache.     albuterol (PROVENTIL) (2.5 MG/3ML) 0.083% nebulizer solution Take 3 mLs (2.5 mg total) by nebulization every 6 (six) hours as needed for wheezing or shortness of breath. 75 mL 12   albuterol (VENTOLIN HFA) 108 (90 Base) MCG/ACT inhaler Inhale 2 puffs into the lungs every 6 (six) hours as needed for wheezing or shortness of breath. 1 each 2   apixaban (ELIQUIS) 5 MG TABS tablet Take 1 tablet (5 mg total) by mouth 2 (two) times daily. 180 tablet 3   atenolol (TENORMIN) 100 MG tablet Take 1 tablet (100 mg total) by mouth at bedtime. 30 tablet 0   budesonide-formoterol (SYMBICORT) 160-4.5 MCG/ACT inhaler Inhale 2 puffs into the lungs 2 (two) times daily. 3 each 4   calcium acetate (PHOSLO) 667 MG capsule Take 667-1,334 mg by mouth See admin instructions. Take 1334 mg with meals 3 times daily and 667 mg with snacks     Carboxymethylcellul-Glycerin (LUBRICATING EYE DROPS OP)  Place 1 drop into both eyes daily as needed (dry eyes).     cetirizine (ZYRTEC) 10 MG tablet Take 1 tablet (10  mg total) by mouth daily as needed for allergies. (Patient taking differently: Take 10 mg by mouth daily.) 90 tablet 1   Continuous Blood Gluc Receiver (FREESTYLE LIBRE 2 READER) DEVI USE TO TEST BLOOD SUGAR CONTINUOUSLY 1 each 2   Continuous Blood Gluc Sensor (FREESTYLE LIBRE 2 SENSOR) MISC Use multiple times daily to track glucose to prevent highs and lows as a complication of dialysis Dx:Z99.2, E11.59 2 each 12   dextromethorphan-guaiFENesin (MUCINEX DM) 30-600 MG 12hr tablet Take 1 tablet by mouth 2 (two) times daily. 10 tablet 0   diltiazem (CARDIZEM CD) 240 MG 24 hr capsule Take 1 capsule (240 mg total) by mouth daily. 30 capsule 0   famotidine (PEPCID) 20 MG tablet Take 1 tablet (20 mg total) by mouth 2 (two) times daily. (Patient not taking: Reported on 04/25/2022) 30 tablet 0   gabapentin (NEURONTIN) 400 MG capsule Take 2 capsules (800 mg total) by mouth See admin instructions. Take it after HD (Patient taking differently: Take 800 mg by mouth See admin instructions. Take after dialysis) 30 capsule 1   glucose blood (ONETOUCH ULTRA) test strip Use to test blood sugar 3 times daily as directed. DX: E11.9 300 each 12   hydrOXYzine (ATARAX) 50 MG tablet Take 1 tablet (50 mg total) by mouth 3 (three) times daily as needed for anxiety. 90 tablet 2   insulin degludec (TRESIBA FLEXTOUCH) 100 UNIT/ML FlexTouch Pen Inject 20 Units into the skin at bedtime. Via novo nordisk patient assistance program     lidocaine-prilocaine (EMLA) cream Apply 1 application. topically as needed (dialysis).     nitroGLYCERIN (NITROSTAT) 0.4 MG SL tablet Place 1 tablet (0.4 mg total) under the tongue every 5 (five) minutes as needed for chest pain. 25 tablet 11   ondansetron (ZOFRAN) 4 MG tablet TAKE 1 TABLET BY MOUTH EVERY 8 HOURS AS NEEDED FOR NAUSEA AND VOMITING 20 tablet 0   pantoprazole (PROTONIX) 40 MG  tablet Take 1 tablet (40 mg total) by mouth 2 (two) times daily. 60 tablet 2   polyethylene glycol (MIRALAX / GLYCOLAX) 17 g packet Take 17 g by mouth daily as needed for moderate constipation or mild constipation. 14 each 0   QUEtiapine (SEROQUEL) 200 MG tablet Take 1 tablet (200 mg total) by mouth at bedtime. For sleep and for anxiety 90 tablet 1   rOPINIRole (REQUIP) 1 MG tablet Take 1 tablet (1 mg total) by mouth at bedtime. For leg cramps 90 tablet 3   rosuvastatin (CRESTOR) 5 MG tablet Take 1 tablet (5 mg total) by mouth daily. For cholesterol (Patient taking differently: Take 5 mg by mouth at bedtime. For cholesterol) 90 tablet 2   Semaglutide,0.25 or 0.5MG /DOS, (OZEMPIC, 0.25 OR 0.5 MG/DOSE,) 2 MG/1.5ML SOPN Inject 0.5 mg into the skin every Friday.     ticagrelor (BRILINTA) 90 MG TABS tablet Take 1 tablet (90 mg total) by mouth 2 (two) times daily. 60 tablet 1   No current facility-administered medications for this visit.    Family History  Problem Relation Age of Onset   Heart disease Other    Hypertension Father    Cancer Father    Hypertension Mother    Hypertension Daughter    Pancreatitis Brother    Hypertension Daughter    Diabetes Daughter     Social History   Socioeconomic History   Marital status: Widowed    Spouse name: Not on file   Number of children: 2   Years of education: GED  Highest education level: GED or equivalent  Occupational History   Occupation: Disabled  Tobacco Use   Smoking status: Former    Packs/day: 1.00    Years: 30.00    Pack years: 30.00    Types: Cigarettes    Quit date: 12/04/2001    Years since quitting: 20.4   Smokeless tobacco: Never  Vaping Use   Vaping Use: Never used  Substance and Sexual Activity   Alcohol use: No    Alcohol/week: 6.0 standard drinks    Types: 6 Cans of beer per week   Drug use: No   Sexual activity: Not Currently    Birth control/protection: Surgical  Other Topics Concern   Not on file  Social  History Narrative   Lives in [place] with mother   Daughters live nearby   Social Determinants of Health   Financial Resource Strain: Low Risk    Difficulty of Paying Living Expenses: Not hard at all  Food Insecurity: No Food Insecurity   Worried About Charity fundraiser in the Last Year: Never true   Arboriculturist in the Last Year: Never true  Transportation Needs: No Transportation Needs   Lack of Transportation (Medical): No   Lack of Transportation (Non-Medical): No  Physical Activity: Inactive   Days of Exercise per Week: 0 days   Minutes of Exercise per Session: 0 min  Stress: No Stress Concern Present   Feeling of Stress : Not at all  Social Connections: Moderately Isolated   Frequency of Communication with Friends and Family: More than three times a week   Frequency of Social Gatherings with Friends and Family: More than three times a week   Attends Religious Services: More than 4 times per year   Active Member of [employer] or Organizations: No   Attends Archivist Meetings: Never   Marital Status: Widowed  Human resources officer Violence: Not At Risk   Fear of Current or Ex-Partner: No   Emotionally Abused: No   Physically Abused: No   Sexually Abused: No     REVIEW OF SYSTEMS:   [X]  denotes positive finding, [ ]  denotes negative finding Cardiac  Comments:  Chest pain or chest pressure: x   Shortness of breath upon exertion: x   Short of breath when lying flat: x   Irregular heart rhythm: x       Vascular    Pain in calf, thigh, or hip brought on by ambulation: x   Pain in feet at night that wakes you up from your sleep:  x   Blood clot in your veins:    Leg swelling:  x       Pulmonary    Oxygen at home:    Productive cough:     Wheezing:         Neurologic    Sudden weakness in arms or legs:  x   Sudden numbness in arms or legs:  x   Sudden onset of difficulty speaking or slurred speech:    Temporary loss of vision in one eye:     Problems  with dizziness:         Gastrointestinal    Blood in stool:     Vomited blood:         Genitourinary    Burning when urinating:     Blood in urine:        Psychiatric    Major depression:         Hematologic  Bleeding problems: x   Problems with blood clotting too easily:        Skin    Rashes or ulcers:        Constitutional    Fever or chills:      PHYSICAL EXAMINATION:  Today's Vitals   05/12/22 1431  BP: (!) 156/79  Pulse: 84  Resp: 20  Temp: 97.8 F (36.6 C)  TempSrc: Temporal  SpO2: 99%  Weight: 176 lb 14.4 oz (80.2 kg)  Height: 5\' 3"  (1.6 m)  PainSc: 10-Worst pain ever  PainLoc: Leg   Body mass index is 31.34 kg/m.   General:  WDWN in NAD; vital signs documented above Gait: Not observed HENT: WNL, normocephalic Pulmonary: normal non-labored breathing , without wheezing Cardiac: irregular HR, without carotid bruits Abdomen: soft, NT, no masses; aortic pulse is not palpable Skin: without rashes Vascular Exam/Pulses:  Right Left  Radial 2+ (normal) 2+ (normal)  Femoral 2+ (normal) 2+ (normal)  DP 1+ palpable monophasic  PT monophasic monophasic  Peroneal monophasic monophasic   Extremities: without ischemic changes, without Gangrene , without cellulitis; without open wounds; motor and sensory are in tact in both feet. Musculoskeletal: no muscle wasting or atrophy  Neurologic: A&O X 3 Psychiatric:  The pt has Normal affect.   Non-Invasive Vascular Imaging:   ABI's/TBI's on 05/12/2022: Right:  0.85/0.32 - Great toe pressure: 53 Left:  0.92/absent - Great toe pressure: absent  Previous ABI's/TBI's on 04/13/2021: Right:  0.99/0.54 - Great toe pressure: 76 Left:  0.69/0.32 - Great toe pressure:  45    ASSESSMENT/PLAN:: 71 y.o. female here for follow up for PAD   PAD -pt ABI slightly decreases on the right and improved on the left but absent toe pressure on the left.  She describes her legs as burning when she walks not frank pain in her  calves or thighs.  She does have low back pain while walking.  She does not have rest pain as she describes her legs as more restless.   -per Dr. Nicole Cella last note, her previous CT scan she really does not have any great options either from an endovascular standpoint or from an open standpoint given that she has small calcified arteries.  Her back issues are most likely contributing to her BLE issues.   -continue statin -pt will f/u in 6 months with repeat ABI and see Dr. Scot Dock.  ESRD -pt with continued bleeding from fistula.  She recently saw Dr. Donnetta Hutching and it was felt that she has prolonged bleeding due to being on Brilinta and Eliquis.  Per Cardiology note, pt needs to be on Brilinta for 3 months and then aspirin.  H edid not feel this would improve until she can come off of the Brilinta.  He felt one option would be to place a TDC until off of the Brilinta but would like to avoid a catheter if possible.  There is an excellent thrill in the fistula.    Leontine Locket, [facility] [phone]  Clinic MD:   Donzetta Matters on call MD

## 2022-05-11 DIAGNOSIS — D509 Iron deficiency anemia, unspecified: Secondary | ICD-10-CM | POA: Diagnosis not present

## 2022-05-11 DIAGNOSIS — Z992 Dependence on renal dialysis: Secondary | ICD-10-CM | POA: Diagnosis not present

## 2022-05-11 DIAGNOSIS — N2581 Secondary hyperparathyroidism of renal origin: Secondary | ICD-10-CM | POA: Diagnosis not present

## 2022-05-11 DIAGNOSIS — N250 Renal osteodystrophy: Secondary | ICD-10-CM | POA: Diagnosis not present

## 2022-05-11 DIAGNOSIS — N186 End stage renal disease: Secondary | ICD-10-CM | POA: Diagnosis not present

## 2022-05-11 NOTE — Telephone Encounter (Signed)
Patient returning call. Please call back

## 2022-05-12 ENCOUNTER — Telehealth: Payer: Self-pay | Admitting: *Deleted

## 2022-05-12 ENCOUNTER — Encounter: Payer: Self-pay | Admitting: Physician Assistant

## 2022-05-12 ENCOUNTER — Ambulatory Visit (HOSPITAL_COMMUNITY)
Admission: RE | Admit: 2022-05-12 | Discharge: 2022-05-12 | Disposition: A | Discharge status: Home / Self Care | Payer: Medicare Other | Source: Ambulatory Visit | Attending: Physician Assistant | Admitting: Physician Assistant

## 2022-05-12 ENCOUNTER — Ambulatory Visit (INDEPENDENT_AMBULATORY_CARE_PROVIDER_SITE_OTHER): Payer: Medicare Other | Admitting: Physician Assistant

## 2022-05-12 VITALS — BP 156/79 | HR 84 | Temp 97.8°F | Resp 20 | Ht 63.0 in | Wt 176.9 lb

## 2022-05-12 DIAGNOSIS — N186 End stage renal disease: Secondary | ICD-10-CM

## 2022-05-12 DIAGNOSIS — Z992 Dependence on renal dialysis: Secondary | ICD-10-CM | POA: Diagnosis not present

## 2022-05-12 DIAGNOSIS — I70219 Atherosclerosis of native arteries of extremities with intermittent claudication, unspecified extremity: Secondary | ICD-10-CM | POA: Diagnosis not present

## 2022-05-12 DIAGNOSIS — I739 Peripheral vascular disease, unspecified: Secondary | ICD-10-CM | POA: Diagnosis not present

## 2022-05-12 DIAGNOSIS — I5033 Acute on chronic diastolic (congestive) heart failure: Secondary | ICD-10-CM | POA: Diagnosis not present

## 2022-05-12 DIAGNOSIS — I152 Hypertension secondary to endocrine disorders: Secondary | ICD-10-CM | POA: Diagnosis not present

## 2022-05-12 DIAGNOSIS — I2510 Atherosclerotic heart disease of native coronary artery without angina pectoris: Secondary | ICD-10-CM | POA: Diagnosis not present

## 2022-05-12 DIAGNOSIS — E1159 Type 2 diabetes mellitus with other circulatory complications: Secondary | ICD-10-CM | POA: Diagnosis not present

## 2022-05-12 DIAGNOSIS — I480 Paroxysmal atrial fibrillation: Secondary | ICD-10-CM | POA: Diagnosis not present

## 2022-05-12 DIAGNOSIS — Z48812 Encounter for surgical aftercare following surgery on the circulatory system: Secondary | ICD-10-CM | POA: Diagnosis not present

## 2022-05-12 NOTE — Telephone Encounter (Signed)
TC from [place] w/ Alvis Lemmings Seeing pt this am. Pt was put on Prednisone dose pack Wednesday for 2 wks BS last night >500, this morning fasting was 365 Wanting to know if pt could be placed on a short acting insulin Please advise and call [facility] nurse back

## 2022-05-12 NOTE — Telephone Encounter (Signed)
Joaquin Courts aware of sliding scale recommendation and would like this called into [facility]. Can add regular insulin before meals while taking steroids   < 150: 0 units  150-199:1 unit  200-249: 2 units  250-299: 3 units  300-349: 4 units  >/= 350: 5 units

## 2022-05-13 ENCOUNTER — Encounter: Payer: Self-pay | Admitting: Family Medicine

## 2022-05-13 DIAGNOSIS — N186 End stage renal disease: Secondary | ICD-10-CM | POA: Diagnosis not present

## 2022-05-13 DIAGNOSIS — D509 Iron deficiency anemia, unspecified: Secondary | ICD-10-CM | POA: Diagnosis not present

## 2022-05-13 DIAGNOSIS — N2581 Secondary hyperparathyroidism of renal origin: Secondary | ICD-10-CM | POA: Diagnosis not present

## 2022-05-13 DIAGNOSIS — Z992 Dependence on renal dialysis: Secondary | ICD-10-CM | POA: Diagnosis not present

## 2022-05-13 DIAGNOSIS — N250 Renal osteodystrophy: Secondary | ICD-10-CM | POA: Diagnosis not present

## 2022-05-15 ENCOUNTER — Other Ambulatory Visit: Payer: Self-pay | Admitting: Family Medicine

## 2022-05-15 ENCOUNTER — Ambulatory Visit (HOSPITAL_COMMUNITY)
Admission: RE | Admit: 2022-05-15 | Discharge: 2022-05-15 | Disposition: A | Discharge status: Home / Self Care | Payer: Medicare Other | Source: Ambulatory Visit | Attending: Family Medicine | Admitting: Family Medicine

## 2022-05-15 DIAGNOSIS — R0602 Shortness of breath: Secondary | ICD-10-CM | POA: Diagnosis not present

## 2022-05-15 DIAGNOSIS — J441 Chronic obstructive pulmonary disease with (acute) exacerbation: Secondary | ICD-10-CM | POA: Diagnosis not present

## 2022-05-15 DIAGNOSIS — N186 End stage renal disease: Secondary | ICD-10-CM | POA: Diagnosis not present

## 2022-05-15 DIAGNOSIS — J449 Chronic obstructive pulmonary disease, unspecified: Secondary | ICD-10-CM | POA: Insufficient documentation

## 2022-05-15 DIAGNOSIS — R072 Precordial pain: Secondary | ICD-10-CM | POA: Insufficient documentation

## 2022-05-15 DIAGNOSIS — J90 Pleural effusion, not elsewhere classified: Secondary | ICD-10-CM | POA: Diagnosis not present

## 2022-05-15 DIAGNOSIS — J984 Other disorders of lung: Secondary | ICD-10-CM | POA: Diagnosis not present

## 2022-05-15 DIAGNOSIS — R918 Other nonspecific abnormal finding of lung field: Secondary | ICD-10-CM | POA: Diagnosis not present

## 2022-05-15 MED ORDER — INSULIN ASPART 100 UNIT/ML IJ SOLN: INTRAMUSCULAR | 99 refills | Status: DC

## 2022-05-15 NOTE — Telephone Encounter (Signed)
Please let the patient know that I sent their prescription to their pharmacy. Thanks, WS 

## 2022-05-16 DIAGNOSIS — N186 End stage renal disease: Secondary | ICD-10-CM | POA: Diagnosis not present

## 2022-05-16 DIAGNOSIS — Z992 Dependence on renal dialysis: Secondary | ICD-10-CM | POA: Diagnosis not present

## 2022-05-16 DIAGNOSIS — D509 Iron deficiency anemia, unspecified: Secondary | ICD-10-CM | POA: Diagnosis not present

## 2022-05-16 DIAGNOSIS — N2581 Secondary hyperparathyroidism of renal origin: Secondary | ICD-10-CM | POA: Diagnosis not present

## 2022-05-16 DIAGNOSIS — N250 Renal osteodystrophy: Secondary | ICD-10-CM | POA: Diagnosis not present

## 2022-05-16 NOTE — Progress Notes (Unsigned)
Cardiology Office Note   Date:  05/17/2022   ID:  [name], DOB [birth year], MRN [id number]  PCP:  [name], MD  Cardiologist:   Minus Breeding, MD   Chief Complaint  Patient presents with   Shortness of Breath        History of Present Illness: [name] is a 71 y.o. female who presents for follow up of CAD, diastolic heart failure with diastolic dysfunction and atrial fib.   She was in the hospital with a NSTEMI.  She had PCI as below.  She had atrial fib and was treated with DOAC.   Since I saw her in the hospital with her non-STEMI she has been back again with atrial fibrillation rapid rate and I reviewed these records for this visit.  She had some excess volume and required an additional dialysis.  She had Cardizem to control her rate.  This was IV.  Of note she has had some bleeding issues with her dialysis access and had to hold her Eliquis for several days.  She actually restarted it about 4 days ago.  She had 2 hospitalizations in April.  Since I last saw her she was in the hospital again in May with respiratory failure.  I reviewed these records for this visit.  She had multifocular pneumonia.  She PAF but ws in NSR at the end of the hospitalization.  At discharge Torsemide was discontinued.    She is back in atrial fibrillation.  She says she has been feeling better compared to prior to hospitalization with her pneumonia.  I do see a CT that was done earlier this week that demonstrated the pneumonia is resolving and suggest follow-up in 6 months.  She has just been significantly fatigued.  Its not clear that this is in keeping with recurrent fibrillation although it may be contributing.  She is anemic.  Her biggest complaint has been that she just cannot stop the bleeding from her dialysis fistula after dialysis.  She has not had any chest pressure, neck or arm discomfort.  She has not had any presyncope or syncope.  She has felt a few palpitations but would  not really know otherwise she was in fibrillation.   Past Medical History:  Diagnosis Date   Acute on chronic diastolic CHF (congestive heart failure) ([place]) 03/05/2015   Grade 2. EF 60-65%   Anemia    hx of   Anxiety    Arthritis    CAD (coronary artery disease)    s/p Stenting in 2005;  [place] 7/09: Vigorous LV function, 2-3+ MR, RI 40%, RI stent patent, proximal-mid RCA 40-50%;  Echo 7/09:  EF 55-65%, trivial MR;  [place] 11/18/12: EF 66%, normal LV wall motion, mild to moderate anterior ischemia - reviewed by [facility] and felt to rep breast atten (low risk);  Echo 6/14: mild LVH, EF 55-65%, Gr 2 DD, PASP 44, trivial eff    Chronic kidney disease    CREATININE IS UP--GOING TO KIDNEY MD    Chronic neck pain    COPD (chronic obstructive pulmonary disease) (HCC)    Degenerative joint disease    left shoulder   Diabetic neuropathy (HCC)    Diverticulosis    Esophageal stricture    GERD (gastroesophageal reflux disease)    Hyperlipidemia    Hypertension    IDDM (insulin dependent diabetes mellitus)    Pericardial effusion 03/06/2015   Very small per echo ; pleural nodules seen on CT chest.  Peripheral vascular disease ([place])    Stroke ([place])    "mini- years ago"   Vitamin D deficiency     Past Surgical History:  Procedure Laterality Date   A/V FISTULAGRAM N/A 08/10/2017   Procedure: A/V Fistulagram - Right Arm;  Surgeon: Elam Dutch, MD;  Location: [place] [facility];  Service: Cardiovascular;  Laterality: N/A;   A/V FISTULAGRAM N/A 03/03/2020   Procedure: A/V FISTULAGRAM - Right Arm;  Surgeon: Marty Heck, MD;  Location: [place] [facility];  Service: Cardiovascular;  Laterality: N/A;   ABDOMINAL HYSTERECTOMY     AV FISTULA PLACEMENT Right 07/20/2015   Procedure: RADIAL CEPHALIC ARTERIOVENOUS FISTULA CREATION RIGHT ARM;  Surgeon: Angelia Mould, MD;  Location: [place];  Service: Vascular;  Laterality: Right;   AV FISTULA PLACEMENT Right 11/26/2015   Procedure:  Creation of  RIGHT BRACHIO-CEPHALIC arteriovenous fistula;  Surgeon: Angelia Mould, MD;  Location: [facility] OR;  Service: Vascular;  Laterality: Right;   BACK SURGERY     2007 & 2013   [place]  2003, 2005, 2009   1 stent   cardiac stents     CARDIOVERSION N/A 04/24/2022   Procedure: CARDIOVERSION;  Surgeon: Fay Records, MD;  Location: [facility] ENDOSCOPY;  Service: Cardiovascular;  Laterality: N/A;   CATARACT EXTRACTION W/PHACO Right 05/04/2014   Procedure: CATARACT EXTRACTION PHACO AND INTRAOCULAR LENS PLACEMENT ([place]);  Surgeon: Williams Che, MD;  Location: [facility];  Service: Ophthalmology;  Laterality: Right;  CDE 1.47   CATARACT EXTRACTION W/PHACO Left 07/20/2014   Procedure: CATARACT EXTRACTION WITH PHACO AND INTRAOCULAR LENS PLACEMENT LEFT EYE CDE=1.79;  Surgeon: Williams Che, MD;  Location: [facility];  Service: Ophthalmology;  Laterality: Left;   CERVICAL SPINE SURGERY  2012   8 screws   CORONARY ATHERECTOMY N/A 03/08/2022   Procedure: CORONARY ATHERECTOMY;  Surgeon: Sherren Mocha, MD;  Location: [place] [facility];  Service: Cardiovascular;  Laterality: N/A;   CYSTOSCOPY WITH INJECTION N/A 06/03/2013   Procedure: MACROPLASTIQUE  INJECTION ;  Surgeon: Malka So, MD;  Location: [facility];  Service: Urology;  Laterality: N/A;   FISTULOGRAM Right 10/20/2016   Procedure: FISTULOGRAM WITH POSSIBLE INTERVENTION;  Surgeon: [name], MD;  Location: [facility];  Service: Vascular;  Laterality: Right;   INSERTION OF DIALYSIS CATHETER N/A 11/26/2015   Procedure: INSERTION OF DIALYSIS CATHETER;  Surgeon: Angelia Mould, MD;  Location: [place];  Service: Vascular;  Laterality: N/A;   INTRAVASCULAR IMAGING/OCT N/A 03/08/2022   Procedure: INTRAVASCULAR IMAGING/OCT;  Surgeon: Sherren Mocha, MD;  Location: [place] [facility];  Service: Cardiovascular;  Laterality: N/A;   LAMINECTOMY  12/29/2011   LEFT HEART CATH AND CORONARY ANGIOGRAPHY N/A 03/06/2022   Procedure: LEFT  HEART CATH AND CORONARY ANGIOGRAPHY;  Surgeon: Nelva Bush, MD;  Location: [place] [facility];  Service: Cardiovascular;  Laterality: N/A;   LUMBAR LAMINECTOMY/DECOMPRESSION MICRODISCECTOMY  12/29/2011   Procedure: LUMBAR LAMINECTOMY/DECOMPRESSION MICRODISCECTOMY;  Surgeon: Floyce Stakes, MD;  Location: [place] [facility];  Service: Neurosurgery;  Laterality: N/A;  Lumbar Two through Lumbar Five Laminectomies/Cellsaver   PARTIAL HYSTERECTOMY     PERIPHERAL VASCULAR BALLOON ANGIOPLASTY  03/03/2020   Procedure: PERIPHERAL VASCULAR BALLOON ANGIOPLASTY;  Surgeon: Marty Heck, MD;  Location: [place] [facility];  Service: Cardiovascular;;  Rt arm fistula   PERIPHERAL VASCULAR BALLOON ANGIOPLASTY Right 04/15/2021   Procedure: PERIPHERAL VASCULAR BALLOON ANGIOPLASTY;  Surgeon: Angelia Mould,  MD;  Location: [place] [facility];  Service: Cardiovascular;  Laterality: Right;  arm fistula   PERIPHERAL VASCULAR BALLOON ANGIOPLASTY  03/14/2022   Procedure: PERIPHERAL VASCULAR BALLOON ANGIOPLASTY;  Surgeon: Broadus John, MD;  Location: [place] [facility];  Service: Cardiovascular;;  right arm AVF   PERIPHERAL VASCULAR CATHETERIZATION N/A 11/22/2015   Procedure: Fistulagram;  Surgeon: Angelia Mould, MD;  Location: [place] [facility];  Service: Cardiovascular;  Laterality: N/A;   PERIPHERAL VASCULAR INTERVENTION  08/10/2017   Procedure: PERIPHERAL VASCULAR INTERVENTION;  Surgeon: Elam Dutch, MD;  Location: [place] [facility];  Service: Cardiovascular;;   SHOULDER SURGERY Right    Rotator cuff     Current Outpatient Medications  Medication Sig Dispense Refill   acetaminophen (TYLENOL) 500 MG tablet Take 1,000 mg by mouth every 6 (six) hours as needed for moderate pain or headache.     albuterol (PROVENTIL) (2.5 MG/3ML) 0.083% nebulizer solution Take 3 mLs (2.5 mg total) by nebulization every 6 (six) hours as needed for wheezing or shortness of breath. 75 mL 12   albuterol  (VENTOLIN HFA) 108 (90 Base) MCG/ACT inhaler Inhale 2 puffs into the lungs every 6 (six) hours as needed for wheezing or shortness of breath. 1 each 2   apixaban (ELIQUIS) 5 MG TABS tablet Take 1 tablet (5 mg total) by mouth 2 (two) times daily. 180 tablet 3   aspirin EC 81 MG tablet Take 1 tablet (81 mg total) by mouth daily. Swallow whole. 90 tablet 3   atenolol (TENORMIN) 100 MG tablet Take 1 tablet (100 mg total) by mouth at bedtime. 90 tablet 3   budesonide-formoterol (SYMBICORT) 160-4.5 MCG/ACT inhaler Inhale 2 puffs into the lungs 2 (two) times daily. 3 each 4   calcium acetate (PHOSLO) 667 MG capsule Take 667-1,334 mg by mouth See admin instructions. Take 1334 mg with meals 3 times daily and 667 mg with snacks     Carboxymethylcellul-Glycerin (LUBRICATING EYE DROPS OP) Place 1 drop into both eyes daily as needed (dry eyes).     cetirizine (ZYRTEC) 10 MG tablet Take 1 tablet (10 mg total) by mouth daily as needed for allergies. (Patient taking differently: Take 10 mg by mouth daily.) 90 tablet 1   Continuous Blood Gluc Receiver (FREESTYLE LIBRE 2 READER) DEVI USE TO TEST BLOOD SUGAR CONTINUOUSLY 1 each 2   Continuous Blood Gluc Sensor (FREESTYLE LIBRE 2 SENSOR) MISC Use multiple times daily to track glucose to prevent highs and lows as a complication of dialysis Dx:Z99.2, E11.59 2 each 12   dextromethorphan-guaiFENesin (MUCINEX DM) 30-600 MG 12hr tablet Take 1 tablet by mouth 2 (two) times daily. 10 tablet 0   gabapentin (NEURONTIN) 400 MG capsule Take 2 capsules (800 mg total) by mouth See admin instructions. Take it after HD (Patient taking differently: Take 800 mg by mouth See admin instructions. Take after dialysis) 30 capsule 1   glucose blood (ONETOUCH ULTRA) test strip Use to test blood sugar 3 times daily as directed. DX: E11.9 300 each 12   hydrOXYzine (ATARAX) 50 MG tablet Take 1 tablet (50 mg total) by mouth 3 (three) times daily as needed for anxiety. 90 tablet 2   insulin aspart  (NOVOLOG) 100 UNIT/ML injection Use as below. Check glucose before each meal. < 150: 0 units. 150-199:1 unit. 200-249: 2 units. 250-299: 3 units. 300-349: 4 units. Over 350: 5 units 10 mL PRN   insulin degludec (TRESIBA FLEXTOUCH) 100 UNIT/ML FlexTouch Pen Inject 20 Units into  the skin at bedtime. Via novo nordisk patient assistance program     lidocaine-prilocaine (EMLA) cream Apply 1 application. topically as needed (dialysis).     multivitamin (RENA-VIT) TABS tablet Take 1 tablet by mouth daily.     nitroGLYCERIN (NITROSTAT) 0.4 MG SL tablet Place 1 tablet (0.4 mg total) under the tongue every 5 (five) minutes as needed for chest pain. 25 tablet 11   omeprazole (PRILOSEC) 20 MG capsule Take 1 capsule (20 mg total) by mouth daily. On an empty stomach 90 capsule 3   ondansetron (ZOFRAN) 4 MG tablet TAKE 1 TABLET BY MOUTH EVERY 8 HOURS AS NEEDED FOR NAUSEA AND VOMITING 20 tablet 0   polyethylene glycol (MIRALAX / GLYCOLAX) 17 g packet Take 17 g by mouth daily as needed for moderate constipation or mild constipation. 14 each 0   predniSONE (DELTASONE) 10 MG tablet Take 5 daily for 3 days followed by 4,3,2 and 1 for 3 days each. 45 tablet 0   QUEtiapine (SEROQUEL) 200 MG tablet Take 1 tablet (200 mg total) by mouth at bedtime. For sleep and for anxiety 90 tablet 1   rOPINIRole (REQUIP) 1 MG tablet Take 1 tablet (1 mg total) by mouth at bedtime. For leg cramps 90 tablet 3   rosuvastatin (CRESTOR) 5 MG tablet Take 1 tablet (5 mg total) by mouth daily. For cholesterol (Patient taking differently: Take 5 mg by mouth at bedtime. For cholesterol) 90 tablet 2   Semaglutide,0.25 or 0.5MG /DOS, (OZEMPIC, 0.25 OR 0.5 MG/DOSE,) 2 MG/1.5ML SOPN Inject 0.5 mg into the skin every Friday.     diltiazem (CARDIZEM CD) 240 MG 24 hr capsule Take 1 capsule (240 mg total) by mouth daily. 90 capsule 3   No current facility-administered medications for this visit.    Allergies:   Azithromycin, Ace inhibitors, Clopidogrel  bisulfate, Codeine, Lisinopril, and Penicillins   ROS:  Please see the history of present illness.   Otherwise, review of systems are positive for none.   All other systems are reviewed and negative.    PHYSICAL EXAM: VS:  BP 132/78   Pulse 100   Ht 5\' 3"  (1.6 m)   Wt 178 lb 9.6 oz (81 kg)   SpO2 99%   BMI 31.64 kg/m  , BMI Body mass index is 31.64 kg/m. GENERAL:  Well appearing NECK:  No jugular venous distention, waveform within normal limits, carotid upstroke brisk and symmetric, no bruits, no thyromegaly LUNGS:  Clear to auscultation bilaterally CHEST:  Unremarkable HEART:  PMI not displaced or sustained,S1 and S2 within normal limits, no S3, no clicks, no rubs, no murmurs, irregular ABD:  Flat, positive bowel sounds normal in frequency in pitch, no bruits, no rebound, no guarding, no midline pulsatile mass, no hepatomegaly, no splenomegaly EXT:  2 plus pulses upper pulses, dorsalis pedis and posterior tibialis decreased, no edema, no cyanosis no clubbing, right upper arm dialysis fistula with thrill and bruit   EKG:  EKG is  ordered today. Atrial fibrillation, rate 100, leftward axis, poor anterior R wave progression, QTc prolonged.  Recent Labs: 03/22/2022: TSH 3.290 03/26/2022: B Natriuretic Peptide 2,276.0 04/26/2022: ALT 17; Magnesium 1.9 04/27/2022: BUN 35; Creatinine, Ser 9.61; Hemoglobin 9.1; Platelets 278; Potassium 3.4; Sodium 129    Lipid Panel    Component Value Date/Time   CHOL 127 03/06/2022 0616   CHOL 203 (H) 01/31/2021 1001   CHOL 241 (H) 03/25/2013 1026   TRIG 95 03/06/2022 0616   TRIG 147 10/09/2013 1527   TRIG  209 (H) 03/25/2013 1026   HDL 44 03/06/2022 0616   HDL 36 (L) 01/31/2021 1001   HDL 65 10/09/2013 1527   HDL 51 03/25/2013 1026   CHOLHDL 2.9 03/06/2022 0616   VLDL 19 03/06/2022 0616   LDLCALC 64 03/06/2022 0616   LDLCALC 124 (H) 01/31/2021 1001   LDLCALC 125 (H) 10/09/2013 1527   LDLCALC 148 (H) 03/25/2013 1026      Wt Readings from  Last 3 Encounters:  05/17/22 178 lb 9.6 oz (81 kg)  05/12/22 176 lb 14.4 oz (80.2 kg)  05/10/22 177 lb 6.4 oz (80.5 kg)    Cardiac cath   Diagnostic Dominance: Right Intervention    Other studies Reviewed: Additional studies/ records that were reviewed today include:   Hospital records Review of the above records demonstrates:  Please see elsewhere in the note.     ASSESSMENT AND PLAN:    NSTEMI:   The patient has no new sypmtoms.  No further cardiovascular testing is indicated.  We will continue with aggressive risk reduction and meds as listed.  Atrial fib: I am going to refer her to EP to consider ablation.  She will continue with anticoagulation.   CAD:   Given the excessive bleeding she is having with dialysis access I am going to stop the Brilinta which was going to stop in a few weeks anyway.  She will continue on Eliquis and I will start ASA 81 mg.  Chronic diastolic HF: Her volume is managed in dialysis.  No change in therapy.  Pericardial effusion: This was small previously.  I will follow this clinically.  Essential hypertension:   Her blood pressure is well controlled.  No change in therapy.    Dyslipidemia: LDL was 64 with an HDL of 44.  No change in therapy.   Current medicines are reviewed at length with the patient today.  The patient does not have concerns regarding medicines.  The following changes have been made: As above  Labs/ tests ordered today include: None  Orders Placed This Encounter  Procedures   Ambulatory referral to Cardiac Electrophysiology   EKG 12-Lead     Disposition:   FU with me 2 months in [place].   Signed, Minus Breeding, MD  05/17/2022 2:29 PM    [place]

## 2022-05-17 ENCOUNTER — Other Ambulatory Visit: Payer: Self-pay | Admitting: *Deleted

## 2022-05-17 ENCOUNTER — Ambulatory Visit (INDEPENDENT_AMBULATORY_CARE_PROVIDER_SITE_OTHER): Payer: Medicare Other | Admitting: Cardiology

## 2022-05-17 ENCOUNTER — Encounter: Payer: Self-pay | Admitting: Cardiology

## 2022-05-17 VITALS — BP 132/78 | HR 100 | Ht 63.0 in | Wt 178.6 lb

## 2022-05-17 DIAGNOSIS — I5033 Acute on chronic diastolic (congestive) heart failure: Secondary | ICD-10-CM | POA: Diagnosis not present

## 2022-05-17 DIAGNOSIS — I10 Essential (primary) hypertension: Secondary | ICD-10-CM

## 2022-05-17 DIAGNOSIS — E785 Hyperlipidemia, unspecified: Secondary | ICD-10-CM | POA: Diagnosis not present

## 2022-05-17 DIAGNOSIS — I70219 Atherosclerosis of native arteries of extremities with intermittent claudication, unspecified extremity: Secondary | ICD-10-CM | POA: Diagnosis not present

## 2022-05-17 DIAGNOSIS — E1159 Type 2 diabetes mellitus with other circulatory complications: Secondary | ICD-10-CM | POA: Diagnosis not present

## 2022-05-17 DIAGNOSIS — I480 Paroxysmal atrial fibrillation: Secondary | ICD-10-CM | POA: Diagnosis not present

## 2022-05-17 DIAGNOSIS — I5032 Chronic diastolic (congestive) heart failure: Secondary | ICD-10-CM

## 2022-05-17 DIAGNOSIS — I2510 Atherosclerotic heart disease of native coronary artery without angina pectoris: Secondary | ICD-10-CM | POA: Diagnosis not present

## 2022-05-17 DIAGNOSIS — I4819 Other persistent atrial fibrillation: Secondary | ICD-10-CM

## 2022-05-17 DIAGNOSIS — Z48812 Encounter for surgical aftercare following surgery on the circulatory system: Secondary | ICD-10-CM | POA: Diagnosis not present

## 2022-05-17 DIAGNOSIS — I152 Hypertension secondary to endocrine disorders: Secondary | ICD-10-CM | POA: Diagnosis not present

## 2022-05-17 DIAGNOSIS — I739 Peripheral vascular disease, unspecified: Secondary | ICD-10-CM

## 2022-05-17 MED ORDER — DILTIAZEM HCL ER COATED BEADS 240 MG PO CP24: 240.0000 mg | ORAL_CAPSULE | Freq: Every day | ORAL | 3 refills | Status: AC

## 2022-05-17 MED ORDER — ASPIRIN 81 MG PO TBEC: 81.0000 mg | DELAYED_RELEASE_TABLET | Freq: Every day | ORAL | 3 refills | Status: DC

## 2022-05-17 NOTE — Patient Instructions (Addendum)
Medication Instructions:   STOP BRILINTA  START ASPIRIN 81 MG ONCE DAILY  *If you need a refill on your cardiac medications before your next appointment, please call your pharmacy*   Follow-Up: At [facility], you and your health needs are our priority.  As part of our continuing mission to provide you with exceptional heart care, we have created designated Provider Care Teams.  These Care Teams include your primary Cardiologist (physician) and Advanced Practice Providers (APPs -  Physician Assistants and Nurse Practitioners) who all work together to provide you with the care you need, when you need it.  We recommend signing up for the patient portal called "MyChart".  Sign up information is provided on this After Visit Summary.  MyChart is used to connect with patients for Virtual Visits (Telemedicine).  Patients are able to view lab/test results, encounter notes, upcoming appointments, etc.  Non-urgent messages can be sent to your provider as well.   To learn more about what you can do with MyChart, go to [email or web address].    Your next appointment:   2 month(s)  The format for your next appointment:   In Person  Provider:   Minus Breeding, MD {  I Important Information About Sugar

## 2022-05-18 ENCOUNTER — Other Ambulatory Visit: Payer: Self-pay | Admitting: Family Medicine

## 2022-05-18 DIAGNOSIS — M19012 Primary osteoarthritis, left shoulder: Secondary | ICD-10-CM | POA: Diagnosis not present

## 2022-05-18 DIAGNOSIS — M542 Cervicalgia: Secondary | ICD-10-CM | POA: Diagnosis not present

## 2022-05-18 DIAGNOSIS — J449 Chronic obstructive pulmonary disease, unspecified: Secondary | ICD-10-CM | POA: Diagnosis not present

## 2022-05-18 DIAGNOSIS — I272 Pulmonary hypertension, unspecified: Secondary | ICD-10-CM | POA: Diagnosis not present

## 2022-05-18 DIAGNOSIS — I152 Hypertension secondary to endocrine disorders: Secondary | ICD-10-CM | POA: Diagnosis not present

## 2022-05-18 DIAGNOSIS — G4733 Obstructive sleep apnea (adult) (pediatric): Secondary | ICD-10-CM | POA: Diagnosis not present

## 2022-05-18 DIAGNOSIS — E782 Mixed hyperlipidemia: Secondary | ICD-10-CM | POA: Diagnosis not present

## 2022-05-18 DIAGNOSIS — D631 Anemia in chronic kidney disease: Secondary | ICD-10-CM | POA: Diagnosis not present

## 2022-05-18 DIAGNOSIS — E1122 Type 2 diabetes mellitus with diabetic chronic kidney disease: Secondary | ICD-10-CM | POA: Diagnosis not present

## 2022-05-18 DIAGNOSIS — Z992 Dependence on renal dialysis: Secondary | ICD-10-CM | POA: Diagnosis not present

## 2022-05-18 DIAGNOSIS — F419 Anxiety disorder, unspecified: Secondary | ICD-10-CM | POA: Diagnosis not present

## 2022-05-18 DIAGNOSIS — N250 Renal osteodystrophy: Secondary | ICD-10-CM | POA: Diagnosis not present

## 2022-05-18 DIAGNOSIS — I5033 Acute on chronic diastolic (congestive) heart failure: Secondary | ICD-10-CM | POA: Diagnosis not present

## 2022-05-18 DIAGNOSIS — E559 Vitamin D deficiency, unspecified: Secondary | ICD-10-CM | POA: Diagnosis not present

## 2022-05-18 DIAGNOSIS — K222 Esophageal obstruction: Secondary | ICD-10-CM | POA: Diagnosis not present

## 2022-05-18 DIAGNOSIS — N186 End stage renal disease: Secondary | ICD-10-CM | POA: Diagnosis not present

## 2022-05-18 DIAGNOSIS — F32A Depression, unspecified: Secondary | ICD-10-CM | POA: Diagnosis not present

## 2022-05-18 DIAGNOSIS — I480 Paroxysmal atrial fibrillation: Secondary | ICD-10-CM | POA: Diagnosis not present

## 2022-05-18 DIAGNOSIS — D509 Iron deficiency anemia, unspecified: Secondary | ICD-10-CM | POA: Diagnosis not present

## 2022-05-18 DIAGNOSIS — K5733 Diverticulitis of large intestine without perforation or abscess with bleeding: Secondary | ICD-10-CM | POA: Diagnosis not present

## 2022-05-18 DIAGNOSIS — M19011 Primary osteoarthritis, right shoulder: Secondary | ICD-10-CM | POA: Diagnosis not present

## 2022-05-18 DIAGNOSIS — E1159 Type 2 diabetes mellitus with other circulatory complications: Secondary | ICD-10-CM | POA: Diagnosis not present

## 2022-05-18 DIAGNOSIS — E1151 Type 2 diabetes mellitus with diabetic peripheral angiopathy without gangrene: Secondary | ICD-10-CM | POA: Diagnosis not present

## 2022-05-18 DIAGNOSIS — I700 Atherosclerosis of aorta: Secondary | ICD-10-CM | POA: Diagnosis not present

## 2022-05-18 DIAGNOSIS — E1140 Type 2 diabetes mellitus with diabetic neuropathy, unspecified: Secondary | ICD-10-CM | POA: Diagnosis not present

## 2022-05-18 DIAGNOSIS — K5790 Diverticulosis of intestine, part unspecified, without perforation or abscess without bleeding: Secondary | ICD-10-CM | POA: Diagnosis not present

## 2022-05-18 DIAGNOSIS — G8929 Other chronic pain: Secondary | ICD-10-CM | POA: Diagnosis not present

## 2022-05-18 DIAGNOSIS — K219 Gastro-esophageal reflux disease without esophagitis: Secondary | ICD-10-CM | POA: Diagnosis not present

## 2022-05-18 DIAGNOSIS — N2581 Secondary hyperparathyroidism of renal origin: Secondary | ICD-10-CM | POA: Diagnosis not present

## 2022-05-18 MED ORDER — NOVOLOG PENFILL 100 UNIT/ML ~~LOC~~ SOCT: SUBCUTANEOUS | 11 refills | Status: DC

## 2022-05-18 NOTE — Telephone Encounter (Signed)
Please let the patient know that I sent their prescription to their pharmacy. Thanks, WS 

## 2022-05-18 NOTE — Telephone Encounter (Signed)
Fax from [facility]: Insulin aspart (Novolog) 100 u/ml Pt would like Flexpen instead of vials

## 2022-05-19 ENCOUNTER — Other Ambulatory Visit: Payer: Self-pay | Admitting: Family Medicine

## 2022-05-19 ENCOUNTER — Telehealth: Payer: Self-pay | Admitting: *Deleted

## 2022-05-19 DIAGNOSIS — Z794 Long term (current) use of insulin: Secondary | ICD-10-CM

## 2022-05-19 MED ORDER — NOVOLOG FLEXPEN 100 UNIT/ML ~~LOC~~ SOPN: 1.0000 [IU] | PEN_INJECTOR | Freq: Three times a day (TID) | SUBCUTANEOUS | 11 refills | Status: AC

## 2022-05-19 NOTE — Telephone Encounter (Signed)
Pharmacy called with 2 issues.  Wrong order was placed for novolog. Should be for novolog flex pen, not 'pen fill cartridge'.  Secondly conflicting sliding scales sent, one from Dr. Livia Snellen, one from Dr. Dennard Schaumann.  Dr. Samella Parr was more aggressive 150-199 2 units vs. Dr. Livia Snellen 150-199 1 unit.  Pharmacist reports that patient thinks she needs the more aggressive approach.   Pharmacy needs clarification

## 2022-05-20 DIAGNOSIS — D509 Iron deficiency anemia, unspecified: Secondary | ICD-10-CM | POA: Diagnosis not present

## 2022-05-20 DIAGNOSIS — N186 End stage renal disease: Secondary | ICD-10-CM | POA: Diagnosis not present

## 2022-05-20 DIAGNOSIS — Z992 Dependence on renal dialysis: Secondary | ICD-10-CM | POA: Diagnosis not present

## 2022-05-20 DIAGNOSIS — N2581 Secondary hyperparathyroidism of renal origin: Secondary | ICD-10-CM | POA: Diagnosis not present

## 2022-05-20 DIAGNOSIS — N250 Renal osteodystrophy: Secondary | ICD-10-CM | POA: Diagnosis not present

## 2022-05-22 DIAGNOSIS — I152 Hypertension secondary to endocrine disorders: Secondary | ICD-10-CM | POA: Diagnosis not present

## 2022-05-22 DIAGNOSIS — E1140 Type 2 diabetes mellitus with diabetic neuropathy, unspecified: Secondary | ICD-10-CM | POA: Diagnosis not present

## 2022-05-22 DIAGNOSIS — E1122 Type 2 diabetes mellitus with diabetic chronic kidney disease: Secondary | ICD-10-CM | POA: Diagnosis not present

## 2022-05-22 DIAGNOSIS — I5033 Acute on chronic diastolic (congestive) heart failure: Secondary | ICD-10-CM | POA: Diagnosis not present

## 2022-05-22 DIAGNOSIS — E1159 Type 2 diabetes mellitus with other circulatory complications: Secondary | ICD-10-CM | POA: Diagnosis not present

## 2022-05-22 DIAGNOSIS — I480 Paroxysmal atrial fibrillation: Secondary | ICD-10-CM | POA: Diagnosis not present

## 2022-05-23 DIAGNOSIS — N2581 Secondary hyperparathyroidism of renal origin: Secondary | ICD-10-CM | POA: Diagnosis not present

## 2022-05-23 DIAGNOSIS — D509 Iron deficiency anemia, unspecified: Secondary | ICD-10-CM | POA: Diagnosis not present

## 2022-05-23 DIAGNOSIS — Z992 Dependence on renal dialysis: Secondary | ICD-10-CM | POA: Diagnosis not present

## 2022-05-23 DIAGNOSIS — N250 Renal osteodystrophy: Secondary | ICD-10-CM | POA: Diagnosis not present

## 2022-05-23 DIAGNOSIS — N186 End stage renal disease: Secondary | ICD-10-CM | POA: Diagnosis not present

## 2022-05-24 DIAGNOSIS — E1140 Type 2 diabetes mellitus with diabetic neuropathy, unspecified: Secondary | ICD-10-CM | POA: Diagnosis not present

## 2022-05-24 DIAGNOSIS — I152 Hypertension secondary to endocrine disorders: Secondary | ICD-10-CM | POA: Diagnosis not present

## 2022-05-24 DIAGNOSIS — I480 Paroxysmal atrial fibrillation: Secondary | ICD-10-CM | POA: Diagnosis not present

## 2022-05-24 DIAGNOSIS — E1122 Type 2 diabetes mellitus with diabetic chronic kidney disease: Secondary | ICD-10-CM | POA: Diagnosis not present

## 2022-05-24 DIAGNOSIS — I5033 Acute on chronic diastolic (congestive) heart failure: Secondary | ICD-10-CM | POA: Diagnosis not present

## 2022-05-24 DIAGNOSIS — E1159 Type 2 diabetes mellitus with other circulatory complications: Secondary | ICD-10-CM | POA: Diagnosis not present

## 2022-05-25 DIAGNOSIS — D509 Iron deficiency anemia, unspecified: Secondary | ICD-10-CM | POA: Diagnosis not present

## 2022-05-25 DIAGNOSIS — Z992 Dependence on renal dialysis: Secondary | ICD-10-CM | POA: Diagnosis not present

## 2022-05-25 DIAGNOSIS — N186 End stage renal disease: Secondary | ICD-10-CM | POA: Diagnosis not present

## 2022-05-25 DIAGNOSIS — N250 Renal osteodystrophy: Secondary | ICD-10-CM | POA: Diagnosis not present

## 2022-05-25 DIAGNOSIS — N2581 Secondary hyperparathyroidism of renal origin: Secondary | ICD-10-CM | POA: Diagnosis not present

## 2022-05-27 DIAGNOSIS — N186 End stage renal disease: Secondary | ICD-10-CM | POA: Diagnosis not present

## 2022-05-27 DIAGNOSIS — Z992 Dependence on renal dialysis: Secondary | ICD-10-CM | POA: Diagnosis not present

## 2022-05-27 DIAGNOSIS — N250 Renal osteodystrophy: Secondary | ICD-10-CM | POA: Diagnosis not present

## 2022-05-27 DIAGNOSIS — N2581 Secondary hyperparathyroidism of renal origin: Secondary | ICD-10-CM | POA: Diagnosis not present

## 2022-05-27 DIAGNOSIS — D509 Iron deficiency anemia, unspecified: Secondary | ICD-10-CM | POA: Diagnosis not present

## 2022-05-30 ENCOUNTER — Inpatient Hospital Stay (HOSPITAL_COMMUNITY)
Admission: EM | Admit: 2022-05-30 | Discharge: 2022-06-04 | DRG: 377 | Disposition: A | Discharge status: Home / Self Care | Payer: Medicare Other | Attending: Internal Medicine | Admitting: Internal Medicine

## 2022-05-30 ENCOUNTER — Ambulatory Visit (INDEPENDENT_AMBULATORY_CARE_PROVIDER_SITE_OTHER): Payer: Medicare Other

## 2022-05-30 ENCOUNTER — Emergency Department (HOSPITAL_COMMUNITY): Payer: Medicare Other

## 2022-05-30 ENCOUNTER — Other Ambulatory Visit: Payer: Self-pay

## 2022-05-30 ENCOUNTER — Encounter (HOSPITAL_COMMUNITY): Payer: Self-pay | Admitting: Emergency Medicine

## 2022-05-30 DIAGNOSIS — E1169 Type 2 diabetes mellitus with other specified complication: Secondary | ICD-10-CM | POA: Diagnosis present

## 2022-05-30 DIAGNOSIS — E1151 Type 2 diabetes mellitus with diabetic peripheral angiopathy without gangrene: Secondary | ICD-10-CM | POA: Diagnosis not present

## 2022-05-30 DIAGNOSIS — I272 Pulmonary hypertension, unspecified: Secondary | ICD-10-CM | POA: Diagnosis not present

## 2022-05-30 DIAGNOSIS — R112 Nausea with vomiting, unspecified: Secondary | ICD-10-CM | POA: Diagnosis not present

## 2022-05-30 DIAGNOSIS — K5900 Constipation, unspecified: Secondary | ICD-10-CM | POA: Diagnosis present

## 2022-05-30 DIAGNOSIS — I252 Old myocardial infarction: Secondary | ICD-10-CM

## 2022-05-30 DIAGNOSIS — Z955 Presence of coronary angioplasty implant and graft: Secondary | ICD-10-CM

## 2022-05-30 DIAGNOSIS — E782 Mixed hyperlipidemia: Secondary | ICD-10-CM | POA: Diagnosis not present

## 2022-05-30 DIAGNOSIS — Z7985 Long-term (current) use of injectable non-insulin antidiabetic drugs: Secondary | ICD-10-CM | POA: Diagnosis not present

## 2022-05-30 DIAGNOSIS — E1140 Type 2 diabetes mellitus with diabetic neuropathy, unspecified: Secondary | ICD-10-CM | POA: Diagnosis present

## 2022-05-30 DIAGNOSIS — Z8249 Family history of ischemic heart disease and other diseases of the circulatory system: Secondary | ICD-10-CM

## 2022-05-30 DIAGNOSIS — M542 Cervicalgia: Secondary | ICD-10-CM

## 2022-05-30 DIAGNOSIS — Z7982 Long term (current) use of aspirin: Secondary | ICD-10-CM

## 2022-05-30 DIAGNOSIS — I2510 Atherosclerotic heart disease of native coronary artery without angina pectoris: Secondary | ICD-10-CM | POA: Diagnosis not present

## 2022-05-30 DIAGNOSIS — I3139 Other pericardial effusion (noninflammatory): Secondary | ICD-10-CM | POA: Diagnosis not present

## 2022-05-30 DIAGNOSIS — R079 Chest pain, unspecified: Secondary | ICD-10-CM | POA: Diagnosis not present

## 2022-05-30 DIAGNOSIS — I4819 Other persistent atrial fibrillation: Secondary | ICD-10-CM | POA: Diagnosis not present

## 2022-05-30 DIAGNOSIS — I5033 Acute on chronic diastolic (congestive) heart failure: Secondary | ICD-10-CM

## 2022-05-30 DIAGNOSIS — E1122 Type 2 diabetes mellitus with diabetic chronic kidney disease: Secondary | ICD-10-CM

## 2022-05-30 DIAGNOSIS — Z90711 Acquired absence of uterus with remaining cervical stump: Secondary | ICD-10-CM

## 2022-05-30 DIAGNOSIS — E1121 Type 2 diabetes mellitus with diabetic nephropathy: Secondary | ICD-10-CM | POA: Diagnosis not present

## 2022-05-30 DIAGNOSIS — I739 Peripheral vascular disease, unspecified: Secondary | ICD-10-CM | POA: Diagnosis not present

## 2022-05-30 DIAGNOSIS — Z7951 Long term (current) use of inhaled steroids: Secondary | ICD-10-CM

## 2022-05-30 DIAGNOSIS — I700 Atherosclerosis of aorta: Secondary | ICD-10-CM

## 2022-05-30 DIAGNOSIS — Z888 Allergy status to other drugs, medicaments and biological substances status: Secondary | ICD-10-CM

## 2022-05-30 DIAGNOSIS — K922 Gastrointestinal hemorrhage, unspecified: Secondary | ICD-10-CM

## 2022-05-30 DIAGNOSIS — D631 Anemia in chronic kidney disease: Secondary | ICD-10-CM

## 2022-05-30 DIAGNOSIS — Z7901 Long term (current) use of anticoagulants: Secondary | ICD-10-CM

## 2022-05-30 DIAGNOSIS — K222 Esophageal obstruction: Secondary | ICD-10-CM

## 2022-05-30 DIAGNOSIS — J449 Chronic obstructive pulmonary disease, unspecified: Secondary | ICD-10-CM | POA: Diagnosis not present

## 2022-05-30 DIAGNOSIS — K31811 Angiodysplasia of stomach and duodenum with bleeding: Principal | ICD-10-CM | POA: Diagnosis present

## 2022-05-30 DIAGNOSIS — M160 Bilateral primary osteoarthritis of hip: Secondary | ICD-10-CM | POA: Diagnosis not present

## 2022-05-30 DIAGNOSIS — Z880 Allergy status to penicillin: Secondary | ICD-10-CM

## 2022-05-30 DIAGNOSIS — M1990 Unspecified osteoarthritis, unspecified site: Secondary | ICD-10-CM | POA: Diagnosis present

## 2022-05-30 DIAGNOSIS — I480 Paroxysmal atrial fibrillation: Secondary | ICD-10-CM | POA: Diagnosis present

## 2022-05-30 DIAGNOSIS — E669 Obesity, unspecified: Secondary | ICD-10-CM | POA: Diagnosis not present

## 2022-05-30 DIAGNOSIS — Z87891 Personal history of nicotine dependence: Secondary | ICD-10-CM

## 2022-05-30 DIAGNOSIS — E1159 Type 2 diabetes mellitus with other circulatory complications: Secondary | ICD-10-CM | POA: Diagnosis not present

## 2022-05-30 DIAGNOSIS — E1165 Type 2 diabetes mellitus with hyperglycemia: Secondary | ICD-10-CM | POA: Diagnosis present

## 2022-05-30 DIAGNOSIS — Z794 Long term (current) use of insulin: Secondary | ICD-10-CM | POA: Diagnosis not present

## 2022-05-30 DIAGNOSIS — Z833 Family history of diabetes mellitus: Secondary | ICD-10-CM

## 2022-05-30 DIAGNOSIS — G8929 Other chronic pain: Secondary | ICD-10-CM

## 2022-05-30 DIAGNOSIS — D509 Iron deficiency anemia, unspecified: Secondary | ICD-10-CM | POA: Diagnosis not present

## 2022-05-30 DIAGNOSIS — I152 Hypertension secondary to endocrine disorders: Secondary | ICD-10-CM | POA: Diagnosis present

## 2022-05-30 DIAGNOSIS — F419 Anxiety disorder, unspecified: Secondary | ICD-10-CM

## 2022-05-30 DIAGNOSIS — Z961 Presence of intraocular lens: Secondary | ICD-10-CM | POA: Diagnosis present

## 2022-05-30 DIAGNOSIS — Z79899 Other long term (current) drug therapy: Secondary | ICD-10-CM

## 2022-05-30 DIAGNOSIS — I5030 Unspecified diastolic (congestive) heart failure: Secondary | ICD-10-CM | POA: Diagnosis not present

## 2022-05-30 DIAGNOSIS — K219 Gastro-esophageal reflux disease without esophagitis: Secondary | ICD-10-CM

## 2022-05-30 DIAGNOSIS — G4700 Insomnia, unspecified: Secondary | ICD-10-CM | POA: Diagnosis present

## 2022-05-30 DIAGNOSIS — E8779 Other fluid overload: Secondary | ICD-10-CM | POA: Diagnosis not present

## 2022-05-30 DIAGNOSIS — Z6831 Body mass index (BMI) 31.0-31.9, adult: Secondary | ICD-10-CM

## 2022-05-30 DIAGNOSIS — Z992 Dependence on renal dialysis: Secondary | ICD-10-CM

## 2022-05-30 DIAGNOSIS — I132 Hypertensive heart and chronic kidney disease with heart failure and with stage 5 chronic kidney disease, or end stage renal disease: Secondary | ICD-10-CM | POA: Diagnosis not present

## 2022-05-30 DIAGNOSIS — N186 End stage renal disease: Secondary | ICD-10-CM | POA: Diagnosis not present

## 2022-05-30 DIAGNOSIS — K5790 Diverticulosis of intestine, part unspecified, without perforation or abscess without bleeding: Secondary | ICD-10-CM

## 2022-05-30 DIAGNOSIS — Z8673 Personal history of transient ischemic attack (TIA), and cerebral infarction without residual deficits: Secondary | ICD-10-CM

## 2022-05-30 DIAGNOSIS — Z881 Allergy status to other antibiotic agents status: Secondary | ICD-10-CM

## 2022-05-30 DIAGNOSIS — Z885 Allergy status to narcotic agent status: Secondary | ICD-10-CM

## 2022-05-30 DIAGNOSIS — I5032 Chronic diastolic (congestive) heart failure: Secondary | ICD-10-CM | POA: Diagnosis present

## 2022-05-30 DIAGNOSIS — I878 Other specified disorders of veins: Secondary | ICD-10-CM | POA: Diagnosis not present

## 2022-05-30 DIAGNOSIS — I358 Other nonrheumatic aortic valve disorders: Secondary | ICD-10-CM | POA: Diagnosis present

## 2022-05-30 DIAGNOSIS — R778 Other specified abnormalities of plasma proteins: Secondary | ICD-10-CM | POA: Diagnosis not present

## 2022-05-30 DIAGNOSIS — R0602 Shortness of breath: Secondary | ICD-10-CM | POA: Diagnosis not present

## 2022-05-30 DIAGNOSIS — F32A Depression, unspecified: Secondary | ICD-10-CM

## 2022-05-30 DIAGNOSIS — M898X9 Other specified disorders of bone, unspecified site: Secondary | ICD-10-CM | POA: Diagnosis present

## 2022-05-30 DIAGNOSIS — I4891 Unspecified atrial fibrillation: Secondary | ICD-10-CM | POA: Diagnosis not present

## 2022-05-30 DIAGNOSIS — J441 Chronic obstructive pulmonary disease with (acute) exacerbation: Secondary | ICD-10-CM | POA: Diagnosis not present

## 2022-05-30 DIAGNOSIS — E876 Hypokalemia: Secondary | ICD-10-CM | POA: Diagnosis not present

## 2022-05-30 DIAGNOSIS — F411 Generalized anxiety disorder: Secondary | ICD-10-CM | POA: Diagnosis present

## 2022-05-30 DIAGNOSIS — D62 Acute posthemorrhagic anemia: Secondary | ICD-10-CM | POA: Diagnosis not present

## 2022-05-30 DIAGNOSIS — M19011 Primary osteoarthritis, right shoulder: Secondary | ICD-10-CM

## 2022-05-30 DIAGNOSIS — D649 Anemia, unspecified: Principal | ICD-10-CM

## 2022-05-30 DIAGNOSIS — N250 Renal osteodystrophy: Secondary | ICD-10-CM | POA: Diagnosis not present

## 2022-05-30 DIAGNOSIS — G4733 Obstructive sleep apnea (adult) (pediatric): Secondary | ICD-10-CM

## 2022-05-30 DIAGNOSIS — E559 Vitamin D deficiency, unspecified: Secondary | ICD-10-CM | POA: Diagnosis present

## 2022-05-30 DIAGNOSIS — K5733 Diverticulitis of large intestine without perforation or abscess with bleeding: Secondary | ICD-10-CM

## 2022-05-30 DIAGNOSIS — M19012 Primary osteoarthritis, left shoulder: Secondary | ICD-10-CM

## 2022-05-30 DIAGNOSIS — N2581 Secondary hyperparathyroidism of renal origin: Secondary | ICD-10-CM | POA: Diagnosis not present

## 2022-05-30 LAB — BASIC METABOLIC PANEL
Anion gap: 13 (ref 5–15)
BUN: 22 mg/dL (ref 8–23)
CO2: 30 mmol/L (ref 22–32)
Calcium: 8.3 mg/dL — ABNORMAL LOW (ref 8.9–10.3)
Chloride: 91 mmol/L — ABNORMAL LOW (ref 98–111)
Creatinine, Ser: 5.01 mg/dL — ABNORMAL HIGH (ref 0.44–1.00)
GFR, Estimated: 9 mL/min — ABNORMAL LOW (ref >60)
Glucose, Bld: 233 mg/dL — ABNORMAL HIGH (ref 70–99)
Potassium: 3.4 mmol/L — ABNORMAL LOW (ref 3.5–5.1)
Sodium: 134 mmol/L — ABNORMAL LOW (ref 135–145)

## 2022-05-30 LAB — TROPONIN I (HIGH SENSITIVITY)
Troponin I (High Sensitivity): 89 ng/L — ABNORMAL HIGH (ref <18)
Troponin I (High Sensitivity): 92 ng/L — ABNORMAL HIGH (ref <18)

## 2022-05-30 LAB — CBC
HCT: 23.8 % — ABNORMAL LOW (ref 36.0–46.0)
Hemoglobin: 7.6 g/dL — ABNORMAL LOW (ref 12.0–15.0)
MCH: 27.9 pg (ref 26.0–34.0)
MCHC: 31.9 g/dL (ref 30.0–36.0)
MCV: 87.5 fL (ref 80.0–100.0)
Platelets: 178 10*3/uL (ref 150–400)
RBC: 2.72 MIL/uL — ABNORMAL LOW (ref 3.87–5.11)
RDW: 19.2 % — ABNORMAL HIGH (ref 11.5–15.5)
WBC: 11.7 10*3/uL — ABNORMAL HIGH (ref 4.0–10.5)
nRBC: 0 % (ref 0.0–0.2)

## 2022-05-30 LAB — PREPARE RBC (CROSSMATCH)

## 2022-05-30 LAB — POC OCCULT BLOOD, ED: Fecal Occult Bld: POSITIVE — AB

## 2022-05-30 MED ORDER — INSULIN DETEMIR 100 UNIT/ML ~~LOC~~ SOLN
15.0000 [IU] | Freq: Every day | SUBCUTANEOUS | Status: DC
  Administered 2022-05-31 – 2022-06-03 (×5): 15 [IU] via SUBCUTANEOUS
  Filled 2022-05-30 (×6): qty 0.15

## 2022-05-30 MED ORDER — ONDANSETRON HCL 4 MG PO TABS
4.0000 mg | ORAL_TABLET | Freq: Four times a day (QID) | ORAL | Status: DC | PRN
  Administered 2022-06-03: 4 mg via ORAL
  Filled 2022-05-30: qty 1

## 2022-05-30 MED ORDER — GABAPENTIN 400 MG PO CAPS
800.0000 mg | ORAL_CAPSULE | ORAL | Status: DC
  Filled 2022-05-30 (×2): qty 2

## 2022-05-30 MED ORDER — INSULIN ASPART 100 UNIT/ML IJ SOLN
0.0000 [IU] | Freq: Every day | INTRAMUSCULAR | Status: DC
  Administered 2022-05-31: 2 [IU] via SUBCUTANEOUS
  Administered 2022-06-01: 3 [IU] via SUBCUTANEOUS

## 2022-05-30 MED ORDER — DILTIAZEM HCL ER COATED BEADS 240 MG PO CP24
240.0000 mg | ORAL_CAPSULE | Freq: Every day | ORAL | Status: DC
  Administered 2022-05-31 – 2022-06-04 (×4): 240 mg via ORAL
  Filled 2022-05-30 (×5): qty 1

## 2022-05-30 MED ORDER — SODIUM CHLORIDE 0.9 % IV SOLN: 10.0000 mL/h | Freq: Once | INTRAVENOUS | Status: DC

## 2022-05-30 MED ORDER — QUETIAPINE FUMARATE 100 MG PO TABS
200.0000 mg | ORAL_TABLET | Freq: Every day | ORAL | Status: DC
  Administered 2022-05-30 – 2022-06-03 (×5): 200 mg via ORAL
  Filled 2022-05-30 (×5): qty 2

## 2022-05-30 MED ORDER — ONDANSETRON HCL 4 MG/2ML IJ SOLN
4.0000 mg | Freq: Four times a day (QID) | INTRAMUSCULAR | Status: DC | PRN
  Administered 2022-06-01 – 2022-06-02 (×2): 4 mg via INTRAVENOUS
  Filled 2022-05-30 (×2): qty 2

## 2022-05-30 MED ORDER — MORPHINE SULFATE (PF) 2 MG/ML IV SOLN: 2.0000 mg | INTRAVENOUS | Status: DC | PRN

## 2022-05-30 MED ORDER — OXYCODONE HCL 5 MG PO TABS: 5.0000 mg | ORAL_TABLET | ORAL | Status: DC | PRN

## 2022-05-30 MED ORDER — HYDROXYZINE HCL 25 MG PO TABS
50.0000 mg | ORAL_TABLET | Freq: Three times a day (TID) | ORAL | Status: DC | PRN
  Administered 2022-06-01 – 2022-06-02 (×2): 50 mg via ORAL
  Filled 2022-05-30 (×2): qty 2

## 2022-05-30 MED ORDER — ALBUTEROL SULFATE (2.5 MG/3ML) 0.083% IN NEBU: 2.5000 mg | INHALATION_SOLUTION | Freq: Four times a day (QID) | RESPIRATORY_TRACT | Status: DC | PRN

## 2022-05-30 MED ORDER — INSULIN ASPART 100 UNIT/ML IJ SOLN
0.0000 [IU] | Freq: Three times a day (TID) | INTRAMUSCULAR | Status: DC
  Administered 2022-05-31: 8 [IU] via SUBCUTANEOUS
  Administered 2022-05-31 (×2): 5 [IU] via SUBCUTANEOUS
  Administered 2022-06-01: 3 [IU] via SUBCUTANEOUS
  Administered 2022-06-02 – 2022-06-03 (×2): 8 [IU] via SUBCUTANEOUS
  Administered 2022-06-04: 5 [IU] via SUBCUTANEOUS

## 2022-05-30 MED ORDER — MOMETASONE FURO-FORMOTEROL FUM 200-5 MCG/ACT IN AERO
2.0000 | INHALATION_SPRAY | Freq: Two times a day (BID) | RESPIRATORY_TRACT | Status: DC
  Administered 2022-05-31 – 2022-06-04 (×9): 2 via RESPIRATORY_TRACT
  Filled 2022-05-30 (×2): qty 8.8

## 2022-05-30 MED ORDER — ACETAMINOPHEN 650 MG RE SUPP: 650.0000 mg | Freq: Four times a day (QID) | RECTAL | Status: DC | PRN

## 2022-05-30 MED ORDER — ROPINIROLE HCL 1 MG PO TABS
1.0000 mg | ORAL_TABLET | Freq: Every day | ORAL | Status: DC
  Administered 2022-05-30 – 2022-06-01 (×3): 1 mg via ORAL
  Filled 2022-05-30 (×5): qty 1

## 2022-05-30 MED ORDER — ATENOLOL 25 MG PO TABS
100.0000 mg | ORAL_TABLET | Freq: Every day | ORAL | Status: DC
  Administered 2022-05-30 – 2022-06-03 (×5): 100 mg via ORAL
  Filled 2022-05-30 (×5): qty 4

## 2022-05-30 MED ORDER — CALCIUM ACETATE 667 MG PO CAPS
1334.0000 mg | ORAL_CAPSULE | Freq: Three times a day (TID) | ORAL | Status: DC
  Administered 2022-06-02 – 2022-06-03 (×2): 1334 mg via ORAL
  Filled 2022-05-30 (×22): qty 2

## 2022-05-30 MED ORDER — PANTOPRAZOLE SODIUM 40 MG IV SOLR
40.0000 mg | Freq: Two times a day (BID) | INTRAVENOUS | Status: DC
  Administered 2022-05-30 – 2022-06-03 (×7): 40 mg via INTRAVENOUS
  Filled 2022-05-30 (×8): qty 10

## 2022-05-30 MED ORDER — ROSUVASTATIN CALCIUM 10 MG PO TABS
5.0000 mg | ORAL_TABLET | Freq: Every day | ORAL | Status: DC
  Administered 2022-05-30 – 2022-06-03 (×5): 5 mg via ORAL
  Filled 2022-05-30 (×5): qty 1

## 2022-05-30 MED ORDER — ACETAMINOPHEN 325 MG PO TABS
650.0000 mg | ORAL_TABLET | Freq: Four times a day (QID) | ORAL | Status: DC | PRN
  Administered 2022-05-31 – 2022-06-01 (×3): 650 mg via ORAL
  Filled 2022-05-30 (×3): qty 2

## 2022-05-30 NOTE — Assessment & Plan Note (Signed)
Continue Atarax ?

## 2022-05-30 NOTE — Assessment & Plan Note (Signed)
-   Troponin flat 89, 92-in the setting of ESRD -EKG is without acute ischemic changes but does show old septal infarct -We will recheck EKG/troponin if patient has chest pain -Currently chest pain-free -Continue to monitor on telemetry

## 2022-05-30 NOTE — Assessment & Plan Note (Signed)
-   30 units basal insulin at baseline -Continue with 15 units basal insulin and sliding scale coverage -Renal diet -Last hemoglobin A1c 8.0 -Monitor CBGs

## 2022-05-30 NOTE — Assessment & Plan Note (Signed)
-   Continue Symbicort and as needed albuterol

## 2022-05-30 NOTE — Assessment & Plan Note (Addendum)
-   Consult nephrology for routine dialysis -Potassium 3.4, bicarb 30, BUN 22, creatinine 5.01 -labs are at baseline -Continue PhosLo -Renal diet with fluid restrictions especially in the setting of blood transfusion -Continue to monitor

## 2022-05-31 ENCOUNTER — Ambulatory Visit: Payer: Medicare Other | Admitting: Family Medicine

## 2022-05-31 DIAGNOSIS — I2510 Atherosclerotic heart disease of native coronary artery without angina pectoris: Secondary | ICD-10-CM

## 2022-05-31 DIAGNOSIS — E1121 Type 2 diabetes mellitus with diabetic nephropathy: Secondary | ICD-10-CM

## 2022-05-31 DIAGNOSIS — K5900 Constipation, unspecified: Secondary | ICD-10-CM | POA: Diagnosis not present

## 2022-05-31 DIAGNOSIS — I152 Hypertension secondary to endocrine disorders: Secondary | ICD-10-CM

## 2022-05-31 DIAGNOSIS — E1122 Type 2 diabetes mellitus with diabetic chronic kidney disease: Secondary | ICD-10-CM | POA: Diagnosis not present

## 2022-05-31 DIAGNOSIS — E782 Mixed hyperlipidemia: Secondary | ICD-10-CM

## 2022-05-31 DIAGNOSIS — I4891 Unspecified atrial fibrillation: Secondary | ICD-10-CM

## 2022-05-31 DIAGNOSIS — D649 Anemia, unspecified: Secondary | ICD-10-CM

## 2022-05-31 DIAGNOSIS — I739 Peripheral vascular disease, unspecified: Secondary | ICD-10-CM

## 2022-05-31 DIAGNOSIS — J449 Chronic obstructive pulmonary disease, unspecified: Secondary | ICD-10-CM

## 2022-05-31 DIAGNOSIS — J441 Chronic obstructive pulmonary disease with (acute) exacerbation: Secondary | ICD-10-CM

## 2022-05-31 DIAGNOSIS — R778 Other specified abnormalities of plasma proteins: Secondary | ICD-10-CM

## 2022-05-31 DIAGNOSIS — I480 Paroxysmal atrial fibrillation: Secondary | ICD-10-CM

## 2022-05-31 DIAGNOSIS — K922 Gastrointestinal hemorrhage, unspecified: Secondary | ICD-10-CM

## 2022-05-31 DIAGNOSIS — Z992 Dependence on renal dialysis: Secondary | ICD-10-CM

## 2022-05-31 DIAGNOSIS — N186 End stage renal disease: Secondary | ICD-10-CM

## 2022-05-31 DIAGNOSIS — E876 Hypokalemia: Secondary | ICD-10-CM

## 2022-05-31 DIAGNOSIS — E1159 Type 2 diabetes mellitus with other circulatory complications: Secondary | ICD-10-CM

## 2022-05-31 LAB — CBC WITH DIFFERENTIAL/PLATELET
Abs Immature Granulocytes: 0.02 10*3/uL (ref 0.00–0.07)
Basophils Absolute: 0 10*3/uL (ref 0.0–0.1)
Basophils Relative: 0 %
Eosinophils Absolute: 0.4 10*3/uL (ref 0.0–0.5)
Eosinophils Relative: 4 %
HCT: 23.8 % — ABNORMAL LOW (ref 36.0–46.0)
Hemoglobin: 7.7 g/dL — ABNORMAL LOW (ref 12.0–15.0)
Immature Granulocytes: 0 %
Lymphocytes Relative: 10 %
Lymphs Abs: 0.9 10*3/uL (ref 0.7–4.0)
MCH: 27.8 pg (ref 26.0–34.0)
MCHC: 32.4 g/dL (ref 30.0–36.0)
MCV: 85.9 fL (ref 80.0–100.0)
Monocytes Absolute: 0.7 10*3/uL (ref 0.1–1.0)
Monocytes Relative: 7 %
Neutro Abs: 6.9 10*3/uL (ref 1.7–7.7)
Neutrophils Relative %: 79 %
Platelets: 149 10*3/uL — ABNORMAL LOW (ref 150–400)
RBC: 2.77 MIL/uL — ABNORMAL LOW (ref 3.87–5.11)
RDW: 17.7 % — ABNORMAL HIGH (ref 11.5–15.5)
WBC: 8.9 10*3/uL (ref 4.0–10.5)
nRBC: 0 % (ref 0.0–0.2)

## 2022-05-31 LAB — GLUCOSE, CAPILLARY
Glucose-Capillary: 194 mg/dL — ABNORMAL HIGH (ref 70–99)
Glucose-Capillary: 207 mg/dL — ABNORMAL HIGH (ref 70–99)
Glucose-Capillary: 235 mg/dL — ABNORMAL HIGH (ref 70–99)
Glucose-Capillary: 276 mg/dL — ABNORMAL HIGH (ref 70–99)
Glucose-Capillary: 250 mg/dL — ABNORMAL HIGH (ref 70–99)

## 2022-05-31 LAB — COMPREHENSIVE METABOLIC PANEL
ALT: 30 U/L (ref 0–44)
AST: 23 U/L (ref 15–41)
Albumin: 2.4 g/dL — ABNORMAL LOW (ref 3.5–5.0)
Alkaline Phosphatase: 63 U/L (ref 38–126)
Anion gap: 12 (ref 5–15)
BUN: 28 mg/dL — ABNORMAL HIGH (ref 8–23)
CO2: 27 mmol/L (ref 22–32)
Calcium: 7.9 mg/dL — ABNORMAL LOW (ref 8.9–10.3)
Chloride: 92 mmol/L — ABNORMAL LOW (ref 98–111)
Creatinine, Ser: 5.89 mg/dL — ABNORMAL HIGH (ref 0.44–1.00)
GFR, Estimated: 7 mL/min — ABNORMAL LOW (ref >60)
Glucose, Bld: 278 mg/dL — ABNORMAL HIGH (ref 70–99)
Potassium: 2.8 mmol/L — ABNORMAL LOW (ref 3.5–5.1)
Sodium: 131 mmol/L — ABNORMAL LOW (ref 135–145)
Total Bilirubin: 0.9 mg/dL (ref 0.3–1.2)
Total Protein: 5.3 g/dL — ABNORMAL LOW (ref 6.5–8.1)

## 2022-05-31 LAB — PREPARE RBC (CROSSMATCH)

## 2022-05-31 LAB — POTASSIUM: Potassium: 3.9 mmol/L (ref 3.5–5.1)

## 2022-05-31 LAB — MAGNESIUM: Magnesium: 2 mg/dL (ref 1.7–2.4)

## 2022-05-31 MED ORDER — CALCIUM ACETATE (PHOS BINDER) 667 MG PO CAPS: 667.0000 mg | ORAL_CAPSULE | ORAL | Status: DC | PRN

## 2022-05-31 MED ORDER — SODIUM CHLORIDE 0.9% IV SOLUTION: Freq: Once | INTRAVENOUS | Status: AC

## 2022-05-31 MED ORDER — POTASSIUM CHLORIDE 20 MEQ PO PACK
40.0000 meq | PACK | Freq: Once | ORAL | Status: DC
  Filled 2022-05-31: qty 2

## 2022-05-31 MED ORDER — TICAGRELOR 90 MG PO TABS
90.0000 mg | ORAL_TABLET | Freq: Two times a day (BID) | ORAL | Status: DC
  Administered 2022-05-31 – 2022-06-04 (×8): 90 mg via ORAL
  Filled 2022-05-31 (×9): qty 1

## 2022-05-31 MED ORDER — POTASSIUM CHLORIDE 10 MEQ/100ML IV SOLN
10.0000 meq | INTRAVENOUS | Status: AC
  Administered 2022-05-31 (×2): 10 meq via INTRAVENOUS
  Filled 2022-05-31 (×2): qty 100

## 2022-05-31 NOTE — Consult Note (Signed)
Referring Provider: No ref. provider found Primary Care Physician:  [name], MD Primary Gastroenterologist:  Dr.Schooler, [facility]  Date of Admission: 05/30/22 Date of Consultation: 05/31/22  Reason for Consultation:  symptomatic anemia, GI bleed  HPI:  [name] is a 71 y.o. 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 Reportedly with pressure in the center of her chest and associated palpitations x4 days. Reported some peripheral edema, as lasix was d/ced during last admission. She endorsed chronic nausea. Also with dyspnea. She noted more bleeding during dialysis but no rectal bleeding reported.   ED Course: BP slightly soft at 98/65-128/69, other VSS Patient on 2L O2 in ED r/t dyspnea, though no supplemental O2 at home Hemoglobin found to be 7.6 and FOBT positive, transfused 1 unit PRBCs WBC 11.7 EKG with A fib, HR 99 Started on PPI BID GI consulted for anemia secondary to GI bleed  Consult: Appears hgb baseline is around 10-11, appears to have been trending down from 11 since late May.  Patient states that she presented to ED as she had chest pain, SOB and felt she had no energy, symptoms really began in [place] but have worsened recently. She reports that she was unable to eat much at home as she did not feel hungry and most foods did not taste good. She was also craving more ice at home. She denies abdominal pain but endorses nausea that has been off and on for the past few months. She has been receiving an injection for her DM and endorses nausea started when she began this medication. She denies vomiting. Has some occasion post prandial abdomina pain just above umbilical region. Pain is improved with nausea medicine. She endorses history of acid reflux/heartburn, takes  omeprazole 20mg  once daily which seems to keep symptoms at [place], though she does endorse taking alka seltzer maybe twice per week. She endorses some early satiety for months as well. No blood in stools or black stools. Has a BM every 2-3 days, sometimes has to strain to defecate, she take Miralax PRN, maybe twice per week. She has good results from this. Suspect about 10 lbs of weight loss in the past month, notably per chart review, she was 82.5 kg 04/27/22, is 80kg on admission. She takes tylenol as needed but no NSAIDs. Does not drink or smoke. Denies any pertinent GI history. Endorses occasional dysphagia maybe twice per week, usually with pills, can usually get them to pass with drinking liquids. Does report she was previously on Brillinta, last dose approx 1 month ago. Last BM was yesterday, normal with no presence of blood or melena.   Per chart review, patient has hx of mid esophageal stricture, seen on barium swallow in 1999, EGD was attempted and there was question of candidiasis but patient apparently had severe wretching at the time and suffered an esophageal mucosa tear  This was superficial and Gastrografin swallow at that time did not demonstrate any extravasation.    Last Eliquis dose: 05/30/22 around 1300  Last Colonoscopy:[facility] 09/19/21 prep fair, diverticulosis in sigmoid, internal hemorrhoids, repeat in 10 years Last EGD:1999? due to dysphagia, exam not completed due to wretching and secondary esophageal mucosal tear  Past Medical History:  Diagnosis Date   Acute on chronic diastolic CHF (congestive heart failure) ([place])  03/05/2015   Grade 2. EF 60-65%   Anemia    hx of   Anxiety    Arthritis    CAD (coronary artery disease)    s/p Stenting in 2005;  [place] 7/09: Vigorous LV function, 2-3+ MR, RI 40%, RI stent patent, proximal-mid RCA 40-50%;  Echo 7/09:  EF 55-65%, trivial MR;  [place] 11/18/12: EF 66%, normal LV wall motion, mild to moderate anterior ischemia - reviewed by [facility] and  felt to rep breast atten (low risk);  Echo 6/14: mild LVH, EF 55-65%, Gr 2 DD, PASP 44, trivial eff    Chronic kidney disease    CREATININE IS UP--GOING TO KIDNEY MD    Chronic neck pain    COPD (chronic obstructive pulmonary disease) (HCC)    Degenerative joint disease    left shoulder   Diabetic neuropathy (HCC)    Diverticulosis    Esophageal stricture    GERD (gastroesophageal reflux disease)    Hyperlipidemia    Hypertension    IDDM (insulin dependent diabetes mellitus)    Pericardial effusion 03/06/2015   Very small per echo ; pleural nodules seen on CT chest.   Peripheral vascular disease ([place])    Stroke ([place])    "mini- years ago"   Vitamin D deficiency     Past Surgical History:  Procedure Laterality Date   A/V FISTULAGRAM N/A 08/10/2017   Procedure: A/V Fistulagram - Right Arm;  Surgeon: Elam Dutch, MD;  Location: [place] [facility];  Service: Cardiovascular;  Laterality: N/A;   A/V FISTULAGRAM N/A 03/03/2020   Procedure: A/V FISTULAGRAM - Right Arm;  Surgeon: Marty Heck, MD;  Location: [place] [facility];  Service: Cardiovascular;  Laterality: N/A;   ABDOMINAL HYSTERECTOMY     AV FISTULA PLACEMENT Right 07/20/2015   Procedure: RADIAL CEPHALIC ARTERIOVENOUS FISTULA CREATION RIGHT ARM;  Surgeon: Angelia Mould, MD;  Location: [facility];  Service: Vascular;  Laterality: Right;   AV FISTULA PLACEMENT Right 11/26/2015   Procedure:  Creation of RIGHT BRACHIO-CEPHALIC arteriovenous fistula;  Surgeon: Angelia Mould, MD;  Location: [facility];  Service: Vascular;  Laterality: Right;   BACK SURGERY     2007 & 2013   [place]  2003, 2005, 2009   1 stent   cardiac stents     CARDIOVERSION N/A 04/24/2022   Procedure: CARDIOVERSION;  Surgeon: Fay Records, MD;  Location: [facility] ENDOSCOPY;  Service: Cardiovascular;  Laterality: N/A;   CATARACT EXTRACTION W/PHACO Right 05/04/2014   Procedure: CATARACT EXTRACTION PHACO AND  INTRAOCULAR LENS PLACEMENT ([place]);  Surgeon: Williams Che, MD;  Location: [facility];  Service: Ophthalmology;  Laterality: Right;  CDE 1.47   CATARACT EXTRACTION W/PHACO Left 07/20/2014   Procedure: CATARACT EXTRACTION WITH PHACO AND INTRAOCULAR LENS PLACEMENT LEFT EYE CDE=1.79;  Surgeon: Williams Che, MD;  Location: [facility];  Service: Ophthalmology;  Laterality: Left;   CERVICAL SPINE SURGERY  2012   8 screws   CORONARY ATHERECTOMY N/A 03/08/2022   Procedure: CORONARY ATHERECTOMY;  Surgeon: Sherren Mocha, MD;  Location: [place] [facility];  Service: Cardiovascular;  Laterality: N/A;   CYSTOSCOPY WITH INJECTION N/A 06/03/2013   Procedure: MACROPLASTIQUE  INJECTION ;  Surgeon: Malka So, MD;  Location: [facility];  Service: Urology;  Laterality: N/A;   FISTULOGRAM Right 10/20/2016   Procedure: FISTULOGRAM WITH POSSIBLE INTERVENTION;  Surgeon: [name], MD;  Location: [facility];  Service: Vascular;  Laterality: Right;   INSERTION OF DIALYSIS CATHETER N/A 11/26/2015   Procedure: INSERTION OF DIALYSIS CATHETER;  Surgeon: Angelia Mould, MD;  Location: [facility];  Service: Vascular;  Laterality: N/A;   INTRAVASCULAR IMAGING/OCT N/A 03/08/2022   Procedure: INTRAVASCULAR IMAGING/OCT;  Surgeon: Sherren Mocha, MD;  Location: [place] [facility];  Service: Cardiovascular;  Laterality: N/A;   LAMINECTOMY  12/29/2011   LEFT HEART CATH AND CORONARY ANGIOGRAPHY N/A 03/06/2022   Procedure: LEFT HEART CATH AND CORONARY ANGIOGRAPHY;  Surgeon: Nelva Bush, MD;  Location: [place] [facility];  Service: Cardiovascular;  Laterality: N/A;   LUMBAR LAMINECTOMY/DECOMPRESSION MICRODISCECTOMY  12/29/2011   Procedure: LUMBAR LAMINECTOMY/DECOMPRESSION MICRODISCECTOMY;  Surgeon: Floyce Stakes, MD;  Location: [facility] [place] [facility] ORS;  Service: Neurosurgery;  Laterality: N/A;  Lumbar Two through Lumbar Five Laminectomies/Cellsaver   PARTIAL HYSTERECTOMY     PERIPHERAL VASCULAR BALLOON ANGIOPLASTY  03/03/2020   Procedure:  PERIPHERAL VASCULAR BALLOON ANGIOPLASTY;  Surgeon: Marty Heck, MD;  Location: [place] [facility];  Service: Cardiovascular;;  Rt arm fistula   PERIPHERAL VASCULAR BALLOON ANGIOPLASTY Right 04/15/2021   Procedure: PERIPHERAL VASCULAR BALLOON ANGIOPLASTY;  Surgeon: Angelia Mould, MD;  Location: [place] [facility];  Service: Cardiovascular;  Laterality: Right;  arm fistula   PERIPHERAL VASCULAR BALLOON ANGIOPLASTY  03/14/2022   Procedure: PERIPHERAL VASCULAR BALLOON ANGIOPLASTY;  Surgeon: Broadus John, MD;  Location: [place] [facility];  Service: Cardiovascular;;  right arm AVF   PERIPHERAL VASCULAR CATHETERIZATION N/A 11/22/2015   Procedure: Fistulagram;  Surgeon: Angelia Mould, MD;  Location: [place] [facility];  Service: Cardiovascular;  Laterality: N/A;   PERIPHERAL VASCULAR INTERVENTION  08/10/2017   Procedure: PERIPHERAL VASCULAR INTERVENTION;  Surgeon: Elam Dutch, MD;  Location: [facility];  Service: Cardiovascular;;   SHOULDER SURGERY Right    Rotator cuff    Prior to Admission medications   Medication Sig Start Date End Date Taking? Authorizing Provider  acetaminophen (TYLENOL) 500 MG tablet Take 1,000 mg by mouth every 6 (six) hours as needed for moderate pain or headache.   Yes [provider]  albuterol (PROVENTIL) (2.5 MG/3ML) 0.083% nebulizer solution Take 3 mLs (2.5 mg total) by nebulization every 6 (six) hours as needed for wheezing or shortness of breath. 12/02/21  Yes [name], MD  apixaban (ELIQUIS) 5 MG TABS tablet Take 1 tablet (5 mg total) by mouth 2 (two) times daily. 04/05/22  Yes Minus Breeding, MD  aspirin EC 81 MG tablet Take 1 tablet (81 mg total) by mouth daily. Swallow whole. 05/17/22  Yes Minus Breeding, MD  atenolol (TENORMIN) 100 MG tablet Take 1 tablet (100 mg total) by mouth at bedtime. 05/10/22  Yes [name], [name], MD  budesonide-formoterol (SYMBICORT) 160-4.5 MCG/ACT inhaler Inhale 2 puffs into the lungs 2  (two) times daily. 05/03/22  Yes [name], MD  calcium acetate (PHOSLO) 667 MG capsule Take 667-1,334 mg by mouth See admin instructions. Take 1334 mg with meals 3 times daily and 667 mg with snacks   Yes [provider]  Carboxymethylcellul-Glycerin (LUBRICATING EYE DROPS OP) Place 1 drop into both eyes daily as needed (dry eyes).   Yes [provider]  cetirizine (ZYRTEC) 10 MG tablet Take 1 tablet (10 mg total) by mouth daily as needed for allergies. Patient taking differently: Take 10 mg by mouth daily. 02/08/22  Yes [name], MD  diltiazem (CARDIZEM CD) 240 MG 24 hr capsule Take 1 capsule (240 mg total) by mouth daily. 05/17/22  Yes Minus Breeding, MD  gabapentin (NEURONTIN) 400 MG capsule Take 2 capsules (800 mg total) by mouth See admin instructions. Take it after HD Patient taking differently: Take 800 mg by mouth See admin instructions. Take after dialysis 03/22/22  Yes [name], MD  hydrOXYzine (ATARAX) 50 MG tablet Take 1 tablet (50 mg total) by mouth 3 (three) times daily as needed for anxiety. 03/22/22  Yes [name], [name], MD  insulin aspart (NOVOLOG FLEXPEN) 100 UNIT/ML FlexPen Inject 1 Units into the skin 3 (three) times daily with meals. < 150: 0 units. 150-199:1 unit. 200-249: 2 units. 250-299: 3 units. 300-349: 4 units. Over 350: 5 units 05/19/22  Yes Rakes, Connye Burkitt, FNP  insulin degludec (TRESIBA FLEXTOUCH) 100 UNIT/ML FlexTouch Pen Inject 20 Units into the skin at bedtime. Via novo nordisk patient assistance program Patient taking differently: Inject 30 Units into the skin at bedtime. Via novo nordisk patient assistance program 05/03/22  Yes [name], MD  lidocaine-prilocaine (EMLA) cream Apply 1 application. topically as needed (dialysis).   Yes [provider]  multivitamin (RENA-VIT) TABS tablet Take 1 tablet by mouth daily. 05/13/22  Yes [provider]  nitroGLYCERIN (NITROSTAT) 0.4 MG SL tablet Place 1 tablet (0.4 mg total)  under the tongue every 5 (five) minutes as needed for chest pain. 11/09/21  Yes Minus Breeding, MD  omeprazole (PRILOSEC) 20 MG capsule Take 1 capsule (20 mg total) by mouth daily. On an empty stomach 05/10/22  Yes [name], [name], MD  ondansetron (ZOFRAN) 4 MG tablet TAKE 1 TABLET BY MOUTH EVERY 8 HOURS AS NEEDED FOR NAUSEA AND VOMITING Patient taking differently: Take 4 mg by mouth every 8 (eight) hours as needed for nausea or vomiting. 05/19/22  Yes Rakes, Connye Burkitt, FNP  polyethylene glycol (MIRALAX / GLYCOLAX) 17 g packet Take 17 g by mouth daily as needed for moderate constipation or mild constipation. 03/10/22  Yes Pokhrel, [place], MD  QUEtiapine (SEROQUEL) 200 MG tablet Take 1 tablet (200 mg total) by mouth at bedtime. For sleep and for anxiety 03/17/22  Yes [name], [name], MD  rOPINIRole (REQUIP) 1 MG tablet Take 1 tablet (1 mg total) by mouth at bedtime. For leg cramps 09/15/21  Yes [name], [name], MD  rosuvastatin (CRESTOR) 5 MG tablet Take 1 tablet (5 mg total) by mouth daily. For cholesterol Patient taking differently: Take 5 mg by mouth at bedtime. For cholesterol 12/12/21  Yes [name], [name], MD  Semaglutide,0.25 or 0.5MG /DOS, (OZEMPIC, 0.25 OR 0.5 MG/DOSE,) 2 MG/1.5ML SOPN Inject 0.5 mg into the skin every Friday.   Yes [provider]  albuterol (VENTOLIN HFA) 108 (90 Base) MCG/ACT inhaler Inhale 2 puffs into the lungs every 6 (six) hours as needed for wheezing or shortness of breath. 12/02/21   [name], MD  Continuous Blood Gluc Receiver (FREESTYLE LIBRE 2 READER) DEVI USE TO TEST BLOOD SUGAR CONTINUOUSLY 11/15/21   [name], MD  Continuous Blood Gluc Sensor (FREESTYLE LIBRE 2 SENSOR) MISC Use multiple times daily to track glucose to prevent highs and lows as a complication of dialysis Dx:Z99.2, E11.59 09/15/21   [name], MD  dextromethorphan-guaiFENesin (MUCINEX DM) 30-600 MG 12hr tablet Take 1 tablet by mouth 2 (two) times daily. Patient not taking: Reported on  05/30/2022 04/28/22   Barton Dubois, MD  glucose blood (ONETOUCH ULTRA) test strip Use to test blood sugar 3 times daily as directed. DX: E11.9 09/13/20   [name], MD  predniSONE (DELTASONE) 10 MG tablet Take 5 daily for 3 days  followed by 4,3,2 and 1 for 3 days each. Patient not taking: Reported on 05/30/2022 05/10/22   [name], MD    Current Facility-Administered Medications  Medication Dose Route Frequency Provider Last Rate Last Admin   0.9 %  sodium chloride infusion (Manually program via Guardrails IV Fluids)   Intravenous Once Hosie Poisson, MD       0.9 %  sodium chloride infusion  10 mL/hr Intravenous Once Zierle-Ghosh, Asia B, DO       acetaminophen (TYLENOL) tablet 650 mg  650 mg Oral Q6H PRN Zierle-Ghosh, Asia B, DO   650 mg at 05/31/22 0820   Or   acetaminophen (TYLENOL) suppository 650 mg  650 mg Rectal Q6H PRN Zierle-Ghosh, Asia B, DO       albuterol (PROVENTIL) (2.5 MG/3ML) 0.083% nebulizer solution 2.5 mg  2.5 mg Nebulization Q6H PRN Zierle-Ghosh, Asia B, DO       atenolol (TENORMIN) tablet 100 mg  100 mg Oral QHS Zierle-Ghosh, Asia B, DO   100 mg at 05/30/22 2325   calcium acetate (PHOSLO) capsule 1,334 mg  1,334 mg Oral TID with meals Zierle-Ghosh, Asia B, DO       calcium acetate (PHOSLO) capsule 667 mg  667 mg Oral PRN Zierle-Ghosh, Asia B, DO       diltiazem (CARDIZEM CD) 24 hr capsule 240 mg  240 mg Oral Daily Zierle-Ghosh, Asia B, DO   240 mg at 05/31/22 0821   [START ON 06/01/2022] gabapentin (NEURONTIN) capsule 800 mg  800 mg Oral Q T,Th,Sa-HD Zierle-Ghosh, Asia B, DO       hydrOXYzine (ATARAX) tablet 50 mg  50 mg Oral TID PRN Zierle-Ghosh, Asia B, DO       insulin aspart (novoLOG) injection 0-15 Units  0-15 Units Subcutaneous TID WC Zierle-Ghosh, Asia B, DO   5 Units at 05/31/22 0820   insulin aspart (novoLOG) injection 0-5 Units  0-5 Units Subcutaneous QHS Zierle-Ghosh, Asia B, DO       insulin detemir (LEVEMIR) injection 15 Units  15 Units Subcutaneous QHS  Zierle-Ghosh, Asia B, DO   15 Units at 05/31/22 0129   mometasone-formoterol (DULERA) 200-5 MCG/ACT inhaler 2 puff  2 puff Inhalation BID Zierle-Ghosh, Asia B, DO   2 puff at 05/31/22 0739   morphine (PF) 2 MG/ML injection 2 mg  2 mg Intravenous Q2H PRN Zierle-Ghosh, Asia B, DO       ondansetron (ZOFRAN) tablet 4 mg  4 mg Oral Q6H PRN Zierle-Ghosh, Asia B, DO       Or   ondansetron (ZOFRAN) injection 4 mg  4 mg Intravenous Q6H PRN Zierle-Ghosh, Asia B, DO       oxyCODONE (Oxy IR/ROXICODONE) immediate release tablet 5 mg  5 mg Oral Q4H PRN Zierle-Ghosh, Asia B, DO       pantoprazole (PROTONIX) injection 40 mg  40 mg Intravenous Q12H Zierle-Ghosh, Asia B, DO   40 mg at 05/31/22 0820   potassium chloride (KLOR-CON) packet 40 mEq  40 mEq Oral Once Zierle-Ghosh, Asia B, DO       QUEtiapine (SEROQUEL) tablet 200 mg  200 mg Oral QHS Zierle-Ghosh, Asia B, DO   200 mg at 05/30/22 2327   rOPINIRole (REQUIP) tablet 1 mg  1 mg Oral QHS Zierle-Ghosh, Asia B, DO   1 mg at 05/30/22 2328   rosuvastatin (CRESTOR) tablet 5 mg  5 mg Oral QHS Zierle-Ghosh, Asia B, DO   5 mg at 05/30/22 2328    Allergies as  of 05/30/2022 - Review Complete 05/30/2022  Allergen Reaction Noted   Azithromycin  09/13/2017   Ace inhibitors Cough 03/29/2011   Clopidogrel bisulfate Other (See Comments)    Codeine Other (See Comments)    Lisinopril Cough 04/20/2014   Penicillins Other (See Comments)     Family History  Problem Relation Age of Onset   Heart disease Other    Hypertension Father    Cancer Father    Hypertension Mother    Hypertension Daughter    Pancreatitis Brother    Hypertension Daughter    Diabetes Daughter     Social History   Socioeconomic History   Marital status: Widowed    Spouse name: Not on file   Number of children: 2   Years of education: GED   Highest education level: GED or equivalent  Occupational History   Occupation: Disabled  Tobacco Use   Smoking status: Former    Packs/day: 1.00     Years: 30.00    Total pack years: 30.00    Types: Cigarettes    Quit date: 12/04/2001    Years since quitting: 20.5    Passive exposure: Never   Smokeless tobacco: Never  Vaping Use   Vaping Use: Never used  Substance and Sexual Activity   Alcohol use: No    Alcohol/week: 6.0 standard drinks of alcohol    Types: 6 Cans of beer per week   Drug use: No   Sexual activity: Not Currently    Birth control/protection: Surgical  Other Topics Concern   Not on file  Social History Narrative   Lives in [place] with mother   Daughters live nearby   Social Determinants of Health   Financial Resource Strain: [place]  (12/16/2021)   Overall Financial Resource Strain (CARDIA)    Difficulty of Paying Living Expenses: Not hard at all  Food Insecurity: No Food Insecurity (12/16/2021)   Hunger Vital Sign    Worried About Running Out of Food in the Last Year: Never true    [place] in the Last Year: Never true  Transportation Needs: No Transportation Needs (12/16/2021)   PRAPARE - Hydrologist (Medical): No    Lack of Transportation (Non-Medical): No  Physical Activity: Inactive (12/16/2021)   Exercise Vital Sign    Days of Exercise per Week: 0 days    Minutes of Exercise per Session: 0 min  Stress: No Stress Concern Present (12/16/2021)   [place]    Feeling of Stress : Not at all  Social Connections: Moderately Isolated (12/16/2021)   Social Connection and Isolation Panel [NHANES]    Frequency of Communication with Friends and Family: More than three times a week    Frequency of Social Gatherings with Friends and Family: More than three times a week    Attends Religious Services: More than 4 times per year    Active Member of [employer] or Organizations: No    Attends Archivist Meetings: Never    Marital Status: Widowed  Intimate Partner Violence: Not At Risk (12/16/2021)    Humiliation, Afraid, Rape, and Kick questionnaire    Fear of Current or Ex-Partner: No    Emotionally Abused: No    Physically Abused: No    Sexually Abused: No   Review of Systems: Gen: Denies fever, chills, +loss of appetite +weight loss CV: Denies chest pain, syncope, edema +heart palpitations,  Resp: Denies  cough, wheezing +  shortness of breath with rest GI: denies melena, hematochezia, nausea, vomiting, diarrhea, odynophagia,  +early satiety +weight loss +constipation +dysphagia,  GU : Denies urinary burning, urinary frequency, urinary incontinence.  MS: Denies joint pain,swelling, cramping Derm: Denies rash, itching, dry skin Psych: Denies depression, anxiety,confusion, or memory loss Heme: Denies bruising, bleeding, and enlarged lymph nodes.  Physical Exam: Vital signs in last 24 hours: Temp:  [98 F (36.7 C)-98.6 F (37 C)] 98.2 F (36.8 C) (06/28 0921) Pulse Rate:  [83-99] 89 (06/28 0921) Resp:  [10-21] 20 (06/28 0921) BP: (93-147)/(49-69) 110/49 (06/28 0921) SpO2:  [93 %-100 %] 94 % (06/28 0921) Weight:  [80 kg-80.3 kg] 80 kg (06/28 0000) Last BM Date : 05/30/22 General:   Alert,  Well-developed, well-nourished, pleasant and cooperative in NAD Head:  Normocephalic and atraumatic. Eyes:  Sclera clear, no icterus.   Conjunctiva pink. Ears:  Normal auditory acuity. Nose:  No deformity, discharge,  or lesions. Mouth:  No deformity or lesions, dentition normal. Lungs:  Clear throughout to auscultation.   No wheezes, crackles, or rhonchi. No acute distress. Heart:  Regular rate and rhythm; no murmurs, clicks, rubs,  or gallops. Abdomen:  Soft,  and nondistended. Mil TTP of epigastric area and LUQ. No masses, hepatosplenomegaly or hernias noted. Normal bowel sounds, without guarding, and without rebound.   Rectal:  Deferred until time of colonoscopy.   Msk:  Symmetrical without gross deformities. Normal posture. Pulses:  Normal pulses noted. Extremities:  Without clubbing  or edema. Neurologic:  Alert and  oriented x4;  grossly normal neurologically. Skin:  Intact without significant lesions or rashes. Psych:  Alert and cooperative. Normal mood and affect.  Intake/Output from previous day: 06/27 0701 - 06/28 0700 In: 560 [P.O.:240; Blood:320] Out: 0  Intake/Output this shift: No intake/output data recorded.  Lab Results: Recent Labs    05/30/22 1803 05/31/22 0520  WBC 11.7* 8.9  HGB 7.6* 7.7*  HCT 23.8* 23.8*  PLT 178 149*   BMET Recent Labs    05/30/22 1803 05/31/22 0520  NA 134* 131*  K 3.4* 2.8*  CL 91* 92*  CO2 30 27  GLUCOSE 233* 278*  BUN 22 28*  CREATININE 5.01* 5.89*  CALCIUM 8.3* 7.9*   LFT Recent Labs    05/31/22 0520  PROT 5.3*  ALBUMIN 2.4*  AST 23  ALT 30  ALKPHOS 63  BILITOT 0.9    Studies/Results: DG Chest 2 View  Result Date: 05/30/2022 CLINICAL DATA:  Chest pain EXAM: CHEST - 2 VIEW COMPARISON:  04/25/2022, 09/15/2020, 03/04/2022 FINDINGS: Cardiomegaly with mild central congestion. Mild diffuse interstitial opacity without significant change. Aortic atherosclerosis. No pneumothorax. Hardware in the cervical spine. IMPRESSION: Cardiomegaly with slight central congestion. Mild diffuse interstitial opacity without focal airspace disease. Electronically Signed   By: Donavan Foil M.D.   On: 05/30/2022 17:48    Impression: [name] is a 71 y.o. 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 GI consulted for further evaluation.   Anemia secondary to GI bleed:  Hgb 7.6 on admission, 7.7 s/p 1 unit PRBC transfusion. Dr. Jenetta Downer rounded on patient this morning and recommended proceeding with a second unit of PRBCs as hemoglobin had very  minimal improvement s/p  1st unit last night. Patient follows with [facility], last colonoscopy was 09/2021 with findings of sigmoid diverticulosis and internal hemorrhoids. She has never had an EGD. Notably, despite her ESRD, BUN appears to be WNL at baseline. She had bump of BUN to 35 one month ago when hgb started trending down. BUN up to 28 today, though hard to say if this is related to possible UGIB or ESRD. She does report recent decreased appetite, weight loss of about 10 pounds and some early satiety.  As recent colonoscopy was without findings to suggest lower GI source of bleeding, recommend proceeding with EGD for further evaluation of anemia/GI bleeding as we cannot rule out PUD, gastritis, duodenitis, AVMs, or malignancy. given her co morbidities, she is certainly at risk for PUD and AVMs.  Last Eliquis dose was 1300 on 05/30/22, Plans to potentially proceed with upper endoscopy tomorrow for further evaluation of anemia after HD as long as labs/VSS are stable.   Indications, risks and benefits of procedure discussed in detail with patient. Patient verbalized understanding and is in agreement to proceed with EGD at this time.    Plan: Continue to hold Eliquis Trend h&h, transfuse for hgb <7 Monitor for overt GI bleeding PPI BID EGD possibly tomorrow after HD, NPO at midnight   LOS: 1 day    05/31/2022, 10:06 AM Orson Rho L. Alver Sorrow, MSN, APRN, AGNP-C Adult-Gerontology Nurse Practitioner [facility]

## 2022-05-31 NOTE — Consult Note (Signed)
Cardiology Consultation:   Patient ID: [name] MRN: [id number]; DOB: [birth year]  Admit date: 05/30/2022 Date of Consult: 05/31/2022  PCP:  [name], MD   [facility] Providers Cardiologist:  Minus Breeding, MD        Patient Profile:   [name] is a 71 y.o. female with a hx of CAD, afib, ESRD, who is being seen 05/31/2022 for the evaluation of palpitations and chest pain at the request of Dr.Akula  History of Present Illness:   Ms. [name] 71 yo female histrory of CAD with multiple stents most recently 03/2022 DES to LAD and DES to RCA in setting of NSTEMI, chronic diastolic HF, PAF, ESRD, HTN, COPD, presented with chest pain and palpitations. Reports fluttering in chest with associated pressure that can last for hours at a time. No other chest pains. During ER eval found to be anemic with Hgb 7.6, admitted to medicine team for [facility]. Cardiology consulted for symptomatic afib.     K 3.4 BUN 22 Cr 5.01 WBC 11.7 Hgb 7.6 (Hgb was 11 1 month ago) FOBT + Trop 89-->92 CXR slight central congestion EKG afib, no acute ischemic changes    03/2022 cath: prox LAD 20%, mid LAD 85%, ramus 30%, LCX 25%, prox RCA 99%.  03/2022 echo: LVEF 38%, indet diastolic, mod to severe RV dysfunction, severe pulm HTN Past Medical History:  Diagnosis Date   Acute on chronic diastolic CHF (congestive heart failure) ([place]) 03/05/2015   Grade 2. EF 60-65%   Anemia    hx of   Anxiety    Arthritis    CAD (coronary artery disease)    s/p Stenting in 2005;  [place] 7/09: Vigorous LV function, 2-3+ MR, RI 40%, RI stent patent, proximal-mid RCA 40-50%;  Echo 7/09:  EF 55-65%, trivial MR;  [place] 11/18/12: EF 66%, normal LV wall motion, mild to moderate anterior ischemia - reviewed by [facility] and felt to rep breast atten (low risk);  Echo 6/14: mild LVH, EF 55-65%, Gr 2 DD, PASP 44, trivial eff    Chronic kidney disease    CREATININE IS UP--GOING TO KIDNEY MD    Chronic neck pain    COPD (chronic  obstructive pulmonary disease) (HCC)    Degenerative joint disease    left shoulder   Diabetic neuropathy (HCC)    Diverticulosis    Esophageal stricture    GERD (gastroesophageal reflux disease)    Hyperlipidemia    Hypertension    IDDM (insulin dependent diabetes mellitus)    Pericardial effusion 03/06/2015   Very small per echo ; pleural nodules seen on CT chest.   Peripheral vascular disease ([place])    Stroke ([place])    "mini- years ago"   Vitamin D deficiency     Past Surgical History:  Procedure Laterality Date   A/V FISTULAGRAM N/A 08/10/2017   Procedure: A/V Fistulagram - Right Arm;  Surgeon: Elam Dutch, MD;  Location: [place] CV LAB;  Service: Cardiovascular;  Laterality: N/A;   A/V FISTULAGRAM N/A 03/03/2020   Procedure: A/V FISTULAGRAM - Right Arm;  Surgeon: Marty Heck, MD;  Location: [place] CV LAB;  Service: Cardiovascular;  Laterality: N/A;   ABDOMINAL HYSTERECTOMY     AV FISTULA PLACEMENT Right 07/20/2015   Procedure: RADIAL CEPHALIC ARTERIOVENOUS FISTULA CREATION RIGHT ARM;  Surgeon: Angelia Mould, MD;  Location: [place];  Service: Vascular;  Laterality: Right;   AV FISTULA PLACEMENT Right 11/26/2015   Procedure:  Creation of RIGHT BRACHIO-CEPHALIC arteriovenous  fistula;  Surgeon: Angelia Mould, MD;  Location: [facility] OR;  Service: Vascular;  Laterality: Right;   BACK SURGERY     2007 & 2013   [place]  2003, 2005, 2009   1 stent   cardiac stents     CARDIOVERSION N/A 04/24/2022   Procedure: CARDIOVERSION;  Surgeon: Fay Records, MD;  Location: [facility] ENDOSCOPY;  Service: Cardiovascular;  Laterality: N/A;   CATARACT EXTRACTION W/PHACO Right 05/04/2014   Procedure: CATARACT EXTRACTION PHACO AND INTRAOCULAR LENS PLACEMENT ([place]);  Surgeon: Williams Che, MD;  Location: [facility];  Service: Ophthalmology;  Laterality: Right;  CDE 1.47   CATARACT EXTRACTION W/PHACO Left 07/20/2014   Procedure: CATARACT  EXTRACTION WITH PHACO AND INTRAOCULAR LENS PLACEMENT LEFT EYE CDE=1.79;  Surgeon: Williams Che, MD;  Location: [facility];  Service: Ophthalmology;  Laterality: Left;   CERVICAL SPINE SURGERY  2012   8 screws   CORONARY ATHERECTOMY N/A 03/08/2022   Procedure: CORONARY ATHERECTOMY;  Surgeon: Sherren Mocha, MD;  Location: [place] CV LAB;  Service: Cardiovascular;  Laterality: N/A;   CYSTOSCOPY WITH INJECTION N/A 06/03/2013   Procedure: MACROPLASTIQUE  INJECTION ;  Surgeon: Malka So, MD;  Location: [facility] OR[facility];  Service: Urology;  Laterality: N/A;   FISTULOGRAM Right 10/20/2016   Procedure: FISTULOGRAM WITH POSSIBLE INTERVENTION;  Surgeon: [name], MD;  Location: [facility];  Service: Vascular;  Laterality: Right;   INSERTION OF DIALYSIS CATHETER N/A 11/26/2015   Procedure: INSERTION OF DIALYSIS CATHETER;  Surgeon: Angelia Mould, MD;  Location: [place];  Service: Vascular;  Laterality: N/A;   INTRAVASCULAR IMAGING/OCT N/A 03/08/2022   Procedure: INTRAVASCULAR IMAGING/OCT;  Surgeon: Sherren Mocha, MD;  Location: [place] CV LAB;  Service: Cardiovascular;  Laterality: N/A;   LAMINECTOMY  12/29/2011   LEFT HEART CATH AND CORONARY ANGIOGRAPHY N/A 03/06/2022   Procedure: LEFT HEART CATH AND CORONARY ANGIOGRAPHY;  Surgeon: Nelva Bush, MD;  Location: [place] CV LAB;  Service: Cardiovascular;  Laterality: N/A;   LUMBAR LAMINECTOMY/DECOMPRESSION MICRODISCECTOMY  12/29/2011   Procedure: LUMBAR LAMINECTOMY/DECOMPRESSION MICRODISCECTOMY;  Surgeon: Floyce Stakes, MD;  Location: [place] NEURO ORS;  Service: Neurosurgery;  Laterality: N/A;  Lumbar Two through Lumbar Five Laminectomies/Cellsaver   PARTIAL HYSTERECTOMY     PERIPHERAL VASCULAR BALLOON ANGIOPLASTY  03/03/2020   Procedure: PERIPHERAL VASCULAR BALLOON ANGIOPLASTY;  Surgeon: Marty Heck, MD;  Location: [place] CV LAB;  Service: Cardiovascular;;  Rt arm fistula   PERIPHERAL VASCULAR BALLOON ANGIOPLASTY Right 04/15/2021    Procedure: PERIPHERAL VASCULAR BALLOON ANGIOPLASTY;  Surgeon: Angelia Mould, MD;  Location: [place] CV LAB;  Service: Cardiovascular;  Laterality: Right;  arm fistula   PERIPHERAL VASCULAR BALLOON ANGIOPLASTY  03/14/2022   Procedure: PERIPHERAL VASCULAR BALLOON ANGIOPLASTY;  Surgeon: Broadus John, MD;  Location: [place] CV LAB;  Service: Cardiovascular;;  right arm AVF   PERIPHERAL VASCULAR CATHETERIZATION N/A 11/22/2015   Procedure: Fistulagram;  Surgeon: Angelia Mould, MD;  Location: [place] CV LAB;  Service: Cardiovascular;  Laterality: N/A;   PERIPHERAL VASCULAR INTERVENTION  08/10/2017   Procedure: PERIPHERAL VASCULAR INTERVENTION;  Surgeon: Elam Dutch, MD;  Location: [place] CV LAB;  Service: Cardiovascular;;   SHOULDER SURGERY Right    Rotator cuff      Inpatient Medications: Scheduled Meds:  atenolol  100 mg Oral QHS   calcium acetate  1,334 mg Oral TID with meals   diltiazem  240 mg  Oral Daily   [START ON 06/01/2022] gabapentin  800 mg Oral Q T,Th,Sa-HD   insulin aspart  0-15 Units Subcutaneous TID WC   insulin aspart  0-5 Units Subcutaneous QHS   insulin detemir  15 Units Subcutaneous QHS   mometasone-formoterol  2 puff Inhalation BID   pantoprazole (PROTONIX) IV  40 mg Intravenous Q12H   potassium chloride  40 mEq Oral Once   QUEtiapine  200 mg Oral QHS   rOPINIRole  1 mg Oral QHS   rosuvastatin  5 mg Oral QHS   Continuous Infusions:  sodium chloride     PRN Meds: acetaminophen **OR** acetaminophen, albuterol, calcium acetate, hydrOXYzine, morphine injection, ondansetron **OR** ondansetron (ZOFRAN) IV, oxyCODONE  Allergies:    Allergies  Allergen Reactions   Azithromycin     Prolonged QT   Ace Inhibitors Cough   Clopidogrel Bisulfate Other (See Comments)    Sick, Headache, "Felt terrible"   Codeine Other (See Comments)    hallucinations   Lisinopril Cough   Penicillins Other (See Comments)    Unknown childhood  reaction Did it involve swelling of the face/tongue/throat, SOB, or low BP? Unknown Did it involve sudden or severe rash/hives, skin peeling, or any reaction on the inside of your mouth or nose? Unknown Did you need to seek medical attention at a hospital or doctor's office? Unknown When did it last happen?      childhood allergy If all above answers are "NO", may proceed with cephalosporin use.      Social History:   Social History   Socioeconomic History   Marital status: Widowed    Spouse name: Not on file   Number of children: 2   Years of education: GED   Highest education level: GED or equivalent  Occupational History   Occupation: Disabled  Tobacco Use   Smoking status: Former    Packs/day: 1.00    Years: 30.00    Total pack years: 30.00    Types: Cigarettes    Quit date: 12/04/2001    Years since quitting: 20.5    Passive exposure: Never   Smokeless tobacco: Never  Vaping Use   Vaping Use: Never used  Substance and Sexual Activity   Alcohol use: No    Alcohol/week: 6.0 standard drinks of alcohol    Types: 6 Cans of beer per week   Drug use: No   Sexual activity: Not Currently    Birth control/protection: Surgical  Other Topics Concern   Not on file  Social History Narrative   Lives in [place] with mother   Daughters live nearby   Social Determinants of Health   Financial Resource Strain: [place]  (12/16/2021)   Overall Financial Resource Strain (CARDIA)    Difficulty of Paying Living Expenses: Not hard at all  Food Insecurity: No Food Insecurity (12/16/2021)   Hunger Vital Sign    Worried About Running Out of Food in the Last Year: Never true    [place] in the Last Year: Never true  Transportation Needs: No Transportation Needs (12/16/2021)   PRAPARE - Hydrologist (Medical): No    Lack of Transportation (Non-Medical): No  Physical Activity: Inactive (12/16/2021)   Exercise Vital Sign    Days of Exercise per Week:  0 days    Minutes of Exercise per Session: 0 min  Stress: No Stress Concern Present (12/16/2021)   [place]    Feeling of  Stress : Not at all  Social Connections: Moderately Isolated (12/16/2021)   Social Connection and Isolation Panel [NHANES]    Frequency of Communication with Friends and Family: More than three times a week    Frequency of Social Gatherings with Friends and Family: More than three times a week    Attends Religious Services: More than 4 times per year    Active Member of [employer] or Organizations: No    Attends Archivist Meetings: Never    Marital Status: Widowed  Intimate Partner Violence: Not At Risk (12/16/2021)   Humiliation, Afraid, Rape, and Kick questionnaire    Fear of Current or Ex-Partner: No    Emotionally Abused: No    Physically Abused: No    Sexually Abused: No    Family History:    Family History  Problem Relation Age of Onset   Heart disease Other    Hypertension Father    Cancer Father    Hypertension Mother    Hypertension Daughter    Pancreatitis Brother    Hypertension Daughter    Diabetes Daughter      ROS:  Please see the history of present illness.   All other ROS reviewed and negative.     Physical Exam/Data:   Vitals:   05/31/22 0921 05/31/22 1015 05/31/22 1050 05/31/22 1052  BP: (!) 110/49 (!) 93/55 (!) 106/50 (!) 106/50  Pulse: 89 78 76 76  Resp: 20 20 20 20   Temp: 98.2 F (36.8 C) 98.6 F (37 C) 97.7 F (36.5 C) 97.7 F (36.5 C)  TempSrc: Oral Oral  Oral  SpO2: 94% 96%  98%  Weight:      Height:        Intake/Output Summary (Last 24 hours) at 05/31/2022 1149 Last data filed at 05/31/2022 0256 Gross per 24 hour  Intake 560 ml  Output 0 ml  Net 560 ml      05/31/2022   12:00 AM 05/30/2022    5:20 PM 05/17/2022    1:44 PM  Last 3 Weights  Weight (lbs) 176 lb 6.4 oz 177 lb 178 lb 9.6 oz  Weight (kg) 80.015 kg 80.287 kg 81.012 kg      Body mass index is 31.25 kg/m.  General:  Well nourished, well developed, in no acute distress HEENT: normal Neck: no JVD Vascular: No carotid bruits; Distal pulses 2+ bilaterally Cardiac:  irreg Lungs:  clear to auscultation bilaterally, no wheezing, rhonchi or rales  Abd: soft, nontender, no hepatomegaly  Ext: no edema Musculoskeletal:  No deformities, BUE and BLE strength normal and equal Skin: warm and dry  Neuro:  CNs 2-12 intact, no focal abnormalities noted Psych:  Normal affect     Laboratory Data:  High Sensitivity Troponin:   Recent Labs  Lab 05/30/22 1803 05/30/22 1927  TROPONINIHS 89* 92*     Chemistry Recent Labs  Lab 05/30/22 1803 05/31/22 0520  NA 134* 131*  K 3.4* 2.8*  CL 91* 92*  CO2 30 27  GLUCOSE 233* 278*  BUN 22 28*  CREATININE 5.01* 5.89*  CALCIUM 8.3* 7.9*  MG  --  2.0  GFRNONAA 9* 7*  ANIONGAP 13 12    Recent Labs  Lab 05/31/22 0520  PROT 5.3*  ALBUMIN 2.4*  AST 23  ALT 30  ALKPHOS 63  BILITOT 0.9   Lipids No results for input(s): "CHOL", "TRIG", "HDL", "LABVLDL", "LDLCALC", "CHOLHDL" in the last 168 hours.  Hematology Recent Labs  Lab 05/30/22 1803 05/31/22 3716  WBC 11.7* 8.9  RBC 2.72* 2.77*  HGB 7.6* 7.7*  HCT 23.8* 23.8*  MCV 87.5 85.9  MCH 27.9 27.8  MCHC 31.9 32.4  RDW 19.2* 17.7*  PLT 178 149*   Thyroid No results for input(s): "TSH", "FREET4" in the last 168 hours.  BNPNo results for input(s): "BNP", "PROBNP" in the last 168 hours.  DDimer No results for input(s): "DDIMER" in the last 168 hours.   Radiology/Studies:  DG Chest 2 View  Result Date: 05/30/2022 CLINICAL DATA:  Chest pain EXAM: CHEST - 2 VIEW COMPARISON:  04/25/2022, 09/15/2020, 03/04/2022 FINDINGS: Cardiomegaly with mild central congestion. Mild diffuse interstitial opacity without significant change. Aortic atherosclerosis. No pneumothorax. Hardware in the cervical spine. IMPRESSION: Cardiomegaly with slight central congestion. Mild diffuse  interstitial opacity without focal airspace disease. Electronically Signed   By: Donavan Foil M.D.   On: 05/30/2022 17:48     Assessment and Plan:   Afib -  from 6/14 note referred to EP to consider ablation - rate control with diltiazem, atenolol. Anemia can drive some degree of tachycardia. From tele review she is 59s to 93s currently in afib - anticoag on hold given symptomatic anemia - in general poor candidate for any antiarrhythmic given CAD, ESRD, COPD. Would rate control for now and continue plans for outpatient EP evaluation. May be worth considering watchman eval in the future given chronic bleeding from dialysis site and this admit with symptomatic anemia possible GI bleed   2.CAD -  histrory of CAD with multiple stents most recently 03/2022 DES to LAD and DES to RCA in setting of NSTEMI, - interventional given her bleeding risks had recommended eliquis,brillinta 90mg  bid x 3 months, and then replacing the brillinta with ASA - mild trop in setting of ESRD, symptomatic anemia. EKG without ischemic changes - chest pain is only associated with palpitations.  - no plans for ischemic testing at this time.   3.Anemia - Hgb 11 in May, 7.6 on admission - has had chronic issues with bleeding from dialysis site, also FOBT + this admission - transfused 1 unit - eliquis and ASA held on admission by primary team  - difficult situation. Less than 3 months out from stenting to LAD and RCA in setting of ACS admitted with symptomatic anemia while on eliquis and ASA.  - in absence of hemodynamically unstable bleeding should remain on at least some form of antiplatelet therapy given high risk of stent thrombosis. She has plavix allergy. I would favor starting brillinta as single agent, can hold eliquis for now as monitor H&H.    4. ESRD - per neprhology       For questions or updates, please contact [place] Please consult [email or web address] for contact info under     Signed, Carlyle Dolly, MD  05/31/2022 11:49 AM

## 2022-05-31 NOTE — Progress Notes (Signed)
[facility]                                                                               [name], is a 71 y.o. female, DOB - 02-21-1951, ZOX:[id number] Admit date - 05/30/2022    Outpatient Primary MD for the patient is [name], MD  LOS - 1  days    Brief summary   [name] is a 71 y.o. female with medical history significant of CHF with grade 2 diastolic dysfunction, anemia, coronary artery disease, end-stage renal disease with dialysis Tuesday Thursday Saturday, COPD, diabetic neuropathy, GERD, hyperlipidemia, hypertension, insulin-dependent diabetes, stroke, peripheral vascular disease, and more presents ED with a chief complaint of chest pain.    Assessment & Plan    71 year old admitted for evaluation of symptomatic anemia.  GI and nephrology on board.  Continue to monitor.   Estimated body mass index is 31.25 kg/m as calculated from the following:   Height as of this encounter: 5\' 3"  (1.6 m).   Weight as of this encounter: 80 kg.  Code Status: full code.  DVT Prophylaxis:  SCDs Start: 05/30/22 2214   Level of Care: Level of care: Telemetry Family Communication: none at bedside.   Disposition Plan:     Remains inpatient appropriate: EGD scheduled for tomorrow.   Procedures:  None.   Consultants:   Cardiology Nephrology.   Antimicrobials:   Anti-infectives (From admission, onward)    None        Medications  Scheduled Meds:  atenolol  100 mg Oral QHS   calcium acetate  1,334 mg Oral TID with meals   diltiazem  240 mg Oral Daily   [START ON 06/01/2022] gabapentin  800 mg Oral Q T,Th,Sa-HD   insulin aspart  0-15 Units Subcutaneous TID WC   insulin aspart  0-5 Units Subcutaneous QHS   insulin detemir  15 Units Subcutaneous QHS   mometasone-formoterol  2 puff Inhalation BID   pantoprazole (PROTONIX) IV  40 mg Intravenous Q12H   potassium chloride  40 mEq Oral Once   QUEtiapine  200 mg Oral QHS   rOPINIRole  1 mg  Oral QHS   rosuvastatin  5 mg Oral QHS   ticagrelor  90 mg Oral BID   Continuous Infusions:  sodium chloride     PRN Meds:.acetaminophen **OR** acetaminophen, albuterol, calcium acetate, hydrOXYzine, morphine injection, ondansetron **OR** ondansetron (ZOFRAN) IV, oxyCODONE    Subjective:   [name] was seen and examined today.   Objective:   Vitals:   05/31/22 1015 05/31/22 1050 05/31/22 1052 05/31/22 1318  BP: (!) 93/55 (!) 106/50 (!) 106/50 (!) 112/50  Pulse: 78 76 76 77  Resp: 20 20 20 20   Temp: 98.6 F (37 C) 97.7 F (36.5 C) 97.7 F (36.5 C) 98.3 F (36.8 C)  TempSrc: Oral  Oral Oral  SpO2: 96%  98% 97%  Weight:      Height:        Intake/Output Summary (Last 24 hours) at 05/31/2022 1326 Last data filed at 05/31/2022 0256 Gross per 24 hour  Intake 560 ml  Output 0 ml  Net 560 ml   Filed  Weights   05/30/22 1720 05/31/22 0000  Weight: 80.3 kg 80 kg     Exam General exam: Appears calm and comfortable  Respiratory system: Clear to auscultation. Respiratory effort normal. Cardiovascular system: S1 & S2 heard, RRR. No JVD,  No pedal edema. Gastrointestinal system: Abdomen is nondistended, soft and nontender.  Normal bowel sounds heard. Central nervous system: Alert and oriented. No focal neurological deficits. Extremities: Symmetric 5 x 5 power. Skin: No rashes, lesions or ulcers Psychiatry:  Mood & affect appropriate.    Data Reviewed:  I have personally reviewed following labs and imaging studies   CBC Lab Results  Component Value Date   WBC 8.9 05/31/2022   RBC 2.77 (L) 05/31/2022   HGB 7.7 (L) 05/31/2022   HCT 23.8 (L) 05/31/2022   MCV 85.9 05/31/2022   MCH 27.8 05/31/2022   PLT 149 (L) 05/31/2022   MCHC 32.4 05/31/2022   RDW 17.7 (H) 05/31/2022   LYMPHSABS 0.9 05/31/2022   MONOABS 0.7 05/31/2022   EOSABS 0.4 05/31/2022   BASOSABS 0.0 54/00/8676     Last metabolic panel Lab Results  Component Value Date   NA 131 (L) 05/31/2022    K 2.8 (L) 05/31/2022   CL 92 (L) 05/31/2022   CO2 27 05/31/2022   BUN 28 (H) 05/31/2022   CREATININE 5.89 (H) 05/31/2022   GLUCOSE 278 (H) 05/31/2022   GFRNONAA 7 (L) 05/31/2022   GFRAA 4 (L) 11/01/2020   CALCIUM 7.9 (L) 05/31/2022   PHOS 4.9 (H) 04/27/2022   PROT 5.3 (L) 05/31/2022   ALBUMIN 2.4 (L) 05/31/2022   LABGLOB 1.9 03/17/2022   AGRATIO 2.1 03/17/2022   BILITOT 0.9 05/31/2022   ALKPHOS 63 05/31/2022   AST 23 05/31/2022   ALT 30 05/31/2022   ANIONGAP 12 05/31/2022    CBG (last 3)  Recent Labs    05/31/22 0126 05/31/22 0730 05/31/22 1133  GLUCAP 194* 207* 235*      Coagulation Profile: No results for input(s): "INR", "PROTIME" in the last 168 hours.   Radiology Studies: DG Chest 2 View  Result Date: 05/30/2022 CLINICAL DATA:  Chest pain EXAM: CHEST - 2 VIEW COMPARISON:  04/25/2022, 09/15/2020, 03/04/2022 FINDINGS: Cardiomegaly with mild central congestion. Mild diffuse interstitial opacity without significant change. Aortic atherosclerosis. No pneumothorax. Hardware in the cervical spine. IMPRESSION: Cardiomegaly with slight central congestion. Mild diffuse interstitial opacity without focal airspace disease. Electronically Signed   By: Donavan Foil M.D.   On: 05/30/2022 17:48       Hosie Poisson M.D. [facility] 05/31/2022, 1:26 PM  Available via Epic secure chat 7am-7pm After 7 pm, please refer to night coverage provider listed on amion.

## 2022-05-31 NOTE — TOC Initial Note (Signed)
Transition of Care [facility]) - Initial/Assessment Note    Patient Details  Name: [name] MRN: [id number] Date of Birth: 1951-08-12  Transition of Care [facility] CM/SW Contact:    Iona Beard, [place] Phone Number: 05/31/2022, 8:35 AM  Clinical Narrative:                 Pt is high risk for readmission. CSW spoke with pt to complete assessment. Pt states that she lives with her mother. Pt is independent in completing her ADLs and can provide her own transportation when needed. Pt states that [facility] currently comes into her home for services. CSW reached out to [facility] rep with [employer] [employer] to inquire about what services they are active with, awaiting response. Pt states she has a cane and walker to use when needed.   Expected Discharge Plan: [place] Barriers to Discharge: Continued Medical Work up   Patient Goals and CMS Choice Patient states their goals for this hospitalization and ongoing recovery are:: reutrn home with [facility] CMS Medicare.gov Compare Post Acute Care list provided to:: Patient Choice offered to / list presented to : Patient  Expected Discharge Plan and Services Expected Discharge Plan: [place] In-house Referral: Clinical Social Work Discharge Planning Services: CM Consult Post Acute Care Choice: Advance arrangements for the past 2 months: [place]                                      Prior Living Arrangements/Services Living arrangements for the past 2 months: [place] Lives with:: Parents Patient language and need for interpreter reviewed:: Yes Do you feel safe going back to the place where you live?: Yes      Need for Family Participation in Patient Care: No (Comment) Care giver support system in place?: Yes (comment) Current home services: DME, Home PT Criminal Activity/Legal Involvement Pertinent to Current Situation/Hospitalization: No - Comment as needed  Activities  of Daily Living Home Assistive Devices/Equipment: Cane (specify quad or straight) ADL Screening (condition at time of admission) Patient's cognitive ability adequate to safely complete daily activities?: Yes Is the patient deaf or have difficulty hearing?: No Does the patient have difficulty seeing, even when wearing glasses/contacts?: No Does the patient have difficulty concentrating, remembering, or making decisions?: No Patient able to express need for assistance with ADLs?: Yes Does the patient have difficulty dressing or bathing?: No Independently performs ADLs?: Yes (appropriate for developmental age) Does the patient have difficulty walking or climbing stairs?: No Weakness of Legs: None Weakness of Arms/Hands: None  Permission Sought/Granted                  Emotional Assessment Appearance:: Appears stated age Attitude/Demeanor/Rapport: Engaged Affect (typically observed): Accepting Orientation: : Oriented to Self, Oriented to Place, Oriented to  Time, Oriented to Situation Alcohol / Substance Use: Not Applicable Psych Involvement: No (comment)  Admission diagnosis:  Paroxysmal atrial fibrillation ([place]) [I48.0] Symptomatic anemia [D64.9] Anemia, unspecified type [D64.9] Patient Active Problem List   Diagnosis Date Noted   Symptomatic anemia 05/30/2022   GI bleed 05/30/2022   Acute respiratory failure with hypoxia (HCC) 04/26/2022   Sepsis ([place]) 04/26/2022   PAF (paroxysmal atrial fibrillation) ([place]) 04/26/2022   Multifocal pneumonia 04/25/2022   Dyslipidemia 04/04/2022   Acute exacerbation of Diastolic CHF (congestive heart failure) ([place]) 03/26/2022   Physical deconditioning 03/09/2022  Hyponatremia 03/09/2022   Pulmonary hypertension ([place]) 03/06/2022   Hypokalemia 03/05/2022   Atrial fibrillation with RVR ([place]) 03/04/2022   Change in bowel habit 12/16/2021   Colon cancer screening 12/16/2021   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   Family history of  colonic polyps 12/16/2021   Acute CHF (congestive heart failure) ([place]) 12/06/2021   Leukocytosis 12/06/2021   Elevated brain natriuretic peptide (BNP) level 12/06/2021   Elevated troponin 12/06/2021   GERD (gastroesophageal reflux disease) 12/06/2021   Nausea 08/26/2021   Lumbago of lumbar region with sciatica 07/21/2021   Arthropathy of lumbar facet joint 06/27/2021   Radiculopathy, lumbar region 05/05/2021   Chest pain 02/19/2021   Essential hypertension    Mixed hyperlipidemia    Type 2 diabetes mellitus with hyperglycemia ([place])    Hypoalbuminemia    [place] 01/05/2020   Educated about COVID-19 virus infection 01/05/2020   End stage renal disease on dialysis due to type 2 diabetes mellitus ([place]) 07/21/2019   Lung nodule 05/01/2018   Prolonged Q-T interval on ECG 09/06/2017   Unstable angina (HCC) 09/06/2017   GAD (generalized anxiety disorder) 03/04/2016   Leg pain 01/07/2016   Anemia of chronic disease/ESRD 12/10/2015   Dependence on renal dialysis ([place]) 12/10/2015   ESRD (end stage renal disease) on dialysis ([place]) 12/10/2015   Osteopathy in diseases classified elsewhere, unspecified site 12/10/2015   Constipation 10/25/2015   COPD (chronic obstructive pulmonary disease) ([place]) 03/06/2015   Pericardial effusion 03/06/2015   Chronic diastolic CHF (congestive heart failure) ([place]) 03/05/2015   PAD (peripheral artery disease) ([place]) 04/21/2014   Dyspnea 09/29/2013   Obesity (BMI 30.0-34.9) 12/05/2011   Diverticulosis 03/29/2011   Diabetic neuropathy ([place]) 03/29/2011   Esophageal stricture 03/29/2011   Vitamin D deficiency 03/29/2011   Degenerative joint disease of left shoulder 03/29/2011   Neck pain 03/29/2011   Type II diabetes mellitus with nephropathy ([place]) 04/19/2009   Hyperlipidemia associated with type 2 diabetes mellitus ([place]) 04/19/2009   Hypertension associated with diabetes ([place]) 04/19/2009   Coronary artery disease of native artery of native heart with stable angina  pectoris ([place]) 04/19/2009   PCP:  [name], MD Pharmacy:   [facility] [address] [place] [address] [place] [address] Phone: [phone] Fax: [phone]     Social Determinants of Health (SDOH) Interventions    Readmission Risk Interventions    05/31/2022    8:33 AM 04/28/2022    2:04 PM 04/26/2022    8:27 AM  Readmission Risk Prevention Plan  Transportation Screening Complete Complete Complete  Medication Review (RN Care Manager) Complete Complete Complete  PCP or Specialist appointment within 3-5 days of discharge  Complete   HRI or [place] Complete Complete Complete  SW Recovery Care/Counseling Consult   Complete  Palliative Care Screening Not Applicable  Not [place] Not Applicable  Not Applicable

## 2022-05-31 NOTE — Progress Notes (Signed)
Patient refused Phoslo and potassium PO , notified Dr. Karleen Hampshire , new order for potassium IV,

## 2022-05-31 NOTE — Progress Notes (Addendum)
Notified MD of patient potassium of 2.8 from earlier today.  Notified MD of patient refusal for oral potassium supplementation. Patient stated "it tasted bad and she could not swallow it." Received order for 2 runs of potassium.

## 2022-05-31 NOTE — H&P (Signed)
History and Physical    Patient: [name] XNA:[id number] DOB: [birth year] DOA: 05/30/2022 DOS: the patient was seen and examined on 05/31/2022 PCP: [name], MD  Patient coming from: Home  Chief Complaint:  Chief Complaint  Patient presents with   Chest Pain   HPI: [name] is a 71 y.o. female with medical history significant of CHF with grade 2 diastolic dysfunction, anemia, coronary artery disease, end-stage renal disease with dialysis Tuesday Thursday Saturday, COPD, diabetic neuropathy, GERD, hyperlipidemia, hypertension, insulin-dependent diabetes, stroke, peripheral vascular disease, and more presents ED with a chief complaint of chest pain.  Patient reports that she had a pressure-like chest pain in the center of her chest.  She reports she has associated palpitations.  This started 4 days ago.  The pain and palpitations have been constant.  They are not necessarily exertional.  Patient reports sometimes it is worse when she is standing and when she is lying down.  She has had peripheral edema in her lower extremities since April per her report.  She was taking Lasix pill, but at her last admission she was told to discontinue it so she has not been taking it.  Patient reports to chronic nausea.  She had dyspnea, and does not have O2 at home.  She is on 2 L O2 nasal cannula at the time of my exam.  She has no cough.  Patient has no other complaints at this time. Patient does not smoke, does not drink, does not use illicit drugs.  She is vaccinated for COVID.  Patient is full code. Review of Systems: As mentioned in the history of present illness. All other systems reviewed and are negative. Past Medical History:  Diagnosis Date   Acute on chronic diastolic CHF (congestive heart failure) ([place]) 03/05/2015   Grade 2. EF 60-65%   Anemia    hx of   Anxiety    Arthritis    CAD (coronary artery disease)    s/p Stenting in 2005;  [place] 7/09: Vigorous LV function, 2-3+ MR, RI 40%,  RI stent patent, proximal-mid RCA 40-50%;  Echo 7/09:  EF 55-65%, trivial MR;  Myoview 11/18/12: EF 66%, normal LV wall motion, mild to moderate anterior ischemia - reviewed by [facility] and felt to rep breast atten (low risk);  Echo 6/14: mild LVH, EF 55-65%, Gr 2 DD, PASP 44, trivial eff    Chronic kidney disease    CREATININE IS UP--GOING TO KIDNEY MD    Chronic neck pain    COPD (chronic obstructive pulmonary disease) (HCC)    Degenerative joint disease    left shoulder   Diabetic neuropathy (HCC)    Diverticulosis    Esophageal stricture    GERD (gastroesophageal reflux disease)    Hyperlipidemia    Hypertension    IDDM (insulin dependent diabetes mellitus)    Pericardial effusion 03/06/2015   Very small per echo ; pleural nodules seen on CT chest.   Peripheral vascular disease ([place])    Stroke ([place])    "mini- years ago"   Vitamin D deficiency    Past Surgical History:  Procedure Laterality Date   A/V FISTULAGRAM N/A 08/10/2017   Procedure: A/V Fistulagram - Right Arm;  Surgeon: Elam Dutch, MD;  Location: [place] [facility];  Service: Cardiovascular;  Laterality: N/A;   A/V FISTULAGRAM N/A 03/03/2020   Procedure: A/V FISTULAGRAM - Right Arm;  Surgeon: Marty Heck, MD;  Location: [place] [facility];  Service: Cardiovascular;  Laterality:  N/A;   ABDOMINAL HYSTERECTOMY     AV FISTULA PLACEMENT Right 07/20/2015   Procedure: RADIAL CEPHALIC ARTERIOVENOUS FISTULA CREATION RIGHT ARM;  Surgeon: Angelia Mould, MD;  Location: [place];  Service: Vascular;  Laterality: Right;   AV FISTULA PLACEMENT Right 11/26/2015   Procedure:  Creation of RIGHT BRACHIO-CEPHALIC arteriovenous fistula;  Surgeon: Angelia Mould, MD;  Location: [facility];  Service: Vascular;  Laterality: Right;   BACK SURGERY     2007 & 2013   [place]  2003, 2005, 2009   1 stent   cardiac stents     CARDIOVERSION N/A 04/24/2022   Procedure: CARDIOVERSION;  Surgeon:  Fay Records, MD;  Location: [facility];  Service: Cardiovascular;  Laterality: N/A;   CATARACT EXTRACTION W/PHACO Right 05/04/2014   Procedure: CATARACT EXTRACTION PHACO AND INTRAOCULAR LENS PLACEMENT ([place]);  Surgeon: Williams Che, MD;  Location: [facility];  Service: Ophthalmology;  Laterality: Right;  CDE 1.47   CATARACT EXTRACTION W/PHACO Left 07/20/2014   Procedure: CATARACT EXTRACTION WITH PHACO AND INTRAOCULAR LENS PLACEMENT LEFT EYE CDE=1.79;  Surgeon: Williams Che, MD;  Location: [facility];  Service: Ophthalmology;  Laterality: Left;   CERVICAL SPINE SURGERY  2012   8 screws   CORONARY ATHERECTOMY N/A 03/08/2022   Procedure: CORONARY ATHERECTOMY;  Surgeon: Sherren Mocha, MD;  Location: [place] [facility];  Service: Cardiovascular;  Laterality: N/A;   CYSTOSCOPY WITH INJECTION N/A 06/03/2013   Procedure: MACROPLASTIQUE  INJECTION ;  Surgeon: Malka So, MD;  Location: [facility];  Service: Urology;  Laterality: N/A;   FISTULOGRAM Right 10/20/2016   Procedure: FISTULOGRAM WITH POSSIBLE INTERVENTION;  Surgeon: [name], MD;  Location: [facility];  Service: Vascular;  Laterality: Right;   INSERTION OF DIALYSIS CATHETER N/A 11/26/2015   Procedure: INSERTION OF DIALYSIS CATHETER;  Surgeon: Angelia Mould, MD;  Location: [place];  Service: Vascular;  Laterality: N/A;   INTRAVASCULAR IMAGING/OCT N/A 03/08/2022   Procedure: INTRAVASCULAR IMAGING/OCT;  Surgeon: Sherren Mocha, MD;  Location: [place] [facility];  Service: Cardiovascular;  Laterality: N/A;   LAMINECTOMY  12/29/2011   LEFT HEART CATH AND CORONARY ANGIOGRAPHY N/A 03/06/2022   Procedure: LEFT HEART CATH AND CORONARY ANGIOGRAPHY;  Surgeon: Nelva Bush, MD;  Location: [place] [facility];  Service: Cardiovascular;  Laterality: N/A;   LUMBAR LAMINECTOMY/DECOMPRESSION MICRODISCECTOMY  12/29/2011   Procedure: LUMBAR LAMINECTOMY/DECOMPRESSION MICRODISCECTOMY;  Surgeon: Floyce Stakes, MD;  Location: [place] [facility];  Service:  Neurosurgery;  Laterality: N/A;  Lumbar Two through Lumbar Five Laminectomies/Cellsaver   PARTIAL HYSTERECTOMY     PERIPHERAL VASCULAR BALLOON ANGIOPLASTY  03/03/2020   Procedure: PERIPHERAL VASCULAR BALLOON ANGIOPLASTY;  Surgeon: Marty Heck, MD;  Location: [place] [facility];  Service: Cardiovascular;;  Rt arm fistula   PERIPHERAL VASCULAR BALLOON ANGIOPLASTY Right 04/15/2021   Procedure: PERIPHERAL VASCULAR BALLOON ANGIOPLASTY;  Surgeon: Angelia Mould, MD;  Location: [place] [facility];  Service: Cardiovascular;  Laterality: Right;  arm fistula   PERIPHERAL VASCULAR BALLOON ANGIOPLASTY  03/14/2022   Procedure: PERIPHERAL VASCULAR BALLOON ANGIOPLASTY;  Surgeon: Broadus John, MD;  Location: [place] [facility];  Service: Cardiovascular;;  right arm AVF   PERIPHERAL VASCULAR CATHETERIZATION N/A 11/22/2015   Procedure: Fistulagram;  Surgeon: Angelia Mould, MD;  Location: [place] [facility];  Service: Cardiovascular;  Laterality: N/A;   PERIPHERAL VASCULAR INTERVENTION  08/10/2017   Procedure: PERIPHERAL VASCULAR INTERVENTION;  Surgeon:  Elam Dutch, MD;  Location[facility] [place] [facility];  Service: Cardiovascular;;   SHOULDER SURGERY Right    Rotator cuff   Social History:  reports that she quit smoking about 20 years ago. Her smoking use included cigarettes. She has a 30.00 pack-year smoking history. She has never been exposed to tobacco smoke. She has never used smokeless tobacco. She reports that she does not drink alcohol and does not use drugs.  Allergies  Allergen Reactions   Azithromycin     Prolonged QT   Ace Inhibitors Cough   Clopidogrel Bisulfate Other (See Comments)    Sick, Headache, "Felt terrible"   Codeine Other (See Comments)    hallucinations   Lisinopril Cough   Penicillins Other (See Comments)    Unknown childhood reaction Did it involve swelling of the face/tongue/throat, SOB, or low BP? Unknown Did it involve sudden or severe rash/hives, skin  peeling, or any reaction on the inside of your mouth or nose? Unknown Did you need to seek medical attention at a hospital or doctor's office? Unknown When did it last happen?      childhood allergy If all above answers are "NO", may proceed with cephalosporin use.      Family History  Problem Relation Age of Onset   Heart disease Other    Hypertension Father    Cancer Father    Hypertension Mother    Hypertension Daughter    Pancreatitis Brother    Hypertension Daughter    Diabetes Daughter     Prior to Admission medications   Medication Sig Start Date End Date Taking? Authorizing Provider  acetaminophen (TYLENOL) 500 MG tablet Take 1,000 mg by mouth every 6 (six) hours as needed for moderate pain or headache.   Yes [provider]  albuterol (PROVENTIL) (2.5 MG/3ML) 0.083% nebulizer solution Take 3 mLs (2.5 mg total) by nebulization every 6 (six) hours as needed for wheezing or shortness of breath. 12/02/21  Yes [name], MD  apixaban (ELIQUIS) 5 MG TABS tablet Take 1 tablet (5 mg total) by mouth 2 (two) times daily. 04/05/22  Yes Minus Breeding, MD  aspirin EC 81 MG tablet Take 1 tablet (81 mg total) by mouth daily. Swallow whole. 05/17/22  Yes Minus Breeding, MD  atenolol (TENORMIN) 100 MG tablet Take 1 tablet (100 mg total) by mouth at bedtime. 05/10/22  Yes [name], [name], MD  budesonide-formoterol (SYMBICORT) 160-4.5 MCG/ACT inhaler Inhale 2 puffs into the lungs 2 (two) times daily. 05/03/22  Yes [name], MD  calcium acetate (PHOSLO) 667 MG capsule Take 667-1,334 mg by mouth See admin instructions. Take 1334 mg with meals 3 times daily and 667 mg with snacks   Yes [provider]  Carboxymethylcellul-Glycerin (LUBRICATING EYE DROPS OP) Place 1 drop into both eyes daily as needed (dry eyes).   Yes [provider]  cetirizine (ZYRTEC) 10 MG tablet Take 1 tablet (10 mg total) by mouth daily as needed for allergies. Patient taking differently:  Take 10 mg by mouth daily. 02/08/22  Yes [name], MD  diltiazem (CARDIZEM CD) 240 MG 24 hr capsule Take 1 capsule (240 mg total) by mouth daily. 05/17/22  Yes Minus Breeding, MD  gabapentin (NEURONTIN) 400 MG capsule Take 2 capsules (800 mg total) by mouth See admin instructions. Take it after HD Patient taking differently: Take 800 mg by mouth See admin instructions. Take after dialysis 03/22/22  Yes [name], [name], MD  hydrOXYzine (ATARAX) 50 MG tablet Take 1 tablet (50 mg  total) by mouth 3 (three) times daily as needed for anxiety. 03/22/22  Yes [name], [name], MD  insulin aspart (NOVOLOG FLEXPEN) 100 UNIT/ML FlexPen Inject 1 Units into the skin 3 (three) times daily with meals. < 150: 0 units. 150-199:1 unit. 200-249: 2 units. 250-299: 3 units. 300-349: 4 units. Over 350: 5 units 05/19/22  Yes Rakes, Connye Burkitt, FNP  insulin degludec (TRESIBA FLEXTOUCH) 100 UNIT/ML FlexTouch Pen Inject 20 Units into the skin at bedtime. Via novo nordisk patient assistance program Patient taking differently: Inject 30 Units into the skin at bedtime. Via novo nordisk patient assistance program 05/03/22  Yes [name], MD  lidocaine-prilocaine (EMLA) cream Apply 1 application. topically as needed (dialysis).   Yes [provider]  multivitamin (RENA-VIT) TABS tablet Take 1 tablet by mouth daily. 05/13/22  Yes [provider]  nitroGLYCERIN (NITROSTAT) 0.4 MG SL tablet Place 1 tablet (0.4 mg total) under the tongue every 5 (five) minutes as needed for chest pain. 11/09/21  Yes Minus Breeding, MD  omeprazole (PRILOSEC) 20 MG capsule Take 1 capsule (20 mg total) by mouth daily. On an empty stomach 05/10/22  Yes [name], [name], MD  ondansetron (ZOFRAN) 4 MG tablet TAKE 1 TABLET BY MOUTH EVERY 8 HOURS AS NEEDED FOR NAUSEA AND VOMITING Patient taking differently: Take 4 mg by mouth every 8 (eight) hours as needed for nausea or vomiting. 05/19/22  Yes Rakes, Connye Burkitt, FNP  polyethylene glycol (MIRALAX /  GLYCOLAX) 17 g packet Take 17 g by mouth daily as needed for moderate constipation or mild constipation. 03/10/22  Yes Pokhrel, [place], MD  QUEtiapine (SEROQUEL) 200 MG tablet Take 1 tablet (200 mg total) by mouth at bedtime. For sleep and for anxiety 03/17/22  Yes [name], [name], MD  rOPINIRole (REQUIP) 1 MG tablet Take 1 tablet (1 mg total) by mouth at bedtime. For leg cramps 09/15/21  Yes [name], [name], MD  rosuvastatin (CRESTOR) 5 MG tablet Take 1 tablet (5 mg total) by mouth daily. For cholesterol Patient taking differently: Take 5 mg by mouth at bedtime. For cholesterol 12/12/21  Yes [name], [name], MD  Semaglutide,0.25 or 0.5MG /DOS, (OZEMPIC, 0.25 OR 0.5 MG/DOSE,) 2 MG/1.5ML SOPN Inject 0.5 mg into the skin every Friday.   Yes [provider]  albuterol (VENTOLIN HFA) 108 (90 Base) MCG/ACT inhaler Inhale 2 puffs into the lungs every 6 (six) hours as needed for wheezing or shortness of breath. 12/02/21   [name], MD  Continuous Blood Gluc Receiver (FREESTYLE LIBRE 2 READER) DEVI USE TO TEST BLOOD SUGAR CONTINUOUSLY 11/15/21   [name], MD  Continuous Blood Gluc Sensor (FREESTYLE LIBRE 2 SENSOR) MISC Use multiple times daily to track glucose to prevent highs and lows as a complication of dialysis Dx:Z99.2, E11.59 09/15/21   [name], MD  dextromethorphan-guaiFENesin (MUCINEX DM) 30-600 MG 12hr tablet Take 1 tablet by mouth 2 (two) times daily. Patient not taking: Reported on 05/30/2022 04/28/22   Barton Dubois, MD  glucose blood (ONETOUCH ULTRA) test strip Use to test blood sugar 3 times daily as directed. DX: E11.9 09/13/20   [name], MD  predniSONE (DELTASONE) 10 MG tablet Take 5 daily for 3 days followed by 4,3,2 and 1 for 3 days each. Patient not taking: Reported on 05/30/2022 05/10/22   [name], MD    Physical Exam: Vitals:   05/31/22 0000 05/31/22 0045 05/31/22 0100 05/31/22 0256  BP: (!) 140/55 (!) 147/53 (!) 139/49 134/62  Pulse: 97 83 99 90  Resp:  10 20  18 18  Temp: 98 F (36.7 C) 98 F (36.7 C) 98.6 F (37 C) 98.5 F (36.9 C)  TempSrc: Oral Oral Oral Oral  SpO2: 100% 99% 99%   Weight: 80 kg     Height: 5\' 3"  (1.6 m)      1.  General: Patient lying supine in bed,  no acute distress   2. Psychiatric: Alert and oriented x 3, mood and behavior normal for situation, pleasant and cooperative with exam   3. Neurologic: Speech and language are normal, face is symmetric, moves all 4 extremities voluntarily, at baseline without acute deficits on limited exam   4. HEENMT:  Head is atraumatic, normocephalic, hirsutism present, pupils reactive to light, neck is supple, trachea is midline, mucous membranes are moist   5. Respiratory : Lungs are clear to auscultation bilaterally without wheezing, rhonchi, rales, no cyanosis, no increase in work of breathing or accessory muscle use   6. Cardiovascular : Heart rate normal, rhythm is irregularly irregular, no murmurs, rubs or gallops, trace peripheral edema, peripheral pulses palpated   7. Gastrointestinal:  Abdomen is soft, nondistended, nontender to palpation bowel sounds active, no masses or organomegaly palpated   8. Skin:  Skin is warm, dry and intact without rashes, acute lesions, or ulcers on limited exam   9.Musculoskeletal:  No acute deformities or trauma, no asymmetry in tone, trace peripheral edema, peripheral pulses palpated, no tenderness to palpation in the extremities  Data Reviewed: In the ED Temp 98.1, heart rate 91-98, respiratory rate 17-21, blood pressure 93/65-127/69 Slight leukocytosis 11.7, hemoglobin 7.6, platelets 178 Hypokalemia at 3.4 BUN 22 creatinine 5.01 Hyperglycemia 233 Troponin flat 89, 92 Positive fecal occult blood test Chest x-ray shows cardiomegaly with slight central congestion.  Mild diffuse interstitial opacity with focal airspace disease EKG shows A-fib, heart rate 99, old septal infarct Cardiology was consulted and recommended not  cardioverting the patient as they believe her symptoms are more likely secondary to the anemia Admit for symptomatic anemia and GI bleed work-up  Assessment and Plan: * Symptomatic anemia - Secondary to GI bleed -Hemoglobin baseline around 10, 9 at the end of May, now 7.6 -1 unit transfusion in the ED -FOBT positive -Consult GI -Hold Eliquis and aspirin -Hold pharmacologic DVT prophylaxis -Trend at midnight and again with a.m. labs -Continue to monitor  End stage renal disease on dialysis due to type 2 diabetes mellitus ([place]) - Consult nephrology for routine dialysis -Potassium 3.4, bicarb 30, BUN 22, creatinine 5.01 -labs are at baseline -Continue PhosLo -Renal diet with fluid restrictions especially in the setting of blood transfusion -Continue to monitor  Type II diabetes mellitus with nephropathy (HCC) - 30 units basal insulin at baseline -Continue with 15 units basal insulin and sliding scale coverage -Renal diet -Last hemoglobin A1c 8.0 -Monitor CBGs  Hypertension associated with diabetes ([place]) - Continue atenolol, diltiazem  GI bleed - Hemoglobin dropped from 10--> 7.6 -1 unit transfusion -Trend at midnight and again a.m. -Consult GI -Continue IV Protonix twice daily -Continue to monitor -See plan for symptomatic anemia  PAF (paroxysmal atrial fibrillation) (HCC) - Currently in A-fib -rate controlled in the 80s -Continue atenolol, diltiazem -Holding Eliquis -Cardiology consulted and does not recommend cardioversion at this time as her symptoms are likely secondary to anemia rather than A-fib   Hypokalemia - Potassium 3.4 in the setting of ESRD on hemodialysis -Defer to nephro  Elevated troponin - Troponin flat 89, 92-in the setting of ESRD -EKG is without acute ischemic changes but  does show old septal infarct -We will recheck EKG/troponin if patient has chest pain -Currently chest pain-free -Continue to monitor on telemetry  Mixed hyperlipidemia -  Continue Crestor  GAD (generalized anxiety disorder) - Continue Atarax  Constipation - Holding MiraLAX in the setting of possible GI bleed -We will resume after GI clears -Continue to monitor  COPD (chronic obstructive pulmonary disease) (HCC) - Continue Symbicort and as needed albuterol  PAD (peripheral artery disease) (HCC) - Continue Crestor -Holding aspirin in the setting of GI bleed      Advance Care Planning:   Code Status: Full Code   Consults: GI, nephro  Family Communication: No family at bedside  Severity of Illness: The appropriate patient status for this patient is INPATIENT. Inpatient status is judged to be reasonable and necessary in order to provide the required intensity of service to ensure the patient's safety. [name] presenting symptoms, physical exam findings, and initial radiographic and laboratory data in the context of their chronic comorbidities is felt to place them at high risk for further clinical deterioration. Furthermore, it is not anticipated that the patient will be medically stable for discharge from the hospital within 2 midnights of admission.   * I certify that at the point of admission it is my clinical judgment that the patient will require inpatient hospital care spanning beyond 2 midnights from the point of admission due to high intensity of service, high risk for further deterioration and high frequency of surveillance required.*  Author: Rolla Plate, DO 05/31/2022 4:13 AM  For on call review [email or web address].

## 2022-05-31 NOTE — Plan of Care (Signed)
  Problem: Acute Rehab PT Goals(only PT should resolve) Goal: Pt Will Go Supine/Side To Sit Outcome: Progressing Flowsheets (Taken 05/31/2022 1510) Pt will go Supine/Side to Sit:  Independently  with modified independence Goal: Patient Will Transfer Sit To/From Stand Outcome: Progressing Flowsheets (Taken 05/31/2022 1510) Patient will transfer sit to/from stand:  Independently  with modified independence Goal: Pt Will Transfer Bed To Chair/Chair To Bed Outcome: Progressing Flowsheets (Taken 05/31/2022 1510) Pt will Transfer Bed to Chair/Chair to Bed: with modified independence Goal: Pt Will Ambulate Outcome: Progressing Flowsheets (Taken 05/31/2022 1510) Pt will Ambulate:  75 feet  with modified independence  with least restrictive assistive device   3:10 PM, 05/31/22 Lonell Grandchild, MPT Physical Therapist with [facility] [phone] office [phone] mobile phone

## 2022-05-31 NOTE — Evaluation (Signed)
Physical Therapy Evaluation Patient Details Name: EMORIE MCFATE MRN: [id number] DOB: May 15, 1951 Today's Date: 05/31/2022  History of Present Illness  NELLE SAYED is a 71 y.o. female with medical history significant of CHF with grade 2 diastolic dysfunction, anemia, coronary artery disease, end-stage renal disease with dialysis Tuesday Thursday Saturday, COPD, diabetic neuropathy, GERD, hyperlipidemia, hypertension, insulin-dependent diabetes, stroke, peripheral vascular disease, and more presents ED with a chief complaint of chest pain.  Patient reports that she had a pressure-like chest pain in the center of her chest.  She reports she has associated palpitations.  This started 4 days ago.  The pain and palpitations have been constant.  They are not necessarily exertional.  Patient reports sometimes it is worse when she is standing and when she is lying down.  She has had peripheral edema in her lower extremities since April per her report.  She was taking Lasix pill, but at her last admission she was told to discontinue it so she has not been taking it.  Patient reports to chronic nausea.  She had dyspnea, and does not have O2 at home.  She is on 2 L O2 nasal cannula at the time of my exam.  She has no cough.  Patient has no other complaints at this time.  Patient does not smoke, does not drink, does not use illicit drugs.  She is vaccinated for COVID.  Patient is full code.   Clinical Impression  Patient functioning near baseline for functional mobility and gait demonstrating good return for bed mobility, transfers and ambulating in room without loss of balance.  Patient limited mostly due to fatigue and s/p receiving blood transfusion.  Patient tolerated sitting up in chair after therapy.  Patient will benefit from continued skilled physical therapy in hospital and recommended venue below to increase strength, balance, endurance for safe ADLs and gait.          Recommendations for follow up  therapy are one component of a multi-disciplinary discharge planning process, led by the attending physician.  Recommendations may be updated based on patient status, additional functional criteria and insurance authorization.  Follow Up Recommendations Home health PT      Assistance Recommended at Discharge PRN  Patient can return home with the following  A little help with walking and/or transfers;A little help with bathing/dressing/bathroom;Assistance with cooking/housework;Help with stairs or ramp for entrance    Equipment Recommendations None recommended by PT  Recommendations for Other Services       Functional Status Assessment Patient has had a recent decline in their functional status and demonstrates the ability to make significant improvements in function in a reasonable and predictable amount of time.     Precautions / Restrictions Precautions Precautions: Fall Restrictions Weight Bearing Restrictions: No      Mobility  Bed Mobility Overal bed mobility: Modified Independent                  Transfers Overall transfer level: Modified independent                      Ambulation/Gait Ambulation/Gait assistance: Supervision Gait Distance (Feet): 20 Feet Assistive device: None, 1 person hand held assist Gait Pattern/deviations: Decreased step length - right, Decreased step length - left, Decreased stride length Gait velocity: decreased     General Gait Details: slightly labored cadence without loss of balance, limited mostly due to c/o fatigue  Stairs  Wheelchair Mobility    Modified Rankin (Stroke Patients Only)       Balance Overall balance assessment: Mild deficits observed, not formally tested                                           Pertinent Vitals/Pain Pain Assessment Pain Assessment: No/denies pain    Home Living Family/patient expects to be discharged to:: Private residence Living  Arrangements: Parent Available Help at Discharge: Family;Available PRN/intermittently Type of Home: House Home Access: Stairs to enter Entrance Stairs-Rails: Right;Left Entrance Stairs-Number of Steps: 2   Home Layout: One level Home Equipment: Clinical biochemist (2 wheels);Cane - single point      Prior Function Prior Level of Function : Independent/Modified Independent             Mobility Comments: Independent, drives to dialysis, does not cook (66 y/o mother does), Hydrographic surveyor using SPC PRN ADLs Comments: Independent     Hand Dominance   Dominant Hand: Right    Extremity/Trunk Assessment   Upper Extremity Assessment Upper Extremity Assessment: Defer to OT evaluation    Lower Extremity Assessment Lower Extremity Assessment: Generalized weakness    Cervical / Trunk Assessment Cervical / Trunk Assessment: Normal  Communication   Communication: No difficulties  Cognition Arousal/Alertness: Awake/alert Behavior During Therapy: WFL for tasks assessed/performed Overall Cognitive Status: Within Functional Limits for tasks assessed                                          General Comments      Exercises     Assessment/Plan    PT Assessment Patient needs continued PT services  PT Problem List Decreased strength;Decreased activity tolerance;Decreased balance;Decreased mobility       PT Treatment Interventions DME instruction;Gait training;Stair training;Functional mobility training;Therapeutic activities;Therapeutic exercise;Balance training;Patient/family education    PT Goals (Current goals can be found in the Care Plan section)  Acute Rehab PT Goals Patient Stated Goal: return home with family to assist PT Goal Formulation: With patient Time For Goal Achievement: 06/02/22 Potential to Achieve Goals: Good    Frequency Min 2X/week     Co-evaluation               AM-PAC PT "6 Clicks" Mobility   Outcome Measure Help needed turning from your back to your side while in a flat bed without using bedrails?: None Help needed moving from lying on your back to sitting on the side of a flat bed without using bedrails?: None Help needed moving to and from a bed to a chair (including a wheelchair)?: A Little Help needed standing up from a chair using your arms (e.g., wheelchair or bedside chair)?: None Help needed to walk in hospital room?: A Little Help needed climbing 3-5 steps with a railing? : A Little 6 Click Score: 21    End of Session   Activity Tolerance: Patient tolerated treatment well;Patient limited by fatigue Patient left: in chair;with call bell/phone within reach Nurse Communication: Mobility status PT Visit Diagnosis: Unsteadiness on feet (R26.81);Other abnormalities of gait and mobility (R26.89);Muscle weakness (generalized) (M62.81)    Time: 3976-7341 PT Time Calculation (min) (ACUTE ONLY): 22 min   Charges:   PT Evaluation $PT Eval Moderate Complexity: 1 Mod PT Treatments $Therapeutic Activity: 8-22 mins  3:08 PM, 05/31/22 Lonell Grandchild, MPT Physical Therapist with [facility] [phone] office [phone] mobile phone

## 2022-06-01 ENCOUNTER — Inpatient Hospital Stay (HOSPITAL_COMMUNITY): Payer: Medicare Other

## 2022-06-01 ENCOUNTER — Inpatient Hospital Stay (HOSPITAL_COMMUNITY): Payer: Medicare Other | Admitting: Certified Registered Nurse Anesthetist

## 2022-06-01 ENCOUNTER — Encounter (HOSPITAL_COMMUNITY)
Admission: EM | Disposition: A | Discharge status: Home / Self Care | Payer: Self-pay | Source: Home / Self Care | Attending: Internal Medicine

## 2022-06-01 DIAGNOSIS — E1122 Type 2 diabetes mellitus with diabetic chronic kidney disease: Secondary | ICD-10-CM | POA: Diagnosis not present

## 2022-06-01 DIAGNOSIS — I4819 Other persistent atrial fibrillation: Secondary | ICD-10-CM | POA: Diagnosis not present

## 2022-06-01 DIAGNOSIS — E1159 Type 2 diabetes mellitus with other circulatory complications: Secondary | ICD-10-CM | POA: Diagnosis not present

## 2022-06-01 DIAGNOSIS — D649 Anemia, unspecified: Secondary | ICD-10-CM | POA: Diagnosis not present

## 2022-06-01 LAB — CBC
HCT: 28.3 % — ABNORMAL LOW (ref 36.0–46.0)
Hemoglobin: 9.3 g/dL — ABNORMAL LOW (ref 12.0–15.0)
MCH: 28.3 pg (ref 26.0–34.0)
MCHC: 32.9 g/dL (ref 30.0–36.0)
MCV: 86 fL (ref 80.0–100.0)
Platelets: 156 10*3/uL (ref 150–400)
RBC: 3.29 MIL/uL — ABNORMAL LOW (ref 3.87–5.11)
RDW: 16.8 % — ABNORMAL HIGH (ref 11.5–15.5)
WBC: 11.1 10*3/uL — ABNORMAL HIGH (ref 4.0–10.5)
nRBC: 0 % (ref 0.0–0.2)

## 2022-06-01 LAB — TYPE AND SCREEN
ABO/RH(D): B NEG
Antibody Screen: NEGATIVE
Unit division: 0
Unit division: 0

## 2022-06-01 LAB — BPAM RBC
Blood Product Expiration Date: 202307112359
Blood Product Expiration Date: 202307132359
ISSUE DATE / TIME: 202306280039
ISSUE DATE / TIME: 202306281027
Unit Type and Rh: 1700
Unit Type and Rh: 1700

## 2022-06-01 LAB — RENAL FUNCTION PANEL
Albumin: 2.7 g/dL — ABNORMAL LOW (ref 3.5–5.0)
Anion gap: 13 (ref 5–15)
BUN: 21 mg/dL (ref 8–23)
CO2: 28 mmol/L (ref 22–32)
Calcium: 8.7 mg/dL — ABNORMAL LOW (ref 8.9–10.3)
Chloride: 93 mmol/L — ABNORMAL LOW (ref 98–111)
Creatinine, Ser: 4.62 mg/dL — ABNORMAL HIGH (ref 0.44–1.00)
GFR, Estimated: 10 mL/min — ABNORMAL LOW (ref >60)
Glucose, Bld: 268 mg/dL — ABNORMAL HIGH (ref 70–99)
Phosphorus: 3.4 mg/dL (ref 2.5–4.6)
Potassium: 4 mmol/L (ref 3.5–5.1)
Sodium: 134 mmol/L — ABNORMAL LOW (ref 135–145)

## 2022-06-01 LAB — GLUCOSE, CAPILLARY
Glucose-Capillary: 147 mg/dL — ABNORMAL HIGH (ref 70–99)
Glucose-Capillary: 176 mg/dL — ABNORMAL HIGH (ref 70–99)
Glucose-Capillary: 265 mg/dL — ABNORMAL HIGH (ref 70–99)

## 2022-06-01 LAB — HEPATITIS C ANTIBODY: HCV Ab: NONREACTIVE

## 2022-06-01 LAB — HEPATITIS B SURFACE ANTIBODY,QUALITATIVE: Hep B S Ab: REACTIVE — AB

## 2022-06-01 LAB — HEPATITIS B CORE ANTIBODY, TOTAL: Hep B Core Total Ab: NONREACTIVE

## 2022-06-01 LAB — HEPATITIS B SURFACE ANTIGEN: Hepatitis B Surface Ag: NONREACTIVE

## 2022-06-01 SURGERY — ESOPHAGOGASTRODUODENOSCOPY (EGD) WITH PROPOFOL
Anesthesia: Monitor Anesthesia Care

## 2022-06-01 MED ORDER — DARBEPOETIN ALFA 150 MCG/0.3ML IJ SOSY: 150.0000 ug | PREFILLED_SYRINGE | INTRAMUSCULAR | Status: DC

## 2022-06-01 MED ORDER — CHLORHEXIDINE GLUCONATE CLOTH 2 % EX PADS
6.0000 | MEDICATED_PAD | Freq: Every day | CUTANEOUS | Status: DC
  Administered 2022-06-01 – 2022-06-02 (×2): 6 via TOPICAL

## 2022-06-01 MED ORDER — PENTAFLUOROPROP-TETRAFLUOROETH EX AERO: 1.0000 | INHALATION_SPRAY | CUTANEOUS | Status: DC | PRN

## 2022-06-01 MED ORDER — DARBEPOETIN ALFA 150 MCG/0.3ML IJ SOSY
PREFILLED_SYRINGE | INTRAMUSCULAR | Status: AC
  Administered 2022-06-01: 150 ug via INTRAVENOUS
  Filled 2022-06-01: qty 0.3

## 2022-06-01 MED ORDER — LIDOCAINE HCL (PF) 1 % IJ SOLN: 5.0000 mL | INTRAMUSCULAR | Status: DC | PRN

## 2022-06-01 MED ORDER — LIDOCAINE-PRILOCAINE 2.5-2.5 % EX CREA: 1.0000 | TOPICAL_CREAM | CUTANEOUS | Status: DC | PRN

## 2022-06-01 MED ORDER — CALCITRIOL 0.25 MCG PO CAPS
2.0000 ug | ORAL_CAPSULE | ORAL | Status: DC
  Administered 2022-06-01 – 2022-06-03 (×2): 2 ug via ORAL
  Filled 2022-06-01 (×2): qty 8

## 2022-06-01 NOTE — Anesthesia Preprocedure Evaluation (Deleted)
Anesthesia Evaluation  Patient identified by MRN, date of birth, ID band Patient awake    Reviewed: Allergy & Precautions, H&P , NPO status , Patient's Chart, lab work & pertinent test results, reviewed documented beta blocker date and time   Airway Mallampati: II  TM Distance: >3 FB Neck ROM: full    Dental no notable dental hx.    Pulmonary shortness of breath, COPD, former smoker,    Pulmonary exam normal breath sounds clear to auscultation       Cardiovascular Exercise Tolerance: Good hypertension, + angina + CAD, + Peripheral Vascular Disease and +CHF   Rhythm:regular Rate:Normal     Neuro/Psych PSYCHIATRIC DISORDERS Anxiety  Neuromuscular disease CVA    GI/Hepatic Neg liver ROS, GERD  Medicated,  Endo/Other  negative endocrine ROSdiabetes  Renal/GU ESRF and DialysisRenal disease  negative genitourinary   Musculoskeletal   Abdominal   Peds  Hematology  (+) Blood dyscrasia, anemia ,   Anesthesia Other Findings   Reproductive/Obstetrics negative OB ROS                             Anesthesia Physical Anesthesia Plan  ASA: 3 and emergent  Anesthesia Plan: General   Post-op Pain Management:    Induction:   PONV Risk Score and Plan: Propofol infusion  Airway Management Planned:   Additional Equipment:   Intra-op Plan:   Post-operative Plan:   Informed Consent: I have reviewed the patients History and Physical, chart, labs and discussed the procedure including the risks, benefits and alternatives for the proposed anesthesia with the patient or authorized representative who has indicated his/her understanding and acceptance.     Dental Advisory Given  Plan Discussed with: CRNA  Anesthesia Plan Comments:         Anesthesia Quick Evaluation

## 2022-06-01 NOTE — Progress Notes (Signed)
[facility]                                                                               [name], is a 71 y.o. female, DOB - 28-Oct-1951, IRW:[id number] Admit date - 05/30/2022    Outpatient Primary MD for the patient is Tamara Fraise, MD  LOS - 2  days    Brief summary   [name] is a 71 y.o. female with medical history significant of CHF with grade 2 diastolic dysfunction, anemia, coronary artery disease, end-stage renal disease with dialysis Tuesday Thursday Saturday, COPD, diabetic neuropathy, GERD, hyperlipidemia, hypertension, insulin-dependent diabetes, stroke, peripheral vascular disease, and more presents ED with a chief complaint of chest pain.    Assessment & Plan   Symptomatic anemia: -s/p 1 unit of prbc transfusion.  - transfuse to keep hemoglobin greater 7.  - no bloody bowel movements.  Eliquis on hold for now.  GI on board , recommends outpatient EGD.    ESRD on HD.  Nephrology on board.    Hypokalemia:  Normalized.    Type 2 DM: with diabetic nephropathy.  CBG (last 3)  Recent Labs    05/31/22 1706 05/31/22 1952 06/01/22 0737  GLUCAP 276* 250* 147*   Uncontrolled with hyperglycemia.  Increased semglee to 20 units daily.  Continue with SSI.  Last hemoglobin A1c is 8.     Hypertension:  Optimal BP parameters.     PAF:  Rate controlled.  Continue with Diltiazem and atenolol.  Eliquis on hold    Constipation:     Hyperlipidemia:  Continue with crestor.     GAD:  Continue with atarax.   CAD:  S/P DES to LAD and DES to RCA in 03/2022. Continue with Brillinta.  Continue with BB and statin.    Pulmonary Hypertension;  RV dysfunction:  Not a candidate for advanced therapies.       Estimated body mass index is 32.49 kg/m as calculated from the following:   Height as of this encounter: 5\' 3"  (1.6 m).   Weight as of this encounter: 83.2 kg.  Code Status: full code.  DVT Prophylaxis:  SCDs  Start: 05/30/22 2214   Level of Care: Level of care: Telemetry Family Communication: none at bedside.   Disposition Plan:     Remains inpatient appropriate: possible d/c in am.   Procedures:  None.   Consultants:   Cardiology Nephrology.  Gastroenterology.   Antimicrobials:   Anti-infectives (From admission, onward)    None        Medications  Scheduled Meds:  atenolol  100 mg Oral QHS   calcitRIOL  2 mcg Oral Q T,Th,Sa-HD   calcium acetate  1,334 mg Oral TID with meals   Chlorhexidine Gluconate Cloth  6 each Topical Q0600   darbepoetin (ARANESP) injection - DIALYSIS  150 mcg Intravenous Q Thu-HD   diltiazem  240 mg Oral Daily   gabapentin  800 mg Oral Q T,Th,Sa-HD   insulin aspart  0-15 Units Subcutaneous TID WC   insulin aspart  0-5 Units Subcutaneous QHS   insulin detemir  15 Units Subcutaneous QHS   mometasone-formoterol  2 puff Inhalation BID  pantoprazole (PROTONIX) IV  40 mg Intravenous Q12H   QUEtiapine  200 mg Oral QHS   rOPINIRole  1 mg Oral QHS   rosuvastatin  5 mg Oral QHS   ticagrelor  90 mg Oral BID   Continuous Infusions:  sodium chloride     PRN Meds:.acetaminophen **OR** acetaminophen, albuterol, calcium acetate, hydrOXYzine, lidocaine (PF), lidocaine-prilocaine, morphine injection, ondansetron **OR** ondansetron (ZOFRAN) IV, oxyCODONE, pentafluoroprop-tetrafluoroeth    Subjective:   [name] was seen and examined today.   Objective:   Vitals:   06/01/22 1315 06/01/22 1330 06/01/22 1400 06/01/22 1430  BP: (!) 149/65 (!) 141/74 (!) 155/80 (!) 155/76  Pulse: 90 99 99 99  Resp: (!) 23 (!) 22 20 20   Temp:      TempSrc:      SpO2: 99% 100% 100% 100%  Weight:      Height:        Intake/Output Summary (Last 24 hours) at 06/01/2022 1533 Last data filed at 06/01/2022 0900 Gross per 24 hour  Intake 480 ml  Output --  Net 480 ml    Filed Weights   05/31/22 0000 06/01/22 0326 06/01/22 1049  Weight: 80 kg 82.2 kg 83.2 kg      Exam General exam: Appears calm and comfortable  Respiratory system: Clear to auscultation. Respiratory effort normal. Cardiovascular system: S1 & S2 heard, irregularly irregular. No JVD,  No pedal edema. Gastrointestinal system: Abdomen is nondistended, soft and nontender.  Normal bowel sounds heard. Central nervous system: Alert and oriented. No focal neurological deficits. Extremities: Symmetric 5 x 5 power. Skin: No rashes, lesions or ulcers Psychiatry:  Mood & affect appropriate.    Data Reviewed:  I have personally reviewed following labs and imaging studies   CBC Lab Results  Component Value Date   WBC 11.1 (H) 06/01/2022   RBC 3.29 (L) 06/01/2022   HGB 9.3 (L) 06/01/2022   HCT 28.3 (L) 06/01/2022   MCV 86.0 06/01/2022   MCH 28.3 06/01/2022   PLT 156 06/01/2022   MCHC 32.9 06/01/2022   RDW 16.8 (H) 06/01/2022   LYMPHSABS 0.9 05/31/2022   MONOABS 0.7 05/31/2022   EOSABS 0.4 05/31/2022   BASOSABS 0.0 49/70/2637     Last metabolic panel Lab Results  Component Value Date   NA 131 (L) 05/31/2022   K 3.9 05/31/2022   CL 92 (L) 05/31/2022   CO2 27 05/31/2022   BUN 28 (H) 05/31/2022   CREATININE 5.89 (H) 05/31/2022   GLUCOSE 278 (H) 05/31/2022   GFRNONAA 7 (L) 05/31/2022   GFRAA 4 (L) 11/01/2020   CALCIUM 7.9 (L) 05/31/2022   PHOS 4.9 (H) 04/27/2022   PROT 5.3 (L) 05/31/2022   ALBUMIN 2.4 (L) 05/31/2022   LABGLOB 1.9 03/17/2022   AGRATIO 2.1 03/17/2022   BILITOT 0.9 05/31/2022   ALKPHOS 63 05/31/2022   AST 23 05/31/2022   ALT 30 05/31/2022   ANIONGAP 12 05/31/2022    CBG (last 3)  Recent Labs    05/31/22 1706 05/31/22 1952 06/01/22 0737  GLUCAP 276* 250* 147*       Coagulation Profile: No results for input(s): "INR", "PROTIME" in the last 168 hours.   Radiology Studies: DG Chest 2 View  Result Date: 05/30/2022 CLINICAL DATA:  Chest pain EXAM: CHEST - 2 VIEW COMPARISON:  04/25/2022, 09/15/2020, 03/04/2022 FINDINGS: Cardiomegaly with  mild central congestion. Mild diffuse interstitial opacity without significant change. Aortic atherosclerosis. No pneumothorax. Hardware in the cervical spine. IMPRESSION: Cardiomegaly with slight central congestion. Mild  diffuse interstitial opacity without focal airspace disease. Electronically Signed   By: Donavan Foil M.D.   On: 05/30/2022 17:48       Hosie Poisson M.D. [facility] 06/01/2022, 3:33 PM  Available via Epic secure chat 7am-7pm After 7 pm, please refer to night coverage provider listed on amion.

## 2022-06-01 NOTE — Progress Notes (Signed)
Progress Note  Patient Name: [name] Date of Encounter: 06/01/2022  [facility] Cardiologist: Minus Breeding, MD   Subjective   Reports dyspnea with minimal activity and orthopnea overnight. Slept in the recliner. No chest pain or palpitations.   Inpatient Medications    Scheduled Meds:  atenolol  100 mg Oral QHS   calcium acetate  1,334 mg Oral TID with meals   diltiazem  240 mg Oral Daily   gabapentin  800 mg Oral Q T,Th,Sa-HD   insulin aspart  0-15 Units Subcutaneous TID WC   insulin aspart  0-5 Units Subcutaneous QHS   insulin detemir  15 Units Subcutaneous QHS   mometasone-formoterol  2 puff Inhalation BID   pantoprazole (PROTONIX) IV  40 mg Intravenous Q12H   QUEtiapine  200 mg Oral QHS   rOPINIRole  1 mg Oral QHS   rosuvastatin  5 mg Oral QHS   ticagrelor  90 mg Oral BID   Continuous Infusions:  sodium chloride     PRN Meds: acetaminophen **OR** acetaminophen, albuterol, calcium acetate, hydrOXYzine, morphine injection, ondansetron **OR** ondansetron (ZOFRAN) IV, oxyCODONE   Vital Signs    Vitals:   05/31/22 1951 05/31/22 2020 06/01/22 0326 06/01/22 0723  BP: (!) 155/62  (!) 140/59   Pulse: 81  84   Resp: 15  16   Temp: 99.4 F (37.4 C)  98.6 F (37 C)   TempSrc: Oral     SpO2: 99% 99% 98% 98%  Weight:   82.2 kg   Height:        Intake/Output Summary (Last 24 hours) at 06/01/2022 0835 Last data filed at 06/01/2022 0300 Gross per 24 hour  Intake 730 ml  Output --  Net 730 ml      06/01/2022    3:26 AM 05/31/2022   12:00 AM 05/30/2022    5:20 PM  Last 3 Weights  Weight (lbs) 181 lb 4.8 oz 176 lb 6.4 oz 177 lb  Weight (kg) 82.237 kg 80.015 kg 80.287 kg      Telemetry    Atrial fibrillation, HR in 70's to 80's. 3 beats NSVT.  - Personally Reviewed  ECG    No new tracings.   Physical Exam   GEN: Pleasant female appearing in no acute distress.   Neck: No carotid bruits.  Cardiac: Irregularly irregular, no murmurs, rubs, or  gallops.  Respiratory: Decreased breath sounds along bases bilaterally.  GI: Soft, nontender, non-distended  MS: 1+ pitting edema bilaterally; No deformity. Neuro:  Nonfocal  Psych: Normal affect   Labs    High Sensitivity Troponin:   Recent Labs  Lab 05/30/22 1803 05/30/22 1927  TROPONINIHS 89* 92*     Chemistry Recent Labs  Lab 05/30/22 1803 05/31/22 0520 05/31/22 1700  NA 134* 131*  --   K 3.4* 2.8* 3.9  CL 91* 92*  --   CO2 30 27  --   GLUCOSE 233* 278*  --   BUN 22 28*  --   CREATININE 5.01* 5.89*  --   CALCIUM 8.3* 7.9*  --   MG  --  2.0  --   PROT  --  5.3*  --   ALBUMIN  --  2.4*  --   AST  --  23  --   ALT  --  30  --   ALKPHOS  --  63  --   BILITOT  --  0.9  --   GFRNONAA 9* 7*  --   ANIONGAP  13 12  --     Lipids No results for input(s): "CHOL", "TRIG", "HDL", "LABVLDL", "LDLCALC", "CHOLHDL" in the last 168 hours.  Hematology Recent Labs  Lab 05/30/22 1803 05/31/22 0520 06/01/22 0521  WBC 11.7* 8.9 11.1*  RBC 2.72* 2.77* 3.29*  HGB 7.6* 7.7* 9.3*  HCT 23.8* 23.8* 28.3*  MCV 87.5 85.9 86.0  MCH 27.9 27.8 28.3  MCHC 31.9 32.4 32.9  RDW 19.2* 17.7* 16.8*  PLT 178 149* 156   Thyroid No results for input(s): "TSH", "FREET4" in the last 168 hours.  BNPNo results for input(s): "BNP", "PROBNP" in the last 168 hours.  DDimer No results for input(s): "DDIMER" in the last 168 hours.   Radiology    DG Chest 2 View  Result Date: 05/30/2022 CLINICAL DATA:  Chest pain EXAM: CHEST - 2 VIEW COMPARISON:  04/25/2022, 09/15/2020, 03/04/2022 FINDINGS: Cardiomegaly with mild central congestion. Mild diffuse interstitial opacity without significant change. Aortic atherosclerosis. No pneumothorax. Hardware in the cervical spine. IMPRESSION: Cardiomegaly with slight central congestion. Mild diffuse interstitial opacity without focal airspace disease. Electronically Signed   By: Donavan Foil M.D.   On: 05/30/2022 17:48    Cardiac Studies   LHC:  03/2022 Conclusions: Severe two-vessel coronary artery disease with 80-90% mid LAD and 99% proximal RCA stenoses (left-to-right collaterals noted). Patent ramus intermedius stent with minimal in-stent restenosis. Moderately elevated left ventricular filling pressure (LVEDP 30 mmHg). Heavily calcified right external iliac and common femoral arteries with 50% stenosis of the right CFA.   Recommendations: Findings discussed with Dr. Percival Spanish.  We have agreed to obtain cardiac surgery consultation for CABG given severe LAD and RCA disease with associated calcification.  If she is not a suitable surgical candidate, PCI to LAD and RCA +/- atherectomy would need to be considered once volume status and blood pressure have been optimized. Initiate IV nitroglycerin and IV furosemide for antianginal therapy, BP control, and diuresis.  More aggressive fluid removal via hemodialysis should also be considered. Aggressive secondary prevention.   Coronary Atherectomy: 03/2022 1.  Successful atherectomy, PTCA, and stenting of severe stenosis in the mid LAD, reducing 90% calcific stenosis to 0%, TIMI-3 flow pre and post, with a 3.0 x 18 mm Onyx frontier DES 2.  Successful atherectomy, PTCA, and stenting of severe stenosis in the mid RCA, reducing 95% calcific stenosis to 0%, TIMI-3 flow pre and post, with a 3.0 x 34 mm Onyx frontier DES   Recommendations: Patient with atrial fibrillation, significant comorbidities of end-stage renal disease.  Considering balance of antithrombotic efficacy and concern over bleeding risk with multidrug therapy, favor the following approach: Resume apixaban tomorrow at normal dosing schedule, no heparin overnight Ticagrelor 90 mg twice daily x3 months Aspirin 81 mg daily long-term after 3 months of ticagrelor completed  Echo: 03/2022 IMPRESSIONS     1. Left ventricular ejection fraction, by estimation, is 55%%. The left  ventricle has low normal function. The left ventricle  has no regional wall  motion abnormalities. There is mild left ventricular hypertrophy. Left  ventricular diastolic parameters  are indeterminate.   2. Compared to prior study, RV dysfunction is new      . Right ventricular systolic function moderate to severely decreased.  The right ventricular size is mildly enlarged. There is severely elevated  pulmonary artery systolic pressure.   3. Left atrial size was mildly dilated.   4. Right atrial size was mildly dilated.   5. A small pericardial effusion is present.   6. Mild mitral  valve regurgitation.   7. The aortic valve is tricuspid. Aortic valve regurgitation is not  visualized. Aortic valve sclerosis is present, with no evidence of aortic  valve stenosis.   8. The inferior vena cava is dilated in size with <50% respiratory  variability, suggesting right atrial pressure of 15 mmHg.   Patient Profile     71 y.o. female w/ PMH of CAD (s/p prior stenting in 2005 to RI, s/p NSTEMI in 03/2022 with cath showing 2-vessel CAD and not felt to be CABG candidate given RV dysfunction --> s/p staged PCI with DES to LAD and DES to RCA), persistent atrial fibrillation (s/p DCCV on 04/24/2022 --> recurrence by the time of her visit on 05/17/2022 and referred to EP for consideration of ablation), RV dysfunction/pulmonary HTN, COPD, HTN, HLD, HFpEF and ESRD who is currently admitted for worsening dyspnea. Found to have progressive anemia with Hgb at 7.6 on admission.   Assessment & Plan    1. CAD - She is s/p DES to LAD and DES to RCA in 03/2022 in the setting of an NSTEMI. Hs Troponin values have been at 89 and 92 this admission which is likely secondary to demand ischemia in the setting of her anemia. EKG is without acute ST changes.  - It has been recommended to continue Brilinta 90mg  BID as a single agent (intolerant to Plavix in the past) for now while holding ASA and Eliquis due to her anemia and GIB. Continue Atenolol 100mg  daily and Crestor 5mg  daily.    2. Persistent Atrial Fibrillation - She underwent DCCV last month and had recurrent atrial fibrillation at the time of her office visit on 05/17/2022. Was previously referred to EP for consideration of ablation as she is not felt to be a good candidate for antiarrhythmic therapy given her COPD, ESRD and CAD.  - She was on Eliquis 5mg  BID prior to admission but currently held given her anemia. Can perhaps consider Watchman as an outpatient once able to temporarily tolerate anticoagulation.   3. RV Dysfunction/Pulmonary HTN - Echo in 03/2022 showed her RV function was moderately to severely decreased with PASP elevated at 61.8 mmHg. Prior VQ Scan negative for PE. Felt to be secondary to Group 2 and 3. Not a likely candidate for advanced therapies given her comorbidities.   4. HTN - BP has been variable from 93/55 - 155/62 within the past 24 hours, at 140/59 on most recent check. She remains on Atenolol 100mg  daily and Cardizem CD 240mg  daily.  5. HLD - LDL was at 64 in 03/2022. Unclear if intolerant to higher-intensity statin therapy in the past as she has been on low-dose Crestor 5mg  once daily.   6. ESRD - Nephrology following. Planning for HD today.   7. Anemia/GIB - Hgb at 7.6 on admission and occult blood positive. She has received 2 units pRBC's and Hgb up to 9.3 today. GI following with plans for an EGD today.    For questions or updates, please contact [place] Please consult [email or web address] for contact info under        Signed, Erma Heritage, PA-C  06/01/2022, 8:35 AM

## 2022-06-01 NOTE — Consult Note (Signed)
Reason for Consult: To manage dialysis and dialysis related needs Referring Physician: Talecia Sherlin is an 71 y.o. female with past medical history significant for DM, HTN, COPD, obesity, CAD with grade 2 diastolic dysfunction and ESRD. She is s/p a hospitalization from 5/23 to 5/26 for PNA.  She presented to ER on 6/27 with chest pain, SOB without O2 at home-  found to be in Afib-  also a hgb of 7.6-  given a unit of blood-  held eliquis and ASA-  troponin flat-  decided to not convert as is rate controlled.  For anemia-  GI consulted and planning an EGD-  had a colonoscopy last year that was unremarkable.  We are asked to purovide her routine HD today .  Of note, her K has been low and they run her on a 1 K bath with HD-  last hgb I see is from 6/13 was 8.8- last K on 6/20 was 5.4.  She mainly is c/o SOB- has been getting to her EDW but thinks has also lost weight-  has edema    Dialysis Orders:    As of 5/26 Center: [facility]  on TTS . EDW 82 kg HD Bath 1K/2.5Ca  Time 4 hours Heparin 1000 units bolus then 1000 unts/hr. Access RUE AVF BFR 350 DFR 500    Calcitriol 2.25 mcg po/HD Micera 50 mcg IVP every 2 weeks    Past Medical History:  Diagnosis Date   Acute on chronic diastolic CHF (congestive heart failure) ([place]) 03/05/2015   Grade 2. EF 60-65%   Anemia    hx of   Anxiety    Arthritis    CAD (coronary artery disease)    s/p Stenting in 2005;  [place] 7/09: Vigorous LV function, 2-3+ MR, RI 40%, RI stent patent, proximal-mid RCA 40-50%;  [place] 7/09:  EF 55-65%, trivial MR;  [place] 11/18/12: EF 66%, normal LV wall motion, mild to moderate anterior ischemia - reviewed by [facility] and felt to rep breast atten (low risk);  Echo 6/14: mild LVH, EF 55-65%, Gr 2 DD, PASP 44, trivial eff    Chronic kidney disease    CREATININE IS UP--GOING TO KIDNEY MD    Chronic neck pain    COPD (chronic obstructive pulmonary disease) (H[facility])    Degenerative joint disease    left shoulder   Diabetic neuropathy  (HCC)    Diverticulosis    Esophageal stricture    GERD (gastroesophageal reflux disease)    Hyperlipidemia    Hypertension    IDDM (insulin dependent diabetes mellitus)    Pericardial effusion 03/06/2015   Very small per echo ; pleural nodules seen on CT chest.   Peripheral vascular disease ([place])    Stroke ([place])    "mini- years ago"   Vitamin D deficiency     Past Surgical History:  Procedure Laterality Date   A/V FISTULAGRAM N/A 08/10/2017   Procedure: A/V Fistulagram - Right Arm;  Surgeon: Elam Dutch, MD;  Location: [place] [facility];  Service: Cardiovascular;  Laterality: N/A;   A/V FISTULAGRAM N/A 03/03/2020   Procedure: A/V FISTULAGRAM - Right Arm;  Surgeon: Marty Heck, MD;  Location: [place] [facility];  Service: Cardiovascular;  Laterality: N/A;   ABDOMINAL HYSTERECTOMY     AV FISTULA PLACEMENT Right 07/20/2015   Procedure: RADIAL CEPHALIC ARTERIOVENOUS FISTULA CREATION RIGHT ARM;  Surgeon: Angelia Mould, MD;  Location: [place];  Service: Vascular;  Laterality: Right;   AV  FISTULA PLACEMENT Right 11/26/2015   Procedure:  Creation of RIGHT BRACHIO-CEPHALIC arteriovenous fistula;  Surgeon: Angelia Mould, MD;  Location: [facility] OR;  Service: Vascular;  Laterality: Right;   BACK SURGERY     2007 & 2013   [place]  2003, 2005, 2009   1 stent   cardiac stents     CARDIOVERSION N/A 04/24/2022   Procedure: CARDIOVERSION;  Surgeon: Fay Records, MD;  Location: [facility] ENDOSCOPY;  Service: Cardiovascular;  Laterality: N/A;   CATARACT EXTRACTION W/PHACO Right 05/04/2014   Procedure: CATARACT EXTRACTION PHACO AND INTRAOCULAR LENS PLACEMENT ([place]);  Surgeon: Williams Che, MD;  Location: [facility];  Service: Ophthalmology;  Laterality: Right;  CDE 1.47   CATARACT EXTRACTION W/PHACO Left 07/20/2014   Procedure: CATARACT EXTRACTION WITH PHACO AND INTRAOCULAR LENS PLACEMENT LEFT EYE CDE=1.79;  Surgeon: Williams Che, MD;   Location: [facility];  Service: Ophthalmology;  Laterality: Left;   CERVICAL SPINE SURGERY  2012   8 screws   CORONARY ATHERECTOMY N/A 03/08/2022   Procedure: CORONARY ATHERECTOMY;  Surgeon: Sherren Mocha, MD;  Location: [place] [facility];  Service: Cardiovascular;  Laterality: N/A;   CYSTOSCOPY WITH INJECTION N/A 06/03/2013   Procedure: MACROPLASTIQUE  INJECTION ;  Surgeon: Malka So, MD;  Location: [facility];  Service: Urology;  Laterality: N/A;   FISTULOGRAM Right 10/20/2016   Procedure: FISTULOGRAM WITH POSSIBLE INTERVENTION;  Surgeon: [name], MD;  Location: [facility];  Service: Vascular;  Laterality: Right;   INSERTION OF DIALYSIS CATHETER N/A 11/26/2015   Procedure: INSERTION OF DIALYSIS CATHETER;  Surgeon: Angelia Mould, MD;  Location: [place];  Service: Vascular;  Laterality: N/A;   INTRAVASCULAR IMAGING/OCT N/A 03/08/2022   Procedure: INTRAVASCULAR IMAGING/OCT;  Surgeon: Sherren Mocha, MD;  Location: [place] [facility];  Service: Cardiovascular;  Laterality: N/A;   LAMINECTOMY  12/29/2011   LEFT HEART CATH AND CORONARY ANGIOGRAPHY N/A 03/06/2022   Procedure: LEFT HEART CATH AND CORONARY ANGIOGRAPHY;  Surgeon: Nelva Bush, MD;  Location: [place] [facility];  Service: Cardiovascular;  Laterality: N/A;   LUMBAR LAMINECTOMY/DECOMPRESSION MICRODISCECTOMY  12/29/2011   Procedure: LUMBAR LAMINECTOMY/DECOMPRESSION MICRODISCECTOMY;  Surgeon: Floyce Stakes, MD;  Location: [place] NEURO ORS;  Service: Neurosurgery;  Laterality: N/A;  Lumbar Two through Lumbar Five Laminectomies/Cellsaver   PARTIAL HYSTERECTOMY     PERIPHERAL VASCULAR BALLOON ANGIOPLASTY  03/03/2020   Procedure: PERIPHERAL VASCULAR BALLOON ANGIOPLASTY;  Surgeon: Marty Heck, MD;  Location: [place] [facility];  Service: Cardiovascular;;  Rt arm fistula   PERIPHERAL VASCULAR BALLOON ANGIOPLASTY Right 04/15/2021   Procedure: PERIPHERAL VASCULAR BALLOON ANGIOPLASTY;  Surgeon: Angelia Mould, MD;  Location: [place] [facility];  Service: Cardiovascular;  Laterality: Right;  arm fistula   PERIPHERAL VASCULAR BALLOON ANGIOPLASTY  03/14/2022   Procedure: PERIPHERAL VASCULAR BALLOON ANGIOPLASTY;  Surgeon: Broadus John, MD;  Location: [place] [facility];  Service: Cardiovascular;;  right arm AVF   PERIPHERAL VASCULAR CATHETERIZATION N/A 11/22/2015   Procedure: Fistulagram;  Surgeon: Angelia Mould, MD;  Location: [place] [facility];  Service: Cardiovascular;  Laterality: N/A;   PERIPHERAL VASCULAR INTERVENTION  08/10/2017   Procedure: PERIPHERAL VASCULAR INTERVENTION;  Surgeon: Elam Dutch, MD;  Location: [place] [facility];  Service: Cardiovascular;;   SHOULDER SURGERY Right    Rotator cuff    Family History  Problem Relation Age of Onset   Heart disease Other    Hypertension  Father    Cancer Father    Hypertension Mother    Hypertension Daughter    Pancreatitis Brother    Hypertension Daughter    Diabetes Daughter     Social History:  reports that she quit smoking about 20 years ago. Her smoking use included cigarettes. She has a 30.00 pack-year smoking history. She has never been exposed to tobacco smoke. She has never used smokeless tobacco. She reports that she does not drink alcohol and does not use drugs.  Allergies:  Allergies  Allergen Reactions   Azithromycin     Prolonged QT   Ace Inhibitors Cough   Clopidogrel Bisulfate Other (See Comments)    Sick, Headache, "Felt terrible"   Codeine Other (See Comments)    hallucinations   Lisinopril Cough   Penicillins Other (See Comments)    Unknown childhood reaction Did it involve swelling of the face/tongue/throat, SOB, or low BP? Unknown Did it involve sudden or severe rash/hives, skin peeling, or any reaction on the inside of your mouth or nose? Unknown Did you need to seek medical attention at a hospital or doctor's office? Unknown When did it last happen?      childhood allergy If all above answers are "NO", may  proceed with cephalosporin use.      Medications: I have reviewed the patient's current medications.  Results for orders placed or performed during the hospital encounter of 05/30/22 (from the past 48 hour(s))  Basic metabolic panel     Status: Abnormal   Collection Time: 05/30/22  6:03 PM  Result Value Ref Range   Sodium 134 (L) 135 - 145 mmol/L   Potassium 3.4 (L) 3.5 - 5.1 mmol/L   Chloride 91 (L) 98 - 111 mmol/L   CO2 30 22 - 32 mmol/L   Glucose, Bld 233 (H) 70 - 99 mg/dL    Comment: Glucose reference range applies only to samples taken after fasting for at least 8 hours.   BUN 22 8 - 23 mg/dL   Creatinine, Ser 5.01 (H) 0.44 - 1.00 mg/dL   Calcium 8.3 (L) 8.9 - 10.3 mg/dL   GFR, Estimated 9 (L) >60 mL/min    Comment: (NOTE) Calculated using the CKD-EPI Creatinine Equation (2021)    Anion gap 13 5 - 15    Comment: Performed at [facility], [address] [place], [place] [address]  CBC     Status: Abnormal   Collection Time: 05/30/22  6:03 PM  Result Value Ref Range   WBC 11.7 (H) 4.0 - 10.5 K/uL   RBC 2.72 (L) 3.87 - 5.11 MIL/uL   Hemoglobin 7.6 (L) 12.0 - 15.0 g/dL   HCT 23.8 (L) 36.0 - 46.0 %   MCV 87.5 80.0 - 100.0 fL   MCH 27.9 26.0 - 34.0 pg   MCHC 31.9 30.0 - 36.0 g/dL   RDW 19.2 (H) 11.5 - 15.5 %   Platelets 178 150 - 400 K/uL   nRBC 0.0 0.0 - 0.2 %    Comment: Performed at [facility], [address], [place], [place] [address]  Troponin I (High Sensitivity)     Status: Abnormal   Collection Time: 05/30/22  6:03 PM  Result Value Ref Range   Troponin I (High Sensitivity) 89 (H) <18 ng/L    Comment: (NOTE) Elevated high sensitivity troponin I (hsTnI) values and significant  changes across serial measurements may suggest ACS but many other  chronic and acute conditions are known to elevate hsTnI results.  Refer  to the "Links" section for chest pain algorithms and additional  guidance. Performed at [facility], [address], [place], [place] [address]    Troponin I (High Sensitivity)     Status: Abnormal   Collection Time: 05/30/22  7:27 PM  Result Value Ref Range   Troponin I (High Sensitivity) 92 (H) <18 ng/L    Comment: (NOTE) Elevated high sensitivity troponin I (hsTnI) values and significant  changes across serial measurements may suggest ACS but many other  chronic and acute conditions are known to elevate hsTnI results.  Refer to the "Links" section for chest pain algorithms and additional  guidance. Performed at [facility], [address] [place], [place] [address]   POC occult blood, ED     Status: Abnormal   Collection Time: 05/30/22  9:11 PM  Result Value Ref Range   Fecal Occult Bld POSITIVE (A) NEGATIVE  Prepare RBC (crossmatch)     Status: None   Collection Time: 05/30/22 10:26 PM  Result Value Ref Range   Order Confirmation      ORDER PROCESSED BY BLOOD BANK Performed at [facility], [address] [place], [place] [address]   Type and screen [facility]     Status: None   Collection Time: 05/30/22 10:26 PM  Result Value Ref Range   ABO/RH(D) B NEG    Antibody Screen NEG    Sample Expiration 06/02/2022,2359    Unit Number [id number]    Blood Component Type RED CELLS,LR    Unit division 00    Status of Unit ISSUED,FINAL    Transfusion Status OK TO TRANSFUSE    Crossmatch Result      Compatible Performed at [facility], [address], [place], [place] [address]    Unit Number [id number]    Blood Component Type RED CELLS,LR    Unit division 00    Status of Unit ISSUED,FINAL    Transfusion Status OK TO TRANSFUSE    Crossmatch Result Compatible   Prepare RBC (crossmatch)     Status: None   Collection Time: 05/30/22 10:26 PM  Result Value Ref Range   Order Confirmation      ORDER PROCESSED BY BLOOD BANK Performed at [facility], [address], [place], [place] [address]   Glucose, capillary     Status: Abnormal   Collection Time: 05/31/22  1:26 AM  Result Value Ref Range    Glucose-Capillary 194 (H) 70 - 99 mg/dL    Comment: Glucose reference range applies only to samples taken after fasting for at least 8 hours.  Comprehensive metabolic panel     Status: Abnormal   Collection Time: 05/31/22  5:20 AM  Result Value Ref Range   Sodium 131 (L) 135 - 145 mmol/L   Potassium 2.8 (L) 3.5 - 5.1 mmol/L    Comment: DELTA CHECK NOTED   Chloride 92 (L) 98 - 111 mmol/L   CO2 27 22 - 32 mmol/L   Glucose, Bld 278 (H) 70 - 99 mg/dL    Comment: Glucose reference range applies only to samples taken after fasting for at least 8 hours.   BUN 28 (H) 8 - 23 mg/dL   Creatinine, Ser 5.89 (H) 0.44 - 1.00 mg/dL   Calcium 7.9 (L) 8.9 - 10.3 mg/dL   Total Protein 5.3 (L) 6.5 - 8.1 g/dL   Albumin 2.4 (L) 3.5 - 5.0 g/dL   AST 23 15 - 41 U/L   ALT 30 0 - 44 U/L  Alkaline Phosphatase 63 38 - 126 U/L   Total Bilirubin 0.9 0.3 - 1.2 mg/dL   GFR, Estimated 7 (L) >60 mL/min    Comment: (NOTE) Calculated using the CKD-EPI Creatinine Equation (2021)    Anion gap 12 5 - 15    Comment: Performed at [facility], [address] [place], [place] [address]  Magnesium     Status: None   Collection Time: 05/31/22  5:20 AM  Result Value Ref Range   Magnesium 2.0 1.7 - 2.4 mg/dL    Comment: Performed at [facility], [address], [place], [place] [address]  CBC with Differential/Platelet     Status: Abnormal   Collection Time: 05/31/22  5:20 AM  Result Value Ref Range   WBC 8.9 4.0 - 10.5 K/uL   RBC 2.77 (L) 3.87 - 5.11 MIL/uL   Hemoglobin 7.7 (L) 12.0 - 15.0 g/dL   HCT 23.8 (L) 36.0 - 46.0 %   MCV 85.9 80.0 - 100.0 fL   MCH 27.8 26.0 - 34.0 pg   MCHC 32.4 30.0 - 36.0 g/dL   RDW 17.7 (H) 11.5 - 15.5 %   Platelets 149 (L) 150 - 400 K/uL   nRBC 0.0 0.0 - 0.2 %   Neutrophils Relative % 79 %   Neutro Abs 6.9 1.7 - 7.7 K/uL   Lymphocytes Relative 10 %   Lymphs Abs 0.9 0.7 - 4.0 K/uL   Monocytes Relative 7 %   Monocytes Absolute 0.7 0.1 - 1.0 K/uL   Eosinophils Relative 4 %    Eosinophils Absolute 0.4 0.0 - 0.5 K/uL   Basophils Relative 0 %   Basophils Absolute 0.0 0.0 - 0.1 K/uL   Immature Granulocytes 0 %   Abs Immature Granulocytes 0.02 0.00 - 0.07 K/uL    Comment: Performed at [facility], [address] [place], [place] [address]  Glucose, capillary     Status: Abnormal   Collection Time: 05/31/22  7:30 AM  Result Value Ref Range   Glucose-Capillary 207 (H) 70 - 99 mg/dL    Comment: Glucose reference range applies only to samples taken after fasting for at least 8 hours.  Glucose, capillary     Status: Abnormal   Collection Time: 05/31/22 11:33 AM  Result Value Ref Range   Glucose-Capillary 235 (H) 70 - 99 mg/dL    Comment: Glucose reference range applies only to samples taken after fasting for at least 8 hours.  Potassium     Status: None   Collection Time: 05/31/22  5:00 PM  Result Value Ref Range   Potassium 3.9 3.5 - 5.1 mmol/L    Comment: DELTA CHECK NOTED Performed at [facility], [address], [place], [place] [address]   Glucose, capillary     Status: Abnormal   Collection Time: 05/31/22  5:06 PM  Result Value Ref Range   Glucose-Capillary 276 (H) 70 - 99 mg/dL    Comment: Glucose reference range applies only to samples taken after fasting for at least 8 hours.   Comment 1 Notify RN    Comment 2 Document in Chart   Glucose, capillary     Status: Abnormal   Collection Time: 05/31/22  7:52 PM  Result Value Ref Range   Glucose-Capillary 250 (H) 70 - 99 mg/dL    Comment: Glucose reference range applies only to samples taken after fasting for at least 8 hours.  CBC     Status: Abnormal   Collection Time: 06/01/22  5:21 AM  Result  Value Ref Range   WBC 11.1 (H) 4.0 - 10.5 K/uL   RBC 3.29 (L) 3.87 - 5.11 MIL/uL   Hemoglobin 9.3 (L) 12.0 - 15.0 g/dL   HCT 28.3 (L) 36.0 - 46.0 %   MCV 86.0 80.0 - 100.0 fL   MCH 28.3 26.0 - 34.0 pg   MCHC 32.9 30.0 - 36.0 g/dL   RDW 16.8 (H) 11.5 - 15.5 %   Platelets 156 150 - 400 K/uL   nRBC 0.0 0.0 -  0.2 %    Comment: Performed at [facility], [address], [place], [place] [address]  Glucose, capillary     Status: Abnormal   Collection Time: 06/01/22  7:37 AM  Result Value Ref Range   Glucose-Capillary 147 (H) 70 - 99 mg/dL    Comment: Glucose reference range applies only to samples taken after fasting for at least 8 hours.    DG Chest 2 View  Result Date: 05/30/2022 CLINICAL DATA:  Chest pain EXAM: CHEST - 2 VIEW COMPARISON:  04/25/2022, 09/15/2020, 03/04/2022 FINDINGS: Cardiomegaly with mild central congestion. Mild diffuse interstitial opacity without significant change. Aortic atherosclerosis. No pneumothorax. Hardware in the cervical spine. IMPRESSION: Cardiomegaly with slight central congestion. Mild diffuse interstitial opacity without focal airspace disease. Electronically Signed   By: Donavan Foil M.D.   On: 05/30/2022 17:48    ROS: SOB and DOE Blood pressure (!) 140/59, pulse 84, temperature 98.6 F (37 C), resp. rate 16, height 5\' 3"  (1.6 m), weight 82.2 kg, SpO2 98 %. General appearance: alert and mild distress Resp: diminished breath sounds bibasilar Cardio: irregularly irregular rhythm GI: soft, non-tender; bowel sounds normal; no masses,  no organomegaly Extremities: edema at least 1+ Left upper arm AVF-  positive thrill and bruit   Assessment/Plan: 71 year old female with extensive past medical history including ESRD-  presents with CP, Afib and hgb in the 7's 1 acute on chronic anemia-  on [facility] which has been held for now.  GI to do EGD to investigate for any source of bleeding-  no obvious sxms-  hgb bumped with transfusion-  will continue with ESA 2 ESRD: normally TTS at [facility] via AVF-  had full HD on Tuesday-  will be due today on schedule.  I can run her on a high K bath to help correct her K-  she may not need to be run on a 1 K at an OP ?  3 Hypertension: she seems overloaded to me-  on tenormin and cardizem-  will pull what we can with HD today ( she is  concerned that she will cramp ) 4. Anemia of ESRD: last hgb as OP 8.8-  work up as above-  continue ESA 5. Metabolic Bone Disease: will continue home calcitriol and phoslo  6. Hypokalemia-  runs on 1 K bath as OP-  may need to be modified.  Can run on high K bath today   Louis Meckel 06/01/2022, 8:36 AM

## 2022-06-01 NOTE — Progress Notes (Signed)
Patient has had c/o sob entire shift.  Patient states she has these same issues at home and is not able to sleep in the bed. Patient states she sleeps sitting up at home.  Patient oxygen saturation has been stable, has been on room air most of shift, on oxygen  @ 2 liters sporadically.  Patient has sat in chair most of shift.  Patient did tolerate potassium IV.

## 2022-06-01 NOTE — Progress Notes (Signed)
Physical Therapy Treatment Patient Details Name: [name] MRN: [id number] DOB: Jul 22, 1951 Today's Date: 06/01/2022   History of Present Illness [name] is a 71 y.o. female with medical history significant of CHF with grade 2 diastolic dysfunction, anemia, coronary artery disease, end-stage renal disease with dialysis Tuesday Thursday Saturday, COPD, diabetic neuropathy, GERD, hyperlipidemia, hypertension, insulin-dependent diabetes, stroke, peripheral vascular disease, and more presents ED with a chief complaint of chest pain.  Patient reports that she had a pressure-like chest pain in the center of her chest.  She reports she has associated palpitations.  This started 4 days ago.  The pain and palpitations have been constant.  They are not necessarily exertional.  Patient reports sometimes it is worse when she is standing and when she is lying down.  She has had peripheral edema in her lower extremities since April per her report.  She was taking Lasix pill, but at her last admission she was told to discontinue it so she has not been taking it.  Patient reports to chronic nausea.  She had dyspnea, and does not have O2 at home.  She is on 2 L O2 nasal cannula at the time of my exam.  She has no cough.  Patient has no other complaints at this time.  Patient does not smoke, does not drink, does not use illicit drugs.  She is vaccinated for COVID.  Patient is full code.    PT Comments    Patient presents sitting in chair  on room air and consents to PT treatment session. Patient able to tolerate therapeutic exercises but demonstrates fatigue secondary to deconditioning. Patient is modified independent with transfers and use of RW. Initiates movement with slight rocking motion indicating fair trunk control with slightly labored movement. Patient was able to ambulate 70 feet with supervision and use of RW. Slightly labored cadence with fair coordination and balance. Patient was primarily limited  by fatigue with need of frequent breaks. Vitals assessed during gait in which SpO2 averaged 98% during gait. Patient tolerated sitting up in chair after therapy. Patient will benefit from continued skilled physical therapy in hospital and recommended venue below to increase strength, balance, endurance for safe ADLs and gait.    Recommendations for follow up therapy are one component of a multi-disciplinary discharge planning process, led by the attending physician.  Recommendations may be updated based on patient status, additional functional criteria and insurance authorization.  Follow Up Recommendations  Home health PT     Assistance Recommended at Discharge PRN  Patient can return home with the following A little help with walking and/or transfers;A little help with bathing/dressing/bathroom;Assistance with cooking/housework;Help with stairs or ramp for entrance   Equipment Recommendations  None recommended by PT    Recommendations for Other Services       Precautions / Restrictions Precautions Precautions: Fall Restrictions Weight Bearing Restrictions: No     Mobility  Bed Mobility                    Transfers Overall transfer level: Modified independent Equipment used: Rolling walker (2 wheels)               General transfer comment: Modified independent with transfers with slight use of rocking to initiate STS and slight labored movement    Ambulation/Gait Ambulation/Gait assistance: Supervision Gait Distance (Feet): 70 Feet Assistive device: Rolling walker (2 wheels) Gait Pattern/deviations: Decreased step length - right, Decreased step length - left, Decreased stride length Gait  velocity: decreased     General Gait Details: Slightly labored cadence with fair coordination and balance. Primarily limited by fatigue and deconditioning. Vitals assessed during gait.   Stairs             Wheelchair Mobility    Modified Rankin (Stroke Patients  Only)       Balance Overall balance assessment: Mild deficits observed, not formally tested                                          Cognition Arousal/Alertness: Awake/alert Behavior During Therapy: WFL for tasks assessed/performed Overall Cognitive Status: Within Functional Limits for tasks assessed                                          Exercises General Exercises - Lower Extremity Long Arc Quad: AROM, Strengthening, Seated, 10 reps, Both Hip Flexion/Marching: AROM, 10 reps, Both, Seated, Strengthening Toe Raises: AROM, 20 reps, Strengthening, Seated, Both Heel Raises: AROM, 20 reps, Strengthening, Both, Seated    General Comments        Pertinent Vitals/Pain Pain Assessment Pain Assessment: 0-10 Pain Score: 8  Pain Location: chest Pain Descriptors / Indicators: Tightness Pain Intervention(s): Limited activity within patient's tolerance, Monitored during session, Repositioned    Home Living                          Prior Function            PT Goals (current goals can now be found in the care plan section) Acute Rehab PT Goals Patient Stated Goal: return home with family to assist PT Goal Formulation: With patient Time For Goal Achievement: 06/02/22 Potential to Achieve Goals: Good Progress towards PT goals: Progressing toward goals    Frequency    Min 2X/week      PT Plan Current plan remains appropriate    Co-evaluation              AM-PAC PT "6 Clicks" Mobility   Outcome Measure  Help needed turning from your back to your side while in a flat bed without using bedrails?: None Help needed moving from lying on your back to sitting on the side of a flat bed without using bedrails?: None Help needed moving to and from a bed to a chair (including a wheelchair)?: A Little Help needed standing up from a chair using your arms (e.g., wheelchair or bedside chair)?: None Help needed to walk in hospital  room?: A Little Help needed climbing 3-5 steps with a railing? : A Little 6 Click Score: 21    End of Session   Activity Tolerance: Patient tolerated treatment well;Patient limited by fatigue Patient left: in chair;with call bell/phone within reach Nurse Communication: Mobility status PT Visit Diagnosis: Unsteadiness on feet (R26.81);Other abnormalities of gait and mobility (R26.89);Muscle weakness (generalized) (M62.81)     Time: 2992-4268 PT Time Calculation (min) (ACUTE ONLY): 20 min  Charges:  $Gait Training: 8-22 mins $Therapeutic Exercise: 8-22 mins                     3:33 PM, 06/01/22 Lonell Grandchild, MPT Physical Therapist with [facility] [phone] office [phone] mobile phone

## 2022-06-01 NOTE — Procedures (Signed)
HEMODIALYSIS TREATMENT NOTE:   4 hour heparin-free treatment completed.  Pt reluctant to increase goal.  "I'm afraid I'm gonna cramp."  Hemodynamically stable - tolerated removal of 3 liters without interruption in UF.  All blood was returned.  Hemostasis was achieved in 30 minutes.  Report given to primary nurse Rise Patience.   HBV serologies obtained from [facility]:  Infection/Serology Draw Date Description Result/Unit Interpretation Ref Range  02 May 2022  Hepatitis B virus surface [place]:LEZV:GJ:FTN/BZXY:DS:WV:  >1000 mIU/mL    0.0-9.0      Rockwell Alexandria, RN

## 2022-06-01 NOTE — Evaluation (Signed)
Occupational Therapy Evaluation Patient Details Name: [name] MRN: [id number] DOB: June 01, 1951 Today's Date: 06/01/2022   History of Present Illness [name] is a 71 y.o. female with medical history significant of CHF with grade 2 diastolic dysfunction, anemia, coronary artery disease, end-stage renal disease with dialysis Tuesday Thursday Saturday, COPD, diabetic neuropathy, GERD, hyperlipidemia, hypertension, insulin-dependent diabetes, stroke, peripheral vascular disease, and more presents ED with a chief complaint of chest pain.  Patient reports that she had a pressure-like chest pain in the center of her chest.  She reports she has associated palpitations.  This started 4 days ago.  The pain and palpitations have been constant.  They are not necessarily exertional.  Patient reports sometimes it is worse when she is standing and when she is lying down.  She has had peripheral edema in her lower extremities since April per her report.  She was taking Lasix pill, but at her last admission she was told to discontinue it so she has not been taking it.  Patient reports to chronic nausea.  She had dyspnea, and does not have O2 at home.  She is on 2 L O2 nasal cannula at the time of my exam.  She has no cough.  Patient has no other complaints at this time.  Patient does not smoke, does not drink, does not use illicit drugs.  She is vaccinated for COVID.  Patient is full code.   Clinical Impression   Pt appeared fatigue needing encouragement to participate in OT evaluation. Pt demonstrated general weakness in B UE which pt reports is at baseline levels. Pt was able to doff and don sock seated in recliner with labored movement. Pt also able to transfer to toilet from chair with intermittent leaning on walls, which pt reports is also baseline. Pt appears to be at or near baseline levels. Pt reported having home health services prior to admission. Recommend pt continue with home health OT at home to  increase strength and endurance. Pt is not recommended for further acute OT services and will be discharged to care of nursing staff for remaining length of stay.       Recommendations for follow up therapy are one component of a multi-disciplinary discharge planning process, led by the attending physician.  Recommendations may be updated based on patient status, additional functional criteria and insurance authorization.   Follow Up Recommendations  Home health OT    Assistance Recommended at Discharge PRN        Functional Status Assessment  Patient has had a recent decline in their functional status and/or demonstrates limited ability to make significant improvements in function in a reasonable and predictable amount of time  Equipment Recommendations  None recommended by OT    Recommendations for Other Services       Precautions / Restrictions Precautions Precautions: Fall Restrictions Weight Bearing Restrictions: No      Mobility Bed Mobility                    Transfers Overall transfer level: Modified independent                 General transfer comment: Able to transfer to toilet from chair leaning on wall which pt reports at baseline.      Balance Overall balance assessment: Mild deficits observed, not formally tested  ADL either performed or assessed with clinical judgement   ADL Overall ADL's : Modified independent                                       General ADL Comments: Labored movement but completing toilet transfer and lower body dressing without assist.     Vision Baseline Vision/History: 1 Wears glasses Ability to See in Adequate Light: 1 Impaired Patient Visual Report: No change from baseline Vision Assessment?: No apparent visual deficits                Pertinent Vitals/Pain Pain Assessment Pain Assessment: 0-10 Pain Score: 8  Pain Location:  chest Pain Descriptors / Indicators: Tightness Pain Intervention(s): Limited activity within patient's tolerance, Monitored during session, Repositioned     Hand Dominance Right   Extremity/Trunk Assessment Upper Extremity Assessment Upper Extremity Assessment: Generalized weakness   Lower Extremity Assessment Lower Extremity Assessment: Defer to PT evaluation   Cervical / Trunk Assessment Cervical / Trunk Assessment: Normal   Communication Communication Communication: No difficulties   Cognition Arousal/Alertness: Awake/alert Behavior During Therapy: WFL for tasks assessed/performed Overall Cognitive Status: Within Functional Limits for tasks assessed                                                        Home Living Family/patient expects to be discharged to:: Private residence Living Arrangements: Parent Available Help at Discharge: Family;Available PRN/intermittently Type of Home: House Home Access: Stairs to enter [employer] of Steps: 2 Entrance Stairs-Rails: Right;Left Home Layout: One level     Bathroom Shower/Tub: Sponge bathes at baseline;Walk-in shower   Bathroom Toilet: Standard Bathroom Accessibility: Yes   Home Equipment: Clinical biochemist (2 wheels);Cane - single point          Prior Functioning/Environment Prior Level of Function : Independent/Modified Independent             Mobility Comments: Independent, drives to dialysis, does not cook (102 y/o mother does), Hydrographic surveyor using SPC PRN ADLs Comments: Independent                                                               End of Session Nurse Communication: Other (comment) (Nurse present in room at end of session.)  Activity Tolerance: Patient tolerated treatment well Patient left: in chair;with call bell/phone within reach;with nursing/sitter in room  OT Visit Diagnosis: Muscle weakness  (generalized) (M62.81);Pain Pain - part of body:  (chest pain)                Time: 2119-4174 OT Time Calculation (min): 10 min Charges:  OT General Charges $OT Visit: 1 Visit OT Evaluation $OT Eval Low Complexity: 1 Low  Kamani Lewter OT, MOT  Larey Seat 06/01/2022, 8:37 AM

## 2022-06-02 ENCOUNTER — Inpatient Hospital Stay (HOSPITAL_COMMUNITY): Payer: Medicare Other | Admitting: Certified Registered"

## 2022-06-02 ENCOUNTER — Encounter (HOSPITAL_COMMUNITY): Payer: Self-pay | Admitting: Family Medicine

## 2022-06-02 ENCOUNTER — Encounter (HOSPITAL_COMMUNITY)
Admission: EM | Disposition: A | Discharge status: Home / Self Care | Payer: Self-pay | Source: Home / Self Care | Attending: Internal Medicine

## 2022-06-02 DIAGNOSIS — N186 End stage renal disease: Secondary | ICD-10-CM | POA: Diagnosis not present

## 2022-06-02 DIAGNOSIS — Z7985 Long-term (current) use of injectable non-insulin antidiabetic drugs: Secondary | ICD-10-CM

## 2022-06-02 DIAGNOSIS — J449 Chronic obstructive pulmonary disease, unspecified: Secondary | ICD-10-CM | POA: Diagnosis not present

## 2022-06-02 DIAGNOSIS — Z794 Long term (current) use of insulin: Secondary | ICD-10-CM

## 2022-06-02 DIAGNOSIS — Z992 Dependence on renal dialysis: Secondary | ICD-10-CM | POA: Diagnosis not present

## 2022-06-02 DIAGNOSIS — I4819 Other persistent atrial fibrillation: Secondary | ICD-10-CM | POA: Diagnosis not present

## 2022-06-02 DIAGNOSIS — K31811 Angiodysplasia of stomach and duodenum with bleeding: Secondary | ICD-10-CM | POA: Diagnosis not present

## 2022-06-02 DIAGNOSIS — D509 Iron deficiency anemia, unspecified: Secondary | ICD-10-CM

## 2022-06-02 DIAGNOSIS — Z87891 Personal history of nicotine dependence: Secondary | ICD-10-CM

## 2022-06-02 DIAGNOSIS — D649 Anemia, unspecified: Secondary | ICD-10-CM | POA: Diagnosis not present

## 2022-06-02 DIAGNOSIS — E1159 Type 2 diabetes mellitus with other circulatory complications: Secondary | ICD-10-CM | POA: Diagnosis not present

## 2022-06-02 DIAGNOSIS — E1151 Type 2 diabetes mellitus with diabetic peripheral angiopathy without gangrene: Secondary | ICD-10-CM | POA: Diagnosis not present

## 2022-06-02 DIAGNOSIS — E1122 Type 2 diabetes mellitus with diabetic chronic kidney disease: Secondary | ICD-10-CM | POA: Diagnosis not present

## 2022-06-02 HISTORY — PX: ESOPHAGEAL DILATION: SHX303

## 2022-06-02 HISTORY — PX: HEMOSTASIS CLIP PLACEMENT: SHX6857

## 2022-06-02 HISTORY — PX: HOT HEMOSTASIS: SHX5433

## 2022-06-02 HISTORY — PX: ESOPHAGOGASTRODUODENOSCOPY (EGD) WITH PROPOFOL: SHX5813

## 2022-06-02 LAB — GLUCOSE, CAPILLARY
Glucose-Capillary: 150 mg/dL — ABNORMAL HIGH (ref 70–99)
Glucose-Capillary: 150 mg/dL — ABNORMAL HIGH (ref 70–99)
Glucose-Capillary: 289 mg/dL — ABNORMAL HIGH (ref 70–99)
Glucose-Capillary: 167 mg/dL — ABNORMAL HIGH (ref 70–99)

## 2022-06-02 LAB — CBC WITH DIFFERENTIAL/PLATELET
Abs Immature Granulocytes: 0.05 10*3/uL (ref 0.00–0.07)
Basophils Absolute: 0 10*3/uL (ref 0.0–0.1)
Basophils Relative: 0 %
Eosinophils Absolute: 0.3 10*3/uL (ref 0.0–0.5)
Eosinophils Relative: 3 %
HCT: 27.7 % — ABNORMAL LOW (ref 36.0–46.0)
Hemoglobin: 8.8 g/dL — ABNORMAL LOW (ref 12.0–15.0)
Immature Granulocytes: 1 %
Lymphocytes Relative: 9 %
Lymphs Abs: 1 10*3/uL (ref 0.7–4.0)
MCH: 28 pg (ref 26.0–34.0)
MCHC: 31.8 g/dL (ref 30.0–36.0)
MCV: 88.2 fL (ref 80.0–100.0)
Monocytes Absolute: 0.9 10*3/uL (ref 0.1–1.0)
Monocytes Relative: 9 %
Neutro Abs: 8.2 10*3/uL — ABNORMAL HIGH (ref 1.7–7.7)
Neutrophils Relative %: 78 %
Platelets: 181 10*3/uL (ref 150–400)
RBC: 3.14 MIL/uL — ABNORMAL LOW (ref 3.87–5.11)
RDW: 17.2 % — ABNORMAL HIGH (ref 11.5–15.5)
WBC: 10.5 10*3/uL (ref 4.0–10.5)
nRBC: 0 % (ref 0.0–0.2)

## 2022-06-02 LAB — BASIC METABOLIC PANEL
Anion gap: 12 (ref 5–15)
BUN: 26 mg/dL — ABNORMAL HIGH (ref 8–23)
CO2: 28 mmol/L (ref 22–32)
Calcium: 8.6 mg/dL — ABNORMAL LOW (ref 8.9–10.3)
Chloride: 94 mmol/L — ABNORMAL LOW (ref 98–111)
Creatinine, Ser: 5.39 mg/dL — ABNORMAL HIGH (ref 0.44–1.00)
GFR, Estimated: 8 mL/min — ABNORMAL LOW (ref >60)
Glucose, Bld: 147 mg/dL — ABNORMAL HIGH (ref 70–99)
Potassium: 3.6 mmol/L (ref 3.5–5.1)
Sodium: 134 mmol/L — ABNORMAL LOW (ref 135–145)

## 2022-06-02 LAB — HEPATITIS B SURFACE ANTIBODY, QUANTITATIVE: Hep B S AB Quant (Post): 419.4 m[IU]/mL (ref >9.9)

## 2022-06-02 SURGERY — ESOPHAGOGASTRODUODENOSCOPY (EGD) WITH PROPOFOL
Anesthesia: Choice

## 2022-06-02 SURGERY — ESOPHAGOGASTRODUODENOSCOPY (EGD) WITH PROPOFOL
Anesthesia: General | Laterality: Right

## 2022-06-02 MED ORDER — GLUCAGON HCL RDNA (DIAGNOSTIC) 1 MG IJ SOLR
INTRAMUSCULAR | Status: DC | PRN
  Administered 2022-06-02: .5 mg via INTRAVENOUS

## 2022-06-02 MED ORDER — PROPOFOL 10 MG/ML IV BOLUS
INTRAVENOUS | Status: DC | PRN
  Administered 2022-06-02 (×3): 40 mg via INTRAVENOUS
  Administered 2022-06-02: 100 mg via INTRAVENOUS

## 2022-06-02 MED ORDER — PHENYLEPHRINE HCL (PRESSORS) 10 MG/ML IV SOLN
INTRAVENOUS | Status: AC
  Filled 2022-06-02: qty 1

## 2022-06-02 MED ORDER — PHENYLEPHRINE HCL-NACL 20-0.9 MG/250ML-% IV SOLN
INTRAVENOUS | Status: DC | PRN
  Administered 2022-06-02: 60 ug/min via INTRAVENOUS

## 2022-06-02 MED ORDER — LIDOCAINE HCL (CARDIAC) PF 100 MG/5ML IV SOSY
PREFILLED_SYRINGE | INTRAVENOUS | Status: DC | PRN
  Administered 2022-06-02: 50 mg via INTRAVENOUS

## 2022-06-02 MED ORDER — GLUCAGON HCL RDNA (DIAGNOSTIC) 1 MG IJ SOLR
INTRAMUSCULAR | Status: AC
  Filled 2022-06-02: qty 1

## 2022-06-02 MED ORDER — PROPOFOL 10 MG/ML IV BOLUS
INTRAVENOUS | Status: AC
  Filled 2022-06-02: qty 20

## 2022-06-02 MED ORDER — CHLORHEXIDINE GLUCONATE CLOTH 2 % EX PADS
6.0000 | MEDICATED_PAD | Freq: Every day | CUTANEOUS | Status: DC
Start: 2022-06-02 — End: 2022-06-04
  Administered 2022-06-02 – 2022-06-03 (×2): 6 via TOPICAL

## 2022-06-02 MED ORDER — PROPOFOL 500 MG/50ML IV EMUL
INTRAVENOUS | Status: DC | PRN
  Administered 2022-06-02: 100 ug/kg/min via INTRAVENOUS

## 2022-06-02 MED ORDER — PHENYLEPHRINE 80 MCG/ML (10ML) SYRINGE FOR IV PUSH (FOR BLOOD PRESSURE SUPPORT)
PREFILLED_SYRINGE | INTRAVENOUS | Status: DC | PRN
  Administered 2022-06-02: 160 ug via INTRAVENOUS
  Administered 2022-06-02 (×2): 80 ug via INTRAVENOUS

## 2022-06-02 MED ORDER — SODIUM CHLORIDE 0.9 % IV SOLN: INTRAVENOUS | Status: DC

## 2022-06-02 MED ORDER — MELATONIN 3 MG PO TABS
3.0000 mg | ORAL_TABLET | Freq: Every day | ORAL | Status: DC
  Administered 2022-06-02 – 2022-06-03 (×2): 3 mg via ORAL
  Filled 2022-06-02 (×2): qty 1

## 2022-06-02 NOTE — Brief Op Note (Signed)
05/30/2022 - 06/02/2022  11:42 AM  PATIENT:  [name]  71 y.o. 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  Tolerated the procedure adequately.  The examined esophagus was normal. The entire examined stomach was normal. 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  Coagulation for hemostasis using argon beam at 0.3 liters/minute and 20 watts was successful.  To prevent bleeding post-intervention, two hemostatic clips were successfully placed, one in each ablation site.  There was no bleeding at the end of the procedure.   RECOMMENDATIONS - Return patient to hospital ward for ongoing care.  - Resume previous diet.  - Check H/H daily - Continue pantoprazole 40 mg twice a day - Continue Brillinta - If hemoglobin stable tomorrow, can consider restart systemic AC.  Maylon Peppers, MD Gastroenterology and Hepatology [facility]

## 2022-06-02 NOTE — Progress Notes (Signed)
[facility]                                                                               [name], is a 71 y.o. female, DOB - Jun 13, 1951, QQP:[id number] Admit date - 05/30/2022    Outpatient Primary MD for the patient is Tamara Fraise, MD  LOS - 3  days    Brief summary   [name] is a 71 y.o. female with medical history significant of CHF with grade 2 diastolic dysfunction, anemia, coronary artery disease, end-stage renal disease with dialysis Tuesday Thursday Saturday, COPD, diabetic neuropathy, GERD, hyperlipidemia, hypertension, insulin-dependent diabetes, stroke, peripheral vascular disease, and more presents ED with a chief complaint of chest pain.    Assessment & Plan   Symptomatic anemia int he setting of anemia of chronic disease. Rule out GI bleed.  -s/p 1 unit of prbc transfusion.  - transfuse to keep hemoglobin greater 7. Hemoglobin this am is around 8.8. - no bloody bowel movements.  - Gi consulted and plan for EGD today.  - Eliquis on hold till we rule out upper GI bleed.  - continue with PPI IV BID.    ESRD on HD.  Nephrology on board.    Hypokalemia:  Normalized.    Type 2 DM: with diabetic nephropathy.  CBG (last 3)  Recent Labs    06/01/22 1613 06/01/22 2115 06/02/22 0705  GLUCAP 176* 265* 150*    Uncontrolled with hyperglycemia.  Currently on Semglee 15 units daily. No change in meds.  Continue with SSI.  Last hemoglobin A1c is 8. In April 2023.     Hypertension:  Optimal BP parameters.     Paroxysmal atrial fibrillation.  Rate controlled.  Continue with Diltiazem and atenolol.  Eliquis on hold    Constipation:  Resume senna.    Hyperlipidemia:  Continue with crestor.     GAD:  Continue with atarax.   CAD:  S/P DES to LAD and DES to RCA in 03/2022. Continue with Brillinta.  Continue with BB and statin.    Pulmonary Hypertension;  RV dysfunction:  Not a candidate for advanced therapies.     Nausea and vomiting yesterday evening ABD x ray is negative for obstruction.  Appears to  have resolved this am.     Estimated body mass index is 30.89 kg/m as calculated from the following:   Height as of this encounter: 5\' 3"  (1.6 m).   Weight as of this encounter: 79.1 kg.  Code Status: full code.  DVT Prophylaxis:  SCDs Start: 05/30/22 2214   Level of Care: Level of care: Telemetry Family Communication: none at bedside.   Disposition Plan:     Remains inpatient appropriate: possible discharge over the weekend pending EGD results.   Procedures:  EGD scheduled today.   Consultants:   Cardiology Nephrology.  Gastroenterology.   Antimicrobials:   Anti-infectives (From admission, onward)    None        Medications  Scheduled Meds:  [MAR Hold] atenolol  100 mg Oral QHS   [MAR Hold] calcitRIOL  2 mcg Oral Q T,Th,Sa-HD   [MAR Hold] calcium acetate  1,334 mg Oral TID with meals   [  MAR Hold] Chlorhexidine Gluconate Cloth  6 each Topical Q0600   [MAR Hold] Chlorhexidine Gluconate Cloth  6 each Topical Q0600   [MAR Hold] darbepoetin (ARANESP) injection - DIALYSIS  150 mcg Intravenous Q Thu-HD   [MAR Hold] diltiazem  240 mg Oral Daily   [MAR Hold] gabapentin  800 mg Oral Q T,Th,Sa-HD   [MAR Hold] insulin aspart  0-15 Units Subcutaneous TID WC   [MAR Hold] insulin aspart  0-5 Units Subcutaneous QHS   [MAR Hold] insulin detemir  15 Units Subcutaneous QHS   [MAR Hold] melatonin  3 mg Oral QHS   [MAR Hold] mometasone-formoterol  2 puff Inhalation BID   [MAR Hold] pantoprazole (PROTONIX) IV  40 mg Intravenous Q12H   [MAR Hold] QUEtiapine  200 mg Oral QHS   [MAR Hold] rOPINIRole  1 mg Oral QHS   [MAR Hold] rosuvastatin  5 mg Oral QHS   [MAR Hold] ticagrelor  90 mg Oral BID   Continuous Infusions:  [MAR Hold] sodium chloride     sodium chloride     sodium chloride     PRN Meds:.[MAR Hold] acetaminophen **OR** [MAR Hold] acetaminophen, [MAR Hold] albuterol, [MAR  Hold] calcium acetate, [MAR Hold] hydrOXYzine, [MAR Hold]  morphine injection, [MAR Hold] ondansetron **OR** [MAR Hold] ondansetron (ZOFRAN) IV, [MAR Hold] oxyCODONE    Subjective:   [name] was seen and examined today.  Hasn't had a good night.  Objective:   Vitals:   06/01/22 2116 06/02/22 0439 06/02/22 0730 06/02/22 1008  BP: 120/72 117/72  119/81  Pulse: 75 93  93  Resp: 19 19  16   Temp: 98.2 F (36.8 C) 97.9 F (36.6 C)  98 F (36.7 C)  TempSrc: Oral Oral  Oral  SpO2: 97% 100% 96% 94%  Weight:  79.1 kg  79.1 kg  Height:  5\' 3"  (1.6 m)  5\' 3"  (1.6 m)    Intake/Output Summary (Last 24 hours) at 06/02/2022 1059 Last data filed at 06/02/2022 0900 Gross per 24 hour  Intake 20 ml  Output 3000 ml  Net -2980 ml    Filed Weights   06/01/22 1049 06/02/22 0439 06/02/22 1008  Weight: 83.2 kg 79.1 kg 79.1 kg     Exam General exam: Appears calm and comfortable  Respiratory system: Clear to auscultation. Respiratory effort normal. Cardiovascular system: S1 & S2 heard, irregularly irregular,  No pedal edema. Gastrointestinal system: Abdomen is nondistended, soft and nontender.Normal bowel sounds heard. Central nervous system: Alert and oriented. No focal neurological deficits. Extremities: Symmetric 5 x 5 power. Skin: No rashes, lesions or ulcers Psychiatry:  Mood & affect appropriate.     Data Reviewed:  I have personally reviewed following labs and imaging studies   CBC Lab Results  Component Value Date   WBC 10.5 06/02/2022   RBC 3.14 (L) 06/02/2022   HGB 8.8 (L) 06/02/2022   HCT 27.7 (L) 06/02/2022   MCV 88.2 06/02/2022   MCH 28.0 06/02/2022   PLT 181 06/02/2022   MCHC 31.8 06/02/2022   RDW 17.2 (H) 06/02/2022   LYMPHSABS 1.0 06/02/2022   MONOABS 0.9 06/02/2022   EOSABS 0.3 06/02/2022   BASOSABS 0.0 66/44/0347     Last metabolic panel Lab Results  Component Value Date   NA 134 (L) 06/02/2022   K 3.6 06/02/2022   CL 94 (L) 06/02/2022   CO2 28  06/02/2022   BUN 26 (H) 06/02/2022   CREATININE 5.39 (H) 06/02/2022   GLUCOSE 147 (H) 06/02/2022   GFRNONAA  8 (L) 06/02/2022   GFRAA 4 (L) 11/01/2020   CALCIUM 8.6 (L) 06/02/2022   PHOS 3.4 06/01/2022   PROT 5.3 (L) 05/31/2022   ALBUMIN 2.7 (L) 06/01/2022   LABGLOB 1.9 03/17/2022   AGRATIO 2.1 03/17/2022   BILITOT 0.9 05/31/2022   ALKPHOS 63 05/31/2022   AST 23 05/31/2022   ALT 30 05/31/2022   ANIONGAP 12 06/02/2022    CBG (last 3)  Recent Labs    06/01/22 1613 06/01/22 2115 06/02/22 0705  GLUCAP 176* 265* 150*       Coagulation Profile: No results for input(s): "INR", "PROTIME" in the last 168 hours.   Radiology Studies: DG Abd 2 Views  Result Date: 06/01/2022 CLINICAL DATA:  Nausea and vomiting. EXAM: ABDOMEN - 2 VIEW COMPARISON:  Abdominal x-ray 07/11/2017 CTA abdomen and pelvis 10/01/2020. FINDINGS: The bowel gas pattern is normal. There is no evidence of free air. Prominent vascular calcifications are present in the abdomen and pelvis as well as lower extremities. Branching calcifications are seen in the region of the kidneys, likely vascular as seen on comparison CT. There are phleboliths in the pelvis. Degenerative changes affect the lower lumbar spine and hips. IMPRESSION: 1. Nonobstructive bowel gas pattern. 2. Severe atherosclerotic disease including peripheral vascular calcifications. Electronically Signed   By: Ronney Asters M.D.   On: 06/01/2022 18:18       Hosie Poisson M.D. [facility] 06/02/2022, 10:59 AM  Available via Epic secure chat 7am-7pm After 7 pm, please refer to night coverage provider listed on amion.

## 2022-06-02 NOTE — Care Management Important Message (Signed)
Important Message  Patient Details  Name: [name] MRN: [id number] Date of Birth: Feb 13, 1951   Medicare Important Message Given:  Yes     [name] 06/02/2022, 1:46 PM

## 2022-06-02 NOTE — Progress Notes (Addendum)
Progress Note  Patient Name: [name] Date of Encounter: 06/02/2022  Primary Cardiologist: Minus Breeding, MD   Subjective   Overnight no events. Planned for EGD today. Patient notes that she has has bleeding issues around her R AV fistula site after HD access.  Having worsening insomnia. Clarifies that her issues with plavix were related to bruising (prior noted ("Sick, Headache, Felt terrible" per MAR) No CP, SOB, Palpitations.  Inpatient Medications    Scheduled Meds:  atenolol  100 mg Oral QHS   calcitRIOL  2 mcg Oral Q T,Th,Sa-HD   calcium acetate  1,334 mg Oral TID with meals   Chlorhexidine Gluconate Cloth  6 each Topical Q0600   Chlorhexidine Gluconate Cloth  6 each Topical Q0600   darbepoetin (ARANESP) injection - DIALYSIS  150 mcg Intravenous Q Thu-HD   diltiazem  240 mg Oral Daily   gabapentin  800 mg Oral Q T,Th,Sa-HD   insulin aspart  0-15 Units Subcutaneous TID WC   insulin aspart  0-5 Units Subcutaneous QHS   insulin detemir  15 Units Subcutaneous QHS   melatonin  3 mg Oral QHS   mometasone-formoterol  2 puff Inhalation BID   pantoprazole (PROTONIX) IV  40 mg Intravenous Q12H   QUEtiapine  200 mg Oral QHS   rOPINIRole  1 mg Oral QHS   rosuvastatin  5 mg Oral QHS   ticagrelor  90 mg Oral BID   Continuous Infusions:  sodium chloride     PRN Meds: acetaminophen **OR** acetaminophen, albuterol, calcium acetate, hydrOXYzine, morphine injection, ondansetron **OR** ondansetron (ZOFRAN) IV, oxyCODONE   Vital Signs    Vitals:   06/01/22 1930 06/01/22 2116 06/02/22 0439 06/02/22 0730  BP:  120/72 117/72   Pulse:  75 93   Resp:  19 19   Temp:  98.2 F (36.8 C) 97.9 F (36.6 C)   TempSrc:  Oral Oral   SpO2: 97% 97% 100% 96%  Weight:   79.1 kg   Height:   5\' 3"  (1.6 m)     Intake/Output Summary (Last 24 hours) at 06/02/2022 9833 Last data filed at 06/01/2022 1700 Gross per 24 hour  Intake 260 ml  Output 3000 ml  Net -2740 ml   Filed  Weights   06/01/22 0326 06/01/22 1049 06/02/22 0439  Weight: 82.2 kg 83.2 kg 79.1 kg    Telemetry    AF rate average rate < 110 bpm - Personally Reviewed  Physical Exam   Gen: no distress Neck: No JVD Cardiac: No Rubs or Gallops, IRIR no murmur +2 radial pulses Respiratory: Clear to auscultation bilaterally, normal effort, normal  respiratory rate GI: Soft, nontender, non-distended  MS: +1 bilateral  edema;  moves all extremities Integument: Skin feels warm; her AV fistula site is heavily bandaged, have not unwrapped Neuro:  At time of evaluation, alert and oriented to person/place/time/situation  Psych: Normal affect, patient feels tired   Labs    Chemistry Recent Labs  Lab 05/31/22 0520 05/31/22 1700 06/01/22 2100 06/02/22 0518  NA 131*  --  134* 134*  K 2.8* 3.9 4.0 3.6  CL 92*  --  93* 94*  CO2 27  --  28 28  GLUCOSE 278*  --  268* 147*  BUN 28*  --  21 26*  CREATININE 5.89*  --  4.62* 5.39*  CALCIUM 7.9*  --  8.7* 8.6*  PROT 5.3*  --   --   --   ALBUMIN 2.4*  --  2.7*  --  AST 23  --   --   --   ALT 30  --   --   --   ALKPHOS 63  --   --   --   BILITOT 0.9  --   --   --   GFRNONAA 7*  --  10* 8*  ANIONGAP 12  --  13 12     Hematology Recent Labs  Lab 05/31/22 0520 06/01/22 0521 06/02/22 0518  WBC 8.9 11.1* 10.5  RBC 2.77* 3.29* 3.14*  HGB 7.7* 9.3* 8.8*  HCT 23.8* 28.3* 27.7*  MCV 85.9 86.0 88.2  MCH 27.8 28.3 28.0  MCHC 32.4 32.9 31.8  RDW 17.7* 16.8* 17.2*  PLT 149* 156 181    Cardiac EnzymesNo results for input(s): "TROPONINI" in the last 168 hours. No results for input(s): "TROPIPOC" in the last 168 hours.   BNPNo results for input(s): "BNP", "PROBNP" in the last 168 hours.   DDimer No results for input(s): "DDIMER" in the last 168 hours.   Radiology    DG Abd 2 Views  Result Date: 06/01/2022 CLINICAL DATA:  Nausea and vomiting. EXAM: ABDOMEN - 2 VIEW COMPARISON:  Abdominal x-ray 07/11/2017 CTA abdomen and pelvis 10/01/2020.  FINDINGS: The bowel gas pattern is normal. There is no evidence of free air. Prominent vascular calcifications are present in the abdomen and pelvis as well as lower extremities. Branching calcifications are seen in the region of the kidneys, likely vascular as seen on comparison CT. There are phleboliths in the pelvis. Degenerative changes affect the lower lumbar spine and hips. IMPRESSION: 1. Nonobstructive bowel gas pattern. 2. Severe atherosclerotic disease including peripheral vascular calcifications. Electronically Signed   By: Ronney Asters M.D.   On: 06/01/2022 18:18     Patient Profile     71 y.o. 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   Medication Recommendations:  continue BB  If cleared by GI for full therapy should would resume eliquis and complete eliquis and Diona Fanti (she is 5 days from 06/07/22 3 month recommendation from 03/08/22 Cath)  If cleared for eliquis, only would continue this  Otherwise will leave on only brilinta monotherapy until cleared for above  Other recommendations (labs, testing, etc):  CBC as per primary Follow up as an outpatient:  06/26/22 (EP), 07/12/22 (Cards)    For questions or updates, please contact [facility] Please consult [email or web address] for contact info  under Cardiology/STEMI.      Rudean Haskell, MD [address], [address] [place], [place] [address] [phone]  8:22 AM

## 2022-06-02 NOTE — Progress Notes (Signed)
Changed dressing on R upper arm fistula. Old dressing had a copious amount of dark red drainage. Re-covered with gauze, ABD pads, and Kerlix. Cap refill Less than 3 seconds and Radial pulses weak, bilaterally. Pt. Now sitting in recliner with call light in reach.

## 2022-06-02 NOTE — Progress Notes (Signed)
We will proceed with EGD as scheduled.  I thoroughly discussed with the patient his procedure, including the risks involved. Patient understands what the procedure involves including the benefits and any risks. Patient understands alternatives to the proposed procedure. Risks including (but not limited to) bleeding, tearing of the lining (perforation), rupture of adjacent organs, problems with heart and lung function, infection, and medication reactions. A small percentage of complications may require surgery, hospitalization, repeat endoscopic procedure, and/or transfusion.  Patient understood and agreed.  Mikayela Deats Castaneda, MD Gastroenterology and Hepatology [facility]  

## 2022-06-02 NOTE — Progress Notes (Signed)
PT Cancellation Note  Patient Details Name: [name] MRN: [id number] DOB: 1951/02/19   Cancelled Treatment:    Reason Eval/Treat Not Completed: Patient declined, no reason specified; Attempted to see patient this morning for PT but she was not wishing to participate due to fatigue. Will check back if time permits.  2:44 PM, 06/02/22 Mearl Latin PT, DPT Physical Therapist at [facility]

## 2022-06-02 NOTE — Anesthesia Procedure Notes (Signed)
Date/Time: 06/02/2022 11:07 AM  Performed by: Orlie Dakin, CRNAPre-anesthesia Checklist: Patient identified, Emergency Drugs available, Suction available and Patient being monitored Patient Re-evaluated:Patient Re-evaluated prior to induction Oxygen Delivery Method: Nasal cannula Induction Type: IV induction Placement Confirmation: positive ETCO2

## 2022-06-02 NOTE — Anesthesia Preprocedure Evaluation (Signed)
Anesthesia Evaluation  Patient identified by MRN, date of birth, ID band Patient awake    Reviewed: Allergy & Precautions, H&P , NPO status , Patient's Chart, lab work & pertinent test results, reviewed documented beta blocker date and time   Airway Mallampati: II  TM Distance: >3 FB Neck ROM: full    Dental no notable dental hx.    Pulmonary shortness of breath, COPD, former smoker,    Pulmonary exam normal breath sounds clear to auscultation       Cardiovascular Exercise Tolerance: Good hypertension, + angina + CAD, + Peripheral Vascular Disease and +CHF   Rhythm:regular Rate:Normal     Neuro/Psych PSYCHIATRIC DISORDERS Anxiety  Neuromuscular disease CVA    GI/Hepatic Neg liver ROS, GERD  Medicated,  Endo/Other  negative endocrine ROSdiabetes  Renal/GU ESRF and DialysisRenal disease  negative genitourinary   Musculoskeletal   Abdominal   Peds  Hematology  (+) Blood dyscrasia, anemia ,   Anesthesia Other Findings   Reproductive/Obstetrics negative OB ROS                             Anesthesia Physical  Anesthesia Plan  ASA: 3 and emergent  Anesthesia Plan: General   Post-op Pain Management:    Induction:   PONV Risk Score and Plan: Propofol infusion  Airway Management Planned:   Additional Equipment:   Intra-op Plan:   Post-operative Plan:   Informed Consent: I have reviewed the patients History and Physical, chart, labs and discussed the procedure including the risks, benefits and alternatives for the proposed anesthesia with the patient or authorized representative who has indicated his/her understanding and acceptance.     Dental Advisory Given  Plan Discussed with: CRNA  Anesthesia Plan Comments:         Anesthesia Quick Evaluation

## 2022-06-02 NOTE — Transfer of Care (Signed)
Immediate Anesthesia Transfer of Care Note  Patient: ATHENA BALTZ  Procedure(s) Performed: ESOPHAGOGASTRODUODENOSCOPY (EGD) WITH PROPOFOL ESOPHAGEAL DILATION (Right) HEMOSTASIS CLIP PLACEMENT HOT HEMOSTASIS (ARGON PLASMA COAGULATION/BICAP)  Patient Location: PACU  Anesthesia Type:General  Level of Consciousness: drowsy  Airway & Oxygen Therapy: Patient Spontanous Breathing and Patient connected to nasal cannula oxygen  Post-op Assessment: Report given to RN and Post -op Vital signs reviewed and stable  Post vital signs: Reviewed and stable  Last Vitals:  Vitals Value Taken Time  BP 135/66 06/02/22 1136  Temp    Pulse 116 06/02/22 1136  Resp 33 06/02/22 1136  SpO2 96 % 06/02/22 1134  Vitals shown include unvalidated device data.  Last Pain:  Vitals:   06/02/22 1059  TempSrc:   PainSc: 0-No pain         Complications: No notable events documented.

## 2022-06-02 NOTE — Progress Notes (Addendum)
Subjective:  HD yest-  remmoved 3 liters easily ( she wanted to limit to that amount) post weight 79.1 ( EDW 80)  said was not able to sleep last night - longstanding issue also had some post HD bleeding to her arm  Objective Vital signs in last 24 hours: Vitals:   06/01/22 1520 06/01/22 1930 06/01/22 2116 06/02/22 0439  BP: (!) 143/87  120/72 117/72  Pulse: 100  75 93  Resp: 19  19 19   Temp: 97.9 F (36.6 C)  98.2 F (36.8 C) 97.9 F (36.6 C)  TempSrc: Oral  Oral Oral  SpO2: 100% 97% 97% 100%  Weight:    79.1 kg  Height:    5\' 3"  (1.6 m)   Weight change: 0.963 kg  Intake/Output Summary (Last 24 hours) at 06/02/2022 9735 Last data filed at 06/01/2022 1700 Gross per 24 hour  Intake 260 ml  Output 3000 ml  Net -2740 ml    Dialysis Orders:    As of 5/26 Center: [facility]  on TTS . EDW 80 kg HD Bath 1K/2.5Ca  Time 4 hours Heparin 1000 units bolus then 1000 unts/hr. Access RUE AVF BFR 350 DFR 500    Calcitriol 2.25 mcg po/HD Micera 50 mcg IVP every 2 weeks     Assessment/Plan: 71 year old female with extensive past medical history including ESRD-  presents with CP, Afib and hgb in the 7's 1 acute on chronic anemia-  on eliquis which has been held for now but still on brilinta.  GI to do EGD to investigate for any source of bleeding maybe today ?-  no obvious sxms-  hgb bumped with transfusion but slightly lower this AM-  will continue with ESA-  dosed yesterday 150 2 ESRD: normally TTS at [facility] via AVF-  had full HD on Tuesday-  done yesterday on schedule.  I can run her on a high K bath to help correct her K-  she may not need to be run on a 1 K at an OP ? Plan on next HD tomorrow with UF as able 3 Hypertension: she seems overloaded to me-  on tenormin and cardizem-  got under EDW yesterday without BP drop-  will continue to challenge as able  4. Anemia of ESRD: last hgb as OP 8.8-  work up as above-  continue ESA 5. Metabolic Bone Disease: will continue home calcitriol and  phoslo  6. Hypokalemia-  runs on 1 K bath as OP-  may not need- ran on high K bath yesterday -  will likely again tomorrow as well  7. Insomnia-  will add melatonin   Patient will not be physically seen over the weekend.  Planning routine dialysis tomorrow.  If EGD negative and hemoglobin stable possibly could be appropriate for discharge over the weekend.  Call with any questions   Louis Meckel    Labs: Basic Metabolic Panel: Recent Labs  Lab 05/31/22 0520 05/31/22 1700 06/01/22 2100 06/02/22 0518  NA 131*  --  134* 134*  K 2.8* 3.9 4.0 3.6  CL 92*  --  93* 94*  CO2 27  --  28 28  GLUCOSE 278*  --  268* 147*  BUN 28*  --  21 26*  CREATININE 5.89*  --  4.62* 5.39*  CALCIUM 7.9*  --  8.7* 8.6*  PHOS  --   --  3.4  --    Liver Function Tests: Recent Labs  Lab 05/31/22 0520 06/01/22 2100  AST 23  --   ALT 30  --   ALKPHOS 63  --   BILITOT 0.9  --   PROT 5.3*  --   ALBUMIN 2.4* 2.7*   No results for input(s): "LIPASE", "AMYLASE" in the last 168 hours. No results for input(s): "AMMONIA" in the last 168 hours. CBC: Recent Labs  Lab 05/30/22 1803 05/31/22 0520 06/01/22 0521 06/02/22 0518  WBC 11.7* 8.9 11.1* 10.5  NEUTROABS  --  6.9  --  8.2*  HGB 7.6* 7.7* 9.3* 8.8*  HCT 23.8* 23.8* 28.3* 27.7*  MCV 87.5 85.9 86.0 88.2  PLT 178 149* 156 181   Cardiac Enzymes: No results for input(s): "CKTOTAL", "CKMB", "CKMBINDEX", "TROPONINI" in the last 168 hours. CBG: Recent Labs  Lab 05/31/22 1952 06/01/22 0737 06/01/22 1613 06/01/22 2115 06/02/22 0705  GLUCAP 250* 147* 176* 265* 150*    Iron Studies: No results for input(s): "IRON", "TIBC", "TRANSFERRIN", "FERRITIN" in the last 72 hours. Studies/Results: DG Abd 2 Views  Result Date: 06/01/2022 CLINICAL DATA:  Nausea and vomiting. EXAM: ABDOMEN - 2 VIEW COMPARISON:  Abdominal x-ray 07/11/2017 CTA abdomen and pelvis 10/01/2020. FINDINGS: The bowel gas pattern is normal. There is no evidence of free air.  Prominent vascular calcifications are present in the abdomen and pelvis as well as lower extremities. Branching calcifications are seen in the region of the kidneys, likely vascular as seen on comparison CT. There are phleboliths in the pelvis. Degenerative changes affect the lower lumbar spine and hips. IMPRESSION: 1. Nonobstructive bowel gas pattern. 2. Severe atherosclerotic disease including peripheral vascular calcifications. Electronically Signed   By: Ronney Asters M.D.   On: 06/01/2022 18:18   Medications: Infusions:  sodium chloride      Scheduled Medications:  atenolol  100 mg Oral QHS   calcitRIOL  2 mcg Oral Q T,Th,Sa-HD   calcium acetate  1,334 mg Oral TID with meals   Chlorhexidine Gluconate Cloth  6 each Topical Q0600   darbepoetin (ARANESP) injection - DIALYSIS  150 mcg Intravenous Q Thu-HD   diltiazem  240 mg Oral Daily   gabapentin  800 mg Oral Q T,Th,Sa-HD   insulin aspart  0-15 Units Subcutaneous TID WC   insulin aspart  0-5 Units Subcutaneous QHS   insulin detemir  15 Units Subcutaneous QHS   mometasone-formoterol  2 puff Inhalation BID   pantoprazole (PROTONIX) IV  40 mg Intravenous Q12H   QUEtiapine  200 mg Oral QHS   rOPINIRole  1 mg Oral QHS   rosuvastatin  5 mg Oral QHS   ticagrelor  90 mg Oral BID    have reviewed scheduled and prn medications.  Physical Exam: General: sitting up in chair-  obese, NAD Heart: RRR Lungs: mostly clear Abdomen: obese, soft, non tender Extremities: pitting edema Dialysis Access: right upper arm AVF with big dressing on at present     06/02/2022,7:12 AM  LOS: 3 days

## 2022-06-02 NOTE — Op Note (Signed)
[facility] Patient Name: [name] Procedure Date: 06/02/2022 10:22 AM MRN: [id number] Date of Birth: August 23, 1951 Attending MD: Maylon Peppers ,  CSN: [id number] Age: 72 Admit Type: Inpatient Procedure:                Upper GI endoscopy Indications:              Iron deficiency anemia Providers:                Maylon Peppers, Lambert Mody, Bonnetta Barry,                            Technician Referring MD:              Medicines:                Monitored Anesthesia Care Complications:            No immediate complications. Estimated Blood Loss:     Estimated blood loss: none. Procedure:                Pre-Anesthesia Assessment:                           - Prior to the procedure, a History and Physical                            was performed, and patient medications, allergies                            and sensitivities were reviewed. [name]                            tolerance of previous anesthesia was reviewed.                           - The risks and benefits of the procedure and the                            sedation options and risks were discussed with the                            patient. All questions were answered and informed                            consent was obtained.                           - ASA Grade Assessment: II - A patient with mild                            systemic disease.                           After obtaining informed consent, the endoscope was                            passed under direct vision. Throughout the  procedure, the patient's blood pressure, pulse, and                            oxygen saturations were monitored continuously. The                            [id number]-H190 ([id number]) scope was introduced through the                            mouth, and advanced to the second part of duodenum.                            The upper GI endoscopy was accomplished without                             difficulty. The patient tolerated the procedure                            well. Scope In: 11:08:47 AM Scope Out: 11:29:21 AM Total Procedure Duration: 0 hours 20 minutes 34 seconds  Findings:      The examined esophagus was normal.      The entire examined stomach was normal.      Two small angiodysplastic lesions with bleeding were found in the first       portion of the duodenum. Coagulation for hemostasis using argon beam at       0.3 liters/minute and 20 watts was successful. To prevent bleeding       post-intervention, two hemostatic clips were successfully placed, one in       each ablation site. There was no bleeding at the end of the procedure. Impression:               - Normal esophagus.                           - Normal stomach.                           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 Treated with argon beam coagulation.                            Clips were placed.                           - No specimens collected. Moderate Sedation:      Per Anesthesia Care Recommendation:           - Return patient to hospital ward for ongoing care.                           - Resume previous diet.                           - Check H/H daily                           -  Continue pantoprazole 40 mg twice a day                           - Continue Brillinta                           - If hemoglobin stable tomorrow, can consider                            restart systemic AC. Procedure Code(s):        --- Professional ---                           [phone], Esophagogastroduodenoscopy, flexible,                            transoral; with control of bleeding, any method Diagnosis Code(s):        --- Professional ---                           U04.540, Angiodysplasia of stomach and duodenum                            with bleeding                           D50.9, Iron deficiency anemia, unspecified CPT copyright 2019 American Medical  Association. All rights reserved. The codes documented in this report are preliminary and upon coder review may  be revised to meet current compliance requirements. Maylon Peppers, MD Maylon Peppers,  06/02/2022 11:44:21 AM This report has been signed electronically. Number of Addenda: 0

## 2022-06-03 DIAGNOSIS — E1159 Type 2 diabetes mellitus with other circulatory complications: Secondary | ICD-10-CM | POA: Diagnosis not present

## 2022-06-03 DIAGNOSIS — E1122 Type 2 diabetes mellitus with diabetic chronic kidney disease: Secondary | ICD-10-CM | POA: Diagnosis not present

## 2022-06-03 DIAGNOSIS — D649 Anemia, unspecified: Secondary | ICD-10-CM | POA: Diagnosis not present

## 2022-06-03 DIAGNOSIS — I4819 Other persistent atrial fibrillation: Secondary | ICD-10-CM | POA: Diagnosis not present

## 2022-06-03 LAB — BASIC METABOLIC PANEL
Anion gap: 11 (ref 5–15)
BUN: 35 mg/dL — ABNORMAL HIGH (ref 8–23)
CO2: 27 mmol/L (ref 22–32)
Calcium: 9.2 mg/dL (ref 8.9–10.3)
Chloride: 96 mmol/L — ABNORMAL LOW (ref 98–111)
Creatinine, Ser: 7.4 mg/dL — ABNORMAL HIGH (ref 0.44–1.00)
GFR, Estimated: 6 mL/min — ABNORMAL LOW (ref >60)
Glucose, Bld: 101 mg/dL — ABNORMAL HIGH (ref 70–99)
Potassium: 3.8 mmol/L (ref 3.5–5.1)
Sodium: 134 mmol/L — ABNORMAL LOW (ref 135–145)

## 2022-06-03 LAB — GLUCOSE, CAPILLARY
Glucose-Capillary: 104 mg/dL — ABNORMAL HIGH (ref 70–99)
Glucose-Capillary: 254 mg/dL — ABNORMAL HIGH (ref 70–99)
Glucose-Capillary: 124 mg/dL — ABNORMAL HIGH (ref 70–99)

## 2022-06-03 LAB — CBC WITH DIFFERENTIAL/PLATELET
Abs Immature Granulocytes: 0.05 10*3/uL (ref 0.00–0.07)
Basophils Absolute: 0 10*3/uL (ref 0.0–0.1)
Basophils Relative: 0 %
Eosinophils Absolute: 0.5 10*3/uL (ref 0.0–0.5)
Eosinophils Relative: 5 %
HCT: 30.1 % — ABNORMAL LOW (ref 36.0–46.0)
Hemoglobin: 9.6 g/dL — ABNORMAL LOW (ref 12.0–15.0)
Immature Granulocytes: 1 %
Lymphocytes Relative: 8 %
Lymphs Abs: 0.9 10*3/uL (ref 0.7–4.0)
MCH: 28.1 pg (ref 26.0–34.0)
MCHC: 31.9 g/dL (ref 30.0–36.0)
MCV: 88 fL (ref 80.0–100.0)
Monocytes Absolute: 0.9 10*3/uL (ref 0.1–1.0)
Monocytes Relative: 9 %
Neutro Abs: 8.5 10*3/uL — ABNORMAL HIGH (ref 1.7–7.7)
Neutrophils Relative %: 77 %
Platelets: 211 10*3/uL (ref 150–400)
RBC: 3.42 MIL/uL — ABNORMAL LOW (ref 3.87–5.11)
RDW: 17 % — ABNORMAL HIGH (ref 11.5–15.5)
WBC: 10.9 10*3/uL — ABNORMAL HIGH (ref 4.0–10.5)
nRBC: 0 % (ref 0.0–0.2)

## 2022-06-03 MED ORDER — APIXABAN 5 MG PO TABS
5.0000 mg | ORAL_TABLET | Freq: Two times a day (BID) | ORAL | Status: DC
  Administered 2022-06-03 – 2022-06-04 (×2): 5 mg via ORAL
  Filled 2022-06-03 (×2): qty 1

## 2022-06-03 MED ORDER — PANTOPRAZOLE SODIUM 40 MG PO TBEC
40.0000 mg | DELAYED_RELEASE_TABLET | Freq: Two times a day (BID) | ORAL | Status: DC
  Administered 2022-06-03 – 2022-06-04 (×2): 40 mg via ORAL
  Filled 2022-06-03 (×2): qty 1

## 2022-06-03 NOTE — Progress Notes (Signed)
[facility]                                                                               [name], is a 71 y.o. female, DOB - [birth year], TXM:[id number] Admit date - 05/30/2022    Outpatient Primary MD for the patient is [name], MD  LOS - 4  days    Brief summary   [name] is a 71 y.o. female with medical history significant of CHF with grade 2 diastolic dysfunction, anemia, coronary artery disease, end-stage renal disease with dialysis Tuesday Thursday Saturday, COPD, diabetic neuropathy, GERD, hyperlipidemia, hypertension, insulin-dependent diabetes, stroke, peripheral vascular disease, and more presents ED with a chief complaint of chest pain.    Assessment & Plan   Symptomatic anemia int he setting of anemia of chronic disease. Rule out GI bleed.  -s/p 1 unit of prbc transfusion.  - transfuse to keep hemoglobin greater 7. Hemoglobin improved to 9.  - no bloody bowel movements.  - Gi consulted and she underwent EGD showing Normal esophagus. Normal stomach. 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 Treated with argon beam coagulation. Clips were placed.  - continue with PPI bid.  - Resume eliquis today.    ESRD on HD.  Nephrology on board.    Hypokalemia:  Normalized.    Type 2 DM: with diabetic nephropathy.  CBG (last 3)  Recent Labs    06/02/22 1606 06/02/22 2057 06/03/22 0708  GLUCAP 289* 167* 104*    Uncontrolled with hyperglycemia.  Currently on Semglee 15 units daily. No change in meds.  Continue with SSI.  Last hemoglobin A1c is 8. In April 2023.     Hypertension:  Well controlled BP parameters.     Paroxysmal atrial fibrillation.  Rate controlled.  Continue with Diltiazem and atenolol.  Resume eliquis today.    Constipation:  Resume senna.    Hyperlipidemia:  Continue with crestor.     GAD:  Continue with atarax.   CAD:  S/P DES to LAD and DES to RCA in 03/2022. Continue with  Brillinta.  Continue with BB and statin.    Pulmonary Hypertension;  RV dysfunction:  Not a candidate for advanced therapies.    Nausea and vomiting yesterday evening ABD x ray is negative for obstruction.  Appears to  have resolved .    Estimated body mass index is 31.52 kg/m as calculated from the following:   Height as of this encounter: 5\' 3"  (1.6 m).   Weight as of this encounter: 80.7 kg.  Code Status: full code.  DVT Prophylaxis:  SCDs Start: 05/30/22 2214   Level of Care: Level of care: Telemetry Family Communication: none at bedside.   Disposition Plan:     Remains inpatient appropriate: possible discharge over the weekend pending EGD results.   Procedures:  EGD scheduled today.   Consultants:   Cardiology Nephrology.  Gastroenterology.   Antimicrobials:   Anti-infectives (From admission, onward)    None        Medications  Scheduled Meds:  atenolol  100 mg Oral QHS   calcitRIOL  2 mcg Oral Q T,Th,Sa-HD   calcium acetate  1,334 mg Oral  TID with meals   Chlorhexidine Gluconate Cloth  6 each Topical Q0600   darbepoetin (ARANESP) injection - DIALYSIS  150 mcg Intravenous Q Thu-HD   diltiazem  240 mg Oral Daily   gabapentin  800 mg Oral Q T,Th,Sa-HD   insulin aspart  0-15 Units Subcutaneous TID WC   insulin aspart  0-5 Units Subcutaneous QHS   insulin detemir  15 Units Subcutaneous QHS   melatonin  3 mg Oral QHS   mometasone-formoterol  2 puff Inhalation BID   pantoprazole (PROTONIX) IV  40 mg Intravenous Q12H   QUEtiapine  200 mg Oral QHS   rOPINIRole  1 mg Oral QHS   rosuvastatin  5 mg Oral QHS   ticagrelor  90 mg Oral BID   Continuous Infusions:  sodium chloride     PRN Meds:.acetaminophen **OR** acetaminophen, albuterol, calcium acetate, hydrOXYzine, morphine injection, ondansetron **OR** ondansetron (ZOFRAN) IV, oxyCODONE    Subjective:   [name] was seen and examined today.  Feeling better, no bloody bowel movements. No  chest pain or sob. No nausea or vomiting.  Objective:   Vitals:   06/03/22 1330 06/03/22 1400 06/03/22 1415 06/03/22 1430  BP: 123/71 129/87 (!) 146/69 135/63  Pulse: (!) 47 72    Resp: 15 15 (!) 21 (!) 21  Temp:    98.6 F (37 C)  TempSrc:    Oral  SpO2: 92% 95%    Weight:      Height:        Intake/Output Summary (Last 24 hours) at 06/03/2022 1512 Last data filed at 06/03/2022 1348 Gross per 24 hour  Intake 420 ml  Output 3000 ml  Net -2580 ml    Filed Weights   06/02/22 1008 06/03/22 0510 06/03/22 0918  Weight: 79.1 kg 80.4 kg 80.7 kg     Exam General exam: Appears calm and comfortable  Respiratory system: Clear to auscultation. Respiratory effort normal. Cardiovascular system: S1 & S2 heard, RRR. No JVD, No pedal edema. Gastrointestinal system: Abdomen is nondistended, soft and nontender.Normal bowel sounds heard. Central nervous system: Alert and oriented. No focal neurological deficits. Extremities: Symmetric 5 x 5 power. Skin: No rashes, lesions or ulcers Psychiatry:  Mood & affect appropriate.      Data Reviewed:  I have personally reviewed following labs and imaging studies   CBC Lab Results  Component Value Date   WBC 10.9 (H) 06/03/2022   RBC 3.42 (L) 06/03/2022   HGB 9.6 (L) 06/03/2022   HCT 30.1 (L) 06/03/2022   MCV 88.0 06/03/2022   MCH 28.1 06/03/2022   PLT 211 06/03/2022   MCHC 31.9 06/03/2022   RDW 17.0 (H) 06/03/2022   LYMPHSABS 0.9 06/03/2022   MONOABS 0.9 06/03/2022   EOSABS 0.5 06/03/2022   BASOSABS 0.0 27/02/5008     Last metabolic panel Lab Results  Component Value Date   NA 134 (L) 06/03/2022   K 3.8 06/03/2022   CL 96 (L) 06/03/2022   CO2 27 06/03/2022   BUN 35 (H) 06/03/2022   CREATININE 7.40 (H) 06/03/2022   GLUCOSE 101 (H) 06/03/2022   GFRNONAA 6 (L) 06/03/2022   GFRAA 4 (L) 11/01/2020   CALCIUM 9.2 06/03/2022   PHOS 3.4 06/01/2022   PROT 5.3 (L) 05/31/2022   ALBUMIN 2.7 (L) 06/01/2022   LABGLOB 1.9 03/17/2022    AGRATIO 2.1 03/17/2022   BILITOT 0.9 05/31/2022   ALKPHOS 63 05/31/2022   AST 23 05/31/2022   ALT 30 05/31/2022   ANIONGAP 11 06/03/2022  CBG (last 3)  Recent Labs    06/02/22 1606 06/02/22 2057 06/03/22 0708  GLUCAP 289* 167* 104*       Coagulation Profile: No results for input(s): "INR", "PROTIME" in the last 168 hours.   Radiology Studies: DG Abd 2 Views  Result Date: 06/01/2022 CLINICAL DATA:  Nausea and vomiting. EXAM: ABDOMEN - 2 VIEW COMPARISON:  Abdominal x-ray 07/11/2017 CTA abdomen and pelvis 10/01/2020. FINDINGS: The bowel gas pattern is normal. There is no evidence of free air. Prominent vascular calcifications are present in the abdomen and pelvis as well as lower extremities. Branching calcifications are seen in the region of the kidneys, likely vascular as seen on comparison CT. There are phleboliths in the pelvis. Degenerative changes affect the lower lumbar spine and hips. IMPRESSION: 1. Nonobstructive bowel gas pattern. 2. Severe atherosclerotic disease including peripheral vascular calcifications. Electronically Signed   By: Ronney Asters M.D.   On: 06/01/2022 18:18       Hosie Poisson M.D. [facility] 06/03/2022, 3:12 PM  Available via Epic secure chat 7am-7pm After 7 pm, please refer to night coverage provider listed on amion.

## 2022-06-03 NOTE — Progress Notes (Signed)
Notified Dr. Karleen Hampshire that patient's heart rate was afb bouncing between 100-130s. She said to continue to monitor since patient was asymptomatic .

## 2022-06-03 NOTE — Anesthesia Postprocedure Evaluation (Signed)
Anesthesia Post Note  Patient: [name]  Procedure(s) Performed: ESOPHAGOGASTRODUODENOSCOPY (EGD) WITH PROPOFOL ESOPHAGEAL DILATION (Right) HEMOSTASIS CLIP PLACEMENT HOT HEMOSTASIS (ARGON PLASMA COAGULATION/BICAP)  Patient location during evaluation: Phase II Anesthesia Type: General Level of consciousness: awake Pain management: pain level controlled Vital Signs Assessment: post-procedure vital signs reviewed and stable Respiratory status: spontaneous breathing and respiratory function stable Cardiovascular status: blood pressure returned to baseline and stable Postop Assessment: no headache and no apparent nausea or vomiting Anesthetic complications: no Comments: Late entry   No notable events documented.   Last Vitals:  Vitals:   06/03/22 1115 06/03/22 1130  BP: 132/77 (!) 153/82  Pulse: 78 77  Resp: 17 19  Temp:    SpO2: 97% 100%    Last Pain:  Vitals:   06/03/22 0913  TempSrc: Oral  PainSc:                  Louann Sjogren

## 2022-06-03 NOTE — Progress Notes (Signed)
Subjective: Patient seen in dialysis.  No complaints for me today.  No melena hematochezia.  Hemoglobin stable.  Objective: Vital signs in last 24 hours: Temp:  [97.1 F (36.2 C)-98.6 F (37 C)] 98.1 F (36.7 C) (07/01 0913) Pulse Rate:  [49-118] 91 (07/01 1230) Resp:  [14-30] 23 (07/01 1230) BP: (121-155)/(58-82) 125/58 (07/01 1230) SpO2:  [90 %-100 %] 98 % (07/01 1230) Weight:  [80.4 kg-80.7 kg] 80.7 kg (07/01 0918) Last BM Date : 06/01/22 General:   Alert and oriented, pleasant Head:  Normocephalic and atraumatic. Eyes:  No icterus, sclera clear. Conjuctiva pink.  Msk:  Symmetrical without gross deformities. Normal posture. Neurologic:  Alert and  oriented x4;  grossly normal neurologically. Skin:  Warm and dry, intact without significant lesions.  Cervical Nodes:  No significant cervical adenopathy. Psych:  Alert and cooperative. Normal mood and affect.  Intake/Output from previous day: 06/30 0701 - 07/01 0700 In: 980 [P.O.:480; I.V.:500] Out: -  Intake/Output this shift: Total I/O In: 180 [P.O.:180] Out: -   Lab Results: Recent Labs    06/01/22 0521 06/02/22 0518 06/03/22 0645  WBC 11.1* 10.5 10.9*  HGB 9.3* 8.8* 9.6*  HCT 28.3* 27.7* 30.1*  PLT 156 181 211   BMET Recent Labs    06/01/22 2100 06/02/22 0518 06/03/22 0645  NA 134* 134* 134*  K 4.0 3.6 3.8  CL 93* 94* 96*  CO2 28 28 27   GLUCOSE 268* 147* 101*  BUN 21 26* 35*  CREATININE 4.62* 5.39* 7.40*  CALCIUM 8.7* 8.6* 9.2   LFT Recent Labs    06/01/22 2100  ALBUMIN 2.7*   PT/INR No results for input(s): "LABPROT", "INR" in the last 72 hours. Hepatitis Panel Recent Labs    06/01/22 0519  HCVAB NON REACTIVE     Studies/Results: DG Abd 2 Views  Result Date: 06/01/2022 CLINICAL DATA:  Nausea and vomiting. EXAM: ABDOMEN - 2 VIEW COMPARISON:  Abdominal x-ray 07/11/2017 CTA abdomen and pelvis 10/01/2020. FINDINGS: The bowel gas pattern is normal. There is no evidence of free air. Prominent  vascular calcifications are present in the abdomen and pelvis as well as lower extremities. Branching calcifications are seen in the region of the kidneys, likely vascular as seen on comparison CT. There are phleboliths in the pelvis. Degenerative changes affect the lower lumbar spine and hips. IMPRESSION: 1. Nonobstructive bowel gas pattern. 2. Severe atherosclerotic disease including peripheral vascular calcifications. Electronically Signed   By: Ronney Asters M.D.   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  No overt GI bleeding.  Status post EGD 06/02/2022 with APC of 2 duodenal AVMs with subsequent clip placement.  Continue on twice daily PPI.  Okay to resume Eliquis today.  Patient remains high risk for bleeding especially given her recent coronary stent and need for Brilinta.  If patient needs MRI for any reason in the next 2 to 3 months, she will need KUB prior given her hemostatic clip placement.  Outpatient follow-up with GI in 4 to 6 weeks.  We will sign off.  Please call with questions or concerns.  Elon Alas. Abbey Chatters, D.O. Gastroenterology and Hepatology [facility]   LOS: 4 days    06/03/2022, 12:46 PM

## 2022-06-03 NOTE — Procedures (Signed)
   HEMODIALYSIS TREATMENT NOTE:   Uneventful 4 hour heparin-free treatment completed using right upper arm AVF (15g/antegrade). Goal met: 3 liters removed without interruption in UF.  All blood was returned.  No changes from pre-HD assessment.   Rockwell Alexandria, RN

## 2022-06-03 NOTE — Plan of Care (Signed)
  Problem: Education: Goal: Knowledge of General Education information will improve Description Including pain rating scale, medication(s)/side effects and non-pharmacologic comfort measures Outcome: Progressing   Problem: Health Behavior/Discharge Planning: Goal: Ability to manage health-related needs will improve Outcome: Progressing   

## 2022-06-04 ENCOUNTER — Other Ambulatory Visit: Payer: Self-pay

## 2022-06-04 DIAGNOSIS — D649 Anemia, unspecified: Secondary | ICD-10-CM | POA: Diagnosis not present

## 2022-06-04 LAB — GLUCOSE, CAPILLARY
Glucose-Capillary: 114 mg/dL — ABNORMAL HIGH (ref 70–99)
Glucose-Capillary: 101 mg/dL — ABNORMAL HIGH (ref 70–99)
Glucose-Capillary: 64 mg/dL — ABNORMAL LOW (ref 70–99)
Glucose-Capillary: 204 mg/dL — ABNORMAL HIGH (ref 70–99)

## 2022-06-04 LAB — MAGNESIUM: Magnesium: 1.8 mg/dL (ref 1.7–2.4)

## 2022-06-04 MED ORDER — TICAGRELOR 90 MG PO TABS: 90.0000 mg | ORAL_TABLET | Freq: Two times a day (BID) | ORAL | 0 refills | Status: DC

## 2022-06-04 MED ORDER — PANTOPRAZOLE SODIUM 40 MG PO TBEC: 40.0000 mg | DELAYED_RELEASE_TABLET | Freq: Two times a day (BID) | ORAL | 0 refills | Status: DC

## 2022-06-04 NOTE — Plan of Care (Signed)
  Problem: Education: Goal: Knowledge of General Education information will improve Description Including pain rating scale, medication(s)/side effects and non-pharmacologic comfort measures Outcome: Progressing   Problem: Health Behavior/Discharge Planning: Goal: Ability to manage health-related needs will improve Outcome: Progressing   

## 2022-06-04 NOTE — Plan of Care (Signed)
  Problem: Education: Goal: Knowledge of General Education information will improve Description: Including pain rating scale, medication(s)/side effects and non-pharmacologic comfort measures 06/04/2022 1047 by Santa Lighter, RN Outcome: Adequate for Discharge 06/04/2022 1047 by Santa Lighter, RN Outcome: Progressing   Problem: Health Behavior/Discharge Planning: Goal: Ability to manage health-related needs will improve 06/04/2022 1047 by Santa Lighter, RN Outcome: Adequate for Discharge 06/04/2022 1047 by Santa Lighter, RN Outcome: Progressing   Problem: Clinical Measurements: Goal: Ability to maintain clinical measurements within normal limits will improve Outcome: Adequate for Discharge Goal: Will remain free from infection Outcome: Adequate for Discharge Goal: Diagnostic test results will improve Outcome: Adequate for Discharge Goal: Respiratory complications will improve Outcome: Adequate for Discharge Goal: Cardiovascular complication will be avoided Outcome: Adequate for Discharge   Problem: Activity: Goal: Risk for activity intolerance will decrease Outcome: Adequate for Discharge   Problem: Nutrition: Goal: Adequate nutrition will be maintained Outcome: Adequate for Discharge   Problem: Coping: Goal: Level of anxiety will decrease Outcome: Adequate for Discharge   Problem: Elimination: Goal: Will not experience complications related to bowel motility Outcome: Adequate for Discharge Goal: Will not experience complications related to urinary retention Outcome: Adequate for Discharge   Problem: Pain Managment: Goal: General experience of comfort will improve Outcome: Adequate for Discharge   Problem: Safety: Goal: Ability to remain free from injury will improve Outcome: Adequate for Discharge   Problem: Skin Integrity: Goal: Risk for impaired skin integrity will decrease Outcome: Adequate for Discharge   Problem: Acute Rehab PT Goals(only PT should  resolve) Goal: Pt Will Go Supine/Side To Sit Outcome: Adequate for Discharge Goal: Patient Will Transfer Sit To/From Stand Outcome: Adequate for Discharge Goal: Pt Will Transfer Bed To Chair/Chair To Bed Outcome: Adequate for Discharge Goal: Pt Will Ambulate Outcome: Adequate for Discharge

## 2022-06-04 NOTE — Progress Notes (Signed)
Pt's monitor alarming. Pt had 9 beat run of VTach and is AFib. This RN assesses patient to find her diaphoretic (entire bed wet). She reports that she did feel episode of palpitations and little more weakness than earlier. Initially denies chest pain or tightness but then reports that the chest pain and tightness is the same as it has been all day. VS obtained and all symptoms were reported to [facility] and he advises EKG. Bryson Corona Edd Fabian

## 2022-06-04 NOTE — Progress Notes (Signed)
SATURATION QUALIFICATIONS: (This note is used to comply with regulatory documentation for home oxygen)  Patient Saturations on Room Air at Rest  98%  Patient Saturations on Room Air while Ambulating = 93%  Patient Saturations on n/a Liters of oxygen while Ambulating = n/a%  Please briefly explain why patient needs home oxygen:

## 2022-06-04 NOTE — Progress Notes (Signed)
Nsg Discharge Note  Admit Date:  05/30/2022 Discharge date: 06/04/2022   [name] to be D/C'd Home per MD order.  AVS completed.   Removed IV-CDI. Reviewed d/c paperwork with patient and daughter. Answered all questions. Wheeled stable patient and belongings to main entrance where she was picked up by her daughter to d/c to home. Patient/caregiver able to verbalize understanding.  Discharge Medication: Allergies as of 06/04/2022       Reactions   Azithromycin    Prolonged QT   Ace Inhibitors Cough   Clopidogrel Bisulfate Other (See Comments)   Sick, Headache, "Felt terrible"   Codeine Other (See Comments)   hallucinations   Lisinopril Cough   Penicillins Other (See Comments)   Unknown childhood reaction Did it involve swelling of the face/tongue/throat, SOB, or low BP? Unknown Did it involve sudden or severe rash/hives, skin peeling, or any reaction on the inside of your mouth or nose? Unknown Did you need to seek medical attention at a hospital or doctor's office? Unknown When did it last happen?      childhood allergy If all above answers are "NO", may proceed with cephalosporin use.        Medication List     STOP taking these medications    aspirin EC 81 MG tablet   predniSONE 10 MG tablet Commonly known as: DELTASONE       TAKE these medications    acetaminophen 500 MG tablet Commonly known as: TYLENOL Take 1,000 mg by mouth every 6 (six) hours as needed for moderate pain or headache.   albuterol 108 (90 Base) MCG/ACT inhaler Commonly known as: VENTOLIN HFA Inhale 2 puffs into the lungs every 6 (six) hours as needed for wheezing or shortness of breath.   albuterol (2.5 MG/3ML) 0.083% nebulizer solution Commonly known as: PROVENTIL Take 3 mLs (2.5 mg total) by nebulization every 6 (six) hours as needed for wheezing or shortness of breath.   apixaban 5 MG Tabs tablet Commonly known as: ELIQUIS Take 1 tablet (5 mg total) by mouth 2 (two) times daily.    atenolol 100 MG tablet Commonly known as: TENORMIN Take 1 tablet (100 mg total) by mouth at bedtime.   budesonide-formoterol 160-4.5 MCG/ACT inhaler Commonly known as: SYMBICORT Inhale 2 puffs into the lungs 2 (two) times daily.   calcium acetate 667 MG capsule Commonly known as: PHOSLO Take 667-1,334 mg by mouth See admin instructions. Take 1334 mg with meals 3 times daily and 667 mg with snacks   cetirizine 10 MG tablet Commonly known as: ZYRTEC Take 1 tablet (10 mg total) by mouth daily as needed for allergies. What changed: when to take this   dextromethorphan-guaiFENesin 30-600 MG 12hr tablet Commonly known as: MUCINEX DM Take 1 tablet by mouth 2 (two) times daily.   diltiazem 240 MG 24 hr capsule Commonly known as: CARDIZEM CD Take 1 capsule (240 mg total) by mouth daily.   FreeStyle Libre 2 Reader Kerrin Mo USE TO TEST BLOOD SUGAR CONTINUOUSLY   FreeStyle Libre 2 Sensor Misc Use multiple times daily to track glucose to prevent highs and lows as a complication of dialysis Dx:Z99.2, E11.59   gabapentin 400 MG capsule Commonly known as: NEURONTIN Take 2 capsules (800 mg total) by mouth See admin instructions. Take it after HD What changed: additional instructions   hydrOXYzine 50 MG tablet Commonly known as: ATARAX Take 1 tablet (50 mg total) by mouth 3 (three) times daily as needed for anxiety.   lidocaine-prilocaine cream Commonly  known as: EMLA Apply 1 application. topically as needed (dialysis).   LUBRICATING EYE DROPS OP Place 1 drop into both eyes daily as needed (dry eyes).   multivitamin Tabs tablet Take 1 tablet by mouth daily.   nitroGLYCERIN 0.4 MG SL tablet Commonly known as: NITROSTAT Place 1 tablet (0.4 mg total) under the tongue every 5 (five) minutes as needed for chest pain.   NovoLOG FlexPen 100 UNIT/ML FlexPen Generic drug: insulin aspart Inject 1 Units into the skin 3 (three) times daily with meals. < 150: 0 units. 150-199:1 unit. 200-249:  2 units. 250-299: 3 units. 300-349: 4 units. Over 350: 5 units   omeprazole 20 MG capsule Commonly known as: PRILOSEC Take 1 capsule (20 mg total) by mouth daily. On an empty stomach   ondansetron 4 MG tablet Commonly known as: ZOFRAN TAKE 1 TABLET BY MOUTH EVERY 8 HOURS AS NEEDED FOR NAUSEA AND VOMITING What changed: See the new instructions.   OneTouch Ultra test strip Generic drug: glucose blood Use to test blood sugar 3 times daily as directed. DX: E11.9   Ozempic (0.25 or 0.5 MG/DOSE) 2 MG/1.5ML Sopn Generic drug: Semaglutide(0.25 or 0.5MG /DOS) Inject 0.5 mg into the skin every Friday.   pantoprazole 40 MG tablet Commonly known as: PROTONIX Take 1 tablet (40 mg total) by mouth 2 (two) times daily.   polyethylene glycol 17 g packet Commonly known as: MIRALAX / GLYCOLAX Take 17 g by mouth daily as needed for moderate constipation or mild constipation.   QUEtiapine 200 MG tablet Commonly known as: SEROQUEL Take 1 tablet (200 mg total) by mouth at bedtime. For sleep and for anxiety   rOPINIRole 1 MG tablet Commonly known as: REQUIP Take 1 tablet (1 mg total) by mouth at bedtime. For leg cramps   rosuvastatin 5 MG tablet Commonly known as: Crestor Take 1 tablet (5 mg total) by mouth daily. For cholesterol What changed: when to take this   ticagrelor 90 MG Tabs tablet Commonly known as: BRILINTA Take 1 tablet (90 mg total) by mouth 2 (two) times daily.   Tyler Aas FlexTouch 100 UNIT/ML FlexTouch Pen Generic drug: insulin degludec Inject 20 Units into the skin at bedtime. Via [employer] patient assistance program What changed: how much to take        Discharge Assessment: Vitals:   06/04/22 0742 06/04/22 1215  BP:  131/80  Pulse:  97  Resp:  20  Temp:    SpO2: 96% 97%   Skin clean, dry and intact without evidence of skin break down, no evidence of skin tears noted. IV catheter discontinued intact. Site without signs and symptoms of complications - no  redness or edema noted at insertion site, patient denies c/o pain - only slight tenderness at site.  Dressing with slight pressure applied.  D/c Instructions-Education: Discharge instructions given to patient/family with verbalized understanding. D/c education completed with patient/family including follow up instructions, medication list, d/c activities limitations if indicated, with other d/c instructions as indicated by MD - patient able to verbalize understanding, all questions fully answered. Patient instructed to return to ED, call 911, or call MD for any changes in condition.  Patient escorted via [place], and D/C home via private auto.  Santa Lighter, RN 06/04/2022 1:51 PM

## 2022-06-04 NOTE — Discharge Summary (Signed)
Physician Discharge Summary  [name] WPY:[id number] DOB: 01/06/1951 DOA: 05/30/2022  PCP: [name], MD  Admit date: 05/30/2022  Discharge date: 06/04/2022  Admitted From:Home  Disposition:  Home  Recommendations for Outpatient Follow-up:  Follow up with PCP in 1-2 weeks Continue home medications as prior and okay to resume Eliquis and Brilinta per GI Continue PPI twice daily and follow-up with GI in 4-6 weeks which will be arranged Follow-up with cardiology outpatient regarding heart rate control Continue hemodialysis per routine schedule TTS with last hemodialysis 7/1 If patient needs MRI in the next 2-3 months she will need KUB prior given hemostatic clip placement  Home Health: Yes with PT  Equipment/Devices: None  Discharge Condition:Stable  CODE STATUS: Full  Diet recommendation: Heart Healthy/carb modified  Brief/Interim Summary: [name] is a 71 y.o. female with medical history significant of CHF with grade 2 diastolic dysfunction, anemia, coronary artery disease, end-stage renal disease with dialysis Tuesday Thursday Saturday, COPD, diabetic neuropathy, GERD, hyperlipidemia, hypertension, insulin-dependent diabetes, stroke, peripheral vascular disease, and more presented to the ED with complaints of chest pain in the setting of symptomatic anemia.  She was noted to have acute on chronic anemia and required 1 unit PRBC transfusion.  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  She had resumed Eliquis and was instructed to remain on PPI twice daily.  Her hemoglobin has remained stable with no further bleeding noted, however she does have persistent arrhythmia.  She is asymptomatic from this and her heart rates remain controlled at rest and with ambulation.  She will follow-up with GI outpatient in the next 4-6 weeks with no other acute  events noted during the course of this admission.  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  Chronic anticoagulation with Eliquis due to PAF.  Discharge Instructions  Discharge Instructions     Diet - low sodium heart healthy   Complete by: As directed    Increase activity slowly   Complete by: As directed       Allergies as of 06/04/2022       Reactions   Azithromycin    Prolonged QT   Ace Inhibitors Cough   Clopidogrel Bisulfate Other (See Comments)   Sick, Headache, "Felt terrible"   Codeine Other (See Comments)   hallucinations   Lisinopril Cough   Penicillins Other (See Comments)   Unknown childhood reaction Did it involve swelling of the face/tongue/throat, SOB, or low BP? Unknown Did it involve sudden or severe rash/hives, skin peeling, or any reaction on the inside of your mouth or nose? Unknown Did you need to seek medical attention at a hospital or doctor's office? Unknown When did it last happen?      childhood allergy If all above answers are "NO", may proceed  with cephalosporin use.        Medication List     STOP taking these medications    aspirin EC 81 MG tablet   predniSONE 10 MG tablet Commonly known as: DELTASONE       TAKE these medications    acetaminophen 500 MG tablet Commonly known as: TYLENOL Take 1,000 mg by mouth every 6 (six) hours  as needed for moderate pain or headache.   albuterol 108 (90 Base) MCG/ACT inhaler Commonly known as: VENTOLIN HFA Inhale 2 puffs into the lungs every 6 (six) hours as needed for wheezing or shortness of breath.   albuterol (2.5 MG/3ML) 0.083% nebulizer solution Commonly known as: PROVENTIL Take 3 mLs (2.5 mg total) by nebulization every 6 (six) hours as needed for wheezing or shortness of breath.   apixaban 5 MG Tabs tablet Commonly known as: ELIQUIS Take 1 tablet (5 mg total) by mouth 2 (two) times daily.   atenolol 100 MG tablet Commonly known as: TENORMIN Take 1 tablet (100 mg total) by mouth at bedtime.   budesonide-formoterol 160-4.5 MCG/ACT inhaler Commonly known as: SYMBICORT Inhale 2 puffs into the lungs 2 (two) times daily.   calcium acetate 667 MG capsule Commonly known as: PHOSLO Take 667-1,334 mg by mouth See admin instructions. Take 1334 mg with meals 3 times daily and 667 mg with snacks   cetirizine 10 MG tablet Commonly known as: ZYRTEC Take 1 tablet (10 mg total) by mouth daily as needed for allergies. What changed: when to take this   dextromethorphan-guaiFENesin 30-600 MG 12hr tablet Commonly known as: MUCINEX DM Take 1 tablet by mouth 2 (two) times daily.   diltiazem 240 MG 24 hr capsule Commonly known as: CARDIZEM CD Take 1 capsule (240 mg total) by mouth daily.   FreeStyle Libre 2 Reader Kerrin Mo USE TO TEST BLOOD SUGAR CONTINUOUSLY   FreeStyle Libre 2 Sensor Misc Use multiple times daily to track glucose to prevent highs and lows as a complication of dialysis Dx:Z99.2, E11.59   gabapentin 400 MG capsule Commonly known as: NEURONTIN Take 2 capsules (800 mg total) by mouth See admin instructions. Take it after HD What changed: additional instructions   hydrOXYzine 50 MG tablet Commonly known as: ATARAX Take 1 tablet (50 mg total) by mouth 3 (three) times daily as needed for anxiety.   lidocaine-prilocaine cream Commonly known as: EMLA Apply 1  application. topically as needed (dialysis).   LUBRICATING EYE DROPS OP Place 1 drop into both eyes daily as needed (dry eyes).   multivitamin Tabs tablet Take 1 tablet by mouth daily.   nitroGLYCERIN 0.4 MG SL tablet Commonly known as: NITROSTAT Place 1 tablet (0.4 mg total) under the tongue every 5 (five) minutes as needed for chest pain.   NovoLOG FlexPen 100 UNIT/ML FlexPen Generic drug: insulin aspart Inject 1 Units into the skin 3 (three) times daily with meals. < 150: 0 units. 150-199:1 unit. 200-249: 2 units. 250-299: 3 units. 300-349: 4 units. Over 350: 5 units   omeprazole 20 MG capsule Commonly known as: PRILOSEC Take 1 capsule (20 mg total) by mouth daily. On an empty stomach   ondansetron 4 MG tablet Commonly known as: ZOFRAN TAKE 1 TABLET BY MOUTH EVERY 8 HOURS AS NEEDED FOR NAUSEA AND VOMITING What changed: See the new instructions.   OneTouch Ultra test strip Generic drug: glucose blood Use to test blood sugar 3 times daily as directed. DX: E11.9   Ozempic (0.25 or 0.5 MG/DOSE) 2 MG/1.5ML Sopn Generic  drug: Semaglutide(0.25 or 0.5MG /DOS) Inject 0.5 mg into the skin every Friday.   pantoprazole 40 MG tablet Commonly known as: PROTONIX Take 1 tablet (40 mg total) by mouth 2 (two) times daily.   polyethylene glycol 17 g packet Commonly known as: MIRALAX / GLYCOLAX Take 17 g by mouth daily as needed for moderate constipation or mild constipation.   QUEtiapine 200 MG tablet Commonly known as: SEROQUEL Take 1 tablet (200 mg total) by mouth at bedtime. For sleep and for anxiety   rOPINIRole 1 MG tablet Commonly known as: REQUIP Take 1 tablet (1 mg total) by mouth at bedtime. For leg cramps   rosuvastatin 5 MG tablet Commonly known as: Crestor Take 1 tablet (5 mg total) by mouth daily. For cholesterol What changed: when to take this   ticagrelor 90 MG Tabs tablet Commonly known as: BRILINTA Take 1 tablet (90 mg total) by mouth 2 (two) times daily.    Tyler Aas FlexTouch 100 UNIT/ML FlexTouch Pen Generic drug: insulin degludec Inject 20 Units into the skin at bedtime. Via [employer] patient assistance program What changed: how much to take        Follow-up Information     [name], [name], MD. Schedule an appointment as soon as possible for a visit in 1 week(s).   Specialty: Family Medicine Contact information: [place] [address] [phone]         Manuel Garcia. Go in 4 week(s).   Contact information: [place] [address] [phone]               Allergies  Allergen Reactions   Azithromycin     Prolonged QT   Ace Inhibitors Cough   Clopidogrel Bisulfate Other (See Comments)    Sick, Headache, "Felt terrible"   Codeine Other (See Comments)    hallucinations   Lisinopril Cough   Penicillins Other (See Comments)    Unknown childhood reaction Did it involve swelling of the face/tongue/throat, SOB, or low BP? Unknown Did it involve sudden or severe rash/hives, skin peeling, or any reaction on the inside of your mouth or nose? Unknown Did you need to seek medical attention at a hospital or doctor's office? Unknown When did it last happen?      childhood allergy If all above answers are "NO", may proceed with cephalosporin use.      Consultations: GI   Procedures/Studies: DG Abd 2 Views  Result Date: 06/01/2022 CLINICAL DATA:  Nausea and vomiting. EXAM: ABDOMEN - 2 VIEW COMPARISON:  Abdominal x-ray 07/11/2017 CTA abdomen and pelvis 10/01/2020. FINDINGS: The bowel gas pattern is normal. There is no evidence of free air. Prominent vascular calcifications are present in the abdomen and pelvis as well as lower extremities. Branching calcifications are seen in the region of the kidneys, likely vascular as seen on comparison CT. There are phleboliths in the pelvis. Degenerative changes affect the lower lumbar spine and hips. IMPRESSION: 1.  Nonobstructive bowel gas pattern. 2. Severe atherosclerotic disease including peripheral vascular calcifications. Electronically Signed   By: Ronney Asters M.D.   On: 06/01/2022 18:18   DG Chest 2 View  Result Date: 05/30/2022 CLINICAL DATA:  Chest pain EXAM: CHEST - 2 VIEW COMPARISON:  04/25/2022, 09/15/2020, 03/04/2022 FINDINGS: Cardiomegaly with mild central congestion. Mild diffuse interstitial opacity without significant change. Aortic atherosclerosis. No pneumothorax. Hardware in the cervical spine. IMPRESSION: Cardiomegaly with slight central congestion. Mild diffuse interstitial opacity without focal airspace disease. Electronically Signed   By:  Donavan Foil M.D.   On: 05/30/2022 17:48   CT Chest Wo Contrast  Result Date: 05/15/2022 CLINICAL DATA:  Follow-up multifocal pneumonia EXAM: CT CHEST WITHOUT CONTRAST TECHNIQUE: Multidetector CT imaging of the chest was performed following the standard protocol without IV contrast. RADIATION DOSE REDUCTION: This exam was performed according to the departmental dose-optimization program which includes automated exposure control, adjustment of the mA and/or kV according to patient size and/or use of iterative reconstruction technique. COMPARISON:  Chest CT dated Apr 25, 2022 FINDINGS: Cardiovascular: Cardiomegaly. No pericardial effusion. Severe left main and three-vessel coronary artery calcifications. Atherosclerotic disease of the thoracic aorta. Mediastinum/Nodes: Thyroid is unremarkable. Patulous esophagus. No pathologically enlarged lymph nodes seen in the chest. Lungs/Pleura: Central airways are patent. Interval decreased of interlobular septal thickening and nodular opacities. Reference nodule of the right lower lobe measuring 6 mm series 4, image 95, unchanged when compared with recent prior, but new when compared with more remote prior. Numerous additional small solid nodules are seen bilaterally which are unchanged when compared with more remote  prior CT dated February 02, 2021. Reference ground-glass nodule of the right lower lobe measuring 5 mm on series 4, image 95, unchanged. Trace left pleural effusion. Upper Abdomen: Low-density left renal lesion which is likely a simple cyst, no further follow-up imaging is recommended for this lesion. Musculoskeletal: No chest wall mass or suspicious bone lesions identified. IMPRESSION: 1. Decreased interlobular septal thickening and nodular opacities, likely due to decreased pulmonary edema or resolving infectious process. Recommend follow-up chest CT in 6 months to ensure complete resolution. 2. Additional numerous bilateral small solid pulmonary nodules are unchanged compared with more remote prior chest CT dated February 02, 2021. 3. Trace left pleural effusion. 4. Cardiomegaly and aortic Atherosclerosis (ICD10-I70.0). Electronically Signed   By: Yetta Glassman M.D.   On: 05/15/2022 12:03   VAS [place] ABI WITH/WO TBI  Result Date: 05/12/2022  LOWER EXTREMITY DOPPLER STUDY Patient Name:  [name]  Date of Exam:   05/12/2022 Medical Rec #: [id number]        Accession #:    [id number] Date of Birth: [birth year]        Patient Gender: F Patient Age:   41 years Exam Location:  [facility] Procedure:      VAS [place] ABI WITH/WO TBI Referring Phys: --------------------------------------------------------------------------------  Indications: Patient complains of increased pain in legs with difficulty              walking. High Risk Factors: Hypertension, coronary artery disease.  Performing Technologist: Ronal Fear RVS, RCS  Examination Guidelines: A complete evaluation includes at minimum, Doppler waveform signals and systolic blood pressure reading at the level of bilateral brachial, anterior tibial, and posterior tibial arteries, when vessel segments are accessible. Bilateral testing is considered an integral part of a complete examination. Photoelectric Plethysmograph (PPG) waveforms and toe systolic  pressure readings are included as required and additional duplex testing as needed. Limited examinations for reoccurring indications may be performed as noted.  ABI Findings: +---------+------------------+-----+----------+--------+ Right    Rt Pressure (mmHg)IndexWaveform  Comment  +---------+------------------+-----+----------+--------+ Brachial                                  AVF      +---------+------------------+-----+----------+--------+ PTA      139               0.85 monophasic         +---------+------------------+-----+----------+--------+  DP       66                0.40 monophasic         +---------+------------------+-----+----------+--------+ Great Toe53                0.32 Abnormal           +---------+------------------+-----+----------+--------+ +---------+------------------+-----+----------+-------+ Left     Lt Pressure (mmHg)IndexWaveform  Comment +---------+------------------+-----+----------+-------+ Brachial 164                                      +---------+------------------+-----+----------+-------+ PTA      151               0.92 monophasic        +---------+------------------+-----+----------+-------+ DP       118               0.72 monophasic        +---------+------------------+-----+----------+-------+ Great Toe                       Absent            +---------+------------------+-----+----------+-------+ +-------+-----------+-----------+------------+------------+ ABI/TBIToday's ABIToday's TBIPrevious ABIPrevious TBI +-------+-----------+-----------+------------+------------+ Right  0.85       0.32       0.99        0.54         +-------+-----------+-----------+------------+------------+ Left   0.92       absent     0.69        0.32         +-------+-----------+-----------+------------+------------+   Summary: Right: Resting right ankle-brachial index indicates noncompressible right lower extremity arteries.  The right toe-brachial index is abnormal. Left: Resting left ankle-brachial index indicates noncompressible left lower extremity arteries. *See table(s) above for measurements and observations.  Electronically signed by Orlie Pollen on 05/12/2022 at 3:16:31 PM.    Final      Discharge Exam: Vitals:   06/04/22 0506 06/04/22 0742  BP: (!) 128/55   Pulse: (!) 58   Resp: 20   Temp: 99.1 F (37.3 C)   SpO2: 94% 96%   Vitals:   06/03/22 2100 06/04/22 0027 06/04/22 0506 06/04/22 0742  BP: (!) 124/51 (!) 132/54 (!) 128/55   Pulse: 86 90 (!) 58   Resp: 18 18 20    Temp:   99.1 F (37.3 C)   TempSrc:   Oral   SpO2: 95% 94% 94% 96%  Weight:      Height:        General: Pt is alert, awake, not in acute distress Cardiovascular: RRR, S1/S2 +, no rubs, no gallops Respiratory: CTA bilaterally, no wheezing, no rhonchi Abdominal: Soft, NT, ND, bowel sounds + Extremities: no edema, no cyanosis    The results of significant diagnostics from this hospitalization (including imaging, microbiology, ancillary and laboratory) are listed below for reference.     Microbiology: No results found for this or any previous visit (from the past 240 hour(s)).   Labs: BNP (last 3 results) Recent Labs    12/06/21 0137 02/28/22 2351 03/26/22 1407  BNP 2,593.0* 1,695.0* 1,448.1*   Basic Metabolic Panel: Recent Labs  Lab 05/30/22 1803 05/31/22 0520 05/31/22 1700 06/01/22 2100 06/02/22 0518 06/03/22 0645 06/04/22 0735  NA 134* 131*  --  134* 134* 134*  --   K 3.4* 2.8* 3.9 4.0 3.6 3.8  --  CL 91* 92*  --  93* 94* 96*  --   CO2 30 27  --  28 28 27   --   GLUCOSE 233* 278*  --  268* 147* 101*  --   BUN 22 28*  --  21 26* 35*  --   CREATININE 5.01* 5.89*  --  4.62* 5.39* 7.40*  --   CALCIUM 8.3* 7.9*  --  8.7* 8.6* 9.2  --   MG  --  2.0  --   --   --   --  1.8  PHOS  --   --   --  3.4  --   --   --    Liver Function Tests: Recent Labs  Lab 05/31/22 0520 06/01/22 2100  AST 23  --   ALT  30  --   ALKPHOS 63  --   BILITOT 0.9  --   PROT 5.3*  --   ALBUMIN 2.4* 2.7*   No results for input(s): "LIPASE", "AMYLASE" in the last 168 hours. No results for input(s): "AMMONIA" in the last 168 hours. CBC: Recent Labs  Lab 05/30/22 1803 05/31/22 0520 06/01/22 0521 06/02/22 0518 06/03/22 0645  WBC 11.7* 8.9 11.1* 10.5 10.9*  NEUTROABS  --  6.9  --  8.2* 8.5*  HGB 7.6* 7.7* 9.3* 8.8* 9.6*  HCT 23.8* 23.8* 28.3* 27.7* 30.1*  MCV 87.5 85.9 86.0 88.2 88.0  PLT 178 149* 156 181 211   Cardiac Enzymes: No results for input(s): "CKTOTAL", "CKMB", "CKMBINDEX", "TROPONINI" in the last 168 hours. BNP: Invalid input(s): "POCBNP" CBG: Recent Labs  Lab 06/03/22 1616 06/03/22 2106 06/04/22 0104 06/04/22 0733 06/04/22 0805  GLUCAP 254* 124* 114* 64* 101*   D-Dimer No results for input(s): "DDIMER" in the last 72 hours. Hgb A1c No results for input(s): "HGBA1C" in the last 72 hours. Lipid Profile No results for input(s): "CHOL", "HDL", "LDLCALC", "TRIG", "CHOLHDL", "LDLDIRECT" in the last 72 hours. Thyroid function studies No results for input(s): "TSH", "T4TOTAL", "T3FREE", "THYROIDAB" in the last 72 hours.  Invalid input(s): "FREET3" Anemia work up No results for input(s): "VITAMINB12", "FOLATE", "FERRITIN", "TIBC", "IRON", "RETICCTPCT" in the last 72 hours. Urinalysis    Component Value Date/Time   COLORURINE YELLOW 04/25/2022 1844   APPEARANCEUR CLOUDY (A) 04/25/2022 1844   APPEARANCEUR Clear 09/26/2017 1059   LABSPEC 1.011 04/25/2022 1844   PHURINE 7.0 04/25/2022 1844   GLUCOSEU 50 (A) 04/25/2022 1844   HGBUR LARGE (A) 04/25/2022 1844   BILIRUBINUR NEGATIVE 04/25/2022 1844   BILIRUBINUR Negative 09/26/2017 [place] 04/25/2022 1844   PROTEINUR 100 (A) 04/25/2022 1844   UROBILINOGEN 0.2 01/03/2012 1650   NITRITE NEGATIVE 04/25/2022 1844   LEUKOCYTESUR TRACE (A) 04/25/2022 1844   Sepsis Labs Recent Labs  Lab 05/31/22 0520 06/01/22 0521  06/02/22 0518 06/03/22 0645  WBC 8.9 11.1* 10.5 10.9*   Microbiology No results found for this or any previous visit (from the past 240 hour(s)).   Time coordinating discharge: 35 minutes  SIGNED:   Rodena Goldmann, DO [facility] 06/04/2022, 10:36 AM  If 7PM-7AM, please contact night-coverage [email or web address]

## 2022-06-05 ENCOUNTER — Telehealth: Payer: Self-pay

## 2022-06-05 ENCOUNTER — Encounter (HOSPITAL_COMMUNITY): Payer: Self-pay | Admitting: Gastroenterology

## 2022-06-05 NOTE — Telephone Encounter (Signed)
Transition Care Management Follow-up Telephone Call Date of discharge and from where: 06/04/2022 [facility] How have you been since you were released from the hospital? Pt states she is feeling, "much better." Any questions or concerns? No  Items Reviewed: Did the pt receive and understand the discharge instructions provided? Yes  Medications obtained and verified? Yes  Other? No  Any new allergies since your discharge? No  Dietary orders reviewed? Yes Do you have support at home? Yes   Home Care and Equipment/Supplies: Were home health services ordered? Pt is currently still under [facility] care. If so, what is the name of the agency? [facility]  Has the agency set up a time to come to the patient's home? not applicable Were any new equipment or medical supplies ordered?  No What is the name of the medical supply agency? N/A Were you able to get the supplies/equipment? not applicable Do you have any questions related to the use of the equipment or supplies? No  Functional Questionnaire: (I = Independent and D = Dependent) ADLs: D  Bathing/Dressing- D  Meal Prep- D  Eating- I  Maintaining continence- I  Transferring/Ambulation- I  Managing Meds- I  Follow up appointments reviewed:  PCP Hospital f/u appt confirmed? Yes  Scheduled to see Jac Canavan, NP on 06/14/2022 @ 12. [place] [facility] f/u appt confirmed? No   Are transportation arrangements needed? No  If their condition worsens, is the pt aware to call PCP or go to the Emergency Dept.? Yes Was the patient provided with contact information for the PCP's office or ED? Yes Was to pt encouraged to call back with questions or concerns? Yes

## 2022-06-06 DIAGNOSIS — D509 Iron deficiency anemia, unspecified: Secondary | ICD-10-CM | POA: Diagnosis not present

## 2022-06-06 DIAGNOSIS — N2581 Secondary hyperparathyroidism of renal origin: Secondary | ICD-10-CM | POA: Diagnosis not present

## 2022-06-06 DIAGNOSIS — N250 Renal osteodystrophy: Secondary | ICD-10-CM | POA: Diagnosis not present

## 2022-06-06 DIAGNOSIS — D631 Anemia in chronic kidney disease: Secondary | ICD-10-CM | POA: Diagnosis not present

## 2022-06-06 DIAGNOSIS — Z992 Dependence on renal dialysis: Secondary | ICD-10-CM | POA: Diagnosis not present

## 2022-06-06 DIAGNOSIS — E559 Vitamin D deficiency, unspecified: Secondary | ICD-10-CM | POA: Diagnosis not present

## 2022-06-06 DIAGNOSIS — N186 End stage renal disease: Secondary | ICD-10-CM | POA: Diagnosis not present

## 2022-06-08 ENCOUNTER — Other Ambulatory Visit: Payer: Self-pay

## 2022-06-08 ENCOUNTER — Emergency Department (HOSPITAL_COMMUNITY)
Admission: EM | Admit: 2022-06-08 | Discharge: 2022-06-09 | Discharge status: Against Medical Advice | Payer: Medicare Other | Source: Home / Self Care

## 2022-06-08 ENCOUNTER — Emergency Department (HOSPITAL_COMMUNITY): Payer: Medicare Other

## 2022-06-08 DIAGNOSIS — Z7901 Long term (current) use of anticoagulants: Secondary | ICD-10-CM | POA: Diagnosis not present

## 2022-06-08 DIAGNOSIS — N2581 Secondary hyperparathyroidism of renal origin: Secondary | ICD-10-CM | POA: Diagnosis not present

## 2022-06-08 DIAGNOSIS — I2510 Atherosclerotic heart disease of native coronary artery without angina pectoris: Secondary | ICD-10-CM | POA: Diagnosis not present

## 2022-06-08 DIAGNOSIS — K921 Melena: Secondary | ICD-10-CM | POA: Diagnosis not present

## 2022-06-08 DIAGNOSIS — D509 Iron deficiency anemia, unspecified: Secondary | ICD-10-CM | POA: Diagnosis not present

## 2022-06-08 DIAGNOSIS — J811 Chronic pulmonary edema: Secondary | ICD-10-CM | POA: Diagnosis not present

## 2022-06-08 DIAGNOSIS — I509 Heart failure, unspecified: Secondary | ICD-10-CM | POA: Diagnosis not present

## 2022-06-08 DIAGNOSIS — J9811 Atelectasis: Secondary | ICD-10-CM | POA: Diagnosis not present

## 2022-06-08 DIAGNOSIS — Z5321 Procedure and treatment not carried out due to patient leaving prior to being seen by health care provider: Secondary | ICD-10-CM | POA: Insufficient documentation

## 2022-06-08 DIAGNOSIS — D62 Acute posthemorrhagic anemia: Secondary | ICD-10-CM | POA: Diagnosis not present

## 2022-06-08 DIAGNOSIS — E1169 Type 2 diabetes mellitus with other specified complication: Secondary | ICD-10-CM | POA: Diagnosis not present

## 2022-06-08 DIAGNOSIS — E1159 Type 2 diabetes mellitus with other circulatory complications: Secondary | ICD-10-CM | POA: Diagnosis not present

## 2022-06-08 DIAGNOSIS — I480 Paroxysmal atrial fibrillation: Secondary | ICD-10-CM | POA: Diagnosis not present

## 2022-06-08 DIAGNOSIS — I5033 Acute on chronic diastolic (congestive) heart failure: Secondary | ICD-10-CM | POA: Diagnosis not present

## 2022-06-08 DIAGNOSIS — E559 Vitamin D deficiency, unspecified: Secondary | ICD-10-CM | POA: Diagnosis present

## 2022-06-08 DIAGNOSIS — R0789 Other chest pain: Secondary | ICD-10-CM | POA: Diagnosis not present

## 2022-06-08 DIAGNOSIS — E1122 Type 2 diabetes mellitus with diabetic chronic kidney disease: Secondary | ICD-10-CM | POA: Diagnosis not present

## 2022-06-08 DIAGNOSIS — J449 Chronic obstructive pulmonary disease, unspecified: Secondary | ICD-10-CM | POA: Diagnosis present

## 2022-06-08 DIAGNOSIS — E1140 Type 2 diabetes mellitus with diabetic neuropathy, unspecified: Secondary | ICD-10-CM | POA: Diagnosis not present

## 2022-06-08 DIAGNOSIS — G4709 Other insomnia: Secondary | ICD-10-CM | POA: Diagnosis present

## 2022-06-08 DIAGNOSIS — K922 Gastrointestinal hemorrhage, unspecified: Secondary | ICD-10-CM | POA: Diagnosis not present

## 2022-06-08 DIAGNOSIS — D649 Anemia, unspecified: Secondary | ICD-10-CM | POA: Diagnosis not present

## 2022-06-08 DIAGNOSIS — R410 Disorientation, unspecified: Secondary | ICD-10-CM | POA: Diagnosis not present

## 2022-06-08 DIAGNOSIS — D500 Iron deficiency anemia secondary to blood loss (chronic): Secondary | ICD-10-CM | POA: Diagnosis not present

## 2022-06-08 DIAGNOSIS — Z992 Dependence on renal dialysis: Secondary | ICD-10-CM | POA: Diagnosis not present

## 2022-06-08 DIAGNOSIS — F419 Anxiety disorder, unspecified: Secondary | ICD-10-CM | POA: Diagnosis not present

## 2022-06-08 DIAGNOSIS — D6832 Hemorrhagic disorder due to extrinsic circulating anticoagulants: Secondary | ICD-10-CM | POA: Diagnosis not present

## 2022-06-08 DIAGNOSIS — I5030 Unspecified diastolic (congestive) heart failure: Secondary | ICD-10-CM | POA: Diagnosis not present

## 2022-06-08 DIAGNOSIS — I5082 Biventricular heart failure: Secondary | ICD-10-CM | POA: Diagnosis present

## 2022-06-08 DIAGNOSIS — R0602 Shortness of breath: Secondary | ICD-10-CM | POA: Insufficient documentation

## 2022-06-08 DIAGNOSIS — R002 Palpitations: Secondary | ICD-10-CM | POA: Diagnosis not present

## 2022-06-08 DIAGNOSIS — I132 Hypertensive heart and chronic kidney disease with heart failure and with stage 5 chronic kidney disease, or end stage renal disease: Secondary | ICD-10-CM | POA: Diagnosis not present

## 2022-06-08 DIAGNOSIS — E1165 Type 2 diabetes mellitus with hyperglycemia: Secondary | ICD-10-CM | POA: Diagnosis present

## 2022-06-08 DIAGNOSIS — R079 Chest pain, unspecified: Secondary | ICD-10-CM | POA: Insufficient documentation

## 2022-06-08 DIAGNOSIS — N250 Renal osteodystrophy: Secondary | ICD-10-CM | POA: Diagnosis not present

## 2022-06-08 DIAGNOSIS — D631 Anemia in chronic kidney disease: Secondary | ICD-10-CM | POA: Diagnosis not present

## 2022-06-08 DIAGNOSIS — I4821 Permanent atrial fibrillation: Secondary | ICD-10-CM | POA: Diagnosis not present

## 2022-06-08 DIAGNOSIS — I2781 Cor pulmonale (chronic): Secondary | ICD-10-CM | POA: Diagnosis not present

## 2022-06-08 DIAGNOSIS — E785 Hyperlipidemia, unspecified: Secondary | ICD-10-CM | POA: Diagnosis not present

## 2022-06-08 DIAGNOSIS — R778 Other specified abnormalities of plasma proteins: Secondary | ICD-10-CM | POA: Diagnosis not present

## 2022-06-08 DIAGNOSIS — I152 Hypertension secondary to endocrine disorders: Secondary | ICD-10-CM | POA: Diagnosis not present

## 2022-06-08 DIAGNOSIS — F32A Depression, unspecified: Secondary | ICD-10-CM | POA: Diagnosis present

## 2022-06-08 DIAGNOSIS — I517 Cardiomegaly: Secondary | ICD-10-CM | POA: Diagnosis not present

## 2022-06-08 DIAGNOSIS — Z9861 Coronary angioplasty status: Secondary | ICD-10-CM | POA: Diagnosis not present

## 2022-06-08 DIAGNOSIS — I10 Essential (primary) hypertension: Secondary | ICD-10-CM | POA: Diagnosis not present

## 2022-06-08 DIAGNOSIS — E1151 Type 2 diabetes mellitus with diabetic peripheral angiopathy without gangrene: Secondary | ICD-10-CM | POA: Diagnosis present

## 2022-06-08 DIAGNOSIS — K219 Gastro-esophageal reflux disease without esophagitis: Secondary | ICD-10-CM | POA: Diagnosis present

## 2022-06-08 DIAGNOSIS — I5032 Chronic diastolic (congestive) heart failure: Secondary | ICD-10-CM | POA: Diagnosis not present

## 2022-06-08 DIAGNOSIS — E876 Hypokalemia: Secondary | ICD-10-CM | POA: Diagnosis not present

## 2022-06-08 DIAGNOSIS — N186 End stage renal disease: Secondary | ICD-10-CM | POA: Diagnosis not present

## 2022-06-08 LAB — COMPREHENSIVE METABOLIC PANEL
ALT: 49 U/L — ABNORMAL HIGH (ref 0–44)
AST: 27 U/L (ref 15–41)
Albumin: 2.4 g/dL — ABNORMAL LOW (ref 3.5–5.0)
Alkaline Phosphatase: 80 U/L (ref 38–126)
Anion gap: 14 (ref 5–15)
BUN: 22 mg/dL (ref 8–23)
CO2: 28 mmol/L (ref 22–32)
Calcium: 9.1 mg/dL (ref 8.9–10.3)
Chloride: 93 mmol/L — ABNORMAL LOW (ref 98–111)
Creatinine, Ser: 4.81 mg/dL — ABNORMAL HIGH (ref 0.44–1.00)
GFR, Estimated: 9 mL/min — ABNORMAL LOW (ref >60)
Glucose, Bld: 163 mg/dL — ABNORMAL HIGH (ref 70–99)
Potassium: 3.4 mmol/L — ABNORMAL LOW (ref 3.5–5.1)
Sodium: 135 mmol/L (ref 135–145)
Total Bilirubin: 0.7 mg/dL (ref 0.3–1.2)
Total Protein: 6 g/dL — ABNORMAL LOW (ref 6.5–8.1)

## 2022-06-08 LAB — CBC WITH DIFFERENTIAL/PLATELET
Abs Immature Granulocytes: 0.07 10*3/uL (ref 0.00–0.07)
Basophils Absolute: 0.1 10*3/uL (ref 0.0–0.1)
Basophils Relative: 1 %
Eosinophils Absolute: 0.3 10*3/uL (ref 0.0–0.5)
Eosinophils Relative: 3 %
HCT: 30 % — ABNORMAL LOW (ref 36.0–46.0)
Hemoglobin: 9.5 g/dL — ABNORMAL LOW (ref 12.0–15.0)
Immature Granulocytes: 1 %
Lymphocytes Relative: 15 %
Lymphs Abs: 1.7 10*3/uL (ref 0.7–4.0)
MCH: 27.2 pg (ref 26.0–34.0)
MCHC: 31.7 g/dL (ref 30.0–36.0)
MCV: 86 fL (ref 80.0–100.0)
Monocytes Absolute: 1 10*3/uL (ref 0.1–1.0)
Monocytes Relative: 9 %
Neutro Abs: 7.9 10*3/uL — ABNORMAL HIGH (ref 1.7–7.7)
Neutrophils Relative %: 71 %
Platelets: 479 10*3/uL — ABNORMAL HIGH (ref 150–400)
RBC: 3.49 MIL/uL — ABNORMAL LOW (ref 3.87–5.11)
RDW: 16.8 % — ABNORMAL HIGH (ref 11.5–15.5)
WBC: 11 10*3/uL — ABNORMAL HIGH (ref 4.0–10.5)
nRBC: 0.5 % — ABNORMAL HIGH (ref 0.0–0.2)

## 2022-06-08 LAB — TROPONIN I (HIGH SENSITIVITY): Troponin I (High Sensitivity): 53 ng/L — ABNORMAL HIGH (ref <18)

## 2022-06-08 NOTE — ED Provider Triage Note (Signed)
Emergency Medicine Provider Triage Evaluation Note  [name] , a 71 y.o. female  was evaluated in triage.  Pt complains of chest pain and palpitations.  She is feeling like her heart is racing.  Has a history of A-fib and states that this feels similar.  Symptoms began today.  Reports some shortness of breath as well.  Denies any cough or leg swelling.  Review of Systems  Positive: Chest pain, palpitations, shortness of breath Negative: Cough, leg swelling  Physical Exam  BP 139/69   Pulse 92   Temp 99.5 F (37.5 C) (Oral)   Resp 18   SpO2 99%  Gen:   Awake, no distress   Resp:  Normal effort  MSK:   Moves extremities without difficulty  Other:  Tachycardic  Medical Decision Making  Medically screening exam initiated at 6:46 PM.  Appropriate orders placed.  [name] was informed that the remainder of the evaluation will be completed by another provider, this initial triage assessment does not replace that evaluation, and the importance of remaining in the ED until their evaluation is complete.  Labs and ekg ordered   Delia Heady, [place] 06/08/22 [id number]

## 2022-06-08 NOTE — ED Triage Notes (Signed)
Pt c/o CP, palpitations, "like heart is racing." Hx afib, states it feels similar to exacerbation. Some associated SHOB. On eliquis for same, compliant w medications

## 2022-06-09 ENCOUNTER — Encounter (HOSPITAL_BASED_OUTPATIENT_CLINIC_OR_DEPARTMENT_OTHER): Payer: Self-pay

## 2022-06-09 ENCOUNTER — Inpatient Hospital Stay (HOSPITAL_BASED_OUTPATIENT_CLINIC_OR_DEPARTMENT_OTHER)
Admission: EM | Admit: 2022-06-09 | Discharge: 2022-06-15 | DRG: 377 | Disposition: A | Discharge status: Home Health | Payer: Medicare Other | Attending: Internal Medicine | Admitting: Internal Medicine

## 2022-06-09 ENCOUNTER — Telehealth: Payer: Self-pay | Admitting: Cardiology

## 2022-06-09 ENCOUNTER — Emergency Department (HOSPITAL_BASED_OUTPATIENT_CLINIC_OR_DEPARTMENT_OTHER): Payer: Medicare Other

## 2022-06-09 ENCOUNTER — Ambulatory Visit (INDEPENDENT_AMBULATORY_CARE_PROVIDER_SITE_OTHER): Payer: Medicare Other | Admitting: *Deleted

## 2022-06-09 ENCOUNTER — Encounter: Payer: Self-pay | Admitting: *Deleted

## 2022-06-09 DIAGNOSIS — D631 Anemia in chronic kidney disease: Secondary | ICD-10-CM | POA: Diagnosis present

## 2022-06-09 DIAGNOSIS — D6832 Hemorrhagic disorder due to extrinsic circulating anticoagulants: Secondary | ICD-10-CM | POA: Diagnosis present

## 2022-06-09 DIAGNOSIS — Z7951 Long term (current) use of inhaled steroids: Secondary | ICD-10-CM

## 2022-06-09 DIAGNOSIS — K219 Gastro-esophageal reflux disease without esophagitis: Secondary | ICD-10-CM | POA: Diagnosis present

## 2022-06-09 DIAGNOSIS — I4891 Unspecified atrial fibrillation: Secondary | ICD-10-CM

## 2022-06-09 DIAGNOSIS — Z7902 Long term (current) use of antithrombotics/antiplatelets: Secondary | ICD-10-CM

## 2022-06-09 DIAGNOSIS — N186 End stage renal disease: Secondary | ICD-10-CM

## 2022-06-09 DIAGNOSIS — R778 Other specified abnormalities of plasma proteins: Secondary | ICD-10-CM | POA: Diagnosis present

## 2022-06-09 DIAGNOSIS — M542 Cervicalgia: Secondary | ICD-10-CM | POA: Diagnosis present

## 2022-06-09 DIAGNOSIS — E1140 Type 2 diabetes mellitus with diabetic neuropathy, unspecified: Secondary | ICD-10-CM | POA: Diagnosis present

## 2022-06-09 DIAGNOSIS — I132 Hypertensive heart and chronic kidney disease with heart failure and with stage 5 chronic kidney disease, or end stage renal disease: Secondary | ICD-10-CM | POA: Diagnosis present

## 2022-06-09 DIAGNOSIS — I5082 Biventricular heart failure: Secondary | ICD-10-CM | POA: Diagnosis present

## 2022-06-09 DIAGNOSIS — I5030 Unspecified diastolic (congestive) heart failure: Secondary | ICD-10-CM | POA: Diagnosis present

## 2022-06-09 DIAGNOSIS — E1122 Type 2 diabetes mellitus with diabetic chronic kidney disease: Secondary | ICD-10-CM | POA: Diagnosis not present

## 2022-06-09 DIAGNOSIS — Z880 Allergy status to penicillin: Secondary | ICD-10-CM

## 2022-06-09 DIAGNOSIS — Z8673 Personal history of transient ischemic attack (TIA), and cerebral infarction without residual deficits: Secondary | ICD-10-CM

## 2022-06-09 DIAGNOSIS — I2510 Atherosclerotic heart disease of native coronary artery without angina pectoris: Secondary | ICD-10-CM

## 2022-06-09 DIAGNOSIS — E876 Hypokalemia: Secondary | ICD-10-CM | POA: Diagnosis present

## 2022-06-09 DIAGNOSIS — Z90710 Acquired absence of both cervix and uterus: Secondary | ICD-10-CM

## 2022-06-09 DIAGNOSIS — D62 Acute posthemorrhagic anemia: Secondary | ICD-10-CM | POA: Diagnosis present

## 2022-06-09 DIAGNOSIS — G8929 Other chronic pain: Secondary | ICD-10-CM | POA: Diagnosis present

## 2022-06-09 DIAGNOSIS — K2970 Gastritis, unspecified, without bleeding: Secondary | ICD-10-CM | POA: Diagnosis present

## 2022-06-09 DIAGNOSIS — Z79899 Other long term (current) drug therapy: Secondary | ICD-10-CM

## 2022-06-09 DIAGNOSIS — Z888 Allergy status to other drugs, medicaments and biological substances status: Secondary | ICD-10-CM

## 2022-06-09 DIAGNOSIS — I4821 Permanent atrial fibrillation: Secondary | ICD-10-CM | POA: Diagnosis present

## 2022-06-09 DIAGNOSIS — Z885 Allergy status to narcotic agent status: Secondary | ICD-10-CM

## 2022-06-09 DIAGNOSIS — J9811 Atelectasis: Secondary | ICD-10-CM | POA: Diagnosis not present

## 2022-06-09 DIAGNOSIS — Z955 Presence of coronary angioplasty implant and graft: Secondary | ICD-10-CM

## 2022-06-09 DIAGNOSIS — J449 Chronic obstructive pulmonary disease, unspecified: Secondary | ICD-10-CM | POA: Diagnosis present

## 2022-06-09 DIAGNOSIS — I10 Essential (primary) hypertension: Secondary | ICD-10-CM | POA: Diagnosis present

## 2022-06-09 DIAGNOSIS — Z602 Problems related to living alone: Secondary | ICD-10-CM | POA: Diagnosis present

## 2022-06-09 DIAGNOSIS — D649 Anemia, unspecified: Secondary | ICD-10-CM

## 2022-06-09 DIAGNOSIS — F32A Depression, unspecified: Secondary | ICD-10-CM | POA: Diagnosis present

## 2022-06-09 DIAGNOSIS — K921 Melena: Principal | ICD-10-CM | POA: Diagnosis present

## 2022-06-09 DIAGNOSIS — I480 Paroxysmal atrial fibrillation: Secondary | ICD-10-CM | POA: Diagnosis not present

## 2022-06-09 DIAGNOSIS — I152 Hypertension secondary to endocrine disorders: Secondary | ICD-10-CM | POA: Diagnosis not present

## 2022-06-09 DIAGNOSIS — T45515A Adverse effect of anticoagulants, initial encounter: Secondary | ICD-10-CM | POA: Diagnosis present

## 2022-06-09 DIAGNOSIS — E1169 Type 2 diabetes mellitus with other specified complication: Secondary | ICD-10-CM | POA: Diagnosis present

## 2022-06-09 DIAGNOSIS — E1121 Type 2 diabetes mellitus with diabetic nephropathy: Secondary | ICD-10-CM | POA: Diagnosis present

## 2022-06-09 DIAGNOSIS — Z87891 Personal history of nicotine dependence: Secondary | ICD-10-CM

## 2022-06-09 DIAGNOSIS — F411 Generalized anxiety disorder: Secondary | ICD-10-CM | POA: Diagnosis present

## 2022-06-09 DIAGNOSIS — I5033 Acute on chronic diastolic (congestive) heart failure: Secondary | ICD-10-CM | POA: Diagnosis not present

## 2022-06-09 DIAGNOSIS — G4709 Other insomnia: Secondary | ICD-10-CM | POA: Diagnosis present

## 2022-06-09 DIAGNOSIS — E1159 Type 2 diabetes mellitus with other circulatory complications: Secondary | ICD-10-CM | POA: Diagnosis not present

## 2022-06-09 DIAGNOSIS — M1990 Unspecified osteoarthritis, unspecified site: Secondary | ICD-10-CM | POA: Diagnosis present

## 2022-06-09 DIAGNOSIS — M898X9 Other specified disorders of bone, unspecified site: Secondary | ICD-10-CM | POA: Diagnosis present

## 2022-06-09 DIAGNOSIS — Z961 Presence of intraocular lens: Secondary | ICD-10-CM | POA: Diagnosis present

## 2022-06-09 DIAGNOSIS — K922 Gastrointestinal hemorrhage, unspecified: Principal | ICD-10-CM

## 2022-06-09 DIAGNOSIS — N2581 Secondary hyperparathyroidism of renal origin: Secondary | ICD-10-CM | POA: Diagnosis present

## 2022-06-09 DIAGNOSIS — Z7985 Long-term (current) use of injectable non-insulin antidiabetic drugs: Secondary | ICD-10-CM

## 2022-06-09 DIAGNOSIS — E782 Mixed hyperlipidemia: Secondary | ICD-10-CM | POA: Diagnosis present

## 2022-06-09 DIAGNOSIS — E1165 Type 2 diabetes mellitus with hyperglycemia: Secondary | ICD-10-CM | POA: Diagnosis present

## 2022-06-09 DIAGNOSIS — E559 Vitamin D deficiency, unspecified: Secondary | ICD-10-CM | POA: Diagnosis present

## 2022-06-09 DIAGNOSIS — I5032 Chronic diastolic (congestive) heart failure: Secondary | ICD-10-CM | POA: Diagnosis present

## 2022-06-09 DIAGNOSIS — Z7901 Long term (current) use of anticoagulants: Secondary | ICD-10-CM

## 2022-06-09 DIAGNOSIS — Z992 Dependence on renal dialysis: Secondary | ICD-10-CM

## 2022-06-09 DIAGNOSIS — Z794 Long term (current) use of insulin: Secondary | ICD-10-CM

## 2022-06-09 DIAGNOSIS — I2781 Cor pulmonale (chronic): Secondary | ICD-10-CM | POA: Diagnosis present

## 2022-06-09 DIAGNOSIS — Z833 Family history of diabetes mellitus: Secondary | ICD-10-CM

## 2022-06-09 DIAGNOSIS — F419 Anxiety disorder, unspecified: Secondary | ICD-10-CM

## 2022-06-09 DIAGNOSIS — E1151 Type 2 diabetes mellitus with diabetic peripheral angiopathy without gangrene: Secondary | ICD-10-CM | POA: Diagnosis present

## 2022-06-09 DIAGNOSIS — Z8249 Family history of ischemic heart disease and other diseases of the circulatory system: Secondary | ICD-10-CM

## 2022-06-09 LAB — TROPONIN I (HIGH SENSITIVITY): Troponin I (High Sensitivity): 33 ng/L — ABNORMAL HIGH (ref <18)

## 2022-06-09 LAB — OCCULT BLOOD X 1 CARD TO LAB, STOOL: Fecal Occult Bld: POSITIVE — AB

## 2022-06-09 LAB — CBC WITH DIFFERENTIAL/PLATELET
Abs Immature Granulocytes: 0.08 10*3/uL — ABNORMAL HIGH (ref 0.00–0.07)
Basophils Absolute: 0.1 10*3/uL (ref 0.0–0.1)
Basophils Relative: 1 %
Eosinophils Absolute: 0.3 10*3/uL (ref 0.0–0.5)
Eosinophils Relative: 3 %
HCT: 25.9 % — ABNORMAL LOW (ref 36.0–46.0)
Hemoglobin: 8.3 g/dL — ABNORMAL LOW (ref 12.0–15.0)
Immature Granulocytes: 1 %
Lymphocytes Relative: 11 %
Lymphs Abs: 1.1 10*3/uL (ref 0.7–4.0)
MCH: 27.3 pg (ref 26.0–34.0)
MCHC: 32 g/dL (ref 30.0–36.0)
MCV: 85.2 fL (ref 80.0–100.0)
Monocytes Absolute: 1 10*3/uL (ref 0.1–1.0)
Monocytes Relative: 11 %
Neutro Abs: 7.1 10*3/uL (ref 1.7–7.7)
Neutrophils Relative %: 73 %
Platelets: 496 10*3/uL — ABNORMAL HIGH (ref 150–400)
RBC: 3.04 MIL/uL — ABNORMAL LOW (ref 3.87–5.11)
RDW: 17.2 % — ABNORMAL HIGH (ref 11.5–15.5)
WBC: 9.7 10*3/uL (ref 4.0–10.5)
nRBC: 0.6 % — ABNORMAL HIGH (ref 0.0–0.2)

## 2022-06-09 LAB — CBG MONITORING, ED: Glucose-Capillary: 231 mg/dL — ABNORMAL HIGH (ref 70–99)

## 2022-06-09 LAB — HEPATIC FUNCTION PANEL
ALT: 36 U/L (ref 0–44)
AST: 16 U/L (ref 15–41)
Albumin: 3.2 g/dL — ABNORMAL LOW (ref 3.5–5.0)
Alkaline Phosphatase: 75 U/L (ref 38–126)
Bilirubin, Direct: 0.1 mg/dL (ref 0.0–0.2)
Indirect Bilirubin: 0.3 mg/dL (ref 0.3–0.9)
Total Bilirubin: 0.4 mg/dL (ref 0.3–1.2)
Total Protein: 5.9 g/dL — ABNORMAL LOW (ref 6.5–8.1)

## 2022-06-09 LAB — BASIC METABOLIC PANEL
Anion gap: 14 (ref 5–15)
BUN: 38 mg/dL — ABNORMAL HIGH (ref 8–23)
CO2: 28 mmol/L (ref 22–32)
Calcium: 9.5 mg/dL (ref 8.9–10.3)
Chloride: 93 mmol/L — ABNORMAL LOW (ref 98–111)
Creatinine, Ser: 6.15 mg/dL — ABNORMAL HIGH (ref 0.44–1.00)
GFR, Estimated: 7 mL/min — ABNORMAL LOW (ref >60)
Glucose, Bld: 224 mg/dL — ABNORMAL HIGH (ref 70–99)
Potassium: 3.2 mmol/L — ABNORMAL LOW (ref 3.5–5.1)
Sodium: 135 mmol/L (ref 135–145)

## 2022-06-09 MED ORDER — PANTOPRAZOLE SODIUM 40 MG IV SOLR
40.0000 mg | Freq: Two times a day (BID) | INTRAVENOUS | Status: DC
  Administered 2022-06-10 – 2022-06-11 (×4): 40 mg via INTRAVENOUS
  Filled 2022-06-09 (×4): qty 10

## 2022-06-09 MED ORDER — ALBUTEROL SULFATE HFA 108 (90 BASE) MCG/ACT IN AERS
2.0000 | INHALATION_SPRAY | RESPIRATORY_TRACT | Status: DC | PRN
  Administered 2022-06-09: 2 via RESPIRATORY_TRACT
  Filled 2022-06-09: qty 6.7

## 2022-06-09 MED ORDER — AEROCHAMBER PLUS FLO-VU MISC
1.0000 | Freq: Once | Status: AC
Start: 2022-06-09 — End: 2022-06-09
  Administered 2022-06-09: 1
  Filled 2022-06-09: qty 1

## 2022-06-09 NOTE — ED Provider Notes (Incomplete)
[facility] EMERGENCY DEPT Provider Note   CSN: [id number] Arrival date & time: 06/09/22  1755     History {Add pertinent medical, surgical, social history, OB history to HPI:1} Chief Complaint  Patient presents with  . Palpitations  . Weakness    [name] is a 71 y.o. female.   Palpitations Associated symptoms: weakness   Weakness      Home Medications Prior to Admission medications   Medication Sig Start Date End Date Taking? Authorizing Provider  acetaminophen (TYLENOL) 500 MG tablet Take 1,000 mg by mouth every 6 (six) hours as needed for moderate pain or headache.    [provider]  albuterol (PROVENTIL) (2.5 MG/3ML) 0.083% nebulizer solution Take 3 mLs (2.5 mg total) by nebulization every 6 (six) hours as needed for wheezing or shortness of breath. 12/02/21   [name], MD  albuterol (VENTOLIN HFA) 108 (90 Base) MCG/ACT inhaler Inhale 2 puffs into the lungs every 6 (six) hours as needed for wheezing or shortness of breath. 12/02/21   [name], MD  apixaban (ELIQUIS) 5 MG TABS tablet Take 1 tablet (5 mg total) by mouth 2 (two) times daily. 04/05/22   Minus Breeding, MD  atenolol (TENORMIN) 100 MG tablet Take 1 tablet (100 mg total) by mouth at bedtime. 05/10/22   [name], MD  budesonide-formoterol (SYMBICORT) 160-4.5 MCG/ACT inhaler Inhale 2 puffs into the lungs 2 (two) times daily. 05/03/22   [name], MD  calcium acetate (PHOSLO) 667 MG capsule Take 667-1,334 mg by mouth See admin instructions. Take 1334 mg with meals 3 times daily and 667 mg with snacks    [provider]  Carboxymethylcellul-Glycerin (LUBRICATING EYE DROPS OP) Place 1 drop into both eyes daily as needed (dry eyes).    [provider]  cetirizine (ZYRTEC) 10 MG tablet Take 1 tablet (10 mg total) by mouth daily as needed for allergies. Patient taking differently: Take 10 mg by mouth daily. 02/08/22   [name], MD  Continuous Blood  Gluc Receiver (FREESTYLE LIBRE 2 READER) DEVI USE TO TEST BLOOD SUGAR CONTINUOUSLY 11/15/21   [name], MD  Continuous Blood Gluc Sensor (FREESTYLE LIBRE 2 SENSOR) MISC Use multiple times daily to track glucose to prevent highs and lows as a complication of dialysis Dx:Z99.2, E11.59 09/15/21   [name], MD  dextromethorphan-guaiFENesin (MUCINEX DM) 30-600 MG 12hr tablet Take 1 tablet by mouth 2 (two) times daily. Patient not taking: Reported on 05/30/2022 04/28/22   Barton Dubois, MD  diltiazem (CARDIZEM CD) 240 MG 24 hr capsule Take 1 capsule (240 mg total) by mouth daily. 05/17/22   Minus Breeding, MD  gabapentin (NEURONTIN) 400 MG capsule Take 2 capsules (800 mg total) by mouth See admin instructions. Take it after HD Patient taking differently: Take 800 mg by mouth See admin instructions. Take after dialysis 03/22/22   [name], MD  glucose blood (ONETOUCH ULTRA) test strip Use to test blood sugar 3 times daily as directed. DX: E11.9 09/13/20   [name], MD  hydrOXYzine (ATARAX) 50 MG tablet Take 1 tablet (50 mg total) by mouth 3 (three) times daily as needed for anxiety. 03/22/22   [name], MD  insulin aspart (NOVOLOG FLEXPEN) 100 UNIT/ML FlexPen Inject 1 Units into the skin 3 (three) times daily with meals. < 150: 0 units. 150-199:1 unit. 200-249: 2 units. 250-299: 3 units. 300-349: 4 units. Over 350: 5 units 05/19/22   Rakes, Connye Burkitt, FNP  insulin degludec (TRESIBA FLEXTOUCH) 100 UNIT/ML FlexTouch  Pen Inject 20 Units into the skin at bedtime. Via novo nordisk patient assistance program Patient taking differently: Inject 30 Units into the skin at bedtime. Via novo nordisk patient assistance program 05/03/22   [name], MD  lidocaine-prilocaine (EMLA) cream Apply 1 application. topically as needed (dialysis).    [provider]  multivitamin (RENA-VIT) TABS tablet Take 1 tablet by mouth daily. 05/13/22   [provider]  nitroGLYCERIN (NITROSTAT)  0.4 MG SL tablet Place 1 tablet (0.4 mg total) under the tongue every 5 (five) minutes as needed for chest pain. 11/09/21   Minus Breeding, MD  omeprazole (PRILOSEC) 20 MG capsule Take 1 capsule (20 mg total) by mouth daily. On an empty stomach 05/10/22   [name], MD  ondansetron (ZOFRAN) 4 MG tablet TAKE 1 TABLET BY MOUTH EVERY 8 HOURS AS NEEDED FOR NAUSEA AND VOMITING Patient taking differently: Take 4 mg by mouth every 8 (eight) hours as needed for nausea or vomiting. 05/19/22   Baruch Gouty, FNP  pantoprazole (PROTONIX) 40 MG tablet Take 1 tablet (40 mg total) by mouth 2 (two) times daily. 06/04/22 08/03/22  Manuella Ghazi, Pratik D, DO  polyethylene glycol (MIRALAX / GLYCOLAX) 17 g packet Take 17 g by mouth daily as needed for moderate constipation or mild constipation. 03/10/22   Pokhrel, Corrie Mckusick, MD  QUEtiapine (SEROQUEL) 200 MG tablet Take 1 tablet (200 mg total) by mouth at bedtime. For sleep and for anxiety 03/17/22   [name], MD  rOPINIRole (REQUIP) 1 MG tablet Take 1 tablet (1 mg total) by mouth at bedtime. For leg cramps 09/15/21   [name], MD  rosuvastatin (CRESTOR) 5 MG tablet Take 1 tablet (5 mg total) by mouth daily. For cholesterol Patient taking differently: Take 5 mg by mouth at bedtime. For cholesterol 12/12/21   [name], MD  Semaglutide,0.25 or 0.5MG /DOS, (OZEMPIC, 0.25 OR 0.5 MG/DOSE,) 2 MG/1.5ML SOPN Inject 0.5 mg into the skin every Friday.    [provider]  ticagrelor (BRILINTA) 90 MG TABS tablet Take 1 tablet (90 mg total) by mouth 2 (two) times daily. 06/04/22 07/04/22  Manuella Ghazi, Pratik D, DO      Allergies    Azithromycin, Ace inhibitors, Clopidogrel bisulfate, Codeine, Lisinopril, and Penicillins    Review of Systems   Review of Systems  Cardiovascular:  Positive for palpitations.  Neurological:  Positive for weakness.    Physical Exam Updated Vital Signs BP (!) 118/51   Pulse 84   Temp 97.6 F (36.4 C) (Oral)   Resp 20   Ht 5\' 3"  (1.6 m)   Wt  80.7 kg   SpO2 100%   BMI 31.53 kg/m  Physical Exam  ED Results / Procedures / Treatments   Labs (all labs ordered are listed, but only abnormal results are displayed) Labs Reviewed  CBG MONITORING, ED - Abnormal; Notable for the following components:      Result Value   Glucose-Capillary 231 (*)    All other components within normal limits  CBC WITH DIFFERENTIAL/PLATELET  BASIC METABOLIC PANEL  HEPATIC FUNCTION PANEL    EKG None  Radiology DG Chest 2 View  Result Date: 06/08/2022 CLINICAL DATA:  Shortness of breath EXAM: CHEST - 2 VIEW COMPARISON:  05/30/2022 FINDINGS: Unchanged mild cardiomegaly with pulmonary vascular congestion. No overt pulmonary edema or focal airspace consolidation. No pleural effusion. IMPRESSION: Cardiomegaly and pulmonary vascular congestion without overt pulmonary edema. Electronically Signed   By: Ulyses Jarred M.D.   On: 06/08/2022 19:32  Procedures Procedures  {Document cardiac monitor, telemetry assessment procedure when appropriate:1}  Medications Ordered in ED Medications  albuterol (VENTOLIN HFA) 108 (90 Base) MCG/ACT inhaler 2 puff (2 puffs Inhalation Given 06/09/22 2035)  aerochamber plus with mask device 1 each (1 each Other Given 06/09/22 2035)    ED Course/ Medical Decision Making/ A&P                           Medical Decision Making Amount and/or Complexity of Data Reviewed Labs: ordered. Radiology: ordered.  Risk Prescription drug management.   ***  {Document critical care time when appropriate:1} {Document review of labs and clinical decision tools ie heart score, Chads2Vasc2 etc:1}  {Document your independent review of radiology images, and any outside records:1} {Document your discussion with family members, caretakers, and with consultants:1} {Document social determinants of health affecting pt's care:1} {Document your decision making why or why not admission, treatments were needed:1} Final Clinical Impression(s) /  ED Diagnoses Final diagnoses:  None    Rx / DC Orders ED Discharge Orders     None

## 2022-06-09 NOTE — ED Notes (Signed)
Spoke to lab about trop add on

## 2022-06-09 NOTE — ED Notes (Signed)
RT educated pt on proper use of MDI w/spacer. Pt also educated on proper use of maintenance inhaler Symbicort. Pt verbalizes understanding and importance of taking her respiratory medications (and all others) as directed by her physicians. RT suggested pt make appt with her pulmonologist [place]  since she is having trouble with her breathing for review of medications and any possible changes that need to be made to help her with her symptoms. Daughter present and verbalizes understanding of teaching as well.

## 2022-06-09 NOTE — ED Notes (Signed)
CALLED X4 NO ANSWER WHEN CALLED FOR AROOM

## 2022-06-09 NOTE — ED Notes (Signed)
Called patient 2 more times No Answer.

## 2022-06-09 NOTE — ED Triage Notes (Signed)
"  Went to [facility] last night and had blood work and chest x-ray done, but they never took me to a bed and I got tired of waiting and left" per pt

## 2022-06-09 NOTE — Telephone Encounter (Signed)
Christen from [facility]  STAT if HR is under 50 or over 120 (normal HR is 60-100 beats per minute)  What is your heart rate?  N/a   Do you have a log of your heart rate readings (document readings)?  42 114  Do you have any other symptoms? Holley Dexter, nurse at [facility] called stating that she wanted to leave a message with Dr. Rosezella [place] nurse about pt HR. She states it has been up and down. She states pt was seen in the hospital yesterday and just left today and will see Dr. Curt Bears 06/26/22 She states that it is best to call the patient back - [phone]

## 2022-06-09 NOTE — Chronic Care Management (AMB) (Signed)
Chronic Care Management   CCM RN Visit Note  06/09/2022 Name: [name] MRN: [id number] DOB: 10-24-1951  Subjective: [name] is a 71 y.o. year old female who is a primary care patient of [name], [name], MD. The care management team was consulted for assistance with disease management and care coordination needs.    Engaged with patient by telephone for follow up visit in response to provider referral for case management and/or care coordination services.   Consent to Services:  The patient was given information about Chronic Care Management services, agreed to services, and gave verbal consent prior to initiation of services.  Please see initial visit note for detailed documentation.   Patient agreed to services and verbal consent obtained.   Assessment: Review of patient past medical history, allergies, medications, health status, including review of consultants reports, laboratory and other test data, was performed as part of comprehensive evaluation and provision of chronic care management services.   SDOH (Social Determinants of Health) assessments and interventions performed:    CCM Care Plan  Allergies  Allergen Reactions   Azithromycin     Prolonged QT   Ace Inhibitors Cough   Clopidogrel Bisulfate Other (See Comments)    Sick, Headache, "Felt terrible"   Codeine Other (See Comments)    hallucinations   Lisinopril Cough   Penicillins Other (See Comments)    Unknown childhood reaction Did it involve swelling of the face/tongue/throat, SOB, or low BP? Unknown Did it involve sudden or severe rash/hives, skin peeling, or any reaction on the inside of your mouth or nose? Unknown Did you need to seek medical attention at a hospital or doctor's office? Unknown When did it last happen?      childhood allergy If all above answers are "NO", may proceed with cephalosporin use.      Outpatient Encounter Medications as of 06/09/2022  Medication Sig Note    acetaminophen (TYLENOL) 500 MG tablet Take 1,000 mg by mouth every 6 (six) hours as needed for moderate pain or headache.    albuterol (PROVENTIL) (2.5 MG/3ML) 0.083% nebulizer solution Take 3 mLs (2.5 mg total) by nebulization every 6 (six) hours as needed for wheezing or shortness of breath.    albuterol (VENTOLIN HFA) 108 (90 Base) MCG/ACT inhaler Inhale 2 puffs into the lungs every 6 (six) hours as needed for wheezing or shortness of breath.    apixaban (ELIQUIS) 5 MG TABS tablet Take 1 tablet (5 mg total) by mouth 2 (two) times daily.    atenolol (TENORMIN) 100 MG tablet Take 1 tablet (100 mg total) by mouth at bedtime.    budesonide-formoterol (SYMBICORT) 160-4.5 MCG/ACT inhaler Inhale 2 puffs into the lungs 2 (two) times daily.    calcium acetate (PHOSLO) 667 MG capsule Take 667-1,334 mg by mouth See admin instructions. Take 1334 mg with meals 3 times daily and 667 mg with snacks    Carboxymethylcellul-Glycerin (LUBRICATING EYE DROPS OP) Place 1 drop into both eyes daily as needed (dry eyes).    cetirizine (ZYRTEC) 10 MG tablet Take 1 tablet (10 mg total) by mouth daily as needed for allergies. (Patient taking differently: Take 10 mg by mouth daily.)    Continuous Blood Gluc Receiver (FREESTYLE LIBRE 2 READER) DEVI USE TO TEST BLOOD SUGAR CONTINUOUSLY    Continuous Blood Gluc Sensor (FREESTYLE LIBRE 2 SENSOR) MISC Use multiple times daily to track glucose to prevent highs and lows as a complication of dialysis Dx:Z99.2, E11.59    dextromethorphan-guaiFENesin (MUCINEX DM) 30-600  MG 12hr tablet Take 1 tablet by mouth 2 (two) times daily. (Patient not taking: Reported on 05/30/2022)    diltiazem (CARDIZEM CD) 240 MG 24 hr capsule Take 1 capsule (240 mg total) by mouth daily.    gabapentin (NEURONTIN) 400 MG capsule Take 2 capsules (800 mg total) by mouth See admin instructions. Take it after HD (Patient taking differently: Take 800 mg by mouth See admin instructions. Take after dialysis)    glucose  blood (ONETOUCH ULTRA) test strip Use to test blood sugar 3 times daily as directed. DX: E11.9    hydrOXYzine (ATARAX) 50 MG tablet Take 1 tablet (50 mg total) by mouth 3 (three) times daily as needed for anxiety.    insulin aspart (NOVOLOG FLEXPEN) 100 UNIT/ML FlexPen Inject 1 Units into the skin 3 (three) times daily with meals. < 150: 0 units. 150-199:1 unit. 200-249: 2 units. 250-299: 3 units. 300-349: 4 units. Over 350: 5 units    insulin degludec (TRESIBA FLEXTOUCH) 100 UNIT/ML FlexTouch Pen Inject 20 Units into the skin at bedtime. Via novo nordisk patient assistance program (Patient taking differently: Inject 30 Units into the skin at bedtime. Via novo nordisk patient assistance program)    lidocaine-prilocaine (EMLA) cream Apply 1 application. topically as needed (dialysis).    multivitamin (RENA-VIT) TABS tablet Take 1 tablet by mouth daily.    nitroGLYCERIN (NITROSTAT) 0.4 MG SL tablet Place 1 tablet (0.4 mg total) under the tongue every 5 (five) minutes as needed for chest pain.    omeprazole (PRILOSEC) 20 MG capsule Take 1 capsule (20 mg total) by mouth daily. On an empty stomach    ondansetron (ZOFRAN) 4 MG tablet TAKE 1 TABLET BY MOUTH EVERY 8 HOURS AS NEEDED FOR NAUSEA AND VOMITING (Patient taking differently: Take 4 mg by mouth every 8 (eight) hours as needed for nausea or vomiting.)    pantoprazole (PROTONIX) 40 MG tablet Take 1 tablet (40 mg total) by mouth 2 (two) times daily.    polyethylene glycol (MIRALAX / GLYCOLAX) 17 g packet Take 17 g by mouth daily as needed for moderate constipation or mild constipation.    QUEtiapine (SEROQUEL) 200 MG tablet Take 1 tablet (200 mg total) by mouth at bedtime. For sleep and for anxiety 04/18/2022: Pt states it was increased recently and has been feeling like it was too much (felt bad the next day) plans to talk to the dr about it   rOPINIRole (REQUIP) 1 MG tablet Take 1 tablet (1 mg total) by mouth at bedtime. For leg cramps    rosuvastatin  (CRESTOR) 5 MG tablet Take 1 tablet (5 mg total) by mouth daily. For cholesterol (Patient taking differently: Take 5 mg by mouth at bedtime. For cholesterol)    Semaglutide,0.25 or 0.5MG /DOS, (OZEMPIC, 0.25 OR 0.5 MG/DOSE,) 2 MG/1.5ML SOPN Inject 0.5 mg into the skin every Friday.    ticagrelor (BRILINTA) 90 MG TABS tablet Take 1 tablet (90 mg total) by mouth 2 (two) times daily.    No facility-administered encounter medications on file as of 06/09/2022.    Patient Active Problem List   Diagnosis Date Noted   Symptomatic anemia 05/30/2022   GI bleed 05/30/2022   Acute respiratory failure with hypoxia (HCC) 04/26/2022   Sepsis ([place]) 04/26/2022   PAF (paroxysmal atrial fibrillation) ([place]) 04/26/2022   Multifocal pneumonia 04/25/2022   Dyslipidemia 04/04/2022   Acute exacerbation of Diastolic CHF (congestive heart failure) ([place]) 03/26/2022   Physical deconditioning 03/09/2022   Hyponatremia 03/09/2022   Pulmonary hypertension (  [place]) 03/06/2022   Hypokalemia 03/05/2022   Atrial fibrillation with RVR ([place]) 03/04/2022   Change in bowel habit 12/16/2021   Colon cancer screening 12/16/2021   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   Family history of colonic polyps 12/16/2021   Acute CHF (congestive heart failure) ([place]) 12/06/2021   Leukocytosis 12/06/2021   Elevated brain natriuretic peptide (BNP) level 12/06/2021   Elevated troponin 12/06/2021   GERD (gastroesophageal reflux disease) 12/06/2021   Nausea 08/26/2021   Lumbago of lumbar region with sciatica 07/21/2021   Arthropathy of lumbar facet joint 06/27/2021   Radiculopathy, lumbar region 05/05/2021   Chest pain 02/19/2021   Essential hypertension    Mixed hyperlipidemia    Type 2 diabetes mellitus with hyperglycemia ([place])    Hypoalbuminemia    Bruit 01/05/2020   Educated about COVID-19 virus infection 01/05/2020   End stage renal disease on dialysis due to type 2 diabetes mellitus ([place]) 07/21/2019   Lung nodule 05/01/2018    Prolonged Q-T interval on ECG 09/06/2017   Unstable angina (HCC) 09/06/2017   GAD (generalized anxiety disorder) 03/04/2016   Leg pain 01/07/2016   Anemia of chronic disease/ESRD 12/10/2015   Dependence on renal dialysis ([place]) 12/10/2015   ESRD (end stage renal disease) on dialysis ([place]) 12/10/2015   Osteopathy in diseases classified elsewhere, unspecified site 12/10/2015   Constipation 10/25/2015   COPD (chronic obstructive pulmonary disease) ([place]) 03/06/2015   Pericardial effusion 03/06/2015   Chronic diastolic CHF (congestive heart failure) ([place]) 03/05/2015   PAD (peripheral artery disease) ([place]) 04/21/2014   Dyspnea 09/29/2013   Obesity (BMI 30.0-34.9) 12/05/2011   Diverticulosis 03/29/2011   Diabetic neuropathy ([place]) 03/29/2011   Esophageal stricture 03/29/2011   Vitamin D deficiency 03/29/2011   Degenerative joint disease of left shoulder 03/29/2011   Neck pain 03/29/2011   Type II diabetes mellitus with nephropathy ([address]) 04/19/2009   Hyperlipidemia associated with type 2 diabetes mellitus ([place]) 04/19/2009   Hypertension associated with diabetes ([place]) 04/19/2009   Coronary artery disease of native artery of native heart with stable angina pectoris ([place]) 04/19/2009    Conditions to be addressed/monitored:Atrial Fibrillation and ESRD  Care Plan : [facility] Care Plan  Updates made by Ilean China, RN since 06/09/2022 12:00 AM     Problem: Chronic Disease Management Needs   Priority: High  Onset Date: 06/09/2022     Long-Range Goal: Patient Will Work With RN Care Manager Regarding Care Management and [place] with AFib and ESRD   Start Date: 06/09/2022  Expected End Date: 06/10/2023  This Visit's Progress: On track  Priority: High  Note:   Current Barriers:  Chronic Disease Management support and education needs related to Atrial Fibrillation and ESRD  RNCM Clinical Goal(s):  Patient will continue to work with RN Care Manager and/or Social Worker to address  care management and care coordination needs related to Atrial Fibrillation and ESRD as evidenced by adherence to CM Team Scheduled appointments     through collaboration with RN Care manager, provider, and care team.   Interventions: 1:1 collaboration with primary care provider regarding development and update of comprehensive plan of care as evidenced by provider attestation and co-signature Inter-disciplinary care team collaboration (see longitudinal plan of care) Evaluation of current treatment plan related to  self management and patient's adherence to plan as established by provider   A-fib:  (Status: New goal.) Long Term Goal  Counseled on increased risk of stroke due to Afib and benefits of anticoagulation for stroke prevention  Reviewed importance of adherence to anticoagulant exactly as prescribed Advised patient to discuss symptomatic Afib with provider Counseled on bleeding risk associated with Eliquis and importance of self-monitoring for signs/symptoms of bleeding Counseled on avoidance of NSAIDs due to increased bleeding risk with anticoagulants Counseled on seeking medical attention after a head injury or if there is blood in the urine/stool Assessed social determinant of health barriers Reviewed recent hospital and ED notes and discussed with patient. Patient went to ED yesterday for symptomatic Afib but left without being fully evaluated because of the long wait.  Asked patient to check heart rate while on the phone with me. Her heart rate varied from 42 to >100 Reviewed upcoming appointments with cardiology and PCP Collaborated with cardiology office [phone] and requested that patient be put on a cancellation list so that she can get a hosp follow-up appointment sooner than is scheduled Also left a message asking cardiology nurse to call patient and discuss symptomatic afib Advised patient to monitor symptoms and heart rate and to seek appropriate medial attention  if needed  Patient Goals/Self-Care Activities: Take medications as prescribed   Call provider office for new concerns or questions  Monitor heart rate and talk with cardiologist [phone] if you have any new or worsening symptoms Call [place] as needed [phone]  Plan:Telephone follow up appointment with care management team member scheduled for:  06/12/22 with RNCM The patient has been provided with contact information for the care management team and has been advised to call with any health related questions or concerns.   Chong Sicilian, BSN, RN-BC Embedded Chronic Care Manager [facility] [place] Management Direct Dial: [phone]

## 2022-06-09 NOTE — ED Provider Notes (Signed)
[facility] EMERGENCY DEPT Provider Note   CSN: [id number] Arrival date & time: 06/09/22  1755     History  Chief Complaint  Patient presents with   Palpitations   Weakness    [name] is a 71 y.o. female.  Presented to the emergency room due to concern for palpitations, weakness, fatigue.  States that she has been feeling generally unwell for quite some time.  Was not feeling well when she was discharged from the hospital a few days ago.  States that her symptoms do not seem to be improving.  She did have a dark black bowel movement yesterday.  No bowel movement today.  No nausea or vomiting.  Denies chest pain but does have palpitations.  HPI     Home Medications Prior to Admission medications   Medication Sig Start Date End Date Taking? Authorizing Provider  acetaminophen (TYLENOL) 500 MG tablet Take 1,000 mg by mouth every 6 (six) hours as needed for moderate pain or headache.    [provider]  albuterol (PROVENTIL) (2.5 MG/3ML) 0.083% nebulizer solution Take 3 mLs (2.5 mg total) by nebulization every 6 (six) hours as needed for wheezing or shortness of breath. 12/02/21   [name], MD  albuterol (VENTOLIN HFA) 108 (90 Base) MCG/ACT inhaler Inhale 2 puffs into the lungs every 6 (six) hours as needed for wheezing or shortness of breath. 12/02/21   [name], MD  apixaban (ELIQUIS) 5 MG TABS tablet Take 1 tablet (5 mg total) by mouth 2 (two) times daily. 04/05/22   Minus Breeding, MD  atenolol (TENORMIN) 100 MG tablet Take 1 tablet (100 mg total) by mouth at bedtime. 05/10/22   [name], MD  budesonide-formoterol (SYMBICORT) 160-4.5 MCG/ACT inhaler Inhale 2 puffs into the lungs 2 (two) times daily. 05/03/22   [name], MD  calcium acetate (PHOSLO) 667 MG capsule Take 667-1,334 mg by mouth See admin instructions. Take 1334 mg with meals 3 times daily and 667 mg with snacks    [provider]  Carboxymethylcellul-Glycerin  (LUBRICATING EYE DROPS OP) Place 1 drop into both eyes daily as needed (dry eyes).    [provider]  cetirizine (ZYRTEC) 10 MG tablet Take 1 tablet (10 mg total) by mouth daily as needed for allergies. Patient taking differently: Take 10 mg by mouth daily. 02/08/22   [name], MD  Continuous Blood Gluc Receiver (FREESTYLE LIBRE 2 READER) DEVI USE TO TEST BLOOD SUGAR CONTINUOUSLY 11/15/21   [name], MD  Continuous Blood Gluc Sensor (FREESTYLE LIBRE 2 SENSOR) MISC Use multiple times daily to track glucose to prevent highs and lows as a complication of dialysis Dx:Z99.2, E11.59 09/15/21   [name], MD  dextromethorphan-guaiFENesin (MUCINEX DM) 30-600 MG 12hr tablet Take 1 tablet by mouth 2 (two) times daily. Patient not taking: Reported on 05/30/2022 04/28/22   Barton Dubois, MD  diltiazem (CARDIZEM CD) 240 MG 24 hr capsule Take 1 capsule (240 mg total) by mouth daily. 05/17/22   Minus Breeding, MD  gabapentin (NEURONTIN) 400 MG capsule Take 2 capsules (800 mg total) by mouth See admin instructions. Take it after HD Patient taking differently: Take 800 mg by mouth See admin instructions. Take after dialysis 03/22/22   [name], MD  glucose blood (ONETOUCH ULTRA) test strip Use to test blood sugar 3 times daily as directed. DX: E11.9 09/13/20   [name], MD  hydrOXYzine (ATARAX) 50 MG tablet Take 1 tablet (50 mg total) by mouth 3 (three) times daily  as needed for anxiety. 03/22/22   [name], MD  insulin aspart (NOVOLOG FLEXPEN) 100 UNIT/ML FlexPen Inject 1 Units into the skin 3 (three) times daily with meals. < 150: 0 units. 150-199:1 unit. 200-249: 2 units. 250-299: 3 units. 300-349: 4 units. Over 350: 5 units 05/19/22   Rakes, Connye Burkitt, FNP  insulin degludec (TRESIBA FLEXTOUCH) 100 UNIT/ML FlexTouch Pen Inject 20 Units into the skin at bedtime. Via novo nordisk patient assistance program Patient taking differently: Inject 30 Units into the skin at bedtime. Via  novo nordisk patient assistance program 05/03/22   [name], MD  lidocaine-prilocaine (EMLA) cream Apply 1 application. topically as needed (dialysis).    [provider]  multivitamin (RENA-VIT) TABS tablet Take 1 tablet by mouth daily. 05/13/22   [provider]  nitroGLYCERIN (NITROSTAT) 0.4 MG SL tablet Place 1 tablet (0.4 mg total) under the tongue every 5 (five) minutes as needed for chest pain. 11/09/21   Minus Breeding, MD  omeprazole (PRILOSEC) 20 MG capsule Take 1 capsule (20 mg total) by mouth daily. On an empty stomach 05/10/22   [name], MD  ondansetron (ZOFRAN) 4 MG tablet TAKE 1 TABLET BY MOUTH EVERY 8 HOURS AS NEEDED FOR NAUSEA AND VOMITING Patient taking differently: Take 4 mg by mouth every 8 (eight) hours as needed for nausea or vomiting. 05/19/22   Baruch Gouty, FNP  pantoprazole (PROTONIX) 40 MG tablet Take 1 tablet (40 mg total) by mouth 2 (two) times daily. 06/04/22 08/03/22  Manuella Ghazi, Pratik D, DO  polyethylene glycol (MIRALAX / GLYCOLAX) 17 g packet Take 17 g by mouth daily as needed for moderate constipation or mild constipation. 03/10/22   Pokhrel, Corrie Mckusick, MD  QUEtiapine (SEROQUEL) 200 MG tablet Take 1 tablet (200 mg total) by mouth at bedtime. For sleep and for anxiety 03/17/22   [name], MD  rOPINIRole (REQUIP) 1 MG tablet Take 1 tablet (1 mg total) by mouth at bedtime. For leg cramps 09/15/21   [name], MD  rosuvastatin (CRESTOR) 5 MG tablet Take 1 tablet (5 mg total) by mouth daily. For cholesterol Patient taking differently: Take 5 mg by mouth at bedtime. For cholesterol 12/12/21   [name], MD  Semaglutide,0.25 or 0.5MG /DOS, (OZEMPIC, 0.25 OR 0.5 MG/DOSE,) 2 MG/1.5ML SOPN Inject 0.5 mg into the skin every Friday.    [provider]  ticagrelor (BRILINTA) 90 MG TABS tablet Take 1 tablet (90 mg total) by mouth 2 (two) times daily. 06/04/22 07/04/22  Manuella Ghazi, Pratik D, DO      Allergies    Azithromycin, Ace inhibitors, Clopidogrel  bisulfate, Codeine, Lisinopril, and Penicillins    Review of Systems   Review of Systems  Constitutional:  Positive for fatigue. Negative for chills and fever.  HENT:  Negative for ear pain and sore throat.   Eyes:  Negative for pain and visual disturbance.  Respiratory:  Positive for chest tightness and shortness of breath. Negative for cough.   Cardiovascular:  Positive for palpitations. Negative for chest pain.  Gastrointestinal:  Positive for blood in stool. Negative for abdominal pain and vomiting.  Genitourinary:  Negative for dysuria and hematuria.  Musculoskeletal:  Negative for arthralgias and back pain.  Skin:  Negative for color change and rash.  Neurological:  Negative for seizures and syncope.  All other systems reviewed and are negative.   Physical Exam Updated Vital Signs BP (!) 140/58   Pulse 92   Temp 97.6 F (36.4 C) (Oral)   Resp (!) 22  Ht 5\' 3"  (1.6 m)   Wt 80.7 kg   SpO2 97%   BMI 31.53 kg/m  Physical Exam Vitals and nursing note reviewed. Exam conducted with a chaperone present.  Constitutional:      General: She is not in acute distress.    Appearance: She is well-developed.  HENT:     Head: Normocephalic and atraumatic.  Eyes:     Conjunctiva/sclera: Conjunctivae normal.  Cardiovascular:     Rate and Rhythm: Normal rate and regular rhythm.     Heart sounds: No murmur heard. Pulmonary:     Effort: Pulmonary effort is normal. No respiratory distress.     Breath sounds: Normal breath sounds.  Abdominal:     Palpations: Abdomen is soft.     Tenderness: There is no abdominal tenderness.  Genitourinary:    Comments: Stool black Musculoskeletal:        General: No swelling.     Cervical back: Neck supple.  Skin:    General: Skin is warm and dry.     Capillary Refill: Capillary refill takes less than 2 seconds.  Neurological:     Mental Status: She is alert.  Psychiatric:        Mood and Affect: Mood normal.     ED Results / Procedures /  Treatments   Labs (all labs ordered are listed, but only abnormal results are displayed) Labs Reviewed  CBC WITH DIFFERENTIAL/PLATELET - Abnormal; Notable for the following components:      Result Value   RBC 3.04 (*)    Hemoglobin 8.3 (*)    HCT 25.9 (*)    RDW 17.2 (*)    Platelets 496 (*)    nRBC 0.6 (*)    Abs Immature Granulocytes 0.08 (*)    All other components within normal limits  BASIC METABOLIC PANEL - Abnormal; Notable for the following components:   Potassium 3.2 (*)    Chloride 93 (*)    Glucose, Bld 224 (*)    BUN 38 (*)    Creatinine, Ser 6.15 (*)    GFR, Estimated 7 (*)    All other components within normal limits  HEPATIC FUNCTION PANEL - Abnormal; Notable for the following components:   Total Protein 5.9 (*)    Albumin 3.2 (*)    All other components within normal limits  OCCULT BLOOD X 1 CARD TO LAB, STOOL - Abnormal; Notable for the following components:   Fecal Occult Bld POSITIVE (*)    All other components within normal limits  CBG MONITORING, ED - Abnormal; Notable for the following components:   Glucose-Capillary 231 (*)    All other components within normal limits  TROPONIN I (HIGH SENSITIVITY) - Abnormal; Notable for the following components:   Troponin I (High Sensitivity) 33 (*)    All other components within normal limits  TROPONIN I (HIGH SENSITIVITY)    EKG None  Radiology DG Chest Portable 1 View  Result Date: 06/09/2022 CLINICAL DATA:  Worsening shortness of breath. EXAM: PORTABLE CHEST 1 VIEW COMPARISON:  Radiograph yesterday.  CT 05/15/2022 FINDINGS: Cardiomegaly. Similar central vascular congestion allowing for differences in technique. No convincing pulmonary edema. Increasing atelectasis at the left lung base. No large pleural effusion or pneumothorax. Stable osseous structures. IMPRESSION: 1. Increasing atelectasis at the left lung base. 2. Cardiomegaly and central vascular congestion, unchanged. Electronically Signed   By: Keith Rake M.D.   On: 06/09/2022 21:02   DG Chest 2 View  Result Date: 06/08/2022 CLINICAL  DATA:  Shortness of breath EXAM: CHEST - 2 VIEW COMPARISON:  05/30/2022 FINDINGS: Unchanged mild cardiomegaly with pulmonary vascular congestion. No overt pulmonary edema or focal airspace consolidation. No pleural effusion. IMPRESSION: Cardiomegaly and pulmonary vascular congestion without overt pulmonary edema. Electronically Signed   By: Ulyses Jarred M.D.   On: 06/08/2022 19:32    Procedures Procedures    Medications Ordered in ED Medications  albuterol (VENTOLIN HFA) 108 (90 Base) MCG/ACT inhaler 2 puff (2 puffs Inhalation Given 06/09/22 2035)  pantoprazole (PROTONIX) injection 40 mg (has no administration in time range)  aerochamber plus with mask device 1 each (1 each Other Given 06/09/22 2035)    ED Course/ Medical Decision Making/ A&P                           Medical Decision Making Amount and/or Complexity of Data Reviewed Labs: ordered. Radiology: ordered.  Risk Prescription drug management. Decision regarding hospitalization.   71 year old lady presents to ER due to concern for palpitations, shortness of breath, fatigue, malaise.  On exam she is well-appearing in no distress.  She did endorse having a dark stool yesterday.  I will check Hemoccult and it was positive, I did appreciate dark, black stool on rectal exam.  Concern for ongoing GI bleed.  Her hemoglobin has dropped slightly today from yesterday.  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  Patient is on anticoagulation and antiplatelet agent.  Believe she would benefit from admission at this time.  Discussed with Dr. Sid Falcon or who will accept.  Anticipate patient will need to go to [facility] because she is a dialysis patient.  I have sent secure chat message to Dr. Havery Moros to request [facility] consult on patient tomorrow am.  Patient is hemodynamically  stable, has not had any bowel movements since being in ER.  EKG at baseline, no acute ischemic change, troponin mildly elevated but improved from prior, doubt ACS.  Slight hypokalemia, no other electrolyte derangement.        Final Clinical Impression(s) / ED Diagnoses Final diagnoses:  None    Rx / DC Orders ED Discharge Orders     None         Lucrezia Starch, MD 06/10/22 0003

## 2022-06-09 NOTE — Telephone Encounter (Signed)
Spoke with patient of Dr. Percival Baker - advised [name] from PCP office called in on her behalf. She said with her AFib, she feels pressure, feels her heart speeding up/going down. She has no energy. She reports low Hgb. She went to [facility] ED last night and she left - labs, EKG, x-ray were done. She said they didn't give her nothing to feel better. She has dialysis tomorrow. She also reports she plans to go to [facility] ED at [place] today.   Advised will notify her MD Will message [name] to see if sooner appt with [facility] is available

## 2022-06-09 NOTE — ED Triage Notes (Signed)
"  Palpitations and weakness since April on and off with low blood levels and a-fib, just got more weak today and don't have any energy to hardly get to dialyisis" per pt

## 2022-06-09 NOTE — Patient Instructions (Signed)
Visit Information  Thank you for taking time to visit with me today. Please don't hesitate to contact me if I can be of assistance to you before our next scheduled telephone appointment.  Following are the goals we discussed today:  Take medications as prescribed   Call provider office for new concerns or questions  Monitor heart rate and talk with cardiologist [phone] if you have any new or worsening symptoms Call RN Care Manager as needed [phone]  Our next appointment is by telephone on 06/12/22 at 10:30  Please call the care guide team at [phone] if you need to cancel or reschedule your appointment.   If you are experiencing a Mental Health or [place] or need someone to talk to, please call the [facility]: [phone] call [phone]   Patient verbalizes understanding of instructions and care plan provided today and agrees to view in [place]. Active MyChart status and patient understanding of how to access instructions and care plan via MyChart confirmed with patient.     Chong Sicilian, BSN, RN-BC Embedded Chronic Care Manager [facility] [place] [facility] Direct Dial: [phone]

## 2022-06-09 NOTE — ED Notes (Signed)
Called patient to retake V/S, no answer.

## 2022-06-10 DIAGNOSIS — Z9861 Coronary angioplasty status: Secondary | ICD-10-CM

## 2022-06-10 DIAGNOSIS — E559 Vitamin D deficiency, unspecified: Secondary | ICD-10-CM | POA: Diagnosis present

## 2022-06-10 DIAGNOSIS — D62 Acute posthemorrhagic anemia: Secondary | ICD-10-CM

## 2022-06-10 DIAGNOSIS — N186 End stage renal disease: Secondary | ICD-10-CM

## 2022-06-10 DIAGNOSIS — I509 Heart failure, unspecified: Secondary | ICD-10-CM | POA: Diagnosis not present

## 2022-06-10 DIAGNOSIS — E1140 Type 2 diabetes mellitus with diabetic neuropathy, unspecified: Secondary | ICD-10-CM | POA: Diagnosis present

## 2022-06-10 DIAGNOSIS — D500 Iron deficiency anemia secondary to blood loss (chronic): Secondary | ICD-10-CM | POA: Diagnosis not present

## 2022-06-10 DIAGNOSIS — J449 Chronic obstructive pulmonary disease, unspecified: Secondary | ICD-10-CM | POA: Diagnosis present

## 2022-06-10 DIAGNOSIS — I10 Essential (primary) hypertension: Secondary | ICD-10-CM

## 2022-06-10 DIAGNOSIS — F32A Depression, unspecified: Secondary | ICD-10-CM | POA: Diagnosis present

## 2022-06-10 DIAGNOSIS — I5032 Chronic diastolic (congestive) heart failure: Secondary | ICD-10-CM

## 2022-06-10 DIAGNOSIS — K921 Melena: Secondary | ICD-10-CM | POA: Diagnosis present

## 2022-06-10 DIAGNOSIS — I5082 Biventricular heart failure: Secondary | ICD-10-CM | POA: Diagnosis present

## 2022-06-10 DIAGNOSIS — I5030 Unspecified diastolic (congestive) heart failure: Secondary | ICD-10-CM | POA: Diagnosis not present

## 2022-06-10 DIAGNOSIS — K219 Gastro-esophageal reflux disease without esophagitis: Secondary | ICD-10-CM | POA: Diagnosis present

## 2022-06-10 DIAGNOSIS — Z7901 Long term (current) use of anticoagulants: Secondary | ICD-10-CM | POA: Diagnosis not present

## 2022-06-10 DIAGNOSIS — D631 Anemia in chronic kidney disease: Secondary | ICD-10-CM | POA: Diagnosis present

## 2022-06-10 DIAGNOSIS — E1122 Type 2 diabetes mellitus with diabetic chronic kidney disease: Secondary | ICD-10-CM | POA: Diagnosis present

## 2022-06-10 DIAGNOSIS — I2510 Atherosclerotic heart disease of native coronary artery without angina pectoris: Secondary | ICD-10-CM

## 2022-06-10 DIAGNOSIS — I2781 Cor pulmonale (chronic): Secondary | ICD-10-CM | POA: Diagnosis present

## 2022-06-10 DIAGNOSIS — N250 Renal osteodystrophy: Secondary | ICD-10-CM | POA: Diagnosis not present

## 2022-06-10 DIAGNOSIS — E1169 Type 2 diabetes mellitus with other specified complication: Secondary | ICD-10-CM | POA: Diagnosis present

## 2022-06-10 DIAGNOSIS — E876 Hypokalemia: Secondary | ICD-10-CM | POA: Diagnosis present

## 2022-06-10 DIAGNOSIS — N2581 Secondary hyperparathyroidism of renal origin: Secondary | ICD-10-CM | POA: Diagnosis present

## 2022-06-10 DIAGNOSIS — D649 Anemia, unspecified: Secondary | ICD-10-CM | POA: Diagnosis not present

## 2022-06-10 DIAGNOSIS — E785 Hyperlipidemia, unspecified: Secondary | ICD-10-CM | POA: Diagnosis not present

## 2022-06-10 DIAGNOSIS — F419 Anxiety disorder, unspecified: Secondary | ICD-10-CM | POA: Diagnosis not present

## 2022-06-10 DIAGNOSIS — R778 Other specified abnormalities of plasma proteins: Secondary | ICD-10-CM

## 2022-06-10 DIAGNOSIS — Z992 Dependence on renal dialysis: Secondary | ICD-10-CM

## 2022-06-10 DIAGNOSIS — K922 Gastrointestinal hemorrhage, unspecified: Secondary | ICD-10-CM

## 2022-06-10 DIAGNOSIS — I132 Hypertensive heart and chronic kidney disease with heart failure and with stage 5 chronic kidney disease, or end stage renal disease: Secondary | ICD-10-CM | POA: Diagnosis present

## 2022-06-10 DIAGNOSIS — G4709 Other insomnia: Secondary | ICD-10-CM | POA: Diagnosis present

## 2022-06-10 DIAGNOSIS — I4821 Permanent atrial fibrillation: Secondary | ICD-10-CM

## 2022-06-10 DIAGNOSIS — E1151 Type 2 diabetes mellitus with diabetic peripheral angiopathy without gangrene: Secondary | ICD-10-CM | POA: Diagnosis present

## 2022-06-10 DIAGNOSIS — D6832 Hemorrhagic disorder due to extrinsic circulating anticoagulants: Secondary | ICD-10-CM | POA: Diagnosis present

## 2022-06-10 DIAGNOSIS — R410 Disorientation, unspecified: Secondary | ICD-10-CM | POA: Diagnosis not present

## 2022-06-10 DIAGNOSIS — E1165 Type 2 diabetes mellitus with hyperglycemia: Secondary | ICD-10-CM | POA: Diagnosis present

## 2022-06-10 LAB — CBC
HCT: 23.9 % — ABNORMAL LOW (ref 36.0–46.0)
Hemoglobin: 7.9 g/dL — ABNORMAL LOW (ref 12.0–15.0)
MCH: 28 pg (ref 26.0–34.0)
MCHC: 33.1 g/dL (ref 30.0–36.0)
MCV: 84.8 fL (ref 80.0–100.0)
Platelets: 443 10*3/uL — ABNORMAL HIGH (ref 150–400)
RBC: 2.82 MIL/uL — ABNORMAL LOW (ref 3.87–5.11)
RDW: 17.2 % — ABNORMAL HIGH (ref 11.5–15.5)
WBC: 10.9 10*3/uL — ABNORMAL HIGH (ref 4.0–10.5)
nRBC: 0.6 % — ABNORMAL HIGH (ref 0.0–0.2)

## 2022-06-10 LAB — RENAL FUNCTION PANEL
Albumin: 2.3 g/dL — ABNORMAL LOW (ref 3.5–5.0)
Anion gap: 19 — ABNORMAL HIGH (ref 5–15)
BUN: 41 mg/dL — ABNORMAL HIGH (ref 8–23)
CO2: 24 mmol/L (ref 22–32)
Calcium: 8.9 mg/dL (ref 8.9–10.3)
Chloride: 94 mmol/L — ABNORMAL LOW (ref 98–111)
Creatinine, Ser: 7.58 mg/dL — ABNORMAL HIGH (ref 0.44–1.00)
GFR, Estimated: 5 mL/min — ABNORMAL LOW (ref >60)
Glucose, Bld: 127 mg/dL — ABNORMAL HIGH (ref 70–99)
Phosphorus: 4.8 mg/dL — ABNORMAL HIGH (ref 2.5–4.6)
Potassium: 3.4 mmol/L — ABNORMAL LOW (ref 3.5–5.1)
Sodium: 137 mmol/L (ref 135–145)

## 2022-06-10 LAB — HEPATITIS C ANTIBODY: HCV Ab: NONREACTIVE

## 2022-06-10 LAB — TYPE AND SCREEN
ABO/RH(D): B NEG
Antibody Screen: NEGATIVE

## 2022-06-10 LAB — HEPATITIS B SURFACE ANTIGEN: Hepatitis B Surface Ag: NONREACTIVE

## 2022-06-10 LAB — MAGNESIUM: Magnesium: 2 mg/dL (ref 1.7–2.4)

## 2022-06-10 LAB — HEPATITIS B CORE ANTIBODY, TOTAL: Hep B Core Total Ab: NONREACTIVE

## 2022-06-10 LAB — HEMOGLOBIN AND HEMATOCRIT, BLOOD
HCT: 28.4 % — ABNORMAL LOW (ref 36.0–46.0)
Hemoglobin: 9 g/dL — ABNORMAL LOW (ref 12.0–15.0)

## 2022-06-10 LAB — TROPONIN I (HIGH SENSITIVITY): Troponin I (High Sensitivity): 46 ng/L — ABNORMAL HIGH (ref <18)

## 2022-06-10 LAB — GLUCOSE, CAPILLARY: Glucose-Capillary: 223 mg/dL — ABNORMAL HIGH (ref 70–99)

## 2022-06-10 LAB — HEPATITIS B SURFACE ANTIBODY,QUALITATIVE: Hep B S Ab: REACTIVE — AB

## 2022-06-10 MED ORDER — HYDROXYZINE HCL 25 MG PO TABS
50.0000 mg | ORAL_TABLET | Freq: Three times a day (TID) | ORAL | Status: DC | PRN
Start: 2022-06-10 — End: 2022-06-14
  Administered 2022-06-10 – 2022-06-12 (×3): 50 mg via ORAL
  Filled 2022-06-10 (×3): qty 2

## 2022-06-10 MED ORDER — CALCIUM ACETATE (PHOS BINDER) 667 MG PO CAPS
667.0000 mg | ORAL_CAPSULE | ORAL | Status: DC
Start: 2022-06-11 — End: 2022-06-15
  Administered 2022-06-11 – 2022-06-14 (×3): 667 mg via ORAL
  Filled 2022-06-10 (×3): qty 1

## 2022-06-10 MED ORDER — QUETIAPINE FUMARATE 100 MG PO TABS
200.0000 mg | ORAL_TABLET | Freq: Every day | ORAL | Status: DC
  Administered 2022-06-10 – 2022-06-12 (×3): 200 mg via ORAL
  Filled 2022-06-10 (×4): qty 2

## 2022-06-10 MED ORDER — LIDOCAINE HCL (PF) 1 % IJ SOLN: 5.0000 mL | INTRAMUSCULAR | Status: DC | PRN

## 2022-06-10 MED ORDER — ACETAMINOPHEN 325 MG PO TABS
650.0000 mg | ORAL_TABLET | Freq: Four times a day (QID) | ORAL | Status: DC | PRN
  Filled 2022-06-10: qty 2

## 2022-06-10 MED ORDER — LORATADINE 10 MG PO TABS: 10.0000 mg | ORAL_TABLET | Freq: Every day | ORAL | Status: DC | PRN

## 2022-06-10 MED ORDER — CHLORHEXIDINE GLUCONATE CLOTH 2 % EX PADS
6.0000 | MEDICATED_PAD | Freq: Every day | CUTANEOUS | Status: DC
  Administered 2022-06-10 – 2022-06-11 (×2): 6 via TOPICAL

## 2022-06-10 MED ORDER — RENA-VITE PO TABS
1.0000 | ORAL_TABLET | Freq: Every day | ORAL | Status: DC
  Administered 2022-06-11 – 2022-06-15 (×5): 1 via ORAL
  Filled 2022-06-10 (×5): qty 1

## 2022-06-10 MED ORDER — PENTAFLUOROPROP-TETRAFLUOROETH EX AERO: 1.0000 | INHALATION_SPRAY | CUTANEOUS | Status: DC | PRN

## 2022-06-10 MED ORDER — GABAPENTIN 400 MG PO CAPS
800.0000 mg | ORAL_CAPSULE | ORAL | Status: DC
  Administered 2022-06-13: 800 mg via ORAL
  Filled 2022-06-10: qty 2

## 2022-06-10 MED ORDER — LIDOCAINE-PRILOCAINE 2.5-2.5 % EX CREA
1.0000 | TOPICAL_CREAM | CUTANEOUS | Status: DC | PRN
Start: 2022-06-10 — End: 2022-06-10

## 2022-06-10 MED ORDER — CARBOXYMETHYLCELLUL-GLYCERIN 0.5-0.9 % OP SOLN: 1.0000 [drp] | Freq: Every day | OPHTHALMIC | Status: DC | PRN

## 2022-06-10 MED ORDER — ROPINIROLE HCL 1 MG PO TABS
1.0000 mg | ORAL_TABLET | Freq: Every day | ORAL | Status: DC
  Administered 2022-06-10 – 2022-06-12 (×3): 1 mg via ORAL
  Filled 2022-06-10 (×4): qty 1

## 2022-06-10 MED ORDER — ACETAMINOPHEN 650 MG RE SUPP: 650.0000 mg | Freq: Four times a day (QID) | RECTAL | Status: DC | PRN

## 2022-06-10 MED ORDER — INSULIN GLARGINE-YFGN 100 UNIT/ML ~~LOC~~ SOLN
10.0000 [IU] | Freq: Every day | SUBCUTANEOUS | Status: DC
Start: 2022-06-10 — End: 2022-06-15
  Administered 2022-06-10 – 2022-06-14 (×5): 10 [IU] via SUBCUTANEOUS
  Filled 2022-06-10 (×6): qty 0.1

## 2022-06-10 MED ORDER — DILTIAZEM HCL ER COATED BEADS 240 MG PO CP24
240.0000 mg | ORAL_CAPSULE | Freq: Every day | ORAL | Status: DC
  Administered 2022-06-11 – 2022-06-15 (×5): 240 mg via ORAL
  Filled 2022-06-10 (×5): qty 1

## 2022-06-10 MED ORDER — ALBUTEROL SULFATE (2.5 MG/3ML) 0.083% IN NEBU: 2.5000 mg | INHALATION_SOLUTION | Freq: Four times a day (QID) | RESPIRATORY_TRACT | Status: DC | PRN

## 2022-06-10 MED ORDER — MOMETASONE FURO-FORMOTEROL FUM 200-5 MCG/ACT IN AERO
2.0000 | INHALATION_SPRAY | Freq: Two times a day (BID) | RESPIRATORY_TRACT | Status: DC
  Administered 2022-06-10 – 2022-06-14 (×7): 2 via RESPIRATORY_TRACT
  Filled 2022-06-10: qty 8.8

## 2022-06-10 MED ORDER — SODIUM CHLORIDE 0.9% FLUSH
3.0000 mL | Freq: Two times a day (BID) | INTRAVENOUS | Status: DC
  Administered 2022-06-10 – 2022-06-15 (×10): 3 mL via INTRAVENOUS

## 2022-06-10 MED ORDER — ROSUVASTATIN CALCIUM 5 MG PO TABS
5.0000 mg | ORAL_TABLET | Freq: Every day | ORAL | Status: DC
Start: 2022-06-10 — End: 2022-06-15
  Administered 2022-06-10 – 2022-06-14 (×4): 5 mg via ORAL
  Filled 2022-06-10 (×5): qty 1

## 2022-06-10 MED ORDER — LIDOCAINE-PRILOCAINE 2.5-2.5 % EX CREA
1.0000 | TOPICAL_CREAM | CUTANEOUS | Status: DC | PRN
Start: 2022-06-10 — End: 2022-06-15

## 2022-06-10 MED ORDER — ATENOLOL 100 MG PO TABS
100.0000 mg | ORAL_TABLET | Freq: Every day | ORAL | Status: DC
  Administered 2022-06-10 – 2022-06-14 (×4): 100 mg via ORAL
  Filled 2022-06-10 (×6): qty 1

## 2022-06-10 MED ORDER — CALCIUM ACETATE (PHOS BINDER) 667 MG PO CAPS
1334.0000 mg | ORAL_CAPSULE | Freq: Three times a day (TID) | ORAL | Status: DC
  Administered 2022-06-11 – 2022-06-14 (×8): 1334 mg via ORAL
  Filled 2022-06-10 (×9): qty 2

## 2022-06-10 NOTE — Consult Note (Signed)
Cardiology Consultation:   Patient ID: [name] MRN: [id number]; DOB: Apr 21, 1951  Admit date: 06/09/2022 Date of Consult: 06/10/2022  PCP:  [name], MD   [facility] [facility] Providers Cardiologist:  Minus Breeding, MD   {   Patient Profile:   [name] is a 71 y.o. female with a hx of CAD with multiple stents, paroxysmal A-fib on Eliquis, ESRD, chronic diastolic heart failure, COPD, GERD, poorly controlled diabetes, hypertension, hyperlipidemia, peripheral vascular disease, CVA, who is being seen 06/10/2022 for the evaluation of adjustment of antiplatelet therapy at the request of Dr. Tamala Julian.  History of Present Illness:   [name] with above past medical history presented to the ER yesterday complaining heart palpitation, weakness, and fatigue. She was recently hospitalized here 05/30/2022 to 06/04/2022 chest pain due to symptomatic anemia due to GI bleed.  She required 1 unit PRBC transfusion.  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  She was recommended twice daily PPI.  She had resumed Eliquis as well as Brilinta at the time of discharge.  Hemoglobin was 9.6 on 06/03/2022.  She returned to the ER last night, states feeling generally unwell for the past few days.  She had a black-colored bowel movement yesterday.  Admission diagnostic revealed hypokalemia 3.2, glucose 224, BUN 38, creatinine 6.15, albumin 3.2.  High sensitive troponin 33 >46.  CBC with hemoglobin 8.3 and platelet 496k.  Fecal occult blood positive.  Repeat labs today revealed persistent hypokalemia 3.4 and worsening anemia with hemoglobin 7.9.  Chest x-ray revealed increasing atelectasis of left lung base, unchanged cardiomegaly with central vascular congestion.  Patient is admitted to hospital medicine service, GI is consulted.  Cardiology is consulted regarding antithrombotic therapy.  Past Medical History:  Diagnosis Date   Acute on chronic  diastolic CHF (congestive heart failure) ([place]) 03/05/2015   Grade 2. EF 60-65%   Anemia    hx of   Anxiety    Arthritis    CAD (coronary artery disease)    s/p Stenting in 2005;  [place] 7/09: Vigorous LV function, 2-3+ MR, RI 40%, RI stent patent, proximal-mid RCA 40-50%;  Echo 7/09:  EF 55-65%, trivial MR;  [place] 11/18/12: EF 66%, normal LV wall motion, mild to moderate anterior ischemia - reviewed by [facility] and felt to rep breast atten (low risk);  Echo 6/14: mild LVH, EF 55-65%, Gr 2 DD, PASP 44, trivial eff    Chronic kidney disease    CREATININE IS UP--GOING TO KIDNEY MD    Chronic neck pain    COPD (chronic obstructive pulmonary disease) (HCC)    Degenerative joint disease    left shoulder   Diabetic neuropathy (HCC)    Diverticulosis    Esophageal stricture    GERD (gastroesophageal reflux disease)    Hyperlipidemia    Hypertension    IDDM (insulin dependent diabetes mellitus)    Pericardial effusion 03/06/2015   Very small per echo ; pleural nodules seen on CT chest.   Peripheral vascular disease ([place])    Stroke ([place])    "mini- years ago"   Vitamin D deficiency     Past Surgical History:  Procedure Laterality Date   A/V FISTULAGRAM N/A 08/10/2017   Procedure: A/V Fistulagram - Right Arm;  Surgeon: Elam Dutch, MD;  Location: [place] [facility];  Service: Cardiovascular;  Laterality: N/A;   A/V FISTULAGRAM N/A 03/03/2020   Procedure: A/V FISTULAGRAM - Right Arm;  Surgeon: Carlis Abbott,  Gwenyth Allegra, MD;  Location: [place] [facility];  Service: Cardiovascular;  Laterality: N/A;   ABDOMINAL HYSTERECTOMY     AV FISTULA PLACEMENT Right 07/20/2015   Procedure: RADIAL CEPHALIC ARTERIOVENOUS FISTULA CREATION RIGHT ARM;  Surgeon: Angelia Mould, MD;  Location: [place];  Service: Vascular;  Laterality: Right;   AV FISTULA PLACEMENT Right 11/26/2015   Procedure:  Creation of RIGHT BRACHIO-CEPHALIC arteriovenous fistula;  Surgeon: Angelia Mould, MD;  Location[facility];  Service:  Vascular;  Laterality: Right;   BACK SURGERY     2007 & 2013   [place]  2003, 2005, 2009   1 stent   cardiac stents     CARDIOVERSION N/A 04/24/2022   Procedure: CARDIOVERSION;  Surgeon: Fay Records, MD;  Location: [facility] ENDOSCOPY;  Service: Cardiovascular;  Laterality: N/A;   CATARACT EXTRACTION W/PHACO Right 05/04/2014   Procedure: CATARACT EXTRACTION PHACO AND INTRAOCULAR LENS PLACEMENT ([place]);  Surgeon: Williams Che, MD;  Location: [facility];  Service: Ophthalmology;  Laterality: Right;  CDE 1.47   CATARACT EXTRACTION W/PHACO Left 07/20/2014   Procedure: CATARACT EXTRACTION WITH PHACO AND INTRAOCULAR LENS PLACEMENT LEFT EYE CDE=1.79;  Surgeon: Williams Che, MD;  Location: [facility];  Service: Ophthalmology;  Laterality: Left;   CERVICAL SPINE SURGERY  2012   8 screws   CORONARY ATHERECTOMY N/A 03/08/2022   Procedure: CORONARY ATHERECTOMY;  Surgeon: Sherren Mocha, MD;  Location: [place] [facility];  Service: Cardiovascular;  Laterality: N/A;   CYSTOSCOPY WITH INJECTION N/A 06/03/2013   Procedure: MACROPLASTIQUE  INJECTION ;  Surgeon: Malka So, MD;  Location: [facility];  Service: Urology;  Laterality: N/A;   ESOPHAGEAL DILATION Right 06/02/2022   Procedure: ESOPHAGEAL DILATION;  Surgeon: Harvel Quale, MD;  [facility];  Service: Gastroenterology;  Laterality: Right;   ESOPHAGOGASTRODUODENOSCOPY (EGD) WITH PROPOFOL N/A 06/02/2022   Procedure: ESOPHAGOGASTRODUODENOSCOPY (EGD) WITH PROPOFOL;  Surgeon: Harvel Quale, MD;  [facility];  Service: Gastroenterology;  Laterality: N/A;  TO BE DONE IN OR 2   FISTULOGRAM Right 10/20/2016   Procedure: FISTULOGRAM WITH POSSIBLE INTERVENTION;  Surgeon: [name], MD;  Location: [facility];  Service: Vascular;  Laterality: Right;   HEMOSTASIS CLIP PLACEMENT  06/02/2022   Procedure: HEMOSTASIS CLIP PLACEMENT;  Surgeon: Harvel Quale, MD;  [facility];  Service: Gastroenterology;;   HOT HEMOSTASIS  06/02/2022   Procedure: HOT HEMOSTASIS (ARGON PLASMA COAGULATION/BICAP);  Surgeon: Montez Morita, Quillian Quince, MD;  [facility];  Service: Gastroenterology;;   INSERTION OF DIALYSIS CATHETER N/A 11/26/2015   Procedure: INSERTION OF DIALYSIS CATHETER;  Surgeon: Angelia Mould, MD;  Location: [place];  Service: Vascular;  Laterality: N/A;   INTRAVASCULAR IMAGING/OCT N/A 03/08/2022   Procedure: INTRAVASCULAR IMAGING/OCT;  Surgeon: Sherren Mocha, MD;  Location: [place] [facility];  Service: Cardiovascular;  Laterality: N/A;   LAMINECTOMY  12/29/2011   LEFT HEART CATH AND CORONARY ANGIOGRAPHY N/A 03/06/2022   Procedure: LEFT HEART CATH AND CORONARY ANGIOGRAPHY;  Surgeon: Nelva Bush, MD;  Location: [place] [facility];  Service: Cardiovascular;  Laterality: N/A;   LUMBAR LAMINECTOMY/DECOMPRESSION MICRODISCECTOMY  12/29/2011   Procedure: LUMBAR LAMINECTOMY/DECOMPRESSION MICRODISCECTOMY;  Surgeon: Floyce Stakes, MD;  Location: [place] NEURO ORS;  Service: Neurosurgery;  Laterality: N/A;  Lumbar Two through Lumbar Five Laminectomies/Cellsaver   PARTIAL HYSTERECTOMY     PERIPHERAL VASCULAR BALLOON ANGIOPLASTY  03/03/2020   Procedure: PERIPHERAL  VASCULAR BALLOON ANGIOPLASTY;  Surgeon: Marty Heck, MD;  Location: [place] [facility];  Service: Cardiovascular;;  Rt arm fistula   PERIPHERAL VASCULAR BALLOON ANGIOPLASTY Right 04/15/2021   Procedure: PERIPHERAL VASCULAR BALLOON ANGIOPLASTY;  Surgeon: Angelia Mould, MD;  Location: [place] [facility];  Service: Cardiovascular;  Laterality: Right;  arm fistula   PERIPHERAL VASCULAR BALLOON ANGIOPLASTY  03/14/2022   Procedure: PERIPHERAL VASCULAR BALLOON ANGIOPLASTY;  Surgeon: Broadus John, MD;  Location: [place] [facility];  Service: Cardiovascular;;  right arm AVF   PERIPHERAL VASCULAR CATHETERIZATION N/A 11/22/2015   Procedure: Fistulagram;  Surgeon: Angelia Mould, MD;   Location: [place] [facility];  Service: Cardiovascular;  Laterality: N/A;   PERIPHERAL VASCULAR INTERVENTION  08/10/2017   Procedure: PERIPHERAL VASCULAR INTERVENTION;  Surgeon: Elam Dutch, MD;  Location: [place] [facility];  Service: Cardiovascular;;   SHOULDER SURGERY Right    Rotator cuff     Home Medications:  Prior to Admission medications   Medication Sig Start Date End Date Taking? Authorizing Provider  acetaminophen (TYLENOL) 500 MG tablet Take 1,000 mg by mouth every 6 (six) hours as needed for moderate pain or headache.   Yes [provider]  albuterol (PROVENTIL) (2.5 MG/3ML) 0.083% nebulizer solution Take 3 mLs (2.5 mg total) by nebulization every 6 (six) hours as needed for wheezing or shortness of breath. 12/02/21  Yes [name], MD  albuterol (VENTOLIN HFA) 108 (90 Base) MCG/ACT inhaler Inhale 2 puffs into the lungs every 6 (six) hours as needed for wheezing or shortness of breath. 12/02/21  Yes [name], MD  apixaban (ELIQUIS) 5 MG TABS tablet Take 1 tablet (5 mg total) by mouth 2 (two) times daily. 04/05/22  Yes Minus Breeding, MD  atenolol (TENORMIN) 100 MG tablet Take 1 tablet (100 mg total) by mouth at bedtime. 05/10/22  Yes [name], [name], MD  budesonide-formoterol (SYMBICORT) 160-4.5 MCG/ACT inhaler Inhale 2 puffs into the lungs 2 (two) times daily. 05/03/22  Yes [name], MD  calcium acetate (PHOSLO) 667 MG capsule Take 667-1,334 mg by mouth See admin instructions. Take 1334 mg with meals 3 times daily and 667 mg with snacks   Yes [provider]  Carboxymethylcellul-Glycerin (LUBRICATING EYE DROPS OP) Place 1 drop into both eyes daily as needed (dry eyes).   Yes [provider]  cetirizine (ZYRTEC) 10 MG tablet Take 1 tablet (10 mg total) by mouth daily as needed for allergies. Patient taking differently: Take 10 mg by mouth daily. 02/08/22  Yes [name], MD  diltiazem (CARDIZEM CD) 240 MG 24 hr capsule Take 1 capsule (240  mg total) by mouth daily. 05/17/22  Yes Minus Breeding, MD  gabapentin (NEURONTIN) 400 MG capsule Take 2 capsules (800 mg total) by mouth See admin instructions. Take it after HD Patient taking differently: Take 800 mg by mouth See admin instructions. Take after dialysis 03/22/22  Yes [name], MD  hydrOXYzine (ATARAX) 50 MG tablet Take 1 tablet (50 mg total) by mouth 3 (three) times daily as needed for anxiety. 03/22/22  Yes [name], [name], MD  insulin aspart (NOVOLOG FLEXPEN) 100 UNIT/ML FlexPen Inject 1 Units into the skin 3 (three) times daily with meals. < 150: 0 units. 150-199:1 unit. 200-249: 2 units. 250-299: 3 units. 300-349: 4 units. Over 350: 5 units 05/19/22  Yes Rakes, Connye Burkitt, FNP  insulin degludec (TRESIBA FLEXTOUCH) 100 UNIT/ML FlexTouch Pen Inject 20 Units into the skin at bedtime. Via novo nordisk patient assistance program  Patient taking differently: Inject 30 Units into the skin at bedtime. Via novo nordisk patient assistance program 05/03/22  Yes [name], MD  lidocaine-prilocaine (EMLA) cream Apply 1 application. topically as needed (dialysis).   Yes [provider]  multivitamin (RENA-VIT) TABS tablet Take 1 tablet by mouth daily. 05/13/22  Yes [provider]  nitroGLYCERIN (NITROSTAT) 0.4 MG SL tablet Place 1 tablet (0.4 mg total) under the tongue every 5 (five) minutes as needed for chest pain. 11/09/21  Yes Minus Breeding, MD  omeprazole (PRILOSEC) 20 MG capsule Take 1 capsule (20 mg total) by mouth daily. On an empty stomach 05/10/22  Yes [name], [name], MD  ondansetron (ZOFRAN) 4 MG tablet TAKE 1 TABLET BY MOUTH EVERY 8 HOURS AS NEEDED FOR NAUSEA AND VOMITING Patient taking differently: Take 4 mg by mouth every 8 (eight) hours as needed for nausea or vomiting. 05/19/22  Yes Rakes, Connye Burkitt, FNP  pantoprazole (PROTONIX) 40 MG tablet Take 1 tablet (40 mg total) by mouth 2 (two) times daily. 06/04/22 08/03/22 Yes Shah, Pratik D, DO  polyethylene glycol  (MIRALAX / GLYCOLAX) 17 g packet Take 17 g by mouth daily as needed for moderate constipation or mild constipation. 03/10/22  Yes Pokhrel, Laxman, MD  QUEtiapine (SEROQUEL) 200 MG tablet Take 1 tablet (200 mg total) by mouth at bedtime. For sleep and for anxiety 03/17/22  Yes [name], [name], MD  rOPINIRole (REQUIP) 1 MG tablet Take 1 tablet (1 mg total) by mouth at bedtime. For leg cramps 09/15/21  Yes [name], [name], MD  rosuvastatin (CRESTOR) 5 MG tablet Take 1 tablet (5 mg total) by mouth daily. For cholesterol Patient taking differently: Take 5 mg by mouth at bedtime. For cholesterol 12/12/21  Yes [name], [name], MD  Semaglutide,0.25 or 0.5MG /DOS, (OZEMPIC, 0.25 OR 0.5 MG/DOSE,) 2 MG/1.5ML SOPN Inject 0.5 mg into the skin every Friday.   Yes [provider]  ticagrelor (BRILINTA) 90 MG TABS tablet Take 1 tablet (90 mg total) by mouth 2 (two) times daily. 06/04/22 07/04/22 Yes Shah, Pratik D, DO  Continuous Blood Gluc Receiver (FREESTYLE LIBRE 2 READER) DEVI USE TO TEST BLOOD SUGAR CONTINUOUSLY 11/15/21   [name], MD  Continuous Blood Gluc Sensor (FREESTYLE LIBRE 2 SENSOR) MISC Use multiple times daily to track glucose to prevent highs and lows as a complication of dialysis Dx:Z99.2, E11.59 09/15/21   [name], MD  dextromethorphan-guaiFENesin (MUCINEX DM) 30-600 MG 12hr tablet Take 1 tablet by mouth 2 (two) times daily. Patient not taking: Reported on 05/30/2022 04/28/22   Barton Dubois, MD  glucose blood (ONETOUCH ULTRA) test strip Use to test blood sugar 3 times daily as directed. DX: E11.9 09/13/20   [name], MD    Inpatient Medications: Scheduled Meds:  Chlorhexidine Gluconate Cloth  6 each Topical Q0600   pantoprazole (PROTONIX) IV  40 mg Intravenous BID   sodium chloride flush  3 mL Intravenous Q12H   Continuous Infusions:  PRN Meds: acetaminophen **OR** acetaminophen, albuterol, lidocaine (PF), lidocaine-prilocaine, pentafluoroprop-tetrafluoroeth  Allergies:     Allergies  Allergen Reactions   Azithromycin     Prolonged QT   Ace Inhibitors Cough   Clopidogrel Bisulfate Other (See Comments)    Sick, Headache, "Felt terrible"   Codeine Other (See Comments)    hallucinations   Lisinopril Cough   Penicillins Other (See Comments)    Unknown childhood reaction Did it involve swelling of the face/tongue/throat, SOB, or low BP? Unknown Did it involve sudden or severe rash/hives, skin peeling, or  any reaction on the inside of your mouth or nose? Unknown Did you need to seek medical attention at a hospital or doctor's office? Unknown When did it last happen?      childhood allergy If all above answers are "NO", may proceed with cephalosporin use.      Social History:   Social History   Socioeconomic History   Marital status: Widowed    Spouse name: Not on file   Number of children: 2   Years of education: GED   Highest education level: GED or equivalent  Occupational History   Occupation: Disabled  Tobacco Use   Smoking status: Former    Packs/day: 1.00    Years: 30.00    Total pack years: 30.00    Types: Cigarettes    Quit date: 12/04/2001    Years since quitting: 20.5    Passive exposure: Never   Smokeless tobacco: Never  Vaping Use   Vaping Use: Never used  Substance and Sexual Activity   Alcohol use: No    Alcohol/week: 6.0 standard drinks of alcohol    Types: 6 Cans of beer per week   Drug use: No   Sexual activity: Not Currently    Birth control/protection: Surgical  Other Topics Concern   Not on file  Social History Narrative   Lives in [place] with mother   Daughters live nearby   Social Determinants of Health   Financial Resource Strain: [place]  (06/09/2022)   Overall Financial Resource Strain (CARDIA)    Difficulty of Paying Living Expenses: Not hard at all  Food Insecurity: No Food Insecurity (12/16/2021)   Hunger Vital Sign    Worried About Running Out of Food in the Last Year: Never true    [place] in the Last Year: Never true  Transportation Needs: No Transportation Needs (06/09/2022)   PRAPARE - Hydrologist (Medical): No    Lack of Transportation (Non-Medical): No  Physical Activity: Inactive (12/16/2021)   Exercise Vital Sign    Days of Exercise per Week: 0 days    Minutes of Exercise per Session: 0 min  Stress: No Stress Concern Present (12/16/2021)   [place]    Feeling of Stress : Not at all  Social Connections: Moderately Isolated (12/16/2021)   Social Connection and Isolation Panel [NHANES]    Frequency of Communication with Friends and Family: More than three times a week    Frequency of Social Gatherings with Friends and Family: More than three times a week    Attends Religious Services: More than 4 times per year    Active Member of [employer] or Organizations: No    Attends Archivist Meetings: Never    Marital Status: Widowed  Intimate Partner Violence: Not At Risk (12/16/2021)   Humiliation, Afraid, Rape, and Kick questionnaire    Fear of Current or Ex-Partner: No    Emotionally Abused: No    Physically Abused: No    Sexually Abused: No    Family History:    Family History  Problem Relation Age of Onset   Heart disease Other    Hypertension Father    Cancer Father    Hypertension Mother    Hypertension Daughter    Pancreatitis Brother    Hypertension Daughter    Diabetes Daughter      ROS:  Please see the history of present illness.   All other ROS  reviewed and negative.     Physical Exam/Data:   Vitals:   06/10/22 0430 06/10/22 0431 06/10/22 0724 06/10/22 1051  BP:  (!) 135/56 (!) 141/61 (!) 130/58  Pulse:   81 97  Resp:   15 16  Temp:  98.2 F (36.8 C) 98.2 F (36.8 C) 98.2 F (36.8 C)  TempSrc:  Oral Oral Oral  SpO2:  96% 100% 96%  Weight: 77.8 kg     Height: 5\' 3"  (1.6 m)      No intake or output data in the 24 hours ending 06/10/22  1403    06/10/2022    4:30 AM 06/09/2022    6:11 PM 06/03/2022    9:18 AM  Last 3 Weights  Weight (lbs) 171 lb 9.6 oz 178 lb 177 lb 14.6 oz  Weight (kg) 77.837 kg 80.74 kg 80.7 kg     Body mass index is 30.4 kg/m.  General:  Well nourished, well developed, in no acute distress HEENT: normal Neck: no JVD Vascular: No carotid bruits; Distal pulses 2+ bilaterally Cardiac:  normal S1, S2; irregular rhythm, no murmurs rubs or gallops Lungs:  clear to auscultation bilaterally, no wheezing, rhonchi or rales  Abd: soft, nontender, no hepatomegaly  Ext: no edema Musculoskeletal:  No deformities, BUE and BLE strength normal and equal Skin: warm and dry  Neuro:  CNs 2-12 intact, no focal abnormalities noted Psych:  Normal affect   EKG:  The EKG was personally reviewed and demonstrates: Atrial fibrillation heart rate 93, old anteroseptal infarct Telemetry:  Telemetry was personally reviewed and demonstrates: A-fib 90-100 bpm  Relevant CV Studies: LHC 03/08/2022 1.  Successful atherectomy, PTCA, and stenting of severe stenosis in the mid LAD, reducing 90% calcific stenosis to 0%, TIMI-3 flow pre and post, with a 3.0 x 18 mm Onyx frontier DES 2.  Successful atherectomy, PTCA, and stenting of severe stenosis in the mid RCA, reducing 95% calcific stenosis to 0%, TIMI-3 flow pre and post, with a 3.0 x 34 mm Onyx frontier DES   TTE 03/05/2022  1. Left ventricular ejection fraction, by estimation, is 55%%. The left  ventricle has low normal function. The left ventricle has no regional wall  motion abnormalities. There is mild left ventricular hypertrophy. Left  ventricular diastolic parameters  are indeterminate.   2. Compared to prior study, RV dysfunction is new      . Right ventricular systolic function moderate to severely decreased.  The right ventricular size is mildly enlarged. There is severely elevated  pulmonary artery systolic pressure.   3. Left atrial size was mildly dilated.   4. Right  atrial size was mildly dilated.   5. A small pericardial effusion is present.   6. Mild mitral valve regurgitation.   7. The aortic valve is tricuspid. Aortic valve regurgitation is not  visualized. Aortic valve sclerosis is present, with no evidence of aortic  valve stenosis.   8. The inferior vena cava is dilated in size with <50% respiratory  variability, suggesting right atrial pressure of 15 mmHg.   Laboratory Data:  High Sensitivity Troponin:   Recent Labs  Lab 05/30/22 1803 05/30/22 1927 06/08/22 2245 06/09/22 2219 06/10/22 0454  TROPONINIHS 89* 92* 53* 33* 46*     Chemistry Recent Labs  Lab 06/04/22 0735 06/08/22 2245 06/09/22 2054 06/10/22 1007  NA  --  135 135 137  K  --  3.4* 3.2* 3.4*  CL  --  93* 93* 94*  CO2  --  28  28 24  GLUCOSE  --  163* 224* 127*  BUN  --  22 38* 41*  CREATININE  --  4.81* 6.15* 7.58*  CALCIUM  --  9.1 9.5 8.9  MG 1.8  --   --  2.0  GFRNONAA  --  9* 7* 5*  ANIONGAP  --  14 14 19*    Recent Labs  Lab 06/08/22 2245 06/09/22 2054 06/10/22 1007  PROT 6.0* 5.9*  --   ALBUMIN 2.4* 3.2* 2.3*  AST 27 16  --   ALT 49* 36  --   ALKPHOS 80 75  --   BILITOT 0.7 0.4  --    Lipids No results for input(s): "CHOL", "TRIG", "HDL", "LABVLDL", "LDLCALC", "CHOLHDL" in the last 168 hours.  Hematology Recent Labs  Lab 06/08/22 2245 06/09/22 2054 06/10/22 1007  WBC 11.0* 9.7 10.9*  RBC 3.49* 3.04* 2.82*  HGB 9.5* 8.3* 7.9*  HCT 30.0* 25.9* 23.9*  MCV 86.0 85.2 84.8  MCH 27.2 27.3 28.0  MCHC 31.7 32.0 33.1  RDW 16.8* 17.2* 17.2*  PLT 479* 496* 443*   Thyroid No results for input(s): "TSH", "FREET4" in the last 168 hours.  BNPNo results for input(s): "BNP", "PROBNP" in the last 168 hours.  DDimer No results for input(s): "DDIMER" in the last 168 hours.   Radiology/Studies:  DG Chest Portable 1 View  Result Date: 06/09/2022 CLINICAL DATA:  Worsening shortness of breath. EXAM: PORTABLE CHEST 1 VIEW COMPARISON:  Radiograph yesterday.   CT 05/15/2022 FINDINGS: Cardiomegaly. Similar central vascular congestion allowing for differences in technique. No convincing pulmonary edema. Increasing atelectasis at the left lung base. No large pleural effusion or pneumothorax. Stable osseous structures. IMPRESSION: 1. Increasing atelectasis at the left lung base. 2. Cardiomegaly and central vascular congestion, unchanged. Electronically Signed   By: Keith Rake M.D.   On: 06/09/2022 21:02   DG Chest 2 View  Result Date: 06/08/2022 CLINICAL DATA:  Shortness of breath EXAM: CHEST - 2 VIEW COMPARISON:  05/30/2022 FINDINGS: Unchanged mild cardiomegaly with pulmonary vascular congestion. No overt pulmonary edema or focal airspace consolidation. No pleural effusion. IMPRESSION: Cardiomegaly and pulmonary vascular congestion without overt pulmonary edema. Electronically Signed   By: Ulyses Jarred M.D.   On: 06/08/2022 19:32     Assessment and Plan:   #Chest Pain #Gastritis  #GI Bleed  -Recently admitted to the hospital just last week for chest pain secondary to gastritis and bleeding duodenal AVM.  She was found to be anemic.  She underwent APC to the duodenal AVMs.  Her Eliquis and Brilinta were held but have been restarted.  She now presents again with chest discomfort and feeling weak and fatigued.  She is with fecal occult positive stools.  Hemoglobin is trending down.  She reports she has pain in the center of her chest as well as epigastric region. -Underwent PCI to the mid LAD and mid RCA on 03/08/2022.  She is roughly 3 months since that procedure.  Okay to hold Brilinta and Eliquis for procedure. -To me symptoms could be related to gastritis.  Given her ongoing anemia would recommend to evaluate her from a GI standpoint first.  I do not believe she merits left heart catheterization.  Troponins are minimally elevated and flat.  This is likely due to small vessel disease in the setting of anemia.  #CAD status post PCI -Recent intervention on  03/08/2022 to the mid LAD and mid RCA.  Okay to hold Brilinta for now.  On Eliquis  for A-fib.  Okay to hold this as well. -Restart home Crestor as you are able.  #Permanent atrial fibrillation -Currently in rate controlled A-fib.  Can be exacerbated by worsening anemia.  Given recurrent GI bleed would recommend to hold Eliquis for any procedures.  Would not recommend bridging with heparin.  I see no need for this.  Her hemoglobin continues to decline.  I believe holding this is the right thing to do. -Restart Eliquis at discretion of gastroenterology.  We will await their guidance regarding safety of anticoagulation.  #ESRD on HD -per nephrology  #HFpEF #Cor pulmonale -volume control per nephrology   For questions or updates, please contact [place] Please consult [email or web address] for contact info under   [employer]. Audie Box, MD, [place]  [address] [place], [place] [address] [phone]  2:50 PM

## 2022-06-10 NOTE — Consult Note (Signed)
[facility] Renal Consultation Note    Indication for Consultation:  Management of ESRD/hemodialysis; anemia, hypertension/volume and secondary hyperparathyroidism  HPI: [name] is a 71 y.o. female with ESRD on HD TTS at [facility]. PMH also significant for CAD s/p stenting, CHF, Afib on AC, DM, COPD, hx GIB.  She was recently admitted to [facility] 6/27-06/04/22 for GIB. She was seen by GI and s/p EGD with bleeding AVMs treated with APC. She continued to feel poorly after discharge. Presented to the ED on 7/6 with palpitations but left before being seen. Returned to the ED yesterday with ongoing fatigue and melena. Afib on EKG. Vascular congestion on CXR. Labs this am Na 137, K 3.4, CO2 24, BUN 41, Hgb 7.9. Now admitted under observation status.   Seen and examined at bedside. She mostly endorses low energy, just hasn't felt good since she was hospitalized. She also endorses dark stools, and racing heart rate. She has been compliant with dialysis. Her last treatment was Thursday. Nephrology asked to see for routine dialysis today.   Past Medical History:  Diagnosis Date   Acute on chronic diastolic CHF (congestive heart failure) ([place]) 03/05/2015   Grade 2. EF 60-65%   Anemia    hx of   Anxiety    Arthritis    CAD (coronary artery disease)    s/p Stenting in 2005;  [place] 7/09: Vigorous LV function, 2-3+ MR, RI 40%, RI stent patent, proximal-mid RCA 40-50%;  Echo 7/09:  EF 55-65%, trivial MR;  [place] 11/18/12: EF 66%, normal LV wall motion, mild to moderate anterior ischemia - reviewed by [facility] and felt to rep breast atten (low risk);  Echo 6/14: mild LVH, EF 55-65%, Gr 2 DD, PASP 44, trivial eff    Chronic kidney disease    CREATININE IS UP--GOING TO KIDNEY MD    Chronic neck pain    COPD (chronic obstructive pulmonary disease) (HCC)    Degenerative joint disease    left shoulder   Diabetic neuropathy (HCC)    Diverticulosis    Esophageal stricture    GERD (gastroesophageal  reflux disease)    Hyperlipidemia    Hypertension    IDDM (insulin dependent diabetes mellitus)    Pericardial effusion 03/06/2015   Very small per echo ; pleural nodules seen on CT chest.   Peripheral vascular disease ([place])    Stroke ([place])    "mini- years ago"   Vitamin D deficiency    Past Surgical History:  Procedure Laterality Date   A/V FISTULAGRAM N/A 08/10/2017   Procedure: A/V Fistulagram - Right Arm;  Surgeon: Elam Dutch, MD;  Location: [place] CV LAB;  Service: Cardiovascular;  Laterality: N/A;   A/V FISTULAGRAM N/A 03/03/2020   Procedure: A/V FISTULAGRAM - Right Arm;  Surgeon: Marty Heck, MD;  Location: [place] CV LAB;  Service: Cardiovascular;  Laterality: N/A;   ABDOMINAL HYSTERECTOMY     AV FISTULA PLACEMENT Right 07/20/2015   Procedure: RADIAL CEPHALIC ARTERIOVENOUS FISTULA CREATION RIGHT ARM;  Surgeon: Angelia Mould, MD;  Location: [place];  Service: Vascular;  Laterality: Right;   AV FISTULA PLACEMENT Right 11/26/2015   Procedure:  Creation of RIGHT BRACHIO-CEPHALIC arteriovenous fistula;  Surgeon: Angelia Mould, MD;  Location: [facility] OR;  Service: Vascular;  Laterality: Right;   BACK SURGERY     2007 & 2013   [place]  2003, 2005, 2009   1 stent  cardiac stents     CARDIOVERSION N/A 04/24/2022   Procedure: CARDIOVERSION;  Surgeon: Fay Records, MD;  Location: [facility] ENDOSCOPY;  Service: Cardiovascular;  Laterality: N/A;   CATARACT EXTRACTION W/PHACO Right 05/04/2014   Procedure: CATARACT EXTRACTION PHACO AND INTRAOCULAR LENS PLACEMENT ([place]);  Surgeon: Williams Che, MD;  Location: [facility];  Service: Ophthalmology;  Laterality: Right;  CDE 1.47   CATARACT EXTRACTION W/PHACO Left 07/20/2014   Procedure: CATARACT EXTRACTION WITH PHACO AND INTRAOCULAR LENS PLACEMENT LEFT EYE CDE=1.79;  Surgeon: Williams Che, MD;  Location: [facility];  Service: Ophthalmology;  Laterality: Left;   CERVICAL SPINE SURGERY   2012   8 screws   CORONARY ATHERECTOMY N/A 03/08/2022   Procedure: CORONARY ATHERECTOMY;  Surgeon: Sherren Mocha, MD;  Location: [place] CV LAB;  Service: Cardiovascular;  Laterality: N/A;   CYSTOSCOPY WITH INJECTION N/A 06/03/2013   Procedure: MACROPLASTIQUE  INJECTION ;  Surgeon: Malka So, MD;  Location: [facility];  Service: Urology;  Laterality: N/A;   ESOPHAGEAL DILATION Right 06/02/2022   Procedure: ESOPHAGEAL DILATION;  Surgeon: Harvel Quale, MD;  [facility];  Service: Gastroenterology;  Laterality: Right;   ESOPHAGOGASTRODUODENOSCOPY (EGD) WITH PROPOFOL N/A 06/02/2022   Procedure: ESOPHAGOGASTRODUODENOSCOPY (EGD) WITH PROPOFOL;  Surgeon: Harvel Quale, MD;  [facility];  Service: Gastroenterology;  Laterality: N/A;  TO BE DONE IN OR 2   FISTULOGRAM Right 10/20/2016   Procedure: FISTULOGRAM WITH POSSIBLE INTERVENTION;  Surgeon: [name], MD;  Location: [facility];  Service: Vascular;  Laterality: Right;   HEMOSTASIS CLIP PLACEMENT  06/02/2022   Procedure: HEMOSTASIS CLIP PLACEMENT;  Surgeon: Harvel Quale, MD;  [facility];  Service: Gastroenterology;;   HOT HEMOSTASIS  06/02/2022   Procedure: HOT HEMOSTASIS (ARGON PLASMA COAGULATION/BICAP);  Surgeon: Montez Morita, Quillian Quince, MD;  [facility];  Service: Gastroenterology;;   INSERTION OF DIALYSIS CATHETER N/A 11/26/2015   Procedure: INSERTION OF DIALYSIS CATHETER;  Surgeon: Angelia Mould, MD;  Location: [place];  Service: Vascular;  Laterality: N/A;   INTRAVASCULAR IMAGING/OCT N/A 03/08/2022   Procedure: INTRAVASCULAR IMAGING/OCT;  Surgeon: Sherren Mocha, MD;  Location: [place] CV LAB;  Service: Cardiovascular;  Laterality: N/A;   LAMINECTOMY  12/29/2011   LEFT HEART CATH AND CORONARY ANGIOGRAPHY N/A 03/06/2022   Procedure: LEFT HEART CATH AND CORONARY ANGIOGRAPHY;  Surgeon: Nelva Bush, MD;  Location: [place] CV LAB;  Service:  Cardiovascular;  Laterality: N/A;   LUMBAR LAMINECTOMY/DECOMPRESSION MICRODISCECTOMY  12/29/2011   Procedure: LUMBAR LAMINECTOMY/DECOMPRESSION MICRODISCECTOMY;  Surgeon: Floyce Stakes, MD;  Location: [place] NEURO ORS;  Service: Neurosurgery;  Laterality: N/A;  Lumbar Two through Lumbar Five Laminectomies/Cellsaver   PARTIAL HYSTERECTOMY     PERIPHERAL VASCULAR BALLOON ANGIOPLASTY  03/03/2020   Procedure: PERIPHERAL VASCULAR BALLOON ANGIOPLASTY;  Surgeon: Marty Heck, MD;  Location: [place] CV LAB;  Service: Cardiovascular;;  Rt arm fistula   PERIPHERAL VASCULAR BALLOON ANGIOPLASTY Right 04/15/2021   Procedure: PERIPHERAL VASCULAR BALLOON ANGIOPLASTY;  Surgeon: Angelia Mould, MD;  Location: [place] CV LAB;  Service: Cardiovascular;  Laterality: Right;  arm fistula   PERIPHERAL VASCULAR BALLOON ANGIOPLASTY  03/14/2022   Procedure: PERIPHERAL VASCULAR BALLOON ANGIOPLASTY;  Surgeon: Broadus John, MD;  Location: [place] CV LAB;  Service: Cardiovascular;;  right arm AVF   PERIPHERAL VASCULAR CATHETERIZATION N/A 11/22/2015   Procedure: Fistulagram;  Surgeon: Angelia Mould, MD;  Location: [place] CV LAB;  Service: Cardiovascular;  Laterality: N/A;  PERIPHERAL VASCULAR INTERVENTION  08/10/2017   Procedure: PERIPHERAL VASCULAR INTERVENTION;  Surgeon: Elam Dutch, MD;  Location: [place] CV LAB;  Service: Cardiovascular;;   SHOULDER SURGERY Right    Rotator cuff   Family History  Problem Relation Age of Onset   Heart disease Other    Hypertension Father    Cancer Father    Hypertension Mother    Hypertension Daughter    Pancreatitis Brother    Hypertension Daughter    Diabetes Daughter    Social History:  reports that she quit smoking about 20 years ago. Her smoking use included cigarettes. She has a 30.00 pack-year smoking history. She has never been exposed to tobacco smoke. She has never used smokeless tobacco. She reports that she does not drink  alcohol and does not use drugs. Allergies  Allergen Reactions   Azithromycin     Prolonged QT   Ace Inhibitors Cough   Clopidogrel Bisulfate Other (See Comments)    Sick, Headache, "Felt terrible"   Codeine Other (See Comments)    hallucinations   Lisinopril Cough   Penicillins Other (See Comments)    Unknown childhood reaction Did it involve swelling of the face/tongue/throat, SOB, or low BP? Unknown Did it involve sudden or severe rash/hives, skin peeling, or any reaction on the inside of your mouth or nose? Unknown Did you need to seek medical attention at a hospital or doctor's office? Unknown When did it last happen?      childhood allergy If all above answers are "NO", may proceed with cephalosporin use.     Prior to Admission medications   Medication Sig Start Date End Date Taking? Authorizing Provider  acetaminophen (TYLENOL) 500 MG tablet Take 1,000 mg by mouth every 6 (six) hours as needed for moderate pain or headache.   Yes [provider]  albuterol (PROVENTIL) (2.5 MG/3ML) 0.083% nebulizer solution Take 3 mLs (2.5 mg total) by nebulization every 6 (six) hours as needed for wheezing or shortness of breath. 12/02/21  Yes [name], MD  albuterol (VENTOLIN HFA) 108 (90 Base) MCG/ACT inhaler Inhale 2 puffs into the lungs every 6 (six) hours as needed for wheezing or shortness of breath. 12/02/21  Yes [name], MD  apixaban (ELIQUIS) 5 MG TABS tablet Take 1 tablet (5 mg total) by mouth 2 (two) times daily. 04/05/22  Yes Minus Breeding, MD  atenolol (TENORMIN) 100 MG tablet Take 1 tablet (100 mg total) by mouth at bedtime. 05/10/22  Yes [name], [name], MD  budesonide-formoterol (SYMBICORT) 160-4.5 MCG/ACT inhaler Inhale 2 puffs into the lungs 2 (two) times daily. 05/03/22  Yes [name], MD  calcium acetate (PHOSLO) 667 MG capsule Take 667-1,334 mg by mouth See admin instructions. Take 1334 mg with meals 3 times daily and 667 mg with snacks   Yes [provider]  Carboxymethylcellul-Glycerin (LUBRICATING EYE DROPS OP) Place 1 drop into both eyes daily as needed (dry eyes).   Yes [provider]  cetirizine (ZYRTEC) 10 MG tablet Take 1 tablet (10 mg total) by mouth daily as needed for allergies. Patient taking differently: Take 10 mg by mouth daily. 02/08/22  Yes [name], MD  diltiazem (CARDIZEM CD) 240 MG 24 hr capsule Take 1 capsule (240 mg total) by mouth daily. 05/17/22  Yes Minus Breeding, MD  gabapentin (NEURONTIN) 400 MG capsule Take 2 capsules (800 mg total) by mouth See admin instructions. Take it after HD Patient taking differently: Take 800 mg by mouth See admin instructions.  Take after dialysis 03/22/22  Yes [name], MD  hydrOXYzine (ATARAX) 50 MG tablet Take 1 tablet (50 mg total) by mouth 3 (three) times daily as needed for anxiety. 03/22/22  Yes [name], [name], MD  insulin aspart (NOVOLOG FLEXPEN) 100 UNIT/ML FlexPen Inject 1 Units into the skin 3 (three) times daily with meals. < 150: 0 units. 150-199:1 unit. 200-249: 2 units. 250-299: 3 units. 300-349: 4 units. Over 350: 5 units 05/19/22  Yes Rakes, Connye Burkitt, FNP  insulin degludec (TRESIBA FLEXTOUCH) 100 UNIT/ML FlexTouch Pen Inject 20 Units into the skin at bedtime. Via novo nordisk patient assistance program Patient taking differently: Inject 30 Units into the skin at bedtime. Via novo nordisk patient assistance program 05/03/22  Yes [name], MD  lidocaine-prilocaine (EMLA) cream Apply 1 application. topically as needed (dialysis).   Yes [provider]  multivitamin (RENA-VIT) TABS tablet Take 1 tablet by mouth daily. 05/13/22  Yes [provider]  nitroGLYCERIN (NITROSTAT) 0.4 MG SL tablet Place 1 tablet (0.4 mg total) under the tongue every 5 (five) minutes as needed for chest pain. 11/09/21  Yes Minus Breeding, MD  omeprazole (PRILOSEC) 20 MG capsule Take 1 capsule (20 mg total) by mouth daily. On an empty stomach 05/10/22  Yes [name],  [name], MD  ondansetron (ZOFRAN) 4 MG tablet TAKE 1 TABLET BY MOUTH EVERY 8 HOURS AS NEEDED FOR NAUSEA AND VOMITING Patient taking differently: Take 4 mg by mouth every 8 (eight) hours as needed for nausea or vomiting. 05/19/22  Yes Rakes, Connye Burkitt, FNP  pantoprazole (PROTONIX) 40 MG tablet Take 1 tablet (40 mg total) by mouth 2 (two) times daily. 06/04/22 08/03/22 Yes Shah, Pratik D, DO  polyethylene glycol (MIRALAX / GLYCOLAX) 17 g packet Take 17 g by mouth daily as needed for moderate constipation or mild constipation. 03/10/22  Yes Pokhrel, Laxman, MD  QUEtiapine (SEROQUEL) 200 MG tablet Take 1 tablet (200 mg total) by mouth at bedtime. For sleep and for anxiety 03/17/22  Yes [name], [name], MD  rOPINIRole (REQUIP) 1 MG tablet Take 1 tablet (1 mg total) by mouth at bedtime. For leg cramps 09/15/21  Yes [name], [name], MD  rosuvastatin (CRESTOR) 5 MG tablet Take 1 tablet (5 mg total) by mouth daily. For cholesterol Patient taking differently: Take 5 mg by mouth at bedtime. For cholesterol 12/12/21  Yes [name], [name], MD  Semaglutide,0.25 or 0.5MG /DOS, (OZEMPIC, 0.25 OR 0.5 MG/DOSE,) 2 MG/1.5ML SOPN Inject 0.5 mg into the skin every Friday.   Yes [provider]  ticagrelor (BRILINTA) 90 MG TABS tablet Take 1 tablet (90 mg total) by mouth 2 (two) times daily. 06/04/22 07/04/22 Yes Shah, Pratik D, DO  Continuous Blood Gluc Receiver (FREESTYLE LIBRE 2 READER) DEVI USE TO TEST BLOOD SUGAR CONTINUOUSLY 11/15/21   [name], MD  Continuous Blood Gluc Sensor (FREESTYLE LIBRE 2 SENSOR) MISC Use multiple times daily to track glucose to prevent highs and lows as a complication of dialysis Dx:Z99.2, E11.59 09/15/21   [name], MD  dextromethorphan-guaiFENesin (MUCINEX DM) 30-600 MG 12hr tablet Take 1 tablet by mouth 2 (two) times daily. Patient not taking: Reported on 05/30/2022 04/28/22   Barton Dubois, MD  glucose blood (ONETOUCH ULTRA) test strip Use to test blood sugar 3 times daily as directed. DX:  E11.9 09/13/20   [name], MD   Current Facility-Administered Medications  Medication Dose Route Frequency Provider Last Rate Last Admin   acetaminophen (TYLENOL) tablet 650 mg  650 mg Oral Q6H PRN Fuller Plan  A, MD       Or   acetaminophen (TYLENOL) suppository 650 mg  650 mg Rectal Q6H PRN Fuller Plan A, MD       albuterol (PROVENTIL) (2.5 MG/3ML) 0.083% nebulizer solution 2.5 mg  2.5 mg Nebulization Q6H PRN Fuller Plan A, MD       Chlorhexidine Gluconate Cloth 2 % PADS 6 each  6 each Topical Q0600 Lynnda Child, PA-C   6 each at 06/10/22 1147   pantoprazole (PROTONIX) injection 40 mg  40 mg Intravenous BID Lucrezia Starch, MD   40 mg at 06/10/22 0904   sodium chloride flush (NS) 0.9 % injection 3 mL  3 mL Intravenous Q12H Smith, Rondell A, MD   3 mL at 06/10/22 1148     ROS: As per HPI otherwise negative.  Physical Exam: Vitals:   06/10/22 0430 06/10/22 0431 06/10/22 0724 06/10/22 1051  BP:  (!) 135/56 (!) 141/61 (!) 130/58  Pulse:   81 97  Resp:   15 16  Temp:  98.2 F (36.8 C) 98.2 F (36.8 C) 98.2 F (36.8 C)  TempSrc:  Oral Oral Oral  SpO2:  96% 100% 96%  Weight: 77.8 kg     Height: 5\' 3"  (1.6 m)        General: Appears comfortable, in no distress  Head: NCAT sclera not icteric MMM Neck: Supple. No JVD appreciated  Lungs: Clear bilaterally without wheezes, rales, or rhonchi. Heart: Irregular,  no rub, or gallop  Abdomen: soft non-tender, bowel sounds normal, no masses  Lower extremities: trace edema,  no open wounds  Neuro: A & O X 3. Moves all extremities spontaneously. Psych:  Responds to questions appropriately with a normal affect. Dialysis Access: RUE AVF +bruit   Labs: Basic Metabolic Panel: Recent Labs  Lab 06/08/22 2245 06/09/22 2054 06/10/22 1007  NA 135 135 137  K 3.4* 3.2* 3.4*  CL 93* 93* 94*  CO2 28 28 24   GLUCOSE 163* 224* 127*  BUN 22 38* 41*  CREATININE 4.81* 6.15* 7.58*  CALCIUM 9.1 9.5 8.9  PHOS  --   --  4.8*    Liver Function Tests: Recent Labs  Lab 06/08/22 2245 06/09/22 2054 06/10/22 1007  AST 27 16  --   ALT 49* 36  --   ALKPHOS 80 75  --   BILITOT 0.7 0.4  --   PROT 6.0* 5.9*  --   ALBUMIN 2.4* 3.2* 2.3*   No results for input(s): "LIPASE", "AMYLASE" in the last 168 hours. No results for input(s): "AMMONIA" in the last 168 hours. CBC: Recent Labs  Lab 06/08/22 2245 06/09/22 2054 06/10/22 1007  WBC 11.0* 9.7 10.9*  NEUTROABS 7.9* 7.1  --   HGB 9.5* 8.3* 7.9*  HCT 30.0* 25.9* 23.9*  MCV 86.0 85.2 84.8  PLT 479* 496* 443*   Cardiac Enzymes: No results for input(s): "CKTOTAL", "CKMB", "CKMBINDEX", "TROPONINI" in the last 168 hours. CBG: Recent Labs  Lab 06/04/22 0104 06/04/22 0733 06/04/22 0805 06/04/22 1136 06/09/22 2046  GLUCAP 114* 64* 101* 204* 231*   Iron Studies: No results for input(s): "IRON", "TIBC", "TRANSFERRIN", "FERRITIN" in the last 72 hours. Studies/Results: DG Chest Portable 1 View  Result Date: 06/09/2022 CLINICAL DATA:  Worsening shortness of breath. EXAM: PORTABLE CHEST 1 VIEW COMPARISON:  Radiograph yesterday.  CT 05/15/2022 FINDINGS: Cardiomegaly. Similar central vascular congestion allowing for differences in technique. No convincing pulmonary edema. Increasing atelectasis at the left lung base. No large pleural effusion or  pneumothorax. Stable osseous structures. IMPRESSION: 1. Increasing atelectasis at the left lung base. 2. Cardiomegaly and central vascular congestion, unchanged. Electronically Signed   By: Keith Rake M.D.   On: 06/09/2022 21:02   DG Chest 2 View  Result Date: 06/08/2022 CLINICAL DATA:  Shortness of breath EXAM: CHEST - 2 VIEW COMPARISON:  05/30/2022 FINDINGS: Unchanged mild cardiomegaly with pulmonary vascular congestion. No overt pulmonary edema or focal airspace consolidation. No pleural effusion. IMPRESSION: Cardiomegaly and pulmonary vascular congestion without overt pulmonary edema. Electronically Signed   By: Ulyses Jarred M.D.   On: 06/08/2022 19:32    Dialysis Orders:  Unit: [facility] TTS Schedule/Time: 4:00  EDW: 78.5 kg Flows: 350/500 Bath: 2K/2.5Ca Access: AVF Heparin: 1000 bolus then 1000/hr ESA: Mircera 200 q 2 weeks (last dosed on 7/4) VDRA: None  Assessment/Plan: Melena/Recurrent GIB. S/p EGD with AVMs on last admission. GI consulted.  ESRD -   HD TTS.  Continue on schedule --HD today.  Hold heparin  Hypokalemia - Used added K bath today  Hypertension/volume  - BP ok. Some volume on exam. Challenge EDW today as tolerated.  Anemia  -Hgb 7.9 s/p transfusion last admission. Follow trends. Received ESA at OP unit on 7/4.  Metabolic bone disease -  Ca/Phos ok. Continue home meds AFib - Rate controlled. Eliquis held. States OP cardiology was planning ablation.  Nutrition - Renal diet with fluid restriction   Lynnda Child PA-C [facility] 06/10/2022, 12:10 PM

## 2022-06-10 NOTE — Consult Note (Signed)
[facility] Consultation Note  Referring Provider: [facility] Primary Care Physician:  [name], MD Primary Gastroenterologist:  Dr. Michail Sermon  Reason for Consultation:  anemia, melena  HPI: [name] is a 71 y.o. female on eliquis for atrial fibrillation jointly with brilinta for CAD with stent in April 2023.  Patient has had progressive weakness and dark stools.  Found to be anemic with blood in stool; underwent endoscopy with Dr. Jenetta Downer last week which showed a couple proximal duodenal AVMs which were cauterized and clipped.  Patient reports she never felt well, even at time of hospital discharge, with ongoing dark stools and weakness.  She presented back to [facility] ED couple days ago and again [facility] yesterday.  No hematemesis or hematochezia.  Was apparently started back on anticoagulation pretty quickly after her recent endoscopy.   Past Medical History:  Diagnosis Date   Acute on chronic diastolic CHF (congestive heart failure) ([place]) 03/05/2015   Grade 2. EF 60-65%   Anemia    hx of   Anxiety    Arthritis    CAD (coronary artery disease)    s/p Stenting in 2005;  [place] 7/09: Vigorous LV function, 2-3+ MR, RI 40%, RI stent patent, proximal-mid RCA 40-50%;  Echo 7/09:  EF 55-65%, trivial MR;  Myov[place] 11/18/12: EF 66%, normal LV wall motion, mild to moderate anterior ischemia - [facility] and felt to rep breast atten (low risk);  Echo 6/14: mild LVH, EF 55-65%, Gr 2 DD, PASP 44, trivial eff    Chronic kidney disease    CREATININE IS UP--GOING TO KIDNEY MD    Chronic neck pain    COPD (chronic obstructive pulmonary disease) (HCC)    Degenerative joint disease    left shoulder   Diabetic neuropathy (HCC)    Diverticulosis    Esophageal stricture    GERD (gastroesophageal reflux disease)    Hyperlipidemia    Hypertension    IDDM (insulin dependent diabetes mellitus)    Pericardial effusion 03/06/2015   Very small per echo ; pleural nodules seen on  CT chest.   Peripheral vascular disease ([place])    Stroke ([place])    "mini- years ago"   Vitamin D deficiency     Past Surgical History:  Procedure Laterality Date   A/V FISTULAGRAM N/A 08/10/2017   Procedure: A/V Fistulagram - Right Arm;  Surgeon: Elam Dutch, MD;  Location: [place] [facility];  Service: Cardiovascular;  Laterality: N/A;   A/V FISTULAGRAM N/A 03/03/2020   Procedure: A/V FISTULAGRAM - Right Arm;  Surgeon: Marty Heck, MD;  Location: [place] [facility];  Service: Cardiovascular;  Laterality: N/A;   ABDOMINAL HYSTERECTOMY     AV FISTULA PLACEMENT Right 07/20/2015   Procedure: RADIAL CEPHALIC ARTERIOVENOUS FISTULA CREATION RIGHT ARM;  Surgeon: Angelia Mould, MD;  Location: [place];  Service: Vascular;  Laterality: Right;   AV FISTULA PLACEMENT Right 11/26/2015   Procedure:  Creation of RIGHT BRACHIO-CEPHALIC arteriovenous fistula;  Surgeon: Angelia Mould, MD;  Location: [facility];  Service: Vascular;  Laterality: Right;   BACK SURGERY     2007 & 2013   [place]  2003, 2005, 2009   1 stent   cardiac stents     CARDIOVERSION N/A 04/24/2022   Procedure: CARDIOVERSION;  Surgeon: Fay Records, MD;  Location: [place];  Service: Cardiovascular;  Laterality: N/A;   CATARACT EXTRACTION W/PHACO Right 05/04/2014   Procedure:  CATARACT EXTRACTION PHACO AND INTRAOCULAR LENS PLACEMENT (IOC);  Surgeon: Williams Che, MD;  Location: [facility];  Service: Ophthalmology;  Laterality: Right;  CDE 1.47   CATARACT EXTRACTION W/PHACO Left 07/20/2014   Procedure: CATARACT EXTRACTION WITH PHACO AND INTRAOCULAR LENS PLACEMENT LEFT EYE CDE=1.79;  Surgeon: Williams Che, MD;  Location: [facility];  Service: Ophthalmology;  Laterality: Left;   CERVICAL SPINE SURGERY  2012   8 screws   CORONARY ATHERECTOMY N/A 03/08/2022   Procedure: CORONARY ATHERECTOMY;  Surgeon: Sherren Mocha, MD;  Location: [place] [facility];  Service: Cardiovascular;   Laterality: N/A;   CYSTOSCOPY WITH INJECTION N/A 06/03/2013   Procedure: MACROPLASTIQUE  INJECTION ;  Surgeon: Malka So, MD;  Location: [facility];  Service: Urology;  Laterality: N/A;   ESOPHAGEAL DILATION Right 06/02/2022   Procedure: ESOPHAGEAL DILATION;  Surgeon: Harvel Quale, MD;  [facility];  Service: Gastroenterology;  Laterality: Right;   ESOPHAGOGASTRODUODENOSCOPY (EGD) WITH PROPOFOL N/A 06/02/2022   Procedure: ESOPHAGOGASTRODUODENOSCOPY (EGD) WITH PROPOFOL;  Surgeon: Harvel Quale, MD;  [facility];  Service: Gastroenterology;  Laterality: N/A;  TO BE DONE IN OR 2   FISTULOGRAM Right 10/20/2016   Procedure: FISTULOGRAM WITH POSSIBLE INTERVENTION;  Surgeon: [name], MD;  Location: [facility];  Service: Vascular;  Laterality: Right;   HEMOSTASIS CLIP PLACEMENT  06/02/2022   Procedure: HEMOSTASIS CLIP PLACEMENT;  Surgeon: Harvel Quale, MD;  [facility];  Service: Gastroenterology;;   HOT HEMOSTASIS  06/02/2022   Procedure: HOT HEMOSTASIS (ARGON PLASMA COAGULATION/BICAP);  Surgeon: Montez Morita, Quillian Quince, MD;  [facility];  Service: Gastroenterology;;   INSERTION OF DIALYSIS CATHETER N/A 11/26/2015   Procedure: INSERTION OF DIALYSIS CATHETER;  Surgeon: Angelia Mould, MD;  Location: [place];  Service: Vascular;  Laterality: N/A;   INTRAVASCULAR IMAGING/OCT N/A 03/08/2022   Procedure: INTRAVASCULAR IMAGING/OCT;  Surgeon: Sherren Mocha, MD;  Location: [place] [facility];  Service: Cardiovascular;  Laterality: N/A;   LAMINECTOMY  12/29/2011   LEFT HEART CATH AND CORONARY ANGIOGRAPHY N/A 03/06/2022   Procedure: LEFT HEART CATH AND CORONARY ANGIOGRAPHY;  Surgeon: Nelva Bush, MD;  Location: [place] [facility];  Service: Cardiovascular;  Laterality: N/A;   LUMBAR LAMINECTOMY/DECOMPRESSION MICRODISCECTOMY  12/29/2011   Procedure: LUMBAR LAMINECTOMY/DECOMPRESSION MICRODISCECTOMY;  Surgeon: Floyce Stakes, MD;  Location: [place] [facility];  Service: Neurosurgery;  Laterality: N/A;  Lumbar Two through Lumbar Five Laminectomies/Cellsaver   PARTIAL HYSTERECTOMY     PERIPHERAL VASCULAR BALLOON ANGIOPLASTY  03/03/2020   Procedure: PERIPHERAL VASCULAR BALLOON ANGIOPLASTY;  Surgeon: Marty Heck, MD;  Location: [place] [facility];  Service: Cardiovascular;;  Rt arm fistula   PERIPHERAL VASCULAR BALLOON ANGIOPLASTY Right 04/15/2021   Procedure: PERIPHERAL VASCULAR BALLOON ANGIOPLASTY;  Surgeon: Angelia Mould, MD;  Location: [place] [facility];  Service: Cardiovascular;  Laterality: Right;  arm fistula   PERIPHERAL VASCULAR BALLOON ANGIOPLASTY  03/14/2022   Procedure: PERIPHERAL VASCULAR BALLOON ANGIOPLASTY;  Surgeon: Broadus John, MD;  Location: [place] [facility];  Service: Cardiovascular;;  right arm AVF   PERIPHERAL VASCULAR CATHETERIZATION N/A 11/22/2015   Procedure: Fistulagram;  Surgeon: Angelia Mould, MD;  Location: [place] [facility];  Service: Cardiovascular;  Laterality: N/A;   PERIPHERAL VASCULAR INTERVENTION  08/10/2017   Procedure: PERIPHERAL VASCULAR INTERVENTION;  Surgeon: Elam Dutch, MD;  Location: [place] [facility];  Service: Cardiovascular;;   SHOULDER SURGERY Right    Rotator cuff  Prior to Admission medications   Medication Sig Start Date End Date Taking? Authorizing Provider  acetaminophen (TYLENOL) 500 MG tablet Take 1,000 mg by mouth every 6 (six) hours as needed for moderate pain or headache.   Yes [provider]  albuterol (PROVENTIL) (2.5 MG/3ML) 0.083% nebulizer solution Take 3 mLs (2.5 mg total) by nebulization every 6 (six) hours as needed for wheezing or shortness of breath. 12/02/21  Yes [name], MD  albuterol (VENTOLIN HFA) 108 (90 Base) MCG/ACT inhaler Inhale 2 puffs into the lungs every 6 (six) hours as needed for wheezing or shortness of breath. 12/02/21  Yes [name], MD  apixaban (ELIQUIS) 5 MG TABS tablet  Take 1 tablet (5 mg total) by mouth 2 (two) times daily. 04/05/22  Yes Minus Breeding, MD  atenolol (TENORMIN) 100 MG tablet Take 1 tablet (100 mg total) by mouth at bedtime. 05/10/22  Yes [name], [name], MD  budesonide-formoterol (SYMBICORT) 160-4.5 MCG/ACT inhaler Inhale 2 puffs into the lungs 2 (two) times daily. 05/03/22  Yes [name], MD  calcium acetate (PHOSLO) 667 MG capsule Take 667-1,334 mg by mouth See admin instructions. Take 1334 mg with meals 3 times daily and 667 mg with snacks   Yes [provider]  Carboxymethylcellul-Glycerin (LUBRICATING EYE DROPS OP) Place 1 drop into both eyes daily as needed (dry eyes).   Yes [provider]  cetirizine (ZYRTEC) 10 MG tablet Take 1 tablet (10 mg total) by mouth daily as needed for allergies. Patient taking differently: Take 10 mg by mouth daily. 02/08/22  Yes [name], MD  diltiazem (CARDIZEM CD) 240 MG 24 hr capsule Take 1 capsule (240 mg total) by mouth daily. 05/17/22  Yes Minus Breeding, MD  gabapentin (NEURONTIN) 400 MG capsule Take 2 capsules (800 mg total) by mouth See admin instructions. Take it after HD Patient taking differently: Take 800 mg by mouth See admin instructions. Take after dialysis 03/22/22  Yes [name], MD  hydrOXYzine (ATARAX) 50 MG tablet Take 1 tablet (50 mg total) by mouth 3 (three) times daily as needed for anxiety. 03/22/22  Yes [name], [name], MD  insulin aspart (NOVOLOG FLEXPEN) 100 UNIT/ML FlexPen Inject 1 Units into the skin 3 (three) times daily with meals. < 150: 0 units. 150-199:1 unit. 200-249: 2 units. 250-299: 3 units. 300-349: 4 units. Over 350: 5 units 05/19/22  Yes Rakes, Connye Burkitt, FNP  insulin degludec (TRESIBA FLEXTOUCH) 100 UNIT/ML FlexTouch Pen Inject 20 Units into the skin at bedtime. Via novo nordisk patient assistance program Patient taking differently: Inject 30 Units into the skin at bedtime. Via novo nordisk patient assistance program 05/03/22  Yes [name], MD   lidocaine-prilocaine (EMLA) cream Apply 1 application. topically as needed (dialysis).   Yes [provider]  multivitamin (RENA-VIT) TABS tablet Take 1 tablet by mouth daily. 05/13/22  Yes [provider]  nitroGLYCERIN (NITROSTAT) 0.4 MG SL tablet Place 1 tablet (0.4 mg total) under the tongue every 5 (five) minutes as needed for chest pain. 11/09/21  Yes Minus Breeding, MD  omeprazole (PRILOSEC) 20 MG capsule Take 1 capsule (20 mg total) by mouth daily. On an empty stomach 05/10/22  Yes [name], [name], MD  ondansetron (ZOFRAN) 4 MG tablet TAKE 1 TABLET BY MOUTH EVERY 8 HOURS AS NEEDED FOR NAUSEA AND VOMITING Patient taking differently: Take 4 mg by mouth every 8 (eight) hours as needed for nausea or vomiting. 05/19/22  Yes Rakes, Connye Burkitt, FNP  pantoprazole (PROTONIX) 40 MG tablet Take 1  tablet (40 mg total) by mouth 2 (two) times daily. 06/04/22 08/03/22 Yes Shah, Pratik D, DO  polyethylene glycol (MIRALAX / GLYCOLAX) 17 g packet Take 17 g by mouth daily as needed for moderate constipation or mild constipation. 03/10/22  Yes Pokhrel, Laxman, MD  QUEtiapine (SEROQUEL) 200 MG tablet Take 1 tablet (200 mg total) by mouth at bedtime. For sleep and for anxiety 03/17/22  Yes [name], [name], MD  rOPINIRole (REQUIP) 1 MG tablet Take 1 tablet (1 mg total) by mouth at bedtime. For leg cramps 09/15/21  Yes [name], [name], MD  rosuvastatin (CRESTOR) 5 MG tablet Take 1 tablet (5 mg total) by mouth daily. For cholesterol Patient taking differently: Take 5 mg by mouth at bedtime. For cholesterol 12/12/21  Yes [name], [name], MD  Semaglutide,0.25 or 0.5MG /DOS, (OZEMPIC, 0.25 OR 0.5 MG/DOSE,) 2 MG/1.5ML SOPN Inject 0.5 mg into the skin every Friday.   Yes [provider]  ticagrelor (BRILINTA) 90 MG TABS tablet Take 1 tablet (90 mg total) by mouth 2 (two) times daily. 06/04/22 07/04/22 Yes Shah, Pratik D, DO  Continuous Blood Gluc Receiver (FREESTYLE LIBRE 2 READER) DEVI USE TO TEST BLOOD SUGAR  CONTINUOUSLY 11/15/21   [name], MD  Continuous Blood Gluc Sensor (FREESTYLE LIBRE 2 SENSOR) MISC Use multiple times daily to track glucose to prevent highs and lows as a complication of dialysis Dx:Z99.2, E11.59 09/15/21   [name], MD  dextromethorphan-guaiFENesin (MUCINEX DM) 30-600 MG 12hr tablet Take 1 tablet by mouth 2 (two) times daily. Patient not taking: Reported on 05/30/2022 04/28/22   Barton Dubois, MD  glucose blood (ONETOUCH ULTRA) test strip Use to test blood sugar 3 times daily as directed. DX: E11.9 09/13/20   [name], MD    Current Facility-Administered Medications  Medication Dose Route Frequency Provider Last Rate Last Admin   acetaminophen (TYLENOL) tablet 650 mg  650 mg Oral Q6H PRN Norval Morton, MD       Or   acetaminophen (TYLENOL) suppository 650 mg  650 mg Rectal Q6H PRN Fuller Plan A, MD       albuterol (PROVENTIL) (2.5 MG/3ML) 0.083% nebulizer solution 2.5 mg  2.5 mg Nebulization Q6H PRN Fuller Plan A, MD       Chlorhexidine Gluconate Cloth 2 % PADS 6 each  6 each Topical Q0600 Lynnda Child, PA-C   6 each at 06/10/22 1147   lidocaine (PF) (XYLOCAINE) 1 % injection 5 mL  5 mL Intradermal PRN Lynnda Child, PA-C       lidocaine-prilocaine (EMLA) cream 1 Application  1 Application Topical PRN Lynnda Child, PA-C       pantoprazole (PROTONIX) injection 40 mg  40 mg Intravenous BID Lucrezia Starch, MD   40 mg at 06/10/22 5427   pentafluoroprop-tetrafluoroeth (GEBAUERS) aerosol 1 Application  1 Application Topical PRN Lynnda Child, PA-C       sodium chloride flush (NS) 0.9 % injection 3 mL  3 mL Intravenous Q12H Smith, Rondell A, MD   3 mL at 06/10/22 1148    Allergies as of 06/09/2022 - Review Complete 06/09/2022  Allergen Reaction Noted   Azithromycin  09/13/2017   Ace inhibitors Cough 03/29/2011   Clopidogrel bisulfate Other (See Comments)    Codeine Other (See Comments)    Lisinopril Cough  04/20/2014   Penicillins Other (See Comments)     Family History  Problem Relation Age of Onset   Heart disease Other    Hypertension Father  Cancer Father    Hypertension Mother    Hypertension Daughter    Pancreatitis Brother    Hypertension Daughter    Diabetes Daughter     Social History   Socioeconomic History   Marital status: Widowed    Spouse name: Not on file   Number of children: 2   Years of education: GED   Highest education level: GED or equivalent  Occupational History   Occupation: Disabled  Tobacco Use   Smoking status: Former    Packs/day: 1.00    Years: 30.00    Total pack years: 30.00    Types: Cigarettes    Quit date: 12/04/2001    Years since quitting: 20.5    Passive exposure: Never   Smokeless tobacco: Never  Vaping Use   Vaping Use: Never used  Substance and Sexual Activity   Alcohol use: No    Alcohol/week: 6.0 standard drinks of alcohol    Types: 6 Cans of beer per week   Drug use: No   Sexual activity: Not Currently    Birth control/protection: Surgical  Other Topics Concern   Not on file  Social History Narrative   Lives in [place] with mother   Daughters live nearby   Social Determinants of Health   Financial Resource Strain: [place]  (06/09/2022)   Overall Financial Resource Strain (CARDIA)    Difficulty of Paying Living Expenses: Not hard at all  Food Insecurity: No Food Insecurity (12/16/2021)   Hunger Vital Sign    Worried About Running Out of Food in the Last Year: Never true    [place] in the Last Year: Never true  Transportation Needs: No Transportation Needs (06/09/2022)   PRAPARE - Hydrologist (Medical): No    Lack of Transportation (Non-Medical): No  Physical Activity: Inactive (12/16/2021)   Exercise Vital Sign    Days of Exercise per Week: 0 days    Minutes of Exercise per Session: 0 min  Stress: No Stress Concern Present (12/16/2021)   [place]    Feeling of Stress : Not at all  Social Connections: Moderately Isolated (12/16/2021)   Social Connection and Isolation Panel [NHANES]    Frequency of Communication with Friends and Family: More than three times a week    Frequency of Social Gatherings with Friends and Family: More than three times a week    Attends Religious Services: More than 4 times per year    Active Member of [employer] or Organizations: No    Attends Archivist Meetings: Never    Marital Status: Widowed  Intimate Partner Violence: Not At Risk (12/16/2021)   Humiliation, Afraid, Rape, and Kick questionnaire    Fear of Current or Ex-Partner: No    Emotionally Abused: No    Physically Abused: No    Sexually Abused: No    Review of Systems: As per HPI, all others negative  Physical Exam: Vital signs in last 24 hours: Temp:  [97.6 F (36.4 C)-98.2 F (36.8 C)] 98.2 F (36.8 C) (07/08 1051) Pulse Rate:  [80-103] 97 (07/08 1051) Resp:  [13-22] 16 (07/08 1051) BP: (99-167)/(51-80) 130/58 (07/08 1051) SpO2:  [94 %-100 %] 96 % (07/08 1051) Weight:  [77.8 kg-80.7 kg] 77.8 kg (07/08 0430) Last BM Date : 06/09/22 General:   Alert,  Well-developed, well-nourished, pleasant and cooperative in NAD Head:  Normocephalic and atraumatic. Eyes:  Sclera clear, no icterus.  Conjunctiva pale Ears:  Normal auditory acuity. Nose:  No deformity, discharge,  or lesions. Mouth:  No deformity or lesions.  Oropharynx pale and dry Neck:  Supple; no masses or thyromegaly. Heart:  Irregular rate and rhythm; no murmurs, clicks, rubs,  or gallops. Abdomen:  Soft, nontender and nondistended. No masses, hepatosplenomegaly or hernias noted. Normal bowel sounds, without guarding, and without rebound.     Msk:  Symmetrical without gross deformities. Normal posture. Pulses:  Normal pulses noted. Extremities:  Without clubbing or edema. Neurologic:  Alert and  oriented x4;  grossly normal  neurologically. Skin:  Intact without significant lesions or rashes. Psych:  Alert and cooperative. Normal mood and affect.   Lab Results: Recent Labs    06/08/22 2245 06/09/22 2054 06/10/22 1007  WBC 11.0* 9.7 10.9*  HGB 9.5* 8.3* 7.9*  HCT 30.0* 25.9* 23.9*  PLT 479* 496* 443*   BMET Recent Labs    06/08/22 2245 06/09/22 2054 06/10/22 1007  NA 135 135 137  K 3.4* 3.2* 3.4*  CL 93* 93* 94*  CO2 28 28 24   GLUCOSE 163* 224* 127*  BUN 22 38* 41*  CREATININE 4.81* 6.15* 7.58*  CALCIUM 9.1 9.5 8.9   LFT Recent Labs    06/09/22 2054 06/10/22 1007  PROT 5.9*  --   ALBUMIN 3.2* 2.3*  AST 16  --   ALT 36  --   ALKPHOS 75  --   BILITOT 0.4  --   BILIDIR 0.1  --   IBILI 0.3  --    PT/INR No results for input(s): "LABPROT", "INR" in the last 72 hours.  Studies/Results: DG Chest Portable 1 View  Result Date: 06/09/2022 CLINICAL DATA:  Worsening shortness of breath. EXAM: PORTABLE CHEST 1 VIEW COMPARISON:  Radiograph yesterday.  CT 05/15/2022 FINDINGS: Cardiomegaly. Similar central vascular congestion allowing for differences in technique. No convincing pulmonary edema. Increasing atelectasis at the left lung base. No large pleural effusion or pneumothorax. Stable osseous structures. IMPRESSION: 1. Increasing atelectasis at the left lung base. 2. Cardiomegaly and central vascular congestion, unchanged. Electronically Signed   By: Keith Rake M.D.   On: 06/09/2022 21:02   DG Chest 2 View  Result Date: 06/08/2022 CLINICAL DATA:  Shortness of breath EXAM: CHEST - 2 VIEW COMPARISON:  05/30/2022 FINDINGS: Unchanged mild cardiomegaly with pulmonary vascular congestion. No overt pulmonary edema or focal airspace consolidation. No pleural effusion. IMPRESSION: Cardiomegaly and pulmonary vascular congestion without overt pulmonary edema. Electronically Signed   By: Ulyses Jarred M.D.   On: 06/08/2022 19:32    Impression:   Melena. Anemia.  Likely principally acute blood loss,  but also could have other considerations (renal, chronic disease). CAD with recent stent April 2023.  Atrial fibrillation. Chronic anticoagulation, dual Brilinta and Eliquis therapy. Duodenal AVMs s/p cautery and clips 06/02/22 EGD [facility]. ESRD on dialysis T/Th/Sat.  Plan:   Anticoagulation on hold. Cardiology consultation for discussion of interim anticoagulation (heparin drip?) during investigation for GI bleeding. PPI. Serial CBCs, transfuse if needed. Diet soft ok, NPO after midnight. Plan for capsule endoscopy tomorrow; pending these results, we can decide whether or not repeat endoscopy alone (if no other significant small bowel AVMs are seen on capsule) or enteroscopy (if other small bowel AVMs are seen on capsule endoscopy) is needed for further evaluation on Monday. [facility] will follow.   LOS: 0 days   Aleiya Rye M  06/10/2022, 1:32 PM  Cell [phone] If no answer or after 5 PM call [phone]

## 2022-06-10 NOTE — Progress Notes (Signed)
After pt's HD tx was completed and her sites had been held and I had already called report,  and I asked what her pain level was and she stated it was 8/10 b/c her chest was hurting.. I asked her multiple questions to assess whether she was having true CP or muscular pain. Pt stated that it hurt when she inhaled deeply. I asked her was her pain there enough that if she was at home she would go to the ED to receive care and she stated that she would not. I asked her did she feel this was muscular pain that hurt on breathing or true chest pain. She stated it was a tightness and pain on inspiration . I called Dr. Posey Pronto to make him aware of the situation and he  asked were her O2 sats and VS good at this time and I told him they were and he stated that sounded good and she was ok to go back to her room. Called back to the floor and updated Beverlee Nims - charge RN on this new event as well. Pt is awaiting transport back to her room

## 2022-06-10 NOTE — H&P (Addendum)
History and Physical    Patient: [name] ACZ:[id number] DOB: [birth year] DOA: 06/09/2022 DOS: the patient was seen and examined on 06/10/2022 PCP: [name], MD  Patient coming from: Home  Chief Complaint:  Chief Complaint  Patient presents with   Palpitations   Weakness   HPI: [name] is a 71 y.o. female with medical history significant of  CHF with grade 2 diastolic dysfunction, anemia, coronary artery disease, end-stage renal disease with dialysis Tuesday Thursday Saturday, COPD, diabetic neuropathy, GERD, hyperlipidemia, hypertension, insulin-dependent diabetes, stroke, and peripheral vascular disease presented with complaints of chest discomfort and malaise.  She reports that she has been in atrial fibrillation since April.  She just recently hospitalized for symptomatic anemia secondary to GI bleed from 6/27-7/2.  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  She was resumed on Eliquis, Brilinta, and PPI twice daily.  Since getting home patient reported that she had not felt better.  She has felt her heart racing which causes her to have some chest pressure, some lower abdominal pain, and continued black stools.  She has been compliant with hemodialysis and has not missed any treatments.  She has been taking all of her medications as prescribed.   On admission into the emergency department patient was noted to be afebrile, pulse 80-1 03, respirations 13-22, blood pressures 99/61 to 167/68, and O2 saturations currently maintained on 2 L of nasal cannula oxygen.  Labs from 7/7 revealed hemoglobin 8.3, platelets 493, potassium 3.2, BUN 38, creatinine 6.15, glucose 224, and HS troponin 33->46.  Chest x-ray revealed increased atelectasis in the left lung base, cardiomegaly with unchanged central vascular congestion.  Stool guaiacs were noted to be positive.  GI  have been consulted and patient was started on Protonix 40 mg IV twice daily  Review of Systems: As mentioned in the history of present illness. All other systems reviewed and are negative. Past Medical History:  Diagnosis Date   Acute on chronic diastolic CHF (congestive heart failure) ([place]) 03/05/2015   Grade 2. EF 60-65%   Anemia    hx of   Anxiety    Arthritis    CAD (coronary artery disease)    s/p Stenting in 2005;  [place] 7/09: Vigorous LV function, 2-3+ MR, RI 40%, RI stent patent, proximal-mid RCA 40-50%;  Echo 7/09:  EF 55-65%, trivial MR;  [place] 11/18/12: EF 66%, normal LV wall motion, mild to moderate anterior ischemia - reviewed by [facility] and felt to rep breast atten (low risk);  Echo 6/14: mild LVH, EF 55-65%, Gr 2 DD, PASP 44, trivial eff    Chronic kidney disease    CREATININE IS UP--GOING TO KIDNEY MD    Chronic neck pain    COPD (chronic obstructive pulmonary disease) (HCC)    Degenerative joint disease    left shoulder   Diabetic neuropathy (HCC)    Diverticulosis    Esophageal stricture    GERD (gastroesophageal reflux disease)    Hyperlipidemia    Hypertension    IDDM (insulin dependent diabetes mellitus)    Pericardial effusion 03/06/2015   Very small per echo ; pleural nodules seen on CT chest.   Peripheral vascular disease ([place])    Stroke ([place])    "mini- years ago"   Vitamin D deficiency    Past Surgical History:  Procedure Laterality Date   A/V FISTULAGRAM N/A 08/10/2017  Procedure: A/V Fistulagram - Right Arm;  Surgeon: Elam Dutch, MD;  Location: [place] [facility];  Service: Cardiovascular;  Laterality: N/A;   A/V FISTULAGRAM N/A 03/03/2020   Procedure: A/V FISTULAGRAM - Right Arm;  Surgeon: Marty Heck, MD;  Location: [place] [facility];  Service: Cardiovascular;  Laterality: N/A;   ABDOMINAL HYSTERECTOMY     AV FISTULA PLACEMENT Right 07/20/2015   Procedure: RADIAL CEPHALIC ARTERIOVENOUS FISTULA CREATION RIGHT ARM;  Surgeon: Angelia Mould, MD;  Location: [place];  Service: Vascular;  Laterality: Right;   AV FISTULA PLACEMENT Right 11/26/2015   Procedure:  Creation of RIGHT BRACHIO-CEPHALIC arteriovenous fistula;  Surgeon: Angelia Mould, MD;  Location: [facility] OR;  Service: Vascular;  Laterality: Right;   BACK SURGERY     2007 & 2013   [place]  2003, 2005, 2009   1 stent   cardiac stents     CARDIOVERSION N/A 04/24/2022   Procedure: CARDIOVERSION;  Surgeon: Fay Records, MD;  Location: [facility] ENDOSCOPY;  Service: Cardiovascular;  Laterality: N/A;   CATARACT EXTRACTION W/PHACO Right 05/04/2014   Procedure: CATARACT EXTRACTION PHACO AND INTRAOCULAR LENS PLACEMENT ([place]);  Surgeon: Williams Che, MD;  Location: [facility];  Service: Ophthalmology;  Laterality: Right;  CDE 1.47   CATARACT EXTRACTION W/PHACO Left 07/20/2014   Procedure: CATARACT EXTRACTION WITH PHACO AND INTRAOCULAR LENS PLACEMENT LEFT EYE CDE=1.79;  Surgeon: Williams Che, MD;  Location: [facility];  Service: Ophthalmology;  Laterality: Left;   CERVICAL SPINE SURGERY  2012   8 screws   CORONARY ATHERECTOMY N/A 03/08/2022   Procedure: CORONARY ATHERECTOMY;  Surgeon: Sherren Mocha, MD;  Location: [place] [facility];  Service: Cardiovascular;  Laterality: N/A;   CYSTOSCOPY WITH INJECTION N/A 06/03/2013   Procedure: MACROPLASTIQUE  INJECTION ;  Surgeon: Malka So, MD;  Location: [facility];  Service: Urology;  Laterality: N/A;   ESOPHAGEAL DILATION Right 06/02/2022   Procedure: ESOPHAGEAL DILATION;  Surgeon: Harvel Quale, MD;  [facility];  Service: Gastroenterology;  Laterality: Right;   ESOPHAGOGASTRODUODENOSCOPY (EGD) WITH PROPOFOL N/A 06/02/2022   Procedure: ESOPHAGOGASTRODUODENOSCOPY (EGD) WITH PROPOFOL;  Surgeon: Harvel Quale, MD;  [facility];  Service: Gastroenterology;  Laterality: N/A;  TO BE DONE IN OR 2   FISTULOGRAM Right 10/20/2016   Procedure: FISTULOGRAM WITH  POSSIBLE INTERVENTION;  Surgeon: [name], MD;  Location: [facility];  Service: Vascular;  Laterality: Right;   HEMOSTASIS CLIP PLACEMENT  06/02/2022   Procedure: HEMOSTASIS CLIP PLACEMENT;  Surgeon: Harvel Quale, MD;  [facility];  Service: Gastroenterology;;   HOT HEMOSTASIS  06/02/2022   Procedure: HOT HEMOSTASIS (ARGON PLASMA COAGULATION/BICAP);  Surgeon: Montez Morita, Quillian Quince, MD;  [facility];  Service: Gastroenterology;;   INSERTION OF DIALYSIS CATHETER N/A 11/26/2015   Procedure: INSERTION OF DIALYSIS CATHETER;  Surgeon: Angelia Mould, MD;  Location: [place];  Service: Vascular;  Laterality: N/A;   INTRAVASCULAR IMAGING/OCT N/A 03/08/2022   Procedure: INTRAVASCULAR IMAGING/OCT;  Surgeon: Sherren Mocha, MD;  Location: [place] [facility];  Service: Cardiovascular;  Laterality: N/A;   LAMINECTOMY  12/29/2011   LEFT HEART CATH AND CORONARY ANGIOGRAPHY N/A 03/06/2022   Procedure: LEFT HEART CATH AND CORONARY ANGIOGRAPHY;  Surgeon: Nelva Bush, MD;  Location: [place] [facility];  Service: Cardiovascular;  Laterality: N/A;   LUMBAR LAMINECTOMY/DECOMPRESSION MICRODISCECTOMY  12/29/2011   Procedure: LUMBAR LAMINECTOMY/DECOMPRESSION MICRODISCECTOMY;  Surgeon: Floyce Stakes, MD;  Location: [facility] NEURO ORS;  Service: Neurosurgery;  Laterality: N/A;  Lumbar Two through Lumbar Five Laminectomies/Cellsaver   PARTIAL HYSTERECTOMY     PERIPHERAL VASCULAR BALLOON ANGIOPLASTY  03/03/2020   Procedure: PERIPHERAL VASCULAR BALLOON ANGIOPLASTY;  Surgeon: Marty Heck, MD;  Location: [place] [facility];  Service: Cardiovascular;;  Rt arm fistula   PERIPHERAL VASCULAR BALLOON ANGIOPLASTY Right 04/15/2021   Procedure: PERIPHERAL VASCULAR BALLOON ANGIOPLASTY;  Surgeon: Angelia Mould, MD;  Location: [place] [facility];  Service: Cardiovascular;  Laterality: Right;  arm fistula   PERIPHERAL VASCULAR BALLOON ANGIOPLASTY  03/14/2022   Procedure: PERIPHERAL  VASCULAR BALLOON ANGIOPLASTY;  Surgeon: Broadus John, MD;  Location: [place] [facility];  Service: Cardiovascular;;  right arm AVF   PERIPHERAL VASCULAR CATHETERIZATION N/A 11/22/2015   Procedure: Fistulagram;  Surgeon: Angelia Mould, MD;  Location: [place] [facility];  Service: Cardiovascular;  Laterality: N/A;   PERIPHERAL VASCULAR INTERVENTION  08/10/2017   Procedure: PERIPHERAL VASCULAR INTERVENTION;  Surgeon: Elam Dutch, MD;  Location: [place] [facility];  Service: Cardiovascular;;   SHOULDER SURGERY Right    Rotator cuff   Social History:  reports that she quit smoking about 20 years ago. Her smoking use included cigarettes. She has a 30.00 pack-year smoking history. She has never been exposed to tobacco smoke. She has never used smokeless tobacco. She reports that she does not drink alcohol and does not use drugs.  Allergies  Allergen Reactions   Azithromycin     Prolonged QT   Ace Inhibitors Cough   Clopidogrel Bisulfate Other (See Comments)    Sick, Headache, "Felt terrible"   Codeine Other (See Comments)    hallucinations   Lisinopril Cough   Penicillins Other (See Comments)    Unknown childhood reaction Did it involve swelling of the face/tongue/throat, SOB, or low BP? Unknown Did it involve sudden or severe rash/hives, skin peeling, or any reaction on the inside of your mouth or nose? Unknown Did you need to seek medical attention at a hospital or doctor's office? Unknown When did it last happen?      childhood allergy If all above answers are "NO", may proceed with cephalosporin use.      Family History  Problem Relation Age of Onset   Heart disease Other    Hypertension Father    Cancer Father    Hypertension Mother    Hypertension Daughter    Pancreatitis Brother    Hypertension Daughter    Diabetes Daughter     Prior to Admission medications   Medication Sig Start Date End Date Taking? Authorizing Provider  acetaminophen (TYLENOL) 500 MG  tablet Take 1,000 mg by mouth every 6 (six) hours as needed for moderate pain or headache.    [provider]  albuterol (PROVENTIL) (2.5 MG/3ML) 0.083% nebulizer solution Take 3 mLs (2.5 mg total) by nebulization every 6 (six) hours as needed for wheezing or shortness of breath. 12/02/21   [name], MD  albuterol (VENTOLIN HFA) 108 (90 Base) MCG/ACT inhaler Inhale 2 puffs into the lungs every 6 (six) hours as needed for wheezing or shortness of breath. 12/02/21   [name], MD  apixaban (ELIQUIS) 5 MG TABS tablet Take 1 tablet (5 mg total) by mouth 2 (two) times daily. 04/05/22   Minus Breeding, MD  atenolol (TENORMIN) 100 MG tablet Take 1 tablet (100 mg total) by mouth at bedtime. 05/10/22   [name], MD  budesonide-formoterol (  SYMBICORT) 160-4.5 MCG/ACT inhaler Inhale 2 puffs into the lungs 2 (two) times daily. 05/03/22   [name], MD  calcium acetate (PHOSLO) 667 MG capsule Take 667-1,334 mg by mouth See admin instructions. Take 1334 mg with meals 3 times daily and 667 mg with snacks    [provider]  Carboxymethylcellul-Glycerin (LUBRICATING EYE DROPS OP) Place 1 drop into both eyes daily as needed (dry eyes).    [provider]  cetirizine (ZYRTEC) 10 MG tablet Take 1 tablet (10 mg total) by mouth daily as needed for allergies. Patient taking differently: Take 10 mg by mouth daily. 02/08/22   [name], MD  Continuous Blood Gluc Receiver (FREESTYLE LIBRE 2 READER) DEVI USE TO TEST BLOOD SUGAR CONTINUOUSLY 11/15/21   [name], MD  Continuous Blood Gluc Sensor (FREESTYLE LIBRE 2 SENSOR) MISC Use multiple times daily to track glucose to prevent highs and lows as a complication of dialysis Dx:Z99.2, E11.59 09/15/21   [name], MD  dextromethorphan-guaiFENesin (MUCINEX DM) 30-600 MG 12hr tablet Take 1 tablet by mouth 2 (two) times daily. Patient not taking: Reported on 05/30/2022 04/28/22   Barton Dubois, MD  diltiazem (CARDIZEM CD) 240 MG  24 hr capsule Take 1 capsule (240 mg total) by mouth daily. 05/17/22   Minus Breeding, MD  gabapentin (NEURONTIN) 400 MG capsule Take 2 capsules (800 mg total) by mouth See admin instructions. Take it after HD Patient taking differently: Take 800 mg by mouth See admin instructions. Take after dialysis 03/22/22   [name], MD  glucose blood (ONETOUCH ULTRA) test strip Use to test blood sugar 3 times daily as directed. DX: E11.9 09/13/20   [name], MD  hydrOXYzine (ATARAX) 50 MG tablet Take 1 tablet (50 mg total) by mouth 3 (three) times daily as needed for anxiety. 03/22/22   [name], MD  insulin aspart (NOVOLOG FLEXPEN) 100 UNIT/ML FlexPen Inject 1 Units into the skin 3 (three) times daily with meals. < 150: 0 units. 150-199:1 unit. 200-249: 2 units. 250-299: 3 units. 300-349: 4 units. Over 350: 5 units 05/19/22   Rakes, Connye Burkitt, FNP  insulin degludec (TRESIBA FLEXTOUCH) 100 UNIT/ML FlexTouch Pen Inject 20 Units into the skin at bedtime. Via novo nordisk patient assistance program Patient taking differently: Inject 30 Units into the skin at bedtime. Via novo nordisk patient assistance program 05/03/22   [name], MD  lidocaine-prilocaine (EMLA) cream Apply 1 application. topically as needed (dialysis).    [provider]  multivitamin (RENA-VIT) TABS tablet Take 1 tablet by mouth daily. 05/13/22   [provider]  nitroGLYCERIN (NITROSTAT) 0.4 MG SL tablet Place 1 tablet (0.4 mg total) under the tongue every 5 (five) minutes as needed for chest pain. 11/09/21   Minus Breeding, MD  omeprazole (PRILOSEC) 20 MG capsule Take 1 capsule (20 mg total) by mouth daily. On an empty stomach 05/10/22   [name], MD  ondansetron (ZOFRAN) 4 MG tablet TAKE 1 TABLET BY MOUTH EVERY 8 HOURS AS NEEDED FOR NAUSEA AND VOMITING Patient taking differently: Take 4 mg by mouth every 8 (eight) hours as needed for nausea or vomiting. 05/19/22   Baruch Gouty, FNP  pantoprazole  (PROTONIX) 40 MG tablet Take 1 tablet (40 mg total) by mouth 2 (two) times daily. 06/04/22 08/03/22  Manuella Ghazi, Pratik D, DO  polyethylene glycol (MIRALAX / GLYCOLAX) 17 g packet Take 17 g by mouth daily as needed for moderate constipation or mild constipation. 03/10/22   Pokhrel, Corrie Mckusick, MD  QUEtiapine (  SEROQUEL) 200 MG tablet Take 1 tablet (200 mg total) by mouth at bedtime. For sleep and for anxiety 03/17/22   [name], MD  rOPINIRole (REQUIP) 1 MG tablet Take 1 tablet (1 mg total) by mouth at bedtime. For leg cramps 09/15/21   [name], MD  rosuvastatin (CRESTOR) 5 MG tablet Take 1 tablet (5 mg total) by mouth daily. For cholesterol Patient taking differently: Take 5 mg by mouth at bedtime. For cholesterol 12/12/21   [name], MD  Semaglutide,0.25 or 0.5MG /DOS, (OZEMPIC, 0.25 OR 0.5 MG/DOSE,) 2 MG/1.5ML SOPN Inject 0.5 mg into the skin every Friday.    [provider]  ticagrelor (BRILINTA) 90 MG TABS tablet Take 1 tablet (90 mg total) by mouth 2 (two) times daily. 06/04/22 07/04/22  Heath Lark D, DO    Physical Exam: Vitals:   06/10/22 0330 06/10/22 0430 06/10/22 0431 06/10/22 0724  BP: (!) 167/68  (!) 135/56 (!) 141/61  Pulse: 100   81  Resp: 14   15  Temp:   98.2 F (36.8 C) 98.2 F (36.8 C)  TempSrc:   Oral Oral  SpO2: 94%  96% 100%  Weight:  77.8 kg    Height:  5\' 3"  (1.6 m)       Constitutional: Elderly female currently receiving hemodialysis Eyes: PERRL, lids and conjunctivae normal ENMT: Mucous membranes are moist.   Neck: normal, supple, no JVD appreciated Respiratory: Normal respiratory effort without significant wheezes or rhonchi appreciated.  Patient currently on 2 L nasal cannula oxygen with O2 saturation maintained Cardiovascular: Regular rate and rhythm, no murmurs / rubs / gallops.  Trace lower extremity edema.  Right upper extremity fistula in place. Abdomen: no tenderness, no masses palpated.  Bowel sounds positive.  Musculoskeletal: no clubbing /  cyanosis. No joint deformity upper and lower extremities.   Skin: no rashes, lesions, ulcers. No induration Neurologic: CN 2-12 grossly intact.  Strength 5/5 in all 4.  Psychiatric: Normal judgment and insight. Alert and oriented x 3. Normal mood.   Data Reviewed:  Reviewed labs, imaging, and pertinent records as noted above in HPI  Assessment and Plan: Acute blood loss anemia secondary to GI bleed Patient was just recently discharged from hospital for symptomatic anemia secondary to GI bleed.  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  Since that time patient reported that she still had been having some dark stools and had been weak.  Hemoglobin had been 9.5 on 7/6, but repeat check 8.3 yesterday.  Stool guaiacs were noted to be positive. -Admit to a medical telemetry bed -Type and screen for possible need of blood product -Serial monitoring of H&H -Clear liquid diet  -Hold Brilinta and Eliquis -Appreciate Dr. Paulita Fujita GI consultative services,will follow-up for any further recommendation  ESRD on HD  Patient normally dialyzes Tuesday, Thursday, Saturday.  She last dialyzed on 7/6. -Dr. Posey Pronto of nephrology consulted. HD per their orders  Permanent atrial fibrillation on chronic anticoagulation Patient appears to be currently rate controlled. -Holding anticoagulation in the acute setting  CAD s/p PCI Patient with recent stent placed in April.  She had been on Brilinta, aspirin, and Eliquis.  After recent hospitalization for symptomatic anemia aspirin was stopped, but she had been continued on Brilinta and Eliquis. -Cardiology consulted we will follow-up for any further recommendations  Heart failure with preserved EF Chronic.  Patient does not appear grossly fluid overloaded at this time. -Fluid management with  HD  Elevated troponin Chronic.  Patient did report having some episodes of sternal chest pressure.  High-sensitivity troponin 33->46, but previously had been 89->92 during last hospitalization.  Suspect symptoms could be related to patient's anemia in the setting of her end-stage renal disease, but had not recently had stent placed in April. -Continue to monitor  Essential hypertension -Continue home blood pressure regimen  Diabetes mellitus type 2 with hyperglycemia On admission glucose of 224.  Last available hemoglobin A1c 8 on 03/06/2022.  Home insulin regimen includes 30 units nightly, semaglutide 0.5 mg q. Friday, and a very sensitive sliding scale insulin with meals. -Hypoglycemic protocols -Reduced long-acting insulin to 10 units due to patient being n.p.o. for prolonged period of time -CBGs before every meal with very sensitive SSI  Mixed hyperlipidemia -Continue Crestor   GAD (generalized anxiety disorder) -Continue Atarax   COPD (chronic obstructive pulmonary disease) ([place]) -Continue pharmacy substitution Symbicort and as needed albuterol   PAD (peripheral artery disease) (HCC) Aspirin was discontinued during last hospitalization. -Continue Crestor  GERD -Protonix 40 mg twice daily  Advance Care Planning:   Code Status: Prior   Consults: Nephrology, GI, cardiology  Family Communication: None requested  Severity of Illness: The appropriate patient status for this patient is INPATIENT. Inpatient status is judged to be reasonable and necessary in order to provide the required intensity of service to ensure the patient's safety. [name] presenting symptoms, physical exam findings, and initial radiographic and laboratory data in the context of their chronic comorbidities is felt to place them at high risk for further clinical deterioration. Furthermore, it is not anticipated that the patient will be medically stable for discharge from the hospital within 2 midnights of  admission.   * I certify that at the point of admission it is my clinical judgment that the patient will require inpatient hospital care spanning beyond 2 midnights from the point of admission due to high intensity of service, high risk for further deterioration and high frequency of surveillance required.*  Author: Norval Morton, MD 06/10/2022 9:47 AM  For on call review [email or web address].

## 2022-06-11 ENCOUNTER — Encounter (HOSPITAL_COMMUNITY)
Admission: EM | Disposition: A | Discharge status: Home / Self Care | Payer: Self-pay | Source: Home / Self Care | Attending: Internal Medicine

## 2022-06-11 ENCOUNTER — Other Ambulatory Visit: Payer: Self-pay

## 2022-06-11 ENCOUNTER — Encounter (HOSPITAL_COMMUNITY): Payer: Self-pay | Admitting: Gastroenterology

## 2022-06-11 DIAGNOSIS — D62 Acute posthemorrhagic anemia: Secondary | ICD-10-CM | POA: Diagnosis not present

## 2022-06-11 DIAGNOSIS — E1169 Type 2 diabetes mellitus with other specified complication: Secondary | ICD-10-CM

## 2022-06-11 DIAGNOSIS — F419 Anxiety disorder, unspecified: Secondary | ICD-10-CM

## 2022-06-11 DIAGNOSIS — I2510 Atherosclerotic heart disease of native coronary artery without angina pectoris: Secondary | ICD-10-CM | POA: Diagnosis not present

## 2022-06-11 DIAGNOSIS — I4821 Permanent atrial fibrillation: Secondary | ICD-10-CM | POA: Diagnosis not present

## 2022-06-11 DIAGNOSIS — K922 Gastrointestinal hemorrhage, unspecified: Secondary | ICD-10-CM | POA: Diagnosis not present

## 2022-06-11 DIAGNOSIS — N186 End stage renal disease: Secondary | ICD-10-CM | POA: Diagnosis not present

## 2022-06-11 DIAGNOSIS — E785 Hyperlipidemia, unspecified: Secondary | ICD-10-CM

## 2022-06-11 HISTORY — PX: GIVENS CAPSULE STUDY: SHX5432

## 2022-06-11 LAB — RENAL FUNCTION PANEL
Albumin: 2.2 g/dL — ABNORMAL LOW (ref 3.5–5.0)
Anion gap: 13 (ref 5–15)
BUN: 14 mg/dL (ref 8–23)
CO2: 28 mmol/L (ref 22–32)
Calcium: 8.6 mg/dL — ABNORMAL LOW (ref 8.9–10.3)
Chloride: 95 mmol/L — ABNORMAL LOW (ref 98–111)
Creatinine, Ser: 4.55 mg/dL — ABNORMAL HIGH (ref 0.44–1.00)
GFR, Estimated: 10 mL/min — ABNORMAL LOW (ref >60)
Glucose, Bld: 147 mg/dL — ABNORMAL HIGH (ref 70–99)
Phosphorus: 3.4 mg/dL (ref 2.5–4.6)
Potassium: 3.2 mmol/L — ABNORMAL LOW (ref 3.5–5.1)
Sodium: 136 mmol/L (ref 135–145)

## 2022-06-11 LAB — CBC
HCT: 25.8 % — ABNORMAL LOW (ref 36.0–46.0)
Hemoglobin: 8.3 g/dL — ABNORMAL LOW (ref 12.0–15.0)
MCH: 27.5 pg (ref 26.0–34.0)
MCHC: 32.2 g/dL (ref 30.0–36.0)
MCV: 85.4 fL (ref 80.0–100.0)
Platelets: 450 10*3/uL — ABNORMAL HIGH (ref 150–400)
RBC: 3.02 MIL/uL — ABNORMAL LOW (ref 3.87–5.11)
RDW: 17.2 % — ABNORMAL HIGH (ref 11.5–15.5)
WBC: 9.6 10*3/uL (ref 4.0–10.5)
nRBC: 0.9 % — ABNORMAL HIGH (ref 0.0–0.2)

## 2022-06-11 LAB — GLUCOSE, CAPILLARY
Glucose-Capillary: 290 mg/dL — ABNORMAL HIGH (ref 70–99)
Glucose-Capillary: 144 mg/dL — ABNORMAL HIGH (ref 70–99)

## 2022-06-11 SURGERY — IMAGING PROCEDURE, GI TRACT, INTRALUMINAL, VIA CAPSULE
Anesthesia: LOCAL

## 2022-06-11 MED ORDER — PANTOPRAZOLE SODIUM 40 MG PO TBEC
40.0000 mg | DELAYED_RELEASE_TABLET | Freq: Two times a day (BID) | ORAL | Status: DC
  Administered 2022-06-11 – 2022-06-14 (×5): 40 mg via ORAL
  Filled 2022-06-11 (×6): qty 1

## 2022-06-11 MED ORDER — INSULIN ASPART 100 UNIT/ML IJ SOLN
0.0000 [IU] | Freq: Three times a day (TID) | INTRAMUSCULAR | Status: DC
  Administered 2022-06-11: 5 [IU] via SUBCUTANEOUS
  Administered 2022-06-12: 2 [IU] via SUBCUTANEOUS

## 2022-06-11 SURGICAL SUPPLY — 1 items: TOWEL COTTON PACK 4EA (MISCELLANEOUS) ×4 IMPLANT

## 2022-06-11 NOTE — Progress Notes (Signed)
Pt requests Melatonin for sleep tonight. Pt states she was unable to sleep last night despite her scheduled meds and PRN Hydroxyzine. MD paged.

## 2022-06-11 NOTE — Assessment & Plan Note (Signed)
Hypokalemia.   Patient euvolemic.  Plan to continue renal replacement therapy per nephrology recommendations.  Add 20 kcl po x1 and follow up renal function and electrolytes in am.

## 2022-06-11 NOTE — Assessment & Plan Note (Signed)
No chest pain, continue blood pressure control.  

## 2022-06-11 NOTE — Assessment & Plan Note (Addendum)
Continue close glucose cover monitoring Her fasting glucose this am is 147  Capillary 223 and 231.   Plan to add insulin sliding scale for glucose cover and monitoring.  Continue basal insulin with 10 units glargine.   Continue with statin therapy.

## 2022-06-11 NOTE — Assessment & Plan Note (Signed)
Continue with ropinarole and quetiapine.

## 2022-06-11 NOTE — Progress Notes (Signed)
Patient ID: [name], female   DOB: [birth year], 71 y.o.   MRN: [id number] S: No new complaints O:BP 115/71   Pulse 95   Temp 98.1 F (36.7 C) (Oral)   Resp 18   Ht 5\' 3"  (1.6 m)   Wt 76 kg   SpO2 100%   BMI 29.68 kg/m   Intake/Output Summary (Last 24 hours) at 06/11/2022 1013 Last data filed at 06/10/2022 1826 Gross per 24 hour  Intake --  Output 2900 ml  Net -2900 ml   Intake/Output: I/O last 3 completed shifts: In: -  Out: 2900 [Other:2900]  Intake/Output this shift:  No intake/output data recorded. Weight change: -1.84 kg Gen: NAD CVS: IRR IRR Resp: CTA Abd: +BS, soft, NT/nd Ext: no edema, RUE AVF +T/B  Recent Labs  Lab 06/08/22 2245 06/09/22 2054 06/10/22 1007 06/11/22 0217  NA 135 135 137 136  K 3.4* 3.2* 3.4* 3.2*  CL 93* 93* 94* 95*  CO2 28 28 24 28   GLUCOSE 163* 224* 127* 147*  BUN 22 38* 41* 14  CREATININE 4.81* 6.15* 7.58* 4.55*  ALBUMIN 2.4* 3.2* 2.3* 2.2*  CALCIUM 9.1 9.5 8.9 8.6*  PHOS  --   --  4.8* 3.4  AST 27 16  --   --   ALT 49* 36  --   --    Liver Function Tests: Recent Labs  Lab 06/08/22 2245 06/09/22 2054 06/10/22 1007 06/11/22 0217  AST 27 16  --   --   ALT 49* 36  --   --   ALKPHOS 80 75  --   --   BILITOT 0.7 0.4  --   --   PROT 6.0* 5.9*  --   --   ALBUMIN 2.4* 3.2* 2.3* 2.2*   No results for input(s): "LIPASE", "AMYLASE" in the last 168 hours. No results for input(s): "AMMONIA" in the last 168 hours. CBC: Recent Labs  Lab 06/08/22 2245 06/09/22 2054 06/10/22 1007 06/10/22 2008 06/11/22 0217  WBC 11.0* 9.7 10.9*  --  9.6  NEUTROABS 7.9* 7.1  --   --   --   HGB 9.5* 8.3* 7.9* 9.0* 8.3*  HCT 30.0* 25.9* 23.9* 28.4* 25.8*  MCV 86.0 85.2 84.8  --  85.4  PLT 479* 496* 443*  --  450*   Cardiac Enzymes: No results for input(s): "CKTOTAL", "CKMB", "CKMBINDEX", "TROPONINI" in the last 168 hours. CBG: Recent Labs  Lab 06/04/22 1136 06/09/22 2046 06/10/22 2159  GLUCAP 204* 231* 223*    Iron Studies: No  results for input(s): "IRON", "TIBC", "TRANSFERRIN", "FERRITIN" in the last 72 hours. Studies/Results: DG Chest Portable 1 View  Result Date: 06/09/2022 CLINICAL DATA:  Worsening shortness of breath. EXAM: PORTABLE CHEST 1 VIEW COMPARISON:  Radiograph yesterday.  CT 05/15/2022 FINDINGS: Cardiomegaly. Similar central vascular congestion allowing for differences in technique. No convincing pulmonary edema. Increasing atelectasis at the left lung base. No large pleural effusion or pneumothorax. Stable osseous structures. IMPRESSION: 1. Increasing atelectasis at the left lung base. 2. Cardiomegaly and central vascular congestion, unchanged. Electronically Signed   By: Keith Rake M.D.   On: 06/09/2022 21:02    atenolol  100 mg Oral QHS   calcium acetate  1,334 mg Oral TID with meals   calcium acetate  667 mg Oral With snacks   Chlorhexidine Gluconate Cloth  6 each Topical Q0600   diltiazem  240 mg Oral Daily   [START ON 06/13/2022] gabapentin  800 mg Oral Q T,Th,Sa-HD  insulin glargine-yfgn  10 Units Subcutaneous QHS   mometasone-formoterol  2 puff Inhalation BID   multivitamin  1 tablet Oral Daily   pantoprazole (PROTONIX) IV  40 mg Intravenous BID   QUEtiapine  200 mg Oral QHS   rOPINIRole  1 mg Oral QHS   rosuvastatin  5 mg Oral QHS   sodium chloride flush  3 mL Intravenous Q12H    BMET    Component Value Date/Time   NA 136 06/11/2022 0217   NA 137 04/19/2022 1456   K 3.2 (L) 06/11/2022 0217   CL 95 (L) 06/11/2022 0217   CO2 28 06/11/2022 0217   GLUCOSE 147 (H) 06/11/2022 0217   BUN 14 06/11/2022 0217   BUN 24 04/19/2022 1456   CREATININE 4.55 (H) 06/11/2022 0217   CREATININE 1.66 (H) 03/25/2013 1026   CALCIUM 8.6 (L) 06/11/2022 0217   GFRNONAA 10 (L) 06/11/2022 0217   GFRAA 4 (L) 11/01/2020 1022   CBC    Component Value Date/Time   WBC 9.6 06/11/2022 0217   RBC 3.02 (L) 06/11/2022 0217   HGB 8.3 (L) 06/11/2022 0217   HGB 10.0 (L) 04/19/2022 1456   HCT 25.8 (L)  06/11/2022 0217   HCT 31.9 (L) 04/19/2022 1456   PLT 450 (H) 06/11/2022 0217   PLT 300 04/19/2022 1456   MCV 85.4 06/11/2022 0217   MCV 89 04/19/2022 1456   MCH 27.5 06/11/2022 0217   MCHC 32.2 06/11/2022 0217   RDW 17.2 (H) 06/11/2022 0217   RDW 15.4 04/19/2022 1456   LYMPHSABS 1.1 06/09/2022 2054   LYMPHSABS 1.8 03/17/2022 1136   MONOABS 1.0 06/09/2022 2054   EOSABS 0.3 06/09/2022 2054   EOSABS 0.5 (H) 03/17/2022 1136   BASOSABS 0.1 06/09/2022 2054   BASOSABS 0.1 03/17/2022 1136    Dialysis Orders:  Unit: [facility] Schedule/Time: 4:00  EDW: 78.5 kg Flows: 350/500 Bath: 2K/2.5Ca Access: AVF Heparin: 1000 bolus then 1000/hr ESA: Mircera 200 q 2 weeks (last dosed on 7/4) VDRA: None   Assessment/Plan: Melena/Recurrent GIB. S/p EGD with AVMs on last admission. GI consulted.  Capsule endoscopy tomorrow. ESRD -   HD TTS.  Continue on schedule --Hold heparin Hypokalemia - Used added K bath today  Hypertension/volume  - BP ok. Some volume on exam. Challenge EDW today as tolerated.  Anemia  -Hgb 7.9 s/p transfusion last admission. Follow trends. Received ESA at OP unit on 7/4.  Metabolic bone disease -  Ca/Phos ok. Continue home meds Permanent AFib - Rate controlled. Eliquis held. States OP cardiology was planning ablation.  Nutrition - Renal diet with fluid restriction   Donetta Potts, MD [facility]

## 2022-06-11 NOTE — Progress Notes (Signed)
Cardiology Progress Note  Patient ID: [name] MRN: [id number] DOB: 11-27-1951 Date of Encounter: 06/11/2022  Primary Cardiologist: Minus Breeding, MD  Subjective   Chief Complaint: Fatigue chest pain  HPI: Continues to report tightness in her chest and fatigue.  Status post capsule endoscopy.  Awaiting results.  Hemoglobin stable.  ROS:  All other ROS reviewed and negative. Pertinent positives noted in the HPI.     Inpatient Medications  Scheduled Meds:  atenolol  100 mg Oral QHS   calcium acetate  1,334 mg Oral TID with meals   calcium acetate  667 mg Oral With snacks   Chlorhexidine Gluconate Cloth  6 each Topical Q0600   diltiazem  240 mg Oral Daily   [START ON 06/13/2022] gabapentin  800 mg Oral Q T,Th,Sa-HD   insulin glargine-yfgn  10 Units Subcutaneous QHS   mometasone-formoterol  2 puff Inhalation BID   multivitamin  1 tablet Oral Daily   pantoprazole (PROTONIX) IV  40 mg Intravenous BID   QUEtiapine  200 mg Oral QHS   rOPINIRole  1 mg Oral QHS   rosuvastatin  5 mg Oral QHS   sodium chloride flush  3 mL Intravenous Q12H   Continuous Infusions:  PRN Meds: acetaminophen **OR** acetaminophen, albuterol, carboxymethylcellul-glycerin, hydrOXYzine, lidocaine-prilocaine, loratadine   Vital Signs   Vitals:   06/10/22 1955 06/11/22 0515 06/11/22 0520 06/11/22 0800  BP: 135/64 (!) 113/99 115/71   Pulse: (!) 123 94 95   Resp: 19 18    Temp: 98.2 F (36.8 C) 98.1 F (36.7 C)    TempSrc: Oral Oral    SpO2: 99% 100% 100%   Weight:    76 kg  Height:    5\' 3"  (1.6 m)    Intake/Output Summary (Last 24 hours) at 06/11/2022 1029 Last data filed at 06/10/2022 1826 Gross per 24 hour  Intake --  Output 2900 ml  Net -2900 ml      06/11/2022    8:00 AM 06/10/2022    6:55 PM 06/10/2022    1:55 PM  Last 3 Weights  Weight (lbs) 167 lb 8.8 oz 167 lb 8.8 oz 173 lb 15.1 oz  Weight (kg) 76 kg 76 kg 78.9 kg      Telemetry  Overnight telemetry shows A-fib heart rate in the  90s, which I personally reviewed.    Physical Exam   Vitals:   06/10/22 1955 06/11/22 0515 06/11/22 0520 06/11/22 0800  BP: 135/64 (!) 113/99 115/71   Pulse: (!) 123 94 95   Resp: 19 18    Temp: 98.2 F (36.8 C) 98.1 F (36.7 C)    TempSrc: Oral Oral    SpO2: 99% 100% 100%   Weight:    76 kg  Height:    5\' 3"  (1.6 m)    Intake/Output Summary (Last 24 hours) at 06/11/2022 1029 Last data filed at 06/10/2022 1826 Gross per 24 hour  Intake --  Output 2900 ml  Net -2900 ml       06/11/2022    8:00 AM 06/10/2022    6:55 PM 06/10/2022    1:55 PM  Last 3 Weights  Weight (lbs) 167 lb 8.8 oz 167 lb 8.8 oz 173 lb 15.1 oz  Weight (kg) 76 kg 76 kg 78.9 kg    Body mass index is 29.68 kg/m.  General: Well nourished, well developed, in no acute distress Head: Atraumatic, normal size  Eyes: PEERLA, EOMI  Neck: Supple, no JVD Endocrine: No thryomegaly Cardiac: Normal  S1, S2; irregular rhythm, no murmurs Lungs: Clear to auscultation bilaterally, no wheezing, rhonchi or rales  Abd: Soft, nontender, no hepatomegaly  Ext: No edema, pulses 2+ Musculoskeletal: No deformities, BUE and BLE strength normal and equal Skin: Warm and dry, no rashes   Neuro: Alert and oriented to person, place, time, and situation, CNII-XII grossly intact, no focal deficits  Psych: Normal mood and affect   Labs  High Sensitivity Troponin:   Recent Labs  Lab 05/30/22 1803 05/30/22 1927 06/08/22 2245 06/09/22 2219 06/10/22 0454  TROPONINIHS 89* 92* 53* 33* 46*     Cardiac EnzymesNo results for input(s): "TROPONINI" in the last 168 hours. No results for input(s): "TROPIPOC" in the last 168 hours.  Chemistry Recent Labs  Lab 06/08/22 2245 06/09/22 2054 06/10/22 1007 06/11/22 0217  NA 135 135 137 136  K 3.4* 3.2* 3.4* 3.2*  CL 93* 93* 94* 95*  CO2 28 28 24 28   GLUCOSE 163* 224* 127* 147*  BUN 22 38* 41* 14  CREATININE 4.81* 6.15* 7.58* 4.55*  CALCIUM 9.1 9.5 8.9 8.6*  PROT 6.0* 5.9*  --   --   ALBUMIN  2.4* 3.2* 2.3* 2.2*  AST 27 16  --   --   ALT 49* 36  --   --   ALKPHOS 80 75  --   --   BILITOT 0.7 0.4  --   --   GFRNONAA 9* 7* 5* 10*  ANIONGAP 14 14 19* 13    Hematology Recent Labs  Lab 06/09/22 2054 06/10/22 1007 06/10/22 2008 06/11/22 0217  WBC 9.7 10.9*  --  9.6  RBC 3.04* 2.82*  --  3.02*  HGB 8.3* 7.9* 9.0* 8.3*  HCT 25.9* 23.9* 28.4* 25.8*  MCV 85.2 84.8  --  85.4  MCH 27.3 28.0  --  27.5  MCHC 32.0 33.1  --  32.2  RDW 17.2* 17.2*  --  17.2*  PLT 496* 443*  --  450*   BNPNo results for input(s): "BNP", "PROBNP" in the last 168 hours.  DDimer No results for input(s): "DDIMER" in the last 168 hours.   Radiology  DG Chest Portable 1 View  Result Date: 06/09/2022 CLINICAL DATA:  Worsening shortness of breath. EXAM: PORTABLE CHEST 1 VIEW COMPARISON:  Radiograph yesterday.  CT 05/15/2022 FINDINGS: Cardiomegaly. Similar central vascular congestion allowing for differences in technique. No convincing pulmonary edema. Increasing atelectasis at the left lung base. No large pleural effusion or pneumothorax. Stable osseous structures. IMPRESSION: 1. Increasing atelectasis at the left lung base. 2. Cardiomegaly and central vascular congestion, unchanged. Electronically Signed   By: Keith Rake M.D.   On: 06/09/2022 21:02    Cardiac Studies  LHC 03/08/2022 1.  Successful atherectomy, PTCA, and stenting of severe stenosis in the mid LAD, reducing 90% calcific stenosis to 0%, TIMI-3 flow pre and post, with a 3.0 x 18 mm Onyx frontier DES 2.  Successful atherectomy, PTCA, and stenting of severe stenosis in the mid RCA, reducing 95% calcific stenosis to 0%, TIMI-3 flow pre and post, with a 3.0 x 34 mm Onyx frontier DES  Patient Profile  [name] is a 71 y.o. female with CAD, persistent atrial fibrillation, ESRD on hemodialysis, COPD, HFpEF, right heart failure who was admitted on 06/10/2022 with chest pain and recurrent anemia secondary to presumed GI bleed.  Assessment &  Plan   #Chest pain #Gastritis #GI bleed. -Recently admitted to the hospital last week with chest pain secondary to gastritis and bleeding  duodenal AVM.  Status post intervention.  Now back with recurrent symptoms and recurrent drop in hemoglobin. -Status post capsule endoscopy.  Would recommend to hold antiplatelet agents as well as Eliquis until deemed safe and appropriate by GI.  She is roughly 3 months since her stent procedure.  Okay to hold medications.  Also okay to hold anticoagulation in setting of GI bleed. -Her symptoms are constant.  Troponins are minimally elevated.  EKG nonischemic.  Everything looks reassuring from a cardiac standpoint.  #CAD status post PCI -Intervention on 03/08/2022 to the mid LAD and mid RCA.  Okay to hold Brilinta for now.  On Eliquis for A-fib as well.  Can hold this. -Restart home Crestor as you are able. -Symptoms of chest discomfort are likely related to gastritis or upper GI issue.  Do not appear to represent cardiac etiology.  #Permanent atrial fibrillation -Currently rate controlled.  Holding anticoagulation until GI bleed has resolved.  #HFpEF #Right heart failure -Volume status controlled by dialysis    For questions or updates, please contact [place] Please consult [email or web address] for contact info under   Signed, [place] Bells T. Audie Box, MD, [place]  06/11/2022 10:29 AM

## 2022-06-11 NOTE — Assessment & Plan Note (Signed)
Continue blood pressure control with atenolol. Ultrafiltration with renal replacement therapy.

## 2022-06-11 NOTE — Assessment & Plan Note (Signed)
Continue rate control atrial fibrillation Holding on anticoagulation due to acute blood loss anemia.  On atenolol and diltiazem. Continue telemetry monitoring.

## 2022-06-11 NOTE — Assessment & Plan Note (Signed)
Patient euvolemic, continue blood pressure monitoring and rate control atrial fibrillation.

## 2022-06-11 NOTE — Progress Notes (Signed)
Progress Note   Patient: [name] DOB: 01/03/1951 DOA: 06/09/2022     1 DOS: the patient was seen and examined on 06/11/2022   Brief hospital course: [name] was admitted to the hospital with the working diagnosis of acute blood loss anemia due to upper GI bleed.   71 yo female with the past medical history of heart failure, anemia, coronary artery diease, ESRD on HD, T2DM, hypertension and dyslipidemia, who presented with palpitations and weakness. Recent hospitalization for GI bleed 06/27 to 06/04/22, had 1 units PRBC transfused. 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 At home continue to have dark stool. Positive palpitations and dyspnea that prompted her to come back to the ED. On her initial physical examination her blood pressure was 99/61, 167/68, HR 80 to 103, RR 13 to 22. Lungs with no wheezing, heart with S1 and S2 present regular rhythm, abdomen not distended and no lower extremity edema.   Na 137, K 3,4 Cl 94 bicarbonate 24 glucose 127 bun 41 cr 7,58  High sensitive troponin 46  Wbc 10,9 hgb 7,9 plt 443   Chest radiograph with cardiomegaly and left lower lobe atelectasis.   EKG 93 bpm, left axis deviation, left anterior fascicular block, qtc 527,atrial fibrillation, no significant ST segment or T wave changes.    Assessment and Plan: Acute blood loss anemia Upper GI bleed. Patient continue to be very weak and deconditioned.  Follow up hgb is 8.3   Plan to continue GI work up with capsule endoscopy.  Acute on chronic anemia, symptomatic anemia. No current indication for PRBC transfusion.  Check iron panel in am.  Continue pantoprazole po bid.   ESRD (end stage renal disease) on dialysis (HCC) Hypokalemia.   Patient euvolemic.  Plan to continue renal replacement therapy per nephrology recommendations.  Add 20 kcl po x1 and follow up renal function and electrolytes in am.    Permanent atrial fibrillation (HCC) Continue rate  control atrial fibrillation Holding on anticoagulation due to acute blood loss anemia.  On atenolol and diltiazem. Continue telemetry monitoring.   CAD S/P percutaneous coronary angioplasty No chest pain, continue blood pressure control.   Heart failure with preserved ejection fraction ([place]) Patient euvolemic, continue blood pressure monitoring and rate control atrial fibrillation.   Type 2 diabetes mellitus with hyperlipidemia ([place]) Continue close glucose cover monitoring Her fasting glucose this am is 147  Capillary 223 and 231.   Plan to add insulin sliding scale for glucose cover and monitoring.  Continue basal insulin with 10 units glargine.   Continue with statin therapy.   Essential hypertension Continue blood pressure control with atenolol. Ultrafiltration with renal replacement therapy.   Anxiety Continue with ropinarole and quetiapine.         Subjective: Patient continue feeling weak and deconditioned, no chest pain or dyspnea.   Physical Exam: Vitals:   06/11/22 0515 06/11/22 0520 06/11/22 0800 06/11/22 1041  BP: (!) 113/99 115/71  123/72  Pulse: 94 95  80  Resp: 18   19  Temp: 98.1 F (36.7 C)   98 F (36.7 C)  TempSrc: Oral   Oral  SpO2: 100% 100%  96%  Weight:   76 kg   Height:   5\' 3"  (1.6 m)    Neurology awake and alert ENT with mild pallor Cardiovascular with S1 and S2 present and irregularly irregular  Respiratory with no rales or wheezing Abdomen not distended No lower extremity edema  Data Reviewed:  Family Communication: no family at the bedside   Disposition: Status is: Inpatient Remains inpatient appropriate because: GI workup   Planned Discharge Destination: Home   Author: Tawni Millers, MD 06/11/2022 3:05 PM  For on call review [email or web address].

## 2022-06-11 NOTE — Hospital Course (Signed)
[name] was admitted to the hospital with the working diagnosis of acute blood loss anemia due to upper GI bleed.   71 yo female with the past medical history of heart failure, anemia, coronary artery diease, ESRD on HD, T2DM, hypertension and dyslipidemia, who presented with palpitations and weakness. Recent hospitalization for GI bleed 06/27 to 06/04/22, had 1 units PRBC transfused. 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 At home continue to have dark stool. Positive palpitations and dyspnea that prompted her to come back to the ED. On her initial physical examination her blood pressure was 99/61, 167/68, HR 80 to 103, RR 13 to 22. Lungs with no wheezing, heart with S1 and S2 present regular rhythm, abdomen not distended and no lower extremity edema.   Na 137, K 3,4 Cl 94 bicarbonate 24 glucose 127 bun 41 cr 7,58  High sensitive troponin 46  Wbc 10,9 hgb 7,9 plt 443   Chest radiograph with cardiomegaly and left lower lobe atelectasis.   EKG 93 bpm, left axis deviation, left anterior fascicular block, qtc 527,atrial fibrillation, no significant ST segment or T wave changes.

## 2022-06-11 NOTE — Assessment & Plan Note (Addendum)
Upper GI bleed. Patient continue to be very weak and deconditioned.  Follow up hgb is 8.3   Plan to continue GI work up with capsule endoscopy.  Acute on chronic anemia, symptomatic anemia. No current indication for PRBC transfusion.  Check iron panel in am.  Continue pantoprazole po bid.

## 2022-06-11 NOTE — Progress Notes (Signed)
Capsule endoscopy placed this morning.  OK to have clear liquids now, NPO after midnight.  Will hopefully be able to download and review the capsule study in the morning with possibility of endoscopic/enteroscopic evaluation tomorrow pending capsule results and labs/clinical course.  [facility] will revisit tomorrow morning.

## 2022-06-12 ENCOUNTER — Telehealth: Payer: Medicare Other

## 2022-06-12 ENCOUNTER — Telehealth: Payer: Self-pay | Admitting: *Deleted

## 2022-06-12 DIAGNOSIS — I2510 Atherosclerotic heart disease of native coronary artery without angina pectoris: Secondary | ICD-10-CM | POA: Diagnosis not present

## 2022-06-12 DIAGNOSIS — I4821 Permanent atrial fibrillation: Secondary | ICD-10-CM | POA: Diagnosis not present

## 2022-06-12 DIAGNOSIS — D62 Acute posthemorrhagic anemia: Secondary | ICD-10-CM | POA: Diagnosis not present

## 2022-06-12 DIAGNOSIS — Z9861 Coronary angioplasty status: Secondary | ICD-10-CM | POA: Diagnosis not present

## 2022-06-12 LAB — GLUCOSE, CAPILLARY
Glucose-Capillary: 121 mg/dL — ABNORMAL HIGH (ref 70–99)
Glucose-Capillary: 116 mg/dL — ABNORMAL HIGH (ref 70–99)
Glucose-Capillary: 191 mg/dL — ABNORMAL HIGH (ref 70–99)
Glucose-Capillary: 307 mg/dL — ABNORMAL HIGH (ref 70–99)

## 2022-06-12 LAB — CBC
HCT: 25.7 % — ABNORMAL LOW (ref 36.0–46.0)
Hemoglobin: 8.1 g/dL — ABNORMAL LOW (ref 12.0–15.0)
MCH: 27.3 pg (ref 26.0–34.0)
MCHC: 31.5 g/dL (ref 30.0–36.0)
MCV: 86.5 fL (ref 80.0–100.0)
Platelets: 449 10*3/uL — ABNORMAL HIGH (ref 150–400)
RBC: 2.97 MIL/uL — ABNORMAL LOW (ref 3.87–5.11)
RDW: 17.6 % — ABNORMAL HIGH (ref 11.5–15.5)
WBC: 8.9 10*3/uL (ref 4.0–10.5)
nRBC: 0.7 % — ABNORMAL HIGH (ref 0.0–0.2)

## 2022-06-12 LAB — RENAL FUNCTION PANEL
Albumin: 2.2 g/dL — ABNORMAL LOW (ref 3.5–5.0)
Anion gap: 17 — ABNORMAL HIGH (ref 5–15)
BUN: 24 mg/dL — ABNORMAL HIGH (ref 8–23)
CO2: 27 mmol/L (ref 22–32)
Calcium: 9.5 mg/dL (ref 8.9–10.3)
Chloride: 92 mmol/L — ABNORMAL LOW (ref 98–111)
Creatinine, Ser: 6.73 mg/dL — ABNORMAL HIGH (ref 0.44–1.00)
GFR, Estimated: 6 mL/min — ABNORMAL LOW (ref >60)
Glucose, Bld: 134 mg/dL — ABNORMAL HIGH (ref 70–99)
Phosphorus: 3.7 mg/dL (ref 2.5–4.6)
Potassium: 3.6 mmol/L (ref 3.5–5.1)
Sodium: 136 mmol/L (ref 135–145)

## 2022-06-12 MED ORDER — LORAZEPAM 0.5 MG PO TABS
0.5000 mg | ORAL_TABLET | Freq: Four times a day (QID) | ORAL | Status: DC | PRN
  Administered 2022-06-12: 0.5 mg via ORAL
  Filled 2022-06-12: qty 1

## 2022-06-12 MED ORDER — LIDOCAINE HCL (PF) 1 % IJ SOLN
5.0000 mL | INTRAMUSCULAR | Status: DC | PRN
Start: 2022-06-12 — End: 2022-06-13

## 2022-06-12 MED ORDER — HEPARIN SODIUM (PORCINE) 1000 UNIT/ML DIALYSIS
1000.0000 [IU] | INTRAMUSCULAR | Status: DC | PRN
Start: 2022-06-12 — End: 2022-06-13

## 2022-06-12 MED ORDER — INSULIN ASPART 100 UNIT/ML IJ SOLN
0.0000 [IU] | Freq: Three times a day (TID) | INTRAMUSCULAR | Status: DC
  Administered 2022-06-13: 3 [IU] via SUBCUTANEOUS
  Administered 2022-06-13 (×2): 1 [IU] via SUBCUTANEOUS
  Administered 2022-06-14: 3 [IU] via SUBCUTANEOUS
  Administered 2022-06-14: 1 [IU] via SUBCUTANEOUS
  Administered 2022-06-14 – 2022-06-15 (×2): 2 [IU] via SUBCUTANEOUS

## 2022-06-12 MED ORDER — PENTAFLUOROPROP-TETRAFLUOROETH EX AERO: 1.0000 | INHALATION_SPRAY | CUTANEOUS | Status: DC | PRN

## 2022-06-12 MED ORDER — CHLORHEXIDINE GLUCONATE CLOTH 2 % EX PADS
6.0000 | MEDICATED_PAD | Freq: Every day | CUTANEOUS | Status: DC
  Administered 2022-06-14: 6 via TOPICAL

## 2022-06-12 MED ORDER — ALTEPLASE 2 MG IJ SOLR: 2.0000 mg | Freq: Once | INTRAMUSCULAR | Status: DC | PRN

## 2022-06-12 MED ORDER — INSULIN ASPART 100 UNIT/ML IJ SOLN
0.0000 [IU] | Freq: Every day | INTRAMUSCULAR | Status: DC
  Administered 2022-06-12: 4 [IU] via SUBCUTANEOUS
  Administered 2022-06-14: 2 [IU] via SUBCUTANEOUS

## 2022-06-12 NOTE — Progress Notes (Addendum)
Progress Note  Patient Name: [name] Date of Encounter: 06/12/2022  [facility] Cardiologist: Minus Breeding, MD   Subjective   No cardiac complaints waiting on capsule results   Inpatient Medications    Scheduled Meds:  atenolol  100 mg Oral QHS   calcium acetate  1,334 mg Oral TID with meals   calcium acetate  667 mg Oral With snacks   diltiazem  240 mg Oral Daily   [START ON 06/13/2022] gabapentin  800 mg Oral Q T,Th,Sa-HD   insulin aspart  0-9 Units Subcutaneous TID WC   insulin glargine-yfgn  10 Units Subcutaneous QHS   mometasone-formoterol  2 puff Inhalation BID   multivitamin  1 tablet Oral Daily   pantoprazole  40 mg Oral BID   QUEtiapine  200 mg Oral QHS   rOPINIRole  1 mg Oral QHS   rosuvastatin  5 mg Oral QHS   sodium chloride flush  3 mL Intravenous Q12H   Continuous Infusions:  PRN Meds: acetaminophen **OR** acetaminophen, albuterol, carboxymethylcellul-glycerin, hydrOXYzine, lidocaine-prilocaine, loratadine   Vital Signs    Vitals:   06/11/22 2006 06/12/22 0457 06/12/22 0751 06/12/22 0756  BP: 124/61 115/62  132/69  Pulse: 85 90 88   Resp: 18 20 17    Temp: 98 F (36.7 C) 97.9 F (36.6 C)  98.2 F (36.8 C)  TempSrc: Oral Oral  Oral  SpO2: 100% 100% 100%   Weight:  76.6 kg    Height:        Intake/Output Summary (Last 24 hours) at 06/12/2022 0845 Last data filed at 06/11/2022 2045 Gross per 24 hour  Intake 240 ml  Output --  Net 240 ml      06/12/2022    4:57 AM 06/11/2022    8:00 AM 06/10/2022    6:55 PM  Last 3 Weights  Weight (lbs) 168 lb 14 oz 167 lb 8.8 oz 167 lb 8.8 oz  Weight (kg) 76.6 kg 76 kg 76 kg      Telemetry    Atrial fibrillation with rate in 90-100s- Personally Reviewed  ECG    N/a  Physical Exam   GEN: No acute distress.   Neck: No JVD Cardiac: IR IR , no murmurs, rubs, or gallops.  Respiratory: Clear to auscultation bilaterally. GI: Soft, nontender, non-distended  MS: No edema; No deformity. Neuro:   Nonfocal  Psych: Normal affect   Labs    High Sensitivity Troponin:   Recent Labs  Lab 05/30/22 1803 05/30/22 1927 06/08/22 2245 06/09/22 2219 06/10/22 0454  TROPONINIHS 89* 92* 53* 33* 46*     Chemistry Recent Labs  Lab 06/08/22 2245 06/09/22 2054 06/10/22 1007 06/11/22 0217 06/12/22 0346  NA 135 135 137 136 136  K 3.4* 3.2* 3.4* 3.2* 3.6  CL 93* 93* 94* 95* 92*  CO2 28 28 24 28 27   GLUCOSE 163* 224* 127* 147* 134*  BUN 22 38* 41* 14 24*  CREATININE 4.81* 6.15* 7.58* 4.55* 6.73*  CALCIUM 9.1 9.5 8.9 8.6* 9.5  MG  --   --  2.0  --   --   PROT 6.0* 5.9*  --   --   --   ALBUMIN 2.4* 3.2* 2.3* 2.2* 2.2*  AST 27 16  --   --   --   ALT 49* 36  --   --   --   ALKPHOS 80 75  --   --   --   BILITOT 0.7 0.4  --   --   --  GFRNONAA 9* 7* 5* 10* 6*  ANIONGAP 14 14 19* 13 17*    Lipids No results for input(s): "CHOL", "TRIG", "HDL", "LABVLDL", "LDLCALC", "CHOLHDL" in the last 168 hours.  Hematology Recent Labs  Lab 06/10/22 1007 06/10/22 2008 06/11/22 0217 06/12/22 0346  WBC 10.9*  --  9.6 8.9  RBC 2.82*  --  3.02* 2.97*  HGB 7.9* 9.0* 8.3* 8.1*  HCT 23.9* 28.4* 25.8* 25.7*  MCV 84.8  --  85.4 86.5  MCH 28.0  --  27.5 27.3  MCHC 33.1  --  32.2 31.5  RDW 17.2*  --  17.2* 17.6*  PLT 443*  --  450* 449*   Thyroid No results for input(s): "TSH", "FREET4" in the last 168 hours.  BNPNo results for input(s): "BNP", "PROBNP" in the last 168 hours.  DDimer No results for input(s): "DDIMER" in the last 168 hours.   Radiology    No results found.  Cardiac Studies   INTRAVASCULAR IMAGING/OCT  03/08/2022  CORONARY ATHERECTOMY   Conclusion  1.  Successful atherectomy, PTCA, and stenting of severe stenosis in the mid LAD, reducing 90% calcific stenosis to 0%, TIMI-3 flow pre and post, with a 3.0 x 18 mm Onyx frontier DES 2.  Successful atherectomy, PTCA, and stenting of severe stenosis in the mid RCA, reducing 95% calcific stenosis to 0%, TIMI-3 flow pre and post, with  a 3.0 x 34 mm Onyx frontier DES   Recommendations: Patient with atrial fibrillation, significant comorbidities of end-stage renal disease.  Considering balance of antithrombotic efficacy and concern over bleeding risk with multidrug therapy, favor the following approach: Resume apixaban tomorrow at normal dosing schedule, no heparin overnight Ticagrelor 90 mg twice daily x3 months Aspirin 81 mg daily long-term after 3 months of ticagrelor completed  Patient Profile     71 y.o. female with CAD, persistent atrial fibrillation, ESRD on hemodialysis, COPD, HFpEF, right heart failure who was admitted on 06/10/2022 with chest pain and recurrent anemia secondary to presumed GI bleed.  Recently admitted for for chest pain secondary to gastritis and bleeding duodenal AVM.  S/p intervention.  Now back with recurrent symptoms and drop in hemoglobin.  Assessment & Plan    Chest pain with history of CAD Acute blood loss anemia Gastritis -Felt safe to hold Brilinta as she is out of 3 months since her PCI.  Also holding Eliquis for GI bleed.  As recommended in Cath report, plans to start aspirin after 3 months of ticagrelor completion.  Start aspirin when safe on GI standpoint. -EKG nonischemic.  Troponin mildly elevated.  Not consistent with ACS. -Pending results of capsule endoscopy -Symptoms felt more due to GI issues greater than cardiac etiology -Continue atenolol and Crestor -Transfusion per primary team (hemoglobin 8.1 today)  Permanent atrial fibrillation Continue beta-blocker and Cardizem Anti-coagulation on hold until resolved GI issue  HFrEF/right heart failure/ESRD Volume managed by dialysis   For questions or updates, please contact [facility] Please consult [email or web address] for contact info under        Signed, Leanor Kail, PA  06/12/2022, 8:45 AM     Patient examined chart reviewed waiting on capsule results No angina exam with chronically ill black female Fistula in  RUE large with thrill lungs clear SEM no edema. Afib rates in 80-90 resume eliquis when ok with GI CAD no angina resume ASA when ok with GI completed 3 months of Brilinta   Jenkins Rouge MD [facility]

## 2022-06-12 NOTE — Progress Notes (Signed)
Pt receives out-pt HD at [facility] on TTS. Pt has a 7:00 chair time. Will assist as needed.   Melven Sartorius Renal Navigator [phone]

## 2022-06-12 NOTE — Progress Notes (Signed)
  Progress Note   Patient: [name] ZGY:[id number] DOB: 05-22-1951 DOA: 06/09/2022     2 DOS: the patient was seen and examined on 06/12/2022   Brief hospital course: [name] was admitted to the hospital with the working diagnosis of acute blood loss anemia due to upper GI bleed.    71 yo female with the past medical history of heart failure, anemia, coronary artery diease, ESRD on HD, T2DM, hypertension and dyslipidemia, who presented with palpitations and weakness. Recent hospitalization for GI bleed 06/27 to 06/04/22, had 1 units PRBC transfused. 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 At home continue to have dark stool. Positive palpitations and dyspnea that prompted her to come back to the ED. On her initial physical examination her blood pressure was 99/61, 167/68, HR 80 to 103, RR 13 to 22. Lungs with no wheezing, heart with S1 and S2 present regular rhythm, abdomen not distended and no lower extremity edema.   Assessment and Plan: Acute blood loss anemia Hgb stable in low 8s. No BM for several days. Capsule endoscopy negative. GI OK with resuming antiplatelet/anticoagulatin tomorrow  Debility Patient lives alone. She complains of feeling very weak - PT consulted  ESRD (end stage renal disease) on dialysis ([place]) On tts hemodialysis, nephrology following. Appears euvolemic today  Permanent atrial fibrillation (HCC) Continue rate control atrial fibrillation Holding on anticoagulation due to acute blood loss anemia.  On atenolol and diltiazem. Discussed w/ cardiology and GI today. Plan tomorrow is re-start eliquis and substitute aspirin for home brilinta. Will hold on placing those orders now, observe overnight, and assuming no bleeding overnight and hgb stable would start those meds tomorrow  CAD S/P percutaneous coronary angioplasty No chest pain, continue blood pressure control.   Heart failure with preserved ejection fraction ([place]) Patient  euvolemic, continue blood pressure monitoring and rate control atrial fibrillation.   Type 2 diabetes mellitus with hyperlipidemia (HCC) euglycemic Continue basal/bolus insulin therapy  Essential hypertension Continue blood pressure control with atenolol. Ultrafiltration with renal replacement therapy.   Anxiety Continue with ropinarole and quetiapine.    Subjective: Patient continue feeling weak and deconditioned, no chest pain or dyspnea. No vomiting, no bm  Physical Exam: Vitals:   06/12/22 0457 06/12/22 0751 06/12/22 0756 06/12/22 1129  BP: 115/62  132/69 (!) 112/49  Pulse: 90 88    Resp: 20 17    Temp: 97.9 F (36.6 C)  98.2 F (36.8 C) 98.1 F (36.7 C)  TempSrc: Oral  Oral Oral  SpO2: 100% 100%  100%  Weight: 76.6 kg     Height:       Neurology awake and alert ENT with mild pallor Cardiovascular with S1 and S2 present and irregularly irregular  Respiratory with no rales or wheezing Abdomen not distended No lower extremity edema  Data Reviewed:    Family Communication: no family at the bedside   Disposition: Status is: Inpatient Remains inpatient appropriate because: pending PT evaluation, inpatient monitoring  Planned Discharge Destination: Home pending PT eval  Author: Desma Maxim, MD 06/12/2022 2:48 PM  For on call review [email or web address].

## 2022-06-12 NOTE — Progress Notes (Signed)
[name] 12:52 PM  Subjective: Patient seen and examined and discussed with Dr. Paulita Fujita and she has not had a bowel movement since she has been here and her previous work-up was reviewed and her capsule endoscopy was reviewed please see report for details but no signs of bleeding  Objective: Vital signs stable afebrile no acute distress abdomen is soft nontender BUN and creatinine increased due for dialysis tomorrow hemoglobin stable white count and platelets okay  Assessment: GI blood loss and patient on multiple blood thinner  Plan: Since capsule endoscopy was negative no further work-up at this time recommend more frequent MiraLAX use at home and we will see how she does on less blood thinners and then decide further work-up and plans pending ongoing bleeding and please call us this week if we could be of any further assistance with this hospital stay  [facility]  office [phone] After 5PM or if no answer call [phone]

## 2022-06-12 NOTE — Progress Notes (Signed)
[facility] Progress Note   Subjective:    Seen and examined patient at bedside. No acute complaints. S/p capsule endoscopy yesterday-no signs for bleeding. Denies SOB, CP, and N/V. Plan for HD 7/11.  Objective Vitals:   06/12/22 0457 06/12/22 0751 06/12/22 0756 06/12/22 1129  BP: 115/62  132/69 (!) 112/49  Pulse: 90 88    Resp: 20 17    Temp: 97.9 F (36.6 C)  98.2 F (36.8 C) 98.1 F (36.7 C)  TempSrc: Oral  Oral Oral  SpO2: 100% 100%  100%  Weight: 76.6 kg     Height:       Physical Exam General: Well-appearing; NAD Heart: S1 and S2; No murmurs, gallops, or rubs Lungs: Clear throughout; No wheezing, rales, or rhonchi Abdomen: Soft and non-tender Extremities: No edema BLLE Dialysis Access:  R AVF (+) B/T   Filed Weights   06/10/22 1855 06/11/22 0800 06/12/22 0457  Weight: 76 kg 76 kg 76.6 kg    Intake/Output Summary (Last 24 hours) at 06/12/2022 1439 Last data filed at 06/12/2022 1401 Gross per 24 hour  Intake 360 ml  Output 150 ml  Net 210 ml    Additional Objective Labs: Basic Metabolic Panel: Recent Labs  Lab 06/10/22 1007 06/11/22 0217 06/12/22 0346  NA 137 136 136  K 3.4* 3.2* 3.6  CL 94* 95* 92*  CO2 24 28 27   GLUCOSE 127* 147* 134*  BUN 41* 14 24*  CREATININE 7.58* 4.55* 6.73*  CALCIUM 8.9 8.6* 9.5  PHOS 4.8* 3.4 3.7   Liver Function Tests: Recent Labs  Lab 06/08/22 2245 06/09/22 2054 06/10/22 1007 06/11/22 0217 06/12/22 0346  AST 27 16  --   --   --   ALT 49* 36  --   --   --   ALKPHOS 80 75  --   --   --   BILITOT 0.7 0.4  --   --   --   PROT 6.0* 5.9*  --   --   --   ALBUMIN 2.4* 3.2* 2.3* 2.2* 2.2*   No results for input(s): "LIPASE", "AMYLASE" in the last 168 hours. CBC: Recent Labs  Lab 06/08/22 2245 06/09/22 2054 06/10/22 1007 06/10/22 2008 06/11/22 0217 06/12/22 0346  WBC 11.0* 9.7 10.9*  --  9.6 8.9  NEUTROABS 7.9* 7.1  --   --   --   --   HGB 9.5* 8.3* 7.9* 9.0* 8.3* 8.1*  HCT 30.0* 25.9* 23.9*  28.4* 25.8* 25.7*  MCV 86.0 85.2 84.8  --  85.4 86.5  PLT 479* 496* 443*  --  450* 449*   Blood Culture    Component Value Date/Time   SDES  04/25/2022 1844    URINE, CLEAN CATCH Performed at [facility], [address], [place], [place] [address]    [facility]  04/25/2022 1844    NONE Performed at [facility], [address] [place], [place] [address]    CULT (A) 04/25/2022 1844    <10,000 COLONIES/mL INSIGNIFICANT GROWTH Performed at [facility] [address] [place],  [address]    REPTSTATUS 04/27/2022 FINAL 04/25/2022 1844    Cardiac Enzymes: No results for input(s): "CKTOTAL", "CKMB", "CKMBINDEX", "TROPONINI" in the last 168 hours. CBG: Recent Labs  Lab 06/10/22 2159 06/11/22 1612 06/11/22 2103 06/12/22 0632 06/12/22 1123  GLUCAP 223* 290* 144* 121* 116*   Iron Studies: No results for input(s): "IRON", "TIBC", "TRANSFERRIN", "FERRITIN" in the last 72 hours. Lab Results  Component Value  Date   INR 1.9 (H) 03/26/2022   INR 1.0 06/17/2008   Studies/Results: No results found.  Medications:   atenolol  100 mg Oral QHS   calcium acetate  1,334 mg Oral TID with meals   calcium acetate  667 mg Oral With snacks   diltiazem  240 mg Oral Daily   [START ON 06/13/2022] gabapentin  800 mg Oral Q T,Th,Sa-HD   insulin aspart  0-9 Units Subcutaneous TID WC   insulin glargine-yfgn  10 Units Subcutaneous QHS   mometasone-formoterol  2 puff Inhalation BID   multivitamin  1 tablet Oral Daily   pantoprazole  40 mg Oral BID   QUEtiapine  200 mg Oral QHS   rOPINIRole  1 mg Oral QHS   rosuvastatin  5 mg Oral QHS   sodium chloride flush  3 mL Intravenous Q12H    Dialysis Orders: Unit: [facility] TTS Schedule/Time: 4:00  EDW: 78.5 kg Flows: 350/500 Bath: 2K/2.5Ca Access: AVF Heparin: 1000 bolus then 1000/hr ESA: Mircera 200 q 2 weeks (last dosed on 7/4) VDRA: None  Assessment/Plan: Melena/Recurrent GIB. S/p EGD with AVMs on last admission. GI  consulted.  S/p capsule endoscopy yesterday-no signs for bleeding. Per GI, recommend lower dose blood thinners, more frequent miralax, and further w/u if bleeding persist. ESRD -   HD TTS.  Continue on schedule --Hold heparin. Plan for HD 7/11. Hypokalemia - K+ 3.6, continue 3K bath for now.  Hypertension/volume  - BP ok. Some volume on exam. Challenge EDW today as tolerated.  Anemia  -Hgb now 8.1 s/p transfusion last admission. Follow trends. Received ESA at OP unit on 7/4.  Metabolic bone disease -  Ca/Phos ok. Continue home meds Permanent AFib - Rate controlled. Eliquis held. States OP cardiology was planning ablation.  Nutrition - Renal diet with fluid restriction   Tobie Poet, NP [place] 06/12/2022,2:39 PM  LOS: 2 days

## 2022-06-12 NOTE — Progress Notes (Signed)
PROCEDURE NOTE  06/09/2022 - 06/11/2022  12:50 PM  PATIENT:  [name]  71 y.o. female  PRE-OPERATIVE DIAGNOSIS: GI blood loss  POST-OPERATIVE DIAGNOSIS: No signs of bleeding  PROCEDURE: Capsule endoscopy  SURGEON:  Surgeon(s): Clarene Essex, MD  ASSESSMENT/FINDINGS: The pill was stuck in the esophagus for a longer time than usual and there was a red pill that dissolves while the capsule was in the stomach otherwise no abnormalities and no signs of bleeding and exam to the colon and please see capsule endoscopy report for details  PLAN OF CARE: Agree with decreasing her blood thinners no further GI work-up at this time and we discussed using MiraLAX more frequently at home

## 2022-06-12 NOTE — Telephone Encounter (Signed)
  Care Management   Follow Up Note   06/12/2022 Name: [name] MRN: [id number] DOB: March 31, 1951   Referred by: [name], MD Reason for referral : Chronic Care Management (Unsuccessful telephone follow-up)   Patient was scheduled for a telephone follow-up to discuss symptomatic Afib and cardiology recommendations from last week. Chart review shows that cardiology did reach out to patient on Friday and patient presented to ED and was admitted to [facility] on 06/09/22. She is still inpatient.   Follow Up Plan:  Forwarding to [place] for outreach and rescheduling once discharged from hospital.   Chong Sicilian, BSN, RN-BC [place] [facility] [place] [facility] Management Direct Dial: [phone]

## 2022-06-12 NOTE — Progress Notes (Signed)
Mobility Specialist Progress Note:   06/12/22 0953  Mobility  Activity Ambulated with assistance in room  Level of Assistance Contact guard assist, steadying assist  Assistive Device Front wheel walker  Distance Ambulated (ft) 30 ft  Activity Response Tolerated well  $Mobility charge 1 Mobility   Pt received in bed willing to particiapte in mobility. Complaints of legs feeling like they are "on fire". Left in chair with call bell in reach and all needs met.   [facility] Mobility Specialist

## 2022-06-12 NOTE — Consult Note (Signed)
   [facility] Inpatient Consult   06/12/2022  [name] Dec 15, 1950 [id number]  [facility] [employer] [ACO] Patient: Medicare ACO REACH  Active with CCM *Patient is extreme high risk for unplanned readmission and has readmitted with less than 7 days  Primary Care Provider:  [name], MD, [place], is an embedded provider with a Chronic Care Management team and program, and is listed for the transition of care follow up and appointments.  Patient has been active chronic care management team member. Patient discussed in unit progression meeting.  Plan:  Noted that CCM RNCM is aware of hospitalization with the [facility] Management.  Continue to follow for needs.  Please contact for further questions,  Natividad Brood, RN BSN [facility] Liaison  [phone] business mobile phone Toll free office [phone]  Fax number: [phone] [email or web address]

## 2022-06-13 DIAGNOSIS — I5030 Unspecified diastolic (congestive) heart failure: Secondary | ICD-10-CM | POA: Diagnosis not present

## 2022-06-13 DIAGNOSIS — Z9861 Coronary angioplasty status: Secondary | ICD-10-CM | POA: Diagnosis not present

## 2022-06-13 DIAGNOSIS — I2510 Atherosclerotic heart disease of native coronary artery without angina pectoris: Secondary | ICD-10-CM | POA: Diagnosis not present

## 2022-06-13 DIAGNOSIS — I4821 Permanent atrial fibrillation: Secondary | ICD-10-CM | POA: Diagnosis not present

## 2022-06-13 LAB — BLOOD GAS, ARTERIAL
Acid-Base Excess: 9.2 mmol/L — ABNORMAL HIGH (ref 0.0–2.0)
Bicarbonate: 33.5 mmol/L — ABNORMAL HIGH (ref 20.0–28.0)
O2 Saturation: 99.1 %
Patient temperature: 36.4
pCO2 arterial: 42 mmHg (ref 32–48)
pH, Arterial: 7.51 — ABNORMAL HIGH (ref 7.35–7.45)
pO2, Arterial: 108 mmHg (ref 83–108)

## 2022-06-13 LAB — RENAL FUNCTION PANEL
Albumin: 2.3 g/dL — ABNORMAL LOW (ref 3.5–5.0)
Anion gap: 15 (ref 5–15)
BUN: 34 mg/dL — ABNORMAL HIGH (ref 8–23)
CO2: 25 mmol/L (ref 22–32)
Calcium: 9.1 mg/dL (ref 8.9–10.3)
Chloride: 91 mmol/L — ABNORMAL LOW (ref 98–111)
Creatinine, Ser: 8.31 mg/dL — ABNORMAL HIGH (ref 0.44–1.00)
GFR, Estimated: 5 mL/min — ABNORMAL LOW (ref >60)
Glucose, Bld: 148 mg/dL — ABNORMAL HIGH (ref 70–99)
Phosphorus: 3.8 mg/dL (ref 2.5–4.6)
Potassium: 4 mmol/L (ref 3.5–5.1)
Sodium: 131 mmol/L — ABNORMAL LOW (ref 135–145)

## 2022-06-13 LAB — GLUCOSE, CAPILLARY
Glucose-Capillary: 161 mg/dL — ABNORMAL HIGH (ref 70–99)
Glucose-Capillary: 169 mg/dL — ABNORMAL HIGH (ref 70–99)
Glucose-Capillary: 280 mg/dL — ABNORMAL HIGH (ref 70–99)
Glucose-Capillary: 157 mg/dL — ABNORMAL HIGH (ref 70–99)

## 2022-06-13 LAB — CBC
HCT: 23.9 % — ABNORMAL LOW (ref 36.0–46.0)
Hemoglobin: 7.7 g/dL — ABNORMAL LOW (ref 12.0–15.0)
MCH: 27.2 pg (ref 26.0–34.0)
MCHC: 32.2 g/dL (ref 30.0–36.0)
MCV: 84.5 fL (ref 80.0–100.0)
Platelets: 424 10*3/uL — ABNORMAL HIGH (ref 150–400)
RBC: 2.83 MIL/uL — ABNORMAL LOW (ref 3.87–5.11)
RDW: 17.7 % — ABNORMAL HIGH (ref 11.5–15.5)
WBC: 9.7 10*3/uL (ref 4.0–10.5)
nRBC: 0.4 % — ABNORMAL HIGH (ref 0.0–0.2)

## 2022-06-13 LAB — HEPATITIS B SURFACE ANTIBODY, QUANTITATIVE: Hep B S AB Quant (Post): 312.7 m[IU]/mL (ref >9.9)

## 2022-06-13 MED ORDER — ANTICOAGULANT SODIUM CITRATE 4% (200MG/5ML) IV SOLN
5.0000 mL | Status: DC | PRN
Start: 2022-06-13 — End: 2022-06-13

## 2022-06-13 MED ORDER — POLYETHYLENE GLYCOL 3350 17 G PO PACK: 17.0000 g | PACK | Freq: Every day | ORAL | Status: DC | PRN

## 2022-06-13 MED ORDER — ALTEPLASE 2 MG IJ SOLR: 2.0000 mg | Freq: Once | INTRAMUSCULAR | Status: DC | PRN

## 2022-06-13 MED ORDER — HEPARIN SODIUM (PORCINE) 1000 UNIT/ML DIALYSIS: 1000.0000 [IU] | INTRAMUSCULAR | Status: DC | PRN

## 2022-06-13 MED ORDER — APIXABAN 5 MG PO TABS
5.0000 mg | ORAL_TABLET | Freq: Two times a day (BID) | ORAL | Status: DC
  Administered 2022-06-13 – 2022-06-14 (×3): 5 mg via ORAL
  Filled 2022-06-13 (×5): qty 1

## 2022-06-13 MED ORDER — ASPIRIN 81 MG PO TBEC
81.0000 mg | DELAYED_RELEASE_TABLET | Freq: Every day | ORAL | Status: DC
  Administered 2022-06-13 – 2022-06-15 (×3): 81 mg via ORAL
  Filled 2022-06-13 (×3): qty 1

## 2022-06-13 NOTE — Progress Notes (Signed)
Progress Note  Patient Name: [name] Date of Encounter: 06/13/2022  [facility] Cardiologist: Minus Breeding, MD   Subjective   No cardiac complaints  seen in dialysis   Inpatient Medications    Scheduled Meds:  atenolol  100 mg Oral QHS   calcium acetate  1,334 mg Oral TID with meals   calcium acetate  667 mg Oral With snacks   Chlorhexidine Gluconate Cloth  6 each Topical Q0600   diltiazem  240 mg Oral Daily   gabapentin  800 mg Oral Q T,Th,Sa-HD   insulin aspart  0-5 Units Subcutaneous QHS   insulin aspart  0-6 Units Subcutaneous TID WC   insulin glargine-yfgn  10 Units Subcutaneous QHS   mometasone-formoterol  2 puff Inhalation BID   multivitamin  1 tablet Oral Daily   pantoprazole  40 mg Oral BID   QUEtiapine  200 mg Oral QHS   rOPINIRole  1 mg Oral QHS   rosuvastatin  5 mg Oral QHS   sodium chloride flush  3 mL Intravenous Q12H   Continuous Infusions:  anticoagulant sodium citrate     PRN Meds: acetaminophen **OR** acetaminophen, albuterol, alteplase, alteplase, anticoagulant sodium citrate, carboxymethylcellul-glycerin, heparin, heparin, hydrOXYzine, lidocaine (PF), lidocaine-prilocaine, loratadine, LORazepam, pentafluoroprop-tetrafluoroeth   Vital Signs    Vitals:   06/13/22 0039 06/13/22 0354 06/13/22 0802 06/13/22 0830  BP:  (!) 122/55 (!) 133/58 (!) 119/49  Pulse:   78 85  Resp:  16 15 (!) 21  Temp:  98.2 F (36.8 C) 98 F (36.7 C)   TempSrc:  Oral Oral   SpO2:  100% 98% 100%  Weight: 77.4 kg     Height:        Intake/Output Summary (Last 24 hours) at 06/13/2022 0857 Last data filed at 06/13/2022 0356 Gross per 24 hour  Intake 480 ml  Output 150 ml  Net 330 ml      06/13/2022   12:39 AM 06/12/2022    4:57 AM 06/11/2022    8:00 AM  Last 3 Weights  Weight (lbs) 170 lb 10.2 oz 168 lb 14 oz 167 lb 8.8 oz  Weight (kg) 77.4 kg 76.6 kg 76 kg      Telemetry    Atrial fibrillation with rate in 90-100s- Personally Reviewed  ECG     N/a  Physical Exam   GEN: No acute distress.   Neck: No JVD Cardiac: IR IR , no murmurs, rubs, or gallops.  Respiratory: Clear to auscultation bilaterally. GI: Soft, nontender, non-distended  MS: No edema; No deformity. Neuro:  Nonfocal  Psych: Normal affect   Labs    High Sensitivity Troponin:   Recent Labs  Lab 05/30/22 1803 05/30/22 1927 06/08/22 2245 06/09/22 2219 06/10/22 0454  TROPONINIHS 89* 92* 53* 33* 46*     Chemistry Recent Labs  Lab 06/08/22 2245 06/09/22 2054 06/10/22 1007 06/11/22 0217 06/12/22 0346 06/13/22 0451  NA 135 135 137 136 136 131*  K 3.4* 3.2* 3.4* 3.2* 3.6 4.0  CL 93* 93* 94* 95* 92* 91*  CO2 28 28 24 28 27 25   GLUCOSE 163* 224* 127* 147* 134* 148*  BUN 22 38* 41* 14 24* 34*  CREATININE 4.81* 6.15* 7.58* 4.55* 6.73* 8.31*  CALCIUM 9.1 9.5 8.9 8.6* 9.5 9.1  MG  --   --  2.0  --   --   --   PROT 6.0* 5.9*  --   --   --   --   ALBUMIN 2.4* 3.2*  2.3* 2.2* 2.2* 2.3*  AST 27 16  --   --   --   --   ALT 49* 36  --   --   --   --   ALKPHOS 80 75  --   --   --   --   BILITOT 0.7 0.4  --   --   --   --   GFRNONAA 9* 7* 5* 10* 6* 5*  ANIONGAP 14 14 19* 13 17* 15    Lipids No results for input(s): "CHOL", "TRIG", "HDL", "LABVLDL", "LDLCALC", "CHOLHDL" in the last 168 hours.  Hematology Recent Labs  Lab 06/11/22 0217 06/12/22 0346 06/13/22 0451  WBC 9.6 8.9 9.7  RBC 3.02* 2.97* 2.83*  HGB 8.3* 8.1* 7.7*  HCT 25.8* 25.7* 23.9*  MCV 85.4 86.5 84.5  MCH 27.5 27.3 27.2  MCHC 32.2 31.5 32.2  RDW 17.2* 17.6* 17.7*  PLT 450* 449* 424*   Thyroid No results for input(s): "TSH", "FREET4" in the last 168 hours.  BNPNo results for input(s): "BNP", "PROBNP" in the last 168 hours.  DDimer No results for input(s): "DDIMER" in the last 168 hours.   Radiology    No results found.  Cardiac Studies   INTRAVASCULAR IMAGING/OCT  03/08/2022  CORONARY ATHERECTOMY   Conclusion  1.  Successful atherectomy, PTCA, and stenting of severe stenosis  in the mid LAD, reducing 90% calcific stenosis to 0%, TIMI-3 flow pre and post, with a 3.0 x 18 mm Onyx frontier DES 2.  Successful atherectomy, PTCA, and stenting of severe stenosis in the mid RCA, reducing 95% calcific stenosis to 0%, TIMI-3 flow pre and post, with a 3.0 x 34 mm Onyx frontier DES   Recommendations: Patient with atrial fibrillation, significant comorbidities of end-stage renal disease.  Considering balance of antithrombotic efficacy and concern over bleeding risk with multidrug therapy, favor the following approach: Resume apixaban tomorrow at normal dosing schedule, no heparin overnight Ticagrelor 90 mg twice daily x3 months Aspirin 81 mg daily long-term after 3 months of ticagrelor completed  Patient Profile     71 y.o. female with CAD, persistent atrial fibrillation, ESRD on hemodialysis, COPD, HFpEF, right heart failure who was admitted on 06/10/2022 with chest pain and recurrent anemia secondary to presumed GI bleed.  Recently admitted for for chest pain secondary to gastritis and bleeding duodenal AVM.  S/p intervention.  Now back with recurrent symptoms and drop in hemoglobin.  Assessment & Plan    Chest pain with history of CAD Acute blood loss anemia Gastritis -Felt safe to hold Brilinta as she is out of 3 months since her PCI.  Also holding Eliquis for GI bleed.  As recommended in Cath report, plans to start aspirin after 3 months of ticagrelor completion.    -EKG nonischemic.  Troponin mildly elevated.  Not consistent with ACS. -capsule endoscopy negative  -Symptoms felt more due to GI issues greater than cardiac etiology -Continue atenolol and Crestor -Transfusion per primary team Hct 23.9 this am  - Resume 81 mg ASA today discussed with GI   Permanent atrial fibrillation Continue beta-blocker and Cardizem Resume eliquis today discussed with Dr Watt Climes   HFrEF/right heart failure/ESRD Volume managed by dialysis   For questions or updates, please contact  [facility] Please consult [email or web address] for contact info under        Signed, Jenkins Rouge, MD  06/13/2022, 8:57 AM

## 2022-06-13 NOTE — TOC Progression Note (Signed)
Transition of Care [facility]) - Progression Note    Patient Details  Name: [name] MRN: [id number] Date of Birth: [birth year]  Transition of Care [facility]) CM/SW Contact  Zenon Mayo, RN Phone Number: 06/13/2022, 4:28 PM  Clinical Narrative:    GI bleed; on eloquix, holding brilenta. Hg7.7 this am, may transfuse. OPHD TTS. Walks pretty good From home, lives with mother, generalized weakness, [facility] recently involved.  TOC following.        Expected Discharge Plan and Services                                                 Social Determinants of Health (SDOH) Interventions    Readmission Risk Interventions    05/31/2022    8:33 AM 04/28/2022    2:04 PM 04/26/2022    8:27 AM  Readmission Risk Prevention Plan  Transportation Screening Complete Complete Complete  Medication Review ([place]) Complete Complete Complete  PCP or Specialist appointment within 3-5 days of discharge  Complete   HRI or [place] Complete Complete Complete  SW Recovery Care/Counseling Consult   Complete  Palliative Care Screening Not Applicable  Not [place] Not Applicable  Not Applicable

## 2022-06-13 NOTE — Plan of Care (Signed)
  Problem: Education: Goal: Knowledge of General Education information will improve Description: Including pain rating scale, medication(s)/side effects and non-pharmacologic comfort measures Outcome: Progressing   Problem: Health Behavior/Discharge Planning: Goal: Ability to manage health-related needs will improve Outcome: Progressing   Problem: Clinical Measurements: Goal: Will remain free from infection Outcome: Progressing Goal: Diagnostic test results will improve Outcome: Progressing Goal: Respiratory complications will improve Outcome: Progressing Goal: Cardiovascular complication will be avoided Outcome: Progressing   Problem: Activity: Goal: Risk for activity intolerance will decrease Outcome: Progressing   Problem: Nutrition: Goal: Adequate nutrition will be maintained Outcome: Progressing   Problem: Coping: Goal: Level of anxiety will decrease Outcome: Progressing   Problem: Pain Managment: Goal: General experience of comfort will improve Outcome: Progressing   Problem: Safety: Goal: Ability to remain free from injury will improve Outcome: Progressing

## 2022-06-13 NOTE — Progress Notes (Signed)
PT Cancellation Note  Patient Details Name: [name] MRN: [id number] DOB: Oct 28, 1951   Cancelled Treatment:    Reason Eval/Treat Not Completed: Patient at procedure or test/unavailable (Pt in HD this am. Will return as able.)   Alvira Philips 06/13/2022, 8:41 AM Terrina Docter M,PT Acute Rehab Services [phone]

## 2022-06-13 NOTE — Progress Notes (Signed)
PROGRESS NOTE    [name]  RKY:[id number]  DOB: [birth year]  DOA: 06/09/2022 PCP: [name], MD Outpatient Specialists:   Hospital course:  71 yo female with recent GI bleed secondary to angiodysplasia of duodenum, CAD s/p recent atherectomy/DES on 03/08/2022, heart failure, anemia, ESRD on HD, T2DM, hypertension and dyslipidemia was admitted 06/09/2022 with dyspnea and palpitations.  Work-up revealed likely acute blood loss anemia.  Eliquis and Brilinta were held.  Subjective:  Patient states she is very sleepy.  Has no complaints.  Objective: Vitals:   06/13/22 1200 06/13/22 1228 06/13/22 1244 06/13/22 1343  BP: (!) 148/75 (!) 151/59  (!) 118/56  Pulse: 86 86  90  Resp: 15 16  16   Temp:  97.6 F (36.4 C)  97.7 F (36.5 C)  TempSrc:  Axillary  Oral  SpO2: 100% 100%  100%  Weight:   75.2 kg   Height:        Intake/Output Summary (Last 24 hours) at 06/13/2022 1726 Last data filed at 06/13/2022 1228 Gross per 24 hour  Intake 360 ml  Output -2084 ml  Net 2444 ml   Filed Weights   06/12/22 0457 06/13/22 0039 06/13/22 1244  Weight: 76.6 kg 77.4 kg 75.2 kg     Exam:  General: Sleepy patient who is arousable by voice alone but reluctant to talk, states she would rather go to sleep. Eyes: sclera anicteric, conjuctiva mild injection bilaterally CVS: S1-S2, regular  Respiratory:  decreased air entry bilaterally secondary to decreased inspiratory effort, rales at bases  GI: NABS, soft, NT  LE: No edema.    Assessment & Plan:   Acute blood loss anemia Patient admitted with hemoglobin 9.0, it has drifted slowly down to 7.7 today. 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  Permanent atrial fibrillation Atenolol  and diltiazem for rate control Eliquis being restarted today as noted above  CAD S/p recent arthrectomy/DES on April 2023 Per cardiology, okay to [place] and start aspirin  DM 2 Continue present management  HTN Continue atenolol and diltiazem  ESRD on HD Underwent dialysis today  Anxiety and depression Continue quetiapine and ropinirole  HFpEF Euvolemic at present, fluid managed by HD  Debility/disposition PT consult pending    DVT prophylaxis: Eliquis Code Status: Full Family Communication: None Disposition Plan:   Patient is from: Home  Anticipated Discharge Location: TBD  Barriers to Discharge: Restarting anticoagulation  Is patient medically stable for Discharge: Not yet   Scheduled Meds:  apixaban  5 mg Oral BID   aspirin EC  81 mg Oral Daily   atenolol  100 mg Oral QHS   calcium acetate  1,334 mg Oral TID with meals   calcium acetate  667 mg Oral With snacks   Chlorhexidine Gluconate Cloth  6 each Topical Q0600   diltiazem  240 mg Oral Daily   gabapentin  800 mg Oral Q T,Th,Sa-HD   insulin aspart  0-5 Units Subcutaneous QHS   insulin aspart  0-6 Units Subcutaneous TID WC   insulin glargine-yfgn  10 Units Subcutaneous QHS   mometasone-formoterol  2 puff Inhalation BID   multivitamin  1 tablet Oral Daily   pantoprazole  40 mg Oral BID   QUEtiapine  200 mg Oral QHS  rOPINIRole  1 mg Oral QHS   rosuvastatin  5 mg Oral QHS   sodium chloride flush  3 mL Intravenous Q12H   Continuous Infusions:  Data Reviewed:  Basic Metabolic Panel: Recent Labs  Lab 06/09/22 2054 06/10/22 1007 06/11/22 0217 06/12/22 0346 06/13/22 0451  NA 135 137 136 136 131*  K 3.2* 3.4* 3.2* 3.6 4.0  CL 93* 94* 95* 92* 91*  CO2 28 24 28 27 25   GLUCOSE 224* 127* 147* 134* 148*  BUN 38* 41* 14 24* 34*  CREATININE 6.15* 7.58* 4.55* 6.73* 8.31*  CALCIUM 9.5 8.9 8.6* 9.5 9.1  MG  --  2.0  --   --   --   PHOS  --  4.8* 3.4 3.7 3.8    CBC: Recent Labs  Lab  06/08/22 2245 06/09/22 2054 06/10/22 1007 06/10/22 2008 06/11/22 0217 06/12/22 0346 06/13/22 0451  WBC 11.0* 9.7 10.9*  --  9.6 8.9 9.7  NEUTROABS 7.9* 7.1  --   --   --   --   --   HGB 9.5* 8.3* 7.9* 9.0* 8.3* 8.1* 7.7*  HCT 30.0* 25.9* 23.9* 28.4* 25.8* 25.7* 23.9*  MCV 86.0 85.2 84.8  --  85.4 86.5 84.5  PLT 479* 496* 443*  --  450* 449* 424*    Studies: No results found.  Active Problems:   Acute blood loss anemia   ESRD (end stage renal disease) on dialysis ([place])   Permanent atrial fibrillation ([place])   CAD S/P percutaneous coronary angioplasty   Type 2 diabetes mellitus with hyperlipidemia ([place])   Heart failure with preserved ejection fraction [facility])   Essential hypertension   Anxiety     Manie Bealer Derek Jack, [facility]  If 7PM-7AM, please contact night-coverage [email or web address]   LOS: 3 days

## 2022-06-13 NOTE — Significant Event (Signed)
Rapid Response Event Note   Reason for Call :  Decreased LOC.  Night shift RN received in shift report that pt was alert and oriented earlier but has been very sleepy ever since she came back from HD.   Initial Focused Assessment:  Pt lying in bed with eyes closed, in no visible distress. Pt will open eyes to voice, follow simple commands, and move all extremities. Pt requires frequent stimulation during exam because she falls asleep. Lungs clear, diminished in the bases. Skin cool to touch, dry.   T-97.4, HR-87, BP-127/72, RR-18, SpO2-100% on RA.  Interventions:  CBG-157 ABG-7.51/42/108/33.5 Plan of Care:     Event Summary:   MD Notified:  Call [phone] Arrival [phone] End Time:  Dillard Essex, RN

## 2022-06-13 NOTE — Progress Notes (Signed)
[facility] Progress Note   Subjective:    Seen and examined on HD. No acute complaints. Tolerating UFG 2L.   Objective Vitals:   06/13/22 1030 06/13/22 1100 06/13/22 1130 06/13/22 1200  BP: (!) 138/59 (!) 144/62 126/70 (!) 148/75  Pulse: 86 84 84 86  Resp: 16 16 17 15   Temp:      TempSrc:      SpO2: 100% 100% 100% 100%  Weight:      Height:       Physical Exam General: Well-appearing; NAD Heart: S1 and S2; No murmurs, gallops, or rubs Lungs: Clear throughout; No wheezing, rales, or rhonchi Abdomen: Soft and non-tender Extremities: No edema BLLE Dialysis Access:  R AVF (+) B/T   Filed Weights   06/11/22 0800 06/12/22 0457 06/13/22 0039  Weight: 76 kg 76.6 kg 77.4 kg    Intake/Output Summary (Last 24 hours) at 06/13/2022 1240 Last data filed at 06/13/2022 0356 Gross per 24 hour  Intake 480 ml  Output 150 ml  Net 330 ml    Additional Objective Labs: Basic Metabolic Panel: Recent Labs  Lab 06/11/22 0217 06/12/22 0346 06/13/22 0451  NA 136 136 131*  K 3.2* 3.6 4.0  CL 95* 92* 91*  CO2 28 27 25   GLUCOSE 147* 134* 148*  BUN 14 24* 34*  CREATININE 4.55* 6.73* 8.31*  CALCIUM 8.6* 9.5 9.1  PHOS 3.4 3.7 3.8   Liver Function Tests: Recent Labs  Lab 06/08/22 2245 06/09/22 2054 06/10/22 1007 06/11/22 0217 06/12/22 0346 06/13/22 0451  AST 27 16  --   --   --   --   ALT 49* 36  --   --   --   --   ALKPHOS 80 75  --   --   --   --   BILITOT 0.7 0.4  --   --   --   --   PROT 6.0* 5.9*  --   --   --   --   ALBUMIN 2.4* 3.2*   < > 2.2* 2.2* 2.3*   < > = values in this interval not displayed.   No results for input(s): "LIPASE", "AMYLASE" in the last 168 hours. CBC: Recent Labs  Lab 06/08/22 2245 06/09/22 2054 06/10/22 1007 06/10/22 2008 06/11/22 0217 06/12/22 0346 06/13/22 0451  WBC 11.0* 9.7 10.9*  --  9.6 8.9 9.7  NEUTROABS 7.9* 7.1  --   --   --   --   --   HGB 9.5* 8.3* 7.9*   < > 8.3* 8.1* 7.7*  HCT 30.0* 25.9* 23.9*   < > 25.8*  25.7* 23.9*  MCV 86.0 85.2 84.8  --  85.4 86.5 84.5  PLT 479* 496* 443*  --  450* 449* 424*   < > = values in this interval not displayed.   Blood Culture    Component Value Date/Time   SDES  04/25/2022 1844    URINE, CLEAN CATCH Performed at [facility], [address] [place], [place] [address]    [facility]  04/25/2022 1844    NONE Performed at [facility], [address] [place], [place] [address]    CULT (A) 04/25/2022 1844    <10,000 COLONIES/mL INSIGNIFICANT GROWTH Performed at [facility] [address], [place], [place] [address]    REPTSTATUS 04/27/2022 FINAL 04/25/2022 1844    Cardiac Enzymes: No results for input(s): "CKTOTAL", "CKMB", "CKMBINDEX", "TROPONINI" in the last 168 hours. CBG: Recent Labs  Lab 06/12/22  9528 06/12/22 1123 06/12/22 1611 06/12/22 2129 06/13/22 0607  GLUCAP 121* 116* 191* 307* 161*   Iron Studies: No results for input(s): "IRON", "TIBC", "TRANSFERRIN", "FERRITIN" in the last 72 hours. Lab Results  Component Value Date   INR 1.9 (H) 03/26/2022   INR 1.0 06/17/2008   Studies/Results: No results found.  Medications:  anticoagulant sodium citrate      aspirin EC  81 mg Oral Daily   atenolol  100 mg Oral QHS   calcium acetate  1,334 mg Oral TID with meals   calcium acetate  667 mg Oral With snacks   Chlorhexidine Gluconate Cloth  6 each Topical Q0600   diltiazem  240 mg Oral Daily   gabapentin  800 mg Oral Q T,Th,Sa-HD   insulin aspart  0-5 Units Subcutaneous QHS   insulin aspart  0-6 Units Subcutaneous TID WC   insulin glargine-yfgn  10 Units Subcutaneous QHS   mometasone-formoterol  2 puff Inhalation BID   multivitamin  1 tablet Oral Daily   pantoprazole  40 mg Oral BID   QUEtiapine  200 mg Oral QHS   rOPINIRole  1 mg Oral QHS   rosuvastatin  5 mg Oral QHS   sodium chloride flush  3 mL Intravenous Q12H    Dialysis Orders: Unit: [facility] TTS Schedule/Time: 4:00  EDW: 78.5 kg Flows: 350/500 Bath:  2K/2.5Ca Access: AVF Heparin: 1000 bolus then 1000/hr ESA: Mircera 200 q 2 weeks (last dosed on 7/4) VDRA: None  Assessment/Plan: Melena/Recurrent GIB. S/p EGD with AVMs on last admission. GI consulted.  S/p capsule endoscopy yesterday-no signs for bleeding. Per GI, recommend lower dose blood thinners, more frequent miralax, and further w/u if bleeding persist. ESRD -   HD TTS.  Continue on schedule --Hold heparin. On HD. Hypokalemia - K+ 4.0, continue 3K bath for now.  Hypertension/volume  - BP ok. Some volume on exam.  Anemia -Hgb now 7.7 s/p transfusion last admission. Follow trends. Received ESA at OP unit on 7/4.  Metabolic bone disease -  Ca/Phos ok. Continue home meds Permanent AFib - Rate controlled. Eliquis held. Continue Atenolol and Diltiazem. States OP cardiology was planning ablation.  Nutrition - Renal diet with fluid restriction   Tobie Poet, NP [facility] 06/13/2022,12:40 PM  LOS: 3 days

## 2022-06-13 NOTE — Progress Notes (Signed)
Received patient in bed, alert and oriented. Informed consent signed and in chart.  Time tx completed: 1228  HD treatment completed. Patient tolerated well. RUA Fistula without signs and symptoms of complications. Patient transported back to the room, alert and orient and in no acute distress. Report given to bedside RN.  Total UF removed: 2L  Medication given:  N/A   Post HD VS:  BP 151/59 Temp 97.6 HR 93 Resp 27 Spo2 100% on 2L  Post HD weight: 75.2kg

## 2022-06-13 NOTE — Progress Notes (Signed)
Mobility Specialist Progress Note    06/13/22 1524  Mobility  Activity Ambulated with assistance in room  Level of Assistance Contact guard assist, steadying assist  Assistive Device Front wheel walker  Distance Ambulated (ft) 30 ft  Activity Response Tolerated fair  $Mobility charge 1 Mobility   Pre-Mobility: 93 HR, 100% SpO2 Post-Mobility: 96 HR, 100/53 BP, 99% SpO2  Pt received in bed and agreeable. C/o being lightheaded and dizzy. Returned to sitting EOB with call bell in reach.    Hildred Alamin Mobility Specialist

## 2022-06-14 ENCOUNTER — Inpatient Hospital Stay (HOSPITAL_COMMUNITY): Payer: Medicare Other

## 2022-06-14 ENCOUNTER — Telehealth: Payer: Self-pay | Admitting: Cardiology

## 2022-06-14 ENCOUNTER — Inpatient Hospital Stay: Payer: Medicare Other | Admitting: Nurse Practitioner

## 2022-06-14 DIAGNOSIS — I5030 Unspecified diastolic (congestive) heart failure: Secondary | ICD-10-CM | POA: Diagnosis not present

## 2022-06-14 DIAGNOSIS — I2510 Atherosclerotic heart disease of native coronary artery without angina pectoris: Secondary | ICD-10-CM | POA: Diagnosis not present

## 2022-06-14 DIAGNOSIS — I4821 Permanent atrial fibrillation: Secondary | ICD-10-CM | POA: Diagnosis not present

## 2022-06-14 DIAGNOSIS — Z9861 Coronary angioplasty status: Secondary | ICD-10-CM | POA: Diagnosis not present

## 2022-06-14 LAB — GLUCOSE, CAPILLARY
Glucose-Capillary: 295 mg/dL — ABNORMAL HIGH (ref 70–99)
Glucose-Capillary: 198 mg/dL — ABNORMAL HIGH (ref 70–99)
Glucose-Capillary: 221 mg/dL — ABNORMAL HIGH (ref 70–99)
Glucose-Capillary: 223 mg/dL — ABNORMAL HIGH (ref 70–99)

## 2022-06-14 LAB — AMMONIA: Ammonia: 26 umol/L (ref 9–35)

## 2022-06-14 LAB — CBC
HCT: 25.3 % — ABNORMAL LOW (ref 36.0–46.0)
Hemoglobin: 7.9 g/dL — ABNORMAL LOW (ref 12.0–15.0)
MCH: 26.6 pg (ref 26.0–34.0)
MCHC: 31.2 g/dL (ref 30.0–36.0)
MCV: 85.2 fL (ref 80.0–100.0)
Platelets: 409 10*3/uL — ABNORMAL HIGH (ref 150–400)
RBC: 2.97 MIL/uL — ABNORMAL LOW (ref 3.87–5.11)
RDW: 17.8 % — ABNORMAL HIGH (ref 11.5–15.5)
WBC: 8.8 10*3/uL (ref 4.0–10.5)
nRBC: 0.2 % (ref 0.0–0.2)

## 2022-06-14 MED ORDER — LIDOCAINE HCL (PF) 1 % IJ SOLN: 5.0000 mL | INTRAMUSCULAR | Status: DC | PRN

## 2022-06-14 MED ORDER — GABAPENTIN 400 MG PO CAPS
400.0000 mg | ORAL_CAPSULE | ORAL | Status: DC
  Administered 2022-06-15: 400 mg via ORAL
  Filled 2022-06-14: qty 1

## 2022-06-14 MED ORDER — ALTEPLASE 2 MG IJ SOLR: 2.0000 mg | Freq: Once | INTRAMUSCULAR | Status: DC | PRN

## 2022-06-14 MED ORDER — QUETIAPINE FUMARATE 100 MG PO TABS
100.0000 mg | ORAL_TABLET | Freq: Every day | ORAL | Status: DC
  Administered 2022-06-14: 100 mg via ORAL
  Filled 2022-06-14: qty 1

## 2022-06-14 MED ORDER — ROPINIROLE HCL 0.5 MG PO TABS
0.5000 mg | ORAL_TABLET | Freq: Every day | ORAL | Status: DC
  Administered 2022-06-14: 0.5 mg via ORAL
  Filled 2022-06-14 (×2): qty 1

## 2022-06-14 MED ORDER — PENTAFLUOROPROP-TETRAFLUOROETH EX AERO: 1.0000 | INHALATION_SPRAY | CUTANEOUS | Status: DC | PRN

## 2022-06-14 MED ORDER — HEPARIN SODIUM (PORCINE) 1000 UNIT/ML DIALYSIS: 1000.0000 [IU] | INTRAMUSCULAR | Status: DC | PRN

## 2022-06-14 NOTE — Telephone Encounter (Signed)
  Patient is scheduled for [facility] hospital f/u on 06/22/22 at 10:05 am with Caron Presume per Dr Johnsie Cancel

## 2022-06-14 NOTE — Care Management Important Message (Signed)
Important Message  Patient Details  Name: [name] MRN: [id number] Date of Birth: [birth year]   Medicare Important Message Given:  Yes     [name] 06/14/2022, 10:40 AM

## 2022-06-14 NOTE — Progress Notes (Addendum)
Overnight event  Rapid response was called due to concern for change in patient's mental status.  I spoke to rapid response RN who informed me that patient is very somnolent.  She does wake up when her name is called but then goes back to sleep.  She is moving all her extremities and no focal neurodeficit noted.  Vital signs stable.  Reportedly she has been very somnolent since after dialysis this afternoon.    CBG 157, ABG showing pH 7.51, PCO2 42, PO2 108  MAR reviewed, patient is currently not on opiates.  She has not received any Ativan, Seroquel, hydroxyzine, gabapentin, or Requip tonight.  -Stat CT head ordered -Stat ammonia level -Hold sedating meds at this time -Continue to monitor very closely  Addendum/update: CT head negative for acute finding and ammonia level normal.  Informed by RN that patient is now awake, alert, and oriented x4.

## 2022-06-14 NOTE — Progress Notes (Addendum)
Progress Note  Patient Name: [name] Date of Encounter: 06/14/2022  [facility] Cardiologist: Minus Breeding, MD   Subjective   Patient reports feeling weak. Feels like she is unable to hold things in her hands due to weakness. Has been going on for a month.   Yesterday, patient was difficult to arouse. Rapid response was called. CT head negative for acute findings, ammonia level normal. Patient eventually woke up and was alert/oriented without intervention. Patient does not recall these events   Inpatient Medications    Scheduled Meds:  apixaban  5 mg Oral BID   aspirin EC  81 mg Oral Daily   atenolol  100 mg Oral QHS   calcium acetate  1,334 mg Oral TID with meals   calcium acetate  667 mg Oral With snacks   Chlorhexidine Gluconate Cloth  6 each Topical Q0600   diltiazem  240 mg Oral Daily   insulin aspart  0-5 Units Subcutaneous QHS   insulin aspart  0-6 Units Subcutaneous TID WC   insulin glargine-yfgn  10 Units Subcutaneous QHS   mometasone-formoterol  2 puff Inhalation BID   multivitamin  1 tablet Oral Daily   pantoprazole  40 mg Oral BID   rosuvastatin  5 mg Oral QHS   sodium chloride flush  3 mL Intravenous Q12H   Continuous Infusions:  PRN Meds: acetaminophen **OR** acetaminophen, albuterol, carboxymethylcellul-glycerin, lidocaine-prilocaine, loratadine   Vital Signs    Vitals:   06/13/22 1943 06/13/22 2002 06/13/22 2316 06/14/22 0439  BP:  (!) 122/53 127/72 (!) 117/54  Pulse: 83 88 87 100  Resp: 18 20  18   Temp:  98 F (36.7 C) (!) 97.4 F (36.3 C) 98.2 F (36.8 C)  TempSrc:  Oral Oral Oral  SpO2: 100% 98% 100% 99%  Weight:    75.7 kg  Height:        Intake/Output Summary (Last 24 hours) at 06/14/2022 0840 Last data filed at 06/14/2022 0046 Gross per 24 hour  Intake 3 ml  Output -2084 ml  Net 2087 ml      06/14/2022    4:39 AM 06/13/2022   12:44 PM 06/13/2022   12:39 AM  Last 3 Weights  Weight (lbs) 166 lb 14.2 oz 165 lb 12.6 oz  170 lb 10.2 oz  Weight (kg) 75.7 kg 75.2 kg 77.4 kg      Telemetry    Atrial fibrillation, HR in the 90s-100s  - Personally Reviewed  ECG    No new tracings since 7/7 - Personally Reviewed  Physical Exam   GEN: No acute distress.  Sitting at the side of the bed eating breakfast  Neck: No JVD Cardiac: Irregular rate and rhythm, no murmurs  Respiratory: Clear to auscultation bilaterally. Normal work of breathing GI: Soft, nontender, non-distended  MS: No edema; No deformity. Neuro:  Nonfocal  Psych: Normal affect   Labs    High Sensitivity Troponin:   Recent Labs  Lab 05/30/22 1803 05/30/22 1927 06/08/22 2245 06/09/22 2219 06/10/22 0454  TROPONINIHS 89* 92* 53* 33* 46*     Chemistry Recent Labs  Lab 06/08/22 2245 06/09/22 2054 06/10/22 1007 06/11/22 0217 06/12/22 0346 06/13/22 0451  NA 135 135 137 136 136 131*  K 3.4* 3.2* 3.4* 3.2* 3.6 4.0  CL 93* 93* 94* 95* 92* 91*  CO2 28 28 24 28 27 25   GLUCOSE 163* 224* 127* 147* 134* 148*  BUN 22 38* 41* 14 24* 34*  CREATININE 4.81* 6.15* 7.58* 4.55*  6.73* 8.31*  CALCIUM 9.1 9.5 8.9 8.6* 9.5 9.1  MG  --   --  2.0  --   --   --   PROT 6.0* 5.9*  --   --   --   --   ALBUMIN 2.4* 3.2* 2.3* 2.2* 2.2* 2.3*  AST 27 16  --   --   --   --   ALT 49* 36  --   --   --   --   ALKPHOS 80 75  --   --   --   --   BILITOT 0.7 0.4  --   --   --   --   GFRNONAA 9* 7* 5* 10* 6* 5*  ANIONGAP 14 14 19* 13 17* 15    Lipids No results for input(s): "CHOL", "TRIG", "HDL", "LABVLDL", "LDLCALC", "CHOLHDL" in the last 168 hours.  Hematology Recent Labs  Lab 06/12/22 0346 06/13/22 0451 06/14/22 0037  WBC 8.9 9.7 8.8  RBC 2.97* 2.83* 2.97*  HGB 8.1* 7.7* 7.9*  HCT 25.7* 23.9* 25.3*  MCV 86.5 84.5 85.2  MCH 27.3 27.2 26.6  MCHC 31.5 32.2 31.2  RDW 17.6* 17.7* 17.8*  PLT 449* 424* 409*   Thyroid No results for input(s): "TSH", "FREET4" in the last 168 hours.  BNPNo results for input(s): "BNP", "PROBNP" in the last 168 hours.   DDimer No results for input(s): "DDIMER" in the last 168 hours.   Radiology    CT HEAD WO CONTRAST (5MM)  Result Date: 06/14/2022 CLINICAL DATA:  Delirium EXAM: CT HEAD WITHOUT CONTRAST TECHNIQUE: Contiguous axial images were obtained from the base of the skull through the vertex without intravenous contrast. RADIATION DOSE REDUCTION: This exam was performed according to the departmental dose-optimization program which includes automated exposure control, adjustment of the mA and/or kV according to patient size and/or use of iterative reconstruction technique. COMPARISON:  None Available. FINDINGS: Brain: There is atrophy and chronic small vessel disease changes. No acute intracranial abnormality. Specifically, no hemorrhage, hydrocephalus, mass lesion, acute infarction, or significant intracranial injury. Vascular: No hyperdense vessel or unexpected calcification. Skull: No acute calvarial abnormality. Sinuses/Orbits: No acute findings Other: None IMPRESSION: Atrophy, chronic microvascular disease. No acute intracranial abnormality. Electronically Signed   By: Rolm Baptise M.D.   On: 06/14/2022 01:31    Cardiac Studies   INTRAVASCULAR IMAGING/OCT  03/08/2022  CORONARY ATHERECTOMY    Conclusion   1.  Successful atherectomy, PTCA, and stenting of severe stenosis in the mid LAD, reducing 90% calcific stenosis to 0%, TIMI-3 flow pre and post, with a 3.0 x 18 mm Onyx frontier DES 2.  Successful atherectomy, PTCA, and stenting of severe stenosis in the mid RCA, reducing 95% calcific stenosis to 0%, TIMI-3 flow pre and post, with a 3.0 x 34 mm Onyx frontier DES   Recommendations: Patient with atrial fibrillation, significant comorbidities of end-stage renal disease.  Considering balance of antithrombotic efficacy and concern over bleeding risk with multidrug therapy, favor the following approach: Resume apixaban tomorrow at normal dosing schedule, no heparin overnight Ticagrelor 90 mg twice daily x3  months Aspirin 81 mg daily long-term after 3 months of ticagrelor completed     Patient Profile     71 y.o. female  with CAD, persistent atrial fibrillation, ESRD on hemodialysis, COPD, HFpEF, right heart failure who was admitted on 06/10/2022 with chest pain and recurrent anemia secondary to presumed GI bleed.   Recently admitted for for chest pain secondary to gastritis and bleeding duodenal  AVM.  S/p intervention.  Now back with recurrent symptoms and drop in hemoglobin.    Assessment & Plan    Chest pain with history of CAD Acute blood loss anemia Gastritis - Felt safe to hold Brilinta as she is out of 3 months since her PCI. As recommended in Cath report, plans to start aspirin after 3 months of ticagrelor completion.    - Resumed 81 mg ASA yesterday with approval from GI  - EKG nonischemic.  Troponin mildly elevated and flat trend (53>>22>>46)  Not consistent with ACS. - capsule endoscopy negative  - Symptoms felt more due to GI issues greater than cardiac etiology - Continue atenolol and Crestor - Transfusion per primary team (if needed)-- hemoglobin 7.9 and hematocrit 25.3 this AM (stable)    Permanent atrial fibrillation - Continue beta-blocker and Cardizem - Resumed eliquis yesterday with approval from GI-- hemoglobin and hematocrit stable   - Encouraged patient to continue to monitor for signs of bleeding, especially coughing up blood, bloody/very dark stools, blood in urine. Patient voiced understanding    HFrEF/right heart failure/ESRD Volume managed by dialysis      For questions or updates, please contact [facility] Please consult [email or web address] for contact info under        Signed, Margie Billet, PA-C  06/14/2022, 8:40 AM    Patient examined chart reviewed MS better now Hct stable Back on ASA and eliquis Volume managed with dialysis  Cardiology will sign off Will arrange outpatient f/u with Dr Percival Spanish  Jenkins Rouge MD [facility]

## 2022-06-14 NOTE — Progress Notes (Signed)
Patient placed on overnight pulse ox.

## 2022-06-14 NOTE — Progress Notes (Signed)
[facility] Progress Note   Subjective:    Seen and examined patient at bedside. Noted RRT was called overnight d/t decreased LOC after HD. CT head and ammonia level were normal with no evidence of hypoglycemia. Noted patient spontaneously recovered with mentation back to baseline. Patient tolerated yesterday's HD what appears net UF 2L. Patient seen sitting in recliner A&O X4. Denies SOB, CP, and N/V. She expresses she doesn't feel ready to go home today and wants to receive HD tomorrow morning before discharge.  Objective Vitals:   06/13/22 2002 06/13/22 2316 06/14/22 0439 06/14/22 1136  BP: (!) 122/53 127/72 (!) 117/54 115/64  Pulse: 88 87 100 93  Resp: 20  18   Temp: 98 F (36.7 C) (!) 97.4 F (36.3 C) 98.2 F (36.8 C) 97.8 F (36.6 C)  TempSrc: Oral Oral Oral Oral  SpO2: 98% 100% 99% 95%  Weight:   75.7 kg   Height:       Physical Exam General: Well-appearing; A&O X 4; NAD Heart: S1 and S2; No murmurs, gallops, or rubs Lungs: Clear throughout; No wheezing, rales, or rhonchi Abdomen: Soft and non-tender Extremities: No edema BLLE Dialysis Access:  R AVF (+) B/T   Filed Weights   06/13/22 0039 06/13/22 1244 06/14/22 0439  Weight: 77.4 kg 75.2 kg 75.7 kg    Intake/Output Summary (Last 24 hours) at 06/14/2022 1339 Last data filed at 06/14/2022 1100 Gross per 24 hour  Intake 123 ml  Output --  Net 123 ml    Additional Objective Labs: Basic Metabolic Panel: Recent Labs  Lab 06/11/22 0217 06/12/22 0346 06/13/22 0451  NA 136 136 131*  K 3.2* 3.6 4.0  CL 95* 92* 91*  CO2 28 27 25   GLUCOSE 147* 134* 148*  BUN 14 24* 34*  CREATININE 4.55* 6.73* 8.31*  CALCIUM 8.6* 9.5 9.1  PHOS 3.4 3.7 3.8   Liver Function Tests: Recent Labs  Lab 06/08/22 2245 06/09/22 2054 06/10/22 1007 06/11/22 0217 06/12/22 0346 06/13/22 0451  AST 27 16  --   --   --   --   ALT 49* 36  --   --   --   --   ALKPHOS 80 75  --   --   --   --   BILITOT 0.7 0.4  --   --    --   --   PROT 6.0* 5.9*  --   --   --   --   ALBUMIN 2.4* 3.2*   < > 2.2* 2.2* 2.3*   < > = values in this interval not displayed.   No results for input(s): "LIPASE", "AMYLASE" in the last 168 hours. CBC: Recent Labs  Lab 06/08/22 2245 06/09/22 2054 06/10/22 1007 06/10/22 2008 06/11/22 0217 06/12/22 0346 06/13/22 0451 06/14/22 0037  WBC 11.0* 9.7 10.9*  --  9.6 8.9 9.7 8.8  NEUTROABS 7.9* 7.1  --   --   --   --   --   --   HGB 9.5* 8.3* 7.9*   < > 8.3* 8.1* 7.7* 7.9*  HCT 30.0* 25.9* 23.9*   < > 25.8* 25.7* 23.9* 25.3*  MCV 86.0 85.2 84.8  --  85.4 86.5 84.5 85.2  PLT 479* 496* 443*  --  450* 449* 424* 409*   < > = values in this interval not displayed.   Blood Culture    Component Value Date/Time   SDES  04/25/2022 1844    URINE, CLEAN CATCH Performed at  [facility], [address], [place], [place] [address]    [facility]  04/25/2022 1844    NONE Performed at [facility], [address], [place], [place] [address]    CULT (A) 04/25/2022 1844    <10,000 COLONIES/mL INSIGNIFICANT GROWTH Performed at [facility] [address], [place], [place] [address]    REPTSTATUS 04/27/2022 FINAL 04/25/2022 1844    Cardiac Enzymes: No results for input(s): "CKTOTAL", "CKMB", "CKMBINDEX", "TROPONINI" in the last 168 hours. CBG: Recent Labs  Lab 06/13/22 1433 06/13/22 1611 06/13/22 2211 06/14/22 0617 06/14/22 1132  GLUCAP 169* 280* 157* 295* 198*   Iron Studies: No results for input(s): "IRON", "TIBC", "TRANSFERRIN", "FERRITIN" in the last 72 hours. Lab Results  Component Value Date   INR 1.9 (H) 03/26/2022   INR 1.0 06/17/2008   Studies/Results: CT HEAD WO CONTRAST (5MM)  Result Date: 06/14/2022 CLINICAL DATA:  Delirium EXAM: CT HEAD WITHOUT CONTRAST TECHNIQUE: Contiguous axial images were obtained from the base of the skull through the vertex without intravenous contrast. RADIATION DOSE REDUCTION: This exam was performed according to the departmental  dose-optimization program which includes automated exposure control, adjustment of the mA and/or kV according to patient size and/or use of iterative reconstruction technique. COMPARISON:  None Available. FINDINGS: Brain: There is atrophy and chronic small vessel disease changes. No acute intracranial abnormality. Specifically, no hemorrhage, hydrocephalus, mass lesion, acute infarction, or significant intracranial injury. Vascular: No hyperdense vessel or unexpected calcification. Skull: No acute calvarial abnormality. Sinuses/Orbits: No acute findings Other: None IMPRESSION: Atrophy, chronic microvascular disease. No acute intracranial abnormality. Electronically Signed   By: Rolm Baptise M.D.   On: 06/14/2022 01:31    Medications:   apixaban  5 mg Oral BID   aspirin EC  81 mg Oral Daily   atenolol  100 mg Oral QHS   calcium acetate  1,334 mg Oral TID with meals   calcium acetate  667 mg Oral With snacks   Chlorhexidine Gluconate Cloth  6 each Topical Q0600   diltiazem  240 mg Oral Daily   insulin aspart  0-5 Units Subcutaneous QHS   insulin aspart  0-6 Units Subcutaneous TID WC   insulin glargine-yfgn  10 Units Subcutaneous QHS   mometasone-formoterol  2 puff Inhalation BID   multivitamin  1 tablet Oral Daily   pantoprazole  40 mg Oral BID   rosuvastatin  5 mg Oral QHS   sodium chloride flush  3 mL Intravenous Q12H    Dialysis Orders: Unit: [facility] TTS Schedule/Time: 4:00  EDW: 78.5 kg Flows: 350/500 Bath: 2K/2.5Ca Access: AVF Heparin: 1000 bolus then 1000/hr ESA: Mircera 200 q 2 weeks (last dosed on 7/4) VDRA: None   Assessment/Plan: Melena/Recurrent GIB. S/p EGD with AVMs on last admission. GI consulted.  S/p capsule endoscopy-no signs for bleeding. Per GI, recommend lower dose blood thinners, more frequent miralax, and further w/u if bleeding persist. ESRD -   HD TTS.  Continue on schedule --Hold heparin. HD 7/13 in AM. Hypokalemia - K+ 4.0, continue 3K bath for now.   Hypertension/volume  - BP ok. Some volume on exam.  Anemia -Hgb now 7.9 s/p transfusion last admission. Follow trends. Received ESA at OP unit on 7/4.  Metabolic bone disease -  Ca/Phos ok. Continue home meds Permanent AFib - Rate controlled. Eliquis held. Continue Atenolol and Diltiazem. States OP cardiology was planning ablation.  Nutrition - Renal diet with fluid restriction  Dispo-spoke to Hospitalist, plan for dc tomorrow. Plan for HD  1st shift before discharge. Patient is medically stable from renal standpoint.  Tobie Poet, NP [facility] 06/14/2022,1:39 PM  LOS: 4 days

## 2022-06-14 NOTE — Telephone Encounter (Signed)
Still admitted 06/14/22

## 2022-06-14 NOTE — TOC Progression Note (Signed)
Transition of Care [facility]) - Progression Note    Patient Details  Name: [name] MRN: [id number] Date of Birth: 12/09/1950  Transition of Care [facility]) CM/SW Contact  Zenon Mayo, RN Phone Number: 06/14/2022, 2:07 PM  Clinical Narrative:    Patient lives with her mother, she has support system at home with Mom, her daughters, Ellard Artis, and [place].  She states she has been set up by [facility] with [facility] transportation. She said they were supposed to transport her on Tuesday but she was here in the hospital.  Patient spoke with Ronny Bacon at [place] in the room on the phone , she states she has faxed a form to [facility] and left a message stating she will need transport on Saturday to Dialysis.  Ronny Bacon states she did not get to speak to anyone on the phone but hopefully they will get her fax and voicemail.  Patient states her daughter will transport her home tomorrow after HD. She is also active with [facility] for [facility], HHPT, NCM offered choice, she states she would like to continue with [facility].  NCM confirmed with Tommi Rumps he confirmed that she has HHRN, HHPT.  NCM informed MD will need [place] orders.  Also patient states she gets sob at night and she gets scared and comes to the hospital.  NCM will ask MD to see if we can do a overnight pulse oximetry for nocturnal oxygen.  TOC following.    Expected Discharge Plan: [place] Barriers to Discharge: Continued Medical Work up  Expected Discharge Plan and Services Expected Discharge Plan: [place] In-house Referral: NA Discharge Planning Services: CM Consult Post Acute Care Choice: [place] arrangements for the past 2 months: Single Family Home                   DME Agency: NA       HH Arranged: RN, Disease Management, PT HH Agency: [place] Date [facility] Agency Contacted: 06/14/22 Time [place]: 1406 Representative spoke with at [place]: [place] ([place]) Interventions    Readmission Risk Interventions    05/31/2022    8:33 AM 04/28/2022    2:04 PM 04/26/2022    8:27 AM  Readmission Risk Prevention Plan  Transportation Screening Complete Complete Complete  Medication Review Press photographer) Complete Complete Complete  PCP or Specialist appointment within 3-5 days of discharge  Complete   HRI or [place] Complete Complete Complete  SW Recovery Care/Counseling Consult   Complete  Palliative Care Screening Not Applicable  Not [place] Not Applicable  Not Applicable
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
## 2022-06-14 NOTE — Evaluation (Signed)
Physical Therapy Evaluation Patient Details Name: [name] MRN: [id number] DOB: [birth year] Today's Date: 06/14/2022  History of Present Illness  71 y.o. female presents to [facility] hospital on 06/08/2022 with chest discomfort and malaise. Pt recently hospitalized 6/27-7/2 with symptomatic anemia 2/2 GIB. Pt with drop in Hgb to 8.3 since previous admission, again admitted for management of acute blood loss anemia. Pt underwent capsule endoscopy on 7/10, findings negative. PMH includes GIB, CAD, heart failure, ESRD on HD, DMII, HTN, HLD.  Clinical Impression  Pt presents to PT with deficits in strength, power, functional mobility, balance, endurance. Pt with reports of considerable weakness compared to baseline, mobilizing with high guard when without UE support. Pt demonstrates improved stability with support of RW, able to ambulate for household distances. Pt will benefit from aggressive mobilization in an effort to reduce falls risk and aide in a return to her baseline of independence and community mobility. PT recommends HHPT and use of a RW at the time of discharge.       Recommendations for follow up therapy are one component of a multi-disciplinary discharge planning process, led by the attending physician.  Recommendations may be updated based on patient status, additional functional criteria and insurance authorization.  Follow Up Recommendations Home health PT      Assistance Recommended at Discharge Intermittent Supervision/Assistance  Patient can return home with the following  A little help with walking and/or transfers;A little help with bathing/dressing/bathroom;Assistance with cooking/housework;Help with stairs or ramp for entrance    Equipment Recommendations None recommended by PT (pt owns RW)  Recommendations for Other Services       Functional Status Assessment Patient has had a recent decline in their functional status and demonstrates the ability to make significant  improvements in function in a reasonable and predictable amount of time.     Precautions / Restrictions Precautions Precautions: Fall Restrictions Weight Bearing Restrictions: No      Mobility  Bed Mobility Overal bed mobility: Modified Independent                  Transfers Overall transfer level: Needs assistance Equipment used: None Transfers: Sit to/from Stand Sit to Stand: Min guard                Ambulation/Gait Ambulation/Gait assistance: Supervision Gait Distance (Feet): 120 Feet Assistive device: Rolling walker (2 wheels) Gait Pattern/deviations: Step-through pattern Gait velocity: reduced Gait velocity interpretation: <1.31 ft/sec, indicative of household ambulator   General Gait Details: pt with slowed step-through gait with RW, initial 5' without device, short shuffling steps with increased lateral sway and high guard  Stairs            Wheelchair Mobility    Modified Rankin (Stroke Patients Only)       Balance Overall balance assessment: Needs assistance Sitting-balance support: Feet supported, No upper extremity supported Sitting balance-Leahy Scale: Good     Standing balance support: No upper extremity supported Standing balance-Leahy Scale: Fair Standing balance comment: reliant on UE support for dynamic balance at this time                             Pertinent Vitals/Pain Pain Assessment Pain Assessment: Faces Faces Pain Scale: Hurts even more Pain Location: legs Pain Descriptors / Indicators: Burning Pain Intervention(s): Monitored during session    Home Living Family/patient expects to be discharged to:: Private residence Living Arrangements: Parent Available Help at Discharge: Family;Available PRN/intermittently Type  of Home: House Home Access: Stairs to enter Entrance Stairs-Rails: Psychiatric nurse of Steps: 2   Home Layout: One level Home Equipment: Regulatory affairs officer (2 wheels);Cane - single point      Prior Function Prior Level of Function : Independent/Modified Independent;Driving             Mobility Comments: drives herself to dialysis       Hand Dominance   Dominant Hand: Right    Extremity/Trunk Assessment   Upper Extremity Assessment Upper Extremity Assessment: Generalized weakness    Lower Extremity Assessment Lower Extremity Assessment: Generalized weakness    Cervical / Trunk Assessment Cervical / Trunk Assessment: Normal  Communication   Communication: No difficulties  Cognition Arousal/Alertness: Awake/alert Behavior During Therapy: WFL for tasks assessed/performed Overall Cognitive Status: Within Functional Limits for tasks assessed                                          General Comments General comments (skin integrity, edema, etc.): VSS on RA    Exercises     Assessment/Plan    PT Assessment Patient needs continued PT services  PT Problem List Decreased strength;Decreased activity tolerance;Decreased balance;Decreased mobility;Decreased knowledge of use of DME       PT Treatment Interventions DME instruction;Gait training;Stair training;Functional mobility training;Therapeutic exercise;Therapeutic activities;Balance training;Neuromuscular re-education;Patient/family education    PT Goals (Current goals can be found in the Care Plan section)  Acute Rehab PT Goals Patient Stated Goal: to regain strength and ability to mobilize independently in the community PT Goal Formulation: With patient Time For Goal Achievement: 06/28/22 Potential to Achieve Goals: Good    Frequency Min 3X/week     Co-evaluation               AM-PAC PT "6 Clicks" Mobility  Outcome Measure Help needed turning from your back to your side while in a flat bed without using bedrails?: None Help needed moving from lying on your back to sitting on the side of a flat bed without using bedrails?:  None Help needed moving to and from a bed to a chair (including a wheelchair)?: A Little Help needed standing up from a chair using your arms (e.g., wheelchair or bedside chair)?: A Little Help needed to walk in hospital room?: A Little Help needed climbing 3-5 steps with a railing? : A Little 6 Click Score: 20    End of Session   Activity Tolerance: Patient tolerated treatment well Patient left: in chair;with call bell/phone within reach Nurse Communication: Mobility status PT Visit Diagnosis: Unsteadiness on feet (R26.81);Muscle weakness (generalized) (M62.81)    Time: 9563-8756 PT Time Calculation (min) (ACUTE ONLY): 18 min   Charges:   PT Evaluation $PT Eval Low Complexity: [place], PT, DPT Acute Rehabilitation Office [phone]   Zenaida Niece 06/14/2022, 11:24 AM

## 2022-06-14 NOTE — Progress Notes (Signed)
PROGRESS NOTE    [name]  XBL:[id number]  DOB: May 10, 1951  DOA: 06/09/2022 PCP: [name], MD Outpatient Specialists:   Hospital course:  71 yo female with recent GI bleed secondary to angiodysplasia of duodenum, CAD s/p recent atherectomy/DES on 03/08/2022, heart failure, anemia, ESRD on HD, T2DM, hypertension and dyslipidemia was admitted 06/09/2022 with dyspnea and palpitations.  Work-up revealed likely acute blood loss anemia.  Eliquis and Brilinta were held.  Subjective:  Patient does not know why she had an episode of somnolence yesterday.  She states she has no memory of that.  Notes that she has persistent insomnia and has not found Seroquel or hydroxyzine to be very helpful.  At present patient is not confused at all.  Notes she worked with PT and was able to walk with assistive devices.  Is worried about how she is going to get to HD tomorrow though.  Is requesting to stay until tomorrow until after her hemodialysis session here.  Objective: Vitals:   06/13/22 2002 06/13/22 2316 06/14/22 0439 06/14/22 1136  BP: (!) 122/53 127/72 (!) 117/54 115/64  Pulse: 88 87 100 93  Resp: 20  18   Temp: 98 F (36.7 C) (!) 97.4 F (36.3 C) 98.2 F (36.8 C) 97.8 F (36.6 C)  TempSrc: Oral Oral Oral Oral  SpO2: 98% 100% 99% 95%  Weight:   75.7 kg   Height:        Intake/Output Summary (Last 24 hours) at 06/14/2022 1516 Last data filed at 06/14/2022 1100 Gross per 24 hour  Intake 123 ml  Output --  Net 123 ml    Filed Weights   06/13/22 0039 06/13/22 1244 06/14/22 0439  Weight: 77.4 kg 75.2 kg 75.7 kg     Exam:  General: Sleepy patient who is arousable by voice alone but reluctant to talk, states she would rather go to sleep. Eyes: sclera anicteric, conjuctiva mild injection bilaterally CVS: S1-S2, regular  Respiratory:  decreased air entry bilaterally secondary to decreased inspiratory effort, rales at bases  GI: NABS, soft, NT  LE: No edema.    Assessment  & Plan:   Acute blood loss anemia Eliquis and aspirin started yesterday H&H has remained stable overnight without evidence of GI bleed Patient has remained hemodynamically stablePatient with known AVMs in duodenum Capsule endoscopy was negative Will need to follow H&H and BP closely as we restart anticoagulation with both Eliquis and aspirin.  Episode of somnolence yesterday Unclear etiology, patient is entirely back to baseline today, work-up negative She notes that she does not sleep at night and has chronic insomnia Possibly she was just sleeping very heavily, is now entirely back to baseline.  ESRD on HD Patient to undergo dialysis tomorrow and then be discharged.  Permanent atrial fibrillation Atenolol and diltiazem for rate control Eliquis restarted yesterday  CAD S/p recent arthrectomy/DES on April 2023 Per cardiology, okay to [place] and start aspirin Aspirin started yesterday  DM 2 Continue present management  HTN Continue atenolol and diltiazem  Anxiety and depression/insomnia Continue quetiapine and ropinirole  HFpEF Euvolemic at present, fluid managed by HD  Debility/disposition PT consult pending    DVT prophylaxis: Eliquis Code Status: Full Family Communication: None Disposition Plan:   Patient is from: Home  Anticipated Discharge Location: TBD  Barriers to Discharge: Restarting anticoagulation  Is patient medically stable for Discharge: Not yet   Scheduled Meds:  apixaban  5 mg Oral BID   aspirin EC  81 mg Oral Daily  atenolol  100 mg Oral QHS   calcium acetate  1,334 mg Oral TID with meals   calcium acetate  667 mg Oral With snacks   Chlorhexidine Gluconate Cloth  6 each Topical Q0600   diltiazem  240 mg Oral Daily   insulin aspart  0-5 Units Subcutaneous QHS   insulin aspart  0-6 Units Subcutaneous TID WC   insulin glargine-yfgn  10 Units Subcutaneous QHS   mometasone-formoterol  2 puff Inhalation BID   multivitamin  1 tablet Oral  Daily   pantoprazole  40 mg Oral BID   rosuvastatin  5 mg Oral QHS   sodium chloride flush  3 mL Intravenous Q12H   Continuous Infusions:  Data Reviewed:  Basic Metabolic Panel: Recent Labs  Lab 06/09/22 2054 06/10/22 1007 06/11/22 0217 06/12/22 0346 06/13/22 0451  NA 135 137 136 136 131*  K 3.2* 3.4* 3.2* 3.6 4.0  CL 93* 94* 95* 92* 91*  CO2 28 24 28 27 25   GLUCOSE 224* 127* 147* 134* 148*  BUN 38* 41* 14 24* 34*  CREATININE 6.15* 7.58* 4.55* 6.73* 8.31*  CALCIUM 9.5 8.9 8.6* 9.5 9.1  MG  --  2.0  --   --   --   PHOS  --  4.8* 3.4 3.7 3.8     CBC: Recent Labs  Lab 06/08/22 2245 06/09/22 2054 06/10/22 1007 06/10/22 2008 06/11/22 0217 06/12/22 0346 06/13/22 0451 06/14/22 0037  WBC 11.0* 9.7 10.9*  --  9.6 8.9 9.7 8.8  NEUTROABS 7.9* 7.1  --   --   --   --   --   --   HGB 9.5* 8.3* 7.9* 9.0* 8.3* 8.1* 7.7* 7.9*  HCT 30.0* 25.9* 23.9* 28.4* 25.8* 25.7* 23.9* 25.3*  MCV 86.0 85.2 84.8  --  85.4 86.5 84.5 85.2  PLT 479* 496* 443*  --  450* 449* 424* 409*     Studies: CT HEAD WO CONTRAST (5MM)  Result Date: 06/14/2022 CLINICAL DATA:  Delirium EXAM: CT HEAD WITHOUT CONTRAST TECHNIQUE: Contiguous axial images were obtained from the base of the skull through the vertex without intravenous contrast. RADIATION DOSE REDUCTION: This exam was performed according to the departmental dose-optimization program which includes automated exposure control, adjustment of the mA and/or kV according to patient size and/or use of iterative reconstruction technique. COMPARISON:  None Available. FINDINGS: Brain: There is atrophy and chronic small vessel disease changes. No acute intracranial abnormality. Specifically, no hemorrhage, hydrocephalus, mass lesion, acute infarction, or significant intracranial injury. Vascular: No hyperdense vessel or unexpected calcification. Skull: No acute calvarial abnormality. Sinuses/Orbits: No acute findings Other: None IMPRESSION: Atrophy, chronic  microvascular disease. No acute intracranial abnormality. Electronically Signed   By: Rolm Baptise M.D.   On: 06/14/2022 01:31    Active Problems:   Acute blood loss anemia   ESRD (end stage renal disease) on dialysis ([place])   Permanent atrial fibrillation ([place])   CAD S/P percutaneous coronary angioplasty   Type 2 diabetes mellitus with hyperlipidemia ([place])   Heart failure with preserved ejection fraction [facility])   Essential hypertension   Anxiety     Mallory Schaad Derek Jack, [facility]  If 7PM-7AM, please contact night-coverage [email or web address]   LOS: 4 days

## 2022-06-15 DIAGNOSIS — D62 Acute posthemorrhagic anemia: Secondary | ICD-10-CM | POA: Diagnosis not present

## 2022-06-15 LAB — CBC
HCT: 24.6 % — ABNORMAL LOW (ref 36.0–46.0)
Hemoglobin: 7.8 g/dL — ABNORMAL LOW (ref 12.0–15.0)
MCH: 27 pg (ref 26.0–34.0)
MCHC: 31.7 g/dL (ref 30.0–36.0)
MCV: 85.1 fL (ref 80.0–100.0)
Platelets: 425 10*3/uL — ABNORMAL HIGH (ref 150–400)
RBC: 2.89 MIL/uL — ABNORMAL LOW (ref 3.87–5.11)
RDW: 17.9 % — ABNORMAL HIGH (ref 11.5–15.5)
WBC: 11.8 10*3/uL — ABNORMAL HIGH (ref 4.0–10.5)
nRBC: 0.3 % — ABNORMAL HIGH (ref 0.0–0.2)

## 2022-06-15 LAB — GLUCOSE, CAPILLARY
Glucose-Capillary: 214 mg/dL — ABNORMAL HIGH (ref 70–99)
Glucose-Capillary: 330 mg/dL — ABNORMAL HIGH (ref 70–99)

## 2022-06-15 LAB — RENAL FUNCTION PANEL
Albumin: 2.2 g/dL — ABNORMAL LOW (ref 3.5–5.0)
Anion gap: 12 (ref 5–15)
BUN: 29 mg/dL — ABNORMAL HIGH (ref 8–23)
CO2: 25 mmol/L (ref 22–32)
Calcium: 9.4 mg/dL (ref 8.9–10.3)
Chloride: 91 mmol/L — ABNORMAL LOW (ref 98–111)
Creatinine, Ser: 7.52 mg/dL — ABNORMAL HIGH (ref 0.44–1.00)
GFR, Estimated: 5 mL/min — ABNORMAL LOW (ref >60)
Glucose, Bld: 235 mg/dL — ABNORMAL HIGH (ref 70–99)
Phosphorus: 3 mg/dL (ref 2.5–4.6)
Potassium: 4 mmol/L (ref 3.5–5.1)
Sodium: 128 mmol/L — ABNORMAL LOW (ref 135–145)

## 2022-06-15 MED ORDER — CETIRIZINE HCL 10 MG PO TABS: 10.0000 mg | ORAL_TABLET | Freq: Every day | ORAL | 1 refills | Status: AC | PRN

## 2022-06-15 MED ORDER — QUETIAPINE FUMARATE 100 MG PO TABS: 100.0000 mg | ORAL_TABLET | Freq: Every day | ORAL | 0 refills | Status: DC

## 2022-06-15 MED ORDER — ROPINIROLE HCL 0.5 MG PO TABS: 0.5000 mg | ORAL_TABLET | Freq: Every day | ORAL | 0 refills | Status: DC

## 2022-06-15 MED ORDER — ASPIRIN 81 MG PO TBEC
81.0000 mg | DELAYED_RELEASE_TABLET | Freq: Every day | ORAL | 12 refills | Status: AC
Start: 2022-06-15 — End: ?

## 2022-06-15 NOTE — Progress Notes (Signed)
[facility] Progress Note   Subjective:    Seen and examined on HD. Tolerating UFG 1L. No acute issues. She may go home today.  Objective Vitals:   06/15/22 0834 06/15/22 0841 06/15/22 0856 06/15/22 0925  BP:  121/73 119/70 123/70  Pulse:  88 86 91  Resp:  17 19 18   Temp:      TempSrc:      SpO2:  93% 92% 94%  Weight: 80 kg     Height:       Physical Exam General: Well-appearing; A&O X 4; NAD Heart: S1 and S2; No murmurs, gallops, or rubs Lungs: Clear throughout; No wheezing, rales, or rhonchi Abdomen: Soft and non-tender Extremities: No edema BLLE Dialysis Access:  R AVF (+) B/T   Filed Weights   06/13/22 1244 06/14/22 0439 06/15/22 0834  Weight: 75.2 kg 75.7 kg 80 kg    Intake/Output Summary (Last 24 hours) at 06/15/2022 0933 Last data filed at 06/15/2022 0000 Gross per 24 hour  Intake 483 ml  Output --  Net 483 ml    Additional Objective Labs: Basic Metabolic Panel: Recent Labs  Lab 06/12/22 0346 06/13/22 0451 06/15/22 0307  NA 136 131* 128*  K 3.6 4.0 4.0  CL 92* 91* 91*  CO2 27 25 25   GLUCOSE 134* 148* 235*  BUN 24* 34* 29*  CREATININE 6.73* 8.31* 7.52*  CALCIUM 9.5 9.1 9.4  PHOS 3.7 3.8 3.0   Liver Function Tests: Recent Labs  Lab 06/08/22 2245 06/09/22 2054 06/10/22 1007 06/12/22 0346 06/13/22 0451 06/15/22 0307  AST 27 16  --   --   --   --   ALT 49* 36  --   --   --   --   ALKPHOS 80 75  --   --   --   --   BILITOT 0.7 0.4  --   --   --   --   PROT 6.0* 5.9*  --   --   --   --   ALBUMIN 2.4* 3.2*   < > 2.2* 2.3* 2.2*   < > = values in this interval not displayed.   No results for input(s): "LIPASE", "AMYLASE" in the last 168 hours. CBC: Recent Labs  Lab 06/08/22 2245 06/09/22 2054 06/10/22 1007 06/11/22 0217 06/12/22 0346 06/13/22 0451 06/14/22 0037 06/15/22 0307  WBC 11.0* 9.7   < > 9.6 8.9 9.7 8.8 11.8*  NEUTROABS 7.9* 7.1  --   --   --   --   --   --   HGB 9.5* 8.3*   < > 8.3* 8.1* 7.7* 7.9* 7.8*  HCT  30.0* 25.9*   < > 25.8* 25.7* 23.9* 25.3* 24.6*  MCV 86.0 85.2   < > 85.4 86.5 84.5 85.2 85.1  PLT 479* 496*   < > 450* 449* 424* 409* 425*   < > = values in this interval not displayed.   Blood Culture    Component Value Date/Time   SDES  04/25/2022 1844    URINE, CLEAN CATCH Performed at [facility], [address], [place], [place] [address]    [facility]  04/25/2022 1844    NONE Performed at [facility], [address], [place], [place] [address]    CULT (A) 04/25/2022 1844    <10,000 COLONIES/mL INSIGNIFICANT GROWTH Performed at [facility] [address] [place], [place] [address]    REPTSTATUS 04/27/2022 FINAL 04/25/2022 1844    Cardiac Enzymes: No results  for input(s): "CKTOTAL", "CKMB", "CKMBINDEX", "TROPONINI" in the last 168 hours. CBG: Recent Labs  Lab 06/14/22 0617 06/14/22 1132 06/14/22 1613 06/14/22 2135 06/15/22 0621  GLUCAP 295* 198* 221* 223* 214*   Iron Studies: No results for input(s): "IRON", "TIBC", "TRANSFERRIN", "FERRITIN" in the last 72 hours. Lab Results  Component Value Date   INR 1.9 (H) 03/26/2022   INR 1.0 06/17/2008   Studies/Results: CT HEAD WO CONTRAST (5MM)  Result Date: 06/14/2022 CLINICAL DATA:  Delirium EXAM: CT HEAD WITHOUT CONTRAST TECHNIQUE: Contiguous axial images were obtained from the base of the skull through the vertex without intravenous contrast. RADIATION DOSE REDUCTION: This exam was performed according to the departmental dose-optimization program which includes automated exposure control, adjustment of the mA and/or kV according to patient size and/or use of iterative reconstruction technique. COMPARISON:  None Available. FINDINGS: Brain: There is atrophy and chronic small vessel disease changes. No acute intracranial abnormality. Specifically, no hemorrhage, hydrocephalus, mass lesion, acute infarction, or significant intracranial injury. Vascular: No hyperdense vessel or unexpected calcification. Skull: No  acute calvarial abnormality. Sinuses/Orbits: No acute findings Other: None IMPRESSION: Atrophy, chronic microvascular disease. No acute intracranial abnormality. Electronically Signed   By: Rolm Baptise M.D.   On: 06/14/2022 01:31    Medications:   apixaban  5 mg Oral BID   aspirin EC  81 mg Oral Daily   atenolol  100 mg Oral QHS   calcium acetate  1,334 mg Oral TID with meals   calcium acetate  667 mg Oral With snacks   Chlorhexidine Gluconate Cloth  6 each Topical Q0600   diltiazem  240 mg Oral Daily   gabapentin  400 mg Oral Q T,Th,Sa-HD   insulin aspart  0-5 Units Subcutaneous QHS   insulin aspart  0-6 Units Subcutaneous TID WC   insulin glargine-yfgn  10 Units Subcutaneous QHS   mometasone-formoterol  2 puff Inhalation BID   multivitamin  1 tablet Oral Daily   pantoprazole  40 mg Oral BID   QUEtiapine  100 mg Oral QHS   rOPINIRole  0.5 mg Oral QHS   rosuvastatin  5 mg Oral QHS   sodium chloride flush  3 mL Intravenous Q12H    Dialysis Orders: Unit: [facility] TTS Schedule/Time: 4:00  EDW: 78.5 kg Flows: 350/500 Bath: 2K/2.5Ca Access: AVF Heparin: 1000 bolus then 1000/hr ESA: Mircera 200 q 2 weeks (last dosed on 7/4) VDRA: None  Assessment/Plan: Melena/Recurrent GIB. S/p EGD with AVMs on last admission. GI consulted.  S/p capsule endoscopy-no signs for bleeding. Per GI, recommend lower dose blood thinners, more frequent miralax, and further w/u if bleeding persist. ESRD -   HD TTS.  Continue on schedule --Hold heparin. On HD. Hypokalemia - K+ 4.0, continue 3K bath for now.  Hypertension/volume  - BP stable. Some volume on exam.  Anemia -Hgb now 7.8 s/p transfusion last admission. Follow trends. Received ESA at OP unit on 7/4.  Metabolic bone disease -  Ca/Phos ok. Continue home meds Permanent AFib - Rate controlled. Eliquis held. Continue Atenolol and Diltiazem. States OP cardiology was planning ablation.  Nutrition - Renal diet with fluid restriction  Dispo-spoke to  Hospitalist, plan for dc tomorrow. Plan for HD 1st shift before discharge. Patient is medically stable from renal standpoint.  Tobie Poet, NP [facility] 06/15/2022,9:33 AM  LOS: 5 days

## 2022-06-15 NOTE — Progress Notes (Addendum)
Physical Therapy Treatment Patient Details Name: [name] MRN: [id number] DOB: 12-04-1951 Today's Date: 06/15/2022   History of Present Illness 71 y.o. female presents to [facility] hospital on 06/08/2022 with chest discomfort and malaise. Pt recently hospitalized 6/27-7/2 with symptomatic anemia 2/2 GIB. Pt with drop in Hgb to 8.3 since previous admission, again admitted for management of acute blood loss anemia. Pt underwent capsule endoscopy on 7/10, findings negative. PMH includes GIB, CAD, heart failure, ESRD on HD, DMII, HTN, HLD.    PT Comments    Pt tolerates treatment well today, ambulating increased distances with more stability compared to yesterday (using same AD). Pt negotiates 2 stairs using rails and no AD (has to stairs to enter home). Pt c/o burning in LE during ambulation, especially in anterior ankles. PT educates and prescribes isometric dorsiflexion holds to build strength and endurance of tibialis anterior. PT will continue to follow-up pending discharge today to progress Pt towards ambulation without an AD (baseline) and to continue improving her activity tolerance.   Recommendations for follow up therapy are one component of a multi-disciplinary discharge planning process, led by the attending physician.  Recommendations may be updated based on patient status, additional functional criteria and insurance authorization.  Follow Up Recommendations  Home health PT     Assistance Recommended at Discharge Intermittent Supervision/Assistance  Patient can return home with the following A little help with walking and/or transfers;A little help with bathing/dressing/bathroom;Assistance with cooking/housework;Help with stairs or ramp for entrance   Equipment Recommendations  None recommended by PT    Recommendations for Other Services       Precautions / Restrictions Precautions Precautions: Fall Restrictions Weight Bearing Restrictions: No     Mobility  Bed  Mobility Overal bed mobility: Modified Independent                  Transfers Overall transfer level: Modified independent Equipment used: Rolling walker (2 wheels) Transfers: Sit to/from Stand Sit to Stand: Modified independent (Device/Increase time)                Ambulation/Gait Ambulation/Gait assistance: Modified independent (Device/Increase time) Gait Distance (Feet): 160 Feet (Additional trial of 75' after practicing stairs and a seated rest break) Assistive device: Rolling walker (2 wheels) Gait Pattern/deviations: Step-through pattern, Decreased stride length Gait velocity: decreased Gait velocity interpretation: <1.8 ft/sec, indicate of risk for recurrent falls   General Gait Details: Pt reports burning in LE during ambulation   Stairs Stairs: Yes Stairs assistance: Min guard Stair Management: One rail Right, Sideways, Two rails, Forwards (One rail sideways going up; two rails forward going down) Number of Stairs: 2     Wheelchair Mobility    Modified Rankin (Stroke Patients Only)       Balance Overall balance assessment: Needs assistance Sitting-balance support: Feet supported, No upper extremity supported Sitting balance-Leahy Scale: Good     Standing balance support: No upper extremity supported Standing balance-Leahy Scale: Fair                              Cognition Arousal/Alertness: Awake/alert Behavior During Therapy: WFL for tasks assessed/performed Overall Cognitive Status: Within Functional Limits for tasks assessed                                          Exercises Other Exercises Other Exercises: Isometric  dorsiflexion holds (PT provides education and has Pt perform 3 reps)    General Comments General comments (skin integrity, edema, etc.): VSS on RA      Pertinent Vitals/Pain Pain Assessment Pain Assessment: No/denies pain    Home Living                          Prior  Function            PT Goals (current goals can now be found in the care plan section) Acute Rehab PT Goals Patient Stated Goal: to regain strength and ability to mobilize independently in the community PT Goal Formulation: With patient Time For Goal Achievement: 06/28/22 Potential to Achieve Goals: Good Progress towards PT goals: Progressing toward goals    Frequency    Min 3X/week      PT Plan Current plan remains appropriate    Co-evaluation              AM-PAC PT "6 Clicks" Mobility   Outcome Measure  Help needed turning from your back to your side while in a flat bed without using bedrails?: None Help needed moving from lying on your back to sitting on the side of a flat bed without using bedrails?: None Help needed moving to and from a bed to a chair (including a wheelchair)?: None Help needed standing up from a chair using your arms (e.g., wheelchair or bedside chair)?: None Help needed to walk in hospital room?: None Help needed climbing 3-5 steps with a railing? : A Little 6 Click Score: 23    End of Session   Activity Tolerance: Patient tolerated treatment well Patient left: in chair;with call bell/phone within reach Nurse Communication: Mobility status PT Visit Diagnosis: Unsteadiness on feet (R26.81);Muscle weakness (generalized) (M62.81)     Time: 7741-4239 PT Time Calculation (min) (ACUTE ONLY): 22 min  Charges:  $Gait Training: 8-22 mins                     Hall Busing, SPT Acute Rehabilitation Office #: [phone]    Hall Busing 06/15/2022, 2:50 PM

## 2022-06-15 NOTE — Progress Notes (Signed)
Pt discharge instructions given, IV and tele removed pt requested to speak with MD, MD notified and spoke with pt.

## 2022-06-15 NOTE — Progress Notes (Signed)
Mobility Specialist Progress Note:   06/15/22 0942  Mobility  Activity Off unit   Pt in HD will follow-up as time allows.   [facility] Chasen Mendell Mobility Specialist

## 2022-06-15 NOTE — Discharge Summary (Signed)
[name] IRW:[id number] DOB: 01-04-1951 DOA: 06/09/2022  PCP: Tamara Fraise, MD  Admit date: 06/09/2022  Discharge date: 06/15/2022  Admitted From: Home   disposition: Home with home health PT and RN   Recommendations for Outpatient Follow-up:   Follow up with PCP in 1-2 weeks  PCP Please obtain CBC 1week,  (see Discharge instructions)     Home Health: PT, RN Equipment/Devices: Walker Consultations: Nephrologyn gastroenterology, cardiology Discharge Condition: Improved CODE STATUS: Full Diet Recommendation: Heart Healthy carb modified, renal,  Diet Order             Diet Carb Modified           Diet renal with fluid restriction Fluid restriction: 1500 mL Fluid; Room service appropriate? Yes; Fluid consistency: Thin  Diet effective now                    Chief Complaint  Patient presents with   Palpitations   Weakness     Brief history of present illness from the day of admission and additional interim summary     [name] is a 71 y.o. female with medical history significant of  CHF with grade 2 diastolic dysfunction, anemia, coronary artery disease, end-stage renal disease with dialysis Tuesday Thursday Saturday, COPD, diabetic neuropathy, GERD, hyperlipidemia, hypertension, insulin-dependent diabetes, stroke, and peripheral vascular disease presented with complaints of chest discomfort and malaise.  She reports that she has been in atrial fibrillation since April.  She just recently hospitalized for symptomatic anemia secondary to GI bleed from 6/27-7/2.  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  She was resumed on Eliquis, Brilinta, and PPI twice daily.  Since  getting home patient reported that she had not felt better.  She has felt her heart racing which causes her to have some chest pressure, some lower abdominal pain, and continued black stools.  She has been compliant with hemodialysis and has not missed any treatments.  She has been taking all of her medications as prescribed.     On admission into the emergency department patient was noted to be afebrile, pulse 80-1 03, respirations 13-22, blood pressures 99/61 to 167/68, and O2 saturations currently maintained on 2 L of nasal cannula oxygen.  Labs from 7/7 revealed hemoglobin 8.3, platelets 493, potassium 3.2, BUN 38, creatinine 6.15, glucose 224, and HS troponin 33->46.  Chest x-ray revealed increased atelectasis in the left lung base, cardiomegaly with unchanged central vascular congestion.  Stool guaiacs were noted to be positive.  GI have been consulted and patient was started on Protonix 40 mg IV twice daily  Hospital Course   Patient's GI bleed was thought to be secondary to initiation of anticoagulation after recent endoscopy which did show AVMs in her duodenum.  Upon admission, patient's Eliquis was held.  Cardiology consulted who noted that since PCI/DES was done in February, it was reasonable to discontinue Brilinta and substitute aspirin.  Patient underwent capsule endoscopy which did not show any acute bleeding.  Patient was observed with serial hemoglobin hematocrit which remained stable.  Patient was reinitiated on Eliquis and aspirin and was able to maintain her H&H without further bleeding.  Patient was seen by nephrology throughout her stay and underwent hemodialysis as warranted.  Day prior to admission patient was noted to have an episode of significant somnolence with difficulty waking up.  Evaluation including CBG, ABG head CT and pneumonia are all within normal limits.  The following morning patient was awake alert and  oriented x4 and did not know why she was so sleepy although she did admit to ongoing and persistent insomnia.  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 Of note patient is on multiple medications which can cause somnolence including Seroquel, gabapentin and ropinirole   ESRD on HD Patient to undergo dialysis tomorrow and then be discharged.   Permanent atrial fibrillation Atenolol and diltiazem for rate control Eliquis restarted yesterday   CAD S/p recent arthrectomy/DES on April 2023 Per cardiology, okay to [place] and start aspirin Aspirin started yesterday   DM 2 Continue present management   HTN Continue atenolol and diltiazem   Anxiety and depression/insomnia Continue quetiapine and ropinirole   HFpEF Euvolemic at present, fluid managed by HD    Discharge diagnosis     Active Problems:   Acute blood loss anemia   ESRD (end stage renal disease) on dialysis ([place])   Permanent atrial fibrillation (HCC)   CAD S/P percutaneous coronary angioplasty   Type 2 diabetes mellitus with hyperlipidemia ([place])   Heart failure with preserved ejection fraction [facility])   Essential hypertension   Anxiety    Discharge instructions    Discharge Instructions     Call MD for:  persistant dizziness or light-headedness   Complete by: As directed    Diet Carb Modified   Complete by: As directed    Discharge instructions   Complete by: As directed    1.  Please make sure you understand all your medication changes.  In particular stop taking  Brilinta.  Take aspirin instead.  Continue taking Eliquis. 2.  If you start to have black or red stools or think you are bleeding again, you need to be seen by an MD immediately.   Increase activity slowly   Complete by: As directed    No wound care   Complete by: As directed        Discharge Medications   Allergies as of 06/15/2022       Reactions   Azithromycin    Prolonged QT   Ace Inhibitors Cough   Clopidogrel Bisulfate Other (See Comments)   Sick, Headache, "Felt terrible"   Codeine Other (See Comments)   hallucinations  Lisinopril Cough   Penicillins Other (See Comments)   Unknown childhood reaction Did it involve swelling of the face/tongue/throat, SOB, or low BP? Unknown Did it involve sudden or severe rash/hives, skin peeling, or any reaction on the inside of your mouth or nose? Unknown Did you need to seek medical attention at a hospital or doctor's office? Unknown When did it last happen?      childhood allergy If all above answers are "NO", may proceed with cephalosporin use.        Medication List     STOP taking these medications    hydrOXYzine 50 MG tablet Commonly known as: ATARAX   omeprazole 20 MG capsule Commonly known as: PRILOSEC   QUEtiapine 200 MG tablet Commonly known as: SEROQUEL   ticagrelor 90 MG Tabs tablet Commonly known as: BRILINTA       TAKE these medications    acetaminophen 500 MG tablet Commonly known as: TYLENOL Take 1,000 mg by mouth every 6 (six) hours as needed for moderate pain or headache.   albuterol (2.5 MG/3ML) 0.083% nebulizer solution Commonly known as: PROVENTIL Take 3 mLs (2.5 mg total) by nebulization every 6 (six) hours as needed for wheezing or shortness of breath. What changed: Another medication with the same name was removed. Continue taking this medication, and follow the directions you see here.   apixaban 5 MG Tabs tablet Commonly known as: ELIQUIS Take 1 tablet (5 mg total) by mouth 2 (two)  times daily.   aspirin EC 81 MG tablet Take 1 tablet (81 mg total) by mouth daily. Swallow whole.   atenolol 100 MG tablet Commonly known as: TENORMIN Take 1 tablet (100 mg total) by mouth at bedtime.   budesonide-formoterol 160-4.5 MCG/ACT inhaler Commonly known as: SYMBICORT Inhale 2 puffs into the lungs 2 (two) times daily.   calcium acetate 667 MG capsule Commonly known as: PHOSLO Take 667-1,334 mg by mouth See admin instructions. Take 1334 mg with meals 3 times daily and 667 mg with snacks   cetirizine 10 MG tablet Commonly known as: ZYRTEC Take 1 tablet (10 mg total) by mouth daily as needed for allergies. What changed:  when to take this reasons to take this   diltiazem 240 MG 24 hr capsule Commonly known as: CARDIZEM CD Take 1 capsule (240 mg total) by mouth daily.   FreeStyle Libre 2 Reader Kerrin Mo USE TO TEST BLOOD SUGAR CONTINUOUSLY   FreeStyle Libre 2 Sensor Misc Use multiple times daily to track glucose to prevent highs and lows as a complication of dialysis Dx:Z99.2, E11.59   gabapentin 400 MG capsule Commonly known as: NEURONTIN Take 2 capsules (800 mg total) by mouth See admin instructions. Take it after HD What changed: additional instructions   lidocaine-prilocaine cream Commonly known as: EMLA Apply 1 application. topically as needed (dialysis).   LUBRICATING EYE DROPS OP Place 1 drop into both eyes daily as needed (dry eyes).   multivitamin Tabs tablet Take 1 tablet by mouth daily.   nitroGLYCERIN 0.4 MG SL tablet Commonly known as: NITROSTAT Place 1 tablet (0.4 mg total) under the tongue every 5 (five) minutes as needed for chest pain.   NovoLOG FlexPen 100 UNIT/ML FlexPen Generic drug: insulin aspart Inject 1 Units into the skin 3 (three) times daily with meals. < 150: 0 units. 150-199:1 unit. 200-249: 2 units. 250-299: 3 units. 300-349: 4 units. Over 350: 5 units   ondansetron 4 MG tablet Commonly known as: ZOFRAN TAKE 1 TABLET BY MOUTH  EVERY  8 HOURS AS NEEDED FOR NAUSEA AND VOMITING What changed: See the new instructions.   OneTouch Ultra test strip Generic drug: glucose blood Use to test blood sugar 3 times daily as directed. DX: E11.9   Ozempic (0.25 or 0.5 MG/DOSE) 2 MG/1.5ML Sopn Generic drug: Semaglutide(0.25 or 0.5MG /DOS) Inject 0.5 mg into the skin every Friday.   pantoprazole 40 MG tablet Commonly known as: PROTONIX Take 1 tablet (40 mg total) by mouth 2 (two) times daily.   polyethylene glycol 17 g packet Commonly known as: MIRALAX / GLYCOLAX Take 17 g by mouth daily as needed for moderate constipation or mild constipation.   rOPINIRole 0.5 MG tablet Commonly known as: REQUIP Take 1 tablet (0.5 mg total) by mouth at bedtime. What changed:  medication strength how much to take additional instructions   rosuvastatin 5 MG tablet Commonly known as: Crestor Take 1 tablet (5 mg total) by mouth daily. For cholesterol What changed: when to take this   [place] FlexTouch 100 UNIT/ML FlexTouch Pen Generic drug: insulin degludec Inject 20 Units into the skin at bedtime. Via [employer] patient assistance program What changed: how much to take               Durable Medical Equipment  (From admission, onward)           Start     Ordered   06/15/22 1520  For home use only DME oxygen  Once       Question Answer Comment  Length of Need Lifetime   Mode or (Route) Nasal cannula   Liters per Minute 2   Frequency Only at night (stationary unit needed)   Oxygen conserving device Yes   Oxygen delivery system Gas      06/15/22 1520             Follow-up Information     [facility], [facility] Follow up.   Specialty: Home Health Services Why: [place] will contact you to coordinate apt times Contact information: [address] [address] [phone]                 Major procedures and Radiology Reports - PLEASE review detailed and final  reports thoroughly  -      CT HEAD WO CONTRAST (5MM)  Result Date: 06/14/2022 CLINICAL DATA:  Delirium EXAM: CT HEAD WITHOUT CONTRAST TECHNIQUE: Contiguous axial images were obtained from the base of the skull through the vertex without intravenous contrast. RADIATION DOSE REDUCTION: This exam was performed according to the departmental dose-optimization program which includes automated exposure control, adjustment of the mA and/or kV according to patient size and/or use of iterative reconstruction technique. COMPARISON:  None Available. FINDINGS: Brain: There is atrophy and chronic small vessel disease changes. No acute intracranial abnormality. Specifically, no hemorrhage, hydrocephalus, mass lesion, acute infarction, or significant intracranial injury. Vascular: No hyperdense vessel or unexpected calcification. Skull: No acute calvarial abnormality. Sinuses/Orbits: No acute findings Other: None IMPRESSION: Atrophy, chronic microvascular disease. No acute intracranial abnormality. Electronically Signed   By: Rolm Baptise M.D.   On: 06/14/2022 01:31   DG Chest Portable 1 View  Result Date: 06/09/2022 CLINICAL DATA:  Worsening shortness of breath. EXAM: PORTABLE CHEST 1 VIEW COMPARISON:  Radiograph yesterday.  CT 05/15/2022 FINDINGS: Cardiomegaly. Similar central vascular congestion allowing for differences in technique. No convincing pulmonary edema. Increasing atelectasis at the left lung base. No large pleural effusion or pneumothorax. Stable osseous structures. IMPRESSION: 1. Increasing atelectasis at the  left lung base. 2. Cardiomegaly and central vascular congestion, unchanged. Electronically Signed   By: Keith Rake M.D.   On: 06/09/2022 21:02   DG Chest 2 View  Result Date: 06/08/2022 CLINICAL DATA:  Shortness of breath EXAM: CHEST - 2 VIEW COMPARISON:  05/30/2022 FINDINGS: Unchanged mild cardiomegaly with pulmonary vascular congestion. No overt pulmonary edema or focal airspace  consolidation. No pleural effusion. IMPRESSION: Cardiomegaly and pulmonary vascular congestion without overt pulmonary edema. Electronically Signed   By: Ulyses Jarred M.D.   On: 06/08/2022 19:32   DG Abd 2 Views  Result Date: 06/01/2022 CLINICAL DATA:  Nausea and vomiting. EXAM: ABDOMEN - 2 VIEW COMPARISON:  Abdominal x-ray 07/11/2017 CTA abdomen and pelvis 10/01/2020. FINDINGS: The bowel gas pattern is normal. There is no evidence of free air. Prominent vascular calcifications are present in the abdomen and pelvis as well as lower extremities. Branching calcifications are seen in the region of the kidneys, likely vascular as seen on comparison CT. There are phleboliths in the pelvis. Degenerative changes affect the lower lumbar spine and hips. IMPRESSION: 1. Nonobstructive bowel gas pattern. 2. Severe atherosclerotic disease including peripheral vascular calcifications. Electronically Signed   By: Ronney Asters M.D.   On: 06/01/2022 18:18   DG Chest 2 View  Result Date: 05/30/2022 CLINICAL DATA:  Chest pain EXAM: CHEST - 2 VIEW COMPARISON:  04/25/2022, 09/15/2020, 03/04/2022 FINDINGS: Cardiomegaly with mild central congestion. Mild diffuse interstitial opacity without significant change. Aortic atherosclerosis. No pneumothorax. Hardware in the cervical spine. IMPRESSION: Cardiomegaly with slight central congestion. Mild diffuse interstitial opacity without focal airspace disease. Electronically Signed   By: Donavan Foil M.D.   On: 05/30/2022 17:48    Micro Results    No results found for this or any previous visit (from the past 240 hour(s)).  Today   Subjective    [name] feels much improved since admission.  Feels ready to go home.  Denies chest pain, shortness of breath or abdominal pain.  Feels they can take care of themselves with the resources they have at home.  Objective   Blood pressure (!) 102/50, pulse 92, temperature 98.9 F (37.2 C), temperature source Oral, resp. rate  19, height 5\' 3"  (1.6 m), weight 77.5 kg, SpO2 95 %.   Intake/Output Summary (Last 24 hours) at 06/15/2022 1833 Last data filed at 06/15/2022 1148 Gross per 24 hour  Intake 243 ml  Output 1 ml  Net 242 ml    Exam General: Patient appears well and in good spirits sitting up in bed in no acute distress.  Eyes: sclera anicteric, conjuctiva mild injection bilaterally CVS: S1-S2, regular  Respiratory:  decreased air entry bilaterally secondary to decreased inspiratory effort, rales at bases  GI: NABS, soft, NT  LE: No edema.  Neuro: A/O x 3, Moving all extremities equally with normal strength, CN 3-12 intact, grossly nonfocal.  Psych: patient is logical and coherent, judgement and insight appear normal, mood and affect appropriate to situation.    Data Review   CBC w Diff:  Lab Results  Component Value Date   WBC 11.8 (H) 06/15/2022   HGB 7.8 (L) 06/15/2022   HGB 10.0 (L) 04/19/2022   HCT 24.6 (L) 06/15/2022   HCT 31.9 (L) 04/19/2022   PLT 425 (H) 06/15/2022   PLT 300 04/19/2022   LYMPHOPCT 11 06/09/2022   MONOPCT 11 06/09/2022   EOSPCT 3 06/09/2022   BASOPCT 1 06/09/2022    CMP:  Lab Results  Component Value Date  NA 128 (L) 06/15/2022   NA 137 04/19/2022   K 4.0 06/15/2022   CL 91 (L) 06/15/2022   CO2 25 06/15/2022   BUN 29 (H) 06/15/2022   BUN 24 04/19/2022   CREATININE 7.52 (H) 06/15/2022   CREATININE 1.66 (H) 03/25/2013   PROT 5.9 (L) 06/09/2022   PROT 5.9 (L) 03/17/2022   ALBUMIN 2.2 (L) 06/15/2022   ALBUMIN 4.0 03/17/2022   BILITOT 0.4 06/09/2022   BILITOT 0.3 03/17/2022   ALKPHOS 75 06/09/2022   AST 16 06/09/2022   ALT 36 06/09/2022  .   Total Time in preparing paper work, data evaluation and todays exam - 35 minutes  Vashti Hey M.D on 06/15/2022 at 6:33 PM  [facility]

## 2022-06-15 NOTE — Progress Notes (Signed)
D/C order noted. Contacted [facility] and spoke to [place]. Advised clinic that pt will d/c today and resume care on Saturday. Will fax d/c summary and last renal note to clinic once d/c summary completed. According to [facility] notes, pt's clinic resumed transportation for Saturday HD appt so pt did not have transportation to HD appt today.   Melven Sartorius Renal Navigator [phone]

## 2022-06-15 NOTE — Telephone Encounter (Signed)
Patient still admitted.

## 2022-06-16 NOTE — Progress Notes (Signed)
Late Entry Note:  D/C summary and last renal note faxed to [facility] this am for continuation of care.  Melven Sartorius Renal Navigator [phone]

## 2022-06-17 DIAGNOSIS — G4733 Obstructive sleep apnea (adult) (pediatric): Secondary | ICD-10-CM | POA: Diagnosis not present

## 2022-06-17 DIAGNOSIS — D631 Anemia in chronic kidney disease: Secondary | ICD-10-CM | POA: Diagnosis not present

## 2022-06-17 DIAGNOSIS — E1140 Type 2 diabetes mellitus with diabetic neuropathy, unspecified: Secondary | ICD-10-CM | POA: Diagnosis not present

## 2022-06-17 DIAGNOSIS — I4821 Permanent atrial fibrillation: Secondary | ICD-10-CM | POA: Diagnosis not present

## 2022-06-17 DIAGNOSIS — E1159 Type 2 diabetes mellitus with other circulatory complications: Secondary | ICD-10-CM | POA: Diagnosis not present

## 2022-06-17 DIAGNOSIS — K5790 Diverticulosis of intestine, part unspecified, without perforation or abscess without bleeding: Secondary | ICD-10-CM | POA: Diagnosis not present

## 2022-06-17 DIAGNOSIS — D62 Acute posthemorrhagic anemia: Secondary | ICD-10-CM | POA: Diagnosis not present

## 2022-06-17 DIAGNOSIS — F32A Depression, unspecified: Secondary | ICD-10-CM | POA: Diagnosis not present

## 2022-06-17 DIAGNOSIS — N250 Renal osteodystrophy: Secondary | ICD-10-CM | POA: Diagnosis not present

## 2022-06-17 DIAGNOSIS — M19012 Primary osteoarthritis, left shoulder: Secondary | ICD-10-CM | POA: Diagnosis not present

## 2022-06-17 DIAGNOSIS — E559 Vitamin D deficiency, unspecified: Secondary | ICD-10-CM | POA: Diagnosis not present

## 2022-06-17 DIAGNOSIS — N2581 Secondary hyperparathyroidism of renal origin: Secondary | ICD-10-CM | POA: Diagnosis not present

## 2022-06-17 DIAGNOSIS — F411 Generalized anxiety disorder: Secondary | ICD-10-CM | POA: Diagnosis not present

## 2022-06-17 DIAGNOSIS — E1122 Type 2 diabetes mellitus with diabetic chronic kidney disease: Secondary | ICD-10-CM | POA: Diagnosis not present

## 2022-06-17 DIAGNOSIS — I152 Hypertension secondary to endocrine disorders: Secondary | ICD-10-CM | POA: Diagnosis not present

## 2022-06-17 DIAGNOSIS — I700 Atherosclerosis of aorta: Secondary | ICD-10-CM | POA: Diagnosis not present

## 2022-06-17 DIAGNOSIS — D509 Iron deficiency anemia, unspecified: Secondary | ICD-10-CM | POA: Diagnosis not present

## 2022-06-17 DIAGNOSIS — Z992 Dependence on renal dialysis: Secondary | ICD-10-CM | POA: Diagnosis not present

## 2022-06-17 DIAGNOSIS — G8929 Other chronic pain: Secondary | ICD-10-CM | POA: Diagnosis not present

## 2022-06-17 DIAGNOSIS — I272 Pulmonary hypertension, unspecified: Secondary | ICD-10-CM | POA: Diagnosis not present

## 2022-06-17 DIAGNOSIS — M542 Cervicalgia: Secondary | ICD-10-CM | POA: Diagnosis not present

## 2022-06-17 DIAGNOSIS — M19011 Primary osteoarthritis, right shoulder: Secondary | ICD-10-CM | POA: Diagnosis not present

## 2022-06-17 DIAGNOSIS — K222 Esophageal obstruction: Secondary | ICD-10-CM | POA: Diagnosis not present

## 2022-06-17 DIAGNOSIS — J449 Chronic obstructive pulmonary disease, unspecified: Secondary | ICD-10-CM | POA: Diagnosis not present

## 2022-06-17 DIAGNOSIS — N186 End stage renal disease: Secondary | ICD-10-CM | POA: Diagnosis not present

## 2022-06-17 DIAGNOSIS — E782 Mixed hyperlipidemia: Secondary | ICD-10-CM | POA: Diagnosis not present

## 2022-06-17 DIAGNOSIS — K219 Gastro-esophageal reflux disease without esophagitis: Secondary | ICD-10-CM | POA: Diagnosis not present

## 2022-06-17 DIAGNOSIS — E1151 Type 2 diabetes mellitus with diabetic peripheral angiopathy without gangrene: Secondary | ICD-10-CM | POA: Diagnosis not present

## 2022-06-17 DIAGNOSIS — I5033 Acute on chronic diastolic (congestive) heart failure: Secondary | ICD-10-CM | POA: Diagnosis not present

## 2022-06-19 DIAGNOSIS — E1140 Type 2 diabetes mellitus with diabetic neuropathy, unspecified: Secondary | ICD-10-CM | POA: Diagnosis not present

## 2022-06-19 DIAGNOSIS — I4821 Permanent atrial fibrillation: Secondary | ICD-10-CM | POA: Diagnosis not present

## 2022-06-19 DIAGNOSIS — D62 Acute posthemorrhagic anemia: Secondary | ICD-10-CM | POA: Diagnosis not present

## 2022-06-19 DIAGNOSIS — I152 Hypertension secondary to endocrine disorders: Secondary | ICD-10-CM | POA: Diagnosis not present

## 2022-06-19 DIAGNOSIS — I5033 Acute on chronic diastolic (congestive) heart failure: Secondary | ICD-10-CM | POA: Diagnosis not present

## 2022-06-19 DIAGNOSIS — E1159 Type 2 diabetes mellitus with other circulatory complications: Secondary | ICD-10-CM | POA: Diagnosis not present

## 2022-06-19 NOTE — Telephone Encounter (Signed)
Patient contacted regarding discharge from [facility] on 06/15/22.  Patient understands to follow up with provider [place] on 06/26/22 at 10:05 am at [facility]. Patient understands discharge instructions? yes Patient understands medications and regiment? yes Patient understands to bring all medications to this visit? yes  Ask patient:  Are you enrolled in My Chart (yes or no)  If no ask patient if they would like to enroll.    Patient does report swelling in her feet and legs. She is weak and having to use a walker. She is using her oxygen at night and that is doing fine. She is not sure who brought the oxygen, number for [facility] given to the patient at her request. Told her I was not sure if they brought it or not.

## 2022-06-20 DIAGNOSIS — N2581 Secondary hyperparathyroidism of renal origin: Secondary | ICD-10-CM | POA: Diagnosis not present

## 2022-06-20 DIAGNOSIS — N186 End stage renal disease: Secondary | ICD-10-CM | POA: Diagnosis not present

## 2022-06-20 DIAGNOSIS — Z992 Dependence on renal dialysis: Secondary | ICD-10-CM | POA: Diagnosis not present

## 2022-06-20 DIAGNOSIS — D631 Anemia in chronic kidney disease: Secondary | ICD-10-CM | POA: Diagnosis not present

## 2022-06-20 DIAGNOSIS — D509 Iron deficiency anemia, unspecified: Secondary | ICD-10-CM | POA: Diagnosis not present

## 2022-06-20 DIAGNOSIS — N250 Renal osteodystrophy: Secondary | ICD-10-CM | POA: Diagnosis not present

## 2022-06-21 ENCOUNTER — Telehealth: Payer: Self-pay

## 2022-06-21 DIAGNOSIS — D62 Acute posthemorrhagic anemia: Secondary | ICD-10-CM | POA: Diagnosis not present

## 2022-06-21 DIAGNOSIS — I5033 Acute on chronic diastolic (congestive) heart failure: Secondary | ICD-10-CM | POA: Diagnosis not present

## 2022-06-21 DIAGNOSIS — I152 Hypertension secondary to endocrine disorders: Secondary | ICD-10-CM | POA: Diagnosis not present

## 2022-06-21 DIAGNOSIS — I4821 Permanent atrial fibrillation: Secondary | ICD-10-CM | POA: Diagnosis not present

## 2022-06-21 DIAGNOSIS — E1159 Type 2 diabetes mellitus with other circulatory complications: Secondary | ICD-10-CM | POA: Diagnosis not present

## 2022-06-21 DIAGNOSIS — E1140 Type 2 diabetes mellitus with diabetic neuropathy, unspecified: Secondary | ICD-10-CM | POA: Diagnosis not present

## 2022-06-21 NOTE — Chronic Care Management (AMB) (Signed)
  Chronic Care Management Note  06/21/2022 Name: [name] MRN: [id number] DOB: [birth year]  LEANNE SISLER is a 71 y.o. year old female who is a primary care patient of [name], [name], MD and is actively engaged with the care management team. I reached out to [name] by phone today to assist with re-scheduling a follow up visit with the RN Case Manager  Follow up plan: Unsuccessful telephone outreach attempt made. A HIPAA compliant phone message was left for the patient providing contact information and requesting a return call.  The care management team will reach out to the patient again over the next 7 days.  If patient returns call to provider office, please advise to call [facility]  at [place], [place], [place] [address] Direct Dial: [phone] [email or web address]

## 2022-06-22 ENCOUNTER — Ambulatory Visit: Payer: Medicare Other | Admitting: Physician Assistant

## 2022-06-22 DIAGNOSIS — N186 End stage renal disease: Secondary | ICD-10-CM | POA: Diagnosis not present

## 2022-06-22 DIAGNOSIS — N2581 Secondary hyperparathyroidism of renal origin: Secondary | ICD-10-CM | POA: Diagnosis not present

## 2022-06-22 DIAGNOSIS — Z992 Dependence on renal dialysis: Secondary | ICD-10-CM | POA: Diagnosis not present

## 2022-06-22 DIAGNOSIS — D631 Anemia in chronic kidney disease: Secondary | ICD-10-CM | POA: Diagnosis not present

## 2022-06-22 DIAGNOSIS — D509 Iron deficiency anemia, unspecified: Secondary | ICD-10-CM | POA: Diagnosis not present

## 2022-06-22 DIAGNOSIS — N250 Renal osteodystrophy: Secondary | ICD-10-CM | POA: Diagnosis not present

## 2022-06-23 ENCOUNTER — Telehealth: Payer: Medicare Other

## 2022-06-23 DIAGNOSIS — E1159 Type 2 diabetes mellitus with other circulatory complications: Secondary | ICD-10-CM | POA: Diagnosis not present

## 2022-06-23 DIAGNOSIS — I5033 Acute on chronic diastolic (congestive) heart failure: Secondary | ICD-10-CM | POA: Diagnosis not present

## 2022-06-23 DIAGNOSIS — I4821 Permanent atrial fibrillation: Secondary | ICD-10-CM | POA: Diagnosis not present

## 2022-06-23 DIAGNOSIS — I152 Hypertension secondary to endocrine disorders: Secondary | ICD-10-CM | POA: Diagnosis not present

## 2022-06-23 DIAGNOSIS — D62 Acute posthemorrhagic anemia: Secondary | ICD-10-CM | POA: Diagnosis not present

## 2022-06-23 DIAGNOSIS — E1140 Type 2 diabetes mellitus with diabetic neuropathy, unspecified: Secondary | ICD-10-CM | POA: Diagnosis not present

## 2022-06-24 DIAGNOSIS — D631 Anemia in chronic kidney disease: Secondary | ICD-10-CM | POA: Diagnosis not present

## 2022-06-24 DIAGNOSIS — N2581 Secondary hyperparathyroidism of renal origin: Secondary | ICD-10-CM | POA: Diagnosis not present

## 2022-06-24 DIAGNOSIS — N186 End stage renal disease: Secondary | ICD-10-CM | POA: Diagnosis not present

## 2022-06-24 DIAGNOSIS — Z992 Dependence on renal dialysis: Secondary | ICD-10-CM | POA: Diagnosis not present

## 2022-06-24 DIAGNOSIS — N250 Renal osteodystrophy: Secondary | ICD-10-CM | POA: Diagnosis not present

## 2022-06-24 DIAGNOSIS — D509 Iron deficiency anemia, unspecified: Secondary | ICD-10-CM | POA: Diagnosis not present

## 2022-06-24 NOTE — Progress Notes (Unsigned)
Cardiology Clinic Note   Patient Name: [name] Date of Encounter: 06/26/2022  Primary Care Provider:  [name], MD Primary Cardiologist:  Minus Breeding, MD  Patient Profile    [name] 71 year old female presents the clinic today for follow-up evaluation of her chest discomfort.  Past Medical History    Past Medical History:  Diagnosis Date   Acute on chronic diastolic CHF (congestive heart failure) ([place]) 03/05/2015   Grade 2. EF 60-65%   Anemia    hx of   Anxiety    Arthritis    CAD (coronary artery disease)    s/p Stenting in 2005;  [place] 7/09: Vigorous LV function, 2-3+ MR, RI 40%, RI stent patent, proximal-mid RCA 40-50%;  Echo 7/09:  EF 55-65%, trivial MR;  [place]ov[place] 11/18/12: EF 66%, normal LV wall motion, mild to moderate anterior ischemia - reviewed by [facility] and felt to rep breast atten (low risk);  Echo 6/14: mild LVH, EF 55-65%, Gr 2 DD, PASP 44, trivial eff    Chronic kidney disease    CREATININE IS UP--GOING TO KIDNEY MD    Chronic neck pain    COPD (chronic obstructive pulmonary disease) (HCC)    Degenerative joint disease    left shoulder   Diabetic neuropathy (HCC)    Diverticulosis    Esophageal stricture    GERD (gastroesophageal reflux disease)    Hyperlipidemia    Hypertension    IDDM (insulin dependent diabetes mellitus)    Pericardial effusion 03/06/2015   Very small per echo ; pleural nodules seen on CT chest.   Peripheral vascular disease ([place])    Stroke ([place])    "mini- years ago"   Vitamin D deficiency    Past Surgical History:  Procedure Laterality Date   A/V FISTULAGRAM N/A 08/10/2017   Procedure: A/V Fistulagram - Right Arm;  Surgeon: Elam Dutch, MD;  Location: [place] [facility];  Service: Cardiovascular;  Laterality: N/A;   A/V FISTULAGRAM N/A 03/03/2020   Procedure: A/V FISTULAGRAM - Right Arm;  Surgeon: Marty Heck, MD;  Location: [place] [facility];  Service: Cardiovascular;  Laterality: N/A;   ABDOMINAL  HYSTERECTOMY     AV FISTULA PLACEMENT Right 07/20/2015   Procedure: RADIAL CEPHALIC ARTERIOVENOUS FISTULA CREATION RIGHT ARM;  Surgeon: Angelia Mould, MD;  Location: [place];  Service: Vascular;  Laterality: Right;   AV FISTULA PLACEMENT Right 11/26/2015   Procedure:  Creation of RIGHT BRACHIO-CEPHALIC arteriovenous fistula;  Surgeon: Angelia Mould, MD;  Location[facility] OR;  Service: Vascular;  Laterality: Right;   BACK SURGERY     2007 & 2013   [place]  2003, 2005, 2009   1 stent   cardiac stents     CARDIOVERSION N/A 04/24/2022   Procedure: CARDIOVERSION;  Surgeon: Fay Records, MD;  Location: [facility] ENDOSCOPY;  Service: Cardiovascular;  Laterality: N/A;   CATARACT EXTRACTION W/PHACO Right 05/04/2014   Procedure: CATARACT EXTRACTION PHACO AND INTRAOCULAR LENS PLACEMENT ([place]);  Surgeon: Williams Che, MD;  Location: [facility];  Service: Ophthalmology;  Laterality: Right;  CDE 1.47   CATARACT EXTRACTION W/PHACO Left 07/20/2014   Procedure: CATARACT EXTRACTION WITH PHACO AND INTRAOCULAR LENS PLACEMENT LEFT EYE CDE=1.79;  Surgeon: Williams Che, MD;  Location: [facility];  Service: Ophthalmology;  Laterality: Left;   CERVICAL SPINE SURGERY  2012   8 screws   CORONARY ATHERECTOMY N/A 03/08/2022   Procedure: CORONARY  ATHERECTOMY;  Surgeon: Sherren Mocha, MD;  Location: [place] [facility];  Service: Cardiovascular;  Laterality: N/A;   CYSTOSCOPY WITH INJECTION N/A 06/03/2013   Procedure: MACROPLASTIQUE  INJECTION ;  Surgeon: Malka So, MD;  Location: [facility];  Service: Urology;  Laterality: N/A;   ESOPHAGEAL DILATION Right 06/02/2022   Procedure: ESOPHAGEAL DILATION;  Surgeon: Harvel Quale, MD;  [facility];  Service: Gastroenterology;  Laterality: Right;   ESOPHAGOGASTRODUODENOSCOPY (EGD) WITH PROPOFOL N/A 06/02/2022   Procedure: ESOPHAGOGASTRODUODENOSCOPY (EGD) WITH PROPOFOL;  Surgeon: Harvel Quale, MD;   [facility];  Service: Gastroenterology;  Laterality: N/A;  TO BE DONE IN OR 2   FISTULOGRAM Right 10/20/2016   Procedure: FISTULOGRAM WITH POSSIBLE INTERVENTION;  Surgeon: [name], MD;  Location: [facility];  Service: Vascular;  Laterality: Right;   GIVENS CAPSULE STUDY N/A 06/11/2022   Procedure: GIVENS CAPSULE STUDY;  Surgeon: Clarene Essex, MD;  Location: [place];  Service: Gastroenterology;  Laterality: N/A;   HEMOSTASIS CLIP PLACEMENT  06/02/2022   Procedure: HEMOSTASIS CLIP PLACEMENT;  Surgeon: Harvel Quale, MD;  [facility];  Service: Gastroenterology;;   HOT HEMOSTASIS  06/02/2022   Procedure: HOT HEMOSTASIS (ARGON PLASMA COAGULATION/BICAP);  Surgeon: Montez Morita, Quillian Quince, MD;  [facility];  Service: Gastroenterology;;   INSERTION OF DIALYSIS CATHETER N/A 11/26/2015   Procedure: INSERTION OF DIALYSIS CATHETER;  Surgeon: Angelia Mould, MD;  Location: [place];  Service: Vascular;  Laterality: N/A;   INTRAVASCULAR IMAGING/OCT N/A 03/08/2022   Procedure: INTRAVASCULAR IMAGING/OCT;  Surgeon: Sherren Mocha, MD;  Location: [place] [facility];  Service: Cardiovascular;  Laterality: N/A;   LAMINECTOMY  12/29/2011   LEFT HEART CATH AND CORONARY ANGIOGRAPHY N/A 03/06/2022   Procedure: LEFT HEART CATH AND CORONARY ANGIOGRAPHY;  Surgeon: Nelva Bush, MD;  Location: [place] [facility];  Service: Cardiovascular;  Laterality: N/A;   LUMBAR LAMINECTOMY/DECOMPRESSION MICRODISCECTOMY  12/29/2011   Procedure: LUMBAR LAMINECTOMY/DECOMPRESSION MICRODISCECTOMY;  Surgeon: Floyce Stakes, MD;  Location: [place] NEUR[facility] ORS;  Service: Neurosurgery;  Laterality: N/A;  Lumbar Two through Lumbar Five Laminectomies/Cellsaver   PARTIAL HYSTERECTOMY     PERIPHERAL VASCULAR BALLOON ANGIOPLASTY  03/03/2020   Procedure: PERIPHERAL VASCULAR BALLOON ANGIOPLASTY;  Surgeon: Marty Heck, MD;  Location: [place] [facility];  Service: Cardiovascular;;  Rt arm  fistula   PERIPHERAL VASCULAR BALLOON ANGIOPLASTY Right 04/15/2021   Procedure: PERIPHERAL VASCULAR BALLOON ANGIOPLASTY;  Surgeon: Angelia Mould, MD;  Location: [place] [facility];  Service: Cardiovascular;  Laterality: Right;  arm fistula   PERIPHERAL VASCULAR BALLOON ANGIOPLASTY  03/14/2022   Procedure: PERIPHERAL VASCULAR BALLOON ANGIOPLASTY;  Surgeon: Broadus John, MD;  Location: [place] [facility];  Service: Cardiovascular;;  right arm AVF   PERIPHERAL VASCULAR CATHETERIZATION N/A 11/22/2015   Procedure: Fistulagram;  Surgeon: Angelia Mould, MD;  Location: [place] [facility];  Service: Cardiovascular;  Laterality: N/A;   PERIPHERAL VASCULAR INTERVENTION  08/10/2017   Procedure: PERIPHERAL VASCULAR INTERVENTION;  Surgeon: Elam Dutch, MD;  Location: [place] [facility];  Service: Cardiovascular;;   SHOULDER SURGERY Right    Rotator cuff    Allergies  Allergies  Allergen Reactions   Azithromycin     Prolonged QT   Ace Inhibitors Cough   Clopidogrel Bisulfate Other (See Comments)    Sick, Headache, "Felt terrible"   Codeine Other (See Comments)    hallucinations   Lisinopril Cough   Penicillins Other (See Comments)  Unknown childhood reaction Did it involve swelling of the face/tongue/throat, SOB, or low BP? Unknown Did it involve sudden or severe rash/hives, skin peeling, or any reaction on the inside of your mouth or nose? Unknown Did you need to seek medical attention at a hospital or doctor's office? Unknown When did it last happen?      childhood allergy If all above answers are "NO", may proceed with cephalosporin use.      History of Present Illness    [name] is a PMH of coronary artery disease, peripheral arterial disease, chronic diastolic CHF, pericardial effusion, unstable angina, HTN, atrial fibrillation with RVR, pulmonary hypertension, COPD, lung nodule, diverticulosis, GI bleed, type 2 diabetes, end-stage renal disease on  dialysis, constipation, generalized anxiety disorder, obesity, and hyperlipidemia.  She was admitted on 06/10/2022 and discharged on 06/15/2022.  She had been hospitalized on 6/27 through 7 2 symptomatic anemia secondary to gastrointestinal bleeding.  She received 1 unit of PRBCs and underwent EGD.  She was found to have 2 bleeding lesions in her duodenum which were treated with APC and clips on 6/30.  Her apixaban and Brilinta were resumed.  After discharge she reported increased heart rate and chest pressure as well as lower abdominal pain and black stools.  She reported compliance with hemodialysis and not missed any sessions.  She was taking her medications as prescribed.  On admission she was noted to be afebrile with a pulse of 80.  Her blood pressure was 99/61 and very to 167/68.  Her oxygen saturation was stable on 2 L nasal cannula.  Her hemoglobin was 8.3 with platelets of 493 and potassium 3.2.  Her high-sensitivity troponins were 33 and 46.  Chest x-ray showed increased atelectasis in her left lung base and cardiomegaly with unchanged central vascular congestion.  Her stool was heme positive.  GI was consulted and she was placed on Protonix.  Cardiology was consulted.  Due to her PCI with DES in February it was felt reasonable to discontinue her Brilinta and substitute with aspirin.  She underwent capsule endoscopy which did not show any acute bleeding.  Her serial hemoglobins were stable.  Her Eliquis was reinitiated as well as her aspirin.  She was seen by nephrology and her HD was continued.  She presents to the clinic today for follow-up evaluation states she feels good today.  We reviewed her recent hospitalizations and she expressed understanding.  She reports compliance with her aspirin.  She reports some constant lower extremity bilateral pain and intermittent periods of cramping.  We reviewed her lower extremity arterial's from 2015.  She denies further tarry or coffee-ground stools.  I  will order lower extremity arterial's and ABIs.  Continue her current medications and plan follow-up in 3 to 4 months.  Today she denies chest pain, shortness of breath, fatigue, palpitations, melena, hematuria, hemoptysis, diaphoresis, weakness, presyncope, syncope, orthopnea, and PND.   Home Medications    Prior to Admission medications   Medication Sig Start Date End Date Taking? Authorizing Provider  acetaminophen (TYLENOL) 500 MG tablet Take 1,000 mg by mouth every 6 (six) hours as needed for moderate pain or headache.    [provider]  albuterol (PROVENTIL) (2.5 MG/3ML) 0.083% nebulizer solution Take 3 mLs (2.5 mg total) by nebulization every 6 (six) hours as needed for wheezing or shortness of breath. 12/02/21   [name], MD  apixaban (ELIQUIS) 5 MG TABS tablet Take 1 tablet (5 mg total) by mouth 2 (two) times daily.  04/05/22   Minus Breeding, MD  aspirin EC 81 MG tablet Take 1 tablet (81 mg total) by mouth daily. Swallow whole. 06/15/22   Vashti Hey, MD  atenolol (TENORMIN) 100 MG tablet Take 1 tablet (100 mg total) by mouth at bedtime. 05/10/22   [name], MD  budesonide-formoterol (SYMBICORT) 160-4.5 MCG/ACT inhaler Inhale 2 puffs into the lungs 2 (two) times daily. 05/03/22   [name], MD  calcium acetate (PHOSLO) 667 MG capsule Take 667-1,334 mg by mouth See admin instructions. Take 1334 mg with meals 3 times daily and 667 mg with snacks    [provider]  Carboxymethylcellul-Glycerin (LUBRICATING EYE DROPS OP) Place 1 drop into both eyes daily as needed (dry eyes).    [provider]  cetirizine (ZYRTEC) 10 MG tablet Take 1 tablet (10 mg total) by mouth daily as needed for allergies. 06/15/22   Vashti Hey, MD  Continuous Blood Gluc Receiver (FREESTYLE LIBRE 2 READER) DEVI USE TO TEST BLOOD SUGAR CONTINUOUSLY 11/15/21   [name], MD  Continuous Blood Gluc Sensor (FREESTYLE LIBRE 2 SENSOR) MISC Use multiple  times daily to track glucose to prevent highs and lows as a complication of dialysis Dx:Z99.2, E11.59 09/15/21   [name], MD  diltiazem (CARDIZEM CD) 240 MG 24 hr capsule Take 1 capsule (240 mg total) by mouth daily. 05/17/22   Minus Breeding, MD  gabapentin (NEURONTIN) 400 MG capsule Take 2 capsules (800 mg total) by mouth See admin instructions. Take it after HD Patient taking differently: Take 800 mg by mouth See admin instructions. Take after dialysis 03/22/22   [name], MD  glucose blood (ONETOUCH ULTRA) test strip Use to test blood sugar 3 times daily as directed. DX: E11.9 09/13/20   [name], MD  insulin aspart (NOVOLOG FLEXPEN) 100 UNIT/ML FlexPen Inject 1 Units into the skin 3 (three) times daily with meals. < 150: 0 units. 150-199:1 unit. 200-249: 2 units. 250-299: 3 units. 300-349: 4 units. Over 350: 5 units 05/19/22   Rakes, Connye Burkitt, FNP  insulin degludec (TRESIBA FLEXTOUCH) 100 UNIT/ML FlexTouch Pen Inject 20 Units into the skin at bedtime. Via novo nordisk patient assistance program Patient taking differently: Inject 30 Units into the skin at bedtime. Via novo nordisk patient assistance program 05/03/22   [name], MD  lidocaine-prilocaine (EMLA) cream Apply 1 application. topically as needed (dialysis).    [provider]  multivitamin (RENA-VIT) TABS tablet Take 1 tablet by mouth daily. 05/13/22   [provider]  nitroGLYCERIN (NITROSTAT) 0.4 MG SL tablet Place 1 tablet (0.4 mg total) under the tongue every 5 (five) minutes as needed for chest pain. 11/09/21   Minus Breeding, MD  ondansetron (ZOFRAN) 4 MG tablet TAKE 1 TABLET BY MOUTH EVERY 8 HOURS AS NEEDED FOR NAUSEA AND VOMITING Patient taking differently: Take 4 mg by mouth every 8 (eight) hours as needed for nausea or vomiting. 05/19/22   Baruch Gouty, FNP  pantoprazole (PROTONIX) 40 MG tablet Take 1 tablet (40 mg total) by mouth 2 (two) times daily. 06/04/22 08/03/22  Manuella Ghazi, Pratik D, DO   polyethylene glycol (MIRALAX / GLYCOLAX) 17 g packet Take 17 g by mouth daily as needed for moderate constipation or mild constipation. 03/10/22   Pokhrel, Corrie Mckusick, MD  rOPINIRole (REQUIP) 0.5 MG tablet Take 1 tablet (0.5 mg total) by mouth at bedtime. 06/15/22 07/15/22  Vashti Hey, MD  rosuvastatin (CRESTOR) 5 MG tablet Take 1 tablet (5 mg total) by mouth daily.  For cholesterol Patient taking differently: Take 5 mg by mouth at bedtime. For cholesterol 12/12/21   [name], MD  Semaglutide,0.25 or 0.5MG /DOS, (OZEMPIC, 0.25 OR 0.5 MG/DOSE,) 2 MG/1.5ML SOPN Inject 0.5 mg into the skin every Friday.    [provider]    Family History    Family History  Problem Relation Age of Onset   Heart disease Other    Hypertension Father    Cancer Father    Hypertension Mother    Hypertension Daughter    Pancreatitis Brother    Hypertension Daughter    Diabetes Daughter    She indicated that her mother is alive. She indicated that her father is alive. She indicated that her brother is deceased. She indicated that her maternal grandmother is deceased. She indicated that her maternal grandfather is deceased. She indicated that her paternal grandmother is deceased. She indicated that her paternal grandfather is deceased. She indicated that both of her daughters are alive. She indicated that her other is deceased.  Social History    Social History   Socioeconomic History   Marital status: Widowed    Spouse name: Not on file   Number of children: 2   Years of education: GED   Highest education level: GED or equivalent  Occupational History   Occupation: Disabled  Tobacco Use   Smoking status: Former    Packs/day: 1.00    Years: 30.00    Total pack years: 30.00    Types: Cigarettes    Quit date: 12/04/2001    Years since quitting: 20.5    Passive exposure: Never   Smokeless tobacco: Never  Vaping Use   Vaping Use: Never used  Substance and Sexual Activity   Alcohol  use: No    Alcohol/week: 6.0 standard drinks of alcohol    Types: 6 Cans of beer per week   Drug use: No   Sexual activity: Not Currently    Birth control/protection: Surgical  Other Topics Concern   Not on file  Social History Narrative   Lives in [place] with mother   Daughters live nearby   Social Determinants of Health   Financial Resource Strain: [place]  (06/09/2022)   Overall Financial Resource Strain (CARDIA)    Difficulty of Paying Living Expenses: Not hard at all  Food Insecurity: No Food Insecurity (12/16/2021)   Hunger Vital Sign    Worried About Running Out of Food in the Last Year: Never true    [place] in the Last Year: Never true  Transportation Needs: No Transportation Needs (06/09/2022)   PRAPARE - Hydrologist (Medical): No    Lack of Transportation (Non-Medical): No  Physical Activity: Inactive (12/16/2021)   Exercise Vital Sign    Days of Exercise per Week: 0 days    Minutes of Exercise per Session: 0 min  Stress: No Stress Concern Present (12/16/2021)   [place]    Feeling of Stress : Not at all  Social Connections: Moderately Isolated (12/16/2021)   Social Connection and Isolation Panel [NHANES]    Frequency of Communication with Friends and Family: More than three times a week    Frequency of Social Gatherings with Friends and Family: More than three times a week    Attends Religious Services: More than 4 times per year    Active Member of [employer] or Organizations: No    Attends Archivist Meetings: Never  Marital Status: Widowed  Intimate Partner Violence: Not At Risk (12/16/2021)   Humiliation, Afraid, Rape, and Kick questionnaire    Fear of Current or Ex-Partner: No    Emotionally Abused: No    Physically Abused: No    Sexually Abused: No     Review of Systems    General:  No chills, fever, night sweats or weight changes.   Cardiovascular:  No chest pain, dyspnea on exertion, edema, orthopnea, palpitations, paroxysmal nocturnal dyspnea. Dermatological: No rash, lesions/masses Respiratory: No cough, dyspnea Urologic: No hematuria, dysuria Abdominal:   No nausea, vomiting, diarrhea, bright red blood per rectum, melena, or hematemesis Neurologic:  No visual changes, wkns, changes in mental status. All other systems reviewed and are otherwise negative except as noted above.  Physical Exam    VS:  BP (!) 118/56   Pulse 86   Ht 5\' 3"  (1.6 m)   Wt 179 lb (81.2 kg)   SpO2 95%   BMI 31.71 kg/m  , BMI Body mass index is 31.71 kg/m. GEN: Well nourished, well developed, in no acute distress. HEENT: normal. Neck: Supple, no JVD, carotid bruits, or masses. Cardiac: RRR, no murmurs, rubs, or gallops. No clubbing, cyanosis, ankle edema right greater than left.  Radials/DP/PT 2+ and equal bilaterally.  Respiratory:  Respirations regular and unlabored, clear to auscultation bilaterally. GI: Soft, nontender, nondistended, BS + x 4. MS: no deformity or atrophy. Skin: warm and dry, no rash. Neuro:  Strength and sensation are intact. Psych: Normal affect.  Accessory Clinical Findings    Recent Labs: 03/22/2022: TSH 3.290 03/26/2022: B Natriuretic Peptide 2,276.0 06/09/2022: ALT 36 06/10/2022: Magnesium 2.0 06/15/2022: BUN 29; Creatinine, Ser 7.52; Hemoglobin 7.8; Platelets 425; Potassium 4.0; Sodium 128   Recent Lipid Panel    Component Value Date/Time   CHOL 127 03/06/2022 0616   CHOL 203 (H) 01/31/2021 1001   CHOL 241 (H) 03/25/2013 1026   TRIG 95 03/06/2022 0616   TRIG 147 10/09/2013 1527   TRIG 209 (H) 03/25/2013 1026   HDL 44 03/06/2022 0616   HDL 36 (L) 01/31/2021 1001   HDL 65 10/09/2013 1527   HDL 51 03/25/2013 1026   CHOLHDL 2.9 03/06/2022 0616   VLDL 19 03/06/2022 0616   LDLCALC 64 03/06/2022 0616   LDLCALC 124 (H) 01/31/2021 1001   LDLCALC 125 (H) 10/09/2013 1527   LDLCALC 148 (H) 03/25/2013 1026     ECG personally reviewed by me today-none today.  1.  Successful atherectomy, PTCA, and stenting of severe stenosis in the mid LAD, reducing 90% calcific stenosis to 0%, TIMI-3 flow pre and post, with a 3.0 x 18 mm Onyx frontier DES 2.  Successful atherectomy, PTCA, and stenting of severe stenosis in the mid RCA, reducing 95% calcific stenosis to 0%, TIMI-3 flow pre and post, with a 3.0 x 34 mm Onyx frontier DES   Recommendations: Patient with atrial fibrillation, significant comorbidities of end-stage renal disease.  Considering balance of antithrombotic efficacy and concern over bleeding risk with multidrug therapy, favor the following approach: Resume apixaban tomorrow at normal dosing schedule, no heparin overnight Ticagrelor 90 mg twice daily x3 months Aspirin 81 mg daily long-term after 3 months of ticagrelor completed  Diagnostic Dominance: Right  Intervention     Echocardiogram 03/05/2022  IMPRESSIONS     1. Left ventricular ejection fraction, by estimation, is 55%%. The left  ventricle has low normal function. The left ventricle has no regional wall  motion abnormalities. There is mild left ventricular hypertrophy.  Left  ventricular diastolic parameters  are indeterminate.   2. Compared to prior study, RV dysfunction is new      . Right ventricular systolic function moderate to severely decreased.  The right ventricular size is mildly enlarged. There is severely elevated  pulmonary artery systolic pressure.   3. Left atrial size was mildly dilated.   4. Right atrial size was mildly dilated.   5. A small pericardial effusion is present.   6. Mild mitral valve regurgitation.   7. The aortic valve is tricuspid. Aortic valve regurgitation is not  visualized. Aortic valve sclerosis is present, with no evidence of aortic  valve stenosis.   8. The inferior vena cava is dilated in size with <50% respiratory  variability, suggesting right atrial pressure of 15  mmHg. Assessment & Plan   1.  Coronary artery disease-denies recent episodes of chest discomfort.  Underwent PCI with DES 4/23.  Had episode of acute GI bleed and her Brilinta was discontinued and she was placed on aspirin. Continue aspirin, atenolol Heart healthy low-sodium diet Increase physical activity as tolerated  HFpEF-no increased DOE or activity intolerance.  Fluid volume stable.  Managed by HD.  Right greater than left ankle edema. Continue atenolol, diltiazem,  Heart healthy low-sodium diet Increase physical activity as tolerated  Hyperlipidemia-LDL 64 on 03/06/2022 Continue rosuvastatin, aspirin Heart healthy low-sodium diet Increase physical activity as tolerated  Essential hypertension-BP today 118/56. Continue atenolol, diltiazem Heart healthy low-sodium diet-salty 6 given Increase physical activity as tolerated   End-stage renal disease-on HD Tuesday Thursday Saturday and reports compliance. Follows with nephrology  Acute GI bleed-recent admission on 6/27 until 7/2 with acute GI bleed.  She received endoscopy APC and clips on 630.  Had follow-up capsule endoscopy which showed no acute bleeding.  Hemoglobins were stable.  She was reinitiated on apixaban and aspirin. Follows with GI and PCP  Bilateral leg pain and cramping- reports constant lower extremity discomfort and intermittent cramping  Lower extremity ABI's and LAE's  Continue current medical therapy  Disposition: Follow-up with Dr. Percival Spanish in 3-4 months.   Jossie Ng. Leeann Bady NP-C     06/26/2022, 10:19 AM [place] [address] Office [phone] Fax [phone]  Notice: This dictation was prepared with Dragon dictation along with smaller phrase technology. Any transcriptional errors that result from this process are unintentional and may not be corrected upon review.  I spent 14 minutes examining this patient, reviewing medications, and using patient  centered shared decision making involving her cardiac care.  Prior to her visit I spent greater than 20 minutes reviewing her past medical history,  medications, and prior cardiac tests.

## 2022-06-26 ENCOUNTER — Encounter: Payer: Self-pay | Admitting: General Practice

## 2022-06-26 ENCOUNTER — Encounter: Payer: Self-pay | Admitting: Cardiology

## 2022-06-26 ENCOUNTER — Ambulatory Visit (INDEPENDENT_AMBULATORY_CARE_PROVIDER_SITE_OTHER): Payer: Medicare Other | Admitting: General Practice

## 2022-06-26 ENCOUNTER — Ambulatory Visit (INDEPENDENT_AMBULATORY_CARE_PROVIDER_SITE_OTHER): Payer: Medicare Other | Admitting: Cardiology

## 2022-06-26 VITALS — BP 118/56 | HR 86 | Ht 63.0 in | Wt 179.0 lb

## 2022-06-26 VITALS — BP 124/56 | HR 92 | Ht 63.0 in | Wt 181.8 lb

## 2022-06-26 DIAGNOSIS — E785 Hyperlipidemia, unspecified: Secondary | ICD-10-CM

## 2022-06-26 DIAGNOSIS — I2510 Atherosclerotic heart disease of native coronary artery without angina pectoris: Secondary | ICD-10-CM

## 2022-06-26 DIAGNOSIS — Z992 Dependence on renal dialysis: Secondary | ICD-10-CM

## 2022-06-26 DIAGNOSIS — I10 Essential (primary) hypertension: Secondary | ICD-10-CM

## 2022-06-26 DIAGNOSIS — Z79899 Other long term (current) drug therapy: Secondary | ICD-10-CM | POA: Diagnosis not present

## 2022-06-26 DIAGNOSIS — K921 Melena: Secondary | ICD-10-CM

## 2022-06-26 DIAGNOSIS — N186 End stage renal disease: Secondary | ICD-10-CM

## 2022-06-26 DIAGNOSIS — M79605 Pain in left leg: Secondary | ICD-10-CM

## 2022-06-26 DIAGNOSIS — M79604 Pain in right leg: Secondary | ICD-10-CM | POA: Diagnosis not present

## 2022-06-26 DIAGNOSIS — I4819 Other persistent atrial fibrillation: Secondary | ICD-10-CM

## 2022-06-26 DIAGNOSIS — I5032 Chronic diastolic (congestive) heart failure: Secondary | ICD-10-CM | POA: Diagnosis not present

## 2022-06-26 DIAGNOSIS — D6869 Other thrombophilia: Secondary | ICD-10-CM | POA: Diagnosis not present

## 2022-06-26 NOTE — Patient Instructions (Signed)
Medication Instructions:  The current medical regimen is effective;  continue present plan and medications as directed. Please refer to the Current Medication list given to you today.   *If you need a refill on your cardiac medications before your next appointment, please call your pharmacy*  Lab Work:    NONE    If you have labs (blood work) drawn today and your tests are completely normal, you will receive your results only by: [place] (if you have MyChart) OR  A paper copy in the mail If you have any lab test that is abnormal or we need to change your treatment, we will call you to review the results.  Testing/Procedures:  ABI/LEA TESTING  Special Instructions PLEASE READ AND FOLLOW SALTY 6-ATTACHED-1,800mg  daily  ELEVATE LOWER EXTREMITIES WHEN NOT ACTIVE  Follow-Up: Your next appointment:  3-4 month(s) In Person with Minus Breeding, MD    Please call our office 2 months in advance to schedule this appointment  :1  At [facility], you and your health needs are our priority.  As part of our continuing mission to provide you with exceptional heart care, we have created designated Provider Care Teams.  These Care Teams include your primary Cardiologist (physician) and Advanced Practice Providers (APPs -  Physician Assistants and Nurse Practitioners) who all work together to provide you with the care you need, when you need it.  Important Information About Sugar             6 SALTY THINGS TO AVOID     1,800MG  DAILY

## 2022-06-26 NOTE — Progress Notes (Signed)
Electrophysiology Office Note   Date:  06/26/2022   ID:  [name], DOB [birth year], MRN [id number]  PCP:  Tamara Fraise, MD  Cardiologist:  [name] Primary Electrophysiologist:  Tamara Virgil Meredith Leeds, MD    Chief Complaint: AF   History of Present Illness: [name] is a 71 y.o. female who is being seen today for the evaluation of AF at the request of Tamara Breeding, MD. Presenting today for electrophysiology evaluation.  She has a history significant for diastolic heart failure, coronary artery disease, end-stage renal disease on dialysis, COPD, insulin-dependent diabetes, CVA.  She also has persistent atrial fibrillation.  She feels weak and fatigued due to her atrial fibrillation.  She had a cardioversion 04/24/2022 and felt improved after her cardioversion, but unfortunately quickly went back into atrial fibrillation.    April 2023, she had an echo that showed a normal ejection fraction with elevated right atrial pressure and severe right ventricular dysfunction.  She had a left heart catheterization that showed RCA and LAD disease and had coronary atherectomy performed.  She represented to the hospital with GI bleeding.  She has had capsule endoscopy and was started on Protonix.  Today, she denies symptoms of chest pain, orthopnea, PND, lower extremity edema, claudication, dizziness, presyncope, syncope, bleeding, or neurologic sequela. The patient is tolerating medications without difficulties.  Her major complaint today is intermittent palpitations, mild shortness of breath, and fatigue.  When she was most recently in the hospital she was started on oxygen at night which is helped with her shortness of breath.  She states that she felt improved after her cardioversion and would like to get back into normal rhythm.   Past Medical History:  Diagnosis Date   Acute on chronic diastolic CHF (congestive heart failure) ([place]) 03/05/2015   Grade 2. EF 60-65%   Anemia    hx of    Anxiety    Arthritis    CAD (coronary artery disease)    s/p Stenting in 2005;  [place] 7/09: Vigorous LV function, 2-3+ MR, RI 40%, RI stent patent, proximal-mid RCA 40-50%;  Echo 7/09:  EF 55-65%, trivial MR;  [place] 11/18/12: EF 66%, normal LV wall motion, mild to moderate anterior ischemia - reviewed by [facility] and felt to rep breast atten (low risk);  Echo 6/14: mild LVH, EF 55-65%, Gr 2 DD, PASP 44, trivial eff    Chronic kidney disease    CREATININE IS UP--GOING TO KIDNEY MD    Chronic neck pain    COPD (chronic obstructive pulmonary disease) (HCC)    Degenerative joint disease    left shoulder   Diabetic neuropathy (HCC)    Diverticulosis    Esophageal stricture    GERD (gastroesophageal reflux disease)    Hyperlipidemia    Hypertension    IDDM (insulin dependent diabetes mellitus)    Pericardial effusion 03/06/2015   Very small per echo ; pleural nodules seen on CT chest.   Peripheral vascular disease ([place])    Stroke ([place])    "mini- years ago"   Vitamin D deficiency    Past Surgical History:  Procedure Laterality Date   A/V FISTULAGRAM N/A 08/10/2017   Procedure: A/V Fistulagram - Right Arm;  Surgeon: Elam Dutch, MD;  Location: [place] [facility] LAB;  Service: Cardiovascular;  Laterality: N/A;   A/V FISTULAGRAM N/A 03/03/2020   Procedure: A/V FISTULAGRAM - Right Arm;  Surgeon: Marty Heck, MD;  Location: [place] [facility] LAB;  Service: Cardiovascular;  Laterality: N/A;  ABDOMINAL HYSTERECTOMY     AV FISTULA PLACEMENT Right 07/20/2015   Procedure: RADIAL CEPHALIC ARTERIOVENOUS FISTULA CREATION RIGHT ARM;  Surgeon: Angelia Mould, MD;  Location: [place];  Service: Vascular;  Laterality: Right;   AV FISTULA PLACEMENT Right 11/26/2015   Procedure:  Creation of RIGHT BRACHIO-CEPHALIC arteriovenous fistula;  Surgeon: Angelia Mould, MD;  Location[facility] OR;  Service: Vascular;  Laterality: Right;   BACK SURGERY     2007 & 2013   [place]  2003, 2005, 2009   1 stent   cardiac stents     CARDIOVERSION N/A 04/24/2022   Procedure: CARDIOVERSION;  Surgeon: Fay Records, MD;  Location[facility] ENDOSCOPY;  Service: Cardiovascular;  Laterality: N/A;   CATARACT EXTRACTION W/PHACO Right 05/04/2014   Procedure: CATARACT EXTRACTION PHACO AND INTRAOCULAR LENS PLACEMENT ([place]);  Surgeon: Williams Che, MD;  Location: [facility] OR[facility];  Service: Ophthalmology;  Laterality: Right;  CDE 1.47   CATARACT EXTRACTION W/PHACO Left 07/20/2014   Procedure: CATARACT EXTRACTION WITH PHACO AND INTRAOCULAR LENS PLACEMENT LEFT EYE CDE=1.79;  Surgeon: Williams Che, MD;  Location: [facility] OR[facility];  Service: Ophthalmology;  Laterality: Left;   CERVICAL SPINE SURGERY  2012   8 screws   CORONARY ATHERECTOMY N/A 03/08/2022   Procedure: CORONARY ATHERECTOMY;  Surgeon: Sherren Mocha, MD;  Location: [place] CV LAB;  Service: Cardiovascular;  Laterality: N/A;   CYSTOSCOPY WITH INJECTION N/A 06/03/2013   Procedure: MACROPLASTIQUE  INJECTION ;  Surgeon: Malka So, MD;  Location: [facility];  Service: Urology;  Laterality: N/A;   ESOPHAGEAL DILATION Right 06/02/2022   Procedure: ESOPHAGEAL DILATION;  Surgeon: Harvel Quale, MD;  Location: [facility];  Service: Gastroenterology;  Laterality: Right;   ESOPHAGOGASTRODUODENOSCOPY (EGD) WITH PROPOFOL N/A 06/02/2022   Procedure: ESOPHAGOGASTRODUODENOSCOPY (EGD) WITH PROPOFOL;  Surgeon: Harvel Quale, MD;  [facility];  Service: Gastroenterology;  Laterality: N/A;  TO BE DONE IN OR 2   FISTULOGRAM Right 10/20/2016   Procedure: FISTULOGRAM WITH POSSIBLE INTERVENTION;  Surgeon: [name], MD;  Location: [facility];  Service: Vascular;  Laterality: Right;   GIVENS CAPSULE STUDY N/A 06/11/2022   Procedure: GIVENS CAPSULE STUDY;  Surgeon: Clarene Essex, MD;  Location: [place];  Service: Gastroenterology;  Laterality: N/A;   HEMOSTASIS CLIP PLACEMENT  06/02/2022   Procedure: HEMOSTASIS CLIP  PLACEMENT;  Surgeon: Harvel Quale, MD;  [facility];  Service: Gastroenterology;;   HOT HEMOSTASIS  06/02/2022   Procedure: HOT HEMOSTASIS (ARGON PLASMA COAGULATION/BICAP);  Surgeon: Montez Morita, Quillian Quince, MD;  [facility];  Service: Gastroenterology;;   INSERTION OF DIALYSIS CATHETER N/A 11/26/2015   Procedure: INSERTION OF DIALYSIS CATHETER;  Surgeon: Angelia Mould, MD;  Location: [place];  Service: Vascular;  Laterality: N/A;   INTRAVASCULAR IMAGING/OCT N/A 03/08/2022   Procedure: INTRAVASCULAR IMAGING/OCT;  Surgeon: Sherren Mocha, MD;  Location: [place] [facility];  Service: Cardiovascular;  Laterality: N/A;   LAMINECTOMY  12/29/2011   LEFT HEART CATH AND CORONARY ANGIOGRAPHY N/A 03/06/2022   Procedure: LEFT HEART CATH AND CORONARY ANGIOGRAPHY;  Surgeon: Nelva Bush, MD;  Location: [place] [facility] LAB;  Service: Cardiovascular;  Laterality: N/A;   LUMBAR LAMINECTOMY/DECOMPRESSION MICRODISCECTOMY  12/29/2011   Procedure: LUMBAR LAMINECTOMY/DECOMPRESSION MICRODISCECTOMY;  Surgeon: Floyce Stakes, MD;  Location: [place] NEURO ORS;  Service: Neurosurgery;  Laterality: N/A;  Lumbar Two through Lumbar Five Laminectomies/Cellsaver   PARTIAL HYSTERECTOMY  PERIPHERAL VASCULAR BALLOON ANGIOPLASTY  03/03/2020   Procedure: PERIPHERAL VASCULAR BALLOON ANGIOPLASTY;  Surgeon: Marty Heck, MD;  Location: [place] CV LAB;  Service: Cardiovascular;;  Rt arm fistula   PERIPHERAL VASCULAR BALLOON ANGIOPLASTY Right 04/15/2021   Procedure: PERIPHERAL VASCULAR BALLOON ANGIOPLASTY;  Surgeon: Angelia Mould, MD;  Location: [place] [facility];  Service: Cardiovascular;  Laterality: Right;  arm fistula   PERIPHERAL VASCULAR BALLOON ANGIOPLASTY  03/14/2022   Procedure: PERIPHERAL VASCULAR BALLOON ANGIOPLASTY;  Surgeon: Broadus John, MD;  Location: [place] [facility] LAB;  Service: Cardiovascular;;  right arm AVF   PERIPHERAL VASCULAR CATHETERIZATION N/A  11/22/2015   Procedure: Fistulagram;  Surgeon: Angelia Mould, MD;  Location: [place] [facility] LAB;  Service: Cardiovascular;  Laterality: N/A;   PERIPHERAL VASCULAR INTERVENTION  08/10/2017   Procedure: PERIPHERAL VASCULAR INTERVENTION;  Surgeon: Elam Dutch, MD;  Location: [place] [facility];  Service: Cardiovascular;;   SHOULDER SURGERY Right    Rotator cuff     Current Outpatient Medications  Medication Sig Dispense Refill   acetaminophen (TYLENOL) 500 MG tablet Take 1,000 mg by mouth every 6 (six) hours as needed for moderate pain or headache.     albuterol (PROVENTIL) (2.5 MG/3ML) 0.083% nebulizer solution Take 3 mLs (2.5 mg total) by nebulization every 6 (six) hours as needed for wheezing or shortness of breath. 75 mL 12   apixaban (ELIQUIS) 5 MG TABS tablet Take 1 tablet (5 mg total) by mouth 2 (two) times daily. 180 tablet 3   aspirin EC 81 MG tablet Take 1 tablet (81 mg total) by mouth daily. Swallow whole. 30 tablet 12   atenolol (TENORMIN) 100 MG tablet Take 1 tablet (100 mg total) by mouth at bedtime. 90 tablet 3   budesonide-formoterol (SYMBICORT) 160-4.5 MCG/ACT inhaler Inhale 2 puffs into the lungs 2 (two) times daily. 3 each 4   calcium acetate (PHOSLO) 667 MG capsule Take 667-1,334 mg by mouth See admin instructions. Take 1334 mg with meals 3 times daily and 667 mg with snacks     Carboxymethylcellul-Glycerin (LUBRICATING EYE DROPS OP) Place 1 drop into both eyes daily as needed (dry eyes).     cetirizine (ZYRTEC) 10 MG tablet Take 1 tablet (10 mg total) by mouth daily as needed for allergies. 90 tablet 1   Continuous Blood Gluc Receiver (FREESTYLE LIBRE 2 READER) DEVI USE TO TEST BLOOD SUGAR CONTINUOUSLY 1 each 2   Continuous Blood Gluc Sensor (FREESTYLE LIBRE 2 SENSOR) MISC Use multiple times daily to track glucose to prevent highs and lows as a complication of dialysis Dx:Z99.2, E11.59 2 each 12   diltiazem (CARDIZEM CD) 240 MG 24 hr capsule Take 1 capsule (240 mg  total) by mouth daily. 90 capsule 3   gabapentin (NEURONTIN) 400 MG capsule Take 2 capsules (800 mg total) by mouth See admin instructions. Take it after HD (Patient taking differently: Take 800 mg by mouth See admin instructions. Take after dialysis) 30 capsule 1   glucose blood (ONETOUCH ULTRA) test strip Use to test blood sugar 3 times daily as directed. DX: E11.9 300 each 12   insulin aspart (NOVOLOG FLEXPEN) 100 UNIT/ML FlexPen Inject 1 Units into the skin 3 (three) times daily with meals. < 150: 0 units. 150-199:1 unit. 200-249: 2 units. 250-299: 3 units. 300-349: 4 units. Over 350: 5 units 15 mL 11   insulin degludec (TRESIBA FLEXTOUCH) 100 UNIT/ML FlexTouch Pen Inject 20 Units into the skin at bedtime. Via novo nordisk patient  assistance program (Patient taking differently: Inject 30 Units into the skin at bedtime. Via novo nordisk patient assistance program)     lidocaine-prilocaine (EMLA) cream Apply 1 application. topically as needed (dialysis).     multivitamin (RENA-VIT) TABS tablet Take 1 tablet by mouth daily.     nitroGLYCERIN (NITROSTAT) 0.4 MG SL tablet Place 1 tablet (0.4 mg total) under the tongue every 5 (five) minutes as needed for chest pain. 25 tablet 11   ondansetron (ZOFRAN) 4 MG tablet TAKE 1 TABLET BY MOUTH EVERY 8 HOURS AS NEEDED FOR NAUSEA AND VOMITING (Patient taking differently: Take 4 mg by mouth every 8 (eight) hours as needed for nausea or vomiting.) 20 tablet 0   pantoprazole (PROTONIX) 40 MG tablet Take 1 tablet (40 mg total) by mouth 2 (two) times daily. 120 tablet 0   polyethylene glycol (MIRALAX / GLYCOLAX) 17 g packet Take 17 g by mouth daily as needed for moderate constipation or mild constipation. 14 each 0   QUEtiapine (SEROQUEL) 100 MG tablet Take 100 mg by mouth at bedtime.     rOPINIRole (REQUIP) 0.5 MG tablet Take 1 tablet (0.5 mg total) by mouth at bedtime. 30 tablet 0   rosuvastatin (CRESTOR) 5 MG tablet Take 1 tablet (5 mg total) by mouth daily. For  cholesterol (Patient taking differently: Take 5 mg by mouth at bedtime. For cholesterol) 90 tablet 2   Semaglutide,0.25 or 0.5MG /DOS, (OZEMPIC, 0.25 OR 0.5 MG/DOSE,) 2 MG/1.5ML SOPN Inject 0.5 mg into the skin every Friday.     No current facility-administered medications for this visit.    Allergies:   Azithromycin, Ace inhibitors, Clopidogrel bisulfate, Codeine, Lisinopril, and Penicillins   Social History:  The patient  reports that she quit smoking about 20 years ago. Her smoking use included cigarettes. She has a 30.00 pack-year smoking history. She has never been exposed to tobacco smoke. She has never used smokeless tobacco. She reports that she does not drink alcohol and does not use drugs.   Family History:  [name] family history includes Cancer in her father; Diabetes in her daughter; Heart disease in an other family member; Hypertension in her daughter, daughter, father, and mother; Pancreatitis in her brother.    ROS:  Please see the history of present illness.   Otherwise, review of systems is positive for none.   All other systems are reviewed and negative.    PHYSICAL EXAM: VS:  BP (!) 124/56   Pulse 92   Ht 5\' 3"  (1.6 m)   Wt 181 lb 12.8 oz (82.5 kg)   SpO2 93%   BMI 32.20 kg/m  , BMI Body mass index is 32.2 kg/m. GEN: Well nourished, well developed, in no acute distress  HEENT: normal  Neck: no JVD, carotid bruits, or masses Cardiac: irrgular; no murmurs, rubs, or gallops,no edema  Respiratory:  clear to auscultation bilaterally, normal work of breathing GI: soft, nontender, nondistended, + BS MS: no deformity or atrophy  Skin: warm and dry Neuro:  Strength and sensation are intact Psych: euthymic mood, full affect  EKG:  EKG is not ordered today. Personal review of the ekg ordered 06/09/22 shows trill fibrillation, rate 93, anteroseptal Q waves  Recent Labs: 03/22/2022: TSH 3.290 03/26/2022: B Natriuretic Peptide 2,276.0 06/09/2022: ALT 36 06/10/2022:  Magnesium 2.0 06/15/2022: BUN 29; Creatinine, Ser 7.52; Hemoglobin 7.8; Platelets 425; Potassium 4.0; Sodium 128    Lipid Panel     Component Value Date/Time   CHOL 127 03/06/2022 0616  CHOL 203 (H) 01/31/2021 1001   CHOL 241 (H) 03/25/2013 1026   TRIG 95 03/06/2022 0616   TRIG 147 10/09/2013 1527   TRIG 209 (H) 03/25/2013 1026   HDL 44 03/06/2022 0616   HDL 36 (L) 01/31/2021 1001   HDL 65 10/09/2013 1527   HDL 51 03/25/2013 1026   CHOLHDL 2.9 03/06/2022 0616   VLDL 19 03/06/2022 0616   LDLCALC 64 03/06/2022 0616   LDLCALC 124 (H) 01/31/2021 1001   LDLCALC 125 (H) 10/09/2013 1527   LDLCALC 148 (H) 03/25/2013 1026     Wt Readings from Last 3 Encounters:  06/26/22 181 lb 12.8 oz (82.5 kg)  06/26/22 179 lb (81.2 kg)  06/15/22 170 lb 13.7 oz (77.5 kg)      Other studies Reviewed: Additional studies/ records that were reviewed today include: TTE 03/05/22  Review of the above records today demonstrates:   1. Left ventricular ejection fraction, by estimation, is 55%%. The left  ventricle has low normal function. The left ventricle has no regional wall  motion abnormalities. There is mild left ventricular hypertrophy. Left  ventricular diastolic parameters  are indeterminate.   2. Compared to prior study, RV dysfunction is new      . Right ventricular systolic function moderate to severely decreased.  The right ventricular size is mildly enlarged. There is severely elevated  pulmonary artery systolic pressure.   3. Left atrial size was mildly dilated.   4. Right atrial size was mildly dilated.   5. A small pericardial effusion is present.   6. Mild mitral valve regurgitation.   7. The aortic valve is tricuspid. Aortic valve regurgitation is not  visualized. Aortic valve sclerosis is present, with no evidence of aortic  valve stenosis.   8. The inferior vena cava is dilated in size with <50% respiratory  variability, suggesting right atrial pressure of 15 mmHg.    Catheterization 03/08/2029 1.  Successful atherectomy, PTCA, and stenting of severe stenosis in the mid LAD, reducing 90% calcific stenosis to 0%, TIMI-3 flow pre and post, with a 3.0 x 18 mm Onyx frontier DES 2.  Successful atherectomy, PTCA, and stenting of severe stenosis in the mid RCA, reducing 95% calcific stenosis to 0%, TIMI-3 flow pre and post, with a 3.0 x 34 mm Onyx frontier DES   ASSESSMENT AND PLAN:  1.  Persistent atrial fibrillation: CHA2DS2-VASc of at least 6.  Currently on Eliquis 5 mg twice daily.  She is well rate controlled.  Despite that, she does feel weak and fatigued, potentially due to her atrial fibrillation.  I do not think that she would be a good ablation candidate, as she is on dialysis with severe RV dysfunction and history of significant GI bleeding.  She would need to be on uninterrupted anticoagulation for 3 months after an ablation.  Despite that, she may feel better in sinus rhythm.  She is on Seroquel and thus we cannot start any antiarrhythmics.  She Dorlisa Savino discuss this further with her primary physician.  If she can stop this, Brailey Buescher likely plan for amiodarone load.  2.  Coronary artery disease: Currently on Eliquis and aspirin per primary cardiology.  No current chest pain.  3.  Pericardial effusion: Previously small.  Plan per primary cardiology.  4.  Hypertension: Currently well controlled  5.  Secondary hypercoagulable state: Currently on Eliquis for atrial fibrillation as above.  Case discussed with primary cardiology  Current medicines are reviewed at length with the patient today.   The  patient does not have concerns regarding her medicines.  The following changes were made today: Start amiodarone  Labs/ tests ordered today include:  No orders of the defined types were placed in this encounter.    Disposition:   FU with AF clinic 4 weeks  Signed, Ronney Honeywell Meredith Leeds, MD  06/26/2022 4:35 PM     [address] [place] [address] [phone] (office) [phone] (fax)

## 2022-06-26 NOTE — Patient Instructions (Addendum)
Medication Instructions:  Your physician has recommended you make the following change in your medication:   --- we will be in touch to discuss options   *If you need a refill on your cardiac medications before your next appointment, please call your pharmacy*   Lab Work: None ordered  If you have labs (blood work) drawn today and your tests are completely normal, you will receive your results only by: [place] (if you have MyChart) OR A paper copy in the mail If you have any lab test that is abnormal or we need to change your treatment, we will call you to review the results.   Testing/Procedures: Your physician has recommended that you have a Cardioversion (DCCV). Electrical Cardioversion uses a jolt of electricity to your heart either through paddles or wired patches attached to your chest. This is a controlled, usually prescheduled, procedure. Defibrillation is done under light anesthesia in the hospital, and you usually go home the day of the procedure. This is done to get your heart back into a normal rhythm. You are not awake for the procedure. Please see the instruction sheet given to you today.   Follow-Up: At [facility], you and your health needs are our priority.  As part of our continuing mission to provide you with exceptional heart care, we have created designated Provider Care Teams.  These Care Teams include your primary Cardiologist (physician) and Advanced Practice Providers (APPs -  Physician Assistants and Nurse Practitioners) who all work together to provide you with the care you need, when you need it.   Your next appointment:   5 week(s)  The format for your next appointment:   In Person  Provider:   You will follow up in the [facility] located at [facility]. Your provider will be: Roderic Palau, NP or Clint R. Fenton, PA-C    Thank you for choosing [facility]!!   Trinidad Curet, RN [phone]  Other  Instructions   Important Information About Sugar

## 2022-06-27 DIAGNOSIS — D631 Anemia in chronic kidney disease: Secondary | ICD-10-CM | POA: Diagnosis not present

## 2022-06-27 DIAGNOSIS — N186 End stage renal disease: Secondary | ICD-10-CM | POA: Diagnosis not present

## 2022-06-27 DIAGNOSIS — Z992 Dependence on renal dialysis: Secondary | ICD-10-CM | POA: Diagnosis not present

## 2022-06-27 DIAGNOSIS — D509 Iron deficiency anemia, unspecified: Secondary | ICD-10-CM | POA: Diagnosis not present

## 2022-06-27 DIAGNOSIS — N250 Renal osteodystrophy: Secondary | ICD-10-CM | POA: Diagnosis not present

## 2022-06-27 DIAGNOSIS — N2581 Secondary hyperparathyroidism of renal origin: Secondary | ICD-10-CM | POA: Diagnosis not present

## 2022-06-28 ENCOUNTER — Encounter: Payer: Self-pay | Admitting: Family Medicine

## 2022-06-28 ENCOUNTER — Ambulatory Visit (INDEPENDENT_AMBULATORY_CARE_PROVIDER_SITE_OTHER): Payer: Medicare Other | Admitting: Family Medicine

## 2022-06-28 VITALS — BP 137/69 | HR 88 | Temp 98.2°F | Ht 63.0 in | Wt 177.4 lb

## 2022-06-28 DIAGNOSIS — I10 Essential (primary) hypertension: Secondary | ICD-10-CM

## 2022-06-28 DIAGNOSIS — I5033 Acute on chronic diastolic (congestive) heart failure: Secondary | ICD-10-CM | POA: Diagnosis not present

## 2022-06-28 DIAGNOSIS — E1165 Type 2 diabetes mellitus with hyperglycemia: Secondary | ICD-10-CM | POA: Diagnosis not present

## 2022-06-28 DIAGNOSIS — D62 Acute posthemorrhagic anemia: Secondary | ICD-10-CM | POA: Diagnosis not present

## 2022-06-28 DIAGNOSIS — Z794 Long term (current) use of insulin: Secondary | ICD-10-CM

## 2022-06-28 DIAGNOSIS — E1169 Type 2 diabetes mellitus with other specified complication: Secondary | ICD-10-CM

## 2022-06-28 DIAGNOSIS — E785 Hyperlipidemia, unspecified: Secondary | ICD-10-CM | POA: Diagnosis not present

## 2022-06-28 DIAGNOSIS — R319 Hematuria, unspecified: Secondary | ICD-10-CM | POA: Diagnosis not present

## 2022-06-28 DIAGNOSIS — I4821 Permanent atrial fibrillation: Secondary | ICD-10-CM | POA: Diagnosis not present

## 2022-06-28 DIAGNOSIS — E1140 Type 2 diabetes mellitus with diabetic neuropathy, unspecified: Secondary | ICD-10-CM | POA: Diagnosis not present

## 2022-06-28 DIAGNOSIS — I152 Hypertension secondary to endocrine disorders: Secondary | ICD-10-CM | POA: Diagnosis not present

## 2022-06-28 DIAGNOSIS — E1159 Type 2 diabetes mellitus with other circulatory complications: Secondary | ICD-10-CM | POA: Diagnosis not present

## 2022-06-28 DIAGNOSIS — I70219 Atherosclerosis of native arteries of extremities with intermittent claudication, unspecified extremity: Secondary | ICD-10-CM | POA: Diagnosis not present

## 2022-06-28 LAB — URINALYSIS, COMPLETE
Bilirubin, UA: NEGATIVE
Glucose, UA: NEGATIVE
Nitrite, UA: NEGATIVE
Specific Gravity, UA: 1.015 (ref 1.005–1.030)
Urobilinogen, Ur: 0.2 mg/dL (ref 0.2–1.0)
pH, UA: 8.5 — ABNORMAL HIGH (ref 5.0–7.5)

## 2022-06-28 LAB — MICROSCOPIC EXAMINATION
RBC, Urine: >30 /hpf — AB (ref 0–2)
Renal Epithel, UA: NONE SEEN /hpf

## 2022-06-28 LAB — BAYER DCA HB A1C WAIVED: HB A1C (BAYER DCA - WAIVED): 6.9 % — ABNORMAL HIGH (ref 4.8–5.6)

## 2022-06-28 MED ORDER — FERROUS FUMARATE 324 (106 FE) MG PO TABS: 1.0000 | ORAL_TABLET | Freq: Every day | ORAL | 11 refills | Status: AC

## 2022-06-28 MED ORDER — QUETIAPINE FUMARATE 25 MG PO TABS: ORAL_TABLET | ORAL | 0 refills | Status: DC

## 2022-06-28 NOTE — Progress Notes (Signed)
Subjective:  Patient ID: [name], female    DOB: May 10, 1951  Age: 71 y.o. MRN: [id number]  CC: Medical Management of Chronic Issues   HPI [name] presents forFollow-up of diabetes. Patient denies symptoms such as polyuria, polydipsia, excessive hunger, nausea  Recent hospitalization with Hb drop to 7.8. From June 27 - July 2. Given 2 units prbc. Found 2 bleeders in [name] stomach. Hospitalized again July 7-13. No transfusion given. Yesterday saw blood in urine.  No significant hypoglycemic spells noted. Medications reviewed. Pt reports taking them regularly without complication/adverse reaction being reported today.    Patient in for follow-up of atrial fibrillation. Patient denies any recent bouts of chest pain or palpitations. Additionally, patient is taking anticoagulants. Patient denies any recent excessive bleeding episodes including epistaxis, bleeding from the gums, genitalia, rectal bleeding or hematuria. Additionally there has been no excessive bruising.  Patient has been evaluated for use of amiodarone.  As result [name] cardiologist has asked that [name] taper off of the Seroquel.  Instructions for performing this were provided.  See med list below. History [name] has a past medical history of Acute on chronic diastolic CHF (congestive heart failure) ([place]) (03/05/2015), Anemia, Anxiety, Arthritis, CAD (coronary artery disease), Chronic kidney disease, Chronic neck pain, COPD (chronic obstructive pulmonary disease) ([place]), Degenerative joint disease, Diabetic neuropathy ([place]), Diverticulosis, Esophageal stricture, GERD (gastroesophageal reflux disease), Hyperlipidemia, Hypertension, IDDM (insulin dependent diabetes mellitus), Pericardial effusion (03/06/2015), Peripheral vascular disease ([place]), Stroke ([place]), and Vitamin D deficiency.   [name] has a past surgical history that includes Bladder surgery; Partial hysterectomy; Shoulder surgery (Right); Cervical spine surgery (2012); Abdominal  hysterectomy; Laminectomy (12/29/2011); cardiac stents; Lumbar laminectomy/decompression microdiscectomy (12/29/2011); Back surgery; Cystoscopy with injection (N/A, 06/03/2013); Cardiac catheterization (2003, 2005, 2009); Cataract extraction w/PHACO (Right, 05/04/2014); Cataract extraction w/PHACO (Left, 07/20/2014); AV fistula placement (Right, 07/20/2015); Cardiac catheterization (N/A, 11/22/2015); Insertion of dialysis catheter (N/A, 11/26/2015); AV fistula placement (Right, 11/26/2015); Fistulogram (Right, 10/20/2016); A/V Fistulagram (N/A, 08/10/2017); PERIPHERAL VASCULAR INTERVENTION (08/10/2017); A/V Fistulagram (N/A, 03/03/2020); PERIPHERAL VASCULAR BALLOON ANGIOPLASTY (03/03/2020); PERIPHERAL VASCULAR BALLOON ANGIOPLASTY (Right, 04/15/2021); LEFT HEART CATH AND CORONARY ANGIOGRAPHY (N/A, 03/06/2022); CORONARY ATHERECTOMY (N/A, 03/08/2022); INTRAVASCULAR IMAGING/OCT (N/A, 03/08/2022); PERIPHERAL VASCULAR BALLOON ANGIOPLASTY (03/14/2022); Cardioversion (N/A, 04/24/2022); Esophagogastroduodenoscopy (egd) with propofol (N/A, 06/02/2022); Esophageal dilation (Right, 06/02/2022); Hemostasis clip placement (06/02/2022); Hot hemostasis (06/02/2022); and Givens capsule study (N/A, 06/11/2022).   [name] family history includes Cancer in [name] father; Diabetes in [name] daughter; Heart disease in an other family member; Hypertension in [name] daughter, daughter, father, and mother; Pancreatitis in [name] brother.[name] reports that [name] quit smoking about 20 years ago. [name] smoking use included cigarettes. [name] has a 30.00 pack-year smoking history. [name] has never been exposed to tobacco smoke. [name] has never used smokeless tobacco. [name] reports that [name] does not drink alcohol and does not use drugs.  Current Outpatient Medications on File Prior to Visit  Medication Sig Dispense Refill   acetaminophen (TYLENOL) 500 MG tablet Take 1,000 mg by mouth every 6 (six) hours as needed for moderate pain or headache.     albuterol (PROVENTIL) (2.5 MG/3ML) 0.083% nebulizer  solution Take 3 mLs (2.5 mg total) by nebulization every 6 (six) hours as needed for wheezing or shortness of breath. 75 mL 12   apixaban (ELIQUIS) 5 MG TABS tablet Take 1 tablet (5 mg total) by mouth 2 (two) times daily. 180 tablet 3   aspirin EC 81 MG tablet Take 1 tablet (81 mg total) by mouth daily. Swallow whole. 30 tablet  12   atenolol (TENORMIN) 100 MG tablet Take 1 tablet (100 mg total) by mouth at bedtime. 90 tablet 3   budesonide-formoterol (SYMBICORT) 160-4.5 MCG/ACT inhaler Inhale 2 puffs into the lungs 2 (two) times daily. 3 each 4   calcium acetate (PHOSLO) 667 MG capsule Take 667-1,334 mg by mouth See admin instructions. Take 1334 mg with meals 3 times daily and 667 mg with snacks     Carboxymethylcellul-Glycerin (LUBRICATING EYE DROPS OP) Place 1 drop into both eyes daily as needed (dry eyes).     cetirizine (ZYRTEC) 10 MG tablet Take 1 tablet (10 mg total) by mouth daily as needed for allergies. 90 tablet 1   Continuous Blood Gluc Receiver (FREESTYLE LIBRE 2 READER) DEVI USE TO TEST BLOOD SUGAR CONTINUOUSLY 1 each 2   Continuous Blood Gluc Sensor (FREESTYLE LIBRE 2 SENSOR) MISC Use multiple times daily to track glucose to prevent highs and lows as a complication of dialysis Dx:Z99.2, E11.59 2 each 12   diltiazem (CARDIZEM CD) 240 MG 24 hr capsule Take 1 capsule (240 mg total) by mouth daily. 90 capsule 3   gabapentin (NEURONTIN) 400 MG capsule Take 2 capsules (800 mg total) by mouth See admin instructions. Take it after HD (Patient taking differently: Take 800 mg by mouth See admin instructions. Take after dialysis) 30 capsule 1   glucose blood (ONETOUCH ULTRA) test strip Use to test blood sugar 3 times daily as directed. DX: E11.9 300 each 12   insulin aspart (NOVOLOG FLEXPEN) 100 UNIT/ML FlexPen Inject 1 Units into the skin 3 (three) times daily with meals. < 150: 0 units. 150-199:1 unit. 200-249: 2 units. 250-299: 3 units. 300-349: 4 units. Over 350: 5 units 15 mL 11   insulin  degludec (TRESIBA FLEXTOUCH) 100 UNIT/ML FlexTouch Pen Inject 20 Units into the skin at bedtime. Via novo nordisk patient assistance program (Patient taking differently: Inject 30 Units into the skin at bedtime. Via novo nordisk patient assistance program)     lidocaine-prilocaine (EMLA) cream Apply 1 application. topically as needed (dialysis).     multivitamin (RENA-VIT) TABS tablet Take 1 tablet by mouth daily.     nitroGLYCERIN (NITROSTAT) 0.4 MG SL tablet Place 1 tablet (0.4 mg total) under the tongue every 5 (five) minutes as needed for chest pain. 25 tablet 11   ondansetron (ZOFRAN) 4 MG tablet TAKE 1 TABLET BY MOUTH EVERY 8 HOURS AS NEEDED FOR NAUSEA AND VOMITING (Patient taking differently: Take 4 mg by mouth every 8 (eight) hours as needed for nausea or vomiting.) 20 tablet 0   pantoprazole (PROTONIX) 40 MG tablet Take 1 tablet (40 mg total) by mouth 2 (two) times daily. 120 tablet 0   polyethylene glycol (MIRALAX / GLYCOLAX) 17 g packet Take 17 g by mouth daily as needed for moderate constipation or mild constipation. 14 each 0   rOPINIRole (REQUIP) 0.5 MG tablet Take 1 tablet (0.5 mg total) by mouth at bedtime. 30 tablet 0   rosuvastatin (CRESTOR) 5 MG tablet Take 1 tablet (5 mg total) by mouth daily. For cholesterol (Patient taking differently: Take 5 mg by mouth at bedtime. For cholesterol) 90 tablet 2   Semaglutide,0.25 or 0.5MG/DOS, (OZEMPIC, 0.25 OR 0.5 MG/DOSE,) 2 MG/1.5ML SOPN Inject 0.5 mg into the skin every Friday.     No current facility-administered medications on file prior to visit.    ROS Review of Systems  Constitutional: Negative.   HENT: Negative.    Eyes:  Negative for visual disturbance.  Respiratory:  Negative for shortness of breath.   Cardiovascular:  Negative for chest pain.  Gastrointestinal:  Negative for abdominal pain.  Musculoskeletal:  Negative for arthralgias.    Objective:  BP 137/69   Pulse 88   Temp 98.2 F (36.8 C)   Ht 5' 3" (1.6 m)   Wt  177 lb 6.4 oz (80.5 kg)   SpO2 96%   BMI 31.42 kg/m   BP Readings from Last 3 Encounters:  06/28/22 137/69  06/26/22 (!) 124/56  06/26/22 (!) 118/56    Wt Readings from Last 3 Encounters:  06/28/22 177 lb 6.4 oz (80.5 kg)  06/26/22 181 lb 12.8 oz (82.5 kg)  06/26/22 179 lb (81.2 kg)     Physical Exam Constitutional:      General: [name] is not in acute distress.    Appearance: [name] is well-developed.  Cardiovascular:     Rate and Rhythm: Normal rate and regular rhythm.  Pulmonary:     Breath sounds: Normal breath sounds.  Musculoskeletal:        General: Normal range of motion.  Skin:    General: Skin is warm and dry.  Neurological:     Mental Status: [name] is alert and oriented to person, place, and time.       Assessment & Plan:   Heydy was seen today for medical management of chronic issues.  Diagnoses and all orders for this visit:  Type 2 diabetes mellitus with hyperglycemia, with long-term current use of insulin ([place]) -     Bayer DCA Hb A1c Waived  Essential hypertension -     CBC with Differential/Platelet -     CMP14+EGFR  Hyperlipidemia associated with type 2 diabetes mellitus (HCC) -     Lipid panel  Hematuria, unspecified type -     Urinalysis, Complete  Other orders -     QUEtiapine (SEROQUEL) 25 MG tablet; Take 3 at bedtime for three days, then two at bedtime for 3 days. Then one a day at bedtime, then DC -     Ferrous Fumarate (HEMOCYTE - 106 MG FE) 324 (106 Fe) MG TABS tablet; Take 1 tablet (106 mg of iron total) by mouth daily. -     Microscopic Examination      I have changed [name]'s QUEtiapine. I am also having [name] start on Ferrous Fumarate. Additionally, I am having [name] maintain [name] calcium acetate, acetaminophen, Carboxymethylcellul-Glycerin (LUBRICATING EYE DROPS OP), OneTouch Ultra, FreeStyle Libre 2 Sensor, Ozempic (0.25 or 0.5 MG/DOSE), nitroGLYCERIN, FreeStyle Libre 2 Reader, albuterol, rosuvastatin, polyethylene glycol,  gabapentin, apixaban, lidocaine-prilocaine, budesonide-formoterol, Tresiba FlexTouch, atenolol, multivitamin, diltiazem, ondansetron, NovoLOG FlexPen, pantoprazole, aspirin EC, rOPINIRole, and cetirizine.  Meds ordered this encounter  Medications   QUEtiapine (SEROQUEL) 25 MG tablet    Sig: Take 3 at bedtime for three days, then two at bedtime for 3 days. Then one a day at bedtime, then DC    Dispense:  18 tablet    Refill:  0   Ferrous Fumarate (HEMOCYTE - 106 MG FE) 324 (106 Fe) MG TABS tablet    Sig: Take 1 tablet (106 mg of iron total) by mouth daily.    Dispense:  30 tablet    Refill:  11     Follow-up: No follow-ups on file.  [name], M.D.

## 2022-06-29 ENCOUNTER — Ambulatory Visit: Payer: Medicare Other | Admitting: *Deleted

## 2022-06-29 ENCOUNTER — Telehealth: Payer: Self-pay | Admitting: *Deleted

## 2022-06-29 DIAGNOSIS — Z992 Dependence on renal dialysis: Secondary | ICD-10-CM | POA: Diagnosis not present

## 2022-06-29 DIAGNOSIS — N2581 Secondary hyperparathyroidism of renal origin: Secondary | ICD-10-CM | POA: Diagnosis not present

## 2022-06-29 DIAGNOSIS — I4891 Unspecified atrial fibrillation: Secondary | ICD-10-CM

## 2022-06-29 DIAGNOSIS — N186 End stage renal disease: Secondary | ICD-10-CM | POA: Diagnosis not present

## 2022-06-29 DIAGNOSIS — D631 Anemia in chronic kidney disease: Secondary | ICD-10-CM | POA: Diagnosis not present

## 2022-06-29 DIAGNOSIS — N250 Renal osteodystrophy: Secondary | ICD-10-CM | POA: Diagnosis not present

## 2022-06-29 DIAGNOSIS — D509 Iron deficiency anemia, unspecified: Secondary | ICD-10-CM | POA: Diagnosis not present

## 2022-06-29 LAB — CBC WITH DIFFERENTIAL/PLATELET
Basophils Absolute: 0.1 10*3/uL (ref 0.0–0.2)
Basos: 1 %
EOS (ABSOLUTE): 0.6 10*3/uL — ABNORMAL HIGH (ref 0.0–0.4)
Eos: 7 %
Hematocrit: 29.4 % — ABNORMAL LOW (ref 34.0–46.6)
Hemoglobin: 9.1 g/dL — ABNORMAL LOW (ref 11.1–15.9)
Immature Grans (Abs): 0 10*3/uL (ref 0.0–0.1)
Immature Granulocytes: 0 %
Lymphocytes Absolute: 1.9 10*3/uL (ref 0.7–3.1)
Lymphs: 22 %
MCH: 27.2 pg (ref 26.6–33.0)
MCHC: 31 g/dL — ABNORMAL LOW (ref 31.5–35.7)
MCV: 88 fL (ref 79–97)
Monocytes Absolute: 0.9 10*3/uL (ref 0.1–0.9)
Monocytes: 10 %
Neutrophils Absolute: 5.1 10*3/uL (ref 1.4–7.0)
Neutrophils: 60 %
Platelets: 280 10*3/uL (ref 150–450)
RBC: 3.34 x10E6/uL — ABNORMAL LOW (ref 3.77–5.28)
RDW: 17.1 % — ABNORMAL HIGH (ref 11.7–15.4)
WBC: 8.5 10*3/uL (ref 3.4–10.8)

## 2022-06-29 LAB — LIPID PANEL
Chol/HDL Ratio: 3 ratio (ref 0.0–4.4)
Cholesterol, Total: 127 mg/dL (ref 100–199)
HDL: 42 mg/dL (ref >39)
LDL Chol Calc (NIH): 66 mg/dL (ref 0–99)
Triglycerides: 100 mg/dL (ref 0–149)
VLDL Cholesterol Cal: 19 mg/dL (ref 5–40)

## 2022-06-29 LAB — CMP14+EGFR
ALT: 25 IU/L (ref 0–32)
AST: 13 IU/L (ref 0–40)
Albumin/Globulin Ratio: 2 (ref 1.2–2.2)
Albumin: 3.8 g/dL — ABNORMAL LOW (ref 3.9–4.9)
Alkaline Phosphatase: 149 IU/L — ABNORMAL HIGH (ref 44–121)
BUN/Creatinine Ratio: 5 — ABNORMAL LOW (ref 12–28)
BUN: 28 mg/dL — ABNORMAL HIGH (ref 8–27)
Bilirubin Total: 0.2 mg/dL (ref 0.0–1.2)
CO2: 26 mmol/L (ref 20–29)
Calcium: 9.1 mg/dL (ref 8.7–10.3)
Chloride: 98 mmol/L (ref 96–106)
Creatinine, Ser: 6.19 mg/dL (ref 0.57–1.00)
Globulin, Total: 1.9 g/dL (ref 1.5–4.5)
Glucose: 148 mg/dL — ABNORMAL HIGH (ref 70–99)
Potassium: 5.4 mmol/L — ABNORMAL HIGH (ref 3.5–5.2)
Sodium: 141 mmol/L (ref 134–144)
Total Protein: 5.7 g/dL — ABNORMAL LOW (ref 6.0–8.5)
eGFR: 7 mL/min/{1.73_m2} — ABNORMAL LOW (ref >59)

## 2022-06-29 NOTE — Chronic Care Management (AMB) (Signed)
Care Management    RN Visit Note  06/29/2022 Name: [name] MRN: [id number] DOB: [birth year]  Subjective: [name] is a 71 y.o. year old female who is a primary care patient of [name], [name], MD. The care management team was consulted for assistance with disease management and care coordination needs.    Chart review and unsuccessful telephone outreach  for  follow-up and discuss RN Care Manager case closure and transition to Care Coordination Nurse  Patient has either met RN Care Management goals, is stable from [place] Management perspective, or has not recently engaged with the [facility]. I am removing RN Care Manager from Care Team and closing [place]. Patient is currently engaged with another CCM team member, Lottie Dawson, PharmD. If they are engaged with another CCM Team Member, I will forward this case closure encounter to them. Patient does not have a current CCM referral placed since 04/03/22. CCM enrollment status was not accurate and was changed to "not enrolled".  Assessment: Review of patient past medical history, allergies, medications, health status, including review of consultants reports, laboratory and other test data, was performed as part of comprehensive evaluation and provision of chronic care management services.   SDOH (Social Determinants of Health) assessments and interventions performed:    Care Plan  Allergies  Allergen Reactions   Azithromycin     Prolonged QT   Ace Inhibitors Cough   Clopidogrel Bisulfate Other (See Comments)    Sick, Headache, "Felt terrible"   Codeine Other (See Comments)    hallucinations   Lisinopril Cough   Penicillins Other (See Comments)    Unknown childhood reaction Did it involve swelling of the face/tongue/throat, SOB, or low BP? Unknown Did it involve sudden or severe rash/hives, skin peeling, or any reaction on the inside of your mouth or nose? Unknown Did you need to seek medical  attention at a hospital or doctor's office? Unknown When did it last happen?      childhood allergy If all above answers are "NO", may proceed with cephalosporin use.      Outpatient Encounter Medications as of 06/29/2022  Medication Sig   acetaminophen (TYLENOL) 500 MG tablet Take 1,000 mg by mouth every 6 (six) hours as needed for moderate pain or headache.   albuterol (PROVENTIL) (2.5 MG/3ML) 0.083% nebulizer solution Take 3 mLs (2.5 mg total) by nebulization every 6 (six) hours as needed for wheezing or shortness of breath.   apixaban (ELIQUIS) 5 MG TABS tablet Take 1 tablet (5 mg total) by mouth 2 (two) times daily.   aspirin EC 81 MG tablet Take 1 tablet (81 mg total) by mouth daily. Swallow whole.   atenolol (TENORMIN) 100 MG tablet Take 1 tablet (100 mg total) by mouth at bedtime.   budesonide-formoterol (SYMBICORT) 160-4.5 MCG/ACT inhaler Inhale 2 puffs into the lungs 2 (two) times daily.   calcium acetate (PHOSLO) 667 MG capsule Take 667-1,334 mg by mouth See admin instructions. Take 1334 mg with meals 3 times daily and 667 mg with snacks   Carboxymethylcellul-Glycerin (LUBRICATING EYE DROPS OP) Place 1 drop into both eyes daily as needed (dry eyes).   cetirizine (ZYRTEC) 10 MG tablet Take 1 tablet (10 mg total) by mouth daily as needed for allergies.   Continuous Blood Gluc Receiver (FREESTYLE LIBRE 2 READER) DEVI USE TO TEST BLOOD SUGAR CONTINUOUSLY   Continuous Blood Gluc Sensor (FREESTYLE LIBRE 2 SENSOR) MISC Use multiple times daily to track glucose to  prevent highs and lows as a complication of dialysis Dx:Z99.2, E11.59   diltiazem (CARDIZEM CD) 240 MG 24 hr capsule Take 1 capsule (240 mg total) by mouth daily.   Ferrous Fumarate (HEMOCYTE - 106 MG FE) 324 (106 Fe) MG TABS tablet Take 1 tablet (106 mg of iron total) by mouth daily.   gabapentin (NEURONTIN) 400 MG capsule Take 2 capsules (800 mg total) by mouth See admin instructions. Take it after HD (Patient taking differently:  Take 800 mg by mouth See admin instructions. Take after dialysis)   glucose blood (ONETOUCH ULTRA) test strip Use to test blood sugar 3 times daily as directed. DX: E11.9   insulin aspart (NOVOLOG FLEXPEN) 100 UNIT/ML FlexPen Inject 1 Units into the skin 3 (three) times daily with meals. < 150: 0 units. 150-199:1 unit. 200-249: 2 units. 250-299: 3 units. 300-349: 4 units. Over 350: 5 units   insulin degludec (TRESIBA FLEXTOUCH) 100 UNIT/ML FlexTouch Pen Inject 20 Units into the skin at bedtime. Via novo nordisk patient assistance program (Patient taking differently: Inject 30 Units into the skin at bedtime. Via novo nordisk patient assistance program)   lidocaine-prilocaine (EMLA) cream Apply 1 application. topically as needed (dialysis).   multivitamin (RENA-VIT) TABS tablet Take 1 tablet by mouth daily.   nitroGLYCERIN (NITROSTAT) 0.4 MG SL tablet Place 1 tablet (0.4 mg total) under the tongue every 5 (five) minutes as needed for chest pain.   ondansetron (ZOFRAN) 4 MG tablet TAKE 1 TABLET BY MOUTH EVERY 8 HOURS AS NEEDED FOR NAUSEA AND VOMITING (Patient taking differently: Take 4 mg by mouth every 8 (eight) hours as needed for nausea or vomiting.)   pantoprazole (PROTONIX) 40 MG tablet Take 1 tablet (40 mg total) by mouth 2 (two) times daily.   polyethylene glycol (MIRALAX / GLYCOLAX) 17 g packet Take 17 g by mouth daily as needed for moderate constipation or mild constipation.   QUEtiapine (SEROQUEL) 25 MG tablet Take 3 at bedtime for three days, then two at bedtime for 3 days. Then one a day at bedtime, then DC   rOPINIRole (REQUIP) 0.5 MG tablet Take 1 tablet (0.5 mg total) by mouth at bedtime.   rosuvastatin (CRESTOR) 5 MG tablet Take 1 tablet (5 mg total) by mouth daily. For cholesterol (Patient taking differently: Take 5 mg by mouth at bedtime. For cholesterol)   Semaglutide,0.25 or 0.5MG/DOS, (OZEMPIC, 0.25 OR 0.5 MG/DOSE,) 2 MG/1.5ML SOPN Inject 0.5 mg into the skin every Friday.   No  facility-administered encounter medications on file as of 06/29/2022.    Patient Active Problem List   Diagnosis Date Noted   Anxiety    Acute blood loss anemia 06/10/2022   Permanent atrial fibrillation ([place]) 06/10/2022   CAD S/P percutaneous coronary angioplasty 06/10/2022   Heart failure with preserved ejection fraction ([place]) 06/10/2022   Symptomatic anemia 05/30/2022   GI bleed 05/30/2022   Acute respiratory failure with hypoxia ([place]) 04/26/2022   Sepsis ([place]) 04/26/2022   PAF (paroxysmal atrial fibrillation) ([employer]) 04/26/2022   Multifocal pneumonia 04/25/2022   Dyslipidemia 04/04/2022   Acute exacerbation of Diastolic CHF (congestive heart failure) ([place]) 03/26/2022   Physical deconditioning 03/09/2022   Hyponatremia 03/09/2022   Pulmonary hypertension ([place]) 03/06/2022   Hypokalemia 03/05/2022   Atrial fibrillation with RVR ([place]) 03/04/2022   Change in bowel habit 12/16/2021   Colon cancer screening 12/16/2021   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   Family history of colonic polyps 12/16/2021   Acute CHF (congestive heart failure) ([place]) 12/06/2021  Leukocytosis 12/06/2021   Elevated brain natriuretic peptide (BNP) level 12/06/2021   GERD (gastroesophageal reflux disease) 12/06/2021   Nausea 08/26/2021   Lumbago of lumbar region with sciatica 07/21/2021   Arthropathy of lumbar facet joint 06/27/2021   Radiculopathy, lumbar region 05/05/2021   Chest pain 02/19/2021   Essential hypertension    Mixed hyperlipidemia    Hypoalbuminemia    Bruit 01/05/2020   Educated about COVID-19 virus infection 01/05/2020   Lung nodule 05/01/2018   Prolonged Q-T interval on ECG 09/06/2017   Unstable angina ([place]) 09/06/2017   GAD (generalized anxiety disorder) 03/04/2016   Leg pain 01/07/2016   Anemia of chronic disease/ESRD 12/10/2015   Dependence on renal dialysis ([place]) 12/10/2015   ESRD (end stage renal disease) on dialysis ([place]) 12/10/2015   Osteopathy in diseases classified  elsewhere, unspecified site 12/10/2015   Constipation 10/25/2015   COPD (chronic obstructive pulmonary disease) ([place]) 03/06/2015   Pericardial effusion 03/06/2015   Chronic diastolic CHF (congestive heart failure) ([place]) 03/05/2015   PAD (peripheral artery disease) ([place]) 04/21/2014   Dyspnea 09/29/2013   Obesity (BMI 30.0-34.9) 12/05/2011   Diverticulosis 03/29/2011   Diabetic neuropathy ([place]) 03/29/2011   Esophageal stricture 03/29/2011   Vitamin D deficiency 03/29/2011   Degenerative joint disease of left shoulder 03/29/2011   Neck pain 03/29/2011   Type 2 diabetes mellitus with hyperlipidemia ([place]) 04/19/2009   Hyperlipidemia associated with type 2 diabetes mellitus ([place]) 04/19/2009   Coronary artery disease of native artery of native heart with stable angina pectoris ([place]) 04/19/2009    Conditions to be addressed/monitored: Atrial Fibrillation and DMII  Care Plan : [facility] Care Plan  Updates made by Ilean China, RN since 06/29/2022 12:00 AM     Problem: Chronic Disease Management Needs Resolved 06/29/2022  Priority: High  Onset Date: 06/09/2022     Long-Range Goal: Patient Will Work With RN Care Manager Regarding Care Management and [place] with AFib and ESRD Completed 06/29/2022  Start Date: 06/09/2022  Expected End Date: 06/10/2023  Recent Progress: On track  Priority: High  Note:   Closing [place] and scheduled f/u with Care Coordination Nurse  Current Barriers:  Chronic Disease Management support and education needs related to Atrial Fibrillation and ESRD  RNCM Clinical Goal(s):  Patient will continue to work with RN Care Manager and/or Social Worker to address care management and care coordination needs related to Atrial Fibrillation and ESRD as evidenced by adherence to CM Team Scheduled appointments     through collaboration with RN Care manager, provider, and care team.   Interventions: 1:1 collaboration with primary care provider regarding  development and update of comprehensive plan of care as evidenced by provider attestation and co-signature Inter-disciplinary care team collaboration (see longitudinal plan of care) Evaluation of current treatment plan related to  self management and patient's adherence to plan as established by provider   A-fib:  (Status: New goal.) Long Term Goal  Counseled on increased risk of stroke due to Afib and benefits of anticoagulation for stroke prevention           Reviewed importance of adherence to anticoagulant exactly as prescribed Advised patient to discuss symptomatic Afib with provider Counseled on bleeding risk associated with Eliquis and importance of self-monitoring for signs/symptoms of bleeding Counseled on avoidance of NSAIDs due to increased bleeding risk with anticoagulants Counseled on seeking medical attention after a head injury or if there is blood in the urine/stool Assessed social determinant of health barriers Reviewed recent  hospital and ED notes and discussed with patient. Patient went to ED yesterday for symptomatic Afib but left without being fully evaluated because of the long wait.  Asked patient to check heart rate while on the phone with me. Her heart rate varied from 42 to >100 Reviewed upcoming appointments with cardiology and PCP Collaborated with cardiology office [phone] and requested that patient be put on a cancellation list so that she can get a hosp follow-up appointment sooner than is scheduled Also left a message asking cardiology nurse to call patient and discuss symptomatic afib Advised patient to monitor symptoms and heart rate and to seek appropriate medial attention if needed  Patient Goals/Self-Care Activities: Take medications as prescribed   Call provider office for new concerns or questions  Monitor heart rate and talk with cardiologist [phone] if you have any new or worsening symptoms Call RN Care Manager as needed  [phone]    Plan: Telephone follow up appointment with care management team member scheduled for:  07/11/22 with Jackelyn Poling, RN Care Coordinator Their PCP can place a new referral if the patient needs Care Management or Care Coordination services in the future.  Chong Sicilian, BSN, RN-BC Embedded Chronic Care Manager [facility] / [facility] [facility] Direct Dial: [phone]

## 2022-06-29 NOTE — Telephone Encounter (Signed)
[facility] called with critical lab of Creatinine 6.19

## 2022-06-29 NOTE — Patient Instructions (Signed)
Visit Information  Please don't hesitate to contact me if I can be of assistance to you before your next scheduled telephone appointment.  Following are the goals we discussed today:  Take medications as prescribed   Call provider office for new concerns or questions  Monitor heart rate and talk with cardiologist [phone] if you have any new or worsening symptoms Call RN Care Manager as needed [phone]  I have enjoyed working with you through the Chronic Care Management Program at [facility] or I was listed on your Care Team at some point within the last 4 years. Due to program changes I am removing myself from your care team.  If you are currently active with another CCM Team Member, you will remain active with them unless they reach out to you with additional information.   If you feel that you need services in the future,  please talk with your primary care provider and request a new referral for Care Management or Care Coordination. This does not affect your status as a patient at [facility].   Thank you for allowing me to participate in your your healthcare journey.   Plan: Telephone follow up appointment with care management team member scheduled for:  07/11/22 with Jackelyn Poling, RN Care Coordinator Their PCP can place a new referral if the patient needs Care Management or Care Coordination services in the future.  Please call the care guide team at [phone] if you need to cancel or reschedule your appointment.   If you are experiencing a Mental Health or [place] or need someone to talk to, please call the [facility]: [phone] call [phone]   Patient verbalizes understanding of instructions and care plan provided today and agrees to view in [place]. Active MyChart status and patient understanding of how to access instructions and care plan via MyChart confirmed with patient.     Chong Sicilian, BSN, RN-BC Embedded Chronic Care Manager [facility] [place] Management Direct Dial: [phone]

## 2022-06-30 NOTE — Progress Notes (Signed)
Patient calling back. Please return call.  

## 2022-07-01 DIAGNOSIS — D631 Anemia in chronic kidney disease: Secondary | ICD-10-CM | POA: Diagnosis not present

## 2022-07-01 DIAGNOSIS — N2581 Secondary hyperparathyroidism of renal origin: Secondary | ICD-10-CM | POA: Diagnosis not present

## 2022-07-01 DIAGNOSIS — N250 Renal osteodystrophy: Secondary | ICD-10-CM | POA: Diagnosis not present

## 2022-07-01 DIAGNOSIS — Z992 Dependence on renal dialysis: Secondary | ICD-10-CM | POA: Diagnosis not present

## 2022-07-01 DIAGNOSIS — N186 End stage renal disease: Secondary | ICD-10-CM | POA: Diagnosis not present

## 2022-07-01 DIAGNOSIS — D509 Iron deficiency anemia, unspecified: Secondary | ICD-10-CM | POA: Diagnosis not present

## 2022-07-02 ENCOUNTER — Other Ambulatory Visit: Payer: Self-pay | Admitting: Family Medicine

## 2022-07-03 ENCOUNTER — Encounter: Payer: Self-pay | Admitting: Cardiology

## 2022-07-03 DIAGNOSIS — D62 Acute posthemorrhagic anemia: Secondary | ICD-10-CM | POA: Diagnosis not present

## 2022-07-03 DIAGNOSIS — I152 Hypertension secondary to endocrine disorders: Secondary | ICD-10-CM | POA: Diagnosis not present

## 2022-07-03 DIAGNOSIS — I5033 Acute on chronic diastolic (congestive) heart failure: Secondary | ICD-10-CM | POA: Diagnosis not present

## 2022-07-03 DIAGNOSIS — I4821 Permanent atrial fibrillation: Secondary | ICD-10-CM | POA: Diagnosis not present

## 2022-07-03 DIAGNOSIS — Z992 Dependence on renal dialysis: Secondary | ICD-10-CM | POA: Diagnosis not present

## 2022-07-03 DIAGNOSIS — E1159 Type 2 diabetes mellitus with other circulatory complications: Secondary | ICD-10-CM | POA: Diagnosis not present

## 2022-07-03 DIAGNOSIS — E1140 Type 2 diabetes mellitus with diabetic neuropathy, unspecified: Secondary | ICD-10-CM | POA: Diagnosis not present

## 2022-07-03 DIAGNOSIS — I4891 Unspecified atrial fibrillation: Secondary | ICD-10-CM | POA: Diagnosis not present

## 2022-07-03 DIAGNOSIS — N186 End stage renal disease: Secondary | ICD-10-CM

## 2022-07-04 DIAGNOSIS — Z992 Dependence on renal dialysis: Secondary | ICD-10-CM | POA: Diagnosis not present

## 2022-07-04 DIAGNOSIS — N186 End stage renal disease: Secondary | ICD-10-CM | POA: Diagnosis not present

## 2022-07-04 DIAGNOSIS — E559 Vitamin D deficiency, unspecified: Secondary | ICD-10-CM | POA: Diagnosis not present

## 2022-07-04 DIAGNOSIS — D509 Iron deficiency anemia, unspecified: Secondary | ICD-10-CM | POA: Diagnosis not present

## 2022-07-04 DIAGNOSIS — N2581 Secondary hyperparathyroidism of renal origin: Secondary | ICD-10-CM | POA: Diagnosis not present

## 2022-07-04 DIAGNOSIS — D631 Anemia in chronic kidney disease: Secondary | ICD-10-CM | POA: Diagnosis not present

## 2022-07-04 DIAGNOSIS — N250 Renal osteodystrophy: Secondary | ICD-10-CM | POA: Diagnosis not present

## 2022-07-04 NOTE — Telephone Encounter (Signed)
Pt reports she is taking Hydroxyzine occasionally. Aware this too does not go well with Amiodarone. Aware I will forward back to pharmD for suggestions of safe anxiety medication she could take w/ Amiodarone. Pt aware we will follow up once reviewed/advised and agreeable to plan.

## 2022-07-04 NOTE — Telephone Encounter (Signed)
Left message to call back  

## 2022-07-06 DIAGNOSIS — D509 Iron deficiency anemia, unspecified: Secondary | ICD-10-CM | POA: Diagnosis not present

## 2022-07-06 DIAGNOSIS — N186 End stage renal disease: Secondary | ICD-10-CM | POA: Diagnosis not present

## 2022-07-06 DIAGNOSIS — N2581 Secondary hyperparathyroidism of renal origin: Secondary | ICD-10-CM | POA: Diagnosis not present

## 2022-07-06 DIAGNOSIS — Z992 Dependence on renal dialysis: Secondary | ICD-10-CM | POA: Diagnosis not present

## 2022-07-06 DIAGNOSIS — N250 Renal osteodystrophy: Secondary | ICD-10-CM | POA: Diagnosis not present

## 2022-07-06 DIAGNOSIS — D631 Anemia in chronic kidney disease: Secondary | ICD-10-CM | POA: Diagnosis not present

## 2022-07-07 DIAGNOSIS — E1159 Type 2 diabetes mellitus with other circulatory complications: Secondary | ICD-10-CM | POA: Diagnosis not present

## 2022-07-07 DIAGNOSIS — I4821 Permanent atrial fibrillation: Secondary | ICD-10-CM | POA: Diagnosis not present

## 2022-07-07 DIAGNOSIS — I5033 Acute on chronic diastolic (congestive) heart failure: Secondary | ICD-10-CM | POA: Diagnosis not present

## 2022-07-07 DIAGNOSIS — I152 Hypertension secondary to endocrine disorders: Secondary | ICD-10-CM | POA: Diagnosis not present

## 2022-07-07 DIAGNOSIS — E1140 Type 2 diabetes mellitus with diabetic neuropathy, unspecified: Secondary | ICD-10-CM | POA: Diagnosis not present

## 2022-07-07 DIAGNOSIS — D62 Acute posthemorrhagic anemia: Secondary | ICD-10-CM | POA: Diagnosis not present

## 2022-07-08 DIAGNOSIS — N186 End stage renal disease: Secondary | ICD-10-CM | POA: Diagnosis not present

## 2022-07-08 DIAGNOSIS — N250 Renal osteodystrophy: Secondary | ICD-10-CM | POA: Diagnosis not present

## 2022-07-08 DIAGNOSIS — D509 Iron deficiency anemia, unspecified: Secondary | ICD-10-CM | POA: Diagnosis not present

## 2022-07-08 DIAGNOSIS — N2581 Secondary hyperparathyroidism of renal origin: Secondary | ICD-10-CM | POA: Diagnosis not present

## 2022-07-08 DIAGNOSIS — D631 Anemia in chronic kidney disease: Secondary | ICD-10-CM | POA: Diagnosis not present

## 2022-07-08 DIAGNOSIS — Z992 Dependence on renal dialysis: Secondary | ICD-10-CM | POA: Diagnosis not present

## 2022-07-10 ENCOUNTER — Ambulatory Visit (HOSPITAL_COMMUNITY)
Admission: RE | Admit: 2022-07-10 | Discharge: 2022-07-10 | Disposition: A | Discharge status: Home / Self Care | Payer: Medicare Other | Source: Ambulatory Visit | Attending: Internal Medicine | Admitting: Internal Medicine

## 2022-07-10 DIAGNOSIS — I214 Non-ST elevation (NSTEMI) myocardial infarction: Secondary | ICD-10-CM | POA: Insufficient documentation

## 2022-07-10 DIAGNOSIS — M79605 Pain in left leg: Secondary | ICD-10-CM | POA: Diagnosis not present

## 2022-07-10 DIAGNOSIS — M79604 Pain in right leg: Secondary | ICD-10-CM | POA: Diagnosis not present

## 2022-07-10 DIAGNOSIS — I482 Chronic atrial fibrillation, unspecified: Secondary | ICD-10-CM | POA: Insufficient documentation

## 2022-07-10 NOTE — Progress Notes (Unsigned)
Cardiology Office Note   Date:  07/12/2022   ID:  [name], DOB 1951/02/15, MRN [id number]  PCP:  [name], MD  Cardiologist:   Minus Breeding, MD   Chief Complaint  Patient presents with   Leg Pain        History of Present Illness: [name] is a 71 y.o. female who presents for follow up of CAD, diastolic heart failure with diastolic dysfunction and atrial fib.   She was in the hospital with a NSTEMI.  She had PCI as below.  She had atrial fib and was treated with DOAC.   Since I saw her in the hospital with her non-STEMI she has been back again with atrial fibrillation rapid rate and I reviewed these records for this visit.  She had some excess volume and required an additional dialysis.  She had Cardizem to control her rate.  This was IV.  Of note she has had some bleeding issues with her dialysis access and had to hold her Eliquis for several days.  She had 2 hospitalizations in April.  She was in the hospital again in May with respiratory failure.  I reviewed these records for this visit.  She had multifocular pneumonia.  She PAF but ws in NSR at the end of the hospitalization.  At discharge Torsemide was discontinued.  She went back into atrial fibrillation.  She was hospitalized on 6/27 through 7/2 symptomatic anemia secondary to gastrointestinal bleeding.  She received 1 unit of PRBCs and underwent EGD.  She was found to have 2 bleeding lesions in her duodenum which were treated with APC and clips on 6/30.   She was last admitted on 06/10/2022 and discharged on 06/15/2022.  Again, she had acute blood loss anemia.  She had a negative capsule endoscopy.  Brilinta was stopped.    She returns for follow up.  She has seen Dr. Curt Bears.  He has stopped her Seroquel in anticipation of starting her on amiodarone.  She does not yet have an appointment scheduled in the atrial fibrillation clinic.  She is feeling better since she was out of the hospital and she thinks in large  part because she got oxygen at night because she had some nighttime oxygen desaturations.  Her biggest complaint is leg pain right now.  She has had ABIs that demonstrated moderate disease but it was not felt that she needed intervention.  In fact she saw Dr. Donnetta Hutching today.  He is planning probably to work on her dialysis fistula but does not suggest any revascularization of her legs.  She says she has a burning discomfort from the mid thighs down.  She has some leg weakness.  She is not having any chest pressure, neck or arm discomfort.  She has had some low blood pressures with dialysis recently.  She has got black stools since being on iron but she is not having any red stools like she was having.  She is not having to take any nitroglycerin.  She is not having any presyncope or syncope.   Past Medical History:  Diagnosis Date   Acute on chronic diastolic CHF (congestive heart failure) ([place]) 03/05/2015   Grade 2. EF 60-65%   Anemia    hx of   Anxiety    Arthritis    CAD (coronary artery disease)    s/p Stenting in 2005;  [place] 7/09: Vigorous LV function, 2-3+ MR, RI 40%, RI stent patent, proximal-mid RCA 40-50%;  Echo 7/09:  EF 55-65%, trivial MR;  Myoview 11/18/12: EF 66%, normal LV wall motion, mild to moderate anterior ischemia - reviewed by [facility] and felt to rep breast atten (low risk);  Echo 6/14: mild LVH, EF 55-65%, Gr 2 DD, PASP 44, trivial eff    Chronic kidney disease    CREATININE IS UP--GOING TO KIDNEY MD    Chronic neck pain    COPD (chronic obstructive pulmonary disease) (HCC)    Degenerative joint disease    left shoulder   Diabetic neuropathy (HCC)    Diverticulosis    Esophageal stricture    GERD (gastroesophageal reflux disease)    Hyperlipidemia    Hypertension    IDDM (insulin dependent diabetes mellitus)    Pericardial effusion 03/06/2015   Very small per echo ; pleural nodules seen on CT chest.   Peripheral vascular disease ([place])    Stroke ([place])    "mini- years ago"    Vitamin D deficiency     Past Surgical History:  Procedure Laterality Date   A/V FISTULAGRAM N/A 08/10/2017   Procedure: A/V Fistulagram - Right Arm;  Surgeon: Elam Dutch, MD;  Location: [place] CV LAB;  Service: Cardiovascular;  Laterality: N/A;   A/V FISTULAGRAM N/A 03/03/2020   Procedure: A/V FISTULAGRAM - Right Arm;  Surgeon: Marty Heck, MD;  Location: [place] CV LAB;  Service: Cardiovascular;  Laterality: N/A;   ABDOMINAL HYSTERECTOMY     AV FISTULA PLACEMENT Right 07/20/2015   Procedure: RADIAL CEPHALIC ARTERIOVENOUS FISTULA CREATION RIGHT ARM;  Surgeon: Angelia Mould, MD;  Location: [place];  Service: Vascular;  Laterality: Right;   AV FISTULA PLACEMENT Right 11/26/2015   Procedure:  Creation of RIGHT BRACHIO-CEPHALIC arteriovenous fistula;  Surgeon: Angelia Mould, MD;  [facility] OR;  Service: Vascular;  Laterality: Right;   BACK SURGERY     2007 & 2013   [place]  2003, 2005, 2009   1 stent   cardiac stents     CARDIOVERSION N/A 04/24/2022   Procedure: CARDIOVERSION;  Surgeon: Fay Records, MD;  Location: [facility] ENDOSCOPY;  Service: Cardiovascular;  Laterality: N/A;   CATARACT EXTRACTION W/PHACO Right 05/04/2014   Procedure: CATARACT EXTRACTION PHACO AND INTRAOCULAR LENS PLACEMENT ([place]);  Surgeon: Williams Che, MD;  Location: [facility];  Service: Ophthalmology;  Laterality: Right;  CDE 1.47   CATARACT EXTRACTION W/PHACO Left 07/20/2014   Procedure: CATARACT EXTRACTION WITH PHACO AND INTRAOCULAR LENS PLACEMENT LEFT EYE CDE=1.79;  Surgeon: Williams Che, MD;  Location: [facility];  Service: Ophthalmology;  Laterality: Left;   CERVICAL SPINE SURGERY  2012   8 screws   CORONARY ATHERECTOMY N/A 03/08/2022   Procedure: CORONARY ATHERECTOMY;  Surgeon: Sherren Mocha, MD;  Location: [place] CV LAB;  Service: Cardiovascular;  Laterality: N/A;   CYSTOSCOPY WITH INJECTION N/A 06/03/2013   Procedure: MACROPLASTIQUE   INJECTION ;  Surgeon: Malka So, MD;  Location: [facility] OR[facility];  Service: Urology;  Laterality: N/A;   ESOPHAGEAL DILATION Right 06/02/2022   Procedure: ESOPHAGEAL DILATION;  Surgeon: Harvel Quale, MD;  [facility];  Service: Gastroenterology;  Laterality: Right;   ESOPHAGOGASTRODUODENOSCOPY (EGD) WITH PROPOFOL N/A 06/02/2022   Procedure: ESOPHAGOGASTRODUODENOSCOPY (EGD) WITH PROPOFOL;  Surgeon: Harvel Quale, MD;  [facility];  Service: Gastroenterology;  Laterality: N/A;  TO BE DONE IN OR 2   FISTULOGRAM Right 10/20/2016   Procedure: FISTULOGRAM WITH POSSIBLE INTERVENTION;  Surgeon: Corene Cornea  Yolanda Bonine, MD;  Location: [facility];  Service: Vascular;  Laterality: Right;   GIVENS CAPSULE STUDY N/A 06/11/2022   Procedure: GIVENS CAPSULE STUDY;  Surgeon: Clarene Essex, MD;  Location: [place];  Service: Gastroenterology;  Laterality: N/A;   HEMOSTASIS CLIP PLACEMENT  06/02/2022   Procedure: HEMOSTASIS CLIP PLACEMENT;  Surgeon: Harvel Quale, MD;  [facility];  Service: Gastroenterology;;   HOT HEMOSTASIS  06/02/2022   Procedure: HOT HEMOSTASIS (ARGON PLASMA COAGULATION/BICAP);  Surgeon: Montez Morita, Quillian Quince, MD;  [facility];  Service: Gastroenterology;;   INSERTION OF DIALYSIS CATHETER N/A 11/26/2015   Procedure: INSERTION OF DIALYSIS CATHETER;  Surgeon: Angelia Mould, MD;  Location: [place];  Service: Vascular;  Laterality: N/A;   INTRAVASCULAR IMAGING/OCT N/A 03/08/2022   Procedure: INTRAVASCULAR IMAGING/OCT;  Surgeon: Sherren Mocha, MD;  Location: [place] CV LAB;  Service: Cardiovascular;  Laterality: N/A;   LAMINECTOMY  12/29/2011   LEFT HEART CATH AND CORONARY ANGIOGRAPHY N/A 03/06/2022   Procedure: LEFT HEART CATH AND CORONARY ANGIOGRAPHY;  Surgeon: Nelva Bush, MD;  Location: [place] CV LAB;  Service: Cardiovascular;  Laterality: N/A;   LUMBAR LAMINECTOMY/DECOMPRESSION MICRODISCECTOMY  12/29/2011    Procedure: LUMBAR LAMINECTOMY/DECOMPRESSION MICRODISCECTOMY;  Surgeon: Floyce Stakes, MD;  Location: [place] NEURO ORS;  Service: Neurosurgery;  Laterality: N/A;  Lumbar Two through Lumbar Five Laminectomies/Cellsaver   PARTIAL HYSTERECTOMY     PERIPHERAL VASCULAR BALLOON ANGIOPLASTY  03/03/2020   Procedure: PERIPHERAL VASCULAR BALLOON ANGIOPLASTY;  Surgeon: Marty Heck, MD;  Location: [place] [facility] LAB;  Service: Cardiovascular;;  Rt arm fistula   PERIPHERAL VASCULAR BALLOON ANGIOPLASTY Right 04/15/2021   Procedure: PERIPHERAL VASCULAR BALLOON ANGIOPLASTY;  Surgeon: Angelia Mould, MD;  Location: [place] CV LAB;  Service: Cardiovascular;  Laterality: Right;  arm fistula   PERIPHERAL VASCULAR BALLOON ANGIOPLASTY  03/14/2022   Procedure: PERIPHERAL VASCULAR BALLOON ANGIOPLASTY;  Surgeon: Broadus John, MD;  Location: [place] CV LAB;  Service: Cardiovascular;;  right arm AVF   PERIPHERAL VASCULAR CATHETERIZATION N/A 11/22/2015   Procedure: Fistulagram;  Surgeon: Angelia Mould, MD;  Location: [place] CV LAB;  Service: Cardiovascular;  Laterality: N/A;   PERIPHERAL VASCULAR INTERVENTION  08/10/2017   Procedure: PERIPHERAL VASCULAR INTERVENTION;  Surgeon: Elam Dutch, MD;  Location: [place] [facility] LAB;  Service: Cardiovascular;;   SHOULDER SURGERY Right    Rotator cuff     Current Outpatient Medications  Medication Sig Dispense Refill   acetaminophen (TYLENOL) 500 MG tablet Take 1,000 mg by mouth every 6 (six) hours as needed for moderate pain or headache.     albuterol (PROVENTIL) (2.5 MG/3ML) 0.083% nebulizer solution Take 3 mLs (2.5 mg total) by nebulization every 6 (six) hours as needed for wheezing or shortness of breath. 75 mL 12   apixaban (ELIQUIS) 5 MG TABS tablet Take 1 tablet (5 mg total) by mouth 2 (two) times daily. 180 tablet 3   aspirin EC 81 MG tablet Take 1 tablet (81 mg total) by mouth daily. Swallow whole. 30 tablet 12   atenolol (TENORMIN)  100 MG tablet Take 1 tablet (100 mg total) by mouth at bedtime. 90 tablet 3   budesonide-formoterol (SYMBICORT) 160-4.5 MCG/ACT inhaler Inhale 2 puffs into the lungs 2 (two) times daily. 3 each 4   calcium acetate (PHOSLO) 667 MG capsule Take 667-1,334 mg by mouth See admin instructions. Take 1334 mg with meals 3 times daily and 667 mg with snacks     Carboxymethylcellul-Glycerin (LUBRICATING  EYE DROPS OP) Place 1 drop into both eyes daily as needed (dry eyes).     cetirizine (ZYRTEC) 10 MG tablet Take 1 tablet (10 mg total) by mouth daily as needed for allergies. 90 tablet 1   diltiazem (CARDIZEM CD) 240 MG 24 hr capsule Take 1 capsule (240 mg total) by mouth daily. 90 capsule 3   Ferrous Fumarate (HEMOCYTE - 106 MG FE) 324 (106 Fe) MG TABS tablet Take 1 tablet (106 mg of iron total) by mouth daily. 30 tablet 11   gabapentin (NEURONTIN) 400 MG capsule Take 2 capsules (800 mg total) by mouth See admin instructions. Take it after HD (Patient taking differently: Take 800 mg by mouth See admin instructions. Take after dialysis) 30 capsule 1   hydrOXYzine (ATARAX) 50 MG tablet Take 50 mg by mouth 3 (three) times daily as needed.     insulin aspart (NOVOLOG FLEXPEN) 100 UNIT/ML FlexPen Inject 1 Units into the skin 3 (three) times daily with meals. < 150: 0 units. 150-199:1 unit. 200-249: 2 units. 250-299: 3 units. 300-349: 4 units. Over 350: 5 units 15 mL 11   insulin degludec (TRESIBA FLEXTOUCH) 100 UNIT/ML FlexTouch Pen Inject 20 Units into the skin at bedtime. Via novo nordisk patient assistance program (Patient taking differently: Inject 30 Units into the skin at bedtime. Via novo nordisk patient assistance program)     multivitamin (RENA-VIT) TABS tablet Take 1 tablet by mouth daily.     nitroGLYCERIN (NITROSTAT) 0.4 MG SL tablet Place 1 tablet (0.4 mg total) under the tongue every 5 (five) minutes as needed for chest pain. 25 tablet 11   ondansetron (ZOFRAN) 4 MG tablet TAKE 1 TABLET BY MOUTH EVERY 8  HOURS AS NEEDED FOR NAUSEA AND VOMITING 20 tablet 5   pantoprazole (PROTONIX) 40 MG tablet Take 1 tablet (40 mg total) by mouth 2 (two) times daily. 120 tablet 0   polyethylene glycol (MIRALAX / GLYCOLAX) 17 g packet Take 17 g by mouth daily as needed for moderate constipation or mild constipation. 14 each 0   QUEtiapine (SEROQUEL) 25 MG tablet Take 3 at bedtime for three days, then two at bedtime for 3 days. Then one a day at bedtime, then DC 18 tablet 0   rOPINIRole (REQUIP) 0.5 MG tablet Take 1 tablet (0.5 mg total) by mouth at bedtime. 30 tablet 0   rosuvastatin (CRESTOR) 5 MG tablet Take 1 tablet (5 mg total) by mouth daily. For cholesterol (Patient taking differently: Take 5 mg by mouth at bedtime. For cholesterol) 90 tablet 2   Semaglutide,0.25 or 0.5MG /DOS, (OZEMPIC, 0.25 OR 0.5 MG/DOSE,) 2 MG/1.5ML SOPN Inject 0.5 mg into the skin every Friday.     torsemide (DEMADEX) 20 MG tablet Take 40-60 mg by mouth daily.     Continuous Blood Gluc Receiver (FREESTYLE LIBRE 2 READER) DEVI USE TO TEST BLOOD SUGAR CONTINUOUSLY 1 each 2   Continuous Blood Gluc Sensor (FREESTYLE LIBRE 2 SENSOR) MISC Use multiple times daily to track glucose to prevent highs and lows as a complication of dialysis Dx:Z99.2, E11.59 2 each 12   glucose blood (ONETOUCH ULTRA) test strip Use to test blood sugar 3 times daily as directed. DX: E11.9 300 each 12   lidocaine-prilocaine (EMLA) cream Apply 1 application. topically as needed (dialysis). (Patient not taking: Reported on 07/12/2022)     No current facility-administered medications for this visit.    Allergies:   Azithromycin, Ace inhibitors, Clopidogrel bisulfate, Codeine, Lisinopril, and Penicillins   ROS:  Please  see the history of present illness.   Otherwise, review of systems are positive for none.   All other systems are reviewed and negative.    PHYSICAL EXAM: VS:  BP (!) 102/58   Pulse 89   Ht 5\' 3"  (1.6 m)   Wt 179 lb (81.2 kg)   BMI 31.71 kg/m  , BMI  Body mass index is 31.71 kg/m. GENERAL:  Well appearing NECK:  No jugular venous distention, waveform within normal limits, carotid upstroke brisk and symmetric, no bruits, no thyromegaly LUNGS:  Clear to auscultation bilaterally CHEST:  Unremarkable HEART:  PMI not displaced or sustained,S1 and S2 within normal limits, no S3, no clicks, no rubs, no murmurs, irregular ABD:  Flat, positive bowel sounds normal in frequency in pitch, no bruits, no rebound, no guarding, no midline pulsatile mass, no hepatomegaly, no splenomegaly EXT:  2 plus pulses upper and decreased dorsalis pedis and posterior tibialis bilateral, no edema, no cyanosis no clubbing.  Right upper arm dialysis fistula pulsatile but without thrill or bruit   EKG:  EKG is not ordered today.  Recent Labs: 03/22/2022: TSH 3.290 03/26/2022: B Natriuretic Peptide 2,276.0 06/10/2022: Magnesium 2.0 06/28/2022: ALT 25; BUN 28; Creatinine, Ser 6.19; Hemoglobin 9.1; Platelets 280; Potassium 5.4; Sodium 141    Lipid Panel    Component Value Date/Time   CHOL 127 06/28/2022 1127   CHOL 241 (H) 03/25/2013 1026   TRIG 100 06/28/2022 1127   TRIG 147 10/09/2013 1527   TRIG 209 (H) 03/25/2013 1026   HDL 42 06/28/2022 1127   HDL 65 10/09/2013 1527   HDL 51 03/25/2013 1026   CHOLHDL 3.0 06/28/2022 1127   CHOLHDL 2.9 03/06/2022 0616   VLDL 19 03/06/2022 0616   LDLCALC 66 06/28/2022 1127   LDLCALC 125 (H) 10/09/2013 1527   LDLCALC 148 (H) 03/25/2013 1026      Wt Readings from Last 3 Encounters:  07/12/22 179 lb (81.2 kg)  07/12/22 178 lb 6.4 oz (80.9 kg)  06/28/22 177 lb 6.4 oz (80.5 kg)    Cardiac cath   Diagnostic Dominance: Right Intervention    Other studies Reviewed: Additional studies/ records that were reviewed today include:   Extensive review of hospital records Review of the above records demonstrates:  Please see elsewhere in the note.     ASSESSMENT AND PLAN:    NSTEMI:   It sounds like she is having no  unstable angina.  She will continue with risk reduction and medical management.  She will continue on the aspirin.     Atrial fib:   I will get her into the atrial fibrillation clinic to begin amiodarone as she is now off the Seroquel.  She might be eventually considered for ablation.  She remains on her Eliquis.   CAD:   The patient has no new sypmtoms.  No further cardiovascular testing is indicated.  We will continue with aggressive risk reduction and meds as listed.  Chronic diastolic HF: Volume is managed in dialysis.  Pericardial effusion: This was small previously and will follow clinically.  Essential hypertension:   She has actually had low blood pressures.  I would like to keep her on the dose of atenolol she is on because of tachyarrhythmia previously but if we can control her rhythm or rate with amiodarone we might be able to back off on that.   Dyslipidemia: LDL was 64 with an HDL of 44.  No change in therapy.   Leg pain: Given this I will  have her do a 4-week trial off Crestor to see if she has any improvement.  Current medicines are reviewed at length with the patient today.  The patient does not have concerns regarding medicines.  The following changes have been made: As above  Labs/ tests ordered today include: None  No orders of the defined types were placed in this encounter.    Disposition:   FU with me 3 months in [place].   Signed, Minus Breeding, MD  07/12/2022 11:56 AM    [place]

## 2022-07-11 ENCOUNTER — Ambulatory Visit: Payer: Self-pay | Admitting: *Deleted

## 2022-07-11 DIAGNOSIS — N250 Renal osteodystrophy: Secondary | ICD-10-CM | POA: Diagnosis not present

## 2022-07-11 DIAGNOSIS — D631 Anemia in chronic kidney disease: Secondary | ICD-10-CM | POA: Diagnosis not present

## 2022-07-11 DIAGNOSIS — N2581 Secondary hyperparathyroidism of renal origin: Secondary | ICD-10-CM | POA: Diagnosis not present

## 2022-07-11 DIAGNOSIS — Z992 Dependence on renal dialysis: Secondary | ICD-10-CM | POA: Diagnosis not present

## 2022-07-11 DIAGNOSIS — N186 End stage renal disease: Secondary | ICD-10-CM | POA: Diagnosis not present

## 2022-07-11 DIAGNOSIS — D509 Iron deficiency anemia, unspecified: Secondary | ICD-10-CM | POA: Diagnosis not present

## 2022-07-11 NOTE — Patient Outreach (Signed)
  Care Coordination   07/11/2022 Name: [name] MRN: [id number] DOB: 1951/11/21   Care Coordination Outreach Attempts:  An unsuccessful telephone outreach was attempted today to offer the patient information about available care coordination services as a benefit of their health plan.   Follow Up Plan:  Additional outreach attempts will be made to offer the patient care coordination information and services.   Encounter Outcome:  No Answer  Care Coordination Interventions Activated:  No   Care Coordination Interventions:  No, not indicated    SIG Marisela Line L. Lavina Hamman, RN, BSN, [place] Coordinator Office number [phone]

## 2022-07-11 NOTE — Patient Outreach (Addendum)
  Care Coordination   Initial Visit Note   07/11/2022 Name: [name] MRN: [id number] DOB: 1951/06/08  [name] is a 71 y.o. year old female who sees [name], [name], MD for primary care. I spoke with  [name] by phone today  What matters to the patients health and wellness today?  Bilateral leg pain and swelling plus CHF and atrial fibrillation   Goals Addressed               This Visit's Progress     Patient Stated     manage CHF to decrease bilateral leg swelling and pain (THN) (pt-stated)   Not on track     Care Coordination Interventions: Basic overview and discussion of pathophysiology of Heart Failure reviewed Provided education on low sodium diet Reviewed Heart Failure Action Plan in depth and provided written copy Assessed need for readable accurate scales in home Provided education about placing scale on hard, flat surface Advised patient to weigh each morning after emptying bladder Discussed importance of daily weight and advised patient to weigh and record daily Reviewed role of diuretics in prevention of fluid overload and management of heart failure; Discussed the importance of keeping all appointments with provider Screening for signs and symptoms of depression related to chronic disease state  Assessed social determinant of health barriers  Discuss importance of home monitoring of foods with salt, potassium and increase fluid intake about ordered 32 ounces. Encouraged elevation, compression hose and calling MD to report weight gain per heart failure action plan. Discussed vessel blockages related her medical conditions Online education sent via my chart for peripheral edema, atrial fibrillation video, heart failure self care, low sodium eating  plan, heart failure and diabetes action plans        SDOH assessments and interventions completed:  Yes  SDOH Interventions Today    Flowsheet Row Most Recent Value  SDOH Interventions   Food  Insecurity Interventions Intervention Not Indicated  Financial Strain Interventions Intervention Not Indicated  Housing Interventions Intervention Not Indicated  Stress Interventions Intervention Not Indicated  Social Connections Interventions Intervention Not Indicated  Transportation Interventions Intervention Not Indicated        Care Coordination Interventions Activated:  Yes  Care Coordination Interventions:  Yes, provided   Follow up plan: Follow up call scheduled for 07/19/22 1030    Encounter Outcome:  Pt. Visit Completed   Braylin Formby L. Lavina Hamman, RN, BSN, [place] Coordinator Office number [phone]

## 2022-07-11 NOTE — Patient Instructions (Addendum)
Visit Information  Thank you for taking time to visit with me today. Please don't hesitate to contact me if I can be of assistance to you.   Following are the goals we discussed today:   Goals Addressed               This Visit's Progress     Patient Stated     manage CHF to decrease bilateral leg swelling and pain (THN) (pt-stated)   Not on track     Care Coordination Interventions: Basic overview and discussion of pathophysiology of Heart Failure reviewed Provided education on low sodium diet Reviewed Heart Failure Action Plan in depth and provided written copy Assessed need for readable accurate scales in home Provided education about placing scale on hard, flat surface Advised patient to weigh each morning after emptying bladder Discussed importance of daily weight and advised patient to weigh and record daily Reviewed role of diuretics in prevention of fluid overload and management of heart failure; Discussed the importance of keeping all appointments with provider Screening for signs and symptoms of depression related to chronic disease state  Assessed social determinant of health barriers  Discuss importance of home monitoring of foods with salt, potassium and increase fluid intake about ordered 32 ounces. Encouraged elevation, compression hose and calling MD to report weight gain per heart failure action plan. Discussed vessel blockages related her medical conditions Online education sent via my chart for peripheral edema, atrial fibrillation video, heart failure self care, low sodium eating  plan, heart failure and diabetes action plans        Our next appointment is by telephone on 07/19/22 at 1030  Please call the care guide team at [phone] if you need to cancel or reschedule your appointment.   If you are experiencing a Mental Health or [place] or need someone to talk to, please call the Suicide and Crisis Lifeline: 988 call the [place] National  Suicide Prevention Lifeline: 7071771800 or TTY: 702-390-8415 TTY 661 434 1710) to talk to a trained counselor call 1-800-273-TALK (toll free, 24 hour hotline) go to [facility] [address] [place] [phone]) call the [place]: [phone] call 911   Patient verbalizes understanding of instructions and care plan provided today and agrees to view in [place]. Active MyChart status and patient understanding of how to access instructions and care plan via MyChart confirmed with patient.     The care management team will reach out to the patient again over the next 7-10 business days.   SIGNATUREKimberly L. Lavina Hamman, RN, BSN, [place] Coordinator Office number [phone]

## 2022-07-11 NOTE — Telephone Encounter (Signed)
I spoke with pt and she took a Hydroxyzine last night to help her sleep. She is willing to do whatever she needs to do to get started on the Amiodarone.   She wants to know how soon she can start the medication?   She has ask me to send her a MyChart message with the medications we recommended or her from a Cardiac stand point so she can speak with her PCP about prescribing them. I will send this to her after I hear back from Pharmacist.

## 2022-07-12 ENCOUNTER — Ambulatory Visit (INDEPENDENT_AMBULATORY_CARE_PROVIDER_SITE_OTHER): Payer: Medicare Other | Admitting: Vascular Surgery

## 2022-07-12 ENCOUNTER — Encounter: Payer: Self-pay | Admitting: Cardiology

## 2022-07-12 ENCOUNTER — Ambulatory Visit (INDEPENDENT_AMBULATORY_CARE_PROVIDER_SITE_OTHER): Payer: Medicare Other | Admitting: Cardiology

## 2022-07-12 ENCOUNTER — Encounter: Payer: Self-pay | Admitting: Vascular Surgery

## 2022-07-12 VITALS — BP 146/76 | HR 93 | Temp 98.1°F | Resp 14 | Ht 63.0 in | Wt 178.4 lb

## 2022-07-12 VITALS — BP 102/58 | HR 89 | Ht 63.0 in | Wt 179.0 lb

## 2022-07-12 DIAGNOSIS — Z992 Dependence on renal dialysis: Secondary | ICD-10-CM | POA: Diagnosis not present

## 2022-07-12 DIAGNOSIS — I2510 Atherosclerotic heart disease of native coronary artery without angina pectoris: Secondary | ICD-10-CM

## 2022-07-12 DIAGNOSIS — I5032 Chronic diastolic (congestive) heart failure: Secondary | ICD-10-CM

## 2022-07-12 DIAGNOSIS — I10 Essential (primary) hypertension: Secondary | ICD-10-CM | POA: Diagnosis not present

## 2022-07-12 DIAGNOSIS — I70219 Atherosclerosis of native arteries of extremities with intermittent claudication, unspecified extremity: Secondary | ICD-10-CM | POA: Diagnosis not present

## 2022-07-12 DIAGNOSIS — I214 Non-ST elevation (NSTEMI) myocardial infarction: Secondary | ICD-10-CM | POA: Diagnosis not present

## 2022-07-12 DIAGNOSIS — I482 Chronic atrial fibrillation, unspecified: Secondary | ICD-10-CM

## 2022-07-12 DIAGNOSIS — I3139 Other pericardial effusion (noninflammatory): Secondary | ICD-10-CM

## 2022-07-12 DIAGNOSIS — N186 End stage renal disease: Secondary | ICD-10-CM | POA: Diagnosis not present

## 2022-07-12 DIAGNOSIS — E785 Hyperlipidemia, unspecified: Secondary | ICD-10-CM

## 2022-07-12 NOTE — Progress Notes (Signed)
Vascular and Vein Specialist of [facility]  Patient name: [name] MRN: [id number] DOB: 08-Mar-1951 Sex: female  REASON FOR VISIT: Evaluate right upper arm brachiocephalic AV fistula  HPI: [name] is a 71 y.o. female here today for evaluation of her right upper arm brachiocephalic fistula.  She reports that this was created approximately 7 years ago.  We have seen her regarding this in our office on several occasions.  She most recently underwent balloon angioplasty with Dr. Unk Lightning of the cephalic subclavian junction on 03/14/2022.  She had cardiac events requiring stenting and I saw her as an inpatient at [facility] on 03/28/2022.  At that time she was having prolonged bleeding from her puncture sites.  He did not feel that there is any issue with the longstanding fistula.  She did have some aneurysmal degeneration but no evidence of skin breakdown.  She was on Brilinta and Eliquis and has had less bleeding recently.  She now reports that she is having pain with cannulation site and also reports that she is having long sessions required.  She does not have any new recent events bleeding  Past Medical History:  Diagnosis Date   Acute on chronic diastolic CHF (congestive heart failure) ([place]) 03/05/2015   Grade 2. EF 60-65%   Anemia    hx of   Anxiety    Arthritis    CAD (coronary artery disease)    s/p Stenting in 2005;  [place] 7/09: Vigorous LV function, 2-3+ MR, RI 40%, RI stent patent, proximal-mid RCA 40-50%;  Echo 7/09:  EF 55-65%, trivial MR;  [place] 11/18/12: EF 66%, normal LV wall motion, mild to moderate anterior ischemia - reviewed by [facility] and felt to rep breast atten (low risk);  Echo 6/14: mild LVH, EF 55-65%, Gr 2 DD, PASP 44, trivial eff    Chronic kidney disease    CREATININE IS UP--GOING TO KIDNEY MD    Chronic neck pain    COPD (chronic obstructive pulmonary disease) (HCC)    Degenerative joint disease    left shoulder    Diabetic neuropathy (HCC)    Diverticulosis    Esophageal stricture    GERD (gastroesophageal reflux disease)    Hyperlipidemia    Hypertension    IDDM (insulin dependent diabetes mellitus)    Pericardial effusion 03/06/2015   Very small per echo ; pleural nodules seen on CT chest.   Peripheral vascular disease ([place])    Stroke (HCC)    "mini- years ago"   Vitamin D deficiency     Family History  Problem Relation Age of Onset   Heart disease Other    Hypertension Father    Cancer Father    Hypertension Mother    Hypertension Daughter    Pancreatitis Brother    Hypertension Daughter    Diabetes Daughter     SOCIAL HISTORY: Social History   Tobacco Use   Smoking status: Former    Packs/day: 1.00    Years: 30.00    Total pack years: 30.00    Types: Cigarettes    Quit date: 12/04/2001    Years since quitting: 20.6    Passive exposure: Never   Smokeless tobacco: Never  Substance Use Topics   Alcohol use: No    Alcohol/week: 6.0 standard drinks of alcohol    Types: 6 Cans of beer per week    Allergies  Allergen Reactions   Azithromycin     Prolonged QT   Ace Inhibitors Cough  Clopidogrel Bisulfate Other (See Comments)    Sick, Headache, "Felt terrible"   Codeine Other (See Comments)    hallucinations   Lisinopril Cough   Penicillins Other (See Comments)    Unknown childhood reaction Did it involve swelling of the face/tongue/throat, SOB, or low BP? Unknown Did it involve sudden or severe rash/hives, skin peeling, or any reaction on the inside of your mouth or nose? Unknown Did you need to seek medical attention at a hospital or doctor's office? Unknown When did it last happen?      childhood allergy If all above answers are "NO", may proceed with cephalosporin use.      Current Outpatient Medications  Medication Sig Dispense Refill   acetaminophen (TYLENOL) 500 MG tablet Take 1,000 mg by mouth every 6 (six) hours as needed for moderate pain or headache.      albuterol (PROVENTIL) (2.5 MG/3ML) 0.083% nebulizer solution Take 3 mLs (2.5 mg total) by nebulization every 6 (six) hours as needed for wheezing or shortness of breath. 75 mL 12   apixaban (ELIQUIS) 5 MG TABS tablet Take 1 tablet (5 mg total) by mouth 2 (two) times daily. 180 tablet 3   aspirin EC 81 MG tablet Take 1 tablet (81 mg total) by mouth daily. Swallow whole. 30 tablet 12   atenolol (TENORMIN) 100 MG tablet Take 1 tablet (100 mg total) by mouth at bedtime. 90 tablet 3   budesonide-formoterol (SYMBICORT) 160-4.5 MCG/ACT inhaler Inhale 2 puffs into the lungs 2 (two) times daily. 3 each 4   calcium acetate (PHOSLO) 667 MG capsule Take 667-1,334 mg by mouth See admin instructions. Take 1334 mg with meals 3 times daily and 667 mg with snacks     Carboxymethylcellul-Glycerin (LUBRICATING EYE DROPS OP) Place 1 drop into both eyes daily as needed (dry eyes).     cetirizine (ZYRTEC) 10 MG tablet Take 1 tablet (10 mg total) by mouth daily as needed for allergies. 90 tablet 1   Continuous Blood Gluc Receiver (FREESTYLE LIBRE 2 READER) DEVI USE TO TEST BLOOD SUGAR CONTINUOUSLY 1 each 2   Continuous Blood Gluc Sensor (FREESTYLE LIBRE 2 SENSOR) MISC Use multiple times daily to track glucose to prevent highs and lows as a complication of dialysis Dx:Z99.2, E11.59 2 each 12   diltiazem (CARDIZEM CD) 240 MG 24 hr capsule Take 1 capsule (240 mg total) by mouth daily. 90 capsule 3   Ferrous Fumarate (HEMOCYTE - 106 MG FE) 324 (106 Fe) MG TABS tablet Take 1 tablet (106 mg of iron total) by mouth daily. 30 tablet 11   gabapentin (NEURONTIN) 400 MG capsule Take 2 capsules (800 mg total) by mouth See admin instructions. Take it after HD (Patient taking differently: Take 800 mg by mouth See admin instructions. Take after dialysis) 30 capsule 1   glucose blood (ONETOUCH ULTRA) test strip Use to test blood sugar 3 times daily as directed. DX: E11.9 300 each 12   insulin aspart (NOVOLOG FLEXPEN) 100 UNIT/ML FlexPen  Inject 1 Units into the skin 3 (three) times daily with meals. < 150: 0 units. 150-199:1 unit. 200-249: 2 units. 250-299: 3 units. 300-349: 4 units. Over 350: 5 units 15 mL 11   insulin degludec (TRESIBA FLEXTOUCH) 100 UNIT/ML FlexTouch Pen Inject 20 Units into the skin at bedtime. Via novo nordisk patient assistance program (Patient taking differently: Inject 30 Units into the skin at bedtime. Via novo nordisk patient assistance program)     multivitamin (RENA-VIT) TABS tablet Take 1 tablet by  mouth daily.     nitroGLYCERIN (NITROSTAT) 0.4 MG SL tablet Place 1 tablet (0.4 mg total) under the tongue every 5 (five) minutes as needed for chest pain. 25 tablet 11   ondansetron (ZOFRAN) 4 MG tablet TAKE 1 TABLET BY MOUTH EVERY 8 HOURS AS NEEDED FOR NAUSEA AND VOMITING 20 tablet 5   pantoprazole (PROTONIX) 40 MG tablet Take 1 tablet (40 mg total) by mouth 2 (two) times daily. 120 tablet 0   polyethylene glycol (MIRALAX / GLYCOLAX) 17 g packet Take 17 g by mouth daily as needed for moderate constipation or mild constipation. 14 each 0   QUEtiapine (SEROQUEL) 25 MG tablet Take 3 at bedtime for three days, then two at bedtime for 3 days. Then one a day at bedtime, then DC 18 tablet 0   rOPINIRole (REQUIP) 0.5 MG tablet Take 1 tablet (0.5 mg total) by mouth at bedtime. 30 tablet 0   rosuvastatin (CRESTOR) 5 MG tablet Take 1 tablet (5 mg total) by mouth daily. For cholesterol (Patient taking differently: Take 5 mg by mouth at bedtime. For cholesterol) 90 tablet 2   Semaglutide,0.25 or 0.5MG /DOS, (OZEMPIC, 0.25 OR 0.5 MG/DOSE,) 2 MG/1.5ML SOPN Inject 0.5 mg into the skin every Friday.     lidocaine-prilocaine (EMLA) cream Apply 1 application. topically as needed (dialysis). (Patient not taking: Reported on 07/12/2022)     No current facility-administered medications for this visit.    REVIEW OF SYSTEMS:  [X]  denotes positive finding, [ ]  denotes negative finding Cardiac  Comments:  Chest pain or chest  pressure:    Shortness of breath upon exertion:    Short of breath when lying flat:    Irregular heart rhythm:        Vascular    Pain in calf, thigh, or hip brought on by ambulation: x   Pain in feet at night that wakes you up from your sleep:     Blood clot in your veins:    Leg swelling:           PHYSICAL EXAM: Vitals:   07/12/22 0829  BP: (!) 146/76  Pulse: 93  Resp: 14  Temp: 98.1 F (36.7 C)  TempSrc: Temporal  SpO2: 93%  Weight: 178 lb 6.4 oz (80.9 kg)  Height: 5\' 3"  (1.6 m)    GENERAL: The patient is a well-nourished female, in no acute distress. The vital signs are documented above. CARDIOVASCULAR: Patient right upper arm AV fistula.  She does have good thrill.  There is aneurysmal change in the midportion of her fistula.  She has very 1 very small area of eschar but does not appear to be deep or at risk for significant bleed.  Skin is otherwise completely intact.  She has a approximately 7 to 8 mm vein above the antecubital space and also in her upper arm above this aneurysmal fistula change. PULMONARY: There is good air exchange  MUSCULOSKELETAL: There are no major deformities or cyanosis. NEUROLOGIC: No focal weakness or paresthesias are detected. SKIN: There are no ulcers or rashes noted. PSYCHIATRIC: The patient has a normal affect.  DATA:  Plan images with SonoSite ultrasound she does not have any evidence of any narrowing in the vein above or below the aneurysmal change  MEDICAL ISSUES: Had a long discussion with the patient regarding her fistula.  I do not see any changes that would make it unacceptable for continued use.  I did explain that she has had very good long-term result and that eventually  her fistula will require conversion to a right upper arm AV graft.  I do not see any absolute indication for this currently.  I explained that if she continues to have pain with cannulation and difficulty with long runs that she would be better suited with a graft.   I would delay this as long as possible as long as she is having persistent pain or difficulty with cannulation and access runs.  I did explain that she will require placement of a new right upper arm AV graft and would require a tunneled hemodialysis catheter for approximately 1 month until the graft was healed.  She would then have use of the graft and removal of the catheter after several successful uses.  She is quite hesitant to have a tunneled catheter placed.  Safe to continue use as long as possible fistula.  She will see [place] again on needed basis    Rosetta Posner, MD FACS [facility] Tel [phone]  Note: Portions of this report may have been transcribed using voice recognition software.  Every effort has been made to ensure accuracy; however, inadvertent computerized transcription errors may still be present.

## 2022-07-12 NOTE — Patient Instructions (Signed)
Medication Instructions:  Please hold your Crestor for 1 month. Continue all other medications as listed.  *If you need a refill on your cardiac medications before your next appointment, please call your pharmacy*  AFIB CLINIC INFORMATION: Your appointment is scheduled on: 08/02/22 at 10:30 am. Please arrive 15 minutes early for check-in. The AFib Clinic is located in the Heart and Vascular Specialty Clinics at [facility]. Parking instructions/directions: [address] (off [address]). When you pull in to Entrance C, there is an underground parking garage to your right. The code to enter the garage is 1403. Take the elevators to the first floor. Follow the signs to the Heart and Vascular Specialty Clinics. You will see registration at the end of the hallway.  Phone number: [phone]   Follow-Up: At [facility], you and your health needs are our priority.  As part of our continuing mission to provide you with exceptional heart care, we have created designated Provider Care Teams.  These Care Teams include your primary Cardiologist (physician) and Advanced Practice Providers (APPs -  Physician Assistants and Nurse Practitioners) who all work together to provide you with the care you need, when you need it.  We recommend signing up for the patient portal called "MyChart".  Sign up information is provided on this After Visit Summary.  MyChart is used to connect with patients for Virtual Visits (Telemedicine).  Patients are able to view lab/test results, encounter notes, upcoming appointments, etc.  Non-urgent messages can be sent to your provider as well.   To learn more about what you can do with MyChart, go to [email or web address].    Your next appointment:   3 month(s)  The format for your next appointment:   In Person  Provider:   Minus Breeding, MD{  Important Information About Sugar

## 2022-07-13 DIAGNOSIS — N2581 Secondary hyperparathyroidism of renal origin: Secondary | ICD-10-CM | POA: Diagnosis not present

## 2022-07-13 DIAGNOSIS — N250 Renal osteodystrophy: Secondary | ICD-10-CM | POA: Diagnosis not present

## 2022-07-13 DIAGNOSIS — D509 Iron deficiency anemia, unspecified: Secondary | ICD-10-CM | POA: Diagnosis not present

## 2022-07-13 DIAGNOSIS — N186 End stage renal disease: Secondary | ICD-10-CM | POA: Diagnosis not present

## 2022-07-13 DIAGNOSIS — Z992 Dependence on renal dialysis: Secondary | ICD-10-CM | POA: Diagnosis not present

## 2022-07-13 DIAGNOSIS — D631 Anemia in chronic kidney disease: Secondary | ICD-10-CM | POA: Diagnosis not present

## 2022-07-14 DIAGNOSIS — I5033 Acute on chronic diastolic (congestive) heart failure: Secondary | ICD-10-CM | POA: Diagnosis not present

## 2022-07-14 DIAGNOSIS — I4821 Permanent atrial fibrillation: Secondary | ICD-10-CM | POA: Diagnosis not present

## 2022-07-14 DIAGNOSIS — E1159 Type 2 diabetes mellitus with other circulatory complications: Secondary | ICD-10-CM | POA: Diagnosis not present

## 2022-07-14 DIAGNOSIS — D62 Acute posthemorrhagic anemia: Secondary | ICD-10-CM | POA: Diagnosis not present

## 2022-07-14 DIAGNOSIS — I152 Hypertension secondary to endocrine disorders: Secondary | ICD-10-CM | POA: Diagnosis not present

## 2022-07-14 DIAGNOSIS — E1140 Type 2 diabetes mellitus with diabetic neuropathy, unspecified: Secondary | ICD-10-CM | POA: Diagnosis not present

## 2022-07-15 DIAGNOSIS — N2581 Secondary hyperparathyroidism of renal origin: Secondary | ICD-10-CM | POA: Diagnosis not present

## 2022-07-15 DIAGNOSIS — D509 Iron deficiency anemia, unspecified: Secondary | ICD-10-CM | POA: Diagnosis not present

## 2022-07-15 DIAGNOSIS — N186 End stage renal disease: Secondary | ICD-10-CM | POA: Diagnosis not present

## 2022-07-15 DIAGNOSIS — N250 Renal osteodystrophy: Secondary | ICD-10-CM | POA: Diagnosis not present

## 2022-07-15 DIAGNOSIS — D631 Anemia in chronic kidney disease: Secondary | ICD-10-CM | POA: Diagnosis not present

## 2022-07-15 DIAGNOSIS — Z992 Dependence on renal dialysis: Secondary | ICD-10-CM | POA: Diagnosis not present

## 2022-07-17 ENCOUNTER — Encounter (HOSPITAL_COMMUNITY): Payer: Self-pay

## 2022-07-17 ENCOUNTER — Ambulatory Visit (HOSPITAL_COMMUNITY): Admit: 2022-07-17 | Payer: Medicare Other | Admitting: Cardiology

## 2022-07-17 SURGERY — CARDIOVERSION
Anesthesia: General

## 2022-07-18 DIAGNOSIS — D509 Iron deficiency anemia, unspecified: Secondary | ICD-10-CM | POA: Diagnosis not present

## 2022-07-18 DIAGNOSIS — N186 End stage renal disease: Secondary | ICD-10-CM | POA: Diagnosis not present

## 2022-07-18 DIAGNOSIS — Z992 Dependence on renal dialysis: Secondary | ICD-10-CM | POA: Diagnosis not present

## 2022-07-18 DIAGNOSIS — D631 Anemia in chronic kidney disease: Secondary | ICD-10-CM | POA: Diagnosis not present

## 2022-07-18 DIAGNOSIS — N250 Renal osteodystrophy: Secondary | ICD-10-CM | POA: Diagnosis not present

## 2022-07-18 DIAGNOSIS — N2581 Secondary hyperparathyroidism of renal origin: Secondary | ICD-10-CM | POA: Diagnosis not present

## 2022-07-20 DIAGNOSIS — D509 Iron deficiency anemia, unspecified: Secondary | ICD-10-CM | POA: Diagnosis not present

## 2022-07-20 DIAGNOSIS — Z992 Dependence on renal dialysis: Secondary | ICD-10-CM | POA: Diagnosis not present

## 2022-07-20 DIAGNOSIS — N186 End stage renal disease: Secondary | ICD-10-CM | POA: Diagnosis not present

## 2022-07-20 DIAGNOSIS — N250 Renal osteodystrophy: Secondary | ICD-10-CM | POA: Diagnosis not present

## 2022-07-20 DIAGNOSIS — D631 Anemia in chronic kidney disease: Secondary | ICD-10-CM | POA: Diagnosis not present

## 2022-07-20 DIAGNOSIS — N2581 Secondary hyperparathyroidism of renal origin: Secondary | ICD-10-CM | POA: Diagnosis not present

## 2022-07-22 DIAGNOSIS — Z992 Dependence on renal dialysis: Secondary | ICD-10-CM | POA: Diagnosis not present

## 2022-07-22 DIAGNOSIS — N2581 Secondary hyperparathyroidism of renal origin: Secondary | ICD-10-CM | POA: Diagnosis not present

## 2022-07-22 DIAGNOSIS — D631 Anemia in chronic kidney disease: Secondary | ICD-10-CM | POA: Diagnosis not present

## 2022-07-22 DIAGNOSIS — N186 End stage renal disease: Secondary | ICD-10-CM | POA: Diagnosis not present

## 2022-07-22 DIAGNOSIS — D509 Iron deficiency anemia, unspecified: Secondary | ICD-10-CM | POA: Diagnosis not present

## 2022-07-22 DIAGNOSIS — N250 Renal osteodystrophy: Secondary | ICD-10-CM | POA: Diagnosis not present

## 2022-07-24 ENCOUNTER — Telehealth: Payer: Self-pay | Admitting: Cardiology

## 2022-07-24 NOTE — Telephone Encounter (Signed)
Spoke with pt, aware of the results. She continues to have rest and pain with exercise. She was encouraged to walk as much as possible.

## 2022-07-24 NOTE — Telephone Encounter (Signed)
Left message to call back  

## 2022-07-24 NOTE — Telephone Encounter (Signed)
Patient is returning call to discuss LE ART results.

## 2022-07-25 DIAGNOSIS — N186 End stage renal disease: Secondary | ICD-10-CM | POA: Diagnosis not present

## 2022-07-25 DIAGNOSIS — N250 Renal osteodystrophy: Secondary | ICD-10-CM | POA: Diagnosis not present

## 2022-07-25 DIAGNOSIS — Z992 Dependence on renal dialysis: Secondary | ICD-10-CM | POA: Diagnosis not present

## 2022-07-25 DIAGNOSIS — D631 Anemia in chronic kidney disease: Secondary | ICD-10-CM | POA: Diagnosis not present

## 2022-07-25 DIAGNOSIS — N2581 Secondary hyperparathyroidism of renal origin: Secondary | ICD-10-CM | POA: Diagnosis not present

## 2022-07-25 DIAGNOSIS — D509 Iron deficiency anemia, unspecified: Secondary | ICD-10-CM | POA: Diagnosis not present

## 2022-07-26 ENCOUNTER — Other Ambulatory Visit: Payer: Self-pay | Admitting: Family Medicine

## 2022-07-26 NOTE — Telephone Encounter (Signed)
Updated result note

## 2022-07-27 ENCOUNTER — Encounter: Payer: Self-pay | Admitting: Family Medicine

## 2022-07-27 ENCOUNTER — Ambulatory Visit (INDEPENDENT_AMBULATORY_CARE_PROVIDER_SITE_OTHER): Payer: Medicare Other | Admitting: Family Medicine

## 2022-07-27 ENCOUNTER — Telehealth: Payer: Self-pay | Admitting: *Deleted

## 2022-07-27 VITALS — BP 139/60 | HR 89 | Temp 97.5°F | Ht 63.0 in | Wt 176.5 lb

## 2022-07-27 DIAGNOSIS — B029 Zoster without complications: Secondary | ICD-10-CM

## 2022-07-27 DIAGNOSIS — B0229 Other postherpetic nervous system involvement: Secondary | ICD-10-CM | POA: Diagnosis not present

## 2022-07-27 DIAGNOSIS — D509 Iron deficiency anemia, unspecified: Secondary | ICD-10-CM | POA: Diagnosis not present

## 2022-07-27 DIAGNOSIS — N2581 Secondary hyperparathyroidism of renal origin: Secondary | ICD-10-CM | POA: Diagnosis not present

## 2022-07-27 DIAGNOSIS — N250 Renal osteodystrophy: Secondary | ICD-10-CM | POA: Diagnosis not present

## 2022-07-27 DIAGNOSIS — D631 Anemia in chronic kidney disease: Secondary | ICD-10-CM | POA: Diagnosis not present

## 2022-07-27 DIAGNOSIS — N186 End stage renal disease: Secondary | ICD-10-CM | POA: Diagnosis not present

## 2022-07-27 DIAGNOSIS — Z992 Dependence on renal dialysis: Secondary | ICD-10-CM | POA: Diagnosis not present

## 2022-07-27 MED ORDER — LIDOCAINE 5 % EX PTCH: 1.0000 | MEDICATED_PATCH | CUTANEOUS | 0 refills | Status: DC

## 2022-07-27 NOTE — Progress Notes (Signed)
   Acute Office Visit  Subjective:     Patient ID: [name], female    DOB: Dec 27, 1950, 71 y.o.   MRN: [id number]  Chief Complaint  Patient presents with   Rash    Rash The current episode started 1 to 4 weeks ago. The problem has been gradually worsening since onset. Location: left thoracic. The rash is characterized by blistering, burning and pain. She was exposed to nothing. Pertinent negatives include no congestion, cough, diarrhea, fatigue, rhinorrhea, shortness of breath, sore throat or vomiting. Past treatments include antibiotic cream. The treatment provided mild relief. Her past medical history is significant for varicella. There is no history of allergies, asthma or eczema.    Review of Systems  Constitutional:  Negative for fatigue.  HENT:  Negative for congestion, rhinorrhea and sore throat.   Respiratory:  Negative for cough and shortness of breath.   Gastrointestinal:  Negative for diarrhea and vomiting.  Skin:  Positive for rash.        Objective:    BP 139/60   Pulse 89   Temp (!) 97.5 F (36.4 C) (Temporal)   Ht 5\' 3"  (1.6 m)   Wt 176 lb 8 oz (80.1 kg)   SpO2 96%   BMI 31.27 kg/m    Physical Exam Vitals and nursing note reviewed.  Constitutional:      General: She is not in acute distress.    Appearance: She is not ill-appearing, toxic-appearing or diaphoretic.  Cardiovascular:     Rate and Rhythm: Normal rate and regular rhythm.     Heart sounds: Normal heart sounds. No murmur heard. Pulmonary:     Effort: Pulmonary effort is normal. No respiratory distress.     Breath sounds: Normal breath sounds.  Musculoskeletal:     Right lower leg: No edema.     Left lower leg: No edema.  Skin:    Findings: Rash present. Rash is vesicular (right mid thoracic- 7 crusted lesions. No exudate. No surrounding erythema, warmth, or swelling).  Neurological:     Mental Status: She is alert and oriented to person, place, and time.  Psychiatric:        Mood  and Affect: Mood normal.        Behavior: Behavior normal.     No results found for any visits on 07/27/22.      Assessment & Plan:   Kimaya was seen today for rash.  Diagnoses and all orders for this visit:  Herpes zoster without complication Outside of window for antiviral treatment as symptoms have been present for over 1 week. Lesions are now crusted. No signs of infection. Discussed symptomatic care. On gabapentin for neuropathy already. Can try lidocaine patch.   Post herpetic neuralgia -     lidocaine (LIDODERM) 5 %; Place 1 patch onto the skin daily. Remove & Discard patch within 12 hours or as directed by MD  Follow up with PCP as needed.   The patient indicates understanding of these issues and agrees with the plan.  Gwenlyn Perking, FNP

## 2022-07-27 NOTE — Telephone Encounter (Signed)
   Pre-operative Risk Assessment    Patient Name: [name]  DOB: [birth year] MRN: [id number]      Request for Surgical Clearance    Procedure:   RIGHT UPPER ARM AV GRAFT & INSERTION OF TUNNELED DIALYSIS CATHETER  Date of Surgery:  Clearance TBD                                 Surgeon:  DR. TODD EARLY Surgeon's Group or Practice Name:  [facility] Phone number:  [phone] Fax number:  [phone]   Type of Clearance Requested:   - Pharmacy:  Hold Apixaban (Eliquis) 2-3 DAYS PRIOR   Type of Anesthesia:  Not Indicated   Additional requests/questions:    Signed, Jeanann Lewandowsky   07/27/2022, 7:15 AM

## 2022-07-27 NOTE — Telephone Encounter (Signed)
Patient with diagnosis of afib on Eliquis for anticoagulation.    Procedure:  RIGHT UPPER ARM AV GRAFT & INSERTION OF TUNNELED DIALYSIS CATHETER Date of procedure: TBD   CHA2DS2-VASc Score = 8   This indicates a 10.8% annual risk of stroke. [name] score is based upon: CHF History: 1 HTN History: 1 Diabetes History: 1 Stroke History: 2 Vascular Disease History: 1 Age Score: 1 Gender Score: 1      Patient is an ESRD pt on HD.   Patient does have a hx of stroke.   Per office protocol, patient can hold Eliquis for 2 days prior to procedure.    **This guidance is not considered finalized until pre-operative APP has relayed final recommendations.**

## 2022-07-27 NOTE — Patient Instructions (Signed)
Shingles  Shingles, which is also known as herpes zoster, is an infection that causes a painful skin rash and fluid-filled blisters. It is caused by a virus. Shingles only develops in people who: Have had chickenpox. Have been vaccinated against chickenpox. Shingles is rare in this group. What are the causes? Shingles is caused by varicella-zoster virus. This is the same virus that causes chickenpox. After a person is exposed to the virus, it stays in the body in an inactive (dormant) state. Shingles develops if the virus is reactivated. This can happen many years after the first (initial) exposure to the virus. It is not known what causes this virus to be reactivated. What increases the risk? People who have had chickenpox or received the chickenpox vaccine are at risk for shingles. Shingles infection is more common in people who: Are older than 70 years of age. Have a weakened disease-fighting system (immune system), such as people with: HIV (human immunodeficiency virus). AIDS (acquired immunodeficiency syndrome). Cancer. Are taking medicines that weaken the immune system, such as organ transplant medicines. Are experiencing a lot of stress. What are the signs or symptoms? Early symptoms of this condition include itching, tingling, and pain in an area on your skin. Pain may be described as burning, stabbing, or throbbing. A few days or weeks after early symptoms start, a painful red rash appears. The rash is usually on one side of the body and has a band-like or belt-like pattern. The rash eventually turns into fluid-filled blisters that break open, change into scabs, and dry up in about 2-3 weeks. At any time during the infection, you may also develop: A fever. Chills. A headache. Nausea. How is this diagnosed? This condition is diagnosed with a skin exam. Skin or fluid samples (a culture) may be taken from the blisters before a diagnosis is made. How is this treated? The rash may  last for several weeks. There is not a specific cure for this condition. Your health care provider may prescribe medicines to help you manage pain, recover more quickly, and avoid long-term problems. Medicines may include: Antiviral medicines. Anti-inflammatory medicines. Pain medicines. Anti-itching medicines (antihistamines). If the area involved is on your face, you may be referred to a specialist, such as an eye doctor (ophthalmologist) or an ear, nose, and throat (ENT) doctor (otorhinolaryngologist) to help you avoid eye problems, chronic pain, or disability. Follow these instructions at home: Medicines Take over-the-counter and prescription medicines only as told by your health care provider. Apply an anti-itch cream or numbing cream to the affected area as told by your health care provider. Relieving itching and discomfort  Apply cold, wet cloths (cold compresses) to the area of the rash or blisters as told by your health care provider. Cool baths can be soothing. Try adding baking soda or dry oatmeal to the water to reduce itching. Do not bathe in hot water. Use calamine lotion as recommended by your health care provider. This is an over-the-counter lotion that helps to relieve itchiness. Blister and rash care Keep your rash covered with a loose bandage (dressing). Wear loose-fitting clothing to help ease the pain of material rubbing against the rash. Wash your hands with soap and water for at least 20 seconds before and after you change your dressing. If soap and water are not available, use hand sanitizer. Change your dressing as told by your health care provider. Keep your rash and blisters clean by washing the area with mild soap and cool water as told by your   health care provider. Check your rash every day for signs of infection. Check for: More redness, swelling, or pain. Fluid or blood. Warmth. Pus or a bad smell. Do not scratch your rash or pick at your blisters. To help  avoid scratching: Keep your fingernails clean and cut short. Wear gloves or mittens while you sleep, if scratching is a problem. General instructions Rest as told by your health care provider. Wash your hands often with soap and water for at least 20 seconds. If soap and water are not available, use hand sanitizer. Doing this lowers your chance of getting a bacterial skin infection. Before your blisters change into scabs, your shingles infection can cause chickenpox in people who have never had it or have never been vaccinated against it. To prevent this from happening, avoid contact with other people, especially: Babies. Pregnant women. Children who have eczema. Older people who have transplants. People who have chronic illnesses, such as cancer or AIDS. Keep all follow-up visits. This is important. How is this prevented? Getting vaccinated is the best way to prevent shingles and protect against shingles complications. If you have not been vaccinated, talk with your health care provider about getting the vaccine. Where to find more information Centers for Disease Control and Prevention: www.cdc.gov Contact a health care provider if: Your pain is not relieved with prescribed medicines. Your pain does not get better after the rash heals. You have any of these signs of infection: More redness, swelling, or pain around the rash. Fluid or blood coming from the rash. Warmth coming from your rash. Pus or a bad smell coming from the rash. A fever. Get help right away if: The rash is on your face or nose. You have facial pain, pain around your eye area, or loss of feeling on one side of your face. You have difficulty seeing. You have ear pain or have ringing in your ear. You have a loss of taste. Your condition gets worse. Summary Shingles, also known as herpes zoster, is an infection that causes a painful skin rash and fluid-filled blisters. This condition is diagnosed with a skin exam.  Skin or fluid samples (a culture) may be taken from the blisters. Keep your rash covered with a loose bandage (dressing). Wear loose-fitting clothing to help ease the pain of material rubbing against the rash. Before your blisters change into scabs, your shingles infection can cause chickenpox in people who have never had it or have never been vaccinated against it. This information is not intended to replace advice given to you by your health care provider. Make sure you discuss any questions you have with your health care provider. Document Revised: 11/15/2020 Document Reviewed: 11/15/2020 [employer] Patient Education  2023 [employer]  

## 2022-07-28 ENCOUNTER — Encounter: Payer: Self-pay | Admitting: Family Medicine

## 2022-07-28 NOTE — Telephone Encounter (Signed)
   Name: [name]  DOB: February 12, 1951  MRN: [id number]   Primary Cardiologist: Minus Breeding, MD  Chart reviewed as part of pre-operative protocol coverage.   Per our office protocol, patient can hold Eliquis x2 days prior to the procedure.  Please restart when medically safe to do so.  Medical clearance was not requested.  I will route this recommendation to the requesting party via Epic fax function and remove from pre-op pool. Please call with questions.  Elgie Collard, PA-C 07/28/2022, 3:50 PM

## 2022-07-29 DIAGNOSIS — N250 Renal osteodystrophy: Secondary | ICD-10-CM | POA: Diagnosis not present

## 2022-07-29 DIAGNOSIS — N186 End stage renal disease: Secondary | ICD-10-CM | POA: Diagnosis not present

## 2022-07-29 DIAGNOSIS — Z992 Dependence on renal dialysis: Secondary | ICD-10-CM | POA: Diagnosis not present

## 2022-07-29 DIAGNOSIS — N2581 Secondary hyperparathyroidism of renal origin: Secondary | ICD-10-CM | POA: Diagnosis not present

## 2022-07-29 DIAGNOSIS — D631 Anemia in chronic kidney disease: Secondary | ICD-10-CM | POA: Diagnosis not present

## 2022-07-29 DIAGNOSIS — D509 Iron deficiency anemia, unspecified: Secondary | ICD-10-CM | POA: Diagnosis not present

## 2022-08-01 DIAGNOSIS — D631 Anemia in chronic kidney disease: Secondary | ICD-10-CM | POA: Diagnosis not present

## 2022-08-01 DIAGNOSIS — N2581 Secondary hyperparathyroidism of renal origin: Secondary | ICD-10-CM | POA: Diagnosis not present

## 2022-08-01 DIAGNOSIS — Z992 Dependence on renal dialysis: Secondary | ICD-10-CM | POA: Diagnosis not present

## 2022-08-01 DIAGNOSIS — D509 Iron deficiency anemia, unspecified: Secondary | ICD-10-CM | POA: Diagnosis not present

## 2022-08-01 DIAGNOSIS — N250 Renal osteodystrophy: Secondary | ICD-10-CM | POA: Diagnosis not present

## 2022-08-01 DIAGNOSIS — N186 End stage renal disease: Secondary | ICD-10-CM | POA: Diagnosis not present

## 2022-08-02 ENCOUNTER — Telehealth: Payer: Self-pay

## 2022-08-02 ENCOUNTER — Ambulatory Visit (HOSPITAL_COMMUNITY)
Admission: RE | Admit: 2022-08-02 | Discharge: 2022-08-02 | Disposition: A | Discharge status: Home / Self Care | Payer: Medicare Other | Source: Ambulatory Visit | Attending: Nurse Practitioner | Admitting: Nurse Practitioner

## 2022-08-02 VITALS — BP 140/52 | HR 97 | Ht 63.0 in | Wt 178.6 lb

## 2022-08-02 DIAGNOSIS — Z794 Long term (current) use of insulin: Secondary | ICD-10-CM | POA: Insufficient documentation

## 2022-08-02 DIAGNOSIS — I2510 Atherosclerotic heart disease of native coronary artery without angina pectoris: Secondary | ICD-10-CM | POA: Diagnosis not present

## 2022-08-02 DIAGNOSIS — I5032 Chronic diastolic (congestive) heart failure: Secondary | ICD-10-CM | POA: Diagnosis not present

## 2022-08-02 DIAGNOSIS — N186 End stage renal disease: Secondary | ICD-10-CM | POA: Diagnosis not present

## 2022-08-02 DIAGNOSIS — Z992 Dependence on renal dialysis: Secondary | ICD-10-CM | POA: Diagnosis not present

## 2022-08-02 DIAGNOSIS — Z7901 Long term (current) use of anticoagulants: Secondary | ICD-10-CM | POA: Diagnosis not present

## 2022-08-02 DIAGNOSIS — D6869 Other thrombophilia: Secondary | ICD-10-CM

## 2022-08-02 DIAGNOSIS — I110 Hypertensive heart disease with heart failure: Secondary | ICD-10-CM | POA: Diagnosis not present

## 2022-08-02 DIAGNOSIS — E119 Type 2 diabetes mellitus without complications: Secondary | ICD-10-CM | POA: Diagnosis not present

## 2022-08-02 DIAGNOSIS — J449 Chronic obstructive pulmonary disease, unspecified: Secondary | ICD-10-CM | POA: Diagnosis not present

## 2022-08-02 DIAGNOSIS — I639 Cerebral infarction, unspecified: Secondary | ICD-10-CM | POA: Insufficient documentation

## 2022-08-02 DIAGNOSIS — I480 Paroxysmal atrial fibrillation: Secondary | ICD-10-CM | POA: Diagnosis not present

## 2022-08-02 DIAGNOSIS — I4819 Other persistent atrial fibrillation: Secondary | ICD-10-CM | POA: Diagnosis not present

## 2022-08-02 LAB — TSH: TSH: 4.65 u[IU]/mL — ABNORMAL HIGH (ref 0.350–4.500)

## 2022-08-02 MED ORDER — AMIODARONE HCL 200 MG PO TABS: ORAL_TABLET | ORAL | 0 refills | Status: DC

## 2022-08-02 MED ORDER — ATENOLOL 100 MG PO TABS: 50.0000 mg | ORAL_TABLET | Freq: Every day | ORAL | 3 refills | Status: AC

## 2022-08-02 NOTE — Patient Instructions (Signed)
Stop hydroxyzine  Decrease atenolol to 50mg  once a day at bedtime (1/2 of the 100mg  tablet)   Start Amiodarone 200mg  -- take 1 tablet twice a day for 1 month then will reduce to 1 tablet daily. Take with food

## 2022-08-02 NOTE — Telephone Encounter (Signed)
LM for pt to return call to discuss medications.

## 2022-08-03 ENCOUNTER — Other Ambulatory Visit: Payer: Self-pay

## 2022-08-03 DIAGNOSIS — D631 Anemia in chronic kidney disease: Secondary | ICD-10-CM | POA: Diagnosis not present

## 2022-08-03 DIAGNOSIS — N2581 Secondary hyperparathyroidism of renal origin: Secondary | ICD-10-CM | POA: Diagnosis not present

## 2022-08-03 DIAGNOSIS — N250 Renal osteodystrophy: Secondary | ICD-10-CM | POA: Diagnosis not present

## 2022-08-03 DIAGNOSIS — N186 End stage renal disease: Secondary | ICD-10-CM

## 2022-08-03 DIAGNOSIS — Z992 Dependence on renal dialysis: Secondary | ICD-10-CM | POA: Diagnosis not present

## 2022-08-03 DIAGNOSIS — D509 Iron deficiency anemia, unspecified: Secondary | ICD-10-CM | POA: Diagnosis not present

## 2022-08-03 NOTE — Progress Notes (Signed)
Primary Care Physician: [name], MD Referring Physician: Dr. Celedonio Miyamoto is a 71 y.o. female with a h/o  diastolic heart failure, coronary artery disease, end-stage renal disease on dialysis, COPD, insulin-dependent diabetes, CVA.  She also has persistent atrial fibrillation.  She feels weak and fatigued due to her atrial fibrillation.  She had a cardioversion 04/24/2022 and felt improved after her cardioversion, but unfortunately quickly went back into atrial fibrillation.    April 2023, she had an echo that showed a normal ejection fraction with elevated right atrial pressure and severe right ventricular dysfunction.  She had a left heart catheterization that showed RCA and LAD disease and had coronary atherectomy performed.  She represented to the hospital with GI bleeding.  She has had capsule endoscopy and was started on Protonix  She was seen in consult with Dr. Curt Bears 7/24. He wanted to start amiodarone as he felt that  she was not an optimal ablation candidate. However, she was on Seroquel and this needed to be stopped prior to start of amiodarone.  She is now off Seroquel for several weeks. It was suggested that she could use Cymbalta but she has not contacted PCP to start. She states she feels ok off Seroquel. I discussed risk vrs benefit of amiodarone and she is now willing to start. She has also been taking some hydroxyzine ans is aware that she will  not be able to use this going forward with amio. Qtc today 462 ms.   Today, she denies symptoms of palpitations, chest pain, shortness of breath, orthopnea, PND, lower extremity edema, dizziness, presyncope, syncope, or neurologic sequela. The patient is tolerating medications without difficulties and is otherwise without complaint today.   Past Medical History:  Diagnosis Date   Acute on chronic diastolic CHF (congestive heart failure) ([place]) 03/05/2015   Grade 2. EF 60-65%   Anemia    hx of   Anxiety     Arthritis    CAD (coronary artery disease)    s/p Stenting in 2005;  [place] 7/09: Vigorous LV function, 2-3+ MR, RI 40%, RI stent patent, proximal-mid RCA 40-50%;  Echo 7/09:  EF 55-65%, trivial MR;  [place] 11/18/12: EF 66%, normal LV wall motion, mild to moderate anterior ischemia - reviewed by [facility] and felt to rep breast atten (low risk);  Echo 6/14: mild LVH, EF 55-65%, Gr 2 DD, PASP 44, trivial eff    Chronic kidney disease    CREATININE IS UP--GOING TO KIDNEY MD    Chronic neck pain    COPD (chronic obstructive pulmonary disease) (HCC)    Degenerative joint disease    left shoulder   Diabetic neuropathy (HCC)    Diverticulosis    Esophageal stricture    GERD (gastroesophageal reflux disease)    Hyperlipidemia    Hypertension    IDDM (insulin dependent diabetes mellitus)    Pericardial effusion 03/06/2015   Very small per echo ; pleural nodules seen on CT chest.   Peripheral vascular disease ([place])    Stroke ([place])    "mini- years ago"   Vitamin D deficiency    Past Surgical History:  Procedure Laterality Date   A/V FISTULAGRAM N/A 08/10/2017   Procedure: A/V Fistulagram - Right Arm;  Surgeon: Elam Dutch, MD;  Location: [place] CV LAB;  Service: Cardiovascular;  Laterality: N/A;   A/V FISTULAGRAM N/A 03/03/2020   Procedure: A/V FISTULAGRAM - Right Arm;  Surgeon: Marty Heck, MD;  Location: [facility]  INVASIVE CV LAB;  Service: Cardiovascular;  Laterality: N/A;   ABDOMINAL HYSTERECTOMY     AV FISTULA PLACEMENT Right 07/20/2015   Procedure: RADIAL CEPHALIC ARTERIOVENOUS FISTULA CREATION RIGHT ARM;  Surgeon: Angelia Mould, MD;  Location: [place];  Service: Vascular;  Laterality: Right;   AV FISTULA PLACEMENT Right 11/26/2015   Procedure:  Creation of RIGHT BRACHIO-CEPHALIC arteriovenous fistula;  Surgeon: Angelia Mould, MD;  Location: [facility] OR;  Service: Vascular;  Laterality: Right;   BACK SURGERY     2007 & 2013   [place]   2003, 2005, 2009   1 stent   cardiac stents     CARDIOVERSION N/A 04/24/2022   Procedure: CARDIOVERSION;  Surgeon: Fay Records, MD;  Location: [facility] ENDOSCOPY;  Service: Cardiovascular;  Laterality: N/A;   CATARACT EXTRACTION W/PHACO Right 05/04/2014   Procedure: CATARACT EXTRACTION PHACO AND INTRAOCULAR LENS PLACEMENT ([place]);  Surgeon: Williams Che, MD;  Location: [facility];  Service: Ophthalmology;  Laterality: Right;  CDE 1.47   CATARACT EXTRACTION W/PHACO Left 07/20/2014   Procedure: CATARACT EXTRACTION WITH PHACO AND INTRAOCULAR LENS PLACEMENT LEFT EYE CDE=1.79;  Surgeon: Williams Che, MD;  Location: [facility];  Service: Ophthalmology;  Laterality: Left;   CERVICAL SPINE SURGERY  2012   8 screws   CORONARY ATHERECTOMY N/A 03/08/2022   Procedure: CORONARY ATHERECTOMY;  Surgeon: Sherren Mocha, MD;  Location: [place] CV LAB;  Service: Cardiovascular;  Laterality: N/A;   CYSTOSCOPY WITH INJECTION N/A 06/03/2013   Procedure: MACROPLASTIQUE  INJECTION ;  Surgeon: Malka So, MD;  Location: [facility] ORS;  Service: Urology;  Laterality: N/A;   ESOPHAGEAL DILATION Right 06/02/2022   Procedure: ESOPHAGEAL DILATION;  Surgeon: Harvel Quale, MD;  [facility];  Service: Gastroenterology;  Laterality: Right;   ESOPHAGOGASTRODUODENOSCOPY (EGD) WITH PROPOFOL N/A 06/02/2022   Procedure: ESOPHAGOGASTRODUODENOSCOPY (EGD) WITH PROPOFOL;  Surgeon: Harvel Quale, MD;  [facility];  Service: Gastroenterology;  Laterality: N/A;  TO BE DONE IN OR 2   FISTULOGRAM Right 10/20/2016   Procedure: FISTULOGRAM WITH POSSIBLE INTERVENTION;  Surgeon: [name], MD;  Location: [facility];  Service: Vascular;  Laterality: Right;   GIVENS CAPSULE STUDY N/A 06/11/2022   Procedure: GIVENS CAPSULE STUDY;  Surgeon: Clarene Essex, MD;  Location: [place];  Service: Gastroenterology;  Laterality: N/A;   HEMOSTASIS CLIP PLACEMENT  06/02/2022   Procedure: HEMOSTASIS CLIP PLACEMENT;  Surgeon:  Harvel Quale, MD;  [facility];  Service: Gastroenterology;;   HOT HEMOSTASIS  06/02/2022   Procedure: HOT HEMOSTASIS (ARGON PLASMA COAGULATION/BICAP);  Surgeon: Montez Morita, Quillian Quince, MD;  [facility];  Service: Gastroenterology;;   INSERTION OF DIALYSIS CATHETER N/A 11/26/2015   Procedure: INSERTION OF DIALYSIS CATHETER;  Surgeon: Angelia Mould, MD;  Location: [place];  Service: Vascular;  Laterality: N/A;   INTRAVASCULAR IMAGING/OCT N/A 03/08/2022   Procedure: INTRAVASCULAR IMAGING/OCT;  Surgeon: Sherren Mocha, MD;  Location: [place] CV LAB;  Service: Cardiovascular;  Laterality: N/A;   LAMINECTOMY  12/29/2011   LEFT HEART CATH AND CORONARY ANGIOGRAPHY N/A 03/06/2022   Procedure: LEFT HEART CATH AND CORONARY ANGIOGRAPHY;  Surgeon: Nelva Bush, MD;  Location: [place] CV LAB;  Service: Cardiovascular;  Laterality: N/A;   LUMBAR LAMINECTOMY/DECOMPRESSION MICRODISCECTOMY  12/29/2011   Procedure: LUMBAR LAMINECTOMY/DECOMPRESSION MICRODISCECTOMY;  Surgeon: Floyce Stakes, MD;  Location: [place] NEURO ORS;  Service: Neurosurgery;  Laterality: N/A;  Lumbar Two  through Lumbar Five Laminectomies/Cellsaver   PARTIAL HYSTERECTOMY     PERIPHERAL VASCULAR BALLOON ANGIOPLASTY  03/03/2020   Procedure: PERIPHERAL VASCULAR BALLOON ANGIOPLASTY;  Surgeon: Marty Heck, MD;  Location: [place] CV LAB;  Service: Cardiovascular;;  Rt arm fistula   PERIPHERAL VASCULAR BALLOON ANGIOPLASTY Right 04/15/2021   Procedure: PERIPHERAL VASCULAR BALLOON ANGIOPLASTY;  Surgeon: Angelia Mould, MD;  Location: [place] CV LAB;  Service: Cardiovascular;  Laterality: Right;  arm fistula   PERIPHERAL VASCULAR BALLOON ANGIOPLASTY  03/14/2022   Procedure: PERIPHERAL VASCULAR BALLOON ANGIOPLASTY;  Surgeon: Broadus John, MD;  Location: [place] CV LAB;  Service: Cardiovascular;;  right arm AVF   PERIPHERAL VASCULAR CATHETERIZATION N/A 11/22/2015   Procedure:  Fistulagram;  Surgeon: Angelia Mould, MD;  Location: [place] CV LAB;  Service: Cardiovascular;  Laterality: N/A;   PERIPHERAL VASCULAR INTERVENTION  08/10/2017   Procedure: PERIPHERAL VASCULAR INTERVENTION;  Surgeon: Elam Dutch, MD;  Location: [place] [facility];  Service: Cardiovascular;;   SHOULDER SURGERY Right    Rotator cuff    Current Outpatient Medications  Medication Sig Dispense Refill   acetaminophen (TYLENOL) 500 MG tablet Take 1,000 mg by mouth every 6 (six) hours as needed for moderate pain or headache.     albuterol (PROVENTIL) (2.5 MG/3ML) 0.083% nebulizer solution Take 3 mLs (2.5 mg total) by nebulization every 6 (six) hours as needed for wheezing or shortness of breath. 75 mL 12   amiodarone (PACERONE) 200 MG tablet Take 1 tablet by mouth twice a day for 1 month then reduce to 1 tablet daily 60 tablet 0   apixaban (ELIQUIS) 5 MG TABS tablet Take 1 tablet (5 mg total) by mouth 2 (two) times daily. 180 tablet 3   aspirin EC 81 MG tablet Take 1 tablet (81 mg total) by mouth daily. Swallow whole. 30 tablet 12   budesonide-formoterol (SYMBICORT) 160-4.5 MCG/ACT inhaler Inhale 2 puffs into the lungs 2 (two) times daily. 3 each 4   calcium acetate (PHOSLO) 667 MG capsule Take 667-1,334 mg by mouth See admin instructions. Take 1334 mg with meals 3 times daily and 667 mg with snacks     Carboxymethylcellul-Glycerin (LUBRICATING EYE DROPS OP) Place 1 drop into both eyes daily as needed (dry eyes).     cetirizine (ZYRTEC) 10 MG tablet Take 1 tablet (10 mg total) by mouth daily as needed for allergies. 90 tablet 1   Continuous Blood Gluc Receiver (FREESTYLE LIBRE 2 READER) DEVI USE TO TEST BLOOD SUGAR CONTINUOUSLY 1 each 2   Continuous Blood Gluc Sensor (FREESTYLE LIBRE 2 SENSOR) MISC Use multiple times daily to track glucose to prevent highs and lows as a complication of dialysis Dx:Z99.2, E11.59 2 each 12   diltiazem (CARDIZEM CD) 240 MG 24 hr capsule Take 1 capsule (240  mg total) by mouth daily. 90 capsule 3   Ferrous Fumarate (HEMOCYTE - 106 MG FE) 324 (106 Fe) MG TABS tablet Take 1 tablet (106 mg of iron total) by mouth daily. 30 tablet 11   gabapentin (NEURONTIN) 400 MG capsule TAKE 2 CAPSULES AFTER EACH DIALYSIS SESSION 30 capsule 1   glucose blood (ONETOUCH ULTRA) test strip Use to test blood sugar 3 times daily as directed. DX: E11.9 300 each 12   insulin aspart (NOVOLOG FLEXPEN) 100 UNIT/ML FlexPen Inject 1 Units into the skin 3 (three) times daily with meals. < 150: 0 units. 150-199:1 unit. 200-249: 2 units. 250-299: 3 units. 300-349: 4 units. Over 350: 5 units  15 mL 11   insulin degludec (TRESIBA FLEXTOUCH) 100 UNIT/ML FlexTouch Pen Inject 20 Units into the skin at bedtime. Via novo nordisk patient assistance program (Patient taking differently: Inject 30 Units into the skin at bedtime. Via novo nordisk patient assistance program)     lidocaine (LIDODERM) 5 % Place 1 patch onto the skin daily. Remove & Discard patch within 12 hours or as directed by MD 30 patch 0   lidocaine-prilocaine (EMLA) cream Apply 1 application  topically as needed (dialysis).     multivitamin (RENA-VIT) TABS tablet Take 1 tablet by mouth daily.     nitroGLYCERIN (NITROSTAT) 0.4 MG SL tablet Place 1 tablet (0.4 mg total) under the tongue every 5 (five) minutes as needed for chest pain. 25 tablet 11   ondansetron (ZOFRAN) 4 MG tablet TAKE 1 TABLET BY MOUTH EVERY 8 HOURS AS NEEDED FOR NAUSEA AND VOMITING 20 tablet 5   pantoprazole (PROTONIX) 40 MG tablet Take 1 tablet (40 mg total) by mouth 2 (two) times daily. 120 tablet 0   polyethylene glycol (MIRALAX / GLYCOLAX) 17 g packet Take 17 g by mouth daily as needed for moderate constipation or mild constipation. 14 each 0   rOPINIRole (REQUIP) 0.5 MG tablet Take 1 tablet (0.5 mg total) by mouth at bedtime. 30 tablet 0   Semaglutide,0.25 or 0.5MG/DOS, (OZEMPIC, 0.25 OR 0.5 MG/DOSE,) 2 MG/1.5ML SOPN Inject 0.5 mg into the skin every Friday.      torsemide (DEMADEX) 20 MG tablet Take 40-60 mg by mouth daily.     atenolol (TENORMIN) 100 MG tablet Take 0.5 tablets (50 mg total) by mouth at bedtime. 90 tablet 3   rosuvastatin (CRESTOR) 5 MG tablet Take 1 tablet (5 mg total) by mouth daily. For cholesterol (Patient not taking: Reported on 08/02/2022) 90 tablet 2   No current facility-administered medications for this encounter.    Allergies  Allergen Reactions   Azithromycin     Prolonged QT   Ace Inhibitors Cough   Clopidogrel Bisulfate Other (See Comments)    Sick, Headache, "Felt terrible"   Codeine Other (See Comments)    hallucinations   Lisinopril Cough   Penicillins Other (See Comments)    Unknown childhood reaction Did it involve swelling of the face/tongue/throat, SOB, or low BP? Unknown Did it involve sudden or severe rash/hives, skin peeling, or any reaction on the inside of your mouth or nose? Unknown Did you need to seek medical attention at a hospital or doctor's office? Unknown When did it last happen?      childhood allergy If all above answers are "NO", may proceed with cephalosporin use.      Social History   Socioeconomic History   Marital status: Widowed    Spouse name: Not on file   Number of children: 2   Years of education: GED   Highest education level: GED or equivalent  Occupational History   Occupation: Disabled  Tobacco Use   Smoking status: Former    Packs/day: 1.00    Years: 30.00    Total pack years: 30.00    Types: Cigarettes    Quit date: 12/04/2001    Years since quitting: 20.6    Passive exposure: Never   Smokeless tobacco: Never  Vaping Use   Vaping Use: Never used  Substance and Sexual Activity   Alcohol use: No    Alcohol/week: 6.0 standard drinks of alcohol    Types: 6 Cans of beer per week   Drug use: No  Sexual activity: Not Currently    Birth control/protection: Surgical  Other Topics Concern   Not on file  Social History Narrative   Lives in [place] with  mother   Daughters live nearby   Social Determinants of Health   Financial Resource Strain: Low Risk  (07/11/2022)   Overall Financial Resource Strain (CARDIA)    Difficulty of Paying Living Expenses: Not hard at all  Food Insecurity: No Food Insecurity (07/11/2022)   Hunger Vital Sign    Worried About Running Out of Food in the Last Year: Never true    Ran Out of Food in the Last Year: Never true  Transportation Needs: No Transportation Needs (07/11/2022)   PRAPARE - Hydrologist (Medical): No    Lack of Transportation (Non-Medical): No  Physical Activity: Inactive (12/16/2021)   Exercise Vital Sign    Days of Exercise per Week: 0 days    Minutes of Exercise per Session: 0 min  Stress: No Stress Concern Present (07/11/2022)   [place]    Feeling of Stress : Only a little  Social Connections: Moderately Isolated (07/11/2022)   Social Connection and Isolation Panel [NHANES]    Frequency of Communication with Friends and Family: More than three times a week    Frequency of Social Gatherings with Friends and Family: More than three times a week    Attends Religious Services: More than 4 times per year    Active Member of [employer] or Organizations: No    Attends Archivist Meetings: Never    Marital Status: Widowed  Intimate Partner Violence: Not At Risk (07/11/2022)   Humiliation, Afraid, Rape, and Kick questionnaire    Fear of Current or Ex-Partner: No    Emotionally Abused: No    Physically Abused: No    Sexually Abused: No    Family History  Problem Relation Age of Onset   Heart disease Other    Hypertension Father    Cancer Father    Hypertension Mother    Hypertension Daughter    Pancreatitis Brother    Hypertension Daughter    Diabetes Daughter     ROS- All systems are reviewed and negative except as per the HPI above  Physical Exam: Vitals:   08/02/22 1016  BP: (!)  140/52  Pulse: 97  Weight: 81 kg  Height: _0  (1.6 m)   Wt Readings from Last 3 Encounters:  08/02/22 81 kg  07/27/22 80.1 kg  07/12/22 81.2 kg    Labs: Lab Results  Component Value Date   NA 141 06/28/2022   K 5.4 (H) 06/28/2022   CL 98 06/28/2022   CO2 26 06/28/2022   GLUCOSE 148 (H) 06/28/2022   BUN 28 (H) 06/28/2022   CREATININE 6.19 (HH) 06/28/2022   CALCIUM 9.1 06/28/2022   PHOS 3.0 06/15/2022   MG 2.0 06/10/2022   Lab Results  Component Value Date   INR 1.9 (H) 03/26/2022   Lab Results  Component Value Date   CHOL 127 06/28/2022   HDL 42 06/28/2022   LDLCALC 66 06/28/2022   TRIG 100 06/28/2022     GEN- The patient is well appearing, alert and oriented x 3 today.   Head- normocephalic, atraumatic Eyes-  Sclera clear, conjunctiva pink Ears- hearing intact Oropharynx- clear Neck- supple, no JVP Lymph- no cervical lymphadenopathy Lungs- Clear to ausculation bilaterally, normal work of breathing Heart- Regular rate and rhythm, no murmurs, rubs  or gallops, PMI not laterally displaced GI- soft, NT, ND, + BS Extremities- no clubbing, cyanosis, or edema MS- no significant deformity or atrophy Skin- no rash or lesion Psych- euthymic mood, full affect Neuro- strength and sensation are intact  EKG-PR interval * ms QRS duration 90 ms QT/QTcB 364/462 ms P-R-T axes * 254 10 Atrial fibrillation Indeterminate axis Septal infarct , age undetermined Lateral infarct , age undetermined Prolonged QT Abnormal ECG When compared with ECG of 09-Jun-2022 18:31, No significant change since last tracing Confirmed by Gwyndolyn Kaufman [phone]) on 08/02/2022 3:19:23 PM Referred by: [name] Confirmed By: Gwyndolyn Kaufman    Assessment and Plan:  1. Afib Risk vrs benefit of drug discussed with pt She will start amiodarone 200 mg bid and decrease atenolol to 50 mg a day She was advised to take with food  I will bring back one week to get ekg on amio She is  aware of no benadryl, no hydroxyzine and no Seroquel Baseline TSH today, reviewed baseline cmet and cxr in epic. Had elevation of alk phos of 140 present on cmet form 7/26, not elevated one month earlier   Plan on cardioversion after one month loading   Butch Penny C. Jameon Deller, [facility] [address] [place], [place] [address] [phone]

## 2022-08-05 DIAGNOSIS — N250 Renal osteodystrophy: Secondary | ICD-10-CM | POA: Diagnosis not present

## 2022-08-05 DIAGNOSIS — N186 End stage renal disease: Secondary | ICD-10-CM | POA: Diagnosis not present

## 2022-08-05 DIAGNOSIS — D631 Anemia in chronic kidney disease: Secondary | ICD-10-CM | POA: Diagnosis not present

## 2022-08-05 DIAGNOSIS — N2581 Secondary hyperparathyroidism of renal origin: Secondary | ICD-10-CM | POA: Diagnosis not present

## 2022-08-05 DIAGNOSIS — D509 Iron deficiency anemia, unspecified: Secondary | ICD-10-CM | POA: Diagnosis not present

## 2022-08-05 DIAGNOSIS — E559 Vitamin D deficiency, unspecified: Secondary | ICD-10-CM | POA: Diagnosis not present

## 2022-08-05 DIAGNOSIS — Z992 Dependence on renal dialysis: Secondary | ICD-10-CM | POA: Diagnosis not present

## 2022-08-08 ENCOUNTER — Encounter: Payer: Self-pay | Admitting: *Deleted

## 2022-08-08 DIAGNOSIS — Z992 Dependence on renal dialysis: Secondary | ICD-10-CM | POA: Diagnosis not present

## 2022-08-08 DIAGNOSIS — N250 Renal osteodystrophy: Secondary | ICD-10-CM | POA: Diagnosis not present

## 2022-08-08 DIAGNOSIS — N186 End stage renal disease: Secondary | ICD-10-CM | POA: Diagnosis not present

## 2022-08-08 DIAGNOSIS — D509 Iron deficiency anemia, unspecified: Secondary | ICD-10-CM | POA: Diagnosis not present

## 2022-08-08 DIAGNOSIS — N2581 Secondary hyperparathyroidism of renal origin: Secondary | ICD-10-CM | POA: Diagnosis not present

## 2022-08-08 NOTE — Telephone Encounter (Signed)
ERROR

## 2022-08-08 NOTE — Telephone Encounter (Signed)
Left another message asking pt to call office and informing her I would mail letter to home address asking her to call to discuss.

## 2022-08-08 NOTE — Telephone Encounter (Signed)
After leaving message, I saw OV note I did not see prior to my message. This medication issue has been  resolved/completed, Amiodarone started by AFib clinic. Called back to apologize for mistake, pt answered, informed pt of my mishap.  We had a giggle as I told her to forget any previous messages about this. She appreciates my follow up though.

## 2022-08-09 ENCOUNTER — Ambulatory Visit (HOSPITAL_COMMUNITY)
Admission: RE | Admit: 2022-08-09 | Discharge: 2022-08-09 | Disposition: A | Discharge status: Home / Self Care | Payer: Medicare Other | Source: Ambulatory Visit | Attending: Nurse Practitioner | Admitting: Nurse Practitioner

## 2022-08-09 VITALS — HR 84

## 2022-08-09 DIAGNOSIS — I480 Paroxysmal atrial fibrillation: Secondary | ICD-10-CM | POA: Diagnosis not present

## 2022-08-09 NOTE — Progress Notes (Signed)
In for ekg with start of amiodarone 200 mg bid. EKg shows Vent. rate 84 BPM PR interval * ms QRS duration 90 ms QT/QTcB 418/493 ms P-R-T axes 232 -55 63 Atrial flutter with variable A-V block with premature ventricular or aberrantly conducted complexes Pulmonary disease pattern Left anterior fascicular block Septal infarct , age undetermined Abnormal ECG When compared with ECG of 02-Aug-2022 10:46, PREVIOUS ECG IS PRESENT  She states that she is having her shunt revised rt upper arm 9/12 and will need to stop anticoagulation for 2 days prior. I will see back in two weeks. She will continue amiodarone loading at 200 mg bid. After back on anticoagulation x 3 weeks I will schedule for cardioversion.

## 2022-08-10 DIAGNOSIS — D509 Iron deficiency anemia, unspecified: Secondary | ICD-10-CM | POA: Diagnosis not present

## 2022-08-10 DIAGNOSIS — N186 End stage renal disease: Secondary | ICD-10-CM | POA: Diagnosis not present

## 2022-08-10 DIAGNOSIS — N250 Renal osteodystrophy: Secondary | ICD-10-CM | POA: Diagnosis not present

## 2022-08-10 DIAGNOSIS — Z992 Dependence on renal dialysis: Secondary | ICD-10-CM | POA: Diagnosis not present

## 2022-08-10 DIAGNOSIS — N2581 Secondary hyperparathyroidism of renal origin: Secondary | ICD-10-CM | POA: Diagnosis not present

## 2022-08-11 ENCOUNTER — Encounter (HOSPITAL_COMMUNITY)
Admission: RE | Admit: 2022-08-11 | Discharge: 2022-08-11 | Disposition: A | Discharge status: Home / Self Care | Payer: Medicare Other | Source: Ambulatory Visit | Attending: Vascular Surgery | Admitting: Vascular Surgery

## 2022-08-12 DIAGNOSIS — D509 Iron deficiency anemia, unspecified: Secondary | ICD-10-CM | POA: Diagnosis not present

## 2022-08-12 DIAGNOSIS — N2581 Secondary hyperparathyroidism of renal origin: Secondary | ICD-10-CM | POA: Diagnosis not present

## 2022-08-12 DIAGNOSIS — N250 Renal osteodystrophy: Secondary | ICD-10-CM | POA: Diagnosis not present

## 2022-08-12 DIAGNOSIS — Z992 Dependence on renal dialysis: Secondary | ICD-10-CM | POA: Diagnosis not present

## 2022-08-12 DIAGNOSIS — N186 End stage renal disease: Secondary | ICD-10-CM | POA: Diagnosis not present

## 2022-08-14 ENCOUNTER — Ambulatory Visit (INDEPENDENT_AMBULATORY_CARE_PROVIDER_SITE_OTHER): Payer: Medicare Other | Admitting: Family Medicine

## 2022-08-14 ENCOUNTER — Encounter: Payer: Self-pay | Admitting: Family Medicine

## 2022-08-14 VITALS — BP 128/65 | HR 84 | Temp 98.4°F | Ht 63.0 in | Wt 178.0 lb

## 2022-08-14 DIAGNOSIS — I70219 Atherosclerosis of native arteries of extremities with intermittent claudication, unspecified extremity: Secondary | ICD-10-CM | POA: Diagnosis not present

## 2022-08-14 DIAGNOSIS — N2581 Secondary hyperparathyroidism of renal origin: Secondary | ICD-10-CM | POA: Diagnosis not present

## 2022-08-14 DIAGNOSIS — I8311 Varicose veins of right lower extremity with inflammation: Secondary | ICD-10-CM

## 2022-08-14 DIAGNOSIS — I8312 Varicose veins of left lower extremity with inflammation: Secondary | ICD-10-CM

## 2022-08-14 DIAGNOSIS — Z992 Dependence on renal dialysis: Secondary | ICD-10-CM | POA: Diagnosis not present

## 2022-08-14 DIAGNOSIS — N250 Renal osteodystrophy: Secondary | ICD-10-CM | POA: Diagnosis not present

## 2022-08-14 DIAGNOSIS — D509 Iron deficiency anemia, unspecified: Secondary | ICD-10-CM | POA: Diagnosis not present

## 2022-08-14 DIAGNOSIS — N186 End stage renal disease: Secondary | ICD-10-CM | POA: Diagnosis not present

## 2022-08-14 MED ORDER — DULOXETINE HCL 30 MG PO CPEP: 30.0000 mg | ORAL_CAPSULE | Freq: Every day | ORAL | 0 refills | Status: DC

## 2022-08-14 NOTE — Progress Notes (Signed)
Subjective:  Patient ID: [name], female    DOB: [birth year]  Age: 71 y.o. MRN: [id number]  CC: Leg Problems (Bilateral calf pain and "knots")   HPI [name] presents for knots in the calves - painful. Onset 1 month ago. Getting worse. Having new Anastamosis for dialysis placed tomorrow by Dr. Donnetta Hutching Had VEnous and arterial dopplers recently that were negative for clot, showed mild left arterial dx and mild right arterial dx.     08/14/2022    2:01 PM 07/11/2022    1:24 PM 06/28/2022   10:52 AM  Depression screen PHQ 2/9  Decreased Interest 0 0 0  Down, Depressed, Hopeless 0 0 0  PHQ - 2 Score 0 0 0  Altered sleeping 1  1  Tired, decreased energy 1  1  Change in appetite 0  1  Feeling bad or failure about yourself  0    Trouble concentrating 0    Moving slowly or fidgety/restless 0    Suicidal thoughts 0    PHQ-9 Score 2  3  Difficult doing work/chores Not difficult at all  Somewhat difficult    History [name] has a past medical history of Acute on chronic diastolic CHF (congestive heart failure) ([employer]) (03/05/2015), Anemia, Anxiety, Arthritis, CAD (coronary artery disease), Chronic kidney disease, Chronic neck pain, COPD (chronic obstructive pulmonary disease) ([place]), Degenerative joint disease, Diabetic neuropathy ([place]), Diverticulosis, Esophageal stricture, GERD (gastroesophageal reflux disease), Hyperlipidemia, Hypertension, IDDM (insulin dependent diabetes mellitus), Pericardial effusion (03/06/2015), Peripheral vascular disease ([place]), Stroke ([place]), and Vitamin D deficiency.   She has a past surgical history that includes Bladder surgery; Partial hysterectomy; Shoulder surgery (Right); Cervical spine surgery (2012); Abdominal hysterectomy; Laminectomy (12/29/2011); cardiac stents; Lumbar laminectomy/decompression microdiscectomy (12/29/2011); Back surgery; Cystoscopy with injection (N/A, 06/03/2013); Cardiac catheterization (2003, 2005, 2009); Cataract extraction w/PHACO (Right,  05/04/2014); Cataract extraction w/PHACO (Left, 07/20/2014); AV fistula placement (Right, 07/20/2015); Cardiac catheterization (N/A, 11/22/2015); Insertion of dialysis catheter (N/A, 11/26/2015); AV fistula placement (Right, 11/26/2015); Fistulogram (Right, 10/20/2016); A/V Fistulagram (N/A, 08/10/2017); PERIPHERAL VASCULAR INTERVENTION (08/10/2017); A/V Fistulagram (N/A, 03/03/2020); PERIPHERAL VASCULAR BALLOON ANGIOPLASTY (03/03/2020); PERIPHERAL VASCULAR BALLOON ANGIOPLASTY (Right, 04/15/2021); LEFT HEART CATH AND CORONARY ANGIOGRAPHY (N/A, 03/06/2022); CORONARY ATHERECTOMY (N/A, 03/08/2022); INTRAVASCULAR IMAGING/OCT (N/A, 03/08/2022); PERIPHERAL VASCULAR BALLOON ANGIOPLASTY (03/14/2022); Cardioversion (N/A, 04/24/2022); Esophagogastroduodenoscopy (egd) with propofol (N/A, 06/02/2022); Esophageal dilation (Right, 06/02/2022); Hemostasis clip placement (06/02/2022); Hot hemostasis (06/02/2022); and Givens capsule study (N/A, 06/11/2022).   Her family history includes Cancer in her father; Diabetes in her daughter; Heart disease in an other family member; Hypertension in her daughter, daughter, father, and mother; Pancreatitis in her brother.She reports that she quit smoking about 20 years ago. Her smoking use included cigarettes. She has a 30.00 pack-year smoking history. She has never been exposed to tobacco smoke. She has never used smokeless tobacco. She reports that she does not drink alcohol and does not use drugs.    ROS Review of Systems  Objective:  BP 128/65   Pulse 84   Temp 98.4 F (36.9 C)   Ht 5\' 3"  (1.6 m)   Wt 178 lb (80.7 kg)   SpO2 96%   BMI 31.53 kg/m   BP Readings from Last 3 Encounters:  08/14/22 128/65  08/02/22 (!) 140/52  07/27/22 139/60    Wt Readings from Last 3 Encounters:  08/14/22 178 lb (80.7 kg)  08/02/22 178 lb 9.6 oz (81 kg)  07/27/22 176 lb 8 oz (80.1 kg)     Physical Exam Constitutional:  General: She is not in acute distress.    Appearance: She is well-developed.   Cardiovascular:     Rate and Rhythm: Normal rate and regular rhythm.     Comments: Bilateral ropy varicosities. R>L Pulmonary:     Breath sounds: Normal breath sounds.  Musculoskeletal:        General: Normal range of motion.     Right lower leg: No edema.     Left lower leg: No edema.  Skin:    General: Skin is warm and dry.  Neurological:     Mental Status: She is alert and oriented to person, place, and time.       Assessment & Plan:   [name] was seen today for leg problems.  Diagnoses and all orders for this visit:  Varicose veins of both lower extremities with inflammation  Other orders -     DULoxetine (CYMBALTA) 30 MG capsule; Take 1 capsule (30 mg total) by mouth daily. With supper       I am having Tamara Render. Baker start on DULoxetine. I am also having her maintain her calcium acetate, acetaminophen, Carboxymethylcellul-Glycerin (LUBRICATING EYE DROPS OP), OneTouch Ultra, FreeStyle Libre 2 Sensor, Ozempic (0.25 or 0.5 MG/DOSE), nitroGLYCERIN, FreeStyle Libre 2 Reader, albuterol, rosuvastatin, polyethylene glycol, apixaban, budesonide-formoterol, Tresiba FlexTouch, multivitamin, diltiazem, NovoLOG FlexPen, pantoprazole, aspirin EC, rOPINIRole, cetirizine, Ferrous Fumarate, ondansetron, torsemide, gabapentin, lidocaine, amiodarone, atenolol, rOPINIRole, and Salonpas.  Allergies as of 08/14/2022       Reactions   Azithromycin    Prolonged QT   Ace Inhibitors Cough   Clopidogrel Bisulfate Other (See Comments)   Sick, Headache, "Felt terrible"   Codeine Other (See Comments)   hallucinations   Lisinopril Cough   Penicillins Other (See Comments)   Unknown childhood reaction Did it involve swelling of the face/tongue/throat, SOB, or low BP? Unknown Did it involve sudden or severe rash/hives, skin peeling, or any reaction on the inside of your mouth or nose? Unknown Did you need to seek medical attention at a hospital or doctor's office? Unknown When did it last  happen?      childhood allergy If all above answers are "NO", may proceed with cephalosporin use.        Medication List        Accurate as of August 14, 2022  2:43 PM. If you have any questions, ask your nurse or doctor.          acetaminophen 500 MG tablet Commonly known as: TYLENOL Take 1,000 mg by mouth every 6 (six) hours as needed for moderate pain or headache.   albuterol (2.5 MG/3ML) 0.083% nebulizer solution Commonly known as: PROVENTIL Take 3 mLs (2.5 mg total) by nebulization every 6 (six) hours as needed for wheezing or shortness of breath.   amiodarone 200 MG tablet Commonly known as: PACERONE Take 1 tablet by mouth twice a day for 1 month then reduce to 1 tablet daily   apixaban 5 MG Tabs tablet Commonly known as: ELIQUIS Take 1 tablet (5 mg total) by mouth 2 (two) times daily.   aspirin EC 81 MG tablet Take 1 tablet (81 mg total) by mouth daily. Swallow whole.   atenolol 100 MG tablet Commonly known as: TENORMIN Take 0.5 tablets (50 mg total) by mouth at bedtime.   budesonide-formoterol 160-4.5 MCG/ACT inhaler Commonly known as: SYMBICORT Inhale 2 puffs into the lungs 2 (two) times daily. What changed:  when to take this reasons to take this   calcium acetate 667 MG  capsule Commonly known as: PHOSLO Take 667-1,334 mg by mouth See admin instructions. Take 1334 mg with meals 3 times daily and 667 mg with snacks   cetirizine 10 MG tablet Commonly known as: ZYRTEC Take 1 tablet (10 mg total) by mouth daily as needed for allergies. What changed: when to take this   diltiazem 240 MG 24 hr capsule Commonly known as: CARDIZEM CD Take 1 capsule (240 mg total) by mouth daily.   DULoxetine 30 MG capsule Commonly known as: Cymbalta Take 1 capsule (30 mg total) by mouth daily. With supper Started by: [name], MD   Ferrous Fumarate 324 (106 Fe) MG Tabs tablet Commonly known as: HEMOCYTE - 106 mg FE Take 1 tablet (106 mg of iron total) by  mouth daily.   FreeStyle Libre 2 Reader Kerrin Mo USE TO TEST BLOOD SUGAR CONTINUOUSLY   FreeStyle Libre 2 Sensor Misc Use multiple times daily to track glucose to prevent highs and lows as a complication of dialysis Dx:Z99.2, E11.59   gabapentin 400 MG capsule Commonly known as: NEURONTIN TAKE 2 CAPSULES AFTER EACH DIALYSIS SESSION What changed: See the new instructions.   lidocaine 5 % Commonly known as: Lidoderm Place 1 patch onto the skin daily. Remove & Discard patch within 12 hours or as directed by MD   LUBRICATING EYE DROPS OP Place 1 drop into both eyes daily as needed (dry eyes).   multivitamin Tabs tablet Take 1 tablet by mouth daily.   nitroGLYCERIN 0.4 MG SL tablet Commonly known as: NITROSTAT Place 1 tablet (0.4 mg total) under the tongue every 5 (five) minutes as needed for chest pain.   NovoLOG FlexPen 100 UNIT/ML FlexPen Generic drug: insulin aspart Inject 1 Units into the skin 3 (three) times daily with meals. < 150: 0 units. 150-199:1 unit. 200-249: 2 units. 250-299: 3 units. 300-349: 4 units. Over 350: 5 units   ondansetron 4 MG tablet Commonly known as: ZOFRAN TAKE 1 TABLET BY MOUTH EVERY 8 HOURS AS NEEDED FOR NAUSEA AND VOMITING   OneTouch Ultra test strip Generic drug: glucose blood Use to test blood sugar 3 times daily as directed. DX: E11.9   Ozempic (0.25 or 0.5 MG/DOSE) 2 MG/1.5ML Sopn Generic drug: Semaglutide(0.25 or 0.5MG /DOS) Inject 0.5 mg into the skin every Friday.   pantoprazole 40 MG tablet Commonly known as: PROTONIX Take 1 tablet (40 mg total) by mouth 2 (two) times daily.   polyethylene glycol 17 g packet Commonly known as: MIRALAX / GLYCOLAX Take 17 g by mouth daily as needed for moderate constipation or mild constipation.   rOPINIRole 1 MG tablet Commonly known as: REQUIP Take 1 mg by mouth at bedtime.   rOPINIRole 0.5 MG tablet Commonly known as: REQUIP Take 1 tablet (0.5 mg total) by mouth at bedtime.   rosuvastatin 5 MG  tablet Commonly known as: Crestor Take 1 tablet (5 mg total) by mouth daily. For cholesterol   Salonpas 3.12-09-08 % Ptch Generic drug: Camphor-Menthol-Methyl Sal Place 1 patch onto the skin daily as needed (pain).   torsemide 20 MG tablet Commonly known as: DEMADEX Take 40 mg by mouth daily.   Tyler Aas FlexTouch 100 UNIT/ML FlexTouch Pen Generic drug: insulin degludec Inject 20 Units into the skin at bedtime. Via novo nordisk patient assistance program What changed: how much to take       Since seeing Dr. Donnetta Hutching tomorrow, ask him to evaluate varicosities.   Follow-up: Return in about 6 weeks (around 09/25/2022) for diabetes.  [name], M.D.

## 2022-08-15 ENCOUNTER — Ambulatory Visit (HOSPITAL_COMMUNITY): Payer: Medicare Other | Admitting: Anesthesiology

## 2022-08-15 ENCOUNTER — Ambulatory Visit (HOSPITAL_BASED_OUTPATIENT_CLINIC_OR_DEPARTMENT_OTHER): Payer: Medicare Other | Admitting: Anesthesiology

## 2022-08-15 ENCOUNTER — Ambulatory Visit (HOSPITAL_COMMUNITY): Payer: Medicare Other

## 2022-08-15 ENCOUNTER — Encounter (HOSPITAL_COMMUNITY)
Admission: RE | Disposition: A | Discharge status: Home / Self Care | Payer: Self-pay | Source: Home / Self Care | Attending: Vascular Surgery

## 2022-08-15 ENCOUNTER — Other Ambulatory Visit: Payer: Self-pay

## 2022-08-15 ENCOUNTER — Ambulatory Visit (HOSPITAL_COMMUNITY)
Admission: RE | Admit: 2022-08-15 | Discharge: 2022-08-15 | Disposition: A | Discharge status: Home / Self Care | Payer: Medicare Other | Attending: Vascular Surgery | Admitting: Vascular Surgery

## 2022-08-15 DIAGNOSIS — I509 Heart failure, unspecified: Secondary | ICD-10-CM | POA: Insufficient documentation

## 2022-08-15 DIAGNOSIS — D649 Anemia, unspecified: Secondary | ICD-10-CM | POA: Insufficient documentation

## 2022-08-15 DIAGNOSIS — K219 Gastro-esophageal reflux disease without esophagitis: Secondary | ICD-10-CM | POA: Diagnosis not present

## 2022-08-15 DIAGNOSIS — N186 End stage renal disease: Secondary | ICD-10-CM | POA: Insufficient documentation

## 2022-08-15 DIAGNOSIS — I25119 Atherosclerotic heart disease of native coronary artery with unspecified angina pectoris: Secondary | ICD-10-CM

## 2022-08-15 DIAGNOSIS — E1122 Type 2 diabetes mellitus with diabetic chronic kidney disease: Secondary | ICD-10-CM | POA: Diagnosis not present

## 2022-08-15 DIAGNOSIS — I4891 Unspecified atrial fibrillation: Secondary | ICD-10-CM | POA: Insufficient documentation

## 2022-08-15 DIAGNOSIS — I132 Hypertensive heart and chronic kidney disease with heart failure and with stage 5 chronic kidney disease, or end stage renal disease: Secondary | ICD-10-CM | POA: Diagnosis not present

## 2022-08-15 DIAGNOSIS — Z7984 Long term (current) use of oral hypoglycemic drugs: Secondary | ICD-10-CM | POA: Insufficient documentation

## 2022-08-15 DIAGNOSIS — F17210 Nicotine dependence, cigarettes, uncomplicated: Secondary | ICD-10-CM | POA: Insufficient documentation

## 2022-08-15 DIAGNOSIS — D759 Disease of blood and blood-forming organs, unspecified: Secondary | ICD-10-CM | POA: Diagnosis not present

## 2022-08-15 DIAGNOSIS — Z992 Dependence on renal dialysis: Secondary | ICD-10-CM | POA: Insufficient documentation

## 2022-08-15 DIAGNOSIS — Z87891 Personal history of nicotine dependence: Secondary | ICD-10-CM

## 2022-08-15 DIAGNOSIS — Y832 Surgical operation with anastomosis, bypass or graft as the cause of abnormal reaction of the patient, or of later complication, without mention of misadventure at the time of the procedure: Secondary | ICD-10-CM | POA: Diagnosis not present

## 2022-08-15 DIAGNOSIS — T82590A Other mechanical complication of surgically created arteriovenous fistula, initial encounter: Secondary | ICD-10-CM

## 2022-08-15 DIAGNOSIS — E1151 Type 2 diabetes mellitus with diabetic peripheral angiopathy without gangrene: Secondary | ICD-10-CM | POA: Diagnosis not present

## 2022-08-15 DIAGNOSIS — I5032 Chronic diastolic (congestive) heart failure: Secondary | ICD-10-CM | POA: Diagnosis not present

## 2022-08-15 DIAGNOSIS — M9080 Osteopathy in diseases classified elsewhere, unspecified site: Secondary | ICD-10-CM

## 2022-08-15 DIAGNOSIS — Z955 Presence of coronary angioplasty implant and graft: Secondary | ICD-10-CM | POA: Insufficient documentation

## 2022-08-15 DIAGNOSIS — Z7901 Long term (current) use of anticoagulants: Secondary | ICD-10-CM | POA: Insufficient documentation

## 2022-08-15 DIAGNOSIS — T82898A Other specified complication of vascular prosthetic devices, implants and grafts, initial encounter: Secondary | ICD-10-CM

## 2022-08-15 DIAGNOSIS — N185 Chronic kidney disease, stage 5: Secondary | ICD-10-CM

## 2022-08-15 DIAGNOSIS — Z452 Encounter for adjustment and management of vascular access device: Secondary | ICD-10-CM | POA: Diagnosis not present

## 2022-08-15 DIAGNOSIS — I700 Atherosclerosis of aorta: Secondary | ICD-10-CM | POA: Diagnosis not present

## 2022-08-15 HISTORY — PX: INSERTION OF DIALYSIS CATHETER: SHX1324

## 2022-08-15 HISTORY — PX: AV FISTULA PLACEMENT: SHX1204

## 2022-08-15 LAB — BASIC METABOLIC PANEL
Anion gap: 13 (ref 5–15)
BUN: 29 mg/dL — ABNORMAL HIGH (ref 8–23)
CO2: 31 mmol/L (ref 22–32)
Calcium: 9.2 mg/dL (ref 8.9–10.3)
Chloride: 93 mmol/L — ABNORMAL LOW (ref 98–111)
Creatinine, Ser: 7.04 mg/dL — ABNORMAL HIGH (ref 0.44–1.00)
GFR, Estimated: 6 mL/min — ABNORMAL LOW (ref >60)
Glucose, Bld: 171 mg/dL — ABNORMAL HIGH (ref 70–99)
Potassium: 3.6 mmol/L (ref 3.5–5.1)
Sodium: 137 mmol/L (ref 135–145)

## 2022-08-15 SURGERY — INSERTION OF ARTERIOVENOUS (AV) GORE-TEX GRAFT ARM
Anesthesia: General | Site: Chest | Laterality: Right

## 2022-08-15 MED ORDER — FENTANYL CITRATE (PF) 100 MCG/2ML IJ SOLN
INTRAMUSCULAR | Status: DC | PRN
  Administered 2022-08-15 (×4): 25 ug via INTRAVENOUS

## 2022-08-15 MED ORDER — PHENYLEPHRINE HCL-NACL 20-0.9 MG/250ML-% IV SOLN
INTRAVENOUS | Status: DC | PRN
  Administered 2022-08-15: 30 ug/min via INTRAVENOUS

## 2022-08-15 MED ORDER — HEPARIN SODIUM (PORCINE) 1000 UNIT/ML IJ SOLN
INTRAMUSCULAR | Status: AC
  Filled 2022-08-15: qty 10

## 2022-08-15 MED ORDER — HEPARIN 6000 UNIT IRRIGATION SOLUTION
Status: DC | PRN
  Administered 2022-08-15: 1

## 2022-08-15 MED ORDER — PHENYLEPHRINE 80 MCG/ML (10ML) SYRINGE FOR IV PUSH (FOR BLOOD PRESSURE SUPPORT)
PREFILLED_SYRINGE | INTRAVENOUS | Status: DC | PRN
  Administered 2022-08-15: 160 ug via INTRAVENOUS

## 2022-08-15 MED ORDER — VASOPRESSIN 20 UNIT/ML IV SOLN
INTRAVENOUS | Status: DC | PRN
  Administered 2022-08-15 (×2): 1 [IU] via INTRAVENOUS

## 2022-08-15 MED ORDER — IPRATROPIUM-ALBUTEROL 0.5-2.5 (3) MG/3ML IN SOLN
3.0000 mL | Freq: Once | RESPIRATORY_TRACT | Status: AC
  Administered 2022-08-15: 3 mL via RESPIRATORY_TRACT

## 2022-08-15 MED ORDER — PROPOFOL 500 MG/50ML IV EMUL
INTRAVENOUS | Status: AC
  Filled 2022-08-15: qty 50

## 2022-08-15 MED ORDER — ALBUTEROL SULFATE (2.5 MG/3ML) 0.083% IN NEBU
INHALATION_SOLUTION | RESPIRATORY_TRACT | Status: AC
  Filled 2022-08-15: qty 3

## 2022-08-15 MED ORDER — VANCOMYCIN HCL IN DEXTROSE 1-5 GM/200ML-% IV SOLN
1000.0000 mg | INTRAVENOUS | Status: AC
  Administered 2022-08-15: 1000 mg via INTRAVENOUS
  Filled 2022-08-15: qty 200

## 2022-08-15 MED ORDER — HYDROMORPHONE HCL 1 MG/ML IJ SOLN: 0.2500 mg | INTRAMUSCULAR | Status: DC | PRN

## 2022-08-15 MED ORDER — ORAL CARE MOUTH RINSE: 15.0000 mL | Freq: Once | OROMUCOSAL | Status: AC

## 2022-08-15 MED ORDER — LIDOCAINE 2% (20 MG/ML) 5 ML SYRINGE
INTRAMUSCULAR | Status: DC | PRN
  Administered 2022-08-15: 50 mg via INTRAVENOUS

## 2022-08-15 MED ORDER — LIDOCAINE-EPINEPHRINE 0.5 %-1:200000 IJ SOLN
INTRAMUSCULAR | Status: DC | PRN
  Administered 2022-08-15: 26 mL
  Administered 2022-08-15: 9 mL

## 2022-08-15 MED ORDER — PROPOFOL 500 MG/50ML IV EMUL
INTRAVENOUS | Status: DC | PRN
  Administered 2022-08-15: 50 ug/kg/min via INTRAVENOUS

## 2022-08-15 MED ORDER — SODIUM CHLORIDE 0.9 % IV SOLN: INTRAVENOUS | Status: DC

## 2022-08-15 MED ORDER — STERILE WATER FOR IRRIGATION IR SOLN
Status: DC | PRN
  Administered 2022-08-15: 1000 mL

## 2022-08-15 MED ORDER — METOCLOPRAMIDE HCL 5 MG/ML IJ SOLN
INTRAMUSCULAR | Status: DC | PRN
  Administered 2022-08-15: 5 mg via INTRAVENOUS

## 2022-08-15 MED ORDER — CHLORHEXIDINE GLUCONATE 0.12 % MT SOLN
15.0000 mL | Freq: Once | OROMUCOSAL | Status: AC
  Administered 2022-08-15: 15 mL via OROMUCOSAL

## 2022-08-15 MED ORDER — 0.9 % SODIUM CHLORIDE (POUR BTL) OPTIME
TOPICAL | Status: DC | PRN
  Administered 2022-08-15: 1000 mL

## 2022-08-15 MED ORDER — MIDAZOLAM HCL 2 MG/2ML IJ SOLN
INTRAMUSCULAR | Status: AC
  Filled 2022-08-15: qty 2

## 2022-08-15 MED ORDER — FENTANYL CITRATE (PF) 100 MCG/2ML IJ SOLN
INTRAMUSCULAR | Status: AC
  Filled 2022-08-15: qty 2

## 2022-08-15 MED ORDER — METOCLOPRAMIDE HCL 5 MG/ML IJ SOLN
INTRAMUSCULAR | Status: AC
  Filled 2022-08-15: qty 2

## 2022-08-15 MED ORDER — CHLORHEXIDINE GLUCONATE 4 % EX LIQD: 60.0000 mL | Freq: Once | CUTANEOUS | Status: DC

## 2022-08-15 MED ORDER — SODIUM CHLORIDE (PF) 0.9 % IJ SOLN
INTRAMUSCULAR | Status: AC
  Filled 2022-08-15: qty 20

## 2022-08-15 MED ORDER — LIDOCAINE-EPINEPHRINE 0.5 %-1:200000 IJ SOLN
INTRAMUSCULAR | Status: AC
  Filled 2022-08-15: qty 1

## 2022-08-15 MED ORDER — OXYCODONE-ACETAMINOPHEN 5-325 MG PO TABS: 1.0000 | ORAL_TABLET | Freq: Four times a day (QID) | ORAL | 0 refills | Status: DC | PRN

## 2022-08-15 MED ORDER — LIDOCAINE HCL (PF) 2 % IJ SOLN
INTRAMUSCULAR | Status: AC
  Filled 2022-08-15: qty 5

## 2022-08-15 MED ORDER — IPRATROPIUM-ALBUTEROL 0.5-2.5 (3) MG/3ML IN SOLN
RESPIRATORY_TRACT | Status: AC
  Filled 2022-08-15: qty 3

## 2022-08-15 MED ORDER — MIDAZOLAM HCL 5 MG/5ML IJ SOLN
INTRAMUSCULAR | Status: DC | PRN
  Administered 2022-08-15: 1 mg via INTRAVENOUS

## 2022-08-15 MED ORDER — HEPARIN SODIUM (PORCINE) 1000 UNIT/ML IJ SOLN
INTRAMUSCULAR | Status: DC | PRN
  Administered 2022-08-15: 4000 [IU] via INTRAVENOUS

## 2022-08-15 MED ORDER — PHENYLEPHRINE 80 MCG/ML (10ML) SYRINGE FOR IV PUSH (FOR BLOOD PRESSURE SUPPORT)
PREFILLED_SYRINGE | INTRAVENOUS | Status: AC
  Filled 2022-08-15: qty 20

## 2022-08-15 MED ORDER — PHENYLEPHRINE HCL (PRESSORS) 10 MG/ML IV SOLN
INTRAVENOUS | Status: AC
  Filled 2022-08-15: qty 1

## 2022-08-15 SURGICAL SUPPLY — 64 items
ADH SKN CLS APL DERMABOND .7 (GAUZE/BANDAGES/DRESSINGS) ×6
ARMBAND PINK RESTRICT EXTREMIT (MISCELLANEOUS) ×2 IMPLANT
BAG DECANTER FOR FLEXI CONT (MISCELLANEOUS) ×2 IMPLANT
BAG HAMPER (MISCELLANEOUS) ×2 IMPLANT
BIOPATCH BLUE 3/4IN DISK W/1.5 (GAUZE/BANDAGES/DRESSINGS) IMPLANT
BIOPATCH RED 1 DISK 7.0 (GAUZE/BANDAGES/DRESSINGS) ×2 IMPLANT
CANNULA VESSEL 3MM 2 BLNT TIP (CANNULA) ×2 IMPLANT
CATH PALINDROME-P 23CM W/VT (CATHETERS) IMPLANT
CLIP LIGATING EXTRA MED SLVR (CLIP) ×2 IMPLANT
CLIP LIGATING EXTRA SM BLUE (MISCELLANEOUS) ×2 IMPLANT
COVER LIGHT HANDLE STERIS (MISCELLANEOUS) ×4 IMPLANT
COVER MAYO STAND XLG (MISCELLANEOUS) ×2 IMPLANT
COVER PROBE U/S 5X48 (MISCELLANEOUS) ×2 IMPLANT
COVER SURGICAL LIGHT HANDLE (MISCELLANEOUS) ×2 IMPLANT
DECANTER SPIKE VIAL GLASS SM (MISCELLANEOUS) ×2 IMPLANT
DERMABOND ADVANCED .7 DNX12 (GAUZE/BANDAGES/DRESSINGS) ×2 IMPLANT
DRAPE C-ARM FOLDED MOBILE STRL (DRAPES) ×2 IMPLANT
DRAPE CHEST BREAST 15X10 FENES (DRAPES) ×2 IMPLANT
DRSG COVADERM 4X6 (GAUZE/BANDAGES/DRESSINGS) IMPLANT
ELECT REM PT RETURN 9FT ADLT (ELECTROSURGICAL) ×2
ELECTRODE REM PT RTRN 9FT ADLT (ELECTROSURGICAL) ×2 IMPLANT
GAUZE SPONGE 4X4 12PLY STRL (GAUZE/BANDAGES/DRESSINGS) ×4 IMPLANT
GAUZE SPONGE 4X4 12PLY STRL LF (GAUZE/BANDAGES/DRESSINGS) IMPLANT
GEL ULTRASOUND 20GR AQUASONIC (MISCELLANEOUS) ×2 IMPLANT
GLOVE BIO SURGEON STRL SZ7 (GLOVE) IMPLANT
GLOVE BIOGEL PI IND STRL 7.0 (GLOVE) ×4 IMPLANT
GLOVE SS BIOGEL STRL SZ 6.5 (GLOVE) IMPLANT
GLOVE SURG MICRO LTX SZ7.5 (GLOVE) ×2 IMPLANT
GOWN STRL REUS W/TWL LRG LVL3 (GOWN DISPOSABLE) ×8 IMPLANT
GRAFT GORETEX STRT 4-7X45 (Vascular Products) IMPLANT
IV NS 500ML (IV SOLUTION) ×4
IV NS 500ML BAXH (IV SOLUTION) ×4 IMPLANT
KIT BLADEGUARD II DBL (SET/KITS/TRAYS/PACK) ×2 IMPLANT
KIT TURNOVER KIT A (KITS) ×2 IMPLANT
MANIFOLD NEPTUNE II (INSTRUMENTS) ×2 IMPLANT
MARKER SKIN DUAL TIP RULER LAB (MISCELLANEOUS) ×4 IMPLANT
NDL 18GX1X1/2 (RX/OR ONLY) (NEEDLE) ×2 IMPLANT
NDL HYPO 18GX1.5 BLUNT FILL (NEEDLE) ×2 IMPLANT
NDL HYPO 25GX1X1/2 BEV (NEEDLE) ×2 IMPLANT
NEEDLE 18GX1X1/2 (RX/OR ONLY) (NEEDLE) ×2 IMPLANT
NEEDLE HYPO 18GX1.5 BLUNT FILL (NEEDLE) ×2 IMPLANT
NEEDLE HYPO 25GX1X1/2 BEV (NEEDLE) ×2 IMPLANT
NS IRRIG 1000ML POUR BTL (IV SOLUTION) ×2 IMPLANT
PACK CV ACCESS (CUSTOM PROCEDURE TRAY) ×2 IMPLANT
PACK SURGICAL SETUP 50X90 (CUSTOM PROCEDURE TRAY) ×2 IMPLANT
PAD ARMBOARD 7.5X6 YLW CONV (MISCELLANEOUS) ×4 IMPLANT
SET BASIN LINEN APH (SET/KITS/TRAYS/PACK) ×2 IMPLANT
SOAP 2 % CHG 4 OZ (WOUND CARE) ×2 IMPLANT
SOL PREP POV-IOD 4OZ 10% (MISCELLANEOUS) ×2 IMPLANT
SOL PREP PROV IODINE SCRUB 4OZ (MISCELLANEOUS) ×2 IMPLANT
SPONGE T-LAP 18X18 ~~LOC~~+RFID (SPONGE) ×2 IMPLANT
SUT ETHILON 3 0 PS 1 (SUTURE) ×2 IMPLANT
SUT PROLENE 6 0 CC (SUTURE) ×2 IMPLANT
SUT VIC AB 3-0 SH 27 (SUTURE) ×4
SUT VIC AB 3-0 SH 27X BRD (SUTURE) ×2 IMPLANT
SUT VICRYL 4-0 PS2 18IN ABS (SUTURE) ×2 IMPLANT
SYR 10ML LL (SYRINGE) ×2 IMPLANT
SYR 50ML LL SCALE MARK (SYRINGE) ×2 IMPLANT
SYR 5ML LL (SYRINGE) ×4 IMPLANT
SYR CONTROL 10ML LL (SYRINGE) ×2 IMPLANT
SYR TOOMEY 50ML (SYRINGE) IMPLANT
TOWEL GREEN STERILE (TOWEL DISPOSABLE) ×2 IMPLANT
UNDERPAD 30X36 HEAVY ABSORB (UNDERPADS AND DIAPERS) ×2 IMPLANT
WATER STERILE IRR 1000ML POUR (IV SOLUTION) IMPLANT

## 2022-08-15 NOTE — H&P (Signed)
Office Visit  07/12/2022 [facility]  [name], Arvilla Meres, MD Vascular Surgery ESRD on dialysis [facility]) Dx Follow-up ; Referred by Barbette Merino, PA-C Reason for Visit   Additional Documentation  Vitals:  BP 146/76 Important   (BP Location: Left Arm, Patient Position: Sitting, Cuff Size: Normal) Pulse 93 Temp 98.1 F (36.7 C) (Temporal) Resp 14 Ht 5\' 3"  (1.6 m) Wt 80.9 kg SpO2 93% BMI 31.60 kg/m BSA 1.9 m Pain Chamita 10-Worst pain ever (Loc: Arm)   More Vitals  Flowsheets:  Clinical Intake, Vital Signs, NEWS, MEWS Score, Anthropometrics, Method of Visit   Encounter Info:  Billing Info, History, Allergies, Detailed Report    All Notes   Progress Notes by Rosetta Posner, MD at 07/12/2022 10:30 AM  Author: Rosetta Posner, MD Author Type: Physician Filed: 07/12/2022  9:10 AM  Note Status: Signed Cosign: Cosign Not Required Encounter Date: 07/12/2022  Editor: Rosetta Posner, MD (Physician)                                                    [facility] [place]   Patient name: [name]          MRN: [id number]        DOB: February 03, 1951          Sex: female   REASON FOR VISIT: Evaluate right upper arm brachiocephalic AV fistula   HPI: [name] is a 71 y.o. female here today for evaluation of her right upper arm brachiocephalic fistula.  She reports that this was created approximately 7 years ago.  We have seen her regarding this in our office on several occasions.  She most recently underwent balloon angioplasty with Dr. Unk Lightning of the cephalic subclavian junction on 03/14/2022.  She had cardiac events requiring stenting and I saw her as an inpatient at [facility] on 03/28/2022.  At that time she was having prolonged bleeding from her puncture sites.  He did not feel that there is any issue with the longstanding fistula.  She did have some aneurysmal degeneration but no evidence of skin breakdown.  She was on Brilinta  and Eliquis and has had less bleeding recently.  She now reports that she is having pain with cannulation site and also reports that she is having long sessions required.  She does not have any new recent events bleeding       Past Medical History:  Diagnosis Date   Acute on chronic diastolic CHF (congestive heart failure) ([place]) 03/05/2015    Grade 2. EF 60-65%   Anemia      hx of   Anxiety     Arthritis     CAD (coronary artery disease)      s/p Stenting in 2005;  [place] 7/09: Vigorous LV function, 2-3+ MR, RI 40%, RI stent patent, proximal-mid RCA 40-50%;  Echo 7/09:  EF 55-65%, trivial MR;  [facility] 11/18/12: EF 66%, normal LV wall motion, mild to moderate anterior ischemia - reviewed by [facility] and felt to rep breast atten (low risk);  Echo 6/14: mild LVH, EF 55-65%, Gr 2 DD, PASP 44, trivial eff    Chronic kidney disease      CREATININE IS UP--GOING TO KIDNEY MD    Chronic neck pain     COPD (  chronic obstructive pulmonary disease) ([facility])     Degenerative joint disease      left shoulder   Diabetic neuropathy ([facility])     Diverticulosis     Esophageal stricture     GERD (gastroesophageal reflux disease)     Hyperlipidemia     Hypertension     IDDM (insulin dependent diabetes mellitus)     Pericardial effusion 03/06/2015    Very small per echo ; pleural nodules seen on CT chest.   Peripheral vascular disease ([place])     Stroke ([facility])      "mini- years ago"   Vitamin D deficiency             Family History  Problem Relation Age of Onset   Heart disease Other     Hypertension Father     Cancer Father     Hypertension Mother     Hypertension Daughter     Pancreatitis Brother     Hypertension Daughter     Diabetes Daughter        SOCIAL HISTORY: Social History         Tobacco Use   Smoking status: Former      Packs/day: 1.00      Years: 30.00      Total pack years: 30.00      Types: Cigarettes      Quit date: 12/04/2001      Years since quitting: 20.6      Passive exposure: Never    Smokeless tobacco: Never  Substance Use Topics   Alcohol use: No      Alcohol/week: 6.0 standard drinks of alcohol      Types: 6 Cans of beer per week           Allergies  Allergen Reactions   Azithromycin        Prolonged QT   Ace Inhibitors Cough   Clopidogrel Bisulfate Other (See Comments)      Sick, Headache, "Felt terrible"   Codeine Other (See Comments)      hallucinations   Lisinopril Cough   Penicillins Other (See Comments)      Unknown childhood reaction Did it involve swelling of the face/tongue/throat, SOB, or low BP? Unknown Did it involve sudden or severe rash/hives, skin peeling, or any reaction on the inside of your mouth or nose? Unknown Did you need to seek medical attention at a hospital or doctor's office? Unknown When did it last happen?      childhood allergy If all above answers are "NO", may proceed with cephalosporin use.                Current Outpatient Medications  Medication Sig Dispense Refill   acetaminophen (TYLENOL) 500 MG tablet Take 1,000 mg by mouth every 6 (six) hours as needed for moderate pain or headache.       albuterol (PROVENTIL) (2.5 MG/3ML) 0.083% nebulizer solution Take 3 mLs (2.5 mg total) by nebulization every 6 (six) hours as needed for wheezing or shortness of breath. 75 mL 12   apixaban (ELIQUIS) 5 MG TABS tablet Take 1 tablet (5 mg total) by mouth 2 (two) times daily. 180 tablet 3   aspirin EC 81 MG tablet Take 1 tablet (81 mg total) by mouth daily. Swallow whole. 30 tablet 12   atenolol (TENORMIN) 100 MG tablet Take 1 tablet (100 mg total) by mouth at bedtime. 90 tablet 3   budesonide-formoterol (SYMBICORT) 160-4.5 MCG/ACT inhaler Inhale 2 puffs into the lungs  2 (two) times daily. 3 each 4   calcium acetate (PHOSLO) 667 MG capsule Take 667-1,334 mg by mouth See admin instructions. Take 1334 mg with meals 3 times daily and 667 mg with snacks       Carboxymethylcellul-Glycerin (LUBRICATING EYE DROPS OP) Place 1 drop into both  eyes daily as needed (dry eyes).       cetirizine (ZYRTEC) 10 MG tablet Take 1 tablet (10 mg total) by mouth daily as needed for allergies. 90 tablet 1   Continuous Blood Gluc Receiver (FREESTYLE LIBRE 2 READER) DEVI USE TO TEST BLOOD SUGAR CONTINUOUSLY 1 each 2   Continuous Blood Gluc Sensor (FREESTYLE LIBRE 2 SENSOR) MISC Use multiple times daily to track glucose to prevent highs and lows as a complication of dialysis Dx:Z99.2, E11.59 2 each 12   diltiazem (CARDIZEM CD) 240 MG 24 hr capsule Take 1 capsule (240 mg total) by mouth daily. 90 capsule 3   Ferrous Fumarate (HEMOCYTE - 106 MG FE) 324 (106 Fe) MG TABS tablet Take 1 tablet (106 mg of iron total) by mouth daily. 30 tablet 11   gabapentin (NEURONTIN) 400 MG capsule Take 2 capsules (800 mg total) by mouth See admin instructions. Take it after HD (Patient taking differently: Take 800 mg by mouth See admin instructions. Take after dialysis) 30 capsule 1   glucose blood (ONETOUCH ULTRA) test strip Use to test blood sugar 3 times daily as directed. DX: E11.9 300 each 12   insulin aspart (NOVOLOG FLEXPEN) 100 UNIT/ML FlexPen Inject 1 Units into the skin 3 (three) times daily with meals. < 150: 0 units. 150-199:1 unit. 200-249: 2 units. 250-299: 3 units. 300-349: 4 units. Over 350: 5 units 15 mL 11   insulin degludec (TRESIBA FLEXTOUCH) 100 UNIT/ML FlexTouch Pen Inject 20 Units into the skin at bedtime. Via novo nordisk patient assistance program (Patient taking differently: Inject 30 Units into the skin at bedtime. Via novo nordisk patient assistance program)       multivitamin (RENA-VIT) TABS tablet Take 1 tablet by mouth daily.       nitroGLYCERIN (NITROSTAT) 0.4 MG SL tablet Place 1 tablet (0.4 mg total) under the tongue every 5 (five) minutes as needed for chest pain. 25 tablet 11   ondansetron (ZOFRAN) 4 MG tablet TAKE 1 TABLET BY MOUTH EVERY 8 HOURS AS NEEDED FOR NAUSEA AND VOMITING 20 tablet 5   pantoprazole (PROTONIX) 40 MG tablet Take 1  tablet (40 mg total) by mouth 2 (two) times daily. 120 tablet 0   polyethylene glycol (MIRALAX / GLYCOLAX) 17 g packet Take 17 g by mouth daily as needed for moderate constipation or mild constipation. 14 each 0   QUEtiapine (SEROQUEL) 25 MG tablet Take 3 at bedtime for three days, then two at bedtime for 3 days. Then one a day at bedtime, then DC 18 tablet 0   rOPINIRole (REQUIP) 0.5 MG tablet Take 1 tablet (0.5 mg total) by mouth at bedtime. 30 tablet 0   rosuvastatin (CRESTOR) 5 MG tablet Take 1 tablet (5 mg total) by mouth daily. For cholesterol (Patient taking differently: Take 5 mg by mouth at bedtime. For cholesterol) 90 tablet 2   Semaglutide,0.25 or 0.5MG /DOS, (OZEMPIC, 0.25 OR 0.5 MG/DOSE,) 2 MG/1.5ML SOPN Inject 0.5 mg into the skin every Friday.       lidocaine-prilocaine (EMLA) cream Apply 1 application. topically as needed (dialysis). (Patient not taking: Reported on 07/12/2022)        No current facility-administered medications for this  visit.      REVIEW OF SYSTEMS:  [X]  denotes positive finding, [ ]  denotes negative finding Cardiac   Comments:  Chest pain or chest pressure:      Shortness of breath upon exertion:      Short of breath when lying flat:      Irregular heart rhythm:             Vascular      Pain in calf, thigh, or hip brought on by ambulation: x    Pain in feet at night that wakes you up from your sleep:       Blood clot in your veins:      Leg swelling:                  PHYSICAL EXAM:    Vitals:    07/12/22 0829  BP: (!) 146/76  Pulse: 93  Resp: 14  Temp: 98.1 F (36.7 C)  TempSrc: Temporal  SpO2: 93%  Weight: 178 lb 6.4 oz (80.9 kg)  Height: 5\' 3"  (1.6 m)      GENERAL: The patient is a well-nourished female, in no acute distress. The vital signs are documented above. CARDIOVASCULAR: Patient right upper arm AV fistula.  She does have good thrill.  There is aneurysmal change in the midportion of her fistula.  She has very 1 very small area of  eschar but does not appear to be deep or at risk for significant bleed.  Skin is otherwise completely intact.  She has a approximately 7 to 8 mm vein above the antecubital space and also in her upper arm above this aneurysmal fistula change. PULMONARY: There is good air exchange  MUSCULOSKELETAL: There are no major deformities or cyanosis. NEUROLOGIC: No focal weakness or paresthesias are detected. SKIN: There are no ulcers or rashes noted. PSYCHIATRIC: The patient has a normal affect.   DATA:  Plan images with SonoSite ultrasound she does not have any evidence of any narrowing in the vein above or below the aneurysmal change   MEDICAL ISSUES: Had a long discussion with the patient regarding her fistula.  I do not see any changes that would make it unacceptable for continued use.  I did explain that she has had very good long-term result and that eventually her fistula will require conversion to a right upper arm AV graft.  I do not see any absolute indication for this currently.  I explained that if she continues to have pain with cannulation and difficulty with long runs that she would be better suited with a graft.  I would delay this as long as possible as long as she is having persistent pain or difficulty with cannulation and access runs.  I did explain that she will require placement of a new right upper arm AV graft and would require a tunneled hemodialysis catheter for approximately 1 month until the graft was healed.  She would then have use of the graft and removal of the catheter after several successful uses.  She is quite hesitant to have a tunneled catheter placed.  Safe to continue use as long as possible fistula.  She will see [place] again on needed basis       Rosetta Posner, MD FACS [facility] Tel [phone]   Note: Portions of this report may have been transcribed using voice recognition software.  Every effort has been made to ensure  accuracy; however, inadvertent computerized transcription errors may  still be present.

## 2022-08-15 NOTE — Anesthesia Postprocedure Evaluation (Signed)
Anesthesia Post Note  Patient: TINE MABEE  Procedure(s) Performed: INSERTION OF RIGHT UPPER ARM ARTERIOVENOUS (AV) GORE-TEX GRAFT (Right: Arm Upper) INSERTION OF TUNNELED DIALYSIS CATHETER (Right: Chest)  Patient location during evaluation: Phase II Anesthesia Type: General Level of consciousness: awake and alert and oriented Pain management: pain level controlled Vital Signs Assessment: post-procedure vital signs reviewed and stable Respiratory status: spontaneous breathing, nonlabored ventilation and respiratory function stable Cardiovascular status: blood pressure returned to baseline and stable Postop Assessment: no apparent nausea or vomiting Anesthetic complications: no   No notable events documented.   Last Vitals:  Vitals:   08/15/22 1115 08/15/22 1127  BP: (!) 143/71 (!) 143/58  Pulse: 83 82  Resp: 12 18  Temp:  36.6 C  SpO2: 92% 94%    Last Pain:  Vitals:   08/15/22 1100  TempSrc:   PainSc: 3                  Melia Hopes C Alp [place]

## 2022-08-15 NOTE — Progress Notes (Signed)
Dr Early reviewed chest xray.  He stated, "No pneumothorax and tip of catheter is in the correct location."

## 2022-08-15 NOTE — Op Note (Signed)
OPERATIVE REPORT  DATE OF SURGERY: 08/15/2022  PATIENT: [name], 71 y.o. 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  PHYSICIAN ASSISTANT: Vevelyn Royals, RN  The assistant was needed for exposure and to expedite the case  ANESTHESIA: MAC  EBL: per anesthesia record  Total I/O In: 600 [I.V.:600] Out: 30 [Blood:30]  BLOOD ADMINISTERED: none  DRAINS: none  SPECIMEN: none  COUNTS CORRECT:  YES  PATIENT DISPOSITION:  PACU - hemodynamically stable  PROCEDURE DETAILS: Patient was taken everyplace supine position where the area of the right and left neck and chest were imaged with SonoSite ultrasound revealing widely patent jugular veins bilaterally.  The right and left neck and chest were prepped and draped in usual sterile fashion.  Using local anesthesia and 18-gauge needle the right internal jugular vein was accessed above the clavicle.  A guidewire was placed centrally and this was confirmed with fluoroscopy.  Next the dilator and peel-away sheath was passed over the guidewire and the dilator and guidewire were removed.  A separate incision was made laterally below the clavicle and a tunnel was created from this incision to the sheath entry site.  A 23 cm tunneled catheter was brought through the tunnel.  The catheter was placed through the peel-away sheath and the peel-away sheath was removed.  The tip was placed in the distal right atrium and this was confirmed with fluoroscopy.  Both lumens flushed and aspirated easily and were locked with 1000 unit/cc heparin.  The catheter was secured to the skin with a 3-0 nylon stitch and the entry site was closed  with a 4-0 subcuticular Vicryl stitch.  Attention was then turned to the right arm.  The right arm was prepped and draped in usual sterile fashion as was the axilla.  Incision was made over the antecubital space and the fistula was identified and encircled at the level of the antecubital space just above the brachial artery anastomosis.  A separate incision was made below the deltopectoral groove and the fistula was isolated encircled at this level as well.  Finally a third incision was made over the axillary vein in the axilla.  The vein was of good caliber and was mobilized circumferentially.  The fistula was occluded with vascular clamps near the brachial artery anastomosis and at the deltopectoral groove.  The vein was ligated above the distal clamp and below the proximal clamp.  The vein was transected.  A tunnel was created from the antecubital space to the axilla lateral to the old fistula.  A 4 x 7 mm tapered graft was brought through the tunnel.  The axillary vein was occluded proximally and distally and was opened with an 11 blade and sent longitudinally with Potts scissors.  The 7 mm portion of the graft was spatulated and sewn end-to-side to the vein with a running 6-0 Prolene suture.  The graft was flushed with heparinized saline and reoccluded.  Next the graft was transected at the appropriate level and was sewn into into the old fistula just above the brachial artery anastomosis with a running 6-0 Prolene suture.  Clamps were removed and excellent thrill was noted.  The wounds were irrigated with saline.  Hemostasis  was obtained with electrocautery.  The wounds were closed with 3-0 Vicryl in the subcutaneous and subcuticular tissue.  Sterile dressing was applied and the patient was transferred to the recovery room in stable condition chest x-ray pending   Rosetta Posner, M.D., [facility] 08/15/2022 11:05 AM  Note: Portions of this report may have been transcribed using voice recognition software.   Every effort has been made to ensure accuracy; however, inadvertent computerized transcription errors may still be present.

## 2022-08-15 NOTE — Anesthesia Preprocedure Evaluation (Addendum)
Anesthesia Evaluation  Patient identified by MRN, date of birth, ID band Patient awake    Reviewed: Allergy & Precautions, NPO status , Patient's Chart, lab work & pertinent test results  Airway Mallampati: II  TM Distance: >3 FB Neck ROM: Full   Comment: Cervical spine sx Dental  (+) Dental Advisory Given, Upper Dentures   Pulmonary shortness of breath and with exertion, pneumonia, COPD,  COPD inhaler, former smoker,    Pulmonary exam normal breath sounds clear to auscultation       Cardiovascular Exercise Tolerance: Good hypertension, Pt. on medications + angina + CAD, + Past MI, + Peripheral Vascular Disease and +CHF  Normal cardiovascular exam+ dysrhythmias Atrial Fibrillation  Rhythm:Regular Rate:Normal  10/21/20  The left ventricular ejection fraction is normal (55-65%). ? Nuclear stress EF: 55%. ? There was no ST segment deviation noted during stress. ? No T wave inversion was noted during stress. ? The study is normal. ? This is a low risk study.   1. Normal study without ischemia or infarction.  2. Normal LVEF, 55%.  3. This is a low-risk study.     Neuro/Psych PSYCHIATRIC DISORDERS Anxiety  Neuromuscular disease CVA, No Residual Symptoms    GI/Hepatic Neg liver ROS, GERD  Medicated and Controlled,  Endo/Other  diabetes, Well Controlled, Type 2, Oral Hypoglycemic Agents  Renal/GU ESRF and DialysisRenal disease  negative genitourinary   Musculoskeletal  (+) Arthritis , Osteoarthritis,    Abdominal   Peds negative pediatric ROS (+)  Hematology  (+) Blood dyscrasia, anemia ,   Anesthesia Other Findings   Reproductive/Obstetrics negative OB ROS                            Anesthesia Physical Anesthesia Plan  ASA: 4  Anesthesia Plan: General   Post-op Pain Management: Dilaudid IV   Induction: Intravenous  PONV Risk Score and Plan: 4 or greater and Ondansetron  Airway  Management Planned: Nasal Cannula and Natural Airway  Additional Equipment:   Intra-op Plan:   Post-operative Plan:   Informed Consent: I have reviewed the patients History and Physical, chart, labs and discussed the procedure including the risks, benefits and alternatives for the proposed anesthesia with the patient or authorized representative who has indicated his/her understanding and acceptance.     Dental advisory given  Plan Discussed with: CRNA and Surgeon  Anesthesia Plan Comments:         Anesthesia Quick Evaluation

## 2022-08-15 NOTE — Discharge Instructions (Signed)
[facility]  Discharge Instructions  AV Fistula or Graft Surgery for Dialysis Access  Please refer to the following instructions for your post-procedure care. Your surgeon or physician assistant will discuss any changes with you.  Activity  You may drive the day following your surgery, if you are comfortable and no longer taking prescription pain medication. Resume full activity as the soreness in your incision resolves.  Bathing/Showering  You may shower after you go home. Keep your incision dry for 48 hours. Do not soak in a bathtub, hot tub, or swim until the incision heals completely. You may not shower if you have a hemodialysis catheter.  Incision Care  Clean your incision with mild soap and water after 48 hours. Pat the area dry with a clean towel. You do not need a bandage unless otherwise instructed. Do not apply any ointments or creams to your incision. You may have skin glue on your incision. Do not peel it off. It will come off on its own in about one week. Your arm may swell a bit after surgery. To reduce swelling use pillows to elevate your arm so it is above your heart. Your doctor will tell you if you need to lightly wrap your arm with an ACE bandage.  Diet  Resume your normal diet. There are not special food restrictions following this procedure. In order to heal from your surgery, it is CRITICAL to get adequate nutrition. Your body requires vitamins, minerals, and protein. Vegetables are the best source of vitamins and minerals. Vegetables also provide the perfect balance of protein. Processed food has little nutritional value, so try to avoid this.  Medications  Resume taking all of your medications. If your incision is causing pain, you may take over-the counter pain relievers such as acetaminophen (Tylenol). If you were prescribed a stronger pain medication, please be aware these medications can cause nausea and constipation. Prevent  nausea by taking the medication with a snack or meal. Avoid constipation by drinking plenty of fluids and eating foods with high amount of fiber, such as fruits, vegetables, and grains.  Do not take Tylenol if you are taking prescription pain medications.  Follow up Your surgeon may want to see you in the office following your access surgery. If so, this will be arranged at the time of your surgery.  Please call us immediately for any of the following conditions:  Increased pain, redness, drainage (pus) from your incision site Fever of 101 degrees or higher Severe or worsening pain at your incision site Hand pain or numbness.  Reduce your risk of vascular disease:  Stop smoking. If you would like help, call [facility] at 1-800-QUIT-NOW (636)171-0269) or [facility] your cholesterol Maintain a desired weight Control your diabetes Keep your blood pressure down  Dialysis  It will take several weeks to several months for your new dialysis access to be ready for use. Your surgeon will determine when it is okay to use it. Your nephrologist will continue to direct your dialysis. You can continue to use your Permcath until your new access is ready for use.   08/15/2022 [name] [id number] [birth year]  Surgeon(s): Johanne Mcglade, Arvilla Meres, MD  Procedure(s): INSERTION OF RIGHT UPPER ARM ARTERIOVENOUS (AV) GORE-TEX GRAFT INSERTION OF TUNNELED DIALYSIS CATHETER   May stick graft immediately   May stick graft on designated area only:    Do not stick graft for 4 weeks    If you have  any questions, please call the office at [phone].

## 2022-08-15 NOTE — Progress Notes (Signed)
Patient ID: BRITNAY MAGNUSSEN, female   DOB: 09-17-1951, 71 y.o.   MRN: [id number] The patient presented today for right upper arm AV Gore-Tex graft placement.  She had a degenerative right arm AV fistula.  She complained of pain in her bilateral lower extremities and reports the sensation of knots in her posterior calves bilaterally.  She has pain associated with this and reports it is worse on the left than on the right.  On physical exam of this she does have some thickening and changes in her skin that could be related to venous hypertension.  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  Does not have popliteal or distal pulses.  She has very good biphasic signal by pencil probe Doppler in the posterior tibial and peroneal bilaterally.  She did undergo recent formal noninvasive studies at [facility] heart care suggesting SFA disease with three-vessel runoff bilaterally and mild to moderate disease bilaterally.  I do not feel this would account for her current symptoms and explained this to the patient.

## 2022-08-15 NOTE — Transfer of Care (Signed)
Immediate Anesthesia Transfer of Care Note  Patient: [name]  Procedure(s) Performed: INSERTION OF RIGHT UPPER ARM ARTERIOVENOUS (AV) GORE-TEX GRAFT (Right: Arm Upper) INSERTION OF TUNNELED DIALYSIS CATHETER (Right: Chest)  Patient Location: PACU  Anesthesia Type:General  Level of Consciousness: awake, alert , oriented and patient cooperative  Airway & Oxygen Therapy: Patient Spontanous Breathing and Patient connected to nasal cannula oxygen  Post-op Assessment: Report given to RN, Post -op Vital signs reviewed and stable and Patient moving all extremities  Post vital signs: Reviewed and stable  Last Vitals:  Vitals Value Taken Time  BP 190/75 08/15/22 1045  Temp    Pulse 79 08/15/22 1049  Resp 11 08/15/22 1049  SpO2 97 % 08/15/22 1049  Vitals shown include unvalidated device data.  Last Pain:  Vitals:   08/15/22 0700  TempSrc: Oral  PainSc: 7       Patients Stated Pain Goal: 8 (29/93/71 [id number])  Complications: No notable events documented.

## 2022-08-16 ENCOUNTER — Ambulatory Visit: Payer: Medicare Other | Admitting: Cardiology

## 2022-08-17 DIAGNOSIS — N2581 Secondary hyperparathyroidism of renal origin: Secondary | ICD-10-CM | POA: Diagnosis not present

## 2022-08-17 DIAGNOSIS — D509 Iron deficiency anemia, unspecified: Secondary | ICD-10-CM | POA: Diagnosis not present

## 2022-08-17 DIAGNOSIS — N186 End stage renal disease: Secondary | ICD-10-CM | POA: Diagnosis not present

## 2022-08-17 DIAGNOSIS — Z992 Dependence on renal dialysis: Secondary | ICD-10-CM | POA: Diagnosis not present

## 2022-08-17 DIAGNOSIS — N250 Renal osteodystrophy: Secondary | ICD-10-CM | POA: Diagnosis not present

## 2022-08-19 DIAGNOSIS — N250 Renal osteodystrophy: Secondary | ICD-10-CM | POA: Diagnosis not present

## 2022-08-19 DIAGNOSIS — N2581 Secondary hyperparathyroidism of renal origin: Secondary | ICD-10-CM | POA: Diagnosis not present

## 2022-08-19 DIAGNOSIS — Z992 Dependence on renal dialysis: Secondary | ICD-10-CM | POA: Diagnosis not present

## 2022-08-19 DIAGNOSIS — N186 End stage renal disease: Secondary | ICD-10-CM | POA: Diagnosis not present

## 2022-08-19 DIAGNOSIS — D509 Iron deficiency anemia, unspecified: Secondary | ICD-10-CM | POA: Diagnosis not present

## 2022-08-21 ENCOUNTER — Telehealth: Payer: Self-pay

## 2022-08-21 ENCOUNTER — Encounter (HOSPITAL_COMMUNITY): Payer: Self-pay | Admitting: Vascular Surgery

## 2022-08-21 NOTE — Telephone Encounter (Signed)
Pt called stating that her arm is still swollen and painful after surgery last week.  Reviewed pt's chart, returned call for clarification, two identifiers used. Pt stated that she is elevating it at night, but has found it difficult to keep it elevated during the day. She is taking Tylenol for pain. She denies other symptoms. She was instructed on properly elevating, keep monitoring, and if there is no improvement by tomorrow, she is to call back and an appt will be made with Dr Donnetta Hutching for [facility]. Confirmed understanding.

## 2022-08-22 ENCOUNTER — Telehealth: Payer: Self-pay

## 2022-08-22 DIAGNOSIS — Z992 Dependence on renal dialysis: Secondary | ICD-10-CM | POA: Diagnosis not present

## 2022-08-22 DIAGNOSIS — D509 Iron deficiency anemia, unspecified: Secondary | ICD-10-CM | POA: Diagnosis not present

## 2022-08-22 DIAGNOSIS — N186 End stage renal disease: Secondary | ICD-10-CM | POA: Diagnosis not present

## 2022-08-22 DIAGNOSIS — N2581 Secondary hyperparathyroidism of renal origin: Secondary | ICD-10-CM | POA: Diagnosis not present

## 2022-08-22 DIAGNOSIS — N250 Renal osteodystrophy: Secondary | ICD-10-CM | POA: Diagnosis not present

## 2022-08-22 NOTE — Telephone Encounter (Signed)
Pt called again this week with c/o swelling with minimal improvement and pain. She states she woke up with it very tight and swollen and went to HD today and they told her to call us to get seen. Pt has been added on to MD schedule for tomorrow. She is aware of this appt.

## 2022-08-23 ENCOUNTER — Ambulatory Visit (HOSPITAL_COMMUNITY)
Admission: RE | Admit: 2022-08-23 | Discharge: 2022-08-23 | Disposition: A | Discharge status: Home / Self Care | Payer: Medicare Other | Source: Ambulatory Visit | Attending: Nurse Practitioner | Admitting: Nurse Practitioner

## 2022-08-23 ENCOUNTER — Ambulatory Visit (INDEPENDENT_AMBULATORY_CARE_PROVIDER_SITE_OTHER): Payer: Medicare Other | Admitting: Vascular Surgery

## 2022-08-23 ENCOUNTER — Encounter: Payer: Self-pay | Admitting: Vascular Surgery

## 2022-08-23 VITALS — BP 115/70 | HR 58 | Temp 98.5°F | Resp 18 | Ht 63.0 in | Wt 179.4 lb

## 2022-08-23 VITALS — BP 120/44 | HR 69 | Ht 63.0 in | Wt 181.0 lb

## 2022-08-23 DIAGNOSIS — D6869 Other thrombophilia: Secondary | ICD-10-CM | POA: Diagnosis not present

## 2022-08-23 DIAGNOSIS — I482 Chronic atrial fibrillation, unspecified: Secondary | ICD-10-CM | POA: Insufficient documentation

## 2022-08-23 DIAGNOSIS — Z79899 Other long term (current) drug therapy: Secondary | ICD-10-CM | POA: Insufficient documentation

## 2022-08-23 DIAGNOSIS — Z7901 Long term (current) use of anticoagulants: Secondary | ICD-10-CM | POA: Insufficient documentation

## 2022-08-23 DIAGNOSIS — Z0289 Encounter for other administrative examinations: Secondary | ICD-10-CM

## 2022-08-23 DIAGNOSIS — E1122 Type 2 diabetes mellitus with diabetic chronic kidney disease: Secondary | ICD-10-CM | POA: Diagnosis not present

## 2022-08-23 DIAGNOSIS — I5032 Chronic diastolic (congestive) heart failure: Secondary | ICD-10-CM | POA: Insufficient documentation

## 2022-08-23 DIAGNOSIS — I132 Hypertensive heart and chronic kidney disease with heart failure and with stage 5 chronic kidney disease, or end stage renal disease: Secondary | ICD-10-CM | POA: Insufficient documentation

## 2022-08-23 DIAGNOSIS — Z8673 Personal history of transient ischemic attack (TIA), and cerebral infarction without residual deficits: Secondary | ICD-10-CM | POA: Diagnosis not present

## 2022-08-23 DIAGNOSIS — N186 End stage renal disease: Secondary | ICD-10-CM

## 2022-08-23 DIAGNOSIS — I2510 Atherosclerotic heart disease of native coronary artery without angina pectoris: Secondary | ICD-10-CM | POA: Diagnosis not present

## 2022-08-23 DIAGNOSIS — Z992 Dependence on renal dialysis: Secondary | ICD-10-CM | POA: Insufficient documentation

## 2022-08-23 DIAGNOSIS — J449 Chronic obstructive pulmonary disease, unspecified: Secondary | ICD-10-CM | POA: Insufficient documentation

## 2022-08-23 DIAGNOSIS — Z794 Long term (current) use of insulin: Secondary | ICD-10-CM | POA: Diagnosis not present

## 2022-08-23 MED ORDER — AMIODARONE HCL 200 MG PO TABS: 200.0000 mg | ORAL_TABLET | Freq: Every day | ORAL | 3 refills | Status: DC

## 2022-08-23 NOTE — Progress Notes (Signed)
In for ekg with start of amiodarone 200 mg bid. EKg shows Vent. rate 84 BPM PR interval * ms QRS duration 90 ms QT/QTcB 418/493 ms P-R-T axes 232 -55 63 Atrial flutter with variable A-V block with premature ventricular or aberrantly conducted complexes Pulmonary disease pattern Left anterior fascicular block Septal infarct , age undetermined Abnormal ECG When compared with ECG of 02-Aug-2022 10:46, PREVIOUS ECG IS PRESENT  She states that she is having her shunt revised rt upper arm 9/12 and will need to stop anticoagulation for 2 days prior. I will see back in two weeks. She will continue amiodarone loading at 200 mg bid. After back on anticoagulation x 3 weeks I will schedule for cardioversion.

## 2022-08-23 NOTE — Progress Notes (Signed)
Primary Care Physician: [name], MD Referring Physician: Dr. Celedonio Miyamoto is a 71 y.o. female with a h/o  diastolic heart failure, coronary artery disease, end-stage renal disease on dialysis, COPD, insulin-dependent diabetes, CVA.  She also has persistent atrial fibrillation.  She feels weak and fatigued due to her atrial fibrillation.  She had a cardioversion 04/24/2022 and felt improved after her cardioversion, but unfortunately quickly went back into atrial fibrillation.    April 2023, she had an echo that showed a normal ejection fraction with elevated right atrial pressure and severe right ventricular dysfunction.  She had a left heart catheterization that showed RCA and LAD disease and had coronary atherectomy performed.  She represented to the hospital with GI bleeding.  She has had capsule endoscopy and was started on Protonix  She was seen in consult with Dr. Curt Bears 7/24. He wanted to start amiodarone as he felt that  she was not an optimal ablation candidate. However, she was on Seroquel and this needed to be stopped prior to start of amiodarone.  She is now off Seroquel for several weeks. It was suggested that she could use Cymbalta but she has not contacted PCP to start. She states she feels ok off Seroquel. I discussed risk vrs benefit of amiodarone and she is now willing to start. She has also been taking some hydroxyzine ans is aware that she will  not be able to use this going forward with amio. Qtc today 462 ms.  F/u in afib clinic, 08/23/22. She continues to  load on amiodarone 200 mg bid with reduction to one a day 09/03/22. She is in SR today. She did have her revision on her shunt 9/12 and saw Dr. Donnetta Hutching this am for continued swelling rt arm. For now, it will be watched. She has not seen in any improvement in the way she feels in SR but has noted at dialysis that her HR's have been more stable. She has now started on Cymbalta.    Today, she denies symptoms  of palpitations, chest pain, shortness of breath, orthopnea, PND, lower extremity edema, dizziness, presyncope, syncope, or neurologic sequela. The patient is tolerating medications without difficulties and is otherwise without complaint today.   Past Medical History:  Diagnosis Date   Acute on chronic diastolic CHF (congestive heart failure) ([place]) 03/05/2015   Grade 2. EF 60-65%   Anemia    hx of   Anxiety    Arthritis    CAD (coronary artery disease)    s/p Stenting in 2005;  [place] 7/09: Vigorous LV function, 2-3+ MR, RI 40%, RI stent patent, proximal-mid RCA 40-50%;  Echo 7/09:  EF 55-65%, trivial MR;  My[facility] 11/18/12: EF 66%, normal LV wall motion, mild to moderate anterior ischemia - reviewed by [facility] and felt to rep breast atten (low risk);  Echo 6/14: mild LVH, EF 55-65%, Gr 2 DD, PASP 44, trivial eff    Chronic kidney disease    CREATININE IS UP--GOING TO KIDNEY MD    Chronic neck pain    COPD (chronic obstructive pulmonary disease) (H[facility])    Degenerative joint disease    left shoulder   Diabetic neuropathy ([facility])    Diverticulosis    Esophageal stricture    GERD (gastroesophageal reflux disease)    Hyperlipidemia    Hypertension    IDDM (insulin dependent diabetes mellitus)    Pericardial effusion 03/06/2015   Very small per echo ; pleural nodules seen on CT chest.  Peripheral vascular disease ([place])    Stroke ([place])    "mini- years ago"   Vitamin D deficiency    Past Surgical History:  Procedure Laterality Date   A/V FISTULAGRAM N/A 08/10/2017   Procedure: A/V Fistulagram - Right Arm;  Surgeon: Elam Dutch, MD;  Location: [place] [facility];  Service: Cardiovascular;  Laterality: N/A;   A/V FISTULAGRAM N/A 03/03/2020   Procedure: A/V FISTULAGRAM - Right Arm;  Surgeon: Marty Heck, MD;  Location: [place] [facility];  Service: Cardiovascular;  Laterality: N/A;   ABDOMINAL HYSTERECTOMY     AV FISTULA PLACEMENT Right 07/20/2015   Procedure: RADIAL CEPHALIC ARTERIOVENOUS  FISTULA CREATION RIGHT ARM;  Surgeon: Angelia Mould, MD;  Location: [place];  Service: Vascular;  Laterality: Right;   AV FISTULA PLACEMENT Right 11/26/2015   Procedure:  Creation of RIGHT BRACHIO-CEPHALIC arteriovenous fistula;  Surgeon: Angelia Mould, MD;  Location: [place];  Service: Vascular;  Laterality: Right;   AV FISTULA PLACEMENT Right 08/15/2022   Procedure: INSERTION OF RIGHT UPPER ARM ARTERIOVENOUS (AV) GORE-TEX GRAFT;  Surgeon: Rosetta Posner, MD;  [facility];  Service: Vascular;  Laterality: Right;   BACK SURGERY     2007 & 2013   [place]  2003, 2005, 2009   1 stent   cardiac stents     CARDIOVERSION N/A 04/24/2022   Procedure: CARDIOVERSION;  Surgeon: Fay Records, MD;  Location: [facility];  Service: Cardiovascular;  Laterality: N/A;   CATARACT EXTRACTION W/PHACO Right 05/04/2014   Procedure: CATARACT EXTRACTION PHACO AND INTRAOCULAR LENS PLACEMENT ([place]);  Surgeon: Williams Che, MD;  Location: [facility];  Service: Ophthalmology;  Laterality: Right;  CDE 1.47   CATARACT EXTRACTION W/PHACO Left 07/20/2014   Procedure: CATARACT EXTRACTION WITH PHACO AND INTRAOCULAR LENS PLACEMENT LEFT EYE CDE=1.79;  Surgeon: Williams Che, MD;  Location: [facility];  Service: Ophthalmology;  Laterality: Left;   CERVICAL SPINE SURGERY  2012   8 screws   CORONARY ATHERECTOMY N/A 03/08/2022   Procedure: CORONARY ATHERECTOMY;  Surgeon: Sherren Mocha, MD;  Location: [place] [facility];  Service: Cardiovascular;  Laterality: N/A;   CYSTOSCOPY WITH INJECTION N/A 06/03/2013   Procedure: MACROPLASTIQUE  INJECTION ;  Surgeon: Malka So, MD;  Location: [facility];  Service: Urology;  Laterality: N/A;   ESOPHAGEAL DILATION Right 06/02/2022   Procedure: ESOPHAGEAL DILATION;  Surgeon: Harvel Quale, MD;  [facility];  Service: Gastroenterology;  Laterality: Right;   ESOPHAGOGASTRODUODENOSCOPY (EGD) WITH PROPOFOL N/A 06/02/2022    Procedure: ESOPHAGOGASTRODUODENOSCOPY (EGD) WITH PROPOFOL;  Surgeon: Harvel Quale, MD;  [facility];  Service: Gastroenterology;  Laterality: N/A;  TO BE DONE IN OR 2   FISTULOGRAM Right 10/20/2016   Procedure: FISTULOGRAM WITH POSSIBLE INTERVENTION;  Surgeon: [name], MD;  [facility];  Service: Vascular;  Laterality: Right;   GIVENS CAPSULE STUDY N/A 06/11/2022   Procedure: GIVENS CAPSULE STUDY;  Surgeon: Clarene Essex, MD;  Location: [place];  Service: Gastroenterology;  Laterality: N/A;   HEMOSTASIS CLIP PLACEMENT  06/02/2022   Procedure: HEMOSTASIS CLIP PLACEMENT;  Surgeon: Harvel Quale, MD;  [facility];  Service: Gastroenterology;;   HOT HEMOSTASIS  06/02/2022   Procedure: HOT HEMOSTASIS (ARGON PLASMA COAGULATION/BICAP);  Surgeon: Montez Morita, Quillian Quince, MD;  [facility];  Service: Gastroenterology;;   INSERTION OF DIALYSIS CATHETER N/A 11/26/2015   Procedure: INSERTION OF  DIALYSIS CATHETER;  Surgeon: Angelia Mould, MD;  Location: [place];  Service: Vascular;  Laterality: N/A;   INSERTION OF DIALYSIS CATHETER Right 08/15/2022   Procedure: INSERTION OF TUNNELED DIALYSIS CATHETER;  Surgeon: Rosetta Posner, MD;  [facility];  Service: Vascular;  Laterality: Right;   INTRAVASCULAR IMAGING/OCT N/A 03/08/2022   Procedure: INTRAVASCULAR IMAGING/OCT;  Surgeon: Sherren Mocha, MD;  Location: [place] [facility];  Service: Cardiovascular;  Laterality: N/A;   LAMINECTOMY  12/29/2011   LEFT HEART CATH AND CORONARY ANGIOGRAPHY N/A 03/06/2022   Procedure: LEFT HEART CATH AND CORONARY ANGIOGRAPHY;  Surgeon: Nelva Bush, MD;  Location: [place] [facility];  Service: Cardiovascular;  Laterality: N/A;   LUMBAR LAMINECTOMY/DECOMPRESSION MICRODISCECTOMY  12/29/2011   Procedure: LUMBAR LAMINECTOMY/DECOMPRESSION MICRODISCECTOMY;  Surgeon: Floyce Stakes, MD;  Location: [place] [facility];  Service: Neurosurgery;  Laterality: N/A;   Lumbar Two through Lumbar Five Laminectomies/Cellsaver   PARTIAL HYSTERECTOMY     PERIPHERAL VASCULAR BALLOON ANGIOPLASTY  03/03/2020   Procedure: PERIPHERAL VASCULAR BALLOON ANGIOPLASTY;  Surgeon: Marty Heck, MD;  Location: [place] [facility];  Service: Cardiovascular;;  Rt arm fistula   PERIPHERAL VASCULAR BALLOON ANGIOPLASTY Right 04/15/2021   Procedure: PERIPHERAL VASCULAR BALLOON ANGIOPLASTY;  Surgeon: Angelia Mould, MD;  Location: [place] [facility];  Service: Cardiovascular;  Laterality: Right;  arm fistula   PERIPHERAL VASCULAR BALLOON ANGIOPLASTY  03/14/2022   Procedure: PERIPHERAL VASCULAR BALLOON ANGIOPLASTY;  Surgeon: Broadus John, MD;  Location: [place] [facility];  Service: Cardiovascular;;  right arm AVF   PERIPHERAL VASCULAR CATHETERIZATION N/A 11/22/2015   Procedure: Fistulagram;  Surgeon: Angelia Mould, MD;  Location: [place] [facility];  Service: Cardiovascular;  Laterality: N/A;   PERIPHERAL VASCULAR INTERVENTION  08/10/2017   Procedure: PERIPHERAL VASCULAR INTERVENTION;  Surgeon: Elam Dutch, MD;  Location: [place] [facility];  Service: Cardiovascular;;   SHOULDER SURGERY Right    Rotator cuff    Current Outpatient Medications  Medication Sig Dispense Refill   acetaminophen (TYLENOL) 500 MG tablet Take 1,000 mg by mouth every 6 (six) hours as needed for moderate pain or headache.     albuterol (PROVENTIL) (2.5 MG/3ML) 0.083% nebulizer solution Take 3 mLs (2.5 mg total) by nebulization every 6 (six) hours as needed for wheezing or shortness of breath. 75 mL 12   apixaban (ELIQUIS) 5 MG TABS tablet Take 1 tablet (5 mg total) by mouth 2 (two) times daily. 180 tablet 3   aspirin EC 81 MG tablet Take 1 tablet (81 mg total) by mouth daily. Swallow whole. 30 tablet 12   atenolol (TENORMIN) 100 MG tablet Take 0.5 tablets (50 mg total) by mouth at bedtime. 90 tablet 3   budesonide-formoterol (SYMBICORT) 160-4.5 MCG/ACT inhaler Inhale 2 puffs into the  lungs 2 (two) times daily. (Patient taking differently: Inhale 2 puffs into the lungs 2 (two) times daily as needed (shortness of breath).) 3 each 4   calcium acetate (PHOSLO) 667 MG capsule Take 667-1,334 mg by mouth See admin instructions. Take 1334 mg with meals 3 times daily and 667 mg with snacks     Camphor-Menthol-Methyl Sal (SALONPAS) 3.12-09-08 % PTCH Place 1 patch onto the skin daily as needed (pain).     Carboxymethylcellul-Glycerin (LUBRICATING EYE DROPS OP) Place 1 drop into both eyes daily as needed (dry eyes).     cetirizine (ZYRTEC) 10 MG tablet Take 1 tablet (10 mg total) by mouth daily as needed for allergies. (Patient taking differently: Take  10 mg by mouth daily.) 90 tablet 1   Continuous Blood Gluc Receiver (FREESTYLE LIBRE 2 READER) DEVI USE TO TEST BLOOD SUGAR CONTINUOUSLY 1 each 2   Continuous Blood Gluc Sensor (FREESTYLE LIBRE 2 SENSOR) MISC Use multiple times daily to track glucose to prevent highs and lows as a complication of dialysis Dx:Z99.2, E11.59 2 each 12   diltiazem (CARDIZEM CD) 240 MG 24 hr capsule Take 1 capsule (240 mg total) by mouth daily. 90 capsule 3   DULoxetine (CYMBALTA) 30 MG capsule Take 1 capsule (30 mg total) by mouth daily. With supper 30 capsule 0   Ferrous Fumarate (HEMOCYTE - 106 MG FE) 324 (106 Fe) MG TABS tablet Take 1 tablet (106 mg of iron total) by mouth daily. 30 tablet 11   gabapentin (NEURONTIN) 400 MG capsule TAKE 2 CAPSULES AFTER EACH DIALYSIS SESSION (Patient taking differently: Take 800 mg by mouth See admin instructions. Take 800 mg by mouth Tuesday, Thursday and Saturday after dialysis) 30 capsule 1   glucose blood (ONETOUCH ULTRA) test strip Use to test blood sugar 3 times daily as directed. DX: E11.9 300 each 12   insulin aspart (NOVOLOG FLEXPEN) 100 UNIT/ML FlexPen Inject 1 Units into the skin 3 (three) times daily with meals. < 150: 0 units. 150-199:1 unit. 200-249: 2 units. 250-299: 3 units. 300-349: 4 units. Over 350: 5 units 15 mL  11   insulin degludec (TRESIBA FLEXTOUCH) 100 UNIT/ML FlexTouch Pen Inject 20 Units into the skin at bedtime. Via novo nordisk patient assistance program (Patient taking differently: Inject 30 Units into the skin at bedtime. Via novo nordisk patient assistance program)     lidocaine (LIDODERM) 5 % Place 1 patch onto the skin daily. Remove & Discard patch within 12 hours or as directed by MD 30 patch 0   multivitamin (RENA-VIT) TABS tablet Take 1 tablet by mouth daily.     nitroGLYCERIN (NITROSTAT) 0.4 MG SL tablet Place 1 tablet (0.4 mg total) under the tongue every 5 (five) minutes as needed for chest pain. 25 tablet 11   ondansetron (ZOFRAN) 4 MG tablet TAKE 1 TABLET BY MOUTH EVERY 8 HOURS AS NEEDED FOR NAUSEA AND VOMITING 20 tablet 5   oxyCODONE-acetaminophen (PERCOCET) 5-325 MG tablet Take 1 tablet by mouth every 6 (six) hours as needed for severe pain. 8 tablet 0   pantoprazole (PROTONIX) 40 MG tablet Take 1 tablet (40 mg total) by mouth 2 (two) times daily. 120 tablet 0   polyethylene glycol (MIRALAX / GLYCOLAX) 17 g packet Take 17 g by mouth daily as needed for moderate constipation or mild constipation. 14 each 0   rOPINIRole (REQUIP) 0.5 MG tablet Take 1 tablet (0.5 mg total) by mouth at bedtime. 30 tablet 0   rOPINIRole (REQUIP) 1 MG tablet Take 1 mg by mouth at bedtime.     rosuvastatin (CRESTOR) 5 MG tablet Take 1 tablet (5 mg total) by mouth daily. For cholesterol 90 tablet 2   Semaglutide,0.25 or 0.5MG /DOS, (OZEMPIC, 0.25 OR 0.5 MG/DOSE,) 2 MG/1.5ML SOPN Inject 0.5 mg into the skin every Friday.     torsemide (DEMADEX) 20 MG tablet Take 40 mg by mouth daily.     amiodarone (PACERONE) 200 MG tablet Take 1 tablet (200 mg total) by mouth daily. 30 tablet 3   No current facility-administered medications for this encounter.    Allergies  Allergen Reactions   Azithromycin     Prolonged QT   Ace Inhibitors Cough   Clopidogrel Bisulfate Other (See  Comments)    Sick, Headache, "Felt  terrible"   Codeine Other (See Comments)    hallucinations   Lisinopril Cough   Penicillins Other (See Comments)    Unknown childhood reaction Did it involve swelling of the face/tongue/throat, SOB, or low BP? Unknown Did it involve sudden or severe rash/hives, skin peeling, or any reaction on the inside of your mouth or nose? Unknown Did you need to seek medical attention at a hospital or doctor's office? Unknown When did it last happen?      childhood allergy If all above answers are "NO", may proceed with cephalosporin use.      Social History   Socioeconomic History   Marital status: Widowed    Spouse name: Not on file   Number of children: 2   Years of education: GED   Highest education level: GED or equivalent  Occupational History   Occupation: Disabled  Tobacco Use   Smoking status: Former    Packs/day: 1.00    Years: 30.00    Total pack years: 30.00    Types: Cigarettes    Quit date: 12/04/2001    Years since quitting: 20.7    Passive exposure: Never   Smokeless tobacco: Never  Vaping Use   Vaping Use: Never used  Substance and Sexual Activity   Alcohol use: No    Alcohol/week: 6.0 standard drinks of alcohol    Types: 6 Cans of beer per week   Drug use: No   Sexual activity: Not Currently    Birth control/protection: Surgical  Other Topics Concern   Not on file  Social History Narrative   Lives in [place] with mother   Daughters live nearby   Social Determinants of Health   Financial Resource Strain: [place]  (07/11/2022)   Overall Financial Resource Strain (CARDIA)    Difficulty of Paying Living Expenses: Not hard at all  Food Insecurity: No Food Insecurity (07/11/2022)   Hunger Vital Sign    Worried About Running Out of Food in the Last Year: Never true    [place] in the Last Year: Never true  Transportation Needs: No Transportation Needs (07/11/2022)   PRAPARE - Hydrologist (Medical): No    Lack of  Transportation (Non-Medical): No  Physical Activity: Inactive (12/16/2021)   Exercise Vital Sign    Days of Exercise per Week: 0 days    Minutes of Exercise per Session: 0 min  Stress: No Stress Concern Present (07/11/2022)   Luce    Feeling of Stress : Only a little  Social Connections: Moderately Isolated (07/11/2022)   Social Connection and Isolation Panel [NHANES]    Frequency of Communication with Friends and Family: More than three times a week    Frequency of Social Gatherings with Friends and Family: More than three times a week    Attends Religious Services: More than 4 times per year    Active Member of [employer] or Organizations: No    Attends Archivist Meetings: Never    Marital Status: Widowed  Intimate Partner Violence: Not At Risk (07/11/2022)   Humiliation, Afraid, Rape, and Kick questionnaire    Fear of Current or Ex-Partner: No    Emotionally Abused: No    Physically Abused: No    Sexually Abused: No    Family History  Problem Relation Age of Onset   Heart disease Other    Hypertension Father  Cancer Father    Hypertension Mother    Hypertension Daughter    Pancreatitis Brother    Hypertension Daughter    Diabetes Daughter     ROS- All systems are reviewed and negative except as per the HPI above  Physical Exam: Vitals:   08/23/22 1102  BP: (!) 120/44  Pulse: 69  Weight: 82.1 kg  Height: 5\' 3"  (1.6 m)   Wt Readings from Last 3 Encounters:  08/23/22 82.1 kg  08/23/22 81.4 kg  08/15/22 80.7 kg    Labs: Lab Results  Component Value Date   NA 137 08/15/2022   K 3.6 08/15/2022   CL 93 (L) 08/15/2022   CO2 31 08/15/2022   GLUCOSE 171 (H) 08/15/2022   BUN 29 (H) 08/15/2022   CREATININE 7.04 (H) 08/15/2022   CALCIUM 9.2 08/15/2022   PHOS 3.0 06/15/2022   MG 2.0 06/10/2022   Lab Results  Component Value Date   INR 1.9 (H) 03/26/2022   Lab Results  Component Value  Date   CHOL 127 06/28/2022   HDL 42 06/28/2022   LDLCALC 66 06/28/2022   TRIG 100 06/28/2022     GEN- The patient is well appearing, alert and oriented x 3 today.   Head- normocephalic, atraumatic Eyes-  Sclera clear, conjunctiva pink Ears- hearing intact Oropharynx- clear Neck- supple, no JVP Lymph- no cervical lymphadenopathy Lungs- Clear to ausculation bilaterally, normal work of breathing Heart- Regular rate and rhythm, no murmurs, rubs or gallops, PMI not laterally displaced GI- soft, NT, ND, + BS Extremities- no clubbing, cyanosis, or+ edema rt arm MS- no significant deformity or atrophy Skin- no rash or lesion Psych- euthymic mood, full affect Neuro- strength and sensation are intact   Echo-Vent. rate 69 BPM PR interval 184 ms QRS duration 100 ms QT/QTcB 478/512 ms P-R-T axes 45 40 31 Sinus rhythm with frequent Premature ventricular complexes Septal infarct , age undetermined Prolonged QT ( can tolerate up to 600 ms on amiodarone) Abnormal ECG When compared with ECG of 09-Aug-2022 11:57, PREVIOUS ECG IS PRESENT   Assessment and Plan:  1. Afib She continues  on amiodarone 200 mg bid  and is in SR but will decrease to 200 mg daily 10/1  I will bring back two weeks for EKG/BP She is aware of no benadryl, no hydroxyzine and no Seroquel She is now on Cymbalta Continue eliquis 5 mg bid   2. Revision of shunt rt arm Swelling of rt arm Checked by Dr. Donnetta Hutching this am, for now wait and watch    Geroge Baseman. Samanthan Dugo, [facility] [address] [place], [place] [address] [phone]

## 2022-08-23 NOTE — Patient Instructions (Signed)
October 1st - reduce Amiodarone to 200mg  once a day

## 2022-08-23 NOTE — Progress Notes (Signed)
[facility] [place]  Patient name: [name] MRN: [id number] DOB: 02-05-1951 Sex: female  REASON FOR VISIT: Follow-up recent right upper arm AV Gore-Tex graft placement  HPI: [name] is a 71 y.o. female here today for follow-up.  She had conversion of right upper arm fistula to right upper arm AV Gore-Tex graft by myself on 08/15/2022 at [facility].  She called with request to being seen due to swelling  Current Outpatient Medications  Medication Sig Dispense Refill   acetaminophen (TYLENOL) 500 MG tablet Take 1,000 mg by mouth every 6 (six) hours as needed for moderate pain or headache.     albuterol (PROVENTIL) (2.5 MG/3ML) 0.083% nebulizer solution Take 3 mLs (2.5 mg total) by nebulization every 6 (six) hours as needed for wheezing or shortness of breath. 75 mL 12   amiodarone (PACERONE) 200 MG tablet Take 1 tablet by mouth twice a day for 1 month then reduce to 1 tablet daily 60 tablet 0   apixaban (ELIQUIS) 5 MG TABS tablet Take 1 tablet (5 mg total) by mouth 2 (two) times daily. 180 tablet 3   aspirin EC 81 MG tablet Take 1 tablet (81 mg total) by mouth daily. Swallow whole. 30 tablet 12   atenolol (TENORMIN) 100 MG tablet Take 0.5 tablets (50 mg total) by mouth at bedtime. 90 tablet 3   budesonide-formoterol (SYMBICORT) 160-4.5 MCG/ACT inhaler Inhale 2 puffs into the lungs 2 (two) times daily. (Patient taking differently: Inhale 2 puffs into the lungs 2 (two) times daily as needed (shortness of breath).) 3 each 4   calcium acetate (PHOSLO) 667 MG capsule Take 667-1,334 mg by mouth See admin instructions. Take 1334 mg with meals 3 times daily and 667 mg with snacks     Camphor-Menthol-Methyl Sal (SALONPAS) 3.12-09-08 % PTCH Place 1 patch onto the skin daily as needed (pain).     Carboxymethylcellul-Glycerin (LUBRICATING EYE DROPS OP) Place 1 drop into both eyes daily as needed (dry eyes).     cetirizine (ZYRTEC)  10 MG tablet Take 1 tablet (10 mg total) by mouth daily as needed for allergies. (Patient taking differently: Take 10 mg by mouth daily.) 90 tablet 1   Continuous Blood Gluc Receiver (FREESTYLE LIBRE 2 READER) DEVI USE TO TEST BLOOD SUGAR CONTINUOUSLY 1 each 2   Continuous Blood Gluc Sensor (FREESTYLE LIBRE 2 SENSOR) MISC Use multiple times daily to track glucose to prevent highs and lows as a complication of dialysis Dx:Z99.2, E11.59 2 each 12   diltiazem (CARDIZEM CD) 240 MG 24 hr capsule Take 1 capsule (240 mg total) by mouth daily. 90 capsule 3   DULoxetine (CYMBALTA) 30 MG capsule Take 1 capsule (30 mg total) by mouth daily. With supper 30 capsule 0   Ferrous Fumarate (HEMOCYTE - 106 MG FE) 324 (106 Fe) MG TABS tablet Take 1 tablet (106 mg of iron total) by mouth daily. 30 tablet 11   gabapentin (NEURONTIN) 400 MG capsule TAKE 2 CAPSULES AFTER EACH DIALYSIS SESSION (Patient taking differently: Take 800 mg by mouth See admin instructions. Take 800 mg by mouth Tuesday, Thursday and Saturday after dialysis) 30 capsule 1   glucose blood (ONETOUCH ULTRA) test strip Use to test blood sugar 3 times daily as directed. DX: E11.9 300 each 12   insulin aspart (NOVOLOG FLEXPEN) 100 UNIT/ML FlexPen Inject 1 Units into the skin 3 (three) times daily with meals. < 150: 0 units. 150-199:1 unit. 200-249: 2 units. 250-299: 3 units.  300-349: 4 units. Over 350: 5 units 15 mL 11   insulin degludec (TRESIBA FLEXTOUCH) 100 UNIT/ML FlexTouch Pen Inject 20 Units into the skin at bedtime. Via novo nordisk patient assistance program (Patient taking differently: Inject 30 Units into the skin at bedtime. Via novo nordisk patient assistance program)     lidocaine (LIDODERM) 5 % Place 1 patch onto the skin daily. Remove & Discard patch within 12 hours or as directed by MD 30 patch 0   multivitamin (RENA-VIT) TABS tablet Take 1 tablet by mouth daily.     nitroGLYCERIN (NITROSTAT) 0.4 MG SL tablet Place 1 tablet (0.4 mg total)  under the tongue every 5 (five) minutes as needed for chest pain. 25 tablet 11   ondansetron (ZOFRAN) 4 MG tablet TAKE 1 TABLET BY MOUTH EVERY 8 HOURS AS NEEDED FOR NAUSEA AND VOMITING 20 tablet 5   oxyCODONE-acetaminophen (PERCOCET) 5-325 MG tablet Take 1 tablet by mouth every 6 (six) hours as needed for severe pain. 8 tablet 0   polyethylene glycol (MIRALAX / GLYCOLAX) 17 g packet Take 17 g by mouth daily as needed for moderate constipation or mild constipation. 14 each 0   rOPINIRole (REQUIP) 1 MG tablet Take 1 mg by mouth at bedtime.     rosuvastatin (CRESTOR) 5 MG tablet Take 1 tablet (5 mg total) by mouth daily. For cholesterol 90 tablet 2   Semaglutide,0.25 or 0.5MG /DOS, (OZEMPIC, 0.25 OR 0.5 MG/DOSE,) 2 MG/1.5ML SOPN Inject 0.5 mg into the skin every Friday.     torsemide (DEMADEX) 20 MG tablet Take 40 mg by mouth daily.     pantoprazole (PROTONIX) 40 MG tablet Take 1 tablet (40 mg total) by mouth 2 (two) times daily. 120 tablet 0   rOPINIRole (REQUIP) 0.5 MG tablet Take 1 tablet (0.5 mg total) by mouth at bedtime. (Patient not taking: Reported on 08/08/2022) 30 tablet 0   No current facility-administered medications for this visit.     PHYSICAL EXAM: Vitals:   08/23/22 0903  BP: 115/70  Pulse: (!) 58  Resp: 18  Temp: 98.5 F (36.9 C)  TempSrc: Temporal  SpO2: 94%  Weight: 179 lb 6.4 oz (81.4 kg)  Height: 5\' 3"  (1.6 m)    GENERAL: The patient is a well-nourished female, in no acute distress. The vital signs are documented above. She has an excellent thrill and bruit in her upper arm graft.  All her surgical incisions look good with no evidence of healing difficulty.  She does have more than the usual amount of swelling in her hand and forearm but reports this is improving.  She does have typical inflammation at the Gore-Tex graft thymic site.  MEDICAL ISSUES: Stable status post new right upper arm AV graft on 08/15/2020.  We will see her again in 3 weeks for continued  follow-up   Rosetta Posner, MD [facility] [phone]  Note: Portions of this report may have been transcribed using voice recognition software.  Every effort has been made to ensure accuracy; however, inadvertent computerized transcription errors may still be present.

## 2022-08-24 DIAGNOSIS — D509 Iron deficiency anemia, unspecified: Secondary | ICD-10-CM | POA: Diagnosis not present

## 2022-08-24 DIAGNOSIS — N186 End stage renal disease: Secondary | ICD-10-CM | POA: Diagnosis not present

## 2022-08-24 DIAGNOSIS — Z992 Dependence on renal dialysis: Secondary | ICD-10-CM | POA: Diagnosis not present

## 2022-08-24 DIAGNOSIS — N2581 Secondary hyperparathyroidism of renal origin: Secondary | ICD-10-CM | POA: Diagnosis not present

## 2022-08-24 DIAGNOSIS — N250 Renal osteodystrophy: Secondary | ICD-10-CM | POA: Diagnosis not present

## 2022-08-26 DIAGNOSIS — N2581 Secondary hyperparathyroidism of renal origin: Secondary | ICD-10-CM | POA: Diagnosis not present

## 2022-08-26 DIAGNOSIS — Z992 Dependence on renal dialysis: Secondary | ICD-10-CM | POA: Diagnosis not present

## 2022-08-26 DIAGNOSIS — D509 Iron deficiency anemia, unspecified: Secondary | ICD-10-CM | POA: Diagnosis not present

## 2022-08-26 DIAGNOSIS — N186 End stage renal disease: Secondary | ICD-10-CM | POA: Diagnosis not present

## 2022-08-26 DIAGNOSIS — N250 Renal osteodystrophy: Secondary | ICD-10-CM | POA: Diagnosis not present

## 2022-08-29 DIAGNOSIS — N186 End stage renal disease: Secondary | ICD-10-CM | POA: Diagnosis not present

## 2022-08-29 DIAGNOSIS — N2581 Secondary hyperparathyroidism of renal origin: Secondary | ICD-10-CM | POA: Diagnosis not present

## 2022-08-29 DIAGNOSIS — N250 Renal osteodystrophy: Secondary | ICD-10-CM | POA: Diagnosis not present

## 2022-08-29 DIAGNOSIS — D509 Iron deficiency anemia, unspecified: Secondary | ICD-10-CM | POA: Diagnosis not present

## 2022-08-29 DIAGNOSIS — Z992 Dependence on renal dialysis: Secondary | ICD-10-CM | POA: Diagnosis not present

## 2022-08-31 DIAGNOSIS — N2581 Secondary hyperparathyroidism of renal origin: Secondary | ICD-10-CM | POA: Diagnosis not present

## 2022-08-31 DIAGNOSIS — D509 Iron deficiency anemia, unspecified: Secondary | ICD-10-CM | POA: Diagnosis not present

## 2022-08-31 DIAGNOSIS — N186 End stage renal disease: Secondary | ICD-10-CM | POA: Diagnosis not present

## 2022-08-31 DIAGNOSIS — Z992 Dependence on renal dialysis: Secondary | ICD-10-CM | POA: Diagnosis not present

## 2022-08-31 DIAGNOSIS — N250 Renal osteodystrophy: Secondary | ICD-10-CM | POA: Diagnosis not present

## 2022-09-02 DIAGNOSIS — D509 Iron deficiency anemia, unspecified: Secondary | ICD-10-CM | POA: Diagnosis not present

## 2022-09-02 DIAGNOSIS — N2581 Secondary hyperparathyroidism of renal origin: Secondary | ICD-10-CM | POA: Diagnosis not present

## 2022-09-02 DIAGNOSIS — Z992 Dependence on renal dialysis: Secondary | ICD-10-CM | POA: Diagnosis not present

## 2022-09-02 DIAGNOSIS — N250 Renal osteodystrophy: Secondary | ICD-10-CM | POA: Diagnosis not present

## 2022-09-02 DIAGNOSIS — N186 End stage renal disease: Secondary | ICD-10-CM | POA: Diagnosis not present

## 2022-09-03 ENCOUNTER — Other Ambulatory Visit: Payer: Self-pay | Admitting: Family Medicine

## 2022-09-05 ENCOUNTER — Ambulatory Visit (INDEPENDENT_AMBULATORY_CARE_PROVIDER_SITE_OTHER): Payer: Medicare Other | Admitting: Physician Assistant

## 2022-09-05 ENCOUNTER — Telehealth: Payer: Self-pay

## 2022-09-05 VITALS — BP 110/67 | HR 85 | Temp 97.7°F | Ht 63.0 in | Wt 176.8 lb

## 2022-09-05 DIAGNOSIS — N2581 Secondary hyperparathyroidism of renal origin: Secondary | ICD-10-CM | POA: Diagnosis not present

## 2022-09-05 DIAGNOSIS — E559 Vitamin D deficiency, unspecified: Secondary | ICD-10-CM | POA: Diagnosis not present

## 2022-09-05 DIAGNOSIS — N186 End stage renal disease: Secondary | ICD-10-CM

## 2022-09-05 DIAGNOSIS — Z23 Encounter for immunization: Secondary | ICD-10-CM | POA: Diagnosis not present

## 2022-09-05 DIAGNOSIS — N250 Renal osteodystrophy: Secondary | ICD-10-CM | POA: Diagnosis not present

## 2022-09-05 DIAGNOSIS — T82848A Pain from vascular prosthetic devices, implants and grafts, initial encounter: Secondary | ICD-10-CM | POA: Diagnosis not present

## 2022-09-05 DIAGNOSIS — D631 Anemia in chronic kidney disease: Secondary | ICD-10-CM | POA: Diagnosis not present

## 2022-09-05 DIAGNOSIS — T82898A Other specified complication of vascular prosthetic devices, implants and grafts, initial encounter: Secondary | ICD-10-CM | POA: Diagnosis not present

## 2022-09-05 DIAGNOSIS — Z992 Dependence on renal dialysis: Secondary | ICD-10-CM

## 2022-09-05 DIAGNOSIS — D509 Iron deficiency anemia, unspecified: Secondary | ICD-10-CM | POA: Diagnosis not present

## 2022-09-05 NOTE — Telephone Encounter (Signed)
Joy at [facility] in [place] called stating the pt has a large hematoma at her AVG site. She is completing HD treatment through her [facility]. She states the skin is shiny, taut, and there is concern that it may rupture. Appt requested and scheduled for post-op f/u. Confirmed understanding.

## 2022-09-05 NOTE — Progress Notes (Signed)
POST OPERATIVE OFFICE NOTE    CC:  F/u for surgery  HPI:  This is a 71 y.o. female who is s/p  #1 placement of right IJ tunneled hemodialysis catheter, #2 ligation of right upper arm AV fistula and placement of right upper arm AV Gore-Tex graft on 08/15/22 by Dr. Donnetta Hutching for pain with HD and prolonged bleeding episodes.   She is ESRD with a history of right UE AV fistula that lasted 7 years.  She has undergone several endovascular interventions to maintain a working fistula.    Pt returns today for follow up.  Pt states she had swelling post op.  The arm swelling has dissipated.  She has now developed a residual "blood blister over the old fistula stick site scar.   She denies drainage, fever and chills.  No symptoms of steal.     Allergies  Allergen Reactions   Azithromycin     Prolonged QT   Ace Inhibitors Cough   Clopidogrel Bisulfate Other (See Comments)    Sick, Headache, "Felt terrible"   Codeine Other (See Comments)    hallucinations   Lisinopril Cough   Penicillins Other (See Comments)    Unknown childhood reaction Did it involve swelling of the face/tongue/throat, SOB, or low BP? Unknown Did it involve sudden or severe rash/hives, skin peeling, or any reaction on the inside of your mouth or nose? Unknown Did you need to seek medical attention at a hospital or doctor's office? Unknown When did it last happen?      childhood allergy If all above answers are "NO", may proceed with cephalosporin use.      Current Outpatient Medications  Medication Sig Dispense Refill   acetaminophen (TYLENOL) 500 MG tablet Take 1,000 mg by mouth every 6 (six) hours as needed for moderate pain or headache.     albuterol (PROVENTIL) (2.5 MG/3ML) 0.083% nebulizer solution Take 3 mLs (2.5 mg total) by nebulization every 6 (six) hours as needed for wheezing or shortness of breath. 75 mL 12   amiodarone (PACERONE) 200 MG tablet Take 1 tablet (200 mg total) by mouth daily. 30 tablet 3   apixaban  (ELIQUIS) 5 MG TABS tablet Take 1 tablet (5 mg total) by mouth 2 (two) times daily. 180 tablet 3   aspirin EC 81 MG tablet Take 1 tablet (81 mg total) by mouth daily. Swallow whole. 30 tablet 12   atenolol (TENORMIN) 100 MG tablet Take 0.5 tablets (50 mg total) by mouth at bedtime. 90 tablet 3   budesonide-formoterol (SYMBICORT) 160-4.5 MCG/ACT inhaler Inhale 2 puffs into the lungs 2 (two) times daily. (Patient taking differently: Inhale 2 puffs into the lungs 2 (two) times daily as needed (shortness of breath).) 3 each 4   calcium acetate (PHOSLO) 667 MG capsule Take 667-1,334 mg by mouth See admin instructions. Take 1334 mg with meals 3 times daily and 667 mg with snacks     Camphor-Menthol-Methyl Sal (SALONPAS) 3.12-09-08 % PTCH Place 1 patch onto the skin daily as needed (pain).     Carboxymethylcellul-Glycerin (LUBRICATING EYE DROPS OP) Place 1 drop into both eyes daily as needed (dry eyes).     cetirizine (ZYRTEC) 10 MG tablet Take 1 tablet (10 mg total) by mouth daily as needed for allergies. (Patient taking differently: Take 10 mg by mouth daily.) 90 tablet 1   Continuous Blood Gluc Receiver (FREESTYLE LIBRE 2 READER) DEVI USE TO TEST BLOOD SUGAR CONTINUOUSLY 1 each 2   Continuous Blood Gluc Sensor (FREESTYLE LIBRE 2  SENSOR) MISC Use multiple times daily to track glucose to prevent highs and lows as a complication of dialysis Dx:Z99.2, E11.59 2 each 12   diltiazem (CARDIZEM CD) 240 MG 24 hr capsule Take 1 capsule (240 mg total) by mouth daily. 90 capsule 3   DULoxetine (CYMBALTA) 30 MG capsule TAKE 1 CAPSULE BY MOUTH ONCE DAILY WITH SUPPER 30 capsule 0   Ferrous Fumarate (HEMOCYTE - 106 MG FE) 324 (106 Fe) MG TABS tablet Take 1 tablet (106 mg of iron total) by mouth daily. 30 tablet 11   gabapentin (NEURONTIN) 400 MG capsule TAKE 2 CAPSULES AFTER EACH DIALYSIS SESSION (Patient taking differently: Take 800 mg by mouth See admin instructions. Take 800 mg by mouth Tuesday, Thursday and Saturday after  dialysis) 30 capsule 1   glucose blood (ONETOUCH ULTRA) test strip Use to test blood sugar 3 times daily as directed. DX: E11.9 300 each 12   insulin aspart (NOVOLOG FLEXPEN) 100 UNIT/ML FlexPen Inject 1 Units into the skin 3 (three) times daily with meals. < 150: 0 units. 150-199:1 unit. 200-249: 2 units. 250-299: 3 units. 300-349: 4 units. Over 350: 5 units 15 mL 11   insulin degludec (TRESIBA FLEXTOUCH) 100 UNIT/ML FlexTouch Pen Inject 20 Units into the skin at bedtime. Via novo nordisk patient assistance program (Patient taking differently: Inject 30 Units into the skin at bedtime. Via novo nordisk patient assistance program)     lidocaine (LIDODERM) 5 % Place 1 patch onto the skin daily. Remove & Discard patch within 12 hours or as directed by MD 30 patch 0   multivitamin (RENA-VIT) TABS tablet Take 1 tablet by mouth daily.     nitroGLYCERIN (NITROSTAT) 0.4 MG SL tablet Place 1 tablet (0.4 mg total) under the tongue every 5 (five) minutes as needed for chest pain. 25 tablet 11   ondansetron (ZOFRAN) 4 MG tablet TAKE 1 TABLET BY MOUTH EVERY 8 HOURS AS NEEDED FOR NAUSEA AND VOMITING 20 tablet 5   oxyCODONE-acetaminophen (PERCOCET) 5-325 MG tablet Take 1 tablet by mouth every 6 (six) hours as needed for severe pain. 8 tablet 0   pantoprazole (PROTONIX) 40 MG tablet Take 1 tablet (40 mg total) by mouth 2 (two) times daily. 120 tablet 0   polyethylene glycol (MIRALAX / GLYCOLAX) 17 g packet Take 17 g by mouth daily as needed for moderate constipation or mild constipation. 14 each 0   rOPINIRole (REQUIP) 0.5 MG tablet Take 1 tablet (0.5 mg total) by mouth at bedtime. 30 tablet 0   rOPINIRole (REQUIP) 1 MG tablet Take 1 mg by mouth at bedtime.     rosuvastatin (CRESTOR) 5 MG tablet Take 1 tablet (5 mg total) by mouth daily. For cholesterol 90 tablet 2   Semaglutide,0.25 or 0.5MG /DOS, (OZEMPIC, 0.25 OR 0.5 MG/DOSE,) 2 MG/1.5ML SOPN Inject 0.5 mg into the skin every Friday.     torsemide (DEMADEX) 20 MG  tablet Take 40 mg by mouth daily.     No current facility-administered medications for this visit.     ROS:  See HPI  Physical Exam:     Incision:  Incisions have healed well and there is a palpable thrill   Extremities:  palpable radial pulse, sensation intact and grip 5/5.  No apparent edema in the left UE.  Old stick site blood blister.  No erythema or drainage.     Assessment/Plan:  This is a 71 y.o. female who is s/p: #1 placement of right IJ tunneled hemodialysis catheter, #2 ligation of  right upper arm AV fistula and placement of right upper arm AV Gore-Tex graft on 08/15/22 by Dr. Donnetta Hutching   I discussed the area in question with Dr. Carlis Abbott.  We recommended protective dressing an watchful waiting until the graft is incorporated to prevent possible infection exposure.   She has an appointment with Dr. Donnetta Hutching tomorrow for follow up.     Roxy Horseman PA-C [facility] [phone]   Clinic MD:  Stanford Breed

## 2022-09-06 ENCOUNTER — Ambulatory Visit (HOSPITAL_COMMUNITY)
Admission: RE | Admit: 2022-09-06 | Discharge: 2022-09-06 | Disposition: A | Discharge status: Home / Self Care | Payer: Medicare Other | Source: Ambulatory Visit | Attending: Nurse Practitioner | Admitting: Nurse Practitioner

## 2022-09-06 ENCOUNTER — Ambulatory Visit (INDEPENDENT_AMBULATORY_CARE_PROVIDER_SITE_OTHER): Payer: Medicare Other | Admitting: Vascular Surgery

## 2022-09-06 ENCOUNTER — Encounter: Payer: Self-pay | Admitting: Vascular Surgery

## 2022-09-06 VITALS — BP 112/60 | HR 71

## 2022-09-06 VITALS — BP 126/66 | HR 70 | Temp 98.2°F | Ht 63.0 in | Wt 179.8 lb

## 2022-09-06 DIAGNOSIS — I484 Atypical atrial flutter: Secondary | ICD-10-CM | POA: Diagnosis not present

## 2022-09-06 DIAGNOSIS — N186 End stage renal disease: Secondary | ICD-10-CM

## 2022-09-06 DIAGNOSIS — D6869 Other thrombophilia: Secondary | ICD-10-CM | POA: Insufficient documentation

## 2022-09-06 DIAGNOSIS — Z992 Dependence on renal dialysis: Secondary | ICD-10-CM

## 2022-09-06 MED ORDER — AMIODARONE HCL 200 MG PO TABS: ORAL_TABLET | ORAL | 0 refills | Status: AC

## 2022-09-06 NOTE — Progress Notes (Signed)
[facility] [place]  Patient name: [name] MRN: [id number] DOB: 1951-03-17 Sex: female  REASON FOR VISIT: Follow-up right arm AV Gore-Tex graft  HPI: [name] is a 71 y.o. female here today for follow-up.  She underwent conversion of a righ upper arm AV fistula to right upper arm AV Gore-Tex graft by myself at [facility] on 08/15/2022.  She has had difficulty with old fistula site.  She was seen in our office yesterday with some blistering over this.  This erupted and she is seeing [place] today for further evaluation Current Outpatient Medications  Medication Sig Dispense Refill   acetaminophen (TYLENOL) 500 MG tablet Take 1,000 mg by mouth every 6 (six) hours as needed for moderate pain or headache.     albuterol (PROVENTIL) (2.5 MG/3ML) 0.083% nebulizer solution Take 3 mLs (2.5 mg total) by nebulization every 6 (six) hours as needed for wheezing or shortness of breath. 75 mL 12   amiodarone (PACERONE) 200 MG tablet Take 1 tablet (200 mg total) by mouth daily. 30 tablet 3   apixaban (ELIQUIS) 5 MG TABS tablet Take 1 tablet (5 mg total) by mouth 2 (two) times daily. 180 tablet 3   aspirin EC 81 MG tablet Take 1 tablet (81 mg total) by mouth daily. Swallow whole. 30 tablet 12   atenolol (TENORMIN) 100 MG tablet Take 0.5 tablets (50 mg total) by mouth at bedtime. 90 tablet 3   budesonide-formoterol (SYMBICORT) 160-4.5 MCG/ACT inhaler Inhale 2 puffs into the lungs 2 (two) times daily. (Patient taking differently: Inhale 2 puffs into the lungs 2 (two) times daily as needed (shortness of breath).) 3 each 4   calcium acetate (PHOSLO) 667 MG capsule Take 667-1,334 mg by mouth See admin instructions. Take 1334 mg with meals 3 times daily and 667 mg with snacks     Camphor-Menthol-Methyl Sal (SALONPAS) 3.12-09-08 % PTCH Place 1 patch onto the skin daily as needed (pain).     Carboxymethylcellul-Glycerin (LUBRICATING EYE DROPS OP)  Place 1 drop into both eyes daily as needed (dry eyes).     cetirizine (ZYRTEC) 10 MG tablet Take 1 tablet (10 mg total) by mouth daily as needed for allergies. (Patient taking differently: Take 10 mg by mouth daily.) 90 tablet 1   Continuous Blood Gluc Receiver (FREESTYLE LIBRE 2 READER) DEVI USE TO TEST BLOOD SUGAR CONTINUOUSLY 1 each 2   Continuous Blood Gluc Sensor (FREESTYLE LIBRE 2 SENSOR) MISC Use multiple times daily to track glucose to prevent highs and lows as a complication of dialysis Dx:Z99.2, E11.59 2 each 12   diltiazem (CARDIZEM CD) 240 MG 24 hr capsule Take 1 capsule (240 mg total) by mouth daily. 90 capsule 3   DULoxetine (CYMBALTA) 30 MG capsule TAKE 1 CAPSULE BY MOUTH ONCE DAILY WITH SUPPER 30 capsule 0   Ferrous Fumarate (HEMOCYTE - 106 MG FE) 324 (106 Fe) MG TABS tablet Take 1 tablet (106 mg of iron total) by mouth daily. 30 tablet 11   gabapentin (NEURONTIN) 400 MG capsule TAKE 2 CAPSULES AFTER EACH DIALYSIS SESSION (Patient taking differently: Take 800 mg by mouth See admin instructions. Take 800 mg by mouth Tuesday, Thursday and Saturday after dialysis) 30 capsule 1   glucose blood (ONETOUCH ULTRA) test strip Use to test blood sugar 3 times daily as directed. DX: E11.9 300 each 12   insulin aspart (NOVOLOG FLEXPEN) 100 UNIT/ML FlexPen Inject 1 Units into the skin 3 (three) times daily with meals. <  150: 0 units. 150-199:1 unit. 200-249: 2 units. 250-299: 3 units. 300-349: 4 units. Over 350: 5 units 15 mL 11   insulin degludec (TRESIBA FLEXTOUCH) 100 UNIT/ML FlexTouch Pen Inject 20 Units into the skin at bedtime. Via novo nordisk patient assistance program (Patient taking differently: Inject 30 Units into the skin at bedtime. Via novo nordisk patient assistance program)     lidocaine (LIDODERM) 5 % Place 1 patch onto the skin daily. Remove & Discard patch within 12 hours or as directed by MD 30 patch 0   multivitamin (RENA-VIT) TABS tablet Take 1 tablet by mouth daily.      nitroGLYCERIN (NITROSTAT) 0.4 MG SL tablet Place 1 tablet (0.4 mg total) under the tongue every 5 (five) minutes as needed for chest pain. 25 tablet 11   ondansetron (ZOFRAN) 4 MG tablet TAKE 1 TABLET BY MOUTH EVERY 8 HOURS AS NEEDED FOR NAUSEA AND VOMITING 20 tablet 5   oxyCODONE-acetaminophen (PERCOCET) 5-325 MG tablet Take 1 tablet by mouth every 6 (six) hours as needed for severe pain. 8 tablet 0   polyethylene glycol (MIRALAX / GLYCOLAX) 17 g packet Take 17 g by mouth daily as needed for moderate constipation or mild constipation. 14 each 0   rOPINIRole (REQUIP) 1 MG tablet Take 1 mg by mouth at bedtime.     rosuvastatin (CRESTOR) 5 MG tablet Take 1 tablet (5 mg total) by mouth daily. For cholesterol 90 tablet 2   Semaglutide,0.25 or 0.5MG /DOS, (OZEMPIC, 0.25 OR 0.5 MG/DOSE,) 2 MG/1.5ML SOPN Inject 0.5 mg into the skin every Friday.     torsemide (DEMADEX) 20 MG tablet Take 40 mg by mouth daily.     pantoprazole (PROTONIX) 40 MG tablet Take 1 tablet (40 mg total) by mouth 2 (two) times daily. 120 tablet 0   rOPINIRole (REQUIP) 0.5 MG tablet Take 1 tablet (0.5 mg total) by mouth at bedtime. 30 tablet 0   No current facility-administered medications for this visit.     PHYSICAL EXAM: Vitals:   09/06/22 1034  BP: 126/66  Pulse: 70  Temp: 98.2 F (36.8 C)  SpO2: 94%  Weight: 179 lb 12.8 oz (81.6 kg)  Height: 5\' 3"  (1.6 m)    GENERAL: The patient is a well-nourished female, in no acute distress. The vital signs are documented above. Excellent thrill in her upper arm AV graft with excellent healing of her antecubital and axillary incision.  She does have old hematoma in the degenerated AV fistula and has an area of erosion of the skin in the upper portion of this.  MEDICAL ISSUES: No evidence of infection.  She is continue to have good use of her tunneled hemodialysis catheter.  I explained that she will continue to have old hematoma blood leak from this area.  Also explained that she  may require exploration and evacuation of this hematoma.  I will see her again in 1 week for continued close follow-up and can plan evacuation if needed.   Rosetta Posner, MD FACS [facility] [phone]  Note: Portions of this report may have been transcribed using voice recognition software.  Every effort has been made to ensure accuracy; however, inadvertent computerized transcription errors may still be present.

## 2022-09-06 NOTE — Patient Instructions (Signed)
Increase Amiodarone to 200mg  twice a day  until follow up

## 2022-09-06 NOTE — Progress Notes (Signed)
Patient returns for ECG after decreasing amiodarone. 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 She was in SR at her last visit, appears paroxysmal. Will increase her amiodarone to 200 mg BID for now to let her load longer. F/u with Roderic Palau in two weeks.

## 2022-09-07 DIAGNOSIS — N186 End stage renal disease: Secondary | ICD-10-CM | POA: Diagnosis not present

## 2022-09-07 DIAGNOSIS — D509 Iron deficiency anemia, unspecified: Secondary | ICD-10-CM | POA: Diagnosis not present

## 2022-09-07 DIAGNOSIS — N250 Renal osteodystrophy: Secondary | ICD-10-CM | POA: Diagnosis not present

## 2022-09-07 DIAGNOSIS — N2581 Secondary hyperparathyroidism of renal origin: Secondary | ICD-10-CM | POA: Diagnosis not present

## 2022-09-07 DIAGNOSIS — Z23 Encounter for immunization: Secondary | ICD-10-CM | POA: Diagnosis not present

## 2022-09-07 DIAGNOSIS — T82848A Pain from vascular prosthetic devices, implants and grafts, initial encounter: Secondary | ICD-10-CM | POA: Diagnosis not present

## 2022-09-07 DIAGNOSIS — Z992 Dependence on renal dialysis: Secondary | ICD-10-CM | POA: Diagnosis not present

## 2022-09-07 DIAGNOSIS — T82898A Other specified complication of vascular prosthetic devices, implants and grafts, initial encounter: Secondary | ICD-10-CM | POA: Diagnosis not present

## 2022-09-09 DIAGNOSIS — Z23 Encounter for immunization: Secondary | ICD-10-CM | POA: Diagnosis not present

## 2022-09-09 DIAGNOSIS — N250 Renal osteodystrophy: Secondary | ICD-10-CM | POA: Diagnosis not present

## 2022-09-09 DIAGNOSIS — N2581 Secondary hyperparathyroidism of renal origin: Secondary | ICD-10-CM | POA: Diagnosis not present

## 2022-09-09 DIAGNOSIS — T82848A Pain from vascular prosthetic devices, implants and grafts, initial encounter: Secondary | ICD-10-CM | POA: Diagnosis not present

## 2022-09-09 DIAGNOSIS — N186 End stage renal disease: Secondary | ICD-10-CM | POA: Diagnosis not present

## 2022-09-09 DIAGNOSIS — T82898A Other specified complication of vascular prosthetic devices, implants and grafts, initial encounter: Secondary | ICD-10-CM | POA: Diagnosis not present

## 2022-09-09 DIAGNOSIS — D509 Iron deficiency anemia, unspecified: Secondary | ICD-10-CM | POA: Diagnosis not present

## 2022-09-09 DIAGNOSIS — Z992 Dependence on renal dialysis: Secondary | ICD-10-CM | POA: Diagnosis not present

## 2022-09-12 DIAGNOSIS — N250 Renal osteodystrophy: Secondary | ICD-10-CM | POA: Diagnosis not present

## 2022-09-12 DIAGNOSIS — N2581 Secondary hyperparathyroidism of renal origin: Secondary | ICD-10-CM | POA: Diagnosis not present

## 2022-09-12 DIAGNOSIS — T82898A Other specified complication of vascular prosthetic devices, implants and grafts, initial encounter: Secondary | ICD-10-CM | POA: Diagnosis not present

## 2022-09-12 DIAGNOSIS — N186 End stage renal disease: Secondary | ICD-10-CM | POA: Diagnosis not present

## 2022-09-12 DIAGNOSIS — D509 Iron deficiency anemia, unspecified: Secondary | ICD-10-CM | POA: Diagnosis not present

## 2022-09-12 DIAGNOSIS — Z23 Encounter for immunization: Secondary | ICD-10-CM | POA: Diagnosis not present

## 2022-09-12 DIAGNOSIS — T82848A Pain from vascular prosthetic devices, implants and grafts, initial encounter: Secondary | ICD-10-CM | POA: Diagnosis not present

## 2022-09-12 DIAGNOSIS — Z992 Dependence on renal dialysis: Secondary | ICD-10-CM | POA: Diagnosis not present

## 2022-09-13 ENCOUNTER — Ambulatory Visit (INDEPENDENT_AMBULATORY_CARE_PROVIDER_SITE_OTHER): Payer: Medicare Other | Admitting: Vascular Surgery

## 2022-09-13 ENCOUNTER — Encounter: Payer: Medicare Other | Admitting: Vascular Surgery

## 2022-09-13 ENCOUNTER — Encounter: Payer: Self-pay | Admitting: Vascular Surgery

## 2022-09-13 VITALS — BP 134/68 | HR 83 | Temp 98.4°F | Ht 63.0 in | Wt 179.0 lb

## 2022-09-13 DIAGNOSIS — Z992 Dependence on renal dialysis: Secondary | ICD-10-CM

## 2022-09-13 DIAGNOSIS — N186 End stage renal disease: Secondary | ICD-10-CM

## 2022-09-13 NOTE — Progress Notes (Signed)
[facility]  Patient name: [name] MRN: [id number] DOB: [birth year] Sex: female  REASON FOR VISIT: Continued follow-up right arm AV access  HPI: [name] is a 71 y.o. female here for continued follow-up of right arm AV access.  I saw her last week.  She has a very nice functioning of her new right upper arm AV Gore-Tex graft.  She did have erosion of the skin of the nonfunctional AV fistula and is seen today for [name] follow-up to make sure that this is resolving.  He is using her tunneled catheter without difficulty  She continues to complain of tenderness in both lower extremities.  She reports "knots" on her legs that are tender  Current Outpatient Medications  Medication Sig Dispense Refill   acetaminophen (TYLENOL) 500 MG tablet Take 1,000 mg by mouth every 6 (six) hours as needed for moderate pain or headache.     albuterol (PROVENTIL) (2.5 MG/3ML) 0.083% nebulizer solution Take 3 mLs (2.5 mg total) by nebulization every 6 (six) hours as needed for wheezing or shortness of breath. 75 mL 12   amiodarone (PACERONE) 200 MG tablet Take 1 tablet by mouth twice a day for 2 weeks then reduce to 1 tablet daily 60 tablet 0   apixaban (ELIQUIS) 5 MG TABS tablet Take 1 tablet (5 mg total) by mouth 2 (two) times daily. 180 tablet 3   aspirin EC 81 MG tablet Take 1 tablet (81 mg total) by mouth daily. Swallow whole. 30 tablet 12   atenolol (TENORMIN) 100 MG tablet Take 0.5 tablets (50 mg total) by mouth at bedtime. 90 tablet 3   budesonide-formoterol (SYMBICORT) 160-4.5 MCG/ACT inhaler Inhale 2 puffs into the lungs 2 (two) times daily. (Patient taking differently: Inhale 2 puffs into the lungs 2 (two) times daily as needed (shortness of breath).) 3 each 4   calcium acetate (PHOSLO) 667 MG capsule Take 667-1,334 mg by mouth See admin instructions. Take 1334 mg with meals 3 times daily and 667 mg with snacks      Camphor-Menthol-Methyl Sal (SALONPAS) 3.12-09-08 % PTCH Place 1 patch onto the skin daily as needed (pain).     Carboxymethylcellul-Glycerin (LUBRICATING EYE DROPS OP) Place 1 drop into both eyes daily as needed (dry eyes).     cetirizine (ZYRTEC) 10 MG tablet Take 1 tablet (10 mg total) by mouth daily as needed for allergies. (Patient taking differently: Take 10 mg by mouth daily.) 90 tablet 1   Continuous Blood Gluc Receiver (FREESTYLE LIBRE 2 READER) DEVI USE TO TEST BLOOD SUGAR CONTINUOUSLY 1 each 2   Continuous Blood Gluc Sensor (FREESTYLE LIBRE 2 SENSOR) MISC Use multiple times daily to track glucose to prevent highs and lows as a complication of dialysis Dx:Z99.2, E11.59 2 each 12   diltiazem (CARDIZEM CD) 240 MG 24 hr capsule Take 1 capsule (240 mg total) by mouth daily. 90 capsule 3   DULoxetine (CYMBALTA) 30 MG capsule TAKE 1 CAPSULE BY MOUTH ONCE DAILY WITH SUPPER 30 capsule 0   Ferrous Fumarate (HEMOCYTE - 106 MG FE) 324 (106 Fe) MG TABS tablet Take 1 tablet (106 mg of iron total) by mouth daily. 30 tablet 11   gabapentin (NEURONTIN) 400 MG capsule TAKE 2 CAPSULES AFTER EACH DIALYSIS SESSION (Patient taking differently: Take 800 mg by mouth See admin instructions. Take 800 mg by mouth Tuesday, Thursday and Saturday after dialysis) 30 capsule 1   glucose blood (ONETOUCH ULTRA) test strip Use to test blood  sugar 3 times daily as directed. DX: E11.9 300 each 12   insulin aspart (NOVOLOG FLEXPEN) 100 UNIT/ML FlexPen Inject 1 Units into the skin 3 (three) times daily with meals. < 150: 0 units. 150-199:1 unit. 200-249: 2 units. 250-299: 3 units. 300-349: 4 units. Over 350: 5 units 15 mL 11   insulin degludec (TRESIBA FLEXTOUCH) 100 UNIT/ML FlexTouch Pen Inject 20 Units into the skin at bedtime. Via novo nordisk patient assistance program (Patient taking differently: Inject 30 Units into the skin at bedtime. Via novo nordisk patient assistance program)     lidocaine (LIDODERM) 5 % Place 1 patch onto  the skin daily. Remove & Discard patch within 12 hours or as directed by MD 30 patch 0   multivitamin (RENA-VIT) TABS tablet Take 1 tablet by mouth daily.     nitroGLYCERIN (NITROSTAT) 0.4 MG SL tablet Place 1 tablet (0.4 mg total) under the tongue every 5 (five) minutes as needed for chest pain. 25 tablet 11   ondansetron (ZOFRAN) 4 MG tablet TAKE 1 TABLET BY MOUTH EVERY 8 HOURS AS NEEDED FOR NAUSEA AND VOMITING 20 tablet 5   oxyCODONE-acetaminophen (PERCOCET) 5-325 MG tablet Take 1 tablet by mouth every 6 (six) hours as needed for severe pain. 8 tablet 0   polyethylene glycol (MIRALAX / GLYCOLAX) 17 g packet Take 17 g by mouth daily as needed for moderate constipation or mild constipation. 14 each 0   rOPINIRole (REQUIP) 1 MG tablet Take 1 mg by mouth at bedtime.     rosuvastatin (CRESTOR) 5 MG tablet Take 1 tablet (5 mg total) by mouth daily. For cholesterol 90 tablet 2   Semaglutide,0.25 or 0.5MG /DOS, (OZEMPIC, 0.25 OR 0.5 MG/DOSE,) 2 MG/1.5ML SOPN Inject 0.5 mg into the skin every Friday.     torsemide (DEMADEX) 20 MG tablet Take 40 mg by mouth daily.     pantoprazole (PROTONIX) 40 MG tablet Take 1 tablet (40 mg total) by mouth 2 (two) times daily. 120 tablet 0   rOPINIRole (REQUIP) 0.5 MG tablet Take 1 tablet (0.5 mg total) by mouth at bedtime. 30 tablet 0   No current facility-administered medications for this visit.     PHYSICAL EXAM: Vitals:   09/13/22 1022  BP: 134/68  Pulse: 83  Temp: 98.4 F (36.9 C)  SpO2: 96%  Weight: 179 lb (81.2 kg)  Height: 5\' 3"  (1.6 m)    GENERAL: The patient is a well-nourished female, in no acute distress. The vital signs are documented above. Excellent thrill in her AV graft.  She is having nice resolution of the hematoma and her fistula.  No erythema.  I listened to her pedal signals with hand-held Doppler.  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  MEDICAL ISSUES: Regarding her AV access, I feel that she is resolving nicely.  I would recommend allowing complete healing of her wound before use of the AV graft if possible.  If she has failure of her tunneled catheter, I would feel safe to use her AV graft at any time.  Regarding her lower extremity discomfort, this does not appear to be arterial rest pain.  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  Every effort has been made to ensure accuracy; however, inadvertent computerized transcription errors may still be present.

## 2022-09-14 DIAGNOSIS — T82848A Pain from vascular prosthetic devices, implants and grafts, initial encounter: Secondary | ICD-10-CM | POA: Diagnosis not present

## 2022-09-14 DIAGNOSIS — N2581 Secondary hyperparathyroidism of renal origin: Secondary | ICD-10-CM | POA: Diagnosis not present

## 2022-09-14 DIAGNOSIS — Z23 Encounter for immunization: Secondary | ICD-10-CM | POA: Diagnosis not present

## 2022-09-14 DIAGNOSIS — N250 Renal osteodystrophy: Secondary | ICD-10-CM | POA: Diagnosis not present

## 2022-09-14 DIAGNOSIS — Z992 Dependence on renal dialysis: Secondary | ICD-10-CM | POA: Diagnosis not present

## 2022-09-14 DIAGNOSIS — T82898A Other specified complication of vascular prosthetic devices, implants and grafts, initial encounter: Secondary | ICD-10-CM | POA: Diagnosis not present

## 2022-09-14 DIAGNOSIS — D509 Iron deficiency anemia, unspecified: Secondary | ICD-10-CM | POA: Diagnosis not present

## 2022-09-14 DIAGNOSIS — N186 End stage renal disease: Secondary | ICD-10-CM | POA: Diagnosis not present

## 2022-09-16 DIAGNOSIS — D509 Iron deficiency anemia, unspecified: Secondary | ICD-10-CM | POA: Diagnosis not present

## 2022-09-16 DIAGNOSIS — Z23 Encounter for immunization: Secondary | ICD-10-CM | POA: Diagnosis not present

## 2022-09-16 DIAGNOSIS — T82848A Pain from vascular prosthetic devices, implants and grafts, initial encounter: Secondary | ICD-10-CM | POA: Diagnosis not present

## 2022-09-16 DIAGNOSIS — N250 Renal osteodystrophy: Secondary | ICD-10-CM | POA: Diagnosis not present

## 2022-09-16 DIAGNOSIS — N2581 Secondary hyperparathyroidism of renal origin: Secondary | ICD-10-CM | POA: Diagnosis not present

## 2022-09-16 DIAGNOSIS — T82898A Other specified complication of vascular prosthetic devices, implants and grafts, initial encounter: Secondary | ICD-10-CM | POA: Diagnosis not present

## 2022-09-16 DIAGNOSIS — N186 End stage renal disease: Secondary | ICD-10-CM | POA: Diagnosis not present

## 2022-09-16 DIAGNOSIS — Z992 Dependence on renal dialysis: Secondary | ICD-10-CM | POA: Diagnosis not present

## 2022-09-18 ENCOUNTER — Ambulatory Visit (HOSPITAL_COMMUNITY)
Admission: RE | Admit: 2022-09-18 | Discharge: 2022-09-18 | Disposition: A | Discharge status: Home / Self Care | Payer: Medicare Other | Source: Ambulatory Visit | Attending: Nurse Practitioner | Admitting: Nurse Practitioner

## 2022-09-18 ENCOUNTER — Inpatient Hospital Stay (HOSPITAL_COMMUNITY)
Admission: RE | Admit: 2022-09-18 | Discharge: 2022-09-18 | Disposition: A | Discharge status: Home / Self Care | Payer: Medicare Other | Source: Ambulatory Visit | Attending: Nurse Practitioner | Admitting: Nurse Practitioner

## 2022-09-18 VITALS — BP 164/56 | HR 75 | Ht 63.0 in | Wt 179.6 lb

## 2022-09-18 DIAGNOSIS — I484 Atypical atrial flutter: Secondary | ICD-10-CM

## 2022-09-18 DIAGNOSIS — I2510 Atherosclerotic heart disease of native coronary artery without angina pectoris: Secondary | ICD-10-CM | POA: Diagnosis not present

## 2022-09-18 DIAGNOSIS — I4819 Other persistent atrial fibrillation: Secondary | ICD-10-CM | POA: Diagnosis not present

## 2022-09-18 DIAGNOSIS — I480 Paroxysmal atrial fibrillation: Secondary | ICD-10-CM

## 2022-09-18 DIAGNOSIS — N186 End stage renal disease: Secondary | ICD-10-CM | POA: Diagnosis not present

## 2022-09-18 DIAGNOSIS — J449 Chronic obstructive pulmonary disease, unspecified: Secondary | ICD-10-CM | POA: Diagnosis not present

## 2022-09-18 DIAGNOSIS — I4892 Unspecified atrial flutter: Secondary | ICD-10-CM | POA: Diagnosis not present

## 2022-09-18 DIAGNOSIS — Z992 Dependence on renal dialysis: Secondary | ICD-10-CM | POA: Insufficient documentation

## 2022-09-18 DIAGNOSIS — I5032 Chronic diastolic (congestive) heart failure: Secondary | ICD-10-CM | POA: Insufficient documentation

## 2022-09-18 DIAGNOSIS — D6869 Other thrombophilia: Secondary | ICD-10-CM

## 2022-09-18 NOTE — Progress Notes (Signed)
Primary Care Physician: [name], MD Referring Physician: Dr. Celedonio Miyamoto is a 71 y.o. female with a h/o  diastolic heart failure, coronary artery disease, end-stage renal disease on dialysis, COPD, insulin-dependent diabetes, CVA.  She also has persistent atrial fibrillation.  She feels weak and fatigued due to her atrial fibrillation.  She had a cardioversion 04/24/2022 and felt improved after her cardioversion, but unfortunately quickly went back into atrial fibrillation.    April 2023, she had an echo that showed a normal ejection fraction with elevated right atrial pressure and severe right ventricular dysfunction.  She had a left heart catheterization that showed RCA and LAD disease and had coronary atherectomy performed.  She represented to the hospital with GI bleeding.  She has had capsule endoscopy and was started on Protonix  She was seen in consult with Dr. Curt Bears 7/24. He wanted to start amiodarone as he felt that  she was not an optimal ablation candidate. However, she was on Seroquel and this needed to be stopped prior to start of amiodarone.  She is now off Seroquel for several weeks. It was suggested that she could use Cymbalta but she has not contacted PCP to start. She states she feels ok off Seroquel. I discussed risk vrs benefit of amiodarone and she is now willing to start. She has also been taking some hydroxyzine ans is aware that she will  not be able to use this going forward with amio. Qtc today 462 ms.  F/u in afib clinic, 08/23/22. She continues to  load on amiodarone 200 mg bid with reduction to one a day 09/03/22. She is in SR today. She did have her revision on her shunt 9/12 and saw Dr. Donnetta Hutching this am for continued swelling rt arm. For now, it will be watched. She has not seen in any improvement in the way she feels in SR but has noted at dialysis that her HR's have been more stable. She has now started on Cymbalta.   F/u 09/18/22. She had f/u with  ekg 10/4 and was found back in atrial flutter. Her amiodarone was increased back to 200 mg bid and she is now back for 2 week f/u of increase amio to assess rhythm. Ekg shows either atrial flutter with variable block vrs afib, rate controlled. She is unaware if she is paroxysmal or persistent. I will place a one week zio patch to confirm and then let pt know of plans. For now amiodarone 200 mg bid .   Today, she denies symptoms of palpitations, chest pain, shortness of breath, orthopnea, PND, lower extremity edema, dizziness, presyncope, syncope, or neurologic sequela. The patient is tolerating medications without difficulties and is otherwise without complaint today.   Past Medical History:  Diagnosis Date   Acute on chronic diastolic CHF (congestive heart failure) ([place]) 03/05/2015   Grade 2. EF 60-65%   Anemia    hx of   Anxiety    Arthritis    CAD (coronary artery disease)    s/p Stenting in 2005;  [place] 7/09: Vigorous LV function, 2-3+ MR, RI 40%, RI stent patent, proximal-mid RCA 40-50%;  Echo 7/09:  EF 55-65%, trivial MR;  Myoview 11/18/12: EF 66%, normal LV wall motion, mild to moderate anterior ischemia - reviewed by [facility] and felt to rep breast atten (low risk);  Echo 6/14: mild LVH, EF 55-65%, Gr 2 DD, PASP 44, trivial eff    Chronic kidney disease    CREATININE IS UP--GOING  TO KIDNEY MD    Chronic neck pain    COPD (chronic obstructive pulmonary disease) (HCC)    Degenerative joint disease    left shoulder   Diabetic neuropathy (HCC)    Diverticulosis    Esophageal stricture    GERD (gastroesophageal reflux disease)    Hyperlipidemia    Hypertension    IDDM (insulin dependent diabetes mellitus)    Pericardial effusion 03/06/2015   Very small per echo ; pleural nodules seen on CT chest.   Peripheral vascular disease ([place])    Stroke ([place])    "mini- years ago"   Vitamin D deficiency    Past Surgical History:  Procedure Laterality Date   A/V FISTULAGRAM N/A 08/10/2017   Procedure:  A/V Fistulagram - Right Arm;  Surgeon: Elam Dutch, MD;  Location: [place] [facility];  Service: Cardiovascular;  Laterality: N/A;   A/V FISTULAGRAM N/A 03/03/2020   Procedure: A/V FISTULAGRAM - Right Arm;  Surgeon: Marty Heck, MD;  Location: [place] [facility];  Service: Cardiovascular;  Laterality: N/A;   ABDOMINAL HYSTERECTOMY     AV FISTULA PLACEMENT Right 07/20/2015   Procedure: RADIAL CEPHALIC ARTERIOVENOUS FISTULA CREATION RIGHT ARM;  Surgeon: Angelia Mould, MD;  Location: [place];  Service: Vascular;  Laterality: Right;   AV FISTULA PLACEMENT Right 11/26/2015   Procedure:  Creation of RIGHT BRACHIO-CEPHALIC arteriovenous fistula;  Surgeon: Angelia Mould, MD;  Location: [place];  Service: Vascular;  Laterality: Right;   AV FISTULA PLACEMENT Right 08/15/2022   Procedure: INSERTION OF RIGHT UPPER ARM ARTERIOVENOUS (AV) GORE-TEX GRAFT;  Surgeon: Rosetta Posner, MD;  Location: [facility];  Service: Vascular;  Laterality: Right;   BACK SURGERY     2007 & 2013   [place]  2003, 2005, 2009   1 stent   cardiac stents     CARDIOVERSION N/A 04/24/2022   Procedure: CARDIOVERSION;  Surgeon: Fay Records, MD;  Location: [facility];  Service: Cardiovascular;  Laterality: N/A;   CATARACT EXTRACTION W/PHACO Right 05/04/2014   Procedure: CATARACT EXTRACTION PHACO AND INTRAOCULAR LENS PLACEMENT ([place]);  Surgeon: Williams Che, MD;  Location: [facility];  Service: Ophthalmology;  Laterality: Right;  CDE 1.47   CATARACT EXTRACTION W/PHACO Left 07/20/2014   Procedure: CATARACT EXTRACTION WITH PHACO AND INTRAOCULAR LENS PLACEMENT LEFT EYE CDE=1.79;  Surgeon: Williams Che, MD;  Location: [facility];  Service: Ophthalmology;  Laterality: Left;   CERVICAL SPINE SURGERY  2012   8 screws   CORONARY ATHERECTOMY N/A 03/08/2022   Procedure: CORONARY ATHERECTOMY;  Surgeon: Sherren Mocha, MD;  Location: [place] [facility];  Service: Cardiovascular;   Laterality: N/A;   CYSTOSCOPY WITH INJECTION N/A 06/03/2013   Procedure: MACROPLASTIQUE  INJECTION ;  Surgeon: Malka So, MD;  Location: [facility];  Service: Urology;  Laterality: N/A;   ESOPHAGEAL DILATION Right 06/02/2022   Procedure: ESOPHAGEAL DILATION;  Surgeon: Harvel Quale, MD;  [facility];  Service: Gastroenterology;  Laterality: Right;   ESOPHAGOGASTRODUODENOSCOPY (EGD) WITH PROPOFOL N/A 06/02/2022   Procedure: ESOPHAGOGASTRODUODENOSCOPY (EGD) WITH PROPOFOL;  Surgeon: Harvel Quale, MD;  [facility];  Service: Gastroenterology;  Laterality: N/A;  TO BE DONE IN OR 2   FISTULOGRAM Right 10/20/2016   Procedure: FISTULOGRAM WITH POSSIBLE INTERVENTION;  Surgeon: [name], MD;  [facility]: [facility];  Service: Vascular;  Laterality: Right;   GIVENS CAPSULE STUDY N/A 06/11/2022  Procedure: GIVENS CAPSULE STUDY;  Surgeon: Clarene Essex, MD;  Location: [place];  Service: Gastroenterology;  Laterality: N/A;   HEMOSTASIS CLIP PLACEMENT  06/02/2022   Procedure: HEMOSTASIS CLIP PLACEMENT;  Surgeon: Harvel Quale, MD;  [facility];  Service: Gastroenterology;;   HOT HEMOSTASIS  06/02/2022   Procedure: HOT HEMOSTASIS (ARGON PLASMA COAGULATION/BICAP);  Surgeon: Montez Morita, Quillian Quince, MD;  [facility];  Service: Gastroenterology;;   INSERTION OF DIALYSIS CATHETER N/A 11/26/2015   Procedure: INSERTION OF DIALYSIS CATHETER;  Surgeon: Angelia Mould, MD;  Location: [place];  Service: Vascular;  Laterality: N/A;   INSERTION OF DIALYSIS CATHETER Right 08/15/2022   Procedure: INSERTION OF TUNNELED DIALYSIS CATHETER;  Surgeon: Rosetta Posner, MD;  [facility];  Service: Vascular;  Laterality: Right;   INTRAVASCULAR IMAGING/OCT N/A 03/08/2022   Procedure: INTRAVASCULAR IMAGING/OCT;  Surgeon: Sherren Mocha, MD;  Location: [place] [facility];  Service: Cardiovascular;  Laterality: N/A;   LAMINECTOMY  12/29/2011   LEFT  HEART CATH AND CORONARY ANGIOGRAPHY N/A 03/06/2022   Procedure: LEFT HEART CATH AND CORONARY ANGIOGRAPHY;  Surgeon: Nelva Bush, MD;  Location: [place] [facility];  Service: Cardiovascular;  Laterality: N/A;   LUMBAR LAMINECTOMY/DECOMPRESSION MICRODISCECTOMY  12/29/2011   Procedure: LUMBAR LAMINECTOMY/DECOMPRESSION MICRODISCECTOMY;  Surgeon: Floyce Stakes, MD;  Location: [place] [facility];  Service: Neurosurgery;  Laterality: N/A;  Lumbar Two through Lumbar Five Laminectomies/Cellsaver   PARTIAL HYSTERECTOMY     PERIPHERAL VASCULAR BALLOON ANGIOPLASTY  03/03/2020   Procedure: PERIPHERAL VASCULAR BALLOON ANGIOPLASTY;  Surgeon: Marty Heck, MD;  Location: [place] [facility];  Service: Cardiovascular;;  Rt arm fistula   PERIPHERAL VASCULAR BALLOON ANGIOPLASTY Right 04/15/2021   Procedure: PERIPHERAL VASCULAR BALLOON ANGIOPLASTY;  Surgeon: Angelia Mould, MD;  Location: [place] [facility];  Service: Cardiovascular;  Laterality: Right;  arm fistula   PERIPHERAL VASCULAR BALLOON ANGIOPLASTY  03/14/2022   Procedure: PERIPHERAL VASCULAR BALLOON ANGIOPLASTY;  Surgeon: Broadus John, MD;  Location: [place] [facility];  Service: Cardiovascular;;  right arm AVF   PERIPHERAL VASCULAR CATHETERIZATION N/A 11/22/2015   Procedure: Fistulagram;  Surgeon: Angelia Mould, MD;  Location: [place] [facility];  Service: Cardiovascular;  Laterality: N/A;   PERIPHERAL VASCULAR INTERVENTION  08/10/2017   Procedure: PERIPHERAL VASCULAR INTERVENTION;  Surgeon: Elam Dutch, MD;  Location: [place] [facility];  Service: Cardiovascular;;   SHOULDER SURGERY Right    Rotator cuff    Current Outpatient Medications  Medication Sig Dispense Refill   acetaminophen (TYLENOL) 500 MG tablet Take 1,000 mg by mouth every 6 (six) hours as needed for moderate pain or headache.     albuterol (PROVENTIL) (2.5 MG/3ML) 0.083% nebulizer solution Take 3 mLs (2.5 mg total) by nebulization every 6 (six) hours as needed  for wheezing or shortness of breath. 75 mL 12   amiodarone (PACERONE) 200 MG tablet Take 1 tablet by mouth twice a day for 2 weeks then reduce to 1 tablet daily 60 tablet 0   apixaban (ELIQUIS) 5 MG TABS tablet Take 1 tablet (5 mg total) by mouth 2 (two) times daily. 180 tablet 3   aspirin EC 81 MG tablet Take 1 tablet (81 mg total) by mouth daily. Swallow whole. 30 tablet 12   atenolol (TENORMIN) 100 MG tablet Take 0.5 tablets (50 mg total) by mouth at bedtime. 90 tablet 3   budesonide-formoterol (SYMBICORT) 160-4.5 MCG/ACT inhaler Inhale 2 puffs into the lungs 2 (two) times daily. (Patient taking  differently: Inhale 2 puffs into the lungs 2 (two) times daily as needed (shortness of breath).) 3 each 4   calcium acetate (PHOSLO) 667 MG capsule Take 667-1,334 mg by mouth See admin instructions. Take 1334 mg with meals 3 times daily and 667 mg with snacks     Camphor-Menthol-Methyl Sal (SALONPAS) 3.12-09-08 % PTCH Place 1 patch onto the skin daily as needed (pain).     Carboxymethylcellul-Glycerin (LUBRICATING EYE DROPS OP) Place 1 drop into both eyes daily as needed (dry eyes).     cetirizine (ZYRTEC) 10 MG tablet Take 1 tablet (10 mg total) by mouth daily as needed for allergies. (Patient taking differently: Take 10 mg by mouth daily.) 90 tablet 1   Continuous Blood Gluc Receiver (FREESTYLE LIBRE 2 READER) DEVI USE TO TEST BLOOD SUGAR CONTINUOUSLY 1 each 2   Continuous Blood Gluc Sensor (FREESTYLE LIBRE 2 SENSOR) MISC Use multiple times daily to track glucose to prevent highs and lows as a complication of dialysis Dx:Z99.2, E11.59 2 each 12   diltiazem (CARDIZEM CD) 240 MG 24 hr capsule Take 1 capsule (240 mg total) by mouth daily. 90 capsule 3   DULoxetine (CYMBALTA) 30 MG capsule TAKE 1 CAPSULE BY MOUTH ONCE DAILY WITH SUPPER 30 capsule 0   Ferrous Fumarate (HEMOCYTE - 106 MG FE) 324 (106 Fe) MG TABS tablet Take 1 tablet (106 mg of iron total) by mouth daily. 30 tablet 11   gabapentin (NEURONTIN) 400  MG capsule TAKE 2 CAPSULES AFTER EACH DIALYSIS SESSION (Patient taking differently: Take 800 mg by mouth See admin instructions. Take 800 mg by mouth Tuesday, Thursday and Saturday after dialysis) 30 capsule 1   glucose blood (ONETOUCH ULTRA) test strip Use to test blood sugar 3 times daily as directed. DX: E11.9 300 each 12   insulin aspart (NOVOLOG FLEXPEN) 100 UNIT/ML FlexPen Inject 1 Units into the skin 3 (three) times daily with meals. < 150: 0 units. 150-199:1 unit. 200-249: 2 units. 250-299: 3 units. 300-349: 4 units. Over 350: 5 units 15 mL 11   insulin degludec (TRESIBA FLEXTOUCH) 100 UNIT/ML FlexTouch Pen Inject 20 Units into the skin at bedtime. Via novo nordisk patient assistance program (Patient taking differently: Inject 30 Units into the skin at bedtime. Via novo nordisk patient assistance program)     lidocaine (LIDODERM) 5 % Place 1 patch onto the skin daily. Remove & Discard patch within 12 hours or as directed by MD 30 patch 0   multivitamin (RENA-VIT) TABS tablet Take 1 tablet by mouth daily.     nitroGLYCERIN (NITROSTAT) 0.4 MG SL tablet Place 1 tablet (0.4 mg total) under the tongue every 5 (five) minutes as needed for chest pain. 25 tablet 11   ondansetron (ZOFRAN) 4 MG tablet TAKE 1 TABLET BY MOUTH EVERY 8 HOURS AS NEEDED FOR NAUSEA AND VOMITING 20 tablet 5   oxyCODONE-acetaminophen (PERCOCET) 5-325 MG tablet Take 1 tablet by mouth every 6 (six) hours as needed for severe pain. 8 tablet 0   pantoprazole (PROTONIX) 40 MG tablet Take 1 tablet (40 mg total) by mouth 2 (two) times daily. 120 tablet 0   polyethylene glycol (MIRALAX / GLYCOLAX) 17 g packet Take 17 g by mouth daily as needed for moderate constipation or mild constipation. 14 each 0   rOPINIRole (REQUIP) 0.5 MG tablet Take 1 tablet (0.5 mg total) by mouth at bedtime. 30 tablet 0   rOPINIRole (REQUIP) 1 MG tablet Take 1 mg by mouth at bedtime.  rosuvastatin (CRESTOR) 5 MG tablet Take 1 tablet (5 mg total) by mouth  daily. For cholesterol 90 tablet 2   Semaglutide,0.25 or 0.5MG /DOS, (OZEMPIC, 0.25 OR 0.5 MG/DOSE,) 2 MG/1.5ML SOPN Inject 0.5 mg into the skin every Friday.     torsemide (DEMADEX) 20 MG tablet Take 40 mg by mouth daily.     No current facility-administered medications for this encounter.    Allergies  Allergen Reactions   Azithromycin     Prolonged QT   Ace Inhibitors Cough   Clopidogrel Bisulfate Other (See Comments)    Sick, Headache, "Felt terrible"   Codeine Other (See Comments)    hallucinations   Lisinopril Cough   Penicillins Other (See Comments)    Unknown childhood reaction Did it involve swelling of the face/tongue/throat, SOB, or low BP? Unknown Did it involve sudden or severe rash/hives, skin peeling, or any reaction on the inside of your mouth or nose? Unknown Did you need to seek medical attention at a hospital or doctor's office? Unknown When did it last happen?      childhood allergy If all above answers are "NO", may proceed with cephalosporin use.      Social History   Socioeconomic History   Marital status: Widowed    Spouse name: Not on file   Number of children: 2   Years of education: GED   Highest education level: GED or equivalent  Occupational History   Occupation: Disabled  Tobacco Use   Smoking status: Former    Packs/day: 1.00    Years: 30.00    Total pack years: 30.00    Types: Cigarettes    Quit date: 12/04/2001    Years since quitting: 20.8    Passive exposure: Never   Smokeless tobacco: Never  Vaping Use   Vaping Use: Never used  Substance and Sexual Activity   Alcohol use: No    Alcohol/week: 6.0 standard drinks of alcohol    Types: 6 Cans of beer per week   Drug use: No   Sexual activity: Not Currently    Birth control/protection: Surgical  Other Topics Concern   Not on file  Social History Narrative   Lives in [place] with mother   Daughters live nearby   Social Determinants of Health   Financial Resource Strain:  [place]  (07/11/2022)   Overall Financial Resource Strain (CARDIA)    Difficulty of Paying Living Expenses: Not hard at all  Food Insecurity: No Food Insecurity (07/11/2022)   Hunger Vital Sign    Worried About Running Out of Food in the Last Year: Never true    [place] in the Last Year: Never true  Transportation Needs: No Transportation Needs (07/11/2022)   PRAPARE - Hydrologist (Medical): No    Lack of Transportation (Non-Medical): No  Physical Activity: Inactive (12/16/2021)   Exercise Vital Sign    Days of Exercise per Week: 0 days    Minutes of Exercise per Session: 0 min  Stress: No Stress Concern Present (07/11/2022)   [place]    Feeling of Stress : Only a little  Social Connections: Moderately Isolated (07/11/2022)   Social Connection and Isolation Panel [NHANES]    Frequency of Communication with Friends and Family: More than three times a week    Frequency of Social Gatherings with Friends and Family: More than three times a week    Attends Religious Services: More than 4  times per year    Active Member of Clubs or Organizations: No    Attends Archivist Meetings: Never    Marital Status: Widowed  Intimate Partner Violence: Not At Risk (07/11/2022)   Humiliation, Afraid, Rape, and Kick questionnaire    Fear of Current or Ex-Partner: No    Emotionally Abused: No    Physically Abused: No    Sexually Abused: No    Family History  Problem Relation Age of Onset   Heart disease Other    Hypertension Father    Cancer Father    Hypertension Mother    Hypertension Daughter    Pancreatitis Brother    Hypertension Daughter    Diabetes Daughter     ROS- All systems are reviewed and negative except as per the HPI above  Physical Exam: Vitals:   09/18/22 1325  Height: 5\' 3"  (1.6 m)   Wt Readings from Last 3 Encounters:  09/13/22 81.2 kg  09/06/22 81.6 kg   09/05/22 80.2 kg    Labs: Lab Results  Component Value Date   NA 137 08/15/2022   K 3.6 08/15/2022   CL 93 (L) 08/15/2022   CO2 31 08/15/2022   GLUCOSE 171 (H) 08/15/2022   BUN 29 (H) 08/15/2022   CREATININE 7.04 (H) 08/15/2022   CALCIUM 9.2 08/15/2022   PHOS 3.0 06/15/2022   MG 2.0 06/10/2022   Lab Results  Component Value Date   INR 1.9 (H) 03/26/2022   Lab Results  Component Value Date   CHOL 127 06/28/2022   HDL 42 06/28/2022   LDLCALC 66 06/28/2022   TRIG 100 06/28/2022     GEN- The patient is well appearing, alert and oriented x 3 today.   Head- normocephalic, atraumatic Eyes-  Sclera clear, conjunctiva pink Ears- hearing intact Oropharynx- clear Neck- supple, no JVP Lymph- no cervical lymphadenopathy Lungs- Clear to ausculation bilaterally, normal work of breathing Heart- Regular rate and rhythm, no murmurs, rubs or gallops, PMI not laterally displaced GI- soft, NT, ND, + BS Extremities- no clubbing, cyanosis, or+ edema rt arm MS- no significant deformity or atrophy Skin- no rash or lesion Psych- euthymic mood, full affect Neuro- strength and sensation are intact   EKG-Vent. rate 75 BPM PR interval 192 ms QRS duration 96 ms QT/QTcB 454/506 ms P-R-T axes 61 165 3 Sinus rhythm with Premature supraventricular complexes Premature ventricular complexes or Fusion ( I feel this is atypical flutter with variable block or afib rate controlled ) Septal infarct , age undetermined Lateral infarct , age undetermined Abnormal ECG When compared with ECG of 06-Sep-2022 11:49, PREVIOUS ECG IS PRESENT   Assessment and Plan:  1. Afib/flutter  She continues  on amiodarone 200 mg bid  and is  not in SR today I will place a one  week zio monitor and if persistent, I will plan on a cardioversion, if paroxsymal, I  will let her load a while longer on 200 mg bid  She is aware of no benadryl, no hydroxyzine and no Seroquel She is now on Cymbalta Continue eliquis 5 mg  bid   2. Revision of shunt rt arm Rt arm healing nicely  Checked by Dr. Donnetta Hutching 10/11 She is using her tunneled catheter without difficulty for her dialysis  I will calll her with plans when  I get her monitor results    Butch Penny C. Niam Nepomuceno, [facility] [address] [place], [place] [address] [phone]

## 2022-09-19 DIAGNOSIS — T82848A Pain from vascular prosthetic devices, implants and grafts, initial encounter: Secondary | ICD-10-CM | POA: Diagnosis not present

## 2022-09-19 DIAGNOSIS — Z992 Dependence on renal dialysis: Secondary | ICD-10-CM | POA: Diagnosis not present

## 2022-09-19 DIAGNOSIS — T82898A Other specified complication of vascular prosthetic devices, implants and grafts, initial encounter: Secondary | ICD-10-CM | POA: Diagnosis not present

## 2022-09-19 DIAGNOSIS — N186 End stage renal disease: Secondary | ICD-10-CM | POA: Diagnosis not present

## 2022-09-19 DIAGNOSIS — Z23 Encounter for immunization: Secondary | ICD-10-CM | POA: Diagnosis not present

## 2022-09-19 DIAGNOSIS — N2581 Secondary hyperparathyroidism of renal origin: Secondary | ICD-10-CM | POA: Diagnosis not present

## 2022-09-19 DIAGNOSIS — D509 Iron deficiency anemia, unspecified: Secondary | ICD-10-CM | POA: Diagnosis not present

## 2022-09-19 DIAGNOSIS — N250 Renal osteodystrophy: Secondary | ICD-10-CM | POA: Diagnosis not present

## 2022-09-21 DIAGNOSIS — N186 End stage renal disease: Secondary | ICD-10-CM | POA: Diagnosis not present

## 2022-09-21 DIAGNOSIS — Z992 Dependence on renal dialysis: Secondary | ICD-10-CM | POA: Diagnosis not present

## 2022-09-21 DIAGNOSIS — D509 Iron deficiency anemia, unspecified: Secondary | ICD-10-CM | POA: Diagnosis not present

## 2022-09-21 DIAGNOSIS — N250 Renal osteodystrophy: Secondary | ICD-10-CM | POA: Diagnosis not present

## 2022-09-21 DIAGNOSIS — N2581 Secondary hyperparathyroidism of renal origin: Secondary | ICD-10-CM | POA: Diagnosis not present

## 2022-09-21 DIAGNOSIS — Z23 Encounter for immunization: Secondary | ICD-10-CM | POA: Diagnosis not present

## 2022-09-21 DIAGNOSIS — T82848A Pain from vascular prosthetic devices, implants and grafts, initial encounter: Secondary | ICD-10-CM | POA: Diagnosis not present

## 2022-09-21 DIAGNOSIS — T82898A Other specified complication of vascular prosthetic devices, implants and grafts, initial encounter: Secondary | ICD-10-CM | POA: Diagnosis not present

## 2022-09-23 DIAGNOSIS — N2581 Secondary hyperparathyroidism of renal origin: Secondary | ICD-10-CM | POA: Diagnosis not present

## 2022-09-23 DIAGNOSIS — T82848A Pain from vascular prosthetic devices, implants and grafts, initial encounter: Secondary | ICD-10-CM | POA: Diagnosis not present

## 2022-09-23 DIAGNOSIS — N186 End stage renal disease: Secondary | ICD-10-CM | POA: Diagnosis not present

## 2022-09-23 DIAGNOSIS — T82898A Other specified complication of vascular prosthetic devices, implants and grafts, initial encounter: Secondary | ICD-10-CM | POA: Diagnosis not present

## 2022-09-23 DIAGNOSIS — D509 Iron deficiency anemia, unspecified: Secondary | ICD-10-CM | POA: Diagnosis not present

## 2022-09-23 DIAGNOSIS — Z23 Encounter for immunization: Secondary | ICD-10-CM | POA: Diagnosis not present

## 2022-09-23 DIAGNOSIS — N250 Renal osteodystrophy: Secondary | ICD-10-CM | POA: Diagnosis not present

## 2022-09-23 DIAGNOSIS — Z992 Dependence on renal dialysis: Secondary | ICD-10-CM | POA: Diagnosis not present

## 2022-09-26 ENCOUNTER — Telehealth: Payer: Self-pay

## 2022-09-26 DIAGNOSIS — N2581 Secondary hyperparathyroidism of renal origin: Secondary | ICD-10-CM | POA: Diagnosis not present

## 2022-09-26 DIAGNOSIS — Z992 Dependence on renal dialysis: Secondary | ICD-10-CM | POA: Diagnosis not present

## 2022-09-26 DIAGNOSIS — T82898A Other specified complication of vascular prosthetic devices, implants and grafts, initial encounter: Secondary | ICD-10-CM | POA: Diagnosis not present

## 2022-09-26 DIAGNOSIS — Z23 Encounter for immunization: Secondary | ICD-10-CM | POA: Diagnosis not present

## 2022-09-26 DIAGNOSIS — N250 Renal osteodystrophy: Secondary | ICD-10-CM | POA: Diagnosis not present

## 2022-09-26 DIAGNOSIS — N186 End stage renal disease: Secondary | ICD-10-CM | POA: Diagnosis not present

## 2022-09-26 DIAGNOSIS — T82848A Pain from vascular prosthetic devices, implants and grafts, initial encounter: Secondary | ICD-10-CM | POA: Diagnosis not present

## 2022-09-26 DIAGNOSIS — D509 Iron deficiency anemia, unspecified: Secondary | ICD-10-CM | POA: Diagnosis not present

## 2022-09-26 NOTE — Telephone Encounter (Signed)
Bonney Leitz, NP with [facility] in [place] called stating that the pt had an infection.  Reviewed pt's chart, returned call for clarification, two identifiers used. She states that the hematoma has now open up with a pinpoint hole. There is significant pus and bloody drainage, induration, and erythema. Pt has been changing the bandage frequently and didn't even mention it until she asked her how her arm was doing. The pt has no other symptoms and is afebrile. She collected blood cultures and ordered 1 gram of Vanc and 1 gram of Fortez to be given with HD treatment. Asked for advice for pt regarding treatment or need for appt.  Spoke with [place], [place] who advised that the pt go to [facility] ED after HD. Bernadette Hoit, NP who informed the pt.  Dr Virl Cagey came into office, [facility] [place] spoke with him and he advised for pt to make appt to be seen in office to assess the site before going to ED. Bernadette Hoit, NP and relayed new advice. Appt scheduled for 10/25. Confirmed understanding.

## 2022-09-27 ENCOUNTER — Ambulatory Visit (INDEPENDENT_AMBULATORY_CARE_PROVIDER_SITE_OTHER): Payer: Medicare Other | Admitting: Physician Assistant

## 2022-09-27 VITALS — BP 160/74 | HR 82 | Temp 97.8°F | Resp 20 | Ht 63.0 in | Wt 176.1 lb

## 2022-09-27 DIAGNOSIS — N186 End stage renal disease: Secondary | ICD-10-CM

## 2022-09-27 DIAGNOSIS — Z992 Dependence on renal dialysis: Secondary | ICD-10-CM

## 2022-09-27 NOTE — Progress Notes (Signed)
POST OPERATIVE OFFICE NOTE    CC:  F/u for surgery  HPI:  This is a 71 y.o. female who is s/p right arm AV graft insertion by Dr. Donnetta Hutching on 08/15/2022.  She has been seen by Dr. Donnetta Hutching as recently as 09/13/2022 for evaluation of right arm graft as well as for a resolving hematoma from previous right arm fistula.  She has ongoing oozing from area of prior hematoma.  She believes that her skin is very thin due to the amount of tape that she is using in order to keep a bandage on this area.  She denies any fevers, chills, nausea/vomiting.  She continues to use her [facility] for dialysis.  They have not yet accessed the right arm AV graft for dialysis.  She denies any symptoms of steal syndrome in her right hand.  Allergies  Allergen Reactions   Azithromycin     Prolonged QT   Ace Inhibitors Cough   Clopidogrel Bisulfate Other (See Comments)    Sick, Headache, "Felt terrible"   Codeine Other (See Comments)    hallucinations   Lisinopril Cough   Penicillins Other (See Comments)    Unknown childhood reaction Did it involve swelling of the face/tongue/throat, SOB, or low BP? Unknown Did it involve sudden or severe rash/hives, skin peeling, or any reaction on the inside of your mouth or nose? Unknown Did you need to seek medical attention at a hospital or doctor's office? Unknown When did it last happen?      childhood allergy If all above answers are "NO", may proceed with cephalosporin use.      Current Outpatient Medications  Medication Sig Dispense Refill   acetaminophen (TYLENOL) 500 MG tablet Take 1,000 mg by mouth every 6 (six) hours as needed for moderate pain or headache.     albuterol (PROVENTIL) (2.5 MG/3ML) 0.083% nebulizer solution Take 3 mLs (2.5 mg total) by nebulization every 6 (six) hours as needed for wheezing or shortness of breath. 75 mL 12   amiodarone (PACERONE) 200 MG tablet Take 1 tablet by mouth twice a day for 2 weeks then reduce to 1 tablet daily (Patient taking  differently: Take 200 mg by mouth 2 (two) times daily. Take 1 tablet by mouth twice a day for 2 weeks then reduce to 1 tablet daily) 60 tablet 0   apixaban (ELIQUIS) 5 MG TABS tablet Take 1 tablet (5 mg total) by mouth 2 (two) times daily. 180 tablet 3   aspirin EC 81 MG tablet Take 1 tablet (81 mg total) by mouth daily. Swallow whole. 30 tablet 12   atenolol (TENORMIN) 100 MG tablet Take 0.5 tablets (50 mg total) by mouth at bedtime. 90 tablet 3   budesonide-formoterol (SYMBICORT) 160-4.5 MCG/ACT inhaler Inhale 2 puffs into the lungs 2 (two) times daily. (Patient taking differently: Inhale 2 puffs into the lungs 2 (two) times daily as needed (shortness of breath).) 3 each 4   Camphor-Menthol-Methyl Sal (SALONPAS) 3.12-09-08 % PTCH Place 1 patch onto the skin daily as needed (pain).     Carboxymethylcellul-Glycerin (LUBRICATING EYE DROPS OP) Place 1 drop into both eyes daily as needed (dry eyes).     cetirizine (ZYRTEC) 10 MG tablet Take 1 tablet (10 mg total) by mouth daily as needed for allergies. (Patient taking differently: Take 10 mg by mouth daily.) 90 tablet 1   Continuous Blood Gluc Receiver (FREESTYLE LIBRE 2 READER) DEVI USE TO TEST BLOOD SUGAR CONTINUOUSLY 1 each 2   Continuous Blood Gluc Sensor (FREESTYLE  LIBRE 2 SENSOR) MISC Use multiple times daily to track glucose to prevent highs and lows as a complication of dialysis Dx:Z99.2, E11.59 2 each 12   diltiazem (CARDIZEM CD) 240 MG 24 hr capsule Take 1 capsule (240 mg total) by mouth daily. 90 capsule 3   DULoxetine (CYMBALTA) 30 MG capsule TAKE 1 CAPSULE BY MOUTH ONCE DAILY WITH SUPPER 30 capsule 0   Ferrous Fumarate (HEMOCYTE - 106 MG FE) 324 (106 Fe) MG TABS tablet Take 1 tablet (106 mg of iron total) by mouth daily. 30 tablet 11   gabapentin (NEURONTIN) 400 MG capsule TAKE 2 CAPSULES AFTER EACH DIALYSIS SESSION (Patient taking differently: Take 800 mg by mouth See admin instructions. Take 800 mg by mouth Tuesday, Thursday and Saturday after  dialysis) 30 capsule 1   glucose blood (ONETOUCH ULTRA) test strip Use to test blood sugar 3 times daily as directed. DX: E11.9 300 each 12   insulin aspart (NOVOLOG FLEXPEN) 100 UNIT/ML FlexPen Inject 1 Units into the skin 3 (three) times daily with meals. < 150: 0 units. 150-199:1 unit. 200-249: 2 units. 250-299: 3 units. 300-349: 4 units. Over 350: 5 units 15 mL 11   insulin degludec (TRESIBA FLEXTOUCH) 100 UNIT/ML FlexTouch Pen Inject 20 Units into the skin at bedtime. Via novo nordisk patient assistance program (Patient taking differently: Inject 25 Units into the skin at bedtime. Via novo nordisk patient assistance program)     lanthanum (FOSRENOL) 750 MG chewable tablet SMARTSIG:2 Tablet(s) By Mouth     multivitamin (RENA-VIT) TABS tablet Take 1 tablet by mouth daily.     nitroGLYCERIN (NITROSTAT) 0.4 MG SL tablet Place 1 tablet (0.4 mg total) under the tongue every 5 (five) minutes as needed for chest pain. 25 tablet 11   ondansetron (ZOFRAN) 4 MG tablet TAKE 1 TABLET BY MOUTH EVERY 8 HOURS AS NEEDED FOR NAUSEA AND VOMITING 20 tablet 5   polyethylene glycol (MIRALAX / GLYCOLAX) 17 g packet Take 17 g by mouth daily as needed for moderate constipation or mild constipation. 14 each 0   rOPINIRole (REQUIP) 1 MG tablet Take 1 mg by mouth at bedtime.     rosuvastatin (CRESTOR) 5 MG tablet Take 1 tablet (5 mg total) by mouth daily. For cholesterol 90 tablet 2   Semaglutide,0.25 or 0.5MG /DOS, (OZEMPIC, 0.25 OR 0.5 MG/DOSE,) 2 MG/1.5ML SOPN Inject 0.5 mg into the skin every Friday.     sevelamer carbonate (RENVELA) 800 MG tablet Taking 800mg  by mouth with each meal and 1 tablet with snacks.     torsemide (DEMADEX) 20 MG tablet Take 20 mg by mouth daily.     No current facility-administered medications for this visit.     ROS:  See HPI  Physical Exam:  Vitals:   09/27/22 1117  BP: (!) 160/74  Pulse: 82  Resp: 20  Temp: 97.8 F (36.6 C)  TempSrc: Temporal  SpO2: 95%  Weight: 176 lb 1.6 oz  (79.9 kg)  Height: 5\' 3"  (1.6 m)    Incision: Right axilla and AC fossa incision well-healed Extremities: Area of concern is from a previous hematoma related to her prior AV fistula; no bleeding after silver nitrate application; palpable thrill through right arm AV graft without any areas of fluctuance; palpable right radial pulse Neuro: A&O  Assessment/Plan:  This is a 71 y.o. female who is s/p: Right arm AV graft; ongoing drainage from prior hematoma related to prior AV fistula  -Patent right arm AV graft with palpable thrill throughout and  no signs or symptoms of steal syndrome in the right hand -She has a small pinhole area that is draining serosanguineous fluid however does not seem to communicate with the tunneled AV graft.  This area was cauterized with silver nitrate.  She was observed for the next 10 to 15 minutes without any ongoing oozing.  A nonadhesive bandage was placed and she was wrapped loosely with a Kling wrap to avoid tape. -Patient would like to keep her scheduled appointment next week with Dr. Donnetta Hutching.  Hopefully this area will be healed by that time and she will be able to begin using her right arm AV graft for dialysis   Dagoberto Ligas, PA-C [facility] [phone]  Clinic MD:  Donzetta Matters

## 2022-09-28 DIAGNOSIS — Z23 Encounter for immunization: Secondary | ICD-10-CM | POA: Diagnosis not present

## 2022-09-28 DIAGNOSIS — N186 End stage renal disease: Secondary | ICD-10-CM | POA: Diagnosis not present

## 2022-09-28 DIAGNOSIS — T82898A Other specified complication of vascular prosthetic devices, implants and grafts, initial encounter: Secondary | ICD-10-CM | POA: Diagnosis not present

## 2022-09-28 DIAGNOSIS — D509 Iron deficiency anemia, unspecified: Secondary | ICD-10-CM | POA: Diagnosis not present

## 2022-09-28 DIAGNOSIS — N2581 Secondary hyperparathyroidism of renal origin: Secondary | ICD-10-CM | POA: Diagnosis not present

## 2022-09-28 DIAGNOSIS — Z992 Dependence on renal dialysis: Secondary | ICD-10-CM | POA: Diagnosis not present

## 2022-09-28 DIAGNOSIS — T82848A Pain from vascular prosthetic devices, implants and grafts, initial encounter: Secondary | ICD-10-CM | POA: Diagnosis not present

## 2022-09-28 DIAGNOSIS — N250 Renal osteodystrophy: Secondary | ICD-10-CM | POA: Diagnosis not present

## 2022-09-29 ENCOUNTER — Inpatient Hospital Stay (HOSPITAL_COMMUNITY)
Admission: EM | Admit: 2022-09-29 | Discharge: 2022-10-04 | DRG: 291 | Disposition: A | Discharge status: Home / Self Care | Payer: Medicare Other | Attending: Internal Medicine | Admitting: Internal Medicine

## 2022-09-29 ENCOUNTER — Emergency Department (HOSPITAL_COMMUNITY): Payer: Medicare Other

## 2022-09-29 ENCOUNTER — Encounter (HOSPITAL_COMMUNITY): Payer: Self-pay | Admitting: *Deleted

## 2022-09-29 ENCOUNTER — Other Ambulatory Visit: Payer: Self-pay

## 2022-09-29 DIAGNOSIS — R9431 Abnormal electrocardiogram [ECG] [EKG]: Secondary | ICD-10-CM | POA: Diagnosis present

## 2022-09-29 DIAGNOSIS — Z992 Dependence on renal dialysis: Secondary | ICD-10-CM

## 2022-09-29 DIAGNOSIS — Z9842 Cataract extraction status, left eye: Secondary | ICD-10-CM

## 2022-09-29 DIAGNOSIS — J811 Chronic pulmonary edema: Secondary | ICD-10-CM | POA: Diagnosis not present

## 2022-09-29 DIAGNOSIS — Z9841 Cataract extraction status, right eye: Secondary | ICD-10-CM

## 2022-09-29 DIAGNOSIS — I480 Paroxysmal atrial fibrillation: Secondary | ICD-10-CM | POA: Diagnosis not present

## 2022-09-29 DIAGNOSIS — I132 Hypertensive heart and chronic kidney disease with heart failure and with stage 5 chronic kidney disease, or end stage renal disease: Principal | ICD-10-CM | POA: Diagnosis present

## 2022-09-29 DIAGNOSIS — K219 Gastro-esophageal reflux disease without esophagitis: Secondary | ICD-10-CM | POA: Diagnosis present

## 2022-09-29 DIAGNOSIS — M7989 Other specified soft tissue disorders: Secondary | ICD-10-CM | POA: Diagnosis not present

## 2022-09-29 DIAGNOSIS — Z7901 Long term (current) use of anticoagulants: Secondary | ICD-10-CM

## 2022-09-29 DIAGNOSIS — Y832 Surgical operation with anastomosis, bypass or graft as the cause of abnormal reaction of the patient, or of later complication, without mention of misadventure at the time of the procedure: Secondary | ICD-10-CM | POA: Diagnosis present

## 2022-09-29 DIAGNOSIS — I25118 Atherosclerotic heart disease of native coronary artery with other forms of angina pectoris: Secondary | ICD-10-CM | POA: Diagnosis present

## 2022-09-29 DIAGNOSIS — D631 Anemia in chronic kidney disease: Secondary | ICD-10-CM | POA: Diagnosis not present

## 2022-09-29 DIAGNOSIS — D72829 Elevated white blood cell count, unspecified: Secondary | ICD-10-CM | POA: Diagnosis present

## 2022-09-29 DIAGNOSIS — J9601 Acute respiratory failure with hypoxia: Secondary | ICD-10-CM | POA: Diagnosis present

## 2022-09-29 DIAGNOSIS — R0602 Shortness of breath: Secondary | ICD-10-CM | POA: Diagnosis not present

## 2022-09-29 DIAGNOSIS — Z961 Presence of intraocular lens: Secondary | ICD-10-CM | POA: Diagnosis present

## 2022-09-29 DIAGNOSIS — Z90711 Acquired absence of uterus with remaining cervical stump: Secondary | ICD-10-CM

## 2022-09-29 DIAGNOSIS — K5909 Other constipation: Secondary | ICD-10-CM | POA: Diagnosis not present

## 2022-09-29 DIAGNOSIS — Z885 Allergy status to narcotic agent status: Secondary | ICD-10-CM

## 2022-09-29 DIAGNOSIS — E1169 Type 2 diabetes mellitus with other specified complication: Secondary | ICD-10-CM | POA: Diagnosis present

## 2022-09-29 DIAGNOSIS — Z888 Allergy status to other drugs, medicaments and biological substances status: Secondary | ICD-10-CM

## 2022-09-29 DIAGNOSIS — F419 Anxiety disorder, unspecified: Secondary | ICD-10-CM | POA: Diagnosis not present

## 2022-09-29 DIAGNOSIS — R0789 Other chest pain: Secondary | ICD-10-CM | POA: Diagnosis not present

## 2022-09-29 DIAGNOSIS — N186 End stage renal disease: Secondary | ICD-10-CM | POA: Diagnosis present

## 2022-09-29 DIAGNOSIS — T8579XA Infection and inflammatory reaction due to other internal prosthetic devices, implants and grafts, initial encounter: Secondary | ICD-10-CM | POA: Diagnosis present

## 2022-09-29 DIAGNOSIS — I509 Heart failure, unspecified: Principal | ICD-10-CM

## 2022-09-29 DIAGNOSIS — N250 Renal osteodystrophy: Secondary | ICD-10-CM | POA: Diagnosis not present

## 2022-09-29 DIAGNOSIS — J441 Chronic obstructive pulmonary disease with (acute) exacerbation: Secondary | ICD-10-CM | POA: Diagnosis not present

## 2022-09-29 DIAGNOSIS — Z881 Allergy status to other antibiotic agents status: Secondary | ICD-10-CM

## 2022-09-29 DIAGNOSIS — I5033 Acute on chronic diastolic (congestive) heart failure: Secondary | ICD-10-CM | POA: Diagnosis present

## 2022-09-29 DIAGNOSIS — T380X5A Adverse effect of glucocorticoids and synthetic analogues, initial encounter: Secondary | ICD-10-CM | POA: Diagnosis not present

## 2022-09-29 DIAGNOSIS — Z87891 Personal history of nicotine dependence: Secondary | ICD-10-CM

## 2022-09-29 DIAGNOSIS — D638 Anemia in other chronic diseases classified elsewhere: Secondary | ICD-10-CM | POA: Diagnosis not present

## 2022-09-29 DIAGNOSIS — E1140 Type 2 diabetes mellitus with diabetic neuropathy, unspecified: Secondary | ICD-10-CM | POA: Diagnosis not present

## 2022-09-29 DIAGNOSIS — Z833 Family history of diabetes mellitus: Secondary | ICD-10-CM

## 2022-09-29 DIAGNOSIS — R319 Hematuria, unspecified: Secondary | ICD-10-CM | POA: Diagnosis present

## 2022-09-29 DIAGNOSIS — Z90710 Acquired absence of both cervix and uterus: Secondary | ICD-10-CM

## 2022-09-29 DIAGNOSIS — I272 Pulmonary hypertension, unspecified: Secondary | ICD-10-CM | POA: Diagnosis present

## 2022-09-29 DIAGNOSIS — Z1152 Encounter for screening for COVID-19: Secondary | ICD-10-CM

## 2022-09-29 DIAGNOSIS — E8770 Fluid overload, unspecified: Secondary | ICD-10-CM | POA: Diagnosis present

## 2022-09-29 DIAGNOSIS — Z8249 Family history of ischemic heart disease and other diseases of the circulatory system: Secondary | ICD-10-CM

## 2022-09-29 DIAGNOSIS — E785 Hyperlipidemia, unspecified: Secondary | ICD-10-CM | POA: Diagnosis not present

## 2022-09-29 DIAGNOSIS — Y92239 Unspecified place in hospital as the place of occurrence of the external cause: Secondary | ICD-10-CM | POA: Diagnosis not present

## 2022-09-29 DIAGNOSIS — R079 Chest pain, unspecified: Secondary | ICD-10-CM | POA: Diagnosis not present

## 2022-09-29 DIAGNOSIS — E876 Hypokalemia: Secondary | ICD-10-CM | POA: Diagnosis present

## 2022-09-29 DIAGNOSIS — E669 Obesity, unspecified: Secondary | ICD-10-CM | POA: Diagnosis present

## 2022-09-29 DIAGNOSIS — Z955 Presence of coronary angioplasty implant and graft: Secondary | ICD-10-CM

## 2022-09-29 DIAGNOSIS — E1122 Type 2 diabetes mellitus with diabetic chronic kidney disease: Secondary | ICD-10-CM | POA: Diagnosis present

## 2022-09-29 DIAGNOSIS — E1151 Type 2 diabetes mellitus with diabetic peripheral angiopathy without gangrene: Secondary | ICD-10-CM | POA: Diagnosis present

## 2022-09-29 DIAGNOSIS — Z880 Allergy status to penicillin: Secondary | ICD-10-CM

## 2022-09-29 DIAGNOSIS — J9621 Acute and chronic respiratory failure with hypoxia: Secondary | ICD-10-CM | POA: Diagnosis present

## 2022-09-29 DIAGNOSIS — Z79890 Hormone replacement therapy (postmenopausal): Secondary | ICD-10-CM

## 2022-09-29 DIAGNOSIS — Z7982 Long term (current) use of aspirin: Secondary | ICD-10-CM

## 2022-09-29 DIAGNOSIS — Z6831 Body mass index (BMI) 31.0-31.9, adult: Secondary | ICD-10-CM

## 2022-09-29 DIAGNOSIS — Z794 Long term (current) use of insulin: Secondary | ICD-10-CM

## 2022-09-29 DIAGNOSIS — Z79899 Other long term (current) drug therapy: Secondary | ICD-10-CM

## 2022-09-29 DIAGNOSIS — N132 Hydronephrosis with renal and ureteral calculous obstruction: Secondary | ICD-10-CM | POA: Diagnosis not present

## 2022-09-29 DIAGNOSIS — Z8673 Personal history of transient ischemic attack (TIA), and cerebral infarction without residual deficits: Secondary | ICD-10-CM

## 2022-09-29 DIAGNOSIS — M898X9 Other specified disorders of bone, unspecified site: Secondary | ICD-10-CM | POA: Diagnosis present

## 2022-09-29 LAB — RESP PANEL BY RT-PCR (FLU A&B, COVID) ARPGX2
Influenza A by PCR: NEGATIVE
Influenza B by PCR: NEGATIVE
SARS Coronavirus 2 by RT PCR: NEGATIVE

## 2022-09-29 LAB — CBC
HCT: 36.7 % (ref 36.0–46.0)
Hemoglobin: 11.7 g/dL — ABNORMAL LOW (ref 12.0–15.0)
MCH: 27.9 pg (ref 26.0–34.0)
MCHC: 31.9 g/dL (ref 30.0–36.0)
MCV: 87.6 fL (ref 80.0–100.0)
Platelets: 249 10*3/uL (ref 150–400)
RBC: 4.19 MIL/uL (ref 3.87–5.11)
RDW: 20 % — ABNORMAL HIGH (ref 11.5–15.5)
WBC: 15.6 10*3/uL — ABNORMAL HIGH (ref 4.0–10.5)
nRBC: 0.1 % (ref 0.0–0.2)

## 2022-09-29 LAB — BRAIN NATRIURETIC PEPTIDE: B Natriuretic Peptide: >4500 pg/mL — ABNORMAL HIGH (ref 0.0–100.0)

## 2022-09-29 LAB — BASIC METABOLIC PANEL
Anion gap: 15 (ref 5–15)
BUN: 15 mg/dL (ref 8–23)
CO2: 25 mmol/L (ref 22–32)
Calcium: 9.9 mg/dL (ref 8.9–10.3)
Chloride: 99 mmol/L (ref 98–111)
Creatinine, Ser: 7.35 mg/dL — ABNORMAL HIGH (ref 0.44–1.00)
GFR, Estimated: 6 mL/min — ABNORMAL LOW (ref >60)
Glucose, Bld: 193 mg/dL — ABNORMAL HIGH (ref 70–99)
Potassium: 3.4 mmol/L — ABNORMAL LOW (ref 3.5–5.1)
Sodium: 139 mmol/L (ref 135–145)

## 2022-09-29 LAB — TROPONIN I (HIGH SENSITIVITY)
Troponin I (High Sensitivity): 60 ng/L — ABNORMAL HIGH (ref <18)
Troponin I (High Sensitivity): 62 ng/L — ABNORMAL HIGH (ref <18)

## 2022-09-29 MED ORDER — ACETAMINOPHEN 650 MG RE SUPP: 650.0000 mg | Freq: Four times a day (QID) | RECTAL | Status: DC | PRN

## 2022-09-29 MED ORDER — DULOXETINE HCL 30 MG PO CPEP
30.0000 mg | ORAL_CAPSULE | Freq: Every evening | ORAL | Status: DC
  Administered 2022-09-30 – 2022-10-03 (×4): 30 mg via ORAL
  Filled 2022-09-29 (×4): qty 1

## 2022-09-29 MED ORDER — ACETAMINOPHEN 325 MG PO TABS
650.0000 mg | ORAL_TABLET | Freq: Four times a day (QID) | ORAL | Status: DC | PRN
  Administered 2022-09-30 – 2022-10-02 (×2): 650 mg via ORAL
  Filled 2022-09-29 (×2): qty 2

## 2022-09-29 MED ORDER — FUROSEMIDE 10 MG/ML IJ SOLN: 80.0000 mg | Freq: Two times a day (BID) | INTRAMUSCULAR | Status: DC

## 2022-09-29 MED ORDER — AMIODARONE HCL 200 MG PO TABS: 200.0000 mg | ORAL_TABLET | Freq: Two times a day (BID) | ORAL | Status: DC

## 2022-09-29 MED ORDER — MOMETASONE FURO-FORMOTEROL FUM 200-5 MCG/ACT IN AERO
2.0000 | INHALATION_SPRAY | Freq: Two times a day (BID) | RESPIRATORY_TRACT | Status: DC
  Administered 2022-09-30: 2 via RESPIRATORY_TRACT
  Filled 2022-09-29: qty 8.8

## 2022-09-29 MED ORDER — INSULIN DETEMIR 100 UNIT/ML ~~LOC~~ SOLN
20.0000 [IU] | Freq: Every day | SUBCUTANEOUS | Status: DC
  Administered 2022-09-30 – 2022-10-03 (×5): 20 [IU] via SUBCUTANEOUS
  Filled 2022-09-29 (×6): qty 0.2

## 2022-09-29 MED ORDER — ROSUVASTATIN CALCIUM 10 MG PO TABS
5.0000 mg | ORAL_TABLET | Freq: Every day | ORAL | Status: DC
  Administered 2022-09-30 – 2022-10-04 (×5): 5 mg via ORAL
  Filled 2022-09-29 (×5): qty 1

## 2022-09-29 MED ORDER — CHLORHEXIDINE GLUCONATE CLOTH 2 % EX PADS
6.0000 | MEDICATED_PAD | Freq: Every day | CUTANEOUS | Status: DC
  Administered 2022-09-30 – 2022-10-03 (×3): 6 via TOPICAL

## 2022-09-29 MED ORDER — PROCHLORPERAZINE EDISYLATE 10 MG/2ML IJ SOLN
10.0000 mg | Freq: Four times a day (QID) | INTRAMUSCULAR | Status: DC | PRN
  Administered 2022-09-30 – 2022-10-02 (×3): 10 mg via INTRAVENOUS
  Filled 2022-09-29 (×4): qty 2

## 2022-09-29 MED ORDER — DILTIAZEM HCL ER COATED BEADS 240 MG PO CP24
240.0000 mg | ORAL_CAPSULE | Freq: Every day | ORAL | Status: DC
  Administered 2022-09-30 – 2022-10-04 (×5): 240 mg via ORAL
  Filled 2022-09-29 (×5): qty 1

## 2022-09-29 MED ORDER — INSULIN ASPART 100 UNIT/ML IJ SOLN
0.0000 [IU] | Freq: Every day | INTRAMUSCULAR | Status: DC
  Administered 2022-09-30: 2 [IU] via SUBCUTANEOUS
  Administered 2022-10-01: 5 [IU] via SUBCUTANEOUS

## 2022-09-29 MED ORDER — APIXABAN 5 MG PO TABS: 5.0000 mg | ORAL_TABLET | Freq: Two times a day (BID) | ORAL | Status: DC

## 2022-09-29 MED ORDER — OXYCODONE HCL 5 MG PO TABS: 5.0000 mg | ORAL_TABLET | ORAL | Status: DC | PRN

## 2022-09-29 MED ORDER — DIPHENHYDRAMINE HCL 25 MG PO CAPS
50.0000 mg | ORAL_CAPSULE | Freq: Once | ORAL | Status: AC
  Administered 2022-09-29: 50 mg via ORAL
  Filled 2022-09-29: qty 2

## 2022-09-29 MED ORDER — ASPIRIN 81 MG PO TBEC
81.0000 mg | DELAYED_RELEASE_TABLET | Freq: Every day | ORAL | Status: DC
  Administered 2022-09-30 – 2022-10-04 (×5): 81 mg via ORAL
  Filled 2022-09-29 (×5): qty 1

## 2022-09-29 MED ORDER — ATENOLOL 25 MG PO TABS
50.0000 mg | ORAL_TABLET | Freq: Every day | ORAL | Status: DC
  Administered 2022-09-30 – 2022-10-03 (×5): 50 mg via ORAL
  Filled 2022-09-29 (×5): qty 2

## 2022-09-29 MED ORDER — INSULIN ASPART 100 UNIT/ML IJ SOLN
0.0000 [IU] | Freq: Three times a day (TID) | INTRAMUSCULAR | Status: DC
  Administered 2022-09-30: 3 [IU] via SUBCUTANEOUS
  Administered 2022-09-30: 2 [IU] via SUBCUTANEOUS
  Administered 2022-10-01: 5 [IU] via SUBCUTANEOUS
  Administered 2022-10-01: 3 [IU] via SUBCUTANEOUS
  Administered 2022-10-02: 11 [IU] via SUBCUTANEOUS

## 2022-09-29 NOTE — ED Provider Notes (Signed)
[facility] EMERGENCY DEPARTMENT Provider Note   CSN: [id number] Arrival date & time: 09/29/22  1654     History  Chief Complaint  Patient presents with   Chest Pain    [name] is a 71 y.o. female with history of type 2 diabetes, hyperlipidemia, CAD, neuropathy, diastolic heart failure (EF 55% as of April 2023), COPD, chronic kidney disease on hemodialysis, hypertension, paroxysmal atrial fibrillation on Eliquis presents the emergency department complaining of chest pain.  Patient states that pain started yesterday, but is worsened today.  Grabs it as a tightness across her whole chest.  Complaining of associated shortness of breath, nausea, generalized weakness.  She is also complaining of a productive cough.  Last dialysis was yesterday (normally T/R/S) and she finished a full treatment. Is wondering about her hemoglobin levels today as she has history of anemia from GI bleeding. States she has seen blood in her urine but unsure if stool is dark/bloody because she is on iron supplements. Normally wears oxygen only at night.    Chest Pain Associated symptoms: cough, nausea, shortness of breath and weakness   Associated symptoms: no fever        Home Medications Prior to Admission medications   Medication Sig Start Date End Date Taking? Authorizing Provider  acetaminophen (TYLENOL) 500 MG tablet Take 1,000 mg by mouth every 6 (six) hours as needed for moderate pain or headache.    [provider]  albuterol (PROVENTIL) (2.5 MG/3ML) 0.083% nebulizer solution Take 3 mLs (2.5 mg total) by nebulization every 6 (six) hours as needed for wheezing or shortness of breath. 12/02/21   [name], MD  amiodarone (PACERONE) 200 MG tablet Take 1 tablet by mouth twice a day for 2 weeks then reduce to 1 tablet daily Patient taking differently: Take 200 mg by mouth 2 (two) times daily. Take 1 tablet by mouth twice a day for 2 weeks then reduce to 1 tablet daily 09/06/22   Fenton,  Clint R, PA  apixaban (ELIQUIS) 5 MG TABS tablet Take 1 tablet (5 mg total) by mouth 2 (two) times daily. 04/05/22   Minus Breeding, MD  aspirin EC 81 MG tablet Take 1 tablet (81 mg total) by mouth daily. Swallow whole. 06/15/22   Vashti Hey, MD  atenolol (TENORMIN) 100 MG tablet Take 0.5 tablets (50 mg total) by mouth at bedtime. 08/02/22   Sherran Needs, NP  budesonide-formoterol [facility]) 160-4.5 MCG/ACT inhaler Inhale 2 puffs into the lungs 2 (two) times daily. Patient taking differently: Inhale 2 puffs into the lungs 2 (two) times daily as needed (shortness of breath). 05/03/22   [name], MD  Camphor-Menthol-Methyl Sal (SALONPAS) 3.12-09-08 % PTCH Place 1 patch onto the skin daily as needed (pain).    [provider]  Carboxymethylcellul-Glycerin (LUBRICATING EYE DROPS OP) Place 1 drop into both eyes daily as needed (dry eyes).    [provider]  cetirizine (ZYRTEC) 10 MG tablet Take 1 tablet (10 mg total) by mouth daily as needed for allergies. Patient taking differently: Take 10 mg by mouth daily. 06/15/22   Vashti Hey, MD  Continuous Blood Gluc Receiver (FREESTYLE LIBRE 2 READER) DEVI USE TO TEST BLOOD SUGAR CONTINUOUSLY 11/15/21   [name], MD  Continuous Blood Gluc Sensor (FREESTYLE LIBRE 2 SENSOR) MISC Use multiple times daily to track glucose to prevent highs and lows as a complication of dialysis Dx:Z99.2, E11.59 09/15/21   [name], MD  diltiazem (CARDIZEM CD) 240 MG 24  hr capsule Take 1 capsule (240 mg total) by mouth daily. 05/17/22   Minus Breeding, MD  DULoxetine (CYMBALTA) 30 MG capsule TAKE 1 CAPSULE BY MOUTH ONCE DAILY WITH SUPPER 09/04/22   [name], MD  Ferrous Fumarate (HEMOCYTE - 106 MG FE) 324 (106 Fe) MG TABS tablet Take 1 tablet (106 mg of iron total) by mouth daily. 06/28/22   [name], MD  gabapentin (NEURONTIN) 400 MG capsule TAKE 2 CAPSULES AFTER EACH DIALYSIS SESSION Patient taking  differently: Take 800 mg by mouth See admin instructions. Take 800 mg by mouth Tuesday, Thursday and Saturday after dialysis 07/27/22   [name], MD  glucose blood (ONETOUCH ULTRA) test strip Use to test blood sugar 3 times daily as directed. DX: E11.9 09/13/20   [name], MD  insulin aspart (NOVOLOG FLEXPEN) 100 UNIT/ML FlexPen Inject 1 Units into the skin 3 (three) times daily with meals. < 150: 0 units. 150-199:1 unit. 200-249: 2 units. 250-299: 3 units. 300-349: 4 units. Over 350: 5 units 05/19/22   Rakes, Connye Burkitt, FNP  insulin degludec (TRESIBA FLEXTOUCH) 100 UNIT/ML FlexTouch Pen Inject 20 Units into the skin at bedtime. Via novo nordisk patient assistance program Patient taking differently: Inject 25 Units into the skin at bedtime. Via novo nordisk patient assistance program 05/03/22   [name], MD  lanthanum (FOSRENOL) 750 MG chewable tablet SMARTSIG:2 Tablet(s) By Mouth 09/21/22   [provider]  multivitamin (RENA-VIT) TABS tablet Take 1 tablet by mouth daily. 05/13/22   [provider]  nitroGLYCERIN (NITROSTAT) 0.4 MG SL tablet Place 1 tablet (0.4 mg total) under the tongue every 5 (five) minutes as needed for chest pain. 11/09/21   Minus Breeding, MD  ondansetron (ZOFRAN) 4 MG tablet TAKE 1 TABLET BY MOUTH EVERY 8 HOURS AS NEEDED FOR NAUSEA AND VOMITING 07/03/22   [name], MD  polyethylene glycol (MIRALAX / GLYCOLAX) 17 g packet Take 17 g by mouth daily as needed for moderate constipation or mild constipation. 03/10/22   Pokhrel, Corrie Mckusick, MD  rOPINIRole (REQUIP) 1 MG tablet Take 1 mg by mouth at bedtime.    [provider]  rosuvastatin (CRESTOR) 5 MG tablet Take 1 tablet (5 mg total) by mouth daily. For cholesterol 12/12/21   [name], MD  Semaglutide,0.25 or 0.5MG /DOS, (OZEMPIC, 0.25 OR 0.5 MG/DOSE,) 2 MG/1.5ML SOPN Inject 0.5 mg into the skin every Friday.    [provider]  sevelamer carbonate (RENVELA) 800 MG tablet Taking  800mg  by mouth with each meal and 1 tablet with snacks. 09/14/22   [provider]  torsemide (DEMADEX) 20 MG tablet Take 20 mg by mouth daily. 07/02/22   [provider]      Allergies    Azithromycin, Ace inhibitors, Clopidogrel bisulfate, Codeine, Lisinopril, and Penicillins    Review of Systems   Review of Systems  Constitutional:  Negative for chills and fever.  Respiratory:  Positive for cough and shortness of breath.   Cardiovascular:  Positive for chest pain.  Gastrointestinal:  Positive for nausea.  Genitourinary:  Positive for hematuria.  Neurological:  Positive for weakness.  All other systems reviewed and are negative.   Physical Exam Updated Vital Signs BP (!) 167/87 (BP Location: Left Arm)   Pulse 90   Temp 98.1 F (36.7 C)   Resp (!) 22   Ht 5\' 3"  (1.6 m)   Wt 79.9 kg   SpO2 92%   BMI 31.19 kg/m  Physical Exam Vitals and nursing note reviewed.  Constitutional:      Appearance: Normal appearance.  HENT:     Head: Normocephalic and atraumatic.  Eyes:     Conjunctiva/sclera: Conjunctivae normal.  Cardiovascular:     Rate and Rhythm: Normal rate and regular rhythm.  Pulmonary:     Effort: Tachypnea present. No respiratory distress.     Breath sounds: Wheezing present.     Comments: Placed on 2 L nasal cannula for O2 90% on RA. Mild wheezing to auscultation on R lung fields. Breathing through pursed lips Abdominal:     General: There is no distension.     Palpations: Abdomen is soft.     Tenderness: There is no abdominal tenderness.  Skin:    General: Skin is warm and dry.  Neurological:     General: No focal deficit present.     Mental Status: She is alert.     ED Results / Procedures / Treatments   Labs (all labs ordered are listed, but only abnormal results are displayed) Labs Reviewed  BASIC METABOLIC PANEL  CBC  TROPONIN I (HIGH SENSITIVITY)    EKG None  Radiology No results found.  Procedures Procedures     Medications Ordered in ED Medications - No data to display  ED Course/ Medical Decision Making/ A&P                           Medical Decision Making Amount and/or Complexity of Data Reviewed Labs: ordered. Radiology: ordered.  This patient is a 71 y.o. female  who presents to the ED for concern of chest pain starting yesterday.   Differential diagnoses prior to evaluation: The emergent differential diagnosis includes, but is not limited to,  ACS, pericarditis, aortic dissection, PE, pneumothorax, esophageal spasm or rupture, chronic angina, valvular disease, cardiomyopathy, myocarditis, pulmonary HTN, pneumonia, bronchitis, GERD, reflux/PUD, biliary disease, pancreatitis, disk disease, costochondritis, anxiety or panic attack. This is not an exhaustive differential.   Past Medical History / Co-morbidities: type 2 diabetes, hyperlipidemia, CAD, neuropathy, diastolic heart failure (EF 55% as of April 2023), COPD, chronic kidney disease on hemodialysis, hypertension, paroxysmal atrial fibrillation on Eliquis  Physical Exam: Physical exam performed. The pertinent findings include: Afebrile, hypertensive, in rate controlled afib. Some wheezing noted. No peripheral edema. Breathing through pursed lips on room air with saturation of 92%, placed on 2 L nasal cannula.   Lab Tests/Imaging studies: I personally interpreted labs/imaging and the pertinent results include:  Leukocytosis of 15.6, hemoglobin improved compared to prior. BMP, troponin, BNP, respiratory panel, and urinalysis pending at time of shift change.   Chest x-ray with diffuse interstitial opacities and nodular opacity in right lower lobe. I agree with the radiologist interpretation.  Cardiac monitoring: EKG obtained and interpreted by my attending physician which shows: atrial fibrillation with rate in the 80's   Disposition: Patient discussed and care transferred to [facility] PA-C at shift change. Please see his/her note for  further details regarding further ED course and disposition. Plan at time of handoff is await laboratory evaluation to determine disposition. Suspect patient will likely be admitted for new oxygen requirement in the setting of fluid retention as evidenced by pulmonary edema on CXR.   Final Clinical Impression(s) / ED Diagnoses Final diagnoses:  None    Rx / DC Orders ED Discharge Orders     None      Portions of this report may have been transcribed using voice recognition software. Every effort was made to ensure accuracy;  however, inadvertent computerized transcription errors may be present.   Estill Cotta 09/29/22 1820    Davonna Belling, MD 10/02/22 [phone]

## 2022-09-29 NOTE — ED Provider Notes (Signed)
[facility] EMERGENCY DEPARTMENT Provider Note   CSN: [id number] Arrival date & time: 09/29/22  1654     History {Add pertinent medical, surgical, social history, OB history to HPI:1} Chief Complaint  Patient presents with  . Chest Pain   Care transferred from [place], [place]  [name] is a 71 y.o. female.   Chest Pain      Home Medications Prior to Admission medications   Medication Sig Start Date End Date Taking? Authorizing Provider  acetaminophen (TYLENOL) 500 MG tablet Take 1,000 mg by mouth every 6 (six) hours as needed for moderate pain or headache.   Yes [provider]  albuterol (PROVENTIL) (2.5 MG/3ML) 0.083% nebulizer solution Take 3 mLs (2.5 mg total) by nebulization every 6 (six) hours as needed for wheezing or shortness of breath. 12/02/21  Yes [name], MD  amiodarone (PACERONE) 200 MG tablet Take 1 tablet by mouth twice a day for 2 weeks then reduce to 1 tablet daily Patient taking differently: Take 200 mg by mouth 2 (two) times daily. 09/06/22  Yes Fenton, Clint R, PA  apixaban (ELIQUIS) 5 MG TABS tablet Take 1 tablet (5 mg total) by mouth 2 (two) times daily. 04/05/22  Yes Minus Breeding, MD  aspirin EC 81 MG tablet Take 1 tablet (81 mg total) by mouth daily. Swallow whole. 06/15/22  Yes Vashti Hey, MD  atenolol (TENORMIN) 100 MG tablet Take 0.5 tablets (50 mg total) by mouth at bedtime. 08/02/22  Yes Sherran Needs, NP  budesonide-formoterol [facility] Low) 160-4.5 MCG/ACT inhaler Inhale 2 puffs into the lungs 2 (two) times daily. Patient taking differently: Inhale 2 puffs into the lungs 2 (two) times daily as needed (shortness of breath). 05/03/22  Yes [name], [name], MD  Carboxymethylcellul-Glycerin (LUBRICATING EYE DROPS OP) Place 1 drop into both eyes daily as needed (dry eyes).   Yes [provider]  cetirizine (ZYRTEC) 10 MG tablet Take 1 tablet (10 mg total) by mouth daily as needed for allergies. Patient taking  differently: Take 10 mg by mouth daily. 06/15/22  Yes Vashti Hey, MD  diltiazem (CARDIZEM CD) 240 MG 24 hr capsule Take 1 capsule (240 mg total) by mouth daily. 05/17/22  Yes Minus Breeding, MD  DULoxetine (CYMBALTA) 30 MG capsule TAKE 1 CAPSULE BY MOUTH ONCE DAILY WITH SUPPER Patient taking differently: Take 30 mg by mouth every evening. 09/04/22  Yes [name], MD  Ferrous Fumarate (HEMOCYTE - 106 MG FE) 324 (106 Fe) MG TABS tablet Take 1 tablet (106 mg of iron total) by mouth daily. 06/28/22  Yes [name], [name], MD  insulin aspart (NOVOLOG FLEXPEN) 100 UNIT/ML FlexPen Inject 1 Units into the skin 3 (three) times daily with meals. < 150: 0 units. 150-199:1 unit. 200-249: 2 units. 250-299: 3 units. 300-349: 4 units. Over 350: 5 units 05/19/22  Yes Rakes, Connye Burkitt, FNP  insulin degludec (TRESIBA FLEXTOUCH) 100 UNIT/ML FlexTouch Pen Inject 20 Units into the skin at bedtime. Via novo nordisk patient assistance program Patient taking differently: Inject 25 Units into the skin at bedtime. Via novo nordisk patient assistance program 05/03/22  Yes [name], [name], MD  multivitamin (RENA-VIT) TABS tablet Take 1 tablet by mouth daily. 05/13/22  Yes [provider]  nitroGLYCERIN (NITROSTAT) 0.4 MG SL tablet Place 1 tablet (0.4 mg total) under the tongue every 5 (five) minutes as needed for chest pain. 11/09/21  Yes Hochrein, Jeneen Rinks, MD  ondansetron (ZOFRAN) 4 MG tablet TAKE 1 TABLET BY MOUTH EVERY 8 HOURS AS  NEEDED FOR NAUSEA AND VOMITING Patient taking differently: Take 4 mg by mouth every 8 (eight) hours as needed for nausea or vomiting. TAKE 1 TABLET BY MOUTH EVERY 8 HOURS AS NEEDED FOR NAUSEA AND VOMITING 07/03/22  Yes [name], [name], MD  rOPINIRole (REQUIP) 1 MG tablet Take 1 mg by mouth at bedtime.   Yes [provider]  Semaglutide,0.25 or 0.5MG /DOS, (OZEMPIC, 0.25 OR 0.5 MG/DOSE,) 2 MG/1.5ML SOPN Inject 0.5 mg into the skin every 7 (seven) days. Saturday   Yes [provider]  torsemide (DEMADEX) 20 MG tablet Take 20 mg by mouth daily. 07/02/22  Yes [provider]  Continuous Blood Gluc Receiver (FREESTYLE LIBRE 2 READER) DEVI USE TO TEST BLOOD SUGAR CONTINUOUSLY 11/15/21   [name], MD  Continuous Blood Gluc Sensor (FREESTYLE LIBRE 2 SENSOR) MISC Use multiple times daily to track glucose to prevent highs and lows as a complication of dialysis Dx:Z99.2, E11.59 09/15/21   [name], MD  gabapentin (NEURONTIN) 400 MG capsule TAKE 2 CAPSULES AFTER EACH DIALYSIS SESSION Patient taking differently: Take 800 mg by mouth See admin instructions. Take 800 mg by mouth Tuesday, Thursday and Saturday after dialysis 07/27/22   [name], MD  glucose blood (ONETOUCH ULTRA) test strip Use to test blood sugar 3 times daily as directed. DX: E11.9 09/13/20   [name], MD  lanthanum (FOSRENOL) 750 MG chewable tablet SMARTSIG:2 Tablet(s) By Mouth Patient not taking: Reported on 09/29/2022 09/21/22   [provider]  polyethylene glycol (MIRALAX / GLYCOLAX) 17 g packet Take 17 g by mouth daily as needed for moderate constipation or mild constipation. 03/10/22   Pokhrel, Corrie Mckusick, MD  rosuvastatin (CRESTOR) 5 MG tablet Take 1 tablet (5 mg total) by mouth daily. For cholesterol Patient not taking: Reported on 09/29/2022 12/12/21   [name], MD      Allergies    Azithromycin, Ace inhibitors, Clopidogrel bisulfate, Codeine, Lisinopril, and Penicillins    Review of Systems   Review of Systems  Cardiovascular:  Positive for chest pain.    Physical Exam Updated Vital Signs BP (!) 183/81   Pulse 78   Temp 97.8 F (36.6 C) (Oral)   Resp 20   Ht 5\' 3"  (1.6 m)   Wt 79.9 kg   SpO2 97%   BMI 31.19 kg/m  Physical Exam  ED Results / Procedures / Treatments   Labs (all labs ordered are listed, but only abnormal results are displayed) Labs Reviewed  BASIC METABOLIC PANEL - Abnormal; Notable for the following components:      Result  Value   Potassium 3.4 (*)    Glucose, Bld 193 (*)    Creatinine, Ser 7.35 (*)    GFR, Estimated 6 (*)    All other components within normal limits  CBC - Abnormal; Notable for the following components:   WBC 15.6 (*)    Hemoglobin 11.7 (*)    RDW 20.0 (*)    All other components within normal limits  BRAIN NATRIURETIC PEPTIDE - Abnormal; Notable for the following components:   B Natriuretic Peptide >4,500.0 (*)    All other components within normal limits  TROPONIN I (HIGH SENSITIVITY) - Abnormal; Notable for the following components:   Troponin I (High Sensitivity) 60 (*)    All other components within normal limits  TROPONIN I (HIGH SENSITIVITY) - Abnormal; Notable for the following components:   Troponin I (High Sensitivity) 62 (*)    All other components within normal limits  RESP  PANEL BY RT-PCR (FLU A&B, COVID) ARPGX2  EXPECTORATED SPUTUM ASSESSMENT W GRAM STAIN, RFLX TO RESP C  CULTURE, RESPIRATORY W GRAM STAIN  URINALYSIS, ROUTINE W REFLEX MICROSCOPIC    EKG EKG Interpretation  Date/Time:  Friday September 29 2022 17:18:57 EDT Ventricular Rate:  86 PR Interval:    QRS Duration: 102 QT Interval:  438 QTC Calculation: 524 R Axis:   265 Text Interpretation: *** Suspect arm lead reversal, interpretation assumes no reversal Atrial fibrillation Septal infarct (cited on or before 23-Aug-2022) Lateral infarct (cited on or before 06-Sep-2022) Prolonged QT Abnormal ECG When compared with ECG of 18-Sep-2022 13:36, Questionable change in QRS axis Confirmed by Davonna Belling [phone]) on 09/29/2022 6:00:17 PM  Radiology DG Chest 2 View  Result Date: 09/29/2022 CLINICAL DATA:  Chest pain EXAM: CHEST - 2 VIEW COMPARISON:  Chest x-ray dated August 15, 2022; chest CT dated May 15, 2022 FINDINGS: Unchanged cardiomegaly. Mild diffuse bilateral interstitial opacities. Linear opacity of the right lower lobe, likely due to atelectasis. Indeterminate focal nodular opacities seen in the  right lower lobe, new when compared with prior chest x-ray. No large pleural effusion or pneumothorax. Unchanged position of right-sided dialysis catheter. IMPRESSION: 1. Mild diffuse bilateral interstitial opacities, likely due to pulmonary edema. 2. Indeterminate focal nodular opacity seen in the right lower lobe, possibly atelectasis, although given nodules were present on prior chest CT dated May 15, 2022. Recommend further evaluation with repeat chest CT to exclude enlargement. Electronically Signed   By: Yetta Glassman M.D.   On: 09/29/2022 17:53    Procedures Procedures  {Document cardiac monitor, telemetry assessment procedure when appropriate:1}  Medications Ordered in ED Medications  diphenhydrAMINE (BENADRYL) capsule 50 mg (50 mg Oral Given 09/29/22 1952)    ED Course/ Medical Decision Making/ A&P                           Medical Decision Making Amount and/or Complexity of Data Reviewed Labs: ordered. Radiology: ordered.  Risk Decision regarding hospitalization.   ***  {Document critical care time when appropriate:1} {Document review of labs and clinical decision tools ie heart score, Chads2Vasc2 etc:1}  {Document your independent review of radiology images, and any outside records:1} {Document your discussion with family members, caretakers, and with consultants:1} {Document social determinants of health affecting pt's care:1} {Document your decision making why or why not admission, treatments were needed:1} Final Clinical Impression(s) / ED Diagnoses Final diagnoses:  None    Rx / DC Orders ED Discharge Orders     None

## 2022-09-29 NOTE — ED Triage Notes (Signed)
Pt c/o chest pain that started yesterday with pain that has gotten worse today; pt c/o sob, nausea and weakness

## 2022-09-30 ENCOUNTER — Inpatient Hospital Stay (HOSPITAL_COMMUNITY): Payer: Medicare Other

## 2022-09-30 ENCOUNTER — Other Ambulatory Visit (HOSPITAL_COMMUNITY): Payer: Self-pay | Admitting: *Deleted

## 2022-09-30 DIAGNOSIS — E8770 Fluid overload, unspecified: Secondary | ICD-10-CM | POA: Diagnosis present

## 2022-09-30 DIAGNOSIS — I5033 Acute on chronic diastolic (congestive) heart failure: Secondary | ICD-10-CM | POA: Diagnosis not present

## 2022-09-30 DIAGNOSIS — I480 Paroxysmal atrial fibrillation: Secondary | ICD-10-CM

## 2022-09-30 DIAGNOSIS — D638 Anemia in other chronic diseases classified elsewhere: Secondary | ICD-10-CM | POA: Diagnosis not present

## 2022-09-30 DIAGNOSIS — E876 Hypokalemia: Secondary | ICD-10-CM

## 2022-09-30 DIAGNOSIS — J9601 Acute respiratory failure with hypoxia: Secondary | ICD-10-CM | POA: Diagnosis not present

## 2022-09-30 DIAGNOSIS — I25118 Atherosclerotic heart disease of native coronary artery with other forms of angina pectoris: Secondary | ICD-10-CM

## 2022-09-30 DIAGNOSIS — D72829 Elevated white blood cell count, unspecified: Secondary | ICD-10-CM

## 2022-09-30 DIAGNOSIS — Z992 Dependence on renal dialysis: Secondary | ICD-10-CM

## 2022-09-30 DIAGNOSIS — E785 Hyperlipidemia, unspecified: Secondary | ICD-10-CM

## 2022-09-30 DIAGNOSIS — E1169 Type 2 diabetes mellitus with other specified complication: Secondary | ICD-10-CM

## 2022-09-30 DIAGNOSIS — R9431 Abnormal electrocardiogram [ECG] [EKG]: Secondary | ICD-10-CM

## 2022-09-30 DIAGNOSIS — N186 End stage renal disease: Secondary | ICD-10-CM

## 2022-09-30 LAB — COMPREHENSIVE METABOLIC PANEL WITH GFR
ALT: 26 U/L (ref 0–44)
AST: 31 U/L (ref 15–41)
Albumin: 3.7 g/dL (ref 3.5–5.0)
Alkaline Phosphatase: 63 U/L (ref 38–126)
Anion gap: 17 — ABNORMAL HIGH (ref 5–15)
BUN: 21 mg/dL (ref 8–23)
CO2: 24 mmol/L (ref 22–32)
Calcium: 9.9 mg/dL (ref 8.9–10.3)
Chloride: 98 mmol/L (ref 98–111)
Creatinine, Ser: 7.82 mg/dL — ABNORMAL HIGH (ref 0.44–1.00)
GFR, Estimated: 5 mL/min — ABNORMAL LOW
Glucose, Bld: 179 mg/dL — ABNORMAL HIGH (ref 70–99)
Potassium: 3.3 mmol/L — ABNORMAL LOW (ref 3.5–5.1)
Sodium: 139 mmol/L (ref 135–145)
Total Bilirubin: 1 mg/dL (ref 0.3–1.2)
Total Protein: 6.8 g/dL (ref 6.5–8.1)

## 2022-09-30 LAB — HEPATITIS B SURFACE ANTIGEN: Hepatitis B Surface Ag: NONREACTIVE

## 2022-09-30 LAB — CBC WITH DIFFERENTIAL/PLATELET
Abs Immature Granulocytes: 0.06 K/uL (ref 0.00–0.07)
Basophils Absolute: 0.1 K/uL (ref 0.0–0.1)
Basophils Relative: 0 %
Eosinophils Absolute: 0.4 K/uL (ref 0.0–0.5)
Eosinophils Relative: 3 %
HCT: 36.5 % (ref 36.0–46.0)
Hemoglobin: 11.5 g/dL — ABNORMAL LOW (ref 12.0–15.0)
Immature Granulocytes: 0 %
Lymphocytes Relative: 14 %
Lymphs Abs: 2 K/uL (ref 0.7–4.0)
MCH: 27.9 pg (ref 26.0–34.0)
MCHC: 31.5 g/dL (ref 30.0–36.0)
MCV: 88.6 fL (ref 80.0–100.0)
Monocytes Absolute: 1.1 K/uL — ABNORMAL HIGH (ref 0.1–1.0)
Monocytes Relative: 8 %
Neutro Abs: 10.5 K/uL — ABNORMAL HIGH (ref 1.7–7.7)
Neutrophils Relative %: 75 %
Platelets: 216 K/uL (ref 150–400)
RBC: 4.12 MIL/uL (ref 3.87–5.11)
RDW: 20 % — ABNORMAL HIGH (ref 11.5–15.5)
WBC: 14.2 K/uL — ABNORMAL HIGH (ref 4.0–10.5)
nRBC: 0.1 % (ref 0.0–0.2)

## 2022-09-30 LAB — ECHOCARDIOGRAM COMPLETE
Area-P 1/2: 4.8 cm2
Height: 63 in
S' Lateral: 2.9 cm
Weight: 2850.11 [oz_av]

## 2022-09-30 LAB — GLUCOSE, CAPILLARY
Glucose-Capillary: 171 mg/dL — ABNORMAL HIGH (ref 70–99)
Glucose-Capillary: 125 mg/dL — ABNORMAL HIGH (ref 70–99)
Glucose-Capillary: 103 mg/dL — ABNORMAL HIGH (ref 70–99)
Glucose-Capillary: 163 mg/dL — ABNORMAL HIGH (ref 70–99)
Glucose-Capillary: 235 mg/dL — ABNORMAL HIGH (ref 70–99)

## 2022-09-30 LAB — PROCALCITONIN
Procalcitonin: 0.3 ng/mL
Procalcitonin: 0.58 ng/mL

## 2022-09-30 LAB — PHOSPHORUS: Phosphorus: 7.6 mg/dL — ABNORMAL HIGH (ref 2.5–4.6)

## 2022-09-30 LAB — MAGNESIUM: Magnesium: 2.3 mg/dL (ref 1.7–2.4)

## 2022-09-30 LAB — HEMOGLOBIN A1C
Hgb A1c MFr Bld: 6.7 % — ABNORMAL HIGH (ref 4.8–5.6)
Mean Plasma Glucose: 145.59 mg/dL

## 2022-09-30 LAB — MRSA NEXT GEN BY PCR, NASAL: MRSA by PCR Next Gen: DETECTED — AB

## 2022-09-30 LAB — TROPONIN I (HIGH SENSITIVITY): Troponin I (High Sensitivity): 61 ng/L — ABNORMAL HIGH (ref <18)

## 2022-09-30 MED ORDER — NITROGLYCERIN 2 % TD OINT
1.0000 [in_us] | TOPICAL_OINTMENT | Freq: Once | TRANSDERMAL | Status: AC
  Administered 2022-09-30: 1 [in_us] via TOPICAL
  Filled 2022-09-30: qty 1

## 2022-09-30 MED ORDER — APIXABAN 5 MG PO TABS
5.0000 mg | ORAL_TABLET | Freq: Two times a day (BID) | ORAL | Status: DC
  Administered 2022-09-30 – 2022-10-04 (×10): 5 mg via ORAL
  Filled 2022-09-30 (×10): qty 1

## 2022-09-30 MED ORDER — CHLORHEXIDINE GLUCONATE CLOTH 2 % EX PADS
6.0000 | MEDICATED_PAD | Freq: Every day | CUTANEOUS | Status: DC
  Administered 2022-09-30 – 2022-10-04 (×5): 6 via TOPICAL

## 2022-09-30 MED ORDER — SODIUM CHLORIDE 0.9 % IV SOLN
2.0000 g | INTRAVENOUS | Status: DC
  Filled 2022-09-30: qty 2

## 2022-09-30 MED ORDER — VANCOMYCIN HCL IN DEXTROSE 1-5 GM/200ML-% IV SOLN: 1000.0000 mg | INTRAVENOUS | Status: DC

## 2022-09-30 MED ORDER — ALBUTEROL SULFATE (2.5 MG/3ML) 0.083% IN NEBU
2.5000 mg | INHALATION_SOLUTION | RESPIRATORY_TRACT | Status: DC | PRN
  Administered 2022-09-30 (×2): 2.5 mg via RESPIRATORY_TRACT
  Filled 2022-09-30: qty 3

## 2022-09-30 MED ORDER — SODIUM CHLORIDE 0.9 % IV SOLN
2.0000 g | INTRAVENOUS | Status: DC
  Administered 2022-09-30: 2 g via INTRAVENOUS
  Filled 2022-09-30: qty 2

## 2022-09-30 MED ORDER — MUPIROCIN 2 % EX OINT
TOPICAL_OINTMENT | Freq: Two times a day (BID) | CUTANEOUS | Status: DC
  Filled 2022-09-30 (×2): qty 22

## 2022-09-30 MED ORDER — ARFORMOTEROL TARTRATE 15 MCG/2ML IN NEBU
15.0000 ug | INHALATION_SOLUTION | Freq: Two times a day (BID) | RESPIRATORY_TRACT | Status: DC
  Administered 2022-09-30 – 2022-10-04 (×8): 15 ug via RESPIRATORY_TRACT
  Filled 2022-09-30 (×8): qty 2

## 2022-09-30 MED ORDER — BUDESONIDE 0.25 MG/2ML IN SUSP
0.2500 mg | Freq: Two times a day (BID) | RESPIRATORY_TRACT | Status: DC
  Administered 2022-09-30 – 2022-10-04 (×8): 0.25 mg via RESPIRATORY_TRACT
  Filled 2022-09-30 (×8): qty 2

## 2022-09-30 MED ORDER — IPRATROPIUM-ALBUTEROL 0.5-2.5 (3) MG/3ML IN SOLN
3.0000 mL | Freq: Four times a day (QID) | RESPIRATORY_TRACT | Status: DC
  Administered 2022-09-30: 3 mL via RESPIRATORY_TRACT
  Filled 2022-09-30: qty 3

## 2022-09-30 MED ORDER — HEPARIN SODIUM (PORCINE) 1000 UNIT/ML DIALYSIS
1000.0000 [IU] | INTRAMUSCULAR | Status: DC | PRN
  Administered 2022-10-03: 1000 [IU]

## 2022-09-30 MED ORDER — HYDRALAZINE HCL 20 MG/ML IJ SOLN
20.0000 mg | Freq: Once | INTRAMUSCULAR | Status: AC
  Administered 2022-09-30: 20 mg via INTRAVENOUS
  Filled 2022-09-30: qty 1

## 2022-09-30 MED ORDER — FUROSEMIDE 10 MG/ML IJ SOLN
80.0000 mg | Freq: Two times a day (BID) | INTRAMUSCULAR | Status: AC
  Administered 2022-09-30 (×2): 80 mg via INTRAVENOUS
  Filled 2022-09-30 (×2): qty 8

## 2022-09-30 MED ORDER — AMIODARONE HCL 200 MG PO TABS
200.0000 mg | ORAL_TABLET | Freq: Two times a day (BID) | ORAL | Status: DC
  Administered 2022-09-30 – 2022-10-04 (×9): 200 mg via ORAL
  Filled 2022-09-30 (×10): qty 1

## 2022-09-30 MED ORDER — VANCOMYCIN HCL IN DEXTROSE 1-5 GM/200ML-% IV SOLN
1000.0000 mg | INTRAVENOUS | Status: DC
  Administered 2022-09-30 – 2022-10-03 (×3): 1000 mg via INTRAVENOUS
  Filled 2022-09-30 (×3): qty 200

## 2022-09-30 MED ORDER — ALTEPLASE 2 MG IJ SOLR: 2.0000 mg | Freq: Once | INTRAMUSCULAR | Status: DC | PRN

## 2022-09-30 NOTE — Assessment & Plan Note (Signed)
-   Hemoglobin currently stable at 11.7 - Continue to monitor labs - No signs or symptoms of bleeding at this time

## 2022-09-30 NOTE — Assessment & Plan Note (Signed)
-   25 units basal insulin at baseline - Reduced dose of insulin while in hospital - Heart healthy/carb modified diet - Monitor CBGs

## 2022-09-30 NOTE — Assessment & Plan Note (Addendum)
-   With pulmonary edema on chest x-ray - History of diastolic CHF - Acute on chronic - Last echo showed an ejection fraction of 55% with indeterminate diastolic parameters - Continue aspirin, beta-blocker, statin, and Lasix - Volume control will be deferred to nephrology - Patient is due for dialysis tomorrow - Continue to monitor

## 2022-09-30 NOTE — Assessment & Plan Note (Signed)
Continue statin. 

## 2022-09-30 NOTE — Assessment & Plan Note (Signed)
-   Continue diltiazem, amiodarone, beta-blocker, Eliquis

## 2022-09-30 NOTE — Assessment & Plan Note (Signed)
-   White blood cell count 15.6 - Patient does have a cough, but no other infectious symptoms - Expectorated sputum culture ordered - UA pending - Afebrile - Procalcitonin ordered - Continue to monitor

## 2022-09-30 NOTE — Progress Notes (Signed)
*  PRELIMINARY RESULTS* Echocardiogram 2D Echocardiogram has been performed.  [name] 09/30/2022, 12:42 PM

## 2022-09-30 NOTE — Assessment & Plan Note (Addendum)
-   Continue aspirin, beta-blocker, statin - Troponins are elevated but flat at 60, 62 - Patient chronically has a troponin leak - Cycle again in the a.m. - No acute ischemic changes on EKG - Continue to monitor on telemetry

## 2022-09-30 NOTE — Progress Notes (Signed)
Patients dulera has been placed in room. Also BiPAP dream station has been placed in room. 14/6 auto with 3 liters bled in  , Medium mask.

## 2022-09-30 NOTE — Progress Notes (Signed)
Patient taken off BiPAP about 0400. Patient had some nausea. Now on oxygen only.

## 2022-09-30 NOTE — Procedures (Signed)
    HEMODIALYSIS TREATMENT NOTE:    Uneventful 3.25 hour heparin-free treatment completed.  Catheter entry site is unremarkable.  Goal met: 3.5 liters removed without interruption in UF.  All blood was returned.  Hemodynamically stable.  AVG placed "a few weeks ago" in right upper arm.  Per pt, has been draining.  She had a white gauze kerlex bandage that her mother changed yesterday "and now it's stuck to me."  Easily removed after dampening with saline.  Minimal serosanguinous drainage noted.  No erythema or tenderness.  Foam dressing applied.  Reports she was taking "antibiotics at dialysis" for this.  Order received for Vancomycin and Tressie Ellis when treatment was nearly complete.  Administration deferred to primary nursing service.   Rockwell Alexandria, RN

## 2022-09-30 NOTE — Progress Notes (Signed)
Lab called positive for MRSA in the nares. Will order MRSA protocol.

## 2022-09-30 NOTE — Assessment & Plan Note (Signed)
-   Acute on chronic respiratory failure - Requires 2 L nasal cannula only at night at home - Requiring 2 L nasal cannula here to maintain O2 sats - Secondary to pulmonary edema, COPD - Albuterol as needed - BiPAP for pulmonary edema - Patient does make urine, Lasix ordered defer further Lasix therapy to nephrology - COVID and flu are negative - Respiratory culture pending - Chest x-ray showed mild diffuse bilateral interstitial opacities consistent with edema - Patient does have a leukocytosis of 15.6 - Continue to monitor

## 2022-09-30 NOTE — Progress Notes (Addendum)
Patient admitted to the hospital earlier this morning by Dr. Clearence Ped  Patient seen and examined. Says she feels a little better in terms of her shortness of breath as compared to admission. She has not made much urine despite lasix. Blood pressure remains elevated  ESRD on HD -T-Th-Sat at [facility] -patient reports that last treatment was on Thursday and was full treatment -she reports compliance with her salt/fluid intake -Informed nephrology of patient so that she may continue dialysis treatments  Acute on chronic diastolic CHF -volume status managed with HD -currently volume overloaded -continue BB, ASA, statin  Insulin dependent DM type 2 -continue basal insulin -sliding scale insulin  Acute on chronic respiratory failure with hypoxia  -related to pulmonary edema -normally wears oxygen only at night -briefly required bipap on admission for respiratory distress -currently seems to be more comfortable on 3L -try and wean oxygen as with volume removal  COPD -no wheezing at this time -continue bronchodilators -continue dulera  Pulmonary hypertension -felt to be secondary to COPD  PAF -continue diltiazem, amiodarone, atenolol and eliquis  Dialysis graft infection -Informed by patient that she was recently started on IV antibiotics with dialysis for dialysis graft infection -Since she is followed at [facility], I was able to reach out to one of the nephrologists from her primary nephrologist group and reviewed her records.  Per records, there was concern that she had an infection in her dialysis graft due to bloody drainage from this area and she was started on vancomycin and Fortaz on 10/24 to be taken after dialysis for the next 2 weeks.  She has an appointment to see her vascular surgeon on 11/1.  We will continue vancomycin and Tressie Ellis here in the hospital so as not to interrupt her antibiotic course.  [employer]

## 2022-09-30 NOTE — H&P (Signed)
History and Physical    Patient: [name] LEX:[id number] DOB: 1950/12/28 DOA: 09/29/2022 DOS: the patient was seen and examined on 09/30/2022 PCP: [name], MD  Patient coming from: Home  Chief Complaint:  Chief Complaint  Patient presents with   Chest Pain   HPI: AZIZAH LISLE is a 71 y.o. female with medical history significant of congestive heart failure, anemia, anxiety, coronary artery disease, COPD, diabetic neuropathy, type 2 diabetes mellitus, GERD, hyperlipidemia, hypertension, and more presents ED with a chief complaint of chest pain.  Patient reports she has had a pressure-like pain.  Is been ongoing intermittently for a while.  Today it was worse than normal.  She has been coughing, and feels like there is sputum to bring up, but cannot get it up.  When she does get up its either white and frothy or yellow in thick mucus.  Patient reports her chest pain for this episode started 2 days ago.  She was laying down when it started.  Laying flat makes it worse.  Exertion makes it worse.  Nothing makes it better.  Patient reports she did not feel better in the ER.  At home she has felt minimal improvement after using bronchodilators.  Patient reports at home she is on 2 L nasal cannula at night.  The oxygen supplementation also helps her chest pressure.  Coughing makes her chest pressure worse.  She has had nausea but no vomiting.  She has had a decreased appetite with abdominal pain that is vague but seems to be located below her umbilicus.  She does not have any diarrhea.  She is chronically constipated and has had no change to bowel habits.  Her last bowel movement was today.  Before that it was 3 days before that.  Patient reports it is normal for her to either go every other day or every 3 days.  Patient is unsure if she has blood in her stool because she is on an iron supplement.  Patient reports she does see blood in her urine at times.  She reports that it is bright red and  changes to the color of the commode.  She produces urine about twice per day.  Patient has no other complaints at this time.  Patient does not smoke, does not drink, does not use illicit drugs.  She is vaccinated for COVID.  Patient is full code. Review of Systems: As mentioned in the history of present illness. All other systems reviewed and are negative. Past Medical History:  Diagnosis Date   Acute on chronic diastolic CHF (congestive heart failure) ([place]) 03/05/2015   Grade 2. EF 60-65%   Anemia    hx of   Anxiety    Arthritis    CAD (coronary artery disease)    s/p Stenting in 2005;  [place] 7/09: Vigorous LV function, 2-3+ MR, RI 40%, RI stent patent, proximal-mid RCA 40-50%;  Echo 7/09:  EF 55-65%, trivial MR;  [place] 11/18/12: EF 66%, normal LV wall motion, mild to moderate anterior ischemia - reviewed by [facility] and felt to rep breast atten (low risk);  Echo 6/14: mild LVH, EF 55-65%, Gr 2 DD, PASP 44, trivial eff    Chronic kidney disease    CREATININE IS UP--GOING TO KIDNEY MD    Chronic neck pain    COPD (chronic obstructive pulmonary disease) (HCC)    Degenerative joint disease    left shoulder   Diabetic neuropathy (HCC)    Diverticulosis    Esophageal  stricture    GERD (gastroesophageal reflux disease)    Hyperlipidemia    Hypertension    IDDM (insulin dependent diabetes mellitus)    Pericardial effusion 03/06/2015   Very small per echo ; pleural nodules seen on CT chest.   Peripheral vascular disease ([place])    Stroke ([place])    "mini- years ago"   Vitamin D deficiency    Past Surgical History:  Procedure Laterality Date   A/V FISTULAGRAM N/A 08/10/2017   Procedure: A/V Fistulagram - Right Arm;  Surgeon: Elam Dutch, MD;  Location: [place] [facility];  Service: Cardiovascular;  Laterality: N/A;   A/V FISTULAGRAM N/A 03/03/2020   Procedure: A/V FISTULAGRAM - Right Arm;  Surgeon: Marty Heck, MD;  Location: [place] [facility];  Service: Cardiovascular;  Laterality:  N/A;   ABDOMINAL HYSTERECTOMY     AV FISTULA PLACEMENT Right 07/20/2015   Procedure: RADIAL CEPHALIC ARTERIOVENOUS FISTULA CREATION RIGHT ARM;  Surgeon: Angelia Mould, MD;  Location: [place];  Service: Vascular;  Laterality: Right;   AV FISTULA PLACEMENT Right 11/26/2015   Procedure:  Creation of RIGHT BRACHIO-CEPHALIC arteriovenous fistula;  Surgeon: Angelia Mould, MD;  Location: [place];  Service: Vascular;  Laterality: Right;   AV FISTULA PLACEMENT Right 08/15/2022   Procedure: INSERTION OF RIGHT UPPER ARM ARTERIOVENOUS (AV) GORE-TEX GRAFT;  Surgeon: Rosetta Posner, MD;  Location: [facility];  Service: Vascular;  Laterality: Right;   BACK SURGERY     2007 & 2013   [facility]  2003, 2005, 2009   1 stent   cardiac stents     CARDIOVERSION N/A 04/24/2022   Procedure: CARDIOVERSION;  Surgeon: Fay Records, MD;  [facility];  Service: Cardiovascular;  Laterality: N/A;   CATARACT EXTRACTION W/PHACO Right 05/04/2014   Procedure: CATARACT EXTRACTION PHACO AND INTRAOCULAR LENS PLACEMENT ([place]);  Surgeon: Williams Che, MD;  Location: [facility];  Service: Ophthalmology;  Laterality: Right;  CDE 1.47   CATARACT EXTRACTION W/PHACO Left 07/20/2014   Procedure: CATARACT EXTRACTION WITH PHACO AND INTRAOCULAR LENS PLACEMENT LEFT EYE CDE=1.79;  Surgeon: Williams Che, MD;  Location: [facility];  Service: Ophthalmology;  Laterality: Left;   CERVICAL SPINE SURGERY  2012   8 screws   CORONARY ATHERECTOMY N/A 03/08/2022   Procedure: CORONARY ATHERECTOMY;  Surgeon: Sherren Mocha, MD;  Location: [place] [facility];  Service: Cardiovascular;  Laterality: N/A;   CYSTOSCOPY WITH INJECTION N/A 06/03/2013   Procedure: MACROPLASTIQUE  INJECTION ;  Surgeon: Malka So, MD;  Location: [facility];  Service: Urology;  Laterality: N/A;   ESOPHAGEAL DILATION Right 06/02/2022   Procedure: ESOPHAGEAL DILATION;  Surgeon: Harvel Quale, MD;  [facility];   Service: Gastroenterology;  Laterality: Right;   ESOPHAGOGASTRODUODENOSCOPY (EGD) WITH PROPOFOL N/A 06/02/2022   Procedure: ESOPHAGOGASTRODUODENOSCOPY (EGD) WITH PROPOFOL;  Surgeon: Harvel Quale, MD;  [facility];  Service: Gastroenterology;  Laterality: N/A;  TO BE DONE IN OR 2   FISTULOGRAM Right 10/20/2016   Procedure: FISTULOGRAM WITH POSSIBLE INTERVENTION;  Surgeon: [name], MD;  Location: [facility];  Service: Vascular;  Laterality: Right;   GIVENS CAPSULE STUDY N/A 06/11/2022   Procedure: GIVENS CAPSULE STUDY;  Surgeon: Clarene Essex, MD;  Location: [place];  Service: Gastroenterology;  Laterality: N/A;   HEMOSTASIS CLIP PLACEMENT  06/02/2022   Procedure: HEMOSTASIS CLIP PLACEMENT;  Surgeon: Harvel Quale, MD;  [facility];  Service: Gastroenterology;;   HOT HEMOSTASIS  06/02/2022   Procedure: HOT HEMOSTASIS (ARGON PLASMA COAGULATION/BICAP);  Surgeon: Montez Morita, Quillian Quince, MD;  [facility];  Service: Gastroenterology;;   INSERTION OF DIALYSIS CATHETER N/A 11/26/2015   Procedure: INSERTION OF DIALYSIS CATHETER;  Surgeon: Angelia Mould, MD;  Location: [place];  Service: Vascular;  Laterality: N/A;   INSERTION OF DIALYSIS CATHETER Right 08/15/2022   Procedure: INSERTION OF TUNNELED DIALYSIS CATHETER;  Surgeon: Rosetta Posner, MD;  [facility]: [facility];  Service: Vascular;  Laterality: Right;   INTRAVASCULAR IMAGING/OCT N/A 03/08/2022   Procedure: INTRAVASCULAR IMAGING/OCT;  Surgeon: Sherren Mocha, MD;  Location: [place] [facility];  Service: Cardiovascular;  Laterality: N/A;   LAMINECTOMY  12/29/2011   LEFT HEART CATH AND CORONARY ANGIOGRAPHY N/A 03/06/2022   Procedure: LEFT HEART CATH AND CORONARY ANGIOGRAPHY;  Surgeon: Nelva Bush, MD;  Location: [place] [facility];  Service: Cardiovascular;  Laterality: N/A;   LUMBAR LAMINECTOMY/DECOMPRESSION MICRODISCECTOMY  12/29/2011   Procedure: LUMBAR LAMINECTOMY/DECOMPRESSION  MICRODISCECTOMY;  Surgeon: Floyce Stakes, MD;  Location: [place] [facility];  Service: Neurosurgery;  Laterality: N/A;  Lumbar Two through Lumbar Five Laminectomies/Cellsaver   PARTIAL HYSTERECTOMY     PERIPHERAL VASCULAR BALLOON ANGIOPLASTY  03/03/2020   Procedure: PERIPHERAL VASCULAR BALLOON ANGIOPLASTY;  Surgeon: Marty Heck, MD;  Location: [place] [facility];  Service: Cardiovascular;;  Rt arm fistula   PERIPHERAL VASCULAR BALLOON ANGIOPLASTY Right 04/15/2021   Procedure: PERIPHERAL VASCULAR BALLOON ANGIOPLASTY;  Surgeon: Angelia Mould, MD;  Location: [place] [facility];  Service: Cardiovascular;  Laterality: Right;  arm fistula   PERIPHERAL VASCULAR BALLOON ANGIOPLASTY  03/14/2022   Procedure: PERIPHERAL VASCULAR BALLOON ANGIOPLASTY;  Surgeon: Broadus John, MD;  Location: [place] [facility];  Service: Cardiovascular;;  right arm AVF   PERIPHERAL VASCULAR CATHETERIZATION N/A 11/22/2015   Procedure: Fistulagram;  Surgeon: Angelia Mould, MD;  Location: [place] [facility];  Service: Cardiovascular;  Laterality: N/A;   PERIPHERAL VASCULAR INTERVENTION  08/10/2017   Procedure: PERIPHERAL VASCULAR INTERVENTION;  Surgeon: Elam Dutch, MD;  Location: [place] [facility];  Service: Cardiovascular;;   SHOULDER SURGERY Right    Rotator cuff   Social History:  reports that she quit smoking about 20 years ago. Her smoking use included cigarettes. She has a 30.00 pack-year smoking history. She has never been exposed to tobacco smoke. She has never used smokeless tobacco. She reports that she does not drink alcohol and does not use drugs.  Allergies  Allergen Reactions   Azithromycin     Prolonged QT   Ace Inhibitors Cough   Clopidogrel Bisulfate Other (See Comments)    Sick, Headache, "Felt terrible"   Codeine Other (See Comments)    hallucinations   Lisinopril Cough   Penicillins Other (See Comments)    Unknown childhood reaction Did it involve swelling of the  face/tongue/throat, SOB, or low BP? Unknown Did it involve sudden or severe rash/hives, skin peeling, or any reaction on the inside of your mouth or nose? Unknown Did you need to seek medical attention at a hospital or doctor's office? Unknown When did it last happen?      childhood allergy If all above answers are "NO", may proceed with cephalosporin use.      Family History  Problem Relation Age of Onset   Heart disease Other    Hypertension Father    Cancer Father    Hypertension Mother    Hypertension Daughter  Pancreatitis Brother    Hypertension Daughter    Diabetes Daughter     Prior to Admission medications   Medication Sig Start Date End Date Taking? Authorizing Provider  acetaminophen (TYLENOL) 500 MG tablet Take 1,000 mg by mouth every 6 (six) hours as needed for moderate pain or headache.   Yes [provider]  albuterol (PROVENTIL) (2.5 MG/3ML) 0.083% nebulizer solution Take 3 mLs (2.5 mg total) by nebulization every 6 (six) hours as needed for wheezing or shortness of breath. 12/02/21  Yes [name], MD  amiodarone (PACERONE) 200 MG tablet Take 1 tablet by mouth twice a day for 2 weeks then reduce to 1 tablet daily Patient taking differently: Take 200 mg by mouth 2 (two) times daily. 09/06/22  Yes Fenton, Clint R, PA  apixaban (ELIQUIS) 5 MG TABS tablet Take 1 tablet (5 mg total) by mouth 2 (two) times daily. 04/05/22  Yes Minus Breeding, MD  aspirin EC 81 MG tablet Take 1 tablet (81 mg total) by mouth daily. Swallow whole. 06/15/22  Yes Vashti Hey, MD  atenolol (TENORMIN) 100 MG tablet Take 0.5 tablets (50 mg total) by mouth at bedtime. 08/02/22  Yes Sherran Needs, NP  budesonide-formoterol [facility]) 160-4.5 MCG/ACT inhaler Inhale 2 puffs into the lungs 2 (two) times daily. Patient taking differently: Inhale 2 puffs into the lungs 2 (two) times daily as needed (shortness of breath). 05/03/22  Yes [name], [name], MD   Carboxymethylcellul-Glycerin (LUBRICATING EYE DROPS OP) Place 1 drop into both eyes daily as needed (dry eyes).   Yes [provider]  cetirizine (ZYRTEC) 10 MG tablet Take 1 tablet (10 mg total) by mouth daily as needed for allergies. Patient taking differently: Take 10 mg by mouth daily. 06/15/22  Yes Vashti Hey, MD  diltiazem (CARDIZEM CD) 240 MG 24 hr capsule Take 1 capsule (240 mg total) by mouth daily. 05/17/22  Yes Minus Breeding, MD  DULoxetine (CYMBALTA) 30 MG capsule TAKE 1 CAPSULE BY MOUTH ONCE DAILY WITH SUPPER Patient taking differently: Take 30 mg by mouth every evening. 09/04/22  Yes [name], MD  Ferrous Fumarate (HEMOCYTE - 106 MG FE) 324 (106 Fe) MG TABS tablet Take 1 tablet (106 mg of iron total) by mouth daily. 06/28/22  Yes [name], [name], MD  insulin aspart (NOVOLOG FLEXPEN) 100 UNIT/ML FlexPen Inject 1 Units into the skin 3 (three) times daily with meals. < 150: 0 units. 150-199:1 unit. 200-249: 2 units. 250-299: 3 units. 300-349: 4 units. Over 350: 5 units 05/19/22  Yes Rakes, Connye Burkitt, FNP  insulin degludec (TRESIBA FLEXTOUCH) 100 UNIT/ML FlexTouch Pen Inject 20 Units into the skin at bedtime. Via novo nordisk patient assistance program Patient taking differently: Inject 25 Units into the skin at bedtime. Via novo nordisk patient assistance program 05/03/22  Yes [name], [name], MD  multivitamin (RENA-VIT) TABS tablet Take 1 tablet by mouth daily. 05/13/22  Yes [provider]  nitroGLYCERIN (NITROSTAT) 0.4 MG SL tablet Place 1 tablet (0.4 mg total) under the tongue every 5 (five) minutes as needed for chest pain. 11/09/21  Yes Minus Breeding, MD  ondansetron (ZOFRAN) 4 MG tablet TAKE 1 TABLET BY MOUTH EVERY 8 HOURS AS NEEDED FOR NAUSEA AND VOMITING Patient taking differently: Take 4 mg by mouth every 8 (eight) hours as needed for nausea or vomiting. TAKE 1 TABLET BY MOUTH EVERY 8 HOURS AS NEEDED FOR NAUSEA AND VOMITING 07/03/22  Yes [name],  [name], MD  rOPINIRole (REQUIP) 1 MG tablet Take 1 mg  by mouth at bedtime.   Yes [provider]  Semaglutide,0.25 or 0.5MG /DOS, (OZEMPIC, 0.25 OR 0.5 MG/DOSE,) 2 MG/1.5ML SOPN Inject 0.5 mg into the skin every 7 (seven) days. Saturday   Yes [provider]  torsemide (DEMADEX) 20 MG tablet Take 20 mg by mouth daily. 07/02/22  Yes [provider]  Continuous Blood Gluc Receiver (FREESTYLE LIBRE 2 READER) DEVI USE TO TEST BLOOD SUGAR CONTINUOUSLY 11/15/21   [name], MD  Continuous Blood Gluc Sensor (FREESTYLE LIBRE 2 SENSOR) MISC Use multiple times daily to track glucose to prevent highs and lows as a complication of dialysis Dx:Z99.2, E11.59 09/15/21   [name], MD  gabapentin (NEURONTIN) 400 MG capsule TAKE 2 CAPSULES AFTER EACH DIALYSIS SESSION Patient taking differently: Take 800 mg by mouth See admin instructions. Take 800 mg by mouth Tuesday, Thursday and Saturday after dialysis 07/27/22   [name], MD  glucose blood (ONETOUCH ULTRA) test strip Use to test blood sugar 3 times daily as directed. DX: E11.9 09/13/20   [name], MD  lanthanum (FOSRENOL) 750 MG chewable tablet SMARTSIG:2 Tablet(s) By Mouth Patient not taking: Reported on 09/29/2022 09/21/22   [provider]  polyethylene glycol (MIRALAX / GLYCOLAX) 17 g packet Take 17 g by mouth daily as needed for moderate constipation or mild constipation. 03/10/22   Pokhrel, Corrie Mckusick, MD  rosuvastatin (CRESTOR) 5 MG tablet Take 1 tablet (5 mg total) by mouth daily. For cholesterol Patient not taking: Reported on 09/29/2022 12/12/21   [name], MD    Physical Exam: Vitals:   09/29/22 2200 09/29/22 2300 09/29/22 2338 09/30/22 0000  BP: (!) 183/81 (!) 188/66 (!) 190/80 (!) 186/72  Pulse: 78 (!) 102 71 82  Resp: 20 18 20  (!) 24  Temp:      TempSrc:   Oral   SpO2: 97% 97% 98% 99%  Weight:   80.8 kg   Height:   5\' 3"  (1.6 m)    1.  General: Patient lying supine in bed,  no acute  distress   2. Psychiatric: Alert and oriented x 3, mood and behavior normal for situation, pleasant and cooperative with exam   3. Neurologic: Speech and language are normal, face is symmetric, moves all 4 extremities voluntarily, at baseline without acute deficits on limited exam   4. HEENMT:  Head is atraumatic, normocephalic, pupils reactive to light, neck is supple, trachea is midline, mucous membranes are moist   5. Respiratory : Lungs are clear to auscultation bilaterally without wheezing, no rhonchi, there are crackles at the bases, no cyanosis, no increase in work of breathing or accessory muscle use   6. Cardiovascular : Heart rate normal, rhythm is irregular, no murmurs, rubs or gallops, no peripheral edema, peripheral pulses palpated   7. Gastrointestinal:  Abdomen is soft, nondistended, nontender to palpation bowel sounds active, no masses or organomegaly palpated   8. Skin:  Skin is warm, dry and intact without rashes, acute lesions, or ulcers on limited exam   9.Musculoskeletal:  No acute deformities or trauma, no asymmetry in tone, no peripheral edema, peripheral pulses palpated, no tenderness to palpation in the extremities  Data Reviewed: In the ED Temp 97.8, heart rate 74-104, respiratory rate 20-24, blood pressure 156/68-183/98, satting 97% on 2 L nasal cannula Leukocytosis at 15.6, hemoglobin 11.7, platelets 249 Hypokalemia at 3.4, BUN 15, creatinine 7.35 Hyperglycemia 193 GFR 6 BNP greater than 4500 Previous BNP was [address] 60, 62 COVID and flu negative Benadryl given for nausea  in the setting of prolonged QT  Assessment and Plan: * CHF exacerbation ([place]) - With pulmonary edema on chest x-ray - BNP greater than 4500 - Orthopnea, dyspnea on exertion - History of diastolic CHF - Last echo showed an ejection fraction of 55% with indeterminate diastolic parameters - Continue aspirin, beta-blocker, statin, and Lasix - Volume control will be deferred  to nephrology - Patient is due for dialysis tomorrow - Continue to monitor  ESRD (end stage renal disease) on dialysis [facility]) - Tuesday Thursday Saturday dialysis schedule - Last dialysis Thursday - Due for dialysis later today - Nephrology consult ordered - Continue to monitor  Type 2 diabetes mellitus with hyperlipidemia (HCC) - 25 units basal insulin at baseline - Reduced dose of insulin while in hospital - Heart healthy/carb modified diet - Monitor CBGs  Paroxysmal atrial fibrillation (HCC) - Continue diltiazem, amiodarone, beta-blocker, Eliquis  Acute respiratory failure with hypoxia (HCC) - Acute on chronic respiratory failure - Requires 2 L nasal cannula only at night at home - Requiring 2 L nasal cannula here to maintain O2 sats - Secondary to pulmonary edema, COPD - Albuterol as needed - BiPAP for pulmonary edema - Patient does make urine, Lasix ordered defer further Lasix therapy to nephrology - COVID and flu are negative - Respiratory culture pending - Chest x-ray showed mild diffuse bilateral interstitial opacities consistent with edema - Patient does have a leukocytosis of 15.6 - Continue to monitor  Dyslipidemia - Continue statin  Hypokalemia - Potassium 3.4 - Electrolyte management deferred to nephrology  Leukocytosis - White blood cell count 15.6 - Patient does have a cough, but no other infectious symptoms - Expectorated sputum culture ordered - UA pending - Afebrile - Procalcitonin ordered - Continue to monitor  Anemia of chronic disease/ESRD - Hemoglobin currently stable at 11.7 - Continue to monitor labs - No signs or symptoms of bleeding at this time  Prolonged Q-T interval on ECG - QTc 571 - Monitor closely on telemetry - Electrolyte management deferred to nephrology   Coronary artery disease of native artery of native heart with stable angina pectoris (HCC) - Continue aspirin, beta-blocker, statin - Troponins are elevated but flat  at 60, 62 - Patient chronically has a troponin leak - Cycle again in the a.m. - No acute ischemic changes on EKG - Continue to monitor on telemetry      Advance Care Planning:   Code Status: Full Code   Consults: Nephrology  Family Communication: No family at bedside  Severity of Illness: The appropriate patient status for this patient is OBSERVATION. Observation status is judged to be reasonable and necessary in order to provide the required intensity of service to ensure the patient's safety. [name] presenting symptoms, physical exam findings, and initial radiographic and laboratory data in the context of their medical condition is felt to place them at decreased risk for further clinical deterioration. Furthermore, it is anticipated that the patient will be medically stable for discharge from the hospital within 2 midnights of admission.   Author: Rolla Plate, DO 09/30/2022 1:36 AM  For on call review [email or web address].

## 2022-09-30 NOTE — Assessment & Plan Note (Signed)
-   QTc 571 - Monitor closely on telemetry - Electrolyte management deferred to nephrology

## 2022-09-30 NOTE — Assessment & Plan Note (Signed)
-   Tuesday Thursday Saturday dialysis schedule - Last dialysis Thursday - Due for dialysis later today - Nephrology consult ordered - Continue to monitor

## 2022-09-30 NOTE — Progress Notes (Signed)
Patient has upper air wheezes , still has trouble breathing flat , BP still elevated. Most likely still in failure. Gave neb with albuterol 2 doses. Monitor shows A-fib. Placed on BiPAP for sleep Should help if she will wear. Left nasal cannula on under BiPAP mask ( if patient removes mask still has oxygen). BiPAP has 4 liters oxygen added also. Saturation 100 percent. Dialysis on Saturday.

## 2022-09-30 NOTE — Assessment & Plan Note (Signed)
-   Potassium 3.4 - Electrolyte management deferred to nephrology

## 2022-10-01 ENCOUNTER — Inpatient Hospital Stay (HOSPITAL_COMMUNITY): Payer: Medicare Other

## 2022-10-01 DIAGNOSIS — D638 Anemia in other chronic diseases classified elsewhere: Secondary | ICD-10-CM | POA: Diagnosis not present

## 2022-10-01 DIAGNOSIS — I5033 Acute on chronic diastolic (congestive) heart failure: Secondary | ICD-10-CM | POA: Diagnosis not present

## 2022-10-01 DIAGNOSIS — J9601 Acute respiratory failure with hypoxia: Secondary | ICD-10-CM | POA: Diagnosis not present

## 2022-10-01 DIAGNOSIS — N186 End stage renal disease: Secondary | ICD-10-CM | POA: Diagnosis not present

## 2022-10-01 LAB — GLUCOSE, CAPILLARY
Glucose-Capillary: 83 mg/dL (ref 70–99)
Glucose-Capillary: 207 mg/dL — ABNORMAL HIGH (ref 70–99)
Glucose-Capillary: 166 mg/dL — ABNORMAL HIGH (ref 70–99)
Glucose-Capillary: 373 mg/dL — ABNORMAL HIGH (ref 70–99)

## 2022-10-01 LAB — HEPATITIS B SURFACE ANTIBODY, QUANTITATIVE: Hep B S AB Quant (Post): 48.5 m[IU]/mL

## 2022-10-01 LAB — PROCALCITONIN: Procalcitonin: 0.67 ng/mL

## 2022-10-01 MED ORDER — METHYLPREDNISOLONE SODIUM SUCC 40 MG IJ SOLR
40.0000 mg | Freq: Two times a day (BID) | INTRAMUSCULAR | Status: DC
  Administered 2022-10-01 – 2022-10-02 (×3): 40 mg via INTRAVENOUS
  Filled 2022-10-01 (×2): qty 1

## 2022-10-01 MED ORDER — IPRATROPIUM-ALBUTEROL 0.5-2.5 (3) MG/3ML IN SOLN
3.0000 mL | Freq: Four times a day (QID) | RESPIRATORY_TRACT | Status: DC
  Administered 2022-10-01 – 2022-10-03 (×7): 3 mL via RESPIRATORY_TRACT
  Filled 2022-10-01 (×9): qty 3

## 2022-10-01 MED ORDER — SODIUM CHLORIDE 0.9 % IV SOLN
1.0000 g | INTRAVENOUS | Status: DC
  Filled 2022-10-01 (×2): qty 1

## 2022-10-01 MED ORDER — LIVING BETTER WITH HEART FAILURE BOOK: Freq: Once | Status: DC

## 2022-10-01 MED ORDER — GUAIFENESIN-DM 100-10 MG/5ML PO SYRP
5.0000 mL | ORAL_SOLUTION | ORAL | Status: DC | PRN
  Administered 2022-10-01 – 2022-10-03 (×5): 5 mL via ORAL
  Filled 2022-10-01 (×5): qty 5

## 2022-10-01 MED ORDER — LANTHANUM CARBONATE 500 MG PO CHEW
500.0000 mg | CHEWABLE_TABLET | Freq: Three times a day (TID) | ORAL | Status: DC
  Administered 2022-10-02 – 2022-10-04 (×7): 500 mg via ORAL
  Filled 2022-10-01 (×15): qty 1

## 2022-10-01 NOTE — Consult Note (Signed)
[facility] Renal Consultation Note  Requesting MD: Kathie Dike, MD Indication for Consultation:  ESRD  Chief complaint: chest pain  HPI:  [name] is a 71 y.o. female with a history of ESRD on HD at [facility], diastolic CHF, CAD, COPD, HTN, and diabetes who presented to the hospital with chest pain and cough.  She also reported shortness of breath worse with lying flat.  She was felt to have a CHF exacerbation as well as COPD exacerbation.  She was admitted to the ICU at [facility].  She underwent HD here on 10/28 with 3.5 kg UF.  Some improvement with HD but things got better today after steroids and nebs.  Nephrology was consulted for assistance with management of HD.  She had a graft placed with Dr. Caffie Damme and she has an appt with him this coming Wednesday to reassess.    PMHx:   Past Medical History:  Diagnosis Date   Acute on chronic diastolic CHF (congestive heart failure) ([place]) 03/05/2015   Grade 2. EF 60-65%   Anemia    hx of   Anxiety    Arthritis    CAD (coronary artery disease)    s/p Stenting in 2005;  [place] 7/09: Vigorous LV function, 2-3+ MR, RI 40%, RI stent patent, proximal-mid RCA 40-50%;  Echo 7/09:  EF 55-65%, trivial MR;  Myoview 11/18/12: EF 66%, normal LV wall motion, mild to moderate anterior ischemia - reviewed by [facility] and felt to rep breast atten (low risk);  Echo 6/14: mild LVH, EF 55-65%, Gr 2 DD, PASP 44, trivial eff    Chronic kidney disease    CREATININE IS UP--GOING TO KIDNEY MD    Chronic neck pain    COPD (chronic obstructive pulmonary disease) (HCC)    Degenerative joint disease    left shoulder   Diabetic neuropathy (HCC)    Diverticulosis    Esophageal stricture    GERD (gastroesophageal reflux disease)    Hyperlipidemia    Hypertension    IDDM (insulin dependent diabetes mellitus)    Pericardial effusion 03/06/2015   Very small per echo ; pleural nodules seen on CT chest.   Peripheral vascular disease ([place])    Stroke  ([place])    "mini- years ago"   Vitamin D deficiency     Past Surgical History:  Procedure Laterality Date   A/V FISTULAGRAM N/A 08/10/2017   Procedure: A/V Fistulagram - Right Arm;  Surgeon: Elam Dutch, MD;  Location: [place] CV [facility];  Service: Cardiovascular;  Laterality: N/A;   A/V FISTULAGRAM N/A 03/03/2020   Procedure: A/V FISTULAGRAM - Right Arm;  Surgeon: Marty Heck, MD;  Location: [place] [facility];  Service: Cardiovascular;  Laterality: N/A;   ABDOMINAL HYSTERECTOMY     AV FISTULA PLACEMENT Right 07/20/2015   Procedure: RADIAL CEPHALIC ARTERIOVENOUS FISTULA CREATION RIGHT ARM;  Surgeon: Angelia Mould, MD;  Location: [place];  Service: Vascular;  Laterality: Right;   AV FISTULA PLACEMENT Right 11/26/2015   Procedure:  Creation of RIGHT BRACHIO-CEPHALIC arteriovenous fistula;  Surgeon: Angelia Mould, MD;  Location: [place];  Service: Vascular;  Laterality: Right;   AV FISTULA PLACEMENT Right 08/15/2022   Procedure: INSERTION OF RIGHT UPPER ARM ARTERIOVENOUS (AV) GORE-TEX GRAFT;  Surgeon: Rosetta Posner, MD;  Location: [facility];  Service: Vascular;  Laterality: Right;   BACK SURGERY     2007 & 2013   [place]  CATHETERIZATION  2003, 2005, 2009   1 stent   cardiac stents     CARDIOVERSION N/A 04/24/2022   Procedure: CARDIOVERSION;  Surgeon: Fay Records, MD;  Location: [facility];  Service: Cardiovascular;  Laterality: N/A;   CATARACT EXTRACTION W/PHACO Right 05/04/2014   Procedure: CATARACT EXTRACTION PHACO AND INTRAOCULAR LENS PLACEMENT ([place]);  Surgeon: Williams Che, MD;  Location: [facility];  Service: Ophthalmology;  Laterality: Right;  CDE 1.47   CATARACT EXTRACTION W/PHACO Left 07/20/2014   Procedure: CATARACT EXTRACTION WITH PHACO AND INTRAOCULAR LENS PLACEMENT LEFT EYE CDE=1.79;  Surgeon: Williams Che, MD;  Location: [facility];  Service: Ophthalmology;  Laterality: Left;   CERVICAL SPINE SURGERY  2012   8 screws   CORONARY  ATHERECTOMY N/A 03/08/2022   Procedure: CORONARY ATHERECTOMY;  Surgeon: Sherren Mocha, MD;  Location: [place] [facility];  Service: Cardiovascular;  Laterality: N/A;   CYSTOSCOPY WITH INJECTION N/A 06/03/2013   Procedure: MACROPLASTIQUE  INJECTION ;  Surgeon: Malka So, MD;  Location: [facility];  Service: Urology;  Laterality: N/A;   ESOPHAGEAL DILATION Right 06/02/2022   Procedure: ESOPHAGEAL DILATION;  Surgeon: Harvel Quale, MD;  [facility];  Service: Gastroenterology;  Laterality: Right;   ESOPHAGOGASTRODUODENOSCOPY (EGD) WITH PROPOFOL N/A 06/02/2022   Procedure: ESOPHAGOGASTRODUODENOSCOPY (EGD) WITH PROPOFOL;  Surgeon: Harvel Quale, MD;  [facility];  Service: Gastroenterology;  Laterality: N/A;  TO BE DONE IN OR 2   FISTULOGRAM Right 10/20/2016   Procedure: FISTULOGRAM WITH POSSIBLE INTERVENTION;  Surgeon: [name], MD;  Location: [facility];  Service: Vascular;  Laterality: Right;   GIVENS CAPSULE STUDY N/A 06/11/2022   Procedure: GIVENS CAPSULE STUDY;  Surgeon: Clarene Essex, MD;  Location: [place];  Service: Gastroenterology;  Laterality: N/A;   HEMOSTASIS CLIP PLACEMENT  06/02/2022   Procedure: HEMOSTASIS CLIP PLACEMENT;  Surgeon: Harvel Quale, MD;  [facility];  Service: Gastroenterology;;   HOT HEMOSTASIS  06/02/2022   Procedure: HOT HEMOSTASIS (ARGON PLASMA COAGULATION/BICAP);  Surgeon: Montez Morita, Quillian Quince, MD;  [facility];  Service: Gastroenterology;;   INSERTION OF DIALYSIS CATHETER N/A 11/26/2015   Procedure: INSERTION OF DIALYSIS CATHETER;  Surgeon: Angelia Mould, MD;  Location: [place];  Service: Vascular;  Laterality: N/A;   INSERTION OF DIALYSIS CATHETER Right 08/15/2022   Procedure: INSERTION OF TUNNELED DIALYSIS CATHETER;  Surgeon: Rosetta Posner, MD;  Location: [facility];  Service: Vascular;  Laterality: Right;   INTRAVASCULAR IMAGING/OCT N/A 03/08/2022   Procedure: INTRAVASCULAR  IMAGING/OCT;  Surgeon: Sherren Mocha, MD;  Location: [place] [facility];  Service: Cardiovascular;  Laterality: N/A;   LAMINECTOMY  12/29/2011   LEFT HEART CATH AND CORONARY ANGIOGRAPHY N/A 03/06/2022   Procedure: LEFT HEART CATH AND CORONARY ANGIOGRAPHY;  Surgeon: Nelva Bush, MD;  Location: [place] [facility];  Service: Cardiovascular;  Laterality: N/A;   LUMBAR LAMINECTOMY/DECOMPRESSION MICRODISCECTOMY  12/29/2011   Procedure: LUMBAR LAMINECTOMY/DECOMPRESSION MICRODISCECTOMY;  Surgeon: Floyce Stakes, MD;  Location: [place] [facility];  Service: Neurosurgery;  Laterality: N/A;  Lumbar Two through Lumbar Five Laminectomies/Cellsaver   PARTIAL HYSTERECTOMY     PERIPHERAL VASCULAR BALLOON ANGIOPLASTY  03/03/2020   Procedure: PERIPHERAL VASCULAR BALLOON ANGIOPLASTY;  Surgeon: Marty Heck, MD;  Location: [place] [facility];  Service: Cardiovascular;;  Rt arm fistula   PERIPHERAL VASCULAR BALLOON ANGIOPLASTY Right 04/15/2021   Procedure: PERIPHERAL VASCULAR BALLOON ANGIOPLASTY;  Surgeon: Angelia Mould, MD;  Location: [place] [facility];  Service: Cardiovascular;  Laterality: Right;  arm fistula   PERIPHERAL VASCULAR BALLOON ANGIOPLASTY  03/14/2022   Procedure: PERIPHERAL VASCULAR BALLOON ANGIOPLASTY;  Surgeon: Broadus John, MD;  Location: [place] [facility];  Service: Cardiovascular;;  right arm AVF   PERIPHERAL VASCULAR CATHETERIZATION N/A 11/22/2015   Procedure: Fistulagram;  Surgeon: Angelia Mould, MD;  Location: [place] [facility];  Service: Cardiovascular;  Laterality: N/A;   PERIPHERAL VASCULAR INTERVENTION  08/10/2017   Procedure: PERIPHERAL VASCULAR INTERVENTION;  Surgeon: Elam Dutch, MD;  Location: [place] [facility];  Service: Cardiovascular;;   SHOULDER SURGERY Right    Rotator cuff    Family Hx:  Family History  Problem Relation Age of Onset   Heart disease Other    Hypertension Father    Cancer Father    Hypertension Mother    Hypertension Daughter     Pancreatitis Brother    Hypertension Daughter    Diabetes Daughter     Social History:  reports that she quit smoking about 20 years ago. Her smoking use included cigarettes. She has a 30.00 pack-year smoking history. She has never been exposed to tobacco smoke. She has never used smokeless tobacco. She reports that she does not drink alcohol and does not use drugs.  Allergies:  Allergies  Allergen Reactions   Azithromycin     Prolonged QT   Ace Inhibitors Cough   Clopidogrel Bisulfate Other (See Comments)    Sick, Headache, "Felt terrible"   Codeine Other (See Comments)    hallucinations   Lisinopril Cough   Penicillins Other (See Comments)    Unknown childhood reaction Did it involve swelling of the face/tongue/throat, SOB, or low BP? Unknown Did it involve sudden or severe rash/hives, skin peeling, or any reaction on the inside of your mouth or nose? Unknown Did you need to seek medical attention at a hospital or doctor's office? Unknown When did it last happen?      childhood allergy If all above answers are "NO", may proceed with cephalosporin use.      Medications: Prior to Admission medications   Medication Sig Start Date End Date Taking? Authorizing Provider  acetaminophen (TYLENOL) 500 MG tablet Take 1,000 mg by mouth every 6 (six) hours as needed for moderate pain or headache.   Yes [provider]  albuterol (PROVENTIL) (2.5 MG/3ML) 0.083% nebulizer solution Take 3 mLs (2.5 mg total) by nebulization every 6 (six) hours as needed for wheezing or shortness of breath. 12/02/21  Yes [name], MD  amiodarone (PACERONE) 200 MG tablet Take 1 tablet by mouth twice a day for 2 weeks then reduce to 1 tablet daily Patient taking differently: Take 200 mg by mouth 2 (two) times daily. 09/06/22  Yes Fenton, Clint R, PA  apixaban (ELIQUIS) 5 MG TABS tablet Take 1 tablet (5 mg total) by mouth 2 (two) times daily. 04/05/22  Yes Minus Breeding, MD  aspirin EC 81 MG  tablet Take 1 tablet (81 mg total) by mouth daily. Swallow whole. 06/15/22  Yes Vashti Hey, MD  atenolol (TENORMIN) 100 MG tablet Take 0.5 tablets (50 mg total) by mouth at bedtime. 08/02/22  Yes Sherran Needs, NP  budesonide-formoterol [facility]) 160-4.5 MCG/ACT inhaler Inhale 2 puffs into the lungs 2 (two) times daily. Patient taking differently: Inhale 2 puffs into the lungs 2 (two) times daily as needed (shortness of breath). 05/03/22  Yes [name], MD  Carboxymethylcellul-Glycerin (LUBRICATING EYE DROPS OP) Place 1 drop into both eyes  daily as needed (dry eyes).   Yes [provider]  cetirizine (ZYRTEC) 10 MG tablet Take 1 tablet (10 mg total) by mouth daily as needed for allergies. Patient taking differently: Take 10 mg by mouth daily. 06/15/22  Yes Vashti Hey, MD  diltiazem (CARDIZEM CD) 240 MG 24 hr capsule Take 1 capsule (240 mg total) by mouth daily. 05/17/22  Yes Minus Breeding, MD  DULoxetine (CYMBALTA) 30 MG capsule TAKE 1 CAPSULE BY MOUTH ONCE DAILY WITH SUPPER Patient taking differently: Take 30 mg by mouth every evening. 09/04/22  Yes [name], MD  Ferrous Fumarate (HEMOCYTE - 106 MG FE) 324 (106 Fe) MG TABS tablet Take 1 tablet (106 mg of iron total) by mouth daily. 06/28/22  Yes [name], [name], MD  insulin aspart (NOVOLOG FLEXPEN) 100 UNIT/ML FlexPen Inject 1 Units into the skin 3 (three) times daily with meals. < 150: 0 units. 150-199:1 unit. 200-249: 2 units. 250-299: 3 units. 300-349: 4 units. Over 350: 5 units 05/19/22  Yes Rakes, Connye Burkitt, FNP  insulin degludec (TRESIBA FLEXTOUCH) 100 UNIT/ML FlexTouch Pen Inject 20 Units into the skin at bedtime. Via novo nordisk patient assistance program Patient taking differently: Inject 25 Units into the skin at bedtime. Via novo nordisk patient assistance program 05/03/22  Yes [name], [name], MD  multivitamin (RENA-VIT) TABS tablet Take 1 tablet by mouth daily. 05/13/22  Yes [provider]  nitroGLYCERIN (NITROSTAT) 0.4 MG SL tablet Place 1 tablet (0.4 mg total) under the tongue every 5 (five) minutes as needed for chest pain. 11/09/21  Yes Minus Breeding, MD  ondansetron (ZOFRAN) 4 MG tablet TAKE 1 TABLET BY MOUTH EVERY 8 HOURS AS NEEDED FOR NAUSEA AND VOMITING Patient taking differently: Take 4 mg by mouth every 8 (eight) hours as needed for nausea or vomiting. TAKE 1 TABLET BY MOUTH EVERY 8 HOURS AS NEEDED FOR NAUSEA AND VOMITING 07/03/22  Yes [name], [name], MD  rOPINIRole (REQUIP) 1 MG tablet Take 1 mg by mouth at bedtime.   Yes [provider]  Semaglutide,0.25 or 0.5MG /DOS, (OZEMPIC, 0.25 OR 0.5 MG/DOSE,) 2 MG/1.5ML SOPN Inject 0.5 mg into the skin every 7 (seven) days. Saturday   Yes [provider]  torsemide (DEMADEX) 20 MG tablet Take 20 mg by mouth daily. 07/02/22  Yes [provider]  Continuous Blood Gluc Receiver (FREESTYLE LIBRE 2 READER) DEVI USE TO TEST BLOOD SUGAR CONTINUOUSLY 11/15/21   [name], MD  Continuous Blood Gluc Sensor (FREESTYLE LIBRE 2 SENSOR) MISC Use multiple times daily to track glucose to prevent highs and lows as a complication of dialysis Dx:Z99.2, E11.59 09/15/21   [name], MD  gabapentin (NEURONTIN) 400 MG capsule TAKE 2 CAPSULES AFTER EACH DIALYSIS SESSION Patient taking differently: Take 800 mg by mouth See admin instructions. Take 800 mg by mouth Tuesday, Thursday and Saturday after dialysis 07/27/22   [name], MD  glucose blood (ONETOUCH ULTRA) test strip Use to test blood sugar 3 times daily as directed. DX: E11.9 09/13/20   [name], MD  lanthanum (FOSRENOL) 750 MG chewable tablet SMARTSIG:2 Tablet(s) By Mouth Patient not taking: Reported on 09/29/2022 09/21/22   [provider]  polyethylene glycol (MIRALAX / GLYCOLAX) 17 g packet Take 17 g by mouth daily as needed for moderate constipation or mild constipation. 03/10/22   Pokhrel, Corrie Mckusick, MD  rosuvastatin (CRESTOR) 5  MG tablet Take 1 tablet (5 mg total) by mouth daily. For cholesterol Patient not taking: Reported on 09/29/2022 12/12/21   [name],  [name], MD    I have reviewed the patient's current and reported prior to admission medications.  Labs:     Latest Ref Rng & Units 09/30/2022    4:03 AM 09/29/2022    5:28 PM 08/15/2022    7:16 AM  BMP  Glucose 70 - 99 mg/dL 179  193  171   BUN 8 - 23 mg/dL 21  15  29    Creatinine 0.44 - 1.00 mg/dL 7.82  7.35  7.04   Sodium 135 - 145 mmol/L 139  139  137   Potassium 3.5 - 5.1 mmol/L 3.3  3.4  3.6   Chloride 98 - 111 mmol/L 98  99  93   CO2 22 - 32 mmol/L 24  25  31    Calcium 8.9 - 10.3 mg/dL 9.9  9.9  9.2      ROS:  Pertinent items noted in HPI and remainder of comprehensive ROS otherwise negative.  Physical Exam: Vitals:   10/01/22 1625 10/01/22 1700  BP:  (!) 168/56  Pulse: (!) 117 (!) 36  Resp: 17 (!) 21  Temp: 98.4 F (36.9 C)   SpO2: 98% 94%     General:  adult female in bed in NAD    HEENT: NCAT Eyes: EOMI sclera anicteric Neck: supple trachea midline  Heart: S1S2 no rub Lungs: wheezing noted; unlabored at rest; on oxygen  Abdomen: soft/nt/obese habitus  Extremities: no edema appreciated ; no cyanosis or clubbing Skin: no rash on extremities exposed Neuro: alert and oriented x 3 provides hx and follows commands Psych normal mood and affect Access: RUE AVG with bruit  Assessment/Plan:  Acute on Chronic diastolic CHF - optimize volume status with HD   ESRD  - HD per TTS schedule  - renal panel in am  - would obtain outpatient HD orders tomorrow  COPD exacerbation  - per primary team  - on steroids and has nebs scheduled and PRN - I spoke with RN and she can have one soon  Chest pain  - Hx of CAD - Per primary team   HTN  - Optimize volume status with HD  - Continue current regimen   A fib  - noted - pt on dilt and amiodarone   Anemia CKD  - Hb 11.5 - acceptable   Metabolic bone disease  - will need to obtain  outpatient HD orders tomorrow - hyperphos - resume home fosrenol   Disposition - per primary team.  Ok for floor from my standpoint   Claudia Desanctis 10/01/2022, 6:39 PM

## 2022-10-01 NOTE — Progress Notes (Addendum)
Pharmacy Antibiotic Note  [name] is a 71 y.o. female admitted on 09/29/2022 with  dialysis graft infection . Patient ESRD T-Th-Sat and started on vancomycin and ceftazidime on 10/24 for the next 2 weeks Pharmacy has been consulted to continue this dosing.  Plan: Vancomycin 1000 mg IV every T-Th-Sat w/ HD Ceftazidime 1000 mg IV every T-Th-Sat w/ HD Monitor labs, c/s, and vanco level as indicated.  Height: 5\' 3"  (160 cm) Weight: 80.9 kg (178 lb 5.6 oz) IBW/kg (Calculated) : 52.4  Temp (24hrs), Avg:98.4 F (36.9 C), Min:97.8 F (36.6 C), Max:98.9 F (37.2 C)  Recent Labs  Lab 09/29/22 1728 09/30/22 0403  WBC 15.6* 14.2*  CREATININE 7.35* 7.82*    Estimated Creatinine Clearance: 6.7 mL/min (A) (by C-G formula based on SCr of 7.82 mg/dL (H)).    Allergies  Allergen Reactions   Azithromycin     Prolonged QT   Ace Inhibitors Cough   Clopidogrel Bisulfate Other (See Comments)    Sick, Headache, "Felt terrible"   Codeine Other (See Comments)    hallucinations   Lisinopril Cough   Penicillins Other (See Comments)    Unknown childhood reaction Did it involve swelling of the face/tongue/throat, SOB, or low BP? Unknown Did it involve sudden or severe rash/hives, skin peeling, or any reaction on the inside of your mouth or nose? Unknown Did you need to seek medical attention at a hospital or doctor's office? Unknown When did it last happen?      childhood allergy If all above answers are "NO", may proceed with cephalosporin use.      Antimicrobials this admission: Vanco 10/24 >> Ceftazidime 10/24 >>     Microbiology results: 10/27 Sputum: pending- gram neg rods, gram + cocci  10/27 MRSA PCR: positive  Thank you for allowing pharmacy to be a part of this patient's care.  Ramond Craver 10/01/2022 9:14 AM

## 2022-10-01 NOTE — Progress Notes (Signed)
PROGRESS NOTE    [name]  OZD:[id number] DOB: [birth year] DOA: 09/29/2022 PCP: [name], MD    Brief Narrative:  71 year old female with a history of end-stage renal disease on hemodialysis, diastolic heart failure, atrial fibrillation, admitted to the hospital with shortness of breath.  Found to be in volume overload.  Nephrology consulted for dialysis needs.   Assessment & Plan:   Principal Problem:   CHF exacerbation ([place]) Active Problems:   ESRD (end stage renal disease) on dialysis ([place])   Type 2 diabetes mellitus with hyperlipidemia (HCC)   Coronary artery disease of native artery of native heart with stable angina pectoris (HCC)   Prolonged Q-T interval on ECG   Anemia of chronic disease/ESRD   Leukocytosis   Hypokalemia   Dyslipidemia   Acute respiratory failure with hypoxia (HCC)   Paroxysmal atrial fibrillation (HCC)   Volume overload   ESRD on HD -T-Th-Sat at [facility] -patient reports that last treatment was on Thursday and was full treatment -she reports compliance with her salt/fluid intake -Nephrology following -Patient had HD treatment on 10/28 with removal of 3.5 L -Follow-up chest x-ray does show some improvement in congestion   Acute on chronic diastolic CHF -volume status managed with HD -continue BB, ASA, statin   Insulin dependent DM type 2 -continue basal insulin -sliding scale insulin -Blood sugars currently stable   Acute on chronic respiratory failure with hypoxia  -related to pulmonary edema -normally wears oxygen only at night -briefly required bipap on admission for respiratory distress -currently on 2 L of oxygen during the day -try and wean oxygen as with volume removal   COPD -Noted to have bilateral wheezing -continue bronchodilators -continue dulera -Start short course of Solu-Medrol   Pulmonary hypertension -felt to be secondary to COPD   PAF -continue diltiazem, amiodarone, atenolol and eliquis    Dialysis graft infection -Informed by patient that she was recently started on IV antibiotics with dialysis for dialysis graft infection -Since she is followed at [facility], I was able to reach out to one of the nephrologists from her primary nephrologist group and reviewed her records.  Per records, there was concern that she had an infection in her dialysis graft due to bloody drainage from this area and she was started on vancomycin and Fortaz on 10/24 to be taken after dialysis for the next 2 weeks.  She has an appointment to see her vascular surgeon on 11/1.  We will continue vancomycin and Tressie Ellis here in the hospital so as not to interrupt her antibiotic course.     DVT prophylaxis: SCDs Start: 09/29/22 2328 apixaban (ELIQUIS) tablet 5 mg  Code Status: Full code Family Communication: Discussed with patient Disposition Plan: Status is: Inpatient Remains inpatient appropriate because: Continued management of respiratory issues including volume overload and COPD     Consultants:  Nephrology  Procedures:    Antimicrobials:  Vancomycin and Tressie Ellis, continued from outpatient course   Subjective: Overall feels her breathing is better, but she does have a cough and has difficulty expectorating sputum.  She also notices significant wheeze.  Objective: Vitals:   10/01/22 1150 10/01/22 1200 10/01/22 1620 10/01/22 1625  BP:  (!) 153/63    Pulse:  67    Resp:  16    Temp: 97.9 F (36.6 C)   98.4 F (36.9 C)  TempSrc: Oral  (P) Oral Oral  SpO2:  97%    Weight:      Height:  Intake/Output Summary (Last 24 hours) at 10/01/2022 1702 Last data filed at 10/01/2022 1238 Gross per 24 hour  Intake 1040 ml  Output 3500 ml  Net -2460 ml   Filed Weights   09/29/22 2338 09/30/22 0500 09/30/22 1450  Weight: 80.8 kg 80.8 kg 80.9 kg    Examination:  General exam: Appears calm and comfortable  Respiratory system: Overall crackles are better bilaterally, but she does have  bilateral wheeze Cardiovascular system: S1 & S2 heard, RRR. No JVD, murmurs, rubs, gallops or clicks. No pedal edema. Gastrointestinal system: Abdomen is nondistended, soft and nontender. No organomegaly or masses felt. Normal bowel sounds heard. Central nervous system: Alert and oriented. No focal neurological deficits. Extremities: She is noted to have more swelling in her right upper extremity than her left upper extremity Skin: No rashes, lesions or ulcers Psychiatry: Judgement and insight appear normal. Mood & affect appropriate.     Data Reviewed: I have personally reviewed following labs and imaging studies  CBC: Recent Labs  Lab 09/29/22 1728 09/30/22 0403  WBC 15.6* 14.2*  NEUTROABS  --  10.5*  HGB 11.7* 11.5*  HCT 36.7 36.5  MCV 87.6 88.6  PLT 249 696   Basic Metabolic Panel: Recent Labs  Lab 09/29/22 1728 09/30/22 0403  NA 139 139  K 3.4* 3.3*  CL 99 98  CO2 25 24  GLUCOSE 193* 179*  BUN 15 21  CREATININE 7.35* 7.82*  CALCIUM 9.9 9.9  MG  --  2.3  PHOS  --  7.6*   GFR: Estimated Creatinine Clearance: 6.7 mL/min (A) (by C-G formula based on SCr of 7.82 mg/dL (H)). Liver Function Tests: Recent Labs  Lab 09/30/22 0403  AST 31  ALT 26  ALKPHOS 63  BILITOT 1.0  PROT 6.8  ALBUMIN 3.7   No results for input(s): "LIPASE", "AMYLASE" in the last 168 hours. No results for input(s): "AMMONIA" in the last 168 hours. Coagulation Profile: No results for input(s): "INR", "PROTIME" in the last 168 hours. Cardiac Enzymes: No results for input(s): "CKTOTAL", "CKMB", "CKMBINDEX", "TROPONINI" in the last 168 hours. BNP (last 3 results) No results for input(s): "PROBNP" in the last 8760 hours. HbA1C: Recent Labs    09/30/22 0403  HGBA1C 6.7*   CBG: Recent Labs  Lab 09/30/22 1607 09/30/22 2142 10/01/22 0730 10/01/22 1131 10/01/22 1623  GLUCAP 163* 235* 83 207* 166*   Lipid Profile: No results for input(s): "CHOL", "HDL", "LDLCALC", "TRIG", "CHOLHDL",  "LDLDIRECT" in the last 72 hours. Thyroid Function Tests: No results for input(s): "TSH", "T4TOTAL", "FREET4", "T3FREE", "THYROIDAB" in the last 72 hours. Anemia Panel: No results for input(s): "VITAMINB12", "FOLATE", "FERRITIN", "TIBC", "IRON", "RETICCTPCT" in the last 72 hours. Sepsis Labs: Recent Labs  Lab 09/29/22 1935 09/30/22 0403 10/01/22 0327  PROCALCITON 0.30 0.58 0.67    Recent Results (from the past 240 hour(s))  Resp Panel by RT-PCR (Flu A&B, Covid) Anterior Nasal Swab     Status: None   Collection Time: 09/29/22  5:55 PM   Specimen: Anterior Nasal Swab  Result Value Ref Range Status   SARS Coronavirus 2 by RT PCR NEGATIVE NEGATIVE Final    Comment: (NOTE) SARS-CoV-2 target nucleic acids are NOT DETECTED.  The SARS-CoV-2 RNA is generally detectable in upper respiratory specimens during the acute phase of infection. The lowest concentration of SARS-CoV-2 viral copies this assay can detect is 138 copies/mL. A negative result does not preclude SARS-Cov-2 infection and should not be used as the sole basis for treatment or  other patient management decisions. A negative result may occur with  improper specimen collection/handling, submission of specimen other than nasopharyngeal swab, presence of viral mutation(s) within the areas targeted by this assay, and inadequate number of viral copies(<138 copies/mL). A negative result must be combined with clinical observations, patient history, and epidemiological information. The expected result is Negative.  Fact Sheet for Patients:  EntrepreneurPulse.com.au  Fact Sheet for Healthcare Providers:  IncredibleEmployment.be  This test is no t yet approved or cleared by the [place] FDA and  has been authorized for detection and/or diagnosis of SARS-CoV-2 by FDA under an Emergency Use Authorization (EUA). This EUA will remain  in effect (meaning this test can be used) for the duration of  the COVID-19 declaration under Section 564(b)(1) of the Act, 21 U.S.C.section 360bbb-3(b)(1), unless the authorization is terminated  or revoked sooner.       Influenza A by PCR NEGATIVE NEGATIVE Final   Influenza B by PCR NEGATIVE NEGATIVE Final    Comment: (NOTE) The Xpert Xpress SARS-CoV-2/FLU/RSV plus assay is intended as an aid in the diagnosis of influenza from Nasopharyngeal swab specimens and should not be used as a sole basis for treatment. Nasal washings and aspirates are unacceptable for Xpert Xpress SARS-CoV-2/FLU/RSV testing.  Fact Sheet for Patients: EntrepreneurPulse.com.au  Fact Sheet for Healthcare Providers: IncredibleEmployment.be  This test is not yet approved or cleared by the [place] FDA and has been authorized for detection and/or diagnosis of SARS-CoV-2 by FDA under an Emergency Use Authorization (EUA). This EUA will remain in effect (meaning this test can be used) for the duration of the COVID-19 declaration under Section 564(b)(1) of the Act, 21 U.S.C. section 360bbb-3(b)(1), unless the authorization is terminated or revoked.  Performed at [facility], [address] [place], [place] [address]   Culture, Respiratory w Gram Stain     Status: None (Preliminary result)   Collection Time: 09/29/22  7:55 PM   Specimen: SPU  Result Value Ref Range Status   Specimen Description   Final    SPUTUM Performed at [facility], [address] [place], [place] [address]    Special Requests   Final    NONE Performed at [facility], [address], [place], [place] [address]    Gram Stain   Final    FEW WBC PRESENT, PREDOMINANTLY MONONUCLEAR FEW GRAM NEGATIVE RODS FEW GRAM POSITIVE COCCI IN PAIRS FEW SQUAMOUS EPITHELIAL CELLS PRESENT    Culture   Final    CULTURE REINCUBATED FOR BETTER GROWTH Performed at [facility], [place] [address] [place], [place] [address]    Report Status PENDING  Incomplete  MRSA  Next Gen by PCR, Nasal     Status: Abnormal   Collection Time: 09/29/22 11:29 PM   Specimen: Nasal Mucosa; Nasal Swab  Result Value Ref Range Status   MRSA by PCR Next Gen DETECTED (A) NOT DETECTED Final    Comment: RESULT CALLED TO, READ BACK BY AND VERIFIED WITH: Schrieber L @ [id number] ON [id number] BY HENDERSON L (NOTE) The GeneXpert MRSA Assay (FDA approved for NASAL specimens only), is one component of a comprehensive MRSA colonization surveillance program. It is not intended to diagnose MRSA infection nor to guide or monitor treatment for MRSA infections. Test performance is not FDA approved in patients less than 70 years old. Performed at [facility], [address] [place], [place] [address]          Radiology Studies: DG CHEST PORT 1 VIEW  Result Date: 10/01/2022 CLINICAL DATA:  Shortness of breath. EXAM: PORTABLE CHEST 1 VIEW COMPARISON:  Chest x-ray 09/29/2022 FINDINGS: Right-sided central venous catheter tip projects over the cavoatrial junction, unchanged. The heart is enlarged, unchanged. There is central pulmonary vascular congestion. There is no focal lung consolidation, pleural effusion or pneumothorax. No acute fractures are seen. Cervical spinal fusion plate is again noted. IMPRESSION: Cardiomegaly with central pulmonary vascular congestion. Electronically Signed   By: Ronney Asters M.D.   On: 10/01/2022 16:28   ECHOCARDIOGRAM COMPLETE  Result Date: 09/30/2022    ECHOCARDIOGRAM REPORT   Patient Name:   LUCINDIA LEMLEY Date of Exam: 09/30/2022 Medical Rec #:  [id number]       Height:       63.0 in Accession #:    [id number]      Weight:       178.1 lb Date of Birth:  [birth year]       BSA:          1.841 m Patient Age:    29 years        BP:           164/50 mmHg Patient Gender: F               HR:           82 bpm. Exam Location:  [facility] Procedure: 2D Echo, Cardiac Doppler and Color Doppler Indications:    Congestive Heart Failure I50.9  History:        Patient has prior  history of Echocardiogram examinations, most                 recent 03/05/2022. CHF, CAD and Previous Myocardial Infarction,                 COPD, Arrythmias:Atrial Fibrillation; Risk Factors:Hypertension,                 Diabetes and Dyslipidemia. ESRD (end stage renal disease) on                 dialysis.  Sonographer:    Alvino Chapel RCS Referring Phys: [id number] ASIA B [place]  1. Left ventricular ejection fraction, by estimation, is 55 to 60%. The left ventricle has normal function. Left ventricular endocardial border not optimally defined to evaluate regional wall motion. There is moderate asymmetric left ventricular hypertrophy of the lateral segment. Left ventricular diastolic parameters are indeterminate. There is the interventricular septum is flattened in diastole ('D' shaped left ventricle), consistent with right ventricular volume overload and the interventricular septum is flattened in systole, consistent with right ventricular pressure overload.  2. Right ventricular systolic function is mildly reduced. The right ventricular size is normal. There is mildly elevated pulmonary artery systolic pressure. The estimated right ventricular systolic pressure is 85.2 mmHg.  3. Left atrial size was moderately dilated.  4. There is no evidence of cardiac tamponade.  5. The mitral valve is degenerative. Mild mitral valve regurgitation. No evidence of mitral stenosis.  6. The aortic valve is tricuspid. There is mild calcification of the aortic valve. Aortic valve regurgitation is not visualized. Aortic valve sclerosis is present, with no evidence of aortic valve stenosis. Comparison(s): Slight increase in RVSP from 2022. FINDINGS  Left Ventricle: Left ventricular ejection fraction, by estimation, is 55 to 60%. The left ventricle has normal function. Left ventricular endocardial border not optimally defined to evaluate regional wall motion. The left ventricular internal cavity size was normal in size.  There is moderate asymmetric left ventricular hypertrophy of the lateral segment. The interventricular septum is flattened in diastole ('D' shaped left ventricle), consistent with right ventricular volume overload and the interventricular septum is flattened in systole, consistent with right ventricular pressure overload. Left ventricular diastolic parameters are indeterminate. Right Ventricle: The right ventricular size is normal. No increase in right ventricular wall thickness. Right ventricular systolic function is mildly reduced. There is mildly elevated pulmonary artery systolic pressure. The tricuspid regurgitant velocity  is 2.66 m/s, and with an assumed right atrial pressure of 15 mmHg, the estimated right ventricular systolic pressure is 79.8 mmHg. Left Atrium: Left atrial size was moderately dilated. Right Atrium: Right atrial size was normal in size. Pericardium: Trivial pericardial effusion is present. The pericardial effusion is circumferential. There is no evidence of cardiac tamponade. Mitral Valve: The mitral valve is degenerative in appearance. Mild mitral valve regurgitation. No evidence of mitral valve stenosis. Tricuspid Valve: The tricuspid valve is normal in structure. Tricuspid valve regurgitation is mild . No evidence of tricuspid stenosis. Aortic Valve: The aortic valve is tricuspid. There is mild calcification of the aortic valve. There is mild aortic valve annular calcification. Aortic valve regurgitation is not visualized. Aortic valve sclerosis is present, with no evidence of aortic valve stenosis. Pulmonic Valve: The pulmonic valve was normal in structure. Pulmonic valve regurgitation is not visualized. No evidence of pulmonic stenosis. Aorta: The aortic root is normal in size and structure. IAS/Shunts: No atrial level shunt detected by color flow Doppler.  LEFT VENTRICLE PLAX 2D LVIDd:         4.50 cm LVIDs:         2.90 cm LV PW:         1.50 cm LV IVS:        1.20 cm LVOT diam:      1.50 cm LV SV:         29 LV SV Index:   16 LVOT Area:     1.77 cm  RIGHT VENTRICLE TAPSE (M-mode): 1.3 cm LEFT ATRIUM             Index        RIGHT ATRIUM           Index LA diam:        4.70 cm 2.55 cm/m   RA Area:     20.70 cm LA Vol (A2C):   86.9 ml 47.21 ml/m  RA Volume:   57.70 ml  31.35 ml/m LA Vol (A4C):   68.0 ml 36.94 ml/m LA Biplane Vol: 77.1 ml 41.88 ml/m  AORTIC VALVE LVOT Vmax:   93.63 cm/s LVOT Vmean:  58.233 cm/s LVOT VTI:    0.166 m  AORTA Ao Root diam: 3.00 cm MITRAL VALVE                TRICUSPID VALVE MV Area (PHT): 4.80 cm     TR Peak grad:   28.3 mmHg MV Decel Time: 158 msec     TR Vmax:        266.00 cm/s MV E velocity: 140.00 cm/s MV A velocity: 45.10 cm/s   SHUNTS MV E/A ratio:  3.10         Systemic VTI:  0.17 m                             Systemic Diam: 1.50 cm Rudean Haskell MD Electronically signed by Rudean Haskell MD Signature Date/Time:  09/30/2022/1:37:40 PM    Final    DG Chest 2 View  Result Date: 09/29/2022 CLINICAL DATA:  Chest pain EXAM: CHEST - 2 VIEW COMPARISON:  Chest x-ray dated August 15, 2022; chest CT dated May 15, 2022 FINDINGS: Unchanged cardiomegaly. Mild diffuse bilateral interstitial opacities. Linear opacity of the right lower lobe, likely due to atelectasis. Indeterminate focal nodular opacities seen in the right lower lobe, new when compared with prior chest x-ray. No large pleural effusion or pneumothorax. Unchanged position of right-sided dialysis catheter. IMPRESSION: 1. Mild diffuse bilateral interstitial opacities, likely due to pulmonary edema. 2. Indeterminate focal nodular opacity seen in the right lower lobe, possibly atelectasis, although given nodules were present on prior chest CT dated May 15, 2022. Recommend further evaluation with repeat chest CT to exclude enlargement. Electronically Signed   By: Yetta Glassman M.D.   On: 09/29/2022 17:53        Scheduled Meds:  amiodarone  200 mg Oral BID   apixaban  5 mg  Oral BID   arformoterol  15 mcg Nebulization BID   aspirin EC  81 mg Oral Daily   atenolol  50 mg Oral QHS   budesonide (PULMICORT) nebulizer solution  0.25 mg Nebulization BID   Chlorhexidine Gluconate Cloth  6 each Topical Daily   Chlorhexidine Gluconate Cloth  6 each Topical Q0600   diltiazem  240 mg Oral Daily   DULoxetine  30 mg Oral QPM   insulin aspart  0-15 Units Subcutaneous TID WC   insulin aspart  0-5 Units Subcutaneous QHS   insulin detemir  20 Units Subcutaneous QHS   ipratropium-albuterol  3 mL Nebulization Q6H WA   Living Better with Heart Failure Book   Does not apply Once   methylPREDNISolone (SOLU-MEDROL) injection  40 mg Intravenous Q12H   mupirocin ointment   Nasal BID   rosuvastatin  5 mg Oral Daily   Continuous Infusions:  [START ON 10/03/2022] cefTAZidime (FORTAZ)  IV     vancomycin 1,000 mg (10/01/22 1238)     LOS: 2 days    Time spent: 57mins    Kathie Dike, MD [facility]   If 7PM-7AM, please contact night-coverage [email or web address]  10/01/2022, 5:02 PM

## 2022-10-02 ENCOUNTER — Inpatient Hospital Stay (HOSPITAL_COMMUNITY): Payer: Medicare Other

## 2022-10-02 DIAGNOSIS — I5033 Acute on chronic diastolic (congestive) heart failure: Secondary | ICD-10-CM | POA: Diagnosis not present

## 2022-10-02 DIAGNOSIS — N186 End stage renal disease: Secondary | ICD-10-CM | POA: Diagnosis not present

## 2022-10-02 DIAGNOSIS — J9601 Acute respiratory failure with hypoxia: Secondary | ICD-10-CM | POA: Diagnosis not present

## 2022-10-02 DIAGNOSIS — D638 Anemia in other chronic diseases classified elsewhere: Secondary | ICD-10-CM | POA: Diagnosis not present

## 2022-10-02 LAB — RENAL FUNCTION PANEL
Albumin: 3.6 g/dL (ref 3.5–5.0)
Anion gap: 16 — ABNORMAL HIGH (ref 5–15)
BUN: 34 mg/dL — ABNORMAL HIGH (ref 8–23)
CO2: 20 mmol/L — ABNORMAL LOW (ref 22–32)
Calcium: 9.8 mg/dL (ref 8.9–10.3)
Chloride: 95 mmol/L — ABNORMAL LOW (ref 98–111)
Creatinine, Ser: 8.21 mg/dL — ABNORMAL HIGH (ref 0.44–1.00)
GFR, Estimated: 5 mL/min — ABNORMAL LOW (ref >60)
Glucose, Bld: 303 mg/dL — ABNORMAL HIGH (ref 70–99)
Phosphorus: 5.8 mg/dL — ABNORMAL HIGH (ref 2.5–4.6)
Potassium: 4.1 mmol/L (ref 3.5–5.1)
Sodium: 131 mmol/L — ABNORMAL LOW (ref 135–145)

## 2022-10-02 LAB — CULTURE, RESPIRATORY W GRAM STAIN: Culture: NORMAL

## 2022-10-02 LAB — GLUCOSE, CAPILLARY
Glucose-Capillary: 340 mg/dL — ABNORMAL HIGH (ref 70–99)
Glucose-Capillary: 332 mg/dL — ABNORMAL HIGH (ref 70–99)
Glucose-Capillary: 236 mg/dL — ABNORMAL HIGH (ref 70–99)

## 2022-10-02 MED ORDER — INSULIN ASPART 100 UNIT/ML IJ SOLN
0.0000 [IU] | Freq: Every day | INTRAMUSCULAR | Status: DC
  Administered 2022-10-03: 4 [IU] via SUBCUTANEOUS

## 2022-10-02 MED ORDER — POLYETHYLENE GLYCOL 3350 17 G PO PACK
17.0000 g | PACK | Freq: Every day | ORAL | Status: DC
  Administered 2022-10-02 – 2022-10-04 (×3): 17 g via ORAL
  Filled 2022-10-02 (×3): qty 1

## 2022-10-02 MED ORDER — INSULIN ASPART 100 UNIT/ML IJ SOLN
0.0000 [IU] | Freq: Three times a day (TID) | INTRAMUSCULAR | Status: DC
  Administered 2022-10-02: 7 [IU] via SUBCUTANEOUS
  Administered 2022-10-02: 15 [IU] via SUBCUTANEOUS
  Administered 2022-10-03: 11 [IU] via SUBCUTANEOUS
  Administered 2022-10-03: 4 [IU] via SUBCUTANEOUS
  Administered 2022-10-04: 11 [IU] via SUBCUTANEOUS
  Administered 2022-10-04: 4 [IU] via SUBCUTANEOUS

## 2022-10-02 MED ORDER — PREDNISONE 20 MG PO TABS
40.0000 mg | ORAL_TABLET | Freq: Every day | ORAL | Status: DC
  Administered 2022-10-03 – 2022-10-04 (×2): 40 mg via ORAL
  Filled 2022-10-02 (×2): qty 2

## 2022-10-02 MED ORDER — CHLORHEXIDINE GLUCONATE CLOTH 2 % EX PADS
6.0000 | MEDICATED_PAD | Freq: Every day | CUTANEOUS | Status: DC
  Administered 2022-10-02 – 2022-10-04 (×3): 6 via TOPICAL

## 2022-10-02 MED ORDER — INSULIN ASPART 100 UNIT/ML IJ SOLN
5.0000 [IU] | Freq: Three times a day (TID) | INTRAMUSCULAR | Status: DC
  Administered 2022-10-02 – 2022-10-04 (×6): 5 [IU] via SUBCUTANEOUS

## 2022-10-02 NOTE — Progress Notes (Signed)
PROGRESS NOTE    [name]  XNT:[id number] DOB: [birth year] DOA: 09/29/2022 PCP: [name], MD    Brief Narrative:  71 year old female with a history of end-stage renal disease on hemodialysis, diastolic heart failure, atrial fibrillation, admitted to the hospital with shortness of breath.  Found to be in volume overload.  Nephrology consulted for dialysis needs.   Assessment & Plan:   Principal Problem:   CHF exacerbation ([place]) Active Problems:   ESRD (end stage renal disease) on dialysis ([place])   Type 2 diabetes mellitus with hyperlipidemia (HCC)   Coronary artery disease of native artery of native heart with stable angina pectoris (HCC)   Prolonged Q-T interval on ECG   Anemia of chronic disease/ESRD   Leukocytosis   Hypokalemia   Dyslipidemia   Acute respiratory failure with hypoxia (HCC)   Paroxysmal atrial fibrillation (HCC)   Volume overload   ESRD on HD -T-Th-Sat at [facility] -patient reports that last treatment was on Thursday and was full treatment -she reports compliance with her salt/fluid intake -Nephrology following -Patient had HD treatment on 10/28 with removal of 3.5 L -Follow-up chest x-ray does show some improvement in congestion   Acute on chronic diastolic CHF -volume status managed with HD -next HD session tomorrow -continue BB, ASA, statin   Insulin dependent DM type 2 -continue basal insulin -sliding scale insulin -Blood sugars elevated related to steroids -change SSI to resistant and add meal coverage novolog -tapering steroids   Acute on chronic respiratory failure with hypoxia  -related to pulmonary edema -normally wears oxygen only at night -briefly required bipap on admission for respiratory distress -currently weaned down to RA   COPD -Noted to have bilateral wheezing -continue bronchodilators -continue dulera -as wheezing is improving and she is currently on RA, will change solumedrol to prednisone   Pulmonary  hypertension -felt to be secondary to COPD   PAF -continue diltiazem, amiodarone, atenolol and eliquis   Dialysis graft infection -Informed by patient that she was recently started on IV antibiotics with dialysis for dialysis graft infection -Since she is followed at [facility], I was able to reach out to one of the nephrologists from her primary nephrologist group and reviewed her records.  Per records, there was concern that she had an infection in her dialysis graft due to bloody drainage from this area and she was started on vancomycin and Fortaz on 10/24 to be taken after dialysis for the next 2 weeks.  She has an appointment to see her vascular surgeon on 11/1.  We will continue vancomycin and Tressie Ellis here in the hospital so as not to interrupt her antibiotic course. -nephrology will contact patient's vascular surgeon to see if she can be evaluated while in the hospital, appreciate assistance     DVT prophylaxis: SCDs Start: 09/29/22 2328 apixaban (ELIQUIS) tablet 5 mg  Code Status: Full code Family Communication: Discussed with patient Disposition Plan: Status is: Inpatient Remains inpatient appropriate because: Continued management of respiratory issues including volume overload and COPD     Consultants:  Nephrology  Procedures:    Antimicrobials:  Vancomycin and Tressie Ellis, continued from outpatient course   Subjective: Feels that her breathing may be a little better today. Had some shortness of breath and vomiting overnight  Objective: Vitals:   10/02/22 0800 10/02/22 0900 10/02/22 0929 10/02/22 1000  BP: (!) 177/57 (!) 171/74  (!) 170/51  Pulse: 78 80 75 69  Resp: 14 18 18 15   Temp:  TempSrc:      SpO2: 100% 97% 100% 100%  Weight:      Height:        Intake/Output Summary (Last 24 hours) at 10/02/2022 1023 Last data filed at 10/02/2022 0600 Gross per 24 hour  Intake 860.07 ml  Output 0 ml  Net 860.07 ml   Filed Weights   09/30/22 0500 09/30/22 1450  10/02/22 0439  Weight: 80.8 kg 80.9 kg 79.8 kg    Examination:  General exam: Appears calm and comfortable  Respiratory system: mild wheeze b/l, improving Cardiovascular system: S1 & S2 heard, RRR. No JVD, murmurs, rubs, gallops or clicks. No pedal edema. Gastrointestinal system: Abdomen is nondistended, soft and nontender. No organomegaly or masses felt. Normal bowel sounds heard. Central nervous system: Alert and oriented. No focal neurological deficits. Extremities: She is noted to have more swelling in her right upper extremity than her left upper extremity Skin: No rashes, lesions or ulcers Psychiatry: Judgement and insight appear normal. Mood & affect appropriate.     Data Reviewed: I have personally reviewed following labs and imaging studies  CBC: Recent Labs  Lab 09/29/22 1728 09/30/22 0403  WBC 15.6* 14.2*  NEUTROABS  --  10.5*  HGB 11.7* 11.5*  HCT 36.7 36.5  MCV 87.6 88.6  PLT 249 884   Basic Metabolic Panel: Recent Labs  Lab 09/29/22 1728 09/30/22 0403 10/02/22 0346  NA 139 139 131*  K 3.4* 3.3* 4.1  CL 99 98 95*  CO2 25 24 20*  GLUCOSE 193* 179* 303*  BUN 15 21 34*  CREATININE 7.35* 7.82* 8.21*  CALCIUM 9.9 9.9 9.8  MG  --  2.3  --   PHOS  --  7.6* 5.8*   GFR: Estimated Creatinine Clearance: 6.4 mL/min (A) (by C-G formula based on SCr of 8.21 mg/dL (H)). Liver Function Tests: Recent Labs  Lab 09/30/22 0403 10/02/22 0346  AST 31  --   ALT 26  --   ALKPHOS 63  --   BILITOT 1.0  --   PROT 6.8  --   ALBUMIN 3.7 3.6   No results for input(s): "LIPASE", "AMYLASE" in the last 168 hours. No results for input(s): "AMMONIA" in the last 168 hours. Coagulation Profile: No results for input(s): "INR", "PROTIME" in the last 168 hours. Cardiac Enzymes: No results for input(s): "CKTOTAL", "CKMB", "CKMBINDEX", "TROPONINI" in the last 168 hours. BNP (last 3 results) No results for input(s): "PROBNP" in the last 8760 hours. HbA1C: Recent Labs     09/30/22 0403  HGBA1C 6.7*   CBG: Recent Labs  Lab 10/01/22 0730 10/01/22 1131 10/01/22 1623 10/01/22 2048 10/02/22 0713  GLUCAP 83 207* 166* 373* 340*   Lipid Profile: No results for input(s): "CHOL", "HDL", "LDLCALC", "TRIG", "CHOLHDL", "LDLDIRECT" in the last 72 hours. Thyroid Function Tests: No results for input(s): "TSH", "T4TOTAL", "FREET4", "T3FREE", "THYROIDAB" in the last 72 hours. Anemia Panel: No results for input(s): "VITAMINB12", "FOLATE", "FERRITIN", "TIBC", "IRON", "RETICCTPCT" in the last 72 hours. Sepsis Labs: Recent Labs  Lab 09/29/22 1935 09/30/22 0403 10/01/22 0327  PROCALCITON 0.30 0.58 0.67    Recent Results (from the past 240 hour(s))  Resp Panel by RT-PCR (Flu A&B, Covid) Anterior Nasal Swab     Status: None   Collection Time: 09/29/22  5:55 PM   Specimen: Anterior Nasal Swab  Result Value Ref Range Status   SARS Coronavirus 2 by RT PCR NEGATIVE NEGATIVE Final    Comment: (NOTE) SARS-CoV-2 target nucleic acids are NOT DETECTED.  The SARS-CoV-2 RNA is generally detectable in upper respiratory specimens during the acute phase of infection. The lowest concentration of SARS-CoV-2 viral copies this assay can detect is 138 copies/mL. A negative result does not preclude SARS-Cov-2 infection and should not be used as the sole basis for treatment or other patient management decisions. A negative result may occur with  improper specimen collection/handling, submission of specimen other than nasopharyngeal swab, presence of viral mutation(s) within the areas targeted by this assay, and inadequate number of viral copies(<138 copies/mL). A negative result must be combined with clinical observations, patient history, and epidemiological information. The expected result is Negative.  Fact Sheet for Patients:  EntrepreneurPulse.com.au  Fact Sheet for Healthcare Providers:  IncredibleEmployment.be  This test is no t  yet approved or cleared by the [place] FDA and  has been authorized for detection and/or diagnosis of SARS-CoV-2 by FDA under an Emergency Use Authorization (EUA). This EUA will remain  in effect (meaning this test can be used) for the duration of the COVID-19 declaration under Section 564(b)(1) of the Act, 21 U.S.C.section 360bbb-3(b)(1), unless the authorization is terminated  or revoked sooner.       Influenza A by PCR NEGATIVE NEGATIVE Final   Influenza B by PCR NEGATIVE NEGATIVE Final    Comment: (NOTE) The Xpert Xpress SARS-CoV-2/FLU/RSV plus assay is intended as an aid in the diagnosis of influenza from Nasopharyngeal swab specimens and should not be used as a sole basis for treatment. Nasal washings and aspirates are unacceptable for Xpert Xpress SARS-CoV-2/FLU/RSV testing.  Fact Sheet for Patients: EntrepreneurPulse.com.au  Fact Sheet for Healthcare Providers: IncredibleEmployment.be  This test is not yet approved or cleared by the [place] FDA and has been authorized for detection and/or diagnosis of SARS-CoV-2 by FDA under an Emergency Use Authorization (EUA). This EUA will remain in effect (meaning this test can be used) for the duration of the COVID-19 declaration under Section 564(b)(1) of the Act, 21 U.S.C. section 360bbb-3(b)(1), unless the authorization is terminated or revoked.  Performed at [facility], [address] [place], [place] [address]   Culture, Respiratory w Gram Stain     Status: None (Preliminary result)   Collection Time: 09/29/22  7:55 PM   Specimen: SPU  Result Value Ref Range Status   Specimen Description   Final    SPUTUM Performed at [facility], [address] [place], [place] [address]    Special Requests   Final    NONE Performed at [facility], [address], [place], [place] [address]    Gram Stain   Final    FEW WBC PRESENT, PREDOMINANTLY MONONUCLEAR FEW GRAM NEGATIVE  RODS FEW GRAM POSITIVE COCCI IN PAIRS FEW SQUAMOUS EPITHELIAL CELLS PRESENT    Culture   Final    CULTURE REINCUBATED FOR BETTER GROWTH Performed at [facility], [place] [address], [place], [place] [address]    Report Status PENDING  Incomplete  MRSA Next Gen by PCR, Nasal     Status: Abnormal   Collection Time: 09/29/22 11:29 PM   Specimen: Nasal Mucosa; Nasal Swab  Result Value Ref Range Status   MRSA by PCR Next Gen DETECTED (A) NOT DETECTED Final    Comment: RESULT CALLED TO, READ BACK BY AND VERIFIED WITH: Tye L @ [id number] ON [id number] BY HENDERSON L (NOTE) The GeneXpert MRSA Assay (FDA approved for NASAL specimens only), is one component of a comprehensive MRSA colonization surveillance program. It is not intended to diagnose MRSA infection  nor to guide or monitor treatment for MRSA infections. Test performance is not FDA approved in patients less than 15 years old. Performed at [facility], [address] [place], [place] [address]          Radiology Studies: DG CHEST PORT 1 VIEW  Result Date: 10/01/2022 CLINICAL DATA:  Shortness of breath. EXAM: PORTABLE CHEST 1 VIEW COMPARISON:  Chest x-ray 09/29/2022 FINDINGS: Right-sided central venous catheter tip projects over the cavoatrial junction, unchanged. The heart is enlarged, unchanged. There is central pulmonary vascular congestion. There is no focal lung consolidation, pleural effusion or pneumothorax. No acute fractures are seen. Cervical spinal fusion plate is again noted. IMPRESSION: Cardiomegaly with central pulmonary vascular congestion. Electronically Signed   By: Ronney Asters M.D.   On: 10/01/2022 16:28   ECHOCARDIOGRAM COMPLETE  Result Date: 09/30/2022    ECHOCARDIOGRAM REPORT   Patient Name:   [name] Date of Exam: 09/30/2022 Medical Rec #:  [id number]       Height:       63.0 in Accession #:    [id number]      Weight:       178.1 lb Date of Birth:  05/19/1951       BSA:          1.841 m Patient Age:     71 years        BP:           164/50 mmHg Patient Gender: F               HR:           82 bpm. Exam Location:  [facility] Procedure: 2D Echo, Cardiac Doppler and Color Doppler Indications:    Congestive Heart Failure I50.9  History:        Patient has prior history of Echocardiogram examinations, most                 recent 03/05/2022. CHF, CAD and Previous Myocardial Infarction,                 COPD, Arrythmias:Atrial Fibrillation; Risk Factors:Hypertension,                 Diabetes and Dyslipidemia. ESRD (end stage renal disease) on                 dialysis.  Sonographer:    Alvino Chapel RCS Referring Phys: [id number] ASIA B [place]  1. Left ventricular ejection fraction, by estimation, is 55 to 60%. The left ventricle has normal function. Left ventricular endocardial border not optimally defined to evaluate regional wall motion. There is moderate asymmetric left ventricular hypertrophy of the lateral segment. Left ventricular diastolic parameters are indeterminate. There is the interventricular septum is flattened in diastole ('D' shaped left ventricle), consistent with right ventricular volume overload and the interventricular septum is flattened in systole, consistent with right ventricular pressure overload.  2. Right ventricular systolic function is mildly reduced. The right ventricular size is normal. There is mildly elevated pulmonary artery systolic pressure. The estimated right ventricular systolic pressure is 03.5 mmHg.  3. Left atrial size was moderately dilated.  4. There is no evidence of cardiac tamponade.  5. The mitral valve is degenerative. Mild mitral valve regurgitation. No evidence of mitral stenosis.  6. The aortic valve is tricuspid. There is mild calcification of the aortic valve. Aortic valve regurgitation is not visualized. Aortic valve sclerosis is present, with no evidence of aortic valve stenosis.  Comparison(s): Slight increase in RVSP from 2022. FINDINGS  Left Ventricle:  Left ventricular ejection fraction, by estimation, is 55 to 60%. The left ventricle has normal function. Left ventricular endocardial border not optimally defined to evaluate regional wall motion. The left ventricular internal cavity size was normal in size. There is moderate asymmetric left ventricular hypertrophy of the lateral segment. The interventricular septum is flattened in diastole ('D' shaped left ventricle), consistent with right ventricular volume overload and the interventricular septum is flattened in systole, consistent with right ventricular pressure overload. Left ventricular diastolic parameters are indeterminate. Right Ventricle: The right ventricular size is normal. No increase in right ventricular wall thickness. Right ventricular systolic function is mildly reduced. There is mildly elevated pulmonary artery systolic pressure. The tricuspid regurgitant velocity  is 2.66 m/s, and with an assumed right atrial pressure of 15 mmHg, the estimated right ventricular systolic pressure is 99.8 mmHg. Left Atrium: Left atrial size was moderately dilated. Right Atrium: Right atrial size was normal in size. Pericardium: Trivial pericardial effusion is present. The pericardial effusion is circumferential. There is no evidence of cardiac tamponade. Mitral Valve: The mitral valve is degenerative in appearance. Mild mitral valve regurgitation. No evidence of mitral valve stenosis. Tricuspid Valve: The tricuspid valve is normal in structure. Tricuspid valve regurgitation is mild . No evidence of tricuspid stenosis. Aortic Valve: The aortic valve is tricuspid. There is mild calcification of the aortic valve. There is mild aortic valve annular calcification. Aortic valve regurgitation is not visualized. Aortic valve sclerosis is present, with no evidence of aortic valve stenosis. Pulmonic Valve: The pulmonic valve was normal in structure. Pulmonic valve regurgitation is not visualized. No evidence of pulmonic  stenosis. Aorta: The aortic root is normal in size and structure. IAS/Shunts: No atrial level shunt detected by color flow Doppler.  LEFT VENTRICLE PLAX 2D LVIDd:         4.50 cm LVIDs:         2.90 cm LV PW:         1.50 cm LV IVS:        1.20 cm LVOT diam:     1.50 cm LV SV:         29 LV SV Index:   16 LVOT Area:     1.77 cm  RIGHT VENTRICLE TAPSE (M-mode): 1.3 cm LEFT ATRIUM             Index        RIGHT ATRIUM           Index LA diam:        4.70 cm 2.55 cm/m   RA Area:     20.70 cm LA Vol (A2C):   86.9 ml 47.21 ml/m  RA Volume:   57.70 ml  31.35 ml/m LA Vol (A4C):   68.0 ml 36.94 ml/m LA Biplane Vol: 77.1 ml 41.88 ml/m  AORTIC VALVE LVOT Vmax:   93.63 cm/s LVOT Vmean:  58.233 cm/s LVOT VTI:    0.166 m  AORTA Ao Root diam: 3.00 cm MITRAL VALVE                TRICUSPID VALVE MV Area (PHT): 4.80 cm     TR Peak grad:   28.3 mmHg MV Decel Time: 158 msec     TR Vmax:        266.00 cm/s MV E velocity: 140.00 cm/s MV A velocity: 45.10 cm/s   SHUNTS MV E/A ratio:  3.10  Systemic VTI:  0.17 m                             Systemic Diam: 1.50 cm Rudean Haskell MD Electronically signed by Rudean Haskell MD Signature Date/Time: 09/30/2022/1:37:40 PM    Final         Scheduled Meds:  amiodarone  200 mg Oral BID   apixaban  5 mg Oral BID   arformoterol  15 mcg Nebulization BID   aspirin EC  81 mg Oral Daily   atenolol  50 mg Oral QHS   budesonide (PULMICORT) nebulizer solution  0.25 mg Nebulization BID   Chlorhexidine Gluconate Cloth  6 each Topical Daily   Chlorhexidine Gluconate Cloth  6 each Topical Q0600   diltiazem  240 mg Oral Daily   DULoxetine  30 mg Oral QPM   insulin aspart  0-20 Units Subcutaneous TID WC   insulin aspart  0-5 Units Subcutaneous QHS   insulin aspart  5 Units Subcutaneous TID WC   insulin detemir  20 Units Subcutaneous QHS   ipratropium-albuterol  3 mL Nebulization Q6H WA   lanthanum  500 mg Oral TID WC   Living Better with Heart Failure Book   Does  not apply Once   mupirocin ointment   Nasal BID   polyethylene glycol  17 g Oral Daily   [START ON 10/03/2022] predniSONE  40 mg Oral Q breakfast   rosuvastatin  5 mg Oral Daily   Continuous Infusions:  [START ON 10/03/2022] cefTAZidime (FORTAZ)  IV     vancomycin 1,000 mg (10/01/22 1238)     LOS: 3 days    Time spent: 82mins    Kathie Dike, MD [facility]   If 7PM-7AM, please contact night-coverage [email or web address]  10/02/2022, 10:23 AM

## 2022-10-02 NOTE — Progress Notes (Signed)
Assumed care of this patient. Pt up in recliner talking with visitor. A&O. Denies any c/o pain or SOB. AAV fistula with good bruit and thrill. IV saline lock flushes without difficulty or s/s infiltration. Dialysis cath right chest with DDI. Pt tolerating oral fluids. Denies any needs, questions or concerns at this time.

## 2022-10-02 NOTE — Progress Notes (Signed)
Patient has not passed stool since 09/29/2022 and she said her regular bowel cycle is once in 4 days. Her abdomen assessment shows soft, obese abdomen and the patient has been passing gas as well. Will continue to monitor.

## 2022-10-02 NOTE — Inpatient Diabetes Management (Signed)
Inpatient Diabetes Program Recommendations  AACE/ADA: New Consensus Statement on Inpatient Glycemic Control   Target Ranges:  Prepandial:   less than 140 mg/dL      Peak postprandial:   less than 180 mg/dL (1-2 hours)      Critically ill patients:  140 - 180 mg/dL    Latest Reference Range & Units 10/01/22 07:30 10/01/22 11:31 10/01/22 16:23 10/01/22 20:48 10/02/22 07:13  Glucose-Capillary 70 - 99 mg/dL 83 207 (H) 166 (H) 373 (H) 340 (H)   Review of Glycemic Control  Diabetes history: DM2 Outpatient Diabetes medications: Tresiba 25 units QHS, Novolog 0-5 units TID with meals, Ozempic 0.5 mg Qweek Current orders for Inpatient glycemic control: Levemir 20 units QHS, Novolog 0-15 units TID with meals, Novolog 0-5 units QHS; Solumedrol 40 mg Q12H  Inpatient Diabetes Program Recommendations:    Insulin: If steroids are continued as ordered, please consider increasing Levemir to 25 units QHS and adding Novolog 4 units TID with meals for meal coverage if patient eats at least 50% of meals.  Thanks, Barnie Alderman, RN, MSN, [place] Diabetes Coordinator Inpatient Diabetes Program [phone] (Team Pager from 8am to [place])

## 2022-10-02 NOTE — TOC Initial Note (Signed)
Transition of Care [facility]) - Initial/Assessment Note    Patient Details  Name: [name] MRN: [id number] Date of Birth: August 21, 1951  Transition [facility]) CM/SW Contact:    Boneta Lucks, RN Phone Number: 10/02/2022, 11:14 AM  Clinical Narrative:        Patient admitted with CHF and has a high risk for readmission.Patient lives with her mother. Still drives short distances. Patient ride a Lucianne Lei to dialysis and uses a walker. But most days at home she is independent.  TOC consulted for CHF, living better book ordered, discussed home health and education with patient. She states she has had home health and CHF education in the past, not needed at this time.       Expected Discharge Plan: Home/Self Care Barriers to Discharge: Continued Medical Work up   Patient Goals and CMS Choice Patient states their goals for this hospitalization and ongoing recovery are:: to go home. CMS Medicare.gov Compare Post Acute Care list provided to:: Patient Choice offered to / list presented to : Patient  Expected Discharge Plan and Services Expected Discharge Plan: Home/Self Care       Living arrangements for the past 2 months: Single Family Home            Prior Living Arrangements/Services Living arrangements for the past 2 months: Single Family Home Lives with:: Parents Patient language and need for interpreter reviewed:: Yes Do you feel safe going back to the place where you live?: Yes      Need for Family Participation in Patient Care: Yes (Comment) Care giver support system in place?: Yes (comment) Current home services: DME Criminal Activity/Legal Involvement Pertinent to Current Situation/Hospitalization: No - Comment as needed  Activities of Daily Living Home Assistive Devices/Equipment: Bedside commode/3-in-1, Eyeglasses, Cane (specify quad or straight), Oxygen, Walker (specify type) ADL Screening (condition at time of admission) Patient's cognitive ability adequate to safely  complete daily activities?: Yes Is the patient deaf or have difficulty hearing?: No Does the patient have difficulty seeing, even when wearing glasses/contacts?: No Does the patient have difficulty concentrating, remembering, or making decisions?: No Patient able to express need for assistance with ADLs?: Yes Does the patient have difficulty dressing or bathing?: No Independently performs ADLs?: Yes (appropriate for developmental age) Does the patient have difficulty walking or climbing stairs?: Yes Weakness of Legs: Both Weakness of Arms/Hands: None  Permission Sought/Granted        Emotional Assessment     Affect (typically observed): Accepting Orientation: : Oriented to Self, Oriented to Place, Oriented to  Time, Oriented to Situation Alcohol / Substance Use: Not Applicable Psych Involvement: No (comment)  Admission diagnosis:  CHF exacerbation ([place]) [I50.9] Volume overload [E87.70] Patient Active Problem List   Diagnosis Date Noted   Volume overload 09/30/2022   CHF exacerbation ([place]) 09/29/2022   Secondary hypercoagulable state ([place]) 09/06/2022   Non-ST elevation (NSTEMI) myocardial infarction ([place]) 07/10/2022   Chronic atrial fibrillation ([place]) 07/10/2022   Anxiety    Acute blood loss anemia 06/10/2022   Permanent atrial fibrillation ([place]) 06/10/2022   CAD S/P percutaneous coronary angioplasty 06/10/2022   Heart failure with preserved ejection fraction ([place]) 06/10/2022   Symptomatic anemia 05/30/2022   GI bleed 05/30/2022   Acute respiratory failure with hypoxia (HCC) 04/26/2022   Sepsis ([place]) 04/26/2022   Paroxysmal atrial fibrillation ([place]) 04/26/2022   Multifocal pneumonia 04/25/2022   Dyslipidemia 04/04/2022   Acute exacerbation of Diastolic CHF (congestive heart failure) ([place]) 03/26/2022   Physical deconditioning 03/09/2022  Hyponatremia 03/09/2022   Pulmonary hypertension ([place]) 03/06/2022   Hypokalemia 03/05/2022   Change in bowel habit 12/16/2021    Colon cancer screening 12/16/2021   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   Family history of colonic polyps 12/16/2021   Acute CHF (congestive heart failure) ([place]) 12/06/2021   Leukocytosis 12/06/2021   Elevated brain natriuretic peptide (BNP) level 12/06/2021   GERD (gastroesophageal reflux disease) 12/06/2021   Nausea 08/26/2021   Lumbago of lumbar region with sciatica 07/21/2021   Arthropathy of lumbar facet joint 06/27/2021   Radiculopathy, lumbar region 05/05/2021   Chest pain 02/19/2021   Essential hypertension    Mixed hyperlipidemia    Hypoalbuminemia    Bruit 01/05/2020   Educated about COVID-19 virus infection 01/05/2020   Lung nodule 05/01/2018   Prolonged Q-T interval on ECG 09/06/2017   Unstable angina (HCC) 09/06/2017   GAD (generalized anxiety disorder) 03/04/2016   Leg pain 01/07/2016   Anemia of chronic disease/ESRD 12/10/2015   Dependence on renal dialysis ([place]) 12/10/2015   ESRD (end stage renal disease) on dialysis ([place]) 12/10/2015   Osteopathy in diseases classified elsewhere, unspecified site 12/10/2015   Constipation 10/25/2015   COPD (chronic obstructive pulmonary disease) ([place]) 03/06/2015   Pericardial effusion 03/06/2015   Chronic diastolic CHF (congestive heart failure) ([place]) 03/05/2015   PAD (peripheral artery disease) ([place]) 04/21/2014   Dyspnea 09/29/2013   Obesity (BMI 30.0-34.9) 12/05/2011   Diverticulosis 03/29/2011   Diabetic neuropathy ([place]) 03/29/2011   Esophageal stricture 03/29/2011   Vitamin D deficiency 03/29/2011   Degenerative joint disease of left shoulder 03/29/2011   Neck pain 03/29/2011   Type 2 diabetes mellitus with hyperlipidemia ([place]) 04/19/2009   Hyperlipidemia associated with type 2 diabetes mellitus ([place]) 04/19/2009   Coronary artery disease of native artery of native heart with stable angina pectoris ([place]) 04/19/2009   PCP:  [name], MD Pharmacy:   [facility] [address], [address]  [place] [address] Phone: [phone] Fax: [phone]   Readmission Risk Interventions    10/02/2022   11:12 AM 05/31/2022    8:33 AM 04/28/2022    2:04 PM  Readmission Risk Prevention Plan  Transportation Screening Complete Complete Complete  Medication Review ([place]) Complete Complete Complete  PCP or Specialist appointment within 3-5 days of discharge Not Complete  Complete  HRI or [place] Complete Complete Complete  SW Recovery Care/Counseling Consult Complete    Palliative Care Screening Not Applicable Not [place] Not Applicable Not Applicable

## 2022-10-02 NOTE — Progress Notes (Signed)
Bipap is PRN order.  Not needed at this time. 

## 2022-10-02 NOTE — Progress Notes (Signed)
Subjective:  hypertensive overnight-  she says coughing and trouble breathing overnight but this AM off of O2-  is hoping Dr. Donnetta Hutching can look at her AVG while she is here   Objective Vital signs in last 24 hours: Vitals:   10/02/22 0600 10/02/22 0700 10/02/22 0714 10/02/22 0721  BP: (!) 181/63 (!) 178/97    Pulse: 74 74 72   Resp: 13 16 14    Temp:   97.9 F (36.6 C)   TempSrc:   Oral   SpO2: 100% 100% 100% 100%  Weight:      Height:       Weight change: -1.1 kg  Intake/Output Summary (Last 24 hours) at 10/02/2022 7741 Last data filed at 10/02/2022 0600 Gross per 24 hour  Intake 860.07 ml  Output 0 ml  Net 860.07 ml   [facility]  TTS , 3:45,  EDW 80 kg, 1 K /2.5 calc,  heparin - yes, calcitriol 1.75 TIW, mircera 100 q 2 weeks, venofer 50 weekly-  using TDC 350 BFR/500 DFR  Was on vanc 1 gram and fortaz 1 gram after treatment thru 11/5 for possible infected new AVG   Assessment/Plan:   Acute on Chronic diastolic CHF - optimize volume status with HD    ESRD  - HD per TTS schedule -  due tomorrow with volume challenge    COPD exacerbation  - per primary team  - on steroids and has nebs scheduled and PRN -    Chest pain  - Hx of CAD - Per primary team    HTN  - Optimize volume status with HD -  challenge tomorrow - Continue current regimen    A fib  - noted - pt on dilt and amiodarone    Anemia CKD  - Hb 11.5 - acceptable    Metabolic bone disease  - continue calictriol tiw - hyperphos - resume home fosrenol   Possible AVG infection-   -on vanc and fortaz as OP-  to be continued here-  reportedly has appt with [facility] on 11/1-  I will see if Dr. Donnetta Hutching can see her while she is here       [place]: Basic Metabolic Panel: Recent Labs  Lab 09/29/22 1728 09/30/22 0403 10/02/22 0346  NA 139 139 131*  K 3.4* 3.3* 4.1  CL 99 98 95*  CO2 25 24 20*  GLUCOSE 193* 179* 303*  BUN 15 21 34*  CREATININE 7.35* 7.82* 8.21*  CALCIUM 9.9  9.9 9.8  PHOS  --  7.6* 5.8*   Liver Function Tests: Recent Labs  Lab 09/30/22 0403 10/02/22 0346  AST 31  --   ALT 26  --   ALKPHOS 63  --   BILITOT 1.0  --   PROT 6.8  --   ALBUMIN 3.7 3.6   No results for input(s): "LIPASE", "AMYLASE" in the last 168 hours. No results for input(s): "AMMONIA" in the last 168 hours. CBC: Recent Labs  Lab 09/29/22 1728 09/30/22 0403  WBC 15.6* 14.2*  NEUTROABS  --  10.5*  HGB 11.7* 11.5*  HCT 36.7 36.5  MCV 87.6 88.6  PLT 249 216   Cardiac Enzymes: No results for input(s): "CKTOTAL", "CKMB", "CKMBINDEX", "TROPONINI" in the last 168 hours. CBG: Recent Labs  Lab 10/01/22 0730 10/01/22 1131 10/01/22 1623 10/01/22 2048 10/02/22 0713  GLUCAP 83 207* 166* 373* 340*    Iron Studies: No results for input(s): "IRON", "TIBC", "TRANSFERRIN", "FERRITIN" in the  last 72 hours. Studies/Results: DG CHEST PORT 1 VIEW  Result Date: 10/01/2022 CLINICAL DATA:  Shortness of breath. EXAM: PORTABLE CHEST 1 VIEW COMPARISON:  Chest x-ray 09/29/2022 FINDINGS: Right-sided central venous catheter tip projects over the cavoatrial junction, unchanged. The heart is enlarged, unchanged. There is central pulmonary vascular congestion. There is no focal lung consolidation, pleural effusion or pneumothorax. No acute fractures are seen. Cervical spinal fusion plate is again noted. IMPRESSION: Cardiomegaly with central pulmonary vascular congestion. Electronically Signed   By: Ronney Asters M.D.   On: 10/01/2022 16:28   ECHOCARDIOGRAM COMPLETE  Result Date: 09/30/2022    ECHOCARDIOGRAM REPORT   Patient Name:   [name] Date of Exam: 09/30/2022 Medical Rec #:  [id number]       Height:       63.0 in Accession #:    [id number]      Weight:       178.1 lb Date of Birth:  Jul 31, 1951       BSA:          1.841 m Patient Age:    71 years        BP:           164/50 mmHg Patient Gender: F               HR:           82 bpm. Exam Location:  [facility] Procedure: 2D Echo,  Cardiac Doppler and Color Doppler Indications:    Congestive Heart Failure I50.9  History:        Patient has prior history of Echocardiogram examinations, most                 recent 03/05/2022. CHF, CAD and Previous Myocardial Infarction,                 COPD, Arrythmias:Atrial Fibrillation; Risk Factors:Hypertension,                 Diabetes and Dyslipidemia. ESRD (end stage renal disease) on                 dialysis.  Sonographer:    Alvino Chapel RCS Referring Phys: [id number] ASIA B [place]  1. Left ventricular ejection fraction, by estimation, is 55 to 60%. The left ventricle has normal function. Left ventricular endocardial border not optimally defined to evaluate regional wall motion. There is moderate asymmetric left ventricular hypertrophy of the lateral segment. Left ventricular diastolic parameters are indeterminate. There is the interventricular septum is flattened in diastole ('D' shaped left ventricle), consistent with right ventricular volume overload and the interventricular septum is flattened in systole, consistent with right ventricular pressure overload.  2. Right ventricular systolic function is mildly reduced. The right ventricular size is normal. There is mildly elevated pulmonary artery systolic pressure. The estimated right ventricular systolic pressure is 16.6 mmHg.  3. Left atrial size was moderately dilated.  4. There is no evidence of cardiac tamponade.  5. The mitral valve is degenerative. Mild mitral valve regurgitation. No evidence of mitral stenosis.  6. The aortic valve is tricuspid. There is mild calcification of the aortic valve. Aortic valve regurgitation is not visualized. Aortic valve sclerosis is present, with no evidence of aortic valve stenosis. Comparison(s): Slight increase in RVSP from 2022. FINDINGS  Left Ventricle: Left ventricular ejection fraction, by estimation, is 55 to 60%. The left ventricle has normal function. Left ventricular endocardial border  not optimally defined to evaluate regional wall motion.  The left ventricular internal cavity size was normal in size. There is moderate asymmetric left ventricular hypertrophy of the lateral segment. The interventricular septum is flattened in diastole ('D' shaped left ventricle), consistent with right ventricular volume overload and the interventricular septum is flattened in systole, consistent with right ventricular pressure overload. Left ventricular diastolic parameters are indeterminate. Right Ventricle: The right ventricular size is normal. No increase in right ventricular wall thickness. Right ventricular systolic function is mildly reduced. There is mildly elevated pulmonary artery systolic pressure. The tricuspid regurgitant velocity  is 2.66 m/s, and with an assumed right atrial pressure of 15 mmHg, the estimated right ventricular systolic pressure is 44.0 mmHg. Left Atrium: Left atrial size was moderately dilated. Right Atrium: Right atrial size was normal in size. Pericardium: Trivial pericardial effusion is present. The pericardial effusion is circumferential. There is no evidence of cardiac tamponade. Mitral Valve: The mitral valve is degenerative in appearance. Mild mitral valve regurgitation. No evidence of mitral valve stenosis. Tricuspid Valve: The tricuspid valve is normal in structure. Tricuspid valve regurgitation is mild . No evidence of tricuspid stenosis. Aortic Valve: The aortic valve is tricuspid. There is mild calcification of the aortic valve. There is mild aortic valve annular calcification. Aortic valve regurgitation is not visualized. Aortic valve sclerosis is present, with no evidence of aortic valve stenosis. Pulmonic Valve: The pulmonic valve was normal in structure. Pulmonic valve regurgitation is not visualized. No evidence of pulmonic stenosis. Aorta: The aortic root is normal in size and structure. IAS/Shunts: No atrial level shunt detected by color flow Doppler.  LEFT  VENTRICLE PLAX 2D LVIDd:         4.50 cm LVIDs:         2.90 cm LV PW:         1.50 cm LV IVS:        1.20 cm LVOT diam:     1.50 cm LV SV:         29 LV SV Index:   16 LVOT Area:     1.77 cm  RIGHT VENTRICLE TAPSE (M-mode): 1.3 cm LEFT ATRIUM             Index        RIGHT ATRIUM           Index LA diam:        4.70 cm 2.55 cm/m   RA Area:     20.70 cm LA Vol (A2C):   86.9 ml 47.21 ml/m  RA Volume:   57.70 ml  31.35 ml/m LA Vol (A4C):   68.0 ml 36.94 ml/m LA Biplane Vol: 77.1 ml 41.88 ml/m  AORTIC VALVE LVOT Vmax:   93.63 cm/s LVOT Vmean:  58.233 cm/s LVOT VTI:    0.166 m  AORTA Ao Root diam: 3.00 cm MITRAL VALVE                TRICUSPID VALVE MV Area (PHT): 4.80 cm     TR Peak grad:   28.3 mmHg MV Decel Time: 158 msec     TR Vmax:        266.00 cm/s MV E velocity: 140.00 cm/s MV A velocity: 45.10 cm/s   SHUNTS MV E/A ratio:  3.10         Systemic VTI:  0.17 m                             Systemic Diam: 1.50 cm Mahesh  Chandrasekhar MD Electronically signed by Rudean Haskell MD Signature Date/Time: 09/30/2022/1:37:40 PM    Final    Medications: Infusions:  [START ON 10/03/2022] cefTAZidime (FORTAZ)  IV     vancomycin 1,000 mg (10/01/22 1238)    Scheduled Medications:  amiodarone  200 mg Oral BID   apixaban  5 mg Oral BID   arformoterol  15 mcg Nebulization BID   aspirin EC  81 mg Oral Daily   atenolol  50 mg Oral QHS   budesonide (PULMICORT) nebulizer solution  0.25 mg Nebulization BID   Chlorhexidine Gluconate Cloth  6 each Topical Daily   Chlorhexidine Gluconate Cloth  6 each Topical Q0600   diltiazem  240 mg Oral Daily   DULoxetine  30 mg Oral QPM   insulin aspart  0-15 Units Subcutaneous TID WC   insulin aspart  0-5 Units Subcutaneous QHS   insulin detemir  20 Units Subcutaneous QHS   ipratropium-albuterol  3 mL Nebulization Q6H WA   lanthanum  500 mg Oral TID WC   Living Better with Heart Failure Book   Does not apply Once   methylPREDNISolone (SOLU-MEDROL) injection  40 mg  Intravenous Q12H   mupirocin ointment   Nasal BID   rosuvastatin  5 mg Oral Daily    have reviewed scheduled and prn medications.  Physical Exam: General:  obese, NAD Heart: RRR Lungs: mostly clear Abdomen: obese, soft, non tender Extremities: minimal edema  Dialysis Access: TDC and right upper AVG-  good thirll and bruit-  did not take bandage down-  according to her still draining    10/02/2022,8:21 AM  LOS: 3 days

## 2022-10-03 DIAGNOSIS — D638 Anemia in other chronic diseases classified elsewhere: Secondary | ICD-10-CM | POA: Diagnosis not present

## 2022-10-03 DIAGNOSIS — N186 End stage renal disease: Secondary | ICD-10-CM | POA: Diagnosis not present

## 2022-10-03 DIAGNOSIS — I5033 Acute on chronic diastolic (congestive) heart failure: Secondary | ICD-10-CM | POA: Diagnosis not present

## 2022-10-03 DIAGNOSIS — Z992 Dependence on renal dialysis: Secondary | ICD-10-CM | POA: Diagnosis not present

## 2022-10-03 DIAGNOSIS — J9601 Acute respiratory failure with hypoxia: Secondary | ICD-10-CM | POA: Diagnosis not present

## 2022-10-03 LAB — RENAL FUNCTION PANEL
Albumin: 3.5 g/dL (ref 3.5–5.0)
Anion gap: 17 — ABNORMAL HIGH (ref 5–15)
BUN: 61 mg/dL — ABNORMAL HIGH (ref 8–23)
CO2: 21 mmol/L — ABNORMAL LOW (ref 22–32)
Calcium: 10.2 mg/dL (ref 8.9–10.3)
Chloride: 91 mmol/L — ABNORMAL LOW (ref 98–111)
Creatinine, Ser: 9.51 mg/dL — ABNORMAL HIGH (ref 0.44–1.00)
GFR, Estimated: 4 mL/min — ABNORMAL LOW (ref >60)
Glucose, Bld: 179 mg/dL — ABNORMAL HIGH (ref 70–99)
Phosphorus: 6.3 mg/dL — ABNORMAL HIGH (ref 2.5–4.6)
Potassium: 4.3 mmol/L (ref 3.5–5.1)
Sodium: 129 mmol/L — ABNORMAL LOW (ref 135–145)

## 2022-10-03 LAB — GLUCOSE, CAPILLARY
Glucose-Capillary: 162 mg/dL — ABNORMAL HIGH (ref 70–99)
Glucose-Capillary: 195 mg/dL — ABNORMAL HIGH (ref 70–99)

## 2022-10-03 NOTE — Progress Notes (Signed)
PROGRESS NOTE    [name]  UKG:[id number] DOB: 1951-04-03 DOA: 09/29/2022 PCP: [name], MD    Brief Narrative:  71 year old female with a history of end-stage renal disease on hemodialysis, diastolic heart failure, atrial fibrillation, admitted to the hospital with shortness of breath.  Found to be in volume overload.  Nephrology consulted for dialysis needs.   Assessment & Plan:   Principal Problem:   CHF exacerbation ([place]) Active Problems:   ESRD (end stage renal disease) on dialysis ([place])   Type 2 diabetes mellitus with hyperlipidemia (HCC)   Coronary artery disease of native artery of native heart with stable angina pectoris (HCC)   Prolonged Q-T interval on ECG   Anemia of chronic disease/ESRD   Leukocytosis   Hypokalemia   Dyslipidemia   Acute respiratory failure with hypoxia (HCC)   Paroxysmal atrial fibrillation (HCC)   Volume overload   ESRD on HD -T-Th-Sat at [facility] -patient reports that last treatment was on Thursday and was full treatment -she reports compliance with her salt/fluid intake -Nephrology following -Patient had HD treatment on 10/28 with removal of 3.5 L -Follow-up chest x-ray does show some improvement in congestion   Acute on chronic diastolic CHF -volume status managed with HD -next HD session today -continue BB, ASA, statin   Insulin dependent DM type 2 -continue basal insulin -sliding scale insulin -Blood sugars elevated related to steroids -change SSI to resistant and add meal coverage novolog -tapering steroids   Acute on chronic respiratory failure with hypoxia  -related to pulmonary edema -normally wears oxygen only at night -briefly required bipap on admission for respiratory distress -currently weaned down to RA   COPD -Noted to have bilateral wheezing -continue bronchodilators -continue dulera -on prednisone taper   Pulmonary hypertension -felt to be secondary to COPD   PAF -continue diltiazem,  amiodarone, atenolol and eliquis   Dialysis graft infection -Informed by patient that she was recently started on IV antibiotics with dialysis for dialysis graft infection -Since she is followed at [facility], I was able to reach out to one of the nephrologists from her primary nephrologist group and reviewed her records.  Per records, there was concern that she had an infection in her dialysis graft due to bloody drainage from this area and she was started on vancomycin and Fortaz on 10/24 to be taken after dialysis for the next 2 weeks.  She has an appointment to see her vascular surgeon on 11/1.  We will continue vancomycin and Tressie Ellis here in the hospital so as not to interrupt her antibiotic course. -Vascular surgery evaluated in the hospital and felt graft was healing well and should be able to be used after 2 more weeks     DVT prophylaxis: SCDs Start: 09/29/22 2328 apixaban (ELIQUIS) tablet 5 mg  Code Status: Full code Family Communication: Discussed with patient Disposition Plan: Status is: Inpatient Remains inpatient appropriate because: Continued management of respiratory issues including volume overload and COPD   Consultants:  Nephrology Vascular surgery  Procedures:    Antimicrobials:  Vancomycin and Fortaz, continued from outpatient course   Subjective: Continues to have wheezing and cough.  Objective: Vitals:   10/03/22 1536 10/03/22 1826 10/03/22 1953 10/03/22 2017  BP:  (!) 161/84  (!) 153/84  Pulse:  80  86  Resp:    18  Temp:    97.7 F (36.5 C)  TempSrc:    Oral  SpO2:   95% 96%  Weight: 78 kg  Height:        Intake/Output Summary (Last 24 hours) at 10/03/2022 2018 Last data filed at 10/03/2022 1700 Gross per 24 hour  Intake 720 ml  Output 3500 ml  Net -2780 ml   Filed Weights   10/02/22 1000 10/03/22 1139 10/03/22 1536  Weight: 79.7 kg 81.6 kg 78 kg    Examination:  General exam: Appears calm and comfortable  Respiratory system:  mild wheeze b/l, improving Cardiovascular system: S1 & S2 heard, RRR. No JVD, murmurs, rubs, gallops or clicks. No pedal edema. Gastrointestinal system: Abdomen is nondistended, soft and nontender. No organomegaly or masses felt. Normal bowel sounds heard. Central nervous system: Alert and oriented. No focal neurological deficits. Extremities: She is noted to have more swelling in her right upper extremity than her left upper extremity Skin: No rashes, lesions or ulcers Psychiatry: Judgement and insight appear normal. Mood & affect appropriate.     Data Reviewed: I have personally reviewed following labs and imaging studies  CBC: Recent Labs  Lab 09/29/22 1728 09/30/22 0403  WBC 15.6* 14.2*  NEUTROABS  --  10.5*  HGB 11.7* 11.5*  HCT 36.7 36.5  MCV 87.6 88.6  PLT 249 850   Basic Metabolic Panel: Recent Labs  Lab 09/29/22 1728 09/30/22 0403 10/02/22 0346 10/03/22 0410  NA 139 139 131* 129*  K 3.4* 3.3* 4.1 4.3  CL 99 98 95* 91*  CO2 25 24 20* 21*  GLUCOSE 193* 179* 303* 179*  BUN 15 21 34* 61*  CREATININE 7.35* 7.82* 8.21* 9.51*  CALCIUM 9.9 9.9 9.8 10.2  MG  --  2.3  --   --   PHOS  --  7.6* 5.8* 6.3*   GFR: Estimated Creatinine Clearance: 5.4 mL/min (A) (by C-G formula based on SCr of 9.51 mg/dL (H)). Liver Function Tests: Recent Labs  Lab 09/30/22 0403 10/02/22 0346 10/03/22 0410  AST 31  --   --   ALT 26  --   --   ALKPHOS 63  --   --   BILITOT 1.0  --   --   PROT 6.8  --   --   ALBUMIN 3.7 3.6 3.5   No results for input(s): "LIPASE", "AMYLASE" in the last 168 hours. No results for input(s): "AMMONIA" in the last 168 hours. Coagulation Profile: No results for input(s): "INR", "PROTIME" in the last 168 hours. Cardiac Enzymes: No results for input(s): "CKTOTAL", "CKMB", "CKMBINDEX", "TROPONINI" in the last 168 hours. BNP (last 3 results) No results for input(s): "PROBNP" in the last 8760 hours. HbA1C: No results for input(s): "HGBA1C" in the last 72  hours.  CBG: Recent Labs  Lab 10/02/22 0713 10/02/22 1127 10/02/22 1649 10/02/22 2059 10/03/22 0757  GLUCAP 340* 332* 236* 162* 195*   Lipid Profile: No results for input(s): "CHOL", "HDL", "LDLCALC", "TRIG", "CHOLHDL", "LDLDIRECT" in the last 72 hours. Thyroid Function Tests: No results for input(s): "TSH", "T4TOTAL", "FREET4", "T3FREE", "THYROIDAB" in the last 72 hours. Anemia Panel: No results for input(s): "VITAMINB12", "FOLATE", "FERRITIN", "TIBC", "IRON", "RETICCTPCT" in the last 72 hours. Sepsis Labs: Recent Labs  Lab 09/29/22 1935 09/30/22 0403 10/01/22 0327  PROCALCITON 0.30 0.58 0.67    Recent Results (from the past 240 hour(s))  Resp Panel by RT-PCR (Flu A&B, Covid) Anterior Nasal Swab     Status: None   Collection Time: 09/29/22  5:55 PM   Specimen: Anterior Nasal Swab  Result Value Ref Range Status   SARS Coronavirus 2 by RT PCR NEGATIVE NEGATIVE  Final    Comment: (NOTE) SARS-CoV-2 target nucleic acids are NOT DETECTED.  The SARS-CoV-2 RNA is generally detectable in upper respiratory specimens during the acute phase of infection. The lowest concentration of SARS-CoV-2 viral copies this assay can detect is 138 copies/mL. A negative result does not preclude SARS-Cov-2 infection and should not be used as the sole basis for treatment or other patient management decisions. A negative result may occur with  improper specimen collection/handling, submission of specimen other than nasopharyngeal swab, presence of viral mutation(s) within the areas targeted by this assay, and inadequate number of viral copies(<138 copies/mL). A negative result must be combined with clinical observations, patient history, and epidemiological information. The expected result is Negative.  Fact Sheet for Patients:  EntrepreneurPulse.com.au  Fact Sheet for Healthcare Providers:  IncredibleEmployment.be  This test is no t yet approved or cleared  by the [place] FDA and  has been authorized for detection and/or diagnosis of SARS-CoV-2 by FDA under an Emergency Use Authorization (EUA). This EUA will remain  in effect (meaning this test can be used) for the duration of the COVID-19 declaration under Section 564(b)(1) of the Act, 21 U.S.C.section 360bbb-3(b)(1), unless the authorization is terminated  or revoked sooner.       Influenza A by PCR NEGATIVE NEGATIVE Final   Influenza B by PCR NEGATIVE NEGATIVE Final    Comment: (NOTE) The Xpert Xpress SARS-CoV-2/FLU/RSV plus assay is intended as an aid in the diagnosis of influenza from Nasopharyngeal swab specimens and should not be used as a sole basis for treatment. Nasal washings and aspirates are unacceptable for Xpert Xpress SARS-CoV-2/FLU/RSV testing.  Fact Sheet for Patients: EntrepreneurPulse.com.au  Fact Sheet for Healthcare Providers: IncredibleEmployment.be  This test is not yet approved or cleared by the [place] FDA and has been authorized for detection and/or diagnosis of SARS-CoV-2 by FDA under an Emergency Use Authorization (EUA). This EUA will remain in effect (meaning this test can be used) for the duration of the COVID-19 declaration under Section 564(b)(1) of the Act, 21 U.S.C. section 360bbb-3(b)(1), unless the authorization is terminated or revoked.  Performed at [facility], [address], [place], [place] [address]   Culture, Respiratory w Gram Stain     Status: None   Collection Time: 09/29/22  7:55 PM   Specimen: SPU  Result Value Ref Range Status   Specimen Description   Final    SPUTUM Performed at [facility], [address] [place], [place] [address]    Special Requests   Final    NONE Performed at [facility], [address], [place], [place] [address]    Gram Stain   Final    FEW WBC PRESENT, PREDOMINANTLY MONONUCLEAR FEW GRAM NEGATIVE RODS FEW GRAM POSITIVE COCCI IN PAIRS FEW SQUAMOUS  EPITHELIAL CELLS PRESENT    Culture   Final    MODERATE Normal respiratory flora-no Staph aureus or Pseudomonas seen Performed at [facility], [address] [place], [place] [address]    Report Status 10/02/2022 FINAL  Final  MRSA Next Gen by PCR, Nasal     Status: Abnormal   Collection Time: 09/29/22 11:29 PM   Specimen: Nasal Mucosa; Nasal Swab  Result Value Ref Range Status   MRSA by PCR Next Gen DETECTED (A) NOT DETECTED Final    Comment: RESULT CALLED TO, READ BACK BY AND VERIFIED WITH: Much L @ 5176 ON [id number] BY HENDERSON L (NOTE) The GeneXpert MRSA Assay (FDA approved for NASAL specimens only), is  one component of a comprehensive MRSA colonization surveillance program. It is not intended to diagnose MRSA infection nor to guide or monitor treatment for MRSA infections. Test performance is not FDA approved in patients less than 58 years old. Performed at [facility], [address], [place], [place] [address]          Radiology Studies: US Venous Img Upper Uni Right(DVT)  Result Date: 10/02/2022 CLINICAL DATA:  Upper extremity swelling. EXAM: RIGHT UPPER EXTREMITY VENOUS DOPPLER ULTRASOUND TECHNIQUE: Gray-scale sonography with graded compression, as well as color Doppler and duplex ultrasound were performed to evaluate the upper extremity deep venous system from the level of the subclavian vein and including the jugular, axillary, basilic, radial, ulnar and upper cephalic vein. Spectral Doppler was utilized to evaluate flow at rest and with distal augmentation maneuvers. COMPARISON:  Chest XR, 10/02/2019. FINDINGS: VENOUS Dialysis catheter within the imaged R IJV. Otherwise normal compressibility of the RIGHT internal jugular, subclavian, axillary, cephalic, basilic, brachial, radial and ulnar veins. No filling defects to suggest DVT on grayscale or color Doppler imaging. Doppler waveforms show normal direction of venous flow, normal respiratory plasticity and response  to augmentation. Limited views of the contralateral subclavian vein are unremarkable. OTHER RIGHT upper extremity arteriovenous fistula with enlargement of the RIGHT brachial vein and thrill within the axillary vein. No evidence of superficial thrombophlebitis or abnormal fluid collection. Limitations: Dressings overlying the RIGHT neck and chest limiting evaluation of the R IJV and venous confluence. IMPRESSION: Suboptimal evaluation, within these constraints; 1. No definitive evidence of DVT or superficial thrombophlebitis within the RIGHT upper extremity. 2. Ipsilateral (RIGHT) upper extremity fistula and dialysis catheter. Findings likely to represent the etiology of patient's reported upper extremity swelling. Electronically Signed   By: Michaelle Birks M.D.   On: 10/02/2022 10:39        Scheduled Meds:  amiodarone  200 mg Oral BID   apixaban  5 mg Oral BID   arformoterol  15 mcg Nebulization BID   aspirin EC  81 mg Oral Daily   atenolol  50 mg Oral QHS   budesonide (PULMICORT) nebulizer solution  0.25 mg Nebulization BID   Chlorhexidine Gluconate Cloth  6 each Topical Daily   Chlorhexidine Gluconate Cloth  6 each Topical Q0600   Chlorhexidine Gluconate Cloth  6 each Topical Q0600   diltiazem  240 mg Oral Daily   DULoxetine  30 mg Oral QPM   insulin aspart  0-20 Units Subcutaneous TID WC   insulin aspart  0-5 Units Subcutaneous QHS   insulin aspart  5 Units Subcutaneous TID WC   insulin detemir  20 Units Subcutaneous QHS   ipratropium-albuterol  3 mL Nebulization Q6H WA   lanthanum  500 mg Oral TID WC   Living Better with Heart Failure Book   Does not apply Once   mupirocin ointment   Nasal BID   polyethylene glycol  17 g Oral Daily   predniSONE  40 mg Oral Q breakfast   rosuvastatin  5 mg Oral Daily   Continuous Infusions:  cefTAZidime (FORTAZ)  IV     vancomycin 1,000 mg (10/03/22 1612)     LOS: 4 days    Time spent: 66mins    Kathie Dike, MD [facility]   If 7PM-7AM, please contact night-coverage [email or web address]  10/03/2022, 8:18 PM

## 2022-10-03 NOTE — Progress Notes (Signed)
Received patient in bed to unit.  Alert and oriented.  Informed consent signed and in chart.   Treatment initiated: 1130 Treatment completed: 1530  Patient tolerated well.  Transported back to the room  Alert, without acute distress.  Hand-off given to patient's nurse.   Access used: catheter Access issues: none   Total UF removed: 3.5 L Medication(s) given: none Post HD VS: 150/70 P 84 R 18 O2 98 in room air. Post HD weight: 78kg   [facility]

## 2022-10-03 NOTE — Progress Notes (Signed)
Subjective:  still a little hypertensive overnight-  has been on room air-  still coughing some especially at night   Objective Vital signs in last 24 hours: Vitals:   10/02/22 2000 10/03/22 0517 10/03/22 0757 10/03/22 0805  BP: (!) 167/70 (!) 158/57    Pulse:  76    Resp: 16 18    Temp: 98.1 F (36.7 C) (!) 97.1 F (36.2 C)    TempSrc: Oral     SpO2: 100% 97% 97% 100%  Weight:      Height:       Weight change: -0.1 kg  Intake/Output Summary (Last 24 hours) at 10/03/2022 7654 Last data filed at 10/03/2022 0549 Gross per 24 hour  Intake 240 ml  Output --  Net 240 ml   [facility]  TTS , 3:45,  EDW 80 kg, 1 K /2.5 calc,  heparin - yes, calcitriol 1.75 TIW, mircera 100 q 2 weeks, venofer 50 weekly-  using TDC 350 BFR/500 DFR  Was on vanc 1 gram and fortaz 1 gram after treatment thru 11/5 for possible infected new AVG   Assessment/Plan:   Acute on Chronic diastolic CHF - optimize volume status with HD    ESRD  - HD per TTS schedule -  due today with volume challenge    COPD exacerbation  - per primary team  - on steroids and has nebs scheduled and PRN -    Chest pain  - Hx of CAD - Per primary team    HTN  - Optimize volume status with HD -  challenge today - Continue current regimen    A fib  - noted - pt on dilt and amiodarone    Anemia CKD  - Hb 11.5 - acceptable -  no meds needed   Metabolic bone disease  - continue calictriol tiw - hyperphos - resume home fosrenol   Possible AVG infection-   -on vanc and fortaz as OP-  to be continued here-  reportedly has appt with [facility] on 11/1-  I did make contact with Dr. Donnetta Hutching-  he is to take a look at her today here      [place]: Basic Metabolic Panel: Recent Labs  Lab 09/30/22 0403 10/02/22 0346 10/03/22 0410  NA 139 131* 129*  K 3.3* 4.1 4.3  CL 98 95* 91*  CO2 24 20* 21*  GLUCOSE 179* 303* 179*  BUN 21 34* 61*  CREATININE 7.82* 8.21* 9.51*  CALCIUM 9.9 9.8 10.2  PHOS  7.6* 5.8* 6.3*   Liver Function Tests: Recent Labs  Lab 09/30/22 0403 10/02/22 0346 10/03/22 0410  AST 31  --   --   ALT 26  --   --   ALKPHOS 63  --   --   BILITOT 1.0  --   --   PROT 6.8  --   --   ALBUMIN 3.7 3.6 3.5   No results for input(s): "LIPASE", "AMYLASE" in the last 168 hours. No results for input(s): "AMMONIA" in the last 168 hours. CBC: Recent Labs  Lab 09/29/22 1728 09/30/22 0403  WBC 15.6* 14.2*  NEUTROABS  --  10.5*  HGB 11.7* 11.5*  HCT 36.7 36.5  MCV 87.6 88.6  PLT 249 216   Cardiac Enzymes: No results for input(s): "CKTOTAL", "CKMB", "CKMBINDEX", "TROPONINI" in the last 168 hours. CBG: Recent Labs  Lab 10/01/22 2048 10/02/22 0713 10/02/22 1127 10/02/22 1649 10/03/22 0757  GLUCAP 373* 340* 332* 236* 195*  Iron Studies: No results for input(s): "IRON", "TIBC", "TRANSFERRIN", "FERRITIN" in the last 72 hours. Studies/Results: US Venous Img Upper Uni Right(DVT)  Result Date: 10/02/2022 CLINICAL DATA:  Upper extremity swelling. EXAM: RIGHT UPPER EXTREMITY VENOUS DOPPLER ULTRASOUND TECHNIQUE: Gray-scale sonography with graded compression, as well as color Doppler and duplex ultrasound were performed to evaluate the upper extremity deep venous system from the level of the subclavian vein and including the jugular, axillary, basilic, radial, ulnar and upper cephalic vein. Spectral Doppler was utilized to evaluate flow at rest and with distal augmentation maneuvers. COMPARISON:  Chest XR, 10/02/2019. FINDINGS: VENOUS Dialysis catheter within the imaged R IJV. Otherwise normal compressibility of the RIGHT internal jugular, subclavian, axillary, cephalic, basilic, brachial, radial and ulnar veins. No filling defects to suggest DVT on grayscale or color Doppler imaging. Doppler waveforms show normal direction of venous flow, normal respiratory plasticity and response to augmentation. Limited views of the contralateral subclavian vein are unremarkable. OTHER  RIGHT upper extremity arteriovenous fistula with enlargement of the RIGHT brachial vein and thrill within the axillary vein. No evidence of superficial thrombophlebitis or abnormal fluid collection. Limitations: Dressings overlying the RIGHT neck and chest limiting evaluation of the R IJV and venous confluence. IMPRESSION: Suboptimal evaluation, within these constraints; 1. No definitive evidence of DVT or superficial thrombophlebitis within the RIGHT upper extremity. 2. Ipsilateral (RIGHT) upper extremity fistula and dialysis catheter. Findings likely to represent the etiology of patient's reported upper extremity swelling. Electronically Signed   By: Michaelle Birks M.D.   On: 10/02/2022 10:39   DG CHEST PORT 1 VIEW  Result Date: 10/01/2022 CLINICAL DATA:  Shortness of breath. EXAM: PORTABLE CHEST 1 VIEW COMPARISON:  Chest x-ray 09/29/2022 FINDINGS: Right-sided central venous catheter tip projects over the cavoatrial junction, unchanged. The heart is enlarged, unchanged. There is central pulmonary vascular congestion. There is no focal lung consolidation, pleural effusion or pneumothorax. No acute fractures are seen. Cervical spinal fusion plate is again noted. IMPRESSION: Cardiomegaly with central pulmonary vascular congestion. Electronically Signed   By: Ronney Asters M.D.   On: 10/01/2022 16:28   Medications: Infusions:  cefTAZidime (FORTAZ)  IV     vancomycin 1,000 mg (10/01/22 1238)    Scheduled Medications:  amiodarone  200 mg Oral BID   apixaban  5 mg Oral BID   arformoterol  15 mcg Nebulization BID   aspirin EC  81 mg Oral Daily   atenolol  50 mg Oral QHS   budesonide (PULMICORT) nebulizer solution  0.25 mg Nebulization BID   Chlorhexidine Gluconate Cloth  6 each Topical Daily   Chlorhexidine Gluconate Cloth  6 each Topical Q0600   Chlorhexidine Gluconate Cloth  6 each Topical Q0600   diltiazem  240 mg Oral Daily   DULoxetine  30 mg Oral QPM   insulin aspart  0-20 Units Subcutaneous  TID WC   insulin aspart  0-5 Units Subcutaneous QHS   insulin aspart  5 Units Subcutaneous TID WC   insulin detemir  20 Units Subcutaneous QHS   ipratropium-albuterol  3 mL Nebulization Q6H WA   lanthanum  500 mg Oral TID WC   Living Better with Heart Failure Book   Does not apply Once   mupirocin ointment   Nasal BID   polyethylene glycol  17 g Oral Daily   predniSONE  40 mg Oral Q breakfast   rosuvastatin  5 mg Oral Daily    have reviewed scheduled and prn medications.  Physical Exam: General:  obese, NAD Heart: RRR  Lungs: mostly clear Abdomen: obese, soft, non tender Extremities: minimal edema  Dialysis Access: TDC and right upper AVG-  good thirll and bruit-  area that is draining does not impact her new AVG-  Dr. Donnetta Hutching has seen    10/03/2022,8:07 AM  LOS: 4 days

## 2022-10-03 NOTE — Evaluation (Signed)
Physical Therapy Evaluation Patient Details Name: [name] MRN: [id number] DOB: 1951-07-14 Today's Date: 10/03/2022  History of Present Illness  [name] is a 71 y.o. female with medical history significant of congestive heart failure, anemia, anxiety, coronary artery disease, COPD, diabetic neuropathy, type 2 diabetes mellitus, GERD, hyperlipidemia, hypertension, and more presents ED with a chief complaint of chest pain.  Patient reports she has had a pressure-like pain.  Is been ongoing intermittently for a while.  Today it was worse than normal.  She has been coughing, and feels like there is sputum to bring up, but cannot get it up.  When she does get up its either white and frothy or yellow in thick mucus.  Patient reports her chest pain for this episode started 2 days ago.  She was laying down when it started.  Laying flat makes it worse.  Exertion makes it worse.  Nothing makes it better.  Patient reports she did not feel better in the ER.  At home she has felt minimal improvement after using bronchodilators.  Patient reports at home she is on 2 L nasal cannula at night.  The oxygen supplementation also helps her chest pressure.  Coughing makes her chest pressure worse.  She has had nausea but no vomiting.  She has had a decreased appetite with abdominal pain that is vague but seems to be located below her umbilicus.  She does not have any diarrhea.  She is chronically constipated and has had no change to bowel habits.  Her last bowel movement was today.  Before that it was 3 days before that.  Patient reports it is normal for her to either go every other day or every 3 days.  Patient is unsure if she has blood in her stool because she is on an iron supplement.  Patient reports she does see blood in her urine at times.  She reports that it is bright red and changes to the color of the commode.  She produces urine about twice per day.  Patient has no other complaints at this time.    Clinical Impression  Patient presents in chair (assisted by nursing staff) and agreeable for therapy. Patient able to transfer to bed w/o AD and perform bed mobility with increased time. Patient able to ambulate in room and hallway with RW, limited mostly due to fatigue. Patient's SpO2 at 100% at rest, 95% with activity, and returned to 100% upon resting following ambulation. Patient tolerated staying up in chair after therapy. Patient will benefit from continued skilled physical therapy in hospital and recommended venue below to increase strength, balance, endurance for safe ADLs and gait.     Recommendations for follow up therapy are one component of a multi-disciplinary discharge planning process, led by the attending physician.  Recommendations may be updated based on patient status, additional functional criteria and insurance authorization.  Follow Up Recommendations Home health PT      Assistance Recommended at Discharge Set up Supervision/Assistance  Patient can return home with the following  A little help with walking and/or transfers;A little help with bathing/dressing/bathroom;Assistance with cooking/housework;Help with stairs or ramp for entrance    Equipment Recommendations None recommended by PT  Recommendations for Other Services       Functional Status Assessment Patient has had a recent decline in their functional status and demonstrates the ability to make significant improvements in function in a reasonable and predictable amount of time.     Precautions / Restrictions Precautions Precautions:  Fall Restrictions Weight Bearing Restrictions: No      Mobility  Bed Mobility Overal bed mobility: Modified Independent             General bed mobility comments: increased time, slightly labored movement    Transfers   Equipment used: Rolling walker (2 wheels), None               General transfer comment: RW for transfer to chair, no AD for transfer to  bedside commode at end of session    Ambulation/Gait Ambulation/Gait assistance: Supervision Gait Distance (Feet): 40 Feet Assistive device: Rolling walker (2 wheels) Gait Pattern/deviations: Decreased step length - right, Decreased step length - left, Decreased stride length Gait velocity: decreased     General Gait Details: Patient able to ambulate in hallway with RW limited mostly due to fatigue, patient SpO2 100% on room air with rest, decreased to 95% with activity, increased to 100% following ambulation, pt left on room air  Stairs            Wheelchair Mobility    Modified Rankin (Stroke Patients Only)       Balance Overall balance assessment: Needs assistance Sitting-balance support: No upper extremity supported, Feet supported Sitting balance-Leahy Scale: Good Sitting balance - Comments: seated EOB   Standing balance support: Bilateral upper extremity supported, No upper extremity supported, During functional activity Standing balance-Leahy Scale: Good Standing balance comment: fair w/o AD, good with RW                             Pertinent Vitals/Pain Pain Assessment Pain Assessment: No/denies pain    Home Living Family/patient expects to be discharged to:: Private residence Living Arrangements: Parent Available Help at Discharge: Family;Available PRN/intermittently Type of Home: House Home Access: Stairs to enter Entrance Stairs-Rails: Right;Left Entrance Stairs-Number of Steps: 2   Home Layout: One level Home Equipment: Clinical biochemist (2 wheels);Cane - single point      Prior Function Prior Level of Function : Independent/Modified Independent;Driving             Mobility Comments: drives herself to dialysis, uses a cane and RW PRN ADLs Comments: Independent     Hand Dominance   Dominant Hand: Right    Extremity/Trunk Assessment   Upper Extremity Assessment Upper Extremity Assessment: Generalized  weakness    Lower Extremity Assessment Lower Extremity Assessment: Generalized weakness    Cervical / Trunk Assessment Cervical / Trunk Assessment: Normal  Communication   Communication: No difficulties  Cognition Arousal/Alertness: Awake/alert Behavior During Therapy: WFL for tasks assessed/performed Overall Cognitive Status: Within Functional Limits for tasks assessed                                          General Comments      Exercises     Assessment/Plan    PT Assessment Patient needs continued PT services  PT Problem List Decreased strength;Decreased activity tolerance;Decreased balance;Decreased mobility       PT Treatment Interventions DME instruction;Balance training;Gait training;Stair training;Functional mobility training;Patient/family education;Therapeutic activities;Therapeutic exercise    PT Goals (Current goals can be found in the Care Plan section)  Acute Rehab PT Goals Patient Stated Goal: return home PT Goal Formulation: With patient Time For Goal Achievement: 10/10/22 Potential to Achieve Goals: Good    Frequency Min 3X/week  Co-evaluation               AM-PAC PT "6 Clicks" Mobility  Outcome Measure Help needed turning from your back to your side while in a flat bed without using bedrails?: None Help needed moving from lying on your back to sitting on the side of a flat bed without using bedrails?: None Help needed moving to and from a bed to a chair (including a wheelchair)?: None Help needed standing up from a chair using your arms (e.g., wheelchair or bedside chair)?: None Help needed to walk in hospital room?: A Little Help needed climbing 3-5 steps with a railing? : A Little 6 Click Score: 22    End of Session   Activity Tolerance: Patient tolerated treatment well;Patient limited by fatigue Patient left: in chair;with call bell/phone within reach Nurse Communication: Mobility status PT Visit Diagnosis:  Unsteadiness on feet (R26.81);Other abnormalities of gait and mobility (R26.89);Muscle weakness (generalized) (M62.81)    Time: 0383-3383 PT Time Calculation (min) (ACUTE ONLY): 18 min   Charges:   PT Evaluation $PT Eval Low Complexity: 1 Low          Zigmund Gottron, SPT

## 2022-10-03 NOTE — Progress Notes (Signed)
Patient ID: [name], female   DOB: May 20, 1951, 71 y.o.   MRN: [id number] Patient seen as an inpatient at [facility] today.  Had a outpatient appointment with me tomorrow.  She has a right upper arm AV Gore-Tex graft placed by myself on 08/15/2022.  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  The fistula was ligated to the same surgery.  I have seen her several times in the office since the initial surgery.  She had continuous blistering and drainage of the hematoma from the nonfunctional fistula.  There did not appear to be any involvement of the Gore-Tex graft.  On my last visit with her, this appears to be resolving nicely.  She was admitted several days ago with complaints of chest pain and had been placed on antibiotics as an outpatient.  On physical exam today, her Gore-Tex graft has an excellent thrill.  It is lateral to the area of old fistula.  She has had continued nice resolution of the hematoma and the nonfunctional portion of her fistula.  I do not see any evidence of invasive infection or involvement of the Gore-Tex graft.  She is having excellent use of her tunneled catheter.  I would continue to use the catheter for an additional 2 weeks and then she should be able to use her AV graft.  We will cancel her outpatient visit with me tomorrow.  I will see her in the office in 3 weeks for continued follow-up

## 2022-10-03 NOTE — Progress Notes (Signed)
Patient received in chair alert and oriented, transferred patient back to bed no c/o pain or discomfort through the night, currently resting quietly with call bell in reach.

## 2022-10-03 NOTE — Plan of Care (Signed)
  Problem: Acute Rehab PT Goals(only PT should resolve) Goal: Pt Will Go Supine/Side To Sit Outcome: Progressing Flowsheets (Taken 10/03/2022 1352) Pt will go Supine/Side to Sit: Independently Goal: Patient Will Transfer Sit To/From Stand Outcome: Progressing Flowsheets (Taken 10/03/2022 1352) Patient will transfer sit to/from stand: Independently Goal: Pt Will Transfer Bed To Chair/Chair To Bed Outcome: Progressing Flowsheets (Taken 10/03/2022 1352) Pt will Transfer Bed to Chair/Chair to Bed: with modified independence Goal: Pt Will Ambulate Outcome: Progressing Flowsheets (Taken 10/03/2022 1352) Pt will Ambulate:  with modified independence  100 feet  with least restrictive assistive device   [employer], SPT

## 2022-10-04 ENCOUNTER — Ambulatory Visit: Payer: Medicare Other | Admitting: Vascular Surgery

## 2022-10-04 ENCOUNTER — Other Ambulatory Visit (HOSPITAL_COMMUNITY): Payer: Self-pay

## 2022-10-04 ENCOUNTER — Ambulatory Visit: Payer: Medicare Other | Admitting: Family Medicine

## 2022-10-04 DIAGNOSIS — I5033 Acute on chronic diastolic (congestive) heart failure: Secondary | ICD-10-CM | POA: Diagnosis not present

## 2022-10-04 LAB — CBC
HCT: 40.1 % (ref 36.0–46.0)
Hemoglobin: 12.9 g/dL (ref 12.0–15.0)
MCH: 27.4 pg (ref 26.0–34.0)
MCHC: 32.2 g/dL (ref 30.0–36.0)
MCV: 85.1 fL (ref 80.0–100.0)
Platelets: 323 10*3/uL (ref 150–400)
RBC: 4.71 MIL/uL (ref 3.87–5.11)
RDW: 19.9 % — ABNORMAL HIGH (ref 11.5–15.5)
WBC: 18.7 10*3/uL — ABNORMAL HIGH (ref 4.0–10.5)
nRBC: 0.2 % (ref 0.0–0.2)

## 2022-10-04 LAB — BASIC METABOLIC PANEL
Anion gap: 15 (ref 5–15)
BUN: 49 mg/dL — ABNORMAL HIGH (ref 8–23)
CO2: 26 mmol/L (ref 22–32)
Calcium: 9.8 mg/dL (ref 8.9–10.3)
Chloride: 92 mmol/L — ABNORMAL LOW (ref 98–111)
Creatinine, Ser: 7.04 mg/dL — ABNORMAL HIGH (ref 0.44–1.00)
GFR, Estimated: 6 mL/min — ABNORMAL LOW (ref >60)
Glucose, Bld: 179 mg/dL — ABNORMAL HIGH (ref 70–99)
Potassium: 4.6 mmol/L (ref 3.5–5.1)
Sodium: 133 mmol/L — ABNORMAL LOW (ref 135–145)

## 2022-10-04 LAB — GLUCOSE, CAPILLARY
Glucose-Capillary: 251 mg/dL — ABNORMAL HIGH (ref 70–99)
Glucose-Capillary: 346 mg/dL — ABNORMAL HIGH (ref 70–99)
Glucose-Capillary: 181 mg/dL — ABNORMAL HIGH (ref 70–99)
Glucose-Capillary: 295 mg/dL — ABNORMAL HIGH (ref 70–99)

## 2022-10-04 MED ORDER — PREDNISONE 20 MG PO TABS: 40.0000 mg | ORAL_TABLET | Freq: Every day | ORAL | 0 refills | Status: DC

## 2022-10-04 MED ORDER — IPRATROPIUM-ALBUTEROL 0.5-2.5 (3) MG/3ML IN SOLN: 3.0000 mL | Freq: Four times a day (QID) | RESPIRATORY_TRACT | Status: DC | PRN

## 2022-10-04 NOTE — Care Management Important Message (Signed)
Important Message  Patient Details  Name: [name] MRN: [id number] Date of Birth: [birth year]   Medicare Important Message Given:  Yes     [name] 10/04/2022, 11:54 AM

## 2022-10-04 NOTE — Progress Notes (Addendum)
Pharmacy Antibiotic Note  [name] is a 71 y.o. female admitted on 09/29/2022 with  dialysis graft infection . Patient ESRD T-Th-Sat and started on vancomycin and ceftazidime on 10/24 for 2 weeks. Pharmacy has been consulted to continue this dosing. Per vascular surgery, graft healing well w/o signs of infection. Per [facility], appears to have received an extra vanc dose on Sunday 10/29.  Plan: Check pre-dialysis level tomorrow and adjust vancomycin accordingly Continue Ceftazidime 1gm q TTHS w/ HD F/u length of therapy  Height: 5\' 3"  (160 cm) Weight: 79.4 kg (175 lb 1.6 oz) IBW/kg (Calculated) : 52.4  Temp (24hrs), Avg:97.9 F (36.6 C), Min:97.6 F (36.4 C), Max:98.4 F (36.9 C)  Recent Labs  Lab 09/29/22 1728 09/30/22 0403 10/02/22 0346 10/03/22 0410 10/04/22 0459  WBC 15.6* 14.2*  --   --  18.7*  CREATININE 7.35* 7.82* 8.21* 9.51* 7.04*     Estimated Creatinine Clearance: 7.4 mL/min (A) (by C-G formula based on SCr of 7.04 mg/dL (H)).    Allergies  Allergen Reactions   Azithromycin     Prolonged QT   Ace Inhibitors Cough   Clopidogrel Bisulfate Other (See Comments)    Sick, Headache, "Felt terrible"   Codeine Other (See Comments)    hallucinations   Lisinopril Cough   Penicillins Other (See Comments)    Unknown childhood reaction Did it involve swelling of the face/tongue/throat, SOB, or low BP? Unknown Did it involve sudden or severe rash/hives, skin peeling, or any reaction on the inside of your mouth or nose? Unknown Did you need to seek medical attention at a hospital or doctor's office? Unknown When did it last happen?      childhood allergy If all above answers are "NO", may proceed with cephalosporin use.     Antimicrobials this admission: Vanco 10/24 >> Ceftazidime 10/24 >>   Microbiology results: 10/27 Sputum - final: normal resp flora 10/27 MRSA PCR: positive  Thank you for allowing pharmacy to be a part of this patient's care.  Lorenso Courier [facility] 10/04/2022 8:33 AM

## 2022-10-04 NOTE — Discharge Summary (Signed)
Physician Discharge Summary  LEEANN BADY HYW:[id number] DOB: June 27, 1951 DOA: 09/29/2022  PCP: [name], MD  Admit date: 09/29/2022  Discharge date: 10/04/2022  Admitted From:Home  Disposition:  Home  Recommendations for Outpatient Follow-up:  Follow up with PCP in 1-2 weeks Continue routine hemodialysis Tuesday, Thursday, Saturday with next session 11/2 Continue antibiotics during hemodialysis with vancomycin and Tressie Ellis as previously scheduled to complete total 2-week course of treatment and this was initiated 10/24 Okay to resume use of AV graft in the next 2 weeks Continue prednisone as prescribed for several more days to complete course of treatment for COPD exacerbation and patient has breathing treatments available at home as needed  Home Health: None  Equipment/Devices: Wears oxygen at night  Discharge Condition:Stable  CODE STATUS: Full  Diet recommendation: Heart Healthy/carb modified  Brief/Interim Summary:  71 year old female with a history of end-stage renal disease on hemodialysis, diastolic heart failure, atrial fibrillation, admitted to the hospital with shortness of breath.  Found to be in volume overload.  Nephrology consulted for dialysis needs and underwent hemodialysis during the stay.  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  She was also noted to have some COPD exacerbation and was being treated with bronchodilators as well as prednisone and continues to have some minimal wheezing, but is otherwise stable for discharge and on room air.  She has had hemodialysis 10/31 with no acute events or concerns and is in stable condition for discharge today.  Discharge Diagnoses:  Principal Problem:   CHF exacerbation ([place]) Active Problems:   ESRD (end stage renal disease) on dialysis ([place])   Type 2 diabetes mellitus with hyperlipidemia  ([place])   Coronary artery disease of native artery of native heart with stable angina pectoris (HCC)   Prolonged Q-T interval on ECG   Anemia of chronic disease/ESRD   Leukocytosis   Hypokalemia   Dyslipidemia   Acute respiratory failure with hypoxia (HCC)   Paroxysmal atrial fibrillation (HCC)   Volume overload  Principal discharge diagnosis: Dyspnea secondary to acute on chronic diastolic CHF exacerbation as well as acute COPD exacerbation.  Discharge Instructions  Discharge Instructions     Diet - low sodium heart healthy   Complete by: As directed    Increase activity slowly   Complete by: As directed    No wound care   Complete by: As directed       Allergies as of 10/04/2022       Reactions   Azithromycin    Prolonged QT   Ace Inhibitors Cough   Clopidogrel Bisulfate Other (See Comments)   Sick, Headache, "Felt terrible"   Codeine Other (See Comments)   hallucinations   Lisinopril Cough   Penicillins Other (See Comments)   Unknown childhood reaction Did it involve swelling of the face/tongue/throat, SOB, or low BP? Unknown Did it involve sudden or severe rash/hives, skin peeling, or any reaction on the inside of your mouth or nose? Unknown Did you need to seek medical attention at a hospital or doctor's office? Unknown When did it last happen?      childhood allergy If all above answers are "NO", may proceed with cephalosporin use.        Medication List     TAKE these medications    acetaminophen 500 MG tablet Commonly known as: TYLENOL Take 1,000 mg by mouth every 6 (six) hours as needed  for moderate pain or headache.   albuterol (2.5 MG/3ML) 0.083% nebulizer solution Commonly known as: PROVENTIL Take 3 mLs (2.5 mg total) by nebulization every 6 (six) hours as needed for wheezing or shortness of breath.   amiodarone 200 MG tablet Commonly known as: PACERONE Take 1 tablet by mouth twice a day for 2 weeks then reduce to 1 tablet daily What changed:   how much to take how to take this when to take this additional instructions   apixaban 5 MG Tabs tablet Commonly known as: ELIQUIS Take 1 tablet (5 mg total) by mouth 2 (two) times daily.   aspirin EC 81 MG tablet Take 1 tablet (81 mg total) by mouth daily. Swallow whole.   atenolol 100 MG tablet Commonly known as: TENORMIN Take 0.5 tablets (50 mg total) by mouth at bedtime.   budesonide-formoterol 160-4.5 MCG/ACT inhaler Commonly known as: SYMBICORT Inhale 2 puffs into the lungs 2 (two) times daily. What changed:  when to take this reasons to take this   cetirizine 10 MG tablet Commonly known as: ZYRTEC Take 1 tablet (10 mg total) by mouth daily as needed for allergies. What changed: when to take this   diltiazem 240 MG 24 hr capsule Commonly known as: CARDIZEM CD Take 1 capsule (240 mg total) by mouth daily.   DULoxetine 30 MG capsule Commonly known as: CYMBALTA TAKE 1 CAPSULE BY MOUTH ONCE DAILY WITH SUPPER What changed: See the new instructions.   Ferrous Fumarate 324 (106 Fe) MG Tabs tablet Commonly known as: HEMOCYTE - 106 mg FE Take 1 tablet (106 mg of iron total) by mouth daily.   FreeStyle Libre 2 Reader Kerrin Mo USE TO TEST BLOOD SUGAR CONTINUOUSLY   FreeStyle Libre 2 Sensor Misc Use multiple times daily to track glucose to prevent highs and lows as a complication of dialysis Dx:Z99.2, E11.59   gabapentin 400 MG capsule Commonly known as: NEURONTIN TAKE 2 CAPSULES AFTER EACH DIALYSIS SESSION What changed: See the new instructions.   lanthanum 750 MG chewable tablet Commonly known as: FOSRENOL SMARTSIG:2 Tablet(s) By Mouth   LUBRICATING EYE DROPS OP Place 1 drop into both eyes daily as needed (dry eyes).   multivitamin Tabs tablet Take 1 tablet by mouth daily.   nitroGLYCERIN 0.4 MG SL tablet Commonly known as: NITROSTAT Place 1 tablet (0.4 mg total) under the tongue every 5 (five) minutes as needed for chest pain.   NovoLOG FlexPen 100 UNIT/ML  FlexPen Generic drug: insulin aspart Inject 1 Units into the skin 3 (three) times daily with meals. < 150: 0 units. 150-199:1 unit. 200-249: 2 units. 250-299: 3 units. 300-349: 4 units. Over 350: 5 units   ondansetron 4 MG tablet Commonly known as: ZOFRAN TAKE 1 TABLET BY MOUTH EVERY 8 HOURS AS NEEDED FOR NAUSEA AND VOMITING What changed: See the new instructions.   OneTouch Ultra test strip Generic drug: glucose blood Use to test blood sugar 3 times daily as directed. DX: E11.9   Ozempic (0.25 or 0.5 MG/DOSE) 2 MG/1.5ML Sopn Generic drug: Semaglutide(0.25 or 0.5MG /DOS) Inject 0.5 mg into the skin every 7 (seven) days. Saturday   polyethylene glycol 17 g packet Commonly known as: MIRALAX / GLYCOLAX Take 17 g by mouth daily as needed for moderate constipation or mild constipation.   predniSONE 20 MG tablet Commonly known as: DELTASONE Take 2 tablets (40 mg total) by mouth daily with breakfast for 5 days. Start taking on: October 05, 2022   rOPINIRole 1 MG tablet Commonly known as:  REQUIP Take 1 mg by mouth at bedtime.   rosuvastatin 5 MG tablet Commonly known as: Crestor Take 1 tablet (5 mg total) by mouth daily. For cholesterol   torsemide 20 MG tablet Commonly known as: DEMADEX Take 20 mg by mouth daily.   Tyler Aas FlexTouch 100 UNIT/ML FlexTouch Pen Generic drug: insulin degludec Inject 20 Units into the skin at bedtime. Via [employer] patient assistance program What changed: how much to take        Follow-up Information     [name], [name], MD. Schedule an appointment as soon as possible for a visit in 1 week(s).   Specialty: Family Medicine Contact information: [place] [address] [phone]                Allergies  Allergen Reactions   Azithromycin     Prolonged QT   Ace Inhibitors Cough   Clopidogrel Bisulfate Other (See Comments)    Sick, Headache, "Felt terrible"   Codeine Other (See Comments)    hallucinations    Lisinopril Cough   Penicillins Other (See Comments)    Unknown childhood reaction Did it involve swelling of the face/tongue/throat, SOB, or low BP? Unknown Did it involve sudden or severe rash/hives, skin peeling, or any reaction on the inside of your mouth or nose? Unknown Did you need to seek medical attention at a hospital or doctor's office? Unknown When did it last happen?      childhood allergy If all above answers are "NO", may proceed with cephalosporin use.      Consultations: Nephrology Vascular surgery   Procedures/Studies: US Venous Img Upper Uni Right(DVT)  Result Date: 10/02/2022 CLINICAL DATA:  Upper extremity swelling. EXAM: RIGHT UPPER EXTREMITY VENOUS DOPPLER ULTRASOUND TECHNIQUE: Gray-scale sonography with graded compression, as well as color Doppler and duplex ultrasound were performed to evaluate the upper extremity deep venous system from the level of the subclavian vein and including the jugular, axillary, basilic, radial, ulnar and upper cephalic vein. Spectral Doppler was utilized to evaluate flow at rest and with distal augmentation maneuvers. COMPARISON:  Chest XR, 10/02/2019. FINDINGS: VENOUS Dialysis catheter within the imaged R IJV. Otherwise normal compressibility of the RIGHT internal jugular, subclavian, axillary, cephalic, basilic, brachial, radial and ulnar veins. No filling defects to suggest DVT on grayscale or color Doppler imaging. Doppler waveforms show normal direction of venous flow, normal respiratory plasticity and response to augmentation. Limited views of the contralateral subclavian vein are unremarkable. OTHER RIGHT upper extremity arteriovenous fistula with enlargement of the RIGHT brachial vein and thrill within the axillary vein. No evidence of superficial thrombophlebitis or abnormal fluid collection. Limitations: Dressings overlying the RIGHT neck and chest limiting evaluation of the R IJV and venous confluence. IMPRESSION: Suboptimal  evaluation, within these constraints; 1. No definitive evidence of DVT or superficial thrombophlebitis within the RIGHT upper extremity. 2. Ipsilateral (RIGHT) upper extremity fistula and dialysis catheter. Findings likely to represent the etiology of patient's reported upper extremity swelling. Electronically Signed   By: Michaelle Birks M.D.   On: 10/02/2022 10:39   DG CHEST PORT 1 VIEW  Result Date: 10/01/2022 CLINICAL DATA:  Shortness of breath. EXAM: PORTABLE CHEST 1 VIEW COMPARISON:  Chest x-ray 09/29/2022 FINDINGS: Right-sided central venous catheter tip projects over the cavoatrial junction, unchanged. The heart is enlarged, unchanged. There is central pulmonary vascular congestion. There is no focal lung consolidation, pleural effusion or pneumothorax. No acute fractures are seen. Cervical spinal fusion plate is again noted. IMPRESSION: Cardiomegaly with  central pulmonary vascular congestion. Electronically Signed   By: Ronney Asters M.D.   On: 10/01/2022 16:28   ECHOCARDIOGRAM COMPLETE  Result Date: 09/30/2022    ECHOCARDIOGRAM REPORT   Patient Name:   IKRAN PATMAN Date of Exam: 09/30/2022 Medical Rec #:  [id number]       Height:       63.0 in Accession #:    [id number]      Weight:       178.1 lb Date of Birth:  September 06, 1951       BSA:          1.841 m Patient Age:    71 years        BP:           164/50 mmHg Patient Gender: F               HR:           82 bpm. Exam Location:  [facility] Procedure: 2D Echo, Cardiac Doppler and Color Doppler Indications:    Congestive Heart Failure I50.9  History:        Patient has prior history of Echocardiogram examinations, most                 recent 03/05/2022. CHF, CAD and Previous Myocardial Infarction,                 COPD, Arrythmias:Atrial Fibrillation; Risk Factors:Hypertension,                 Diabetes and Dyslipidemia. ESRD (end stage renal disease) on                 dialysis.  Sonographer:    Alvino Chapel RCS Referring Phys: [id number] ASIA B  [place]  1. Left ventricular ejection fraction, by estimation, is 55 to 60%. The left ventricle has normal function. Left ventricular endocardial border not optimally defined to evaluate regional wall motion. There is moderate asymmetric left ventricular hypertrophy of the lateral segment. Left ventricular diastolic parameters are indeterminate. There is the interventricular septum is flattened in diastole ('D' shaped left ventricle), consistent with right ventricular volume overload and the interventricular septum is flattened in systole, consistent with right ventricular pressure overload.  2. Right ventricular systolic function is mildly reduced. The right ventricular size is normal. There is mildly elevated pulmonary artery systolic pressure. The estimated right ventricular systolic pressure is 02.4 mmHg.  3. Left atrial size was moderately dilated.  4. There is no evidence of cardiac tamponade.  5. The mitral valve is degenerative. Mild mitral valve regurgitation. No evidence of mitral stenosis.  6. The aortic valve is tricuspid. There is mild calcification of the aortic valve. Aortic valve regurgitation is not visualized. Aortic valve sclerosis is present, with no evidence of aortic valve stenosis. Comparison(s): Slight increase in RVSP from 2022. FINDINGS  Left Ventricle: Left ventricular ejection fraction, by estimation, is 55 to 60%. The left ventricle has normal function. Left ventricular endocardial border not optimally defined to evaluate regional wall motion. The left ventricular internal cavity size was normal in size. There is moderate asymmetric left ventricular hypertrophy of the lateral segment. The interventricular septum is flattened in diastole ('D' shaped left ventricle), consistent with right ventricular volume overload and the interventricular septum is flattened in systole, consistent with right ventricular pressure overload. Left ventricular diastolic parameters are  indeterminate. Right Ventricle: The right ventricular size is normal. No increase in right ventricular wall thickness. Right ventricular systolic function  is mildly reduced. There is mildly elevated pulmonary artery systolic pressure. The tricuspid regurgitant velocity  is 2.66 m/s, and with an assumed right atrial pressure of 15 mmHg, the estimated right ventricular systolic pressure is 00.9 mmHg. Left Atrium: Left atrial size was moderately dilated. Right Atrium: Right atrial size was normal in size. Pericardium: Trivial pericardial effusion is present. The pericardial effusion is circumferential. There is no evidence of cardiac tamponade. Mitral Valve: The mitral valve is degenerative in appearance. Mild mitral valve regurgitation. No evidence of mitral valve stenosis. Tricuspid Valve: The tricuspid valve is normal in structure. Tricuspid valve regurgitation is mild . No evidence of tricuspid stenosis. Aortic Valve: The aortic valve is tricuspid. There is mild calcification of the aortic valve. There is mild aortic valve annular calcification. Aortic valve regurgitation is not visualized. Aortic valve sclerosis is present, with no evidence of aortic valve stenosis. Pulmonic Valve: The pulmonic valve was normal in structure. Pulmonic valve regurgitation is not visualized. No evidence of pulmonic stenosis. Aorta: The aortic root is normal in size and structure. IAS/Shunts: No atrial level shunt detected by color flow Doppler.  LEFT VENTRICLE PLAX 2D LVIDd:         4.50 cm LVIDs:         2.90 cm LV PW:         1.50 cm LV IVS:        1.20 cm LVOT diam:     1.50 cm LV SV:         29 LV SV Index:   16 LVOT Area:     1.77 cm  RIGHT VENTRICLE TAPSE (M-mode): 1.3 cm LEFT ATRIUM             Index        RIGHT ATRIUM           Index LA diam:        4.70 cm 2.55 cm/m   RA Area:     20.70 cm LA Vol (A2C):   86.9 ml 47.21 ml/m  RA Volume:   57.70 ml  31.35 ml/m LA Vol (A4C):   68.0 ml 36.94 ml/m LA Biplane Vol: 77.1 ml  41.88 ml/m  AORTIC VALVE LVOT Vmax:   93.63 cm/s LVOT Vmean:  58.233 cm/s LVOT VTI:    0.166 m  AORTA Ao Root diam: 3.00 cm MITRAL VALVE                TRICUSPID VALVE MV Area (PHT): 4.80 cm     TR Peak grad:   28.3 mmHg MV Decel Time: 158 msec     TR Vmax:        266.00 cm/s MV E velocity: 140.00 cm/s MV A velocity: 45.10 cm/s   SHUNTS MV E/A ratio:  3.10         Systemic VTI:  0.17 m                             Systemic Diam: 1.50 cm Rudean Haskell MD Electronically signed by Rudean Haskell MD Signature Date/Time: 09/30/2022/1:37:40 PM    Final    DG Chest 2 View  Result Date: 09/29/2022 CLINICAL DATA:  Chest pain EXAM: CHEST - 2 VIEW COMPARISON:  Chest x-ray dated August 15, 2022; chest CT dated May 15, 2022 FINDINGS: Unchanged cardiomegaly. Mild diffuse bilateral interstitial opacities. Linear opacity of the right lower lobe, likely due to atelectasis. Indeterminate focal nodular opacities seen in the  right lower lobe, new when compared with prior chest x-ray. No large pleural effusion or pneumothorax. Unchanged position of right-sided dialysis catheter. IMPRESSION: 1. Mild diffuse bilateral interstitial opacities, likely due to pulmonary edema. 2. Indeterminate focal nodular opacity seen in the right lower lobe, possibly atelectasis, although given nodules were present on prior chest CT dated May 15, 2022. Recommend further evaluation with repeat chest CT to exclude enlargement. Electronically Signed   By: Yetta Glassman M.D.   On: 09/29/2022 17:53     Discharge Exam: Vitals:   10/04/22 0739 10/04/22 0754  BP:  (!) 160/75  Pulse:  76  Resp:    Temp:    SpO2: 96%    Vitals:   10/04/22 0354 10/04/22 0500 10/04/22 0739 10/04/22 0754  BP: (!) 159/74   (!) 160/75  Pulse: 84   76  Resp: 16     Temp: 97.7 F (36.5 C)     TempSrc: Oral     SpO2: 100%  96%   Weight:  79.4 kg    Height:        General: Pt is alert, awake, not in acute distress Cardiovascular: RRR, S1/S2  +, no rubs, no gallops Respiratory: CTA bilaterally, no wheezing, no rhonchi Abdominal: Soft, NT, ND, bowel sounds + Extremities: no edema, no cyanosis    The results of significant diagnostics from this hospitalization (including imaging, microbiology, ancillary and laboratory) are listed below for reference.     Microbiology: Recent Results (from the past 240 hour(s))  Resp Panel by RT-PCR (Flu A&B, Covid) Anterior Nasal Swab     Status: None   Collection Time: 09/29/22  5:55 PM   Specimen: Anterior Nasal Swab  Result Value Ref Range Status   SARS Coronavirus 2 by RT PCR NEGATIVE NEGATIVE Final    Comment: (NOTE) SARS-CoV-2 target nucleic acids are NOT DETECTED.  The SARS-CoV-2 RNA is generally detectable in upper respiratory specimens during the acute phase of infection. The lowest concentration of SARS-CoV-2 viral copies this assay can detect is 138 copies/mL. A negative result does not preclude SARS-Cov-2 infection and should not be used as the sole basis for treatment or other patient management decisions. A negative result may occur with  improper specimen collection/handling, submission of specimen other than nasopharyngeal swab, presence of viral mutation(s) within the areas targeted by this assay, and inadequate number of viral copies(<138 copies/mL). A negative result must be combined with clinical observations, patient history, and epidemiological information. The expected result is Negative.  Fact Sheet for Patients:  EntrepreneurPulse.com.au  Fact Sheet for Healthcare Providers:  IncredibleEmployment.be  This test is no t yet approved or cleared by the [place] FDA and  has been authorized for detection and/or diagnosis of SARS-CoV-2 by FDA under an Emergency Use Authorization (EUA). This EUA will remain  in effect (meaning this test can be used) for the duration of the COVID-19 declaration under Section 564(b)(1) of the  Act, 21 U.S.C.section 360bbb-3(b)(1), unless the authorization is terminated  or revoked sooner.       Influenza A by PCR NEGATIVE NEGATIVE Final   Influenza B by PCR NEGATIVE NEGATIVE Final    Comment: (NOTE) The Xpert Xpress SARS-CoV-2/FLU/RSV plus assay is intended as an aid in the diagnosis of influenza from Nasopharyngeal swab specimens and should not be used as a sole basis for treatment. Nasal washings and aspirates are unacceptable for Xpert Xpress SARS-CoV-2/FLU/RSV testing.  Fact Sheet for Patients: EntrepreneurPulse.com.au  Fact Sheet for Healthcare Providers: IncredibleEmployment.be  This  test is not yet approved or cleared by the [place] and has been authorized for detection and/or diagnosis of SARS-CoV-2 by FDA under an Emergency Use Authorization (EUA). This EUA will remain in effect (meaning this test can be used) for the duration of the COVID-19 declaration under Section 564(b)(1) of the Act, 21 U.S.C. section 360bbb-3(b)(1), unless the authorization is terminated or revoked.  Performed at [facility], [address], [place], [place] [address]   Culture, Respiratory w Gram Stain     Status: None   Collection Time: 09/29/22  7:55 PM   Specimen: SPU  Result Value Ref Range Status   Specimen Description   Final    SPUTUM Performed at [facility], [address], [place], [place] [address]    Special Requests   Final    NONE Performed at [facility], [address], [place], [place] [address]    Gram Stain   Final    FEW WBC PRESENT, PREDOMINANTLY MONONUCLEAR FEW GRAM NEGATIVE RODS FEW GRAM POSITIVE COCCI IN PAIRS FEW SQUAMOUS EPITHELIAL CELLS PRESENT    Culture   Final    MODERATE Normal respiratory flora-no Staph aureus or Pseudomonas seen Performed at [facility], [address], [place], [place] [address]    Report Status 10/02/2022 FINAL  Final  MRSA Next Gen by PCR, Nasal     Status: Abnormal    Collection Time: 09/29/22 11:29 PM   Specimen: Nasal Mucosa; Nasal Swab  Result Value Ref Range Status   MRSA by PCR Next Gen DETECTED (A) NOT DETECTED Final    Comment: RESULT CALLED TO, READ BACK BY AND VERIFIED WITH: Tilley L @ [id number] ON [id number] BY HENDERSON L (NOTE) The GeneXpert MRSA Assay (FDA approved for NASAL specimens only), is one component of a comprehensive MRSA colonization surveillance program. It is not intended to diagnose MRSA infection nor to guide or monitor treatment for MRSA infections. Test performance is not FDA approved in patients less than 26 years old. Performed at [facility], [address], [place], [place] [address]      Labs: BNP (last 3 results) Recent Labs    02/28/22 2351 03/26/22 1407 09/29/22 1800  BNP 1,695.0* 2,276.0* >6,568.1*   Basic Metabolic Panel: Recent Labs  Lab 09/29/22 1728 09/30/22 0403 10/02/22 0346 10/03/22 0410 10/04/22 0459  NA 139 139 131* 129* 133*  K 3.4* 3.3* 4.1 4.3 4.6  CL 99 98 95* 91* 92*  CO2 25 24 20* 21* 26  GLUCOSE 193* 179* 303* 179* 179*  BUN 15 21 34* 61* 49*  CREATININE 7.35* 7.82* 8.21* 9.51* 7.04*  CALCIUM 9.9 9.9 9.8 10.2 9.8  MG  --  2.3  --   --   --   PHOS  --  7.6* 5.8* 6.3*  --    Liver Function Tests: Recent Labs  Lab 09/30/22 0403 10/02/22 0346 10/03/22 0410  AST 31  --   --   ALT 26  --   --   ALKPHOS 63  --   --   BILITOT 1.0  --   --   PROT 6.8  --   --   ALBUMIN 3.7 3.6 3.5   No results for input(s): "LIPASE", "AMYLASE" in the last 168 hours. No results for input(s): "AMMONIA" in the last 168 hours. CBC: Recent Labs  Lab 09/29/22 1728 09/30/22 0403 10/04/22 0459  WBC 15.6* 14.2* 18.7*  NEUTROABS  --  10.5*  --   HGB 11.7* 11.5*  12.9  HCT 36.7 36.5 40.1  MCV 87.6 88.6 85.1  PLT 249 216 323   Cardiac Enzymes: No results for input(s): "CKTOTAL", "CKMB", "CKMBINDEX", "TROPONINI" in the last 168 hours. BNP: Invalid input(s): "POCBNP" CBG: Recent Labs  Lab  10/02/22 1127 10/02/22 1649 10/02/22 2059 10/03/22 0757 10/04/22 0731  GLUCAP 332* 236* 162* 195* 181*   D-Dimer No results for input(s): "DDIMER" in the last 72 hours. Hgb A1c No results for input(s): "HGBA1C" in the last 72 hours. Lipid Profile No results for input(s): "CHOL", "HDL", "LDLCALC", "TRIG", "CHOLHDL", "LDLDIRECT" in the last 72 hours. Thyroid function studies No results for input(s): "TSH", "T4TOTAL", "T3FREE", "THYROIDAB" in the last 72 hours.  Invalid input(s): "FREET3" Anemia work up No results for input(s): "VITAMINB12", "FOLATE", "FERRITIN", "TIBC", "IRON", "RETICCTPCT" in the last 72 hours. Urinalysis    Component Value Date/Time   COLORURINE YELLOW 04/25/2022 1844   APPEARANCEUR Cloudy (A) 06/28/2022 1054   LABSPEC 1.011 04/25/2022 1844   PHURINE 7.0 04/25/2022 1844   GLUCOSEU Negative 06/28/2022 1054   HGBUR LARGE (A) 04/25/2022 1844   BILIRUBINUR Negative 06/28/2022 [place] 04/25/2022 1844   PROTEINUR 3+ (A) 06/28/2022 1054   PROTEINUR 100 (A) 04/25/2022 1844   UROBILINOGEN 0.2 01/03/2012 1650   NITRITE Negative 06/28/2022 1054   NITRITE NEGATIVE 04/25/2022 1844   LEUKOCYTESUR 2+ (A) 06/28/2022 1054   LEUKOCYTESUR TRACE (A) 04/25/2022 1844   Sepsis Labs Recent Labs  Lab 09/29/22 1728 09/30/22 0403 10/04/22 0459  WBC 15.6* 14.2* 18.7*   Microbiology Recent Results (from the past 240 hour(s))  Resp Panel by RT-PCR (Flu A&B, Covid) Anterior Nasal Swab     Status: None   Collection Time: 09/29/22  5:55 PM   Specimen: Anterior Nasal Swab  Result Value Ref Range Status   SARS Coronavirus 2 by RT PCR NEGATIVE NEGATIVE Final    Comment: (NOTE) SARS-CoV-2 target nucleic acids are NOT DETECTED.  The SARS-CoV-2 RNA is generally detectable in upper respiratory specimens during the acute phase of infection. The lowest concentration of SARS-CoV-2 viral copies this assay can detect is 138 copies/mL. A negative result does not  preclude SARS-Cov-2 infection and should not be used as the sole basis for treatment or other patient management decisions. A negative result may occur with  improper specimen collection/handling, submission of specimen other than nasopharyngeal swab, presence of viral mutation(s) within the areas targeted by this assay, and inadequate number of viral copies(<138 copies/mL). A negative result must be combined with clinical observations, patient history, and epidemiological information. The expected result is Negative.  Fact Sheet for Patients:  EntrepreneurPulse.com.au  Fact Sheet for Healthcare Providers:  IncredibleEmployment.be  This test is no t yet approved or cleared by the [place] FDA and  has been authorized for detection and/or diagnosis of SARS-CoV-2 by FDA under an Emergency Use Authorization (EUA). This EUA will remain  in effect (meaning this test can be used) for the duration of the COVID-19 declaration under Section 564(b)(1) of the Act, 21 U.S.C.section 360bbb-3(b)(1), unless the authorization is terminated  or revoked sooner.       Influenza A by PCR NEGATIVE NEGATIVE Final   Influenza B by PCR NEGATIVE NEGATIVE Final    Comment: (NOTE) The Xpert Xpress SARS-CoV-2/FLU/RSV plus assay is intended as an aid in the diagnosis of influenza from Nasopharyngeal swab specimens and should not be used as a sole basis for treatment. Nasal washings and aspirates are unacceptable for Xpert Xpress SARS-CoV-2/FLU/RSV testing.  Fact  Sheet for Patients: EntrepreneurPulse.com.au  Fact Sheet for Healthcare Providers: IncredibleEmployment.be  This test is not yet approved or cleared by the [place] FDA and has been authorized for detection and/or diagnosis of SARS-CoV-2 by FDA under an Emergency Use Authorization (EUA). This EUA will remain in effect (meaning this test can be used) for the  duration of the COVID-19 declaration under Section 564(b)(1) of the Act, 21 U.S.C. section 360bbb-3(b)(1), unless the authorization is terminated or revoked.  Performed at [facility], [address] [place], [place] [address]   Culture, Respiratory w Gram Stain     Status: None   Collection Time: 09/29/22  7:55 PM   Specimen: SPU  Result Value Ref Range Status   Specimen Description   Final    SPUTUM Performed at [facility], [address], [place], [place] [address]    Special Requests   Final    NONE Performed at [facility], [address], [place], [place] [address]    Gram Stain   Final    FEW WBC PRESENT, PREDOMINANTLY MONONUCLEAR FEW GRAM NEGATIVE RODS FEW GRAM POSITIVE COCCI IN PAIRS FEW SQUAMOUS EPITHELIAL CELLS PRESENT    Culture   Final    MODERATE Normal respiratory flora-no Staph aureus or Pseudomonas seen Performed at [facility], [address] [place], [place] [address]    Report Status 10/02/2022 FINAL  Final  MRSA Next Gen by PCR, Nasal     Status: Abnormal   Collection Time: 09/29/22 11:29 PM   Specimen: Nasal Mucosa; Nasal Swab  Result Value Ref Range Status   MRSA by PCR Next Gen DETECTED (A) NOT DETECTED Final    Comment: RESULT CALLED TO, READ BACK BY AND VERIFIED WITH: Letts L @ [id number] ON [id number] BY HENDERSON L (NOTE) The GeneXpert MRSA Assay (FDA approved for NASAL specimens only), is one component of a comprehensive MRSA colonization surveillance program. It is not intended to diagnose MRSA infection nor to guide or monitor treatment for MRSA infections. Test performance is not FDA approved in patients less than 28 years old. Performed at [facility], [address] [place], [place] [address]      Time coordinating discharge: 35 minutes  SIGNED:   Rodena Goldmann, DO [facility] 10/04/2022, 11:08 AM  If 7PM-7AM, please contact night-coverage [email or web address]

## 2022-10-04 NOTE — Progress Notes (Signed)
Patient bed linen changed and repositioned for comfort, resting quietly with eyes closed, fall precautions in place and call bell in reach.

## 2022-10-04 NOTE — Progress Notes (Signed)
Patient ID: LAUREL HARNDEN, female   DOB: [birth year], 71 y.o.   MRN: [id number]  [facility] Progress Note    Subjective:   Complains of wheezing this morning.   Objective:   BP (!) 160/75 (BP Location: Left Arm)   Pulse 76   Temp 97.7 F (36.5 C) (Oral)   Resp 16   Ht 5\' 3"  (1.6 m)   Wt 79.4 kg   SpO2 96%   BMI 31.02 kg/m   Intake/Output: I/O last 3 completed shifts: In: 258 [P.O.:720; IV Piggyback:200] Out: 3500 [Other:3500]   Intake/Output this shift:  No intake/output data recorded. Weight change: 1.9 kg  Physical Exam: Gen: NAD CVS: RRR Resp: occ rhonchi and wheezes bilaterally Abd: +BS, soft, NT/ND Ext: no edema, RUE AVG +T/B, wrapped  Labs: BMET Recent Labs  Lab 09/29/22 1728 09/30/22 0403 10/02/22 0346 10/03/22 0410 10/04/22 0459  NA 139 139 131* 129* 133*  K 3.4* 3.3* 4.1 4.3 4.6  CL 99 98 95* 91* 92*  CO2 25 24 20* 21* 26  GLUCOSE 193* 179* 303* 179* 179*  BUN 15 21 34* 61* 49*  CREATININE 7.35* 7.82* 8.21* 9.51* 7.04*  ALBUMIN  --  3.7 3.6 3.5  --   CALCIUM 9.9 9.9 9.8 10.2 9.8  PHOS  --  7.6* 5.8* 6.3*  --    CBC Recent Labs  Lab 09/29/22 1728 09/30/22 0403 10/04/22 0459  WBC 15.6* 14.2* 18.7*  NEUTROABS  --  10.5*  --   HGB 11.7* 11.5* 12.9  HCT 36.7 36.5 40.1  MCV 87.6 88.6 85.1  PLT 249 216 323      Medications:     amiodarone  200 mg Oral BID   apixaban  5 mg Oral BID   arformoterol  15 mcg Nebulization BID   aspirin EC  81 mg Oral Daily   atenolol  50 mg Oral QHS   budesonide (PULMICORT) nebulizer solution  0.25 mg Nebulization BID   Chlorhexidine Gluconate Cloth  6 each Topical Daily   Chlorhexidine Gluconate Cloth  6 each Topical Q0600   Chlorhexidine Gluconate Cloth  6 each Topical Q0600   diltiazem  240 mg Oral Daily   DULoxetine  30 mg Oral QPM   insulin aspart  0-20 Units Subcutaneous TID WC   insulin aspart  0-5 Units Subcutaneous QHS   insulin aspart  5 Units Subcutaneous TID WC   insulin  detemir  20 Units Subcutaneous QHS   lanthanum  500 mg Oral TID WC   Living Better with Heart Failure Book   Does not apply Once   mupirocin ointment   Nasal BID   polyethylene glycol  17 g Oral Daily   predniSONE  40 mg Oral Q breakfast   rosuvastatin  5 mg Oral Daily   [facility]  TTS , 3:45,  EDW 80 kg, 1 K /2.5 calc,  heparin - yes, calcitriol 1.75 TIW, mircera 100 q 2 weeks, venofer 50 weekly-  using TDC 350 BFR/500 DFR   Was on vanc 1 gram and fortaz 1 gram after treatment thru 11/5 for possible infected new AVG  Assessment/ Plan:   COPD exacerbation - still with some wheezing but on room air Acute on chronic diastolic CHF - improved with HD and UF of 3.5 L yesterday ESRD continue with HD on TTS schedule Anemia: no ESA due to hgb >12 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 Donnetta Hutching who does not  feel that AVG is infected.  Ok to start using in 2 weeks Disposition - stable for discharge from renal standpoint.  If still here will plan for HD.  Donetta Potts, MD [place]  10/04/2022, 9:20 AM

## 2022-10-05 ENCOUNTER — Telehealth: Payer: Self-pay

## 2022-10-05 DIAGNOSIS — E559 Vitamin D deficiency, unspecified: Secondary | ICD-10-CM | POA: Diagnosis not present

## 2022-10-05 DIAGNOSIS — N186 End stage renal disease: Secondary | ICD-10-CM | POA: Diagnosis not present

## 2022-10-05 DIAGNOSIS — Z992 Dependence on renal dialysis: Secondary | ICD-10-CM | POA: Diagnosis not present

## 2022-10-05 DIAGNOSIS — D509 Iron deficiency anemia, unspecified: Secondary | ICD-10-CM | POA: Diagnosis not present

## 2022-10-05 DIAGNOSIS — N250 Renal osteodystrophy: Secondary | ICD-10-CM | POA: Diagnosis not present

## 2022-10-05 DIAGNOSIS — D631 Anemia in chronic kidney disease: Secondary | ICD-10-CM | POA: Diagnosis not present

## 2022-10-05 DIAGNOSIS — N2581 Secondary hyperparathyroidism of renal origin: Secondary | ICD-10-CM | POA: Diagnosis not present

## 2022-10-05 DIAGNOSIS — T827XXA Infection and inflammatory reaction due to other cardiac and vascular devices, implants and grafts, initial encounter: Secondary | ICD-10-CM | POA: Diagnosis not present

## 2022-10-05 NOTE — Telephone Encounter (Signed)
Mailed Novo Nordisk renewal application to patient home.   Charlesia Canaday L. CPhT [facility] Patient Advocate  

## 2022-10-05 NOTE — Telephone Encounter (Signed)
Transition Care Management Unsuccessful Follow-up Telephone Call  Date of discharge and from where:  10/04/2022  [name]   Attempts:  1st Attempt  Reason for unsuccessful TCM follow-up call:  No answer/busy

## 2022-10-06 ENCOUNTER — Telehealth: Payer: Self-pay | Admitting: *Deleted

## 2022-10-07 DIAGNOSIS — T827XXA Infection and inflammatory reaction due to other cardiac and vascular devices, implants and grafts, initial encounter: Secondary | ICD-10-CM | POA: Diagnosis not present

## 2022-10-07 DIAGNOSIS — N186 End stage renal disease: Secondary | ICD-10-CM | POA: Diagnosis not present

## 2022-10-07 DIAGNOSIS — N2581 Secondary hyperparathyroidism of renal origin: Secondary | ICD-10-CM | POA: Diagnosis not present

## 2022-10-07 DIAGNOSIS — N250 Renal osteodystrophy: Secondary | ICD-10-CM | POA: Diagnosis not present

## 2022-10-07 DIAGNOSIS — Z992 Dependence on renal dialysis: Secondary | ICD-10-CM | POA: Diagnosis not present

## 2022-10-07 DIAGNOSIS — D509 Iron deficiency anemia, unspecified: Secondary | ICD-10-CM | POA: Diagnosis not present

## 2022-10-08 NOTE — Progress Notes (Deleted)
Cardiology Office Note   Date:  10/08/2022   ID:  KELLSIE GRINDLE, DOB August 20, 1951, MRN [id number]  PCP:  [name], MD  Cardiologist:   Minus Breeding, MD   No chief complaint on file.       History of Present Illness: [name] is a 71 y.o. female who presents for follow up of CAD, diastolic heart failure with diastolic dysfunction and atrial fib.   She was in the hospital with a NSTEMI.  She had PCI as below.  She had atrial fib and was treated with DOAC.   Since I saw her in the hospital with her non-STEMI she has been back again with atrial fibrillation rapid rate and I reviewed these records for this visit.  She had some excess volume and required an additional dialysis.  She had Cardizem to control her rate.  This was IV.  Of note she has had some bleeding issues with her dialysis access and had to hold her Eliquis for several days.  She had 2 hospitalizations in April.  She was in the hospital again in May with respiratory failure.  I reviewed these records for this visit.  She had multifocular pneumonia.  She PAF but ws in NSR at the end of the hospitalization.  At discharge Torsemide was discontinued.  She went back into atrial fibrillation.  She was hospitalized on 6/27 through 7/2 symptomatic anemia secondary to gastrointestinal bleeding.  She received 1 unit of PRBCs and underwent EGD.  She was found to have 2 bleeding lesions in her duodenum which were treated with APC and clips on 6/30.   She was last admitted on 06/10/2022 and discharged on 06/15/2022.  Again, she had acute blood loss anemia.  She had a negative capsule endoscopy.  Brilinta was stopped.    Since I was last seen she was in the hospital in October with SOB and volume overload.   She has been seen in the Atrial Fib Clinic.  He is now on amiodarone.  ***    71 year old female with a history of end-stage renal disease on hemodialysis, diastolic heart failure, atrial fibrillation, admitted to the hospital  with shortness of breath.  Found to be in volume overload.  Nephrology consulted for dialysis needs and underwent hemodialysis during the stay.  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  She was also noted to have some COPD exacerbation and was being treated with bronchodilators as well as prednisone and continues to have some minimal wheezing, but is otherwise stable for discharge and on room air.  She has had hemodialysis 10/31 with no acute events or concerns and is in stable condition for discharge today.   ***   *** He has stopped her Seroquel in anticipation of starting her on amiodarone.  She does not yet have an appointment scheduled in the atrial fibrillation clinic.  She is feeling better since she was out of the hospital and she thinks in large part because she got oxygen at night because she had some nighttime oxygen desaturations.  Her biggest complaint is leg pain right now.  She has had ABIs that demonstrated moderate disease but it was not felt that she needed intervention.  In fact she saw Dr. Donnetta Hutching today.  He is planning probably to work on her dialysis fistula but does not suggest any revascularization of her legs.  She says she has a burning discomfort from the mid thighs down.  She has some leg weakness.  She is not having any chest pressure, neck or arm discomfort.  She has had some low blood pressures with dialysis recently.  She has got black stools since being on iron but she is not having any red stools like she was having.  She is not having to take any nitroglycerin.  She is not having any presyncope or syncope.   Past Medical History:  Diagnosis Date   Acute on chronic diastolic CHF (congestive heart failure) ([place]) 03/05/2015   Grade 2. EF 60-65%   Anemia    hx of   Anxiety    Arthritis    CAD (coronary artery disease)    s/p Stenting in 2005;  [place]  7/09: Vigorous LV function, 2-3+ MR, RI 40%, RI stent patent, proximal-mid RCA 40-50%;  Echo 7/09:  EF 55-65%, trivial MR;  Myov[place] 11/18/12: EF 66%, normal LV wall motion, mild to moderate anterior ischemia - reviewed by [facility] and felt to rep breast atten (low risk);  Echo 6/14: mild LVH, EF 55-65%, Gr 2 DD, PASP 44, trivial eff    Chronic kidney disease    CREATININE IS UP--GOING TO KIDNEY MD    Chronic neck pain    COPD (chronic obstructive pulmonary disease) ([facility])    Degenerative joint disease    left shoulder   Diabetic neuropathy ([facility])    Diverticulosis    Esophageal stricture    GERD (gastroesophageal reflux disease)    Hyperlipidemia    Hypertension    IDDM (insulin dependent diabetes mellitus)    Pericardial effusion 03/06/2015   Very small per echo ; pleural nodules seen on CT chest.   Peripheral vascular disease ([place])    Stroke ([place])    "mini- years ago"   Vitamin D deficiency     Past Surgical History:  Procedure Laterality Date   A/V FISTULAGRAM N/A 08/10/2017   Procedure: A/V Fistulagram - Right Arm;  Surgeon: Elam Dutch, MD;  Location: [place] [facility];  Service: Cardiovascular;  Laterality: N/A;   A/V FISTULAGRAM N/A 03/03/2020   Procedure: A/V FISTULAGRAM - Right Arm;  Surgeon: Marty Heck, MD;  Location: [place] [facility];  Service: Cardiovascular;  Laterality: N/A;   ABDOMINAL HYSTERECTOMY     AV FISTULA PLACEMENT Right 07/20/2015   Procedure: RADIAL CEPHALIC ARTERIOVENOUS FISTULA CREATION RIGHT ARM;  Surgeon: Angelia Mould, MD;  Location: [place];  Service: Vascular;  Laterality: Right;   AV FISTULA PLACEMENT Right 11/26/2015   Procedure:  Creation of RIGHT BRACHIO-CEPHALIC arteriovenous fistula;  Surgeon: Angelia Mould, MD;  Location: [place];  Service: Vascular;  Laterality: Right;   AV FISTULA PLACEMENT Right 08/15/2022   Procedure: INSERTION OF RIGHT UPPER ARM ARTERIOVENOUS (AV) GORE-TEX GRAFT;  Surgeon: Rosetta Posner, MD;  [facility];  Service: Vascular;  Laterality: Right;   BACK SURGERY     2007 & 2013   [place]  2003, 2005, 2009   1 stent   cardiac stents     CARDIOVERSION N/A 04/24/2022   Procedure: CARDIOVERSION;  Surgeon: Fay Records, MD;  Location[facility] ENDOSCOPY;  Service: Cardiovascular;  Laterality: N/A;   CATARACT EXTRACTION W/PHACO Right 05/04/2014   Procedure: CATARACT EXTRACTION PHACO AND INTRAOCULAR LENS PLACEMENT ([place]);  Surgeon: Williams Che, MD;  Location: [facility];  Service: Ophthalmology;  Laterality: Right;  CDE 1.47   CATARACT EXTRACTION W/PHACO Left 07/20/2014   Procedure: CATARACT EXTRACTION WITH PHACO AND INTRAOCULAR LENS PLACEMENT LEFT EYE CDE=1.79;  Surgeon: Williams Che, MD;  Location: [facility];  Service: Ophthalmology;  Laterality: Left;   CERVICAL SPINE SURGERY  2012   8 screws   CORONARY ATHERECTOMY N/A 03/08/2022   Procedure: CORONARY ATHERECTOMY;  Surgeon: Sherren Mocha, MD;  Location: [place] [facility];  Service: Cardiovascular;  Laterality: N/A;   CYSTOSCOPY WITH INJECTION N/A 06/03/2013   Procedure: MACROPLASTIQUE  INJECTION ;  Surgeon: Malka So, MD;  Location: [facility];  Service: Urology;  Laterality: N/A;   ESOPHAGEAL DILATION Right 06/02/2022   Procedure: ESOPHAGEAL DILATION;  Surgeon: Harvel Quale, MD;  [facility];  Service: Gastroenterology;  Laterality: Right;   ESOPHAGOGASTRODUODENOSCOPY (EGD) WITH PROPOFOL N/A 06/02/2022   Procedure: ESOPHAGOGASTRODUODENOSCOPY (EGD) WITH PROPOFOL;  Surgeon: Harvel Quale, MD;  [facility];  Service: Gastroenterology;  Laterality: N/A;  TO BE DONE IN OR 2   FISTULOGRAM Right 10/20/2016   Procedure: FISTULOGRAM WITH POSSIBLE INTERVENTION;  Surgeon: [name], MD;  Location: [facility];  Service: Vascular;  Laterality: Right;   GIVENS CAPSULE STUDY N/A 06/11/2022   Procedure: GIVENS CAPSULE STUDY;  Surgeon: Clarene Essex, MD;  Location: [place];   Service: Gastroenterology;  Laterality: N/A;   HEMOSTASIS CLIP PLACEMENT  06/02/2022   Procedure: HEMOSTASIS CLIP PLACEMENT;  Surgeon: Harvel Quale, MD;  [facility];  Service: Gastroenterology;;   HOT HEMOSTASIS  06/02/2022   Procedure: HOT HEMOSTASIS (ARGON PLASMA COAGULATION/BICAP);  Surgeon: Montez Morita, Quillian Quince, MD;  [facility];  Service: Gastroenterology;;   INSERTION OF DIALYSIS CATHETER N/A 11/26/2015   Procedure: INSERTION OF DIALYSIS CATHETER;  Surgeon: Angelia Mould, MD;  Location: [place];  Service: Vascular;  Laterality: N/A;   INSERTION OF DIALYSIS CATHETER Right 08/15/2022   Procedure: INSERTION OF TUNNELED DIALYSIS CATHETER;  Surgeon: Rosetta Posner, MD;  [facility];  Service: Vascular;  Laterality: Right;   INTRAVASCULAR IMAGING/OCT N/A 03/08/2022   Procedure: INTRAVASCULAR IMAGING/OCT;  Surgeon: Sherren Mocha, MD;  Location: [place] [facility];  Service: Cardiovascular;  Laterality: N/A;   LAMINECTOMY  12/29/2011   LEFT HEART CATH AND CORONARY ANGIOGRAPHY N/A 03/06/2022   Procedure: LEFT HEART CATH AND CORONARY ANGIOGRAPHY;  Surgeon: Nelva Bush, MD;  Location: [place] [facility];  Service: Cardiovascular;  Laterality: N/A;   LUMBAR LAMINECTOMY/DECOMPRESSION MICRODISCECTOMY  12/29/2011   Procedure: LUMBAR LAMINECTOMY/DECOMPRESSION MICRODISCECTOMY;  Surgeon: Floyce Stakes, MD;  Location: [place] NEUR[facility] ORS;  Service: Neurosurgery;  Laterality: N/A;  Lumbar Two through Lumbar Five Laminectomies/Cellsaver   PARTIAL HYSTERECTOMY     PERIPHERAL VASCULAR BALLOON ANGIOPLASTY  03/03/2020   Procedure: PERIPHERAL VASCULAR BALLOON ANGIOPLASTY;  Surgeon: Marty Heck, MD;  Location: [place] [facility];  Service: Cardiovascular;;  Rt arm fistula   PERIPHERAL VASCULAR BALLOON ANGIOPLASTY Right 04/15/2021   Procedure: PERIPHERAL VASCULAR BALLOON ANGIOPLASTY;  Surgeon: Angelia Mould, MD;  Location: [place] [facility];  Service:  Cardiovascular;  Laterality: Right;  arm fistula   PERIPHERAL VASCULAR BALLOON ANGIOPLASTY  03/14/2022   Procedure: PERIPHERAL VASCULAR BALLOON ANGIOPLASTY;  Surgeon: Broadus John, MD;  Location: [place] [facility];  Service: Cardiovascular;;  right arm AVF   PERIPHERAL VASCULAR CATHETERIZATION N/A 11/22/2015   Procedure: Fistulagram;  Surgeon: Angelia Mould, MD;  Location: [place] [facility];  Service: Cardiovascular;  Laterality: N/A;   PERIPHERAL VASCULAR  INTERVENTION  08/10/2017   Procedure: PERIPHERAL VASCULAR INTERVENTION;  Surgeon: Elam Dutch, MD;  Location: [place] [facility];  Service: Cardiovascular;;   SHOULDER SURGERY Right    Rotator cuff     Current Outpatient Medications  Medication Sig Dispense Refill   acetaminophen (TYLENOL) 500 MG tablet Take 1,000 mg by mouth every 6 (six) hours as needed for moderate pain or headache.     albuterol (PROVENTIL) (2.5 MG/3ML) 0.083% nebulizer solution Take 3 mLs (2.5 mg total) by nebulization every 6 (six) hours as needed for wheezing or shortness of breath. 75 mL 12   amiodarone (PACERONE) 200 MG tablet Take 1 tablet by mouth twice a day for 2 weeks then reduce to 1 tablet daily (Patient taking differently: Take 200 mg by mouth 2 (two) times daily.) 60 tablet 0   apixaban (ELIQUIS) 5 MG TABS tablet Take 1 tablet (5 mg total) by mouth 2 (two) times daily. 180 tablet 3   aspirin EC 81 MG tablet Take 1 tablet (81 mg total) by mouth daily. Swallow whole. 30 tablet 12   atenolol (TENORMIN) 100 MG tablet Take 0.5 tablets (50 mg total) by mouth at bedtime. 90 tablet 3   budesonide-formoterol (SYMBICORT) 160-4.5 MCG/ACT inhaler Inhale 2 puffs into the lungs 2 (two) times daily. (Patient taking differently: Inhale 2 puffs into the lungs 2 (two) times daily as needed (shortness of breath).) 3 each 4   Carboxymethylcellul-Glycerin (LUBRICATING EYE DROPS OP) Place 1 drop into both eyes daily as needed (dry eyes).     cetirizine (ZYRTEC)  10 MG tablet Take 1 tablet (10 mg total) by mouth daily as needed for allergies. (Patient taking differently: Take 10 mg by mouth daily.) 90 tablet 1   Continuous Blood Gluc Receiver (FREESTYLE LIBRE 2 READER) DEVI USE TO TEST BLOOD SUGAR CONTINUOUSLY 1 each 2   Continuous Blood Gluc Sensor (FREESTYLE LIBRE 2 SENSOR) MISC Use multiple times daily to track glucose to prevent highs and lows as a complication of dialysis Dx:Z99.2, E11.59 2 each 12   diltiazem (CARDIZEM CD) 240 MG 24 hr capsule Take 1 capsule (240 mg total) by mouth daily. 90 capsule 3   DULoxetine (CYMBALTA) 30 MG capsule TAKE 1 CAPSULE BY MOUTH ONCE DAILY WITH SUPPER (Patient taking differently: Take 30 mg by mouth every evening.) 30 capsule 0   Ferrous Fumarate (HEMOCYTE - 106 MG FE) 324 (106 Fe) MG TABS tablet Take 1 tablet (106 mg of iron total) by mouth daily. 30 tablet 11   gabapentin (NEURONTIN) 400 MG capsule TAKE 2 CAPSULES AFTER EACH DIALYSIS SESSION (Patient taking differently: Take 800 mg by mouth See admin instructions. Take 800 mg by mouth Tuesday, Thursday and Saturday after dialysis) 30 capsule 1   glucose blood (ONETOUCH ULTRA) test strip Use to test blood sugar 3 times daily as directed. DX: E11.9 300 each 12   insulin aspart (NOVOLOG FLEXPEN) 100 UNIT/ML FlexPen Inject 1 Units into the skin 3 (three) times daily with meals. < 150: 0 units. 150-199:1 unit. 200-249: 2 units. 250-299: 3 units. 300-349: 4 units. Over 350: 5 units 15 mL 11   insulin degludec (TRESIBA FLEXTOUCH) 100 UNIT/ML FlexTouch Pen Inject 20 Units into the skin at bedtime. Via novo nordisk patient assistance program (Patient taking differently: Inject 25 Units into the skin at bedtime. Via novo nordisk patient assistance program)     lanthanum (FOSRENOL) 750 MG chewable tablet SMARTSIG:2 Tablet(s) By Mouth (Patient not taking: Reported on 09/29/2022)  multivitamin (RENA-VIT) TABS tablet Take 1 tablet by mouth daily.     nitroGLYCERIN (NITROSTAT) 0.4 MG  SL tablet Place 1 tablet (0.4 mg total) under the tongue every 5 (five) minutes as needed for chest pain. 25 tablet 11   ondansetron (ZOFRAN) 4 MG tablet TAKE 1 TABLET BY MOUTH EVERY 8 HOURS AS NEEDED FOR NAUSEA AND VOMITING (Patient taking differently: Take 4 mg by mouth every 8 (eight) hours as needed for nausea or vomiting. TAKE 1 TABLET BY MOUTH EVERY 8 HOURS AS NEEDED FOR NAUSEA AND VOMITING) 20 tablet 5   polyethylene glycol (MIRALAX / GLYCOLAX) 17 g packet Take 17 g by mouth daily as needed for moderate constipation or mild constipation. 14 each 0   predniSONE (DELTASONE) 20 MG tablet Take 2 tablets (40 mg total) by mouth daily with breakfast for 5 days. 10 tablet 0   rOPINIRole (REQUIP) 1 MG tablet Take 1 mg by mouth at bedtime.     rosuvastatin (CRESTOR) 5 MG tablet Take 1 tablet (5 mg total) by mouth daily. For cholesterol (Patient not taking: Reported on 09/29/2022) 90 tablet 2   Semaglutide,0.25 or 0.5MG /DOS, (OZEMPIC, 0.25 OR 0.5 MG/DOSE,) 2 MG/1.5ML SOPN Inject 0.5 mg into the skin every 7 (seven) days. Saturday     torsemide (DEMADEX) 20 MG tablet Take 20 mg by mouth daily.     No current facility-administered medications for this visit.    Allergies:   Azithromycin, Ace inhibitors, Clopidogrel bisulfate, Codeine, Lisinopril, and Penicillins   ROS:  Please see the history of present illness.   Otherwise, review of systems are positive for ***.   All other systems are reviewed and negative.    PHYSICAL EXAM: VS:  There were no vitals taken for this visit. , BMI There is no height or weight on file to calculate BMI. GENERAL:  Well appearing NECK:  No jugular venous distention, waveform within normal limits, carotid upstroke brisk and symmetric, no bruits, no thyromegaly LUNGS:  Clear to auscultation bilaterally CHEST:  Unremarkable HEART:  PMI not displaced or sustained,S1 and S2 within normal limits, no S3, no S4, no clicks, no rubs, *** murmurs ABD:  Flat, positive bowel sounds  normal in frequency in pitch, no bruits, no rebound, no guarding, no midline pulsatile mass, no hepatomegaly, no splenomegaly EXT:  2 plus pulses throughout, no edema, no cyanosis no clubbing    ***GENERAL:  Well appearing NECK:  No jugular venous distention, waveform within normal limits, carotid upstroke brisk and symmetric, no bruits, no thyromegaly LUNGS:  Clear to auscultation bilaterally CHEST:  Unremarkable HEART:  PMI not displaced or sustained,S1 and S2 within normal limits, no S3, no clicks, no rubs, no murmurs, irregular ABD:  Flat, positive bowel sounds normal in frequency in pitch, no bruits, no rebound, no guarding, no midline pulsatile mass, no hepatomegaly, no splenomegaly EXT:  2 plus pulses upper and decreased dorsalis pedis and posterior tibialis bilateral, no edema, no cyanosis no clubbing.  Right upper arm dialysis fistula pulsatile but without thrill or bruit   EKG:  EKG is *** ordered today. ***   Recent Labs: 08/02/2022: TSH 4.650 09/29/2022: B Natriuretic Peptide >4,500.0 09/30/2022: ALT 26; Magnesium 2.3 10/04/2022: BUN 49; Creatinine, Ser 7.04; Hemoglobin 12.9; Platelets 323; Potassium 4.6; Sodium 133    Lipid Panel    Component Value Date/Time   CHOL 127 06/28/2022 1127   CHOL 241 (H) 03/25/2013 1026   TRIG 100 06/28/2022 1127   TRIG 147 10/09/2013 1527   TRIG  209 (H) 03/25/2013 1026   HDL 42 06/28/2022 1127   HDL 65 10/09/2013 1527   HDL 51 03/25/2013 1026   CHOLHDL 3.0 06/28/2022 1127   CHOLHDL 2.9 03/06/2022 0616   VLDL 19 03/06/2022 0616   LDLCALC 66 06/28/2022 1127   LDLCALC 125 (H) 10/09/2013 1527   LDLCALC 148 (H) 03/25/2013 1026      Wt Readings from Last 3 Encounters:  10/04/22 175 lb 1.6 oz (79.4 kg)  09/27/22 176 lb 1.6 oz (79.9 kg)  09/18/22 179 lb 9.6 oz (81.5 kg)    Cardiac cath   Diagnostic Dominance: Right Intervention    Other studies Reviewed: Additional studies/ records that were reviewed today include:    *** Review of the above records demonstrates:  Please see elsewhere in the note.     ASSESSMENT AND PLAN:    NSTEMI:   ***  It sounds like she is having no unstable angina.  She will continue with risk reduction and medical management.  She will continue on the aspirin.     Atrial fib:  ***  I will get her into the atrial fibrillation clinic to begin amiodarone as she is now off the Seroquel.  She might be eventually considered for ablation.  She remains on her Eliquis.   CAD:  ***  The patient has no new sypmtoms.  No further cardiovascular testing is indicated.  We will continue with aggressive risk reduction and meds as listed.  Chronic diastolic HF:    *** Volume is managed in dialysis.  Pericardial effusion:  ***  This was small previously and will follow clinically.  Essential hypertension:   *** She has actually had low blood pressures.  I would like to keep her on the dose of atenolol she is on because of tachyarrhythmia previously but if we can control her rhythm or rate with amiodarone we might be able to back off on that.   Dyslipidemia: LDL was 64 *** with an HDL of 44.  No change in therapy.   Leg pain:   ***  Given this I will have her do a 4-week trial off Crestor to see if she has any improvement.  Current medicines are reviewed at length with the patient today.  The patient does not have concerns regarding medicines.  The following changes have been made: ***  Labs/ tests ordered today include: ***  No orders of the defined types were placed in this encounter.    Disposition:   FU with me *** months in [place].   Signed, Minus Breeding, MD  10/08/2022 7:53 PM    [place]

## 2022-10-09 ENCOUNTER — Inpatient Hospital Stay (HOSPITAL_COMMUNITY)
Admission: EM | Admit: 2022-10-09 | Discharge: 2022-10-12 | DRG: 189 | Disposition: A | Discharge status: Skilled Nursing Facility | Payer: Medicare Other | Attending: Internal Medicine | Admitting: Internal Medicine

## 2022-10-09 ENCOUNTER — Emergency Department (HOSPITAL_COMMUNITY): Payer: Medicare Other

## 2022-10-09 ENCOUNTER — Encounter (HOSPITAL_COMMUNITY): Payer: Self-pay | Admitting: Family Medicine

## 2022-10-09 ENCOUNTER — Other Ambulatory Visit: Payer: Self-pay

## 2022-10-09 DIAGNOSIS — G8929 Other chronic pain: Secondary | ICD-10-CM | POA: Diagnosis present

## 2022-10-09 DIAGNOSIS — D631 Anemia in chronic kidney disease: Secondary | ICD-10-CM | POA: Diagnosis present

## 2022-10-09 DIAGNOSIS — R627 Adult failure to thrive: Secondary | ICD-10-CM | POA: Diagnosis present

## 2022-10-09 DIAGNOSIS — J441 Chronic obstructive pulmonary disease with (acute) exacerbation: Secondary | ICD-10-CM | POA: Diagnosis present

## 2022-10-09 DIAGNOSIS — Z6830 Body mass index (BMI) 30.0-30.9, adult: Secondary | ICD-10-CM

## 2022-10-09 DIAGNOSIS — I25118 Atherosclerotic heart disease of native coronary artery with other forms of angina pectoris: Secondary | ICD-10-CM | POA: Diagnosis present

## 2022-10-09 DIAGNOSIS — M6281 Muscle weakness (generalized): Secondary | ICD-10-CM | POA: Diagnosis not present

## 2022-10-09 DIAGNOSIS — Z992 Dependence on renal dialysis: Secondary | ICD-10-CM

## 2022-10-09 DIAGNOSIS — R2689 Other abnormalities of gait and mobility: Secondary | ICD-10-CM | POA: Diagnosis not present

## 2022-10-09 DIAGNOSIS — E669 Obesity, unspecified: Secondary | ICD-10-CM | POA: Diagnosis present

## 2022-10-09 DIAGNOSIS — I272 Pulmonary hypertension, unspecified: Secondary | ICD-10-CM | POA: Diagnosis not present

## 2022-10-09 DIAGNOSIS — Z833 Family history of diabetes mellitus: Secondary | ICD-10-CM

## 2022-10-09 DIAGNOSIS — I4821 Permanent atrial fibrillation: Secondary | ICD-10-CM | POA: Diagnosis not present

## 2022-10-09 DIAGNOSIS — R519 Headache, unspecified: Secondary | ICD-10-CM | POA: Diagnosis present

## 2022-10-09 DIAGNOSIS — Z7901 Long term (current) use of anticoagulants: Secondary | ICD-10-CM

## 2022-10-09 DIAGNOSIS — S0990XA Unspecified injury of head, initial encounter: Secondary | ICD-10-CM | POA: Diagnosis not present

## 2022-10-09 DIAGNOSIS — I2489 Other forms of acute ischemic heart disease: Secondary | ICD-10-CM | POA: Diagnosis present

## 2022-10-09 DIAGNOSIS — T380X5A Adverse effect of glucocorticoids and synthetic analogues, initial encounter: Secondary | ICD-10-CM | POA: Diagnosis present

## 2022-10-09 DIAGNOSIS — Z8673 Personal history of transient ischemic attack (TIA), and cerebral infarction without residual deficits: Secondary | ICD-10-CM

## 2022-10-09 DIAGNOSIS — E1140 Type 2 diabetes mellitus with diabetic neuropathy, unspecified: Secondary | ICD-10-CM | POA: Diagnosis present

## 2022-10-09 DIAGNOSIS — W08XXXA Fall from other furniture, initial encounter: Secondary | ICD-10-CM | POA: Diagnosis present

## 2022-10-09 DIAGNOSIS — N2581 Secondary hyperparathyroidism of renal origin: Secondary | ICD-10-CM | POA: Diagnosis present

## 2022-10-09 DIAGNOSIS — J9601 Acute respiratory failure with hypoxia: Secondary | ICD-10-CM | POA: Diagnosis not present

## 2022-10-09 DIAGNOSIS — E8770 Fluid overload, unspecified: Principal | ICD-10-CM

## 2022-10-09 DIAGNOSIS — D72829 Elevated white blood cell count, unspecified: Secondary | ICD-10-CM | POA: Diagnosis present

## 2022-10-09 DIAGNOSIS — J9621 Acute and chronic respiratory failure with hypoxia: Secondary | ICD-10-CM | POA: Diagnosis not present

## 2022-10-09 DIAGNOSIS — R319 Hematuria, unspecified: Secondary | ICD-10-CM | POA: Diagnosis present

## 2022-10-09 DIAGNOSIS — Z794 Long term (current) use of insulin: Secondary | ICD-10-CM | POA: Diagnosis not present

## 2022-10-09 DIAGNOSIS — G4489 Other headache syndrome: Secondary | ICD-10-CM | POA: Diagnosis not present

## 2022-10-09 DIAGNOSIS — I5082 Biventricular heart failure: Secondary | ICD-10-CM | POA: Diagnosis present

## 2022-10-09 DIAGNOSIS — R188 Other ascites: Secondary | ICD-10-CM | POA: Diagnosis not present

## 2022-10-09 DIAGNOSIS — Z87891 Personal history of nicotine dependence: Secondary | ICD-10-CM

## 2022-10-09 DIAGNOSIS — Z79899 Other long term (current) drug therapy: Secondary | ICD-10-CM

## 2022-10-09 DIAGNOSIS — R41841 Cognitive communication deficit: Secondary | ICD-10-CM | POA: Diagnosis not present

## 2022-10-09 DIAGNOSIS — E782 Mixed hyperlipidemia: Secondary | ICD-10-CM | POA: Diagnosis present

## 2022-10-09 DIAGNOSIS — W19XXXA Unspecified fall, initial encounter: Secondary | ICD-10-CM | POA: Diagnosis not present

## 2022-10-09 DIAGNOSIS — I6782 Cerebral ischemia: Secondary | ICD-10-CM | POA: Diagnosis not present

## 2022-10-09 DIAGNOSIS — E1165 Type 2 diabetes mellitus with hyperglycemia: Secondary | ICD-10-CM | POA: Diagnosis present

## 2022-10-09 DIAGNOSIS — I10 Essential (primary) hypertension: Secondary | ICD-10-CM | POA: Diagnosis present

## 2022-10-09 DIAGNOSIS — I132 Hypertensive heart and chronic kidney disease with heart failure and with stage 5 chronic kidney disease, or end stage renal disease: Secondary | ICD-10-CM | POA: Diagnosis present

## 2022-10-09 DIAGNOSIS — E1151 Type 2 diabetes mellitus with diabetic peripheral angiopathy without gangrene: Secondary | ICD-10-CM | POA: Diagnosis present

## 2022-10-09 DIAGNOSIS — K219 Gastro-esophageal reflux disease without esophagitis: Secondary | ICD-10-CM | POA: Diagnosis present

## 2022-10-09 DIAGNOSIS — R531 Weakness: Secondary | ICD-10-CM | POA: Diagnosis not present

## 2022-10-09 DIAGNOSIS — R0602 Shortness of breath: Secondary | ICD-10-CM | POA: Diagnosis not present

## 2022-10-09 DIAGNOSIS — I340 Nonrheumatic mitral (valve) insufficiency: Secondary | ICD-10-CM | POA: Diagnosis present

## 2022-10-09 DIAGNOSIS — F419 Anxiety disorder, unspecified: Secondary | ICD-10-CM | POA: Diagnosis present

## 2022-10-09 DIAGNOSIS — E1122 Type 2 diabetes mellitus with diabetic chronic kidney disease: Secondary | ICD-10-CM | POA: Diagnosis present

## 2022-10-09 DIAGNOSIS — R739 Hyperglycemia, unspecified: Secondary | ICD-10-CM | POA: Diagnosis not present

## 2022-10-09 DIAGNOSIS — Z7985 Long-term (current) use of injectable non-insulin antidiabetic drugs: Secondary | ICD-10-CM

## 2022-10-09 DIAGNOSIS — I5033 Acute on chronic diastolic (congestive) heart failure: Secondary | ICD-10-CM | POA: Diagnosis present

## 2022-10-09 DIAGNOSIS — Z8249 Family history of ischemic heart disease and other diseases of the circulatory system: Secondary | ICD-10-CM

## 2022-10-09 DIAGNOSIS — E559 Vitamin D deficiency, unspecified: Secondary | ICD-10-CM | POA: Diagnosis present

## 2022-10-09 DIAGNOSIS — I2510 Atherosclerotic heart disease of native coronary artery without angina pectoris: Secondary | ICD-10-CM | POA: Diagnosis not present

## 2022-10-09 DIAGNOSIS — E785 Hyperlipidemia, unspecified: Secondary | ICD-10-CM | POA: Diagnosis not present

## 2022-10-09 DIAGNOSIS — Z7952 Long term (current) use of systemic steroids: Secondary | ICD-10-CM

## 2022-10-09 DIAGNOSIS — N261 Atrophy of kidney (terminal): Secondary | ICD-10-CM | POA: Diagnosis not present

## 2022-10-09 DIAGNOSIS — K5790 Diverticulosis of intestine, part unspecified, without perforation or abscess without bleeding: Secondary | ICD-10-CM | POA: Diagnosis present

## 2022-10-09 DIAGNOSIS — Z955 Presence of coronary angioplasty implant and graft: Secondary | ICD-10-CM

## 2022-10-09 DIAGNOSIS — R2681 Unsteadiness on feet: Secondary | ICD-10-CM | POA: Diagnosis not present

## 2022-10-09 DIAGNOSIS — Z9981 Dependence on supplemental oxygen: Secondary | ICD-10-CM | POA: Diagnosis not present

## 2022-10-09 DIAGNOSIS — Y92009 Unspecified place in unspecified non-institutional (private) residence as the place of occurrence of the external cause: Secondary | ICD-10-CM | POA: Diagnosis not present

## 2022-10-09 DIAGNOSIS — J9611 Chronic respiratory failure with hypoxia: Secondary | ICD-10-CM | POA: Diagnosis not present

## 2022-10-09 DIAGNOSIS — N186 End stage renal disease: Secondary | ICD-10-CM | POA: Diagnosis present

## 2022-10-09 DIAGNOSIS — Z90711 Acquired absence of uterus with remaining cervical stump: Secondary | ICD-10-CM

## 2022-10-09 DIAGNOSIS — E1169 Type 2 diabetes mellitus with other specified complication: Secondary | ICD-10-CM

## 2022-10-09 DIAGNOSIS — N250 Renal osteodystrophy: Secondary | ICD-10-CM | POA: Diagnosis not present

## 2022-10-09 DIAGNOSIS — I700 Atherosclerosis of aorta: Secondary | ICD-10-CM | POA: Diagnosis not present

## 2022-10-09 DIAGNOSIS — M1990 Unspecified osteoarthritis, unspecified site: Secondary | ICD-10-CM | POA: Diagnosis present

## 2022-10-09 DIAGNOSIS — Z7982 Long term (current) use of aspirin: Secondary | ICD-10-CM

## 2022-10-09 LAB — CBC WITH DIFFERENTIAL/PLATELET
Abs Immature Granulocytes: 0.24 10*3/uL — ABNORMAL HIGH (ref 0.00–0.07)
Basophils Absolute: 0 10*3/uL (ref 0.0–0.1)
Basophils Relative: 0 %
Eosinophils Absolute: 0 10*3/uL (ref 0.0–0.5)
Eosinophils Relative: 0 %
HCT: 36.7 % (ref 36.0–46.0)
Hemoglobin: 11.9 g/dL — ABNORMAL LOW (ref 12.0–15.0)
Immature Granulocytes: 1 %
Lymphocytes Relative: 6 %
Lymphs Abs: 1.4 10*3/uL (ref 0.7–4.0)
MCH: 27.9 pg (ref 26.0–34.0)
MCHC: 32.4 g/dL (ref 30.0–36.0)
MCV: 86.2 fL (ref 80.0–100.0)
Monocytes Absolute: 1.6 10*3/uL — ABNORMAL HIGH (ref 0.1–1.0)
Monocytes Relative: 7 %
Neutro Abs: 20.5 10*3/uL — ABNORMAL HIGH (ref 1.7–7.7)
Neutrophils Relative %: 86 %
Platelets: 203 10*3/uL (ref 150–400)
RBC: 4.26 MIL/uL (ref 3.87–5.11)
RDW: 20.2 % — ABNORMAL HIGH (ref 11.5–15.5)
WBC: 23.7 10*3/uL — ABNORMAL HIGH (ref 4.0–10.5)
nRBC: 0.2 % (ref 0.0–0.2)

## 2022-10-09 LAB — COMPREHENSIVE METABOLIC PANEL
ALT: 115 U/L — ABNORMAL HIGH (ref 0–44)
AST: 39 U/L (ref 15–41)
Albumin: 3.7 g/dL (ref 3.5–5.0)
Alkaline Phosphatase: 139 U/L — ABNORMAL HIGH (ref 38–126)
Anion gap: 15 (ref 5–15)
BUN: 80 mg/dL — ABNORMAL HIGH (ref 8–23)
CO2: 25 mmol/L (ref 22–32)
Calcium: 10 mg/dL (ref 8.9–10.3)
Chloride: 97 mmol/L — ABNORMAL LOW (ref 98–111)
Creatinine, Ser: 9.32 mg/dL — ABNORMAL HIGH (ref 0.44–1.00)
GFR, Estimated: 4 mL/min — ABNORMAL LOW (ref >60)
Glucose, Bld: 271 mg/dL — ABNORMAL HIGH (ref 70–99)
Potassium: 4.6 mmol/L (ref 3.5–5.1)
Sodium: 137 mmol/L (ref 135–145)
Total Bilirubin: 1.2 mg/dL (ref 0.3–1.2)
Total Protein: 6.6 g/dL (ref 6.5–8.1)

## 2022-10-09 LAB — URINALYSIS, ROUTINE W REFLEX MICROSCOPIC
Bilirubin Urine: NEGATIVE
Glucose, UA: >=500 mg/dL — AB
Ketones, ur: NEGATIVE mg/dL
Leukocytes,Ua: NEGATIVE
Nitrite: NEGATIVE
Protein, ur: 100 mg/dL — AB
RBC / HPF: >50 RBC/hpf — ABNORMAL HIGH (ref 0–5)
Specific Gravity, Urine: 1.015 (ref 1.005–1.030)
pH: 6 (ref 5.0–8.0)

## 2022-10-09 LAB — TROPONIN I (HIGH SENSITIVITY)
Troponin I (High Sensitivity): 83 ng/L — ABNORMAL HIGH (ref <18)
Troponin I (High Sensitivity): 99 ng/L — ABNORMAL HIGH (ref <18)

## 2022-10-09 LAB — BLOOD GAS, VENOUS
Acid-base deficit: 1.5 mmol/L (ref 0.0–2.0)
Bicarbonate: 23.7 mmol/L (ref 20.0–28.0)
Drawn by: 66460
O2 Saturation: 88.1 %
Patient temperature: 36.5
pCO2, Ven: 40 mmHg — ABNORMAL LOW (ref 44–60)
pH, Ven: 7.38 (ref 7.25–7.43)
pO2, Ven: 57 mmHg — ABNORMAL HIGH (ref 32–45)

## 2022-10-09 LAB — CBG MONITORING, ED: Glucose-Capillary: 154 mg/dL — ABNORMAL HIGH (ref 70–99)

## 2022-10-09 LAB — BRAIN NATRIURETIC PEPTIDE: B Natriuretic Peptide: >4500 pg/mL — ABNORMAL HIGH (ref 0.0–100.0)

## 2022-10-09 MED ORDER — INSULIN ASPART 100 UNIT/ML IJ SOLN
0.0000 [IU] | Freq: Three times a day (TID) | INTRAMUSCULAR | Status: DC
  Administered 2022-10-10: 8 [IU] via SUBCUTANEOUS
  Administered 2022-10-10: 3 [IU] via SUBCUTANEOUS
  Administered 2022-10-11: 11 [IU] via SUBCUTANEOUS
  Administered 2022-10-11: 15 [IU] via SUBCUTANEOUS
  Administered 2022-10-11 – 2022-10-12 (×2): 8 [IU] via SUBCUTANEOUS

## 2022-10-09 MED ORDER — MOMETASONE FURO-FORMOTEROL FUM 200-5 MCG/ACT IN AERO
2.0000 | INHALATION_SPRAY | Freq: Two times a day (BID) | RESPIRATORY_TRACT | Status: DC
  Filled 2022-10-09: qty 8.8

## 2022-10-09 MED ORDER — NITROGLYCERIN 0.4 MG SL SUBL: 0.4000 mg | SUBLINGUAL_TABLET | SUBLINGUAL | Status: DC | PRN

## 2022-10-09 MED ORDER — FERROUS FUMARATE 324 (106 FE) MG PO TABS
1.0000 | ORAL_TABLET | Freq: Every day | ORAL | Status: DC
  Administered 2022-10-10: 106 mg via ORAL
  Filled 2022-10-09 (×4): qty 1

## 2022-10-09 MED ORDER — INSULIN ASPART 100 UNIT/ML IJ SOLN
0.0000 [IU] | Freq: Every day | INTRAMUSCULAR | Status: DC
  Administered 2022-10-10: 5 [IU] via SUBCUTANEOUS
  Administered 2022-10-11: 4 [IU] via SUBCUTANEOUS

## 2022-10-09 MED ORDER — ACETAMINOPHEN 650 MG RE SUPP: 650.0000 mg | Freq: Four times a day (QID) | RECTAL | Status: DC | PRN

## 2022-10-09 MED ORDER — SODIUM CHLORIDE 0.9% FLUSH
3.0000 mL | Freq: Two times a day (BID) | INTRAVENOUS | Status: DC
  Administered 2022-10-09 – 2022-10-11 (×5): 3 mL via INTRAVENOUS

## 2022-10-09 MED ORDER — RENA-VITE PO TABS
1.0000 | ORAL_TABLET | Freq: Every day | ORAL | Status: DC
  Administered 2022-10-10: 1 via ORAL
  Filled 2022-10-09 (×3): qty 1

## 2022-10-09 MED ORDER — DILTIAZEM HCL ER COATED BEADS 240 MG PO CP24
240.0000 mg | ORAL_CAPSULE | Freq: Every day | ORAL | Status: DC
  Administered 2022-10-09 – 2022-10-11 (×3): 240 mg via ORAL
  Filled 2022-10-09 (×4): qty 1

## 2022-10-09 MED ORDER — LORATADINE 10 MG PO TABS
10.0000 mg | ORAL_TABLET | Freq: Every day | ORAL | Status: DC
  Administered 2022-10-10 – 2022-10-12 (×3): 10 mg via ORAL
  Filled 2022-10-09 (×3): qty 1

## 2022-10-09 MED ORDER — SODIUM CHLORIDE 0.9 % IV SOLN: 250.0000 mL | INTRAVENOUS | Status: DC | PRN

## 2022-10-09 MED ORDER — SODIUM CHLORIDE 0.9% FLUSH: 3.0000 mL | INTRAVENOUS | Status: DC | PRN

## 2022-10-09 MED ORDER — IPRATROPIUM BROMIDE 0.02 % IN SOLN
0.5000 mg | Freq: Four times a day (QID) | RESPIRATORY_TRACT | Status: DC
  Administered 2022-10-09: 0.5 mg via RESPIRATORY_TRACT
  Filled 2022-10-09: qty 2.5

## 2022-10-09 MED ORDER — ALBUTEROL SULFATE (2.5 MG/3ML) 0.083% IN NEBU
2.5000 mg | INHALATION_SOLUTION | Freq: Four times a day (QID) | RESPIRATORY_TRACT | Status: DC
  Administered 2022-10-09: 2.5 mg via RESPIRATORY_TRACT
  Filled 2022-10-09: qty 3

## 2022-10-09 MED ORDER — METHYLPREDNISOLONE SODIUM SUCC 125 MG IJ SOLR
80.0000 mg | INTRAMUSCULAR | Status: DC
  Administered 2022-10-09: 80 mg via INTRAVENOUS
  Filled 2022-10-09: qty 2

## 2022-10-09 MED ORDER — ATENOLOL 25 MG PO TABS
50.0000 mg | ORAL_TABLET | Freq: Every day | ORAL | Status: DC
  Administered 2022-10-09 – 2022-10-11 (×3): 50 mg via ORAL
  Filled 2022-10-09 (×3): qty 2

## 2022-10-09 MED ORDER — AMIODARONE HCL 200 MG PO TABS
200.0000 mg | ORAL_TABLET | Freq: Two times a day (BID) | ORAL | Status: DC
  Administered 2022-10-09 – 2022-10-11 (×5): 200 mg via ORAL
  Filled 2022-10-09 (×6): qty 1

## 2022-10-09 MED ORDER — ACETAMINOPHEN 325 MG PO TABS
650.0000 mg | ORAL_TABLET | Freq: Four times a day (QID) | ORAL | Status: DC | PRN
  Administered 2022-10-09: 650 mg via ORAL
  Filled 2022-10-09: qty 2

## 2022-10-09 MED ORDER — POLYETHYLENE GLYCOL 3350 17 G PO PACK: 17.0000 g | PACK | Freq: Every day | ORAL | Status: DC | PRN

## 2022-10-09 MED ORDER — IPRATROPIUM-ALBUTEROL 0.5-2.5 (3) MG/3ML IN SOLN
3.0000 mL | Freq: Once | RESPIRATORY_TRACT | Status: AC
  Administered 2022-10-09: 3 mL via RESPIRATORY_TRACT
  Filled 2022-10-09: qty 3

## 2022-10-09 MED ORDER — ASPIRIN 81 MG PO TBEC
81.0000 mg | DELAYED_RELEASE_TABLET | Freq: Every day | ORAL | Status: DC
  Administered 2022-10-10 – 2022-10-12 (×3): 81 mg via ORAL
  Filled 2022-10-09 (×3): qty 1

## 2022-10-09 MED ORDER — INSULIN GLARGINE-YFGN 100 UNIT/ML ~~LOC~~ SOLN
25.0000 [IU] | Freq: Every day | SUBCUTANEOUS | Status: DC
  Administered 2022-10-09 – 2022-10-10 (×2): 25 [IU] via SUBCUTANEOUS
  Filled 2022-10-09 (×3): qty 0.25

## 2022-10-09 MED ORDER — APIXABAN 5 MG PO TABS
5.0000 mg | ORAL_TABLET | Freq: Two times a day (BID) | ORAL | Status: DC
  Administered 2022-10-09 – 2022-10-12 (×6): 5 mg via ORAL
  Filled 2022-10-09 (×6): qty 1

## 2022-10-09 MED ORDER — ROSUVASTATIN CALCIUM 10 MG PO TABS
5.0000 mg | ORAL_TABLET | Freq: Every day | ORAL | Status: DC
  Administered 2022-10-10: 5 mg via ORAL
  Filled 2022-10-09 (×3): qty 1

## 2022-10-09 MED ORDER — INSULIN DEGLUDEC 100 UNIT/ML ~~LOC~~ SOPN: 25.0000 [IU] | PEN_INJECTOR | Freq: Every day | SUBCUTANEOUS | Status: DC

## 2022-10-09 NOTE — ED Provider Notes (Signed)
[facility] EMERGENCY DEPARTMENT Provider Note  CSN: [id number] Arrival date & time: 10/09/22 1055  Chief Complaint(s) Weakness  HPI [name] is a 71 y.o. female with PMH ESRD on hemodialysis Tuesday Thursday Saturday, diastolic CHF, A-fib, recent hospitalization and discharged on 10/04/2022 who presents emergency department for evaluation of generalized fatigue and shortness of breath.  She states that she felt well after discharge but over the last 24 hours states that she feels extremely fatigued and feels that she cannot get off the couch due to her generalized weakness.  Endorses shortness of breath but denies chest pain, abdominal pain, nausea, vomiting or other systemic symptoms.  Does endorse streaks of blood with wiping but brown/black stool.  She states that she is on iron and all of her stools are black.  Patient states that she attempted to get out off the couch and fell striking her head on the ground.  Patient is on Eliquis.   Past Medical History Past Medical History:  Diagnosis Date   Acute on chronic diastolic CHF (congestive heart failure) ([place]) 03/05/2015   Grade 2. EF 60-65%   Anemia    hx of   Anxiety    Arthritis    CAD (coronary artery disease)    s/p Stenting in 2005;  [place] 7/09: Vigorous LV function, 2-3+ MR, RI 40%, RI stent patent, proximal-mid RCA 40-50%;  [place] 7/09:  EF 55-65%, trivial MR;  [place] 11/18/12: EF 66%, normal LV wall motion, mild to moderate anterior ischemia - reviewed by [facility] and felt to rep breast atten (low risk);  Echo 6/14: mild LVH, EF 55-65%, Gr 2 DD, PASP 44, trivial eff    Chronic kidney disease    CREATININE IS UP--GOING TO KIDNEY MD    Chronic neck pain    COPD (chronic obstructive pulmonary disease) (HCC)    Degenerative joint disease    left shoulder   Diabetic neuropathy (HCC)    Diverticulosis    Esophageal stricture    GERD (gastroesophageal reflux disease)    Hyperlipidemia    Hypertension    IDDM (insulin dependent  diabetes mellitus)    Pericardial effusion 03/06/2015   Very small per echo ; pleural nodules seen on CT chest.   Peripheral vascular disease ([place])    Stroke ([place])    "mini- years ago"   Vitamin D deficiency    Patient Active Problem List   Diagnosis Date Noted   Volume overload 09/30/2022   CHF exacerbation ([place]) 09/29/2022   Secondary hypercoagulable state ([place]) 09/06/2022   Non-ST elevation (NSTEMI) myocardial infarction ([place]) 07/10/2022   Chronic atrial fibrillation (HCC) 07/10/2022   Anxiety    Acute blood loss anemia 06/10/2022   Permanent atrial fibrillation ([place]) 06/10/2022   CAD S/P percutaneous coronary angioplasty 06/10/2022   Heart failure with preserved ejection fraction ([place]) 06/10/2022   Symptomatic anemia 05/30/2022   GI bleed 05/30/2022   Acute respiratory failure with hypoxia ([place]) 04/26/2022   Sepsis ([place]) 04/26/2022   Paroxysmal atrial fibrillation ([place]) 04/26/2022   Multifocal pneumonia 04/25/2022   Dyslipidemia 04/04/2022   Acute exacerbation of Diastolic CHF (congestive heart failure) ([place]) 03/26/2022   Physical deconditioning 03/09/2022   Hyponatremia 03/09/2022   Pulmonary hypertension ([place]) 03/06/2022   Hypokalemia 03/05/2022   Change in bowel habit 12/16/2021   Colon cancer screening 12/16/2021   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   Family history of colonic polyps 12/16/2021   Acute CHF (congestive heart failure) ([place]) 12/06/2021   Leukocytosis 12/06/2021  Elevated brain natriuretic peptide (BNP) level 12/06/2021   GERD (gastroesophageal reflux disease) 12/06/2021   Nausea 08/26/2021   Lumbago of lumbar region with sciatica 07/21/2021   Arthropathy of lumbar facet joint 06/27/2021   Radiculopathy, lumbar region 05/05/2021   Chest pain 02/19/2021   Essential hypertension    Mixed hyperlipidemia    Hypoalbuminemia    Bruit 01/05/2020   Educated about COVID-19 virus infection 01/05/2020   Lung nodule 05/01/2018   Prolonged Q-T interval  on ECG 09/06/2017   Unstable angina ([place]) 09/06/2017   GAD (generalized anxiety disorder) 03/04/2016   Leg pain 01/07/2016   Anemia of chronic disease/ESRD 12/10/2015   Dependence on renal dialysis ([place]) 12/10/2015   ESRD (end stage renal disease) on dialysis ([place]) 12/10/2015   Osteopathy in diseases classified elsewhere, unspecified site 12/10/2015   Constipation 10/25/2015   COPD (chronic obstructive pulmonary disease) ([place]) 03/06/2015   Pericardial effusion 03/06/2015   Chronic diastolic CHF (congestive heart failure) ([place]) 03/05/2015   PAD (peripheral artery disease) ([place]) 04/21/2014   Dyspnea 09/29/2013   Obesity (BMI 30.0-34.9) 12/05/2011   Diverticulosis 03/29/2011   Diabetic neuropathy ([place]) 03/29/2011   Esophageal stricture 03/29/2011   Vitamin D deficiency 03/29/2011   Degenerative joint disease of left shoulder 03/29/2011   Neck pain 03/29/2011   Type 2 diabetes mellitus with hyperlipidemia ([place]) 04/19/2009   Hyperlipidemia associated with type 2 diabetes mellitus ([place]) 04/19/2009   Coronary artery disease of native artery of native heart with stable angina pectoris ([place]) 04/19/2009   Home Medication(s) Prior to Admission medications   Medication Sig Start Date End Date Taking? Authorizing Provider  acetaminophen (TYLENOL) 500 MG tablet Take 1,000 mg by mouth every 6 (six) hours as needed for moderate pain or headache.    [provider]  albuterol (PROVENTIL) (2.5 MG/3ML) 0.083% nebulizer solution Take 3 mLs (2.5 mg total) by nebulization every 6 (six) hours as needed for wheezing or shortness of breath. 12/02/21   [name], MD  amiodarone (PACERONE) 200 MG tablet Take 1 tablet by mouth twice a day for 2 weeks then reduce to 1 tablet daily Patient taking differently: Take 200 mg by mouth 2 (two) times daily. 09/06/22   Fenton, Clint R, PA  apixaban (ELIQUIS) 5 MG TABS tablet Take 1 tablet (5 mg total) by mouth 2 (two) times daily. 04/05/22   Minus Breeding, MD   aspirin EC 81 MG tablet Take 1 tablet (81 mg total) by mouth daily. Swallow whole. 06/15/22   Vashti Hey, MD  atenolol (TENORMIN) 100 MG tablet Take 0.5 tablets (50 mg total) by mouth at bedtime. 08/02/22   Sherran Needs, NP  budesonide-formoterol [facility]) 160-4.5 MCG/ACT inhaler Inhale 2 puffs into the lungs 2 (two) times daily. Patient taking differently: Inhale 2 puffs into the lungs 2 (two) times daily as needed (shortness of breath). 05/03/22   [name], MD  Carboxymethylcellul-Glycerin (LUBRICATING EYE DROPS OP) Place 1 drop into both eyes daily as needed (dry eyes).    [provider]  cetirizine (ZYRTEC) 10 MG tablet Take 1 tablet (10 mg total) by mouth daily as needed for allergies. Patient taking differently: Take 10 mg by mouth daily. 06/15/22   Vashti Hey, MD  Continuous Blood Gluc Receiver (FREESTYLE LIBRE 2 READER) DEVI USE TO TEST BLOOD SUGAR CONTINUOUSLY 11/15/21   [name], MD  Continuous Blood Gluc Sensor (FREESTYLE LIBRE 2 SENSOR) MISC Use multiple times daily to track glucose to prevent highs and lows as a complication  of dialysis Dx:Z99.2, E11.59 09/15/21   [name], MD  diltiazem (CARDIZEM CD) 240 MG 24 hr capsule Take 1 capsule (240 mg total) by mouth daily. 05/17/22   Minus Breeding, MD  DULoxetine (CYMBALTA) 30 MG capsule TAKE 1 CAPSULE BY MOUTH ONCE DAILY WITH SUPPER Patient taking differently: Take 30 mg by mouth every evening. 09/04/22   [name], MD  Ferrous Fumarate (HEMOCYTE - 106 MG FE) 324 (106 Fe) MG TABS tablet Take 1 tablet (106 mg of iron total) by mouth daily. 06/28/22   [name], MD  gabapentin (NEURONTIN) 400 MG capsule TAKE 2 CAPSULES AFTER EACH DIALYSIS SESSION Patient taking differently: Take 800 mg by mouth See admin instructions. Take 800 mg by mouth Tuesday, Thursday and Saturday after dialysis 07/27/22   [name], MD  glucose blood (ONETOUCH ULTRA) test strip Use to test blood  sugar 3 times daily as directed. DX: E11.9 09/13/20   [name], MD  insulin aspart (NOVOLOG FLEXPEN) 100 UNIT/ML FlexPen Inject 1 Units into the skin 3 (three) times daily with meals. < 150: 0 units. 150-199:1 unit. 200-249: 2 units. 250-299: 3 units. 300-349: 4 units. Over 350: 5 units 05/19/22   Rakes, Connye Burkitt, FNP  insulin degludec (TRESIBA FLEXTOUCH) 100 UNIT/ML FlexTouch Pen Inject 20 Units into the skin at bedtime. Via novo nordisk patient assistance program Patient taking differently: Inject 25 Units into the skin at bedtime. Via novo nordisk patient assistance program 05/03/22   [name], MD  lanthanum (FOSRENOL) 750 MG chewable tablet SMARTSIG:2 Tablet(s) By Mouth Patient not taking: Reported on 09/29/2022 09/21/22   [provider]  multivitamin (RENA-VIT) TABS tablet Take 1 tablet by mouth daily. 05/13/22   [provider]  nitroGLYCERIN (NITROSTAT) 0.4 MG SL tablet Place 1 tablet (0.4 mg total) under the tongue every 5 (five) minutes as needed for chest pain. 11/09/21   Minus Breeding, MD  ondansetron (ZOFRAN) 4 MG tablet TAKE 1 TABLET BY MOUTH EVERY 8 HOURS AS NEEDED FOR NAUSEA AND VOMITING Patient taking differently: Take 4 mg by mouth every 8 (eight) hours as needed for nausea or vomiting. TAKE 1 TABLET BY MOUTH EVERY 8 HOURS AS NEEDED FOR NAUSEA AND VOMITING 07/03/22   [name], MD  polyethylene glycol (MIRALAX / GLYCOLAX) 17 g packet Take 17 g by mouth daily as needed for moderate constipation or mild constipation. 03/10/22   Pokhrel, Corrie Mckusick, MD  predniSONE (DELTASONE) 20 MG tablet Take 2 tablets (40 mg total) by mouth daily with breakfast for 5 days. 10/05/22 10/10/22  Manuella Ghazi, Pratik D, DO  rOPINIRole (REQUIP) 1 MG tablet Take 1 mg by mouth at bedtime.    [provider]  rosuvastatin (CRESTOR) 5 MG tablet Take 1 tablet (5 mg total) by mouth daily. For cholesterol Patient not taking: Reported on 09/29/2022 12/12/21   [name], MD   Semaglutide,0.25 or 0.5MG /DOS, (OZEMPIC, 0.25 OR 0.5 MG/DOSE,) 2 MG/1.5ML SOPN Inject 0.5 mg into the skin every 7 (seven) days. Saturday    [provider]  torsemide (DEMADEX) 20 MG tablet Take 20 mg by mouth daily. 07/02/22   [provider]  Past Surgical History Past Surgical History:  Procedure Laterality Date   A/V FISTULAGRAM N/A 08/10/2017   Procedure: A/V Fistulagram - Right Arm;  Surgeon: Elam Dutch, MD;  Location: [place] [facility];  Service: Cardiovascular;  Laterality: N/A;   A/V FISTULAGRAM N/A 03/03/2020   Procedure: A/V FISTULAGRAM - Right Arm;  Surgeon: Marty Heck, MD;  Location: [place] [facility];  Service: Cardiovascular;  Laterality: N/A;   ABDOMINAL HYSTERECTOMY     AV FISTULA PLACEMENT Right 07/20/2015   Procedure: RADIAL CEPHALIC ARTERIOVENOUS FISTULA CREATION RIGHT ARM;  Surgeon: Angelia Mould, MD;  Location: [place];  Service: Vascular;  Laterality: Right;   AV FISTULA PLACEMENT Right 11/26/2015   Procedure:  Creation of RIGHT BRACHIO-CEPHALIC arteriovenous fistula;  Surgeon: Angelia Mould, MD;  Location: [place];  Service: Vascular;  Laterality: Right;   AV FISTULA PLACEMENT Right 08/15/2022   Procedure: INSERTION OF RIGHT UPPER ARM ARTERIOVENOUS (AV) GORE-TEX GRAFT;  Surgeon: Rosetta Posner, MD;  Location: [facility];  Service: Vascular;  Laterality: Right;   BACK SURGERY     2007 & 2013   [place]  2003, 2005, 2009   1 stent   cardiac stents     CARDIOVERSION N/A 04/24/2022   Procedure: CARDIOVERSION;  Surgeon: Fay Records, MD;  Location: [facility] ENDOSCOPY;  Service: Cardiovascular;  Laterality: N/A;   CATARACT EXTRACTION W/PHACO Right 05/04/2014   Procedure: CATARACT EXTRACTION PHACO AND INTRAOCULAR LENS PLACEMENT ([place]);  Surgeon: Williams Che, MD;   Location: [facility];  Service: Ophthalmology;  Laterality: Right;  CDE 1.47   CATARACT EXTRACTION W/PHACO Left 07/20/2014   Procedure: CATARACT EXTRACTION WITH PHACO AND INTRAOCULAR LENS PLACEMENT LEFT EYE CDE=1.79;  Surgeon: Williams Che, MD;  Location: [facility];  Service: Ophthalmology;  Laterality: Left;   CERVICAL SPINE SURGERY  2012   8 screws   CORONARY ATHERECTOMY N/A 03/08/2022   Procedure: CORONARY ATHERECTOMY;  Surgeon: Sherren Mocha, MD;  Location: [place] [facility];  Service: Cardiovascular;  Laterality: N/A;   CYSTOSCOPY WITH INJECTION N/A 06/03/2013   Procedure: MACROPLASTIQUE  INJECTION ;  Surgeon: Malka So, MD;  Location: [facility];  Service: Urology;  Laterality: N/A;   ESOPHAGEAL DILATION Right 06/02/2022   Procedure: ESOPHAGEAL DILATION;  Surgeon: Harvel Quale, MD;  [facility];  Service: Gastroenterology;  Laterality: Right;   ESOPHAGOGASTRODUODENOSCOPY (EGD) WITH PROPOFOL N/A 06/02/2022   Procedure: ESOPHAGOGASTRODUODENOSCOPY (EGD) WITH PROPOFOL;  Surgeon: Harvel Quale, MD;  [facility];  Service: Gastroenterology;  Laterality: N/A;  TO BE DONE IN OR 2   FISTULOGRAM Right 10/20/2016   Procedure: FISTULOGRAM WITH POSSIBLE INTERVENTION;  Surgeon: [name], MD;  Location: [facility];  Service: Vascular;  Laterality: Right;   GIVENS CAPSULE STUDY N/A 06/11/2022   Procedure: GIVENS CAPSULE STUDY;  Surgeon: Clarene Essex, MD;  Location: [place];  Service: Gastroenterology;  Laterality: N/A;   HEMOSTASIS CLIP PLACEMENT  06/02/2022   Procedure: HEMOSTASIS CLIP PLACEMENT;  Surgeon: Harvel Quale, MD;  [facility];  Service: Gastroenterology;;   HOT HEMOSTASIS  06/02/2022   Procedure: HOT HEMOSTASIS (ARGON PLASMA COAGULATION/BICAP);  Surgeon: Montez Morita, Quillian Quince, MD;  [facility];  Service: Gastroenterology;;   INSERTION OF DIALYSIS CATHETER N/A 11/26/2015   Procedure: INSERTION OF DIALYSIS  CATHETER;  Surgeon: Angelia Mould, MD;  Location: [place];  Service: Vascular;  Laterality: N/A;  INSERTION OF DIALYSIS CATHETER Right 08/15/2022   Procedure: INSERTION OF TUNNELED DIALYSIS CATHETER;  Surgeon: Rosetta Posner, MD;  [facility];  Service: Vascular;  Laterality: Right;   INTRAVASCULAR IMAGING/OCT N/A 03/08/2022   Procedure: INTRAVASCULAR IMAGING/OCT;  Surgeon: Sherren Mocha, MD;  Location: [place] [facility];  Service: Cardiovascular;  Laterality: N/A;   LAMINECTOMY  12/29/2011   LEFT HEART CATH AND CORONARY ANGIOGRAPHY N/A 03/06/2022   Procedure: LEFT HEART CATH AND CORONARY ANGIOGRAPHY;  Surgeon: Nelva Bush, MD;  Location: [place] [facility];  Service: Cardiovascular;  Laterality: N/A;   LUMBAR LAMINECTOMY/DECOMPRESSION MICRODISCECTOMY  12/29/2011   Procedure: LUMBAR LAMINECTOMY/DECOMPRESSION MICRODISCECTOMY;  Surgeon: Floyce Stakes, MD;  Location: [place] [facility]UR[facility] ORS;  Service: Neurosurgery;  Laterality: N/A;  Lumbar Two through Lumbar Five Laminectomies/Cellsaver   PARTIAL HYSTERECTOMY     PERIPHERAL VASCULAR BALLOON ANGIOPLASTY  03/03/2020   Procedure: PERIPHERAL VASCULAR BALLOON ANGIOPLASTY;  Surgeon: Marty Heck, MD;  Location: [place] [facility];  Service: Cardiovascular;;  Rt arm fistula   PERIPHERAL VASCULAR BALLOON ANGIOPLASTY Right 04/15/2021   Procedure: PERIPHERAL VASCULAR BALLOON ANGIOPLASTY;  Surgeon: Angelia Mould, MD;  Location: [place] [facility];  Service: Cardiovascular;  Laterality: Right;  arm fistula   PERIPHERAL VASCULAR BALLOON ANGIOPLASTY  03/14/2022   Procedure: PERIPHERAL VASCULAR BALLOON ANGIOPLASTY;  Surgeon: Broadus John, MD;  Location: [place] [facility];  Service: Cardiovascular;;  right arm AVF   PERIPHERAL VASCULAR CATHETERIZATION N/A 11/22/2015   Procedure: Fistulagram;  Surgeon: Angelia Mould, MD;  Location: [place] [facility];  Service: Cardiovascular;  Laterality: N/A;   PERIPHERAL VASCULAR INTERVENTION   08/10/2017   Procedure: PERIPHERAL VASCULAR INTERVENTION;  Surgeon: Elam Dutch, MD;  Location: [place] [facility];  Service: Cardiovascular;;   SHOULDER SURGERY Right    Rotator cuff   Family History Family History  Problem Relation Age of Onset   Heart disease Other    Hypertension Father    Cancer Father    Hypertension Mother    Hypertension Daughter    Pancreatitis Brother    Hypertension Daughter    Diabetes Daughter     Social History Social History   Tobacco Use   Smoking status: Former    Packs/day: 1.00    Years: 30.00    Total pack years: 30.00    Types: Cigarettes    Quit date: 12/04/2001    Years since quitting: 20.8    Passive exposure: Never   Smokeless tobacco: Never  Vaping Use   Vaping Use: Never used  Substance Use Topics   Alcohol use: No    Alcohol/week: 6.0 standard drinks of alcohol    Types: 6 Cans of beer per week   Drug use: No   Allergies Azithromycin, Ace inhibitors, Clopidogrel bisulfate, Codeine, Lisinopril, and Penicillins  Review of Systems Review of Systems  Constitutional:  Positive for fatigue.  Respiratory:  Positive for shortness of breath.   Gastrointestinal:  Positive for blood in stool.    Physical Exam Vital Signs  I have reviewed the triage vital signs BP (!) 149/74 (BP Location: Left Arm)   Pulse 74   Temp 97.8 F (36.6 C) (Oral)   Resp 20   Ht 5\' 3"  (1.6 m)   Wt 79.4 kg   SpO2 93%   BMI 31.02 kg/m   Physical Exam Vitals and nursing note reviewed.  Constitutional:      General: She is not in acute distress.    Appearance: She  is well-developed. She is ill-appearing.  HENT:     Head: Normocephalic and atraumatic.  Eyes:     Conjunctiva/sclera: Conjunctivae normal.  Cardiovascular:     Rate and Rhythm: Normal rate and regular rhythm.     Heart sounds: No murmur heard. Pulmonary:     Effort: Pulmonary effort is normal. No respiratory distress.     Breath sounds: Rales present.  Abdominal:      Palpations: Abdomen is soft.     Tenderness: There is no abdominal tenderness.  Musculoskeletal:        General: No swelling.     Cervical back: Neck supple.  Skin:    General: Skin is warm and dry.     Capillary Refill: Capillary refill takes less than 2 seconds.  Neurological:     Mental Status: She is alert.  Psychiatric:        Mood and Affect: Mood normal.     ED Results and Treatments Labs (all labs ordered are listed, but only abnormal results are displayed) Labs Reviewed  CBC WITH DIFFERENTIAL/PLATELET - Abnormal; Notable for the following components:      Result Value   WBC 23.7 (*)    Hemoglobin 11.9 (*)    RDW 20.2 (*)    Neutro Abs 20.5 (*)    Monocytes Absolute 1.6 (*)    Abs Immature Granulocytes 0.24 (*)    All other components within normal limits  COMPREHENSIVE METABOLIC PANEL - Abnormal; Notable for the following components:   Chloride 97 (*)    Glucose, Bld 271 (*)    BUN 80 (*)    Creatinine, Ser 9.32 (*)    ALT 115 (*)    Alkaline Phosphatase 139 (*)    GFR, Estimated 4 (*)    All other components within normal limits  BRAIN NATRIURETIC PEPTIDE - Abnormal; Notable for the following components:   B Natriuretic Peptide >4,500.0 (*)    All other components within normal limits  BLOOD GAS, VENOUS - Abnormal; Notable for the following components:   pCO2, Ven 40 (*)    pO2, Ven 57 (*)    All other components within normal limits  TROPONIN I (HIGH SENSITIVITY) - Abnormal; Notable for the following components:   Troponin I (High Sensitivity) 83 (*)    All other components within normal limits  URINALYSIS, ROUTINE W REFLEX MICROSCOPIC  TROPONIN I (HIGH SENSITIVITY)                                                                                                                          Radiology DG Chest 2 View  Result Date: 10/09/2022 CLINICAL DATA:  Weakness, possible pneumonia. History of COPD and congestive heart failure. Dialysis patient. EXAM:  CHEST - 2 VIEW COMPARISON:  10/01/2022 FINDINGS: Right IJ dialysis catheter tip: Right atrium. Lower cervical plate and screw fixator. Atherosclerotic calcification of the aortic arch. Mild to moderate enlargement of the cardiopericardial silhouette with upper zone pulmonary vascular prominence suggesting  pulmonary venous hypertension, but no overt edema. Lower thoracic spondylosis. No pneumonia identified.  No blunting of the costophrenic angles. IMPRESSION: 1. Mild to moderate enlargement of the cardiopericardial silhouette with pulmonary venous hypertension but no overt edema. 2. Right IJ dialysis catheter tip: Right atrium. 3. Lower thoracic spondylosis. Electronically Signed   By: Van Clines M.D.   On: 10/09/2022 14:14   CT Head Wo Contrast  Result Date: 10/09/2022 CLINICAL DATA:  Head trauma EXAM: CT HEAD WITHOUT CONTRAST TECHNIQUE: Contiguous axial images were obtained from the base of the skull through the vertex without intravenous contrast. RADIATION DOSE REDUCTION: This exam was performed according to the departmental dose-optimization program which includes automated exposure control, adjustment of the mA and/or kV according to patient size and/or use of iterative reconstruction technique. COMPARISON:  CT dated June 14, 2022 FINDINGS: Brain: Chronic white matter ischemic change. No evidence of acute infarction, hemorrhage, hydrocephalus, extra-axial collection or mass lesion/mass effect. Vascular: No hyperdense vessel or unexpected calcification. Skull: Normal. Negative for fracture or focal lesion. Sinuses/Orbits: Mucosal thickening of the right sphenoid sinus findings can be seen the setting of chronic sinusitis. No acute abnormality. Other: None. IMPRESSION: No acute intracranial abnormality. Electronically Signed   By: Yetta Glassman M.D.   On: 10/09/2022 13:14    Pertinent labs & imaging results that were available during my care of the patient were reviewed by me and considered in  my medical decision making (see MDM for details).  Medications Ordered in ED Medications  ipratropium-albuterol (DUONEB) 0.5-2.5 (3) MG/3ML nebulizer solution 3 mL (3 mLs Nebulization Given 10/09/22 1216)                                                                                                                                     Procedures .Critical Care  Performed by: Teressa Lower, MD Authorized by: Teressa Lower, MD   Critical care provider statement:    Critical care time (minutes):  30   Critical care was necessary to treat or prevent imminent or life-threatening deterioration of the following conditions:  Respiratory failure   Critical care was time spent personally by me on the following activities:  Development of treatment plan with patient or surrogate, discussions with consultants, evaluation of patient's response to treatment, examination of patient, ordering and review of laboratory studies, ordering and review of radiographic studies, ordering and performing treatments and interventions, pulse oximetry, re-evaluation of patient's condition and review of old charts   (including critical care time)  Medical Decision Making / ED Course   This patient presents to the ED for concern of fatigue, shortness of breath, this involves an extensive number of treatment options, and is a complaint that carries with it a high risk of complications and morbidity.  The differential diagnosis includes fluid overload, anemia, electrolyte abnormality, infection, ACS  MDM: Patient on multiple complaints described above.  Physical exam reveals an ill-appearing patient with rales  in bilateral bases and lower extremity edema.  Laboratory evaluation with a significant leukocytosis to 23.7 but patient is currently on steroids since her last COPD exacerbation last hospital admission.  Initial pH 7.38 with no significant hypercarbia.  BUN is 80 with a creatinine of 9.32.  BNP is greater than  4500.  Chest x-ray with cardiomegaly and vascular congestion but no overt pulmonary edema.  CT head with no intracranial bleed.  patient received 1 DuoNeb for some faint wheezing on exam.  Urinalysis currently pending.  I spoke with the nephrologist on-call Dr. Joelyn Oms who will help arrange inpatient dialysis.  Patient placed on 2 L nasal cannula as she is satting 90% on room air.  Patient will require hospital admission for generalized fatigue with fluid overload and need for dialysis.  Patient then admitted.   Additional history obtained:  -External records from outside source obtained and reviewed including: Chart review including previous notes, labs, imaging, consultation notes   Lab Tests: -I ordered, reviewed, and interpreted labs.   The pertinent results include:   Labs Reviewed  CBC WITH DIFFERENTIAL/PLATELET - Abnormal; Notable for the following components:      Result Value   WBC 23.7 (*)    Hemoglobin 11.9 (*)    RDW 20.2 (*)    Neutro Abs 20.5 (*)    Monocytes Absolute 1.6 (*)    Abs Immature Granulocytes 0.24 (*)    All other components within normal limits  COMPREHENSIVE METABOLIC PANEL - Abnormal; Notable for the following components:   Chloride 97 (*)    Glucose, Bld 271 (*)    BUN 80 (*)    Creatinine, Ser 9.32 (*)    ALT 115 (*)    Alkaline Phosphatase 139 (*)    GFR, Estimated 4 (*)    All other components within normal limits  BRAIN NATRIURETIC PEPTIDE - Abnormal; Notable for the following components:   B Natriuretic Peptide >4,500.0 (*)    All other components within normal limits  BLOOD GAS, VENOUS - Abnormal; Notable for the following components:   pCO2, Ven 40 (*)    pO2, Ven 57 (*)    All other components within normal limits  TROPONIN I (HIGH SENSITIVITY) - Abnormal; Notable for the following components:   Troponin I (High Sensitivity) 83 (*)    All other components within normal limits  URINALYSIS, ROUTINE W REFLEX MICROSCOPIC  TROPONIN I (HIGH  SENSITIVITY)      Imaging Studies ordered: I ordered imaging studies including chest x-ray, CT head I independently visualized and interpreted imaging. I agree with the radiologist interpretation   Medicines ordered and prescription drug management: Meds ordered this encounter  Medications   ipratropium-albuterol (DUONEB) 0.5-2.5 (3) MG/3ML nebulizer solution 3 mL    -I have reviewed the patients home medicines and have made adjustments as needed  Critical interventions Oxygen supplementation, nephrology consultation for dialysis  Consultations Obtained: I requested consultation with the nephrologist Dr. Carmina Miller,  and discussed lab and imaging findings as well as pertinent plan - they recommend: Hospital admission, dialysis   Cardiac Monitoring: The patient was maintained on a cardiac monitor.  I personally viewed and interpreted the cardiac monitored which showed an underlying rhythm of: NSR  Social Determinants of Health:  Factors impacting patients care include: none   Reevaluation: After the interventions noted above, I reevaluated the patient and found that they have :improved  Co morbidities that complicate the patient evaluation  Past Medical History:  Diagnosis Date   Acute  on chronic diastolic CHF (congestive heart failure) ([place]) 03/05/2015   Grade 2. EF 60-65%   Anemia    hx of   Anxiety    Arthritis    CAD (coronary artery disease)    s/p Stenting in 2005;  [place] 7/09: Vigorous LV function, 2-3+ MR, RI 40%, RI stent patent, proximal-mid RCA 40-50%;  [place] 7/09:  EF 55-65%, trivial MR;  [facility] 11/18/12: EF 66%, normal LV wall motion, mild to moderate anterior ischemia - reviewed by [facility] and felt to rep breast atten (low risk);  Echo 6/14: mild LVH, EF 55-65%, Gr 2 DD, PASP 44, trivial eff    Chronic kidney disease    CREATININE IS UP--GOING TO KIDNEY MD    Chronic neck pain    COPD (chronic obstructive pulmonary disease) ([facility])    Degenerative joint disease     left shoulder   Diabetic neuropathy ([facility])    Diverticulosis    Esophageal stricture    GERD (gastroesophageal reflux disease)    Hyperlipidemia    Hypertension    IDDM (insulin dependent diabetes mellitus)    Pericardial effusion 03/06/2015   Very small per echo ; pleural nodules seen on CT chest.   Peripheral vascular disease ([place])    Stroke ([place])    "mini- years ago"   Vitamin D deficiency       Dispostion: I considered admission for this patient, and due to persistent shortness of breath and need for urgent dialysis, patient require hospital admission     Final Clinical Impression(s) / ED Diagnoses Final diagnoses:  Hypervolemia, unspecified hypervolemia type     @PCDICTATION @    Teressa Lower, MD 10/09/22 1521

## 2022-10-09 NOTE — H&P (Addendum)
TRH H&P    Patient Demographics:    [name], is a 71 y.o. female  MRN: [id number]  DOB - 1951/11/05  Admit Date - 10/09/2022  Referring MD/NP/PA: Dr. De Burrs  Outpatient Primary MD for the patient is [name], MD  Patient coming from: Home  Chief complaint-generalized weakness   HPI:    [name]  is a 71 y.o. female, with past medical history of ESRD on hemodialysis Tuesday Thursday and Saturday, diastolic CHF, atrial fibrillation, recent hospitalization was discharged on 10/04/2022 presented to the ED with generalized fatigue and shortness of breath.  Patient said that she was well after discharge but in the last 24 hours she was feeling extremely weak, on Saturday she fell.  Denies passing out but admits to hitting her head.  Complains of chest tightness.  No nausea vomiting or diarrhea.  Patient is on iron supplementation and complains of black stool.  She is also on Eliquis for atrial fibrillation.  In the ED CT head was negative for stroke or hemorrhage Nephrology was consulted for hemodialysis Patient was found to have BNP elevated more than 4500, 2 L oxygen via nasal cannula applied for acute hypoxemic respiratory failure.       Review of systems:    In addition to the HPI above,    All other systems reviewed and are negative.    Past History of the following :    Past Medical History:  Diagnosis Date   Acute on chronic diastolic CHF (congestive heart failure) ([place]) 03/05/2015   Grade 2. EF 60-65%   Anemia    hx of   Anxiety    Arthritis    CAD (coronary artery disease)    s/p Stenting in 2005;  [place] 7/09: Vigorous LV function, 2-3+ MR, RI 40%, RI stent patent, proximal-mid RCA 40-50%;  Echo 7/09:  EF 55-65%, trivial MR;  [facility] 11/18/12: EF 66%, normal LV wall motion, mild to moderate anterior ischemia - reviewed by [facility] and felt to rep breast atten (low risk);  Echo 6/14:  mild LVH, EF 55-65%, Gr 2 DD, PASP 44, trivial eff    Chronic kidney disease    CREATININE IS UP--GOING TO KIDNEY MD    Chronic neck pain    COPD (chronic obstructive pulmonary disease) (HCC)    Degenerative joint disease    left shoulder   Diabetic neuropathy (HCC)    Diverticulosis    Esophageal stricture    GERD (gastroesophageal reflux disease)    Hyperlipidemia    Hypertension    IDDM (insulin dependent diabetes mellitus)    Pericardial effusion 03/06/2015   Very small per echo ; pleural nodules seen on CT chest.   Peripheral vascular disease ([place])    Stroke ([place])    "mini- years ago"   Vitamin D deficiency       Past Surgical History:  Procedure Laterality Date   A/V FISTULAGRAM N/A 08/10/2017   Procedure: A/V Fistulagram - Right Arm;  Surgeon: Elam Dutch, MD;  Location: [place] [facility];  Service: Cardiovascular;  Laterality: N/A;   A/V FISTULAGRAM N/A 03/03/2020   Procedure: A/V FISTULAGRAM - Right Arm;  Surgeon: Marty Heck, MD;  Location: [place] [facility];  Service: Cardiovascular;  Laterality: N/A;   ABDOMINAL HYSTERECTOMY     AV FISTULA PLACEMENT Right 07/20/2015   Procedure: RADIAL CEPHALIC ARTERIOVENOUS FISTULA CREATION RIGHT ARM;  Surgeon: Angelia Mould, MD;  Location: [place];  Service: Vascular;  Laterality: Right;   AV FISTULA PLACEMENT Right 11/26/2015   Procedure:  Creation of RIGHT BRACHIO-CEPHALIC arteriovenous fistula;  Surgeon: Angelia Mould, MD;  Location: [place];  Service: Vascular;  Laterality: Right;   AV FISTULA PLACEMENT Right 08/15/2022   Procedure: INSERTION OF RIGHT UPPER ARM ARTERIOVENOUS (AV) GORE-TEX GRAFT;  Surgeon: Rosetta Posner, MD;  Location: [facility];  Service: Vascular;  Laterality: Right;   BACK SURGERY     2007 & 2013   [place]  2003, 2005, 2009   1 stent   cardiac stents     CARDIOVERSION N/A 04/24/2022   Procedure: CARDIOVERSION;  Surgeon: Fay Records, MD;   Location: [facility] ENDOSCOPY;  Service: Cardiovascular;  Laterality: N/A;   CATARACT EXTRACTION W/PHACO Right 05/04/2014   Procedure: CATARACT EXTRACTION PHACO AND INTRAOCULAR LENS PLACEMENT ([place]);  Surgeon: Williams Che, MD;  Location: [facility];  Service: Ophthalmology;  Laterality: Right;  CDE 1.47   CATARACT EXTRACTION W/PHACO Left 07/20/2014   Procedure: CATARACT EXTRACTION WITH PHACO AND INTRAOCULAR LENS PLACEMENT LEFT EYE CDE=1.79;  Surgeon: Williams Che, MD;  Location: [facility];  Service: Ophthalmology;  Laterality: Left;   CERVICAL SPINE SURGERY  2012   8 screws   CORONARY ATHERECTOMY N/A 03/08/2022   Procedure: CORONARY ATHERECTOMY;  Surgeon: Sherren Mocha, MD;  Location: [place] [facility];  Service: Cardiovascular;  Laterality: N/A;   CYSTOSCOPY WITH INJECTION N/A 06/03/2013   Procedure: MACROPLASTIQUE  INJECTION ;  Surgeon: Malka So, MD;  Location: [facility];  Service: Urology;  Laterality: N/A;   ESOPHAGEAL DILATION Right 06/02/2022   Procedure: ESOPHAGEAL DILATION;  Surgeon: Harvel Quale, MD;  [facility]: [facility];  Service: Gastroenterology;  Laterality: Right;   ESOPHAGOGASTRODUODENOSCOPY (EGD) WITH PROPOFOL N/A 06/02/2022   Procedure: ESOPHAGOGASTRODUODENOSCOPY (EGD) WITH PROPOFOL;  Surgeon: Harvel Quale, MD;  [facility];  Service: Gastroenterology;  Laterality: N/A;  TO BE DONE IN OR 2   FISTULOGRAM Right 10/20/2016   Procedure: FISTULOGRAM WITH POSSIBLE INTERVENTION;  Surgeon: [name], MD;  Location: [facility];  Service: Vascular;  Laterality: Right;   GIVENS CAPSULE STUDY N/A 06/11/2022   Procedure: GIVENS CAPSULE STUDY;  Surgeon: Clarene Essex, MD;  Location: [place];  Service: Gastroenterology;  Laterality: N/A;   HEMOSTASIS CLIP PLACEMENT  06/02/2022   Procedure: HEMOSTASIS CLIP PLACEMENT;  Surgeon: Harvel Quale, MD;  [facility];  Service: Gastroenterology;;   HOT HEMOSTASIS  06/02/2022   Procedure: HOT  HEMOSTASIS (ARGON PLASMA COAGULATION/BICAP);  Surgeon: Montez Morita, Quillian Quince, MD;  [facility];  Service: Gastroenterology;;   INSERTION OF DIALYSIS CATHETER N/A 11/26/2015   Procedure: INSERTION OF DIALYSIS CATHETER;  Surgeon: Angelia Mould, MD;  Location: [place];  Service: Vascular;  Laterality: N/A;   INSERTION OF DIALYSIS CATHETER Right 08/15/2022   Procedure: INSERTION OF TUNNELED DIALYSIS CATHETER;  Surgeon: Rosetta Posner, MD;  Location: [facility];  Service: Vascular;  Laterality: Right;   INTRAVASCULAR IMAGING/OCT N/A 03/08/2022   Procedure: INTRAVASCULAR  IMAGING/OCT;  Surgeon: Sherren Mocha, MD;  Location: [place] [facility];  Service: Cardiovascular;  Laterality: N/A;   LAMINECTOMY  12/29/2011   LEFT HEART CATH AND CORONARY ANGIOGRAPHY N/A 03/06/2022   Procedure: LEFT HEART CATH AND CORONARY ANGIOGRAPHY;  Surgeon: Nelva Bush, MD;  Location: [place] [facility];  Service: Cardiovascular;  Laterality: N/A;   LUMBAR LAMINECTOMY/DECOMPRESSION MICRODISCECTOMY  12/29/2011   Procedure: LUMBAR LAMINECTOMY/DECOMPRESSION MICRODISCECTOMY;  Surgeon: Floyce Stakes, MD;  Location: [place] [facility];  Service: Neurosurgery;  Laterality: N/A;  Lumbar Two through Lumbar Five Laminectomies/Cellsaver   PARTIAL HYSTERECTOMY     PERIPHERAL VASCULAR BALLOON ANGIOPLASTY  03/03/2020   Procedure: PERIPHERAL VASCULAR BALLOON ANGIOPLASTY;  Surgeon: Marty Heck, MD;  Location: [place] [facility];  Service: Cardiovascular;;  Rt arm fistula   PERIPHERAL VASCULAR BALLOON ANGIOPLASTY Right 04/15/2021   Procedure: PERIPHERAL VASCULAR BALLOON ANGIOPLASTY;  Surgeon: Angelia Mould, MD;  Location: [place] [facility];  Service: Cardiovascular;  Laterality: Right;  arm fistula   PERIPHERAL VASCULAR BALLOON ANGIOPLASTY  03/14/2022   Procedure: PERIPHERAL VASCULAR BALLOON ANGIOPLASTY;  Surgeon: Broadus John, MD;  Location: [place] [facility];  Service: Cardiovascular;;  right arm AVF    PERIPHERAL VASCULAR CATHETERIZATION N/A 11/22/2015   Procedure: Fistulagram;  Surgeon: Angelia Mould, MD;  Location: [place] [facility];  Service: Cardiovascular;  Laterality: N/A;   PERIPHERAL VASCULAR INTERVENTION  08/10/2017   Procedure: PERIPHERAL VASCULAR INTERVENTION;  Surgeon: Elam Dutch, MD;  Location: [place] [facility];  Service: Cardiovascular;;   SHOULDER SURGERY Right    Rotator cuff      Social History:      Social History   Tobacco Use   Smoking status: Former    Packs/day: 1.00    Years: 30.00    Total pack years: 30.00    Types: Cigarettes    Quit date: 12/04/2001    Years since quitting: 20.8    Passive exposure: Never   Smokeless tobacco: Never  Substance Use Topics   Alcohol use: No    Alcohol/week: 6.0 standard drinks of alcohol    Types: 6 Cans of beer per week       Family History :     Family History  Problem Relation Age of Onset   Heart disease Other    Hypertension Father    Cancer Father    Hypertension Mother    Hypertension Daughter    Pancreatitis Brother    Hypertension Daughter    Diabetes Daughter       Home Medications:   Prior to Admission medications   Medication Sig Start Date End Date Taking? Authorizing Provider  acetaminophen (TYLENOL) 500 MG tablet Take 1,000 mg by mouth every 6 (six) hours as needed for moderate pain or headache.    [provider]  albuterol (PROVENTIL) (2.5 MG/3ML) 0.083% nebulizer solution Take 3 mLs (2.5 mg total) by nebulization every 6 (six) hours as needed for wheezing or shortness of breath. 12/02/21   [name], MD  amiodarone (PACERONE) 200 MG tablet Take 1 tablet by mouth twice a day for 2 weeks then reduce to 1 tablet daily Patient taking differently: Take 200 mg by mouth 2 (two) times daily. 09/06/22   Fenton, Clint R, PA  apixaban (ELIQUIS) 5 MG TABS tablet Take 1 tablet (5 mg total) by mouth 2 (two) times daily. 04/05/22   Minus Breeding, MD  aspirin EC 81 MG  tablet Take 1 tablet (81 mg total) by  mouth daily. Swallow whole. 06/15/22   Vashti Hey, MD  atenolol (TENORMIN) 100 MG tablet Take 0.5 tablets (50 mg total) by mouth at bedtime. 08/02/22   Sherran Needs, NP  budesonide-formoterol [facility]) 160-4.5 MCG/ACT inhaler Inhale 2 puffs into the lungs 2 (two) times daily. Patient taking differently: Inhale 2 puffs into the lungs 2 (two) times daily as needed (shortness of breath). 05/03/22   [name], MD  Carboxymethylcellul-Glycerin (LUBRICATING EYE DROPS OP) Place 1 drop into both eyes daily as needed (dry eyes).    [provider]  cetirizine (ZYRTEC) 10 MG tablet Take 1 tablet (10 mg total) by mouth daily as needed for allergies. Patient taking differently: Take 10 mg by mouth daily. 06/15/22   Vashti Hey, MD  Continuous Blood Gluc Receiver (FREESTYLE LIBRE 2 READER) DEVI USE TO TEST BLOOD SUGAR CONTINUOUSLY 11/15/21   [name], MD  Continuous Blood Gluc Sensor (FREESTYLE LIBRE 2 SENSOR) MISC Use multiple times daily to track glucose to prevent highs and lows as a complication of dialysis Dx:Z99.2, E11.59 09/15/21   [name], MD  diltiazem (CARDIZEM CD) 240 MG 24 hr capsule Take 1 capsule (240 mg total) by mouth daily. 05/17/22   Minus Breeding, MD  DULoxetine (CYMBALTA) 30 MG capsule TAKE 1 CAPSULE BY MOUTH ONCE DAILY WITH SUPPER Patient taking differently: Take 30 mg by mouth every evening. 09/04/22   [name], MD  Ferrous Fumarate (HEMOCYTE - 106 MG FE) 324 (106 Fe) MG TABS tablet Take 1 tablet (106 mg of iron total) by mouth daily. 06/28/22   [name], MD  gabapentin (NEURONTIN) 400 MG capsule TAKE 2 CAPSULES AFTER EACH DIALYSIS SESSION Patient taking differently: Take 800 mg by mouth See admin instructions. Take 800 mg by mouth Tuesday, Thursday and Saturday after dialysis 07/27/22   [name], MD  glucose blood (ONETOUCH ULTRA) test strip Use to test blood sugar 3 times daily as  directed. DX: E11.9 09/13/20   [name], MD  insulin aspart (NOVOLOG FLEXPEN) 100 UNIT/ML FlexPen Inject 1 Units into the skin 3 (three) times daily with meals. < 150: 0 units. 150-199:1 unit. 200-249: 2 units. 250-299: 3 units. 300-349: 4 units. Over 350: 5 units 05/19/22   Rakes, Connye Burkitt, FNP  insulin degludec (TRESIBA FLEXTOUCH) 100 UNIT/ML FlexTouch Pen Inject 20 Units into the skin at bedtime. Via novo nordisk patient assistance program Patient taking differently: Inject 25 Units into the skin at bedtime. Via novo nordisk patient assistance program 05/03/22   [name], MD  lanthanum (FOSRENOL) 750 MG chewable tablet SMARTSIG:2 Tablet(s) By Mouth Patient not taking: Reported on 09/29/2022 09/21/22   [provider]  multivitamin (RENA-VIT) TABS tablet Take 1 tablet by mouth daily. 05/13/22   [provider]  nitroGLYCERIN (NITROSTAT) 0.4 MG SL tablet Place 1 tablet (0.4 mg total) under the tongue every 5 (five) minutes as needed for chest pain. 11/09/21   Minus Breeding, MD  ondansetron (ZOFRAN) 4 MG tablet TAKE 1 TABLET BY MOUTH EVERY 8 HOURS AS NEEDED FOR NAUSEA AND VOMITING Patient taking differently: Take 4 mg by mouth every 8 (eight) hours as needed for nausea or vomiting. TAKE 1 TABLET BY MOUTH EVERY 8 HOURS AS NEEDED FOR NAUSEA AND VOMITING 07/03/22   [name], MD  polyethylene glycol (MIRALAX / GLYCOLAX) 17 g packet Take 17 g by mouth daily as needed for moderate constipation or mild constipation. 03/10/22   Pokhrel, Corrie Mckusick, MD  predniSONE (DELTASONE) 20 MG tablet Take 2  tablets (40 mg total) by mouth daily with breakfast for 5 days. 10/05/22 10/10/22  Manuella Ghazi, Pratik D, DO  rOPINIRole (REQUIP) 1 MG tablet Take 1 mg by mouth at bedtime.    [provider]  rosuvastatin (CRESTOR) 5 MG tablet Take 1 tablet (5 mg total) by mouth daily. For cholesterol Patient not taking: Reported on 09/29/2022 12/12/21   [name], MD  Semaglutide,0.25 or 0.5MG /DOS,  (OZEMPIC, 0.25 OR 0.5 MG/DOSE,) 2 MG/1.5ML SOPN Inject 0.5 mg into the skin every 7 (seven) days. Saturday    [provider]  torsemide (DEMADEX) 20 MG tablet Take 20 mg by mouth daily. 07/02/22   [provider]     Allergies:     Allergies  Allergen Reactions   Azithromycin     Prolonged QT   Ace Inhibitors Cough   Clopidogrel Bisulfate Other (See Comments)    Sick, Headache, "Felt terrible"   Codeine Other (See Comments)    hallucinations   Lisinopril Cough   Penicillins Other (See Comments)    Unknown childhood reaction Did it involve swelling of the face/tongue/throat, SOB, or low BP? Unknown Did it involve sudden or severe rash/hives, skin peeling, or any reaction on the inside of your mouth or nose? Unknown Did you need to seek medical attention at a hospital or doctor's office? Unknown When did it last happen?      childhood allergy If all above answers are "NO", may proceed with cephalosporin use.       Physical Exam:   Vitals  Blood pressure (!) 144/111, pulse 74, temperature 97.8 F (36.6 C), temperature source Oral, resp. rate 19, height 5\' 3"  (1.6 m), weight 79.4 kg, SpO2 98 %.  1.  General: Appears in no acute distress  2. Psychiatric: Somnolent but arousable, oriented to time place and person  3. Neurologic: Cranial nerves II through XII grossly intact, no focal deficit noted  4. HEENMT:  Atraumatic normocephalic, extraocular muscles are intact  5. Respiratory : Bilateral scattered wheezing auscultated  6. Cardiovascular : S1-S2, regular, no murmur auscultated  7. Gastrointestinal:  Abdomen is soft, nontender, no organomegaly  8. Skin:  Skin is warm to touch, no rashes noted  9.Musculoskeletal:  No edema in the lower extremities    Data Review:    CBC Recent Labs  Lab 10/04/22 0459 10/09/22 1254  WBC 18.7* 23.7*  HGB 12.9 11.9*  HCT 40.1 36.7  PLT 323 203  MCV 85.1 86.2  MCH 27.4 27.9  MCHC 32.2 32.4  RDW  19.9* 20.2*  LYMPHSABS  --  1.4  MONOABS  --  1.6*  EOSABS  --  0.0  BASOSABS  --  0.0   ------------------------------------------------------------------------------------------------------------------  Results for orders placed or performed during the hospital encounter of 10/09/22 (from the past 48 hour(s))  Urinalysis, Routine w reflex microscopic Urine, In & Out Cath     Status: Abnormal   Collection Time: 10/09/22 11:04 AM  Result Value Ref Range   Color, Urine AMBER (A) YELLOW    Comment: BIOCHEMICALS MAY BE AFFECTED BY COLOR   APPearance CLOUDY (A) CLEAR   Specific Gravity, Urine 1.015 1.005 - 1.030   pH 6.0 5.0 - 8.0   Glucose, UA >=500 (A) NEGATIVE mg/dL   Hgb urine dipstick LARGE (A) NEGATIVE   Bilirubin Urine NEGATIVE NEGATIVE   Ketones, ur NEGATIVE NEGATIVE mg/dL   Protein, ur 100 (A) NEGATIVE mg/dL   Nitrite NEGATIVE NEGATIVE   Leukocytes,Ua NEGATIVE NEGATIVE   RBC / HPF >  50 (H) 0 - 5 RBC/hpf   Bacteria, UA RARE (A) NONE SEEN    Comment: Performed at [facility], [address] [place], [place] [address]  CBC with Differential     Status: Abnormal   Collection Time: 10/09/22 12:54 PM  Result Value Ref Range   WBC 23.7 (H) 4.0 - 10.5 K/uL   RBC 4.26 3.87 - 5.11 MIL/uL   Hemoglobin 11.9 (L) 12.0 - 15.0 g/dL   HCT 36.7 36.0 - 46.0 %   MCV 86.2 80.0 - 100.0 fL   MCH 27.9 26.0 - 34.0 pg   MCHC 32.4 30.0 - 36.0 g/dL   RDW 20.2 (H) 11.5 - 15.5 %   Platelets 203 150 - 400 K/uL   nRBC 0.2 0.0 - 0.2 %   Neutrophils Relative % 86 %   Neutro Abs 20.5 (H) 1.7 - 7.7 K/uL   Lymphocytes Relative 6 %   Lymphs Abs 1.4 0.7 - 4.0 K/uL   Monocytes Relative 7 %   Monocytes Absolute 1.6 (H) 0.1 - 1.0 K/uL   Eosinophils Relative 0 %   Eosinophils Absolute 0.0 0.0 - 0.5 K/uL   Basophils Relative 0 %   Basophils Absolute 0.0 0.0 - 0.1 K/uL   Immature Granulocytes 1 %   Abs Immature Granulocytes 0.24 (H) 0.00 - 0.07 K/uL    Comment: Performed at [facility], [address] [place], [place] [address]  Troponin I (High Sensitivity)     Status: Abnormal   Collection Time: 10/09/22 12:54 PM  Result Value Ref Range   Troponin I (High Sensitivity) 83 (H) <18 ng/L    Comment: (NOTE) Elevated high sensitivity troponin I (hsTnI) values and significant  changes across serial measurements may suggest ACS but many other  chronic and acute conditions are known to elevate hsTnI results.  Refer to the "Links" section for chest pain algorithms and additional  guidance. Performed at [facility], [address] [place], [place] [address]   Comprehensive metabolic panel     Status: Abnormal   Collection Time: 10/09/22 12:54 PM  Result Value Ref Range   Sodium 137 135 - 145 mmol/L   Potassium 4.6 3.5 - 5.1 mmol/L   Chloride 97 (L) 98 - 111 mmol/L   CO2 25 22 - 32 mmol/L   Glucose, Bld 271 (H) 70 - 99 mg/dL    Comment: Glucose reference range applies only to samples taken after fasting for at least 8 hours.   BUN 80 (H) 8 - 23 mg/dL   Creatinine, Ser 9.32 (H) 0.44 - 1.00 mg/dL   Calcium 10.0 8.9 - 10.3 mg/dL   Total Protein 6.6 6.5 - 8.1 g/dL   Albumin 3.7 3.5 - 5.0 g/dL   AST 39 15 - 41 U/L   ALT 115 (H) 0 - 44 U/L   Alkaline Phosphatase 139 (H) 38 - 126 U/L   Total Bilirubin 1.2 0.3 - 1.2 mg/dL   GFR, Estimated 4 (L) >60 mL/min    Comment: (NOTE) Calculated using the CKD-EPI Creatinine Equation (2021)    Anion gap 15 5 - 15    Comment: Performed at [facility], [address], [place], [place] [address]  Brain natriuretic peptide     Status: Abnormal   Collection Time: 10/09/22 12:54 PM  Result Value Ref Range   B Natriuretic Peptide >4,500.0 (H) 0.0 - 100.0 pg/mL    Comment: Performed at [facility], [address] [place], [place] [address]  Blood gas, venous (  at [facility] and [facility], not at [facility])     Status: Abnormal   Collection Time: 10/09/22  1:33 PM  Result Value Ref Range   pH, Ven 7.38 7.25 - 7.43   pCO2, Ven 40 (L) 44 - 60 mmHg   pO2, Ven 57 (H) 32 - 45  mmHg   Bicarbonate 23.7 20.0 - 28.0 mmol/L   Acid-base deficit 1.5 0.0 - 2.0 mmol/L   O2 Saturation 88.1 %   Patient temperature 36.5    Collection site BLOOD LEFT HAND    Drawn by [phone]     Comment: Performed at [facility], [address] [place], [place] [address]    Chemistries  Recent Labs  Lab 10/03/22 0410 10/04/22 0459 10/09/22 1254  NA 129* 133* 137  K 4.3 4.6 4.6  CL 91* 92* 97*  CO2 21* 26 25  GLUCOSE 179* 179* 271*  BUN 61* 49* 80*  CREATININE 9.51* 7.04* 9.32*  CALCIUM 10.2 9.8 10.0  AST  --   --  39  ALT  --   --  115*  ALKPHOS  --   --  139*  BILITOT  --   --  1.2   ------------------------------------------------------------------------------------------------------------------  ------------------------------------------------------------------------------------------------------------------ GFR: Estimated Creatinine Clearance: 5.6 mL/min (A) (by C-G formula based on SCr of 9.32 mg/dL (H)). Liver Function Tests: Recent Labs  Lab 10/03/22 0410 10/09/22 1254  AST  --  39  ALT  --  115*  ALKPHOS  --  139*  BILITOT  --  1.2  PROT  --  6.6  ALBUMIN 3.5 3.7   No results for input(s): "LIPASE", "AMYLASE" in the last 168 hours. No results for input(s): "AMMONIA" in the last 168 hours. Coagulation Profile: No results for input(s): "INR", "PROTIME" in the last 168 hours. Cardiac Enzymes: No results for input(s): "CKTOTAL", "CKMB", "CKMBINDEX", "TROPONINI" in the last 168 hours. BNP (last 3 results) No results for input(s): "PROBNP" in the last 8760 hours. HbA1C: No results for input(s): "HGBA1C" in the last 72 hours. CBG: Recent Labs  Lab 10/03/22 0757 10/03/22 1630 10/03/22 2019 10/04/22 0731 10/04/22 1151  GLUCAP 195* 251* 346* 181* 295*   Lipid Profile: No results for input(s): "CHOL", "HDL", "LDLCALC", "TRIG", "CHOLHDL", "LDLDIRECT" in the last 72 hours. Thyroid Function Tests: No results for input(s): "TSH", "T4TOTAL", "FREET4",  "T3FREE", "THYROIDAB" in the last 72 hours. Anemia Panel: No results for input(s): "VITAMINB12", "FOLATE", "FERRITIN", "TIBC", "IRON", "RETICCTPCT" in the last 72 hours.  --------------------------------------------------------------------------------------------------------------- Urine analysis:    Component Value Date/Time   COLORURINE AMBER (A) 10/09/2022 1104   APPEARANCEUR CLOUDY (A) 10/09/2022 1104   APPEARANCEUR Cloudy (A) 06/28/2022 1054   LABSPEC 1.015 10/09/2022 1104   PHURINE 6.0 10/09/2022 1104   GLUCOSEU >=500 (A) 10/09/2022 1104   HGBUR LARGE (A) 10/09/2022 1104   BILIRUBINUR NEGATIVE 10/09/2022 1104   BILIRUBINUR Negative 06/28/2022 1054   KETONESUR NEGATIVE 10/09/2022 1104   PROTEINUR 100 (A) 10/09/2022 1104   UROBILINOGEN 0.2 01/03/2012 1650   NITRITE NEGATIVE 10/09/2022 1104   LEUKOCYTESUR NEGATIVE 10/09/2022 1104      Imaging Results:    DG Chest 2 View  Result Date: 10/09/2022 CLINICAL DATA:  Weakness, possible pneumonia. History of COPD and congestive heart failure. Dialysis patient. EXAM: CHEST - 2 VIEW COMPARISON:  10/01/2022 FINDINGS: Right IJ dialysis catheter tip: Right atrium. Lower cervical plate and screw fixator. Atherosclerotic calcification of the aortic arch. Mild to moderate enlargement of the cardiopericardial silhouette with upper zone pulmonary vascular prominence suggesting pulmonary  venous hypertension, but no overt edema. Lower thoracic spondylosis. No pneumonia identified.  No blunting of the costophrenic angles. IMPRESSION: 1. Mild to moderate enlargement of the cardiopericardial silhouette with pulmonary venous hypertension but no overt edema. 2. Right IJ dialysis catheter tip: Right atrium. 3. Lower thoracic spondylosis. Electronically Signed   By: Van Clines M.D.   On: 10/09/2022 14:14   CT Head Wo Contrast  Result Date: 10/09/2022 CLINICAL DATA:  Head trauma EXAM: CT HEAD WITHOUT CONTRAST TECHNIQUE: Contiguous axial images were  obtained from the base of the skull through the vertex without intravenous contrast. RADIATION DOSE REDUCTION: This exam was performed according to the departmental dose-optimization program which includes automated exposure control, adjustment of the mA and/or kV according to patient size and/or use of iterative reconstruction technique. COMPARISON:  CT dated June 14, 2022 FINDINGS: Brain: Chronic white matter ischemic change. No evidence of acute infarction, hemorrhage, hydrocephalus, extra-axial collection or mass lesion/mass effect. Vascular: No hyperdense vessel or unexpected calcification. Skull: Normal. Negative for fracture or focal lesion. Sinuses/Orbits: Mucosal thickening of the right sphenoid sinus findings can be seen the setting of chronic sinusitis. No acute abnormality. Other: None. IMPRESSION: No acute intracranial abnormality. Electronically Signed   By: Yetta Glassman M.D.   On: 10/09/2022 13:14      Assessment & Plan:    Principal Problem:   Acute hypoxemic respiratory failure (HCC)   Acute hypoxemic respiratory failure-likely from volume overload and COPD exacerbation.  BNP elevated 4500.  Nephrology has been consulted for hemodialysis.  Continue oxygen via nasal cannula. COPD exacerbation-patient was discharged on prednisone taper on November 1.  Still has bilateral wheezing on auscultation.  We will start DuoNeb every 6 hours scheduled and Solu-Medrol 40 mg IV every 12 hours. Acute on chronic diastolic heart failure-BNP significantly elevated 4500, last EF 55 to 60%.  Will need dialysis as above. Leukocytosis-WBC elevated to 23,000, patient was discharged on prednisone taper.  No signs and symptoms of infection Generalized weakness/status post fall-likely from volume overload and COPD exacerbation.  Denies passing out.  CT head negative for hemorrhage or stroke.  Consider PT evaluation once patient is stable. ESRD on hemodialysis-patient on hemodialysis TTS, nephrology  consulted for dialysis as above. Paroxysmal atrial fibrillation-heart rate is controlled, will monitor on telemetry.  Continue amiodarone, diltiazem, Tenormin and anticoagulation with Eliquis. Diabetes mellitus type 2-continue Tresiba 25 units subcu daily.  Start sliding scale insulin with NovoLog History of prolonged QTc-patient has history of prolonged QTc interval.  EKG obtained shows QTc intervals of 481 ms.  Atrial fibrillation and left axis deviation.  Unchanged from previous EKG.  Will monitor on telemetry.   DVT Prophylaxis-   apixaban  AM Labs Ordered, also please review Full Orders  Family Communication: Admission, patients condition and plan of care including tests being ordered have been discussed with the patient  who indicate understanding and agree with the plan and Code Status.  Code Status: Full code  Admission status: Observation  Time spent in minutes : LaytonD

## 2022-10-09 NOTE — ED Notes (Signed)
Call to pt daughter Abigail Butts to advise of patient admission.

## 2022-10-09 NOTE — ED Triage Notes (Signed)
Pt arrived via [facility] with c/o generalized weakness, states she fell Saturday and hit her head and since then has developed a headache, pt does take blood thinners. Hx of COPD, CHF, dialysis pt

## 2022-10-09 NOTE — ED Notes (Signed)
Per Dr. Abram Sander, MD, he spoke to nephrology and states that nephrology stated pt can start dialysis tomorrow, 10/10/2022

## 2022-10-10 ENCOUNTER — Observation Stay (HOSPITAL_COMMUNITY): Payer: Medicare Other

## 2022-10-10 DIAGNOSIS — R188 Other ascites: Secondary | ICD-10-CM | POA: Diagnosis not present

## 2022-10-10 DIAGNOSIS — I5033 Acute on chronic diastolic (congestive) heart failure: Secondary | ICD-10-CM | POA: Diagnosis present

## 2022-10-10 DIAGNOSIS — J441 Chronic obstructive pulmonary disease with (acute) exacerbation: Secondary | ICD-10-CM | POA: Diagnosis present

## 2022-10-10 DIAGNOSIS — R319 Hematuria, unspecified: Secondary | ICD-10-CM | POA: Diagnosis not present

## 2022-10-10 DIAGNOSIS — Z992 Dependence on renal dialysis: Secondary | ICD-10-CM

## 2022-10-10 DIAGNOSIS — I4821 Permanent atrial fibrillation: Secondary | ICD-10-CM

## 2022-10-10 DIAGNOSIS — E782 Mixed hyperlipidemia: Secondary | ICD-10-CM | POA: Diagnosis present

## 2022-10-10 DIAGNOSIS — I5082 Biventricular heart failure: Secondary | ICD-10-CM | POA: Diagnosis present

## 2022-10-10 DIAGNOSIS — I132 Hypertensive heart and chronic kidney disease with heart failure and with stage 5 chronic kidney disease, or end stage renal disease: Secondary | ICD-10-CM | POA: Diagnosis present

## 2022-10-10 DIAGNOSIS — D72829 Elevated white blood cell count, unspecified: Secondary | ICD-10-CM | POA: Diagnosis present

## 2022-10-10 DIAGNOSIS — R41841 Cognitive communication deficit: Secondary | ICD-10-CM | POA: Diagnosis not present

## 2022-10-10 DIAGNOSIS — I700 Atherosclerosis of aorta: Secondary | ICD-10-CM | POA: Diagnosis not present

## 2022-10-10 DIAGNOSIS — N250 Renal osteodystrophy: Secondary | ICD-10-CM | POA: Diagnosis not present

## 2022-10-10 DIAGNOSIS — E1169 Type 2 diabetes mellitus with other specified complication: Secondary | ICD-10-CM | POA: Diagnosis not present

## 2022-10-10 DIAGNOSIS — K219 Gastro-esophageal reflux disease without esophagitis: Secondary | ICD-10-CM | POA: Diagnosis present

## 2022-10-10 DIAGNOSIS — I25118 Atherosclerotic heart disease of native coronary artery with other forms of angina pectoris: Secondary | ICD-10-CM | POA: Diagnosis present

## 2022-10-10 DIAGNOSIS — R2681 Unsteadiness on feet: Secondary | ICD-10-CM | POA: Diagnosis not present

## 2022-10-10 DIAGNOSIS — J9611 Chronic respiratory failure with hypoxia: Secondary | ICD-10-CM | POA: Diagnosis not present

## 2022-10-10 DIAGNOSIS — E8770 Fluid overload, unspecified: Secondary | ICD-10-CM | POA: Diagnosis present

## 2022-10-10 DIAGNOSIS — J9621 Acute and chronic respiratory failure with hypoxia: Secondary | ICD-10-CM

## 2022-10-10 DIAGNOSIS — J9601 Acute respiratory failure with hypoxia: Secondary | ICD-10-CM | POA: Diagnosis not present

## 2022-10-10 DIAGNOSIS — D631 Anemia in chronic kidney disease: Secondary | ICD-10-CM | POA: Diagnosis present

## 2022-10-10 DIAGNOSIS — Y92009 Unspecified place in unspecified non-institutional (private) residence as the place of occurrence of the external cause: Secondary | ICD-10-CM | POA: Diagnosis not present

## 2022-10-10 DIAGNOSIS — I10 Essential (primary) hypertension: Secondary | ICD-10-CM | POA: Diagnosis not present

## 2022-10-10 DIAGNOSIS — N261 Atrophy of kidney (terminal): Secondary | ICD-10-CM | POA: Diagnosis not present

## 2022-10-10 DIAGNOSIS — I340 Nonrheumatic mitral (valve) insufficiency: Secondary | ICD-10-CM | POA: Diagnosis present

## 2022-10-10 DIAGNOSIS — R531 Weakness: Secondary | ICD-10-CM | POA: Diagnosis not present

## 2022-10-10 DIAGNOSIS — Z794 Long term (current) use of insulin: Secondary | ICD-10-CM | POA: Diagnosis not present

## 2022-10-10 DIAGNOSIS — R2689 Other abnormalities of gait and mobility: Secondary | ICD-10-CM | POA: Diagnosis not present

## 2022-10-10 DIAGNOSIS — E1165 Type 2 diabetes mellitus with hyperglycemia: Secondary | ICD-10-CM | POA: Diagnosis present

## 2022-10-10 DIAGNOSIS — N186 End stage renal disease: Secondary | ICD-10-CM | POA: Diagnosis present

## 2022-10-10 DIAGNOSIS — E1151 Type 2 diabetes mellitus with diabetic peripheral angiopathy without gangrene: Secondary | ICD-10-CM | POA: Diagnosis present

## 2022-10-10 DIAGNOSIS — Z9981 Dependence on supplemental oxygen: Secondary | ICD-10-CM | POA: Diagnosis not present

## 2022-10-10 DIAGNOSIS — M6281 Muscle weakness (generalized): Secondary | ICD-10-CM | POA: Diagnosis not present

## 2022-10-10 DIAGNOSIS — W08XXXA Fall from other furniture, initial encounter: Secondary | ICD-10-CM | POA: Diagnosis present

## 2022-10-10 DIAGNOSIS — E785 Hyperlipidemia, unspecified: Secondary | ICD-10-CM | POA: Diagnosis not present

## 2022-10-10 DIAGNOSIS — F419 Anxiety disorder, unspecified: Secondary | ICD-10-CM | POA: Diagnosis present

## 2022-10-10 DIAGNOSIS — E669 Obesity, unspecified: Secondary | ICD-10-CM | POA: Diagnosis present

## 2022-10-10 DIAGNOSIS — I2510 Atherosclerotic heart disease of native coronary artery without angina pectoris: Secondary | ICD-10-CM | POA: Diagnosis not present

## 2022-10-10 DIAGNOSIS — R627 Adult failure to thrive: Secondary | ICD-10-CM | POA: Diagnosis present

## 2022-10-10 DIAGNOSIS — E1122 Type 2 diabetes mellitus with diabetic chronic kidney disease: Secondary | ICD-10-CM | POA: Diagnosis present

## 2022-10-10 DIAGNOSIS — E1140 Type 2 diabetes mellitus with diabetic neuropathy, unspecified: Secondary | ICD-10-CM | POA: Diagnosis present

## 2022-10-10 DIAGNOSIS — I2489 Other forms of acute ischemic heart disease: Secondary | ICD-10-CM | POA: Diagnosis present

## 2022-10-10 DIAGNOSIS — N2581 Secondary hyperparathyroidism of renal origin: Secondary | ICD-10-CM | POA: Diagnosis present

## 2022-10-10 LAB — COMPREHENSIVE METABOLIC PANEL
ALT: 79 U/L — ABNORMAL HIGH (ref 0–44)
AST: 19 U/L (ref 15–41)
Albumin: 3.4 g/dL — ABNORMAL LOW (ref 3.5–5.0)
Alkaline Phosphatase: 79 U/L (ref 38–126)
Anion gap: 21 — ABNORMAL HIGH (ref 5–15)
BUN: 103 mg/dL — ABNORMAL HIGH (ref 8–23)
CO2: 17 mmol/L — ABNORMAL LOW (ref 22–32)
Calcium: 9.2 mg/dL (ref 8.9–10.3)
Chloride: 95 mmol/L — ABNORMAL LOW (ref 98–111)
Creatinine, Ser: 10.76 mg/dL — ABNORMAL HIGH (ref 0.44–1.00)
GFR, Estimated: 4 mL/min — ABNORMAL LOW (ref >60)
Glucose, Bld: 358 mg/dL — ABNORMAL HIGH (ref 70–99)
Potassium: 6.2 mmol/L — ABNORMAL HIGH (ref 3.5–5.1)
Sodium: 133 mmol/L — ABNORMAL LOW (ref 135–145)
Total Bilirubin: 1.3 mg/dL — ABNORMAL HIGH (ref 0.3–1.2)
Total Protein: 6.1 g/dL — ABNORMAL LOW (ref 6.5–8.1)

## 2022-10-10 LAB — CBC
HCT: 35.2 % — ABNORMAL LOW (ref 36.0–46.0)
Hemoglobin: 11.5 g/dL — ABNORMAL LOW (ref 12.0–15.0)
MCH: 28.2 pg (ref 26.0–34.0)
MCHC: 32.7 g/dL (ref 30.0–36.0)
MCV: 86.3 fL (ref 80.0–100.0)
Platelets: 186 10*3/uL (ref 150–400)
RBC: 4.08 MIL/uL (ref 3.87–5.11)
RDW: 20 % — ABNORMAL HIGH (ref 11.5–15.5)
WBC: 22.2 10*3/uL — ABNORMAL HIGH (ref 4.0–10.5)
nRBC: 0 % (ref 0.0–0.2)

## 2022-10-10 LAB — GLUCOSE, CAPILLARY
Glucose-Capillary: 175 mg/dL — ABNORMAL HIGH (ref 70–99)
Glucose-Capillary: 293 mg/dL — ABNORMAL HIGH (ref 70–99)
Glucose-Capillary: 288 mg/dL — ABNORMAL HIGH (ref 70–99)
Glucose-Capillary: 367 mg/dL — ABNORMAL HIGH (ref 70–99)

## 2022-10-10 LAB — PROCALCITONIN: Procalcitonin: 0.25 ng/mL

## 2022-10-10 MED ORDER — SODIUM CHLORIDE 0.9 % IV SOLN
1.0000 g | Freq: Once | INTRAVENOUS | Status: AC
  Administered 2022-10-10: 1 g via INTRAVENOUS
  Filled 2022-10-10 (×2): qty 10

## 2022-10-10 MED ORDER — PHENOL 1.4 % MT LIQD
1.0000 | OROMUCOSAL | Status: DC | PRN
  Administered 2022-10-11: 1 via OROMUCOSAL
  Filled 2022-10-10: qty 177

## 2022-10-10 MED ORDER — IPRATROPIUM-ALBUTEROL 0.5-2.5 (3) MG/3ML IN SOLN
RESPIRATORY_TRACT | Status: AC
  Administered 2022-10-10: 3 mL
  Filled 2022-10-10: qty 3

## 2022-10-10 MED ORDER — BUDESONIDE 0.5 MG/2ML IN SUSP
0.5000 mg | Freq: Two times a day (BID) | RESPIRATORY_TRACT | Status: DC
  Administered 2022-10-10 – 2022-10-12 (×5): 0.5 mg via RESPIRATORY_TRACT
  Filled 2022-10-10 (×5): qty 2

## 2022-10-10 MED ORDER — METHYLPREDNISOLONE SODIUM SUCC 125 MG IJ SOLR
60.0000 mg | Freq: Two times a day (BID) | INTRAMUSCULAR | Status: DC
  Administered 2022-10-10 – 2022-10-12 (×5): 60 mg via INTRAVENOUS
  Filled 2022-10-10 (×5): qty 2

## 2022-10-10 MED ORDER — HEPARIN SODIUM (PORCINE) 1000 UNIT/ML IJ SOLN
INTRAMUSCULAR | Status: AC
  Filled 2022-10-10: qty 4

## 2022-10-10 MED ORDER — IPRATROPIUM-ALBUTEROL 0.5-2.5 (3) MG/3ML IN SOLN
3.0000 mL | Freq: Three times a day (TID) | RESPIRATORY_TRACT | Status: DC
  Administered 2022-10-10 – 2022-10-12 (×6): 3 mL via RESPIRATORY_TRACT
  Filled 2022-10-10 (×6): qty 3

## 2022-10-10 MED ORDER — VANCOMYCIN HCL IN DEXTROSE 1-5 GM/200ML-% IV SOLN
1000.0000 mg | Freq: Once | INTRAVENOUS | Status: AC
  Administered 2022-10-10: 1000 mg via INTRAVENOUS

## 2022-10-10 MED ORDER — ALTEPLASE 2 MG IJ SOLR: 2.0000 mg | Freq: Once | INTRAMUSCULAR | Status: DC | PRN

## 2022-10-10 MED ORDER — ARFORMOTEROL TARTRATE 15 MCG/2ML IN NEBU
15.0000 ug | INHALATION_SOLUTION | Freq: Two times a day (BID) | RESPIRATORY_TRACT | Status: DC
  Administered 2022-10-10 – 2022-10-12 (×5): 15 ug via RESPIRATORY_TRACT
  Filled 2022-10-10 (×5): qty 2

## 2022-10-10 NOTE — Procedures (Signed)
     HEMODIALYSIS TREATMENT NOTE:   Uneventful 3.5 hour heparin-free treatment completed using RIJ TDC.  Goal met: 3.8 liters removed without interruption in UF.  Hemodynamically stable throughout session.  Vancomycin given during last hour of therapy.  Dressing changed to right upper arm AVG.  There is a pinhole sized area with serosanguinous oozing but no erythema or tenderness.  Cefepime administration was deferred to primary nursing service.  All blood was returned.    Post HD:  temp 98,  144/61,  p77,  r16,  spO2 97% on RA,  weight 78.2kg (pt reports EDW is 79.5kg).  Transported back to room 340 where bedside report was given to Kandy Garrison, Therapist, sports.    Rockwell Alexandria, RN AP K[facility]

## 2022-10-10 NOTE — Hospital Course (Signed)
71 year old female with a history of ESRD (TTS), diastolic CHF, permanent atrial fibrillation, coronary disease, COPD, diabetes mellitus type 2, hypertension, hyperlipidemia presenting with generalized weakness and shortness of breath.  The patient states that she was quite weak even when she left the hospital after recent hospitalization from 09/29/2022 to 10/04/2022.  During that hospitalization, the patient was treated for fluid overload resulting in acute on chronic respiratory failure.  She was initially on BiPAP.  Her hospitalization was also prolonged secondary to COPD exacerbation.  She was discharged home with prednisone.  Her left upper extremity Gore-Tex graft was evaluated by Dr. Donnetta Hutching who felt that it was healing well and should be ready to use in about 2 weeks. Patient stated that since she got home she had progressive weakness and worsening shortness of breath.  She got to the point where she had difficulty getting off the couch.  She fell getting off the couch and hit her head on 10/07/2022.  She did not want to come to the emergency department at that time.  Her last dialysis was on 10/07/2022.  She endorses compliance with fluid restriction.  She has been staying for full sessions of dialysis.  In the last 24 hours prior to admission, the patient endorsed increasing generalized weakness and shortness of breath with associated substernal chest discomfort.  As result, she presented for further evaluation and treatment.  She denied fevers, chills, headache, hemoptysis, nausea, vomiting, diarrhea, abdominal pain. In the ED, the patient was afebrile hemodynamically stable with oxygen saturation 93% on room air.  WBC 23.7, hemoglobin 9.9, platelets 203.  Sodium 137, potassium 4.6, bicarbonate 25, serum creatinine 9.32.  AST 39, ALT 115, alk phosphatase 139, total bilirubin 1.2.  The patient was started on bronchodilators and Solu-Medrol.  Nephrology was consulted to assist with management.

## 2022-10-10 NOTE — Progress Notes (Signed)
PROGRESS NOTE  GOLDIE TREGONING GYF:[id number] DOB: Jul 13, 1951 DOA: 10/09/2022 PCP: [name], MD  Brief History:  71 year old female with a history of ESRD (TTS), diastolic CHF, permanent atrial fibrillation, coronary disease, COPD, diabetes mellitus type 2, hypertension, hyperlipidemia presenting with generalized weakness and shortness of breath.  The patient states that she was quite weak even when she left the hospital after recent hospitalization from 09/29/2022 to 10/04/2022.  During that hospitalization, the patient was treated for fluid overload resulting in acute on chronic respiratory failure.  She was initially on BiPAP.  Her hospitalization was also prolonged secondary to COPD exacerbation.  She was discharged home with prednisone.  Her left upper extremity Gore-Tex graft was evaluated by Dr. Donnetta Hutching who felt that it was healing well and should be ready to use in about 2 weeks. Patient stated that since she got home she had progressive weakness and worsening shortness of breath.  She got to the point where she had difficulty getting off the couch.  She fell getting off the couch and hit her head on 10/07/2022.  She did not want to come to the emergency department at that time.  Her last dialysis was on 10/07/2022.  She endorses compliance with fluid restriction.  She has been staying for full sessions of dialysis.  In the last 24 hours prior to admission, the patient endorsed increasing generalized weakness and shortness of breath with associated substernal chest discomfort.  As result, she presented for further evaluation and treatment.  She denied fevers, chills, headache, hemoptysis, nausea, vomiting, diarrhea, abdominal pain. In the ED, the patient was afebrile hemodynamically stable with oxygen saturation 93% on room air.  WBC 23.7, hemoglobin 9.9, platelets 203.  Sodium 137, potassium 4.6, bicarbonate 25, serum creatinine 9.32.  AST 39, ALT 115, alk phosphatase 139, total bilirubin  1.2.  The patient was started on bronchodilators and Solu-Medrol.  Nephrology was consulted to assist with management.   Assessment/Plan: Acute on chronic respiratory failure with hypoxia -Secondary to COPD exacerbation -The patient is slightly on the hypervolemic side -Personally reviewed chest x-ray--vascular congestion -Stable on 3 L -The patient is normally on nighttime oxygen only at 3 L at home -Wean oxygen as tolerated for saturation greater 90%  COPD exacerbation -Patient has at least 60-pack-year history tobacco, quit 2004 -Continue IV Solu-Medrol -Continue DuoNebs -Start Pulmicort -Start Bryce  ESRD -Nephrology consulted for maintenance dialysis (TTS) -Last dialysis 10/07/2022  Elevated troponin -Secondary to demand ischemia -09/30/2022 echo EF 55 to 60%, IV septal flattening, moderate elevated PASP -Troponin 83>> 99  Fluid overload -Chest x-ray suggest vascular congestion -Fluid removal via dialysis  Acute on chronic diastolic CHF -Primarily right heart failure -09/30/2022 echo EF 55 to 60%, interventricular septum flattening, mild MR  Permanent atrial fibrillation -Personally reviewed EKG--atrial fibrillation, no concerning ST-T wave change -Continue amiodarone -Continue apixaban -Continue diltiazem CD  Controlled diabetes mellitus type 2 -09/30/2022 hemoglobin A1c 6.7 -NovoLog sliding scale -Anticipate elevated CBGs secondary to steroids -Start reduced dose Semglee  Mixed hyperlipidemia -Continue statin  Intermittent hematuria -Patient states that has been present since June 2023 -CT renal protocol -outpatient urology referral if unremarkable         Family Communication: no  Family at bedside  Consultants:  renal  Code Status:  FULL  DVT Prophylaxis:  apixaban   Procedures: As Listed in Progress Note Above  Antibiotics: None     Subjective: Patient states her breathing is little better.  She complains of generalized  weakness.  She denies any fevers, chills, hemoptysis, nausea, vomiting, diarrhea, abdominal pain.  Objective: Vitals:   10/09/22 2100 10/09/22 2245 10/10/22 0108 10/10/22 0538  BP: (!) 144/91 134/75  (!) 159/66  Pulse: 70 69  72  Resp: _0 Temp:  98 F (36.7 C)  98.3 F (36.8 C)  TempSrc:  Oral    SpO2: 98% 98% 92% 100%  Weight:      Height:        Intake/Output Summary (Last 24 hours) at 10/10/2022 2956 Last data filed at 10/10/2022 2130 Gross per 24 hour  Intake --  Output 2 ml  Net -2 ml   Weight change:  Exam:  General:  Pt is alert, follows commands appropriately, not in acute distress HEENT: No icterus, No thrush, No neck mass, Happy/AT Cardiovascular: IRRR, S1/S2, no rubs, no gallops Respiratory: Bibasilar rales.  Bibasilar wheeze wheezing.  Good air movement Abdomen: Soft/+BS, non tender, non distended, no guarding Extremities: No edema, No lymphangitis, No petechiae, No rashes, no synovitis   Data Reviewed: I have personally reviewed following labs and imaging studies Basic Metabolic Panel: Recent Labs  Lab 10/04/22 0459 10/09/22 1254  NA 133* 137  K 4.6 4.6  CL 92* 97*  CO2 26 25  GLUCOSE 179* 271*  BUN 49* 80*  CREATININE 7.04* 9.32*  CALCIUM 9.8 10.0   Liver Function Tests: Recent Labs  Lab 10/09/22 1254  AST 39  ALT 115*  ALKPHOS 139*  BILITOT 1.2  PROT 6.6  ALBUMIN 3.7   No results for input(s): "LIPASE", "AMYLASE" in the last 168 hours. No results for input(s): "AMMONIA" in the last 168 hours. Coagulation Profile: No results for input(s): "INR", "PROTIME" in the last 168 hours. CBC: Recent Labs  Lab 10/04/22 0459 10/09/22 1254  WBC 18.7* 23.7*  NEUTROABS  --  20.5*  HGB 12.9 11.9*  HCT 40.1 36.7  MCV 85.1 86.2  PLT 323 203   Cardiac Enzymes: No results for input(s): "CKTOTAL", "CKMB", "CKMBINDEX", "TROPONINI" in the last 168 hours. BNP: Invalid input(s): "POCBNP" CBG: Recent Labs  Lab 10/03/22 2019 10/04/22 0731  10/04/22 1151 10/09/22 2135 10/10/22 0714  GLUCAP 346* 181* 295* 154* 175*   HbA1C: No results for input(s): "HGBA1C" in the last 72 hours. Urine analysis:    Component Value Date/Time   COLORURINE AMBER (A) 10/09/2022 1104   APPEARANCEUR CLOUDY (A) 10/09/2022 1104   APPEARANCEUR Cloudy (A) 06/28/2022 1054   LABSPEC 1.015 10/09/2022 1104   PHURINE 6.0 10/09/2022 1104   GLUCOSEU >=500 (A) 10/09/2022 1104   HGBUR LARGE (A) 10/09/2022 1104   BILIRUBINUR NEGATIVE 10/09/2022 1104   BILIRUBINUR Negative 06/28/2022 1054   KETONESUR NEGATIVE 10/09/2022 1104   PROTEINUR 100 (A) 10/09/2022 1104   UROBILINOGEN 0.2 01/03/2012 1650   NITRITE NEGATIVE 10/09/2022 1104   LEUKOCYTESUR NEGATIVE 10/09/2022 1104   Sepsis Labs: _1 (procalcitonin:4,lacticidven:4) )No results found for this or any previous visit (from the past 240 hour(s)).   Scheduled Meds:  amiodarone  200 mg Oral BID   apixaban  5 mg Oral BID   aspirin EC  81 mg Oral Daily   atenolol  50 mg Oral QHS   diltiazem  240 mg Oral Daily   Ferrous Fumarate  1 tablet Oral Daily   insulin aspart  0-15 Units Subcutaneous TID WC   insulin aspart  0-5 Units Subcutaneous QHS   insulin glargine-yfgn  25 Units Subcutaneous QHS   ipratropium-albuterol  3  mL Nebulization TID   loratadine  10 mg Oral Daily   methylPREDNISolone (SOLU-MEDROL) injection  80 mg Intravenous Q24H   mometasone-formoterol  2 puff Inhalation BID   multivitamin  1 tablet Oral Daily   rosuvastatin  5 mg Oral Daily   sodium chloride flush  3 mL Intravenous Q12H   Continuous Infusions:  sodium chloride      Procedures/Studies: DG Chest 2 View  Result Date: 10/09/2022 CLINICAL DATA:  Weakness, possible pneumonia. History of COPD and congestive heart failure. Dialysis patient. EXAM: CHEST - 2 VIEW COMPARISON:  10/01/2022 FINDINGS: Right IJ dialysis catheter tip: Right atrium. Lower cervical plate and screw fixator. Atherosclerotic calcification of the aortic  arch. Mild to moderate enlargement of the cardiopericardial silhouette with upper zone pulmonary vascular prominence suggesting pulmonary venous hypertension, but no overt edema. Lower thoracic spondylosis. No pneumonia identified.  No blunting of the costophrenic angles. IMPRESSION: 1. Mild to moderate enlargement of the cardiopericardial silhouette with pulmonary venous hypertension but no overt edema. 2. Right IJ dialysis catheter tip: Right atrium. 3. Lower thoracic spondylosis. Electronically Signed   By: Van Clines M.D.   On: 10/09/2022 14:14   CT Head Wo Contrast  Result Date: 10/09/2022 CLINICAL DATA:  Head trauma EXAM: CT HEAD WITHOUT CONTRAST TECHNIQUE: Contiguous axial images were obtained from the base of the skull through the vertex without intravenous contrast. RADIATION DOSE REDUCTION: This exam was performed according to the departmental dose-optimization program which includes automated exposure control, adjustment of the mA and/or kV according to patient size and/or use of iterative reconstruction technique. COMPARISON:  CT dated June 14, 2022 FINDINGS: Brain: Chronic white matter ischemic change. No evidence of acute infarction, hemorrhage, hydrocephalus, extra-axial collection or mass lesion/mass effect. Vascular: No hyperdense vessel or unexpected calcification. Skull: Normal. Negative for fracture or focal lesion. Sinuses/Orbits: Mucosal thickening of the right sphenoid sinus findings can be seen the setting of chronic sinusitis. No acute abnormality. Other: None. IMPRESSION: No acute intracranial abnormality. Electronically Signed   By: Yetta Glassman M.D.   On: 10/09/2022 13:14   LONG TERM MONITOR (3-14 DAYS)  Result Date: 10/08/2022 Patch Wear Time:  6 days and 21 hours 100% atrial fibrillation burden Less than 1% ventricular ectopy No triggered episodes recorded Will Camnitz, MD   US Venous Img Upper Uni Right(DVT)  Result Date: 10/02/2022 CLINICAL DATA:  Upper  extremity swelling. EXAM: RIGHT UPPER EXTREMITY VENOUS DOPPLER ULTRASOUND TECHNIQUE: Gray-scale sonography with graded compression, as well as color Doppler and duplex ultrasound were performed to evaluate the upper extremity deep venous system from the level of the subclavian vein and including the jugular, axillary, basilic, radial, ulnar and upper cephalic vein. Spectral Doppler was utilized to evaluate flow at rest and with distal augmentation maneuvers. COMPARISON:  Chest XR, 10/02/2019. FINDINGS: VENOUS Dialysis catheter within the imaged R IJV. Otherwise normal compressibility of the RIGHT internal jugular, subclavian, axillary, cephalic, basilic, brachial, radial and ulnar veins. No filling defects to suggest DVT on grayscale or color Doppler imaging. Doppler waveforms show normal direction of venous flow, normal respiratory plasticity and response to augmentation. Limited views of the contralateral subclavian vein are unremarkable. OTHER RIGHT upper extremity arteriovenous fistula with enlargement of the RIGHT brachial vein and thrill within the axillary vein. No evidence of superficial thrombophlebitis or abnormal fluid collection. Limitations: Dressings overlying the RIGHT neck and chest limiting evaluation of the R IJV and venous confluence. IMPRESSION: Suboptimal evaluation, within these constraints; 1. No definitive evidence of DVT or superficial thrombophlebitis  within the RIGHT upper extremity. 2. Ipsilateral (RIGHT) upper extremity fistula and dialysis catheter. Findings likely to represent the etiology of patient's reported upper extremity swelling. Electronically Signed   By: Michaelle Birks M.D.   On: 10/02/2022 10:39   DG CHEST PORT 1 VIEW  Result Date: 10/01/2022 CLINICAL DATA:  Shortness of breath. EXAM: PORTABLE CHEST 1 VIEW COMPARISON:  Chest x-ray 09/29/2022 FINDINGS: Right-sided central venous catheter tip projects over the cavoatrial junction, unchanged. The heart is enlarged,  unchanged. There is central pulmonary vascular congestion. There is no focal lung consolidation, pleural effusion or pneumothorax. No acute fractures are seen. Cervical spinal fusion plate is again noted. IMPRESSION: Cardiomegaly with central pulmonary vascular congestion. Electronically Signed   By: Ronney Asters M.D.   On: 10/01/2022 16:28   ECHOCARDIOGRAM COMPLETE  Result Date: 09/30/2022    ECHOCARDIOGRAM REPORT   Patient Name:   ALAILA PILLARD Date of Exam: 09/30/2022 Medical Rec #:  [id number]       Height:       63.0 in Accession #:    [id number]      Weight:       178.1 lb Date of Birth:  [birth year]       BSA:          1.841 m Patient Age:    39 years        BP:           164/50 mmHg Patient Gender: F               HR:           82 bpm. Exam Location:  [facility] Procedure: 2D Echo, Cardiac Doppler and Color Doppler Indications:    Congestive Heart Failure I50.9  History:        Patient has prior history of Echocardiogram examinations, most                 recent 03/05/2022. CHF, CAD and Previous Myocardial Infarction,                 COPD, Arrythmias:Atrial Fibrillation; Risk Factors:Hypertension,                 Diabetes and Dyslipidemia. ESRD (end stage renal disease) on                 dialysis.  Sonographer:    Alvino Chapel RCS Referring Phys: [id number] ASIA B Bell  1. Left ventricular ejection fraction, by estimation, is 55 to 60%. The left ventricle has normal function. Left ventricular endocardial border not optimally defined to evaluate regional wall motion. There is moderate asymmetric left ventricular hypertrophy of the lateral segment. Left ventricular diastolic parameters are indeterminate. There is the interventricular septum is flattened in diastole ('D' shaped left ventricle), consistent with right ventricular volume overload and the interventricular septum is flattened in systole, consistent with right ventricular pressure overload.  2. Right ventricular systolic  function is mildly reduced. The right ventricular size is normal. There is mildly elevated pulmonary artery systolic pressure. The estimated right ventricular systolic pressure is 53.6 mmHg.  3. Left atrial size was moderately dilated.  4. There is no evidence of cardiac tamponade.  5. The mitral valve is degenerative. Mild mitral valve regurgitation. No evidence of mitral stenosis.  6. The aortic valve is tricuspid. There is mild calcification of the aortic valve. Aortic valve regurgitation is not visualized. Aortic valve sclerosis is present, with no evidence of aortic valve stenosis. Comparison(s):  Slight increase in RVSP from 2022. FINDINGS  Left Ventricle: Left ventricular ejection fraction, by estimation, is 55 to 60%. The left ventricle has normal function. Left ventricular endocardial border not optimally defined to evaluate regional wall motion. The left ventricular internal cavity size was normal in size. There is moderate asymmetric left ventricular hypertrophy of the lateral segment. The interventricular septum is flattened in diastole ('D' shaped left ventricle), consistent with right ventricular volume overload and the interventricular septum is flattened in systole, consistent with right ventricular pressure overload. Left ventricular diastolic parameters are indeterminate. Right Ventricle: The right ventricular size is normal. No increase in right ventricular wall thickness. Right ventricular systolic function is mildly reduced. There is mildly elevated pulmonary artery systolic pressure. The tricuspid regurgitant velocity  is 2.66 m/s, and with an assumed right atrial pressure of 15 mmHg, the estimated right ventricular systolic pressure is 72.5 mmHg. Left Atrium: Left atrial size was moderately dilated. Right Atrium: Right atrial size was normal in size. Pericardium: Trivial pericardial effusion is present. The pericardial effusion is circumferential. There is no evidence of cardiac tamponade.  Mitral Valve: The mitral valve is degenerative in appearance. Mild mitral valve regurgitation. No evidence of mitral valve stenosis. Tricuspid Valve: The tricuspid valve is normal in structure. Tricuspid valve regurgitation is mild . No evidence of tricuspid stenosis. Aortic Valve: The aortic valve is tricuspid. There is mild calcification of the aortic valve. There is mild aortic valve annular calcification. Aortic valve regurgitation is not visualized. Aortic valve sclerosis is present, with no evidence of aortic valve stenosis. Pulmonic Valve: The pulmonic valve was normal in structure. Pulmonic valve regurgitation is not visualized. No evidence of pulmonic stenosis. Aorta: The aortic root is normal in size and structure. IAS/Shunts: No atrial level shunt detected by color flow Doppler.  LEFT VENTRICLE PLAX 2D LVIDd:         4.50 cm LVIDs:         2.90 cm LV PW:         1.50 cm LV IVS:        1.20 cm LVOT diam:     1.50 cm LV SV:         29 LV SV Index:   16 LVOT Area:     1.77 cm  RIGHT VENTRICLE TAPSE (M-mode): 1.3 cm LEFT ATRIUM             Index        RIGHT ATRIUM           Index LA diam:        4.70 cm 2.55 cm/m   RA Area:     20.70 cm LA Vol (A2C):   86.9 ml 47.21 ml/m  RA Volume:   57.70 ml  31.35 ml/m LA Vol (A4C):   68.0 ml 36.94 ml/m LA Biplane Vol: 77.1 ml 41.88 ml/m  AORTIC VALVE LVOT Vmax:   93.63 cm/s LVOT Vmean:  58.233 cm/s LVOT VTI:    0.166 m  AORTA Ao Root diam: 3.00 cm MITRAL VALVE                TRICUSPID VALVE MV Area (PHT): 4.80 cm     TR Peak grad:   28.3 mmHg MV Decel Time: 158 msec     TR Vmax:        266.00 cm/s MV E velocity: 140.00 cm/s MV A velocity: 45.10 cm/s   SHUNTS MV E/A ratio:  3.10  Systemic VTI:  0.17 m                             Systemic Diam: 1.50 cm Rudean Haskell MD Electronically signed by Rudean Haskell MD Signature Date/Time: 09/30/2022/1:37:40 PM    Final    DG Chest 2 View  Result Date: 09/29/2022 CLINICAL DATA:  Chest pain EXAM:  CHEST - 2 VIEW COMPARISON:  Chest x-ray dated August 15, 2022; chest CT dated May 15, 2022 FINDINGS: Unchanged cardiomegaly. Mild diffuse bilateral interstitial opacities. Linear opacity of the right lower lobe, likely due to atelectasis. Indeterminate focal nodular opacities seen in the right lower lobe, new when compared with prior chest x-ray. No large pleural effusion or pneumothorax. Unchanged position of right-sided dialysis catheter. IMPRESSION: 1. Mild diffuse bilateral interstitial opacities, likely due to pulmonary edema. 2. Indeterminate focal nodular opacity seen in the right lower lobe, possibly atelectasis, although given nodules were present on prior chest CT dated May 15, 2022. Recommend further evaluation with repeat chest CT to exclude enlargement. Electronically Signed   By: Yetta Glassman M.D.   On: 09/29/2022 17:53    Orson Eva, DO  [facility]  If 7PM-7AM, please contact night-coverage [email or web address] Password [email or web address] 10/10/2022, 7:21 AM   LOS: 0 days

## 2022-10-10 NOTE — Plan of Care (Signed)
  Problem: Acute Rehab PT Goals(only PT should resolve) Goal: Pt Will Go Supine/Side To Sit Outcome: Progressing Flowsheets (Taken 10/10/2022 1117) Pt will go Supine/Side to Sit: Independently Goal: Patient Will Transfer Sit To/From Stand Outcome: Progressing Flowsheets (Taken 10/10/2022 1117) Patient will transfer sit to/from stand: with min guard assist Goal: Pt Will Transfer Bed To Chair/Chair To Bed Outcome: Progressing Flowsheets (Taken 10/10/2022 1117) Pt will Transfer Bed to Chair/Chair to Bed: min guard assist Goal: Pt Will Ambulate Outcome: Progressing Flowsheets (Taken 10/10/2022 1117) Pt will Ambulate:  with min guard assist  100 feet  with rolling walker   Zigmund Gottron, SPT

## 2022-10-10 NOTE — TOC Initial Note (Signed)
Transition of Care [facility]) - Initial/Assessment Note    Patient Details  Name: [name] MRN: [id number] Date of Birth: Mar 10, 1951  [facility] CM/SW Contact:    Shade Flood, LCSW Phone Number: 10/10/2022, 11:53 AM  Clinical Narrative:                  Pt admitted from home. She was recently discharged from [facility] and declined [facility] referral at the time of previous dc. PT now recommending SNF rehab. Spoke with pt today to assess and review dc planning. Pt agreeable to SNF Rehab. CMS provider options reviewed. Discussed [facility] waiver and possible need to utilize. Pt wanting referral to [place] facilities. Will refer and follow.   Expected Discharge Plan: Skilled Nursing Facility Barriers to Discharge: Continued Medical Work up   Patient Goals and CMS Choice Patient states their goals for this hospitalization and ongoing recovery are:: rehab CMS Medicare.gov Compare Post Acute Care list provided to:: Patient Choice offered to / list presented to : Patient  Expected Discharge Plan and Services Expected Discharge Plan: [place] In-house Referral: Clinical Social Work   Post Acute Care Choice: [place] Living arrangements for the past 2 months: [place]                                      Prior Living Arrangements/Services Living arrangements for the past 2 months: Single Family Home Lives with:: Parents Patient language and need for interpreter reviewed:: Yes Do you feel safe going back to the place where you live?: Yes      Need for Family Participation in Patient Care: Yes (Comment)   Current home services: DME Criminal Activity/Legal Involvement Pertinent to Current Situation/Hospitalization: No - Comment as needed  Activities of Daily Living Home Assistive Devices/Equipment: Bedside commode/3-in-1 ADL Screening (condition at time of admission) Patient's cognitive ability adequate to safely complete daily  activities?: Yes Is the patient deaf or have difficulty hearing?: No Does the patient have difficulty seeing, even when wearing glasses/contacts?: No Does the patient have difficulty concentrating, remembering, or making decisions?: No Patient able to express need for assistance with ADLs?: Yes Does the patient have difficulty dressing or bathing?: No Independently performs ADLs?: Yes (appropriate for developmental age) Does the patient have difficulty walking or climbing stairs?: Yes Weakness of Legs: Both Weakness of Arms/Hands: None  Permission Sought/Granted Permission sought to share information with : Facility Art therapist granted to share information with : Yes, Verbal Permission Granted     Permission granted to share info w AGENCY: sn[facility]        Emotional Assessment   Attitude/Demeanor/Rapport: Engaged Affect (typically observed): Pleasant Orientation: : Oriented to Self, Oriented to Place, Oriented to  Time, Oriented to Situation Alcohol / Substance Use: Not Applicable Psych Involvement: No (comment)  Admission diagnosis:  Hypervolemia, unspecified hypervolemia type [E87.70] Acute hypoxemic respiratory failure ([place]) [J96.01] Patient Active Problem List   Diagnosis Date Noted   Acute hypoxemic respiratory failure ([place]) 10/09/2022   Volume overload 09/30/2022   CHF exacerbation ([place]) 09/29/2022   Secondary hypercoagulable state ([place]) 09/06/2022   Non-ST elevation (NSTEMI) myocardial infarction ([place]) 07/10/2022   Chronic atrial fibrillation ([place]) 07/10/2022   Anxiety    Acute blood loss anemia 06/10/2022   Permanent atrial fibrillation ([place]) 06/10/2022   CAD S/P percutaneous coronary angioplasty 06/10/2022   Heart failure with preserved ejection  fraction ([place]) 06/10/2022   Symptomatic anemia 05/30/2022   GI bleed 05/30/2022   Acute on chronic respiratory failure with hypoxia ([place]) 04/26/2022   Sepsis ([place]) 04/26/2022   Paroxysmal atrial  fibrillation ([place]) 04/26/2022   Multifocal pneumonia 04/25/2022   Dyslipidemia 04/04/2022   Acute exacerbation of Diastolic CHF (congestive heart failure) ([place]) 03/26/2022   Physical deconditioning 03/09/2022   Hyponatremia 03/09/2022   Pulmonary hypertension ([place]) 03/06/2022   Hypokalemia 03/05/2022   Change in bowel habit 12/16/2021   Colon cancer screening 12/16/2021   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   Family history of colonic polyps 12/16/2021   Acute CHF (congestive heart failure) ([place]) 12/06/2021   Leukocytosis 12/06/2021   Elevated brain natriuretic peptide (BNP) level 12/06/2021   GERD (gastroesophageal reflux disease) 12/06/2021   Nausea 08/26/2021   Lumbago of lumbar region with sciatica 07/21/2021   Arthropathy of lumbar facet joint 06/27/2021   Radiculopathy, lumbar region 05/05/2021   Chest pain 02/19/2021   Essential hypertension    Mixed hyperlipidemia    Hypoalbuminemia    Bruit 01/05/2020   Educated about COVID-19 virus infection 01/05/2020   Lung nodule 05/01/2018   Prolonged Q-T interval on ECG 09/06/2017   Unstable angina (HCC) 09/06/2017   GAD (generalized anxiety disorder) 03/04/2016   Leg pain 01/07/2016   Anemia of chronic disease/ESRD 12/10/2015   Dependence on renal dialysis ([place]) 12/10/2015   ESRD (end stage renal disease) on dialysis ([place]) 12/10/2015   Osteopathy in diseases classified elsewhere, unspecified site 12/10/2015   Constipation 10/25/2015   COPD with acute exacerbation ([place]) 03/06/2015   Pericardial effusion 03/06/2015   Chronic diastolic CHF (congestive heart failure) ([place]) 03/05/2015   PAD (peripheral artery disease) ([place]) 04/21/2014   Dyspnea 09/29/2013   Obesity (BMI 30.0-34.9) 12/05/2011   Diverticulosis 03/29/2011   Diabetic neuropathy ([place]) 03/29/2011   Esophageal stricture 03/29/2011   Vitamin D deficiency 03/29/2011   Degenerative joint disease of left shoulder 03/29/2011   Neck pain 03/29/2011   Type 2 diabetes  mellitus with hyperlipidemia ([place]) 04/19/2009   Hyperlipidemia associated with type 2 diabetes mellitus ([place]) 04/19/2009   Coronary artery disease of native artery of native heart with stable angina pectoris ([place]) 04/19/2009   PCP:  [name], MD Pharmacy:   [facility] [address], [place] [address] [place] [address] Phone: [phone] Fax: [phone]     Social Determinants of Health (SDOH) Interventions    Readmission Risk Interventions    10/02/2022   11:12 AM 05/31/2022    8:33 AM 04/28/2022    2:04 PM  Readmission Risk Prevention Plan  Transportation Screening Complete Complete Complete  Medication Review Press photographer) Complete Complete Complete  PCP or Specialist appointment within 3-5 days of discharge Not Complete  Complete  HRI or [facility] Complete Complete Complete  SW Recovery Care/Counseling Consult Complete    Palliative Care Screening Not Applicable Not [place] Not Applicable Not Applicable

## 2022-10-10 NOTE — Consult Note (Signed)
[name] Admit Date: 10/09/2022 10/10/2022 [name] Requesting Physician:  Tat MD  Reason for Consult:  ESRD Comanagement HPI:  [id number] presented to [facility] ED 10/09/22 after recent admission for AeCOPD/AoC HFpEF with DC 11/1 with weakness and fatigue.  In ED noted hypoxic req supplemental O2.  Admitted and resumed treatment for AeCOPD given wheezing, findings on CXR.  Also felt to have ongoing HFpEF exacerbation with elevated BNP.    In ED K 4.0, SCr 9.3.  WBC 23 and Hb 11.9.   Went to HD 11/4 and repots it was uneventful.  Usual UF around 3L per her report.  [place] R IJ working well, has RUE AVG nearing maturity but not in use yet.    This AM still feels weak and with subjective dyspnea. On IV Solumedrol.    Outpt HD Orders from Last Admit: [facility]  TTS , 3:45,  EDW 80 kg, 1 K /2.5 calc,  heparin - yes, calcitriol 1.75 TIW, mircera 100 q 2 weeks, venofer 50 weekly-  using TDC 350 BFR/500 DFR   PMH Incudes: DM2, COPD, HFpEF, perimanent AFib ROS Balance of 12 systems is negative w/ exceptions as above  PMH  Past Medical History:  Diagnosis Date   Acute on chronic diastolic CHF (congestive heart failure) ([place]) 03/05/2015   Grade 2. EF 60-65%   Anemia    hx of   Anxiety    Arthritis    CAD (coronary artery disease)    s/p Stenting in 2005;  [place] 7/09: Vigorous LV function, 2-3+ MR, RI 40%, RI stent patent, proximal-mid RCA 40-50%;  Echo 7/09:  EF 55-65%, trivial MR;  [place] 11/18/12: EF 66%, normal LV wall motion, mild to moderate anterior ischemia - reviewed by [facility] and felt to rep breast atten (low risk);  Echo 6/14: mild LVH, EF 55-65%, Gr 2 DD, PASP 44, trivial eff    Chronic kidney disease    CREATININE IS UP--GOING TO KIDNEY MD    Chronic neck pain    COPD (chronic obstructive pulmonary disease) (HCC)    Degenerative joint disease    left shoulder   Diabetic neuropathy (HCC)    Diverticulosis    Esophageal stricture    GERD (gastroesophageal reflux disease)     Hyperlipidemia    Hypertension    IDDM (insulin dependent diabetes mellitus)    Pericardial effusion 03/06/2015   Very small per echo ; pleural nodules seen on CT chest.   Peripheral vascular disease ([place])    Stroke ([place])    "mini- years ago"   Vitamin D deficiency    PSH  Past Surgical History:  Procedure Laterality Date   A/V FISTULAGRAM N/A 08/10/2017   Procedure: A/V Fistulagram - Right Arm;  Surgeon: Elam Dutch, MD;  Location: [place] CV LAB;  Service: Cardiovascular;  Laterality: N/A;   A/V FISTULAGRAM N/A 03/03/2020   Procedure: A/V FISTULAGRAM - Right Arm;  Surgeon: Marty Heck, MD;  Location: [place] CV LAB;  Service: Cardiovascular;  Laterality: N/A;   ABDOMINAL HYSTERECTOMY     AV FISTULA PLACEMENT Right 07/20/2015   Procedure: RADIAL CEPHALIC ARTERIOVENOUS FISTULA CREATION RIGHT ARM;  Surgeon: Angelia Mould, MD;  Location: [place];  Service: Vascular;  Laterality: Right;   AV FISTULA PLACEMENT Right 11/26/2015   Procedure:  Creation of RIGHT BRACHIO-CEPHALIC arteriovenous fistula;  Surgeon: Angelia Mould, MD;  Location: [place];  Service: Vascular;  Laterality: Right;   AV FISTULA PLACEMENT Right 08/15/2022  Procedure: INSERTION OF RIGHT UPPER ARM ARTERIOVENOUS (AV) GORE-TEX GRAFT;  Surgeon: Rosetta Posner, MD;  Location: [facility];  Service: Vascular;  Laterality: Right;   BACK SURGERY     2007 & 2013   [place]  2003, 2005, 2009   1 stent   cardiac stents     CARDIOVERSION N/A 04/24/2022   Procedure: CARDIOVERSION;  Surgeon: Fay Records, MD;  Location: [facility] ENDOSCOPY;  Service: Cardiovascular;  Laterality: N/A;   CATARACT EXTRACTION W/PHACO Right 05/04/2014   Procedure: CATARACT EXTRACTION PHACO AND INTRAOCULAR LENS PLACEMENT ([place]);  Surgeon: Williams Che, MD;  Location: [facility];  Service: Ophthalmology;  Laterality: Right;  CDE 1.47   CATARACT EXTRACTION W/PHACO Left 07/20/2014   Procedure: CATARACT  EXTRACTION WITH PHACO AND INTRAOCULAR LENS PLACEMENT LEFT EYE CDE=1.79;  Surgeon: Williams Che, MD;  Location: [facility];  Service: Ophthalmology;  Laterality: Left;   CERVICAL SPINE SURGERY  2012   8 screws   CORONARY ATHERECTOMY N/A 03/08/2022   Procedure: CORONARY ATHERECTOMY;  Surgeon: Sherren Mocha, MD;  Location: [place] CV LAB;  Service: Cardiovascular;  Laterality: N/A;   CYSTOSCOPY WITH INJECTION N/A 06/03/2013   Procedure: MACROPLASTIQUE  INJECTION ;  Surgeon: Malka So, MD;  Location: [facility];  Service: Urology;  Laterality: N/A;   ESOPHAGEAL DILATION Right 06/02/2022   Procedure: ESOPHAGEAL DILATION;  Surgeon: Harvel Quale, MD;  [facility];  Service: Gastroenterology;  Laterality: Right;   ESOPHAGOGASTRODUODENOSCOPY (EGD) WITH PROPOFOL N/A 06/02/2022   Procedure: ESOPHAGOGASTRODUODENOSCOPY (EGD) WITH PROPOFOL;  Surgeon: Harvel Quale, MD;  [facility];  Service: Gastroenterology;  Laterality: N/A;  TO BE DONE IN OR 2   FISTULOGRAM Right 10/20/2016   Procedure: FISTULOGRAM WITH POSSIBLE INTERVENTION;  Surgeon: [name], MD;  Location: [facility];  Service: Vascular;  Laterality: Right;   GIVENS CAPSULE STUDY N/A 06/11/2022   Procedure: GIVENS CAPSULE STUDY;  Surgeon: Clarene Essex, MD;  Location: [place];  Service: Gastroenterology;  Laterality: N/A;   HEMOSTASIS CLIP PLACEMENT  06/02/2022   Procedure: HEMOSTASIS CLIP PLACEMENT;  Surgeon: Harvel Quale, MD;  [facility];  Service: Gastroenterology;;   HOT HEMOSTASIS  06/02/2022   Procedure: HOT HEMOSTASIS (ARGON PLASMA COAGULATION/BICAP);  Surgeon: Montez Morita, Quillian Quince, MD;  [facility];  Service: Gastroenterology;;   INSERTION OF DIALYSIS CATHETER N/A 11/26/2015   Procedure: INSERTION OF DIALYSIS CATHETER;  Surgeon: Angelia Mould, MD;  Location: [place];  Service: Vascular;  Laterality: N/A;   INSERTION OF DIALYSIS CATHETER Right  08/15/2022   Procedure: INSERTION OF TUNNELED DIALYSIS CATHETER;  Surgeon: Rosetta Posner, MD;  Location: [facility];  Service: Vascular;  Laterality: Right;   INTRAVASCULAR IMAGING/OCT N/A 03/08/2022   Procedure: INTRAVASCULAR IMAGING/OCT;  Surgeon: Sherren Mocha, MD;  Location: [place] CV [facility];  Service: Cardiovascular;  Laterality: N/A;   LAMINECTOMY  12/29/2011   LEFT HEART CATH AND CORONARY ANGIOGRAPHY N/A 03/06/2022   Procedure: LEFT HEART CATH AND CORONARY ANGIOGRAPHY;  Surgeon: Nelva Bush, MD;  Location: [place] [facility];  Service: Cardiovascular;  Laterality: N/A;   LUMBAR LAMINECTOMY/DECOMPRESSION MICRODISCECTOMY  12/29/2011   Procedure: LUMBAR LAMINECTOMY/DECOMPRESSION MICRODISCECTOMY;  Surgeon: Floyce Stakes, MD;  Location: [place] NEURO ORS;  Service: Neurosurgery;  Laterality: N/A;  Lumbar Two through Lumbar Five Laminectomies/Cellsaver   PARTIAL HYSTERECTOMY     PERIPHERAL VASCULAR BALLOON ANGIOPLASTY  03/03/2020   Procedure: PERIPHERAL VASCULAR  BALLOON ANGIOPLASTY;  Surgeon: Marty Heck, MD;  Location: [place] CV [facility];  Service: Cardiovascular;;  Rt arm fistula   PERIPHERAL VASCULAR BALLOON ANGIOPLASTY Right 04/15/2021   Procedure: PERIPHERAL VASCULAR BALLOON ANGIOPLASTY;  Surgeon: Angelia Mould, MD;  Location: [place] [facility];  Service: Cardiovascular;  Laterality: Right;  arm fistula   PERIPHERAL VASCULAR BALLOON ANGIOPLASTY  03/14/2022   Procedure: PERIPHERAL VASCULAR BALLOON ANGIOPLASTY;  Surgeon: Broadus John, MD;  Location: [place] CV LAB;  Service: Cardiovascular;;  right arm AVF   PERIPHERAL VASCULAR CATHETERIZATION N/A 11/22/2015   Procedure: Fistulagram;  Surgeon: Angelia Mould, MD;  Location: [place] [facility];  Service: Cardiovascular;  Laterality: N/A;   PERIPHERAL VASCULAR INTERVENTION  08/10/2017   Procedure: PERIPHERAL VASCULAR INTERVENTION;  Surgeon: Elam Dutch, MD;  Location: [place] [facility];  Service: Cardiovascular;;    SHOULDER SURGERY Right    Rotator cuff   FH  Family History  Problem Relation Age of Onset   Heart disease Other    Hypertension Father    Cancer Father    Hypertension Mother    Hypertension Daughter    Pancreatitis Brother    Hypertension Daughter    Diabetes Daughter    SH  reports that she quit smoking about 20 years ago. Her smoking use included cigarettes. She has a 30.00 pack-year smoking history. She has never been exposed to tobacco smoke. She has never used smokeless tobacco. She reports that she does not drink alcohol and does not use drugs. Allergies  Allergies  Allergen Reactions   Azithromycin     Prolonged QT   Ace Inhibitors Cough   Clopidogrel Bisulfate Other (See Comments)    Sick, Headache, "Felt terrible"   Codeine Other (See Comments)    hallucinations   Lisinopril Cough   Penicillins Other (See Comments)    Unknown childhood reaction Did it involve swelling of the face/tongue/throat, SOB, or low BP? Unknown Did it involve sudden or severe rash/hives, skin peeling, or any reaction on the inside of your mouth or nose? Unknown Did you need to seek medical attention at a hospital or doctor's office? Unknown When did it last happen?      childhood allergy If all above answers are "NO", may proceed with cephalosporin use.     Home medications Prior to Admission medications   Medication Sig Start Date End Date Taking? Authorizing Provider  albuterol (PROVENTIL) (2.5 MG/3ML) 0.083% nebulizer solution Take 3 mLs (2.5 mg total) by nebulization every 6 (six) hours as needed for wheezing or shortness of breath. 12/02/21  Yes [name], MD  amiodarone (PACERONE) 200 MG tablet Take 1 tablet by mouth twice a day for 2 weeks then reduce to 1 tablet daily Patient taking differently: Take 200 mg by mouth 2 (two) times daily. 09/06/22  Yes Fenton, Clint R, PA  apixaban (ELIQUIS) 5 MG TABS tablet Take 1 tablet (5 mg total) by mouth 2 (two) times daily. 04/05/22  Yes  Minus Breeding, MD  aspirin EC 81 MG tablet Take 1 tablet (81 mg total) by mouth daily. Swallow whole. 06/15/22  Yes Vashti Hey, MD  atenolol (TENORMIN) 100 MG tablet Take 0.5 tablets (50 mg total) by mouth at bedtime. 08/02/22  Yes Sherran Needs, NP  budesonide-formoterol [facility]) 160-4.5 MCG/ACT inhaler Inhale 2 puffs into the lungs 2 (two) times daily. Patient taking differently: Inhale 2 puffs into the lungs 2 (two) times daily as needed (shortness of breath). 05/03/22  Yes [name], MD  cetirizine (ZYRTEC) 10 MG tablet Take 1 tablet (10 mg total) by mouth daily as needed for allergies. Patient taking differently: Take 10 mg by mouth daily. 06/15/22  Yes Vashti Hey, MD  diltiazem (CARDIZEM CD) 240 MG 24 hr capsule Take 1 capsule (240 mg total) by mouth daily. 05/17/22  Yes Minus Breeding, MD  DULoxetine (CYMBALTA) 30 MG capsule TAKE 1 CAPSULE BY MOUTH ONCE DAILY WITH SUPPER Patient taking differently: Take 30 mg by mouth every evening. 09/04/22  Yes [name], MD  Ferrous Fumarate (HEMOCYTE - 106 MG FE) 324 (106 Fe) MG TABS tablet Take 1 tablet (106 mg of iron total) by mouth daily. 06/28/22  Yes [name], [name], MD  gabapentin (NEURONTIN) 400 MG capsule TAKE 2 CAPSULES AFTER EACH DIALYSIS SESSION Patient taking differently: Take 800 mg by mouth See admin instructions. Take 800 mg by mouth Tuesday, Thursday and Saturday after dialysis 07/27/22  Yes [name], [name], MD  multivitamin (RENA-VIT) TABS tablet Take 1 tablet by mouth daily. 05/13/22  Yes [provider]  predniSONE (DELTASONE) 20 MG tablet Take 2 tablets (40 mg total) by mouth daily with breakfast for 5 days. 10/05/22 10/10/22 Yes Shah, Pratik D, DO  rOPINIRole (REQUIP) 1 MG tablet Take 1 mg by mouth at bedtime.   Yes [provider]  Semaglutide,0.25 or 0.5MG /DOS, (OZEMPIC, 0.25 OR 0.5 MG/DOSE,) 2 MG/1.5ML SOPN Inject 0.5 mg into the skin every 7 (seven) days. Saturday   Yes  [provider]  torsemide (DEMADEX) 20 MG tablet Take 20 mg by mouth daily. 07/02/22  Yes [provider]  acetaminophen (TYLENOL) 500 MG tablet Take 1,000 mg by mouth every 6 (six) hours as needed for moderate pain or headache.    [provider]  Carboxymethylcellul-Glycerin (LUBRICATING EYE DROPS OP) Place 1 drop into both eyes daily as needed (dry eyes).    [provider]  Continuous Blood Gluc Receiver (FREESTYLE LIBRE 2 READER) DEVI USE TO TEST BLOOD SUGAR CONTINUOUSLY 11/15/21   [name], MD  Continuous Blood Gluc Sensor (FREESTYLE LIBRE 2 SENSOR) MISC Use multiple times daily to track glucose to prevent highs and lows as a complication of dialysis Dx:Z99.2, E11.59 09/15/21   [name], MD  glucose blood (ONETOUCH ULTRA) test strip Use to test blood sugar 3 times daily as directed. DX: E11.9 09/13/20   [name], MD  insulin aspart (NOVOLOG FLEXPEN) 100 UNIT/ML FlexPen Inject 1 Units into the skin 3 (three) times daily with meals. < 150: 0 units. 150-199:1 unit. 200-249: 2 units. 250-299: 3 units. 300-349: 4 units. Over 350: 5 units 05/19/22   Rakes, Connye Burkitt, FNP  insulin degludec (TRESIBA FLEXTOUCH) 100 UNIT/ML FlexTouch Pen Inject 20 Units into the skin at bedtime. Via novo nordisk patient assistance program Patient taking differently: Inject 25 Units into the skin at bedtime. Via novo nordisk patient assistance program 05/03/22   [name], MD  lanthanum (FOSRENOL) 750 MG chewable tablet SMARTSIG:2 Tablet(s) By Mouth Patient not taking: Reported on 09/29/2022 09/21/22   [provider]  nitroGLYCERIN (NITROSTAT) 0.4 MG SL tablet Place 1 tablet (0.4 mg total) under the tongue every 5 (five) minutes as needed for chest pain. 11/09/21   Minus Breeding, MD  ondansetron (ZOFRAN) 4 MG tablet TAKE 1 TABLET BY MOUTH EVERY 8 HOURS AS NEEDED FOR NAUSEA AND VOMITING Patient taking differently: Take 4 mg by mouth every 8 (eight) hours as  needed for nausea or vomiting. TAKE 1 TABLET BY MOUTH  EVERY 8 HOURS AS NEEDED FOR NAUSEA AND VOMITING 07/03/22   [name], MD  polyethylene glycol (MIRALAX / GLYCOLAX) 17 g packet Take 17 g by mouth daily as needed for moderate constipation or mild constipation. 03/10/22   Pokhrel, Corrie Mckusick, MD  rosuvastatin (CRESTOR) 5 MG tablet Take 1 tablet (5 mg total) by mouth daily. For cholesterol Patient not taking: Reported on 09/29/2022 12/12/21   [name], MD    Current Medications Scheduled Meds:  amiodarone  200 mg Oral BID   apixaban  5 mg Oral BID   arformoterol  15 mcg Nebulization BID   aspirin EC  81 mg Oral Daily   atenolol  50 mg Oral QHS   budesonide (PULMICORT) nebulizer solution  0.5 mg Nebulization BID   diltiazem  240 mg Oral Daily   Ferrous Fumarate  1 tablet Oral Daily   insulin aspart  0-15 Units Subcutaneous TID WC   insulin aspart  0-5 Units Subcutaneous QHS   insulin glargine-yfgn  25 Units Subcutaneous QHS   ipratropium-albuterol  3 mL Nebulization TID   loratadine  10 mg Oral Daily   methylPREDNISolone (SOLU-MEDROL) injection  60 mg Intravenous Q12H   multivitamin  1 tablet Oral Daily   rosuvastatin  5 mg Oral Daily   sodium chloride flush  3 mL Intravenous Q12H   Continuous Infusions:  sodium chloride     PRN Meds:.sodium chloride, acetaminophen **OR** acetaminophen, nitroGLYCERIN, polyethylene glycol, sodium chloride flush  CBC Recent Labs  Lab 10/04/22 0459 10/09/22 1254  WBC 18.7* 23.7*  NEUTROABS  --  20.5*  HGB 12.9 11.9*  HCT 40.1 36.7  MCV 85.1 86.2  PLT 323 644   Basic Metabolic Panel Recent Labs  Lab 10/04/22 0459 10/09/22 1254  NA 133* 137  K 4.6 4.6  CL 92* 97*  CO2 26 25  GLUCOSE 179* 271*  BUN 49* 80*  CREATININE 7.04* 9.32*  CALCIUM 9.8 10.0    Physical Exam  Blood pressure (!) 159/66, pulse 72, temperature 98.3 F (36.8 C), resp. rate 19, height 5\' 3"  (1.6 m), weight 79.4 kg, SpO2 98 %. GEN: NAD lying in bed, speaks in  full sentences, chronically ill appearing ENT: NCAT EYES: EOMI CV: RRR nl s1s2 PULM: Diminished in the bases, nl WOB ABD: s/nt/nd VASCULAR: RUE AVG +B/T SKIN: R IJ TDC C/D/I EXT: Trace LEE at most  Assessment 39F readmit after AeCOPD/AoC HFpEF exacerbation with weakness and AHRF.  ESRD THS R IJ TDC, [facility]: HD today, 2K, 3-4L UF, no heparin Weakness: probably multifactorial, treat #2 and #3, PT  AeCOPD per TRH on IV steroids AoC HFpEF: UF as above to help ANemia: Hb stable CKD-BMD: cont outpt mgmt RUE AVG: recently treated for infection, looks good today, cannulation in near future DM2 AFib  Plan As above We will follow along   [name]  10/10/2022, 8:38 AM

## 2022-10-10 NOTE — [place] FL2 (Signed)
[place] MEDICAID FL2 LEVEL OF CARE SCREENING TOOL     IDENTIFICATION  Patient Name: [name] Birthdate: [birth year] Sex: female Admission Date (Current Location): 10/09/2022  [facility] and [place] Number:  [employer] and Address:  [facility] [address], [place]      Provider Number: [id number]  Attending Physician Name and Address:  Orson Eva, MD  Relative Name and Phone Number:       Current Level of Care: Hospital Recommended Level of Care: [place] Prior Approval Number:    Date Approved/Denied:   PASRR Number:    Discharge Plan: SNF    Current Diagnoses: Patient Active Problem List   Diagnosis Date Noted   Acute hypoxemic respiratory failure ([place]) 10/09/2022   Volume overload 09/30/2022   CHF exacerbation ([place]) 09/29/2022   Secondary hypercoagulable state ([place]) 09/06/2022   Non-ST elevation (NSTEMI) myocardial infarction ([place]) 07/10/2022   Chronic atrial fibrillation ([place]) 07/10/2022   Anxiety    Acute blood loss anemia 06/10/2022   Permanent atrial fibrillation ([place]) 06/10/2022   CAD S/P percutaneous coronary angioplasty 06/10/2022   Heart failure with preserved ejection fraction ([place]) 06/10/2022   Symptomatic anemia 05/30/2022   GI bleed 05/30/2022   Acute on chronic respiratory failure with hypoxia ([place]) 04/26/2022   Sepsis ([place]) 04/26/2022   Paroxysmal atrial fibrillation ([place]) 04/26/2022   Multifocal pneumonia 04/25/2022   Dyslipidemia 04/04/2022   Acute exacerbation of Diastolic CHF (congestive heart failure) ([place]) 03/26/2022   Physical deconditioning 03/09/2022   Hyponatremia 03/09/2022   Pulmonary hypertension ([place]) 03/06/2022   Hypokalemia 03/05/2022   Change in bowel habit 12/16/2021   Colon cancer screening 12/16/2021   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   Family history of colonic polyps 12/16/2021   Acute CHF (congestive heart failure) ([place]) 12/06/2021   Leukocytosis  12/06/2021   Elevated brain natriuretic peptide (BNP) level 12/06/2021   GERD (gastroesophageal reflux disease) 12/06/2021   Nausea 08/26/2021   Lumbago of lumbar region with sciatica 07/21/2021   Arthropathy of lumbar facet joint 06/27/2021   Radiculopathy, lumbar region 05/05/2021   Chest pain 02/19/2021   Essential hypertension    Mixed hyperlipidemia    Hypoalbuminemia    Bruit 01/05/2020   Educated about COVID-19 virus infection 01/05/2020   Lung nodule 05/01/2018   Prolonged Q-T interval on ECG 09/06/2017   Unstable angina ([place]) 09/06/2017   GAD (generalized anxiety disorder) 03/04/2016   Leg pain 01/07/2016   Anemia of chronic disease/ESRD 12/10/2015   Dependence on renal dialysis ([place]) 12/10/2015   ESRD (end stage renal disease) on dialysis ([place]) 12/10/2015   Osteopathy in diseases classified elsewhere, unspecified site 12/10/2015   Constipation 10/25/2015   COPD with acute exacerbation ([place]) 03/06/2015   Pericardial effusion 03/06/2015   Chronic diastolic CHF (congestive heart failure) ([place]) 03/05/2015   PAD (peripheral artery disease) ([place]) 04/21/2014   Dyspnea 09/29/2013   Obesity (BMI 30.0-34.9) 12/05/2011   Diverticulosis 03/29/2011   Diabetic neuropathy ([place]) 03/29/2011   Esophageal stricture 03/29/2011   Vitamin D deficiency 03/29/2011   Degenerative joint disease of left shoulder 03/29/2011   Neck pain 03/29/2011   Type 2 diabetes mellitus with hyperlipidemia ([place]) 04/19/2009   Hyperlipidemia associated with type 2 diabetes mellitus ([place]) 04/19/2009   Coronary artery disease of native artery of native heart with stable angina pectoris ([place]) 04/19/2009    Orientation RESPIRATION BLADDER Height & Weight     Self, Time, Situation, Place  Normal Continent Weight: 175  lb 1.6 oz (79.4 kg) Height:  5\' 3"  (160 cm)  BEHAVIORAL SYMPTOMS/MOOD NEUROLOGICAL BOWEL NUTRITION STATUS      Continent Diet (see dc summary)  AMBULATORY STATUS COMMUNICATION OF NEEDS Skin    Extensive Assist Verbally Normal                       Personal Care Assistance Level of Assistance  Bathing, Feeding, Dressing Bathing Assistance: Limited assistance Feeding assistance: Independent Dressing Assistance: Limited assistance     Functional Limitations Info  Sight, Hearing, Speech Sight Info: Adequate Hearing Info: Adequate Speech Info: Adequate    SPECIAL CARE FACTORS FREQUENCY  PT (By licensed PT), OT (By licensed OT)     PT Frequency: 5x week OT Frequency: 3x week            Contractures Contractures Info: Not present    Additional Factors Info  Code Status, Allergies Code Status Info: Full Allergies Info: Azithromycin, Ace Inhibitors, Clopidogrel Bisulfate, Codeine, Lisinopril, Penicillins           Current Medications (10/10/2022):  This is the current hospital active medication list Current Facility-Administered Medications  Medication Dose Route Frequency Provider Last Rate Last Admin   0.9 %  sodium chloride infusion  250 mL Intravenous PRN Oswald Hillock, MD       acetaminophen (TYLENOL) tablet 650 mg  650 mg Oral Q6H PRN Oswald Hillock, MD   650 mg at 10/09/22 2329   Or   acetaminophen (TYLENOL) suppository 650 mg  650 mg Rectal Q6H PRN Oswald Hillock, MD       amiodarone (PACERONE) tablet 200 mg  200 mg Oral BID Oswald Hillock, MD   200 mg at 10/10/22 0844   apixaban (ELIQUIS) tablet 5 mg  5 mg Oral BID Oswald Hillock, MD   5 mg at 10/10/22 0847   arformoterol (BROVANA) nebulizer solution 15 mcg  15 mcg Nebulization BID Orson Eva, MD   15 mcg at 10/10/22 0809   aspirin EC tablet 81 mg  81 mg Oral Daily Oswald Hillock, MD   81 mg at 10/10/22 0844   atenolol (TENORMIN) tablet 50 mg  50 mg Oral QHS Oswald Hillock, MD   50 mg at 10/09/22 2229   budesonide (PULMICORT) nebulizer solution 0.5 mg  0.5 mg Nebulization BID Tat, Shanon Brow, MD   0.5 mg at 10/10/22 0809   diltiazem (CARDIZEM CD) 24 hr capsule 240 mg  240 mg Oral Daily Oswald Hillock, MD   240  mg at 10/10/22 9628   Ferrous Fumarate (HEMOCYTE - 106 mg FE) tablet 106 mg of iron  1 tablet Oral Daily Oswald Hillock, MD   106 mg of iron at 10/10/22 1004   insulin aspart (novoLOG) injection 0-15 Units  0-15 Units Subcutaneous TID WC Oswald Hillock, MD   3 Units at 10/10/22 0844   insulin aspart (novoLOG) injection 0-5 Units  0-5 Units Subcutaneous QHS Oswald Hillock, MD       insulin glargine-yfgn (SEMGLEE) injection 25 Units  25 Units Subcutaneous QHS Oswald Hillock, MD   25 Units at 10/09/22 2328   ipratropium-albuterol (DUONEB) 0.5-2.5 (3) MG/3ML nebulizer solution 3 mL  3 mL Nebulization TID Oswald Hillock, MD   3 mL at 10/10/22 0809   loratadine (CLARITIN) tablet 10 mg  10 mg Oral Daily Oswald Hillock, MD   10 mg at 10/10/22 0844   methylPREDNISolone sodium succinate (SOLU-MEDROL)  125 mg/2 mL injection 60 mg  60 mg Intravenous Therisa Doyne, MD   60 mg at 10/10/22 0843   multivitamin (RENA-VIT) tablet 1 tablet  1 tablet Oral Daily Oswald Hillock, MD   1 tablet at 10/10/22 0844   nitroGLYCERIN (NITROSTAT) SL tablet 0.4 mg  0.4 mg Sublingual Q5 min PRN Oswald Hillock, MD       polyethylene glycol (MIRALAX / GLYCOLAX) packet 17 g  17 g Oral Daily PRN Oswald Hillock, MD       rosuvastatin (CRESTOR) tablet 5 mg  5 mg Oral Daily Oswald Hillock, MD   5 mg at 10/10/22 0843   sodium chloride flush (NS) 0.9 % injection 3 mL  3 mL Intravenous Q12H Oswald Hillock, MD   3 mL at 10/10/22 1005   sodium chloride flush (NS) 0.9 % injection 3 mL  3 mL Intravenous PRN Oswald Hillock, MD         Discharge Medications: Please see discharge summary for a list of discharge medications.  Relevant Imaging Results:  Relevant Lab Results:   Additional Information SSN: [id number] LCSW

## 2022-10-10 NOTE — Progress Notes (Signed)
Transition of Care (TOC) -30 day Note       Patient Details  Name: [name] MRN: [id number] Date of Birth: 07-19-1951   Transition of Care [facility]) CM/SW Contact  Name: Shade Flood Phone Number: [phone] Date: 10/10/2022 Time: 1158    To Whom it May Concern:   Please be advised that the above patient will require a short-term nursing home stay, anticipated 30 days or less rehabilitation and strengthening. The plan is for return home.

## 2022-10-10 NOTE — Evaluation (Signed)
Physical Therapy Evaluation Patient Details Name: [name] MRN: [id number] DOB: 1951-05-27 Today's Date: 10/10/2022  History of Present Illness  [name]  is a 71 y.o. female, with past medical history of ESRD on hemodialysis Tuesday Thursday and Saturday, diastolic CHF, atrial fibrillation, recent hospitalization was discharged on 10/04/2022 presented to the ED with generalized fatigue and shortness of breath.  Patient said that she was well after discharge but in the last 24 hours she was feeling extremely weak, on Saturday she fell.  Denies passing out but admits to hitting her head.  Complains of chest tightness.  No nausea vomiting or diarrhea.  Patient is on iron supplementation and complains of black stool.  She is also on Eliquis for atrial fibrillation.   Clinical Impression  Patient demonstrates slightly labored movement for sitting up at bedside, requires min/mod assist for standing due to generalized weakness. Patient slightly unsteady on feet, able to ambulate in hallway with RW/min assist requiring one short standing rest break. Patient limited in ambulation mostly due to fatigue demonstrating increased trunk flexion and c/o fatigue with increased distance. Patient tolerated sitting up in chair after therapy. Patient will benefit from continued skilled physical therapy in hospital and recommended venue below to increase strength, balance, endurance for safe ADLs and gait.     Recommendations for follow up therapy are one component of a multi-disciplinary discharge planning process, led by the attending physician.  Recommendations may be updated based on patient status, additional functional criteria and insurance authorization.  Follow Up Recommendations Skilled nursing-short term rehab (<3 hours/day) Can patient physically be transported by private vehicle: Yes    Assistance Recommended at Discharge Set up Supervision/Assistance  Patient can return home with the  following  A little help with walking and/or transfers;A little help with bathing/dressing/bathroom;Assistance with cooking/housework;Help with stairs or ramp for entrance    Equipment Recommendations None recommended by PT  Recommendations for Other Services       Functional Status Assessment Patient has had a recent decline in their functional status and demonstrates the ability to make significant improvements in function in a reasonable and predictable amount of time.     Precautions / Restrictions Precautions Precautions: Fall Restrictions Weight Bearing Restrictions: No      Mobility  Bed Mobility Overal bed mobility: Modified Independent Bed Mobility: Supine to Sit     Supine to sit: Supervision     General bed mobility comments: labored movement, increased time    Transfers Overall transfer level: Needs assistance Equipment used: Rolling walker (2 wheels) Transfers: Sit to/from Stand, Bed to chair/wheelchair/BSC Sit to Stand: Min assist, Mod assist   Step pivot transfers: Min assist       General transfer comment: Patient requiring min/mod assist to stand due to generalized weakness, min assist for transfer to chair with RW    Ambulation/Gait Ambulation/Gait assistance: Min assist Gait Distance (Feet): 45 Feet Assistive device: Rolling walker (2 wheels) Gait Pattern/deviations: Decreased step length - right, Decreased step length - left, Decreased stride length, Trunk flexed Gait velocity: decreased     General Gait Details: Patient slightly unsteady on feet, able to ambulate in hallway with RW requiring one short standing rest break, limited mostly due to fatigue with patient demonstrating increased trunk flexion and fatigue with increased distance  Stairs            Wheelchair Mobility    Modified Rankin (Stroke Patients Only)       Balance Overall balance assessment: Needs assistance  Sitting-balance support: Feet supported, Bilateral  upper extremity supported Sitting balance-Leahy Scale: Good Sitting balance - Comments: seated EOB   Standing balance support: Bilateral upper extremity supported, During functional activity, Reliant on assistive device for balance Standing balance-Leahy Scale: Fair Standing balance comment: with RW                             Pertinent Vitals/Pain Pain Assessment Pain Assessment: 0-10 Pain Score: 4  Pain Location: head, stomach Pain Descriptors / Indicators: Aching, Headache, Sore Pain Intervention(s): Limited activity within patient's tolerance, Monitored during session, Repositioned    Home Living Family/patient expects to be discharged to:: Private residence Living Arrangements: Parent (mom) Available Help at Discharge: Family;Available PRN/intermittently Type of Home: House Home Access: Stairs to enter Entrance Stairs-Rails: Right;Left Entrance Stairs-Number of Steps: 2   Home Layout: One level Home Equipment: Clinical biochemist (2 wheels);Cane - single point      Prior Function Prior Level of Function : Independent/Modified Independent;Driving             Mobility Comments: Patient is a short distance community ambulator w/ RW, drives to dialysis and grocery store ADLs Comments: Independent     Hand Dominance   Dominant Hand: Right    Extremity/Trunk Assessment   Upper Extremity Assessment Upper Extremity Assessment: Generalized weakness    Lower Extremity Assessment Lower Extremity Assessment: Generalized weakness    Cervical / Trunk Assessment Cervical / Trunk Assessment: Normal  Communication   Communication: No difficulties  Cognition Arousal/Alertness: Awake/alert Behavior During Therapy: WFL for tasks assessed/performed Overall Cognitive Status: Within Functional Limits for tasks assessed                                          General Comments      Exercises     Assessment/Plan    PT  Assessment Patient needs continued PT services  PT Problem List Decreased strength;Decreased activity tolerance;Decreased balance;Decreased mobility       PT Treatment Interventions DME instruction;Balance training;Gait training;Stair training;Functional mobility training;Patient/family education;Therapeutic activities;Therapeutic exercise    PT Goals (Current goals can be found in the Care Plan section)  Acute Rehab PT Goals Patient Stated Goal: return home after rehab PT Goal Formulation: With patient Time For Goal Achievement: 10/24/22 Potential to Achieve Goals: Good    Frequency Min 3X/week     Co-evaluation               AM-PAC PT "6 Clicks" Mobility  Outcome Measure Help needed turning from your back to your side while in a flat bed without using bedrails?: None Help needed moving from lying on your back to sitting on the side of a flat bed without using bedrails?: None Help needed moving to and from a bed to a chair (including a wheelchair)?: A Little Help needed standing up from a chair using your arms (e.g., wheelchair or bedside chair)?: A Lot Help needed to walk in hospital room?: A Little Help needed climbing 3-5 steps with a railing? : A Lot 6 Click Score: 18    End of Session Equipment Utilized During Treatment: Gait belt Activity Tolerance: Patient tolerated treatment well;Patient limited by fatigue Patient left: in chair;with call bell/phone within reach Nurse Communication: Mobility status PT Visit Diagnosis: Unsteadiness on feet (R26.81);Other abnormalities of gait and mobility (R26.89);Muscle weakness (generalized) (M62.81)  Time: 2589-4834 PT Time Calculation (min) (ACUTE ONLY): 20 min   Charges:   PT Evaluation $PT Eval Moderate Complexity: 1 Mod PT Treatments $Therapeutic Activity: 8-22 mins        Zigmund Gottron, SPT

## 2022-10-11 ENCOUNTER — Ambulatory Visit: Payer: Medicare Other | Admitting: Cardiology

## 2022-10-11 DIAGNOSIS — I200 Unstable angina: Secondary | ICD-10-CM

## 2022-10-11 DIAGNOSIS — I5033 Acute on chronic diastolic (congestive) heart failure: Secondary | ICD-10-CM | POA: Diagnosis not present

## 2022-10-11 DIAGNOSIS — J9601 Acute respiratory failure with hypoxia: Secondary | ICD-10-CM | POA: Diagnosis not present

## 2022-10-11 DIAGNOSIS — N186 End stage renal disease: Secondary | ICD-10-CM | POA: Diagnosis not present

## 2022-10-11 DIAGNOSIS — J9611 Chronic respiratory failure with hypoxia: Secondary | ICD-10-CM | POA: Diagnosis not present

## 2022-10-11 DIAGNOSIS — E1122 Type 2 diabetes mellitus with diabetic chronic kidney disease: Secondary | ICD-10-CM

## 2022-10-11 LAB — GLUCOSE, CAPILLARY
Glucose-Capillary: 328 mg/dL — ABNORMAL HIGH (ref 70–99)
Glucose-Capillary: 416 mg/dL — ABNORMAL HIGH (ref 70–99)
Glucose-Capillary: 251 mg/dL — ABNORMAL HIGH (ref 70–99)
Glucose-Capillary: 336 mg/dL — ABNORMAL HIGH (ref 70–99)

## 2022-10-11 LAB — HEPATITIS B SURFACE ANTIGEN: Hepatitis B Surface Ag: NONREACTIVE

## 2022-10-11 MED ORDER — CALCITRIOL 0.25 MCG PO CAPS
1.7500 ug | ORAL_CAPSULE | ORAL | Status: DC
  Filled 2022-10-11: qty 7

## 2022-10-11 MED ORDER — LANTHANUM CARBONATE 500 MG PO CHEW
1000.0000 mg | CHEWABLE_TABLET | Freq: Three times a day (TID) | ORAL | Status: DC
  Administered 2022-10-11 – 2022-10-12 (×3): 1000 mg via ORAL
  Filled 2022-10-11 (×7): qty 2

## 2022-10-11 MED ORDER — INSULIN GLARGINE-YFGN 100 UNIT/ML ~~LOC~~ SOLN
32.0000 [IU] | Freq: Every day | SUBCUTANEOUS | Status: DC
  Administered 2022-10-11: 32 [IU] via SUBCUTANEOUS
  Filled 2022-10-11 (×2): qty 0.32

## 2022-10-11 MED ORDER — CHLORHEXIDINE GLUCONATE CLOTH 2 % EX PADS
6.0000 | MEDICATED_PAD | Freq: Every day | CUTANEOUS | Status: DC
  Administered 2022-10-11: 6 via TOPICAL

## 2022-10-11 NOTE — Progress Notes (Signed)
PROGRESS NOTE  [name] ENM:[id number] DOB: 24-Jan-1951 DOA: 10/09/2022 PCP: [name], MD  Brief History:  71 year old female with a history of ESRD (TTS), diastolic CHF, permanent atrial fibrillation, coronary disease, COPD, diabetes mellitus type 2, hypertension, hyperlipidemia presenting with generalized weakness and shortness of breath.  The patient states that she was quite weak even when she left the hospital after recent hospitalization from 09/29/2022 to 10/04/2022.  During that hospitalization, the patient was treated for fluid overload resulting in acute on chronic respiratory failure.  She was initially on BiPAP.  Her hospitalization was also prolonged secondary to COPD exacerbation.  She was discharged home with prednisone.  Her left upper extremity Gore-Tex graft was evaluated by Dr. Donnetta Hutching who felt that it was healing well and should be ready to use in about 2 weeks. Patient stated that since she got home she had progressive weakness and worsening shortness of breath.  She got to the point where she had difficulty getting off the couch.  She fell getting off the couch and hit her head on 10/07/2022.  She did not want to come to the emergency department at that time.  Her last dialysis was on 10/07/2022.  She endorses compliance with fluid restriction.  She has been staying for full sessions of dialysis.  In the last 24 hours prior to admission, the patient endorsed increasing generalized weakness and shortness of breath with associated substernal chest discomfort.  As result, she presented for further evaluation and treatment.  She denied fevers, chills, headache, hemoptysis, nausea, vomiting, diarrhea, abdominal pain. In the ED, the patient was afebrile hemodynamically stable with oxygen saturation 93% on room air.  WBC 23.7, hemoglobin 9.9, platelets 203.  Sodium 137, potassium 4.6, bicarbonate 25, serum creatinine 9.32.  AST 39, ALT 115, alk phosphatase 139, total bilirubin  1.2.  The patient was started on bronchodilators and Solu-Medrol.  Nephrology was consulted to assist with management.   Assessment/Plan: Acute on chronic respiratory failure with hypoxia -Secondary to COPD exacerbation -The patient is slightly on the hypervolemic side -Personally reviewed chest x-ray--vascular congestion -Stable on 3 L -The patient is normally on nighttime oxygen only at 3 L at home. -Patient is currently requiring 2 L nasal cannula supplementation. -Continue to wean oxygen as tolerated for saturation greater 90%  COPD exacerbation -Patient has at least 60-pack-year history tobacco, quit 2004 -Continue IV Solu-Medrol -Continue DuoNebs -Continue Pulmicort, continue Brovana and the use of flutter valve.  ESRD -Nephrology consulted for maintenance dialysis (TTS) -Last dialysis 10/10/2022 -Next dialysis treatment 10/12/2022.  Elevated troponin -Secondary to demand ischemia -09/30/2022 echo EF 55 to 60%, IV septal flattening, moderate elevated PASP -Troponin 83>> 99  Fluid overload -Chest x-ray suggest vascular congestion -Continue fluid removal via dialysis  Acute on chronic diastolic CHF -Primarily right heart failure -09/30/2022 echo EF 55 to 60%, interventricular septum flattening, mild MR -Low-sodium diet discussed with patient -Patient will benefit of decrease in her dry weight.  Permanent atrial fibrillation -Personally reviewed EKG--atrial fibrillation, no concerning ST-T wave change -Continue amiodarone -Continue apixaban -Continue diltiazem CD -Continue outpatient follow-up with cardiology service.  Controlled diabetes mellitus type 2 -09/30/2022 hemoglobin A1c 6.7 -NovoLog sliding scale -Anticipate elevated CBGs secondary to steroids. -Continue adjusted dose of Semglee.  Mixed hyperlipidemia -Continue statin -Heart healthy diet discussed with patient.  Intermittent hematuria -Patient states that has been present since June 2023 -CT renal  protocol demonstrating no acute findings to explain patient's symptoms; left  anterior midportion of the kidney demonstrating hyperdense lesion that could be hemorrhagic or proteinaceous cyst. -No obstructions appreciated. -outpatient urology referral recommended.  Family Communication: no  Family at bedside  Consultants:  renal  Code Status:  FULL  DVT Prophylaxis:  apixaban   Procedures: As Listed in Progress Note Above  Antibiotics: None  Subjective: Patient reports further improvement in her breathing; denies chest pain, no nausea, no vomiting and demonstrating good saturation on 2 L supplementation.  Continue to refer to feeling generally weak and deconditioned.  Objective: Vitals:   10/11/22 0522 10/11/22 0719 10/11/22 1206 10/11/22 1326  BP: (!) 176/85  (!) 139/99   Pulse: 73 76 80 74  Resp: _0 Temp: 97.9 F (36.6 C)  97.7 F (36.5 C)   TempSrc: Oral  Oral   SpO2: 96% 97% 100% 99%  Weight:      Height:        Intake/Output Summary (Last 24 hours) at 10/11/2022 1933 Last data filed at 10/11/2022 1900 Gross per 24 hour  Intake 720 ml  Output --  Net 720 ml   Weight change: 3.375 kg  Exam: General exam: Alert, awake, oriented x 3; able to speak in full sentences and denying chest pain, nausea or vomiting.  Good oxygen saturation on 2 L supplementation. Respiratory system: Improving mood bilaterally; no using accessory muscles.  Positive scattered rhonchi; no wheezing on exam. Cardiovascular system:RRR. No rubs or gallops; no JVD appreciated on exam. Gastrointestinal system: Abdomen is nondistended, soft and nontender. No organomegaly or masses felt. Normal bowel sounds heard. Central nervous system: Alert and oriented. No focal neurological deficits. Extremities: No cyanosis or clubbing. Skin: No petechiae. Psychiatry: Judgement and insight appear normal. Mood & affect appropriate.     Data Reviewed: I have personally reviewed following labs and  imaging studies  Basic Metabolic Panel: Recent Labs  Lab 10/09/22 1254 10/10/22 1201  NA 137 133*  K 4.6 6.2*  CL 97* 95*  CO2 25 17*  GLUCOSE 271* 358*  BUN 80* 103*  CREATININE 9.32* 10.76*  CALCIUM 10.0 9.2   Liver Function Tests: Recent Labs  Lab 10/09/22 1254 10/10/22 1201  AST 39 19  ALT 115* 79*  ALKPHOS 139* 79  BILITOT 1.2 1.3*  PROT 6.6 6.1*  ALBUMIN 3.7 3.4*   CBC: Recent Labs  Lab 10/09/22 1254 10/10/22 1201  WBC 23.7* 22.2*  NEUTROABS 20.5*  --   HGB 11.9* 11.5*  HCT 36.7 35.2*  MCV 86.2 86.3  PLT 203 186   CBG: Recent Labs  Lab 10/10/22 1703 10/10/22 2154 10/11/22 0736 10/11/22 1142 10/11/22 1629  GLUCAP 288* 367* 328* 416* 251*   HbA1C: No results for input(s): "HGBA1C" in the last 72 hours.  Urine analysis:    Component Value Date/Time   COLORURINE AMBER (A) 10/09/2022 1104   APPEARANCEUR CLOUDY (A) 10/09/2022 1104   APPEARANCEUR Cloudy (A) 06/28/2022 1054   LABSPEC 1.015 10/09/2022 1104   PHURINE 6.0 10/09/2022 1104   GLUCOSEU >=500 (A) 10/09/2022 1104   HGBUR LARGE (A) 10/09/2022 1104   BILIRUBINUR NEGATIVE 10/09/2022 1104   BILIRUBINUR Negative 06/28/2022 1054   KETONESUR NEGATIVE 10/09/2022 1104   PROTEINUR 100 (A) 10/09/2022 1104   UROBILINOGEN 0.2 01/03/2012 1650   NITRITE NEGATIVE 10/09/2022 1104   LEUKOCYTESUR NEGATIVE 10/09/2022 1104   Scheduled Meds:  amiodarone  200 mg Oral BID   apixaban  5 mg Oral BID   arformoterol  15 mcg Nebulization BID   aspirin  EC  81 mg Oral Daily   atenolol  50 mg Oral QHS   budesonide (PULMICORT) nebulizer solution  0.5 mg Nebulization BID   [START ON 10/12/2022] calcitRIOL  1.75 mcg Oral Q T,Th,Sa-HD   Chlorhexidine Gluconate Cloth  6 each Topical Q0600   diltiazem  240 mg Oral Daily   insulin aspart  0-15 Units Subcutaneous TID WC   insulin aspart  0-5 Units Subcutaneous QHS   insulin glargine-yfgn  32 Units Subcutaneous QHS   ipratropium-albuterol  3 mL Nebulization TID    lanthanum  1,000 mg Oral TID WC   loratadine  10 mg Oral Daily   methylPREDNISolone (SOLU-MEDROL) injection  60 mg Intravenous Q12H   multivitamin  1 tablet Oral Daily   rosuvastatin  5 mg Oral Daily   sodium chloride flush  3 mL Intravenous Q12H   Continuous Infusions:  sodium chloride     Procedures/Studies: CT RENAL STONE STUDY  Result Date: 10/10/2022 CLINICAL DATA:  Hematuria, low abdominal pain EXAM: CT ABDOMEN AND PELVIS WITHOUT CONTRAST TECHNIQUE: Multidetector CT imaging of the abdomen and pelvis was performed following the standard protocol without IV contrast. RADIATION DOSE REDUCTION: This exam was performed according to the departmental dose-optimization program which includes automated exposure control, adjustment of the mA and/or kV according to patient size and/or use of iterative reconstruction technique. COMPARISON:  10/01/2020. FINDINGS: Lower chest: Cardiomegaly. Three-vessel coronary artery calcifications and/or stents. Small right pleural effusion and associated atelectasis or consolidation. Trace left pleural effusion. Hepatobiliary: No solid liver abnormality is seen. Excreted contrast in the gallbladder. No gallstones, gallbladder wall thickening, or biliary dilatation. Pancreas: Unremarkable. No pancreatic ductal dilatation or surrounding inflammatory changes. Spleen: Normal in size without significant abnormality. Adrenals/Urinary Tract: Adrenal glands are unremarkable. Atrophic kidneys. Small renal vascular calcifications and/or nonobstructive calculi. No ureteral calculi or hydronephrosis. Subcentimeter hyperdense lesion of the anterior midportion of the left kidney measuring 0.7 cm (series 2, image 29), new compared to prior examination dated 10/01/2020. Bladder is unremarkable. Stomach/Bowel: Stomach is within normal limits. Appendix appears normal. No evidence of bowel wall thickening, distention, or inflammatory changes. Vascular/Lymphatic: Aortic atherosclerosis. No  enlarged abdominal or pelvic lymph nodes. Reproductive: Status post hysterectomy. Other: No abdominal wall hernia or abnormality.  Trace ascites. Musculoskeletal: No acute or significant osseous findings. IMPRESSION: 1. No acute CT findings of the abdomen or pelvis to explain pain. 2. Small pleural effusions and trace ascites. 3. Atrophic kidneys. Small renal vascular calcifications and/or nonobstructive calculi. No ureteral calculi or hydronephrosis. 4. Subcentimeter hyperdense lesion of the anterior midportion of the left kidney measuring 0.7 cm , new compared to prior examination dated 10/01/2020. This is most likely a hemorrhagic or proteinaceous cyst, however a small solid renal mass is not excluded. Consider initial renal ultrasound to confirm cystic character. 5. Cardiomegaly and coronary artery disease. Aortic Atherosclerosis (ICD10-I70.0). Electronically Signed   By: Delanna Ahmadi M.D.   On: 10/10/2022 10:28   DG Chest 2 View  Result Date: 10/09/2022 CLINICAL DATA:  Weakness, possible pneumonia. History of COPD and congestive heart failure. Dialysis patient. EXAM: CHEST - 2 VIEW COMPARISON:  10/01/2022 FINDINGS: Right IJ dialysis catheter tip: Right atrium. Lower cervical plate and screw fixator. Atherosclerotic calcification of the aortic arch. Mild to moderate enlargement of the cardiopericardial silhouette with upper zone pulmonary vascular prominence suggesting pulmonary venous hypertension, but no overt edema. Lower thoracic spondylosis. No pneumonia identified.  No blunting of the costophrenic angles. IMPRESSION: 1. Mild to moderate enlargement of the cardiopericardial silhouette with  pulmonary venous hypertension but no overt edema. 2. Right IJ dialysis catheter tip: Right atrium. 3. Lower thoracic spondylosis. Electronically Signed   By: Van Clines M.D.   On: 10/09/2022 14:14   CT Head Wo Contrast  Result Date: 10/09/2022 CLINICAL DATA:  Head trauma EXAM: CT HEAD WITHOUT CONTRAST  TECHNIQUE: Contiguous axial images were obtained from the base of the skull through the vertex without intravenous contrast. RADIATION DOSE REDUCTION: This exam was performed according to the departmental dose-optimization program which includes automated exposure control, adjustment of the mA and/or kV according to patient size and/or use of iterative reconstruction technique. COMPARISON:  CT dated June 14, 2022 FINDINGS: Brain: Chronic white matter ischemic change. No evidence of acute infarction, hemorrhage, hydrocephalus, extra-axial collection or mass lesion/mass effect. Vascular: No hyperdense vessel or unexpected calcification. Skull: Normal. Negative for fracture or focal lesion. Sinuses/Orbits: Mucosal thickening of the right sphenoid sinus findings can be seen the setting of chronic sinusitis. No acute abnormality. Other: None. IMPRESSION: No acute intracranial abnormality. Electronically Signed   By: Yetta Glassman M.D.   On: 10/09/2022 13:14   LONG TERM MONITOR (3-14 DAYS)  Result Date: 10/08/2022 Patch Wear Time:  6 days and 21 hours 100% atrial fibrillation burden Less than 1% ventricular ectopy No triggered episodes recorded Will Camnitz, MD   US Venous Img Upper Uni Right(DVT)  Result Date: 10/02/2022 CLINICAL DATA:  Upper extremity swelling. EXAM: RIGHT UPPER EXTREMITY VENOUS DOPPLER ULTRASOUND TECHNIQUE: Gray-scale sonography with graded compression, as well as color Doppler and duplex ultrasound were performed to evaluate the upper extremity deep venous system from the level of the subclavian vein and including the jugular, axillary, basilic, radial, ulnar and upper cephalic vein. Spectral Doppler was utilized to evaluate flow at rest and with distal augmentation maneuvers. COMPARISON:  Chest XR, 10/02/2019. FINDINGS: VENOUS Dialysis catheter within the imaged R IJV. Otherwise normal compressibility of the RIGHT internal jugular, subclavian, axillary, cephalic, basilic, brachial, radial  and ulnar veins. No filling defects to suggest DVT on grayscale or color Doppler imaging. Doppler waveforms show normal direction of venous flow, normal respiratory plasticity and response to augmentation. Limited views of the contralateral subclavian vein are unremarkable. OTHER RIGHT upper extremity arteriovenous fistula with enlargement of the RIGHT brachial vein and thrill within the axillary vein. No evidence of superficial thrombophlebitis or abnormal fluid collection. Limitations: Dressings overlying the RIGHT neck and chest limiting evaluation of the R IJV and venous confluence. IMPRESSION: Suboptimal evaluation, within these constraints; 1. No definitive evidence of DVT or superficial thrombophlebitis within the RIGHT upper extremity. 2. Ipsilateral (RIGHT) upper extremity fistula and dialysis catheter. Findings likely to represent the etiology of patient's reported upper extremity swelling. Electronically Signed   By: Michaelle Birks M.D.   On: 10/02/2022 10:39   DG CHEST PORT 1 VIEW  Result Date: 10/01/2022 CLINICAL DATA:  Shortness of breath. EXAM: PORTABLE CHEST 1 VIEW COMPARISON:  Chest x-ray 09/29/2022 FINDINGS: Right-sided central venous catheter tip projects over the cavoatrial junction, unchanged. The heart is enlarged, unchanged. There is central pulmonary vascular congestion. There is no focal lung consolidation, pleural effusion or pneumothorax. No acute fractures are seen. Cervical spinal fusion plate is again noted. IMPRESSION: Cardiomegaly with central pulmonary vascular congestion. Electronically Signed   By: Ronney Asters M.D.   On: 10/01/2022 16:28   ECHOCARDIOGRAM COMPLETE  Result Date: 09/30/2022    ECHOCARDIOGRAM REPORT   Patient Name:   LUCILLIA CORSON Date of Exam: 09/30/2022 Medical Rec #:  [id number]  Height:       63.0 in Accession #:    [id number]      Weight:       178.1 lb Date of Birth:  [birth year]       BSA:          1.841 m Patient Age:    34 years        BP:            164/50 mmHg Patient Gender: F               HR:           82 bpm. Exam Location:  [facility] Procedure: 2D Echo, Cardiac Doppler and Color Doppler Indications:    Congestive Heart Failure I50.9  History:        Patient has prior history of Echocardiogram examinations, most                 recent 03/05/2022. CHF, CAD and Previous Myocardial Infarction,                 COPD, Arrythmias:Atrial Fibrillation; Risk Factors:Hypertension,                 Diabetes and Dyslipidemia. ESRD (end stage renal disease) on                 dialysis.  Sonographer:    Alvino Chapel RCS Referring Phys: [id number] ASIA B [place]  1. Left ventricular ejection fraction, by estimation, is 55 to 60%. The left ventricle has normal function. Left ventricular endocardial border not optimally defined to evaluate regional wall motion. There is moderate asymmetric left ventricular hypertrophy of the lateral segment. Left ventricular diastolic parameters are indeterminate. There is the interventricular septum is flattened in diastole ('D' shaped left ventricle), consistent with right ventricular volume overload and the interventricular septum is flattened in systole, consistent with right ventricular pressure overload.  2. Right ventricular systolic function is mildly reduced. The right ventricular size is normal. There is mildly elevated pulmonary artery systolic pressure. The estimated right ventricular systolic pressure is 61.6 mmHg.  3. Left atrial size was moderately dilated.  4. There is no evidence of cardiac tamponade.  5. The mitral valve is degenerative. Mild mitral valve regurgitation. No evidence of mitral stenosis.  6. The aortic valve is tricuspid. There is mild calcification of the aortic valve. Aortic valve regurgitation is not visualized. Aortic valve sclerosis is present, with no evidence of aortic valve stenosis. Comparison(s): Slight increase in RVSP from 2022. FINDINGS  Left Ventricle: Left ventricular ejection  fraction, by estimation, is 55 to 60%. The left ventricle has normal function. Left ventricular endocardial border not optimally defined to evaluate regional wall motion. The left ventricular internal cavity size was normal in size. There is moderate asymmetric left ventricular hypertrophy of the lateral segment. The interventricular septum is flattened in diastole ('D' shaped left ventricle), consistent with right ventricular volume overload and the interventricular septum is flattened in systole, consistent with right ventricular pressure overload. Left ventricular diastolic parameters are indeterminate. Right Ventricle: The right ventricular size is normal. No increase in right ventricular wall thickness. Right ventricular systolic function is mildly reduced. There is mildly elevated pulmonary artery systolic pressure. The tricuspid regurgitant velocity  is 2.66 m/s, and with an assumed right atrial pressure of 15 mmHg, the estimated right ventricular systolic pressure is 07.3 mmHg. Left Atrium: Left atrial size was moderately dilated. Right Atrium: Right atrial size was normal in size.  Pericardium: Trivial pericardial effusion is present. The pericardial effusion is circumferential. There is no evidence of cardiac tamponade. Mitral Valve: The mitral valve is degenerative in appearance. Mild mitral valve regurgitation. No evidence of mitral valve stenosis. Tricuspid Valve: The tricuspid valve is normal in structure. Tricuspid valve regurgitation is mild . No evidence of tricuspid stenosis. Aortic Valve: The aortic valve is tricuspid. There is mild calcification of the aortic valve. There is mild aortic valve annular calcification. Aortic valve regurgitation is not visualized. Aortic valve sclerosis is present, with no evidence of aortic valve stenosis. Pulmonic Valve: The pulmonic valve was normal in structure. Pulmonic valve regurgitation is not visualized. No evidence of pulmonic stenosis. Aorta: The aortic  root is normal in size and structure. IAS/Shunts: No atrial level shunt detected by color flow Doppler.  LEFT VENTRICLE PLAX 2D LVIDd:         4.50 cm LVIDs:         2.90 cm LV PW:         1.50 cm LV IVS:        1.20 cm LVOT diam:     1.50 cm LV SV:         29 LV SV Index:   16 LVOT Area:     1.77 cm  RIGHT VENTRICLE TAPSE (M-mode): 1.3 cm LEFT ATRIUM             Index        RIGHT ATRIUM           Index LA diam:        4.70 cm 2.55 cm/m   RA Area:     20.70 cm LA Vol (A2C):   86.9 ml 47.21 ml/m  RA Volume:   57.70 ml  31.35 ml/m LA Vol (A4C):   68.0 ml 36.94 ml/m LA Biplane Vol: 77.1 ml 41.88 ml/m  AORTIC VALVE LVOT Vmax:   93.63 cm/s LVOT Vmean:  58.233 cm/s LVOT VTI:    0.166 m  AORTA Ao Root diam: 3.00 cm MITRAL VALVE                TRICUSPID VALVE MV Area (PHT): 4.80 cm     TR Peak grad:   28.3 mmHg MV Decel Time: 158 msec     TR Vmax:        266.00 cm/s MV E velocity: 140.00 cm/s MV A velocity: 45.10 cm/s   SHUNTS MV E/A ratio:  3.10         Systemic VTI:  0.17 m                             Systemic Diam: 1.50 cm Rudean Haskell MD Electronically signed by Rudean Haskell MD Signature Date/Time: 09/30/2022/1:37:40 PM    Final    DG Chest 2 View  Result Date: 09/29/2022 CLINICAL DATA:  Chest pain EXAM: CHEST - 2 VIEW COMPARISON:  Chest x-ray dated August 15, 2022; chest CT dated May 15, 2022 FINDINGS: Unchanged cardiomegaly. Mild diffuse bilateral interstitial opacities. Linear opacity of the right lower lobe, likely due to atelectasis. Indeterminate focal nodular opacities seen in the right lower lobe, new when compared with prior chest x-ray. No large pleural effusion or pneumothorax. Unchanged position of right-sided dialysis catheter. IMPRESSION: 1. Mild diffuse bilateral interstitial opacities, likely due to pulmonary edema. 2. Indeterminate focal nodular opacity seen in the right lower lobe, possibly atelectasis, although given nodules were present on prior chest  CT dated May 15, 2022. Recommend further evaluation with repeat chest CT to exclude enlargement. Electronically Signed   By: Yetta Glassman M.D.   On: 09/29/2022 17:53    Barton Dubois, MD  [facility]  If 7PM-7AM, please contact night-coverage [email or web address] Password [email or web address] 10/11/2022, 7:33 PM   LOS: 1 day

## 2022-10-11 NOTE — TOC Progression Note (Addendum)
Transition of Care [facility]) - Progression Note    Patient Details  Name: [name] MRN: [id number] Date of Birth: 23-Nov-1951  [facility] CM/SW Contact  Shade Flood, LCSW Phone Number: 10/11/2022, 1:52 PM  Clinical Narrative:     Pt has approval for dc to SNF today. Updated MD at 12pm. Also notified MD of need for dc summary by 1415. If not received, pt will not be able to dc until tomorrow.  1404: Per MD, plan is now for dc tomorrow as pt needs another in hospital HD treatment. Requested early treatment from HD RN. Updated [facility]. Will follow up in AM.  Expected Discharge Plan: [place] Barriers to Discharge: Continued Medical Work up  Expected Discharge Plan and Services Expected Discharge Plan: [place] In-house Referral: Clinical Social Work   Post Acute Care Choice: [place] Living arrangements for the past 2 months: [place] Determinants of Health (SDOH) Interventions    Readmission Risk Interventions    10/02/2022   11:12 AM 05/31/2022    8:33 AM 04/28/2022    2:04 PM  Readmission Risk Prevention Plan  Transportation Screening Complete Complete Complete  Medication Review Press photographer) Complete Complete Complete  PCP or Specialist appointment within 3-5 days of discharge Not Complete  Complete  HRI or [place] Complete Complete Complete  SW Recovery Care/Counseling Consult Complete    Palliative Care Screening Not Applicable Not [place] Not Applicable Not Applicable

## 2022-10-11 NOTE — Progress Notes (Signed)
Subjective:  HD yesterday -  removed 3800 -  post weight 78.2-  number of concerns-  knots on her legs, hematuria  Objective Vital signs in last 24 hours: Vitals:   10/10/22 2020 10/10/22 2151 10/11/22 0522 10/11/22 0719  BP:  130/65 (!) 176/85   Pulse:  71 73 76  Resp:  20 20 16   Temp:  98.1 F (36.7 C) 97.9 F (36.6 C)   TempSrc:   Oral   SpO2: 95% 97% 96% 97%  Weight:      Height:       Weight change: 3.375 kg  Intake/Output Summary (Last 24 hours) at 10/11/2022 0737 Last data filed at 10/10/2022 1545 Gross per 24 hour  Intake 240 ml  Output 3800 ml  Net -3560 ml   Outpt HD Orders from Last Admit: [facility]  TTS , 3:45,  EDW 80 kg ( she says now 79.5), 1 K /2.5 calc,  heparin - yes, calcitriol 1.75 TIW, mircera 100 q 2 weeks, venofer 50 weekly-  using TDC 350 BFR/500 DFR    Assessment/ Plan: Pt is a 71 y.o. yo female with ESRD who was admitted on 10/09/2022 with FTT and hypoxia  Assessment/Plan: 1. Hypoxia-  COPD but also volume overload-  UF as able with HD-   COPD management per hosps 2. ESRD - normally TTS at [facility]-  keeping on schedule here 3. Anemia- controlled-  no indication for ESA at present  4. Secondary hyperparathyroidism-  will continue OP calcitriol dosing and fosrenol 5. HTN/volume-  overloaded-  EDW decreased but probably needs to be decreased more 6. AVG infection?  Evald by Dr. Donnetta Hutching when last admitted-  area of concern does not affect the actual AVG- supposed to have  completed Abx on 11/4-  and to stick when wound is healed  7. FTT-  for short term SNF 8. Hematuria-  scan shows no stones-  new hyperdense lesion on kidney - will need GU follow up on discharge   [place]: Basic Metabolic Panel: Recent Labs  Lab 10/09/22 1254 10/10/22 1201  NA 137 133*  K 4.6 6.2*  CL 97* 95*  CO2 25 17*  GLUCOSE 271* 358*  BUN 80* 103*  CREATININE 9.32* 10.76*  CALCIUM 10.0 9.2   Liver Function Tests: Recent Labs  Lab  10/09/22 1254 10/10/22 1201  AST 39 19  ALT 115* 79*  ALKPHOS 139* 79  BILITOT 1.2 1.3*  PROT 6.6 6.1*  ALBUMIN 3.7 3.4*   No results for input(s): "LIPASE", "AMYLASE" in the last 168 hours. No results for input(s): "AMMONIA" in the last 168 hours. CBC: Recent Labs  Lab 10/09/22 1254 10/10/22 1201  WBC 23.7* 22.2*  NEUTROABS 20.5*  --   HGB 11.9* 11.5*  HCT 36.7 35.2*  MCV 86.2 86.3  PLT 203 186   Cardiac Enzymes: No results for input(s): "CKTOTAL", "CKMB", "CKMBINDEX", "TROPONINI" in the last 168 hours. CBG: Recent Labs  Lab 10/10/22 0714 10/10/22 1108 10/10/22 1703 10/10/22 2154 10/11/22 0736  GLUCAP 175* 293* 288* 367* 328*    Iron Studies: No results for input(s): "IRON", "TIBC", "TRANSFERRIN", "FERRITIN" in the last 72 hours. Studies/Results: CT RENAL STONE STUDY  Result Date: 10/10/2022 CLINICAL DATA:  Hematuria, low abdominal pain EXAM: CT ABDOMEN AND PELVIS WITHOUT CONTRAST TECHNIQUE: Multidetector CT imaging of the abdomen and pelvis was performed following the standard protocol without IV contrast. RADIATION DOSE REDUCTION: This exam was performed according to the departmental dose-optimization  program which includes automated exposure control, adjustment of the mA and/or kV according to patient size and/or use of iterative reconstruction technique. COMPARISON:  10/01/2020. FINDINGS: Lower chest: Cardiomegaly. Three-vessel coronary artery calcifications and/or stents. Small right pleural effusion and associated atelectasis or consolidation. Trace left pleural effusion. Hepatobiliary: No solid liver abnormality is seen. Excreted contrast in the gallbladder. No gallstones, gallbladder wall thickening, or biliary dilatation. Pancreas: Unremarkable. No pancreatic ductal dilatation or surrounding inflammatory changes. Spleen: Normal in size without significant abnormality. Adrenals/Urinary Tract: Adrenal glands are unremarkable. Atrophic kidneys. Small renal vascular  calcifications and/or nonobstructive calculi. No ureteral calculi or hydronephrosis. Subcentimeter hyperdense lesion of the anterior midportion of the left kidney measuring 0.7 cm (series 2, image 29), new compared to prior examination dated 10/01/2020. Bladder is unremarkable. Stomach/Bowel: Stomach is within normal limits. Appendix appears normal. No evidence of bowel wall thickening, distention, or inflammatory changes. Vascular/Lymphatic: Aortic atherosclerosis. No enlarged abdominal or pelvic lymph nodes. Reproductive: Status post hysterectomy. Other: No abdominal wall hernia or abnormality.  Trace ascites. Musculoskeletal: No acute or significant osseous findings. IMPRESSION: 1. No acute CT findings of the abdomen or pelvis to explain pain. 2. Small pleural effusions and trace ascites. 3. Atrophic kidneys. Small renal vascular calcifications and/or nonobstructive calculi. No ureteral calculi or hydronephrosis. 4. Subcentimeter hyperdense lesion of the anterior midportion of the left kidney measuring 0.7 cm , new compared to prior examination dated 10/01/2020. This is most likely a hemorrhagic or proteinaceous cyst, however a small solid renal mass is not excluded. Consider initial renal ultrasound to confirm cystic character. 5. Cardiomegaly and coronary artery disease. Aortic Atherosclerosis (ICD10-I70.0). Electronically Signed   By: Delanna Ahmadi M.D.   On: 10/10/2022 10:28   DG Chest 2 View  Result Date: 10/09/2022 CLINICAL DATA:  Weakness, possible pneumonia. History of COPD and congestive heart failure. Dialysis patient. EXAM: CHEST - 2 VIEW COMPARISON:  10/01/2022 FINDINGS: Right IJ dialysis catheter tip: Right atrium. Lower cervical plate and screw fixator. Atherosclerotic calcification of the aortic arch. Mild to moderate enlargement of the cardiopericardial silhouette with upper zone pulmonary vascular prominence suggesting pulmonary venous hypertension, but no overt edema. Lower thoracic  spondylosis. No pneumonia identified.  No blunting of the costophrenic angles. IMPRESSION: 1. Mild to moderate enlargement of the cardiopericardial silhouette with pulmonary venous hypertension but no overt edema. 2. Right IJ dialysis catheter tip: Right atrium. 3. Lower thoracic spondylosis. Electronically Signed   By: Van Clines M.D.   On: 10/09/2022 14:14   CT Head Wo Contrast  Result Date: 10/09/2022 CLINICAL DATA:  Head trauma EXAM: CT HEAD WITHOUT CONTRAST TECHNIQUE: Contiguous axial images were obtained from the base of the skull through the vertex without intravenous contrast. RADIATION DOSE REDUCTION: This exam was performed according to the departmental dose-optimization program which includes automated exposure control, adjustment of the mA and/or kV according to patient size and/or use of iterative reconstruction technique. COMPARISON:  CT dated June 14, 2022 FINDINGS: Brain: Chronic white matter ischemic change. No evidence of acute infarction, hemorrhage, hydrocephalus, extra-axial collection or mass lesion/mass effect. Vascular: No hyperdense vessel or unexpected calcification. Skull: Normal. Negative for fracture or focal lesion. Sinuses/Orbits: Mucosal thickening of the right sphenoid sinus findings can be seen the setting of chronic sinusitis. No acute abnormality. Other: None. IMPRESSION: No acute intracranial abnormality. Electronically Signed   By: Yetta Glassman M.D.   On: 10/09/2022 13:14   Medications: Infusions:  sodium chloride      Scheduled Medications:  amiodarone  200 mg Oral  BID   apixaban  5 mg Oral BID   arformoterol  15 mcg Nebulization BID   aspirin EC  81 mg Oral Daily   atenolol  50 mg Oral QHS   budesonide (PULMICORT) nebulizer solution  0.5 mg Nebulization BID   diltiazem  240 mg Oral Daily   Ferrous Fumarate  1 tablet Oral Daily   insulin aspart  0-15 Units Subcutaneous TID WC   insulin aspart  0-5 Units Subcutaneous QHS   insulin glargine-yfgn   25 Units Subcutaneous QHS   ipratropium-albuterol  3 mL Nebulization TID   loratadine  10 mg Oral Daily   methylPREDNISolone (SOLU-MEDROL) injection  60 mg Intravenous Q12H   multivitamin  1 tablet Oral Daily   rosuvastatin  5 mg Oral Daily   sodium chloride flush  3 mL Intravenous Q12H    have reviewed scheduled and prn medications.  Physical Exam: General: alert, NAD Heart: RRR Lungs: poor air movement  Abdomen: obese, non tender Extremities: no peripheral edema -  feels like just palpable veins  Dialysis Access: TDC-  AVG patent-  per HD RN-  pinhole area  of drainage-  dressing clean today -  if pinhole is much smaller than what it was last week   10/11/2022,7:37 AM  LOS: 1 day

## 2022-10-11 NOTE — Inpatient Diabetes Management (Signed)
Inpatient Diabetes Program Recommendations  AACE/ADA: New Consensus Statement on Inpatient Glycemic Control (2015)  Target Ranges:  Prepandial:   less than 140 mg/dL      Peak postprandial:   less than 180 mg/dL (1-2 hours)      Critically ill patients:  140 - 180 mg/dL   Lab Results  Component Value Date   GLUCAP 328 (H) 10/11/2022   HGBA1C 6.7 (H) 09/30/2022    Review of Glycemic Control  Latest Reference Range & Units 10/10/22 17:03 10/10/22 21:54 10/11/22 07:36  Glucose-Capillary 70 - 99 mg/dL 288 (H) 367 (H) 328 (H)  (H): Data is abnormally high Diabetes history: Type 2 Dm Outpatient Diabetes medications: Tresiba 25 units QHS, Novolog 0-5 units TID, Ozempic 0.5 mg Qwk Current orders for Inpatient glycemic control: Novolog 0-15 units TID & HS, Semglee 25 units QHS Solumedrol 60 mg BID  Inpatient Diabetes Program Recommendations:    In the setting of steroids, consider: - Increasing Semglee 32 units QHS -Adding Novolog 4 units TID (assuming patient is consuming >50% of meals).  Thanks, Bronson Curb, MSN, RNC-OB Diabetes Coordinator [phone] (8a-5p)

## 2022-10-12 DIAGNOSIS — N186 End stage renal disease: Secondary | ICD-10-CM | POA: Diagnosis not present

## 2022-10-12 DIAGNOSIS — N250 Renal osteodystrophy: Secondary | ICD-10-CM | POA: Diagnosis not present

## 2022-10-12 DIAGNOSIS — H04123 Dry eye syndrome of bilateral lacrimal glands: Secondary | ICD-10-CM | POA: Diagnosis not present

## 2022-10-12 DIAGNOSIS — R41841 Cognitive communication deficit: Secondary | ICD-10-CM | POA: Diagnosis not present

## 2022-10-12 DIAGNOSIS — I2510 Atherosclerotic heart disease of native coronary artery without angina pectoris: Secondary | ICD-10-CM | POA: Diagnosis not present

## 2022-10-12 DIAGNOSIS — D631 Anemia in chronic kidney disease: Secondary | ICD-10-CM | POA: Diagnosis not present

## 2022-10-12 DIAGNOSIS — E785 Hyperlipidemia, unspecified: Secondary | ICD-10-CM

## 2022-10-12 DIAGNOSIS — E119 Type 2 diabetes mellitus without complications: Secondary | ICD-10-CM | POA: Diagnosis not present

## 2022-10-12 DIAGNOSIS — E1169 Type 2 diabetes mellitus with other specified complication: Secondary | ICD-10-CM | POA: Diagnosis not present

## 2022-10-12 DIAGNOSIS — Z992 Dependence on renal dialysis: Secondary | ICD-10-CM | POA: Diagnosis not present

## 2022-10-12 DIAGNOSIS — N2581 Secondary hyperparathyroidism of renal origin: Secondary | ICD-10-CM | POA: Diagnosis not present

## 2022-10-12 DIAGNOSIS — R2689 Other abnormalities of gait and mobility: Secondary | ICD-10-CM | POA: Diagnosis not present

## 2022-10-12 DIAGNOSIS — R531 Weakness: Secondary | ICD-10-CM | POA: Diagnosis not present

## 2022-10-12 DIAGNOSIS — I132 Hypertensive heart and chronic kidney disease with heart failure and with stage 5 chronic kidney disease, or end stage renal disease: Secondary | ICD-10-CM | POA: Diagnosis not present

## 2022-10-12 DIAGNOSIS — J9601 Acute respiratory failure with hypoxia: Secondary | ICD-10-CM | POA: Diagnosis not present

## 2022-10-12 DIAGNOSIS — R2681 Unsteadiness on feet: Secondary | ICD-10-CM | POA: Diagnosis not present

## 2022-10-12 DIAGNOSIS — T827XXA Infection and inflammatory reaction due to other cardiac and vascular devices, implants and grafts, initial encounter: Secondary | ICD-10-CM | POA: Diagnosis not present

## 2022-10-12 DIAGNOSIS — J9621 Acute and chronic respiratory failure with hypoxia: Secondary | ICD-10-CM | POA: Diagnosis not present

## 2022-10-12 DIAGNOSIS — Z9981 Dependence on supplemental oxygen: Secondary | ICD-10-CM | POA: Diagnosis not present

## 2022-10-12 DIAGNOSIS — M6281 Muscle weakness (generalized): Secondary | ICD-10-CM | POA: Diagnosis not present

## 2022-10-12 DIAGNOSIS — I4821 Permanent atrial fibrillation: Secondary | ICD-10-CM | POA: Diagnosis not present

## 2022-10-12 DIAGNOSIS — I10 Essential (primary) hypertension: Secondary | ICD-10-CM | POA: Diagnosis not present

## 2022-10-12 DIAGNOSIS — D509 Iron deficiency anemia, unspecified: Secondary | ICD-10-CM | POA: Diagnosis not present

## 2022-10-12 DIAGNOSIS — I5033 Acute on chronic diastolic (congestive) heart failure: Secondary | ICD-10-CM | POA: Diagnosis not present

## 2022-10-12 DIAGNOSIS — H40013 Open angle with borderline findings, low risk, bilateral: Secondary | ICD-10-CM | POA: Diagnosis not present

## 2022-10-12 DIAGNOSIS — J441 Chronic obstructive pulmonary disease with (acute) exacerbation: Secondary | ICD-10-CM | POA: Diagnosis not present

## 2022-10-12 LAB — CBC
HCT: 35.2 % — ABNORMAL LOW (ref 36.0–46.0)
Hemoglobin: 11.8 g/dL — ABNORMAL LOW (ref 12.0–15.0)
MCH: 28.6 pg (ref 26.0–34.0)
MCHC: 33.5 g/dL (ref 30.0–36.0)
MCV: 85.2 fL (ref 80.0–100.0)
Platelets: 167 10*3/uL (ref 150–400)
RBC: 4.13 MIL/uL (ref 3.87–5.11)
RDW: 19.8 % — ABNORMAL HIGH (ref 11.5–15.5)
WBC: 28.2 10*3/uL — ABNORMAL HIGH (ref 4.0–10.5)
nRBC: 0.1 % (ref 0.0–0.2)

## 2022-10-12 LAB — RENAL FUNCTION PANEL
Albumin: 3.3 g/dL — ABNORMAL LOW (ref 3.5–5.0)
Anion gap: 19 — ABNORMAL HIGH (ref 5–15)
BUN: 106 mg/dL — ABNORMAL HIGH (ref 8–23)
CO2: 20 mmol/L — ABNORMAL LOW (ref 22–32)
Calcium: 9.5 mg/dL (ref 8.9–10.3)
Chloride: 94 mmol/L — ABNORMAL LOW (ref 98–111)
Creatinine, Ser: 9.88 mg/dL — ABNORMAL HIGH (ref 0.44–1.00)
GFR, Estimated: 4 mL/min — ABNORMAL LOW (ref >60)
Glucose, Bld: 358 mg/dL — ABNORMAL HIGH (ref 70–99)
Phosphorus: 9 mg/dL — ABNORMAL HIGH (ref 2.5–4.6)
Potassium: 5.2 mmol/L — ABNORMAL HIGH (ref 3.5–5.1)
Sodium: 133 mmol/L — ABNORMAL LOW (ref 135–145)

## 2022-10-12 LAB — HEPATITIS B SURFACE ANTIBODY, QUANTITATIVE: Hep B S AB Quant (Post): 37.1 m[IU]/mL (ref >9.9)

## 2022-10-12 LAB — GLUCOSE, CAPILLARY: Glucose-Capillary: 271 mg/dL — ABNORMAL HIGH (ref 70–99)

## 2022-10-12 MED ORDER — GABAPENTIN 300 MG PO CAPS: 300.0000 mg | ORAL_CAPSULE | Freq: Every day | ORAL | 1 refills | Status: AC

## 2022-10-12 MED ORDER — CALCITRIOL 0.25 MCG PO CAPS: 1.7500 ug | ORAL_CAPSULE | ORAL | Status: AC

## 2022-10-12 MED ORDER — HEPARIN SODIUM (PORCINE) 1000 UNIT/ML DIALYSIS
20.0000 [IU]/kg | INTRAMUSCULAR | Status: DC | PRN
  Administered 2022-10-12: 1600 [IU] via INTRAVENOUS_CENTRAL

## 2022-10-12 MED ORDER — PREDNISONE 20 MG PO TABS: ORAL_TABLET | ORAL | 0 refills | Status: AC

## 2022-10-12 NOTE — Procedures (Signed)
Patient alert and oriented, brought to unit in bed by floor nurse and tech. Informed consent signed and in chart.   Treatment initiated: 0904 Treatment completed: 1300  Patient tolerated well. Transported back to unit by floor nurse. Patient is alert and not in acute distress.  Hand-off given to patient's nurse, Rise Patience, LPN.  Access used: Temp HD Cath Access issues: No issues   Total UF removed: 3.8 L Medication(s) given: none  Post HD VS: Temperature: 98 Blood Pressure: 164/62 (81) Heart Rate: 88 Atrial fib Respiratory Rate: 17 Oxygen Saturation: 100% RA  Post HD weight: 76.5 kg  Morton Peters, RN, BSN [facility]

## 2022-10-12 NOTE — Progress Notes (Signed)
Subjective:  no overnight issues-  planning on discharge to SNF today after HD ?   Objective Vital signs in last 24 hours: Vitals:   10/11/22 2022 10/11/22 2030 10/12/22 0037 10/12/22 0546  BP:  (!) 152/70 122/76 116/67  Pulse:  69 70 70  Resp:  18 18 18   Temp:  97.7 F (36.5 C) 97.8 F (36.6 C) 97.6 F (36.4 C)  TempSrc:  Oral Oral Oral  SpO2: 96% 100% 99% 100%  Weight:      Height:       Weight change:   Intake/Output Summary (Last 24 hours) at 10/12/2022 0737 Last data filed at 10/11/2022 1900 Gross per 24 hour  Intake 720 ml  Output --  Net 720 ml   Outpt HD Orders from Last Admit: [facility]  TTS , 3:45,  EDW 80 kg ( she says now 79.5), 1 K /2.5 calc,  heparin - yes, calcitriol 1.75 TIW, mircera 100 q 2 weeks, venofer 50 weekly-  using TDC 350 BFR/500 DFR    Assessment/ Plan: Pt is a 71 y.o. yo female with ESRD who was admitted on 10/09/2022 with FTT and hypoxia  Assessment/Plan: 1. Hypoxia-  COPD but also volume overload-  UF as able with HD-   COPD management per hosps 2. ESRD - normally TTS at [facility]-  keeping on schedule here-  HD today via [place] 3. Anemia- controlled-  no indication for ESA at present  4. Secondary hyperparathyroidism-  will continue OP calcitriol dosing and fosrenol 5. HTN/volume-  overloaded-  EDW decreased but probably needs to be decreased more.  Will communicate post weight today to home HD unit 6. AVG infection?  Evald by Dr. Donnetta Hutching when last admitted-  area of concern does not affect the actual AVG- supposed to have  completed Abx on 11/4-  and to stick when wound is healed  7. FTT-  for short term SNF-  discharge today ?  8. Hematuria-  scan shows no stones-  new hyperdense lesion on kidney - will need GU follow up on discharge   [place]: Basic Metabolic Panel: Recent Labs  Lab 10/09/22 1254 10/10/22 1201  NA 137 133*  K 4.6 6.2*  CL 97* 95*  CO2 25 17*  GLUCOSE 271* 358*  BUN 80* 103*  CREATININE 9.32*  10.76*  CALCIUM 10.0 9.2   Liver Function Tests: Recent Labs  Lab 10/09/22 1254 10/10/22 1201  AST 39 19  ALT 115* 79*  ALKPHOS 139* 79  BILITOT 1.2 1.3*  PROT 6.6 6.1*  ALBUMIN 3.7 3.4*   No results for input(s): "LIPASE", "AMYLASE" in the last 168 hours. No results for input(s): "AMMONIA" in the last 168 hours. CBC: Recent Labs  Lab 10/09/22 1254 10/10/22 1201  WBC 23.7* 22.2*  NEUTROABS 20.5*  --   HGB 11.9* 11.5*  HCT 36.7 35.2*  MCV 86.2 86.3  PLT 203 186   Cardiac Enzymes: No results for input(s): "CKTOTAL", "CKMB", "CKMBINDEX", "TROPONINI" in the last 168 hours. CBG: Recent Labs  Lab 10/10/22 2154 10/11/22 0736 10/11/22 1142 10/11/22 1629 10/11/22 2027  GLUCAP 367* 328* 416* 251* 336*    Iron Studies: No results for input(s): "IRON", "TIBC", "TRANSFERRIN", "FERRITIN" in the last 72 hours. Studies/Results: CT RENAL STONE STUDY  Result Date: 10/10/2022 CLINICAL DATA:  Hematuria, low abdominal pain EXAM: CT ABDOMEN AND PELVIS WITHOUT CONTRAST TECHNIQUE: Multidetector CT imaging of the abdomen and pelvis was performed following the standard protocol without  IV contrast. RADIATION DOSE REDUCTION: This exam was performed according to the departmental dose-optimization program which includes automated exposure control, adjustment of the mA and/or kV according to patient size and/or use of iterative reconstruction technique. COMPARISON:  10/01/2020. FINDINGS: Lower chest: Cardiomegaly. Three-vessel coronary artery calcifications and/or stents. Small right pleural effusion and associated atelectasis or consolidation. Trace left pleural effusion. Hepatobiliary: No solid liver abnormality is seen. Excreted contrast in the gallbladder. No gallstones, gallbladder wall thickening, or biliary dilatation. Pancreas: Unremarkable. No pancreatic ductal dilatation or surrounding inflammatory changes. Spleen: Normal in size without significant abnormality. Adrenals/Urinary Tract:  Adrenal glands are unremarkable. Atrophic kidneys. Small renal vascular calcifications and/or nonobstructive calculi. No ureteral calculi or hydronephrosis. Subcentimeter hyperdense lesion of the anterior midportion of the left kidney measuring 0.7 cm (series 2, image 29), new compared to prior examination dated 10/01/2020. Bladder is unremarkable. Stomach/Bowel: Stomach is within normal limits. Appendix appears normal. No evidence of bowel wall thickening, distention, or inflammatory changes. Vascular/Lymphatic: Aortic atherosclerosis. No enlarged abdominal or pelvic lymph nodes. Reproductive: Status post hysterectomy. Other: No abdominal wall hernia or abnormality.  Trace ascites. Musculoskeletal: No acute or significant osseous findings. IMPRESSION: 1. No acute CT findings of the abdomen or pelvis to explain pain. 2. Small pleural effusions and trace ascites. 3. Atrophic kidneys. Small renal vascular calcifications and/or nonobstructive calculi. No ureteral calculi or hydronephrosis. 4. Subcentimeter hyperdense lesion of the anterior midportion of the left kidney measuring 0.7 cm , new compared to prior examination dated 10/01/2020. This is most likely a hemorrhagic or proteinaceous cyst, however a small solid renal mass is not excluded. Consider initial renal ultrasound to confirm cystic character. 5. Cardiomegaly and coronary artery disease. Aortic Atherosclerosis (ICD10-I70.0). Electronically Signed   By: Delanna Ahmadi M.D.   On: 10/10/2022 10:28   Medications: Infusions:  sodium chloride      Scheduled Medications:  amiodarone  200 mg Oral BID   apixaban  5 mg Oral BID   arformoterol  15 mcg Nebulization BID   aspirin EC  81 mg Oral Daily   atenolol  50 mg Oral QHS   budesonide (PULMICORT) nebulizer solution  0.5 mg Nebulization BID   calcitRIOL  1.75 mcg Oral Q T,Th,Sa-HD   Chlorhexidine Gluconate Cloth  6 each Topical Q0600   diltiazem  240 mg Oral Daily   insulin aspart  0-15 Units  Subcutaneous TID WC   insulin aspart  0-5 Units Subcutaneous QHS   insulin glargine-yfgn  32 Units Subcutaneous QHS   ipratropium-albuterol  3 mL Nebulization TID   lanthanum  1,000 mg Oral TID WC   loratadine  10 mg Oral Daily   methylPREDNISolone (SOLU-MEDROL) injection  60 mg Intravenous Q12H   multivitamin  1 tablet Oral Daily   rosuvastatin  5 mg Oral Daily   sodium chloride flush  3 mL Intravenous Q12H    have reviewed scheduled and prn medications.  Physical Exam: General: alert, NAD Heart: RRR Lungs: poor air movement  Abdomen: obese, non tender Extremities: no peripheral edema -  feels like just palpable veins  Dialysis Access: TDC-  AVG patent-  per HD RN-  pinhole area  of drainage-  dressing clean today -  if pinhole is much smaller than what it was last week   10/12/2022,7:37 AM  LOS: 2 days

## 2022-10-12 NOTE — Care Management Important Message (Signed)
Important Message  Patient Details  Name: [name] MRN: [id number] Date of Birth: Aug 27, 1951   Medicare Important Message Given:  Yes (patient not in room, copy left on bedside table)     [name] 10/12/2022, 10:53 AM

## 2022-10-12 NOTE — Progress Notes (Signed)
Report called to [facility], LPN at [facility]

## 2022-10-12 NOTE — [place] FL2 (Signed)
[place] MEDICAID FL2 LEVEL OF CARE SCREENING TOOL     IDENTIFICATION  Patient Name: [name] Birthdate: [birth year] Sex: female Admission Date (Current Location): 10/09/2022  [facility] and [place] Number:  [employer] and Address:  [facility] [address] [place]      Provider Number: [id number]  Attending Physician Name and Address:  Barton Dubois, MD  Relative Name and Phone Number:       Current Level of Care: Hospital Recommended Level of Care: [place] Prior Approval Number:    Date Approved/Denied:   PASRR Number:    Discharge Plan: SNF    Current Diagnoses: Patient Active Problem List   Diagnosis Date Noted   Acute hypoxemic respiratory failure ([place]) 10/09/2022   Volume overload 09/30/2022   CHF exacerbation ([place]) 09/29/2022   Secondary hypercoagulable state ([place]) 09/06/2022   Non-ST elevation (NSTEMI) myocardial infarction ([place]) 07/10/2022   Chronic atrial fibrillation ([place]) 07/10/2022   Anxiety    Acute blood loss anemia 06/10/2022   Permanent atrial fibrillation ([place]) 06/10/2022   CAD S/P percutaneous coronary angioplasty 06/10/2022   Heart failure with preserved ejection fraction ([place]) 06/10/2022   Symptomatic anemia 05/30/2022   GI bleed 05/30/2022   Acute on chronic respiratory failure with hypoxia ([place]) 04/26/2022   Sepsis ([place]) 04/26/2022   Paroxysmal atrial fibrillation ([place]) 04/26/2022   Multifocal pneumonia 04/25/2022   Dyslipidemia 04/04/2022   Acute exacerbation of Diastolic CHF (congestive heart failure) ([place]) 03/26/2022   Physical deconditioning 03/09/2022   Hyponatremia 03/09/2022   Pulmonary hypertension ([place]) 03/06/2022   Hypokalemia 03/05/2022   Change in bowel habit 12/16/2021   Colon cancer screening 12/16/2021   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   Family history of colonic polyps 12/16/2021   Acute CHF (congestive heart failure) ([place]) 12/06/2021    Leukocytosis 12/06/2021   Elevated brain natriuretic peptide (BNP) level 12/06/2021   GERD (gastroesophageal reflux disease) 12/06/2021   Nausea 08/26/2021   Lumbago of lumbar region with sciatica 07/21/2021   Arthropathy of lumbar facet joint 06/27/2021   Radiculopathy, lumbar region 05/05/2021   Chest pain 02/19/2021   Primary hypertension    Mixed hyperlipidemia    Hypoalbuminemia    Bruit 01/05/2020   Educated about COVID-19 virus infection 01/05/2020   Lung nodule 05/01/2018   Prolonged Q-T interval on ECG 09/06/2017   Unstable angina ([place]) 09/06/2017   GAD (generalized anxiety disorder) 03/04/2016   Leg pain 01/07/2016   Anemia of chronic disease/ESRD 12/10/2015   Dependence on renal dialysis ([place]) 12/10/2015   ESRD (end stage renal disease) ([place]) 12/10/2015   Osteopathy in diseases classified elsewhere, unspecified site 12/10/2015   Constipation 10/25/2015   COPD with acute exacerbation ([place]) 03/06/2015   Pericardial effusion 03/06/2015   Acute on chronic diastolic CHF (congestive heart failure) ([place]) 03/05/2015   PAD (peripheral artery disease) ([place]) 04/21/2014   Dyspnea 09/29/2013   Obesity (BMI 30.0-34.9) 12/05/2011   Diverticulosis 03/29/2011   Diabetic neuropathy ([place]) 03/29/2011   Esophageal stricture 03/29/2011   Vitamin D deficiency 03/29/2011   Degenerative joint disease of left shoulder 03/29/2011   Neck pain 03/29/2011   Type 2 diabetes mellitus with hyperlipidemia ([place]) 04/19/2009   Hyperlipidemia associated with type 2 diabetes mellitus ([place]) 04/19/2009   Coronary artery disease of native artery of native heart with stable angina pectoris ([place]) 04/19/2009    Orientation RESPIRATION BLADDER Height & Weight     Self, Time, Situation, Place  O2 (2L) Continent Weight:  177 lb 4 oz (80.4 kg) Height:  5\' 3"  (160 cm)  BEHAVIORAL SYMPTOMS/MOOD NEUROLOGICAL BOWEL NUTRITION STATUS      Continent Diet (see dc summary)  AMBULATORY STATUS COMMUNICATION OF NEEDS  Skin   Extensive Assist Verbally Normal                       Personal Care Assistance Level of Assistance  Bathing, Feeding, Dressing Bathing Assistance: Limited assistance Feeding assistance: Independent Dressing Assistance: Limited assistance     Functional Limitations Info  Sight, Hearing, Speech Sight Info: Adequate Hearing Info: Adequate Speech Info: Adequate    SPECIAL CARE FACTORS FREQUENCY  PT (By licensed PT), OT (By licensed OT)     PT Frequency: 5x week OT Frequency: 3x week            Contractures Contractures Info: Not present    Additional Factors Info  Code Status, Allergies Code Status Info: Full Allergies Info: Azithromycin, Ace Inhibitors, Clopidogrel Bisulfate, Codeine, Lisinopril, Penicillins           Current Medications (10/12/2022):  This is the current hospital active medication list Current Facility-Administered Medications  Medication Dose Route Frequency Provider Last Rate Last Admin   0.9 %  sodium chloride infusion  250 mL Intravenous PRN Oswald Hillock, MD       acetaminophen (TYLENOL) tablet 650 mg  650 mg Oral Q6H PRN Oswald Hillock, MD   650 mg at 10/09/22 2329   Or   acetaminophen (TYLENOL) suppository 650 mg  650 mg Rectal Q6H PRN Oswald Hillock, MD       alteplase (CATHFLO ACTIVASE) injection 2 mg  2 mg Intracatheter Once PRN [name], MD       amiodarone (PACERONE) tablet 200 mg  200 mg Oral BID Oswald Hillock, MD   200 mg at 10/11/22 2201   apixaban (ELIQUIS) tablet 5 mg  5 mg Oral BID Oswald Hillock, MD   5 mg at 10/12/22 0816   arformoterol (BROVANA) nebulizer solution 15 mcg  15 mcg Nebulization BID Orson Eva, MD   15 mcg at 10/12/22 0803   aspirin EC tablet 81 mg  81 mg Oral Daily Oswald Hillock, MD   81 mg at 10/12/22 0816   atenolol (TENORMIN) tablet 50 mg  50 mg Oral QHS Oswald Hillock, MD   50 mg at 10/11/22 2202   budesonide (PULMICORT) nebulizer solution 0.5 mg  0.5 mg Nebulization BID Tat, Shanon Brow, MD   0.5 mg at  10/12/22 0756   calcitRIOL (ROCALTROL) capsule 1.75 mcg  1.75 mcg Oral Q T,Th,Sa-HD Corliss Parish, MD       Chlorhexidine Gluconate Cloth 2 % PADS 6 each  6 each Topical Q0600 Corliss Parish, MD   6 each at 10/11/22 0852   diltiazem (CARDIZEM CD) 24 hr capsule 240 mg  240 mg Oral Daily Oswald Hillock, MD   240 mg at 10/11/22 0851   heparin injection 1,600 Units  20 Units/kg Dialysis PRN Corliss Parish, MD       insulin aspart (novoLOG) injection 0-15 Units  0-15 Units Subcutaneous TID WC Oswald Hillock, MD   8 Units at 10/12/22 0819   insulin aspart (novoLOG) injection 0-5 Units  0-5 Units Subcutaneous QHS Oswald Hillock, MD   4 Units at 10/11/22 2206   insulin glargine-yfgn (SEMGLEE) injection 32 Units  32 Units Subcutaneous QHS Barton Dubois, MD   32 Units at 10/11/22 2202  ipratropium-albuterol (DUONEB) 0.5-2.5 (3) MG/3ML nebulizer solution 3 mL  3 mL Nebulization TID Oswald Hillock, MD   3 mL at 10/12/22 0752   lanthanum (FOSRENOL) chewable tablet 1,000 mg  1,000 mg Oral TID WC Corliss Parish, MD   1,000 mg at 10/12/22 0816   loratadine (CLARITIN) tablet 10 mg  10 mg Oral Daily Oswald Hillock, MD   10 mg at 10/12/22 0816   methylPREDNISolone sodium succinate (SOLU-MEDROL) 125 mg/2 mL injection 60 mg  60 mg Intravenous Therisa Doyne, MD   60 mg at 10/12/22 0816   multivitamin (RENA-VIT) tablet 1 tablet  1 tablet Oral Daily Oswald Hillock, MD   1 tablet at 10/10/22 0844   nitroGLYCERIN (NITROSTAT) SL tablet 0.4 mg  0.4 mg Sublingual Q5 min PRN Oswald Hillock, MD       phenol (CHLORASEPTIC) mouth spray 1 spray  1 spray Mouth/Throat PRN Tat, Shanon Brow, MD   1 spray at 10/11/22 1222   polyethylene glycol (MIRALAX / GLYCOLAX) packet 17 g  17 g Oral Daily PRN Oswald Hillock, MD       rosuvastatin (CRESTOR) tablet 5 mg  5 mg Oral Daily Oswald Hillock, MD   5 mg at 10/10/22 0843   sodium chloride flush (NS) 0.9 % injection 3 mL  3 mL Intravenous Q12H Oswald Hillock, MD   3 mL at 10/11/22  2200   sodium chloride flush (NS) 0.9 % injection 3 mL  3 mL Intravenous PRN Oswald Hillock, MD         Discharge Medications: Please see discharge summary for a list of discharge medications.  Relevant Imaging Results:  Relevant Lab Results:   Additional Information SSN: [id number]  Outpatient HD [place] 6:30AM  Shade Flood, LCSW

## 2022-10-12 NOTE — TOC Transition Note (Signed)
Transition of Care [facility]) - CM/SW Discharge Note   Patient Details  Name: [name] MRN: [id number] Date of Birth: Sep 03, 1951  [facility] CM/SW Contact:  Shade Flood, LCSW Phone Number: 10/12/2022, 12:26 PM   Clinical Narrative:     Pt stable for dc to [facility] SNF today per MD. Updated Threasa Beards at [facility] and they can admit pt today. Updated pt and she remains in agreement with the dc plan.   DC clinical sent electronically. RN to call report. EMS arranged for 2pm pickup.   There are no other TOC needs for dc.  Final next level of care: Skilled Nursing Facility Barriers to Discharge: Barriers Resolved   Patient Goals and CMS Choice Patient states their goals for this hospitalization and ongoing recovery are:: rehab CMS Medicare.gov Compare Post Acute Care list provided to:: Patient Choice offered to / list presented to : Patient  Discharge Placement              Patient chooses bed at: [facility] Patient to be transferred to facility by: EMS Name of family member notified: pt notified daughter Patient and family notified of of transfer: 10/12/22  Discharge Plan and Services In-house Referral: Clinical Social Work   Post Acute Care Choice: [place]                               Social Determinants of Health ([place]) Interventions     Readmission Risk Interventions    10/02/2022   11:12 AM 05/31/2022    8:33 AM 04/28/2022    2:04 PM  Readmission Risk Prevention Plan  Transportation Screening Complete Complete Complete  Medication Review Press photographer) Complete Complete Complete  PCP or Specialist appointment within 3-5 days of discharge Not Complete  Complete  HRI or [place] Complete Complete Complete  SW Recovery Care/Counseling Consult Complete    Palliative Care Screening Not Applicable Not [place] Not Applicable Not Applicable

## 2022-10-12 NOTE — Progress Notes (Signed)
Pt back from dialysis unit via bed. Pt assisted up to bathroom by staff member x1 and FWW. Pt had small BM. Pt back to chair/recliner per her request. Pt currently eating dinner tray. Denies c/o. Aware of pending discharge.

## 2022-10-12 NOTE — Discharge Summary (Signed)
Physician Discharge Summary   Patient: [name] MRN: [id number] DOB: 04-13-1951  Admit date:     10/09/2022  Discharge date: 10/12/22  Discharge Physician: Barton Dubois   PCP: [name], MD   Recommendations at discharge:  Repeat CBC to follow hemoglobin trend/stability Continue close monitoring of patient's CBGs with further adjustment to hypoglycemia regimen as required Outpatient follow-up with urology service recommended to further evaluate hematuria and appreciated lesion on her left kidney.  Discharge Diagnoses: Acute on chronic respiratory failure with hypoxia ESRD (end stage renal disease) (HCC) Permanent atrial fibrillation (HCC) Primary hypertension Coronary artery disease of native artery of native heart with stable angina pectoris (HCC) Acute on chronic diastolic CHF (congestive heart failure) (HCC) COPD with acute exacerbation (HCC) Anemia of chronic kidney disease Class I obesity Mixed hyperlipidemia  Hospital Course: 71 year old female with a history of ESRD (TTS), diastolic CHF, permanent atrial fibrillation, coronary disease, COPD, diabetes mellitus type 2, hypertension, hyperlipidemia presenting with generalized weakness and shortness of breath.  The patient states that she was quite weak even when she left the hospital after recent hospitalization from 09/29/2022 to 10/04/2022.  During that hospitalization, the patient was treated for fluid overload resulting in acute on chronic respiratory failure.  She was initially on BiPAP.  Her hospitalization was also prolonged secondary to COPD exacerbation.  She was discharged home with prednisone.  Her left upper extremity Gore-Tex graft was evaluated by Dr. Donnetta Hutching who felt that it was healing well and should be ready to use in about 2 weeks. Patient stated that since she got home she had progressive weakness and worsening shortness of breath.  She got to the point where she had difficulty getting off the couch.  She  fell getting off the couch and hit her head on 10/07/2022.  She did not want to come to the emergency department at that time.  Her last dialysis was on 10/07/2022.  She endorses compliance with fluid restriction.  She has been staying for full sessions of dialysis.  In the last 24 hours prior to admission, the patient endorsed increasing generalized weakness and shortness of breath with associated substernal chest discomfort.  As result, she presented for further evaluation and treatment.  She denied fevers, chills, headache, hemoptysis, nausea, vomiting, diarrhea, abdominal pain. In the ED, the patient was afebrile hemodynamically stable with oxygen saturation 93% on room air.  WBC 23.7, hemoglobin 9.9, platelets 203.  Sodium 137, potassium 4.6, bicarbonate 25, serum creatinine 9.32.  AST 39, ALT 115, alk phosphatase 139, total bilirubin 1.2.  The patient was started on bronchodilators and Solu-Medrol.  Nephrology was consulted to assist with management.  Assessment and Plan: Acute on chronic respiratory failure with hypoxia -Secondary to COPD exacerbation -The patient is slightly on the hypervolemic side -Personally reviewed chest x-ray--vascular congestion -Stable on 2 L of oxygen intermittently (mainly with activity). -The patient is normally on nighttime oxygen only at 3 L at home. -Continue to wean oxygen as tolerated for saturation greater 90%   COPD exacerbation -Patient has at least 60-pack-year history tobacco, quit 2004 -Patient will complete steroids tapering and resewn home bronchodilators management. -Accessory muscles and speaking in full sentences at discharge.   ESRD -Nephrology consulted for maintenance dialysis (TTS) -Last dialysis 10/12/2022 -Next dialysis treatment 10/14/2022 as an outpatient.   Elevated troponin -Secondary to demand ischemia -09/30/2022 echo EF 55 to 60%, IV septal flattening, moderate elevated PASP -Troponin 83>> 99   Fluid overload -Chest x-ray  suggest vascular congestion -Continue fluid  removal via dialysis   Acute on chronic diastolic CHF -Primarily right heart failure -09/30/2022 echo EF 55 to 60%, interventricular septum flattening, mild MR -Low-sodium diet discussed with patient -Patient will benefit of decrease in her dry weight.   Permanent atrial fibrillation -Personally reviewed EKG--atrial fibrillation, no concerning ST-T wave change -Continue amiodarone -Continue apixaban -Continue diltiazem CD -Continue outpatient follow-up with cardiology service.   Controlled diabetes mellitus type 2 -09/30/2022 hemoglobin A1c 6.7 -NovoLog sliding scale -Anticipate elevated CBGs secondary to steroids. -Continue adjusted dose of Semglee.   Mixed hyperlipidemia -Continue statin -Heart healthy diet discussed with patient.   Intermittent hematuria -Patient states that has been present since June 2023 -CT renal protocol demonstrating no acute findings to explain patient's symptoms; left anterior midportion of the kidney demonstrating hyperdense lesion that could be hemorrhagic or proteinaceous cyst. -No obstructions appreciated. -outpatient urology referral recommended.  Class I obesity -Body mass index is 30.54 kg/m. -Low calorie diet and increase physical activity discussed with patient.  Consultants: Nephrology service Procedures performed: See below for x-ray reports Disposition: Skilled nursing facility Diet recommendation: Heart healthy modified carbohydrate diet.  DISCHARGE MEDICATION: Allergies as of 10/12/2022       Reactions   Azithromycin    Prolonged QT   Ace Inhibitors Cough   Clopidogrel Bisulfate Other (See Comments)   Sick, Headache, "Felt terrible"   Codeine Other (See Comments)   hallucinations   Lisinopril Cough   Penicillins Other (See Comments)   Unknown childhood reaction Did it involve swelling of the face/tongue/throat, SOB, or low BP? Unknown Did it involve sudden or severe  rash/hives, skin peeling, or any reaction on the inside of your mouth or nose? Unknown Did you need to seek medical attention at a hospital or doctor's office? Unknown When did it last happen?      childhood allergy If all above answers are "NO", may proceed with cephalosporin use.        Medication List     STOP taking these medications    torsemide 20 MG tablet Commonly known as: DEMADEX       TAKE these medications    acetaminophen 500 MG tablet Commonly known as: TYLENOL Take 1,000 mg by mouth every 6 (six) hours as needed for moderate pain or headache.   albuterol (2.5 MG/3ML) 0.083% nebulizer solution Commonly known as: PROVENTIL Take 3 mLs (2.5 mg total) by nebulization every 6 (six) hours as needed for wheezing or shortness of breath.   amiodarone 200 MG tablet Commonly known as: PACERONE Take 1 tablet by mouth twice a day for 2 weeks then reduce to 1 tablet daily What changed:  how much to take how to take this when to take this additional instructions   apixaban 5 MG Tabs tablet Commonly known as: ELIQUIS Take 1 tablet (5 mg total) by mouth 2 (two) times daily.   aspirin EC 81 MG tablet Take 1 tablet (81 mg total) by mouth daily. Swallow whole.   atenolol 100 MG tablet Commonly known as: TENORMIN Take 0.5 tablets (50 mg total) by mouth at bedtime.   budesonide-formoterol 160-4.5 MCG/ACT inhaler Commonly known as: SYMBICORT Inhale 2 puffs into the lungs 2 (two) times daily. What changed:  when to take this reasons to take this   calcitRIOL 0.25 MCG capsule Commonly known as: ROCALTROL Take 7 capsules (1.75 mcg total) by mouth Every Tuesday,Thursday,and Saturday with dialysis.   cetirizine 10 MG tablet Commonly known as: ZYRTEC Take 1 tablet (10 mg total) by mouth  daily as needed for allergies. What changed: when to take this   diltiazem 240 MG 24 hr capsule Commonly known as: CARDIZEM CD Take 1 capsule (240 mg total) by mouth daily.    DULoxetine 30 MG capsule Commonly known as: CYMBALTA TAKE 1 CAPSULE BY MOUTH ONCE DAILY WITH SUPPER What changed: See the new instructions.   Ferrous Fumarate 324 (106 Fe) MG Tabs tablet Commonly known as: HEMOCYTE - 106 mg FE Take 1 tablet (106 mg of iron total) by mouth daily.   FreeStyle Libre 2 Reader Kerrin Mo USE TO TEST BLOOD SUGAR CONTINUOUSLY   FreeStyle Libre 2 Sensor Misc Use multiple times daily to track glucose to prevent highs and lows as a complication of dialysis Dx:Z99.2, E11.59   gabapentin 300 MG capsule Commonly known as: NEURONTIN Take 1 capsule (300 mg total) by mouth at bedtime. What changed:  medication strength See the new instructions.   lanthanum 750 MG chewable tablet Commonly known as: FOSRENOL Chew 1,500 mg by mouth 3 (three) times daily with meals.   LUBRICATING EYE DROPS OP Place 1 drop into both eyes daily as needed (dry eyes).   multivitamin Tabs tablet Take 1 tablet by mouth daily.   nitroGLYCERIN 0.4 MG SL tablet Commonly known as: NITROSTAT Place 1 tablet (0.4 mg total) under the tongue every 5 (five) minutes as needed for chest pain.   NovoLOG FlexPen 100 UNIT/ML FlexPen Generic drug: insulin aspart Inject 1 Units into the skin 3 (three) times daily with meals. < 150: 0 units. 150-199:1 unit. 200-249: 2 units. 250-299: 3 units. 300-349: 4 units. Over 350: 5 units   ondansetron 4 MG tablet Commonly known as: ZOFRAN TAKE 1 TABLET BY MOUTH EVERY 8 HOURS AS NEEDED FOR NAUSEA AND VOMITING What changed: See the new instructions.   OneTouch Ultra test strip Generic drug: glucose blood Use to test blood sugar 3 times daily as directed. DX: E11.9   Ozempic (0.25 or 0.5 MG/DOSE) 2 MG/1.5ML Sopn Generic drug: Semaglutide(0.25 or 0.5MG/DOS) Inject 0.5 mg into the skin every 7 (seven) days. Saturday   polyethylene glycol 17 g packet Commonly known as: MIRALAX / GLYCOLAX Take 17 g by mouth daily as needed for moderate constipation or mild  constipation.   predniSONE 20 MG tablet Commonly known as: DELTASONE Take 3 tablets by mouth daily x2 day; then 2 tablets by mouth daily x2-days; then 1 tablet by mouth daily x3 days; then half tablet by mouth daily x3 days and stop prednisone. What changed:  how much to take how to take this when to take this additional instructions   rOPINIRole 1 MG tablet Commonly known as: REQUIP Take 1 mg by mouth at bedtime.   rosuvastatin 5 MG tablet Commonly known as: Crestor Take 1 tablet (5 mg total) by mouth daily. For cholesterol   Tresiba FlexTouch 100 UNIT/ML FlexTouch Pen Generic drug: insulin degludec Inject 20 Units into the skin at bedtime. Via novo nordisk patient assistance program What changed: how much to take        Contact information for follow-up providers     Stoneking, Reece Leader., MD. Schedule an appointment as soon as possible for a visit in 2 week(s).   Specialty: Urology Why: for evaluation regarding hematuria and lesion found on her left kidney. Contact information: [address] [place] [address] [phone]         [name], MD. Schedule an appointment as soon as possible for a visit in 2 week(s).  Specialty: Family Medicine Why: After discharge from the skilled nursing facility. Contact information: [place] [address] [phone]              Contact information for after-discharge care     Destination     [facility] .   Service: Skilled Nursing Contact information: [address] [place] [phone]                    Discharge Exam: Danley Danker Weights   10/09/22 1102 10/10/22 1146 10/10/22 1545  Weight: 79.4 kg 82.8 kg 78.2 kg   General exam: Alert, awake, oriented x 3; able to speak in full sentences and denying chest pain, nausea or vomiting.  Good oxygen saturation on 2 L  supplementation. Respiratory system: Improving mood bilaterally; no using accessory muscles.  Positive scattered rhonchi; no wheezing on exam. Cardiovascular system:RRR. No rubs or gallops; no JVD appreciated on exam. Gastrointestinal system: Abdomen is nondistended, soft and nontender. No organomegaly or masses felt. Normal bowel sounds heard. Central nervous system: Alert and oriented. No focal neurological deficits. Extremities: No cyanosis or clubbing. Skin: No petechiae. Psychiatry: Judgement and insight appear normal. Mood & affect appropriate.   Condition at discharge: In stable and improved condition.  The results of significant diagnostics from this hospitalization (including imaging, microbiology, ancillary and laboratory) are listed below for reference.   Imaging Studies: CT RENAL STONE STUDY  Result Date: 10/10/2022 CLINICAL DATA:  Hematuria, low abdominal pain EXAM: CT ABDOMEN AND PELVIS WITHOUT CONTRAST TECHNIQUE: Multidetector CT imaging of the abdomen and pelvis was performed following the standard protocol without IV contrast. RADIATION DOSE REDUCTION: This exam was performed according to the departmental dose-optimization program which includes automated exposure control, adjustment of the mA and/or kV according to patient size and/or use of iterative reconstruction technique. COMPARISON:  10/01/2020. FINDINGS: Lower chest: Cardiomegaly. Three-vessel coronary artery calcifications and/or stents. Small right pleural effusion and associated atelectasis or consolidation. Trace left pleural effusion. Hepatobiliary: No solid liver abnormality is seen. Excreted contrast in the gallbladder. No gallstones, gallbladder wall thickening, or biliary dilatation. Pancreas: Unremarkable. No pancreatic ductal dilatation or surrounding inflammatory changes. Spleen: Normal in size without significant abnormality. Adrenals/Urinary Tract: Adrenal glands are unremarkable. Atrophic kidneys. Small renal  vascular calcifications and/or nonobstructive calculi. No ureteral calculi or hydronephrosis. Subcentimeter hyperdense lesion of the anterior midportion of the left kidney measuring 0.7 cm (series 2, image 29), new compared to prior examination dated 10/01/2020. Bladder is unremarkable. Stomach/Bowel: Stomach is within normal limits. Appendix appears normal. No evidence of bowel wall thickening, distention, or inflammatory changes. Vascular/Lymphatic: Aortic atherosclerosis. No enlarged abdominal or pelvic lymph nodes. Reproductive: Status post hysterectomy. Other: No abdominal wall hernia or abnormality.  Trace ascites. Musculoskeletal: No acute or significant osseous findings. IMPRESSION: 1. No acute CT findings of the abdomen or pelvis to explain pain. 2. Small pleural effusions and trace ascites. 3. Atrophic kidneys. Small renal vascular calcifications and/or nonobstructive calculi. No ureteral calculi or hydronephrosis. 4. Subcentimeter hyperdense lesion of the anterior midportion of the left kidney measuring 0.7 cm , new compared to prior examination dated 10/01/2020. This is most likely a hemorrhagic or proteinaceous cyst, however a small solid renal mass is not excluded. Consider initial renal ultrasound to confirm cystic character. 5. Cardiomegaly and coronary artery disease. Aortic Atherosclerosis (ICD10-I70.0). Electronically Signed   By: Delanna Ahmadi M.D.   On: 10/10/2022 10:28   DG Chest 2  View  Result Date: 10/09/2022 CLINICAL DATA:  Weakness, possible pneumonia. History of COPD and congestive heart failure. Dialysis patient. EXAM: CHEST - 2 VIEW COMPARISON:  10/01/2022 FINDINGS: Right IJ dialysis catheter tip: Right atrium. Lower cervical plate and screw fixator. Atherosclerotic calcification of the aortic arch. Mild to moderate enlargement of the cardiopericardial silhouette with upper zone pulmonary vascular prominence suggesting pulmonary venous hypertension, but no overt edema. Lower thoracic  spondylosis. No pneumonia identified.  No blunting of the costophrenic angles. IMPRESSION: 1. Mild to moderate enlargement of the cardiopericardial silhouette with pulmonary venous hypertension but no overt edema. 2. Right IJ dialysis catheter tip: Right atrium. 3. Lower thoracic spondylosis. Electronically Signed   By: Van Clines M.D.   On: 10/09/2022 14:14   CT Head Wo Contrast  Result Date: 10/09/2022 CLINICAL DATA:  Head trauma EXAM: CT HEAD WITHOUT CONTRAST TECHNIQUE: Contiguous axial images were obtained from the base of the skull through the vertex without intravenous contrast. RADIATION DOSE REDUCTION: This exam was performed according to the departmental dose-optimization program which includes automated exposure control, adjustment of the mA and/or kV according to patient size and/or use of iterative reconstruction technique. COMPARISON:  CT dated June 14, 2022 FINDINGS: Brain: Chronic white matter ischemic change. No evidence of acute infarction, hemorrhage, hydrocephalus, extra-axial collection or mass lesion/mass effect. Vascular: No hyperdense vessel or unexpected calcification. Skull: Normal. Negative for fracture or focal lesion. Sinuses/Orbits: Mucosal thickening of the right sphenoid sinus findings can be seen the setting of chronic sinusitis. No acute abnormality. Other: None. IMPRESSION: No acute intracranial abnormality. Electronically Signed   By: Yetta Glassman M.D.   On: 10/09/2022 13:14   LONG TERM MONITOR (3-14 DAYS)  Result Date: 10/08/2022 Patch Wear Time:  6 days and 21 hours 100% atrial fibrillation burden Less than 1% ventricular ectopy No triggered episodes recorded Will Camnitz, MD   US Venous Img Upper Uni Right(DVT)  Result Date: 10/02/2022 CLINICAL DATA:  Upper extremity swelling. EXAM: RIGHT UPPER EXTREMITY VENOUS DOPPLER ULTRASOUND TECHNIQUE: Gray-scale sonography with graded compression, as well as color Doppler and duplex ultrasound were performed to  evaluate the upper extremity deep venous system from the level of the subclavian vein and including the jugular, axillary, basilic, radial, ulnar and upper cephalic vein. Spectral Doppler was utilized to evaluate flow at rest and with distal augmentation maneuvers. COMPARISON:  Chest XR, 10/02/2019. FINDINGS: VENOUS Dialysis catheter within the imaged R IJV. Otherwise normal compressibility of the RIGHT internal jugular, subclavian, axillary, cephalic, basilic, brachial, radial and ulnar veins. No filling defects to suggest DVT on grayscale or color Doppler imaging. Doppler waveforms show normal direction of venous flow, normal respiratory plasticity and response to augmentation. Limited views of the contralateral subclavian vein are unremarkable. OTHER RIGHT upper extremity arteriovenous fistula with enlargement of the RIGHT brachial vein and thrill within the axillary vein. No evidence of superficial thrombophlebitis or abnormal fluid collection. Limitations: Dressings overlying the RIGHT neck and chest limiting evaluation of the R IJV and venous confluence. IMPRESSION: Suboptimal evaluation, within these constraints; 1. No definitive evidence of DVT or superficial thrombophlebitis within the RIGHT upper extremity. 2. Ipsilateral (RIGHT) upper extremity fistula and dialysis catheter. Findings likely to represent the etiology of patient's reported upper extremity swelling. Electronically Signed   By: Michaelle Birks M.D.   On: 10/02/2022 10:39   DG CHEST PORT 1 VIEW  Result Date: 10/01/2022 CLINICAL DATA:  Shortness of breath. EXAM: PORTABLE CHEST 1 VIEW COMPARISON:  Chest x-ray 09/29/2022 FINDINGS: Right-sided central venous  catheter tip projects over the cavoatrial junction, unchanged. The heart is enlarged, unchanged. There is central pulmonary vascular congestion. There is no focal lung consolidation, pleural effusion or pneumothorax. No acute fractures are seen. Cervical spinal fusion plate is again noted.  IMPRESSION: Cardiomegaly with central pulmonary vascular congestion. Electronically Signed   By: Ronney Asters M.D.   On: 10/01/2022 16:28   ECHOCARDIOGRAM COMPLETE  Result Date: 09/30/2022    ECHOCARDIOGRAM REPORT   Patient Name:   BRETTE CAST Date of Exam: 09/30/2022 Medical Rec #:  [id number]       Height:       63.0 in Accession #:    [id number]      Weight:       178.1 lb Date of Birth:  [birth year]       BSA:          1.841 m Patient Age:    6 years        BP:           164/50 mmHg Patient Gender: F               HR:           82 bpm. Exam Location:  [facility] Procedure: 2D Echo, Cardiac Doppler and Color Doppler Indications:    Congestive Heart Failure I50.9  History:        Patient has prior history of Echocardiogram examinations, most                 recent 03/05/2022. CHF, CAD and Previous Myocardial Infarction,                 COPD, Arrythmias:Atrial Fibrillation; Risk Factors:Hypertension,                 Diabetes and Dyslipidemia. ESRD (end stage renal disease) on                 dialysis.  Sonographer:    Alvino Chapel RCS Referring Phys: [id number] ASIA B [place]  1. Left ventricular ejection fraction, by estimation, is 55 to 60%. The left ventricle has normal function. Left ventricular endocardial border not optimally defined to evaluate regional wall motion. There is moderate asymmetric left ventricular hypertrophy of the lateral segment. Left ventricular diastolic parameters are indeterminate. There is the interventricular septum is flattened in diastole ('D' shaped left ventricle), consistent with right ventricular volume overload and the interventricular septum is flattened in systole, consistent with right ventricular pressure overload.  2. Right ventricular systolic function is mildly reduced. The right ventricular size is normal. There is mildly elevated pulmonary artery systolic pressure. The estimated right ventricular systolic pressure is 65.0 mmHg.  3. Left atrial size  was moderately dilated.  4. There is no evidence of cardiac tamponade.  5. The mitral valve is degenerative. Mild mitral valve regurgitation. No evidence of mitral stenosis.  6. The aortic valve is tricuspid. There is mild calcification of the aortic valve. Aortic valve regurgitation is not visualized. Aortic valve sclerosis is present, with no evidence of aortic valve stenosis. Comparison(s): Slight increase in RVSP from 2022. FINDINGS  Left Ventricle: Left ventricular ejection fraction, by estimation, is 55 to 60%. The left ventricle has normal function. Left ventricular endocardial border not optimally defined to evaluate regional wall motion. The left ventricular internal cavity size was normal in size. There is moderate asymmetric left ventricular hypertrophy of the lateral segment. The interventricular septum is flattened in diastole ('D' shaped left ventricle), consistent  with right ventricular volume overload and the interventricular septum is flattened in systole, consistent with right ventricular pressure overload. Left ventricular diastolic parameters are indeterminate. Right Ventricle: The right ventricular size is normal. No increase in right ventricular wall thickness. Right ventricular systolic function is mildly reduced. There is mildly elevated pulmonary artery systolic pressure. The tricuspid regurgitant velocity  is 2.66 m/s, and with an assumed right atrial pressure of 15 mmHg, the estimated right ventricular systolic pressure is 34.1 mmHg. Left Atrium: Left atrial size was moderately dilated. Right Atrium: Right atrial size was normal in size. Pericardium: Trivial pericardial effusion is present. The pericardial effusion is circumferential. There is no evidence of cardiac tamponade. Mitral Valve: The mitral valve is degenerative in appearance. Mild mitral valve regurgitation. No evidence of mitral valve stenosis. Tricuspid Valve: The tricuspid valve is normal in structure. Tricuspid valve  regurgitation is mild . No evidence of tricuspid stenosis. Aortic Valve: The aortic valve is tricuspid. There is mild calcification of the aortic valve. There is mild aortic valve annular calcification. Aortic valve regurgitation is not visualized. Aortic valve sclerosis is present, with no evidence of aortic valve stenosis. Pulmonic Valve: The pulmonic valve was normal in structure. Pulmonic valve regurgitation is not visualized. No evidence of pulmonic stenosis. Aorta: The aortic root is normal in size and structure. IAS/Shunts: No atrial level shunt detected by color flow Doppler.  LEFT VENTRICLE PLAX 2D LVIDd:         4.50 cm LVIDs:         2.90 cm LV PW:         1.50 cm LV IVS:        1.20 cm LVOT diam:     1.50 cm LV SV:         29 LV SV Index:   16 LVOT Area:     1.77 cm  RIGHT VENTRICLE TAPSE (M-mode): 1.3 cm LEFT ATRIUM             Index        RIGHT ATRIUM           Index LA diam:        4.70 cm 2.55 cm/m   RA Area:     20.70 cm LA Vol (A2C):   86.9 ml 47.21 ml/m  RA Volume:   57.70 ml  31.35 ml/m LA Vol (A4C):   68.0 ml 36.94 ml/m LA Biplane Vol: 77.1 ml 41.88 ml/m  AORTIC VALVE LVOT Vmax:   93.63 cm/s LVOT Vmean:  58.233 cm/s LVOT VTI:    0.166 m  AORTA Ao Root diam: 3.00 cm MITRAL VALVE                TRICUSPID VALVE MV Area (PHT): 4.80 cm     TR Peak grad:   28.3 mmHg MV Decel Time: 158 msec     TR Vmax:        266.00 cm/s MV E velocity: 140.00 cm/s MV A velocity: 45.10 cm/s   SHUNTS MV E/A ratio:  3.10         Systemic VTI:  0.17 m                             Systemic Diam: 1.50 cm Rudean Haskell MD Electronically signed by Rudean Haskell MD Signature Date/Time: 09/30/2022/1:37:40 PM    Final    DG Chest 2 View  Result Date: 09/29/2022 CLINICAL DATA:  Chest pain EXAM:  CHEST - 2 VIEW COMPARISON:  Chest x-ray dated August 15, 2022; chest CT dated May 15, 2022 FINDINGS: Unchanged cardiomegaly. Mild diffuse bilateral interstitial opacities. Linear opacity of the right lower  lobe, likely due to atelectasis. Indeterminate focal nodular opacities seen in the right lower lobe, new when compared with prior chest x-ray. No large pleural effusion or pneumothorax. Unchanged position of right-sided dialysis catheter. IMPRESSION: 1. Mild diffuse bilateral interstitial opacities, likely due to pulmonary edema. 2. Indeterminate focal nodular opacity seen in the right lower lobe, possibly atelectasis, although given nodules were present on prior chest CT dated May 15, 2022. Recommend further evaluation with repeat chest CT to exclude enlargement. Electronically Signed   By: Yetta Glassman M.D.   On: 09/29/2022 17:53    Microbiology: Results for orders placed or performed during the hospital encounter of 09/29/22  Resp Panel by RT-PCR (Flu A&B, Covid) Anterior Nasal Swab     Status: None   Collection Time: 09/29/22  5:55 PM   Specimen: Anterior Nasal Swab  Result Value Ref Range Status   SARS Coronavirus 2 by RT PCR NEGATIVE NEGATIVE Final    Comment: (NOTE) SARS-CoV-2 target nucleic acids are NOT DETECTED.  The SARS-CoV-2 RNA is generally detectable in upper respiratory specimens during the acute phase of infection. The lowest concentration of SARS-CoV-2 viral copies this assay can detect is 138 copies/mL. A negative result does not preclude SARS-Cov-2 infection and should not be used as the sole basis for treatment or other patient management decisions. A negative result may occur with  improper specimen collection/handling, submission of specimen other than nasopharyngeal swab, presence of viral mutation(s) within the areas targeted by this assay, and inadequate number of viral copies(<138 copies/mL). A negative result must be combined with clinical observations, patient history, and epidemiological information. The expected result is Negative.  Fact Sheet for Patients:  EntrepreneurPulse.com.au  Fact Sheet for Healthcare Providers:   IncredibleEmployment.be  This test is no t yet approved or cleared by the [place] FDA and  has been authorized for detection and/or diagnosis of SARS-CoV-2 by FDA under an Emergency Use Authorization (EUA). This EUA will remain  in effect (meaning this test can be used) for the duration of the COVID-19 declaration under Section 564(b)(1) of the Act, 21 U.S.C.section 360bbb-3(b)(1), unless the authorization is terminated  or revoked sooner.       Influenza A by PCR NEGATIVE NEGATIVE Final   Influenza B by PCR NEGATIVE NEGATIVE Final    Comment: (NOTE) The Xpert Xpress SARS-CoV-2/FLU/RSV plus assay is intended as an aid in the diagnosis of influenza from Nasopharyngeal swab specimens and should not be used as a sole basis for treatment. Nasal washings and aspirates are unacceptable for Xpert Xpress SARS-CoV-2/FLU/RSV testing.  Fact Sheet for Patients: EntrepreneurPulse.com.au  Fact Sheet for Healthcare Providers: IncredibleEmployment.be  This test is not yet approved or cleared by the [place] FDA and has been authorized for detection and/or diagnosis of SARS-CoV-2 by FDA under an Emergency Use Authorization (EUA). This EUA will remain in effect (meaning this test can be used) for the duration of the COVID-19 declaration under Section 564(b)(1) of the Act, 21 U.S.C. section 360bbb-3(b)(1), unless the authorization is terminated or revoked.  Performed at [facility], [address], [place], [place] [address]   Culture, Respiratory w Gram Stain     Status: None   Collection Time: 09/29/22  7:55 PM   Specimen: SPU  Result Value Ref Range Status   Specimen Description  Final    SPUTUM Performed at [facility], [address] [place], [place] [address]    Special Requests   Final    NONE Performed at [facility], [address] [place], [place] [address]    Gram Stain   Final    FEW WBC PRESENT,  PREDOMINANTLY MONONUCLEAR FEW GRAM NEGATIVE RODS FEW GRAM POSITIVE COCCI IN PAIRS FEW SQUAMOUS EPITHELIAL CELLS PRESENT    Culture   Final    MODERATE Normal respiratory flora-no Staph aureus or Pseudomonas seen Performed at [facility], [address] [place], [place] [address]    Report Status 10/02/2022 FINAL  Final  MRSA Next Gen by PCR, Nasal     Status: Abnormal   Collection Time: 09/29/22 11:29 PM   Specimen: Nasal Mucosa; Nasal Swab  Result Value Ref Range Status   MRSA by PCR Next Gen DETECTED (A) NOT DETECTED Final    Comment: RESULT CALLED TO, READ BACK BY AND VERIFIED WITH: Declercq L @ [id number] ON [id number] BY HENDERSON L (NOTE) The GeneXpert MRSA Assay (FDA approved for NASAL specimens only), is one component of a comprehensive MRSA colonization surveillance program. It is not intended to diagnose MRSA infection nor to guide or monitor treatment for MRSA infections. Test performance is not FDA approved in patients less than 67 years old. Performed at [facility], [address], [place],  [address]     Labs: CBC: Recent Labs  Lab 10/09/22 1254 10/10/22 1201  WBC 23.7* 22.2*  NEUTROABS 20.5*  --   HGB 11.9* 11.5*  HCT 36.7 35.2*  MCV 86.2 86.3  PLT 203 654   Basic Metabolic Panel: Recent Labs  Lab 10/09/22 1254 10/10/22 1201  NA 137 133*  K 4.6 6.2*  CL 97* 95*  CO2 25 17*  GLUCOSE 271* 358*  BUN 80* 103*  CREATININE 9.32* 10.76*  CALCIUM 10.0 9.2   Liver Function Tests: Recent Labs  Lab 10/09/22 1254 10/10/22 1201  AST 39 19  ALT 115* 79*  ALKPHOS 139* 79  BILITOT 1.2 1.3*  PROT 6.6 6.1*  ALBUMIN 3.7 3.4*   CBG: Recent Labs  Lab 10/11/22 0736 10/11/22 1142 10/11/22 1629 10/11/22 2027 10/12/22 0749  GLUCAP 328* 416* 251* 336* 271*    Discharge time spent: greater than 30 minutes.  Signed: Barton Dubois, MD [facility] 10/12/2022

## 2022-10-14 DIAGNOSIS — N186 End stage renal disease: Secondary | ICD-10-CM | POA: Diagnosis not present

## 2022-10-14 DIAGNOSIS — N250 Renal osteodystrophy: Secondary | ICD-10-CM | POA: Diagnosis not present

## 2022-10-14 DIAGNOSIS — T827XXA Infection and inflammatory reaction due to other cardiac and vascular devices, implants and grafts, initial encounter: Secondary | ICD-10-CM | POA: Diagnosis not present

## 2022-10-14 DIAGNOSIS — N2581 Secondary hyperparathyroidism of renal origin: Secondary | ICD-10-CM | POA: Diagnosis not present

## 2022-10-14 DIAGNOSIS — D509 Iron deficiency anemia, unspecified: Secondary | ICD-10-CM | POA: Diagnosis not present

## 2022-10-14 DIAGNOSIS — Z992 Dependence on renal dialysis: Secondary | ICD-10-CM | POA: Diagnosis not present

## 2022-10-16 DIAGNOSIS — H04123 Dry eye syndrome of bilateral lacrimal glands: Secondary | ICD-10-CM | POA: Diagnosis not present

## 2022-10-16 DIAGNOSIS — H40013 Open angle with borderline findings, low risk, bilateral: Secondary | ICD-10-CM | POA: Diagnosis not present

## 2022-10-16 DIAGNOSIS — E119 Type 2 diabetes mellitus without complications: Secondary | ICD-10-CM | POA: Diagnosis not present

## 2022-10-17 DIAGNOSIS — N186 End stage renal disease: Secondary | ICD-10-CM | POA: Diagnosis not present

## 2022-10-17 DIAGNOSIS — T827XXA Infection and inflammatory reaction due to other cardiac and vascular devices, implants and grafts, initial encounter: Secondary | ICD-10-CM | POA: Diagnosis not present

## 2022-10-17 DIAGNOSIS — D509 Iron deficiency anemia, unspecified: Secondary | ICD-10-CM | POA: Diagnosis not present

## 2022-10-17 DIAGNOSIS — Z992 Dependence on renal dialysis: Secondary | ICD-10-CM | POA: Diagnosis not present

## 2022-10-17 DIAGNOSIS — N250 Renal osteodystrophy: Secondary | ICD-10-CM | POA: Diagnosis not present

## 2022-10-17 DIAGNOSIS — N2581 Secondary hyperparathyroidism of renal origin: Secondary | ICD-10-CM | POA: Diagnosis not present

## 2022-10-19 ENCOUNTER — Ambulatory Visit: Payer: Medicare Other | Admitting: Urology

## 2022-10-19 DIAGNOSIS — T827XXA Infection and inflammatory reaction due to other cardiac and vascular devices, implants and grafts, initial encounter: Secondary | ICD-10-CM | POA: Diagnosis not present

## 2022-10-19 DIAGNOSIS — Z992 Dependence on renal dialysis: Secondary | ICD-10-CM | POA: Diagnosis not present

## 2022-10-19 DIAGNOSIS — N186 End stage renal disease: Secondary | ICD-10-CM | POA: Diagnosis not present

## 2022-10-19 DIAGNOSIS — D509 Iron deficiency anemia, unspecified: Secondary | ICD-10-CM | POA: Diagnosis not present

## 2022-10-19 DIAGNOSIS — N2581 Secondary hyperparathyroidism of renal origin: Secondary | ICD-10-CM | POA: Diagnosis not present

## 2022-10-19 DIAGNOSIS — N250 Renal osteodystrophy: Secondary | ICD-10-CM | POA: Diagnosis not present

## 2022-10-20 ENCOUNTER — Other Ambulatory Visit: Payer: Self-pay | Admitting: *Deleted

## 2022-10-20 NOTE — Patient Outreach (Signed)
[name] resides in [facility] [place]. She utilized the [facility] 3-day SNF waiver for admission. Screening for potential [facility] care coordination as benefit of insurance plan and PCP.   Secure communication sent to SNF social worker to inquire about transition plans.   Will continue to follow.    Marthenia Rolling, MSN, RN,BSN [place] Acute Care Coordinator [phone] (Direct dial)

## 2022-10-20 NOTE — Patient Outreach (Signed)
[place] Coordinator follow up.   Update received from [place], [facility] social worker. Mrs. Hooley's transition plan is to return home with mother.   Will continue to follow for potential [facility] care coordination needs as benefit of insurance plan and PCP.   Marthenia Rolling, MSN, RN,BSN [place] Acute Care Coordinator [phone] (Direct dial)

## 2022-10-21 DIAGNOSIS — N186 End stage renal disease: Secondary | ICD-10-CM | POA: Diagnosis not present

## 2022-10-21 DIAGNOSIS — Z992 Dependence on renal dialysis: Secondary | ICD-10-CM | POA: Diagnosis not present

## 2022-10-21 DIAGNOSIS — N250 Renal osteodystrophy: Secondary | ICD-10-CM | POA: Diagnosis not present

## 2022-10-21 DIAGNOSIS — N2581 Secondary hyperparathyroidism of renal origin: Secondary | ICD-10-CM | POA: Diagnosis not present

## 2022-10-21 DIAGNOSIS — T827XXA Infection and inflammatory reaction due to other cardiac and vascular devices, implants and grafts, initial encounter: Secondary | ICD-10-CM | POA: Diagnosis not present

## 2022-10-21 DIAGNOSIS — D509 Iron deficiency anemia, unspecified: Secondary | ICD-10-CM | POA: Diagnosis not present

## 2022-10-25 ENCOUNTER — Ambulatory Visit: Payer: Medicare Other | Admitting: Vascular Surgery

## 2022-10-30 ENCOUNTER — Ambulatory Visit: Payer: Medicare Other | Admitting: Urology

## 2022-11-03 DEATH — deceased

## 2023-04-18 IMAGING — DX DG CHEST 1V PORT
1 series · 1 of 1 positions shown · non-contrast
Comparison: Chest x-ray 02/28/2022.

CLINICAL DATA: Shortness of breath.

EXAM:
PORTABLE CHEST 1 VIEW

[chest]
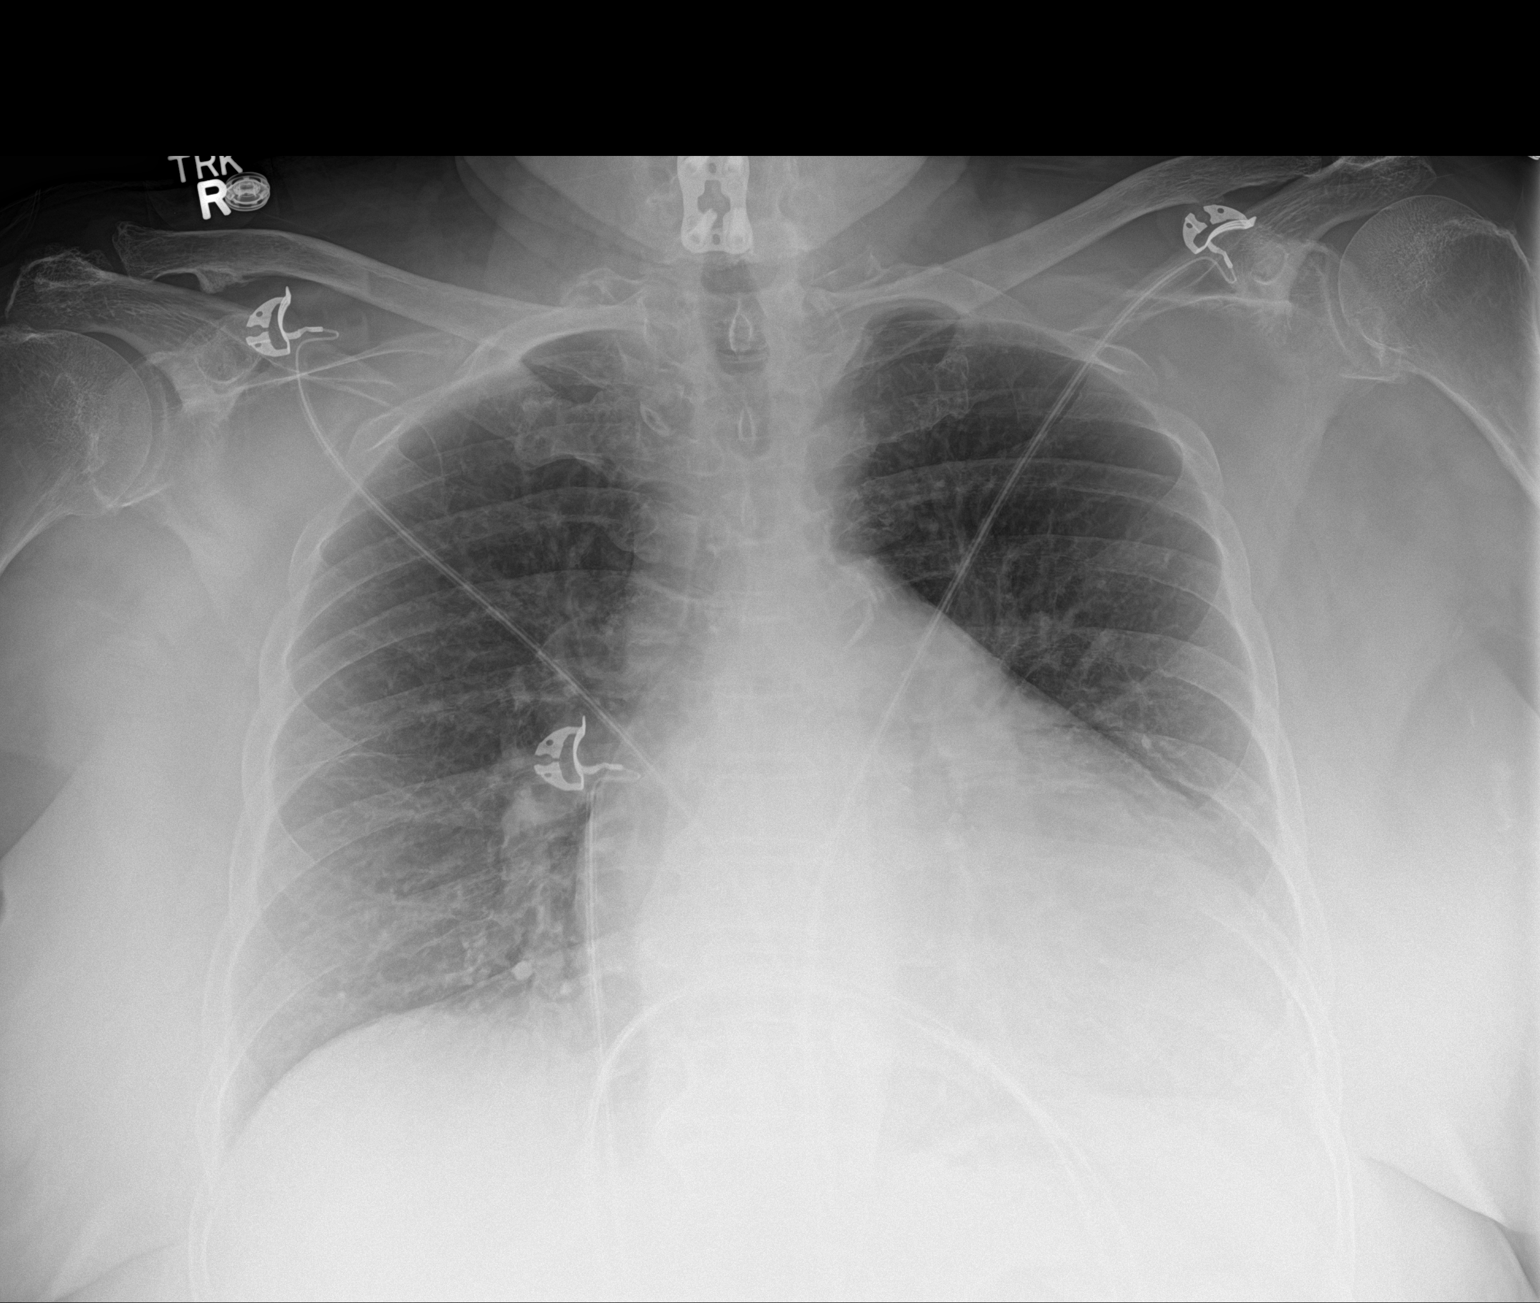

[1 of 1 positions shown; findings below may reference images not displayed]

FINDINGS: The heart is enlarged. There is no focal lung infiltrate, pleural
effusion or pneumothorax. No acute fractures are seen. Cervical
spinal fusion plate is present.
IMPRESSION: 1. No acute cardiopulmonary process.
2. Stable cardiomegaly.

## 2023-04-22 IMAGING — DX DG CHEST 2V
2 series · 2 of 2 positions shown · non-contrast
Comparison: Chest x-ray 03/13/2022.

CLINICAL DATA: Chest pain.

EXAM:
CHEST - 2 VIEW

[chest pa]
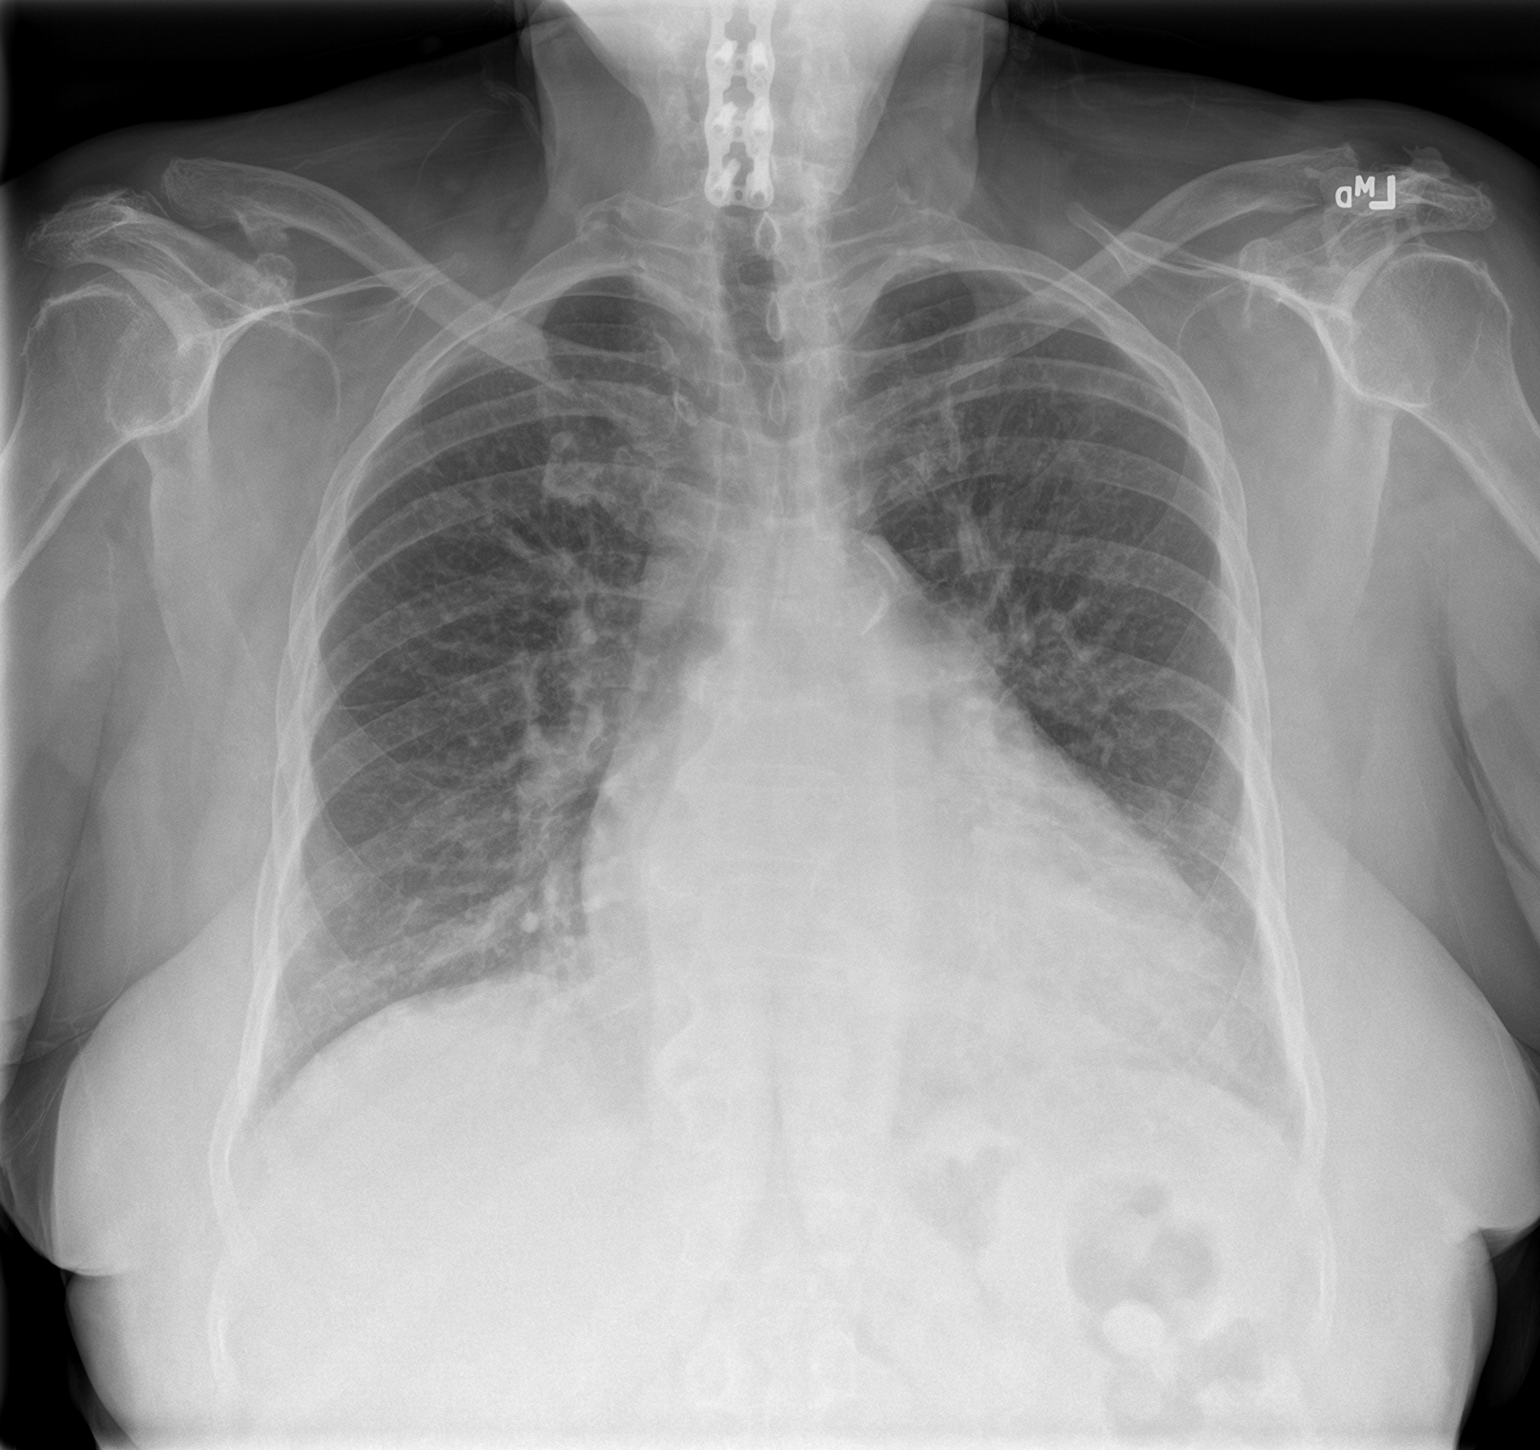

[chest lat]
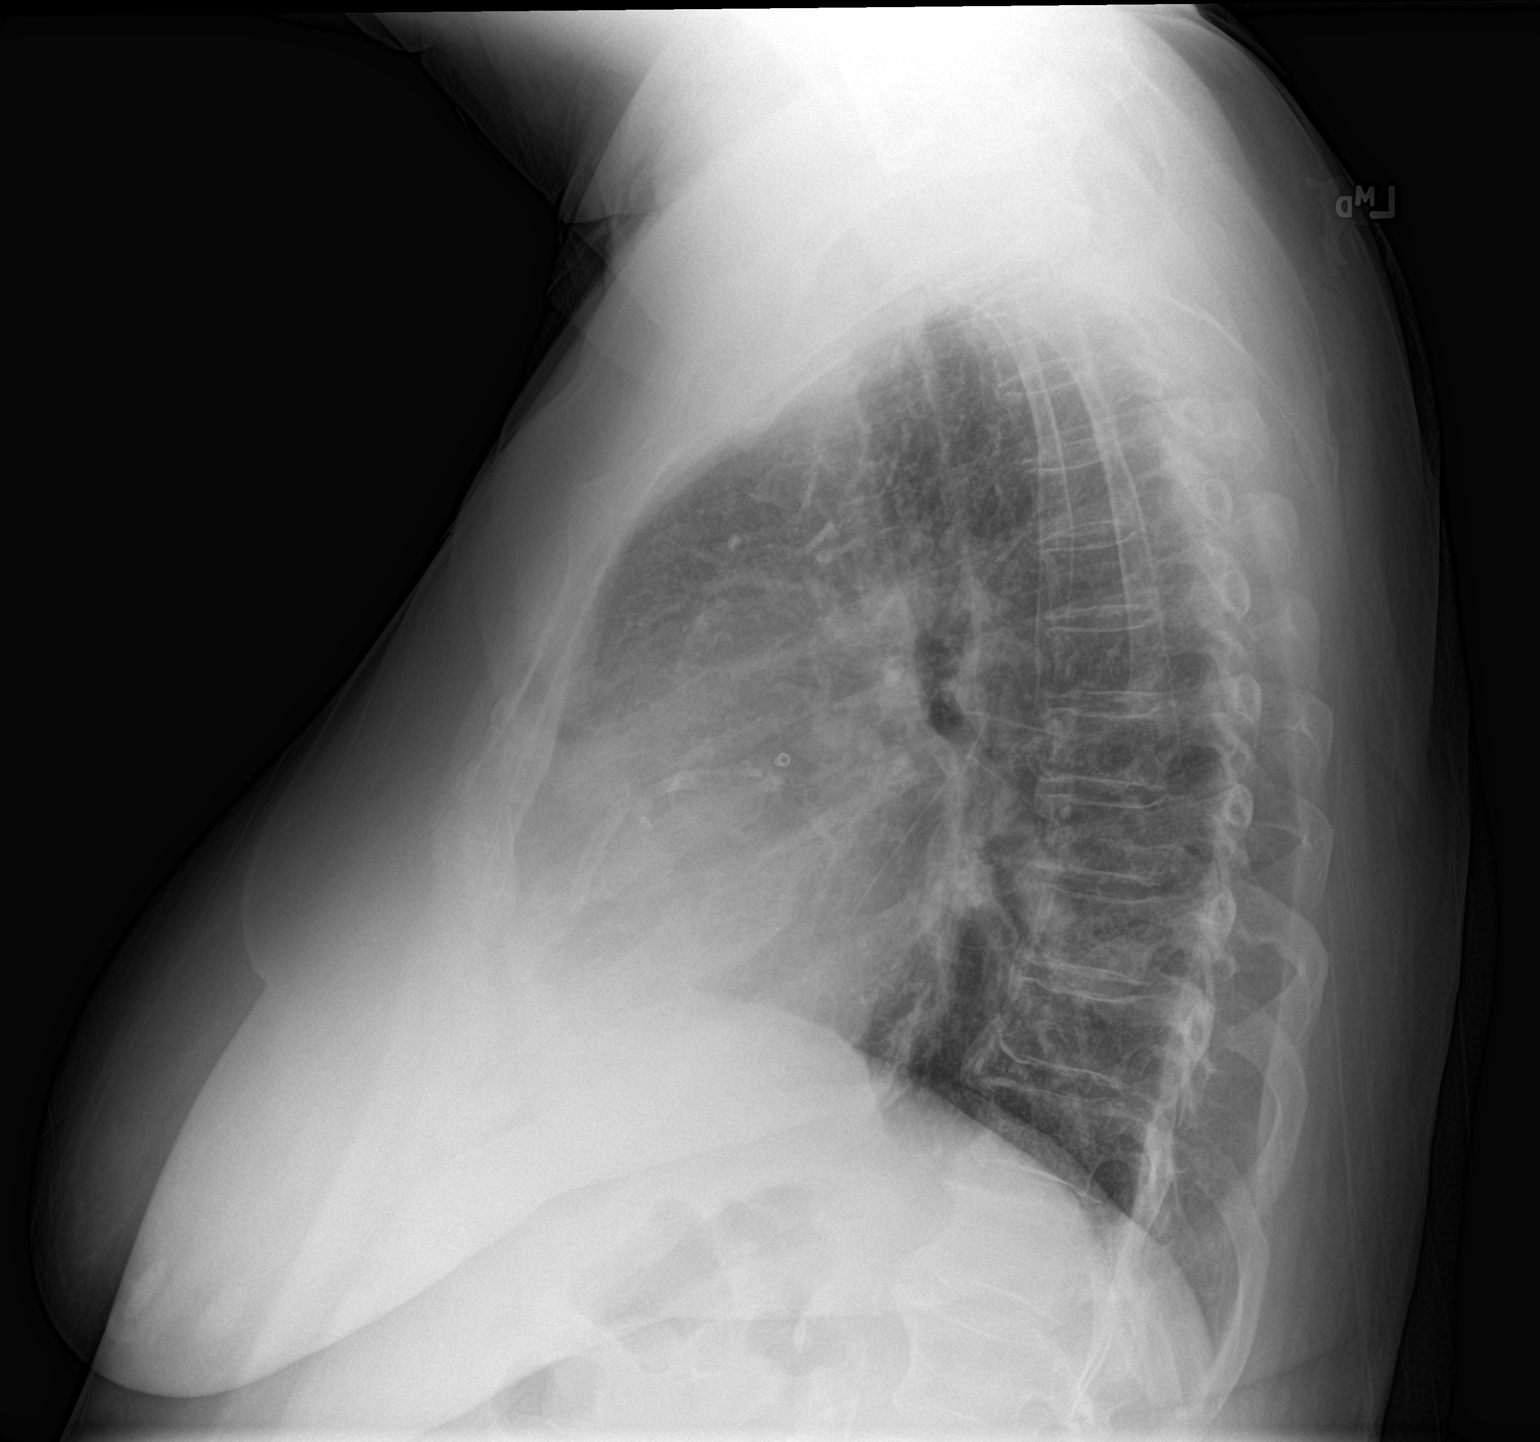

[2 of 2 positions shown; findings below may reference images not displayed]

FINDINGS: Heart is enlarged, unchanged. There is central pulmonary vascular
congestion. There is no lung consolidation, pleural effusion or
pneumothorax. No acute fractures are seen. Cervical spinal fusion
plate is present.
IMPRESSION: 1. Cardiomegaly with central pulmonary vascular congestion.
